# Patient Record
Sex: Female | Born: 1950
Health system: Southern US, Community
[De-identification: ages and names within clinical notes are randomized; demographics above are authoritative.]

## PROBLEM LIST (undated history)

## (undated) DIAGNOSIS — M5030 Other cervical disc degeneration, unspecified cervical region: Secondary | ICD-10-CM

## (undated) DIAGNOSIS — T8859XA Other complications of anesthesia, initial encounter: Secondary | ICD-10-CM

## (undated) DIAGNOSIS — E119 Type 2 diabetes mellitus without complications: Secondary | ICD-10-CM

## (undated) DIAGNOSIS — Z8619 Personal history of other infectious and parasitic diseases: Secondary | ICD-10-CM

## (undated) DIAGNOSIS — Z794 Long term (current) use of insulin: Secondary | ICD-10-CM

## (undated) DIAGNOSIS — G629 Polyneuropathy, unspecified: Secondary | ICD-10-CM

## (undated) DIAGNOSIS — D649 Anemia, unspecified: Secondary | ICD-10-CM

## (undated) DIAGNOSIS — Z9221 Personal history of antineoplastic chemotherapy: Secondary | ICD-10-CM

## (undated) DIAGNOSIS — I5181 Takotsubo syndrome: Secondary | ICD-10-CM

## (undated) DIAGNOSIS — F1193 Opioid use, unspecified with withdrawal: Secondary | ICD-10-CM

## (undated) DIAGNOSIS — I5030 Unspecified diastolic (congestive) heart failure: Secondary | ICD-10-CM

## (undated) DIAGNOSIS — M1990 Unspecified osteoarthritis, unspecified site: Secondary | ICD-10-CM

## (undated) DIAGNOSIS — F32A Depression, unspecified: Secondary | ICD-10-CM

## (undated) DIAGNOSIS — I6789 Other cerebrovascular disease: Secondary | ICD-10-CM

## (undated) DIAGNOSIS — R0601 Orthopnea: Secondary | ICD-10-CM

## (undated) DIAGNOSIS — N189 Chronic kidney disease, unspecified: Secondary | ICD-10-CM

## (undated) DIAGNOSIS — Z923 Personal history of irradiation: Secondary | ICD-10-CM

## (undated) DIAGNOSIS — M545 Low back pain, unspecified: Secondary | ICD-10-CM

## (undated) DIAGNOSIS — I639 Cerebral infarction, unspecified: Secondary | ICD-10-CM

## (undated) DIAGNOSIS — M419 Scoliosis, unspecified: Secondary | ICD-10-CM

## (undated) DIAGNOSIS — K5792 Diverticulitis of intestine, part unspecified, without perforation or abscess without bleeding: Secondary | ICD-10-CM

## (undated) DIAGNOSIS — J449 Chronic obstructive pulmonary disease, unspecified: Secondary | ICD-10-CM

## (undated) DIAGNOSIS — I509 Heart failure, unspecified: Secondary | ICD-10-CM

## (undated) DIAGNOSIS — Z9981 Dependence on supplemental oxygen: Secondary | ICD-10-CM

## (undated) DIAGNOSIS — K219 Gastro-esophageal reflux disease without esophagitis: Secondary | ICD-10-CM

## (undated) DIAGNOSIS — E7800 Pure hypercholesterolemia, unspecified: Secondary | ICD-10-CM

## (undated) DIAGNOSIS — N809 Endometriosis, unspecified: Secondary | ICD-10-CM

## (undated) DIAGNOSIS — G4730 Sleep apnea, unspecified: Secondary | ICD-10-CM

## (undated) DIAGNOSIS — F13239 Sedative, hypnotic or anxiolytic dependence with withdrawal, unspecified: Secondary | ICD-10-CM

## (undated) DIAGNOSIS — J40 Bronchitis, not specified as acute or chronic: Secondary | ICD-10-CM

## (undated) DIAGNOSIS — I10 Essential (primary) hypertension: Secondary | ICD-10-CM

## (undated) DIAGNOSIS — J189 Pneumonia, unspecified organism: Secondary | ICD-10-CM

## (undated) DIAGNOSIS — I4891 Unspecified atrial fibrillation: Secondary | ICD-10-CM

## (undated) DIAGNOSIS — K589 Irritable bowel syndrome without diarrhea: Secondary | ICD-10-CM

## (undated) DIAGNOSIS — J45909 Unspecified asthma, uncomplicated: Secondary | ICD-10-CM

## (undated) DIAGNOSIS — M5382 Other specified dorsopathies, cervical region: Secondary | ICD-10-CM

## (undated) DIAGNOSIS — G894 Chronic pain syndrome: Secondary | ICD-10-CM

## (undated) DIAGNOSIS — F13939 Sedative, hypnotic or anxiolytic use, unspecified with withdrawal, unspecified: Secondary | ICD-10-CM

## (undated) DIAGNOSIS — F419 Anxiety disorder, unspecified: Secondary | ICD-10-CM

## (undated) DIAGNOSIS — R0600 Dyspnea, unspecified: Secondary | ICD-10-CM

## (undated) DIAGNOSIS — C50919 Malignant neoplasm of unspecified site of unspecified female breast: Secondary | ICD-10-CM

## (undated) DIAGNOSIS — Z7901 Long term (current) use of anticoagulants: Secondary | ICD-10-CM

## (undated) DIAGNOSIS — N183 Chronic kidney disease, stage 3 unspecified: Secondary | ICD-10-CM

## (undated) DIAGNOSIS — I499 Cardiac arrhythmia, unspecified: Secondary | ICD-10-CM

## (undated) DIAGNOSIS — I480 Paroxysmal atrial fibrillation: Secondary | ICD-10-CM

## (undated) DIAGNOSIS — I700 Atherosclerosis of aorta: Secondary | ICD-10-CM

## (undated) HISTORY — PX: BACK SURGERY: SHX140

## (undated) HISTORY — DX: Irritable bowel syndrome, unspecified: K58.9

## (undated) HISTORY — PX: COCCYX REMOVAL: SHX600

## (undated) HISTORY — DX: Unspecified atrial fibrillation: I48.91

## (undated) HISTORY — DX: Unspecified osteoarthritis, unspecified site: M19.90

## (undated) HISTORY — DX: Endometriosis, unspecified: N80.9

## (undated) HISTORY — DX: Type 2 diabetes mellitus without complications: E11.9

## (undated) HISTORY — PX: INTRATHECAL PUMP IMPLANT: SHX6809

## (undated) HISTORY — DX: Gastro-esophageal reflux disease without esophagitis: K21.9

## (undated) HISTORY — PX: CARDIAC CATHETERIZATION: SHX172

## (undated) HISTORY — PX: TOTAL HIP ARTHROPLASTY: SHX124

## (undated) HISTORY — PX: ELBOW ARTHROSCOPY WITH TENDON RECONSTRUCTION: SHX5616

## (undated) HISTORY — PX: BREAST SURGERY: SHX581

## (undated) HISTORY — PX: PLANTAR FASCIA RELEASE: SHX2239

## (undated) HISTORY — PX: TRIGGER FINGER RELEASE: SHX641

---
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
## 1985-08-04 HISTORY — PX: ABDOMINAL HYSTERECTOMY: SHX81

## 1996-08-04 DIAGNOSIS — C50919 Malignant neoplasm of unspecified site of unspecified female breast: Secondary | ICD-10-CM

## 1996-08-04 DIAGNOSIS — C50911 Malignant neoplasm of unspecified site of right female breast: Secondary | ICD-10-CM

## 1996-08-04 HISTORY — DX: Malignant neoplasm of unspecified site of unspecified female breast: C50.919

## 1996-08-04 HISTORY — DX: Malignant neoplasm of unspecified site of right female breast: C50.911

## 1997-06-04 HISTORY — PX: MASTECTOMY: SHX3

## 2003-12-09 ENCOUNTER — Other Ambulatory Visit: Payer: Self-pay

## 2004-04-21 ENCOUNTER — Other Ambulatory Visit: Payer: Self-pay

## 2004-05-29 ENCOUNTER — Ambulatory Visit: Payer: Self-pay | Admitting: Physician Assistant

## 2004-06-13 ENCOUNTER — Ambulatory Visit: Payer: Self-pay | Admitting: Pain Medicine

## 2004-07-22 ENCOUNTER — Ambulatory Visit: Payer: Self-pay | Admitting: Pain Medicine

## 2004-07-22 ENCOUNTER — Inpatient Hospital Stay: Payer: Self-pay | Admitting: Pain Medicine

## 2004-08-01 ENCOUNTER — Ambulatory Visit: Payer: Self-pay | Admitting: Physician Assistant

## 2004-08-12 ENCOUNTER — Ambulatory Visit: Payer: Self-pay | Admitting: Pain Medicine

## 2004-09-05 ENCOUNTER — Ambulatory Visit: Payer: Self-pay | Admitting: Internal Medicine

## 2004-09-18 ENCOUNTER — Ambulatory Visit: Payer: Self-pay | Admitting: Pain Medicine

## 2004-09-20 ENCOUNTER — Ambulatory Visit: Payer: Self-pay | Admitting: Physician Assistant

## 2004-09-30 ENCOUNTER — Ambulatory Visit: Payer: Self-pay | Admitting: Pain Medicine

## 2004-10-14 ENCOUNTER — Ambulatory Visit: Payer: Self-pay | Admitting: Pain Medicine

## 2004-10-17 ENCOUNTER — Ambulatory Visit: Payer: Self-pay | Admitting: Physician Assistant

## 2004-10-23 ENCOUNTER — Ambulatory Visit: Payer: Self-pay | Admitting: Internal Medicine

## 2004-10-23 ENCOUNTER — Ambulatory Visit: Payer: Self-pay | Admitting: Pain Medicine

## 2004-11-05 ENCOUNTER — Ambulatory Visit: Payer: Self-pay | Admitting: Physician Assistant

## 2004-12-10 ENCOUNTER — Ambulatory Visit: Payer: Self-pay | Admitting: Physician Assistant

## 2004-12-12 ENCOUNTER — Ambulatory Visit: Payer: Self-pay | Admitting: Pain Medicine

## 2004-12-26 ENCOUNTER — Ambulatory Visit: Payer: Self-pay | Admitting: Physician Assistant

## 2005-01-29 ENCOUNTER — Ambulatory Visit: Payer: Self-pay | Admitting: Physician Assistant

## 2005-02-21 ENCOUNTER — Ambulatory Visit: Payer: Self-pay | Admitting: Physician Assistant

## 2005-03-04 ENCOUNTER — Ambulatory Visit: Payer: Self-pay | Admitting: Internal Medicine

## 2005-03-07 ENCOUNTER — Ambulatory Visit: Payer: Self-pay | Admitting: Physician Assistant

## 2005-04-08 ENCOUNTER — Ambulatory Visit: Payer: Self-pay | Admitting: Physician Assistant

## 2005-04-21 ENCOUNTER — Ambulatory Visit: Payer: Self-pay | Admitting: Pain Medicine

## 2005-05-06 ENCOUNTER — Ambulatory Visit: Payer: Self-pay | Admitting: Physician Assistant

## 2005-05-22 ENCOUNTER — Ambulatory Visit: Payer: Self-pay | Admitting: Physician Assistant

## 2005-07-03 ENCOUNTER — Ambulatory Visit: Payer: Self-pay | Admitting: Physician Assistant

## 2005-07-21 ENCOUNTER — Inpatient Hospital Stay: Payer: Self-pay | Admitting: Internal Medicine

## 2005-07-21 ENCOUNTER — Other Ambulatory Visit: Payer: Self-pay

## 2005-07-21 ENCOUNTER — Emergency Department: Payer: Self-pay | Admitting: Emergency Medicine

## 2005-08-05 ENCOUNTER — Ambulatory Visit: Payer: Self-pay | Admitting: Physician Assistant

## 2005-09-09 ENCOUNTER — Ambulatory Visit: Payer: Self-pay | Admitting: Physician Assistant

## 2005-09-10 ENCOUNTER — Ambulatory Visit: Payer: Self-pay | Admitting: Internal Medicine

## 2005-09-18 ENCOUNTER — Ambulatory Visit: Payer: Self-pay | Admitting: Physician Assistant

## 2005-10-08 ENCOUNTER — Ambulatory Visit: Payer: Self-pay | Admitting: Physician Assistant

## 2005-10-20 ENCOUNTER — Ambulatory Visit: Payer: Self-pay | Admitting: Internal Medicine

## 2005-11-05 ENCOUNTER — Ambulatory Visit: Payer: Self-pay | Admitting: Physician Assistant

## 2005-11-10 ENCOUNTER — Ambulatory Visit: Payer: Self-pay

## 2005-11-14 ENCOUNTER — Ambulatory Visit: Payer: Self-pay | Admitting: Internal Medicine

## 2005-11-26 ENCOUNTER — Ambulatory Visit: Payer: Self-pay | Admitting: Pain Medicine

## 2005-12-03 ENCOUNTER — Ambulatory Visit: Payer: Self-pay | Admitting: Pain Medicine

## 2006-01-14 ENCOUNTER — Ambulatory Visit: Payer: Self-pay | Admitting: Physician Assistant

## 2006-01-19 ENCOUNTER — Ambulatory Visit: Payer: Self-pay | Admitting: Pain Medicine

## 2006-04-20 ENCOUNTER — Ambulatory Visit: Payer: Self-pay | Admitting: Physician Assistant

## 2006-04-24 ENCOUNTER — Ambulatory Visit: Payer: Self-pay | Admitting: Internal Medicine

## 2006-05-04 ENCOUNTER — Ambulatory Visit: Payer: Self-pay | Admitting: Internal Medicine

## 2006-07-22 ENCOUNTER — Ambulatory Visit: Payer: Self-pay | Admitting: Physician Assistant

## 2006-09-03 ENCOUNTER — Ambulatory Visit: Payer: Self-pay | Admitting: Physician Assistant

## 2006-09-07 ENCOUNTER — Ambulatory Visit: Payer: Self-pay | Admitting: Internal Medicine

## 2006-10-26 ENCOUNTER — Ambulatory Visit: Payer: Self-pay | Admitting: Internal Medicine

## 2006-10-26 ENCOUNTER — Ambulatory Visit: Payer: Self-pay | Admitting: Physician Assistant

## 2006-10-27 ENCOUNTER — Ambulatory Visit: Payer: Self-pay | Admitting: Specialist

## 2006-10-27 ENCOUNTER — Other Ambulatory Visit: Payer: Self-pay

## 2006-11-06 ENCOUNTER — Ambulatory Visit: Payer: Self-pay | Admitting: Specialist

## 2007-01-01 ENCOUNTER — Ambulatory Visit: Payer: Self-pay | Admitting: Pain Medicine

## 2007-01-01 ENCOUNTER — Ambulatory Visit: Payer: Self-pay | Admitting: Physician Assistant

## 2007-01-03 ENCOUNTER — Ambulatory Visit: Payer: Self-pay | Admitting: Internal Medicine

## 2007-01-04 ENCOUNTER — Ambulatory Visit: Payer: Self-pay | Admitting: Internal Medicine

## 2007-01-07 ENCOUNTER — Ambulatory Visit: Payer: Self-pay | Admitting: Pain Medicine

## 2007-01-11 ENCOUNTER — Ambulatory Visit: Payer: Self-pay | Admitting: Pain Medicine

## 2007-01-13 ENCOUNTER — Ambulatory Visit: Payer: Self-pay | Admitting: Gastroenterology

## 2007-01-27 ENCOUNTER — Ambulatory Visit: Payer: Self-pay | Admitting: Physician Assistant

## 2007-02-02 ENCOUNTER — Ambulatory Visit: Payer: Self-pay | Admitting: Internal Medicine

## 2007-04-05 ENCOUNTER — Ambulatory Visit: Payer: Self-pay | Admitting: Internal Medicine

## 2007-05-03 ENCOUNTER — Ambulatory Visit: Payer: Self-pay | Admitting: Physician Assistant

## 2007-05-04 ENCOUNTER — Ambulatory Visit: Payer: Self-pay | Admitting: Internal Medicine

## 2007-08-05 ENCOUNTER — Ambulatory Visit: Payer: Self-pay | Admitting: Internal Medicine

## 2007-08-09 ENCOUNTER — Ambulatory Visit: Payer: Self-pay | Admitting: Physician Assistant

## 2007-08-18 ENCOUNTER — Ambulatory Visit: Payer: Self-pay | Admitting: Pain Medicine

## 2007-08-30 ENCOUNTER — Ambulatory Visit: Payer: Self-pay | Admitting: Internal Medicine

## 2007-09-05 ENCOUNTER — Ambulatory Visit: Payer: Self-pay | Admitting: Internal Medicine

## 2007-11-10 ENCOUNTER — Ambulatory Visit: Payer: Self-pay | Admitting: Physician Assistant

## 2007-11-15 ENCOUNTER — Ambulatory Visit: Payer: Self-pay | Admitting: Internal Medicine

## 2008-01-03 ENCOUNTER — Ambulatory Visit: Payer: Self-pay | Admitting: Internal Medicine

## 2008-01-07 ENCOUNTER — Ambulatory Visit: Payer: Self-pay | Admitting: Internal Medicine

## 2008-02-02 ENCOUNTER — Ambulatory Visit: Payer: Self-pay | Admitting: Internal Medicine

## 2008-02-03 ENCOUNTER — Ambulatory Visit: Payer: Self-pay | Admitting: Physician Assistant

## 2008-02-21 ENCOUNTER — Ambulatory Visit: Payer: Self-pay | Admitting: Family Medicine

## 2008-02-22 ENCOUNTER — Ambulatory Visit: Payer: Self-pay | Admitting: Family Medicine

## 2008-03-28 ENCOUNTER — Ambulatory Visit: Payer: Self-pay

## 2008-04-24 ENCOUNTER — Ambulatory Visit: Payer: Self-pay

## 2008-05-08 ENCOUNTER — Ambulatory Visit: Payer: Self-pay | Admitting: Pain Medicine

## 2008-07-04 ENCOUNTER — Ambulatory Visit: Payer: Self-pay | Admitting: Internal Medicine

## 2008-07-11 ENCOUNTER — Ambulatory Visit: Payer: Self-pay | Admitting: Internal Medicine

## 2008-08-04 ENCOUNTER — Ambulatory Visit: Payer: Self-pay | Admitting: Internal Medicine

## 2008-08-04 DIAGNOSIS — I639 Cerebral infarction, unspecified: Secondary | ICD-10-CM

## 2008-08-04 DIAGNOSIS — G459 Transient cerebral ischemic attack, unspecified: Secondary | ICD-10-CM

## 2008-08-04 HISTORY — DX: Cerebral infarction, unspecified: I63.9

## 2008-08-04 HISTORY — DX: Transient cerebral ischemic attack, unspecified: G45.9

## 2008-08-08 ENCOUNTER — Ambulatory Visit: Payer: Self-pay | Admitting: Physician Assistant

## 2008-11-08 ENCOUNTER — Ambulatory Visit: Payer: Self-pay | Admitting: Physician Assistant

## 2008-11-09 ENCOUNTER — Ambulatory Visit: Payer: Self-pay | Admitting: Internal Medicine

## 2009-01-02 ENCOUNTER — Ambulatory Visit: Payer: Self-pay | Admitting: Internal Medicine

## 2009-01-09 ENCOUNTER — Ambulatory Visit: Payer: Self-pay | Admitting: Internal Medicine

## 2009-02-08 ENCOUNTER — Ambulatory Visit: Payer: Self-pay | Admitting: Physician Assistant

## 2009-05-25 ENCOUNTER — Emergency Department: Payer: Self-pay | Admitting: Unknown Physician Specialty

## 2009-05-28 ENCOUNTER — Ambulatory Visit: Payer: Self-pay | Admitting: Pain Medicine

## 2009-05-31 ENCOUNTER — Emergency Department: Payer: Self-pay | Admitting: Emergency Medicine

## 2009-06-18 ENCOUNTER — Ambulatory Visit: Payer: Self-pay | Admitting: Pain Medicine

## 2009-08-04 HISTORY — PX: OTHER SURGICAL HISTORY: SHX169

## 2009-08-21 ENCOUNTER — Ambulatory Visit: Payer: Self-pay | Admitting: Physician Assistant

## 2009-11-12 ENCOUNTER — Ambulatory Visit: Payer: Self-pay | Admitting: Family Medicine

## 2009-11-19 ENCOUNTER — Ambulatory Visit: Payer: Self-pay | Admitting: Pain Medicine

## 2010-03-04 ENCOUNTER — Ambulatory Visit: Payer: Self-pay

## 2010-12-09 ENCOUNTER — Ambulatory Visit: Payer: Self-pay | Admitting: Pain Medicine

## 2010-12-09 ENCOUNTER — Ambulatory Visit: Payer: Self-pay

## 2010-12-16 ENCOUNTER — Ambulatory Visit: Payer: Self-pay

## 2011-07-21 ENCOUNTER — Ambulatory Visit: Payer: Self-pay

## 2011-12-19 ENCOUNTER — Ambulatory Visit: Payer: Self-pay | Admitting: Family Medicine

## 2012-01-29 ENCOUNTER — Ambulatory Visit: Payer: Self-pay | Admitting: Gastroenterology

## 2012-09-30 DIAGNOSIS — G894 Chronic pain syndrome: Secondary | ICD-10-CM | POA: Insufficient documentation

## 2012-09-30 DIAGNOSIS — M545 Low back pain, unspecified: Secondary | ICD-10-CM | POA: Insufficient documentation

## 2012-09-30 DIAGNOSIS — G8929 Other chronic pain: Secondary | ICD-10-CM | POA: Insufficient documentation

## 2012-09-30 DIAGNOSIS — M792 Neuralgia and neuritis, unspecified: Secondary | ICD-10-CM | POA: Insufficient documentation

## 2012-12-16 ENCOUNTER — Ambulatory Visit: Payer: Self-pay | Admitting: Pain Medicine

## 2013-01-05 ENCOUNTER — Ambulatory Visit: Payer: Self-pay

## 2013-07-13 ENCOUNTER — Ambulatory Visit: Payer: Self-pay

## 2013-09-14 ENCOUNTER — Emergency Department: Payer: Self-pay | Admitting: Internal Medicine

## 2013-09-14 LAB — COMPREHENSIVE METABOLIC PANEL
Albumin: 3.2 g/dL — ABNORMAL LOW (ref 3.4–5.0)
Alkaline Phosphatase: 79 U/L
Anion Gap: 7 (ref 7–16)
Bilirubin,Total: 0.5 mg/dL (ref 0.2–1.0)
Chloride: 97 mmol/L — ABNORMAL LOW (ref 98–107)
Co2: 29 mmol/L (ref 21–32)
Creatinine: 0.68 mg/dL (ref 0.60–1.30)
EGFR (African American): >60
EGFR (Non-African Amer.): >60
Osmolality: 270 (ref 275–301)
SGPT (ALT): 17 U/L (ref 12–78)
Total Protein: 8.9 g/dL — ABNORMAL HIGH (ref 6.4–8.2)

## 2013-09-14 LAB — CBC
HCT: 35.5 % (ref 35.0–47.0)
HGB: 11.3 g/dL — ABNORMAL LOW (ref 12.0–16.0)
MCH: 28.3 pg (ref 26.0–34.0)
MCHC: 31.8 g/dL — ABNORMAL LOW (ref 32.0–36.0)
MCV: 89 fL (ref 80–100)
Platelet: 449 10*3/uL — ABNORMAL HIGH (ref 150–440)
RBC: 4 10*6/uL (ref 3.80–5.20)
RDW: 14.7 % — ABNORMAL HIGH (ref 11.5–14.5)
WBC: 25.6 10*3/uL — ABNORMAL HIGH (ref 3.6–11.0)

## 2013-09-14 LAB — COMPREHENSIVE METABOLIC PANEL WITH GFR
BUN: 8 mg/dL (ref 7–18)
Calcium, Total: 9 mg/dL (ref 8.5–10.1)
Glucose: 194 mg/dL — ABNORMAL HIGH (ref 65–99)
Potassium: 3.5 mmol/L (ref 3.5–5.1)
SGOT(AST): 13 U/L — ABNORMAL LOW (ref 15–37)
Sodium: 133 mmol/L — ABNORMAL LOW (ref 136–145)

## 2013-09-14 LAB — TROPONIN I: Troponin-I: <0.02 ng/mL

## 2013-09-14 LAB — PRO B NATRIURETIC PEPTIDE: B-Type Natriuretic Peptide: 1171 pg/mL — ABNORMAL HIGH (ref 0–125)

## 2013-09-19 LAB — CULTURE, BLOOD (SINGLE)

## 2013-09-27 DIAGNOSIS — C541 Malignant neoplasm of endometrium: Secondary | ICD-10-CM | POA: Insufficient documentation

## 2013-09-27 DIAGNOSIS — Z85831 Personal history of malignant neoplasm of soft tissue: Secondary | ICD-10-CM | POA: Insufficient documentation

## 2013-11-17 ENCOUNTER — Ambulatory Visit: Payer: Self-pay | Admitting: Family Medicine

## 2013-12-02 ENCOUNTER — Ambulatory Visit: Payer: Self-pay

## 2014-01-17 ENCOUNTER — Ambulatory Visit: Payer: Self-pay

## 2014-08-09 DIAGNOSIS — J180 Bronchopneumonia, unspecified organism: Secondary | ICD-10-CM | POA: Diagnosis not present

## 2014-08-09 DIAGNOSIS — J449 Chronic obstructive pulmonary disease, unspecified: Secondary | ICD-10-CM | POA: Diagnosis not present

## 2014-09-25 DIAGNOSIS — M545 Low back pain: Secondary | ICD-10-CM | POA: Diagnosis not present

## 2014-09-25 DIAGNOSIS — Z9689 Presence of other specified functional implants: Secondary | ICD-10-CM | POA: Diagnosis not present

## 2014-09-25 DIAGNOSIS — G894 Chronic pain syndrome: Secondary | ICD-10-CM | POA: Diagnosis not present

## 2014-09-28 DIAGNOSIS — J449 Chronic obstructive pulmonary disease, unspecified: Secondary | ICD-10-CM | POA: Diagnosis not present

## 2014-10-04 DIAGNOSIS — J449 Chronic obstructive pulmonary disease, unspecified: Secondary | ICD-10-CM | POA: Diagnosis not present

## 2014-10-23 DIAGNOSIS — G4733 Obstructive sleep apnea (adult) (pediatric): Secondary | ICD-10-CM | POA: Diagnosis not present

## 2014-10-23 DIAGNOSIS — R918 Other nonspecific abnormal finding of lung field: Secondary | ICD-10-CM | POA: Diagnosis not present

## 2014-10-23 DIAGNOSIS — J449 Chronic obstructive pulmonary disease, unspecified: Secondary | ICD-10-CM | POA: Diagnosis not present

## 2014-10-28 DIAGNOSIS — B340 Adenovirus infection, unspecified: Secondary | ICD-10-CM | POA: Diagnosis not present

## 2014-11-04 DIAGNOSIS — J449 Chronic obstructive pulmonary disease, unspecified: Secondary | ICD-10-CM | POA: Diagnosis not present

## 2014-11-14 DIAGNOSIS — N183 Chronic kidney disease, stage 3 (moderate): Secondary | ICD-10-CM | POA: Diagnosis not present

## 2014-11-14 DIAGNOSIS — E1121 Type 2 diabetes mellitus with diabetic nephropathy: Secondary | ICD-10-CM | POA: Diagnosis not present

## 2014-11-14 DIAGNOSIS — J329 Chronic sinusitis, unspecified: Secondary | ICD-10-CM | POA: Diagnosis not present

## 2014-11-14 DIAGNOSIS — I129 Hypertensive chronic kidney disease with stage 1 through stage 4 chronic kidney disease, or unspecified chronic kidney disease: Secondary | ICD-10-CM | POA: Diagnosis not present

## 2014-11-14 DIAGNOSIS — E784 Other hyperlipidemia: Secondary | ICD-10-CM | POA: Diagnosis not present

## 2014-11-14 DIAGNOSIS — J449 Chronic obstructive pulmonary disease, unspecified: Secondary | ICD-10-CM | POA: Diagnosis not present

## 2014-11-16 DIAGNOSIS — N183 Chronic kidney disease, stage 3 unspecified: Secondary | ICD-10-CM

## 2014-11-16 DIAGNOSIS — E1122 Type 2 diabetes mellitus with diabetic chronic kidney disease: Secondary | ICD-10-CM

## 2014-11-16 DIAGNOSIS — I129 Hypertensive chronic kidney disease with stage 1 through stage 4 chronic kidney disease, or unspecified chronic kidney disease: Secondary | ICD-10-CM | POA: Insufficient documentation

## 2014-11-16 DIAGNOSIS — F419 Anxiety disorder, unspecified: Secondary | ICD-10-CM | POA: Insufficient documentation

## 2014-11-16 DIAGNOSIS — E669 Obesity, unspecified: Secondary | ICD-10-CM | POA: Insufficient documentation

## 2014-11-16 DIAGNOSIS — G8929 Other chronic pain: Secondary | ICD-10-CM | POA: Insufficient documentation

## 2014-11-16 DIAGNOSIS — F324 Major depressive disorder, single episode, in partial remission: Secondary | ICD-10-CM | POA: Insufficient documentation

## 2014-11-16 DIAGNOSIS — E785 Hyperlipidemia, unspecified: Secondary | ICD-10-CM

## 2014-11-16 DIAGNOSIS — J309 Allergic rhinitis, unspecified: Secondary | ICD-10-CM | POA: Insufficient documentation

## 2014-11-16 DIAGNOSIS — C50911 Malignant neoplasm of unspecified site of right female breast: Secondary | ICD-10-CM

## 2014-11-16 DIAGNOSIS — Z853 Personal history of malignant neoplasm of breast: Secondary | ICD-10-CM | POA: Insufficient documentation

## 2014-11-16 DIAGNOSIS — E1140 Type 2 diabetes mellitus with diabetic neuropathy, unspecified: Secondary | ICD-10-CM | POA: Insufficient documentation

## 2014-11-16 DIAGNOSIS — K219 Gastro-esophageal reflux disease without esophagitis: Secondary | ICD-10-CM | POA: Insufficient documentation

## 2014-11-16 DIAGNOSIS — J449 Chronic obstructive pulmonary disease, unspecified: Secondary | ICD-10-CM | POA: Insufficient documentation

## 2014-11-16 DIAGNOSIS — G4700 Insomnia, unspecified: Secondary | ICD-10-CM | POA: Insufficient documentation

## 2014-11-16 DIAGNOSIS — E1169 Type 2 diabetes mellitus with other specified complication: Secondary | ICD-10-CM | POA: Insufficient documentation

## 2014-11-16 DIAGNOSIS — E1142 Type 2 diabetes mellitus with diabetic polyneuropathy: Secondary | ICD-10-CM | POA: Insufficient documentation

## 2014-11-16 DIAGNOSIS — F332 Major depressive disorder, recurrent severe without psychotic features: Secondary | ICD-10-CM

## 2014-11-16 DIAGNOSIS — G4730 Sleep apnea, unspecified: Secondary | ICD-10-CM | POA: Insufficient documentation

## 2014-11-16 DIAGNOSIS — M549 Dorsalgia, unspecified: Secondary | ICD-10-CM

## 2014-11-16 DIAGNOSIS — N1831 Chronic kidney disease, stage 3a: Secondary | ICD-10-CM | POA: Insufficient documentation

## 2014-11-17 ENCOUNTER — Other Ambulatory Visit: Payer: Self-pay | Admitting: Unknown Physician Specialty

## 2014-11-17 ENCOUNTER — Other Ambulatory Visit: Payer: Self-pay | Admitting: Specialist

## 2014-11-17 DIAGNOSIS — Z1231 Encounter for screening mammogram for malignant neoplasm of breast: Secondary | ICD-10-CM

## 2014-11-17 DIAGNOSIS — R911 Solitary pulmonary nodule: Secondary | ICD-10-CM

## 2014-11-20 DIAGNOSIS — G894 Chronic pain syndrome: Secondary | ICD-10-CM | POA: Diagnosis not present

## 2014-12-04 DIAGNOSIS — J449 Chronic obstructive pulmonary disease, unspecified: Secondary | ICD-10-CM | POA: Diagnosis not present

## 2014-12-20 ENCOUNTER — Ambulatory Visit
Admission: RE | Admit: 2014-12-20 | Discharge: 2014-12-20 | Disposition: A | Discharge status: Home / Self Care | Payer: Commercial Managed Care - HMO | Source: Ambulatory Visit | Attending: Specialist | Admitting: Specialist

## 2015-01-04 DIAGNOSIS — J449 Chronic obstructive pulmonary disease, unspecified: Secondary | ICD-10-CM | POA: Diagnosis not present

## 2015-01-08 ENCOUNTER — Other Ambulatory Visit: Payer: Self-pay | Admitting: Unknown Physician Specialty

## 2015-01-08 ENCOUNTER — Telehealth: Payer: Self-pay | Admitting: Unknown Physician Specialty

## 2015-01-08 DIAGNOSIS — I10 Essential (primary) hypertension: Secondary | ICD-10-CM

## 2015-01-08 MED ORDER — SITAGLIPTIN PHOSPHATE 100 MG PO TABS: 100.0000 mg | ORAL_TABLET | Freq: Every day | ORAL | Status: DC

## 2015-01-08 MED ORDER — LISINOPRIL 5 MG PO TABS: 5.0000 mg | ORAL_TABLET | Freq: Every day | ORAL | Status: DC

## 2015-01-08 NOTE — Telephone Encounter (Signed)
Pt called to inform that she has changed insurances and now all med refills will have to be sent to [facility]. Pt needs the following meds:  Lisinopril [place]  Thanks.

## 2015-01-17 ENCOUNTER — Ambulatory Visit (INDEPENDENT_AMBULATORY_CARE_PROVIDER_SITE_OTHER): Payer: Commercial Managed Care - HMO

## 2015-01-17 DIAGNOSIS — E538 Deficiency of other specified B group vitamins: Secondary | ICD-10-CM | POA: Diagnosis not present

## 2015-01-17 MED ORDER — CYANOCOBALAMIN 1000 MCG/ML IJ SOLN: 1000.0000 ug | Freq: Once | INTRAMUSCULAR | Status: DC

## 2015-01-17 MED ORDER — CYANOCOBALAMIN 1000 MCG/ML IJ SOLN
1000.0000 ug | Freq: Once | INTRAMUSCULAR | Status: AC
  Administered 2015-01-17: 1000 ug via INTRAMUSCULAR

## 2015-01-22 DIAGNOSIS — G894 Chronic pain syndrome: Secondary | ICD-10-CM | POA: Diagnosis not present

## 2015-01-22 DIAGNOSIS — M791 Myalgia: Secondary | ICD-10-CM | POA: Diagnosis not present

## 2015-01-22 DIAGNOSIS — Z9689 Presence of other specified functional implants: Secondary | ICD-10-CM | POA: Diagnosis not present

## 2015-02-03 DIAGNOSIS — J449 Chronic obstructive pulmonary disease, unspecified: Secondary | ICD-10-CM | POA: Diagnosis not present

## 2015-02-08 ENCOUNTER — Other Ambulatory Visit: Payer: Self-pay | Admitting: Unknown Physician Specialty

## 2015-02-12 ENCOUNTER — Ambulatory Visit
Admission: RE | Admit: 2015-02-12 | Discharge: 2015-02-12 | Disposition: A | Discharge status: Home / Self Care | Payer: Commercial Managed Care - HMO | Source: Ambulatory Visit | Attending: Unknown Physician Specialty | Admitting: Unknown Physician Specialty

## 2015-02-12 ENCOUNTER — Encounter: Payer: Self-pay | Admitting: Family Medicine

## 2015-02-12 DIAGNOSIS — Z1231 Encounter for screening mammogram for malignant neoplasm of breast: Secondary | ICD-10-CM | POA: Diagnosis present

## 2015-02-12 DIAGNOSIS — Z9011 Acquired absence of right breast and nipple: Secondary | ICD-10-CM | POA: Diagnosis not present

## 2015-02-12 HISTORY — DX: Malignant neoplasm of unspecified site of unspecified female breast: C50.919

## 2015-02-16 ENCOUNTER — Ambulatory Visit (INDEPENDENT_AMBULATORY_CARE_PROVIDER_SITE_OTHER): Payer: Commercial Managed Care - HMO

## 2015-02-16 DIAGNOSIS — E538 Deficiency of other specified B group vitamins: Secondary | ICD-10-CM

## 2015-02-16 MED ORDER — CYANOCOBALAMIN 1000 MCG/ML IJ SOLN
1000.0000 ug | Freq: Once | INTRAMUSCULAR | Status: AC
  Administered 2015-02-16: 1000 ug via INTRAMUSCULAR

## 2015-03-06 DIAGNOSIS — J449 Chronic obstructive pulmonary disease, unspecified: Secondary | ICD-10-CM | POA: Diagnosis not present

## 2015-03-12 DIAGNOSIS — H2513 Age-related nuclear cataract, bilateral: Secondary | ICD-10-CM | POA: Diagnosis not present

## 2015-03-12 LAB — HM DIABETES EYE EXAM

## 2015-03-15 ENCOUNTER — Other Ambulatory Visit: Payer: Self-pay | Admitting: Family Medicine

## 2015-03-19 ENCOUNTER — Encounter: Payer: Self-pay | Admitting: Unknown Physician Specialty

## 2015-03-19 ENCOUNTER — Ambulatory Visit (INDEPENDENT_AMBULATORY_CARE_PROVIDER_SITE_OTHER): Payer: Commercial Managed Care - HMO | Admitting: Unknown Physician Specialty

## 2015-03-19 VITALS — BP 128/76 | HR 105 | Temp 97.6°F | Ht 60.3 in | Wt 160.8 lb

## 2015-03-19 DIAGNOSIS — E1122 Type 2 diabetes mellitus with diabetic chronic kidney disease: Secondary | ICD-10-CM | POA: Diagnosis not present

## 2015-03-19 DIAGNOSIS — M549 Dorsalgia, unspecified: Secondary | ICD-10-CM | POA: Diagnosis not present

## 2015-03-19 DIAGNOSIS — I129 Hypertensive chronic kidney disease with stage 1 through stage 4 chronic kidney disease, or unspecified chronic kidney disease: Secondary | ICD-10-CM

## 2015-03-19 DIAGNOSIS — B372 Candidiasis of skin and nail: Secondary | ICD-10-CM | POA: Diagnosis not present

## 2015-03-19 DIAGNOSIS — E538 Deficiency of other specified B group vitamins: Secondary | ICD-10-CM

## 2015-03-19 DIAGNOSIS — F419 Anxiety disorder, unspecified: Secondary | ICD-10-CM

## 2015-03-19 DIAGNOSIS — N189 Chronic kidney disease, unspecified: Secondary | ICD-10-CM | POA: Diagnosis not present

## 2015-03-19 DIAGNOSIS — J449 Chronic obstructive pulmonary disease, unspecified: Secondary | ICD-10-CM | POA: Diagnosis not present

## 2015-03-19 DIAGNOSIS — G8929 Other chronic pain: Secondary | ICD-10-CM

## 2015-03-19 LAB — BAYER DCA HB A1C WAIVED: HB A1C (BAYER DCA - WAIVED): 7.1 % — ABNORMAL HIGH (ref <7.0)

## 2015-03-19 MED ORDER — FLUCONAZOLE 150 MG PO TABS: 150.0000 mg | ORAL_TABLET | Freq: Once | ORAL | Status: DC

## 2015-03-19 MED ORDER — CLONAZEPAM 0.5 MG PO TABS: 0.5000 mg | ORAL_TABLET | Freq: Every day | ORAL | Status: DC

## 2015-03-19 MED ORDER — CYANOCOBALAMIN 1000 MCG/ML IJ SOLN
1000.0000 ug | Freq: Once | INTRAMUSCULAR | Status: AC
  Administered 2015-03-19: 1000 ug via INTRAMUSCULAR

## 2015-03-19 NOTE — Assessment & Plan Note (Signed)
Refill a limited prescription of Clonazepam

## 2015-03-19 NOTE — Assessment & Plan Note (Signed)
B12 shot given.  

## 2015-03-19 NOTE — Progress Notes (Signed)
BP 128/76 mmHg  Pulse 105  Temp(Src) 97.6 F (36.4 C)  Ht 5' 0.3" (1.532 m)  Wt 160 lb 12.8 oz (72.938 kg)  BMI 31.08 kg/m2  SpO2 93%  LMP  (LMP Unknown)   Subjective:    [name], female    DOB: 1951-07-13, 64 y.o.   MRN: [id number]  HPI: Jordan Chan is a 64 y.o. female  Chief Complaint  Patient presents with  . Diabetes  . Hypertension  . Hyperlipidemia    Relevant past medical, surgical, family and social history reviewed and updated as indicated. Interim medical history since our last visit reviewed. Allergies and medications reviewed and updated.  1.  DIABETES Hgb A1C above goal last vist at 7.4.  Returns for a 3 month f/u.     Takes 46 units at night.   Hypoglycemic episodes:  no  H8  Polydipsia/polyuria:  no  H8 Visual disturbance:  no  R2  Paresthesias:  no  R10  Glucose Monitoring:  yes daily  P1 Blod sugars 120-13- to 180 Retinal Examination:  Up to date not up to date.   P1  P1  BP and Cholesterol up to date   Needs referral to [name] following her for severe COPD  Needs referral to [place] who is following her for Morphine pump and chronic back  Review of Systems  Constitutional:       Chronic back is flaring.  Goes to the pain clinic  HENT: Negative.        Corners of mouth are burning.  Diflucan helps.  Wants a refill  Respiratory: Negative.   Cardiovascular: Negative.   Psychiatric/Behavioral:       Wants a refill of Clonazepam.  Uses it 2-3 times/week.      Per HPI unless specifically indicated above     Objective:    BP 128/76 mmHg  Pulse 105  Temp(Src) 97.6 F (36.4 C)  Ht 5' 0.3" (1.532 m)  Wt 160 lb 12.8 oz (72.938 kg)  BMI 31.08 kg/m2  SpO2 93%  LMP  (LMP Unknown)  Wt Readings from Last 3 Encounters:  03/19/15 160 lb 12.8 oz (72.938 kg)  11/16/14 159 lb (72.122 kg)    Physical Exam  Constitutional: She is oriented to person, place, and time. She appears well-developed  and well-nourished. No distress.  HENT:  Head: Normocephalic and atraumatic.  Eyes: Conjunctivae and lids are normal. Right eye exhibits no discharge. Left eye exhibits no discharge. No scleral icterus.  Cardiovascular: Normal rate, regular rhythm and normal heart sounds.   Pulmonary/Chest: Effort normal and breath sounds normal. No respiratory distress.  Abdominal: Normal appearance. There is no splenomegaly or hepatomegaly.  Musculoskeletal: Normal range of motion.  Neurological: She is alert and oriented to person, place, and time.  Skin: Skin is intact. No rash noted. No pallor.  Psychiatric: She has a normal mood and affect. Her behavior is normal. Judgment and thought content normal.     Assessment & Plan:   Problem List Items Addressed This Visit      Unprioritized   Anxiety    Refill a limited prescription of Clonazepam      Chronic back pain   Relevant Orders   Ambulatory referral to Pain Clinic   Diabetes mellitus with chronic kidney disease    Better than previous down to 7.1 from 7.4.  Increase nightime insulin until morning blood sugar is less than 120  Relevant Orders   Bayer DCA Hb A1c Waived   Comprehensive metabolic panel   Benign hypertensive renal disease    Stable.  Check cmp      B12 deficiency - Primary    B12 shot given      Relevant Medications   cyanocobalamin ((VITAMIN B-12)) injection 1,000 mcg (Completed)   Skin yeast infection    Refill Diflucan      Relevant Medications   fluconazole (DIFLUCAN) 150 MG tablet    Other Visit Diagnoses    COPD, severe        Relevant Orders    Ambulatory referral to Pulmonology        Follow up plan: Return in about 3 months (around 06/19/2015).

## 2015-03-19 NOTE — Assessment & Plan Note (Signed)
Refill Diflucan

## 2015-03-19 NOTE — Assessment & Plan Note (Addendum)
Better than previous down to 7.1 from 7.4.  Increase nightime insulin until morning blood sugar is less than 120

## 2015-03-19 NOTE — Assessment & Plan Note (Signed)
Stable. Check cmp.  ?

## 2015-03-20 LAB — COMPREHENSIVE METABOLIC PANEL
ALT: 8 IU/L (ref 0–32)
AST: 15 IU/L (ref 0–40)
Albumin/Globulin Ratio: 1 — ABNORMAL LOW (ref 1.1–2.5)
BUN: 14 mg/dL (ref 8–27)
Calcium: 9.1 mg/dL (ref 8.7–10.3)
Chloride: 99 mmol/L (ref 97–108)
GFR calc non Af Amer: 92 mL/min/{1.73_m2} (ref >59)
Globulin, Total: 3.9 g/dL (ref 1.5–4.5)
Glucose: 129 mg/dL — ABNORMAL HIGH (ref 65–99)
Potassium: 5.1 mmol/L (ref 3.5–5.2)

## 2015-03-20 LAB — COMPREHENSIVE METABOLIC PANEL WITH GFR
Albumin: 4 g/dL (ref 3.6–4.8)
Alkaline Phosphatase: 60 IU/L (ref 39–117)
BUN/Creatinine Ratio: 20 (ref 11–26)
Bilirubin Total: <0.2 mg/dL (ref 0.0–1.2)
CO2: 25 mmol/L (ref 18–29)
Creatinine, Ser: 0.7 mg/dL (ref 0.57–1.00)
GFR calc Af Amer: 106 mL/min/{1.73_m2} (ref >59)
Sodium: 138 mmol/L (ref 134–144)
Total Protein: 7.9 g/dL (ref 6.0–8.5)

## 2015-03-26 ENCOUNTER — Other Ambulatory Visit: Payer: Self-pay | Admitting: Specialist

## 2015-03-26 DIAGNOSIS — R918 Other nonspecific abnormal finding of lung field: Secondary | ICD-10-CM | POA: Diagnosis not present

## 2015-03-26 DIAGNOSIS — R911 Solitary pulmonary nodule: Secondary | ICD-10-CM

## 2015-03-26 DIAGNOSIS — J449 Chronic obstructive pulmonary disease, unspecified: Secondary | ICD-10-CM | POA: Diagnosis not present

## 2015-03-26 DIAGNOSIS — R0602 Shortness of breath: Secondary | ICD-10-CM | POA: Diagnosis not present

## 2015-03-30 ENCOUNTER — Ambulatory Visit
Admission: RE | Admit: 2015-03-30 | Discharge: 2015-03-30 | Disposition: A | Discharge status: Home / Self Care | Payer: Commercial Managed Care - HMO | Source: Ambulatory Visit | Attending: Specialist | Admitting: Specialist

## 2015-03-30 DIAGNOSIS — R911 Solitary pulmonary nodule: Secondary | ICD-10-CM | POA: Diagnosis present

## 2015-03-30 DIAGNOSIS — Z853 Personal history of malignant neoplasm of breast: Secondary | ICD-10-CM | POA: Diagnosis not present

## 2015-03-30 DIAGNOSIS — R918 Other nonspecific abnormal finding of lung field: Secondary | ICD-10-CM | POA: Insufficient documentation

## 2015-04-02 DIAGNOSIS — M791 Myalgia: Secondary | ICD-10-CM | POA: Diagnosis not present

## 2015-04-02 DIAGNOSIS — Z9689 Presence of other specified functional implants: Secondary | ICD-10-CM | POA: Diagnosis not present

## 2015-04-04 ENCOUNTER — Other Ambulatory Visit: Payer: Self-pay | Admitting: Pain Medicine

## 2015-04-04 DIAGNOSIS — Z9689 Presence of other specified functional implants: Secondary | ICD-10-CM

## 2015-04-06 DIAGNOSIS — J449 Chronic obstructive pulmonary disease, unspecified: Secondary | ICD-10-CM | POA: Diagnosis not present

## 2015-04-16 ENCOUNTER — Ambulatory Visit
Admission: RE | Admit: 2015-04-16 | Discharge: 2015-04-16 | Disposition: A | Discharge status: Home / Self Care | Payer: Commercial Managed Care - HMO | Source: Ambulatory Visit | Attending: Pain Medicine | Admitting: Pain Medicine

## 2015-04-16 DIAGNOSIS — Z9689 Presence of other specified functional implants: Secondary | ICD-10-CM

## 2015-05-06 DIAGNOSIS — J449 Chronic obstructive pulmonary disease, unspecified: Secondary | ICD-10-CM | POA: Diagnosis not present

## 2015-05-28 DIAGNOSIS — G8929 Other chronic pain: Secondary | ICD-10-CM | POA: Diagnosis not present

## 2015-05-28 DIAGNOSIS — M545 Low back pain: Secondary | ICD-10-CM | POA: Diagnosis not present

## 2015-06-06 DIAGNOSIS — J449 Chronic obstructive pulmonary disease, unspecified: Secondary | ICD-10-CM | POA: Diagnosis not present

## 2015-06-18 ENCOUNTER — Ambulatory Visit (INDEPENDENT_AMBULATORY_CARE_PROVIDER_SITE_OTHER): Payer: Commercial Managed Care - HMO | Admitting: Unknown Physician Specialty

## 2015-06-18 ENCOUNTER — Encounter: Payer: Self-pay | Admitting: Unknown Physician Specialty

## 2015-06-18 VITALS — BP 132/72 | HR 102 | Temp 98.3°F | Ht 59.0 in | Wt 160.8 lb

## 2015-06-18 DIAGNOSIS — I10 Essential (primary) hypertension: Secondary | ICD-10-CM | POA: Insufficient documentation

## 2015-06-18 DIAGNOSIS — Z23 Encounter for immunization: Secondary | ICD-10-CM | POA: Diagnosis not present

## 2015-06-18 DIAGNOSIS — N183 Chronic kidney disease, stage 3 unspecified: Secondary | ICD-10-CM

## 2015-06-18 DIAGNOSIS — E785 Hyperlipidemia, unspecified: Secondary | ICD-10-CM

## 2015-06-18 DIAGNOSIS — I152 Hypertension secondary to endocrine disorders: Secondary | ICD-10-CM | POA: Insufficient documentation

## 2015-06-18 DIAGNOSIS — N189 Chronic kidney disease, unspecified: Secondary | ICD-10-CM | POA: Diagnosis not present

## 2015-06-18 DIAGNOSIS — I129 Hypertensive chronic kidney disease with stage 1 through stage 4 chronic kidney disease, or unspecified chronic kidney disease: Secondary | ICD-10-CM | POA: Diagnosis not present

## 2015-06-18 DIAGNOSIS — E538 Deficiency of other specified B group vitamins: Secondary | ICD-10-CM

## 2015-06-18 DIAGNOSIS — Z794 Long term (current) use of insulin: Secondary | ICD-10-CM | POA: Diagnosis not present

## 2015-06-18 DIAGNOSIS — E1122 Type 2 diabetes mellitus with diabetic chronic kidney disease: Secondary | ICD-10-CM | POA: Diagnosis not present

## 2015-06-18 DIAGNOSIS — Z0000 Encounter for general adult medical examination without abnormal findings: Secondary | ICD-10-CM

## 2015-06-18 DIAGNOSIS — E1159 Type 2 diabetes mellitus with other circulatory complications: Secondary | ICD-10-CM | POA: Insufficient documentation

## 2015-06-18 LAB — LIPID PANEL PICCOLO, WAIVED
Chol/HDL Ratio Piccolo,Waive: 1.8 mg/dL
Cholesterol Piccolo, Waived: 117 mg/dL (ref <200)
HDL Chol Piccolo, Waived: 64 mg/dL (ref >59)
LDL Chol Calc Piccolo Waived: 38 mg/dL (ref <100)
Triglycerides Piccolo,Waived: 74 mg/dL (ref <150)
VLDL Chol Calc Piccolo,Waive: 15 mg/dL (ref <30)

## 2015-06-18 LAB — MICROALBUMIN, URINE WAIVED
Creatinine, Urine Waived: 50 mg/dL (ref 10–300)
Microalb, Ur Waived: 10 mg/L (ref 0–19)

## 2015-06-18 LAB — BAYER DCA HB A1C WAIVED: HB A1C (BAYER DCA - WAIVED): 7.1 % — ABNORMAL HIGH (ref <7.0)

## 2015-06-18 MED ORDER — CYANOCOBALAMIN 1000 MCG/ML IJ SOLN
1000.0000 ug | Freq: Once | INTRAMUSCULAR | Status: AC
  Administered 2015-06-18: 1000 ug via INTRAMUSCULAR

## 2015-06-18 NOTE — Assessment & Plan Note (Signed)
Hgb A1C is 7.1 and is the same as previous

## 2015-06-18 NOTE — Assessment & Plan Note (Signed)
Stable, continue present medications.   

## 2015-06-18 NOTE — Progress Notes (Signed)
BP 132/72 mmHg  Pulse 102  Temp(Src) 98.3 F (36.8 C)  Ht 4\' 11"  (1.499 m)  Wt 160 lb 12.8 oz (72.938 kg)  BMI 32.46 kg/m2  SpO2 94%  LMP  (LMP Unknown)   Subjective:    [name], female    DOB: Jun 19, 1951, 64 y.o.   MRN: [id number]  HPI: Jordan Chan is a 64 y.o. female  Chief Complaint  Patient presents with  . Diabetes  . Hyperlipidemia  . Hypertension    Relevant past medical, surgical, family and social history reviewed and updated as indicated. Interim medical history since our last visit reviewed. Allergies and medications reviewed and updated.  DIABETES Hgb A1C above goal last vist at 7.1.  Returns for a 3 month f/u.     Takes 44 units at night.   Hypoglycemic episodes:  no  H8  Polydipsia/polyuria:  no  H8 Visual disturbance:  no  R2  Paresthesias:  no  R10  Glucose Monitoring:  yes daily "all pover the place  P1 Blod sugars 120-13- to 180 Retinal Examination:  Up to date not up to date.   P1  P1  Hypertension Using medications without difficulty Average home BPs   No problems or lightheadedness No chest pain with exertion or shortness of breath No Edema   Hyperlipidemia Using medications without problems: No Muscle aches Diet compliance: good Exercise: walks  Depression/anxiety This is exacerbated with current stress of caring for estranged husband.    Depression screen [facility] 2/9 06/18/2015 06/18/2015  Decreased Interest 1 1  Down, Depressed, Hopeless 2 2  PHQ - 2 Score 3 3  Altered sleeping 2 2  Tired, decreased energy 2 2  Change in appetite 0 0  Feeling bad or failure about yourself  0 0  Trouble concentrating 2 2  Moving slowly or fidgety/restless 0 0  Suicidal thoughts 0 0  PHQ-9 Score 9 9    Needs referral to [name] following her for severe COPD  Needs referral to [place] who is following her for Morphine pump and chronic back  Review of Systems  Constitutional:       Chronic  back.  Goes to the pain clinic  HENT: Negative.        Takes Diflucan for problems with burning at the corners of her mouth  Respiratory: Negative.   Cardiovascular: Negative.   Psychiatric/Behavioral:       Wants a refill of Clonazepam.  Uses it 2-3 times/week.      Per HPI unless specifically indicated above     Objective:    BP 132/72 mmHg  Pulse 102  Temp(Src) 98.3 F (36.8 C)  Ht 4\' 11"  (1.499 m)  Wt 160 lb 12.8 oz (72.938 kg)  BMI 32.46 kg/m2  SpO2 94%  LMP  (LMP Unknown)  Wt Readings from Last 3 Encounters:  06/18/15 160 lb 12.8 oz (72.938 kg)  03/19/15 160 lb 12.8 oz (72.938 kg)  11/16/14 159 lb (72.122 kg)    Physical Exam  Constitutional: She is oriented to person, place, and time. She appears well-developed and well-nourished. No distress.  HENT:  Head: Normocephalic and atraumatic.  Eyes: Conjunctivae and lids are normal. Right eye exhibits no discharge. Left eye exhibits no discharge. No scleral icterus.  Cardiovascular: Normal rate, regular rhythm and normal heart sounds.   Pulmonary/Chest: Effort normal and breath sounds normal. No respiratory distress.  Abdominal: Normal appearance. There is no splenomegaly or hepatomegaly.  Musculoskeletal:  Normal range of motion.  Neurological: She is alert and oriented to person, place, and time.  Skin: Skin is intact. No rash noted. No pallor.  Psychiatric: She has a normal mood and affect. Her behavior is normal. Judgment and thought content normal.     Assessment & Plan:   Problem List Items Addressed This Visit      Unprioritized   Diabetes mellitus with chronic kidney disease ([place])    Hgb A1C is 7.1 and is the same as previous      Relevant Orders   Bayer DCA Hb A1c Waived   Microalbumin, Urine Waived   Chronic kidney disease, stage III (moderate)   Relevant Orders   Comprehensive metabolic panel   Microalbumin, Urine Waived   Hyperlipidemia   Relevant Orders   Comprehensive metabolic panel   Lipid  Panel Piccolo, Waived   Benign hypertension with chronic kidney disease    Stable, continue present medications.       Relevant Orders   Comprehensive metabolic panel    Other Visit Diagnoses    Immunization due    -  Primary    Relevant Orders    Flu Vaccine QUAD 36+ mos IM (Completed)    Pneumococcal conjugate vaccine 13-valent IM (Completed)    Vitamin B12 deficiency        Relevant Medications    cyanocobalamin ((VITAMIN B-12)) injection 1,000 mcg (Completed)    Routine general medical examination at a health care facility        Relevant Orders    Hepatitis C antibody    HIV antibody        Follow up plan: Return in about 3 months (around 09/18/2015).

## 2015-06-19 LAB — HIV ANTIBODY (ROUTINE TESTING W REFLEX): HIV Screen 4th Generation wRfx: NONREACTIVE

## 2015-06-19 LAB — HEPATITIS C ANTIBODY: Hep C Virus Ab: <0.1 {s_co_ratio} (ref 0.0–0.9)

## 2015-06-20 LAB — COMPREHENSIVE METABOLIC PANEL
AST: 12 IU/L (ref 0–40)
Albumin: 3.9 g/dL (ref 3.6–4.8)
BUN: 11 mg/dL (ref 8–27)
CO2: 22 mmol/L (ref 18–29)
Calcium: 9 mg/dL (ref 8.7–10.3)
Creatinine, Ser: 0.56 mg/dL — ABNORMAL LOW (ref 0.57–1.00)
GFR calc non Af Amer: 98 mL/min/{1.73_m2} (ref >59)
Globulin, Total: 3.8 g/dL (ref 1.5–4.5)
Glucose: 117 mg/dL — ABNORMAL HIGH (ref 65–99)
Sodium: 137 mmol/L (ref 136–144)

## 2015-06-20 LAB — COMPREHENSIVE METABOLIC PANEL WITH GFR
ALT: 12 IU/L (ref 0–32)
Albumin/Globulin Ratio: 1 — ABNORMAL LOW (ref 1.1–2.5)
Alkaline Phosphatase: 67 IU/L (ref 39–117)
BUN/Creatinine Ratio: 20 (ref 11–26)
Bilirubin Total: <0.2 mg/dL (ref 0.0–1.2)
Chloride: 97 mmol/L (ref 97–106)
GFR calc Af Amer: 113 mL/min/{1.73_m2} (ref >59)
Potassium: 4.8 mmol/L (ref 3.5–5.2)
Total Protein: 7.7 g/dL (ref 6.0–8.5)

## 2015-06-25 ENCOUNTER — Other Ambulatory Visit: Payer: Self-pay | Admitting: Unknown Physician Specialty

## 2015-07-06 DIAGNOSIS — J449 Chronic obstructive pulmonary disease, unspecified: Secondary | ICD-10-CM | POA: Diagnosis not present

## 2015-07-23 DIAGNOSIS — Z9689 Presence of other specified functional implants: Secondary | ICD-10-CM | POA: Diagnosis not present

## 2015-07-23 DIAGNOSIS — M545 Low back pain: Secondary | ICD-10-CM | POA: Diagnosis not present

## 2015-07-26 ENCOUNTER — Encounter: Payer: Self-pay | Admitting: Family Medicine

## 2015-07-26 ENCOUNTER — Ambulatory Visit (INDEPENDENT_AMBULATORY_CARE_PROVIDER_SITE_OTHER): Payer: Commercial Managed Care - HMO | Admitting: Family Medicine

## 2015-07-26 VITALS — BP 134/72 | HR 109 | Temp 97.7°F | Ht 59.0 in | Wt 152.8 lb

## 2015-07-26 DIAGNOSIS — N183 Chronic kidney disease, stage 3 unspecified: Secondary | ICD-10-CM

## 2015-07-26 DIAGNOSIS — J329 Chronic sinusitis, unspecified: Secondary | ICD-10-CM | POA: Insufficient documentation

## 2015-07-26 DIAGNOSIS — Z794 Long term (current) use of insulin: Secondary | ICD-10-CM | POA: Diagnosis not present

## 2015-07-26 DIAGNOSIS — E538 Deficiency of other specified B group vitamins: Secondary | ICD-10-CM | POA: Diagnosis not present

## 2015-07-26 DIAGNOSIS — E1122 Type 2 diabetes mellitus with diabetic chronic kidney disease: Secondary | ICD-10-CM

## 2015-07-26 DIAGNOSIS — J019 Acute sinusitis, unspecified: Secondary | ICD-10-CM | POA: Diagnosis not present

## 2015-07-26 MED ORDER — CYANOCOBALAMIN 1000 MCG/ML IJ SOLN
1000.0000 ug | Freq: Once | INTRAMUSCULAR | Status: AC
  Administered 2015-07-26: 1000 ug via INTRAMUSCULAR

## 2015-07-26 MED ORDER — FLUCONAZOLE 150 MG PO TABS: 150.0000 mg | ORAL_TABLET | Freq: Once | ORAL | Status: DC

## 2015-07-26 MED ORDER — AZITHROMYCIN 250 MG PO TABS: ORAL_TABLET | ORAL | Status: DC

## 2015-07-26 NOTE — Addendum Note (Signed)
Addended byGolden Pop on: 07/26/2015 10:53 AM   Modules accepted: Orders

## 2015-07-26 NOTE — Progress Notes (Signed)
BP 134/72 mmHg  Pulse 109  Temp(Src) 97.7 F (36.5 C)  Ht 4\' 11"  (1.499 m)  Wt 152 lb 12.8 oz (69.31 kg)  BMI 30.85 kg/m2  SpO2 91%  LMP  (LMP Unknown)   Subjective:    [name], female    DOB: [birth year], 64 y.o.   MRN: [id number]  HPI: Jordan Chan is a 64 y.o. female  Chief Complaint  Patient presents with  . Bronchitis    pt states she just feels bad, no energy, and has a horrible cough (productive)  . B12 Injection    pt is requesting b12 injection, please enter order if ok to give   Agent with 2 and half weeks plus of feeling like death with marked sinus congestion and facial pain or pressure terrible cough usual symptoms patient has just about all winter Spine the best in the past to Levaquin  Patient coughing all night Has problems with thrush after taking antibiotics use Diflucan successfully in the past but needs more than 1. Glucose little higher than normal gets worse when she sick of course Relevant past medical, surgical, family and social history reviewed and updated as indicated. Interim medical history since our last visit reviewed. Allergies and medications reviewed and updated.  Review of Systems  Constitutional: Positive for fever, chills, diaphoresis and fatigue.  HENT: Positive for congestion, postnasal drip, rhinorrhea, sinus pressure, sneezing and sore throat.   Respiratory: Positive for cough, choking and shortness of breath.   Cardiovascular: Negative for chest pain, palpitations and leg swelling.  Gastrointestinal: Negative for abdominal pain and abdominal distention.    Per HPI unless specifically indicated above     Objective:    BP 134/72 mmHg  Pulse 109  Temp(Src) 97.7 F (36.5 C)  Ht 4\' 11"  (1.499 m)  Wt 152 lb 12.8 oz (69.31 kg)  BMI 30.85 kg/m2  SpO2 91%  LMP  (LMP Unknown)  Wt Readings from Last 3 Encounters:  07/26/15 152 lb 12.8 oz (69.31 kg)  06/18/15 160 lb 12.8 oz (72.938 kg)  03/19/15 160  lb 12.8 oz (72.938 kg)    Physical Exam  Constitutional: She is oriented to person, place, and time. She appears well-developed and well-nourished. No distress.  HENT:  Head: Normocephalic and atraumatic.  Right Ear: Hearing and external ear normal.  Left Ear: Hearing and external ear normal.  Nose: Nose normal.  Mouth/Throat: Oropharynx is clear and moist.  Eyes: Conjunctivae and lids are normal. Right eye exhibits no discharge. Left eye exhibits no discharge. No scleral icterus.  Cardiovascular: Normal rate, regular rhythm and normal heart sounds.   Pulmonary/Chest: Effort normal. No respiratory distress. She has wheezes.  Rhonchi  Abdominal: Soft. She exhibits no distension.  Musculoskeletal: Normal range of motion.  Neurological: She is alert and oriented to person, place, and time.  Skin: Skin is intact. No rash noted.  Psychiatric: She has a normal mood and affect. Her speech is normal and behavior is normal. Judgment and thought content normal. Cognition and memory are normal.    Results for orders placed or performed in visit on 06/18/15  Comprehensive metabolic panel  Result Value Ref Range   Glucose 117 (H) 65 - 99 mg/dL   BUN 11 8 - 27 mg/dL   Creatinine, Ser 0.56 (L) 0.57 - 1.00 mg/dL   GFR calc non Af Amer 98 >59 mL/min/1.73   GFR calc Af Amer 113 >59 mL/min/1.73   BUN/Creatinine Ratio 20 11 - 26  Sodium 137 136 - 144 mmol/L   Potassium 4.8 3.5 - 5.2 mmol/L   Chloride 97 97 - 106 mmol/L   CO2 22 18 - 29 mmol/L   Calcium 9.0 8.7 - 10.3 mg/dL   Total Protein 7.7 6.0 - 8.5 g/dL   Albumin 3.9 3.6 - 4.8 g/dL   Globulin, Total 3.8 1.5 - 4.5 g/dL   Albumin/Globulin Ratio 1.0 (L) 1.1 - 2.5   Bilirubin Total <0.2 0.0 - 1.2 mg/dL   Alkaline Phosphatase 67 39 - 117 IU/L   AST 12 0 - 40 IU/L   ALT 12 0 - 32 IU/L  Lipid Panel Piccolo, Waived  Result Value Ref Range   Cholesterol Piccolo, Waived 117 <200 mg/dL   HDL Chol Piccolo, Waived 64 >59 mg/dL   Triglycerides  Piccolo,Waived 74 <150 mg/dL   Chol/HDL Ratio Piccolo,Waive 1.8 mg/dL   LDL Chol Calc Piccolo Waived 38 <100 mg/dL   VLDL Chol Calc Piccolo,Waive 15 <30 mg/dL  Bayer DCA Hb A1c Waived  Result Value Ref Range   Bayer DCA Hb A1c Waived 7.1 (H) <7.0 %  Microalbumin, Urine Waived  Result Value Ref Range   Microalb, Ur Waived 10 0 - 19 mg/L   Creatinine, Urine Waived 50 10 - 300 mg/dL   Microalb/Creat Ratio 30-300 (H) <30 mg/g  Hepatitis C antibody  Result Value Ref Range   Hep C Virus Ab <0.1 0.0 - 0.9 s/co ratio  HIV antibody  Result Value Ref Range   HIV Screen 4th Generation wRfx Non Reactive Non Reactive      Assessment & Plan:   Problem List Items Addressed This Visit      Respiratory   Sinusitis - Primary    Discussed care and treatment of sinusitis bronchitis we'll give Z-Pak patient will use Mucinex Mucinex D Tylenol cold and sinus etc.          Follow up plan: No Follow-up on file.

## 2015-07-26 NOTE — Assessment & Plan Note (Signed)
Continue current care with extra care with diet exercise while sick

## 2015-07-26 NOTE — Assessment & Plan Note (Signed)
Discussed care and treatment of sinusitis bronchitis we'll give Z-Pak patient will use Mucinex Mucinex D Tylenol cold and sinus etc.

## 2015-07-30 ENCOUNTER — Other Ambulatory Visit: Payer: Self-pay | Admitting: Unknown Physician Specialty

## 2015-08-06 DIAGNOSIS — J449 Chronic obstructive pulmonary disease, unspecified: Secondary | ICD-10-CM | POA: Diagnosis not present

## 2015-08-29 ENCOUNTER — Other Ambulatory Visit: Payer: Self-pay | Admitting: Unknown Physician Specialty

## 2015-09-04 ENCOUNTER — Other Ambulatory Visit: Payer: Self-pay | Admitting: Unknown Physician Specialty

## 2015-09-04 ENCOUNTER — Ambulatory Visit (INDEPENDENT_AMBULATORY_CARE_PROVIDER_SITE_OTHER): Payer: Commercial Managed Care - HMO

## 2015-09-04 DIAGNOSIS — E538 Deficiency of other specified B group vitamins: Secondary | ICD-10-CM

## 2015-09-04 MED ORDER — CYANOCOBALAMIN 1000 MCG/ML IJ SOLN
1000.0000 ug | INTRAMUSCULAR | Status: DC
  Administered 2015-09-04 – 2016-05-20 (×8): 1000 ug via INTRAMUSCULAR

## 2015-09-06 DIAGNOSIS — J449 Chronic obstructive pulmonary disease, unspecified: Secondary | ICD-10-CM | POA: Diagnosis not present

## 2015-09-07 ENCOUNTER — Telehealth: Payer: Self-pay | Admitting: Unknown Physician Specialty

## 2015-09-07 DIAGNOSIS — J42 Unspecified chronic bronchitis: Secondary | ICD-10-CM

## 2015-09-07 DIAGNOSIS — G8929 Other chronic pain: Secondary | ICD-10-CM

## 2015-09-07 NOTE — Telephone Encounter (Signed)
Called and let patient know that referrals were entered.

## 2015-09-07 NOTE — Telephone Encounter (Signed)
Routing to provider  

## 2015-09-07 NOTE — Telephone Encounter (Signed)
Pt called stated she needs referrals for the following:  [facility]- [facility] Kandace Blitz [facility]  Thanks.

## 2015-09-24 ENCOUNTER — Ambulatory Visit: Payer: Commercial Managed Care - HMO | Admitting: Unknown Physician Specialty

## 2015-09-24 DIAGNOSIS — G894 Chronic pain syndrome: Secondary | ICD-10-CM | POA: Diagnosis not present

## 2015-09-24 DIAGNOSIS — Z9689 Presence of other specified functional implants: Secondary | ICD-10-CM | POA: Diagnosis not present

## 2015-09-28 ENCOUNTER — Other Ambulatory Visit: Payer: Self-pay | Admitting: Family Medicine

## 2015-09-28 NOTE — Telephone Encounter (Signed)
Your patient 

## 2015-10-01 ENCOUNTER — Ambulatory Visit (INDEPENDENT_AMBULATORY_CARE_PROVIDER_SITE_OTHER): Payer: Commercial Managed Care - HMO | Admitting: Unknown Physician Specialty

## 2015-10-01 ENCOUNTER — Encounter: Payer: Self-pay | Admitting: Unknown Physician Specialty

## 2015-10-01 VITALS — BP 124/70 | HR 99 | Temp 98.0°F | Ht <= 58 in | Wt 154.0 lb

## 2015-10-01 DIAGNOSIS — G4734 Idiopathic sleep related nonobstructive alveolar hypoventilation: Secondary | ICD-10-CM | POA: Insufficient documentation

## 2015-10-01 DIAGNOSIS — E538 Deficiency of other specified B group vitamins: Secondary | ICD-10-CM

## 2015-10-01 DIAGNOSIS — N183 Chronic kidney disease, stage 3 unspecified: Secondary | ICD-10-CM

## 2015-10-01 DIAGNOSIS — R0609 Other forms of dyspnea: Secondary | ICD-10-CM | POA: Diagnosis not present

## 2015-10-01 DIAGNOSIS — E785 Hyperlipidemia, unspecified: Secondary | ICD-10-CM | POA: Diagnosis not present

## 2015-10-01 DIAGNOSIS — E0822 Diabetes mellitus due to underlying condition with diabetic chronic kidney disease: Secondary | ICD-10-CM | POA: Diagnosis not present

## 2015-10-01 DIAGNOSIS — I129 Hypertensive chronic kidney disease with stage 1 through stage 4 chronic kidney disease, or unspecified chronic kidney disease: Secondary | ICD-10-CM

## 2015-10-01 DIAGNOSIS — N189 Chronic kidney disease, unspecified: Secondary | ICD-10-CM

## 2015-10-01 DIAGNOSIS — Z794 Long term (current) use of insulin: Secondary | ICD-10-CM

## 2015-10-01 DIAGNOSIS — R918 Other nonspecific abnormal finding of lung field: Secondary | ICD-10-CM | POA: Diagnosis not present

## 2015-10-01 DIAGNOSIS — J449 Chronic obstructive pulmonary disease, unspecified: Secondary | ICD-10-CM | POA: Diagnosis not present

## 2015-10-01 LAB — LIPID PANEL PICCOLO, WAIVED
Chol/HDL Ratio Piccolo,Waive: 1.9 mg/dL
Cholesterol Piccolo, Waived: 132 mg/dL (ref <200)
HDL Chol Piccolo, Waived: 68 mg/dL (ref >59)
LDL Chol Calc Piccolo Waived: 48 mg/dL (ref <100)
Triglycerides Piccolo,Waived: 79 mg/dL (ref <150)
VLDL Chol Calc Piccolo,Waive: 16 mg/dL (ref <30)

## 2015-10-01 LAB — BAYER DCA HB A1C WAIVED: HB A1C (BAYER DCA - WAIVED): 7.4 % — ABNORMAL HIGH (ref <7.0)

## 2015-10-01 MED ORDER — ATORVASTATIN CALCIUM 20 MG PO TABS: 20.0000 mg | ORAL_TABLET | Freq: Every day | ORAL | Status: DC

## 2015-10-01 NOTE — Assessment & Plan Note (Addendum)
ASCVD calculator used and high intensity statin recommended. Start on Atorvastatin 20mg .

## 2015-10-01 NOTE — Assessment & Plan Note (Addendum)
Today's A1c is 7.4. Up from 7.1 at visit 3 months ago. Patient has been able to titrate insulin to achieve goal BS. Continue present medication.

## 2015-10-01 NOTE — Progress Notes (Signed)
BP 124/70 mmHg  Pulse 99  Temp(Src) 98 F (36.7 C)  Ht 4' 9.9" (1.471 m)  Wt 154 lb (69.854 kg)  BMI 32.28 kg/m2  SpO2 92%  LMP  (LMP Unknown)   Subjective:    [name], female    DOB: 11-16-1950, 65 y.o.   MRN: [id number]  HPI: Jordan Chan is a 65 y.o. female  Chief Complaint  Patient presents with  . Diabetes  . Hyperlipidemia  . Hypertension  . B12 Injection    pt states she would like b12 injection today   Diabetes:  Using medications without difficulties. Last A1c 7.1. Takes 35 units insulin at bedtime. Titrates based on sugars. Sometimes will take up to 44 units at bedtime. No hypoglycemic episodes No hyperglycemic episodes Feet problems: denies Blood Sugars averaging: 135-140 Eye exam within last year: yes, 03/12/15  Hypertension:  Using medications without difficulty Average home BPs: doesn't take  Using medication without problems or lightheadedness: only when she stand up or turns around too fast No chest pain with exertion.  No Edema  Elevated Cholesterol: Not currently on medication Diet compliance: home cooked meals, low carbs Exercise: walks 3-4 days a week    Relevant past medical, surgical, family and social history reviewed and updated as indicated. Interim medical history since our last visit reviewed. Allergies and medications reviewed and updated.  Review of Systems  Constitutional: Negative.   HENT: Negative.   Eyes: Negative.   Cardiovascular: Negative.   Gastrointestinal: Negative.   Endocrine: Negative.   Genitourinary: Negative.   Musculoskeletal: Negative.   Skin: Negative.   Allergic/Immunologic: Negative.   Neurological: Negative.   Hematological: Negative.   Psychiatric/Behavioral: Negative.     Per HPI unless specifically indicated above     Objective:    BP 124/70 mmHg  Pulse 99  Temp(Src) 98 F (36.7 C)  Ht 4' 9.9" (1.471 m)  Wt 154 lb (69.854 kg)  BMI 32.28 kg/m2  SpO2 92%  LMP   (LMP Unknown)  Wt Readings from Last 3 Encounters:  10/01/15 154 lb (69.854 kg)  07/26/15 152 lb 12.8 oz (69.31 kg)  06/18/15 160 lb 12.8 oz (72.938 kg)    Physical Exam  Constitutional: She is oriented to person, place, and time. She appears well-developed and well-nourished. No distress.  HENT:  Head: Normocephalic and atraumatic.  Eyes: Conjunctivae and lids are normal. Right eye exhibits no discharge. Left eye exhibits no discharge. No scleral icterus.  Neck: Normal range of motion. Neck supple. No JVD present. Carotid bruit is not present.  Cardiovascular: Normal rate, regular rhythm and normal heart sounds.   Pulmonary/Chest: Effort normal and breath sounds normal.  Abdominal: Normal appearance. There is no splenomegaly or hepatomegaly.  Musculoskeletal: Normal range of motion.  Neurological: She is alert and oriented to person, place, and time.  Skin: Skin is warm, dry and intact. No rash noted. No pallor.  Psychiatric: She has a normal mood and affect. Her behavior is normal. Judgment and thought content normal.    Results for orders placed or performed in visit on 06/18/15  Comprehensive metabolic panel  Result Value Ref Range   Glucose 117 (H) 65 - 99 mg/dL   BUN 11 8 - 27 mg/dL   Creatinine, Ser 0.56 (L) 0.57 - 1.00 mg/dL   GFR calc non Af Amer 98 >59 mL/min/1.73   GFR calc Af Amer 113 >59 mL/min/1.73   BUN/Creatinine Ratio 20 11 - 26   Sodium 137  136 - 144 mmol/L   Potassium 4.8 3.5 - 5.2 mmol/L   Chloride 97 97 - 106 mmol/L   CO2 22 18 - 29 mmol/L   Calcium 9.0 8.7 - 10.3 mg/dL   Total Protein 7.7 6.0 - 8.5 g/dL   Albumin 3.9 3.6 - 4.8 g/dL   Globulin, Total 3.8 1.5 - 4.5 g/dL   Albumin/Globulin Ratio 1.0 (L) 1.1 - 2.5   Bilirubin Total <0.2 0.0 - 1.2 mg/dL   Alkaline Phosphatase 67 39 - 117 IU/L   AST 12 0 - 40 IU/L   ALT 12 0 - 32 IU/L  Lipid Panel Piccolo, Waived  Result Value Ref Range   Cholesterol Piccolo, Waived 117 <200 mg/dL   HDL Chol Piccolo,  Waived 64 >59 mg/dL   Triglycerides Piccolo,Waived 74 <150 mg/dL   Chol/HDL Ratio Piccolo,Waive 1.8 mg/dL   LDL Chol Calc Piccolo Waived 38 <100 mg/dL   VLDL Chol Calc Piccolo,Waive 15 <30 mg/dL  Bayer DCA Hb A1c Waived  Result Value Ref Range   Bayer DCA Hb A1c Waived 7.1 (H) <7.0 %  Microalbumin, Urine Waived  Result Value Ref Range   Microalb, Ur Waived 10 0 - 19 mg/L   Creatinine, Urine Waived 50 10 - 300 mg/dL   Microalb/Creat Ratio 30-300 (H) <30 mg/g  Hepatitis C antibody  Result Value Ref Range   Hep C Virus Ab <0.1 0.0 - 0.9 s/co ratio  HIV antibody  Result Value Ref Range   HIV Screen 4th Generation wRfx Non Reactive Non Reactive      Assessment & Plan:   Problem List Items Addressed This Visit      Unprioritized   Diabetes mellitus with chronic kidney disease ([place]) - Primary    Today's A1c is 7.4. Up from 7.1 at visit 3 months ago. Patient has been able to titrate insulin to achieve goal BS. Continue present medication.       Relevant Medications   atorvastatin (LIPITOR) 20 MG tablet   Other Relevant Orders   Comprehensive metabolic panel   Bayer DCA Hb A1c Waived   Hyperlipidemia    ASCVD calculator used and high intensity statin recommended. Start on Atorvastatin 20mg .       Relevant Medications   atorvastatin (LIPITOR) 20 MG tablet   Other Relevant Orders   Lipid Panel Piccolo, Waived   Benign hypertension with chronic kidney disease    Stable, continue present medications.        Relevant Medications   atorvastatin (LIPITOR) 20 MG tablet   Other Relevant Orders   Comprehensive metabolic panel       Follow up plan: Return in about 3 months (around 12/29/2015).

## 2015-10-01 NOTE — Assessment & Plan Note (Addendum)
Stable, continue present medications.   

## 2015-10-02 LAB — COMPREHENSIVE METABOLIC PANEL
ALT: 9 IU/L (ref 0–32)
AST: 16 IU/L (ref 0–40)
Alkaline Phosphatase: 73 IU/L (ref 39–117)
BUN: 12 mg/dL (ref 8–27)
Bilirubin Total: 0.3 mg/dL (ref 0.0–1.2)
CO2: 24 mmol/L (ref 18–29)
Chloride: 98 mmol/L (ref 96–106)
Creatinine, Ser: 0.68 mg/dL (ref 0.57–1.00)
Glucose: 121 mg/dL — ABNORMAL HIGH (ref 65–99)

## 2015-10-02 LAB — COMPREHENSIVE METABOLIC PANEL WITH GFR
Albumin/Globulin Ratio: 0.9 — ABNORMAL LOW (ref 1.1–2.5)
Albumin: 3.8 g/dL (ref 3.6–4.8)
BUN/Creatinine Ratio: 18 (ref 11–26)
Calcium: 9.2 mg/dL (ref 8.7–10.3)
GFR calc Af Amer: 106 mL/min/{1.73_m2} (ref >59)
GFR calc non Af Amer: 92 mL/min/{1.73_m2} (ref >59)
Globulin, Total: 4.1 g/dL (ref 1.5–4.5)
Potassium: 5.6 mmol/L — ABNORMAL HIGH (ref 3.5–5.2)
Sodium: 135 mmol/L (ref 134–144)
Total Protein: 7.9 g/dL (ref 6.0–8.5)

## 2015-10-04 DIAGNOSIS — J449 Chronic obstructive pulmonary disease, unspecified: Secondary | ICD-10-CM | POA: Diagnosis not present

## 2015-10-09 ENCOUNTER — Other Ambulatory Visit: Payer: Self-pay | Admitting: Unknown Physician Specialty

## 2015-11-04 DIAGNOSIS — J449 Chronic obstructive pulmonary disease, unspecified: Secondary | ICD-10-CM | POA: Diagnosis not present

## 2015-11-12 ENCOUNTER — Ambulatory Visit (INDEPENDENT_AMBULATORY_CARE_PROVIDER_SITE_OTHER): Payer: Commercial Managed Care - HMO

## 2015-11-12 DIAGNOSIS — E538 Deficiency of other specified B group vitamins: Secondary | ICD-10-CM

## 2015-11-12 NOTE — Assessment & Plan Note (Signed)
Injection

## 2015-11-26 DIAGNOSIS — M545 Low back pain: Secondary | ICD-10-CM | POA: Diagnosis not present

## 2015-12-04 ENCOUNTER — Other Ambulatory Visit: Payer: Self-pay | Admitting: Unknown Physician Specialty

## 2015-12-04 DIAGNOSIS — J449 Chronic obstructive pulmonary disease, unspecified: Secondary | ICD-10-CM | POA: Diagnosis not present

## 2016-01-03 ENCOUNTER — Ambulatory Visit (INDEPENDENT_AMBULATORY_CARE_PROVIDER_SITE_OTHER): Payer: Commercial Managed Care - HMO

## 2016-01-03 DIAGNOSIS — E538 Deficiency of other specified B group vitamins: Secondary | ICD-10-CM | POA: Diagnosis not present

## 2016-01-04 DIAGNOSIS — J449 Chronic obstructive pulmonary disease, unspecified: Secondary | ICD-10-CM | POA: Diagnosis not present

## 2016-01-07 ENCOUNTER — Ambulatory Visit: Payer: Commercial Managed Care - HMO | Admitting: Unknown Physician Specialty

## 2016-01-17 ENCOUNTER — Other Ambulatory Visit: Payer: Self-pay | Admitting: Unknown Physician Specialty

## 2016-01-17 DIAGNOSIS — Z1231 Encounter for screening mammogram for malignant neoplasm of breast: Secondary | ICD-10-CM

## 2016-01-21 ENCOUNTER — Encounter: Payer: Self-pay | Admitting: Unknown Physician Specialty

## 2016-01-21 ENCOUNTER — Ambulatory Visit (INDEPENDENT_AMBULATORY_CARE_PROVIDER_SITE_OTHER): Payer: Commercial Managed Care - HMO | Admitting: Unknown Physician Specialty

## 2016-01-21 VITALS — BP 133/78 | HR 89 | Temp 98.0°F | Ht <= 58 in | Wt 151.4 lb

## 2016-01-21 DIAGNOSIS — E785 Hyperlipidemia, unspecified: Secondary | ICD-10-CM

## 2016-01-21 DIAGNOSIS — N189 Chronic kidney disease, unspecified: Secondary | ICD-10-CM | POA: Diagnosis not present

## 2016-01-21 DIAGNOSIS — Z794 Long term (current) use of insulin: Secondary | ICD-10-CM

## 2016-01-21 DIAGNOSIS — E0822 Diabetes mellitus due to underlying condition with diabetic chronic kidney disease: Secondary | ICD-10-CM | POA: Diagnosis not present

## 2016-01-21 DIAGNOSIS — I129 Hypertensive chronic kidney disease with stage 1 through stage 4 chronic kidney disease, or unspecified chronic kidney disease: Secondary | ICD-10-CM

## 2016-01-21 DIAGNOSIS — R29890 Loss of height: Secondary | ICD-10-CM | POA: Insufficient documentation

## 2016-01-21 DIAGNOSIS — N183 Chronic kidney disease, stage 3 (moderate): Secondary | ICD-10-CM

## 2016-01-21 DIAGNOSIS — E1122 Type 2 diabetes mellitus with diabetic chronic kidney disease: Secondary | ICD-10-CM | POA: Diagnosis not present

## 2016-01-21 LAB — MICROALBUMIN, URINE WAIVED
Creatinine, Urine Waived: 50 mg/dL (ref 10–300)
Microalb, Ur Waived: 10 mg/L (ref 0–19)
Microalb/Creat Ratio: <30 mg/g (ref <30)

## 2016-01-21 LAB — BAYER DCA HB A1C WAIVED: HB A1C (BAYER DCA - WAIVED): 7.4 % — ABNORMAL HIGH (ref <7.0)

## 2016-01-21 NOTE — Assessment & Plan Note (Signed)
Schedule bone density.    

## 2016-01-21 NOTE — Assessment & Plan Note (Signed)
Stable, continue present medications.   

## 2016-01-21 NOTE — Assessment & Plan Note (Signed)
Check cholesterol.

## 2016-01-21 NOTE — Progress Notes (Signed)
+++--------------------+++++++++++++++++++++++++++------+  BP 133/78 mmHg  Pulse 89  Temp(Src) 98 F (36.7 C)  Ht 4\' 10"  (1.473 m)  Wt 151 lb 6.4 oz (68.675 kg)  BMI 31.65 kg/m2  SpO2 96%  LMP  (LMP Unknown)   Subjective:    [name], female    DOB: 04-Aug-1951, 65 y.o.   MRN: [id number]  HPI: Jordan Chan is a 65 y.o. female  Chief Complaint  Patient presents with  . Diabetes  . Hyperlipidemia  . Hypertension   Diabetes: Using medications without difficulties Taking 35 units units of Levimir No hypoglycemic episodes No hyperglycemic episodes Feet problems: none Blood Sugars averaging: 120 and above eye exam within last year Last Hgb A1C: 7.4  Hypertension  Using medications without difficulty Average home BPs   Using medication without problems or lightheadedness No chest pain with exertion or shortness of breath No Edema  Elevated Cholesterol Using medications without problems No Muscle aches  Diet:  Exercise:  States she is "shrinking" and lost 3 inches.      Relevant past medical, surgical, family and social history reviewed and updated as indicated. Interim medical history since our last visit reviewed. Allergies and medications reviewed and updated.  Review of Systems  Per HPI unless specifically indicated above     Objective:    BP 133/78 mmHg  Pulse 89  Temp(Src) 98 F (36.7 C)  Ht 4\' 10"  (1.473 m)  Wt 151 lb 6.4 oz (68.675 kg)  BMI 31.65 kg/m2  SpO2 96%  LMP  (LMP Unknown)  Wt Readings from Last 3 Encounters:  01/21/16 151 lb 6.4 oz (68.675 kg)  10/01/15 154 lb (69.854 kg)  07/26/15 152 lb 12.8 oz (69.31 kg)    Physical Exam  Constitutional: She is oriented to person, place, and time. She appears well-developed and well-nourished. No distress.  HENT:  Head: Normocephalic and atraumatic.  Eyes: Conjunctivae and lids are normal. Right eye exhibits no discharge. Left eye exhibits no discharge. No scleral  icterus.  Neck: Normal range of motion. Neck supple. No JVD present. Carotid bruit is not present.  Cardiovascular: Normal rate, regular rhythm and normal heart sounds.   Pulmonary/Chest: Effort normal and breath sounds normal.  Abdominal: Normal appearance. There is no splenomegaly or hepatomegaly.  Musculoskeletal: Normal range of motion.  Neurological: She is alert and oriented to person, place, and time.  Skin: Skin is warm, dry and intact. No rash noted. No pallor.  Psychiatric: She has a normal mood and affect. Her behavior is normal. Judgment and thought content normal.    Results for orders placed or performed in visit on 10/01/15  Comprehensive metabolic panel  Result Value Ref Range   Glucose 121 (H) 65 - 99 mg/dL   BUN 12 8 - 27 mg/dL   Creatinine, Ser 0.68 0.57 - 1.00 mg/dL   GFR calc non Af Amer 92 >59 mL/min/1.73   GFR calc Af Amer 106 >59 mL/min/1.73   BUN/Creatinine Ratio 18 11 - 26   Sodium 135 134 - 144 mmol/L   Potassium 5.6 (H) 3.5 - 5.2 mmol/L   Chloride 98 96 - 106 mmol/L   CO2 24 18 - 29 mmol/L   Calcium 9.2 8.7 - 10.3 mg/dL   Total Protein 7.9 6.0 - 8.5 g/dL   Albumin 3.8 3.6 - 4.8 g/dL   Globulin, Total 4.1 1.5 - 4.5 g/dL   Albumin/Globulin Ratio 0.9 (L) 1.1 - 2.5   Bilirubin Total 0.3 0.0 - 1.2 mg/dL  Alkaline Phosphatase 73 39 - 117 IU/L   AST 16 0 - 40 IU/L   ALT 9 0 - 32 IU/L  Bayer DCA Hb A1c Waived  Result Value Ref Range   Bayer DCA Hb A1c Waived 7.4 (H) <7.0 %  Lipid Panel Piccolo, Waived  Result Value Ref Range   Cholesterol Piccolo, Waived 132 <200 mg/dL   HDL Chol Piccolo, Waived 68 >59 mg/dL   Triglycerides Piccolo,Waived 79 <150 mg/dL   Chol/HDL Ratio Piccolo,Waive 1.9 mg/dL   LDL Chol Calc Piccolo Waived 48 <100 mg/dL   VLDL Chol Calc Piccolo,Waive 16 <30 mg/dL      Assessment & Plan:   Problem List Items Addressed This Visit      Unprioritized   Benign hypertension with chronic kidney disease    Stable, continue present  medications.        Relevant Orders   Lipid Panel w/o Chol/HDL Ratio   Comprehensive metabolic panel   Diabetes mellitus with chronic kidney disease (HCC) - Primary    Hgb A1C is 7.4.  Encouraged to increase insulin.  Instructions given      Relevant Orders   Microalbumin, Urine Waived   Bayer DCA Hb A1c Waived   Comprehensive metabolic panel   Height loss    Schedule bone density      Relevant Orders   DG Bone Density   Hyperlipidemia    Check cholesterol          Follow up plan: Return in about 3 months (around 04/22/2016).

## 2016-01-21 NOTE — Patient Instructions (Signed)
Please do call to schedule your mammogram and bone density; the number to schedule one at either [facility] or [facility] is [phone]  Base your long acting insulin on your fasting (usually in the morning) blood sugar.  Increase long acting (daily insulin) 2 units if fasting blood sugar is greater than 120.  Decrease by 2 units if fasting blood sugar is less than 95.

## 2016-01-21 NOTE — Assessment & Plan Note (Signed)
Hgb A1C is 7.4.  Encouraged to increase insulin.  Instructions given

## 2016-01-22 LAB — COMPREHENSIVE METABOLIC PANEL
ALT: 10 IU/L (ref 0–32)
AST: 12 IU/L (ref 0–40)
Albumin/Globulin Ratio: 1 — ABNORMAL LOW (ref 1.2–2.2)
Albumin: 4 g/dL (ref 3.6–4.8)
Alkaline Phosphatase: 67 IU/L (ref 39–117)
BUN/Creatinine Ratio: 22 (ref 12–28)
BUN: 13 mg/dL (ref 8–27)
Bilirubin Total: 0.2 mg/dL (ref 0.0–1.2)
CO2: 27 mmol/L (ref 18–29)
Calcium: 9.4 mg/dL (ref 8.7–10.3)
Chloride: 96 mmol/L (ref 96–106)
Creatinine, Ser: 0.6 mg/dL (ref 0.57–1.00)
GFR calc Af Amer: 111 mL/min/{1.73_m2} (ref >59)
GFR calc non Af Amer: 96 mL/min/{1.73_m2} (ref >59)
Globulin, Total: 3.9 g/dL (ref 1.5–4.5)
Glucose: 150 mg/dL — ABNORMAL HIGH (ref 65–99)
Potassium: 5.1 mmol/L (ref 3.5–5.2)
Sodium: 137 mmol/L (ref 134–144)
Total Protein: 7.9 g/dL (ref 6.0–8.5)

## 2016-01-22 LAB — LIPID PANEL W/O CHOL/HDL RATIO
Cholesterol, Total: 119 mg/dL (ref 100–199)
HDL: 59 mg/dL (ref >39)
LDL Calculated: 48 mg/dL (ref 0–99)
Triglycerides: 60 mg/dL (ref 0–149)
VLDL Cholesterol Cal: 12 mg/dL (ref 5–40)

## 2016-01-28 DIAGNOSIS — Z79899 Other long term (current) drug therapy: Secondary | ICD-10-CM | POA: Diagnosis not present

## 2016-01-28 DIAGNOSIS — G894 Chronic pain syndrome: Secondary | ICD-10-CM | POA: Diagnosis not present

## 2016-01-28 DIAGNOSIS — Z9689 Presence of other specified functional implants: Secondary | ICD-10-CM | POA: Diagnosis not present

## 2016-02-03 DIAGNOSIS — J449 Chronic obstructive pulmonary disease, unspecified: Secondary | ICD-10-CM | POA: Diagnosis not present

## 2016-02-04 ENCOUNTER — Ambulatory Visit (INDEPENDENT_AMBULATORY_CARE_PROVIDER_SITE_OTHER): Payer: Commercial Managed Care - HMO

## 2016-02-04 DIAGNOSIS — E538 Deficiency of other specified B group vitamins: Secondary | ICD-10-CM | POA: Diagnosis not present

## 2016-02-05 ENCOUNTER — Other Ambulatory Visit: Payer: Self-pay | Admitting: Unknown Physician Specialty

## 2016-02-11 ENCOUNTER — Other Ambulatory Visit: Payer: Self-pay | Admitting: Unknown Physician Specialty

## 2016-02-18 ENCOUNTER — Ambulatory Visit: Payer: Commercial Managed Care - HMO

## 2016-02-18 ENCOUNTER — Other Ambulatory Visit: Payer: Self-pay | Admitting: Unknown Physician Specialty

## 2016-02-25 ENCOUNTER — Encounter: Payer: Self-pay | Admitting: Unknown Physician Specialty

## 2016-02-25 ENCOUNTER — Ambulatory Visit
Admission: RE | Admit: 2016-02-25 | Discharge: 2016-02-25 | Disposition: A | Discharge status: Home / Self Care | Payer: Commercial Managed Care - HMO | Source: Ambulatory Visit | Attending: Unknown Physician Specialty | Admitting: Unknown Physician Specialty

## 2016-02-25 ENCOUNTER — Other Ambulatory Visit: Payer: Self-pay | Admitting: Unknown Physician Specialty

## 2016-02-25 DIAGNOSIS — R29890 Loss of height: Secondary | ICD-10-CM

## 2016-02-25 DIAGNOSIS — J449 Chronic obstructive pulmonary disease, unspecified: Secondary | ICD-10-CM | POA: Diagnosis not present

## 2016-02-25 DIAGNOSIS — Z8262 Family history of osteoporosis: Secondary | ICD-10-CM | POA: Diagnosis not present

## 2016-02-25 DIAGNOSIS — Z13820 Encounter for screening for osteoporosis: Secondary | ICD-10-CM | POA: Diagnosis not present

## 2016-02-25 DIAGNOSIS — Z1231 Encounter for screening mammogram for malignant neoplasm of breast: Secondary | ICD-10-CM

## 2016-02-25 DIAGNOSIS — Z780 Asymptomatic menopausal state: Secondary | ICD-10-CM | POA: Diagnosis not present

## 2016-02-25 DIAGNOSIS — Z803 Family history of malignant neoplasm of breast: Secondary | ICD-10-CM | POA: Insufficient documentation

## 2016-03-03 DIAGNOSIS — G4734 Idiopathic sleep related nonobstructive alveolar hypoventilation: Secondary | ICD-10-CM | POA: Diagnosis not present

## 2016-03-03 DIAGNOSIS — J449 Chronic obstructive pulmonary disease, unspecified: Secondary | ICD-10-CM | POA: Diagnosis not present

## 2016-03-03 DIAGNOSIS — R911 Solitary pulmonary nodule: Secondary | ICD-10-CM | POA: Diagnosis not present

## 2016-03-05 ENCOUNTER — Encounter: Payer: Self-pay | Admitting: Family Medicine

## 2016-03-05 ENCOUNTER — Ambulatory Visit (INDEPENDENT_AMBULATORY_CARE_PROVIDER_SITE_OTHER): Payer: Commercial Managed Care - HMO | Admitting: Family Medicine

## 2016-03-05 VITALS — BP 129/80 | HR 88 | Temp 98.2°F | Wt 151.0 lb

## 2016-03-05 DIAGNOSIS — R399 Unspecified symptoms and signs involving the genitourinary system: Secondary | ICD-10-CM | POA: Diagnosis not present

## 2016-03-05 DIAGNOSIS — E538 Deficiency of other specified B group vitamins: Secondary | ICD-10-CM

## 2016-03-05 DIAGNOSIS — N390 Urinary tract infection, site not specified: Secondary | ICD-10-CM | POA: Diagnosis not present

## 2016-03-05 DIAGNOSIS — J449 Chronic obstructive pulmonary disease, unspecified: Secondary | ICD-10-CM | POA: Diagnosis not present

## 2016-03-05 MED ORDER — SULFAMETHOXAZOLE-TRIMETHOPRIM 800-160 MG PO TABS: 1.0000 | ORAL_TABLET | Freq: Two times a day (BID) | ORAL | 0 refills | Status: DC

## 2016-03-05 NOTE — Progress Notes (Signed)
   BP 129/80   Pulse 88   Temp 98.2 F (36.8 C)   Wt 151 lb (68.5 kg)   LMP  (LMP Unknown)   SpO2 99%   BMI 31.56 kg/m    Subjective:    [name], female    DOB: 08/17/1950, 65 y.o.   MRN: [id number]  HPI: Jordan Chan is a 65 y.o. female  Chief Complaint  Patient presents with  . Urinary Tract Infection    started yesterday, painful urination, fequency, pressure, burning   Patient presents with 1 day history of severe dysuria, frequency, pelvic pressure. States her daughter told her to drink dill pickle juice which helped reduce symptoms somewhat but she has been in severe discomfort. Has not had a UTI in many years, but has had them in the past and this feels consistent. Denies fever, chills, back pain, N/V/D. No abnormal discharge or vaginal symptoms.   Also due for B12 shot and would like to get it while she is here.   Relevant past medical, surgical, family and social history reviewed and updated as indicated. Interim medical history since our last visit reviewed. Allergies and medications reviewed and updated.  Review of Systems  Constitutional: Negative.   Respiratory: Negative.   Gastrointestinal: Negative.   Genitourinary: Positive for dysuria and frequency. Negative for flank pain and hematuria.  Musculoskeletal: Negative.   Neurological: Negative.   Psychiatric/Behavioral: Negative.     Per HPI unless specifically indicated above     Objective:    BP 129/80   Pulse 88   Temp 98.2 F (36.8 C)   Wt 151 lb (68.5 kg)   LMP  (LMP Unknown)   SpO2 99%   BMI 31.56 kg/m   Wt Readings from Last 3 Encounters:  03/05/16 151 lb (68.5 kg)  01/21/16 151 lb 6.4 oz (68.7 kg)  10/01/15 154 lb (69.9 kg)    Physical Exam  Constitutional: She is oriented to person, place, and time. She appears well-developed and well-nourished.  HENT:  Head: Atraumatic.  Eyes: Conjunctivae are normal. No scleral icterus.  Neck: Normal range of motion.  Neck supple.  Cardiovascular: Normal rate and normal heart sounds.   Pulmonary/Chest: Effort normal.  Abdominal: Soft. Bowel sounds are normal. There is tenderness (mild TTP in suprapubic area).  Musculoskeletal: Normal range of motion.  Neurological: She is alert and oriented to person, place, and time.  Skin: Skin is warm and dry.  Psychiatric: She has a normal mood and affect. Her behavior is normal.  Nursing note and vitals reviewed.   Results for orders placed or performed in visit on 02/22/16  HM DIABETES EYE EXAM  Result Value Ref Range   HM Diabetic Eye Exam No Retinopathy No Retinopathy      Assessment & Plan:   Problem List Items Addressed This Visit      Digestive   B12 deficiency    Other Visit Diagnoses    UTI (lower urinary tract infection)    -  Primary   Bactrim sent. Discussed staying well hydrated, can try cranberry juice or capsules for prevention if she wants.    Relevant Medications   sulfamethoxazole-trimethoprim (BACTRIM DS,SEPTRA DS) 800-160 MG tablet   Other Relevant Orders   UA/M w/rflx Culture, Routine (STAT)    B12 given today.    Follow up plan: Return if symptoms worsen or fail to improve.

## 2016-03-05 NOTE — Patient Instructions (Signed)
Follow up as needed

## 2016-03-09 LAB — UA/M W/RFLX CULTURE, ROUTINE
Bilirubin, UA: NEGATIVE
Glucose, UA: NEGATIVE
Ketones, UA: NEGATIVE
Nitrite, UA: NEGATIVE
Specific Gravity, UA: 1.02 (ref 1.005–1.030)
Urobilinogen, Ur: 0.2 mg/dL (ref 0.2–1.0)
pH, UA: 6.5 (ref 5.0–7.5)

## 2016-03-09 LAB — URINE CULTURE, REFLEX

## 2016-03-09 LAB — MICROSCOPIC EXAMINATION

## 2016-03-11 ENCOUNTER — Other Ambulatory Visit: Payer: Self-pay | Admitting: Unknown Physician Specialty

## 2016-03-14 ENCOUNTER — Telehealth: Payer: Self-pay

## 2016-03-14 DIAGNOSIS — G8929 Other chronic pain: Secondary | ICD-10-CM

## 2016-03-14 NOTE — Telephone Encounter (Signed)
Referral generated today. 

## 2016-03-14 NOTE — Telephone Encounter (Signed)
Pt called stated she needs an RX for Lisinopril sent to [facility]. Thanks.

## 2016-03-14 NOTE — Telephone Encounter (Signed)
Rx already sent to pharmacy this morning, patient notified. Routing back to provider about referral.

## 2016-03-14 NOTE — Telephone Encounter (Signed)
Patient called in and stated that her referral for pain management has expired and needs a new one put in.

## 2016-03-31 DIAGNOSIS — G894 Chronic pain syndrome: Secondary | ICD-10-CM | POA: Diagnosis not present

## 2016-03-31 DIAGNOSIS — Z9689 Presence of other specified functional implants: Secondary | ICD-10-CM | POA: Diagnosis not present

## 2016-04-01 DIAGNOSIS — R350 Frequency of micturition: Secondary | ICD-10-CM | POA: Diagnosis not present

## 2016-04-02 ENCOUNTER — Telehealth: Payer: Self-pay | Admitting: Unknown Physician Specialty

## 2016-04-02 ENCOUNTER — Other Ambulatory Visit: Payer: Self-pay | Admitting: Unknown Physician Specialty

## 2016-04-02 DIAGNOSIS — Z139 Encounter for screening, unspecified: Secondary | ICD-10-CM

## 2016-04-02 NOTE — Telephone Encounter (Signed)
Pt needs referral to go to [facility] to see Dr Sheran Spine 05/02/16.

## 2016-04-02 NOTE — Telephone Encounter (Signed)
Routing to provider  

## 2016-04-03 NOTE — Telephone Encounter (Signed)
Referral placed.

## 2016-04-05 DIAGNOSIS — J449 Chronic obstructive pulmonary disease, unspecified: Secondary | ICD-10-CM | POA: Diagnosis not present

## 2016-04-07 ENCOUNTER — Other Ambulatory Visit: Payer: Self-pay | Admitting: Family Medicine

## 2016-04-08 ENCOUNTER — Other Ambulatory Visit: Payer: Self-pay | Admitting: Unknown Physician Specialty

## 2016-04-09 ENCOUNTER — Other Ambulatory Visit: Payer: Self-pay | Admitting: Unknown Physician Specialty

## 2016-04-14 ENCOUNTER — Other Ambulatory Visit: Payer: Self-pay

## 2016-04-14 MED ORDER — SITAGLIPTIN PHOSPHATE 100 MG PO TABS: 100.0000 mg | ORAL_TABLET | Freq: Every day | ORAL | 0 refills | Status: DC

## 2016-04-21 ENCOUNTER — Ambulatory Visit (INDEPENDENT_AMBULATORY_CARE_PROVIDER_SITE_OTHER): Payer: Commercial Managed Care - HMO

## 2016-04-21 DIAGNOSIS — E538 Deficiency of other specified B group vitamins: Secondary | ICD-10-CM | POA: Diagnosis not present

## 2016-04-28 ENCOUNTER — Ambulatory Visit
Admission: RE | Admit: 2016-04-28 | Discharge: 2016-04-28 | Disposition: A | Discharge status: Home / Self Care | Payer: Commercial Managed Care - HMO | Source: Ambulatory Visit | Attending: Unknown Physician Specialty | Admitting: Unknown Physician Specialty

## 2016-04-28 ENCOUNTER — Other Ambulatory Visit: Payer: Self-pay

## 2016-04-28 ENCOUNTER — Ambulatory Visit (INDEPENDENT_AMBULATORY_CARE_PROVIDER_SITE_OTHER): Payer: Commercial Managed Care - HMO | Admitting: Unknown Physician Specialty

## 2016-04-28 ENCOUNTER — Encounter: Payer: Self-pay | Admitting: Unknown Physician Specialty

## 2016-04-28 VITALS — BP 126/75 | HR 97 | Temp 97.6°F | Ht 58.5 in | Wt 151.4 lb

## 2016-04-28 DIAGNOSIS — E0822 Diabetes mellitus due to underlying condition with diabetic chronic kidney disease: Secondary | ICD-10-CM

## 2016-04-28 DIAGNOSIS — M436 Torticollis: Secondary | ICD-10-CM | POA: Insufficient documentation

## 2016-04-28 DIAGNOSIS — N183 Chronic kidney disease, stage 3 (moderate): Secondary | ICD-10-CM | POA: Diagnosis not present

## 2016-04-28 DIAGNOSIS — Z23 Encounter for immunization: Secondary | ICD-10-CM

## 2016-04-28 DIAGNOSIS — Z794 Long term (current) use of insulin: Secondary | ICD-10-CM

## 2016-04-28 DIAGNOSIS — R937 Abnormal findings on diagnostic imaging of other parts of musculoskeletal system: Secondary | ICD-10-CM | POA: Diagnosis not present

## 2016-04-28 DIAGNOSIS — M542 Cervicalgia: Secondary | ICD-10-CM | POA: Diagnosis not present

## 2016-04-28 DIAGNOSIS — I129 Hypertensive chronic kidney disease with stage 1 through stage 4 chronic kidney disease, or unspecified chronic kidney disease: Secondary | ICD-10-CM

## 2016-04-28 DIAGNOSIS — M1288 Other specific arthropathies, not elsewhere classified, other specified site: Secondary | ICD-10-CM | POA: Diagnosis not present

## 2016-04-28 DIAGNOSIS — M419 Scoliosis, unspecified: Secondary | ICD-10-CM | POA: Diagnosis not present

## 2016-04-28 DIAGNOSIS — E785 Hyperlipidemia, unspecified: Secondary | ICD-10-CM

## 2016-04-28 LAB — BAYER DCA HB A1C WAIVED: HB A1C (BAYER DCA - WAIVED): 7.7 % — ABNORMAL HIGH (ref <7.0)

## 2016-04-28 LAB — HEMOGLOBIN A1C: Hemoglobin A1C: 7.7

## 2016-04-28 NOTE — Assessment & Plan Note (Signed)
Hgb A1C is 7.7.  Will increase evening insulin 2 units daily until AM sugars are below 120

## 2016-04-28 NOTE — Progress Notes (Signed)
BP 126/75 (BP Location: Left Arm, Patient Position: Sitting, Cuff Size: Normal)   Pulse 97   Temp 97.6 F (36.4 C)   Ht 4' 10.5" (1.486 m)   Wt 151 lb 6.4 oz (68.7 kg)   LMP  (LMP Unknown)   SpO2 96%   BMI 31.10 kg/m    Subjective:    [name], female    DOB: Nov 26, 1950, 65 y.o.   MRN: [id number]  HPI: Jordan Chan is a 65 y.o. female  Chief Complaint  Patient presents with  . Diabetes    pt states she has eye exam scheduled this week   . Hyperlipidemia  . Hypertension  . Neck Pain    pt states her neck has been hurting her a lot here recently    Diabetes: Taking 44 units of insulin in the PM.   Using medications without difficulties No hypoglycemic episodes No hyperglycemic episodes Feet problems: none Blood Sugars averaging: 115-295 eye exam pending Last Hgb A1C: 7.4  Hypertension  Using medications without difficulty Average home BPs not checking   Using medication without problems or lightheadedness No chest pain with exertion or shortness of breath No Edema  Elevated Cholesterol Using medications without problems No Muscle aches  Diet: No changes Exercise: No changes  Neck pain Pt is complaining of neck pain.  She is unable to straighten neck with pain on the right side.    Relevant past medical, surgical, family and social history reviewed and updated as indicated. Interim medical history since our last visit reviewed. Allergies and medications reviewed and updated.  Review of Systems  Per HPI unless specifically indicated above     Objective:    BP 126/75 (BP Location: Left Arm, Patient Position: Sitting, Cuff Size: Normal)   Pulse 97   Temp 97.6 F (36.4 C)   Ht 4' 10.5" (1.486 m)   Wt 151 lb 6.4 oz (68.7 kg)   LMP  (LMP Unknown)   SpO2 96%   BMI 31.10 kg/m   Wt Readings from Last 3 Encounters:  04/28/16 151 lb 6.4 oz (68.7 kg)  03/05/16 151 lb (68.5 kg)  01/21/16 151 lb 6.4 oz (68.7 kg)    Physical  Exam  Constitutional: She is oriented to person, place, and time. She appears well-developed and well-nourished. No distress.  HENT:  Head: Normocephalic and atraumatic.  Eyes: Conjunctivae and lids are normal. Right eye exhibits no discharge. Left eye exhibits no discharge. No scleral icterus.  Neck: Normal range of motion. Neck supple. No JVD present. Carotid bruit is not present.  Cardiovascular: Normal rate, regular rhythm and normal heart sounds.   Pulmonary/Chest: Effort normal and breath sounds normal.  Abdominal: Normal appearance. There is no splenomegaly or hepatomegaly.  Musculoskeletal: Normal range of motion.  Neurological: She is alert and oriented to person, place, and time.  Skin: Skin is warm, dry and intact. No rash noted. No pallor.  Psychiatric: She has a normal mood and affect. Her behavior is normal. Judgment and thought content normal.      Assessment & Plan:   Problem List Items Addressed This Visit      Unprioritized   Benign hypertensive renal disease   Relevant Orders   Comprehensive metabolic panel   Diabetes mellitus with chronic kidney disease ([place])    Hgb A1C is 7.7.  Will increase evening insulin 2 units daily until AM sugars are below 120      Relevant Orders   [facility]  Hb A1c Waived   Comprehensive metabolic panel   Lipid Panel w/o Chol/HDL Ratio   Hyperlipidemia   Relevant Orders   Lipid Panel w/o Chol/HDL Ratio   Torticollis    Get x-ray.  Will refer to Dr. Wynetta Emery for osteopathic manipulation      Relevant Orders   DG Cervical Spine Complete    Other Visit Diagnoses    Need for influenza vaccination    -  Primary   Relevant Orders   Flu vaccine HIGH DOSE PF (Completed)       Follow up plan: Return in about 3 months (around 07/28/2016) for Plus appt with Dr Wynetta Emery for osteopathic manipulation.

## 2016-04-28 NOTE — Assessment & Plan Note (Signed)
Get x-ray.  Will refer to Dr. Wynetta Emery for osteopathic manipulation

## 2016-04-28 NOTE — Patient Instructions (Addendum)
Influenza (Flu) Vaccine (Inactivated or Recombinant):  1. Why get vaccinated? Influenza ("flu") is a contagious disease that spreads around the [place] every year, usually between October and May. Flu is caused by influenza viruses, and is spread mainly by coughing, sneezing, and close contact. Anyone can get flu. Flu strikes suddenly and can last several days. Symptoms vary by age, but can include:  fever/chills  sore throat  muscle aches  fatigue  cough  headache  runny or stuffy nose Flu can also lead to pneumonia and blood infections, and cause diarrhea and seizures in children. If you have a medical condition, such as heart or lung disease, flu can make it worse. Flu is more dangerous for some people. Infants and young children, people 65 years of age and older, pregnant women, and people with certain health conditions or a weakened immune system are at greatest risk. Each year thousands of people in the [place] die from flu, and many more are hospitalized. Flu vaccine can:  keep you from getting flu,  make flu less severe if you do get it, and  keep you from spreading flu to your family and other people. 2. Inactivated and recombinant flu vaccines A dose of flu vaccine is recommended every flu season. Children 6 months through 8 years of age may need two doses during the same flu season. Everyone else needs only one dose each flu season. Some inactivated flu vaccines contain a very small amount of a mercury-based preservative called thimerosal. Studies have not shown thimerosal in vaccines to be harmful, but flu vaccines that do not contain thimerosal are available. There is no live flu virus in flu shots. They cannot cause the flu. There are many flu viruses, and they are always changing. Each year a new flu vaccine is made to protect against three or four viruses that are likely to cause disease in the upcoming flu season. But even when the vaccine doesn't exactly  match these viruses, it may still provide some protection. Flu vaccine cannot prevent:  flu that is caused by a virus not covered by the vaccine, or  illnesses that look like flu but are not. It takes about 2 weeks for protection to develop after vaccination, and protection lasts through the flu season. 3. Some people should not get this vaccine Tell the person who is giving you the vaccine:  If you have any severe, life-threatening allergies. If you ever had a life-threatening allergic reaction after a dose of flu vaccine, or have a severe allergy to any part of this vaccine, you may be advised not to get vaccinated. Most, but not all, types of flu vaccine contain a small amount of egg protein.  If you ever had Guillain-Barre Syndrome (also called GBS). Some people with a history of GBS should not get this vaccine. This should be discussed with your doctor.  If you are not feeling well. It is usually okay to get flu vaccine when you have a mild illness, but you might be asked to come back when you feel better. 4. Risks of a vaccine reaction With any medicine, including vaccines, there is a chance of reactions. These are usually mild and go away on their own, but serious reactions are also possible. Most people who get a flu shot do not have any problems with it. Minor problems following a flu shot include:  soreness, redness, or swelling where the shot was given  hoarseness  sore, red or itchy eyes  cough    fever  aches  headache  itching  fatigue If these problems occur, they usually begin soon after the shot and last 1 or 2 days. More serious problems following a flu shot can include the following:  There may be a small increased risk of Guillain-Barre Syndrome (GBS) after inactivated flu vaccine. This risk has been estimated at 1 or 2 additional cases per million people vaccinated. This is much lower than the risk of severe complications from flu, which can be prevented by  flu vaccine.  Young children who get the flu shot along with pneumococcal vaccine (PCV13) and/or DTaP vaccine at the same time might be slightly more likely to have a seizure caused by fever. Ask your doctor for more information. Tell your doctor if a child who is getting flu vaccine has ever had a seizure. Problems that could happen after any injected vaccine:  People sometimes faint after a medical procedure, including vaccination. Sitting or lying down for about 15 minutes can help prevent fainting, and injuries caused by a fall. Tell your doctor if you feel dizzy, or have vision changes or ringing in the ears.  Some people get severe pain in the shoulder and have difficulty moving the arm where a shot was given. This happens very rarely.  Any medication can cause a severe allergic reaction. Such reactions from a vaccine are very rare, estimated at about 1 in a million doses, and would happen within a few minutes to a few hours after the vaccination. As with any medicine, there is a very remote chance of a vaccine causing a serious injury or death. The safety of vaccines is always being monitored. For more information, visit: www.cdc.gov/vaccinesafety/ 5. What if there is a serious reaction? What should I look for?  Look for anything that concerns you, such as signs of a severe allergic reaction, very high fever, or unusual behavior. Signs of a severe allergic reaction can include hives, swelling of the face and throat, difficulty breathing, a fast heartbeat, dizziness, and weakness. These would start a few minutes to a few hours after the vaccination. What should I do?  If you think it is a severe allergic reaction or other emergency that can't wait, call 9-1-1 and get the person to the nearest hospital. Otherwise, call your doctor.  Reactions should be reported to the Vaccine Adverse Event Reporting System (VAERS). Your doctor should file this report, or you can do it yourself through the  VAERS web site at www.vaers.hhs.gov, or by calling 1-800-822-7967. VAERS does not give medical advice. 6. The National Vaccine Injury Compensation Program The National Vaccine Injury Compensation Program (VICP) is a federal program that was created to compensate people who may have been injured by certain vaccines. Persons who believe they may have been injured by a vaccine can learn about the program and about filing a claim by calling 1-800-338-2382 or visiting the VICP website at www.hrsa.gov/vaccinecompensation. There is a time limit to file a claim for compensation. 7. How can I learn more?  Ask your healthcare provider. He or she can give you the vaccine package insert or suggest other sources of information.  Call your local or state health department.  Contact the Centers for Disease Control and Prevention (CDC):  Call 1-800-232-4636 (1-800-CDC-INFO) or  Visit CDC's website at www.cdc.gov/flu Vaccine Information Statement Inactivated Influenza Vaccine (03/10/2014)   This information is not intended to replace advice given to you by your health care provider. Make sure you discuss any questions you have with   your health care provider.   ------------------------------------------------------------------------------- Base your long acting insulin on your fasting (usually in the morning) blood sugar.  Increase long acting (daily insulin) 2 units if fasting blood sugar is greater than 120.  Decrease by 2 units if fasting blood sugar is less than 95.

## 2016-04-29 ENCOUNTER — Ambulatory Visit (INDEPENDENT_AMBULATORY_CARE_PROVIDER_SITE_OTHER): Payer: Commercial Managed Care - HMO | Admitting: Family Medicine

## 2016-04-29 ENCOUNTER — Encounter: Payer: Self-pay | Admitting: Family Medicine

## 2016-04-29 DIAGNOSIS — M436 Torticollis: Secondary | ICD-10-CM | POA: Diagnosis not present

## 2016-04-29 LAB — COMPREHENSIVE METABOLIC PANEL WITH GFR
ALT: 9 IU/L (ref 0–32)
Albumin/Globulin Ratio: 1.1 — ABNORMAL LOW (ref 1.2–2.2)
Albumin: 4 g/dL (ref 3.6–4.8)
BUN/Creatinine Ratio: 21 (ref 12–28)
Calcium: 9.6 mg/dL (ref 8.7–10.3)
GFR calc Af Amer: 105 mL/min/{1.73_m2} (ref >59)
GFR calc non Af Amer: 91 mL/min/{1.73_m2} (ref >59)
Globulin, Total: 3.7 g/dL (ref 1.5–4.5)
Total Protein: 7.7 g/dL (ref 6.0–8.5)

## 2016-04-29 LAB — COMPREHENSIVE METABOLIC PANEL
AST: 15 IU/L (ref 0–40)
Alkaline Phosphatase: 68 IU/L (ref 39–117)
BUN: 14 mg/dL (ref 8–27)
Bilirubin Total: 0.3 mg/dL (ref 0.0–1.2)
CO2: 28 mmol/L (ref 18–29)
Chloride: 97 mmol/L (ref 96–106)
Creatinine, Ser: 0.68 mg/dL (ref 0.57–1.00)
Glucose: 136 mg/dL — ABNORMAL HIGH (ref 65–99)
Potassium: 5.6 mmol/L — ABNORMAL HIGH (ref 3.5–5.2)
Sodium: 139 mmol/L (ref 134–144)

## 2016-04-29 LAB — LIPID PANEL W/O CHOL/HDL RATIO
Cholesterol, Total: 113 mg/dL (ref 100–199)
HDL: 55 mg/dL (ref >39)
LDL Calculated: 43 mg/dL (ref 0–99)
Triglycerides: 77 mg/dL (ref 0–149)
VLDL Cholesterol Cal: 15 mg/dL (ref 5–40)

## 2016-04-29 MED ORDER — CYCLOBENZAPRINE HCL 10 MG PO TABS: 10.0000 mg | ORAL_TABLET | Freq: Every day | ORAL | 1 refills | Status: DC

## 2016-04-29 NOTE — Assessment & Plan Note (Signed)
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 Given that her pain is on the R as well as her inability to move to the R, concern that this may be more structural rather than myofascial. Significant spasm today- will start flexeril at night. Discussed warnings on flexeril including the risk of falls. Will start very gentle lateral bending stretches on her neck. Will get her back ASAP for full OMM evaluation. However, very low threshold to send her to orthopedics for structural evaluation.

## 2016-04-29 NOTE — Progress Notes (Signed)
BP 119/72 (BP Location: Left Arm, Patient Position: Sitting, Cuff Size: Normal)   Pulse 99   Temp 97.3 F (36.3 C)   Wt 152 lb (68.9 kg)   LMP  (LMP Unknown)   SpO2 94%   BMI 31.23 kg/m    Subjective:    [name], female    DOB: 12-12-1950, 66 y.o.   MRN: [id number]  HPI: Jordan Chan is a 65 y.o. female  Chief Complaint  Patient presents with  . Neck Pain   NECK PAIN- has not always had the scoliosis in her neck, that started about 6 months ago but never hurt until about 2 months ago Duration: 6 months- started off not being able to move it well, but not painful. Got painful about 2 months ago Status: worse Treatments attempted: rest  Compliant with recommended treatment: yes Relief with NSAIDs?:  No NSAIDs Taken Location:Right Duration:months Severity: 6/10 Quality: aching Frequency: intermittent Radiation: none Aggravating factors: stress Alleviating factors: nothing Weakness:  no Paresthesias / decreased sensation:  no  Fevers:  no  Relevant past medical, surgical, family and social history reviewed and updated as indicated. Interim medical history since our last visit reviewed. Allergies and medications reviewed and updated.  Review of Systems  Constitutional: Negative.   Respiratory: Negative.   Cardiovascular: Negative.   Musculoskeletal: Positive for myalgias, neck pain and neck stiffness. Negative for arthralgias, back pain, gait problem and joint swelling.  Neurological: Negative.   Psychiatric/Behavioral: Negative.     Per HPI unless specifically indicated above     Objective:    BP 119/72 (BP Location: Left Arm, Patient Position: Sitting, Cuff Size: Normal)   Pulse 99   Temp 97.3 F (36.3 C)   Wt 152 lb (68.9 kg)   LMP  (LMP Unknown)   SpO2 94%   BMI 31.23 kg/m   Wt Readings from Last 3 Encounters:  04/29/16 152 lb (68.9 kg)  04/28/16 151 lb 6.4 oz (68.7 kg)  03/05/16 151 lb (68.5 kg)    Physical Exam    Constitutional: She is oriented to person, place, and time. She appears well-developed and well-nourished. No distress.  HENT:  Head: Normocephalic and atraumatic.  Right Ear: Hearing normal.  Left Ear: Hearing normal.  Nose: Nose normal.  Eyes: Conjunctivae and lids are normal. Right eye exhibits no discharge. Left eye exhibits no discharge. No scleral icterus.  Pulmonary/Chest: Effort normal. No respiratory distress.  Neurological: She is alert and oriented to person, place, and time.  Skin: Skin is warm, dry and intact. No rash noted. She is not diaphoretic. No erythema. No pallor.  Psychiatric: She has a normal mood and affect. Her speech is normal and behavior is normal. Judgment and thought content normal. Cognition and memory are normal.  Nursing note and vitals reviewed. Neck Exam:    Tenderness to Palpation: yes- significant spasm to the trap and SCM on the R    Midline cervical spine: no    Paraspinal neck musculature: yes- R    Trapezius: yes- R    Sternocleidomastoid: yes- R     Range of Motion:     Flexion: Decreased    Extension: Decreased    Lateral rotation: Decreased    Lateral bending: Decreased- significantly to the R     Neuro Examination: Upper extremity DTRs normal & symmetric.  Strength and sensation intact.    Special Tests:     Spurling test: negative     Addson's test: negative  Results for orders placed or performed in visit on 04/28/16  Hemoglobin A1c  Result Value Ref Range   Hemoglobin A1C 7.7%       Assessment & Plan:   Problem List Items Addressed This Visit      Musculoskeletal and Integument   Torticollis    Significant spasm on the R, x-ray shows congenital dextro convex scoliosis and severe arthritis. Given that her pain is on the R as well as her inability to move to the R, concern that this may be more structural rather than myofascial. Significant spasm today- will start flexeril at night. Discussed warnings on flexeril including  the risk of falls. Will start very gentle lateral bending stretches on her neck. Will get her back ASAP for full OMM evaluation. However, very low threshold to send her to orthopedics for structural evaluation.        Other Visit Diagnoses   None.      Follow up plan: Return ASAP, for OMM.

## 2016-05-02 DIAGNOSIS — H2513 Age-related nuclear cataract, bilateral: Secondary | ICD-10-CM | POA: Diagnosis not present

## 2016-05-02 LAB — HM DIABETES EYE EXAM

## 2016-05-05 ENCOUNTER — Encounter: Payer: Self-pay | Admitting: Family Medicine

## 2016-05-05 ENCOUNTER — Ambulatory Visit (INDEPENDENT_AMBULATORY_CARE_PROVIDER_SITE_OTHER): Payer: Commercial Managed Care - HMO | Admitting: Family Medicine

## 2016-05-05 VITALS — BP 112/67 | HR 100 | Temp 97.8°F | Wt 153.1 lb

## 2016-05-05 DIAGNOSIS — M436 Torticollis: Secondary | ICD-10-CM | POA: Diagnosis not present

## 2016-05-05 DIAGNOSIS — M9902 Segmental and somatic dysfunction of thoracic region: Secondary | ICD-10-CM

## 2016-05-05 DIAGNOSIS — M9904 Segmental and somatic dysfunction of sacral region: Secondary | ICD-10-CM | POA: Diagnosis not present

## 2016-05-05 DIAGNOSIS — M9908 Segmental and somatic dysfunction of rib cage: Secondary | ICD-10-CM | POA: Diagnosis not present

## 2016-05-05 DIAGNOSIS — M9905 Segmental and somatic dysfunction of pelvic region: Secondary | ICD-10-CM

## 2016-05-05 DIAGNOSIS — M9900 Segmental and somatic dysfunction of head region: Secondary | ICD-10-CM

## 2016-05-05 DIAGNOSIS — M9907 Segmental and somatic dysfunction of upper extremity: Secondary | ICD-10-CM | POA: Diagnosis not present

## 2016-05-05 DIAGNOSIS — J449 Chronic obstructive pulmonary disease, unspecified: Secondary | ICD-10-CM | POA: Diagnosis not present

## 2016-05-05 DIAGNOSIS — M9909 Segmental and somatic dysfunction of abdomen and other regions: Secondary | ICD-10-CM

## 2016-05-05 DIAGNOSIS — M9901 Segmental and somatic dysfunction of cervical region: Secondary | ICD-10-CM | POA: Diagnosis not present

## 2016-05-05 NOTE — Assessment & Plan Note (Signed)
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 Given that her pain is on the R as well as her inability to move to the R, concern that this may be more structural rather than myofascial. Significant spasm today- will start flexeril at night. Discussed warnings on flexeril including the risk of falls. Will start very gentle lateral bending stretches on her neck. She does have some somatic dysfunction today that I think is contributing to her symptoms. I think she would benefit from OMT. Treated today with fair results as below.

## 2016-05-05 NOTE — Progress Notes (Signed)
BP 112/67 (BP Location: Left Arm, Patient Position: Sitting, Cuff Size: Normal)   Pulse 100   Temp 97.8 F (36.6 C)   Wt 153 lb 1.6 oz (69.4 kg)   LMP  (LMP Unknown)   SpO2 95%   BMI 31.45 kg/m    Subjective:    [name], female    DOB: Dec 07, 1950, 65 y.o.   MRN: [id number]  HPI: Jordan Chan is a 65 y.o. female  Chief Complaint  Patient presents with  . OMM   Jordan Chan presents today not feeling significantly better. She notes that she has been doing her stretches and taking her flexeril and she's doing a little better, but not significantly. She notes that her neck feels tight and stuck on the R side. She cannot side bend her head to that direction. She feels like it won't move very well. She notes that it radiates into her shoulder and feels tight there as well. She deals with chronic pain, but states that this hurts a lot. She notes that it is a little better with the medicine. It's worse if she tries to bend her head to the R. She is otherwise doing OK with no other concerns or complaints at this time. She would very much like to try OMT today to see if it helps.   Relevant past medical, surgical, family and social history reviewed and updated as indicated. Interim medical history since our last visit reviewed. Allergies and medications reviewed and updated.  Review of Systems  Constitutional: Negative.   Respiratory: Negative.   Cardiovascular: Negative.   Musculoskeletal: Positive for back pain, myalgias, neck pain and neck stiffness. Negative for arthralgias, gait problem and joint swelling.  Psychiatric/Behavioral: Negative.     Per HPI unless specifically indicated above     Objective:    BP 112/67 (BP Location: Left Arm, Patient Position: Sitting, Cuff Size: Normal)   Pulse 100   Temp 97.8 F (36.6 C)   Wt 153 lb 1.6 oz (69.4 kg)   LMP  (LMP Unknown)   SpO2 95%   BMI 31.45 kg/m   Wt Readings from Last 3 Encounters:  05/05/16 153  lb 1.6 oz (69.4 kg)  04/29/16 152 lb (68.9 kg)  04/28/16 151 lb 6.4 oz (68.7 kg)    Physical Exam  Constitutional: She is oriented to person, place, and time. She appears well-developed and well-nourished. No distress.  HENT:  Head: Normocephalic and atraumatic.  Right Ear: Hearing normal.  Left Ear: Hearing normal.  Nose: Nose normal.  Eyes: Conjunctivae, EOM and lids are normal. Pupils are equal, round, and reactive to light. Right eye exhibits no discharge. Left eye exhibits no discharge. No scleral icterus.  Pulmonary/Chest: Effort normal. No respiratory distress.  Abdominal: Soft. She exhibits no distension and no mass. There is no tenderness. There is no rebound and no guarding.  Neurological: She is alert and oriented to person, place, and time.  Skin: Skin is warm, dry and intact. No rash noted. She is not diaphoretic. No erythema. No pallor.  Psychiatric: She has a normal mood and affect. Her speech is normal and behavior is normal. Judgment and thought content normal. Cognition and memory are normal.  Nursing note and vitals reviewed. Musculoskeletal:  Exam found Decreased ROM, Tissue texture changes, Tenderness to palpation and Asymmetry of patient's  head, neck, thorax, ribs, pelvis, sacrum, upper extremity and abdomen Osteopathic Structural Exam:   Head: OAESSR, hypertonic suboccipital muscles   Neck: SCM hypertonic  bilaterally, Anterior and middle scalene hypertonic on the R, trap spasm on the R, C4ESRR, C5ESRR  Thorax: T1-3SLRR  Ribs: Ribs 1-3 locked up on the R  Pelvis: Anterior L innominate  Sacrum: L on L torsion  Upper Extremity: pec major spasm bilaterally R>L, clavicle severely restricted on the R  Abdomen: diaphragm spasm on the L   Results for orders placed or performed in visit on 04/28/16  Hemoglobin A1c  Result Value Ref Range   Hemoglobin A1C 7.7%       Assessment & Plan:   Problem List Items Addressed This Visit      Musculoskeletal and Integument     Torticollis - Primary    Significant spasm on the R, x-ray shows congenital dextro convex scoliosis and severe arthritis. Given that her pain is on the R as well as her inability to move to the R, concern that this may be more structural rather than myofascial. Significant spasm today- will start flexeril at night. Discussed warnings on flexeril including the risk of falls. Will start very gentle lateral bending stretches on her neck. She does have some somatic dysfunction today that I think is contributing to her symptoms. I think she would benefit from OMT. Treated today with fair results as below.        Other Visit Diagnoses    Cranial somatic dysfunction       Cervical somatic dysfunction       Segmental dysfunction of thoracic region       Somatic dysfunction of rib       Segmental dysfunction of upper extremity       Somatic dysfunction of spine, sacral       Pelvic somatic dysfunction       Segmental dysfunction of abdomen         After verbal consent was obtained, patient was treated today with osteopathic manipulative medicine to the regions of the head, neck, thorax, ribs, pelvis, sacrum, abdomen and upper extremity using the techniques of cranial, myofascial release, counterstrain and soft tissue. Areas of compensation relating to her primary pain source also treated. Patient tolerated the procedure well with fair objective and fair subjective improvement in symptoms.  She left the room in good condition. She was advised to stay well hydrated and that she may have some soreness following the procedure. If not improving or worsening, she will call and come in. Home exercise program of stretches for neck discussed and demonstrated today. Patient will do these stretches BID to before the point of pain, and will return for reevaluation  4-5 days given severity.   Follow up plan: Return Thursday or Friday, for OMM.

## 2016-05-08 ENCOUNTER — Ambulatory Visit: Payer: Commercial Managed Care - HMO | Admitting: Family Medicine

## 2016-05-13 ENCOUNTER — Ambulatory Visit: Payer: Commercial Managed Care - HMO | Admitting: Family Medicine

## 2016-05-16 ENCOUNTER — Ambulatory Visit: Payer: Commercial Managed Care - HMO | Admitting: Family Medicine

## 2016-05-20 ENCOUNTER — Telehealth: Payer: Self-pay | Admitting: Family Medicine

## 2016-05-20 ENCOUNTER — Encounter: Payer: Self-pay | Admitting: Family Medicine

## 2016-05-20 ENCOUNTER — Ambulatory Visit (INDEPENDENT_AMBULATORY_CARE_PROVIDER_SITE_OTHER): Payer: Commercial Managed Care - HMO | Admitting: Family Medicine

## 2016-05-20 VITALS — BP 114/72 | HR 102 | Temp 97.9°F | Wt 152.0 lb

## 2016-05-20 DIAGNOSIS — M436 Torticollis: Secondary | ICD-10-CM | POA: Diagnosis not present

## 2016-05-20 DIAGNOSIS — M9908 Segmental and somatic dysfunction of rib cage: Secondary | ICD-10-CM

## 2016-05-20 DIAGNOSIS — M9904 Segmental and somatic dysfunction of sacral region: Secondary | ICD-10-CM | POA: Diagnosis not present

## 2016-05-20 DIAGNOSIS — M9902 Segmental and somatic dysfunction of thoracic region: Secondary | ICD-10-CM | POA: Diagnosis not present

## 2016-05-20 DIAGNOSIS — M9905 Segmental and somatic dysfunction of pelvic region: Secondary | ICD-10-CM

## 2016-05-20 DIAGNOSIS — M9909 Segmental and somatic dysfunction of abdomen and other regions: Secondary | ICD-10-CM | POA: Diagnosis not present

## 2016-05-20 DIAGNOSIS — M9901 Segmental and somatic dysfunction of cervical region: Secondary | ICD-10-CM | POA: Diagnosis not present

## 2016-05-20 DIAGNOSIS — M419 Scoliosis, unspecified: Secondary | ICD-10-CM | POA: Diagnosis not present

## 2016-05-20 DIAGNOSIS — E538 Deficiency of other specified B group vitamins: Secondary | ICD-10-CM

## 2016-05-20 DIAGNOSIS — M9907 Segmental and somatic dysfunction of upper extremity: Secondary | ICD-10-CM | POA: Diagnosis not present

## 2016-05-20 DIAGNOSIS — M9900 Segmental and somatic dysfunction of head region: Secondary | ICD-10-CM | POA: Diagnosis not present

## 2016-05-20 NOTE — Progress Notes (Signed)
BP 114/72 (BP Location: Left Arm, Patient Position: Sitting, Cuff Size: Normal)   Pulse (!) 102   Temp 97.9 F (36.6 C)   Wt 152 lb (68.9 kg)   LMP  (LMP Unknown)   SpO2 99%   BMI 31.23 kg/m    Subjective:    [name], female    DOB: 10/14/1950, 65 y.o.   MRN: [id number]  HPI: Jordan Chan is a 65 y.o. female  Chief Complaint  Patient presents with  . Neck Pain   States that she felt a bit better after last time, but still in a lot of pain and not feeling well. She notes that she has been doing her stretches faithfully and that her head is a bit more level, but that she is still having trouble moving it to the R at all. She notes that she did well with the flexeril- waking up with less pain. Maybe a little better, but still not feeling like herself. She notes that her pain is in her neck especially on the R in the upper portion of her neck around C4-5. It is tight and aching. She cannot move it at all. Pain will occasionally radiate down her neck into her upper back and into collar bone. It is pretty severe. She notes that medication and OMT and stretching seem to help a little bit. Stress seems to make it worse. She is otherwise feeling well today with no other concerns or complaints at this time.   Relevant past medical, surgical, family and social history reviewed and updated as indicated. Interim medical history since our last visit reviewed. Allergies and medications reviewed and updated.  Review of Systems  Constitutional: Negative.   Respiratory: Negative.   Cardiovascular: Negative.   Musculoskeletal: Positive for arthralgias, myalgias, neck pain and neck stiffness. Negative for back pain, gait problem and joint swelling.  Psychiatric/Behavioral: Negative.     Per HPI unless specifically indicated above     Objective:    BP 114/72 (BP Location: Left Arm, Patient Position: Sitting, Cuff Size: Normal)   Pulse (!) 102   Temp 97.9 F (36.6  C)   Wt 152 lb (68.9 kg)   LMP  (LMP Unknown)   SpO2 99%   BMI 31.23 kg/m   Wt Readings from Last 3 Encounters:  05/20/16 152 lb (68.9 kg)  05/05/16 153 lb 1.6 oz (69.4 kg)  04/29/16 152 lb (68.9 kg)    Physical Exam  Constitutional: She is oriented to person, place, and time. She appears well-developed and well-nourished. No distress.  HENT:  Head: Normocephalic and atraumatic.  Right Ear: Hearing normal.  Left Ear: Hearing normal.  Nose: Nose normal.  Eyes: Conjunctivae and lids are normal. Right eye exhibits no discharge. Left eye exhibits no discharge. No scleral icterus.  Pulmonary/Chest: Effort normal. No respiratory distress.  Abdominal: Soft. She exhibits no distension and no mass. There is no tenderness. There is no rebound and no guarding.  Neurological: She is alert and oriented to person, place, and time.  Skin: Skin is warm, dry and intact. No rash noted. No erythema. No pallor.  Psychiatric: She has a normal mood and affect. Her speech is normal and behavior is normal. Judgment and thought content normal. Cognition and memory are normal.  Nursing note and vitals reviewed. Musculoskeletal:  Exam found Decreased ROM, Tissue texture changes, Tenderness to palpation and Asymmetry of patient's  head, neck, thorax, ribs, pelvis, sacrum, upper extremity and abdomen Osteopathic Structural  Exam:   Head: hypertonic suboccipital muscles, OAESSR, OM suture restricted on the R, temporal internally rotated and anterior on the R  Neck: bilateral SCMs hypertonic, trap spasm on the R, scalenes hypertonic on the R, C3-5ESRL, significant curve to the L with sidebending L  Thorax: T2-4SLRR, trap spasm on the R  Ribs: Ribs 5-9 locked down on the R  Pelvis: Posterior R innominate  Sacrum: L on L sacral torsion  Upper Extremity: Clavicle restricted on the R, pec major spasm on the R  Abdomen: diaphragm spasm on the R   Results for orders placed or performed in visit on 05/06/16  HM  DIABETES EYE EXAM  Result Value Ref Range   HM Diabetic Eye Exam No Retinopathy No Retinopathy      Assessment & Plan:   Problem List Items Addressed This Visit      Musculoskeletal and Integument   Torticollis - Primary    Significant spasm on the R, however significantly improved from last visit with much more pliable muscles. 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 Given that her pain is on the R as well as her inability to move to the R, concern that this may be more structural rather than myofascial. Will refer to orthopedics for the cervical scoliosis. She does have some somatic dysfunction today that I think is contributing to her symptoms. I think she would benefit from OMT. Treated today with fair results as below.       Relevant Orders   Ambulatory referral to Orthopedic Surgery   Cervical scoliosis    Muscle spasm improving, but still significant structural issues. Will refer back to orthopedics, who she has seen before. Referral generated today.      Relevant Orders   Ambulatory referral to Orthopedic Surgery    Other Visit Diagnoses    Cranial somatic dysfunction       Cervical somatic dysfunction       Segmental dysfunction of thoracic region       Somatic dysfunction of rib       Segmental dysfunction of upper extremity       Somatic dysfunction of spine, sacral       Pelvic somatic dysfunction       Segmental dysfunction of abdomen         After verbal consent was obtained, patient was treated today with osteopathic manipulative medicine to the regions of the head, neck, thorax, ribs, pelvis, sacrum, abdomen and upper extremity using the techniques of cranial, FPR, myofascial release, counterstrain and soft tissue. Areas of compensation relating to her primary pain source also treated. Patient tolerated the procedure well with fair objective and fair subjective improvement in symptoms.  She left the room in good condition. She was advised  to stay well hydrated and that she may have some soreness following the procedure. If not improving or worsening, she will call and come in. Home exercise program of stretches for neck discussed and demonstrated today. Patient will do these stretches BID to before the point of pain, and will return for reevaluation  in 1-2 weeks.   Follow up plan: Return in about 1 week (around 05/27/2016) for OMM.

## 2016-05-20 NOTE — Telephone Encounter (Signed)
Patient called to rescheduled her appointment her pain clinic appt got change to the same  Day and she couldn't make it to both. She will be seen on 10/24 for her follow up.

## 2016-05-20 NOTE — Assessment & Plan Note (Signed)
Muscle spasm improving, but still significant structural issues. Will refer back to orthopedics, who she has seen before. Referral generated today.

## 2016-05-20 NOTE — Assessment & Plan Note (Signed)
Significant spasm on the R, however significantly improved from last visit with much more pliable muscles. 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 Given that her pain is on the R as well as her inability to move to the R, concern that this may be more structural rather than myofascial. Will refer to orthopedics for the cervical scoliosis. She does have some somatic dysfunction today that I think is contributing to her symptoms. I think she would benefit from OMT. Treated today with fair results as below.

## 2016-05-27 ENCOUNTER — Ambulatory Visit: Payer: Commercial Managed Care - HMO | Admitting: Family Medicine

## 2016-05-29 DIAGNOSIS — M542 Cervicalgia: Secondary | ICD-10-CM | POA: Diagnosis not present

## 2016-05-30 ENCOUNTER — Ambulatory Visit (INDEPENDENT_AMBULATORY_CARE_PROVIDER_SITE_OTHER): Payer: Commercial Managed Care - HMO | Admitting: Family Medicine

## 2016-05-30 ENCOUNTER — Encounter: Payer: Self-pay | Admitting: Family Medicine

## 2016-05-30 VITALS — BP 133/69 | HR 81 | Temp 97.8°F | Wt 149.3 lb

## 2016-05-30 DIAGNOSIS — M9908 Segmental and somatic dysfunction of rib cage: Secondary | ICD-10-CM

## 2016-05-30 DIAGNOSIS — M9907 Segmental and somatic dysfunction of upper extremity: Secondary | ICD-10-CM | POA: Diagnosis not present

## 2016-05-30 DIAGNOSIS — M436 Torticollis: Secondary | ICD-10-CM | POA: Diagnosis not present

## 2016-05-30 DIAGNOSIS — M419 Scoliosis, unspecified: Secondary | ICD-10-CM | POA: Diagnosis not present

## 2016-05-30 DIAGNOSIS — M9900 Segmental and somatic dysfunction of head region: Secondary | ICD-10-CM

## 2016-05-30 DIAGNOSIS — M9901 Segmental and somatic dysfunction of cervical region: Secondary | ICD-10-CM

## 2016-05-30 DIAGNOSIS — M9909 Segmental and somatic dysfunction of abdomen and other regions: Secondary | ICD-10-CM | POA: Diagnosis not present

## 2016-05-30 NOTE — Progress Notes (Signed)
BP 133/69 (BP Location: Left Arm, Patient Position: Sitting, Cuff Size: Normal)   Pulse 81   Temp 97.8 F (36.6 C)   Wt 149 lb 4.8 oz (67.7 kg)   LMP  (LMP Unknown)   SpO2 98%   BMI 30.67 kg/m    Subjective:    [name], female    DOB: 01/27/1951, 65 y.o.   MRN: [id number]  HPI: Jordan Chan is a 65 y.o. female  Chief Complaint  Patient presents with  . OMM   Sarye presents today for follow up on her neck. She states that she does better after treatment for a couple of days, but then she starts to feel tight again after a day or 2. She has been doing her stretches faithfully. She has been taking her flexeril and it seems to be helping a little bit. She saw the orthopedist yesterday and he is going to get a CT of her neck to look at her foreman and see if she is having nerve root impingement. She notes that she is feeling tired today. Her pain is still pretty severe, but she seeing her chronic pain doctor on Monday. Her pain is aching and tight. She has noticed it radiating into her R collar bone and occasionally into her head. She is worse with stress and laying flat. She is otherwise doing well with no other concerns or complaints at this time.  Relevant past medical, surgical, family and social history reviewed and updated as indicated. Interim medical history since our last visit reviewed. Allergies and medications reviewed and updated.  Review of Systems  Constitutional: Negative.   Respiratory: Negative.   Cardiovascular: Negative.   Musculoskeletal: Positive for arthralgias, back pain, myalgias, neck pain and neck stiffness. Negative for gait problem and joint swelling.  Psychiatric/Behavioral: Negative.     Per HPI unless specifically indicated above     Objective:    BP 133/69 (BP Location: Left Arm, Patient Position: Sitting, Cuff Size: Normal)   Pulse 81   Temp 97.8 F (36.6 C)   Wt 149 lb 4.8 oz (67.7 kg)   LMP  (LMP Unknown)    SpO2 98%   BMI 30.67 kg/m   Wt Readings from Last 3 Encounters:  05/30/16 149 lb 4.8 oz (67.7 kg)  05/20/16 152 lb (68.9 kg)  05/05/16 153 lb 1.6 oz (69.4 kg)    Physical Exam  Constitutional: She is oriented to person, place, and time. She appears well-developed and well-nourished. No distress.  HENT:  Head: Normocephalic and atraumatic.  Right Ear: Hearing normal.  Left Ear: Hearing normal.  Nose: Nose normal.  Eyes: Conjunctivae and lids are normal. Right eye exhibits no discharge. Left eye exhibits no discharge. No scleral icterus.  Pulmonary/Chest: Effort normal. No respiratory distress.  Abdominal: Soft. She exhibits no distension and no mass. There is no tenderness. There is no rebound and no guarding.  Neurological: She is alert and oriented to person, place, and time.  Skin: Skin is warm, dry and intact. No rash noted. She is not diaphoretic. No erythema. No pallor.  Psychiatric: She has a normal mood and affect. Her speech is normal and behavior is normal. Judgment and thought content normal. Cognition and memory are normal.  Nursing note and vitals reviewed. Musculoskeletal:  Exam found Decreased ROM, Tissue texture changes, Tenderness to palpation and Asymmetry of patient's  head, neck, ribs, upper extremity and abdomen Osteopathic Structural Exam:   Head: Hypertonic suboccipital muscles, OAESSL  Neck: C4-5ESRR, SCM hypertonicity bilaterally R>>L, scalenes hypertonic R>L, trap spasm on the R  Ribs: ribs 8-10 locked down on the R  Upper Extremity: clavicle restricted on the R  Abdomen: diaphragm spasm on the R   Results for orders placed or performed in visit on 05/06/16  HM DIABETES EYE EXAM  Result Value Ref Range   HM Diabetic Eye Exam No Retinopathy No Retinopathy      Assessment & Plan:   Problem List Items Addressed This Visit      Musculoskeletal and Integument   Torticollis - Primary    Significant spasm on the R, however significantly improved from last  visit again with much more pliable muscles. 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 Seeing orthopedics for CT scan of her neck. She does have some somatic dysfunction today that I think is contributing to her symptoms. I think she would benefit from OMT. Treated today with fair results as below.       Cervical scoliosis    Following up with orthopedics. To have CT scan of her neck. Await results.        Other Visit Diagnoses    Cranial somatic dysfunction       Cervical somatic dysfunction       Somatic dysfunction of rib       Segmental dysfunction of upper extremity       Segmental dysfunction of abdomen         After verbal consent was obtained, patient was treated today with osteopathic manipulative medicine to the regions of the head, neck, ribs, abdomen and upper extremity using the techniques of myofascial release, counterstrain, muscle energy and soft tissue. Areas of compensation relating to her primary pain source also treated. Patient tolerated the procedure well with good objective and good subjective improvement in symptoms. She left the room in good condition. She was advised to stay well hydrated and that she may have some soreness following the procedure. If not improving or worsening, she will call and come in. She will return for reevaluation   in 2-3 weeks.   Follow up plan: Return in about 2 weeks (around 06/13/2016) for OMM.

## 2016-05-30 NOTE — Assessment & Plan Note (Signed)
Significant spasm on the R, however significantly improved from last visit again with much more pliable muscles. 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 Seeing orthopedics for CT scan of her neck. She does have some somatic dysfunction today that I think is contributing to her symptoms. I think she would benefit from OMT. Treated today with fair results as below.

## 2016-05-30 NOTE — Assessment & Plan Note (Signed)
Following up with orthopedics. To have CT scan of her neck. Await results.

## 2016-06-03 ENCOUNTER — Ambulatory Visit: Payer: Commercial Managed Care - HMO | Admitting: Family Medicine

## 2016-06-03 DIAGNOSIS — G8929 Other chronic pain: Secondary | ICD-10-CM | POA: Diagnosis not present

## 2016-06-03 DIAGNOSIS — M542 Cervicalgia: Secondary | ICD-10-CM | POA: Diagnosis not present

## 2016-06-03 DIAGNOSIS — G894 Chronic pain syndrome: Secondary | ICD-10-CM | POA: Diagnosis not present

## 2016-06-03 DIAGNOSIS — M545 Low back pain: Secondary | ICD-10-CM | POA: Diagnosis not present

## 2016-06-03 DIAGNOSIS — Z79899 Other long term (current) drug therapy: Secondary | ICD-10-CM | POA: Diagnosis not present

## 2016-06-05 DIAGNOSIS — J449 Chronic obstructive pulmonary disease, unspecified: Secondary | ICD-10-CM | POA: Diagnosis not present

## 2016-06-09 ENCOUNTER — Other Ambulatory Visit: Payer: Self-pay | Admitting: Family Medicine

## 2016-06-12 ENCOUNTER — Ambulatory Visit (INDEPENDENT_AMBULATORY_CARE_PROVIDER_SITE_OTHER): Payer: Commercial Managed Care - HMO | Admitting: Family Medicine

## 2016-06-12 ENCOUNTER — Encounter: Payer: Self-pay | Admitting: Family Medicine

## 2016-06-12 VITALS — BP 131/76 | HR 92 | Temp 98.3°F | Wt 150.8 lb

## 2016-06-12 DIAGNOSIS — M9901 Segmental and somatic dysfunction of cervical region: Secondary | ICD-10-CM | POA: Diagnosis not present

## 2016-06-12 DIAGNOSIS — M9900 Segmental and somatic dysfunction of head region: Secondary | ICD-10-CM

## 2016-06-12 DIAGNOSIS — M436 Torticollis: Secondary | ICD-10-CM

## 2016-06-12 DIAGNOSIS — M419 Scoliosis, unspecified: Secondary | ICD-10-CM

## 2016-06-12 NOTE — Progress Notes (Signed)
BP 131/76 (BP Location: Left Arm, Patient Position: Sitting, Cuff Size: Normal)   Pulse 92   Temp 98.3 F (36.8 C)   Wt 150 lb 12.8 oz (68.4 kg)   LMP  (LMP Unknown)   SpO2 99%   BMI 30.98 kg/m    Subjective:    [name], female    DOB: 08/04/1951, 65 y.o.   MRN: [id number]  HPI: Jordan Chan is a 65 y.o. female  Chief Complaint  Patient presents with  . OMM   Jordan Chan is not doing well today. She states that the weather is making her neck hurt more and bend more to the L. She usually just has pain on the R, but she is having a lot on the L right now. Radiates into her head and into her upper chest. Better with heat and OMT. Worse with stress and movement. It's pretty severe today. Tight and spasming. She is otherwise doing OK. She has not heard about her CT yet. Going to call the orthopedist today. Otherwise doing well with no other concerns or complaints at this time.   Relevant past medical, surgical, family and social history reviewed and updated as indicated. Interim medical history since our last visit reviewed. Allergies and medications reviewed and updated.  Review of Systems  Constitutional: Negative.   Respiratory: Negative.   Cardiovascular: Negative.   Musculoskeletal: Positive for myalgias, neck pain and neck stiffness. Negative for arthralgias, back pain, gait problem and joint swelling.  Psychiatric/Behavioral: Negative.     Per HPI unless specifically indicated above     Objective:    BP 131/76 (BP Location: Left Arm, Patient Position: Sitting, Cuff Size: Normal)   Pulse 92   Temp 98.3 F (36.8 C)   Wt 150 lb 12.8 oz (68.4 kg)   LMP  (LMP Unknown)   SpO2 99%   BMI 30.98 kg/m   Wt Readings from Last 3 Encounters:  06/12/16 150 lb 12.8 oz (68.4 kg)  05/30/16 149 lb 4.8 oz (67.7 kg)  05/20/16 152 lb (68.9 kg)    Physical Exam  Constitutional: She is oriented to person, place, and time. She appears well-developed and  well-nourished. No distress.  HENT:  Head: Normocephalic and atraumatic.  Right Ear: Hearing normal.  Left Ear: Hearing normal.  Nose: Nose normal.  Eyes: Conjunctivae and lids are normal. Right eye exhibits no discharge. Left eye exhibits no discharge. No scleral icterus.  Pulmonary/Chest: Effort normal. No respiratory distress.  Musculoskeletal: Normal range of motion.  Neurological: She is alert and oriented to person, place, and time.  Skin: Skin is warm, dry and intact. No rash noted. No erythema. No pallor.  Psychiatric: She has a normal mood and affect. Her speech is normal and behavior is normal. Judgment and thought content normal. Cognition and memory are normal.  Nursing note and vitals reviewed. Musculoskeletal:  Exam found Decreased ROM, Tissue texture changes, Tenderness to palpation and Asymmetry of patient's  head and neck Osteopathic Structural Exam:   Head: Hypertonic suboccipital muscles  Neck: SCM hypertonic bilaterally L>R, scalenes hypertonic on the L, trap spasm on the R, C4-5 ESRL  Results for orders placed or performed in visit on 05/06/16  HM DIABETES EYE EXAM  Result Value Ref Range   HM Diabetic Eye Exam No Retinopathy No Retinopathy      Assessment & Plan:   Problem List Items Addressed This Visit      Musculoskeletal and Integument   Torticollis - Primary  Significant spasm on the R, in exacerbation today because of the weather. 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 Seeing orthopedics for CT scan of her neck. She does have some somatic dysfunction today that I think is contributing to her symptoms. I think she would benefit from OMT. Treated today with fair results as below.       Cervical scoliosis    Following up with orthopedics. To have CT scan of her neck. Await results.        Other Visit Diagnoses    Cranial somatic dysfunction       Cervical somatic dysfunction         After verbal consent was obtained,  patient was treated today with osteopathic manipulative medicine to the regions of the head and neck using the techniques of cranial, myofascial release, counterstrain and soft tissue. Areas of compensation relating to her primary pain source also treated. Patient tolerated the procedure well with good objective and good subjective improvement in symptoms. She left the room in good condition. She was advised to stay well hydrated and that she may have some soreness following the procedure. If not improving or worsening, she will call and come in. She will return for reevaluation   in 2-3 weeks.   Follow up plan: Return in about 2 weeks (around 06/26/2016) for OMM.

## 2016-06-12 NOTE — Assessment & Plan Note (Addendum)
Significant spasm on the R, in exacerbation today because of the weather. 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 Seeing orthopedics for CT scan of her neck. She does have some somatic dysfunction today that I think is contributing to her symptoms. I think she would benefit from OMT. Treated today with fair results as below.

## 2016-06-12 NOTE — Assessment & Plan Note (Signed)
Following up with orthopedics. To have CT scan of her neck. Await results.

## 2016-06-13 ENCOUNTER — Encounter: Payer: Self-pay | Admitting: Unknown Physician Specialty

## 2016-06-16 ENCOUNTER — Other Ambulatory Visit: Payer: Self-pay | Admitting: Family Medicine

## 2016-06-20 ENCOUNTER — Other Ambulatory Visit: Payer: Self-pay | Admitting: Orthopedic Surgery

## 2016-06-20 DIAGNOSIS — M542 Cervicalgia: Secondary | ICD-10-CM

## 2016-06-24 ENCOUNTER — Ambulatory Visit: Payer: Commercial Managed Care - HMO | Admitting: Unknown Physician Specialty

## 2016-06-27 ENCOUNTER — Ambulatory Visit
Admission: RE | Admit: 2016-06-27 | Discharge: 2016-06-27 | Disposition: A | Discharge status: Home / Self Care | Payer: Commercial Managed Care - HMO | Source: Ambulatory Visit | Attending: Orthopedic Surgery | Admitting: Orthopedic Surgery

## 2016-06-27 ENCOUNTER — Ambulatory Visit: Payer: Commercial Managed Care - HMO | Admitting: Family Medicine

## 2016-06-27 DIAGNOSIS — M4802 Spinal stenosis, cervical region: Secondary | ICD-10-CM | POA: Diagnosis not present

## 2016-06-27 DIAGNOSIS — M50322 Other cervical disc degeneration at C5-C6 level: Secondary | ICD-10-CM | POA: Diagnosis not present

## 2016-06-27 DIAGNOSIS — R918 Other nonspecific abnormal finding of lung field: Secondary | ICD-10-CM | POA: Insufficient documentation

## 2016-06-27 DIAGNOSIS — M4182 Other forms of scoliosis, cervical region: Secondary | ICD-10-CM | POA: Insufficient documentation

## 2016-06-27 DIAGNOSIS — M542 Cervicalgia: Secondary | ICD-10-CM

## 2016-06-27 DIAGNOSIS — M1288 Other specific arthropathies, not elsewhere classified, other specified site: Secondary | ICD-10-CM | POA: Diagnosis not present

## 2016-07-05 DIAGNOSIS — J449 Chronic obstructive pulmonary disease, unspecified: Secondary | ICD-10-CM | POA: Diagnosis not present

## 2016-07-14 ENCOUNTER — Ambulatory Visit (INDEPENDENT_AMBULATORY_CARE_PROVIDER_SITE_OTHER): Payer: Commercial Managed Care - HMO | Admitting: Family Medicine

## 2016-07-14 ENCOUNTER — Encounter: Payer: Self-pay | Admitting: Family Medicine

## 2016-07-14 VITALS — BP 143/85 | HR 98 | Wt 151.7 lb

## 2016-07-14 DIAGNOSIS — M9902 Segmental and somatic dysfunction of thoracic region: Secondary | ICD-10-CM

## 2016-07-14 DIAGNOSIS — I129 Hypertensive chronic kidney disease with stage 1 through stage 4 chronic kidney disease, or unspecified chronic kidney disease: Secondary | ICD-10-CM | POA: Diagnosis not present

## 2016-07-14 DIAGNOSIS — E538 Deficiency of other specified B group vitamins: Secondary | ICD-10-CM

## 2016-07-14 DIAGNOSIS — M9907 Segmental and somatic dysfunction of upper extremity: Secondary | ICD-10-CM

## 2016-07-14 DIAGNOSIS — M9901 Segmental and somatic dysfunction of cervical region: Secondary | ICD-10-CM | POA: Diagnosis not present

## 2016-07-14 DIAGNOSIS — M419 Scoliosis, unspecified: Secondary | ICD-10-CM | POA: Diagnosis not present

## 2016-07-14 DIAGNOSIS — M9900 Segmental and somatic dysfunction of head region: Secondary | ICD-10-CM | POA: Diagnosis not present

## 2016-07-14 DIAGNOSIS — M436 Torticollis: Secondary | ICD-10-CM | POA: Diagnosis not present

## 2016-07-14 MED ORDER — CYANOCOBALAMIN 1000 MCG/ML IJ SOLN
1000.0000 ug | Freq: Once | INTRAMUSCULAR | Status: AC
  Administered 2016-07-14: 1000 ug via INTRAMUSCULAR

## 2016-07-14 MED ORDER — LISINOPRIL 5 MG PO TABS: 5.0000 mg | ORAL_TABLET | Freq: Every day | ORAL | 3 refills | Status: DC

## 2016-07-14 NOTE — Assessment & Plan Note (Signed)
Significantly better after steroids from orthopedics.  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 Seeing orthopedics for CT scan of her neck. She does have some somatic dysfunction today that I think is contributing to her symptoms. I think she would benefit from OMT. Treated today with fair results as below. Return PRN.

## 2016-07-14 NOTE — Assessment & Plan Note (Signed)
Never got refill of her lisinopril. Rx sent to mail order and local pharmacy. Slightly elevated today. Follow up with PCP.

## 2016-07-14 NOTE — Assessment & Plan Note (Signed)
Due for B12 injection Given today 

## 2016-07-14 NOTE — Progress Notes (Signed)
BP (!) 143/85 (BP Location: Left Arm, Patient Position: Sitting, Cuff Size: Normal)   Pulse 98   Wt 151 lb 11.2 oz (68.8 kg)   LMP  (LMP Unknown)   SpO2 100%   BMI 31.17 kg/m    Subjective:    [name], female    DOB: [birth year], 65 y.o.   MRN: [id number]  HPI: Jordan Chan is a 65 y.o. female  Chief Complaint  Patient presents with  . OMM  . RX Refill    Lisinopril   Doing so much better! Was given prednisone by the orthopedist and feels almost back to normal. Seeing them again on 12/28. She does note that she continues with some curvature of her neck, but it's less painful than it has been she notes that it is more sore and tight. Better with OMT and prednisone. Worse with stress and certain positions. No radiation. She is otherwise feeling well with no other concerns or complaints at this time  Relevant past medical, surgical, family and social history reviewed and updated as indicated. Interim medical history since our last visit reviewed. Allergies and medications reviewed and updated.  Review of Systems  Constitutional: Negative.   Respiratory: Negative.   Cardiovascular: Negative.   Musculoskeletal: Positive for arthralgias, myalgias, neck pain and neck stiffness. Negative for back pain, gait problem and joint swelling.  Psychiatric/Behavioral: Negative.     Per HPI unless specifically indicated above     Objective:    BP (!) 143/85 (BP Location: Left Arm, Patient Position: Sitting, Cuff Size: Normal)   Pulse 98   Wt 151 lb 11.2 oz (68.8 kg)   LMP  (LMP Unknown)   SpO2 100%   BMI 31.17 kg/m   Wt Readings from Last 3 Encounters:  07/14/16 151 lb 11.2 oz (68.8 kg)  06/12/16 150 lb 12.8 oz (68.4 kg)  05/30/16 149 lb 4.8 oz (67.7 kg)    Physical Exam  Constitutional: She is oriented to person, place, and time. She appears well-developed and well-nourished. No distress.  HENT:  Head: Normocephalic and atraumatic.  Right Ear:  Hearing normal.  Left Ear: Hearing normal.  Nose: Nose normal.  Eyes: Conjunctivae and lids are normal. Right eye exhibits no discharge. Left eye exhibits no discharge. No scleral icterus.  Pulmonary/Chest: Effort normal. No respiratory distress.  Abdominal: Soft. She exhibits no distension and no mass. There is no tenderness. There is no rebound and no guarding.  Musculoskeletal: Normal range of motion.  Neurological: She is alert and oriented to person, place, and time.  Skin: Skin is warm, dry and intact. No rash noted. No erythema. No pallor.  Psychiatric: She has a normal mood and affect. Her speech is normal and behavior is normal. Judgment and thought content normal. Cognition and memory are normal.  Nursing note and vitals reviewed. Musculoskeletal:  Exam found Decreased ROM, Tissue texture changes, Tenderness to palpation and Asymmetry of patient's  head, neck, thorax and upper extremity Osteopathic Structural Exam:   Head: hypertonic suboccipital muscles  Neck: SCM hypertonic on the L, scalenes hypertonic on the L, C4-5SLRR  Thorax: T2-3SLRR  Upper Extremity: Clavicle restricted on the L   Results for orders placed or performed in visit on 05/06/16  HM DIABETES EYE EXAM  Result Value Ref Range   HM Diabetic Eye Exam No Retinopathy No Retinopathy      Assessment & Plan:   Problem List Items Addressed This Visit      Cardiovascular and  Mediastinum   Benign hypertension with chronic kidney disease    Never got refill of her lisinopril. Rx sent to mail order and local pharmacy. Slightly elevated today. Follow up with PCP.       Relevant Medications   lisinopril (PRINIVIL,ZESTRIL) 5 MG tablet     Musculoskeletal and Integument   Torticollis    Significantly better after steroids from orthopedics.  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 Seeing orthopedics for CT scan of her neck. She does have some somatic dysfunction today that I think is  contributing to her symptoms. I think she would benefit from OMT. Treated today with fair results as below. Return PRN.      Cervical scoliosis    Following up with orthopedics. Significantly better following prednisone. Continue to follow with orthopedics.        Other   B12 deficiency - Primary    Due for B12 injection. Given today.      Relevant Medications   cyanocobalamin ((VITAMIN B-12)) injection 1,000 mcg (Completed)    Other Visit Diagnoses    Cranial somatic dysfunction       Cervical somatic dysfunction       Segmental dysfunction of upper extremity       Segmental dysfunction of thoracic region         After verbal consent was obtained, patient was treated today with osteopathic manipulative medicine to the regions of the head, neck, thorax and upper extremity using the techniques of FPR, myofascial release, counterstrain and soft tissue. Areas of compensation relating to her primary pain source also treated. Patient tolerated the procedure well with fair objective and fair subjective improvement in symptoms. She left the room in good condition. She was advised to stay well hydrated and that she may have some soreness following the procedure. She will return for reevaluation  on a PRN basis.   Follow up plan: Return As scheduled with Malachy Mood.

## 2016-07-14 NOTE — Assessment & Plan Note (Signed)
Following up with orthopedics. Significantly better following prednisone. Continue to follow with orthopedics.

## 2016-07-30 ENCOUNTER — Ambulatory Visit: Payer: Commercial Managed Care - HMO | Admitting: Unknown Physician Specialty

## 2016-08-05 ENCOUNTER — Encounter: Payer: Self-pay | Admitting: Unknown Physician Specialty

## 2016-08-05 ENCOUNTER — Ambulatory Visit (INDEPENDENT_AMBULATORY_CARE_PROVIDER_SITE_OTHER): Payer: Commercial Managed Care - HMO | Admitting: Unknown Physician Specialty

## 2016-08-05 VITALS — BP 131/75 | HR 98 | Temp 98.0°F | Wt 150.0 lb

## 2016-08-05 DIAGNOSIS — J449 Chronic obstructive pulmonary disease, unspecified: Secondary | ICD-10-CM | POA: Diagnosis not present

## 2016-08-05 DIAGNOSIS — E0822 Diabetes mellitus due to underlying condition with diabetic chronic kidney disease: Secondary | ICD-10-CM

## 2016-08-05 DIAGNOSIS — I129 Hypertensive chronic kidney disease with stage 1 through stage 4 chronic kidney disease, or unspecified chronic kidney disease: Secondary | ICD-10-CM

## 2016-08-05 DIAGNOSIS — Z794 Long term (current) use of insulin: Secondary | ICD-10-CM | POA: Diagnosis not present

## 2016-08-05 DIAGNOSIS — E782 Mixed hyperlipidemia: Secondary | ICD-10-CM | POA: Diagnosis not present

## 2016-08-05 DIAGNOSIS — N183 Chronic kidney disease, stage 3 unspecified: Secondary | ICD-10-CM

## 2016-08-05 DIAGNOSIS — Z23 Encounter for immunization: Secondary | ICD-10-CM | POA: Diagnosis not present

## 2016-08-05 LAB — BAYER DCA HB A1C WAIVED: HB A1C (BAYER DCA - WAIVED): 7.9 % — ABNORMAL HIGH (ref <7.0)

## 2016-08-05 MED ORDER — GLUCOSE BLOOD VI STRP: 1.0000 | ORAL_STRIP | Freq: Three times a day (TID) | 3 refills | Status: DC

## 2016-08-05 NOTE — Progress Notes (Signed)
BP 131/75 (BP Location: Left Arm, Patient Position: Sitting, Cuff Size: Normal)   Pulse 98   Temp 98 F (36.7 C)   Wt 150 lb (68 kg)   LMP  (LMP Unknown)   SpO2 93%   BMI 30.82 kg/m    Subjective:    [name], female    DOB: June 27, 1951, 66 y.o.   MRN: [id number]  HPI: Jordan Chan is a 66 y.o. female  Chief Complaint  Patient presents with  . Diabetes  . Hyperlipidemia  . Hypertension   Diabetes: Using medications without difficulties.  She took Prednisone for her neck 2-3 weeks ago.  She is taking about 32 units of Levimir.   States she get hypoglycemic with taking much more.   Feet problems: none Blood Sugars averaging: 140 in the AM eye exam within last year Last Hgb A1C: 7.7  Hypertension  Using medications without difficulty   Using medication without problems or lightheadedness No chest pain with exertion or shortness of breath No Edema  Elevated Cholesterol Using medications without problems No Muscle aches  Diet: "pretty good" Exercise: none     Relevant past medical, surgical, family and social history reviewed and updated as indicated. Interim medical history since our last visit reviewed. Allergies and medications reviewed and updated.  Review of Systems  Per HPI unless specifically indicated above     Objective:    BP 131/75 (BP Location: Left Arm, Patient Position: Sitting, Cuff Size: Normal)   Pulse 98   Temp 98 F (36.7 C)   Wt 150 lb (68 kg)   LMP  (LMP Unknown)   SpO2 93%   BMI 30.82 kg/m   Wt Readings from Last 3 Encounters:  08/05/16 150 lb (68 kg)  07/14/16 151 lb 11.2 oz (68.8 kg)  06/12/16 150 lb 12.8 oz (68.4 kg)    Physical Exam  Constitutional: She is oriented to person, place, and time. She appears well-developed and well-nourished. No distress.  HENT:  Head: Normocephalic and atraumatic.  Eyes: Conjunctivae and lids are normal. Right eye exhibits no discharge. Left eye exhibits no  discharge. No scleral icterus.  Neck: Normal range of motion. Neck supple. No JVD present. Carotid bruit is not present.  Cardiovascular: Normal rate, regular rhythm and normal heart sounds.   Pulmonary/Chest: Effort normal and breath sounds normal.  Abdominal: Normal appearance. There is no splenomegaly or hepatomegaly.  Musculoskeletal: Normal range of motion.  Neurological: She is alert and oriented to person, place, and time.  Skin: Skin is warm, dry and intact. No rash noted. No pallor.  Psychiatric: She has a normal mood and affect. Her behavior is normal. Judgment and thought content normal.    Results for orders placed or performed in visit on 05/06/16  HM DIABETES EYE EXAM  Result Value Ref Range   HM Diabetic Eye Exam No Retinopathy No Retinopathy      Assessment & Plan:   Problem List Items Addressed This Visit      Unprioritized   Benign hypertension with chronic kidney disease    Stable, continue present medications.        Diabetes mellitus with chronic kidney disease (HCC)    Hgb A1C is 7.9 which is reasonable considering she was on Prednisone for a few weeks.  Needs strip refills      Relevant Orders   Comprehensive metabolic panel   Bayer DCA Hb A1c Waived   Hyperlipidemia    Stable, continue present medications.  Other Visit Diagnoses    Need for pneumococcal vaccination    -  Primary   Relevant Orders   Pneumococcal polysaccharide vaccine 23-valent greater than or equal to 2yo subcutaneous/IM (Completed)     No changes as she was on Prednisone   Follow up plan: Return in about 3 months (around 11/03/2016).

## 2016-08-05 NOTE — Assessment & Plan Note (Signed)
Hgb A1C is 7.9 which is reasonable considering she was on Prednisone for a few weeks.  Needs strip refills

## 2016-08-05 NOTE — Assessment & Plan Note (Signed)
Stable, continue present medications.   

## 2016-08-05 NOTE — Patient Instructions (Signed)
Pneumococcal Polysaccharide Vaccine: What You Need to Know 1. Why get vaccinated? Vaccination can protect older adults (and some children and younger adults) from pneumococcal disease. Pneumococcal disease is caused by bacteria that can spread from person to person through close contact. It can cause ear infections, and it can also lead to more serious infections of the:  Lungs (pneumonia),  Blood (bacteremia), and  Covering of the brain and spinal cord (meningitis). Meningitis can cause deafness and brain damage, and it can be fatal. Anyone can get pneumococcal disease, but children under 2 years of age, people with certain medical conditions, adults over 65 years of age, and cigarette smokers are at the highest risk. About 18,000 older adults die each year from pneumococcal disease in the [place]. Treatment of pneumococcal infections with penicillin and other drugs used to be more effective. But some strains of the disease have become resistant to these drugs. This makes prevention of the disease, through vaccination, even more important. 2. Pneumococcal polysaccharide vaccine (PPSV23) Pneumococcal polysaccharide vaccine (PPSV23) protects against 23 types of pneumococcal bacteria. It will not prevent all pneumococcal disease. PPSV23 is recommended for:  All adults 65 years of age and older,  Anyone 2 through 67 years of age with certain long-term health problems,  Anyone 2 through 67 years of age with a weakened immune system,  Adults 19 through 67 years of age who smoke cigarettes or have asthma. Most people need only one dose of PPSV. A second dose is recommended for certain high-risk groups. People 65 and older should get a dose even if they have gotten one or more doses of the vaccine before they turned 65. Your healthcare provider can give you more information about these recommendations. Most healthy adults develop protection within 2 to 3 weeks of getting the shot. 3. Some  people should not get this vaccine  Anyone who has had a life-threatening allergic reaction to PPSV should not get another dose.  Anyone who has a severe allergy to any component of PPSV should not receive it. Tell your provider if you have any severe allergies.  Anyone who is moderately or severely ill when the shot is scheduled may be asked to wait until they recover before getting the vaccine. Someone with a mild illness can usually be vaccinated.  Children less than 2 years of age should not receive this vaccine.  There is no evidence that PPSV is harmful to either a pregnant woman or to her fetus. However, as a precaution, women who need the vaccine should be vaccinated before becoming pregnant, if possible. 4. Risks of a vaccine reaction With any medicine, including vaccines, there is a chance of side effects. These are usually mild and go away on their own, but serious reactions are also possible. About half of people who get PPSV have mild side effects, such as redness or pain where the shot is given, which go away within about two days. Less than 1 out of 100 people develop a fever, muscle aches, or more severe local reactions. Problems that could happen after any vaccine:  People sometimes faint after a medical procedure, including vaccination. Sitting or lying down for about 15 minutes can help prevent fainting, and injuries caused by a fall. Tell your doctor if you feel dizzy, or have vision changes or ringing in the ears.  Some people get severe pain in the shoulder and have difficulty moving the arm where a shot was given. This happens very rarely.  Any medication can   cause a severe allergic reaction. Such reactions from a vaccine are very rare, estimated at about 1 in a million doses, and would happen within a few minutes to a few hours after the vaccination. As with any medicine, there is a very remote chance of a vaccine causing a serious injury or death. The safety of  vaccines is always being monitored. For more information, visit: www.cdc.gov/vaccinesafety/ 5. What if there is a serious reaction? What should I look for? Look for anything that concerns you, such as signs of a severe allergic reaction, very high fever, or unusual behavior. Signs of a severe allergic reaction can include hives, swelling of the face and throat, difficulty breathing, a fast heartbeat, dizziness, and weakness. These would usually start a few minutes to a few hours after the vaccination. What should I do? If you think it is a severe allergic reaction or other emergency that can't wait, call 9-1-1 or get to the nearest hospital. Otherwise, call your doctor. Afterward, the reaction should be reported to the Vaccine Adverse Event Reporting System (VAERS). Your doctor might file this report, or you can do it yourself through the VAERS web site at www.vaers.hhs.gov, or by calling 1-800-822-7967. VAERS does not give medical advice. 6. How can I learn more?  Ask your doctor. He or she can give you the vaccine package insert or suggest other sources of information.  Call your local or state health department.  Contact the Centers for Disease Control and Prevention (CDC):  Call 1-800-232-4636 (1-800-CDC-INFO) or  Visit CDC's website at www.cdc.gov/vaccines CDC Pneumococcal Polysaccharide Vaccine VIS (11/25/13) This information is not intended to replace advice given to you by your health care provider. Make sure you discuss any questions you have with your health care provider. Document Released: 05/18/2006 Document Revised: 04/10/2016 Document Reviewed: 04/10/2016 Elsevier Interactive Patient Education  2017 [employer]  

## 2016-08-06 LAB — COMPREHENSIVE METABOLIC PANEL WITH GFR
ALT: 12 IU/L (ref 0–32)
AST: 12 IU/L (ref 0–40)
Alkaline Phosphatase: 60 IU/L (ref 39–117)
BUN: 12 mg/dL (ref 8–27)
Chloride: 101 mmol/L (ref 96–106)
Glucose: 139 mg/dL — ABNORMAL HIGH (ref 65–99)

## 2016-08-06 LAB — COMPREHENSIVE METABOLIC PANEL
Albumin/Globulin Ratio: 1.2 (ref 1.2–2.2)
Albumin: 4 g/dL (ref 3.6–4.8)
BUN/Creatinine Ratio: 19 (ref 12–28)
Bilirubin Total: 0.2 mg/dL (ref 0.0–1.2)
CO2: 26 mmol/L (ref 18–29)
Calcium: 9.2 mg/dL (ref 8.7–10.3)
Creatinine, Ser: 0.62 mg/dL (ref 0.57–1.00)
GFR calc Af Amer: 109 mL/min/{1.73_m2} (ref >59)
GFR calc non Af Amer: 94 mL/min/{1.73_m2} (ref >59)
Globulin, Total: 3.4 g/dL (ref 1.5–4.5)
Potassium: 5.6 mmol/L — ABNORMAL HIGH (ref 3.5–5.2)
Sodium: 141 mmol/L (ref 134–144)
Total Protein: 7.4 g/dL (ref 6.0–8.5)

## 2016-08-11 DIAGNOSIS — M545 Low back pain: Secondary | ICD-10-CM | POA: Diagnosis not present

## 2016-08-11 DIAGNOSIS — Z79899 Other long term (current) drug therapy: Secondary | ICD-10-CM | POA: Diagnosis not present

## 2016-08-11 DIAGNOSIS — G894 Chronic pain syndrome: Secondary | ICD-10-CM | POA: Diagnosis not present

## 2016-08-13 ENCOUNTER — Ambulatory Visit (INDEPENDENT_AMBULATORY_CARE_PROVIDER_SITE_OTHER): Payer: Commercial Managed Care - HMO

## 2016-08-13 DIAGNOSIS — E538 Deficiency of other specified B group vitamins: Secondary | ICD-10-CM

## 2016-08-13 MED ORDER — CYANOCOBALAMIN 1000 MCG/ML IJ SOLN
1000.0000 ug | Freq: Once | INTRAMUSCULAR | Status: AC
  Administered 2016-08-13: 1000 ug via INTRAMUSCULAR

## 2016-08-19 ENCOUNTER — Other Ambulatory Visit: Payer: Self-pay | Admitting: Family Medicine

## 2016-08-19 ENCOUNTER — Other Ambulatory Visit: Payer: Self-pay | Admitting: Unknown Physician Specialty

## 2016-09-01 DIAGNOSIS — J449 Chronic obstructive pulmonary disease, unspecified: Secondary | ICD-10-CM | POA: Diagnosis not present

## 2016-09-01 DIAGNOSIS — R0602 Shortness of breath: Secondary | ICD-10-CM | POA: Diagnosis not present

## 2016-09-05 DIAGNOSIS — J449 Chronic obstructive pulmonary disease, unspecified: Secondary | ICD-10-CM | POA: Diagnosis not present

## 2016-09-11 DIAGNOSIS — M542 Cervicalgia: Secondary | ICD-10-CM | POA: Diagnosis not present

## 2016-09-29 ENCOUNTER — Other Ambulatory Visit: Payer: Self-pay

## 2016-09-29 ENCOUNTER — Ambulatory Visit (INDEPENDENT_AMBULATORY_CARE_PROVIDER_SITE_OTHER): Payer: Medicare HMO

## 2016-09-29 DIAGNOSIS — E538 Deficiency of other specified B group vitamins: Secondary | ICD-10-CM | POA: Diagnosis not present

## 2016-09-29 MED ORDER — CYANOCOBALAMIN 1000 MCG/ML IJ SOLN
1000.0000 ug | Freq: Once | INTRAMUSCULAR | Status: AC
  Administered 2016-09-29: 1000 ug via INTRAMUSCULAR

## 2016-09-29 MED ORDER — DICLOFENAC SODIUM 50 MG PO TBEC: 50.0000 mg | DELAYED_RELEASE_TABLET | Freq: Three times a day (TID) | ORAL | 0 refills | Status: DC

## 2016-10-03 DIAGNOSIS — J449 Chronic obstructive pulmonary disease, unspecified: Secondary | ICD-10-CM | POA: Diagnosis not present

## 2016-10-20 DIAGNOSIS — Z9689 Presence of other specified functional implants: Secondary | ICD-10-CM | POA: Diagnosis not present

## 2016-10-20 DIAGNOSIS — G894 Chronic pain syndrome: Secondary | ICD-10-CM | POA: Diagnosis not present

## 2016-10-22 ENCOUNTER — Other Ambulatory Visit: Payer: Self-pay | Admitting: Family Medicine

## 2016-10-22 NOTE — Telephone Encounter (Signed)
Routing to provider. Appt on 11/03/16.

## 2016-11-03 ENCOUNTER — Ambulatory Visit (INDEPENDENT_AMBULATORY_CARE_PROVIDER_SITE_OTHER): Payer: Medicare HMO | Admitting: Unknown Physician Specialty

## 2016-11-03 ENCOUNTER — Encounter: Payer: Self-pay | Admitting: Unknown Physician Specialty

## 2016-11-03 VITALS — BP 116/73 | HR 99 | Temp 98.3°F | Wt 148.6 lb

## 2016-11-03 DIAGNOSIS — I129 Hypertensive chronic kidney disease with stage 1 through stage 4 chronic kidney disease, or unspecified chronic kidney disease: Secondary | ICD-10-CM

## 2016-11-03 DIAGNOSIS — J449 Chronic obstructive pulmonary disease, unspecified: Secondary | ICD-10-CM | POA: Diagnosis not present

## 2016-11-03 DIAGNOSIS — E785 Hyperlipidemia, unspecified: Secondary | ICD-10-CM | POA: Diagnosis not present

## 2016-11-03 DIAGNOSIS — E0822 Diabetes mellitus due to underlying condition with diabetic chronic kidney disease: Secondary | ICD-10-CM

## 2016-11-03 DIAGNOSIS — N183 Chronic kidney disease, stage 3 (moderate): Secondary | ICD-10-CM

## 2016-11-03 DIAGNOSIS — E538 Deficiency of other specified B group vitamins: Secondary | ICD-10-CM

## 2016-11-03 DIAGNOSIS — E1122 Type 2 diabetes mellitus with diabetic chronic kidney disease: Secondary | ICD-10-CM | POA: Diagnosis not present

## 2016-11-03 DIAGNOSIS — E782 Mixed hyperlipidemia: Secondary | ICD-10-CM

## 2016-11-03 DIAGNOSIS — Z794 Long term (current) use of insulin: Secondary | ICD-10-CM | POA: Diagnosis not present

## 2016-11-03 DIAGNOSIS — D649 Anemia, unspecified: Secondary | ICD-10-CM | POA: Diagnosis not present

## 2016-11-03 LAB — BAYER DCA HB A1C WAIVED: HB A1C (BAYER DCA - WAIVED): 7.5 % — ABNORMAL HIGH (ref <7.0)

## 2016-11-03 MED ORDER — CYANOCOBALAMIN 1000 MCG/ML IJ SOLN
1000.0000 ug | INTRAMUSCULAR | Status: DC
  Administered 2016-11-03 – 2017-04-13 (×5): 1000 ug via INTRAMUSCULAR

## 2016-11-03 NOTE — Assessment & Plan Note (Addendum)
Doing better with present medications.  Check CBC

## 2016-11-03 NOTE — Assessment & Plan Note (Signed)
Stable, continue present medications.   

## 2016-11-03 NOTE — Assessment & Plan Note (Signed)
Hgb A1C is 7.5, down from 7.9.  Will work on exercise and reck in 3 months

## 2016-11-03 NOTE — Progress Notes (Signed)
BP 116/73 (BP Location: Left Arm, Patient Position: Sitting, Cuff Size: Normal)   Pulse 99   Temp 98.3 F (36.8 C)   Wt 148 lb 9.6 oz (67.4 kg)   LMP  (LMP Unknown)   SpO2 91%   BMI 30.53 kg/m    Subjective:    [name], female    DOB: 10-17-1950, 66 y.o.   MRN: [id number]  HPI: Jordan Chan is a 66 y.o. female  Chief Complaint  Patient presents with  . Diabetes  . Hyperlipidemia  . Hypertension   Diabetes: Using medications without difficulties No hypoglycemic episodes No hyperglycemic episodes Feet problems: none Blood Sugars averaging:  120-280 eye exam within last year Last Hgb A1C: 7.9  Hypertension  Using medications without difficulty Average home BPs not checking   Using medication without problems or lightheadedness No chest pain with exertion or shortness of breath No Edema  Elevated Cholesterol Using medications without problems No Muscle aches  Diet/Exercise:Could do better but trouble with exercise due to weather and physical status.    COPD Doing well with present medications.  SOB off and on.  No ER visits  Relevant past medical, surgical, family and social history reviewed and updated as indicated. Interim medical history since our last visit reviewed. Allergies and medications reviewed and updated.  Review of Systems  Per HPI unless specifically indicated above     Objective:    BP 116/73 (BP Location: Left Arm, Patient Position: Sitting, Cuff Size: Normal)   Pulse 99   Temp 98.3 F (36.8 C)   Wt 148 lb 9.6 oz (67.4 kg)   LMP  (LMP Unknown)   SpO2 91%   BMI 30.53 kg/m   Wt Readings from Last 3 Encounters:  11/03/16 148 lb 9.6 oz (67.4 kg)  08/05/16 150 lb (68 kg)  07/14/16 151 lb 11.2 oz (68.8 kg)    Physical Exam  Constitutional: She is oriented to person, place, and time. She appears well-developed and well-nourished. No distress.  HENT:  Head: Normocephalic and atraumatic.  Eyes: Conjunctivae  and lids are normal. Right eye exhibits no discharge. Left eye exhibits no discharge. No scleral icterus.  Neck: Normal range of motion. Neck supple. No JVD present. Carotid bruit is not present.  Cardiovascular: Normal rate, regular rhythm and normal heart sounds.   Pulmonary/Chest: Effort normal and breath sounds normal.  Abdominal: Normal appearance. There is no splenomegaly or hepatomegaly.  Musculoskeletal: Normal range of motion.  Neurological: She is alert and oriented to person, place, and time.  Skin: Skin is warm, dry and intact. No rash noted. No pallor.  Psychiatric: She has a normal mood and affect. Her behavior is normal. Judgment and thought content normal.    Results for orders placed or performed in visit on 08/05/16  Comprehensive metabolic panel  Result Value Ref Range   Glucose 139 (H) 65 - 99 mg/dL   BUN 12 8 - 27 mg/dL   Creatinine, Ser 0.62 0.57 - 1.00 mg/dL   GFR calc non Af Amer 94 >59 mL/min/1.73   GFR calc Af Amer 109 >59 mL/min/1.73   BUN/Creatinine Ratio 19 12 - 28   Sodium 141 134 - 144 mmol/L   Potassium 5.6 (H) 3.5 - 5.2 mmol/L   Chloride 101 96 - 106 mmol/L   CO2 26 18 - 29 mmol/L   Calcium 9.2 8.7 - 10.3 mg/dL   Total Protein 7.4 6.0 - 8.5 g/dL   Albumin 4.0 3.6 -  4.8 g/dL   Globulin, Total 3.4 1.5 - 4.5 g/dL   Albumin/Globulin Ratio 1.2 1.2 - 2.2   Bilirubin Total 0.2 0.0 - 1.2 mg/dL   Alkaline Phosphatase 60 39 - 117 IU/L   AST 12 0 - 40 IU/L   ALT 12 0 - 32 IU/L  Bayer DCA Hb A1c Waived  Result Value Ref Range   Bayer DCA Hb A1c Waived 7.9 (H) <7.0 %     Assessment & Plan:   Problem List Items Addressed This Visit      Unprioritized   B12 deficiency - Primary    Doing better with present medications.  Check CBC      Relevant Medications   cyanocobalamin ((VITAMIN B-12)) injection 1,000 mcg   Other Relevant Orders   CBC with Differential/Platelet   Benign hypertension with chronic kidney disease    Stable, continue present  medications.        Benign hypertensive renal disease   Diabetes mellitus with chronic kidney disease (HCC)    Hgb A1C is 7.5, down from 7.9.  Will work on exercise and reck in 3 months      Relevant Orders   Comprehensive metabolic panel   Bayer DCA Hb A1c Waived   CBC with Differential/Platelet   Hyperlipidemia    Await lipid panel.  Continue present medications.        Relevant Orders   Lipid Panel w/o Chol/HDL Ratio   Moderate COPD (chronic obstructive pulmonary disease) (HCC)    Stable, continue present medications.            Follow up plan: No Follow-up on file.  Recheck 6 months

## 2016-11-03 NOTE — Assessment & Plan Note (Addendum)
Await lipid panel.  Continue present medications.

## 2016-11-04 ENCOUNTER — Other Ambulatory Visit: Payer: Self-pay | Admitting: Unknown Physician Specialty

## 2016-11-04 DIAGNOSIS — D649 Anemia, unspecified: Secondary | ICD-10-CM | POA: Insufficient documentation

## 2016-11-04 LAB — CBC WITH DIFFERENTIAL/PLATELET
Basophils Absolute: 0.1 10*3/uL (ref 0.0–0.2)
Basos: 1 %
EOS (ABSOLUTE): 0.3 10*3/uL (ref 0.0–0.4)
Eos: 3 %
Hematocrit: 32.8 % — ABNORMAL LOW (ref 34.0–46.6)
Hemoglobin: 10.5 g/dL — ABNORMAL LOW (ref 11.1–15.9)
Immature Grans (Abs): 0 10*3/uL (ref 0.0–0.1)
Immature Granulocytes: 0 %
Lymphocytes Absolute: 2.6 10*3/uL (ref 0.7–3.1)
Lymphs: 28 %
MCH: 24.6 pg — ABNORMAL LOW (ref 26.6–33.0)
MCHC: 32 g/dL (ref 31.5–35.7)
MCV: 77 fL — ABNORMAL LOW (ref 79–97)
Monocytes Absolute: 0.9 10*3/uL (ref 0.1–0.9)
Monocytes: 10 %
Neutrophils Absolute: 5.4 10*3/uL (ref 1.4–7.0)
Neutrophils: 58 %
Platelets: 532 10*3/uL — ABNORMAL HIGH (ref 150–379)
RBC: 4.26 x10E6/uL (ref 3.77–5.28)
RDW: 16.1 % — ABNORMAL HIGH (ref 12.3–15.4)
WBC: 9.3 10*3/uL (ref 3.4–10.8)

## 2016-11-04 LAB — COMPREHENSIVE METABOLIC PANEL WITH GFR
ALT: 8 [IU]/L (ref 0–32)
AST: 14 [IU]/L (ref 0–40)
Albumin/Globulin Ratio: 1 — ABNORMAL LOW (ref 1.2–2.2)
Albumin: 4.1 g/dL (ref 3.6–4.8)
Alkaline Phosphatase: 68 [IU]/L (ref 39–117)
BUN/Creatinine Ratio: 19 (ref 12–28)
BUN: 11 mg/dL (ref 8–27)
Bilirubin Total: 0.3 mg/dL (ref 0.0–1.2)
CO2: 27 mmol/L (ref 18–29)
Calcium: 9.6 mg/dL (ref 8.7–10.3)
Chloride: 97 mmol/L (ref 96–106)
Creatinine, Ser: 0.58 mg/dL (ref 0.57–1.00)
GFR calc Af Amer: 111 mL/min/{1.73_m2}
GFR calc non Af Amer: 96 mL/min/{1.73_m2}
Globulin, Total: 4 g/dL (ref 1.5–4.5)
Glucose: 147 mg/dL — ABNORMAL HIGH (ref 65–99)
Potassium: 5.4 mmol/L — ABNORMAL HIGH (ref 3.5–5.2)
Sodium: 139 mmol/L (ref 134–144)
Total Protein: 8.1 g/dL (ref 6.0–8.5)

## 2016-11-04 LAB — LIPID PANEL W/O CHOL/HDL RATIO
Cholesterol, Total: 129 mg/dL (ref 100–199)
HDL: 59 mg/dL (ref >39)
LDL Calculated: 56 mg/dL (ref 0–99)
Triglycerides: 69 mg/dL (ref 0–149)
VLDL Cholesterol Cal: 14 mg/dL (ref 5–40)

## 2016-11-07 ENCOUNTER — Telehealth: Payer: Self-pay | Admitting: Unknown Physician Specialty

## 2016-11-07 DIAGNOSIS — D509 Iron deficiency anemia, unspecified: Secondary | ICD-10-CM

## 2016-11-07 NOTE — Telephone Encounter (Signed)
Discussed anemia.  Iron deficiency noted.  Refer to GI.  Start iron therapy.  Slo-Fe.  Take twice a day

## 2016-11-07 NOTE — Telephone Encounter (Signed)
Appointment scheduled 11/14/16.

## 2016-11-11 LAB — B12 AND FOLATE PANEL
Folate: 2.9 ng/mL — ABNORMAL LOW (ref >3.0)
Vitamin B-12: 385 pg/mL (ref 232–1245)

## 2016-11-11 LAB — IRON AND TIBC
Iron Saturation: 6 % — CL (ref 15–55)
Iron: 28 ug/dL (ref 27–139)
Total Iron Binding Capacity: 437 ug/dL (ref 250–450)
UIBC: 409 ug/dL — ABNORMAL HIGH (ref 118–369)

## 2016-11-11 LAB — SPECIMEN STATUS REPORT

## 2016-11-11 LAB — FERRITIN: Ferritin: 6 ng/mL — ABNORMAL LOW (ref 15–150)

## 2016-11-14 ENCOUNTER — Ambulatory Visit (INDEPENDENT_AMBULATORY_CARE_PROVIDER_SITE_OTHER): Payer: Medicare HMO | Admitting: Unknown Physician Specialty

## 2016-11-14 ENCOUNTER — Encounter: Payer: Self-pay | Admitting: Unknown Physician Specialty

## 2016-11-14 ENCOUNTER — Ambulatory Visit: Payer: Medicare HMO | Admitting: Unknown Physician Specialty

## 2016-11-14 VITALS — BP 135/71 | HR 102 | Temp 98.1°F | Ht 59.6 in | Wt 150.1 lb

## 2016-11-14 DIAGNOSIS — Z7189 Other specified counseling: Secondary | ICD-10-CM | POA: Insufficient documentation

## 2016-11-14 DIAGNOSIS — D509 Iron deficiency anemia, unspecified: Secondary | ICD-10-CM | POA: Diagnosis not present

## 2016-11-14 DIAGNOSIS — Z0000 Encounter for general adult medical examination without abnormal findings: Secondary | ICD-10-CM

## 2016-11-14 LAB — CBC WITH DIFFERENTIAL/PLATELET
Hematocrit: 35 % (ref 34.0–46.6)
Hemoglobin: 10.5 g/dL — ABNORMAL LOW (ref 11.1–15.9)
Lymphocytes Absolute: 3.2 10*3/uL — ABNORMAL HIGH (ref 0.7–3.1)
Lymphs: 33 %
MCH: 24.1 pg — ABNORMAL LOW (ref 26.6–33.0)
MCHC: 30 g/dL — ABNORMAL LOW (ref 31.5–35.7)
MCV: 81 fL (ref 79–97)
MID (Absolute): 1.3 10*3/uL (ref 0.1–1.6)
MID: 13 %
Neutrophils Absolute: 5.2 10*3/uL (ref 1.4–7.0)
Neutrophils: 54 %
Platelets: 395 10*3/uL — ABNORMAL HIGH (ref 150–379)
RBC: 4.35 x10E6/uL (ref 3.77–5.28)
RDW: 17.9 % — ABNORMAL HIGH (ref 12.3–15.4)
WBC: 9.7 10*3/uL (ref 3.4–10.8)

## 2016-11-14 NOTE — Assessment & Plan Note (Addendum)
Iron deficiency.  Will try to get an earlier GI appointment.  H/H today is 10.5/35.  Similar to previous but no worse.  Continue with iron

## 2016-11-14 NOTE — Assessment & Plan Note (Signed)
A voluntary discussion about advance care planning including the explanation and discussion of advance directives was extensively discussed  with the patient.    She has completed forms and she will ask her husband to bring them in to be scanned.  During this discussion, the patient was able to identify a health care proxy as her husband Louie Casa.  Patient was offered a separate [place] visit for further assistance with forms.

## 2016-11-14 NOTE — Progress Notes (Signed)
BP 135/71 (BP Location: Left Arm, Patient Position: Sitting, Cuff Size: Normal)   Pulse (!) 102   Temp 98.1 F (36.7 C)   Ht 4' 11.6" (1.514 m)   Wt 150 lb 1.6 oz (68.1 kg)   LMP  (LMP Unknown)   SpO2 92%   BMI 29.71 kg/m    Subjective:    [name], female    DOB: Mar 14, 1951, 66 y.o.   MRN: [id number]  HPI: Jordan Chan is a 66 y.o. female  Chief Complaint  Patient presents with  . Medicare Wellness   Functional Status Survey: Is the patient deaf or have difficulty hearing?: No Does the patient have difficulty seeing, even when wearing glasses/contacts?: Yes (sometimes) Does the patient have difficulty concentrating, remembering, or making decisions?: Yes Does the patient have difficulty walking or climbing stairs?: No Does the patient have difficulty dressing or bathing?: No Does the patient have difficulty doing errands alone such as visiting a doctor's office or shopping?: No  Depression screen [facility] 2/9 11/14/2016 07/14/2016 06/18/2015 06/18/2015  Decreased Interest 0 1 1 1   Down, Depressed, Hopeless 1 0 2 2  PHQ - 2 Score 1 1 3 3   Altered sleeping 3 2 2 2   Tired, decreased energy 3 2 2 2   Change in appetite 2 0 0 0  Feeling bad or failure about yourself  0 0 0 0  Trouble concentrating 2 1 2 2   Moving slowly or fidgety/restless 0 0 0 0  Suicidal thoughts 0 0 0 0  PHQ-9 Score 11 6 9 9    Fall Risk  11/14/2016 07/14/2016 06/18/2015 06/18/2015  Falls in the past year? No No No No   Mini cog is negative  New since last visit:  Anemia New since last visit.  Unable to see GI until May.  Will try to get another appointment.  Taking 2 iron tablets/day.    Social History   Social History  . Marital status: Legally Separated    Spouse name: N/A  . Number of children: N/A  . Years of education: N/A   Occupational History  . Not on file.   Social History Main Topics  . Smoking status: Former Smoker    Types: Cigarettes    Quit date:  04/30/2004  . Smokeless tobacco: Never Used  . Alcohol use No  . Drug use: No  . Sexual activity: Not Currently    Birth control/ protection: Post-menopausal   Other Topics Concern  . Not on file   Social History Narrative  . No narrative on file   Family History  Problem Relation Age of Onset  . Cancer Mother 48    lung  . Thyroid disease Mother   . Cancer Father 76    Pancreatic  . Hypertension Sister   . Cancer Sister     "cancer on face"  . Hyperlipidemia Sister   . Osteoporosis Maternal Grandmother   . Hyperlipidemia Son   . Seizures Son   . Cancer Paternal Grandmother     colon  . Arthritis Paternal Grandfather    Past Medical History:  Diagnosis Date  . Arthritis   . Breast cancer ([place]) 1998   right breast ca with mastectomy and chemotherapy and radiation  . Cancer ([place]) 1998   masectomy  . Endometriosis   . GERD (gastroesophageal reflux disease)   . IBS (irritable bowel syndrome)   . Stroke [facility]    Past Surgical History:  Procedure Laterality Date  .  ABDOMINAL HYSTERECTOMY  1987  . BACK SURGERY     Tailbone removed following fracture  . ELBOW ARTHROSCOPY WITH TENDON RECONSTRUCTION    . MASTECTOMY  06/1997  . morphine pump  2011  . PLANTAR FASCIA RELEASE      Relevant past medical, surgical, family and social history reviewed and updated as indicated. Interim medical history since our last visit reviewed. Allergies and medications reviewed and updated.  Review of Systems  Per HPI unless specifically indicated above     Objective:    BP 135/71 (BP Location: Left Arm, Patient Position: Sitting, Cuff Size: Normal)   Pulse (!) 102   Temp 98.1 F (36.7 C)   Ht 4' 11.6" (1.514 m)   Wt 150 lb 1.6 oz (68.1 kg)   LMP  (LMP Unknown)   SpO2 92%   BMI 29.71 kg/m   Wt Readings from Last 3 Encounters:  11/14/16 150 lb 1.6 oz (68.1 kg)  11/03/16 148 lb 9.6 oz (67.4 kg)  08/05/16 150 lb (68 kg)    Physical Exam  Constitutional: She is oriented to  person, place, and time. She appears well-developed and well-nourished.  HENT:  Head: Normocephalic and atraumatic.  Eyes: Pupils are equal, round, and reactive to light. Right eye exhibits no discharge. Left eye exhibits no discharge. No scleral icterus.  Neck: Normal range of motion. Neck supple. Carotid bruit is not present. No thyromegaly present.  Cardiovascular: Normal rate, regular rhythm and normal heart sounds.  Exam reveals no gallop and no friction rub.   No murmur heard. Pulmonary/Chest: Effort normal and breath sounds normal. No respiratory distress. She has no wheezes. She has no rales.  Abdominal: Soft. Bowel sounds are normal. There is no tenderness. There is no rebound.  Genitourinary: No breast swelling, tenderness or discharge.  Musculoskeletal: Normal range of motion.  Lymphadenopathy:    She has no cervical adenopathy.  Neurological: She is alert and oriented to person, place, and time.  Skin: Skin is warm, dry and intact. No rash noted.  Psychiatric: She has a normal mood and affect. Her speech is normal and behavior is normal. Judgment and thought content normal. Cognition and memory are normal.    Results for orders placed or performed in visit on 11/03/16  Comprehensive metabolic panel  Result Value Ref Range   Glucose 147 (H) 65 - 99 mg/dL   BUN 11 8 - 27 mg/dL   Creatinine, Ser 0.58 0.57 - 1.00 mg/dL   GFR calc non Af Amer 96 >59 mL/min/1.73   GFR calc Af Amer 111 >59 mL/min/1.73   BUN/Creatinine Ratio 19 12 - 28   Sodium 139 134 - 144 mmol/L   Potassium 5.4 (H) 3.5 - 5.2 mmol/L   Chloride 97 96 - 106 mmol/L   CO2 27 18 - 29 mmol/L   Calcium 9.6 8.7 - 10.3 mg/dL   Total Protein 8.1 6.0 - 8.5 g/dL   Albumin 4.1 3.6 - 4.8 g/dL   Globulin, Total 4.0 1.5 - 4.5 g/dL   Albumin/Globulin Ratio 1.0 (L) 1.2 - 2.2   Bilirubin Total 0.3 0.0 - 1.2 mg/dL   Alkaline Phosphatase 68 39 - 117 IU/L   AST 14 0 - 40 IU/L   ALT 8 0 - 32 IU/L  Bayer DCA Hb A1c Waived    Result Value Ref Range   Bayer DCA Hb A1c Waived 7.5 (H) <7.0 %  Lipid Panel w/o Chol/HDL Ratio  Result Value Ref Range   Cholesterol, Total 129  100 - 199 mg/dL   Triglycerides 69 0 - 149 mg/dL   HDL 59 >39 mg/dL   VLDL Cholesterol Cal 14 5 - 40 mg/dL   LDL Calculated 56 0 - 99 mg/dL  CBC with Differential/Platelet  Result Value Ref Range   WBC 9.3 3.4 - 10.8 x10E3/uL   RBC 4.26 3.77 - 5.28 x10E6/uL   Hemoglobin 10.5 (L) 11.1 - 15.9 g/dL   Hematocrit 32.8 (L) 34.0 - 46.6 %   MCV 77 (L) 79 - 97 fL   MCH 24.6 (L) 26.6 - 33.0 pg   MCHC 32.0 31.5 - 35.7 g/dL   RDW 16.1 (H) 12.3 - 15.4 %   Platelets 532 (H) 150 - 379 x10E3/uL   Neutrophils 58 Not Estab. %   Lymphs 28 Not Estab. %   Monocytes 10 Not Estab. %   Eos 3 Not Estab. %   Basos 1 Not Estab. %   Neutrophils Absolute 5.4 1.4 - 7.0 x10E3/uL   Lymphocytes Absolute 2.6 0.7 - 3.1 x10E3/uL   Monocytes Absolute 0.9 0.1 - 0.9 x10E3/uL   EOS (ABSOLUTE) 0.3 0.0 - 0.4 x10E3/uL   Basophils Absolute 0.1 0.0 - 0.2 x10E3/uL   Immature Granulocytes 0 Not Estab. %   Immature Grans (Abs) 0.0 0.0 - 0.1 x10E3/uL  Iron and TIBC  Result Value Ref Range   Total Iron Binding Capacity 437 250 - 450 ug/dL   UIBC 409 (H) 118 - 369 ug/dL   Iron 28 27 - 139 ug/dL   Iron Saturation 6 (LL) 15 - 55 %  B12 and Folate Panel  Result Value Ref Range   Vitamin B-12 385 232 - 1,245 pg/mL   Folate 2.9 (L) >3.0 ng/mL  Ferritin  Result Value Ref Range   Ferritin 6 (L) 15 - 150 ng/mL  Specimen status report  Result Value Ref Range   specimen status report Comment       Assessment & Plan:   Problem List Items Addressed This Visit      Unprioritized   Advanced care planning/counseling discussion    A voluntary discussion about advance care planning including the explanation and discussion of advance directives was extensively discussed  with the patient.    She has completed forms and she will ask her husband to bring them in to be scanned.  During  this discussion, the patient was able to identify a health care proxy as her husband Louie Casa.  Patient was offered a separate [place] visit for further assistance with forms.         Anemia    Iron deficiency.  Will try to get an earlier GI appointment.  H/H today is 10.5/35.  Similar to previous but no worse.  Continue with iron      Relevant Medications   Ferrous Sulfate (SLOW FE PO)   Other Relevant Orders   CBC With Differential/Platelet    Other Visit Diagnoses    Annual physical exam    -  Primary       Follow up plan: Return in about 4 weeks (around 12/12/2016).

## 2016-12-01 ENCOUNTER — Other Ambulatory Visit: Payer: Self-pay | Admitting: Unknown Physician Specialty

## 2016-12-03 DIAGNOSIS — J449 Chronic obstructive pulmonary disease, unspecified: Secondary | ICD-10-CM | POA: Diagnosis not present

## 2016-12-11 ENCOUNTER — Telehealth: Payer: Self-pay

## 2016-12-11 ENCOUNTER — Encounter (INDEPENDENT_AMBULATORY_CARE_PROVIDER_SITE_OTHER): Payer: Self-pay

## 2016-12-11 ENCOUNTER — Ambulatory Visit (INDEPENDENT_AMBULATORY_CARE_PROVIDER_SITE_OTHER): Payer: Medicare HMO | Admitting: Gastroenterology

## 2016-12-11 ENCOUNTER — Other Ambulatory Visit: Payer: Self-pay

## 2016-12-11 ENCOUNTER — Encounter: Payer: Self-pay | Admitting: Gastroenterology

## 2016-12-11 ENCOUNTER — Other Ambulatory Visit
Admission: RE | Admit: 2016-12-11 | Discharge: 2016-12-11 | Disposition: A | Discharge status: Home / Self Care | Payer: Medicare HMO | Source: Ambulatory Visit | Attending: Gastroenterology | Admitting: Gastroenterology

## 2016-12-11 VITALS — BP 118/73 | HR 111 | Temp 98.1°F | Ht 59.0 in | Wt 151.6 lb

## 2016-12-11 DIAGNOSIS — E538 Deficiency of other specified B group vitamins: Secondary | ICD-10-CM

## 2016-12-11 DIAGNOSIS — D509 Iron deficiency anemia, unspecified: Secondary | ICD-10-CM | POA: Diagnosis not present

## 2016-12-11 LAB — URINALYSIS, ROUTINE W REFLEX MICROSCOPIC
Bacteria, UA: NONE SEEN
Bilirubin Urine: NEGATIVE
Glucose, UA: >=500 mg/dL — AB
Hgb urine dipstick: NEGATIVE
Ketones, ur: NEGATIVE mg/dL
Leukocytes, UA: NEGATIVE
Nitrite: NEGATIVE
Protein, ur: NEGATIVE mg/dL
Specific Gravity, Urine: 1.018 (ref 1.005–1.030)
pH: 6 (ref 5.0–8.0)

## 2016-12-11 MED ORDER — FOLIC ACID 1 MG PO TABS: 1.0000 mg | ORAL_TABLET | Freq: Every day | ORAL | 3 refills | Status: AC

## 2016-12-11 MED ORDER — FERROUS SULFATE 325 (65 FE) MG PO TABS: 325.0000 mg | ORAL_TABLET | Freq: Two times a day (BID) | ORAL | 0 refills | Status: DC

## 2016-12-11 NOTE — Telephone Encounter (Signed)
Gastroenterology Pre-Procedure Review  Request Date: 5/31 Requesting Physician: Dr. Vicente Males  PATIENT REVIEW QUESTIONS: The patient responded to the following health history questions as indicated:    1. Are you having any GI issues? yes (anemia) 2. Do you have a personal history of Polyps? yes (removed) 3. Do you have a family history of Colon Cancer or Polyps? yes (Paternal GM: colon cancer) 4. Diabetes Mellitus? yes (Type II) 5. Joint replacements in the past 12 months?no 6. Major health problems in the past 3 months?no 7. Any artificial heart valves, MVP, or defibrillator?no    MEDICATIONS & ALLERGIES:    Patient reports the following regarding taking any anticoagulation/antiplatelet therapy:   Plavix, Coumadin, Eliquis, Xarelto, Lovenox, Pradaxa, Brilinta, or Effient? no Aspirin? yes (81mg )  Patient confirms/reports the following medications:  Current Outpatient Prescriptions  Medication Sig Dispense Refill  . ACCU-CHEK SOFTCLIX LANCETS lancets CHECK ONCE A DAY 100 each 3  . ADVAIR DISKUS 250-50 MCG/DOSE AEPB INHALE 1 PUFF TWICE DAILY 180 each 3  . albuterol (PROVENTIL HFA;VENTOLIN HFA) 108 (90 BASE) MCG/ACT inhaler Inhale 2 puffs into the lungs every 6 (six) hours as needed for wheezing or shortness of breath.    [name] aspirin EC 81 MG tablet Take 81 mg by mouth daily.    [name] atorvastatin (LIPITOR) 20 MG tablet TAKE 1 TABLET EVERY DAY 90 tablet 1  . cyclobenzaprine (FLEXERIL) 10 MG tablet Take 1 tablet (10 mg total) by mouth at bedtime. 30 tablet 1  . diclofenac (VOLTAREN) 50 MG EC tablet TAKE 1 TABLET THREE TIMES DAILY (SUBSTITUTED FOR VOLTAREN) 270 tablet 0  . Ferrous Sulfate (SLOW FE PO) Take by mouth 2 (two) times daily.    . fluconazole (DIFLUCAN) 150 MG tablet fluconazole 150 mg tablet    . folic acid (FOLVITE) 1 MG tablet Take 1 tablet (1 mg total) by mouth daily. 30 tablet 3  . gabapentin (NEURONTIN) 800 MG tablet TAKE 1 TABLET FOUR TIMES DAILY 360 tablet 0  . glucose blood  (ACCU-CHEK AVIVA PLUS) test strip 1 each by Other route 3 (three) times daily. Use as instructed 100 each 3  . JANUVIA 100 MG tablet TAKE 1 TABLET EVERY DAY 90 tablet 0  . LEVEMIR FLEXTOUCH 100 UNIT/ML Pen INJECT 42 UNITS SUBCUTANEOUSLY AT BEDTIME. (Patient taking differently: INJECT 32 UNITS SUBCUTANEOUSLY AT BEDTIME.) 5 pen 12  . lisinopril (PRINIVIL,ZESTRIL) 5 MG tablet Take 1 tablet (5 mg total) by mouth daily. 90 tablet 3  . metFORMIN (GLUCOPHAGE-XR) 500 MG 24 hr tablet TAKE 2 TABLETS TWICE DAILY 360 tablet 1  . predniSONE (DELTASONE) 5 MG tablet prednisone 5 mg tablet    . tiotropium (SPIRIVA HANDIHALER) 18 MCG inhalation capsule Place 18 mcg into inhaler and inhale daily.    [name] venlafaxine XR (EFFEXOR-XR) 150 MG 24 hr capsule TAKE 1 CAPSULE EVERY DAY 90 capsule 3   Current Facility-Administered Medications  Medication Dose Route Frequency Provider Last Rate Last Dose  . cyanocobalamin ((VITAMIN B-12)) injection 1,000 mcg  1,000 mcg Intramuscular Q30 days Kathrine Haddock, NP   1,000 mcg at 11/03/16 3810    Patient confirms/reports the following allergies:  Allergies  Allergen Reactions  . Other Palpitations    IV steroids  . Pain Patch [Menthol] Anaphylaxis  . Avelox [Moxifloxacin Hcl In Nacl] Other (See Comments)    Upset stomach  . Erythromycin Nausea Only and Other (See Comments)    Can take a Z-Pak just fine  . Fentanyl   . Oxycontin [Oxycodone] Hives  No orders of the defined types were placed in this encounter.   AUTHORIZATION INFORMATION Primary Insurance: 1D#: Group #:  Secondary Insurance: 1D#: Group #:  SCHEDULE INFORMATION: Date: 5/31 Time: Location: [place]

## 2016-12-11 NOTE — Progress Notes (Signed)
Gastroenterology Consultation  Referring Provider:     Kathrine Haddock, NP Primary Care Physician:  Kathrine Haddock, NP Primary Gastroenterologist:  Dr. Jonathon Bellows  Reason for Consultation:     Iron deficiency anemia         HPI:   Jordan Chan is a 66 y.o. y/o female referred for consultation & management  by Dr. Kathrine Haddock, NP.    She has been referred for iron deficiency anemia.   Labs 11/03/2016-Ferritin low at 6, iron % sat was low at 6  , low folate and normal B12. Hb 10.5 and MCV 77  Says this was just found out.  Rectal bleeding: No  Nose bleeds: No  Vaginal bleeding : no  Hematemesis or hemoptysis : no  Blood in urine : no  Apetite good, very rare gas/abdominal pain , after she eats.   Last colonoscopy many years back and was told she had diverticulosis . EGD some years back - she cant recall why - but possible due to abdominal pain when she was hospitalized with diverticulitis.  Her grandmother had colon cancer, she has had some colon polyps in the past .     Past Medical History:  Diagnosis Date  . Arthritis   . Breast cancer ([place]) 1998   right breast ca with mastectomy and chemotherapy and radiation  . Cancer ([place]) 1998   masectomy  . Endometriosis   . GERD (gastroesophageal reflux disease)   . IBS (irritable bowel syndrome)   . Stroke [facility])     Past Surgical History:  Procedure Laterality Date  . ABDOMINAL HYSTERECTOMY  1987  . BACK SURGERY     Tailbone removed following fracture  . ELBOW ARTHROSCOPY WITH TENDON RECONSTRUCTION    . MASTECTOMY  06/1997  . morphine pump  2011  . PLANTAR FASCIA RELEASE      Prior to Admission medications   Medication Sig Start Date End Date Taking? Authorizing Provider  ACCU-CHEK SOFTCLIX LANCETS lancets CHECK ONCE A DAY 04/02/16  Yes Volney American, [place]  ADVAIR DISKUS 250-50 MCG/DOSE AEPB INHALE 1 PUFF TWICE DAILY 02/18/16  Yes Kathrine Haddock, NP  albuterol (PROVENTIL HFA;VENTOLIN HFA) 108 (90 BASE)  MCG/ACT inhaler Inhale 2 puffs into the lungs every 6 (six) hours as needed for wheezing or shortness of breath.   Yes [provider]  aspirin EC 81 MG tablet Take 81 mg by mouth daily.   Yes [provider]  atorvastatin (LIPITOR) 20 MG tablet TAKE 1 TABLET EVERY DAY 08/19/16  Yes Johnson, Megan P, DO  cyclobenzaprine (FLEXERIL) 10 MG tablet Take 1 tablet (10 mg total) by mouth at bedtime. 04/29/16  Yes Johnson, Megan P, DO  diclofenac (VOLTAREN) 50 MG EC tablet TAKE 1 TABLET THREE TIMES DAILY (SUBSTITUTED FOR VOLTAREN) 12/01/16  Yes Kathrine Haddock, NP  Ferrous Sulfate (SLOW FE PO) Take by mouth 2 (two) times daily.   Yes [provider]  fluconazole (DIFLUCAN) 150 MG tablet fluconazole 150 mg tablet   Yes [provider]  folic acid (FOLVITE) 1 MG tablet Take 1 tablet (1 mg total) by mouth daily. 12/11/16 03/13/17 Yes Jonathon Bellows, MD  gabapentin (NEURONTIN) 800 MG tablet TAKE 1 TABLET FOUR TIMES DAILY 04/09/16  Yes Volney American, PA-C  glucose blood (ACCU-CHEK AVIVA PLUS) test strip 1 each by Other route 3 (three) times daily. Use as instructed 08/05/16  Yes Kathrine Haddock, NP  JANUVIA 100 MG tablet TAKE 1 TABLET EVERY DAY 10/22/16  Yes Julian Hy,  Malachy Mood, NP  LEVEMIR FLEXTOUCH 100 UNIT/ML Pen INJECT 42 UNITS SUBCUTANEOUSLY AT BEDTIME. Patient taking differently: INJECT 32 UNITS SUBCUTANEOUSLY AT BEDTIME. 04/08/16  Yes Crissman, Jeannette How, MD  lisinopril (PRINIVIL,ZESTRIL) 5 MG tablet Take 1 tablet (5 mg total) by mouth daily. 07/14/16  Yes Johnson, Megan P, DO  metFORMIN (GLUCOPHAGE-XR) 500 MG 24 hr tablet TAKE 2 TABLETS TWICE DAILY 08/19/16  Yes Kathrine Haddock, NP  predniSONE (DELTASONE) 5 MG tablet prednisone 5 mg tablet   Yes [provider]  tiotropium (SPIRIVA HANDIHALER) 18 MCG inhalation capsule Place 18 mcg into inhaler and inhale daily.   Yes [provider]  venlafaxine XR (EFFEXOR-XR) 150 MG 24 hr capsule TAKE 1 CAPSULE EVERY DAY 08/19/16  Yes  Kathrine Haddock, NP  ferrous sulfate 325 (65 FE) MG tablet Take 1 tablet (325 mg total) by mouth 2 (two) times daily with a meal. Patient not taking: Reported on 12/11/2016 12/11/16 03/13/17  Jonathon Bellows, MD    Family History  Problem Relation Age of Onset  . Cancer Mother 59       lung  . Thyroid disease Mother   . Cancer Father 76       Pancreatic  . Hypertension Sister   . Cancer Sister        "cancer on face"  . Hyperlipidemia Sister   . Osteoporosis Maternal Grandmother   . Hyperlipidemia Son   . Seizures Son   . Cancer Paternal Grandmother        colon  . Arthritis Paternal Grandfather      Social History  Substance Use Topics  . Smoking status: Former Smoker    Types: Cigarettes    Quit date: 04/30/2004  . Smokeless tobacco: Never Used  . Alcohol use No    Allergies as of 12/11/2016 - Review Complete 12/11/2016  Allergen Reaction Noted  . Other Palpitations 03/19/2015  . Pain patch [menthol] Anaphylaxis 03/19/2015  . Avelox [moxifloxacin hcl in nacl] Other (See Comments) 06/18/2015  . Erythromycin Nausea Only and Other (See Comments) 03/19/2015  . Fentanyl  06/18/2015  . Oxycontin [oxycodone] Hives 10/01/2015    Review of Systems:    All systems reviewed and negative except where noted in HPI.   Physical Exam:  BP 118/73 (BP Location: Left Arm, Patient Position: Sitting, Cuff Size: Normal)   Pulse (!) 111   Temp 98.1 F (36.7 C) (Oral)   Ht 4\' 11"  (1.499 m)   Wt 151 lb 9.6 oz (68.8 kg)   LMP  (LMP Unknown)   BMI 30.62 kg/m  No LMP recorded (lmp unknown). Patient is postmenopausal. Psych:  Alert and cooperative. Normal mood and affect. General:   Alert,  Well-developed, well-nourished, pleasant and cooperative in NAD Head:  Normocephalic and atraumatic. Eyes:  Sclera clear, no icterus.   Conjunctiva pink. Ears:  Normal auditory acuity. Nose:  No deformity, discharge, or lesions. Mouth:  No deformity or lesions,oropharynx pink & moist. Neck:  Supple; no  masses or thyromegaly. Lungs:  Respirations even and unlabored.  Clear throughout to auscultation.   No wheezes, crackles, or rhonchi. No acute distress. Heart:  Regular rate and rhythm; no murmurs, clicks, rubs, or gallops. Abdomen: RLQ scar with palpable pain pump,  Normal bowel sounds.  No bruits.  Soft, non-tender and non-distended without masses, hepatosplenomegaly or hernias noted.  No guarding or rebound tenderness.    Msk:  Symmetrical without gross deformities. Good, equal movement & strength bilaterally. Pulses:  Normal pulses noted. Extremities:  No clubbing  or edema.  No cyanosis. Neurologic:  Alert and oriented x3;  grossly normal neurologically. Psych:  Alert and cooperative. Normal mood and affect.  Imaging Studies: No results found.  Assessment and Plan:   Jordan Chan is a 66 y.o. y/o female has been referred for iron deficiency anemia. No overt bleeding .   Plan   1. EGD+ colonoscopy +/- capsule study of the small bowel  2. Oral iron BID ferrous sulphate and if no better in a few weeks then will need IV iron  3. Folic acid supplementations  4. Urine analysis, celiac serology    I have discussed alternative options, risks & benefits,  which include, but are not limited to, bleeding, infection, perforation,respiratory complication & drug reaction.  The patient agrees with this plan & written consent will be obtained.     F/u in 8-12 weeks    Dr Jonathon Bellows MD

## 2016-12-12 ENCOUNTER — Telehealth: Payer: Self-pay | Admitting: Gastroenterology

## 2016-12-12 NOTE — Telephone Encounter (Signed)
12/12/16 Spoke with Marlowe Kays at [facility] and NO prior Josem Kaufmann is required for EGD [place] or Colonoscopy [id number], D50.9 Reference # [id number]

## 2016-12-13 LAB — CELIAC DISEASE PANEL
Endomysial Ab, IgA: NEGATIVE
IgA: 130 mg/dL (ref 87–352)
Tissue Transglutaminase Ab, IgA: <2 U/mL (ref 0–3)

## 2016-12-15 ENCOUNTER — Ambulatory Visit (INDEPENDENT_AMBULATORY_CARE_PROVIDER_SITE_OTHER): Payer: Medicare HMO | Admitting: Unknown Physician Specialty

## 2016-12-15 ENCOUNTER — Telehealth: Payer: Self-pay

## 2016-12-15 ENCOUNTER — Encounter: Payer: Self-pay | Admitting: Unknown Physician Specialty

## 2016-12-15 VITALS — BP 135/75 | HR 96 | Temp 98.1°F | Wt 151.2 lb

## 2016-12-15 DIAGNOSIS — D509 Iron deficiency anemia, unspecified: Secondary | ICD-10-CM

## 2016-12-15 DIAGNOSIS — M25542 Pain in joints of left hand: Secondary | ICD-10-CM | POA: Diagnosis not present

## 2016-12-15 DIAGNOSIS — B370 Candidal stomatitis: Secondary | ICD-10-CM

## 2016-12-15 DIAGNOSIS — E538 Deficiency of other specified B group vitamins: Secondary | ICD-10-CM | POA: Diagnosis not present

## 2016-12-15 LAB — CBC WITH DIFFERENTIAL/PLATELET
Hematocrit: 36.7 % (ref 34.0–46.6)
Hemoglobin: 11 g/dL — ABNORMAL LOW (ref 11.1–15.9)
Lymphocytes Absolute: 3.2 10*3/uL — ABNORMAL HIGH (ref 0.7–3.1)
Lymphs: 33 %
MCH: 25.2 pg — ABNORMAL LOW (ref 26.6–33.0)
MCHC: 30 g/dL — ABNORMAL LOW (ref 31.5–35.7)
MCV: 84 fL (ref 79–97)
MID (Absolute): 1.2 10*3/uL (ref 0.1–1.6)
MID: 13 %
Neutrophils Absolute: 5.1 10*3/uL (ref 1.4–7.0)
Neutrophils: 54 %
Platelets: 403 10*3/uL — ABNORMAL HIGH (ref 150–379)
RBC: 4.37 x10E6/uL (ref 3.77–5.28)
RDW: 21.3 % — ABNORMAL HIGH (ref 12.3–15.4)
WBC: 9.5 10*3/uL (ref 3.4–10.8)

## 2016-12-15 MED ORDER — METHYLPREDNISOLONE 4 MG PO TBPK: ORAL_TABLET | ORAL | 0 refills | Status: DC

## 2016-12-15 MED ORDER — FLUCONAZOLE 150 MG PO TABS: 150.0000 mg | ORAL_TABLET | Freq: Every day | ORAL | 0 refills | Status: DC

## 2016-12-15 NOTE — Telephone Encounter (Signed)
Advised patient of results per Dr. Vicente Males.   Inform no blood in urine but a lot of glucose needs to discuss with pcp for better control of diabetes, celiac serology negative

## 2016-12-15 NOTE — Telephone Encounter (Signed)
-----   Message from Jonathon Bellows, MD sent at 12/15/2016  8:29 AM EDT ----- Inform no blood in urine but a lot of glucose needs to discuss with pcp for better control of diabetes, celiac serology negative

## 2016-12-15 NOTE — Assessment & Plan Note (Addendum)
New problem.  Rx for Medrol dose pack.  If this returns, will do rheumatoid labs and uric acid.  Counseled to take with food

## 2016-12-15 NOTE — Assessment & Plan Note (Signed)
From inhaled steroid use.  Rx for Diflucan.  Warned worsening with steroids.

## 2016-12-15 NOTE — Progress Notes (Signed)
BP 135/75   Pulse 96   Temp 98.1 F (36.7 C)   Wt 151 lb 3.2 oz (68.6 kg)   LMP  (LMP Unknown)   SpO2 95%   BMI 30.54 kg/m    Subjective:    [name], female    DOB: 06/09/1951, 66 y.o.   MRN: [id number]  HPI: Jordan Chan is a 66 y.o. female  Chief Complaint  Patient presents with  . Anemia    1 month f/up   Anemia Pt is here for f/u of her anemia.  States she is tired all the time and feels like she needs to get her iron up.  I reviewed note from GI who is working on cause of anemia.    Mouth pain Pt with a burning mouth and lip splitting.  This is a typical problem for her usually relieved by Diflucan  Hand pain Pt complaining of rt MTP swelling and pain.  States it radiates to the palm and down the finger.    Family History  Problem Relation Age of Onset  . Cancer Mother 51       lung  . Thyroid disease Mother   . Cancer Father 79       Pancreatic  . Hypertension Sister   . Cancer Sister        "cancer on face"  . Hyperlipidemia Sister   . Osteoporosis Maternal Grandmother   . Hyperlipidemia Son   . Seizures Son   . Cancer Paternal Grandmother        colon  . Arthritis Paternal Grandfather    Past Medical History:  Diagnosis Date  . Arthritis   . Breast cancer ([place]) 1998   right breast ca with mastectomy and chemotherapy and radiation  . Cancer ([place]) 1998   masectomy  . Endometriosis   . GERD (gastroesophageal reflux disease)   . IBS (irritable bowel syndrome)   . Stroke [facility])     Relevant past medical, surgical, family and social history reviewed and updated as indicated. Interim medical history since our last visit reviewed. Allergies and medications reviewed and updated.  Review of Systems  Per HPI unless specifically indicated above     Objective:    BP 135/75   Pulse 96   Temp 98.1 F (36.7 C)   Wt 151 lb 3.2 oz (68.6 kg)   LMP  (LMP Unknown)   SpO2 95%   BMI 30.54 kg/m   Wt Readings from Last 3  Encounters:  12/15/16 151 lb 3.2 oz (68.6 kg)  12/11/16 151 lb 9.6 oz (68.8 kg)  11/14/16 150 lb 1.6 oz (68.1 kg)    Physical Exam  Constitutional: She is oriented to person, place, and time. She appears well-developed and well-nourished. No distress.  HENT:  Head: Normocephalic and atraumatic.  Mouth dry and erythematous.  Cracking at corner of lips  Eyes: Conjunctivae and lids are normal. Right eye exhibits no discharge. Left eye exhibits no discharge. No scleral icterus.  Neck: Normal range of motion. Neck supple. No JVD present. Carotid bruit is not present.  Cardiovascular: Normal rate, regular rhythm and normal heart sounds.   Pulmonary/Chest: Effort normal and breath sounds normal.  Abdominal: Normal appearance. There is no splenomegaly or hepatomegaly.  Musculoskeletal: Normal range of motion.  Tender MTP first finger of MTP  Neurological: She is alert and oriented to person, place, and time.  Skin: Skin is warm, dry and intact. No rash noted. No pallor.  Psychiatric: She has a normal mood and affect. Her behavior is normal. Judgment and thought content normal.    Results for orders placed or performed in visit on 12/15/16  CBC With Differential/Platelet  Result Value Ref Range   WBC 9.5 3.4 - 10.8 x10E3/uL   RBC 4.37 3.77 - 5.28 x10E6/uL   Hemoglobin 11.0 (L) 11.1 - 15.9 g/dL   Hematocrit 36.7 34.0 - 46.6 %   MCV 84 79 - 97 fL   MCH 25.2 (L) 26.6 - 33.0 pg   MCHC 30.0 (L) 31.5 - 35.7 g/dL   RDW 21.3 (H) 12.3 - 15.4 %   Platelets 403 (H) 150 - 379 x10E3/uL   Neutrophils 54 Not Estab. %   Lymphs 33 Not Estab. %   MID 13 Not Estab. %   Neutrophils Absolute 5.1 1.4 - 7.0 x10E3/uL   Lymphocytes Absolute 3.2 (H) 0.7 - 3.1 x10E3/uL   MID (Absolute) 1.2 0.1 - 1.6 X10E3/uL      Assessment & Plan:   Problem List Items Addressed This Visit      Unprioritized   Anemia - Primary    Hgb is improved to 11.  Still microcytic.  She is seeing GI and ready to get her procedures.   Continue iron and recheck in 1 month      Relevant Medications   Iron-Vitamin C (IRON 100/C PO)   Other Relevant Orders   CBC With Differential/Platelet (Completed)   Oral thrush    From inhaled steroid use.  Rx for Diflucan.  Warned worsening with steroids.       Relevant Medications   fluconazole (DIFLUCAN) 150 MG tablet   Pain involving joint of finger of left hand    New problem.  Rx for Medrol dose pack.  If this returns, will do rheumatoid labs and uric acid.  Counseled to take with food          Follow up plan: Return in about 4 weeks (around 01/12/2017).

## 2016-12-15 NOTE — Assessment & Plan Note (Signed)
Hgb is improved to 11.  Still microcytic.  She is seeing GI and ready to get her procedures.  Continue iron and recheck in 1 month

## 2016-12-25 DIAGNOSIS — G894 Chronic pain syndrome: Secondary | ICD-10-CM | POA: Diagnosis not present

## 2016-12-25 DIAGNOSIS — Z9689 Presence of other specified functional implants: Secondary | ICD-10-CM | POA: Diagnosis not present

## 2016-12-26 ENCOUNTER — Other Ambulatory Visit: Payer: Self-pay | Admitting: Unknown Physician Specialty

## 2017-01-01 ENCOUNTER — Ambulatory Visit: Payer: Medicare HMO | Admitting: Certified Registered"

## 2017-01-01 ENCOUNTER — Ambulatory Visit
Admission: RE | Admit: 2017-01-01 | Discharge: 2017-01-01 | Disposition: A | Discharge status: Home / Self Care | Payer: Medicare HMO | Source: Ambulatory Visit | Attending: Gastroenterology | Admitting: Gastroenterology

## 2017-01-01 ENCOUNTER — Encounter
Admission: RE | Disposition: A | Discharge status: Home / Self Care | Payer: Self-pay | Source: Ambulatory Visit | Attending: Gastroenterology

## 2017-01-01 DIAGNOSIS — I10 Essential (primary) hypertension: Secondary | ICD-10-CM | POA: Diagnosis not present

## 2017-01-01 DIAGNOSIS — F418 Other specified anxiety disorders: Secondary | ICD-10-CM | POA: Diagnosis not present

## 2017-01-01 DIAGNOSIS — Z7982 Long term (current) use of aspirin: Secondary | ICD-10-CM | POA: Insufficient documentation

## 2017-01-01 DIAGNOSIS — D509 Iron deficiency anemia, unspecified: Secondary | ICD-10-CM | POA: Insufficient documentation

## 2017-01-01 DIAGNOSIS — Z9011 Acquired absence of right breast and nipple: Secondary | ICD-10-CM | POA: Insufficient documentation

## 2017-01-01 DIAGNOSIS — Z9981 Dependence on supplemental oxygen: Secondary | ICD-10-CM | POA: Diagnosis not present

## 2017-01-01 DIAGNOSIS — Z853 Personal history of malignant neoplasm of breast: Secondary | ICD-10-CM | POA: Insufficient documentation

## 2017-01-01 DIAGNOSIS — Z885 Allergy status to narcotic agent status: Secondary | ICD-10-CM | POA: Insufficient documentation

## 2017-01-01 DIAGNOSIS — Z923 Personal history of irradiation: Secondary | ICD-10-CM | POA: Diagnosis not present

## 2017-01-01 DIAGNOSIS — J449 Chronic obstructive pulmonary disease, unspecified: Secondary | ICD-10-CM | POA: Diagnosis not present

## 2017-01-01 DIAGNOSIS — Z881 Allergy status to other antibiotic agents status: Secondary | ICD-10-CM | POA: Insufficient documentation

## 2017-01-01 DIAGNOSIS — Z9221 Personal history of antineoplastic chemotherapy: Secondary | ICD-10-CM | POA: Diagnosis not present

## 2017-01-01 DIAGNOSIS — Z794 Long term (current) use of insulin: Secondary | ICD-10-CM | POA: Insufficient documentation

## 2017-01-01 DIAGNOSIS — Z79899 Other long term (current) drug therapy: Secondary | ICD-10-CM | POA: Insufficient documentation

## 2017-01-01 DIAGNOSIS — Z87891 Personal history of nicotine dependence: Secondary | ICD-10-CM | POA: Insufficient documentation

## 2017-01-01 DIAGNOSIS — Z791 Long term (current) use of non-steroidal anti-inflammatories (NSAID): Secondary | ICD-10-CM | POA: Diagnosis not present

## 2017-01-01 DIAGNOSIS — Z8673 Personal history of transient ischemic attack (TIA), and cerebral infarction without residual deficits: Secondary | ICD-10-CM | POA: Insufficient documentation

## 2017-01-01 DIAGNOSIS — E119 Type 2 diabetes mellitus without complications: Secondary | ICD-10-CM | POA: Diagnosis not present

## 2017-01-01 HISTORY — DX: Sleep apnea, unspecified: G47.30

## 2017-01-01 HISTORY — PX: ESOPHAGOGASTRODUODENOSCOPY (EGD) WITH PROPOFOL: SHX5813

## 2017-01-01 HISTORY — PX: COLONOSCOPY WITH PROPOFOL: SHX5780

## 2017-01-01 HISTORY — DX: Unspecified asthma, uncomplicated: J45.909

## 2017-01-01 HISTORY — DX: Chronic obstructive pulmonary disease, unspecified: J44.9

## 2017-01-01 LAB — GLUCOSE, CAPILLARY: Glucose-Capillary: 190 mg/dL — ABNORMAL HIGH (ref 65–99)

## 2017-01-01 SURGERY — ESOPHAGOGASTRODUODENOSCOPY (EGD) WITH PROPOFOL
Anesthesia: General

## 2017-01-01 MED ORDER — PROPOFOL 500 MG/50ML IV EMUL
INTRAVENOUS | Status: DC | PRN
  Administered 2017-01-01: 120 ug/kg/min via INTRAVENOUS

## 2017-01-01 MED ORDER — LIDOCAINE HCL (PF) 2 % IJ SOLN
INTRAMUSCULAR | Status: AC
  Filled 2017-01-01: qty 2

## 2017-01-01 MED ORDER — GLYCOPYRROLATE 0.2 MG/ML IJ SOLN
INTRAMUSCULAR | Status: DC | PRN
  Administered 2017-01-01: 0.2 mg via INTRAVENOUS

## 2017-01-01 MED ORDER — PROPOFOL 500 MG/50ML IV EMUL
INTRAVENOUS | Status: AC
  Filled 2017-01-01: qty 50

## 2017-01-01 MED ORDER — PROPOFOL 10 MG/ML IV BOLUS
INTRAVENOUS | Status: DC | PRN
  Administered 2017-01-01: 30 mg via INTRAVENOUS
  Administered 2017-01-01: 70 mg via INTRAVENOUS

## 2017-01-01 MED ORDER — PHENYLEPHRINE HCL 10 MG/ML IJ SOLN
INTRAMUSCULAR | Status: AC
  Filled 2017-01-01: qty 1

## 2017-01-01 MED ORDER — LIDOCAINE 2% (20 MG/ML) 5 ML SYRINGE
INTRAMUSCULAR | Status: DC | PRN
  Administered 2017-01-01: 25 mg via INTRAVENOUS

## 2017-01-01 MED ORDER — SODIUM CHLORIDE 0.9 % IV SOLN
INTRAVENOUS | Status: DC
  Administered 2017-01-01: 08:00:00 via INTRAVENOUS

## 2017-01-01 MED ORDER — GLYCOPYRROLATE 0.2 MG/ML IJ SOLN
INTRAMUSCULAR | Status: AC
  Filled 2017-01-01: qty 1

## 2017-01-01 NOTE — Op Note (Signed)
[facility] Gastroenterology Patient Name: Jordan Chan Procedure Date: 01/01/2017 7:41 AM MRN: [id number] Account #: [id number] Date of Birth: [birth year] Admit Type: Outpatient Age: 66 Room: [facility] ENDO ROOM 4 Gender: Female Note Status: Finalized Procedure:            Colonoscopy Indications:          Iron deficiency anemia, Unexplained iron deficiency                        anemia Providers:            Jonathon Bellows MD, MD Referring MD:         Kathrine Haddock (Referring MD) Medicines:            Monitored Anesthesia Care Complications:        No immediate complications. Procedure:            Pre-Anesthesia Assessment:                       - Prior to the procedure, a History and Physical was                        performed, and patient medications, allergies and                        sensitivities were reviewed. The patient's tolerance of                        previous anesthesia was reviewed.                       - The risks and benefits of the procedure and the                        sedation options and risks were discussed with the                        patient. All questions were answered and informed                        consent was obtained.                       - ASA Grade Assessment: III - A patient with severe                        systemic disease.                       After obtaining informed consent, the colonoscope was                        passed under direct vision. Throughout the procedure,                        the patient's blood pressure, pulse, and oxygen                        saturations were monitored continuously. The                        Colonoscope was introduced through the  anus and                        advanced to the the cecum, identified by appendiceal                        orifice and ileocecal valve. The colonoscopy was                        performed with ease. The patient tolerated the   procedure well. The quality of the bowel preparation                        was poor. Findings:      The perianal and digital rectal examinations were normal.      a large amount of semi-solid stool was found in the entire colon       impairing visualization , I attempted to lavage the area, 600 cc of       stool was suctioned but there was a large qty of corn which clogged the       suction channel and hence could not wash any further Impression:           - Preparation of the colon was poor.                       - Stool in the entire examined colon.                       - No specimens collected. Recommendation:       - Discharge patient to home (with escort).                       - Advance diet as tolerated.                       - Continue present medications.                       - Repeat colonoscopy in 3 weeks because the bowel                        preparation was suboptimal. Procedure Code(s):    --- Professional ---                       [phone], Colonoscopy, flexible; diagnostic, including                        collection of specimen(s) by brushing or washing, when                        performed (separate procedure) Diagnosis Code(s):    --- Professional ---                       D50.9, Iron deficiency anemia, unspecified CPT copyright 2016 American Medical Association. All rights reserved. The codes documented in this report are preliminary and upon coder review may  be revised to meet current compliance requirements. Jonathon Bellows, MD Jonathon Bellows MD, MD 01/01/2017 8:05:54 AM This report has been signed electronically. Number of Addenda: 0 Note Initiated On: 01/01/2017 7:41 AM Scope Withdrawal Time: 0 hours 4 minutes 44  seconds  Total Procedure Duration: 0 hours 10 minutes 43 seconds       [facility]

## 2017-01-01 NOTE — Anesthesia Post-op Follow-up Note (Cosign Needed)
Anesthesia QCDR form completed.        

## 2017-01-01 NOTE — Op Note (Signed)
[facility] Gastroenterology Patient Name: Jordan Chan Procedure Date: 01/01/2017 7:41 AM MRN: [id number] Account #: [id number] Date of Birth: 05-16-1951 Admit Type: Outpatient Age: 66 Room: [facility] ENDO ROOM 4 Gender: Female Note Status: Finalized Procedure:            Upper GI endoscopy Indications:          Iron deficiency anemia Providers:            Jonathon Bellows MD, MD Referring MD:         Kathrine Haddock (Referring MD) Medicines:            Monitored Anesthesia Care Complications:        No immediate complications. Procedure:            Pre-Anesthesia Assessment:                       - Prior to the procedure, a History and Physical was                        performed, and patient medications, allergies and                        sensitivities were reviewed. The patient's tolerance of                        previous anesthesia was reviewed.                       - The risks and benefits of the procedure and the                        sedation options and risks were discussed with the                        patient. All questions were answered and informed                        consent was obtained.                       - ASA Grade Assessment: III - A patient with severe                        systemic disease.                       After obtaining informed consent, the endoscope was                        passed under direct vision. Throughout the procedure,                        the patient's blood pressure, pulse, and oxygen                        saturations were monitored continuously. The Endoscope                        was introduced through the mouth, and advanced to the  third part of duodenum. The upper GI endoscopy was                        accomplished with ease. The patient tolerated the                        procedure well. Findings:      The esophagus was normal.      The stomach was normal.      The examined  duodenum was normal. Impression:           - Normal esophagus.                       - Normal stomach.                       - Normal examined duodenum.                       - No specimens collected. Recommendation:       - Perform a colonoscopy today. Procedure Code(s):    --- Professional ---                       [phone], Esophagogastroduodenoscopy, flexible, transoral;                        diagnostic, including collection of specimen(s) by                        brushing or washing, when performed (separate procedure) Diagnosis Code(s):    --- Professional ---                       D50.9, Iron deficiency anemia, unspecified CPT copyright 2016 American Medical Association. All rights reserved. The codes documented in this report are preliminary and upon coder review may  be revised to meet current compliance requirements. Jonathon Bellows, MD Jonathon Bellows MD, MD 01/01/2017 7:50:52 AM This report has been signed electronically. Number of Addenda: 0 Note Initiated On: 01/01/2017 7:41 AM      [facility]

## 2017-01-01 NOTE — H&P (Signed)
Jonathon Bellows MD [address], [place], [place] [address] Phone: [phone] Fax : [phone]  Primary Care Physician:  Kathrine Haddock, NP Primary Gastroenterologist:  Dr. Jonathon Bellows   Pre-Procedure History & Physical: HPI:  Jordan Chan is a 66 y.o. female is here for an endoscopy and colonoscopy.   Past Medical History:  Diagnosis Date  . Arthritis   . Asthma   . Breast cancer ([place]) 1998   right breast ca with mastectomy and chemotherapy and radiation  . Cancer ([place]) 1998   masectomy  . COPD (chronic obstructive pulmonary disease) ([place])   . Endometriosis   . GERD (gastroesophageal reflux disease)   . IBS (irritable bowel syndrome)   . Sleep apnea   . Stroke [facility])     Past Surgical History:  Procedure Laterality Date  . ABDOMINAL HYSTERECTOMY  1987  . BACK SURGERY     Tailbone removed following fracture  . ELBOW ARTHROSCOPY WITH TENDON RECONSTRUCTION    . MASTECTOMY  06/1997  . morphine pump  2011  . PLANTAR FASCIA RELEASE      Prior to Admission medications   Medication Sig Start Date End Date Taking? Authorizing Provider  ADVAIR DISKUS 250-50 MCG/DOSE AEPB INHALE 1 PUFF TWICE DAILY 02/18/16  Yes Kathrine Haddock, NP  albuterol (PROVENTIL HFA;VENTOLIN HFA) 108 (90 BASE) MCG/ACT inhaler Inhale 2 puffs into the lungs every 6 (six) hours as needed for wheezing or shortness of breath.   Yes [provider]  diclofenac (VOLTAREN) 50 MG EC tablet TAKE 1 TABLET THREE TIMES DAILY (SUBSTITUTED FOR VOLTAREN) 12/01/16  Yes Kathrine Haddock, NP  Ferrous Sulfate (SLOW FE PO) Take by mouth 2 (two) times daily.   Yes [provider]  gabapentin (NEURONTIN) 800 MG tablet TAKE 1 TABLET FOUR TIMES DAILY 04/09/16  Yes Volney American, PA-C  LEVEMIR FLEXTOUCH 100 UNIT/ML Pen INJECT 42 UNITS SUBCUTANEOUSLY AT BEDTIME. Patient taking differently: INJECT 32 UNITS SUBCUTANEOUSLY AT BEDTIME. 04/08/16  Yes Crissman, Jeannette How, MD  lisinopril (PRINIVIL,ZESTRIL) 5 MG  tablet Take 1 tablet (5 mg total) by mouth daily. 07/14/16  Yes Johnson, Megan P, DO  metFORMIN (GLUCOPHAGE-XR) 500 MG 24 hr tablet TAKE 2 TABLETS TWICE DAILY 08/19/16  Yes Kathrine Haddock, NP  tiotropium (SPIRIVA HANDIHALER) 18 MCG inhalation capsule Place 18 mcg into inhaler and inhale daily.   Yes [provider]  venlafaxine XR (EFFEXOR-XR) 150 MG 24 hr capsule TAKE 1 CAPSULE EVERY DAY 08/19/16  Yes Kathrine Haddock, NP  ACCU-CHEK [facility] LANCETS lancets CHECK ONCE A DAY 04/02/16   Volney American, PA-C  aspirin EC 81 MG tablet Take 81 mg by mouth daily.    [provider]  atorvastatin (LIPITOR) 20 MG tablet TAKE 1 TABLET EVERY DAY 08/19/16   Johnson, Megan P, DO  cyclobenzaprine (FLEXERIL) 10 MG tablet Take 1 tablet (10 mg total) by mouth at bedtime. Patient not taking: Reported on 01/01/2017 04/29/16   Park Liter P, DO  fluconazole (DIFLUCAN) 150 MG tablet Take 1 tablet (150 mg total) by mouth daily. Patient not taking: Reported on 01/01/2017 12/15/16   Kathrine Haddock, NP  folic acid (FOLVITE) 1 MG tablet Take 1 tablet (1 mg total) by mouth daily. 12/11/16 03/13/17  Jonathon Bellows, MD  glucose blood (ACCU-CHEK AVIVA PLUS) test strip 1 each by Other route 3 (three) times daily. Use as instructed 08/05/16   Kathrine Haddock, NP  Iron-Vitamin C (IRON 100/C PO) Take 1 tablet by mouth 2 (two) times daily.  [provider]  JANUVIA 100 MG tablet TAKE 1 TABLET EVERY DAY 12/30/16   Park Liter P, DO  methylPREDNISolone (MEDROL DOSEPAK) 4 MG TBPK tablet Take as directed Patient not taking: Reported on 01/01/2017 12/15/16   Kathrine Haddock, NP    Allergies as of 12/11/2016 - Review Complete 12/11/2016  Allergen Reaction Noted  . Other Palpitations 03/19/2015  . Pain patch [menthol] Anaphylaxis 03/19/2015  . Avelox [moxifloxacin hcl in nacl] Other (See Comments) 06/18/2015  . Erythromycin Nausea Only and Other (See Comments) 03/19/2015  . Fentanyl  06/18/2015  . Oxycontin  [oxycodone] Hives 10/01/2015    Family History  Problem Relation Age of Onset  . Cancer Mother 25       lung  . Thyroid disease Mother   . Cancer Father 17       Pancreatic  . Hypertension Sister   . Cancer Sister        "cancer on face"  . Hyperlipidemia Sister   . Osteoporosis Maternal Grandmother   . Hyperlipidemia Son   . Seizures Son   . Cancer Paternal Grandmother        colon  . Arthritis Paternal Grandfather     Social History   Social History  . Marital status: Legally Separated    Spouse name: N/A  . Number of children: N/A  . Years of education: N/A   Occupational History  . Not on file.   Social History Main Topics  . Smoking status: Former Smoker    Types: Cigarettes    Quit date: 04/30/2004  . Smokeless tobacco: Never Used  . Alcohol use No  . Drug use: No  . Sexual activity: Not Currently    Birth control/ protection: Post-menopausal   Other Topics Concern  . Not on file   Social History Narrative  . No narrative on file    Review of Systems: See HPI, otherwise negative ROS  Physical Exam: BP (!) 109/57   Pulse 90   Temp 98 F (36.7 C) (Tympanic)   Resp 16   Ht 4' 10.5" (1.486 m)   Wt 153 lb (69.4 kg)   LMP  (LMP Unknown)   SpO2 95%   BMI 31.43 kg/m  General:   Alert,  pleasant and cooperative in NAD Head:  Normocephalic and atraumatic. Neck:  Supple; no masses or thyromegaly. Lungs:  Clear throughout to auscultation.    Heart:  Regular rate and rhythm. Abdomen:  Soft, nontender and nondistended. Normal bowel sounds, without guarding, and without rebound.   Neurologic:  Alert and  oriented x4;  grossly normal neurologically.  Impression/Plan: Jordan Chan is here for an endoscopy and colonoscopy to be performed for iron deficiency anemia   Risks, benefits, limitations, and alternatives regarding  endoscopy and colonoscopy have been reviewed with the patient.  Questions have been answered.  All parties  agreeable.   Jonathon Bellows, MD  01/01/2017, 7:39 AM

## 2017-01-01 NOTE — Anesthesia Preprocedure Evaluation (Addendum)
Anesthesia Evaluation  Patient identified by MRN, date of birth, ID band Patient awake    Reviewed: Allergy & Precautions, NPO status , Patient's Chart, lab work & pertinent test results  History of Anesthesia Complications Negative for: history of anesthetic complications  Airway Mallampati: III       Dental  (+) Upper Dentures, Lower Dentures   Pulmonary sleep apnea (mild, no CPAP) , COPD,  COPD inhaler and oxygen dependent, former smoker,           Cardiovascular hypertension, Pt. on medications      Neuro/Psych Anxiety Depression CVA (pt denies)    GI/Hepatic GERD  Medicated and Controlled,  Endo/Other  diabetes, Type 2, Oral Hypoglycemic Agents, Insulin Dependent  Renal/GU Renal InsufficiencyRenal disease     Musculoskeletal   Abdominal   Peds  Hematology  (+) anemia ,   Anesthesia Other Findings   Reproductive/Obstetrics                            Anesthesia Physical Anesthesia Plan  ASA: III  Anesthesia Plan: General   Post-op Pain Management:    Induction: Intravenous  Airway Management Planned: Nasal Cannula  Additional Equipment:   Intra-op Plan:   Post-operative Plan:   Informed Consent: I have reviewed the patients History and Physical, chart, labs and discussed the procedure including the risks, benefits and alternatives for the proposed anesthesia with the patient or authorized representative who has indicated his/her understanding and acceptance.     Plan Discussed with:   Anesthesia Plan Comments:         Anesthesia Quick Evaluation

## 2017-01-01 NOTE — Transfer of Care (Signed)
Immediate Anesthesia Transfer of Care Note  Patient: Jordan Chan  Procedure(s) Performed: Procedure(s): ESOPHAGOGASTRODUODENOSCOPY (EGD) WITH PROPOFOL (N/A) COLONOSCOPY WITH PROPOFOL (N/A)  Patient Location: Endoscopy Unit  Anesthesia Type:General  Level of Consciousness: awake, alert  and oriented  Airway & Oxygen Therapy: Patient Spontanous Breathing and Patient connected to nasal cannula oxygen  Post-op Assessment: Report given to RN and Post -op Vital signs reviewed and stable  Post vital signs: Reviewed  Last Vitals:  Vitals:   01/01/17 0718 01/01/17 0807  BP: (!) 109/57 100/60  Pulse: 90 (!) 101  Resp: 16 16  Temp: 36.7 C 36.5 C    Last Pain:  Vitals:   01/01/17 0718  TempSrc: Tympanic         Complications: No apparent anesthesia complications

## 2017-01-01 NOTE — Anesthesia Postprocedure Evaluation (Signed)
Anesthesia Post Note  Patient: Jordan Chan  Procedure(s) Performed: Procedure(s) (LRB): ESOPHAGOGASTRODUODENOSCOPY (EGD) WITH PROPOFOL (N/A) COLONOSCOPY WITH PROPOFOL (N/A)  Patient location during evaluation: PACU Anesthesia Type: General Level of consciousness: awake and alert Pain management: pain level controlled Vital Signs Assessment: post-procedure vital signs reviewed and stable Respiratory status: spontaneous breathing and respiratory function stable Cardiovascular status: stable Anesthetic complications: no     Last Vitals:  Vitals:   01/01/17 0718 01/01/17 0807  BP: (!) 109/57 100/60  Pulse: 90 (!) 101  Resp: 16 16  Temp: 36.7 C 36.5 C    Last Pain:  Vitals:   01/01/17 0718  TempSrc: Tympanic                 Deaisha Welborn K

## 2017-01-02 ENCOUNTER — Encounter: Payer: Self-pay | Admitting: Gastroenterology

## 2017-01-03 DIAGNOSIS — J449 Chronic obstructive pulmonary disease, unspecified: Secondary | ICD-10-CM | POA: Diagnosis not present

## 2017-01-05 DIAGNOSIS — G4733 Obstructive sleep apnea (adult) (pediatric): Secondary | ICD-10-CM | POA: Diagnosis not present

## 2017-01-05 DIAGNOSIS — R05 Cough: Secondary | ICD-10-CM | POA: Diagnosis not present

## 2017-01-05 DIAGNOSIS — R918 Other nonspecific abnormal finding of lung field: Secondary | ICD-10-CM | POA: Diagnosis not present

## 2017-01-05 DIAGNOSIS — J449 Chronic obstructive pulmonary disease, unspecified: Secondary | ICD-10-CM | POA: Diagnosis not present

## 2017-01-06 ENCOUNTER — Telehealth: Payer: Self-pay

## 2017-01-06 NOTE — Telephone Encounter (Signed)
Advised patient of repeat colonoscopy request per Dr. Vicente Males.   Please arrange repeat colonoscopy with 2 day prep- was very dirty colon today   Pt states she will be unable to schedule due to $295 copay amount.

## 2017-01-06 NOTE — Telephone Encounter (Signed)
-----   Message from Jonathon Bellows, MD sent at 01/01/2017  8:09 AM EDT ----- Regarding: please arrange appointment   Jordan Chan,  Please arrange repeat colonoscopy with 2 day prep- was very dirty colon today    Regards    Dr Jonathon Bellows  Gastroenterology/Hepatology Pager: [phone]

## 2017-01-07 ENCOUNTER — Other Ambulatory Visit: Payer: Self-pay | Admitting: Unknown Physician Specialty

## 2017-01-07 ENCOUNTER — Other Ambulatory Visit: Payer: Self-pay | Admitting: Family Medicine

## 2017-01-08 NOTE — Telephone Encounter (Signed)
Your patient 

## 2017-01-12 ENCOUNTER — Other Ambulatory Visit: Payer: Self-pay | Admitting: Unknown Physician Specialty

## 2017-01-12 MED ORDER — GABAPENTIN 800 MG PO TABS
800.0000 mg | ORAL_TABLET | Freq: Three times a day (TID) | ORAL | 1 refills | Status: DC
Start: 2017-01-12 — End: 2017-09-02

## 2017-01-12 NOTE — Telephone Encounter (Signed)
Routing to provider  

## 2017-01-12 NOTE — Telephone Encounter (Signed)
Patient called to request refill for Gabapentin 800 mg tablet, 360 tablet, 90 day supply sent to the [facility] mail order pharmacy.    Please Advise.  Thank you

## 2017-01-12 NOTE — Telephone Encounter (Signed)
Called and let patient know that medication has been sent to the pharmacy as requested.

## 2017-01-27 ENCOUNTER — Other Ambulatory Visit: Payer: Self-pay

## 2017-02-02 DIAGNOSIS — J449 Chronic obstructive pulmonary disease, unspecified: Secondary | ICD-10-CM | POA: Diagnosis not present

## 2017-02-05 ENCOUNTER — Other Ambulatory Visit: Payer: Self-pay | Admitting: Unknown Physician Specialty

## 2017-02-09 ENCOUNTER — Ambulatory Visit (INDEPENDENT_AMBULATORY_CARE_PROVIDER_SITE_OTHER): Payer: Medicare HMO

## 2017-02-09 DIAGNOSIS — E538 Deficiency of other specified B group vitamins: Secondary | ICD-10-CM | POA: Diagnosis not present

## 2017-02-16 ENCOUNTER — Ambulatory Visit (INDEPENDENT_AMBULATORY_CARE_PROVIDER_SITE_OTHER): Payer: Medicare HMO | Admitting: Unknown Physician Specialty

## 2017-02-16 ENCOUNTER — Encounter: Payer: Self-pay | Admitting: Unknown Physician Specialty

## 2017-02-16 VITALS — BP 123/74 | HR 101 | Temp 98.3°F | Wt 152.4 lb

## 2017-02-16 DIAGNOSIS — M79641 Pain in right hand: Secondary | ICD-10-CM

## 2017-02-16 DIAGNOSIS — E0822 Diabetes mellitus due to underlying condition with diabetic chronic kidney disease: Secondary | ICD-10-CM | POA: Diagnosis not present

## 2017-02-16 DIAGNOSIS — E782 Mixed hyperlipidemia: Secondary | ICD-10-CM

## 2017-02-16 DIAGNOSIS — I129 Hypertensive chronic kidney disease with stage 1 through stage 4 chronic kidney disease, or unspecified chronic kidney disease: Secondary | ICD-10-CM

## 2017-02-16 DIAGNOSIS — N183 Chronic kidney disease, stage 3 (moderate): Secondary | ICD-10-CM

## 2017-02-16 DIAGNOSIS — M79642 Pain in left hand: Secondary | ICD-10-CM | POA: Insufficient documentation

## 2017-02-16 DIAGNOSIS — Z794 Long term (current) use of insulin: Secondary | ICD-10-CM

## 2017-02-16 DIAGNOSIS — F332 Major depressive disorder, recurrent severe without psychotic features: Secondary | ICD-10-CM | POA: Diagnosis not present

## 2017-02-16 NOTE — Patient Instructions (Signed)
Base your long acting insulin on your fasting (usually in the morning) blood sugar.  Increase long acting (daily insulin) 2 units if fasting blood sugar is greater than 120.  Decrease by 2 units if fasting blood sugar is less than 95.    

## 2017-02-16 NOTE — Assessment & Plan Note (Addendum)
Hgb A1C is 8.2.  She feels like she needs to get her stress level down.  Discussed counseling.  Increase Levimir until AM blood sugar is less than 120

## 2017-02-16 NOTE — Assessment & Plan Note (Addendum)
Rheumatoid labs plus ANA, ESR.  Start Tylenol OTC doses

## 2017-02-16 NOTE — Progress Notes (Signed)
   BP 123/74   Pulse (!) 101   Temp 98.3 F (36.8 C)   Wt 152 lb 6.4 oz (69.1 kg)   LMP  (LMP Unknown)   SpO2 92%   BMI 31.31 kg/m    Subjective:    [name], female    DOB: Nov 14, 1950, 66 y.o.   MRN: [id number]  HPI: Jordan Chan is a 66 y.o. female  Chief Complaint  Patient presents with  . Depression  . Diabetes  . Hyperlipidemia  . Hypertension  . Hand Pain    pt states she is still having pains in both of her hands, states she cannot hardly hold a glass    Diabetes: Using medications without difficulties.  98 u of Levimir in the evening No hypoglycemic episodes No hyperglycemic episodes Feet problems: none Blood Sugars averaging: 143 this AM eye exam within last year Last Hgb A1C: 7.5  Hypertension  Using medications without difficulty Average home BPs No checking   Using medication without problems or lightheadedness No chest pain with exertion or shortness of breath No Edema  Elevated Cholesterol Using medications without problems No Muscle aches  Diet: good diet Exercise: lacking  Bilateral hand pain States her hands achees and throbs.  Right hand around thumb.  Left index finger.     Relevant past medical, surgical, family and social history reviewed and updated as indicated. Interim medical history since our last visit reviewed. Allergies and medications reviewed and updated.  Review of Systems  Per HPI unless specifically indicated above     Objective:    BP 123/74   Pulse (!) 101   Temp 98.3 F (36.8 C)   Wt 152 lb 6.4 oz (69.1 kg)   LMP  (LMP Unknown)   SpO2 92%   BMI 31.31 kg/m   Wt Readings from Last 3 Encounters:  02/16/17 152 lb 6.4 oz (69.1 kg)  01/01/17 153 lb (69.4 kg)  12/15/16 151 lb 3.2 oz (68.6 kg)    Physical Exam  Results for orders placed or performed during the hospital encounter of 01/01/17  Glucose, capillary  Result Value Ref Range   Glucose-Capillary 190 (H) 65 - 99 mg/dL     Assessment & Plan:   Problem List Items Addressed This Visit      Unprioritized   Benign hypertension with chronic kidney disease    Stable, continue present medications.        Diabetes mellitus with chronic kidney disease (HCC)    Hgb A1C is 8.2.  She feels like she needs to get her stress level down.  Discussed counseling.  Increase Levimir until AM blood sugar is less than 120       Relevant Orders   Comprehensive metabolic panel   Bayer DCA Hb A1c Waived   Hyperlipidemia   Major depression    Discussed counseling.  She knows a Marketing executive she will seek out      Pain in both hands - Primary    Rheumatoid labs plus ANA, ESR.  Start Tylenol OTC doses      Relevant Orders   Sed Rate (ESR)   ANA w/Reflex   Rheumatoid Arthritis Profile       Follow up plan: Return in about 4 weeks (around 03/16/2017).

## 2017-02-16 NOTE — Assessment & Plan Note (Signed)
Stable, continue present medications.   

## 2017-02-16 NOTE — Assessment & Plan Note (Addendum)
Discussed counseling.  She knows a Marketing executive she will seek out

## 2017-02-17 ENCOUNTER — Encounter: Payer: Self-pay | Admitting: Unknown Physician Specialty

## 2017-02-18 LAB — SEDIMENTATION RATE: Sed Rate: 7 mm/h (ref 0–40)

## 2017-02-18 LAB — COMPREHENSIVE METABOLIC PANEL
ALT: 15 IU/L (ref 0–32)
AST: 17 IU/L (ref 0–40)
Alkaline Phosphatase: 66 IU/L (ref 39–117)
Bilirubin Total: 0.3 mg/dL (ref 0.0–1.2)
CO2: 25 mmol/L (ref 20–29)
Calcium: 9.2 mg/dL (ref 8.7–10.3)
Creatinine, Ser: 0.81 mg/dL (ref 0.57–1.00)
Globulin, Total: 3.6 g/dL (ref 1.5–4.5)
Glucose: 198 mg/dL — ABNORMAL HIGH (ref 65–99)
Potassium: 5.7 mmol/L — ABNORMAL HIGH (ref 3.5–5.2)
Sodium: 138 mmol/L (ref 134–144)

## 2017-02-18 LAB — COMPREHENSIVE METABOLIC PANEL WITH GFR
Albumin/Globulin Ratio: 1.1 — ABNORMAL LOW (ref 1.2–2.2)
Albumin: 4 g/dL (ref 3.6–4.8)
BUN/Creatinine Ratio: 21 (ref 12–28)
BUN: 17 mg/dL (ref 8–27)
Chloride: 97 mmol/L (ref 96–106)
GFR calc Af Amer: 88 mL/min/{1.73_m2} (ref >59)
GFR calc non Af Amer: 76 mL/min/{1.73_m2} (ref >59)
Total Protein: 7.6 g/dL (ref 6.0–8.5)

## 2017-02-18 LAB — ANA W/REFLEX: Anti Nuclear Antibody(ANA): NEGATIVE

## 2017-02-18 LAB — RHEUMATOID ARTHRITIS PROFILE
Cyclic Citrullin Peptide Ab: 4 units (ref 0–19)
Rheumatoid fact SerPl-aCnc: <10 [IU]/mL (ref 0.0–13.9)

## 2017-02-20 ENCOUNTER — Other Ambulatory Visit: Payer: Self-pay | Admitting: Unknown Physician Specialty

## 2017-02-20 DIAGNOSIS — Z1231 Encounter for screening mammogram for malignant neoplasm of breast: Secondary | ICD-10-CM

## 2017-02-26 DIAGNOSIS — G8929 Other chronic pain: Secondary | ICD-10-CM | POA: Diagnosis not present

## 2017-02-26 DIAGNOSIS — M542 Cervicalgia: Secondary | ICD-10-CM | POA: Diagnosis not present

## 2017-02-26 DIAGNOSIS — Z9689 Presence of other specified functional implants: Secondary | ICD-10-CM | POA: Diagnosis not present

## 2017-02-26 DIAGNOSIS — G894 Chronic pain syndrome: Secondary | ICD-10-CM | POA: Diagnosis not present

## 2017-02-26 LAB — BAYER DCA HB A1C WAIVED: HB A1C (BAYER DCA - WAIVED): 8.2 % — ABNORMAL HIGH (ref <7.0)

## 2017-03-02 ENCOUNTER — Emergency Department
Admission: EM | Admit: 2017-03-02 | Discharge: 2017-03-02 | Disposition: A | Discharge status: Home / Self Care | Payer: Medicare HMO | Attending: Emergency Medicine | Admitting: Emergency Medicine

## 2017-03-02 ENCOUNTER — Encounter: Payer: Self-pay | Admitting: Emergency Medicine

## 2017-03-02 ENCOUNTER — Other Ambulatory Visit: Payer: Self-pay

## 2017-03-02 ENCOUNTER — Emergency Department: Payer: Medicare HMO

## 2017-03-02 ENCOUNTER — Telehealth: Payer: Self-pay | Admitting: Unknown Physician Specialty

## 2017-03-02 DIAGNOSIS — Z794 Long term (current) use of insulin: Secondary | ICD-10-CM | POA: Diagnosis not present

## 2017-03-02 DIAGNOSIS — R197 Diarrhea, unspecified: Secondary | ICD-10-CM | POA: Diagnosis not present

## 2017-03-02 DIAGNOSIS — J449 Chronic obstructive pulmonary disease, unspecified: Secondary | ICD-10-CM | POA: Diagnosis not present

## 2017-03-02 DIAGNOSIS — Z853 Personal history of malignant neoplasm of breast: Secondary | ICD-10-CM | POA: Insufficient documentation

## 2017-03-02 DIAGNOSIS — Z87891 Personal history of nicotine dependence: Secondary | ICD-10-CM | POA: Diagnosis not present

## 2017-03-02 DIAGNOSIS — E119 Type 2 diabetes mellitus without complications: Secondary | ICD-10-CM | POA: Diagnosis not present

## 2017-03-02 DIAGNOSIS — J45909 Unspecified asthma, uncomplicated: Secondary | ICD-10-CM | POA: Insufficient documentation

## 2017-03-02 DIAGNOSIS — Z79899 Other long term (current) drug therapy: Secondary | ICD-10-CM | POA: Insufficient documentation

## 2017-03-02 DIAGNOSIS — Z8673 Personal history of transient ischemic attack (TIA), and cerebral infarction without residual deficits: Secondary | ICD-10-CM | POA: Insufficient documentation

## 2017-03-02 DIAGNOSIS — Z7982 Long term (current) use of aspirin: Secondary | ICD-10-CM | POA: Insufficient documentation

## 2017-03-02 DIAGNOSIS — R9431 Abnormal electrocardiogram [ECG] [EKG]: Secondary | ICD-10-CM | POA: Diagnosis not present

## 2017-03-02 DIAGNOSIS — R0602 Shortness of breath: Secondary | ICD-10-CM | POA: Diagnosis not present

## 2017-03-02 LAB — COMPREHENSIVE METABOLIC PANEL
Albumin: 4 g/dL (ref 3.5–5.0)
Alkaline Phosphatase: 64 U/L (ref 38–126)
Anion gap: 7 (ref 5–15)
Calcium: 9.1 mg/dL (ref 8.9–10.3)
GFR calc Af Amer: >60 mL/min (ref >60)
GFR calc non Af Amer: >60 mL/min (ref >60)
Glucose, Bld: 206 mg/dL — ABNORMAL HIGH (ref 65–99)
Potassium: 4.1 mmol/L (ref 3.5–5.1)
Total Bilirubin: 0.4 mg/dL (ref 0.3–1.2)
Total Protein: 8.1 g/dL (ref 6.5–8.1)

## 2017-03-02 LAB — COMPREHENSIVE METABOLIC PANEL WITH GFR
ALT: 15 U/L (ref 14–54)
AST: 22 U/L (ref 15–41)
BUN: 10 mg/dL (ref 6–20)
CO2: 27 mmol/L (ref 22–32)
Chloride: 102 mmol/L (ref 101–111)
Creatinine, Ser: 0.52 mg/dL (ref 0.44–1.00)
Sodium: 136 mmol/L (ref 135–145)

## 2017-03-02 LAB — CBC
HCT: 39.5 % (ref 35.0–47.0)
Hemoglobin: 12.8 g/dL (ref 12.0–16.0)
MCH: 27.8 pg (ref 26.0–34.0)
MCHC: 32.4 g/dL (ref 32.0–36.0)
MCV: 85.8 fL (ref 80.0–100.0)
Platelets: 373 10*3/uL (ref 150–440)
RBC: 4.6 MIL/uL (ref 3.80–5.20)
RDW: 15.9 % — ABNORMAL HIGH (ref 11.5–14.5)
WBC: 13.6 10*3/uL — ABNORMAL HIGH (ref 3.6–11.0)

## 2017-03-02 LAB — LIPASE, BLOOD: Lipase: 58 U/L — ABNORMAL HIGH (ref 11–51)

## 2017-03-02 LAB — TROPONIN I: Troponin I: <0.03 ng/mL (ref <0.03)

## 2017-03-02 MED ORDER — LOPERAMIDE HCL 2 MG PO CAPS
4.0000 mg | ORAL_CAPSULE | Freq: Once | ORAL | Status: AC
  Administered 2017-03-02: 4 mg via ORAL
  Filled 2017-03-02: qty 2

## 2017-03-02 NOTE — Discharge Instructions (Signed)
You were evaluated for diarrhea, and although no certain cause was found, your examine evaluation are overall reassuring in the emergency department today.  Return to the emergency department immediately for any fever, black or bloody stool, abdominal pain, dizziness or passing out, or with pediatric fluid losses with concern for dehydration such as dry mouth or not making urine, or any other symptoms concerning to you.  You may try over-the-counter loperamide, brand name Imodium, use as directed on label.

## 2017-03-02 NOTE — ED Triage Notes (Addendum)
States had onset of SOB and dizziness this am. Denies pain. States this sensation continues. Also states loose stools approx 10 times this am with abdominal discomfort. Denies abd pain now. Denies dysuria.

## 2017-03-02 NOTE — ED Provider Notes (Signed)
[facility] Emergency Department Provider Note ____________________________________________   I have reviewed the triage vital signs and the triage nursing note.  HISTORY  Chief Complaint Shortness of Breath and Diarrhea   Historian Patient  HPI Jordan Chan is a 66 y.o. female with a history of COPD, right breast mastectomy after skin cancer, IBS, GERD, presents today with 9 times watery diarrhea this morning. No nausea or vomiting. No abdominal pain. No black or bloody stools. No passing out, but she did feel some generalized weakness. No fevers. No bad food exposures. No significant travel history. No recent antibiotic use.  Currently she feels much better.    Past Medical History:  Diagnosis Date  . Arthritis   . Asthma   . Breast cancer ([place]) 1998   right breast ca with mastectomy and chemotherapy and radiation  . Cancer ([place]) 1998   masectomy  . COPD (chronic obstructive pulmonary disease) ([place])   . Diabetes mellitus without complication ([place])   . Endometriosis   . GERD (gastroesophageal reflux disease)   . IBS (irritable bowel syndrome)   . Sleep apnea   . Stroke [facility])     Patient Active Problem List   Diagnosis Date Noted  . Pain in both hands 02/16/2017  . Pain involving joint of finger of left hand 12/15/2016  . Advanced care planning/counseling discussion 11/14/2016  . Anemia 11/04/2016  . Cervical scoliosis 05/20/2016  . Torticollis 04/28/2016  . Height loss 01/21/2016  . Nocturnal oxygen desaturation 10/01/2015  . Benign hypertension with chronic kidney disease 06/18/2015  . B12 deficiency 03/19/2015  . Skin yeast infection 03/19/2015  . Sleep apnea 11/16/2014  . Anxiety 11/16/2014  . Allergic rhinitis 11/16/2014  . Malignant neoplasm of right breast ([place]) 11/16/2014  . GERD (gastroesophageal reflux disease) 11/16/2014  . COPD, moderate ([place]) 11/16/2014  . Obesity 11/16/2014  . Major depression 11/16/2014  . Insomnia  11/16/2014  . Chronic back pain 11/16/2014  . Diabetic neuropathy ([place]) 11/16/2014  . Diabetes mellitus with chronic kidney disease ([place]) 11/16/2014  . Chronic kidney disease, stage III (moderate) 11/16/2014  . Benign hypertensive renal disease 11/16/2014  . Hyperlipidemia 11/16/2014  . 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 Low back pain 09/30/2012  . Neuralgia of chest 09/30/2012  . Pain syndrome, chronic 09/30/2012    Past Surgical History:  Procedure Laterality Date  . ABDOMINAL HYSTERECTOMY  1987  . BACK SURGERY     Tailbone removed following fracture  . COLONOSCOPY WITH PROPOFOL N/A 01/01/2017   Procedure: COLONOSCOPY WITH PROPOFOL;  Surgeon: Jonathon Bellows, MD;  Location[facility] ENDOSCOPY;  Service: Endoscopy;  Laterality: N/A;  . ELBOW ARTHROSCOPY WITH TENDON RECONSTRUCTION    . ESOPHAGOGASTRODUODENOSCOPY (EGD) WITH PROPOFOL N/A 01/01/2017   Procedure: ESOPHAGOGASTRODUODENOSCOPY (EGD) WITH PROPOFOL;  Surgeon: Jonathon Bellows, MD;  Location: [facility] ENDOSCOPY;  Service: Endoscopy;  Laterality: N/A;  . MASTECTOMY  06/1997  . morphine pump  2011  . PLANTAR FASCIA RELEASE      Prior to Admission medications   Medication Sig Start Date End Date Taking? Authorizing Provider  ACCU-CHEK AVIVA PLUS test strip CHECK ONCE A DAY 01/07/17   Volney American, PA-C  ACCU-CHEK [facility] LANCETS lancets CHECK ONCE A DAY 01/07/17   Volney American, [place]  ADVAIR DISKUS 250-50 MCG/DOSE AEPB INHALE 1 PUFF TWICE DAILY 01/07/17   Kathrine Haddock, NP  albuterol (PROVENTIL HFA;VENTOLIN HFA) 108 (90 BASE) MCG/ACT inhaler Inhale 2 puffs into the lungs every 6 (six) hours as needed for  wheezing or shortness of breath.    [provider]  aspirin EC 81 MG tablet Take 81 mg by mouth daily.    [provider]  atorvastatin (LIPITOR) 20 MG tablet TAKE 1 TABLET EVERY DAY 01/09/17   Kathrine Haddock, NP  cyclobenzaprine (FLEXERIL) 10 MG tablet Take 1 tablet (10 mg total) by mouth at  bedtime. Patient not taking: Reported on 01/01/2017 04/29/16   Park Liter P, DO  diclofenac (VOLTAREN) 50 MG EC tablet TAKE 1 TABLET THREE TIMES DAILY (SUBSTITUTED FOR VOLTAREN) 02/06/17   Kathrine Haddock, NP  Ferrous Sulfate (SLOW FE PO) Take by mouth 2 (two) times daily.    [provider]  fluconazole (DIFLUCAN) 150 MG tablet Take 1 tablet (150 mg total) by mouth daily. 12/15/16   Kathrine Haddock, NP  folic acid (FOLVITE) 1 MG tablet Take 1 tablet (1 mg total) by mouth daily. 12/11/16 03/13/17  Jonathon Bellows, MD  gabapentin (NEURONTIN) 800 MG tablet Take 1 tablet (800 mg total) by mouth 3 (three) times daily. 01/12/17   Kathrine Haddock, NP  Iron-Vitamin C (IRON 100/C PO) Take 1 tablet by mouth 2 (two) times daily.    [provider]  JANUVIA 100 MG tablet TAKE 1 TABLET EVERY DAY 12/30/16   Johnson, Megan P, DO  LEVEMIR FLEXTOUCH 100 UNIT/ML Pen INJECT 42 UNITS SUBCUTANEOUSLY AT BEDTIME. Patient taking differently: INJECT 32 UNITS SUBCUTANEOUSLY AT BEDTIME. 04/08/16   Guadalupe Maple, MD  lisinopril (PRINIVIL,ZESTRIL) 5 MG tablet Take 1 tablet (5 mg total) by mouth daily. 07/14/16   Johnson, Megan P, DO  metFORMIN (GLUCOPHAGE-XR) 500 MG 24 hr tablet TAKE 2 TABLETS TWICE DAILY 01/07/17   Kathrine Haddock, NP  tiotropium (SPIRIVA HANDIHALER) 18 MCG inhalation capsule Place 18 mcg into inhaler and inhale daily.    [provider]  venlafaxine XR (EFFEXOR-XR) 150 MG 24 hr capsule TAKE 1 CAPSULE EVERY DAY 08/19/16   Kathrine Haddock, NP    Allergies  Allergen Reactions  . Other Palpitations    IV steroids  . Pain Patch [Menthol] Anaphylaxis  . Avelox [Moxifloxacin Hcl In Nacl] Other (See Comments)    Upset stomach  . Erythromycin Nausea Only and Other (See Comments)    Can take a Z-Pak just fine  . Fentanyl   . Oxycontin [Oxycodone] Hives    Family History  Problem Relation Age of Onset  . Cancer Mother 24       lung  . Thyroid disease Mother   . Cancer Father 73        Pancreatic  . Hypertension Sister   . Cancer Sister        "cancer on face"  . Hyperlipidemia Sister   . Osteoporosis Maternal Grandmother   . Hyperlipidemia Son   . Seizures Son   . Cancer Paternal Grandmother        colon  . Arthritis Paternal Grandfather     Social History Social History  Substance Use Topics  . Smoking status: Former Smoker    Types: Cigarettes    Quit date: 04/30/2004  . Smokeless tobacco: Never Used  . Alcohol use No    Review of Systems  Constitutional: Negative for fever. Eyes: Negative for visual changes. ENT: Negative for sore throat. Cardiovascular: Negative for chest pain. Respiratory: Negative for shortness of breath. Gastrointestinal: Negative for abdominal pain or bloody stool. Genitourinary: Negative for dysuria. Musculoskeletal: Negative for back pain. Skin: Negative for rash. Neurological: Negative for headache.  ____________________________________________   PHYSICAL EXAM:  VITAL SIGNS: ED Triage Vitals  Enc Vitals Group     BP 03/02/17 1332 (!) 142/74     Pulse Rate 03/02/17 1332 (!) 101     Resp 03/02/17 1332 20     Temp 03/02/17 1332 98.6 F (37 C)     Temp Source 03/02/17 1332 Oral     SpO2 03/02/17 1332 97 %     Weight 03/02/17 1333 152 lb (68.9 kg)     Height 03/02/17 1333 4\' 11"  (1.499 m)     Head Circumference --      Peak Flow --      Pain Score 03/02/17 1331 0     Pain Loc --      Pain Edu? --      Excl. in White? --      Constitutional: Alert and oriented. Well appearing and in no distress. HEENT   Head: Normocephalic and atraumatic.      Eyes: Conjunctivae are normal. Pupils equal and round.       Ears:         Nose: No congestion/rhinnorhea.   Mouth/Throat: Mucous membranes are moist.   Neck: No stridor. Cardiovascular/Chest: Normal rate, regular rhythm.  No murmurs, rubs, or gallops. Respiratory: Normal respiratory effort without tachypnea nor retractions. Breath sounds are clear and equal  bilaterally. No wheezes/rales/rhonchi. Gastrointestinal: Soft. No distention, no guarding, no rebound. Nontender.  Implanted morphine pump right abdomen. Genitourinary/rectal:Deferred Musculoskeletal: Nontender with normal range of motion in all extremities. No joint effusions.  No lower extremity tenderness.  No edema. Neurologic:  Normal speech and language. No gross or focal neurologic deficits are appreciated. Skin:  Skin is warm, dry and intact. No rash noted. Psychiatric: Mood and affect are normal. Speech and behavior are normal. Patient exhibits appropriate insight and judgment.   ____________________________________________  LABS (pertinent positives/negatives)  Labs Reviewed  LIPASE, BLOOD - Abnormal; Notable for the following:       Result Value   Lipase 58 (*)    All other components within normal limits  COMPREHENSIVE METABOLIC PANEL - Abnormal; Notable for the following:    Glucose, Bld 206 (*)    All other components within normal limits  CBC - Abnormal; Notable for the following:    WBC 13.6 (*)    RDW 15.9 (*)    All other components within normal limits  TROPONIN I    ____________________________________________    EKG I, Lisa Roca, MD, the attending physician have personally viewed and interpreted all ECGs.  98 bpm. Normal sinus rhythm. Narrow QRS. Normal axis. Normal ST and T-wave ____________________________________________  RADIOLOGY All Xrays were viewed by me. Imaging interpreted by Radiologist.  Chest x-ray two-view:  IMPRESSION: No active cardiopulmonary disease. __________________________________________  PROCEDURES  Procedure(s) performed: None  Critical Care performed: None  ____________________________________________   ED COURSE / ASSESSMENT AND PLAN  Pertinent labs & imaging results that were available during my care of the patient were reviewed by me and considered in my medical decision making (see chart for  details).    Ms. Lias is here for 9 times watery diarrhea this morning. She is feeling much better now on it's not had any additional diarrhea bowel movement here. Overall evaluation is reassuring with no abdominal pain. She is not reporting any bloody stools. She does have a slightly elevated white blood cell count, but nonspecific in the setting of her complaints from earlier today. No clinical dehydration. Patient is able take by mouth. I am going to give  her Imodium here. Unclear whether or not this may be viral versus irritable bowel.  Not suspicious of C. difficile at this point time. I did give her a copy in case she has continued diarrhea bowel movement she could collect a stool sample [place] primary care doctor.      CONSULTATIONS:   None   Patient / Family / Caregiver informed of clinical course, medical decision-making process, and agree with plan.   I discussed return precautions, follow-up instructions, and discharge instructions with patient and/or family.  Discharge Instructions : You were evaluated for diarrhea, and although no certain cause was found, your examine evaluation are overall reassuring in the emergency department today.  Return to the emergency department immediately for any fever, black or bloody stool, abdominal pain, dizziness or passing out, or with pediatric fluid losses with concern for dehydration such as dry mouth or not making urine, or any other symptoms concerning to you.  You may try over-the-counter loperamide, brand name Imodium, use as directed on label.  ___________________________________________   FINAL CLINICAL IMPRESSION(S) / ED DIAGNOSES   Final diagnoses:  Diarrhea, unspecified type              Note: This dictation was prepared with Dragon dictation. Any transcriptional errors that result from this process are unintentional    Lisa Roca, MD 03/02/17 1537

## 2017-03-02 NOTE — ED Notes (Signed)
Pt discharged to home.  Family member driving.  Discharge instructions reviewed.  Verbalized understanding.  No questions or concerns at this time.  Teach back verified.  Pt in NAD.  No items left in ED.   

## 2017-03-02 NOTE — Telephone Encounter (Signed)
Patient called stating she was feeling like she can not breathe and like she is going to pass out, also had 5 episodes of diarrhea within an hour, spoke with Amy who suggested patient be driven to the ER as she may need fluids. Patient will get husband to drive her to the ER

## 2017-03-04 ENCOUNTER — Telehealth: Payer: Self-pay | Admitting: Unknown Physician Specialty

## 2017-03-04 ENCOUNTER — Other Ambulatory Visit: Payer: Self-pay | Admitting: Family Medicine

## 2017-03-04 MED ORDER — ONDANSETRON HCL 4 MG PO TABS: 4.0000 mg | ORAL_TABLET | Freq: Three times a day (TID) | ORAL | 0 refills | Status: DC | PRN

## 2017-03-04 NOTE — Telephone Encounter (Signed)
Called and let the patient know that a medication was sent in for her.

## 2017-03-04 NOTE — Telephone Encounter (Signed)
Pt went to the emergency room Monday and she has stopped having diarrhea but she is still nauseous and she would like to know if she could have something for nausea sent to [facility] [address].

## 2017-03-04 NOTE — Telephone Encounter (Signed)
Routing to provider  

## 2017-03-04 NOTE — Telephone Encounter (Signed)
Done

## 2017-03-05 ENCOUNTER — Ambulatory Visit
Admission: RE | Admit: 2017-03-05 | Discharge: 2017-03-05 | Disposition: A | Discharge status: Home / Self Care | Payer: Medicare HMO | Source: Ambulatory Visit | Attending: Unknown Physician Specialty | Admitting: Unknown Physician Specialty

## 2017-03-05 DIAGNOSIS — Z1231 Encounter for screening mammogram for malignant neoplasm of breast: Secondary | ICD-10-CM | POA: Insufficient documentation

## 2017-03-05 DIAGNOSIS — Z9011 Acquired absence of right breast and nipple: Secondary | ICD-10-CM | POA: Diagnosis not present

## 2017-03-05 DIAGNOSIS — J449 Chronic obstructive pulmonary disease, unspecified: Secondary | ICD-10-CM | POA: Diagnosis not present

## 2017-03-17 ENCOUNTER — Ambulatory Visit (INDEPENDENT_AMBULATORY_CARE_PROVIDER_SITE_OTHER): Payer: Medicare HMO | Admitting: Unknown Physician Specialty

## 2017-03-17 ENCOUNTER — Encounter: Payer: Self-pay | Admitting: Unknown Physician Specialty

## 2017-03-17 DIAGNOSIS — E538 Deficiency of other specified B group vitamins: Secondary | ICD-10-CM | POA: Diagnosis not present

## 2017-03-17 DIAGNOSIS — Z794 Long term (current) use of insulin: Secondary | ICD-10-CM | POA: Diagnosis not present

## 2017-03-17 DIAGNOSIS — F332 Major depressive disorder, recurrent severe without psychotic features: Secondary | ICD-10-CM | POA: Diagnosis not present

## 2017-03-17 DIAGNOSIS — E0822 Diabetes mellitus due to underlying condition with diabetic chronic kidney disease: Secondary | ICD-10-CM

## 2017-03-17 DIAGNOSIS — N183 Chronic kidney disease, stage 3 unspecified: Secondary | ICD-10-CM

## 2017-03-17 DIAGNOSIS — I129 Hypertensive chronic kidney disease with stage 1 through stage 4 chronic kidney disease, or unspecified chronic kidney disease: Secondary | ICD-10-CM | POA: Diagnosis not present

## 2017-03-17 MED ORDER — DULAGLUTIDE 0.75 MG/0.5ML ~~LOC~~ SOAJ: 0.7500 mg | SUBCUTANEOUS | 3 refills | Status: DC

## 2017-03-17 NOTE — Progress Notes (Signed)
BP 125/75   Pulse (!) 103   Temp 98.2 F (36.8 C)   Wt 154 lb 12.8 oz (70.2 kg)   LMP  (LMP Unknown)   SpO2 93%   BMI 31.27 kg/m    Subjective:    [name], female    DOB: [birth year], 66 y.o.   MRN: [id number]  HPI: Jordan Chan is a 66 y.o. female  Chief Complaint  Patient presents with  . Diabetes  . Depression   Diabetes Pt states she is on 38 u of Levemir.  She is getting between 130-140 in the AM.  States sugar spikes when she eats.    Insomnia Pt feels if she could sleep better everything would improve  Depression Is getting the advice of a friend who is a [place] counseling.  States she is getting better by writing things down. She is considering getting official counseling Depression screen [facility] 2/9 03/17/2017 02/16/2017 11/14/2016 07/14/2016 06/18/2015  Decreased Interest 1 1 0 1 1  Down, Depressed, Hopeless 2 1 1  0 2  PHQ - 2 Score 3 2 1 1 3   Altered sleeping 3 3 3 2 2   Tired, decreased energy 3 3 3 2 2   Change in appetite 0 0 2 0 0  Feeling bad or failure about yourself  0 0 0 0 0  Trouble concentrating 1 2 2 1 2   Moving slowly or fidgety/restless 0 0 0 0 0  Suicidal thoughts 0 0 0 0 0  PHQ-9 Score 10 10 11 6 9    Relevant past medical, surgical, family and social history reviewed and updated as indicated. Interim medical history since our last visit reviewed. Allergies and medications reviewed and updated.  Review of Systems  Per HPI unless specifically indicated above     Objective:    BP 125/75   Pulse (!) 103   Temp 98.2 F (36.8 C)   Wt 154 lb 12.8 oz (70.2 kg)   LMP  (LMP Unknown)   SpO2 93%   BMI 31.27 kg/m   Wt Readings from Last 3 Encounters:  03/17/17 154 lb 12.8 oz (70.2 kg)  03/02/17 152 lb (68.9 kg)  02/16/17 152 lb 6.4 oz (69.1 kg)    Physical Exam  Constitutional: She is oriented to person, place, and time. She appears well-developed and well-nourished. No distress.  HENT:  Head: Normocephalic  and atraumatic.  Eyes: Conjunctivae and lids are normal. Right eye exhibits no discharge. Left eye exhibits no discharge. No scleral icterus.  Neck: Normal range of motion. Neck supple. No JVD present. Carotid bruit is not present.  Cardiovascular: Normal rate, regular rhythm and normal heart sounds.   Pulmonary/Chest: Effort normal and breath sounds normal.  Abdominal: Normal appearance. There is no splenomegaly or hepatomegaly.  Musculoskeletal: Normal range of motion.  Neurological: She is alert and oriented to person, place, and time.  Skin: Skin is warm, dry and intact. No rash noted. No pallor.  Psychiatric: She has a normal mood and affect. Her behavior is normal. Judgment and thought content normal.    Results for orders placed or performed during the hospital encounter of 03/02/17  Lipase, blood  Result Value Ref Range   Lipase 58 (H) 11 - 51 U/L  Comprehensive metabolic panel  Result Value Ref Range   Sodium 136 135 - 145 mmol/L   Potassium 4.1 3.5 - 5.1 mmol/L   Chloride 102 101 - 111 mmol/L   CO2 27 22 - 32 mmol/L  Glucose, Bld 206 (H) 65 - 99 mg/dL   BUN 10 6 - 20 mg/dL   Creatinine, Ser 0.52 0.44 - 1.00 mg/dL   Calcium 9.1 8.9 - 10.3 mg/dL   Total Protein 8.1 6.5 - 8.1 g/dL   Albumin 4.0 3.5 - 5.0 g/dL   AST 22 15 - 41 U/L   ALT 15 14 - 54 U/L   Alkaline Phosphatase 64 38 - 126 U/L   Total Bilirubin 0.4 0.3 - 1.2 mg/dL   GFR calc non Af Amer >60 >60 mL/min   GFR calc Af Amer >60 >60 mL/min   Anion gap 7 5 - 15  CBC  Result Value Ref Range   WBC 13.6 (H) 3.6 - 11.0 K/uL   RBC 4.60 3.80 - 5.20 MIL/uL   Hemoglobin 12.8 12.0 - 16.0 g/dL   HCT 39.5 35.0 - 47.0 %   MCV 85.8 80.0 - 100.0 fL   MCH 27.8 26.0 - 34.0 pg   MCHC 32.4 32.0 - 36.0 g/dL   RDW 15.9 (H) 11.5 - 14.5 %   Platelets 373 150 - 440 K/uL  Troponin I  Result Value Ref Range   Troponin I <0.03 <0.03 ng/mL      Assessment & Plan:   Problem List Items Addressed This Visit      Unprioritized     B12 deficiency   Benign hypertensive renal disease    Stable, continue present medications.        Diabetes mellitus with chronic kidney disease ([place])    BS still high, especially with meals.  Start Trulicity .75 mg weekly and recheck next month.  Pt ed on possible need to decrease insulin.        Relevant Medications   Dulaglutide (TRULICITY) 9.38 HW/2.9HB SOPN   Major depression    Taking Effexor XR.  Still considering counseling          Follow up plan: Return in about 4 weeks (around 04/14/2017).

## 2017-03-17 NOTE — Assessment & Plan Note (Signed)
Taking Effexor XR.  Still considering counseling

## 2017-03-17 NOTE — Assessment & Plan Note (Signed)
BS still high, especially with meals.  Start Trulicity .75 mg weekly and recheck next month.  Pt ed on possible need to decrease insulin.

## 2017-03-17 NOTE — Assessment & Plan Note (Signed)
Stable, continue present medications.   

## 2017-03-24 ENCOUNTER — Telehealth: Payer: Self-pay | Admitting: Unknown Physician Specialty

## 2017-03-24 MED ORDER — PROMETHAZINE HCL 25 MG PO TABS: 25.0000 mg | ORAL_TABLET | Freq: Three times a day (TID) | ORAL | 0 refills | Status: DC | PRN

## 2017-03-24 NOTE — Telephone Encounter (Signed)
Pt called and stated that she had used the bathroom 6 times in an hour. She stated that her stool wasn't runny nor was she in pain while having the bowel movement. She has not had anything to eat or drink this morning but she is getting someone to bring her gatorade and 7up in hopes to keep these fluids down.

## 2017-03-24 NOTE — Telephone Encounter (Signed)
Routing to provider for advice.

## 2017-03-24 NOTE — Telephone Encounter (Signed)
Just keep up with fluids.  She can temporarily stop Metformin and see if it helps

## 2017-03-24 NOTE — Telephone Encounter (Signed)
Called and let patient know that the phenergan she requested was sent in for her.

## 2017-03-24 NOTE — Telephone Encounter (Signed)
Called and let patient know what Malachy Mood said. Patient states that she is very nauseated and states that she cannot take the zofran that Malachy Mood gave her before, states it gave her a headache. Patient wants to know if she can have some phenergan sent in instead.

## 2017-04-04 DIAGNOSIS — F1193 Opioid use, unspecified with withdrawal: Secondary | ICD-10-CM | POA: Diagnosis not present

## 2017-04-04 DIAGNOSIS — R110 Nausea: Secondary | ICD-10-CM | POA: Diagnosis not present

## 2017-04-04 DIAGNOSIS — E0590 Thyrotoxicosis, unspecified without thyrotoxic crisis or storm: Secondary | ICD-10-CM | POA: Diagnosis not present

## 2017-04-04 LAB — POCT GLUCOSE (DEVICE FOR HOME USE)

## 2017-04-05 DIAGNOSIS — J449 Chronic obstructive pulmonary disease, unspecified: Secondary | ICD-10-CM | POA: Diagnosis not present

## 2017-04-07 ENCOUNTER — Telehealth: Payer: Self-pay | Admitting: Unknown Physician Specialty

## 2017-04-07 NOTE — Telephone Encounter (Signed)
Pt called and stated that she had another episode over the weekend with the diarrhea and sweats and she went to urgent care. While at urgent care they did blood work and stated that she needed to follow up with [place] about her thyroid. She would like a referral to go to an endocrinologist.

## 2017-04-08 NOTE — Telephone Encounter (Signed)
[facility] returned my call. They are faxing over patient's records from most recent visit with them.

## 2017-04-08 NOTE — Telephone Encounter (Signed)
I need to know the results of the lab test please

## 2017-04-08 NOTE — Telephone Encounter (Signed)
Called patient and she stated that she went to [facility] over the weekend. Called [facility] to get OV note and labs but I had to leave a VM with the medical records department asking for a returned call. Will await a returned call or try to call them again this afternoon.

## 2017-04-08 NOTE — Telephone Encounter (Signed)
Called and spoke with patient. She states that urgent care strongly suggested to her that she see an endocrinologist for her thyroid because of the symptoms she is having. She also states that her blood sugar was 290 that morning when she had not ate anything.

## 2017-04-09 NOTE — Telephone Encounter (Signed)
Have not received records from [facility] so I called them back. They are going to resend records again.

## 2017-04-09 NOTE — Telephone Encounter (Signed)
Records received. Will give to [facility] to review in the morning when she returns to the office.

## 2017-04-10 NOTE — Telephone Encounter (Signed)
Please let pt know her notes showed no sign of thyroid problem.  I had Dr. Wynetta Emery review as well.  I am not sure where the diagnosis of thyroid problem came from.

## 2017-04-10 NOTE — Telephone Encounter (Signed)
Spoke with patient. She stated that diagnosis was made because of all her symptoms. She stated she has researched online and all her symptoms can be contributed to thyroid issues. Pt states that the provider at [facility] told her that they blood work was not necessarily reliable because it was only a basic panel and that a specialist could do a much in depth workup. Please advise.

## 2017-04-10 NOTE — Telephone Encounter (Signed)
Called and let patient know what Malachy Mood said. Patient states that she is willing to come in and talk with Malachy Mood but she wants to know what is wrong with her because she is tired of feeling like she does. She is tired of laying with wet rags on her body she states. Scheduled patient an appointment for Monday 04/13/17. While on the phone, Malachy Mood wanted to speak with the patient so I transferred the call to [place].

## 2017-04-10 NOTE — Telephone Encounter (Signed)
The symptoms she is having can be attributed to a number of things.  I cannot refer with normal thyroid tests.  It might be best if she can come in and we can talk

## 2017-04-10 NOTE — Telephone Encounter (Signed)
Discussed with pt about symptoms.  Will see her Monday AM

## 2017-04-13 ENCOUNTER — Other Ambulatory Visit: Payer: Self-pay | Admitting: Unknown Physician Specialty

## 2017-04-13 ENCOUNTER — Ambulatory Visit (INDEPENDENT_AMBULATORY_CARE_PROVIDER_SITE_OTHER): Payer: Medicare HMO | Admitting: Unknown Physician Specialty

## 2017-04-13 ENCOUNTER — Telehealth: Payer: Self-pay | Admitting: Unknown Physician Specialty

## 2017-04-13 ENCOUNTER — Encounter: Payer: Self-pay | Admitting: Unknown Physician Specialty

## 2017-04-13 VITALS — BP 115/74 | HR 105 | Temp 98.2°F | Wt 150.4 lb

## 2017-04-13 DIAGNOSIS — R6889 Other general symptoms and signs: Secondary | ICD-10-CM

## 2017-04-13 DIAGNOSIS — R197 Diarrhea, unspecified: Secondary | ICD-10-CM | POA: Diagnosis not present

## 2017-04-13 DIAGNOSIS — Z23 Encounter for immunization: Secondary | ICD-10-CM

## 2017-04-13 DIAGNOSIS — R110 Nausea: Secondary | ICD-10-CM | POA: Diagnosis not present

## 2017-04-13 DIAGNOSIS — R000 Tachycardia, unspecified: Secondary | ICD-10-CM | POA: Insufficient documentation

## 2017-04-13 DIAGNOSIS — E538 Deficiency of other specified B group vitamins: Secondary | ICD-10-CM

## 2017-04-13 DIAGNOSIS — R002 Palpitations: Secondary | ICD-10-CM

## 2017-04-13 DIAGNOSIS — R451 Restlessness and agitation: Secondary | ICD-10-CM | POA: Diagnosis not present

## 2017-04-13 MED ORDER — VERAPAMIL HCL ER 120 MG PO TBCR: 120.0000 mg | EXTENDED_RELEASE_TABLET | Freq: Every day | ORAL | 1 refills | Status: DC

## 2017-04-13 NOTE — Patient Instructions (Addendum)

## 2017-04-13 NOTE — Progress Notes (Signed)
BP 115/74   Pulse (!) 105   Temp 98.2 F (36.8 C)   Wt 150 lb 6.4 oz (68.2 kg)   LMP  (LMP Unknown)   SpO2 96%   BMI 30.38 kg/m    Subjective:    [name], female    DOB: 02/14/1951, 66 y.o.   MRN: [id number]  HPI: Jordan Chan is a 66 y.o. female  Chief Complaint  Patient presents with  . Hot Flashes    pt states she has been getting extremly hot to where she has to remove clothes to get cooled down   Pt is here to discuss intermittent bouts of severe nausea and extreme heat in which she wraps herself up in wet towels and sits in front of a fan.  States she gets agitated and can't stay still and is up and down and gets weak.  States this happened 3 days in the last month and accompanied by multiple bouts of diarrhea but no abdominal pain.  She has been to urgent care last week.  Those notes were reviewed and labs, including thyroid were normal.  Blood sugars are typically high during this period of time.  Her last bout was Saturday 1 week ago.  No episodes since.  She still has intermittent periods of intense heat but the feeling of nausea.  These episodes are usually in the AM between 6-9A and wake her up but not always.  States she is really like this for several days after an attack but the intensity lasts for hours.    Relevant past medical, surgical, family and social history reviewed and updated as indicated. Interim medical history since our last visit reviewed. Allergies and medications reviewed and updated.  Review of Systems  Per HPI unless specifically indicated above     Objective:    BP 115/74   Pulse (!) 105   Temp 98.2 F (36.8 C)   Wt 150 lb 6.4 oz (68.2 kg)   LMP  (LMP Unknown)   SpO2 96%   BMI 30.38 kg/m   Wt Readings from Last 3 Encounters:  04/13/17 150 lb 6.4 oz (68.2 kg)  03/17/17 154 lb 12.8 oz (70.2 kg)  03/02/17 152 lb (68.9 kg)    Physical Exam  Constitutional: She is oriented to person, place, and time. She  appears well-developed and well-nourished. No distress.  HENT:  Head: Normocephalic and atraumatic.  Eyes: Conjunctivae and lids are normal. Right eye exhibits no discharge. Left eye exhibits no discharge. No scleral icterus.  Neck: Normal range of motion. Neck supple. No JVD present. Carotid bruit is not present.  Cardiovascular: Normal rate, regular rhythm and normal heart sounds.   Pulmonary/Chest: Effort normal and breath sounds normal.  Abdominal: Normal appearance. There is no splenomegaly or hepatomegaly.  Musculoskeletal: Normal range of motion.  Neurological: She is alert and oriented to person, place, and time.  Skin: Skin is warm, dry and intact. No rash noted. No pallor.  Psychiatric: She has a normal mood and affect. Her behavior is normal. Judgment and thought content normal.    Results for orders placed or performed during the hospital encounter of 03/02/17  Lipase, blood  Result Value Ref Range   Lipase 58 (H) 11 - 51 U/L  Comprehensive metabolic panel  Result Value Ref Range   Sodium 136 135 - 145 mmol/L   Potassium 4.1 3.5 - 5.1 mmol/L   Chloride 102 101 - 111 mmol/L   CO2 27 22 -  32 mmol/L   Glucose, Bld 206 (H) 65 - 99 mg/dL   BUN 10 6 - 20 mg/dL   Creatinine, Ser 0.52 0.44 - 1.00 mg/dL   Calcium 9.1 8.9 - 10.3 mg/dL   Total Protein 8.1 6.5 - 8.1 g/dL   Albumin 4.0 3.5 - 5.0 g/dL   AST 22 15 - 41 U/L   ALT 15 14 - 54 U/L   Alkaline Phosphatase 64 38 - 126 U/L   Total Bilirubin 0.4 0.3 - 1.2 mg/dL   GFR calc non Af Amer >60 >60 mL/min   GFR calc Af Amer >60 >60 mL/min   Anion gap 7 5 - 15  CBC  Result Value Ref Range   WBC 13.6 (H) 3.6 - 11.0 K/uL   RBC 4.60 3.80 - 5.20 MIL/uL   Hemoglobin 12.8 12.0 - 16.0 g/dL   HCT 39.5 35.0 - 47.0 %   MCV 85.8 80.0 - 100.0 fL   MCH 27.8 26.0 - 34.0 pg   MCHC 32.4 32.0 - 36.0 g/dL   RDW 15.9 (H) 11.5 - 14.5 %   Platelets 373 150 - 440 K/uL  Troponin I  Result Value Ref Range   Troponin I <0.03 <0.03 ng/mL        Assessment & Plan:   Problem List Items Addressed This Visit      Unprioritized   Tachycardia   Relevant Orders   Comprehensive metabolic panel    Other Visit Diagnoses    Need for influenza vaccination    -  Primary   Relevant Orders   Flu vaccine HIGH DOSE PF (Completed)   Nausea       Relevant Orders   US Abdomen Complete   Amylase   Lipase   CBC with Differential/Platelet   Heat intolerance       Relevant Orders   Comprehensive metabolic panel   Heart palpitations       Diarrhea, unspecified type       Relevant Orders   US Abdomen Complete   CBC with Differential/Platelet   Agitation          Pt with a constellation of the above symptoms.  All taken together very distressing.  ? Related to panic, gall bladder...  Order abdominal ultrasound.  Start Verapamil.    Follow up plan: Return in about 2 weeks (around 04/27/2017).

## 2017-04-13 NOTE — Telephone Encounter (Signed)
Patient called to request prescription for verapamil be sent to [facility] on [address].   Please Advise.  Thank you

## 2017-04-13 NOTE — Telephone Encounter (Signed)
Cheryl, the verapamil was sent to [facility]. Can we resend to [facility] please?

## 2017-04-13 NOTE — Telephone Encounter (Signed)
Called and let patient know that her prescription was sent to [facility] for her.

## 2017-04-14 LAB — CBC WITH DIFFERENTIAL/PLATELET
Basophils Absolute: 0.1 10*3/uL (ref 0.0–0.2)
Basos: 1 %
EOS (ABSOLUTE): 0.4 10*3/uL (ref 0.0–0.4)
Eos: 3 %
Hematocrit: 38 % (ref 34.0–46.6)
Hemoglobin: 12.3 g/dL (ref 11.1–15.9)
Immature Grans (Abs): 0 10*3/uL (ref 0.0–0.1)
Immature Granulocytes: 0 %
Lymphocytes Absolute: 3.5 10*3/uL — ABNORMAL HIGH (ref 0.7–3.1)
Lymphs: 34 %
MCH: 28.5 pg (ref 26.6–33.0)
MCHC: 32.4 g/dL (ref 31.5–35.7)
MCV: 88 fL (ref 79–97)
Monocytes Absolute: 0.8 10*3/uL (ref 0.1–0.9)
Monocytes: 8 %
Neutrophils Absolute: 5.7 10*3/uL (ref 1.4–7.0)
Neutrophils: 54 %
Platelets: 347 10*3/uL (ref 150–379)
RBC: 4.31 x10E6/uL (ref 3.77–5.28)
RDW: 15.1 % (ref 12.3–15.4)
WBC: 10.5 10*3/uL (ref 3.4–10.8)

## 2017-04-14 LAB — COMPREHENSIVE METABOLIC PANEL WITH GFR
AST: 17 IU/L (ref 0–40)
Albumin: 4.2 g/dL (ref 3.6–4.8)
BUN: 17 mg/dL (ref 8–27)
Creatinine, Ser: 0.81 mg/dL (ref 0.57–1.00)
GFR calc non Af Amer: 75 mL/min/{1.73_m2} (ref >59)
Globulin, Total: 3.3 g/dL (ref 1.5–4.5)
Sodium: 138 mmol/L (ref 134–144)

## 2017-04-14 LAB — COMPREHENSIVE METABOLIC PANEL
ALT: 13 IU/L (ref 0–32)
Albumin/Globulin Ratio: 1.3 (ref 1.2–2.2)
Alkaline Phosphatase: 60 IU/L (ref 39–117)
BUN/Creatinine Ratio: 21 (ref 12–28)
Bilirubin Total: 0.2 mg/dL (ref 0.0–1.2)
CO2: 26 mmol/L (ref 20–29)
Calcium: 9.2 mg/dL (ref 8.7–10.3)
Chloride: 99 mmol/L (ref 96–106)
GFR calc Af Amer: 87 mL/min/{1.73_m2} (ref >59)
Glucose: 153 mg/dL — ABNORMAL HIGH (ref 65–99)
Potassium: 5.3 mmol/L — ABNORMAL HIGH (ref 3.5–5.2)
Total Protein: 7.5 g/dL (ref 6.0–8.5)

## 2017-04-14 LAB — AMYLASE: Amylase: 62 U/L (ref 31–124)

## 2017-04-14 LAB — LIPASE: Lipase: 31 U/L (ref 14–72)

## 2017-04-20 ENCOUNTER — Ambulatory Visit
Admission: RE | Admit: 2017-04-20 | Discharge: 2017-04-20 | Disposition: A | Discharge status: Home / Self Care | Payer: Medicare HMO | Source: Ambulatory Visit | Attending: Unknown Physician Specialty | Admitting: Unknown Physician Specialty

## 2017-04-20 ENCOUNTER — Telehealth: Payer: Self-pay | Admitting: Unknown Physician Specialty

## 2017-04-20 ENCOUNTER — Ambulatory Visit: Payer: Medicare HMO | Admitting: Unknown Physician Specialty

## 2017-04-20 DIAGNOSIS — R110 Nausea: Secondary | ICD-10-CM | POA: Diagnosis present

## 2017-04-20 DIAGNOSIS — R197 Diarrhea, unspecified: Secondary | ICD-10-CM | POA: Insufficient documentation

## 2017-04-20 DIAGNOSIS — N281 Cyst of kidney, acquired: Secondary | ICD-10-CM | POA: Diagnosis not present

## 2017-04-20 DIAGNOSIS — R932 Abnormal findings on diagnostic imaging of liver and biliary tract: Secondary | ICD-10-CM | POA: Insufficient documentation

## 2017-04-20 MED ORDER — CLONAZEPAM 0.5 MG PO TABS
0.5000 mg | ORAL_TABLET | Freq: Two times a day (BID) | ORAL | 0 refills | Status: DC | PRN
Start: 2017-04-20 — End: 2017-05-01

## 2017-04-20 NOTE — Progress Notes (Signed)
Patient notified of results by phone.

## 2017-04-20 NOTE — Telephone Encounter (Signed)
Discussed with pt that [place] was normal.  No further episodes.  She states the most problematic part for her anxiety.  Will defer further evaluation unless it happens again.

## 2017-04-24 ENCOUNTER — Telehealth: Payer: Self-pay | Admitting: Unknown Physician Specialty

## 2017-04-24 DIAGNOSIS — Z462 Encounter for fitting and adjustment of other devices related to nervous system and special senses: Secondary | ICD-10-CM | POA: Diagnosis not present

## 2017-04-24 DIAGNOSIS — G894 Chronic pain syndrome: Secondary | ICD-10-CM | POA: Diagnosis not present

## 2017-04-24 NOTE — Telephone Encounter (Signed)
She will have to get hold of pain management.  I do not have a solution

## 2017-04-24 NOTE — Telephone Encounter (Signed)
Called and left patient a VM asking for her to please return my call.  

## 2017-04-24 NOTE — Telephone Encounter (Signed)
Called and let patient know what Cheryl said.  

## 2017-04-24 NOTE — Telephone Encounter (Signed)
She should continue the Verapamil

## 2017-04-24 NOTE — Telephone Encounter (Signed)
Patient's battery for morphine pump has died. Patient feels the pump is the issue with what has been going on.  Patient does not have her appt for pain clinic set up yet but knows her pump has stopped at 6:35 this morning.   Please Advise.  Thank you

## 2017-04-24 NOTE — Telephone Encounter (Signed)
Called and spoke to patient. She states that she is aware of her appointment with the pain clinic, Dr. Marcha Solders. She states that she got in touch with him and told him that her morphine pump stopped working and he cannot see her until next week. She states that Dr. Marcha Solders gave her some oral morphine to take in the meantime but the pharmacist scared her talking about morphine overdoses. Patient states that she asked Dr. Darral Dash if the morphine pump could be causing her heart to race and she states that Dr. Darral Dash said yes. Patient wants to know if she should stop her verapamil until she sees him next week. She states she is scared that it may slow her heart rate but she is afraid of it beating so fast as well. Patient states that she does not know what to do.

## 2017-04-24 NOTE — Telephone Encounter (Signed)
According to chart review, patient has appointment with pain management 04/30/17. Jordan Chan, is there anything we can do in the meantime for the patient?

## 2017-04-24 NOTE — Telephone Encounter (Signed)
Patient returned call for CMA. Informed patient she will receive a call back.

## 2017-04-25 ENCOUNTER — Inpatient Hospital Stay
Admission: EM | Admit: 2017-04-25 | Discharge: 2017-05-01 | DRG: 896 | Disposition: A | Discharge status: Home / Self Care | Payer: Medicare HMO | Attending: Internal Medicine | Admitting: Internal Medicine

## 2017-04-25 ENCOUNTER — Emergency Department: Payer: Medicare HMO

## 2017-04-25 DIAGNOSIS — I428 Other cardiomyopathies: Secondary | ICD-10-CM | POA: Diagnosis not present

## 2017-04-25 DIAGNOSIS — Z881 Allergy status to other antibiotic agents status: Secondary | ICD-10-CM

## 2017-04-25 DIAGNOSIS — M545 Low back pain: Secondary | ICD-10-CM | POA: Diagnosis present

## 2017-04-25 DIAGNOSIS — I5181 Takotsubo syndrome: Secondary | ICD-10-CM | POA: Diagnosis not present

## 2017-04-25 DIAGNOSIS — Z801 Family history of malignant neoplasm of trachea, bronchus and lung: Secondary | ICD-10-CM | POA: Diagnosis not present

## 2017-04-25 DIAGNOSIS — Z853 Personal history of malignant neoplasm of breast: Secondary | ICD-10-CM

## 2017-04-25 DIAGNOSIS — K219 Gastro-esophageal reflux disease without esophagitis: Secondary | ICD-10-CM | POA: Diagnosis present

## 2017-04-25 DIAGNOSIS — Z885 Allergy status to narcotic agent status: Secondary | ICD-10-CM

## 2017-04-25 DIAGNOSIS — F419 Anxiety disorder, unspecified: Secondary | ICD-10-CM | POA: Diagnosis not present

## 2017-04-25 DIAGNOSIS — Z9011 Acquired absence of right breast and nipple: Secondary | ICD-10-CM | POA: Diagnosis not present

## 2017-04-25 DIAGNOSIS — Z7982 Long term (current) use of aspirin: Secondary | ICD-10-CM

## 2017-04-25 DIAGNOSIS — G9340 Encephalopathy, unspecified: Secondary | ICD-10-CM | POA: Diagnosis not present

## 2017-04-25 DIAGNOSIS — Y828 Other medical devices associated with adverse incidents: Secondary | ICD-10-CM | POA: Diagnosis present

## 2017-04-25 DIAGNOSIS — I214 Non-ST elevation (NSTEMI) myocardial infarction: Secondary | ICD-10-CM

## 2017-04-25 DIAGNOSIS — Z8349 Family history of other endocrine, nutritional and metabolic diseases: Secondary | ICD-10-CM | POA: Diagnosis not present

## 2017-04-25 DIAGNOSIS — I959 Hypotension, unspecified: Secondary | ICD-10-CM | POA: Diagnosis present

## 2017-04-25 DIAGNOSIS — N183 Chronic kidney disease, stage 3 (moderate): Secondary | ICD-10-CM | POA: Diagnosis present

## 2017-04-25 DIAGNOSIS — M1990 Unspecified osteoarthritis, unspecified site: Secondary | ICD-10-CM | POA: Diagnosis present

## 2017-04-25 DIAGNOSIS — I429 Cardiomyopathy, unspecified: Secondary | ICD-10-CM | POA: Diagnosis not present

## 2017-04-25 DIAGNOSIS — R079 Chest pain, unspecified: Secondary | ICD-10-CM | POA: Diagnosis not present

## 2017-04-25 DIAGNOSIS — J449 Chronic obstructive pulmonary disease, unspecified: Secondary | ICD-10-CM | POA: Diagnosis present

## 2017-04-25 DIAGNOSIS — Z923 Personal history of irradiation: Secondary | ICD-10-CM

## 2017-04-25 DIAGNOSIS — F1193 Opioid use, unspecified with withdrawal: Secondary | ICD-10-CM

## 2017-04-25 DIAGNOSIS — Z9221 Personal history of antineoplastic chemotherapy: Secondary | ICD-10-CM

## 2017-04-25 DIAGNOSIS — R748 Abnormal levels of other serum enzymes: Secondary | ICD-10-CM | POA: Diagnosis not present

## 2017-04-25 DIAGNOSIS — F4323 Adjustment disorder with mixed anxiety and depressed mood: Secondary | ICD-10-CM | POA: Diagnosis not present

## 2017-04-25 DIAGNOSIS — Z87891 Personal history of nicotine dependence: Secondary | ICD-10-CM

## 2017-04-25 DIAGNOSIS — I110 Hypertensive heart disease with heart failure: Secondary | ICD-10-CM | POA: Diagnosis not present

## 2017-04-25 DIAGNOSIS — E1122 Type 2 diabetes mellitus with diabetic chronic kidney disease: Secondary | ICD-10-CM | POA: Diagnosis present

## 2017-04-25 DIAGNOSIS — Z90710 Acquired absence of both cervix and uterus: Secondary | ICD-10-CM | POA: Diagnosis not present

## 2017-04-25 DIAGNOSIS — Z8262 Family history of osteoporosis: Secondary | ICD-10-CM | POA: Diagnosis not present

## 2017-04-25 DIAGNOSIS — Z888 Allergy status to other drugs, medicaments and biological substances status: Secondary | ICD-10-CM

## 2017-04-25 DIAGNOSIS — R112 Nausea with vomiting, unspecified: Secondary | ICD-10-CM | POA: Diagnosis not present

## 2017-04-25 DIAGNOSIS — Z7984 Long term (current) use of oral hypoglycemic drugs: Secondary | ICD-10-CM | POA: Diagnosis not present

## 2017-04-25 DIAGNOSIS — R918 Other nonspecific abnormal finding of lung field: Secondary | ICD-10-CM | POA: Diagnosis not present

## 2017-04-25 DIAGNOSIS — G4700 Insomnia, unspecified: Secondary | ICD-10-CM | POA: Diagnosis present

## 2017-04-25 DIAGNOSIS — G8929 Other chronic pain: Secondary | ICD-10-CM | POA: Diagnosis present

## 2017-04-25 DIAGNOSIS — T85615A Breakdown (mechanical) of other nervous system device, implant or graft, initial encounter: Secondary | ICD-10-CM | POA: Diagnosis present

## 2017-04-25 DIAGNOSIS — F1123 Opioid dependence with withdrawal: Principal | ICD-10-CM | POA: Diagnosis present

## 2017-04-25 DIAGNOSIS — Z808 Family history of malignant neoplasm of other organs or systems: Secondary | ICD-10-CM

## 2017-04-25 DIAGNOSIS — R009 Unspecified abnormalities of heart beat: Secondary | ICD-10-CM | POA: Diagnosis not present

## 2017-04-25 DIAGNOSIS — Z8249 Family history of ischemic heart disease and other diseases of the circulatory system: Secondary | ICD-10-CM | POA: Diagnosis not present

## 2017-04-25 DIAGNOSIS — I5021 Acute systolic (congestive) heart failure: Secondary | ICD-10-CM | POA: Diagnosis present

## 2017-04-25 DIAGNOSIS — Z800 Family history of malignant neoplasm of digestive organs: Secondary | ICD-10-CM

## 2017-04-25 DIAGNOSIS — K589 Irritable bowel syndrome without diarrhea: Secondary | ICD-10-CM | POA: Diagnosis present

## 2017-04-25 DIAGNOSIS — R9431 Abnormal electrocardiogram [ECG] [EKG]: Secondary | ICD-10-CM | POA: Diagnosis not present

## 2017-04-25 DIAGNOSIS — G4733 Obstructive sleep apnea (adult) (pediatric): Secondary | ICD-10-CM | POA: Diagnosis present

## 2017-04-25 DIAGNOSIS — G894 Chronic pain syndrome: Secondary | ICD-10-CM | POA: Diagnosis not present

## 2017-04-25 DIAGNOSIS — I252 Old myocardial infarction: Secondary | ICD-10-CM

## 2017-04-25 DIAGNOSIS — R000 Tachycardia, unspecified: Secondary | ICD-10-CM | POA: Diagnosis not present

## 2017-04-25 DIAGNOSIS — M549 Dorsalgia, unspecified: Secondary | ICD-10-CM

## 2017-04-25 DIAGNOSIS — I2510 Atherosclerotic heart disease of native coronary artery without angina pectoris: Secondary | ICD-10-CM | POA: Diagnosis not present

## 2017-04-25 DIAGNOSIS — Z8261 Family history of arthritis: Secondary | ICD-10-CM

## 2017-04-25 HISTORY — DX: Low back pain, unspecified: M54.50

## 2017-04-25 HISTORY — DX: Low back pain: M54.5

## 2017-04-25 LAB — GLUCOSE, CAPILLARY
Glucose-Capillary: 230 mg/dL — ABNORMAL HIGH (ref 65–99)
Glucose-Capillary: 213 mg/dL — ABNORMAL HIGH (ref 65–99)
Glucose-Capillary: 156 mg/dL — ABNORMAL HIGH (ref 65–99)

## 2017-04-25 LAB — CBC WITH DIFFERENTIAL/PLATELET
Basophils Absolute: 0.1 K/uL (ref 0–0.1)
Basophils Relative: 1 %
Eosinophils Absolute: 0 K/uL (ref 0–0.7)
Eosinophils Relative: 0 %
HCT: 43.6 % (ref 35.0–47.0)
Hemoglobin: 14.6 g/dL (ref 12.0–16.0)
Lymphocytes Relative: 11 %
Lymphs Abs: 2.1 K/uL (ref 1.0–3.6)
MCH: 29.3 pg (ref 26.0–34.0)
MCHC: 33.5 g/dL (ref 32.0–36.0)
MCV: 87.6 fL (ref 80.0–100.0)
Monocytes Absolute: 1.1 K/uL — ABNORMAL HIGH (ref 0.2–0.9)
Monocytes Relative: 6 %
Neutro Abs: 15 K/uL — ABNORMAL HIGH (ref 1.4–6.5)
Neutrophils Relative %: 82 %
Platelets: 443 K/uL — ABNORMAL HIGH (ref 150–440)
RBC: 4.97 MIL/uL (ref 3.80–5.20)
RDW: 15.3 % — ABNORMAL HIGH (ref 11.5–14.5)
WBC: 18.3 K/uL — ABNORMAL HIGH (ref 3.6–11.0)

## 2017-04-25 LAB — URINALYSIS, COMPLETE (UACMP) WITH MICROSCOPIC
Bacteria, UA: NONE SEEN
Bilirubin Urine: NEGATIVE
Glucose, UA: 50 mg/dL — AB
Ketones, ur: 20 mg/dL — AB
Leukocytes, UA: NEGATIVE
Nitrite: NEGATIVE
Protein, ur: 30 mg/dL — AB
Specific Gravity, Urine: 1.017 (ref 1.005–1.030)
Squamous Epithelial / HPF: NONE SEEN
pH: 5 (ref 5.0–8.0)

## 2017-04-25 LAB — COMPREHENSIVE METABOLIC PANEL WITH GFR
ALT: 16 U/L (ref 14–54)
AST: 25 U/L (ref 15–41)
Albumin: 4.2 g/dL (ref 3.5–5.0)
Alkaline Phosphatase: 61 U/L (ref 38–126)
Anion gap: 10 (ref 5–15)
BUN: 11 mg/dL (ref 6–20)
CO2: 27 mmol/L (ref 22–32)
Calcium: 9.7 mg/dL (ref 8.9–10.3)
Chloride: 101 mmol/L (ref 101–111)
Creatinine, Ser: 0.63 mg/dL (ref 0.44–1.00)
GFR calc Af Amer: >60 mL/min
GFR calc non Af Amer: >60 mL/min
Glucose, Bld: 210 mg/dL — ABNORMAL HIGH (ref 65–99)
Potassium: 3.8 mmol/L (ref 3.5–5.1)
Sodium: 138 mmol/L (ref 135–145)
Total Bilirubin: 0.6 mg/dL (ref 0.3–1.2)
Total Protein: 8.8 g/dL — ABNORMAL HIGH (ref 6.5–8.1)

## 2017-04-25 LAB — MRSA PCR SCREENING: MRSA by PCR: NEGATIVE

## 2017-04-25 LAB — TROPONIN I: Troponin I: <0.03 ng/mL

## 2017-04-25 MED ORDER — ONDANSETRON HCL 4 MG/2ML IJ SOLN
4.0000 mg | Freq: Four times a day (QID) | INTRAMUSCULAR | Status: DC | PRN
Start: 2017-04-25 — End: 2017-05-01

## 2017-04-25 MED ORDER — ALBUTEROL SULFATE (2.5 MG/3ML) 0.083% IN NEBU
2.5000 mg | INHALATION_SOLUTION | Freq: Four times a day (QID) | RESPIRATORY_TRACT | Status: DC | PRN
  Administered 2017-04-26 – 2017-04-27 (×3): 2.5 mg via RESPIRATORY_TRACT
  Filled 2017-04-25 (×4): qty 3

## 2017-04-25 MED ORDER — TIOTROPIUM BROMIDE MONOHYDRATE 18 MCG IN CAPS
18.0000 ug | ORAL_CAPSULE | Freq: Every day | RESPIRATORY_TRACT | Status: DC
  Administered 2017-04-27 – 2017-05-01 (×5): 18 ug via RESPIRATORY_TRACT
  Filled 2017-04-25 (×2): qty 5

## 2017-04-25 MED ORDER — HALOPERIDOL LACTATE 5 MG/ML IJ SOLN
1.0000 mg | Freq: Four times a day (QID) | INTRAMUSCULAR | Status: DC | PRN
  Administered 2017-04-26 – 2017-04-27 (×3): 1 mg via INTRAVENOUS
  Filled 2017-04-25 (×3): qty 1

## 2017-04-25 MED ORDER — BISACODYL 10 MG RE SUPP
10.0000 mg | Freq: Every day | RECTAL | Status: DC | PRN
Start: 2017-04-25 — End: 2017-05-01

## 2017-04-25 MED ORDER — SODIUM CHLORIDE 0.9 % IV BOLUS (SEPSIS)
1000.0000 mL | Freq: Once | INTRAVENOUS | Status: AC
  Administered 2017-04-25: 1000 mL via INTRAVENOUS

## 2017-04-25 MED ORDER — ONDANSETRON HCL 4 MG/2ML IJ SOLN
4.0000 mg | Freq: Once | INTRAMUSCULAR | Status: AC
  Administered 2017-04-25: 4 mg via INTRAVENOUS

## 2017-04-25 MED ORDER — HALOPERIDOL LACTATE 5 MG/ML IJ SOLN
0.5000 mg | Freq: Once | INTRAMUSCULAR | Status: AC
  Administered 2017-04-25: 0.5 mg via INTRAVENOUS
  Filled 2017-04-25: qty 1

## 2017-04-25 MED ORDER — LORAZEPAM 2 MG/ML IJ SOLN
0.5000 mg | INTRAMUSCULAR | Status: DC | PRN
Start: 2017-04-25 — End: 2017-05-01
  Administered 2017-04-25 – 2017-04-29 (×11): 0.5 mg via INTRAVENOUS
  Filled 2017-04-25 (×11): qty 1

## 2017-04-25 MED ORDER — ONDANSETRON HCL 4 MG/2ML IJ SOLN
INTRAMUSCULAR | Status: AC
  Administered 2017-04-25: 4 mg via INTRAVENOUS
  Filled 2017-04-25: qty 2

## 2017-04-25 MED ORDER — METOCLOPRAMIDE HCL 5 MG/ML IJ SOLN
INTRAMUSCULAR | Status: AC
Start: 2017-04-25 — End: 2017-04-25
  Administered 2017-04-25: 10 mg via INTRAVENOUS
  Filled 2017-04-25: qty 2

## 2017-04-25 MED ORDER — METOCLOPRAMIDE HCL 5 MG/ML IJ SOLN
10.0000 mg | Freq: Once | INTRAMUSCULAR | Status: AC
  Administered 2017-04-25: 10 mg via INTRAVENOUS

## 2017-04-25 MED ORDER — MORPHINE SULFATE (PF) 4 MG/ML IV SOLN
4.0000 mg | Freq: Once | INTRAVENOUS | Status: AC
  Administered 2017-04-25: 4 mg via INTRAVENOUS

## 2017-04-25 MED ORDER — SODIUM CHLORIDE 0.9 % IV SOLN
INTRAVENOUS | Status: DC
  Administered 2017-04-25 – 2017-04-26 (×3): via INTRAVENOUS
  Administered 2017-04-26: 125 mL/h via INTRAVENOUS
  Administered 2017-04-27 (×2): via INTRAVENOUS

## 2017-04-25 MED ORDER — DOCUSATE SODIUM 100 MG PO CAPS
100.0000 mg | ORAL_CAPSULE | Freq: Two times a day (BID) | ORAL | Status: DC
  Administered 2017-04-25 – 2017-04-29 (×7): 100 mg via ORAL
  Filled 2017-04-25 (×7): qty 1

## 2017-04-25 MED ORDER — ONDANSETRON HCL 4 MG/2ML IJ SOLN
4.0000 mg | Freq: Once | INTRAMUSCULAR | Status: AC
  Administered 2017-04-25: 4 mg via INTRAVENOUS
  Filled 2017-04-25: qty 2

## 2017-04-25 MED ORDER — MORPHINE SULFATE (PF) 4 MG/ML IV SOLN
INTRAVENOUS | Status: AC
  Administered 2017-04-25: 4 mg via INTRAVENOUS
  Filled 2017-04-25: qty 1

## 2017-04-25 MED ORDER — HALOPERIDOL LACTATE 5 MG/ML IJ SOLN
0.5000 mg | Freq: Once | INTRAMUSCULAR | Status: AC | PRN
  Administered 2017-04-25: 0.5 mg via INTRAVENOUS

## 2017-04-25 MED ORDER — MORPHINE SULFATE (PF) 4 MG/ML IV SOLN
4.0000 mg | Freq: Once | INTRAVENOUS | Status: AC
  Administered 2017-04-25: 4 mg via INTRAVENOUS
  Filled 2017-04-25: qty 1

## 2017-04-25 MED ORDER — ASPIRIN EC 81 MG PO TBEC
81.0000 mg | DELAYED_RELEASE_TABLET | Freq: Every day | ORAL | Status: DC
  Administered 2017-04-26 – 2017-05-01 (×5): 81 mg via ORAL
  Filled 2017-04-25 (×5): qty 1

## 2017-04-25 MED ORDER — PROMETHAZINE HCL 25 MG/ML IJ SOLN
25.0000 mg | Freq: Once | INTRAMUSCULAR | Status: AC
  Administered 2017-04-25: 25 mg via INTRAVENOUS
  Filled 2017-04-25: qty 1

## 2017-04-25 MED ORDER — ACETAMINOPHEN 325 MG PO TABS
650.0000 mg | ORAL_TABLET | Freq: Four times a day (QID) | ORAL | Status: DC | PRN
  Administered 2017-04-28 (×2): 650 mg via ORAL
  Filled 2017-04-25 (×2): qty 2

## 2017-04-25 MED ORDER — PANTOPRAZOLE SODIUM 40 MG IV SOLR
40.0000 mg | Freq: Two times a day (BID) | INTRAVENOUS | Status: DC
  Administered 2017-04-25 – 2017-04-27 (×4): 40 mg via INTRAVENOUS
  Filled 2017-04-25 (×4): qty 40

## 2017-04-25 MED ORDER — SODIUM CHLORIDE 0.9 % IV BOLUS (SEPSIS)
500.0000 mL | Freq: Once | INTRAVENOUS | Status: AC
  Administered 2017-04-25: 500 mL via INTRAVENOUS

## 2017-04-25 MED ORDER — CLONIDINE HCL 0.1 MG PO TABS
0.2000 mg | ORAL_TABLET | Freq: Three times a day (TID) | ORAL | Status: DC
  Administered 2017-04-27 – 2017-05-01 (×11): 0.2 mg via ORAL
  Filled 2017-04-25 (×14): qty 2

## 2017-04-25 MED ORDER — ONDANSETRON HCL 4 MG PO TABS: 4.0000 mg | ORAL_TABLET | Freq: Four times a day (QID) | ORAL | Status: DC | PRN

## 2017-04-25 MED ORDER — VENLAFAXINE HCL ER 75 MG PO CP24
150.0000 mg | ORAL_CAPSULE | Freq: Every day | ORAL | Status: DC
  Administered 2017-04-26 – 2017-05-01 (×6): 150 mg via ORAL
  Filled 2017-04-25: qty 2
  Filled 2017-04-25 (×2): qty 1
  Filled 2017-04-25 (×5): qty 2
  Filled 2017-04-25 (×2): qty 1

## 2017-04-25 MED ORDER — CLONIDINE HCL 0.1 MG PO TABS
0.1000 mg | ORAL_TABLET | Freq: Once | ORAL | Status: AC
  Administered 2017-04-25: 0.1 mg via ORAL
  Filled 2017-04-25: qty 1

## 2017-04-25 MED ORDER — MOMETASONE FURO-FORMOTEROL FUM 200-5 MCG/ACT IN AERO
2.0000 | INHALATION_SPRAY | Freq: Two times a day (BID) | RESPIRATORY_TRACT | Status: DC
  Administered 2017-04-27 – 2017-05-01 (×9): 2 via RESPIRATORY_TRACT
  Filled 2017-04-25 (×2): qty 8.8

## 2017-04-25 MED ORDER — MORPHINE SULFATE (PF) 4 MG/ML IV SOLN
INTRAVENOUS | Status: AC
  Filled 2017-04-25: qty 1

## 2017-04-25 MED ORDER — INSULIN ASPART 100 UNIT/ML ~~LOC~~ SOLN
0.0000 [IU] | Freq: Three times a day (TID) | SUBCUTANEOUS | Status: DC
  Administered 2017-04-25: 3 [IU] via SUBCUTANEOUS
  Administered 2017-04-26 (×3): 2 [IU] via SUBCUTANEOUS
  Administered 2017-04-27: 5 [IU] via SUBCUTANEOUS
  Administered 2017-04-27: 7 [IU] via SUBCUTANEOUS
  Administered 2017-04-27: 5 [IU] via SUBCUTANEOUS
  Administered 2017-04-28: 1 [IU] via SUBCUTANEOUS
  Administered 2017-04-29: 2 [IU] via SUBCUTANEOUS
  Administered 2017-04-29: 1 [IU] via SUBCUTANEOUS
  Administered 2017-04-29: 2 [IU] via SUBCUTANEOUS
  Administered 2017-04-30: 5 [IU] via SUBCUTANEOUS
  Administered 2017-04-30: 3 [IU] via SUBCUTANEOUS
  Administered 2017-04-30 – 2017-05-01 (×2): 2 [IU] via SUBCUTANEOUS
  Administered 2017-05-01: 5 [IU] via SUBCUTANEOUS
  Filled 2017-04-25 (×16): qty 1

## 2017-04-25 MED ORDER — GABAPENTIN 400 MG PO CAPS
800.0000 mg | ORAL_CAPSULE | Freq: Three times a day (TID) | ORAL | Status: DC
  Administered 2017-04-25 – 2017-05-01 (×16): 800 mg via ORAL
  Filled 2017-04-25 (×16): qty 2

## 2017-04-25 MED ORDER — METOCLOPRAMIDE HCL 5 MG/ML IJ SOLN
5.0000 mg | Freq: Four times a day (QID) | INTRAMUSCULAR | Status: DC
  Administered 2017-04-25 – 2017-05-01 (×22): 5 mg via INTRAVENOUS
  Filled 2017-04-25 (×22): qty 2

## 2017-04-25 MED ORDER — ACETAMINOPHEN 650 MG RE SUPP: 650.0000 mg | Freq: Four times a day (QID) | RECTAL | Status: DC | PRN

## 2017-04-25 MED ORDER — CHLORHEXIDINE GLUCONATE 0.12 % MT SOLN
15.0000 mL | Freq: Two times a day (BID) | OROMUCOSAL | Status: DC
  Administered 2017-04-29 – 2017-05-01 (×4): 15 mL via OROMUCOSAL
  Filled 2017-04-25 (×6): qty 15

## 2017-04-25 MED ORDER — DEXMEDETOMIDINE HCL IN NACL 400 MCG/100ML IV SOLN
0.4000 ug/kg/h | INTRAVENOUS | Status: DC
  Administered 2017-04-25: 0.988 ug/kg/h via INTRAVENOUS
  Administered 2017-04-26: 0.4 ug/kg/h via INTRAVENOUS
  Administered 2017-04-26: 0.1 ug/kg/h via INTRAVENOUS
  Administered 2017-04-26: 0.4 ug/kg/h via INTRAVENOUS
  Administered 2017-04-27: 1.2 ug/kg/h via INTRAVENOUS
  Administered 2017-04-27: 0.6 ug/kg/h via INTRAVENOUS
  Filled 2017-04-25 (×5): qty 100

## 2017-04-25 MED ORDER — MORPHINE SULFATE ER 15 MG PO TBCR
60.0000 mg | EXTENDED_RELEASE_TABLET | Freq: Two times a day (BID) | ORAL | Status: DC
  Administered 2017-04-25 – 2017-04-27 (×5): 60 mg via ORAL
  Filled 2017-04-25 (×5): qty 4

## 2017-04-25 MED ORDER — HEPARIN SODIUM (PORCINE) 5000 UNIT/ML IJ SOLN
5000.0000 [IU] | Freq: Three times a day (TID) | INTRAMUSCULAR | Status: DC
  Administered 2017-04-25 – 2017-04-27 (×6): 5000 [IU] via SUBCUTANEOUS
  Filled 2017-04-25 (×6): qty 1

## 2017-04-25 NOTE — ED Notes (Signed)
Patients home clonidine patch removed

## 2017-04-25 NOTE — Progress Notes (Signed)
Patient has been having episodes of withdrawal. NP on floor to see patient. Restarted Precedex gtt. Ativan was ineffective. Took po medications, complaining of being thirsty and hot. Trying to take off clothes. Son at bedside, difficult to redirect.

## 2017-04-25 NOTE — ED Triage Notes (Signed)
Patient is breast cancer patient with implanted morphine pump that stopped working Friday morning. Pt c/o severe generalized pain and nausea.

## 2017-04-25 NOTE — H&P (Signed)
History and Physical    Jordan Chan DOB: 07-Mar-1951 DOA: 04/25/2017  Referring physician: Dr. Reita Cliche PCP: Kathrine Haddock, NP  Specialists: none  Chief Complaint: diffuse pain with N/V and lethargy  HPI: Jordan Chan is a 67 y.o. female has a past medical history significant for DM, COPD, and chronic back pain with intrathecal pump followed by [facility] now with N/V, confusion, and worsening pain. Her pump stopped working 3-4 days ago. She is scheduled for battery replacement next Wednesday. Now she is confused and lethargic with N/V and severe pain. In the ER, she is tachycardic and hypotensive. She is now admitted  Review of Systems: The patient denies  fever, weight loss,, vision loss, decreased hearing, hoarseness, chest pain, syncope, dyspnea on exertion, peripheral edema, balance deficits, hemoptysis, abdominal pain, melena, hematochezia, severe indigestion/heartburn, hematuria, incontinence, genital sores, muscle weakness, suspicious skin lesions, transient blindness, difficulty walking, depression, unusual weight change, abnormal bleeding, enlarged lymph nodes, angioedema, and breast masses.   Past Medical History:  Diagnosis Date  . Arthritis   . Asthma   . Breast cancer ([place]) 1998   right breast ca with mastectomy and chemotherapy and radiation  . Cancer ([place]) 1998   masectomy  . COPD (chronic obstructive pulmonary disease) ([place])   . Diabetes mellitus without complication ([place])   . Endometriosis   . GERD (gastroesophageal reflux disease)   . IBS (irritable bowel syndrome)   . Sleep apnea    Past Surgical History:  Procedure Laterality Date  . ABDOMINAL HYSTERECTOMY  1987  . BACK SURGERY     Tailbone removed following fracture  . COLONOSCOPY WITH PROPOFOL N/A 01/01/2017   Procedure: COLONOSCOPY WITH PROPOFOL;  Surgeon: Jonathon Bellows, MD;  Location: [facility] ENDOSCOPY;  Service: Endoscopy;  Laterality: N/A;  . ELBOW ARTHROSCOPY WITH TENDON  RECONSTRUCTION    . ESOPHAGOGASTRODUODENOSCOPY (EGD) WITH PROPOFOL N/A 01/01/2017   Procedure: ESOPHAGOGASTRODUODENOSCOPY (EGD) WITH PROPOFOL;  Surgeon: Jonathon Bellows, MD;  Location: [facility] ENDOSCOPY;  Service: Endoscopy;  Laterality: N/A;  . MASTECTOMY  06/1997  . morphine pump  2011  . PLANTAR FASCIA RELEASE     Social History:  reports that she quit smoking about 12 years ago. Her smoking use included Cigarettes. She has never used smokeless tobacco. She reports that she does not drink alcohol or use drugs.  Allergies  Allergen Reactions  . Other Palpitations    IV steroids  . Pain Patch [Menthol] Anaphylaxis  . Avelox [Moxifloxacin Hcl In Nacl] Other (See Comments)    Upset stomach  . Erythromycin Nausea Only and Other (See Comments)    Can take a Z-Pak just fine  . Fentanyl   . Oxycontin [Oxycodone] Hives    Family History  Problem Relation Age of Onset  . Cancer Mother 76       lung  . Thyroid disease Mother   . Cancer Father 27       Pancreatic  . Hypertension Sister   . Cancer Sister        "cancer on face"  . Hyperlipidemia Sister   . Osteoporosis Maternal Grandmother   . Hyperlipidemia Son   . Seizures Son   . Cancer Paternal Grandmother        colon  . Arthritis Paternal Grandfather   . Breast cancer Neg Hx     Prior to Admission medications   Medication Sig Start Date End Date Taking? Authorizing Provider  ADVAIR DISKUS 250-50 MCG/DOSE AEPB INHALE 1 PUFF TWICE DAILY 01/07/17  Yes Kathrine Haddock, NP  albuterol (PROVENTIL HFA;VENTOLIN HFA) 108 (90 BASE) MCG/ACT inhaler Inhale 2 puffs into the lungs every 6 (six) hours as needed for wheezing or shortness of breath.   Yes [provider]  aspirin EC 81 MG tablet Take 81 mg by mouth daily.   Yes [provider]  atorvastatin (LIPITOR) 20 MG tablet TAKE 1 TABLET EVERY DAY 01/09/17  Yes Kathrine Haddock, NP  clonazePAM (KLONOPIN) 0.5 MG tablet Take 1 tablet (0.5 mg total) by mouth 2 (two) times daily as  needed for anxiety. 04/20/17  Yes Kathrine Haddock, NP  cyclobenzaprine (FLEXERIL) 5 MG tablet Take 5 mg by mouth 2 (two) times daily as needed. 02/26/17  Yes [provider]  diclofenac (VOLTAREN) 50 MG EC tablet TAKE 1 TABLET THREE TIMES DAILY (SUBSTITUTED FOR VOLTAREN) 04/14/17  Yes Kathrine Haddock, NP  Dulaglutide (TRULICITY) 6.96 EX/5.2WU SOPN Inject 0.75 mg into the skin once a week. 03/17/17  Yes Kathrine Haddock, NP  gabapentin (NEURONTIN) 800 MG tablet Take 1 tablet (800 mg total) by mouth 3 (three) times daily. 01/12/17  Yes Kathrine Haddock, NP  LEVEMIR FLEXTOUCH 100 UNIT/ML Pen INJECT 42 UNITS SUBCUTANEOUSLY AT BEDTIME. Patient taking differently: INJECT 32 UNITS SUBCUTANEOUSLY AT BEDTIME. 04/08/16  Yes Crissman, Jeannette How, MD  lisinopril (PRINIVIL,ZESTRIL) 5 MG tablet Take 1 tablet (5 mg total) by mouth daily. 07/14/16  Yes Johnson, Megan P, DO  metFORMIN (GLUCOPHAGE-XR) 500 MG 24 hr tablet TAKE 2 TABLETS TWICE DAILY 01/07/17  Yes Kathrine Haddock, NP  promethazine (PHENERGAN) 25 MG tablet Take 1 tablet (25 mg total) by mouth every 8 (eight) hours as needed for nausea or vomiting. 03/24/17  Yes Kathrine Haddock, NP  tiotropium (SPIRIVA HANDIHALER) 18 MCG inhalation capsule Place 18 mcg into inhaler and inhale daily.   Yes [provider]  venlafaxine XR (EFFEXOR-XR) 150 MG 24 hr capsule TAKE 1 CAPSULE EVERY DAY 08/19/16  Yes Kathrine Haddock, NP  verapamil (CALAN-SR) 120 MG CR tablet Take 1 tablet (120 mg total) by mouth at bedtime. 04/13/17  Yes Kathrine Haddock, NP  ACCU-CHEK AVIVA PLUS test strip CHECK ONCE A DAY 01/07/17   Volney American, PA-C  ACCU-CHEK [facility] LANCETS lancets CHECK ONCE A DAY 01/07/17   Volney American, PA-C  fluconazole (DIFLUCAN) 150 MG tablet Take 1 tablet (150 mg total) by mouth daily. Patient not taking: Reported on 04/25/2017 12/15/16   Kathrine Haddock, NP   Physical Exam: Vitals:   04/25/17 1336 04/25/17 1344 04/25/17 1350 04/25/17 1400  BP:      Pulse:  (!) 136 (!) 138 (!) 144 (!) 140  Resp: (!) 23 18 (!) 24 (!) 26  Temp:      TempSrc:      SpO2: 100% 100% 100% 97%  Weight:         General:  [place]/AT, WDWN, in severe distress  Eyes: PERRL, EOMI, no scleral icterus, conjunctiva clear  ENT: moist oropharynx without exudate, TM's benign, dentition fair  Neck: supple, no lymphadenopathy. No bruits or thyromegaly  Cardiovascular: rapid rate with regular rhythm without MRG; 2+ peripheral pulses, no JVD, no peripheral edema  Respiratory: CTA biL, good air movement without wheezing, rhonchi or crackled. Respiratory effortincreased  Abdomen: soft, non tender to palpation, positive bowel sounds, no guarding, no rebound  Skin: no rashes or lesions  Musculoskeletal: normal bulk and tone, no joint swelling  Psychiatric: lethargic and somnolent  Neurologic: CN 2-12 grossly intact, Motor strength 5/5 in all 4 groups with symmetric  DTR's and non-focal sensory exam  Labs on Admission:  Basic Metabolic Panel:  Recent Labs Lab 04/25/17 0820  NA 138  K 3.8  CL 101  CO2 27  GLUCOSE 210*  BUN 11  CREATININE 0.63  CALCIUM 9.7   Liver Function Tests:  Recent Labs Lab 04/25/17 0820  AST 25  ALT 16  ALKPHOS 61  BILITOT 0.6  PROT 8.8*  ALBUMIN 4.2   No results for input(s): LIPASE, AMYLASE in the last 168 hours. No results for input(s): AMMONIA in the last 168 hours. CBC:  Recent Labs Lab 04/25/17 0820  WBC 18.3*  NEUTROABS 15.0*  HGB 14.6  HCT 43.6  MCV 87.6  PLT 443*   Cardiac Enzymes:  Recent Labs Lab 04/25/17 0820  TROPONINI <0.03    BNP (last 3 results) No results for input(s): BNP in the last 8760 hours.  ProBNP (last 3 results) No results for input(s): PROBNP in the last 8760 hours.  CBG:  Recent Labs Lab 04/25/17 0551  GLUCAP 230*    Radiological Exams on Admission: Dg Chest 2 View  Result Date: 04/25/2017 CLINICAL DATA:  History of breast cancer. EXAM: CHEST  2 VIEW COMPARISON:  March 02, 2017 FINDINGS: The heart size and mediastinal contours are within normal limits. Both lungs are clear. The visualized skeletal structures are unremarkable. IMPRESSION: No active cardiopulmonary disease. Electronically Signed   By: Dorise Bullion III M.D   On: 04/25/2017 11:35    EKG: Independently reviewed.  Assessment/Plan Principal Problem:   Opioid withdrawal (HCC) Active Problems:   Chronic back pain   Intractable nausea and vomiting   Acute encephalopathy   Will admit to Stepdown with IV fluids, MS Contin, po Clonidine, and prn Ativan and Haldol. Clear liquid diet. Consult the ICU team and Pain Management. Repeat labs in AM.  Diet: clear liquids Fluids: NS@125  DVT Prophylaxis: SQ Heparin  Code Status: FULL  Family Communication: yes  Disposition Plan: home  Time spent: 50 min

## 2017-04-25 NOTE — Progress Notes (Signed)
ELink notified of hypotension following precedex gtt initiation; we will turn drip off for now.  Other VS are WDL and patient rouses to stimulation.

## 2017-04-25 NOTE — ED Notes (Addendum)
Bumped nasal cannula up to 4L

## 2017-04-25 NOTE — ED Notes (Signed)
Placed pt on 2L nasal cannula because O2 sat were in mid 80 while sleeping.

## 2017-04-25 NOTE — Progress Notes (Signed)
eLink Physician-Brief Progress Note Patient Name: Jordan Chan DOB: 1951-06-20 MRN: [id number]   Date of Service  04/25/2017  HPI/Events of Note  New patient evaluation  eICU Interventions  Nothing further to add     Intervention Category Major Interventions: Other:  Lavance Beazer 04/25/2017, 5:14 PM

## 2017-04-25 NOTE — ED Notes (Signed)
Pt daughter reported that battery on morphine died yesterday. Pt has been screaming in pain all day. Daughter at bedside.

## 2017-04-25 NOTE — Consult Note (Signed)
[facility] Consultation    SYNOPSIS   66 yo female on morphine pump, whose battery on the pump died, she has been experiencing severe opioid withdrawal symptoms.   ASSESSMENT/PLAN   Severe opioid withdrawal.  Severe agitation, delirium.  Essential hypertension, DM, COPD, OSA. Lung nodules.   --Precedex for agitation.  --Anti-HTN as needed.  --Levemir, SSI.  --DVT prophylaxis.   INTAKE / OUTPUT:  Intake/Output Summary (Last 24 hours) at 04/25/17 1657 Last data filed at 04/25/17 1341  Gross per 24 hour  Intake                0 ml  Output              280 ml  Net             -280 ml      MAJOR EVENTS/TEST RESULTS: --Admitted 9/22 with opioid withdrawal.   Best Practices  DVT Prophylaxis: heparin GI Prophylaxis: --  ---------------------------------------  ---------------------------------------   Name: Jordan Chan MRN: [id number] DOB: 1951/05/16    ADMISSION DATE:  04/25/2017 CONSULTATION DATE:  04/25/17   REFERRING MD :  Dr. Doy Hutching for delirium  CHIEF COMPLAINT:  AMS.    HISTORY OF PRESENT ILLNESS:   The patient is a 66 yo female with a history of OSA, COPD, DM, on morphine drip for chronic pain. She follows with [name] outpatient. The patient is currently agitated, therefore all history is obtained from the chart. She had noted that she was having increased agitation and pain. It was found that her intrathecal morphine pump battery was not functioning, she will not be able to have it changed for several days.   PAST MEDICAL HISTORY :  Past Medical History:  Diagnosis Date  . Arthritis   . Asthma   . Breast cancer ([place]) 1998   right breast ca with mastectomy and chemotherapy and radiation  . Cancer ([place]) 1998   masectomy  . COPD (chronic obstructive pulmonary disease) ([place])   . Diabetes mellitus without complication ([place])   . Endometriosis   . GERD (gastroesophageal reflux disease)   . IBS (irritable bowel  syndrome)   . Sleep apnea    Past Surgical History:  Procedure Laterality Date  . ABDOMINAL HYSTERECTOMY  1987  . BACK SURGERY     Tailbone removed following fracture  . COLONOSCOPY WITH PROPOFOL N/A 01/01/2017   Procedure: COLONOSCOPY WITH PROPOFOL;  Surgeon: Jonathon Bellows, MD;  Location: [facility] ENDOSCOPY;  Service: Endoscopy;  Laterality: N/A;  . ELBOW ARTHROSCOPY WITH TENDON RECONSTRUCTION    . ESOPHAGOGASTRODUODENOSCOPY (EGD) WITH PROPOFOL N/A 01/01/2017   Procedure: ESOPHAGOGASTRODUODENOSCOPY (EGD) WITH PROPOFOL;  Surgeon: Jonathon Bellows, MD;  Location: [facility] ENDOSCOPY;  Service: Endoscopy;  Laterality: N/A;  . MASTECTOMY  06/1997  . morphine pump  2011  . PLANTAR FASCIA RELEASE     Prior to Admission medications   Medication Sig Start Date End Date Taking? Authorizing Provider  ADVAIR DISKUS 250-50 MCG/DOSE AEPB INHALE 1 PUFF TWICE DAILY 01/07/17  Yes Kathrine Haddock, NP  albuterol (PROVENTIL HFA;VENTOLIN HFA) 108 (90 BASE) MCG/ACT inhaler Inhale 2 puffs into the lungs every 6 (six) hours as needed for wheezing or shortness of breath.   Yes [provider]  aspirin EC 81 MG tablet Take 81 mg by mouth daily.   Yes [provider]  atorvastatin (LIPITOR) 20 MG tablet TAKE 1 TABLET EVERY DAY 01/09/17  Yes Kathrine Haddock, NP  clonazePAM (KLONOPIN) 0.5 MG tablet Take  1 tablet (0.5 mg total) by mouth 2 (two) times daily as needed for anxiety. 04/20/17  Yes Kathrine Haddock, NP  cyclobenzaprine (FLEXERIL) 5 MG tablet Take 5 mg by mouth 2 (two) times daily as needed. 02/26/17  Yes [provider]  diclofenac (VOLTAREN) 50 MG EC tablet TAKE 1 TABLET THREE TIMES DAILY (SUBSTITUTED FOR VOLTAREN) 04/14/17  Yes Kathrine Haddock, NP  Dulaglutide (TRULICITY) 0.93 OI/7.1IW SOPN Inject 0.75 mg into the skin once a week. 03/17/17  Yes Kathrine Haddock, NP  gabapentin (NEURONTIN) 800 MG tablet Take 1 tablet (800 mg total) by mouth 3 (three) times daily. 01/12/17  Yes Kathrine Haddock, NP  LEVEMIR  FLEXTOUCH 100 UNIT/ML Pen INJECT 42 UNITS SUBCUTANEOUSLY AT BEDTIME. Patient taking differently: INJECT 32 UNITS SUBCUTANEOUSLY AT BEDTIME. 04/08/16  Yes Crissman, Jeannette How, MD  lisinopril (PRINIVIL,ZESTRIL) 5 MG tablet Take 1 tablet (5 mg total) by mouth daily. 07/14/16  Yes Johnson, Megan P, DO  metFORMIN (GLUCOPHAGE-XR) 500 MG 24 hr tablet TAKE 2 TABLETS TWICE DAILY 01/07/17  Yes Kathrine Haddock, NP  promethazine (PHENERGAN) 25 MG tablet Take 1 tablet (25 mg total) by mouth every 8 (eight) hours as needed for nausea or vomiting. 03/24/17  Yes Kathrine Haddock, NP  tiotropium (SPIRIVA HANDIHALER) 18 MCG inhalation capsule Place 18 mcg into inhaler and inhale daily.   Yes [provider]  venlafaxine XR (EFFEXOR-XR) 150 MG 24 hr capsule TAKE 1 CAPSULE EVERY DAY 08/19/16  Yes Kathrine Haddock, NP  verapamil (CALAN-SR) 120 MG CR tablet Take 1 tablet (120 mg total) by mouth at bedtime. 04/13/17  Yes Kathrine Haddock, NP  ACCU-CHEK AVIVA PLUS test strip CHECK ONCE A DAY 01/07/17   Volney American, PA-C  ACCU-CHEK [facility] LANCETS lancets CHECK ONCE A DAY 01/07/17   Volney American, PA-C  fluconazole (DIFLUCAN) 150 MG tablet Take 1 tablet (150 mg total) by mouth daily. Patient not taking: Reported on 04/25/2017 12/15/16   Kathrine Haddock, NP   Allergies  Allergen Reactions  . Other Palpitations    IV steroids  . Pain Patch [Menthol] Anaphylaxis  . Avelox [Moxifloxacin Hcl In Nacl] Other (See Comments)    Upset stomach  . Erythromycin Nausea Only and Other (See Comments)    Can take a Z-Pak just fine  . Fentanyl   . Oxycontin [Oxycodone] Hives    FAMILY HISTORY:  Family History  Problem Relation Age of Onset  . Cancer Mother 80       lung  . Thyroid disease Mother   . Cancer Father 54       Pancreatic  . Hypertension Sister   . Cancer Sister        "cancer on face"  . Hyperlipidemia Sister   . Osteoporosis Maternal Grandmother   . Hyperlipidemia Son   . Seizures Son   . Cancer  Paternal Grandmother        colon  . Arthritis Paternal Grandfather   . Breast cancer Neg Hx    SOCIAL HISTORY:  reports that she quit smoking about 12 years ago. Her smoking use included Cigarettes. She has never used smokeless tobacco. She reports that she does not drink alcohol or use drugs.  REVIEW OF SYSTEMS:   Could not be obtained due to delirium.    VITAL SIGNS: Temp:  [96.4 F (35.8 C)] 96.4 F (35.8 C) (09/22 0610) Pulse Rate:  [29-144] 121 (09/22 1524) Resp:  [18-30] 30 (09/22 1524) BP: (105-158)/(68-110) 129/68 (09/22 1442) SpO2:  [95 %-100 %] 100 % (  09/22 1524) Weight:  [150 lb (68 kg)] 150 lb (68 kg) (09/22 0547) HEMODYNAMICS:   INTAKE / OUTPUT:  Intake/Output Summary (Last 24 hours) at 04/25/17 1657 Last data filed at 04/25/17 1341  Gross per 24 hour  Intake                0 ml  Output              280 ml  Net             -280 ml    Physical Examination:   VS: BP 129/68   Pulse (!) 121   Temp (!) 96.4 F (35.8 C) (Axillary)   Resp (!) 30   Wt 150 lb (68 kg)   LMP  (LMP Unknown)   SpO2 100%   BMI 30.30 kg/m   General Appearance: agitated.  Neuro:without focal findings, altered, agitated.  HEENT: PERRLA, EOM intact, no ptosis, Pulmonary: normal breath sounds., diaphragmatic excursion normal. CardiovascularNormal S1,S2.  No m/r/g.    Abdomen: Benign, Soft, non-tender, No masses, hepatosplenomegaly, No lymphadenopathy Renal:  No costovertebral tenderness  GU:  Not performed at this time. Endoc: No evident thyromegaly, no signs of acromegaly. Skin:   warm, no rashes, no ecchymosis  Extremities: normal, no cyanosis, clubbing, no edema, warm with normal capillary refill.    LABS: Reviewed   LABORATORY PANEL:   CBC  Recent Labs Lab 04/25/17 0820  WBC 18.3*  HGB 14.6  HCT 43.6  PLT 443*    Chemistries   Recent Labs Lab 04/25/17 0820  NA 138  K 3.8  CL 101  CO2 27  GLUCOSE 210*  BUN 11  CREATININE 0.63  CALCIUM 9.7  AST 25    ALT 16  ALKPHOS 61  BILITOT 0.6     Recent Labs Lab 04/25/17 0551  GLUCAP 230*   No results for input(s): PHART, PCO2ART, PO2ART in the last 168 hours.  Recent Labs Lab 04/25/17 0820  AST 25  ALT 16  ALKPHOS 61  BILITOT 0.6  ALBUMIN 4.2    Cardiac Enzymes  Recent Labs Lab 04/25/17 0820  TROPONINI <0.03    RADIOLOGY:  Dg Chest 2 View  Result Date: 04/25/2017 CLINICAL DATA:  History of breast cancer. EXAM: CHEST  2 VIEW COMPARISON:  March 02, 2017 FINDINGS: The heart size and mediastinal contours are within normal limits. Both lungs are clear. The visualized skeletal structures are unremarkable. IMPRESSION: No active cardiopulmonary disease. Electronically Signed   By: Dorise Bullion III M.D   On: 04/25/2017 11:35       --Marda Stalker, MD.  Board Certified in Internal Medicine, Pulmonary Medicine, [place], and Sleep Medicine.  ICU Pager [phone] [facility] Office Number: [phone]  [name] Pesa, M.D.  Merton Border, M.D   04/25/2017, 4:57 PM

## 2017-04-25 NOTE — ED Provider Notes (Addendum)
[facility] Emergency Department Provider Note ____________________________________________   I have reviewed the triage vital signs and the triage nursing note.  HISTORY  Chief Complaint Pain Pump Malfunction (breast cancer patient)   Historian Patient history limited by severe illness Most of history from daughter  HPI Jordan Chan is a 66 y.o. female with history of chronic pain, for which she has a morphine pain pump (followed and placed by [facility], Dr. Kandace Blitz) which started beeping last week and pt was seen at Pain clinic yesterday - Friday - and scheduled for battery replacement on this coming Wednesday.  States she was given a patch and points to her arm which has patch -- unclear what it is.    Shes had n/v all night with hot and cold chills, insomnia and restlessness.  Unable to take any po and is a diabetic.  Daughter feels patients symptoms are same as when she had pain pump morphine withdrawal a few years ago.  They called Pain clinic this AM at 4:30 and were instructed to come to the ER.    Past Medical History:  Diagnosis Date  . Arthritis   . Asthma   . Breast cancer ([place]) 1998   right breast ca with mastectomy and chemotherapy and radiation  . Cancer ([place]) 1998   masectomy  . COPD (chronic obstructive pulmonary disease) ([place])   . Diabetes mellitus without complication ([place])   . Endometriosis   . GERD (gastroesophageal reflux disease)   . IBS (irritable bowel syndrome)   . Sleep apnea     Patient Active Problem List   Diagnosis Date Noted  . Tachycardia 04/13/2017  . Pain in both hands 02/16/2017  . Pain involving joint of finger of left hand 12/15/2016  . Advanced care planning/counseling discussion 11/14/2016  . Anemia 11/04/2016  . Cervical scoliosis 05/20/2016  . Torticollis 04/28/2016  . Height loss 01/21/2016  . Nocturnal oxygen desaturation 10/01/2015  . Benign hypertension with  chronic kidney disease 06/18/2015  . B12 deficiency 03/19/2015  . Skin yeast infection 03/19/2015  . Sleep apnea 11/16/2014  . Anxiety 11/16/2014  . Allergic rhinitis 11/16/2014  . Malignant neoplasm of right breast ([place]) 11/16/2014  . GERD (gastroesophageal reflux disease) 11/16/2014  . COPD, moderate ([place]) 11/16/2014  . Obesity 11/16/2014  . Major depression 11/16/2014  . Insomnia 11/16/2014  . Chronic back pain 11/16/2014  . Diabetic neuropathy ([place]) 11/16/2014  . Diabetes mellitus with chronic kidney disease ([place]) 11/16/2014  . Chronic kidney disease, stage III (moderate) 11/16/2014  . Benign hypertensive renal disease 11/16/2014  . Hyperlipidemia 11/16/2014  . 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 Low back pain 09/30/2012  . Neuralgia of chest 09/30/2012  . Pain syndrome, chronic 09/30/2012    Past Surgical History:  Procedure Laterality Date  . ABDOMINAL HYSTERECTOMY  1987  . BACK SURGERY     Tailbone removed following fracture  . COLONOSCOPY WITH PROPOFOL N/A 01/01/2017   Procedure: COLONOSCOPY WITH PROPOFOL;  Surgeon: Jonathon Bellows, MD;  Location: [facility] ENDOSCOPY;  Service: Endoscopy;  Laterality: N/A;  . ELBOW ARTHROSCOPY WITH TENDON RECONSTRUCTION    . ESOPHAGOGASTRODUODENOSCOPY (EGD) WITH PROPOFOL N/A 01/01/2017   Procedure: ESOPHAGOGASTRODUODENOSCOPY (EGD) WITH PROPOFOL;  Surgeon: Jonathon Bellows, MD;  Location: [facility] ENDOSCOPY;  Service: Endoscopy;  Laterality: N/A;  . MASTECTOMY  06/1997  . morphine pump  2011  . PLANTAR FASCIA RELEASE      Prior to Admission medications   Medication Sig Start Date End Date  Taking? Authorizing Provider  ACCU-CHEK AVIVA PLUS test strip CHECK ONCE A DAY 01/07/17   Volney American, PA-C  ACCU-CHEK [facility] LANCETS lancets CHECK ONCE A DAY 01/07/17   Volney American, [place]  ADVAIR DISKUS 250-50 MCG/DOSE AEPB INHALE 1 PUFF TWICE DAILY 01/07/17   Kathrine Haddock, NP  albuterol (PROVENTIL HFA;VENTOLIN HFA) 108 (90 BASE)  MCG/ACT inhaler Inhale 2 puffs into the lungs every 6 (six) hours as needed for wheezing or shortness of breath.    [provider]  aspirin EC 81 MG tablet Take 81 mg by mouth daily.    [provider]  atorvastatin (LIPITOR) 20 MG tablet TAKE 1 TABLET EVERY DAY 01/09/17   Kathrine Haddock, NP  clonazePAM (KLONOPIN) 0.5 MG tablet Take 1 tablet (0.5 mg total) by mouth 2 (two) times daily as needed for anxiety. 04/20/17   Kathrine Haddock, NP  cyclobenzaprine (FLEXERIL) 5 MG tablet Take 5 mg by mouth 2 (two) times daily as needed. 02/26/17   [provider]  diclofenac (VOLTAREN) 50 MG EC tablet TAKE 1 TABLET THREE TIMES DAILY (SUBSTITUTED FOR VOLTAREN) 04/14/17   Kathrine Haddock, NP  Dulaglutide (TRULICITY) 4.00 QQ/7.6PP SOPN Inject 0.75 mg into the skin once a week. 03/17/17   Kathrine Haddock, NP  Ferrous Sulfate (SLOW FE PO) Take by mouth 2 (two) times daily.    [provider]  fluconazole (DIFLUCAN) 150 MG tablet Take 1 tablet (150 mg total) by mouth daily. 12/15/16   Kathrine Haddock, NP  gabapentin (NEURONTIN) 800 MG tablet Take 1 tablet (800 mg total) by mouth 3 (three) times daily. 01/12/17   Kathrine Haddock, NP  Iron-Vitamin C (IRON 100/C PO) Take 1 tablet by mouth 2 (two) times daily.    [provider]  LEVEMIR FLEXTOUCH 100 UNIT/ML Pen INJECT 42 UNITS SUBCUTANEOUSLY AT BEDTIME. Patient taking differently: INJECT 32 UNITS SUBCUTANEOUSLY AT BEDTIME. 04/08/16   Guadalupe Maple, MD  lisinopril (PRINIVIL,ZESTRIL) 5 MG tablet Take 1 tablet (5 mg total) by mouth daily. 07/14/16   Johnson, Megan P, DO  metFORMIN (GLUCOPHAGE-XR) 500 MG 24 hr tablet TAKE 2 TABLETS TWICE DAILY 01/07/17   Kathrine Haddock, NP  promethazine (PHENERGAN) 25 MG tablet Take 1 tablet (25 mg total) by mouth every 8 (eight) hours as needed for nausea or vomiting. 03/24/17   Kathrine Haddock, NP  tiotropium (SPIRIVA HANDIHALER) 18 MCG inhalation capsule Place 18 mcg into inhaler and inhale daily.     [provider]  venlafaxine XR (EFFEXOR-XR) 150 MG 24 hr capsule TAKE 1 CAPSULE EVERY DAY 08/19/16   Kathrine Haddock, NP  verapamil (CALAN-SR) 120 MG CR tablet Take 1 tablet (120 mg total) by mouth at bedtime. 04/13/17   Kathrine Haddock, NP    Allergies  Allergen Reactions  . Other Palpitations    IV steroids  . Pain Patch [Menthol] Anaphylaxis  . Avelox [Moxifloxacin Hcl In Nacl] Other (See Comments)    Upset stomach  . Erythromycin Nausea Only and Other (See Comments)    Can take a Z-Pak just fine  . Fentanyl   . Oxycontin [Oxycodone] Hives    Family History  Problem Relation Age of Onset  . Cancer Mother 65       lung  . Thyroid disease Mother   . Cancer Father 33       Pancreatic  . Hypertension Sister   . Cancer Sister        "cancer on face"  . Hyperlipidemia Sister   . Osteoporosis Maternal  Grandmother   . Hyperlipidemia Son   . Seizures Son   . Cancer Paternal Grandmother        colon  . Arthritis Paternal Grandfather   . Breast cancer Neg Hx     Social History Social History  Substance Use Topics  . Smoking status: Former Smoker    Types: Cigarettes    Quit date: 04/30/2004  . Smokeless tobacco: Never Used  . Alcohol use No    Review of Systems  Constitutional: Negative for fever, but hot/cold. Eyes: Negative for visual changes. ENT: Negative for sore throat. Cardiovascular: Negative for chest pain. Respiratory: Negative for shortness of breath. Gastrointestinal: Negative for diarrhea. Genitourinary: Negative for dysuria. Musculoskeletal: Negative for back pain. Skin: Negative for rash. Neurological: Negative for headache.  ____________________________________________   PHYSICAL EXAM:  VITAL SIGNS: ED Triage Vitals  Enc Vitals Group     BP 04/25/17 0547 (!) 158/78     Pulse Rate 04/25/17 0547 81     Resp 04/25/17 0610 19     Temp 04/25/17 0610 (!) 96.4 F (35.8 C)     Temp Source 04/25/17 0610 Axillary     SpO2 04/25/17 0547 100  %     Weight 04/25/17 0547 150 lb (68 kg)     Height --      Head Circumference --      Peak Flow --      Pain Score 04/25/17 0546 10     Pain Loc --      Pain Edu? --      Excl. in [place]? --      Constitutional: Alert and looks uncomfortable with dry heaving and dry mouth. [name] HEENT   Head: Normocephalic and atraumatic.      Eyes: Conjunctivae are normal. Pupils equal and round.       Ears:         Nose: No congestion/rhinnorhea.   Mouth/Throat: Mucous membranes are dry.   Neck: No stridor. Cardiovascular/Chest: Normal rate, regular rhythm.  No murmurs, rubs, or gallops. Respiratory: Normal respiratory effort without tachypnea nor retractions. Breath sounds are clear and equal bilaterally. No wheezes/rales/rhonchi. Gastrointestinal: Soft. No distention, no guarding, no rebound. Nontender.    Genitourinary/rectal:Deferred Musculoskeletal: Nontender with normal range of motion in all extremities. No joint effusions.  No lower extremity tenderness.  No edema. Neurologic:  Normal speech and language. No gross or focal neurologic deficits are appreciated. Skin:  Skin is warm, dry and intact. No rash noted. Psychiatric: Mood and affect are normal. Speech and behavior are normal. Patient exhibits appropriate insight and judgment.   ____________________________________________  LABS (pertinent positives/negatives) I, Lisa Roca, MD the attending physician have reviewed the labs noted below.  Labs Reviewed  GLUCOSE, CAPILLARY - Abnormal; Notable for the following:       Result Value   Glucose-Capillary 230 (*)    All other components within normal limits  COMPREHENSIVE METABOLIC PANEL - Abnormal; Notable for the following:    Glucose, Bld 210 (*)    Total Protein 8.8 (*)    All other components within normal limits  CBC WITH DIFFERENTIAL/PLATELET - Abnormal; Notable for the following:    WBC 18.3 (*)    RDW 15.3 (*)    Platelets 443 (*)    Neutro Abs 15.0 (*)     Monocytes Absolute 1.1 (*)    All other components within normal limits  URINALYSIS, COMPLETE (UACMP) WITH MICROSCOPIC - Abnormal; Notable for the following:    Color, Urine YELLOW (*)  APPearance CLEAR (*)    Glucose, UA 50 (*)    Hgb urine dipstick SMALL (*)    Ketones, ur 20 (*)    Protein, ur 30 (*)    All other components within normal limits  CULTURE, BLOOD (ROUTINE X 2)  CULTURE, BLOOD (ROUTINE X 2)  TROPONIN I    ____________________________________________    EKG I, Lisa Roca, MD, the attending physician have personally viewed and interpreted all ECGs.  92 bpm.  Sinus rhythm with sinus arrhythmia.  Narrow qrs.  Nl axis.  Nonspecific ST/T wave. ____________________________________________  RADIOLOGY All Xrays were viewed by me.  Imaging interpreted by Radiologist, and I, Lisa Roca, MD the attending physician have reviewed the radiologist interpretation noted below.  None __________________________________________  PROCEDURES  Procedure(s) performed: None  Critical Care performed: None  ____________________________________________   ED COURSE / ASSESSMENT AND PLAN  Pertinent labs & imaging results that were available during my care of the patient were reviewed by me and considered in my medical decision making (see chart for details).   I think Ms. Matthew's symptoms do seem most consistent with morphine withdrawal given known pump battery failure.  Will check for other causes of hot/cold, n/v including diabetic emergency, infectious emergency, intraabominal emergency and also complications of n/v such as dehydration and electrolyte disturbance.  She required multiple doses of pain and nausea medication for withdrawal symptoms.  No additional evidence for sepsis or infection.  after speaking with pain clinic physician, who was speaking to me through the surgical tech as she was in surgery, did not recommend emergency transfer and admit here if needed.  Plan would be to continue with outpatient procedure on Wednesday.    CONSULTATIONS:   [facility] physician, Dr. Hilbert Odor- recommends not need for emergency transfer - recommends if needs admission, admit here.  Hospitalist for admission.   Patient / Family / Caregiver informed of clinical course, medical decision-making process, and agree with plan.  addended, as I spoke with Dr. Doy Hutching, he requested further discussion with patient's pain physician. I spoke with Dr. Blane Ohara directly who indicated likely needs hospital admission for acute withdrawal n/v but does not need emergency transfer or emergency battery replacement - her physician will replace on Wednesday.  She offered to consult and guide hospitalist for withdrawal medications and is available by phone page the rest of the weekend.  We discussed trying to avoid IV morphine.  Keep on her 60mg  MS Contin BID, continue clonidine titration for symptoms.  Continue 12.5mg  phenergan IV q 3-4 hours.  Utilize small dose haldol (0.5 -1 mg) for withdrawal symptoms.  Hospitalist uncomfortable admitting here with no oncall Pain Management physician.  I will try for transfer to [facility].  Awaiting Hospitalist call back after discussion with [name], call center advocate.  3:35 - I spoke again with Dr. Doy Hutching regarding Dr. Duaine Dredge recommendations and availability this weekend by phone and he is agreeable to go ahead and admit the patient here.   ___________________________________________   FINAL CLINICAL IMPRESSION(S) / ED DIAGNOSES   Final diagnoses:  Narcotic withdrawal ([place])  Intractable vomiting with nausea, unspecified vomiting type              Note: This dictation was prepared with Dragon dictation. Any transcriptional errors that result from this process are unintentional    Lisa Roca, MD 04/25/17 1354    Lisa Roca, MD 04/25/17 1536

## 2017-04-26 ENCOUNTER — Encounter: Payer: Self-pay | Admitting: *Deleted

## 2017-04-26 LAB — COMPREHENSIVE METABOLIC PANEL
ALT: 13 U/L — ABNORMAL LOW (ref 14–54)
BUN: 11 mg/dL (ref 6–20)
Calcium: 8.4 mg/dL — ABNORMAL LOW (ref 8.9–10.3)
Chloride: 107 mmol/L (ref 101–111)
Creatinine, Ser: 0.7 mg/dL (ref 0.44–1.00)
GFR calc Af Amer: >60 mL/min (ref >60)
GFR calc non Af Amer: >60 mL/min (ref >60)
Glucose, Bld: 215 mg/dL — ABNORMAL HIGH (ref 65–99)
Total Bilirubin: 0.6 mg/dL (ref 0.3–1.2)
Total Protein: 7.3 g/dL (ref 6.5–8.1)

## 2017-04-26 LAB — COMPREHENSIVE METABOLIC PANEL WITH GFR
AST: 25 U/L (ref 15–41)
Albumin: 3.3 g/dL — ABNORMAL LOW (ref 3.5–5.0)
Alkaline Phosphatase: 49 U/L (ref 38–126)
Anion gap: 8 (ref 5–15)
CO2: 24 mmol/L (ref 22–32)
Potassium: 3.9 mmol/L (ref 3.5–5.1)
Sodium: 139 mmol/L (ref 135–145)

## 2017-04-26 LAB — CBC
HCT: 39.2 % (ref 35.0–47.0)
Hemoglobin: 13 g/dL (ref 12.0–16.0)
MCH: 29.4 pg (ref 26.0–34.0)
MCHC: 33.3 g/dL (ref 32.0–36.0)
MCV: 88.2 fL (ref 80.0–100.0)
Platelets: 422 10*3/uL (ref 150–440)
RBC: 4.44 MIL/uL (ref 3.80–5.20)
RDW: 15.8 % — ABNORMAL HIGH (ref 11.5–14.5)
WBC: 20.8 10*3/uL — ABNORMAL HIGH (ref 3.6–11.0)

## 2017-04-26 LAB — GLUCOSE, CAPILLARY
Glucose-Capillary: 196 mg/dL — ABNORMAL HIGH (ref 65–99)
Glucose-Capillary: 166 mg/dL — ABNORMAL HIGH (ref 65–99)
Glucose-Capillary: 160 mg/dL — ABNORMAL HIGH (ref 65–99)

## 2017-04-26 MED ORDER — METHYLPREDNISOLONE SODIUM SUCC 125 MG IJ SOLR
60.0000 mg | Freq: Two times a day (BID) | INTRAMUSCULAR | Status: DC
  Administered 2017-04-27: 60 mg via INTRAVENOUS
  Filled 2017-04-26: qty 2

## 2017-04-26 MED ORDER — METHYLPREDNISOLONE SODIUM SUCC 125 MG IJ SOLR
125.0000 mg | Freq: Once | INTRAMUSCULAR | Status: AC
  Administered 2017-04-26: 125 mg via INTRAVENOUS
  Filled 2017-04-26: qty 2

## 2017-04-26 MED ORDER — MORPHINE SULFATE (PF) 4 MG/ML IV SOLN
4.0000 mg | Freq: Once | INTRAVENOUS | Status: AC
  Administered 2017-04-26: 4 mg via INTRAVENOUS
  Filled 2017-04-26: qty 1

## 2017-04-26 NOTE — Progress Notes (Signed)
[facility] NAME: Jordan Chan    MR#:  [id number]  DATE OF BIRTH:  10/08/1950  SUBJECTIVE:  CHIEF COMPLAINT:   Chief Complaint  Patient presents with  . Pain Pump Malfunction    breast cancer patient    on morphine pump, have malfunction of pump for 2-3 days and came with severe withdrawal symptoms. On precedex drip.  REVIEW OF SYSTEMS:  Pt is sedated. ROS  DRUG ALLERGIES:   Allergies  Allergen Reactions  . Other Palpitations    IV steroids  . Pain Patch [Menthol] Anaphylaxis  . Avelox [Moxifloxacin Hcl In Nacl] Other (See Comments)    Upset stomach  . Erythromycin Nausea Only and Other (See Comments)    Can take a Z-Pak just fine  . Fentanyl   . Oxycontin [Oxycodone] Hives    VITALS:  Blood pressure (!) 94/46, pulse 71, temperature 98.4 F (36.9 C), temperature source Axillary, resp. rate (!) 26, height 4\' 10"  (1.473 m), weight 72 kg (158 lb 11.7 oz), SpO2 100 %.  PHYSICAL EXAMINATION:  GENERAL:  66 y.o.-year-old patient lying in the bed with no acute distress.  EYES: Pupils equal, round, reactive to light and accommodation. No scleral icterus. HEENT: Head atraumatic, normocephalic. Oropharynx and nasopharynx clear.  NECK:  Supple, no jugular venous distention. No thyroid enlargement, no tenderness.  LUNGS: Normal breath sounds bilaterally, no wheezing, rales,rhonchi or crepitation. No use of accessory muscles of respiration.  CARDIOVASCULAR: S1, S2 normal. No murmurs, rubs, or gallops.  ABDOMEN: Soft, nontender, nondistended. Bowel sounds present. No organomegaly or mass.  EXTREMITIES: No pedal edema, cyanosis, or clubbing.  NEUROLOGIC: pt is sedated with precedex drip, so not able to check.  PSYCHIATRIC: The patient is sedated.  SKIN: No obvious rash, lesion, or ulcer.   Physical Exam LABORATORY PANEL:   CBC  Recent Labs Lab 04/26/17 0505  WBC 20.8*  HGB 13.0  HCT 39.2  PLT 422    ------------------------------------------------------------------------------------------------------------------  Chemistries   Recent Labs Lab 04/26/17 0505  NA 139  K 3.9  CL 107  CO2 24  GLUCOSE 215*  BUN 11  CREATININE 0.70  CALCIUM 8.4*  AST 25  ALT 13*  ALKPHOS 49  BILITOT 0.6   ------------------------------------------------------------------------------------------------------------------  Cardiac Enzymes  Recent Labs Lab 04/25/17 0820  TROPONINI <0.03   ------------------------------------------------------------------------------------------------------------------  RADIOLOGY:  Dg Chest 2 View  Result Date: 04/25/2017 CLINICAL DATA:  History of breast cancer. EXAM: CHEST  2 VIEW COMPARISON:  March 02, 2017 FINDINGS: The heart size and mediastinal contours are within normal limits. Both lungs are clear. The visualized skeletal structures are unremarkable. IMPRESSION: No active cardiopulmonary disease. Electronically Signed   By: Dorise Bullion III M.D   On: 04/25/2017 11:35    ASSESSMENT AND PLAN:   Principal Problem:   Opioid withdrawal ([place]) Active Problems:   Chronic back pain   Intractable nausea and vomiting   Acute encephalopathy   Opiate withdrawal (HCC)  * severe opioid withdrawal   IV precedex drip.   Manage per intensivist  * opioid pump malfunction   For last 10 years on morphin pump.   Pain management clinic consult.  * COPD    No exacerbation    Cont Inhalers and nebs.  * Hypertension   Hold meds due to hypotension  * DM    As due to sedation, No oral intake.   Keep on ISS, no long acting insuline now.     All the  records are reviewed and case discussed with Care Management/Social Workerr. Management plans discussed with the patient, family and they are in agreement.  CODE STATUS: Full.  TOTAL TIME TAKING CARE OF THIS PATIENT: 35 minutes.     POSSIBLE D/C IN 2-3 DAYS, DEPENDING ON CLINICAL  CONDITION.   Vaughan Basta M.D on 04/26/2017   Between 7am to 6pm - Pager - [phone]  After 6pm go to [email or web address] - password [email or web address] [facility]  Office  [phone]  CC: Primary care physician; Kathrine Haddock, NP  Note: This dictation was prepared with Dragon dictation along with smaller phrase technology. Any transcriptional errors that result from this process are unintentional.

## 2017-04-26 NOTE — Progress Notes (Signed)
Patient was able to sleep most of the night once Precedex back in system. Family remained at bedside.

## 2017-04-26 NOTE — Consult Note (Signed)
[facility] Consultation    SYNOPSIS   66 yo female on morphine pump, whose battery on the pump died, she has been experiencing severe opioid withdrawal symptoms.   ASSESSMENT/PLAN   Severe opioid withdrawal.  Severe agitation, delirium.  Essential hypertension, DM, COPD, OSA. Lung nodules.   --Continue precedex, wean as tolerated.  --Anti-HTN as needed.  --Levemir, SSI.  --DVT prophylaxis.   INTAKE / OUTPUT:  Intake/Output Summary (Last 24 hours) at 04/26/17 1004 Last data filed at 04/26/17 0600  Gross per 24 hour  Intake           2105.8 ml  Output             1230 ml  Net            875.8 ml      MAJOR EVENTS/TEST RESULTS: --Admitted 9/22 with opioid withdrawal.   Best Practices  DVT Prophylaxis: heparin GI Prophylaxis: --  ---------------------------------------  ---------------------------------------   Name: Jordan Chan MRN: [id number] DOB: 05/13/1951    ADMISSION DATE:  04/25/2017  hpi The patient is feeling well, he no new complaints today.  Prior to Admission medications   Medication Sig Start Date End Date Taking? Authorizing Provider  ADVAIR DISKUS 250-50 MCG/DOSE AEPB INHALE 1 PUFF TWICE DAILY 01/07/17  Yes Kathrine Haddock, NP  albuterol (PROVENTIL HFA;VENTOLIN HFA) 108 (90 BASE) MCG/ACT inhaler Inhale 2 puffs into the lungs every 6 (six) hours as needed for wheezing or shortness of breath.   Yes [provider]  aspirin EC 81 MG tablet Take 81 mg by mouth daily.   Yes [provider]  atorvastatin (LIPITOR) 20 MG tablet TAKE 1 TABLET EVERY DAY 01/09/17  Yes Kathrine Haddock, NP  clonazePAM (KLONOPIN) 0.5 MG tablet Take 1 tablet (0.5 mg total) by mouth 2 (two) times daily as needed for anxiety. 04/20/17  Yes Kathrine Haddock, NP  cyclobenzaprine (FLEXERIL) 5 MG tablet Take 5 mg by mouth 2 (two) times daily as needed. 02/26/17  Yes [provider]  diclofenac (VOLTAREN) 50 MG EC tablet TAKE 1 TABLET  THREE TIMES DAILY (SUBSTITUTED FOR VOLTAREN) 04/14/17  Yes Kathrine Haddock, NP  Dulaglutide (TRULICITY) 1.76 HY/0.7PX SOPN Inject 0.75 mg into the skin once a week. 03/17/17  Yes Kathrine Haddock, NP  gabapentin (NEURONTIN) 800 MG tablet Take 1 tablet (800 mg total) by mouth 3 (three) times daily. 01/12/17  Yes Kathrine Haddock, NP  LEVEMIR FLEXTOUCH 100 UNIT/ML Pen INJECT 42 UNITS SUBCUTANEOUSLY AT BEDTIME. Patient taking differently: INJECT 32 UNITS SUBCUTANEOUSLY AT BEDTIME. 04/08/16  Yes Crissman, Jeannette How, MD  lisinopril (PRINIVIL,ZESTRIL) 5 MG tablet Take 1 tablet (5 mg total) by mouth daily. 07/14/16  Yes Johnson, Megan P, DO  metFORMIN (GLUCOPHAGE-XR) 500 MG 24 hr tablet TAKE 2 TABLETS TWICE DAILY 01/07/17  Yes Kathrine Haddock, NP  promethazine (PHENERGAN) 25 MG tablet Take 1 tablet (25 mg total) by mouth every 8 (eight) hours as needed for nausea or vomiting. 03/24/17  Yes Kathrine Haddock, NP  tiotropium (SPIRIVA HANDIHALER) 18 MCG inhalation capsule Place 18 mcg into inhaler and inhale daily.   Yes [provider]  venlafaxine XR (EFFEXOR-XR) 150 MG 24 hr capsule TAKE 1 CAPSULE EVERY DAY 08/19/16  Yes Kathrine Haddock, NP  verapamil (CALAN-SR) 120 MG CR tablet Take 1 tablet (120 mg total) by mouth at bedtime. 04/13/17  Yes Kathrine Haddock, NP  ACCU-CHEK AVIVA PLUS test strip CHECK ONCE A DAY 01/07/17   Volney American, PA-C  ACCU-CHEK  SOFTCLIX LANCETS lancets CHECK ONCE A DAY 01/07/17   Volney American, PA-C  fluconazole (DIFLUCAN) 150 MG tablet Take 1 tablet (150 mg total) by mouth daily. Patient not taking: Reported on 04/25/2017 12/15/16   Kathrine Haddock, NP   Allergies  Allergen Reactions  . Other Palpitations    IV steroids  . Pain Patch [Menthol] Anaphylaxis  . Avelox [Moxifloxacin Hcl In Nacl] Other (See Comments)    Upset stomach  . Erythromycin Nausea Only and Other (See Comments)    Can take a Z-Pak just fine  . Fentanyl   . Oxycontin [Oxycodone] Hives   REVIEW OF SYSTEMS:    Pt denies chest pain, PND, orthopnea. The remainder of the review of systems was reviewed with this patient and was found to be negative.   VITAL SIGNS: Temp:  [97.5 F (36.4 C)-99.5 F (37.5 C)] 99.5 F (37.5 C) (09/23 0400) Pulse Rate:  [32-152] 85 (09/23 0630) Resp:  [16-38] 24 (09/23 0630) BP: (63-133)/(38-105) 81/45 (09/23 0630) SpO2:  [82 %-100 %] 100 % (09/23 0630) FiO2 (%):  [28 %] 28 % (09/22 1616) Weight:  [151 lb 10.8 oz (68.8 kg)-158 lb 11.7 oz (72 kg)] 158 lb 11.7 oz (72 kg) (09/23 0500) HEMODYNAMICS:   INTAKE / OUTPUT:  Intake/Output Summary (Last 24 hours) at 04/26/17 1004 Last data filed at 04/26/17 0600  Gross per 24 hour  Intake           2105.8 ml  Output             1230 ml  Net            875.8 ml    Physical Examination:   VS: BP (!) 81/45   Pulse 85   Temp 99.5 F (37.5 C) (Oral)   Resp (!) 24   Ht 4\' 10"  (1.473 m)   Wt 158 lb 11.7 oz (72 kg)   LMP  (LMP Unknown)   SpO2 100%   BMI 33.17 kg/m   General Appearance: in good spirits.  Neuro:without focal findings, altered, agitated.  HEENT: PERRLA, EOM intact, no ptosis, Pulmonary: normal breath sounds., diaphragmatic excursion normal. CardiovascularNormal S1,S2.  No m/r/g.    Abdomen: Benign, Soft, non-tender, No masses, hepatosplenomegaly, No lymphadenopathy Renal:  No costovertebral tenderness  GU:  Not performed at this time. Endoc: No evident thyromegaly, no signs of acromegaly. Skin:   warm, no rashes, no ecchymosis  Extremities: normal, no cyanosis, clubbing, no edema, warm with normal capillary refill.    LABS: Reviewed   LABORATORY PANEL:   CBC  Recent Labs Lab 04/26/17 0505  WBC 20.8*  HGB 13.0  HCT 39.2  PLT 422    Chemistries   Recent Labs Lab 04/26/17 0505  NA 139  K 3.9  CL 107  CO2 24  GLUCOSE 215*  BUN 11  CREATININE 0.70  CALCIUM 8.4*  AST 25  ALT 13*  ALKPHOS 49  BILITOT 0.6     Recent Labs Lab 04/25/17 0551 04/25/17 1718 04/25/17 2157  04/26/17 0808  GLUCAP 230* 213* 156* 196*   No results for input(s): PHART, PCO2ART, PO2ART in the last 168 hours.  Recent Labs Lab 04/25/17 0820 04/26/17 0505  AST 25 25  ALT 16 13*  ALKPHOS 61 49  BILITOT 0.6 0.6  ALBUMIN 4.2 3.3*    Cardiac Enzymes  Recent Labs Lab 04/25/17 0820  TROPONINI <0.03    RADIOLOGY:  Dg Chest 2 View  Result Date: 04/25/2017 CLINICAL DATA:  History  of breast cancer. EXAM: CHEST  2 VIEW COMPARISON:  March 02, 2017 FINDINGS: The heart size and mediastinal contours are within normal limits. Both lungs are clear. The visualized skeletal structures are unremarkable. IMPRESSION: No active cardiopulmonary disease. Electronically Signed   By: Dorise Bullion III M.D   On: 04/25/2017 11:35       --Marda Stalker, MD.  Board Certified in Internal Medicine, Pulmonary Medicine, [place], and Sleep Medicine.  ICU Pager [phone] [facility] Office Number: [phone]  [name] Pesa, M.D.  Merton Border, M.D   04/26/2017, 10:04 AM

## 2017-04-27 ENCOUNTER — Encounter: Payer: Self-pay | Admitting: Pain Medicine

## 2017-04-27 ENCOUNTER — Inpatient Hospital Stay: Admit: 2017-04-27 | Payer: Medicare HMO

## 2017-04-27 ENCOUNTER — Telehealth: Payer: Self-pay

## 2017-04-27 ENCOUNTER — Encounter: Payer: Self-pay | Admitting: Nurse Practitioner

## 2017-04-27 DIAGNOSIS — G9340 Encephalopathy, unspecified: Secondary | ICD-10-CM

## 2017-04-27 DIAGNOSIS — F1123 Opioid dependence with withdrawal: Secondary | ICD-10-CM

## 2017-04-27 DIAGNOSIS — Z978 Presence of other specified devices: Secondary | ICD-10-CM | POA: Insufficient documentation

## 2017-04-27 DIAGNOSIS — I214 Non-ST elevation (NSTEMI) myocardial infarction: Secondary | ICD-10-CM

## 2017-04-27 DIAGNOSIS — G894 Chronic pain syndrome: Secondary | ICD-10-CM

## 2017-04-27 DIAGNOSIS — Z462 Encounter for fitting and adjustment of other devices related to nervous system and special senses: Secondary | ICD-10-CM | POA: Insufficient documentation

## 2017-04-27 DIAGNOSIS — F1193 Opioid use, unspecified with withdrawal: Secondary | ICD-10-CM

## 2017-04-27 DIAGNOSIS — R112 Nausea with vomiting, unspecified: Secondary | ICD-10-CM

## 2017-04-27 LAB — TROPONIN I
Troponin I: 0.39 ng/mL (ref <0.03)
Troponin I: 0.32 ng/mL (ref <0.03)
Troponin I: 0.3 ng/mL (ref <0.03)

## 2017-04-27 LAB — GLUCOSE, CAPILLARY
Glucose-Capillary: 268 mg/dL — ABNORMAL HIGH (ref 65–99)
Glucose-Capillary: 312 mg/dL — ABNORMAL HIGH (ref 65–99)
Glucose-Capillary: 321 mg/dL — ABNORMAL HIGH (ref 65–99)
Glucose-Capillary: 290 mg/dL — ABNORMAL HIGH (ref 65–99)
Glucose-Capillary: 189 mg/dL — ABNORMAL HIGH (ref 65–99)

## 2017-04-27 LAB — BASIC METABOLIC PANEL WITH GFR
BUN: 13 mg/dL (ref 6–20)
CO2: 21 mmol/L — ABNORMAL LOW (ref 22–32)
Calcium: 7.6 mg/dL — ABNORMAL LOW (ref 8.9–10.3)
Creatinine, Ser: 0.54 mg/dL (ref 0.44–1.00)
GFR calc non Af Amer: >60 mL/min (ref >60)
Glucose, Bld: 269 mg/dL — ABNORMAL HIGH (ref 65–99)

## 2017-04-27 LAB — CBC
HCT: 34 % — ABNORMAL LOW (ref 35.0–47.0)
Hemoglobin: 11.5 g/dL — ABNORMAL LOW (ref 12.0–16.0)
MCH: 30 pg (ref 26.0–34.0)
MCHC: 33.9 g/dL (ref 32.0–36.0)
MCV: 88.6 fL (ref 80.0–100.0)
Platelets: 333 10*3/uL (ref 150–440)
RBC: 3.84 MIL/uL (ref 3.80–5.20)
RDW: 15.8 % — ABNORMAL HIGH (ref 11.5–14.5)
WBC: 15.6 10*3/uL — ABNORMAL HIGH (ref 3.6–11.0)

## 2017-04-27 LAB — BASIC METABOLIC PANEL
Anion gap: 8 (ref 5–15)
Chloride: 111 mmol/L (ref 101–111)
GFR calc Af Amer: >60 mL/min (ref >60)
Potassium: 4 mmol/L (ref 3.5–5.1)
Sodium: 140 mmol/L (ref 135–145)

## 2017-04-27 LAB — HEPARIN LEVEL (UNFRACTIONATED): Heparin Unfractionated: 0.31 IU/mL (ref 0.30–0.70)

## 2017-04-27 MED ORDER — MORPHINE SULFATE 2 MG/ML IV SOLN: INTRAVENOUS | Status: DC

## 2017-04-27 MED ORDER — LORAZEPAM 2 MG/ML IJ SOLN
0.5000 mg | Freq: Once | INTRAMUSCULAR | Status: AC
  Administered 2017-04-27: 0.5 mg via INTRAVENOUS
  Filled 2017-04-27: qty 1

## 2017-04-27 MED ORDER — SODIUM CHLORIDE 0.9 % IV SOLN
INTRAVENOUS | Status: DC
  Administered 2017-04-28: 06:00:00 via INTRAVENOUS

## 2017-04-27 MED ORDER — INSULIN DETEMIR 100 UNIT/ML ~~LOC~~ SOLN
10.0000 [IU] | Freq: Every day | SUBCUTANEOUS | Status: DC
  Administered 2017-04-27 – 2017-05-01 (×4): 10 [IU] via SUBCUTANEOUS
  Filled 2017-04-27 (×6): qty 0.1

## 2017-04-27 MED ORDER — SODIUM CHLORIDE 0.9% FLUSH: 9.0000 mL | INTRAVENOUS | Status: DC | PRN

## 2017-04-27 MED ORDER — HEPARIN (PORCINE) IN NACL 100-0.45 UNIT/ML-% IJ SOLN
750.0000 [IU]/h | INTRAMUSCULAR | Status: DC
  Administered 2017-04-27: 700 [IU]/h via INTRAVENOUS
  Filled 2017-04-27: qty 250

## 2017-04-27 MED ORDER — NITROGLYCERIN 0.4 MG SL SUBL
0.4000 mg | SUBLINGUAL_TABLET | SUBLINGUAL | Status: DC | PRN
  Administered 2017-04-28 (×2): 0.4 mg via SUBLINGUAL
  Filled 2017-04-27: qty 1

## 2017-04-27 MED ORDER — DIPHENHYDRAMINE HCL 12.5 MG/5ML PO ELIX
12.5000 mg | ORAL_SOLUTION | Freq: Four times a day (QID) | ORAL | Status: DC | PRN
  Filled 2017-04-27: qty 5

## 2017-04-27 MED ORDER — DIPHENHYDRAMINE HCL 50 MG/ML IJ SOLN
12.5000 mg | Freq: Four times a day (QID) | INTRAMUSCULAR | Status: DC | PRN
  Administered 2017-04-28: 12.5 mg via INTRAVENOUS

## 2017-04-27 MED ORDER — ATORVASTATIN CALCIUM 20 MG PO TABS
80.0000 mg | ORAL_TABLET | Freq: Every day | ORAL | Status: DC
  Administered 2017-04-27 – 2017-04-29 (×3): 80 mg via ORAL
  Filled 2017-04-27 (×3): qty 4

## 2017-04-27 MED ORDER — SODIUM CHLORIDE 0.9% FLUSH
3.0000 mL | Freq: Two times a day (BID) | INTRAVENOUS | Status: DC
  Administered 2017-04-27: 3 mL via INTRAVENOUS

## 2017-04-27 MED ORDER — MIDAZOLAM HCL 2 MG/2ML IJ SOLN
INTRAMUSCULAR | Status: AC
  Administered 2017-04-27: 2 mg via INTRAVENOUS
  Filled 2017-04-27: qty 2

## 2017-04-27 MED ORDER — SODIUM CHLORIDE 0.9 % IV SOLN: 250.0000 mL | INTRAVENOUS | Status: DC | PRN

## 2017-04-27 MED ORDER — NALOXONE HCL 0.4 MG/ML IJ SOLN: 0.4000 mg | INTRAMUSCULAR | Status: DC | PRN

## 2017-04-27 MED ORDER — HYDROMORPHONE HCL 1 MG/ML IJ SOLN
4.0000 mg | Freq: Once | INTRAMUSCULAR | Status: AC
  Administered 2017-04-27: 4 mg via INTRAVENOUS
  Filled 2017-04-27: qty 4

## 2017-04-27 MED ORDER — ALPRAZOLAM 0.5 MG PO TABS
0.5000 mg | ORAL_TABLET | Freq: Three times a day (TID) | ORAL | Status: DC
  Administered 2017-04-27 – 2017-05-01 (×12): 0.5 mg via ORAL
  Filled 2017-04-27 (×12): qty 1

## 2017-04-27 MED ORDER — SODIUM CHLORIDE 0.9% FLUSH
3.0000 mL | INTRAVENOUS | Status: DC | PRN
  Administered 2017-04-27: 3 mL via INTRAVENOUS
  Filled 2017-04-27: qty 3

## 2017-04-27 MED ORDER — MORPHINE SULFATE (PF) 2 MG/ML IV SOLN
2.0000 mg | INTRAVENOUS | Status: DC | PRN
  Administered 2017-04-27: 2 mg via INTRAVENOUS
  Filled 2017-04-27: qty 1

## 2017-04-27 MED ORDER — PANTOPRAZOLE SODIUM 40 MG PO TBEC
40.0000 mg | DELAYED_RELEASE_TABLET | Freq: Two times a day (BID) | ORAL | Status: DC
  Administered 2017-04-27 – 2017-05-01 (×8): 40 mg via ORAL
  Filled 2017-04-27 (×8): qty 1

## 2017-04-27 MED ORDER — MORPHINE SULFATE 2 MG/ML IV SOLN
INTRAVENOUS | Status: DC
  Administered 2017-04-27: 3 mg via INTRAVENOUS
  Administered 2017-04-28: 2.52 mL via INTRAVENOUS
  Administered 2017-04-28: 04:00:00 via INTRAVENOUS
  Administered 2017-04-28: 3.3 mL via INTRAVENOUS
  Administered 2017-04-28: 10.9 mg via INTRAVENOUS
  Administered 2017-04-28: 16:00:00 via INTRAVENOUS
  Administered 2017-04-29 (×2): 2 mg via INTRAVENOUS
  Administered 2017-04-29: 4.16 mL via INTRAVENOUS
  Filled 2017-04-27 (×3): qty 30

## 2017-04-27 MED ORDER — MIDAZOLAM HCL 2 MG/2ML IJ SOLN
2.0000 mg | Freq: Once | INTRAMUSCULAR | Status: AC
  Administered 2017-04-27: 2 mg via INTRAVENOUS

## 2017-04-27 NOTE — Progress Notes (Signed)
Notified MD Kasa that patient was acting confused and did not feel PCA was appropriate at this time. Will continue to monitor patient.

## 2017-04-27 NOTE — Progress Notes (Signed)
Attempted to call MD Kasa of troponin results, no response or call back.  Called MD Vachhani of troponin results, new orders placed, will continue to monitor patient.

## 2017-04-27 NOTE — Progress Notes (Signed)
ANTICOAGULATION CONSULT NOTE - Initial Consult  Pharmacy Consult for Heparin Indication: ACS/STEMI  Allergies  Allergen Reactions  . Other Palpitations    IV steroids  . Pain Patch [Menthol] Anaphylaxis  . Avelox [Moxifloxacin Hcl In Nacl] Other (See Comments)    Upset stomach  . Erythromycin Nausea Only and Other (See Comments)    Can take a Z-Pak just fine  . Fentanyl   . Oxycontin [Oxycodone] Hives    Patient Measurements: Height: 4\' 10"  (147.3 cm) Weight: 158 lb 11.7 oz (72 kg) IBW/kg (Calculated) : 40.9 Heparin Dosing Weight: 56.4  Vital Signs: Temp: 98 F (36.7 C) (09/24 0720) Temp Source: Axillary (09/24 0720) BP: 105/54 (09/24 1400) Pulse Rate: 55 (09/24 1400)  Labs:  Recent Labs  04/25/17 0820 04/26/17 0505 04/27/17 0405 04/27/17 1341  HGB 14.6 13.0 11.5*  --   HCT 43.6 39.2 34.0*  --   PLT 443* 422 333  --   CREATININE 0.63 0.70 0.54  --   TROPONINI <0.03  --   --  0.39*    Estimated Creatinine Clearance: 57.4 mL/min (by C-G formula based on SCr of 0.54 mg/dL).   Medical History: Past Medical History:  Diagnosis Date  . Arthritis   . Asthma   . Breast cancer ([place]) 1998   right breast ca with mastectomy and chemotherapy and radiation  . Cancer ([place]) 1998   masectomy  . COPD (chronic obstructive pulmonary disease) ([place])   . Diabetes mellitus without complication ([place])   . Endometriosis   . GERD (gastroesophageal reflux disease)   . IBS (irritable bowel syndrome)   . Sleep apnea     Medications:  9/22 Heparin 5000U >> 9/24 Last SubQ Heparin 1439  Assessment: DM is a 27 YOF has a past medical history significant for DM, COPD, and chronic back pain with intrathecal pump followed by [facility] now with N/V, confusion and worsening pain. She was tachycardic and hypotensive in the ER. The nurse noticed a difference in her EKG and a troponin was collected and found to be 0.39. Patient previously getting heparin 5000U SubQ.   Goal  of Therapy:  Heparin level 0.3-0.7 units/ml Monitor platelets by anticoagulation protocol: Yes   Plan:  Start heparin infusion at 700 units/hr  No bolus due to SubQ Heparin administration at 1439 Will obtain Heparin level in 6 hours   Durwin Reges, PharmD Student 04/27/2017,3:05 PM

## 2017-04-27 NOTE — Progress Notes (Signed)
Patient had c/o pain this shift. Uncomfortable. PRN meds given with some relief. Husband in room at bedside. Will monitor.

## 2017-04-27 NOTE — Progress Notes (Signed)
Chaplain was making rounds and visited with pt in [place]. Chaplain provided [employer] of prayer and a pastoral presence. Chaplain is available for followup as needed.     04/27/17 1305  Clinical Encounter Type  Visited With Patient  Visit Type Initial;Spiritual support  Referral From Nurse  Consult/Referral To Chaplain  Spiritual Encounters  Spiritual Needs Prayer

## 2017-04-27 NOTE — Progress Notes (Signed)
Inpatient Diabetes Program Recommendations  AACE/ADA: New Consensus Statement on Inpatient Glycemic Control (2015)  Target Ranges:  Prepandial:   less than 140 mg/dL      Peak postprandial:   less than 180 mg/dL (1-2 hours)      Critically ill patients:  140 - 180 mg/dL   Results for Jordan Chan, Jordan Chan (MRN [id number]) as of 04/27/2017 11:26  Ref. Range 04/26/2017 08:08 04/26/2017 11:42 04/26/2017 17:27  Glucose-Capillary Latest Ref Range: 65 - 99 mg/dL 196 (H) 166 (H) 160 (H)   Results for Jordan Chan, Jordan Chan (MRN [id number]) as of 04/27/2017 11:26  Ref. Range 04/27/2017 07:24  Glucose-Capillary Latest Ref Range: 65 - 99 mg/dL 268 (H)    Admit with: Opioid Withdrawal from Pain Pump Failure  History: DM  Home DM Meds: Levemir 32 units QHS       Metformin 1000 mg BID       Trulicity 7.82 mg Qweek  Current Insulin Orders: Novolog Sensitive Correction Scale/ SSI (0-9 units) TID AC       MD- Note patient received 125 mg Solumedrol X 1 dose last night at 11pm and another 60 mg Soluemdrol this morning at 10am.  No more steroids ordered at present.  Note that fasting glucose elevated this AM likely due to steroids on board.    May consider starting a small portion of patient's home dose of Levemir:  Recommend Levemir 10 units daily to start (1/3 total home dose)     --Will follow patient during hospitalization--  Wyn Quaker RN, MSN, CDE Diabetes Coordinator Inpatient Glycemic Control Team Team Pager: [phone] (8a-5p)

## 2017-04-27 NOTE — Telephone Encounter (Signed)
Addendum to the 04/24/17 telephone message- patient requested to have appointment cancelled for 04/29/17. Appointment cancelled out per patient request.

## 2017-04-27 NOTE — Progress Notes (Signed)
Patient's Name: Jordan Chan  MRN: [id number]  Referring Provider: No ref. provider found  DOB: 05/13/1951  PCP: Jordan Haddock, NP  DOS: 04/27/2017  Note by: Gaspar Cola, MD  Service setting: Hospital Consult  Specialty: Interventional Pain Management  Location: [facility]    Patient type: New Patient   Primary Reason(s) for Visit: Chronic Pain Management in-patient Hospital Consult (Level of risk: moderate) CC: ITP Malfunction  HPI  Jordan Chan is a 66 y.o. year old, female patient, who comes today for an initial evaluation. She has Sleep apnea; Anxiety; Allergic rhinitis; Malignant neoplasm of right breast ([place]); GERD (gastroesophageal reflux disease); COPD, moderate ([place]); Obesity; Major depression; Insomnia; Chronic back pain; Diabetic neuropathy ([place]); Diabetes mellitus with chronic kidney disease ([place]); Chronic kidney disease, stage III (moderate); Benign hypertensive renal disease; Hyperlipidemia; B12 deficiency; Skin yeast infection; Benign hypertension with chronic kidney disease; Height loss; Torticollis; Cervical scoliosis; Anemia; Advanced care planning/counseling discussion; Low back pain; Neuralgia of chest; Nocturnal oxygen desaturation; Pain syndrome, chronic; Sarcoma, 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   The patient was apparently hospitalized in the intensive care unit due to withdrawals from an intrathecal pump. Today we reviewed the information from the pump and as it turns out, the patient had been informed that the pump was not working and she is on the schedule at [facility] to have the pump replaced on Wednesday.   Laboratory Chemistry  Inflammation Markers (CRP: Acute  Phase) (ESR: Chronic Phase) Lab Results  Component Value Date   ESRSEDRATE 7 02/16/2017                 Renal Function Markers Lab Results  Component Value Date   BUN 13 04/27/2017   CREATININE 0.54 04/27/2017   GFRAA >60 04/27/2017   GFRNONAA >60 04/27/2017                 Hepatic Function Markers Lab Results  Component Value Date   AST 25 04/26/2017   ALT 13 (L) 04/26/2017   ALBUMIN 3.3 (L) 04/26/2017   ALKPHOS 49 04/26/2017                 Electrolytes Lab Results  Component Value Date   NA 140 04/27/2017   K 4.0 04/27/2017   CL 111 04/27/2017   CALCIUM 7.6 (L) 04/27/2017                 Neuropathy Markers Lab Results  Component Value Date   VITAMINB12 385 11/03/2016                 Bone Pathology Markers Lab Results  Component Value Date   ALKPHOS 49 04/26/2017   CALCIUM 7.6 (L) 04/27/2017                 Coagulation Parameters Lab Results  Component Value Date   PLT 333 04/27/2017                 Cardiovascular Markers Lab Results  Component Value Date   BNP 1,171 (H) 09/14/2013   HGB 11.5 (L) 04/27/2017   HCT 34.0 (L) 04/27/2017                 Note: Lab results reviewed.  Meds  No current facility-administered medications for this visit.  No current outpatient prescriptions on file.  Facility-Administered Medications Ordered in Other Visits:  .  0.9 %  sodium chloride infusion, , Intravenous, Continuous, Idelle Crouch, MD, Last Rate: 125 mL/hr at 04/27/17 1438 .  acetaminophen (TYLENOL) tablet 650 mg, 650 mg, Oral, Q6H PRN **OR** acetaminophen (TYLENOL) suppository 650 mg, 650 mg, Rectal, Q6H PRN, Idelle Crouch, MD .  albuterol (PROVENTIL) (2.5 MG/3ML) 0.083% nebulizer solution 2.5 mg, 2.5 mg, Inhalation, Q6H PRN, Idelle Crouch, MD, 2.5 mg at 04/27/17 0611 .  ALPRAZolam Duanne Moron) tablet 0.5 mg, 0.5 mg, Oral, TID, Mortimer Fries, Kurian, MD, 0.5 mg at 04/27/17 1244 .  aspirin EC tablet 81 mg, 81 mg, Oral, Daily, Idelle Crouch, MD, 81 mg  at 04/27/17 1000 .  bisacodyl (DULCOLAX) suppository 10 mg, 10 mg, Rectal, Daily PRN, Idelle Crouch, MD .  chlorhexidine (PERIDEX) 0.12 % solution 15 mL, 15 mL, Mouth Rinse, BID, Ramachandran, Pradeep, MD .  cloNIDine (CATAPRES) tablet 0.2 mg, 0.2 mg, Oral, Q8H, Sparks, Leonie Douglas, MD, Stopped at 04/26/17 0700 .  diphenhydrAMINE (BENADRYL) injection 12.5 mg, 12.5 mg, Intravenous, Q6H PRN **OR** diphenhydrAMINE (BENADRYL) 12.5 MG/5ML elixir 12.5 mg, 12.5 mg, Oral, Q6H PRN, Mortimer Fries, Kurian, MD .  docusate sodium (COLACE) capsule 100 mg, 100 mg, Oral, BID, Idelle Crouch, MD, 100 mg at 04/27/17 1000 .  gabapentin (NEURONTIN) capsule 800 mg, 800 mg, Oral, TID, Idelle Crouch, MD, 800 mg at 04/27/17 0951 .  haloperidol lactate (HALDOL) injection 1 mg, 1 mg, Intravenous, Q6H PRN, Idelle Crouch, MD, 1 mg at 04/27/17 0259 .  heparin injection 5,000 Units, 5,000 Units, Subcutaneous, Q8H, Idelle Crouch, MD, 5,000 Units at 04/27/17 1439 .  insulin aspart (novoLOG) injection 0-9 Units, 0-9 Units, Subcutaneous, TID WC, Idelle Crouch, MD, 7 Units at 04/27/17 1244 .  LORazepam (ATIVAN) injection 0.5 mg, 0.5 mg, Intravenous, Q4H PRN, Idelle Crouch, MD, 0.5 mg at 04/27/17 0711 .  metoCLOPramide (REGLAN) injection 5 mg, 5 mg, Intravenous, Q6H, Idelle Crouch, MD, 5 mg at 04/27/17 1245 .  mometasone-formoterol (DULERA) 200-5 MCG/ACT inhaler 2 puff, 2 puff, Inhalation, BID, Idelle Crouch, MD, 2 puff at 04/27/17 0729 .  morphine 2 mg/mL PCA injection, , Intravenous, Q4H, Kasa, Kurian, MD .  naloxone (NARCAN) injection 0.4 mg, 0.4 mg, Intravenous, PRN **AND** sodium chloride flush (NS) 0.9 % injection 9 mL, 9 mL, Intravenous, PRN, Mortimer Fries, Kurian, MD .  ondansetron (ZOFRAN) tablet 4 mg, 4 mg, Oral, Q6H PRN **OR** ondansetron (ZOFRAN) injection 4 mg, 4 mg, Intravenous, Q6H PRN, Idelle Crouch, MD .  pantoprazole (PROTONIX) EC tablet 40 mg, 40 mg, Oral, BID, Vaughan Basta, MD .   tiotropium (SPIRIVA) inhalation capsule 18 mcg, 18 mcg, Inhalation, Daily, Idelle Crouch, MD, 18 mcg at 04/27/17 0729 .  venlafaxine XR (EFFEXOR-XR) 24 hr capsule 150 mg, 150 mg, Oral, Daily, Idelle Crouch, MD, 150 mg at 04/27/17 1638  Constitutional Exam  BMI Assessment: Estimated body mass index is 33.17 kg/m as calculated from the following:   Height as of 04/25/17: _0  (1.473 m).   Weight as of 04/26/17: 158 lb 11.7 oz (72 kg).  Assessment  Primary Diagnosis & Pertinent Problem List: The primary encounter diagnosis was Opiate withdrawal ([place]). Diagnoses of Intractable vomiting with nausea, unspecified vomiting type, Presence of intrathecal pump, End of battery life of intrathecal infusion pump, and Pain syndrome, chronic were also pertinent to this visit.  Status Diagnosis  Active Persistent  1. Opiate withdrawal ([place])   2. Intractable vomiting with nausea, unspecified vomiting type   3. Presence of intrathecal pump   4. End of battery life of intrathecal infusion pump   5. Pain syndrome, chronic   6. Possible acute MI   Plan of Care  Recommendations:  1). Morphine PCA. Basal: 77m/hr. Dose: 2-2.5 mg/dose. Interval: 8-10 minutes. Calculate total amount use in 48 hours. Divide by 2 to get the average 24 hour use of morphine in mg. Multiply this IV dose by 2.5 to get the estimated 24 hour oral dose of morphine. This amount can then by divided by 2 to get the dose of MS Contin to give the patient orally, q12hrs. Do this while the patient is still hospitalized and under observation, in case adjustments are needed. Send patient home on orals, until she can be seen again by Dr. GDarral Dashfor pump replacement. 2). Contact Dr. CKandace Blitzat [phone] for further questions and recommendations on management. 3). Please CC: Dr. CKandace Blitzof [facility] [place] [address] [place] [id number]   NOTE: The last  readout would suggest that the patient was on PF-Morphine 5 mg/mL, at a rate of 2.7 mg/day. This should be an equivalent of approximately 270 mg/day of IV morphine.   Primary Care Physician: WKathrine Haddock NP [facility] Note by: FGaspar Cola MD Date: 04/27/2017; Time: 2:54 PM

## 2017-04-27 NOTE — Consult Note (Signed)
[facility] Consultation   SYNOPSIS   66 yo female on morphine pump, whose battery on the pump died, she has been experiencing severe opioid withdrawal symptoms.   ASSESSMENT/PLAN   Severe opioid withdrawal.  Severe agitation, delirium.  Essential hypertension, DM, COPD, OSA. Lung nodules.   --Continue precedex-will wean off --Anti-HTN as needed.  --Levemir, SSI.  --DVT prophylaxis.  -will start scheduled pain meds-will discuss on rounds with ICU staff   INTAKE / OUTPUT:  Intake/Output Summary (Last 24 hours) at 04/27/17 0811 Last data filed at 04/27/17 0745  Gross per 24 hour  Intake          3667.96 ml  Output             1525 ml  Net          2142.96 ml      MAJOR EVENTS/TEST RESULTS: --Admitted 9/22 with opioid withdrawal.   Best Practices  DVT Prophylaxis: heparin  ---------------------------------------  ---------------------------------------   Name: Jordan Chan MRN: [id number] DOB: 05-09-1951    ADMISSION DATE:  04/25/2017  hpi Sleeping, no increased WOB spoke to husband at bedside Intrathecal pump stopped working last week  ROS limited due to sleepiness  REVIEW OF SYSTEMS:   LIMITED due to lethargy and sleepiness   VITAL SIGNS: Temp:  [98 F (36.7 C)-99.1 F (37.3 C)] 98 F (36.7 C) (09/24 0720) Pulse Rate:  [68-112] 106 (09/24 0720) Resp:  [20-30] 26 (09/24 0720) BP: (74-147)/(42-113) 87/71 (09/24 0720) SpO2:  [83 %-100 %] 100 % (09/24 0720) HEMODYNAMICS:   INTAKE / OUTPUT:  Intake/Output Summary (Last 24 hours) at 04/27/17 0811 Last data filed at 04/27/17 0745  Gross per 24 hour  Intake          3667.96 ml  Output             1525 ml  Net          2142.96 ml    Physical Examination:   VS: BP (!) 87/71 (BP Location: Left Arm)   Pulse (!) 106   Temp 98 F (36.7 C) (Axillary)   Resp (!) 26   Ht 4\' 10"  (1.473 m)   Wt 158 lb 11.7 oz (72 kg)   LMP  (LMP Unknown)   SpO2 100%   BMI 33.17 kg/m    General Appearance:NAD, lethargic Neuro:without focal findings, altered, agitated.  Pulmonary: normal breath sounds., diaphragmatic excursion normal. CardiovascularNormal S1,S2.  No m/r/g.    Abdomen: Benign, Soft, non-tender, No masses, hepatosplenomegaly, No lymphadenopathy Endoc: No evident thyromegaly, no signs of acromegaly. Skin:   warm, no rashes, no ecchymosis  Extremities: normal, no cyanosis, clubbing, no edema, warm with normal capillary refill.    LABS: Reviewed   LABORATORY PANEL:   CBC  Recent Labs Lab 04/27/17 0405  WBC 15.6*  HGB 11.5*  HCT 34.0*  PLT 333    Chemistries   Recent Labs Lab 04/26/17 0505 04/27/17 0405  NA 139 140  K 3.9 4.0  CL 107 111  CO2 24 21*  GLUCOSE 215* 269*  BUN 11 13  CREATININE 0.70 0.54  CALCIUM 8.4* 7.6*  AST 25  --   ALT 13*  --   ALKPHOS 49  --   BILITOT 0.6  --      Recent Labs Lab 04/25/17 1718 04/25/17 2157 04/26/17 0808 04/26/17 1142 04/26/17 1727 04/27/17 0724  GLUCAP 213* 156* 196* 166* 160* 268*   No results for input(s): PHART, PCO2ART, PO2ART  in the last 168 hours.  Recent Labs Lab 04/25/17 0820 04/26/17 0505  AST 25 25  ALT 16 13*  ALKPHOS 61 49  BILITOT 0.6 0.6  ALBUMIN 4.2 3.3*    Cardiac Enzymes  Recent Labs Lab 04/25/17 0820  TROPONINI <0.03    Corrin Parker, M.D.  [facility]  Medical Director [place] Director [facility] Cardio-Pulmonary Department

## 2017-04-27 NOTE — Progress Notes (Signed)
[place] [facility] [place] NAME: Jordan Chan    MR#:  [id number]  DATE OF BIRTH:  May 28, 1951  SUBJECTIVE:  CHIEF COMPLAINT:   Chief Complaint  Patient presents with  . Pain Pump Malfunction    breast cancer patient    on morphine pump, have malfunction of pump for 2-3 days and came with severe withdrawal symptoms. On precedex drip. Tapering off today. She had some anxiety symptoms.   In afternoon also c/o some dyspnea symptoms, not much chest tightness.   EKG shows some new T wave inversions and QT prolongations.  REVIEW OF SYSTEMS:   Review of Systems  Constitutional: Negative for chills, fever and weight loss.  HENT: Negative for congestion, ear discharge, ear pain and hearing loss.   Eyes: Negative for blurred vision, double vision and pain.  Respiratory: Positive for shortness of breath. Negative for cough, sputum production and wheezing.   Cardiovascular: Negative for chest pain and palpitations.  Gastrointestinal: Negative for abdominal pain, constipation, nausea and vomiting.  Genitourinary: Negative for dysuria and frequency.  Musculoskeletal: Positive for back pain and myalgias.  Skin: Negative for itching and rash.  Neurological: Negative for dizziness, tremors, focal weakness and seizures.    DRUG ALLERGIES:   Allergies  Allergen Reactions  . Other Palpitations    IV steroids  . Pain Patch [Menthol] Anaphylaxis  . Avelox [Moxifloxacin Hcl In Nacl] Other (See Comments)    Upset stomach  . Erythromycin Nausea Only and Other (See Comments)    Can take a Z-Pak just fine  . Fentanyl   . Oxycontin [Oxycodone] Hives    VITALS:  Blood pressure (!) 94/53, pulse 85, temperature (!) 97.5 F (36.4 C), temperature source Axillary, resp. rate 18, height 4\' 10"  (1.473 m), weight 72 kg (158 lb 11.7 oz), SpO2 98 %.  PHYSICAL EXAMINATION:  GENERAL:  66 y.o.-year-old patient lying in the bed with no acute distress.  EYES: Pupils  equal, round, reactive to light and accommodation. No scleral icterus. HEENT: Head atraumatic, normocephalic. Oropharynx and nasopharynx clear.  NECK:  Supple, no jugular venous distention. No thyroid enlargement, no tenderness.  LUNGS: Normal breath sounds bilaterally, no wheezing, rales,rhonchi or crepitation. No use of accessory muscles of respiration.  CARDIOVASCULAR: S1, S2 normal. No murmurs, rubs, or gallops.  ABDOMEN: Soft, nontender, nondistended. Bowel sounds present. No organomegaly or mass.  EXTREMITIES: No pedal edema, cyanosis, or clubbing.  NEUROLOGIC: pt is following commands, moves limbs but due to c/o back pain, does not move much.  PSYCHIATRIC: The patient is alert and oriented.  SKIN: No obvious rash, lesion, or ulcer.   Physical Exam LABORATORY PANEL:   CBC  Recent Labs Lab 04/27/17 0405  WBC 15.6*  HGB 11.5*  HCT 34.0*  PLT 333   ------------------------------------------------------------------------------------------------------------------  Chemistries   Recent Labs Lab 04/26/17 0505 04/27/17 0405  NA 139 140  K 3.9 4.0  CL 107 111  CO2 24 21*  GLUCOSE 215* 269*  BUN 11 13  CREATININE 0.70 0.54  CALCIUM 8.4* 7.6*  AST 25  --   ALT 13*  --   ALKPHOS 49  --   BILITOT 0.6  --    ------------------------------------------------------------------------------------------------------------------  Cardiac Enzymes  Recent Labs Lab 04/27/17 1341 04/27/17 1720  TROPONINI 0.39* 0.32*   ------------------------------------------------------------------------------------------------------------------  RADIOLOGY:  No results found.  ASSESSMENT AND PLAN:   Principal Problem:   Opioid withdrawal (HCC) Active Problems:   Chronic back pain   Intractable nausea and  vomiting   Acute encephalopathy   Opiate withdrawal (HCC)  * severe opioid withdrawal   IV precedex drip.   Manage per intensivist   Now tapering off the drip.  * opioid pump  malfunction   For last 10 years on morphin pump.   Pain management clinic consult.   Started on morphine to help.   I also spoke to his pain clinic and left a msg with receptionist and told her to call me back and let me speak to fellow or the doctor. They were working on getting    * Dyspnea, elevated troponin, EKG changes.    NSTEMI    Appreciated urgent help of cardio     Heparin drip, IV, on ASA.  * COPD    No exacerbation    Cont Inhalers and nebs.  * Hypertension   Hold meds due to hypotension  * DM    As due to sedation, No oral intake.   Keep on ISS, no long acting insuline now.     All the records are reviewed and case discussed with Care Management/Social Workerr. Management plans discussed with the patient, family and they are in agreement.  CODE STATUS: Full.  TOTAL TIME TAKING CARE OF THIS PATIENT: 55 critical care minutes.   Spoke to pt's daughter in room, her pain clinic at [facility] and with cardiologist after troponin rise.  POSSIBLE D/C IN 2-3 DAYS, DEPENDING ON CLINICAL CONDITION.   Vaughan Basta M.D on 04/27/2017   Between 7am to 6pm - Pager - [phone]  After 6pm go to [email or web address] - password [email or web address] [facility]  Office  [phone]  CC: Primary care physician; Kathrine Haddock, NP  Note: This dictation was prepared with Dragon dictation along with smaller phrase technology. Any transcriptional errors that result from this process are unintentional.

## 2017-04-27 NOTE — Consult Note (Signed)
Cardiology Consult    Patient ID: [name] MRN: [id number], DOB/AGE: Jan 26, 1951   Admit date: 04/25/2017 Date of Consult: 04/27/2017  Primary Physician: Kathrine Haddock, NP Primary Cardiologist: new - M. Fletcher Anon, MD  Requesting Provider: Doylene Canning, MD  Patient Profile    Jordan Chan is a 66 y.o. female with a history of chronic low back pain, DM x 19 yrs, remote tob abuse, COPD, OSA, and breast cancer, who is being seen today for the evaluation of abnl ECG/NSTEMI at the request of Dr. Anselm Jungling.  Past Medical History   Past Medical History:  Diagnosis Date  . Arthritis   . Asthma   . Breast cancer ([place]) 1998   right breast ca with mastectomy and chemotherapy and radiation  . COPD (chronic obstructive pulmonary disease) ([place])   . Diabetes mellitus without complication ([place])   . Endometriosis   . GERD (gastroesophageal reflux disease)   . IBS (irritable bowel syndrome)   . Low back pain    a. Implanted morphine/bupivicaine/clonidine pump.  . Sleep apnea     Past Surgical History:  Procedure Laterality Date  . ABDOMINAL HYSTERECTOMY  1987  . BACK SURGERY     Tailbone removed following fracture  . COLONOSCOPY WITH PROPOFOL N/A 01/01/2017   Procedure: COLONOSCOPY WITH PROPOFOL;  Surgeon: Jonathon Bellows, MD;  Location: [facility] ENDOSCOPY;  Service: Endoscopy;  Laterality: N/A;  . ELBOW ARTHROSCOPY WITH TENDON RECONSTRUCTION    . ESOPHAGOGASTRODUODENOSCOPY (EGD) WITH PROPOFOL N/A 01/01/2017   Procedure: ESOPHAGOGASTRODUODENOSCOPY (EGD) WITH PROPOFOL;  Surgeon: Jonathon Bellows, MD;  Location: [facility] ENDOSCOPY;  Service: Endoscopy;  Laterality: N/A;  . MASTECTOMY  06/1997  . morphine pump  2011  . PLANTAR FASCIA RELEASE       Allergies  Allergies  Allergen Reactions  . Other Palpitations    IV steroids  . Pain Patch [Menthol] Anaphylaxis  . Avelox [Moxifloxacin Hcl In Nacl] Other (See Comments)    Upset stomach  . Erythromycin Nausea Only and Other (See Comments)   Can take a Z-Pak just fine  . Fentanyl   . Oxycontin [Oxycodone] Hives    History of Present Illness     66 y.o. female with a history of chronic low back pain, DM x 19 yrs, remote tob abuse, COPD, OSA, and breast cancer s/p R mastectomy.  In the setting of profound low back pain, she is followed in pain clinic and is s/p implanted intrathecal pump for ongoing morphine/bupivicaine/clonidine therapy.  Pt says that this is successful in controlling her pain.  She lives locally with her son who is mentally handicapped and thus she is his care provider.  She also helps to take care of her grandchildren.  She says she's fairly active and always on the go.  She has no prior h/o chest pain or DOE, though one of her dtrs says that she has seen her c/o DOE before.  Ms. Scibilia was in her usoh until 3-4 days prior to admission, when she developed worsening worsening low back pain, n, v, and confusion in the setting of her intrathecal pump no longer working.  She is scheduled to have battery replaced in [place] on Wed 9/26.  In the setting of pain and morphine w/d symptoms, she presented to the [facility] ED on 9/22 and was admitted for further eval.  She was hypotensive and tachycardic on arrival.  Trop was nl.  ECG was non-acute.  She was admitted to ICU and initially treated with IVF, MS contin,  PO clonidine and prn ativan/haldol. She was quite agitated and precedex was initiated later in the afternoon on May 10, 2023.  Precedex was continued over the weekend and weaned earlier today.  It was noted by nsg that she had developed deep TWI in lead 2 on tele earlier today.  HRs also dropped into the 50's.  A 12 lead was performed and showed sinus brady, 57, with poor R progression - more pronounced than on 05/10/23 0833 ECG, and with new somewhat diffuse and deep I, II, III, aVL, aVF, V2  V6.  In that setting, troponin was evaluated and found to be elevated @ 0.39.  We were called to eval.  Pt denies chest pain but says that she  has been restless and mildly dyspneic all weekend.  Her shoulders and elbows hurt.  She attributes this to moving around in bed so much and leaning on them frequently.  Both shoulders and elbows are mildly sore to touch.    Inpatient Medications    . ALPRAZolam  0.5 mg Oral TID  . aspirin EC  81 mg Oral Daily  . chlorhexidine  15 mL Mouth Rinse BID  . cloNIDine  0.2 mg Oral Q8H  . docusate sodium  100 mg Oral BID  . gabapentin  800 mg Oral TID  . insulin aspart  0-9 Units Subcutaneous TID WC  . insulin detemir  10 Units Subcutaneous Daily  . metoCLOPramide (REGLAN) injection  5 mg Intravenous Q6H  . mometasone-formoterol  2 puff Inhalation BID  . morphine   Intravenous Q4H  . pantoprazole  40 mg Oral BID  . tiotropium  18 mcg Inhalation Daily  . venlafaxine XR  150 mg Oral Daily    Family History    Family History  Problem Relation Age of Onset  . Cancer Mother 2       lung  . Thyroid disease Mother   . Cancer Father 33       Pancreatic  . Hypertension Sister   . Cancer Sister        "cancer on face"  . Hyperlipidemia Sister   . Osteoporosis Maternal Grandmother   . Hyperlipidemia Son   . Seizures Son   . Cancer Paternal Grandmother        colon  . Arthritis Paternal Grandfather   . Breast cancer Neg Hx     Social History    Social History   Social History  . Marital status: Legally Separated    Spouse name: N/A  . Number of children: N/A  . Years of education: N/A   Occupational History  . Not on file.   Social History Main Topics  . Smoking status: Former Smoker    Types: Cigarettes    Quit date: 04/30/2004  . Smokeless tobacco: Never Used  . Alcohol use No  . Drug use: No  . Sexual activity: Not Currently    Birth control/ protection: Post-menopausal   Other Topics Concern  . Not on file   Social History Narrative  . No narrative on file     Review of Systems    General:  No chills, fever, night sweats or weight changes.  Cardiovascular:   No chest pain, +++ dyspnea, no edema, orthopnea, palpitations, paroxysmal nocturnal dyspnea. Dermatological: No rash, lesions/masses Respiratory: No cough, +++ dyspnea Urologic: No hematuria, dysuria Abdominal:   +++ nausea, vomiting prior to admission, no diarrhea, bright red blood per rectum, melena, or hematemesis Neurologic:  Confused on admission.  No visual changes,  wkns. All other systems reviewed and are otherwise negative except as noted above.  Physical Exam    Blood pressure (!) 105/54, pulse (!) 55, temperature 98 F (36.7 C), temperature source Axillary, resp. rate 19, height 4\' 10"  (1.473 m), weight 158 lb 11.7 oz (72 kg), SpO2 93 %.  General: Pleasant, NAD Psych: Normal affect. Neuro: Alert and oriented X 3. Moves all extremities spontaneously. HEENT: Normal  Neck: Supple without bruits or JVD. Lungs:  Resp regular and unlabored, CTA. Heart: RRR no s3, s4, or murmurs. Abdomen: Soft, non-tender, non-distended, BS + x 4.  Extremities: No clubbing, cyanosis or edema. DP/PT/Radials 2+ and equal bilaterally.  Labs     Recent Labs  04/25/17 0820 04/27/17 1341  TROPONINI <0.03 0.39*   Lab Results  Component Value Date   WBC 15.6 (H) 04/27/2017   HGB 11.5 (L) 04/27/2017   HCT 34.0 (L) 04/27/2017   MCV 88.6 04/27/2017   PLT 333 04/27/2017     Recent Labs Lab 04/26/17 0505 04/27/17 0405  NA 139 140  K 3.9 4.0  CL 107 111  CO2 24 21*  BUN 11 13  CREATININE 0.70 0.54  CALCIUM 8.4* 7.6*  PROT 7.3  --   BILITOT 0.6  --   ALKPHOS 49  --   ALT 13*  --   AST 25  --   GLUCOSE 215* 269*   Lab Results  Component Value Date   CHOL 129 11/03/2016   HDL 59 11/03/2016   LDLCALC 56 11/03/2016   TRIG 69 11/03/2016     Radiology Studies    Dg Chest 2 View  Result Date: 04/25/2017 CLINICAL DATA:  History of breast cancer. EXAM: CHEST  2 VIEW COMPARISON:  March 02, 2017 FINDINGS: The heart size and mediastinal contours are within normal limits. Both lungs are  clear. The visualized skeletal structures are unremarkable. IMPRESSION: No active cardiopulmonary disease. Electronically Signed   By: Dorise Bullion III M.D   On: 04/25/2017 11:35   US Abdomen Complete  Result Date: 04/20/2017 CLINICAL DATA:  Nausea, diarrhea. EXAM: ABDOMEN ULTRASOUND COMPLETE COMPARISON:  CT scan of May 25, 2009. FINDINGS: Gallbladder: No gallstones or wall thickening visualized. No sonographic Murphy sign noted by sonographer. Common bile duct: Diameter: 3 mm which is within normal limits. Liver: No focal lesion identified. Increased echogenicity of hepatic parenchyma is noted. Portal vein is patent on color Doppler imaging with normal direction of blood flow towards the liver. IVC: No abnormality visualized. Pancreas: Visualized portion unremarkable. Spleen: Size and appearance within normal limits. Right Kidney: Length: 11.8 cm. 4.5 cm simple cyst is seen in upper pole. Echogenicity within normal limits. No mass or hydronephrosis visualized. Left Kidney: Length: 10.9 cm. Echogenicity within normal limits. No mass or hydronephrosis visualized. Abdominal aorta: No aneurysm visualized. Other findings: None. IMPRESSION: Increased echogenicity of hepatic parenchyma is noted suggesting fatty infiltration. 4.5 cm right renal cyst. No other abnormality seen in the abdomen. Electronically Signed   By: Marijo Conception, M.D.   On: 04/20/2017 09:16    ECG & Cardiac Imaging    Sinus brady, 57, ant infarct, inf, antlat deep TWI (all leads except aVR and V1)  Assessment & Plan    1.  NSTEMI: Pt presented with worsening back pain, n, v, confusion in the setting of intrathecal pump failure.  She has been restless and mildly dyspneic since admission by her report.  She was noted earlier today to have significant TWI in lead II on tele  and this was followed by a 12 lead that shows somewhat diffuse inf, antlat deep TWI with poor R progression concerning for prox LAD stenosis.  Trop 0.39.She  denies chest pain and is currently hemodynamically stable.  She is euvolemic on exam.  Echo is pending. Review of tele shows that she began to have T wave abnormalities in lead II on tele @ ~ 2300 on 9/22 and this became more pronounced throughout 20-May-2023.  Heparin and ASA ordered.  Will add high potency statin.  I'm not sure that she will tolerate  blocker as bp tends to be soft.  She has also been intermittently bradycardic.  Plan for cath tomorrow morning or sooner if necessary.  The patient understands that risks include but are not limited to stroke (1 in 1000), death (1 in 37), kidney failure [usually temporary] (1 in 500), bleeding (1 in 200), allergic reaction [possibly serious] (1 in 200), and agrees to proceed. I have also spoken to her dtr re: these plans.  2.  Chronic pain: w/ h/o intrathecal pump s/p failure (battery).  She is pending replacement on Wednesday 9/26 in [place].  She and her family have expressed the importance of this.  I did advise that until cardiac issues have been dealt with that surgeon in [facility] would defer procedure.  If she requires stenting, it would appear at this time that the procedure would have to be done on ASA/Brilinta.  Pain mgmt per CCM/Pain mgmt team.  3.  DM II:  Dx 1999. Insulin per IM.  4.  COPD: no active wheezing.  Signed, Murray Hodgkins, NP 04/27/2017, 4:01 PM  For questions or updates, please contact   Please consult [email or web address] for contact info under Cardiology/STEMI.

## 2017-04-27 NOTE — Progress Notes (Signed)
ANTICOAGULATION CONSULT NOTE - Initial Consult  Pharmacy Consult for Heparin Indication: ACS/STEMI  Allergies  Allergen Reactions  . Other Palpitations    IV steroids  . Pain Patch [Menthol] Anaphylaxis  . Avelox [Moxifloxacin Hcl In Nacl] Other (See Comments)    Upset stomach  . Erythromycin Nausea Only and Other (See Comments)    Can take a Z-Pak just fine  . Fentanyl   . Oxycontin [Oxycodone] Hives    Patient Measurements: Height: 4\' 10"  (147.3 cm) Weight: 158 lb 11.7 oz (72 kg) IBW/kg (Calculated) : 40.9 Heparin Dosing Weight: 56.4  Vital Signs: Temp: 97.5 F (36.4 C) (09/24 2000) Temp Source: Axillary (09/24 2000) BP: 108/69 (09/24 2300) Pulse Rate: 85 (09/24 1647)  Labs:  Recent Labs  04/25/17 0820 04/26/17 0505 04/27/17 0405 04/27/17 1341 04/27/17 1720 04/27/17 2204  HGB 14.6 13.0 11.5*  --   --   --   HCT 43.6 39.2 34.0*  --   --   --   PLT 443* 422 333  --   --   --   HEPARINUNFRC  --   --   --   --   --  0.31  CREATININE 0.63 0.70 0.54  --   --   --   TROPONINI <0.03  --   --  0.39* 0.32* 0.30*    Estimated Creatinine Clearance: 57.4 mL/min (by C-G formula based on SCr of 0.54 mg/dL).   Medical History: Past Medical History:  Diagnosis Date  . Arthritis   . Asthma   . Breast cancer ([place]) 1998   right breast ca with mastectomy and chemotherapy and radiation  . COPD (chronic obstructive pulmonary disease) ([place])   . Diabetes mellitus without complication ([place])   . Endometriosis   . GERD (gastroesophageal reflux disease)   . IBS (irritable bowel syndrome)   . Low back pain    a. Implanted morphine/bupivicaine/clonidine pump.  . Sleep apnea     Medications:  9/22 Heparin 5000U >> 9/24 Last SubQ Heparin 1439  Assessment: DM is a 88 YOF has a past medical history significant for DM, COPD, and chronic back pain with intrathecal pump followed by [facility] now with N/V, confusion and worsening pain. She was tachycardic and  hypotensive in the ER. The nurse noticed a difference in her EKG and a troponin was collected and found to be 0.39. Patient previously getting heparin 5000U SubQ.   Goal of Therapy:  Heparin level 0.3-0.7 units/ml Monitor platelets by anticoagulation protocol: Yes   Plan:  Start heparin infusion at 700 units/hr  No bolus due to SubQ Heparin administration at 1439 Will obtain Heparin level in 6 hours   9/24 @ 2200 HL 0.31 therapeutic, but borderline. Will increase rate to 750 unit/hr and will recheck HL w/ am labs. Baseline labs not drawn, getting stat aPTT, INR.  Tobie Lords, PharmD, BCPS Clinical Pharmacist 04/27/2017

## 2017-04-27 NOTE — Progress Notes (Signed)
Attempted to call MD Kasa with results of EKG, no answer and no return of phone call.  Inez Catalina, MD (hospitalist on case) to notify of EKG changes. New orders placed, will continue to monitor patient.

## 2017-04-27 NOTE — Progress Notes (Signed)
Notified MD Kasa that patient was experiencing pain/agitation that was not being controlled with current PRN regimen. See new orders, will continue to monitor patient.

## 2017-04-27 NOTE — Progress Notes (Signed)
EKG ordered placed due to changes seen on bedside monitor.

## 2017-04-28 ENCOUNTER — Encounter
Admission: EM | Disposition: A | Discharge status: Home / Self Care | Payer: Self-pay | Source: Home / Self Care | Attending: Internal Medicine

## 2017-04-28 ENCOUNTER — Encounter: Payer: Self-pay | Admitting: Internal Medicine

## 2017-04-28 ENCOUNTER — Inpatient Hospital Stay (HOSPITAL_COMMUNITY)
Admit: 2017-04-28 | Discharge: 2017-04-28 | Disposition: A | Discharge status: Home / Self Care | Payer: Medicare HMO | Attending: Internal Medicine | Admitting: Internal Medicine

## 2017-04-28 DIAGNOSIS — I2510 Atherosclerotic heart disease of native coronary artery without angina pectoris: Secondary | ICD-10-CM

## 2017-04-28 DIAGNOSIS — I252 Old myocardial infarction: Secondary | ICD-10-CM

## 2017-04-28 DIAGNOSIS — I214 Non-ST elevation (NSTEMI) myocardial infarction: Secondary | ICD-10-CM

## 2017-04-28 HISTORY — PX: LEFT HEART CATH AND CORONARY ANGIOGRAPHY: CATH118249

## 2017-04-28 LAB — TROPONIN I
Troponin I: 0.27 ng/mL (ref <0.03)
Troponin I: 0.25 ng/mL (ref <0.03)
Troponin I: 0.2 ng/mL (ref <0.03)
Troponin I: 0.21 ng/mL (ref <0.03)

## 2017-04-28 LAB — APTT: aPTT: 76 s — ABNORMAL HIGH (ref 24–36)

## 2017-04-28 LAB — HEPARIN LEVEL (UNFRACTIONATED): Heparin Unfractionated: 0.62 IU/mL (ref 0.30–0.70)

## 2017-04-28 LAB — BASIC METABOLIC PANEL WITH GFR
BUN: 15 mg/dL (ref 6–20)
CO2: 25 mmol/L (ref 22–32)
GFR calc non Af Amer: >60 mL/min (ref >60)
Glucose, Bld: 154 mg/dL — ABNORMAL HIGH (ref 65–99)
Potassium: 3.7 mmol/L (ref 3.5–5.1)

## 2017-04-28 LAB — BASIC METABOLIC PANEL
Anion gap: 7 (ref 5–15)
Calcium: 8.1 mg/dL — ABNORMAL LOW (ref 8.9–10.3)
Chloride: 108 mmol/L (ref 101–111)
Creatinine, Ser: 0.58 mg/dL (ref 0.44–1.00)
GFR calc Af Amer: >60 mL/min (ref >60)
Sodium: 140 mmol/L (ref 135–145)

## 2017-04-28 LAB — CBC
HCT: 35.6 % (ref 35.0–47.0)
Hemoglobin: 11.8 g/dL — ABNORMAL LOW (ref 12.0–16.0)
MCH: 29.3 pg (ref 26.0–34.0)
MCHC: 33.1 g/dL (ref 32.0–36.0)
MCV: 88.5 fL (ref 80.0–100.0)
Platelets: 383 10*3/uL (ref 150–440)
RBC: 4.02 MIL/uL (ref 3.80–5.20)
RDW: 16.3 % — ABNORMAL HIGH (ref 11.5–14.5)
WBC: 17.8 10*3/uL — ABNORMAL HIGH (ref 3.6–11.0)

## 2017-04-28 LAB — GLUCOSE, CAPILLARY
Glucose-Capillary: 123 mg/dL — ABNORMAL HIGH (ref 65–99)
Glucose-Capillary: 123 mg/dL — ABNORMAL HIGH (ref 65–99)
Glucose-Capillary: 149 mg/dL — ABNORMAL HIGH (ref 65–99)
Glucose-Capillary: 163 mg/dL — ABNORMAL HIGH (ref 65–99)

## 2017-04-28 LAB — PROTIME-INR
INR: 1.24
INR: 1.2
Prothrombin Time: 15.5 s — ABNORMAL HIGH (ref 11.4–15.2)
Prothrombin Time: 15.1 seconds (ref 11.4–15.2)

## 2017-04-28 LAB — ECHOCARDIOGRAM COMPLETE
Height: 58 in
Weight: 2539.7 oz

## 2017-04-28 LAB — MAGNESIUM: Magnesium: 1.8 mg/dL (ref 1.7–2.4)

## 2017-04-28 SURGERY — LEFT HEART CATH AND CORONARY ANGIOGRAPHY
Anesthesia: Moderate Sedation

## 2017-04-28 MED ORDER — IOPAMIDOL (ISOVUE-300) INJECTION 61%
INTRAVENOUS | Status: DC | PRN
  Administered 2017-04-28: 70 mL via INTRA_ARTERIAL

## 2017-04-28 MED ORDER — HEPARIN SODIUM (PORCINE) 1000 UNIT/ML IJ SOLN
INTRAMUSCULAR | Status: DC | PRN
  Administered 2017-04-28: 3500 [IU] via INTRAVENOUS

## 2017-04-28 MED ORDER — MIDAZOLAM HCL 2 MG/2ML IJ SOLN
INTRAMUSCULAR | Status: DC | PRN
  Administered 2017-04-28: 0.5 mg via INTRAVENOUS

## 2017-04-28 MED ORDER — SODIUM CHLORIDE 0.9 % IV SOLN
250.0000 mL | INTRAVENOUS | Status: DC | PRN
  Administered 2017-04-28: 250 mL via INTRAVENOUS

## 2017-04-28 MED ORDER — METOPROLOL SUCCINATE ER 25 MG PO TB24
12.5000 mg | ORAL_TABLET | Freq: Every day | ORAL | Status: DC
  Administered 2017-04-28 – 2017-05-01 (×4): 12.5 mg via ORAL
  Filled 2017-04-28: qty 1
  Filled 2017-04-28 (×2): qty 0.5
  Filled 2017-04-28: qty 1
  Filled 2017-04-28: qty 0.5

## 2017-04-28 MED ORDER — HEPARIN SODIUM (PORCINE) 1000 UNIT/ML IJ SOLN
INTRAMUSCULAR | Status: AC
  Filled 2017-04-28: qty 1

## 2017-04-28 MED ORDER — FUROSEMIDE 10 MG/ML IJ SOLN
20.0000 mg | Freq: Every day | INTRAMUSCULAR | Status: DC
  Administered 2017-04-28 – 2017-04-30 (×3): 20 mg via INTRAVENOUS
  Filled 2017-04-28: qty 4
  Filled 2017-04-28 (×2): qty 2

## 2017-04-28 MED ORDER — DIPHENHYDRAMINE HCL 50 MG/ML IJ SOLN
INTRAMUSCULAR | Status: AC
  Filled 2017-04-28: qty 1

## 2017-04-28 MED ORDER — SODIUM CHLORIDE 0.9% FLUSH: 3.0000 mL | INTRAVENOUS | Status: DC | PRN

## 2017-04-28 MED ORDER — VERAPAMIL HCL 2.5 MG/ML IV SOLN
INTRAVENOUS | Status: AC
  Filled 2017-04-28: qty 2

## 2017-04-28 MED ORDER — SODIUM CHLORIDE 0.9% FLUSH
3.0000 mL | Freq: Two times a day (BID) | INTRAVENOUS | Status: DC
  Administered 2017-04-28 – 2017-05-01 (×5): 3 mL via INTRAVENOUS

## 2017-04-28 MED ORDER — MIDAZOLAM HCL 2 MG/2ML IJ SOLN
INTRAMUSCULAR | Status: AC
  Filled 2017-04-28: qty 2

## 2017-04-28 MED ORDER — VERAPAMIL HCL 2.5 MG/ML IV SOLN
INTRAVENOUS | Status: DC | PRN
  Administered 2017-04-28: 2.5 mg via INTRA_ARTERIAL

## 2017-04-28 MED ORDER — ENOXAPARIN SODIUM 40 MG/0.4ML ~~LOC~~ SOLN
40.0000 mg | SUBCUTANEOUS | Status: DC
  Administered 2017-04-28 – 2017-04-30 (×3): 40 mg via SUBCUTANEOUS
  Filled 2017-04-28 (×3): qty 0.4

## 2017-04-28 SURGICAL SUPPLY — 8 items
CATH 5F 110X4 TIG (CATHETERS) ×2 IMPLANT
CATH INFINITI 5FR ANG PIGTAIL (CATHETERS) ×2 IMPLANT
DEVICE RAD TR BAND REGULAR (VASCULAR PRODUCTS) ×2 IMPLANT
GLIDESHEATH SLEND SS 6F .021 (SHEATH) ×2 IMPLANT
KIT MANI 3VAL PERCEP (MISCELLANEOUS) ×2 IMPLANT
PACK CARDIAC CATH (CUSTOM PROCEDURE TRAY) ×2 IMPLANT
WIRE HITORQ VERSACORE ST 145CM (WIRE) ×2 IMPLANT
WIRE ROSEN-J .035X260CM (WIRE) ×2 IMPLANT

## 2017-04-28 NOTE — Progress Notes (Addendum)
Returned from special procedures s/p cath. Reported no significant CAD. LVEF 25-30%. Takotsubo cardiomyopathy. Heparin gtt  DC'd in cath lab. Left radial site without hematoma/ bleeding.TR band removal completed in cath lab. Moves fingers and fingers are warm.  Ativan given for anxiety. Daughter at bedside.

## 2017-04-28 NOTE — Progress Notes (Signed)
Progress Note  Patient Name: Jordan Chan Date of Encounter: 04/28/2017  Primary Cardiologist: Thurston Hole  Subjective   Patient complains of continued severe back pain. She denies chest pain but has stable chronic orthopnea. She reports having slept in a recliner for at least the last 8 years. She requests battery exchange for her pain infusion pump as soon as possible.  Inpatient Medications    Scheduled Meds: . [MAR Hold] ALPRAZolam  0.5 mg Oral TID  . [MAR Hold] aspirin EC  81 mg Oral Daily  . [MAR Hold] atorvastatin  80 mg Oral q1800  . [MAR Hold] chlorhexidine  15 mL Mouth Rinse BID  . [MAR Hold] cloNIDine  0.2 mg Oral Q8H  . diphenhydrAMINE      . [MAR Hold] docusate sodium  100 mg Oral BID  . [MAR Hold] gabapentin  800 mg Oral TID  . [MAR Hold] insulin aspart  0-9 Units Subcutaneous TID WC  . [MAR Hold] insulin detemir  10 Units Subcutaneous Daily  . [MAR Hold] metoCLOPramide (REGLAN) injection  5 mg Intravenous Q6H  . [MAR Hold] mometasone-formoterol  2 puff Inhalation BID  . [MAR Hold] morphine   Intravenous Q4H  . [MAR Hold] pantoprazole  40 mg Oral BID  . sodium chloride flush  3 mL Intravenous Q12H  . [MAR Hold] tiotropium  18 mcg Inhalation Daily  . [MAR Hold] venlafaxine XR  150 mg Oral Daily   Continuous Infusions: . sodium chloride 20 mL/hr at 04/27/17 1647  . sodium chloride    . heparin 750 Units/hr (04/28/17 0700)   PRN Meds: sodium chloride, [MAR Hold] acetaminophen **OR** [MAR Hold] acetaminophen, [MAR Hold] albuterol, [MAR Hold] bisacodyl, [MAR Hold] diphenhydrAMINE **OR** [MAR Hold] diphenhydrAMINE, [MAR Hold] LORazepam, [MAR Hold] naloxone **AND** [MAR Hold] sodium chloride flush, [MAR Hold] nitroGLYCERIN, [MAR Hold] ondansetron **OR** [MAR Hold] ondansetron (ZOFRAN) IV, sodium chloride flush   Vital Signs    Vitals:   04/28/17 1200 04/28/17 1215 04/28/17 1230 04/28/17 1245  BP: 126/72 (!) 142/74 (!) 113/53   Pulse: 97 (!) 114 97   Resp:  19 20 20    Temp:      TempSrc:      SpO2: 99% 96% 98% 97%  Weight:      Height:        Intake/Output Summary (Last 24 hours) at 04/28/17 1305 Last data filed at 04/28/17 0930  Gross per 24 hour  Intake           987.46 ml  Output             1490 ml  Net          -502.54 ml   Filed Weights   04/25/17 1700 04/26/17 0500 04/28/17 0948  Weight: 151 lb 10.8 oz (68.8 kg) 158 lb 11.7 oz (72 kg) 158 lb (71.7 kg)    Telemetry    Normal sinus rhythm with PACs - Personally Reviewed  ECG    04/27/17: Normal sinus rhythm with diffuse T-wave inversions and prolonged QT. Poor R-wave progression. - Personally Reviewed  Physical Exam   GEN: Frail woman appearing older than her stated age, writhing in bed. She frequently cries and calls out in pain. Neck: No JVD Cardiac: RRR, no murmurs, rubs, or gallops.  Respiratory: Clear to auscultation bilaterally. GI: Soft, nontender, non-distended  MS: No edema; No deformity. Neuro:  Nonfocal  Psych: Labile affect.  Labs    Chemistry Recent Labs Lab 04/25/17 0820 04/26/17 0505 04/27/17  0405 04/28/17 0439  NA 138 139 140 140  K 3.8 3.9 4.0 3.7  CL 101 107 111 108  CO2 27 24 21* 25  GLUCOSE 210* 215* 269* 154*  BUN 11 11 13 15   CREATININE 0.63 0.70 0.54 0.58  CALCIUM 9.7 8.4* 7.6* 8.1*  PROT 8.8* 7.3  --   --   ALBUMIN 4.2 3.3*  --   --   AST 25 25  --   --   ALT 16 13*  --   --   ALKPHOS 61 49  --   --   BILITOT 0.6 0.6  --   --   GFRNONAA >60 >60 >60 >60  GFRAA >60 >60 >60 >60  ANIONGAP 10 8 8 7      Hematology Recent Labs Lab 04/26/17 0505 04/27/17 0405 04/28/17 0439  WBC 20.8* 15.6* 17.8*  RBC 4.44 3.84 4.02  HGB 13.0 11.5* 11.8*  HCT 39.2 34.0* 35.6  MCV 88.2 88.6 88.5  MCH 29.4 30.0 29.3  MCHC 33.3 33.9 33.1  RDW 15.8* 15.8* 16.3*  PLT 422 333 383    Cardiac Enzymes Recent Labs Lab 04/27/17 1720 04/27/17 2204 04/28/17 0037 04/28/17 0652  TROPONINI 0.32* 0.30* 0.27* 0.25*   No results for input(s):  TROPIPOC in the last 168 hours.   BNPNo results for input(s): BNP, PROBNP in the last 168 hours.   DDimer No results for input(s): DDIMER in the last 168 hours.   Radiology    No results found.  Cardiac Studies   LHC (04/28/17): 1. No angiographically significant coronary artery disease. 2. Severely reduced LV contraction (LVEF 25-30%) with mid and apical akinesis, consistent with Takotsubo (stress-induced) cardiomyopathy. 3. Mildly elevated left ventricular filling pressure.  Echo (04/27/17): Report pending  Patient Profile     66 y.o. female woman of chronic back pain, diabetes mellitus, remote tobacco use, COPD, obstructive sleep apnea, and breast cancer, admitted with profound pain and opioid withdrawal in the setting of failure of her implanted thecal pump. She complained of shortness of breath and was found to have marked EKG changes and mild troponin elevation.  Assessment & Plan    Stress-induced cardiomyopathy Patient's history and symptoms as well as catheterization findings are consistent with a stress-induced cardiomyopathy. LVEDP is mildly elevated, consistent with Ms. Zigmund Daniel' history of orthopnea.  Gentle diuresis; furosemide 20 mg IV daily has been ordered.  Initiate metoprolol succinate 12.5 mg daily, to be carefully uptitrated as heart rate and blood pressure allow.  If blood pressure allows, consider addition of low-dose ACE inhibitor or ARB tomorrow.  Manage pain aggressively, as pain and opioid withdrawal are likely the inciting factors for Ms. Zigmund Daniel' stress-induced cardiomyopathy.  Chronic pain It is likely the driving force behind most of Ms. Zeisler problems, including her cardiomyopathy described above. She was previously scheduled for replacement of her intrathecal pump battery tomorrow in [place]. The setting of acute systolic heart failure with stress-induced cardiomyopathy, we would ideally delay any elective procedure pending optimization  of her volume status and LV function. However, if refractory pain is the driving force behind her cardiomyopathy, I would advocate for expeditious replacement of the intrathecal pump battery. If this could be done without general anesthesia, that would be preferable.    Signed, [name], MD  04/28/2017, 1:05 PM

## 2017-04-28 NOTE — Progress Notes (Signed)
Pt remains in pain when awake. Slept well through most of shift. Around 0600 pt began to have increased shoulder pain and felt like she "couldnt breath, and like someone to was sitting on her chest". O2 sats 98%, RR 22, NAD. NP informed, troponin's and EKG ordered. Will continue to monitor.

## 2017-04-28 NOTE — Progress Notes (Signed)
ANTICOAGULATION CONSULT NOTE - Initial Consult  Pharmacy Consult for Heparin Indication: ACS/STEMI  Allergies  Allergen Reactions  . Other Palpitations    IV steroids  . Pain Patch [Menthol] Anaphylaxis  . Avelox [Moxifloxacin Hcl In Nacl] Other (See Comments)    Upset stomach  . Erythromycin Nausea Only and Other (See Comments)    Can take a Z-Pak just fine  . Fentanyl   . Oxycontin [Oxycodone] Hives    Patient Measurements: Height: 4\' 10"  (147.3 cm) Weight: 158 lb 11.7 oz (72 kg) IBW/kg (Calculated) : 40.9 Heparin Dosing Weight: 56.4  Vital Signs: Temp: 97.5 F (36.4 C) (09/25 0200) Temp Source: Axillary (09/25 0200) BP: 98/49 (09/25 0300)  Labs:  Recent Labs  04/26/17 0505 04/27/17 0405  04/27/17 1720 04/27/17 2204 04/28/17 0037 04/28/17 0439  HGB 13.0 11.5*  --   --   --   --  11.8*  HCT 39.2 34.0*  --   --   --   --  35.6  PLT 422 333  --   --   --   --  383  APTT  --   --   --   --   --  76*  --   LABPROT  --   --   --   --   --  15.5* 15.1  INR  --   --   --   --   --  1.24 1.20  HEPARINUNFRC  --   --   --   --  0.31  --  0.62  CREATININE 0.70 0.54  --   --   --   --  0.58  TROPONINI  --   --   < > 0.32* 0.30* 0.27*  --   < > = values in this interval not displayed.  Estimated Creatinine Clearance: 57.4 mL/min (by C-G formula based on SCr of 0.58 mg/dL).   Medical History: Past Medical History:  Diagnosis Date  . Arthritis   . Asthma   . Breast cancer ([place]) 1998   right breast ca with mastectomy and chemotherapy and radiation  . COPD (chronic obstructive pulmonary disease) ([place])   . Diabetes mellitus without complication ([place])   . Endometriosis   . GERD (gastroesophageal reflux disease)   . IBS (irritable bowel syndrome)   . Low back pain    a. Implanted morphine/bupivicaine/clonidine pump.  . Sleep apnea     Medications:  9/22 Heparin 5000U >> 9/24 Last SubQ Heparin 1439  Assessment: DM is a 56 YOF has a past medical history  significant for DM, COPD, and chronic back pain with intrathecal pump followed by [facility] now with N/V, confusion and worsening pain. She was tachycardic and hypotensive in the ER. The nurse noticed a difference in her EKG and a troponin was collected and found to be 0.39. Patient previously getting heparin 5000U SubQ.   Goal of Therapy:  Heparin level 0.3-0.7 units/ml Monitor platelets by anticoagulation protocol: Yes   Plan:  Start heparin infusion at 700 units/hr  No bolus due to SubQ Heparin administration at 1439 Will obtain Heparin level in 6 hours   9/24 @ 2200 HL 0.31 therapeutic, but borderline. Will increase rate to 750 unit/hr and will recheck HL w/ am labs. Baseline labs not drawn, getting stat aPTT, INR.  9/25 @ 0439 HL 0.62 therapeutic. Will continue current rate and will recheck next HL w/ am labs. APTT elevated from heparin, correlating w/ HL. Will continue dosing off of  HL.  Tobie Lords, PharmD, BCPS Clinical Pharmacist 04/28/2017

## 2017-04-28 NOTE — Progress Notes (Signed)
Transfered to cath lab per bed. Report to [place], [place]

## 2017-04-28 NOTE — H&P (View-Only) (Signed)
Cardiology Consult    [name] MRN: [id number], DOB/AGE: 1950/08/19   Admit date: 04/25/2017 Date of Consult: 04/27/2017  Primary Physician: Kathrine Haddock, NP Primary Cardiologist: new - M. Fletcher Anon, MD  Requesting Provider: Doylene Canning, MD  Patient Profile    Jordan Chan is a 66 y.o. female with a history of chronic low back pain, DM x 19 yrs, remote tob abuse, COPD, OSA, and breast cancer, who is being seen today for the evaluation of abnl ECG/NSTEMI at the request of Dr. Anselm Jungling.  Past Medical History   Past Medical History:  Diagnosis Date  . Arthritis   . Asthma   . Breast cancer ([place]) 1998   right breast ca with mastectomy and chemotherapy and radiation  . COPD (chronic obstructive pulmonary disease) ([place])   . Diabetes mellitus without complication ([place])   . Endometriosis   . GERD (gastroesophageal reflux disease)   . IBS (irritable bowel syndrome)   . Low back pain    a. Implanted morphine/bupivicaine/clonidine pump.  . Sleep apnea     Past Surgical History:  Procedure Laterality Date  . ABDOMINAL HYSTERECTOMY  1987  . BACK SURGERY     Tailbone removed following fracture  . COLONOSCOPY WITH PROPOFOL N/A 01/01/2017   Procedure: COLONOSCOPY WITH PROPOFOL;  Surgeon: Jonathon Bellows, MD;  Location: [facility] ENDOSCOPY;  Service: Endoscopy;  Laterality: N/A;  . ELBOW ARTHROSCOPY WITH TENDON RECONSTRUCTION    . ESOPHAGOGASTRODUODENOSCOPY (EGD) WITH PROPOFOL N/A 01/01/2017   Procedure: ESOPHAGOGASTRODUODENOSCOPY (EGD) WITH PROPOFOL;  Surgeon: Jonathon Bellows, MD;  Location: [facility] ENDOSCOPY;  Service: Endoscopy;  Laterality: N/A;  . MASTECTOMY  06/1997  . morphine pump  2011  . PLANTAR FASCIA RELEASE       Allergies  Allergies  Allergen Reactions  . Other Palpitations    IV steroids  . Pain Patch [Menthol] Anaphylaxis  . Avelox [Moxifloxacin Hcl In Nacl] Other (See Comments)    Upset stomach  . Erythromycin Nausea Only and Other (See Comments)   Can take a Z-Pak just fine  . Fentanyl   . Oxycontin [Oxycodone] Hives    History of Present Illness     66 y.o. female with a history of chronic low back pain, DM x 19 yrs, remote tob abuse, COPD, OSA, and breast cancer s/p R mastectomy.  In the setting of profound low back pain, she is followed in pain clinic and is s/p implanted intrathecal pump for ongoing morphine/bupivicaine/clonidine therapy.  Pt says that this is successful in controlling her pain.  She lives locally with her son who is mentally handicapped and thus she is his care provider.  She also helps to take care of her grandchildren.  She says she's fairly active and always on the go.  She has no prior h/o chest pain or DOE, though one of her dtrs says that she has seen her c/o DOE before.  Jordan Chan was in her usoh until 3-4 days prior to admission, when she developed worsening worsening low back pain, n, v, and confusion in the setting of her intrathecal pump no longer working.  She is scheduled to have battery replaced in [place] on Wed 9/26.  In the setting of pain and morphine w/d symptoms, she presented to the [facility] ED on 9/22 and was admitted for further eval.  She was hypotensive and tachycardic on arrival.  Trop was nl.  ECG was non-acute.  She was admitted to ICU and initially treated with IVF, MS contin,  PO clonidine and prn ativan/haldol. She was quite agitated and precedex was initiated later in the afternoon on 2023-05-22.  Precedex was continued over the weekend and weaned earlier today.  It was noted by nsg that she had developed deep TWI in lead 2 on tele earlier today.  HRs also dropped into the 50's.  A 12 lead was performed and showed sinus brady, 57, with poor R progression - more pronounced than on May 22, 2023 0833 ECG, and with new somewhat diffuse and deep I, II, III, aVL, aVF, V2  V6.  In that setting, troponin was evaluated and found to be elevated @ 0.39.  We were called to eval.  Pt denies chest pain but says that she  has been restless and mildly dyspneic all weekend.  Her shoulders and elbows hurt.  She attributes this to moving around in bed so much and leaning on them frequently.  Both shoulders and elbows are mildly sore to touch.    Inpatient Medications    . ALPRAZolam  0.5 mg Oral TID  . aspirin EC  81 mg Oral Daily  . chlorhexidine  15 mL Mouth Rinse BID  . cloNIDine  0.2 mg Oral Q8H  . docusate sodium  100 mg Oral BID  . gabapentin  800 mg Oral TID  . insulin aspart  0-9 Units Subcutaneous TID WC  . insulin detemir  10 Units Subcutaneous Daily  . metoCLOPramide (REGLAN) injection  5 mg Intravenous Q6H  . mometasone-formoterol  2 puff Inhalation BID  . morphine   Intravenous Q4H  . pantoprazole  40 mg Oral BID  . tiotropium  18 mcg Inhalation Daily  . venlafaxine XR  150 mg Oral Daily    Family History    Family History  Problem Relation Age of Onset  . Cancer Mother 54       lung  . Thyroid disease Mother   . Cancer Father 86       Pancreatic  . Hypertension Sister   . Cancer Sister        "cancer on face"  . Hyperlipidemia Sister   . Osteoporosis Maternal Grandmother   . Hyperlipidemia Son   . Seizures Son   . Cancer Paternal Grandmother        colon  . Arthritis Paternal Grandfather   . Breast cancer Neg Hx     Social History    Social History   Social History  . Marital status: Legally Separated    Spouse name: N/A  . Number of children: N/A  . Years of education: N/A   Occupational History  . Not on file.   Social History Main Topics  . Smoking status: Former Smoker    Types: Cigarettes    Quit date: 04/30/2004  . Smokeless tobacco: Never Used  . Alcohol use No  . Drug use: No  . Sexual activity: Not Currently    Birth control/ protection: Post-menopausal   Other Topics Concern  . Not on file   Social History Narrative  . No narrative on file     Review of Systems    General:  No chills, fever, night sweats or weight changes.  Cardiovascular:   No chest pain, +++ dyspnea, no edema, orthopnea, palpitations, paroxysmal nocturnal dyspnea. Dermatological: No rash, lesions/masses Respiratory: No cough, +++ dyspnea Urologic: No hematuria, dysuria Abdominal:   +++ nausea, vomiting prior to admission, no diarrhea, bright red blood per rectum, melena, or hematemesis Neurologic:  Confused on admission.  No visual changes,  wkns. All other systems reviewed and are otherwise negative except as noted above.  Physical Exam    Blood pressure (!) 105/54, pulse (!) 55, temperature 98 F (36.7 C), temperature source Axillary, resp. rate 19, height 4\' 10"  (1.473 m), weight 158 lb 11.7 oz (72 kg), SpO2 93 %.  General: Pleasant, NAD Psych: Normal affect. Neuro: Alert and oriented X 3. Moves all extremities spontaneously. HEENT: Normal  Neck: Supple without bruits or JVD. Lungs:  Resp regular and unlabored, CTA. Heart: RRR no s3, s4, or murmurs. Abdomen: Soft, non-tender, non-distended, BS + x 4.  Extremities: No clubbing, cyanosis or edema. DP/PT/Radials 2+ and equal bilaterally.  Labs     Recent Labs  04/25/17 0820 04/27/17 1341  TROPONINI <0.03 0.39*   Lab Results  Component Value Date   WBC 15.6 (H) 04/27/2017   HGB 11.5 (L) 04/27/2017   HCT 34.0 (L) 04/27/2017   MCV 88.6 04/27/2017   PLT 333 04/27/2017     Recent Labs Lab 04/26/17 0505 04/27/17 0405  NA 139 140  K 3.9 4.0  CL 107 111  CO2 24 21*  BUN 11 13  CREATININE 0.70 0.54  CALCIUM 8.4* 7.6*  PROT 7.3  --   BILITOT 0.6  --   ALKPHOS 49  --   ALT 13*  --   AST 25  --   GLUCOSE 215* 269*   Lab Results  Component Value Date   CHOL 129 11/03/2016   HDL 59 11/03/2016   LDLCALC 56 11/03/2016   TRIG 69 11/03/2016     Radiology Studies    Dg Chest 2 View  Result Date: 04/25/2017 CLINICAL DATA:  History of breast cancer. EXAM: CHEST  2 VIEW COMPARISON:  March 02, 2017 FINDINGS: The heart size and mediastinal contours are within normal limits. Both lungs are  clear. The visualized skeletal structures are unremarkable. IMPRESSION: No active cardiopulmonary disease. Electronically Signed   By: Dorise Bullion III M.D   On: 04/25/2017 11:35   US Abdomen Complete  Result Date: 04/20/2017 CLINICAL DATA:  Nausea, diarrhea. EXAM: ABDOMEN ULTRASOUND COMPLETE COMPARISON:  CT scan of May 25, 2009. FINDINGS: Gallbladder: No gallstones or wall thickening visualized. No sonographic Murphy sign noted by sonographer. Common bile duct: Diameter: 3 mm which is within normal limits. Liver: No focal lesion identified. Increased echogenicity of hepatic parenchyma is noted. Portal vein is patent on color Doppler imaging with normal direction of blood flow towards the liver. IVC: No abnormality visualized. Pancreas: Visualized portion unremarkable. Spleen: Size and appearance within normal limits. Right Kidney: Length: 11.8 cm. 4.5 cm simple cyst is seen in upper pole. Echogenicity within normal limits. No mass or hydronephrosis visualized. Left Kidney: Length: 10.9 cm. Echogenicity within normal limits. No mass or hydronephrosis visualized. Abdominal aorta: No aneurysm visualized. Other findings: None. IMPRESSION: Increased echogenicity of hepatic parenchyma is noted suggesting fatty infiltration. 4.5 cm right renal cyst. No other abnormality seen in the abdomen. Electronically Signed   By: Marijo Conception, M.D.   On: 04/20/2017 09:16    ECG & Cardiac Imaging    Sinus brady, 57, ant infarct, inf, antlat deep TWI (all leads except aVR and V1)  Assessment & Plan    1.  NSTEMI: Pt presented with worsening back pain, n, v, confusion in the setting of intrathecal pump failure.  She has been restless and mildly dyspneic since admission by her report.  She was noted earlier today to have significant TWI in lead II on tele  and this was followed by a 12 lead that shows somewhat diffuse inf, antlat deep TWI with poor R progression concerning for prox LAD stenosis.  Trop 0.39.She  denies chest pain and is currently hemodynamically stable.  She is euvolemic on exam.  Echo is pending. Review of tele shows that she began to have T wave abnormalities in lead II on tele @ ~ 2300 on 9/22 and this became more pronounced throughout May 09, 2023.  Heparin and ASA ordered.  Will add high potency statin.  I'm not sure that she will tolerate  blocker as bp tends to be soft.  She has also been intermittently bradycardic.  Plan for cath tomorrow morning or sooner if necessary.  The patient understands that risks include but are not limited to stroke (1 in 1000), death (1 in 74), kidney failure [usually temporary] (1 in 500), bleeding (1 in 200), allergic reaction [possibly serious] (1 in 200), and agrees to proceed. I have also spoken to her dtr re: these plans.  2.  Chronic pain: w/ h/o intrathecal pump s/p failure (battery).  She is pending replacement on Wednesday 9/26 in [place].  She and her family have expressed the importance of this.  I did advise that until cardiac issues have been dealt with that surgeon in [facility] would defer procedure.  If she requires stenting, it would appear at this time that the procedure would have to be done on ASA/Brilinta.  Pain mgmt per CCM/Pain mgmt team.  3.  DM II:  Dx 1999. Insulin per IM.  4.  COPD: no active wheezing.  Signed, Murray Hodgkins, NP 04/27/2017, 4:01 PM  For questions or updates, please contact   Please consult [email or web address] for contact info under Cardiology/STEMI.

## 2017-04-28 NOTE — Interval H&P Note (Signed)
History and Physical Interval Note:  04/28/2017 10:27 AM  Jordan Chan  has presented today for cardiac catheterization, with the diagnosis of NSTEMI. The various methods of treatment have been discussed with the patient and family. After consideration of risks, benefits and other options for treatment, the patient has consented to  Procedure(s): LEFT HEART CATH AND CORONARY ANGIOGRAPHY (N/A) as a surgical intervention .  The patient's history has been reviewed, patient examined, no change in status, stable for surgery.  I have reviewed the patient's chart and labs.  Questions were answered to the patient's satisfaction.    Cath Lab Visit (complete for each Cath Lab visit)  Clinical Evaluation Leading to the Procedure:   ACS: Yes.    Non-ACS:  N/A  Marianne Golightly

## 2017-04-28 NOTE — Progress Notes (Signed)
Pt was brushing teeth with daughters assist.  Became anxious. ST 140-160 on monitor.  Saying it "feels like my chest is drawing up".  SL NTG tried. Dr End notified. 12 lead EKG obtained.  Once evaluated by Dr End and Dr Mortimer Fries her scheduled xanax was given.

## 2017-04-28 NOTE — Progress Notes (Signed)
*  PRELIMINARY RESULTS* Echocardiogram 2D Echocardiogram has been performed.  Jordan Chan 04/28/2017, 9:26 AM

## 2017-04-29 ENCOUNTER — Ambulatory Visit: Payer: Medicare HMO | Admitting: Unknown Physician Specialty

## 2017-04-29 DIAGNOSIS — I428 Other cardiomyopathies: Secondary | ICD-10-CM

## 2017-04-29 DIAGNOSIS — M545 Low back pain: Secondary | ICD-10-CM

## 2017-04-29 DIAGNOSIS — G8929 Other chronic pain: Secondary | ICD-10-CM

## 2017-04-29 DIAGNOSIS — R748 Abnormal levels of other serum enzymes: Secondary | ICD-10-CM

## 2017-04-29 LAB — GLUCOSE, CAPILLARY
Glucose-Capillary: 198 mg/dL — ABNORMAL HIGH (ref 65–99)
Glucose-Capillary: 146 mg/dL — ABNORMAL HIGH (ref 65–99)
Glucose-Capillary: 164 mg/dL — ABNORMAL HIGH (ref 65–99)
Glucose-Capillary: 159 mg/dL — ABNORMAL HIGH (ref 65–99)

## 2017-04-29 MED ORDER — MORPHINE SULFATE ER 15 MG PO TBCR
60.0000 mg | EXTENDED_RELEASE_TABLET | Freq: Two times a day (BID) | ORAL | Status: DC
  Administered 2017-04-29 – 2017-04-30 (×2): 60 mg via ORAL
  Filled 2017-04-29 (×2): qty 4

## 2017-04-29 MED ORDER — HYDROMORPHONE HCL 2 MG PO TABS: 1.0000 mg | ORAL_TABLET | ORAL | Status: DC | PRN

## 2017-04-29 MED ORDER — MORPHINE SULFATE 2 MG/ML IV SOLN
INTRAVENOUS | Status: DC
  Administered 2017-04-30 (×2): 2 mg via INTRAVENOUS
  Filled 2017-04-29 (×2): qty 30

## 2017-04-29 MED ORDER — SENNOSIDES-DOCUSATE SODIUM 8.6-50 MG PO TABS
3.0000 | ORAL_TABLET | Freq: Two times a day (BID) | ORAL | Status: DC
  Administered 2017-04-29 – 2017-05-01 (×4): 3 via ORAL
  Filled 2017-04-29 (×4): qty 3

## 2017-04-29 MED ORDER — ENALAPRIL MALEATE 5 MG PO TABS
2.5000 mg | ORAL_TABLET | Freq: Every day | ORAL | Status: DC
  Administered 2017-04-29 – 2017-04-30 (×2): 2.5 mg via ORAL
  Filled 2017-04-29: qty 1
  Filled 2017-04-29: qty 0.5
  Filled 2017-04-29 (×2): qty 1

## 2017-04-29 MED ORDER — POLYETHYLENE GLYCOL 3350 17 G PO PACK
17.0000 g | PACK | Freq: Every day | ORAL | Status: DC
  Administered 2017-04-30: 17 g via ORAL
  Filled 2017-04-29 (×2): qty 1

## 2017-04-29 MED ORDER — MORPHINE SULFATE ER 60 MG PO TBCR: 60.0000 mg | EXTENDED_RELEASE_TABLET | Freq: Three times a day (TID) | ORAL | Status: DC

## 2017-04-29 NOTE — Progress Notes (Signed)
Pt has remained on 2LNC with NDN. Lung sounds clear, diminished in the lower bases. Pt has remained in NSR on cardiac monitor. Pt has remained A & O x 4 with 8-9/10 back pain that is unrelieved on a Morphine PCA pump. Pt has anxiety as well that is relieved with a xanax.

## 2017-04-29 NOTE — Progress Notes (Signed)
[facility] [place] NAME: Jordan Chan    MR#:  [id number]  DATE OF BIRTH:  [birth year]  SUBJECTIVE:  CHIEF COMPLAINT:   Chief Complaint  Patient presents with  . Pain Pump Malfunction    breast cancer patient    on morphine pump, have malfunction of pump for 2-3 days and came with severe withdrawal symptoms. On precedex drip. Tapering off today. She had some anxiety symptoms.   also c/o some dyspnea symptoms, not much chest tightness.   EKG shows some new T wave inversions and QT prolongations.   Started on heparine drip, cath done- shows stress induced cardiomyopathy.   Today sitting in chair, but very anxious and crying on discussion about pain meds.  REVIEW OF SYSTEMS:   Review of Systems  Constitutional: Negative for chills, fever and weight loss.  HENT: Negative for congestion, ear discharge, ear pain and hearing loss.   Eyes: Negative for blurred vision, double vision and pain.  Respiratory: Positive for shortness of breath. Negative for cough, sputum production and wheezing.   Cardiovascular: Negative for chest pain and palpitations.  Gastrointestinal: Negative for abdominal pain, constipation, nausea and vomiting.  Genitourinary: Negative for dysuria and frequency.  Musculoskeletal: Positive for back pain and myalgias.  Skin: Negative for itching and rash.  Neurological: Negative for dizziness, tremors, focal weakness and seizures.    DRUG ALLERGIES:   Allergies  Allergen Reactions  . Other Palpitations    IV steroids  . Pain Patch [Menthol] Anaphylaxis  . Avelox [Moxifloxacin Hcl In Nacl] Other (See Comments)    Upset stomach  . Erythromycin Nausea Only and Other (See Comments)    Can take a Z-Pak just fine  . Fentanyl   . Oxycontin [Oxycodone] Hives    VITALS:  Blood pressure 110/82, pulse 92, temperature 98.6 F (37 C), temperature source Oral, resp. rate 18, height 4\' 10"  (1.473 m), weight 71.7 kg (158  lb), SpO2 99 %.  PHYSICAL EXAMINATION:  GENERAL:  66 y.o.-year-old patient lying in the bed with no acute distress.  EYES: Pupils equal, round, reactive to light and accommodation. No scleral icterus. HEENT: Head atraumatic, normocephalic. Oropharynx and nasopharynx clear.  NECK:  Supple, no jugular venous distention. No thyroid enlargement, no tenderness.  LUNGS: Normal breath sounds bilaterally, no wheezing, rales,rhonchi or crepitation. No use of accessory   muscles of respiration.  CARDIOVASCULAR: S1, S2 normal. No murmurs, rubs, or gallops.  ABDOMEN: Soft, nontender, nondistended. Bowel sounds present. No organomegaly or mass.  EXTREMITIES: No pedal edema, cyanosis, or clubbing.  NEUROLOGIC: pt is alert, agitated and crying. following commands, moves limbs but due to c/o back pain, does not move much. PSYCHIATRIC: The patient is alert and very anxious.  SKIN: No obvious rash, lesion, or ulcer.   Physical Exam LABORATORY PANEL:   CBC  Recent Labs Lab 04/28/17 0439  WBC 17.8*  HGB 11.8*  HCT 35.6  PLT 383   ------------------------------------------------------------------------------------------------------------------  Chemistries   Recent Labs Lab 04/26/17 0505  04/28/17 0439  NA 139  < > 140  K 3.9  < > 3.7  CL 107  < > 108  CO2 24  < > 25  GLUCOSE 215*  < > 154*  BUN 11  < > 15  CREATININE 0.70  < > 0.58  CALCIUM 8.4*  < > 8.1*  MG  --   --  1.8  AST 25  --   --   ALT 13*  --   --  ALKPHOS 49  --   --   BILITOT 0.6  --   --   < > = values in this interval not displayed. ------------------------------------------------------------------------------------------------------------------  Cardiac Enzymes  Recent Labs Lab 04/28/17 1335 04/28/17 1849  TROPONINI 0.20* 0.21*   ------------------------------------------------------------------------------------------------------------------  RADIOLOGY:  No results found.  ASSESSMENT AND PLAN:    Principal Problem:   Opioid withdrawal (HCC) Active Problems:   Chronic back pain   Intractable nausea and vomiting   Acute encephalopathy   Opiate withdrawal (HCC)   Non-ST elevation (NSTEMI) myocardial infarction (HCC)  * severe opioid withdrawal   IV precedex drip.   Manage per intensivist   On Mprphin PCA drip.    Requiring banzos to help anxiety.   As mentioned, I spoke to her pain management physician, he suggest- Morphin 60 mg long acting TID.   ( depending on her use from last 24 hrs) and oral meds for breakthrough pains.    Pt is right now not ready for oral meds, and started crying just listening of this plan of oral meds.  * opioid pump malfunction   For last 10 years on morphin pump.   Pain management clinic consult.   Started on morphine to help.   I also spoke to his pain clinic and left a msg with receptionist and told her to call me back and let me   speak to fellow or the doctor. They were working on getting  Her pump battey replaced.   I did receive on hospital phone in after hours, which I missed. Called today again and provided my cellphone number to call me back to discuss the case.  04/29/17- I was able to talk to Dr. Marcha Solders, plan as mentioned earlier. He is out of town, so can do change her pump next week. We may either send her home or transfer her next week to his care.  * Dyspnea, elevated troponin, EKG changes.   NSTEMI suspected- now ruled out, but found to have stress induced cardiomyopathy on Cath.    Appreciated urgent help of cardio    on ASA.   Low dose betablocker and lasix.    Once blood pressure allows- ACE inhibitors.   * Severe anxiety   Xanax now.    Psych consult to help.  * COPD    No exacerbation    Cont Inhalers and nebs.  * Hypertension   Hold meds due to hypotension  * DM    As due to sedation, No oral intake.   Keep on ISS, no long acting insuline now.    All the records are reviewed and case discussed with Care  Management/Social Workerr. Management plans discussed with the patient, family and they are in agreement.  CODE STATUS: Full.  TOTAL TIME TAKING CARE OF THIS PATIENT: 40 minutes.   Spoke to pt's daughter in room, her pain clinic at [facility] and with cardiologist.  POSSIBLE D/C IN 2-3 DAYS, DEPENDING ON CLINICAL CONDITION.   Vaughan Basta M.D on 04/29/2017   Between 7am to 6pm - Pager - [phone]  After 6pm go to [email or web address] - password [email or web address] [facility]  Office  [phone]  CC: Primary care physician; Kathrine Haddock, NP  Note: This dictation was prepared with Dragon dictation along with smaller phrase technology. Any transcriptional errors that result from this process are unintentional.

## 2017-04-29 NOTE — Progress Notes (Signed)
[place] for constipation management  Indication: chronic opioid use   Allergies  Allergen Reactions  . Other Palpitations    IV steroids  . Pain Patch [Menthol] Anaphylaxis  . Avelox [Moxifloxacin Hcl In Nacl] Other (See Comments)    Upset stomach  . Erythromycin Nausea Only and Other (See Comments)    Can take a Z-Pak just fine  . Fentanyl   . Oxycontin [Oxycodone] Hives    Patient Measurements: Height: 4\' 10"  (147.3 cm) Weight: 158 lb (71.7 kg) IBW/kg (Calculated) : 40.9  Vital Signs: Temp: 98.6 F (37 C) (09/26 1200) Temp Source: Oral (09/26 1200) BP: 117/75 (09/26 1500) Pulse Rate: 92 (09/26 1057) Intake/Output from previous day: 09/25 0701 - 09/26 0700 In: 349 [P.O.:120; I.V.:229] Out: 3615 [Urine:3615] Intake/Output from this shift: No intake/output data recorded.  Labs:  Recent Labs  04/27/17 0405 04/28/17 0037 04/28/17 0439  WBC 15.6*  --  17.8*  HGB 11.5*  --  11.8*  HCT 34.0*  --  35.6  PLT 333  --  383  APTT  --  76*  --   CREATININE 0.54  --  0.58  MG  --   --  1.8   Estimated Creatinine Clearance: 57.3 mL/min (by C-G formula based on SCr of 0.58 mg/dL).    Assessment: Pharmacy consulted for constipation management for 66 yo female on chronic opioids. Patient is currently managed with mix of morphine PCA and oral morphine. Patient initiated on diet 9/26 and had no documented bowel movement.    Plan:  Will start docusate/senna 3 tabs BID. Will start Miralax daily starting 9/27.   Pharmacy will continue to monitor and adjust per consult.   Simpson,Michael L 04/29/2017,9:26 PM

## 2017-04-29 NOTE — Progress Notes (Signed)
Dr. Nichola Sizer note appreciated, will place order for MS Contin 60 mg oral every 12, with PCA on demand morphine at 1.5 mg, maximum 7.5 mg per hour, with no basal infusion rate.  Marda Stalker, MD.   Board Certified in Internal Medicine, Pulmonary Medicine, [place], and Sleep Medicine.  [facility] Office Number: [phone] Pager: [phone]  [name] Pesa, M.D.  Merton Border, M.D

## 2017-04-29 NOTE — Progress Notes (Signed)
Since Monday, I learned, that we are not able to Fix her pain medicine pump here, I had tried to work on her transfer. I received the number for the pain clinic and physician from her daughter. Called and left a message to clinic regarding her. Today on my call back, I was connected to Dr. Marcha Solders. He gave me information about pt, and informed me about his expressed readiness on past Monday to accept the pt at [facility] for the procedure, which I was not aware until today. He is out of town and can not do that until next week now. He suggested me the meantime management with oral, and suggested to give MS contin oral 60 mg TID and oral dilaudid or morphine for breakthrough pain. We can discharge, and they can call her for fixing her pump in next week.  Pt is very anxious and her daughter and husband are upset to wait until next week. I have called Dr. Marcha Solders again, and asked to get any other physician , who can do this procedure.  He have provided me the contact number of the local agent from [employer] for the pump, who might know other physicians , who can help with this. I spoke to Luisa [phone] ( [employer]) and she will speak to other local physicians and let me know.  Time spent in this is 90 min.

## 2017-04-29 NOTE — Progress Notes (Signed)
[facility] [place] NAME: Jordan Chan    MR#:  [id number]  DATE OF BIRTH:  [birth year]  SUBJECTIVE:  CHIEF COMPLAINT:   Chief Complaint  Patient presents with  . Pain Pump Malfunction    breast cancer patient    on morphine pump, have malfunction of pump for 2-3 days and came with severe withdrawal symptoms. On precedex drip. Tapering off today. She had some anxiety symptoms.   also c/o some dyspnea symptoms, not much chest tightness.   EKG shows some new T wave inversions and QT prolongations.   Started on heparine drip, cath done- shows stress induced cardiomyopathy.  REVIEW OF SYSTEMS:   Review of Systems  Constitutional: Negative for chills, fever and weight loss.  HENT: Negative for congestion, ear discharge, ear pain and hearing loss.   Eyes: Negative for blurred vision, double vision and pain.  Respiratory: Positive for shortness of breath. Negative for cough, sputum production and wheezing.   Cardiovascular: Negative for chest pain and palpitations.  Gastrointestinal: Negative for abdominal pain, constipation, nausea and vomiting.  Genitourinary: Negative for dysuria and frequency.  Musculoskeletal: Positive for back pain and myalgias.  Skin: Negative for itching and rash.  Neurological: Negative for dizziness, tremors, focal weakness and seizures.    DRUG ALLERGIES:   Allergies  Allergen Reactions  . Other Palpitations    IV steroids  . Pain Patch [Menthol] Anaphylaxis  . Avelox [Moxifloxacin Hcl In Nacl] Other (See Comments)    Upset stomach  . Erythromycin Nausea Only and Other (See Comments)    Can take a Z-Pak just fine  . Fentanyl   . Oxycontin [Oxycodone] Hives    VITALS:  Blood pressure (!) 114/56, pulse 92, temperature 97.9 F (36.6 C), temperature source Axillary, resp. rate 18, height 4\' 10"  (1.473 m), weight 71.7 kg (158 lb), SpO2 99 %.  PHYSICAL EXAMINATION:  GENERAL:  66 y.o.-year-old patient  lying in the bed with no acute distress.  EYES: Pupils equal, round, reactive to light and accommodation. No scleral icterus. HEENT: Head atraumatic, normocephalic. Oropharynx and nasopharynx clear.  NECK:  Supple, no jugular venous distention. No thyroid enlargement, no tenderness.  LUNGS: Normal breath sounds bilaterally, no wheezing, rales,rhonchi or crepitation. No use of accessory   muscles of respiration.  CARDIOVASCULAR: S1, S2 normal. No murmurs, rubs, or gallops.  ABDOMEN: Soft, nontender, nondistended. Bowel sounds present. No organomegaly or mass.  EXTREMITIES: No pedal edema, cyanosis, or clubbing.  NEUROLOGIC: pt is drowsy but easily arousable and some what agitated when aroused. following commands, moves limbs but due to c/o back pain, does not move much. PSYCHIATRIC: The patient is drowsy, but arousable and some irritated or anxious due to pain .  SKIN: No obvious rash, lesion, or ulcer.   Physical Exam LABORATORY PANEL:   CBC  Recent Labs Lab 04/28/17 0439  WBC 17.8*  HGB 11.8*  HCT 35.6  PLT 383   ------------------------------------------------------------------------------------------------------------------  Chemistries   Recent Labs Lab 04/26/17 0505  04/28/17 0439  NA 139  < > 140  K 3.9  < > 3.7  CL 107  < > 108  CO2 24  < > 25  GLUCOSE 215*  < > 154*  BUN 11  < > 15  CREATININE 0.70  < > 0.58  CALCIUM 8.4*  < > 8.1*  MG  --   --  1.8  AST 25  --   --   ALT 13*  --   --  ALKPHOS 49  --   --   BILITOT 0.6  --   --   < > = values in this interval not displayed. ------------------------------------------------------------------------------------------------------------------  Cardiac Enzymes  Recent Labs Lab 04/28/17 1335 04/28/17 1849  TROPONINI 0.20* 0.21*   ------------------------------------------------------------------------------------------------------------------  RADIOLOGY:  No results found.  ASSESSMENT AND PLAN:    Principal Problem:   Opioid withdrawal (HCC) Active Problems:   Chronic back pain   Intractable nausea and vomiting   Acute encephalopathy   Opiate withdrawal (HCC)   Non-ST elevation (NSTEMI) myocardial infarction (HCC)  * severe opioid withdrawal   IV precedex drip.   Manage per intensivist   On Mprphin PCA drip.    Requiring banzos to help anxiety.  * opioid pump malfunction   For last 10 years on morphin pump.   Pain management clinic consult.   Started on morphine to help.   I also spoke to his pain clinic and left a msg with receptionist and told her to call me back and let me   speak to fellow or the doctor. They were working on getting  Her pump battey replaced.   I did receive on hospital phone in after hours, which I missed. Called today again and provided my cellphone number to call me back to discuss the case.  * Dyspnea, elevated troponin, EKG changes.   NSTEMI suspected- now ruled out, but found to have stress induced cardiomyopathy on Cath.    Appreciated urgent help of cardio    on ASA.   Low dose betablocker and lasix.    Once blood pressure allows- ACE inhibitors.   * COPD    No exacerbation    Cont Inhalers and nebs.  * Hypertension   Hold meds due to hypotension  * DM    As due to sedation, No oral intake.   Keep on ISS, no long acting insuline now.    All the records are reviewed and case discussed with Care Management/Social Workerr. Management plans discussed with the patient, family and they are in agreement.  CODE STATUS: Full.  TOTAL TIME TAKING CARE OF THIS PATIENT: 40 minutes.   Spoke to pt's daughter in room, her pain clinic at [facility] and with cardiologist.  POSSIBLE D/C IN 2-3 DAYS, DEPENDING ON CLINICAL CONDITION.   Vaughan Basta M.D on 04/29/2017   Between 7am to 6pm - Pager - [phone]  After 6pm go to [email or web address] - password [email or web address] [facility]  Office  [phone]  CC: Primary  care physician; Kathrine Haddock, NP  Note: This dictation was prepared with Dragon dictation along with smaller phrase technology. Any transcriptional errors that result from this process are unintentional.

## 2017-04-29 NOTE — Progress Notes (Signed)
Progress Note  Patient Name: Jordan Chan Date of Encounter: 04/29/2017  Primary Cardiologist: Jerilynn Mages. Fletcher Anon, MD   Subjective   Ongoing back pain and general restlessness.  No c/p or dyspnea.    Inpatient Medications    Scheduled Meds: . ALPRAZolam  0.5 mg Oral TID  . aspirin EC  81 mg Oral Daily  . atorvastatin  80 mg Oral q1800  . chlorhexidine  15 mL Mouth Rinse BID  . cloNIDine  0.2 mg Oral Q8H  . docusate sodium  100 mg Oral BID  . enoxaparin (LOVENOX) injection  40 mg Subcutaneous Q24H  . furosemide  20 mg Intravenous Daily  . gabapentin  800 mg Oral TID  . insulin aspart  0-9 Units Subcutaneous TID WC  . insulin detemir  10 Units Subcutaneous Daily  . metoCLOPramide (REGLAN) injection  5 mg Intravenous Q6H  . metoprolol succinate  12.5 mg Oral Daily  . mometasone-formoterol  2 puff Inhalation BID  . morphine  60 mg Oral Q8H  . morphine   Intravenous Q4H  . pantoprazole  40 mg Oral BID  . sodium chloride flush  3 mL Intravenous Q12H  . tiotropium  18 mcg Inhalation Daily  . venlafaxine XR  150 mg Oral Daily   Continuous Infusions: . sodium chloride 20 mL/hr at 04/27/17 1647  . sodium chloride 250 mL (04/28/17 1800)   PRN Meds: sodium chloride, acetaminophen **OR** acetaminophen, albuterol, bisacodyl, diphenhydrAMINE **OR** diphenhydrAMINE, HYDROmorphone, LORazepam, naloxone **AND** sodium chloride flush, ondansetron **OR** ondansetron (ZOFRAN) IV, sodium chloride flush   Vital Signs    Vitals:   04/29/17 0800 04/29/17 0802 04/29/17 0900 04/29/17 1057  BP: 130/75 130/75 104/60 102/82  Pulse:    92  Resp: (!) 23  17   Temp: 98.6 F (37 C)     TempSrc: Oral     SpO2: 100%     Weight:      Height:        Intake/Output Summary (Last 24 hours) at 04/29/17 1103 Last data filed at 04/29/17 1000  Gross per 24 hour  Intake           228.69 ml  Output             3475 ml  Net         -3246.31 ml   Filed Weights   04/25/17 1700 04/26/17 0500 04/28/17  0948  Weight: 151 lb 10.8 oz (68.8 kg) 158 lb 11.7 oz (72 kg) 158 lb (71.7 kg)    Physical Exam   GEN: Well nourished, well developed, in no acute distress.  HEENT: Grossly normal.  Neck: Supple, no JVD, carotid bruits, or masses. Cardiac: RRR, no murmurs, rubs, or gallops. No clubbing, cyanosis, edema.  Radials/DP/PT 2+ and equal bilaterally. R wrist cath site w/o bleeding/bruit/hematoma. Respiratory:  Respirations regular and unlabored, clear to auscultation bilaterally. GI: Soft, nontender, nondistended, BS + x 4. MS: no deformity or atrophy. Skin: warm and dry, no rash. Neuro:  Strength and sensation are intact. Psych: AAOx3.  Normal affect.  Labs    Chemistry  Recent Labs Lab 04/25/17 0820 04/26/17 0505 04/27/17 0405 04/28/17 0439  NA 138 139 140 140  K 3.8 3.9 4.0 3.7  CL 101 107 111 108  CO2 27 24 21* 25  GLUCOSE 210* 215* 269* 154*  BUN 11 11 13 15   CREATININE 0.63 0.70 0.54 0.58  CALCIUM 9.7 8.4* 7.6* 8.1*  PROT 8.8* 7.3  --   --  ALBUMIN 4.2 3.3*  --   --   AST 25 25  --   --   ALT 16 13*  --   --   ALKPHOS 61 49  --   --   BILITOT 0.6 0.6  --   --   GFRNONAA >60 >60 >60 >60  GFRAA >60 >60 >60 >60  ANIONGAP 10 8 8 7      Hematology  Recent Labs Lab 04/26/17 0505 04/27/17 0405 04/28/17 0439  WBC 20.8* 15.6* 17.8*  RBC 4.44 3.84 4.02  HGB 13.0 11.5* 11.8*  HCT 39.2 34.0* 35.6  MCV 88.2 88.6 88.5  MCH 29.4 30.0 29.3  MCHC 33.3 33.9 33.1  RDW 15.8* 15.8* 16.3*  PLT 422 333 383    Cardiac Enzymes  Recent Labs Lab 04/28/17 0037 04/28/17 0652 04/28/17 1335 04/28/17 1849  TROPONINI 0.27* 0.25* 0.20* 0.21*     Radiology    No results found.  Telemetry    RSR - sinus tach - Personally Reviewed   Cardiac Studies   Study Conclusions  - Left ventricle: The cavity size was at the upper limits of   normal. Wall thickness was normal. Systolic function was severely   reduced. The estimated ejection fraction was in the range of 25%    to 30%. There is severe hypokinesis of the mid-apical myocardium.   The study is not technically sufficient to allow evaluation of LV   diastolic function. - Left atrium: The atrium was mildly dilated. - Right ventricle: Poorly visualized. The cavity size was normal. _____________  Conclusions: 1. No angiographically significant coronary artery disease. 2. Severely reduced LV contraction (LVEF 25-30%) with mid and apical akinesis, consistent with Takotsubo (stress-induced) cardiomyopathy. 3. Mildly elevated left ventricular filling pressure.  Recommendations: 1. Medical therapy, including initiation of metoprolol succinate 12.5 mg daily and furosemide 20 mg IV daily. If blood pressure tolerates, consider addition of low-dose ACE inhibitor tomorrow. 2. Continue treatment of pain and opioid withdrawal.  Patient Profile     66 y.o. female wth a h/o chronic lbp, DM, rem tob abuse, COPD, OSA, and breast cancer who presented due to intrathecal pump failure, pain, mso4 w/d, and was found to have an abnl ecg and trop. Cath  nl cors, EF 25-30%  takotsubo CM.  Assessment & Plan    1.  Takotsubo CM/acute systolic CHF: Cath 5/18 revealed nl cors w/ LV dysfxn and apical ballooning consistent w/ takotsubo CM.  Filling pressures mildly elevated and lasix added yesterday.  No c/p or sob. No significant volume excess on exam.  I/O +295.  Will look to transition lasix to PO. Cont toprol xl.  BP too soft currently for ARB/ACEI/ARNI/MRA.  She is on clonidine for pain (was receiving intrathecally) and this is no doubt impacting bp.  Can readdress meds with hope of adding ARB once intrathecal pump gen change completed (was supposed to occur today).  2.  Chronic Pain:  S/p intrathecal pump failure. Currently on mso4 gtt and po clonidine.  Tearful at times.  Was supposed to have pump replaced today.  In setting of above, not likely ideal for her to undergo general anesthesia (she says that that is what is required)  at this point, but it does not appear that she would be able to wait until LV fxn normalized.  That cors are nl reduces risk of periop MI.  Bigger issue will be periop CHF, thus procedure should likely be performed as an inpt.  Signed, Murray Hodgkins, NP  04/29/2017, 11:03 AM    For questions or updates, please contact   Please consult [email or web address] for contact info under Cardiology/STEMI.  Attending Note Patient seen and examined, agree with detailed note above,  Patient presentation and plan discussed on rounds.   Reports having significant stress concerning her pain medication regiment Tearful at times during discussion Other times sitting without any pain, conversant Reports that she is not eating well Would like to walk with a walker Sitting in a recliner  On physical exam lungs are clear, no JVD, heart sounds regular, abdomen soft nontender no significant lower extremity edema  Lab work reviewed showing stable troponin 0.2 Creatinine 0.6, potassium 3.5, hematocrit 35  Cardiac catheterization results reviewed with her showing no coronary disease, severely depressed ejection fraction 25-30%, Stress cardiomyopathy Results discussed with her and family at the bedside  --- Nonischemic cardiomyopathy Low blood pressure limiting advancement of medication She is on metoprolol succinate, Lasix Close monitoring of renal function We'll add low-dose ACE inhibitor, 2.5 mg tonight and in the morning If tolerated could advance to 5 mg on Friday Aldactone at a later date  --- Chronic pain Intrathecal pump failure Medicine service trying to advance her oral regiment On morphine infusion PCA  Greater than 50% was spent in counseling and coordination of care with patient Total encounter time 35 minutes or more   Signed : Esmond Plants  M.D., Ph.D. [facility]

## 2017-04-30 ENCOUNTER — Encounter: Payer: Self-pay | Admitting: Pain Medicine

## 2017-04-30 DIAGNOSIS — F4323 Adjustment disorder with mixed anxiety and depressed mood: Secondary | ICD-10-CM

## 2017-04-30 LAB — CULTURE, BLOOD (ROUTINE X 2)
Culture: NO GROWTH
Culture: NO GROWTH
Special Requests: ADEQUATE
Special Requests: ADEQUATE

## 2017-04-30 LAB — GLUCOSE, CAPILLARY
Glucose-Capillary: 226 mg/dL — ABNORMAL HIGH (ref 65–99)
Glucose-Capillary: 199 mg/dL — ABNORMAL HIGH (ref 65–99)
Glucose-Capillary: 264 mg/dL — ABNORMAL HIGH (ref 65–99)
Glucose-Capillary: 130 mg/dL — ABNORMAL HIGH (ref 65–99)

## 2017-04-30 MED ORDER — FUROSEMIDE 20 MG PO TABS
20.0000 mg | ORAL_TABLET | Freq: Every day | ORAL | Status: DC
  Administered 2017-05-01: 20 mg via ORAL
  Filled 2017-04-30: qty 1

## 2017-04-30 MED ORDER — MORPHINE SULFATE (PF) 2 MG/ML IV SOLN
2.0000 mg | INTRAVENOUS | Status: DC | PRN
  Administered 2017-05-01: 2 mg via INTRAVENOUS
  Filled 2017-04-30: qty 1

## 2017-04-30 MED ORDER — MORPHINE SULFATE ER 15 MG PO TBCR
60.0000 mg | EXTENDED_RELEASE_TABLET | Freq: Three times a day (TID) | ORAL | Status: DC
  Administered 2017-04-30 – 2017-05-01 (×3): 60 mg via ORAL
  Filled 2017-04-30 (×3): qty 4

## 2017-04-30 NOTE — Progress Notes (Signed)
[facility] NAME: Jordan Chan    MR#:  [id number]  DATE OF BIRTH:  [birth year]  SUBJECTIVE:  CHIEF COMPLAINT:   Chief Complaint  Patient presents with  . Pain Pump Malfunction    breast cancer patient    on morphine pump, have malfunction of pump for 2-3 days and came with severe withdrawal symptoms. On precedex drip. Tapering off today. She had some anxiety symptoms.   also c/o some dyspnea symptoms, not much chest tightness.   EKG shows some new T wave inversions and QT prolongations.   Started on heparine drip, cath done- shows stress induced cardiomyopathy.   Stopped PCA drip, and started on Morphine Extended release tabs today. Still anxious.  REVIEW OF SYSTEMS:   Review of Systems  Constitutional: Negative for chills, fever and weight loss.  HENT: Negative for congestion, ear discharge, ear pain and hearing loss.   Eyes: Negative for blurred vision, double vision and pain.  Respiratory: Positive for shortness of breath. Negative for cough, sputum production and wheezing.   Cardiovascular: Negative for chest pain and palpitations.  Gastrointestinal: Negative for abdominal pain, constipation, nausea and vomiting.  Genitourinary: Negative for dysuria and frequency.  Musculoskeletal: Positive for back pain and myalgias.  Skin: Negative for itching and rash.  Neurological: Negative for dizziness, tremors, focal weakness and seizures.    DRUG ALLERGIES:   Allergies  Allergen Reactions  . Other Palpitations    IV steroids  . Pain Patch [Menthol] Anaphylaxis  . Avelox [Moxifloxacin Hcl In Nacl] Other (See Comments)    Upset stomach  . Erythromycin Nausea Only and Other (See Comments)    Can take a Z-Pak just fine  . Fentanyl   . Oxycontin [Oxycodone] Hives    VITALS:  Blood pressure 109/70, pulse 98, temperature 97.8 F (36.6 C), temperature source Oral, resp. rate 20, height 4\' 10"  (1.473 m), weight 68.6 kg  (151 lb 3.8 oz), SpO2 95 %.  PHYSICAL EXAMINATION:  GENERAL:  66 y.o.-year-old patient lying in the bed with no acute distress.  EYES: Pupils equal, round, reactive to light and accommodation. No scleral icterus. HEENT: Head atraumatic, normocephalic. Oropharynx and nasopharynx clear.  NECK:  Supple, no jugular venous distention. No thyroid enlargement, no tenderness.  LUNGS: Normal breath sounds bilaterally, no wheezing, rales,rhonchi or crepitation. No use of accessory   muscles of respiration.  CARDIOVASCULAR: S1, S2 normal. No murmurs, rubs, or gallops.  ABDOMEN: Soft, nontender, nondistended. Bowel sounds present. No organomegaly or mass.  EXTREMITIES: No pedal edema, cyanosis, or clubbing.  NEUROLOGIC: pt is alert, agitated and crying. following commands, moves limbs but due to c/o back pain, does not move much. PSYCHIATRIC: The patient is alert and very anxious.  SKIN: No obvious rash, lesion, or ulcer.   Physical Exam LABORATORY PANEL:   CBC  Recent Labs Lab 04/28/17 0439  WBC 17.8*  HGB 11.8*  HCT 35.6  PLT 383   ------------------------------------------------------------------------------------------------------------------  Chemistries   Recent Labs Lab 04/26/17 0505  04/28/17 0439  NA 139  < > 140  K 3.9  < > 3.7  CL 107  < > 108  CO2 24  < > 25  GLUCOSE 215*  < > 154*  BUN 11  < > 15  CREATININE 0.70  < > 0.58  CALCIUM 8.4*  < > 8.1*  MG  --   --  1.8  AST 25  --   --   ALT 13*  --   --  ALKPHOS 49  --   --   BILITOT 0.6  --   --   < > = values in this interval not displayed. ------------------------------------------------------------------------------------------------------------------  Cardiac Enzymes  Recent Labs Lab 04/28/17 1335 04/28/17 1849  TROPONINI 0.20* 0.21*   ------------------------------------------------------------------------------------------------------------------  RADIOLOGY:  No results found.  ASSESSMENT AND  PLAN:   Principal Problem:   Opioid withdrawal (HCC) Active Problems:   Chronic back pain   Intractable nausea and vomiting   Acute encephalopathy   Opiate withdrawal (HCC)   Non-ST elevation (NSTEMI) myocardial infarction (HCC)  * severe opioid withdrawal   IV precedex drip.   Manage per intensivist   On Mprphin PCA drip.    Requiring banzos to help anxiety.   As mentioned, I spoke to her pain management physician, he suggest- Morphin 60 mg long acting TID.   ( depending on her use from last 24 hrs) and oral meds for breakthrough pains.    Pt is today ready to start oral meds, started on MS contin 60 mg TID.  * opioid pump malfunction   For last 10 years on morphin pump.   Pain management clinic consult.   Started on morphine to help.   I also spoke to his pain clinic and left a msg with receptionist and told her to call me back and let me   speak to fellow or the doctor. They were working on getting  Her pump battey replaced.   I did receive on hospital phone in after hours, which I missed. Called today again and provided my cellphone number to call me back to discuss the case.  04/29/17- I was able to talk to Dr. Marcha Solders, plan as mentioned earlier. He is out of town, so can do change her pump next week. We may either send her home or transfer her next week to his care.  Today on MS CONTIN and stopped morphine IV drip.  * Dyspnea, elevated troponin, EKG changes.   NSTEMI suspected- now ruled out, but found to have stress induced cardiomyopathy on Cath.    Appreciated urgent help of cardio    on ASA.   Low dose betablocker and lasix.    Once blood pressure allows- ACE inhibitors.   * Severe anxiety   Xanax now.    Psych consult to help.  * COPD    No exacerbation    Cont Inhalers and nebs.  * Hypertension   Hold meds due to hypotension  * DM    As due to sedation, No oral intake.   Keep on ISS, no long acting insuline now.    All the records are reviewed and case  discussed with Care Management/Social Workerr. Management plans discussed with the patient, family and they are in agreement.  CODE STATUS: Full.  TOTAL TIME TAKING CARE OF THIS PATIENT: 40 minutes.   Spoke to pt's daughter in room.  POSSIBLE D/C IN 2-3 DAYS, DEPENDING ON CLINICAL CONDITION.   Vaughan Basta M.D on 04/30/2017   Between 7am to 6pm - Pager - [phone]  After 6pm go to [email or web address] - password [email or web address] [facility]  Office  [phone]  CC: Primary care physician; Kathrine Haddock, NP  Note: This dictation was prepared with Dragon dictation along with smaller phrase technology. Any transcriptional errors that result from this process are unintentional.

## 2017-04-30 NOTE — Consult Note (Signed)
[facility] Face-to-Face Psychiatry Consult   Reason for Consult:  Consult for 66 year old woman with a history of chronic pain. Consult for anxiety and sad mood or tearfulness Referring Physician:  Marthann Schiller Patient Identification: Jordan Chan MRN:  [id number] Principal Diagnosis: Adjustment disorder with mixed anxiety and depressed mood Diagnosis:   Patient Active Problem List   Diagnosis Date Noted  . Adjustment disorder with mixed anxiety and depressed mood [F43.23] 04/30/2017  . Non-ST elevation (NSTEMI) myocardial infarction ([place]) [I21.4]   . Presence of intrathecal pump [Z96.89] 04/27/2017  . End of battery life of intrathecal infusion pump [Z46.2] 04/27/2017  . Opioid withdrawal ([place]) [F11.23] 04/25/2017  . Intractable nausea and vomiting [R11.2] 04/25/2017  . Acute encephalopathy [G93.40] 04/25/2017  . Opiate withdrawal ([place]) [F11.23] 04/25/2017  . Tachycardia [R00.0] 04/13/2017  . Pain in both hands [M79.641, M79.642] 02/16/2017  . Pain involving joint of finger of left hand [M25.542] 12/15/2016  . Advanced care planning/counseling discussion [Z71.89] 11/14/2016  . Anemia [D64.9] 11/04/2016  . Cervical scoliosis [M41.9] 05/20/2016  . Torticollis [M43.6] 04/28/2016  . Height loss [R29.890] 01/21/2016  . Nocturnal oxygen desaturation [G47.34] 10/01/2015  . Benign hypertension with chronic kidney disease [I12.9] 06/18/2015  . B12 deficiency [E53.8] 03/19/2015  . Skin yeast infection [B37.2] 03/19/2015  . Sleep apnea [G47.30] 11/16/2014  . Anxiety [F41.9] 11/16/2014  . Allergic rhinitis [J30.9] 11/16/2014  . Malignant neoplasm of right breast ([place]) [C50.911] 11/16/2014  . GERD (gastroesophageal reflux disease) [K21.9] 11/16/2014  . COPD, moderate ([place]) [J44.9] 11/16/2014  . Obesity [E66.9] 11/16/2014  . Major depression [F32.9] 11/16/2014  . Insomnia [G47.00] 11/16/2014  . Chronic back pain [M54.9, G89.29] 11/16/2014  . Diabetic neuropathy ([place]) [E11.40] 11/16/2014  .  Diabetes mellitus with chronic kidney disease ([place]) [E11.22] 11/16/2014  . Chronic kidney disease, stage III (moderate) [N18.3] 11/16/2014  . Benign hypertensive renal disease [I12.9] 11/16/2014  . Hyperlipidemia [E78.5] 11/16/2014  . 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 Low back pain [M54.5] 09/30/2012  . Neuralgia of chest [M79.2] 09/30/2012  . Pain syndrome, chronic [G89.4] 09/30/2012    Total Time spent with patient: 1 hour  Subjective:   Jordan Chan is a 66 y.o. female patient admitted with "I have every reason to be upset".  HPI:  Patient interviewed chart reviewed. 66 year old woman with a history of chronic pain. Consult was ordered apparently because she was tearful and seemed emotionally distressed. Patient reports that last week her morphine pump abruptly stopped working. She tried to get it fixed or replaced but was unable to do so on short notice. This resulted in narcotic withdrawal that did not respond to the prescribed narcotic medication which resulted in hospitalization. Patient admits that she was upset. She says she is still angry about the whole situation but she denies being depressed. Denies sadness. Denies any problems with anxiety or depression before coming into the hospital. No suicidal or homicidal ideation. No psychosis. Patient presents her home life as being fulfilling with no complaints.  Social history: Patient lives with her disabled adult son. She says when her pump is working she is functional at home. Enjoys being around her family and has no complaints.  Medical history: Chronic pain mostly in her neck and back and she is on a implanted morphine pump. History of diabetes COPD asthma past breast cancer  Substance abuse history: Denies use of alcohol or other drugs denies any history of substance abuse problems  Past Psychiatric History: Patient denies any past psychiatric hospitalization. She  has been prescribed Effexor as an  outpatient for depression by primary care doctors but denies any history of suicide attempts or suicidal behavior or psychosis. Never seen a mental health provider.  Risk to Self: Is patient at risk for suicide?: No Risk to Others:   Prior Inpatient Therapy:   Prior Outpatient Therapy:    Past Medical History:  Past Medical History:  Diagnosis Date  . Arthritis   . Asthma   . Breast cancer ([place]) 1998   right breast ca with mastectomy and chemotherapy and radiation  . COPD (chronic obstructive pulmonary disease) ([place])   . Diabetes mellitus without complication ([place])   . Endometriosis   . GERD (gastroesophageal reflux disease)   . IBS (irritable bowel syndrome)   . Low back pain    a. Implanted morphine/bupivicaine/clonidine pump.  . Sleep apnea     Past Surgical History:  Procedure Laterality Date  . ABDOMINAL HYSTERECTOMY  1987  . BACK SURGERY     Tailbone removed following fracture  . COLONOSCOPY WITH PROPOFOL N/A 01/01/2017   Procedure: COLONOSCOPY WITH PROPOFOL;  Surgeon: Jonathon Bellows, MD;  Location: [facility] ENDOSCOPY;  Service: Endoscopy;  Laterality: N/A;  . ELBOW ARTHROSCOPY WITH TENDON RECONSTRUCTION    . ESOPHAGOGASTRODUODENOSCOPY (EGD) WITH PROPOFOL N/A 01/01/2017   Procedure: ESOPHAGOGASTRODUODENOSCOPY (EGD) WITH PROPOFOL;  Surgeon: Jonathon Bellows, MD;  Location[facility] END[facility]COPY;  Service: Endoscopy;  Laterality: N/A;  . LEFT HEART CATH AND CORONARY ANGIOGRAPHY N/A 04/28/2017   Procedure: LEFT HEART CATH AND CORONARY ANGIOGRAPHY;  Surgeon: [name], MD;  Location: [facility] LAB;  Service: Cardiovascular;  Laterality: N/A;  . MASTECTOMY  06/1997  . morphine pump  2011  . PLANTAR FASCIA RELEASE     Family History:  Family History  Problem Relation Age of Onset  . Cancer Mother 50       lung  . Thyroid disease Mother   . Cancer Father 73       Pancreatic  . Hypertension Sister   . Cancer Sister        "cancer on face"  . Hyperlipidemia Sister   .  Osteoporosis Maternal Grandmother   . Hyperlipidemia Son   . Seizures Son   . Cancer Paternal Grandmother        colon  . Arthritis Paternal Grandfather   . Breast cancer Neg Hx    Family Psychiatric  History: Does not know of any Social History:  History  Alcohol Use No     History  Drug Use No    Social History   Social History  . Marital status: Legally Separated    Spouse name: N/A  . Number of children: N/A  . Years of education: N/A   Social History Main Topics  . Smoking status: Former Smoker    Types: Cigarettes    Quit date: 04/30/2004  . Smokeless tobacco: Never Used  . Alcohol use No  . Drug use: No  . Sexual activity: Not Currently    Birth control/ protection: Post-menopausal   Other Topics Concern  . None   Social History Narrative  . None   Additional Social History:    Allergies:   Allergies  Allergen Reactions  . Other Palpitations    IV steroids  . Pain Patch [Menthol] Anaphylaxis  . Avelox [Moxifloxacin Hcl In Nacl] Other (See Comments)    Upset stomach  . Erythromycin Nausea Only and Other (See Comments)    Can take a Z-Pak just fine  . Fentanyl   .  Oxycontin [Oxycodone] Hives    Labs:  Results for orders placed or performed during the hospital encounter of 04/25/17 (from the past 48 hour(s))  Troponin I (q 6hr x 3)     Status: Abnormal   Collection Time: 04/28/17  6:49 PM  Result Value Ref Range   Troponin I 0.21 (HH) <0.03 ng/mL    Comment: CRITICAL VALUE NOTED. VALUE IS CONSISTENT WITH PREVIOUSLY REPORTED/CALLED VALUE.MSS  Glucose, capillary     Status: Abnormal   Collection Time: 04/28/17  9:29 PM  Result Value Ref Range   Glucose-Capillary 163 (H) 65 - 99 mg/dL  Glucose, capillary     Status: Abnormal   Collection Time: 04/29/17  7:25 AM  Result Value Ref Range   Glucose-Capillary 198 (H) 65 - 99 mg/dL  Glucose, capillary     Status: Abnormal   Collection Time: 04/29/17 11:11 AM  Result Value Ref Range    Glucose-Capillary 146 (H) 65 - 99 mg/dL  Glucose, capillary     Status: Abnormal   Collection Time: 04/29/17  4:31 PM  Result Value Ref Range   Glucose-Capillary 164 (H) 65 - 99 mg/dL  Glucose, capillary     Status: Abnormal   Collection Time: 04/29/17  9:31 PM  Result Value Ref Range   Glucose-Capillary 159 (H) 65 - 99 mg/dL  Glucose, capillary     Status: Abnormal   Collection Time: 04/30/17  7:29 AM  Result Value Ref Range   Glucose-Capillary 226 (H) 65 - 99 mg/dL  Glucose, capillary     Status: Abnormal   Collection Time: 04/30/17 11:32 AM  Result Value Ref Range   Glucose-Capillary 199 (H) 65 - 99 mg/dL  Glucose, capillary     Status: Abnormal   Collection Time: 04/30/17  4:45 PM  Result Value Ref Range   Glucose-Capillary 264 (H) 65 - 99 mg/dL   Comment 1 Notify RN     Current Facility-Administered Medications  Medication Dose Route Frequency Provider Last Rate Last Dose  . 0.9 %  sodium chloride infusion   Intravenous Continuous Rogelia Mire, NP 20 mL/hr at 04/30/17 0000    . 0.9 %  sodium chloride infusion  250 mL Intravenous PRN End, Christopher, MD 10 mL/hr at 04/30/17 0000 250 mL at 04/30/17 0000  . acetaminophen (TYLENOL) tablet 650 mg  650 mg Oral Q6H PRN Idelle Crouch, MD   650 mg at 04/28/17 2055   Or  . acetaminophen (TYLENOL) suppository 650 mg  650 mg Rectal Q6H PRN Idelle Crouch, MD      . albuterol (PROVENTIL) (2.5 MG/3ML) 0.083% nebulizer solution 2.5 mg  2.5 mg Inhalation Q6H PRN Idelle Crouch, MD   2.5 mg at 04/27/17 3664  . ALPRAZolam Duanne Moron) tablet 0.5 mg  0.5 mg Oral TID Flora Lipps, MD   0.5 mg at 04/30/17 1711  . aspirin EC tablet 81 mg  81 mg Oral Daily Idelle Crouch, MD   81 mg at 04/30/17 1113  . bisacodyl (DULCOLAX) suppository 10 mg  10 mg Rectal Daily PRN Idelle Crouch, MD      . chlorhexidine (PERIDEX) 0.12 % solution 15 mL  15 mL Mouth Rinse BID Laverle Hobby, MD   15 mL at 04/30/17 1236  . cloNIDine  (CATAPRES) tablet 0.2 mg  0.2 mg Oral Q8H Idelle Crouch, MD   0.2 mg at 04/30/17 1456  . enalapril (VASOTEC) tablet 2.5 mg  2.5 mg Oral Daily Gollan, Kathlene November, MD  2.5 mg at 04/30/17 1233  . enoxaparin (LOVENOX) injection 40 mg  40 mg Subcutaneous Q24H Vaughan Basta, MD   40 mg at 04/30/17 1711  . [START ON 05/01/2017] furosemide (LASIX) tablet 20 mg  20 mg Oral Daily Arida, Muhammad A, MD      . gabapentin (NEURONTIN) capsule 800 mg  800 mg Oral TID Idelle Crouch, MD   800 mg at 04/30/17 1456  . insulin aspart (novoLOG) injection 0-9 Units  0-9 Units Subcutaneous TID WC Idelle Crouch, MD   5 Units at 04/30/17 1722  . insulin detemir (LEVEMIR) injection 10 Units  10 Units Subcutaneous Daily Vaughan Basta, MD   10 Units at 04/30/17 1233  . LORazepam (ATIVAN) injection 0.5 mg  0.5 mg Intravenous Q4H PRN Idelle Crouch, MD   0.5 mg at 04/29/17 0531  . metoCLOPramide (REGLAN) injection 5 mg  5 mg Intravenous Q6H Idelle Crouch, MD   5 mg at 04/30/17 1711  . metoprolol succinate (TOPROL-XL) 24 hr tablet 12.5 mg  12.5 mg Oral Daily End, Christopher, MD   12.5 mg at 04/30/17 1112  . mometasone-formoterol (DULERA) 200-5 MCG/ACT inhaler 2 puff  2 puff Inhalation BID Idelle Crouch, MD   2 puff at 04/30/17 1456  . morphine (MS CONTIN) 12 hr tablet 60 mg  60 mg Oral Q8H Vaughan Basta, MD   60 mg at 04/30/17 1455  . morphine 2 MG/ML injection 2 mg  2 mg Intravenous Q3H PRN Vaughan Basta, MD      . ondansetron (ZOFRAN) tablet 4 mg  4 mg Oral Q6H PRN Idelle Crouch, MD       Or  . ondansetron (ZOFRAN) injection 4 mg  4 mg Intravenous Q6H PRN Idelle Crouch, MD      . pantoprazole (PROTONIX) EC tablet 40 mg  40 mg Oral BID Vaughan Basta, MD   40 mg at 04/30/17 1113  . polyethylene glycol (MIRALAX / GLYCOLAX) packet 17 g  17 g Oral Daily Vaughan Basta, MD   17 g at 04/30/17 1112  . senna-docusate (Senokot-S) tablet 3 tablet  3 tablet  Oral BID Vaughan Basta, MD   3 tablet at 04/30/17 1113  . sodium chloride flush (NS) 0.9 % injection 3 mL  3 mL Intravenous Q12H End, Christopher, MD   3 mL at 04/29/17 2320  . sodium chloride flush (NS) 0.9 % injection 3 mL  3 mL Intravenous PRN End, Harrell Gave, MD      . tiotropium (SPIRIVA) inhalation capsule 18 mcg  18 mcg Inhalation Daily Idelle Crouch, MD   18 mcg at 04/30/17 1233  . venlafaxine XR (EFFEXOR-XR) 24 hr capsule 150 mg  150 mg Oral Daily Idelle Crouch, MD   150 mg at 04/30/17 1113    Musculoskeletal: Strength & Muscle Tone: within normal limits Gait & Station: normal Patient leans: N/A  Psychiatric Specialty Exam: Physical Exam  Nursing note and vitals reviewed. Constitutional: She appears well-developed and well-nourished.  HENT:  Head: Normocephalic and atraumatic.  Eyes: Pupils are equal, round, and reactive to light. Conjunctivae are normal.  Neck: Normal range of motion.  Cardiovascular: Regular rhythm and normal heart sounds.   Respiratory: Effort normal. No respiratory distress.  GI: Soft.  Musculoskeletal: Normal range of motion.  Neurological: She is alert.  Skin: Skin is warm and dry.  Psychiatric: Her speech is normal. Judgment normal. Her affect is labile. She is agitated. She is not aggressive. Thought content is  not paranoid. Cognition and memory are normal. She expresses no homicidal and no suicidal ideation.    Review of Systems  Constitutional: Negative.   HENT: Negative.   Eyes: Negative.   Respiratory: Negative.   Cardiovascular: Negative.   Gastrointestinal: Negative.   Musculoskeletal: Positive for back pain and neck pain.  Skin: Negative.   Neurological: Negative.   Psychiatric/Behavioral: Negative for depression, hallucinations, memory loss, substance abuse and suicidal ideas. The patient is not nervous/anxious and does not have insomnia.     Blood pressure 109/70, pulse 98, temperature 97.8 F (36.6 C), temperature  source Oral, resp. rate 20, height 4\' 10"  (1.473 m), weight 68.6 kg (151 lb 3.8 oz), SpO2 95 %.Body mass index is 31.61 kg/m.  General Appearance: Casual  Eye Contact:  Good  Speech:  Clear and Coherent  Volume:  Increased  Mood:  Irritable  Affect:  Full Range and Patient is quite dramatic. Makes a lot of faces and voices. Clearly a very expressive woman.  Thought Process:  Coherent  Orientation:  Full (Time, Place, and Person)  Thought Content:  Logical  Suicidal Thoughts:  No  Homicidal Thoughts:  No  Memory:  Immediate;   Fair Recent;   Fair Remote;   Fair  Judgement:  Fair  Insight:  Fair  Psychomotor Activity:  Decreased  Concentration:  Concentration: Fair  Recall:  [employer] of Knowledge:  Fair  Language:  Fair  Akathisia:  No  Handed:  Right  AIMS (if indicated):     Assets:  Desire for Improvement Housing Resilience Social Support  ADL's:  Impaired  Cognition:  WNL  Sleep:        Treatment Plan Summary: Plan 66 year old woman who reports that all of her symptoms of anxiety and depression were the result of her pain and frustration. She says she is finally through with most of the withdrawal and her pain is feeling better controlled. Denies a syndrome of depression. Denies ongoing anxiety. No evidence of psychosis or other mental health problems. Patient has been on Effexor chronically no reason to discontinue it. I suspect from my interview that this patient is probably a rather dramatic person all of the time in how she presents herself. She and her relative who were in the room both acted as if all of this were completely normal. I don't think there is any indication for any further psychiatric intervention or treatment.  Disposition: Patient does not meet criteria for psychiatric inpatient admission. Supportive therapy provided about ongoing stressors.  Alethia Berthold, MD 04/30/2017 6:45 PM

## 2017-04-30 NOTE — Progress Notes (Signed)
Progress Note  Patient Name: Jordan Chan Date of Encounter: 04/30/2017  Primary Cardiologist: Jerilynn Mages. Fletcher Anon, MD   Subjective   She reports that her pain is reasonably controlled today and she seems to be calm. No chest pain.  Inpatient Medications    Scheduled Meds: . ALPRAZolam  0.5 mg Oral TID  . aspirin EC  81 mg Oral Daily  . atorvastatin  80 mg Oral q1800  . chlorhexidine  15 mL Mouth Rinse BID  . cloNIDine  0.2 mg Oral Q8H  . enalapril  2.5 mg Oral Daily  . enoxaparin (LOVENOX) injection  40 mg Subcutaneous Q24H  . furosemide  20 mg Intravenous Daily  . gabapentin  800 mg Oral TID  . insulin aspart  0-9 Units Subcutaneous TID WC  . insulin detemir  10 Units Subcutaneous Daily  . metoCLOPramide (REGLAN) injection  5 mg Intravenous Q6H  . metoprolol succinate  12.5 mg Oral Daily  . mometasone-formoterol  2 puff Inhalation BID  . morphine  60 mg Oral Q8H  . pantoprazole  40 mg Oral BID  . polyethylene glycol  17 g Oral Daily  . senna-docusate  3 tablet Oral BID  . sodium chloride flush  3 mL Intravenous Q12H  . tiotropium  18 mcg Inhalation Daily  . venlafaxine XR  150 mg Oral Daily   Continuous Infusions: . sodium chloride 20 mL/hr at 04/30/17 0000  . sodium chloride 250 mL (04/30/17 0000)   PRN Meds: sodium chloride, acetaminophen **OR** acetaminophen, albuterol, bisacodyl, LORazepam, morphine injection, ondansetron **OR** ondansetron (ZOFRAN) IV, sodium chloride flush   Vital Signs    Vitals:   04/30/17 0619 04/30/17 0822 04/30/17 0931 04/30/17 1425  BP: (!) 95/58  (!) 110/43 109/70  Pulse:   90 98  Resp:  16 20   Temp: 97.8 F (36.6 C)  98 F (36.7 C) 97.8 F (36.6 C)  TempSrc: Axillary  Oral Oral  SpO2:  98% 93% 95%  Weight:      Height:        Intake/Output Summary (Last 24 hours) at 04/30/17 1548 Last data filed at 04/30/17 1357  Gross per 24 hour  Intake             1453 ml  Output             2855 ml  Net            -1402 ml   Filed  Weights   04/26/17 0500 04/28/17 0948 04/30/17 0500  Weight: 158 lb 11.7 oz (72 kg) 158 lb (71.7 kg) 151 lb 3.8 oz (68.6 kg)    Physical Exam   GEN: Well nourished, well developed, in no acute distress.  HEENT: Grossly normal.  Neck: Supple, no JVD, carotid bruits, or masses. Cardiac: RRR, no murmurs, rubs, or gallops. No clubbing, cyanosis, edema.  Radials/DP/PT 2+ and equal bilaterally. R wrist cath site w/o bleeding/bruit/hematoma. Respiratory:  Respirations regular and unlabored, clear to auscultation bilaterally. GI: Soft, nontender, nondistended, BS + x 4. MS: no deformity or atrophy. Skin: warm and dry, no rash. Neuro:  Strength and sensation are intact. Psych: AAOx3.  Normal affect.  Labs    Chemistry  Recent Labs Lab 04/25/17 0820 04/26/17 0505 04/27/17 0405 04/28/17 0439  NA 138 139 140 140  K 3.8 3.9 4.0 3.7  CL 101 107 111 108  CO2 27 24 21* 25  GLUCOSE 210* 215* 269* 154*  BUN 11 11 13 15   CREATININE 0.63  0.70 0.54 0.58  CALCIUM 9.7 8.4* 7.6* 8.1*  PROT 8.8* 7.3  --   --   ALBUMIN 4.2 3.3*  --   --   AST 25 25  --   --   ALT 16 13*  --   --   ALKPHOS 61 49  --   --   BILITOT 0.6 0.6  --   --   GFRNONAA >60 >60 >60 >60  GFRAA >60 >60 >60 >60  ANIONGAP 10 8 8 7      Hematology  Recent Labs Lab 04/26/17 0505 04/27/17 0405 04/28/17 0439  WBC 20.8* 15.6* 17.8*  RBC 4.44 3.84 4.02  HGB 13.0 11.5* 11.8*  HCT 39.2 34.0* 35.6  MCV 88.2 88.6 88.5  MCH 29.4 30.0 29.3  MCHC 33.3 33.9 33.1  RDW 15.8* 15.8* 16.3*  PLT 422 333 383    Cardiac Enzymes  Recent Labs Lab 04/28/17 0037 04/28/17 0652 04/28/17 1335 04/28/17 1849  TROPONINI 0.27* 0.25* 0.20* 0.21*     Radiology    No results found.  Telemetry    RSR - sinus tach - Personally Reviewed   Cardiac Studies   Study Conclusions  - Left ventricle: The cavity size was at the upper limits of   normal. Wall thickness was normal. Systolic function was severely   reduced. The  estimated ejection fraction was in the range of 25%   to 30%. There is severe hypokinesis of the mid-apical myocardium.   The study is not technically sufficient to allow evaluation of LV   diastolic function. - Left atrium: The atrium was mildly dilated. - Right ventricle: Poorly visualized. The cavity size was normal. _____________  Conclusions: 1. No angiographically significant coronary artery disease. 2. Severely reduced LV contraction (LVEF 25-30%) with mid and apical akinesis, consistent with Takotsubo (stress-induced) cardiomyopathy. 3. Mildly elevated left ventricular filling pressure.  Recommendations: 1. Medical therapy, including initiation of metoprolol succinate 12.5 mg daily and furosemide 20 mg IV daily. If blood pressure tolerates, consider addition of low-dose ACE inhibitor tomorrow. 2. Continue treatment of pain and opioid withdrawal.  Patient Profile     66 y.o. female wth a h/o chronic lbp, DM, rem tob abuse, COPD, OSA, and breast cancer who presented due to intrathecal pump failure, pain, mso4 w/d, and was found to have an abnl ecg and trop. Cath  nl cors, EF 25-30%  takotsubo CM.  Assessment & Plan    1.  Takotsubo CM/acute systolic CHF: Cath 0/03 revealed nl cors w/ LV dysfxn and apical ballooning consistent w/ takotsubo CM.  Filling pressures mildly elevated and lasix added yesterday.   I'm going to switch to furosemide to 20 mg by mouth daily as she appears to be euvolemic. Blood pressure is low at this time and not able to up titrate metoprolol. This is likely due to the fact that she is on clonidine for withdrawal symptoms. Given that her pain is more reasonably controlled, consider decreasing clonidine and increasing Toprol.  2.  Chronic Pain:   Pain seems to be more controlled with oral morphine.   Signed, Kathlyn Sacramento, MD  04/30/2017, 3:48 PM

## 2017-05-01 ENCOUNTER — Inpatient Hospital Stay: Payer: Medicare HMO

## 2017-05-01 LAB — URINALYSIS, COMPLETE (UACMP) WITH MICROSCOPIC
Bilirubin Urine: NEGATIVE
Glucose, UA: NEGATIVE mg/dL
Hgb urine dipstick: NEGATIVE
Ketones, ur: NEGATIVE mg/dL
Nitrite: POSITIVE — AB
Protein, ur: NEGATIVE mg/dL
Specific Gravity, Urine: 1.008 (ref 1.005–1.030)
pH: 6 (ref 5.0–8.0)

## 2017-05-01 LAB — GLUCOSE, CAPILLARY
Glucose-Capillary: 187 mg/dL — ABNORMAL HIGH (ref 65–99)
Glucose-Capillary: 277 mg/dL — ABNORMAL HIGH (ref 65–99)

## 2017-05-01 MED ORDER — CLONIDINE HCL 0.1 MG PO TABS: 0.1000 mg | ORAL_TABLET | Freq: Three times a day (TID) | ORAL | 0 refills | Status: DC

## 2017-05-01 MED ORDER — ALPRAZOLAM 0.5 MG PO TABS: 0.5000 mg | ORAL_TABLET | Freq: Three times a day (TID) | ORAL | 0 refills | Status: DC

## 2017-05-01 MED ORDER — PANTOPRAZOLE SODIUM 40 MG PO TBEC: 40.0000 mg | DELAYED_RELEASE_TABLET | Freq: Two times a day (BID) | ORAL | 0 refills | Status: DC

## 2017-05-01 MED ORDER — INSULIN DETEMIR 100 UNIT/ML FLEXPEN: 10.0000 [IU] | PEN_INJECTOR | Freq: Every day | SUBCUTANEOUS | 12 refills | Status: DC

## 2017-05-01 MED ORDER — NYSTATIN 100000 UNIT/ML MT SUSP: 5.0000 mL | Freq: Four times a day (QID) | OROMUCOSAL | 0 refills | Status: DC

## 2017-05-01 MED ORDER — METOPROLOL SUCCINATE ER 25 MG PO TB24: 12.5000 mg | ORAL_TABLET | Freq: Every day | ORAL | 0 refills | Status: DC

## 2017-05-01 MED ORDER — ONDANSETRON 4 MG PO TBDP: 4.0000 mg | ORAL_TABLET | Freq: Three times a day (TID) | ORAL | 0 refills | Status: DC | PRN

## 2017-05-01 MED ORDER — CLONIDINE HCL 0.1 MG PO TABS: 0.1000 mg | ORAL_TABLET | Freq: Three times a day (TID) | ORAL | Status: DC

## 2017-05-01 MED ORDER — MORPHINE SULFATE ER 60 MG PO TBCR: 60.0000 mg | EXTENDED_RELEASE_TABLET | Freq: Three times a day (TID) | ORAL | 0 refills | Status: DC

## 2017-05-01 MED ORDER — SENNOSIDES-DOCUSATE SODIUM 8.6-50 MG PO TABS: 1.0000 | ORAL_TABLET | Freq: Two times a day (BID) | ORAL | 0 refills | Status: DC

## 2017-05-01 MED ORDER — FUROSEMIDE 20 MG PO TABS: 20.0000 mg | ORAL_TABLET | Freq: Every day | ORAL | 0 refills | Status: DC

## 2017-05-01 MED ORDER — HYDROCODONE-ACETAMINOPHEN 5-325 MG PO TABS
1.0000 | ORAL_TABLET | Freq: Four times a day (QID) | ORAL | 0 refills | Status: DC | PRN
Start: 2017-05-01 — End: 2017-07-22

## 2017-05-01 NOTE — Care Management Important Message (Signed)
Important Message  Patient Details  Name: Jordan Chan MRN: [id number] Date of Birth: November 03, 1950   Medicare Important Message Given:  Yes    Beverly Sessions, RN 05/01/2017, 11:55 AM

## 2017-05-01 NOTE — Progress Notes (Signed)
Progress Note  Patient Name: Jordan Chan Date of Encounter: 05/01/2017  Primary Cardiologist: Dr. Fletcher Anon  Subjective   She denies any chest pain or palpitations. Breathing at baseline. Reports frequent urination with Lasix.   Inpatient Medications    Scheduled Meds: . ALPRAZolam  0.5 mg Oral TID  . aspirin EC  81 mg Oral Daily  . chlorhexidine  15 mL Mouth Rinse BID  . cloNIDine  0.1 mg Oral Q8H  . enalapril  2.5 mg Oral Daily  . enoxaparin (LOVENOX) injection  40 mg Subcutaneous Q24H  . furosemide  20 mg Oral Daily  . gabapentin  800 mg Oral TID  . insulin aspart  0-9 Units Subcutaneous TID WC  . insulin detemir  10 Units Subcutaneous Daily  . metoCLOPramide (REGLAN) injection  5 mg Intravenous Q6H  . metoprolol succinate  12.5 mg Oral Daily  . mometasone-formoterol  2 puff Inhalation BID  . morphine  60 mg Oral Q8H  . pantoprazole  40 mg Oral BID  . polyethylene glycol  17 g Oral Daily  . senna-docusate  3 tablet Oral BID  . sodium chloride flush  3 mL Intravenous Q12H  . tiotropium  18 mcg Inhalation Daily  . venlafaxine XR  150 mg Oral Daily   Continuous Infusions: . sodium chloride 20 mL/hr at 04/30/17 0000  . sodium chloride 250 mL (04/30/17 0000)   PRN Meds: sodium chloride, acetaminophen **OR** acetaminophen, albuterol, bisacodyl, LORazepam, morphine injection, ondansetron **OR** ondansetron (ZOFRAN) IV, sodium chloride flush   Vital Signs    Vitals:   04/30/17 0931 04/30/17 1425 04/30/17 2119 05/01/17 0527  BP: (!) 110/43 109/70 108/61 (!) 106/56  Pulse: 90 98 87 76  Resp: 20  20 16   Temp: 98 F (36.7 C) 97.8 F (36.6 C) 98.6 F (37 C) 98.4 F (36.9 C)  TempSrc: Oral Oral Oral Oral  SpO2: 93% 95% 96% 91%  Weight:      Height:        Intake/Output Summary (Last 24 hours) at 05/01/17 1205 Last data filed at 05/01/17 1010  Gross per 24 hour  Intake              903 ml  Output             1750 ml  Net             -847 ml   Filed Weights    04/26/17 0500 04/28/17 0948 04/30/17 0500  Weight: 158 lb 11.7 oz (72 kg) 158 lb (71.7 kg) 151 lb 3.8 oz (68.6 kg)    Telemetry    Not connected to telemetry.   ECG    No new tracings.   Physical Exam   General: Well developed, well nourished Caucasian female appearing in no acute distress. Head: Normocephalic, atraumatic.  Neck: Supple without bruits, JVD not elevated. Lungs:  Resp regular and unlabored, CTA without wheezing or rales. Heart: RRR, S1, S2, no S3, S4, or murmur; no rub. Abdomen: Soft, non-tender, non-distended with normoactive bowel sounds. No hepatomegaly. No rebound/guarding. No obvious abdominal masses. Extremities: No clubbing, cyanosis, or lower extremity edema. Distal pedal pulses are 2+ bilaterally. Neuro: Alert and oriented X 3. Moves all extremities spontaneously. Psych: Normal affect.  Labs    Chemistry Recent Labs Lab 04/25/17 0820 04/26/17 0505 04/27/17 0405 04/28/17 0439  NA 138 139 140 140  K 3.8 3.9 4.0 3.7  CL 101 107 111 108  CO2 27 24 21* 25  GLUCOSE  210* 215* 269* 154*  BUN 11 11 13 15   CREATININE 0.63 0.70 0.54 0.58  CALCIUM 9.7 8.4* 7.6* 8.1*  PROT 8.8* 7.3  --   --   ALBUMIN 4.2 3.3*  --   --   AST 25 25  --   --   ALT 16 13*  --   --   ALKPHOS 61 49  --   --   BILITOT 0.6 0.6  --   --   GFRNONAA >60 >60 >60 >60  GFRAA >60 >60 >60 >60  ANIONGAP 10 8 8 7      Hematology Recent Labs Lab 04/26/17 0505 04/27/17 0405 04/28/17 0439  WBC 20.8* 15.6* 17.8*  RBC 4.44 3.84 4.02  HGB 13.0 11.5* 11.8*  HCT 39.2 34.0* 35.6  MCV 88.2 88.6 88.5  MCH 29.4 30.0 29.3  MCHC 33.3 33.9 33.1  RDW 15.8* 15.8* 16.3*  PLT 422 333 383    Cardiac Enzymes Recent Labs Lab 04/28/17 0037 04/28/17 0652 04/28/17 1335 04/28/17 1849  TROPONINI 0.27* 0.25* 0.20* 0.21*   No results for input(s): TROPIPOC in the last 168 hours.   BNPNo results for input(s): BNP, PROBNP in the last 168 hours.   DDimer No results for input(s): DDIMER in  the last 168 hours.   Radiology    No results found.  Cardiac Studies   Echocardiogram: 04/28/2017 Study Conclusions  - Left ventricle: The cavity size was at the upper limits of   normal. Wall thickness was normal. Systolic function was severely   reduced. The estimated ejection fraction was in the range of 25%   to 30%. There is severe hypokinesis of the mid-apical myocardium.   The study is not technically sufficient to allow evaluation of LV   diastolic function. - Left atrium: The atrium was mildly dilated. - Right ventricle: Poorly visualized. The cavity size was normal.   Cardiac Catheterization: 04/28/2017 Conclusions: 1. No angiographically significant coronary artery disease. 2. Severely reduced LV contraction (LVEF 25-30%) with mid and apical akinesis, consistent with Takotsubo (stress-induced) cardiomyopathy. 3. Mildly elevated left ventricular filling pressure.  Recommendations: 1. Medical therapy, including initiation of metoprolol succinate 12.5 mg daily and furosemide 20 mg IV daily. If blood pressure tolerates, consider addition of low-dose ACE inhibitor tomorrow. 2. Continue treatment of pain and opioid withdrawal.   Patient Profile     66 y.o. female w/ PMH of chronic back pain, Type 2 DM, breast cancer, COPD, and GERD who presented to [facility] on 04/25/2017 for severe pain, admitted with opioid withdrawal. Cards consulted for evaluation of NSTEMI.   Assessment & Plan    1. Takotsubo Cardiomyopathy/ Acute Systolic CHF - echo this admission shows a reduced EF of 25-30% with severe hypokinesis of the mid-apical myocardium. Cardiac catheterization was performed on 04/28/2017 and showed normal cors with apical ballooning consistent with takotsubo cardiomyopathy. - Filling pressures were mildly elevated and Lasix was added.Has been switched from IV Lasix to PO Lasix 20mg  daily. - she is currently on Toprol-XL 12.5mg  daily, Enalapril 2.5mg  daily and Clonidine 0.1mg   TID (for pain). Continue to decrease Clonidine and titrate BB therapy as BP allows. BP has been soft at 95/58 - 109/70 within the past 24 hours, therefore would not further titrate BB therapy at this time.  - sodium and fluid restriction reviewed with the patient. Consider repeat echocardiogram in 2-3 months to reassess EF following treatment with medical therapy. If cardiac clearance forms need to be completed prior to her outpatient pump battery replaced,  please contact our office.   2.  Chronic Pain -  S/p intrathecal pump failure. Pain control improved with adjustment of PO medications.   Signed, Erma Heritage , PA-C 12:05 PM 05/01/2017 Pager: [phone]

## 2017-05-01 NOTE — Progress Notes (Signed)
@ [name] to be D/C'd Home per MD order.  Discussed prescriptions and follow up appointments with the patient. Prescriptions given to patient, medication list explained in detail. Pt verbalized understanding.  Allergies as of 05/01/2017      Reactions   Other Palpitations   IV steroids   Pain Patch [menthol] Anaphylaxis   Avelox [moxifloxacin Hcl In Nacl] Other (See Comments)   Upset stomach   Erythromycin Nausea Only, Other (See Comments)   Can take a Z-Pak just fine   Fentanyl    Oxycontin [oxycodone] Hives      Medication List    STOP taking these medications   clonazePAM 0.5 MG tablet Commonly known as:  KLONOPIN   diclofenac 50 MG EC tablet Commonly known as:  VOLTAREN   Dulaglutide 0.75 MG/0.5ML Sopn Commonly known as:  TRULICITY   fluconazole 150 MG tablet Commonly known as:  DIFLUCAN   metFORMIN 500 MG 24 hr tablet Commonly known as:  GLUCOPHAGE-XR   verapamil 120 MG CR tablet Commonly known as:  CALAN-SR     TAKE these medications   ACCU-CHEK AVIVA PLUS test strip Generic drug:  glucose blood CHECK ONCE A DAY   ACCU-CHEK SOFTCLIX LANCETS lancets CHECK ONCE A DAY   ADVAIR DISKUS 250-50 MCG/DOSE Aepb Generic drug:  Fluticasone-Salmeterol INHALE 1 PUFF TWICE DAILY   albuterol 108 (90 Base) MCG/ACT inhaler Commonly known as:  PROVENTIL HFA;VENTOLIN HFA Inhale 2 puffs into the lungs every 6 (six) hours as needed for wheezing or shortness of breath.   ALPRAZolam 0.5 MG tablet Commonly known as:  XANAX Take 1 tablet (0.5 mg total) by mouth 3 (three) times daily.   aspirin EC 81 MG tablet Take 81 mg by mouth daily.   atorvastatin 20 MG tablet Commonly known as:  LIPITOR TAKE 1 TABLET EVERY DAY   cloNIDine 0.1 MG tablet Commonly known as:  CATAPRES Take 1 tablet (0.1 mg total) by mouth every 8 (eight) hours.   cyclobenzaprine 5 MG tablet Commonly known as:  FLEXERIL Take 5 mg by mouth 2 (two) times daily as needed.   furosemide 20 MG  tablet Commonly known as:  LASIX Take 1 tablet (20 mg total) by mouth daily.   gabapentin 800 MG tablet Commonly known as:  NEURONTIN Take 1 tablet (800 mg total) by mouth 3 (three) times daily.   HYDROcodone-acetaminophen 5-325 MG tablet Commonly known as:  NORCO Take 1 tablet by mouth every 6 (six) hours as needed for moderate pain or severe pain (breakthrough pain). Patient confirms, she did not have reactions to hydrocodone in past.   Insulin Detemir 100 UNIT/ML Pen Commonly known as:  LEVEMIR FLEXTOUCH Inject 10 Units into the skin daily at 10 pm. What changed:  See the new instructions.   lisinopril 5 MG tablet Commonly known as:  PRINIVIL,ZESTRIL Take 1 tablet (5 mg total) by mouth daily.   metoprolol succinate 25 MG 24 hr tablet Commonly known as:  TOPROL-XL Take 0.5 tablets (12.5 mg total) by mouth daily.   morphine 60 MG 12 hr tablet Commonly known as:  MS CONTIN Take 1 tablet (60 mg total) by mouth every 8 (eight) hours.   nystatin 100000 UNIT/ML suspension Commonly known as:  MYCOSTATIN Take 5 mLs (500,000 Units total) by mouth 4 (four) times daily.   ondansetron 4 MG disintegrating tablet Commonly known as:  ZOFRAN ODT Take 1 tablet (4 mg total) by mouth every 8 (eight) hours as needed for nausea or vomiting.  pantoprazole 40 MG tablet Commonly known as:  PROTONIX Take 1 tablet (40 mg total) by mouth 2 (two) times daily.   promethazine 25 MG tablet Commonly known as:  PHENERGAN Take 1 tablet (25 mg total) by mouth every 8 (eight) hours as needed for nausea or vomiting.   senna-docusate 8.6-50 MG tablet Commonly known as:  Senokot-S Take 1 tablet by mouth 2 (two) times daily.   SPIRIVA HANDIHALER 18 MCG inhalation capsule Generic drug:  tiotropium Place 18 mcg into inhaler and inhale daily.   venlafaxine XR 150 MG 24 hr capsule Commonly known as:  EFFEXOR-XR TAKE 1 CAPSULE EVERY DAY            Discharge Care Instructions        Start      Ordered   05/02/17 0000  furosemide (LASIX) 20 MG tablet  Daily     05/01/17 1105   05/01/17 0000  ALPRAZolam (XANAX) 0.5 MG tablet  3 times daily     05/01/17 1105   05/01/17 0000  cloNIDine (CATAPRES) 0.1 MG tablet  Every 8 hours     05/01/17 1105   05/01/17 0000  Insulin Detemir (LEVEMIR FLEXTOUCH) 100 UNIT/ML Pen  Daily at 10 pm     05/01/17 1105   05/01/17 0000  metoprolol succinate (TOPROL-XL) 25 MG 24 hr tablet  Daily     05/01/17 1105   05/01/17 0000  morphine (MS CONTIN) 60 MG 12 hr tablet  Every 8 hours     05/01/17 1105   05/01/17 0000  ondansetron (ZOFRAN ODT) 4 MG disintegrating tablet  Every 8 hours PRN     05/01/17 1105   05/01/17 0000  pantoprazole (PROTONIX) 40 MG tablet  2 times daily     05/01/17 1105   05/01/17 0000  HYDROcodone-acetaminophen (NORCO) 5-325 MG tablet  Every 6 hours PRN     05/01/17 1105   05/01/17 0000  senna-docusate (SENOKOT-S) 8.6-50 MG tablet  2 times daily     05/01/17 1105   05/01/17 0000  Increase activity slowly     05/01/17 1105   05/01/17 0000  Diet - low sodium heart healthy     05/01/17 1105   05/01/17 0000  nystatin (MYCOSTATIN) 100000 UNIT/ML suspension  4 times daily     05/01/17 1334      Vitals:   04/30/17 2119 05/01/17 0527  BP: 108/61 (!) 106/56  Pulse: 87 76  Resp: 20 16  Temp: 98.6 F (37 C) 98.4 F (36.9 C)  SpO2: 96% 91%    Skin clean, dry and intact without evidence of skin break down, no evidence of skin tears noted. IV catheter discontinued intact. Site without signs and symptoms of complications. Dressing and pressure applied. Pt denies pain at this time. No complaints noted.  An After Visit Summary was printed and given to the patient. Patient escorted via [place], and D/C home via private auto.  Sharalyn Ink

## 2017-05-01 NOTE — Progress Notes (Signed)
Inpatient Diabetes Program Recommendations  AACE/ADA: New Consensus Statement on Inpatient Glycemic Control (2015)  Target Ranges:  Prepandial:   less than 140 mg/dL      Peak postprandial:   less than 180 mg/dL (1-2 hours)      Critically ill patients:  140 - 180 mg/dL   Results for Jordan Chan, Jordan Chan (MRN [id number]) as of 05/01/2017 10:47  Ref. Range 04/30/2017 07:29 04/30/2017 11:32 04/30/2017 16:45 04/30/2017 21:20  Glucose-Capillary Latest Ref Range: 65 - 99 mg/dL 226 (H) 199 (H) 264 (H) 130 (H)   Results for Jordan Chan, Jordan Chan (MRN [id number]) as of 05/01/2017 10:47  Ref. Range 05/01/2017 07:43  Glucose-Capillary Latest Ref Range: 65 - 99 mg/dL 187 (H)    Admit with: Opioid Withdrawal from Pain Pump Failure  History: DM  Home DM Meds: Levemir 32 units QHS                             Metformin 1000 mg BID                             Trulicity 4.88 mg Qweek  Current Insulin Orders: Novolog Sensitive Correction Scale/ SSI (0-9 units) TID AC      Levemir 10 units daily       MD- Please consider the following in-hospital insulin adjustments:  1. Increase Levemir to 15 units daily (50% total home dose)  2. Start low dose Novolog Meal Coverage: Novolog 3 units TID with meals (hold if pt eats <50% of meal)      --Will follow patient during hospitalization--  Wyn Quaker RN, MSN, CDE Diabetes Coordinator Inpatient Glycemic Control Team Team Pager: [phone] (8a-5p)

## 2017-05-04 NOTE — Discharge Summary (Signed)
[facility] NAME: Jordan Chan    MR#:  [id number]  DATE OF BIRTH:  28-Feb-1951  DATE OF ADMISSION:  04/25/2017 ADMITTING PHYSICIAN: Idelle Crouch, MD  DATE OF DISCHARGE: 05/01/2017  2:24 PM  PRIMARY CARE PHYSICIAN: Kathrine Haddock, NP    ADMISSION DIAGNOSIS:  Narcotic withdrawal ([place]) [F11.23] Intractable vomiting with nausea, unspecified vomiting type [R11.2]  DISCHARGE DIAGNOSIS:  Principal Problem:   Adjustment disorder with mixed anxiety and depressed mood Active Problems:   Chronic back pain   Opioid withdrawal (H[facility])   Intractable nausea and vomiting   Acute encephalopathy   Opiate withdrawal (HCC)   Non-ST elevation (NSTEMI) myocardial infarction [facility])   SECONDARY DIAGNOSIS:   Past Medical History:  Diagnosis Date  . Arthritis   . Asthma   . Breast cancer ([place]) 1998   right breast ca with mastectomy and chemotherapy and radiation  . COPD (chronic obstructive pulmonary disease) ([place])   . Diabetes mellitus without complication ([place])   . Endometriosis   . GERD (gastroesophageal reflux disease)   . IBS (irritable bowel syndrome)   . Low back pain    a. Implanted morphine/bupivicaine/clonidine pump.  . Sleep apnea     HOSPITAL COURSE:   * severe opioid withdrawal   IV precedex drip.   Manage per intensivist   On Mprphin PCA drip.    Requiring banzos to help anxiety.   As mentioned, I spoke to her pain management physician, he suggest- Morphin 60 mg long acting TID.   ( depending on her use from last 24 hrs) and oral meds for breakthrough pains.    switched and stable on oral meds, started on MS contin 60 mg TID.  * opioid pump malfunction   For last 10 years on morphin pump.   Pain management clinic consult.   Started on morphine to help.   I also spoke to his pain clinic and left a msg with receptionist and told her to call me back and let me   speak to fellow or the doctor. They were  working on getting  Her pump battey replaced.   I did receive on hospital phone in after hours, which I missed. Called today again and provided my cellphone number to call me back to discuss the case.  04/29/17- I was able to talk to Dr. Marcha Solders, plan as mentioned earlier. He is out of town, so can do change her pump next week. We may either send her home or transfer her next week to his care.  on MS CONTIN and stopped morphine IV drip.   D/c home, and Her pain clinic will call her next week for appointment.  * Dyspnea, elevated troponin, EKG changes. Stress induced cardiomyopathy   NSTEMI suspected- now ruled out, but found to have stress induced cardiomyopathy on Cath.    Appreciated urgent help of cardio    on ASA.   Low dose betablocker and lasix.    Once blood pressure allows- ACE inhibitors.   * Severe anxiety   Xanax now.    Psych consult to help.  * COPD    No exacerbation    Cont Inhalers and nebs.  * Hypertension   Hold meds due to hypotension  * DM    As due to sedation, No oral intake.   Keep on ISS, no long acting insuline now.  DISCHARGE CONDITIONS:   Stable.  CONSULTS OBTAINED:  Treatment Team:  Wellington Hampshire,  MD Clapacs, Madie Reno, MD  DRUG ALLERGIES:   Allergies  Allergen Reactions  . Other Palpitations    IV steroids  . Pain Patch [Menthol] Anaphylaxis  . Avelox [Moxifloxacin Hcl In Nacl] Other (See Comments)    Upset stomach  . Erythromycin Nausea Only and Other (See Comments)    Can take a Z-Pak just fine  . Fentanyl   . Oxycontin [Oxycodone] Hives    DISCHARGE MEDICATIONS:   Discharge Medication List as of 05/01/2017 11:24 AM    START taking these medications   Details  ALPRAZolam (XANAX) 0.5 MG tablet Take 1 tablet (0.5 mg total) by mouth 3 (three) times daily., Starting Fri 05/01/2017, Print    cloNIDine (CATAPRES) 0.1 MG tablet Take 1 tablet (0.1 mg total) by mouth every 8 (eight) hours., Starting Fri 05/01/2017, Print     furosemide (LASIX) 20 MG tablet Take 1 tablet (20 mg total) by mouth daily., Starting Sat 05/02/2017, Print    HYDROcodone-acetaminophen (NORCO) 5-325 MG tablet Take 1 tablet by mouth every 6 (six) hours as needed for moderate pain or severe pain (breakthrough pain). Patient confirms, she did not have reactions to hydrocodone in past., Starting Fri 05/01/2017, Print    metoprolol succinate (TOPROL-XL) 25 MG 24 hr tablet Take 0.5 tablets (12.5 mg total) by mouth daily., Starting Fri 05/01/2017, Print    morphine (MS CONTIN) 60 MG 12 hr tablet Take 1 tablet (60 mg total) by mouth every 8 (eight) hours., Starting Fri 05/01/2017, Print    ondansetron (ZOFRAN ODT) 4 MG disintegrating tablet Take 1 tablet (4 mg total) by mouth every 8 (eight) hours as needed for nausea or vomiting., Starting Fri 05/01/2017, Print    pantoprazole (PROTONIX) 40 MG tablet Take 1 tablet (40 mg total) by mouth 2 (two) times daily., Starting Fri 05/01/2017, Print    senna-docusate (SENOKOT-S) 8.6-50 MG tablet Take 1 tablet by mouth 2 (two) times daily., Starting Fri 05/01/2017, Print      CONTINUE these medications which have CHANGED   Details  Insulin Detemir (LEVEMIR FLEXTOUCH) 100 UNIT/ML Pen Inject 10 Units into the skin daily at 10 pm., Starting Fri 05/01/2017, Normal      CONTINUE these medications which have NOT CHANGED   Details  ACCU-CHEK AVIVA PLUS test strip CHECK ONCE A DAY, Normal    ACCU-CHEK SOFTCLIX LANCETS lancets CHECK ONCE A DAY, Normal    ADVAIR DISKUS 250-50 MCG/DOSE AEPB INHALE 1 PUFF TWICE DAILY, Normal    albuterol (PROVENTIL HFA;VENTOLIN HFA) 108 (90 BASE) MCG/ACT inhaler Inhale 2 puffs into the lungs every 6 (six) hours as needed for wheezing or shortness of breath., Historical Med    aspirin EC 81 MG tablet Take 81 mg by mouth daily., Historical Med    atorvastatin (LIPITOR) 20 MG tablet TAKE 1 TABLET EVERY DAY, Normal    cyclobenzaprine (FLEXERIL) 5 MG tablet Take 5 mg by mouth 2 (two)  times daily as needed., Starting Thu 02/26/2017, Historical Med    gabapentin (NEURONTIN) 800 MG tablet Take 1 tablet (800 mg total) by mouth 3 (three) times daily., Starting Mon 01/12/2017, Normal    lisinopril (PRINIVIL,ZESTRIL) 5 MG tablet Take 1 tablet (5 mg total) by mouth daily., Starting Mon 07/14/2016, Normal    promethazine (PHENERGAN) 25 MG tablet Take 1 tablet (25 mg total) by mouth every 8 (eight) hours as needed for nausea or vomiting., Starting Tue 03/24/2017, Normal    tiotropium (SPIRIVA HANDIHALER) 18 MCG inhalation capsule Place 18 mcg into inhaler and  inhale daily., Historical Med    venlafaxine XR (EFFEXOR-XR) 150 MG 24 hr capsule TAKE 1 CAPSULE EVERY DAY, Normal      STOP taking these medications     clonazePAM (KLONOPIN) 0.5 MG tablet      diclofenac (VOLTAREN) 50 MG EC tablet      Dulaglutide (TRULICITY) 7.86 VE/7.2CN SOPN      fluconazole (DIFLUCAN) 150 MG tablet      metFORMIN (GLUCOPHAGE-XR) 500 MG 24 hr tablet      verapamil (CALAN-SR) 120 MG CR tablet          DISCHARGE INSTRUCTIONS:    Follow with pain clinic in 3-4 days.  If you experience worsening of your admission symptoms, develop shortness of breath, life threatening emergency, suicidal or homicidal thoughts you must seek medical attention immediately by calling 911 or calling your MD immediately  if symptoms less severe.  You Must read complete instructions/literature along with all the possible adverse reactions/side effects for all the Medicines you take and that have been prescribed to you. Take any new Medicines after you have completely understood and accept all the possible adverse reactions/side effects.   Please note  You were cared for by a hospitalist during your hospital stay. If you have any questions about your discharge medications or the care you received while you were in the hospital after you are discharged, you can call the unit and asked to speak with the hospitalist on  call if the hospitalist that took care of you is not available. Once you are discharged, your primary care physician will handle any further medical issues. Please note that NO REFILLS for any discharge medications will be authorized once you are discharged, as it is imperative that you return to your primary care physician (or establish a relationship with a primary care physician if you do not have one) for your aftercare needs so that they can reassess your need for medications and monitor your lab values.    Today   CHIEF COMPLAINT:   Chief Complaint  Patient presents with  . Pain Pump Malfunction    breast cancer patient    HISTORY OF PRESENT ILLNESS:  Dae Antonucci  is a 66 y.o. female with a known history of DM, COPD, and chronic back pain with intrathecal pump followed by [facility] now with N/V, confusion, and worsening pain. Her pump stopped working 3-4 days ago. She is scheduled for battery replacement next Wednesday. Now she is confused and lethargic with N/V and severe pain. In the ER, she is tachycardic and hypotensive. She is now admitted  VITAL SIGNS:  Blood pressure (!) 106/56, pulse 76, temperature 98.4 F (36.9 C), temperature source Oral, resp. rate 16, height 4\' 10"  (1.473 m), weight 68.6 kg (151 lb 3.8 oz), SpO2 91 %.  I/O:  No intake or output data in the 24 hours ending 05/04/17 0927  PHYSICAL EXAMINATION:   GENERAL:  66 y.o.-year-old patient lying in the bed with no acute distress.  EYES: Pupils equal, round, reactive to light and accommodation. No scleral icterus. HEENT: Head atraumatic, normocephalic. Oropharynx and nasopharynx clear.  NECK:  Supple, no jugular venous distention. No thyroid enlargement, no tenderness.  LUNGS: Normal breath sounds bilaterally, no wheezing, rales,rhonchi or crepitation. No use of accessory   muscles of respiration.  CARDIOVASCULAR: S1, S2 normal. No murmurs, rubs, or gallops.  ABDOMEN: Soft, nontender,  nondistended. Bowel sounds present. No organomegaly or mass.  EXTREMITIES: No pedal edema, cyanosis, or clubbing.  NEUROLOGIC: pt is alert, agitated and crying. following commands, moves limbs but due to c/o back pain, does not move much. PSYCHIATRIC: The patient is alert and very anxious.  SKIN: No obvious rash, lesion, or ulcer.   DATA REVIEW:   CBC  Recent Labs Lab 04/28/17 0439  WBC 17.8*  HGB 11.8*  HCT 35.6  PLT 383    Chemistries   Recent Labs Lab 04/28/17 0439  NA 140  K 3.7  CL 108  CO2 25  GLUCOSE 154*  BUN 15  CREATININE 0.58  CALCIUM 8.1*  MG 1.8    Cardiac Enzymes  Recent Labs Lab 04/28/17 1849  TROPONINI 0.21*    Microbiology Results  Results for orders placed or performed during the hospital encounter of 04/25/17  Culture, blood (routine x 2)     Status: None   Collection Time: 04/25/17 11:54 AM  Result Value Ref Range Status   Specimen Description BLOOD BLOOD RIGHT HAND  Final   Special Requests   Final    BOTTLES DRAWN AEROBIC AND ANAEROBIC Blood Culture adequate volume   Culture NO GROWTH 5 DAYS  Final   Report Status 04/30/2017 FINAL  Final  Culture, blood (routine x 2)     Status: None   Collection Time: 04/25/17 11:54 AM  Result Value Ref Range Status   Specimen Description BLOOD BLOOD RIGHT ARM  Final   Special Requests   Final    BOTTLES DRAWN AEROBIC AND ANAEROBIC Blood Culture adequate volume   Culture NO GROWTH 5 DAYS  Final   Report Status 04/30/2017 FINAL  Final  MRSA PCR Screening     Status: None   Collection Time: 04/25/17  5:22 PM  Result Value Ref Range Status   MRSA by PCR NEGATIVE NEGATIVE Final    Comment:        The GeneXpert MRSA Assay (FDA approved for NASAL specimens only), is one component of a comprehensive MRSA colonization surveillance program. It is not intended to diagnose MRSA infection nor to guide or monitor treatment for MRSA infections.     RADIOLOGY:  No results found.  EKG:   Orders  placed or performed during the hospital encounter of 04/25/17  . EKG 12-Lead  . EKG 12-Lead  . EKG 12-Lead  . EKG 12-Lead  . ED EKG  . ED EKG  . EKG 12-Lead  . EKG 12-Lead  . EKG 12-Lead  . EKG 12-Lead  . EKG 12-Lead  . EKG 12-Lead  . EKG 12-Lead  . EKG 12-Lead  . EKG 12-Lead  . EKG 12-Lead  . EKG      Management plans discussed with the patient, family and they are in agreement.  CODE STATUS:  Code Status History    Date Active Date Inactive Code Status Order ID Comments User Context   04/25/2017  4:15 PM 05/01/2017  5:24 PM Full Code [id number]  Idelle Crouch, MD ED    Advance Directive Documentation     Most Recent Value  Type of Advance Directive  Living will  Pre-existing out of facility DNR order (yellow form or pink MOST form)  -  "MOST" Form in Place?  -      TOTAL TIME TAKING CARE OF THIS PATIENT: 35 minutes.    Vaughan Basta M.D on 05/04/2017 at 9:27 AM  Between 7am to 6pm - Pager - [phone]  After 6pm go to [email or web address] - password [email or web address] [facility]  Office  [phone]  CC: Primary  care physician; Kathrine Haddock, NP   Note: This dictation was prepared with Dragon dictation along with smaller phrase technology. Any transcriptional errors that result from this process are unintentional.

## 2017-05-05 DIAGNOSIS — J449 Chronic obstructive pulmonary disease, unspecified: Secondary | ICD-10-CM | POA: Diagnosis not present

## 2017-05-08 ENCOUNTER — Ambulatory Visit (INDEPENDENT_AMBULATORY_CARE_PROVIDER_SITE_OTHER): Payer: Medicare HMO | Admitting: Unknown Physician Specialty

## 2017-05-08 ENCOUNTER — Encounter: Payer: Self-pay | Admitting: Unknown Physician Specialty

## 2017-05-08 DIAGNOSIS — E1122 Type 2 diabetes mellitus with diabetic chronic kidney disease: Secondary | ICD-10-CM

## 2017-05-08 DIAGNOSIS — N183 Chronic kidney disease, stage 3 (moderate): Secondary | ICD-10-CM

## 2017-05-08 DIAGNOSIS — E0822 Diabetes mellitus due to underlying condition with diabetic chronic kidney disease: Secondary | ICD-10-CM

## 2017-05-08 DIAGNOSIS — Z794 Long term (current) use of insulin: Secondary | ICD-10-CM | POA: Diagnosis not present

## 2017-05-08 DIAGNOSIS — F419 Anxiety disorder, unspecified: Secondary | ICD-10-CM | POA: Diagnosis not present

## 2017-05-08 DIAGNOSIS — I129 Hypertensive chronic kidney disease with stage 1 through stage 4 chronic kidney disease, or unspecified chronic kidney disease: Secondary | ICD-10-CM

## 2017-05-08 DIAGNOSIS — G894 Chronic pain syndrome: Secondary | ICD-10-CM | POA: Diagnosis not present

## 2017-05-08 MED ORDER — METFORMIN HCL 1000 MG PO TABS: 1000.0000 mg | ORAL_TABLET | Freq: Two times a day (BID) | ORAL | 3 refills | Status: DC

## 2017-05-08 MED ORDER — ALPRAZOLAM 0.5 MG PO TABS: 0.5000 mg | ORAL_TABLET | Freq: Three times a day (TID) | ORAL | 0 refills | Status: DC

## 2017-05-08 NOTE — Assessment & Plan Note (Signed)
Continue f/u with the pain clinic and follow through with replacement of inter-thecal pump

## 2017-05-08 NOTE — Progress Notes (Signed)
BP (!) 152/74   Pulse (!) 101   Temp 98 F (36.7 C)   Wt 144 lb 9.6 oz (65.6 kg)   LMP  (LMP Unknown)   SpO2 96%   BMI 30.22 kg/m    Subjective:    [name], female    DOB: January 11, 1951, 66 y.o.   MRN: [id number]  HPI: Jordan Chan is a 66 y.o. female  Chief Complaint  Patient presents with  . Hospitalization Follow-up   Pt presents very tearful due to her back pain.  She presented to the ED 9/22 with opioid withdrawal symptoms due to her inter-thecal morphine pump malfunctioning.  She was discharged 9/28 with instructions to f/u at the pain clinic in 3-4 days.  She has been there.  Her pain clinic is Dr Kandace Blitz of the [facility] in [place].  Plan is to replace the pump and she has an appointment with anesthesia on 10/10.     She was discharged on 60 mg of MS Contin taken every 8 hours.  I reviewed note from Dr. Consuela Mimes who came to see pt. In the hospital and it seems she was taking 270 mg/day of interthecal morphine.  She states her back is hurting badly but not all the time.    In the meantime, all she does is cry all the time.  She would like her xanax refilled which she was given while in the hospital. She feels she needs this until her pump is replaced.     Diabetes Taken off her Trulicity and Metformin while in the hospital.  States sugar was very high such as the 400's.  Back to taking her 36 units of Levimir.  Trulicity makes her nauseated   Relevant past medical, surgical, family and social history reviewed and updated as indicated. Interim medical history since our last visit reviewed. Allergies and medications reviewed and updated.  Review of Systems  Per HPI unless specifically indicated above     Objective:    BP (!) 152/74   Pulse (!) 101   Temp 98 F (36.7 C)   Wt 144 lb 9.6 oz (65.6 kg)   LMP  (LMP Unknown)   SpO2 96%   BMI 30.22 kg/m   Wt Readings from Last 3 Encounters:  05/08/17 144 lb 9.6  oz (65.6 kg)  04/30/17 151 lb 3.8 oz (68.6 kg)  04/13/17 150 lb 6.4 oz (68.2 kg)    Physical Exam  Constitutional: She appears distressed.  Cardiovascular: Normal rate, regular rhythm and normal heart sounds.   Pulmonary/Chest: Effort normal and breath sounds normal.  Psychiatric: Her affect is labile.    Results for orders placed or performed during the hospital encounter of 04/25/17  Culture, blood (routine x 2)  Result Value Ref Range   Specimen Description BLOOD BLOOD RIGHT HAND    Special Requests      BOTTLES DRAWN AEROBIC AND ANAEROBIC Blood Culture adequate volume   Culture NO GROWTH 5 DAYS    Report Status 04/30/2017 FINAL   Culture, blood (routine x 2)  Result Value Ref Range   Specimen Description BLOOD BLOOD RIGHT ARM    Special Requests      BOTTLES DRAWN AEROBIC AND ANAEROBIC Blood Culture adequate volume   Culture NO GROWTH 5 DAYS    Report Status 04/30/2017 FINAL   MRSA PCR Screening  Result Value Ref Range   MRSA by PCR NEGATIVE NEGATIVE  Glucose, capillary  Result Value Ref Range  Glucose-Capillary 230 (H) 65 - 99 mg/dL  Comprehensive metabolic panel  Result Value Ref Range   Sodium 138 135 - 145 mmol/L   Potassium 3.8 3.5 - 5.1 mmol/L   Chloride 101 101 - 111 mmol/L   CO2 27 22 - 32 mmol/L   Glucose, Bld 210 (H) 65 - 99 mg/dL   BUN 11 6 - 20 mg/dL   Creatinine, Ser 0.63 0.44 - 1.00 mg/dL   Calcium 9.7 8.9 - 10.3 mg/dL   Total Protein 8.8 (H) 6.5 - 8.1 g/dL   Albumin 4.2 3.5 - 5.0 g/dL   AST 25 15 - 41 U/L   ALT 16 14 - 54 U/L   Alkaline Phosphatase 61 38 - 126 U/L   Total Bilirubin 0.6 0.3 - 1.2 mg/dL   GFR calc non Af Amer >60 >60 mL/min   GFR calc Af Amer >60 >60 mL/min   Anion gap 10 5 - 15  Troponin I  Result Value Ref Range   Troponin I <0.03 <0.03 ng/mL  CBC with Differential  Result Value Ref Range   WBC 18.3 (H) 3.6 - 11.0 K/uL   RBC 4.97 3.80 - 5.20 MIL/uL   Hemoglobin 14.6 12.0 - 16.0 g/dL   HCT 43.6 35.0 - 47.0 %   MCV 87.6  80.0 - 100.0 fL   MCH 29.3 26.0 - 34.0 pg   MCHC 33.5 32.0 - 36.0 g/dL   RDW 15.3 (H) 11.5 - 14.5 %   Platelets 443 (H) 150 - 440 K/uL   Neutrophils Relative % 82 %   Neutro Abs 15.0 (H) 1.4 - 6.5 K/uL   Lymphocytes Relative 11 %   Lymphs Abs 2.1 1.0 - 3.6 K/uL   Monocytes Relative 6 %   Monocytes Absolute 1.1 (H) 0.2 - 0.9 K/uL   Eosinophils Relative 0 %   Eosinophils Absolute 0.0 0 - 0.7 K/uL   Basophils Relative 1 %   Basophils Absolute 0.1 0 - 0.1 K/uL  Urinalysis, Complete w Microscopic  Result Value Ref Range   Color, Urine YELLOW (A) YELLOW   APPearance CLEAR (A) CLEAR   Specific Gravity, Urine 1.017 1.005 - 1.030   pH 5.0 5.0 - 8.0   Glucose, UA 50 (A) NEGATIVE mg/dL   Hgb urine dipstick SMALL (A) NEGATIVE   Bilirubin Urine NEGATIVE NEGATIVE   Ketones, ur 20 (A) NEGATIVE mg/dL   Protein, ur 30 (A) NEGATIVE mg/dL   Nitrite NEGATIVE NEGATIVE   Leukocytes, UA NEGATIVE NEGATIVE   RBC / HPF 6-30 0 - 5 RBC/hpf   WBC, UA 0-5 0 - 5 WBC/hpf   Bacteria, UA NONE SEEN NONE SEEN   Squamous Epithelial / LPF NONE SEEN NONE SEEN   Mucus PRESENT   Comprehensive metabolic panel  Result Value Ref Range   Sodium 139 135 - 145 mmol/L   Potassium 3.9 3.5 - 5.1 mmol/L   Chloride 107 101 - 111 mmol/L   CO2 24 22 - 32 mmol/L   Glucose, Bld 215 (H) 65 - 99 mg/dL   BUN 11 6 - 20 mg/dL   Creatinine, Ser 0.70 0.44 - 1.00 mg/dL   Calcium 8.4 (L) 8.9 - 10.3 mg/dL   Total Protein 7.3 6.5 - 8.1 g/dL   Albumin 3.3 (L) 3.5 - 5.0 g/dL   AST 25 15 - 41 U/L   ALT 13 (L) 14 - 54 U/L   Alkaline Phosphatase 49 38 - 126 U/L   Total Bilirubin 0.6 0.3 - 1.2 mg/dL  GFR calc non Af Amer >60 >60 mL/min   GFR calc Af Amer >60 >60 mL/min   Anion gap 8 5 - 15  CBC  Result Value Ref Range   WBC 20.8 (H) 3.6 - 11.0 K/uL   RBC 4.44 3.80 - 5.20 MIL/uL   Hemoglobin 13.0 12.0 - 16.0 g/dL   HCT 39.2 35.0 - 47.0 %   MCV 88.2 80.0 - 100.0 fL   MCH 29.4 26.0 - 34.0 pg   MCHC 33.3 32.0 - 36.0 g/dL   RDW  15.8 (H) 11.5 - 14.5 %   Platelets 422 150 - 440 K/uL  Glucose, capillary  Result Value Ref Range   Glucose-Capillary 213 (H) 65 - 99 mg/dL  Glucose, capillary  Result Value Ref Range   Glucose-Capillary 156 (H) 65 - 99 mg/dL   Comment 1 Notify RN    Comment 2 Document in Chart   Glucose, capillary  Result Value Ref Range   Glucose-Capillary 196 (H) 65 - 99 mg/dL   Comment 1 Document in Chart   Glucose, capillary  Result Value Ref Range   Glucose-Capillary 166 (H) 65 - 99 mg/dL  CBC  Result Value Ref Range   WBC 15.6 (H) 3.6 - 11.0 K/uL   RBC 3.84 3.80 - 5.20 MIL/uL   Hemoglobin 11.5 (L) 12.0 - 16.0 g/dL   HCT 34.0 (L) 35.0 - 47.0 %   MCV 88.6 80.0 - 100.0 fL   MCH 30.0 26.0 - 34.0 pg   MCHC 33.9 32.0 - 36.0 g/dL   RDW 15.8 (H) 11.5 - 14.5 %   Platelets 333 150 - 440 K/uL  Basic metabolic panel  Result Value Ref Range   Sodium 140 135 - 145 mmol/L   Potassium 4.0 3.5 - 5.1 mmol/L   Chloride 111 101 - 111 mmol/L   CO2 21 (L) 22 - 32 mmol/L   Glucose, Bld 269 (H) 65 - 99 mg/dL   BUN 13 6 - 20 mg/dL   Creatinine, Ser 0.54 0.44 - 1.00 mg/dL   Calcium 7.6 (L) 8.9 - 10.3 mg/dL   GFR calc non Af Amer >60 >60 mL/min   GFR calc Af Amer >60 >60 mL/min   Anion gap 8 5 - 15  Glucose, capillary  Result Value Ref Range   Glucose-Capillary 160 (H) 65 - 99 mg/dL   Comment 1 Repeat Test   Glucose, capillary  Result Value Ref Range   Glucose-Capillary 268 (H) 65 - 99 mg/dL  Glucose, capillary  Result Value Ref Range   Glucose-Capillary 312 (H) 65 - 99 mg/dL  Glucose, capillary  Result Value Ref Range   Glucose-Capillary 321 (H) 65 - 99 mg/dL  Troponin I  Result Value Ref Range   Troponin I 0.39 (HH) <0.03 ng/mL  Troponin I  Result Value Ref Range   Troponin I 0.32 (HH) <0.03 ng/mL  Troponin I  Result Value Ref Range   Troponin I 0.30 (HH) <0.03 ng/mL  Troponin I  Result Value Ref Range   Troponin I 0.27 (HH) <0.03 ng/mL  Glucose, capillary  Result Value Ref Range    Glucose-Capillary 290 (H) 65 - 99 mg/dL  Heparin level (unfractionated)  Result Value Ref Range   Heparin Unfractionated 0.31 0.30 - 0.70 IU/mL  Basic metabolic panel  Result Value Ref Range   Sodium 140 135 - 145 mmol/L   Potassium 3.7 3.5 - 5.1 mmol/L   Chloride 108 101 - 111 mmol/L   CO2 25 22 -  32 mmol/L   Glucose, Bld 154 (H) 65 - 99 mg/dL   BUN 15 6 - 20 mg/dL   Creatinine, Ser 0.58 0.44 - 1.00 mg/dL   Calcium 8.1 (L) 8.9 - 10.3 mg/dL   GFR calc non Af Amer >60 >60 mL/min   GFR calc Af Amer >60 >60 mL/min   Anion gap 7 5 - 15  CBC  Result Value Ref Range   WBC 17.8 (H) 3.6 - 11.0 K/uL   RBC 4.02 3.80 - 5.20 MIL/uL   Hemoglobin 11.8 (L) 12.0 - 16.0 g/dL   HCT 35.6 35.0 - 47.0 %   MCV 88.5 80.0 - 100.0 fL   MCH 29.3 26.0 - 34.0 pg   MCHC 33.1 32.0 - 36.0 g/dL   RDW 16.3 (H) 11.5 - 14.5 %   Platelets 383 150 - 440 K/uL  Protime-INR  Result Value Ref Range   Prothrombin Time 15.1 11.4 - 15.2 seconds   INR 1.20   Magnesium  Result Value Ref Range   Magnesium 1.8 1.7 - 2.4 mg/dL  Glucose, capillary  Result Value Ref Range   Glucose-Capillary 189 (H) 65 - 99 mg/dL  Heparin level (unfractionated)  Result Value Ref Range   Heparin Unfractionated 0.62 0.30 - 0.70 IU/mL  APTT  Result Value Ref Range   aPTT 76 (H) 24 - 36 seconds  Protime-INR  Result Value Ref Range   Prothrombin Time 15.5 (H) 11.4 - 15.2 seconds   INR 1.24   Troponin I (q 6hr x 3)  Result Value Ref Range   Troponin I 0.25 (HH) <0.03 ng/mL  Troponin I (q 6hr x 3)  Result Value Ref Range   Troponin I 0.20 (HH) <0.03 ng/mL  Troponin I (q 6hr x 3)  Result Value Ref Range   Troponin I 0.21 (HH) <0.03 ng/mL  Glucose, capillary  Result Value Ref Range   Glucose-Capillary 123 (H) 65 - 99 mg/dL  Glucose, capillary  Result Value Ref Range   Glucose-Capillary 123 (H) 65 - 99 mg/dL  Glucose, capillary  Result Value Ref Range   Glucose-Capillary 149 (H) 65 - 99 mg/dL  Glucose, capillary  Result Value  Ref Range   Glucose-Capillary 163 (H) 65 - 99 mg/dL  Glucose, capillary  Result Value Ref Range   Glucose-Capillary 198 (H) 65 - 99 mg/dL  Glucose, capillary  Result Value Ref Range   Glucose-Capillary 146 (H) 65 - 99 mg/dL  Glucose, capillary  Result Value Ref Range   Glucose-Capillary 164 (H) 65 - 99 mg/dL  Glucose, capillary  Result Value Ref Range   Glucose-Capillary 159 (H) 65 - 99 mg/dL  Glucose, capillary  Result Value Ref Range   Glucose-Capillary 226 (H) 65 - 99 mg/dL  Glucose, capillary  Result Value Ref Range   Glucose-Capillary 199 (H) 65 - 99 mg/dL  Urinalysis, Complete w Microscopic  Result Value Ref Range   Color, Urine YELLOW (A) YELLOW   APPearance HAZY (A) CLEAR   Specific Gravity, Urine 1.008 1.005 - 1.030   pH 6.0 5.0 - 8.0   Glucose, UA NEGATIVE NEGATIVE mg/dL   Hgb urine dipstick NEGATIVE NEGATIVE   Bilirubin Urine NEGATIVE NEGATIVE   Ketones, ur NEGATIVE NEGATIVE mg/dL   Protein, ur NEGATIVE NEGATIVE mg/dL   Nitrite POSITIVE (A) NEGATIVE   Leukocytes, UA LARGE (A) NEGATIVE   RBC / HPF 0-5 0 - 5 RBC/hpf   WBC, UA TOO NUMEROUS TO COUNT 0 - 5 WBC/hpf   Bacteria, UA MANY (  A) NONE SEEN   Squamous Epithelial / LPF 0-5 (A) NONE SEEN  Glucose, capillary  Result Value Ref Range   Glucose-Capillary 264 (H) 65 - 99 mg/dL   Comment 1 Notify RN   Glucose, capillary  Result Value Ref Range   Glucose-Capillary 130 (H) 65 - 99 mg/dL   Comment 1 Notify RN   Glucose, capillary  Result Value Ref Range   Glucose-Capillary 187 (H) 65 - 99 mg/dL  Glucose, capillary  Result Value Ref Range   Glucose-Capillary 277 (H) 65 - 99 mg/dL   Comment 1 Notify RN   ECHOCARDIOGRAM COMPLETE  Result Value Ref Range   Weight 2,539.7 oz   Height 58 in   BP 119/70 mmHg      Assessment & Plan:   Problem List Items Addressed This Visit      Unprioritized   Anxiety    Acute due to recent hospitalization events and withdrawal.  Refill Xanax but pt understands this is just  until pump is replaced      Relevant Medications   ALPRAZolam (XANAX) 0.5 MG tablet   Benign hypertension with chronic kidney disease    Elevated today due to acute anxiey      Diabetes mellitus with chronic kidney disease (HCC)    BS high.  Restart Metformin 1000 mg BID.  Hold Trulicity as It makes her nauseated      Relevant Medications   metFORMIN (GLUCOPHAGE) 1000 MG tablet   Pain syndrome, chronic    Continue f/u with the pain clinic and follow through with replacement of inter-thecal pump          Follow up plan: Return in about 4 weeks (around 06/05/2017).

## 2017-05-08 NOTE — Assessment & Plan Note (Signed)
Elevated today due to acute anxiey

## 2017-05-08 NOTE — Assessment & Plan Note (Signed)
Acute due to recent hospitalization events and withdrawal.  Refill Xanax but pt understands this is just until pump is replaced

## 2017-05-08 NOTE — Assessment & Plan Note (Signed)
BS high.  Restart Metformin 1000 mg BID.  Hold Trulicity as It makes her nauseated

## 2017-05-11 DIAGNOSIS — J449 Chronic obstructive pulmonary disease, unspecified: Secondary | ICD-10-CM | POA: Diagnosis not present

## 2017-05-11 DIAGNOSIS — R0609 Other forms of dyspnea: Secondary | ICD-10-CM | POA: Diagnosis not present

## 2017-05-11 DIAGNOSIS — G4734 Idiopathic sleep related nonobstructive alveolar hypoventilation: Secondary | ICD-10-CM | POA: Diagnosis not present

## 2017-05-11 DIAGNOSIS — R918 Other nonspecific abnormal finding of lung field: Secondary | ICD-10-CM | POA: Diagnosis not present

## 2017-05-15 DIAGNOSIS — I5189 Other ill-defined heart diseases: Secondary | ICD-10-CM | POA: Diagnosis not present

## 2017-05-15 DIAGNOSIS — E119 Type 2 diabetes mellitus without complications: Secondary | ICD-10-CM | POA: Diagnosis not present

## 2017-05-15 DIAGNOSIS — R0989 Other specified symptoms and signs involving the circulatory and respiratory systems: Secondary | ICD-10-CM | POA: Diagnosis not present

## 2017-05-15 DIAGNOSIS — I5181 Takotsubo syndrome: Secondary | ICD-10-CM | POA: Diagnosis not present

## 2017-05-15 DIAGNOSIS — Z01810 Encounter for preprocedural cardiovascular examination: Secondary | ICD-10-CM | POA: Diagnosis not present

## 2017-05-15 DIAGNOSIS — F17201 Nicotine dependence, unspecified, in remission: Secondary | ICD-10-CM | POA: Diagnosis not present

## 2017-05-18 ENCOUNTER — Other Ambulatory Visit: Payer: Self-pay | Admitting: Unknown Physician Specialty

## 2017-05-21 ENCOUNTER — Other Ambulatory Visit: Payer: Self-pay | Admitting: Unknown Physician Specialty

## 2017-05-21 NOTE — Telephone Encounter (Signed)
Received a call from patient who is having withdrawals from morphine pump and she is desperately needing the following medications. She was very emotional and stated if she could not get these medications she felt she would "blow her brains out of the back of her head".  This patient is urgent.  Thanks  Xanax clonadine Pantoprazole Furosemide Metoprolol  [facility] [address]

## 2017-05-21 NOTE — Telephone Encounter (Signed)
[place] police dept was contact for a welfare check on patient. Called non emergency line of:    [facility] in [place], [place] Address: [address], [place], [place] [address] Phone: [phone]   Police took all patient information and policeman, Karie Soda stated they would dispatch someone to that location immediately and evaluate patient for mental status. Richie said he would call back and leave information with answering service.

## 2017-05-21 NOTE — Telephone Encounter (Signed)
My recommendation would be to alert the police that she has called and is having suicidal ideation and for them to take her to the ER or at minimum do a welfare check.

## 2017-05-21 NOTE — Telephone Encounter (Signed)
Metformin was refilled on 05/08/17- other Rxs were not written by this office. If determined to be safe- needs follow up regarding her xanax as per records, was supposed to have pump replaced on 05/13/17 and no information available regarding this. Per Cheryl's last note, she was only going to get xanax refilled until she got her pump replaced. Agree with wellfare check.

## 2017-05-22 ENCOUNTER — Ambulatory Visit: Payer: Self-pay | Admitting: Family Medicine

## 2017-05-22 ENCOUNTER — Ambulatory Visit (INDEPENDENT_AMBULATORY_CARE_PROVIDER_SITE_OTHER): Payer: Medicare HMO | Admitting: Family Medicine

## 2017-05-22 ENCOUNTER — Encounter: Payer: Self-pay | Admitting: Family Medicine

## 2017-05-22 VITALS — BP 110/69 | HR 82 | Temp 97.8°F | Wt 143.2 lb

## 2017-05-22 DIAGNOSIS — F419 Anxiety disorder, unspecified: Secondary | ICD-10-CM

## 2017-05-22 DIAGNOSIS — M545 Low back pain: Secondary | ICD-10-CM | POA: Diagnosis not present

## 2017-05-22 DIAGNOSIS — E538 Deficiency of other specified B group vitamins: Secondary | ICD-10-CM

## 2017-05-22 DIAGNOSIS — G8929 Other chronic pain: Secondary | ICD-10-CM | POA: Diagnosis not present

## 2017-05-22 DIAGNOSIS — G894 Chronic pain syndrome: Secondary | ICD-10-CM | POA: Diagnosis not present

## 2017-05-22 MED ORDER — FUROSEMIDE 20 MG PO TABS: 20.0000 mg | ORAL_TABLET | Freq: Every day | ORAL | 3 refills | Status: DC

## 2017-05-22 MED ORDER — CLONIDINE HCL 0.1 MG PO TABS: 0.1000 mg | ORAL_TABLET | Freq: Three times a day (TID) | ORAL | 3 refills | Status: DC

## 2017-05-22 MED ORDER — METOPROLOL SUCCINATE ER 25 MG PO TB24: 12.5000 mg | ORAL_TABLET | Freq: Every day | ORAL | 3 refills | Status: DC

## 2017-05-22 MED ORDER — CLONAZEPAM 0.5 MG PO TABS: 0.5000 mg | ORAL_TABLET | Freq: Two times a day (BID) | ORAL | 0 refills | Status: DC | PRN

## 2017-05-22 MED ORDER — PANTOPRAZOLE SODIUM 40 MG PO TBEC: 40.0000 mg | DELAYED_RELEASE_TABLET | Freq: Two times a day (BID) | ORAL | 2 refills | Status: DC

## 2017-05-22 MED ORDER — CYANOCOBALAMIN 1000 MCG/ML IJ SOLN
1000.0000 ug | Freq: Once | INTRAMUSCULAR | Status: AC
  Administered 2017-05-22: 1000 ug via INTRAMUSCULAR

## 2017-05-22 NOTE — Assessment & Plan Note (Signed)
Follow up with pain management as needed. Continue current regimen.

## 2017-05-22 NOTE — Assessment & Plan Note (Signed)
Following with pain clinic. To have pump replacement on 06/10/17.

## 2017-05-22 NOTE — Telephone Encounter (Signed)
Called and spoke to patient. She stated she needed xanax because she would run out this weekend and she was told it was dangerous to just stop taking medication. Pt stated she was getting her medications together and was on her way down here. Pt was given appointment this afternoon to discuss w/ provider.

## 2017-05-22 NOTE — Progress Notes (Signed)
BP 110/69 (BP Location: Left Arm, Patient Position: Sitting, Cuff Size: Normal)   Pulse 82   Temp 97.8 F (36.6 C)   Wt 143 lb 4 oz (65 kg)   LMP  (LMP Unknown)   SpO2 96%   BMI 29.94 kg/m    Subjective:    [name], female    DOB: [birth year], 66 y.o.   MRN: [id number]  HPI: Jordan Chan is a 66 y.o. female  Chief Complaint  Patient presents with  . Pain  . Medication Refill    Metoprolol,Clonidine, xanax, and hydrocodone    Her pain doctor pushed off her pump replacement because he had to go out of town. She went through withdrawal in the hospital. She notes that she is through the main part of it, but still feeling jittery and very anxious and having trouble thinking and sleeping. Xanax is helping, but she is needing to take it 3x a day every day and she is still feeling anxious and out of control.  She has been seeing her pump doctor in [facility]. She is going to have her pump replaced on November 7th. She notes that her pain doctor told her that she had to see a Sales executive or they couldn't do her surgery. She notes that she had to drive to [facility] 2 days in a row- which made her back hurt even worse. She is very upset. Very anxious. Thinks that the xanax helps, but feels like she's going through this on her own except her family and that her providers are not there for her. No thoughts of hurting herself. Not sure how she'd be getting through this without the xanax. Otherwise stable no other complaints.   Relevant past medical, surgical, family and social history reviewed and updated as indicated. Interim medical history since our last visit reviewed. Allergies and medications reviewed and updated.  Review of Systems  Constitutional: Negative.   Respiratory: Negative.   Cardiovascular: Negative.   Musculoskeletal: Positive for arthralgias, back pain, gait problem, myalgias, neck pain and neck stiffness. Negative for joint swelling.   Psychiatric/Behavioral: Positive for agitation, confusion, decreased concentration, dysphoric mood and sleep disturbance. Negative for behavioral problems, hallucinations, self-injury and suicidal ideas. The patient is nervous/anxious. The patient is not hyperactive.     Per HPI unless specifically indicated above     Objective:    BP 110/69 (BP Location: Left Arm, Patient Position: Sitting, Cuff Size: Normal)   Pulse 82   Temp 97.8 F (36.6 C)   Wt 143 lb 4 oz (65 kg)   LMP  (LMP Unknown)   SpO2 96%   BMI 29.94 kg/m   Wt Readings from Last 3 Encounters:  05/22/17 143 lb 4 oz (65 kg)  05/08/17 144 lb 9.6 oz (65.6 kg)  04/30/17 151 lb 3.8 oz (68.6 kg)    Physical Exam  Constitutional: She is oriented to person, place, and time. She appears well-developed and well-nourished. No distress.  HENT:  Head: Normocephalic and atraumatic.  Right Ear: Hearing normal.  Left Ear: Hearing normal.  Nose: Nose normal.  Eyes: Conjunctivae and lids are normal. Right eye exhibits no discharge. Left eye exhibits no discharge. No scleral icterus.  Cardiovascular: Normal rate, regular rhythm, normal heart sounds and intact distal pulses.  Exam reveals no gallop and no friction rub.   No murmur heard. Pulmonary/Chest: Effort normal and breath sounds normal. No respiratory distress. She has no wheezes. She has no rales. She  exhibits no tenderness.  Musculoskeletal: Normal range of motion.  Neurological: She is alert and oriented to person, place, and time.  Skin: Skin is warm, dry and intact. No rash noted. She is not diaphoretic. No erythema. No pallor.  Psychiatric: Her speech is normal and behavior is normal. Judgment and thought content normal. Her affect is labile. Cognition and memory are normal.  Nursing note and vitals reviewed.   Results for orders placed or performed during the hospital encounter of 04/25/17  Culture, blood (routine x 2)  Result Value Ref Range   Specimen Description  BLOOD BLOOD RIGHT HAND    Special Requests      BOTTLES DRAWN AEROBIC AND ANAEROBIC Blood Culture adequate volume   Culture NO GROWTH 5 DAYS    Report Status 04/30/2017 FINAL   Culture, blood (routine x 2)  Result Value Ref Range   Specimen Description BLOOD BLOOD RIGHT ARM    Special Requests      BOTTLES DRAWN AEROBIC AND ANAEROBIC Blood Culture adequate volume   Culture NO GROWTH 5 DAYS    Report Status 04/30/2017 FINAL   MRSA PCR Screening  Result Value Ref Range   MRSA by PCR NEGATIVE NEGATIVE  Glucose, capillary  Result Value Ref Range   Glucose-Capillary 230 (H) 65 - 99 mg/dL  Comprehensive metabolic panel  Result Value Ref Range   Sodium 138 135 - 145 mmol/L   Potassium 3.8 3.5 - 5.1 mmol/L   Chloride 101 101 - 111 mmol/L   CO2 27 22 - 32 mmol/L   Glucose, Bld 210 (H) 65 - 99 mg/dL   BUN 11 6 - 20 mg/dL   Creatinine, Ser 0.63 0.44 - 1.00 mg/dL   Calcium 9.7 8.9 - 10.3 mg/dL   Total Protein 8.8 (H) 6.5 - 8.1 g/dL   Albumin 4.2 3.5 - 5.0 g/dL   AST 25 15 - 41 U/L   ALT 16 14 - 54 U/L   Alkaline Phosphatase 61 38 - 126 U/L   Total Bilirubin 0.6 0.3 - 1.2 mg/dL   GFR calc non Af Amer >60 >60 mL/min   GFR calc Af Amer >60 >60 mL/min   Anion gap 10 5 - 15  Troponin I  Result Value Ref Range   Troponin I <0.03 <0.03 ng/mL  CBC with Differential  Result Value Ref Range   WBC 18.3 (H) 3.6 - 11.0 K/uL   RBC 4.97 3.80 - 5.20 MIL/uL   Hemoglobin 14.6 12.0 - 16.0 g/dL   HCT 43.6 35.0 - 47.0 %   MCV 87.6 80.0 - 100.0 fL   MCH 29.3 26.0 - 34.0 pg   MCHC 33.5 32.0 - 36.0 g/dL   RDW 15.3 (H) 11.5 - 14.5 %   Platelets 443 (H) 150 - 440 K/uL   Neutrophils Relative % 82 %   Neutro Abs 15.0 (H) 1.4 - 6.5 K/uL   Lymphocytes Relative 11 %   Lymphs Abs 2.1 1.0 - 3.6 K/uL   Monocytes Relative 6 %   Monocytes Absolute 1.1 (H) 0.2 - 0.9 K/uL   Eosinophils Relative 0 %   Eosinophils Absolute 0.0 0 - 0.7 K/uL   Basophils Relative 1 %   Basophils Absolute 0.1 0 - 0.1 K/uL    Urinalysis, Complete w Microscopic  Result Value Ref Range   Color, Urine YELLOW (A) YELLOW   APPearance CLEAR (A) CLEAR   Specific Gravity, Urine 1.017 1.005 - 1.030   pH 5.0 5.0 - 8.0   Glucose,  UA 50 (A) NEGATIVE mg/dL   Hgb urine dipstick SMALL (A) NEGATIVE   Bilirubin Urine NEGATIVE NEGATIVE   Ketones, ur 20 (A) NEGATIVE mg/dL   Protein, ur 30 (A) NEGATIVE mg/dL   Nitrite NEGATIVE NEGATIVE   Leukocytes, UA NEGATIVE NEGATIVE   RBC / HPF 6-30 0 - 5 RBC/hpf   WBC, UA 0-5 0 - 5 WBC/hpf   Bacteria, UA NONE SEEN NONE SEEN   Squamous Epithelial / LPF NONE SEEN NONE SEEN   Mucus PRESENT   Comprehensive metabolic panel  Result Value Ref Range   Sodium 139 135 - 145 mmol/L   Potassium 3.9 3.5 - 5.1 mmol/L   Chloride 107 101 - 111 mmol/L   CO2 24 22 - 32 mmol/L   Glucose, Bld 215 (H) 65 - 99 mg/dL   BUN 11 6 - 20 mg/dL   Creatinine, Ser 0.70 0.44 - 1.00 mg/dL   Calcium 8.4 (L) 8.9 - 10.3 mg/dL   Total Protein 7.3 6.5 - 8.1 g/dL   Albumin 3.3 (L) 3.5 - 5.0 g/dL   AST 25 15 - 41 U/L   ALT 13 (L) 14 - 54 U/L   Alkaline Phosphatase 49 38 - 126 U/L   Total Bilirubin 0.6 0.3 - 1.2 mg/dL   GFR calc non Af Amer >60 >60 mL/min   GFR calc Af Amer >60 >60 mL/min   Anion gap 8 5 - 15  CBC  Result Value Ref Range   WBC 20.8 (H) 3.6 - 11.0 K/uL   RBC 4.44 3.80 - 5.20 MIL/uL   Hemoglobin 13.0 12.0 - 16.0 g/dL   HCT 39.2 35.0 - 47.0 %   MCV 88.2 80.0 - 100.0 fL   MCH 29.4 26.0 - 34.0 pg   MCHC 33.3 32.0 - 36.0 g/dL   RDW 15.8 (H) 11.5 - 14.5 %   Platelets 422 150 - 440 K/uL  Glucose, capillary  Result Value Ref Range   Glucose-Capillary 213 (H) 65 - 99 mg/dL  Glucose, capillary  Result Value Ref Range   Glucose-Capillary 156 (H) 65 - 99 mg/dL   Comment 1 Notify RN    Comment 2 Document in Chart   Glucose, capillary  Result Value Ref Range   Glucose-Capillary 196 (H) 65 - 99 mg/dL   Comment 1 Document in Chart   Glucose, capillary  Result Value Ref Range   Glucose-Capillary  166 (H) 65 - 99 mg/dL  CBC  Result Value Ref Range   WBC 15.6 (H) 3.6 - 11.0 K/uL   RBC 3.84 3.80 - 5.20 MIL/uL   Hemoglobin 11.5 (L) 12.0 - 16.0 g/dL   HCT 34.0 (L) 35.0 - 47.0 %   MCV 88.6 80.0 - 100.0 fL   MCH 30.0 26.0 - 34.0 pg   MCHC 33.9 32.0 - 36.0 g/dL   RDW 15.8 (H) 11.5 - 14.5 %   Platelets 333 150 - 440 K/uL  Basic metabolic panel  Result Value Ref Range   Sodium 140 135 - 145 mmol/L   Potassium 4.0 3.5 - 5.1 mmol/L   Chloride 111 101 - 111 mmol/L   CO2 21 (L) 22 - 32 mmol/L   Glucose, Bld 269 (H) 65 - 99 mg/dL   BUN 13 6 - 20 mg/dL   Creatinine, Ser 0.54 0.44 - 1.00 mg/dL   Calcium 7.6 (L) 8.9 - 10.3 mg/dL   GFR calc non Af Amer >60 >60 mL/min   GFR calc Af Amer >60 >60 mL/min  Anion gap 8 5 - 15  Glucose, capillary  Result Value Ref Range   Glucose-Capillary 160 (H) 65 - 99 mg/dL   Comment 1 Repeat Test   Glucose, capillary  Result Value Ref Range   Glucose-Capillary 268 (H) 65 - 99 mg/dL  Glucose, capillary  Result Value Ref Range   Glucose-Capillary 312 (H) 65 - 99 mg/dL  Glucose, capillary  Result Value Ref Range   Glucose-Capillary 321 (H) 65 - 99 mg/dL  Troponin I  Result Value Ref Range   Troponin I 0.39 (HH) <0.03 ng/mL  Troponin I  Result Value Ref Range   Troponin I 0.32 (HH) <0.03 ng/mL  Troponin I  Result Value Ref Range   Troponin I 0.30 (HH) <0.03 ng/mL  Troponin I  Result Value Ref Range   Troponin I 0.27 (HH) <0.03 ng/mL  Glucose, capillary  Result Value Ref Range   Glucose-Capillary 290 (H) 65 - 99 mg/dL  Heparin level (unfractionated)  Result Value Ref Range   Heparin Unfractionated 0.31 0.30 - 0.70 IU/mL  Basic metabolic panel  Result Value Ref Range   Sodium 140 135 - 145 mmol/L   Potassium 3.7 3.5 - 5.1 mmol/L   Chloride 108 101 - 111 mmol/L   CO2 25 22 - 32 mmol/L   Glucose, Bld 154 (H) 65 - 99 mg/dL   BUN 15 6 - 20 mg/dL   Creatinine, Ser 0.58 0.44 - 1.00 mg/dL   Calcium 8.1 (L) 8.9 - 10.3 mg/dL   GFR calc non Af  Amer >60 >60 mL/min   GFR calc Af Amer >60 >60 mL/min   Anion gap 7 5 - 15  CBC  Result Value Ref Range   WBC 17.8 (H) 3.6 - 11.0 K/uL   RBC 4.02 3.80 - 5.20 MIL/uL   Hemoglobin 11.8 (L) 12.0 - 16.0 g/dL   HCT 35.6 35.0 - 47.0 %   MCV 88.5 80.0 - 100.0 fL   MCH 29.3 26.0 - 34.0 pg   MCHC 33.1 32.0 - 36.0 g/dL   RDW 16.3 (H) 11.5 - 14.5 %   Platelets 383 150 - 440 K/uL  Protime-INR  Result Value Ref Range   Prothrombin Time 15.1 11.4 - 15.2 seconds   INR 1.20   Magnesium  Result Value Ref Range   Magnesium 1.8 1.7 - 2.4 mg/dL  Glucose, capillary  Result Value Ref Range   Glucose-Capillary 189 (H) 65 - 99 mg/dL  Heparin level (unfractionated)  Result Value Ref Range   Heparin Unfractionated 0.62 0.30 - 0.70 IU/mL  APTT  Result Value Ref Range   aPTT 76 (H) 24 - 36 seconds  Protime-INR  Result Value Ref Range   Prothrombin Time 15.5 (H) 11.4 - 15.2 seconds   INR 1.24   Troponin I (q 6hr x 3)  Result Value Ref Range   Troponin I 0.25 (HH) <0.03 ng/mL  Troponin I (q 6hr x 3)  Result Value Ref Range   Troponin I 0.20 (HH) <0.03 ng/mL  Troponin I (q 6hr x 3)  Result Value Ref Range   Troponin I 0.21 (HH) <0.03 ng/mL  Glucose, capillary  Result Value Ref Range   Glucose-Capillary 123 (H) 65 - 99 mg/dL  Glucose, capillary  Result Value Ref Range   Glucose-Capillary 123 (H) 65 - 99 mg/dL  Glucose, capillary  Result Value Ref Range   Glucose-Capillary 149 (H) 65 - 99 mg/dL  Glucose, capillary  Result Value Ref Range   Glucose-Capillary 163 (H)  65 - 99 mg/dL  Glucose, capillary  Result Value Ref Range   Glucose-Capillary 198 (H) 65 - 99 mg/dL  Glucose, capillary  Result Value Ref Range   Glucose-Capillary 146 (H) 65 - 99 mg/dL  Glucose, capillary  Result Value Ref Range   Glucose-Capillary 164 (H) 65 - 99 mg/dL  Glucose, capillary  Result Value Ref Range   Glucose-Capillary 159 (H) 65 - 99 mg/dL  Glucose, capillary  Result Value Ref Range   Glucose-Capillary  226 (H) 65 - 99 mg/dL  Glucose, capillary  Result Value Ref Range   Glucose-Capillary 199 (H) 65 - 99 mg/dL  Urinalysis, Complete w Microscopic  Result Value Ref Range   Color, Urine YELLOW (A) YELLOW   APPearance HAZY (A) CLEAR   Specific Gravity, Urine 1.008 1.005 - 1.030   pH 6.0 5.0 - 8.0   Glucose, UA NEGATIVE NEGATIVE mg/dL   Hgb urine dipstick NEGATIVE NEGATIVE   Bilirubin Urine NEGATIVE NEGATIVE   Ketones, ur NEGATIVE NEGATIVE mg/dL   Protein, ur NEGATIVE NEGATIVE mg/dL   Nitrite POSITIVE (A) NEGATIVE   Leukocytes, UA LARGE (A) NEGATIVE   RBC / HPF 0-5 0 - 5 RBC/hpf   WBC, UA TOO NUMEROUS TO COUNT 0 - 5 WBC/hpf   Bacteria, UA MANY (A) NONE SEEN   Squamous Epithelial / LPF 0-5 (A) NONE SEEN  Glucose, capillary  Result Value Ref Range   Glucose-Capillary 264 (H) 65 - 99 mg/dL   Comment 1 Notify RN   Glucose, capillary  Result Value Ref Range   Glucose-Capillary 130 (H) 65 - 99 mg/dL   Comment 1 Notify RN   Glucose, capillary  Result Value Ref Range   Glucose-Capillary 187 (H) 65 - 99 mg/dL  Glucose, capillary  Result Value Ref Range   Glucose-Capillary 277 (H) 65 - 99 mg/dL   Comment 1 Notify RN   ECHOCARDIOGRAM COMPLETE  Result Value Ref Range   Weight 2,539.7 oz   Height 58 in   BP 119/70 mmHg      Assessment & Plan:   Problem List Items Addressed This Visit      Other   Chronic back pain    Follow up with pain management as needed. Continue current regimen.       B12 deficiency    B12 shot due and given today.      Relevant Medications   cyanocobalamin ((VITAMIN B-12)) injection 1,000 mcg (Completed)   Pain syndrome, chronic    Following with pain clinic. To have pump replacement on 06/10/17.        Other Visit Diagnoses    Acute anxiety    -  Primary   Severe. Not under good control. Will change to Klonopin for more steady state. Recheck weekly until morphine pump replaced.        Follow up plan: Return Tuesday with Regular Provider as  her son is coming in that day.

## 2017-05-22 NOTE — Assessment & Plan Note (Signed)
B12 shot due and given today.

## 2017-05-25 ENCOUNTER — Telehealth: Payer: Self-pay | Admitting: Unknown Physician Specialty

## 2017-05-25 ENCOUNTER — Ambulatory Visit: Payer: Medicare HMO | Admitting: Unknown Physician Specialty

## 2017-05-25 NOTE — Telephone Encounter (Signed)
Copied from [facility] #[id number]. Topic: Quick Communication - See Telephone Encounter >> May 25, 2017  2:16 PM Jordan Chan wrote: CRM for notification. See Telephone encounter for:  05/25/17.  Needing appointment close to handicap son [name] in the morning.  Has an appointment  With Dr. Julian Hy in the morning.  Pt is very concerned regarding morphine. Offered NT/ Pt refused.

## 2017-05-26 ENCOUNTER — Ambulatory Visit: Payer: Medicare HMO | Admitting: Unknown Physician Specialty

## 2017-05-26 NOTE — Telephone Encounter (Signed)
Called and spoke to patient. Appointment has already been rescheduled for 06/09/17. Patient states she will just keep this appointment and will call if she needs to change it.

## 2017-05-27 ENCOUNTER — Other Ambulatory Visit: Payer: Self-pay | Admitting: Unknown Physician Specialty

## 2017-05-27 DIAGNOSIS — Z451 Encounter for adjustment and management of infusion pump: Secondary | ICD-10-CM | POA: Diagnosis not present

## 2017-05-27 DIAGNOSIS — E1140 Type 2 diabetes mellitus with diabetic neuropathy, unspecified: Secondary | ICD-10-CM | POA: Diagnosis not present

## 2017-05-27 DIAGNOSIS — F419 Anxiety disorder, unspecified: Secondary | ICD-10-CM | POA: Diagnosis not present

## 2017-05-27 DIAGNOSIS — G894 Chronic pain syndrome: Secondary | ICD-10-CM | POA: Diagnosis not present

## 2017-05-27 DIAGNOSIS — F329 Major depressive disorder, single episode, unspecified: Secondary | ICD-10-CM | POA: Diagnosis not present

## 2017-05-27 DIAGNOSIS — Z87891 Personal history of nicotine dependence: Secondary | ICD-10-CM | POA: Diagnosis not present

## 2017-05-27 DIAGNOSIS — M545 Low back pain: Secondary | ICD-10-CM | POA: Diagnosis not present

## 2017-05-27 DIAGNOSIS — K219 Gastro-esophageal reflux disease without esophagitis: Secondary | ICD-10-CM | POA: Diagnosis not present

## 2017-05-27 DIAGNOSIS — Z48811 Encounter for surgical aftercare following surgery on the nervous system: Secondary | ICD-10-CM | POA: Diagnosis not present

## 2017-05-27 DIAGNOSIS — E11618 Type 2 diabetes mellitus with other diabetic arthropathy: Secondary | ICD-10-CM | POA: Diagnosis not present

## 2017-05-28 DIAGNOSIS — F329 Major depressive disorder, single episode, unspecified: Secondary | ICD-10-CM | POA: Diagnosis not present

## 2017-05-28 DIAGNOSIS — E1140 Type 2 diabetes mellitus with diabetic neuropathy, unspecified: Secondary | ICD-10-CM | POA: Diagnosis not present

## 2017-05-28 DIAGNOSIS — M545 Low back pain: Secondary | ICD-10-CM | POA: Diagnosis not present

## 2017-05-28 DIAGNOSIS — Z87891 Personal history of nicotine dependence: Secondary | ICD-10-CM | POA: Diagnosis not present

## 2017-05-28 DIAGNOSIS — K219 Gastro-esophageal reflux disease without esophagitis: Secondary | ICD-10-CM | POA: Diagnosis not present

## 2017-05-28 DIAGNOSIS — E11618 Type 2 diabetes mellitus with other diabetic arthropathy: Secondary | ICD-10-CM | POA: Diagnosis not present

## 2017-05-28 DIAGNOSIS — Z451 Encounter for adjustment and management of infusion pump: Secondary | ICD-10-CM | POA: Diagnosis not present

## 2017-05-28 DIAGNOSIS — G894 Chronic pain syndrome: Secondary | ICD-10-CM | POA: Diagnosis not present

## 2017-05-28 DIAGNOSIS — F419 Anxiety disorder, unspecified: Secondary | ICD-10-CM | POA: Diagnosis not present

## 2017-06-01 ENCOUNTER — Telehealth: Payer: Self-pay

## 2017-06-01 ENCOUNTER — Encounter: Payer: Self-pay | Admitting: Family Medicine

## 2017-06-01 ENCOUNTER — Encounter: Payer: Self-pay | Admitting: Emergency Medicine

## 2017-06-01 ENCOUNTER — Ambulatory Visit (INDEPENDENT_AMBULATORY_CARE_PROVIDER_SITE_OTHER): Payer: Medicare HMO | Admitting: Family Medicine

## 2017-06-01 ENCOUNTER — Emergency Department
Admission: EM | Admit: 2017-06-01 | Discharge: 2017-06-01 | Disposition: A | Discharge status: Home / Self Care | Payer: Medicare HMO | Attending: Student in an Organized Health Care Education/Training Program | Admitting: Student in an Organized Health Care Education/Training Program

## 2017-06-01 ENCOUNTER — Emergency Department: Payer: Medicare HMO

## 2017-06-01 VITALS — BP 121/74 | HR 110 | Wt 139.5 lb

## 2017-06-01 DIAGNOSIS — J449 Chronic obstructive pulmonary disease, unspecified: Secondary | ICD-10-CM | POA: Insufficient documentation

## 2017-06-01 DIAGNOSIS — N183 Chronic kidney disease, stage 3 (moderate): Secondary | ICD-10-CM | POA: Insufficient documentation

## 2017-06-01 DIAGNOSIS — E1140 Type 2 diabetes mellitus with diabetic neuropathy, unspecified: Secondary | ICD-10-CM | POA: Insufficient documentation

## 2017-06-01 DIAGNOSIS — Z794 Long term (current) use of insulin: Secondary | ICD-10-CM | POA: Diagnosis not present

## 2017-06-01 DIAGNOSIS — R079 Chest pain, unspecified: Secondary | ICD-10-CM

## 2017-06-01 DIAGNOSIS — R0789 Other chest pain: Secondary | ICD-10-CM | POA: Diagnosis present

## 2017-06-01 DIAGNOSIS — Z79899 Other long term (current) drug therapy: Secondary | ICD-10-CM | POA: Insufficient documentation

## 2017-06-01 DIAGNOSIS — Z7982 Long term (current) use of aspirin: Secondary | ICD-10-CM | POA: Diagnosis not present

## 2017-06-01 DIAGNOSIS — Z87891 Personal history of nicotine dependence: Secondary | ICD-10-CM | POA: Insufficient documentation

## 2017-06-01 DIAGNOSIS — S299XXA Unspecified injury of thorax, initial encounter: Secondary | ICD-10-CM | POA: Diagnosis not present

## 2017-06-01 DIAGNOSIS — I129 Hypertensive chronic kidney disease with stage 1 through stage 4 chronic kidney disease, or unspecified chronic kidney disease: Secondary | ICD-10-CM | POA: Insufficient documentation

## 2017-06-01 DIAGNOSIS — E1122 Type 2 diabetes mellitus with diabetic chronic kidney disease: Secondary | ICD-10-CM | POA: Insufficient documentation

## 2017-06-01 DIAGNOSIS — R072 Precordial pain: Secondary | ICD-10-CM | POA: Diagnosis not present

## 2017-06-01 DIAGNOSIS — J45909 Unspecified asthma, uncomplicated: Secondary | ICD-10-CM | POA: Diagnosis not present

## 2017-06-01 LAB — CBC
HCT: 40.6 % (ref 35.0–47.0)
Hemoglobin: 13.1 g/dL (ref 12.0–16.0)
MCH: 28.9 pg (ref 26.0–34.0)
MCHC: 32.4 g/dL (ref 32.0–36.0)
MCV: 89.3 fL (ref 80.0–100.0)
Platelets: 370 10*3/uL (ref 150–440)
RBC: 4.54 MIL/uL (ref 3.80–5.20)
RDW: 14.5 % (ref 11.5–14.5)
WBC: 12.6 10*3/uL — ABNORMAL HIGH (ref 3.6–11.0)

## 2017-06-01 LAB — BASIC METABOLIC PANEL WITH GFR
BUN: 8 mg/dL (ref 6–20)
Calcium: 9.1 mg/dL (ref 8.9–10.3)
GFR calc non Af Amer: >60 mL/min (ref >60)
Glucose, Bld: 350 mg/dL — ABNORMAL HIGH (ref 65–99)

## 2017-06-01 LAB — BASIC METABOLIC PANEL
Anion gap: 13 (ref 5–15)
CO2: 32 mmol/L (ref 22–32)
Chloride: 90 mmol/L — ABNORMAL LOW (ref 101–111)
Creatinine, Ser: 0.85 mg/dL (ref 0.44–1.00)
GFR calc Af Amer: >60 mL/min (ref >60)
Potassium: 4.2 mmol/L (ref 3.5–5.1)
Sodium: 135 mmol/L (ref 135–145)

## 2017-06-01 LAB — TROPONIN I: Troponin I: <0.03 ng/mL (ref <0.03)

## 2017-06-01 MED ORDER — MORPHINE SULFATE (PF) 4 MG/ML IV SOLN: 4.0000 mg | INTRAVENOUS | Status: DC | PRN

## 2017-06-01 MED ORDER — HYDROXYZINE HCL 25 MG PO TABS
25.0000 mg | ORAL_TABLET | Freq: Once | ORAL | Status: AC
  Administered 2017-06-01: 25 mg via ORAL
  Filled 2017-06-01: qty 1

## 2017-06-01 MED ORDER — CLONAZEPAM 0.5 MG PO TABS: 0.5000 mg | ORAL_TABLET | Freq: Once | ORAL | Status: DC
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
## 2017-06-01 NOTE — Telephone Encounter (Signed)
S/w Tiffany at [facility] who called as pt is in their office with complaints of chest pain. Tiffany states pt describes it as intermittent left chest pain, 7 out of 10 pain last evening. Tiffany reports pt was unable to sleep last night due to pain. Some nausea. They report pt does not have chest pain at this time but has had it today. Denies left arm or jaw pain, vomiting or diaphoresis.  Pt saw cardiologist in [place] on Oct 12 for pre-op clearance. She is not a patient in this office. Advised Tiffany to consider having pt proceed to ED either private vehicle (but she should not drive) or call [phone]. Tiffany verbalized understanding with no further questions at this time.
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
## 2017-06-01 NOTE — Patient Instructions (Addendum)
If the lidocaine patches are too expensive, just get the over the counter patches, they are almost as good and less expensive.   Chest Contusion, Adult A chest contusion is a deep bruise to the chest. Contusions are usually the result of a blunt injury to tissues under the skin. The injury can damage the small blood vessels under the skin, which causes bleeding under the skin. The skin overlying the contusion may turn blue, purple, or yellow. Minor injuries may give you a painless contusion, but more severe contusions may stay painful and swollen for a few weeks. What are the causes? A contusion is usually caused by a hard hit (blow), trauma, or direct force to your chest, such as:  A motor vehicle accident.  Falls.  Bicycle injuries.  Contact sport injuries.  What increases the risk? You may be at a higher risk for a chest contusion if you play a sport in which falls and contact are common, such as football or soccer. What are the signs or symptoms? Symptoms of this condition include:  Chest swelling.  Pain and tenderness of the chest.  Discomfort with certain movements of the upper torso.  Discoloration of the chest. The area may have redness and then turn blue, purple, or yellow.  Discomfort when taking deep breaths.  How is this diagnosed? A chest contusion is diagnosed from a physical exam and your medical history. An X-ray may be needed to determine if there were any associated injuries, such as broken bones (fractures). Sometimes other tests such as CT scans, ultrasounds, or MRIs may be needed if internal injuries are suspected. A test that shows the amount of oxygen in your blood (pulse oximetry) may be done if you have trouble breathing. How is this treated? Often, the best treatment for a chest contusion is resting and applying ice to the injured area. Deep-breathing exercises may be recommended to reduce the risk of pneumonia. Oxygen therapy may be given if you have  trouble breathing or have low oxygen levels. Over-the-counter medicines may also be recommended for pain control. Follow these instructions at home:  If directed, apply ice to the injured area. ? Put ice in a plastic bag. ? Place a towel between your skin and the bag. ? Leave the ice on for 20 minutes, 2-3 times per day.  Take over-the-counter and prescription medicines only as told by your health care provider.  Do any deep-breathing exercises as told by your health care provider, if this applies.  Do not lie down flat on your back. Keep your head and chest raised (elevated) when you are resting or sleeping.  Do not use any products that contain nicotine or tobacco, such as cigarettes and e-cigarettes. If you need help quitting, ask your health care provider.  Do not lift anything that causes you discomfort or pain. Contact a health care provider if:  Your swelling or pain is not relieved with medicines or treatment.  You have increased bruising or swelling.  You have pain that is getting worse.  Your symptoms have not improved after one week. Get help right away if:  You have a sudden, significant increase in pain.  You have difficulty breathing.  You have dizziness, weakness, or fainting.  You have blood in your urine or stool.  You cough up blood or you vomit blood. Summary  A chest contusion is a deep bruise to the chest that is usually caused by a hard hit, trauma, or direct force to your chest.  Treatment for a chest contusion may include resting and applying ice to the injured area.  Contact a health care provider if you have problems breathing or if your pain does not improve with treatment. This information is not intended to replace advice given to you by your health care provider. Make sure you discuss any questions you have with your health care provider. Document Released: 04/15/2001 Document Revised: 04/17/2016 Document Reviewed: 04/17/2016 Elsevier  Interactive Patient Education  [employer].

## 2017-06-01 NOTE — ED Notes (Signed)
Addition to triage note.  Patient has long term morphine pump for chronic back pain.

## 2017-06-02 ENCOUNTER — Other Ambulatory Visit: Payer: Self-pay | Admitting: Unknown Physician Specialty

## 2017-06-05 DIAGNOSIS — J449 Chronic obstructive pulmonary disease, unspecified: Secondary | ICD-10-CM | POA: Diagnosis not present

## 2017-06-09 ENCOUNTER — Ambulatory Visit: Payer: Medicare HMO | Admitting: Unknown Physician Specialty

## 2017-06-09 ENCOUNTER — Encounter: Payer: Self-pay | Admitting: Unknown Physician Specialty

## 2017-06-09 VITALS — BP 106/73 | HR 102 | Temp 97.9°F | Wt 135.2 lb

## 2017-06-09 DIAGNOSIS — E0822 Diabetes mellitus due to underlying condition with diabetic chronic kidney disease: Secondary | ICD-10-CM

## 2017-06-09 DIAGNOSIS — J209 Acute bronchitis, unspecified: Secondary | ICD-10-CM

## 2017-06-09 DIAGNOSIS — J449 Chronic obstructive pulmonary disease, unspecified: Secondary | ICD-10-CM | POA: Diagnosis not present

## 2017-06-09 DIAGNOSIS — N183 Chronic kidney disease, stage 3 (moderate): Secondary | ICD-10-CM | POA: Diagnosis not present

## 2017-06-09 DIAGNOSIS — J42 Unspecified chronic bronchitis: Secondary | ICD-10-CM

## 2017-06-09 DIAGNOSIS — Z794 Long term (current) use of insulin: Secondary | ICD-10-CM

## 2017-06-09 DIAGNOSIS — I129 Hypertensive chronic kidney disease with stage 1 through stage 4 chronic kidney disease, or unspecified chronic kidney disease: Secondary | ICD-10-CM

## 2017-06-09 LAB — BAYER DCA HB A1C WAIVED: HB A1C (BAYER DCA - WAIVED): 9.2 % — ABNORMAL HIGH (ref <7.0)

## 2017-06-09 MED ORDER — DOXYCYCLINE HYCLATE 100 MG PO TABS: 100.0000 mg | ORAL_TABLET | Freq: Two times a day (BID) | ORAL | 0 refills | Status: DC

## 2017-06-09 MED ORDER — METOPROLOL SUCCINATE ER 25 MG PO TB24: 12.5000 mg | ORAL_TABLET | Freq: Every day | ORAL | 3 refills | Status: DC

## 2017-06-09 NOTE — Patient Instructions (Signed)
Base your long acting insulin on your fasting (usually in the morning) blood sugar.  Increase long acting (daily insulin) 2 units if fasting blood sugar is greater than 130.  Decrease by 2 units if fasting blood sugar is less than 95.    

## 2017-06-09 NOTE — Progress Notes (Signed)
BP 106/73 (BP Location: Left Arm, Cuff Size: Normal)   Pulse (!) 102   Temp 97.9 F (36.6 C) (Oral)   Wt 135 lb 3.2 oz (61.3 kg)   LMP  (LMP Unknown)   SpO2 92%   BMI 28.26 kg/m    Subjective:    [name], female    DOB: 01-27-1951, 67 y.o.   MRN: [id number]  HPI: Jordan Chan is a 66 y.o. female  Chief Complaint  Patient presents with  . Anxiety  . Diabetes   Last visit, saw Dr. Wynetta Emery for chest pain.  Went to the ER and MI was ruled out.    Diabetes: Using medications without difficulties.  Taking 40 units of Levimir No hypoglycemic episodes No hyperglycemic episodes Blood Sugars averaging: It has been high  Hypertension  Using medications without difficulty Average home BPs   Using medication without problems or lightheadedness No chest pain with exertion or shortness of breath No Edema  Elevated Cholesterol Using medications without problems No Muscle aches   Cough Having a cough for 4-5 days.  No change.  Taking her inhalers.  Taking Advair and Spireva daily.  Last chest x-ray 10/29 without cardiac enlargment.  No ankle swelling              Relevant past medical, surgical, family and social history reviewed and updated as indicated. Interim medical history since our last visit reviewed. Allergies and medications reviewed and updated.  Review of Systems  Per HPI unless specifically indicated above     Objective:    BP 106/73 (BP Location: Left Arm, Cuff Size: Normal)   Pulse (!) 102   Temp 97.9 F (36.6 C) (Oral)   Wt 135 lb 3.2 oz (61.3 kg)   LMP  (LMP Unknown)   SpO2 92%   BMI 28.26 kg/m   Wt Readings from Last 3 Encounters:  06/09/17 135 lb 3.2 oz (61.3 kg)  06/01/17 139 lb (63 kg)  06/01/17 139 lb 8 oz (63.3 kg)    Physical Exam  Constitutional: She is oriented to person, place, and time. She appears well-developed and well-nourished.  HENT:  Head: Normocephalic and atraumatic.  Eyes: Pupils are equal,  round, and reactive to light. Right eye exhibits no discharge. Left eye exhibits no discharge. No scleral icterus.  Neck: Normal range of motion. Neck supple. Carotid bruit is not present. No thyromegaly present.  Cardiovascular: Normal rate, regular rhythm and normal heart sounds. Exam reveals no gallop and no friction rub.  No murmur heard. Pulmonary/Chest: Effort normal. No respiratory distress. She has no wheezes. She has rales.  Abdominal: Soft. Bowel sounds are normal. There is no tenderness. There is no rebound.  Genitourinary: No breast swelling, tenderness or discharge.  Musculoskeletal: Normal range of motion.  Lymphadenopathy:    She has no cervical adenopathy.  Neurological: She is alert and oriented to person, place, and time.  Skin: Skin is warm, dry and intact. No rash noted.  Psychiatric: She has a normal mood and affect. Her speech is normal and behavior is normal. Judgment and thought content normal. Cognition and memory are normal.    Results for orders placed or performed during the hospital encounter of 67/12/45  Basic metabolic panel  Result Value Ref Range   Sodium 135 135 - 145 mmol/L   Potassium 4.2 3.5 - 5.1 mmol/L   Chloride 90 (L) 101 - 111 mmol/L   CO2 32 22 - 32 mmol/L   Glucose,  Bld 350 (H) 65 - 99 mg/dL   BUN 8 6 - 20 mg/dL   Creatinine, Ser 0.85 0.44 - 1.00 mg/dL   Calcium 9.1 8.9 - 10.3 mg/dL   GFR calc non Af Amer >60 >60 mL/min   GFR calc Af Amer >60 >60 mL/min   Anion gap 13 5 - 15  CBC  Result Value Ref Range   WBC 12.6 (H) 3.6 - 11.0 K/uL   RBC 4.54 3.80 - 5.20 MIL/uL   Hemoglobin 13.1 12.0 - 16.0 g/dL   HCT 40.6 35.0 - 47.0 %   MCV 89.3 80.0 - 100.0 fL   MCH 28.9 26.0 - 34.0 pg   MCHC 32.4 32.0 - 36.0 g/dL   RDW 14.5 11.5 - 14.5 %   Platelets 370 150 - 440 K/uL  Troponin I  Result Value Ref Range   Troponin I <0.03 <0.03 ng/mL      Assessment & Plan:   Problem List Items Addressed This Visit      Unprioritized   Benign  hypertension with chronic kidney disease - Primary   Relevant Medications   metoprolol succinate (TOPROL-XL) 25 MG 24 hr tablet   Other Relevant Orders   Comprehensive metabolic panel   COPD, moderate (HCC)   Diabetes mellitus with chronic kidney disease (HCC)    Hgb A1C is 9.2.  Not always checking blood sugar.  Titrate up insulin 2 units until FBS is less than 130.  Written instructions given.        Relevant Orders   Bayer DCA Hb A1c Waived    Other Visit Diagnoses    Acute exacerbation of chronic bronchitis (HCC)       Will rx Doxycycline and prednisone 40 mg for 5 days.  Use rescue inhaler at least QID       Follow up plan: Return in about 2 weeks (around 06/23/2017).

## 2017-06-09 NOTE — Assessment & Plan Note (Addendum)
Hgb A1C is 9.2.  Not always checking blood sugar.  Titrate up insulin 2 units until FBS is less than 130.  Written instructions given.

## 2017-06-10 LAB — COMPREHENSIVE METABOLIC PANEL WITH GFR
ALT: 9 IU/L (ref 0–32)
AST: 14 IU/L (ref 0–40)
BUN: 8 mg/dL (ref 8–27)
Bilirubin Total: 0.3 mg/dL (ref 0.0–1.2)
Calcium: 9.6 mg/dL (ref 8.7–10.3)
Chloride: 95 mmol/L — ABNORMAL LOW (ref 96–106)
Glucose: 281 mg/dL — ABNORMAL HIGH (ref 65–99)
Total Protein: 7.8 g/dL (ref 6.0–8.5)

## 2017-06-10 LAB — COMPREHENSIVE METABOLIC PANEL
Albumin/Globulin Ratio: 1.1 — ABNORMAL LOW (ref 1.2–2.2)
Albumin: 4.1 g/dL (ref 3.6–4.8)
Alkaline Phosphatase: 85 IU/L (ref 39–117)
BUN/Creatinine Ratio: 10 — ABNORMAL LOW (ref 12–28)
CO2: 27 mmol/L (ref 20–29)
Creatinine, Ser: 0.84 mg/dL (ref 0.57–1.00)
GFR calc Af Amer: 83 mL/min/{1.73_m2} (ref >59)
GFR calc non Af Amer: 72 mL/min/{1.73_m2} (ref >59)
Globulin, Total: 3.7 g/dL (ref 1.5–4.5)
Potassium: 4.3 mmol/L (ref 3.5–5.2)
Sodium: 139 mmol/L (ref 134–144)

## 2017-06-12 ENCOUNTER — Ambulatory Visit: Payer: Self-pay

## 2017-06-12 ENCOUNTER — Other Ambulatory Visit: Payer: Self-pay | Admitting: Unknown Physician Specialty

## 2017-06-12 MED ORDER — AMOXICILLIN-POT CLAVULANATE 875-125 MG PO TABS: 1.0000 | ORAL_TABLET | Freq: Two times a day (BID) | ORAL | 0 refills | Status: DC

## 2017-06-12 NOTE — Progress Notes (Signed)
Patient notified about medication change  

## 2017-06-12 NOTE — Telephone Encounter (Signed)
   Reason for Disposition . MILD-MODERATE diarrhea (e.g., 1-6 times / day more than normal)  Answer Assessment - Initial Assessment Questions 1. DIARRHEA SEVERITY: "How bad is the diarrhea?" "How many extra stools have you had in the past 24 hours than normal?"    - MILD: Few loose or mushy BMs; increase of 1-3 stools over normal daily number of stools; mild increase in ostomy output.   - MODERATE: Increase of 4-6 stools daily over normal; moderate increase in ostomy output.   - SEVERE (or Worst Possible): Increase of 7 or more stools daily over normal; moderate increase in ostomy output; incontinence.     Moderate 2. ONSET: "When did the diarrhea begin?"      When she started Doxycyline 3. BM CONSISTENCY: "How loose or watery is the diarrhea?"      Watery 4. VOMITING: "Are you also vomiting?" If so, ask: "How many times in the past 24 hours?"      Vomited x 1 5. ABDOMINAL PAIN: "Are you having any abdominal pain?" If yes: "What does it feel like?" (e.g., crampy, dull, intermittent, constant)      No 6. ABDOMINAL PAIN SEVERITY: If present, ask: "How bad is the pain?"  (e.g., Scale 1-10; mild, moderate, or severe)    - MILD (1-3): doesn't interfere with normal activities, abdomen soft and not tender to touch     - MODERATE (4-7): interferes with normal activities or awakens from sleep, tender to touch     - SEVERE (8-10): excruciating pain, doubled over, unable to do any normal activities       None 7. ORAL INTAKE: If vomiting, "Have you been able to drink liquids?" "How much fluids have you had in the past 24 hours?"     Drinking without difficulty 8. HYDRATION: "Any signs of dehydration?" (e.g., dry mouth [not just dry lips], too weak to stand, dizziness, new weight loss) "When did you last urinate?"     No signs of dehydration 9. EXPOSURE: "Have you traveled to a foreign country recently?" "Have you been exposed to anyone with diarrhea?" "Could you have eaten any food that was  spoiled?"     No 10. OTHER SYMPTOMS: "Do you have any other symptoms?" (e.g., fever, blood in stool)       No 11. PREGNANCY: "Is there any chance you are pregnant?" "When was your last menstrual period?"        No  Protocols used: DIARRHEA-A-AH

## 2017-06-12 NOTE — Telephone Encounter (Signed)
Routing to provider  

## 2017-06-15 ENCOUNTER — Other Ambulatory Visit: Payer: Self-pay | Admitting: Unknown Physician Specialty

## 2017-06-16 NOTE — Telephone Encounter (Signed)
Medication refill. Thanks. 

## 2017-06-16 NOTE — Telephone Encounter (Signed)
Please advise 

## 2017-06-24 ENCOUNTER — Ambulatory Visit: Payer: Medicare HMO | Admitting: Unknown Physician Specialty

## 2017-06-24 ENCOUNTER — Encounter: Payer: Self-pay | Admitting: Unknown Physician Specialty

## 2017-06-24 ENCOUNTER — Other Ambulatory Visit: Payer: Self-pay | Admitting: Family Medicine

## 2017-06-24 VITALS — BP 102/65 | HR 105 | Temp 97.8°F | Wt 137.4 lb

## 2017-06-24 DIAGNOSIS — E1122 Type 2 diabetes mellitus with diabetic chronic kidney disease: Secondary | ICD-10-CM | POA: Diagnosis not present

## 2017-06-24 DIAGNOSIS — E1165 Type 2 diabetes mellitus with hyperglycemia: Secondary | ICD-10-CM | POA: Diagnosis not present

## 2017-06-24 DIAGNOSIS — IMO0002 Reserved for concepts with insufficient information to code with codable children: Secondary | ICD-10-CM

## 2017-06-24 DIAGNOSIS — F419 Anxiety disorder, unspecified: Secondary | ICD-10-CM

## 2017-06-24 DIAGNOSIS — R630 Anorexia: Secondary | ICD-10-CM

## 2017-06-24 DIAGNOSIS — E1140 Type 2 diabetes mellitus with diabetic neuropathy, unspecified: Secondary | ICD-10-CM | POA: Insufficient documentation

## 2017-06-24 LAB — GLUCOSE HEMOCUE WAIVED: Glu Hemocue Waived: 190 mg/dL — ABNORMAL HIGH (ref 65–99)

## 2017-06-24 NOTE — Assessment & Plan Note (Signed)
This also is gradually improving.

## 2017-06-24 NOTE — Progress Notes (Signed)
BP 102/65   Pulse (!) 105   Temp 97.8 F (36.6 C) (Oral)   Wt 137 lb 6.4 oz (62.3 kg)   LMP  (LMP Unknown)   SpO2 92%   BMI 28.72 kg/m    Subjective:    [name], female    DOB: 1951/01/17, 66 y.o.   MRN: [id number]  HPI: Jordan Chan is a 66 y.o. female  Chief Complaint  Patient presents with  . Bronchitis    2 week f/up  . Diabetes    2 week f/up    Chronic Bronchitis Pt is here for f/u of chronic bronchitis as she had a flare 2 weeks ago and taking Proventil, Spireva, and Advair.  No SOB or cough.  Pulse ox is 92%.  Using O2 at night as could not stand CPAP  Diabetes  Pt is taking 42 units of insulin.  States her BS is typically 160-180 but this is better than the 200's she was getting.  She was instructed to titrate up insulin but concerned BS goes too low if she goes above 42 units.  States the "too low" is around 44 and feels jittery and shaky.  Poor appetite/reflux Primary concern today is she lacks an appetite and when she does eat, she gets nauseated.  States this is ongoing since out of the hospital.  No significant weight loss though weighed in the 150s before she went to the hospital.  Zofran is helping nausea but not much.  Significant belching after eating.   Pantoprazole is a new medication prescribed at another practiceand not helping.    Relevant past medical, surgical, family and social history reviewed and updated as indicated. Interim medical history since our last visit reviewed. Allergies and medications reviewed and updated.  Review of Systems  Constitutional: Positive for appetite change and fatigue. Negative for activity change, fever and unexpected weight change.  HENT: Negative.   Respiratory: Negative.   Cardiovascular: Negative.     Per HPI unless specifically indicated above     Objective:    BP 102/65   Pulse (!) 105   Temp 97.8 F (36.6 C) (Oral)   Wt 137 lb 6.4 oz (62.3 kg)   LMP  (LMP Unknown)   SpO2  92%   BMI 28.72 kg/m   Wt Readings from Last 3 Encounters:  06/24/17 137 lb 6.4 oz (62.3 kg)  06/09/17 135 lb 3.2 oz (61.3 kg)  06/01/17 139 lb (63 kg)    Physical Exam  Constitutional: She is oriented to person, place, and time. She appears well-developed and well-nourished. No distress.  HENT:  Head: Normocephalic and atraumatic.  Eyes: Conjunctivae and lids are normal. Right eye exhibits no discharge. Left eye exhibits no discharge. No scleral icterus.  Neck: Normal range of motion. Neck supple. No JVD present. Carotid bruit is not present.  Cardiovascular: Normal rate, regular rhythm and normal heart sounds.  Pulmonary/Chest: Effort normal and breath sounds normal.  Abdominal: Normal appearance. There is no splenomegaly or hepatomegaly.  Musculoskeletal: Normal range of motion.  Neurological: She is alert and oriented to person, place, and time.  Skin: Skin is warm, dry and intact. No rash noted. No pallor.  Psychiatric: She has a normal mood and affect. Her behavior is normal. Judgment and thought content normal.    Results for orders placed or performed in visit on 06/09/17  Comprehensive metabolic panel  Result Value Ref Range   Glucose 281 (H) 65 - 99 mg/dL  BUN 8 8 - 27 mg/dL   Creatinine, Ser 0.84 0.57 - 1.00 mg/dL   GFR calc non Af Amer 72 >59 mL/min/1.73   GFR calc Af Amer 83 >59 mL/min/1.73   BUN/Creatinine Ratio 10 (L) 12 - 28   Sodium 139 134 - 144 mmol/L   Potassium 4.3 3.5 - 5.2 mmol/L   Chloride 95 (L) 96 - 106 mmol/L   CO2 27 20 - 29 mmol/L   Calcium 9.6 8.7 - 10.3 mg/dL   Total Protein 7.8 6.0 - 8.5 g/dL   Albumin 4.1 3.6 - 4.8 g/dL   Globulin, Total 3.7 1.5 - 4.5 g/dL   Albumin/Globulin Ratio 1.1 (L) 1.2 - 2.2   Bilirubin Total 0.3 0.0 - 1.2 mg/dL   Alkaline Phosphatase 85 39 - 117 IU/L   AST 14 0 - 40 IU/L   ALT 9 0 - 32 IU/L  Bayer DCA Hb A1c Waived  Result Value Ref Range   Bayer DCA Hb A1c Waived 9.2 (H) <7.0 %      Assessment & Plan:    Problem List Items Addressed This Visit      Unprioritized   Anxiety    This also is gradually improving.        Poor appetite    Suspect related to gastroperesis secondary to chronic opiate use and diabetes.  Encouraged small frequent meals, decrease fat, and consider using Reglan.  Consider stopping Metformin      Uncontrolled type 2 diabetes mellitus with chronic kidney disease ([place]) - Primary    BS still high, but glucose this AM was 190 compared to 280 last visit.  Will continue with present doses as gradual improvement.  Also anorexia may contribute to hypoglycemia      Relevant Orders   Glucose Hemocue Waived       Follow up plan: Return in about 4 weeks (around 07/22/2017).

## 2017-06-24 NOTE — Assessment & Plan Note (Signed)
BS still high, but glucose this AM was 190 compared to 280 last visit.  Will continue with present doses as gradual improvement.  Also anorexia may contribute to hypoglycemia

## 2017-06-24 NOTE — Telephone Encounter (Signed)
Your patient 

## 2017-06-24 NOTE — Assessment & Plan Note (Addendum)
Suspect related to gastroperesis secondary to chronic opiate use and diabetes.  Encouraged small frequent meals, decrease fat, and consider using Reglan.  Consider stopping Metformin

## 2017-07-01 ENCOUNTER — Other Ambulatory Visit: Payer: Self-pay | Admitting: Family Medicine

## 2017-07-01 NOTE — Telephone Encounter (Signed)
rx

## 2017-07-05 DIAGNOSIS — J449 Chronic obstructive pulmonary disease, unspecified: Secondary | ICD-10-CM | POA: Diagnosis not present

## 2017-07-20 ENCOUNTER — Other Ambulatory Visit: Payer: Self-pay | Admitting: Family Medicine

## 2017-07-22 ENCOUNTER — Encounter: Payer: Self-pay | Admitting: Unknown Physician Specialty

## 2017-07-22 ENCOUNTER — Ambulatory Visit (INDEPENDENT_AMBULATORY_CARE_PROVIDER_SITE_OTHER): Payer: Medicare HMO | Admitting: Unknown Physician Specialty

## 2017-07-22 VITALS — BP 128/83 | HR 91 | Temp 98.1°F | Wt 139.4 lb

## 2017-07-22 DIAGNOSIS — E538 Deficiency of other specified B group vitamins: Secondary | ICD-10-CM

## 2017-07-22 DIAGNOSIS — R630 Anorexia: Secondary | ICD-10-CM

## 2017-07-22 DIAGNOSIS — E1165 Type 2 diabetes mellitus with hyperglycemia: Secondary | ICD-10-CM | POA: Diagnosis not present

## 2017-07-22 DIAGNOSIS — IMO0002 Reserved for concepts with insufficient information to code with codable children: Secondary | ICD-10-CM

## 2017-07-22 DIAGNOSIS — E1122 Type 2 diabetes mellitus with diabetic chronic kidney disease: Secondary | ICD-10-CM | POA: Diagnosis not present

## 2017-07-22 DIAGNOSIS — F5101 Primary insomnia: Secondary | ICD-10-CM | POA: Diagnosis not present

## 2017-07-22 MED ORDER — CYANOCOBALAMIN 1000 MCG/ML IJ SOLN
1000.0000 ug | Freq: Once | INTRAMUSCULAR | Status: AC
  Administered 2017-07-22: 1000 ug via INTRAMUSCULAR

## 2017-07-22 NOTE — Assessment & Plan Note (Signed)
Seems to be improving.  Recheck in 2 months

## 2017-07-22 NOTE — Progress Notes (Signed)
   BP 128/83   Pulse 91   Temp 98.1 F (36.7 C) (Oral)   Wt 139 lb 6.4 oz (63.2 kg)   LMP  (LMP Unknown)   SpO2 98%   BMI 29.13 kg/m    Subjective:    [name], female    DOB: [birth year], 66 y.o.   MRN: [id number]  HPI: [name] is a 66 y.o. female  Chief Complaint  Patient presents with  . Follow-up    4 week f/up   Anorexia Last visit this was causing a lot of distress.  She is doing better.  This may be due to diabetes improvement. With blood sugar was 120 this AM.    Insomnia This is her biggest problem "tried everything under the sun."  Including Trazadone, Ambien and others.  She is OK managing this on her own  Relevant past medical, surgical, family and social history reviewed and updated as indicated. Interim medical history since our last visit reviewed. Allergies and medications reviewed and updated.  Review of Systems  Per HPI unless specifically indicated above     Objective:    BP 128/83   Pulse 91   Temp 98.1 F (36.7 C) (Oral)   Wt 139 lb 6.4 oz (63.2 kg)   LMP  (LMP Unknown)   SpO2 98%   BMI 29.13 kg/m   Wt Readings from Last 3 Encounters:  07/22/17 139 lb 6.4 oz (63.2 kg)  06/24/17 137 lb 6.4 oz (62.3 kg)  06/09/17 135 lb 3.2 oz (61.3 kg)    Physical Exam  Constitutional: She is oriented to person, place, and time. She appears well-developed and well-nourished. No distress.  HENT:  Head: Normocephalic and atraumatic.  Eyes: Conjunctivae and lids are normal. Right eye exhibits no discharge. Left eye exhibits no discharge. No scleral icterus.  Neck: Normal range of motion. Neck supple. No JVD present. Carotid bruit is not present.  Cardiovascular: Normal rate, regular rhythm and normal heart sounds.  Pulmonary/Chest: Effort normal and breath sounds normal.  Abdominal: Normal appearance. There is no splenomegaly or hepatomegaly.  Musculoskeletal: Normal range of motion.  Neurological: She is alert and oriented  to person, place, and time.  Skin: Skin is warm, dry and intact. No rash noted. No pallor.  Psychiatric: She has a normal mood and affect. Her behavior is normal. Judgment and thought content normal.    Results for orders placed or performed in visit on 06/24/17  Glucose Hemocue Waived  Result Value Ref Range   Glu Hemocue Waived 190 (H) 65 - 99 mg/dL      Assessment & Plan:   Problem List Items Addressed This Visit      Unprioritized   B12 deficiency - Primary   Relevant Medications   cyanocobalamin ((VITAMIN B-12)) injection 1,000 mcg (Completed)   Insomnia    Discussed sleep hygeine.  She watches TV much of the night.  Discussed Belsomra but likely unaffordable and refusing      Uncontrolled type 2 diabetes mellitus with chronic kidney disease ([place])    Seems to be improving.  Recheck in 2 months       Other Visit Diagnoses    Anorexia       This is improved.  No changes necessary       Follow up plan: Return in about 2 months (around 09/22/2017).

## 2017-07-22 NOTE — Assessment & Plan Note (Signed)
Discussed sleep hygeine.  She watches TV much of the night.  Discussed Belsomra but likely unaffordable and refusing

## 2017-07-23 ENCOUNTER — Ambulatory Visit: Payer: Medicare HMO | Admitting: Internal Medicine

## 2017-07-23 ENCOUNTER — Encounter: Payer: Self-pay | Admitting: Internal Medicine

## 2017-07-23 VITALS — BP 108/60 | HR 90 | Ht 58.5 in | Wt 138.5 lb

## 2017-07-23 DIAGNOSIS — I428 Other cardiomyopathies: Secondary | ICD-10-CM | POA: Insufficient documentation

## 2017-07-23 DIAGNOSIS — I5181 Takotsubo syndrome: Secondary | ICD-10-CM | POA: Diagnosis not present

## 2017-07-23 MED ORDER — FUROSEMIDE 20 MG PO TABS: 20.0000 mg | ORAL_TABLET | Freq: Every day | ORAL | 3 refills | Status: DC | PRN

## 2017-07-23 NOTE — Patient Instructions (Addendum)
Medication Instructions:  Your physician has recommended you make the following change in your medication:  1- TAKE Furosemide 1 tablet (20 mg) by mouth daily AS NEEDED for weight gain or swelling.    Labwork: NONE  Testing/Procedures: Your physician has requested that you have an LIMITED echocardiogram. Echocardiography is a painless test that uses sound waves to create images of your heart. It provides your doctor with information about the size and shape of your heart and how well your heart's chambers and valves are working. This procedure takes approximately one hour. There are no restrictions for this procedure.    Follow-Up: Your physician wants you to follow-up in: 6 MONTHS WITH DR END. You will receive a reminder letter in the mail two months in advance. If you don't receive a letter, please call our office to schedule the follow-up appointment.   If you need a refill on your cardiac medications before your next appointment, please call your pharmacy.   Echocardiogram An echocardiogram, or echocardiography, uses sound waves (ultrasound) to produce an image of your heart. The echocardiogram is simple, painless, obtained within a short period of time, and offers valuable information to your health care provider. The images from an echocardiogram can provide information such as:  Evidence of coronary artery disease (CAD).  Heart size.  Heart muscle function.  Heart valve function.  Aneurysm detection.  Evidence of a past heart attack.  Fluid buildup around the heart.  Heart muscle thickening.  Assess heart valve function.  Tell a health care provider about:  Any allergies you have.  All medicines you are taking, including vitamins, herbs, eye drops, creams, and over-the-counter medicines.  Any problems you or family members have had with anesthetic medicines.  Any blood disorders you have.  Any surgeries you have had.  Any medical conditions you  have.  Whether you are pregnant or may be pregnant. What happens before the procedure? No special preparation is needed. Eat and drink normally. What happens during the procedure?  In order to produce an image of your heart, gel will be applied to your chest and a wand-like tool (transducer) will be moved over your chest. The gel will help transmit the sound waves from the transducer. The sound waves will harmlessly bounce off your heart to allow the heart images to be captured in real-time motion. These images will then be recorded.  You may need an IV to receive a medicine that improves the quality of the pictures. What happens after the procedure? You may return to your normal schedule including diet, activities, and medicines, unless your health care provider tells you otherwise. This information is not intended to replace advice given to you by your health care provider. Make sure you discuss any questions you have with your health care provider. Document Released: 07/18/2000 Document Revised: 03/08/2016 Document Reviewed: 03/28/2013 Elsevier Interactive Patient Education  2017 [employer].
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
## 2017-08-05 DIAGNOSIS — J449 Chronic obstructive pulmonary disease, unspecified: Secondary | ICD-10-CM | POA: Diagnosis not present

## 2017-08-13 ENCOUNTER — Ambulatory Visit (INDEPENDENT_AMBULATORY_CARE_PROVIDER_SITE_OTHER): Payer: Medicare HMO

## 2017-08-13 ENCOUNTER — Other Ambulatory Visit: Payer: Self-pay

## 2017-08-13 DIAGNOSIS — I5181 Takotsubo syndrome: Secondary | ICD-10-CM

## 2017-08-18 ENCOUNTER — Other Ambulatory Visit: Payer: Self-pay | Admitting: Unknown Physician Specialty

## 2017-08-19 NOTE — Telephone Encounter (Signed)
Medication refill. Last office visit 07/22/17.Thanks.

## 2017-08-24 DIAGNOSIS — M545 Low back pain: Secondary | ICD-10-CM | POA: Diagnosis not present

## 2017-08-24 DIAGNOSIS — G8929 Other chronic pain: Secondary | ICD-10-CM | POA: Diagnosis not present

## 2017-08-24 DIAGNOSIS — Z9689 Presence of other specified functional implants: Secondary | ICD-10-CM | POA: Diagnosis not present

## 2017-08-24 DIAGNOSIS — G894 Chronic pain syndrome: Secondary | ICD-10-CM | POA: Diagnosis not present

## 2017-09-01 DIAGNOSIS — E119 Type 2 diabetes mellitus without complications: Secondary | ICD-10-CM | POA: Diagnosis not present

## 2017-09-01 LAB — HM DIABETES EYE EXAM

## 2017-09-02 ENCOUNTER — Telehealth: Payer: Self-pay | Admitting: Unknown Physician Specialty

## 2017-09-02 MED ORDER — GABAPENTIN 800 MG PO TABS: 800.0000 mg | ORAL_TABLET | Freq: Three times a day (TID) | ORAL | 1 refills | Status: DC

## 2017-09-02 NOTE — Telephone Encounter (Signed)
Requesting refill of Gabapentin  LOV 07/22/17 with C. Julian Hy  [facility] 01/12/17  #[id number]  1 refill   [facility] - [address], [address] [place] [address] Phone: [phone] Fax: [phone]

## 2017-09-02 NOTE — Telephone Encounter (Signed)
Copied from [place]. Topic: Quick Communication - Rx Refill/Question >> Sep 02, 2017 12:12 PM Cleaster Corin, NT wrote: Medication: gabapentin 800mg    Has the patient contacted their pharmacy? no   (Agent: If no, request that the patient contact the pharmacy for the refill.)   Preferred Pharmacy (with phone number or street name): [facility], [address] [place] [address] Phone: [phone] Fax: [phone]     Agent: Please be advised that RX refills may take up to 3 business days. We ask that you follow-up with your pharmacy.

## 2017-09-03 ENCOUNTER — Other Ambulatory Visit: Payer: Self-pay

## 2017-09-03 ENCOUNTER — Other Ambulatory Visit: Payer: Self-pay | Admitting: Family Medicine

## 2017-09-03 MED ORDER — PANTOPRAZOLE SODIUM 40 MG PO TBEC: 40.0000 mg | DELAYED_RELEASE_TABLET | Freq: Two times a day (BID) | ORAL | 2 refills | Status: DC

## 2017-09-05 DIAGNOSIS — J449 Chronic obstructive pulmonary disease, unspecified: Secondary | ICD-10-CM | POA: Diagnosis not present

## 2017-09-14 DIAGNOSIS — J449 Chronic obstructive pulmonary disease, unspecified: Secondary | ICD-10-CM | POA: Diagnosis not present

## 2017-09-14 DIAGNOSIS — R918 Other nonspecific abnormal finding of lung field: Secondary | ICD-10-CM | POA: Diagnosis not present

## 2017-09-14 DIAGNOSIS — G4734 Idiopathic sleep related nonobstructive alveolar hypoventilation: Secondary | ICD-10-CM | POA: Diagnosis not present

## 2017-09-21 ENCOUNTER — Other Ambulatory Visit: Payer: Self-pay | Admitting: Unknown Physician Specialty

## 2017-09-22 ENCOUNTER — Other Ambulatory Visit: Payer: Self-pay | Admitting: Family Medicine

## 2017-09-22 ENCOUNTER — Ambulatory Visit: Payer: Medicare HMO | Admitting: Unknown Physician Specialty

## 2017-09-24 ENCOUNTER — Encounter: Payer: Self-pay | Admitting: Unknown Physician Specialty

## 2017-09-24 ENCOUNTER — Ambulatory Visit (INDEPENDENT_AMBULATORY_CARE_PROVIDER_SITE_OTHER): Payer: Medicare HMO | Admitting: Unknown Physician Specialty

## 2017-09-24 VITALS — BP 123/76 | HR 91 | Temp 98.4°F | Wt 145.8 lb

## 2017-09-24 DIAGNOSIS — E538 Deficiency of other specified B group vitamins: Secondary | ICD-10-CM

## 2017-09-24 DIAGNOSIS — E782 Mixed hyperlipidemia: Secondary | ICD-10-CM

## 2017-09-24 DIAGNOSIS — F324 Major depressive disorder, single episode, in partial remission: Secondary | ICD-10-CM

## 2017-09-24 DIAGNOSIS — E1165 Type 2 diabetes mellitus with hyperglycemia: Secondary | ICD-10-CM | POA: Diagnosis not present

## 2017-09-24 DIAGNOSIS — E1122 Type 2 diabetes mellitus with diabetic chronic kidney disease: Secondary | ICD-10-CM

## 2017-09-24 DIAGNOSIS — I129 Hypertensive chronic kidney disease with stage 1 through stage 4 chronic kidney disease, or unspecified chronic kidney disease: Secondary | ICD-10-CM

## 2017-09-24 DIAGNOSIS — IMO0002 Reserved for concepts with insufficient information to code with codable children: Secondary | ICD-10-CM

## 2017-09-24 LAB — BAYER DCA HB A1C WAIVED: HB A1C (BAYER DCA - WAIVED): 7.7 % — ABNORMAL HIGH (ref <7.0)

## 2017-09-24 MED ORDER — CYANOCOBALAMIN 1000 MCG/ML IJ SOLN
1000.0000 ug | INTRAMUSCULAR | Status: AC
  Administered 2017-09-24 – 2018-07-15 (×7): 1000 ug via INTRAMUSCULAR

## 2017-09-24 NOTE — Assessment & Plan Note (Signed)
Check lipid panel.  On statin

## 2017-09-24 NOTE — Assessment & Plan Note (Signed)
Hgb A1C is 7.7% OK to titrate up 2 units QHS and monitor for hypoglycemia.  Recheck 3 months

## 2017-09-24 NOTE — Assessment & Plan Note (Signed)
Stable, continue present medications.   

## 2017-09-24 NOTE — Progress Notes (Signed)
BP 123/76   Pulse 91   Temp 98.4 F (36.9 C) (Oral)   Wt 145 lb 12.8 oz (66.1 kg)   LMP  (LMP Unknown)   SpO2 95%   BMI 29.95 kg/m    Subjective:    [name], female    DOB: Oct 04, 1950, 67 y.o.   MRN: [id number]  HPI: Jordan Chan is a 67 y.o. female  Chief Complaint  Patient presents with  . Anxiety  . Diabetes  . Hyperlipidemia  . Hypertension   Diabetes: Using medications without difficulties.  Taking Levimir 42 units/day No hypoglycemic episodes No hyperglycemic episodes Blood Sugars averaging: "better"  138 this AM eye exam within last year Last Hgb A1C: 9.2  Hypertension  Using medications without difficulty Average home BPs: About what they are here   Using medication without problems or lightheadedness No chest pain with exertion or shortness of breath No Edema  Elevated Cholesterol Using medications without problems No Muscle aches  Diet: Exercise: Diet is good.  Basically sedentary due to back pain.  Her morphine was recently increased.    Depression Feels her mood is doing well Depression screen [facility] 2/9 07/22/2017 06/09/2017 03/17/2017 02/16/2017 11/14/2016  Decreased Interest 1 2 1 1  0  Down, Depressed, Hopeless 1 1 2 1 1   PHQ - 2 Score 2 3 3 2 1   Altered sleeping 3 2 3 3 3   Tired, decreased energy 3 2 3 3 3   Change in appetite 0 2 0 0 2  Feeling bad or failure about yourself  0 0 0 0 0  Trouble concentrating 1 1 1 2 2   Moving slowly or fidgety/restless 0 0 0 0 0  Suicidal thoughts 0 0 0 0 0  PHQ-9 Score 9 10 10 10 11       Relevant past medical, surgical, family and social history reviewed and updated as indicated. Interim medical history since our last visit reviewed. Allergies and medications reviewed and updated.  Review of Systems  Respiratory: Negative.   Cardiovascular: Negative.     Per HPI unless specifically indicated above     Objective:    BP 123/76   Pulse 91   Temp 98.4 F (36.9 C) (Oral)    Wt 145 lb 12.8 oz (66.1 kg)   LMP  (LMP Unknown)   SpO2 95%   BMI 29.95 kg/m   Wt Readings from Last 3 Encounters:  09/24/17 145 lb 12.8 oz (66.1 kg)  07/23/17 138 lb 8 oz (62.8 kg)  07/22/17 139 lb 6.4 oz (63.2 kg)    Physical Exam  Constitutional: She is oriented to person, place, and time. She appears well-developed and well-nourished. No distress.  HENT:  Head: Normocephalic and atraumatic.  Eyes: Conjunctivae and lids are normal. Right eye exhibits no discharge. Left eye exhibits no discharge. No scleral icterus.  Neck: Normal range of motion. Neck supple. No JVD present. Carotid bruit is not present.  Cardiovascular: Normal rate, regular rhythm and normal heart sounds.  Pulmonary/Chest: Effort normal and breath sounds normal.  Abdominal: Normal appearance. There is no splenomegaly or hepatomegaly.  Musculoskeletal: Normal range of motion.  Neurological: She is alert and oriented to person, place, and time.  Skin: Skin is warm, dry and intact. No rash noted. No pallor.  Psychiatric: She has a normal mood and affect. Her behavior is normal. Judgment and thought content normal.      Assessment & Plan:   Problem List Items Addressed This Visit  Unprioritized   B12 deficiency - Primary   Relevant Medications   cyanocobalamin ((VITAMIN B-12)) injection 1,000 mcg   Benign hypertension with chronic kidney disease    Stable, continue present medications.        Depression, major, single episode, in partial remission (HCC)    Stable, continue present medications.        Hyperlipidemia    Check lipid panel.  On statin      Relevant Orders   Lipid Panel w/o Chol/HDL Ratio   Uncontrolled type 2 diabetes mellitus with chronic kidney disease (HCC)    Hgb A1C is 7.7% OK to titrate up 2 units QHS and monitor for hypoglycemia.  Recheck 3 months      Relevant Orders   Comprehensive metabolic panel   Bayer DCA Hb A1c Waived       Follow up plan: Return in about 3  months (around 12/22/2017).

## 2017-09-25 ENCOUNTER — Encounter: Payer: Self-pay | Admitting: Unknown Physician Specialty

## 2017-09-25 LAB — LIPID PANEL W/O CHOL/HDL RATIO
Cholesterol, Total: 122 mg/dL (ref 100–199)
HDL: 76 mg/dL (ref >39)
LDL Calculated: 31 mg/dL (ref 0–99)
Triglycerides: 77 mg/dL (ref 0–149)
VLDL Cholesterol Cal: 15 mg/dL (ref 5–40)

## 2017-09-25 LAB — COMPREHENSIVE METABOLIC PANEL
AST: 11 IU/L (ref 0–40)
Albumin/Globulin Ratio: 1.1 — ABNORMAL LOW (ref 1.2–2.2)
Albumin: 3.9 g/dL (ref 3.6–4.8)
Alkaline Phosphatase: 84 IU/L (ref 39–117)
BUN/Creatinine Ratio: 14 (ref 12–28)
Bilirubin Total: 0.2 mg/dL (ref 0.0–1.2)
Chloride: 100 mmol/L (ref 96–106)
Globulin, Total: 3.4 g/dL (ref 1.5–4.5)
Potassium: 4.7 mmol/L (ref 3.5–5.2)
Sodium: 140 mmol/L (ref 134–144)
Total Protein: 7.3 g/dL (ref 6.0–8.5)

## 2017-09-25 LAB — COMPREHENSIVE METABOLIC PANEL WITH GFR
ALT: 10 IU/L (ref 0–32)
BUN: 10 mg/dL (ref 8–27)
CO2: 26 mmol/L (ref 20–29)
Calcium: 9.2 mg/dL (ref 8.7–10.3)
Creatinine, Ser: 0.74 mg/dL (ref 0.57–1.00)
GFR calc Af Amer: 97 mL/min/{1.73_m2} (ref >59)
GFR calc non Af Amer: 84 mL/min/{1.73_m2} (ref >59)
Glucose: 148 mg/dL — ABNORMAL HIGH (ref 65–99)

## 2017-10-02 ENCOUNTER — Other Ambulatory Visit: Payer: Self-pay

## 2017-10-02 MED ORDER — METOPROLOL SUCCINATE ER 25 MG PO TB24: 12.5000 mg | ORAL_TABLET | Freq: Every day | ORAL | 3 refills | Status: DC

## 2017-10-02 MED ORDER — PANTOPRAZOLE SODIUM 40 MG PO TBEC: 40.0000 mg | DELAYED_RELEASE_TABLET | Freq: Two times a day (BID) | ORAL | 2 refills | Status: DC

## 2017-10-02 NOTE — Telephone Encounter (Signed)
Patient last seen 09/24/17 and has f/up scheduled for 12/23/17. Please make sure prescriptions say normal so they will be sent electronically.

## 2017-10-03 DIAGNOSIS — J449 Chronic obstructive pulmonary disease, unspecified: Secondary | ICD-10-CM | POA: Diagnosis not present

## 2017-10-17 ENCOUNTER — Other Ambulatory Visit: Payer: Self-pay | Admitting: Family Medicine

## 2017-10-19 ENCOUNTER — Other Ambulatory Visit: Payer: Self-pay | Admitting: Unknown Physician Specialty

## 2017-11-03 DIAGNOSIS — J449 Chronic obstructive pulmonary disease, unspecified: Secondary | ICD-10-CM | POA: Diagnosis not present

## 2017-11-09 DIAGNOSIS — M542 Cervicalgia: Secondary | ICD-10-CM | POA: Diagnosis not present

## 2017-11-09 DIAGNOSIS — G894 Chronic pain syndrome: Secondary | ICD-10-CM | POA: Diagnosis not present

## 2017-11-09 DIAGNOSIS — M545 Low back pain: Secondary | ICD-10-CM | POA: Diagnosis not present

## 2017-11-09 DIAGNOSIS — Z9689 Presence of other specified functional implants: Secondary | ICD-10-CM | POA: Diagnosis not present

## 2017-11-16 ENCOUNTER — Ambulatory Visit: Payer: Medicare HMO

## 2017-11-16 ENCOUNTER — Other Ambulatory Visit: Payer: Self-pay | Admitting: Family Medicine

## 2017-11-20 ENCOUNTER — Ambulatory Visit (INDEPENDENT_AMBULATORY_CARE_PROVIDER_SITE_OTHER): Payer: Medicare HMO

## 2017-11-20 DIAGNOSIS — E538 Deficiency of other specified B group vitamins: Secondary | ICD-10-CM | POA: Diagnosis not present

## 2017-12-03 DIAGNOSIS — J449 Chronic obstructive pulmonary disease, unspecified: Secondary | ICD-10-CM | POA: Diagnosis not present

## 2017-12-04 ENCOUNTER — Ambulatory Visit (INDEPENDENT_AMBULATORY_CARE_PROVIDER_SITE_OTHER): Payer: Medicare HMO

## 2017-12-04 VITALS — BP 102/60 | HR 79 | Temp 98.5°F | Resp 16 | Ht <= 58 in | Wt 148.8 lb

## 2017-12-04 DIAGNOSIS — Z0000 Encounter for general adult medical examination without abnormal findings: Secondary | ICD-10-CM | POA: Diagnosis not present

## 2017-12-04 NOTE — Progress Notes (Signed)
Subjective:   Jordan Chan is a 67 y.o. female who presents for Medicare Annual (Subsequent) preventive examination.  Review of Systems:   Cardiac Risk Factors include: advanced age (>68men, >81 women);hypertension;dyslipidemia;obesity (BMI >30kg/m2);diabetes mellitus     Objective:     Vitals: BP 102/60 (BP Location: Left Arm, Patient Position: Sitting)   Pulse 79   Temp 98.5 F (36.9 C) (Temporal)   Resp 16   Ht 4\' 10"  (1.473 m)   Wt 148 lb 12.8 oz (67.5 kg)   LMP  (LMP Unknown)   BMI 31.10 kg/m   Body mass index is 31.1 kg/m.  Advanced Directives 12/04/2017 06/01/2017 04/28/2017 04/25/2017 04/25/2017 03/02/2017 01/01/2017  Does Patient Have a Medical Advance Directive? Yes Yes - Yes No;Yes No Yes  Type of Paramedic of [place];Living will - Living will Living will [place];Living will - [place];Living will  Does patient want to make changes to medical advance directive? - - No - Patient declined No - Patient declined - - -  Copy of [place] in Chart? No - copy requested - - - - - No - copy requested  Would patient like information on creating a medical advance directive? - - - - - No - Patient declined -    Tobacco Social History   Tobacco Use  Smoking Status Former Smoker  . Packs/day: 1.00  . Years: 30.00  . Pack years: 30.00  . Types: Cigarettes  . Last attempt to quit: 04/30/2004  . Years since quitting: 13.6  Smokeless Tobacco Never Used     Counseling given: Not Answered   Clinical Intake:  Pre-visit preparation completed: Yes  Pain : 0-10 Pain Score: 6  Pain Type: Chronic pain Pain Location: Back Pain Orientation: Lower Pain Descriptors / Indicators: Aching Pain Onset: More than a month ago Pain Frequency: Constant     Nutritional Status: BMI > 30  Obese Nutritional Risks: None Diabetes: Yes CBG done?: No Did pt. bring in CBG monitor from home?: No  How  often do you need to have someone help you when you read instructions, pamphlets, or other written materials from your doctor or pharmacy?: 1 - Never What is the last grade level you completed in school?: 12th grade  Interpreter Needed?: No  Information entered by :: Tiffany Hill,LPN   Past Medical History:  Diagnosis Date  . Arthritis   . Asthma   . Breast cancer ([place]) 1998   right breast ca with mastectomy and chemotherapy and radiation  . COPD (chronic obstructive pulmonary disease) ([place])   . Diabetes mellitus without complication ([place])   . Endometriosis   . GERD (gastroesophageal reflux disease)   . IBS (irritable bowel syndrome)   . Low back pain    a. Implanted morphine/bupivicaine/clonidine pump.  . Sleep apnea    Past Surgical History:  Procedure Laterality Date  . ABDOMINAL HYSTERECTOMY  1987  . BACK SURGERY     Tailbone removed following fracture  . CARDIAC CATHETERIZATION    . COLONOSCOPY WITH PROPOFOL N/A 01/01/2017   Procedure: COLONOSCOPY WITH PROPOFOL;  Surgeon: Jonathon Bellows, MD;  Location: [facility] ENDOSCOPY;  Service: Endoscopy;  Laterality: N/A;  . ELBOW ARTHROSCOPY WITH TENDON RECONSTRUCTION    . ESOPHAGOGASTRODUODENOSCOPY (EGD) WITH PROPOFOL N/A 01/01/2017   Procedure: ESOPHAGOGASTRODUODENOSCOPY (EGD) WITH PROPOFOL;  Surgeon: Jonathon Bellows, MD;  Location: [facility] ENDOSCOPY;  Service: Endoscopy;  Laterality: N/A;  . LEFT HEART CATH AND CORONARY ANGIOGRAPHY N/A  04/28/2017   Procedure: LEFT HEART CATH AND CORONARY ANGIOGRAPHY;  Surgeon: [name], MD;  Location: [place] CV LAB;  Service: Cardiovascular;  Laterality: N/A;  . MASTECTOMY  06/1997  . morphine pump  2011  . PLANTAR FASCIA RELEASE     Family History  Problem Relation Age of Onset  . Cancer Mother 62       lung  . Thyroid disease Mother   . Cancer Father 65       Pancreatic  . Hypertension Sister   . Cancer Sister        "cancer on face"  . Hyperlipidemia Sister   . Osteoporosis Maternal  Grandmother   . Hyperlipidemia Son   . Seizures Son   . Cancer Paternal Grandmother        colon  . Arthritis Paternal Grandfather   . Breast cancer Neg Hx    Social History   Socioeconomic History  . Marital status: Legally Separated    Spouse name: Not on file  . Number of children: Not on file  . Years of education: Not on file  . Highest education level: Not on file  Occupational History  . Not on file  Social Needs  . Financial resource strain: Not hard at all  . Food insecurity:    Worry: Never true    Inability: Never true  . Transportation needs:    Medical: No    Non-medical: No  Tobacco Use  . Smoking status: Former Smoker    Packs/day: 1.00    Years: 30.00    Pack years: 30.00    Types: Cigarettes    Last attempt to quit: 04/30/2004    Years since quitting: 13.6  . Smokeless tobacco: Never Used  Substance and Sexual Activity  . Alcohol use: No  . Drug use: No  . Sexual activity: Not Currently    Birth control/protection: Post-menopausal  Lifestyle  . Physical activity:    Days per week: 0 days    Minutes per session: 0 min  . Stress: Not at all  Relationships  . Social connections:    Talks on phone: More than three times a week    Gets together: More than three times a week    Attends religious service: More than 4 times per year    Active member of club or organization: No    Attends meetings of clubs or organizations: Never    Relationship status: Separated  Other Topics Concern  . Not on file  Social History Narrative  . Not on file    Outpatient Encounter Medications as of 12/04/2017  Medication Sig  . ACCU-CHEK AVIVA PLUS test strip TEST ONE TIME DAILY  . ACCU-CHEK SOFTCLIX LANCETS lancets CHECK ONCE A DAY  . ADVAIR DISKUS 250-50 MCG/DOSE AEPB INHALE 1 PUFF TWICE DAILY  . albuterol (PROVENTIL HFA;VENTOLIN HFA) 108 (90 BASE) MCG/ACT inhaler Inhale 2 puffs into the lungs every 6 (six) hours as needed for wheezing or shortness of breath.  [name]  aspirin EC 81 MG tablet Take 81 mg by mouth daily.  [name] atorvastatin (LIPITOR) 20 MG tablet TAKE 1 TABLET EVERY DAY  . clonazePAM (KLONOPIN) 0.5 MG tablet Take 1 tablet (0.5 mg total) by mouth 2 (two) times daily as needed for anxiety.  . cyclobenzaprine (FLEXERIL) 5 MG tablet Take 5 mg by mouth 2 (two) times daily as needed.  . diclofenac (VOLTAREN) 50 MG EC tablet TAKE 1 TABLET THREE TIMES DAILY (SUBSTITUTED FOR VOLTAREN)  . gabapentin (  NEURONTIN) 800 MG tablet Take 1 tablet (800 mg total) by mouth 3 (three) times daily.  . Insulin Detemir (LEVEMIR FLEXTOUCH) 100 UNIT/ML Pen Inject 10 Units into the skin daily at 10 pm.  . lisinopril (PRINIVIL,ZESTRIL) 5 MG tablet TAKE 1 TABLET EVERY DAY  . metFORMIN (GLUCOPHAGE) 1000 MG tablet Take 1 tablet (1,000 mg total) by mouth 2 (two) times daily with a meal.  . metoprolol succinate (TOPROL-XL) 25 MG 24 hr tablet Take 0.5 tablets (12.5 mg total) by mouth daily.  . ondansetron (ZOFRAN ODT) 4 MG disintegrating tablet Take 1 tablet (4 mg total) by mouth every 8 (eight) hours as needed for nausea or vomiting.  . pantoprazole (PROTONIX) 40 MG tablet TAKE 1 TABLET BY MOUTH TWICE DAILY  . tiotropium (SPIRIVA HANDIHALER) 18 MCG inhalation capsule Place 18 mcg into inhaler and inhale daily.  [name] venlafaxine XR (EFFEXOR-XR) 150 MG 24 hr capsule TAKE 1 CAPSULE EVERY DAY  . furosemide (LASIX) 20 MG tablet Take 1 tablet (20 mg total) by mouth daily as needed. (Patient not taking: Reported on 12/04/2017)  . [DISCONTINUED] LEVEMIR FLEXTOUCH 100 UNIT/ML Pen INJECT 42 UNITS SUBCUTANEOUSLY AT BEDTIME (Patient not taking: Reported on 12/04/2017)  . [DISCONTINUED] LEVEMIR FLEXTOUCH 100 UNIT/ML Pen INJECT 42 UNITS SUBCUTANEOUSLY AT BEDTIME (Patient not taking: Reported on 12/04/2017)  . [DISCONTINUED] pantoprazole (PROTONIX) 40 MG tablet Take 1 tablet (40 mg total) by mouth 2 (two) times daily. (Patient not taking: Reported on 12/04/2017)   Facility-Administered Encounter Medications as  of 12/04/2017  Medication  . cyanocobalamin ((VITAMIN B-12)) injection 1,000 mcg    Activities of Daily Living In your present state of health, do you have any difficulty performing the following activities: 12/04/2017 04/25/2017  Hearing? N N  Vision? Y N  Comment blurred vision when elevated glucose  -  Difficulty concentrating or making decisions? Y N  Walking or climbing stairs? Y N  Dressing or bathing? N N  Doing errands, shopping? N N  Preparing Food and eating ? N -  Using the Toilet? N -  In the past six months, have you accidently leaked urine? N -  Do you have problems with loss of bowel control? N -  Managing your Medications? N -  Managing your Finances? N -  Housekeeping or managing your Housekeeping? N -  Some recent data might be hidden    Patient Care Team: Kathrine Haddock, NP as PCP - General (Nurse Practitioner) Erby Pian, MD as Referring Physician (Pulmonary Disease) Kandace Blitz, MD as Referring Physician (Pain Medicine)    Assessment:   This is a routine wellness examination for Donnis.  Exercise Activities and Dietary recommendations Current Exercise Habits: The patient does not participate in regular exercise at present, Exercise limited by: orthopedic condition(s)  Goals    . DIET - INCREASE WATER INTAKE     Recommend drinking at least 6-8 glasses of water a day        Fall Risk Fall Risk  12/04/2017 07/22/2017 03/17/2017 02/16/2017 11/14/2016  Falls in the past year? No Yes Yes No No  Number falls in past yr: - - 1 - -  Injury with Fall? - - No - -   Is the patient's home free of loose throw rugs in walkways, pet beds, electrical cords, etc?   yes      Grab bars in the bathroom? yes      Handrails on the stairs?   no stairs       Adequate lighting?  yes  Timed Get Up and Go performed: Completed in 8 seconds with no use of assistive devices, steady gait. No intervention needed at this time.   Depression Screen PHQ 2/9 Scores  12/04/2017 07/22/2017 06/09/2017 03/17/2017  PHQ - 2 Score 1 2 3 3   PHQ- 9 Score 5 9 10 10      Cognitive Function     6CIT Screen 12/04/2017  What Year? 0 points  What month? 0 points  What time? 0 points  Count back from 20 0 points  Months in reverse 0 points  Repeat phrase 0 points  Total Score 0    Immunization History  Administered Date(s) Administered  . Influenza, High Dose Seasonal PF 04/28/2016, 04/13/2017  . Influenza,inj,Quad PF,6+ Mos 06/18/2015  . Influenza-Unspecified 04/04/2014  . Pneumococcal Conjugate-13 06/18/2015  . Pneumococcal Polysaccharide-23 08/06/2005, 08/05/2016  . Td 08/28/2007    Qualifies for Shingles Vaccine? Yes, discussed shingrix vaccine   Screening Tests Health Maintenance  Topic Date Due  . FOOT EXAM  08/05/2017  . TETANUS/TDAP  09/24/2018 (Originally 08/27/2017)  . INFLUENZA VACCINE  03/04/2018  . HEMOGLOBIN A1C  03/24/2018  . OPHTHALMOLOGY EXAM  09/01/2018  . MAMMOGRAM  03/06/2019  . COLONOSCOPY  01/02/2027  . DEXA SCAN  Completed  . Hepatitis C Screening  Completed  . PNA vac Low Risk Adult  Completed    Cancer Screenings: Lung: Low Dose CT Chest recommended if Age 22-80 years, 30 pack-year currently smoking OR have quit w/in 15years. Patient does not qualify. Breast:  Up to date on Mammogram? Yes  03/05/2017 Up to date of Bone Density/Dexa? Yes 02/25/2016 Colorectal: completed 01/01/2017  Additional Screenings:  Hepatitis C Screening: completed 06/18/2015     Plan:  I have personally reviewed and addressed the Medicare Annual Wellness questionnaire and have noted the following in the patient's chart:  A. Medical and social history B. Use of alcohol, tobacco or illicit drugs  C. Current medications and supplements D. Functional ability and status E.  Nutritional status F.  Physical activity G. Advance directives H. List of other physicians I.  Hospitalizations, surgeries, and ER visits in previous 12 months J.   [place] such as hearing and vision if needed, cognitive and depression L. Referrals and appointments   In addition, I have reviewed and discussed with patient certain preventive protocols, quality metrics, and best practice recommendations. A written personalized care plan for preventive services as well as general preventive health recommendations were provided to patient.   Signed,  Tyler Aas, LPN Nurse Health Advisor   Nurse Notes:none

## 2017-12-04 NOTE — Patient Instructions (Addendum)
[name] , Thank you for taking time to come for your Medicare Wellness Visit. I appreciate your ongoing commitment to your health goals. Please review the following plan we discussed and let me know if I can assist you in the future.   Screening recommendations/referrals: Colonoscopy: completed 01/01/2017 Mammogram: completed 03/05/2017 Bone Density: completed 02/25/2016 Recommended yearly ophthalmology/optometry visit for glaucoma screening and checkup Recommended yearly dental visit for hygiene and checkup  Vaccinations: Influenza vaccine: up to date Pneumococcal vaccine: up to date Tdap vaccine: due, check with your insurance company for coverage  Shingles vaccine: eligible, check with your insurance company for coverage     Advanced directives: Please bring a copy of your health care power of attorney and living will to the office at your convenience.  Conditions/risks identified: Recommend drinking at least 6-8 glasses of water a day.   Next appointment: Follow up on 12/23/2017 at 9:30am with Jordan Chan.  Follow up in one year for your annual wellness exam.    Preventive Care 65 Years and Older, Female Preventive care refers to lifestyle choices and visits with your health care provider that can promote health and wellness. What does preventive care include?  A yearly physical exam. This is also called an annual well check.  Dental exams once or twice a year.  Routine eye exams. Ask your health care provider how often you should have your eyes checked.  Personal lifestyle choices, including:  Daily care of your teeth and gums.  Regular physical activity.  Eating a healthy diet.  Avoiding tobacco and drug use.  Limiting alcohol use.  Practicing safe sex.  Taking low-dose aspirin every day.  Taking vitamin and mineral supplements as recommended by your health care provider. What happens during an annual well check? The services and screenings done by your  health care provider during your annual well check will depend on your age, overall health, lifestyle risk factors, and family history of disease. Counseling  Your health care provider may ask you questions about your:  Alcohol use.  Tobacco use.  Drug use.  Emotional well-being.  Home and relationship well-being.  Sexual activity.  Eating habits.  History of falls.  Memory and ability to understand (cognition).  Work and work Statistician.  Reproductive health. Screening  You may have the following tests or measurements:  Height, weight, and BMI.  Blood pressure.  Lipid and cholesterol levels. These may be checked every 5 years, or more frequently if you are over 31 years old.  Skin check.  Lung cancer screening. You may have this screening every year starting at age 70 if you have a 30-pack-year history of smoking and currently smoke or have quit within the past 15 years.  Fecal occult blood test (FOBT) of the stool. You may have this test every year starting at age 71.  Flexible sigmoidoscopy or colonoscopy. You may have a sigmoidoscopy every 5 years or a colonoscopy every 10 years starting at age 26.  Hepatitis C blood test.  Hepatitis B blood test.  Sexually transmitted disease (STD) testing.  Diabetes screening. This is done by checking your blood sugar (glucose) after you have not eaten for a while (fasting). You may have this done every 1-3 years.  Bone density scan. This is done to screen for osteoporosis. You may have this done starting at age 89.  Mammogram. This may be done every 1-2 years. Talk to your health care provider about how often you should have regular mammograms. Talk with your  health care provider about your test results, treatment options, and if necessary, the need for more tests. Vaccines  Your health care provider may recommend certain vaccines, such as:  Influenza vaccine. This is recommended every year.  Tetanus, diphtheria, and  acellular pertussis (Tdap, Td) vaccine. You may need a Td booster every 10 years.  Zoster vaccine. You may need this after age 58.  Pneumococcal 13-valent conjugate (PCV13) vaccine. One dose is recommended after age 53.  Pneumococcal polysaccharide (PPSV23) vaccine. One dose is recommended after age 65. Talk to your health care provider about which screenings and vaccines you need and how often you need them. This information is not intended to replace advice given to you by your health care provider. Make sure you discuss any questions you have with your health care provider. Document Released: 08/17/2015 Document Revised: 04/09/2016 Document Reviewed: 05/22/2015 Elsevier Interactive Patient Education  2017 [place] Prevention in the Home Falls can cause injuries. They can happen to people of all ages. There are many things you can do to make your home safe and to help prevent falls. What can I do on the outside of my home?  Regularly fix the edges of walkways and driveways and fix any cracks.  Remove anything that might make you trip as you walk through a door, such as a raised step or threshold.  Trim any bushes or trees on the path to your home.  Use bright outdoor lighting.  Clear any walking paths of anything that might make someone trip, such as rocks or tools.  Regularly check to see if handrails are loose or broken. Make sure that both sides of any steps have handrails.  Any raised decks and porches should have guardrails on the edges.  Have any leaves, snow, or ice cleared regularly.  Use sand or salt on walking paths during winter.  Clean up any spills in your garage right away. This includes oil or grease spills. What can I do in the bathroom?  Use night lights.  Install grab bars by the toilet and in the tub and shower. Do not use towel bars as grab bars.  Use non-skid mats or decals in the tub or shower.  If you need to sit down in the shower, use  a plastic, non-slip stool.  Keep the floor dry. Clean up any water that spills on the floor as soon as it happens.  Remove soap buildup in the tub or shower regularly.  Attach bath mats securely with double-sided non-slip rug tape.  Do not have throw rugs and other things on the floor that can make you trip. What can I do in the bedroom?  Use night lights.  Make sure that you have a light by your bed that is easy to reach.  Do not use any sheets or blankets that are too big for your bed. They should not hang down onto the floor.  Have a firm chair that has side arms. You can use this for support while you get dressed.  Do not have throw rugs and other things on the floor that can make you trip. What can I do in the kitchen?  Clean up any spills right away.  Avoid walking on wet floors.  Keep items that you use a lot in easy-to-reach places.  If you need to reach something above you, use a strong step stool that has a grab bar.  Keep electrical cords out of the way.  Do not use  floor polish or wax that makes floors slippery. If you must use wax, use non-skid floor wax.  Do not have throw rugs and other things on the floor that can make you trip. What can I do with my stairs?  Do not leave any items on the stairs.  Make sure that there are handrails on both sides of the stairs and use them. Fix handrails that are broken or loose. Make sure that handrails are as long as the stairways.  Check any carpeting to make sure that it is firmly attached to the stairs. Fix any carpet that is loose or worn.  Avoid having throw rugs at the top or bottom of the stairs. If you do have throw rugs, attach them to the floor with carpet tape.  Make sure that you have a light switch at the top of the stairs and the bottom of the stairs. If you do not have them, ask someone to add them for you. What else can I do to help prevent falls?  Wear shoes that:  Do not have high heels.  Have  rubber bottoms.  Are comfortable and fit you well.  Are closed at the toe. Do not wear sandals.  If you use a stepladder:  Make sure that it is fully opened. Do not climb a closed stepladder.  Make sure that both sides of the stepladder are locked into place.  Ask someone to hold it for you, if possible.  Clearly mark and make sure that you can see:  Any grab bars or handrails.  First and last steps.  Where the edge of each step is.  Use tools that help you move around (mobility aids) if they are needed. These include:  Canes.  Walkers.  Scooters.  Crutches.  Turn on the lights when you go into a dark area. Replace any light bulbs as soon as they burn out.  Set up your furniture so you have a clear path. Avoid moving your furniture around.  If any of your floors are uneven, fix them.  If there are any pets around you, be aware of where they are.  Review your medicines with your doctor. Some medicines can make you feel dizzy. This can increase your chance of falling. Ask your doctor what other things that you can do to help prevent falls. This information is not intended to replace advice given to you by your health care provider. Make sure you discuss any questions you have with your health care provider. Document Released: 05/17/2009 Document Revised: 12/27/2015 Document Reviewed: 08/25/2014 Elsevier Interactive Patient Education  2017 [employer].

## 2017-12-08 ENCOUNTER — Other Ambulatory Visit: Payer: Self-pay | Admitting: Unknown Physician Specialty

## 2017-12-10 ENCOUNTER — Encounter: Payer: Self-pay | Admitting: Family Medicine

## 2017-12-10 ENCOUNTER — Ambulatory Visit (INDEPENDENT_AMBULATORY_CARE_PROVIDER_SITE_OTHER): Payer: Medicare HMO | Admitting: Family Medicine

## 2017-12-10 VITALS — BP 108/63 | HR 86 | Temp 97.9°F | Wt 149.4 lb

## 2017-12-10 DIAGNOSIS — N3001 Acute cystitis with hematuria: Secondary | ICD-10-CM | POA: Diagnosis not present

## 2017-12-10 DIAGNOSIS — R399 Unspecified symptoms and signs involving the genitourinary system: Secondary | ICD-10-CM

## 2017-12-10 LAB — MICROSCOPIC EXAMINATION: WBC, UA: >30 /HPF — AB (ref 0–5)

## 2017-12-10 MED ORDER — NITROFURANTOIN MONOHYD MACRO 100 MG PO CAPS: 100.0000 mg | ORAL_CAPSULE | Freq: Two times a day (BID) | ORAL | 0 refills | Status: DC

## 2017-12-10 NOTE — Progress Notes (Signed)
BP 108/63 (BP Location: Left Arm, Patient Position: Sitting, Cuff Size: Normal)   Pulse 86   Temp 97.9 F (36.6 C) (Oral)   Wt 149 lb 6.4 oz (67.8 kg)   LMP  (LMP Unknown)   SpO2 94%   BMI 31.22 kg/m    Subjective:    [name], female    DOB: Jul 22, 1951, 67 y.o.   MRN: [id number]  HPI: Jordan Chan is a 67 y.o. female  Chief Complaint  Patient presents with  . Dysuria    Ongoing for 5 days  . Urinary Urgency   URINARY SYMPTOMS Duration: 5 days Dysuria: burning Urinary frequency: yes Urgency: yes Small volume voids: yes Symptom severity: moderate Urinary incontinence: no Foul odor: no Hematuria: no Abdominal pain: no Back pain: no Suprapubic pain/pressure: yes Flank pain: no Fever:  no Vomiting: no Relief with cranberry juice: no Relief with pyridium: no Status: worse Previous urinary tract infection: yes Recurrent urinary tract infection: no History of sexually transmitted disease: no Vaginal discharge: no Treatments attempted: pyridium, cranberry and increasing fluids    Relevant past medical, surgical, family and social history reviewed and updated as indicated. Interim medical history since our last visit reviewed. Allergies and medications reviewed and updated.  Review of Systems  Constitutional: Negative.   Respiratory: Negative.   Cardiovascular: Negative.   Genitourinary: Positive for decreased urine volume, dysuria, frequency, hematuria and urgency. Negative for difficulty urinating, dyspareunia, enuresis, flank pain, genital sores, menstrual problem, pelvic pain, vaginal bleeding, vaginal discharge and vaginal pain.  Psychiatric/Behavioral: Negative.     Per HPI unless specifically indicated above     Objective:    BP 108/63 (BP Location: Left Arm, Patient Position: Sitting, Cuff Size: Normal)   Pulse 86   Temp 97.9 F (36.6 C) (Oral)   Wt 149 lb 6.4 oz (67.8 kg)   LMP  (LMP Unknown)   SpO2 94%   BMI 31.22  kg/m   Wt Readings from Last 3 Encounters:  12/10/17 149 lb 6.4 oz (67.8 kg)  12/04/17 148 lb 12.8 oz (67.5 kg)  09/24/17 145 lb 12.8 oz (66.1 kg)    Physical Exam  Constitutional: She is oriented to person, place, and time. She appears well-developed and well-nourished. No distress.  HENT:  Head: Normocephalic and atraumatic.  Right Ear: Hearing normal.  Left Ear: Hearing normal.  Nose: Nose normal.  Eyes: Conjunctivae and lids are normal. Right eye exhibits no discharge. Left eye exhibits no discharge. No scleral icterus.  Cardiovascular: Normal rate, regular rhythm, normal heart sounds and intact distal pulses. Exam reveals no gallop and no friction rub.  No murmur heard. Pulmonary/Chest: Effort normal and breath sounds normal. No stridor. No respiratory distress. She has no wheezes. She has no rales. She exhibits no tenderness.  Abdominal: Soft. Bowel sounds are normal. She exhibits no distension and no mass. There is no tenderness. There is no rebound, no guarding and no CVA tenderness. No hernia.  Musculoskeletal: Normal range of motion.  Neurological: She is alert and oriented to person, place, and time.  Skin: Skin is warm, dry and intact. Capillary refill takes less than 2 seconds. No rash noted. She is not diaphoretic. No erythema. No pallor.  Psychiatric: She has a normal mood and affect. Her speech is normal and behavior is normal. Judgment and thought content normal. Cognition and memory are normal.  Nursing note and vitals reviewed.   Results for orders placed or performed in visit on 09/24/17  Comprehensive metabolic panel  Result Value Ref Range   Glucose 148 (H) 65 - 99 mg/dL   BUN 10 8 - 27 mg/dL   Creatinine, Ser 0.74 0.57 - 1.00 mg/dL   GFR calc non Af Amer 84 >59 mL/min/1.73   GFR calc Af Amer 97 >59 mL/min/1.73   BUN/Creatinine Ratio 14 12 - 28   Sodium 140 134 - 144 mmol/L   Potassium 4.7 3.5 - 5.2 mmol/L   Chloride 100 96 - 106 mmol/L   CO2 26 20 - 29  mmol/L   Calcium 9.2 8.7 - 10.3 mg/dL   Total Protein 7.3 6.0 - 8.5 g/dL   Albumin 3.9 3.6 - 4.8 g/dL   Globulin, Total 3.4 1.5 - 4.5 g/dL   Albumin/Globulin Ratio 1.1 (L) 1.2 - 2.2   Bilirubin Total 0.2 0.0 - 1.2 mg/dL   Alkaline Phosphatase 84 39 - 117 IU/L   AST 11 0 - 40 IU/L   ALT 10 0 - 32 IU/L  Bayer DCA Hb A1c Waived  Result Value Ref Range   Bayer DCA Hb A1c Waived 7.7 (H) <7.0 %  Lipid Panel w/o Chol/HDL Ratio  Result Value Ref Range   Cholesterol, Total 122 100 - 199 mg/dL   Triglycerides 77 0 - 149 mg/dL   HDL 76 >39 mg/dL   VLDL Cholesterol Cal 15 5 - 40 mg/dL   LDL Calculated 31 0 - 99 mg/dL      Assessment & Plan:   Problem List Items Addressed This Visit    None    Visit Diagnoses    Acute cystitis with hematuria    -  Primary   Will treat with abx. Call with any concerns. Continue to monitor. Call if not getting better.    UTI symptoms       +UTI   Relevant Orders   UA/M w/rflx Culture, Routine       Follow up plan: Return if symptoms worsen or fail to improve.

## 2017-12-12 LAB — UA/M W/RFLX CULTURE, ROUTINE
Bilirubin, UA: NEGATIVE
Glucose, UA: NEGATIVE
Ketones, UA: NEGATIVE
Nitrite, UA: POSITIVE — AB
Specific Gravity, UA: 1.015 (ref 1.005–1.030)
Urobilinogen, Ur: 0.2 mg/dL (ref 0.2–1.0)
pH, UA: 6.5 (ref 5.0–7.5)

## 2017-12-12 LAB — URINE CULTURE, REFLEX

## 2017-12-22 ENCOUNTER — Other Ambulatory Visit: Payer: Self-pay | Admitting: Unknown Physician Specialty

## 2017-12-23 ENCOUNTER — Ambulatory Visit (INDEPENDENT_AMBULATORY_CARE_PROVIDER_SITE_OTHER): Payer: Medicare HMO | Admitting: Unknown Physician Specialty

## 2017-12-23 ENCOUNTER — Encounter: Payer: Self-pay | Admitting: Unknown Physician Specialty

## 2017-12-23 VITALS — BP 127/81 | HR 84 | Temp 97.8°F | Ht 58.5 in | Wt 150.0 lb

## 2017-12-23 DIAGNOSIS — E1122 Type 2 diabetes mellitus with diabetic chronic kidney disease: Secondary | ICD-10-CM

## 2017-12-23 DIAGNOSIS — E538 Deficiency of other specified B group vitamins: Secondary | ICD-10-CM

## 2017-12-23 DIAGNOSIS — F324 Major depressive disorder, single episode, in partial remission: Secondary | ICD-10-CM | POA: Diagnosis not present

## 2017-12-23 DIAGNOSIS — E1165 Type 2 diabetes mellitus with hyperglycemia: Secondary | ICD-10-CM

## 2017-12-23 DIAGNOSIS — I129 Hypertensive chronic kidney disease with stage 1 through stage 4 chronic kidney disease, or unspecified chronic kidney disease: Secondary | ICD-10-CM

## 2017-12-23 DIAGNOSIS — R300 Dysuria: Secondary | ICD-10-CM

## 2017-12-23 DIAGNOSIS — IMO0002 Reserved for concepts with insufficient information to code with codable children: Secondary | ICD-10-CM

## 2017-12-23 LAB — UA/M W/RFLX CULTURE, ROUTINE
Bilirubin, UA: NEGATIVE
Glucose, UA: NEGATIVE
Ketones, UA: NEGATIVE
Leukocytes, UA: NEGATIVE
Nitrite, UA: NEGATIVE
Protein, UA: NEGATIVE
RBC, UA: NEGATIVE
Specific Gravity, UA: 1.015 (ref 1.005–1.030)
Urobilinogen, Ur: 0.2 mg/dL (ref 0.2–1.0)
pH, UA: 5 (ref 5.0–7.5)

## 2017-12-23 LAB — BAYER DCA HB A1C WAIVED: HB A1C (BAYER DCA - WAIVED): 8.1 % — ABNORMAL HIGH (ref <7.0)

## 2017-12-23 MED ORDER — ATORVASTATIN CALCIUM 20 MG PO TABS: 20.0000 mg | ORAL_TABLET | Freq: Every day | ORAL | 1 refills | Status: DC

## 2017-12-23 MED ORDER — GABAPENTIN 800 MG PO TABS: 800.0000 mg | ORAL_TABLET | Freq: Three times a day (TID) | ORAL | 1 refills | Status: DC

## 2017-12-23 MED ORDER — METOPROLOL SUCCINATE ER 25 MG PO TB24: 12.5000 mg | ORAL_TABLET | Freq: Every day | ORAL | 3 refills | Status: DC

## 2017-12-23 MED ORDER — INSULIN DETEMIR 100 UNIT/ML FLEXPEN
42.0000 [IU] | PEN_INJECTOR | Freq: Every day | SUBCUTANEOUS | 12 refills | Status: DC
Start: 2017-12-23 — End: 2018-10-20

## 2017-12-23 MED ORDER — DICLOFENAC SODIUM 50 MG PO TBEC: 50.0000 mg | DELAYED_RELEASE_TABLET | Freq: Three times a day (TID) | ORAL | 0 refills | Status: DC

## 2017-12-23 NOTE — Assessment & Plan Note (Signed)
Stable, continue present medications.   

## 2017-12-23 NOTE — Progress Notes (Signed)
BP 127/81   Pulse 84   Temp 97.8 F (36.6 C) (Oral)   Ht 4' 10.5" (1.486 m)   Wt 150 lb (68 kg)   LMP  (LMP Unknown)   SpO2 98%   BMI 30.82 kg/m    Subjective:    [name], female    DOB: 01-29-1951, 67 y.o.   MRN: [id number]  HPI: Jordan Chan is a 67 y.o. female  Chief Complaint  Patient presents with  . Depression  . Diabetes  . Hyperlipidemia  . Hypertension  . Urinary Tract Infection    pt states she is having a little bit of burning again   Diabetes: Using medications without difficulties.  Pt taking 42 units of insulin No hypoglycemic episodes No hyperglycemic episodes Feet problems: none Blood Sugars averaging: "some better"  121 this AM eye exam within last year Last Hgb A1C: 7.7  Hypertension  Using medications without difficulty Average home BPs Not checking   Using medication without problems or lightheadedness No chest pain with exertion or shortness of breath No Edema  Elevated Cholesterol Using medications without problems No Muscle aches  Diet: Exercise: Mostly sedentary.  Watches diet  Depression Depression screen [facility] 2/9 12/23/2017 12/04/2017 07/22/2017 06/09/2017 03/17/2017  Decreased Interest 0 0 1 2 1   Down, Depressed, Hopeless 1 1 1 1 2   PHQ - 2 Score 1 1 2 3 3   Altered sleeping 2 2 3 2 3   Tired, decreased energy 2 2 3 2 3   Change in appetite 0 0 0 2 0  Feeling bad or failure about yourself  0 0 0 0 0  Trouble concentrating 1 0 1 1 1   Moving slowly or fidgety/restless 0 0 0 0 0  Suicidal thoughts 0 0 0 0 0  PHQ-9 Score 6 5 9 10 10   Difficult doing work/chores - Not difficult at all - - -  Some recent data might be hidden   Urinary Tract Infection   This is a new (off antibiotics for 1-2 weeks) problem. The current episode started in the past 7 days. The quality of the pain is described as burning. She is not sexually active. Pertinent negatives include no chills, discharge, flank pain, frequency,  hematuria, hesitancy, nausea, possible pregnancy, sweats, urgency or vomiting. She has tried increased fluids for the symptoms. The treatment provided no relief.   Depression screen [facility] 2/9 12/23/2017 12/04/2017 07/22/2017 06/09/2017 03/17/2017  Decreased Interest 0 0 1 2 1   Down, Depressed, Hopeless 1 1 1 1 2   PHQ - 2 Score 1 1 2 3 3   Altered sleeping 2 2 3 2 3   Tired, decreased energy 2 2 3 2 3   Change in appetite 0 0 0 2 0  Feeling bad or failure about yourself  0 0 0 0 0  Trouble concentrating 1 0 1 1 1   Moving slowly or fidgety/restless 0 0 0 0 0  Suicidal thoughts 0 0 0 0 0  PHQ-9 Score 6 5 9 10 10   Difficult doing work/chores - Not difficult at all - - -  Some recent data might be hidden     Relevant past medical, surgical, family and social history reviewed and updated as indicated. Interim medical history since our last visit reviewed. Allergies and medications reviewed and updated.  Review of Systems  Constitutional: Negative for chills.  Gastrointestinal: Negative for nausea and vomiting.  Genitourinary: Negative for flank pain, frequency, hematuria, hesitancy and urgency.    Per  HPI unless specifically indicated above     Objective:    BP 127/81   Pulse 84   Temp 97.8 F (36.6 C) (Oral)   Ht 4' 10.5" (1.486 m)   Wt 150 lb (68 kg)   LMP  (LMP Unknown)   SpO2 98%   BMI 30.82 kg/m   Wt Readings from Last 3 Encounters:  12/23/17 150 lb (68 kg)  12/10/17 149 lb 6.4 oz (67.8 kg)  12/04/17 148 lb 12.8 oz (67.5 kg)    Physical Exam  Constitutional: She is oriented to person, place, and time. She appears well-developed and well-nourished. No distress.  HENT:  Head: Normocephalic and atraumatic.  Eyes: Conjunctivae and lids are normal. Right eye exhibits no discharge. Left eye exhibits no discharge. No scleral icterus.  Neck: Normal range of motion. Neck supple. No JVD present. Carotid bruit is not present.  Cardiovascular: Normal rate, regular rhythm and normal  heart sounds.  Pulmonary/Chest: Effort normal and breath sounds normal.  Abdominal: Normal appearance. There is no splenomegaly or hepatomegaly.  Musculoskeletal: Normal range of motion.  Neurological: She is alert and oriented to person, place, and time.  Skin: Skin is warm, dry and intact. No rash noted. No pallor.  Psychiatric: She has a normal mood and affect. Her behavior is normal. Judgment and thought content normal.    Results for orders placed or performed in visit on 12/10/17  Microscopic Examination  Result Value Ref Range   WBC, UA >30 (A) 0 - 5 /hpf   RBC, UA 3-10 (A) 0 - 2 /hpf   Epithelial Cells (non renal) CANCELED    Renal Epithel, UA 0-10 (A) None seen /hpf   Bacteria, UA Moderate (A) None seen/Few  Urine Culture, Reflex  Result Value Ref Range   Urine Culture, Routine Final report (A)    Organism ID, Bacteria Escherichia coli (A)    Antimicrobial Susceptibility Comment   UA/M w/rflx Culture, Routine  Result Value Ref Range   Specific Gravity, UA 1.015 1.005 - 1.030   pH, UA 6.5 5.0 - 7.5   Color, UA Yellow Yellow   Appearance Ur Turbid (A) Clear   Leukocytes, UA 1+ (A) Negative   Protein, UA 1+ (A) Negative/Trace   Glucose, UA Negative Negative   Ketones, UA Negative Negative   RBC, UA 2+ (A) Negative   Bilirubin, UA Negative Negative   Urobilinogen, Ur 0.2 0.2 - 1.0 mg/dL   Nitrite, UA Positive (A) Negative   Microscopic Examination See below:    Urinalysis Reflex Comment       Assessment & Plan:   Problem List Items Addressed This Visit      Unprioritized   Benign hypertension with chronic kidney disease    Stable, continue present medications.        Relevant Medications   atorvastatin (LIPITOR) 20 MG tablet   metoprolol succinate (TOPROL-XL) 25 MG 24 hr tablet   Depression, major, single episode, in partial remission (HCC)    Stable, continue present medications.        Uncontrolled type 2 diabetes mellitus with chronic kidney disease  (HCC)    Hgb A1C is 8.1% Discussed options.  Unable to increase nightly insulin as ofen BS in the 70's in the AM.  Will decrease Lantus to 30 units and start Ozempic .25mg  weekly.  Recheck in 1 months      Relevant Medications   atorvastatin (LIPITOR) 20 MG tablet   Insulin Detemir (LEVEMIR FLEXTOUCH) 100 UNIT/ML Pen  Other Relevant Orders   Comprehensive metabolic panel   Bayer DCA Hb A1c Waived    Other Visit Diagnoses    Burning with urination    -  Primary   Urine is normal.  Reassurance is given.     Relevant Orders   UA/M w/rflx Culture, Routine       Follow up plan: Return in about 1 month (around 01/20/2018).

## 2017-12-23 NOTE — Assessment & Plan Note (Signed)
Hgb A1C is 8.1% Discussed options.  Unable to increase nightly insulin as ofen BS in the 70's in the AM.  Will decrease Lantus to 30 units and start Ozempic .25mg  weekly.  Recheck in 1 months

## 2017-12-24 LAB — COMPREHENSIVE METABOLIC PANEL
Albumin: 4.2 g/dL (ref 3.6–4.8)
Alkaline Phosphatase: 71 IU/L (ref 39–117)
BUN/Creatinine Ratio: 18 (ref 12–28)
BUN: 13 mg/dL (ref 8–27)
CO2: 27 mmol/L (ref 20–29)
Calcium: 9.7 mg/dL (ref 8.7–10.3)
Creatinine, Ser: 0.73 mg/dL (ref 0.57–1.00)
GFR calc Af Amer: 99 mL/min/{1.73_m2} (ref >59)
GFR calc non Af Amer: 85 mL/min/{1.73_m2} (ref >59)
Globulin, Total: 3.3 g/dL (ref 1.5–4.5)
Glucose: 92 mg/dL (ref 65–99)
Sodium: 139 mmol/L (ref 134–144)
Total Protein: 7.5 g/dL (ref 6.0–8.5)

## 2017-12-24 LAB — COMPREHENSIVE METABOLIC PANEL WITH GFR
ALT: 8 IU/L (ref 0–32)
AST: 13 IU/L (ref 0–40)
Albumin/Globulin Ratio: 1.3 (ref 1.2–2.2)
Bilirubin Total: 0.2 mg/dL (ref 0.0–1.2)
Chloride: 98 mmol/L (ref 96–106)
Potassium: 5.3 mmol/L — ABNORMAL HIGH (ref 3.5–5.2)

## 2018-01-03 DIAGNOSIS — J449 Chronic obstructive pulmonary disease, unspecified: Secondary | ICD-10-CM | POA: Diagnosis not present

## 2018-01-18 DIAGNOSIS — J449 Chronic obstructive pulmonary disease, unspecified: Secondary | ICD-10-CM | POA: Diagnosis not present

## 2018-01-18 DIAGNOSIS — R918 Other nonspecific abnormal finding of lung field: Secondary | ICD-10-CM | POA: Diagnosis not present

## 2018-01-18 DIAGNOSIS — G4734 Idiopathic sleep related nonobstructive alveolar hypoventilation: Secondary | ICD-10-CM | POA: Diagnosis not present

## 2018-01-25 DIAGNOSIS — G8929 Other chronic pain: Secondary | ICD-10-CM | POA: Diagnosis not present

## 2018-01-25 DIAGNOSIS — G894 Chronic pain syndrome: Secondary | ICD-10-CM | POA: Diagnosis not present

## 2018-01-25 DIAGNOSIS — Z9689 Presence of other specified functional implants: Secondary | ICD-10-CM | POA: Diagnosis not present

## 2018-01-25 DIAGNOSIS — M545 Low back pain: Secondary | ICD-10-CM | POA: Diagnosis not present

## 2018-02-02 DIAGNOSIS — J449 Chronic obstructive pulmonary disease, unspecified: Secondary | ICD-10-CM | POA: Diagnosis not present

## 2018-02-03 ENCOUNTER — Other Ambulatory Visit: Payer: Self-pay | Admitting: Unknown Physician Specialty

## 2018-02-03 DIAGNOSIS — Z1231 Encounter for screening mammogram for malignant neoplasm of breast: Secondary | ICD-10-CM

## 2018-02-05 ENCOUNTER — Ambulatory Visit (INDEPENDENT_AMBULATORY_CARE_PROVIDER_SITE_OTHER): Payer: Medicare HMO | Admitting: Unknown Physician Specialty

## 2018-02-05 ENCOUNTER — Other Ambulatory Visit: Payer: Self-pay

## 2018-02-05 ENCOUNTER — Encounter: Payer: Self-pay | Admitting: Unknown Physician Specialty

## 2018-02-05 DIAGNOSIS — E538 Deficiency of other specified B group vitamins: Secondary | ICD-10-CM

## 2018-02-05 DIAGNOSIS — E1122 Type 2 diabetes mellitus with diabetic chronic kidney disease: Secondary | ICD-10-CM

## 2018-02-05 DIAGNOSIS — E1165 Type 2 diabetes mellitus with hyperglycemia: Secondary | ICD-10-CM | POA: Diagnosis not present

## 2018-02-05 DIAGNOSIS — IMO0002 Reserved for concepts with insufficient information to code with codable children: Secondary | ICD-10-CM

## 2018-02-05 MED ORDER — SITAGLIPTIN PHOSPHATE 100 MG PO TABS: 100.0000 mg | ORAL_TABLET | Freq: Every day | ORAL | 0 refills | Status: DC

## 2018-02-05 NOTE — Assessment & Plan Note (Signed)
GLP 1 class not tolerated.  High risk of hypoglycemia with normal BS in the AM.  Will start Januvia 100 mg.  Recheck 2 months

## 2018-02-05 NOTE — Progress Notes (Signed)
BP 134/80   Pulse 96   Temp 98.8 F (37.1 C) (Oral)   Ht 4' 10.5" (1.486 m)   Wt 148 lb 9.6 oz (67.4 kg)   LMP  (LMP Unknown)   SpO2 96%   BMI 30.53 kg/m    Subjective:    [name], female    DOB: 1951-02-26, 67 y.o.   MRN: [id number]  HPI: Jordan Chan is a 67 y.o. female  Chief Complaint  Patient presents with  . Diabetes    pt states she could not take the ozempic, made her very nauseous   Diabetes: Pt states she was extremely nauseated with Ozempic and unable to tolerate.  Took it for 3 weeks.  She did not notice any changes in her blood sugar.  She also tried Trulicity also with lack of tolerability which is not specified.    Depression screen [facility] 2/9 02/05/2018 12/23/2017 12/04/2017 07/22/2017 06/09/2017  Decreased Interest 0 0 0 1 2  Down, Depressed, Hopeless 1 1 1 1 1   PHQ - 2 Score 1 1 1 2 3   Altered sleeping 2 2 2 3 2   Tired, decreased energy 2 2 2 3 2   Change in appetite 0 0 0 0 2  Feeling bad or failure about yourself  0 0 0 0 0  Trouble concentrating 1 1 0 1 1  Moving slowly or fidgety/restless 0 0 0 0 0  Suicidal thoughts 0 0 0 0 0  PHQ-9 Score 6 6 5 9 10   Difficult doing work/chores - - Not difficult at all - -  Some recent data might be hidden    Relevant past medical, surgical, family and social history reviewed and updated as indicated. Interim medical history since our last visit reviewed. Allergies and medications reviewed and updated.  Review of Systems  Per HPI unless specifically indicated above     Objective:    BP 134/80   Pulse 96   Temp 98.8 F (37.1 C) (Oral)   Ht 4' 10.5" (1.486 m)   Wt 148 lb 9.6 oz (67.4 kg)   LMP  (LMP Unknown)   SpO2 96%   BMI 30.53 kg/m   Wt Readings from Last 3 Encounters:  02/05/18 148 lb 9.6 oz (67.4 kg)  12/23/17 150 lb (68 kg)  12/10/17 149 lb 6.4 oz (67.8 kg)    Physical Exam  Constitutional: She is oriented to person, place, and time. She appears well-developed and  well-nourished. No distress.  HENT:  Head: Normocephalic and atraumatic.  Eyes: Conjunctivae and lids are normal. Right eye exhibits no discharge. Left eye exhibits no discharge. No scleral icterus.  Cardiovascular: Normal rate.  Pulmonary/Chest: Effort normal.  Abdominal: Normal appearance. There is no splenomegaly or hepatomegaly.  Musculoskeletal: Normal range of motion.  Neurological: She is alert and oriented to person, place, and time.  Skin: Skin is intact. No rash noted. No pallor.  Psychiatric: She has a normal mood and affect. Her behavior is normal. Judgment and thought content normal.    Results for orders placed or performed in visit on 12/23/17  UA/M w/rflx Culture, Routine  Result Value Ref Range   Specific Gravity, UA 1.015 1.005 - 1.030   pH, UA 5.0 5.0 - 7.5   Color, UA Yellow Yellow   Appearance Ur Clear Clear   Leukocytes, UA Negative Negative   Protein, UA Negative Negative/Trace   Glucose, UA Negative Negative   Ketones, UA Negative Negative   RBC, UA  Negative Negative   Bilirubin, UA Negative Negative   Urobilinogen, Ur 0.2 0.2 - 1.0 mg/dL   Nitrite, UA Negative Negative  Comprehensive metabolic panel  Result Value Ref Range   Glucose 92 65 - 99 mg/dL   BUN 13 8 - 27 mg/dL   Creatinine, Ser 0.73 0.57 - 1.00 mg/dL   GFR calc non Af Amer 85 >59 mL/min/1.73   GFR calc Af Amer 99 >59 mL/min/1.73   BUN/Creatinine Ratio 18 12 - 28   Sodium 139 134 - 144 mmol/L   Potassium 5.3 (H) 3.5 - 5.2 mmol/L   Chloride 98 96 - 106 mmol/L   CO2 27 20 - 29 mmol/L   Calcium 9.7 8.7 - 10.3 mg/dL   Total Protein 7.5 6.0 - 8.5 g/dL   Albumin 4.2 3.6 - 4.8 g/dL   Globulin, Total 3.3 1.5 - 4.5 g/dL   Albumin/Globulin Ratio 1.3 1.2 - 2.2   Bilirubin Total 0.2 0.0 - 1.2 mg/dL   Alkaline Phosphatase 71 39 - 117 IU/L   AST 13 0 - 40 IU/L   ALT 8 0 - 32 IU/L  Bayer DCA Hb A1c Waived  Result Value Ref Range   HB A1C (BAYER DCA - WAIVED) 8.1 (H) <7.0 %      Assessment &  Plan:   Problem List Items Addressed This Visit      Unprioritized   Uncontrolled type 2 diabetes mellitus with chronic kidney disease ([place])    GLP 1 class not tolerated.  High risk of hypoglycemia with normal BS in the AM.  Will start Januvia 100 mg.  Recheck 2 months      Relevant Medications   sitaGLIPtin (JANUVIA) 100 MG tablet       Follow up plan: Return in about 2 months (around 04/08/2018).

## 2018-02-24 ENCOUNTER — Other Ambulatory Visit: Payer: Self-pay | Admitting: Unknown Physician Specialty

## 2018-03-05 DIAGNOSIS — J449 Chronic obstructive pulmonary disease, unspecified: Secondary | ICD-10-CM | POA: Diagnosis not present

## 2018-03-08 ENCOUNTER — Ambulatory Visit
Admission: RE | Admit: 2018-03-08 | Discharge: 2018-03-08 | Disposition: A | Discharge status: Home / Self Care | Payer: Medicare HMO | Source: Ambulatory Visit | Attending: Unknown Physician Specialty | Admitting: Unknown Physician Specialty

## 2018-03-08 DIAGNOSIS — Z1231 Encounter for screening mammogram for malignant neoplasm of breast: Secondary | ICD-10-CM | POA: Diagnosis present

## 2018-03-08 HISTORY — DX: Personal history of antineoplastic chemotherapy: Z92.21

## 2018-03-08 HISTORY — DX: Personal history of irradiation: Z92.3

## 2018-04-05 ENCOUNTER — Other Ambulatory Visit: Payer: Self-pay | Admitting: Unknown Physician Specialty

## 2018-04-05 DIAGNOSIS — J449 Chronic obstructive pulmonary disease, unspecified: Secondary | ICD-10-CM | POA: Diagnosis not present

## 2018-04-06 NOTE — Telephone Encounter (Signed)
Metoprolol succinate 25 mg refill Last Refill:12/23/17 # 30 tabs and 3 refills (pt taking a half tab every day) Last OV: 02/05/18 PCP: former pt of Kathrine Haddock Carles [facility]) Pharmacy: [facility]  Lisinopril 5 mg refill Last Refill:06/24/17 # 90 and 3 refills  NOV  04/14/18  To early to refill prescriptions

## 2018-04-12 DIAGNOSIS — G8929 Other chronic pain: Secondary | ICD-10-CM | POA: Diagnosis not present

## 2018-04-12 DIAGNOSIS — M542 Cervicalgia: Secondary | ICD-10-CM | POA: Diagnosis not present

## 2018-04-12 DIAGNOSIS — M545 Low back pain: Secondary | ICD-10-CM | POA: Diagnosis not present

## 2018-04-12 DIAGNOSIS — G894 Chronic pain syndrome: Secondary | ICD-10-CM | POA: Diagnosis not present

## 2018-04-13 ENCOUNTER — Other Ambulatory Visit: Payer: Self-pay | Admitting: Unknown Physician Specialty

## 2018-04-13 NOTE — Telephone Encounter (Signed)
Noted to return for follow up in 2 months after starting Januvia. Upcoming appointment tomorrow, 04/14/18 with Dr. Wynetta Emery.  Januvia refill Last Refill:02/05/18 # 90/0 refill Last OV: 02/05/18 PCP: [place]: [facility] - [place], [place] [phone] (Phone) [phone] (Fax)

## 2018-04-14 ENCOUNTER — Ambulatory Visit (INDEPENDENT_AMBULATORY_CARE_PROVIDER_SITE_OTHER): Payer: Medicare HMO | Admitting: Family Medicine

## 2018-04-14 ENCOUNTER — Encounter: Payer: Self-pay | Admitting: Family Medicine

## 2018-04-14 VITALS — BP 123/73 | HR 78 | Temp 98.4°F | Wt 151.5 lb

## 2018-04-14 DIAGNOSIS — IMO0002 Reserved for concepts with insufficient information to code with codable children: Secondary | ICD-10-CM

## 2018-04-14 DIAGNOSIS — Z23 Encounter for immunization: Secondary | ICD-10-CM | POA: Diagnosis not present

## 2018-04-14 DIAGNOSIS — E1165 Type 2 diabetes mellitus with hyperglycemia: Secondary | ICD-10-CM | POA: Diagnosis not present

## 2018-04-14 DIAGNOSIS — E1122 Type 2 diabetes mellitus with diabetic chronic kidney disease: Secondary | ICD-10-CM

## 2018-04-14 DIAGNOSIS — E538 Deficiency of other specified B group vitamins: Secondary | ICD-10-CM

## 2018-04-14 LAB — BAYER DCA HB A1C WAIVED: HB A1C (BAYER DCA - WAIVED): 7.4 % — ABNORMAL HIGH (ref <7.0)

## 2018-04-14 MED ORDER — LISINOPRIL 5 MG PO TABS: 5.0000 mg | ORAL_TABLET | Freq: Every day | ORAL | 1 refills | Status: DC

## 2018-04-14 MED ORDER — METOPROLOL SUCCINATE ER 25 MG PO TB24: 12.5000 mg | ORAL_TABLET | Freq: Every day | ORAL | 1 refills | Status: DC

## 2018-04-14 MED ORDER — SITAGLIPTIN PHOSPHATE 100 MG PO TABS: 100.0000 mg | ORAL_TABLET | Freq: Every day | ORAL | 1 refills | Status: DC

## 2018-04-14 NOTE — Progress Notes (Signed)
BP 123/73 (BP Location: Left Arm, Patient Position: Sitting, Cuff Size: Normal)   Pulse 78   Temp 98.4 F (36.9 C)   Wt 151 lb 8 oz (68.7 kg)   LMP  (LMP Unknown)   SpO2 94%   BMI 31.12 kg/m    Subjective:    [name], female    DOB: 02-Jun-1951, 67 y.o.   MRN: [id number]  HPI: ANGELIN CUTRONE is a 67 y.o. female  Chief Complaint  Patient presents with  . Diabetes   DIABETES- doing well with the [place]. Feeling well with it. No concerns.  Hypoglycemic episodes:no Polydipsia/polyuria: no Visual disturbance: no Chest pain: no Paresthesias: no Glucose Monitoring: yes- very labile Taking Insulin?: yes Blood Pressure Monitoring: not checking Retinal Examination: Up to Date Foot Exam: Up to Date Diabetic Education: Completed Pneumovax: Up to Date Influenza: Given today Aspirin: yes  Relevant past medical, surgical, family and social history reviewed and updated as indicated. Interim medical history since our last visit reviewed. Allergies and medications reviewed and updated.  Review of Systems  Constitutional: Negative.   Respiratory: Positive for shortness of breath. Negative for apnea, cough, choking, chest tightness, wheezing and stridor.   Cardiovascular: Negative.   Musculoskeletal: Positive for back pain, myalgias, neck pain and neck stiffness. Negative for arthralgias, gait problem and joint swelling.  Skin: Negative.   Psychiatric/Behavioral: Negative.     Per HPI unless specifically indicated above     Objective:    BP 123/73 (BP Location: Left Arm, Patient Position: Sitting, Cuff Size: Normal)   Pulse 78   Temp 98.4 F (36.9 C)   Wt 151 lb 8 oz (68.7 kg)   LMP  (LMP Unknown)   SpO2 94%   BMI 31.12 kg/m   Wt Readings from Last 3 Encounters:  04/14/18 151 lb 8 oz (68.7 kg)  02/05/18 148 lb 9.6 oz (67.4 kg)  12/23/17 150 lb (68 kg)    Physical Exam  Constitutional: She is oriented to person, place, and time. She appears  well-developed and well-nourished. No distress.  HENT:  Head: Normocephalic and atraumatic.  Right Ear: Hearing normal.  Left Ear: Hearing normal.  Nose: Nose normal.  Eyes: Conjunctivae and lids are normal. Right eye exhibits no discharge. Left eye exhibits no discharge. No scleral icterus.  Cardiovascular: Normal rate, regular rhythm, normal heart sounds and intact distal pulses. Exam reveals no gallop and no friction rub.  No murmur heard. Pulmonary/Chest: Effort normal and breath sounds normal. No stridor. No respiratory distress. She has no wheezes. She has no rales. She exhibits no tenderness.  Musculoskeletal: Normal range of motion.  Neurological: She is alert and oriented to person, place, and time.  Skin: Skin is warm, dry and intact. Capillary refill takes less than 2 seconds. No rash noted. She is not diaphoretic. No erythema. No pallor.  Psychiatric: She has a normal mood and affect. Her speech is normal and behavior is normal. Judgment and thought content normal. Cognition and memory are normal.  Nursing note and vitals reviewed.   Results for orders placed or performed in visit on 12/23/17  UA/M w/rflx Culture, Routine  Result Value Ref Range   Specific Gravity, UA 1.015 1.005 - 1.030   pH, UA 5.0 5.0 - 7.5   Color, UA Yellow Yellow   Appearance Ur Clear Clear   Leukocytes, UA Negative Negative   Protein, UA Negative Negative/Trace   Glucose, UA Negative Negative   Ketones, UA Negative Negative  RBC, UA Negative Negative   Bilirubin, UA Negative Negative   Urobilinogen, Ur 0.2 0.2 - 1.0 mg/dL   Nitrite, UA Negative Negative  Comprehensive metabolic panel  Result Value Ref Range   Glucose 92 65 - 99 mg/dL   BUN 13 8 - 27 mg/dL   Creatinine, Ser 0.73 0.57 - 1.00 mg/dL   GFR calc non Af Amer 85 >59 mL/min/1.73   GFR calc Af Amer 99 >59 mL/min/1.73   BUN/Creatinine Ratio 18 12 - 28   Sodium 139 134 - 144 mmol/L   Potassium 5.3 (H) 3.5 - 5.2 mmol/L   Chloride 98  96 - 106 mmol/L   CO2 27 20 - 29 mmol/L   Calcium 9.7 8.7 - 10.3 mg/dL   Total Protein 7.5 6.0 - 8.5 g/dL   Albumin 4.2 3.6 - 4.8 g/dL   Globulin, Total 3.3 1.5 - 4.5 g/dL   Albumin/Globulin Ratio 1.3 1.2 - 2.2   Bilirubin Total 0.2 0.0 - 1.2 mg/dL   Alkaline Phosphatase 71 39 - 117 IU/L   AST 13 0 - 40 IU/L   ALT 8 0 - 32 IU/L  Bayer DCA Hb A1c Waived  Result Value Ref Range   HB A1C (BAYER DCA - WAIVED) 8.1 (H) <7.0 %      Assessment & Plan:   Problem List Items Addressed This Visit      Endocrine   Uncontrolled type 2 diabetes mellitus with chronic kidney disease ([place]) - Primary    Doing better with A1c of 7.4 down from 8.1. Continue current regimen. Continue to monitor. Call with any concerns.       Relevant Medications   lisinopril (PRINIVIL,ZESTRIL) 5 MG tablet   sitaGLIPtin (JANUVIA) 100 MG tablet   Other Relevant Orders   Basic metabolic panel   Bayer DCA Hb A1c Waived    Other Visit Diagnoses    Immunization due       Flu shot given today.   Relevant Orders   Flu vaccine HIGH DOSE PF (Fluzone High dose) (Completed)       Follow up plan: Return in about 3 months (around 07/14/2018) for with Jolene.

## 2018-04-14 NOTE — Assessment & Plan Note (Signed)
Doing better with A1c of 7.4 down from 8.1. Continue current regimen. Continue to monitor. Call with any concerns.

## 2018-04-15 LAB — BASIC METABOLIC PANEL WITH GFR
CO2: 26 mmol/L (ref 20–29)
Creatinine, Ser: 0.72 mg/dL (ref 0.57–1.00)
Glucose: 184 mg/dL — ABNORMAL HIGH (ref 65–99)
Sodium: 139 mmol/L (ref 134–144)

## 2018-04-15 LAB — BASIC METABOLIC PANEL
BUN/Creatinine Ratio: 15 (ref 12–28)
BUN: 11 mg/dL (ref 8–27)
Calcium: 9.6 mg/dL (ref 8.7–10.3)
Chloride: 98 mmol/L (ref 96–106)
GFR calc Af Amer: 100 mL/min/{1.73_m2} (ref >59)
GFR calc non Af Amer: 86 mL/min/{1.73_m2} (ref >59)
Potassium: 5.3 mmol/L — ABNORMAL HIGH (ref 3.5–5.2)

## 2018-04-19 ENCOUNTER — Telehealth: Payer: Self-pay | Admitting: Family Medicine

## 2018-04-19 DIAGNOSIS — E875 Hyperkalemia: Secondary | ICD-10-CM

## 2018-04-19 NOTE — Telephone Encounter (Signed)
Patient notified, appointment scheduled

## 2018-04-19 NOTE — Telephone Encounter (Signed)
Please let her know that her potassium came back a bit high. I'd like her to avoid bananas and citrus and come back in in 2 weeks for a recheck. Order in.

## 2018-05-03 ENCOUNTER — Other Ambulatory Visit: Payer: Medicare HMO

## 2018-05-03 DIAGNOSIS — E875 Hyperkalemia: Secondary | ICD-10-CM

## 2018-05-04 ENCOUNTER — Encounter: Payer: Self-pay | Admitting: Family Medicine

## 2018-05-04 LAB — BASIC METABOLIC PANEL WITH GFR
BUN/Creatinine Ratio: 14 (ref 12–28)
BUN: 10 mg/dL (ref 8–27)
Creatinine, Ser: 0.74 mg/dL (ref 0.57–1.00)
GFR calc Af Amer: 96 mL/min/{1.73_m2} (ref >59)
Sodium: 141 mmol/L (ref 134–144)

## 2018-05-04 LAB — BASIC METABOLIC PANEL
CO2: 27 mmol/L (ref 20–29)
Calcium: 9.6 mg/dL (ref 8.7–10.3)
Chloride: 99 mmol/L (ref 96–106)
GFR calc non Af Amer: 84 mL/min/{1.73_m2} (ref >59)
Glucose: 190 mg/dL — ABNORMAL HIGH (ref 65–99)
Potassium: 5.1 mmol/L (ref 3.5–5.2)

## 2018-05-05 DIAGNOSIS — J449 Chronic obstructive pulmonary disease, unspecified: Secondary | ICD-10-CM | POA: Diagnosis not present

## 2018-05-13 ENCOUNTER — Other Ambulatory Visit: Payer: Self-pay | Admitting: Unknown Physician Specialty

## 2018-05-13 NOTE — Telephone Encounter (Signed)
Requested Prescriptions  Pending Prescriptions Disp Refills  . atorvastatin (LIPITOR) 20 MG tablet [Pharmacy Med Name: ATORVASTATIN CALCIUM 20 MG Tablet] 90 tablet 1    Sig: TAKE 1 TABLET EVERY DAY     Cardiovascular:  Antilipid - Statins Passed - 05/13/2018  1:59 PM      Passed - Total Cholesterol in normal range and within 360 days    Cholesterol, Total  Date Value Ref Range Status  09/24/2017 122 100 - 199 mg/dL Final   Cholesterol Piccolo, Waived  Date Value Ref Range Status  10/01/2015 132 <200 mg/dL Final    Comment:                            Desirable                <200                         Borderline High      200- 239                         High                     >239          Passed - LDL in normal range and within 360 days    LDL Calculated  Date Value Ref Range Status  09/24/2017 31 0 - 99 mg/dL Final         Passed - HDL in normal range and within 360 days    HDL  Date Value Ref Range Status  09/24/2017 76 >39 mg/dL Final         Passed - Triglycerides in normal range and within 360 days    Triglycerides  Date Value Ref Range Status  09/24/2017 77 0 - 149 mg/dL Final   Triglycerides Piccolo,Waived  Date Value Ref Range Status  10/01/2015 79 <150 mg/dL Final    Comment:                            Normal                   <150                         Borderline High     150 - 199                         High                200 - 499                         Very High                >499          Passed - Patient is not pregnant      Passed - Valid encounter within last 12 months    Recent Outpatient Visits          4 weeks ago Uncontrolled type 2 diabetes mellitus with chronic kidney disease ([place])   [facility] Johnson, Megan P, DO   3 months ago Uncontrolled type 2 diabetes mellitus with chronic kidney disease (  [place])   [facility] Kathrine Haddock, NP   4 months ago Burning with urination   [facility]  Kathrine Haddock, NP   5 months ago Acute cystitis with hematuria   [place], Megan P, DO   7 months ago B12 deficiency   [facility] Kathrine Haddock, NP      Future Appointments            In 2 months [name], [name], NP [facility], [place]   In 7 months  [facility]

## 2018-05-26 ENCOUNTER — Other Ambulatory Visit: Payer: Self-pay | Admitting: Unknown Physician Specialty

## 2018-05-26 NOTE — Telephone Encounter (Signed)
Patient has establish care appointment with J.[name]- 12/12. Refills given until that appointment- she will review Rx and continue them at that time. Requested Prescriptions  Pending Prescriptions Disp Refills  . venlafaxine XR (EFFEXOR-XR) 150 MG 24 hr capsule [Pharmacy Med Name: VENLAFAXINE HCL ER 150 MG Capsule Extended Release 24 Hour] 90 capsule 0    Sig: TAKE 1 CAPSULE EVERY DAY     Psychiatry: Antidepressants - SNRI - desvenlafaxine & venlafaxine Passed - 05/26/2018  2:57 PM      Passed - LDL in normal range and within 360 days    LDL Calculated  Date Value Ref Range Status  09/24/2017 31 0 - 99 mg/dL Final         Passed - Total Cholesterol in normal range and within 360 days    Cholesterol, Total  Date Value Ref Range Status  09/24/2017 122 100 - 199 mg/dL Final   Cholesterol Piccolo, Waived  Date Value Ref Range Status  10/01/2015 132 <200 mg/dL Final    Comment:                            Desirable                <200                         Borderline High      200- 239                         High                     >239          Passed - Triglycerides in normal range and within 360 days    Triglycerides  Date Value Ref Range Status  09/24/2017 77 0 - 149 mg/dL Final   Triglycerides Piccolo,Waived  Date Value Ref Range Status  10/01/2015 79 <150 mg/dL Final    Comment:                            Normal                   <150                         Borderline High     150 - 199                         High                200 - 499                         Very High                >499          Passed - Completed PHQ-2 or PHQ-9 in the last 360 days.      Passed - Last BP in normal range    BP Readings from Last 1 Encounters:  04/14/18 123/73         Passed - Valid encounter within last 6 months    Recent Outpatient Visits          1 month ago Uncontrolled  type 2 diabetes mellitus with chronic kidney disease ([place])   [place],  Megan P, DO   3 months ago Uncontrolled type 2 diabetes mellitus with chronic kidney disease ([place])   [place] Kathrine Haddock, NP   5 months ago Burning with urination   [place], NP   5 months ago Acute cystitis with hematuria   [place], Megan P, DO   8 months ago B12 deficiency   [facility] Kathrine Haddock, NP      Future Appointments            In 1 month [name], [name], NP [facility]   In 6 months  [facility]

## 2018-05-31 DIAGNOSIS — I480 Paroxysmal atrial fibrillation: Secondary | ICD-10-CM | POA: Diagnosis not present

## 2018-05-31 DIAGNOSIS — I4891 Unspecified atrial fibrillation: Secondary | ICD-10-CM | POA: Diagnosis not present
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
## 2018-06-05 DIAGNOSIS — J449 Chronic obstructive pulmonary disease, unspecified: Secondary | ICD-10-CM | POA: Diagnosis not present

## 2018-06-08 ENCOUNTER — Ambulatory Visit (INDEPENDENT_AMBULATORY_CARE_PROVIDER_SITE_OTHER): Payer: Medicare HMO

## 2018-06-08 DIAGNOSIS — E538 Deficiency of other specified B group vitamins: Secondary | ICD-10-CM | POA: Diagnosis not present

## 2018-06-21 DIAGNOSIS — Z5181 Encounter for therapeutic drug level monitoring: Secondary | ICD-10-CM | POA: Diagnosis not present

## 2018-06-21 DIAGNOSIS — G894 Chronic pain syndrome: Secondary | ICD-10-CM | POA: Diagnosis not present

## 2018-06-21 DIAGNOSIS — Z978 Presence of other specified devices: Secondary | ICD-10-CM | POA: Diagnosis not present

## 2018-06-21 DIAGNOSIS — Z79899 Other long term (current) drug therapy: Secondary | ICD-10-CM | POA: Diagnosis not present

## 2018-06-22 DIAGNOSIS — Z9981 Dependence on supplemental oxygen: Secondary | ICD-10-CM | POA: Diagnosis not present

## 2018-06-22 DIAGNOSIS — G4734 Idiopathic sleep related nonobstructive alveolar hypoventilation: Secondary | ICD-10-CM | POA: Diagnosis not present

## 2018-06-22 DIAGNOSIS — J449 Chronic obstructive pulmonary disease, unspecified: Secondary | ICD-10-CM | POA: Diagnosis not present

## 2018-06-22 DIAGNOSIS — R0609 Other forms of dyspnea: Secondary | ICD-10-CM | POA: Diagnosis not present

## 2018-06-23 ENCOUNTER — Other Ambulatory Visit: Payer: Self-pay | Admitting: Unknown Physician Specialty

## 2018-07-05 DIAGNOSIS — J449 Chronic obstructive pulmonary disease, unspecified: Secondary | ICD-10-CM | POA: Diagnosis not present

## 2018-07-15 ENCOUNTER — Ambulatory Visit (INDEPENDENT_AMBULATORY_CARE_PROVIDER_SITE_OTHER): Payer: Medicare HMO | Admitting: Nurse Practitioner

## 2018-07-15 ENCOUNTER — Other Ambulatory Visit: Payer: Self-pay | Admitting: Nurse Practitioner

## 2018-07-15 ENCOUNTER — Encounter: Payer: Self-pay | Admitting: Nurse Practitioner

## 2018-07-15 VITALS — BP 133/73 | HR 89 | Temp 98.2°F | Wt 152.6 lb

## 2018-07-15 DIAGNOSIS — F324 Major depressive disorder, single episode, in partial remission: Secondary | ICD-10-CM

## 2018-07-15 DIAGNOSIS — E1122 Type 2 diabetes mellitus with diabetic chronic kidney disease: Secondary | ICD-10-CM | POA: Diagnosis not present

## 2018-07-15 DIAGNOSIS — E1165 Type 2 diabetes mellitus with hyperglycemia: Secondary | ICD-10-CM | POA: Diagnosis not present

## 2018-07-15 DIAGNOSIS — I129 Hypertensive chronic kidney disease with stage 1 through stage 4 chronic kidney disease, or unspecified chronic kidney disease: Secondary | ICD-10-CM | POA: Diagnosis not present

## 2018-07-15 DIAGNOSIS — E538 Deficiency of other specified B group vitamins: Secondary | ICD-10-CM | POA: Diagnosis not present

## 2018-07-15 DIAGNOSIS — E782 Mixed hyperlipidemia: Secondary | ICD-10-CM

## 2018-07-15 DIAGNOSIS — IMO0002 Reserved for concepts with insufficient information to code with codable children: Secondary | ICD-10-CM

## 2018-07-15 LAB — BAYER DCA HB A1C WAIVED: HB A1C (BAYER DCA - WAIVED): 7.5 % — ABNORMAL HIGH (ref <7.0)

## 2018-07-15 NOTE — Assessment & Plan Note (Signed)
Chronic, stable.  Continue current regimen.  CMP and lipid today.

## 2018-07-15 NOTE — Assessment & Plan Note (Signed)
Chronic, stable. Continue current regimen. 

## 2018-07-15 NOTE — Assessment & Plan Note (Signed)
Chronic, ongoing.  Previous A1C 7.4% and today 7.5%, due to occasional episodes of BS <70 will maintain current regimen to avoid tight control.  Return in 3 months for recheck.

## 2018-07-15 NOTE — Assessment & Plan Note (Signed)
Injection today.  B12 level drawn.  Takes daily Metformin.

## 2018-07-15 NOTE — Patient Instructions (Signed)
Diabetes Mellitus and Nutrition When you have diabetes (diabetes mellitus), it is very important to have healthy eating habits because your blood sugar (glucose) levels are greatly affected by what you eat and drink. Eating healthy foods in the appropriate amounts, at about the same times every day, can help you:  Control your blood glucose.  Lower your risk of heart disease.  Improve your blood pressure.  Reach or maintain a healthy weight.  Every person with diabetes is different, and each person has different needs for a meal plan. Your health care provider may recommend that you work with a diet and nutrition specialist (dietitian) to make a meal plan that is best for you. Your meal plan may vary depending on factors such as:  The calories you need.  The medicines you take.  Your weight.  Your blood glucose, blood pressure, and cholesterol levels.  Your activity level.  Other health conditions you have, such as heart or kidney disease.  How do carbohydrates affect me? Carbohydrates affect your blood glucose level more than any other type of food. Eating carbohydrates naturally increases the amount of glucose in your blood. Carbohydrate counting is a method for keeping track of how many carbohydrates you eat. Counting carbohydrates is important to keep your blood glucose at a healthy level, especially if you use insulin or take certain oral diabetes medicines. It is important to know how many carbohydrates you can safely have in each meal. This is different for every person. Your dietitian can help you calculate how many carbohydrates you should have at each meal and for snack. Foods that contain carbohydrates include:  Bread, cereal, rice, pasta, and crackers.  Potatoes and corn.  Peas, beans, and lentils.  Milk and yogurt.  Fruit and juice.  Desserts, such as cakes, cookies, ice cream, and candy.  How does alcohol affect me? Alcohol can cause a sudden decrease in blood  glucose (hypoglycemia), especially if you use insulin or take certain oral diabetes medicines. Hypoglycemia can be a life-threatening condition. Symptoms of hypoglycemia (sleepiness, dizziness, and confusion) are similar to symptoms of having too much alcohol. If your health care provider says that alcohol is safe for you, follow these guidelines:  Limit alcohol intake to no more than 1 drink per day for nonpregnant women and 2 drinks per day for men. One drink equals 12 oz of beer, 5 oz of wine, or 1 oz of hard liquor.  Do not drink on an empty stomach.  Keep yourself hydrated with water, diet soda, or unsweetened iced tea.  Keep in mind that regular soda, juice, and other mixers may contain a lot of sugar and must be counted as carbohydrates.  What are tips for following this plan? Reading food labels  Start by checking the serving size on the label. The amount of calories, carbohydrates, fats, and other nutrients listed on the label are based on one serving of the food. Many foods contain more than one serving per package.  Check the total grams (g) of carbohydrates in one serving. You can calculate the number of servings of carbohydrates in one serving by dividing the total carbohydrates by 15. For example, if a food has 30 g of total carbohydrates, it would be equal to 2 servings of carbohydrates.  Check the number of grams (g) of saturated and trans fats in one serving. Choose foods that have low or no amount of these fats.  Check the number of milligrams (mg) of sodium in one serving. Most people   should limit total sodium intake to less than 2,300 mg per day.  Always check the nutrition information of foods labeled as "low-fat" or "nonfat". These foods may be higher in added sugar or refined carbohydrates and should be avoided.  Talk to your dietitian to identify your daily goals for nutrients listed on the label. Shopping  Avoid buying canned, premade, or processed foods. These  foods tend to be high in fat, sodium, and added sugar.  Shop around the outside edge of the grocery store. This includes fresh fruits and vegetables, bulk grains, fresh meats, and fresh dairy. Cooking  Use low-heat cooking methods, such as baking, instead of high-heat cooking methods like deep frying.  Cook using healthy oils, such as olive, canola, or sunflower oil.  Avoid cooking with butter, cream, or high-fat meats. Meal planning  Eat meals and snacks regularly, preferably at the same times every day. Avoid going long periods of time without eating.  Eat foods high in fiber, such as fresh fruits, vegetables, beans, and whole grains. Talk to your dietitian about how many servings of carbohydrates you can eat at each meal.  Eat 4-6 ounces of lean protein each day, such as lean meat, chicken, fish, eggs, or tofu. 1 ounce is equal to 1 ounce of meat, chicken, or fish, 1 egg, or 1/4 cup of tofu.  Eat some foods each day that contain healthy fats, such as avocado, nuts, seeds, and fish. Lifestyle   Check your blood glucose regularly.  Exercise at least 30 minutes 5 or more days each week, or as told by your health care provider.  Take medicines as told by your health care provider.  Do not use any products that contain nicotine or tobacco, such as cigarettes and e-cigarettes. If you need help quitting, ask your health care provider.  Work with a counselor or diabetes educator to identify strategies to manage stress and any emotional and social challenges. What are some questions to ask my health care provider?  Do I need to meet with a diabetes educator?  Do I need to meet with a dietitian?  What number can I call if I have questions?  When are the best times to check my blood glucose? Where to find more information:  American Diabetes Association: diabetes.org/food-and-fitness/food  Academy of Nutrition and Dietetics:  www.eatright.org/resources/health/diseases-and-conditions/diabetes  National Institute of Diabetes and Digestive and Kidney Diseases (NIH): www.niddk.nih.gov/health-information/diabetes/overview/diet-eating-physical-activity Summary  A healthy meal plan will help you control your blood glucose and maintain a healthy lifestyle.  Working with a diet and nutrition specialist (dietitian) can help you make a meal plan that is best for you.  Keep in mind that carbohydrates and alcohol have immediate effects on your blood glucose levels. It is important to count carbohydrates and to use alcohol carefully. This information is not intended to replace advice given to you by your health care provider. Make sure you discuss any questions you have with your health care provider. Document Released: 04/17/2005 Document Revised: 08/25/2016 Document Reviewed: 08/25/2016 Elsevier Interactive Patient Education  2018 Elsevier Inc.  

## 2018-07-15 NOTE — Progress Notes (Signed)
BP 133/73   Pulse 89   Temp 98.2 F (36.8 C) (Oral)   Wt 152 lb 9.6 oz (69.2 kg)   LMP  (LMP Unknown)   SpO2 96%   BMI 31.35 kg/m    Subjective:    [name], female    DOB: 02/17/1951, 67 y.o.   MRN: [id number]  HPI: Jordan Chan is a 67 y.o. female presents for 6 month f/u  Chief Complaint  Patient presents with  . Depression  . Diabetes  . Hyperlipidemia  . Hypertension   HYPERTENSION / [place] Satisfied with current treatment? yes Duration of hypertension: chronic BP monitoring frequency: not checking BP medication side effects: no Duration of hyperlipidemia: chronic Cholesterol medication side effects: no Cholesterol supplements: none Medication compliance: excellent compliance Aspirin: yes Recent stressors: no Recurrent headaches: no Visual changes: no Palpitations: no Dyspnea: no Chest pain: no Lower extremity edema: no Dizzy/lightheaded: no   DIABETES Previous A1C in September 7.4%, which was down from prior one of 8.1%. Hypoglycemic episodes: one episode over past few months of BS 63 where she had symptoms, she had not ate since lunch.  Has glucose tablets at home.   Polydipsia/polyuria: no Visual disturbance: no Chest pain: no Paresthesias: no Glucose Monitoring: yes  Accucheck frequency: Daily  Fasting glucose:  Post prandial:  Evening: 80 to 280  Before meals: Taking Insulin?: yes  Long acting insulin: 42 units in evening  Short acting insulin: Blood Pressure Monitoring: not checking Retinal Examination: Up to Date Foot Exam: Up to Date Pneumovax: Up to Date Influenza: Up to Date Aspirin: yes   DEPRESSION Mood status: stable Satisfied with current treatment?: yes Symptom severity: mild  Duration of current treatment : chronic Side effects: no Medication compliance: excellent compliance Psychotherapy/counseling: none Depressed mood: no Anxious mood: no Anhedonia: no Significant weight loss or  gain: no Insomnia: yes hard to fall asleep Fatigue: no Feelings of worthlessness or guilt: no Impaired concentration/indecisiveness: no Suicidal ideations: no Hopelessness: no Crying spells: no Depression screen [facility] 2/9 07/15/2018 02/05/2018 12/23/2017 12/04/2017 07/22/2017  Decreased Interest 0 0 0 0 1  Down, Depressed, Hopeless 0 1 1 1 1   PHQ - 2 Score 0 1 1 1 2   Altered sleeping 3 2 2 2 3   Tired, decreased energy 3 2 2 2 3   Change in appetite 0 0 0 0 0  Feeling bad or failure about yourself  0 0 0 0 0  Trouble concentrating 0 1 1 0 1  Moving slowly or fidgety/restless 0 0 0 0 0  Suicidal thoughts 0 0 0 0 0  PHQ-9 Score 6 6 6 5 9   Difficult doing work/chores Not difficult at all - - Not difficult at all -  Some recent data might be hidden    Relevant past medical, surgical, family and social history reviewed and updated as indicated. Interim medical history since our last visit reviewed. Allergies and medications reviewed and updated.  Review of Systems  Constitutional: Negative for activity change, appetite change, fatigue and fever.  Respiratory: Negative for cough, chest tightness and shortness of breath.   Cardiovascular: Negative for chest pain, palpitations and leg swelling.  Gastrointestinal: Negative for abdominal distention, abdominal pain, constipation, diarrhea, nausea and vomiting.  Endocrine: Negative.   Neurological: Negative for dizziness, syncope, weakness, light-headedness, numbness and headaches.  Psychiatric/Behavioral: Negative.     Per HPI unless specifically indicated above     Objective:    BP 133/73   Pulse 89  Temp 98.2 F (36.8 C) (Oral)   Wt 152 lb 9.6 oz (69.2 kg)   LMP  (LMP Unknown)   SpO2 96%   BMI 31.35 kg/m   Wt Readings from Last 3 Encounters:  07/15/18 152 lb 9.6 oz (69.2 kg)  04/14/18 151 lb 8 oz (68.7 kg)  02/05/18 148 lb 9.6 oz (67.4 kg)    Physical Exam Vitals signs and nursing note reviewed.  Constitutional:       Appearance: She is well-developed.  HENT:     Head: Normocephalic.  Eyes:     General:        Right eye: No discharge.        Left eye: No discharge.     Conjunctiva/sclera: Conjunctivae normal.     Pupils: Pupils are equal, round, and reactive to light.  Neck:     Musculoskeletal: Normal range of motion and neck supple.     Thyroid: No thyromegaly.     Vascular: No carotid bruit or JVD.  Cardiovascular:     Rate and Rhythm: Normal rate and regular rhythm.     Heart sounds: Normal heart sounds.  Pulmonary:     Effort: Pulmonary effort is normal.     Breath sounds: Normal breath sounds.  Abdominal:     General: Bowel sounds are normal.     Palpations: Abdomen is soft.  Lymphadenopathy:     Cervical: No cervical adenopathy.  Skin:    General: Skin is warm and dry.  Neurological:     Mental Status: She is alert and oriented to person, place, and time.  Psychiatric:        Mood and Affect: Mood normal.        Behavior: Behavior normal.        Thought Content: Thought content normal.        Judgment: Judgment normal.     Results for orders placed or performed in visit on 41/32/44  Basic metabolic panel  Result Value Ref Range   Glucose 190 (H) 65 - 99 mg/dL   BUN 10 8 - 27 mg/dL   Creatinine, Ser 0.74 0.57 - 1.00 mg/dL   GFR calc non Af Amer 84 >59 mL/min/1.73   GFR calc Af Amer 96 >59 mL/min/1.73   BUN/Creatinine Ratio 14 12 - 28   Sodium 141 134 - 144 mmol/L   Potassium 5.1 3.5 - 5.2 mmol/L   Chloride 99 96 - 106 mmol/L   CO2 27 20 - 29 mmol/L   Calcium 9.6 8.7 - 10.3 mg/dL      Assessment & Plan:   Problem List Items Addressed This Visit      Cardiovascular and Mediastinum   Benign hypertension with chronic kidney disease    Chronic, stable.  Continue current regimen.      Relevant Orders   Comprehensive metabolic panel   CBC with Differential/Platelet     Endocrine   Uncontrolled type 2 diabetes mellitus with chronic kidney disease ([place]) - Primary     Chronic, ongoing.  Previous A1C 7.4% and today 7.5%, due to occasional episodes of BS <70 will maintain current regimen to avoid tight control.  Return in 3 months for recheck.      Relevant Orders   Bayer DCA Hb A1c Waived     Other   Depression, major, single episode, in partial remission (HCC)    Chronic, stable.  Continue current regimen.      Hyperlipidemia    Chronic, stable.  Continue current  regimen.  CMP and lipid today.      Relevant Orders   Comprehensive metabolic panel   Lipid Panel w/o Chol/HDL Ratio   B12 deficiency    Injection today.  B12 level drawn.  Takes daily Metformin.      Relevant Orders   Vitamin B12       Follow up plan: Return in about 3 months (around 10/14/2018) for T2DM, HTN/HLD.

## 2018-07-16 LAB — CBC WITH DIFFERENTIAL/PLATELET
Basophils Absolute: 0.1 10*3/uL (ref 0.0–0.2)
Basos: 1 %
EOS (ABSOLUTE): 0.3 10*3/uL (ref 0.0–0.4)
Eos: 3 %
Hematocrit: 35.6 % (ref 34.0–46.6)
Hemoglobin: 11.1 g/dL (ref 11.1–15.9)
Immature Grans (Abs): 0 10*3/uL (ref 0.0–0.1)
Immature Granulocytes: 0 %
Lymphocytes Absolute: 2.5 10*3/uL (ref 0.7–3.1)
Lymphs: 24 %
MCH: 25.9 pg — ABNORMAL LOW (ref 26.6–33.0)
MCHC: 31.2 g/dL — ABNORMAL LOW (ref 31.5–35.7)
MCV: 83 fL (ref 79–97)
Monocytes Absolute: 0.9 10*3/uL (ref 0.1–0.9)
Monocytes: 8 %
Neutrophils Absolute: 6.6 10*3/uL (ref 1.4–7.0)
Neutrophils: 64 %
Platelets: 463 10*3/uL — ABNORMAL HIGH (ref 150–450)
RBC: 4.28 x10E6/uL (ref 3.77–5.28)
RDW: 14.9 % (ref 12.3–15.4)
WBC: 10.4 10*3/uL (ref 3.4–10.8)

## 2018-07-16 LAB — COMPREHENSIVE METABOLIC PANEL
ALT: 7 IU/L (ref 0–32)
AST: 14 IU/L (ref 0–40)
Albumin/Globulin Ratio: 1.3 (ref 1.2–2.2)
Albumin: 4.2 g/dL (ref 3.6–4.8)
Alkaline Phosphatase: 62 IU/L (ref 39–117)
BUN/Creatinine Ratio: 14 (ref 12–28)
BUN: 9 mg/dL (ref 8–27)
Bilirubin Total: 0.3 mg/dL (ref 0.0–1.2)
CO2: 27 mmol/L (ref 20–29)
Chloride: 97 mmol/L (ref 96–106)
Creatinine, Ser: 0.64 mg/dL (ref 0.57–1.00)
Globulin, Total: 3.2 g/dL (ref 1.5–4.5)
Glucose: 111 mg/dL — ABNORMAL HIGH (ref 65–99)
Sodium: 142 mmol/L (ref 134–144)
Total Protein: 7.4 g/dL (ref 6.0–8.5)

## 2018-07-16 LAB — COMPREHENSIVE METABOLIC PANEL WITH GFR
Calcium: 9.4 mg/dL (ref 8.7–10.3)
GFR calc Af Amer: 106 mL/min/{1.73_m2} (ref >59)
GFR calc non Af Amer: 92 mL/min/{1.73_m2} (ref >59)
Potassium: 5 mmol/L (ref 3.5–5.2)

## 2018-07-16 LAB — LIPID PANEL W/O CHOL/HDL RATIO
Cholesterol, Total: 132 mg/dL (ref 100–199)
HDL: 72 mg/dL (ref >39)
LDL Calculated: 46 mg/dL (ref 0–99)
Triglycerides: 69 mg/dL (ref 0–149)
VLDL Cholesterol Cal: 14 mg/dL (ref 5–40)

## 2018-07-16 LAB — VITAMIN B12: Vitamin B-12: >2000 pg/mL — ABNORMAL HIGH (ref 232–1245)

## 2018-07-23 ENCOUNTER — Other Ambulatory Visit: Payer: Self-pay | Admitting: Unknown Physician Specialty

## 2018-07-26 ENCOUNTER — Other Ambulatory Visit: Payer: Self-pay | Admitting: Unknown Physician Specialty

## 2018-08-03 ENCOUNTER — Other Ambulatory Visit: Payer: Self-pay | Admitting: Unknown Physician Specialty

## 2018-08-04 DIAGNOSIS — Z9841 Cataract extraction status, right eye: Secondary | ICD-10-CM

## 2018-08-04 HISTORY — DX: Cataract extraction status, left eye: Z98.41

## 2018-08-05 DIAGNOSIS — J449 Chronic obstructive pulmonary disease, unspecified: Secondary | ICD-10-CM | POA: Diagnosis not present

## 2018-08-30 ENCOUNTER — Other Ambulatory Visit: Payer: Self-pay | Admitting: Family Medicine

## 2018-08-30 ENCOUNTER — Other Ambulatory Visit: Payer: Self-pay | Admitting: Nurse Practitioner

## 2018-08-30 NOTE — Telephone Encounter (Signed)
Copied from [place] [id number]. Topic: Quick Communication - Rx Refill/Question >> Aug 30, 2018  1:14 PM Waldemar Dickens, Sade R wrote: Medication: gabapentin (NEURONTIN) 800 MG tablet , ADVAIR DISKUS 250-50 MCG/DOSE AEPB ( pt would like a year supply)  Has the patient contacted their pharmacy? Yes  Preferred Pharmacy (with phone number or street name): [place], [place] [phone] (Phone) [phone] (Fax)    Agent: Please be advised that RX refills may take up to 3 business days. We ask that you follow-up with your pharmacy.

## 2018-08-31 MED ORDER — GABAPENTIN 800 MG PO TABS: 800.0000 mg | ORAL_TABLET | Freq: Three times a day (TID) | ORAL | 3 refills | Status: DC

## 2018-08-31 MED ORDER — FLUTICASONE-SALMETEROL 250-50 MCG/DOSE IN AEPB: 1.0000 | INHALATION_SPRAY | Freq: Two times a day (BID) | RESPIRATORY_TRACT | 3 refills | Status: DC

## 2018-08-31 NOTE — Telephone Encounter (Signed)
Requested Prescriptions  Pending Prescriptions Disp Refills  . gabapentin (NEURONTIN) 800 MG tablet 270 tablet 3    Sig: Take 1 tablet (800 mg total) by mouth 3 (three) times daily.     Neurology: Anticonvulsants - gabapentin Passed - 08/30/2018  1:26 PM      Passed - Valid encounter within last 12 months    Recent Outpatient Visits          1 month ago Uncontrolled type 2 diabetes mellitus with chronic kidney disease ([place])   [place] [name], Jolene T, NP   4 months ago Uncontrolled type 2 diabetes mellitus with chronic kidney disease ([place])   [place], Megan P, DO   6 months ago Uncontrolled type 2 diabetes mellitus with chronic kidney disease ([employer])   [place] Kathrine Haddock, NP   8 months ago Burning with urination   [place], NP   8 months ago Acute cystitis with hematuria   [place], [place], DO      Future Appointments            In 1 month [name], [name], NP [facility]   In 3 months  [facility]         . Fluticasone-Salmeterol (ADVAIR DISKUS) 250-50 MCG/DOSE AEPB 180 each 3    Sig: Inhale 1 puff into the lungs 2 (two) times daily.     Pulmonology:  Combination Products Passed - 08/30/2018  1:26 PM      Passed - Valid encounter within last 12 months    Recent Outpatient Visits          1 month ago Uncontrolled type 2 diabetes mellitus with chronic kidney disease ([place])   [place] [name], Jolene T, NP   4 months ago Uncontrolled type 2 diabetes mellitus with chronic kidney disease ([place])   [place], Megan P, DO   6 months ago Uncontrolled type 2 diabetes mellitus with chronic kidney disease ([place])   [place] Kathrine Haddock, NP   8 months ago Burning with urination   [place], NP   8 months ago Acute cystitis with hematuria   [place], [place], DO      Future Appointments            In 1 month [name], [name], NP [facility]   In 3 months  [facility]

## 2018-08-31 NOTE — Telephone Encounter (Signed)
Requested Prescriptions  Pending Prescriptions Disp Refills  . metoprolol succinate (TOPROL-XL) 25 MG 24 hr tablet [Pharmacy Med Name: METOPROLOL SUCCINATE ER 25 MG Tablet Extended Release 24 Hour] 45 tablet 1    Sig: TAKE 1/2 TABLET EVERY DAY     Cardiovascular:  Beta Blockers Passed - 08/30/2018  5:03 PM      Passed - Last BP in normal range    BP Readings from Last 1 Encounters:  07/15/18 133/73         Passed - Last Heart Rate in normal range    Pulse Readings from Last 1 Encounters:  07/15/18 89         Passed - Valid encounter within last 6 months    Recent Outpatient Visits          1 month ago Uncontrolled type 2 diabetes mellitus with chronic kidney disease ([place])   [place] [name], Jolene T, NP   4 months ago Uncontrolled type 2 diabetes mellitus with chronic kidney disease ([place])   [place], Megan P, DO   6 months ago Uncontrolled type 2 diabetes mellitus with chronic kidney disease ([place])   [place] Kathrine Haddock, NP   8 months ago Burning with urination   [facility] Kathrine Haddock, NP   8 months ago Acute cystitis with hematuria   [place], [place], DO      Future Appointments            In 1 month [name], [name], NP [facility]   In 3 months  [facility]         . JANUVIA 100 MG tablet [Pharmacy Med Name: JANUVIA 100 MG Tablet] 90 tablet 1    Sig: TAKE 1 TABLET EVERY DAY     Endocrinology:  Diabetes - DPP-4 Inhibitors Passed - 08/30/2018  5:03 PM      Passed - HBA1C is between 0 and 7.9 and within 180 days    Hemoglobin A1C  Date Value Ref Range Status  04/28/2016 7.7%  Final   HB A1C (BAYER DCA - WAIVED)  Date Value Ref Range Status  07/15/2018 7.5 (H) <7.0 % Final    Comment:                                          Diabetic Adult            <7.0                                       Healthy Adult        4.3 - 5.7                                                            (DCCT/NGSP) American Diabetes Association's Summary of Glycemic Recommendations for Adults with Diabetes: Hemoglobin A1c <7.0%. More stringent glycemic goals (A1c <6.0%) may further reduce complications at the cost of increased risk of hypoglycemia.          Passed - Cr in normal range and  within 360 days    Creatinine  Date Value Ref Range Status  09/14/2013 0.68 0.60 - 1.30 mg/dL Final   Creatinine, Ser  Date Value Ref Range Status  07/15/2018 0.64 0.57 - 1.00 mg/dL Final         Passed - Valid encounter within last 6 months    Recent Outpatient Visits          1 month ago Uncontrolled type 2 diabetes mellitus with chronic kidney disease ([place])   [place], Jolene T, NP   4 months ago Uncontrolled type 2 diabetes mellitus with chronic kidney disease ([place])   [place], Megan P, DO   6 months ago Uncontrolled type 2 diabetes mellitus with chronic kidney disease ([place])   [place] Kathrine Haddock, NP   8 months ago Burning with urination   [place], NP   8 months ago Acute cystitis with hematuria   [place], [place], DO      Future Appointments            In 1 month [name], [name], NP [facility]   In 3 months  [facility]         . lisinopril (PRINIVIL,ZESTRIL) 5 MG tablet [Pharmacy Med Name: LISINOPRIL 5 MG Tablet] 90 tablet 1    Sig: TAKE 1 TABLET (5 MG TOTAL) BY MOUTH DAILY.     Cardiovascular:  ACE Inhibitors Passed - 08/30/2018  5:03 PM      Passed - Cr in normal range and within 180 days    Creatinine  Date Value Ref Range Status  09/14/2013 0.68 0.60 - 1.30 mg/dL Final   Creatinine, Ser  Date Value Ref Range Status  07/15/2018 0.64 0.57 - 1.00 mg/dL Final         Passed - K in normal range and within 180 days    Potassium  Date Value Ref Range Status   07/15/2018 5.0 3.5 - 5.2 mmol/L Final  09/14/2013 3.5 3.5 - 5.1 mmol/L Final         Passed - Patient is not pregnant      Passed - Last BP in normal range    BP Readings from Last 1 Encounters:  07/15/18 133/73         Passed - Valid encounter within last 6 months    Recent Outpatient Visits          1 month ago Uncontrolled type 2 diabetes mellitus with chronic kidney disease ([place])   [place], Jolene T, NP   4 months ago Uncontrolled type 2 diabetes mellitus with chronic kidney disease ([place])   [place], Megan P, DO   6 months ago Uncontrolled type 2 diabetes mellitus with chronic kidney disease ([place])   [place] Kathrine Haddock, NP   8 months ago Burning with urination   [facility] Kathrine Haddock, NP   8 months ago Acute cystitis with hematuria   [place], [place], DO      Future Appointments            In 1 month [name], [name], NP [facility]   In 3 months  [facility]

## 2018-09-02 DIAGNOSIS — M792 Neuralgia and neuritis, unspecified: Secondary | ICD-10-CM | POA: Diagnosis not present

## 2018-09-02 DIAGNOSIS — G894 Chronic pain syndrome: Secondary | ICD-10-CM | POA: Diagnosis not present

## 2018-09-02 DIAGNOSIS — G893 Neoplasm related pain (acute) (chronic): Secondary | ICD-10-CM | POA: Diagnosis not present

## 2018-09-02 DIAGNOSIS — M5442 Lumbago with sciatica, left side: Secondary | ICD-10-CM | POA: Diagnosis not present

## 2018-09-05 DIAGNOSIS — J449 Chronic obstructive pulmonary disease, unspecified: Secondary | ICD-10-CM | POA: Diagnosis not present

## 2018-10-01 ENCOUNTER — Telehealth: Payer: Self-pay | Admitting: Nurse Practitioner

## 2018-10-01 NOTE — Telephone Encounter (Signed)
Copied from [place] [phone]. Topic: Appointment Scheduling - Scheduling Inquiry for Clinic >> Oct 01, 2018  9:47 AM Ahmed Prima L wrote: Reason for CRM: Patient is calling to see if she can come in and have her B12, she said she is suppose to get them every month. Please Advise. >> Oct 01, 2018  9:50 AM Linard Millers E wrote: Please advise thanks

## 2018-10-01 NOTE — Telephone Encounter (Signed)
Called and spoke to patient. Message relayed. Pt stated she will come to get B12 injection next Monday morning.

## 2018-10-04 ENCOUNTER — Ambulatory Visit (INDEPENDENT_AMBULATORY_CARE_PROVIDER_SITE_OTHER): Payer: Medicare HMO

## 2018-10-04 ENCOUNTER — Other Ambulatory Visit: Payer: Self-pay | Admitting: Nurse Practitioner

## 2018-10-04 DIAGNOSIS — E538 Deficiency of other specified B group vitamins: Secondary | ICD-10-CM | POA: Diagnosis not present

## 2018-10-04 DIAGNOSIS — J449 Chronic obstructive pulmonary disease, unspecified: Secondary | ICD-10-CM | POA: Diagnosis not present

## 2018-10-04 MED ORDER — CYANOCOBALAMIN 1000 MCG/ML IJ SOLN
1000.0000 ug | Freq: Once | INTRAMUSCULAR | Status: AC
  Administered 2018-10-04: 1000 ug via INTRAMUSCULAR

## 2018-10-04 NOTE — Progress Notes (Signed)
B12 injection ordered

## 2018-10-05 NOTE — Telephone Encounter (Signed)
Requested Prescriptions  Pending Prescriptions Disp Refills  . atorvastatin (LIPITOR) 20 MG tablet [Pharmacy Med Name: ATORVASTATIN CALCIUM 20 MG Tablet] 90 tablet 0    Sig: TAKE 1 TABLET EVERY DAY     Cardiovascular:  Antilipid - Statins Passed - 10/04/2018  3:03 PM      Passed - Total Cholesterol in normal range and within 360 days    Cholesterol, Total  Date Value Ref Range Status  07/15/2018 132 100 - 199 mg/dL Final   Cholesterol [place], Waived  Date Value Ref Range Status  10/01/2015 132 <200 mg/dL Final    Comment:                            Desirable                <200                         Borderline High      200- 239                         High                     >239          Passed - LDL in normal range and within 360 days    LDL Calculated  Date Value Ref Range Status  07/15/2018 46 0 - 99 mg/dL Final         Passed - HDL in normal range and within 360 days    HDL  Date Value Ref Range Status  07/15/2018 72 >39 mg/dL Final         Passed - Triglycerides in normal range and within 360 days    Triglycerides  Date Value Ref Range Status  07/15/2018 69 0 - 149 mg/dL Final   Triglycerides Piccolo,Waived  Date Value Ref Range Status  10/01/2015 79 <150 mg/dL Final    Comment:                            Normal                   <150                         Borderline High     150 - 199                         High                200 - 499                         Very High                >499          Passed - Patient is not pregnant      Passed - Valid encounter within last 12 months    Recent Outpatient Visits          2 months ago Uncontrolled type 2 diabetes mellitus with chronic kidney disease ([place])   [place], Jolene T, NP   5 months ago Uncontrolled type 2 diabetes mellitus with chronic kidney disease (  [place])   [place], Humphreys P, DO   8 months ago Uncontrolled type 2 diabetes mellitus with  chronic kidney disease ([place])   [place] Kathrine Haddock, NP   9 months ago Burning with urination   [place], NP   9 months ago Acute cystitis with hematuria   [place], Barb [place], DO      Future Appointments            In 1 week [name], [name], NP [facility]   In 2 months  [facility]

## 2018-10-06 ENCOUNTER — Other Ambulatory Visit: Payer: Self-pay

## 2018-10-06 MED ORDER — DICLOFENAC SODIUM 50 MG PO TBEC: 50.0000 mg | DELAYED_RELEASE_TABLET | Freq: Three times a day (TID) | ORAL | 0 refills | Status: DC

## 2018-10-06 NOTE — Telephone Encounter (Signed)
Patient last seen 07/15/18 and has a f/up 10/14/18.

## 2018-10-13 ENCOUNTER — Encounter: Payer: Self-pay | Admitting: Nurse Practitioner

## 2018-10-14 ENCOUNTER — Other Ambulatory Visit: Payer: Self-pay | Admitting: Nurse Practitioner

## 2018-10-14 ENCOUNTER — Ambulatory Visit (INDEPENDENT_AMBULATORY_CARE_PROVIDER_SITE_OTHER): Payer: Medicare HMO | Admitting: Nurse Practitioner

## 2018-10-14 ENCOUNTER — Other Ambulatory Visit: Payer: Self-pay | Admitting: Unknown Physician Specialty

## 2018-10-14 ENCOUNTER — Encounter: Payer: Self-pay | Admitting: Nurse Practitioner

## 2018-10-14 ENCOUNTER — Other Ambulatory Visit: Payer: Self-pay

## 2018-10-14 VITALS — BP 133/82 | HR 98 | Temp 98.1°F | Wt 153.0 lb

## 2018-10-14 DIAGNOSIS — E1140 Type 2 diabetes mellitus with diabetic neuropathy, unspecified: Secondary | ICD-10-CM | POA: Diagnosis not present

## 2018-10-14 DIAGNOSIS — I10 Essential (primary) hypertension: Secondary | ICD-10-CM

## 2018-10-14 DIAGNOSIS — Z794 Long term (current) use of insulin: Secondary | ICD-10-CM

## 2018-10-14 DIAGNOSIS — E1169 Type 2 diabetes mellitus with other specified complication: Secondary | ICD-10-CM

## 2018-10-14 DIAGNOSIS — E785 Hyperlipidemia, unspecified: Secondary | ICD-10-CM

## 2018-10-14 LAB — BAYER DCA HB A1C WAIVED: HB A1C (BAYER DCA - WAIVED): 8.2 % — ABNORMAL HIGH (ref <7.0)

## 2018-10-14 NOTE — Assessment & Plan Note (Signed)
Chronic, ongoing.  Continue current medication regimen.  Lipid panel next visit. 

## 2018-10-14 NOTE — Patient Instructions (Signed)
Diabetes Mellitus and Nutrition, Adult  When you have diabetes (diabetes mellitus), it is very important to have healthy eating habits because your blood sugar (glucose) levels are greatly affected by what you eat and drink. Eating healthy foods in the appropriate amounts, at about the same times every day, can help you:  · Control your blood glucose.  · Lower your risk of heart disease.  · Improve your blood pressure.  · Reach or maintain a healthy weight.  Every person with diabetes is different, and each person has different needs for a meal plan. Your health care provider may recommend that you work with a diet and nutrition specialist (dietitian) to make a meal plan that is best for you. Your meal plan may vary depending on factors such as:  · The calories you need.  · The medicines you take.  · Your weight.  · Your blood glucose, blood pressure, and cholesterol levels.  · Your activity level.  · Other health conditions you have, such as heart or kidney disease.  How do carbohydrates affect me?  Carbohydrates, also called carbs, affect your blood glucose level more than any other type of food. Eating carbs naturally raises the amount of glucose in your blood. Carb counting is a method for keeping track of how many carbs you eat. Counting carbs is important to keep your blood glucose at a healthy level, especially if you use insulin or take certain oral diabetes medicines.  It is important to know how many carbs you can safely have in each meal. This is different for every person. Your dietitian can help you calculate how many carbs you should have at each meal and for each snack.  Foods that contain carbs include:  · Bread, cereal, rice, pasta, and crackers.  · Potatoes and corn.  · Peas, beans, and lentils.  · Milk and yogurt.  · Fruit and juice.  · Desserts, such as cakes, cookies, ice cream, and candy.  How does alcohol affect me?  Alcohol can cause a sudden decrease in blood glucose (hypoglycemia),  especially if you use insulin or take certain oral diabetes medicines. Hypoglycemia can be a life-threatening condition. Symptoms of hypoglycemia (sleepiness, dizziness, and confusion) are similar to symptoms of having too much alcohol.  If your health care provider says that alcohol is safe for you, follow these guidelines:  · Limit alcohol intake to no more than 1 drink per day for nonpregnant women and 2 drinks per day for men. One drink equals 12 oz of beer, 5 oz of wine, or 1½ oz of hard liquor.  · Do not drink on an empty stomach.  · Keep yourself hydrated with water, diet soda, or unsweetened iced tea.  · Keep in mind that regular soda, juice, and other mixers may contain a lot of sugar and must be counted as carbs.  What are tips for following this plan?    Reading food labels  · Start by checking the serving size on the "Nutrition Facts" label of packaged foods and drinks. The amount of calories, carbs, fats, and other nutrients listed on the label is based on one serving of the item. Many items contain more than one serving per package.  · Check the total grams (g) of carbs in one serving. You can calculate the number of servings of carbs in one serving by dividing the total carbs by 15. For example, if a food has 30 g of total carbs, it would be equal to 2   servings of carbs.  · Check the number of grams (g) of saturated and trans fats in one serving. Choose foods that have low or no amount of these fats.  · Check the number of milligrams (mg) of salt (sodium) in one serving. Most people should limit total sodium intake to less than 2,300 mg per day.  · Always check the nutrition information of foods labeled as "low-fat" or "nonfat". These foods may be higher in added sugar or refined carbs and should be avoided.  · Talk to your dietitian to identify your daily goals for nutrients listed on the label.  Shopping  · Avoid buying canned, premade, or processed foods. These foods tend to be high in fat, sodium,  and added sugar.  · Shop around the outside edge of the grocery store. This includes fresh fruits and vegetables, bulk grains, fresh meats, and fresh dairy.  Cooking  · Use low-heat cooking methods, such as baking, instead of high-heat cooking methods like deep frying.  · Cook using healthy oils, such as olive, canola, or sunflower oil.  · Avoid cooking with butter, cream, or high-fat meats.  Meal planning  · Eat meals and snacks regularly, preferably at the same times every day. Avoid going long periods of time without eating.  · Eat foods high in fiber, such as fresh fruits, vegetables, beans, and whole grains. Talk to your dietitian about how many servings of carbs you can eat at each meal.  · Eat 4-6 ounces (oz) of lean protein each day, such as lean meat, chicken, fish, eggs, or tofu. One oz of lean protein is equal to:  ? 1 oz of meat, chicken, or fish.  ? 1 egg.  ? ¼ cup of tofu.  · Eat some foods each day that contain healthy fats, such as avocado, nuts, seeds, and fish.  Lifestyle  · Check your blood glucose regularly.  · Exercise regularly as told by your health care provider. This may include:  ? 150 minutes of moderate-intensity or vigorous-intensity exercise each week. This could be brisk walking, biking, or water aerobics.  ? Stretching and doing strength exercises, such as yoga or weightlifting, at least 2 times a week.  · Take medicines as told by your health care provider.  · Do not use any products that contain nicotine or tobacco, such as cigarettes and e-cigarettes. If you need help quitting, ask your health care provider.  · Work with a counselor or diabetes educator to identify strategies to manage stress and any emotional and social challenges.  Questions to ask a health care provider  · Do I need to meet with a diabetes educator?  · Do I need to meet with a dietitian?  · What number can I call if I have questions?  · When are the best times to check my blood glucose?  Where to find more  information:  · American Diabetes Association: diabetes.org  · Academy of Nutrition and Dietetics: www.eatright.org  · National Institute of Diabetes and Digestive and Kidney Diseases (NIH): www.niddk.nih.gov  Summary  · A healthy meal plan will help you control your blood glucose and maintain a healthy lifestyle.  · Working with a diet and nutrition specialist (dietitian) can help you make a meal plan that is best for you.  · Keep in mind that carbohydrates (carbs) and alcohol have immediate effects on your blood glucose levels. It is important to count carbs and to use alcohol carefully.  This information is not intended to   replace advice given to you by your health care provider. Make sure you discuss any questions you have with your health care provider.  Document Released: 04/17/2005 Document Revised: 02/18/2017 Document Reviewed: 08/25/2016  Elsevier Interactive Patient Education © 2019 [employer].

## 2018-10-14 NOTE — Assessment & Plan Note (Signed)
Chronic, ongoing.  Continue current medication regimen.  BP below goal today.
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
## 2018-10-14 NOTE — Assessment & Plan Note (Signed)
Chronic, ongoing.  A1C today 8.2%.  Due to patient history of hypoglycemia with insulin changes and one episode <60 in past three months will increase Levemir cautiously to 44 units nightly.  Have discussed with patient and she is aware if increased episodes of BS <70, then return to 42 units.  CCM referral to pharmacist for further guidance.  Consider SGLT and possible reduction of insulin.

## 2018-10-20 ENCOUNTER — Ambulatory Visit (INDEPENDENT_AMBULATORY_CARE_PROVIDER_SITE_OTHER): Payer: Medicare HMO | Admitting: Pharmacist

## 2018-10-20 ENCOUNTER — Other Ambulatory Visit: Payer: Self-pay | Admitting: Nurse Practitioner

## 2018-10-20 ENCOUNTER — Other Ambulatory Visit: Payer: Self-pay

## 2018-10-20 DIAGNOSIS — Z794 Long term (current) use of insulin: Secondary | ICD-10-CM

## 2018-10-20 DIAGNOSIS — E1140 Type 2 diabetes mellitus with diabetic neuropathy, unspecified: Secondary | ICD-10-CM

## 2018-10-20 MED ORDER — INSULIN DEGLUDEC 100 UNIT/ML ~~LOC~~ SOPN: 30.0000 [IU] | PEN_INJECTOR | Freq: Every day | SUBCUTANEOUS | 2 refills | Status: DC

## 2018-10-20 MED ORDER — ACCU-CHEK SOFTCLIX LANCETS MISC: 2 refills | Status: DC

## 2018-10-20 MED ORDER — DULAGLUTIDE 0.75 MG/0.5ML ~~LOC~~ SOAJ: 0.7500 mg | SUBCUTANEOUS | 2 refills | Status: DC

## 2018-10-20 MED ORDER — GLUCOSE BLOOD VI STRP: ORAL_STRIP | 2 refills | Status: DC

## 2018-10-20 NOTE — Chronic Care Management (AMB) (Signed)
Chronic Care Management   Note  10/20/2018 Name: Jordan Chan MRN: [id number] DOB: Jun 01, 1951   Subjective:  Patient is a 68 year old female followed by [name], NP for primary care services, referred to chronic care management for support with diabetes, HTN, HLD, COPD.   Spoke with patient telephonically today.  Jordan Chan was given information about Chronic Care Management services today including:  1. CCM service includes personalized support from designated clinical staff supervised by her physician, including individualized plan of care and coordination with other care providers 2. 24/7 contact phone numbers for assistance for urgent and routine care needs. 3. Service will only be billed when office clinical staff spend 20 minutes or more in a month to coordinate care. 4. Only one practitioner may furnish and bill the service in a calendar month. 5. The patient may stop CCM services at any time (effective at the end of the month) by phone call to the office staff. 6. The patient will be responsible for cost sharing (co-pay) of up to 20% of the service fee (after annual deductible is met).  Patient agreed to services and verbal consent obtained.   Review of patient status, including review of consultants reports, laboratory and other test data, was performed as part of comprehensive evaluation and provision of chronic care management services.   Objective: Lab Results  Component Value Date   CREATININE 0.64 07/15/2018   CREATININE 0.74 05/03/2018   CREATININE 0.72 04/14/2018    Lab Results  Component Value Date   HGBA1C 7.5 (H) 07/15/2018    Lipid Panel     Component Value Date/Time   CHOL 132 07/15/2018 0923   CHOL 132 10/01/2015 1012   TRIG 69 07/15/2018 0923   TRIG 79 10/01/2015 1012   HDL 72 07/15/2018 0923   VLDL 16 10/01/2015 1012   LDLCALC 46 07/15/2018 0923    BP Readings from Last 3 Encounters:  10/14/18 133/82  07/15/18 133/73  04/14/18  123/73    Allergies  Allergen Reactions  . Other Palpitations    IV steroids  . Pain Patch [Menthol] Anaphylaxis  . Avelox [Moxifloxacin Hcl In Nacl] Other (See Comments)    Upset stomach  . Doxycycline Diarrhea  . Erythromycin Nausea Only and Other (See Comments)    Can take a Z-Pak just fine  . Fentanyl   . Oxycontin [Oxycodone] Hives  . Ozempic [Semaglutide] Nausea Only    Medications Reviewed Today    Reviewed by De Hollingshead, [facility] (Pharmacist) on 10/20/18 at 1110  Med List Status: <None>  Medication Order Taking? Sig Documenting Provider Last Dose Status Informant  ACCU-CHEK AVIVA PLUS test strip [id number] Yes TEST ONE TIME DAILY [name] T, NP Taking Active   ACCU-CHEK SOFTCLIX LANCETS lancets [id number] Yes TEST BLOOD SUGAR ONE TIME DAILY [name], Jolene T, NP Taking Active   albuterol (PROVENTIL HFA;VENTOLIN HFA) 108 (90 BASE) MCG/ACT inhaler [id number] No Inhale 2 puffs into the lungs every 6 (six) hours as needed for wheezing or shortness of breath. [provider] Not Taking Active Self  aspirin EC 81 MG tablet [id number] Yes Take 81 mg by mouth daily. [provider] Taking Active Self  atorvastatin (LIPITOR) 20 MG tablet [id number] Yes TAKE 1 TABLET EVERY DAY [name], Jolene T, NP Taking Active   cyclobenzaprine (FLEXERIL) 5 MG tablet [id number] Yes Take 5 mg by mouth 2 (two) times daily as needed. [provider] Taking Active Self  Med Note (TRAVIS, [place]   Wed Oct 20, 2018 11:06 AM) Taking 2-3 times/week for neck/back  diclofenac (VOLTAREN) 50 MG EC tablet [id number] Yes Take 1 tablet (50 mg total) by mouth 3 (three) times daily. [name] T, NP Taking Active   Fluticasone-Salmeterol (ADVAIR DISKUS) 250-50 MCG/DOSE AEPB [id number] Yes Inhale 1 puff into the lungs 2 (two) times daily. [name] T, NP Taking Active   furosemide (LASIX) 20 MG tablet [id number] No Take 1 tablet (20 mg total) by mouth daily as  needed.  Patient not taking:  Reported on 10/20/2018   End, Harrell Gave, MD Not Taking Active   gabapentin (NEURONTIN) 800 MG tablet [id number] Yes Take 1 tablet (800 mg total) by mouth 3 (three) times daily. [name] T, NP Taking Active   Insulin Detemir (LEVEMIR FLEXTOUCH) 100 UNIT/ML Pen [id number] Yes Inject 42 Units into the skin daily at 10 pm. Kathrine Haddock, NP Taking Active            Med Note De Hollingshead   Wed Oct 20, 2018 11:08 AM) 42 units  JANUVIA 100 MG tablet [id number] Yes TAKE 1 TABLET EVERY DAY [name], Jolene T, NP Taking Active   lisinopril (PRINIVIL,ZESTRIL) 5 MG tablet [id number] Yes TAKE 1 TABLET (5 MG TOTAL) BY MOUTH DAILY. [name] T, NP Taking Active   metFORMIN (GLUCOPHAGE) 1000 MG tablet [id number] Yes TAKE 1 TABLET TWICE DAILY WITH MEALS Kathrine Haddock, NP Taking Active   metoprolol succinate (TOPROL-XL) 25 MG 24 hr tablet [id number] Yes TAKE 1/2 TABLET EVERY DAY [name], NP Taking Active         Discontinued 10/20/18 1110 (Completed Course)   pantoprazole (PROTONIX) 40 MG tablet [id number] No TAKE 1 TABLET TWICE DAILY  Patient not taking:  Reported on 10/20/2018   Kathrine Haddock, NP Not Taking Active   tiotropium (SPIRIVA HANDIHALER) 18 MCG inhalation capsule [id number] Yes Place 18 mcg into inhaler and inhale daily. [provider] Taking Active Self  venlafaxine XR (EFFEXOR-XR) 150 MG 24 hr capsule [id number] Yes TAKE 1 CAPSULE EVERY DAY [name], Jolene T, NP Taking Active          Assessment:  Goals Addressed            This Visit's Progress     Patient Stated   . "I want to get my blood sugars under control" (pt-stated)       Current Barriers:  . Lack of optimized diabetes regimen o A1c 8.2% on 10/14/2018, worsened from 7.5% in 07/2019 o Levemir 42 units QHS - reports fasting blood sugars <70 a few mornings o Reports history of Ozempic, significant GI upset; hx of Trulicity, denies GI upset with this medication,  appears she was on 0.75 mg and never on 1.5 mg  . Dietary indiscretions - reports sweet tea from [employer] every day for lunch, along with fried okra and mashed potatoes; skips breakfast, typically has sandwich, salad, or bowl of soup with supper . Lack of knowledge regarding proper treatment of hypoglycemia;  reports treating lows with 1-2 glucose tablets, cutie oranges/tangerines, or chocolate candy;    Pharmacist Clinical Goal(s):  [name] Over the next 30 days, patient will work with PharmD to address needs related to optimizing medication regimen  Interventions: . Comprehensive medication review performed . Discussed pharmacokinetics of Levemir, explained that a true 24 hour basal insulin would likely provide greater blood sugar control with less risk of hypoglycemia. Patient amenable to changing to [place].  . Discussed  mechanism of action, benefits, and possible side effects of GLP1 vs SGLT2 therapy with patient. Patient elects to re-try Trulicity therapy.  Nash Dimmer with PCP [name], NP; discussed following recommendations which she will prescribe  o Start Trulicity 8.18 mg once weekly. Discontinue Januvia d/t duplicative mechanism  o Discontinue Levemir. Start Tresiba 30 units once daily (dose reduction d/t increased potency and concurrent Trulicity therapy) o Patient requested updated glucometer supplies to indicate to check up to TID   Patient Self Care Activities:  . Self administers medications as prescribed  . Patient CAN afford medications - patient has Medicare Extra Help; all generic copays are ~$4 for 90 day supplies and all brand copays are $9 for 90 day supplies . Patient will begin to check SMBG fasting and 2 hour post-prandial  . Patient will work on dietary modifications: o Patient will start getting 1/2 sweet 1/2 unsweet tea, instead of full sweet tea with lunch o Patient will focus on increasing protein intake in meals  Initial goal documentation         Plan - Patient will contact PharmD to let me know when she receives prescriptions from [place], PharmD Clinical Pharmacist [place] [phone]

## 2018-10-20 NOTE — Progress Notes (Signed)
Following changes/medications ordered per CCM pharmacist recommendations and discussion with patient:  - Start Trulicity 5.83 MG weekly and discontinue Januvia - Discontinue Levemir and change to [place] at 30 units daily (dose reduction due to potency and concurrent Trulicity therapy) - Increase in glucometer supplies to check BS TID

## 2018-10-20 NOTE — Patient Instructions (Signed)
Visit Information  Goals Addressed            This Visit's Progress     Patient Stated   . "I want to get my blood sugars under control" (pt-stated)       Current Barriers:  . Lack of optimized diabetes regimen o A1c 8.2% on 10/14/2018, worsened from 7.5% in 07/2019 o Levemir 42 units QHS - reports fasting blood sugars <70 a few mornings o Reports history of Ozempic, significant GI upset; hx of Trulicity, denies GI upset with this medication, appears she was on 0.75 mg and never on 1.5 mg  . Dietary indiscretions - reports sweet tea from [employer] every day for lunch, along with fried okra and mashed potatoes; skips breakfast, typically has sandwich, salad, or bowl of soup with supper . Lack of knowledge regarding proper treatment of hypoglycemia;  reports treating lows with 1-2 glucose tablets, cutie oranges/tangerines, or chocolate candy;    Pharmacist Clinical Goal(s):  [name] Over the next 30 days, patient will work with PharmD to address needs related to optimizing medication regimen  Interventions: . Comprehensive medication review performed . Discussed pharmacokinetics of Levemir, explained that a true 24 hour basal insulin would likely provide greater blood sugar control with less risk of hypoglycemia. Patient amenable to changing to [place].  . Discussed mechanism of action, benefits, and possible side effects of GLP1 vs SGLT2 therapy with patient. Patient elects to re-try Trulicity therapy.  Nash Dimmer with PCP [name], NP; discussed following recommendations which she will prescribe  o Start Trulicity 1.50 mg once weekly. Discontinue Januvia d/t duplicative mechanism  o Discontinue Levemir. Start Tresiba 30 units once daily (dose reduction d/t increased potency and concurrent Trulicity therapy) o Patient requested updated glucometer supplies to indicate to check up to TID   Patient Self Care Activities:  . Self administers medications as prescribed  . Patient CAN  afford medications - patient has Medicare Extra Help; all generic copays are ~$4 for 90 day supplies and all brand copays are $9 for 90 day supplies . Patient will begin to check SMBG fasting and 2 hour post-prandial  . Patient will work on dietary modifications: o Patient will start getting 1/2 sweet 1/2 unsweet tea, instead of full sweet tea with lunch o Patient will focus on increasing protein intake in meals  Initial goal documentation        Ms. Caldeira was given information about Chronic Care Management services today including:  1. CCM service includes personalized support from designated clinical staff supervised by her physician, including individualized plan of care and coordination with other care providers 2. 24/7 contact phone numbers for assistance for urgent and routine care needs. 3. Service will only be billed when office clinical staff spend 20 minutes or more in a month to coordinate care. 4. Only one practitioner may furnish and bill the service in a calendar month. 5. The patient may stop CCM services at any time (effective at the end of the month) by phone call to the office staff. 6. The patient will be responsible for cost sharing (co-pay) of up to 20% of the service fee (after annual deductible is met).  Patient agreed to services and verbal consent obtained.   The patient verbalized understanding of instructions provided today and declined a print copy of patient instruction materials.   Plan: Patient will contact PharmD when she receives medications from [place], PharmD Clinical Pharmacist [place]  [phone]

## 2018-11-04 DIAGNOSIS — J449 Chronic obstructive pulmonary disease, unspecified: Secondary | ICD-10-CM | POA: Diagnosis not present

## 2018-11-10 ENCOUNTER — Other Ambulatory Visit: Payer: Self-pay

## 2018-11-10 ENCOUNTER — Ambulatory Visit: Payer: Medicare HMO | Admitting: Pharmacist

## 2018-11-10 DIAGNOSIS — E1140 Type 2 diabetes mellitus with diabetic neuropathy, unspecified: Secondary | ICD-10-CM

## 2018-11-10 DIAGNOSIS — Z794 Long term (current) use of insulin: Principal | ICD-10-CM

## 2018-11-10 NOTE — Chronic Care Management (AMB) (Signed)
  Chronic Care Management   Follow Up Note   11/10/2018 Name: Jordan Chan MRN: [id number] DOB: 1950/09/23  Referred by: [name], NP Reason for referral : Chronic Care Management (Diabetes)   Jordan Chan is a 68 y.o. year old female who is a primary care patient of [name], [name], NP. The CCM team was consulted for assistance with chronic disease management and care coordination needs.    Contacted patient today to confirm that she had received Trulicity and [place].  Review of patient status, including review of consultants reports, relevant laboratory and other test results, and collaboration with appropriate care team members and the patient's provider was performed as part of comprehensive patient evaluation and provision of chronic care management services.    Goals Addressed            This Visit's Progress     Patient Stated   . "I want to get my blood sugars under control" (pt-stated)       Current Barriers:  . Uncontrolled diabetes- A1c 8.2% (10/15/2018) o Patient started Trulicity therapy yesterday, transitioned from Levemir to [place] this week o Reports wide range of FBG "118-280" per patient memory . Dietary indiscretions - reports that she has cut back on servings of sweet tea, and is now drinking half sweet, half unsweet tea instead of fully sweet  Pharmacist Clinical Goal(s):  [name] Over the next 30 days, patient will work with PharmD to address needs related to optimizing medication regimen  Interventions:  Discussed potential side effects, benefits of Trulicity therapy with patient. Explained that full benefit of medication would likely take a few weeks to become apparent   Congratulated patient on reduction in sugar beverage consumption, encouraged continued reduction of sugar   Patient Self Care Activities:  . Self administers medications as prescribed  . Patient CAN afford medications - patient has Medicare Extra Help; all generic  copays are ~$4 for 90 day supplies and all brand copays are $9 for 90 day supplies . Patient will begin to check SMBG fasting and 2 hour post-prandial  . Patient will work on dietary modifications: o Patient will start getting 1/2 sweet 1/2 unsweet tea, instead of full sweet tea with lunch o Patient will focus on increasing protein intake in meals  Please see past updates related to this goal by clicking on the "Past Updates" button in the selected goal         Plan:  - PharmD will outreach patient in the next 2-3 weeks to discuss blood sugar results  Catie Darnelle Maffucci, PharmD Clinical Pharmacist [place] [phone]

## 2018-11-10 NOTE — Patient Instructions (Signed)
Visit Information  Goals Addressed            This Visit's Progress     Patient Stated   . "I want to get my blood sugars under control" (pt-stated)       Current Barriers:  . Uncontrolled diabetes- A1c 8.2% (10/15/2018) o Patient started Trulicity therapy yesterday, transitioned from Levemir to [place] this week o Reports wide range of FBG "118-280" per patient memory . Dietary indiscretions - reports that she has cut back on servings of sweet tea, and is now drinking half sweet, half unsweet tea instead of fully sweet  Pharmacist Clinical Goal(s):  [name] Over the next 30 days, patient will work with PharmD to address needs related to optimizing medication regimen  Interventions:  Discussed potential side effects, benefits of Trulicity therapy with patient. Explained that full benefit of medication would likely take a few weeks to become apparent   Congratulated patient on reduction in sugar beverage consumption, encouraged continued reduction of sugar   Patient Self Care Activities:  . Self administers medications as prescribed  . Patient CAN afford medications - patient has Medicare Extra Help; all generic copays are ~$4 for 90 day supplies and all brand copays are $9 for 90 day supplies . Patient will begin to check SMBG fasting and 2 hour post-prandial  . Patient will work on dietary modifications: o Patient will start getting 1/2 sweet 1/2 unsweet tea, instead of full sweet tea with lunch o Patient will focus on increasing protein intake in meals  Please see past updates related to this goal by clicking on the "Past Updates" button in the selected goal         The patient verbalized understanding of instructions provided today and declined a print copy of patient instruction materials.   Plan:  - PharmD will outreach patient in the next 2-3 weeks to discuss blood sugar results  Catie Darnelle Maffucci, PharmD Clinical Pharmacist [place] [phone]

## 2018-11-11 DIAGNOSIS — Z978 Presence of other specified devices: Secondary | ICD-10-CM | POA: Diagnosis not present

## 2018-11-11 DIAGNOSIS — G894 Chronic pain syndrome: Secondary | ICD-10-CM | POA: Diagnosis not present

## 2018-11-11 DIAGNOSIS — Z5181 Encounter for therapeutic drug level monitoring: Secondary | ICD-10-CM | POA: Diagnosis not present

## 2018-11-11 DIAGNOSIS — Z79899 Other long term (current) drug therapy: Secondary | ICD-10-CM | POA: Diagnosis not present

## 2018-11-11 DIAGNOSIS — G893 Neoplasm related pain (acute) (chronic): Secondary | ICD-10-CM | POA: Diagnosis not present

## 2018-11-22 DIAGNOSIS — G4734 Idiopathic sleep related nonobstructive alveolar hypoventilation: Secondary | ICD-10-CM | POA: Diagnosis not present

## 2018-11-22 DIAGNOSIS — J449 Chronic obstructive pulmonary disease, unspecified: Secondary | ICD-10-CM | POA: Diagnosis not present

## 2018-11-30 ENCOUNTER — Ambulatory Visit (INDEPENDENT_AMBULATORY_CARE_PROVIDER_SITE_OTHER): Payer: Medicare HMO | Admitting: Pharmacist

## 2018-11-30 ENCOUNTER — Other Ambulatory Visit: Payer: Self-pay

## 2018-11-30 DIAGNOSIS — E1140 Type 2 diabetes mellitus with diabetic neuropathy, unspecified: Secondary | ICD-10-CM | POA: Diagnosis not present

## 2018-11-30 DIAGNOSIS — Z794 Long term (current) use of insulin: Secondary | ICD-10-CM | POA: Diagnosis not present

## 2018-11-30 NOTE — Patient Instructions (Signed)
Visit Information  Goals Addressed            This Visit's Progress     Patient Stated   . "I want to get my blood sugars under control" (pt-stated)       Current Barriers:  . Uncontrolled diabetes- A1c 8.2% (10/15/2018) o Reports FBG 110-140s; 2 hour post prandial or HS readings 150-180 with occasional over 200 . Dietary indiscretions - notes that she has a difficult time sleeping, so she stays up and eats snacks. Reports having nabs, ice cream, but endorses cutting back on portion sizes recently . Endorses decreased appetite; notes nausea starting about halfway through eating meals, though believes it has gotten better over time . Denies hypoglycemia; though does endorse being symptomatic occasionally w/ BG 90-110.  [name] Reports weight being 145-146 lbs at recent appointments with Pain Management and Pulmonary   Pharmacist Clinical Goal(s):  [name] Over the next 30 days, patient will work with PharmD to address needs related to optimizing medication regimen   Interventions:  Discussed mechanism of action of Trulicity on appetite suppression. Encouraged patient to adjust meal size expectations; for example, plan to eat half of her vegetable plate at [employer] and save remainder for leftovers. Encouraged patient to let us know if appetite suppression becomes negative on her quality of life, GLP1 therapy would need to be re-evaluated at that point  Discussed that plans moving forward could be maximization of Trulicity and decreased dose of Tresiba, but would avoid changes at this point as patient is experiencing GI effects with current Trulicity dose  Counseled on normality of symptomatic hypoglycemia with "normal" blood sugars; explained that this should improve as she gets more used to this range of BG  Congratulated on weight loss.   Patient Self Care Activities:  . Self administers medications as prescribed  . Patient CAN afford medications - patient has Medicare Extra Help; all  generic copays are ~$4 for 90 day supplies and all brand copays are $9 for 90 day supplies . Patient will continue to check SMBG fasting and 2 hour post-prandial   Please see past updates related to this goal by clicking on the "Past Updates" button in the selected goal         The patient verbalized understanding of instructions provided today and declined a print copy of patient instruction materials.   Plan:  - PharmD will outreach patient within 3-4 weeks for continued chronic care management support   Catie Darnelle Maffucci, PharmD Clinical Pharmacist [place] [phone]

## 2018-11-30 NOTE — Chronic Care Management (AMB) (Signed)
  Chronic Care Management   Follow Up Note   11/30/2018 Name: Jordan Chan MRN: [id number] DOB: [birth year]  Referred by: [name], NP Reason for referral : Chronic Care Management (Diabetes)   Jordan Chan is a 68 y.o. year old female who is a primary care patient of [name], [name], NP. The CCM team was consulted for assistance with chronic disease management and care coordination needs.    Contacted patient for telephonic review of blood sugars, medication management. She denies any other health concerns currently, besides continued effort on diabetic management.   Review of patient status, including review of consultants reports, relevant laboratory and other test results, and collaboration with appropriate care team members and the patient's provider was performed as part of comprehensive patient evaluation and provision of chronic care management services.    Goals Addressed            This Visit's Progress     Patient Stated   . "I want to get my blood sugars under control" (pt-stated)       Current Barriers:  . Uncontrolled diabetes- A1c 8.2% (10/15/2018) o Reports FBG 110-140s; 2 hour post prandial or HS readings 150-180 with occasional over 200 . Dietary indiscretions - notes that she has a difficult time sleeping, so she stays up and eats snacks. Reports having nabs, ice cream, but endorses cutting back on portion sizes recently . Endorses decreased appetite; notes nausea starting about halfway through eating meals, though believes it has gotten better over time . Denies hypoglycemia; though does endorse being symptomatic occasionally w/ BG 90-110.  [name] Reports weight being 145-146 lbs at recent appointments with Pain Management and Pulmonary   Pharmacist Clinical Goal(s):  [name] Over the next 30 days, patient will work with PharmD to address needs related to optimizing medication regimen   Interventions:  Discussed mechanism of action of Trulicity on  appetite suppression. Encouraged patient to adjust meal size expectations; for example, plan to eat half of her vegetable plate at [employer] and save remainder for leftovers. Encouraged patient to let us know if appetite suppression becomes negative on her quality of life, GLP1 therapy would need to be re-evaluated at that point  Discussed that plans moving forward could be maximization of Trulicity and decreased dose of Tresiba, but would avoid changes at this point as patient is experiencing GI effects with current Trulicity dose  Counseled on normality of symptomatic hypoglycemia with "normal" blood sugars; explained that this should improve as she gets more used to this range of BG  Congratulated on weight loss.   Patient Self Care Activities:  . Self administers medications as prescribed  . Patient CAN afford medications - patient has Medicare Extra Help; all generic copays are ~$4 for 90 day supplies and all brand copays are $9 for 90 day supplies . Patient will continue to check SMBG fasting and 2 hour post-prandial   Please see past updates related to this goal by clicking on the "Past Updates" button in the selected goal          Plan:  - PharmD will outreach patient within 3-4 weeks for continued chronic care management support   Catie Darnelle Maffucci, PharmD Clinical Pharmacist [place] [phone]

## 2018-12-06 ENCOUNTER — Ambulatory Visit (INDEPENDENT_AMBULATORY_CARE_PROVIDER_SITE_OTHER): Payer: Medicare HMO

## 2018-12-06 ENCOUNTER — Other Ambulatory Visit: Payer: Self-pay

## 2018-12-06 VITALS — Ht 58.5 in | Wt 145.0 lb

## 2018-12-06 DIAGNOSIS — Z0000 Encounter for general adult medical examination without abnormal findings: Secondary | ICD-10-CM | POA: Diagnosis not present

## 2018-12-06 NOTE — Patient Instructions (Addendum)
Jordan Chan , Thank you for taking time to come for your Medicare Wellness Visit. I appreciate your ongoing commitment to your health goals. Please review the following plan we discussed and let me know if I can assist you in the future.   Screening recommendations/referrals: Colonoscopy: completed 01/01/2017 Mammogram: completed 03/08/2018 Bone Density: completed 02/15/2016 Recommended yearly ophthalmology/optometry visit for glaucoma screening and checkup Recommended yearly dental visit for hygiene and checkup  Vaccinations: Influenza vaccine: up to date  Pneumococcal vaccine: up to date Tdap vaccine: due, check with your insurance company for coverage  Shingles vaccine: shingrix eligible, check with your insurance company for coverage     Advanced directives: Please bring a copy of your health care power of attorney and living will to the office at your convenience.  Conditions/risks identified: diabetic  Next appointment:  Follow up in one year    Preventive Care 33 Years and Older, Female Preventive care refers to lifestyle choices and visits with your health care provider that can promote health and wellness. What does preventive care include?  A yearly physical exam. This is also called an annual well check.  Dental exams once or twice a year.  Routine eye exams. Ask your health care provider how often you should have your eyes checked.  Personal lifestyle choices, including:  Daily care of your teeth and gums.  Regular physical activity.  Eating a healthy diet.  Avoiding tobacco and drug use.  Limiting alcohol use.  Practicing safe sex.  Taking low-dose aspirin every day.  Taking vitamin and mineral supplements as recommended by your health care provider. What happens during an annual well check? The services and screenings done by your health care provider during your annual well check will depend on your age, overall health, lifestyle risk factors, and family  history of disease. Counseling  Your health care provider may ask you questions about your:  Alcohol use.  Tobacco use.  Drug use.  Emotional well-being.  Home and relationship well-being.  Sexual activity.  Eating habits.  History of falls.  Memory and ability to understand (cognition).  Work and work Statistician.  Reproductive health. Screening  You may have the following tests or measurements:  Height, weight, and BMI.  Blood pressure.  Lipid and cholesterol levels. These may be checked every 5 years, or more frequently if you are over 35 years old.  Skin check.  Lung cancer screening. You may have this screening every year starting at age 2 if you have a 30-pack-year history of smoking and currently smoke or have quit within the past 15 years.  Fecal occult blood test (FOBT) of the stool. You may have this test every year starting at age 49.  Flexible sigmoidoscopy or colonoscopy. You may have a sigmoidoscopy every 5 years or a colonoscopy every 10 years starting at age 27.  Hepatitis C blood test.  Hepatitis B blood test.  Sexually transmitted disease (STD) testing.  Diabetes screening. This is done by checking your blood sugar (glucose) after you have not eaten for a while (fasting). You may have this done every 1-3 years.  Bone density scan. This is done to screen for osteoporosis. You may have this done starting at age 62.  Mammogram. This may be done every 1-2 years. Talk to your health care provider about how often you should have regular mammograms. Talk with your health care provider about your test results, treatment options, and if necessary, the need for more tests. Vaccines  Your health care  provider may recommend certain vaccines, such as:  Influenza vaccine. This is recommended every year.  Tetanus, diphtheria, and acellular pertussis (Tdap, Td) vaccine. You may need a Td booster every 10 years.  Zoster vaccine. You may need this after  age 67.  Pneumococcal 13-valent conjugate (PCV13) vaccine. One dose is recommended after age 27.  Pneumococcal polysaccharide (PPSV23) vaccine. One dose is recommended after age 16. Talk to your health care provider about which screenings and vaccines you need and how often you need them. This information is not intended to replace advice given to you by your health care provider. Make sure you discuss any questions you have with your health care provider. Document Released: 08/17/2015 Document Revised: 04/09/2016 Document Reviewed: 05/22/2015 Elsevier Interactive Patient Education  2017 [place] Prevention in the Home Falls can cause injuries. They can happen to people of all ages. There are many things you can do to make your home safe and to help prevent falls. What can I do on the outside of my home?  Regularly fix the edges of walkways and driveways and fix any cracks.  Remove anything that might make you trip as you walk through a door, such as a raised step or threshold.  Trim any bushes or trees on the path to your home.  Use bright outdoor lighting.  Clear any walking paths of anything that might make someone trip, such as rocks or tools.  Regularly check to see if handrails are loose or broken. Make sure that both sides of any steps have handrails.  Any raised decks and porches should have guardrails on the edges.  Have any leaves, snow, or ice cleared regularly.  Use sand or salt on walking paths during winter.  Clean up any spills in your garage right away. This includes oil or grease spills. What can I do in the bathroom?  Use night lights.  Install grab bars by the toilet and in the tub and shower. Do not use towel bars as grab bars.  Use non-skid mats or decals in the tub or shower.  If you need to sit down in the shower, use a plastic, non-slip stool.  Keep the floor dry. Clean up any water that spills on the floor as soon as it happens.   Remove soap buildup in the tub or shower regularly.  Attach bath mats securely with double-sided non-slip rug tape.  Do not have throw rugs and other things on the floor that can make you trip. What can I do in the bedroom?  Use night lights.  Make sure that you have a light by your bed that is easy to reach.  Do not use any sheets or blankets that are too big for your bed. They should not hang down onto the floor.  Have a firm chair that has side arms. You can use this for support while you get dressed.  Do not have throw rugs and other things on the floor that can make you trip. What can I do in the kitchen?  Clean up any spills right away.  Avoid walking on wet floors.  Keep items that you use a lot in easy-to-reach places.  If you need to reach something above you, use a strong step stool that has a grab bar.  Keep electrical cords out of the way.  Do not use floor polish or wax that makes floors slippery. If you must use wax, use non-skid floor wax.  Do not have throw  rugs and other things on the floor that can make you trip. What can I do with my stairs?  Do not leave any items on the stairs.  Make sure that there are handrails on both sides of the stairs and use them. Fix handrails that are broken or loose. Make sure that handrails are as long as the stairways.  Check any carpeting to make sure that it is firmly attached to the stairs. Fix any carpet that is loose or worn.  Avoid having throw rugs at the top or bottom of the stairs. If you do have throw rugs, attach them to the floor with carpet tape.  Make sure that you have a light switch at the top of the stairs and the bottom of the stairs. If you do not have them, ask someone to add them for you. What else can I do to help prevent falls?  Wear shoes that:  Do not have high heels.  Have rubber bottoms.  Are comfortable and fit you well.  Are closed at the toe. Do not wear sandals.  If you use a  stepladder:  Make sure that it is fully opened. Do not climb a closed stepladder.  Make sure that both sides of the stepladder are locked into place.  Ask someone to hold it for you, if possible.  Clearly mark and make sure that you can see:  Any grab bars or handrails.  First and last steps.  Where the edge of each step is.  Use tools that help you move around (mobility aids) if they are needed. These include:  Canes.  Walkers.  Scooters.  Crutches.  Turn on the lights when you go into a dark area. Replace any light bulbs as soon as they burn out.  Set up your furniture so you have a clear path. Avoid moving your furniture around.  If any of your floors are uneven, fix them.  If there are any pets around you, be aware of where they are.  Review your medicines with your doctor. Some medicines can make you feel dizzy. This can increase your chance of falling. Ask your doctor what other things that you can do to help prevent falls. This information is not intended to replace advice given to you by your health care provider. Make sure you discuss any questions you have with your health care provider. Document Released: 05/17/2009 Document Revised: 12/27/2015 Document Reviewed: 08/25/2014 Elsevier Interactive Patient Education  2017 [employer].

## 2018-12-06 NOTE — Progress Notes (Signed)
 Subjective:   Jordan Chan is a 68 y.o. female who presents for Medicare Annual (Subsequent) preventive examination.   This visit is being conducted via phone call after attempt on video chat due to the COVID-19 pandemic. This patient has given me verbal consent via phone to conduct this visit, patient states they are participating from their home address. Some vital signs may be absent or patient reported.   Patient identification: identified by name, DOB, and current address.     Review of Systems:   Cardiac Risk Factors include: advanced age (>34men, >42 women);dyslipidemia;hypertension;diabetes mellitus     Objective:     Vitals: Ht 4' 10.5" (1.486 m) Comment: patient reported  Wt 145 lb (65.8 kg) Comment: patient reported  LMP  (LMP Unknown)   BMI 29.79 kg/m   Body mass index is 29.79 kg/m.  Advanced Directives 12/04/2017 06/01/2017 04/28/2017 04/25/2017 04/25/2017 03/02/2017 01/01/2017  Does Patient Have a Medical Advance Directive? Yes Yes - Yes No;Yes No Yes  Type of Paramedic of [place];Living will - Living will Living will [place];Living will - [place];Living will  Does patient want to make changes to medical advance directive? - - No - Patient declined No - Patient declined - - -  Copy of [place] in Chart? No - copy requested - - - - - No - copy requested  Would patient like information on creating a medical advance directive? - - - - - No - Patient declined -    Tobacco Social History   Tobacco Use  Smoking Status Former Smoker  . Packs/day: 1.00  . Years: 30.00  . Pack years: 30.00  . Types: Cigarettes  . Last attempt to quit: 04/30/2004  . Years since quitting: 14.6  Smokeless Tobacco Never Used     Counseling given: Not Answered   Clinical Intake:  Pre-visit preparation completed: Yes  Pain : 0-10 Pain Score: 6  Pain Type: Chronic pain Pain Location: Foot  Pain Orientation: Right, Left Pain Descriptors / Indicators: Numbness, Aching Pain Onset: More than a month ago Pain Frequency: Constant Pain Relieving Factors: sitting   Pain Relieving Factors: sitting   Nutritional Status: BMI 25 -29 Overweight Nutritional Risks: None Diabetes: Yes CBG done?: No Did pt. bring in CBG monitor from home?: No  How often do you need to have someone help you when you read instructions, pamphlets, or other written materials from your doctor or pharmacy?: 1 - Never What is the last grade level you completed in school?: 12th grade  Nutrition Risk Assessment:  Has the patient had any N/V/D within the last 2 months?  No  Does the patient have any non-healing wounds?  No  Has the patient had any unintentional weight loss or weight gain?  No   Diabetes:  Is the patient diabetic?  Yes  If diabetic, was a CBG obtained today?  No  Did the patient bring in their glucometer from home?  No  How often do you monitor your CBG's? 1-3 times a day   Financial Strains and Diabetes Management:  Are you having any financial strains with the device, your supplies or your medication? No .  Does the patient want to be seen by Chronic Care Management for management of their diabetes?  No  talking with pharmacist already  Would the patient like to be referred to a Nutritionist or for Diabetic Management?  No   Diabetic Exams:  Diabetic Eye  Exam: Overdue for diabetic eye exam. Pt has been advised about the importance in completing this exam. Patient will call and schedule an appt. At [facility]  Diabetic Foot Exam: Completed 12/23/2017. Pt has been advised about the importance in completing this exam.    Interpreter Needed?: No  Information entered by ::  ,LPN  Past Medical History:  Diagnosis Date  . Arthritis   . Asthma   . Breast cancer ([place]) 1998   right breast ca with mastectomy and chemotherapy and radiation  . COPD (chronic  obstructive pulmonary disease) ([place])   . Diabetes mellitus without complication ([place])   . Endometriosis   . GERD (gastroesophageal reflux disease)   . IBS (irritable bowel syndrome)   . Low back pain    a. Implanted morphine/bupivicaine/clonidine pump.  . Personal history of chemotherapy   . Personal history of radiation therapy   . Sleep apnea    Past Surgical History:  Procedure Laterality Date  . ABDOMINAL HYSTERECTOMY  1987  . BACK SURGERY     Tailbone removed following fracture  . CARDIAC CATHETERIZATION    . COLONOSCOPY WITH PROPOFOL N/A 01/01/2017   Procedure: COLONOSCOPY WITH PROPOFOL;  Surgeon: Jonathon Bellows, MD;  Location[facility] END[facility]COPY;  Service: Endoscopy;  Laterality: N/A;  . ELBOW ARTHROSCOPY WITH TENDON RECONSTRUCTION    . ESOPHAGOGASTRODUODENOSCOPY (EGD) WITH PROPOFOL N/A 01/01/2017   Procedure: ESOPHAGOGASTRODUODENOSCOPY (EGD) WITH PROPOFOL;  Surgeon: Jonathon Bellows, MD;  Location: [facility] ENDOSCOPY;  Service: Endoscopy;  Laterality: N/A;  . LEFT HEART CATH AND CORONARY ANGIOGRAPHY N/A 04/28/2017   Procedure: LEFT HEART CATH AND CORONARY ANGIOGRAPHY;  Surgeon: [name], MD;  Location: [facility] CV LAB;  Service: Cardiovascular;  Laterality: N/A;  . MASTECTOMY  06/1997  . morphine pump  2011  . PLANTAR FASCIA RELEASE     Family History  Problem Relation Age of Onset  . Cancer Mother 84       lung  . Thyroid disease Mother   . Cancer Father 17       Pancreatic  . Hypertension Sister   . Cancer Sister        "cancer on face"  . Hyperlipidemia Sister   . Osteoporosis Maternal Grandmother   . Hyperlipidemia Son   . Seizures Son   . Cancer Paternal Grandmother        colon  . Arthritis Paternal Grandfather   . Breast cancer Neg Hx    Social History   Socioeconomic History  . Marital status: Legally Separated    Spouse name: Not on file  . Number of children: Not on file  . Years of education: Not on file  . Highest education level: High school  graduate  Occupational History  . Occupation: retired  Scientific laboratory technician  . Financial resource strain: Not hard at all  . Food insecurity:    Worry: Never true    Inability: Never true  . Transportation needs:    Medical: No    Non-medical: No  Tobacco Use  . Smoking status: Former Smoker    Packs/day: 1.00    Years: 30.00    Pack years: 30.00    Types: Cigarettes    Last attempt to quit: 04/30/2004    Years since quitting: 14.6  . Smokeless tobacco: Never Used  Substance and Sexual Activity  . Alcohol use: No  . Drug use: No  . Sexual activity: Not Currently    Birth control/protection: Post-menopausal  Lifestyle  . Physical activity:  Days per week: 0 days    Minutes per session: 0 min  . Stress: Not at all  Relationships  . Social connections:    Talks on phone: More than three times a week    Gets together: More than three times a week    Attends religious service: More than 4 times per year    Active member of club or organization: No    Attends meetings of clubs or organizations: Never    Relationship status: Separated  Other Topics Concern  . Not on file  Social History Narrative  . Not on file    Outpatient Encounter Medications as of 12/06/2018  Medication Sig  . Accu-Chek Softclix Lancets lancets TEST BLOOD SUGAR THREE TIMES DAILY  . albuterol (PROVENTIL HFA;VENTOLIN HFA) 108 (90 BASE) MCG/ACT inhaler Inhale 2 puffs into the lungs every 6 (six) hours as needed for wheezing or shortness of breath.  [name] aspirin EC 81 MG tablet Take 81 mg by mouth daily.  [name] atorvastatin (LIPITOR) 20 MG tablet TAKE 1 TABLET EVERY DAY  . cyclobenzaprine (FLEXERIL) 5 MG tablet Take 5 mg by mouth 2 (two) times daily as needed.  . diclofenac (VOLTAREN) 50 MG EC tablet Take 1 tablet (50 mg total) by mouth 3 (three) times daily.  . Dulaglutide (TRULICITY) 8.46 NG/2.9BM SOPN Inject 0.75 mg into the skin once a week.  . Fluticasone-Salmeterol (ADVAIR DISKUS) 250-50 MCG/DOSE AEPB Inhale 1  puff into the lungs 2 (two) times daily.  [name] gabapentin (NEURONTIN) 800 MG tablet Take 1 tablet (800 mg total) by mouth 3 (three) times daily.  [name] glucose blood (ACCU-CHEK AVIVA PLUS) test strip TEST THREE TIMES DAILY  . insulin degludec (TRESIBA FLEXTOUCH) 100 UNIT/ML SOPN FlexTouch Pen Inject 0.3 mLs (30 Units total) into the skin at bedtime.  [name] lisinopril (PRINIVIL,ZESTRIL) 5 MG tablet TAKE 1 TABLET (5 MG TOTAL) BY MOUTH DAILY.  . metFORMIN (GLUCOPHAGE) 1000 MG tablet TAKE 1 TABLET TWICE DAILY WITH MEALS  . metoprolol succinate (TOPROL-XL) 25 MG 24 hr tablet TAKE 1/2 TABLET EVERY DAY  . pantoprazole (PROTONIX) 40 MG tablet TAKE 1 TABLET TWICE DAILY  . tiotropium (SPIRIVA HANDIHALER) 18 MCG inhalation capsule Place 18 mcg into inhaler and inhale daily.  [name] venlafaxine XR (EFFEXOR-XR) 150 MG 24 hr capsule TAKE 1 CAPSULE EVERY DAY  . vitamin B-12 (CYANOCOBALAMIN) 1000 MCG tablet Take 1,000 mcg by mouth daily.  . [DISCONTINUED] furosemide (LASIX) 20 MG tablet Take 1 tablet (20 mg total) by mouth daily as needed. (Patient not taking: Reported on 12/06/2018)   No facility-administered encounter medications on file as of 12/06/2018.     Activities of Daily Living In your present state of health, do you have any difficulty performing the following activities: 12/06/2018  Hearing? N  Vision? Y  Comment blurred vision- will schedule appt with [facility]   Difficulty concentrating or making decisions? Y  Walking or climbing stairs? Y  Comment SOB, leg and back pain   Dressing or bathing? N  Doing errands, shopping? N  Preparing Food and eating ? N  Using the Toilet? N  In the past six months, have you accidently leaked urine? N  Do you have problems with loss of bowel control? N  Managing your Medications? N  Managing your Finances? N  Housekeeping or managing your Housekeeping? N  Some recent data might be hidden    Patient Care Team: [name], NP as PCP - General (Nurse  Practitioner) Erby Pian,  MD as Referring Physician (Pulmonary Disease) Kandace Blitz, MD as Referring Physician (Pain Medicine) De Hollingshead, [facility] as Pharmacist    Assessment:   This is a routine wellness examination for Roya.  Exercise Activities and Dietary recommendations Current Exercise Habits: Home exercise routine, Type of exercise: walking;stretching, Time (Minutes): 10, Frequency (Times/Week): 5, Weekly Exercise (Minutes/Week): 50, Intensity: Mild, Exercise limited by: respiratory conditions(s)  Goals    . "I want to get my blood sugars under control" (pt-stated)     Current Barriers:  . Uncontrolled diabetes- A1c 8.2% (10/15/2018) o Reports FBG 110-140s; 2 hour post prandial or HS readings 150-180 with occasional over 200 . Dietary indiscretions - notes that she has a difficult time sleeping, so she stays up and eats snacks. Reports having nabs, ice cream, but endorses cutting back on portion sizes recently . Endorses decreased appetite; notes nausea starting about halfway through eating meals, though believes it has gotten better over time . Denies hypoglycemia; though does endorse being symptomatic occasionally w/ BG 90-110.  [name] Reports weight being 145-146 lbs at recent appointments with Pain Management and Pulmonary   Pharmacist Clinical Goal(s):  [name] Over the next 30 days, patient will work with PharmD to address needs related to optimizing medication regimen   Interventions:  Discussed mechanism of action of Trulicity on appetite suppression. Encouraged patient to adjust meal size expectations; for example, plan to eat half of her vegetable plate at [employer] and save remainder for leftovers. Encouraged patient to let us know if appetite suppression becomes negative on her quality of life, GLP1 therapy would need to be re-evaluated at that point  Discussed that plans moving forward could be maximization of Trulicity and decreased dose of Tresiba,  but would avoid changes at this point as patient is experiencing GI effects with current Trulicity dose  Counseled on normality of symptomatic hypoglycemia with "normal" blood sugars; explained that this should improve as she gets more used to this range of BG  Congratulated on weight loss.   Patient Self Care Activities:  . Self administers medications as prescribed  . Patient CAN afford medications - patient has Medicare Extra Help; all generic copays are ~$4 for 90 day supplies and all brand copays are $9 for 90 day supplies . Patient will continue to check SMBG fasting and 2 hour post-prandial   Please see past updates related to this goal by clicking on the "Past Updates" button in the selected goal      . DIET - INCREASE WATER INTAKE     Recommend drinking at least 6-8 glasses of water a day        Fall Risk: Fall Risk  12/06/2018 12/04/2017 07/22/2017 03/17/2017 02/16/2017  Falls in the past year? 0 No Yes Yes No  Number falls in past yr: - - - 1 -  Injury with Fall? - - - No -    FALL RISK PREVENTION PERTAINING TO THE HOME:  Any stairs in or around the home? No  If so, are there any without handrails? n/a  Home free of loose throw rugs in walkways, pet beds, electrical cords, etc? Yes  Adequate lighting in your home to reduce risk of falls? Yes   ASSISTIVE DEVICES UTILIZED TO PREVENT FALLS:  Life alert? No  Use of a cane, walker or w/c? No  Grab bars in the bathroom? Yes  Shower chair or bench in shower? No  Elevated toilet seat or a handicapped toilet? No   TIMED UP AND  GO:  Unable to perform    Depression Screen PHQ 2/9 Scores 12/06/2018 07/15/2018 02/05/2018 12/23/2017  PHQ - 2 Score 0 0 1 1  PHQ- 9 Score - 6 6 6      Cognitive Function     6CIT Screen 12/06/2018 12/04/2017  What Year? 0 points 0 points  What month? 0 points 0 points  What time? 0 points 0 points  Count back from 20 0 points 0 points  Months in reverse 0 points 0 points  Repeat phrase 0  points 0 points  Total Score 0 0    Immunization History  Administered Date(s) Administered  . Influenza, High Dose Seasonal PF 04/28/2016, 04/13/2017, 04/14/2018  . Influenza,inj,Quad PF,6+ Mos 06/18/2015  . Influenza-Unspecified 04/04/2014  . Pneumococcal Conjugate-13 06/18/2015  . Pneumococcal Polysaccharide-23 08/06/2005, 08/05/2016  . Td 08/28/2007    Qualifies for Shingles Vaccine? Yes  Zostavax completed n/a. Due for Shingrix. Education has been provided regarding the importance of this vaccine. Pt has been advised to call insurance company to determine out of pocket expense. Advised may also receive vaccine at local pharmacy or Health Dept. Verbalized acceptance and understanding.  Tdap: Although this vaccine is not a covered service during a Wellness Exam. Education has been provided regarding the importance of this vaccine. Advised may receive this vaccine at local pharmacy or Health Dept. Aware to provide a copy of the vaccination record if obtained from local pharmacy or Health Dept. Verbalized acceptance and understanding.  Flu Vaccine: up to date   Pneumococcal Vaccine: up to date .   Screening Tests Health Maintenance  Topic Date Due  . TETANUS/TDAP  08/27/2017  . OPHTHALMOLOGY EXAM  09/01/2018  . FOOT EXAM  12/24/2018  . INFLUENZA VACCINE  03/05/2019  . HEMOGLOBIN A1C  04/16/2019  . MAMMOGRAM  03/08/2020  . COLONOSCOPY  01/02/2027  . DEXA SCAN  Completed  . Hepatitis C Screening  Completed  . PNA vac Low Risk Adult  Completed    Cancer Screenings:  Colorectal Screening: Completed 01/01/2017. Repeat every 10 years  Mammogram: Completed 03/08/2018. Repeat every year  Bone Density: Completed 02/15/2016.  Lung Cancer Screening: (Low Dose CT Chest recommended if Age 71-80 years, 30 pack-year currently smoking OR have quit w/in 15years.) does not qualify.     Additional Screening:  Hepatitis C Screening: does qualify; Completed 06/18/2015  Dental Screening:  Recommended annual dental exams for proper oral hygiene   Community Resource Referral:  CRR required this visit?  No       Plan:  I have personally reviewed and addressed the Medicare Annual Wellness questionnaire and have noted the following in the patient's chart:  A. Medical and social history B. Use of alcohol, tobacco or illicit drugs  C. Current medications and supplements D. Functional ability and status E.  Nutritional status F.  Physical activity G. Advance directives H. List of other physicians I.  Hospitalizations, surgeries, and ER visits in previous 12 months J.  [place] such as hearing and vision if needed, cognitive and depression L. Referrals and appointments   In addition, I have reviewed and discussed with patient certain preventive protocols, quality metrics, and best practice recommendations. A written personalized care plan for preventive services as well as general preventive health recommendations were provided to patient via mail.   Signed,    Bevelyn Ngo, LPN  10/03/9516 Nurse Health Advisor   Nurse Notes:none

## 2018-12-09 ENCOUNTER — Ambulatory Visit: Payer: Medicare HMO

## 2018-12-10 ENCOUNTER — Other Ambulatory Visit: Payer: Self-pay | Admitting: Unknown Physician Specialty

## 2018-12-10 NOTE — Telephone Encounter (Signed)
Requested Prescriptions  Pending Prescriptions Disp Refills  . metFORMIN (GLUCOPHAGE) 1000 MG tablet [Pharmacy Med Name: METFORMIN HYDROCHLORIDE 1000 MG Tablet] 180 tablet 1    Sig: TAKE 1 TABLET TWICE DAILY WITH MEALS     Endocrinology:  Diabetes - Biguanides Failed - 12/10/2018  2:05 PM      Failed - HBA1C is between 0 and 7.9 and within 180 days    Hemoglobin A1C  Date Value Ref Range Status  04/28/2016 7.7%  Final   HB A1C (BAYER DCA - WAIVED)  Date Value Ref Range Status  10/14/2018 8.2 (H) <7.0 % Final    Comment:                                          Diabetic Adult            <7.0                                       Healthy Adult        4.3 - 5.7                                                           (DCCT/NGSP) American Diabetes Association's Summary of Glycemic Recommendations for Adults with Diabetes: Hemoglobin A1c <7.0%. More stringent glycemic goals (A1c <6.0%) may further reduce complications at the cost of increased risk of hypoglycemia.          Passed - Cr in normal range and within 360 days    Creatinine  Date Value Ref Range Status  09/14/2013 0.68 0.60 - 1.30 mg/dL Final   Creatinine, Ser  Date Value Ref Range Status  07/15/2018 0.64 0.57 - 1.00 mg/dL Final         Passed - eGFR in normal range and within 360 days    EGFR (African American)  Date Value Ref Range Status  09/14/2013 >60  Final   GFR calc Af Amer  Date Value Ref Range Status  07/15/2018 106 >59 mL/min/1.73 Final   EGFR (Non-African Amer.)  Date Value Ref Range Status  09/14/2013 >60  Final    Comment:    eGFR values <61m/min/1.73 m2 may be an indication of chronic kidney disease (CKD). Calculated eGFR is useful in patients with stable renal function. The eGFR calculation will not be reliable in acutely ill patients when serum creatinine is changing rapidly. It is not useful in  patients on dialysis. The eGFR calculation may not be applicable to patients at the low and  high extremes of body sizes, pregnant women, and vegetarians.    GFR calc non Af Amer  Date Value Ref Range Status  07/15/2018 92 >59 mL/min/1.73 Final         Passed - Valid encounter within last 6 months    Recent Outpatient Visits          1 month ago Type 2 diabetes mellitus with diabetic neuropathy, with long-term current use of insulin ([place]   [place], [place]   4 months ago Uncontrolled type 2 diabetes mellitus with chronic kidney disease ([place]  [facility] [place], [place] T, NP   8 months ago Uncontrolled type 2 diabetes mellitus with chronic kidney disease ([place])   [place], Megan P, DO   10 months ago Uncontrolled type 2 diabetes mellitus with chronic kidney disease ([place])   [place] Kathrine Haddock, NP   11 months ago Burning with urination   [facility] Kathrine Haddock, NP      Future Appointments            In 1 month [name], [name], NP [employer]

## 2018-12-13 ENCOUNTER — Other Ambulatory Visit: Payer: Self-pay | Admitting: Nurse Practitioner

## 2018-12-13 NOTE — Telephone Encounter (Signed)
Requested Prescriptions  Pending Prescriptions Disp Refills  . venlafaxine XR (EFFEXOR-XR) 150 MG 24 hr capsule [Pharmacy Med Name: VENLAFAXINE HCL ER 150 MG Capsule Extended Release 24 Hour] 90 capsule 0    Sig: TAKE 1 CAPSULE EVERY DAY     Psychiatry: Antidepressants - SNRI - desvenlafaxine & venlafaxine Passed - 12/13/2018  3:22 PM      Passed - LDL in normal range and within 360 days    LDL Calculated  Date Value Ref Range Status  07/15/2018 46 0 - 99 mg/dL Final         Passed - Total Cholesterol in normal range and within 360 days    Cholesterol, Total  Date Value Ref Range Status  07/15/2018 132 100 - 199 mg/dL Final   Cholesterol Piccolo, Waived  Date Value Ref Range Status  10/01/2015 132 <200 mg/dL Final    Comment:                            Desirable                <200                         Borderline High      200- 239                         High                     >239          Passed - Triglycerides in normal range and within 360 days    Triglycerides  Date Value Ref Range Status  07/15/2018 69 0 - 149 mg/dL Final   Triglycerides Piccolo,Waived  Date Value Ref Range Status  10/01/2015 79 <150 mg/dL Final    Comment:                            Normal                   <150                         Borderline High     150 - 199                         High                200 - 499                         Very High                >499          Passed - Completed PHQ-2 or PHQ-9 in the last 360 days.      Passed - Last BP in normal range    BP Readings from Last 1 Encounters:  10/14/18 133/82         Passed - Valid encounter within last 6 months    Recent Outpatient Visits          2 months ago Type 2 diabetes mellitus with diabetic neuropathy, with long-term current use of insulin ([place])   [facility] [place], [name], NP  5 months ago Uncontrolled type 2 diabetes mellitus with chronic kidney disease ([place])   [place]  [place], Jolene T, NP   8 months ago Uncontrolled type 2 diabetes mellitus with chronic kidney disease ([place])   [place], Megan P, DO   10 months ago Uncontrolled type 2 diabetes mellitus with chronic kidney disease ([place])   [place] Kathrine Haddock, NP   11 months ago Burning with urination   [facility] Kathrine Haddock, NP      Future Appointments            In 1 month [name], [name], NP [employer]

## 2018-12-15 ENCOUNTER — Telehealth: Payer: Self-pay | Admitting: *Deleted

## 2018-12-15 NOTE — Telephone Encounter (Signed)
Virtual Visit Pre-Appointment Phone Call  "(Name), I am calling you today to discuss your upcoming appointment. We are currently trying to limit exposure to the virus that causes COVID-19 by seeing patients at home rather than in the office."  1. "What is the BEST phone number to call the day of the visit?" - include this in appointment notes  2. "Do you have or have access to (through a family member/friend) a smartphone with video capability that we can use for your visit?" a. If yes - list this number in appt notes as "cell" (if different from BEST phone #) and list the appointment type as a VIDEO visit in appointment notes  3. Confirm consent - "In the setting of the current Covid19 crisis, you are scheduled for a (phone or video) visit with your provider on (date) at (time).  Just as we do with many in-office visits, in order for you to participate in this visit, we must obtain consent.  If you'd like, I can send this to your mychart (if signed up) or email for you to review.  Otherwise, I can obtain your verbal consent now.  All virtual visits are billed to your insurance company just like a normal visit would be.  By agreeing to a virtual visit, we'd like you to understand that the technology does not allow for your provider to perform an examination, and thus may limit your provider's ability to fully assess your condition. If your provider identifies any concerns that need to be evaluated in person, we will make arrangements to do so.  Finally, though the technology is pretty good, we cannot assure that it will always work on either your or our end, and in the setting of a video visit, we may have to convert it to a phone-only visit.  In either situation, we cannot ensure that we have a secure connection.  Are you willing to proceed?" STAFF: Did the patient verbally acknowledge consent to telehealth visit? Document YES/NO here: yes  4. Advise patient to be prepared - "Two hours prior to  your appointment, go ahead and check your blood pressure, pulse, oxygen saturation, and your weight (if you have the equipment to check those) and write them all down. When your visit starts, your provider will ask you for this information. If you have an Apple Watch or Kardia device, please plan to have heart rate information ready on the day of your appointment. Please have a pen and paper handy nearby the day of the visit as well."  Patient says she has scale and wrist BP cuff. She does not feel the BP cuff is accurate.   5. Give patient instructions for MyChart download to smartphone OR Doximity/Doxy.me as below if video visit (depending on what platform provider is using)  6. Inform patient they will receive a phone call 15 minutes prior to their appointment time (may be from unknown caller ID) so they should be prepared to answer    TELEPHONE CALL NOTE  ANNAKA Chan has been deemed a candidate for a follow-up tele-health visit to limit community exposure during the Covid-19 pandemic. I spoke with the patient via phone to ensure availability of phone/video source, confirm preferred email & phone number, and discuss instructions and expectations.  I reminded Jordan Chan to be prepared with any vital sign and/or heart rhythm information that could potentially be obtained via home monitoring, at the time of her visit. I reminded Jordan Chan  to expect a phone call prior to her visit.  Jordan Jaffe, RN 12/15/2018 4:52 PM  IF USING DOXIMITY or DOXY.ME - The patient will receive a link just prior to their visit by text.     FULL LENGTH CONSENT FOR TELE-HEALTH VISIT   I hereby voluntarily request, consent and authorize [facility] and its employed or contracted physicians, physician assistants, nurse practitioners or other licensed health care professionals (the Practitioner), to provide me with telemedicine health care services (the "Services") as deemed  necessary by the treating Practitioner. I acknowledge and consent to receive the Services by the Practitioner via telemedicine. I understand that the telemedicine visit will involve communicating with the Practitioner through live audiovisual communication technology and the disclosure of certain medical information by electronic transmission. I acknowledge that I have been given the opportunity to request an in-person assessment or other available alternative prior to the telemedicine visit and am voluntarily participating in the telemedicine visit.  I understand that I have the right to withhold or withdraw my consent to the use of telemedicine in the course of my care at any time, without affecting my right to future care or treatment, and that the Practitioner or I may terminate the telemedicine visit at any time. I understand that I have the right to inspect all information obtained and/or recorded in the course of the telemedicine visit and may receive copies of available information for a reasonable fee.  I understand that some of the potential risks of receiving the Services via telemedicine include:  [name] Delay or interruption in medical evaluation due to technological equipment failure or disruption; . Information transmitted may not be sufficient (e.g. poor resolution of images) to allow for appropriate medical decision making by the Practitioner; and/or  . In rare instances, security protocols could fail, causing a breach of personal health information.  Furthermore, I acknowledge that it is my responsibility to provide information about my medical history, conditions and care that is complete and accurate to the best of my ability. I acknowledge that Practitioner's advice, recommendations, and/or decision may be based on factors not within their control, such as incomplete or inaccurate data provided by me or distortions of diagnostic images or specimens that may result from electronic  transmissions. I understand that the practice of medicine is not an exact science and that Practitioner makes no warranties or guarantees regarding treatment outcomes. I acknowledge that I will receive a copy of this consent concurrently upon execution via email to the email address I last provided but may also request a printed copy by calling the office of [facility].    I understand that my insurance will be billed for this visit.   I have read or had this consent read to me. . I understand the contents of this consent, which adequately explains the benefits and risks of the Services being provided via telemedicine.  . I have been provided ample opportunity to ask questions regarding this consent and the Services and have had my questions answered to my satisfaction. . I give my informed consent for the services to be provided through the use of telemedicine in my medical care  By participating in this telemedicine visit I agree to the above.

## 2018-12-20 ENCOUNTER — Other Ambulatory Visit: Payer: Self-pay

## 2018-12-20 ENCOUNTER — Encounter: Payer: Self-pay | Admitting: Internal Medicine

## 2018-12-20 ENCOUNTER — Telehealth (INDEPENDENT_AMBULATORY_CARE_PROVIDER_SITE_OTHER): Payer: Medicare HMO | Admitting: Internal Medicine

## 2018-12-20 VITALS — BP 123/59 | HR 92 | Ht 58.5 in | Wt 145.0 lb

## 2018-12-20 DIAGNOSIS — I5181 Takotsubo syndrome: Secondary | ICD-10-CM | POA: Diagnosis not present

## 2018-12-20 NOTE — Progress Notes (Signed)
Virtual Visit via Video Note   This visit type was conducted due to national recommendations for restrictions regarding the COVID-19 Pandemic (e.g. social distancing) in an effort to limit this patient's exposure and mitigate transmission in our community.  Due to her co-morbid illnesses, this patient is at least at moderate risk for complications without adequate follow up.  This format is felt to be most appropriate for this patient at this time.  All issues noted in this document were discussed and addressed.  A limited physical exam was performed with this format.  Please refer to the patient's chart for her consent to telehealth for [facility].   Date:  12/20/2018   ID:  Jordan Chan, DOB 1951/05/06, MRN [id number]  Patient Location: Home Provider Location: Home  PCP:  [name], NP  Cardiologist:  [name], MD Electrophysiologist:  None   Evaluation Performed:  Follow-Up Visit  Chief Complaint:  Follow-up cardiomyopathy  History of Present Illness:    Jordan Chan is a 68 y.o. female with history of stress-induced cardiomyopathy in 04/2017 in the setting of pain pump malfunction and opioid withdrawal, chronic low back pain, diabetes mellitus, COPD, GERD, sleep apnea, and IBS.  I last saw her in 07/2017, at which time she was doing well following replacement of her pain pump.  Echo in 08/2017 shows low-normal LVEF.  Standing furosemide was discontinued in favor of prn dosing for edema/weight gain.  Today, Jordan Chan reports that she has been doing well other than chronic back pain that is "tolerable" with the assistance of her pain pump.  Chronic exertional dyspnea is unchanged.  She attributes this to COPD.  She denies chest pain, orthopnea, edema, palpitations, and lightheadedness.  Blood sugar has been improved with some adjustments to her diabetes regimen by [name].  The patient does not have symptoms concerning for COVID-19 infection (fever,  chills, cough, or new shortness of breath).    Past Medical History:  Diagnosis Date  . Arthritis   . Asthma   . Breast cancer ([place]) 1998   right breast ca with mastectomy and chemotherapy and radiation  . COPD (chronic obstructive pulmonary disease) ([place])   . Diabetes mellitus without complication ([place])   . Endometriosis   . GERD (gastroesophageal reflux disease)   . IBS (irritable bowel syndrome)   . Low back pain    a. Implanted morphine/bupivicaine/clonidine pump.  . Personal history of chemotherapy   . Personal history of radiation therapy   . Sleep apnea    Past Surgical History:  Procedure Laterality Date  . ABDOMINAL HYSTERECTOMY  1987  . BACK SURGERY     Tailbone removed following fracture  . CARDIAC CATHETERIZATION    . COLONOSCOPY WITH PROPOFOL N/A 01/01/2017   Procedure: COLONOSCOPY WITH PROPOFOL;  Surgeon: Jonathon Bellows, MD;  Location: [facility] ENDOSCOPY;  Service: Endoscopy;  Laterality: N/A;  . ELBOW ARTHROSCOPY WITH TENDON RECONSTRUCTION    . ESOPHAGOGASTRODUODENOSCOPY (EGD) WITH PROPOFOL N/A 01/01/2017   Procedure: ESOPHAGOGASTRODUODENOSCOPY (EGD) WITH PROPOFOL;  Surgeon: Jonathon Bellows, MD;  [facility];  Service: Endoscopy;  Laterality: N/A;  . LEFT HEART CATH AND CORONARY ANGIOGRAPHY N/A 04/28/2017   Procedure: LEFT HEART CATH AND CORONARY ANGIOGRAPHY;  Surgeon: [name], MD;  Location: [facility] LAB;  Service: Cardiovascular;  Laterality: N/A;  . MASTECTOMY  06/1997  . morphine pump  2011  . PLANTAR FASCIA RELEASE       Current Meds  Medication Sig  . Accu-Chek Softclix  Lancets lancets TEST BLOOD SUGAR THREE TIMES DAILY  . albuterol (PROVENTIL HFA;VENTOLIN HFA) 108 (90 BASE) MCG/ACT inhaler Inhale 2 puffs into the lungs every 6 (six) hours as needed for wheezing or shortness of breath.  [name] aspirin EC 81 MG tablet Take 81 mg by mouth daily.  [name] atorvastatin (LIPITOR) 20 MG tablet TAKE 1 TABLET EVERY DAY  . cyclobenzaprine (FLEXERIL) 5 MG  tablet Take 5 mg by mouth 2 (two) times daily as needed.  . diclofenac (VOLTAREN) 50 MG EC tablet Take 1 tablet (50 mg total) by mouth 3 (three) times daily.  . Dulaglutide (TRULICITY) 3.88 EK/8.0KL SOPN Inject 0.75 mg into the skin once a week.  . Fluticasone-Salmeterol (ADVAIR DISKUS) 250-50 MCG/DOSE AEPB Inhale 1 puff into the lungs 2 (two) times daily.  [name] gabapentin (NEURONTIN) 800 MG tablet Take 1 tablet (800 mg total) by mouth 3 (three) times daily.  [name] glucose blood (ACCU-CHEK AVIVA PLUS) test strip TEST THREE TIMES DAILY  . insulin degludec (TRESIBA FLEXTOUCH) 100 UNIT/ML SOPN FlexTouch Pen Inject 0.3 mLs (30 Units total) into the skin at bedtime.  [name] lisinopril (PRINIVIL,ZESTRIL) 5 MG tablet TAKE 1 TABLET (5 MG TOTAL) BY MOUTH DAILY.  . metFORMIN (GLUCOPHAGE) 1000 MG tablet TAKE 1 TABLET TWICE DAILY WITH MEALS  . metoprolol succinate (TOPROL-XL) 25 MG 24 hr tablet TAKE 1/2 TABLET EVERY DAY  . pantoprazole (PROTONIX) 40 MG tablet TAKE 1 TABLET TWICE DAILY  . tiotropium (SPIRIVA HANDIHALER) 18 MCG inhalation capsule Place 18 mcg into inhaler and inhale daily.  [name] venlafaxine XR (EFFEXOR-XR) 150 MG 24 hr capsule TAKE 1 CAPSULE EVERY DAY  . vitamin B-12 (CYANOCOBALAMIN) 1000 MCG tablet Take 1,000 mcg by mouth daily.     Allergies:   Other; Pain patch [menthol]; Avelox [moxifloxacin hcl in nacl]; Doxycycline; Erythromycin; Fentanyl; Oxycontin [oxycodone]; and Ozempic [semaglutide]   Social History   Tobacco Use  . Smoking status: Former Smoker    Packs/day: 1.00    Years: 30.00    Pack years: 30.00    Types: Cigarettes    Last attempt to quit: 04/30/2004    Years since quitting: 14.6  . Smokeless tobacco: Never Used  Substance Use Topics  . Alcohol use: No  . Drug use: No     Family Hx: The patient's family history includes Arthritis in her paternal grandfather; Cancer in her paternal grandmother and sister; Cancer (age of onset: 80) in her father; Cancer (age of onset: 36) in her  mother; Hyperlipidemia in her sister and son; Hypertension in her sister; Osteoporosis in her maternal grandmother; Seizures in her son; Thyroid disease in her mother. There is no history of Breast cancer.  ROS:   Please see the history of present illness.   All other systems reviewed and are negative.   Prior CV studies:   The following studies were reviewed today:  Limited echo (08/13/2017): Normal LV size and wall thickness.  LVEF 50-55% with normal wall motion.  LHC (04/28/2017): No signifiant CAD.  LVEF 25-30% with mid and apical akinesis consistent with stress-induced cardiomyopathy.  Mildly elevated LVEDP.  Echo (04/28/2017): Upper normal LV size with normal wall thickness.  LVEF 25-30% with severe hypokinesis of the mid and apical myocardium.  Mild left atrial enlargement.  Labs/Other Tests and Data Reviewed:    EKG:  No ECG reviewed.  Recent Labs: 07/15/2018: ALT 7; BUN 9; Creatinine, Ser 0.64; Hemoglobin 11.1; Platelets 463; Potassium 5.0; Sodium 142   Recent Lipid Panel Lab Results  Component Value  Date/Time   CHOL 132 07/15/2018 09:23 AM   CHOL 132 10/01/2015 10:12 AM   TRIG 69 07/15/2018 09:23 AM   TRIG 79 10/01/2015 10:12 AM   HDL 72 07/15/2018 09:23 AM   LDLCALC 46 07/15/2018 09:23 AM    Wt Readings from Last 3 Encounters:  12/20/18 145 lb (65.8 kg)  12/06/18 145 lb (65.8 kg)  10/14/18 153 lb (69.4 kg)     Objective:    Vital Signs:  BP (!) 123/59 (BP Location: Left Arm, Patient Position: Sitting, Cuff Size: Normal)   Pulse 92   Ht 4' 10.5" (1.486 m)   Wt 145 lb (65.8 kg)   LMP  (LMP Unknown)   BMI 29.79 kg/m    VITAL SIGNS:  reviewed GEN:  no acute distress  ASSESSMENT & PLAN:    Stress-induced cardiomyopathy: No symptoms to suggest recurrence.  Follow-up echo in 08/2017 showed significant improvement in systolic function (LVEF 08-65%).  Chronic exertional dyspnea is most likely related to underlying lung disease/COPD.  We will continue current doses  metoprolol succinate and lisinopril.  If symptomatic hypotension becomes an issue, one or both of these agents may need to be weaned.  COVID-19 Education: The signs and symptoms of COVID-19 were discussed with the patient and how to seek care for testing (follow up with PCP or arrange E-visit).  The importance of social distancing was discussed today.  Time:   Today, I have spent 10 minutes with the patient with telehealth technology discussing the above problems.    Medication Adjustments/Labs and Tests Ordered: Current medicines are reviewed at length with the patient today.  Concerns regarding medicines are outlined above.   Tests Ordered: None.  Medication Changes: None.  Disposition:  Follow up in 1 year(s)  Signed, [name], MD  12/20/2018 9:38 AM    [facility]

## 2018-12-20 NOTE — Patient Instructions (Signed)
Medication Instructions:  Your physician recommends that you continue on your current medications as directed. Please refer to the Current Medication list given to you today.  If you need a refill on your cardiac medications before your next appointment, please call your pharmacy.   Lab work: none If you have labs (blood work) drawn today and your tests are completely normal, you will receive your results only by: [name] MyChart Message (if you have MyChart) OR . A paper copy in the mail If you have any lab test that is abnormal or we need to change your treatment, we will call you to review the results.  Testing/Procedures: none  Follow-Up: At [facility], you and your health needs are our priority.  As part of our continuing mission to provide you with exceptional heart care, we have created designated Provider Care Teams.  These Care Teams include your primary Cardiologist (physician) and Advanced Practice Providers (APPs -  Physician Assistants and Nurse Practitioners) who all work together to provide you with the care you need, when you need it. You will need a follow up appointment in 12 months.  Please call our office 2 months in advance to schedule this appointment.  You may see DR Harrell Gave END or one of the following Advanced Practice Providers on your designated Care Team:   Murray Hodgkins, NP [name], PA-C . Marrianne Mood, PA-C

## 2018-12-21 ENCOUNTER — Ambulatory Visit (INDEPENDENT_AMBULATORY_CARE_PROVIDER_SITE_OTHER): Payer: Medicare HMO | Admitting: Pharmacist

## 2018-12-21 ENCOUNTER — Other Ambulatory Visit: Payer: Self-pay

## 2018-12-21 DIAGNOSIS — E1140 Type 2 diabetes mellitus with diabetic neuropathy, unspecified: Secondary | ICD-10-CM

## 2018-12-21 DIAGNOSIS — Z794 Long term (current) use of insulin: Secondary | ICD-10-CM

## 2018-12-21 NOTE — Patient Instructions (Signed)
Visit Information  Goals Addressed            This Visit's Progress     Patient Stated   . "I want to get my blood sugars under control" (pt-stated)       Current Barriers:  . Uncontrolled diabetes- A1c 8.2% (10/15/2018)  fasting after dinner midnight    109 157 162   123 229 142   162 235 106   142 157 125   128 168    115 129    125 166    98     100     86    Average  119 177 133   . Dietary indiscretions - notes that she has a difficult time sleeping, so she stays up and eats snacks. Reports having nabs, ice cream, but endorses cutting back on portion sizes recently. She has become aware of how different foods effect her blood sugars and can predict when she will have a reading greater than 200 and has made great strides in changing her diet, including cutting her sweet tea with unsweet.   . States her decreased appetite is a chronic issue but has not worsened; notes nausea with Trulicity has improved and she is pleased with how Trulicity makes her feel.  . Denies hypoglycemia and has no readings less than 70; though does endorse being symptomatic occasionally w/ BG 90-110.   Pharmacist Clinical Goal(s):  [name] Over the next 30 days, patient will work with PharmD to address needs related to optimizing medication regimen   Interventions:  Discussed that plans moving forward could be maximization of Trulicity and decreased dose of Tresiba, but would avoid changes at this point as patient is experiencing GI effects with current Trulicity dose and patient is hesitant to increase at this time  Consider SGLT-2 inhibitor for future post prandial control with the added cardiovascular and renal benefits, as well as reduced insulin burden and reduced risk of hypoglycemia   Counseled on signs/symptoms of hypoglycemia and how to treat episodes. Encouraged patient to report any hypoglycemic events less than 70.   Patient Self Care Activities:  . Self administers medications as prescribed   . Patient CAN afford medications - patient has Medicare Extra Help; all generic copays are ~$4 for 90 day supplies and all brand copays are $9 for 90 day supplies . Patient will continue to check SMBG fasting and 2 hour post-prandial  . Patient will continue to make dietary changes and choose smaller portion sizes of medications that can cause hyperglycemia   Please see past updates related to this goal by clicking on the "Past Updates" button in the selected goal         The patient verbalized understanding of instructions provided today and declined a print copy of patient instruction materials.   Plan:  - PharmD will outreach patient within 4-5 weeks for continued chronic care management support   Catie Darnelle Maffucci, PharmD Clinical Pharmacist [place] [phone]

## 2018-12-21 NOTE — Chronic Care Management (AMB) (Signed)
Chronic Care Management   Follow Up Note   12/21/2018 Name: Jordan Chan MRN: [id number] DOB: [birth year]  Referred by: Jordan Lick, NP Reason for referral : Diabetes   Jordan Chan is a 68 y.o. year old female who is a primary care patient of [name], Jordan Faster, NP. The CCM team was consulted for assistance with chronic disease management and care coordination needs.    Contacted patient for telephonic review of blood sugars, medication management. She denies any other health concerns currently, besides continued effort on diabetic management.   Review of patient status, including review of consultants reports, relevant laboratory and other test results, and collaboration with appropriate care team members and the patient's provider was performed as part of comprehensive patient evaluation and provision of chronic care management services.    Goals Addressed            This Visit's Progress     Patient Stated   . "I want to get my blood sugars under control" (pt-stated)       Current Barriers:  . Uncontrolled diabetes- A1c 8.2% (10/15/2018)  fasting after dinner midnight    109 157 162   123 229 142   162 235 106   142 157 125   128 168    115 129    125 166    98     100     86    Average  119 177 133   . Dietary indiscretions - notes that she has a difficult time sleeping, so she stays up and eats snacks. Reports having nabs, ice cream, but endorses cutting back on portion sizes recently. She has become aware of how different foods effect her blood sugars and can predict when she will have a reading greater than 200 and has made great strides in changing her diet, including cutting her sweet tea with unsweet.   . States her decreased appetite is a chronic issue but has not worsened; notes nausea with Trulicity has improved and she is pleased with how Trulicity makes her feel.  . Denies hypoglycemia and has no readings less than 70; though does endorse  being symptomatic occasionally w/ BG 90-110.   Pharmacist Clinical Goal(s):  [name] Over the next 30 days, patient will work with PharmD to address needs related to optimizing medication regimen   Interventions:  Discussed that plans moving forward could be maximization of Trulicity and decreased dose of Tresiba, but would avoid changes at this point as patient is experiencing GI effects with current Trulicity dose and patient is hesitant to increase at this time  Consider SGLT-2 inhibitor for future post prandial control with the added cardiovascular and renal benefits, as well as reduced insulin burden and reduced risk of hypoglycemia   Counseled on signs/symptoms of hypoglycemia and how to treat episodes. Encouraged patient to report any hypoglycemic events less than 70.   Patient Self Care Activities:  . Self administers medications as prescribed  . Patient CAN afford medications - patient has Medicare Extra Help; all generic copays are ~$4 for 90 day supplies and all brand copays are $9 for 90 day supplies . Patient will continue to check SMBG fasting and 2 hour post-prandial  . Patient will continue to make dietary changes and choose smaller portion sizes of medications that can cause hyperglycemia   Please see past updates related to this goal by clicking on the "Past Updates" button in the selected goal  Plan:  - PharmD will outreach patient within 4-5 weeks for continued chronic care management support   Jordan Chan, PharmD Clinical Pharmacist [place] [phone]

## 2018-12-27 ENCOUNTER — Other Ambulatory Visit: Payer: Self-pay | Admitting: Nurse Practitioner

## 2019-01-06 ENCOUNTER — Other Ambulatory Visit: Payer: Self-pay | Admitting: Family Medicine

## 2019-01-06 NOTE — Telephone Encounter (Signed)
Requested Prescriptions  Pending Prescriptions Disp Refills  . metoprolol succinate (TOPROL-XL) 25 MG 24 hr tablet [Pharmacy Med Name: METOPROLOL SUCCINATE ER 25 MG Tablet Extended Release 24 Hour] 45 tablet 1    Sig: TAKE 1/2 TABLET EVERY DAY     Cardiovascular:  Beta Blockers Passed - 01/06/2019 12:09 AM      Passed - Last BP in normal range    BP Readings from Last 1 Encounters:  12/20/18 (!) 123/59         Passed - Last Heart Rate in normal range    Pulse Readings from Last 1 Encounters:  12/20/18 92         Passed - Valid encounter within last 6 months    Recent Outpatient Visits          2 months ago Type 2 diabetes mellitus with diabetic neuropathy, with long-term current use of insulin ([place])   [place], [place] T, NP   5 months ago Uncontrolled type 2 diabetes mellitus with chronic kidney disease ([place])   [place], Jolene T, NP   8 months ago Uncontrolled type 2 diabetes mellitus with chronic kidney disease ([place])   [place], Megan P, DO   11 months ago Uncontrolled type 2 diabetes mellitus with chronic kidney disease ([place])   [place] Kathrine Haddock, NP   1 year ago Burning with urination   [place] Kathrine Haddock, NP      Future Appointments            In 1 week [name], [name], NP [facility]         . lisinopril (ZESTRIL) 5 MG tablet [Pharmacy Med Name: LISINOPRIL 5 MG Tablet] 90 tablet 1    Sig: TAKE 1 TABLET (5 MG TOTAL) BY MOUTH DAILY.     Cardiovascular:  ACE Inhibitors Passed - 01/06/2019 12:09 AM      Passed - Cr in normal range and within 180 days    Creatinine  Date Value Ref Range Status  09/14/2013 0.68 0.60 - 1.30 mg/dL Final   Creatinine, Ser  Date Value Ref Range Status  07/15/2018 0.64 0.57 - 1.00 mg/dL Final         Passed - K in normal range and within 180 days    Potassium  Date Value Ref Range Status  07/15/2018 5.0  3.5 - 5.2 mmol/L Final  09/14/2013 3.5 3.5 - 5.1 mmol/L Final         Passed - Patient is not pregnant      Passed - Last BP in normal range    BP Readings from Last 1 Encounters:  12/20/18 (!) 123/59         Passed - Valid encounter within last 6 months    Recent Outpatient Visits          2 months ago Type 2 diabetes mellitus with diabetic neuropathy, with long-term current use of insulin ([place])   [place], [place] T, NP   5 months ago Uncontrolled type 2 diabetes mellitus with chronic kidney disease ([place])   [place], Jolene T, NP   8 months ago Uncontrolled type 2 diabetes mellitus with chronic kidney disease ([place])   [place], Megan P, DO   11 months ago Uncontrolled type 2 diabetes mellitus with chronic kidney disease ([place])   [place] Kathrine Haddock, NP   1 year ago Burning  with urination   [facility] Kathrine Haddock, NP      Future Appointments            In 1 week [name], [name], NP [facility]

## 2019-01-07 ENCOUNTER — Other Ambulatory Visit: Payer: Self-pay

## 2019-01-07 MED ORDER — PANTOPRAZOLE SODIUM 40 MG PO TBEC: 40.0000 mg | DELAYED_RELEASE_TABLET | Freq: Two times a day (BID) | ORAL | 2 refills | Status: DC

## 2019-01-07 NOTE — Telephone Encounter (Signed)
Patient last seen 10/14/18 and has f/up 01/17/19.

## 2019-01-17 ENCOUNTER — Other Ambulatory Visit: Payer: Self-pay

## 2019-01-17 ENCOUNTER — Ambulatory Visit (INDEPENDENT_AMBULATORY_CARE_PROVIDER_SITE_OTHER): Payer: Medicare HMO | Admitting: Nurse Practitioner

## 2019-01-17 ENCOUNTER — Encounter: Payer: Self-pay | Admitting: Nurse Practitioner

## 2019-01-17 VITALS — BP 118/60 | HR 83 | Ht 58.5 in | Wt 150.0 lb

## 2019-01-17 DIAGNOSIS — E538 Deficiency of other specified B group vitamins: Secondary | ICD-10-CM | POA: Diagnosis not present

## 2019-01-17 DIAGNOSIS — E785 Hyperlipidemia, unspecified: Secondary | ICD-10-CM

## 2019-01-17 DIAGNOSIS — G894 Chronic pain syndrome: Secondary | ICD-10-CM | POA: Diagnosis not present

## 2019-01-17 DIAGNOSIS — E1140 Type 2 diabetes mellitus with diabetic neuropathy, unspecified: Secondary | ICD-10-CM | POA: Diagnosis not present

## 2019-01-17 DIAGNOSIS — K219 Gastro-esophageal reflux disease without esophagitis: Secondary | ICD-10-CM | POA: Diagnosis not present

## 2019-01-17 DIAGNOSIS — F324 Major depressive disorder, single episode, in partial remission: Secondary | ICD-10-CM

## 2019-01-17 DIAGNOSIS — J449 Chronic obstructive pulmonary disease, unspecified: Secondary | ICD-10-CM | POA: Diagnosis not present

## 2019-01-17 DIAGNOSIS — E1169 Type 2 diabetes mellitus with other specified complication: Secondary | ICD-10-CM

## 2019-01-17 DIAGNOSIS — Z0000 Encounter for general adult medical examination without abnormal findings: Secondary | ICD-10-CM | POA: Diagnosis not present

## 2019-01-17 DIAGNOSIS — Z23 Encounter for immunization: Secondary | ICD-10-CM

## 2019-01-17 DIAGNOSIS — I10 Essential (primary) hypertension: Secondary | ICD-10-CM | POA: Diagnosis not present

## 2019-01-17 DIAGNOSIS — Z794 Long term (current) use of insulin: Secondary | ICD-10-CM

## 2019-01-17 LAB — MICROALBUMIN, URINE WAIVED
Creatinine, Urine Waived: 50 mg/dL (ref 10–300)
Microalb, Ur Waived: 10 mg/L (ref 0–19)
Microalb/Creat Ratio: <30 mg/g

## 2019-01-17 LAB — LIPID PANEL PICCOLO, WAIVED
Chol/HDL Ratio Piccolo,Waive: 1.9 mg/dL
Cholesterol Piccolo, Waived: 118 mg/dL (ref <200)
HDL Chol Piccolo, Waived: 63 mg/dL (ref >59)
LDL Chol Calc Piccolo Waived: 41 mg/dL (ref <100)
Triglycerides Piccolo,Waived: 70 mg/dL (ref <150)
VLDL Chol Calc Piccolo,Waive: 14 mg/dL (ref <30)

## 2019-01-17 LAB — BAYER DCA HB A1C WAIVED: HB A1C (BAYER DCA - WAIVED): 7.2 % — ABNORMAL HIGH (ref <7.0)

## 2019-01-17 MED ORDER — CYANOCOBALAMIN 1000 MCG/ML IJ SOLN
1000.0000 ug | Freq: Once | INTRAMUSCULAR | Status: AC
  Administered 2019-01-17: 1000 ug via INTRAMUSCULAR

## 2019-01-17 MED ORDER — LISINOPRIL 5 MG PO TABS: 5.0000 mg | ORAL_TABLET | Freq: Every day | ORAL | 1 refills | Status: DC

## 2019-01-17 NOTE — Progress Notes (Signed)
BP 118/60    Pulse 83    Ht 4' 10.5" (1.486 m)    Wt 150 lb (68 kg)    LMP  (LMP Unknown)    SpO2 98%    BMI 30.82 kg/m    Subjective:    [name], female    DOB: [birth year], 68 y.o.   MRN: [id number]  HPI: Jordan Chan is a 68 y.o. female presenting on 01/17/2019 for comprehensive medical examination. Current medical complaints include:none  She currently lives with: self Menopausal Symptoms: no   DIABETES Started on Trulicity recently, taking 0.75 MG weekly.  Continues on Metformin 1000 MG BID and Tresiba 30 units at HS.  Working with CCM team and provider.  She reports feeling better with Trulicity.  Recent A1C in March 8.2%. Hypoglycemic episodes:no Polydipsia/polyuria: no Visual disturbance: no Chest pain: no Paresthesias: no Glucose Monitoring: yes  Accucheck frequency: TID  Fasting glucose: average 119 in morning, 86 - 162  Post prandial: average 177, 129 to 235  Evening: 106 to 162  Before meals: Taking Insulin?: yes  Long acting insulin: Tresiba 30 units, sometimes she reports she does not take it (does not take it at least twice week)  Short acting insulin: Blood Pressure Monitoring: rarely Retinal Examination: Up to Date Foot Exam: Up to Date Pneumovax: Up to Date Influenza: Up to Date Aspirin: yes   HYPERTENSION / HYPERLIPIDEMIA Continues on Atorvastatin 20 MG, Lisinopril 5 MG, Metoprolol 12.5 MG daily + ASA. Satisfied with current treatment? yes Duration of hypertension: chronic BP monitoring frequency: rarely BP range: 110-130/70-80 BP medication side effects: no Duration of hyperlipidemia: chronic Cholesterol medication side effects: no Cholesterol supplements: none Medication compliance: good compliance Aspirin: yes Recent stressors: no Recurrent headaches: no Visual changes: no Palpitations: no Dyspnea: no Chest pain: no Lower extremity edema: no Dizzy/lightheaded: no   DEPRESSION Continues on Effexor 150 MG  daily Mood status: controlled Satisfied with current treatment?: no Symptom severity: mild  Duration of current treatment : chronic Side effects: no Medication compliance: good compliance Psychotherapy/counseling: none Depressed mood: no Anxious mood: no Anhedonia: no Significant weight loss or gain: no Insomnia: none Fatigue: no Feelings of worthlessness or guilt: no Impaired concentration/indecisiveness: no Suicidal ideations: no Hopelessness: no Crying spells: no   GERD Continues on Protonix 40 MG BID. GERD control status: stableSatisfied with current treatment? yes Heartburn frequency:  Medication side effects: no  Medication compliance: stable Previous GERD medications: Antacid use frequency:   Duration:  Nature:  Location:  Heartburn duration:  Alleviatiating factors:   Aggravating factors:  Dysphagia: no Odynophagia:  no Hematemesis: no Blood in stool: no EGD: no   COPD Continues on Spiriva 18 MCG daily and Advair 1 puff BID + Albuterol as needed.  Sees pulmonary and reports "everything is stable". Saw [name]. Saw him last April and spirometry done. COPD status: stable Satisfied with current treatment?: yes Oxygen use: no Dyspnea frequency: at baseline, intermittent Cough frequency: intermittent Rescue inhaler frequency:   Limitation of activity: no Productive cough: none Last Spirometry: April 2020 Pneumovax: Up to Date Influenza: Up to Date   CHRONIC PAIN: Goes to chronic pain clinic in [place]. Last seen 11/11/2018.  Has Morphine pump.  Sees them every 2-3 months.  Next visit is in a week.  Reports has ongoing pain, but is at her baseline and not worse.   Depression Screen done today and results listed below:  Depression screen [facility] 2/9 01/17/2019 01/17/2019 12/06/2018 07/15/2018 02/05/2018  Decreased Interest 0 0 0 0 0  Down, Depressed, Hopeless 0 0 0 0 1  PHQ - 2 Score 0 0 0 0 1  Altered sleeping 3 3 - 3 2  Tired, decreased energy 2 2 - 3  2  Change in appetite 0 0 - 0 0  Feeling bad or failure about yourself  0 0 - 0 0  Trouble concentrating 0 0 - 0 1  Moving slowly or fidgety/restless 0 0 - 0 0  Suicidal thoughts 0 0 - 0 0  PHQ-9 Score 5 5 - 6 6  Difficult doing work/chores - Not difficult at all - Not difficult at all -  Some recent data might be hidden   GAD 7 : Generalized Anxiety Score 01/17/2019 01/17/2019  Nervous, Anxious, on Edge 1 1  Control/stop worrying 0 0  Worry too much - different things 1 1  Trouble relaxing 1 1  Restless 0 0  Easily annoyed or irritable 1 1  Afraid - awful might happen 0 0  Total GAD 7 Score 4 4  Anxiety Difficulty Not difficult at all Not difficult at all     The patient does not have a history of falls. I did not complete a risk assessment for falls. A plan of care for falls was not documented.   Past Medical History:  Past Medical History:  Diagnosis Date   Arthritis    Asthma    Breast cancer ([place]) 1998   right breast ca with mastectomy and chemotherapy and radiation   COPD (chronic obstructive pulmonary disease) ([place])    Diabetes mellitus without complication (HCC)    Endometriosis    GERD (gastroesophageal reflux disease)    IBS (irritable bowel syndrome)    Low back pain    a. Implanted morphine/bupivicaine/clonidine pump.   Personal history of chemotherapy    Personal history of radiation therapy    Sleep apnea     Surgical History:  Past Surgical History:  Procedure Laterality Date   ABDOMINAL HYSTERECTOMY  1987   BACK SURGERY     Tailbone removed following fracture   CARDIAC CATHETERIZATION     COLONOSCOPY WITH PROPOFOL N/A 01/01/2017   Procedure: COLONOSCOPY WITH PROPOFOL;  Surgeon: Jonathon Bellows, MD;  Location: [facility] ENDOSCOPY;  Service: Endoscopy;  Laterality: N/A;   ELBOW ARTHROSCOPY WITH TENDON RECONSTRUCTION     ESOPHAGOGASTRODUODENOSCOPY (EGD) WITH PROPOFOL N/A 01/01/2017   Procedure: ESOPHAGOGASTRODUODENOSCOPY (EGD) WITH PROPOFOL;   Surgeon: Jonathon Bellows, MD;  Location[facility] END[facility]COPY;  Service: Endoscopy;  Laterality: N/A;   LEFT HEART CATH AND CORONARY ANGIOGRAPHY N/A 04/28/2017   Procedure: LEFT HEART CATH AND CORONARY ANGIOGRAPHY;  Surgeon: [name], MD;  Location: [facility] CV LAB;  Service: Cardiovascular;  Laterality: N/A;   MASTECTOMY  06/1997   morphine pump  2011   PLANTAR FASCIA RELEASE      Medications:  Current Outpatient Medications on File Prior to Visit  Medication Sig   Accu-Chek Softclix Lancets lancets TEST BLOOD SUGAR THREE TIMES DAILY   albuterol (PROVENTIL HFA;VENTOLIN HFA) 108 (90 BASE) MCG/ACT inhaler Inhale 2 puffs into the lungs every 6 (six) hours as needed for wheezing or shortness of breath.   aspirin EC 81 MG tablet Take 81 mg by mouth daily.   atorvastatin (LIPITOR) 20 MG tablet TAKE 1 TABLET EVERY DAY   cyclobenzaprine (FLEXERIL) 5 MG tablet Take 5 mg by mouth 2 (two) times daily as needed.   diclofenac (VOLTAREN) 50 MG EC tablet TAKE 1 TABLET (  50 MG TOTAL) BY MOUTH 3 (THREE) TIMES DAILY.   Dulaglutide (TRULICITY) 1.93 XT/0.2IO SOPN Inject 0.75 mg into the skin once a week.   Fluticasone-Salmeterol (ADVAIR DISKUS) 250-50 MCG/DOSE AEPB Inhale 1 puff into the lungs 2 (two) times daily.   gabapentin (NEURONTIN) 800 MG tablet Take 1 tablet (800 mg total) by mouth 3 (three) times daily.   glucose blood (ACCU-CHEK AVIVA PLUS) test strip TEST THREE TIMES DAILY   insulin degludec (TRESIBA FLEXTOUCH) 100 UNIT/ML SOPN FlexTouch Pen Inject 0.3 mLs (30 Units total) into the skin at bedtime.   metFORMIN (GLUCOPHAGE) 1000 MG tablet TAKE 1 TABLET TWICE DAILY WITH MEALS   metoprolol succinate (TOPROL-XL) 25 MG 24 hr tablet TAKE 1/2 TABLET EVERY DAY   pantoprazole (PROTONIX) 40 MG tablet Take 1 tablet (40 mg total) by mouth 2 (two) times daily.   tiotropium (SPIRIVA HANDIHALER) 18 MCG inhalation capsule Place 18 mcg into inhaler and inhale daily.   venlafaxine XR  (EFFEXOR-XR) 150 MG 24 hr capsule TAKE 1 CAPSULE EVERY DAY   vitamin B-12 (CYANOCOBALAMIN) 1000 MCG tablet Take 1,000 mcg by mouth daily.   No current facility-administered medications on file prior to visit.     Allergies:  Allergies  Allergen Reactions   Other Palpitations    IV steroids   Pain Patch [Menthol] Anaphylaxis   Avelox [Moxifloxacin Hcl In Nacl] Other (See Comments)    Upset stomach   Doxycycline Diarrhea   Erythromycin Nausea Only and Other (See Comments)    Can take a Z-Pak just fine   Fentanyl    Oxycontin [Oxycodone] Hives   Ozempic [Semaglutide] Nausea Only    Social History:  Social History   Socioeconomic History   Marital status: Legally Separated    Spouse name: Not on file   Number of children: Not on file   Years of education: Not on file   Highest education level: High school graduate  Occupational History   Occupation: retired  Scientist, product/process development strain: Not hard at all   Food insecurity    Worry: Never true    Inability: Never true   Transportation needs    Medical: No    Non-medical: No  Tobacco Use   Smoking status: Former Smoker    Packs/day: 1.00    Years: 30.00    Pack years: 30.00    Types: Cigarettes    Quit date: 04/30/2004    Years since quitting: 14.7   Smokeless tobacco: Never Used  Substance and Sexual Activity   Alcohol use: No   Drug use: No   Sexual activity: Not Currently    Birth control/protection: Post-menopausal  Lifestyle   Physical activity    Days per week: 0 days    Minutes per session: 0 min   Stress: Not at all  Relationships   Social connections    Talks on phone: More than three times a week    Gets together: More than three times a week    Attends religious service: More than 4 times per year    Active member of club or organization: No    Attends meetings of clubs or organizations: Never    Relationship status: Separated   Intimate partner violence     Fear of current or ex partner: No    Emotionally abused: No    Physically abused: No    Forced sexual activity: No  Other Topics Concern   Not on file  Social History Narrative  Not on file   Social History   Tobacco Use  Smoking Status Former Smoker   Packs/day: 1.00   Years: 30.00   Pack years: 30.00   Types: Cigarettes   Quit date: 04/30/2004   Years since quitting: 14.7  Smokeless Tobacco Never Used   Social History   Substance and Sexual Activity  Alcohol Use No    Family History:  Family History  Problem Relation Age of Onset   Cancer Mother 89       lung   Thyroid disease Mother    Cancer Father 44       Pancreatic   Hypertension Sister    Cancer Sister        "cancer on face"   Hyperlipidemia Sister    Osteoporosis Maternal Grandmother    Hyperlipidemia Son    Seizures Son    Cancer Paternal Grandmother        colon   Arthritis Paternal Grandfather    Breast cancer Neg Hx     Past medical history, surgical history, medications, allergies, family history and social history reviewed with patient today and changes made to appropriate areas of the chart.   Review of Systems - negative All other ROS negative except what is listed above and in the HPI.      Objective:    BP 118/60    Pulse 83    Ht 4' 10.5" (1.486 m)    Wt 150 lb (68 kg)    LMP  (LMP Unknown)    SpO2 98%    BMI 30.82 kg/m   Wt Readings from Last 3 Encounters:  01/17/19 150 lb (68 kg)  12/20/18 145 lb (65.8 kg)  12/06/18 145 lb (65.8 kg)    Physical Exam Vitals signs and nursing note reviewed.  Constitutional:      General: She is awake.     Appearance: She is well-developed.  HENT:     Head: Normocephalic.     Right Ear: Hearing, tympanic membrane, ear canal and external ear normal.     Left Ear: Hearing, tympanic membrane, ear canal and external ear normal.     Nose: Nose normal.     Mouth/Throat:     Mouth: Mucous membranes are moist.  Eyes:      General: Lids are normal.        Right eye: No discharge.        Left eye: No discharge.     Extraocular Movements: Extraocular movements intact.     Conjunctiva/sclera: Conjunctivae normal.     Pupils: Pupils are equal, round, and reactive to light.     Visual Fields: Right eye visual fields normal and left eye visual fields normal.  Neck:     Musculoskeletal: Normal range of motion and neck supple.     Thyroid: No thyromegaly.     Vascular: No carotid bruit or JVD.  Cardiovascular:     Rate and Rhythm: Normal rate and regular rhythm.     Heart sounds: Normal heart sounds. No murmur. No gallop.   Pulmonary:     Effort: Pulmonary effort is normal.     Breath sounds: Normal breath sounds.  Chest:     Comments: Deferred per patient request.   Abdominal:     General: Bowel sounds are normal.     Palpations: Abdomen is soft. There is no hepatomegaly or splenomegaly.  Musculoskeletal:     Right lower leg: No edema.     Left lower leg: No  edema.  Lymphadenopathy:     Cervical: No cervical adenopathy.  Skin:    General: Skin is warm and dry.  Neurological:     Mental Status: She is alert and oriented to person, place, and time.  Psychiatric:        Attention and Perception: Attention normal.        Mood and Affect: Mood normal.        Behavior: Behavior normal. Behavior is cooperative.        Thought Content: Thought content normal.        Judgment: Judgment normal.     Results for orders placed or performed in visit on 10/14/18  Bayer DCA Hb A1c Waived  Result Value Ref Range   HB A1C (BAYER DCA - WAIVED) 8.2 (H) <7.0 %      Assessment & Plan:   Problem List Items Addressed This Visit      Cardiovascular and Mediastinum   Essential hypertension    Chronic, stable.  BP remains below goal.  Continue current medication regimen.  CMP and CBC today.      Relevant Medications   lisinopril (ZESTRIL) 5 MG tablet   Other Relevant Orders   CBC with Differential/Platelet       Respiratory   COPD, moderate (HCC)    Chronic, stable.  Continue current medication regimen and collaboration with pulmonary.          Digestive   GERD (gastroesophageal reflux disease)    Chronic, stable.  Continue current medication regimen.  Mag level ordered.      Relevant Orders   Magnesium     Endocrine   Hyperlipidemia associated with type 2 diabetes mellitus (HCC)    Chronic, stable.  LDL 41 and TCHOL 118.  Continue current medication regimen.  CMP today.        Relevant Medications   lisinopril (ZESTRIL) 5 MG tablet   Other Relevant Orders   Lipid Panel [place], Waived   Comprehensive metabolic panel     Nervous and Auditory   Type 2 diabetes mellitus with diabetic neuropathy, unspecified (HCC)    Chronic, ongoing with A1C 7.2% today. Urine ALB 10 and A:C <30. Congratulated patient on reduction, almost at goal <7.  Continue current medication regimen and monitoring BS closely.  Return in 3 months.         Relevant Medications   lisinopril (ZESTRIL) 5 MG tablet   Other Relevant Orders   Bayer DCA Hb A1c Waived   Microalbumin, Urine Waived     Other   Depression, major, single episode, in partial remission (HCC)    Chronic, stable.  Continue current medication regimen and monitor BP.        B12 deficiency    B12 level today.  B12 injection provided.      Relevant Orders   Vitamin B12   CBC with Differential/Platelet   Pain syndrome, chronic    Continue to collaborate with chronic pain clinic in [facility].  Has morphine pump.       Other Visit Diagnoses    Encounter for annual physical exam    -  Primary   Relevant Medications   cyanocobalamin ((VITAMIN B-12)) injection 1,000 mcg (Completed)       Follow up plan: Return in about 3 months (around 04/19/2019) for T2DM, HTN/HLD.   LABORATORY TESTING:  - Pap smear: not applicable  IMMUNIZATIONS:   - Tdap: Tetanus vaccination status reviewed: last tetanus booster within 10 years. -  Influenza:  Up to date - Pneumovax: Up to date - Prevnar: Up to date - HPV: Not applicable - Zostavax vaccine: Up to date  SCREENING: -Mammogram: Up to date  - Colonoscopy: Up to date  - Bone Density: Up to date  -Hearing Test: Not applicable  -Spirometry: Up to date   PATIENT COUNSELING:   Advised to take 1 mg of folate supplement per day if capable of pregnancy.   Sexuality: Discussed sexually transmitted diseases, partner selection, use of condoms, avoidance of unintended pregnancy  and contraceptive alternatives.   Advised to avoid cigarette smoking.  I discussed with the patient that most people either abstain from alcohol or drink within safe limits (<=14/week and <=4 drinks/occasion for males, <=7/weeks and <= 3 drinks/occasion for females) and that the risk for alcohol disorders and other health effects rises proportionally with the number of drinks per week and how often a drinker exceeds daily limits.  Discussed cessation/primary prevention of drug use and availability of treatment for abuse.   Diet: Encouraged to adjust caloric intake to maintain  or achieve ideal body weight, to reduce intake of dietary saturated fat and total fat, to limit sodium intake by avoiding high sodium foods and not adding table salt, and to maintain adequate dietary potassium and calcium preferably from fresh fruits, vegetables, and low-fat dairy products.    stressed the importance of regular exercise  Injury prevention: Discussed safety belts, safety helmets, smoke detector, smoking near bedding or upholstery.   Dental health: Discussed importance of regular tooth brushing, flossing, and dental visits.    NEXT PREVENTATIVE PHYSICAL DUE IN 1 YEAR. Return in about 3 months (around 04/19/2019) for T2DM, HTN/HLD.

## 2019-01-17 NOTE — Assessment & Plan Note (Signed)
Chronic, stable.  Continue current medication regimen.  Mag level ordered.

## 2019-01-17 NOTE — Assessment & Plan Note (Signed)
Chronic, stable.  Continue current medication regimen and monitor BP.

## 2019-01-17 NOTE — Assessment & Plan Note (Signed)
Chronic, stable.  LDL 41 and TCHOL 118.  Continue current medication regimen.  CMP today.

## 2019-01-17 NOTE — Assessment & Plan Note (Signed)
Chronic, stable.  BP remains below goal.  Continue current medication regimen.  CMP and CBC today.

## 2019-01-17 NOTE — Assessment & Plan Note (Signed)
Continue to collaborate with chronic pain clinic in [place].  Has morphine pump.   

## 2019-01-17 NOTE — Assessment & Plan Note (Signed)
Chronic, ongoing with A1C 7.2% today. Urine ALB 10 and A:C <30. Congratulated patient on reduction, almost at goal <7.  Continue current medication regimen and monitoring BS closely.  Return in 3 months.

## 2019-01-17 NOTE — Patient Instructions (Addendum)
Carbohydrate Counting for Diabetes Mellitus, Adult  Carbohydrate counting is a method of keeping track of how many carbohydrates you eat. Eating carbohydrates naturally increases the amount of sugar (glucose) in the blood. Counting how many carbohydrates you eat helps keep your blood glucose within normal limits, which helps you manage your diabetes (diabetes mellitus). It is important to know how many carbohydrates you can safely have in each meal. This is different for every person. A diet and nutrition specialist (registered dietitian) can help you make a meal plan and calculate how many carbohydrates you should have at each meal and snack. Carbohydrates are found in the following foods:  Grains, such as breads and cereals.  Dried beans and soy products.  Starchy vegetables, such as potatoes, peas, and corn.  Fruit and fruit juices.  Milk and yogurt.  Sweets and snack foods, such as cake, cookies, candy, chips, and soft drinks. How do I count carbohydrates? There are two ways to count carbohydrates in food. You can use either of the methods or a combination of both. Reading "Nutrition Facts" on packaged food The "Nutrition Facts" list is included on the labels of almost all packaged foods and beverages in the [place] It includes:  The serving size.  Information about nutrients in each serving, including the grams (g) of carbohydrate per serving. To use the Nutrition Facts":  Decide how many servings you will have.  Multiply the number of servings by the number of carbohydrates per serving.  The resulting number is the total amount of carbohydrates that you will be having. Learning standard serving sizes of other foods When you eat carbohydrate foods that are not packaged or do not include "Nutrition Facts" on the label, you need to measure the servings in order to count the amount of carbohydrates:  Measure the foods that you will eat with a food scale or measuring cup, if  needed.  Decide how many standard-size servings you will eat.  Multiply the number of servings by 15. Most carbohydrate-rich foods have about 15 g of carbohydrates per serving. ? For example, if you eat 8 oz (170 g) of strawberries, you will have eaten 2 servings and 30 g of carbohydrates (2 servings x 15 g = 30 g).  For foods that have more than one food mixed, such as soups and casseroles, you must count the carbohydrates in each food that is included. The following list contains standard serving sizes of common carbohydrate-rich foods. Each of these servings has about 15 g of carbohydrates:   hamburger bun or  English muffin.   oz (15 mL) syrup.   oz (14 g) jelly.  1 slice of bread.  1 six-inch tortilla.  3 oz (85 g) cooked rice or pasta.  4 oz (113 g) cooked dried beans.  4 oz (113 g) starchy vegetable, such as peas, corn, or potatoes.  4 oz (113 g) hot cereal.  4 oz (113 g) mashed potatoes or  of a large baked potato.  4 oz (113 g) canned or frozen fruit.  4 oz (120 mL) fruit juice.  4-6 crackers.  6 chicken nuggets.  6 oz (170 g) unsweetened dry cereal.  6 oz (170 g) plain fat-free yogurt or yogurt sweetened with artificial sweeteners.  8 oz (240 mL) milk.  8 oz (170 g) fresh fruit or one small piece of fruit.  24 oz (680 g) popped popcorn. Example of carbohydrate counting Sample meal  3 oz (85 g) chicken breast.  6 oz (170 g)  brown rice.  4 oz (113 g) corn.  8 oz (240 mL) milk.  8 oz (170 g) strawberries with sugar-free whipped topping. Carbohydrate calculation 1. Identify the foods that contain carbohydrates: ? Rice. ? Corn. ? Milk. ? Strawberries. 2. Calculate how many servings you have of each food: ? 2 servings rice. ? 1 serving corn. ? 1 serving milk. ? 1 serving strawberries. 3. Multiply each number of servings by 15 g: ? 2 servings rice x 15 g = 30 g. ? 1 serving corn x 15 g = 15 g. ? 1 serving milk x 15 g = 15 g. ? 1  serving strawberries x 15 g = 15 g. 4. Add together all of the amounts to find the total grams of carbohydrates eaten: ? 30 g + 15 g + 15 g + 15 g = 75 g of carbohydrates total. Summary  Carbohydrate counting is a method of keeping track of how many carbohydrates you eat.  Eating carbohydrates naturally increases the amount of sugar (glucose) in the blood.  Counting how many carbohydrates you eat helps keep your blood glucose within normal limits, which helps you manage your diabetes.  A diet and nutrition specialist (registered dietitian) can help you make a meal plan and calculate how many carbohydrates you should have at each meal and snack. This information is not intended to replace advice given to you by your health care provider. Make sure you discuss any questions you have with your health care provider. Document Released: 07/21/2005 Document Revised: 01/28/2017 Document Reviewed: 01/02/2016 Elsevier Interactive Patient Education  2019 [place] (Tetanus and Diphtheria): What You Need to Know 1. Why get vaccinated? Tetanus  and diphtheria are very serious diseases. They are rare in the [place] today, but people who do become infected often have severe complications. Td vaccine is used to protect adolescents and adults from both of these diseases. Both tetanus and diphtheria are infections caused by bacteria. Diphtheria spreads from person to person through coughing or sneezing. Tetanus-causing bacteria enter the body through cuts, scratches, or wounds. TETANUS (Lockjaw) causes painful muscle tightening and stiffness, usually all over the body.  It can lead to tightening of muscles in the head and neck so you can't open your mouth, swallow, or sometimes even breathe. Tetanus kills about 1 out of every 10 people who are infected even after receiving the best medical care. DIPHTHERIA can cause a thick coating to form in the back of the throat.  It can lead to  breathing problems, paralysis, heart failure, and death. Before vaccines, as many as 200,000 cases of diphtheria and hundreds of cases of tetanus were reported in the [place] each year. Since vaccination began, reports of cases for both diseases have dropped by about 99%. 2. Td vaccine Td vaccine can protect adolescents and adults from tetanus and diphtheria. Td is usually given as a booster dose every 10 years but it can also be given earlier after a severe and dirty wound or burn. Another vaccine, called Tdap, which protects against pertussis in addition to tetanus and diphtheria, is sometimes recommended instead of Td vaccine. Your doctor or the person giving you the vaccine can give you more information. Td may safely be given at the same time as other vaccines. 3. Some people should not get this vaccine  A person who has ever had a life-threatening allergic reaction after a previous dose of any tetanus or diphtheria containing vaccine, OR has a severe allergy to  any part of this vaccine, should not get Td vaccine. Tell the person giving the vaccine about any severe allergies.  Talk to your doctor if you: ? had severe pain or swelling after any vaccine containing diphtheria or tetanus, ? ever had a condition called Guillain Barr Syndrome (GBS), ? aren't feeling well on the day the shot is scheduled. 4. Risks of a vaccine reaction With any medicine, including vaccines, there is a chance of side effects. These are usually mild and go away on their own. Serious reactions are also possible but are rare. Most people who get Td vaccine do not have any problems with it. Mild Problems following Td vaccine: (Did not interfere with activities)  Pain where the shot was given (about 8 people in 10)  Redness or swelling where the shot was given (about 1 person in 4)  Mild fever (rare)  Headache (about 1 person in 4)  Tiredness (about 1 person in 4) Moderate Problems following Td  vaccine: (Interfered with activities, but did not require medical attention)  Fever over 102F (rare) Severe Problems following Td vaccine: (Unable to perform usual activities; required medical attention)  Swelling, severe pain, bleeding and/or redness in the arm where the shot was given (rare). Problems that could happen after any vaccine:  People sometimes faint after a medical procedure, including vaccination. Sitting or lying down for about 15 minutes can help prevent fainting, and injuries caused by a fall. Tell your doctor if you feel dizzy, or have vision changes or ringing in the ears.  Some people get severe pain in the shoulder and have difficulty moving the arm where a shot was given. This happens very rarely.  Any medication can cause a severe allergic reaction. Such reactions from a vaccine are very rare, estimated at fewer than 1 in a million doses, and would happen within a few minutes to a few hours after the vaccination. As with any medicine, there is a very remote chance of a vaccine causing a serious injury or death. The safety of vaccines is always being monitored. For more information, visit: http://www.aguilar.org/ 5. What if there is a serious reaction? What should I look for?  Look for anything that concerns you, such as signs of a severe allergic reaction, very high fever, or unusual behavior. Signs of a severe allergic reaction can include hives, swelling of the face and throat, difficulty breathing, a fast heartbeat, dizziness, and weakness. These would usually start a few minutes to a few hours after the vaccination. What should I do?  If you think it is a severe allergic reaction or other emergency that can't wait, call 9-1-1 or get the person to the nearest hospital. Otherwise, call your doctor.  Afterward, the reaction should be reported to the Vaccine Adverse Event Reporting System (VAERS). Your doctor might file this report, or you can do it yourself  through the VAERS web site at www.vaers.SamedayNews.es, or by calling 505 025 6852. VAERS does not give medical advice. 6. The National Vaccine Injury Compensation Program The [employer] Vaccine Injury Compensation Program (VICP) is a federal program that was created to compensate people who may have been injured by certain vaccines. Persons who believe they may have been injured by a vaccine can learn about the program and about filing a claim by calling 661 810 0464 or visiting the [place] website at GoldCloset.com.ee. There is a time limit to file a claim for compensation. 7. How can I learn more?  Ask your doctor. He or she can give you  the vaccine package insert or suggest other sources of information.  Call your local or state health department.  Contact the Centers for Disease Control and Prevention (CDC): ? Call (707)050-6431 (1-800-CDC-INFO) ? Visit CDC's website at http://hunter.com/ Vaccine Information Statement Td Vaccine (11/13/15) This information is not intended to replace advice given to you by your health care provider. Make sure you discuss any questions you have with your health care provider. Document Released: 05/18/2006 Document Revised: 03/08/2018 Document Reviewed: 03/08/2018 Elsevier Interactive Patient Education  2019 [place] (Tetanus and Diphtheria): What You Need to Know 1. Why get vaccinated? Tetanus  and diphtheria are very serious diseases. They are rare in the [place] today, but people who do become infected often have severe complications. Td vaccine is used to protect adolescents and adults from both of these diseases. Both tetanus and diphtheria are infections caused by bacteria. Diphtheria spreads from person to person through coughing or sneezing. Tetanus-causing bacteria enter the body through cuts, scratches, or wounds. TETANUS (Lockjaw) causes painful muscle tightening and stiffness, usually all over the body.  It can  lead to tightening of muscles in the head and neck so you can't open your mouth, swallow, or sometimes even breathe. Tetanus kills about 1 out of every 10 people who are infected even after receiving the best medical care. DIPHTHERIA can cause a thick coating to form in the back of the throat.  It can lead to breathing problems, paralysis, heart failure, and death. Before vaccines, as many as 200,000 cases of diphtheria and hundreds of cases of tetanus were reported in the [place] each year. Since vaccination began, reports of cases for both diseases have dropped by about 99%. 2. Td vaccine Td vaccine can protect adolescents and adults from tetanus and diphtheria. Td is usually given as a booster dose every 10 years but it can also be given earlier after a severe and dirty wound or burn. Another vaccine, called Tdap, which protects against pertussis in addition to tetanus and diphtheria, is sometimes recommended instead of Td vaccine. Your doctor or the person giving you the vaccine can give you more information. Td may safely be given at the same time as other vaccines. 3. Some people should not get this vaccine  A person who has ever had a life-threatening allergic reaction after a previous dose of any tetanus or diphtheria containing vaccine, OR has a severe allergy to any part of this vaccine, should not get Td vaccine. Tell the person giving the vaccine about any severe allergies.  Talk to your doctor if you: ? had severe pain or swelling after any vaccine containing diphtheria or tetanus, ? ever had a condition called Guillain Barr Syndrome (GBS), ? aren't feeling well on the day the shot is scheduled. 4. Risks of a vaccine reaction With any medicine, including vaccines, there is a chance of side effects. These are usually mild and go away on their own. Serious reactions are also possible but are rare. Most people who get Td vaccine do not have any problems with it. Mild Problems  following Td vaccine: (Did not interfere with activities)  Pain where the shot was given (about 8 people in 10)  Redness or swelling where the shot was given (about 1 person in 4)  Mild fever (rare)  Headache (about 1 person in 4)  Tiredness (about 1 person in 4) Moderate Problems following Td vaccine: (Interfered with activities, but did not require medical attention)  Fever over 102F (rare)  Severe Problems following Td vaccine: (Unable to perform usual activities; required medical attention)  Swelling, severe pain, bleeding and/or redness in the arm where the shot was given (rare). Problems that could happen after any vaccine:  People sometimes faint after a medical procedure, including vaccination. Sitting or lying down for about 15 minutes can help prevent fainting, and injuries caused by a fall. Tell your doctor if you feel dizzy, or have vision changes or ringing in the ears.  Some people get severe pain in the shoulder and have difficulty moving the arm where a shot was given. This happens very rarely.  Any medication can cause a severe allergic reaction. Such reactions from a vaccine are very rare, estimated at fewer than 1 in a million doses, and would happen within a few minutes to a few hours after the vaccination. As with any medicine, there is a very remote chance of a vaccine causing a serious injury or death. The safety of vaccines is always being monitored. For more information, visit: http://www.aguilar.org/ 5. What if there is a serious reaction? What should I look for?  Look for anything that concerns you, such as signs of a severe allergic reaction, very high fever, or unusual behavior. Signs of a severe allergic reaction can include hives, swelling of the face and throat, difficulty breathing, a fast heartbeat, dizziness, and weakness. These would usually start a few minutes to a few hours after the vaccination. What should I do?  If you think it is a  severe allergic reaction or other emergency that can't wait, call 9-1-1 or get the person to the nearest hospital. Otherwise, call your doctor.  Afterward, the reaction should be reported to the Vaccine Adverse Event Reporting System (VAERS). Your doctor might file this report, or you can do it yourself through the VAERS web site at www.vaers.SamedayNews.es, or by calling 8311015747. VAERS does not give medical advice. 6. The National Vaccine Injury Compensation Program The [employer] Vaccine Injury Compensation Program (VICP) is a federal program that was created to compensate people who may have been injured by certain vaccines. Persons who believe they may have been injured by a vaccine can learn about the program and about filing a claim by calling 206 806 9810 or visiting the [place] website at GoldCloset.com.ee. There is a time limit to file a claim for compensation. 7. How can I learn more?  Ask your doctor. He or she can give you the vaccine package insert or suggest other sources of information.  Call your local or state health department.  Contact the Centers for Disease Control and Prevention (CDC): ? Call 806-541-4575 (1-800-CDC-INFO) ? Visit CDC's website at http://hunter.com/ Vaccine Information Statement Td Vaccine (11/13/15) This information is not intended to replace advice given to you by your health care provider. Make sure you discuss any questions you have with your health care provider. Document Released: 05/18/2006 Document Revised: 03/08/2018 Document Reviewed: 03/08/2018 Elsevier Interactive Patient Education  2019 [employer].

## 2019-01-17 NOTE — Assessment & Plan Note (Signed)
B12 level today.  B12 injection provided.

## 2019-01-17 NOTE — Assessment & Plan Note (Signed)
Chronic, stable.  Continue current medication regimen and collaboration with pulmonary.

## 2019-01-17 NOTE — Assessment & Plan Note (Signed)
Recommend continued focus on health diet choices and regular physical activity (30 minutes 5 days a week). 

## 2019-01-18 LAB — CBC WITH DIFFERENTIAL/PLATELET
Basophils Absolute: 0.1 10*3/uL (ref 0.0–0.2)
Basos: 1 %
EOS (ABSOLUTE): 0.3 10*3/uL (ref 0.0–0.4)
Eos: 3 %
Hematocrit: 33.2 % — ABNORMAL LOW (ref 34.0–46.6)
Hemoglobin: 10.9 g/dL — ABNORMAL LOW (ref 11.1–15.9)
Immature Grans (Abs): 0 10*3/uL (ref 0.0–0.1)
Immature Granulocytes: 0 %
Lymphocytes Absolute: 3.3 10*3/uL — ABNORMAL HIGH (ref 0.7–3.1)
Lymphs: 32 %
MCH: 28.4 pg (ref 26.6–33.0)
MCHC: 32.8 g/dL (ref 31.5–35.7)
MCV: 87 fL (ref 79–97)
Monocytes Absolute: 1 10*3/uL — ABNORMAL HIGH (ref 0.1–0.9)
Monocytes: 9 %
Neutrophils Absolute: 5.5 10*3/uL (ref 1.4–7.0)
Neutrophils: 55 %
Platelets: 431 10*3/uL (ref 150–450)
RBC: 3.84 x10E6/uL (ref 3.77–5.28)
RDW: 15.1 % (ref 11.7–15.4)
WBC: 10.2 10*3/uL (ref 3.4–10.8)

## 2019-01-18 LAB — COMPREHENSIVE METABOLIC PANEL
ALT: 10 IU/L (ref 0–32)
AST: 12 IU/L (ref 0–40)
Albumin/Globulin Ratio: 1.3 (ref 1.2–2.2)
Albumin: 4 g/dL (ref 3.8–4.8)
Alkaline Phosphatase: 64 IU/L (ref 39–117)
BUN/Creatinine Ratio: 13 (ref 12–28)
BUN: 10 mg/dL (ref 8–27)
Bilirubin Total: 0.2 mg/dL (ref 0.0–1.2)
CO2: 27 mmol/L (ref 20–29)
Calcium: 9.3 mg/dL (ref 8.7–10.3)
Chloride: 100 mmol/L (ref 96–106)
Creatinine, Ser: 0.77 mg/dL (ref 0.57–1.00)
GFR calc Af Amer: 92 mL/min/{1.73_m2} (ref >59)
GFR calc non Af Amer: 80 mL/min/{1.73_m2} (ref >59)
Globulin, Total: 3.2 g/dL (ref 1.5–4.5)
Glucose: 93 mg/dL (ref 65–99)
Potassium: 4.8 mmol/L (ref 3.5–5.2)
Sodium: 138 mmol/L (ref 134–144)
Total Protein: 7.2 g/dL (ref 6.0–8.5)

## 2019-01-18 LAB — VITAMIN B12: Vitamin B-12: 524 pg/mL (ref 232–1245)

## 2019-01-25 ENCOUNTER — Ambulatory Visit (INDEPENDENT_AMBULATORY_CARE_PROVIDER_SITE_OTHER): Payer: Medicare HMO | Admitting: Pharmacist

## 2019-01-25 DIAGNOSIS — E1140 Type 2 diabetes mellitus with diabetic neuropathy, unspecified: Secondary | ICD-10-CM

## 2019-01-25 DIAGNOSIS — Z794 Long term (current) use of insulin: Secondary | ICD-10-CM

## 2019-01-25 NOTE — Patient Instructions (Signed)
Visit Information  Goals Addressed            This Visit's Progress     Patient Stated   . "I want to get my blood sugars under control" (pt-stated)       Current Barriers:  . Last A1c improved to 7.2% (01/2019). This is down from 8.2% (10/15/2018). Patient reports the following blood glucose levels:   fasting after dinner midnight    188 147 131   135 178 158   126  148   101  145   79  112   182  142   164  142   107  138   139     165    Average  130 162 139   . States that she has been adherent to Trulicity 7.56 mg once weekly (takes on Wednesdays) and metformin 1000 mg BID. States she is no longer experiencing GI symptoms on Trulicity.  . Reports occasionally skipping Tresiba 30 units dose (about once a week) if her blood glucose levels are below 130 because she worries about dropping too low.  . Patient denies blood glucose levels of <70 in the past month, but states she experiences hypoglycemic symptoms (nausea, nervousness, shakiness, confusion) when her blood glucose levels are below 130. Patient also reports she has passed out in the past when hypoglycemia, but that has not happened since. Patient reports keeping a Cutie orange next to her recliner so she will be able to have something to bring up her blood glucose levels without having to get up in fear of falling over when she is experiencing hypoglycemic symptoms.  . Dietary indiscretions - notes that she has a difficult time sleeping, so she stays up and eats snacks. Reports that she has made several dietary changes in her snacking habits including cutting out ice cream. She states that she will usually eat Nutella on one slice of bread, fruit (Cutie, banana), popsicle.   Pharmacist Clinical Goal(s):  [name] Over the next 30 days, patient will work with PharmD to address needs related to optimizing medication regimen    Interventions:  Recommend increasing Trulicity to 1.5 mg weekly to maximize non-insulin therapies and to  help further decrease insulin therapy in the future. Patient just received supply of 0.75 mg and will be able to use 2 pens until this supply runs out.   Consider decreasing dose of Tresiba to 25 units daily to help avoid hypoglycemic symptoms and to encourage patient to take regularly without skipping doses. Educated patient on the importance of adherence to medication and that she is not covered the day after skipping her dose of [place].   If she does not tolerate increased Trulicity dose, consider SGLT-2 inhibitor for future post prandial control with the added cardiovascular and renal benefits, as well as reduced insulin burden and reduced risk of hypoglycemia   Congratulated patient on improvement in A1c and well as positive changes made to her diet. Encouraged patinet to continue making positive changes in her diet including switching to whole wheat or thin slices of bread.   Patient Self Care Activities:  . Self administers medications as prescribed  . Patient will continue to check SMBG fasting and 2 hour post-prandial  . Patient will continue to make dietary changes and choose smaller portion sizes of medications that can cause hyperglycemia   Please see past updates related to this goal by clicking on the "Past Updates" button in the selected goal  The patient verbalized understanding of instructions provided today and declined a print copy of patient instruction materials.   Plan:  - PharmD will work with primary care provider on the above recommendations - PharmD will outreach patient in 4-5 weeks for continued medication management support   Catie Darnelle Maffucci, PharmD Clinical Pharmacist [place] [phone]

## 2019-01-25 NOTE — Chronic Care Management (AMB) (Signed)
Chronic Care Management   Follow Up Note   01/25/2019 Name: Jordan Chan MRN: [id number] DOB: [birth year]  Referred by: [name], NP Reason for referral : Chronic Care Management (Medication Management)   Jordan Chan is a 68 y.o. year old female who is a primary care patient of [name], [name], NP. The CCM team was consulted for assistance with chronic disease management and care coordination needs.    Contacted patient to follow up on medication management.   Review of patient status, including review of consultants reports, relevant laboratory and other test results, and collaboration with appropriate care team members and the patient's provider was performed as part of comprehensive patient evaluation and provision of chronic care management services.    Goals Addressed            This Visit's Progress     Patient Stated   . "I want to get my blood sugars under control" (pt-stated)       Current Barriers:  . Last A1c improved to 7.2% (01/2019). This is down from 8.2% (10/15/2018). Patient reports the following blood glucose levels:   fasting after dinner midnight    188 147 131   135 178 158   126  148   101  145   79  112   182  142   164  142   107  138   139     165    Average  130 162 139   . States that she has been adherent to Trulicity 4.09 mg once weekly (takes on Wednesdays) and metformin 1000 mg BID. States she is no longer experiencing GI symptoms on Trulicity.  . Reports occasionally skipping Tresiba 30 units dose (about once a week) if her blood glucose levels are below 130 because she worries about dropping too low.  . Patient denies blood glucose levels of <70 in the past month, but states she experiences hypoglycemic symptoms (nausea, nervousness, shakiness, confusion) when her blood glucose levels are below 130. Patient also reports she has passed out in the past when hypoglycemia, but that has not happened since. Patient reports  keeping a Cutie orange next to her recliner so she will be able to have something to bring up her blood glucose levels without having to get up in fear of falling over when she is experiencing hypoglycemic symptoms.  . Dietary indiscretions - notes that she has a difficult time sleeping, so she stays up and eats snacks. Reports that she has made several dietary changes in her snacking habits including cutting out ice cream. She states that she will usually eat Nutella on one slice of bread, fruit (Cutie, banana), popsicle.   Pharmacist Clinical Goal(s):  [name] Over the next 30 days, patient will work with PharmD to address needs related to optimizing medication regimen    Interventions:  Recommend increasing Trulicity to 1.5 mg weekly to maximize non-insulin therapies and to help further decrease insulin therapy in the future. Patient just received supply of 0.75 mg and will be able to use 2 pens until this supply runs out.   Consider decreasing dose of Tresiba to 25 units daily to help avoid hypoglycemic symptoms and to encourage patient to take regularly without skipping doses. Educated patient on the importance of adherence to medication and that she is not covered the day after skipping her dose of [place].   If she does not tolerate increased Trulicity dose, consider SGLT-2 inhibitor for future post prandial  control with the added cardiovascular and renal benefits, as well as reduced insulin burden and reduced risk of hypoglycemia   Congratulated patient on improvement in A1c and well as positive changes made to her diet. Encouraged patinet to continue making positive changes in her diet including switching to whole wheat or thin slices of bread.   Patient Self Care Activities:  . Self administers medications as prescribed  . Patient will continue to check SMBG fasting and 2 hour post-prandial  . Patient will continue to make dietary changes and choose smaller portion sizes of medications that  can cause hyperglycemia   Please see past updates related to this goal by clicking on the "Past Updates" button in the selected goal          Plan:  - PharmD will work with primary care provider on the above recommendations - PharmD will outreach patient in 4-5 weeks for continued medication management support   Catie Darnelle Maffucci, PharmD Clinical Pharmacist [place] [phone]

## 2019-01-26 DIAGNOSIS — M542 Cervicalgia: Secondary | ICD-10-CM | POA: Diagnosis not present

## 2019-01-26 DIAGNOSIS — G894 Chronic pain syndrome: Secondary | ICD-10-CM | POA: Diagnosis not present

## 2019-01-26 DIAGNOSIS — M5442 Lumbago with sciatica, left side: Secondary | ICD-10-CM | POA: Diagnosis not present

## 2019-01-26 DIAGNOSIS — G893 Neoplasm related pain (acute) (chronic): Secondary | ICD-10-CM | POA: Diagnosis not present

## 2019-01-28 ENCOUNTER — Other Ambulatory Visit: Payer: Self-pay | Admitting: Nurse Practitioner

## 2019-01-28 DIAGNOSIS — Z1231 Encounter for screening mammogram for malignant neoplasm of breast: Secondary | ICD-10-CM

## 2019-02-15 DIAGNOSIS — E119 Type 2 diabetes mellitus without complications: Secondary | ICD-10-CM | POA: Diagnosis not present

## 2019-02-15 LAB — HM DIABETES EYE EXAM

## 2019-02-18 ENCOUNTER — Other Ambulatory Visit: Payer: Self-pay | Admitting: Nurse Practitioner

## 2019-02-21 ENCOUNTER — Other Ambulatory Visit: Payer: Self-pay

## 2019-02-21 ENCOUNTER — Ambulatory Visit (INDEPENDENT_AMBULATORY_CARE_PROVIDER_SITE_OTHER): Payer: Medicare HMO

## 2019-02-21 DIAGNOSIS — E538 Deficiency of other specified B group vitamins: Secondary | ICD-10-CM | POA: Diagnosis not present

## 2019-02-21 MED ORDER — CYANOCOBALAMIN 1000 MCG/ML IJ SOLN
1000.0000 ug | INTRAMUSCULAR | Status: AC
  Administered 2019-02-21 – 2019-07-05 (×4): 1000 ug via INTRAMUSCULAR

## 2019-03-02 ENCOUNTER — Telehealth: Payer: Self-pay

## 2019-03-02 ENCOUNTER — Other Ambulatory Visit: Payer: Self-pay | Admitting: Nurse Practitioner

## 2019-03-02 ENCOUNTER — Ambulatory Visit (INDEPENDENT_AMBULATORY_CARE_PROVIDER_SITE_OTHER): Payer: Medicare HMO | Admitting: Pharmacist

## 2019-03-02 DIAGNOSIS — E1140 Type 2 diabetes mellitus with diabetic neuropathy, unspecified: Secondary | ICD-10-CM

## 2019-03-02 DIAGNOSIS — Z794 Long term (current) use of insulin: Secondary | ICD-10-CM | POA: Diagnosis not present

## 2019-03-02 MED ORDER — TRULICITY 1.5 MG/0.5ML ~~LOC~~ SOAJ: 1.5000 mg | SUBCUTANEOUS | 2 refills | Status: DC

## 2019-03-02 NOTE — Chronic Care Management (AMB) (Signed)
Chronic Care Management   Follow Up Note   03/02/2019 Name: Jordan Chan MRN: [id number] DOB: 28-Jul-1951  Referred by: [name], NP Reason for referral : Chronic Care Management (Medication Management)   Jordan Chan is a 68 y.o. year old female who is a primary care patient of [name], [name], NP. The CCM team was consulted for assistance with chronic disease management and care coordination needs.    Contacted patient to follow up on medication management.   Review of patient status, including review of consultants reports, relevant laboratory and other test results, and collaboration with appropriate care team members and the patient's provider was performed as part of comprehensive patient evaluation and provision of chronic care management services.    Goals Addressed            This Visit's Progress     Patient Stated   . "I want to get my blood sugars under control" (pt-stated)       Current Barriers:  . Diabetes: uncontrolled; most recent A1c 7.2%  o Notes that her glucometer needs new batteries, so she could not review readings with me today o Increased Trulicity (using 2 0.27 mg syringes) after our last phone call to see how her sugars responded, and decreased Tresiba from 30 to 25 units daily. Notes improvement in blood sugars with less episodes of symptomatic hypoglycemia. No concerns with recurrent GI upset or a negative impact on appetite o Endorses decline in her memory, sometimes forgets to check sugars, forgets words. She notes that she does word searches; dislikes reading. Plans to discuss with Jolene at upcoming appointment in September.  . Current antihyperglycemic regimen: Trulicity 1.5 mg (2 injection of 0.75 mg), Tresiba 25 units QAM, metformin 1000 mg BID . Reports reduction episodes of hypoglycemic symptoms.  . Current exercise: Has been doing some squats/stretches for exercise, pain limits walking  . Current meal patterns: o  Breakfast: skips o Lunch around 11 am: cracker barrel vegetable platter (fried okra, green beans; 1 cornbread muffin - cut back from 2 muffins); occasionally grilled chicken; has decreased from full sweet tea to almost 1/2 and 1/2 sweet and unsweet tea o Supper: little bit of leftover tea; doesn't eat a lot for supper, will fix a salad, some grape tomatoes, 1/2 of a chicken salad sandwich on plain white bread   . Current blood glucose readings: Mostly 120-140s, no higher than 170; . Cardiovascular risk reduction: o Current hypertensive regimen: lisinopril 5 mg QAM, metoprolol succinate 12.5 mg daily; BP well controlled at office appointments <140/90 o Current hyperlipidemia regimen: atorvastatin 20 mg daily, LDL at goal <70  Pharmacist Clinical Goal(s):  [name] Over the next 90 days, patient with work with PharmD and primary care provider to address optimized diabetes management  Interventions: . Will collaborate with [name] to send a prescription for Trulicity 1.5 mg pens to pharmacy.  . Congratulated patient on continued improvement in blood sugars with reduced frequency of hypoglycemic symptoms. Praised for her focus on reduced carbohydrate portion sizes, switch from sweet to 1/2 sweet 1/2 unsweet tea, and commitment to finding exercises that she can tolerate.   Patient Self Care Activities:  . Patient will check blood glucose BID, document, and provide at future appointments . Patient will report any questions or concerns to provider   Please see past updates related to this goal by clicking on the "Past Updates" button in the selected goal         Plan: -  Will outreach patient in ~4-5 weeks prior to her appointment with primary care provider  Catie Darnelle Maffucci, PharmD Clinical Pharmacist [place] [phone]

## 2019-03-02 NOTE — Patient Instructions (Signed)
Visit Information  Goals Addressed            This Visit's Progress     Patient Stated   . "I want to get my blood sugars under control" (pt-stated)       Current Barriers:  . Diabetes: uncontrolled; most recent A1c 7.2%  o Notes that her glucometer needs new batteries, so she could not review readings with me today o Increased Trulicity (using 2 9.62 mg syringes) after our last phone call to see how her sugars responded, and decreased Tresiba from 30 to 25 units daily. Notes improvement in blood sugars with less episodes of symptomatic hypoglycemia. No concerns with recurrent GI upset or a negative impact on appetite o Endorses decline in her memory, sometimes forgets to check sugars, forgets words. She notes that she does word searches; dislikes reading. Plans to discuss with Jolene at upcoming appointment in September.  . Current antihyperglycemic regimen: Trulicity 1.5 mg (2 injection of 0.75 mg), Tresiba 25 units QAM, metformin 1000 mg BID . Reports reduction episodes of hypoglycemic symptoms.  . Current exercise: Has been doing some squats/stretches for exercise, pain limits walking  . Current meal patterns: o Breakfast: skips o Lunch around 11 am: cracker barrel vegetable platter (fried okra, green beans; 1 cornbread muffin - cut back from 2 muffins); occasionally grilled chicken; has decreased from full sweet tea to almost 1/2 and 1/2 sweet and unsweet tea o Supper: little bit of leftover tea; doesn't eat a lot for supper, will fix a salad, some grape tomatoes, 1/2 of a chicken salad sandwich on plain white bread   . Current blood glucose readings: Mostly 120-140s, no higher than 170; . Cardiovascular risk reduction: o Current hypertensive regimen: lisinopril 5 mg QAM, metoprolol succinate 12.5 mg daily; BP well controlled at office appointments <140/90 o Current hyperlipidemia regimen: atorvastatin 20 mg daily, LDL at goal <70  Pharmacist Clinical Goal(s):  [name] Over the next 90  days, patient with work with PharmD and primary care provider to address optimized diabetes management  Interventions: . Will collaborate with [name] to send a prescription for Trulicity 1.5 mg pens to pharmacy.  . Congratulated patient on continued improvement in blood sugars with reduced frequency of hypoglycemic symptoms. Praised for her focus on reduced carbohydrate portion sizes, switch from sweet to 1/2 sweet 1/2 unsweet tea, and commitment to finding exercises that she can tolerate.   Patient Self Care Activities:  . Patient will check blood glucose BID, document, and provide at future appointments . Patient will report any questions or concerns to provider   Please see past updates related to this goal by clicking on the "Past Updates" button in the selected goal         The patient verbalized understanding of instructions provided today and declined a print copy of patient instruction materials.   Plan: - Will outreach patient in ~4-5 weeks prior to her appointment with primary care provider  Catie Darnelle Maffucci, PharmD Clinical Pharmacist [place] [phone]

## 2019-03-02 NOTE — Progress Notes (Signed)
Refer to recent CCM pharmacist note.

## 2019-03-04 ENCOUNTER — Other Ambulatory Visit: Payer: Self-pay | Admitting: Unknown Physician Specialty

## 2019-03-09 ENCOUNTER — Other Ambulatory Visit: Payer: Self-pay | Admitting: Nurse Practitioner

## 2019-03-09 NOTE — Telephone Encounter (Signed)
Requested Prescriptions  Pending Prescriptions Disp Refills  . ACCU-CHEK AVIVA PLUS test strip [Pharmacy Med Name: ACCU-CHEK AVIVA PLUS   Strip] 300 strip 0    Sig: TEST THREE TIMES DAILY     There is no refill protocol information for this order    Patient scheduled for follow up appointment 04/19/2019.

## 2019-03-10 ENCOUNTER — Other Ambulatory Visit: Payer: Self-pay

## 2019-03-10 ENCOUNTER — Ambulatory Visit
Admission: RE | Admit: 2019-03-10 | Discharge: 2019-03-10 | Disposition: A | Discharge status: Home / Self Care | Payer: Medicare HMO | Source: Ambulatory Visit | Attending: Nurse Practitioner | Admitting: Nurse Practitioner

## 2019-03-10 DIAGNOSIS — Z1231 Encounter for screening mammogram for malignant neoplasm of breast: Secondary | ICD-10-CM

## 2019-03-21 ENCOUNTER — Other Ambulatory Visit: Payer: Self-pay

## 2019-03-21 ENCOUNTER — Ambulatory Visit: Payer: Medicare HMO

## 2019-04-04 ENCOUNTER — Other Ambulatory Visit: Payer: Self-pay | Admitting: Nurse Practitioner

## 2019-04-06 ENCOUNTER — Ambulatory Visit (INDEPENDENT_AMBULATORY_CARE_PROVIDER_SITE_OTHER): Payer: Medicare HMO | Admitting: Pharmacist

## 2019-04-06 DIAGNOSIS — E1169 Type 2 diabetes mellitus with other specified complication: Secondary | ICD-10-CM | POA: Diagnosis not present

## 2019-04-06 DIAGNOSIS — E785 Hyperlipidemia, unspecified: Secondary | ICD-10-CM | POA: Diagnosis not present

## 2019-04-06 DIAGNOSIS — Z794 Long term (current) use of insulin: Secondary | ICD-10-CM | POA: Diagnosis not present

## 2019-04-06 DIAGNOSIS — E1140 Type 2 diabetes mellitus with diabetic neuropathy, unspecified: Secondary | ICD-10-CM

## 2019-04-06 NOTE — Chronic Care Management (AMB) (Signed)
Chronic Care Management   Follow Up Note   04/06/2019 Name: Jordan Chan MRN: [id number] DOB: 01/30/1951  Referred by: [name], NP Reason for referral : Chronic Care Management (Medication Management)   ANALEAH Chan is a 68 y.o. year old female who is a primary care patient of [name], [name], NP. The CCM team was consulted for assistance with chronic disease management and care coordination needs.  Contacted patient for medication management review   Review of patient status, including review of consultants reports, relevant laboratory and other test results, and collaboration with appropriate care team members and the patient's provider was performed as part of comprehensive patient evaluation and provision of chronic care management services.    SDOH (Social Determinants of Health) screening performed today: Financial Strain  Physical Activity. See Care Plan for related entries.   Outpatient Encounter Medications as of 04/06/2019  Medication Sig Note   ACCU-CHEK AVIVA PLUS test strip TEST THREE TIMES DAILY    Accu-Chek Softclix Lancets lancets TEST BLOOD SUGAR ONE TIME DAILY    albuterol (PROVENTIL HFA;VENTOLIN HFA) 108 (90 BASE) MCG/ACT inhaler Inhale 2 puffs into the lungs every 6 (six) hours as needed for wheezing or shortness of breath.    aspirin EC 81 MG tablet Take 81 mg by mouth daily.    atorvastatin (LIPITOR) 20 MG tablet TAKE 1 TABLET EVERY DAY    cyclobenzaprine (FLEXERIL) 5 MG tablet Take 5 mg by mouth 2 (two) times daily as needed. 10/20/2018: Taking 2-3 times/week for neck/back   diclofenac (VOLTAREN) 50 MG EC tablet TAKE 1 TABLET (50 MG TOTAL) BY MOUTH 3 (THREE) TIMES DAILY.    Fluticasone-Salmeterol (ADVAIR DISKUS) 250-50 MCG/DOSE AEPB Inhale 1 puff into the lungs 2 (two) times daily.    gabapentin (NEURONTIN) 800 MG tablet Take 1 tablet (800 mg total) by mouth 3 (three) times daily.    insulin degludec (TRESIBA FLEXTOUCH) 100 UNIT/ML  SOPN FlexTouch Pen Inject 0.3 mLs (30 Units total) into the skin at bedtime. 03/02/2019: 25 units   lisinopril (ZESTRIL) 5 MG tablet Take 1 tablet (5 mg total) by mouth daily.    metFORMIN (GLUCOPHAGE) 1000 MG tablet TAKE 1 TABLET TWICE DAILY WITH MEALS    metoprolol succinate (TOPROL-XL) 25 MG 24 hr tablet TAKE 1/2 TABLET EVERY DAY    tiotropium (SPIRIVA HANDIHALER) 18 MCG inhalation capsule Place 18 mcg into inhaler and inhale daily.    TRULICITY 1.5 0000000 SOPN INJECT 1.5MG  (1 PEN) SUBCUTANEOUSLY EVERY WEEK    venlafaxine XR (EFFEXOR-XR) 150 MG 24 hr capsule TAKE 1 CAPSULE EVERY DAY    pantoprazole (PROTONIX) 40 MG tablet Take 1 tablet (40 mg total) by mouth 2 (two) times daily.    vitamin B-12 (CYANOCOBALAMIN) 1000 MCG tablet Take 1,000 mcg by mouth daily.    Facility-Administered Encounter Medications as of 04/06/2019  Medication   cyanocobalamin ((VITAMIN B-12)) injection 1,000 mcg     Goals Addressed            This Visit's Progress     Patient Stated    "I want to get my blood sugars under control" (pt-stated)       Current Barriers:   Diabetes: uncontrolled; most recent A1c 7.2%  o Cataract surgery upcoming in October o Still concerned about memory, forgetting words. Will follow up with Jolene in September  Current antihyperglycemic regimen: Trulicity 1.5 mg, Tresiba 25 units QAM, metformin 1000 mg BID o Notes she skips [place] if her sugars are <130 when  she is going to bed   Current exercise: Has been doing some squats/stretches for exercise, pain limits walking   Current meal patterns: o Breakfast: skips o Lunch around 11 am: cracker barrel vegetable platter (fried okra, green beans; 1 cornbread muffin - cut back from 2 muffins); occasionally grilled chicken; has decreased from full sweet tea to almost 1/2 and 1/2 sweet and unsweet tea o Supper: little bit of leftover tea; doesn't eat a lot for supper, will fix a salad, some grape tomatoes, 1/2 of a chicken  salad sandwich on plain white bread    Current blood glucose readings: Mostly 98-120s; bedtime readings generally less than 180  Cardiovascular risk reduction: o Current hypertensive regimen: lisinopril 5 mg QAM, metoprolol succinate 12.5 mg daily; BP well controlled at office appointments <140/90 o Current hyperlipidemia regimen: atorvastatin 20 mg daily, LDL at goal <70; confirms that she's taking this medication, and hasn't needed to fill at the pharmacy due to having "a stockpile". Notes she will likely need a refill soon. As of 8/1, patient failing adherence measure.   Pharmacist Clinical Goal(s):   Over the next 90 days, patient with work with PharmD and primary care provider to address optimized diabetes management  Interventions:  Congratulated patient on continued improvement in blood sugars.   Reviewed pharmacokinetics of Tyler Aas and how her sugars will not immediately drop. Instead, recommended taking a lower dose of [place] instead of skipping. She notes she will take 20-24 units moving forward and see how this does. We reviewed goal fasting 90-130 (she reports symptomatic hypoglycemia if sugars <100).   Will collaborate with [name] to send atorvastatin refill to [facility] - lipids checked 3 months ago at LDL at goal <70 given T2DM and ASCVD  Patient Self Care Activities:   Patient will check blood glucose BID, document, and provide at future appointments  Patient will report any questions or concerns to provider   Please see past updates related to this goal by clicking on the "Past Updates" button in the selected goal          Plan:  - Will outreach patient in ~4-5 weeks for continued medication management review. PCP appointment in ~2 weeks  Catie Darnelle Maffucci, PharmD Clinical Pharmacist [place] [phone]

## 2019-04-06 NOTE — Patient Instructions (Signed)
Visit Information  Goals Addressed            This Visit's Progress     Patient Stated   . "I want to get my blood sugars under control" (pt-stated)       Current Barriers:  . Diabetes: uncontrolled; most recent A1c 7.2%  o Cataract surgery upcoming in October o Still concerned about memory, forgetting words. Will follow up with Jolene in September . Current antihyperglycemic regimen: Trulicity 1.5 mg, Tresiba 25 units QAM, metformin 1000 mg BID o Notes she skips [place] if her sugars are <130 when she is going to bed  . Current exercise: Has been doing some squats/stretches for exercise, pain limits walking  . Current meal patterns: o Breakfast: skips o Lunch around 11 am: cracker barrel vegetable platter (fried okra, green beans; 1 cornbread muffin - cut back from 2 muffins); occasionally grilled chicken; has decreased from full sweet tea to almost 1/2 and 1/2 sweet and unsweet tea o Supper: little bit of leftover tea; doesn't eat a lot for supper, will fix a salad, some grape tomatoes, 1/2 of a chicken salad sandwich on plain white bread   . Current blood glucose readings: Mostly 98-120s; bedtime readings generally less than 180 . Cardiovascular risk reduction: o Current hypertensive regimen: lisinopril 5 mg QAM, metoprolol succinate 12.5 mg daily; BP well controlled at office appointments <140/90 o Current hyperlipidemia regimen: atorvastatin 20 mg daily, LDL at goal <70; confirms that she's taking this medication, and hasn't needed to fill at the pharmacy due to having "a stockpile". Notes she will likely need a refill soon. As of 8/1, patient failing adherence measure.   Pharmacist Clinical Goal(s):  [name] Over the next 90 days, patient with work with PharmD and primary care provider to address optimized diabetes management  Interventions: . Congratulated patient on continued improvement in blood sugars.  . Reviewed pharmacokinetics of Tyler Aas and how her sugars will not immediately  drop. Instead, recommended taking a lower dose of [place] instead of skipping. She notes she will take 20-24 units moving forward and see how this does. We reviewed goal fasting 90-130 (she reports symptomatic hypoglycemia if sugars <100).  . Will collaborate with [name] to send atorvastatin refill to [facility] - lipids checked 3 months ago at LDL at goal <70 given T2DM and ASCVD  Patient Self Care Activities:  . Patient will check blood glucose BID, document, and provide at future appointments . Patient will report any questions or concerns to provider   Please see past updates related to this goal by clicking on the "Past Updates" button in the selected goal         The patient verbalized understanding of instructions provided today and declined a print copy of patient instruction materials.   Plan:  - Will outreach patient in ~4-5 weeks for continued medication management review. PCP appointment in ~2 weeks  Catie Darnelle Maffucci, PharmD Clinical Pharmacist [place] [phone]

## 2019-04-07 DIAGNOSIS — M5442 Lumbago with sciatica, left side: Secondary | ICD-10-CM | POA: Diagnosis not present

## 2019-04-07 DIAGNOSIS — M792 Neuralgia and neuritis, unspecified: Secondary | ICD-10-CM | POA: Diagnosis not present

## 2019-04-07 DIAGNOSIS — G893 Neoplasm related pain (acute) (chronic): Secondary | ICD-10-CM | POA: Diagnosis not present

## 2019-04-07 DIAGNOSIS — G894 Chronic pain syndrome: Secondary | ICD-10-CM | POA: Diagnosis not present

## 2019-04-19 ENCOUNTER — Other Ambulatory Visit: Payer: Self-pay

## 2019-04-19 ENCOUNTER — Ambulatory Visit (INDEPENDENT_AMBULATORY_CARE_PROVIDER_SITE_OTHER): Payer: Medicare HMO | Admitting: Nurse Practitioner

## 2019-04-19 ENCOUNTER — Encounter: Payer: Self-pay | Admitting: Nurse Practitioner

## 2019-04-19 VITALS — BP 117/74 | HR 81 | Temp 98.5°F

## 2019-04-19 DIAGNOSIS — E785 Hyperlipidemia, unspecified: Secondary | ICD-10-CM

## 2019-04-19 DIAGNOSIS — G8929 Other chronic pain: Secondary | ICD-10-CM

## 2019-04-19 DIAGNOSIS — I10 Essential (primary) hypertension: Secondary | ICD-10-CM | POA: Diagnosis not present

## 2019-04-19 DIAGNOSIS — Z794 Long term (current) use of insulin: Secondary | ICD-10-CM

## 2019-04-19 DIAGNOSIS — E1169 Type 2 diabetes mellitus with other specified complication: Secondary | ICD-10-CM | POA: Diagnosis not present

## 2019-04-19 DIAGNOSIS — Z23 Encounter for immunization: Secondary | ICD-10-CM

## 2019-04-19 DIAGNOSIS — E538 Deficiency of other specified B group vitamins: Secondary | ICD-10-CM

## 2019-04-19 DIAGNOSIS — M545 Low back pain, unspecified: Secondary | ICD-10-CM

## 2019-04-19 DIAGNOSIS — E1140 Type 2 diabetes mellitus with diabetic neuropathy, unspecified: Secondary | ICD-10-CM | POA: Diagnosis not present

## 2019-04-19 LAB — BAYER DCA HB A1C WAIVED: HB A1C (BAYER DCA - WAIVED): 6.7 % (ref <7.0)

## 2019-04-19 NOTE — Assessment & Plan Note (Addendum)
Continue to collaborate with chronic pain clinic in [facility].  Has morphine pump.  Check Vit D level today.

## 2019-04-19 NOTE — Assessment & Plan Note (Signed)
Chronic, stable with BP below goal.  Continue current medication regimen and adjust as needed.   

## 2019-04-19 NOTE — Progress Notes (Signed)
BP 117/74   Pulse 81   Temp 98.5 F (36.9 C) (Oral)   LMP  (LMP Unknown)   SpO2 96%    Subjective:    [name], female    DOB: Sep 21, 1950, 68 y.o.   MRN: [id number]  HPI: Jordan Chan is a 68 y.o. female  Chief Complaint  Patient presents with  . Diabetes  . Hyperlipidemia  . Hypertension   DIABETES On Trulicity recently, taking 1.5 MG weekly.  Continues on Metformin 1000 MG BID and Tresiba 25 units at HS.  Working with CCM team and provider. Recent A1C in June 7.2%. Hypoglycemic episodes:yes, maybe 3-4 over past few months (she takes glucose tablets) Polydipsia/polyuria: no Visual disturbance: no Chest pain: no Paresthesias: no Glucose Monitoring: yes  Accucheck frequency: BID  Fasting glucose: this morning 90, yesterday morning 124, morning average in 120's  Post prandial:  Evening: yesterday evening 224, on average 100-160  Before meals: Taking Insulin?: yes  Long acting insulin: Tresiba 25 units  Short acting insulin: Blood Pressure Monitoring: just got cuff yesterday Retinal Examination: Up to Date Foot Exam: Up to Date Pneumovax: Up to Date Influenza: Up to Date Aspirin: yes   HYPERTENSION / HYPERLIPIDEMIA Continues on Atorvastatin 20 MG, Lisinopril 5 MG, Metoprolol 12.5 MG daily + ASA. Satisfied with current treatment? yes Duration of hypertension: chronic BP monitoring frequency: just got a cuff yesterday BP range: 126/68 BP medication side effects: no Duration of hyperlipidemia: chronic Cholesterol medication side effects: no Cholesterol supplements: none Medication compliance: good compliance Aspirin: yes Recent stressors: no Recurrent headaches: no Visual changes: no Palpitations: no Dyspnea: no Chest pain: no Lower extremity edema: no Dizzy/lightheaded: no   CHRONIC PAIN: Followed by [facility] in [place], last seen 01/26/2019.  Continues to be followed by clinic nurse for pump check and replacement.  She states her pain is well-controlled with current regimen.  Relevant past medical, surgical, family and social history reviewed and updated as indicated. Interim medical history since our last visit reviewed. Allergies and medications reviewed and updated.  Review of Systems  Constitutional: Negative for activity change, appetite change, diaphoresis, fatigue and fever.  Respiratory: Negative for cough, chest tightness and shortness of breath.   Cardiovascular: Negative for chest pain, palpitations and leg swelling.  Gastrointestinal: Negative for abdominal distention, abdominal pain, constipation, diarrhea, nausea and vomiting.  Endocrine: Negative for cold intolerance, heat intolerance, polydipsia, polyphagia and polyuria.  Neurological: Negative for dizziness, syncope, weakness, light-headedness, numbness and headaches.  Psychiatric/Behavioral: Negative.     Per HPI unless specifically indicated above     Objective:    BP 117/74   Pulse 81   Temp 98.5 F (36.9 C) (Oral)   LMP  (LMP Unknown)   SpO2 96%   Wt Readings from Last 3 Encounters:  01/17/19 150 lb (68 kg)  12/20/18 145 lb (65.8 kg)  12/06/18 145 lb (65.8 kg)    Physical Exam Vitals signs and nursing note reviewed.  Constitutional:      General: She is awake. She is not in acute distress.    Appearance: She is well-developed and overweight. She is not ill-appearing.  HENT:     Head: Normocephalic.     Right Ear: Hearing normal.     Left Ear: Hearing normal.  Eyes:     General: Lids are normal.        Right eye: No discharge.        Left eye: No discharge.  Conjunctiva/sclera: Conjunctivae normal.     Pupils: Pupils are equal, round, and reactive to light.  Neck:     Musculoskeletal: Normal range of motion and neck supple.     Thyroid: No thyromegaly.     Vascular: No carotid bruit.  Cardiovascular:     Rate and Rhythm: Normal rate and regular rhythm.     Heart sounds: Normal heart sounds. No murmur.  No gallop.   Pulmonary:     Effort: Pulmonary effort is normal. No accessory muscle usage or respiratory distress.     Breath sounds: Normal breath sounds.  Abdominal:     General: Bowel sounds are normal.     Palpations: Abdomen is soft.  Musculoskeletal:     Right lower leg: No edema.     Left lower leg: No edema.  Lymphadenopathy:     Cervical: No cervical adenopathy.  Skin:    General: Skin is warm and dry.  Neurological:     Mental Status: She is alert and oriented to person, place, and time.  Psychiatric:        Attention and Perception: Attention normal.        Mood and Affect: Mood normal.        Behavior: Behavior normal. Behavior is cooperative.        Thought Content: Thought content normal.        Judgment: Judgment normal.    Diabetic Foot Exam - Simple   Simple Foot Form Visual Inspection No deformities, no ulcerations, no other skin breakdown bilaterally: Yes Sensation Testing See comments: Yes Pulse Check Posterior Tibialis and Dorsalis pulse intact bilaterally: Yes Comments Left foot sensation 6/10 and right foot 6/10     Results for orders placed or performed in visit on 02/16/19  HM DIABETES EYE EXAM  Result Value Ref Range   HM Diabetic Eye Exam No Retinopathy No Retinopathy      Assessment & Plan:   Problem List Items Addressed This Visit      Cardiovascular and Mediastinum   Essential hypertension    Chronic, stable with BP below goal.  Continue current medication regimen and adjust as needed.        Endocrine   Hyperlipidemia associated with type 2 diabetes mellitus (HCC)    Chronic, stable.  Continue current medication regimen and adjust as needed.  Well-controlled on recent labs.         Nervous and Auditory   Type 2 diabetes mellitus with diabetic neuropathy, unspecified ([place]) - Primary    Chronic, ongoing with A1C 6.7% today. Congratulated patient on reduction, at goal <7.  Continue current medication regimen and monitoring BS  closely.  Have recommend cutting back on Tresiba 3 units every 3 days based on morning BS readings.  Return in 3 months.         Relevant Orders   Bayer DCA Hb A1c Waived     Other   Chronic back pain    Continue to collaborate with chronic pain clinic in [facility].  Has morphine pump.  Check Vit D level today.      Relevant Orders   Vitamin D (25 hydroxy)   Magnesium   B12 deficiency    Check B12 level today and continue injections.      Relevant Orders   Vitamin B12    Other Visit Diagnoses    Need for influenza vaccination       Relevant Orders   Flu Vaccine QUAD High Dose(Fluad) (Completed)  Follow up plan: Return in about 3 months (around 07/19/2019) for T2DM, HTN/HLD, Depression, GERD.

## 2019-04-19 NOTE — Assessment & Plan Note (Signed)
Chronic, ongoing with A1C 6.7% today. Congratulated patient on reduction, at goal <7.  Continue current medication regimen and monitoring BS closely.  Have recommend cutting back on Tresiba 3 units every 3 days based on morning BS readings.  Return in 3 months.

## 2019-04-19 NOTE — Assessment & Plan Note (Signed)
Chronic, stable.  Continue current medication regimen and adjust as needed.  Well-controlled on recent labs.

## 2019-04-19 NOTE — Assessment & Plan Note (Signed)
Check B12 level today and continue injections. 

## 2019-04-19 NOTE — Patient Instructions (Signed)
Influenza (Flu) Vaccine (Inactivated or Recombinant): What You Need to Know 1. Why get vaccinated? Influenza vaccine can prevent influenza (flu). Flu is a contagious disease that spreads around the [place] every year, usually between October and May. Anyone can get the flu, but it is more dangerous for some people. Infants and young children, people 69 years of age and older, pregnant women, and people with certain health conditions or a weakened immune system are at greatest risk of flu complications. Pneumonia, bronchitis, sinus infections and ear infections are examples of flu-related complications. If you have a medical condition, such as heart disease, cancer or diabetes, flu can make it worse. Flu can cause fever and chills, sore throat, muscle aches, fatigue, cough, headache, and runny or stuffy nose. Some people may have vomiting and diarrhea, though this is more common in children than adults. Each year thousands of people in the [place] die from flu, and many more are hospitalized. Flu vaccine prevents millions of illnesses and flu-related visits to the doctor each year. 2. Influenza vaccine CDC recommends everyone 6 months of age and older get vaccinated every flu season. Children 6 months through 8 years of age may need 2 doses during a single flu season. Everyone else needs only 1 dose each flu season. It takes about 2 weeks for protection to develop after vaccination. There are many flu viruses, and they are always changing. Each year a new flu vaccine is made to protect against three or four viruses that are likely to cause disease in the upcoming flu season. Even when the vaccine doesn't exactly match these viruses, it may still provide some protection. Influenza vaccine does not cause flu. Influenza vaccine may be given at the same time as other vaccines. 3. Talk with your health care provider Tell your vaccine provider if the person getting the vaccine:  Has had an  allergic reaction after a previous dose of influenza vaccine, or has any severe, life-threatening allergies.  Has ever had Guillain-Barr Syndrome (also called GBS). In some cases, your health care provider may decide to postpone influenza vaccination to a future visit. People with minor illnesses, such as a cold, may be vaccinated. People who are moderately or severely ill should usually wait until they recover before getting influenza vaccine. Your health care provider can give you more information. 4. Risks of a vaccine reaction  Soreness, redness, and swelling where shot is given, fever, muscle aches, and headache can happen after influenza vaccine.  There may be a very small increased risk of Guillain-Barr Syndrome (GBS) after inactivated influenza vaccine (the flu shot). Young children who get the flu shot along with pneumococcal vaccine (PCV13), and/or DTaP vaccine at the same time might be slightly more likely to have a seizure caused by fever. Tell your health care provider if a child who is getting flu vaccine has ever had a seizure. People sometimes faint after medical procedures, including vaccination. Tell your provider if you feel dizzy or have vision changes or ringing in the ears. As with any medicine, there is a very remote chance of a vaccine causing a severe allergic reaction, other serious injury, or death. 5. What if there is a serious problem? An allergic reaction could occur after the vaccinated person leaves the clinic. If you see signs of a severe allergic reaction (hives, swelling of the face and throat, difficulty breathing, a fast heartbeat, dizziness, or weakness), call 9-1-1 and get the person to the nearest hospital. For other signs that   concern you, call your health care provider. Adverse reactions should be reported to the Vaccine Adverse Event Reporting System (VAERS). Your health care provider will usually file this report, or you can do it yourself. Visit the  VAERS website at www.vaers.hhs.gov or call 1-800-822-7967.VAERS is only for reporting reactions, and VAERS staff do not give medical advice. 6. The National Vaccine Injury Compensation Program The National Vaccine Injury Compensation Program (VICP) is a federal program that was created to compensate people who may have been injured by certain vaccines. Visit the VICP website at www.hrsa.gov/vaccinecompensation or call 1-800-338-2382 to learn about the program and about filing a claim. There is a time limit to file a claim for compensation. 7. How can I learn more?  Ask your healthcare provider.  Call your local or state health department.  Contact the Centers for Disease Control and Prevention (CDC): ? Call 1-800-232-4636 (1-800-CDC-INFO) or ? Visit CDC's www.cdc.gov/flu Vaccine Information Statement (Interim) Inactivated Influenza Vaccine (03/18/2018) This information is not intended to replace advice given to you by your health care provider. Make sure you discuss any questions you have with your health care provider. Document Released: 05/15/2006 Document Revised: 11/09/2018 Document Reviewed: 03/22/2018 Elsevier Patient Education  2020 Elsevier Inc.  

## 2019-04-20 LAB — VITAMIN B12: Vitamin B-12: 494 pg/mL (ref 232–1245)

## 2019-04-20 LAB — MAGNESIUM: Magnesium: 1.6 mg/dL (ref 1.6–2.3)

## 2019-04-20 LAB — VITAMIN D 25 HYDROXY (VIT D DEFICIENCY, FRACTURES): Vit D, 25-Hydroxy: 17.5 ng/mL — ABNORMAL LOW (ref 30.0–100.0)

## 2019-04-25 ENCOUNTER — Ambulatory Visit (INDEPENDENT_AMBULATORY_CARE_PROVIDER_SITE_OTHER): Payer: Medicare HMO

## 2019-04-25 ENCOUNTER — Other Ambulatory Visit: Payer: Self-pay

## 2019-04-25 DIAGNOSIS — E538 Deficiency of other specified B group vitamins: Secondary | ICD-10-CM

## 2019-05-05 DIAGNOSIS — E1159 Type 2 diabetes mellitus with other circulatory complications: Secondary | ICD-10-CM | POA: Diagnosis not present

## 2019-05-05 DIAGNOSIS — H25011 Cortical age-related cataract, right eye: Secondary | ICD-10-CM | POA: Diagnosis not present

## 2019-05-09 ENCOUNTER — Encounter: Payer: Self-pay | Admitting: *Deleted

## 2019-05-11 ENCOUNTER — Telehealth: Payer: Self-pay

## 2019-05-11 ENCOUNTER — Ambulatory Visit: Payer: Self-pay | Admitting: Pharmacist

## 2019-05-11 NOTE — Chronic Care Management (AMB) (Signed)
  Chronic Care Management   Note  05/11/2019 Name: ELICA ZOLL MRN: [id number] DOB: [birth year]  [name] is a 68 y.o. year old female who is a primary care patient of [name], [name], NP. The CCM team was consulted for assistance with chronic disease management and care coordination needs.    Attempted to contact patient to f/u on medication management. Left HIPAA compliant message for her to return my call at her convenience.   Follow up plan: - If I do not hear back, will outreach again in the next 6-8 weeks   Catie Darnelle Maffucci, PharmD Clinical Pharmacist [place] [phone]

## 2019-05-12 ENCOUNTER — Other Ambulatory Visit: Payer: Self-pay | Admitting: Unknown Physician Specialty

## 2019-05-13 DIAGNOSIS — H2512 Age-related nuclear cataract, left eye: Secondary | ICD-10-CM | POA: Diagnosis not present

## 2019-05-16 ENCOUNTER — Other Ambulatory Visit: Payer: Self-pay

## 2019-05-16 ENCOUNTER — Other Ambulatory Visit
Admission: RE | Admit: 2019-05-16 | Discharge: 2019-05-16 | Disposition: A | Discharge status: Home / Self Care | Payer: Medicare HMO | Source: Ambulatory Visit | Attending: Ophthalmology | Admitting: Ophthalmology

## 2019-05-16 DIAGNOSIS — Z20828 Contact with and (suspected) exposure to other viral communicable diseases: Secondary | ICD-10-CM | POA: Diagnosis not present

## 2019-05-16 DIAGNOSIS — Z01812 Encounter for preprocedural laboratory examination: Secondary | ICD-10-CM | POA: Insufficient documentation

## 2019-05-16 LAB — SARS CORONAVIRUS 2 (TAT 6-24 HRS): SARS Coronavirus 2: NEGATIVE

## 2019-05-17 ENCOUNTER — Other Ambulatory Visit: Payer: Self-pay | Admitting: Nurse Practitioner

## 2019-05-17 MED ORDER — TRULICITY 1.5 MG/0.5ML ~~LOC~~ SOAJ: SUBCUTANEOUS | 0 refills | Status: DC

## 2019-05-17 MED ORDER — GABAPENTIN 800 MG PO TABS: 800.0000 mg | ORAL_TABLET | Freq: Three times a day (TID) | ORAL | 3 refills | Status: DC

## 2019-05-17 NOTE — Telephone Encounter (Signed)
Pt request refill   gabapentin (NEURONTIN) [id number] MG tablet  TRULICITY 1.5 0000000 SOPN   90 days on both  [place], [place] [phone] (Phone) [phone] (Fax)

## 2019-05-18 ENCOUNTER — Other Ambulatory Visit: Payer: Self-pay | Admitting: Nurse Practitioner

## 2019-05-18 ENCOUNTER — Telehealth: Payer: Self-pay

## 2019-05-18 MED ORDER — TRULICITY 1.5 MG/0.5ML ~~LOC~~ SOAJ: SUBCUTANEOUS | 3 refills | Status: DC

## 2019-05-18 NOTE — Telephone Encounter (Signed)
Pharmacy sent a fax regarding patient's Trulicity RX. It was written for 3 pens but pharmacy states that it comes in boxes of 4 that cannot be split up. Can we resend RX for 4 pens instead of 3 please?

## 2019-05-18 NOTE — Telephone Encounter (Signed)
Completed.

## 2019-05-19 ENCOUNTER — Ambulatory Visit
Admission: RE | Admit: 2019-05-19 | Discharge: 2019-05-19 | Disposition: A | Discharge status: Home / Self Care | Payer: Medicare HMO | Attending: Ophthalmology | Admitting: Ophthalmology

## 2019-05-19 ENCOUNTER — Ambulatory Visit: Payer: Medicare HMO | Admitting: Anesthesiology

## 2019-05-19 ENCOUNTER — Other Ambulatory Visit: Payer: Self-pay | Admitting: Nurse Practitioner

## 2019-05-19 ENCOUNTER — Encounter: Payer: Self-pay | Admitting: *Deleted

## 2019-05-19 ENCOUNTER — Other Ambulatory Visit: Payer: Self-pay

## 2019-05-19 ENCOUNTER — Encounter
Admission: RE | Disposition: A | Discharge status: Home / Self Care | Payer: Self-pay | Source: Home / Self Care | Attending: Ophthalmology

## 2019-05-19 DIAGNOSIS — Z79899 Other long term (current) drug therapy: Secondary | ICD-10-CM | POA: Insufficient documentation

## 2019-05-19 DIAGNOSIS — H25011 Cortical age-related cataract, right eye: Secondary | ICD-10-CM | POA: Diagnosis not present

## 2019-05-19 DIAGNOSIS — J449 Chronic obstructive pulmonary disease, unspecified: Secondary | ICD-10-CM | POA: Diagnosis not present

## 2019-05-19 DIAGNOSIS — E1136 Type 2 diabetes mellitus with diabetic cataract: Secondary | ICD-10-CM | POA: Insufficient documentation

## 2019-05-19 DIAGNOSIS — K219 Gastro-esophageal reflux disease without esophagitis: Secondary | ICD-10-CM | POA: Insufficient documentation

## 2019-05-19 DIAGNOSIS — H269 Unspecified cataract: Secondary | ICD-10-CM | POA: Diagnosis not present

## 2019-05-19 DIAGNOSIS — I252 Old myocardial infarction: Secondary | ICD-10-CM | POA: Diagnosis not present

## 2019-05-19 DIAGNOSIS — Z7982 Long term (current) use of aspirin: Secondary | ICD-10-CM | POA: Diagnosis not present

## 2019-05-19 DIAGNOSIS — I10 Essential (primary) hypertension: Secondary | ICD-10-CM | POA: Diagnosis not present

## 2019-05-19 DIAGNOSIS — E7800 Pure hypercholesterolemia, unspecified: Secondary | ICD-10-CM | POA: Insufficient documentation

## 2019-05-19 DIAGNOSIS — E119 Type 2 diabetes mellitus without complications: Secondary | ICD-10-CM | POA: Diagnosis not present

## 2019-05-19 DIAGNOSIS — H2511 Age-related nuclear cataract, right eye: Secondary | ICD-10-CM | POA: Diagnosis not present

## 2019-05-19 DIAGNOSIS — G4730 Sleep apnea, unspecified: Secondary | ICD-10-CM | POA: Insufficient documentation

## 2019-05-19 DIAGNOSIS — Z791 Long term (current) use of non-steroidal anti-inflammatories (NSAID): Secondary | ICD-10-CM | POA: Diagnosis not present

## 2019-05-19 DIAGNOSIS — Z7951 Long term (current) use of inhaled steroids: Secondary | ICD-10-CM | POA: Diagnosis not present

## 2019-05-19 DIAGNOSIS — Z8673 Personal history of transient ischemic attack (TIA), and cerebral infarction without residual deficits: Secondary | ICD-10-CM | POA: Diagnosis not present

## 2019-05-19 DIAGNOSIS — Z7984 Long term (current) use of oral hypoglycemic drugs: Secondary | ICD-10-CM | POA: Diagnosis not present

## 2019-05-19 DIAGNOSIS — Z9981 Dependence on supplemental oxygen: Secondary | ICD-10-CM | POA: Diagnosis not present

## 2019-05-19 HISTORY — DX: Diverticulitis of intestine, part unspecified, without perforation or abscess without bleeding: K57.92

## 2019-05-19 HISTORY — DX: Pneumonia, unspecified organism: J18.9

## 2019-05-19 HISTORY — DX: Sedative, hypnotic or anxiolytic use, unspecified with withdrawal, unspecified: F13.939

## 2019-05-19 HISTORY — DX: Essential (primary) hypertension: I10

## 2019-05-19 HISTORY — DX: Pure hypercholesterolemia, unspecified: E78.00

## 2019-05-19 HISTORY — DX: Scoliosis, unspecified: M41.9

## 2019-05-19 HISTORY — DX: Personal history of other infectious and parasitic diseases: Z86.19

## 2019-05-19 HISTORY — DX: Sedative, hypnotic or anxiolytic dependence with withdrawal, unspecified: F13.239

## 2019-05-19 HISTORY — DX: Dyspnea, unspecified: R06.00

## 2019-05-19 HISTORY — PX: CATARACT EXTRACTION W/PHACO: SHX586

## 2019-05-19 HISTORY — DX: Dependence on supplemental oxygen: Z99.81

## 2019-05-19 LAB — GLUCOSE, CAPILLARY
Glucose-Capillary: 107 mg/dL — ABNORMAL HIGH (ref 70–99)
Glucose-Capillary: 88 mg/dL (ref 70–99)

## 2019-05-19 SURGERY — PHACOEMULSIFICATION, CATARACT, WITH IOL INSERTION
Anesthesia: Monitor Anesthesia Care | Laterality: Right

## 2019-05-19 MED ORDER — MIDAZOLAM HCL 2 MG/2ML IJ SOLN
INTRAMUSCULAR | Status: DC | PRN
  Administered 2019-05-19 (×4): 0.5 mg via INTRAVENOUS

## 2019-05-19 MED ORDER — TETRACAINE HCL 0.5 % OP SOLN
1.0000 [drp] | Freq: Once | OPHTHALMIC | Status: AC
  Administered 2019-05-19: 07:00:00 1 [drp] via OPHTHALMIC

## 2019-05-19 MED ORDER — MOXIFLOXACIN HCL 0.5 % OP SOLN
OPHTHALMIC | Status: AC
  Filled 2019-05-19: qty 3

## 2019-05-19 MED ORDER — SODIUM CHLORIDE 0.9 % IV SOLN
INTRAVENOUS | Status: DC
  Administered 2019-05-19 (×2): via INTRAVENOUS

## 2019-05-19 MED ORDER — MOXIFLOXACIN HCL 0.5 % OP SOLN: 1.0000 [drp] | Freq: Once | OPHTHALMIC | Status: DC

## 2019-05-19 MED ORDER — EPINEPHRINE PF 1 MG/ML IJ SOLN
INTRAMUSCULAR | Status: AC
  Filled 2019-05-19: qty 1

## 2019-05-19 MED ORDER — POVIDONE-IODINE 5 % OP SOLN
OPHTHALMIC | Status: AC
  Filled 2019-05-19: qty 30

## 2019-05-19 MED ORDER — MIDAZOLAM HCL 2 MG/2ML IJ SOLN
INTRAMUSCULAR | Status: AC
  Filled 2019-05-19: qty 2

## 2019-05-19 MED ORDER — TRYPAN BLUE 0.06 % OP SOLN
OPHTHALMIC | Status: AC
  Filled 2019-05-19: qty 0.5

## 2019-05-19 MED ORDER — ONDANSETRON HCL 4 MG/2ML IJ SOLN
INTRAMUSCULAR | Status: DC | PRN
  Administered 2019-05-19: 4 mg via INTRAVENOUS

## 2019-05-19 MED ORDER — LIDOCAINE HCL (PF) 4 % IJ SOLN
INTRAOCULAR | Status: DC | PRN
  Administered 2019-05-19: 2 mL via OPHTHALMIC

## 2019-05-19 MED ORDER — POVIDONE-IODINE 5 % OP SOLN
OPHTHALMIC | Status: DC | PRN
  Administered 2019-05-19: 1 via OPHTHALMIC

## 2019-05-19 MED ORDER — FENTANYL CITRATE (PF) 100 MCG/2ML IJ SOLN
INTRAMUSCULAR | Status: AC
  Filled 2019-05-19: qty 2

## 2019-05-19 MED ORDER — NA CHONDROIT SULF-NA HYALURON 40-17 MG/ML IO SOLN
INTRAOCULAR | Status: DC | PRN
  Administered 2019-05-19: 1 mL via INTRAOCULAR

## 2019-05-19 MED ORDER — LIDOCAINE HCL (PF) 4 % IJ SOLN
INTRAMUSCULAR | Status: AC
  Filled 2019-05-19: qty 5

## 2019-05-19 MED ORDER — TRYPAN BLUE 0.06 % OP SOLN
OPHTHALMIC | Status: DC | PRN
  Administered 2019-05-19: 0.5 mL via INTRAOCULAR

## 2019-05-19 MED ORDER — NEOMYCIN-POLYMYXIN-DEXAMETH 3.5-10000-0.1 OP OINT
TOPICAL_OINTMENT | OPHTHALMIC | Status: AC
  Filled 2019-05-19: qty 3.5

## 2019-05-19 MED ORDER — NA HYALUR & NA CHOND-NA HYALUR 0.55-0.5 ML IO KIT
PACK | INTRAOCULAR | Status: DC | PRN
  Administered 2019-05-19: 1 via INTRAOCULAR

## 2019-05-19 MED ORDER — PROPOFOL 10 MG/ML IV BOLUS
INTRAVENOUS | Status: AC
  Filled 2019-05-19: qty 20

## 2019-05-19 MED ORDER — ARMC OPHTHALMIC DILATING DROPS
1.0000 "application " | OPHTHALMIC | Status: AC
  Administered 2019-05-19 (×2): 1 via OPHTHALMIC

## 2019-05-19 MED ORDER — MOXIFLOXACIN HCL 0.5 % OP SOLN
OPHTHALMIC | Status: DC | PRN
  Administered 2019-05-19: 0.2 mL via OPHTHALMIC

## 2019-05-19 MED ORDER — DORZOLAMIDE HCL-TIMOLOL MAL 2-0.5 % OP SOLN
OPHTHALMIC | Status: AC
  Filled 2019-05-19: qty 10

## 2019-05-19 MED ORDER — NA HYALUR & NA CHOND-NA HYALUR 0.55-0.5 ML IO KIT
PACK | INTRAOCULAR | Status: AC
  Filled 2019-05-19: qty 1.05

## 2019-05-19 MED ORDER — EPINEPHRINE PF 1 MG/ML IJ SOLN
INTRAOCULAR | Status: DC | PRN
  Administered 2019-05-19: 200 mL via OPHTHALMIC

## 2019-05-19 MED ORDER — ARMC OPHTHALMIC DILATING DROPS
OPHTHALMIC | Status: AC
  Administered 2019-05-19: 1 via OPHTHALMIC
  Filled 2019-05-19: qty 0.5

## 2019-05-19 MED ORDER — NA CHONDROIT SULF-NA HYALURON 40-17 MG/ML IO SOLN
INTRAOCULAR | Status: AC
  Filled 2019-05-19: qty 1

## 2019-05-19 MED ORDER — TETRACAINE HCL 0.5 % OP SOLN
OPHTHALMIC | Status: AC
  Administered 2019-05-19: 1 [drp] via OPHTHALMIC
  Filled 2019-05-19: qty 4

## 2019-05-19 SURGICAL SUPPLY — 17 items
DISSECTOR HYDRO NUCLEUS 50X22 (MISCELLANEOUS) ×8 IMPLANT
DRSG TEGADERM 2-3/8X2-3/4 SM (GAUZE/BANDAGES/DRESSINGS) ×2 IMPLANT
GLOVE BIOGEL M 6.5 STRL (GLOVE) ×2 IMPLANT
GOWN STRL REUS W/ TWL LRG LVL3 (GOWN DISPOSABLE) ×1 IMPLANT
GOWN STRL REUS W/ TWL XL LVL3 (GOWN DISPOSABLE) ×1 IMPLANT
GOWN STRL REUS W/TWL LRG LVL3 (GOWN DISPOSABLE) ×1
GOWN STRL REUS W/TWL XL LVL3 (GOWN DISPOSABLE) ×1
KNIFE 45D UP 2.3 (MISCELLANEOUS) ×2 IMPLANT
LABEL CATARACT MEDS ST (LABEL) ×2 IMPLANT
LENS IOL TECNIS ITEC 21.0 (Intraocular Lens) ×2 IMPLANT
PACK CATARACT (MISCELLANEOUS) ×2 IMPLANT
PACK CATARACT KING (MISCELLANEOUS) ×2 IMPLANT
PACK EYE AFTER SURG (MISCELLANEOUS) ×2 IMPLANT
SOL BSS BAG (MISCELLANEOUS) ×2
SOLUTION BSS BAG (MISCELLANEOUS) ×1 IMPLANT
WATER STERILE IRR 250ML POUR (IV SOLUTION) ×2 IMPLANT
WIPE NON LINTING 3.25X3.25 (MISCELLANEOUS) ×2 IMPLANT

## 2019-05-19 NOTE — Op Note (Signed)
  PREOPERATIVE DIAGNOSIS:  Nuclear sclerotic cataract of the RIGHT eye.   POSTOPERATIVE DIAGNOSIS:  Nuclear sclerotic cataract of the RIGHT eye.   OPERATIVE PROCEDURE: Cataract surgery OD   SURGEON:  Marchia Meiers, MD.   ANESTHESIA:  Anesthesiologist: Gunnar Fusi, MD CRNA: Kelton Pillar, CRNA  1.      Managed anesthesia care. 2.     0.9ml of Shugarcaine was instilled following the paracentesis   COMPLICATIONS:  None.   TECHNIQUE:   Divide and conquer   DESCRIPTION OF PROCEDURE:  The patient was examined and consented in the preoperative holding area where the aforementioned topical anesthesia was applied to the RIGHT eye and then brought back to the Operating Room where the RIGHT eye was prepped and draped in the usual sterile ophthalmic fashion and a lid speculum was placed. A paracentesis was created with the side port blade, the anterior chamber was washed out with trypan blue to stain the anterior capsule, and the anterior chamber was filled with viscoelastic. A near clear corneal incision was performed with the steel keratome. A continuous curvilinear capsulorrhexis was performed with a cystotome followed by the capsulorrhexis forceps. Hydrodissection and hydrodelineation were carried out with BSS on a blunt cannula. The lens was removed in a divide and conquer  technique and the remaining cortical material was removed with the irrigation-aspiration handpiece. The capsular bag was inflated with viscoelastic and the lens was placed in the capsular bag without complication. The remaining viscoelastic was removed from the eye with the irrigation-aspiration handpiece. The wounds were hydrated. The anterior chamber was flushed and the eye was inflated to physiologic pressure. 0.72ml Vigamox was placed in the anterior chamber. The wounds were found to be water tight. The eye was dressed with Vigamox. The patient was given protective glasses to wear throughout the day and a shield  with which to sleep tonight. The patient was also given drops with which to begin a drop regimen today and will follow-up with me in one day. Implant Name Type Inv. Item Serial No. Manufacturer Lot No. LRB No. Used Action  LENS IOL DIOP 21.0 - [id number] Intraocular Lens LENS IOL DIOP 21.0 [id number]  Right 1 Implanted    Procedure(s) with comments: CATARACT EXTRACTION PHACO AND INTRAOCULAR LENS PLACEMENT ([place]), RIGHT, DIABETIC (Right) - Lot # [id number] H [place]: 00:43.8 CDE: 4.59  Electronically signed: Marchia Meiers 05/19/2019 12:04 PM

## 2019-05-19 NOTE — Anesthesia Post-op Follow-up Note (Signed)
Anesthesia QCDR form completed.        

## 2019-05-19 NOTE — Discharge Instructions (Addendum)
Eye Surgery Discharge Instructions  Expect mild scratchy sensation or mild soreness. DO NOT RUB YOUR EYE!  The day of surgery:  Minimal physical activity, but bed rest is not required  No reading, computer work, or close hand work  No bending, lifting, or straining.  May watch TV  For 24 hours:  No driving, legal decisions, or alcoholic beverages  Safety precautions  Eat anything you prefer: It is better to start with liquids, then soup then solid foods.  Solar shield eyeglasses should be worn for comfort in the sunlight/patch while sleeping  Resume all regular medications including aspirin or Coumadin if these were discontinued prior to surgery. You may shower, bathe, shave, or wash your hair. Tylenol may be taken for mild discomfort. Follow Dr. Sharene Butters eye drop instruction sheet as reviewed.  Call your doctor if you experience significant pain, nausea, or vomiting, fever > 101 or other signs of infection. [phone] or [phone] Specific instructions:  Follow-up Information    Marchia Meiers, MD Follow up.   Specialty: Ophthalmology Why: 05/20/19 @  Contact information: [address] [place] [address] [phone]

## 2019-05-19 NOTE — Anesthesia Postprocedure Evaluation (Signed)
Anesthesia Post Note  Patient: Jordan Chan  Procedure(s) Performed: CATARACT EXTRACTION PHACO AND INTRAOCULAR LENS PLACEMENT ([facility]), RIGHT, DIABETIC (Right )  Patient location during evaluation: PACU Anesthesia Type: MAC Level of consciousness: awake and alert Pain management: pain level controlled Vital Signs Assessment: post-procedure vital signs reviewed and stable Respiratory status: spontaneous breathing, nonlabored ventilation, respiratory function stable and patient connected to nasal cannula oxygen Cardiovascular status: stable and blood pressure returned to baseline Postop Assessment: no apparent nausea or vomiting Anesthetic complications: no     Last Vitals:  Vitals:   05/19/19 0616 05/19/19 0812  BP: (!) 122/55 (!) 124/57  Pulse: 93 86  Resp: 16 18  Temp: 37.1 C (!) 36.1 C  SpO2: 98% 100%    Last Pain:  Vitals:   05/19/19 0812  TempSrc: Temporal  PainSc: 0-No pain                 Abrahan Fulmore Fletcher-Harrison

## 2019-05-19 NOTE — H&P (Signed)
   I have reviewed the patient's H&P and agree with its findings. There have been no interval changes.  Aveyah Greenwood MD Ophthalmology 

## 2019-05-19 NOTE — Anesthesia Preprocedure Evaluation (Signed)
Anesthesia Evaluation  Patient identified by MRN, date of birth, ID band Patient awake    Reviewed: Allergy & Precautions, NPO status , Patient's Chart, lab work & pertinent test results  History of Anesthesia Complications Negative for: history of anesthetic complications  Airway Mallampati: III       Dental  (+) Upper Dentures, Lower Dentures   Pulmonary asthma , sleep apnea (not able to use CPAP) and Oxygen sleep apnea , COPD,  COPD inhaler and oxygen dependent, former smoker,           Cardiovascular hypertension, Pt. on medications + Past MI (with morphine withdrawal on pump failure)  (-) CHF (-) dysrhythmias (-) Valvular Problems/Murmurs     Neuro/Psych neg Seizures Depression TIA (numerous 2 years ago. R facial numbness, R arm, hand weakness, resolved)   GI/Hepatic Neg liver ROS, GERD  Medicated and Controlled,  Endo/Other  diabetes, Type 2, Oral Hypoglycemic Agents  Renal/GU negative Renal ROS     Musculoskeletal   Abdominal   Peds  Hematology   Anesthesia Other Findings   Reproductive/Obstetrics                             Anesthesia Physical Anesthesia Plan  ASA: III  Anesthesia Plan: MAC   Post-op Pain Management:    Induction: Intravenous  PONV Risk Score and Plan: 2  Airway Management Planned: Nasal Cannula  Additional Equipment:   Intra-op Plan:   Post-operative Plan:   Informed Consent: I have reviewed the patients History and Physical, chart, labs and discussed the procedure including the risks, benefits and alternatives for the proposed anesthesia with the patient or authorized representative who has indicated his/her understanding and acceptance.       Plan Discussed with:   Anesthesia Plan Comments:         Anesthesia Quick Evaluation

## 2019-05-19 NOTE — Transfer of Care (Signed)
Immediate Anesthesia Transfer of Care Note  Patient: Jordan Chan  Procedure(s) Performed: CATARACT EXTRACTION PHACO AND INTRAOCULAR LENS PLACEMENT ([facility]), RIGHT, DIABETIC (Right )  Patient Location: PACU  Anesthesia Type:MAC  Level of Consciousness: awake, alert , oriented and patient cooperative  Airway & Oxygen Therapy: Patient Spontanous Breathing  Post-op Assessment: Report given to RN and Post -op Vital signs reviewed and stable  Post vital signs: Reviewed and stable  Last Vitals:  Vitals Value Taken Time  BP    Temp    Pulse    Resp    SpO2      Last Pain:  Vitals:   05/19/19 0616  TempSrc: Oral  PainSc: 6          Complications: No apparent anesthesia complications

## 2019-05-20 ENCOUNTER — Other Ambulatory Visit: Payer: Self-pay | Admitting: Nurse Practitioner

## 2019-05-20 ENCOUNTER — Telehealth: Payer: Self-pay | Admitting: Nurse Practitioner

## 2019-05-20 MED ORDER — TRULICITY 1.5 MG/0.5ML ~~LOC~~ SOAJ: SUBCUTANEOUS | 3 refills | Status: DC

## 2019-05-20 NOTE — Telephone Encounter (Signed)
Patient called in she got a call from [facility] mail order delivery with a few questions about patients current medication Dulaglutide (TRULICITY) 1.5 0000000 SOPN    Please advise  Fax number [phone]

## 2019-05-20 NOTE — Telephone Encounter (Signed)
Spoke with patient already.  Copied from [place] [phone]. Topic: General - Other >> May 20, 2019  4:48 PM Rainey Pines A wrote: Patient stated that she was returning a call for what she thinks is in regards to her trulicity medication from the nurse.

## 2019-05-20 NOTE — Telephone Encounter (Signed)
Please send 12 pens, which will be a 3 month supply to [facility]

## 2019-05-20 NOTE — Telephone Encounter (Addendum)
[name] or [name], did one of you call her?  I did not.  Ignore, she wanted 12 pens sent in, which I have now done.

## 2019-05-24 DIAGNOSIS — R911 Solitary pulmonary nodule: Secondary | ICD-10-CM | POA: Diagnosis not present

## 2019-05-24 DIAGNOSIS — H0011 Chalazion right upper eyelid: Secondary | ICD-10-CM | POA: Diagnosis not present

## 2019-05-24 DIAGNOSIS — J439 Emphysema, unspecified: Secondary | ICD-10-CM | POA: Diagnosis not present

## 2019-05-24 DIAGNOSIS — J449 Chronic obstructive pulmonary disease, unspecified: Secondary | ICD-10-CM | POA: Diagnosis not present

## 2019-05-30 ENCOUNTER — Ambulatory Visit (INDEPENDENT_AMBULATORY_CARE_PROVIDER_SITE_OTHER): Payer: Medicare HMO

## 2019-05-30 ENCOUNTER — Other Ambulatory Visit: Payer: Self-pay

## 2019-05-30 DIAGNOSIS — E538 Deficiency of other specified B group vitamins: Secondary | ICD-10-CM | POA: Diagnosis not present

## 2019-06-01 ENCOUNTER — Ambulatory Visit (INDEPENDENT_AMBULATORY_CARE_PROVIDER_SITE_OTHER): Payer: Medicare HMO | Admitting: Pharmacist

## 2019-06-01 ENCOUNTER — Other Ambulatory Visit: Payer: Self-pay | Admitting: Nurse Practitioner

## 2019-06-01 DIAGNOSIS — E1140 Type 2 diabetes mellitus with diabetic neuropathy, unspecified: Secondary | ICD-10-CM

## 2019-06-01 DIAGNOSIS — E1169 Type 2 diabetes mellitus with other specified complication: Secondary | ICD-10-CM | POA: Diagnosis not present

## 2019-06-01 DIAGNOSIS — E785 Hyperlipidemia, unspecified: Secondary | ICD-10-CM | POA: Diagnosis not present

## 2019-06-01 DIAGNOSIS — Z794 Long term (current) use of insulin: Secondary | ICD-10-CM | POA: Diagnosis not present

## 2019-06-01 MED ORDER — ATORVASTATIN CALCIUM 20 MG PO TABS: 20.0000 mg | ORAL_TABLET | Freq: Every day | ORAL | 3 refills | Status: DC

## 2019-06-01 NOTE — Patient Instructions (Signed)
Visit Information  Goals Addressed            This Visit's Progress     Patient Stated   . "I want to get my blood sugars under control" (pt-stated)       Current Barriers:  . Diabetes: controled, most recent A1c 6.7%  o S/p cataract surgery, notes she had a swollen duct in her eyelid when became swollen and inflammed. Somewhat concerned about upcoming second procedure . Current antihyperglycemic regimen: Trulicity 1.5 mg, Tresiba 24 units QAM, metformin 1000 mg BID . Current exercise: Staying active w/ squats and stretches; lots of time spent with her 4 grandchildren lately, notes that she is feeling better than she has in a while . Current meal patterns: o Continues to avoid sweet tea as much as she can, does not drink any after lunch . Current blood glucose readings: Fasting at goal <130, most post-prandial <180 . Cardiovascular risk reduction: o Current hypertensive regimen: lisinopril 5 mg QAM, metoprolol succinate 12.5 mg daily; BP well controlled at office appointments <140/90 o Current hyperlipidemia regimen: atorvastatin 20 mg daily, LDL at goal <70; last fill date 10/2018 for a 90 day supply. Patient notes that her sister was taken off of atorvastatin, so she has been taking that supply to avoid wasting the medication. Notes she will need a refill soon.   Pharmacist Clinical Goal(s):  [name] Over the next 90 days, patient with work with PharmD and primary care provider to address optimized diabetes management  Interventions: . Congratulated patient on achieving goal A1c. She is extremely grateful for the support she has received . Encouraged continued focus on carbohydrate reduction and remaining active.  . Will collaborate w/ [name] for a refill of atorvastatin. May still be able to have patient avoid failing statin adherence measure if filled soon.  Patient Self Care Activities:  . Patient will check blood glucose BID, document, and provide at future  appointments . Patient will report any questions or concerns to provider   Please see past updates related to this goal by clicking on the "Past Updates" button in the selected goal         The patient verbalized understanding of instructions provided today and declined a print copy of patient instruction materials.   Plan:  - Will meet with patient face to face at follow up appointment with [name], NP in December  Courtney Heys, PharmD Clinical Pharmacist [place] [phone]

## 2019-06-01 NOTE — Chronic Care Management (AMB) (Signed)
Chronic Care Management   Follow Up Note   06/01/2019 Name: Jordan Chan MRN: [id number] DOB: [birth year]  Referred by: [name], NP Reason for referral : Chronic Care Management (Medication Management)   Jordan Chan is a 68 y.o. year old female who is a primary care patient of [name], [name], NP. The CCM team was consulted for assistance with chronic disease management and care coordination needs.    Contacted patient telephonically for medication management follow up.   Review of patient status, including review of consultants reports, relevant laboratory and other test results, and collaboration with appropriate care team members and the patient's provider was performed as part of comprehensive patient evaluation and provision of chronic care management services.    SDOH (Social Determinants of Health) screening performed today: Stress Physical Activity. See Care Plan for related entries.   Advanced Directives Status: N See Care Plan and Vynca application for related entries.  Outpatient Encounter Medications as of 06/01/2019  Medication Sig Note  . Dulaglutide (TRULICITY) 1.5 0000000 SOPN INJECT 1.5MG  (1 PEN) SUBCUTANEOUSLY EVERY WEEK   . insulin degludec (TRESIBA FLEXTOUCH) 100 UNIT/ML SOPN FlexTouch Pen Inject 0.3 mLs (30 Units total) into the skin at bedtime. 03/02/2019: 25 units  . ACCU-CHEK AVIVA PLUS test strip TEST THREE TIMES DAILY   . Accu-Chek Softclix Lancets lancets TEST BLOOD SUGAR ONE TIME DAILY   . albuterol (PROVENTIL HFA;VENTOLIN HFA) 108 (90 BASE) MCG/ACT inhaler Inhale 2 puffs into the lungs every 6 (six) hours as needed for wheezing or shortness of breath.   [name] aspirin EC 81 MG tablet Take 81 mg by mouth daily.   [name] atorvastatin (LIPITOR) 20 MG tablet TAKE 1 TABLET EVERY DAY (Patient not taking: Reported on 06/01/2019)   . cyclobenzaprine (FLEXERIL) 5 MG tablet Take 5 mg by mouth 2 (two) times daily as needed. 10/20/2018: Taking 2-3  times/week for neck/back  . diclofenac (VOLTAREN) 50 MG EC tablet TAKE 1 TABLET (50 MG TOTAL) BY MOUTH 3 (THREE) TIMES DAILY.   [name] Fluticasone-Salmeterol (ADVAIR DISKUS) 250-50 MCG/DOSE AEPB Inhale 1 puff into the lungs 2 (two) times daily.   [name] gabapentin (NEURONTIN) 800 MG tablet Take 1 tablet (800 mg total) by mouth 3 (three) times daily.   [name] lisinopril (ZESTRIL) 5 MG tablet Take 1 tablet (5 mg total) by mouth daily.   . metFORMIN (GLUCOPHAGE) 1000 MG tablet TAKE 1 TABLET TWICE DAILY WITH MEALS   . metoprolol succinate (TOPROL-XL) 25 MG 24 hr tablet TAKE 1/2 TABLET EVERY DAY   . pantoprazole (PROTONIX) 40 MG tablet Take 1 tablet (40 mg total) by mouth 2 (two) times daily.   [name] tiotropium (SPIRIVA HANDIHALER) 18 MCG inhalation capsule Place 18 mcg into inhaler and inhale daily.   [name] venlafaxine XR (EFFEXOR-XR) 150 MG 24 hr capsule TAKE 1 CAPSULE EVERY DAY   . vitamin B-12 (CYANOCOBALAMIN) 1000 MCG tablet Take 1,000 mcg by mouth daily.    Facility-Administered Encounter Medications as of 06/01/2019  Medication  . cyanocobalamin ((VITAMIN B-12)) injection 1,000 mcg     Goals Addressed            This Visit's Progress     Patient Stated   . "I want to get my blood sugars under control" (pt-stated)       Current Barriers:  . Diabetes: controled, most recent A1c 6.7%  o S/p cataract surgery, notes she had a swollen duct in her eyelid when became swollen and inflammed. Somewhat concerned about upcoming second  procedure . Current antihyperglycemic regimen: Trulicity 1.5 mg, Tresiba 24 units QAM, metformin 1000 mg BID . Current exercise: Staying active w/ squats and stretches; lots of time spent with her 4 grandchildren lately, notes that she is feeling better than she has in a while . Current meal patterns: o Continues to avoid sweet tea as much as she can, does not drink any after lunch . Current blood glucose readings: Fasting at goal <130, most post-prandial <180 . Cardiovascular risk  reduction: o Current hypertensive regimen: lisinopril 5 mg QAM, metoprolol succinate 12.5 mg daily; BP well controlled at office appointments <140/90 o Current hyperlipidemia regimen: atorvastatin 20 mg daily, LDL at goal <70; last fill date 10/2018 for a 90 day supply. Patient notes that her sister was taken off of atorvastatin, so she has been taking that supply to avoid wasting the medication. Notes she will need a refill soon.   Pharmacist Clinical Goal(s):  [name] Over the next 90 days, patient with work with PharmD and primary care provider to address optimized diabetes management  Interventions: . Congratulated patient on achieving goal A1c. She is extremely grateful for the support she has received . Encouraged continued focus on carbohydrate reduction and remaining active.  . Will collaborate w/ [name] for a refill of atorvastatin. May still be able to have patient avoid failing statin adherence measure if filled soon.  Patient Self Care Activities:  . Patient will check blood glucose BID, document, and provide at future appointments . Patient will report any questions or concerns to provider   Please see past updates related to this goal by clicking on the "Past Updates" button in the selected goal         Plan:  - Will meet with patient face to face at follow up appointment with [name], NP in December  Courtney Heys, PharmD Clinical Pharmacist [place] [phone]

## 2019-06-02 ENCOUNTER — Other Ambulatory Visit: Payer: Self-pay | Admitting: Nurse Practitioner

## 2019-06-03 DIAGNOSIS — H2512 Age-related nuclear cataract, left eye: Secondary | ICD-10-CM | POA: Diagnosis not present

## 2019-06-07 ENCOUNTER — Ambulatory Visit: Payer: Self-pay | Admitting: *Deleted

## 2019-06-07 DIAGNOSIS — S20219A Contusion of unspecified front wall of thorax, initial encounter: Secondary | ICD-10-CM | POA: Diagnosis not present

## 2019-06-07 NOTE — Telephone Encounter (Signed)
Pt called in c/o severe pain in right side over her ribs after twisting her right ankle this morning and falling into the door knob hitting the rib area.  "It hurts so bad and I can't hardly take a deep breath".   Pt was in tears as I was talking to her.    She did not want to go to the ED.   She asked if she could go to the urgent care.   I let her know that would be fine.   She's going to call her husband to come back home and take her to the urgent care now.     I instructed her to call 911 if the pain became worse or she had difficulty breathing or felt like she may pass out.    I encouraged her to take some Tylenol to help with the pain.    She verbalized understanding and agreement to the care advice.    [facility] is closed today.   No one in the office.    Only virtual visits available.   (Office closed during this election day due to prior marches and protests in [place], [place] for staff safety).      Reason for Disposition . SEVERE chest pain  Answer Assessment - Initial Assessment Questions 1. MECHANISM: "How did the injury happen?"     I twisted my ankle and fell against the door knob on my front door.   Happened about 6:30 AM this morning.     I'm having a hard time getting my breath in.   It's on right side over ribs.   I had a mastectomy on right side in 1998.   I had nervef damage from the surgery. 2. ONSET: "When did the injury happen?" (Minutes or hours ago)     This morning.   3. LOCATION: "Where on the chest is the injury located?"     Right ribs.   Twisted right ankle.    I didn't fall all the way down. 4. APPEARANCE: "What does the injury look like?"     I don't see anything.    5. BLEEDING: "Is there any bleeding now? If so, ask: How long has it been bleeding?"     I don't see anything over my ribs except maybe a little swollen. 6. SEVERITY: "Any difficulty with breathing?"     Yes.   It hurts real bad to take a deep breath 7. SIZE: For cuts, bruises, or  swelling, ask: "How large is it?" (e.g., inches or centimeters)     Just swelling 8. PAIN: "Is there pain?" If so, ask: "How bad is the pain?"   (e.g., Scale 1-10; or mild, moderate, severe)     Severe   I can't straighten up 9. TETANUS: For any breaks in the skin, ask: "When was the last tetanus booster?"     No breaks in skin 10. PREGNANCY: "Is there any chance you are pregnant?" "When was your last menstrual period?"       N/A  Protocols used: CHEST INJURY-A-AH

## 2019-06-08 ENCOUNTER — Encounter: Payer: Self-pay | Admitting: *Deleted

## 2019-06-13 ENCOUNTER — Other Ambulatory Visit: Payer: Self-pay

## 2019-06-13 ENCOUNTER — Other Ambulatory Visit
Admission: RE | Admit: 2019-06-13 | Discharge: 2019-06-13 | Disposition: A | Discharge status: Home / Self Care | Payer: Medicare HMO | Source: Ambulatory Visit | Attending: Ophthalmology | Admitting: Ophthalmology

## 2019-06-13 DIAGNOSIS — Z01812 Encounter for preprocedural laboratory examination: Secondary | ICD-10-CM | POA: Diagnosis present

## 2019-06-13 DIAGNOSIS — Z20828 Contact with and (suspected) exposure to other viral communicable diseases: Secondary | ICD-10-CM | POA: Diagnosis not present

## 2019-06-13 LAB — SARS CORONAVIRUS 2 (TAT 6-24 HRS): SARS Coronavirus 2: NEGATIVE

## 2019-06-16 ENCOUNTER — Ambulatory Visit
Admission: RE | Admit: 2019-06-16 | Discharge: 2019-06-16 | Disposition: A | Discharge status: Home / Self Care | Payer: Medicare HMO | Attending: Ophthalmology | Admitting: Ophthalmology

## 2019-06-16 ENCOUNTER — Encounter: Payer: Self-pay | Admitting: *Deleted

## 2019-06-16 ENCOUNTER — Encounter
Admission: RE | Disposition: A | Discharge status: Home / Self Care | Payer: Self-pay | Source: Home / Self Care | Attending: Ophthalmology

## 2019-06-16 ENCOUNTER — Ambulatory Visit: Payer: Medicare HMO | Admitting: Anesthesiology

## 2019-06-16 DIAGNOSIS — E669 Obesity, unspecified: Secondary | ICD-10-CM | POA: Diagnosis not present

## 2019-06-16 DIAGNOSIS — G8929 Other chronic pain: Secondary | ICD-10-CM | POA: Insufficient documentation

## 2019-06-16 DIAGNOSIS — G4730 Sleep apnea, unspecified: Secondary | ICD-10-CM | POA: Insufficient documentation

## 2019-06-16 DIAGNOSIS — Z79891 Long term (current) use of opiate analgesic: Secondary | ICD-10-CM | POA: Insufficient documentation

## 2019-06-16 DIAGNOSIS — K219 Gastro-esophageal reflux disease without esophagitis: Secondary | ICD-10-CM | POA: Insufficient documentation

## 2019-06-16 DIAGNOSIS — Z9221 Personal history of antineoplastic chemotherapy: Secondary | ICD-10-CM | POA: Insufficient documentation

## 2019-06-16 DIAGNOSIS — Z8673 Personal history of transient ischemic attack (TIA), and cerebral infarction without residual deficits: Secondary | ICD-10-CM | POA: Diagnosis not present

## 2019-06-16 DIAGNOSIS — Z853 Personal history of malignant neoplasm of breast: Secondary | ICD-10-CM | POA: Insufficient documentation

## 2019-06-16 DIAGNOSIS — E1140 Type 2 diabetes mellitus with diabetic neuropathy, unspecified: Secondary | ICD-10-CM | POA: Diagnosis not present

## 2019-06-16 DIAGNOSIS — H2512 Age-related nuclear cataract, left eye: Secondary | ICD-10-CM | POA: Insufficient documentation

## 2019-06-16 DIAGNOSIS — Z923 Personal history of irradiation: Secondary | ICD-10-CM | POA: Insufficient documentation

## 2019-06-16 DIAGNOSIS — Z794 Long term (current) use of insulin: Secondary | ICD-10-CM | POA: Insufficient documentation

## 2019-06-16 DIAGNOSIS — J449 Chronic obstructive pulmonary disease, unspecified: Secondary | ICD-10-CM | POA: Diagnosis not present

## 2019-06-16 DIAGNOSIS — Z9981 Dependence on supplemental oxygen: Secondary | ICD-10-CM | POA: Diagnosis not present

## 2019-06-16 DIAGNOSIS — E1136 Type 2 diabetes mellitus with diabetic cataract: Secondary | ICD-10-CM | POA: Insufficient documentation

## 2019-06-16 DIAGNOSIS — Z6828 Body mass index (BMI) 28.0-28.9, adult: Secondary | ICD-10-CM | POA: Diagnosis not present

## 2019-06-16 DIAGNOSIS — E7800 Pure hypercholesterolemia, unspecified: Secondary | ICD-10-CM | POA: Diagnosis not present

## 2019-06-16 DIAGNOSIS — Z7982 Long term (current) use of aspirin: Secondary | ICD-10-CM | POA: Insufficient documentation

## 2019-06-16 DIAGNOSIS — I252 Old myocardial infarction: Secondary | ICD-10-CM | POA: Diagnosis not present

## 2019-06-16 DIAGNOSIS — G629 Polyneuropathy, unspecified: Secondary | ICD-10-CM | POA: Diagnosis not present

## 2019-06-16 DIAGNOSIS — Z87891 Personal history of nicotine dependence: Secondary | ICD-10-CM | POA: Insufficient documentation

## 2019-06-16 DIAGNOSIS — I10 Essential (primary) hypertension: Secondary | ICD-10-CM | POA: Insufficient documentation

## 2019-06-16 DIAGNOSIS — M549 Dorsalgia, unspecified: Secondary | ICD-10-CM | POA: Diagnosis not present

## 2019-06-16 DIAGNOSIS — E785 Hyperlipidemia, unspecified: Secondary | ICD-10-CM | POA: Diagnosis not present

## 2019-06-16 HISTORY — DX: Polyneuropathy, unspecified: G62.9

## 2019-06-16 HISTORY — DX: Bronchitis, not specified as acute or chronic: J40

## 2019-06-16 HISTORY — PX: CATARACT EXTRACTION W/PHACO: SHX586

## 2019-06-16 HISTORY — DX: Heart failure, unspecified: I50.9

## 2019-06-16 HISTORY — DX: Orthopnea: R06.01

## 2019-06-16 LAB — GLUCOSE, CAPILLARY: Glucose-Capillary: 109 mg/dL — ABNORMAL HIGH (ref 70–99)

## 2019-06-16 SURGERY — PHACOEMULSIFICATION, CATARACT, WITH IOL INSERTION
Anesthesia: Monitor Anesthesia Care | Site: Eye | Laterality: Left

## 2019-06-16 MED ORDER — DEXMEDETOMIDINE HCL 200 MCG/2ML IV SOLN
INTRAVENOUS | Status: DC | PRN
  Administered 2019-06-16 (×2): 4 ug via INTRAVENOUS

## 2019-06-16 MED ORDER — MOXIFLOXACIN HCL 0.5 % OP SOLN
OPHTHALMIC | Status: AC
  Filled 2019-06-16: qty 3

## 2019-06-16 MED ORDER — ARMC OPHTHALMIC DILATING DROPS
1.0000 "application " | OPHTHALMIC | Status: AC
  Administered 2019-06-16 (×3): 1 via OPHTHALMIC

## 2019-06-16 MED ORDER — ONDANSETRON HCL 4 MG/2ML IJ SOLN
INTRAMUSCULAR | Status: DC | PRN
  Administered 2019-06-16: 4 mg via INTRAVENOUS

## 2019-06-16 MED ORDER — MOXIFLOXACIN HCL 0.5 % OP SOLN: 1.0000 [drp] | Freq: Once | OPHTHALMIC | Status: DC

## 2019-06-16 MED ORDER — NA CHONDROIT SULF-NA HYALURON 40-17 MG/ML IO SOLN
INTRAOCULAR | Status: DC | PRN
  Administered 2019-06-16: 1 mL via INTRAOCULAR

## 2019-06-16 MED ORDER — POVIDONE-IODINE 5 % OP SOLN
OPHTHALMIC | Status: DC | PRN
  Administered 2019-06-16: 1 via OPHTHALMIC

## 2019-06-16 MED ORDER — TETRACAINE HCL 0.5 % OP SOLN
1.0000 [drp] | Freq: Two times a day (BID) | OPHTHALMIC | Status: DC
  Administered 2019-06-16: 08:00:00 1 [drp] via OPHTHALMIC

## 2019-06-16 MED ORDER — FENTANYL CITRATE (PF) 100 MCG/2ML IJ SOLN
INTRAMUSCULAR | Status: DC | PRN
  Administered 2019-06-16 (×4): 25 ug via INTRAVENOUS

## 2019-06-16 MED ORDER — LIDOCAINE HCL (PF) 4 % IJ SOLN
INTRAOCULAR | Status: DC | PRN
  Administered 2019-06-16: 2 mL via OPHTHALMIC

## 2019-06-16 MED ORDER — MIDAZOLAM HCL 2 MG/2ML IJ SOLN
INTRAMUSCULAR | Status: AC
  Filled 2019-06-16: qty 2

## 2019-06-16 MED ORDER — EPINEPHRINE PF 1 MG/ML IJ SOLN
INTRAOCULAR | Status: DC | PRN
  Administered 2019-06-16: 200 mL via OPHTHALMIC

## 2019-06-16 MED ORDER — SODIUM CHLORIDE 0.9 % IV SOLN
INTRAVENOUS | Status: DC
  Administered 2019-06-16: 07:00:00 via INTRAVENOUS

## 2019-06-16 MED ORDER — TETRACAINE HCL 0.5 % OP SOLN
OPHTHALMIC | Status: AC
  Administered 2019-06-16: 1 [drp] via OPHTHALMIC
  Filled 2019-06-16: qty 4

## 2019-06-16 MED ORDER — MIDAZOLAM HCL 2 MG/2ML IJ SOLN
INTRAMUSCULAR | Status: DC | PRN
  Administered 2019-06-16 (×3): 0.5 mg via INTRAVENOUS
  Administered 2019-06-16: .5 mg via INTRAVENOUS

## 2019-06-16 MED ORDER — MOXIFLOXACIN HCL 0.5 % OP SOLN
OPHTHALMIC | Status: DC | PRN
  Administered 2019-06-16: 0.2 mL via OPHTHALMIC

## 2019-06-16 MED ORDER — FENTANYL CITRATE (PF) 100 MCG/2ML IJ SOLN
INTRAMUSCULAR | Status: AC
  Filled 2019-06-16: qty 2

## 2019-06-16 MED ORDER — TRYPAN BLUE 0.06 % OP SOLN
OPHTHALMIC | Status: DC | PRN
  Administered 2019-06-16: 0.5 mL via INTRAOCULAR

## 2019-06-16 MED ORDER — NA HYALUR & NA CHOND-NA HYALUR 0.55-0.5 ML IO KIT
PACK | INTRAOCULAR | Status: DC | PRN
  Administered 2019-06-16: 1 via INTRAOCULAR

## 2019-06-16 MED ORDER — ARMC OPHTHALMIC DILATING DROPS
OPHTHALMIC | Status: AC
  Administered 2019-06-16: 1 via OPHTHALMIC
  Filled 2019-06-16: qty 0.5

## 2019-06-16 SURGICAL SUPPLY — 17 items
DISSECTOR HYDRO NUCLEUS 50X22 (MISCELLANEOUS) ×8 IMPLANT
DRSG TEGADERM 2-3/8X2-3/4 SM (GAUZE/BANDAGES/DRESSINGS) ×2 IMPLANT
GLOVE BIOGEL M 6.5 STRL (GLOVE) ×2 IMPLANT
GOWN STRL REUS W/ TWL LRG LVL3 (GOWN DISPOSABLE) ×1 IMPLANT
GOWN STRL REUS W/ TWL XL LVL3 (GOWN DISPOSABLE) ×1 IMPLANT
GOWN STRL REUS W/TWL LRG LVL3 (GOWN DISPOSABLE) ×1
GOWN STRL REUS W/TWL XL LVL3 (GOWN DISPOSABLE) ×1
KNIFE 45D UP 2.3 (MISCELLANEOUS) ×2 IMPLANT
LABEL CATARACT MEDS ST (LABEL) ×2 IMPLANT
LENS IOL TECNIS ITEC 20.5 (Intraocular Lens) ×2 IMPLANT
PACK CATARACT (MISCELLANEOUS) ×2 IMPLANT
PACK CATARACT KING (MISCELLANEOUS) ×2 IMPLANT
PACK EYE AFTER SURG (MISCELLANEOUS) ×2 IMPLANT
SOL BSS BAG (MISCELLANEOUS) ×2
SOLUTION BSS BAG (MISCELLANEOUS) ×1 IMPLANT
WATER STERILE IRR 250ML POUR (IV SOLUTION) ×2 IMPLANT
WIPE NON LINTING 3.25X3.25 (MISCELLANEOUS) ×2 IMPLANT

## 2019-06-16 NOTE — Discharge Instructions (Signed)
Eye Surgery Discharge Instructions    Expect mild scratchy sensation or mild soreness. DO NOT RUB YOUR EYE!  The day of surgery:  Minimal physical activity, but bed rest is not required  No reading, computer work, or close hand work  No bending, lifting, or straining.  May watch TV  For 24 hours:  No driving, legal decisions, or alcoholic beverages  Safety precautions  Eat anything you prefer: It is better to start with liquids, then soup then solid foods.  _____ Eye patch should be worn until postoperative exam tomorrow.  ____ Solar shield eyeglasses should be worn for comfort in the sunlight/patch while sleeping  Resume all regular medications including aspirin or Coumadin if these were discontinued prior to surgery. You may shower, bathe, shave, or wash your hair. Tylenol may be taken for mild discomfort.  Call your doctor if you experience significant pain, nausea, or vomiting, fever > 101 or other signs of infection. [phone] or [phone] Specific instructions:  Follow-up Information    Marchia Meiers, MD Follow up on 06/17/2019.   Specialty: Ophthalmology Why: @ 9:30 am for psot op visit Contact information: [address] [place] [address] [phone]

## 2019-06-16 NOTE — Anesthesia Preprocedure Evaluation (Addendum)
Anesthesia Evaluation  Patient identified by MRN, date of birth, ID band Patient awake    Reviewed: Allergy & Precautions, H&P , NPO status , reviewed documented beta blocker date and time   Airway Mallampati: III  TM Distance: >3 FB Neck ROM: full    Dental  (+) Upper Dentures, Lower Dentures   Pulmonary shortness of breath, asthma , sleep apnea , pneumonia, resolved, COPD, former smoker,    Pulmonary exam normal        Cardiovascular hypertension, + Past MI, +CHF and + Orthopnea  Normal cardiovascular exam     Neuro/Psych PSYCHIATRIC DISORDERS Depression CVA    GI/Hepatic GERD  Medicated and Controlled,  Endo/Other  diabetes  Renal/GU      Musculoskeletal  (+) Arthritis ,   Abdominal   Peds  Hematology  (+) Blood dyscrasia, anemia ,   Anesthesia Other Findings Past Medical History: No date: Arthritis No date: Asthma 1998: Breast cancer ([place])     Comment:  right breast ca with mastectomy and chemotherapy and               radiation No date: Bronchitis No date: CHF (congestive heart failure) (HCC)     Comment:  "with Morphine withdrawal" No date: COPD (chronic obstructive pulmonary disease) (HCC) No date: Diabetes mellitus without complication (HCC) No date: Diverticulitis     Comment:  diverticulosis also No date: Dyspnea No date: Endometriosis No date: GERD (gastroesophageal reflux disease) 2000-2005: History of shingles No date: Hypercholesteremia No date: Hypertension No date: IBS (irritable bowel syndrome) No date: Low back pain     Comment:  a. Implanted morphine/bupivicaine/clonidine pump. No date: Neuropathy No date: Orthopnea No date: Oxygen dependent     Comment:  uses at night No date: Personal history of chemotherapy No date: Personal history of radiation therapy No date: Pneumonia     Comment:  pneumonia 5-6 times, history of bronchitis also No date: Scoliosis No date: Sleep apnea   Comment:  does not use cpap 2010: Stroke [facility])     Comment:  TIA, 10 years ago No date: Withdrawal from sedative drug ([place])     Comment:  withdrawal from morphine when pump batteries died  Past Surgical History: 1987: ABDOMINAL HYSTERECTOMY No date: BACK SURGERY     Comment:  Tailbone removed following fracture No date: BREAST SURGERY; Right     Comment:  mastectomy No date: CARDIAC CATHETERIZATION 05/19/2019: CATARACT EXTRACTION W/PHACO; Right     Comment:  Procedure: CATARACT EXTRACTION PHACO AND INTRAOCULAR               LENS PLACEMENT ([place]), RIGHT, DIABETIC;  Surgeon: Marchia Meiers, MD;  Location: [facility] ORS;  Service: Ophthalmology;               Laterality: Right;  Lot # [id number] H [place]: 00:43.8 CDE:               4.59 No date: COCCYX REMOVAL 01/01/2017: COLONOSCOPY WITH PROPOFOL; N/A     Comment:  Procedure: COLONOSCOPY WITH PROPOFOL;  Surgeon: Jonathon Bellows, MD;  [facility] ENDOSCOPY;  Service:               Endoscopy;  Laterality: N/A; No date: ELBOW ARTHROSCOPY WITH TENDON RECONSTRUCTION 01/01/2017: ESOPHAGOGASTRODUODENOSCOPY (EGD) WITH PROPOFOL; N/A     Comment:  Procedure: ESOPHAGOGASTRODUODENOSCOPY (EGD) WITH  PROPOFOL;  Surgeon: Jonathon Bellows, MD;  Location: [facility]               ENDOSCOPY;  Service: Endoscopy;  Laterality: N/A; No date: INTRATHECAL PUMP IMPLANT 04/28/2017: LEFT HEART CATH AND CORONARY ANGIOGRAPHY; N/A     Comment:  Procedure: LEFT HEART CATH AND CORONARY ANGIOGRAPHY;                Surgeon: [name], MD;  Location: [place]               CV LAB;  Service: Cardiovascular;  Laterality: N/A; 06/1997: MASTECTOMY; Right 2011: morphine pump No date: PLANTAR FASCIA RELEASE No date: TRIGGER FINGER RELEASE  BMI    Body Mass Index: 28.68 kg/m      Reproductive/Obstetrics                            Anesthesia Physical Anesthesia Plan  ASA: III  Anesthesia Plan: MAC   Post-op  Pain Management:    Induction: Intravenous  PONV Risk Score and Plan: Treatment may vary due to age or medical condition and TIVA  Airway Management Planned: Nasal Cannula and Natural Airway  Additional Equipment:   Intra-op Plan:   Post-operative Plan:   Informed Consent: I have reviewed the patients History and Physical, chart, labs and discussed the procedure including the risks, benefits and alternatives for the proposed anesthesia with the patient or authorized representative who has indicated his/her understanding and acceptance.     Dental Advisory Given  Plan Discussed with: CRNA  Anesthesia Plan Comments:         Anesthesia Quick Evaluation

## 2019-06-16 NOTE — Anesthesia Post-op Follow-up Note (Signed)
Anesthesia QCDR form completed.        

## 2019-06-16 NOTE — Transfer of Care (Signed)
Immediate Anesthesia Transfer of Care Note  Patient: Jordan Chan  Procedure(s) Performed: CATARACT EXTRACTION PHACO AND INTRAOCULAR LENS PLACEMENT (IOC) LEFT VISION BLUE DIABETIC (Left Eye)  Patient Location: PACU  Anesthesia Type:MAC  Level of Consciousness: awake, alert  and oriented  Airway & Oxygen Therapy: Patient Spontanous Breathing  Post-op Assessment: Report given to RN and Post -op Vital signs reviewed and stable  Post vital signs: Reviewed and stable  Last Vitals:  Vitals Value Taken Time  BP 116/53 06/16/19 0906  Temp    Pulse    Resp 20 06/16/19 0906  SpO2 98 % 06/16/19 0906    Last Pain:  Vitals:   06/16/19 0639  TempSrc: Tympanic  PainSc: 0-No pain         Complications: No anesthetic compications

## 2019-06-16 NOTE — Op Note (Signed)
  PREOPERATIVE DIAGNOSIS:  Nuclear sclerotic cataract of the LEFT eye.   POSTOPERATIVE DIAGNOSIS:  Nuclear sclerotic cataract of the LEFT eye.   OPERATIVE PROCEDURE: Cataract surgery OS   SURGEON:  Marchia Meiers, MD.   ANESTHESIA:  Anesthesiologist: Alphonsus Sias, MD CRNA: Disser, Dierdre Forth, CRNA; Jonna Clark, CRNA  1.      Managed anesthesia care. 2.     0.82ml of Shugarcaine was instilled following the paracentesis   COMPLICATIONS:  None.   TECHNIQUE:   Divide and conquer   DESCRIPTION OF PROCEDURE:  The patient was examined and consented in the preoperative holding area where the aforementioned topical anesthesia was applied to the LEFT eye and then brought back to the Operating Room where the left eye was prepped and draped in the usual sterile ophthalmic fashion and a lid speculum was placed. A paracentesis was created with the side port blade, the anterior chamber was washed out with trypan blue to stain the anterior capsule, and the anterior chamber was filled with viscoelastic. A near clear corneal incision was performed with the steel keratome. A continuous curvilinear capsulorrhexis was performed with a cystotome followed by the capsulorrhexis forceps. Hydrodissection and hydrodelineation were carried out with BSS on a blunt cannula. The lens was removed in a divide and conquer  technique and the remaining cortical material was removed with the irrigation-aspiration handpiece. The capsular bag was inflated with viscoelastic and the lens was placed in the capsular bag without complication. The remaining viscoelastic was removed from the eye with the irrigation-aspiration handpiece. The wounds were hydrated. The anterior chamber was flushed and the eye was inflated to physiologic pressure. 0.16ml Vigamox was placed in the anterior chamber. The wounds were found to be water tight. The eye was dressed with Vigamox. The patient was given protective glasses to wear throughout the day and  a shield with which to sleep tonight. The patient was also given drops with which to begin a drop regimen today and will follow-up with me in one day. Implant Name Type Inv. Item Serial No. Manufacturer Lot No. LRB No. Used Action  LENS IOL DIOP 20.5 - [id number] Intraocular Lens LENS IOL DIOP 20.5 [id number] AMO  Left 1 Implanted    Procedure(s) with comments: CATARACT EXTRACTION PHACO AND INTRAOCULAR LENS PLACEMENT (IOC) LEFT VISION BLUE DIABETIC (Left) - Lot [id number] H [place]: 00:46.9 CDE: 6.53  Electronically signed: Marchia Meiers 06/16/2019 1:13 PM

## 2019-06-16 NOTE — H&P (Signed)
   I have reviewed the patient's H&P and agree with its findings. There have been no interval changes.  Hudsen Fei MD Ophthalmology 

## 2019-06-17 ENCOUNTER — Encounter: Payer: Self-pay | Admitting: Ophthalmology

## 2019-06-20 DIAGNOSIS — M792 Neuralgia and neuritis, unspecified: Secondary | ICD-10-CM | POA: Diagnosis not present

## 2019-06-20 DIAGNOSIS — G894 Chronic pain syndrome: Secondary | ICD-10-CM | POA: Diagnosis not present

## 2019-06-20 DIAGNOSIS — M5442 Lumbago with sciatica, left side: Secondary | ICD-10-CM | POA: Diagnosis not present

## 2019-06-20 DIAGNOSIS — G893 Neoplasm related pain (acute) (chronic): Secondary | ICD-10-CM | POA: Diagnosis not present

## 2019-06-23 NOTE — Anesthesia Postprocedure Evaluation (Signed)
Anesthesia Post Note  Patient: Jordan Chan  Procedure(s) Performed: CATARACT EXTRACTION PHACO AND INTRAOCULAR LENS PLACEMENT (IOC) LEFT VISION BLUE DIABETIC (Left Eye)  Patient location during evaluation: Phase II Anesthesia Type: MAC Level of consciousness: awake and alert Pain management: pain level controlled Vital Signs Assessment: post-procedure vital signs reviewed and stable Respiratory status: spontaneous breathing, nonlabored ventilation and respiratory function stable Cardiovascular status: blood pressure returned to baseline and stable Postop Assessment: no apparent nausea or vomiting Anesthetic complications: no     Last Vitals:  Vitals:   06/16/19 0907 06/16/19 0921  BP: (!) 116/53 (!) 129/54  Pulse: 76 80  Resp: 16 16  Temp: (!) 36.2 C   SpO2: 98% 100%    Last Pain:  Vitals:   06/17/19 0851  TempSrc:   PainSc: 0-No pain                 Alphonsus Sias

## 2019-07-05 ENCOUNTER — Ambulatory Visit (INDEPENDENT_AMBULATORY_CARE_PROVIDER_SITE_OTHER): Payer: Medicare HMO

## 2019-07-05 ENCOUNTER — Other Ambulatory Visit: Payer: Self-pay

## 2019-07-05 DIAGNOSIS — E538 Deficiency of other specified B group vitamins: Secondary | ICD-10-CM

## 2019-07-17 ENCOUNTER — Encounter: Payer: Self-pay | Admitting: Nurse Practitioner

## 2019-07-17 DIAGNOSIS — E559 Vitamin D deficiency, unspecified: Secondary | ICD-10-CM | POA: Insufficient documentation

## 2019-07-19 ENCOUNTER — Ambulatory Visit (INDEPENDENT_AMBULATORY_CARE_PROVIDER_SITE_OTHER): Payer: Medicare HMO | Admitting: Nurse Practitioner

## 2019-07-19 ENCOUNTER — Encounter: Payer: Self-pay | Admitting: Nurse Practitioner

## 2019-07-19 ENCOUNTER — Other Ambulatory Visit: Payer: Self-pay

## 2019-07-19 ENCOUNTER — Ambulatory Visit (INDEPENDENT_AMBULATORY_CARE_PROVIDER_SITE_OTHER): Payer: Medicare HMO | Admitting: Pharmacist

## 2019-07-19 VITALS — BP 130/61

## 2019-07-19 DIAGNOSIS — F324 Major depressive disorder, single episode, in partial remission: Secondary | ICD-10-CM | POA: Diagnosis not present

## 2019-07-19 DIAGNOSIS — E1159 Type 2 diabetes mellitus with other circulatory complications: Secondary | ICD-10-CM | POA: Diagnosis not present

## 2019-07-19 DIAGNOSIS — Z978 Presence of other specified devices: Secondary | ICD-10-CM | POA: Diagnosis not present

## 2019-07-19 DIAGNOSIS — J449 Chronic obstructive pulmonary disease, unspecified: Secondary | ICD-10-CM

## 2019-07-19 DIAGNOSIS — E559 Vitamin D deficiency, unspecified: Secondary | ICD-10-CM | POA: Diagnosis not present

## 2019-07-19 DIAGNOSIS — I152 Hypertension secondary to endocrine disorders: Secondary | ICD-10-CM

## 2019-07-19 DIAGNOSIS — E1140 Type 2 diabetes mellitus with diabetic neuropathy, unspecified: Secondary | ICD-10-CM

## 2019-07-19 DIAGNOSIS — M545 Low back pain, unspecified: Secondary | ICD-10-CM

## 2019-07-19 DIAGNOSIS — E1169 Type 2 diabetes mellitus with other specified complication: Secondary | ICD-10-CM

## 2019-07-19 DIAGNOSIS — I10 Essential (primary) hypertension: Secondary | ICD-10-CM

## 2019-07-19 DIAGNOSIS — Z794 Long term (current) use of insulin: Secondary | ICD-10-CM | POA: Diagnosis not present

## 2019-07-19 DIAGNOSIS — E785 Hyperlipidemia, unspecified: Secondary | ICD-10-CM

## 2019-07-19 DIAGNOSIS — G8929 Other chronic pain: Secondary | ICD-10-CM

## 2019-07-19 MED ORDER — TRELEGY ELLIPTA 100-62.5-25 MCG/INH IN AEPB: 1.0000 | INHALATION_SPRAY | Freq: Every day | RESPIRATORY_TRACT | 6 refills | Status: DC

## 2019-07-19 MED ORDER — VENLAFAXINE HCL ER 150 MG PO CP24: 150.0000 mg | ORAL_CAPSULE | Freq: Every day | ORAL | 3 refills | Status: DC

## 2019-07-19 NOTE — Assessment & Plan Note (Signed)
Continue to collaborate with pain clinic in [facility].

## 2019-07-19 NOTE — Assessment & Plan Note (Signed)
Chronic, stable.  Denies SI/HI.  Mood well-controlled.  Continue current medication regimen and adjust as needed.  Refills sent.

## 2019-07-19 NOTE — Patient Instructions (Signed)
Visit Information  Goals Addressed            This Visit's Progress     Patient Stated   . "I want to get my blood sugars under control" (pt-stated)       Current Barriers:  . Diabetes: controled, most recent A1c 6.7%  . Current antihyperglycemic regimen: Trulicity 1.5 mg, Tresiba 12-20 units QAM, metformin 1000 mg BID o Notes that she is adjusting her Tyler Aas, sometimes holding her Tyler Aas, if she is having low blood sugar readings.  . Does note that she is having hypoglycemic episodes: 60-70s, both in the morning and later in the day;  . Current meal patterns: o Continues to avoid sweet tea as much as she can, does not drink any after lunch . Current blood glucose readings: Fasting readings all <130, mostly <100. Does have a few readings >200, but unsure what time of the day these were . Cardiovascular risk reduction: o Current hypertensive regimen: lisinopril 5 mg QAM, metoprolol succinate 12.5 mg daily; BP well controlled at office appointments <140/90 o Current hyperlipidemia regimen: atorvastatin 20 mg daily, LDL at goal <70;  . COPD; follows w/ [name] at [facility] pulmonary. Currently managed on Advair 250/50 mcg BID and Spiriva 18 mcg daily. Notes that her breathing is "a little bit worse" lately.   Pharmacist Clinical Goal(s):  [name] Over the next 90 days, patient with work with PharmD and primary care provider to address optimized diabetes management  Interventions: . Comprehensive medication management review performed, medication list updated in electronic medical record . Reviewed patient's history of self-adjusting and occasionally skipping [place]. Will collaborate w/ [name], NP about options moving forward. Consider d/c Tresiba, and increase Trulicity to 3 mg weekly if needed to maintain goal A1c <7% . Reviewed [employer] formulary. Trelegy is covered. Consider discussing this w/ [name] at patient's next appt in March.  Patient Self Care Activities:  . Patient will  check blood glucose BID, document, and provide at future appointments . Patient will report any questions or concerns to provider   Please see past updates related to this goal by clicking on the "Past Updates" button in the selected goal         The patient verbalized understanding of instructions provided today and declined a print copy of patient instruction materials.   Plan: - Will collaborate w/ [name], NP on above recommendations for patient's upcoming PCP appointment - Scheduled f/u with patient in ~6 weeks for continued medication management support (08/30/2019 at 9 am)  Catie Darnelle Maffucci, PharmD, [place] [phone]

## 2019-07-19 NOTE — Assessment & Plan Note (Signed)
Recheck Vitamin D and adjust supplement as needed.

## 2019-07-19 NOTE — Assessment & Plan Note (Signed)
Chronic, ongoing with recent increase in symptoms.  Will discontinue Advair and Spiriva, switch to Trelegy which offers benefit of medication minimization and may decrease current symptoms.  Script sent.  Plan to return in 4 weeks to reassess.  Needs spirometry.

## 2019-07-19 NOTE — Patient Instructions (Signed)
Carbohydrate Counting for Diabetes Mellitus, Adult  Carbohydrate counting is a method of keeping track of how many carbohydrates you eat. Eating carbohydrates naturally increases the amount of sugar (glucose) in the blood. Counting how many carbohydrates you eat helps keep your blood glucose within normal limits, which helps you manage your diabetes (diabetes mellitus). It is important to know how many carbohydrates you can safely have in each meal. This is different for every person. A diet and nutrition specialist (registered dietitian) can help you make a meal plan and calculate how many carbohydrates you should have at each meal and snack. Carbohydrates are found in the following foods:  Grains, such as breads and cereals.  Dried beans and soy products.  Starchy vegetables, such as potatoes, peas, and corn.  Fruit and fruit juices.  Milk and yogurt.  Sweets and snack foods, such as cake, cookies, candy, chips, and soft drinks. How do I count carbohydrates? There are two ways to count carbohydrates in food. You can use either of the methods or a combination of both. Reading "Nutrition Facts" on packaged food The "Nutrition Facts" list is included on the labels of almost all packaged foods and beverages in the [place] It includes:  The serving size.  Information about nutrients in each serving, including the grams (g) of carbohydrate per serving. To use the "Nutrition Facts":  Decide how many servings you will have.  Multiply the number of servings by the number of carbohydrates per serving.  The resulting number is the total amount of carbohydrates that you will be having. Learning standard serving sizes of other foods When you eat carbohydrate foods that are not packaged or do not include "Nutrition Facts" on the label, you need to measure the servings in order to count the amount of carbohydrates:  Measure the foods that you will eat with a food scale or measuring cup, if needed.   Decide how many standard-size servings you will eat.  Multiply the number of servings by 15. Most carbohydrate-rich foods have about 15 g of carbohydrates per serving. ? For example, if you eat 8 oz (170 g) of strawberries, you will have eaten 2 servings and 30 g of carbohydrates (2 servings x 15 g = 30 g).  For foods that have more than one food mixed, such as soups and casseroles, you must count the carbohydrates in each food that is included. The following list contains standard serving sizes of common carbohydrate-rich foods. Each of these servings has about 15 g of carbohydrates:   hamburger bun or  English muffin.   oz (15 mL) syrup.   oz (14 g) jelly.  1 slice of bread.  1 six-inch tortilla.  3 oz (85 g) cooked rice or pasta.  4 oz (113 g) cooked dried beans.  4 oz (113 g) starchy vegetable, such as peas, corn, or potatoes.  4 oz (113 g) hot cereal.  4 oz (113 g) mashed potatoes or  of a large baked potato.  4 oz (113 g) canned or frozen fruit.  4 oz (120 mL) fruit juice.  4-6 crackers.  6 chicken nuggets.  6 oz (170 g) unsweetened dry cereal.  6 oz (170 g) plain fat-free yogurt or yogurt sweetened with artificial sweeteners.  8 oz (240 mL) milk.  8 oz (170 g) fresh fruit or one small piece of fruit.  24 oz (680 g) popped popcorn. Example of carbohydrate counting Sample meal  3 oz (85 g) chicken breast.  6 oz (170 g)   brown rice.  4 oz (113 g) corn.  8 oz (240 mL) milk.  8 oz (170 g) strawberries with sugar-free whipped topping. Carbohydrate calculation 1. Identify the foods that contain carbohydrates: ? Rice. ? Corn. ? Milk. ? Strawberries. 2. Calculate how many servings you have of each food: ? 2 servings rice. ? 1 serving corn. ? 1 serving milk. ? 1 serving strawberries. 3. Multiply each number of servings by 15 g: ? 2 servings rice x 15 g = 30 g. ? 1 serving corn x 15 g = 15 g. ? 1 serving milk x 15 g = 15 g. ? 1 serving  strawberries x 15 g = 15 g. 4. Add together all of the amounts to find the total grams of carbohydrates eaten: ? 30 g + 15 g + 15 g + 15 g = 75 g of carbohydrates total. Summary  Carbohydrate counting is a method of keeping track of how many carbohydrates you eat.  Eating carbohydrates naturally increases the amount of sugar (glucose) in the blood.  Counting how many carbohydrates you eat helps keep your blood glucose within normal limits, which helps you manage your diabetes.  A diet and nutrition specialist (registered dietitian) can help you make a meal plan and calculate how many carbohydrates you should have at each meal and snack. This information is not intended to replace advice given to you by your health care provider. Make sure you discuss any questions you have with your health care provider. Document Released: 07/21/2005 Document Revised: 02/12/2017 Document Reviewed: 01/02/2016 Elsevier Patient Education  2020 Elsevier Inc.  

## 2019-07-19 NOTE — Progress Notes (Signed)
BP 130/61   LMP  (LMP Unknown)    Subjective:    [name], female    DOB: 22-Apr-1951, 68 y.o.   MRN: [id number]  HPI: Jordan Chan is a 68 y.o. female  Chief Complaint  Patient presents with  . Depression  . Diabetes  . Hyperlipidemia  . Hypertension    . This visit was completed via telephone due to the restrictions of the COVID-19 pandemic. All issues as above were discussed and addressed but no physical exam was performed. If it was felt that the patient should be evaluated in the office, they were directed there. The patient verbally consented to this visit. Patient was unable to complete an audio/visual visit due to Lack of equipment. Due to the catastrophic nature of the COVID-19 pandemic, this visit was done through audio contact only. . Location of the patient: home . Location of the provider: work . Those involved with this call:  . Provider: [name], DNP . CMA: Yvonna Alanis, CMA . Front Desk/Registration: Jill Side  . Time spent on call: 15 minutes on the phone discussing health concerns. 10 minutes total spent in review of patient's record and preparation of their chart.  . I verified patient identity using two factors (patient name and date of birth). Patient consents verbally to being seen via telemedicine visit today.    DIABETES On Trulicity, taking 1.5 MG weekly.  Continues on Metformin 1000 MG BID and Tresiba 25 units at HS.  Working with CCM team and provider. Recent A1C in September 6.7%.  Endorses increase in recent low BS readings. Hypoglycemic episodes:yes, maybe 12 over past few months (she takes glucose tablets), at times is not taking [place] (maybe taking 3 times a week) Polydipsia/polyuria: no Visual disturbance: no Chest pain: no Paresthesias: no Glucose Monitoring: yes  Accucheck frequency: BID  Fasting glucose: 100 to 130  Post prandial: after lunch 130-150, occasional is >200  Evening:   Before  meals: Taking Insulin?: yes  Long acting insulin: Tresiba 25 units a few times a week  Short acting insulin: Blood Pressure Monitoring: just got cuff yesterday Retinal Examination: Up to Date Foot Exam: Up to Date Pneumovax: Up to Date Influenza: Up to Date Aspirin: yes   HYPERTENSION / HYPERLIPIDEMIA Continues on Atorvastatin 20 MG, Lisinopril 5 MG, Metoprolol 12.5 MG daily + ASA. Satisfied with current treatment? yes Duration of hypertension: chronic BP monitoring frequency: just got a cuff yesterday BP range:  120-130/60-70 BP medication side effects: no Duration of hyperlipidemia: chronic Cholesterol medication side effects: no Cholesterol supplements: none Medication compliance: good compliance Aspirin: yes Recent stressors: no Recurrent headaches: no Visual changes: no Palpitations: no Dyspnea: no Chest pain: no Lower extremity edema: no Dizzy/lightheaded: no   COPD Currently using Spiriva and Advair, but has been having more SOB lately with wearing masks.  Has increased Albuterol use to 15 times a month.  Has a history of smoking, quit 15 years ago.   COPD status: slightly uncontrolled at this time Satisfied with current treatment?: yes Oxygen use: no Dyspnea frequency: intermittent, especially with having to wear mask Cough frequency: intermittent Rescue inhaler frequency:  15 times this month Limitation of activity: no Productive cough: none Last Spirometry: unknown Pneumovax: Up to Date Influenza: Up to Date  CHRONIC PAIN: Followed by [facility] in [place], last seen 06/20/2019.  Continues to be followed by clinic nurse for pump check and replacement. She states her pain is well-controlled with current regimen.  Vitamin D on low side (17.5) in June, taking supplement.  No recent falls or fractures.  Normal DEXA 2017.  DEPRESSION Continues on Effexor 150 MG daily. Mood status: controlled Satisfied with current treatment?: yes Symptom severity:  mild  Duration of current treatment : chronic Side effects: no Medication compliance: good compliance Psychotherapy/counseling: none Depressed mood: yes Anxious mood: no Anhedonia: no Significant weight loss or gain: no Insomnia: none Fatigue: no Feelings of worthlessness or guilt: no Impaired concentration/indecisiveness: no Suicidal ideations: no Hopelessness: no Crying spells: no Depression screen [facility] 2/9 07/19/2019 01/17/2019 01/17/2019 12/06/2018 07/15/2018  Decreased Interest 0 0 0 0 0  Down, Depressed, Hopeless 0 0 0 0 0  PHQ - 2 Score 0 0 0 0 0  Altered sleeping 3 3 3  - 3  Tired, decreased energy 3 2 2  - 3  Change in appetite 0 0 0 - 0  Feeling bad or failure about yourself  0 0 0 - 0  Trouble concentrating 0 0 0 - 0  Moving slowly or fidgety/restless 0 0 0 - 0  Suicidal thoughts 0 0 0 - 0  PHQ-9 Score 6 5 5  - 6  Difficult doing work/chores Not difficult at all - Not difficult at all - Not difficult at all  Some recent data might be hidden    Relevant past medical, surgical, family and social history reviewed and updated as indicated. Interim medical history since our last visit reviewed. Allergies and medications reviewed and updated.  Review of Systems  Constitutional: Negative for activity change, appetite change, diaphoresis, fatigue and fever.  Respiratory: Negative for cough, chest tightness and shortness of breath.   Cardiovascular: Negative for chest pain, palpitations and leg swelling.  Gastrointestinal: Negative for abdominal distention, abdominal pain, constipation, diarrhea, nausea and vomiting.  Endocrine: Negative for cold intolerance, heat intolerance, polydipsia, polyphagia and polyuria.  Neurological: Negative for dizziness, syncope, weakness, light-headedness, numbness and headaches.  Psychiatric/Behavioral: Negative.     Per HPI unless specifically indicated above     Objective:    BP 130/61   LMP  (LMP Unknown)   Wt Readings from Last 3  Encounters:  06/08/19 142 lb (64.4 kg)  05/19/19 141 lb 1.5 oz (64 kg)  01/17/19 150 lb (68 kg)    Physical Exam  Unable to perform due to telephone visit only  Diabetic Foot Exam - Simple   No data filed      Results for orders placed or performed during the hospital encounter of 06/16/19  Glucose, capillary  Result Value Ref Range   Glucose-Capillary 109 (H) 70 - 99 mg/dL      Assessment & Plan:   Problem List Items Addressed This Visit      Cardiovascular and Mediastinum   Hypertension associated with diabetes ([place])    Chronic, stable with BP below goal.  Continue current medication regimen and adjust as needed.  CMP outpatient.       Relevant Orders   Comprehensive metabolic panel   Bayer DCA Hb A1c Waived     Respiratory   COPD, moderate (HCC)    Chronic, ongoing with recent increase in symptoms.  Will discontinue Advair and Spiriva, switch to Trelegy which offers benefit of medication minimization and may decrease current symptoms.  Script sent.  Plan to return in 4 weeks to reassess.  Needs spirometry.      Relevant Medications   Fluticasone-Umeclidin-Vilant (TRELEGY ELLIPTA) 100-62.5-25 MCG/INH AEPB     Endocrine   Hyperlipidemia associated with type 2  diabetes mellitus (HCC)    Chronic, stable.  Continue current medication regimen and adjust as needed.  Lipid panel and CMP outpatient.      Relevant Orders   Comprehensive metabolic panel   Bayer DCA Hb A1c Waived   78m Lipid Panel   Type 2 diabetes mellitus with diabetic neuropathy, unspecified (HCC) - Primary    Chronic, ongoing with recent  A1C 6.7% and current episodes of BS <70. Will discontinue Tyler Aas and then continue Trulicity at current dose, along with Metformin.  Continue monitoring BS closely.  She is to notify provider if elevations consistently noted.  Plan on follow-up in 4 weeks and if elevations can consider increasing Trulicity.  Praised her for continued success with diabetes.  Obtain A1C  outpatient.      Relevant Orders   Bayer DCA Hb A1c Waived     Other   Presence of intrathecal pump (Chronic)    Continue to collaborate with pain clinic in [facility].      Depression, major, single episode, in partial remission (HCC)    Chronic, stable.  Denies SI/HI.  Mood well-controlled.  Continue current medication regimen and adjust as needed.  Refills sent.        Relevant Medications   venlafaxine XR (EFFEXOR-XR) 150 MG 24 hr capsule   Chronic back pain    Continue to collaborate with chronic pain clinic in [facility].  Has morphine pump.        Relevant Medications   venlafaxine XR (EFFEXOR-XR) 150 MG 24 hr capsule   Vitamin D deficiency    Recheck Vitamin D and adjust supplement as needed.      Relevant Orders   Vit D  25 hydroxy (rtn osteoporosis monitoring)      I discussed the assessment and treatment plan with the patient. The patient was provided an opportunity to ask questions and all were answered. The patient agreed with the plan and demonstrated an understanding of the instructions.   The patient was advised to call back or seek an in-person evaluation if the symptoms worsen or if the condition fails to improve as anticipated.   I provided 15 minutes of time during this encounter.  Follow up plan: Return in about 4 weeks (around 08/16/2019) for 4 weeks T2DM & COPD and then 3 month T2DM, HTN/HLD, COPD.

## 2019-07-19 NOTE — Assessment & Plan Note (Signed)
Chronic, stable.  Continue current medication regimen and adjust as needed.  Lipid panel and CMP outpatient.

## 2019-07-19 NOTE — Assessment & Plan Note (Signed)
Chronic, ongoing with recent  A1C 6.7% and current episodes of BS <70. Will discontinue Tyler Aas and then continue Trulicity at current dose, along with Metformin.  Continue monitoring BS closely.  She is to notify provider if elevations consistently noted.  Plan on follow-up in 4 weeks and if elevations can consider increasing Trulicity.  Praised her for continued success with diabetes.  Obtain A1C outpatient.

## 2019-07-19 NOTE — Chronic Care Management (AMB) (Signed)
Chronic Care Management   Follow Up Note   07/19/2019 Name: Jordan Chan MRN: [id number] DOB: Sep 13, 1950  Referred by: [name], NP Reason for referral : Chronic Care Management (Medication Management)   Jordan Chan is a 68 y.o. year old female who is a primary care patient of [name], [name], NP. The CCM team was consulted for assistance with chronic disease management and care coordination needs.  Contacted patient for medication management review.     Review of patient status, including review of consultants reports, relevant laboratory and other test results, and collaboration with appropriate care team members and the patient's provider was performed as part of comprehensive patient evaluation and provision of chronic care management services.    SDOH (Social Determinants of Health) screening performed today: None. See Care Plan for related entries.   Outpatient Encounter Medications as of 07/19/2019  Medication Sig Note  . ACCU-CHEK AVIVA PLUS test strip TEST THREE TIMES DAILY   . Accu-Chek Softclix Lancets lancets TEST BLOOD SUGAR ONE TIME DAILY   . aspirin EC 81 MG tablet Take 81 mg by mouth daily.   [name] atorvastatin (LIPITOR) 20 MG tablet Take 1 tablet (20 mg total) by mouth daily.   . diclofenac (VOLTAREN) 50 MG EC tablet TAKE 1 TABLET (50 MG TOTAL) BY MOUTH 3 (THREE) TIMES DAILY.   . Dulaglutide (TRULICITY) 1.5 0000000 SOPN INJECT 1.5MG  (1 PEN) SUBCUTANEOUSLY EVERY WEEK   . Fluticasone-Salmeterol (ADVAIR DISKUS) 250-50 MCG/DOSE AEPB Inhale 1 puff into the lungs 2 (two) times daily.   [name] gabapentin (NEURONTIN) 800 MG tablet Take 1 tablet (800 mg total) by mouth 3 (three) times daily.   . insulin degludec (TRESIBA FLEXTOUCH) 100 UNIT/ML SOPN FlexTouch Pen Inject 0.3 mLs (30 Units total) into the skin at bedtime. 07/19/2019: 12-20 units, sometimes skipping doses  . lisinopril (ZESTRIL) 5 MG tablet TAKE 1 TABLET EVERY DAY   . metFORMIN (GLUCOPHAGE) 1000 MG  tablet TAKE 1 TABLET TWICE DAILY WITH MEALS   . metoprolol succinate (TOPROL-XL) 25 MG 24 hr tablet TAKE 1/2 TABLET EVERY DAY   . pantoprazole (PROTONIX) 40 MG tablet Take 1 tablet (40 mg total) by mouth 2 (two) times daily.   [name] tiotropium (SPIRIVA HANDIHALER) 18 MCG inhalation capsule Place 18 mcg into inhaler and inhale daily.   [name] venlafaxine XR (EFFEXOR-XR) 150 MG 24 hr capsule TAKE 1 CAPSULE EVERY DAY   . albuterol (PROVENTIL HFA;VENTOLIN HFA) 108 (90 BASE) MCG/ACT inhaler Inhale 2 puffs into the lungs every 6 (six) hours as needed for wheezing or shortness of breath.   . cyclobenzaprine (FLEXERIL) 5 MG tablet Take 5 mg by mouth 2 (two) times daily as needed.   . [DISCONTINUED] vitamin B-12 (CYANOCOBALAMIN) 1000 MCG tablet Take 1,000 mcg by mouth daily.    Facility-Administered Encounter Medications as of 07/19/2019  Medication  . cyanocobalamin ((VITAMIN B-12)) injection 1,000 mcg     Goals Addressed            This Visit's Progress     Patient Stated   . "I want to get my blood sugars under control" (pt-stated)       Current Barriers:  . Diabetes: controled, most recent A1c 6.7%  . Current antihyperglycemic regimen: Trulicity 1.5 mg, Tresiba 12-20 units QAM, metformin 1000 mg BID o Notes that she is adjusting her Tyler Aas, sometimes holding her Tyler Aas, if she is having low blood sugar readings.  . Does note that she is having hypoglycemic episodes: 60-70s, both in  the morning and later in the day;  . Current meal patterns: o Continues to avoid sweet tea as much as she can, does not drink any after lunch . Current blood glucose readings: Fasting readings all <130, mostly <100. Does have a few readings >200, but unsure what time of the day these were . Cardiovascular risk reduction: o Current hypertensive regimen: lisinopril 5 mg QAM, metoprolol succinate 12.5 mg daily; BP well controlled at office appointments <140/90 o Current hyperlipidemia regimen: atorvastatin 20 mg daily,  LDL at goal <70;  . COPD; follows w/ [name] at [facility] pulmonary. Currently managed on Advair 250/50 mcg BID and Spiriva 18 mcg daily. Notes that her breathing is "a little bit worse" lately.   Pharmacist Clinical Goal(s):  [name] Over the next 90 days, patient with work with PharmD and primary care provider to address optimized diabetes management  Interventions: . Comprehensive medication management review performed, medication list updated in electronic medical record . Reviewed patient's history of self-adjusting and occasionally skipping [place]. Will collaborate w/ [name], NP about options moving forward. Consider d/c Tresiba, and increase Trulicity to 3 mg weekly if needed to maintain goal A1c <7% . Reviewed [employer] formulary. Trelegy is covered. Consider discussing this w/ [name] at patient's next appt in March.  Patient Self Care Activities:  . Patient will check blood glucose BID, document, and provide at future appointments . Patient will report any questions or concerns to provider   Please see past updates related to this goal by clicking on the "Past Updates" button in the selected goal          Plan: - Will collaborate w/ [name], NP on above recommendations for patient's upcoming PCP appointment - Scheduled f/u with patient in ~6 weeks for continued medication management support (08/30/2019 at 9 am)  Catie Darnelle Maffucci, PharmD, [place] [phone]

## 2019-07-19 NOTE — Assessment & Plan Note (Addendum)
Chronic, stable with BP below goal.  Continue current medication regimen and adjust as needed.  CMP outpatient.

## 2019-07-19 NOTE — Assessment & Plan Note (Signed)
Continue to collaborate with chronic pain clinic in [place].  Has morphine pump.   

## 2019-07-26 ENCOUNTER — Other Ambulatory Visit: Payer: Self-pay

## 2019-07-26 ENCOUNTER — Other Ambulatory Visit: Payer: Medicare HMO

## 2019-07-26 ENCOUNTER — Other Ambulatory Visit: Payer: Self-pay | Admitting: Nurse Practitioner

## 2019-07-26 DIAGNOSIS — E1140 Type 2 diabetes mellitus with diabetic neuropathy, unspecified: Secondary | ICD-10-CM

## 2019-07-26 DIAGNOSIS — E1159 Type 2 diabetes mellitus with other circulatory complications: Secondary | ICD-10-CM

## 2019-07-26 DIAGNOSIS — E785 Hyperlipidemia, unspecified: Secondary | ICD-10-CM

## 2019-07-26 DIAGNOSIS — E559 Vitamin D deficiency, unspecified: Secondary | ICD-10-CM | POA: Diagnosis not present

## 2019-07-26 DIAGNOSIS — Z794 Long term (current) use of insulin: Secondary | ICD-10-CM | POA: Diagnosis not present

## 2019-07-26 DIAGNOSIS — I10 Essential (primary) hypertension: Secondary | ICD-10-CM | POA: Diagnosis not present

## 2019-07-26 DIAGNOSIS — E1169 Type 2 diabetes mellitus with other specified complication: Secondary | ICD-10-CM

## 2019-07-26 DIAGNOSIS — I152 Hypertension secondary to endocrine disorders: Secondary | ICD-10-CM

## 2019-07-26 LAB — LIPID PANEL PICCOLO, WAIVED
Chol/HDL Ratio Piccolo,Waive: 1.8 mg/dL
Cholesterol Piccolo, Waived: 127 mg/dL (ref <200)
HDL Chol Piccolo, Waived: 70 mg/dL (ref >59)
LDL Chol Calc Piccolo Waived: 36 mg/dL (ref <100)
Triglycerides Piccolo,Waived: 104 mg/dL (ref <150)
VLDL Chol Calc Piccolo,Waive: 21 mg/dL (ref <30)

## 2019-07-26 LAB — BAYER DCA HB A1C WAIVED: HB A1C (BAYER DCA - WAIVED): 6.6 % (ref <7.0)

## 2019-07-27 LAB — COMPREHENSIVE METABOLIC PANEL
ALT: 9 IU/L (ref 0–32)
AST: 15 IU/L (ref 0–40)
Albumin/Globulin Ratio: 1.2 (ref 1.2–2.2)
Albumin: 3.8 g/dL (ref 3.8–4.8)
Alkaline Phosphatase: 77 IU/L (ref 39–117)
BUN/Creatinine Ratio: 9 — ABNORMAL LOW (ref 12–28)
BUN: 7 mg/dL — ABNORMAL LOW (ref 8–27)
Bilirubin Total: <0.2 mg/dL (ref 0.0–1.2)
CO2: 27 mmol/L (ref 20–29)
Calcium: 9.1 mg/dL (ref 8.7–10.3)
Chloride: 100 mmol/L (ref 96–106)
Creatinine, Ser: 0.82 mg/dL (ref 0.57–1.00)
GFR calc Af Amer: 84 mL/min/{1.73_m2} (ref >59)
GFR calc non Af Amer: 73 mL/min/{1.73_m2} (ref >59)
Globulin, Total: 3.2 g/dL (ref 1.5–4.5)
Glucose: 167 mg/dL — ABNORMAL HIGH (ref 65–99)
Potassium: 5.2 mmol/L (ref 3.5–5.2)
Sodium: 139 mmol/L (ref 134–144)
Total Protein: 7 g/dL (ref 6.0–8.5)

## 2019-07-27 LAB — VITAMIN D 25 HYDROXY (VIT D DEFICIENCY, FRACTURES): Vit D, 25-Hydroxy: 31.7 ng/mL (ref 30.0–100.0)

## 2019-07-27 NOTE — Progress Notes (Signed)
Please let Jordan Chan know her vitamin D level is much improved, continue daily supplement.  Was 17.5 and now 31.7.  Kidney and liver function remain stable. Have a great day!!  I have sent letter out to her home with her son's labs, no concerns there.  Merry Christmas.

## 2019-08-01 ENCOUNTER — Ambulatory Visit (INDEPENDENT_AMBULATORY_CARE_PROVIDER_SITE_OTHER): Payer: Medicare HMO | Admitting: Family Medicine

## 2019-08-01 ENCOUNTER — Other Ambulatory Visit: Payer: Self-pay

## 2019-08-01 ENCOUNTER — Encounter: Payer: Self-pay | Admitting: Family Medicine

## 2019-08-01 ENCOUNTER — Telehealth: Payer: Self-pay

## 2019-08-01 VITALS — BP 151/76 | HR 98 | Temp 98.8°F | Ht 58.5 in | Wt 143.0 lb

## 2019-08-01 DIAGNOSIS — R3911 Hesitancy of micturition: Secondary | ICD-10-CM

## 2019-08-01 DIAGNOSIS — R29898 Other symptoms and signs involving the musculoskeletal system: Secondary | ICD-10-CM

## 2019-08-01 LAB — UA/M W/RFLX CULTURE, ROUTINE
Bilirubin, UA: NEGATIVE
Glucose, UA: NEGATIVE
Ketones, UA: NEGATIVE
Leukocytes,UA: NEGATIVE
Nitrite, UA: NEGATIVE
Protein,UA: NEGATIVE
RBC, UA: NEGATIVE
Specific Gravity, UA: 1.015 (ref 1.005–1.030)
Urobilinogen, Ur: 0.2 mg/dL (ref 0.2–1.0)
pH, UA: 7 (ref 5.0–7.5)

## 2019-08-01 NOTE — Telephone Encounter (Signed)
Copied from [place] [id number]. Topic: General - Other >> Aug 01, 2019  2:59 PM Celene Kras wrote: Reason for CRM: Pt called and is requesting to have her neurosurgery referral sent to Dr. Brigitte Pulse at [place]. Please advise.

## 2019-08-01 NOTE — Telephone Encounter (Signed)
Checked into referral notes, looks like patient has already been in touch with referral coordinators regarding this and the referral has been sent and patient agreeable to plan

## 2019-08-01 NOTE — Telephone Encounter (Signed)
From today with you.

## 2019-08-01 NOTE — Progress Notes (Signed)
BP (!) 151/76   Pulse 98   Temp 98.8 F (37.1 C) (Oral)   Ht 4' 10.5" (1.486 m)   Wt 143 lb (64.9 kg)   LMP  (LMP Unknown)   SpO2 95%   BMI 29.38 kg/m    Subjective:    Patient ID: [name], female    DOB: Sep 19, 1950, 68 y.o.   MRN: [id number]  HPI: EULONDA BRAILSFORD is a 68 y.o. female  Chief Complaint  Patient presents with  . Knee weakness    left knee. since last Thursday   Patient notes on Christmas eve she went to stand up and couldn't get up using her left leg due to weakness in the knee. States this has never happened to her before. Notes every time this happens to her she has trouble urinating and that it will take her 10-20 min to get urinary stream started. Seems when her strength and function returns to leg she is back able to urinate normally. Has been using walker and cane since and for the most part she's able to ambulate fairly normally. Tearful due to significant fear of falling when these episodes hit her. Feeling back to normal today. Denies back or leg pain, numbness, tingling, injury, syncope, CP, SOB.   Relevant past medical, surgical, family and social history reviewed and updated as indicated. Interim medical history since our last visit reviewed. Allergies and medications reviewed and updated.  Review of Systems  Per HPI unless specifically indicated above     Objective:    BP (!) 151/76   Pulse 98   Temp 98.8 F (37.1 C) (Oral)   Ht 4' 10.5" (1.486 m)   Wt 143 lb (64.9 kg)   LMP  (LMP Unknown)   SpO2 95%   BMI 29.38 kg/m   Wt Readings from Last 3 Encounters:  08/01/19 143 lb (64.9 kg)  06/08/19 142 lb (64.4 kg)  05/19/19 141 lb 1.5 oz (64 kg)    Physical Exam Vitals and nursing note reviewed.  Constitutional:      Appearance: Normal appearance. She is not ill-appearing.  HENT:     Head: Atraumatic.  Eyes:     Extraocular Movements: Extraocular movements intact.     Conjunctiva/sclera: Conjunctivae normal.    Cardiovascular:     Rate and Rhythm: Normal rate and regular rhythm.     Pulses: Normal pulses.     Heart sounds: Normal heart sounds.  Pulmonary:     Effort: Pulmonary effort is normal.     Breath sounds: Normal breath sounds.  Abdominal:     General: Bowel sounds are normal. There is no distension.     Palpations: Abdomen is soft.     Tenderness: There is no abdominal tenderness. There is no right CVA tenderness, left CVA tenderness or guarding.  Musculoskeletal:        General: Normal range of motion.     Cervical back: Normal range of motion and neck supple.     Comments: Gait and strength normal, full and equal b/l today  Skin:    General: Skin is warm and dry.  Neurological:     General: No focal deficit present.     Mental Status: She is alert and oriented to person, place, and time.     Sensory: No sensory deficit.     Motor: No weakness.     Gait: Gait normal.  Psychiatric:        Thought Content: Thought content normal.  Judgment: Judgment normal.     Comments: Tearful      Results for orders placed or performed in visit on 08/01/19  UA/M w/rflx Culture, Routine   Specimen: Urine   URINE  Result Value Ref Range   Specific Gravity, UA 1.015 1.005 - 1.030   pH, UA 7.0 5.0 - 7.5   Color, UA Yellow Yellow   Appearance Ur Clear Clear   Leukocytes,UA Negative Negative   Protein,UA Negative Negative/Trace   Glucose, UA Negative Negative   Ketones, UA Negative Negative   RBC, UA Negative Negative   Bilirubin, UA Negative Negative   Urobilinogen, Ur 0.2 0.2 - 1.0 mg/dL   Nitrite, UA Negative Negative      Assessment & Plan:   Problem List Items Addressed This Visit    None    Visit Diagnoses    Weakness of left lower extremity    -  Primary   Suspect radicular issue from low back, urgent referral placed to Neurosurgery. Use walker for careful ambulation in meantime. ER if worsening   Relevant Orders   Ambulatory referral to Neurosurgery   Urinary  hesitancy       Will r/o UTI and await Neurosurgery eval. Pt aware to go to ER if sxs return prior to specialist consultation   Relevant Orders   UA/M w/rflx Culture, Routine (Completed)   Ambulatory referral to Neurosurgery       Follow up plan: Return if symptoms worsen or fail to improve.

## 2019-08-06 ENCOUNTER — Other Ambulatory Visit: Payer: Self-pay | Admitting: Nurse Practitioner

## 2019-08-09 ENCOUNTER — Ambulatory Visit (INDEPENDENT_AMBULATORY_CARE_PROVIDER_SITE_OTHER): Payer: Medicare HMO

## 2019-08-09 ENCOUNTER — Other Ambulatory Visit: Payer: Self-pay

## 2019-08-09 DIAGNOSIS — E538 Deficiency of other specified B group vitamins: Secondary | ICD-10-CM | POA: Diagnosis not present

## 2019-08-09 MED ORDER — CYANOCOBALAMIN 1000 MCG/ML IJ SOLN
1000.0000 ug | Freq: Once | INTRAMUSCULAR | Status: AC
  Administered 2019-08-09: 1000 ug via INTRAMUSCULAR

## 2019-08-16 DIAGNOSIS — R39198 Other difficulties with micturition: Secondary | ICD-10-CM | POA: Diagnosis not present

## 2019-08-16 DIAGNOSIS — R29898 Other symptoms and signs involving the musculoskeletal system: Secondary | ICD-10-CM | POA: Diagnosis not present

## 2019-08-17 ENCOUNTER — Other Ambulatory Visit: Payer: Self-pay | Admitting: Nurse Practitioner

## 2019-08-17 ENCOUNTER — Other Ambulatory Visit: Payer: Self-pay | Admitting: Student

## 2019-08-17 DIAGNOSIS — R29898 Other symptoms and signs involving the musculoskeletal system: Secondary | ICD-10-CM

## 2019-08-17 DIAGNOSIS — R39198 Other difficulties with micturition: Secondary | ICD-10-CM

## 2019-08-19 ENCOUNTER — Ambulatory Visit (INDEPENDENT_AMBULATORY_CARE_PROVIDER_SITE_OTHER): Payer: Medicare HMO | Admitting: Nurse Practitioner

## 2019-08-19 ENCOUNTER — Other Ambulatory Visit: Payer: Self-pay

## 2019-08-19 ENCOUNTER — Encounter: Payer: Self-pay | Admitting: Nurse Practitioner

## 2019-08-19 VITALS — BP 125/71 | HR 93 | Temp 98.5°F

## 2019-08-19 DIAGNOSIS — J449 Chronic obstructive pulmonary disease, unspecified: Secondary | ICD-10-CM | POA: Diagnosis not present

## 2019-08-19 DIAGNOSIS — R29898 Other symptoms and signs involving the musculoskeletal system: Secondary | ICD-10-CM | POA: Diagnosis not present

## 2019-08-19 NOTE — Assessment & Plan Note (Signed)
Chronic, improved.  Continue Trelegy which offers benefit of medication minimization and has improved symptoms overall.  Continue to collaborate with [name] and review notes.  Return in March for 3 months labs and visit.

## 2019-08-19 NOTE — Progress Notes (Signed)
BP 125/71 (BP Location: Left Arm, Patient Position: Sitting, Cuff Size: Normal)   Pulse 93   Temp 98.5 F (36.9 C) (Oral)   LMP  (LMP Unknown)   SpO2 94%    Subjective:    [name], female    DOB: [birth year], 69 y.o.   MRN: [id number]  HPI: Jordan Chan is a 69 y.o. female  Chief Complaint  Patient presents with  . Follow-up    4 wk   LEFT LEG WEAKNESS Seen initially on 08/01/2019 at clinic for complaint of weakness of left lower extremity accompanied by urinary hesitancy (took 35 minutes to urinate and then was only dribbling).  She did visit with neurosurgery initial visit 08/16/2019 and is scheduled for MRI 08/26/2019.  Her initial symptoms started Christmas Eve when he left leg gave way and she had to use walker.    At this time reports her leg is better, but continues to walk with cane for safety until MRI.  Reports she did have episode of mild leg weakness to left leg last night, but this was not as bad as episode on Christmas Eve.  States that she notices to improve this she holds her leg really stiff and out, which decreases weakness.  Since Christmas Eve has had 4-5 "little spells", but nothing as comparable to Christmas Eve episode.    She is followed by pain clinic in [facility], sees them next in two weeks.  Has had Morphine pump since 2005, initially placed for lower back pain.  Last bone density in 2017 was normal. Duration: weeks Involved leg: left Mechanism of injury: unknown Frequency: intermittent Aggravating factors: unknown  Alleviating factors: stretching left leg out stiff Status: better Treatments attempted: none  Relief with NSAIDs?:  No NSAIDs Taken Weakness with weight bearing or walking: yes Sensation of giving way: yes Locking: no Popping: no Bruising: no Swelling: no Redness: no Paresthesias/decreased sensation: no Fevers: no   COPD Was changed to Trelegy two weeks ago vs being on 3 inhalers daily.  She reports  this is working a lot better.   COPD status: stable Satisfied with current treatment?: yes Oxygen use: no Dyspnea frequency: improved with Trelegy Cough frequency: none Rescue inhaler frequency: hardly ever Limitation of activity: no Productive cough: none Last Spirometry: with [name] Pneumovax: Up to Date Influenza: Up to Date  Relevant past medical, surgical, family and social history reviewed and updated as indicated. Interim medical history since our last visit reviewed. Allergies and medications reviewed and updated.  Review of Systems  Constitutional: Negative for activity change, appetite change, diaphoresis, fatigue and fever.  Respiratory: Negative for cough, chest tightness and shortness of breath.   Cardiovascular: Negative for chest pain, palpitations and leg swelling.  Gastrointestinal: Negative.   Endocrine: Negative for cold intolerance, heat intolerance, polydipsia, polyphagia and polyuria.  Neurological: Positive for weakness (left leg, worse at night). Negative for dizziness, syncope, light-headedness, numbness and headaches.  Psychiatric/Behavioral: Negative.     Per HPI unless specifically indicated above     Objective:    BP 125/71 (BP Location: Left Arm, Patient Position: Sitting, Cuff Size: Normal)   Pulse 93   Temp 98.5 F (36.9 C) (Oral)   LMP  (LMP Unknown)   SpO2 94%   Wt Readings from Last 3 Encounters:  08/01/19 143 lb (64.9 kg)  06/08/19 142 lb (64.4 kg)  05/19/19 141 lb 1.5 oz (64 kg)    Physical Exam Vitals and nursing note reviewed.  Constitutional:      General: She is awake. She is not in acute distress.    Appearance: She is well-developed and overweight. She is not ill-appearing.  HENT:     Head: Normocephalic.     Right Ear: Hearing normal.     Left Ear: Hearing normal.  Eyes:     General: Lids are normal.        Right eye: No discharge.        Left eye: No discharge.     Conjunctiva/sclera: Conjunctivae normal.      Pupils: Pupils are equal, round, and reactive to light.  Neck:     Vascular: No carotid bruit.  Cardiovascular:     Rate and Rhythm: Normal rate and regular rhythm.     Heart sounds: Normal heart sounds. No murmur. No gallop.   Pulmonary:     Effort: Pulmonary effort is normal. No accessory muscle usage or respiratory distress.     Breath sounds: Normal breath sounds.  Abdominal:     General: Bowel sounds are normal.     Palpations: Abdomen is soft.  Musculoskeletal:     Cervical back: Normal range of motion and neck supple.     Right lower leg: No edema.     Left lower leg: No edema.     Right foot: Normal.     Left foot: Normal.  Skin:    General: Skin is warm and dry.  Neurological:     Mental Status: She is alert and oriented to person, place, and time.     Cranial Nerves: Cranial nerves are intact.     Sensory: Sensation is intact.     Motor: Motor function is intact. No weakness.     Coordination: Coordination is intact.     Deep Tendon Reflexes:     Reflex Scores:      Brachioradialis reflexes are 2+ on the right side and 2+ on the left side.      Patellar reflexes are 2+ on the right side and 1+ on the left side.    Comments: Walking with cane at this time.  Psychiatric:        Attention and Perception: Attention normal.        Mood and Affect: Mood normal.        Behavior: Behavior normal. Behavior is cooperative.        Thought Content: Thought content normal.        Judgment: Judgment normal.     Results for orders placed or performed in visit on 08/01/19  UA/M w/rflx Culture, Routine   Specimen: Urine   URINE  Result Value Ref Range   Specific Gravity, UA 1.015 1.005 - 1.030   pH, UA 7.0 5.0 - 7.5   Color, UA Yellow Yellow   Appearance Ur Clear Clear   Leukocytes,UA Negative Negative   Protein,UA Negative Negative/Trace   Glucose, UA Negative Negative   Ketones, UA Negative Negative   RBC, UA Negative Negative   Bilirubin, UA Negative Negative    Urobilinogen, Ur 0.2 0.2 - 1.0 mg/dL   Nitrite, UA Negative Negative      Assessment & Plan:   Problem List Items Addressed This Visit      Respiratory   COPD, moderate ([place]) - Primary    Chronic, improved.  Continue Trelegy which offers benefit of medication minimization and has improved symptoms overall.  Continue to collaborate with [name] and review notes.  Return in March for 3 months  labs and visit.        Nervous and Auditory   Left leg weakness    Acute with some improvement at this time.  Continue collaboration with neurosurgery and review recommendations upon MRI results next week.  Recommend patient continue to utilize cane daily and return to office if worsening.  Discussed urgent symptoms she is immediately to report to ER for.          Follow up plan: Return in about 3 months (around 11/17/2019) for T2DM, HTN/HLD, COPD.

## 2019-08-19 NOTE — Assessment & Plan Note (Signed)
Acute with some improvement at this time.  Continue collaboration with neurosurgery and review recommendations upon MRI results next week.  Recommend patient continue to utilize cane daily and return to office if worsening.  Discussed urgent symptoms she is immediately to report to ER for.

## 2019-08-19 NOTE — Patient Instructions (Signed)

## 2019-08-26 ENCOUNTER — Ambulatory Visit
Admission: RE | Admit: 2019-08-26 | Discharge: 2019-08-26 | Disposition: A | Discharge status: Home / Self Care | Payer: Medicare HMO | Source: Ambulatory Visit | Attending: Student | Admitting: Student

## 2019-08-26 ENCOUNTER — Other Ambulatory Visit: Payer: Self-pay

## 2019-08-26 DIAGNOSIS — R29898 Other symptoms and signs involving the musculoskeletal system: Secondary | ICD-10-CM | POA: Diagnosis present

## 2019-08-26 DIAGNOSIS — M545 Low back pain: Secondary | ICD-10-CM | POA: Diagnosis not present

## 2019-08-26 DIAGNOSIS — R39198 Other difficulties with micturition: Secondary | ICD-10-CM | POA: Insufficient documentation

## 2019-08-29 DIAGNOSIS — G893 Neoplasm related pain (acute) (chronic): Secondary | ICD-10-CM | POA: Diagnosis not present

## 2019-08-29 DIAGNOSIS — G894 Chronic pain syndrome: Secondary | ICD-10-CM | POA: Diagnosis not present

## 2019-08-29 DIAGNOSIS — M5442 Lumbago with sciatica, left side: Secondary | ICD-10-CM | POA: Diagnosis not present

## 2019-08-29 DIAGNOSIS — Z978 Presence of other specified devices: Secondary | ICD-10-CM | POA: Diagnosis not present

## 2019-08-30 ENCOUNTER — Ambulatory Visit (INDEPENDENT_AMBULATORY_CARE_PROVIDER_SITE_OTHER): Payer: Medicare HMO | Admitting: Pharmacist

## 2019-08-30 DIAGNOSIS — Z794 Long term (current) use of insulin: Secondary | ICD-10-CM

## 2019-08-30 DIAGNOSIS — E1140 Type 2 diabetes mellitus with diabetic neuropathy, unspecified: Secondary | ICD-10-CM | POA: Diagnosis not present

## 2019-08-30 NOTE — Chronic Care Management (AMB) (Signed)
Chronic Care Management   Follow Up Note   08/30/2019 Name: Jordan Chan MRN: [id number] DOB: March 21, 1951  Referred by: [name], NP Reason for referral : Chronic Care Management (Medication Management)   Jordan Chan is a 69 y.o. year old female who is a primary care patient of [name], [name], NP. The CCM team was consulted for assistance with chronic disease management and care coordination needs.   Contacted patient for medication management review.   Review of patient status, including review of consultants reports, relevant laboratory and other test results, and collaboration with appropriate care team members and the patient's provider was performed as part of comprehensive patient evaluation and provision of chronic care management services.    SDOH (Social Determinants of Health) screening performed today: Stress. See Care Plan for related entries.   Outpatient Encounter Medications as of 08/30/2019  Medication Sig  . Dulaglutide (TRULICITY) 1.5 0000000 SOPN INJECT 1.5MG  (1 PEN) SUBCUTANEOUSLY EVERY WEEK  . Fluticasone-Umeclidin-Vilant (TRELEGY ELLIPTA) 100-62.5-25 MCG/INH AEPB Inhale 1 puff into the lungs daily.  . TRESIBA FLEXTOUCH 100 UNIT/ML SOPN FlexTouch Pen INJECT 30 UNITS INTO THE SKIN AT BEDTIME.  [name] ACCU-CHEK AVIVA PLUS test strip TEST THREE TIMES DAILY  . Accu-Chek Softclix Lancets lancets TEST BLOOD SUGAR ONE TIME DAILY  . albuterol (PROVENTIL HFA;VENTOLIN HFA) 108 (90 BASE) MCG/ACT inhaler Inhale 2 puffs into the lungs every 6 (six) hours as needed for wheezing or shortness of breath.  [name] aspirin EC 81 MG tablet Take 81 mg by mouth daily.  [name] atorvastatin (LIPITOR) 20 MG tablet Take 1 tablet (20 mg total) by mouth daily.  . cyclobenzaprine (FLEXERIL) 5 MG tablet Take 5 mg by mouth 2 (two) times daily as needed.  . diclofenac (VOLTAREN) 50 MG EC tablet TAKE 1 TABLET (50 MG TOTAL) BY MOUTH 3 (THREE) TIMES DAILY.  [name] gabapentin (NEURONTIN) 800 MG  tablet Take 1 tablet (800 mg total) by mouth 3 (three) times daily.  [name] lisinopril (ZESTRIL) 5 MG tablet TAKE 1 TABLET EVERY DAY  . metFORMIN (GLUCOPHAGE) 1000 MG tablet TAKE 1 TABLET TWICE DAILY WITH MEALS  . metoprolol succinate (TOPROL-XL) 25 MG 24 hr tablet TAKE 1/2 TABLET EVERY DAY  . pantoprazole (PROTONIX) 40 MG tablet Take 1 tablet (40 mg total) by mouth 2 (two) times daily.  [name] venlafaxine XR (EFFEXOR-XR) 150 MG 24 hr capsule Take 1 capsule (150 mg total) by mouth daily.   No facility-administered encounter medications on file as of 08/30/2019.     Objective:   Goals Addressed            This Visit's Progress     Patient Stated   . "I want to get my blood sugars under control" (pt-stated)       Current Barriers:  . Diabetes: controled, most recent A1c 6.6%  . Current antihyperglycemic regimen: Trulicity 1.5 mg, Tresiba 12-20 units QAM, metformin 1000 mg BID o Continues to use [place] "sliding scale" PRN;  . Denies low blood sugars, except if she doesn't eat enough in the afternoon and eats supper late. Notes high readings related to food intake that she corrects w/ Tyler Aas, that then sometimes causes lows . Cardiovascular risk reduction: o Current hypertensive regimen: lisinopril 5 mg QAM, metoprolol succinate 12.5 mg daily; BP well controlled at office appointments <140/90 o Current hyperlipidemia regimen: atorvastatin 20 mg daily, LDL at goal <70;  . COPD; follows w/ [name] at [facility] pulmonary. Currently managed on Trelegy  . Chronic pain:  recent episode of her leg "giving out on her" with possible associated urinary symptoms. Has seen neurosurgery, MRI did not show any concerns. Notes that she spoke w/ neurosurgery and that it could have been related to sitting in a certain position in an old recliner. Pain management recommended a knee brace. Plans to get one today   Pharmacist Clinical Goal(s):  [name] Over the next 90 days, patient with work with PharmD and primary care  provider to address optimized diabetes management  Interventions: . Comprehensive medication management review performed, medication list updated in electronic medical record . Reviewed risk of using basal insulin in sliding scale fashion. Recommend using an alternative medication that better treats post prandial elevations in a glucose dependent manner to allow for d/c of Tresiba. Discussed SGLT2 addition. Patient interested. Will discuss w/ Jolene [name] to see if she would like to start medication now w/ follow up with patient in 4 weeks, or defer changes until next appointment in April   Patient Self Care Activities:  . Patient will check blood glucose BID, document, and provide at future appointments . Patient will report any questions or concerns to provider   Please see past updates related to this goal by clicking on the "Past Updates" button in the selected goal          Plan:  - Scheduled f/u call 10/12/19  Catie Darnelle Maffucci, PharmD, [place] [phone]

## 2019-08-30 NOTE — Patient Instructions (Signed)
Visit Information  Goals Addressed            This Visit's Progress     Patient Stated   . "I want to get my blood sugars under control" (pt-stated)       Current Barriers:  . Diabetes: controled, most recent A1c 6.6%  . Current antihyperglycemic regimen: Trulicity 1.5 mg, Tresiba 12-20 units QAM, metformin 1000 mg BID o Continues to use [place] "sliding scale" PRN;  . Denies low blood sugars, except if she doesn't eat enough in the afternoon and eats supper late. Notes high readings related to food intake that she corrects w/ Tyler Aas, that then sometimes causes lows . Cardiovascular risk reduction: o Current hypertensive regimen: lisinopril 5 mg QAM, metoprolol succinate 12.5 mg daily; BP well controlled at office appointments <140/90 o Current hyperlipidemia regimen: atorvastatin 20 mg daily, LDL at goal <70;  . COPD; follows w/ [name] at [facility] pulmonary. Currently managed on Trelegy  . Chronic pain: recent episode of her leg "giving out on her" with possible associated urinary symptoms. Has seen neurosurgery, MRI did not show any concerns. Notes that she spoke w/ neurosurgery and that it could have been related to sitting in a certain position in an old recliner. Pain management recommended a knee brace. Plans to get one today   Pharmacist Clinical Goal(s):  [name] Over the next 90 days, patient with work with PharmD and primary care provider to address optimized diabetes management  Interventions: . Comprehensive medication management review performed, medication list updated in electronic medical record . Reviewed risk of using basal insulin in sliding scale fashion. Recommend using an alternative medication that better treats post prandial elevations in a glucose dependent manner to allow for d/c of Tresiba. Discussed SGLT2 addition. Patient interested. Will discuss w/ Jolene [name] to see if she would like to start medication now w/ follow up with patient in 4 weeks, or defer changes  until next appointment in April   Patient Self Care Activities:  . Patient will check blood glucose BID, document, and provide at future appointments . Patient will report any questions or concerns to provider   Please see past updates related to this goal by clicking on the "Past Updates" button in the selected goal         The patient verbalized understanding of instructions provided today and declined a print copy of patient instruction materials.    Plan:  - Scheduled f/u call 10/12/19  Catie Darnelle Maffucci, PharmD, [place] [phone]

## 2019-08-31 ENCOUNTER — Other Ambulatory Visit: Payer: Self-pay | Admitting: Nurse Practitioner

## 2019-08-31 ENCOUNTER — Ambulatory Visit: Payer: Self-pay | Admitting: Pharmacist

## 2019-08-31 MED ORDER — JARDIANCE 10 MG PO TABS: 10.0000 mg | ORAL_TABLET | Freq: Every day | ORAL | 3 refills | Status: DC

## 2019-08-31 NOTE — Chronic Care Management (AMB) (Signed)
  Chronic Care Management   Note  08/31/2019 Name: Jordan Chan MRN: [id number] DOB: [birth year]  Jordan Chan is a 69 y.o. year old female who is a primary care patient of [name], [name], NP. The CCM team was consulted for assistance with chronic disease management and care coordination needs.   Attempted to call patient to discuss addition of Jardiance. Left HIPAA compliant message for her to return my call at her convenience. If I do not hear back, will attempt another call in 2-3 business days.   Catie Darnelle Maffucci, PharmD, [place] [phone]

## 2019-09-02 ENCOUNTER — Ambulatory Visit: Payer: Self-pay | Admitting: Pharmacist

## 2019-09-02 DIAGNOSIS — Z794 Long term (current) use of insulin: Secondary | ICD-10-CM

## 2019-09-02 DIAGNOSIS — E1140 Type 2 diabetes mellitus with diabetic neuropathy, unspecified: Secondary | ICD-10-CM

## 2019-09-02 NOTE — Patient Instructions (Signed)
Visit Information  Goals Addressed            This Visit's Progress     Patient Stated   . "I want to get my blood sugars under control" (pt-stated)       Current Barriers:  . Diabetes: controled, most recent A1c 6.6%  . Current antihyperglycemic regimen: Trulicity 1.5 mg, Tresiba 12-20 units QAM, metformin 1000 mg BID, Jardiance 10 mg daily  o Continues to use [place] "sliding scale" PRN;  . Cardiovascular risk reduction: o Current hypertensive regimen: lisinopril 5 mg QAM, metoprolol succinate 12.5 mg daily; BP well controlled at office appointments <140/90 o Current hyperlipidemia regimen: atorvastatin 20 mg daily, LDL at goal <70;  . COPD; follows w/ [name] at [facility] pulmonary. Currently managed on Trelegy  . Chronic pain: recent episode of her leg "giving out on her" with possible associated urinary symptoms. Has seen neurosurgery, MRI did not show any concerns. Notes that she spoke w/ neurosurgery and that it could have been related to sitting in a certain position in an old recliner. Pain management recommended a knee brace. Plans to get one today   Pharmacist Clinical Goal(s):  [name] Over the next 90 days, patient with work with PharmD and primary care provider to address optimized diabetes management  Interventions: . Contacted patient for medication counseling on Jardiance. Advised taking in the morning and staying well hydrated. Advised that she may need less or even no [place]. She verbalized understanding  Patient Self Care Activities:  . Patient will check blood glucose BID, document, and provide at future appointments . Patient will report any questions or concerns to provider   Please see past updates related to this goal by clicking on the "Past Updates" button in the selected goal         The patient verbalized understanding of instructions provided today and declined a print copy of patient instruction materials.    Plan:  - Will collaborate w/ admin team to  schedule patient for a diabetes follow up appointment w/ PCP in~4 weeks  Catie Darnelle Maffucci, PharmD, [place] [phone]

## 2019-09-02 NOTE — Chronic Care Management (AMB) (Signed)
Chronic Care Management   Follow Up Note   09/02/2019 Name: Jordan Chan MRN: [id number] DOB: 04-24-1951  Referred by: [name], NP Reason for referral : Chronic Care Management (Medication Management)   Jordan Chan is a 69 y.o. year old female who is a primary care patient of [name], [name], NP. The CCM team was consulted for assistance with chronic disease management and care coordination needs.   Medication management counseling today.   Review of patient status, including review of consultants reports, relevant laboratory and other test results, and collaboration with appropriate care team members and the patient's provider was performed as part of comprehensive patient evaluation and provision of chronic care management services.    SDOH (Social Determinants of Health) screening performed today: None. See Care Plan for related entries.   Outpatient Encounter Medications as of 09/02/2019  Medication Sig  . ACCU-CHEK AVIVA PLUS test strip TEST THREE TIMES DAILY  . Accu-Chek Softclix Lancets lancets TEST BLOOD SUGAR ONE TIME DAILY  . albuterol (PROVENTIL HFA;VENTOLIN HFA) 108 (90 BASE) MCG/ACT inhaler Inhale 2 puffs into the lungs every 6 (six) hours as needed for wheezing or shortness of breath.  [name] aspirin EC 81 MG tablet Take 81 mg by mouth daily.  [name] atorvastatin (LIPITOR) 20 MG tablet Take 1 tablet (20 mg total) by mouth daily.  . cyclobenzaprine (FLEXERIL) 5 MG tablet Take 5 mg by mouth 2 (two) times daily as needed.  . diclofenac (VOLTAREN) 50 MG EC tablet TAKE 1 TABLET (50 MG TOTAL) BY MOUTH 3 (THREE) TIMES DAILY.  . Dulaglutide (TRULICITY) 1.5 0000000 SOPN INJECT 1.5MG  (1 PEN) SUBCUTANEOUSLY EVERY WEEK  . empagliflozin (JARDIANCE) 10 MG TABS tablet Take 10 mg by mouth daily before breakfast.  . Fluticasone-Umeclidin-Vilant (TRELEGY ELLIPTA) 100-62.5-25 MCG/INH AEPB Inhale 1 puff into the lungs daily.  [name] gabapentin (NEURONTIN) 800 MG tablet Take 1 tablet  (800 mg total) by mouth 3 (three) times daily.  [name] lisinopril (ZESTRIL) 5 MG tablet TAKE 1 TABLET EVERY DAY  . metFORMIN (GLUCOPHAGE) 1000 MG tablet TAKE 1 TABLET TWICE DAILY WITH MEALS  . metoprolol succinate (TOPROL-XL) 25 MG 24 hr tablet TAKE 1/2 TABLET EVERY DAY  . pantoprazole (PROTONIX) 40 MG tablet TAKE 1 TABLET TWICE DAILY  . TRESIBA FLEXTOUCH 100 UNIT/ML SOPN FlexTouch Pen INJECT 30 UNITS INTO THE SKIN AT BEDTIME.  [name] venlafaxine XR (EFFEXOR-XR) 150 MG 24 hr capsule Take 1 capsule (150 mg total) by mouth daily.   No facility-administered encounter medications on file as of 09/02/2019.     Objective:   Goals Addressed            This Visit's Progress     Patient Stated   . "I want to get my blood sugars under control" (pt-stated)       Current Barriers:  . Diabetes: controled, most recent A1c 6.6%  . Current antihyperglycemic regimen: Trulicity 1.5 mg, Tresiba 12-20 units QAM, metformin 1000 mg BID, Jardiance 10 mg daily  o Continues to use [place] "sliding scale" PRN;  . Cardiovascular risk reduction: o Current hypertensive regimen: lisinopril 5 mg QAM, metoprolol succinate 12.5 mg daily; BP well controlled at office appointments <140/90 o Current hyperlipidemia regimen: atorvastatin 20 mg daily, LDL at goal <70;  . COPD; follows w/ [name] at [facility] pulmonary. Currently managed on Trelegy  . Chronic pain: recent episode of her leg "giving out on her" with possible associated urinary symptoms. Has seen neurosurgery, MRI did not show any concerns.  Notes that she spoke w/ neurosurgery and that it could have been related to sitting in a certain position in an old recliner. Pain management recommended a knee brace. Plans to get one today   Pharmacist Clinical Goal(s):  [name] Over the next 90 days, patient with work with PharmD and primary care provider to address optimized diabetes management  Interventions: . Contacted patient for medication counseling on Jardiance. Advised taking  in the morning and staying well hydrated. Advised that she may need less or even no [place]. She verbalized understanding  Patient Self Care Activities:  . Patient will check blood glucose BID, document, and provide at future appointments . Patient will report any questions or concerns to provider   Please see past updates related to this goal by clicking on the "Past Updates" button in the selected goal          Plan:  - Will collaborate w/ admin team to schedule patient for a diabetes follow up appointment w/ PCP in~4 weeks  Catie Darnelle Maffucci, PharmD, [place] [phone]

## 2019-09-06 NOTE — Progress Notes (Signed)
Pt was at store and said she would call us back when she could to make this  4 week appointment.

## 2019-09-08 ENCOUNTER — Other Ambulatory Visit: Payer: Self-pay | Admitting: Nurse Practitioner

## 2019-09-08 NOTE — Telephone Encounter (Signed)
Requested medication (s) are due for refill today:   Requested medication (s) are on the active medication list: no  Last refill: ?  Future visit scheduled: yes  Notes to clinic:  no assigned protocol    Requested Prescriptions  Pending Prescriptions Disp Refills   Alcohol Swabs (B-D SINGLE USE SWABS REGULAR) PADS [Pharmacy Med Name: BD SWABS SINGLE USE   Pad] 200 each     Sig: USE TWICE DAILY  WITH  SUGAR  CHECKS      Off-Protocol Failed - 09/08/2019  1:43 PM      Failed - Medication not assigned to a protocol, review manually.      Passed - Valid encounter within last 12 months    Recent Outpatient Visits           2 weeks ago COPD, moderate ([place])   [place], [place] T, NP   1 month ago Weakness of left lower extremity   [facility] [place], [place]   1 month ago Type 2 diabetes mellitus with diabetic neuropathy, with long-term current use of insulin ([place])   [place], [place] T, NP   4 months ago Type 2 diabetes mellitus with diabetic neuropathy, with long-term current use of insulin ([place])   [place] [name], [place] T, NP   7 months ago Encounter for annual physical exam   [place], [name], NP       Future Appointments             In 2 months [name], [name], NP [facility], [facility]

## 2019-09-08 NOTE — Telephone Encounter (Signed)
Routing to provider  

## 2019-09-09 ENCOUNTER — Other Ambulatory Visit: Payer: Self-pay

## 2019-09-09 ENCOUNTER — Ambulatory Visit (INDEPENDENT_AMBULATORY_CARE_PROVIDER_SITE_OTHER): Payer: Medicare HMO

## 2019-09-09 DIAGNOSIS — E538 Deficiency of other specified B group vitamins: Secondary | ICD-10-CM

## 2019-09-09 MED ORDER — CYANOCOBALAMIN 1000 MCG/ML IJ SOLN
1000.0000 ug | Freq: Once | INTRAMUSCULAR | Status: AC
  Administered 2019-09-09: 1000 ug via INTRAMUSCULAR

## 2019-09-14 ENCOUNTER — Ambulatory Visit (INDEPENDENT_AMBULATORY_CARE_PROVIDER_SITE_OTHER): Payer: Medicare HMO | Admitting: Pharmacist

## 2019-09-14 DIAGNOSIS — E1140 Type 2 diabetes mellitus with diabetic neuropathy, unspecified: Secondary | ICD-10-CM | POA: Diagnosis not present

## 2019-09-14 DIAGNOSIS — Z794 Long term (current) use of insulin: Secondary | ICD-10-CM | POA: Diagnosis not present

## 2019-09-14 NOTE — Chronic Care Management (AMB) (Signed)
Chronic Care Management   Follow Up Note   09/14/2019 Name: Jordan Chan MRN: [id number] DOB: Dec 24, 1950  Referred by: [name], NP Reason for referral : Chronic Care Management (Medication Management)   Jordan Chan is a 69 y.o. year old female who is a primary care patient of [name], [name], NP. The CCM team was consulted for assistance with chronic disease management and care coordination needs.   Received call from patient about a medication issue.   Review of patient status, including review of consultants reports, relevant laboratory and other test results, and collaboration with appropriate care team members and the patient's provider was performed as part of comprehensive patient evaluation and provision of chronic care management services.    SDOH (Social Determinants of Health) screening performed today: None. See Care Plan for related entries.   Outpatient Encounter Medications as of 09/14/2019  Medication Sig  . Alcohol Swabs (B-D SINGLE USE SWABS REGULAR) PADS USE TWICE DAILY  WITH  SUGAR  CHECKS  . empagliflozin (JARDIANCE) 10 MG TABS tablet Take 10 mg by mouth daily before breakfast.  . ACCU-CHEK AVIVA PLUS test strip TEST THREE TIMES DAILY  . Accu-Chek Softclix Lancets lancets TEST BLOOD SUGAR ONE TIME DAILY  . albuterol (PROVENTIL HFA;VENTOLIN HFA) 108 (90 BASE) MCG/ACT inhaler Inhale 2 puffs into the lungs every 6 (six) hours as needed for wheezing or shortness of breath.  [name] aspirin EC 81 MG tablet Take 81 mg by mouth daily.  [name] atorvastatin (LIPITOR) 20 MG tablet Take 1 tablet (20 mg total) by mouth daily.  . cyclobenzaprine (FLEXERIL) 5 MG tablet Take 5 mg by mouth 2 (two) times daily as needed.  . diclofenac (VOLTAREN) 50 MG EC tablet TAKE 1 TABLET (50 MG TOTAL) BY MOUTH 3 (THREE) TIMES DAILY.  . Dulaglutide (TRULICITY) 1.5 0000000 SOPN INJECT 1.5MG  (1 PEN) SUBCUTANEOUSLY EVERY WEEK  . Fluticasone-Umeclidin-Vilant (TRELEGY ELLIPTA)  100-62.5-25 MCG/INH AEPB Inhale 1 puff into the lungs daily.  [name] gabapentin (NEURONTIN) 800 MG tablet Take 1 tablet (800 mg total) by mouth 3 (three) times daily.  [name] lisinopril (ZESTRIL) 5 MG tablet TAKE 1 TABLET EVERY DAY  . metFORMIN (GLUCOPHAGE) 1000 MG tablet TAKE 1 TABLET TWICE DAILY WITH MEALS  . metoprolol succinate (TOPROL-XL) 25 MG 24 hr tablet TAKE 1/2 TABLET EVERY DAY  . pantoprazole (PROTONIX) 40 MG tablet TAKE 1 TABLET TWICE DAILY  . TRESIBA FLEXTOUCH 100 UNIT/ML SOPN FlexTouch Pen INJECT 30 UNITS INTO THE SKIN AT BEDTIME.  [name] venlafaxine XR (EFFEXOR-XR) 150 MG 24 hr capsule Take 1 capsule (150 mg total) by mouth daily.   No facility-administered encounter medications on file as of 09/14/2019.     Objective:   Goals Addressed            This Visit's Progress     Patient Stated   . "I want to get my blood sugars under control" (pt-stated)       Current Barriers:  . Diabetes: controled, most recent A1c 6.6%  o Notes that since starting Jardiance on 09/06/19, she has developed symptoms of a yeast infection (itching), and also is feeling weak, lightheaded, and trouble swallowing. Describes not as a dry mouth, but as when she starts eating, food feels like it's just laying on her throat. Wonders about d/c and going back to insulin . Current antihyperglycemic regimen: Trulicity 1.5 mg, metformin 1000 mg BID, Jardiance 10 mg daily  . Cardiovascular risk reduction: o Current hypertensive regimen: lisinopril 5 mg QAM,  metoprolol succinate 12.5 mg daily; BP well controlled at office appointments <140/90 o Current hyperlipidemia regimen: atorvastatin 20 mg daily, LDL at goal <70;  . COPD; follows w/ [name] at [facility] pulmonary. Currently managed on Trelegy  . Chronic pain: follows w/ pain management  Pharmacist Clinical Goal(s):  [name] Over the next 90 days, patient with work with PharmD and primary care provider to address optimized diabetes management  Interventions: . Collaborated w/  Jolene [name]. Recommended hold Jardiance, and restart Tresiba 18 units daily (patient previously using 20-25 units some days). Patient verbalized understanding. She is to contact the office for a sooner appointment with a provider if her symptoms do not resolve within a few days of Jardiance discontinuation.  Patient Self Care Activities:  . Patient will check blood glucose BID, document, and provide at future appointments . Patient will report any questions or concerns to provider   Please see past updates related to this goal by clicking on the "Past Updates" button in the selected goal          Plan:  - Will outreach patient as previously scheduled  Catie Darnelle Maffucci, PharmD, [place] [phone]

## 2019-09-14 NOTE — Patient Instructions (Signed)
Visit Information  Goals Addressed            This Visit's Progress     Patient Stated   . "I want to get my blood sugars under control" (pt-stated)       Current Barriers:  . Diabetes: controled, most recent A1c 6.6%  o Notes that since starting Jardiance on 09/06/19, she has developed symptoms of a yeast infection (itching), and also is feeling weak, lightheaded, and trouble swallowing. Describes not as a dry mouth, but as when she starts eating, food feels like it's just laying on her throat. Wonders about d/c and going back to insulin . Current antihyperglycemic regimen: Trulicity 1.5 mg, metformin 1000 mg BID, Jardiance 10 mg daily  . Cardiovascular risk reduction: o Current hypertensive regimen: lisinopril 5 mg QAM, metoprolol succinate 12.5 mg daily; BP well controlled at office appointments <140/90 o Current hyperlipidemia regimen: atorvastatin 20 mg daily, LDL at goal <70;  . COPD; follows w/ [name] at [facility] pulmonary. Currently managed on Trelegy  . Chronic pain: follows w/ pain management  Pharmacist Clinical Goal(s):  [name] Over the next 90 days, patient with work with PharmD and primary care provider to address optimized diabetes management  Interventions: . Collaborated w/ Jolene [name]. Recommended hold Jardiance, and restart Tresiba 18 units daily (patient previously using 20-25 units some days). Patient verbalized understanding. She is to contact the office for a sooner appointment with a provider if her symptoms do not resolve within a few days of Jardiance discontinuation.  Patient Self Care Activities:  . Patient will check blood glucose BID, document, and provide at future appointments . Patient will report any questions or concerns to provider   Please see past updates related to this goal by clicking on the "Past Updates" button in the selected goal         The patient verbalized understanding of instructions provided today and declined a print copy of patient  instruction materials.   Plan:  - Will outreach patient as previously scheduled  Catie Darnelle Maffucci, PharmD, [place] [phone]

## 2019-09-20 ENCOUNTER — Ambulatory Visit: Payer: Medicare HMO | Admitting: Nurse Practitioner

## 2019-09-23 ENCOUNTER — Other Ambulatory Visit: Payer: Self-pay | Admitting: Nurse Practitioner

## 2019-09-23 NOTE — Telephone Encounter (Signed)
Requested Prescriptions  Pending Prescriptions Disp Refills  . metFORMIN (GLUCOPHAGE) 1000 MG tablet [Pharmacy Med Name: METFORMIN HYDROCHLORIDE 1000 MG Tablet] 180 tablet 1    Sig: TAKE 1 TABLET TWICE DAILY WITH MEALS     Endocrinology:  Diabetes - Biguanides Passed - 09/23/2019  1:55 PM      Passed - Cr in normal range and within 360 days    Creatinine  Date Value Ref Range Status  09/14/2013 0.68 0.60 - 1.30 mg/dL Final   Creatinine, Ser  Date Value Ref Range Status  07/26/2019 0.82 0.57 - 1.00 mg/dL Final         Passed - HBA1C is between 0 and 7.9 and within 180 days    Hemoglobin A1C  Date Value Ref Range Status  04/28/2016 7.7%  Final   HB A1C (BAYER DCA - WAIVED)  Date Value Ref Range Status  07/26/2019 6.6 <7.0 % Final    Comment:                                          Diabetic Adult            <7.0                                       Healthy Adult        4.3 - 5.7                                                           (DCCT/NGSP) American Diabetes Association's Summary of Glycemic Recommendations for Adults with Diabetes: Hemoglobin A1c <7.0%. More stringent glycemic goals (A1c <6.0%) may further reduce complications at the cost of increased risk of hypoglycemia.          Passed - eGFR in normal range and within 360 days    EGFR (African American)  Date Value Ref Range Status  09/14/2013 >60  Final   GFR calc Af Amer  Date Value Ref Range Status  07/26/2019 84 >59 mL/min/1.73 Final   EGFR (Non-African Amer.)  Date Value Ref Range Status  09/14/2013 >60  Final    Comment:    eGFR values <92m/min/1.73 m2 may be an indication of chronic kidney disease (CKD). Calculated eGFR is useful in patients with stable renal function. The eGFR calculation will not be reliable in acutely ill patients when serum creatinine is changing rapidly. It is not useful in  patients on dialysis. The eGFR calculation may not be applicable to patients at the low and high  extremes of body sizes, pregnant women, and vegetarians.    GFR calc non Af Amer  Date Value Ref Range Status  07/26/2019 73 >59 mL/min/1.73 Final         Passed - Valid encounter within last 6 months    Recent Outpatient Visits          1 month ago COPD, moderate [place]   1 month ago Weakness of left lower extremity   [place]  2 months ago Type 2 diabetes  mellitus with diabetic neuropathy, with long-term current use of insulin ([place])   [place], [place] T, NP   5 months ago Type 2 diabetes mellitus with diabetic neuropathy, with long-term current use of insulin ([place])   [place] [name], [place] T, NP   8 months ago Encounter for annual physical exam   [place], [name], NP      Future Appointments            In 1 week [name], [name], NP [facility], [place]   In 1 month [place], [name], NP [facility], [facility]

## 2019-10-03 ENCOUNTER — Encounter: Payer: Self-pay | Admitting: Nurse Practitioner

## 2019-10-03 ENCOUNTER — Other Ambulatory Visit: Payer: Self-pay

## 2019-10-03 ENCOUNTER — Other Ambulatory Visit: Payer: Self-pay | Admitting: Nurse Practitioner

## 2019-10-03 ENCOUNTER — Ambulatory Visit (INDEPENDENT_AMBULATORY_CARE_PROVIDER_SITE_OTHER): Payer: Medicare HMO | Admitting: Nurse Practitioner

## 2019-10-03 ENCOUNTER — Ambulatory Visit: Payer: Medicare HMO | Admitting: Nurse Practitioner

## 2019-10-03 DIAGNOSIS — Z01818 Encounter for other preprocedural examination: Secondary | ICD-10-CM | POA: Diagnosis not present

## 2019-10-03 DIAGNOSIS — Z794 Long term (current) use of insulin: Secondary | ICD-10-CM | POA: Diagnosis not present

## 2019-10-03 DIAGNOSIS — E1140 Type 2 diabetes mellitus with diabetic neuropathy, unspecified: Secondary | ICD-10-CM | POA: Diagnosis not present

## 2019-10-03 NOTE — Assessment & Plan Note (Signed)
Chronic, ongoing with symptoms improving since stopping Jardiance.  Will continue Trulicity, Tresiba, and Metformin at this time.  She is scheduled for diabetes visit at end of March, will obtain A1C at that time.  Her sugars are remaining in goal ranges with changes.  Return as scheduled.

## 2019-10-03 NOTE — Patient Instructions (Signed)
Carbohydrate Counting for Diabetes Mellitus, Adult  Carbohydrate counting is a method of keeping track of how many carbohydrates you eat. Eating carbohydrates naturally increases the amount of sugar (glucose) in the blood. Counting how many carbohydrates you eat helps keep your blood glucose within normal limits, which helps you manage your diabetes (diabetes mellitus). It is important to know how many carbohydrates you can safely have in each meal. This is different for every person. A diet and nutrition specialist (registered dietitian) can help you make a meal plan and calculate how many carbohydrates you should have at each meal and snack. Carbohydrates are found in the following foods:  Grains, such as breads and cereals.  Dried beans and soy products.  Starchy vegetables, such as potatoes, peas, and corn.  Fruit and fruit juices.  Milk and yogurt.  Sweets and snack foods, such as cake, cookies, candy, chips, and soft drinks. How do I count carbohydrates? There are two ways to count carbohydrates in food. You can use either of the methods or a combination of both. Reading "Nutrition Facts" on packaged food The "Nutrition Facts" list is included on the labels of almost all packaged foods and beverages in the [place] It includes:  The serving size.  Information about nutrients in each serving, including the grams (g) of carbohydrate per serving. To use the "Nutrition Facts":  Decide how many servings you will have.  Multiply the number of servings by the number of carbohydrates per serving.  The resulting number is the total amount of carbohydrates that you will be having. Learning standard serving sizes of other foods When you eat carbohydrate foods that are not packaged or do not include "Nutrition Facts" on the label, you need to measure the servings in order to count the amount of carbohydrates:  Measure the foods that you will eat with a food scale or measuring cup, if  needed.  Decide how many standard-size servings you will eat.  Multiply the number of servings by 15. Most carbohydrate-rich foods have about 15 g of carbohydrates per serving. ? For example, if you eat 8 oz (170 g) of strawberries, you will have eaten 2 servings and 30 g of carbohydrates (2 servings x 15 g = 30 g).  For foods that have more than one food mixed, such as soups and casseroles, you must count the carbohydrates in each food that is included. The following list contains standard serving sizes of common carbohydrate-rich foods. Each of these servings has about 15 g of carbohydrates:   hamburger bun or  English muffin.   oz (15 mL) syrup.   oz (14 g) jelly.  1 slice of bread.  1 six-inch tortilla.  3 oz (85 g) cooked rice or pasta.  4 oz (113 g) cooked dried beans.  4 oz (113 g) starchy vegetable, such as peas, corn, or potatoes.  4 oz (113 g) hot cereal.  4 oz (113 g) mashed potatoes or  of a large baked potato.  4 oz (113 g) canned or frozen fruit.  4 oz (120 mL) fruit juice.  4-6 crackers.  6 chicken nuggets.  6 oz (170 g) unsweetened dry cereal.  6 oz (170 g) plain fat-free yogurt or yogurt sweetened with artificial sweeteners.  8 oz (240 mL) milk.  8 oz (170 g) fresh fruit or one small piece of fruit.  24 oz (680 g) popped popcorn. Example of carbohydrate counting Sample meal  3 oz (85 g) chicken breast.  6 oz (170 g)   brown rice.  4 oz (113 g) corn.  8 oz (240 mL) milk.  8 oz (170 g) strawberries with sugar-free whipped topping. Carbohydrate calculation 1. Identify the foods that contain carbohydrates: ? Rice. ? Corn. ? Milk. ? Strawberries. 2. Calculate how many servings you have of each food: ? 2 servings rice. ? 1 serving corn. ? 1 serving milk. ? 1 serving strawberries. 3. Multiply each number of servings by 15 g: ? 2 servings rice x 15 g = 30 g. ? 1 serving corn x 15 g = 15 g. ? 1 serving milk x 15 g = 15 g. ? 1  serving strawberries x 15 g = 15 g. 4. Add together all of the amounts to find the total grams of carbohydrates eaten: ? 30 g + 15 g + 15 g + 15 g = 75 g of carbohydrates total. Summary  Carbohydrate counting is a method of keeping track of how many carbohydrates you eat.  Eating carbohydrates naturally increases the amount of sugar (glucose) in the blood.  Counting how many carbohydrates you eat helps keep your blood glucose within normal limits, which helps you manage your diabetes.  A diet and nutrition specialist (registered dietitian) can help you make a meal plan and calculate how many carbohydrates you should have at each meal and snack. This information is not intended to replace advice given to you by your health care provider. Make sure you discuss any questions you have with your health care provider. Document Revised: 02/12/2017 Document Reviewed: 01/02/2016 Elsevier Patient Education  2020 [employer]  

## 2019-10-03 NOTE — Progress Notes (Signed)
BP 127/81 (BP Location: Left Arm, Patient Position: Sitting, Cuff Size: Small)   Pulse 86   Temp 98.6 F (37 C) (Oral)   LMP  (LMP Unknown)   SpO2 98%    Subjective:    [name], female    DOB: 1951/04/03, 69 y.o.   MRN: [id number]  HPI: TEARSA SOCK is a 69 y.o. female  Chief Complaint  Patient presents with  . Follow-up   DIABETES Had started Jardiance at previous visit (09/06/2019), which led to dry mouth, yeast infection, and feeling weak.  She had wanted to restart her [place].  This was done on 09/14/2019 -- Jardiance discontinued and restarted Tresiba 18 units.  Continues Trulicity 1.5 MG, Metformin 1000 BID.  Symptoms have improved since stopping Jardiance.   Hypoglycemic episodes:no Polydipsia/polyuria: no Visual disturbance: no Chest pain: no Paresthesias: no Glucose Monitoring: yes  Accucheck frequency: Daily  Fasting glucose: 123 this morning -- have been in 120-130 range on average  Post prandial:  Evening:  Before meals: Taking Insulin?: yes  Long acting insulin: Tresiba 18 units  Short acting insulin: Blood Pressure Monitoring: daily Retinal Examination: Up To Date Foot Exam: Up to Date Pneumovax: Up to Date Influenza: Up to Date Aspirin: yes  Relevant past medical, surgical, family and social history reviewed and updated as indicated. Interim medical history since our last visit reviewed. Allergies and medications reviewed and updated.  Review of Systems  Constitutional: Negative for activity change, appetite change, diaphoresis, fatigue and fever.  Respiratory: Negative for cough, chest tightness and shortness of breath.   Cardiovascular: Negative for chest pain, palpitations and leg swelling.  Gastrointestinal: Negative.   Endocrine: Negative for heat intolerance, polydipsia, polyphagia and polyuria.  Neurological: Negative.   Psychiatric/Behavioral: Negative.     Per HPI unless specifically indicated above       Objective:    BP 127/81 (BP Location: Left Arm, Patient Position: Sitting, Cuff Size: Small)   Pulse 86   Temp 98.6 F (37 C) (Oral)   LMP  (LMP Unknown)   SpO2 98%   Wt Readings from Last 3 Encounters:  08/01/19 143 lb (64.9 kg)  06/08/19 142 lb (64.4 kg)  05/19/19 141 lb 1.5 oz (64 kg)    Physical Exam Vitals and nursing note reviewed.  Constitutional:      General: She is awake. She is not in acute distress.    Appearance: She is well-developed and overweight. She is not ill-appearing.  HENT:     Head: Normocephalic.     Right Ear: Hearing normal.     Left Ear: Hearing normal.  Eyes:     General: Lids are normal.        Right eye: No discharge.        Left eye: No discharge.     Conjunctiva/sclera: Conjunctivae normal.     Pupils: Pupils are equal, round, and reactive to light.  Neck:     Thyroid: No thyromegaly.     Vascular: No carotid bruit.  Cardiovascular:     Rate and Rhythm: Normal rate and regular rhythm.     Heart sounds: Normal heart sounds. No murmur. No gallop.   Pulmonary:     Effort: Pulmonary effort is normal. No accessory muscle usage or respiratory distress.     Breath sounds: Normal breath sounds.  Abdominal:     General: Bowel sounds are normal.     Palpations: Abdomen is soft.  Musculoskeletal:     Cervical  back: Normal range of motion and neck supple.     Right lower leg: No edema.     Left lower leg: No edema.  Lymphadenopathy:     Cervical: No cervical adenopathy.  Skin:    General: Skin is warm and dry.  Neurological:     Mental Status: She is alert and oriented to person, place, and time.  Psychiatric:        Attention and Perception: Attention normal.        Mood and Affect: Mood normal.        Speech: Speech normal.        Behavior: Behavior normal. Behavior is cooperative.    Diabetic Foot Exam - Simple   Simple Foot Form Visual Inspection No deformities, no ulcerations, no other skin breakdown bilaterally: Yes Sensation  Testing Intact to touch and monofilament testing bilaterally: Yes Pulse Check Posterior Tibialis and Dorsalis pulse intact bilaterally: Yes Comments    Results for orders placed or performed in visit on 08/01/19  UA/M w/rflx Culture, Routine   Specimen: Urine   URINE  Result Value Ref Range   Specific Gravity, UA 1.015 1.005 - 1.030   pH, UA 7.0 5.0 - 7.5   Color, UA Yellow Yellow   Appearance Ur Clear Clear   Leukocytes,UA Negative Negative   Protein,UA Negative Negative/Trace   Glucose, UA Negative Negative   Ketones, UA Negative Negative   RBC, UA Negative Negative   Bilirubin, UA Negative Negative   Urobilinogen, Ur 0.2 0.2 - 1.0 mg/dL   Nitrite, UA Negative Negative      Assessment & Plan:   Problem List Items Addressed This Visit      Endocrine   Type 2 diabetes mellitus with diabetic neuropathy, unspecified (HCC)    Chronic, ongoing with symptoms improving since stopping Jardiance.  Will continue Trulicity, Tresiba, and Metformin at this time.  She is scheduled for diabetes visit at end of March, will obtain A1C at that time.  Her sugars are remaining in goal ranges with changes.  Return as scheduled.          Follow up plan: Return for as scheduled at end of March.

## 2019-10-03 NOTE — Telephone Encounter (Signed)
Approved per protocol.  

## 2019-10-05 ENCOUNTER — Ambulatory Visit: Payer: Medicare HMO | Admitting: Nurse Practitioner

## 2019-10-05 DIAGNOSIS — J449 Chronic obstructive pulmonary disease, unspecified: Secondary | ICD-10-CM | POA: Diagnosis not present

## 2019-10-05 DIAGNOSIS — R0600 Dyspnea, unspecified: Secondary | ICD-10-CM | POA: Diagnosis not present

## 2019-10-07 ENCOUNTER — Other Ambulatory Visit: Payer: Self-pay | Admitting: Nurse Practitioner

## 2019-10-07 NOTE — Telephone Encounter (Signed)
Requested Prescriptions  Pending Prescriptions Disp Refills  . TRESIBA FLEXTOUCH 100 UNIT/ML FlexTouch Pen [Pharmacy Med Name: TRESIBA FLEXTOUCH 100 UNIT/ML Solution Pen-injector] 30 mL 0    Sig: INJECT 30 UNITS INTO THE SKIN AT BEDTIME.     Endocrinology:  Diabetes - Insulins Passed - 10/07/2019  2:08 PM      Passed - HBA1C is between 0 and 7.9 and within 180 days    Hemoglobin A1C  Date Value Ref Range Status  04/28/2016 7.7%  Final   HB A1C (BAYER DCA - WAIVED)  Date Value Ref Range Status  07/26/2019 6.6 <7.0 % Final    Comment:                                          Diabetic Adult            <7.0                                       Healthy Adult        4.3 - 5.7                                                           (DCCT/NGSP) American Diabetes Association's Summary of Glycemic Recommendations for Adults with Diabetes: Hemoglobin A1c <7.0%. More stringent glycemic goals (A1c <6.0%) may further reduce complications at the cost of increased risk of hypoglycemia.          Passed - Valid encounter within last 6 months    Recent Outpatient Visits          4 days ago Type 2 diabetes mellitus with diabetic neuropathy, with long-term current use of insulin ([place])   [place], [place]oldsby T, NP   1 month ago COPD, moderate ([place])   [place], [place] T, NP   2 months ago Weakness of left lower extremity   [facility], [place]   2 months ago Type 2 diabetes mellitus with diabetic neuropathy, with long-term current use of insulin ([place])   [place], Jolene T, NP   5 months ago Type 2 diabetes mellitus with diabetic neuropathy, with long-term current use of insulin ([place])   [place], [name], NP      Future Appointments            In 1 month [name], [name], NP [employer], [facility]

## 2019-10-11 ENCOUNTER — Ambulatory Visit (INDEPENDENT_AMBULATORY_CARE_PROVIDER_SITE_OTHER): Payer: Medicare HMO

## 2019-10-11 ENCOUNTER — Other Ambulatory Visit: Payer: Self-pay

## 2019-10-11 DIAGNOSIS — E538 Deficiency of other specified B group vitamins: Secondary | ICD-10-CM | POA: Diagnosis not present

## 2019-10-11 MED ORDER — CYANOCOBALAMIN 1000 MCG/ML IJ SOLN
1000.0000 ug | Freq: Once | INTRAMUSCULAR | Status: AC
  Administered 2019-10-11: 1000 ug via INTRAMUSCULAR

## 2019-10-12 ENCOUNTER — Ambulatory Visit (INDEPENDENT_AMBULATORY_CARE_PROVIDER_SITE_OTHER): Payer: Medicare HMO | Admitting: Pharmacist

## 2019-10-12 DIAGNOSIS — M545 Low back pain, unspecified: Secondary | ICD-10-CM

## 2019-10-12 DIAGNOSIS — E559 Vitamin D deficiency, unspecified: Secondary | ICD-10-CM

## 2019-10-12 DIAGNOSIS — Z794 Long term (current) use of insulin: Secondary | ICD-10-CM

## 2019-10-12 DIAGNOSIS — G8929 Other chronic pain: Secondary | ICD-10-CM

## 2019-10-12 DIAGNOSIS — E1140 Type 2 diabetes mellitus with diabetic neuropathy, unspecified: Secondary | ICD-10-CM

## 2019-10-12 NOTE — Patient Instructions (Signed)
Visit Information  Goals Addressed            This Visit's Progress     Patient Stated   . "I want to get my blood sugars under control" (pt-stated)       CARE PLAN ENTRY (see longtitudinal plan of care for additional care plan information)  Current Barriers:  . Diabetes: controlled, most recent A1c 6.6%  . Current antihyperglycemic regimen: Trulicity 1.5 mg, metformin 1000 mg BID, Tresiba 18-20 units daily usually (notes she will use smaller doses, ~12-14, if sugars low going into bedtime) . Current sugar readings:  o Fastings: 100-120s  o 2 hour post prandial: 180s, occasional 200s . Cardiovascular risk reduction: o Current hypertensive regimen: lisinopril 5 mg QAM, metoprolol succinate 12.5 mg daily; BP well controlled at office appointments <140/90 o Current hyperlipidemia regimen: atorvastatin 20 mg daily, LDL at goal <70;  o Current antiplatelet regimen: ASA 81 mg daily . COPD; Trelegy QAM. Little need for albuterol lately. Follows w/ [name] at [facility] pulmonary. . Chronic pain: follows w/ pain management; gabapentin 800 mg TID, diclofenac 50 mg TID, cyclobenzaprine 5 mg BID PRN  . Mood: venlafaxine XR 150 mg daily   Pharmacist Clinical Goal(s):  [name] Over the next 90 days, patient with work with PharmD and primary care provider to address optimized diabetes management  Interventions: . Comprehensive medication review performed, medication list updated in electronic medical record . Reviewed goal A1c, goal fasting glucose and goal 2 hour post prandial glucose. Praised for continued maintenance of goal sugar readings.  . Praised patient for adherence to daily Tresiba (instead of skipping days that her sugars were "normal")  Patient Self Care Activities:  . Patient will check blood glucose BID, document, and provide at future appointments . Patient will report any questions or concerns to provider   Please see past updates related to this goal by clicking on the "Past  Updates" button in the selected goal         Patient verbalizes understanding of instructions provided today.   Plan:  - Scheduled f/u call 12/19/19  Catie Darnelle Maffucci, PharmD, [place] [phone]

## 2019-10-12 NOTE — Chronic Care Management (AMB) (Signed)
Chronic Care Management   Follow Up Note   10/12/2019 Name: Jordan Chan MRN: [id number] DOB: 09-Oct-1950  Referred by: [name], NP Reason for referral : Chronic Care Management (Medication Management)   Jordan Chan is a 69 y.o. year old female who is a primary care patient of [name], [name], NP. The CCM team was consulted for assistance with chronic disease management and care coordination needs.   Contacted patient for medication management review.  Review of patient status, including review of consultants reports, relevant laboratory and other test results, and collaboration with appropriate care team members and the patient's provider was performed as part of comprehensive patient evaluation and provision of chronic care management services.    SDOH (Social Determinants of Health) assessments performed: Yes See Care Plan activities for detailed interventions related to [facility])     Outpatient Encounter Medications as of 10/12/2019  Medication Sig Note  . ACCU-CHEK AVIVA PLUS test strip TEST THREE TIMES DAILY   . Accu-Chek Softclix Lancets lancets TEST BLOOD SUGAR ONE TIME DAILY   . albuterol (PROVENTIL HFA;VENTOLIN HFA) 108 (90 BASE) MCG/ACT inhaler Inhale 2 puffs into the lungs every 6 (six) hours as needed for wheezing or shortness of breath.   [name] aspirin EC 81 MG tablet Take 81 mg by mouth daily.   [name] atorvastatin (LIPITOR) 20 MG tablet Take 1 tablet (20 mg total) by mouth daily.   . cholecalciferol (VITAMIN D3) 25 MCG (1000 UNIT) tablet Take 1,000 Units by mouth daily.   . cyclobenzaprine (FLEXERIL) 5 MG tablet Take 5 mg by mouth 2 (two) times daily as needed.   . diclofenac (VOLTAREN) 50 MG EC tablet TAKE 1 TABLET (50 MG TOTAL) BY MOUTH 3 (THREE) TIMES DAILY.   . Dulaglutide (TRULICITY) 1.5 0000000 SOPN INJECT 1.5MG  (1 PEN) SUBCUTANEOUSLY EVERY WEEK   . Fluticasone-Umeclidin-Vilant (TRELEGY ELLIPTA) 100-62.5-25 MCG/INH AEPB Inhale 1 puff into the lungs  daily.   [name] gabapentin (NEURONTIN) 800 MG tablet Take 1 tablet (800 mg total) by mouth 3 (three) times daily.   [name] lisinopril (ZESTRIL) 5 MG tablet TAKE 1 TABLET EVERY DAY   . metFORMIN (GLUCOPHAGE) 1000 MG tablet TAKE 1 TABLET TWICE DAILY WITH MEALS   . metoprolol succinate (TOPROL-XL) 25 MG 24 hr tablet TAKE 1/2 TABLET EVERY DAY   . pantoprazole (PROTONIX) 40 MG tablet TAKE 1 TABLET TWICE DAILY   . TRESIBA FLEXTOUCH 100 UNIT/ML FlexTouch Pen INJECT 30 UNITS INTO THE SKIN AT BEDTIME. 10/12/2019: 18-20 units QPM  . venlafaxine XR (EFFEXOR-XR) 150 MG 24 hr capsule Take 1 capsule (150 mg total) by mouth daily.   . [DISCONTINUED] Alcohol Swabs (B-D SINGLE USE SWABS REGULAR) PADS USE TWICE DAILY  WITH  SUGAR  CHECKS    No facility-administered encounter medications on file as of 10/12/2019.     Objective:   Goals Addressed            This Visit's Progress     Patient Stated   . "I want to get my blood sugars under control" (pt-stated)       CARE PLAN ENTRY (see longtitudinal plan of care for additional care plan information)  Current Barriers:  . Diabetes: controlled, most recent A1c 6.6%  . Current antihyperglycemic regimen: Trulicity 1.5 mg, metformin 1000 mg BID, Tresiba 18-20 units daily usually (notes she will use smaller doses, ~12-14, if sugars low going into bedtime) . Current sugar readings:  o Fastings: 100-120s  o 2 hour post prandial: 180s, occasional  200s . Cardiovascular risk reduction: o Current hypertensive regimen: lisinopril 5 mg QAM, metoprolol succinate 12.5 mg daily; BP well controlled at office appointments <140/90 o Current hyperlipidemia regimen: atorvastatin 20 mg daily, LDL at goal <70;  o Current antiplatelet regimen: ASA 81 mg daily . COPD; Trelegy QAM. Little need for albuterol lately. Follows w/ [name] pulmonary. . Chronic pain: follows w/ pain management; gabapentin 800 mg TID, diclofenac 50 mg TID, cyclobenzaprine 5 mg BID PRN  . Mood:  venlafaxine XR 150 mg daily   Pharmacist Clinical Goal(s):  [name] Over the next 90 days, patient with work with PharmD and primary care provider to address optimized diabetes management  Interventions: . Comprehensive medication review performed, medication list updated in electronic medical record . Reviewed goal A1c, goal fasting glucose and goal 2 hour post prandial glucose. Praised for continued maintenance of goal sugar readings.  . Praised patient for adherence to daily Tresiba (instead of skipping days that her sugars were "normal")  Patient Self Care Activities:  . Patient will check blood glucose BID, document, and provide at future appointments . Patient will report any questions or concerns to provider   Please see past updates related to this goal by clicking on the "Past Updates" button in the selected goal          Plan:  - Scheduled f/u call 12/19/19  Catie Darnelle Maffucci, PharmD, [place] [phone]

## 2019-10-14 ENCOUNTER — Other Ambulatory Visit: Payer: Self-pay | Admitting: Nurse Practitioner

## 2019-11-07 DIAGNOSIS — Z978 Presence of other specified devices: Secondary | ICD-10-CM | POA: Diagnosis not present

## 2019-11-07 DIAGNOSIS — G893 Neoplasm related pain (acute) (chronic): Secondary | ICD-10-CM | POA: Diagnosis not present

## 2019-11-07 DIAGNOSIS — G894 Chronic pain syndrome: Secondary | ICD-10-CM | POA: Diagnosis not present

## 2019-11-07 DIAGNOSIS — M5442 Lumbago with sciatica, left side: Secondary | ICD-10-CM | POA: Diagnosis not present

## 2019-11-11 ENCOUNTER — Other Ambulatory Visit: Payer: Self-pay

## 2019-11-11 ENCOUNTER — Ambulatory Visit (INDEPENDENT_AMBULATORY_CARE_PROVIDER_SITE_OTHER): Payer: Medicare HMO

## 2019-11-11 DIAGNOSIS — E538 Deficiency of other specified B group vitamins: Secondary | ICD-10-CM

## 2019-11-11 MED ORDER — CYANOCOBALAMIN 1000 MCG/ML IJ SOLN
1000.0000 ug | Freq: Once | INTRAMUSCULAR | Status: AC
  Administered 2019-11-11: 1000 ug via INTRAMUSCULAR

## 2019-11-14 ENCOUNTER — Ambulatory Visit: Payer: Medicare HMO

## 2019-11-15 ENCOUNTER — Other Ambulatory Visit: Payer: Self-pay

## 2019-11-15 ENCOUNTER — Ambulatory Visit (INDEPENDENT_AMBULATORY_CARE_PROVIDER_SITE_OTHER): Payer: Medicare HMO | Admitting: Nurse Practitioner

## 2019-11-15 ENCOUNTER — Encounter: Payer: Self-pay | Admitting: Nurse Practitioner

## 2019-11-15 VITALS — BP 135/74 | HR 86 | Temp 98.2°F | Wt 145.0 lb

## 2019-11-15 DIAGNOSIS — M545 Low back pain, unspecified: Secondary | ICD-10-CM

## 2019-11-15 DIAGNOSIS — E1140 Type 2 diabetes mellitus with diabetic neuropathy, unspecified: Secondary | ICD-10-CM

## 2019-11-15 DIAGNOSIS — Z6830 Body mass index (BMI) 30.0-30.9, adult: Secondary | ICD-10-CM

## 2019-11-15 DIAGNOSIS — Z794 Long term (current) use of insulin: Secondary | ICD-10-CM | POA: Diagnosis not present

## 2019-11-15 DIAGNOSIS — I10 Essential (primary) hypertension: Secondary | ICD-10-CM | POA: Diagnosis not present

## 2019-11-15 DIAGNOSIS — E1159 Type 2 diabetes mellitus with other circulatory complications: Secondary | ICD-10-CM

## 2019-11-15 DIAGNOSIS — E785 Hyperlipidemia, unspecified: Secondary | ICD-10-CM

## 2019-11-15 DIAGNOSIS — G4733 Obstructive sleep apnea (adult) (pediatric): Secondary | ICD-10-CM

## 2019-11-15 DIAGNOSIS — E6609 Other obesity due to excess calories: Secondary | ICD-10-CM | POA: Diagnosis not present

## 2019-11-15 DIAGNOSIS — F324 Major depressive disorder, single episode, in partial remission: Secondary | ICD-10-CM

## 2019-11-15 DIAGNOSIS — G8929 Other chronic pain: Secondary | ICD-10-CM

## 2019-11-15 DIAGNOSIS — I152 Hypertension secondary to endocrine disorders: Secondary | ICD-10-CM

## 2019-11-15 DIAGNOSIS — J449 Chronic obstructive pulmonary disease, unspecified: Secondary | ICD-10-CM | POA: Diagnosis not present

## 2019-11-15 DIAGNOSIS — E538 Deficiency of other specified B group vitamins: Secondary | ICD-10-CM

## 2019-11-15 DIAGNOSIS — E1169 Type 2 diabetes mellitus with other specified complication: Secondary | ICD-10-CM

## 2019-11-15 DIAGNOSIS — E66811 Obesity, class 1: Secondary | ICD-10-CM

## 2019-11-15 DIAGNOSIS — R682 Dry mouth, unspecified: Secondary | ICD-10-CM

## 2019-11-15 LAB — MICROALBUMIN, URINE WAIVED
Creatinine, Urine Waived: 50 mg/dL (ref 10–300)
Microalb, Ur Waived: 10 mg/L (ref 0–19)

## 2019-11-15 LAB — BAYER DCA HB A1C WAIVED: HB A1C (BAYER DCA - WAIVED): 7 % — ABNORMAL HIGH (ref <7.0)

## 2019-11-15 MED ORDER — FLUCONAZOLE 150 MG PO TABS: 150.0000 mg | ORAL_TABLET | Freq: Once | ORAL | 0 refills | Status: AC

## 2019-11-15 NOTE — Assessment & Plan Note (Signed)
Continue to collaborate with chronic pain clinic in [place].  Has morphine pump.   

## 2019-11-15 NOTE — Assessment & Plan Note (Signed)
Check B12 level today and continue injections. 

## 2019-11-15 NOTE — Assessment & Plan Note (Signed)
Chronic, improved.  Continue Trelegy which offers benefit of medication minimization and has improved symptoms overall.  Continue to collaborate with [name] and review notes.

## 2019-11-15 NOTE — Progress Notes (Signed)
BP 135/74   Pulse 86   Temp 98.2 F (36.8 C) (Oral)   Wt 145 lb (65.8 kg)   LMP  (LMP Unknown)   SpO2 91%   BMI 29.79 kg/m    Subjective:    [name], female    DOB: 1950/09/22, 69 y.o.   MRN: [id number]  HPI: Jordan Chan is a 69 y.o. female  Chief Complaint  Patient presents with  . Diabetes  . Hypertension  . Hyperlipidemia  . COPD   DIABETES On Trulicity, taking 1.5 MG weekly.  Continues on Metformin 1000 MG BID and Tresiba 20 units at HS.  Working with CCM team and provider. Recent A1C in December 6.6%.  She also continues on monthly B12 shots for low B12 levels. Continues to have sore mouth after trying Jardiance, would like to try a dose Diflucan to see if helps. Hypoglycemic episodes: none Polydipsia/polyuria: no Visual disturbance: no Chest pain: no Paresthesias: no Glucose Monitoring: yes  Accucheck frequency: BID  Fasting glucose: 100 to 118  Post prandial: after lunch 160 to 260, occasional is >200  Evening:   Before meals: Taking Insulin?: yes  Long acting insulin: Tresiba 20 units daily  Short acting insulin: Blood Pressure Monitoring: just got cuff yesterday Retinal Examination: Up to Date Foot Exam: Up to Date Pneumovax: Up to Date Influenza: Up to Date Aspirin: yes   HYPERTENSION / HYPERLIPIDEMIA Continues on Atorvastatin 20 MG, Lisinopril 5 MG, Metoprolol 12.5 MG daily + ASA. Recent labs with LDL 36 and TCHOL 127. Satisfied with current treatment? yes Duration of hypertension: chronic BP monitoring frequency: a few times a week BP range:  120-130/60-70 BP medication side effects: no Duration of hyperlipidemia: chronic Cholesterol medication side effects: no Cholesterol supplements: none Medication compliance: good compliance Aspirin: yes Recent stressors: no Recurrent headaches: no Visual changes: no Palpitations: no Dyspnea: no Chest pain: no Lower extremity edema: no Dizzy/lightheaded: no    COPD Currently using Trelegy, was changed to this recently.  With this change has had to very seldom have to use Albuterol Has a history of smoking, quit 15 years ago.  Followed by [name] with pulmonary.  She does not use CPAP, has tried and it caused anxiety, uses O2 at home 2.5 L [place]. COPD status: slightly uncontrolled at this time Satisfied with current treatment?: yes Oxygen use: no Dyspnea frequency: none Cough frequency: none Rescue inhaler frequency:  minimal Limitation of activity: no Productive cough: none Last Spirometry: unknown Pneumovax: Up to Date Influenza: Up to Date  CHRONIC PAIN: Followed by [facility] in [place], last seen 11/07/2019.  Continues to be followed by clinic nurse for pump check and replacement. She states her pain is well-controlled with current regimen.  Vitamin D on low side (17.5) in June, taking supplement with recent level improved at 31.7.  No recent falls or fractures.  Normal DEXA 2017.  DEPRESSION Continues on Effexor 150 MG daily. Mood status: controlled Satisfied with current treatment?: yes Symptom severity: mild  Duration of current treatment : chronic Side effects: no Medication compliance: good compliance Psychotherapy/counseling: has gone in the past Depressed mood: yes Anxious mood: no Anhedonia: no Significant weight loss or gain: no Insomnia: none Fatigue: no Feelings of worthlessness or guilt: no Impaired concentration/indecisiveness: no Suicidal ideations: no Hopelessness: no Crying spells: no Depression screen [place] 2/9 11/15/2019 08/01/2019 07/19/2019 01/17/2019 01/17/2019  Decreased Interest 0 0 0 0 0  Down, Depressed, Hopeless 3 1 0 0 0  PHQ - 2 Score 3 1 0 0 0  Altered sleeping 3 3 3 3 3   Tired, decreased energy 2 3 3 2 2   Change in appetite 0 0 0 0 0  Feeling bad or failure about yourself  0 0 0 0 0  Trouble concentrating 1 0 0 0 0  Moving slowly or fidgety/restless 0 0 0 0 0  Suicidal thoughts 0 0 0 0  0  PHQ-9 Score 9 7 6 5 5   Difficult doing work/chores Somewhat difficult - Not difficult at all - Not difficult at all  Some recent data might be hidden    Relevant past medical, surgical, family and social history reviewed and updated as indicated. Interim medical history since our last visit reviewed. Allergies and medications reviewed and updated.  Review of Systems  Constitutional: Negative for activity change, appetite change, diaphoresis, fatigue and fever.  Respiratory: Negative for cough, chest tightness and shortness of breath.   Cardiovascular: Negative for chest pain, palpitations and leg swelling.  Gastrointestinal: Negative.   Endocrine: Negative for heat intolerance, polydipsia, polyphagia and polyuria.  Neurological: Negative.   Psychiatric/Behavioral: Negative.     Per HPI unless specifically indicated above     Objective:    BP 135/74   Pulse 86   Temp 98.2 F (36.8 C) (Oral)   Wt 145 lb (65.8 kg)   LMP  (LMP Unknown)   SpO2 91%   BMI 29.79 kg/m   Wt Readings from Last 3 Encounters:  11/15/19 145 lb (65.8 kg)  08/01/19 143 lb (64.9 kg)  06/08/19 142 lb (64.4 kg)    Physical Exam Vitals and nursing note reviewed.  Constitutional:      General: She is awake. She is not in acute distress.    Appearance: She is well-developed and well-groomed. She is not ill-appearing.  HENT:     Head: Normocephalic.     Right Ear: Hearing normal.     Left Ear: Hearing normal.     Mouth/Throat:     Mouth: Mucous membranes are dry.     Comments: Mild erythema to corners of mouth with skin intact. Eyes:     General: Lids are normal.        Right eye: No discharge.        Left eye: No discharge.     Conjunctiva/sclera: Conjunctivae normal.     Pupils: Pupils are equal, round, and reactive to light.  Neck:     Thyroid: No thyromegaly.     Vascular: No carotid bruit.  Cardiovascular:     Rate and Rhythm: Normal rate and regular rhythm.     Heart sounds: Normal  heart sounds. No murmur. No gallop.   Pulmonary:     Effort: Pulmonary effort is normal. No accessory muscle usage or respiratory distress.     Breath sounds: Normal breath sounds.  Abdominal:     General: Bowel sounds are normal.     Palpations: Abdomen is soft.  Musculoskeletal:     Cervical back: Normal range of motion and neck supple.     Right lower leg: No edema.     Left lower leg: No edema.  Lymphadenopathy:     Cervical: No cervical adenopathy.  Skin:    General: Skin is warm and dry.  Neurological:     Mental Status: She is alert and oriented to person, place, and time.  Psychiatric:        Attention and Perception: Attention normal.  Mood and Affect: Mood normal.        Speech: Speech normal.        Behavior: Behavior normal. Behavior is cooperative.        Thought Content: Thought content normal.     Results for orders placed or performed in visit on 08/01/19  UA/M w/rflx Culture, Routine   Specimen: Urine   URINE  Result Value Ref Range   Specific Gravity, UA 1.015 1.005 - 1.030   pH, UA 7.0 5.0 - 7.5   Color, UA Yellow Yellow   Appearance Ur Clear Clear   Leukocytes,UA Negative Negative   Protein,UA Negative Negative/Trace   Glucose, UA Negative Negative   Ketones, UA Negative Negative   RBC, UA Negative Negative   Bilirubin, UA Negative Negative   Urobilinogen, Ur 0.2 0.2 - 1.0 mg/dL   Nitrite, UA Negative Negative      Assessment & Plan:   Problem List Items Addressed This Visit      Cardiovascular and Mediastinum   Hypertension associated with diabetes ([place])    Chronic, stable with BP below goal on visit today and on home readings.  Recommend she continue to monitor BP at home regularly.  Continue current medication regimen and adjust as needed.  BMP outpatient.       Relevant Orders   Bayer DCA Hb A1c Waived   Basic metabolic panel     Respiratory   Sleep apnea    Poor tolerance of CPAP mask, uses O2 night 2.5 L [place].      COPD,  moderate (HCC)    Chronic, improved.  Continue Trelegy which offers benefit of medication minimization and has improved symptoms overall.  Continue to collaborate with [name] and review notes.          Endocrine   Hyperlipidemia associated with type 2 diabetes mellitus (HCC)    Chronic, stable.  Continue current medication regimen and adjust as needed.  Lipid panel today.      Relevant Orders   Bayer DCA Hb A1c Waived   Lipid Panel w/o Chol/HDL Ratio   Type 2 diabetes mellitus with diabetic neuropathy, unspecified (HCC) - Primary    Chronic, ongoing. A1C today 7%, slightly elevated from previous.  Will continue Trulicity, Tresiba, and Metformin at this time.  Recommend she continue to monitor BS consistently at home and document + focus heavily on diet changes.  Could consider increase in Trulicity next visit if elevation noted.  Return in 3 months.       Relevant Orders   Bayer DCA Hb A1c Waived   Microalbumin, Urine Waived     Other   Obesity    Recommended eating smaller high protein, low fat meals more frequently and exercising 30 mins a day 5 times a week with a goal of 10-15lb weight loss in the next 3 months. Patient voiced their understanding and motivation to adhere to these recommendations.       Depression, major, single episode, in partial remission (HCC)    Chronic, stable.  Denies SI/HI.  Mood well-controlled.  Continue current medication regimen and adjust as needed.  Refills as needed.      Chronic back pain    Continue to collaborate with chronic pain clinic in [facility].  Has morphine pump.        B12 deficiency    Check B12 level today and continue injections.      Relevant Orders   Vitamin B12    Other Visit Diagnoses  Dry mouth       Check B12 level today and will send in dose Diflucan for ongoing discomfort after Jardiance.       Follow up plan: Return in about 3 months (around 02/14/2020) for T2DM, HTN/HLD, COPD, MOOD.

## 2019-11-15 NOTE — Assessment & Plan Note (Signed)
Chronic, stable.  Continue current medication regimen and adjust as needed.  Lipid panel today. 

## 2019-11-15 NOTE — Assessment & Plan Note (Signed)
Chronic, ongoing. A1C today 7%, slightly elevated from previous.  Will continue Trulicity, Tresiba, and Metformin at this time.  Recommend she continue to monitor BS consistently at home and document + focus heavily on diet changes.  Could consider increase in Trulicity next visit if elevation noted.  Return in 3 months.

## 2019-11-15 NOTE — Assessment & Plan Note (Signed)
Chronic, stable with BP below goal on visit today and on home readings.  Recommend she continue to monitor BP at home regularly.  Continue current medication regimen and adjust as needed.  BMP outpatient.

## 2019-11-15 NOTE — Assessment & Plan Note (Signed)
Recommended eating smaller high protein, low fat meals more frequently and exercising 30 mins a day 5 times a week with a goal of 10-15lb weight loss in the next 3 months. Patient voiced their understanding and motivation to adhere to these recommendations.  

## 2019-11-15 NOTE — Patient Instructions (Signed)
Carbohydrate Counting for Diabetes Mellitus, Adult  Carbohydrate counting is a method of keeping track of how many carbohydrates you eat. Eating carbohydrates naturally increases the amount of sugar (glucose) in the blood. Counting how many carbohydrates you eat helps keep your blood glucose within normal limits, which helps you manage your diabetes (diabetes mellitus). It is important to know how many carbohydrates you can safely have in each meal. This is different for every person. A diet and nutrition specialist (registered dietitian) can help you make a meal plan and calculate how many carbohydrates you should have at each meal and snack. Carbohydrates are found in the following foods:  Grains, such as breads and cereals.  Dried beans and soy products.  Starchy vegetables, such as potatoes, peas, and corn.  Fruit and fruit juices.  Milk and yogurt.  Sweets and snack foods, such as cake, cookies, candy, chips, and soft drinks. How do I count carbohydrates? There are two ways to count carbohydrates in food. You can use either of the methods or a combination of both. Reading "Nutrition Facts" on packaged food The "Nutrition Facts" list is included on the labels of almost all packaged foods and beverages in the [place] It includes:  The serving size.  Information about nutrients in each serving, including the grams (g) of carbohydrate per serving. To use the "Nutrition Facts":  Decide how many servings you will have.  Multiply the number of servings by the number of carbohydrates per serving.  The resulting number is the total amount of carbohydrates that you will be having. Learning standard serving sizes of other foods When you eat carbohydrate foods that are not packaged or do not include "Nutrition Facts" on the label, you need to measure the servings in order to count the amount of carbohydrates:  Measure the foods that you will eat with a food scale or measuring cup, if  needed.  Decide how many standard-size servings you will eat.  Multiply the number of servings by 15. Most carbohydrate-rich foods have about 15 g of carbohydrates per serving. ? For example, if you eat 8 oz (170 g) of strawberries, you will have eaten 2 servings and 30 g of carbohydrates (2 servings x 15 g = 30 g).  For foods that have more than one food mixed, such as soups and casseroles, you must count the carbohydrates in each food that is included. The following list contains standard serving sizes of common carbohydrate-rich foods. Each of these servings has about 15 g of carbohydrates:   hamburger bun or  English muffin.   oz (15 mL) syrup.   oz (14 g) jelly.  1 slice of bread.  1 six-inch tortilla.  3 oz (85 g) cooked rice or pasta.  4 oz (113 g) cooked dried beans.  4 oz (113 g) starchy vegetable, such as peas, corn, or potatoes.  4 oz (113 g) hot cereal.  4 oz (113 g) mashed potatoes or  of a large baked potato.  4 oz (113 g) canned or frozen fruit.  4 oz (120 mL) fruit juice.  4-6 crackers.  6 chicken nuggets.  6 oz (170 g) unsweetened dry cereal.  6 oz (170 g) plain fat-free yogurt or yogurt sweetened with artificial sweeteners.  8 oz (240 mL) milk.  8 oz (170 g) fresh fruit or one small piece of fruit.  24 oz (680 g) popped popcorn. Example of carbohydrate counting Sample meal  3 oz (85 g) chicken breast.  6 oz (170 g)   brown rice.  4 oz (113 g) corn.  8 oz (240 mL) milk.  8 oz (170 g) strawberries with sugar-free whipped topping. Carbohydrate calculation 1. Identify the foods that contain carbohydrates: ? Rice. ? Corn. ? Milk. ? Strawberries. 2. Calculate how many servings you have of each food: ? 2 servings rice. ? 1 serving corn. ? 1 serving milk. ? 1 serving strawberries. 3. Multiply each number of servings by 15 g: ? 2 servings rice x 15 g = 30 g. ? 1 serving corn x 15 g = 15 g. ? 1 serving milk x 15 g = 15 g. ? 1  serving strawberries x 15 g = 15 g. 4. Add together all of the amounts to find the total grams of carbohydrates eaten: ? 30 g + 15 g + 15 g + 15 g = 75 g of carbohydrates total. Summary  Carbohydrate counting is a method of keeping track of how many carbohydrates you eat.  Eating carbohydrates naturally increases the amount of sugar (glucose) in the blood.  Counting how many carbohydrates you eat helps keep your blood glucose within normal limits, which helps you manage your diabetes.  A diet and nutrition specialist (registered dietitian) can help you make a meal plan and calculate how many carbohydrates you should have at each meal and snack. This information is not intended to replace advice given to you by your health care provider. Make sure you discuss any questions you have with your health care provider. Document Revised: 02/12/2017 Document Reviewed: 01/02/2016 Elsevier Patient Education  2020 [employer]  

## 2019-11-15 NOTE — Assessment & Plan Note (Signed)
Poor tolerance of CPAP mask, uses O2 night 2.5 L [place].

## 2019-11-15 NOTE — Assessment & Plan Note (Signed)
Chronic, stable.  Denies SI/HI.  Mood well-controlled.  Continue current medication regimen and adjust as needed.  Refills as needed.

## 2019-11-16 LAB — LIPID PANEL W/O CHOL/HDL RATIO
Cholesterol, Total: 128 mg/dL (ref 100–199)
HDL: 69 mg/dL (ref >39)
LDL Chol Calc (NIH): 45 mg/dL (ref 0–99)
Triglycerides: 65 mg/dL (ref 0–149)
VLDL Cholesterol Cal: 14 mg/dL (ref 5–40)

## 2019-11-16 LAB — BASIC METABOLIC PANEL
BUN/Creatinine Ratio: 12 (ref 12–28)
BUN: 9 mg/dL (ref 8–27)
CO2: 26 mmol/L (ref 20–29)
Calcium: 9.3 mg/dL (ref 8.7–10.3)
Chloride: 103 mmol/L (ref 96–106)
Creatinine, Ser: 0.75 mg/dL (ref 0.57–1.00)
GFR calc Af Amer: 94 mL/min/{1.73_m2} (ref >59)
GFR calc non Af Amer: 82 mL/min/{1.73_m2} (ref >59)
Glucose: 106 mg/dL — ABNORMAL HIGH (ref 65–99)
Potassium: 5 mmol/L (ref 3.5–5.2)
Sodium: 142 mmol/L (ref 134–144)

## 2019-11-16 LAB — VITAMIN B12: Vitamin B-12: 988 pg/mL (ref 232–1245)

## 2019-11-16 NOTE — Progress Notes (Signed)
Please let Avalei know she is awesome and I love her!!  Her labs have returned and everything looks great, including cholesterol levels at goal, B12 level good, and kidney function is good.  Have a great day!!

## 2019-11-17 ENCOUNTER — Ambulatory Visit (INDEPENDENT_AMBULATORY_CARE_PROVIDER_SITE_OTHER): Payer: Medicare HMO

## 2019-11-17 VITALS — Ht 58.5 in | Wt 145.0 lb

## 2019-11-17 DIAGNOSIS — Z0000 Encounter for general adult medical examination without abnormal findings: Secondary | ICD-10-CM | POA: Diagnosis not present

## 2019-11-17 NOTE — Progress Notes (Signed)
Subjective:   SMITH ARMS is a 69 y.o. female who presents for Medicare Annual (Subsequent) preventive examination.  This visit is being conducted via phone call  - after an attmept to do on video chat - due to the COVID-19 pandemic. This patient has given me verbal consent via phone to conduct this visit, patient states they are participating from their home address. Some vital signs may be absent or patient reported.   Patient identification: identified by name, DOB, and current address.    Review of Systems:   Cardiac Risk Factors include: advanced age (>20men, >44 women);dyslipidemia;hypertension     Objective:     Vitals: Ht 4' 10.5" (1.486 m)   Wt 145 lb (65.8 kg)   LMP  (LMP Unknown)   BMI 29.79 kg/m   Body mass index is 29.79 kg/m.  Advanced Directives 11/17/2019 12/04/2017 06/01/2017 04/28/2017 04/25/2017 04/25/2017 03/02/2017  Does Patient Have a Medical Advance Directive? Yes Yes Yes - Yes No;Yes No  Type of Advance Directive Living will;Healthcare Power of [place];Living will - Living will Living will [place];Living will -  Does patient want to make changes to medical advance directive? - - - No - Patient declined No - Patient declined - -  Copy of [place] in Chart? No - copy requested No - copy requested - - - - -  Would patient like information on creating a medical advance directive? - - - - - - No - Patient declined    Tobacco Social History   Tobacco Use  Smoking Status Former Smoker  . Packs/day: 1.00  . Years: 30.00  . Pack years: 30.00  . Types: Cigarettes  . Quit date: 04/30/2004  . Years since quitting: 15.5  Smokeless Tobacco Never Used     Counseling given: Not Answered   Clinical Intake:  Pre-visit preparation completed: Yes        Nutritional Status: BMI 25 -29 Overweight Nutritional Risks: None  How often do you need to have someone help you when you read  instructions, pamphlets, or other written materials from your doctor or pharmacy?: 1 - Never  Interpreter Needed?: No  Information entered by :: Asuncion Shibata,LPN  Past Medical History:  Diagnosis Date  . Arthritis   . Asthma   . Breast cancer ([place]) 1998   right breast ca with mastectomy and chemotherapy and radiation  . Bronchitis   . CHF (congestive heart failure) ([place])    "with Morphine withdrawal"  . COPD (chronic obstructive pulmonary disease) ([place])   . Diabetes mellitus without complication ([place])   . Diverticulitis    diverticulosis also  . Dyspnea   . Endometriosis   . GERD (gastroesophageal reflux disease)   . History of shingles 2000-2005  . Hypercholesteremia   . Hypertension   . IBS (irritable bowel syndrome)   . Low back pain    a. Implanted morphine/bupivicaine/clonidine pump.  [name] Neuropathy   . Orthopnea   . Oxygen dependent    uses at night  . Personal history of chemotherapy   . Personal history of radiation therapy   . Pneumonia    pneumonia 5-6 times, history of bronchitis also  . Scoliosis   . Sleep apnea    does not use cpap  . Stroke ([place]) 2010   TIA, 10 years ago  . Withdrawal from sedative drug ([place])    withdrawal from morphine when pump batteries died   Past Surgical History:  Procedure Laterality Date  . ABDOMINAL HYSTERECTOMY  1987  . BACK SURGERY     Tailbone removed following fracture  . BREAST SURGERY Right    mastectomy  . CARDIAC CATHETERIZATION    . CATARACT EXTRACTION W/PHACO Right 05/19/2019   Procedure: CATARACT EXTRACTION PHACO AND INTRAOCULAR LENS PLACEMENT ([place]), RIGHT, DIABETIC;  Surgeon: Marchia Meiers, MD;  Location: [facility] ORS;  Service: Ophthalmology;  Laterality: Right;  Lot # [id number] H [place]: 00:43.8 CDE: 4.59  . CATARACT EXTRACTION W/PHACO Left 06/16/2019   Procedure: CATARACT EXTRACTION PHACO AND INTRAOCULAR LENS PLACEMENT ([place]) LEFT VISION BLUE DIABETIC;  Surgeon: Marchia Meiers, MD;  Location: [facility] ORS;  Service:  Ophthalmology;  Laterality: Left;  Lot [id number] H [place]: 00:46.9 CDE: 6.53  . COCCYX REMOVAL    . COLONOSCOPY WITH PROPOFOL N/A 01/01/2017   Procedure: COLONOSCOPY WITH PROPOFOL;  Surgeon: Jonathon Bellows, MD;  Location: [facility] ENDOSCOPY;  Service: Endoscopy;  Laterality: N/A;  . ELBOW ARTHROSCOPY WITH TENDON RECONSTRUCTION    . ESOPHAGOGASTRODUODENOSCOPY (EGD) WITH PROPOFOL N/A 01/01/2017   Procedure: ESOPHAGOGASTRODUODENOSCOPY (EGD) WITH PROPOFOL;  Surgeon: Jonathon Bellows, MD;  Location[facility] END[facility]COPY;  Service: Endoscopy;  Laterality: N/A;  . INTRATHECAL PUMP IMPLANT    . LEFT HEART CATH AND CORONARY ANGIOGRAPHY N/A 04/28/2017   Procedure: LEFT HEART CATH AND CORONARY ANGIOGRAPHY;  Surgeon: [name], MD;  Location: [place] CV LAB;  Service: Cardiovascular;  Laterality: N/A;  . MASTECTOMY Right 06/1997  . morphine pump  2011  . PLANTAR FASCIA RELEASE    . TRIGGER FINGER RELEASE     Family History  Problem Relation Age of Onset  . Cancer Mother 37       lung  . Thyroid disease Mother   . Cancer Father 13       Pancreatic  . Hypertension Sister   . Cancer Sister        "cancer on face"  . Hyperlipidemia Sister   . Osteoporosis Maternal Grandmother   . Hyperlipidemia Son   . Seizures Son   . Cancer Paternal Grandmother        colon  . Arthritis Paternal Grandfather   . Breast cancer Neg Hx    Social History   Socioeconomic History  . Marital status: Legally Separated    Spouse name: Not on file  . Number of children: Not on file  . Years of education: Not on file  . Highest education level: High school graduate  Occupational History  . Occupation: retired  Tobacco Use  . Smoking status: Former Smoker    Packs/day: 1.00    Years: 30.00    Pack years: 30.00    Types: Cigarettes    Quit date: 04/30/2004    Years since quitting: 15.5  . Smokeless tobacco: Never Used  Substance and Sexual Activity  . Alcohol use: No  . Drug use: No  . Sexual activity: Not Currently      Birth control/protection: Post-menopausal  Other Topics Concern  . Not on file  Social History Narrative  . Not on file   Social Determinants of Health   Financial Resource Strain: Low Risk   . Difficulty of Paying Living Expenses: Not very hard  Food Insecurity:   . Worried About Charity fundraiser in the Last Year:   . Arboriculturist in the Last Year:   Transportation Needs:   . Film/video editor (Medical):   [name] Lack of Transportation (Non-Medical):   Physical Activity:   . Days of Exercise per  Week:   . Minutes of Exercise per Session:   Stress:   . Feeling of Stress :   Social Connections:   . Frequency of Communication with Friends and Family:   . Frequency of Social Gatherings with Friends and Family:   . Attends Religious Services:   . Active Member of Clubs or Organizations:   . Attends Archivist Meetings:   [name] Marital Status:     Outpatient Encounter Medications as of 11/17/2019  Medication Sig  . ACCU-CHEK AVIVA PLUS test strip TEST THREE TIMES DAILY  . Accu-Chek Softclix Lancets lancets TEST BLOOD SUGAR ONE TIME DAILY  . albuterol (PROVENTIL HFA;VENTOLIN HFA) 108 (90 BASE) MCG/ACT inhaler Inhale 2 puffs into the lungs every 6 (six) hours as needed for wheezing or shortness of breath.  [name] aspirin EC 81 MG tablet Take 81 mg by mouth daily.  [name] atorvastatin (LIPITOR) 20 MG tablet Take 1 tablet (20 mg total) by mouth daily.  . cholecalciferol (VITAMIN D3) 25 MCG (1000 UNIT) tablet Take 1,000 Units by mouth daily.  . cyclobenzaprine (FLEXERIL) 5 MG tablet Take 5 mg by mouth 2 (two) times daily as needed.  . diclofenac (VOLTAREN) 50 MG EC tablet TAKE 1 TABLET (50 MG TOTAL) BY MOUTH 3 (THREE) TIMES DAILY.  . Dulaglutide (TRULICITY) 1.5 0000000 SOPN INJECT 1.5MG  (1 PEN) SUBCUTANEOUSLY EVERY WEEK  . Fluticasone-Umeclidin-Vilant (TRELEGY ELLIPTA) 100-62.5-25 MCG/INH AEPB Inhale 1 puff into the lungs daily.  [name] gabapentin (NEURONTIN) 800 MG tablet Take 1  tablet (800 mg total) by mouth 3 (three) times daily.  [name] lisinopril (ZESTRIL) 5 MG tablet TAKE 1 TABLET EVERY DAY  . metFORMIN (GLUCOPHAGE) 1000 MG tablet TAKE 1 TABLET TWICE DAILY WITH MEALS  . metoprolol succinate (TOPROL-XL) 25 MG 24 hr tablet TAKE 1/2 TABLET EVERY DAY  . pantoprazole (PROTONIX) 40 MG tablet TAKE 1 TABLET TWICE DAILY  . TRESIBA FLEXTOUCH 100 UNIT/ML FlexTouch Pen INJECT 30 UNITS INTO THE SKIN AT BEDTIME.  [name] venlafaxine XR (EFFEXOR-XR) 150 MG 24 hr capsule Take 1 capsule (150 mg total) by mouth daily.   No facility-administered encounter medications on file as of 11/17/2019.    Activities of Daily Living In your present state of health, do you have any difficulty performing the following activities: 11/17/2019 01/17/2019  Hearing? N N  Comment no hearing aids -  Vision? N N  Comment reading glasses, [facility] -  Difficulty concentrating or making decisions? N N  Walking or climbing stairs? Y Y  Comment - -  Dressing or bathing? N N  Doing errands, shopping? N N  Preparing Food and eating ? N -  Using the Toilet? N -  In the past six months, have you accidently leaked urine? N -  Do you have problems with loss of bowel control? N -  Managing your Medications? N -  Managing your Finances? N -  Housekeeping or managing your Housekeeping? N -  Some recent data might be hidden    Patient Care Team: [name], NP as PCP - General (Nurse Practitioner) Erby Pian, MD as Referring Physician (Pulmonary Disease) Kandace Blitz, MD as Referring Physician (Pain Medicine) De Hollingshead, [facility] as Pharmacist    Assessment:   This is a routine wellness examination for [place].  Exercise Activities and Dietary recommendations Current Exercise Habits: Home exercise routine, Type of exercise: stretching;strength training/weights, Time (Minutes): 30, Frequency (Times/Week): 4, Weekly Exercise (Minutes/Week): 120, Intensity: Mild, Exercise  limited by: None identified  Goals Addressed   None     Fall Risk: Fall Risk  11/17/2019 08/01/2019 01/17/2019 12/06/2018 12/04/2017  Falls in the past year? 1 0 0 0 No  Number falls in past yr: 0 0 0 - -  Injury with Fall? 0 0 - - -  Follow up - - Falls evaluation completed - -    FALL RISK PREVENTION PERTAINING TO THE HOME:  Any stairs in or around the home? No  If so, are there any without handrails? Yes   Home free of loose throw rugs in walkways, pet beds, electrical cords, etc? Yes  Adequate lighting in your home to reduce risk of falls? Yes   ASSISTIVE DEVICES UTILIZED TO PREVENT FALLS:  Life alert? No  Use of a cane, walker or w/c? No  Grab bars in the bathroom? Yes  Shower chair or bench in shower? No  Elevated toilet seat or a handicapped toilet? No   DME ORDERS:  DME order needed?  No   TIMED UP AND GO:  Unable to perform    Depression Screen PHQ 2/9 Scores 11/15/2019 08/01/2019 07/19/2019 01/17/2019  PHQ - 2 Score 3 1 0 0  PHQ- 9 Score 9 7 6 5      Cognitive Function     6CIT Screen 12/06/2018 12/04/2017  What Year? 0 points 0 points  What month? 0 points 0 points  What time? 0 points 0 points  Count back from 20 0 points 0 points  Months in reverse 0 points 0 points  Repeat phrase 0 points 0 points  Total Score 0 0    Immunization History  Administered Date(s) Administered  . Fluad Quad(high Dose 65+) 04/19/2019  . Influenza, High Dose Seasonal PF 04/28/2016, 04/13/2017, 04/14/2018  . Influenza,inj,Quad PF,6+ Mos 06/18/2015  . Influenza-Unspecified 04/04/2014  . PFIZER SARS-COV-2 Vaccination 10/20/2019, 11/10/2019  . Pneumococcal Conjugate-13 06/18/2015  . Pneumococcal Polysaccharide-23 08/06/2005, 08/05/2016  . Td 08/28/2007, 01/17/2019    Qualifies for Shingles Vaccine? Yes  Zostavax completed n/a. Due for Shingrix. Education has been provided regarding the importance of this vaccine. Pt has been advised to call insurance company to determine  out of pocket expense. Advised may also receive vaccine at local pharmacy or Health Dept. Verbalized acceptance and understanding.  Tdap: up to date   Flu Vaccine: up to date   Pneumococcal Vaccine: up to date   Covid-19 Vaccine: Completed vaccines  Screening Tests Health Maintenance  Topic Date Due  . OPHTHALMOLOGY EXAM  02/15/2020  . INFLUENZA VACCINE  03/04/2020  . HEMOGLOBIN A1C  05/16/2020  . FOOT EXAM  10/02/2020  . MAMMOGRAM  03/09/2021  . COLONOSCOPY  01/02/2027  . TETANUS/TDAP  01/16/2029  . DEXA SCAN  Completed  . Hepatitis C Screening  Completed  . PNA vac Low Risk Adult  Completed    Cancer Screenings:  Colorectal Screening: Completed 12/14/2016. Repeat every 10 years  Mammogram: Completed 03/30/2019. Repeat every year.  Bone Density: Completed 02/25/2016.   Lung Cancer Screening: (Low Dose CT Chest recommended if Age 43-80 years, 30 pack-year currently smoking OR have quit w/in 15years.) does not qualify.     Additional Screening:  Hepatitis C Screening: does qualify; Completed 2016  Vision Screening: Recommended annual ophthalmology exams for early detection of glaucoma and other disorders of the eye. Is the patient up to date with their annual eye exam?  No  Who is the provider or what is the name of the office in which the pt attends annual eye  exams? [facility]   Dental Screening: Recommended annual dental exams for proper oral hygiene  Community Resource Referral:  CRR required this visit?  No       Plan:  I have personally reviewed and addressed the Medicare Annual Wellness questionnaire and have noted the following in the patient's chart:  A. Medical and social history B. Use of alcohol, tobacco or illicit drugs  C. Current medications and supplements D. Functional ability and status E.  Nutritional status F.  Physical activity G. Advance directives H. List of other physicians I.  Hospitalizations, surgeries, and ER visits in  previous 12 months J.  [place] such as hearing and vision if needed, cognitive and depression L. Referrals and appointments   In addition, I have reviewed and discussed with patient certain preventive protocols, quality metrics, and best practice recommendations. A written personalized care plan for preventive services as well as general preventive health recommendations were provided to patient.  Signed,    Bevelyn Ngo, LPN  [phone] Nurse Health Advisor   Nurse Notes: none

## 2019-11-17 NOTE — Patient Instructions (Signed)
Jordan Chan , Thank you for taking time to come for your Medicare Wellness Visit. I appreciate your ongoing commitment to your health goals. Please review the following plan we discussed and let me know if I can assist you in the future.   Screening recommendations/referrals: Colonoscopy: up to date  Mammogram: up to date Bone Density: up to date  Recommended yearly ophthalmology/optometry visit for glaucoma screening and checkup Recommended yearly dental visit for hygiene and checkup  Vaccinations: Influenza vaccine: up to date Pneumococcal vaccine: up to date  Tdap vaccine: up to date  Shingles vaccine: shingrix eligible    Covid-19: completed   Advanced directives: Please bring a copy of your health care power of attorney and living will to the office at your convenience.  Conditions/risks identified: none   Next appointment: Follow up in one year for your annual wellness visit    Preventive Care 65 Years and Older, Female Preventive care refers to lifestyle choices and visits with your health care provider that can promote health and wellness. What does preventive care include?  A yearly physical exam. This is also called an annual well check.  Dental exams once or twice a year.  Routine eye exams. Ask your health care provider how often you should have your eyes checked.  Personal lifestyle choices, including:  Daily care of your teeth and gums.  Regular physical activity.  Eating a healthy diet.  Avoiding tobacco and drug use.  Limiting alcohol use.  Practicing safe sex.  Taking low-dose aspirin every day.  Taking vitamin and mineral supplements as recommended by your health care provider. What happens during an annual well check? The services and screenings done by your health care provider during your annual well check will depend on your age, overall health, lifestyle risk factors, and family history of disease. Counseling  Your health care provider may  ask you questions about your:  Alcohol use.  Tobacco use.  Drug use.  Emotional well-being.  Home and relationship well-being.  Sexual activity.  Eating habits.  History of falls.  Memory and ability to understand (cognition).  Work and work Statistician.  Reproductive health. Screening  You may have the following tests or measurements:  Height, weight, and BMI.  Blood pressure.  Lipid and cholesterol levels. These may be checked every 5 years, or more frequently if you are over 33 years old.  Skin check.  Lung cancer screening. You may have this screening every year starting at age 45 if you have a 30-pack-year history of smoking and currently smoke or have quit within the past 15 years.  Fecal occult blood test (FOBT) of the stool. You may have this test every year starting at age 20.  Flexible sigmoidoscopy or colonoscopy. You may have a sigmoidoscopy every 5 years or a colonoscopy every 10 years starting at age 46.  Hepatitis C blood test.  Hepatitis B blood test.  Sexually transmitted disease (STD) testing.  Diabetes screening. This is done by checking your blood sugar (glucose) after you have not eaten for a while (fasting). You may have this done every 1-3 years.  Bone density scan. This is done to screen for osteoporosis. You may have this done starting at age 53.  Mammogram. This may be done every 1-2 years. Talk to your health care provider about how often you should have regular mammograms. Talk with your health care provider about your test results, treatment options, and if necessary, the need for more tests. Vaccines  Your health  care provider may recommend certain vaccines, such as:  Influenza vaccine. This is recommended every year.  Tetanus, diphtheria, and acellular pertussis (Tdap, Td) vaccine. You may need a Td booster every 10 years.  Zoster vaccine. You may need this after age 83.  Pneumococcal 13-valent conjugate (PCV13) vaccine. One  dose is recommended after age 52.  Pneumococcal polysaccharide (PPSV23) vaccine. One dose is recommended after age 76. Talk to your health care provider about which screenings and vaccines you need and how often you need them. This information is not intended to replace advice given to you by your health care provider. Make sure you discuss any questions you have with your health care provider. Document Released: 08/17/2015 Document Revised: 04/09/2016 Document Reviewed: 05/22/2015 Elsevier Interactive Patient Education  2017 [employer] Prevention in the Home Falls can cause injuries. They can happen to people of all ages. There are many things you can do to make your home safe and to help prevent falls. What can I do on the outside of my home?  Regularly fix the edges of walkways and driveways and fix any cracks.  Remove anything that might make you trip as you walk through a door, such as a raised step or threshold.  Trim any bushes or trees on the path to your home.  Use bright outdoor lighting.  Clear any walking paths of anything that might make someone trip, such as rocks or tools.  Regularly check to see if handrails are loose or broken. Make sure that both sides of any steps have handrails.  Any raised decks and porches should have guardrails on the edges.  Have any leaves, snow, or ice cleared regularly.  Use sand or salt on walking paths during winter.  Clean up any spills in your garage right away. This includes oil or grease spills. What can I do in the bathroom?  Use night lights.  Install grab bars by the toilet and in the tub and shower. Do not use towel bars as grab bars.  Use non-skid mats or decals in the tub or shower.  If you need to sit down in the shower, use a plastic, non-slip stool.  Keep the floor dry. Clean up any water that spills on the floor as soon as it happens.  Remove soap buildup in the tub or shower regularly.  Attach bath  mats securely with double-sided non-slip rug tape.  Do not have throw rugs and other things on the floor that can make you trip. What can I do in the bedroom?  Use night lights.  Make sure that you have a light by your bed that is easy to reach.  Do not use any sheets or blankets that are too big for your bed. They should not hang down onto the floor.  Have a firm chair that has side arms. You can use this for support while you get dressed.  Do not have throw rugs and other things on the floor that can make you trip. What can I do in the kitchen?  Clean up any spills right away.  Avoid walking on wet floors.  Keep items that you use a lot in easy-to-reach places.  If you need to reach something above you, use a strong step stool that has a grab bar.  Keep electrical cords out of the way.  Do not use floor polish or wax that makes floors slippery. If you must use wax, use non-skid floor wax.  Do not have  throw rugs and other things on the floor that can make you trip. What can I do with my stairs?  Do not leave any items on the stairs.  Make sure that there are handrails on both sides of the stairs and use them. Fix handrails that are broken or loose. Make sure that handrails are as long as the stairways.  Check any carpeting to make sure that it is firmly attached to the stairs. Fix any carpet that is loose or worn.  Avoid having throw rugs at the top or bottom of the stairs. If you do have throw rugs, attach them to the floor with carpet tape.  Make sure that you have a light switch at the top of the stairs and the bottom of the stairs. If you do not have them, ask someone to add them for you. What else can I do to help prevent falls?  Wear shoes that:  Do not have high heels.  Have rubber bottoms.  Are comfortable and fit you well.  Are closed at the toe. Do not wear sandals.  If you use a stepladder:  Make sure that it is fully opened. Do not climb a closed  stepladder.  Make sure that both sides of the stepladder are locked into place.  Ask someone to hold it for you, if possible.  Clearly mark and make sure that you can see:  Any grab bars or handrails.  First and last steps.  Where the edge of each step is.  Use tools that help you move around (mobility aids) if they are needed. These include:  Canes.  Walkers.  Scooters.  Crutches.  Turn on the lights when you go into a dark area. Replace any light bulbs as soon as they burn out.  Set up your furniture so you have a clear path. Avoid moving your furniture around.  If any of your floors are uneven, fix them.  If there are any pets around you, be aware of where they are.  Review your medicines with your doctor. Some medicines can make you feel dizzy. This can increase your chance of falling. Ask your doctor what other things that you can do to help prevent falls. This information is not intended to replace advice given to you by your health care provider. Make sure you discuss any questions you have with your health care provider. Document Released: 05/17/2009 Document Revised: 12/27/2015 Document Reviewed: 08/25/2014 Elsevier Interactive Patient Education  2017 [employer].

## 2019-11-23 ENCOUNTER — Other Ambulatory Visit: Payer: Self-pay | Admitting: Nurse Practitioner

## 2019-11-23 NOTE — Telephone Encounter (Signed)
Requested medication (s) are due for refill today:yes  Requested medication (s) are on the active medication list: {yes  Last refill:  07/19/19  Future visit scheduled: yes  Notes to clinic:  Off protocol    Requested Prescriptions  Pending Prescriptions [facility]p [place] 100-62.5-25 MCG/INH AEPB [Pharmacy Med Name: Donnal [facility]bar 100-62.5-25 MCG/INH Aerosol Powder Breath Activated] 180 each     Sig: Inhale 1 puff into the lungs daily.      Off-Protocol Failed - 11/23/2019  4:41 PM      Failed - Medication not assigned to a protocol, review manually.      Passed - Valid encounter within last 12 months    Recent Outpatient Visits           1 week ago Type 2 diabetes mellitus with diabetic neuropathy, with long-term current use of insulin ([place])   [place], [place] T, NP   1 month ago Type 2 diabetes mellitus with diabetic neuropathy, with long-term current use of insulin ([place])   [place], [place] T, NP   3 months ago COPD, moderate ([place])   [place], [place] T, NP   3 months ago Weakness of left lower extremity   [facility] [place]   4 months ago Type 2 diabetes mellitus with diabetic neuropathy, with long-term current use of insulin ([place])   [place], [name], NP       Future Appointments             In 2 months [name], [name], NP [facility]   In 15 months  [facility]

## 2019-11-30 ENCOUNTER — Encounter: Payer: Self-pay | Admitting: Nurse Practitioner

## 2019-11-30 ENCOUNTER — Other Ambulatory Visit: Payer: Self-pay

## 2019-11-30 ENCOUNTER — Ambulatory Visit (INDEPENDENT_AMBULATORY_CARE_PROVIDER_SITE_OTHER): Payer: Medicare HMO | Admitting: Nurse Practitioner

## 2019-11-30 DIAGNOSIS — K130 Diseases of lips: Secondary | ICD-10-CM

## 2019-11-30 MED ORDER — FLUCONAZOLE 150 MG PO TABS: 150.0000 mg | ORAL_TABLET | Freq: Once | ORAL | 0 refills | Status: AC

## 2019-11-30 MED ORDER — CLOTRIMAZOLE 1 % EX CREA: 1.0000 "application " | TOPICAL_CREAM | Freq: Two times a day (BID) | CUTANEOUS | 0 refills | Status: DC

## 2019-11-30 NOTE — Patient Instructions (Signed)
Oral Thrush, Adult  Oral thrush is an infection in your mouth and throat. It causes white patches on your tongue and in your mouth. Follow these instructions at home: Helping with soreness   To lessen your pain: ? Drink cold liquids, like water and iced tea. ? Eat frozen ice pops or frozen juices. ? Eat foods that are easy to swallow, like gelatin and ice cream. ? Drink from a straw if the patches in your mouth are painful. General instructions  Take or use over-the-counter and prescription medicines only as told by your doctor. Medicine for oral thrush may be something to swallow, or it may be something to put on the infected area.  Eat plain yogurt that has live cultures in it. Read the label to make sure.  If you wear dentures: ? Take out your dentures before you go to bed. ? Brush them well. ? Soak them in a denture cleaner.  Rinse your mouth with warm salt-water many times a day. To make the salt-water mixture, completely dissolve 1/2-1 teaspoon of salt in 1 cup of warm water. Contact a doctor if:  Your problems are getting worse.  Your problems do not get better in less than 7 days with treatment.  Your infection is spreading. This may show as white patches on the skin outside of your mouth.  You are nursing your baby and you have redness and pain in the nipples. This information is not intended to replace advice given to you by your health care provider. Make sure you discuss any questions you have with your health care provider. Document Revised: 10/23/2017 Document Reviewed: 04/14/2016 Elsevier Patient Education  2020 [employer] Inc[employer]  

## 2019-11-30 NOTE — Assessment & Plan Note (Signed)
Acute and ongoing, ? fungal aspect due to appearance and this starting with Jardiance.  She also had vaginal yeast with Jardiance, medication was discontinued months back.  At this time will send in script for Clotrimazole cream to place to areas and x one dose of Fluconazole, scripts sent.  Avoid Vaseline for now, to avoid excess moisture to areas.  Return in 2 weeks for follow-up, sooner if worsening.  If no improvement, then will trial steroid cream.

## 2019-11-30 NOTE — Progress Notes (Signed)
BP 128/78 (BP Location: Left Arm)   Pulse 90   Temp 98.1 F (36.7 C) (Oral)   Wt 142 lb 6.4 oz (64.6 kg)   LMP  (LMP Unknown)   SpO2 95%   BMI 29.26 kg/m    Subjective:    [name], female    DOB: 1951-03-30, 69 y.o.   MRN: [id number]  HPI: Jordan Chan is a 69 y.o. female  Chief Complaint  Patient presents with  . Sore    pt states she has had a sore in the corner of her mouth since February, states it is not feeling    SORE MOUTH: Has had issues with sides of mouth cracking and being sore since starting Jardiance in February, this was discontinued.  Has tried Aquaphor and Vaseline. Aquaphor caused areas to burn.  Vaseline helps to some extent, but continues to have ongoing cracking to side of mouth, now has area to inside of bottom lip and outside.  Reports this is burning and painful, 4-5/10.  When she eats or talks a lot this makes area worse.  Takes B12 injections monthly and recent B12 level 988, does have lip cracking with this when levels low, but reports that current issue is different.    Relevant past medical, surgical, family and social history reviewed and updated as indicated. Interim medical history since our last visit reviewed. Allergies and medications reviewed and updated.  Review of Systems  Constitutional: Negative for activity change, appetite change, diaphoresis, fatigue and fever.  HENT: Positive for mouth sores.   Respiratory: Negative for cough, chest tightness and shortness of breath.   Cardiovascular: Negative for chest pain, palpitations and leg swelling.  Psychiatric/Behavioral: Negative.     Per HPI unless specifically indicated above     Objective:    BP 128/78 (BP Location: Left Arm)   Pulse 90   Temp 98.1 F (36.7 C) (Oral)   Wt 142 lb 6.4 oz (64.6 kg)   LMP  (LMP Unknown)   SpO2 95%   BMI 29.26 kg/m   Wt Readings from Last 3 Encounters:  11/30/19 142 lb 6.4 oz (64.6 kg)  11/17/19 145 lb (65.8 kg)    11/15/19 145 lb (65.8 kg)    Physical Exam Vitals and nursing note reviewed.  Constitutional:      General: She is awake. She is not in acute distress.    Appearance: She is well-developed and well-groomed. She is not ill-appearing.  HENT:     Head: Normocephalic.     Right Ear: Hearing normal.     Left Ear: Hearing normal.     Nose: Nose normal.     Mouth/Throat:     Lips: Pink.     Mouth: Mucous membranes are dry. Lacerations present. No oral lesions.     Tongue: No lesions.     Palate: No lesions.     Pharynx: Oropharynx is clear.     Comments: Exterior corners of mouth with xerosis and erythema R>L.  Right side with cracking present and white/yellow discoloration in middle aspect, with erythema around exterior.  X 1 area of cracking to right side of bottom lip.  No oral lesions. Eyes:     General: Lids are normal.        Right eye: No discharge.        Left eye: No discharge.     Conjunctiva/sclera: Conjunctivae normal.     Pupils: Pupils are equal, round, and reactive to light.  Neck:     Thyroid: No thyromegaly.     Vascular: No carotid bruit or JVD.  Cardiovascular:     Rate and Rhythm: Normal rate and regular rhythm.     Heart sounds: Normal heart sounds. No murmur. No gallop.   Pulmonary:     Effort: Pulmonary effort is normal.     Breath sounds: Normal breath sounds.  Abdominal:     General: Bowel sounds are normal.     Palpations: Abdomen is soft. There is no hepatomegaly or splenomegaly.  Musculoskeletal:     Cervical back: Normal range of motion and neck supple.     Right lower leg: No edema.     Left lower leg: No edema.  Lymphadenopathy:     Cervical: No cervical adenopathy.  Skin:    General: Skin is warm and dry.  Neurological:     Mental Status: She is alert and oriented to person, place, and time.  Psychiatric:        Attention and Perception: Attention normal.        Mood and Affect: Mood normal.        Behavior: Behavior normal. Behavior is  cooperative.        Thought Content: Thought content normal.        Judgment: Judgment normal.     Results for orders placed or performed in visit on 11/15/19  Bayer DCA Hb A1c Waived  Result Value Ref Range   HB A1C (BAYER DCA - WAIVED) 7.0 (H) [id number] %  Basic metabolic panel  Result Value Ref Range   Glucose 106 (H) 65 - 99 mg/dL   BUN 9 8 - 27 mg/dL   Creatinine, Ser 0.75 0.57 - 1.00 mg/dL   GFR calc non Af Amer 82 >59 mL/min/1.73   GFR calc Af Amer 94 >59 mL/min/1.73   BUN/Creatinine Ratio 12 12 - 28   Sodium 142 134 - 144 mmol/L   Potassium 5.0 3.5 - 5.2 mmol/L   Chloride 103 96 - 106 mmol/L   CO2 26 20 - 29 mmol/L   Calcium 9.3 8.7 - 10.3 mg/dL  Microalbumin, Urine Waived  Result Value Ref Range   Microalb, Ur Waived 10 0 - 19 mg/L   Creatinine, Urine Waived 50 10 - 300 mg/dL   Microalb/Creat Ratio 30-300 (H) <30 mg/g  Lipid Panel w/o Chol/HDL Ratio  Result Value Ref Range   Cholesterol, Total 128 100 - 199 mg/dL   Triglycerides 65 0 - 149 mg/dL   HDL 69 >39 mg/dL   VLDL Cholesterol Cal 14 5 - 40 mg/dL   LDL Chol Calc (NIH) 45 0 - 99 mg/dL  Vitamin B12  Result Value Ref Range   Vitamin B-12 988 232 - 1,245 pg/mL      Assessment & Plan:   Problem List Items Addressed This Visit      Digestive   Angular cheilitis    Acute and ongoing, ? fungal aspect due to appearance and this starting with Jardiance.  She also had vaginal yeast with Jardiance, medication was discontinued months back.  At this time will send in script for Clotrimazole cream to place to areas and x one dose of Fluconazole, scripts sent.  Avoid Vaseline for now, to avoid excess moisture to areas.  Return in 2 weeks for follow-up, sooner if worsening.  If no improvement, then will trial steroid cream.          Follow up plan: Return in about 2 weeks (around  12/14/2019) for Mouth sores.

## 2019-12-02 ENCOUNTER — Other Ambulatory Visit: Payer: Self-pay | Admitting: Nurse Practitioner

## 2019-12-07 ENCOUNTER — Encounter: Payer: Self-pay | Admitting: Nurse Practitioner

## 2019-12-07 ENCOUNTER — Other Ambulatory Visit: Payer: Self-pay

## 2019-12-07 ENCOUNTER — Ambulatory Visit (INDEPENDENT_AMBULATORY_CARE_PROVIDER_SITE_OTHER): Payer: Medicare HMO | Admitting: Nurse Practitioner

## 2019-12-07 DIAGNOSIS — K130 Diseases of lips: Secondary | ICD-10-CM | POA: Diagnosis not present

## 2019-12-07 MED ORDER — TRIAMCINOLONE ACETONIDE 0.1 % EX OINT: 1.0000 "application " | TOPICAL_OINTMENT | Freq: Two times a day (BID) | CUTANEOUS | 0 refills | Status: DC

## 2019-12-07 MED ORDER — PREDNISONE 20 MG PO TABS: 40.0000 mg | ORAL_TABLET | Freq: Every day | ORAL | 0 refills | Status: DC

## 2019-12-07 NOTE — Assessment & Plan Note (Signed)
Acute and ongoing, started with Jardiance -- poor response to recent treatment.  She also had vaginal yeast with Jardiance, medication was discontinued months back.  At this time will send in script for Triamcinolone ointment to apply to area for 2 weeks and then script for Prednisone for 5 days, is aware this may bump up her blood sugars and to monitor closelt.  Return in 1 week for follow-up, sooner if worsening.  If no improvement, then will refer to dermatology for further recommendations. She is in agreeance with plan of care.

## 2019-12-07 NOTE — Patient Instructions (Signed)

## 2019-12-07 NOTE — Progress Notes (Signed)
BP 115/72   Pulse 91   Temp 98.3 F (36.8 C) (Oral)   Wt 142 lb (64.4 kg)   LMP  (LMP Unknown)   SpO2 91%   BMI 29.17 kg/m    Subjective:    [name], female    DOB: 06-14-1951, 69 y.o.   MRN: [id number]  HPI: Jordan Chan is a 69 y.o. female  Chief Complaint  Patient presents with  . Jaw Pain    lower right jaw, patient states the pain is still there but size has gone down some    SORE MOUTH: Has had issues with sides of mouth cracking and being sore since starting Jardiance in February, this was discontinued.  Has tried Aquaphor and Vaseline. Aquaphor caused areas to burn.  Vaseline helps to some extent, but continues to have ongoing cracking to side of mouth, now has area to inside of bottom lip and outside.  Last visit on 11/30/19 started on Clotrimazole and provided one dose Fluconazole, which has only improved a little.  States she continues to have some pain at area and yesterday her jaw was swollen and sore to right side when she woke-up, but this has now improved today.  Does report jaw continues to be slightly tender to touch to right side.  Reports lip pain is a burning and painful, 4-5/10.  When she eats or talks a lot this makes area worse.  Takes B12 injections monthly and recent B12 level 988, does have lip cracking with this when levels low, but reports that current issue is different.    Relevant past medical, surgical, family and social history reviewed and updated as indicated. Interim medical history since our last visit reviewed. Allergies and medications reviewed and updated.  Review of Systems  Constitutional: Negative for activity change, appetite change, diaphoresis, fatigue and fever.  HENT: Positive for mouth sores.   Respiratory: Negative for cough, chest tightness and shortness of breath.   Cardiovascular: Negative for chest pain, palpitations and leg swelling.  Psychiatric/Behavioral: Negative.     Per HPI unless  specifically indicated above     Objective:    BP 115/72   Pulse 91   Temp 98.3 F (36.8 C) (Oral)   Wt 142 lb (64.4 kg)   LMP  (LMP Unknown)   SpO2 91%   BMI 29.17 kg/m   Wt Readings from Last 3 Encounters:  12/07/19 142 lb (64.4 kg)  11/30/19 142 lb 6.4 oz (64.6 kg)  11/17/19 145 lb (65.8 kg)    Physical Exam Vitals and nursing note reviewed.  Constitutional:      General: She is awake. She is not in acute distress.    Appearance: She is well-developed and well-groomed. She is not ill-appearing.  HENT:     Head: Normocephalic.     Right Ear: Hearing normal.     Left Ear: Hearing normal.     Nose: Nose normal.     Mouth/Throat:     Lips: Pink.     Mouth: Mucous membranes are dry. Lacerations present. No oral lesions.     Tongue: No lesions.     Palate: No lesions.     Pharynx: Oropharynx is clear.     Comments: Exterior corners of mouth with xerosis and erythema R>L.  Right side with cracking present and white/yellow discoloration in middle aspect, with erythema around exterior.  X 1 area of cracking to right side of bottom lip.  No oral lesions.  Mild  tenderness on palpation right jaw with no inflammation noted or swollen lymph nodes.  No oral lesions. Eyes:     General: Lids are normal.        Right eye: No discharge.        Left eye: No discharge.     Conjunctiva/sclera: Conjunctivae normal.     Pupils: Pupils are equal, round, and reactive to light.  Neck:     Thyroid: No thyromegaly.     Vascular: No carotid bruit or JVD.  Cardiovascular:     Rate and Rhythm: Normal rate and regular rhythm.     Heart sounds: Normal heart sounds. No murmur. No gallop.   Pulmonary:     Effort: Pulmonary effort is normal.     Breath sounds: Normal breath sounds.  Abdominal:     General: Bowel sounds are normal.     Palpations: Abdomen is soft. There is no hepatomegaly or splenomegaly.  Musculoskeletal:     Cervical back: Normal range of motion and neck supple.     Right  lower leg: No edema.     Left lower leg: No edema.  Lymphadenopathy:     Cervical: No cervical adenopathy.  Skin:    General: Skin is warm and dry.  Neurological:     Mental Status: She is alert and oriented to person, place, and time.  Psychiatric:        Attention and Perception: Attention normal.        Mood and Affect: Mood normal.        Behavior: Behavior normal. Behavior is cooperative.        Thought Content: Thought content normal.        Judgment: Judgment normal.     Results for orders placed or performed in visit on 11/15/19  Bayer DCA Hb A1c Waived  Result Value Ref Range   HB A1C (BAYER DCA - WAIVED) 7.0 (H) [id number] %  Basic metabolic panel  Result Value Ref Range   Glucose 106 (H) 65 - 99 mg/dL   BUN 9 8 - 27 mg/dL   Creatinine, Ser 0.75 0.57 - 1.00 mg/dL   GFR calc non Af Amer 82 >59 mL/min/1.73   GFR calc Af Amer 94 >59 mL/min/1.73   BUN/Creatinine Ratio 12 12 - 28   Sodium 142 134 - 144 mmol/L   Potassium 5.0 3.5 - 5.2 mmol/L   Chloride 103 96 - 106 mmol/L   CO2 26 20 - 29 mmol/L   Calcium 9.3 8.7 - 10.3 mg/dL  Microalbumin, Urine Waived  Result Value Ref Range   Microalb, Ur Waived 10 0 - 19 mg/L   Creatinine, Urine Waived 50 10 - 300 mg/dL   Microalb/Creat Ratio 30-300 (H) <30 mg/g  Lipid Panel w/o Chol/HDL Ratio  Result Value Ref Range   Cholesterol, Total 128 100 - 199 mg/dL   Triglycerides 65 0 - 149 mg/dL   HDL 69 >39 mg/dL   VLDL Cholesterol Cal 14 5 - 40 mg/dL   LDL Chol Calc (NIH) 45 0 - 99 mg/dL  Vitamin B12  Result Value Ref Range   Vitamin B-12 988 232 - 1,245 pg/mL      Assessment & Plan:   Problem List Items Addressed This Visit      Digestive   Angular cheilitis    Acute and ongoing, started with Jardiance -- poor response to recent treatment.  She also had vaginal yeast with Jardiance, medication was discontinued months back.  At this time  will send in script for Triamcinolone ointment to apply to area for 2 weeks and then script  for Prednisone for 5 days, is aware this may bump up her blood sugars and to monitor closelt.  Return in 1 week for follow-up, sooner if worsening.  If no improvement, then will refer to dermatology for further recommendations. She is in agreeance with plan of care.          Follow up plan: Return in about 1 week (around 12/14/2019) for Mouth pain.

## 2019-12-11 ENCOUNTER — Other Ambulatory Visit: Payer: Self-pay

## 2019-12-11 ENCOUNTER — Encounter: Payer: Self-pay | Admitting: Emergency Medicine

## 2019-12-11 ENCOUNTER — Emergency Department: Payer: Medicare HMO

## 2019-12-11 ENCOUNTER — Inpatient Hospital Stay
Admission: EM | Admit: 2019-12-11 | Discharge: 2019-12-20 | DRG: 871 | Disposition: A | Discharge status: Home Health | Payer: Medicare HMO | Attending: Internal Medicine | Admitting: Internal Medicine

## 2019-12-11 DIAGNOSIS — J441 Chronic obstructive pulmonary disease with (acute) exacerbation: Secondary | ICD-10-CM | POA: Diagnosis not present

## 2019-12-11 DIAGNOSIS — K120 Recurrent oral aphthae: Secondary | ICD-10-CM | POA: Diagnosis present

## 2019-12-11 DIAGNOSIS — B370 Candidal stomatitis: Secondary | ICD-10-CM

## 2019-12-11 DIAGNOSIS — K219 Gastro-esophageal reflux disease without esophagitis: Secondary | ICD-10-CM | POA: Diagnosis present

## 2019-12-11 DIAGNOSIS — Z885 Allergy status to narcotic agent status: Secondary | ICD-10-CM

## 2019-12-11 DIAGNOSIS — Z90710 Acquired absence of both cervix and uterus: Secondary | ICD-10-CM

## 2019-12-11 DIAGNOSIS — Z9981 Dependence on supplemental oxygen: Secondary | ICD-10-CM

## 2019-12-11 DIAGNOSIS — D509 Iron deficiency anemia, unspecified: Secondary | ICD-10-CM | POA: Diagnosis present

## 2019-12-11 DIAGNOSIS — K5900 Constipation, unspecified: Secondary | ICD-10-CM | POA: Diagnosis not present

## 2019-12-11 DIAGNOSIS — E119 Type 2 diabetes mellitus without complications: Secondary | ICD-10-CM | POA: Diagnosis not present

## 2019-12-11 DIAGNOSIS — Z8673 Personal history of transient ischemic attack (TIA), and cerebral infarction without residual deficits: Secondary | ICD-10-CM

## 2019-12-11 DIAGNOSIS — G4700 Insomnia, unspecified: Secondary | ICD-10-CM | POA: Diagnosis present

## 2019-12-11 DIAGNOSIS — I10 Essential (primary) hypertension: Secondary | ICD-10-CM | POA: Diagnosis present

## 2019-12-11 DIAGNOSIS — M545 Low back pain: Secondary | ICD-10-CM | POA: Diagnosis not present

## 2019-12-11 DIAGNOSIS — Z853 Personal history of malignant neoplasm of breast: Secondary | ICD-10-CM | POA: Diagnosis present

## 2019-12-11 DIAGNOSIS — I708 Atherosclerosis of other arteries: Secondary | ICD-10-CM | POA: Diagnosis not present

## 2019-12-11 DIAGNOSIS — R0602 Shortness of breath: Secondary | ICD-10-CM

## 2019-12-11 DIAGNOSIS — Z881 Allergy status to other antibiotic agents status: Secondary | ICD-10-CM

## 2019-12-11 DIAGNOSIS — Z9011 Acquired absence of right breast and nipple: Secondary | ICD-10-CM

## 2019-12-11 DIAGNOSIS — J181 Lobar pneumonia, unspecified organism: Secondary | ICD-10-CM | POA: Diagnosis present

## 2019-12-11 DIAGNOSIS — J449 Chronic obstructive pulmonary disease, unspecified: Secondary | ICD-10-CM | POA: Diagnosis not present

## 2019-12-11 DIAGNOSIS — F419 Anxiety disorder, unspecified: Secondary | ICD-10-CM | POA: Diagnosis present

## 2019-12-11 DIAGNOSIS — K121 Other forms of stomatitis: Secondary | ICD-10-CM | POA: Diagnosis present

## 2019-12-11 DIAGNOSIS — K5903 Drug induced constipation: Secondary | ICD-10-CM | POA: Diagnosis not present

## 2019-12-11 DIAGNOSIS — Z8349 Family history of other endocrine, nutritional and metabolic diseases: Secondary | ICD-10-CM

## 2019-12-11 DIAGNOSIS — J9621 Acute and chronic respiratory failure with hypoxia: Secondary | ICD-10-CM | POA: Diagnosis not present

## 2019-12-11 DIAGNOSIS — E1169 Type 2 diabetes mellitus with other specified complication: Secondary | ICD-10-CM | POA: Diagnosis present

## 2019-12-11 DIAGNOSIS — I959 Hypotension, unspecified: Secondary | ICD-10-CM | POA: Diagnosis not present

## 2019-12-11 DIAGNOSIS — Z79899 Other long term (current) drug therapy: Secondary | ICD-10-CM

## 2019-12-11 DIAGNOSIS — E785 Hyperlipidemia, unspecified: Secondary | ICD-10-CM | POA: Diagnosis present

## 2019-12-11 DIAGNOSIS — E1140 Type 2 diabetes mellitus with diabetic neuropathy, unspecified: Secondary | ICD-10-CM | POA: Diagnosis present

## 2019-12-11 DIAGNOSIS — E7800 Pure hypercholesterolemia, unspecified: Secondary | ICD-10-CM | POA: Diagnosis present

## 2019-12-11 DIAGNOSIS — G894 Chronic pain syndrome: Secondary | ICD-10-CM | POA: Diagnosis present

## 2019-12-11 DIAGNOSIS — E876 Hypokalemia: Secondary | ICD-10-CM | POA: Diagnosis not present

## 2019-12-11 DIAGNOSIS — R0902 Hypoxemia: Secondary | ICD-10-CM | POA: Diagnosis not present

## 2019-12-11 DIAGNOSIS — Z978 Presence of other specified devices: Secondary | ICD-10-CM

## 2019-12-11 DIAGNOSIS — A419 Sepsis, unspecified organism: Principal | ICD-10-CM

## 2019-12-11 DIAGNOSIS — Z923 Personal history of irradiation: Secondary | ICD-10-CM

## 2019-12-11 DIAGNOSIS — R079 Chest pain, unspecified: Secondary | ICD-10-CM | POA: Diagnosis not present

## 2019-12-11 DIAGNOSIS — Z87891 Personal history of nicotine dependence: Secondary | ICD-10-CM

## 2019-12-11 DIAGNOSIS — C50911 Malignant neoplasm of unspecified site of right female breast: Secondary | ICD-10-CM | POA: Diagnosis present

## 2019-12-11 DIAGNOSIS — Z9221 Personal history of antineoplastic chemotherapy: Secondary | ICD-10-CM

## 2019-12-11 DIAGNOSIS — Z809 Family history of malignant neoplasm, unspecified: Secondary | ICD-10-CM

## 2019-12-11 DIAGNOSIS — T402X5A Adverse effect of other opioids, initial encounter: Secondary | ICD-10-CM | POA: Diagnosis present

## 2019-12-11 DIAGNOSIS — Z794 Long term (current) use of insulin: Secondary | ICD-10-CM

## 2019-12-11 DIAGNOSIS — J90 Pleural effusion, not elsewhere classified: Secondary | ICD-10-CM | POA: Diagnosis not present

## 2019-12-11 DIAGNOSIS — H9201 Otalgia, right ear: Secondary | ICD-10-CM | POA: Diagnosis not present

## 2019-12-11 DIAGNOSIS — R6520 Severe sepsis without septic shock: Secondary | ICD-10-CM | POA: Diagnosis present

## 2019-12-11 DIAGNOSIS — Z7982 Long term (current) use of aspirin: Secondary | ICD-10-CM

## 2019-12-11 DIAGNOSIS — I471 Supraventricular tachycardia, unspecified: Secondary | ICD-10-CM

## 2019-12-11 DIAGNOSIS — M5489 Other dorsalgia: Secondary | ICD-10-CM | POA: Diagnosis not present

## 2019-12-11 DIAGNOSIS — R110 Nausea: Secondary | ICD-10-CM | POA: Diagnosis not present

## 2019-12-11 DIAGNOSIS — R52 Pain, unspecified: Secondary | ICD-10-CM | POA: Diagnosis not present

## 2019-12-11 DIAGNOSIS — R000 Tachycardia, unspecified: Secondary | ICD-10-CM | POA: Diagnosis not present

## 2019-12-11 DIAGNOSIS — M549 Dorsalgia, unspecified: Secondary | ICD-10-CM | POA: Diagnosis present

## 2019-12-11 DIAGNOSIS — M546 Pain in thoracic spine: Secondary | ICD-10-CM | POA: Diagnosis not present

## 2019-12-11 DIAGNOSIS — Z83438 Family history of other disorder of lipoprotein metabolism and other lipidemia: Secondary | ICD-10-CM

## 2019-12-11 DIAGNOSIS — J440 Chronic obstructive pulmonary disease with acute lower respiratory infection: Secondary | ICD-10-CM | POA: Diagnosis not present

## 2019-12-11 DIAGNOSIS — R918 Other nonspecific abnormal finding of lung field: Secondary | ICD-10-CM | POA: Diagnosis not present

## 2019-12-11 DIAGNOSIS — Z20822 Contact with and (suspected) exposure to covid-19: Secondary | ICD-10-CM | POA: Diagnosis present

## 2019-12-11 DIAGNOSIS — J9601 Acute respiratory failure with hypoxia: Secondary | ICD-10-CM | POA: Diagnosis not present

## 2019-12-11 DIAGNOSIS — M5127 Other intervertebral disc displacement, lumbosacral region: Secondary | ICD-10-CM | POA: Diagnosis not present

## 2019-12-11 DIAGNOSIS — Z8249 Family history of ischemic heart disease and other diseases of the circulatory system: Secondary | ICD-10-CM

## 2019-12-11 DIAGNOSIS — J189 Pneumonia, unspecified organism: Secondary | ICD-10-CM | POA: Diagnosis present

## 2019-12-11 DIAGNOSIS — G8929 Other chronic pain: Secondary | ICD-10-CM | POA: Diagnosis not present

## 2019-12-11 DIAGNOSIS — Z888 Allergy status to other drugs, medicaments and biological substances status: Secondary | ICD-10-CM

## 2019-12-11 DIAGNOSIS — M47816 Spondylosis without myelopathy or radiculopathy, lumbar region: Secondary | ICD-10-CM | POA: Diagnosis not present

## 2019-12-11 DIAGNOSIS — M436 Torticollis: Secondary | ICD-10-CM | POA: Diagnosis present

## 2019-12-11 LAB — URINALYSIS, ROUTINE W REFLEX MICROSCOPIC
Bacteria, UA: NONE SEEN
Bilirubin Urine: NEGATIVE
Glucose, UA: NEGATIVE mg/dL
Hgb urine dipstick: NEGATIVE
Ketones, ur: NEGATIVE mg/dL
Nitrite: NEGATIVE
Protein, ur: NEGATIVE mg/dL
Specific Gravity, Urine: 1.021 (ref 1.005–1.030)
pH: 5 (ref 5.0–8.0)

## 2019-12-11 LAB — LACTIC ACID, PLASMA
Lactic Acid, Venous: 3.4 mmol/L (ref 0.5–1.9)
Lactic Acid, Venous: 3.2 mmol/L (ref 0.5–1.9)
Lactic Acid, Venous: 4.5 mmol/L (ref 0.5–1.9)

## 2019-12-11 LAB — BASIC METABOLIC PANEL
Anion gap: 14 (ref 5–15)
BUN: 29 mg/dL — ABNORMAL HIGH (ref 8–23)
CO2: 20 mmol/L — ABNORMAL LOW (ref 22–32)
Calcium: 8.3 mg/dL — ABNORMAL LOW (ref 8.9–10.3)
Chloride: 95 mmol/L — ABNORMAL LOW (ref 98–111)
Creatinine, Ser: 1.04 mg/dL — ABNORMAL HIGH (ref 0.44–1.00)
GFR calc Af Amer: >60 mL/min (ref >60)
GFR calc non Af Amer: 55 mL/min — ABNORMAL LOW (ref >60)
Glucose, Bld: 121 mg/dL — ABNORMAL HIGH (ref 70–99)
Potassium: 4.5 mmol/L (ref 3.5–5.1)
Sodium: 129 mmol/L — ABNORMAL LOW (ref 135–145)

## 2019-12-11 LAB — TROPONIN I (HIGH SENSITIVITY)
Troponin I (High Sensitivity): 10 ng/L (ref <18)
Troponin I (High Sensitivity): 17 ng/L (ref <18)

## 2019-12-11 LAB — CBC
HCT: 36.2 % (ref 36.0–46.0)
Hemoglobin: 11.7 g/dL — ABNORMAL LOW (ref 12.0–15.0)
MCH: 26.7 pg (ref 26.0–34.0)
MCHC: 32.3 g/dL (ref 30.0–36.0)
MCV: 82.6 fL (ref 80.0–100.0)
Platelets: 419 10*3/uL — ABNORMAL HIGH (ref 150–400)
RBC: 4.38 MIL/uL (ref 3.87–5.11)
RDW: 16.5 % — ABNORMAL HIGH (ref 11.5–15.5)
WBC: 34.2 10*3/uL — ABNORMAL HIGH (ref 4.0–10.5)
nRBC: 0 % (ref 0.0–0.2)

## 2019-12-11 LAB — RESPIRATORY PANEL BY RT PCR (FLU A&B, COVID)
Influenza A by PCR: NEGATIVE
Influenza B by PCR: NEGATIVE
SARS Coronavirus 2 by RT PCR: NEGATIVE

## 2019-12-11 LAB — PROTIME-INR
INR: 1.2 (ref 0.8–1.2)
Prothrombin Time: 14.8 seconds (ref 11.4–15.2)

## 2019-12-11 LAB — APTT: aPTT: 37 seconds — ABNORMAL HIGH (ref 24–36)

## 2019-12-11 MED ORDER — HYDROMORPHONE HCL 1 MG/ML IJ SOLN
1.0000 mg | Freq: Once | INTRAMUSCULAR | Status: AC
  Administered 2019-12-11: 1 mg via INTRAVENOUS
  Filled 2019-12-11: qty 1

## 2019-12-11 MED ORDER — METRONIDAZOLE IN NACL 5-0.79 MG/ML-% IV SOLN
500.0000 mg | Freq: Once | INTRAVENOUS | Status: AC
  Administered 2019-12-11: 500 mg via INTRAVENOUS
  Filled 2019-12-11: qty 100

## 2019-12-11 MED ORDER — IOHEXOL 350 MG/ML SOLN
100.0000 mL | Freq: Once | INTRAVENOUS | Status: AC | PRN
  Administered 2019-12-11: 100 mL via INTRAVENOUS

## 2019-12-11 MED ORDER — SODIUM CHLORIDE 0.9% FLUSH: 3.0000 mL | Freq: Once | INTRAVENOUS | Status: DC

## 2019-12-11 MED ORDER — VANCOMYCIN HCL IN DEXTROSE 1-5 GM/200ML-% IV SOLN: 1000.0000 mg | Freq: Once | INTRAVENOUS | Status: DC

## 2019-12-11 MED ORDER — SODIUM CHLORIDE 0.9 % IV BOLUS
1000.0000 mL | Freq: Once | INTRAVENOUS | Status: AC
  Administered 2019-12-11: 1000 mL via INTRAVENOUS

## 2019-12-11 MED ORDER — HALOPERIDOL LACTATE 5 MG/ML IJ SOLN: 5.0000 mg | INTRAMUSCULAR | Status: AC

## 2019-12-11 MED ORDER — SODIUM CHLORIDE 0.9 % IV SOLN
2.0000 g | Freq: Two times a day (BID) | INTRAVENOUS | Status: DC
  Administered 2019-12-12: 05:00:00 2 g via INTRAVENOUS
  Filled 2019-12-11: qty 2

## 2019-12-11 MED ORDER — ONDANSETRON HCL 4 MG/2ML IJ SOLN
4.0000 mg | Freq: Once | INTRAMUSCULAR | Status: AC
  Administered 2019-12-11: 4 mg via INTRAVENOUS
  Filled 2019-12-11: qty 2

## 2019-12-11 MED ORDER — VANCOMYCIN HCL 1500 MG/300ML IV SOLN
1500.0000 mg | Freq: Once | INTRAVENOUS | Status: AC
  Administered 2019-12-11: 1500 mg via INTRAVENOUS
  Filled 2019-12-11: qty 300

## 2019-12-11 MED ORDER — SODIUM CHLORIDE 0.9 % IV SOLN: 1.0000 g | Freq: Three times a day (TID) | INTRAVENOUS | Status: DC

## 2019-12-11 MED ORDER — SODIUM CHLORIDE 0.9 % IV SOLN
2.0000 g | Freq: Once | INTRAVENOUS | Status: AC
  Administered 2019-12-11: 2 g via INTRAVENOUS
  Filled 2019-12-11: qty 2

## 2019-12-11 MED ORDER — HALOPERIDOL LACTATE 5 MG/ML IJ SOLN
INTRAMUSCULAR | Status: AC
  Administered 2019-12-11: 5 mg via INTRAVENOUS
  Filled 2019-12-11: qty 1

## 2019-12-11 NOTE — ED Provider Notes (Signed)
[facility] Emergency Department Provider Note  Time seen: 4:04 PM  I have reviewed the triage vital signs and the nursing notes.   HISTORY  Chief Complaint Back Pain  HPI Jordan Chan is a 69 y.o. female with a past medical history of arthritis, asthma, CHF, COPD, diabetes, hypertension, hyperlipidemia, chronic back pain  presents to the emergency department for significant back pain.  According to the patient for the past 3 days she has had significant pain from her mid upper back down through her lower back.  Patient states a history of chronic pain but this is much worse than normal and in different locations than normal.  Patient also states pain in the posterior chest.  Patient states she wears oxygen at night but was satting 72% on room air.  Denies feeling short of breath per patient.  Denies any anterior chest pain.  Denies any significant pleuritic pain.  No leg pain or swelling.  No history of blood clots no anticoagulation.  No fever.  Past Medical History:  Diagnosis Date  . Arthritis   . Asthma   . Breast cancer ([place]) 1998   right breast ca with mastectomy and chemotherapy and radiation  . Bronchitis   . CHF (congestive heart failure) ([place])    "with Morphine withdrawal"  . COPD (chronic obstructive pulmonary disease) ([place])   . Diabetes mellitus without complication ([place])   . Diverticulitis    diverticulosis also  . Dyspnea   . Endometriosis   . GERD (gastroesophageal reflux disease)   . History of shingles 2000-2005  . Hypercholesteremia   . Hypertension   . IBS (irritable bowel syndrome)   . Low back pain    a. Implanted morphine/bupivicaine/clonidine pump.  [name] Neuropathy   . Orthopnea   . Oxygen dependent    uses at night  . Personal history of chemotherapy   . Personal history of radiation therapy   . Pneumonia    pneumonia 5-6 times, history of bronchitis also  . Scoliosis   . Sleep apnea    does not use cpap  . Stroke ([place])  2010   TIA, 10 years ago  . Withdrawal from sedative drug ([place])    withdrawal from morphine when pump batteries died    Patient Active Problem List   Diagnosis Date Noted  . Angular cheilitis 11/30/2019  . Vitamin D deficiency 07/17/2019  . Stress-induced cardiomyopathy 07/23/2017  . Type 2 diabetes mellitus with diabetic neuropathy, unspecified ([place]) 06/24/2017  . Non-ST elevation (NSTEMI) myocardial infarction ([place])   . Presence of intrathecal pump 04/27/2017  . Advanced care planning/counseling discussion 11/14/2016  . Cervical scoliosis 05/20/2016  . Hypertension associated with diabetes ([place]) 06/18/2015  . B12 deficiency 03/19/2015  . Sleep apnea 11/16/2014  . Allergic rhinitis 11/16/2014  . Malignant neoplasm of right breast ([place]) 11/16/2014  . GERD (gastroesophageal reflux disease) 11/16/2014  . COPD, moderate ([place]) 11/16/2014  . Obesity 11/16/2014  . Depression, major, single episode, in partial remission ([place]) 11/16/2014  . Insomnia 11/16/2014  . Chronic back pain 11/16/2014  . Hyperlipidemia associated with type 2 diabetes mellitus ([place]) 11/16/2014  . 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 Chronic pain syndrome 09/30/2012    Past Surgical History:  Procedure Laterality Date  . ABDOMINAL HYSTERECTOMY  1987  . BACK SURGERY     Tailbone removed following fracture  . BREAST SURGERY Right    mastectomy  . CARDIAC CATHETERIZATION    . CATARACT EXTRACTION W/PHACO Right 05/19/2019  Procedure: CATARACT EXTRACTION PHACO AND INTRAOCULAR LENS PLACEMENT ([place]), RIGHT, DIABETIC;  Surgeon: Marchia Meiers, MD;  Location: [facility] ORS;  Service: Ophthalmology;  Laterality: Right;  Lot # [id number] H [place]: 00:43.8 CDE: 4.59  . CATARACT EXTRACTION W/PHACO Left 06/16/2019   Procedure: CATARACT EXTRACTION PHACO AND INTRAOCULAR LENS PLACEMENT ([place]) LEFT VISION BLUE DIABETIC;  Surgeon: Marchia Meiers, MD;  Location: [facility] ORS;  Service: Ophthalmology;  Laterality: Left;  Lot  [id number] H [place]: 00:46.9 CDE: 6.53  . COCCYX REMOVAL    . COLONOSCOPY WITH PROPOFOL N/A 01/01/2017   Procedure: COLONOSCOPY WITH PROPOFOL;  Surgeon: Jonathon Bellows, MD;  Location: [facility] ENDOSCOPY;  Service: Endoscopy;  Laterality: N/A;  . ELBOW ARTHROSCOPY WITH TENDON RECONSTRUCTION    . ESOPHAGOGASTRODUODENOSCOPY (EGD) WITH PROPOFOL N/A 01/01/2017   Procedure: ESOPHAGOGASTRODUODENOSCOPY (EGD) WITH PROPOFOL;  Surgeon: Jonathon Bellows, MD;  Location[facility]OSCOPY;  Service: Endoscopy;  Laterality: N/A;  . INTRATHECAL PUMP IMPLANT    . LEFT HEART CATH AND CORONARY ANGIOGRAPHY N/A 04/28/2017   Procedure: LEFT HEART CATH AND CORONARY ANGIOGRAPHY;  Surgeon: [name], MD;  Location: [place] CV LAB;  Service: Cardiovascular;  Laterality: N/A;  . MASTECTOMY Right 06/1997  . morphine pump  2011  . PLANTAR FASCIA RELEASE    . TRIGGER FINGER RELEASE      Prior to Admission medications   Medication Sig Start Date End Date Taking? Authorizing Provider  ACCU-CHEK AVIVA PLUS test strip TEST THREE TIMES DAILY 10/03/19   [name], Henrine Screws T, NP  Accu-Chek Softclix Lancets lancets TEST BLOOD SUGAR THREE TIMES DAILY 12/02/19   [name], Jolene T, NP  albuterol (PROVENTIL HFA;VENTOLIN HFA) 108 (90 BASE) MCG/ACT inhaler Inhale 2 puffs into the lungs every 6 (six) hours as needed for wheezing or shortness of breath.    [provider]  aspirin EC 81 MG tablet Take 81 mg by mouth daily.    [provider]  atorvastatin (LIPITOR) 20 MG tablet Take 1 tablet (20 mg total) by mouth daily. 06/01/19   [name], Henrine Screws T, NP  cholecalciferol (VITAMIN D3) 25 MCG (1000 UNIT) tablet Take 1,000 Units by mouth daily.    [provider]  clotrimazole (CLOTRIMAZOLE ANTI-FUNGAL) 1 % cream Apply 1 application topically 2 (two) times daily. 11/30/19   [name], Henrine Screws T, NP  cyclobenzaprine (FLEXERIL) 5 MG tablet Take 5 mg by mouth 2 (two) times daily as needed. 02/26/17   [provider]  diclofenac  (VOLTAREN) 50 MG EC tablet TAKE 1 TABLET (50 MG TOTAL) BY MOUTH 3 (THREE) TIMES DAILY. 02/19/19   [name], Jolene T, NP  Dulaglutide (TRULICITY) 1.5 0000000 SOPN INJECT 1.5MG  (1 PEN) SUBCUTANEOUSLY EVERY WEEK 05/20/19   [name], Jolene T, NP  gabapentin (NEURONTIN) 800 MG tablet Take 1 tablet (800 mg total) by mouth 3 (three) times daily. 05/17/19   [name], Henrine Screws T, NP  lisinopril (ZESTRIL) 5 MG tablet TAKE 1 TABLET EVERY DAY 10/14/19   [name], Henrine Screws T, NP  metFORMIN (GLUCOPHAGE) 1000 MG tablet TAKE 1 TABLET TWICE DAILY WITH MEALS 09/23/19   [name], Henrine Screws T, NP  metoprolol succinate (TOPROL-XL) 25 MG 24 hr tablet TAKE 1/2 TABLET EVERY DAY 10/14/19   [name], Jolene T, NP  pantoprazole (PROTONIX) 40 MG tablet TAKE 1 TABLET TWICE DAILY 08/31/19   [name], Jolene T, NP  predniSONE (DELTASONE) 20 MG tablet Take 2 tablets (40 mg total) by mouth daily with breakfast for 5 days. 12/07/19 12/12/19  [name], Henrine Screws T, NP  TRELEGY ELLIPTA 100-62.5-25 MCG/INH AEPB INHALE 1 PUFF INTO THE  LUNGS DAILY. 11/24/19   [name], Henrine Screws T, NP  TRESIBA FLEXTOUCH 100 UNIT/ML FlexTouch Pen INJECT 30 UNITS INTO THE SKIN AT BEDTIME. 10/07/19   [name], Jolene T, NP  triamcinolone ointment (KENALOG) 0.1 % Apply 1 application topically 2 (two) times daily. 12/07/19   [name], Henrine Screws T, NP  venlafaxine XR (EFFEXOR-XR) 150 MG 24 hr capsule Take 1 capsule (150 mg total) by mouth daily. 07/19/19   [name], NP    Allergies  Allergen Reactions  . Other Palpitations    IV steroids  . Pain Patch [Menthol] Anaphylaxis  . Fentanyl   . Avelox [Moxifloxacin Hcl In Nacl] Other (See Comments)    Upset stomach  . Doxycycline Diarrhea  . Erythromycin Nausea Only and Other (See Comments)    Can take a Z-Pak just fine Other reaction(s): Other (See Comments) Can take Z-Pak  Can take a Z-Pak just fine  . Moxifloxacin Hcl Other (See Comments)    Upset stomach Upset stomach  . Oxycontin [Oxycodone] Hives  . Ozempic [Semaglutide]  Nausea Only    Family History  Problem Relation Age of Onset  . Cancer Mother 26       lung  . Thyroid disease Mother   . Cancer Father 67       Pancreatic  . Hypertension Sister   . Cancer Sister        "cancer on face"  . Hyperlipidemia Sister   . Osteoporosis Maternal Grandmother   . Hyperlipidemia Son   . Seizures Son   . Cancer Paternal Grandmother        colon  . Arthritis Paternal Grandfather   . Breast cancer Neg Hx     Social History Social History   Tobacco Use  . Smoking status: Former Smoker    Packs/day: 1.00    Years: 30.00    Pack years: 30.00    Types: Cigarettes    Quit date: 04/30/2004    Years since quitting: 15.6  . Smokeless tobacco: Never Used  Substance Use Topics  . Alcohol use: No  . Drug use: No    Review of Systems Constitutional: Negative for fever. Cardiovascular: Negative for chest pain. Respiratory: Negative for shortness of breath. Gastrointestinal: Negative for abdominal pain.  Positive for nausea vomiting.  1 episode of diarrhea. Genitourinary: Denies urinary symptoms. Musculoskeletal: Severe back pain. Neurological: Negative for headache All other ROS negative  ____________________________________________   PHYSICAL EXAM:  VITAL SIGNS: ED Triage Vitals  Enc Vitals Group     BP 12/11/19 1430 (!) 120/52     Pulse --      Resp 12/11/19 1430 (!) 24     Temp 12/11/19 1410 98.2 F (36.8 C)     Temp Source 12/11/19 1410 Oral     SpO2 --      Weight 12/11/19 1411 142 lb (64.4 kg)     Height 12/11/19 1411 4\' 10"  (1.473 m)     Head Circumference --      Peak Flow --      Pain Score 12/11/19 1411 10     Pain Loc --      Pain Edu? --      Excl. in [place]? --     Constitutional: Alert and oriented.  Mild distress due to pain. Eyes: Normal exam ENT      Head: Normocephalic and atraumatic.      Mouth/Throat: Mucous membranes are moist. Cardiovascular: Normal rate, regular rhythm Respiratory: Normal respiratory effort  without tachypnea nor  retractions. Breath sounds are clear Gastrointestinal: Soft and nontender. No distention.   Musculoskeletal: Nontender with normal range of motion in all extremities. Neurologic:  Normal speech and language. No gross focal neurologic deficits Skin:  Skin is warm, dry and intact.  Psychiatric: Mood and affect are normal.   ____________________________________________    EKG  EKG viewed and interpreted by myself shows a sinus tachycardia 117 bpm with a narrow QRS, normal axis, normal intervals, nonspecific ST changes.  ____________________________________________    RADIOLOGY  X-ray shows patchy opacification of the right lung base.  Possible infiltrate.  ____________________________________________   INITIAL IMPRESSION / ASSESSMENT AND PLAN / ED COURSE  Pertinent labs & imaging results that were available during my care of the patient were reviewed by me and considered in my medical decision making (see chart for details).   Patient presents emergency department for back pain from her mid upper back down to her lower back.  History of chronic back pain but states this is worse and different.  Patient found to be hypoxic 72% on room air.  Denies any known fever.  Patient's labs are resulted showing a significant leukocytosis of 34,000.  Patient is also quite tachycardic meeting sepsis protocols.  We will start the patient on broad-spectrum antibiotics.  Given the patient's back pain radiating all the way down into her lower back we will obtain a CT dissection protocol.  We will start the patient on pain medication, IV fluids, antibiotics.  Plan to admit to the hospital service once her work-up is been completed.  Patient CTA is negative for dissection or PE.  Does show pneumonia.  I have sent a Covid test.  We are covering with broad-spectrum antibiotics.  We will admit to the hospitalist service for sepsis and pneumonia.    Jordan Chan was evaluated in  Emergency Department on 12/11/2019 for the symptoms described in the history of present illness. She was evaluated in the context of the global COVID-19 pandemic, which necessitated consideration that the patient might be at risk for infection with the SARS-CoV-2 virus that causes COVID-19. Institutional protocols and algorithms that pertain to the evaluation of patients at risk for COVID-19 are in a state of rapid change based on information released by regulatory bodies including the CDC and federal and state organizations. These policies and algorithms were followed during the patient's care in the ED.  CRITICAL CARE Performed by: Harvest Dark   Total critical care time: 30 minutes  Critical care time was exclusive of separately billable procedures and treating other patients.  Critical care was necessary to treat or prevent imminent or life-threatening deterioration.  Critical care was time spent personally by me on the following activities: development of treatment plan with patient and/or surrogate as well as nursing, discussions with consultants, evaluation of patient's response to treatment, examination of patient, obtaining history from patient or surrogate, ordering and performing treatments and interventions, ordering and review of laboratory studies, ordering and review of radiographic studies, pulse oximetry and re-evaluation of patient's condition.  ____________________________________________   FINAL CLINICAL IMPRESSION(S) / ED DIAGNOSES  Back pain Sepsis Pneumonia   Harvest Dark, MD 12/11/19 [id number]

## 2019-12-11 NOTE — ED Notes (Signed)
PT taken to CT.

## 2019-12-11 NOTE — Progress Notes (Signed)
Notified bedside nurse of need to administer antibiotics.  

## 2019-12-11 NOTE — H&P (Signed)
History and Physical   Jordan Chan [id number] DOB: 1951/07/11 DOA: 12/11/2019  Referring MD/NP/PA: Dr. Kerman Passey  PCP: [name], NP   Outpatient Specialists: None  Patient coming from: Home  Chief Complaint: Back pain  HPI: Jordan Chan is a 69 y.o. female with medical history significant of Diabetes, osteoarthritis, history of breast cancer with mastectomy and chemotherapy, asthma, COPD, chronic back pain, hyperlipidemia and chronic pain syndrome presented to the ER initially with significant back pain.  She has chronic back pain but this seemed to be out of tune with her typical back pain.  Symptoms have been going on for 3 days.  And this is moving to her back.  Rated as 10 out of 10 at some point.  Mainly in the chest wall bilaterally.  She also noted her oxygen dropping at night.  She was short of breath on arrival and was found to be hypoxic in the ER.  Denied any fever or chills.  Patient was evaluated and by imaging was found to have pneumonia.  It appears her chest pain was pleuritic in nature.  She is being admitted to the hospital with sepsis based on the SIRS and infiltrates.  Patient has suddenly deteriorated in the ER with worsening hypoxia tachypnea and is therefore being admitted to stepdown unit.   .  ED Course: Temperature 100.7, blood pressure 166/67 pulse 139 respirate of 32 oxygen sat 86% on room air.  White count of 34.2 hemoglobin 11.9 platelets 419.  Sodium 129 potassium 4.4 chloride 95 CO2 20 BUN 20 creatinine 1.04 and calcium 8.3.  Lactic acid 4.5 and glucose 121.  Urinalysis essentially negative COVID-19 is negative.  She has moderate leukocytes with WBC 21-50 but no bacteria.  Chest x-ray showed mild patchy of aspiration of the posterior aspect of the right lung base.  CT angiogram of the chest shows no PE but consolidation of the right lower lobe concerning for pneumonia.  Also patchy as per consultation in the superior segment of the left lower  lobe.  Stable right apical scaring.  Patient is therefore being admitted with sepsis due to multilobar pneumonia.  Review of Systems: As per HPI otherwise 10 point review of systems negative.    Past Medical History:  Diagnosis Date  . Arthritis   . Asthma   . Breast cancer ([place]) 1998   right breast ca with mastectomy and chemotherapy and radiation  . Bronchitis   . CHF (congestive heart failure) ([place])    "with Morphine withdrawal"  . COPD (chronic obstructive pulmonary disease) ([place])   . Diabetes mellitus without complication ([place])   . Diverticulitis    diverticulosis also  . Dyspnea   . Endometriosis   . GERD (gastroesophageal reflux disease)   . History of shingles 2000-2005  . Hypercholesteremia   . Hypertension   . IBS (irritable bowel syndrome)   . Low back pain    a. Implanted morphine/bupivicaine/clonidine pump.  [name] Neuropathy   . Orthopnea   . Oxygen dependent    uses at night  . Personal history of chemotherapy   . Personal history of radiation therapy   . Pneumonia    pneumonia 5-6 times, history of bronchitis also  . Scoliosis   . Sleep apnea    does not use cpap  . Stroke ([place]) 2010   TIA, 10 years ago  . Withdrawal from sedative drug ([place])    withdrawal from morphine when pump batteries died    Past Surgical History:  Procedure Laterality Date  . ABDOMINAL HYSTERECTOMY  1987  . BACK SURGERY     Tailbone removed following fracture  . BREAST SURGERY Right    mastectomy  . CARDIAC CATHETERIZATION    . CATARACT EXTRACTION W/PHACO Right 05/19/2019   Procedure: CATARACT EXTRACTION PHACO AND INTRAOCULAR LENS PLACEMENT ([place]), RIGHT, DIABETIC;  Surgeon: Marchia Meiers, MD;  Location: [facility] ORS;  Service: Ophthalmology;  Laterality: Right;  Lot # [id number] H [place]: 00:43.8 CDE: 4.59  . CATARACT EXTRACTION W/PHACO Left 06/16/2019   Procedure: CATARACT EXTRACTION PHACO AND INTRAOCULAR LENS PLACEMENT ([place]) LEFT VISION BLUE DIABETIC;  Surgeon: Marchia Meiers, MD;   Location: [facility] ORS;  Service: Ophthalmology;  Laterality: Left;  Lot [id number] H [place]: 00:46.9 CDE: 6.53  . COCCYX REMOVAL    . COLONOSCOPY WITH PROPOFOL N/A 01/01/2017   Procedure: COLONOSCOPY WITH PROPOFOL;  Surgeon: Jonathon Bellows, MD;  Location[facility] ENDOSCOPY;  Service: Endoscopy;  Laterality: N/A;  . ELBOW ARTHROSCOPY WITH TENDON RECONSTRUCTION    . ESOPHAGOGASTRODUODENOSCOPY (EGD) WITH PROPOFOL N/A 01/01/2017   Procedure: ESOPHAGOGASTRODUODENOSCOPY (EGD) WITH PROPOFOL;  Surgeon: Jonathon Bellows, MD;  Location: [facility] ENDOSCOPY;  Service: Endoscopy;  Laterality: N/A;  . INTRATHECAL PUMP IMPLANT    . LEFT HEART CATH AND CORONARY ANGIOGRAPHY N/A 04/28/2017   Procedure: LEFT HEART CATH AND CORONARY ANGIOGRAPHY;  Surgeon: [name], MD;  Location: [facility] CV LAB;  Service: Cardiovascular;  Laterality: N/A;  . MASTECTOMY Right 06/1997  . morphine pump  2011  . PLANTAR FASCIA RELEASE    . TRIGGER FINGER RELEASE       reports that she quit smoking about 15 years ago. Her smoking use included cigarettes. She has a 30.00 pack-year smoking history. She has never used smokeless tobacco. She reports that she does not drink alcohol or use drugs.  Allergies  Allergen Reactions  . Other Palpitations    IV steroids  . Pain Patch [Menthol] Anaphylaxis  . Fentanyl   . Avelox [Moxifloxacin Hcl In Nacl] Other (See Comments)    Upset stomach  . Doxycycline Diarrhea  . Erythromycin Nausea Only and Other (See Comments)    Can take a Z-Pak just fine Other reaction(s): Other (See Comments) Can take Z-Pak  Can take a Z-Pak just fine  . Moxifloxacin Hcl Other (See Comments)    Upset stomach Upset stomach  . Oxycontin [Oxycodone] Hives  . Ozempic [Semaglutide] Nausea Only    Family History  Problem Relation Age of Onset  . Cancer Mother 46       lung  . Thyroid disease Mother   . Cancer Father 58       Pancreatic  . Hypertension Sister   . Cancer Sister        "cancer on face"  .  Hyperlipidemia Sister   . Osteoporosis Maternal Grandmother   . Hyperlipidemia Son   . Seizures Son   . Cancer Paternal Grandmother        colon  . Arthritis Paternal Grandfather   . Breast cancer Neg Hx      Prior to Admission medications   Medication Sig Start Date End Date Taking? Authorizing Provider  ACCU-CHEK AVIVA PLUS test strip TEST THREE TIMES DAILY 10/03/19   [name], Henrine Screws T, NP  Accu-Chek Softclix Lancets lancets TEST BLOOD SUGAR THREE TIMES DAILY 12/02/19   [name], Jolene T, NP  albuterol (PROVENTIL HFA;VENTOLIN HFA) 108 (90 BASE) MCG/ACT inhaler Inhale 2 puffs into the lungs every 6 (six) hours as needed for wheezing or shortness of breath.  [provider]  aspirin EC 81 MG tablet Take 81 mg by mouth daily.    [provider]  atorvastatin (LIPITOR) 20 MG tablet Take 1 tablet (20 mg total) by mouth daily. 06/01/19   [name], Henrine Screws T, NP  cholecalciferol (VITAMIN D3) 25 MCG (1000 UNIT) tablet Take 1,000 Units by mouth daily.    [provider]  clotrimazole (CLOTRIMAZOLE ANTI-FUNGAL) 1 % cream Apply 1 application topically 2 (two) times daily. 11/30/19   [name], Henrine Screws T, NP  cyclobenzaprine (FLEXERIL) 5 MG tablet Take 5 mg by mouth 2 (two) times daily as needed. 02/26/17   [provider]  diclofenac (VOLTAREN) 50 MG EC tablet TAKE 1 TABLET (50 MG TOTAL) BY MOUTH 3 (THREE) TIMES DAILY. 02/19/19   [name], Jolene T, NP  Dulaglutide (TRULICITY) 1.5 0000000 SOPN INJECT 1.5MG  (1 PEN) SUBCUTANEOUSLY EVERY WEEK 05/20/19   [name], Jolene T, NP  gabapentin (NEURONTIN) 800 MG tablet Take 1 tablet (800 mg total) by mouth 3 (three) times daily. 05/17/19   [name], Henrine Screws T, NP  lisinopril (ZESTRIL) 5 MG tablet TAKE 1 TABLET EVERY DAY 10/14/19   [name], Henrine Screws T, NP  metFORMIN (GLUCOPHAGE) 1000 MG tablet TAKE 1 TABLET TWICE DAILY WITH MEALS 09/23/19   [name], Henrine Screws T, NP  metoprolol succinate (TOPROL-XL) 25 MG 24 hr tablet TAKE 1/2 TABLET EVERY DAY  10/14/19   [name], Jolene T, NP  pantoprazole (PROTONIX) 40 MG tablet TAKE 1 TABLET TWICE DAILY 08/31/19   [name], Jolene T, NP  predniSONE (DELTASONE) 20 MG tablet Take 2 tablets (40 mg total) by mouth daily with breakfast for 5 days. 12/07/19 12/12/19  [name], Jolene T, NP  TRELEGY ELLIPTA 100-62.5-25 MCG/INH AEPB INHALE 1 PUFF INTO THE LUNGS DAILY. 11/24/19   [name], Henrine Screws T, NP  TRESIBA FLEXTOUCH 100 UNIT/ML FlexTouch Pen INJECT 30 UNITS INTO THE SKIN AT BEDTIME. 10/07/19   [name], Jolene T, NP  triamcinolone ointment (KENALOG) 0.1 % Apply 1 application topically 2 (two) times daily. 12/07/19   [name], Henrine Screws T, NP  venlafaxine XR (EFFEXOR-XR) 150 MG 24 hr capsule Take 1 capsule (150 mg total) by mouth daily. 07/19/19   [name] T, NP    Physical Exam: Vitals:   12/11/19 1411 12/11/19 1430 12/11/19 1600 12/11/19 1813  BP:  (!) 120/52 109/60 (!) 143/69  Pulse:    (!) 133  Resp:  (!) 24 (!) 25 (!) 32  Temp:      TempSrc:      SpO2:    95%  Weight: 64.4 kg     Height: 4\' 10"  (1.473 m)         Constitutional: Acutely ill looking moving in bed Vitals:   12/11/19 1411 12/11/19 1430 12/11/19 1600 12/11/19 1813  BP:  (!) 120/52 109/60 (!) 143/69  Pulse:    (!) 133  Resp:  (!) 24 (!) 25 (!) 32  Temp:      TempSrc:      SpO2:    95%  Weight: 64.4 kg     Height: 4\' 10"  (1.473 m)      Eyes: PERRL, lids and conjunctivae normal ENMT: Mucous membranes are dry. Posterior pharynx clear of any exudate or lesions.Normal dentition.  Neck: normal, supple, no masses, no thyromegaly Respiratory: Tachypneic, decreased air entry bilaterally, rhonchi's and some mild wheezings Cardiovascular: Sinus tachycardia, no murmurs / rubs / gallops. No extremity edema. 2+ pedal pulses. No carotid bruits.  Abdomen: no tenderness, no masses palpated. No hepatosplenomegaly. Bowel sounds positive.  Musculoskeletal: no clubbing / cyanosis. No joint deformity upper and lower extremities. Good ROM, no  contractures. Normal muscle tone.  Skin: no rashes, lesions, ulcers. No induration Neurologic: CN 2-12 grossly intact. Sensation intact, DTR normal. Strength 5/5 in all 4.  Psychiatric: Very anxious and in pain    Labs on Admission: I have personally reviewed following labs and imaging studies  CBC: Recent Labs  Lab 12/11/19 1405  WBC 34.2*  HGB 11.7*  HCT 36.2  MCV 82.6  PLT 123XX123*   Basic Metabolic Panel: Recent Labs  Lab 12/11/19 1405  NA 129*  K 4.5  CL 95*  CO2 20*  GLUCOSE 121*  BUN 29*  CREATININE 1.04*  CALCIUM 8.3*   GFR: Estimated Creatinine Clearance: 40.5 mL/min (A) (by C-G formula based on SCr of 1.04 mg/dL (H)). Liver Function Tests: No results for input(s): AST, ALT, ALKPHOS, BILITOT, PROT, ALBUMIN in the last 168 hours. No results for input(s): LIPASE, AMYLASE in the last 168 hours. No results for input(s): AMMONIA in the last 168 hours. Coagulation Profile: Recent Labs  Lab 12/11/19 1405  INR 1.2   Cardiac Enzymes: No results for input(s): CKTOTAL, CKMB, CKMBINDEX, TROPONINI in the last 168 hours. BNP (last 3 results) No results for input(s): PROBNP in the last 8760 hours. HbA1C: No results for input(s): HGBA1C in the last 72 hours. CBG: No results for input(s): GLUCAP in the last 168 hours. Lipid Profile: No results for input(s): CHOL, HDL, LDLCALC, TRIG, CHOLHDL, LDLDIRECT in the last 72 hours. Thyroid Function Tests: No results for input(s): TSH, T4TOTAL, FREET4, T3FREE, THYROIDAB in the last 72 hours. Anemia Panel: No results for input(s): VITAMINB12, FOLATE, FERRITIN, TIBC, IRON, RETICCTPCT in the last 72 hours. Urine analysis:    Component Value Date/Time   COLORURINE YELLOW (A) 05/01/2017 0013   APPEARANCEUR Clear 08/01/2019 1038   LABSPEC 1.008 05/01/2017 0013   PHURINE 6.0 05/01/2017 0013   GLUCOSEU Negative 08/01/2019 1038   HGBUR NEGATIVE 05/01/2017 0013   BILIRUBINUR Negative 08/01/2019 1038   KETONESUR NEGATIVE 05/01/2017  0013   PROTEINUR Negative 08/01/2019 1038   PROTEINUR NEGATIVE 05/01/2017 0013   NITRITE Negative 08/01/2019 1038   NITRITE POSITIVE (A) 05/01/2017 0013   LEUKOCYTESUR Negative 08/01/2019 1038   Sepsis Labs: @LABRCNTIP (procalcitonin:4,lacticidven:4) )No results found for this or any previous visit (from the past 240 hour(s)).   Radiological Exams on Admission: DG Chest 2 View  Result Date: 12/11/2019 CLINICAL DATA:  Back pain. EXAM: CHEST - 2 VIEW COMPARISON:  June 01, 2017 FINDINGS: Mild, chronic appearing increased lung markings are seen. Mild patchy opacification of the posterior aspect of the right lung base is noted. There is mild blunting of the right costophrenic angle. No pneumothorax is identified. The heart size and mediastinal contours are within normal limits. A chronic seventh right rib fracture is noted. IMPRESSION: 1. Mild patchy opacification of the posterior aspect of the right lung base likely consistent with atelectasis and/or infiltrate. Follow-up to resolution is recommended to exclude the presence of an underlying neoplastic process. 2. Mild blunting of the right costophrenic angle consistent with small pleural effusion. Electronically Signed   By: Virgina Norfolk M.D.   On: 12/11/2019 15:55   CT Angio Chest/Abd/Pel for Dissection W and/or Wo Contrast  Result Date: 12/11/2019 CLINICAL DATA:  Chest pain. EXAM: CT ANGIOGRAPHY CHEST, ABDOMEN AND PELVIS TECHNIQUE: Non-contrast CT of the chest was initially obtained. Multidetector CT imaging through the chest, abdomen and pelvis was performed using the standard protocol during bolus  administration of intravenous contrast. Multiplanar reconstructed images and MIPs were obtained and reviewed to evaluate the vascular anatomy. CONTRAST:  184mL OMNIPAQUE IOHEXOL 350 MG/ML SOLN COMPARISON:  June 01, 2017 FINDINGS: CTA CHEST FINDINGS Cardiovascular: Satisfactory opacification of the pulmonary arteries to the segmental level. No  evidence of pulmonary embolism. Normal heart size. No pericardial effusion. Mediastinum/Nodes: No enlarged mediastinal, hilar, or axillary lymph nodes. The trachea, and esophagus demonstrate no significant findings. Stable appearance of the thyroid gland. Lungs/Pleura: Stable right apical pleuroparenchymal scarring with dystrophic calcifications. Confluent consolidation in the right lower lobe. Patchy airspace consolidation versus 1.8 cm pulmonary nodule in the superior segment of the left lower lobe. Stable 3 mm subpleural pulmonary nodule in the posterior aspect of the superior segment of the left lower lobe. Musculoskeletal: No chest wall abnormality. No acute or significant osseous findings. Review of the MIP images confirms the above findings. CTA ABDOMEN AND PELVIS FINDINGS VASCULAR Aorta: Normal caliber aorta without aneurysm, dissection, vasculitis or significant stenosis. Celiac: Patent without evidence of aneurysm, dissection, vasculitis or significant stenosis. SMA: Patent without evidence of aneurysm, dissection, vasculitis or significant stenosis. Renals: Both renal arteries are patent without evidence of aneurysm, dissection, vasculitis, fibromuscular dysplasia or significant stenosis. IMA: Patent without evidence of aneurysm, dissection, vasculitis or significant stenosis. Inflow: Patent without evidence of aneurysm, dissection, vasculitis or significant stenosis. Veins: No obvious venous abnormality within the limitations of this arterial phase study. Review of the MIP images confirms the above findings. NON-VASCULAR Hepatobiliary: No focal liver abnormality is seen. No gallstones, gallbladder wall thickening, or biliary dilatation. Pancreas: Unremarkable. No pancreatic ductal dilatation or surrounding inflammatory changes. Spleen: Normal in size without focal abnormality. Adrenals/Urinary Tract: Normal adrenal glands. Bilateral benign-appearing renal cysts. The largest partially exophytic right  renal cyst measures 3.6 cm. Normal appearance of the urinary bladder. Stomach/Bowel: Stomach is within normal limits. No evidence of bowel wall thickening, distention, or inflammatory changes. Lymphatic: No evidence of lymphadenopathy. Calcific atherosclerotic disease of the aorta and iliac arteries, mild. Reproductive: Status post hysterectomy. No adnexal masses. Other: No abdominal wall hernia or abnormality. No abdominopelvic ascites. Medicine pump in the right anterior abdominal wall. Musculoskeletal: No acute or significant osseous findings. Review of the MIP images confirms the above findings. IMPRESSION: 1. No evidence of pulmonary embolus or acute aortic syndrome. 2. Confluent consolidation in the right lower lobe concerning for pneumonia. 3. Patchy airspace consolidation versus 1.8 cm pulmonary nodule in the superior segment of the left lower lobe. Follow-up after empiric treatment is recommended. 4. Stable right apical pleuroparenchymal scarring with dystrophic calcifications. 5. No evidence of acute abnormalities within the solid abdominal organs. 6. Bilateral benign-appearing renal cysts. 7. Mild calcific atherosclerotic disease of the aorta and iliac arteries. Aortic Atherosclerosis (ICD10-I70.0). Electronically Signed   By: Fidela Salisbury M.D.   On: 12/11/2019 17:43    EKG: Independently reviewed.  Sinus tachycardia without obvious ST changes  Assessment/Plan Principal Problem:   Sepsis (Register) Active Problems:   Malignant neoplasm of right breast (HCC)   GERD (gastroesophageal reflux disease)   COPD, moderate (HCC)   Hyperlipidemia associated with type 2 diabetes mellitus (HCC)   Chronic pain syndrome   Presence of intrathecal pump   Community acquired pneumonia     #1 sepsis secondary to pneumonia: Patient will be admitted to stepdown unit.  Will initiate sepsis protocol with vancomycin cefepime and metronidazole.  Obtain blood and sputum cultures.  Fluid resuscitation and  supportive care.  #2  Pneumonia: Will be treated as community-acquired.  #  3 COPD: Mild exacerbation based on presentation.  Will consider steroids if symptoms do not improve.  #4 chronic pain syndrome: Patient has pain pump in place.  It is implanted.  Continue management.  #5 diabetes: Continue with sliding scale insulin and home regimen  #6 GERD: Continue with PPI  #7 history of breast cancer: Continue per oncology   DVT prophylaxis: Lovenox Code Status: Full code Family Communication: Husband at bedside Disposition Plan: To be determined Consults called: None Admission status: Inpatient to stepdown  Severity of Illness: The appropriate patient status for this patient is INPATIENT. Inpatient status is judged to be reasonable and necessary in order to provide the required intensity of service to ensure the patient's safety. The patient's presenting symptoms, physical exam findings, and initial radiographic and laboratory data in the context of their chronic comorbidities is felt to place them at high risk for further clinical deterioration. Furthermore, it is not anticipated that the patient will be medically stable for discharge from the hospital within 2 midnights of admission. The following factors support the patient status of inpatient.   " The patient's presenting symptoms include upper back pain with shortness of breath. " The worrisome physical exam findings include tachypnea with hypoxemia. " The initial radiographic and laboratory data are worrisome because of evidence of sepsis. " The chronic co-morbidities include chronic pain syndrome.   * I certify that at the point of admission it is my clinical judgment that the patient will require inpatient hospital care spanning beyond 2 midnights from the point of admission due to high intensity of service, high risk for further deterioration and high frequency of surveillance required.Barbette Merino MD [facility] Pager [phone]  If 7PM-7AM, please contact night-coverage [email or web address] Password [email or web address]  12/11/2019, 6:50 PM

## 2019-12-11 NOTE — ED Notes (Signed)
admitting Provider at bedside. 

## 2019-12-11 NOTE — ED Notes (Signed)
This RN heard pt yelling from room. Pt stated she needed to urinate. Placed on bedpan d/t elevated HR. This RN did not want pt getting up. Pt appears anxious, has removed pulse ox. Placed pulse ox back on pt. Pt keeps asking for help. Informed pt that this RN is helping her urinate. Pt able to lift hips and was placed on bedpan. Pt was cleaned with wipes. Urine collected and sent to lab.

## 2019-12-11 NOTE — Progress Notes (Signed)
Pharmacy Antibiotic Note  Jordan Chan is a 69 y.o. female admitted on 12/11/2019 with sepsis.  Pharmacy has been consulted for Cefepime dosing.  Plan: Cefepime 2 gm IV X 1 on 5/09 @ 1700. Cefepime 2 gm IV Q12H to start on 5/10 @ 0500.   Height: 4\' 10"  (147.3 cm) Weight: 64.4 kg (142 lb) IBW/kg (Calculated) : 40.9  Temp (24hrs), Avg:98.2 F (36.8 C), Min:98.2 F (36.8 C), Max:98.2 F (36.8 C)  Recent Labs  Lab 12/11/19 1405  WBC 34.2*  CREATININE 1.04*  LATICACIDVEN 3.4*    Estimated Creatinine Clearance: 40.5 mL/min (A) (by C-G formula based on SCr of 1.04 mg/dL (H)).    Allergies  Allergen Reactions  . Other Palpitations    IV steroids  . Pain Patch [Menthol] Anaphylaxis  . Fentanyl   . Avelox [Moxifloxacin Hcl In Nacl] Other (See Comments)    Upset stomach  . Doxycycline Diarrhea  . Erythromycin Nausea Only and Other (See Comments)    Can take a Z-Pak just fine Other reaction(s): Other (See Comments) Can take Z-Pak  Can take a Z-Pak just fine  . Moxifloxacin Hcl Other (See Comments)    Upset stomach Upset stomach  . Oxycontin [Oxycodone] Hives  . Ozempic [Semaglutide] Nausea Only    Antimicrobials this admission:   >>    >>   Dose adjustments this admission:   Microbiology results:  BCx:   UCx:   Sputum:    MRSA PCR:   Thank you for allowing pharmacy to be a part of this patient's care.  Oran Dillenburg D 12/11/2019 6:58 PM

## 2019-12-11 NOTE — ED Notes (Signed)
Pt does have an implanted morphine pain pump. Pt does wear O2 2L every night at home

## 2019-12-11 NOTE — ED Triage Notes (Signed)
Pt presents via acems with c/o back pain that radiates up to shoulder blades. Symptoms began 3 days ago. Pt chronically has lower back pain and has implanted morphine pump for pain control. Pt states pain does not normally radiate up through shoulder blades like this. Pt also c/o N/V. When ems arrived pt was 79% on room air. Pt has hx of copd but only wears oxygen at night. Pt placed on 3L which brought saturation up to 94%.

## 2019-12-11 NOTE — Progress Notes (Signed)
CODE SEPSIS - PHARMACY COMMUNICATION  **Broad Spectrum Antibiotics should be administered within 1 hour of Sepsis diagnosis**  Time Code Sepsis Called/Page Received: 5/9 @ 1600   Antibiotics Ordered: Vanc, Cefepime   Time of 1st antibiotic administration:  Cefepime 2 gm @ 1657  Additional action taken by pharmacy: none  If necessary, Name of Provider/Nurse Contacted:     Montario Zilka D ,PharmD Clinical Pharmacist  12/11/2019  5:05 PM

## 2019-12-11 NOTE — Progress Notes (Signed)
PHARMACY -  BRIEF ANTIBIOTIC NOTE   Pharmacy has received consult(s) for Cefepime, Vanc from an ED provider.  The patient's profile has been reviewed for ht/wt/allergies/indication/available labs.    One time order(s) placed for Vancomycin 1500 mg IV X 1 and Cefepime 2 gm IV X 1  Further antibiotics/pharmacy consults should be ordered by admitting physician if indicated.                       Thank you, Kamil Hanigan D 12/11/2019  4:08 PM

## 2019-12-11 NOTE — Progress Notes (Signed)
Notified bedside nurse of need to draw repeat lactic acid. 

## 2019-12-11 NOTE — ED Notes (Signed)
Rachael Fee NP coming to assess pt. Status change requires new bed assignment. Pt aware.

## 2019-12-11 NOTE — ED Notes (Signed)
To room 18 for lab draw.  Pt very agitated.  Yelling out and thrashing around in bed needing pain meds.  Was able to obtain labs.  Pt yelling that's shes "freezing" yet kicks off all her blankets.  Pt states she is both "hot and cold".  Pt trying to get out of bed to "move around" and pt was told she cannot get out of bed due to her heart rate.  She stated she needed that bathroom so I assisted her onto a bedpan.  Pt continues to yell out.

## 2019-12-11 NOTE — ED Notes (Signed)
Attempted to draw cultures, unsuccessful. Another Rn attempting at this time

## 2019-12-12 ENCOUNTER — Other Ambulatory Visit: Payer: Self-pay | Admitting: Nurse Practitioner

## 2019-12-12 ENCOUNTER — Encounter: Payer: Self-pay | Admitting: Internal Medicine

## 2019-12-12 DIAGNOSIS — A419 Sepsis, unspecified organism: Secondary | ICD-10-CM

## 2019-12-12 LAB — CBC WITH DIFFERENTIAL/PLATELET
Abs Immature Granulocytes: 2.39 10*3/uL — ABNORMAL HIGH (ref 0.00–0.07)
Basophils Absolute: 0.2 10*3/uL — ABNORMAL HIGH (ref 0.0–0.1)
Basophils Relative: 1 %
Eosinophils Absolute: 0.2 10*3/uL (ref 0.0–0.5)
Eosinophils Relative: 1 %
HCT: 33.5 % — ABNORMAL LOW (ref 36.0–46.0)
Hemoglobin: 10.7 g/dL — ABNORMAL LOW (ref 12.0–15.0)
Immature Granulocytes: 9 %
Lymphocytes Relative: 5 %
Lymphs Abs: 1.4 10*3/uL (ref 0.7–4.0)
MCH: 26.7 pg (ref 26.0–34.0)
MCHC: 31.9 g/dL (ref 30.0–36.0)
MCV: 83.5 fL (ref 80.0–100.0)
Monocytes Absolute: 0.4 10*3/uL (ref 0.1–1.0)
Monocytes Relative: 2 %
Neutro Abs: 22.8 10*3/uL — ABNORMAL HIGH (ref 1.7–7.7)
Neutrophils Relative %: 82 %
Platelets: 459 10*3/uL — ABNORMAL HIGH (ref 150–400)
RBC: 4.01 MIL/uL (ref 3.87–5.11)
RDW: 16.3 % — ABNORMAL HIGH (ref 11.5–15.5)
WBC: 27.3 10*3/uL — ABNORMAL HIGH (ref 4.0–10.5)
nRBC: 0 % (ref 0.0–0.2)

## 2019-12-12 LAB — FIBRIN DERIVATIVES D-DIMER (ARMC ONLY): Fibrin derivatives D-dimer (ARMC): 2868.65 ng{FEU}/mL — ABNORMAL HIGH (ref 0.00–499.00)

## 2019-12-12 LAB — COMPREHENSIVE METABOLIC PANEL
ALT: 11 U/L (ref 0–44)
AST: 17 U/L (ref 15–41)
Albumin: 2.6 g/dL — ABNORMAL LOW (ref 3.5–5.0)
Alkaline Phosphatase: 69 U/L (ref 38–126)
Anion gap: 7 (ref 5–15)
BUN: 19 mg/dL (ref 8–23)
CO2: 24 mmol/L (ref 22–32)
Calcium: 8.1 mg/dL — ABNORMAL LOW (ref 8.9–10.3)
Chloride: 102 mmol/L (ref 98–111)
Creatinine, Ser: 0.62 mg/dL (ref 0.44–1.00)
GFR calc Af Amer: >60 mL/min (ref >60)
GFR calc non Af Amer: >60 mL/min (ref >60)
Glucose, Bld: 175 mg/dL — ABNORMAL HIGH (ref 70–99)
Potassium: 3.3 mmol/L — ABNORMAL LOW (ref 3.5–5.1)
Sodium: 133 mmol/L — ABNORMAL LOW (ref 135–145)
Total Bilirubin: 0.7 mg/dL (ref 0.3–1.2)
Total Protein: 6.8 g/dL (ref 6.5–8.1)

## 2019-12-12 LAB — BRAIN NATRIURETIC PEPTIDE: B Natriuretic Peptide: 546 pg/mL — ABNORMAL HIGH (ref 0.0–100.0)

## 2019-12-12 LAB — GLUCOSE, CAPILLARY
Glucose-Capillary: 148 mg/dL — ABNORMAL HIGH (ref 70–99)
Glucose-Capillary: 149 mg/dL — ABNORMAL HIGH (ref 70–99)
Glucose-Capillary: 151 mg/dL — ABNORMAL HIGH (ref 70–99)
Glucose-Capillary: 239 mg/dL — ABNORMAL HIGH (ref 70–99)

## 2019-12-12 LAB — HEMOGLOBIN A1C
Hgb A1c MFr Bld: 7.1 % — ABNORMAL HIGH (ref 4.8–5.6)
Mean Plasma Glucose: 157.07 mg/dL

## 2019-12-12 LAB — LACTIC ACID, PLASMA: Lactic Acid, Venous: 2.9 mmol/L (ref 0.5–1.9)

## 2019-12-12 LAB — PROCALCITONIN: Procalcitonin: 24.87 ng/mL

## 2019-12-12 LAB — MRSA PCR SCREENING: MRSA by PCR: NEGATIVE

## 2019-12-12 LAB — MAGNESIUM: Magnesium: 1.4 mg/dL — ABNORMAL LOW (ref 1.7–2.4)

## 2019-12-12 LAB — STREP PNEUMONIAE URINARY ANTIGEN: Strep Pneumo Urinary Antigen: NEGATIVE

## 2019-12-12 MED ORDER — INSULIN ASPART 100 UNIT/ML ~~LOC~~ SOLN
0.0000 [IU] | Freq: Three times a day (TID) | SUBCUTANEOUS | Status: DC
  Administered 2019-12-12 (×2): 2 [IU] via SUBCUTANEOUS
  Administered 2019-12-12 – 2019-12-13 (×3): 3 [IU] via SUBCUTANEOUS
  Administered 2019-12-14 (×3): 2 [IU] via SUBCUTANEOUS
  Administered 2019-12-15: 1 [IU] via SUBCUTANEOUS
  Administered 2019-12-16: 5 [IU] via SUBCUTANEOUS
  Administered 2019-12-16: 3 [IU] via SUBCUTANEOUS
  Administered 2019-12-17 (×2): 2 [IU] via SUBCUTANEOUS
  Administered 2019-12-17 – 2019-12-19 (×5): 3 [IU] via SUBCUTANEOUS
  Administered 2019-12-19: 8 [IU] via SUBCUTANEOUS
  Administered 2019-12-20: 2 [IU] via SUBCUTANEOUS
  Filled 2019-12-12 (×20): qty 1

## 2019-12-12 MED ORDER — VANCOMYCIN HCL IN DEXTROSE 1-5 GM/200ML-% IV SOLN
1000.0000 mg | INTRAVENOUS | Status: DC
  Administered 2019-12-12 – 2019-12-13 (×2): 1000 mg via INTRAVENOUS
  Filled 2019-12-12 (×5): qty 200

## 2019-12-12 MED ORDER — POTASSIUM CHLORIDE CRYS ER 20 MEQ PO TBCR
40.0000 meq | EXTENDED_RELEASE_TABLET | Freq: Once | ORAL | Status: AC
  Administered 2019-12-12: 40 meq via ORAL
  Filled 2019-12-12: qty 2

## 2019-12-12 MED ORDER — LORAZEPAM 2 MG/ML IJ SOLN
1.0000 mg | Freq: Four times a day (QID) | INTRAMUSCULAR | Status: DC | PRN
  Filled 2019-12-12: qty 1

## 2019-12-12 MED ORDER — MAGNESIUM SULFATE 2 GM/50ML IV SOLN
2.0000 g | Freq: Once | INTRAVENOUS | Status: AC
  Administered 2019-12-12: 2 g via INTRAVENOUS
  Filled 2019-12-12 (×2): qty 50

## 2019-12-12 MED ORDER — PANTOPRAZOLE SODIUM 40 MG PO TBEC
40.0000 mg | DELAYED_RELEASE_TABLET | Freq: Two times a day (BID) | ORAL | Status: DC
  Administered 2019-12-12 – 2019-12-20 (×17): 40 mg via ORAL
  Filled 2019-12-12 (×17): qty 1

## 2019-12-12 MED ORDER — VITAMIN D 25 MCG (1000 UNIT) PO TABS
1000.0000 [IU] | ORAL_TABLET | Freq: Every day | ORAL | Status: DC
  Administered 2019-12-12 – 2019-12-20 (×9): 1000 [IU] via ORAL
  Filled 2019-12-12 (×9): qty 1

## 2019-12-12 MED ORDER — IPRATROPIUM-ALBUTEROL 0.5-2.5 (3) MG/3ML IN SOLN
3.0000 mL | RESPIRATORY_TRACT | Status: DC
  Administered 2019-12-12 – 2019-12-13 (×5): 3 mL via RESPIRATORY_TRACT
  Filled 2019-12-12 (×5): qty 3

## 2019-12-12 MED ORDER — KETOROLAC TROMETHAMINE 15 MG/ML IJ SOLN
15.0000 mg | Freq: Four times a day (QID) | INTRAMUSCULAR | Status: AC
  Administered 2019-12-12 – 2019-12-14 (×8): 15 mg via INTRAVENOUS
  Filled 2019-12-12 (×9): qty 1

## 2019-12-12 MED ORDER — METOPROLOL SUCCINATE ER 25 MG PO TB24
12.5000 mg | ORAL_TABLET | Freq: Every day | ORAL | Status: DC
  Administered 2019-12-12 – 2019-12-14 (×3): 12.5 mg via ORAL
  Filled 2019-12-12: qty 0.5
  Filled 2019-12-12 (×2): qty 1

## 2019-12-12 MED ORDER — SODIUM CHLORIDE 0.9 % IV SOLN
2.0000 g | Freq: Two times a day (BID) | INTRAVENOUS | Status: AC
  Administered 2019-12-12 – 2019-12-18 (×14): 2 g via INTRAVENOUS
  Filled 2019-12-12 (×15): qty 2

## 2019-12-12 MED ORDER — SODIUM CHLORIDE 0.9 % IV SOLN: 2.0000 g | Freq: Two times a day (BID) | INTRAVENOUS | Status: DC

## 2019-12-12 MED ORDER — HALOPERIDOL LACTATE 5 MG/ML IJ SOLN
5.0000 mg | Freq: Once | INTRAMUSCULAR | Status: AC
  Administered 2019-12-12: 5 mg via INTRAVENOUS
  Filled 2019-12-12: qty 1

## 2019-12-12 MED ORDER — HYDROMORPHONE HCL 1 MG/ML IJ SOLN
1.0000 mg | INTRAMUSCULAR | Status: DC | PRN
  Administered 2019-12-12 – 2019-12-18 (×2): 1 mg via INTRAVENOUS
  Filled 2019-12-12 (×2): qty 1

## 2019-12-12 MED ORDER — KETOROLAC TROMETHAMINE 30 MG/ML IJ SOLN
INTRAMUSCULAR | Status: AC
  Administered 2019-12-12: 15 mg
  Filled 2019-12-12: qty 1

## 2019-12-12 MED ORDER — ATORVASTATIN CALCIUM 20 MG PO TABS
20.0000 mg | ORAL_TABLET | Freq: Every day | ORAL | Status: DC
  Administered 2019-12-12 – 2019-12-19 (×8): 20 mg via ORAL
  Filled 2019-12-12 (×8): qty 1

## 2019-12-12 MED ORDER — SODIUM CHLORIDE 0.9 % IV SOLN: 2.0000 g | INTRAVENOUS | Status: DC

## 2019-12-12 MED ORDER — TRIAMCINOLONE ACETONIDE 0.1 % EX OINT
1.0000 "application " | TOPICAL_OINTMENT | Freq: Two times a day (BID) | CUTANEOUS | Status: DC
  Administered 2019-12-12 – 2019-12-16 (×8): 1 via TOPICAL
  Filled 2019-12-12 (×2): qty 15

## 2019-12-12 MED ORDER — ASPIRIN EC 81 MG PO TBEC
81.0000 mg | DELAYED_RELEASE_TABLET | Freq: Every day | ORAL | Status: DC
  Administered 2019-12-12 – 2019-12-17 (×6): 81 mg via ORAL
  Filled 2019-12-12 (×6): qty 1

## 2019-12-12 MED ORDER — CHLORHEXIDINE GLUCONATE CLOTH 2 % EX PADS
6.0000 | MEDICATED_PAD | Freq: Every day | CUTANEOUS | Status: DC
  Administered 2019-12-12 – 2019-12-13 (×2): 6 via TOPICAL

## 2019-12-12 MED ORDER — HYDROMORPHONE HCL 1 MG/ML IJ SOLN
0.5000 mg | INTRAMUSCULAR | Status: DC | PRN
  Administered 2019-12-12: 0.5 mg via INTRAVENOUS

## 2019-12-12 MED ORDER — INSULIN ASPART 100 UNIT/ML ~~LOC~~ SOLN
0.0000 [IU] | Freq: Every day | SUBCUTANEOUS | Status: DC
  Administered 2019-12-12 – 2019-12-13 (×2): 2 [IU] via SUBCUTANEOUS
  Administered 2019-12-18: 3 [IU] via SUBCUTANEOUS
  Filled 2019-12-12 (×3): qty 1

## 2019-12-12 MED ORDER — LISINOPRIL 10 MG PO TABS
5.0000 mg | ORAL_TABLET | Freq: Every day | ORAL | Status: DC
  Administered 2019-12-13 – 2019-12-18 (×5): 5 mg via ORAL
  Filled 2019-12-12 (×7): qty 1

## 2019-12-12 MED ORDER — LORAZEPAM 1 MG PO TABS
1.0000 mg | ORAL_TABLET | Freq: Four times a day (QID) | ORAL | Status: DC | PRN
  Administered 2019-12-12 – 2019-12-17 (×7): 1 mg via ORAL
  Filled 2019-12-12 (×7): qty 1

## 2019-12-12 MED ORDER — METHOCARBAMOL 500 MG PO TABS
500.0000 mg | ORAL_TABLET | Freq: Three times a day (TID) | ORAL | Status: DC
  Administered 2019-12-12 – 2019-12-20 (×25): 500 mg via ORAL
  Filled 2019-12-12 (×29): qty 1

## 2019-12-12 MED ORDER — PREDNISONE 20 MG PO TABS
40.0000 mg | ORAL_TABLET | Freq: Every day | ORAL | Status: DC
  Administered 2019-12-12 – 2019-12-13 (×2): 40 mg via ORAL
  Filled 2019-12-12 (×2): qty 2

## 2019-12-12 MED ORDER — LORAZEPAM 2 MG/ML IJ SOLN
INTRAMUSCULAR | Status: AC
  Administered 2019-12-12: 1 mg
  Filled 2019-12-12: qty 1

## 2019-12-12 MED ORDER — VENLAFAXINE HCL ER 75 MG PO CP24
150.0000 mg | ORAL_CAPSULE | Freq: Every day | ORAL | Status: DC
  Administered 2019-12-12 – 2019-12-20 (×9): 150 mg via ORAL
  Filled 2019-12-12 (×7): qty 2
  Filled 2019-12-12: qty 1
  Filled 2019-12-12: qty 2

## 2019-12-12 MED ORDER — INSULIN GLARGINE 100 UNIT/ML ~~LOC~~ SOLN
14.0000 [IU] | Freq: Every day | SUBCUTANEOUS | Status: DC
  Administered 2019-12-12 – 2019-12-14 (×3): 14 [IU] via SUBCUTANEOUS
  Filled 2019-12-12 (×6): qty 0.14

## 2019-12-12 MED ORDER — CLOTRIMAZOLE 1 % EX CREA
1.0000 "application " | TOPICAL_CREAM | Freq: Two times a day (BID) | CUTANEOUS | Status: DC
  Administered 2019-12-16 (×2): 1 via TOPICAL
  Filled 2019-12-12 (×2): qty 15

## 2019-12-12 MED ORDER — SODIUM CHLORIDE 0.9 % IV SOLN: INTRAVENOUS | Status: DC

## 2019-12-12 MED ORDER — ENOXAPARIN SODIUM 40 MG/0.4ML ~~LOC~~ SOLN
40.0000 mg | SUBCUTANEOUS | Status: DC
  Administered 2019-12-12 – 2019-12-18 (×7): 40 mg via SUBCUTANEOUS
  Filled 2019-12-12 (×7): qty 0.4

## 2019-12-12 MED ORDER — HYDROMORPHONE HCL 1 MG/ML IJ SOLN
INTRAMUSCULAR | Status: AC
  Filled 2019-12-12: qty 1

## 2019-12-12 NOTE — ED Notes (Signed)
Assigned bed @ 1415, spoke with RN Aldona Bar.

## 2019-12-12 NOTE — ED Notes (Addendum)
Pt given wet washcloth. Pt states she just feels so hot and can't get cooled off.  Temp adjusted in room

## 2019-12-12 NOTE — ED Notes (Signed)
Pt hs morphine pump for her lower back. Pt states it goes 24/7. Pt states this pain she is experiencing is a different kind of pain that is just all over and the pump isn't touching.

## 2019-12-12 NOTE — ED Notes (Signed)
Pt resting comfortably Due to family request this RN will hold checking CBG and given medication until pt wakes.   Pt has not had any sleep.

## 2019-12-12 NOTE — ED Notes (Signed)
Family at bedside. 

## 2019-12-12 NOTE — Progress Notes (Signed)
PROGRESS NOTE    Jordan Chan  [id number] DOB: Dec 21, 1950 DOA: 12/11/2019 PCP: [name], NP   Brief Narrative:   HPI: Jordan Chan is a 69 y.o. female with medical history significant of Diabetes, osteoarthritis, history of breast cancer with mastectomy and chemotherapy, asthma, COPD, chronic back pain, hyperlipidemia and chronic pain syndrome presented to the ER initially with significant back pain.  She has chronic back pain but this seemed to be out of tune with her typical back pain.  Symptoms have been going on for 3 days.  And this is moving to her back.  Rated as 10 out of 10 at some point.  Mainly in the chest wall bilaterally.  She also noted her oxygen dropping at night.  She was short of breath on arrival and was found to be hypoxic in the ER.  Denied any fever or chills.  Patient was evaluated and by imaging was found to have pneumonia.  It appears her chest pain was pleuritic in nature.  She is being admitted to the hospital with sepsis based on the SIRS and infiltrates.  Patient has suddenly deteriorated in the ER with worsening hypoxia tachypnea and is therefore being admitted to stepdown unit.  5/10: Patient seen and examined in emergency department.  Husband at bedside.  Patient endorsing severe acute on chronic back pain not responsive to implantable intrathecal morphine pump.  No fevers noted over interval.  Oxygen saturation improved.  Now saturating in 100% on 3 L.  Weaned off BiPAP.  Still endorsing some sweats and overheating feeling like she cannot hold down.  Patient afebrile.  Vital signs reassuring at this time.  Assessment & Plan:   Principal Problem:   Sepsis ([place]) Active Problems:   Malignant neoplasm of right breast (HCC)   GERD (gastroesophageal reflux disease)   COPD, moderate (HCC)   Hyperlipidemia associated with type 2 diabetes mellitus (HCC)   Chronic pain syndrome   Presence of intrathecal pump   Community acquired pneumonia  Sepsis due to pneumonia ([place])  Community-acquired pneumonia, severe Sepsis secondary to above Acute hypoxic respiratory failure secondary to above Sepsis protocol was initiated in the emergency department with broad-spectrum antibiotics These were discontinued by the admitting provider for reasons that are unclear Procalcitonin not ordered on admission, markedly elevated Fluid resuscitation initiated per sepsis protocol in emergency department Initially required BiPAP therapy however weaned off, now on 3 L Plan: Restart broad-spectrum antibiotics with vancomycin and cefepime Follow blood cultures, no growth to date Follow sputum cultures, no growth to date Intravenous fluids Wean down oxygen as tolerated Vitals per unit protocol  Chronic obstructive pulmonary disease Does not appear acutely exacerbated No indication for steroids at this time Will initiate nebulizer regimen  Acute on chronic pain Chronic pain syndrome Status post implantable intrathecal pain pump Patient follows with outpatient pain management Has had issues with pain control with increasing back pain over the past month Had pump evaluated by pain management doc approximately 1 month ago Per the patient's husband she has had a gradually worsening pain since that time Plan: No obvious meningeal signs, though difficult to exclude CNS infection Patient able to bend her neck without pain No fever noted Broad-spectrum antibiotics for sepsis as above Ideally would like MRI however patient is unable to tolerate at this time We will consider advanced imaging should clinical situation dictate  Insulin-dependent diabetes Lantus 14 units daily Sliding scale coverage Carb modified diet Check A1c  History of breast cancer Status post  right mastectomy Follow-up with oncology outpatient No vital signs or blood draws on the right side  GERD PPI   DVT prophylaxis: Lovenox Code Status: Full code, discussed with  patient and husband Family Communication: Husband at bedside  disposition Plan: Status is: Inpatient  Remains inpatient appropriate because:Inpatient level of care appropriate due to severity of illness   Dispo: The patient is from: Home              Anticipated d/c is to: Home              Anticipated d/c date is: 3 days              Patient currently is not medically stable to d/c.          Consultants:   none  Procedures:   none  Antimicrobials:   Vancomycin (5/8-  )  Cefepime (5/8-  )   Subjective: Patient seen and examined Continues to endorse severe back pain Also endorsing thermal dysregulation Husband at bedside  Objective: Vitals:   12/12/19 1245 12/12/19 1300 12/12/19 1330 12/12/19 1332  BP:  (!) 76/41 (!) 118/57 (!) 118/57  Pulse: 80 79 79 80  Resp:    18  Temp:      TempSrc:      SpO2: 100% 100% 100% 100%  Weight:      Height:        Intake/Output Summary (Last 24 hours) at 12/12/2019 1351 Last data filed at 12/12/2019 1047 Gross per 24 hour  Intake --  Output 2700 ml  Net -2700 ml   Filed Weights   12/11/19 1411  Weight: 64.4 kg    Examination:  General exam: Appears uncomfortable, unable to sit still, appears chronically ill Respiratory system: Scattered crackles bilaterally, equal air entry, normal work of breathing Cardiovascular system:   Tachycardic, regular rhythm, no murmurs Gastrointestinal system: Nondistended/nontender, positive bowel sounds Central nervous system: Alert and oriented. No focal neurological deficits. Extremities: Symmetric 5 x 5 power. Skin: No rashes, lesions or ulcers Psychiatry: Judgement and insight appear normal. Mood & affect appropriate.     Data Reviewed: I have personally reviewed following labs and imaging studies  CBC: Recent Labs  Lab 12/11/19 1405 12/12/19 0738  WBC 34.2* 27.3*  NEUTROABS  --  22.8*  HGB 11.7* 10.7*  HCT 36.2 33.5*  MCV 82.6 83.5  PLT 419* 459*   Basic  Metabolic Panel: Recent Labs  Lab 12/11/19 1405 12/12/19 0738  NA 129* 133*  K 4.5 3.3*  CL 95* 102  CO2 20* 24  GLUCOSE 121* 175*  BUN 29* 19  CREATININE 1.04* 0.62  CALCIUM 8.3* 8.1*  MG  --  1.4*   GFR: Estimated Creatinine Clearance: 52.7 mL/min (by C-G formula based on SCr of 0.62 mg/dL). Liver Function Tests: Recent Labs  Lab 12/12/19 0738  AST 17  ALT 11  ALKPHOS 69  BILITOT 0.7  PROT 6.8  ALBUMIN 2.6*   No results for input(s): LIPASE, AMYLASE in the last 168 hours. No results for input(s): AMMONIA in the last 168 hours. Coagulation Profile: Recent Labs  Lab 12/11/19 1405  INR 1.2   Cardiac Enzymes: No results for input(s): CKTOTAL, CKMB, CKMBINDEX, TROPONINI in the last 168 hours. BNP (last 3 results) No results for input(s): PROBNP in the last 8760 hours. HbA1C: No results for input(s): HGBA1C in the last 72 hours. CBG: Recent Labs  Lab 12/12/19 0905  GLUCAP 148*   Lipid Profile: No results for input(s):  CHOL, HDL, LDLCALC, TRIG, CHOLHDL, LDLDIRECT in the last 72 hours. Thyroid Function Tests: No results for input(s): TSH, T4TOTAL, FREET4, T3FREE, THYROIDAB in the last 72 hours. Anemia Panel: No results for input(s): VITAMINB12, FOLATE, FERRITIN, TIBC, IRON, RETICCTPCT in the last 72 hours. Sepsis Labs: Recent Labs  Lab 12/11/19 1405 12/11/19 1810 12/11/19 2012 12/12/19 0128 12/12/19 0738  PROCALCITON  --   --   --   --  24.87  LATICACIDVEN 3.4* 3.2* 4.5* 2.9*  --     Recent Results (from the past 240 hour(s))  Blood Culture (routine x 2)     Status: None (Preliminary result)   Collection Time: 12/11/19  2:05 PM   Specimen: BLOOD  Result Value Ref Range Status   Specimen Description BLOOD R UPPER ARM  Final   Special Requests   Final    BOTTLES DRAWN AEROBIC AND ANAEROBIC Blood Culture adequate volume   Culture   Final    NO GROWTH < 24 HOURS Performed at [facility], [address], [place], [place] [address]     Report Status PENDING  Incomplete  Blood Culture (routine x 2)     Status: None (Preliminary result)   Collection Time: 12/11/19  2:06 PM   Specimen: BLOOD  Result Value Ref Range Status   Specimen Description BLOOD RIGHT ANTECUBITAL  Final   Special Requests   Final    BOTTLES DRAWN AEROBIC AND ANAEROBIC Blood Culture results may not be optimal due to an inadequate volume of blood received in culture bottles   Culture   Final    NO GROWTH < 24 HOURS Performed at [facility], [address], [place], [place] [address]    Report Status PENDING  Incomplete  Respiratory Panel by RT PCR (Flu A&B, Covid) - Nasopharyngeal Swab     Status: None   Collection Time: 12/11/19  6:10 PM   Specimen: Nasopharyngeal Swab  Result Value Ref Range Status   SARS Coronavirus 2 by RT PCR NEGATIVE NEGATIVE Final    Comment: (NOTE) SARS-CoV-2 target nucleic acids are NOT DETECTED. The SARS-CoV-2 RNA is generally detectable in upper respiratoy specimens during the acute phase of infection. The lowest concentration of SARS-CoV-2 viral copies this assay can detect is 131 copies/mL. A negative result does not preclude SARS-Cov-2 infection and should not be used as the sole basis for treatment or other patient management decisions. A negative result may occur with  improper specimen collection/handling, submission of specimen other than nasopharyngeal swab, presence of viral mutation(s) within the areas targeted by this assay, and inadequate number of viral copies (<131 copies/mL). A negative result must be combined with clinical observations, patient history, and epidemiological information. The expected result is Negative. Fact Sheet for Patients:  PinkCheek.be Fact Sheet for Healthcare Providers:  GravelBags.it This test is not yet ap proved or cleared by the [place] FDA and  has been authorized for detection and/or diagnosis of  SARS-CoV-2 by FDA under an Emergency Use Authorization (EUA). This EUA will remain  in effect (meaning this test can be used) for the duration of the COVID-19 declaration under Section 564(b)(1) of the Act, 21 U.S.C. section 360bbb-3(b)(1), unless the authorization is terminated or revoked sooner.    Influenza A by PCR NEGATIVE NEGATIVE Final   Influenza B by PCR NEGATIVE NEGATIVE Final    Comment: (NOTE) The Xpert Xpress SARS-CoV-2/FLU/RSV assay is intended as an aid in  the diagnosis of influenza from Nasopharyngeal swab specimens and  should  not be used as a sole basis for treatment. Nasal washings and  aspirates are unacceptable for Xpert Xpress SARS-CoV-2/FLU/RSV  testing. Fact Sheet for Patients: PinkCheek.be Fact Sheet for Healthcare Providers: GravelBags.it This test is not yet approved or cleared by the [place] FDA and  has been authorized for detection and/or diagnosis of SARS-CoV-2 by  FDA under an Emergency Use Authorization (EUA). This EUA will remain  in effect (meaning this test can be used) for the duration of the  Covid-19 declaration under Section 564(b)(1) of the Act, 21  U.S.C. section 360bbb-3(b)(1), unless the authorization is  terminated or revoked. Performed at [facility], [address] [place], [place] [address]          Radiology Studies: DG Chest 2 View  Result Date: 12/11/2019 CLINICAL DATA:  Back pain. EXAM: CHEST - 2 VIEW COMPARISON:  June 01, 2017 FINDINGS: Mild, chronic appearing increased lung markings are seen. Mild patchy opacification of the posterior aspect of the right lung base is noted. There is mild blunting of the right costophrenic angle. No pneumothorax is identified. The heart size and mediastinal contours are within normal limits. A chronic seventh right rib fracture is noted. IMPRESSION: 1. Mild patchy opacification of the posterior aspect of the right  lung base likely consistent with atelectasis and/or infiltrate. Follow-up to resolution is recommended to exclude the presence of an underlying neoplastic process. 2. Mild blunting of the right costophrenic angle consistent with small pleural effusion. Electronically Signed   By: Virgina Norfolk M.D.   On: 12/11/2019 15:55   CT Angio Chest/Abd/Pel for Dissection W and/or Wo Contrast  Result Date: 12/11/2019 CLINICAL DATA:  Chest pain. EXAM: CT ANGIOGRAPHY CHEST, ABDOMEN AND PELVIS TECHNIQUE: Non-contrast CT of the chest was initially obtained. Multidetector CT imaging through the chest, abdomen and pelvis was performed using the standard protocol during bolus administration of intravenous contrast. Multiplanar reconstructed images and MIPs were obtained and reviewed to evaluate the vascular anatomy. CONTRAST:  144mL OMNIPAQUE IOHEXOL 350 MG/ML SOLN COMPARISON:  June 01, 2017 FINDINGS: CTA CHEST FINDINGS Cardiovascular: Satisfactory opacification of the pulmonary arteries to the segmental level. No evidence of pulmonary embolism. Normal heart size. No pericardial effusion. Mediastinum/Nodes: No enlarged mediastinal, hilar, or axillary lymph nodes. The trachea, and esophagus demonstrate no significant findings. Stable appearance of the thyroid gland. Lungs/Pleura: Stable right apical pleuroparenchymal scarring with dystrophic calcifications. Confluent consolidation in the right lower lobe. Patchy airspace consolidation versus 1.8 cm pulmonary nodule in the superior segment of the left lower lobe. Stable 3 mm subpleural pulmonary nodule in the posterior aspect of the superior segment of the left lower lobe. Musculoskeletal: No chest wall abnormality. No acute or significant osseous findings. Review of the MIP images confirms the above findings. CTA ABDOMEN AND PELVIS FINDINGS VASCULAR Aorta: Normal caliber aorta without aneurysm, dissection, vasculitis or significant stenosis. Celiac: Patent without evidence of  aneurysm, dissection, vasculitis or significant stenosis. SMA: Patent without evidence of aneurysm, dissection, vasculitis or significant stenosis. Renals: Both renal arteries are patent without evidence of aneurysm, dissection, vasculitis, fibromuscular dysplasia or significant stenosis. IMA: Patent without evidence of aneurysm, dissection, vasculitis or significant stenosis. Inflow: Patent without evidence of aneurysm, dissection, vasculitis or significant stenosis. Veins: No obvious venous abnormality within the limitations of this arterial phase study. Review of the MIP images confirms the above findings. NON-VASCULAR Hepatobiliary: No focal liver abnormality is seen. No gallstones, gallbladder wall thickening, or biliary dilatation. Pancreas: Unremarkable. No pancreatic ductal dilatation or surrounding inflammatory changes.  Spleen: Normal in size without focal abnormality. Adrenals/Urinary Tract: Normal adrenal glands. Bilateral benign-appearing renal cysts. The largest partially exophytic right renal cyst measures 3.6 cm. Normal appearance of the urinary bladder. Stomach/Bowel: Stomach is within normal limits. No evidence of bowel wall thickening, distention, or inflammatory changes. Lymphatic: No evidence of lymphadenopathy. Calcific atherosclerotic disease of the aorta and iliac arteries, mild. Reproductive: Status post hysterectomy. No adnexal masses. Other: No abdominal wall hernia or abnormality. No abdominopelvic ascites. Medicine pump in the right anterior abdominal wall. Musculoskeletal: No acute or significant osseous findings. Review of the MIP images confirms the above findings. IMPRESSION: 1. No evidence of pulmonary embolus or acute aortic syndrome. 2. Confluent consolidation in the right lower lobe concerning for pneumonia. 3. Patchy airspace consolidation versus 1.8 cm pulmonary nodule in the superior segment of the left lower lobe. Follow-up after empiric treatment is recommended. 4. Stable  right apical pleuroparenchymal scarring with dystrophic calcifications. 5. No evidence of acute abnormalities within the solid abdominal organs. 6. Bilateral benign-appearing renal cysts. 7. Mild calcific atherosclerotic disease of the aorta and iliac arteries. Aortic Atherosclerosis (ICD10-I70.0). Electronically Signed   By: Fidela Salisbury M.D.   On: 12/11/2019 17:43        Scheduled Meds:  aspirin EC  81 mg Oral Daily   atorvastatin  20 mg Oral Daily   cholecalciferol  1,000 Units Oral Daily   clotrimazole  1 application Topical BID   enoxaparin (LOVENOX) injection  40 mg Subcutaneous Q24H   insulin aspart  0-15 Units Subcutaneous TID WC   insulin aspart  0-5 Units Subcutaneous QHS   insulin glargine  14 Units Subcutaneous QHS   ipratropium-albuterol  3 mL Nebulization Q4H   ketorolac  15 mg Intravenous Q6H   lisinopril  5 mg Oral Daily   methocarbamol  500 mg Oral TID   metoprolol succinate  12.5 mg Oral Daily   pantoprazole  40 mg Oral BID   potassium chloride  40 mEq Oral Once   predniSONE  40 mg Oral Q breakfast   triamcinolone ointment  1 application Topical BID   venlafaxine XR  150 mg Oral Daily   Continuous Infusions:  sodium chloride 125 mL/hr at 12/12/19 [id number]   ceFEPime (MAXIPIME) IV Stopped (12/12/19 1250)   magnesium sulfate bolus IVPB     vancomycin       LOS: 1 day    Time spent: 35 minutes    Sidney Ace, MD [facility] Pager [phone]  If 7PM-7AM, please contact night-coverage 12/12/2019, 1:51 PM

## 2019-12-12 NOTE — ED Notes (Signed)
Lab at bedside unable to obtain specimens will send another Phlebotomist.

## 2019-12-12 NOTE — ED Notes (Signed)
Pt given breakfast tray

## 2019-12-12 NOTE — ED Notes (Signed)
Messaged ADmit MD concerning pt's low BP

## 2019-12-12 NOTE — ED Notes (Signed)
Pt provided with ice packs. Messaged Admit MD Sreenath and updated on pt's status.

## 2019-12-12 NOTE — Consult Note (Signed)
Pharmacy Antibiotic Note  Jordan Chan is a 69 y.o. female admitted on 12/11/2019 with sepsis.  Pharmacy has been consulted for Cefepime and vancomcyin dosing.  Patient received vancomycin 1500mg  IV x 1 dose 5/9   Plan: Start Vancomycin 1000 mg IV Q 24 hrs. Goal AUC 400-550. Expected AUC: 539 SCr used: 0.8 Css: 13.9  Start Cefepime 2g IV every 12 hours    Height: 4\' 10"  (147.3 cm) Weight: 64.4 kg (142 lb) IBW/kg (Calculated) : 40.9  Temp (24hrs), Avg:98.7 F (37.1 C), Min:97.4 F (36.3 C), Max:100.7 F (38.2 C)  Recent Labs  Lab 12/11/19 1405 12/11/19 1810 12/11/19 2012 12/12/19 0128 12/12/19 0738  WBC 34.2*  --   --   --  27.3*  CREATININE 1.04*  --   --   --  0.62  LATICACIDVEN 3.4* 3.2* 4.5* 2.9*  --     Estimated Creatinine Clearance: 52.7 mL/min (by C-G formula based on SCr of 0.62 mg/dL).    Allergies  Allergen Reactions  . Other Palpitations    IV steroids  . Pain Patch [Menthol] Anaphylaxis  . Fentanyl   . Avelox [Moxifloxacin Hcl In Nacl] Other (See Comments)    Upset stomach  . Doxycycline Diarrhea  . Erythromycin Nausea Only and Other (See Comments)    Can take a Z-Pak just fine Other reaction(s): Other (See Comments) Can take Z-Pak  Can take a Z-Pak just fine  . Moxifloxacin Hcl Other (See Comments)    Upset stomach Upset stomach  . Oxycontin [Oxycodone] Hives  . Ozempic [Semaglutide] Nausea Only    Antimicrobials this admission: 5/9 cefepime >>  5/9 vancomycin  >>  Microbiology results: 5/9  BCx: pending  5/10 MRSA PCR: pending   Thank you for allowing pharmacy to be a part of this patient's care.  Pernell Dupre, PharmD, BCPS Clinical Pharmacist 12/12/2019 11:12 AM

## 2019-12-12 NOTE — ED Notes (Signed)
ADMIT MD at bedside. Pt's O2 adjusted to 3L [place]. O2 at 100%.

## 2019-12-12 NOTE — ED Notes (Signed)
Pt A*OX4. Pt states she feels cold now but no pain like earlier. Socks placed on pt and pt given blanket. Pt resting again.  Rechecked BP-118/57

## 2019-12-12 NOTE — ED Notes (Signed)
Transportation at bedside to transport pt at this time. Family following behind.

## 2019-12-12 NOTE — ED Notes (Signed)
Received messaged back from [place] and he will come and reassess pt. Pt updated.

## 2019-12-12 NOTE — ED Notes (Signed)
Pt given meal tray. Family at bedside. Pt still sleeping. Respirations even and unlabored at this time

## 2019-12-12 NOTE — ED Notes (Signed)
Pt resting comfortably at this time.

## 2019-12-13 ENCOUNTER — Inpatient Hospital Stay: Payer: Medicare HMO

## 2019-12-13 LAB — CBC WITH DIFFERENTIAL/PLATELET
Abs Immature Granulocytes: 0.2 10*3/uL — ABNORMAL HIGH (ref 0.00–0.07)
Basophils Absolute: 0.1 10*3/uL (ref 0.0–0.1)
Basophils Relative: 1 %
Eosinophils Absolute: 0.1 10*3/uL (ref 0.0–0.5)
Eosinophils Relative: 1 %
HCT: 27.7 % — ABNORMAL LOW (ref 36.0–46.0)
Hemoglobin: 9 g/dL — ABNORMAL LOW (ref 12.0–15.0)
Immature Granulocytes: 1 %
Lymphocytes Relative: 6 %
Lymphs Abs: 1.4 10*3/uL (ref 0.7–4.0)
MCH: 26.4 pg (ref 26.0–34.0)
MCHC: 32.5 g/dL (ref 30.0–36.0)
MCV: 81.2 fL (ref 80.0–100.0)
Monocytes Absolute: 0.9 10*3/uL (ref 0.1–1.0)
Monocytes Relative: 4 %
Neutro Abs: 19.8 10*3/uL — ABNORMAL HIGH (ref 1.7–7.7)
Neutrophils Relative %: 87 %
Platelets: 398 10*3/uL (ref 150–400)
RBC: 3.41 MIL/uL — ABNORMAL LOW (ref 3.87–5.11)
RDW: 16.3 % — ABNORMAL HIGH (ref 11.5–15.5)
Smear Review: NORMAL
WBC: 22.5 10*3/uL — ABNORMAL HIGH (ref 4.0–10.5)
nRBC: 0 % (ref 0.0–0.2)

## 2019-12-13 LAB — BASIC METABOLIC PANEL
Anion gap: 6 (ref 5–15)
BUN: 21 mg/dL (ref 8–23)
CO2: 22 mmol/L (ref 22–32)
Calcium: 8.1 mg/dL — ABNORMAL LOW (ref 8.9–10.3)
Chloride: 110 mmol/L (ref 98–111)
Creatinine, Ser: 0.53 mg/dL (ref 0.44–1.00)
GFR calc Af Amer: >60 mL/min (ref >60)
GFR calc non Af Amer: >60 mL/min (ref >60)
Glucose, Bld: 116 mg/dL — ABNORMAL HIGH (ref 70–99)
Potassium: 3.5 mmol/L (ref 3.5–5.1)
Sodium: 138 mmol/L (ref 135–145)

## 2019-12-13 LAB — GLUCOSE, CAPILLARY
Glucose-Capillary: 112 mg/dL — ABNORMAL HIGH (ref 70–99)
Glucose-Capillary: 197 mg/dL — ABNORMAL HIGH (ref 70–99)
Glucose-Capillary: 193 mg/dL — ABNORMAL HIGH (ref 70–99)
Glucose-Capillary: 239 mg/dL — ABNORMAL HIGH (ref 70–99)

## 2019-12-13 LAB — HIV ANTIBODY (ROUTINE TESTING W REFLEX): HIV Screen 4th Generation wRfx: NONREACTIVE

## 2019-12-13 MED ORDER — IPRATROPIUM-ALBUTEROL 0.5-2.5 (3) MG/3ML IN SOLN
3.0000 mL | Freq: Four times a day (QID) | RESPIRATORY_TRACT | Status: DC
  Administered 2019-12-13 (×2): 3 mL via RESPIRATORY_TRACT
  Filled 2019-12-13 (×2): qty 3

## 2019-12-13 MED ORDER — BUTALBITAL-APAP-CAFFEINE 50-325-40 MG PO TABS
1.0000 | ORAL_TABLET | ORAL | Status: DC | PRN
  Administered 2019-12-13 – 2019-12-14 (×2): 1 via ORAL
  Filled 2019-12-13 (×2): qty 1

## 2019-12-13 MED ORDER — GABAPENTIN 300 MG PO CAPS
300.0000 mg | ORAL_CAPSULE | Freq: Three times a day (TID) | ORAL | Status: DC
  Administered 2019-12-13 – 2019-12-20 (×21): 300 mg via ORAL
  Filled 2019-12-13 (×21): qty 1

## 2019-12-13 NOTE — Progress Notes (Signed)
Patient called out to nurse station stating she could not breathe, about the same time CCMD called me stating she was sustaining 120-130's irregular HR. No known history of dysrhythmia. Replaced her nasal canula which she said had fallen out while she was eating. She was very anxious stating she couldn't get comfortable in the bed and she was hot. Denied pain. EKG done per Dr. Priscella Mann and all morning meds and PRN PO ativan given as well.  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 No chest pain. Mostly shortness of breath/tachypnea (93% on chronic 3L) and anxiety, both starting to improve. Gave all morning meds which included PO metoprolol. I will keep an eye on her telemetry. Dr. Priscella Mann aware.

## 2019-12-13 NOTE — Plan of Care (Signed)
There have been no acute events over night. The patient has been alert and oriented x4. Her vitals have been stable. She has been maintaining her oxygenation on 3L via nasal cannula. Appreciate RT assistance with airway clearance. New PIV placed by IV nurse. Continuous IV fluids infusing as ordered. IV antibiotic administered as ordered. Blood glucose monitored. Sliding scale insulin administered per order. Compliant with medication therapies. She received one dose of prn Dilaudid for back/neck pain. Also, received a dose of prn oral Ativan for anxiety. Both medications effective per patient. She remains on telemetry. Foley catheter in place and will be pulled per nursing protocol. Safety precautions in place. Bed alarm activated. Patient free of falls and injuries. Plan of care continued.   Problem: Education: Goal: Knowledge of General Education information will improve Description: Including pain rating scale, medication(s)/side effects and non-pharmacologic comfort measures Outcome: Progressing   Problem: Health Behavior/Discharge Planning: Goal: Ability to manage health-related needs will improve Outcome: Progressing   Problem: Clinical Measurements: Goal: Ability to maintain clinical measurements within normal limits will improve Outcome: Progressing Goal: Will remain free from infection Outcome: Progressing Goal: Diagnostic test results will improve Outcome: Progressing Goal: Respiratory complications will improve Outcome: Progressing Goal: Cardiovascular complication will be avoided Outcome: Progressing   Problem: Activity: Goal: Risk for activity intolerance will decrease Outcome: Progressing   Problem: Nutrition: Goal: Adequate nutrition will be maintained Outcome: Progressing   Problem: Coping: Goal: Level of anxiety will decrease Outcome: Progressing   Problem: Elimination: Goal: Will not experience complications related to bowel motility Outcome: Progressing Goal:  Will not experience complications related to urinary retention Outcome: Progressing   Problem: Pain Managment: Goal: General experience of comfort will improve Outcome: Progressing   Problem: Safety: Goal: Ability to remain free from injury will improve Outcome: Progressing   Problem: Skin Integrity: Goal: Risk for impaired skin integrity will decrease Outcome: Progressing

## 2019-12-13 NOTE — Progress Notes (Signed)
PROGRESS NOTE    Jordan Chan  [id number] DOB: [birth year] DOA: 12/11/2019 PCP: [name], NP   Brief Narrative:   HPI: Jordan Chan is a 69 y.o. female with medical history significant of Diabetes, osteoarthritis, history of breast cancer with mastectomy and chemotherapy, asthma, COPD, chronic back pain, hyperlipidemia and chronic pain syndrome presented to the ER initially with significant back pain.  She has chronic back pain but this seemed to be out of tune with her typical back pain.  Symptoms have been going on for 3 days.  And this is moving to her back.  Rated as 10 out of 10 at some point.  Mainly in the chest wall bilaterally.  She also noted her oxygen dropping at night.  She was short of breath on arrival and was found to be hypoxic in the ER.  Denied any fever or chills.  Patient was evaluated and by imaging was found to have pneumonia.  It appears her chest pain was pleuritic in nature.  She is being admitted to the hospital with sepsis based on the SIRS and infiltrates.  Patient has suddenly deteriorated in the ER with worsening hypoxia tachypnea and is therefore being admitted to stepdown unit.  5/10: Patient seen and examined in emergency department.  Husband at bedside.  Patient endorsing severe acute on chronic back pain not responsive to implantable intrathecal morphine pump.  No fevers noted over interval.  Oxygen saturation improved.  Now saturating in 100% on 3 L.  Weaned off BiPAP.  Still endorsing some sweats and overheating feeling like she cannot hold down.  Patient afebrile.  Vital signs reassuring at this time.  5/11: Patient seen and examined.  Transfer to floor.  Still with some anxiety and intermittent sinus tachycardia however much improved from prior.  Remains on 3 L nasal cannula.  Remains on broad-spectrum antibiotics.  No fevers noted over interval.  Back pain persistent but appears improved.  Assessment & Plan:   Principal Problem:  Sepsis ([place]) Active Problems:   Malignant neoplasm of right breast (HCC)   GERD (gastroesophageal reflux disease)   COPD, moderate (HCC)   Hyperlipidemia associated with type 2 diabetes mellitus (HCC)   Chronic pain syndrome   Presence of intrathecal pump   Community acquired pneumonia   Sepsis due to pneumonia ([place])  Community-acquired pneumonia, severe Sepsis secondary to above Acute hypoxic respiratory failure secondary to above Sepsis protocol was initiated in the emergency department with broad-spectrum antibiotics These were discontinued by the admitting provider for reasons that are unclear Procalcitonin not ordered on admission, markedly elevated Fluid resuscitation initiated per sepsis protocol in emergency department Initially required BiPAP therapy however weaned off, now on 3 L Remains on 3 L overnight Plan: Continue broad-spectrum antibiotics with vancomycin and cefepime Check CT thoracic and lumbar spine with and without contrast to evaluate for source of infection given intrathecal pain pump and fairly recent instrumentation. Follow blood cultures, no growth to date Follow sputum cultures, no growth to date Intravenous fluids Wean down oxygen as tolerated Vitals per unit protocol  Chronic obstructive pulmonary disease Does not appear acutely exacerbated No indication for steroids at this time Will initiate nebulizer regimen  Acute on chronic pain Chronic pain syndrome Status post implantable intrathecal pain pump Patient follows with outpatient pain management Has had issues with pain control with increasing back pain over the past month Had pump evaluated by pain management doc approximately 1 month ago Per the patient's husband she has had a  gradually worsening pain since that time Plan: No obvious meningeal signs, though difficult to exclude CNS infection Patient able to bend her neck without pain No fever noted Broad-spectrum antibiotics for sepsis as  above Ideally would like MRI however patient is unable to tolerate at this time We will order CT thoracic and lumbar spine with and without contrast  Insulin-dependent diabetes Lantus 14 units daily Sliding scale coverage Carb modified diet Check A1c  History of breast cancer Status post right mastectomy Follow-up with oncology outpatient No vital signs or blood draws on the right side  GERD PPI   DVT prophylaxis: Lovenox Code Status: Full code, discussed with patient and husband Family Communication: Husband at bedside  disposition Plan: Status is: Inpatient  Remains inpatient appropriate because:Inpatient level of care appropriate due to severity of illness   Dispo: The patient is from: Home              Anticipated d/c is to: Home              Anticipated d/c date is: 3 days              Patient currently is not medically stable to d/c.   Sepsis appears to be resolving.  Remaining on broad-spectrum IV antibiotics.  Further imaging survey of the thoracic and lumbar spine to evaluate for additional source of infection.   Consultants:   none  Procedures:   none  Antimicrobials:   Vancomycin (5/8-  )  Cefepime (5/8-  )   Subjective: Patient seen and examined Back pain improved No fevers over interval Report subjective improvement   Objective: Vitals:   12/13/19 0448 12/13/19 0448 12/13/19 0744 12/13/19 1150  BP:  (!) 103/59 (!) 112/53 129/66  Pulse:  87 85 98  Resp:  20 17 17   Temp:  (!) 97.4 F (36.3 C) (!) 97.5 F (36.4 C) 97.7 F (36.5 C)  TempSrc:  Oral Oral   SpO2:  99% 99% 94%  Weight: 66 kg     Height:        Intake/Output Summary (Last 24 hours) at 12/13/2019 1340 Last data filed at 12/13/2019 1150 Gross per 24 hour  Intake 2636.97 ml  Output 1750 ml  Net 886.97 ml   Filed Weights   12/11/19 1411 12/12/19 1648 12/13/19 0448  Weight: 64.4 kg 65.5 kg 66 kg    Examination:  General exam: Appears uncomfortable, unable to sit  still, appears chronically ill Respiratory system: Scattered crackles bilaterally, equal air entry, normal work of breathing Cardiovascular system:   Tachycardic, regular rhythm, no murmurs Gastrointestinal system: Nondistended/nontender, positive bowel sounds Central nervous system: Alert and oriented. No focal neurological deficits. Extremities: Symmetric 5 x 5 power. Skin: No rashes, lesions or ulcers Psychiatry: Judgement and insight appear normal. Mood & affect appropriate.     Data Reviewed: I have personally reviewed following labs and imaging studies  CBC: Recent Labs  Lab 12/11/19 1405 12/12/19 0738 12/13/19 0607  WBC 34.2* 27.3* 22.5*  NEUTROABS  --  22.8* 19.8*  HGB 11.7* 10.7* 9.0*  HCT 36.2 33.5* 27.7*  MCV 82.6 83.5 81.2  PLT 419* 459* [id number]   Basic Metabolic Panel: Recent Labs  Lab 12/11/19 1405 12/12/19 0738 12/13/19 0607  NA 129* 133* 138  K 4.5 3.3* 3.5  CL 95* 102 110  CO2 20* 24 22  GLUCOSE 121* 175* 116*  BUN 29* 19 21  CREATININE 1.04* 0.62 0.53  CALCIUM 8.3* 8.1* 8.1*  MG  --  1.4*  --    GFR: Estimated Creatinine Clearance: 53.3 mL/min (by C-G formula based on SCr of 0.53 mg/dL). Liver Function Tests: Recent Labs  Lab 12/12/19 0738  AST 17  ALT 11  ALKPHOS 69  BILITOT 0.7  PROT 6.8  ALBUMIN 2.6*   No results for input(s): LIPASE, AMYLASE in the last 168 hours. No results for input(s): AMMONIA in the last 168 hours. Coagulation Profile: Recent Labs  Lab 12/11/19 1405  INR 1.2   Cardiac Enzymes: No results for input(s): CKTOTAL, CKMB, CKMBINDEX, TROPONINI in the last 168 hours. BNP (last 3 results) No results for input(s): PROBNP in the last 8760 hours. HbA1C: Recent Labs    12/12/19 0738  HGBA1C 7.1*   CBG: Recent Labs  Lab 12/12/19 1425 12/12/19 1640 12/12/19 2039 12/13/19 0743 12/13/19 1149  GLUCAP 149* 151* 239* 112* 197*   Lipid Profile: No results for input(s): CHOL, HDL, LDLCALC, TRIG, CHOLHDL, LDLDIRECT in  the last 72 hours. Thyroid Function Tests: No results for input(s): TSH, T4TOTAL, FREET4, T3FREE, THYROIDAB in the last 72 hours. Anemia Panel: No results for input(s): VITAMINB12, FOLATE, FERRITIN, TIBC, IRON, RETICCTPCT in the last 72 hours. Sepsis Labs: Recent Labs  Lab 12/11/19 1405 12/11/19 1810 12/11/19 2012 12/12/19 0128 12/12/19 0738  PROCALCITON  --   --   --   --  24.87  LATICACIDVEN 3.4* 3.2* 4.5* 2.9*  --     Recent Results (from the past 240 hour(s))  Blood Culture (routine x 2)     Status: None (Preliminary result)   Collection Time: 12/11/19  2:05 PM   Specimen: BLOOD  Result Value Ref Range Status   Specimen Description BLOOD R UPPER ARM  Final   Special Requests   Final    BOTTLES DRAWN AEROBIC AND ANAEROBIC Blood Culture adequate volume   Culture   Final    NO GROWTH 2 DAYS Performed at [facility], [address] [place], [place] [address]    Report Status PENDING  Incomplete  Blood Culture (routine x 2)     Status: None (Preliminary result)   Collection Time: 12/11/19  2:06 PM   Specimen: BLOOD  Result Value Ref Range Status   Specimen Description BLOOD RIGHT ANTECUBITAL  Final   Special Requests   Final    BOTTLES DRAWN AEROBIC AND ANAEROBIC Blood Culture results may not be optimal due to an inadequate volume of blood received in culture bottles   Culture   Final    NO GROWTH 2 DAYS Performed at [facility], [address] [place], [place] [address]    Report Status PENDING  Incomplete  Urine culture     Status: Abnormal (Preliminary result)   Collection Time: 12/11/19  6:10 PM   Specimen: In/Out Cath Urine  Result Value Ref Range Status   Specimen Description   Final    IN/OUT CATH URINE Performed at [facility], [address], [place], [place] [address]    Special Requests   Final    NONE Performed at [facility], [address], [place], [place] [address]    Culture (A)  Final    2,000  COLONIES/mL STAPHYLOCOCCUS HAEMOLYTICUS 2,000 COLONIES/mL ENTEROCOCCUS FAECALIS 10,000 COLONIES/mL GROUP B STREP(S.AGALACTIAE)ISOLATED TESTING AGAINST S. AGALACTIAE NOT ROUTINELY PERFORMED DUE TO PREDICTABILITY OF AMP/PEN/VAN SUSCEPTIBILITY. Performed at [facility], [place] [address], [place], [place] [address]    Report Status PENDING  Incomplete  Respiratory Panel by RT PCR (Flu A&B, Covid) -  Nasopharyngeal Swab     Status: None   Collection Time: 12/11/19  6:10 PM   Specimen: Nasopharyngeal Swab  Result Value Ref Range Status   SARS Coronavirus 2 by RT PCR NEGATIVE NEGATIVE Final    Comment: (NOTE) SARS-CoV-2 target nucleic acids are NOT DETECTED. The SARS-CoV-2 RNA is generally detectable in upper respiratoy specimens during the acute phase of infection. The lowest concentration of SARS-CoV-2 viral copies this assay can detect is 131 copies/mL. A negative result does not preclude SARS-Cov-2 infection and should not be used as the sole basis for treatment or other patient management decisions. A negative result may occur with  improper specimen collection/handling, submission of specimen other than nasopharyngeal swab, presence of viral mutation(s) within the areas targeted by this assay, and inadequate number of viral copies (<131 copies/mL). A negative result must be combined with clinical observations, patient history, and epidemiological information. The expected result is Negative. Fact Sheet for Patients:  PinkCheek.be Fact Sheet for Healthcare Providers:  GravelBags.it This test is not yet ap proved or cleared by the [place] FDA and  has been authorized for detection and/or diagnosis of SARS-CoV-2 by FDA under an Emergency Use Authorization (EUA). This EUA will remain  in effect (meaning this test can be used) for the duration of the COVID-19 declaration under Section 564(b)(1) of the Act, 21  U.S.C. section 360bbb-3(b)(1), unless the authorization is terminated or revoked sooner.    Influenza A by PCR NEGATIVE NEGATIVE Final   Influenza B by PCR NEGATIVE NEGATIVE Final    Comment: (NOTE) The Xpert Xpress SARS-CoV-2/FLU/RSV assay is intended as an aid in  the diagnosis of influenza from Nasopharyngeal swab specimens and  should not be used as a sole basis for treatment. Nasal washings and  aspirates are unacceptable for Xpert Xpress SARS-CoV-2/FLU/RSV  testing. Fact Sheet for Patients: PinkCheek.be Fact Sheet for Healthcare Providers: GravelBags.it This test is not yet approved or cleared by the [place] FDA and  has been authorized for detection and/or diagnosis of SARS-CoV-2 by  FDA under an Emergency Use Authorization (EUA). This EUA will remain  in effect (meaning this test can be used) for the duration of the  Covid-19 declaration under Section 564(b)(1) of the Act, 21  U.S.C. section 360bbb-3(b)(1), unless the authorization is  terminated or revoked. Performed at [facility], [address] [place], [place] [address]   MRSA PCR Screening     Status: None   Collection Time: 12/12/19 11:13 AM   Specimen: Nasopharyngeal  Result Value Ref Range Status   MRSA by PCR NEGATIVE NEGATIVE Final    Comment:        The GeneXpert MRSA Assay (FDA approved for NASAL specimens only), is one component of a comprehensive MRSA colonization surveillance program. It is not intended to diagnose MRSA infection nor to guide or monitor treatment for MRSA infections. Performed at [facility], [address] [place], [place] [address]          Radiology Studies: DG Chest 2 View  Result Date: 12/11/2019 CLINICAL DATA:  Back pain. EXAM: CHEST - 2 VIEW COMPARISON:  June 01, 2017 FINDINGS: Mild, chronic appearing increased lung markings are seen. Mild patchy opacification of the posterior  aspect of the right lung base is noted. There is mild blunting of the right costophrenic angle. No pneumothorax is identified. The heart size and mediastinal contours are within normal limits. A chronic seventh right rib fracture is noted. IMPRESSION: 1. Mild patchy opacification of  the posterior aspect of the right lung base likely consistent with atelectasis and/or infiltrate. Follow-up to resolution is recommended to exclude the presence of an underlying neoplastic process. 2. Mild blunting of the right costophrenic angle consistent with small pleural effusion. Electronically Signed   By: Virgina Norfolk M.D.   On: 12/11/2019 15:55   CT Angio Chest/Abd/Pel for Dissection W and/or Wo Contrast  Result Date: 12/11/2019 CLINICAL DATA:  Chest pain. EXAM: CT ANGIOGRAPHY CHEST, ABDOMEN AND PELVIS TECHNIQUE: Non-contrast CT of the chest was initially obtained. Multidetector CT imaging through the chest, abdomen and pelvis was performed using the standard protocol during bolus administration of intravenous contrast. Multiplanar reconstructed images and MIPs were obtained and reviewed to evaluate the vascular anatomy. CONTRAST:  116mL OMNIPAQUE IOHEXOL 350 MG/ML SOLN COMPARISON:  June 01, 2017 FINDINGS: CTA CHEST FINDINGS Cardiovascular: Satisfactory opacification of the pulmonary arteries to the segmental level. No evidence of pulmonary embolism. Normal heart size. No pericardial effusion. Mediastinum/Nodes: No enlarged mediastinal, hilar, or axillary lymph nodes. The trachea, and esophagus demonstrate no significant findings. Stable appearance of the thyroid gland. Lungs/Pleura: Stable right apical pleuroparenchymal scarring with dystrophic calcifications. Confluent consolidation in the right lower lobe. Patchy airspace consolidation versus 1.8 cm pulmonary nodule in the superior segment of the left lower lobe. Stable 3 mm subpleural pulmonary nodule in the posterior aspect of the superior segment of the left  lower lobe. Musculoskeletal: No chest wall abnormality. No acute or significant osseous findings. Review of the MIP images confirms the above findings. CTA ABDOMEN AND PELVIS FINDINGS VASCULAR Aorta: Normal caliber aorta without aneurysm, dissection, vasculitis or significant stenosis. Celiac: Patent without evidence of aneurysm, dissection, vasculitis or significant stenosis. SMA: Patent without evidence of aneurysm, dissection, vasculitis or significant stenosis. Renals: Both renal arteries are patent without evidence of aneurysm, dissection, vasculitis, fibromuscular dysplasia or significant stenosis. IMA: Patent without evidence of aneurysm, dissection, vasculitis or significant stenosis. Inflow: Patent without evidence of aneurysm, dissection, vasculitis or significant stenosis. Veins: No obvious venous abnormality within the limitations of this arterial phase study. Review of the MIP images confirms the above findings. NON-VASCULAR Hepatobiliary: No focal liver abnormality is seen. No gallstones, gallbladder wall thickening, or biliary dilatation. Pancreas: Unremarkable. No pancreatic ductal dilatation or surrounding inflammatory changes. Spleen: Normal in size without focal abnormality. Adrenals/Urinary Tract: Normal adrenal glands. Bilateral benign-appearing renal cysts. The largest partially exophytic right renal cyst measures 3.6 cm. Normal appearance of the urinary bladder. Stomach/Bowel: Stomach is within normal limits. No evidence of bowel wall thickening, distention, or inflammatory changes. Lymphatic: No evidence of lymphadenopathy. Calcific atherosclerotic disease of the aorta and iliac arteries, mild. Reproductive: Status post hysterectomy. No adnexal masses. Other: No abdominal wall hernia or abnormality. No abdominopelvic ascites. Medicine pump in the right anterior abdominal wall. Musculoskeletal: No acute or significant osseous findings. Review of the MIP images confirms the above findings.  IMPRESSION: 1. No evidence of pulmonary embolus or acute aortic syndrome. 2. Confluent consolidation in the right lower lobe concerning for pneumonia. 3. Patchy airspace consolidation versus 1.8 cm pulmonary nodule in the superior segment of the left lower lobe. Follow-up after empiric treatment is recommended. 4. Stable right apical pleuroparenchymal scarring with dystrophic calcifications. 5. No evidence of acute abnormalities within the solid abdominal organs. 6. Bilateral benign-appearing renal cysts. 7. Mild calcific atherosclerotic disease of the aorta and iliac arteries. Aortic Atherosclerosis (ICD10-I70.0). Electronically Signed   By: Fidela Salisbury M.D.   On: 12/11/2019 17:43        Scheduled Meds: .  aspirin EC  81 mg Oral Daily  . atorvastatin  20 mg Oral Daily  . Chlorhexidine Gluconate Cloth  6 each Topical Daily  . cholecalciferol  1,000 Units Oral Daily  . clotrimazole  1 application Topical BID  . enoxaparin (LOVENOX) injection  40 mg Subcutaneous Q24H  . gabapentin  300 mg Oral TID  . insulin aspart  0-15 Units Subcutaneous TID WC  . insulin aspart  0-5 Units Subcutaneous QHS  . insulin glargine  14 Units Subcutaneous QHS  . ipratropium-albuterol  3 mL Nebulization Q6H  . ketorolac  15 mg Intravenous Q6H  . lisinopril  5 mg Oral Daily  . methocarbamol  500 mg Oral TID  . metoprolol succinate  12.5 mg Oral Daily  . pantoprazole  40 mg Oral BID  . triamcinolone ointment  1 application Topical BID  . venlafaxine XR  150 mg Oral Daily   Continuous Infusions: . sodium chloride 125 mL/hr at 12/13/19 1049  . ceFEPime (MAXIPIME) IV 2 g (12/13/19 0910)  . vancomycin 1,000 mg (12/12/19 1832)     LOS: 2 days    Time spent: 35 minutes    Sidney Ace, MD [facility] Pager [phone]  If 7PM-7AM, please contact night-coverage 12/13/2019, 1:40 PM

## 2019-12-14 ENCOUNTER — Ambulatory Visit: Payer: Medicare HMO | Admitting: Nurse Practitioner

## 2019-12-14 DIAGNOSIS — B370 Candidal stomatitis: Secondary | ICD-10-CM

## 2019-12-14 DIAGNOSIS — Z794 Long term (current) use of insulin: Secondary | ICD-10-CM

## 2019-12-14 DIAGNOSIS — J181 Lobar pneumonia, unspecified organism: Secondary | ICD-10-CM

## 2019-12-14 DIAGNOSIS — J9621 Acute and chronic respiratory failure with hypoxia: Secondary | ICD-10-CM

## 2019-12-14 DIAGNOSIS — G8929 Other chronic pain: Secondary | ICD-10-CM

## 2019-12-14 DIAGNOSIS — M545 Low back pain: Secondary | ICD-10-CM

## 2019-12-14 DIAGNOSIS — E119 Type 2 diabetes mellitus without complications: Secondary | ICD-10-CM

## 2019-12-14 DIAGNOSIS — R6520 Severe sepsis without septic shock: Secondary | ICD-10-CM

## 2019-12-14 DIAGNOSIS — J9601 Acute respiratory failure with hypoxia: Secondary | ICD-10-CM

## 2019-12-14 LAB — URINE CULTURE: Culture: 2000 — AB

## 2019-12-14 LAB — CBC WITH DIFFERENTIAL/PLATELET
Abs Immature Granulocytes: 0.26 10*3/uL — ABNORMAL HIGH (ref 0.00–0.07)
Basophils Absolute: 0.1 10*3/uL (ref 0.0–0.1)
Basophils Relative: 1 %
Eosinophils Absolute: 0 10*3/uL (ref 0.0–0.5)
Eosinophils Relative: 0 %
HCT: 25.7 % — ABNORMAL LOW (ref 36.0–46.0)
Hemoglobin: 8 g/dL — ABNORMAL LOW (ref 12.0–15.0)
Immature Granulocytes: 1 %
Lymphocytes Relative: 6 %
Lymphs Abs: 1.2 10*3/uL (ref 0.7–4.0)
MCH: 26.5 pg (ref 26.0–34.0)
MCHC: 31.1 g/dL (ref 30.0–36.0)
MCV: 85.1 fL (ref 80.0–100.0)
Monocytes Absolute: 1.3 10*3/uL — ABNORMAL HIGH (ref 0.1–1.0)
Monocytes Relative: 7 %
Neutro Abs: 16.2 10*3/uL — ABNORMAL HIGH (ref 1.7–7.7)
Neutrophils Relative %: 85 %
Platelets: 370 10*3/uL (ref 150–400)
RBC: 3.02 MIL/uL — ABNORMAL LOW (ref 3.87–5.11)
RDW: 17 % — ABNORMAL HIGH (ref 11.5–15.5)
Smear Review: NORMAL
WBC: 19.1 10*3/uL — ABNORMAL HIGH (ref 4.0–10.5)
nRBC: 0.1 % (ref 0.0–0.2)

## 2019-12-14 LAB — BASIC METABOLIC PANEL
Anion gap: 4 — ABNORMAL LOW (ref 5–15)
BUN: 15 mg/dL (ref 8–23)
CO2: 21 mmol/L — ABNORMAL LOW (ref 22–32)
Calcium: 8 mg/dL — ABNORMAL LOW (ref 8.9–10.3)
Chloride: 112 mmol/L — ABNORMAL HIGH (ref 98–111)
Creatinine, Ser: 0.6 mg/dL (ref 0.44–1.00)
GFR calc Af Amer: >60 mL/min (ref >60)
GFR calc non Af Amer: >60 mL/min (ref >60)
Glucose, Bld: 165 mg/dL — ABNORMAL HIGH (ref 70–99)
Potassium: 3.8 mmol/L (ref 3.5–5.1)
Sodium: 137 mmol/L (ref 135–145)

## 2019-12-14 LAB — GLUCOSE, CAPILLARY
Glucose-Capillary: 134 mg/dL — ABNORMAL HIGH (ref 70–99)
Glucose-Capillary: 131 mg/dL — ABNORMAL HIGH (ref 70–99)
Glucose-Capillary: 127 mg/dL — ABNORMAL HIGH (ref 70–99)
Glucose-Capillary: 152 mg/dL — ABNORMAL HIGH (ref 70–99)

## 2019-12-14 LAB — MAGNESIUM: Magnesium: 1.7 mg/dL (ref 1.7–2.4)

## 2019-12-14 MED ORDER — IPRATROPIUM-ALBUTEROL 0.5-2.5 (3) MG/3ML IN SOLN: 3.0000 mL | Freq: Four times a day (QID) | RESPIRATORY_TRACT | Status: DC | PRN

## 2019-12-14 MED ORDER — FLUCONAZOLE IN SODIUM CHLORIDE 200-0.9 MG/100ML-% IV SOLN
200.0000 mg | Freq: Once | INTRAVENOUS | Status: AC
  Administered 2019-12-14: 200 mg via INTRAVENOUS
  Filled 2019-12-14: qty 100

## 2019-12-14 MED ORDER — AZITHROMYCIN 250 MG PO TABS
250.0000 mg | ORAL_TABLET | Freq: Every day | ORAL | Status: AC
  Administered 2019-12-15 – 2019-12-18 (×4): 250 mg via ORAL
  Filled 2019-12-14 (×5): qty 1

## 2019-12-14 MED ORDER — IPRATROPIUM-ALBUTEROL 0.5-2.5 (3) MG/3ML IN SOLN
3.0000 mL | Freq: Three times a day (TID) | RESPIRATORY_TRACT | Status: DC
  Administered 2019-12-14 – 2019-12-18 (×13): 3 mL via RESPIRATORY_TRACT
  Filled 2019-12-14 (×13): qty 3

## 2019-12-14 MED ORDER — FLUCONAZOLE 100MG IVPB
100.0000 mg | INTRAVENOUS | Status: DC
  Administered 2019-12-15 – 2019-12-20 (×6): 100 mg via INTRAVENOUS
  Filled 2019-12-14 (×6): qty 50

## 2019-12-14 MED ORDER — POLYETHYLENE GLYCOL 3350 17 G PO PACK
17.0000 g | PACK | Freq: Every day | ORAL | Status: DC
  Administered 2019-12-15 – 2019-12-18 (×4): 17 g via ORAL
  Filled 2019-12-14 (×5): qty 1

## 2019-12-14 MED ORDER — NYSTATIN 100000 UNIT/ML MT SUSP
5.0000 mL | Freq: Four times a day (QID) | OROMUCOSAL | Status: DC
  Administered 2019-12-14 – 2019-12-17 (×13): 500000 [IU] via ORAL
  Filled 2019-12-14 (×13): qty 5

## 2019-12-14 MED ORDER — MAGNESIUM SULFATE 2 GM/50ML IV SOLN
2.0000 g | Freq: Once | INTRAVENOUS | Status: AC
  Administered 2019-12-14: 2 g via INTRAVENOUS
  Filled 2019-12-14: qty 50

## 2019-12-14 MED ORDER — AZITHROMYCIN 250 MG PO TABS
500.0000 mg | ORAL_TABLET | Freq: Every day | ORAL | Status: AC
  Administered 2019-12-14: 500 mg via ORAL
  Filled 2019-12-14: qty 2

## 2019-12-14 NOTE — Progress Notes (Signed)
SATURATION QUALIFICATIONS: (This note is used to comply with regulatory documentation for home oxygen)  Patient Saturations on Room Air at Rest = 92%  Patient Saturations on Room Air while Ambulating = 85%    Please briefly explain why patient needs home oxygen: Pt ambulates from bathroom to the chair. She desaturate at 85% to 88%. She sat in the chair and encouraged to breathe deep, patient's O2 saturation goes up to 94% at [employer].

## 2019-12-14 NOTE — Progress Notes (Signed)
[name], female   DOB: May 11, 1951, 69 y.o.   MRN: [id number] [facility] PROGRESS NOTE  Jordan Chan [id number] DOB: 19-Feb-1951 DOA: 12/11/2019 PCP: [name], NP  HPI/Subjective: Interested in going home as soon as possible.  Patient does have some cough and some shortness of breath.  Her back pain is no different than usual.  She has chronic back pain.  She has had thrush and feels bumps in her mouth and sore throat.  She has had 2 courses of Diflucan already.  Objective: Vitals:   12/14/19 0948 12/14/19 1146  BP: (!) 107/52 124/65  Pulse: (!) 106 98  Resp: 18 19  Temp: 97.8 F (36.6 C) 97.6 F (36.4 C)  SpO2: 92% 95%    Intake/Output Summary (Last 24 hours) at 12/14/2019 1419 Last data filed at 12/13/2019 1518 Gross per 24 hour  Intake 1065.82 ml  Output 250 ml  Net 815.82 ml   Filed Weights   12/11/19 1411 12/12/19 1648 12/13/19 0448  Weight: 64.4 kg 65.5 kg 66 kg    ROS: Review of Systems  Constitutional: Negative for fever.  Eyes: Negative for blurred vision.  Respiratory: Positive for cough and shortness of breath.   Cardiovascular: Negative for chest pain.  Gastrointestinal: Negative for abdominal pain, constipation, diarrhea, nausea and vomiting.  Genitourinary: Negative for dysuria.  Musculoskeletal: Positive for back pain. Negative for joint pain.  Neurological: Negative for dizziness.   Exam: Physical Exam  Constitutional: She is oriented to person, place, and time.  HENT:  Nose: No mucosal edema.  Mouth/Throat: No oropharyngeal exudate or posterior oropharyngeal edema.  Thrush in the mouth bilaterally and on tongue.  Eyes: Conjunctivae and lids are normal.  Neck: Carotid bruit is not present.  Cardiovascular: S1 normal and S2 normal. Exam reveals no gallop.  No murmur heard. Respiratory: No respiratory distress. She has decreased breath sounds in the right lower field and the left lower field. She has no  wheezes. She has no rhonchi. She has no rales.  GI: Soft. Bowel sounds are normal. There is no abdominal tenderness.  Musculoskeletal:     Right ankle: No swelling.     Left ankle: No swelling.  Lymphadenopathy:    She has no cervical adenopathy.  Neurological: She is alert and oriented to person, place, and time. No cranial nerve deficit.  Skin: Skin is warm. No rash noted. Nails show no clubbing.  Psychiatric: She has a normal mood and affect.      Data Reviewed: Basic Metabolic Panel: Recent Labs  Lab 12/11/19 1405 12/12/19 0738 12/13/19 0607 12/14/19 0415  NA 129* 133* 138 137  K 4.5 3.3* 3.5 3.8  CL 95* 102 110 112*  CO2 20* 24 22 21*  GLUCOSE 121* 175* 116* 165*  BUN 29* 19 21 15   CREATININE 1.04* 0.62 0.53 0.60  CALCIUM 8.3* 8.1* 8.1* 8.0*  MG  --  1.4*  --  1.7   Liver Function Tests: Recent Labs  Lab 12/12/19 0738  AST 17  ALT 11  ALKPHOS 69  BILITOT 0.7  PROT 6.8  ALBUMIN 2.6*   CBC: Recent Labs  Lab 12/11/19 1405 12/12/19 0738 12/13/19 0607 12/14/19 0415  WBC 34.2* 27.3* 22.5* 19.1*  NEUTROABS  --  22.8* 19.8* 16.2*  HGB 11.7* 10.7* 9.0* 8.0*  HCT 36.2 33.5* 27.7* 25.7*  MCV 82.6 83.5 81.2 85.1  PLT 419* 459* 398 370   BNP (last 3 results) Recent Labs    12/12/19  [id number]  BNP 546.0*    CBG: Recent Labs  Lab 12/13/19 1149 12/13/19 1649 12/13/19 2113 12/14/19 0750 12/14/19 1146  GLUCAP 197* 193* 239* 134* 131*    Recent Results (from the past 240 hour(s))  Blood Culture (routine x 2)     Status: None (Preliminary result)   Collection Time: 12/11/19  2:05 PM   Specimen: BLOOD  Result Value Ref Range Status   Specimen Description BLOOD R UPPER ARM  Final   Special Requests   Final    BOTTLES DRAWN AEROBIC AND ANAEROBIC Blood Culture adequate volume   Culture   Final    NO GROWTH 3 DAYS Performed at [facility], [address], [place], [place] [address]    Report Status PENDING  Incomplete  Blood Culture (routine x  2)     Status: None (Preliminary result)   Collection Time: 12/11/19  2:06 PM   Specimen: BLOOD  Result Value Ref Range Status   Specimen Description BLOOD RIGHT ANTECUBITAL  Final   Special Requests   Final    BOTTLES DRAWN AEROBIC AND ANAEROBIC Blood Culture results may not be optimal due to an inadequate volume of blood received in culture bottles   Culture   Final    NO GROWTH 3 DAYS Performed at [facility], [address] [place], [place] [address]    Report Status PENDING  Incomplete  Urine culture     Status: Abnormal   Collection Time: 12/11/19  6:10 PM   Specimen: In/Out Cath Urine  Result Value Ref Range Status   Specimen Description   Final    IN/OUT CATH URINE Performed at [facility], [address], [place], [place] [address]    Special Requests   Final    NONE Performed at [facility], [address], [place], [place] [address]    Culture (A)  Final    2,000 COLONIES/mL STAPHYLOCOCCUS HAEMOLYTICUS 2,000 COLONIES/mL ENTEROCOCCUS FAECALIS 10,000 COLONIES/mL GROUP B STREP(S.AGALACTIAE)ISOLATED TESTING AGAINST S. AGALACTIAE NOT ROUTINELY PERFORMED DUE TO PREDICTABILITY OF AMP/PEN/VAN SUSCEPTIBILITY. Performed at [facility], [place] [address], [place], [place] [address]    Report Status 12/14/2019 FINAL  Final   Organism ID, Bacteria STAPHYLOCOCCUS HAEMOLYTICUS (A)  Final   Organism ID, Bacteria ENTEROCOCCUS FAECALIS (A)  Final      Susceptibility   Enterococcus faecalis - MIC*    AMPICILLIN <=2 SENSITIVE Sensitive     NITROFURANTOIN <=16 SENSITIVE Sensitive     VANCOMYCIN 1 SENSITIVE Sensitive     * 2,000 COLONIES/mL ENTEROCOCCUS FAECALIS   Staphylococcus haemolyticus - MIC*    CIPROFLOXACIN <=0.5 SENSITIVE Sensitive     GENTAMICIN <=0.5 SENSITIVE Sensitive     NITROFURANTOIN <=16 SENSITIVE Sensitive     OXACILLIN <=0.25 SENSITIVE Sensitive     TETRACYCLINE <=1 SENSITIVE Sensitive     VANCOMYCIN <=0.5 SENSITIVE Sensitive      TRIMETH/SULFA <=10 SENSITIVE Sensitive     CLINDAMYCIN >=8 RESISTANT Resistant     RIFAMPIN <=0.5 SENSITIVE Sensitive     Inducible Clindamycin NEGATIVE Sensitive     * 2,000 COLONIES/mL STAPHYLOCOCCUS HAEMOLYTICUS  Respiratory Panel by RT PCR (Flu A&B, Covid) - Nasopharyngeal Swab     Status: None   Collection Time: 12/11/19  6:10 PM   Specimen: Nasopharyngeal Swab  Result Value Ref Range Status   SARS Coronavirus 2 by RT PCR NEGATIVE NEGATIVE Final    Comment: (NOTE) SARS-CoV-2 target nucleic acids are NOT DETECTED. The SARS-CoV-2 RNA is  generally detectable in upper respiratoy specimens during the acute phase of infection. The lowest concentration of SARS-CoV-2 viral copies this assay can detect is 131 copies/mL. A negative result does not preclude SARS-Cov-2 infection and should not be used as the sole basis for treatment or other patient management decisions. A negative result may occur with  improper specimen collection/handling, submission of specimen other than nasopharyngeal swab, presence of viral mutation(s) within the areas targeted by this assay, and inadequate number of viral copies (<131 copies/mL). A negative result must be combined with clinical observations, patient history, and epidemiological information. The expected result is Negative. Fact Sheet for Patients:  PinkCheek.be Fact Sheet for Healthcare Providers:  GravelBags.it This test is not yet ap proved or cleared by the [place] FDA and  has been authorized for detection and/or diagnosis of SARS-CoV-2 by FDA under an Emergency Use Authorization (EUA). This EUA will remain  in effect (meaning this test can be used) for the duration of the COVID-19 declaration under Section 564(b)(1) of the Act, 21 U.S.C. section 360bbb-3(b)(1), unless the authorization is terminated or revoked sooner.    Influenza A by PCR NEGATIVE NEGATIVE Final    Influenza B by PCR NEGATIVE NEGATIVE Final    Comment: (NOTE) The Xpert Xpress SARS-CoV-2/FLU/RSV assay is intended as an aid in  the diagnosis of influenza from Nasopharyngeal swab specimens and  should not be used as a sole basis for treatment. Nasal washings and  aspirates are unacceptable for Xpert Xpress SARS-CoV-2/FLU/RSV  testing. Fact Sheet for Patients: PinkCheek.be Fact Sheet for Healthcare Providers: GravelBags.it This test is not yet approved or cleared by the [place] FDA and  has been authorized for detection and/or diagnosis of SARS-CoV-2 by  FDA under an Emergency Use Authorization (EUA). This EUA will remain  in effect (meaning this test can be used) for the duration of the  Covid-19 declaration under Section 564(b)(1) of the Act, 21  U.S.C. section 360bbb-3(b)(1), unless the authorization is  terminated or revoked. Performed at [facility], [address] [place], [place] [address]   MRSA PCR Screening     Status: None   Collection Time: 12/12/19 11:13 AM   Specimen: Nasopharyngeal  Result Value Ref Range Status   MRSA by PCR NEGATIVE NEGATIVE Final    Comment:        The GeneXpert MRSA Assay (FDA approved for NASAL specimens only), is one component of a comprehensive MRSA colonization surveillance program. It is not intended to diagnose MRSA infection nor to guide or monitor treatment for MRSA infections. Performed at [facility], [address] [place], [place] [address]      Studies: CT T-SPINE NO CHARGE  Result Date: 12/13/2019 CLINICAL DATA:  69 year old female with chest and back pain. History of breast cancer. EXAM: CT Thoracic Spine WITH Contrast TECHNIQUE: Multiplanar CT images of the thoracic spine were reconstructed from contemporary CTA of the (12/11/2019). CONTRAST:  No additional COMPARISON:  CTA chest, Abdomen, and Pelvis 12/11/2019. Thoracic spine MRI  12/09/2010. FINDINGS: Limited cervical spine imaging: Cervicothoracic junction alignment is within normal limits. Thoracic spine segmentation: There appear to be hypoplastic ribs at L1, otherwise normal thoracic segmentation. Alignment:  Thoracic kyphosis is within normal limits. Vertebrae: No acute osseous abnormality identified. Paraspinal and other soft tissues: Large area of right lower lobe pulmonary consolidation as reported separately. Additional chest and abdomen findings per CTA. There is a thoracic pain pump or similar related catheter which enters the spinal canal from a right  T12-L1 interlaminar approach and courses cephalad in the right lateral recess terminating at the superior T7 level on sagittal image 15. This was present in 2012. Otherwise thoracic paraspinal soft tissues remain within normal limits. Disc levels: As on the 2012 MRI no age advanced thoracic spine degeneration is identified. Fairly capacious thoracic spinal canal. No CT evidence of thoracic spinal stenosis. IMPRESSION: 1. No acute osseous abnormality or age advanced thoracic spine degeneration identified. 2. Chronic thoracic spine intrathecal catheter, grossly stable since 2012. 3. Large Right Lower Lobe Pneumonia and other chest/abdominal findings as per CTA 12/11/2019. Electronically Signed   By: Genevie Ann M.D.   On: 12/13/2019 14:14   CT L-SPINE NO CHARGE  Result Date: 12/13/2019 CLINICAL DATA:  69 year old female with chest and back pain. History of breast cancer. EXAM: CT LUMBAR SPINE WITH CONTRAST TECHNIQUE: Technique: Multiplanar CT images of the lumbar spine were reconstructed from contemporary CTA of the Abdomen and Pelvis (12/11/2019). CONTRAST:  No additional COMPARISON:  CTA chest, Abdomen, and Pelvis 12/11/2019. Lumbar MRI 08/26/2019. Thoracic spine CT today reported separately. FINDINGS: Segmentation: Judging from the thoracic spine numbering today which also demonstrated hypoplastic ribs at L1 there is a fully  lumbarized S1 level. This numbering system differs from that on the January MRI. Correlation with radiographs is recommended prior to any operative intervention. Alignment: Stable lumbar lordosis. Chronic grade 1 anterolisthesis at L5-S1 and S1-S2. subtle anterolisthesis at L4-L5. Vertebrae: No acute osseous abnormality identified. Intact visible sacrum and SI joints. Paraspinal and other soft tissues: The spinal catheter courses from the right flank into the spinal canal posteriorly on the right at T12-L1 as reported separately today. Abdominal and pelvic viscera reported on the CTA separately. Disc levels: L1-L2: Severe disc space loss with mild circumferential disc osteophyte complex. No stenosis. L2-L3:  Mild circumferential disc bulge. No stenosis. L3-L4:  Negative. L4-L5: Subtle anterolisthesis with mild disc bulging and moderate posterior element hypertrophy. No stenosis. L5-S1: Grade 1 anterolisthesis. Mild disc bulge but up to severe facet hypertrophy and vacuum facet phenomena greater on the left. Tiny left paracentral vacuum phenomena on series 14, image 63 may indicated new small left paracentral disc herniation. This is in proximity to the left lateral recess (left S1 nerve level). But there is no significant spinal stenosis. No significant foraminal stenosis. S1-S2: Fully lumbarized S1 level with grade 1 anterolisthesis. Mild disc bulging and severe facet hypertrophy. But no stenosis. IMPRESSION: 1. Transitional lumbosacral anatomy with a fully lumbarized S1 level. This numbering differs from that on the January lumbar MRI and correlation with radiographs is recommended prior to any operative intervention. 2. No acute osseous abnormality in the lumbar spine. The dominant lumbar degenerative finding is severe facet arthropathy in the setting of grade 1 anterolisthesis from L4-L5 through S1-S2. Possible small new left paracentral disc herniation at L5-S1, query left S1 radiculitis. 3. CTA abdomen and  Pelvis reported separately on 12/11/2019. Electronically Signed   By: Genevie Ann M.D.   On: 12/13/2019 14:21    Scheduled Meds: . aspirin EC  81 mg Oral Daily  . atorvastatin  20 mg Oral Daily  . Chlorhexidine Gluconate Cloth  6 each Topical Daily  . cholecalciferol  1,000 Units Oral Daily  . clotrimazole  1 application Topical BID  . enoxaparin (LOVENOX) injection  40 mg Subcutaneous Q24H  . gabapentin  300 mg Oral TID  . insulin aspart  0-15 Units Subcutaneous TID WC  . insulin aspart  0-5 Units Subcutaneous QHS  . insulin glargine  14 Units Subcutaneous QHS  . ipratropium-albuterol  3 mL Nebulization TID  . lisinopril  5 mg Oral Daily  . methocarbamol  500 mg Oral TID  . metoprolol succinate  12.5 mg Oral Daily  . nystatin  5 mL Oral QID  . pantoprazole  40 mg Oral BID  . triamcinolone ointment  1 application Topical BID  . venlafaxine XR  150 mg Oral Daily   Continuous Infusions: . sodium chloride 125 mL/hr at 12/14/19 1236  . ceFEPime (MAXIPIME) IV 2 g (12/14/19 1051)  . [START ON 12/15/2019] fluconazole (DIFLUCAN) IV    . magnesium sulfate bolus IVPB      Assessment/Plan:  1. Severe sepsis secondary to right lobar pneumonia.  Patient was started on vancomycin and cefepime.  Since MRSA PCR negative I will get rid of vancomycin. 2. Thrush in mouth and on tongue.  Will start IV Diflucan and nystatin swish and swallow.  Of note the patient has had 2 courses of Diflucan previously.  May have to refer to ENT as outpatient. 3. Hypomagnesemia replace magnesium IV 4. Acute hypoxic respiratory failure.  Pulse ox 85% with ambulation today.  Will check pulse ox again tomorrow morning with ambulation.  May end up needing oxygen during the day also.  She wears oxygen normally just at night. 5. COPD.  Since the patient has thrush hold off on any steroids.  Patient currently not wheezing. 6. Chronic back pain low back with intrathecal pump. 7. Type II insulin-dependent diabetes mellitus on  Lantus and sliding scale. 8. History of right-sided breast cancer  Code Status:     Code Status Orders  (From admission, onward)         Start     Ordered   12/12/19 0558  Full code  Continuous     12/12/19 0557        Code Status History    Date Active Date Inactive Code Status Order ID Comments User Context   04/25/2017 1615 05/01/2017 1724 Full Code [id number]  Idelle Crouch, MD ED   Advance Care Planning Activity    Advance Directive Documentation     Most Recent Value  Type of Advance Directive  Healthcare Power of Attorney  Pre-existing out of facility DNR order (yellow form or pink MOST form)  --  "MOST" Form in Place?  --     Family Communication: Spoke with daughter at the bedside Disposition Plan: Status is: Inpatient  Dispo: The patient is from: Home              Anticipated d/c is to: Home              Anticipated d/c date is: 12/15/2019              Patient currently requiring oxygen during the day with ambulation.  Trying to get the patient off oxygen totally prior to disposition.  Antibiotics:  Cefepime  Discontinue vancomycin  Add Zithromax  Time spent: 29 minutes  Ellieana Dolecki [employer]

## 2019-12-14 NOTE — Evaluation (Signed)
Physical Therapy Evaluation Patient Details Name: Jordan Chan MRN: [id number] DOB: 04/15/1951 Today's Date: 12/14/2019   History of Present Illness  Pt is a 69 y.o. female presenting to hospital 5/9 with significant pain from mid upper back down through her lower back.  Pt admitted with sepsis secondary PNA, COPD, and chronic pain syndrome.  PMH includes arthritis, asthma, CHF, COPD, DM, htn, HLD, chronic back pain, R breast CA with mastectomy, O2 at night, IBS, scoliosis, TIA, NSTEMI.  Clinical Impression  Prior to hospital admission, pt was independent with ambulation; used O2 at night; lives with her husband.  Currently pt is CGA with transfers; CGA to min assist ambulating (no AD) 20 feet; and CGA to ambulate 40 feet with RW.  Pt appearing unsteady ambulating without UE support but improved balance noted ambulating using RW.  Increased SOB noted with increased distance ambulating (O2 sats 90% on room air after 1st ambulation trial and 89% on room air after 2nd ambulation trial; O2 sats 95% on room air end of session at rest in recliner).  Pt would benefit from skilled PT to address noted impairments and functional limitations (see below for any additional details).  Upon hospital discharge, pt would benefit from [place].    Follow Up Recommendations Home health PT;Supervision for mobility/OOB    Equipment Recommendations  Rolling walker with 5" wheels    Recommendations for Other Services       Precautions / Restrictions Precautions Precautions: Fall Precaution Comments: Implanted morphine pump Restrictions Weight Bearing Restrictions: No      Mobility  Bed Mobility               General bed mobility comments: Deferred (pt sitting up in recliner beginning and end of session)  Transfers Overall transfer level: Needs assistance Equipment used: None;Rolling walker (2 wheeled) Transfers: Sit to/from Stand Sit to Stand: Min guard         General transfer comment: x2  trials standing from recliner without UE support and x1 trial standing with RW (pt appearing steady with RW compared to no UE support)  Ambulation/Gait Ambulation/Gait assistance: Min assist;Min guard Gait Distance (Feet): (20 feet no UE support; 40 feet with RW) Assistive device: None;Rolling walker (2 wheeled)   Gait velocity: decreased   General Gait Details: pt intermittently unsteady ambulating without UE support requiring CGA to min assist to steady; CGA ambulating with RW  Stairs            Wheelchair Mobility    Modified Rankin (Stroke Patients Only)       Balance Overall balance assessment: Needs assistance Sitting-balance support: No upper extremity supported;Feet supported Sitting balance-Leahy Scale: Normal Sitting balance - Comments: steady sitting reaching outside BOS   Standing balance support: No upper extremity supported Standing balance-Leahy Scale: Good Standing balance comment: steady standing brushing her hair                             Pertinent Vitals/Pain Pain Assessment: Faces Faces Pain Scale: Hurts a little bit Pain Location: chronic back pain Pain Descriptors / Indicators: Discomfort Pain Intervention(s): Limited activity within patient's tolerance;Monitored during session;Repositioned  HR 107-121 bpm during sessions activities.    Home Living Family/patient expects to be discharged to:: Private residence Living Arrangements: Spouse/significant other Available Help at Discharge: Family Type of Home: House Home Access: Stairs to enter Entrance Stairs-Rails: Right;Left;Can reach both Entrance Stairs-Number of Steps: 1 Home Layout: One level Home Equipment:  Cane - single point;Walker - 2 wheels;Bedside commode;Grab bars - tub/shower      Prior Function Level of Independence: Independent               Hand Dominance        Extremity/Trunk Assessment   Upper Extremity Assessment Upper Extremity Assessment:  Generalized weakness    Lower Extremity Assessment Lower Extremity Assessment: Generalized weakness       Communication   Communication: No difficulties  Cognition Arousal/Alertness: Awake/alert Behavior During Therapy: WFL for tasks assessed/performed Overall Cognitive Status: Within Functional Limits for tasks assessed                                        General Comments   Nursing cleared pt for participation in physical therapy.  Pt agreeable to PT session.  Pt's daughter present during session (pt's husband stepped out into hallway).    Exercises  gait training   Assessment/Plan    PT Assessment Patient needs continued PT services  PT Problem List Decreased strength;Decreased activity tolerance;Decreased balance;Decreased mobility;Decreased knowledge of use of DME;Decreased knowledge of precautions;Cardiopulmonary status limiting activity;Pain       PT Treatment Interventions DME instruction;Gait training;Stair training;Functional mobility training;Therapeutic activities;Therapeutic exercise;Balance training;Patient/family education    PT Goals (Current goals can be found in the Care Plan section)  Acute Rehab PT Goals Patient Stated Goal: to improve breathing PT Goal Formulation: With patient Time For Goal Achievement: 12/28/19 Potential to Achieve Goals: Fair    Frequency Min 2X/week   Barriers to discharge        Co-evaluation               AM-PAC PT "6 Clicks" Mobility  Outcome Measure Help needed turning from your back to your side while in a flat bed without using bedrails?: None Help needed moving from lying on your back to sitting on the side of a flat bed without using bedrails?: A Little Help needed moving to and from a bed to a chair (including a wheelchair)?: A Little Help needed standing up from a chair using your arms (e.g., wheelchair or bedside chair)?: A Little Help needed to walk in hospital room?: A Little Help  needed climbing 3-5 steps with a railing? : A Little 6 Click Score: 19    End of Session Equipment Utilized During Treatment: Gait belt Activity Tolerance: Patient limited by fatigue Patient left: in chair;with call bell/phone within reach;with chair alarm set;with family/visitor present;Other (comment)(Respiratory present) Nurse Communication: Mobility status;Precautions;Other (comment)(Pt's O2 sats during session) PT Visit Diagnosis: Other abnormalities of gait and mobility (R26.89);Muscle weakness (generalized) (M62.81);Difficulty in walking, not elsewhere classified (R26.2)    Time: 1410-1444 PT Time Calculation (min) (ACUTE ONLY): 34 min   Charges:   PT Evaluation $PT Eval Low Complexity: 1 Low PT Treatments $Gait Training: 8-22 mins       Leitha Bleak, PT 12/14/19, 4:26 PM

## 2019-12-14 NOTE — Progress Notes (Signed)
Pt went to bathroom with daughter and had SOB after. SPO2 was 89%. 2L Oxygen was started for comfort. Patients heart rate was between 120's to 130's but maybe Anxiety related. Ativan PO was given. HR was monitored and MD was notified. Night RN will be notified too.

## 2019-12-14 NOTE — Care Management Important Message (Signed)
Important Message  Patient Details  Name: REJEANNE MADEY MRN: [id number] Date of Birth: 14-Oct-1950   Medicare Important Message Given:  Yes  Initial Medicare IM given by Patient Access Associate on 12/13/2019 at 10:18am.     Dannette Barbara 12/14/2019, 9:11 AM

## 2019-12-15 DIAGNOSIS — K5900 Constipation, unspecified: Secondary | ICD-10-CM

## 2019-12-15 LAB — GLUCOSE, CAPILLARY
Glucose-Capillary: 56 mg/dL — ABNORMAL LOW (ref 70–99)
Glucose-Capillary: 71 mg/dL (ref 70–99)
Glucose-Capillary: 150 mg/dL — ABNORMAL HIGH (ref 70–99)
Glucose-Capillary: 103 mg/dL — ABNORMAL HIGH (ref 70–99)
Glucose-Capillary: 167 mg/dL — ABNORMAL HIGH (ref 70–99)

## 2019-12-15 LAB — CBC WITH DIFFERENTIAL/PLATELET
Abs Immature Granulocytes: 0.3 10*3/uL — ABNORMAL HIGH (ref 0.00–0.07)
Basophils Absolute: 0.1 10*3/uL (ref 0.0–0.1)
Basophils Relative: 1 %
Eosinophils Absolute: 0.7 10*3/uL — ABNORMAL HIGH (ref 0.0–0.5)
Eosinophils Relative: 4 %
HCT: 30.6 % — ABNORMAL LOW (ref 36.0–46.0)
Hemoglobin: 9.7 g/dL — ABNORMAL LOW (ref 12.0–15.0)
Immature Granulocytes: 2 %
Lymphocytes Relative: 14 %
Lymphs Abs: 2.3 10*3/uL (ref 0.7–4.0)
MCH: 26.1 pg (ref 26.0–34.0)
MCHC: 31.7 g/dL (ref 30.0–36.0)
MCV: 82.3 fL (ref 80.0–100.0)
Monocytes Absolute: 1.9 10*3/uL — ABNORMAL HIGH (ref 0.1–1.0)
Monocytes Relative: 12 %
Neutro Abs: 10.7 10*3/uL — ABNORMAL HIGH (ref 1.7–7.7)
Neutrophils Relative %: 67 %
Platelets: 375 10*3/uL (ref 150–400)
RBC: 3.72 MIL/uL — ABNORMAL LOW (ref 3.87–5.11)
RDW: 17.2 % — ABNORMAL HIGH (ref 11.5–15.5)
WBC: 15.9 10*3/uL — ABNORMAL HIGH (ref 4.0–10.5)
nRBC: 0.1 % (ref 0.0–0.2)

## 2019-12-15 LAB — BASIC METABOLIC PANEL
Anion gap: 7 (ref 5–15)
BUN: 10 mg/dL (ref 8–23)
CO2: 23 mmol/L (ref 22–32)
Calcium: 8.2 mg/dL — ABNORMAL LOW (ref 8.9–10.3)
Chloride: 108 mmol/L (ref 98–111)
Creatinine, Ser: 0.61 mg/dL (ref 0.44–1.00)
GFR calc Af Amer: >60 mL/min (ref >60)
GFR calc non Af Amer: >60 mL/min (ref >60)
Glucose, Bld: 89 mg/dL (ref 70–99)
Potassium: 3.1 mmol/L — ABNORMAL LOW (ref 3.5–5.1)
Sodium: 138 mmol/L (ref 135–145)

## 2019-12-15 MED ORDER — FUROSEMIDE 10 MG/ML IJ SOLN
40.0000 mg | Freq: Once | INTRAMUSCULAR | Status: AC
  Administered 2019-12-15: 40 mg via INTRAVENOUS
  Filled 2019-12-15: qty 4

## 2019-12-15 MED ORDER — POTASSIUM CHLORIDE CRYS ER 20 MEQ PO TBCR
40.0000 meq | EXTENDED_RELEASE_TABLET | Freq: Once | ORAL | Status: AC
  Administered 2019-12-15: 40 meq via ORAL
  Filled 2019-12-15: qty 2

## 2019-12-15 MED ORDER — GLUCERNA PO LIQD
237.0000 mL | Freq: Once | ORAL | Status: AC
  Administered 2019-12-15: 237 mL via ORAL
  Filled 2019-12-15: qty 237

## 2019-12-15 MED ORDER — METOPROLOL SUCCINATE ER 25 MG PO TB24: 25.0000 mg | ORAL_TABLET | Freq: Every day | ORAL | Status: DC

## 2019-12-15 MED ORDER — GLUCERNA PO LIQD
237.0000 mL | Freq: Every day | ORAL | Status: DC
  Administered 2019-12-15 – 2019-12-19 (×5): 237 mL via ORAL
  Filled 2019-12-15: qty 237

## 2019-12-15 MED ORDER — METHYLNALTREXONE BROMIDE 12 MG/0.6ML ~~LOC~~ SOLN
6.0000 mg | Freq: Once | SUBCUTANEOUS | Status: AC
  Administered 2019-12-15: 6 mg via SUBCUTANEOUS
  Filled 2019-12-15: qty 0.6

## 2019-12-15 MED ORDER — METOPROLOL SUCCINATE ER 50 MG PO TB24
50.0000 mg | ORAL_TABLET | Freq: Every day | ORAL | Status: DC
  Administered 2019-12-15 – 2019-12-16 (×2): 50 mg via ORAL
  Filled 2019-12-15 (×3): qty 1

## 2019-12-15 MED ORDER — INSULIN GLARGINE 100 UNIT/ML ~~LOC~~ SOLN
8.0000 [IU] | Freq: Every day | SUBCUTANEOUS | Status: DC
  Administered 2019-12-15 – 2019-12-19 (×5): 8 [IU] via SUBCUTANEOUS
  Filled 2019-12-15 (×6): qty 0.08

## 2019-12-15 NOTE — Progress Notes (Signed)
Inpatient Diabetes Program Recommendations  AACE/ADA: New Consensus Statement on Inpatient Glycemic Control (2015)  Target Ranges:  Prepandial:   less than 140 mg/dL      Peak postprandial:   less than 180 mg/dL (1-2 hours)      Critically ill patients:  140 - 180 mg/dL   Lab Results  Component Value Date   GLUCAP 71 12/15/2019   HGBA1C 7.1 (H) 12/12/2019    Review of Glycemic Control  Diabetes history: DM 2 Outpatient Diabetes medications: Tresiba 30 units, Trulicity 1.5 mg Weekly, metformin 1000 mg bid Current orders for Inpatient glycemic control:  Lantus 8 units qhs Novolog 0-15 units tid + hs  A1c 7.1% on 5/10  Inpatient Diabetes Program Recommendations:    Hypoglycemia 56 this am. Last dose of PO prednisone 5/11. Lantus decreased from 14 units to 8 qhs. Watch trends.  May also consider decreasing Novolog Correction if glucose drops again.  Thanks,  Tama Headings RN, MSN, BC-ADM Inpatient Diabetes Coordinator Team Pager [phone] (8a-5p)

## 2019-12-15 NOTE — TOC Initial Note (Signed)
Transition of Care [facility]) - Initial/Assessment Note    Patient Details  Name: Jordan Chan MRN: [id number] Date of Birth: 29-Sep-1950  Transition of [facility] CM/SW Contact:    Beverly Sessions, RN Phone Number: 12/15/2019, 11:21 AM  Clinical Narrative:                 Patient admitted from home with sepsis Husband at bedside.  Patient states she does not live with her husband.  Lives at home with her son  PCP - [facility] Patient states that at baseline she drives herself, and denies any issues with transportation  [place].  Denies any issues obtaining medications  Patient states that she has a cane, RW, BSC and nocturnal O2 at home.  O2 is through [place].   At this time patient is short of breath during my assessment.  She has been unable to wean from continuous O2  PT has assessed patient and recommendations are for home health PT.  Patient would benefit home health RN as well.  Patient states she does not have a preference of home health agency.  Referral made to Cindie at [facility].    Expected Discharge Plan: [place]     Patient Goals and CMS Choice        Expected Discharge Plan and Services Expected Discharge Plan: [place]   Discharge Planning Services: CM Consult   Living arrangements for the past 2 months: Single Family Home                           HH Arranged: RN, PT [facility] Agency: [place] Date [facility] Agency Contacted: 12/15/19 Time HH Agency Contacted: [phone] Representative spoke with at [place]: Tommi Rumps  Prior Living Arrangements/Services Living arrangements for the past 2 months: [place] Lives with:: Adult Children Patient language and need for interpreter reviewed:: Yes Do you feel safe going back to the place where you live?: Yes      Need for Family Participation in Patient Care: Yes (Comment) Care giver support system in place?: Yes (comment) Current home  services: DME Criminal Activity/Legal Involvement Pertinent to Current Situation/Hospitalization: No - Comment as needed  Activities of Daily Living Home Assistive Devices/Equipment: Dentures (specify type), Oxygen, Eyeglasses ADL Screening (condition at time of admission) Patient's cognitive ability adequate to safely complete daily activities?: Yes Is the patient deaf or have difficulty hearing?: No Does the patient have difficulty seeing, even when wearing glasses/contacts?: No Does the patient have difficulty concentrating, remembering, or making decisions?: No Patient able to express need for assistance with ADLs?: Yes Does the patient have difficulty dressing or bathing?: No Independently performs ADLs?: Yes (appropriate for developmental age) Does the patient have difficulty walking or climbing stairs?: No Weakness of Legs: None Weakness of Arms/Hands: None  Permission Sought/Granted                  Emotional Assessment Appearance:: Appears older than stated age     Orientation: : Oriented to Self, Oriented to Place, Oriented to  Time, Oriented to Situation   Psych Involvement: No (comment)  Admission diagnosis:  Sepsis ([place]) [A41.9] Sepsis due to pneumonia ([place]) [J18.9, A41.9] Community acquired pneumonia, unspecified laterality [J18.9] Sepsis, due to unspecified organism, unspecified whether acute organ dysfunction present [facility]) [A41.9] Patient Active Problem List   Diagnosis Date Noted  . Lobar pneumonia ([place])   .  Hypomagnesemia   . Acute respiratory failure with hypoxia ([place])   . Insulin dependent type 2 diabetes mellitus ([place])   . Severe sepsis ([place]) 12/12/2019  . Sepsis ([place]) 12/11/2019  . Community acquired pneumonia 12/11/2019  . Angular cheilitis 11/30/2019  . Vitamin D deficiency 07/17/2019  . Stress-induced cardiomyopathy 07/23/2017  . Type 2 diabetes mellitus with diabetic neuropathy, unspecified ([place]) 06/24/2017  . Non-ST elevation (NSTEMI)  myocardial infarction ([place])   . Presence of intrathecal pump 04/27/2017  . Thrush 12/15/2016  . Advanced care planning/counseling discussion 11/14/2016  . Cervical scoliosis 05/20/2016  . Hypertension associated with diabetes ([place]) 06/18/2015  . B12 deficiency 03/19/2015  . Sleep apnea 11/16/2014  . Allergic rhinitis 11/16/2014  . Malignant neoplasm of right breast ([place]) 11/16/2014  . GERD (gastroesophageal reflux disease) 11/16/2014  . COPD, moderate ([place]) 11/16/2014  . Obesity 11/16/2014  . Depression, major, single episode, in partial remission ([place]) 11/16/2014  . Insomnia 11/16/2014  . Chronic back pain 11/16/2014  . Hyperlipidemia associated with type 2 diabetes mellitus ([place]) 11/16/2014  . Sarcoma, endometrial stromal ([place]) 09/27/2013  . Chronic pain syndrome 09/30/2012   PCP:  [name], NP Pharmacy:   [place], [place] [address] Phone: [phone] Fax: [phone]  [place], [place] - [place] [address] Phone: [phone] Fax: [phone]     Social Determinants of Health (SDOH) Interventions    Readmission Risk Interventions No flowsheet data found.

## 2019-12-15 NOTE — Progress Notes (Signed)
SATURATION QUALIFICATIONS: (This note is used to comply with regulatory documentation for home oxygen)  Patient Saturations on Room Air at Rest = 88%  Patient Saturations on Room Air while Ambulating = 86%  Patient Saturations on 2 Liters of oxygen while Ambulating = 93%  Please briefly explain why patient needs home oxygen: 

## 2019-12-15 NOTE — Progress Notes (Signed)
Physical Therapy Treatment Patient Details Name: Jordan Chan MRN: [id number] DOB: [birth year] Today's Date: 12/15/2019    History of Present Illness Pt is a 69 y.o. female presenting to hospital 5/9 with significant pain from mid upper back down through her lower back.  Pt admitted with sepsis secondary PNA, COPD, and chronic pain syndrome.  PMH includes arthritis, asthma, CHF, COPD, DM, htn, HLD, chronic back pain, R breast CA with mastectomy, O2 at night, IBS, scoliosis, TIA, NSTEMI.    PT Comments    Pt seen ambulating in hall with RN, PT offered to assist to allow RN to manage equipment, and PT to provide CGA/supervision as needed for patient. Pt had a tendency to ambulate with RW outside BOS and with a very forward flexed position. Pt ambulated ~61ft and extended seated rest break taken due to unclear spO2 readings, equipment changed out. Initially able to sit with feet supported and no back support, but fatigued after several minutes, PT provided trunk support. Ambulated additional 41ft to assess oxygen requirements. No unsteadiness noted, but cueing for upright posture and ambulating with RW inside BOS needed. spO2 83% on room air, >90% on 2L via [place]. Pt up in chair at end of session, all needs in reach.     Follow Up Recommendations  Home health PT;Supervision for mobility/OOB     Equipment Recommendations  Rolling walker with 5" wheels    Recommendations for Other Services       Precautions / Restrictions Precautions Precautions: Fall Precaution Comments: Implanted morphine pump Restrictions Weight Bearing Restrictions: No    Mobility  Bed Mobility               General bed mobility comments: deferred, pt up in hallway with RN ambulating  Transfers Overall transfer level: Needs assistance Equipment used: Rolling walker (2 wheeled) Transfers: Sit to/from Stand Sit to Stand: Supervision         General transfer comment: Pt needed cueing for RW management  and positioning to maximize safety  Ambulation/Gait Ambulation/Gait assistance: Min guard Gait Distance (Feet): 100 Feet Assistive device: Rolling walker (2 wheeled)       General Gait Details: Pt ambulated ~22ft and extended seated rest break taken due to unclear spO2 readings, equipment changed out. Ambulated additional 72ft to assess oxygen requirements. No unsteadiness noted, but cueing for upright posture and ambulating with RW inside BOS needed.   Stairs             Wheelchair Mobility    Modified Rankin (Stroke Patients Only)       Balance Overall balance assessment: Needs assistance Sitting-balance support: Feet supported Sitting balance-Leahy Scale: Good       Standing balance-Leahy Scale: Good                              Cognition Arousal/Alertness: Awake/alert Behavior During Therapy: Anxious                                   General Comments: pt emotional intermittently during session      Exercises      General Comments        Pertinent Vitals/Pain Pain Assessment: No/denies pain    Home Living                      Prior Function  PT Goals (current goals can now be found in the care plan section) Progress towards PT goals: Progressing toward goals    Frequency    Min 2X/week      PT Plan Current plan remains appropriate    Co-evaluation              AM-PAC PT "6 Clicks" Mobility   Outcome Measure  Help needed turning from your back to your side while in a flat bed without using bedrails?: None Help needed moving from lying on your back to sitting on the side of a flat bed without using bedrails?: None Help needed moving to and from a bed to a chair (including a wheelchair)?: None Help needed standing up from a chair using your arms (e.g., wheelchair or bedside chair)?: None Help needed to walk in hospital room?: None Help needed climbing 3-5 steps with a railing? : A  Little 6 Click Score: 23    End of Session Equipment Utilized During Treatment: Gait belt Activity Tolerance: Patient limited by fatigue Patient left: in chair;with call bell/phone within reach;with family/visitor present Nurse Communication: Mobility status;Precautions;Other (comment) PT Visit Diagnosis: Other abnormalities of gait and mobility (R26.89);Muscle weakness (generalized) (M62.81);Difficulty in walking, not elsewhere classified (R26.2)     Time: [id number] PT Time Calculation (min) (ACUTE ONLY): 18 min  Charges:  $Gait Training: 8-22 mins                    Lieutenant Diego PT, DPT 4:20 PM,12/15/19

## 2019-12-15 NOTE — Plan of Care (Signed)
Pt. has moved bowels several hours after relistor was given. Poor appetite. CBG dropped to below 65 before breakfast. Pt continues to c/o tongue sore. MD aware of  complaints.  Continue treatment.

## 2019-12-15 NOTE — Progress Notes (Signed)
[name], female   DOB: 1950/09/26, 69 y.o.   MRN: [id number] [facility] PROGRESS NOTE  LARKEN SMOLEY [id number] DOB: 11-26-1950 DOA: 12/11/2019 PCP: [name], NP  HPI/Subjective: Seen this morning and she did not feel well with regards to her breathing.  Still coughing and short of breath.  I reevaluated her this afternoon and she felt like she is suffocating and she cannot breathe.  Patient always has back pain but no different than previous.  She states that her mouth is still bothersome.  Objective: Vitals:   12/15/19 0724 12/15/19 1138  BP:  (!) 120/54  Pulse: 92 99  Resp: 18 20  Temp:  98.5 F (36.9 C)  SpO2: 97% 95%    Intake/Output Summary (Last 24 hours) at 12/15/2019 1353 Last data filed at 12/15/2019 0900 Gross per 24 hour  Intake 900 ml  Output --  Net 900 ml   Filed Weights   12/11/19 1411 12/12/19 1648 12/13/19 0448  Weight: 64.4 kg 65.5 kg 66 kg    ROS: Review of Systems  Constitutional: Negative for fever.  Eyes: Negative for blurred vision.  Respiratory: Positive for cough and shortness of breath.   Cardiovascular: Negative for chest pain.  Gastrointestinal: Positive for constipation. Negative for abdominal pain, diarrhea, nausea and vomiting.  Genitourinary: Negative for dysuria.  Musculoskeletal: Positive for back pain. Negative for joint pain.  Neurological: Negative for dizziness.   Exam: Physical Exam  Constitutional: She is oriented to person, place, and time.  HENT:  Nose: No mucosal edema.  Mouth/Throat: No oropharyngeal exudate or posterior oropharyngeal edema.  Thrush in the mouth bilaterally and on tongue.  Eyes: Conjunctivae and lids are normal.  Neck: Carotid bruit is not present.  Cardiovascular: S1 normal and S2 normal. Exam reveals no gallop.  No murmur heard. Respiratory: No respiratory distress. She has decreased breath sounds in the right middle field, the right lower field, the left  middle field and the left lower field. She has no wheezes. She has rhonchi in the right lower field and the left lower field. She has no rales.  GI: Soft. Bowel sounds are normal. There is no abdominal tenderness.  Musculoskeletal:     Right ankle: No swelling.     Left ankle: No swelling.  Lymphadenopathy:    She has no cervical adenopathy.  Neurological: She is alert and oriented to person, place, and time. No cranial nerve deficit.  Skin: Skin is warm. No rash noted. Nails show no clubbing.  Psychiatric: She has a normal mood and affect.      Data Reviewed: Basic Metabolic Panel: Recent Labs  Lab 12/11/19 1405 12/12/19 0738 12/13/19 0607 12/14/19 0415 12/15/19 0617  NA 129* 133* 138 137 138  K 4.5 3.3* 3.5 3.8 3.1*  CL 95* 102 110 112* 108  CO2 20* 24 22 21* 23  GLUCOSE 121* 175* 116* 165* 89  BUN 29* 19 21 15 10   CREATININE 1.04* 0.62 0.53 0.60 0.61  CALCIUM 8.3* 8.1* 8.1* 8.0* 8.2*  MG  --  1.4*  --  1.7  --    Liver Function Tests: Recent Labs  Lab 12/12/19 0738  AST 17  ALT 11  ALKPHOS 69  BILITOT 0.7  PROT 6.8  ALBUMIN 2.6*   CBC: Recent Labs  Lab 12/11/19 1405 12/12/19 0738 12/13/19 0607 12/14/19 0415 12/15/19 0617  WBC 34.2* 27.3* 22.5* 19.1* 15.9*  NEUTROABS  --  22.8* 19.8* 16.2* 10.7*  HGB 11.7* 10.7*  9.0* 8.0* 9.7*  HCT 36.2 33.5* 27.7* 25.7* 30.6*  MCV 82.6 83.5 81.2 85.1 82.3  PLT 419* 459* 398 370 375   BNP (last 3 results) Recent Labs    12/12/19 0738  BNP 546.0*    CBG: Recent Labs  Lab 12/14/19 1648 12/14/19 2140 12/15/19 0753 12/15/19 0830 12/15/19 1135  GLUCAP 127* 152* 56* 71 150*    Recent Results (from the past 240 hour(s))  Blood Culture (routine x 2)     Status: None (Preliminary result)   Collection Time: 12/11/19  2:05 PM   Specimen: BLOOD  Result Value Ref Range Status   Specimen Description BLOOD R UPPER ARM  Final   Special Requests   Final    BOTTLES DRAWN AEROBIC AND ANAEROBIC Blood Culture adequate  volume   Culture   Final    NO GROWTH 4 DAYS Performed at [facility], [address], [place], [place] [address]    Report Status PENDING  Incomplete  Blood Culture (routine x 2)     Status: None (Preliminary result)   Collection Time: 12/11/19  2:06 PM   Specimen: BLOOD  Result Value Ref Range Status   Specimen Description BLOOD RIGHT ANTECUBITAL  Final   Special Requests   Final    BOTTLES DRAWN AEROBIC AND ANAEROBIC Blood Culture results may not be optimal due to an inadequate volume of blood received in culture bottles   Culture   Final    NO GROWTH 4 DAYS Performed at [facility], [address] [place], [place] [address]    Report Status PENDING  Incomplete  Urine culture     Status: Abnormal   Collection Time: 12/11/19  6:10 PM   Specimen: In/Out Cath Urine  Result Value Ref Range Status   Specimen Description   Final    IN/OUT CATH URINE Performed at [facility], [address] [place], [place] [address]    Special Requests   Final    NONE Performed at [facility], [address] [place], [place] [address]    Culture (A)  Final    2,000 COLONIES/mL STAPHYLOCOCCUS HAEMOLYTICUS 2,000 COLONIES/mL ENTEROCOCCUS FAECALIS 10,000 COLONIES/mL GROUP B STREP(S.AGALACTIAE)ISOLATED TESTING AGAINST S. AGALACTIAE NOT ROUTINELY PERFORMED DUE TO PREDICTABILITY OF AMP/PEN/VAN SUSCEPTIBILITY. Performed at [facility], [place] [address], [place], [place] [address]    Report Status 12/14/2019 FINAL  Final   Organism ID, Bacteria STAPHYLOCOCCUS HAEMOLYTICUS (A)  Final   Organism ID, Bacteria ENTEROCOCCUS FAECALIS (A)  Final      Susceptibility   Enterococcus faecalis - MIC*    AMPICILLIN <=2 SENSITIVE Sensitive     NITROFURANTOIN <=16 SENSITIVE Sensitive     VANCOMYCIN 1 SENSITIVE Sensitive     * 2,000 COLONIES/mL ENTEROCOCCUS FAECALIS   Staphylococcus haemolyticus - MIC*    CIPROFLOXACIN <=0.5 SENSITIVE Sensitive     GENTAMICIN  <=0.5 SENSITIVE Sensitive     NITROFURANTOIN <=16 SENSITIVE Sensitive     OXACILLIN <=0.25 SENSITIVE Sensitive     TETRACYCLINE <=1 SENSITIVE Sensitive     VANCOMYCIN <=0.5 SENSITIVE Sensitive     TRIMETH/SULFA <=10 SENSITIVE Sensitive     CLINDAMYCIN >=8 RESISTANT Resistant     RIFAMPIN <=0.5 SENSITIVE Sensitive     Inducible Clindamycin NEGATIVE Sensitive     * 2,000 COLONIES/mL STAPHYLOCOCCUS HAEMOLYTICUS  Respiratory Panel by RT PCR (Flu A&B, Covid) - Nasopharyngeal Swab     Status: None   Collection Time: 12/11/19  6:10 PM  Specimen: Nasopharyngeal Swab  Result Value Ref Range Status   SARS Coronavirus 2 by RT PCR NEGATIVE NEGATIVE Final    Comment: (NOTE) SARS-CoV-2 target nucleic acids are NOT DETECTED. The SARS-CoV-2 RNA is generally detectable in upper respiratoy specimens during the acute phase of infection. The lowest concentration of SARS-CoV-2 viral copies this assay can detect is 131 copies/mL. A negative result does not preclude SARS-Cov-2 infection and should not be used as the sole basis for treatment or other patient management decisions. A negative result may occur with  improper specimen collection/handling, submission of specimen other than nasopharyngeal swab, presence of viral mutation(s) within the areas targeted by this assay, and inadequate number of viral copies (<131 copies/mL). A negative result must be combined with clinical observations, patient history, and epidemiological information. The expected result is Negative. Fact Sheet for Patients:  PinkCheek.be Fact Sheet for Healthcare Providers:  GravelBags.it This test is not yet ap proved or cleared by the [place] FDA and  has been authorized for detection and/or diagnosis of SARS-CoV-2 by FDA under an Emergency Use Authorization (EUA). This EUA will remain  in effect (meaning this test can be used) for the duration of the COVID-19  declaration under Section 564(b)(1) of the Act, 21 U.S.C. section 360bbb-3(b)(1), unless the authorization is terminated or revoked sooner.    Influenza A by PCR NEGATIVE NEGATIVE Final   Influenza B by PCR NEGATIVE NEGATIVE Final    Comment: (NOTE) The Xpert Xpress SARS-CoV-2/FLU/RSV assay is intended as an aid in  the diagnosis of influenza from Nasopharyngeal swab specimens and  should not be used as a sole basis for treatment. Nasal washings and  aspirates are unacceptable for Xpert Xpress SARS-CoV-2/FLU/RSV  testing. Fact Sheet for Patients: PinkCheek.be Fact Sheet for Healthcare Providers: GravelBags.it This test is not yet approved or cleared by the [place] FDA and  has been authorized for detection and/or diagnosis of SARS-CoV-2 by  FDA under an Emergency Use Authorization (EUA). This EUA will remain  in effect (meaning this test can be used) for the duration of the  Covid-19 declaration under Section 564(b)(1) of the Act, 21  U.S.C. section 360bbb-3(b)(1), unless the authorization is  terminated or revoked. Performed at [facility], [address] [place], [place] [address]   MRSA PCR Screening     Status: None   Collection Time: 12/12/19 11:13 AM   Specimen: Nasopharyngeal  Result Value Ref Range Status   MRSA by PCR NEGATIVE NEGATIVE Final    Comment:        The GeneXpert MRSA Assay (FDA approved for NASAL specimens only), is one component of a comprehensive MRSA colonization surveillance program. It is not intended to diagnose MRSA infection nor to guide or monitor treatment for MRSA infections. Performed at [facility], [address] [place], [place] [address]       Scheduled Meds: . aspirin EC  81 mg Oral Daily  . atorvastatin  20 mg Oral Daily  . azithromycin  250 mg Oral Daily  . Chlorhexidine Gluconate Cloth  6 each Topical Daily  . cholecalciferol  1,000 Units  Oral Daily  . clotrimazole  1 application Topical BID  . enoxaparin (LOVENOX) injection  40 mg Subcutaneous Q24H  . gabapentin  300 mg Oral TID  . insulin aspart  0-15 Units Subcutaneous TID WC  . insulin aspart  0-5 Units Subcutaneous QHS  . insulin glargine  8 Units Subcutaneous QHS  . ipratropium-albuterol  3 mL Nebulization TID  .  lisinopril  5 mg Oral Daily  . methocarbamol  500 mg Oral TID  . metoprolol succinate  50 mg Oral Daily  . nystatin  5 mL Oral QID  . pantoprazole  40 mg Oral BID  . polyethylene glycol  17 g Oral Daily  . triamcinolone ointment  1 application Topical BID  . venlafaxine XR  150 mg Oral Daily   Continuous Infusions: . ceFEPime (MAXIPIME) IV 2 g (12/15/19 0921)  . fluconazole (DIFLUCAN) IV 100 mg (12/15/19 [id number])    Assessment/Plan:  1. Severe sepsis, present on admission secondary to right lobar pneumonia.  Patient was started on vancomycin and cefepime.  Initially.  Vancomycin discontinued since MRSA PCR is negative.  I added Zithromax yesterday. 2. Acute hypoxic respiratory failure.  Pulse ox dropped down yesterday and again today with ambulation.  Likely will need chronic oxygen at home.  I did give a dose of Lasix this morning. 3. Thrush in mouth and on tongue.  Will start IV Diflucan and nystatin swish and swallow.  Of note the patient has had 2 courses of Diflucan previously.  May have to refer to ENT as outpatient. 4. Hypomagnesemia replaced 5. Hypokalemia replace orally 6. COPD.  Since the patient has thrush hold off on any steroids.  7. Chronic back pain low back with intrathecal pump. 8. Type II insulin-dependent diabetes mellitus on Lantus and sliding scale.  With low sugar this morning will cut back on Lantus dose this evening. 9. History of right-sided breast cancer 10. Tachycardia with ambulation increase Toprol-XL to 50 mg daily 11. Opioid-induced constipation did give a dose of Relistor today.  Will redose if no response.  Code Status:      Code Status Orders  (From admission, onward)         Start     Ordered   12/12/19 0558  Full code  Continuous     12/12/19 0557        Code Status History    Date Active Date Inactive Code Status Order ID Comments User Context   04/25/2017 1615 05/01/2017 1724 Full Code [id number]  Idelle Crouch, MD ED   Advance Care Planning Activity    Advance Directive Documentation     Most Recent Value  Type of Advance Directive  Healthcare Power of Attorney  Pre-existing out of facility DNR order (yellow form or pink MOST form)  --  "MOST" Form in Place?  --     Family Communication: Spoke with daughter at the bedside Disposition Plan: Status is: Inpatient  Dispo: The patient is from: Home              Anticipated d/c is to: Home              Anticipated d/c date is: Potentially 12/16/2019.              Patient currently not feeling well at all.  She feels like she is suffocating.  I did give a dose of IV Lasix this morning.  Upon reevaluation she is still not feeling better this afternoon. Continue to watch overnight and reassess tomorrow for potential disposition  Antibiotics:  Cefepime  po Zithromax  Time spent: 30 minutes  [place]

## 2019-12-16 ENCOUNTER — Inpatient Hospital Stay: Payer: Medicare HMO

## 2019-12-16 DIAGNOSIS — I471 Supraventricular tachycardia, unspecified: Secondary | ICD-10-CM

## 2019-12-16 DIAGNOSIS — K120 Recurrent oral aphthae: Secondary | ICD-10-CM

## 2019-12-16 LAB — CULTURE, BLOOD (ROUTINE X 2)
Culture: NO GROWTH
Culture: NO GROWTH
Special Requests: ADEQUATE

## 2019-12-16 LAB — GLUCOSE, CAPILLARY
Glucose-Capillary: 95 mg/dL (ref 70–99)
Glucose-Capillary: 108 mg/dL — ABNORMAL HIGH (ref 70–99)
Glucose-Capillary: 237 mg/dL — ABNORMAL HIGH (ref 70–99)
Glucose-Capillary: 151 mg/dL — ABNORMAL HIGH (ref 70–99)
Glucose-Capillary: 145 mg/dL — ABNORMAL HIGH (ref 70–99)

## 2019-12-16 LAB — BASIC METABOLIC PANEL
Anion gap: 7 (ref 5–15)
BUN: 11 mg/dL (ref 8–23)
CO2: 29 mmol/L (ref 22–32)
Calcium: 8.1 mg/dL — ABNORMAL LOW (ref 8.9–10.3)
Chloride: 102 mmol/L (ref 98–111)
Creatinine, Ser: 0.52 mg/dL (ref 0.44–1.00)
GFR calc Af Amer: >60 mL/min (ref >60)
GFR calc non Af Amer: >60 mL/min (ref >60)
Glucose, Bld: 113 mg/dL — ABNORMAL HIGH (ref 70–99)
Potassium: 3.1 mmol/L — ABNORMAL LOW (ref 3.5–5.1)
Sodium: 138 mmol/L (ref 135–145)

## 2019-12-16 MED ORDER — POTASSIUM CHLORIDE CRYS ER 20 MEQ PO TBCR
40.0000 meq | EXTENDED_RELEASE_TABLET | Freq: Once | ORAL | Status: AC
  Administered 2019-12-16: 40 meq via ORAL
  Filled 2019-12-16: qty 2

## 2019-12-16 MED ORDER — DILTIAZEM HCL ER COATED BEADS 120 MG PO CP24
120.0000 mg | ORAL_CAPSULE | Freq: Every day | ORAL | Status: DC
  Administered 2019-12-16 – 2019-12-18 (×3): 120 mg via ORAL
  Filled 2019-12-16 (×3): qty 1

## 2019-12-16 MED ORDER — TRAZODONE HCL 50 MG PO TABS
50.0000 mg | ORAL_TABLET | Freq: Every day | ORAL | Status: DC
  Administered 2019-12-16: 50 mg via ORAL
  Filled 2019-12-16: qty 1

## 2019-12-16 MED ORDER — LIDOCAINE VISCOUS HCL 2 % MT SOLN
15.0000 mL | OROMUCOSAL | Status: DC | PRN
  Administered 2019-12-16 – 2019-12-19 (×7): 15 mL via OROMUCOSAL
  Filled 2019-12-16 (×10): qty 15

## 2019-12-16 MED ORDER — FUROSEMIDE 10 MG/ML IJ SOLN
40.0000 mg | Freq: Once | INTRAMUSCULAR | Status: AC
  Administered 2019-12-16: 40 mg via INTRAVENOUS
  Filled 2019-12-16: qty 4

## 2019-12-16 NOTE — Progress Notes (Signed)
[name], female   DOB: January 12, 1951, 69 y.o.   MRN: [id number] [facility] PROGRESS NOTE  Jordan Chan [id number] DOB: 10/28/1950 DOA: 12/11/2019 PCP: [name], NP  HPI/Subjective: Patient still not feeling very well.  Has been sleeping in the chair.  When she walks she gets very fatigued and short of breath and feels like she cannot breathe.  Her tongue is painful and inside of the mouth is painful in the back of the throat is painful.  Objective: Vitals:   12/16/19 1225 12/16/19 1325  BP:    Pulse:    Resp: 20   Temp:    SpO2:  100%    Intake/Output Summary (Last 24 hours) at 12/16/2019 1400 Last data filed at 12/16/2019 1031 Gross per 24 hour  Intake 540.03 ml  Output 800 ml  Net -259.97 ml   Filed Weights   12/11/19 1411 12/12/19 1648 12/13/19 0448  Weight: 64.4 kg 65.5 kg 66 kg    ROS: Review of Systems  Constitutional: Positive for malaise/fatigue. Negative for fever.  Eyes: Negative for blurred vision.  Respiratory: Positive for cough and shortness of breath.   Cardiovascular: Negative for chest pain.  Gastrointestinal: Negative for abdominal pain, constipation, diarrhea, nausea and vomiting.  Genitourinary: Negative for dysuria.  Musculoskeletal: Positive for back pain. Negative for joint pain.  Neurological: Negative for dizziness.   Exam: Physical Exam  Constitutional: She is oriented to person, place, and time.  HENT:  Nose: No mucosal edema.  Mouth/Throat: No oropharyngeal exudate or posterior oropharyngeal edema.  Aphthous ulcer left cheek.  Looks like aphthous ulcers on the tip of the tongue.  Back of the mouth is erythematous and has whitish thrush on it.  Tongue thrush is better than previous.  Eyes: Conjunctivae and lids are normal.  Neck: Carotid bruit is not present.  Cardiovascular: S1 normal and S2 normal. Tachycardia present. Exam reveals no gallop.  No murmur heard. Respiratory: No respiratory distress.  She has decreased breath sounds in the right lower field and the left lower field. She has no wheezes. She has rhonchi in the right lower field and the left lower field. She has no rales.  GI: Soft. Bowel sounds are normal. There is no abdominal tenderness.  Musculoskeletal:     Right ankle: No swelling.     Left ankle: No swelling.  Lymphadenopathy:    She has no cervical adenopathy.  Neurological: She is alert and oriented to person, place, and time. No cranial nerve deficit.  Skin: Skin is warm. No rash noted. Nails show no clubbing.  Psychiatric: She has a normal mood and affect.      Data Reviewed: Basic Metabolic Panel: Recent Labs  Lab 12/12/19 0738 12/13/19 0607 12/14/19 0415 12/15/19 0617 12/16/19 0537  NA 133* 138 137 138 138  K 3.3* 3.5 3.8 3.1* 3.1*  CL 102 110 112* 108 102  CO2 24 22 21* 23 29  GLUCOSE 175* 116* 165* 89 113*  BUN 19 21 15 10 11   CREATININE 0.62 0.53 0.60 0.61 0.52  CALCIUM 8.1* 8.1* 8.0* 8.2* 8.1*  MG 1.4*  --  1.7  --   --    Liver Function Tests: Recent Labs  Lab 12/12/19 0738  AST 17  ALT 11  ALKPHOS 69  BILITOT 0.7  PROT 6.8  ALBUMIN 2.6*   CBC: Recent Labs  Lab 12/11/19 1405 12/12/19 0738 12/13/19 0607 12/14/19 0415 12/15/19 0617  WBC 34.2* 27.3* 22.5* 19.1* 15.9*  NEUTROABS  --  22.8* 19.8* 16.2* 10.7*  HGB 11.7* 10.7* 9.0* 8.0* 9.7*  HCT 36.2 33.5* 27.7* 25.7* 30.6*  MCV 82.6 83.5 81.2 85.1 82.3  PLT 419* 459* 398 370 375   BNP (last 3 results) Recent Labs    12/12/19 0738  BNP 546.0*    CBG: Recent Labs  Lab 12/15/19 1637 12/15/19 2108 12/16/19 0553 12/16/19 0728 12/16/19 1129  GLUCAP 103* 167* 95 108* 237*        Scheduled Meds: . aspirin EC  81 mg Oral Daily  . atorvastatin  20 mg Oral Daily  . azithromycin  250 mg Oral Daily  . cholecalciferol  1,000 Units Oral Daily  . clotrimazole  1 application Topical BID  . diltiazem  120 mg Oral Daily  . enoxaparin (LOVENOX) injection  40 mg  Subcutaneous Q24H  . gabapentin  300 mg Oral TID  . Glucerna  237 mL Oral Daily  . insulin aspart  0-15 Units Subcutaneous TID WC  . insulin aspart  0-5 Units Subcutaneous QHS  . insulin glargine  8 Units Subcutaneous QHS  . ipratropium-albuterol  3 mL Nebulization TID  . lisinopril  5 mg Oral Daily  . methocarbamol  500 mg Oral TID  . metoprolol succinate  50 mg Oral Daily  . nystatin  5 mL Oral QID  . pantoprazole  40 mg Oral BID  . polyethylene glycol  17 g Oral Daily  . traZODone  50 mg Oral QHS  . triamcinolone ointment  1 application Topical BID  . venlafaxine XR  150 mg Oral Daily     Assessment/Plan:  1. Severe sepsis, present on admission secondary to right lobar pneumonia.  Patient on cefepime and Zithromax 2. Tongue and mouth ulcerations looks like aphthous ulcers and thrush.  On nystatin swish and swallow.  Add viscous lidocaine. 3. Acute hypoxic respiratory failure.  Pulse ox dropped down yesterday and again today with ambulation.  Likely will need chronic oxygen at home.  We will give Lasix daily in the morning. 4. Thrush in mouth and on tongue.  Continue IV Diflucan and nystatin swish and swallow.  Of note the patient has had 2 courses of Diflucan previously.  May have to refer to ENT as outpatient. 5. Hypomagnesemia replaced 6. Hypokalemia replace orally 7. COPD.  Since the patient has thrush hold off on any steroids.  8. Chronic back pain low back with intrathecal pump. 9. Type II insulin-dependent diabetes mellitus on Lantus and sliding scale.  With low sugar this morning will cut back on Lantus dose this evening. 10. History of right-sided breast cancer 11. Episode of SVT with tachycardia with ambulation increase Toprol-XL to 50 mg daily.  Add Cardizem CD. 12. Constipation improved with Relistor injection  Code Status:     Code Status Orders  (From admission, onward)         Start     Ordered   12/12/19 0558  Full code  Continuous     12/12/19 0557         Code Status History    Date Active Date Inactive Code Status Order ID Comments User Context   04/25/2017 1615 05/01/2017 1724 Full Code [id number]  Idelle Crouch, MD ED   Advance Care Planning Activity    Advance Directive Documentation     Most Recent Value  Type of Advance Directive  Healthcare Power of Attorney  Pre-existing out of facility DNR order (yellow form or pink MOST form)  -  "  MOST" Form in Place?  -     Family Communication: Spoke with family at the bedside Disposition Plan: Status is: Inpatient  Dispo: The patient is from: Home              Anticipated d/c is to: Home with home health              Anticipated d/c date is: Evaluating every day for potential discharge.              Patient currently not feeling well.  Now with new tongue ulceration, I am starting viscous lidocaine so hopefully she can eat.  Continue to finish up antibiotic course for pneumonia.  Another dose of Lasix this morning.  Patient declined palliative consultation for symptom management at this time.  We will give a trial of trazodone at night to see if I can get her sleeping better.  Episode of SVT today.  Added Cardizem CD to the patient's metoprolol.  Antibiotics:  Cefepime  po Zithromax  Time spent: 27 minutes  [place]

## 2019-12-16 NOTE — Care Management Important Message (Signed)
Important Message  Patient Details  Name: Jordan Chan MRN: [id number] Date of Birth: 25-Jul-1951   Medicare Important Message Given:  Yes     Dannette Barbara 12/16/2019, 1:54 PM

## 2019-12-16 NOTE — Consult Note (Signed)
Pharmacy Antibiotic Note  Jordan Chan is a 69 y.o. female admitted on 12/11/2019 with sepsis/PNA  Pharmacy has been consulted for Cefepime dosing.   Plan: Continue Cefepime 2g IV every 12 hours for Crcl 53 ml/min   Height: 4\' 10"  (147.3 cm) Weight: 66 kg (145 lb 9.6 oz) IBW/kg (Calculated) : 40.9  Temp (24hrs), Avg:98.8 F (37.1 C), Min:98.7 F (37.1 C), Max:98.9 F (37.2 C)  Recent Labs  Lab 12/11/19 1405 12/11/19 1405 12/11/19 1810 12/11/19 2012 12/12/19 0128 12/12/19 0738 12/13/19 0607 12/14/19 0415 12/15/19 0617 12/16/19 0537  WBC 34.2*  --   --   --   --  27.3* 22.5* 19.1* 15.9*  --   CREATININE 1.04*   < >  --   --   --  0.62 0.53 0.60 0.61 0.52  LATICACIDVEN 3.4*  --  3.2* 4.5* 2.9*  --   --   --   --   --    < > = values in this interval not displayed.    Estimated Creatinine Clearance: 53.3 mL/min (by C-G formula based on SCr of 0.52 mg/dL).    Allergies  Allergen Reactions  . Other Palpitations    IV steroids  . Pain Patch [Menthol] Anaphylaxis  . Fentanyl   . Avelox [Moxifloxacin Hcl In Nacl] Other (See Comments)    Upset stomach  . Doxycycline Diarrhea  . Erythromycin Nausea Only and Other (See Comments)    Can take a Z-Pak just fine Other reaction(s): Other (See Comments) Can take Z-Pak  Can take a Z-Pak just fine  . Moxifloxacin Hcl Other (See Comments)    Upset stomach Upset stomach  . Oxycontin [Oxycodone] Hives  . Ozempic [Semaglutide] Nausea Only    Antimicrobials this admission: 5/9 vancomycin>> 5/12 5/9 Flagyl x1 5/9 cefepime >>  5/12 azithromycin>> 5/12 fluconazole>>   Microbiology results: 5/9  BCx: NG x 5d 5/10 MRSA PCR: neg  5/9 Ucx 2,000 COLONIES/mL STAPHYLOCOCCUS HAEMOLYTICUS   2,000 COLONIES/mL ENTEROCOCCUS FAECALIS  10,000 COLONIES/mL GROUP B STREP(S.AGALACTIAE)ISOLATED  Thank you for allowing pharmacy to be a part of this patient's care.  Noralee Space, PharmD, BCPS Clinical Pharmacist 12/16/2019 2:34  PM

## 2019-12-17 LAB — CBC
HCT: 26.9 % — ABNORMAL LOW (ref 36.0–46.0)
Hemoglobin: 8.5 g/dL — ABNORMAL LOW (ref 12.0–15.0)
MCH: 26.2 pg (ref 26.0–34.0)
MCHC: 31.6 g/dL (ref 30.0–36.0)
MCV: 82.8 fL (ref 80.0–100.0)
Platelets: 457 10*3/uL — ABNORMAL HIGH (ref 150–400)
RBC: 3.25 MIL/uL — ABNORMAL LOW (ref 3.87–5.11)
RDW: 16.7 % — ABNORMAL HIGH (ref 11.5–15.5)
WBC: 11.7 10*3/uL — ABNORMAL HIGH (ref 4.0–10.5)
nRBC: 0.2 % (ref 0.0–0.2)

## 2019-12-17 LAB — BASIC METABOLIC PANEL
Anion gap: 8 (ref 5–15)
BUN: 16 mg/dL (ref 8–23)
CO2: 31 mmol/L (ref 22–32)
Calcium: 8.1 mg/dL — ABNORMAL LOW (ref 8.9–10.3)
Chloride: 99 mmol/L (ref 98–111)
Creatinine, Ser: 0.64 mg/dL (ref 0.44–1.00)
GFR calc Af Amer: >60 mL/min (ref >60)
GFR calc non Af Amer: >60 mL/min (ref >60)
Glucose, Bld: 142 mg/dL — ABNORMAL HIGH (ref 70–99)
Potassium: 3.7 mmol/L (ref 3.5–5.1)
Sodium: 138 mmol/L (ref 135–145)

## 2019-12-17 LAB — GLUCOSE, CAPILLARY
Glucose-Capillary: 134 mg/dL — ABNORMAL HIGH (ref 70–99)
Glucose-Capillary: 146 mg/dL — ABNORMAL HIGH (ref 70–99)
Glucose-Capillary: 159 mg/dL — ABNORMAL HIGH (ref 70–99)
Glucose-Capillary: 134 mg/dL — ABNORMAL HIGH (ref 70–99)

## 2019-12-17 LAB — MAGNESIUM: Magnesium: 1.3 mg/dL — ABNORMAL LOW (ref 1.7–2.4)

## 2019-12-17 MED ORDER — ACETAMINOPHEN 325 MG PO TABS
650.0000 mg | ORAL_TABLET | Freq: Four times a day (QID) | ORAL | Status: DC | PRN
  Administered 2019-12-17 (×2): 650 mg via ORAL
  Filled 2019-12-17 (×2): qty 2

## 2019-12-17 MED ORDER — BENZOCAINE 10 % MT GEL
Freq: Four times a day (QID) | OROMUCOSAL | Status: DC | PRN
  Administered 2019-12-17: 1 via OROMUCOSAL
  Filled 2019-12-17: qty 9

## 2019-12-17 MED ORDER — QUETIAPINE FUMARATE 25 MG PO TABS
25.0000 mg | ORAL_TABLET | Freq: Every day | ORAL | Status: DC
  Administered 2019-12-17 – 2019-12-19 (×3): 25 mg via ORAL
  Filled 2019-12-17 (×3): qty 1

## 2019-12-17 MED ORDER — MAGNESIUM SULFATE 4 GM/100ML IV SOLN
4.0000 g | Freq: Once | INTRAVENOUS | Status: AC
  Administered 2019-12-17: 4 g via INTRAVENOUS
  Filled 2019-12-17: qty 100

## 2019-12-17 NOTE — Progress Notes (Signed)
[name], female   DOB: [birth year], 69 y.o.   MRN: [id number] [facility] PROGRESS NOTE  Jordan Chan [id number] DOB: 03/01/1951 DOA: 12/11/2019 PCP: [name], NP  HPI/Subjective: Patient finally got to sleep last night.  She was sleeping this morning the first time I put my head in the room.  The second time I came by I woke her up and was able to talk with her.  She complains of severe tongue pain and unable to eat.  Breathing is still impaired.  Feels weak.  Objective: Vitals:   12/17/19 1356 12/17/19 1429  BP: (!) 92/45   Pulse: 79 83  Resp:  17  Temp:    SpO2: (!) 88% 97%    Intake/Output Summary (Last 24 hours) at 12/17/2019 1550 Last data filed at 12/17/2019 1458 Gross per 24 hour  Intake 451.19 ml  Output 200 ml  Net 251.19 ml   Filed Weights   12/11/19 1411 12/12/19 1648 12/13/19 0448  Weight: 64.4 kg 65.5 kg 66 kg    ROS: Review of Systems  Constitutional: Positive for malaise/fatigue. Negative for fever.  Eyes: Negative for blurred vision.  Respiratory: Positive for cough and shortness of breath.   Cardiovascular: Negative for chest pain.  Gastrointestinal: Negative for abdominal pain, constipation, diarrhea, nausea and vomiting.  Genitourinary: Negative for dysuria.  Musculoskeletal: Positive for back pain. Negative for joint pain.  Neurological: Negative for dizziness.   Exam: Physical Exam  Constitutional: She is oriented to person, place, and time.  HENT:  Nose: No mucosal edema.  Mouth/Throat: No oropharyngeal exudate or posterior oropharyngeal edema.  Aphthous ulcer left cheek.  Looks like aphthous ulcers on the tip of the tongue.  Back of the mouth is erythematous and has whitish thrush on it.  Tongue thrush is better than previous.  Eyes: Conjunctivae and lids are normal.  Neck: Carotid bruit is not present.  Cardiovascular: S1 normal and S2 normal. Tachycardia present. Exam reveals no gallop.  No murmur  heard. Respiratory: No respiratory distress. She has decreased breath sounds in the right lower field and the left lower field. She has no wheezes. She has rhonchi in the right lower field and the left lower field. She has no rales.  GI: Soft. Bowel sounds are normal. There is no abdominal tenderness.  Musculoskeletal:     Right ankle: No swelling.     Left ankle: No swelling.  Lymphadenopathy:    She has no cervical adenopathy.  Neurological: She is alert and oriented to person, place, and time. No cranial nerve deficit.  Skin: Skin is warm. No rash noted. Nails show no clubbing.  Psychiatric: She has a normal mood and affect.      Data Reviewed: Basic Metabolic Panel: Recent Labs  Lab 12/12/19 0738 12/12/19 [id number] 12/13/19 [id number] 12/14/19 0415 12/15/19 0617 12/16/19 0537 12/17/19 0637  NA 133*   < > 138 137 138 138 138  K 3.3*   < > 3.5 3.8 3.1* 3.1* 3.7  CL 102   < > 110 112* 108 102 99  CO2 24   < > 22 21* 23 29 31   GLUCOSE 175*   < > 116* 165* 89 113* 142*  BUN 19   < > 21 15 10 11 16   CREATININE 0.62   < > 0.53 0.60 0.61 0.52 0.64  CALCIUM 8.1*   < > 8.1* 8.0* 8.2* 8.1* 8.1*  MG 1.4*  --   --  1.7  --   --  1.3*   < > = values in this interval not displayed.   Liver Function Tests: Recent Labs  Lab 12/12/19 0738  AST 17  ALT 11  ALKPHOS 69  BILITOT 0.7  PROT 6.8  ALBUMIN 2.6*   CBC: Recent Labs  Lab 12/12/19 0738 12/13/19 0607 12/14/19 0415 12/15/19 0617 12/17/19 0638  WBC 27.3* 22.5* 19.1* 15.9* 11.7*  NEUTROABS 22.8* 19.8* 16.2* 10.7*  --   HGB 10.7* 9.0* 8.0* 9.7* 8.5*  HCT 33.5* 27.7* 25.7* 30.6* 26.9*  MCV 83.5 81.2 85.1 82.3 82.8  PLT 459* 398 370 375 457*   BNP (last 3 results) Recent Labs    12/12/19 0738  BNP 546.0*    CBG: Recent Labs  Lab 12/16/19 1129 12/16/19 1628 12/16/19 2201 12/17/19 0822 12/17/19 1155  GLUCAP 237* 151* 145* 134* 146*        Scheduled Meds: . aspirin EC  81 mg Oral Daily  . atorvastatin  20 mg  Oral Daily  . azithromycin  250 mg Oral Daily  . cholecalciferol  1,000 Units Oral Daily  . clotrimazole  1 application Topical BID  . diltiazem  120 mg Oral Daily  . enoxaparin (LOVENOX) injection  40 mg Subcutaneous Q24H  . gabapentin  300 mg Oral TID  . Glucerna  237 mL Oral Daily  . insulin aspart  0-15 Units Subcutaneous TID WC  . insulin aspart  0-5 Units Subcutaneous QHS  . insulin glargine  8 Units Subcutaneous QHS  . ipratropium-albuterol  3 mL Nebulization TID  . lisinopril  5 mg Oral Daily  . methocarbamol  500 mg Oral TID  . pantoprazole  40 mg Oral BID  . polyethylene glycol  17 g Oral Daily  . QUEtiapine  25 mg Oral QHS  . venlafaxine XR  150 mg Oral Daily     Assessment/Plan:  1. Severe sepsis, present on admission secondary to right lobar pneumonia.  Patient on cefepime and has a couple doses of Zithromax left 2. Tongue and mouth ulcerations looks like aphthous ulcers and thrush.  On nystatin swish and swallow.  Add viscous lidocaine.  I called the pharmacist and added benzocaine gel. 3. Acute hypoxic respiratory failure.  Pulse ox dropped down yesterday and again today with ambulation.  Likely will need chronic oxygen at home 4. Thrush in mouth and on tongue.  Continue IV Diflucan. Of note the patient has had 2 courses of Diflucan previously. May have to refer to ENT as outpatient. 5. Hypomagnesemia.  Replace 4 g IV magnesium today 6. Hypokalemia replace orally 7. COPD.  Since the patient has thrush hold off on any steroids.  8. Chronic back pain low back with intrathecal pump. 9. Type II insulin-dependent diabetes mellitus on Lantus and sliding scale.  With low sugar this morning will cut back on Lantus dose this evening. 10. History of right-sided breast cancer 11. Hypotension secondary to not eating and tachycardia.  Get rid of Toprol-XL and continue Cardizem only. 12. Constipation improved with Relistor injection 13. Anemia.  Check iron studies.  Guaiac stools.   Hold aspirin. 14. Insomnia trial of Seroquel at night  Code Status:     Code Status Orders  (From admission, onward)         Start     Ordered   12/12/19 0558  Full code  Continuous     12/12/19 0557        Code Status History    Date Active Date Inactive Code Status Order ID  Comments User Context   04/25/2017 1615 05/01/2017 1724 Full Code [id number]  Idelle Crouch, MD ED   Advance Care Planning Activity    Advance Directive Documentation     Most Recent Value  Type of Advance Directive  Healthcare Power of Attorney  Pre-existing out of facility DNR order (yellow form or pink MOST form)  --  "MOST" Form in Place?  --     Family Communication: Spoke with family at the bedside Disposition Plan: Status is: Inpatient  Dispo: The patient is from: Home              Anticipated d/c is to: Home with home health              Anticipated d/c date is: Patient not doing well at all.  Have been trying to evaluate on a daily basis on when to go home but not eating at this point.              Patient currently not eating secondary to severe mouth pain and tongue pain which looks like aphthous ulcers and thrush.  Patient on IV Diflucan.  Antibiotics:  Cefepime  po Zithromax  Diflucan IV  Time spent: 27 minutes  [place]

## 2019-12-18 LAB — CBC
HCT: 24.4 % — ABNORMAL LOW (ref 36.0–46.0)
Hemoglobin: 7.6 g/dL — ABNORMAL LOW (ref 12.0–15.0)
MCH: 26.2 pg (ref 26.0–34.0)
MCHC: 31.1 g/dL (ref 30.0–36.0)
MCV: 84.1 fL (ref 80.0–100.0)
Platelets: 438 10*3/uL — ABNORMAL HIGH (ref 150–400)
RBC: 2.9 MIL/uL — ABNORMAL LOW (ref 3.87–5.11)
RDW: 16.6 % — ABNORMAL HIGH (ref 11.5–15.5)
WBC: 9.9 10*3/uL (ref 4.0–10.5)
nRBC: 0 % (ref 0.0–0.2)

## 2019-12-18 LAB — BASIC METABOLIC PANEL
Anion gap: 8 (ref 5–15)
BUN: 13 mg/dL (ref 8–23)
CO2: 31 mmol/L (ref 22–32)
Calcium: 8 mg/dL — ABNORMAL LOW (ref 8.9–10.3)
Chloride: 100 mmol/L (ref 98–111)
Creatinine, Ser: 0.54 mg/dL (ref 0.44–1.00)
GFR calc Af Amer: >60 mL/min (ref >60)
GFR calc non Af Amer: >60 mL/min (ref >60)
Glucose, Bld: 139 mg/dL — ABNORMAL HIGH (ref 70–99)
Potassium: 3.7 mmol/L (ref 3.5–5.1)
Sodium: 139 mmol/L (ref 135–145)

## 2019-12-18 LAB — GLUCOSE, CAPILLARY
Glucose-Capillary: 88 mg/dL (ref 70–99)
Glucose-Capillary: 137 mg/dL — ABNORMAL HIGH (ref 70–99)
Glucose-Capillary: 179 mg/dL — ABNORMAL HIGH (ref 70–99)
Glucose-Capillary: 300 mg/dL — ABNORMAL HIGH (ref 70–99)

## 2019-12-18 LAB — FERRITIN: Ferritin: 73 ng/mL (ref 11–307)

## 2019-12-18 LAB — RETIC PANEL
Immature Retic Fract: 40.7 % — ABNORMAL HIGH (ref 2.3–15.9)
RBC.: 2.91 MIL/uL — ABNORMAL LOW (ref 3.87–5.11)
Retic Count, Absolute: 34 10*3/uL (ref 19.0–186.0)
Retic Ct Pct: 1.2 % (ref 0.4–3.1)
Reticulocyte Hemoglobin: 29.9 pg (ref >27.9)

## 2019-12-18 LAB — OCCULT BLOOD X 1 CARD TO LAB, STOOL: Fecal Occult Bld: NEGATIVE

## 2019-12-18 LAB — LACTATE DEHYDROGENASE: LDH: 122 U/L (ref 98–192)

## 2019-12-18 MED ORDER — SODIUM CHLORIDE 0.9 % IV SOLN
400.0000 mg | Freq: Once | INTRAVENOUS | Status: AC
  Administered 2019-12-18: 400 mg via INTRAVENOUS
  Filled 2019-12-18: qty 20

## 2019-12-18 MED ORDER — PREDNISONE 20 MG PO TABS
40.0000 mg | ORAL_TABLET | Freq: Every day | ORAL | Status: DC
  Administered 2019-12-18 – 2019-12-20 (×3): 40 mg via ORAL
  Filled 2019-12-18 (×3): qty 2

## 2019-12-18 MED ORDER — DILTIAZEM HCL ER COATED BEADS 180 MG PO CP24
180.0000 mg | ORAL_CAPSULE | Freq: Every day | ORAL | Status: DC
  Administered 2019-12-19 – 2019-12-20 (×2): 180 mg via ORAL
  Filled 2019-12-18 (×2): qty 1

## 2019-12-18 MED ORDER — MAGIC MOUTHWASH
15.0000 mL | Freq: Every day | ORAL | Status: DC
  Administered 2019-12-18 – 2019-12-20 (×3): 15 mL via ORAL
  Filled 2019-12-18: qty 15
  Filled 2019-12-18: qty 20
  Filled 2019-12-18: qty 15

## 2019-12-18 MED ORDER — PREDNISONE 20 MG PO TABS: 40.0000 mg | ORAL_TABLET | Freq: Every day | ORAL | Status: DC

## 2019-12-18 NOTE — Progress Notes (Signed)
[name], female   DOB: 1951-04-28, 69 y.o.   MRN: [id number] [facility] PROGRESS NOTE  Jordan Chan [id number] DOB: 13-Aug-1950 DOA: 12/11/2019 PCP: Jordan Lick, NP  HPI/Subjective: Patient still saying that she can eat, she can hardly talk, severe pain in her mouth and tongue.  Complaining of some pain on her right side yesterday.  Some soreness on her buttock.  Objective: Vitals:   12/18/19 0729 12/18/19 1150  BP:  109/71  Pulse: 86 (!) 107  Resp: 14   Temp:  99 F (37.2 C)  SpO2: 96% 96%    Intake/Output Summary (Last 24 hours) at 12/18/2019 1300 Last data filed at 12/18/2019 0529 Gross per 24 hour  Intake 991.19 ml  Output --  Net 991.19 ml   Filed Weights   12/11/19 1411 12/12/19 1648 12/13/19 0448  Weight: 64.4 kg 65.5 kg 66 kg    ROS: Review of Systems  Constitutional: Positive for malaise/fatigue. Negative for fever.  HENT: Positive for sore throat.   Eyes: Negative for blurred vision.  Respiratory: Positive for cough and shortness of breath.   Cardiovascular: Negative for chest pain.  Gastrointestinal: Negative for abdominal pain, constipation, diarrhea, nausea and vomiting.  Genitourinary: Negative for dysuria.  Musculoskeletal: Positive for back pain. Negative for joint pain.  Neurological: Negative for dizziness.   Exam: Physical Exam  Constitutional: She is oriented to person, place, and time.  HENT:  Nose: No mucosal edema.  Mouth/Throat: No oropharyngeal exudate or posterior oropharyngeal edema.  Aphthous ulcer left cheek.  Looks like aphthous ulcers on the tip of the tongue.  Back of the mouth is erythematous and has whitish thrush on it.  Tongue thrush is better than previous.  Eyes: Conjunctivae and lids are normal.  Neck: Carotid bruit is not present.  Cardiovascular: S1 normal and S2 normal. Tachycardia present. Exam reveals no gallop.  No murmur heard. Respiratory: No respiratory distress. She has  decreased breath sounds in the right lower field and the left lower field. She has no wheezes. She has rhonchi in the right lower field and the left lower field. She has no rales.  GI: Soft. Bowel sounds are normal. There is no abdominal tenderness.  Musculoskeletal:     Right ankle: No swelling.     Left ankle: No swelling.  Lymphadenopathy:    She has no cervical adenopathy.  Neurological: She is alert and oriented to person, place, and time. No cranial nerve deficit.  Skin: Skin is warm. No rash noted. Nails show no clubbing.  Psychiatric: She has a normal mood and affect.      Data Reviewed: Basic Metabolic Panel: Recent Labs  Lab 12/12/19 0738 12/13/19 0607 12/14/19 0415 12/15/19 0617 12/16/19 0537 12/17/19 0637 12/18/19 0416  NA 133*   < > 137 138 138 138 139  K 3.3*   < > 3.8 3.1* 3.1* 3.7 3.7  CL 102   < > 112* 108 102 99 100  CO2 24   < > 21* 23 29 31 31   GLUCOSE 175*   < > 165* 89 113* 142* 139*  BUN 19   < > 15 10 11 16 13   CREATININE 0.62   < > 0.60 0.61 0.52 0.64 0.54  CALCIUM 8.1*   < > 8.0* 8.2* 8.1* 8.1* 8.0*  MG 1.4*  --  1.7  --   --  1.3*  --    < > = values in this interval not displayed.  Liver Function Tests: Recent Labs  Lab 12/12/19 0738  AST 17  ALT 11  ALKPHOS 69  BILITOT 0.7  PROT 6.8  ALBUMIN 2.6*   CBC: Recent Labs  Lab 12/12/19 0738 12/12/19 0738 12/13/19 0607 12/14/19 0415 12/15/19 0617 12/17/19 0638 12/18/19 0416  WBC 27.3*   < > 22.5* 19.1* 15.9* 11.7* 9.9  NEUTROABS 22.8*  --  19.8* 16.2* 10.7*  --   --   HGB 10.7*   < > 9.0* 8.0* 9.7* 8.5* 7.6*  HCT 33.5*   < > 27.7* 25.7* 30.6* 26.9* 24.4*  MCV 83.5   < > 81.2 85.1 82.3 82.8 84.1  PLT 459*   < > 398 370 375 457* 438*   < > = values in this interval not displayed.   BNP (last 3 results) Recent Labs    12/12/19 0738  BNP 546.0*    CBG: Recent Labs  Lab 12/17/19 1155 12/17/19 1638 12/17/19 2108 12/18/19 0803 12/18/19 1149  GLUCAP 146* 159* 134* 88 137*         Scheduled Meds: . atorvastatin  20 mg Oral Daily  . cholecalciferol  1,000 Units Oral Daily  . diltiazem  120 mg Oral Daily  . gabapentin  300 mg Oral TID  . Glucerna  237 mL Oral Daily  . insulin aspart  0-15 Units Subcutaneous TID WC  . insulin aspart  0-5 Units Subcutaneous QHS  . insulin glargine  8 Units Subcutaneous QHS  . lisinopril  5 mg Oral Daily  . magic mouthwash  15 mL Oral Daily  . methocarbamol  500 mg Oral TID  . pantoprazole  40 mg Oral BID  . polyethylene glycol  17 g Oral Daily  . predniSONE  40 mg Oral Q breakfast  . QUEtiapine  25 mg Oral QHS  . venlafaxine XR  150 mg Oral Daily     Assessment/Plan:  1. Severe sepsis, present on admission secondary to right lobar pneumonia.  Patient will finish up antibiotics today. 2. Tongue and mouth ulcerations looks like aphthous ulcers and thrush.  On IV Diflucan.  Continue viscous lidocaine and benzocaine gel.  Case discussed with ENT and they recommended starting prednisone and Magic mouthwash also. 3. Acute hypoxic respiratory failure.  Continue oxygen supplementation 4. Hypomagnesemia.  Replace 4 g IV magnesium yesterday. 5. Hypokalemia replaced 6. COPD.  Patient moving better air today. 7. Chronic back pain low back with intrathecal pump. 8. Type II insulin-dependent diabetes mellitus on Lantus and sliding scale.  With adding prednisone sugars will likely be higher. 9. History of right-sided breast cancer 10. Hypotension secondary to not eating and tachycardia.  Increase Cardizem CD 280 mg daily 11. Constipation improved.  Had Relistor injection earlier in the hospitalization.  Had bowel movement this morning 12. Iron deficiency anemia.  With drop in hemoglobin down to 7.6 during the hospital stay.  Holding aspirin.  Patient is guaiac negative.  Will give IV iron.  May end up needing a transfusion if continues to drop down.  LDH normal. 13. Insomnia trial of Seroquel at night  Code Status:     Code  Status Orders  (From admission, onward)         Start     Ordered   12/12/19 0558  Full code  Continuous     12/12/19 0557        Code Status History    Date Active Date Inactive Code Status Order ID Comments User Context   04/25/2017 1615 05/01/2017  1724 Full Code [id number]  Idelle Crouch, MD ED   Advance Care Planning Activity    Advance Directive Documentation     Most Recent Value  Type of Advance Directive  Healthcare Power of Attorney  Pre-existing out of facility DNR order (yellow form or pink MOST form)  --  "MOST" Form in Place?  --     Family Communication: Spoke with family at the bedside yesterday Disposition Plan: Status is: Inpatient  Dispo: The patient is from: Home              Anticipated d/c is to: Home with home health              Anticipated d/c date is: I am hoping on 12/20/2019              Patient currently not eating secondary to severe mouth pain and tongue pain which looks like aphthous ulcers and thrush.  Patient on IV Diflucan.  Started prednisone after discussing case with ENT.  Antibiotics:  Cefepime will complete today  po Zithromax completed  Diflucan IV  Time spent: 28 minutes  [place]

## 2019-12-18 NOTE — Consult Note (Signed)
Jordan Chan, Jordan Chan [id number] Jun 17, 1951 Riley Nearing, MD  Reason for Consult: mouth sores Requesting Physician: Loletha Grayer, MD Consulting Physician: Riley Nearing, MD  HPI: This 69 y.o. year old female was admitted on 12/11/2019 for Sepsis [facility]) [A41.9] Sepsis due to pneumonia ([place]) [J18.9, A41.9] Community acquired pneumonia, unspecified laterality [J18.9] Sepsis, due to unspecified organism, unspecified whether acute organ dysfunction present ([place]) [A41.9]. Patient treated for right lobar pneumonia. Has had sores in throat and mouth for several days, treated for thrush and remains on broad spectrum antibiotics. Also notes right earache  Medications:  Current Facility-Administered Medications  Medication Dose Route Frequency Provider Last Rate Last Admin  . acetaminophen (TYLENOL) tablet 650 mg  650 mg Oral Q6H PRN Loletha Grayer, MD   650 mg at 12/17/19 2232  . atorvastatin (LIPITOR) tablet 20 mg  20 mg Oral Daily Sharion Settler, NP   20 mg at 12/17/19 1727  . benzocaine (ORAJEL) 10 % mucosal gel   Mouth/Throat QID PRN Loletha Grayer, MD   1 application at [id number]  . butalbital-acetaminophen-caffeine (FIORICET) 50-325-40 MG per tablet 1 tablet  1 tablet Oral Q4H PRN Ralene Muskrat B, MD   1 tablet at 12/14/19 0212  . ceFEPIme (MAXIPIME) 2 g in sodium chloride 0.9 % 100 mL IVPB  2 g Intravenous Q12H Hallaji, Sheema M, RPH 200 mL/hr at 12/18/19 0856 2 g at 12/18/19 0856  . cholecalciferol (VITAMIN D3) tablet 1,000 Units  1,000 Units Oral Daily Sharion Settler, NP   1,000 Units at 12/18/19 0850  . diltiazem (CARDIZEM CD) 24 hr capsule 120 mg  120 mg Oral Daily Loletha Grayer, MD   120 mg at 12/18/19 0851  . fluconazole (DIFLUCAN) IVPB 100 mg  100 mg Intravenous Q24H Loletha Grayer, MD 50 mL/hr at 12/18/19 0959 100 mg at 12/18/19 0959  . gabapentin (NEURONTIN) capsule 300 mg  300 mg Oral TID Ralene Muskrat B, MD   300 mg at 12/18/19 0851  . Glucerna liquid 237 mL   237 mL Oral Daily Loletha Grayer, MD   237 mL at 12/18/19 0851  . HYDROmorphone (DILAUDID) injection 1 mg  1 mg Intravenous Q3H PRN Ralene Muskrat B, MD   1 mg at 12/12/19 2039  . insulin aspart (novoLOG) injection 0-15 Units  0-15 Units Subcutaneous TID WC Elwyn Reach, MD   3 Units at 12/17/19 1733  . insulin aspart (novoLOG) injection 0-5 Units  0-5 Units Subcutaneous QHS Elwyn Reach, MD   2 Units at 12/13/19 2234  . insulin glargine (LANTUS) injection 8 Units  8 Units Subcutaneous QHS Loletha Grayer, MD   8 Units at 12/17/19 2158  . ipratropium-albuterol (DUONEB) 0.5-2.5 (3) MG/3ML nebulizer solution 3 mL  3 mL Nebulization Q6H PRN Ouma, Bing Neighbors, NP      . lidocaine (XYLOCAINE) 2 % viscous mouth solution 15 mL  15 mL Mouth/Throat Q3H PRN Loletha Grayer, MD   15 mL at 12/18/19 0855  . lisinopril (ZESTRIL) tablet 5 mg  5 mg Oral Daily Sharion Settler, NP   5 mg at 12/18/19 0850  . LORazepam (ATIVAN) tablet 1 mg  1 mg Oral Q6H PRN Ralene Muskrat B, MD   1 mg at 12/17/19 2158   Or  . LORazepam (ATIVAN) injection 1 mg  1 mg Intramuscular Q6H PRN Ralene Muskrat B, MD      . magic mouthwash  15 mL Oral Daily Wieting, Richard, MD      . methocarbamol (ROBAXIN) tablet 500 mg  500 mg Oral TID Ralene Muskrat B, MD   500 mg at 12/18/19 0850  . pantoprazole (PROTONIX) EC tablet 40 mg  40 mg Oral BID Sharion Settler, NP   40 mg at 12/18/19 0850  . polyethylene glycol (MIRALAX / GLYCOLAX) packet 17 g  17 g Oral Daily Loletha Grayer, MD   17 g at 12/18/19 0851  . predniSONE (DELTASONE) tablet 40 mg  40 mg Oral Q breakfast Wieting, Richard, MD      . QUEtiapine (SEROQUEL) tablet 25 mg  25 mg Oral QHS Loletha Grayer, MD   25 mg at 12/17/19 2158  . venlafaxine XR (EFFEXOR-XR) 24 hr capsule 150 mg  150 mg Oral Daily Sharion Settler, NP   150 mg at 12/18/19 0851  .  Medications Prior to Admission  Medication Sig Dispense Refill  . ACCU-CHEK AVIVA PLUS test strip TEST  THREE TIMES DAILY 300 strip 1  . Accu-Chek Softclix Lancets lancets TEST BLOOD SUGAR THREE TIMES DAILY 300 each 1  . albuterol (PROVENTIL HFA;VENTOLIN HFA) 108 (90 BASE) MCG/ACT inhaler Inhale 2 puffs into the lungs every 6 (six) hours as needed for wheezing or shortness of breath.    [name] aspirin EC 81 MG tablet Take 81 mg by mouth daily.    [name] atorvastatin (LIPITOR) 20 MG tablet Take 1 tablet (20 mg total) by mouth daily. 90 tablet 3  . cholecalciferol (VITAMIN D3) 25 MCG (1000 UNIT) tablet Take 1,000 Units by mouth daily.    . clotrimazole (CLOTRIMAZOLE ANTI-FUNGAL) 1 % cream Apply 1 application topically 2 (two) times daily. 30 g 0  . cyclobenzaprine (FLEXERIL) 5 MG tablet Take 5 mg by mouth 2 (two) times daily as needed for muscle spasms.     . Dulaglutide (TRULICITY) 1.5 0000000 SOPN INJECT 1.5MG  (1 PEN) SUBCUTANEOUSLY EVERY WEEK (Patient taking differently: Inject 1.5 mg into the skin once a week. ) 12 pen 3  . gabapentin (NEURONTIN) 800 MG tablet Take 1 tablet (800 mg total) by mouth 3 (three) times daily. 270 tablet 3  . lisinopril (ZESTRIL) 5 MG tablet TAKE 1 TABLET EVERY DAY (Patient taking differently: Take 5 mg by mouth daily. ) 90 tablet 1  . metFORMIN (GLUCOPHAGE) 1000 MG tablet TAKE 1 TABLET TWICE DAILY WITH MEALS (Patient taking differently: Take 1,000 mg by mouth 2 (two) times daily. ) 180 tablet 1  . metoprolol succinate (TOPROL-XL) 25 MG 24 hr tablet TAKE 1/2 TABLET EVERY DAY (Patient taking differently: Take 12.5 mg by mouth daily. ) 45 tablet 1  . pantoprazole (PROTONIX) 40 MG tablet TAKE 1 TABLET TWICE DAILY (Patient taking differently: Take 40 mg by mouth 2 (two) times daily. ) 180 tablet 2  . [EXPIRED] predniSONE (DELTASONE) 20 MG tablet Take 2 tablets (40 mg total) by mouth daily with breakfast for 5 days. 10 tablet 0  . saccharomyces boulardii (FLORASTOR) 250 MG capsule Take 250 mg by mouth 2 (two) times daily.    . TRELEGY ELLIPTA 100-62.5-25 MCG/INH AEPB INHALE 1 PUFF INTO  THE LUNGS DAILY. 180 each 4  . triamcinolone ointment (KENALOG) 0.1 % Apply 1 application topically 2 (two) times daily. 30 g 0  . venlafaxine XR (EFFEXOR-XR) 150 MG 24 hr capsule Take 1 capsule (150 mg total) by mouth daily. 90 capsule 3  . TRESIBA FLEXTOUCH 100 UNIT/ML FlexTouch Pen INJECT 30 UNITS INTO THE SKIN AT BEDTIME. 30 mL 0    Allergies:  Allergies  Allergen Reactions  . Other Palpitations    IV steroids  .  Pain Patch [Menthol] Anaphylaxis  . Fentanyl   . Avelox [Moxifloxacin Hcl In Nacl] Other (See Comments)    Upset stomach  . Doxycycline Diarrhea  . Erythromycin Nausea Only and Other (See Comments)    Can take a Z-Pak just fine Other reaction(s): Other (See Comments) Can take Z-Pak  Can take a Z-Pak just fine  . Moxifloxacin Hcl Other (See Comments)    Upset stomach Upset stomach  . Oxycontin [Oxycodone] Hives  . Ozempic [Semaglutide] Nausea Only    PMH:  Past Medical History:  Diagnosis Date  . Arthritis   . Asthma   . Breast cancer ([place]) 1998   right breast ca with mastectomy and chemotherapy and radiation  . Bronchitis   . CHF (congestive heart failure) ([place])    "with Morphine withdrawal"  . COPD (chronic obstructive pulmonary disease) ([place])   . Diabetes mellitus without complication ([place])   . Diverticulitis    diverticulosis also  . Dyspnea   . Endometriosis   . GERD (gastroesophageal reflux disease)   . History of shingles 2000-2005  . Hypercholesteremia   . Hypertension   . IBS (irritable bowel syndrome)   . Low back pain    a. Implanted morphine/bupivicaine/clonidine pump.  [name] Neuropathy   . Orthopnea   . Oxygen dependent    uses at night  . Personal history of chemotherapy   . Personal history of radiation therapy   . Pneumonia    pneumonia 5-6 times, history of bronchitis also  . Scoliosis   . Sleep apnea    does not use cpap  . Stroke ([place]) 2010   TIA, 10 years ago  . Withdrawal from sedative drug ([place])    withdrawal from  morphine when pump batteries died    Fam Hx:  Family History  Problem Relation Age of Onset  . Cancer Mother 28       lung  . Thyroid disease Mother   . Cancer Father 59       Pancreatic  . Hypertension Sister   . Cancer Sister        "cancer on face"  . Hyperlipidemia Sister   . Osteoporosis Maternal Grandmother   . Hyperlipidemia Son   . Seizures Son   . Cancer Paternal Grandmother        colon  . Arthritis Paternal Grandfather   . Breast cancer Neg Hx     Soc Hx:  Social History   Socioeconomic History  . Marital status: Legally Separated    Spouse name: Not on file  . Number of children: Not on file  . Years of education: Not on file  . Highest education level: High school graduate  Occupational History  . Occupation: retired  Tobacco Use  . Smoking status: Former Smoker    Packs/day: 1.00    Years: 30.00    Pack years: 30.00    Types: Cigarettes    Quit date: 04/30/2004    Years since quitting: 15.6  . Smokeless tobacco: Never Used  Substance and Sexual Activity  . Alcohol use: No  . Drug use: No  . Sexual activity: Not Currently    Birth control/protection: Post-menopausal  Other Topics Concern  . Not on file  Social History Narrative  . Not on file   Social Determinants of Health   Financial Resource Strain: Low Risk   . Difficulty of Paying Living Expenses: Not very hard  Food Insecurity:   . Worried About Charity fundraiser in the  Last Year:   . [place] in the Last Year:   Transportation Needs:   . Film/video editor (Medical):   [name] Lack of Transportation (Non-Medical):   Physical Activity:   . Days of Exercise per Week:   . Minutes of Exercise per Session:   Stress:   . Feeling of Stress :   Social Connections:   . Frequency of Communication with Friends and Family:   . Frequency of Social Gatherings with Friends and Family:   . Attends Religious Services:   . Active Member of Clubs or Organizations:   . Attends Theatre manager Meetings:   [name] Marital Status:   Intimate Partner Violence:   . Fear of Current or Ex-Partner:   . Emotionally Abused:   [name] Physically Abused:   . Sexually Abused:     PSH:  Past Surgical History:  Procedure Laterality Date  . ABDOMINAL HYSTERECTOMY  1987  . BACK SURGERY     Tailbone removed following fracture  . BREAST SURGERY Right    mastectomy  . CARDIAC CATHETERIZATION    . CATARACT EXTRACTION W/PHACO Right 05/19/2019   Procedure: CATARACT EXTRACTION PHACO AND INTRAOCULAR LENS PLACEMENT ([place]), RIGHT, DIABETIC;  Surgeon: Marchia Meiers, MD;  Location: [facility] ORS;  Service: Ophthalmology;  Laterality: Right;  Lot # [id number] H [place]: 00:43.8 CDE: 4.59  . CATARACT EXTRACTION W/PHACO Left 06/16/2019   Procedure: CATARACT EXTRACTION PHACO AND INTRAOCULAR LENS PLACEMENT ([place]) LEFT VISION BLUE DIABETIC;  Surgeon: Marchia Meiers, MD;  Location: [facility] ORS;  Service: Ophthalmology;  Laterality: Left;  Lot [id number] H [place]: 00:46.9 CDE: 6.53  . COCCYX REMOVAL    . COLONOSCOPY WITH PROPOFOL N/A 01/01/2017   Procedure: COLONOSCOPY WITH PROPOFOL;  Surgeon: Jonathon Bellows, MD;  Location: [facility] ENDOSCOPY;  Service: Endoscopy;  Laterality: N/A;  . ELBOW ARTHROSCOPY WITH TENDON RECONSTRUCTION    . ESOPHAGOGASTRODUODENOSCOPY (EGD) WITH PROPOFOL N/A 01/01/2017   Procedure: ESOPHAGOGASTRODUODENOSCOPY (EGD) WITH PROPOFOL;  Surgeon: Jonathon Bellows, MD;  Location: [facility] ENDOSCOPY;  Service: Endoscopy;  Laterality: N/A;  . INTRATHECAL PUMP IMPLANT    . LEFT HEART CATH AND CORONARY ANGIOGRAPHY N/A 04/28/2017   Procedure: LEFT HEART CATH AND CORONARY ANGIOGRAPHY;  Surgeon: [name], MD;  Location: [place] [facility] CV LAB;  Service: Cardiovascular;  Laterality: N/A;  . MASTECTOMY Right 06/1997  . morphine pump  2011  . PLANTAR FASCIA RELEASE    . TRIGGER FINGER RELEASE    . Procedures since admission: No admission procedures for hospital encounter.  ROS: Review of systems normal other than 12 systems except  per HPI.  PHYSICAL EXAM  Vitals: Blood pressure (!) 110/57, pulse 86, temperature 98.2 F (36.8 C), temperature source Oral, resp. rate 14, height 4\' 10"  (1.473 m), weight 66 kg, SpO2 96 %.. General: Well-developed, Well-nourished in no acute distress Mood: Mood and affect well adjusted, pleasant and cooperative. Orientation: Grossly alert and oriented. Vocal Quality: No hoarseness. Communicates verbally. head and Face: NCAT. No facial asymmetry. No visible skin lesions. No significant facial scars. No tenderness with sinus percussion. Facial strength normal and symmetric. Ears: External ears with normal landmarks, no lesions. External auditory canals free of infection, cerumen impaction or lesions. Tympanic membranes intact with good landmarks and normal mobility on pneumatic otoscopy. No middle ear effusion. Hearing: Speech reception grossly normal. Nose: External nose normal with midline dorsum and no lesions or deformity. Nasal Cavity reveals essentially midline septum with normal inferior turbinates. No significant mucosal congestion or erythema. Nasal secretions are minimal and clear.  No polyps seen on anterior rhinoscopy. Oral Cavity/ Oropharynx: Lips are normal with no lesions. Wears denture. Gingiva healthy with no lesions or gingivitis. Oropharynx has shallow ulceration of the mucosa of the uvula and adjacent soft palate, more on the right. Also shallow ulceration of tip of tongue. Mucosa dry.  Indirect Laryngoscopy/Nasopharyngoscopy: Visualization of the larynx, hypopharynx and nasopharynx is not possible in this setting with routine examination. Neck: Supple and symmetric with no palpable masses, tenderness or crepitance. The trachea is midline. Thyroid gland is soft, nontender and symmetric with no masses or enlargement. Parotid and submandibular glands are soft, nontender and symmetric, without masses. Lymphatic: Cervical lymph nodes are without palpable lymphadenopathy or  tenderness. Respiratory: Normal respiratory effort without labored breathing. Cardiovascular: Carotid pulse shows regular rate and rhythm Neurologic: Cranial Nerves II through XII are grossly intact. Eyes: Gaze and Ocular Motility are grossly normal. PERRLA. No visible nystagmus.  MEDICAL DECISION MAKING: Data Review:  Results for orders placed or performed during the hospital encounter of 12/11/19 (from the past 48 hour(s))  Glucose, capillary     Status: Abnormal   Collection Time: 12/16/19  4:28 PM  Result Value Ref Range   Glucose-Capillary 151 (H) 70 - 99 mg/dL    Comment: Glucose reference range applies only to samples taken after fasting for at least 8 hours.  Glucose, capillary     Status: Abnormal   Collection Time: 12/16/19 10:01 PM  Result Value Ref Range   Glucose-Capillary 145 (H) 70 - 99 mg/dL    Comment: Glucose reference range applies only to samples taken after fasting for at least 8 hours.  Basic metabolic panel     Status: Abnormal   Collection Time: 12/17/19  6:37 AM  Result Value Ref Range   Sodium 138 135 - 145 mmol/L   Potassium 3.7 3.5 - 5.1 mmol/L   Chloride 99 98 - 111 mmol/L   CO2 31 22 - 32 mmol/L   Glucose, Bld 142 (H) 70 - 99 mg/dL    Comment: Glucose reference range applies only to samples taken after fasting for at least 8 hours.   BUN 16 8 - 23 mg/dL   Creatinine, Ser 0.64 0.44 - 1.00 mg/dL   Calcium 8.1 (L) 8.9 - 10.3 mg/dL   GFR calc non Af Amer >60 >60 mL/min   GFR calc Af Amer >60 >60 mL/min   Anion gap 8 5 - 15    Comment: Performed at [facility], [address] [place], [place] [address]  Magnesium     Status: Abnormal   Collection Time: 12/17/19  6:37 AM  Result Value Ref Range   Magnesium 1.3 (L) 1.7 - 2.4 mg/dL    Comment: Performed at [facility], [address] [place],  [address]  CBC     Status: Abnormal   Collection Time: 12/17/19  6:38 AM  Result Value Ref Range   WBC 11.7 (H) 4.0 - 10.5 K/uL    RBC 3.25 (L) 3.87 - 5.11 MIL/uL   Hemoglobin 8.5 (L) 12.0 - 15.0 g/dL   HCT 26.9 (L) 36.0 - 46.0 %   MCV 82.8 80.0 - 100.0 fL   MCH 26.2 26.0 - 34.0 pg   MCHC 31.6 30.0 - 36.0 g/dL   RDW 16.7 (H) 11.5 - 15.5 %   Platelets 457 (H) 150 - 400 K/uL   nRBC 0.2 0.0 - 0.2 %    Comment: Performed at [facility], [place], [place]  [address]  Glucose, capillary     Status: Abnormal   Collection Time: 12/17/19  8:22 AM  Result Value Ref Range   Glucose-Capillary 134 (H) 70 - 99 mg/dL    Comment: Glucose reference range applies only to samples taken after fasting for at least 8 hours.   Comment 1 Notify RN   Glucose, capillary     Status: Abnormal   Collection Time: 12/17/19 11:55 AM  Result Value Ref Range   Glucose-Capillary 146 (H) 70 - 99 mg/dL    Comment: Glucose reference range applies only to samples taken after fasting for at least 8 hours.  Glucose, capillary     Status: Abnormal   Collection Time: 12/17/19  4:38 PM  Result Value Ref Range   Glucose-Capillary 159 (H) 70 - 99 mg/dL    Comment: Glucose reference range applies only to samples taken after fasting for at least 8 hours.   Comment 1 Notify RN   Glucose, capillary     Status: Abnormal   Collection Time: 12/17/19  9:08 PM  Result Value Ref Range   Glucose-Capillary 134 (H) 70 - 99 mg/dL    Comment: Glucose reference range applies only to samples taken after fasting for at least 8 hours.  Ferritin     Status: None   Collection Time: 12/18/19  4:16 AM  Result Value Ref Range   Ferritin 73 11 - 307 ng/mL    Comment: Performed at [facility], [address] [place], [place] [address]  Retic Panel     Status: Abnormal   Collection Time: 12/18/19  4:16 AM  Result Value Ref Range   Retic Ct Pct 1.2 0.4 - 3.1 %   RBC. 2.91 (L) 3.87 - 5.11 MIL/uL   Retic Count, Absolute 34.0 19.0 - 186.0 K/uL   Immature Retic Fract 40.7 (H) 2.3 - 15.9 %   Reticulocyte Hemoglobin 29.9 >27.9 pg    Comment:  Performed at [facility], [address] [place], [place] [address]  Lactate dehydrogenase     Status: None   Collection Time: 12/18/19  4:16 AM  Result Value Ref Range   LDH 122 98 - 192 U/L    Comment: Performed at [facility], [address] [place], [place] [address]  CBC     Status: Abnormal   Collection Time: 12/18/19  4:16 AM  Result Value Ref Range   WBC 9.9 4.0 - 10.5 K/uL   RBC 2.90 (L) 3.87 - 5.11 MIL/uL   Hemoglobin 7.6 (L) 12.0 - 15.0 g/dL   HCT 24.4 (L) 36.0 - 46.0 %   MCV 84.1 80.0 - 100.0 fL   MCH 26.2 26.0 - 34.0 pg   MCHC 31.1 30.0 - 36.0 g/dL   RDW 16.6 (H) 11.5 - 15.5 %   Platelets 438 (H) 150 - 400 K/uL   nRBC 0.0 0.0 - 0.2 %    Comment: Performed at [facility], [address], [place], [place] [id number]  Basic metabolic panel     Status: Abnormal   Collection Time: 12/18/19  4:16 AM  Result Value Ref Range   Sodium 139 135 - 145 mmol/L   Potassium 3.7 3.5 - 5.1 mmol/L   Chloride 100 98 - 111 mmol/L   CO2 31 22 - 32 mmol/L   Glucose, Bld 139 (H) 70 - 99 mg/dL    Comment: Glucose reference range applies only to samples taken after fasting for at least 8 hours.   BUN  13 8 - 23 mg/dL   Creatinine, Ser 0.54 0.44 - 1.00 mg/dL   Calcium 8.0 (L) 8.9 - 10.3 mg/dL   GFR calc non Af Amer >60 >60 mL/min   GFR calc Af Amer >60 >60 mL/min   Anion gap 8 5 - 15    Comment: Performed at [facility], [address] [place], [place] [address]  Glucose, capillary     Status: None   Collection Time: 12/18/19  8:03 AM  Result Value Ref Range   Glucose-Capillary 88 70 - 99 mg/dL    Comment: Glucose reference range applies only to samples taken after fasting for at least 8 hours.  Occult blood card to lab, stool RN will collect     Status: None   Collection Time: 12/18/19 10:02 AM  Result Value Ref Range   Fecal Occult Bld NEGATIVE NEGATIVE    Comment: Performed at [facility], [address], [place], [place]  [address]  . No results found..   ASSESSMENT: Oral ulcers  PLAN: Likely stress aphthous ulcers from deconditioning. Treatment would be supportive with hydration, Duke's Magic Mouthwash, and prednisone if no clinical contraindication for inflammation. Nursing getting culture, but unlikely bacterial component. No definite thrush seen st this point. But can repeat Diflucan if recurs   Riley Nearing, MD 12/18/2019 11:42 AM

## 2019-12-19 LAB — CBC
HCT: 27.5 % — ABNORMAL LOW (ref 36.0–46.0)
Hemoglobin: 8.3 g/dL — ABNORMAL LOW (ref 12.0–15.0)
MCH: 25.9 pg — ABNORMAL LOW (ref 26.0–34.0)
MCHC: 30.2 g/dL (ref 30.0–36.0)
MCV: 85.9 fL (ref 80.0–100.0)
Platelets: 460 10*3/uL — ABNORMAL HIGH (ref 150–400)
RBC: 3.2 MIL/uL — ABNORMAL LOW (ref 3.87–5.11)
RDW: 16.5 % — ABNORMAL HIGH (ref 11.5–15.5)
WBC: 10.5 10*3/uL (ref 4.0–10.5)
nRBC: 0 % (ref 0.0–0.2)

## 2019-12-19 LAB — GLUCOSE, CAPILLARY
Glucose-Capillary: 162 mg/dL — ABNORMAL HIGH (ref 70–99)
Glucose-Capillary: 171 mg/dL — ABNORMAL HIGH (ref 70–99)
Glucose-Capillary: 262 mg/dL — ABNORMAL HIGH (ref 70–99)
Glucose-Capillary: 258 mg/dL — ABNORMAL HIGH (ref 70–99)
Glucose-Capillary: 227 mg/dL — ABNORMAL HIGH (ref 70–99)
Glucose-Capillary: 175 mg/dL — ABNORMAL HIGH (ref 70–99)

## 2019-12-19 LAB — BASIC METABOLIC PANEL
Anion gap: 8 (ref 5–15)
BUN: 10 mg/dL (ref 8–23)
CO2: 32 mmol/L (ref 22–32)
Calcium: 8.3 mg/dL — ABNORMAL LOW (ref 8.9–10.3)
Chloride: 99 mmol/L (ref 98–111)
Creatinine, Ser: 0.64 mg/dL (ref 0.44–1.00)
GFR calc Af Amer: >60 mL/min (ref >60)
GFR calc non Af Amer: >60 mL/min (ref >60)
Glucose, Bld: 146 mg/dL — ABNORMAL HIGH (ref 70–99)
Potassium: 3.9 mmol/L (ref 3.5–5.1)
Sodium: 139 mmol/L (ref 135–145)

## 2019-12-19 LAB — TYPE AND SCREEN
ABO/RH(D): O NEG
Antibody Screen: NEGATIVE

## 2019-12-19 LAB — MAGNESIUM: Magnesium: 2 mg/dL (ref 1.7–2.4)

## 2019-12-19 MED ORDER — CYCLOBENZAPRINE HCL 10 MG PO TABS: 5.0000 mg | ORAL_TABLET | Freq: Three times a day (TID) | ORAL | Status: DC | PRN

## 2019-12-19 MED ORDER — LACTULOSE 10 GM/15ML PO SOLN
30.0000 g | Freq: Two times a day (BID) | ORAL | Status: DC
  Administered 2019-12-19 (×2): 30 g via ORAL
  Filled 2019-12-19 (×3): qty 60

## 2019-12-19 NOTE — Progress Notes (Signed)
SATURATION QUALIFICATIONS: (This note is used to comply with regulatory documentation for home oxygen)  Patient Saturations on Room Air at Rest = 93 %  Patient Saturations on Room Air while Ambulating = 75%  Patient Saturations on 3Liters of oxygen while Ambulating = 92%  Please briefly explain why patient needs home oxygen:

## 2019-12-19 NOTE — Care Management Important Message (Signed)
Important Message  Patient Details  Name: Jordan Chan MRN: [id number] Date of Birth: 1950/10/13   Medicare Important Message Given:  Yes     Dannette Barbara 12/19/2019, 11:27 AM

## 2019-12-19 NOTE — Progress Notes (Signed)
[name], female   DOB: 1950/12/30, 69 y.o.   MRN: [id number] [facility] PROGRESS NOTE  Jordan Chan [id number] DOB: [birth year] DOA: 12/11/2019 PCP: [name], NP  HPI/Subjective: Patient still having a lot of pain in her mouth and tongue and cheek.  A little bit less with the benzocaine oral gel and starting steroids yesterday.  Patient with some neck discomfort.  States she has a history of torticollis.  Objective: Vitals:   12/19/19 0407 12/19/19 1122  BP: (!) 109/59 (!) 141/58  Pulse: 88 97  Resp: 16   Temp: 98.4 F (36.9 C) 98.1 F (36.7 C)  SpO2: 96% 97%    Intake/Output Summary (Last 24 hours) at 12/19/2019 1326 Last data filed at 12/19/2019 0900 Gross per 24 hour  Intake 610 ml  Output 500 ml  Net 110 ml   Filed Weights   12/11/19 1411 12/12/19 1648 12/13/19 0448  Weight: 64.4 kg 65.5 kg 66 kg    ROS: Review of Systems  Constitutional: Positive for malaise/fatigue. Negative for fever.  HENT: Positive for sore throat.   Eyes: Negative for blurred vision.  Respiratory: Positive for shortness of breath. Negative for cough.   Cardiovascular: Negative for chest pain.  Gastrointestinal: Negative for abdominal pain, constipation, diarrhea, nausea and vomiting.  Genitourinary: Negative for dysuria.  Musculoskeletal: Positive for back pain. Negative for joint pain.  Neurological: Negative for dizziness.   Exam: Physical Exam  Constitutional: She is oriented to person, place, and time.  HENT:  Nose: No mucosal edema.  Mouth/Throat: No oropharyngeal exudate or posterior oropharyngeal edema.  Large aphthous ulcer left cheek.  Small 1 on the right cheek.  Aphthous ulcers on the tongue or not white at this point then turned reddish.  Back of the throat less erythema.  Still has some whitish exudate.  Eyes: Conjunctivae and lids are normal.  Neck: Carotid bruit is not present.  Cardiovascular: S1 normal and S2 normal. Tachycardia  present. Exam reveals no gallop.  No murmur heard. Respiratory: No respiratory distress. She has decreased breath sounds in the right lower field and the left lower field. She has no wheezes. She has rhonchi in the right lower field and the left lower field. She has no rales.  GI: Soft. Bowel sounds are normal. There is no abdominal tenderness.  Musculoskeletal:     Right ankle: No swelling.     Left ankle: No swelling.  Lymphadenopathy:    She has no cervical adenopathy.  Neurological: She is alert and oriented to person, place, and time. No cranial nerve deficit.  Skin: Skin is warm. No rash noted. Nails show no clubbing.  Psychiatric: She has a normal mood and affect.      Data Reviewed: Basic Metabolic Panel: Recent Labs  Lab 12/14/19 0415 12/14/19 0415 12/15/19 0617 12/16/19 0537 12/17/19 0637 12/18/19 0416 12/19/19 0441  NA 137   < > 138 138 138 139 139  K 3.8   < > 3.1* 3.1* 3.7 3.7 3.9  CL 112*   < > 108 102 99 100 99  CO2 21*   < > 23 29 31 31  32  GLUCOSE 165*   < > 89 113* 142* 139* 146*  BUN 15   < > 10 11 16 13 10   CREATININE 0.60   < > 0.61 0.52 0.64 0.54 0.64  CALCIUM 8.0*   < > 8.2* 8.1* 8.1* 8.0* 8.3*  MG 1.7  --   --   --  1.3*  --  2.0   < > = values in this interval not displayed.   Liver Function Tests: No results for input(s): AST, ALT, ALKPHOS, BILITOT, PROT, ALBUMIN in the last 168 hours. CBC: Recent Labs  Lab 12/13/19 0607 12/13/19 0607 12/14/19 0415 12/15/19 0617 12/17/19 0638 12/18/19 0416 12/19/19 0441  WBC 22.5*   < > 19.1* 15.9* 11.7* 9.9 10.5  NEUTROABS 19.8*  --  16.2* 10.7*  --   --   --   HGB 9.0*   < > 8.0* 9.7* 8.5* 7.6* 8.3*  HCT 27.7*   < > 25.7* 30.6* 26.9* 24.4* 27.5*  MCV 81.2   < > 85.1 82.3 82.8 84.1 85.9  PLT 398   < > 370 375 457* 438* 460*   < > = values in this interval not displayed.   BNP (last 3 results) Recent Labs    12/12/19 0738  BNP 546.0*    CBG: Recent Labs  Lab 12/18/19 1149 12/18/19 1625  12/18/19 1941 12/19/19 0809 12/19/19 1121  GLUCAP 137* 179* 300* 162* 171*        Scheduled Meds: . atorvastatin  20 mg Oral Daily  . cholecalciferol  1,000 Units Oral Daily  . diltiazem  180 mg Oral Daily  . gabapentin  300 mg Oral TID  . Glucerna  237 mL Oral Daily  . insulin aspart  0-15 Units Subcutaneous TID WC  . insulin aspart  0-5 Units Subcutaneous QHS  . insulin glargine  8 Units Subcutaneous QHS  . lactulose  30 g Oral BID  . magic mouthwash  15 mL Oral Daily  . methocarbamol  500 mg Oral TID  . pantoprazole  40 mg Oral BID  . polyethylene glycol  17 g Oral Daily  . predniSONE  40 mg Oral Q breakfast  . QUEtiapine  25 mg Oral QHS  . venlafaxine XR  150 mg Oral Daily     Assessment/Plan:  1. Tongue and mouth ulcerations looks like aphthous ulcers and thrush.  On IV Diflucan.  Continue viscous lidocaine and benzocaine gel.  Continue prednisone and Magic mouthwash.  If patient is able to eat today hoping to send home tomorrow. 2. Acute hypoxic respiratory failure.  Continue oxygen supplementation.  Qualify for home oxygen today for potential discharge tomorrow. 3. Sepsis with pneumonia.  Finished antibiotics yesterday 4. Hypomagnesemia.  Replaced. 5. Hypokalemia replaced 6. COPD.  Lungs are clear. 7. Chronic back pain low back with intrathecal pump. 8. Type II insulin-dependent diabetes mellitus on Lantus and sliding scale.  With adding prednisone sugars will likely be higher. 9. History of right-sided breast cancer 10. Hypotension secondary to not eating and tachycardia.  Episodes of SVT.  Continue Cardizem CD 180 mg daily 11. Constipation improved.  Had Relistor injection earlier in the hospitalization.  Will give lactulose today. 12. Iron deficiency anemia.  Hemoglobin 8.3.  Gave IV iron yesterday. 13. Insomnia trial of Seroquel at night 14. Neck pain trial of Flexeril.  Code Status:     Code Status Orders  (From admission, onward)         Start      Ordered   12/12/19 0558  Full code  Continuous     12/12/19 0557        Code Status History    Date Active Date Inactive Code Status Order ID Comments User Context   04/25/2017 1615 05/01/2017 1724 Full Code [id number]  Jordan Crouch, MD ED   Advance Care Planning Activity  Advance Directive Documentation     Most Recent Value  Type of Advance Directive  Healthcare Power of Attorney  Pre-existing out of facility DNR order (yellow form or pink MOST form)  --  "MOST" Form in Place?  --     Family Communication: Spoke with daughter on the phone Disposition Plan: Status is: Inpatient  Dispo: The patient is from: Home              Anticipated d/c is to: Home with home health              Anticipated d/c date is: I am hopeful for discharge on 12/20/2019.              Patient currently not eating secondary to severe mouth pain and tongue pain which looks like aphthous ulcers and thrush.  Continue IV Diflucan and oral prednisone.  If the patient is able to eat better today I will discharge tomorrow.  We will get qualifying oxygen saturations for home oxygen.  Antibiotics:  Diflucan IV  Time spent: 27 minutes  [place]

## 2019-12-20 DIAGNOSIS — J9621 Acute and chronic respiratory failure with hypoxia: Secondary | ICD-10-CM

## 2019-12-20 LAB — GLUCOSE, CAPILLARY
Glucose-Capillary: 127 mg/dL — ABNORMAL HIGH (ref 70–99)
Glucose-Capillary: 119 mg/dL — ABNORMAL HIGH (ref 70–99)

## 2019-12-20 LAB — HAPTOGLOBIN: Haptoglobin: 304 mg/dL (ref 37–355)

## 2019-12-20 MED ORDER — BENZOCAINE 10 % MT GEL: Freq: Four times a day (QID) | OROMUCOSAL | 0 refills | Status: DC | PRN

## 2019-12-20 MED ORDER — LACTULOSE 10 GM/15ML PO SOLN: 30.0000 g | Freq: Every day | ORAL | 0 refills | Status: DC | PRN

## 2019-12-20 MED ORDER — LORAZEPAM 0.5 MG PO TABS: 1.0000 mg | ORAL_TABLET | Freq: Three times a day (TID) | ORAL | 0 refills | Status: DC | PRN

## 2019-12-20 MED ORDER — QUETIAPINE FUMARATE 25 MG PO TABS: 25.0000 mg | ORAL_TABLET | Freq: Every evening | ORAL | 0 refills | Status: DC | PRN

## 2019-12-20 MED ORDER — FLUCONAZOLE 100 MG PO TABS
100.0000 mg | ORAL_TABLET | Freq: Every day | ORAL | 0 refills | Status: AC
Start: 2019-12-20 — End: 2020-01-19

## 2019-12-20 MED ORDER — TRESIBA FLEXTOUCH 100 UNIT/ML ~~LOC~~ SOPN: 12.0000 [IU] | PEN_INJECTOR | Freq: Every day | SUBCUTANEOUS | 0 refills | Status: DC

## 2019-12-20 MED ORDER — GLUCERNA PO LIQD: 237.0000 mL | Freq: Every day | ORAL | 0 refills | Status: DC

## 2019-12-20 MED ORDER — DILTIAZEM HCL ER COATED BEADS 180 MG PO CP24: 180.0000 mg | ORAL_CAPSULE | Freq: Every day | ORAL | 0 refills | Status: DC

## 2019-12-20 MED ORDER — PREDNISONE 10 MG PO TABS
ORAL_TABLET | ORAL | 0 refills | Status: DC
Start: 2019-12-20 — End: 2019-12-30

## 2019-12-20 NOTE — Discharge Summary (Signed)
[facility] [place] NAME: Jordan Chan    MR#:  [id number]  DATE OF BIRTH:  1950-11-03  DATE OF ADMISSION:  12/11/2019 ADMITTING PHYSICIAN: Sidney Ace, MD  DATE OF DISCHARGE: 12/20/2019  2:39 PM  PRIMARY CARE PHYSICIAN: [name], NP    ADMISSION DIAGNOSIS:  Sepsis ([place]) [A41.9] Sepsis due to pneumonia ([place]) [J18.9, A41.9] Community acquired pneumonia, unspecified laterality [J18.9] Sepsis, due to unspecified organism, unspecified whether acute organ dysfunction present ([place]) [A41.9]  DISCHARGE DIAGNOSIS:  Principal Problem:   Sepsis ([place]) Active Problems:   Malignant neoplasm of right breast ([place])   GERD (gastroesophageal reflux disease)   COPD, moderate ([place])   Hyperlipidemia associated with type 2 diabetes mellitus (HCC)   Chronic pain syndrome   Thrush   Presence of intrathecal pump   Community acquired pneumonia   Severe sepsis ([place])   Lobar pneumonia ([place])   Hypomagnesemia   Acute on chronic respiratory failure with hypoxia (HCC)   Insulin dependent type 2 diabetes mellitus (HCC)   Constipation   Aphthous ulcer of tongue   SVT (supraventricular tachycardia) ([place])   SECONDARY DIAGNOSIS:   Past Medical History:  Diagnosis Date  . Arthritis   . Asthma   . Breast cancer ([place]) 1998   right breast ca with mastectomy and chemotherapy and radiation  . Bronchitis   . CHF (congestive heart failure) ([place])    "with Morphine withdrawal"  . COPD (chronic obstructive pulmonary disease) ([place])   . Diabetes mellitus without complication ([place])   . Diverticulitis    diverticulosis also  . Dyspnea   . Endometriosis   . GERD (gastroesophageal reflux disease)   . History of shingles 2000-2005  . Hypercholesteremia   . Hypertension   . IBS (irritable bowel syndrome)   . Low back pain    a. Implanted morphine/bupivicaine/clonidine pump.  [name] Neuropathy   . Orthopnea   . Oxygen dependent    uses at night  .  Personal history of chemotherapy   . Personal history of radiation therapy   . Pneumonia    pneumonia 5-6 times, history of bronchitis also  . Scoliosis   . Sleep apnea    does not use cpap  . Stroke ([place]) 2010   TIA, 10 years ago  . Withdrawal from sedative drug ([place])    withdrawal from morphine when pump batteries died    HOSPITAL COURSE:   1.  Clinical sepsis with lobar pneumonia.  Patient finished her entire course of antibiotics here in the hospital. 2.  Acute on now chronic hypoxic respiratory failure.  The patient qualified for home oxygen and was set up for 24/7 oxygen prior to disposition.  Pulse ox dropped into the 80s with ambulation. 3.  Tongue and mouth ulcerations.  Aphthous ulcers and thrush seen.  The patient was given IV Diflucan.  Patient was given viscous lidocaine benzocaine gel Magic mouthwash.  Patient feeling better with regards to her mouth and now able to eat.  I actually got ENT to see the patient and they recommended steroids.  I will give a prednisone taper and Diflucan a few more days after the prednisone finishes.  Benzocaine gel prescribed upon discharge also. 4.  Hypokalemia and hypomagnesemia.  These have both been replaced during the hospital course. 5.  COPD.  Lungs are clear upon discharge.  Now has chronic respiratory failure on oxygen 6.  Chronic back pain with intrathecal pump 7.  Type 2 diabetes mellitus  on Tresiba insulin at home can go back on this.  Sugars will be a little higher while on steroids. 8.  History of right-sided breast cancer 9.  Hypotension secondary to not eating and tachycardia with episodes of SVT.  I changed her Toprol over to Cardizem CD.  Heart rate better once her pain in her mouth has settled down. 10.  Constipation.  I did give a dose of Relistor earlier in the hospitalization which helped her constipation.  We will give lactulose as needed upon discharge. 11.  Iron deficiency anemia.  I did give IV Venofer during the hospital  course.  Hemoglobin 8.3 upon discharge 12.  Insomnia.  Patient finally slept with Seroquel at night.  Will make this as needed at home 13.  Severe anxiety as needed Ativan.  DISCHARGE CONDITIONS:   Fair  CONSULTS OBTAINED:  Treatment Team:  Clyde Canterbury, MD  DRUG ALLERGIES:   Allergies  Allergen Reactions  . Other Palpitations    IV steroids  . Pain Patch [Menthol] Anaphylaxis  . Fentanyl   . Avelox [Moxifloxacin Hcl In Nacl] Other (See Comments)    Upset stomach  . Doxycycline Diarrhea  . Erythromycin Nausea Only and Other (See Comments)    Can take a Z-Pak just fine Other reaction(s): Other (See Comments) Can take Z-Pak  Can take a Z-Pak just fine  . Moxifloxacin Hcl Other (See Comments)    Upset stomach Upset stomach  . Oxycontin [Oxycodone] Hives  . Ozempic [Semaglutide] Nausea Only    DISCHARGE MEDICATIONS:   Allergies as of 12/20/2019      Reactions   Other Palpitations   IV steroids   Pain Patch [menthol] Anaphylaxis   Fentanyl    Avelox [moxifloxacin Hcl In Nacl] Other (See Comments)   Upset stomach   Doxycycline Diarrhea   Erythromycin Nausea Only, Other (See Comments)   Can take a Z-Pak just fine Other reaction(s): Other (See Comments) Can take Z-Pak Can take a Z-Pak just fine   Moxifloxacin Hcl Other (See Comments)   Upset stomach Upset stomach   Oxycontin [oxycodone] Hives   Ozempic [semaglutide] Nausea Only      Medication List    STOP taking these medications   aspirin EC 81 MG tablet   lisinopril 5 MG tablet Commonly known as: ZESTRIL   metoprolol succinate 25 MG 24 hr tablet Commonly known as: TOPROL-XL   Trulicity 1.5 0000000 Sopn Generic drug: Dulaglutide     TAKE these medications   Accu-Chek Aviva Plus test strip Generic drug: glucose blood TEST THREE TIMES DAILY   Accu-Chek Softclix Lancets lancets TEST BLOOD SUGAR THREE TIMES DAILY   albuterol 108 (90 Base) MCG/ACT inhaler Commonly known as: VENTOLIN HFA Inhale  2 puffs into the lungs every 6 (six) hours as needed for wheezing or shortness of breath.   atorvastatin 20 MG tablet Commonly known as: LIPITOR Take 1 tablet (20 mg total) by mouth daily.   benzocaine 10 % mucosal gel Commonly known as: ORAJEL Use as directed in the mouth or throat 4 (four) times daily as needed for mouth pain.   cholecalciferol 25 MCG (1000 UNIT) tablet Commonly known as: VITAMIN D3 Take 1,000 Units by mouth daily.   clotrimazole 1 % cream Commonly known as: Clotrimazole Anti-Fungal Apply 1 application topically 2 (two) times daily.   cyclobenzaprine 5 MG tablet Commonly known as: FLEXERIL Take 5 mg by mouth 2 (two) times daily as needed for muscle spasms.   diltiazem 180 MG 24 hr  capsule Commonly known as: CARDIZEM CD Take 1 capsule (180 mg total) by mouth daily. Start taking on: Dec 21, 2019   fluconazole 100 MG tablet Commonly known as: Diflucan Take 1 tablet (100 mg total) by mouth daily.   gabapentin 800 MG tablet Commonly known as: NEURONTIN Take 1 tablet (800 mg total) by mouth 3 (three) times daily.   Glucerna Liqd Take 237 mLs by mouth daily. Start taking on: Dec 21, 2019   lactulose 10 GM/15ML solution Commonly known as: CHRONULAC Take 45 mLs (30 g total) by mouth daily as needed for mild constipation or severe constipation.   LORazepam 0.5 MG tablet Commonly known as: ATIVAN Take 2 tablets (1 mg total) by mouth every 8 (eight) hours as needed for anxiety.   metFORMIN 1000 MG tablet Commonly known as: GLUCOPHAGE TAKE 1 TABLET TWICE DAILY WITH MEALS What changed: when to take this   pantoprazole 40 MG tablet Commonly known as: PROTONIX TAKE 1 TABLET TWICE DAILY   predniSONE 10 MG tablet Commonly known as: DELTASONE 3 tabs po daily day1,2; 2 tabs po daily day3,4; 1 tab po daily day5,6; 1/2 tab po daily day7,8 What changed:   medication strength  how much to take  how to take this  when to take this  additional  instructions   QUEtiapine 25 MG tablet Commonly known as: SEROQUEL Take 1 tablet (25 mg total) by mouth at bedtime as needed (insomnia).   saccharomyces boulardii 250 MG capsule Commonly known as: FLORASTOR Take 250 mg by mouth 2 (two) times daily.   Trelegy Ellipta 100-62.5-25 MCG/INH Aepb Generic drug: Fluticasone-Umeclidin-Vilant INHALE 1 PUFF INTO THE LUNGS DAILY.   Tyler Aas FlexTouch 100 UNIT/ML FlexTouch Pen Generic drug: insulin degludec Inject 0.12 mLs (12 Units total) into the skin at bedtime. What changed: See the new instructions.   triamcinolone ointment 0.1 % Commonly known as: KENALOG Apply 1 application topically 2 (two) times daily.   venlafaxine XR 150 MG 24 hr capsule Commonly known as: EFFEXOR-XR Take 1 capsule (150 mg total) by mouth daily.            Durable Medical Equipment  (From admission, onward)         Start     Ordered   12/20/19 1012  For home use only DME oxygen  Once    Question Answer Comment  Length of Need Lifetime   Mode or (Route) Nasal cannula   Liters per Minute 2   Frequency Continuous (stationary and portable oxygen unit needed)   Oxygen conserving device Yes   Oxygen delivery system Gas      12/20/19 1011           DISCHARGE INSTRUCTIONS:   Follow-up PMD 5 days  If you experience worsening of your admission symptoms, develop shortness of breath, life threatening emergency, suicidal or homicidal thoughts you must seek medical attention immediately by calling 911 or calling your MD immediately  if symptoms less severe.  You Must read complete instructions/literature along with all the possible adverse reactions/side effects for all the Medicines you take and that have been prescribed to you. Take any new Medicines after you have completely understood and accept all the possible adverse reactions/side effects.   Please note  You were cared for by a hospitalist during your hospital stay. If you have any questions  about your discharge medications or the care you received while you were in the hospital after you are discharged, you can call the unit and asked  to speak with the hospitalist on call if the hospitalist that took care of you is not available. Once you are discharged, your primary care physician will handle any further medical issues. Please note that NO REFILLS for any discharge medications will be authorized once you are discharged, as it is imperative that you return to your primary care physician (or establish a relationship with a primary care physician if you do not have one) for your aftercare needs so that they can reassess your need for medications and monitor your lab values.    Today   CHIEF COMPLAINT:   Chief Complaint  Patient presents with  . Back Pain    HISTORY OF PRESENT ILLNESS:  Jordan Chan  is a 69 y.o. female came in with back pain and found to have pneumonia   VITAL SIGNS:  Blood pressure (!) 141/73, pulse 96, temperature 98.1 F (36.7 C), temperature source Oral, resp. rate 12, height 4\' 10"  (1.473 m), weight 66 kg, SpO2 97 %.  I/O:    Intake/Output Summary (Last 24 hours) at 12/20/2019 1718 Last data filed at 12/20/2019 0900 Gross per 24 hour  Intake 360 ml  Output 600 ml  Net -240 ml    PHYSICAL EXAMINATION:  GENERAL:  69 y.o.-year-old patient lying in the bed with no acute distress.  EYES: Pupils equal, round, reactive to light and accommodation. No scleral icterus. Extraocular muscles intact.  HEENT: Head atraumatic, normocephalic. Oropharynx and nasopharynx clear.  LUNGS: Decreased breath sounds bilaterally, no wheezing, rales,rhonchi or crepitation. No use of accessory muscles of respiration.  CARDIOVASCULAR: S1, S2 normal. No murmurs, rubs, or gallops.  ABDOMEN: Soft, non-tender, non-distended. Bowel sounds present. No organomegaly or mass.  EXTREMITIES: No pedal edema, cyanosis, or clubbing.  NEUROLOGIC: Cranial nerves II through XII are  intact. Muscle strength 5/5 in all extremities. Sensation intact. Gait not checked.  PSYCHIATRIC: The patient is alert and oriented x 3.  SKIN: No obvious rash, lesion, or ulcer.   DATA REVIEW:   CBC Recent Labs  Lab 12/19/19 0441  WBC 10.5  HGB 8.3*  HCT 27.5*  PLT 460*    Chemistries  Recent Labs  Lab 12/19/19 0441  NA 139  K 3.9  CL 99  CO2 32  GLUCOSE 146*  BUN 10  CREATININE 0.64  CALCIUM 8.3*  MG 2.0     Microbiology Results  Results for orders placed or performed during the hospital encounter of 12/11/19  Blood Culture (routine x 2)     Status: None   Collection Time: 12/11/19  2:05 PM   Specimen: BLOOD  Result Value Ref Range Status   Specimen Description BLOOD R UPPER ARM  Final   Special Requests   Final    BOTTLES DRAWN AEROBIC AND ANAEROBIC Blood Culture adequate volume   Culture   Final    NO GROWTH 5 DAYS Performed at [facility], [address] [place], [place] [address]    Report Status 12/16/2019 FINAL  Final  Blood Culture (routine x 2)     Status: None   Collection Time: 12/11/19  2:06 PM   Specimen: BLOOD  Result Value Ref Range Status   Specimen Description BLOOD RIGHT ANTECUBITAL  Final   Special Requests   Final    BOTTLES DRAWN AEROBIC AND ANAEROBIC Blood Culture results may not be optimal due to an inadequate volume of blood received in culture bottles   Culture   Final    NO GROWTH 5 DAYS Performed at  [facility], [address] [place], [place] [address]    Report Status 12/16/2019 FINAL  Final  Urine culture     Status: Abnormal   Collection Time: 12/11/19  6:10 PM   Specimen: In/Out Cath Urine  Result Value Ref Range Status   Specimen Description   Final    IN/OUT CATH URINE Performed at [facility], [address], [place], [place] [address]    Special Requests   Final    NONE Performed at [facility], [address] [place], [place] [address]    Culture (A)  Final     2,000 COLONIES/mL STAPHYLOCOCCUS HAEMOLYTICUS 2,000 COLONIES/mL ENTEROCOCCUS FAECALIS 10,000 COLONIES/mL GROUP B STREP(S.AGALACTIAE)ISOLATED TESTING AGAINST S. AGALACTIAE NOT ROUTINELY PERFORMED DUE TO PREDICTABILITY OF AMP/PEN/VAN SUSCEPTIBILITY. Performed at [facility], [place] [address] [place], [place] [address]    Report Status 12/14/2019 FINAL  Final   Organism ID, Bacteria STAPHYLOCOCCUS HAEMOLYTICUS (A)  Final   Organism ID, Bacteria ENTEROCOCCUS FAECALIS (A)  Final      Susceptibility   Enterococcus faecalis - MIC*    AMPICILLIN <=2 SENSITIVE Sensitive     NITROFURANTOIN <=16 SENSITIVE Sensitive     VANCOMYCIN 1 SENSITIVE Sensitive     * 2,000 COLONIES/mL ENTEROCOCCUS FAECALIS   Staphylococcus haemolyticus - MIC*    CIPROFLOXACIN <=0.5 SENSITIVE Sensitive     GENTAMICIN <=0.5 SENSITIVE Sensitive     NITROFURANTOIN <=16 SENSITIVE Sensitive     OXACILLIN <=0.25 SENSITIVE Sensitive     TETRACYCLINE <=1 SENSITIVE Sensitive     VANCOMYCIN <=0.5 SENSITIVE Sensitive     TRIMETH/SULFA <=10 SENSITIVE Sensitive     CLINDAMYCIN >=8 RESISTANT Resistant     RIFAMPIN <=0.5 SENSITIVE Sensitive     Inducible Clindamycin NEGATIVE Sensitive     * 2,000 COLONIES/mL STAPHYLOCOCCUS HAEMOLYTICUS  Respiratory Panel by RT PCR (Flu A&B, Covid) - Nasopharyngeal Swab     Status: None   Collection Time: 12/11/19  6:10 PM   Specimen: Nasopharyngeal Swab  Result Value Ref Range Status   SARS Coronavirus 2 by RT PCR NEGATIVE NEGATIVE Final    Comment: (NOTE) SARS-CoV-2 target nucleic acids are NOT DETECTED. The SARS-CoV-2 RNA is generally detectable in upper respiratoy specimens during the acute phase of infection. The lowest concentration of SARS-CoV-2 viral copies this assay can detect is 131 copies/mL. A negative result does not preclude SARS-Cov-2 infection and should not be used as the sole basis for treatment or other patient management decisions. A negative result may occur with   improper specimen collection/handling, submission of specimen other than nasopharyngeal swab, presence of viral mutation(s) within the areas targeted by this assay, and inadequate number of viral copies (<131 copies/mL). A negative result must be combined with clinical observations, patient history, and epidemiological information. The expected result is Negative. Fact Sheet for Patients:  PinkCheek.be Fact Sheet for Healthcare Providers:  GravelBags.it This test is not yet ap proved or cleared by the [place] FDA and  has been authorized for detection and/or diagnosis of SARS-CoV-2 by FDA under an Emergency Use Authorization (EUA). This EUA will remain  in effect (meaning this test can be used) for the duration of the COVID-19 declaration under Section 564(b)(1) of the Act, 21 U.S.C. section 360bbb-3(b)(1), unless the authorization is terminated or revoked sooner.    Influenza A by PCR NEGATIVE NEGATIVE Final   Influenza B by PCR NEGATIVE NEGATIVE Final    Comment: (NOTE) The Xpert Xpress SARS-CoV-2/FLU/RSV assay is intended as  an aid in  the diagnosis of influenza from Nasopharyngeal swab specimens and  should not be used as a sole basis for treatment. Nasal washings and  aspirates are unacceptable for Xpert Xpress SARS-CoV-2/FLU/RSV  testing. Fact Sheet for Patients: PinkCheek.be Fact Sheet for Healthcare Providers: GravelBags.it This test is not yet approved or cleared by the [place] FDA and  has been authorized for detection and/or diagnosis of SARS-CoV-2 by  FDA under an Emergency Use Authorization (EUA). This EUA will remain  in effect (meaning this test can be used) for the duration of the  Covid-19 declaration under Section 564(b)(1) of the Act, 21  U.S.C. section 360bbb-3(b)(1), unless the authorization is  terminated or revoked. Performed at  [facility], [address] [place], [place] [address]   MRSA PCR Screening     Status: None   Collection Time: 12/12/19 11:13 AM   Specimen: Nasopharyngeal  Result Value Ref Range Status   MRSA by PCR NEGATIVE NEGATIVE Final    Comment:        The GeneXpert MRSA Assay (FDA approved for NASAL specimens only), is one component of a comprehensive MRSA colonization surveillance program. It is not intended to diagnose MRSA infection nor to guide or monitor treatment for MRSA infections. Performed at [facility], [address] [place], [place] [address]   Aerobic/Anaerobic Culture (surgical/deep wound)     Status: None (Preliminary result)   Collection Time: 12/18/19 11:47 AM   Specimen: Throat  Result Value Ref Range Status   Specimen Description   Final    THROAT Performed at [facility], [address], [place], [place] [address]    Special Requests   Final    Immunocompromised Performed at [facility], [address] [place], [place] [address]    Gram Stain   Final    NO WBC SEEN NO ORGANISMS SEEN Performed at [facility], [place] [address] [place], [place] [address]    Culture   Final    CULTURE REINCUBATED FOR BETTER GROWTH NO ANAEROBES ISOLATED; CULTURE IN PROGRESS FOR 5 DAYS    Report Status PENDING  Incomplete      Management plans discussed with the patient, family (daughter yesterday) and they are in agreement.  CODE STATUS:     Code Status Orders  (From admission, onward)         Start     Ordered   12/12/19 0558  Full code  Continuous     12/12/19 0557        Code Status History    Date Active Date Inactive Code Status Order ID Comments User Context   04/25/2017 1615 05/01/2017 1724 Full Code [id number]  Idelle Crouch, MD ED   Advance Care Planning Activity    Advance Directive Documentation     Most Recent Value  Type of Advance Directive  Healthcare Power of Attorney  Pre-existing out  of facility DNR order (yellow form or pink MOST form)  --  "MOST" Form in Place?  --      TOTAL TIME TAKING CARE OF THIS PATIENT: 35 minutes.    Loletha Grayer M.D on 12/20/2019 at 5:18 PM  Between 7am to 6pm - Pager - [phone]  After 6pm go to [email or web address] - password [email or web address]  [facility]  CC: Primary care physician; [name], NP

## 2019-12-20 NOTE — Progress Notes (Signed)
Discharge order received. Patient mental status is at baseline. Vital signs stable . No signs of acute distress. Discharge instructions given. Patient verbalized understanding.  Pt was discharged with portbale O2. No other issues noted.

## 2019-12-20 NOTE — TOC Transition Note (Signed)
Transition of Care [facility] - CM/SW Discharge Note   Patient Details  Name: Jordan Chan MRN: [id number] Date of Birth: 12/24/1950  [facility] CM/SW Contact:  Beverly Sessions, RN Phone Number: 12/20/2019, 1:36 PM   Clinical Narrative:     Patient to discharge home today Patient qualifies for continuous O2.  Tonya/James at [employer] notified.  Portable O2 to be delivered at bedside prior to discharge.   Patient to discharge with home health services through [facility].  Cory with [facility] notified.   Final next level of care: Home w Home Health Services Barriers to Discharge: No Barriers Identified   Patient Goals and CMS Choice     Choice offered to / list presented to : Patient  Discharge Placement                       Discharge Plan and Services   Discharge Planning Services: CM Consult            DME Arranged: Oxygen   Date DME Agency Contacted: 12/20/19   Representative spoke with at DME Agency: Huey Romans HH Arranged: Disease Management, OT, Nurse's Aide [place] Agency: [place] Date [facility] Agency Contacted: 12/20/19 Time [place]: 1121 Representative spoke with at [place]: cory  Social Determinants of Health ([place]) Interventions     Readmission Risk Interventions No flowsheet data found.

## 2019-12-21 ENCOUNTER — Telehealth: Payer: Self-pay | Admitting: Nurse Practitioner

## 2019-12-21 ENCOUNTER — Telehealth: Payer: Self-pay

## 2019-12-21 ENCOUNTER — Ambulatory Visit (INDEPENDENT_AMBULATORY_CARE_PROVIDER_SITE_OTHER): Payer: Medicare HMO | Admitting: Pharmacist

## 2019-12-21 DIAGNOSIS — J449 Chronic obstructive pulmonary disease, unspecified: Secondary | ICD-10-CM | POA: Diagnosis not present

## 2019-12-21 DIAGNOSIS — E1159 Type 2 diabetes mellitus with other circulatory complications: Secondary | ICD-10-CM | POA: Diagnosis not present

## 2019-12-21 DIAGNOSIS — I10 Essential (primary) hypertension: Secondary | ICD-10-CM | POA: Diagnosis not present

## 2019-12-21 DIAGNOSIS — E1140 Type 2 diabetes mellitus with diabetic neuropathy, unspecified: Secondary | ICD-10-CM | POA: Diagnosis not present

## 2019-12-21 DIAGNOSIS — I152 Hypertension secondary to endocrine disorders: Secondary | ICD-10-CM

## 2019-12-21 DIAGNOSIS — Z794 Long term (current) use of insulin: Secondary | ICD-10-CM

## 2019-12-21 NOTE — Telephone Encounter (Signed)
Transition Care Management Follow-up Telephone Call  Date of discharge and from where: 12/20/2019 [facility]   How have you been since you were released from the hospital? "not good, my mouth is so sore"  Any questions or concerns? Yes, discussed making sure to use the medications they prescribed, also states she is able to eat a little soft foods and drink some fluids. States she did not like the Glucerna but is willing to try ensure or boost  Items Reviewed:  Did the pt receive and understand the discharge instructions provided? Yes   Medications obtained and verified? Yes   Any new allergies since your discharge? No   Dietary orders reviewed? Yes  Do you have support at home? Yes   Functional Questionnaire: (I = Independent and D = Dependent) ADLs:   Bathing/Dressing- i  Meal Prep- i  Eating- i  Maintaining continence- i  Transferring/Ambulation- i  Managing Meds- i  Follow up appointments reviewed:   PCP Hospital f/u appt confirmed? Yes  Scheduled to see Jolene [name],NP  on 12/23/2019.  [facility] f/u appt confirmed? Yes    Are transportation arrangements needed? No   If their condition worsens, is the pt aware to call PCP or go to the Emergency Dept.? yes  Was the patient provided with contact information for the PCP's office or ED? Yes  Was to pt encouraged to call back with questions or concerns? Yes

## 2019-12-21 NOTE — Chronic Care Management (AMB) (Signed)
Chronic Care Management   Follow Up Note   12/21/2019 Name: Jordan Chan MRN: [id number] DOB: 10/26/1950  Referred by: [name], NP Reason for referral : Chronic Care Management (Medication Management)   [name] is a 69 y.o. year old female who is a primary care patient of [name], [name], NP. The CCM team was consulted for assistance with chronic disease management and care coordination needs.    Review of patient status, including review of consultants reports, relevant laboratory and other test results, and collaboration with appropriate care team members and the patient's provider was performed as part of comprehensive patient evaluation and provision of chronic care management services.    SDOH (Social Determinants of Health) assessments performed: Yes See Care Plan activities for detailed interventions related to [facility])     Outpatient Encounter Medications as of 12/21/2019  Medication Sig  . Dulaglutide (TRULICITY) 1.5 0000000 SOPN Inject 1.5 mg into the skin once a week.  . fluconazole (DIFLUCAN) 100 MG tablet Take 1 tablet (100 mg total) by mouth daily.  . predniSONE (DELTASONE) 10 MG tablet 3 tabs po daily day1,2; 2 tabs po daily day3,4; 1 tab po daily day5,6; 1/2 tab po daily day7,8  . ACCU-CHEK AVIVA PLUS test strip TEST THREE TIMES DAILY  . Accu-Chek Softclix Lancets lancets TEST BLOOD SUGAR THREE TIMES DAILY  . albuterol (PROVENTIL HFA;VENTOLIN HFA) 108 (90 BASE) MCG/ACT inhaler Inhale 2 puffs into the lungs every 6 (six) hours as needed for wheezing or shortness of breath.  [name] atorvastatin (LIPITOR) 20 MG tablet Take 1 tablet (20 mg total) by mouth daily.  . benzocaine (ORAJEL) 10 % mucosal gel Use as directed in the mouth or throat 4 (four) times daily as needed for mouth pain.  . cholecalciferol (VITAMIN D3) 25 MCG (1000 UNIT) tablet Take 1,000 Units by mouth daily.  . clotrimazole (CLOTRIMAZOLE ANTI-FUNGAL) 1 % cream Apply 1 application  topically 2 (two) times daily.  . cyclobenzaprine (FLEXERIL) 5 MG tablet Take 5 mg by mouth 2 (two) times daily as needed for muscle spasms.   [name] diltiazem (CARDIZEM CD) 180 MG 24 hr capsule Take 1 capsule (180 mg total) by mouth daily.  [name] gabapentin (NEURONTIN) 800 MG tablet Take 1 tablet (800 mg total) by mouth 3 (three) times daily.  . Glucerna (GLUCERNA) LIQD Take 237 mLs by mouth daily.  . insulin degludec (TRESIBA FLEXTOUCH) 100 UNIT/ML FlexTouch Pen Inject 0.12 mLs (12 Units total) into the skin at bedtime.  [name] lactulose (CHRONULAC) 10 GM/15ML solution Take 45 mLs (30 g total) by mouth daily as needed for mild constipation or severe constipation.  [name] LORazepam (ATIVAN) 0.5 MG tablet Take 2 tablets (1 mg total) by mouth every 8 (eight) hours as needed for anxiety.  . metFORMIN (GLUCOPHAGE) 1000 MG tablet TAKE 1 TABLET TWICE DAILY WITH MEALS (Patient taking differently: Take 1,000 mg by mouth 2 (two) times daily. )  . pantoprazole (PROTONIX) 40 MG tablet TAKE 1 TABLET TWICE DAILY (Patient taking differently: Take 40 mg by mouth 2 (two) times daily. )  . QUEtiapine (SEROQUEL) 25 MG tablet Take 1 tablet (25 mg total) by mouth at bedtime as needed (insomnia).  . saccharomyces boulardii (FLORASTOR) 250 MG capsule Take 250 mg by mouth 2 (two) times daily.  . TRELEGY ELLIPTA 100-62.5-25 MCG/INH AEPB INHALE 1 PUFF INTO THE LUNGS DAILY.  [name] triamcinolone ointment (KENALOG) 0.1 % Apply 1 application topically 2 (two) times daily.  [name] venlafaxine XR (EFFEXOR-XR) 150 MG  24 hr capsule Take 1 capsule (150 mg total) by mouth daily.   No facility-administered encounter medications on file as of 12/21/2019.     Objective:   Goals Addressed            This Visit's Progress     Patient Stated   . "I want to get my blood sugars under control" (pt-stated)       CARE PLAN ENTRY (see longtitudinal plan of care for additional care plan information)  Current Barriers:  . Diabetes: controlled, most recent  A1c 6.6%  o Recent admission to [facility] 5/9-5/18/21 for pneumonia and sepsis. Developed severe mouth sores that are painful, greatly impacting appetite and hydration. Reports that she hasn't picked up her medications from [place] yet because the pharmacy was closed by the time she got there yesterday, but will go by today o Medication changes at discharge: noted to stop Trulicity, though no documented reason why; hold lisinopril d/t hypotension, metoprolol changed to diltiazem d/t SVT. ASA discontinued d/t anemia. Patient notes that she restarted Trulicity last night. o Patient tearful today about her hospital stay. Reports concern about being on oxygen forever, but does report the drop in O2 sats w/ ambulation at the hospital. Tearful about the pain in her mouth.  . Current antihyperglycemic regimen: Trulicity 1.5 mg, metformin 1000 mg BID, Tresiba 18-20 units daily usually (notes she will use smaller doses, ~12-14, if sugars low going into bedtime) . Cardiovascular risk reduction: o Current hypertensive regimen: diltiazem 180 mg daily  o Current hyperlipidemia regimen: atorvastatin 20 mg daily, LDL at goal <70;  o Current antiplatelet regimen: none right now . COPD; Trelegy QAM. Does rinse mouth out after use; Little need for albuterol lately. Follows w/ [name] at [facility] pulmonary. . Chronic pain: follows w/ pain management; gabapentin 800 mg TID, diclofenac 50 mg TID, cyclobenzaprine 5 mg BID PRN  . Mood: venlafaxine XR 150 mg daily   Pharmacist Clinical Goal(s):  [name] Over the next 90 days, patient with work with PharmD and primary care provider to address optimized diabetes management  Interventions: . Comprehensive medication review performed, medication list updated in electronic medical record . Inter-disciplinary care team collaboration (see longitudinal plan of care) . Reviewed medication changes at discharge. I believe appropriate for her to have restarted Trulicity at this time, as patient  anticipates appetite improving as mouth sores resolves.  . Reviewed that prednisone will raise her blood sugars, and maintaining tight glycemic control should help resolution of mouth sores, if fungal. She verbalized understanding . Reviewed change in BP medications. Will re-evaluate w/ PCP at hospital f/u.  [name] Reviewed to continue to hold ASA until instructed otherwise.  Liana Gerold LCSW call. Patient declined at this time, but noted she would let me know if she felt she needed this service in the future.  . Will collaborate w/ nurse health advisor and PCP for [facility] and hospital f/u scheduling.   Patient Self Care Activities:  . Patient will check blood glucose BID, document, and provide at future appointments . Patient will report any questions or concerns to provider   Please see past updates related to this goal by clicking on the "Past Updates" button in the selected goal          Plan:  - Scheduled f/u call in ~ 4 weeks  Catie Darnelle Maffucci, PharmD, [place] [phone]

## 2019-12-21 NOTE — Telephone Encounter (Signed)
Noted  

## 2019-12-21 NOTE — Telephone Encounter (Signed)
Left voicemail to make this hosp f.u apt.

## 2019-12-21 NOTE — Telephone Encounter (Signed)
-----   Message from [name], NP sent at 12/20/2019  6:35 PM EDT ----- Needs hospital follow-up 30 minute visit please

## 2019-12-21 NOTE — Patient Instructions (Signed)
Visit Information  Goals Addressed            This Visit's Progress     Patient Stated   . "I want to get my blood sugars under control" (pt-stated)       CARE PLAN ENTRY (see longtitudinal plan of care for additional care plan information)  Current Barriers:  . Diabetes: controlled, most recent A1c 6.6%  o Recent admission to [facility] 5/9-5/18/21 for pneumonia and sepsis. Developed severe mouth sores that are painful, greatly impacting appetite and hydration. Reports that she hasn't picked up her medications from [place] yet because the pharmacy was closed by the time she got there yesterday, but will go by today o Medication changes at discharge: noted to stop Trulicity, though no documented reason why; hold lisinopril d/t hypotension, metoprolol changed to diltiazem d/t SVT. ASA discontinued d/t anemia. Patient notes that she restarted Trulicity last night. o Patient tearful today about her hospital stay. Reports concern about being on oxygen forever, but does report the drop in O2 sats w/ ambulation at the hospital. Tearful about the pain in her mouth.  . Current antihyperglycemic regimen: Trulicity 1.5 mg, metformin 1000 mg BID, Tresiba 18-20 units daily usually (notes she will use smaller doses, ~12-14, if sugars low going into bedtime) . Cardiovascular risk reduction: o Current hypertensive regimen: diltiazem 180 mg daily  o Current hyperlipidemia regimen: atorvastatin 20 mg daily, LDL at goal <70;  o Current antiplatelet regimen: none right now . COPD; Trelegy QAM. Does rinse mouth out after use; Little need for albuterol lately. Follows w/ [name] at [facility] pulmonary. . Chronic pain: follows w/ pain management; gabapentin 800 mg TID, diclofenac 50 mg TID, cyclobenzaprine 5 mg BID PRN  . Mood: venlafaxine XR 150 mg daily   Pharmacist Clinical Goal(s):  [name] Over the next 90 days, patient with work with PharmD and primary care provider to address optimized diabetes  management  Interventions: . Comprehensive medication review performed, medication list updated in electronic medical record . Inter-disciplinary care team collaboration (see longitudinal plan of care) . Reviewed medication changes at discharge. I believe appropriate for her to have restarted Trulicity at this time, as patient anticipates appetite improving as mouth sores resolves.  . Reviewed that prednisone will raise her blood sugars, and maintaining tight glycemic control should help resolution of mouth sores, if fungal. She verbalized understanding . Reviewed change in BP medications. Will re-evaluate w/ PCP at hospital f/u.  [name] Reviewed to continue to hold ASA until instructed otherwise.  Liana Gerold LCSW call. Patient declined at this time, but noted she would let me know if she felt she needed this service in the future.  . Will collaborate w/ nurse health advisor and PCP for [facility] and hospital f/u scheduling.   Patient Self Care Activities:  . Patient will check blood glucose BID, document, and provide at future appointments . Patient will report any questions or concerns to provider   Please see past updates related to this goal by clicking on the "Past Updates" button in the selected goal         Patient verbalizes understanding of instructions provided today.   Plan:  - Scheduled f/u call in ~ 4 weeks  Catie Darnelle Maffucci, PharmD, [place] [phone]

## 2019-12-22 ENCOUNTER — Inpatient Hospital Stay: Payer: Medicare HMO | Admitting: Nurse Practitioner

## 2019-12-23 ENCOUNTER — Ambulatory Visit (INDEPENDENT_AMBULATORY_CARE_PROVIDER_SITE_OTHER): Payer: Medicare HMO | Admitting: Nurse Practitioner

## 2019-12-23 ENCOUNTER — Other Ambulatory Visit: Payer: Self-pay

## 2019-12-23 ENCOUNTER — Telehealth: Payer: Self-pay | Admitting: Nurse Practitioner

## 2019-12-23 ENCOUNTER — Encounter: Payer: Self-pay | Admitting: Nurse Practitioner

## 2019-12-23 VITALS — BP 134/82 | HR 89 | Temp 98.0°F | Wt 140.0 lb

## 2019-12-23 DIAGNOSIS — F324 Major depressive disorder, single episode, in partial remission: Secondary | ICD-10-CM

## 2019-12-23 DIAGNOSIS — I152 Hypertension secondary to endocrine disorders: Secondary | ICD-10-CM

## 2019-12-23 DIAGNOSIS — E785 Hyperlipidemia, unspecified: Secondary | ICD-10-CM

## 2019-12-23 DIAGNOSIS — E6609 Other obesity due to excess calories: Secondary | ICD-10-CM

## 2019-12-23 DIAGNOSIS — Z6830 Body mass index (BMI) 30.0-30.9, adult: Secondary | ICD-10-CM

## 2019-12-23 DIAGNOSIS — K120 Recurrent oral aphthae: Secondary | ICD-10-CM

## 2019-12-23 DIAGNOSIS — Z794 Long term (current) use of insulin: Secondary | ICD-10-CM

## 2019-12-23 DIAGNOSIS — J9621 Acute and chronic respiratory failure with hypoxia: Secondary | ICD-10-CM

## 2019-12-23 DIAGNOSIS — G8929 Other chronic pain: Secondary | ICD-10-CM

## 2019-12-23 DIAGNOSIS — I10 Essential (primary) hypertension: Secondary | ICD-10-CM

## 2019-12-23 DIAGNOSIS — E1140 Type 2 diabetes mellitus with diabetic neuropathy, unspecified: Secondary | ICD-10-CM

## 2019-12-23 DIAGNOSIS — J189 Pneumonia, unspecified organism: Secondary | ICD-10-CM

## 2019-12-23 DIAGNOSIS — J449 Chronic obstructive pulmonary disease, unspecified: Secondary | ICD-10-CM | POA: Diagnosis not present

## 2019-12-23 DIAGNOSIS — E1159 Type 2 diabetes mellitus with other circulatory complications: Secondary | ICD-10-CM

## 2019-12-23 DIAGNOSIS — G4733 Obstructive sleep apnea (adult) (pediatric): Secondary | ICD-10-CM

## 2019-12-23 DIAGNOSIS — E1169 Type 2 diabetes mellitus with other specified complication: Secondary | ICD-10-CM | POA: Diagnosis not present

## 2019-12-23 DIAGNOSIS — M545 Low back pain, unspecified: Secondary | ICD-10-CM

## 2019-12-23 DIAGNOSIS — E66811 Obesity, class 1: Secondary | ICD-10-CM

## 2019-12-23 LAB — AEROBIC/ANAEROBIC CULTURE W GRAM STAIN (SURGICAL/DEEP WOUND): Gram Stain: NONE SEEN

## 2019-12-23 NOTE — Assessment & Plan Note (Signed)
Recommended eating smaller high protein, low fat meals more frequently and exercising 30 mins a day 5 times a week with a goal of 10-15lb weight loss in the next 3 months. Patient voiced their understanding and motivation to adhere to these recommendations.  

## 2019-12-23 NOTE — Assessment & Plan Note (Signed)
Ongoing, continue current Prednisone and Diflucan as prescribed at discharge from hospital.  If ongoing refer to ENT -- she saw Dr. Richardson Landry in hospital.

## 2019-12-23 NOTE — Assessment & Plan Note (Signed)
Poor tolerance of CPAP mask, uses O2 night 2.5 L [place].

## 2019-12-23 NOTE — Assessment & Plan Note (Signed)
Chronic, ongoing, insulin dependent. A1C 7.1% in May in hospital, now BS trending upwards at home due to Prednisone.  Reassured patient.  Will continue Trulicity, Tresiba, and Metformin at this time.  Recommend she continue to monitor BS consistently at home and document + focus heavily on diet changes.  Could consider increase in Trulicity next visit if elevation noted.  Return in 3 months.

## 2019-12-23 NOTE — Assessment & Plan Note (Signed)
Continue to collaborate with chronic pain clinic in [place].  Has morphine pump.   

## 2019-12-23 NOTE — Patient Instructions (Signed)
Community-Acquired Pneumonia, Adult Pneumonia is an infection of the lungs. It causes swelling in the airways of the lungs. Mucus and fluid may also build up inside the airways. One type of pneumonia can happen while a person is in a hospital. A different type can happen when a person is not in a hospital (community-acquired pneumonia).  What are the causes?  This condition is caused by germs (viruses, bacteria, or fungi). Some types of germs can be passed from one person to another. This can happen when you breathe in droplets from the cough or sneeze of an infected person. What increases the risk? You are more likely to develop this condition if you:  Have a long-term (chronic) disease, such as: ? Chronic obstructive pulmonary disease (COPD). ? Asthma. ? Cystic fibrosis. ? Congestive heart failure. ? Diabetes. ? Kidney disease.  Have HIV.  Have sickle cell disease.  Have had your spleen removed.  Do not take good care of your teeth and mouth (poor dental hygiene).  Have a medical condition that increases the risk of breathing in droplets from your own mouth and nose.  Have a weakened body defense system (immune system).  Are a smoker.  Travel to areas where the germs that cause this illness are common.  Are around certain animals or the places they live. What are the signs or symptoms?  A dry cough.  A wet (productive) cough.  Fever.  Sweating.  Chest pain. This often happens when breathing deeply or coughing.  Fast breathing or trouble breathing.  Shortness of breath.  Shaking chills.  Feeling tired (fatigue).  Muscle aches. How is this treated? Treatment for this condition depends on many things. Most adults can be treated at home. In some cases, treatment must happen in a hospital. Treatment may include:  Medicines given by mouth or through an IV tube.  Being given extra oxygen.  Respiratory therapy. In rare cases, treatment for very bad pneumonia  may include:  Using a machine to help you breathe.  Having a procedure to remove fluid from around your lungs. Follow these instructions at home: Medicines  Take over-the-counter and prescription medicines only as told by your doctor. ? Only take cough medicine if you are losing sleep.  If you were prescribed an antibiotic medicine, take it as told by your doctor. Do not stop taking the antibiotic even if you start to feel better. General instructions   Sleep with your head and neck raised (elevated). You can do this by sleeping in a recliner or by putting a few pillows under your head.  Rest as needed. Get at least 8 hours of sleep each night.  Drink enough water to keep your pee (urine) pale yellow.  Eat a healthy diet that includes plenty of vegetables, fruits, whole grains, low-fat dairy products, and lean protein.  Do not use any products that contain nicotine or tobacco. These include cigarettes, e-cigarettes, and chewing tobacco. If you need help quitting, ask your doctor.  Keep all follow-up visits as told by your doctor. This is important. How is this prevented? A shot (vaccine) can help prevent pneumonia. Shots are often suggested for:  People older than 70 years of age.  People older than 70 years of age who: ? Are having cancer treatment. ? Have long-term (chronic) lung disease. ? Have problems with their body's defense system. You may also prevent pneumonia if you take these actions:  Get the flu (influenza) shot every year.  Go to the dentist as   often as told.  Wash your hands often. If you cannot use soap and water, use hand sanitizer. Contact a doctor if:  You have a fever.  You lose sleep because your cough medicine does not help. Get help right away if:  You are short of breath and it gets worse.  You have more chest pain.  Your sickness gets worse. This is very serious if: ? You are an older adult. ? Your body's defense system is weak.  You  cough up blood. Summary  Pneumonia is an infection of the lungs.  Most adults can be treated at home. Some will need treatment in a hospital.  Drink enough water to keep your pee pale yellow.  Get at least 8 hours of sleep each night. This information is not intended to replace advice given to you by your health care provider. Make sure you discuss any questions you have with your health care provider. Document Revised: 11/10/2018 Document Reviewed: 03/18/2018 [employer] Patient Education  2020 [employer]  

## 2019-12-23 NOTE — Progress Notes (Signed)
BP 134/82   Pulse 89   Temp 98 F (36.7 C) (Oral)   Wt 140 lb (63.5 kg)   LMP  (LMP Unknown)   SpO2 96%   BMI 29.26 kg/m    Subjective:    [name], female    DOB: November 01, 1950, 69 y.o.   MRN: [id number]  HPI: Jordan Chan is a 69 y.o. female  Chief Complaint  Patient presents with  . Hospitalization Follow-up   Transition of Care Hospital Follow up.  Was sent home on O2 due to failing walk test, O2 sats dropped to 60's.  She follows up with [name] next week.  She reports [facility] called about PT this morning and she does not want to do this right now, too overwhelming, does not want to start until she sees [name] next week.  Does not like people coming in her house and telling her what to do.  She reports overall feeling better today, except for fatigue.    HOSPITAL COURSE: 1.  Clinical sepsis with lobar pneumonia.  Patient finished her entire course of antibiotics here in the hospital. 2.  Acute on now chronic hypoxic respiratory failure.  The patient qualified for home oxygen and was set up for 24/7 oxygen prior to disposition.  Pulse ox dropped into the 80s with ambulation. 3.  Tongue and mouth ulcerations.  Aphthous ulcers and thrush seen.  The patient was given IV Diflucan.  Patient was given viscous lidocaine benzocaine gel Magic mouthwash.  Patient feeling better with regards to her mouth and now able to eat.  I actually got ENT to see the patient and they recommended steroids.  I will give a prednisone taper and Diflucan a few more days after the prednisone finishes.  Benzocaine gel prescribed upon discharge also. 4.  Hypokalemia (3.9 on discharge) and hypomagnesemia (2.0 on discharge).  These have both been replaced during the hospital course. 5.  COPD.  Lungs are clear upon discharge.  Now has chronic respiratory failure on oxygen 6.  Chronic back pain with intrathecal pump 7.  Type 2 diabetes mellitus on Tresiba insulin at  home can go back on this.  Sugars will be a little higher while on steroids. 8.  History of right-sided breast cancer 9.  Hypotension secondary to not eating and tachycardia with episodes of SVT.  I changed her Toprol over to Cardizem CD.  Heart rate better once her pain in her mouth has settled down. 10.  Constipation.  I did give a dose of Relistor earlier in the hospitalization which helped her constipation.  We will give lactulose as needed upon discharge. 11.  Iron deficiency anemia.  I did give IV Venofer during the hospital course.  Hemoglobin 8.3 upon discharge 12.  Insomnia.  Patient finally slept with Seroquel at night.  Will make this as needed at home 13.  Severe anxiety as needed Ativan.  Hospital/Facility: [facility] D/C Physician: Dr. Leslye Peer D/C Date: 12/20/19  Records Requested: 12/22/19 Records Received: 12/22/19 Records Reviewed: 12/22/19  Diagnoses on Discharge: Sepsis  Date of interactive Contact within 48 hours of discharge:  Contact was through: phone  Date of 7 day or 14 day face-to-face visit:    within 7 days  Outpatient Encounter Medications as of 12/23/2019  Medication Sig  . ACCU-CHEK AVIVA PLUS test strip TEST THREE TIMES DAILY  . Accu-Chek Softclix Lancets lancets TEST BLOOD SUGAR THREE TIMES DAILY  . albuterol (PROVENTIL HFA;VENTOLIN HFA) 108 (90  BASE) MCG/ACT inhaler Inhale 2 puffs into the lungs every 6 (six) hours as needed for wheezing or shortness of breath.  [name] atorvastatin (LIPITOR) 20 MG tablet Take 1 tablet (20 mg total) by mouth daily.  . benzocaine (ORAJEL) 10 % mucosal gel Use as directed in the mouth or throat 4 (four) times daily as needed for mouth pain.  . cholecalciferol (VITAMIN D3) 25 MCG (1000 UNIT) tablet Take 1,000 Units by mouth daily.  . clotrimazole (CLOTRIMAZOLE ANTI-FUNGAL) 1 % cream Apply 1 application topically 2 (two) times daily.  . cyclobenzaprine (FLEXERIL) 5 MG tablet Take 5 mg by mouth 2 (two) times daily as needed for muscle  spasms.   [name] diltiazem (CARDIZEM CD) 180 MG 24 hr capsule Take 1 capsule (180 mg total) by mouth daily.  . Dulaglutide (TRULICITY) 1.5 0000000 SOPN Inject 1.5 mg into the skin once a week.  . fluconazole (DIFLUCAN) 100 MG tablet Take 1 tablet (100 mg total) by mouth daily.  [name] gabapentin (NEURONTIN) 800 MG tablet Take 1 tablet (800 mg total) by mouth 3 (three) times daily.  . insulin degludec (TRESIBA FLEXTOUCH) 100 UNIT/ML FlexTouch Pen Inject 0.12 mLs (12 Units total) into the skin at bedtime.  [name] lactulose (CHRONULAC) 10 GM/15ML solution Take 45 mLs (30 g total) by mouth daily as needed for mild constipation or severe constipation.  [name] LORazepam (ATIVAN) 0.5 MG tablet Take 2 tablets (1 mg total) by mouth every 8 (eight) hours as needed for anxiety.  . metFORMIN (GLUCOPHAGE) 1000 MG tablet TAKE 1 TABLET TWICE DAILY WITH MEALS (Patient taking differently: Take 1,000 mg by mouth 2 (two) times daily. )  . pantoprazole (PROTONIX) 40 MG tablet TAKE 1 TABLET TWICE DAILY (Patient taking differently: Take 40 mg by mouth 2 (two) times daily. )  . predniSONE (DELTASONE) 10 MG tablet 3 tabs po daily day1,2; 2 tabs po daily day3,4; 1 tab po daily day5,6; 1/2 tab po daily day7,8  . QUEtiapine (SEROQUEL) 25 MG tablet Take 1 tablet (25 mg total) by mouth at bedtime as needed (insomnia).  . saccharomyces boulardii (FLORASTOR) 250 MG capsule Take 250 mg by mouth 2 (two) times daily.  . TRELEGY ELLIPTA 100-62.5-25 MCG/INH AEPB INHALE 1 PUFF INTO THE LUNGS DAILY.  [name] triamcinolone ointment (KENALOG) 0.1 % Apply 1 application topically 2 (two) times daily.  [name] venlafaxine XR (EFFEXOR-XR) 150 MG 24 hr capsule Take 1 capsule (150 mg total) by mouth daily.  . Glucerna (GLUCERNA) LIQD Take 237 mLs by mouth daily. (Patient not taking: Reported on 12/23/2019)   No facility-administered encounter medications on file as of 12/23/2019.    Diagnostic Tests Reviewed/Disposition:   EXAM (12/16/19): PORTABLE CHEST 1  VIEW  COMPARISON:  CT chest and chest x-ray dated Dec 11, 2019.  FINDINGS: The heart size and mediastinal contours are within normal limits. Normal pulmonary vascularity. Continued consolidation of the right lower lobe with layering small right pleural effusion. No pneumothorax. No acute osseous abnormality.  IMPRESSION: 1. Unchanged right lower lobe pneumonia with adjacent small pleural effusion.   Electronically Signed   By: Titus Dubin M.D.   On: 12/16/2019 09:16  Consults: ENT -- Dr. Richardson Landry for mouth sores  Discharge Instructions: Follow-up with PCP and Pulmonary  Disease/illness Education: Provided education today  Home Health/Community Services Discussions/Referrals: Ordered, but she declines at this time  Establishment or re-establishment of referral orders for community resources: none  Discussion with other health care providers: reviewed notes  Assessment and Support of treatment regimen adherence:  reviewed with patient today  Appointments Coordinated with: patient  Education for self-management, independent living, and ADLs: reviewed with patient today  DIABETES On Trulicity, taking 1.5 MG weekly. Continues on Metformin 1000 MG BID and Tresiba 20 units at HS. Working with CCM team and provider. Recent A1C in May 7.1%.  She also continues on monthly B12 shots for low B12 levels. Continues to have sore mouth , which was treated in hospital and she was seen by ENT, was given IV Diflucan in hospital and discharged with Diflucan and Prednisone. Hypoglycemic episodes: none Polydipsia/polyuria: no Visual disturbance: no Chest pain: no Paresthesias: no Glucose Monitoring: yes             Accucheck frequency: BID             Fasting glucose: 88 to 126 -- having some 160-170 since discharge (on Prednisone)             Post prandial: > 200             Evening:              Before meals: Taking Insulin?: yes             Long acting insulin: Tresiba 20  units daily             Short acting insulin: Blood Pressure Monitoring: just got cuff yesterday Retinal Examination: Up to Date Foot Exam: Up to Date Pneumovax: Up to Date Influenza: Up to Date Aspirin: yes   HYPERTENSION / HYPERLIPIDEMIA Continues on Atorvastatin 20 MG, Lisinopril 5 MG, Metoprolol 12.5 MG daily + ASA. Recent labs with LDL 36 and TCHOL 127. Satisfied with current treatment? yes Duration of hypertension: chronic BP monitoring frequency: a few times a week BP range:  120-130/60-70 BP medication side effects: no Duration of hyperlipidemia: chronic Cholesterol medication side effects: no Cholesterol supplements: none Medication compliance: good compliance Aspirin: yes Recent stressors: no Recurrent headaches: no Visual changes: no Palpitations: no Dyspnea: no Chest pain: no Lower extremity edema: no Dizzy/lightheaded: no   COPD Currently using Trelegy, was changed to this recently.  With this change has had to very seldom have to use Albuterol Has a history of smoking, quit 15 years ago.  Followed by [name] with pulmonary, sees him on 12/27/19. She does not use CPAP, has tried and it caused anxiety, uses O2 at home 2.5 L [place].  Recent sepsis with pneumonia in hospital, sent home with O2 due to failing walk test. COPD status: slightly uncontrolled at this time Satisfied with current treatment?: yes Oxygen use: no Dyspnea frequency: none Cough frequency: none Rescue inhaler frequency:  minimal Limitation of activity: no Productive cough: none Last Spirometry: unknown Pneumovax: Up to Date Influenza: Up to Date  CHRONIC PAIN: Followed by [place] in [place], last seen 11/07/2019. Continues to be followed by clinic nurse for pump check and replacement. She states her pain is well-controlled with current regimen.  Vitamin D on low side (17.5) in June, taking supplement with recent level improved at 31.7.  No recent falls or fractures.  Normal DEXA  2017.  DEPRESSION Continues on Effexor 150 MG daily. Mood status: controlled Satisfied with current treatment?: yes Symptom severity: mild  Duration of current treatment : chronic Side effects: no Medication compliance: good compliance Psychotherapy/counseling: has gone in the past Depressed mood: yes Anxious mood: no Anhedonia: no Significant weight loss or gain: no Insomnia: none Fatigue: no Feelings of worthlessness or guilt: no Impaired concentration/indecisiveness:  no Suicidal ideations: no Hopelessness: no Crying spells: no Depression screen [facility] 2/9 12/23/2019 11/15/2019 08/01/2019 07/19/2019 01/17/2019  Decreased Interest 0 0 0 0 0  Down, Depressed, Hopeless 0 3 1 0 0  PHQ - 2 Score 0 3 1 0 0  Altered sleeping 3 3 3 3 3   Tired, decreased energy 3 2 3 3 2   Change in appetite 0 0 0 0 0  Feeling bad or failure about yourself  0 0 0 0 0  Trouble concentrating 3 1 0 0 0  Moving slowly or fidgety/restless 0 0 0 0 0  Suicidal thoughts 0 0 0 0 0  PHQ-9 Score 9 9 7 6 5   Difficult doing work/chores Not difficult at all Somewhat difficult - Not difficult at all -  Some recent data might be hidden    Relevant past medical, surgical, family and social history reviewed and updated as indicated. Interim medical history since our last visit reviewed. Allergies and medications reviewed and updated.  Review of Systems  Constitutional: Negative for activity change, appetite change, diaphoresis, fatigue and fever.  Respiratory: Negative for cough, chest tightness, shortness of breath and wheezing.   Cardiovascular: Negative for chest pain, palpitations and leg swelling.  Gastrointestinal: Negative.   Endocrine: Negative for cold intolerance, heat intolerance, polydipsia, polyphagia and polyuria.  Neurological: Negative.   Psychiatric/Behavioral: Negative.     Per HPI unless specifically indicated above     Objective:    BP 134/82   Pulse 89   Temp 98 F (36.7 C) (Oral)    Wt 140 lb (63.5 kg)   LMP  (LMP Unknown)   SpO2 96%   BMI 29.26 kg/m   Wt Readings from Last 3 Encounters:  12/23/19 140 lb (63.5 kg)  12/13/19 145 lb 9.6 oz (66 kg)  12/07/19 142 lb (64.4 kg)    Physical Exam Vitals and nursing note reviewed.  Constitutional:      General: She is awake. She is not in acute distress.    Appearance: She is well-developed and well-groomed. She is not ill-appearing.  HENT:     Head: Normocephalic.     Right Ear: Hearing normal.     Left Ear: Hearing normal.     Mouth/Throat:     Mouth: Mucous membranes are dry.     Comments: Mild erythema to corners of mouth with skin intact.  Small blisters to anterior portion of tongue and one underneath corner of inside upper lip right side. Eyes:     General: Lids are normal.        Right eye: No discharge.        Left eye: No discharge.     Conjunctiva/sclera: Conjunctivae normal.     Pupils: Pupils are equal, round, and reactive to light.  Neck:     Thyroid: No thyromegaly.     Vascular: No carotid bruit.  Cardiovascular:     Rate and Rhythm: Normal rate and regular rhythm.     Heart sounds: Normal heart sounds. No murmur. No gallop.   Pulmonary:     Effort: Pulmonary effort is normal. No accessory muscle usage or respiratory distress.     Breath sounds: Examination of the right-lower field reveals rhonchi. Rhonchi present.     Comments: Scattered expiratory wheezes, clear with cough, throughout with rhonchi noted RLL.   Abdominal:     General: Bowel sounds are normal.     Palpations: Abdomen is soft.  Musculoskeletal:     Cervical back: Normal  range of motion and neck supple.     Right lower leg: No edema.     Left lower leg: No edema.  Lymphadenopathy:     Cervical: No cervical adenopathy.  Skin:    General: Skin is warm and dry.  Neurological:     Mental Status: She is alert and oriented to person, place, and time.  Psychiatric:        Attention and Perception: Attention normal.        Mood  and Affect: Mood normal.        Speech: Speech normal.        Behavior: Behavior normal. Behavior is cooperative.        Thought Content: Thought content normal.     Results for orders placed or performed during the hospital encounter of 12/11/19  Blood Culture (routine x 2)   Specimen: BLOOD  Result Value Ref Range   Specimen Description BLOOD RIGHT ANTECUBITAL    Special Requests      BOTTLES DRAWN AEROBIC AND ANAEROBIC Blood Culture results may not be optimal due to an inadequate volume of blood received in culture bottles   Culture      NO GROWTH 5 DAYS Performed at [facility], [address], [place], [place] [address]    Report Status 12/16/2019 FINAL   Blood Culture (routine x 2)   Specimen: BLOOD  Result Value Ref Range   Specimen Description BLOOD R UPPER ARM    Special Requests      BOTTLES DRAWN AEROBIC AND ANAEROBIC Blood Culture adequate volume   Culture      NO GROWTH 5 DAYS Performed at [facility], [address], [place], [place] [address]    Report Status 12/16/2019 FINAL   Urine culture   Specimen: In/Out Cath Urine  Result Value Ref Range   Specimen Description      IN/OUT CATH URINE Performed at [facility], [address] [place], [place] [address]    Special Requests      NONE Performed at [facility], [address] [place], [place] [address]    Culture (A)     2,000 COLONIES/mL STAPHYLOCOCCUS HAEMOLYTICUS 2,000 COLONIES/mL ENTEROCOCCUS FAECALIS 10,000 COLONIES/mL GROUP B STREP(S.AGALACTIAE)ISOLATED TESTING AGAINST S. AGALACTIAE NOT ROUTINELY PERFORMED DUE TO PREDICTABILITY OF AMP/PEN/VAN SUSCEPTIBILITY. Performed at [facility], [place] [address] [place], [place] [address]    Report Status 12/14/2019 FINAL    Organism ID, Bacteria STAPHYLOCOCCUS HAEMOLYTICUS (A)    Organism ID, Bacteria ENTEROCOCCUS FAECALIS (A)       Susceptibility   Enterococcus faecalis - MIC*    AMPICILLIN <=2  SENSITIVE Sensitive     NITROFURANTOIN <=16 SENSITIVE Sensitive     VANCOMYCIN 1 SENSITIVE Sensitive     * 2,000 COLONIES/mL ENTEROCOCCUS FAECALIS   Staphylococcus haemolyticus - MIC*    CIPROFLOXACIN <=0.5 SENSITIVE Sensitive     GENTAMICIN <=0.5 SENSITIVE Sensitive     NITROFURANTOIN <=16 SENSITIVE Sensitive     OXACILLIN <=0.25 SENSITIVE Sensitive     TETRACYCLINE <=1 SENSITIVE Sensitive     VANCOMYCIN <=0.5 SENSITIVE Sensitive     TRIMETH/SULFA <=10 SENSITIVE Sensitive     CLINDAMYCIN >=8 RESISTANT Resistant     RIFAMPIN <=0.5 SENSITIVE Sensitive     Inducible Clindamycin NEGATIVE Sensitive     * 2,000 COLONIES/mL STAPHYLOCOCCUS HAEMOLYTICUS  Respiratory Panel by RT PCR (Flu A&B, Covid) - Nasopharyngeal Swab   Specimen: Nasopharyngeal Swab  Result Value Ref Range  SARS Coronavirus 2 by RT PCR NEGATIVE NEGATIVE   Influenza A by PCR NEGATIVE NEGATIVE   Influenza B by PCR NEGATIVE NEGATIVE  MRSA PCR Screening   Specimen: Nasopharyngeal  Result Value Ref Range   MRSA by PCR NEGATIVE NEGATIVE  Aerobic/Anaerobic Culture (surgical/deep wound)   Specimen: Throat  Result Value Ref Range   Specimen Description      THROAT Performed at [facility], [address] [place], [place] [address]    Special Requests      Immunocompromised Performed at [facility], [address] [place], [place] [address]    Gram Stain NO WBC SEEN NO ORGANISMS SEEN     Culture      RARE LACTOBACILLUS SPECIES Standardized susceptibility testing for this organism is not available. NO ANAEROBES ISOLATED Performed at [facility], [place] [address], [place], [place] [address]    Report Status 12/23/2019 FINAL   Basic metabolic panel  Result Value Ref Range   Sodium 129 (L) 135 - 145 mmol/L   Potassium 4.5 3.5 - 5.1 mmol/L   Chloride 95 (L) 98 - 111 mmol/L   CO2 20 (L) 22 - 32 mmol/L   Glucose, Bld 121 (H) 70 - 99 mg/dL   BUN 29 (H) 8 - 23 mg/dL   Creatinine, Ser 1.04  (H) 0.44 - 1.00 mg/dL   Calcium 8.3 (L) 8.9 - 10.3 mg/dL   GFR calc non Af Amer 55 (L) >60 mL/min   GFR calc Af Amer >60 >60 mL/min   Anion gap 14 5 - 15  CBC  Result Value Ref Range   WBC 34.2 (H) 4.0 - 10.5 K/uL   RBC 4.38 3.87 - 5.11 MIL/uL   Hemoglobin 11.7 (L) 12.0 - 15.0 g/dL   HCT 36.2 36.0 - 46.0 %   MCV 82.6 80.0 - 100.0 fL   MCH 26.7 26.0 - 34.0 pg   MCHC 32.3 30.0 - 36.0 g/dL   RDW 16.5 (H) 11.5 - 15.5 %   Platelets 419 (H) 150 - 400 K/uL   nRBC 0.0 0.0 - 0.2 %  Lactic acid, plasma  Result Value Ref Range   Lactic Acid, Venous 3.4 (HH) 0.5 - 1.9 mmol/L  Lactic acid, plasma  Result Value Ref Range   Lactic Acid, Venous 3.2 (HH) 0.5 - 1.9 mmol/L  APTT  Result Value Ref Range   aPTT 37 (H) 24 - 36 seconds  Protime-INR  Result Value Ref Range   Prothrombin Time 14.8 11.4 - 15.2 seconds   INR 1.2 0.8 - 1.2  Urinalysis, Routine w reflex microscopic  Result Value Ref Range   Color, Urine STRAW (A) YELLOW   APPearance CLEAR (A) CLEAR   Specific Gravity, Urine 1.021 1.005 - 1.030   pH 5.0 5.0 - 8.0   Glucose, UA NEGATIVE NEGATIVE mg/dL   Hgb urine dipstick NEGATIVE NEGATIVE   Bilirubin Urine NEGATIVE NEGATIVE   Ketones, ur NEGATIVE NEGATIVE mg/dL   Protein, ur NEGATIVE NEGATIVE mg/dL   Nitrite NEGATIVE NEGATIVE   Leukocytes,Ua MODERATE (A) NEGATIVE   RBC / HPF 0-5 0 - 5 RBC/hpf   WBC, UA 21-50 0 - 5 WBC/hpf   Bacteria, UA NONE SEEN NONE SEEN   Squamous Epithelial / LPF 0-5 0 - 5   Mucus PRESENT   Lactic acid, plasma  Result Value Ref Range   Lactic Acid, Venous 4.5 (HH) 0.5 - 1.9 mmol/L  Lactic acid, plasma  Result Value Ref Range   Lactic  Acid, Venous 2.9 (HH) 0.5 - 1.9 mmol/L  Brain natriuretic peptide  Result Value Ref Range   B Natriuretic Peptide 546.0 (H) 0.0 - 100.0 pg/mL  Magnesium  Result Value Ref Range   Magnesium 1.4 (L) 1.7 - 2.4 mg/dL  Fibrin derivatives D-Dimer ([facility] only)  Result Value Ref Range   Fibrin derivatives D-dimer ([facility])  2,868.65 (H) 0.00 - 499.00 ng/mL (FEU)  Hemoglobin A1c  Result Value Ref Range   Hgb A1c MFr Bld 7.1 (H) 4.8 - 5.6 %   Mean Plasma Glucose 157.07 mg/dL  HIV Antibody (routine testing w rflx)  Result Value Ref Range   HIV Screen 4th Generation wRfx Non Reactive Non Reactive  Strep pneumoniae urinary antigen  Result Value Ref Range   Strep Pneumo Urinary Antigen NEGATIVE NEGATIVE  CBC with Differential/Platelet  Result Value Ref Range   WBC 27.3 (H) 4.0 - 10.5 K/uL   RBC 4.01 3.87 - 5.11 MIL/uL   Hemoglobin 10.7 (L) 12.0 - 15.0 g/dL   HCT 33.5 (L) 36.0 - 46.0 %   MCV 83.5 80.0 - 100.0 fL   MCH 26.7 26.0 - 34.0 pg   MCHC 31.9 30.0 - 36.0 g/dL   RDW 16.3 (H) 11.5 - 15.5 %   Platelets 459 (H) 150 - 400 K/uL   nRBC 0.0 0.0 - 0.2 %   Neutrophils Relative % 82 %   Neutro Abs 22.8 (H) 1.7 - 7.7 K/uL   Lymphocytes Relative 5 %   Lymphs Abs 1.4 0.7 - 4.0 K/uL   Monocytes Relative 2 %   Monocytes Absolute 0.4 0.1 - 1.0 K/uL   Eosinophils Relative 1 %   Eosinophils Absolute 0.2 0.0 - 0.5 K/uL   Basophils Relative 1 %   Basophils Absolute 0.2 (H) 0.0 - 0.1 K/uL   Immature Granulocytes 9 %   Abs Immature Granulocytes 2.39 (H) 0.00 - 0.07 K/uL  Comprehensive metabolic panel  Result Value Ref Range   Sodium 133 (L) 135 - 145 mmol/L   Potassium 3.3 (L) 3.5 - 5.1 mmol/L   Chloride 102 98 - 111 mmol/L   CO2 24 22 - 32 mmol/L   Glucose, Bld 175 (H) 70 - 99 mg/dL   BUN 19 8 - 23 mg/dL   Creatinine, Ser 0.62 0.44 - 1.00 mg/dL   Calcium 8.1 (L) 8.9 - 10.3 mg/dL   Total Protein 6.8 6.5 - 8.1 g/dL   Albumin 2.6 (L) 3.5 - 5.0 g/dL   AST 17 15 - 41 U/L   ALT 11 0 - 44 U/L   Alkaline Phosphatase 69 38 - 126 U/L   Total Bilirubin 0.7 0.3 - 1.2 mg/dL   GFR calc non Af Amer >60 >60 mL/min   GFR calc Af Amer >60 >60 mL/min   Anion gap 7 5 - 15  Procalcitonin - Baseline  Result Value Ref Range   Procalcitonin 24.87 ng/mL  Glucose, capillary  Result Value Ref Range   Glucose-Capillary 148 (H)  70 - 99 mg/dL  Glucose, capillary  Result Value Ref Range   Glucose-Capillary 149 (H) 70 - 99 mg/dL  Glucose, capillary  Result Value Ref Range   Glucose-Capillary 151 (H) 70 - 99 mg/dL  CBC with Differential/Platelet  Result Value Ref Range   WBC 22.5 (H) 4.0 - 10.5 K/uL   RBC 3.41 (L) 3.87 - 5.11 MIL/uL   Hemoglobin 9.0 (L) 12.0 - 15.0 g/dL   HCT 27.7 (L) 36.0 - 46.0 %   MCV 81.2 80.0 -  100.0 fL   MCH 26.4 26.0 - 34.0 pg   MCHC 32.5 30.0 - 36.0 g/dL   RDW 16.3 (H) 11.5 - 15.5 %   Platelets 398 150 - 400 K/uL   nRBC 0.0 0.0 - 0.2 %   Neutrophils Relative % 87 %   Neutro Abs 19.8 (H) 1.7 - 7.7 K/uL   Lymphocytes Relative 6 %   Lymphs Abs 1.4 0.7 - 4.0 K/uL   Monocytes Relative 4 %   Monocytes Absolute 0.9 0.1 - 1.0 K/uL   Eosinophils Relative 1 %   Eosinophils Absolute 0.1 0.0 - 0.5 K/uL   Basophils Relative 1 %   Basophils Absolute 0.1 0.0 - 0.1 K/uL   WBC Morphology MORPHOLOGY UNREMARKABLE    RBC Morphology MORPHOLOGY UNREMARKABLE    Smear Review Normal platelet morphology    Immature Granulocytes 1 %   Abs Immature Granulocytes 0.20 (H) 0.00 - 0.07 K/uL  Basic metabolic panel  Result Value Ref Range   Sodium 138 135 - 145 mmol/L   Potassium 3.5 3.5 - 5.1 mmol/L   Chloride 110 98 - 111 mmol/L   CO2 22 22 - 32 mmol/L   Glucose, Bld 116 (H) 70 - 99 mg/dL   BUN 21 8 - 23 mg/dL   Creatinine, Ser 0.53 0.44 - 1.00 mg/dL   Calcium 8.1 (L) 8.9 - 10.3 mg/dL   GFR calc non Af Amer >60 >60 mL/min   GFR calc Af Amer >60 >60 mL/min   Anion gap 6 5 - 15  Glucose, capillary  Result Value Ref Range   Glucose-Capillary 239 (H) 70 - 99 mg/dL   Comment 1 Notify RN    Comment 2 Document in Chart   Glucose, capillary  Result Value Ref Range   Glucose-Capillary 112 (H) 70 - 99 mg/dL  Glucose, capillary  Result Value Ref Range   Glucose-Capillary 197 (H) 70 - 99 mg/dL  Glucose, capillary  Result Value Ref Range   Glucose-Capillary 193 (H) 70 - 99 mg/dL  CBC with  Differential/Platelet  Result Value Ref Range   WBC 19.1 (H) 4.0 - 10.5 K/uL   RBC 3.02 (L) 3.87 - 5.11 MIL/uL   Hemoglobin 8.0 (L) 12.0 - 15.0 g/dL   HCT 25.7 (L) 36.0 - 46.0 %   MCV 85.1 80.0 - 100.0 fL   MCH 26.5 26.0 - 34.0 pg   MCHC 31.1 30.0 - 36.0 g/dL   RDW 17.0 (H) 11.5 - 15.5 %   Platelets 370 150 - 400 K/uL   nRBC 0.1 0.0 - 0.2 %   Neutrophils Relative % 85 %   Neutro Abs 16.2 (H) 1.7 - 7.7 K/uL   Lymphocytes Relative 6 %   Lymphs Abs 1.2 0.7 - 4.0 K/uL   Monocytes Relative 7 %   Monocytes Absolute 1.3 (H) 0.1 - 1.0 K/uL   Eosinophils Relative 0 %   Eosinophils Absolute 0.0 0.0 - 0.5 K/uL   Basophils Relative 1 %   Basophils Absolute 0.1 0.0 - 0.1 K/uL   WBC Morphology MORPHOLOGY UNREMARKABLE    Smear Review Normal platelet morphology    Immature Granulocytes 1 %   Abs Immature Granulocytes 0.26 (H) 0.00 - 0.07 K/uL   Burr Cells PRESENT   Basic metabolic panel  Result Value Ref Range   Sodium 137 135 - 145 mmol/L   Potassium 3.8 3.5 - 5.1 mmol/L   Chloride 112 (H) 98 - 111 mmol/L   CO2 21 (L) 22 - 32 mmol/L  Glucose, Bld 165 (H) 70 - 99 mg/dL   BUN 15 8 - 23 mg/dL   Creatinine, Ser 0.60 0.44 - 1.00 mg/dL   Calcium 8.0 (L) 8.9 - 10.3 mg/dL   GFR calc non Af Amer >60 >60 mL/min   GFR calc Af Amer >60 >60 mL/min   Anion gap 4 (L) 5 - 15  Magnesium  Result Value Ref Range   Magnesium 1.7 1.7 - 2.4 mg/dL  Glucose, capillary  Result Value Ref Range   Glucose-Capillary 239 (H) 70 - 99 mg/dL  Glucose, capillary  Result Value Ref Range   Glucose-Capillary 134 (H) 70 - 99 mg/dL  Glucose, capillary  Result Value Ref Range   Glucose-Capillary 131 (H) 70 - 99 mg/dL  Glucose, capillary  Result Value Ref Range   Glucose-Capillary 127 (H) 70 - 99 mg/dL  CBC with Differential/Platelet  Result Value Ref Range   WBC 15.9 (H) 4.0 - 10.5 K/uL   RBC 3.72 (L) 3.87 - 5.11 MIL/uL   Hemoglobin 9.7 (L) 12.0 - 15.0 g/dL   HCT 30.6 (L) 36.0 - 46.0 %   MCV 82.3 80.0 - 100.0  fL   MCH 26.1 26.0 - 34.0 pg   MCHC 31.7 30.0 - 36.0 g/dL   RDW 17.2 (H) 11.5 - 15.5 %   Platelets 375 150 - 400 K/uL   nRBC 0.1 0.0 - 0.2 %   Neutrophils Relative % 67 %   Neutro Abs 10.7 (H) 1.7 - 7.7 K/uL   Lymphocytes Relative 14 %   Lymphs Abs 2.3 0.7 - 4.0 K/uL   Monocytes Relative 12 %   Monocytes Absolute 1.9 (H) 0.1 - 1.0 K/uL   Eosinophils Relative 4 %   Eosinophils Absolute 0.7 (H) 0.0 - 0.5 K/uL   Basophils Relative 1 %   Basophils Absolute 0.1 0.0 - 0.1 K/uL   Immature Granulocytes 2 %   Abs Immature Granulocytes 0.30 (H) 0.00 - 0.07 K/uL  Basic metabolic panel  Result Value Ref Range   Sodium 138 135 - 145 mmol/L   Potassium 3.1 (L) 3.5 - 5.1 mmol/L   Chloride 108 98 - 111 mmol/L   CO2 23 22 - 32 mmol/L   Glucose, Bld 89 70 - 99 mg/dL   BUN 10 8 - 23 mg/dL   Creatinine, Ser 0.61 0.44 - 1.00 mg/dL   Calcium 8.2 (L) 8.9 - 10.3 mg/dL   GFR calc non Af Amer >60 >60 mL/min   GFR calc Af Amer >60 >60 mL/min   Anion gap 7 5 - 15  Glucose, capillary  Result Value Ref Range   Glucose-Capillary 152 (H) 70 - 99 mg/dL   Comment 1 Notify RN   Glucose, capillary  Result Value Ref Range   Glucose-Capillary 56 (L) 70 - 99 mg/dL  Glucose, capillary  Result Value Ref Range   Glucose-Capillary 71 70 - 99 mg/dL  Glucose, capillary  Result Value Ref Range   Glucose-Capillary 150 (H) 70 - 99 mg/dL  Glucose, capillary  Result Value Ref Range   Glucose-Capillary 103 (H) 70 - 99 mg/dL  Basic metabolic panel  Result Value Ref Range   Sodium 138 135 - 145 mmol/L   Potassium 3.1 (L) 3.5 - 5.1 mmol/L   Chloride 102 98 - 111 mmol/L   CO2 29 22 - 32 mmol/L   Glucose, Bld 113 (H) 70 - 99 mg/dL   BUN 11 8 - 23 mg/dL   Creatinine, Ser 0.52 0.44 - 1.00 mg/dL  Calcium 8.1 (L) 8.9 - 10.3 mg/dL   GFR calc non Af Amer >60 >60 mL/min   GFR calc Af Amer >60 >60 mL/min   Anion gap 7 5 - 15  Glucose, capillary  Result Value Ref Range   Glucose-Capillary 167 (H) 70 - 99 mg/dL   Glucose, capillary  Result Value Ref Range   Glucose-Capillary 95 70 - 99 mg/dL  Glucose, capillary  Result Value Ref Range   Glucose-Capillary 108 (H) 70 - 99 mg/dL  Glucose, capillary  Result Value Ref Range   Glucose-Capillary 237 (H) 70 - 99 mg/dL   Comment 1 Notify RN   Glucose, capillary  Result Value Ref Range   Glucose-Capillary 151 (H) 70 - 99 mg/dL  Basic metabolic panel  Result Value Ref Range   Sodium 138 135 - 145 mmol/L   Potassium 3.7 3.5 - 5.1 mmol/L   Chloride 99 98 - 111 mmol/L   CO2 31 22 - 32 mmol/L   Glucose, Bld 142 (H) 70 - 99 mg/dL   BUN 16 8 - 23 mg/dL   Creatinine, Ser 0.64 0.44 - 1.00 mg/dL   Calcium 8.1 (L) 8.9 - 10.3 mg/dL   GFR calc non Af Amer >60 >60 mL/min   GFR calc Af Amer >60 >60 mL/min   Anion gap 8 5 - 15  Magnesium  Result Value Ref Range   Magnesium 1.3 (L) 1.7 - 2.4 mg/dL  CBC  Result Value Ref Range   WBC 11.7 (H) 4.0 - 10.5 K/uL   RBC 3.25 (L) 3.87 - 5.11 MIL/uL   Hemoglobin 8.5 (L) 12.0 - 15.0 g/dL   HCT 26.9 (L) 36.0 - 46.0 %   MCV 82.8 80.0 - 100.0 fL   MCH 26.2 26.0 - 34.0 pg   MCHC 31.6 30.0 - 36.0 g/dL   RDW 16.7 (H) 11.5 - 15.5 %   Platelets 457 (H) 150 - 400 K/uL   nRBC 0.2 0.0 - 0.2 %  Glucose, capillary  Result Value Ref Range   Glucose-Capillary 145 (H) 70 - 99 mg/dL  Glucose, capillary  Result Value Ref Range   Glucose-Capillary 134 (H) 70 - 99 mg/dL   Comment 1 Notify RN   Glucose, capillary  Result Value Ref Range   Glucose-Capillary 146 (H) 70 - 99 mg/dL  Occult blood card to lab, stool RN will collect  Result Value Ref Range   Fecal Occult Bld NEGATIVE NEGATIVE  Glucose, capillary  Result Value Ref Range   Glucose-Capillary 159 (H) 70 - 99 mg/dL   Comment 1 Notify RN   Ferritin  Result Value Ref Range   Ferritin 73 11 - 307 ng/mL  Retic Panel  Result Value Ref Range   Retic Ct Pct 1.2 0.4 - 3.1 %   RBC. 2.91 (L) 3.87 - 5.11 MIL/uL   Retic Count, Absolute 34.0 19.0 - 186.0 K/uL   Immature  Retic Fract 40.7 (H) 2.3 - 15.9 %   Reticulocyte Hemoglobin 29.9 >27.9 pg  Lactate dehydrogenase  Result Value Ref Range   LDH 122 98 - 192 U/L  CBC  Result Value Ref Range   WBC 9.9 4.0 - 10.5 K/uL   RBC 2.90 (L) 3.87 - 5.11 MIL/uL   Hemoglobin 7.6 (L) 12.0 - 15.0 g/dL   HCT 24.4 (L) 36.0 - 46.0 %   MCV 84.1 80.0 - 100.0 fL   MCH 26.2 26.0 - 34.0 pg   MCHC 31.1 30.0 - 36.0 g/dL   RDW 16.6 (  H) 11.5 - 15.5 %   Platelets 438 (H) 150 - 400 K/uL   nRBC 0.0 0.0 - 0.2 %  Basic metabolic panel  Result Value Ref Range   Sodium 139 135 - 145 mmol/L   Potassium 3.7 3.5 - 5.1 mmol/L   Chloride 100 98 - 111 mmol/L   CO2 31 22 - 32 mmol/L   Glucose, Bld 139 (H) 70 - 99 mg/dL   BUN 13 8 - 23 mg/dL   Creatinine, Ser 0.54 0.44 - 1.00 mg/dL   Calcium 8.0 (L) 8.9 - 10.3 mg/dL   GFR calc non Af Amer >60 >60 mL/min   GFR calc Af Amer >60 >60 mL/min   Anion gap 8 5 - 15  Glucose, capillary  Result Value Ref Range   Glucose-Capillary 134 (H) 70 - 99 mg/dL  Glucose, capillary  Result Value Ref Range   Glucose-Capillary 88 70 - 99 mg/dL  Glucose, capillary  Result Value Ref Range   Glucose-Capillary 137 (H) 70 - 99 mg/dL  Glucose, capillary  Result Value Ref Range   Glucose-Capillary 179 (H) 70 - 99 mg/dL  CBC  Result Value Ref Range   WBC 10.5 4.0 - 10.5 K/uL   RBC 3.20 (L) 3.87 - 5.11 MIL/uL   Hemoglobin 8.3 (L) 12.0 - 15.0 g/dL   HCT 27.5 (L) 36.0 - 46.0 %   MCV 85.9 80.0 - 100.0 fL   MCH 25.9 (L) 26.0 - 34.0 pg   MCHC 30.2 30.0 - 36.0 g/dL   RDW 16.5 (H) 11.5 - 15.5 %   Platelets 460 (H) 150 - 400 K/uL   nRBC 0.0 0.0 - 0.2 %  Magnesium  Result Value Ref Range   Magnesium 2.0 1.7 - 2.4 mg/dL  Basic metabolic panel  Result Value Ref Range   Sodium 139 135 - 145 mmol/L   Potassium 3.9 3.5 - 5.1 mmol/L   Chloride 99 98 - 111 mmol/L   CO2 32 22 - 32 mmol/L   Glucose, Bld 146 (H) 70 - 99 mg/dL   BUN 10 8 - 23 mg/dL   Creatinine, Ser 0.64 0.44 - 1.00 mg/dL   Calcium 8.3 (L) 8.9  - 10.3 mg/dL   GFR calc non Af Amer >60 >60 mL/min   GFR calc Af Amer >60 >60 mL/min   Anion gap 8 5 - 15  Haptoglobin  Result Value Ref Range   Haptoglobin 304 37 - 355 mg/dL  Glucose, capillary  Result Value Ref Range   Glucose-Capillary 300 (H) 70 - 99 mg/dL  Glucose, capillary  Result Value Ref Range   Glucose-Capillary 162 (H) 70 - 99 mg/dL  Glucose, capillary  Result Value Ref Range   Glucose-Capillary 171 (H) 70 - 99 mg/dL  Glucose, capillary  Result Value Ref Range   Glucose-Capillary 262 (H) 70 - 99 mg/dL  Glucose, capillary  Result Value Ref Range   Glucose-Capillary 258 (H) 70 - 99 mg/dL  Glucose, capillary  Result Value Ref Range   Glucose-Capillary 175 (H) 70 - 99 mg/dL  Glucose, capillary  Result Value Ref Range   Glucose-Capillary 227 (H) 70 - 99 mg/dL  Glucose, capillary  Result Value Ref Range   Glucose-Capillary 127 (H) 70 - 99 mg/dL  Glucose, capillary  Result Value Ref Range   Glucose-Capillary 119 (H) 70 - 99 mg/dL  Type and screen [facility]  Result Value Ref Range   ABO/RH(D) O NEG    Antibody Screen NEG  Sample Expiration      12/22/2019,2359 Performed at [facility], [place], [place] [address]   Troponin I (High Sensitivity)  Result Value Ref Range   Troponin I (High Sensitivity) 10 <18 ng/L  Troponin I (High Sensitivity)  Result Value Ref Range   Troponin I (High Sensitivity) 17 <18 ng/L      Assessment & Plan:   Problem List Items Addressed This Visit      Cardiovascular and Mediastinum   Hypertension associated with diabetes ([place])    Chronic, stable with BP below goal on visit today and on home readings.  Recommend she continue to monitor BP at home regularly.  Continue current medication regimen and adjust as needed.  BMP today.         Respiratory   Sleep apnea    Poor tolerance of CPAP mask, uses O2 night 2.5 L [place].      COPD, moderate (HCC)    Chronic, ongoing.   Continue Trelegy which offers benefit of medication minimization and has improved symptoms overall.  Continue to collaborate with [name] and review notes.        Community acquired pneumonia - Primary    Acute and improving.  Continue current regimen as prescribed at discharge and collaboration with pulmonary -- will plan on repeat CXR at 6 week mark on June 25th.  Recommend frequent deep breathing exercises at home and moving in home.  She refuses PT.  CBC and BMP today.      Relevant Orders   Basic metabolic panel   CBC with Differential/Platelet   Acute on chronic respiratory failure with hypoxia (HCC)    Recent hospitalization for PNA with underlying COPD.  Continue current inhaler regimen and collaboration with [name].  Continue O2 until cleared by pulmonary.  Review notes once available.        Digestive   Aphthous ulcer of tongue    Ongoing, continue current Prednisone and Diflucan as prescribed at discharge from hospital.  If ongoing refer to ENT -- she saw Dr. Richardson Landry in hospital.        Endocrine   Hyperlipidemia associated with type 2 diabetes mellitus ([place])    Chronic, stable.  Continue current medication regimen and adjust as needed.  Lipid panel next visit.      Type 2 diabetes mellitus with diabetic neuropathy, unspecified (HCC)    Chronic, ongoing, insulin dependent. A1C 7.1% in May in hospital, now BS trending upwards at home due to Prednisone.  Reassured patient.  Will continue Trulicity, Tresiba, and Metformin at this time.  Recommend she continue to monitor BS consistently at home and document + focus heavily on diet changes.  Could consider increase in Trulicity next visit if elevation noted.  Return in 3 months.         Other   Obesity    Recommended eating smaller high protein, low fat meals more frequently and exercising 30 mins a day 5 times a week with a goal of 10-15lb weight loss in the next 3 months. Patient voiced their understanding and  motivation to adhere to these recommendations.       Depression, major, single episode, in partial remission (HCC)    Chronic, stable.  Denies SI/HI.  Mood well-controlled.  Continue current medication regimen and adjust as needed.  Refills as needed.      Chronic back pain    Continue to collaborate with chronic pain clinic in [facility].  Has morphine pump.  Follow up plan: Return in about 4 weeks (around 01/20/2020) for Pneumonia.

## 2019-12-23 NOTE — Telephone Encounter (Signed)
Copied from [place] [id number]. Topic: Referral - Status >> Dec 23, 2019  9:27 AM Scherrie Gerlach wrote: Reason for CRM: Amy with [facility] states she reached out to the pt this am to set up eval for home health PT.  Amy  states pt sounded overwhelmed and emotional.  Stating she said she just does not know how much more she can handle.  Pt has appt at 10 am today with Jolene.  Amy advised the pt to talk with Jolene about this, and if Jolene thinks this a priority, Amy can call back and scheduled something for next week.  But she did ask the pt to discuss any concerns with Jolene at her appt today. Cb  [phone]

## 2019-12-23 NOTE — Telephone Encounter (Signed)
Spoke to patient today and she really is not interested in PT and does not want people in her home, although I did recommend it.  She will think about this and contact them after seeing [name] if she decides she needs it.

## 2019-12-23 NOTE — Assessment & Plan Note (Signed)
Chronic, ongoing.  Continue Trelegy which offers benefit of medication minimization and has improved symptoms overall.  Continue to collaborate with Dr. Fleming and review notes.   

## 2019-12-23 NOTE — Assessment & Plan Note (Addendum)
Acute and improving.  Continue current regimen as prescribed at discharge and collaboration with pulmonary -- will plan on repeat CXR at 6 week mark on June 25th.  Recommend frequent deep breathing exercises at home and moving in home.  She refuses PT.  CBC and BMP today.

## 2019-12-23 NOTE — Assessment & Plan Note (Signed)
Chronic, stable with BP below goal on visit today and on home readings.  Recommend she continue to monitor BP at home regularly.  Continue current medication regimen and adjust as needed.  BMP today.

## 2019-12-23 NOTE — Assessment & Plan Note (Signed)
Chronic, stable.  Continue current medication regimen and adjust as needed.  Lipid panel next visit.

## 2019-12-23 NOTE — Assessment & Plan Note (Signed)
Recent hospitalization for PNA with underlying COPD.  Continue current inhaler regimen and collaboration with [name].  Continue O2 until cleared by pulmonary.  Review notes once available.

## 2019-12-23 NOTE — Assessment & Plan Note (Signed)
Chronic, stable.  Denies SI/HI.  Mood well-controlled.  Continue current medication regimen and adjust as needed.  Refills as needed.

## 2019-12-24 LAB — BASIC METABOLIC PANEL
BUN/Creatinine Ratio: 11 — ABNORMAL LOW (ref 12–28)
BUN: 7 mg/dL — ABNORMAL LOW (ref 8–27)
CO2: 30 mmol/L — ABNORMAL HIGH (ref 20–29)
Calcium: 9.1 mg/dL (ref 8.7–10.3)
Chloride: 98 mmol/L (ref 96–106)
Creatinine, Ser: 0.64 mg/dL (ref 0.57–1.00)
GFR calc Af Amer: 105 mL/min/{1.73_m2} (ref >59)
GFR calc non Af Amer: 91 mL/min/{1.73_m2} (ref >59)
Glucose: 56 mg/dL — ABNORMAL LOW (ref 65–99)
Potassium: 4.2 mmol/L (ref 3.5–5.2)
Sodium: 142 mmol/L (ref 134–144)

## 2019-12-24 LAB — CBC WITH DIFFERENTIAL/PLATELET
Basophils Absolute: 0.1 x10E3/uL (ref 0.0–0.2)
Basos: 1 %
EOS (ABSOLUTE): 0.2 x10E3/uL (ref 0.0–0.4)
Eos: 1 %
Hematocrit: 30.3 % — ABNORMAL LOW (ref 34.0–46.6)
Hemoglobin: 9.5 g/dL — ABNORMAL LOW (ref 11.1–15.9)
Immature Grans (Abs): 0.2 x10E3/uL — ABNORMAL HIGH (ref 0.0–0.1)
Immature Granulocytes: 1 %
Lymphocytes Absolute: 3.9 x10E3/uL — ABNORMAL HIGH (ref 0.7–3.1)
Lymphs: 23 %
MCH: 26.9 pg (ref 26.6–33.0)
MCHC: 31.4 g/dL — ABNORMAL LOW (ref 31.5–35.7)
MCV: 86 fL (ref 79–97)
Monocytes Absolute: 1.4 x10E3/uL — ABNORMAL HIGH (ref 0.1–0.9)
Monocytes: 8 %
Neutrophils Absolute: 11.1 x10E3/uL — ABNORMAL HIGH (ref 1.4–7.0)
Neutrophils: 66 %
Platelets: 640 x10E3/uL — ABNORMAL HIGH (ref 150–450)
RBC: 3.53 x10E6/uL — ABNORMAL LOW (ref 3.77–5.28)
RDW: 16.1 % — ABNORMAL HIGH (ref 11.7–15.4)
WBC: 16.9 x10E3/uL — ABNORMAL HIGH (ref 3.4–10.8)

## 2019-12-26 ENCOUNTER — Telehealth: Payer: Self-pay | Admitting: Nurse Practitioner

## 2019-12-26 DIAGNOSIS — J189 Pneumonia, unspecified organism: Secondary | ICD-10-CM

## 2019-12-26 NOTE — Progress Notes (Signed)
Refer to telephone note dated 12/26/2019

## 2019-12-26 NOTE — Telephone Encounter (Signed)
Spoke to patient on phone and reviewed recent lab results.  Kidney function remains stable.  WBC elevated and neutrophils, currently on Prednisone and recovering from PNA.  Also continues to show anemia, although some improvement.  Would like to recheck this later this week, which she agrees with.  Sees [name] tomorrow.  Have recommended to her if any worsening cough, SOB, CP, or back pain to immediately report to ER for evaluation.  She agrees with this plan.

## 2019-12-27 ENCOUNTER — Ambulatory Visit
Admission: RE | Admit: 2019-12-27 | Discharge: 2019-12-27 | Disposition: A | Discharge status: Home / Self Care | Payer: Medicare HMO | Source: Ambulatory Visit | Attending: Specialist | Admitting: Specialist

## 2019-12-27 ENCOUNTER — Other Ambulatory Visit: Payer: Self-pay

## 2019-12-27 ENCOUNTER — Other Ambulatory Visit: Payer: Self-pay | Admitting: Specialist

## 2019-12-27 ENCOUNTER — Other Ambulatory Visit
Admission: RE | Admit: 2019-12-27 | Discharge: 2019-12-27 | Disposition: A | Discharge status: Home / Self Care | Payer: Medicare HMO | Source: Ambulatory Visit | Attending: Specialist | Admitting: Specialist

## 2019-12-27 DIAGNOSIS — I2694 Multiple subsegmental pulmonary emboli without acute cor pulmonale: Secondary | ICD-10-CM | POA: Diagnosis not present

## 2019-12-27 DIAGNOSIS — R079 Chest pain, unspecified: Secondary | ICD-10-CM | POA: Diagnosis present

## 2019-12-27 DIAGNOSIS — R0600 Dyspnea, unspecified: Secondary | ICD-10-CM | POA: Diagnosis not present

## 2019-12-27 DIAGNOSIS — R7989 Other specified abnormal findings of blood chemistry: Secondary | ICD-10-CM | POA: Insufficient documentation

## 2019-12-27 DIAGNOSIS — I2699 Other pulmonary embolism without acute cor pulmonale: Secondary | ICD-10-CM | POA: Diagnosis not present

## 2019-12-27 DIAGNOSIS — R0789 Other chest pain: Secondary | ICD-10-CM | POA: Insufficient documentation

## 2019-12-27 LAB — FIBRIN DERIVATIVES D-DIMER (ARMC ONLY): Fibrin derivatives D-dimer (ARMC): 2104.83 ng/mL (FEU) — ABNORMAL HIGH (ref 0.00–499.00)

## 2019-12-27 MED ORDER — IOHEXOL 350 MG/ML SOLN
75.0000 mL | Freq: Once | INTRAVENOUS | Status: AC | PRN
  Administered 2019-12-27: 75 mL via INTRAVENOUS

## 2019-12-27 NOTE — Telephone Encounter (Signed)
Lvm to make this lab apt.  °

## 2019-12-28 ENCOUNTER — Encounter: Payer: Self-pay | Admitting: Nurse Practitioner

## 2019-12-28 DIAGNOSIS — I2694 Multiple subsegmental pulmonary emboli without acute cor pulmonale: Secondary | ICD-10-CM | POA: Insufficient documentation

## 2019-12-28 DIAGNOSIS — I2699 Other pulmonary embolism without acute cor pulmonale: Secondary | ICD-10-CM | POA: Insufficient documentation

## 2019-12-28 NOTE — Telephone Encounter (Signed)
Pt sated she had blood drawn at her pulmonology and asked if she needed to come in or we could use that information from her pulmonologist.Please advise.

## 2019-12-28 NOTE — Telephone Encounter (Signed)
Spoke to patient on phone, we can hold off on lab visit this week and recheck at next visit.  Reviewed recent note and labs from pulmonary.

## 2019-12-30 ENCOUNTER — Ambulatory Visit (INDEPENDENT_AMBULATORY_CARE_PROVIDER_SITE_OTHER): Payer: Medicare HMO | Admitting: Internal Medicine

## 2019-12-30 ENCOUNTER — Encounter: Payer: Self-pay | Admitting: Internal Medicine

## 2019-12-30 ENCOUNTER — Other Ambulatory Visit: Payer: Self-pay

## 2019-12-30 VITALS — BP 110/62 | HR 108 | Ht 58.5 in | Wt 137.0 lb

## 2019-12-30 DIAGNOSIS — I2699 Other pulmonary embolism without acute cor pulmonale: Secondary | ICD-10-CM | POA: Diagnosis not present

## 2019-12-30 DIAGNOSIS — I5181 Takotsubo syndrome: Secondary | ICD-10-CM
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
## 2019-12-30 NOTE — Patient Instructions (Signed)
Medication Instructions:  - Your physician recommends that you continue on your current medications as directed. Please refer to the Current Medication list given to you today.   *If you need a refill on your cardiac medications before your next appointment, please call your pharmacy*   Lab Work: - none ordered  If you have labs (blood work) drawn today and your tests are completely normal, you will receive your results only by: . MyChart Message (if you have MyChart) OR . A paper copy in the mail If you have any lab test that is abnormal or we need to change your treatment, we will call you to review the results.   Testing/Procedures: - none ordered   Follow-Up: At [facility], you and your health needs are our priority.  As part of our continuing mission to provide you with exceptional heart care, we have created designated Provider Care Teams.  These Care Teams include your primary Cardiologist (physician) and Advanced Practice Providers (APPs -  Physician Assistants and Nurse Practitioners) who all work together to provide you with the care you need, when you need it.  We recommend signing up for the patient portal called "MyChart".  Sign up information is provided on this After Visit Summary.  MyChart is used to connect with patients for Virtual Visits (Telemedicine).  Patients are able to view lab/test results, encounter notes, upcoming appointments, etc.  Non-urgent messages can be sent to your provider as well.   To learn more about what you can do with MyChart, go to [email or web address].com.    Your next appointment:   6 month(s)  The format for your next appointment:   In Person  Provider:    You may see Christopher End, MD or one of the following Advanced Practice Providers on your designated Care Team:    Christopher Berge, NP  Ryan Dunn, PA-C  Jacquelyn Visser, PA-C    Other Instructions n/a  

## 2019-12-31 ENCOUNTER — Encounter: Payer: Self-pay | Admitting: Internal Medicine

## 2020-01-03 DIAGNOSIS — J189 Pneumonia, unspecified organism: Secondary | ICD-10-CM | POA: Diagnosis not present

## 2020-01-03 DIAGNOSIS — Z9981 Dependence on supplemental oxygen: Secondary | ICD-10-CM | POA: Diagnosis not present

## 2020-01-03 DIAGNOSIS — I2699 Other pulmonary embolism without acute cor pulmonale: Secondary | ICD-10-CM | POA: Diagnosis not present

## 2020-01-03 DIAGNOSIS — R0600 Dyspnea, unspecified: Secondary | ICD-10-CM | POA: Diagnosis not present

## 2020-01-04 DIAGNOSIS — J449 Chronic obstructive pulmonary disease, unspecified: Secondary | ICD-10-CM | POA: Diagnosis not present

## 2020-01-04 DIAGNOSIS — J45909 Unspecified asthma, uncomplicated: Secondary | ICD-10-CM | POA: Diagnosis not present

## 2020-01-17 DIAGNOSIS — Z978 Presence of other specified devices: Secondary | ICD-10-CM | POA: Diagnosis not present

## 2020-01-17 DIAGNOSIS — Z5181 Encounter for therapeutic drug level monitoring: Secondary | ICD-10-CM | POA: Diagnosis not present

## 2020-01-17 DIAGNOSIS — M5442 Lumbago with sciatica, left side: Secondary | ICD-10-CM | POA: Diagnosis not present

## 2020-01-17 DIAGNOSIS — G894 Chronic pain syndrome: Secondary | ICD-10-CM | POA: Diagnosis not present

## 2020-01-17 DIAGNOSIS — Z79899 Other long term (current) drug therapy: Secondary | ICD-10-CM | POA: Diagnosis not present

## 2020-01-17 DIAGNOSIS — G893 Neoplasm related pain (acute) (chronic): Secondary | ICD-10-CM | POA: Diagnosis not present

## 2020-01-20 ENCOUNTER — Ambulatory Visit (INDEPENDENT_AMBULATORY_CARE_PROVIDER_SITE_OTHER): Payer: Medicare HMO | Admitting: Nurse Practitioner

## 2020-01-20 ENCOUNTER — Encounter: Payer: Self-pay | Admitting: Nurse Practitioner

## 2020-01-20 ENCOUNTER — Other Ambulatory Visit: Payer: Self-pay

## 2020-01-20 VITALS — BP 119/70 | HR 94 | Temp 98.3°F

## 2020-01-20 DIAGNOSIS — I2699 Other pulmonary embolism without acute cor pulmonale: Secondary | ICD-10-CM | POA: Diagnosis not present

## 2020-01-20 DIAGNOSIS — E538 Deficiency of other specified B group vitamins: Secondary | ICD-10-CM

## 2020-01-20 DIAGNOSIS — J189 Pneumonia, unspecified organism: Secondary | ICD-10-CM | POA: Diagnosis not present

## 2020-01-20 MED ORDER — CYANOCOBALAMIN 1000 MCG/ML IJ SOLN
1000.0000 ug | Freq: Once | INTRAMUSCULAR | Status: AC
  Administered 2020-01-20: 1000 ug via INTRAMUSCULAR

## 2020-01-20 NOTE — Assessment & Plan Note (Signed)
Acute, currently being followed by pulmonary.  Continue Eliquis as prescribed by them, on review to continue for 6 months (November).  Continue continuous O2. 

## 2020-01-20 NOTE — Assessment & Plan Note (Signed)
Acute and slowly improving.  Continue current collaboration with pulmonary -- they are seeing at end of June and repeating CXR.  Recommend frequent deep breathing exercises at home and moving in home.  Continue continuous O2.  She refused PT last visit.  CBC and BMP today.

## 2020-01-20 NOTE — Patient Instructions (Signed)
Community-Acquired Pneumonia, Adult Pneumonia is an infection of the lungs. It causes swelling in the airways of the lungs. Mucus and fluid may also build up inside the airways. One type of pneumonia can happen while a person is in a hospital. A different type can happen when a person is not in a hospital (community-acquired pneumonia).  What are the causes?  This condition is caused by germs (viruses, bacteria, or fungi). Some types of germs can be passed from one person to another. This can happen when you breathe in droplets from the cough or sneeze of an infected person. What increases the risk? You are more likely to develop this condition if you:  Have a long-term (chronic) disease, such as: ? Chronic obstructive pulmonary disease (COPD). ? Asthma. ? Cystic fibrosis. ? Congestive heart failure. ? Diabetes. ? Kidney disease.  Have HIV.  Have sickle cell disease.  Have had your spleen removed.  Do not take good care of your teeth and mouth (poor dental hygiene).  Have a medical condition that increases the risk of breathing in droplets from your own mouth and nose.  Have a weakened body defense system (immune system).  Are a smoker.  Travel to areas where the germs that cause this illness are common.  Are around certain animals or the places they live. What are the signs or symptoms?  A dry cough.  A wet (productive) cough.  Fever.  Sweating.  Chest pain. This often happens when breathing deeply or coughing.  Fast breathing or trouble breathing.  Shortness of breath.  Shaking chills.  Feeling tired (fatigue).  Muscle aches. How is this treated? Treatment for this condition depends on many things. Most adults can be treated at home. In some cases, treatment must happen in a hospital. Treatment may include:  Medicines given by mouth or through an IV tube.  Being given extra oxygen.  Respiratory therapy. In rare cases, treatment for very bad pneumonia  may include:  Using a machine to help you breathe.  Having a procedure to remove fluid from around your lungs. Follow these instructions at home: Medicines  Take over-the-counter and prescription medicines only as told by your doctor. ? Only take cough medicine if you are losing sleep.  If you were prescribed an antibiotic medicine, take it as told by your doctor. Do not stop taking the antibiotic even if you start to feel better. General instructions   Sleep with your head and neck raised (elevated). You can do this by sleeping in a recliner or by putting a few pillows under your head.  Rest as needed. Get at least 8 hours of sleep each night.  Drink enough water to keep your pee (urine) pale yellow.  Eat a healthy diet that includes plenty of vegetables, fruits, whole grains, low-fat dairy products, and lean protein.  Do not use any products that contain nicotine or tobacco. These include cigarettes, e-cigarettes, and chewing tobacco. If you need help quitting, ask your doctor.  Keep all follow-up visits as told by your doctor. This is important. How is this prevented? A shot (vaccine) can help prevent pneumonia. Shots are often suggested for:  People older than 70 years of age.  People older than 70 years of age who: ? Are having cancer treatment. ? Have long-term (chronic) lung disease. ? Have problems with their body's defense system. You may also prevent pneumonia if you take these actions:  Get the flu (influenza) shot every year.  Go to the dentist as   often as told.  Wash your hands often. If you cannot use soap and water, use hand sanitizer. Contact a doctor if:  You have a fever.  You lose sleep because your cough medicine does not help. Get help right away if:  You are short of breath and it gets worse.  You have more chest pain.  Your sickness gets worse. This is very serious if: ? You are an older adult. ? Your body's defense system is weak.  You  cough up blood. Summary  Pneumonia is an infection of the lungs.  Most adults can be treated at home. Some will need treatment in a hospital.  Drink enough water to keep your pee pale yellow.  Get at least 8 hours of sleep each night. This information is not intended to replace advice given to you by your health care provider. Make sure you discuss any questions you have with your health care provider. Document Revised: 11/10/2018 Document Reviewed: 03/18/2018 [employer] Patient Education  2020 [employer]  

## 2020-01-20 NOTE — Progress Notes (Signed)
BP 119/70   Pulse 94   Temp 98.3 F (36.8 C) (Oral)   LMP  (LMP Unknown)   SpO2 98%    Subjective:    [name], female    DOB: [birth year], 69 y.o.   MRN: [id number]  HPI: Jordan Chan is a 69 y.o. female  Chief Complaint  Patient presents with  . Pneumonia    4 week f/up    PNEUMONIA Follow-up today for pneumonia, was admitted to hospital on 12/11/2019 for CAP.  Then seen in office for follow-up on 12/23/19.  She was seen by [name] with pulmonary on follow-up and had a stat CT due to right-sided chest wall pain, was noted to have a small pulmonary emboli in the right upper lobe, right middle lobe, left lower lobe.  Started on Eliquis by pulmonary -- continue for at least 6 months per pulmonary.  She is using supplemental O2 around the clock which is managed by pulmonary.  Last follow-up with pulmonary was on 01/03/20, they plan to repeat CXR in 4 weeks.  Saw Dr. Saunders Revel, cardiology, on 12/30/19, no changes made.  Her EF in 2019 was 50-55%, which was improvement from previous.    Today she reports overall slowly improving and endorses frustration with wearing O2 and being fatigued.  Still endorses low energy.  Continues to have some SOB with activity.  Recurrent headaches: no Visual changes: no Palpitations: no Dyspnea: with activity Chest pain: no Lower extremity edema: occasional on and off Dizzy/lightheaded: no  Relevant past medical, surgical, family and social history reviewed and updated as indicated. Interim medical history since our last visit reviewed. Allergies and medications reviewed and updated.  Review of Systems  Constitutional: Negative for activity change, appetite change, diaphoresis, fatigue and fever.  Respiratory: Positive for shortness of breath (with activity). Negative for cough, chest tightness and wheezing.   Cardiovascular: Positive for leg swelling (occasional on and off). Negative for chest pain and palpitations.    Gastrointestinal: Negative.   Endocrine: Negative for cold intolerance, heat intolerance, polydipsia, polyphagia and polyuria.  Neurological: Negative.   Psychiatric/Behavioral: Negative.     Per HPI unless specifically indicated above     Objective:    BP 119/70   Pulse 94   Temp 98.3 F (36.8 C) (Oral)   LMP  (LMP Unknown)   SpO2 98%   Wt Readings from Last 3 Encounters:  12/30/19 137 lb (62.1 kg)  12/23/19 140 lb (63.5 kg)  12/13/19 145 lb 9.6 oz (66 kg)    Physical Exam Vitals and nursing note reviewed.  Constitutional:      General: She is awake. She is not in acute distress.    Appearance: She is well-developed and well-groomed. She is not ill-appearing.  HENT:     Head: Normocephalic.     Right Ear: Hearing normal.     Left Ear: Hearing normal.  Eyes:     General: Lids are normal.        Right eye: No discharge.        Left eye: No discharge.     Conjunctiva/sclera: Conjunctivae normal.     Pupils: Pupils are equal, round, and reactive to light.  Neck:     Thyroid: No thyromegaly.     Vascular: No carotid bruit.  Cardiovascular:     Rate and Rhythm: Normal rate and regular rhythm.     Heart sounds: Normal heart sounds. No murmur heard.  No gallop.   Pulmonary:  Effort: Pulmonary effort is normal. No accessory muscle usage or respiratory distress.     Breath sounds: Normal breath sounds.     Comments: Much improved breath sounds on exam today, clear throughout. Abdominal:     General: Bowel sounds are normal.     Palpations: Abdomen is soft.  Musculoskeletal:     Cervical back: Normal range of motion and neck supple.     Right lower leg: No edema.     Left lower leg: No edema.  Lymphadenopathy:     Cervical: No cervical adenopathy.  Skin:    General: Skin is warm and dry.  Neurological:     Mental Status: She is alert and oriented to person, place, and time.  Psychiatric:        Attention and Perception: Attention normal.        Mood and  Affect: Mood normal.        Speech: Speech normal.        Behavior: Behavior normal. Behavior is cooperative.        Thought Content: Thought content normal.     Results for orders placed or performed during the hospital encounter of 12/27/19  Fibrin derivatives D-Dimer [facility] only)  Result Value Ref Range   Fibrin derivatives D-dimer ([facility]) 2,104.83 (H) 0.00 - 499.00 ng/mL (FEU)      Assessment & Plan:   Problem List Items Addressed This Visit      Cardiovascular and Mediastinum   Acute pulmonary embolism (HCC) - Primary    Acute, currently being followed by pulmonary.  Continue Eliquis as prescribed by them, on review to continue for 6 months (November).  Continue continuous O2.        Respiratory   Community acquired pneumonia    Acute and slowly improving.  Continue current collaboration with pulmonary -- they are seeing at end of June and repeating CXR.  Recommend frequent deep breathing exercises at home and moving in home.  Continue continuous O2.  She refused PT last visit.  CBC and BMP today.      Relevant Orders   Basic metabolic panel   CBC with Differential/Platelet     Other   B12 deficiency       Follow up plan: Return in about 9 weeks (around 03/23/2020) for T2DM, HTN/HLD, COPD, PNA, OSA, Chronic pain.

## 2020-01-21 LAB — BASIC METABOLIC PANEL
BUN/Creatinine Ratio: 16 (ref 12–28)
BUN: 11 mg/dL (ref 8–27)
CO2: 25 mmol/L (ref 20–29)
Calcium: 9.3 mg/dL (ref 8.7–10.3)
Chloride: 104 mmol/L (ref 96–106)
Creatinine, Ser: 0.69 mg/dL (ref 0.57–1.00)
GFR calc Af Amer: 103 mL/min/{1.73_m2} (ref >59)
GFR calc non Af Amer: 89 mL/min/{1.73_m2} (ref >59)
Glucose: 117 mg/dL — ABNORMAL HIGH (ref 65–99)
Potassium: 5.2 mmol/L (ref 3.5–5.2)
Sodium: 144 mmol/L (ref 134–144)

## 2020-01-21 LAB — CBC WITH DIFFERENTIAL/PLATELET
Basophils Absolute: 0.1 10*3/uL (ref 0.0–0.2)
Basos: 1 %
EOS (ABSOLUTE): 0.4 10*3/uL (ref 0.0–0.4)
Eos: 4 %
Hematocrit: 34.4 % (ref 34.0–46.6)
Hemoglobin: 11.1 g/dL (ref 11.1–15.9)
Immature Grans (Abs): 0 10*3/uL (ref 0.0–0.1)
Immature Granulocytes: 0 %
Lymphocytes Absolute: 3.2 10*3/uL — ABNORMAL HIGH (ref 0.7–3.1)
Lymphs: 35 %
MCH: 28.5 pg (ref 26.6–33.0)
MCHC: 32.3 g/dL (ref 31.5–35.7)
MCV: 88 fL (ref 79–97)
Monocytes Absolute: 0.7 10*3/uL (ref 0.1–0.9)
Monocytes: 8 %
Neutrophils Absolute: 4.7 10*3/uL (ref 1.4–7.0)
Neutrophils: 52 %
Platelets: 393 10*3/uL (ref 150–450)
RBC: 3.89 x10E6/uL (ref 3.77–5.28)
RDW: 16.6 % — ABNORMAL HIGH (ref 11.7–15.4)
WBC: 9.1 10*3/uL (ref 3.4–10.8)

## 2020-01-21 NOTE — Progress Notes (Signed)
Good morning.  Please let Jordan Chan know her labs have returned and CBC is improving, no further elevation in white blood cell count.  Kidney function and electrolytes remain good.  Have a great day!!

## 2020-01-24 ENCOUNTER — Ambulatory Visit (INDEPENDENT_AMBULATORY_CARE_PROVIDER_SITE_OTHER): Payer: Medicare HMO | Admitting: Pharmacist

## 2020-01-24 DIAGNOSIS — E1159 Type 2 diabetes mellitus with other circulatory complications: Secondary | ICD-10-CM

## 2020-01-24 DIAGNOSIS — I10 Essential (primary) hypertension: Secondary | ICD-10-CM | POA: Diagnosis not present

## 2020-01-24 DIAGNOSIS — Z794 Long term (current) use of insulin: Secondary | ICD-10-CM

## 2020-01-24 DIAGNOSIS — I152 Hypertension secondary to endocrine disorders: Secondary | ICD-10-CM

## 2020-01-24 DIAGNOSIS — J449 Chronic obstructive pulmonary disease, unspecified: Secondary | ICD-10-CM | POA: Diagnosis not present

## 2020-01-24 DIAGNOSIS — F324 Major depressive disorder, single episode, in partial remission: Secondary | ICD-10-CM

## 2020-01-24 DIAGNOSIS — E1140 Type 2 diabetes mellitus with diabetic neuropathy, unspecified: Secondary | ICD-10-CM

## 2020-01-24 NOTE — Chronic Care Management (AMB) (Signed)
Chronic Care Management   Follow Up Note   01/24/2020 Name: Jordan Chan MRN: [id number] DOB: 1951/02/15  Referred by: Jordan Lick, NP Reason for referral : Chronic Care Management (Medication Management)   Jordan Chan is a 69 y.o. year old female who is a primary care patient of [name], Jordan Faster, NP. The CCM team was consulted for assistance with chronic disease management and care coordination needs.    Review of patient status, including review of consultants reports, relevant laboratory and other test results, and collaboration with appropriate care team members and the patient's provider was performed as part of comprehensive patient evaluation and provision of chronic care management services.    SDOH (Social Determinants of Health) assessments performed: Yes See Care Plan activities for detailed interventions related to SDOH)  SDOH Interventions     Most Recent Value  SDOH Interventions  Stress Interventions Provide Counseling, Other (Comment)  [LCSW referral]       Outpatient Encounter Medications as of 01/24/2020  Medication Sig  . ACCU-CHEK AVIVA PLUS test strip TEST THREE TIMES DAILY  . Accu-Chek Softclix Lancets lancets TEST BLOOD SUGAR THREE TIMES DAILY  . apixaban (ELIQUIS) 5 MG TABS tablet Take 5 mg by mouth 2 (two) times daily.   [name] atorvastatin (LIPITOR) 20 MG tablet Take 1 tablet (20 mg total) by mouth daily.  . cholecalciferol (VITAMIN D3) 25 MCG (1000 UNIT) tablet Take 1,000 Units by mouth daily.  [name] diltiazem (CARDIZEM CD) 180 MG 24 hr capsule Take 1 capsule (180 mg total) by mouth daily.  . Dulaglutide (TRULICITY) 1.5 OV/5.6EP SOPN Inject 1.5 mg into the skin once a week.  . gabapentin (NEURONTIN) 800 MG tablet Take 1 tablet (800 mg total) by mouth 3 (three) times daily.  . insulin degludec (TRESIBA FLEXTOUCH) 100 UNIT/ML FlexTouch Pen Inject 0.12 mLs (12 Units total) into the skin at bedtime.  . metFORMIN (GLUCOPHAGE) 1000 MG tablet TAKE 1  TABLET TWICE DAILY WITH MEALS  . pantoprazole (PROTONIX) 40 MG tablet TAKE 1 TABLET TWICE DAILY  . QUEtiapine (SEROQUEL) 25 MG tablet Take 1 tablet (25 mg total) by mouth at bedtime as needed (insomnia).  . TRELEGY ELLIPTA 100-62.5-25 MCG/INH AEPB INHALE 1 PUFF INTO THE LUNGS DAILY.  [name] venlafaxine XR (EFFEXOR-XR) 150 MG 24 hr capsule Take 1 capsule (150 mg total) by mouth daily.  [name] albuterol (PROVENTIL HFA;VENTOLIN HFA) 108 (90 BASE) MCG/ACT inhaler Inhale 2 puffs into the lungs every 6 (six) hours as needed for wheezing or shortness of breath. (Patient not taking: Reported on 01/24/2020)  . cyclobenzaprine (FLEXERIL) 5 MG tablet Take 5 mg by mouth 2 (two) times daily as needed for muscle spasms.  (Patient not taking: Reported on 01/24/2020)  . lactulose (CHRONULAC) 10 GM/15ML solution Take 45 mLs (30 g total) by mouth daily as needed for mild constipation or severe constipation. (Patient not taking: Reported on 01/24/2020)  . LORazepam (ATIVAN) 0.5 MG tablet Take 2 tablets (1 mg total) by mouth every 8 (eight) hours as needed for anxiety. (Patient not taking: Reported on 01/24/2020)  . [DISCONTINUED] benzocaine (ORAJEL) 10 % mucosal gel Use as directed in the mouth or throat 4 (four) times daily as needed for mouth pain. (Patient not taking: Reported on 01/20/2020)  . [DISCONTINUED] clotrimazole (CLOTRIMAZOLE ANTI-FUNGAL) 1 % cream Apply 1 application topically 2 (two) times daily. (Patient not taking: Reported on 01/20/2020)  . [DISCONTINUED] Glucerna (GLUCERNA) LIQD Take 237 mLs by mouth daily. (Patient not taking: Reported on 01/20/2020)  . [  DISCONTINUED] saccharomyces boulardii (FLORASTOR) 250 MG capsule Take 250 mg by mouth 2 (two) times daily.  . [DISCONTINUED] triamcinolone ointment (KENALOG) 0.1 % Apply 1 application topically 2 (two) times daily. (Patient not taking: Reported on 01/20/2020)   No facility-administered encounter medications on file as of 01/24/2020.     Objective:   Goals  Addressed              This Visit's Progress     Patient Stated   .  "I want to get my blood sugars under control" (pt-stated)        CARE PLAN ENTRY (see longtitudinal plan of care for additional care plan information)  Current Barriers:  . Diabetes: uncontrolled, most recent A1c 7.1% (during hospitalization) o Patient reports her mood is overall poor, frustrated w/ current level of health, lack of energy and having to wear O2 24 hours. Declines Pt/pulmonary rehab d/t not wanting to be around people  o Concerned about sugar elevation, though reports things are better now that she is off prednisone . Current antihyperglycemic regimen: Trulicity 1.5 mg, metformin 1000 mg BID, Tresiba 12 units daily  . Current glucose readings:  o Reports sugars have improved now that she is off prednisone o Fasting: 135-140s o 2 hour after meals: ~180s, sometimes 200s . Cardiovascular risk reduction: o Current hypertensive regimen: diltiazem 180 mg daily; home BP readings ~120-130s/60-70s (cardiology would like to switch back to BB eventually d/t cardiomyopathy) o Current hyperlipidemia regimen: atorvastatin 20 mg daily, LDL at goal <70;  o Current antiplatelet regimen: none right now d/t Eliquis . COPD; Trelegy QAM, albuterol PRN (has not needed a dose recently); O2 Q24 hours  . Hx PE: Eliquis 5 mg BID per [name]. Will re-evaluate after 6 months tx. Denies any s/sx bleeding/brusing . Chronic pain: follows w/ pain management; gabapentin 800 mg TID, cyclobenzaprine 5 mg BID PRN  . Mood: venlafaxine XR 150 mg QAM, prescribed lorazepam, but has not taken recently; prescribed quetiapine 25 mg daily for sleep, but patient notes that she has had no improvement in sleep with this medication. Reports hx of taking melatonin for 3-4 weeks w/ no benefit. Notes that she only sleeps in ~1 hr chunks at a time, ends up waking up to use the rest room, being in pain, or being disturbed by her cat  Pharmacist  Clinical Goal(s):  [name] Over the next 90 days, patient with work with PharmD and primary care provider to address optimized diabetes management  Interventions: . Comprehensive medication review performed, medication list updated in electronic medical record . Inter-disciplinary care team collaboration (see longitudinal plan of care) . Recommend increasing Tresiba to 14 units daily. Patient verbalizes understanding.  . Provided empathetic listening. Offered LCSW referral for mental health support. Patient agreeable.  . Consider benefit vs risk of continuing quetiapine, if patient not perceiving benefit. Patient is going to re-try melatonin QPM to see if this helps her sleep. Will ask LCSW to address relaxation and sleep hygiene as well.  . Discussed concepts of tx duration of provoked vs unprovoked DVT. Continue to follow pulmonary plan for tx.  Patient Self Care Activities:  . Patient will check blood glucose BID, document, and provide at future appointments . Patient will report any questions or concerns to provider   Please see past updates related to this goal by clicking on the "Past Updates" button in the selected goal          Plan:  - Scheduled f/u call in ~  12 weeks  Catie Darnelle Maffucci, PharmD, [place] [phone]

## 2020-01-24 NOTE — Patient Instructions (Signed)
Visit Information  Goals Addressed              This Visit's Progress     Patient Stated   .  "I want to get my blood sugars under control" (pt-stated)        CARE PLAN ENTRY (see longtitudinal plan of care for additional care plan information)  Current Barriers:  . Diabetes: uncontrolled, most recent A1c 7.1% (during hospitalization) o Patient reports her mood is overall poor, frustrated w/ current level of health, lack of energy and having to wear O2 24 hours. Declines Pt/pulmonary rehab d/t not wanting to be around people  o Concerned about sugar elevation, though reports things are better now that she is off prednisone . Current antihyperglycemic regimen: Trulicity 1.5 mg, metformin 1000 mg BID, Tresiba 12 units daily  . Current glucose readings:  o Reports sugars have improved now that she is off prednisone o Fasting: 135-140s o 2 hour after meals: ~180s, sometimes 200s . Cardiovascular risk reduction: o Current hypertensive regimen: diltiazem 180 mg daily; home BP readings ~120-130s/60-70s (cardiology would like to switch back to BB eventually d/t cardiomyopathy) o Current hyperlipidemia regimen: atorvastatin 20 mg daily, LDL at goal <70;  o Current antiplatelet regimen: none right now d/t Eliquis . COPD; Trelegy QAM, albuterol PRN (has not needed a dose recently); O2 Q24 hours  . Hx PE: Eliquis 5 mg BID per [name]. Will re-evaluate after 6 months tx. Denies any s/sx bleeding/brusing . Chronic pain: follows w/ pain management; gabapentin 800 mg TID, cyclobenzaprine 5 mg BID PRN  . Mood: venlafaxine XR 150 mg QAM, prescribed lorazepam, but has not taken recently; prescribed quetiapine 25 mg daily for sleep, but patient notes that she has had no improvement in sleep with this medication. Reports hx of taking melatonin for 3-4 weeks w/ no benefit. Notes that she only sleeps in ~1 hr chunks at a time, ends up waking up to use the rest room, being in pain, or being disturbed by  her cat  Pharmacist Clinical Goal(s):  [name] Over the next 90 days, patient with work with PharmD and primary care provider to address optimized diabetes management  Interventions: . Comprehensive medication review performed, medication list updated in electronic medical record . Inter-disciplinary care team collaboration (see longitudinal plan of care) . Recommend increasing Tresiba to 14 units daily. Patient verbalizes understanding.  . Provided empathetic listening. Offered LCSW referral for mental health support. Patient agreeable.  . Consider benefit vs risk of continuing quetiapine, if patient not perceiving benefit. Patient is going to re-try melatonin QPM to see if this helps her sleep. Will ask LCSW to address relaxation and sleep hygiene as well.  . Discussed concepts of tx duration of provoked vs unprovoked DVT. Continue to follow pulmonary plan for tx.  Patient Self Care Activities:  . Patient will check blood glucose BID, document, and provide at future appointments . Patient will report any questions or concerns to provider   Please see past updates related to this goal by clicking on the "Past Updates" button in the selected goal         The patient verbalized understanding of instructions provided today and declined a print copy of patient instruction materials.   Plan:  - Scheduled f/u call in ~ 12 weeks  Catie Darnelle Maffucci, PharmD, [place] [phone]

## 2020-01-31 DIAGNOSIS — S2231XA Fracture of one rib, right side, initial encounter for closed fracture: Secondary | ICD-10-CM | POA: Diagnosis not present

## 2020-01-31 DIAGNOSIS — J441 Chronic obstructive pulmonary disease with (acute) exacerbation: Secondary | ICD-10-CM | POA: Diagnosis not present

## 2020-01-31 DIAGNOSIS — Z9981 Dependence on supplemental oxygen: Secondary | ICD-10-CM | POA: Diagnosis not present

## 2020-01-31 DIAGNOSIS — R0602 Shortness of breath: Secondary | ICD-10-CM | POA: Diagnosis not present

## 2020-01-31 DIAGNOSIS — I2699 Other pulmonary embolism without acute cor pulmonale: Secondary | ICD-10-CM | POA: Diagnosis not present

## 2020-01-31 DIAGNOSIS — R0600 Dyspnea, unspecified: Secondary | ICD-10-CM | POA: Diagnosis not present

## 2020-02-03 DIAGNOSIS — J45909 Unspecified asthma, uncomplicated: Secondary | ICD-10-CM | POA: Diagnosis not present

## 2020-02-03 DIAGNOSIS — J449 Chronic obstructive pulmonary disease, unspecified: Secondary | ICD-10-CM | POA: Diagnosis not present

## 2020-02-08 ENCOUNTER — Other Ambulatory Visit: Payer: Self-pay | Admitting: Nurse Practitioner

## 2020-02-10 ENCOUNTER — Other Ambulatory Visit: Payer: Self-pay | Admitting: Nurse Practitioner

## 2020-02-10 ENCOUNTER — Ambulatory Visit: Payer: Self-pay

## 2020-02-10 DIAGNOSIS — Z1231 Encounter for screening mammogram for malignant neoplasm of breast: Secondary | ICD-10-CM

## 2020-02-10 NOTE — Telephone Encounter (Signed)
Pt. Asking when last Vit B 12 injection was - it was 01/20/20. Verbalizes understanding.

## 2020-02-14 ENCOUNTER — Ambulatory Visit: Payer: Medicare HMO | Admitting: Nurse Practitioner

## 2020-02-17 ENCOUNTER — Other Ambulatory Visit: Payer: Self-pay | Admitting: Nurse Practitioner

## 2020-02-20 ENCOUNTER — Other Ambulatory Visit: Payer: Self-pay | Admitting: Nurse Practitioner

## 2020-02-20 ENCOUNTER — Ambulatory Visit (INDEPENDENT_AMBULATORY_CARE_PROVIDER_SITE_OTHER): Payer: Medicare HMO

## 2020-02-20 ENCOUNTER — Other Ambulatory Visit: Payer: Self-pay

## 2020-02-20 DIAGNOSIS — E538 Deficiency of other specified B group vitamins: Secondary | ICD-10-CM | POA: Diagnosis not present

## 2020-02-20 MED ORDER — CYANOCOBALAMIN 1000 MCG/ML IJ SOLN
1000.0000 ug | Freq: Once | INTRAMUSCULAR | Status: AC
  Administered 2020-02-20: 1000 ug via INTRAMUSCULAR

## 2020-02-20 NOTE — Telephone Encounter (Signed)
Requested medication (s) are due for refill today: Yes  Requested medication (s) are on the active medication list: Yes  Last refill:  12/20/19  Future visit scheduled: Yes  Notes to clinic:  Last ordered by Dr. Leslye Peer    Requested Prescriptions  Pending Prescriptions Disp Refills   TRESIBA FLEXTOUCH 100 UNIT/ML FlexTouch Pen [Pharmacy Med Name: TRESIBA FLEXTOUCH 100 UNIT/ML Solution Pen-injector] 30 mL 1    Sig: INJECT 30 UNITS INTO THE SKIN AT BEDTIME.      Endocrinology:  Diabetes - Insulins Passed - 02/20/2020  1:20 PM      Passed - HBA1C is between 0 and 7.9 and within 180 days    Hemoglobin A1C  Date Value Ref Range Status  04/28/2016 7.7%  Final   HB A1C (BAYER DCA - WAIVED)  Date Value Ref Range Status  11/15/2019 7.0 (H) <7.0 % Final    Comment:                                          Diabetic Adult            <7.0                                       Healthy Adult        4.3 - 5.7                                                           (DCCT/NGSP) American Diabetes Association's Summary of Glycemic Recommendations for Adults with Diabetes: Hemoglobin A1c <7.0%. More stringent glycemic goals (A1c <6.0%) may further reduce complications at the cost of increased risk of hypoglycemia.    Hgb A1c MFr Bld  Date Value Ref Range Status  12/12/2019 7.1 (H) 4.8 - 5.6 % Final    Comment:    (NOTE) Pre diabetes:          5.7%-6.4% Diabetes:              >6.4% Glycemic control for   <7.0% adults with diabetes           Passed - Valid encounter within last 6 months    Recent Outpatient Visits           1 month ago Acute pulmonary embolism without acute cor pulmonale, unspecified pulmonary embolism type ([place])   [place], [name], NP   1 month ago Community acquired pneumonia of right lower lobe of lung   [place], [place] T, NP   2 months ago Angular cheilitis   [place], [place] T, NP   2  months ago Angular cheilitis   [place], [place] T, NP   3 months ago Type 2 diabetes mellitus with diabetic neuropathy, with long-term current use of insulin ([place])   [place], [name], NP       Future Appointments             In 1 month [name], [name], NP [employer], [facility]   In 4 months End, Harrell Gave, MD [facility]  [facility]   In 9 months  [employer], [place]

## 2020-02-20 NOTE — Telephone Encounter (Signed)
Routing to provider  

## 2020-02-22 ENCOUNTER — Other Ambulatory Visit: Payer: Self-pay | Admitting: Nurse Practitioner

## 2020-03-02 ENCOUNTER — Telehealth: Payer: Self-pay

## 2020-03-05 DIAGNOSIS — J449 Chronic obstructive pulmonary disease, unspecified: Secondary | ICD-10-CM | POA: Diagnosis not present

## 2020-03-05 DIAGNOSIS — J45909 Unspecified asthma, uncomplicated: Secondary | ICD-10-CM | POA: Diagnosis not present

## 2020-03-12 ENCOUNTER — Ambulatory Visit
Admission: RE | Admit: 2020-03-12 | Discharge: 2020-03-12 | Disposition: A | Discharge status: Home / Self Care | Payer: Medicare HMO | Source: Ambulatory Visit | Attending: Nurse Practitioner | Admitting: Nurse Practitioner

## 2020-03-12 ENCOUNTER — Other Ambulatory Visit: Payer: Self-pay

## 2020-03-12 DIAGNOSIS — Z1231 Encounter for screening mammogram for malignant neoplasm of breast: Secondary | ICD-10-CM | POA: Diagnosis present

## 2020-03-14 DIAGNOSIS — E119 Type 2 diabetes mellitus without complications: Secondary | ICD-10-CM | POA: Diagnosis not present

## 2020-03-14 LAB — HM DIABETES EYE EXAM

## 2020-03-19 ENCOUNTER — Ambulatory Visit (INDEPENDENT_AMBULATORY_CARE_PROVIDER_SITE_OTHER): Payer: Medicare HMO | Admitting: Licensed Clinical Social Worker

## 2020-03-19 DIAGNOSIS — I10 Essential (primary) hypertension: Secondary | ICD-10-CM

## 2020-03-19 DIAGNOSIS — Z794 Long term (current) use of insulin: Secondary | ICD-10-CM

## 2020-03-19 DIAGNOSIS — E1159 Type 2 diabetes mellitus with other circulatory complications: Secondary | ICD-10-CM | POA: Diagnosis not present

## 2020-03-19 DIAGNOSIS — I152 Hypertension secondary to endocrine disorders: Secondary | ICD-10-CM

## 2020-03-19 DIAGNOSIS — E1140 Type 2 diabetes mellitus with diabetic neuropathy, unspecified: Secondary | ICD-10-CM

## 2020-03-19 DIAGNOSIS — F324 Major depressive disorder, single episode, in partial remission: Secondary | ICD-10-CM | POA: Diagnosis not present

## 2020-03-19 DIAGNOSIS — J449 Chronic obstructive pulmonary disease, unspecified: Secondary | ICD-10-CM | POA: Diagnosis not present

## 2020-03-19 NOTE — Chronic Care Management (AMB) (Signed)
Chronic Care Management    Clinical Social Work General Note  03/19/2020 Name: Jordan Chan MRN: [id number] DOB: 04-14-1951  Jordan Chan is a 69 y.o. year old female who is a primary care patient of [name], [name], NP. The CCM was consulted to assist the patient with Mental Health Counseling and Resources.   Jordan Chan was given information about Chronic Care Management services today including:  1. CCM service includes personalized support from designated clinical staff supervised by her physician, including individualized plan of care and coordination with other care providers 2. 24/7 contact phone numbers for assistance for urgent and routine care needs. 3. Service will only be billed when office clinical staff spend 20 minutes or more in a month to coordinate care. 4. Only one practitioner may furnish and bill the service in a calendar month. 5. The patient may stop CCM services at any time (effective at the end of the month) by phone call to the office staff. 6. The patient will be responsible for cost sharing (co-pay) of up to 20% of the service fee (after annual deductible is met).  Patient agreed to services and verbal consent obtained.   Review of patient status, including review of consultants reports, relevant laboratory and other test results, and collaboration with appropriate care team members and the patient's provider was performed as part of comprehensive patient evaluation and provision of chronic care management services.    SDOH (Social Determinants of Health) assessments and interventions performed:  Yes    Outpatient Encounter Medications as of 03/19/2020  Medication Sig  . ACCU-CHEK AVIVA PLUS test strip TEST THREE TIMES DAILY  . Accu-Chek Softclix Lancets lancets TEST BLOOD SUGAR THREE TIMES DAILY  . albuterol (PROVENTIL HFA;VENTOLIN HFA) 108 (90 BASE) MCG/ACT inhaler Inhale 2 puffs into the lungs every 6 (six) hours as needed for wheezing or  shortness of breath. (Patient not taking: Reported on 01/24/2020)  . apixaban (ELIQUIS) 5 MG TABS tablet Take 5 mg by mouth 2 (two) times daily.   [name] atorvastatin (LIPITOR) 20 MG tablet TAKE 1 TABLET EVERY DAY  . cholecalciferol (VITAMIN D3) 25 MCG (1000 UNIT) tablet Take 1,000 Units by mouth daily.  . cyclobenzaprine (FLEXERIL) 5 MG tablet Take 5 mg by mouth 2 (two) times daily as needed for muscle spasms.  (Patient not taking: Reported on 01/24/2020)  . diltiazem (CARDIZEM CD) 180 MG 24 hr capsule Take 1 capsule (180 mg total) by mouth daily.  . Dulaglutide (TRULICITY) 1.5 ZS/8.2LM SOPN Inject 1.5 mg into the skin once a week.  . gabapentin (NEURONTIN) 800 MG tablet Take 1 tablet (800 mg total) by mouth 3 (three) times daily.  [name] lactulose (CHRONULAC) 10 GM/15ML solution Take 45 mLs (30 g total) by mouth daily as needed for mild constipation or severe constipation. (Patient not taking: Reported on 01/24/2020)  . LORazepam (ATIVAN) 0.5 MG tablet Take 2 tablets (1 mg total) by mouth every 8 (eight) hours as needed for anxiety. (Patient not taking: Reported on 01/24/2020)  . metFORMIN (GLUCOPHAGE) 1000 MG tablet TAKE 1 TABLET TWICE DAILY WITH MEALS  . pantoprazole (PROTONIX) 40 MG tablet TAKE 1 TABLET TWICE DAILY  . QUEtiapine (SEROQUEL) 25 MG tablet Take 1 tablet (25 mg total) by mouth at bedtime as needed (insomnia).  . TRELEGY ELLIPTA 100-62.5-25 MCG/INH AEPB INHALE 1 PUFF INTO THE LUNGS DAILY.  [name] TRESIBA FLEXTOUCH 100 UNIT/ML FlexTouch Pen INJECT 30 UNITS INTO THE SKIN AT BEDTIME.  [name] venlafaxine XR (EFFEXOR-XR) 150 MG 24  hr capsule Take 1 capsule (150 mg total) by mouth daily.   No facility-administered encounter medications on file as of 03/19/2020.    Follow Up Plan: SW will follow up with patient by phone over the next 60 days      Jordan Chan, [place], MSW, [email or web address] Phone:  [phone]

## 2020-03-20 ENCOUNTER — Other Ambulatory Visit: Payer: Self-pay | Admitting: Nurse Practitioner

## 2020-03-20 MED ORDER — GABAPENTIN 800 MG PO TABS: 800.0000 mg | ORAL_TABLET | Freq: Three times a day (TID) | ORAL | 0 refills | Status: DC

## 2020-03-20 NOTE — Telephone Encounter (Signed)
Copied from [place] [phone]. Topic: Quick Communication - Rx Refill/Question >> Mar 20, 2020  1:50 PM Leward Quan A wrote: Medication: gabapentin (NEURONTIN) 800 MG tablet  Has the patient contacted their pharmacy? Yes.   (Agent: If no, request that the patient contact the pharmacy for the refill.) (Agent: If yes, when and what did the pharmacy advise?)  Preferred Pharmacy (with phone number or street name): [place], [place]  Phone:  [phone] Fax:  [phone]     Agent: Please be advised that RX refills may take up to 3 business days. We ask that you follow-up with your pharmacy.

## 2020-03-21 ENCOUNTER — Other Ambulatory Visit: Payer: Self-pay | Admitting: Nurse Practitioner

## 2020-03-21 NOTE — Telephone Encounter (Signed)
Approved per protocol. Patient has appointment later this month.

## 2020-03-22 ENCOUNTER — Other Ambulatory Visit: Payer: Self-pay | Admitting: Nurse Practitioner

## 2020-03-23 ENCOUNTER — Ambulatory Visit (INDEPENDENT_AMBULATORY_CARE_PROVIDER_SITE_OTHER): Payer: Medicare HMO

## 2020-03-23 ENCOUNTER — Other Ambulatory Visit: Payer: Self-pay

## 2020-03-23 DIAGNOSIS — E538 Deficiency of other specified B group vitamins: Secondary | ICD-10-CM | POA: Diagnosis not present

## 2020-03-23 MED ORDER — CYANOCOBALAMIN 1000 MCG/ML IJ SOLN
1000.0000 ug | Freq: Once | INTRAMUSCULAR | Status: AC
  Administered 2020-03-23: 1000 ug via INTRAMUSCULAR

## 2020-03-27 DIAGNOSIS — Z978 Presence of other specified devices: Secondary | ICD-10-CM | POA: Diagnosis not present

## 2020-03-27 DIAGNOSIS — G894 Chronic pain syndrome: Secondary | ICD-10-CM | POA: Diagnosis not present

## 2020-03-27 DIAGNOSIS — G893 Neoplasm related pain (acute) (chronic): Secondary | ICD-10-CM | POA: Diagnosis not present

## 2020-03-27 DIAGNOSIS — M5442 Lumbago with sciatica, left side: Secondary | ICD-10-CM | POA: Diagnosis not present

## 2020-03-28 ENCOUNTER — Other Ambulatory Visit: Payer: Self-pay

## 2020-03-28 ENCOUNTER — Encounter: Payer: Self-pay | Admitting: Nurse Practitioner

## 2020-03-28 ENCOUNTER — Ambulatory Visit (INDEPENDENT_AMBULATORY_CARE_PROVIDER_SITE_OTHER): Payer: Medicare HMO | Admitting: Nurse Practitioner

## 2020-03-28 VITALS — BP 123/67 | HR 95 | Temp 99.1°F | Wt 138.4 lb

## 2020-03-28 DIAGNOSIS — Z794 Long term (current) use of insulin: Secondary | ICD-10-CM

## 2020-03-28 DIAGNOSIS — I2699 Other pulmonary embolism without acute cor pulmonale: Secondary | ICD-10-CM

## 2020-03-28 DIAGNOSIS — E538 Deficiency of other specified B group vitamins: Secondary | ICD-10-CM

## 2020-03-28 DIAGNOSIS — E1159 Type 2 diabetes mellitus with other circulatory complications: Secondary | ICD-10-CM

## 2020-03-28 DIAGNOSIS — E1140 Type 2 diabetes mellitus with diabetic neuropathy, unspecified: Secondary | ICD-10-CM

## 2020-03-28 DIAGNOSIS — E785 Hyperlipidemia, unspecified: Secondary | ICD-10-CM | POA: Diagnosis not present

## 2020-03-28 DIAGNOSIS — G894 Chronic pain syndrome: Secondary | ICD-10-CM | POA: Diagnosis not present

## 2020-03-28 DIAGNOSIS — E66811 Obesity, class 1: Secondary | ICD-10-CM

## 2020-03-28 DIAGNOSIS — I10 Essential (primary) hypertension: Secondary | ICD-10-CM

## 2020-03-28 DIAGNOSIS — Z6830 Body mass index (BMI) 30.0-30.9, adult: Secondary | ICD-10-CM

## 2020-03-28 DIAGNOSIS — E1169 Type 2 diabetes mellitus with other specified complication: Secondary | ICD-10-CM | POA: Diagnosis not present

## 2020-03-28 DIAGNOSIS — J449 Chronic obstructive pulmonary disease, unspecified: Secondary | ICD-10-CM

## 2020-03-28 DIAGNOSIS — I152 Hypertension secondary to endocrine disorders: Secondary | ICD-10-CM

## 2020-03-28 DIAGNOSIS — F324 Major depressive disorder, single episode, in partial remission: Secondary | ICD-10-CM | POA: Diagnosis not present

## 2020-03-28 DIAGNOSIS — E6609 Other obesity due to excess calories: Secondary | ICD-10-CM | POA: Diagnosis not present

## 2020-03-28 LAB — BAYER DCA HB A1C WAIVED: HB A1C (BAYER DCA - WAIVED): 7.3 % — ABNORMAL HIGH (ref <7.0)

## 2020-03-28 MED ORDER — TRULICITY 3 MG/0.5ML ~~LOC~~ SOAJ: 3.0000 mg | SUBCUTANEOUS | 4 refills | Status: DC

## 2020-03-28 NOTE — Assessment & Plan Note (Signed)
Chronic, ongoing.  Continue Trelegy which offers benefit of medication minimization and has improved symptoms overall.  Continue to collaborate with [name] and review notes.

## 2020-03-28 NOTE — Assessment & Plan Note (Signed)
Chronic, stable.  Continue current medication regimen and adjust as needed.  Lipid panel today. 

## 2020-03-28 NOTE — Assessment & Plan Note (Signed)
Check B12 level today and continue injections.

## 2020-03-28 NOTE — Assessment & Plan Note (Signed)
Recommended eating smaller high protein, low fat meals more frequently and exercising 30 mins a day 5 times a week with a goal of 10-15lb weight loss in the next 3 months. Patient voiced their understanding and motivation to adhere to these recommendations.  

## 2020-03-28 NOTE — Assessment & Plan Note (Signed)
Continue to collaborate with chronic pain clinic in [place].  Has morphine pump.   

## 2020-03-28 NOTE — Assessment & Plan Note (Signed)
Chronic, stable.  Denies SI/HI.  Mood well-controlled.  Continue current medication regimen and adjust as needed.  Refills as needed.  Consider trial of Duloxetine and transition off Effexor for dual pain and mood benefit.

## 2020-03-28 NOTE — Progress Notes (Signed)
BP 123/67   Pulse 95   Temp 99.1 F (37.3 C) (Oral)   Wt 138 lb 6.4 oz (62.8 kg)   LMP  (LMP Unknown)   SpO2 98%   BMI 28.43 kg/m    Subjective:    [name], female    DOB: [birth year], 69 y.o.   MRN: [id number]  HPI: Jordan Chan is a 69 y.o. female  Chief Complaint  Patient presents with  . Depression  . Diabetes  . Hyperlipidemia  . Hypertension   DIABETES On Trulicity, taking 1.5 MG weekly.  Continues on Metformin 1000 MG BID and Tresiba 20 units at HS.  Working with CCM team and provider. Recent A1C in May 7.1%.  She also continues on monthly B12 shots for low B12 levels.  Hypoglycemic episodes: none Polydipsia/polyuria: no Visual disturbance: no Chest pain: no Paresthesias: no Glucose Monitoring: yes  Accucheck frequency: BID  Fasting glucose: 128 this morning -- on average 135, has occasional 180  Post prandial: after lunch 160 to 260  Evening:   Before meals: Taking Insulin?: yes  Long acting insulin: Tresiba 20 units daily  Short acting insulin: Blood Pressure Monitoring: just got cuff yesterday Retinal Examination: Up to Date Foot Exam: Up to Date Pneumovax: Up to Date Influenza: Up to Date Aspirin: yes   HYPERTENSION / HYPERLIPIDEMIA Continues on Atorvastatin 20 MG, Lisinopril 5 MG, Metoprolol 12.5 MG daily + ASA. Recent labs with LDL 36 and TCHOL 127. Satisfied with current treatment? yes Duration of hypertension: chronic BP monitoring frequency: a few times a week BP range:  120-130/60-70 BP medication side effects: no Duration of hyperlipidemia: chronic Cholesterol medication side effects: no Cholesterol supplements: none Medication compliance: good compliance Aspirin: yes Recent stressors: no Recurrent headaches: no Visual changes: no Palpitations: no Dyspnea: no Chest pain: no Lower extremity edema: no Dizzy/lightheaded: no   COPD Followed by pulmonary for PE and COPD -- last saw 01/31/20.  Currently  using Trelegy.  Has a history of smoking, quit 15 years ago. She does not use CPAP, has tried and it caused anxiety, uses O2 at home 2.5 L [place].  Is to stay on Apixaban for 3 - 6 months == would place 03/28/20 or 06/28/20.  Continues on 2 L O2 continuous since PE and PNA.   COPD status: controlled Satisfied with current treatment?: yes Oxygen use: no Dyspnea frequency: none Cough frequency: none Rescue inhaler frequency:  minimal Limitation of activity: no Productive cough: none Last Spirometry: with pulmonary Pneumovax: Up to Date Influenza: Up to Date  CHRONIC PAIN: Followed by [facility] in [place], last seen 03/28/2020 -- was started on Norco for pain yesterday and continues pump.  Continues to be followed by clinic nurse for pump check and replacement. She states her pain is well-controlled with current regimen.  Vitamin D on low side (17.5) in June, taking supplement with recent level improved at 31.7.  No recent falls or fractures.  Normal DEXA 2017.  DEPRESSION Continues on Effexor 150 MG daily. Mood status: controlled Satisfied with current treatment?: yes Symptom severity: mild  Duration of current treatment : chronic Side effects: no Medication compliance: good compliance Psychotherapy/counseling: has gone in the past Depressed mood: yes Anxious mood: no Anhedonia: no Significant weight loss or gain: no Insomnia: none Fatigue: no Feelings of worthlessness or guilt: no Impaired concentration/indecisiveness: no Suicidal ideations: no Hopelessness: no Crying spells: no Depression screen [facility] 2/9 03/28/2020 12/23/2019 11/15/2019 08/01/2019 07/19/2019  Decreased Interest 0 0  0 0 0  Down, Depressed, Hopeless 0 0 3 1 0  PHQ - 2 Score 0 0 3 1 0  Altered sleeping 3 3 3 3 3   Tired, decreased energy 3 3 2 3 3   Change in appetite 1 0 0 0 0  Feeling bad or failure about yourself  0 0 0 0 0  Trouble concentrating 1 3 1  0 0  Moving slowly or fidgety/restless 0 0 0 0 0   Suicidal thoughts 0 0 0 0 0  PHQ-9 Score 8 9 9 7 6   Difficult doing work/chores Not difficult at all Not difficult at all Somewhat difficult - Not difficult at all  Some recent data might be hidden    Relevant past medical, surgical, family and social history reviewed and updated as indicated. Interim medical history since our last visit reviewed. Allergies and medications reviewed and updated.  Review of Systems  Constitutional: Negative for activity change, appetite change, diaphoresis, fatigue and fever.  Respiratory: Negative for cough, chest tightness and shortness of breath.   Cardiovascular: Negative for chest pain, palpitations and leg swelling.  Gastrointestinal: Negative.   Endocrine: Negative for heat intolerance, polydipsia, polyphagia and polyuria.  Neurological: Negative.   Psychiatric/Behavioral: Negative.     Per HPI unless specifically indicated above     Objective:    BP 123/67   Pulse 95   Temp 99.1 F (37.3 C) (Oral)   Wt 138 lb 6.4 oz (62.8 kg)   LMP  (LMP Unknown)   SpO2 98%   BMI 28.43 kg/m   Wt Readings from Last 3 Encounters:  03/28/20 138 lb 6.4 oz (62.8 kg)  12/30/19 137 lb (62.1 kg)  12/23/19 140 lb (63.5 kg)    Physical Exam Vitals and nursing note reviewed.  Constitutional:      General: She is awake. She is not in acute distress.    Appearance: She is well-developed and well-groomed. She is not ill-appearing.  HENT:     Head: Normocephalic.     Right Ear: Hearing normal.     Left Ear: Hearing normal.  Eyes:     General: Lids are normal.        Right eye: No discharge.        Left eye: No discharge.     Conjunctiva/sclera: Conjunctivae normal.     Pupils: Pupils are equal, round, and reactive to light.  Neck:     Thyroid: No thyromegaly.     Vascular: No carotid bruit.  Cardiovascular:     Rate and Rhythm: Normal rate and regular rhythm.     Heart sounds: Normal heart sounds. No murmur heard.  No gallop.   Pulmonary:      Effort: Pulmonary effort is normal. No accessory muscle usage or respiratory distress.     Breath sounds: Normal breath sounds.     Comments: Wearing O2 by [place] at 2 L.  Talkative without SOB.   Abdominal:     General: Bowel sounds are normal.     Palpations: Abdomen is soft.  Musculoskeletal:     Cervical back: Normal range of motion and neck supple.     Right lower leg: No edema.     Left lower leg: No edema.  Lymphadenopathy:     Cervical: No cervical adenopathy.  Skin:    General: Skin is warm and dry.  Neurological:     Mental Status: She is alert and oriented to person, place, and time.  Psychiatric:  Attention and Perception: Attention normal.        Mood and Affect: Mood normal.        Speech: Speech normal.        Behavior: Behavior normal. Behavior is cooperative.        Thought Content: Thought content normal.     Results for orders placed or performed in visit on 03/16/20  HM DIABETES EYE EXAM  Result Value Ref Range   HM Diabetic Eye Exam No Retinopathy No Retinopathy      Assessment & Plan:   Problem List Items Addressed This Visit      Cardiovascular and Mediastinum   Hypertension associated with diabetes ([place])    Chronic, stable with BP below goal on visit today and on home readings.  Recommend she continue to monitor BP at home regularly.  Focus on DASH diet.  Continue current medication regimen and adjust as needed.  BMP today.  Return in 3 months.       Relevant Medications   Dulaglutide (TRULICITY) 3 LD/3.5TS SOPN   Other Relevant Orders   Basic metabolic panel   Bayer DCA Hb A1c Waived   Acute pulmonary embolism (HCC)    Acute, currently being followed by pulmonary.  Continue Eliquis as prescribed by them, on review to continue for 6 months (November).  Continue continuous O2.        Respiratory   COPD, moderate (HCC)    Chronic, ongoing.  Continue Trelegy which offers benefit of medication minimization and has improved symptoms overall.   Continue to collaborate with [name] and review notes.          Endocrine   Hyperlipidemia associated with type 2 diabetes mellitus (HCC)    Chronic, stable.  Continue current medication regimen and adjust as needed.  Lipid panel today.      Relevant Medications   Dulaglutide (TRULICITY) 3 VX/7.9TJ SOPN   Other Relevant Orders   Bayer DCA Hb A1c Waived   Lipid Panel w/o Chol/HDL Ratio   Type 2 diabetes mellitus with diabetic neuropathy, unspecified (HCC) - Primary    Chronic, ongoing, insulin dependent. A1C 7.3% today.  Reassured patient.  Will continue Trulicity at current dose, increase Tresiba to 3 MG weekly (sent script), and Metformin continue current dose at this time.  Recommend she continue to monitor BS consistently at home and document + focus heavily on diet changes.  Could consider increase in Trulicity next visit if elevation noted, 4.5 MG weekly.  Return in 3 months.       Relevant Medications   Dulaglutide (TRULICITY) 3 QZ/0.0PQ SOPN   Other Relevant Orders   Bayer DCA Hb A1c Waived     Other   Obesity    Recommended eating smaller high protein, low fat meals more frequently and exercising 30 mins a day 5 times a week with a goal of 10-15lb weight loss in the next 3 months. Patient voiced their understanding and motivation to adhere to these recommendations.       Relevant Medications   Dulaglutide (TRULICITY) 3 ZR/0.0TM SOPN   Depression, major, single episode, in partial remission (HCC)    Chronic, stable.  Denies SI/HI.  Mood well-controlled.  Continue current medication regimen and adjust as needed.  Refills as needed.  Consider trial of Duloxetine and transition off Effexor for dual pain and mood benefit.      B12 deficiency    Check B12 level today and continue injections.      Relevant Orders  Vitamin B12   Chronic pain syndrome    Continue to collaborate with chronic pain clinic in [facility].  Has morphine pump.        Relevant  Medications   HYDROcodone-acetaminophen (NORCO/VICODIN) 5-325 MG tablet       Follow up plan: Return in about 3 months (around 06/28/2020) for T2DM, HTN., HLD, PE, Pain, MOOD.

## 2020-03-28 NOTE — Assessment & Plan Note (Signed)
Acute, currently being followed by pulmonary.  Continue Eliquis as prescribed by them, on review to continue for 6 months (November).  Continue continuous O2.

## 2020-03-28 NOTE — Assessment & Plan Note (Signed)
Chronic, ongoing, insulin dependent. A1C 7.3% today.  Reassured patient.  Will continue Trulicity at current dose, increase Tresiba to 3 MG weekly (sent script), and Metformin continue current dose at this time.  Recommend she continue to monitor BS consistently at home and document + focus heavily on diet changes.  Could consider increase in Trulicity next visit if elevation noted, 4.5 MG weekly.  Return in 3 months.

## 2020-03-28 NOTE — Assessment & Plan Note (Signed)
Chronic, stable with BP below goal on visit today and on home readings.  Recommend she continue to monitor BP at home regularly.  Focus on DASH diet.  Continue current medication regimen and adjust as needed.  BMP today.  Return in 3 months.

## 2020-03-28 NOTE — Patient Instructions (Signed)

## 2020-03-29 LAB — BASIC METABOLIC PANEL
BUN/Creatinine Ratio: 15 (ref 12–28)
BUN: 11 mg/dL (ref 8–27)
CO2: 27 mmol/L (ref 20–29)
Calcium: 9.2 mg/dL (ref 8.7–10.3)
Chloride: 99 mmol/L (ref 96–106)
Creatinine, Ser: 0.71 mg/dL (ref 0.57–1.00)
GFR calc Af Amer: 100 mL/min/{1.73_m2} (ref >59)
GFR calc non Af Amer: 87 mL/min/{1.73_m2} (ref >59)
Glucose: 110 mg/dL — ABNORMAL HIGH (ref 65–99)
Potassium: 5 mmol/L (ref 3.5–5.2)
Sodium: 138 mmol/L (ref 134–144)

## 2020-03-29 LAB — VITAMIN B12: Vitamin B-12: 1329 pg/mL — ABNORMAL HIGH (ref 232–1245)

## 2020-03-29 LAB — LIPID PANEL W/O CHOL/HDL RATIO
Cholesterol, Total: 99 mg/dL — ABNORMAL LOW (ref 100–199)
HDL: 55 mg/dL (ref >39)
LDL Chol Calc (NIH): 29 mg/dL (ref 0–99)
Triglycerides: 67 mg/dL (ref 0–149)
VLDL Cholesterol Cal: 15 mg/dL (ref 5–40)

## 2020-03-29 NOTE — Progress Notes (Signed)
Please let Jordan Chan know all of her labs continue to look great, including kidney function, cholesterol, and B12 level!!  Keep up the good work and I love you!! [employer]

## 2020-03-31 ENCOUNTER — Emergency Department
Admission: EM | Admit: 2020-03-31 | Discharge: 2020-03-31 | Disposition: A | Discharge status: Home / Self Care | Payer: Medicare HMO | Attending: Emergency Medicine | Admitting: Emergency Medicine

## 2020-03-31 ENCOUNTER — Emergency Department: Payer: Medicare HMO

## 2020-03-31 ENCOUNTER — Other Ambulatory Visit: Payer: Self-pay

## 2020-03-31 DIAGNOSIS — R110 Nausea: Secondary | ICD-10-CM | POA: Insufficient documentation

## 2020-03-31 DIAGNOSIS — M549 Dorsalgia, unspecified: Secondary | ICD-10-CM | POA: Insufficient documentation

## 2020-03-31 DIAGNOSIS — R61 Generalized hyperhidrosis: Secondary | ICD-10-CM | POA: Diagnosis not present

## 2020-03-31 DIAGNOSIS — I959 Hypotension, unspecified: Secondary | ICD-10-CM | POA: Diagnosis not present

## 2020-03-31 DIAGNOSIS — Z7901 Long term (current) use of anticoagulants: Secondary | ICD-10-CM | POA: Insufficient documentation

## 2020-03-31 DIAGNOSIS — R0902 Hypoxemia: Secondary | ICD-10-CM | POA: Diagnosis not present

## 2020-03-31 DIAGNOSIS — R0602 Shortness of breath: Secondary | ICD-10-CM | POA: Diagnosis present

## 2020-03-31 DIAGNOSIS — J449 Chronic obstructive pulmonary disease, unspecified: Secondary | ICD-10-CM | POA: Diagnosis not present

## 2020-03-31 DIAGNOSIS — Z5321 Procedure and treatment not carried out due to patient leaving prior to being seen by health care provider: Secondary | ICD-10-CM | POA: Insufficient documentation

## 2020-03-31 DIAGNOSIS — E119 Type 2 diabetes mellitus without complications: Secondary | ICD-10-CM | POA: Diagnosis not present

## 2020-03-31 DIAGNOSIS — R05 Cough: Secondary | ICD-10-CM | POA: Diagnosis not present

## 2020-03-31 DIAGNOSIS — R069 Unspecified abnormalities of breathing: Secondary | ICD-10-CM | POA: Diagnosis not present

## 2020-03-31 DIAGNOSIS — G8929 Other chronic pain: Secondary | ICD-10-CM | POA: Insufficient documentation

## 2020-03-31 DIAGNOSIS — J9811 Atelectasis: Secondary | ICD-10-CM | POA: Diagnosis not present

## 2020-03-31 LAB — CBC
HCT: 38.6 % (ref 36.0–46.0)
Hemoglobin: 12.2 g/dL (ref 12.0–15.0)
MCH: 28.4 pg (ref 26.0–34.0)
MCHC: 31.6 g/dL (ref 30.0–36.0)
MCV: 90 fL (ref 80.0–100.0)
Platelets: 355 10*3/uL (ref 150–400)
RBC: 4.29 MIL/uL (ref 3.87–5.11)
RDW: 14.3 % (ref 11.5–15.5)
WBC: 17.6 10*3/uL — ABNORMAL HIGH (ref 4.0–10.5)
nRBC: 0 % (ref 0.0–0.2)

## 2020-03-31 LAB — BASIC METABOLIC PANEL
Anion gap: 7 (ref 5–15)
BUN: 13 mg/dL (ref 8–23)
CO2: 29 mmol/L (ref 22–32)
Calcium: 9.2 mg/dL (ref 8.9–10.3)
Chloride: 100 mmol/L (ref 98–111)
Creatinine, Ser: 0.7 mg/dL (ref 0.44–1.00)
GFR calc Af Amer: >60 mL/min (ref >60)
GFR calc non Af Amer: >60 mL/min (ref >60)
Glucose, Bld: 133 mg/dL — ABNORMAL HIGH (ref 70–99)
Potassium: 4.6 mmol/L (ref 3.5–5.1)
Sodium: 136 mmol/L (ref 135–145)

## 2020-03-31 MED ORDER — ONDANSETRON 4 MG PO TBDP
4.0000 mg | ORAL_TABLET | Freq: Once | ORAL | Status: AC | PRN
  Administered 2020-03-31: 4 mg via ORAL
  Filled 2020-03-31: qty 1

## 2020-03-31 MED ORDER — ACETAMINOPHEN 325 MG PO TABS
650.0000 mg | ORAL_TABLET | Freq: Once | ORAL | Status: AC | PRN
  Administered 2020-03-31: 650 mg via ORAL
  Filled 2020-03-31: qty 2

## 2020-03-31 NOTE — ED Notes (Signed)
Pt informed this RN that she felt like her glucose was dropping. Pt states that she is diabetic and has not ate today. Pt taken to triage room and CBG checked, Pts CBG 100. Pt request OJ to drink. OK per Dr. Quentin Cornwall. Pt vital signs updated and O2 tank switched out for full tank, pt declines warm blanket at this time

## 2020-03-31 NOTE — ED Notes (Signed)
Pt states that she is going to go home and try to get an appointment with the walk in clinic tomorrow.

## 2020-03-31 NOTE — ED Triage Notes (Signed)
Pt arrived [facility] from home with reports of shortness of breath that became worse last night, pt states she hasn't felt good all week, states she has had a cough that is productive at times with thick green sputum. And also c/o nausea with poor PO intake.  Pt states she was dx with PE in [facility] lung in May, currently on Eliquis. Pt wears 2L [place] continuous since dx with blood clots.  Pt has chronic back pain and has implanted morphine pump in place.

## 2020-04-01 LAB — GLUCOSE, CAPILLARY: Glucose-Capillary: 100 mg/dL — ABNORMAL HIGH (ref 70–99)

## 2020-04-02 ENCOUNTER — Ambulatory Visit: Payer: Self-pay | Admitting: *Deleted

## 2020-04-02 ENCOUNTER — Telehealth (INDEPENDENT_AMBULATORY_CARE_PROVIDER_SITE_OTHER): Payer: Medicare HMO | Admitting: Family Medicine

## 2020-04-02 ENCOUNTER — Encounter: Payer: Self-pay | Admitting: Family Medicine

## 2020-04-02 ENCOUNTER — Telehealth: Payer: Self-pay | Admitting: Nurse Practitioner

## 2020-04-02 DIAGNOSIS — Z20822 Contact with and (suspected) exposure to covid-19: Secondary | ICD-10-CM

## 2020-04-02 MED ORDER — BENZONATATE 200 MG PO CAPS: 200.0000 mg | ORAL_CAPSULE | Freq: Two times a day (BID) | ORAL | 0 refills | Status: DC | PRN

## 2020-04-02 MED ORDER — PREDNISONE 10 MG PO TABS
ORAL_TABLET | ORAL | 0 refills | Status: DC
Start: 2020-04-02 — End: 2020-07-04

## 2020-04-02 MED ORDER — PREDNISONE 10 MG PO TABS: ORAL_TABLET | ORAL | 0 refills | Status: DC

## 2020-04-02 NOTE — Telephone Encounter (Signed)
Patient reports she has been sick for several days. Coughing and having trouble eating- not hungry. 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 Patient went to UC and they would not see her due to her breathing issues.Patient was at the ED for over 4 hours- but she left due to the long wait. Patient does not have a meter to check her O2 sat at home- she lost it. Patient has not been checked for COVID. Patient states she will not go back to the hospital. Call to office- virtual visit schedule for this afternoon. Patient advised if she gets worse- call 911.   Reason for Disposition  MILD difficulty breathing (e.g., minimal/no SOB at rest, SOB with walking, pulse <100)  Answer Assessment - Initial Assessment Questions 1. COVID-19 DIAGNOSIS: "Who made your Coronavirus (COVID-19) diagnosis?" "Was it confirmed by a positive lab test?" If not diagnosed by a HCP, ask "Are there lots of cases (community spread) where you live?" (See public health department website, if unsure)     Patient has exposure 2. COVID-19 EXPOSURE: "Was there any known exposure to COVID before the symptoms began?" CDC Definition of close contact: within 6 feet (2 meters) for a total of 15 minutes or more over a 24-hour period.      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 ONSET: "When did the COVID-19 symptoms start?"      1 week 4. WORST SYMPTOM: "What is your worst symptom?" (e.g., cough, fever, shortness of breath, muscle aches)     Feels horrible- cough 5. COUGH: "Do you have a cough?" If Yes, ask: "How bad is the cough?"       Yes- coughing frequently- thick green sputum 6. FEVER: "Do you have a fever?" If Yes, ask: "What is your temperature, how was it measured, and when did it start?"     no 7. RESPIRATORY STATUS: "Describe your breathing?" (e.g., shortness of breath, wheezing, unable to speak)      Using O2 and feels she is  struggling-  a little more to breath.  8. BETTER-SAME-WORSE: "Are you getting better, staying the same or getting worse compared to yesterday?"  If getting worse, ask, "In what way?"     same 9. HIGH RISK DISEASE: "Do you have any chronic medical problems?" (e.g., asthma, heart or lung disease, weak immune system, obesity, etc.)     Yes- COPD 10. PREGNANCY: "Is there any chance you are pregnant?" "When was your last menstrual period?"       n/a 11. OTHER SYMPTOMS: "Do you have any other symptoms?"  (e.g., chills, fatigue, headache, loss of smell or taste, muscle pain, sore throat; new loss of smell or taste especially support the diagnosis of COVID-19)       Headache,fatigue,muscle pain  Protocols used: CORONAVIRUS (COVID-19) DIAGNOSED OR SUSPECTED-A-[facility]

## 2020-04-02 NOTE — Telephone Encounter (Signed)
Noted, scheduled for visit this afternoon.  High risk patient due to recent pneumonia and PE -- O2 dependent at this time.

## 2020-04-02 NOTE — Progress Notes (Signed)
LMP  (LMP Unknown)    Subjective:    [name], female    DOB: 1950/12/09, 69 y.o.   MRN: [id number]  HPI: Jordan Chan is a 69 y.o. female  Chief Complaint  Patient presents with  . Breathing Problem  . Cough    dark yellow thick when productive.   . Nausea  . Emesis  . Headache  . Generalized Body Aches  . Anorexia  . chest heaviness   UPPER RESPIRATORY TRACT INFECTION Duration: 4-5 days Worst symptom: cough, congestion, SOB Fever: no Cough: yes Shortness of breath: yes Wheezing: yes Chest pain: yes Chest tightness: yes Chest congestion: yes Nasal congestion: yes Runny nose: yes Post nasal drip: yes Sneezing: no Sore throat: yes Swollen glands: no Sinus pressure: no Headache: yes Face pain: no Toothache: no Ear pain: no  Ear pressure: no  Eyes red/itching:no Eye drainage/crusting: no  Vomiting: no Rash: no Fatigue: yes Sick contacts: yes- ate at [employer], and went to church Strep contacts: no  Context: worse Recurrent sinusitis: no Relief with OTC cold/cough medications: no  Treatments attempted: none   Relevant past medical, surgical, family and social history reviewed and updated as indicated. Interim medical history since our last visit reviewed. Allergies and medications reviewed and updated.  Review of Systems  Constitutional: Positive for chills, diaphoresis and fatigue. Negative for activity change, appetite change, fever and unexpected weight change.  HENT: Positive for congestion, postnasal drip and rhinorrhea. Negative for dental problem, drooling, ear discharge, ear pain, facial swelling, hearing loss, mouth sores, nosebleeds, sinus pressure, sinus pain, sneezing, sore throat, tinnitus, trouble swallowing and voice change.   Respiratory: Positive for cough, chest tightness, shortness of breath and wheezing. Negative for apnea, choking and stridor.   Cardiovascular: Negative.   Gastrointestinal: Negative.     Musculoskeletal: Positive for arthralgias and myalgias. Negative for back pain, gait problem, joint swelling, neck pain and neck stiffness.  Neurological: Negative.   Psychiatric/Behavioral: Negative.     Per HPI unless specifically indicated above     Objective:    LMP  (LMP Unknown)   Wt Readings from Last 3 Encounters:  03/28/20 138 lb 6.4 oz (62.8 kg)  12/30/19 137 lb (62.1 kg)  12/23/19 140 lb (63.5 kg)    Physical Exam Vitals and nursing note reviewed.  Constitutional:      General: She is not in acute distress.    Appearance: Normal appearance. She is not ill-appearing, toxic-appearing or diaphoretic.  HENT:     Head: Normocephalic and atraumatic.     Right Ear: External ear normal.     Left Ear: External ear normal.     Nose: Nose normal.     Mouth/Throat:     Mouth: Mucous membranes are moist.     Pharynx: Oropharynx is clear.  Eyes:     General: No scleral icterus.       Right eye: No discharge.        Left eye: No discharge.     Conjunctiva/sclera: Conjunctivae normal.     Pupils: Pupils are equal, round, and reactive to light.  Pulmonary:     Effort: Pulmonary effort is normal. No respiratory distress.     Comments: Speaking in full sentences, barking cough, on 2 L oxygen Musculoskeletal:        General: Normal range of motion.     Cervical back: Normal range of motion.  Skin:    Coloration: Skin is not jaundiced or pale.  Findings: No bruising, erythema, lesion or rash.  Neurological:     Mental Status: She is alert and oriented to person, place, and time. Mental status is at baseline.  Psychiatric:        Mood and Affect: Mood normal.        Behavior: Behavior normal.        Thought Content: Thought content normal.        Judgment: Judgment normal.     Results for orders placed or performed in visit on 16/10/96  Basic metabolic panel  Result Value Ref Range   Glucose 110 (H) 65 - 99 mg/dL   BUN 11 8 - 27 mg/dL   Creatinine, Ser 0.71 0.57 -  1.00 mg/dL   GFR calc non Af Amer 87 >59 mL/min/1.73   GFR calc Af Amer 100 >59 mL/min/1.73   BUN/Creatinine Ratio 15 12 - 28   Sodium 138 134 - 144 mmol/L   Potassium 5.0 3.5 - 5.2 mmol/L   Chloride 99 96 - 106 mmol/L   CO2 27 20 - 29 mmol/L   Calcium 9.2 8.7 - 10.3 mg/dL  Bayer DCA Hb A1c Waived  Result Value Ref Range   HB A1C (BAYER DCA - WAIVED) 7.3 (H) <7.0 %  Lipid Panel w/o Chol/HDL Ratio  Result Value Ref Range   Cholesterol, Total 99 (L) 100 - 199 mg/dL   Triglycerides 67 0 - 149 mg/dL   HDL 55 >39 mg/dL   VLDL Cholesterol Cal 15 5 - 40 mg/dL   LDL Chol Calc (NIH) 29 0 - 99 mg/dL  Vitamin B12  Result Value Ref Range   Vitamin B-12 1,329 (H) 232 - 1,245 pg/mL      Assessment & Plan:   Problem List Items Addressed This Visit    None    Visit Diagnoses    Suspected COVID-19 virus infection    -  Primary   Will get her swabbed ASAP. Start prednisone and tessalon. Warning signs to go to ER discussed. Follow up with virtual visit in 1-2 days.    Relevant Orders   Novel Coronavirus, NAA ([facility])       Follow up plan: Return 2-3 days VIRTUAL.   . This visit was completed via MyChart due to the restrictions of the COVID-19 pandemic. All issues as above were discussed and addressed. Physical exam was done as above through visual confirmation on MyChart. If it was felt that the patient should be evaluated in the office, they were directed there. The patient verbally consented to this visit. . Location of the patient: home . Location of the provider: work . Those involved with this call:  . Provider: Park Liter, DO . CMA: Lauretta Grill, RMA . Front Desk/Registration: Don Perking  . Time spent on call: 15 minutes with patient face to face via video conference. More than 50% of this time was spent in counseling and coordination of care. 23 minutes total spent in review of patient's record and preparation of their chart.

## 2020-04-02 NOTE — Telephone Encounter (Signed)
-----   Message from Valerie Roys, [place] sent at 04/02/2020  3:20 PM EDT ----- Wednesday or THursday  with Henrine Screws

## 2020-04-02 NOTE — Telephone Encounter (Signed)
lvm to make this virtual apt.

## 2020-04-04 ENCOUNTER — Other Ambulatory Visit: Payer: Self-pay

## 2020-04-04 ENCOUNTER — Other Ambulatory Visit: Payer: Self-pay | Admitting: Nurse Practitioner

## 2020-04-05 DIAGNOSIS — J45909 Unspecified asthma, uncomplicated: Secondary | ICD-10-CM | POA: Diagnosis not present

## 2020-04-05 DIAGNOSIS — J449 Chronic obstructive pulmonary disease, unspecified: Secondary | ICD-10-CM | POA: Diagnosis not present

## 2020-04-11 DIAGNOSIS — Z9981 Dependence on supplemental oxygen: Secondary | ICD-10-CM | POA: Diagnosis not present

## 2020-04-11 DIAGNOSIS — J9621 Acute and chronic respiratory failure with hypoxia: Secondary | ICD-10-CM | POA: Diagnosis not present

## 2020-04-11 DIAGNOSIS — R0600 Dyspnea, unspecified: Secondary | ICD-10-CM | POA: Diagnosis not present

## 2020-04-11 DIAGNOSIS — J449 Chronic obstructive pulmonary disease, unspecified: Secondary | ICD-10-CM | POA: Diagnosis not present

## 2020-04-11 DIAGNOSIS — R05 Cough: Secondary | ICD-10-CM | POA: Diagnosis not present

## 2020-04-12 ENCOUNTER — Other Ambulatory Visit: Payer: Self-pay | Admitting: Nurse Practitioner

## 2020-04-18 ENCOUNTER — Ambulatory Visit: Payer: Self-pay | Admitting: Pharmacist

## 2020-04-18 DIAGNOSIS — Z794 Long term (current) use of insulin: Secondary | ICD-10-CM

## 2020-04-18 DIAGNOSIS — J449 Chronic obstructive pulmonary disease, unspecified: Secondary | ICD-10-CM

## 2020-04-18 DIAGNOSIS — E1140 Type 2 diabetes mellitus with diabetic neuropathy, unspecified: Secondary | ICD-10-CM

## 2020-04-18 NOTE — Patient Instructions (Addendum)
Visit information:  Jordan Chan,   It was a pleasure speaking with you today. Thank you for letting me be part of your clinical team. Please call me with any questions or concerns.      Goals Addressed              This Visit's Progress   .  "I want to get my blood sugars under control" (pt-stated)        CARE PLAN ENTRY (see longtitudinal plan of care for additional care plan information)  Current Barriers:  . Diabetes: uncontrolled, most recent A1c 7.3% (03/28/20) o Patient reports she is feeling much improved and cough is tolerable. She is frustrated with wearing oxygen 24/7 and looking forward to "getting rid of these blood clots"  Completed course of prednisone. Reports her glucose readings are now back to normal. She is slightly disappointed at her recent A1C reading of 7.3% (up from 7.0% in April) and reports she tries very hard to keep her blood sugar controlled. o Denies any episodes of hypoglycemia and states she becomes symptomatic with weakness, nausea, sweating and dizziness when her BG reaches 105-110. Reports a  history of passing out with a BG level of 50.  [employer] representative called her yesterday stating they will be sending a faxed request for new glucometer and supplies to this office for approval from PCP. Her Acuc[place]k meter and supplies will no longer be available. I checked the chart but do not see documentation that we have received request. . Current antihyperglycemic regimen: Trulicity 3.0 mg, metformin 1000 mg BID, Tresiba 20-25 units daily (adjusts based on BG to avoid hypoglycemia) . Current glucose readings:  o Reports sugars have improved now that she is off prednisone. o Fasting: 120-140s o 2 hour after meals: ~150-180s, sometimes 230-250s . Cardiovascular risk reduction: o Current hypertensive regimen: Metoprolol succinate 12.5mg  daily home BP readings have been "good" she remembers one reading from Sunday 132/68. She checks BP 3-4 times weekly. o Current  hyperlipidemia regimen: atorvastatin 20 mg daily, LDL at goal <70;  o Current antiplatelet regimen: none right now d/t Eliquis . COPD; Trelegy QAM, albuterol PRN (has not needed a dose in 1-2 months); O2  continuous o Patient reports Trelegy as very effective.  She is recovering from recent bronchitis and endorsed baseline lack of energy abd some diarrhea with Augmentin prescribed by Pulmonogy on 04/13/20. She is halfway through her 10 day course.  [name] Hx PE: Eliquis 5 mg BID per Dr. Raul Chan. Will re-evaluate after 6 months tx.(Next appt 07/04/20) Denies any s/sx bleeding/brusing . Chronic pain: follows w/ pain management; gabapentin 800 mg TID, cyclobenzaprine 5 mg BID PRN. Intrathecal morphine pump. . Mood: venlafaxine XR 150 mg QAM, reports feeling well controlled. Became tearful when recounting sitting with her father at the end of his life 24 years ago. She recalls only sleeping about 4 hours during those two weeks and believes this to be the root of her continued insomnia. States she has previously talked with counselors about this experience. She reports she retried melatonin for ~ 2/5 weeks and stopped due to ineffectiveness. She is no longer taking quetiapine as it did not work for her. She reports sleeping 2-3 hours most nights and she considers this adequate for her.   Pharmacist Clinical Goal(s):  [name] Over the next 90 days, patient with work with PharmD and primary care provider to address optimized diabetes management  Interventions: . Comprehensive medication review performed, medication list updated in electronic medical record .  Inter-disciplinary care team collaboration (see longitudinal plan of care) . Provided empathetic listening. Recommended she continue working with LCSW for mental health support including relaxation and sleep hygiene   . Discussed diet and physical activity. Praised patient for cutting back from > 1 gallon to one glass of sweet tea daily. She tells me she has started  adding "a little" unsweetened tea to her daily glass. She eats lunch at [employer] every day which usually consists of veggies and a lean protein.  . Reassured patient. Discussed most recent A1c reflects 3 month period during which she had many stressors.  . Discussed Tyler Aas having greater effect on fasting BG while Trulicity effects both fasting and postprandial. . Encouraged patient to increase fluid intake and consider taking a probiotic or increasing intake of fermented foods including yogurt, sauerkraut or kefir to help with antibiotic associated diarrhea.  Patient Self Care Activities:  . Patient will check blood glucose BID, document, and provide at future appointments . Patient will report any questions or concerns to provider  . Patient will continue monitoring blood pressure 3-4 times weekly, document and provide at future appointments.  Please see past updates related to this goal by clicking on the "Past Updates" button in the selected goal         The patient verbalized understanding of instructions provided today and agreed to receive a mailed copy of patient instruction and/or educational materials.  Telephone follow up appointment with pharmacy team member scheduled for: 07/09/20 at 10:30  Jordan Chan. Kenton Kingfisher PharmD, BCPS Clinical Pharmacist [phone]  Diabetes Mellitus and Sick Day Management Blood sugar (glucose) can be difficult to control when you are sick. Common illnesses that can cause problems for people with diabetes (diabetes mellitus) include colds, fever, flu (influenza), nausea, vomiting, and diarrhea. These illnesses can cause stress and loss of body fluids (dehydration), and those issues can cause blood glucose levels to increase. Because of this, it is very important to take your insulin and diabetes medicines and eat some form of carbohydrate when you are sick. You should make a plan for days when you are sick (sick day plan) as part of your diabetes  management plan. You and your health care provider should make this plan in advance. The following guidelines are intended to help you manage an illness that lasts for about 24 hours or less. Your health care provider may also give you more specific instructions. What do I need to do to manage my blood glucose?   Check your blood glucose every 2-4 hours, or as often as told by your health care provider.  Know your sick day treatment goals. Your target blood glucose levels may be different when you are sick.  If you use insulin, take your usual dose. ? If your blood glucose continues to be too high, you may need to take an additional insulin dose as told by your health care provider.  If you use oral diabetes medicine, you may need to stop taking it if you are not able to eat or drink normally. Ask your health care provider about whether you need to stop taking these medicines while you are sick.  If you use injectable hormone medicines other than insulin to control your diabetes, ask your health care provider about whether you need to stop taking these medicines while you are sick. What else can I do to manage my diabetes when I am sick? Check your ketones  If you have type 1 diabetes, check your urine  ketones every 4 hours.  If you have type 2 diabetes, check your urine ketones as often as told by your health care provider. Drink fluids  Drink enough fluid to keep your urine clear or pale yellow. This is especially important if you have a fever, vomiting, or diarrhea. Those symptoms can lead to dehydration.  Follow any instructions from your health care provider about beverages to avoid. ? Do not drink alcohol, caffeine, or drinks that contain a lot of sugar. Take medicines as directed  Take-over-the-counter and prescription medicines only as told by your health care provider.  Check medicine labels for added sugars. Some medicines may contain sugar or types of sugars that can raise  your blood glucose level. What foods can I eat when I am sick?  You need to eat some form of carbohydrates when you are sick. You should eat 45-50 grams (45-50 g) of carbohydrates every 3-4 hours until you feel better. All of the food choices below contain about 15 g of carbohydrates. Plan ahead and keep some of these foods around so you have them if you get sick.  4-6 oz (120-177 mL) carbonated beverage that contains sugar, such as regular (not diet) soda. You may be able to drink carbonated beverages more easily if you open the beverage and let it sit at room temperature for a few minutes before drinking.   of a twin frozen ice pop.  4 oz (120 g) regular gelatin.  4 oz (120 mL) fruit juice.  4 oz (120 g) ice cream or frozen yogurt.  2 oz (60 g) sherbet.  8 oz (240 mL) clear broth or soup.  4 oz (120 g) regular custard.  4 oz (120 g) regular pudding.  8 oz (240 g) plain yogurt.  1 slice bread or toast.  6 saltine crackers.  5 vanilla wafers. Questions to ask your health care provider Consider asking the following questions so you know what to do on days when you are sick:  Should I adjust my diabetes medicines?  How often do I need to check my blood glucose?  What supplies do I need to manage my diabetes at home when I am sick?  What number can I call if I have questions?  What foods and drinks should I avoid? Contact a health care provider if:  You develop symptoms of diabetic ketoacidosis, such as: ? Fatigue. ? Weight loss. ? Excessive thirst. ? Light-headedness. ? Fruity or sweet-smelling breath. ? Excessive urination. ? Vision changes. ? Confusion or irritability. ? Nausea. ? Vomiting. ? Rapid breathing. ? Pain in the abdomen. ? Feeling flushed.  You are unable to drink fluids without vomiting.  You have any of the following for more than 6 hours: ? Nausea. ? Vomiting. ? Diarrhea.  Your blood glucose is at or above 240 mg/dL (13.3 mmol/L), even  after you take an additional insulin dose.  You have a change in how you think, feel, or act (mental status).  You develop another serious illness.  You have been sick or have had a fever for 2 days or longer and you are not getting better. Get help right away if:  Your blood glucose is lower than 54 mg/dL (3.0 mmol/L).  You have difficulty breathing.  You have moderate or high ketone levels in your urine.  You used emergency glucagon to treat low blood glucose. Summary  Blood sugar (glucose) can be difficult to control when you are sick. Common illnesses that can cause problems for  people with diabetes (diabetes mellitus) include colds, fever, flu (influenza), nausea, vomiting, and diarrhea.  Illnesses can cause stress and loss of body fluids (dehydration), and those issues can cause blood glucose levels to increase.  Make a plan for days when you are sick (sick day plan) as part of your diabetes management plan. You and your health care provider should make this plan in advance.  It is very important to take your insulin and diabetes medicines and to eat some form of carbohydrate when you are sick.  Contact your health care provider if have problems managing your blood glucose levels when you are sick, or if you have been sick or had a fever for 2 days or longer and are not getting better. This information is not intended to replace advice given to you by your health care provider. Make sure you discuss any questions you have with your health care provider. Document Revised: 04/18/2016 Document Reviewed: 04/18/2016 Elsevier Patient Education  [place].

## 2020-04-18 NOTE — Chronic Care Management (AMB) (Signed)
Chronic Care Management   Follow Up Note   04/18/2020 Name: Jordan Chan MRN: [id number] DOB: 05/30/1951  Referred by: [name], NP Reason for referral : Chronic Care Management (follow-up dm, hld)   Jordan Chan is a 69 y.o. year old female who is a primary care patient of [name], [name], NP. The CCM team was consulted for assistance with chronic disease management and care coordination needs.    Review of patient status, including review of consultants reports, relevant laboratory and other test results, and collaboration with appropriate care team members and the patient's provider was performed as part of comprehensive patient evaluation and provision of chronic care management services.    SDOH (Social Determinants of Health) assessments performed: Yes See Care Plan activities for detailed interventions related to SDOH)    The ASCVD Risk score Mikey Bussing DC Jr., et al., 2013) failed to calculate for the following reasons:   The patient has a prior MI or stroke diagnosis   Outpatient Encounter Medications as of 04/18/2020  Medication Sig  . ACCU-CHEK AVIVA PLUS test strip TEST THREE TIMES DAILY  . Accu-Chek Softclix Lancets lancets TEST BLOOD SUGAR THREE TIMES DAILY  . albuterol (PROVENTIL HFA;VENTOLIN HFA) 108 (90 BASE) MCG/ACT inhaler Inhale 2 puffs into the lungs every 6 (six) hours as needed for wheezing or shortness of breath.   . Alcohol Swabs (B-D SINGLE USE SWABS REGULAR) PADS USE TWICE DAILY  WITH  SUGAR  CHECKS  . apixaban (ELIQUIS) 5 MG TABS tablet Take 5 mg by mouth 2 (two) times daily.   [name] atorvastatin (LIPITOR) 20 MG tablet TAKE 1 TABLET EVERY DAY  . cholecalciferol (VITAMIN D3) 25 MCG (1000 UNIT) tablet Take 1,000 Units by mouth daily.  . Cyanocobalamin 1000 MCG/ML KIT Inject 1,000 mcg as directed every 30 (thirty) days.  . cyclobenzaprine (FLEXERIL) 5 MG tablet Take 5 mg by mouth 2 (two) times daily as needed for muscle spasms. Takes very rarely    . diclofenac (VOLTAREN) 50 MG EC tablet TAKE 1 TABLET THREE TIMES DAILY  . Dulaglutide (TRULICITY) 3 YQ/6.5HQ SOPN Inject 0.5 mLs (3 mg total) as directed once a week. (Patient taking differently: Inject 3 mg as directed once a week. Takes on Tuesdays. Currently taking 2 x 1.33m to use up supply.)  . gabapentin (NEURONTIN) 800 MG tablet Take 1 tablet (800 mg total) by mouth 3 (three) times daily.  .Marland Kitchenlactulose (CHRONULAC) 10 GM/15ML solution Take 45 mLs (30 g total) by mouth daily as needed for mild constipation or severe constipation.  .Marland Kitchenlisinopril (ZESTRIL) 5 MG tablet TAKE 1 TABLET EVERY DAY  . metFORMIN (GLUCOPHAGE) 1000 MG tablet TAKE 1 TABLET TWICE DAILY WITH MEALS  . metoprolol succinate (TOPROL-XL) 25 MG 24 hr tablet TAKE 1/2 TABLET EVERY DAY  . PAIN MANAGEMENT INTRATHECAL, IT, PUMP 1 each by Intrathecal route. Intrathecal (IT) medication:  Morphine Patient does not remember current. [facility] [place]  . TRELEGY ELLIPTA 100-62.5-25 MCG/INH AEPB INHALE 1 PUFF INTO THE LUNGS DAILY.  .Marland KitchenTRESIBA FLEXTOUCH 100 UNIT/ML FlexTouch Pen INJECT 30 UNITS INTO THE SKIN AT BEDTIME. (Patient taking differently: 20-25 Units at bedtime. )  . venlafaxine XR (EFFEXOR-XR) 150 MG 24 hr capsule Take 1 capsule (150 mg total) by mouth daily.  . benzonatate (TESSALON) 200 MG capsule Take 1 capsule (200 mg total) by mouth 2 (two) times daily as needed for cough. (Patient not taking: Reported on 04/18/2020)  .  diltiazem (CARDIZEM CD) 180 MG 24 hr capsule Take 1 capsule (180 mg total) by mouth daily. (Patient not taking: Reported on 03/28/2020)  . pantoprazole (PROTONIX) 40 MG tablet TAKE 1 TABLET TWICE DAILY  . predniSONE (DELTASONE) 10 MG tablet 6 tabs today, 5 tabs tomorrow, decrease by 1 every day until gone (Patient not taking: Reported on 04/18/2020)  . QUEtiapine (SEROQUEL) 25 MG tablet Take 1 tablet (25 mg total) by mouth at bedtime as needed (insomnia).  (Patient not taking: Reported on 04/18/2020)   No facility-administered encounter medications on file as of 04/18/2020.     Objective:   Goals Addressed              This Visit's Progress   .  "I want to get my blood sugars under control" (pt-stated)        CARE PLAN ENTRY (see longtitudinal plan of care for additional care plan information)  Current Barriers:  . Diabetes: uncontrolled, most recent A1c 7.3% (03/28/20) o Patient reports she is feeling much improved and cough is tolerable. She is frustrated with wearing oxygen 24/7 and looking forward to "getting rid of these blood clots"  Completed course of prednisone. Reports her glucose readings are now back to normal. She is slightly disappointed at her recent A1C reading of 7.3% (up from 7.0% in April) and reports she tries very hard to keep her blood sugar controlled. o Denies any episodes of hypoglycemia and states she becomes symptomatic with weakness, nausea, sweating and dizziness when her BG reaches 105-110. Reports a  history of passing out with a BG level of 50.  [employer] representative called her yesterday stating they will be sending a faxed request for new glucometer and supplies to this office for approval from PCP. Her [employer] meter and supplies will no longer be available. I checked the chart but do not see documentation that we have received request. . Current antihyperglycemic regimen: Trulicity 3.0 mg, metformin 1000 mg BID, Tresiba 20-25 units daily (adjusts based on BG to avoid hypoglycemia) . Current glucose readings:  o Reports sugars have improved now that she is off prednisone. o Fasting: 120-140s o 2 hour after meals: ~150-180s, sometimes 230-250s . Cardiovascular risk reduction: o Current hypertensive regimen: Metoprolol succinate 12.25m daily home BP readings have been "good" she remembers one reading from Sunday 132/68. She checks BP 3-4 times weekly. o Current hyperlipidemia regimen: atorvastatin 20 mg daily,  LDL at goal <70;  o Current antiplatelet regimen: none right now d/t Eliquis . COPD; Trelegy QAM, albuterol PRN (has not needed a dose in 1-2 months); O2  continuous o Patient reports Trelegy as very effective.  She is recovering from recent bronchitis and endorsed baseline lack of energy abd some diarrhea with Augmentin prescribed by Pulmonogy on 04/13/20. She is halfway through her 10 day course.  .Marland KitchenHx PE: Eliquis 5 mg BID per Dr. FRaul Del Will re-evaluate after 6 months tx.(Next appt 07/04/20) Denies any s/sx bleeding/brusing . Chronic pain: follows w/ pain management; gabapentin 800 mg TID, cyclobenzaprine 5 mg BID PRN. Intrathecal morphine pump. . Mood: venlafaxine XR 150 mg QAM, reports feeling well controlled. Became tearful when recounting sitting with her father at the end of his life 33years ago. She recalls only sleeping about 4 hours during those two weeks and believes this to be the root of her continued insomnia. States she has previously talked with counselors about this experience. She reports she retried melatonin for ~ 2/5 weeks and stopped due to ineffectiveness.  She is no longer taking quetiapine as it did not work for her. She reports sleeping 2-3 hours most nights and she considers this adequate for her.   Pharmacist Clinical Goal(s):  [name] Over the next 90 days, patient with work with PharmD and primary care provider to address optimized diabetes management  Interventions: . Comprehensive medication review performed, medication list updated in electronic medical record . Inter-disciplinary care team collaboration (see longitudinal plan of care) . Provided empathetic listening. Recommended she continue working with LCSW for mental health support including relaxation and sleep hygiene   . Discussed diet and physical activity. Praised patient for cutting back from > 1 gallon to one glass of sweet tea daily. She tells me she has started adding "a little" unsweetened tea to her daily  glass. She eats lunch at [employer] every day which usually consists of veggies and a lean protein.  . Reassured patient. Discussed most recent A1c reflects 3 month period during which she had many stressors.  . Discussed Tyler Aas having greater effect on fasting BG while Trulicity effects both fasting and postprandial. . Encouraged patient to increase fluid intake and consider taking a probiotic or increasing intake of fermented foods including yogurt, sauerkraut or kefir to help with antibiotic associated diarrhea.  Patient Self Care Activities:  . Patient will check blood glucose BID, document, and provide at future appointments . Patient will report any questions or concerns to provider  . Patient will continue monitoring blood pressure 3-4 times weekly, document and provide at future appointments.  Please see past updates related to this goal by clicking on the "Past Updates" button in the selected goal          Plan:   Telephone follow up appointment with the PharmD scheduled for: 3 months. Pharmacy team will reach out in 30 days to follow up on BG.   Junita Push. Kenton Kingfisher PharmD, [facility] [phone]

## 2020-05-05 DIAGNOSIS — J449 Chronic obstructive pulmonary disease, unspecified: Secondary | ICD-10-CM | POA: Diagnosis not present

## 2020-05-05 DIAGNOSIS — J45909 Unspecified asthma, uncomplicated: Secondary | ICD-10-CM | POA: Diagnosis not present

## 2020-05-07 ENCOUNTER — Other Ambulatory Visit: Payer: Self-pay

## 2020-05-07 ENCOUNTER — Ambulatory Visit (INDEPENDENT_AMBULATORY_CARE_PROVIDER_SITE_OTHER): Payer: Medicare HMO | Admitting: Nurse Practitioner

## 2020-05-07 DIAGNOSIS — E538 Deficiency of other specified B group vitamins: Secondary | ICD-10-CM

## 2020-05-07 MED ORDER — CYANOCOBALAMIN 1000 MCG/ML IJ SOLN
1000.0000 ug | Freq: Once | INTRAMUSCULAR | Status: AC
  Administered 2020-05-07: 1000 ug via INTRAMUSCULAR

## 2020-05-16 ENCOUNTER — Telehealth: Payer: Self-pay | Admitting: Licensed Clinical Social Worker

## 2020-05-16 ENCOUNTER — Telehealth: Payer: Self-pay

## 2020-05-16 NOTE — Telephone Encounter (Signed)
Chronic Care Management    Clinical Social Work General Follow Up Note  05/16/2020 Name: Jordan Chan MRN: [id number] DOB: September 25, 1950  Jordan Chan is a 69 y.o. year old female who is a primary care patient of [name], [name], NP. The CCM team was consulted for assistance with Mental Health Counseling and Resources.   Review of patient status, including review of consultants reports, relevant laboratory and other test results, and collaboration with appropriate care team members and the patient's provider was performed as part of comprehensive patient evaluation and provision of chronic care management services.    LCSW completed CCM outreach attempt today but was unable to reach patient successfully. A HIPPA compliant voice message was left encouraging patient to return call once available. LCSW will ask Scheduling Care Guide to reschedule CCM SW appointment with patient as well.  Outpatient Encounter Medications as of 05/16/2020  Medication Sig  . ACCU-CHEK AVIVA PLUS test strip TEST THREE TIMES DAILY  . Accu-Chek Softclix Lancets lancets TEST BLOOD SUGAR THREE TIMES DAILY  . albuterol (PROVENTIL HFA;VENTOLIN HFA) 108 (90 BASE) MCG/ACT inhaler Inhale 2 puffs into the lungs every 6 (six) hours as needed for wheezing or shortness of breath.   . Alcohol Swabs (B-D SINGLE USE SWABS REGULAR) PADS USE TWICE DAILY  WITH  SUGAR  CHECKS  . apixaban (ELIQUIS) 5 MG TABS tablet Take 5 mg by mouth 2 (two) times daily.   [name] atorvastatin (LIPITOR) 20 MG tablet TAKE 1 TABLET EVERY DAY  . benzonatate (TESSALON) 200 MG capsule Take 1 capsule (200 mg total) by mouth 2 (two) times daily as needed for cough. (Patient not taking: Reported on 04/18/2020)  . cholecalciferol (VITAMIN D3) 25 MCG (1000 UNIT) tablet Take 1,000 Units by mouth daily.  . Cyanocobalamin 1000 MCG/ML KIT Inject 1,000 mcg as directed every 30 (thirty) days.  . cyclobenzaprine (FLEXERIL) 5 MG tablet Take 5 mg by mouth 2 (two)  times daily as needed for muscle spasms. Takes very rarely  . diclofenac (VOLTAREN) 50 MG EC tablet TAKE 1 TABLET THREE TIMES DAILY  . diltiazem (CARDIZEM CD) 180 MG 24 hr capsule Take 1 capsule (180 mg total) by mouth daily. (Patient not taking: Reported on 03/28/2020)  . Dulaglutide (TRULICITY) 3 GG/8.3MO SOPN Inject 0.5 mLs (3 mg total) as directed once a week. (Patient taking differently: Inject 3 mg as directed once a week. Takes on Tuesdays. Currently taking 2 x 1.37m to use up supply.)  . gabapentin (NEURONTIN) 800 MG tablet Take 1 tablet (800 mg total) by mouth 3 (three) times daily.  .Marland Kitchenlactulose (CHRONULAC) 10 GM/15ML solution Take 45 mLs (30 g total) by mouth daily as needed for mild constipation or severe constipation.  .Marland Kitchenlisinopril (ZESTRIL) 5 MG tablet TAKE 1 TABLET EVERY DAY  . metFORMIN (GLUCOPHAGE) 1000 MG tablet TAKE 1 TABLET TWICE DAILY WITH MEALS  . metoprolol succinate (TOPROL-XL) 25 MG 24 hr tablet TAKE 1/2 TABLET EVERY DAY  . PAIN MANAGEMENT INTRATHECAL, IT, PUMP 1 each by Intrathecal route. Intrathecal (IT) medication:  Morphine Patient does not remember current. [facility]  . pantoprazole (PROTONIX) 40 MG tablet TAKE 1 TABLET TWICE DAILY  . predniSONE (DELTASONE) 10 MG tablet 6 tabs today, 5 tabs tomorrow, decrease by 1 every day until gone (Patient not taking: Reported on 04/18/2020)  . QUEtiapine (SEROQUEL) 25 MG tablet Take 1 tablet (25 mg total) by mouth at bedtime as needed (insomnia). (Patient not  taking: Reported on 04/18/2020)  . TRELEGY ELLIPTA 100-62.5-25 MCG/INH AEPB INHALE 1 PUFF INTO THE LUNGS DAILY.  [name] TRESIBA FLEXTOUCH 100 UNIT/ML FlexTouch Pen INJECT 30 UNITS INTO THE SKIN AT BEDTIME. (Patient taking differently: 20-25 Units at bedtime. )  . venlafaxine XR (EFFEXOR-XR) 150 MG 24 hr capsule Take 1 capsule (150 mg total) by mouth daily.   No facility-administered encounter medications on file as of  05/16/2020.   Follow Up Plan: [facility] will reach out to patient to reschedule appointment.   Eula Fried, BSW, MSW, [facility] Care Management [email or web address] Phone: [phone]

## 2020-05-17 ENCOUNTER — Telehealth: Payer: Self-pay | Admitting: Pharmacist

## 2020-05-17 NOTE — Chronic Care Management (AMB) (Signed)
Chronic Care Management Pharmacy Assistant   Name: Jordan Chan  MRN: [id number] DOB: 03/22/1951  Reason for Encounter: Disease State  Patient Questions:  1.  Have you seen any other providers since your last visit? No  2.  Any changes in your medicines or health? No   Recent Relevant Labs: Lab Results  Component Value Date/Time   HGBA1C 7.3 (H) 03/28/2020 09:18 AM   HGBA1C 7.1 (H) 12/12/2019 07:38 AM   HGBA1C 7.0 (H) 11/15/2019 09:38 AM   HGBA1C 7.7% 04/28/2016 12:00 AM   MICROALBUR 10 11/15/2019 09:38 AM   MICROALBUR 10 01/17/2019 08:36 AM    Kidney Function Lab Results  Component Value Date/Time   CREATININE 0.70 03/31/2020 11:50 AM   CREATININE 0.71 03/28/2020 09:19 AM   CREATININE 0.68 09/14/2013 07:42 PM   GFRNONAA >60 03/31/2020 11:50 AM   GFRNONAA >60 09/14/2013 07:42 PM   GFRAA >60 03/31/2020 11:50 AM   GFRAA >60 09/14/2013 07:42 PM    . Current antihyperglycemic regimen:  Trulicity 3.0 mg, metformin 1000 mg BID, Tresiba 20-25 units daily (adjusts based on BG to avoid hypoglycemia)  . What recent interventions/DTPs have been made to improve glycemic control:  Per visit with Birdena Crandall, CPP on 04/18/20; Discussed Tyler Aas having greater effect on fasting BG while Trulicity effects both fasting and postprandial. Patient cut back from > 1 gallon to one glass of sweet tea daily. Patient eats lunch at [employer] every day which usually consists of veggies and a lean protein. Encouraged patient to increase fluid intake and consider taking a probiotic or increasing intake of fermented foods including yogurt, sauerkraut or kefir to help with antibiotic associated diarrhea.  . Have there been any recent hospitalizations or ED visits since last visit with CPP? No   . Patient reports hypoglycemic symptoms, including weakness.  . Patient reports hyperglycemic symptoms, including blurry vision, fatigue and polyuria.   . How often are you checking your blood  sugar? 3-4 times daily, in the morning before eating or drinking and at bedtime   . What are your blood sugars ranging?  o Fasting: ranging between 118-135 o Before meals: n/a o After meals: ranging between 180-200 o Bedtime: ranging between 160-220  . During the week, how often does your blood glucose drop below 70? Patient has not experienced BG dropping below 70 since she been on the Trulicity    . Are you checking your feet daily/regularly? Patient does check her daily and on a regular basis.   Adherence Review: Is the patient currently on a STATIN medication? Yes Is the patient currently on ACE/ARB medication? Yes Does the patient have >5 day gap between last estimated fill dates? No     PCP : [name], NP  Allergies:   Allergies  Allergen Reactions  . Other Palpitations    IV steroids  . Pain Patch [Menthol] Anaphylaxis  . Fentanyl   . Avelox [Moxifloxacin Hcl In Nacl] Other (See Comments)    Upset stomach  . Doxycycline Diarrhea  . Erythromycin Nausea Only and Other (See Comments)    Can take a Z-Pak just fine Other reaction(s): Other (See Comments) Can take Z-Pak  Can take a Z-Pak just fine  . Moxifloxacin Hcl Other (See Comments)    Upset stomach Upset stomach  . Oxycontin [Oxycodone] Hives  . Ozempic [Semaglutide] Nausea Only    Medications: Outpatient Encounter Medications as of 05/17/2020  Medication Sig  . ACCU-CHEK AVIVA PLUS test strip TEST THREE  TIMES DAILY  . Accu-Chek Softclix Lancets lancets TEST BLOOD SUGAR THREE TIMES DAILY  . albuterol (PROVENTIL HFA;VENTOLIN HFA) 108 (90 BASE) MCG/ACT inhaler Inhale 2 puffs into the lungs every 6 (six) hours as needed for wheezing or shortness of breath.   . Alcohol Swabs (B-D SINGLE USE SWABS REGULAR) PADS USE TWICE DAILY  WITH  SUGAR  CHECKS  . apixaban (ELIQUIS) 5 MG TABS tablet Take 5 mg by mouth 2 (two) times daily.   [name] atorvastatin (LIPITOR) 20 MG tablet TAKE 1 TABLET EVERY DAY  . benzonatate  (TESSALON) 200 MG capsule Take 1 capsule (200 mg total) by mouth 2 (two) times daily as needed for cough. (Patient not taking: Reported on 04/18/2020)  . cholecalciferol (VITAMIN D3) 25 MCG (1000 UNIT) tablet Take 1,000 Units by mouth daily.  . Cyanocobalamin 1000 MCG/ML KIT Inject 1,000 mcg as directed every 30 (thirty) days.  . cyclobenzaprine (FLEXERIL) 5 MG tablet Take 5 mg by mouth 2 (two) times daily as needed for muscle spasms. Takes very rarely  . diclofenac (VOLTAREN) 50 MG EC tablet TAKE 1 TABLET THREE TIMES DAILY  . diltiazem (CARDIZEM CD) 180 MG 24 hr capsule Take 1 capsule (180 mg total) by mouth daily. (Patient not taking: Reported on 03/28/2020)  . Dulaglutide (TRULICITY) 3 MG/8.6PY SOPN Inject 0.5 mLs (3 mg total) as directed once a week. (Patient taking differently: Inject 3 mg as directed once a week. Takes on Tuesdays. Currently taking 2 x 1.41m to use up supply.)  . gabapentin (NEURONTIN) 800 MG tablet Take 1 tablet (800 mg total) by mouth 3 (three) times daily.  .Marland Kitchenlactulose (CHRONULAC) 10 GM/15ML solution Take 45 mLs (30 g total) by mouth daily as needed for mild constipation or severe constipation.  .Marland Kitchenlisinopril (ZESTRIL) 5 MG tablet TAKE 1 TABLET EVERY DAY  . metFORMIN (GLUCOPHAGE) 1000 MG tablet TAKE 1 TABLET TWICE DAILY WITH MEALS  . metoprolol succinate (TOPROL-XL) 25 MG 24 hr tablet TAKE 1/2 TABLET EVERY DAY  . PAIN MANAGEMENT INTRATHECAL, IT, PUMP 1 each by Intrathecal route. Intrathecal (IT) medication:  Morphine Patient does not remember current. [facility]  . pantoprazole (PROTONIX) 40 MG tablet TAKE 1 TABLET TWICE DAILY  . predniSONE (DELTASONE) 10 MG tablet 6 tabs today, 5 tabs tomorrow, decrease by 1 every day until gone (Patient not taking: Reported on 04/18/2020)  . QUEtiapine (SEROQUEL) 25 MG tablet Take 1 tablet (25 mg total) by mouth at bedtime as needed (insomnia). (Patient not taking: Reported on  04/18/2020)  . TRELEGY ELLIPTA 100-62.5-25 MCG/INH AEPB INHALE 1 PUFF INTO THE LUNGS DAILY.  .Marland KitchenTRESIBA FLEXTOUCH 100 UNIT/ML FlexTouch Pen INJECT 30 UNITS INTO THE SKIN AT BEDTIME. (Patient taking differently: 20-25 Units at bedtime. )  . venlafaxine XR (EFFEXOR-XR) 150 MG 24 hr capsule Take 1 capsule (150 mg total) by mouth daily.   No facility-administered encounter medications on file as of 05/17/2020.    Current Diagnosis: Patient Active Problem List   Diagnosis Date Noted  . Acute pulmonary embolism ([place] 12/28/2019  . Aphthous ulcer of tongue   . SVT (supraventricular tachycardia) ([facility]   . Community acquired pneumonia 12/11/2019  . Vitamin D deficiency 07/17/2019  . Stress-induced cardiomyopathy 07/23/2017  . Type 2 diabetes mellitus with diabetic neuropathy, unspecified ([place] 06/24/2017  . History of non-ST elevation myocardial infarction (NSTEMI)   . Presence of intrathecal pump 04/27/2017  . Advanced care planning/counseling discussion 11/14/2016  . Cervical scoliosis  05/20/2016  . Hypertension associated with diabetes ([place]) 06/18/2015  . B12 deficiency 03/19/2015  . Sleep apnea 11/16/2014  . Allergic rhinitis 11/16/2014  . History of breast cancer 11/16/2014  . GERD (gastroesophageal reflux disease) 11/16/2014  . COPD, moderate ([place]) 11/16/2014  . Obesity 11/16/2014  . Depression, major, single episode, in partial remission ([place]) 11/16/2014  . Insomnia 11/16/2014  . Hyperlipidemia associated with type 2 diabetes mellitus ([place]) 11/16/2014  . History of sarcoma 09/27/2013  . Chronic pain syndrome 09/30/2012    Goals Addressed              This Visit's Progress   .  "I want to get my blood sugars under control" (pt-stated)   On track     [place] (see longtitudinal plan of care for additional care plan information)  Current Barriers:  . Diabetes: uncontrolled, most recent A1c 7.3% (03/28/20) o Patient reports she is feeling much improved and cough is  tolerable. She is frustrated with wearing oxygen 24/7 and looking forward to "getting rid of these blood clots"  Completed course of prednisone. Reports her glucose readings are now back to normal. She is slightly disappointed at her recent A1C reading of 7.3% (up from 7.0% in April) and reports she tries very hard to keep her blood sugar controlled. o Denies any episodes of hypoglycemia and states she becomes symptomatic with weakness, nausea, sweating and dizziness when her BG reaches 105-110. Reports a  history of passing out with a BG level of 50.  [employer] representative called her yesterday stating they will be sending a faxed request for new glucometer and supplies to this office for approval from PCP. Her [place] meter and supplies will no longer be available. I checked the chart but do not see documentation that we have received request. . Current antihyperglycemic regimen: Trulicity 3.0 mg, metformin 1000 mg BID, Tresiba 20-25 units daily (adjusts based on BG to avoid hypoglycemia) . Current glucose readings:  o Reports sugars have improved now that she is off prednisone. o Fasting: 120-140s o 2 hour after meals: ~150-180s, sometimes 230-250s . Cardiovascular risk reduction: o Current hypertensive regimen: Metoprolol succinate 12.35m daily home BP readings have been "good" she remembers one reading from Sunday 132/68. She checks BP 3-4 times weekly. o Current hyperlipidemia regimen: atorvastatin 20 mg daily, LDL at goal <70;  o Current antiplatelet regimen: none right now d/t Eliquis . COPD; Trelegy QAM, albuterol PRN (has not needed a dose in 1-2 months); O2  continuous o Patient reports Trelegy as very effective.  She is recovering from recent bronchitis and endorsed baseline lack of energy abd some diarrhea with Augmentin prescribed by Pulmonogy on 04/13/20. She is halfway through her 10 day course.  .Marland KitchenHx PE: Eliquis 5 mg BID per Dr. FRaul Del Will re-evaluate after 6 months tx.(Next appt  07/04/20) Denies any s/sx bleeding/brusing . Chronic pain: follows w/ pain management; gabapentin 800 mg TID, cyclobenzaprine 5 mg BID PRN. Intrathecal morphine pump. . Mood: venlafaxine XR 150 mg QAM, reports feeling well controlled. Became tearful when recounting sitting with her father at the end of his life 359years ago. She recalls only sleeping about 4 hours during those two weeks and believes this to be the root of her continued insomnia. States she has previously talked with counselors about this experience. She reports she retried melatonin for ~ 2/5 weeks and stopped due to ineffectiveness. She is no longer taking quetiapine as it did not work for her. She reports  sleeping 2-3 hours most nights and she considers this adequate for her.   Pharmacist Clinical Goal(s):  [name] Over the next 90 days, patient with work with PharmD and primary care provider to address optimized diabetes management  Interventions: . Comprehensive medication review performed, medication list updated in electronic medical record . Inter-disciplinary care team collaboration (see longitudinal plan of care) . Provided empathetic listening. Recommended she continue working with LCSW for mental health support including relaxation and sleep hygiene   . Discussed diet and physical activity. Praised patient for cutting back from > 1 gallon to one glass of sweet tea daily. She tells me she has started adding "a little" unsweetened tea to her daily glass. She eats lunch at [employer] every day which usually consists of veggies and a lean protein.  . Reassured patient. Discussed most recent A1c reflects 3 month period during which she had many stressors.  . Discussed Tyler Aas having greater effect on fasting BG while Trulicity effects both fasting and postprandial. . Encouraged patient to increase fluid intake and consider taking a probiotic or increasing intake of fermented foods including yogurt, sauerkraut or kefir to help with  antibiotic associated diarrhea.  Patient Self Care Activities:  . Patient will check blood glucose BID, document, and provide at future appointments . Patient will report any questions or concerns to provider  . Patient will continue monitoring blood pressure 3-4 times weekly, document and provide at future appointments.  Please see past updates related to this goal by clicking on the "Past Updates" button in the selected goal         Follow-Up:  Pharmacist Review-Spoke with patient regarding her BG monitoring; patient states she is checking blood sugar levels 3-4 times weekly, she is drinking half cup of sweet tea with extra ice 2 times a day, she is taking metformin 1063m before she eats dinner, Trulicity and TTyler Aashas helped her tremendously. Patient has no complaints or concerns with her blood sugars at this time. JBirdena Crandall CPP notified.    CRaynelle Highland CVermilionAssistant [phone]

## 2020-06-05 DIAGNOSIS — M545 Low back pain, unspecified: Secondary | ICD-10-CM | POA: Diagnosis not present

## 2020-06-05 DIAGNOSIS — Z978 Presence of other specified devices: Secondary | ICD-10-CM | POA: Diagnosis not present

## 2020-06-05 DIAGNOSIS — J449 Chronic obstructive pulmonary disease, unspecified: Secondary | ICD-10-CM | POA: Diagnosis not present

## 2020-06-05 DIAGNOSIS — G894 Chronic pain syndrome: Secondary | ICD-10-CM | POA: Diagnosis not present

## 2020-06-05 DIAGNOSIS — M542 Cervicalgia: Secondary | ICD-10-CM | POA: Diagnosis not present

## 2020-06-05 DIAGNOSIS — J45909 Unspecified asthma, uncomplicated: Secondary | ICD-10-CM | POA: Diagnosis not present

## 2020-06-07 NOTE — Telephone Encounter (Signed)
Pt has been r/s  

## 2020-06-11 ENCOUNTER — Ambulatory Visit (INDEPENDENT_AMBULATORY_CARE_PROVIDER_SITE_OTHER): Payer: Medicare HMO

## 2020-06-11 ENCOUNTER — Other Ambulatory Visit: Payer: Self-pay

## 2020-06-11 DIAGNOSIS — I959 Hypotension, unspecified: Secondary | ICD-10-CM | POA: Diagnosis not present

## 2020-06-11 DIAGNOSIS — E538 Deficiency of other specified B group vitamins: Secondary | ICD-10-CM | POA: Diagnosis not present

## 2020-06-11 DIAGNOSIS — R0602 Shortness of breath: Secondary | ICD-10-CM | POA: Diagnosis not present

## 2020-06-11 MED ORDER — CYANOCOBALAMIN 1000 MCG/ML IJ SOLN
1000.0000 ug | Freq: Once | INTRAMUSCULAR | Status: AC
  Administered 2020-06-11: 1000 ug via INTRAMUSCULAR

## 2020-06-18 ENCOUNTER — Other Ambulatory Visit: Payer: Self-pay | Admitting: Nurse Practitioner

## 2020-06-18 NOTE — Telephone Encounter (Signed)
Routing to CRP

## 2020-06-19 ENCOUNTER — Other Ambulatory Visit: Payer: Self-pay | Admitting: Nurse Practitioner

## 2020-06-19 MED ORDER — GABAPENTIN 800 MG PO TABS
800.0000 mg | ORAL_TABLET | Freq: Three times a day (TID) | ORAL | 0 refills | Status: DC
Start: 2020-06-19 — End: 2020-10-25

## 2020-06-19 NOTE — Telephone Encounter (Signed)
Medication Refill - Medication: gabapentin (NEURONTIN) 800 MG tablet (Pharmacy called requesting a shortfill be sent to patients local pharmacy until mail order comes in)   Has the patient contacted their pharmacy? yes (Agent: If no, request that the patient contact the pharmacy for the refill.) (Agent: If yes, when and what did the pharmacy advise?)Contact PCP  Preferred Pharmacy (with phone number or street name):  [facility] [place] Phone:  [phone]  Fax:  [phone]       Agent: Please be advised that RX refills may take up to 3 business days. We ask that you follow-up with your pharmacy.

## 2020-06-19 NOTE — Telephone Encounter (Signed)
Requested Prescriptions  Pending Prescriptions Disp Refills  . gabapentin (NEURONTIN) 800 MG tablet [Pharmacy Med Name: GABAPENTIN 800 MG Tablet] 270 tablet 0    Sig: TAKE 1 TABLET THREE TIMES DAILY     Neurology: Anticonvulsants - gabapentin Passed - 06/19/2020 12:40 PM      Passed - Valid encounter within last 12 months    Recent Outpatient Visits          2 months ago Suspected COVID-19 virus infection   [facility] Terry, Megan P, DO   2 months ago Type 2 diabetes mellitus with diabetic neuropathy, with long-term current use of insulin ([place])   [place], Jolene T, NP   5 months ago Acute pulmonary embolism without acute cor pulmonale, unspecified pulmonary embolism type ([place])   [place], Henrine Screws T, NP   5 months ago Community acquired pneumonia of right lower lobe of lung   [facility] [place], [place] T, NP   6 months ago Angular cheilitis   [place], [name], NP      Future Appointments            In 1 week [name], [name], NP [employer], [facility]   In 2 weeks End, Harrell Gave, MD [place]   In 5 months  [employer], [place]

## 2020-06-29 ENCOUNTER — Other Ambulatory Visit: Payer: Self-pay | Admitting: Nurse Practitioner

## 2020-07-01 ENCOUNTER — Encounter: Payer: Self-pay | Admitting: Nurse Practitioner

## 2020-07-01 DIAGNOSIS — I700 Atherosclerosis of aorta: Secondary | ICD-10-CM | POA: Insufficient documentation

## 2020-07-02 ENCOUNTER — Ambulatory Visit: Payer: Medicare HMO | Admitting: Nurse Practitioner

## 2020-07-02 ENCOUNTER — Telehealth: Payer: Self-pay | Admitting: Nurse Practitioner

## 2020-07-02 DIAGNOSIS — C541 Malignant neoplasm of endometrium: Secondary | ICD-10-CM | POA: Diagnosis not present

## 2020-07-02 NOTE — Telephone Encounter (Signed)
Scheduled 12/23 °

## 2020-07-02 NOTE — Telephone Encounter (Signed)
Called to check on patient, as she rarely misses appointments in office.  Missed appointment today.  She reports she was unaware if it was virtual or in office, though virtual.  Discussed with her and will have staff reschedule her for in office and labs.  She stated appreciation for call.

## 2020-07-02 NOTE — Telephone Encounter (Signed)
Noted  

## 2020-07-04 ENCOUNTER — Ambulatory Visit (INDEPENDENT_AMBULATORY_CARE_PROVIDER_SITE_OTHER): Payer: Medicare HMO | Admitting: Internal Medicine

## 2020-07-04 ENCOUNTER — Other Ambulatory Visit: Payer: Self-pay

## 2020-07-04 ENCOUNTER — Other Ambulatory Visit: Payer: Self-pay | Admitting: Nurse Practitioner

## 2020-07-04 ENCOUNTER — Encounter: Payer: Self-pay | Admitting: Internal Medicine

## 2020-07-04 VITALS — BP 128/60 | HR 87 | Ht 58.5 in | Wt 140.0 lb

## 2020-07-04 DIAGNOSIS — R0600 Dyspnea, unspecified: Secondary | ICD-10-CM | POA: Diagnosis not present

## 2020-07-04 DIAGNOSIS — I5181 Takotsubo syndrome: Secondary | ICD-10-CM

## 2020-07-04 DIAGNOSIS — R0602 Shortness of breath: Secondary | ICD-10-CM | POA: Insufficient documentation

## 2020-07-04 DIAGNOSIS — Z86711 Personal history of pulmonary embolism: Secondary | ICD-10-CM | POA: Insufficient documentation

## 2020-07-04 DIAGNOSIS — R0609 Other forms of dyspnea: Secondary | ICD-10-CM

## 2020-07-04 NOTE — Patient Instructions (Addendum)
Medication Instructions:  Your physician recommends that you continue on your current medications as directed. Please refer to the Current Medication list given to you today.  *If you need a refill on your cardiac medications before your next appointment, please call your pharmacy*   Lab Work: none If you have labs (blood work) drawn today and your tests are completely normal, you will receive your results only by: [name] MyChart Message (if you have MyChart) OR . A paper copy in the mail If you have any lab test that is abnormal or we need to change your treatment, we will call you to review the results.   Testing/Procedures: Your physician has requested that you have an echocardiogram. Echocardiography is a painless test that uses sound waves to create images of your heart. It provides your doctor with information about the size and shape of your heart and how well your heart's chambers and valves are working. This procedure takes approximately one hour. There are no restrictions for this procedure. There is a possibility that an IV may need to be started during your test to inject an image enhancing agent. This is done to obtain more optimal pictures of your heart. Therefore we ask that you do at least drink some water prior to coming in to hydrate your veins.     Follow-Up: At [facility], you and your health needs are our priority.  As part of our continuing mission to provide you with exceptional heart care, we have created designated Provider Care Teams.  These Care Teams include your primary Cardiologist (physician) and Advanced Practice Providers (APPs -  Physician Assistants and Nurse Practitioners) who all work together to provide you with the care you need, when you need it.  We recommend signing up for the patient portal called "MyChart".  Sign up information is provided on this After Visit Summary.  MyChart is used to connect with patients for Virtual Visits (Telemedicine).   Patients are able to view lab/test results, encounter notes, upcoming appointments, etc.  Non-urgent messages can be sent to your provider as well.   To learn more about what you can do with MyChart, go to [email or web address].    Your next appointment:   6 month(s)  The format for your next appointment:   In Person  Provider:   You may see DR Harrell Gave END or one of the following Advanced Practice Providers on your designated Care Team:    Murray Hodgkins, NP  [name], PA-C  Marrianne Mood, PA-C  Cadence Bris[place], [place]  Laurann Montana, NP   Echocardiogram An echocardiogram is a procedure that uses painless sound waves (ultrasound) to produce an image of the heart. Images from an echocardiogram can provide important information about:  Signs of coronary artery disease (CAD).  Aneurysm detection. An aneurysm is a weak or damaged part of an artery wall that bulges out from the normal force of blood pumping through the body.  Heart size and shape. Changes in the size or shape of the heart can be associated with certain conditions, including heart failure, aneurysm, and CAD.  Heart muscle function.  Heart valve function.  Signs of a past heart attack.  Fluid buildup around the heart.  Thickening of the heart muscle.  A tumor or infectious growth around the heart valves. Tell a health care provider about:  Any allergies you have.  All medicines you are taking, including vitamins, herbs, eye drops, creams, and over-the-counter medicines.  Any blood disorders you have.  Any  surgeries you have had.  Any medical conditions you have.  Whether you are pregnant or may be pregnant. What are the risks? Generally, this is a safe procedure. However, problems may occur, including:  Allergic reaction to dye (contrast) that may be used during the procedure. What happens before the procedure? No specific preparation is needed. You may eat and drink normally. What  happens during the procedure?   An IV tube may be inserted into one of your veins.  You may receive contrast through this tube. A contrast is an injection that improves the quality of the pictures from your heart.  A gel will be applied to your chest.  A wand-like tool (transducer) will be moved over your chest. The gel will help to transmit the sound waves from the transducer.  The sound waves will harmlessly bounce off of your heart to allow the heart images to be captured in real-time motion. The images will be recorded on a computer. The procedure may vary among health care providers and hospitals. What happens after the procedure?  You may return to your normal, everyday life, including diet, activities, and medicines, unless your health care provider tells you not to do that. Summary  An echocardiogram is a procedure that uses painless sound waves (ultrasound) to produce an image of the heart.  Images from an echocardiogram can provide important information about the size and shape of your heart, heart muscle function, heart valve function, and fluid buildup around your heart.  You do not need to do anything to prepare before this procedure. You may eat and drink normally.  After the echocardiogram is completed, you may return to your normal, everyday life, unless your health care provider tells you not to do that. This information is not intended to replace advice given to you by your health care provider. Make sure you discuss any questions you have with your health care provider. Document Revised: 11/11/2018 Document Reviewed: 08/23/2016 Elsevier Patient Education  [place].

## 2020-07-04 NOTE — Progress Notes (Signed)
Follow-up Outpatient Visit Date: 07/04/2020  Primary Care Provider: [name], NP [place] [address]  Chief Complaint: Shortness of breath  HPI:  Jordan Chan is a 69 y.o. female with history of stress-induced cardiomyopathy in 04/2017 in the setting of pain pump malfunction and opioid withdrawal, recently diagnosed PE following hospitalization for pneumonia, chronic low back pain, diabetes mellitus, COPD,GERD, sleep apnea, and IBS, who presents for follow-up of cardiomyopathy.  I last saw Jordan Chan in May, at which time she was gradually improving from her recent pneumonia and pulmonary embolism.  She continued to complain of right-sided chest pain with deep inspiration as well as exertional dyspnea that was slightly worse than her baseline.  We did not make any medication changes or pursue additional testing at that time.  Jordan Chan reports that she is gradually improving from her pneumonia and subsequent PE this summer.  She remains on supplemental oxygen (2 L via nasal cannula) with shortness of breath with mild activity.  She anticipates completion of a 70-monthcourse of apixaban later this month or next month.  She has mild transient ankle swelling but otherwise no significant edema.  She denies chest pain, palpitations, or lightheadedness.  Jordan Chan a single episode of sudden diaphoresis and nausea while at [employer] a few months ago.  It was preceded by 5 minutes of pain in her neck.  The symptoms resolved on their own and have not recurred.  She is concerned this could reflect a heart attack, though she never had chest pain.  --------------------------------------------------------------------------------------------------  Cardiovascular History & Procedures: Cardiovascular Problems:  Stress-induced cardiomyopathy  Risk Factors:  Diabetes mellitus, prior tobacco use, and age greater than 663 Cath/PCI:  LHC (04/28/17): No  angiographically significant CAD. Severely reduced LVEF (25-30%) with mid and apical akinesis consistent with stress-induced cardiomyopathy. Mildly elevated left ventricular filling pressure.  CV Surgery:  None  EP Procedures and Devices:  None  Non-Invasive Evaluation(s):  Limited TTE (08/13/17): LVEF 50-55%.  TTE (05/15/17,[facility], [place]):Normal LV size and wall thickness. LVEF 45-50% with global hypokinesis. Grade 1 diastolic dysfunction. Normal RV size and function.  TTE (04/28/17): Upper normal LV size with normal wall thickness. LVEF severely reduced at 25-30%. There is severe hypokinesis of the mid and apical myocardium suggestive of stress-induced cardiomyopathy. Mild left atrial enlargement. Poorly visualized RV, grossly normal in size.  Recent CV Pertinent Labs: Lab Results  Component Value Date   CHOL 99 (L) 03/28/2020   CHOL 127 07/26/2019   HDL 55 03/28/2020   LDLCALC 29 03/28/2020   TRIG 67 03/28/2020   TRIG 104 07/26/2019   INR 1.2 12/11/2019   BNP 546.0 (H) 12/12/2019   K 4.6 03/31/2020   K 3.5 09/14/2013   MG 2.0 12/19/2019   BUN 13 03/31/2020   BUN 11 03/28/2020   BUN 8 09/14/2013   CREATININE 0.70 03/31/2020   CREATININE 0.68 09/14/2013    Past medical and surgical history were reviewed and updated in EPIC.  Current Meds  Medication Sig  . ACCU-CHEK AVIVA PLUS test strip TEST THREE TIMES DAILY  . Accu-Chek Softclix Lancets lancets TEST BLOOD SUGAR THREE TIMES DAILY  . albuterol (PROVENTIL HFA;VENTOLIN HFA) 108 (90 BASE) MCG/ACT inhaler Inhale 2 puffs into the lungs every 6 (six) hours as needed for wheezing or shortness of breath.   . Alcohol Swabs (B-D SINGLE USE SWABS REGULAR) PADS USE TWICE DAILY  WITH  SUGAR  CHECKS  . apixaban (ELIQUIS) 5  MG TABS tablet Take 5 mg by mouth 2 (two) times daily.   [name] atorvastatin (LIPITOR) 20 MG tablet TAKE 1 TABLET EVERY DAY  . Blood Glucose Monitoring Suppl (TRUE METRIX METER) w/Device  KIT To check blood sugar 4 times day.  . cholecalciferol (VITAMIN D3) 25 MCG (1000 UNIT) tablet Take 1,000 Units by mouth daily.  . Cyanocobalamin 1000 MCG/ML KIT Inject 1,000 mcg as directed every 30 (thirty) days.  . cyclobenzaprine (FLEXERIL) 5 MG tablet Take 5 mg by mouth 2 (two) times daily as needed for muscle spasms. Takes very rarely  . diclofenac (VOLTAREN) 50 MG EC tablet TAKE 1 TABLET THREE TIMES DAILY  . Dulaglutide (TRULICITY) 3 WI/2.0BT SOPN Inject 0.5 mLs (3 mg total) as directed once a week.  . gabapentin (NEURONTIN) 800 MG tablet Take 1 tablet (800 mg total) by mouth 3 (three) times daily.  [name] lactulose (CHRONULAC) 10 GM/15ML solution Take 45 mLs (30 g total) by mouth daily as needed for mild constipation or severe constipation.  [name] lisinopril (ZESTRIL) 5 MG tablet TAKE 1 TABLET EVERY DAY  . metFORMIN (GLUCOPHAGE) 1000 MG tablet TAKE 1 TABLET TWICE DAILY WITH MEALS  . metoprolol succinate (TOPROL-XL) 25 MG 24 hr tablet TAKE 1/2 TABLET EVERY DAY  . PAIN MANAGEMENT INTRATHECAL, IT, PUMP 1 each by Intrathecal route. Intrathecal (IT) medication:  Morphine Patient does not remember current. Adjusted every 2.5 months at [facility] in [place].  . TRELEGY ELLIPTA 100-62.5-25 MCG/INH AEPB INHALE 1 PUFF INTO THE LUNGS DAILY.  [name] TRESIBA FLEXTOUCH 100 UNIT/ML FlexTouch Pen INJECT 30 UNITS INTO THE SKIN AT BEDTIME.  [name] venlafaxine XR (EFFEXOR-XR) 150 MG 24 hr capsule Take 1 capsule (150 mg total) by mouth daily.    Allergies: Other, Pain patch [menthol], Fentanyl, Avelox [moxifloxacin hcl in nacl], Doxycycline, Erythromycin, Moxifloxacin hcl, Oxycontin [oxycodone], and Ozempic [semaglutide]  Social History   Tobacco Use  . Smoking status: Former Smoker    Packs/day: 1.00    Years: 30.00    Pack years: 30.00    Types: Cigarettes    Quit date: 04/30/2004    Years since quitting: 16.1  . Smokeless tobacco: Never Used  Vaping Use  . Vaping Use: Never used  Substance  Use Topics  . Alcohol use: No  . Drug use: No    Family History  Problem Relation Age of Onset  . Cancer Mother 61       lung  . Thyroid disease Mother   . Cancer Father 7       Pancreatic  . Hypertension Sister   . Cancer Sister        "cancer on face"  . Hyperlipidemia Sister   . Osteoporosis Maternal Grandmother   . Hyperlipidemia Son   . Seizures Son   . Cancer Paternal Grandmother        colon  . Arthritis Paternal Grandfather   . Breast cancer Neg Hx     Review of Systems: A 12-system review of systems was performed and was negative except as noted in the HPI.  --------------------------------------------------------------------------------------------------  Physical Exam: BP 128/60 (BP Location: Left Arm, Patient Position: Sitting, Cuff Size: Normal)   Pulse 87   Ht 4' 10.5" (1.486 m)   Wt 140 lb (63.5 kg)   LMP  (LMP Unknown)   SpO2 99%   BMI 28.76 kg/m   General: NAD.  Seated in a wheelchair. HEENT: No conjunctival pallor or scleral icterus. Facemask in place. Neck: Supple without lymphadenopathy, thyromegaly,  JVD, or HJR. Lungs: Mildly diminished breath sounds throughout without wheezes or crackles. Heart: Regular rate and rhythm without murmurs, rubs, or gallops. Abd: Bowel sounds present. Soft, NT/ND. Ext: No lower extremity edema.  EKG: Normal sinus rhythm without abnormality.  Lab Results  Component Value Date   WBC 17.6 (H) 03/31/2020   HGB 12.2 03/31/2020   HCT 38.6 03/31/2020   MCV 90.0 03/31/2020   PLT 355 03/31/2020    Lab Results  Component Value Date   NA 136 03/31/2020   K 4.6 03/31/2020   CL 100 03/31/2020   CO2 29 03/31/2020   BUN 13 03/31/2020   CREATININE 0.70 03/31/2020   GLUCOSE 133 (H) 03/31/2020   ALT 11 12/12/2019    Lab Results  Component Value Date   CHOL 99 (L) 03/28/2020   HDL 55 03/28/2020   LDLCALC 29 03/28/2020   TRIG 67 03/28/2020     --------------------------------------------------------------------------------------------------  ASSESSMENT AND PLAN: Dyspnea on exertion and history of pulmonary embolism: I suspect that Jordan Chan persistent exertional dyspnea and chronic respiratory failure with hypoxia may be related to her PE and pneumonia earlier this year.  I have recommended repeating an echocardiogram to ensure that she has not developed cardiomyopathy or evidence of pulmonary hypertension/right heart failure.  She should continue her current medications and follow-up with [name] as previously recommended.  Stress-induced cardiomyopathy: Jordan Chan appears euvolemic on exam but has considerable exertional dyspnea.  This is likely multifactorial, as outlined above.  We have agreed to obtain an echo, as outlined above.  Her episode of transient nausea and diaphoresis a few months ago seems unusual for MI, particularly given that she has not had any further symptoms and had a clean catheterization in 2018.  We will defer ischemia work-up at this time.  Follow-up: Return to clinic in 6 months.  [name], MD 07/04/2020 9:42 AM

## 2020-07-05 DIAGNOSIS — J45909 Unspecified asthma, uncomplicated: Secondary | ICD-10-CM | POA: Diagnosis not present

## 2020-07-05 DIAGNOSIS — J449 Chronic obstructive pulmonary disease, unspecified: Secondary | ICD-10-CM | POA: Diagnosis not present

## 2020-07-08 NOTE — Chronic Care Management (AMB) (Deleted)
Chronic Care Management Pharmacy  Name: Jordan Chan  MRN: [id number] DOB: 06-06-1951   Chief Complaint/ HPI  Jordan Chan,  69 y.o. , female presents for her Follow-Up CCM visit with the clinical pharmacist via telephone.  PCP : [name], NP Patient Care Team: [name], NP as PCP - General (Nurse Practitioner) End, Harrell Gave, MD as PCP - Cardiology (Cardiology) Erby Pian, MD as Referring Physician (Pulmonary Disease) Kandace Blitz, MD as Referring Physician (Pain Medicine) Greg Cutter, LCSW as Social Worker (Licensed Clinical Social Worker) Vladimir Faster, [facility] as Pharmacist (Pharmacist)  Patient's chronic conditions include: Hypertension, Hyperlipidemia, Diabetes, GERD, COPD, Depression, Allergic Rhinitis and H/O PE, NSTEMI, breast cancer, vitamin b12 & D deficiencies and chronic pain with intrathecal pain pump. H/O stress induced cardiomyopathy  Office Visits: Scheduled OV 07/26/20  Consult Visit: 06/05/20- Dr. Judi Cong- [facility]- intrathecal pump refill and programming Morphine 73m/mL + Bupivacaine 24mml + Clonidine 10019mml at rates of 2.5023m22my + 10.009mg58m + 50.05mcg14m next refill 08/09/20 07/04/20- Dr. Ends- [facility]- 12 leasd ekg, echo for 07/11/20   Allergies  Allergen Reactions  . Other Palpitations    IV steroids  . Pain Patch [Menthol] Anaphylaxis  . Fentanyl   . Avelox [Moxifloxacin Hcl In Nacl] Other (See Comments)    Upset stomach  . Doxycycline Diarrhea  . Erythromycin Nausea Only and Other (See Comments)    Can take a Z-Pak just fine Other reaction(s): Other (See Comments) Can take Z-Pak  Can take a Z-Pak just fine  . Moxifloxacin Hcl Other (See Comments)    Upset stomach Upset stomach  . Oxycontin [Oxycodone] Hives  . Ozempic [Semaglutide] Nausea Only    Medications: Outpatient Encounter Medications as of 07/09/2020  Medication Sig  . ACCU-CHEK AVIVA PLUS test strip  TEST THREE TIMES DAILY  . Accu-Chek Softclix Lancets lancets TEST BLOOD SUGAR THREE TIMES DAILY  . albuterol (PROVENTIL HFA;VENTOLIN HFA) 108 (90 BASE) MCG/ACT inhaler Inhale 2 puffs into the lungs every 6 (six) hours as needed for wheezing or shortness of breath.   . Alcohol Swabs (B-D SINGLE USE SWABS REGULAR) PADS USE TWICE DAILY  WITH  SUGAR  CHECKS  . apixaban (ELIQUIS) 5 MG TABS tablet Take 5 mg by mouth 2 (two) times daily.   . atorMarland Kitchenastatin (LIPITOR) 20 MG tablet TAKE 1 TABLET EVERY DAY  . Blood Glucose Monitoring Suppl (TRUE METRIX METER) w/Device KIT To check blood sugar 4 times day.  . cholecalciferol (VITAMIN D3) 25 MCG (1000 UNIT) tablet Take 1,000 Units by mouth daily.  . Cyanocobalamin 1000 MCG/ML KIT Inject 1,000 mcg as directed every 30 (thirty) days.  . cyclobenzaprine (FLEXERIL) 5 MG tablet Take 5 mg by mouth 2 (two) times daily as needed for muscle spasms. Takes very rarely  . diclofenac (VOLTAREN) 50 MG EC tablet TAKE 1 TABLET THREE TIMES DAILY  . Dulaglutide (TRULICITY) 3 MG/0.5ZH/2.9JMInject 0.5 mLs (3 mg total) as directed once a week.  . gabapentin (NEURONTIN) 800 MG tablet Take 1 tablet (800 mg total) by mouth 3 (three) times daily.  . lactMarland Kitchenlose (CHRONULAC) 10 GM/15ML solution Take 45 mLs (30 g total) by mouth daily as needed for mild constipation or severe constipation.  . lisiMarland Kitchenopril (ZESTRIL) 5 MG tablet TAKE 1 TABLET EVERY DAY  . metFORMIN (GLUCOPHAGE) 1000 MG tablet TAKE 1 TABLET TWICE DAILY WITH MEALS  . metoprolol succinate (TOPROL-XL) 25 MG 24 hr tablet TAKE 1/2 TABLET EVERY DAY  . PAIN MANAGEMENT  INTRATHECAL, IT, PUMP 1 each by Intrathecal route. Intrathecal (IT) medication:  Morphine Patient does not remember current. Adjusted every 2.5 months at [facility] in [place].  . TRELEGY ELLIPTA 100-62.5-25 MCG/INH AEPB INHALE 1 PUFF INTO THE LUNGS DAILY.  [name] TRESIBA FLEXTOUCH 100 UNIT/ML FlexTouch Pen INJECT 30 UNITS INTO THE SKIN AT BEDTIME.  [name]  venlafaxine XR (EFFEXOR-XR) 150 MG 24 hr capsule Take 1 capsule (150 mg total) by mouth daily.   No facility-administered encounter medications on file as of 07/09/2020.    Wt Readings from Last 3 Encounters:  07/04/20 140 lb (63.5 kg)  03/28/20 138 lb 6.4 oz (62.8 kg)  12/30/19 137 lb (62.1 kg)    Current Diagnosis/Assessment:    Goals Addressed   None     Diabetes   A1c goal <7%  Recent Relevant Labs: Lab Results  Component Value Date/Time   HGBA1C 7.3 (H) 03/28/2020 09:18 AM   HGBA1C 7.1 (H) 12/12/2019 07:38 AM   HGBA1C 7.0 (H) 11/15/2019 09:38 AM   HGBA1C 7.7% 04/28/2016 12:00 AM   MICROALBUR 10 11/15/2019 09:38 AM   MICROALBUR 10 01/17/2019 08:36 AM    Last diabetic Eye exam:  Lab Results  Component Value Date/Time   HMDIABEYEEXA No Retinopathy 03/14/2020 12:00 AM    Last diabetic Foot exam: No results found for: HMDIABFOOTEX   Checking BG: {CHL HP Blood Glucose Monitoring Frequency:[phone]}  Recent FBG Readings: *** Recent pre-meal BG readings: *** Recent 2hr PP BG readings:  *** Recent HS BG readings: ***  Patient has failed these meds in past: *** Patient is currently {CHL Controlled/[phone]} on the following medications: . Trulicity 3.0 mg  . Metformin 1000 mg bid . Tresiba 20-25 units daily  We discussed: diet and exercise extensively and how to recognize and treat signs of hypoglycemia. New Glucometer?  Plan  Continue {CHL HP Upstream Pharmacy [phone]}  Hypertension   BP goal is:  <130/80  Office blood pressures are  BP Readings from Last 3 Encounters:  07/04/20 128/60  03/28/20 123/67  01/20/20 119/70   Patient checks BP at home {CHL HP BP Monitoring [phone]} Patient home BP readings are ranging: ***  Patient has failed these meds in the past: NA Patient is currently {CHL Controlled/[phone]} on the following medications:  . *  We discussed {CHL HP Upstream Pharmacy  discussion:[phone]}  Plan  Continue {CHL HP Upstream Pharmacy TXMIW:[id number]}        Medication Management   Patient's preferred pharmacy is:  [place], [place] [address] Phone: [phone] Fax: [phone]  [place], [place] - [address] [place] [address] Phone: [phone] Fax: [phone]  Uses pill box? {Yes or If no, why not?:20788} Pt endorses ***% compliance  We discussed: {Pharmacy options:24294}  Plan  {US Pharmacy OLMB:86754}    Follow up: *** month phone visit  ***

## 2020-07-09 ENCOUNTER — Telehealth: Payer: Self-pay

## 2020-07-15 ENCOUNTER — Other Ambulatory Visit: Payer: Self-pay | Admitting: Nurse Practitioner

## 2020-07-16 DIAGNOSIS — R0600 Dyspnea, unspecified: Secondary | ICD-10-CM | POA: Diagnosis not present

## 2020-07-16 DIAGNOSIS — Z86711 Personal history of pulmonary embolism: Secondary | ICD-10-CM | POA: Diagnosis not present

## 2020-07-16 DIAGNOSIS — Z7901 Long term (current) use of anticoagulants: Secondary | ICD-10-CM | POA: Diagnosis not present

## 2020-07-16 DIAGNOSIS — R059 Cough, unspecified: Secondary | ICD-10-CM | POA: Diagnosis not present

## 2020-07-16 DIAGNOSIS — J449 Chronic obstructive pulmonary disease, unspecified: Secondary | ICD-10-CM | POA: Diagnosis not present

## 2020-07-20 ENCOUNTER — Encounter: Payer: Self-pay | Admitting: Nurse Practitioner

## 2020-07-20 DIAGNOSIS — E119 Type 2 diabetes mellitus without complications: Secondary | ICD-10-CM | POA: Insufficient documentation

## 2020-07-20 DIAGNOSIS — E1165 Type 2 diabetes mellitus with hyperglycemia: Secondary | ICD-10-CM | POA: Insufficient documentation

## 2020-07-20 DIAGNOSIS — E1140 Type 2 diabetes mellitus with diabetic neuropathy, unspecified: Secondary | ICD-10-CM | POA: Insufficient documentation

## 2020-07-23 ENCOUNTER — Ambulatory Visit: Payer: Medicare HMO | Admitting: Licensed Clinical Social Worker

## 2020-07-23 ENCOUNTER — Telehealth: Payer: Medicare HMO

## 2020-07-23 DIAGNOSIS — J449 Chronic obstructive pulmonary disease, unspecified: Secondary | ICD-10-CM

## 2020-07-23 DIAGNOSIS — E1140 Type 2 diabetes mellitus with diabetic neuropathy, unspecified: Secondary | ICD-10-CM

## 2020-07-23 DIAGNOSIS — F324 Major depressive disorder, single episode, in partial remission: Secondary | ICD-10-CM

## 2020-07-23 NOTE — Chronic Care Management (AMB) (Signed)
Chronic Care Management    Clinical Social Work Follow Up Note  07/23/2020 Name: Jordan Chan MRN: [id number] DOB: 1951/06/12  Jordan Chan is a 69 y.o. year old female who is a primary care patient of [name], [name], NP. The CCM team was consulted for assistance with Mental Health Counseling and Resources.   Review of patient status, including review of consultants reports, other relevant assessments, and collaboration with appropriate care team members and the patient's provider was performed as part of comprehensive patient evaluation and provision of chronic care management services.    SDOH (Social Determinants of Health) assessments performed: Yes    Outpatient Encounter Medications as of 07/23/2020  Medication Sig  . ACCU-CHEK AVIVA PLUS test strip TEST THREE TIMES DAILY  . Accu-Chek Softclix Lancets lancets TEST BLOOD SUGAR THREE TIMES DAILY  . albuterol (PROVENTIL HFA;VENTOLIN HFA) 108 (90 BASE) MCG/ACT inhaler Inhale 2 puffs into the lungs every 6 (six) hours as needed for wheezing or shortness of breath.   . Alcohol Swabs (B-D SINGLE USE SWABS REGULAR) PADS USE TWICE DAILY  WITH  SUGAR  CHECKS  . apixaban (ELIQUIS) 5 MG TABS tablet Take 5 mg by mouth 2 (two) times daily.   [name] atorvastatin (LIPITOR) 20 MG tablet TAKE 1 TABLET EVERY DAY  . Blood Glucose Monitoring Suppl (TRUE METRIX METER) w/Device KIT To check blood sugar 4 times day.  . cholecalciferol (VITAMIN D3) 25 MCG (1000 UNIT) tablet Take 1,000 Units by mouth daily.  . Cyanocobalamin 1000 MCG/ML KIT Inject 1,000 mcg as directed every 30 (thirty) days.  . cyclobenzaprine (FLEXERIL) 5 MG tablet Take 5 mg by mouth 2 (two) times daily as needed for muscle spasms. Takes very rarely  . diclofenac (VOLTAREN) 50 MG EC tablet TAKE 1 TABLET THREE TIMES DAILY  . Dulaglutide (TRULICITY) 3 WN/4.6EV SOPN Inject 0.5 mLs (3 mg total) as directed once a week.  . gabapentin (NEURONTIN) 800 MG tablet Take 1 tablet (800 mg  total) by mouth 3 (three) times daily.  [name] lactulose (CHRONULAC) 10 GM/15ML solution Take 45 mLs (30 g total) by mouth daily as needed for mild constipation or severe constipation.  [name] lisinopril (ZESTRIL) 5 MG tablet TAKE 1 TABLET EVERY DAY  . metFORMIN (GLUCOPHAGE) 1000 MG tablet TAKE 1 TABLET TWICE DAILY WITH MEALS  . metoprolol succinate (TOPROL-XL) 25 MG 24 hr tablet TAKE 1/2 TABLET EVERY DAY  . PAIN MANAGEMENT INTRATHECAL, IT, PUMP 1 each by Intrathecal route. Intrathecal (IT) medication:  Morphine Patient does not remember current. Adjusted every 2.5 months at [facility] in [place].  . TRELEGY ELLIPTA 100-62.5-25 MCG/INH AEPB INHALE 1 PUFF INTO THE LUNGS DAILY.  [name] TRESIBA FLEXTOUCH 100 UNIT/ML FlexTouch Pen INJECT 30 UNITS INTO THE SKIN AT BEDTIME.  [name] venlafaxine XR (EFFEXOR-XR) 150 MG 24 hr capsule Take 1 capsule (150 mg total) by mouth daily.   No facility-administered encounter medications on file as of 07/23/2020.     Goals Addressed            This Visit's Progress   . SW-Track and Manage My Symptoms-Depression       Timeframe:  Long-Range Goal Priority:  Medium Start Date: 07/23/20                         Expected End Date:  10/22/19                    Follow Up Date-  90 days from 07/23/20   - avoid negative self-talk - develop a personal safety plan - develop a plan to deal with triggers like holidays, anniversaries - exercise at least 2 to 3 times per week - have a plan for how to handle bad days - journal feelings and what helps to feel better or worse - spend time or talk with others at least 2 to 3 times per week - spend time or talk with others every day - watch for early signs of feeling worse - write in journal every day    Why is this important?    Keeping track of your progress will help your treatment team find the right mix of medicine and therapy for you.   Write in your journal every day.   Day-to-day changes in depression  symptoms are normal. It may be more helpful to check your progress at the end of each week instead of every day.     Current Barriers:  . Financial constraints  . Limited social support . Level of care concerns . ADL IADL limitations . Social Isolation . Depression  Clinical Social Work Clinical Goal(s):  [name] Over the next 120 days, patient/caregiver will work with SW to address concerns related to lack of support/resource connection. LCSW will assist patient in gaining additional support/resource connection and community resource education in order to maintain health and mental health appropriately  .  Over the next 120 days, patient will demonstrate improved adherence to self care as evidenced by implementing healthy self-care into her daily routine such as: attending all medical appointments, deep breathing exercises, taking time for self and self-reflection, taking medications as prescribed, drinking water and daily exercise to improve mobility.  .  Over the next 120 days, patient will demonstrate improved health management independence as evidenced by implementing healthy self-care skills and positive support/resources into her daily routine to help cope with stressors and improve overall physical and mental health and well-being   Interventions: . Patient interviewed and appropriate assessments performed . Patient has upcoming office visit with PCP related to chronic leg pain. She reports that her left leg is weak and causes pain. She is unable to ambulate well due to this concern.  . Patient feels her depression has improved but her insomnia is still chronic. Patient reports having low motivation and energy.  . Provided patient with information about available socialization opportunities within her nearby area. Patient was educated on [place] and [employer].  [name] LCSW discussed coping skills for depression management. SW used empathetic and active and reflective  listening, validated patient's feelings/concerns, and provided emotional support. LCSW provided self-care education to help manage her multiple health conditions and improve her mood.  . Discussed plans with patient for ongoing care management follow up and provided patient with direct contact information for care management team . Assisted patient/caregiver with obtaining information about health plan benefits . Provided education and assistance to client regarding Advanced Directives. . A voluntary and extensive discussion about advanced care planning including explanation and discussion of advanced was undertaken with the patient and family.  Explanation regarding healthcare proxy and living will was reviewed and packet with forms with explanation of how to fill them out was given.   . Provided education to patient/caregiver about Hospice and/or Palliative Care services . Patient admits ongoing isolation due to the recent increase in COVID cases in . Patient reports that she wants to take all precautions neccessary to prevent having a COVID  infection which has taken a toll on her ability to socialize. LCSW provided education on how to increase her socialization within her environment. [facility] [phone]) contact information provided. This program offers free check in's to elderly residents in [place] that lack a support network.  . Patient reports implementing appropriate self-care habits into her daily routine such as: drinking water, staying active around the house, taking her medications as prescribed, combating negative thoughts or emotions and staying connected with her family and friends. Positive reinforcement provided.   Patient Self Care Activities:  . Attends all scheduled provider appointments . Unable to independently manage healthcare expenses for self and spouse  Please see past updates related to this goal by clicking on the "Past Updates" button in the selected goal        Follow Up Plan: SW will follow up with patient by phone over the next quarter  Eula Fried, BSW, MSW, [email or web address] Phone: [phone]

## 2020-07-25 ENCOUNTER — Ambulatory Visit (INDEPENDENT_AMBULATORY_CARE_PROVIDER_SITE_OTHER): Payer: Medicare HMO

## 2020-07-25 ENCOUNTER — Other Ambulatory Visit: Payer: Self-pay

## 2020-07-25 DIAGNOSIS — Z86711 Personal history of pulmonary embolism: Secondary | ICD-10-CM

## 2020-07-25 DIAGNOSIS — R0609 Other forms of dyspnea: Secondary | ICD-10-CM

## 2020-07-25 DIAGNOSIS — R0600 Dyspnea, unspecified: Secondary | ICD-10-CM

## 2020-07-25 LAB — ECHOCARDIOGRAM COMPLETE
AR max vel: 2.25 cm2
AV Area VTI: 2.13 cm2
AV Area mean vel: 1.96 cm2
AV Mean grad: 4 mmHg
AV Peak grad: 7.7 mmHg
Ao pk vel: 1.39 m/s
Area-P 1/2: 4.41 cm2
Calc EF: 41.4 %
S' Lateral: 4.1 cm
Single Plane A2C EF: 42.4 %
Single Plane A4C EF: 37.3 %

## 2020-07-26 ENCOUNTER — Encounter: Payer: Self-pay | Admitting: Nurse Practitioner

## 2020-07-26 ENCOUNTER — Telehealth: Payer: Self-pay

## 2020-07-26 ENCOUNTER — Ambulatory Visit
Admission: RE | Admit: 2020-07-26 | Discharge: 2020-07-26 | Disposition: A | Discharge status: Home / Self Care | Payer: Medicare HMO | Source: Ambulatory Visit | Attending: Nurse Practitioner | Admitting: Nurse Practitioner

## 2020-07-26 ENCOUNTER — Telehealth: Payer: Self-pay | Admitting: *Deleted

## 2020-07-26 ENCOUNTER — Ambulatory Visit (INDEPENDENT_AMBULATORY_CARE_PROVIDER_SITE_OTHER): Payer: Medicare HMO | Admitting: Nurse Practitioner

## 2020-07-26 VITALS — BP 125/76 | HR 94 | Temp 98.4°F

## 2020-07-26 DIAGNOSIS — F324 Major depressive disorder, single episode, in partial remission: Secondary | ICD-10-CM | POA: Diagnosis not present

## 2020-07-26 DIAGNOSIS — G894 Chronic pain syndrome: Secondary | ICD-10-CM

## 2020-07-26 DIAGNOSIS — G4733 Obstructive sleep apnea (adult) (pediatric): Secondary | ICD-10-CM

## 2020-07-26 DIAGNOSIS — E785 Hyperlipidemia, unspecified: Secondary | ICD-10-CM | POA: Diagnosis not present

## 2020-07-26 DIAGNOSIS — E1169 Type 2 diabetes mellitus with other specified complication: Secondary | ICD-10-CM

## 2020-07-26 DIAGNOSIS — I700 Atherosclerosis of aorta: Secondary | ICD-10-CM

## 2020-07-26 DIAGNOSIS — E6609 Other obesity due to excess calories: Secondary | ICD-10-CM

## 2020-07-26 DIAGNOSIS — E538 Deficiency of other specified B group vitamins: Secondary | ICD-10-CM | POA: Diagnosis not present

## 2020-07-26 DIAGNOSIS — J449 Chronic obstructive pulmonary disease, unspecified: Secondary | ICD-10-CM | POA: Diagnosis not present

## 2020-07-26 DIAGNOSIS — I152 Hypertension secondary to endocrine disorders: Secondary | ICD-10-CM | POA: Diagnosis not present

## 2020-07-26 DIAGNOSIS — Z978 Presence of other specified devices: Secondary | ICD-10-CM

## 2020-07-26 DIAGNOSIS — E1140 Type 2 diabetes mellitus with diabetic neuropathy, unspecified: Secondary | ICD-10-CM

## 2020-07-26 DIAGNOSIS — M25562 Pain in left knee: Secondary | ICD-10-CM | POA: Insufficient documentation

## 2020-07-26 DIAGNOSIS — E1159 Type 2 diabetes mellitus with other circulatory complications: Secondary | ICD-10-CM

## 2020-07-26 DIAGNOSIS — E119 Type 2 diabetes mellitus without complications: Secondary | ICD-10-CM

## 2020-07-26 DIAGNOSIS — Z6830 Body mass index (BMI) 30.0-30.9, adult: Secondary | ICD-10-CM

## 2020-07-26 DIAGNOSIS — R600 Localized edema: Secondary | ICD-10-CM | POA: Diagnosis not present

## 2020-07-26 DIAGNOSIS — E66811 Obesity, class 1: Secondary | ICD-10-CM

## 2020-07-26 DIAGNOSIS — Z23 Encounter for immunization: Secondary | ICD-10-CM | POA: Diagnosis not present

## 2020-07-26 DIAGNOSIS — Z794 Long term (current) use of insulin: Secondary | ICD-10-CM

## 2020-07-26 DIAGNOSIS — Z86711 Personal history of pulmonary embolism: Secondary | ICD-10-CM

## 2020-07-26 LAB — BAYER DCA HB A1C WAIVED: HB A1C (BAYER DCA - WAIVED): 7.1 % — ABNORMAL HIGH (ref <7.0)

## 2020-07-26 MED ORDER — CYANOCOBALAMIN 1000 MCG/ML IJ SOLN
1000.0000 ug | Freq: Once | INTRAMUSCULAR | Status: AC
  Administered 2020-07-26: 1000 ug via INTRAMUSCULAR

## 2020-07-26 MED ORDER — TRUE METRIX BLOOD GLUCOSE TEST VI STRP: ORAL_STRIP | 4 refills | Status: DC

## 2020-07-26 NOTE — Assessment & Plan Note (Signed)
Noted on CT 12/11/19.  Continue daily statin and Eliquis.  Monitor closely for symptoms.

## 2020-07-26 NOTE — Assessment & Plan Note (Signed)
Continue to collaborate with chronic pain clinic in [place].  Has morphine pump.   

## 2020-07-26 NOTE — Assessment & Plan Note (Addendum)
Chronic, stable.  Denies SI/HI.  Mood well-controlled.  Continue current medication regimen and adjust as needed.  Refills as needed.  Consider trial of Duloxetine and transition off Effexor in future for dual pain and mood benefit.   

## 2020-07-26 NOTE — Progress Notes (Signed)
BP 125/76    Pulse 94    Temp 98.4 F (36.9 C)    LMP  (LMP Unknown)    SpO2 96%    Subjective:    [name], female    DOB: Jan 22, 1951, 69 y.o.   MRN: [id number]  HPI: Jordan Chan is a 69 y.o. female  Chief Complaint  Patient presents with   COPD   Diabetes   Hypertension   Hyperlipidemia   Knee Pain   Depression   DIABETES On Trulicity, taking 3 MG weekly.  Continues on Metformin 1000 MG BID and Tresiba 20 units at HS.  Working with CCM team and provider. Recent A1C in Aug 7.3%.  She also continues on monthly B12 shots for low B12 levels.  Hypoglycemic episodes: none Polydipsia/polyuria: no Visual disturbance: no Chest pain: no Paresthesias: no Glucose Monitoring: yes  Accucheck frequency: BID  Fasting glucose: 110 to 130 range  Post prandial: after lunch 150 to 280 -- sometimes has her sweet tea  Evening:   Before meals: Taking Insulin?: yes  Long acting insulin: Tresiba 20 units daily  Short acting insulin: Blood Pressure Monitoring: just got cuff yesterday Retinal Examination: Up to Date Foot Exam: Up to Date Pneumovax: Up to Date Influenza: Up to Date Aspirin: yes   HYPERTENSION / HYPERLIPIDEMIA Continues on Atorvastatin 20 MG, Lisinopril 5 MG, Metoprolol 12.5 MG daily + ASA. Recent labs with LDL 36 and TCHOL 127. Satisfied with current treatment? yes Duration of hypertension: chronic BP monitoring frequency: a few times a week BP range:  120-130/60-70 BP medication side effects: no Duration of hyperlipidemia: chronic Cholesterol medication side effects: no Cholesterol supplements: none Medication compliance: good compliance Aspirin: yes Recent stressors: no Recurrent headaches: no Visual changes: no Palpitations: no Dyspnea: no Chest pain: no Lower extremity edema: no Dizzy/lightheaded: no   COPD Followed by pulmonary for PE and COPD -- last saw 07/16/20.  Currently using Trelegy.  Has a history of smoking,  quit 15 years ago. She does not use CPAP, was taken off O2 during daytime at recent pulmonary visit, but continues to use at night.  Is to stay on Apixaban for 3 - 6 months == would place 03/28/20 or 06/28/20 -- pulmonary plans to leave on it until January. COPD status: controlled Satisfied with current treatment?: yes Oxygen use: no Dyspnea frequency: none Cough frequency: none Rescue inhaler frequency:  minimal Limitation of activity: no Productive cough: none Last Spirometry: with pulmonary Pneumovax: Up to Date Influenza: Up to Date  CHRONIC PAIN WITH NEW ONSET LEFT KNEE PAIN: Followed by [facility] in [place], last seen 06/05/2020 -- continues pump + Gabapentin.  Continues to be followed by clinic nurse for pump check and replacement. She states her pain is well-controlled with current regimen.  Vitamin D on low side (17.5) in June, taking supplement with recent level improved at 31.7.  No recent falls or fractures.  Normal DEXA 2017.  She reports today some leg pain in the back of knee, left side, started one month ago. Duration: acute onset new knee pain, left Involved knee: left Mechanism of injury: unknown Location:posterior Onset: sudden Severity: 10/10 at worst when walking Quality:  dull and aching, throbbing Frequency: intermittent Radiation: no Aggravating factors: walking and movement  Alleviating factors: rest  Status: fluctuating Treatments attempted: rest and knee brace Relief with NSAIDs?:  no Weakness with weight bearing or walking: no Sensation of giving way: no Locking: no Popping: no Bruising: no Swelling:  no Redness: no Paresthesias/decreased sensation: no Fevers: no  DEPRESSION Continues on Effexor 150 MG daily. Mood status: controlled Satisfied with current treatment?: yes Symptom severity: mild  Duration of current treatment : chronic Side effects: no Medication compliance: good compliance Psychotherapy/counseling: has gone in the  past Depressed mood: yes Anxious mood: no Anhedonia: no Significant weight loss or gain: no Insomnia: none Fatigue: no Feelings of worthlessness or guilt: no Impaired concentration/indecisiveness: no Suicidal ideations: no Hopelessness: no Crying spells: no Depression screen [facility] 2/9 07/26/2020 03/28/2020 12/23/2019 11/15/2019 08/01/2019  Decreased Interest 0 0 0 0 0  Down, Depressed, Hopeless 0 0 0 3 1  PHQ - 2 Score 0 0 0 3 1  Altered sleeping 0 3 3 3 3   Tired, decreased energy 0 3 3 2 3   Change in appetite 0 1 0 0 0  Feeling bad or failure about yourself  0 0 0 0 0  Trouble concentrating 0 1 3 1  0  Moving slowly or fidgety/restless 0 0 0 0 0  Suicidal thoughts 0 0 0 0 0  PHQ-9 Score 0 8 9 9 7   Difficult doing work/chores Not difficult at all Not difficult at all Not difficult at all Somewhat difficult -  Some recent data might be hidden    Relevant past medical, surgical, family and social history reviewed and updated as indicated. Interim medical history since our last visit reviewed. Allergies and medications reviewed and updated.  Review of Systems  Constitutional: Negative for activity change, appetite change, diaphoresis, fatigue and fever.  Respiratory: Negative for cough, chest tightness and shortness of breath.   Cardiovascular: Negative for chest pain, palpitations and leg swelling.  Gastrointestinal: Negative.   Endocrine: Negative for heat intolerance, polydipsia, polyphagia and polyuria.  Neurological: Negative.   Psychiatric/Behavioral: Negative.     Per HPI unless specifically indicated above     Objective:    BP 125/76    Pulse 94    Temp 98.4 F (36.9 C)    LMP  (LMP Unknown)    SpO2 96%   Wt Readings from Last 3 Encounters:  07/04/20 140 lb (63.5 kg)  03/28/20 138 lb 6.4 oz (62.8 kg)  12/30/19 137 lb (62.1 kg)    Physical Exam Vitals and nursing note reviewed.  Constitutional:      General: She is awake. She is not in acute distress.     Appearance: She is well-developed and well-groomed. She is not ill-appearing.  HENT:     Head: Normocephalic.     Right Ear: Hearing normal.     Left Ear: Hearing normal.  Eyes:     General: Lids are normal.        Right eye: No discharge.        Left eye: No discharge.     Conjunctiva/sclera: Conjunctivae normal.     Pupils: Pupils are equal, round, and reactive to light.  Neck:     Thyroid: No thyromegaly.     Vascular: No carotid bruit.  Cardiovascular:     Rate and Rhythm: Normal rate and regular rhythm.     Heart sounds: Normal heart sounds. No murmur heard. No gallop.   Pulmonary:     Effort: Pulmonary effort is normal. No accessory muscle usage or respiratory distress.     Breath sounds: Normal breath sounds.     Comments:   Abdominal:     General: Bowel sounds are normal.     Palpations: Abdomen is soft.  Musculoskeletal:  Cervical back: Normal range of motion and neck supple.     Right knee: Normal.     Left knee: No swelling, erythema, ecchymosis, bony tenderness or crepitus. Normal range of motion. Tenderness (posterior knee) present. Normal pulse.     Right lower leg: No edema.     Left lower leg: No edema.     Comments: No edema, erythema, or warmth to left lower extremity.  Normal pulses.  Negative Homans.  Small cyst-like area noted to posterior left knee, tender to touch.  Lymphadenopathy:     Cervical: No cervical adenopathy.  Skin:    General: Skin is warm and dry.  Neurological:     Mental Status: She is alert and oriented to person, place, and time.  Psychiatric:        Attention and Perception: Attention normal.        Mood and Affect: Mood normal.        Speech: Speech normal.        Behavior: Behavior normal. Behavior is cooperative.        Thought Content: Thought content normal.     Results for orders placed or performed in visit on 07/26/20  Bayer DCA Hb A1c Waived  Result Value Ref Range   HB A1C (BAYER DCA - WAIVED) 7.1 (H) <7.0 %       Assessment & Plan:   Problem List Items Addressed This Visit      Cardiovascular and Mediastinum   Hypertension associated with diabetes ([place])    Chronic, stable with BP below goal on visit today and on home readings.  Recommend she continue to monitor BP at home regularly.  Focus on DASH diet.  Continue current medication regimen and adjust as needed.  BMP and TSH today.  Return in 3 months.       Relevant Orders   Basic metabolic panel   Bayer DCA Hb A1c Waived (Completed)   CBC with Differential/Platelet   TSH   Aortic atherosclerosis ([place])    Noted on CT 12/11/19.  Continue daily statin and Eliquis.  Monitor closely for symptoms.        Respiratory   Sleep apnea    Poor tolerance of CPAP mask, uses O2 night 2.5 L [place].      COPD, moderate (HCC)    Chronic, ongoing.  Continue Trelegy which offers benefit of medication minimization and has improved symptoms overall.  Continue to collaborate with [name] and review notes.  No current daytime O2.        Endocrine   Hyperlipidemia associated with type 2 diabetes mellitus (HCC)    Chronic, stable.  Continue current medication regimen and adjust as needed.  Lipid panel today.      Relevant Orders   Bayer DCA Hb A1c Waived (Completed)   Lipid Panel w/o Chol/HDL Ratio   Type 2 diabetes, controlled, with neuropathy (HCC) - Primary    Chronic, ongoing, insulin dependent. A1C 7.1% today, downward trend.  Reassured patient.  Will continue Trulicity 3 MG weekly + Metformin and Tyler Aas continue current doses at this time.  Recommend she continue to monitor BS consistently at home and document + focus heavily on diet changes.  Could consider increase in Trulicity next visit if elevation noted, 4.5 MG weekly.  Continue Gabapentin for neuropathy pain and B12 injections.  Return in 3 months.       Relevant Orders   Basic metabolic panel   Bayer DCA Hb A1c Waived (Completed)   Insulin  dependent type 2 diabetes mellitus (HCC)     Chronic, ongoing, insulin dependent. A1C 7.1% today, downward trend.  Reassured patient.  Will continue Trulicity at current dose, 3 MG weekly, and Metformin and Tyler Aas continue current dose at this time.  Recommend she continue to monitor BS consistently at home and document + focus heavily on diet changes.  Could consider increase in Trulicity next visit if elevation noted, 4.5 MG weekly.  Return in 3 months.         Other   Presence of intrathecal pump (Chronic)    Continue to collaborate with pain clinic in [facility] who monitors this.      Obesity    Recommended eating smaller high protein, low fat meals more frequently and exercising 30 mins a day 5 times a week with a goal of 10-15lb weight loss in the next 3 months. Patient voiced their understanding and motivation to adhere to these recommendations.       Depression, major, single episode, in partial remission (HCC)    Chronic, stable.  Denies SI/HI.  Mood well-controlled.  Continue current medication regimen and adjust as needed.  Refills as needed.  Consider trial of Duloxetine and transition off Effexor in future for dual pain and mood benefit.      B12 deficiency    Check B12 level next visit and continue injections.      Chronic pain syndrome    Continue to collaborate with chronic pain clinic in [facility].  Has morphine pump.        History of pulmonary embolus (PE)    Acute, currently being followed by pulmonary.  Continue Eliquis as prescribed by them, on review to continue for 6 months (January has visit and this may be discontinued).  Continue collaboration with pulmonary.      Acute pain of left knee    Suspect Baker's cyst, however due to recent history will obtain ultrasound to ensure no DVT.  Is on Eliquis for PE.  Referral to ortho placed for further evaluation and recommendations.  Recommend she take Tylenol as needed, apply heat/ice, and wear knee support.      Relevant Orders   US Venous Img  Lower Unilateral Left (DVT)   Ambulatory referral to Orthopedics    Other Visit Diagnoses    Immunization due       Flu vaccine today   Relevant Orders   Flu Vaccine QUAD High Dose(Fluad) (Completed)       Follow up plan: Return in about 3 months (around 10/24/2020) for T2Dm, HTN/HLD, COPD, PAIN, DEPRESSION.

## 2020-07-26 NOTE — Assessment & Plan Note (Signed)
Poor tolerance of CPAP mask, uses O2 night 2.5 L [place].

## 2020-07-26 NOTE — Assessment & Plan Note (Addendum)
Acute, currently being followed by pulmonary.  Continue Eliquis as prescribed by them, on review to continue for 6 months (January has visit and this may be discontinued).  Continue collaboration with pulmonary.

## 2020-07-26 NOTE — Assessment & Plan Note (Signed)
Suspect Baker's cyst, however due to recent history will obtain ultrasound to ensure no DVT.  Is on Eliquis for PE.  Referral to ortho placed for further evaluation and recommendations.  Recommend she take Tylenol as needed, apply heat/ice, and wear knee support.

## 2020-07-26 NOTE — Telephone Encounter (Signed)
Kim with [facility] called Ultrasound results on pt. - negative for DVT. Jolene [name] notified per phone.

## 2020-07-26 NOTE — Assessment & Plan Note (Signed)
Chronic, ongoing, insulin dependent. A1C 7.1% today, downward trend.  Reassured patient.  Will continue Trulicity at current dose, 3 MG weekly, and Metformin and Tyler Aas continue current dose at this time.  Recommend she continue to monitor BS consistently at home and document + focus heavily on diet changes.  Could consider increase in Trulicity next visit if elevation noted, 4.5 MG weekly.  Return in 3 months.

## 2020-07-26 NOTE — Telephone Encounter (Signed)
-----   Message from [name], MD sent at 07/25/2020  7:19 PM EST ----- Please let Ms. Kellenberger know that her echocardiogram shows mild reduction in her heart's pumping function.  I recommend that she continue her current medications and follow-up at her earliest convenience to reassess her symptoms and discuss need for additional medication changes to help strengthen her heart.

## 2020-07-26 NOTE — Assessment & Plan Note (Signed)
Chronic, ongoing, insulin dependent. A1C 7.1% today, downward trend.  Reassured patient.  Will continue Trulicity 3 MG weekly + Metformin and Tyler Aas continue current doses at this time.  Recommend she continue to monitor BS consistently at home and document + focus heavily on diet changes.  Could consider increase in Trulicity next visit if elevation noted, 4.5 MG weekly.  Continue Gabapentin for neuropathy pain and B12 injections.  Return in 3 months.

## 2020-07-26 NOTE — Telephone Encounter (Signed)
No answer. Left message to call back.

## 2020-07-26 NOTE — Assessment & Plan Note (Signed)
Chronic, ongoing.  Continue Trelegy which offers benefit of medication minimization and has improved symptoms overall.  Continue to collaborate with Dr. Fleming and review notes.  No current daytime O2. 

## 2020-07-26 NOTE — Assessment & Plan Note (Signed)
Chronic, stable with BP below goal on visit today and on home readings.  Recommend she continue to monitor BP at home regularly.  Focus on DASH diet.  Continue current medication regimen and adjust as needed.  BMP and TSH today.  Return in 3 months.

## 2020-07-26 NOTE — Assessment & Plan Note (Signed)
Chronic, stable.  Continue current medication regimen and adjust as needed.  Lipid panel today. 

## 2020-07-26 NOTE — Assessment & Plan Note (Signed)
Continue to collaborate with pain clinic in [place] who monitors this. 

## 2020-07-26 NOTE — Assessment & Plan Note (Signed)
Recommended eating smaller high protein, low fat meals more frequently and exercising 30 mins a day 5 times a week with a goal of 10-15lb weight loss in the next 3 months. Patient voiced their understanding and motivation to adhere to these recommendations.  

## 2020-07-26 NOTE — Patient Instructions (Signed)
Baker Cyst ° °A Baker cyst, also called a popliteal cyst, is a growth that forms at the back of the knee. The cyst forms when the fluid-filled sac (bursa) that cushions the knee joint becomes enlarged. °What are the causes? °In most cases, a Baker cyst results from another knee problem that causes swelling inside the knee. This makes the fluid inside the knee joint (synovial fluid) flow into the bursa behind the knee, causing the bursa to enlarge. °What increases the risk? °You may be more likely to develop a Baker cyst if you already have a knee problem, such as: °· A tear in cartilage that cushions the knee joint (meniscal tear). °· A tear in the tissues that connect the bones of the knee joint (ligament tear). °· Knee swelling from osteoarthritis, rheumatoid arthritis, or gout. °What are the signs or symptoms? °The main symptom of this condition is a lump behind the knee. This may be the only symptom of the condition. The lump may be painful, especially when the knee is straightened. If the lump is painful, the pain may come and go. The knee may also be stiff. °Symptoms may quickly get more severe if the cyst breaks open (ruptures). If the cyst ruptures, you may feel the following in your knee and calf: °· Sudden or worsening pain. °· Swelling. °· Bruising. °· Redness in the calf. °A Baker cyst does not always cause symptoms. °How is this diagnosed? °This condition may be diagnosed based on your symptoms and medical history. Your health care provider will also do a physical exam. This may include: °· Feeling the cyst to check whether it is tender. °· Checking your knee for signs of another knee condition that causes swelling. °You may have imaging tests, such as: °· X-rays. °· MRI. °· Ultrasound. °How is this treated? °A Baker cyst that is not painful may go away without treatment. If the cyst gets large or painful, it will likely get better if the underlying knee problem is treated. °If needed, treatment for a  Baker cyst may include: °· Resting. °· Keeping weight off of the knee. This means not leaning on the knee to support your body weight. °· Taking NSAIDs, such as ibuprofen, to reduce pain and swelling. °· Having a procedure to drain the fluid from the cyst with a needle (aspiration). You may also get an injection of a medicine that reduces swelling (steroid). °· Having surgery. This may be needed if other treatments do not work. This usually involves correcting knee damage and removing the cyst. °Follow these instructions at home: ° °Activity °· Rest as told by your health care provider. °· Avoid activities that make pain or swelling worse. °· Return to your normal activities as told by your health care provider. Ask your health care provider what activities are safe for you. °· Do not use the injured limb to support your body weight until your health care provider says that you can. Use crutches as told by your health care provider. °General instructions °· Take over-the-counter and prescription medicines only as told by your health care provider. °· Keep all follow-up visits as told by your health care provider. This is important. °Contact a health care provider if: °· You have knee pain, stiffness, or swelling that does not get better. °Get help right away if: °· You have sudden or worsening pain and swelling in your calf area. °Summary °· A Baker cyst, also called a popliteal cyst, is a growth that forms at the   back of the knee. °· In most cases, a Baker cyst results from another knee problem that causes swelling inside the knee. °· A Baker cyst that is not painful may go away without treatment. °· If needed, treatment for a Baker cyst may include resting, keeping weight off of the knee, medicines, or draining fluid from the cyst. °· Surgery may be needed if other treatments are not effective. °This information is not intended to replace advice given to you by your health care provider. Make sure you discuss any  questions you have with your health care provider. °Document Revised: 12/03/2018 Document Reviewed: 12/03/2018 °Elsevier Patient Education © 2020 [employer] ° ° ° °

## 2020-07-26 NOTE — Progress Notes (Signed)
Spoke to patient on telephone, she is aware that imaging was negative for DVT and she reports no Baker's cyst noted.  Recommend continue at home treatment and we will get her into ortho for further assessment.

## 2020-07-26 NOTE — Assessment & Plan Note (Signed)
Check B12 level next visit and continue injections.

## 2020-07-27 LAB — CBC WITH DIFFERENTIAL/PLATELET
Basophils Absolute: 0.1 10*3/uL (ref 0.0–0.2)
Basos: 1 %
EOS (ABSOLUTE): 0.5 10*3/uL — ABNORMAL HIGH (ref 0.0–0.4)
Eos: 4 %
Hematocrit: 35.7 % (ref 34.0–46.6)
Hemoglobin: 11.6 g/dL (ref 11.1–15.9)
Immature Grans (Abs): 0 10*3/uL (ref 0.0–0.1)
Immature Granulocytes: 0 %
Lymphocytes Absolute: 3.2 10*3/uL — ABNORMAL HIGH (ref 0.7–3.1)
Lymphs: 26 %
MCH: 29.5 pg (ref 26.6–33.0)
MCHC: 32.5 g/dL (ref 31.5–35.7)
MCV: 91 fL (ref 79–97)
Monocytes Absolute: 1 10*3/uL — ABNORMAL HIGH (ref 0.1–0.9)
Monocytes: 8 %
Neutrophils Absolute: 7.5 10*3/uL — ABNORMAL HIGH (ref 1.4–7.0)
Neutrophils: 61 %
Platelets: 433 10*3/uL (ref 150–450)
RBC: 3.93 x10E6/uL (ref 3.77–5.28)
RDW: 12.4 % (ref 11.7–15.4)
WBC: 12.3 10*3/uL — ABNORMAL HIGH (ref 3.4–10.8)

## 2020-07-27 LAB — BASIC METABOLIC PANEL
BUN/Creatinine Ratio: 17 (ref 12–28)
BUN: 13 mg/dL (ref 8–27)
CO2: 22 mmol/L (ref 20–29)
Calcium: 9.8 mg/dL (ref 8.7–10.3)
Chloride: 99 mmol/L (ref 96–106)
Creatinine, Ser: 0.76 mg/dL (ref 0.57–1.00)
GFR calc Af Amer: 92 mL/min/{1.73_m2} (ref >59)
GFR calc non Af Amer: 80 mL/min/{1.73_m2} (ref >59)
Glucose: 153 mg/dL — ABNORMAL HIGH (ref 65–99)
Potassium: 4.9 mmol/L (ref 3.5–5.2)
Sodium: 138 mmol/L (ref 134–144)

## 2020-07-27 LAB — LIPID PANEL W/O CHOL/HDL RATIO
Cholesterol, Total: 154 mg/dL (ref 100–199)
HDL: 73 mg/dL (ref >39)
LDL Chol Calc (NIH): 63 mg/dL (ref 0–99)
Triglycerides: 100 mg/dL (ref 0–149)
VLDL Cholesterol Cal: 18 mg/dL (ref 5–40)

## 2020-07-27 LAB — TSH: TSH: 3.1 u[IU]/mL (ref 0.450–4.500)

## 2020-07-28 ENCOUNTER — Other Ambulatory Visit: Payer: Self-pay | Admitting: Nurse Practitioner

## 2020-07-28 DIAGNOSIS — D72820 Lymphocytosis (symptomatic): Secondary | ICD-10-CM

## 2020-07-28 NOTE — Progress Notes (Signed)
Good morning, please let Jordan Chan know her labs have returned.  Kidney function remains stable.  CBC continues to show mild elevation in white blood cell count, but this is trending down from last check.  I would like to recheck this in 4 weeks via an outpatient lab visit to ensure this continues to trend downward.  Please schedule a lab visit.  Have a wonderful day!! Keep being awesome!!  Thank you for allowing me to participate in your care. Kindest regards, Duwayne Matters

## 2020-07-30 NOTE — Telephone Encounter (Signed)
Patient calling and would like to go over results.

## 2020-07-30 NOTE — Telephone Encounter (Signed)
Called pt back regarding her missed call for her echo results, End, MD had a chance to review results and advised "echocardiogram shows mild reduction in her heart's pumping function.  I recommend that she continue her current medications and follow-up at her earliest convenience to reassess her symptoms and discuss need for additional medication changes to help strengthen her heart."  Pt verbalized understanding and will stick with her current mediations, has a f/u visit 01/02/2021, but will call to make a sooner appt for reassessment of her symptoms if they are getting worse and to discuss medication changes if needed. Otherwise all questions or concerns were address and no additional concerns at this time. Agreeable to plan, will call back for anything further.

## 2020-08-05 DIAGNOSIS — J45909 Unspecified asthma, uncomplicated: Secondary | ICD-10-CM | POA: Diagnosis not present

## 2020-08-05 DIAGNOSIS — J449 Chronic obstructive pulmonary disease, unspecified: Secondary | ICD-10-CM | POA: Diagnosis not present

## 2020-08-06 DIAGNOSIS — M1712 Unilateral primary osteoarthritis, left knee: Secondary | ICD-10-CM | POA: Diagnosis not present

## 2020-08-09 DIAGNOSIS — G893 Neoplasm related pain (acute) (chronic): Secondary | ICD-10-CM | POA: Diagnosis not present

## 2020-08-09 DIAGNOSIS — M792 Neuralgia and neuritis, unspecified: Secondary | ICD-10-CM | POA: Diagnosis not present

## 2020-08-09 DIAGNOSIS — Z5181 Encounter for therapeutic drug level monitoring: Secondary | ICD-10-CM | POA: Diagnosis not present

## 2020-08-09 DIAGNOSIS — Z978 Presence of other specified devices: Secondary | ICD-10-CM | POA: Diagnosis not present

## 2020-08-09 DIAGNOSIS — M47816 Spondylosis without myelopathy or radiculopathy, lumbar region: Secondary | ICD-10-CM | POA: Diagnosis not present

## 2020-08-09 DIAGNOSIS — Z79899 Other long term (current) drug therapy: Secondary | ICD-10-CM | POA: Diagnosis not present

## 2020-08-09 DIAGNOSIS — M542 Cervicalgia: Secondary | ICD-10-CM | POA: Diagnosis not present

## 2020-08-09 DIAGNOSIS — M5442 Lumbago with sciatica, left side: Secondary | ICD-10-CM | POA: Diagnosis not present

## 2020-08-09 DIAGNOSIS — G8929 Other chronic pain: Secondary | ICD-10-CM | POA: Diagnosis not present

## 2020-08-09 DIAGNOSIS — G894 Chronic pain syndrome: Secondary | ICD-10-CM | POA: Diagnosis not present

## 2020-08-15 ENCOUNTER — Other Ambulatory Visit: Payer: Self-pay | Admitting: Nurse Practitioner

## 2020-08-15 DIAGNOSIS — S8392XA Sprain of unspecified site of left knee, initial encounter: Secondary | ICD-10-CM | POA: Diagnosis not present

## 2020-08-17 ENCOUNTER — Other Ambulatory Visit: Payer: Self-pay | Admitting: Nurse Practitioner

## 2020-08-21 ENCOUNTER — Other Ambulatory Visit: Payer: Self-pay | Admitting: Nurse Practitioner

## 2020-08-22 NOTE — Telephone Encounter (Signed)
Requested medication (s) are due for refill today:   Not sure  Requested medication (s) are on the active medication list:   Yes  Future visit scheduled:   Yes   Last ordered: 06/18/2020 1 kit with no refills ordered.   True Metrix blood glucosemeter with device Kit.    Didn't know if this needed to be requested again.    Thanks   Requested Prescriptions  Pending Prescriptions Disp Refills   Blood Glucose Monitoring Suppl (TRUE METRIX METER) w/Device KIT [Pharmacy Med Name: TRUE METRIX BLOOD GLUCOSEMETER w/Device  Kit] 1 kit 0    Sig: USE AS DIRECTED      Endocrinology: Diabetes - Testing Supplies Passed - 08/21/2020  6:09 PM      Passed - Valid encounter within last 12 months    Recent Outpatient Visits           3 weeks ago Type 2 diabetes, controlled, with neuropathy ([place])   [place], Whitlock T, NP   4 months ago Suspected COVID-19 virus infection   [employer], Megan P, DO   4 months ago Type 2 diabetes mellitus with diabetic neuropathy, with long-term current use of insulin ([place])   [place], Jolene T, NP   7 months ago Acute pulmonary embolism without acute cor pulmonale, unspecified pulmonary embolism type ([place])   [place], Henrine Screws T, NP   8 months ago Community acquired pneumonia of right lower lobe of lung   [place], [name], NP       Future Appointments             In 2 months [name], [name], NP [employer]   In 3 months  [employer], [place]   In 4 months End, Harrell Gave, MD [place], [place]

## 2020-08-27 DIAGNOSIS — S8392XD Sprain of unspecified site of left knee, subsequent encounter: Secondary | ICD-10-CM | POA: Diagnosis not present

## 2020-08-28 ENCOUNTER — Other Ambulatory Visit: Payer: Self-pay

## 2020-08-28 ENCOUNTER — Other Ambulatory Visit: Payer: Medicare HMO

## 2020-08-28 DIAGNOSIS — D72820 Lymphocytosis (symptomatic): Secondary | ICD-10-CM | POA: Diagnosis not present

## 2020-08-29 DIAGNOSIS — J449 Chronic obstructive pulmonary disease, unspecified: Secondary | ICD-10-CM | POA: Diagnosis not present

## 2020-08-29 LAB — CBC WITH DIFFERENTIAL/PLATELET
Basophils Absolute: 0.1 10*3/uL (ref 0.0–0.2)
Basos: 1 %
EOS (ABSOLUTE): 0.7 10*3/uL — ABNORMAL HIGH (ref 0.0–0.4)
Eos: 7 %
Hematocrit: 35.8 % (ref 34.0–46.6)
Hemoglobin: 11.3 g/dL (ref 11.1–15.9)
Immature Grans (Abs): 0 10*3/uL (ref 0.0–0.1)
Immature Granulocytes: 0 %
Lymphocytes Absolute: 3.5 10*3/uL — ABNORMAL HIGH (ref 0.7–3.1)
Lymphs: 33 %
MCH: 28 pg (ref 26.6–33.0)
MCHC: 31.6 g/dL (ref 31.5–35.7)
MCV: 89 fL (ref 79–97)
Monocytes Absolute: 1 10*3/uL — ABNORMAL HIGH (ref 0.1–0.9)
Monocytes: 10 %
Neutrophils Absolute: 5.3 10*3/uL (ref 1.4–7.0)
Neutrophils: 49 %
Platelets: 453 10*3/uL — ABNORMAL HIGH (ref 150–450)
RBC: 4.03 x10E6/uL (ref 3.77–5.28)
RDW: 12.1 % (ref 11.7–15.4)
WBC: 10.8 10*3/uL (ref 3.4–10.8)

## 2020-08-29 NOTE — Progress Notes (Signed)
Good morning, please let Jordan Chan know her labs have returned.  CBC shows improved white blood cell count, this has trended down to normal range.  Platelets are mildly elevated again, very mild, we will continue to monitor this.  Overall much better labs.  If any questions let me know. Keep being amazing!!  Thank you for allowing me to participate in your care. Kindest regards, Jamal Haskin

## 2020-09-03 ENCOUNTER — Other Ambulatory Visit: Payer: Self-pay | Admitting: Nurse Practitioner

## 2020-09-05 DIAGNOSIS — J449 Chronic obstructive pulmonary disease, unspecified: Secondary | ICD-10-CM | POA: Diagnosis not present

## 2020-09-05 DIAGNOSIS — J45909 Unspecified asthma, uncomplicated: Secondary | ICD-10-CM | POA: Diagnosis not present

## 2020-09-11 ENCOUNTER — Ambulatory Visit (INDEPENDENT_AMBULATORY_CARE_PROVIDER_SITE_OTHER): Payer: Medicare HMO

## 2020-09-11 ENCOUNTER — Other Ambulatory Visit: Payer: Self-pay

## 2020-09-11 DIAGNOSIS — E538 Deficiency of other specified B group vitamins: Secondary | ICD-10-CM

## 2020-09-11 MED ORDER — CYANOCOBALAMIN 1000 MCG/ML IJ SOLN
1000.0000 ug | Freq: Once | INTRAMUSCULAR | Status: AC
  Administered 2020-09-11: 1000 ug via INTRAMUSCULAR

## 2020-09-21 ENCOUNTER — Encounter: Payer: Self-pay | Admitting: Nurse Practitioner

## 2020-09-27 ENCOUNTER — Other Ambulatory Visit: Payer: Self-pay | Admitting: Nurse Practitioner

## 2020-10-01 ENCOUNTER — Ambulatory Visit (INDEPENDENT_AMBULATORY_CARE_PROVIDER_SITE_OTHER): Payer: Medicare HMO | Admitting: Licensed Clinical Social Worker

## 2020-10-01 DIAGNOSIS — I152 Hypertension secondary to endocrine disorders: Secondary | ICD-10-CM

## 2020-10-01 DIAGNOSIS — F324 Major depressive disorder, single episode, in partial remission: Secondary | ICD-10-CM

## 2020-10-01 DIAGNOSIS — J449 Chronic obstructive pulmonary disease, unspecified: Secondary | ICD-10-CM | POA: Diagnosis not present

## 2020-10-01 DIAGNOSIS — E1159 Type 2 diabetes mellitus with other circulatory complications: Secondary | ICD-10-CM

## 2020-10-01 DIAGNOSIS — E1140 Type 2 diabetes mellitus with diabetic neuropathy, unspecified: Secondary | ICD-10-CM | POA: Diagnosis not present

## 2020-10-01 NOTE — Chronic Care Management (AMB) (Signed)
°Chronic Care Management  ° ° Clinical Social Work Note ° °10/01/2020 °Name: Jordan Chan MRN: [id number] DOB: 12/23/1950 ° °[name] is a 70 y.o. year old female who is a primary care patient of [name], Jolene T, NP. The CCM team was consulted to assist the patient with chronic disease management and/or care coordination needs related to: Mental Health Counseling and Resources.  ° °Engaged with patient by telephone for follow up visit in response to provider referral for social work chronic care management and care coordination services.  ° °Consent to Services:  °The patient was given the following information about Chronic Care Management services today, agreed to services, and gave verbal consent: 1. CCM service includes personalized support from designated clinical staff supervised by the primary care provider, including individualized plan of care and coordination with other care providers 2. 24/7 contact phone numbers for assistance for urgent and routine care needs. 3. Service will only be billed when office clinical staff spend 20 minutes or more in a month to coordinate care. 4. Only one practitioner may furnish and bill the service in a calendar month. 5.The patient may stop CCM services at any time (effective at the end of the month) by phone call to the office staff. 6. The patient will be responsible for cost sharing (co-pay) of up to 20% of the service fee (after annual deductible is met). Patient agreed to services and consent obtained. ° °Patient agreed to services and consent obtained.  ° °Assessment: Review of patient past medical history, allergies, medications, and health status, including review of relevant consultants reports was performed today as part of a comprehensive evaluation and provision of chronic care management and care coordination services.    ° °SDOH (Social Determinants of Health) assessments and interventions performed:   ° °Advanced Directives Status: See Care  Plan for related entries. ° °CCM Care Plan ° °Allergies  °Allergen Reactions  °• Other Palpitations  °  IV steroids  °• Pain Patch [Menthol] Anaphylaxis  °• Fentanyl   °• Avelox [Moxifloxacin Hcl In Nacl] Other (See Comments)  °  Upset stomach  °• Doxycycline Diarrhea  °• Erythromycin Nausea Only and Other (See Comments)  °  Can take a Z-Pak just fine °Other reaction(s): Other (See Comments) °Can take Z-Pak ° °Can take a Z-Pak just fine  °• Moxifloxacin Hcl Other (See Comments)  °  Upset stomach °Upset stomach  °• Oxycontin [Oxycodone] Hives  °• Ozempic [Semaglutide] Nausea Only  ° ° °Outpatient Encounter Medications as of 10/01/2020  °Medication Sig  °• Accu-Chek Softclix Lancets lancets TEST BLOOD SUGAR THREE TIMES DAILY  °• albuterol (PROVENTIL HFA;VENTOLIN HFA) 108 (90 BASE) MCG/ACT inhaler Inhale 2 puffs into the lungs every 6 (six) hours as needed for wheezing or shortness of breath.   °• Alcohol Swabs (B-D SINGLE USE SWABS REGULAR) PADS USE TWICE DAILY  WITH  SUGAR  CHECKS  °• apixaban (ELIQUIS) 5 MG TABS tablet Take 5 mg by mouth 2 (two) times daily.   °• atorvastatin (LIPITOR) 20 MG tablet TAKE 1 TABLET EVERY DAY  °• Blood Glucose Monitoring Suppl (TRUE METRIX METER) w/Device KIT To check blood sugar 4 times a day  °• cholecalciferol (VITAMIN D3) 25 MCG (1000 UNIT) tablet Take 1,000 Units by mouth daily.  °• Cyanocobalamin 1000 MCG/ML KIT Inject 1,000 mcg as directed every 30 (thirty) days.  °• cyclobenzaprine (FLEXERIL) 5 MG tablet Take 5 mg by mouth 2 (two) times daily as needed for muscle spasms. Takes very   rarely  °• diclofenac (VOLTAREN) 50 MG EC tablet TAKE 1 TABLET THREE TIMES DAILY  °• Dulaglutide (TRULICITY) 3 MG/0.5ML SOPN Inject 0.5 mLs (3 mg total) as directed once a week.  °• gabapentin (NEURONTIN) 800 MG tablet Take 1 tablet (800 mg total) by mouth 3 (three) times daily.  °• glucose blood (TRUE METRIX BLOOD GLUCOSE TEST) test strip To check blood sugar 3 times a day.  °• lactulose (CHRONULAC)  10 GM/15ML solution Take 45 mLs (30 g total) by mouth daily as needed for mild constipation or severe constipation.  °• lisinopril (ZESTRIL) 5 MG tablet TAKE 1 TABLET EVERY DAY  °• metFORMIN (GLUCOPHAGE) 1000 MG tablet TAKE 1 TABLET TWICE DAILY WITH MEALS  °• metoprolol succinate (TOPROL-XL) 25 MG 24 hr tablet TAKE 1/2 TABLET EVERY DAY  °• PAIN MANAGEMENT INTRATHECAL, IT, PUMP 1 each by Intrathecal route. Intrathecal (IT) medication:  Morphine °Patient does not remember current. Adjusted every 2.5 months at [facility] in [place].  °• TRELEGY ELLIPTA 100-62.5-25 MCG/INH AEPB INHALE 1 PUFF INTO THE LUNGS DAILY.  °• TRESIBA FLEXTOUCH 100 UNIT/ML FlexTouch Pen INJECT 30 UNITS INTO THE SKIN AT BEDTIME.  °• venlafaxine XR (EFFEXOR-XR) 150 MG 24 hr capsule Take 1 capsule (150 mg total) by mouth daily.  ° °No facility-administered encounter medications on file as of 10/01/2020.  ° ° °Patient Active Problem List  ° Diagnosis Date Noted  °• Acute pain of left knee 07/26/2020  °• Type 2 diabetes, controlled, with neuropathy (HCC) 07/20/2020  °• Insulin dependent type 2 diabetes mellitus (HCC) 07/20/2020  °• DOE (dyspnea on exertion) 07/04/2020  °• History of pulmonary embolus (PE) 07/04/2020  °• Aortic atherosclerosis (HCC) 07/01/2020  °• SVT (supraventricular tachycardia) (HCC)   °• Vitamin D deficiency 07/17/2019  °• Stress-induced cardiomyopathy 07/23/2017  °• History of non-ST elevation myocardial infarction (NSTEMI)   °• Presence of intrathecal pump 04/27/2017  °• Advanced care planning/counseling discussion 11/14/2016  °• Cervical scoliosis 05/20/2016  °• Hypertension associated with diabetes (HCC) 06/18/2015  °• B12 deficiency 03/19/2015  °• Sleep apnea 11/16/2014  °• Allergic rhinitis 11/16/2014  °• History of breast cancer 11/16/2014  °• GERD (gastroesophageal reflux disease) 11/16/2014  °• COPD, moderate (HCC) 11/16/2014  °• Obesity 11/16/2014  °• Depression, major, single episode, in partial  remission (HCC) 11/16/2014  °• Insomnia 11/16/2014  °• Hyperlipidemia associated with type 2 diabetes mellitus (HCC) 11/16/2014  °• History of sarcoma 09/27/2013  °• Chronic pain syndrome 09/30/2012  ° ° °Conditions to be addressed/monitored: Anxiety and Depression; Mental Health Concerns  ° °Care Plan : General Social Work (Adult)  °Updates made by ,  L, LCSW since 10/01/2020 12:00 AM  °  °Problem: Coping Skills (General Plan of Care)   °  °Long-Range Goal: Coping Skills Enhanced   °Start Date: 10/01/2020  °Priority: Medium  °Note:   °Timeframe:  Long-Range Goal °Priority:  Medium °Start Date: 10/01/20                        °Expected End Date:  12/29/20                   ° °Follow Up Date- 11/22/20 °  °- avoid negative self-talk °- develop a personal safety plan °- develop a plan to deal with triggers like holidays, anniversaries °- exercise at least 2 to 3 times per week °- have a plan for how to handle bad days °- journal feelings and what helps to   to feel better or worse - spend time or talk with others at least 2 to 3 times per week - spend time or talk with others every day - watch for early signs of feeling worse - write in journal every day    Why is this important?    Keeping track of your progress will help your treatment team find the right mix of medicine and therapy for you.   Write in your journal every day.   Day-to-day changes in depression symptoms are normal. It may be more helpful to check your progress at the end of each week instead of every day.     Current Barriers:   Financial constraints   Limited social support  Level of care concerns  ADL IADL limitations  Social Isolation  Depression  Clinical Social Work Clinical Goal(s):   Over the next 120 days, patient/caregiver will work with SW to address concerns related to lack of support/resource connection. LCSW will assist patient in gaining additional support/resource connection and community resource  education in order to maintain health and mental health appropriately    Over the next 120 days, patient will demonstrate improved adherence to self care as evidenced by implementing healthy self-care into her daily routine such as: attending all medical appointments, deep breathing exercises, taking time for self and self-reflection, taking medications as prescribed, drinking water and daily exercise to improve mobility.    Over the next 120 days, patient will demonstrate improved health management independence as evidenced by implementing healthy self-care skills and positive support/resources into her daily routine to help cope with stressors and improve overall physical and mental health and well-being   Interventions:  Patient interviewed and appropriate assessments performed  Patient reports being very stable and declines any urgent concerns or needs at this time.   Patient reports that her left leg is still weak and causes pain. She struggles with ambulating due to this concern but is still managing to go out often and socialize. Patient reports that she feels as if she is getting better everyday. Her support system includes: several friends, 1 sister, 4 grandchildren, 1 son, 1 daughter and 1 son in [employer]. Patient reports that she goes out to eat with her son and a close friend for dinner every other night. Patient reports that she enjoys time alone and is currently on her way to [employer] to eat lunch. Patient provides stable transportation for herself. She reports that she goes out to eat several times per week.   Patient feels her depression and insomnia have both improved. Patient reports having low motivation and energy but still manages to get up and out of the house daily.   Provided patient with information about available socialization opportunities within her nearby area. Patient was educated on [place] and [employer].   LCSW discussed coping skills for  depression management. SW used empathetic and active and reflective listening, validated patient's feelings/concerns, and provided emotional support. LCSW provided self-care education to help manage her multiple health conditions and improve her mood.   Discussed plans with patient for ongoing care management follow up and provided patient with direct contact information for care management team  Assisted patient/caregiver with obtaining information about health plan benefits  Provided education and assistance to client regarding Advanced Directives.  A voluntary and extensive discussion about advanced care planning including explanation and discussion of advanced was undertaken with the patient and family.  Explanation regarding healthcare proxy and living will was  reviewed and packet with forms with explanation of how to fill them out was given.    Provided education to patient/caregiver about Hospice and/or [place] [phone]) contact information provided to patient again today during 10/01/20. This program offers free check in's to elderly residents in [place] that lack a support network.   Patient reports implementing appropriate self-care habits into her daily routine such as: drinking water, staying active around the house, taking her medications as prescribed, combating negative thoughts or emotions and staying connected with her family and friends. Positive reinforcement provided.   Patient Self Care Activities:   Attends all scheduled provider appointments  Unable to independently manage healthcare expenses for self and spouse  Please see past updates related to this goal by clicking on the "Past Updates" button in the selected goal    Task: Support Psychosocial Response to Risk or Actual Health Condition   Note:   Care Management Activities:    - active listening utilized - counseling provided - current coping strategies identified - decision-making  supported - healthy lifestyle promoted - journaling promoted - meditative movement therapy encouraged - mindfulness encouraged - participation in counseling encouraged - problem-solving facilitated - relaxation techniques promoted - self-reflection promoted - spiritual activities promoted - verbalization of feelings encouraged    Notes:       Follow Up Plan: SW will follow up with patient by phone over the next quarter      Eula Fried, BSW, MSW, [email or web address] Phone: [phone]

## 2020-10-01 NOTE — Patient Instructions (Signed)
Licensed Clinical Social Worker Visit Information  Goals we discussed today:  Goals Addressed            This Visit's Progress   . SW-Track and Manage My Symptoms-Depression       Timeframe:  Long-Range Goal Priority:  Medium Start Date: 10/01/20                        Expected End Date:  12/29/20                    Follow Up Date- 11/22/20   - avoid negative self-talk - develop a personal safety plan - develop a plan to deal with triggers like holidays, anniversaries - exercise at least 2 to 3 times per week - have a plan for how to handle bad days - journal feelings and what helps to feel better or worse - spend time or talk with others at least 2 to 3 times per week - spend time or talk with others every day - watch for early signs of feeling worse - write in journal every day    Why is this important?    Keeping track of your progress will help your treatment team find the right mix of medicine and therapy for you.   Write in your journal every day.   Day-to-day changes in depression symptoms are normal. It may be more helpful to check your progress at the end of each week instead of every day.     Current Barriers:  . Financial constraints  . Limited social support . Level of care concerns . ADL IADL limitations . Social Isolation . Depression  Clinical Social Work Clinical Goal(s):  [name] Over the next 120 days, patient/caregiver will work with SW to address concerns related to lack of support/resource connection. LCSW will assist patient in gaining additional support/resource connection and community resource education in order to maintain health and mental health appropriately  .  Over the next 120 days, patient will demonstrate improved adherence to self care as evidenced by implementing healthy self-care into her daily routine such as: attending all medical appointments, deep breathing exercises, taking time for self and self-reflection, taking medications as  prescribed, drinking water and daily exercise to improve mobility.  .  Over the next 120 days, patient will demonstrate improved health management independence as evidenced by implementing healthy self-care skills and positive support/resources into her daily routine to help cope with stressors and improve overall physical and mental health and well-being   Interventions: . Patient interviewed and appropriate assessments performed . Patient reports being very stable and declines any urgent concerns or needs at this time.  . Patient reports that her left leg is still weak and causes pain. She struggles with ambulating due to this concern but is still managing to go out often and socialize. Patient reports that she feels as if she is getting better everyday. Her support system includes: several friends, 1 sister, 4 grandchildren, 1 son, 1 daughter and 1 son in [employer]. Patient reports that she goes out to eat with her son and a close friend for dinner every other night. Patient reports that she enjoys time alone and is currently on her way to [employer] to eat lunch. Patient provides stable transportation for herself. She reports that she goes out to eat several times per week.  . Patient feels her depression and insomnia have both improved. Patient reports having low motivation and energy  but still manages to get up and out of the house daily.  . Provided patient with information about available socialization opportunities within her nearby area. Patient was educated on [place] and [employer].  [name] LCSW discussed coping skills for depression management. SW used empathetic and active and reflective listening, validated patient's feelings/concerns, and provided emotional support. LCSW provided self-care education to help manage her multiple health conditions and improve her mood.  . Discussed plans with patient for ongoing care management follow up and provided patient with direct  contact information for care management team . Assisted patient/caregiver with obtaining information about health plan benefits . Provided education and assistance to client regarding Advanced Directives. . A voluntary and extensive discussion about advanced care planning including explanation and discussion of advanced was undertaken with the patient and family.  Explanation regarding healthcare proxy and living will was reviewed and packet with forms with explanation of how to fill them out was given.   . Provided education to patient/caregiver about Hospice and/or Palliative Care services . [facility] [phone]) contact information provided to patient again today during 10/01/20. This program offers free check in's to elderly residents in [place] that lack a support network.  . Patient reports implementing appropriate self-care habits into her daily routine such as: drinking water, staying active around the house, taking her medications as prescribed, combating negative thoughts or emotions and staying connected with her family and friends. Positive reinforcement provided.   Patient Self Care Activities:  . Attends all scheduled provider appointments . Unable to independently manage healthcare expenses for self and spouse  Please see past updates related to this goal by clicking on the "Past Updates" button in the selected goal

## 2020-10-02 DIAGNOSIS — N390 Urinary tract infection, site not specified: Secondary | ICD-10-CM | POA: Diagnosis not present

## 2020-10-03 DIAGNOSIS — J449 Chronic obstructive pulmonary disease, unspecified: Secondary | ICD-10-CM | POA: Diagnosis not present

## 2020-10-03 DIAGNOSIS — J45909 Unspecified asthma, uncomplicated: Secondary | ICD-10-CM | POA: Diagnosis not present

## 2020-10-05 ENCOUNTER — Ambulatory Visit (INDEPENDENT_AMBULATORY_CARE_PROVIDER_SITE_OTHER): Payer: Medicare HMO

## 2020-10-05 ENCOUNTER — Other Ambulatory Visit: Payer: Self-pay

## 2020-10-05 DIAGNOSIS — E538 Deficiency of other specified B group vitamins: Secondary | ICD-10-CM

## 2020-10-05 MED ORDER — CYANOCOBALAMIN 1000 MCG/ML IJ SOLN
1000.0000 ug | Freq: Once | INTRAMUSCULAR | Status: AC
  Administered 2020-10-05: 1000 ug via INTRAMUSCULAR

## 2020-10-08 DIAGNOSIS — Z9981 Dependence on supplemental oxygen: Secondary | ICD-10-CM | POA: Diagnosis not present

## 2020-10-08 DIAGNOSIS — J441 Chronic obstructive pulmonary disease with (acute) exacerbation: Secondary | ICD-10-CM | POA: Diagnosis not present

## 2020-10-08 DIAGNOSIS — R0600 Dyspnea, unspecified: Secondary | ICD-10-CM | POA: Diagnosis not present

## 2020-10-15 DIAGNOSIS — S8392XD Sprain of unspecified site of left knee, subsequent encounter: Secondary | ICD-10-CM | POA: Diagnosis not present

## 2020-10-18 DIAGNOSIS — G893 Neoplasm related pain (acute) (chronic): Secondary | ICD-10-CM | POA: Diagnosis not present

## 2020-10-18 DIAGNOSIS — G894 Chronic pain syndrome: Secondary | ICD-10-CM | POA: Diagnosis not present

## 2020-10-18 DIAGNOSIS — M5442 Lumbago with sciatica, left side: Secondary | ICD-10-CM | POA: Diagnosis not present

## 2020-10-18 DIAGNOSIS — G8929 Other chronic pain: Secondary | ICD-10-CM | POA: Diagnosis not present

## 2020-10-18 DIAGNOSIS — M792 Neuralgia and neuritis, unspecified: Secondary | ICD-10-CM | POA: Diagnosis not present

## 2020-10-22 ENCOUNTER — Telehealth: Payer: Self-pay

## 2020-10-22 MED ORDER — VENLAFAXINE HCL ER 150 MG PO CP24: 150.0000 mg | ORAL_CAPSULE | Freq: Every day | ORAL | 4 refills | Status: DC

## 2020-10-22 NOTE — Telephone Encounter (Signed)
Routing to provider  

## 2020-10-22 NOTE — Telephone Encounter (Signed)
Sent this refill in.

## 2020-10-22 NOTE — Telephone Encounter (Signed)
Copied from [place] [phone]. Topic: General - Other >> Oct 22, 2020  2:24 PM Erick Blinks wrote: Reason for CRM: Pt called to report that [facility] is faxing over a form regarding one of her medications

## 2020-10-22 NOTE — Telephone Encounter (Signed)
Pt states she is needing her venlafaxine XR (EFFEXOR-XR) 150 MG 24 hr capsule Re filled Pt states she was taking her sister prescription, because her sister was taking the same thing, and it was making her sister sick.  She she gave it to the pt.  So pt did not have to order this medication for a long time.  But now she is out and needing re filled from [facility]  90 day supply

## 2020-10-25 ENCOUNTER — Encounter: Payer: Self-pay | Admitting: Nurse Practitioner

## 2020-10-25 ENCOUNTER — Ambulatory Visit (INDEPENDENT_AMBULATORY_CARE_PROVIDER_SITE_OTHER): Payer: Medicare HMO | Admitting: Nurse Practitioner

## 2020-10-25 ENCOUNTER — Other Ambulatory Visit: Payer: Self-pay

## 2020-10-25 VITALS — BP 111/71 | HR 98 | Temp 98.5°F | Wt 156.2 lb

## 2020-10-25 DIAGNOSIS — Z794 Long term (current) use of insulin: Secondary | ICD-10-CM | POA: Diagnosis not present

## 2020-10-25 DIAGNOSIS — E119 Type 2 diabetes mellitus without complications: Secondary | ICD-10-CM

## 2020-10-25 DIAGNOSIS — I471 Supraventricular tachycardia, unspecified: Secondary | ICD-10-CM

## 2020-10-25 DIAGNOSIS — J449 Chronic obstructive pulmonary disease, unspecified: Secondary | ICD-10-CM | POA: Diagnosis not present

## 2020-10-25 DIAGNOSIS — E785 Hyperlipidemia, unspecified: Secondary | ICD-10-CM

## 2020-10-25 DIAGNOSIS — I152 Hypertension secondary to endocrine disorders: Secondary | ICD-10-CM

## 2020-10-25 DIAGNOSIS — E1140 Type 2 diabetes mellitus with diabetic neuropathy, unspecified: Secondary | ICD-10-CM

## 2020-10-25 DIAGNOSIS — E6609 Other obesity due to excess calories: Secondary | ICD-10-CM

## 2020-10-25 DIAGNOSIS — Z978 Presence of other specified devices: Secondary | ICD-10-CM

## 2020-10-25 DIAGNOSIS — F324 Major depressive disorder, single episode, in partial remission: Secondary | ICD-10-CM | POA: Diagnosis not present

## 2020-10-25 DIAGNOSIS — E538 Deficiency of other specified B group vitamins: Secondary | ICD-10-CM | POA: Diagnosis not present

## 2020-10-25 DIAGNOSIS — E1159 Type 2 diabetes mellitus with other circulatory complications: Secondary | ICD-10-CM | POA: Diagnosis not present

## 2020-10-25 DIAGNOSIS — E559 Vitamin D deficiency, unspecified: Secondary | ICD-10-CM | POA: Diagnosis not present

## 2020-10-25 DIAGNOSIS — E1169 Type 2 diabetes mellitus with other specified complication: Secondary | ICD-10-CM

## 2020-10-25 DIAGNOSIS — Z6832 Body mass index (BMI) 32.0-32.9, adult: Secondary | ICD-10-CM

## 2020-10-25 DIAGNOSIS — E66811 Obesity, class 1: Secondary | ICD-10-CM

## 2020-10-25 DIAGNOSIS — I700 Atherosclerosis of aorta: Secondary | ICD-10-CM

## 2020-10-25 DIAGNOSIS — G894 Chronic pain syndrome: Secondary | ICD-10-CM

## 2020-10-25 LAB — MICROALBUMIN, URINE WAIVED
Creatinine, Urine Waived: 50 mg/dL (ref 10–300)
Microalb, Ur Waived: 10 mg/L (ref 0–19)
Microalb/Creat Ratio: <30 mg/g (ref <30)

## 2020-10-25 LAB — BAYER DCA HB A1C WAIVED: HB A1C (BAYER DCA - WAIVED): 8.7 % — ABNORMAL HIGH (ref <7.0)

## 2020-10-25 MED ORDER — TRULICITY 4.5 MG/0.5ML ~~LOC~~ SOAJ: 4.5000 mg | SUBCUTANEOUS | 4 refills | Status: DC

## 2020-10-25 MED ORDER — ATORVASTATIN CALCIUM 20 MG PO TABS: 20.0000 mg | ORAL_TABLET | Freq: Every day | ORAL | 4 refills | Status: DC

## 2020-10-25 MED ORDER — METOPROLOL SUCCINATE ER 25 MG PO TB24: 12.5000 mg | ORAL_TABLET | Freq: Every day | ORAL | 4 refills | Status: DC

## 2020-10-25 MED ORDER — LISINOPRIL 5 MG PO TABS: 5.0000 mg | ORAL_TABLET | Freq: Every day | ORAL | 4 refills | Status: DC

## 2020-10-25 MED ORDER — GABAPENTIN 800 MG PO TABS: 800.0000 mg | ORAL_TABLET | Freq: Three times a day (TID) | ORAL | 4 refills | Status: DC

## 2020-10-25 MED ORDER — METFORMIN HCL 1000 MG PO TABS: 1000.0000 mg | ORAL_TABLET | Freq: Two times a day (BID) | ORAL | 4 refills | Status: DC

## 2020-10-25 NOTE — Assessment & Plan Note (Signed)
Continue current medications and collaboration with cardiology.

## 2020-10-25 NOTE — Assessment & Plan Note (Signed)
Chronic, ongoing.  Continue Trelegy which offers benefit of medication minimization and has improved symptoms overall.  Continue to collaborate with [name] and review notes.  No current daytime O2.

## 2020-10-25 NOTE — Assessment & Plan Note (Signed)
Chronic, stable.  Continue current medication regimen and adjust as needed.  Lipid panel today. 

## 2020-10-25 NOTE — Assessment & Plan Note (Signed)
Continue to collaborate with pain clinic in [place] who monitors this. 

## 2020-10-25 NOTE — Assessment & Plan Note (Signed)
Chronic, stable with BP below goal on visit today and on home readings.  Recommend she continue to monitor BP at home regularly.  Focus on DASH diet.  Continue current medication regimen and adjust as needed, refills sent.  CMP today.  Return in 3 months.

## 2020-10-25 NOTE — Assessment & Plan Note (Signed)
Recheck Vitamin D today and adjust supplement as needed.

## 2020-10-25 NOTE — Assessment & Plan Note (Signed)
Recommended eating smaller high protein, low fat meals more frequently and exercising 30 mins a day 5 times a week with a goal of 10-15lb weight loss in the next 3 months. Patient voiced their understanding and motivation to adhere to these recommendations.  

## 2020-10-25 NOTE — Assessment & Plan Note (Signed)
Chronic, ongoing, insulin dependent. A1C 8.7% today, upward trend due to recent Prednisone.  Reassured patient.  Will increase Trulicity dose, 4.5 MG weekly, and continue Metformin and Tresiba at current doses at this time.  Recommend she continue to monitor BS consistently at home and document + focus heavily on diet changes.  She is aware to notify provider if fasting BS >130 consistently, as may need to adjust insulin further.  Could consider SGLT2 in future.  Return in 3 months.

## 2020-10-25 NOTE — Patient Instructions (Signed)
Diabetes Mellitus and Nutrition, Adult When you have diabetes, or diabetes mellitus, it is very important to have healthy eating habits because your blood sugar (glucose) levels are greatly affected by what you eat and drink. Eating healthy foods in the right amounts, at about the same times every day, can help you:  Control your blood glucose.  Lower your risk of heart disease.  Improve your blood pressure.  Reach or maintain a healthy weight. What can affect my meal plan? Every person with diabetes is different, and each person has different needs for a meal plan. Your health care provider may recommend that you work with a dietitian to make a meal plan that is best for you. Your meal plan may vary depending on factors such as:  The calories you need.  The medicines you take.  Your weight.  Your blood glucose, blood pressure, and cholesterol levels.  Your activity level.  Other health conditions you have, such as heart or kidney disease. How do carbohydrates affect me? Carbohydrates, also called carbs, affect your blood glucose level more than any other type of food. Eating carbs naturally raises the amount of glucose in your blood. Carb counting is a method for keeping track of how many carbs you eat. Counting carbs is important to keep your blood glucose at a healthy level, especially if you use insulin or take certain oral diabetes medicines. It is important to know how many carbs you can safely have in each meal. This is different for every person. Your dietitian can help you calculate how many carbs you should have at each meal and for each snack. How does alcohol affect me? Alcohol can cause a sudden decrease in blood glucose (hypoglycemia), especially if you use insulin or take certain oral diabetes medicines. Hypoglycemia can be a life-threatening condition. Symptoms of hypoglycemia, such as sleepiness, dizziness, and confusion, are similar to symptoms of having too much  alcohol.  Do not drink alcohol if: ? Your health care provider tells you not to drink. ? You are pregnant, may be pregnant, or are planning to become pregnant.  If you drink alcohol: ? Do not drink on an empty stomach. ? Limit how much you use to:  0-1 drink a day for women.  0-2 drinks a day for men. ? Be aware of how much alcohol is in your drink. In the [place] one drink equals one 12 oz bottle of beer (355 mL), one 5 oz glass of wine (148 mL), or one 1 oz glass of hard liquor (44 mL). ? Keep yourself hydrated with water, diet soda, or unsweetened iced tea.  Keep in mind that regular soda, juice, and other mixers may contain a lot of sugar and must be counted as carbs. What are tips for following this plan? Reading food labels  Start by checking the serving size on the "Nutrition Facts" label of packaged foods and drinks. The amount of calories, carbs, fats, and other nutrients listed on the label is based on one serving of the item. Many items contain more than one serving per package.  Check the total grams (g) of carbs in one serving. You can calculate the number of servings of carbs in one serving by dividing the total carbs by 15. For example, if a food has 30 g of total carbs per serving, it would be equal to 2 servings of carbs.  Check the number of grams (g) of saturated fats and trans fats in one serving. Choose foods that have   a low amount or none of these fats.  Check the number of milligrams (mg) of salt (sodium) in one serving. Most people should limit total sodium intake to less than 2,300 mg per day.  Always check the nutrition information of foods labeled as "low-fat" or "nonfat." These foods may be higher in added sugar or refined carbs and should be avoided.  Talk to your dietitian to identify your daily goals for nutrients listed on the label. Shopping  Avoid buying canned, pre-made, or processed foods. These foods tend to be high in fat, sodium, and added  sugar.  Shop around the outside edge of the grocery store. This is where you will most often find fresh fruits and vegetables, bulk grains, fresh meats, and fresh dairy. Cooking  Use low-heat cooking methods, such as baking, instead of high-heat cooking methods like deep frying.  Cook using healthy oils, such as olive, canola, or sunflower oil.  Avoid cooking with butter, cream, or high-fat meats. Meal planning  Eat meals and snacks regularly, preferably at the same times every day. Avoid going long periods of time without eating.  Eat foods that are high in fiber, such as fresh fruits, vegetables, beans, and whole grains. Talk with your dietitian about how many servings of carbs you can eat at each meal.  Eat 4-6 oz (112-168 g) of lean protein each day, such as lean meat, chicken, fish, eggs, or tofu. One ounce (oz) of lean protein is equal to: ? 1 oz (28 g) of meat, chicken, or fish. ? 1 egg. ?  cup (62 g) of tofu.  Eat some foods each day that contain healthy fats, such as avocado, nuts, seeds, and fish.   What foods should I eat? Fruits Berries. Apples. Oranges. Peaches. Apricots. Plums. Grapes. Mango. Papaya. Pomegranate. Kiwi. Cherries. Vegetables Lettuce. Spinach. Leafy greens, including kale, chard, collard greens, and mustard greens. Beets. Cauliflower. Cabbage. Broccoli. Carrots. Green beans. Tomatoes. Peppers. Onions. Cucumbers. Brussels sprouts. Grains Whole grains, such as whole-wheat or whole-grain bread, crackers, tortillas, cereal, and pasta. Unsweetened oatmeal. Quinoa. Brown or wild rice. Meats and other proteins Seafood. Poultry without skin. Lean cuts of poultry and beef. Tofu. Nuts. Seeds. Dairy Low-fat or fat-free dairy products such as milk, yogurt, and cheese. The items listed above may not be a complete list of foods and beverages you can eat. Contact a dietitian for more information. What foods should I avoid? Fruits Fruits canned with  syrup. Vegetables Canned vegetables. Frozen vegetables with butter or cream sauce. Grains Refined white flour and flour products such as bread, pasta, snack foods, and cereals. Avoid all processed foods. Meats and other proteins Fatty cuts of meat. Poultry with skin. Breaded or fried meats. Processed meat. Avoid saturated fats. Dairy Full-fat yogurt, cheese, or milk. Beverages Sweetened drinks, such as soda or iced tea. The items listed above may not be a complete list of foods and beverages you should avoid. Contact a dietitian for more information. Questions to ask a health care provider  Do I need to meet with a diabetes educator?  Do I need to meet with a dietitian?  What number can I call if I have questions?  When are the best times to check my blood glucose? Where to find more information:  American Diabetes Association: diabetes.org  Academy of Nutrition and Dietetics: www.eatright.org  National Institute of Diabetes and Digestive and Kidney Diseases: www.niddk.nih.gov  Association of Diabetes Care and Education Specialists: www.diabeteseducator.org Summary  It is important to have healthy eating   habits because your blood sugar (glucose) levels are greatly affected by what you eat and drink.  A healthy meal plan will help you control your blood glucose and maintain a healthy lifestyle.  Your health care provider may recommend that you work with a dietitian to make a meal plan that is best for you.  Keep in mind that carbohydrates (carbs) and alcohol have immediate effects on your blood glucose levels. It is important to count carbs and to use alcohol carefully. This information is not intended to replace advice given to you by your health care provider. Make sure you discuss any questions you have with your health care provider. Document Revised: 06/28/2019 Document Reviewed: 06/28/2019 Elsevier Patient Education  2021 Elsevier Inc.  

## 2020-10-25 NOTE — Assessment & Plan Note (Signed)
Chronic, ongoing, insulin dependent. A1C 8.7% today, upward trend due to recent Prednisone.  Reassured patient.  Refills on Gabapentin sent.  Urine ALB obtained today.  Will increase Trulicity dose, 4.5 MG weekly, and continue Metformin and Tresiba at current doses at this time.  Recommend she continue to monitor BS consistently at home and document + focus heavily on diet changes.  She is aware to notify provider if fasting BS >130 consistently, as may need to adjust insulin further.  Could consider SGLT2 in future.  Return in 3 months.

## 2020-10-25 NOTE — Assessment & Plan Note (Signed)
Chronic, stable.  Denies SI/HI.  Mood well-controlled.  Continue current medication regimen and adjust as needed.  Refills as needed.  Consider trial of Duloxetine and transition off Effexor in future for dual pain and mood benefit.   

## 2020-10-25 NOTE — Assessment & Plan Note (Signed)
Check B12 level today and continue injections monthly.

## 2020-10-25 NOTE — Assessment & Plan Note (Signed)
Continue to collaborate with chronic pain clinic in [place].  Has morphine pump.   

## 2020-10-25 NOTE — Assessment & Plan Note (Addendum)
Noted on CT 12/11/19.  Continue daily statin.  Monitor closely for symptoms.

## 2020-10-25 NOTE — Progress Notes (Signed)
BP 111/71   Pulse 98   Temp 98.5 F (36.9 C) (Oral)   Wt 156 lb 3.2 oz (70.9 kg)   LMP  (LMP Unknown)   SpO2 97%   BMI 32.09 kg/m    Subjective:    [name], female    DOB: [birth year], 70 y.o.   MRN: [id number]  HPI: Jordan Chan is a 70 y.o. female  Chief Complaint  Patient presents with  . Diabetes  . Depression  . Hyperlipidemia  . Hypertension   DIABETES On Trulicity, taking 3 MG weekly.  Continues on Metformin 1000 MG BID and Tresiba 20 units at HS.  Working with CCM team and provider. Recent A1C in Aug 7.1%.  She also continues on monthly B12 shots for low B12 levels.  Recently was on Prednisone which pushed sugars up -- about 2 weeks ago took this. Hypoglycemic episodes: none Polydipsia/polyuria: no Visual disturbance: no Chest pain: no Paresthesias: no Glucose Monitoring: yes  Accucheck frequency: BID  Fasting glucose: 110 to 130 range -- had some 180 with Prednisone  Post prandial: after lunch 150 to 280 -- had some 380 with Prednisone  Evening:   Before meals: Taking Insulin?: yes  Long acting insulin: Tresiba 20 - 40 (with Prednisone on board took more) units daily  Short acting insulin: Blood Pressure Monitoring: just got cuff yesterday Retinal Examination: Up to Date Foot Exam: Up to Date Pneumovax: Up to Date Influenza: Up to Date Aspirin: yes   HYPERTENSION / HYPERLIPIDEMIA Continues on Atorvastatin 20 MG, Lisinopril 5 MG, Metoprolol 12.5 MG daily + ASA. Recent labs with LDL 63 and TCHOL 154. Satisfied with current treatment? yes Duration of hypertension: chronic BP monitoring frequency: a few times a week BP range:  110-130/60-70 BP medication side effects: no Duration of hyperlipidemia: chronic Cholesterol medication side effects: no Cholesterol supplements: none Medication compliance: good compliance Aspirin: yes Recent stressors: no Recurrent headaches: no Visual changes: no Palpitations: no Dyspnea:  no Chest pain: no Lower extremity edema: no Dizzy/lightheaded: no   COPD Followed by pulmonary for PE and COPD -- last saw 10/08/20.  Currently using Trelegy.  Has a history of smoking, quit 15 years ago. She does not use CPAP, was taken off O2 during daytime at recent pulmonary visit, but continues to use at night.  Is to stay on Apixaban for 3 - 6 months == would place 03/28/20 or 06/28/20 -- pulmonary plans to leave on it until January.  Platelet recently mild elevation 453. COPD status: controlled Satisfied with current treatment?: yes Oxygen use: no Dyspnea frequency: none Cough frequency: none Rescue inhaler frequency:  minimal Limitation of activity: no Productive cough: none Last Spirometry: with pulmonary Pneumovax: Up to Date Influenza: Up to Date  CHRONIC PAIN: Followed by [facility] in [place], last seen 10/18/20 -- continues pump + Gabapentin.  Continues to be followed by clinic nurse for pump check and replacement. She states her pain is well-controlled with current regimen.  Vitamin D on low side (17.5) in June 2020, taking supplement with recent level improved at 31.7.  No recent falls or fractures.  Normal DEXA 2017.  DEPRESSION Continues on Effexor 150 MG daily. Mood status: controlled Satisfied with current treatment?: yes Symptom severity: mild  Duration of current treatment : chronic Side effects: no Medication compliance: good compliance Psychotherapy/counseling: has gone in the past Depressed mood: yes Anxious mood: no Anhedonia: no Significant weight loss or gain: no Insomnia: none Fatigue: no Feelings of  worthlessness or guilt: no Impaired concentration/indecisiveness: no Suicidal ideations: no Hopelessness: no Crying spells: no Depression screen [facility] 2/9 10/25/2020 07/26/2020 03/28/2020 12/23/2019 11/15/2019  Decreased Interest 0 0 0 0 0  Down, Depressed, Hopeless 0 0 0 0 3  PHQ - 2 Score 0 0 0 0 3  Altered sleeping 3 0 3 3 3   Tired, decreased  energy 3 0 3 3 2   Change in appetite 0 0 1 0 0  Feeling bad or failure about yourself  0 0 0 0 0  Trouble concentrating 0 0 1 3 1   Moving slowly or fidgety/restless 0 0 0 0 0  Suicidal thoughts 0 0 0 0 0  PHQ-9 Score 6 0 8 9 9   Difficult doing work/chores Not difficult at all Not difficult at all Not difficult at all Not difficult at all Somewhat difficult  Some recent data might be hidden    Relevant past medical, surgical, family and social history reviewed and updated as indicated. Interim medical history since our last visit reviewed. Allergies and medications reviewed and updated.  Review of Systems  Constitutional: Negative for activity change, appetite change, diaphoresis, fatigue and fever.  Respiratory: Negative for cough, chest tightness and shortness of breath.   Cardiovascular: Negative for chest pain, palpitations and leg swelling.  Gastrointestinal: Negative.   Endocrine: Negative for heat intolerance, polydipsia, polyphagia and polyuria.  Neurological: Negative.   Psychiatric/Behavioral: Negative.     Per HPI unless specifically indicated above     Objective:    BP 111/71   Pulse 98   Temp 98.5 F (36.9 C) (Oral)   Wt 156 lb 3.2 oz (70.9 kg)   LMP  (LMP Unknown)   SpO2 97%   BMI 32.09 kg/m   Wt Readings from Last 3 Encounters:  10/25/20 156 lb 3.2 oz (70.9 kg)  07/04/20 140 lb (63.5 kg)  03/28/20 138 lb 6.4 oz (62.8 kg)    Physical Exam Vitals and nursing note reviewed.  Constitutional:      General: She is awake. She is not in acute distress.    Appearance: She is well-developed and well-groomed. She is not ill-appearing.  HENT:     Head: Normocephalic.     Right Ear: Hearing normal.     Left Ear: Hearing normal.  Eyes:     General: Lids are normal.        Right eye: No discharge.        Left eye: No discharge.     Conjunctiva/sclera: Conjunctivae normal.     Pupils: Pupils are equal, round, and reactive to light.  Neck:     Thyroid: No  thyromegaly.     Vascular: No carotid bruit.  Cardiovascular:     Rate and Rhythm: Normal rate and regular rhythm.     Heart sounds: Normal heart sounds. No murmur heard. No gallop.   Pulmonary:     Effort: Pulmonary effort is normal. No accessory muscle usage or respiratory distress.     Breath sounds: Normal breath sounds.     Comments:   Abdominal:     General: Bowel sounds are normal.     Palpations: Abdomen is soft.  Musculoskeletal:     Cervical back: Normal range of motion and neck supple.     Right lower leg: No edema.     Left lower leg: No edema.  Lymphadenopathy:     Cervical: No cervical adenopathy.  Skin:    General: Skin is warm and dry.  Neurological:  Mental Status: She is alert and oriented to person, place, and time.  Psychiatric:        Attention and Perception: Attention normal.        Mood and Affect: Mood normal.        Speech: Speech normal.        Behavior: Behavior normal. Behavior is cooperative.        Thought Content: Thought content normal.    Diabetic Foot Exam - Simple   Simple Foot Form Visual Inspection No deformities, no ulcerations, no other skin breakdown bilaterally: Yes Sensation Testing Intact to touch and monofilament testing bilaterally: Yes Pulse Check Posterior Tibialis and Dorsalis pulse intact bilaterally: Yes Comments    Results for orders placed or performed in visit on 08/28/20  CBC with Differential/Platelet  Result Value Ref Range   WBC 10.8 3.4 - 10.8 x10E3/uL   RBC 4.03 3.77 - 5.28 x10E6/uL   Hemoglobin 11.3 11.1 - 15.9 g/dL   Hematocrit 35.8 34.0 - 46.6 %   MCV 89 79 - 97 fL   MCH 28.0 26.6 - 33.0 pg   MCHC 31.6 31.5 - 35.7 g/dL   RDW 12.1 11.7 - 15.4 %   Platelets 453 (H) 150 - 450 x10E3/uL   Neutrophils 49 Not Estab. %   Lymphs 33 Not Estab. %   Monocytes 10 Not Estab. %   Eos 7 Not Estab. %   Basos 1 Not Estab. %   Neutrophils Absolute 5.3 1.4 - 7.0 x10E3/uL   Lymphocytes Absolute 3.5 (H) 0.7 - 3.1  x10E3/uL   Monocytes Absolute 1.0 (H) 0.1 - 0.9 x10E3/uL   EOS (ABSOLUTE) 0.7 (H) 0.0 - 0.4 x10E3/uL   Basophils Absolute 0.1 0.0 - 0.2 x10E3/uL   Immature Granulocytes 0 Not Estab. %   Immature Grans (Abs) 0.0 0.0 - 0.1 x10E3/uL      Assessment & Plan:   Problem List Items Addressed This Visit      Cardiovascular and Mediastinum   Hypertension associated with diabetes ([place])    Chronic, stable with BP below goal on visit today and on home readings.  Recommend she continue to monitor BP at home regularly.  Focus on DASH diet.  Continue current medication regimen and adjust as needed, refills sent.  CMP today.  Return in 3 months.       Relevant Medications   metoprolol succinate (TOPROL-XL) 25 MG 24 hr tablet   metFORMIN (GLUCOPHAGE) 1000 MG tablet   lisinopril (ZESTRIL) 5 MG tablet   atorvastatin (LIPITOR) 20 MG tablet   Dulaglutide (TRULICITY) 4.5 BH/4.1PF SOPN   Other Relevant Orders   Bayer DCA Hb A1c Waived   Microalbumin, Urine Waived   Comprehensive metabolic panel   SVT (supraventricular tachycardia) (HCC)    Continue current medications and collaboration with cardiology.      Relevant Medications   metoprolol succinate (TOPROL-XL) 25 MG 24 hr tablet   lisinopril (ZESTRIL) 5 MG tablet   atorvastatin (LIPITOR) 20 MG tablet   Aortic atherosclerosis ([place])    Noted on CT 12/11/19.  Continue daily statin.  Monitor closely for symptoms.      Relevant Medications   metoprolol succinate (TOPROL-XL) 25 MG 24 hr tablet   lisinopril (ZESTRIL) 5 MG tablet   atorvastatin (LIPITOR) 20 MG tablet     Respiratory   COPD, moderate (HCC)    Chronic, ongoing.  Continue Trelegy which offers benefit of medication minimization and has improved symptoms overall.  Continue to collaborate with [name]  and review notes.  No current daytime O2.      Relevant Orders   CBC with Differential/Platelet     Endocrine   Hyperlipidemia associated with type 2 diabetes mellitus (HCC)     Chronic, stable.  Continue current medication regimen and adjust as needed.  Lipid panel today.      Relevant Medications   metFORMIN (GLUCOPHAGE) 1000 MG tablet   lisinopril (ZESTRIL) 5 MG tablet   atorvastatin (LIPITOR) 20 MG tablet   Dulaglutide (TRULICITY) 4.5 FG/1.8EX SOPN   Other Relevant Orders   Bayer DCA Hb A1c Waived   Lipid Panel w/o Chol/HDL Ratio   Type 2 diabetes, controlled, with neuropathy (HCC)    Chronic, ongoing, insulin dependent. A1C 8.7% today, upward trend due to recent Prednisone.  Reassured patient.  Refills on Gabapentin sent.  Urine ALB obtained today.  Will increase Trulicity dose, 4.5 MG weekly, and continue Metformin and Tresiba at current doses at this time.  Recommend she continue to monitor BS consistently at home and document + focus heavily on diet changes.  She is aware to notify provider if fasting BS >130 consistently, as may need to adjust insulin further.  Could consider SGLT2 in future.  Return in 3 months.      Relevant Medications   metFORMIN (GLUCOPHAGE) 1000 MG tablet   lisinopril (ZESTRIL) 5 MG tablet   atorvastatin (LIPITOR) 20 MG tablet   Dulaglutide (TRULICITY) 4.5 HB/7.1IR SOPN   Insulin dependent type 2 diabetes mellitus (HCC) - Primary    Chronic, ongoing, insulin dependent. A1C 8.7% today, upward trend due to recent Prednisone.  Reassured patient.  Will increase Trulicity dose, 4.5 MG weekly, and continue Metformin and Tresiba at current doses at this time.  Recommend she continue to monitor BS consistently at home and document + focus heavily on diet changes.  She is aware to notify provider if fasting BS >130 consistently, as may need to adjust insulin further.  Could consider SGLT2 in future.  Return in 3 months.       Relevant Medications   metFORMIN (GLUCOPHAGE) 1000 MG tablet   lisinopril (ZESTRIL) 5 MG tablet   atorvastatin (LIPITOR) 20 MG tablet   Dulaglutide (TRULICITY) 4.5 CV/8.9FY SOPN   Other Relevant Orders   Bayer DCA  Hb A1c Waived   Comprehensive metabolic panel     Other   Presence of intrathecal pump (Chronic)    Continue to collaborate with pain clinic in [facility] who monitors this.      Obesity    Recommended eating smaller high protein, low fat meals more frequently and exercising 30 mins a day 5 times a week with a goal of 10-15lb weight loss in the next 3 months. Patient voiced their understanding and motivation to adhere to these recommendations.       Relevant Medications   metFORMIN (GLUCOPHAGE) 1000 MG tablet   Dulaglutide (TRULICITY) 4.5 BO/1.7PZ SOPN   Depression, major, single episode, in partial remission (HCC)    Chronic, stable.  Denies SI/HI.  Mood well-controlled.  Continue current medication regimen and adjust as needed.  Refills as needed.  Consider trial of Duloxetine and transition off Effexor in future for dual pain and mood benefit.        B12 deficiency    Check B12 level today and continue injections monthly.      Relevant Orders   Vitamin B12   Chronic pain syndrome    Continue to collaborate with [facility] clinic [facility]  Has morphine pump.        Relevant Medications   gabapentin (NEURONTIN) 800 MG tablet   Vitamin D deficiency    Recheck Vitamin D today and adjust supplement as needed.      Relevant Orders   VITAMIN D 25 Hydroxy (Vit-D Deficiency, Fractures)       Follow up plan: Return in about 3 months (around 01/25/2021) for T2DM, HTN/HLD, MOOD, PAIN, GERD, COPD.

## 2020-10-26 ENCOUNTER — Other Ambulatory Visit: Payer: Self-pay | Admitting: Nurse Practitioner

## 2020-10-26 DIAGNOSIS — D72820 Lymphocytosis (symptomatic): Secondary | ICD-10-CM

## 2020-10-26 LAB — CBC WITH DIFFERENTIAL/PLATELET
Basophils Absolute: 0.1 10*3/uL (ref 0.0–0.2)
Basos: 1 %
EOS (ABSOLUTE): 0.9 10*3/uL — ABNORMAL HIGH (ref 0.0–0.4)
Eos: 7 %
Hematocrit: 36.7 % (ref 34.0–46.6)
Hemoglobin: 11.8 g/dL (ref 11.1–15.9)
Immature Grans (Abs): 0.1 10*3/uL (ref 0.0–0.1)
Immature Granulocytes: 1 %
Lymphocytes Absolute: 3.7 10*3/uL — ABNORMAL HIGH (ref 0.7–3.1)
Lymphs: 28 %
MCH: 29 pg (ref 26.6–33.0)
MCHC: 32.2 g/dL (ref 31.5–35.7)
MCV: 90 fL (ref 79–97)
Monocytes Absolute: 1 10*3/uL — ABNORMAL HIGH (ref 0.1–0.9)
Monocytes: 8 %
Neutrophils Absolute: 7.5 10*3/uL — ABNORMAL HIGH (ref 1.4–7.0)
Neutrophils: 55 %
Platelets: 455 10*3/uL — ABNORMAL HIGH (ref 150–450)
RBC: 4.07 x10E6/uL (ref 3.77–5.28)
RDW: 12.9 % (ref 11.7–15.4)
WBC: 13.2 10*3/uL — ABNORMAL HIGH (ref 3.4–10.8)

## 2020-10-26 LAB — COMPREHENSIVE METABOLIC PANEL
ALT: 16 IU/L (ref 0–32)
AST: 17 IU/L (ref 0–40)
Albumin/Globulin Ratio: 1.3 (ref 1.2–2.2)
Albumin: 4.3 g/dL (ref 3.8–4.8)
Alkaline Phosphatase: 109 IU/L (ref 44–121)
BUN/Creatinine Ratio: 14 (ref 12–28)
BUN: 11 mg/dL (ref 8–27)
Bilirubin Total: 0.2 mg/dL (ref 0.0–1.2)
CO2: 26 mmol/L (ref 20–29)
Calcium: 9.7 mg/dL (ref 8.7–10.3)
Chloride: 96 mmol/L (ref 96–106)
Creatinine, Ser: 0.78 mg/dL (ref 0.57–1.00)
Globulin, Total: 3.2 g/dL (ref 1.5–4.5)
Glucose: 161 mg/dL — ABNORMAL HIGH (ref 65–99)
Potassium: 5.2 mmol/L (ref 3.5–5.2)
Sodium: 137 mmol/L (ref 134–144)
Total Protein: 7.5 g/dL (ref 6.0–8.5)
eGFR: 82 mL/min/{1.73_m2} (ref >59)

## 2020-10-26 LAB — VITAMIN D 25 HYDROXY (VIT D DEFICIENCY, FRACTURES): Vit D, 25-Hydroxy: 33.4 ng/mL (ref 30.0–100.0)

## 2020-10-26 LAB — LIPID PANEL W/O CHOL/HDL RATIO
Cholesterol, Total: 151 mg/dL (ref 100–199)
HDL: 73 mg/dL (ref >39)
LDL Chol Calc (NIH): 59 mg/dL (ref 0–99)
Triglycerides: 104 mg/dL (ref 0–149)
VLDL Cholesterol Cal: 19 mg/dL (ref 5–40)

## 2020-10-26 LAB — VITAMIN B12: Vitamin B-12: 602 pg/mL (ref 232–1245)

## 2020-10-26 NOTE — Progress Notes (Signed)
Please let Kiyonna know her labs have returned.  Overall everything is remaining stable with exception of elevation again being seen in white blood cell count and neutrophils + ongoing mild elevation in platelets.  The elevation in white blood cell count and neutrophils could be related to recent Prednisone use.  I would like to recheck this on outpatient labs in 4 weeks please.  I recommend she schedule lab visit only to recheck.  Thank you.   Keep being amazing!!  Thank you for allowing me to participate in your care. Kindest regards, Kamden Stanislaw

## 2020-10-30 ENCOUNTER — Other Ambulatory Visit: Payer: Self-pay | Admitting: Nurse Practitioner

## 2020-10-30 NOTE — Telephone Encounter (Signed)
Requested medications are due for refill today.  unknown  Requested medications are on the active medications list.  yes  Last refill. 08/22/2020  Future visit scheduled.   Yes  Notes to clinic. Rx to be refilled is for a new kit, not test strips. Unsure if this is correct.  Please advise

## 2020-11-03 DIAGNOSIS — J449 Chronic obstructive pulmonary disease, unspecified: Secondary | ICD-10-CM | POA: Diagnosis not present

## 2020-11-03 DIAGNOSIS — J45909 Unspecified asthma, uncomplicated: Secondary | ICD-10-CM | POA: Diagnosis not present

## 2020-11-06 ENCOUNTER — Other Ambulatory Visit: Payer: Self-pay

## 2020-11-06 ENCOUNTER — Ambulatory Visit (INDEPENDENT_AMBULATORY_CARE_PROVIDER_SITE_OTHER): Payer: Medicare HMO

## 2020-11-06 DIAGNOSIS — E538 Deficiency of other specified B group vitamins: Secondary | ICD-10-CM

## 2020-11-06 MED ORDER — CYANOCOBALAMIN 1000 MCG/ML IJ SOLN
1000.0000 ug | Freq: Once | INTRAMUSCULAR | Status: AC
  Administered 2020-11-06: 1000 ug via INTRAMUSCULAR

## 2020-11-07 DIAGNOSIS — J439 Emphysema, unspecified: Secondary | ICD-10-CM | POA: Diagnosis not present

## 2020-11-07 DIAGNOSIS — R0602 Shortness of breath: Secondary | ICD-10-CM | POA: Diagnosis not present

## 2020-11-07 DIAGNOSIS — R0789 Other chest pain: Secondary | ICD-10-CM | POA: Diagnosis not present

## 2020-11-07 DIAGNOSIS — Z9981 Dependence on supplemental oxygen: Secondary | ICD-10-CM | POA: Diagnosis not present

## 2020-11-08 ENCOUNTER — Telehealth: Payer: Self-pay

## 2020-11-08 NOTE — Progress Notes (Signed)
Chronic Care Management Pharmacy Assistant   Name: Jordan Chan  MRN: [id number] DOB: May 27, 1951  Reason for Encounter: Disease State- Diabetes Mellitus Disease State Call  Recent office visits:  10/25/20- Jordan Guarneri, NP- Insulin Dependent Type 2 DM, f/u 3 months  07/26/20-Jordan Ned Card, NP- General visit/ f/u 3 months   Recent consult visits:  10/18/20- Jordan Chan ( Pain Medicine)- Chronic Pain Syndrome  10/08/20- Jordan Chan (Pulmonology)- COPD, started prednisone 74m x7 days, f/u 1 month  08/29/20- Jordan Chan(Pulmonology)-Follow up visit, stopped Eliquis  08/09/20- Jordan Chan( Pain Medicine)- Chronic Pain Syndrome  07/16/20- Jordan Chan(Pulmonology)- Cough 07/04/20- CNelva Bush MD (Cardio)- Dyspnea on exertion  06/05/20- Jordan Chan(Pain Medicine)-Chronic pain syndrome, Presence of intrathecal pump   Chan visits:  None in previous 6 months  Medications: Outpatient Encounter Medications as of 11/08/2020  Medication Sig  . Blood Glucose Monitoring Suppl (TRUE METRIX METER) w/Device KIT Use to check blood sugar 4 times a day  . Accu-Chek Softclix Lancets lancets TEST BLOOD SUGAR THREE TIMES DAILY  . albuterol (PROVENTIL HFA;VENTOLIN HFA) 108 (90 BASE) MCG/ACT inhaler Inhale 2 puffs into the lungs every 6 (six) hours as needed for wheezing or shortness of breath.   . Alcohol Swabs (B-D SINGLE USE SWABS REGULAR) PADS USE TWICE DAILY  WITH  SUGAR  CHECKS  . atorvastatin (LIPITOR) 20 MG tablet Take 1 tablet (20 mg total) by mouth daily.  . cholecalciferol (VITAMIN D3) 25 MCG (1000 UNIT) tablet Take 1,000 Units by mouth daily.  . Cyanocobalamin 1000 MCG/ML KIT Inject 1,000 mcg as directed every 30 (thirty) days.  . cyclobenzaprine (FLEXERIL) 5 MG tablet Take 5 mg by mouth 2 (two) times daily as needed for muscle spasms. Takes very rarely  . diclofenac (VOLTAREN) 50 MG EC tablet TAKE 1 TABLET THREE TIMES DAILY  . Dulaglutide  (TRULICITY) 4.5 MXB/1.4NWSOPN Inject 4.5 mg as directed once a week.  . gabapentin (NEURONTIN) 800 MG tablet Take 1 tablet (800 mg total) by mouth 3 (three) times daily.  .Marland Kitchenglucose blood (TRUE METRIX BLOOD GLUCOSE TEST) test strip To check blood sugar 3 times a day.  . lactulose (CHRONULAC) 10 GM/15ML solution Take 45 mLs (30 g total) by mouth daily as needed for mild constipation or severe constipation.  .Marland Kitchenlisinopril (ZESTRIL) 5 MG tablet Take 1 tablet (5 mg total) by mouth daily.  . metFORMIN (GLUCOPHAGE) 1000 MG tablet Take 1 tablet (1,000 mg total) by mouth 2 (two) times daily with a meal.  . metoprolol succinate (TOPROL-XL) 25 MG 24 hr tablet Take 0.5 tablets (12.5 mg total) by mouth daily.  .Marland KitchenPAIN MANAGEMENT INTRATHECAL, IT, PUMP 1 each by Intrathecal route. Intrathecal (IT) medication:  Morphine Patient does not remember current. Adjusted every 2.5 months at [facility]  . TRELEGY ELLIPTA 100-62.5-25 MCG/INH AEPB INHALE 1 PUFF INTO THE LUNGS DAILY.  .Marland KitchenTRESIBA FLEXTOUCH 100 UNIT/ML FlexTouch Pen INJECT 30 UNITS INTO THE SKIN AT BEDTIME.  .Marland Kitchenvenlafaxine XR (EFFEXOR-XR) 150 MG 24 hr capsule Take 1 capsule (150 mg total) by mouth daily.   No facility-administered encounter medications on file as of 11/08/2020.   Recent Relevant Labs: Lab Results  Component Value Date/Time   HGBA1C 8.7 (H) 10/25/2020 08:43 AM   HGBA1C 7.1 (H) 07/26/2020 09:00 AM   HGBA1C 7.7% 04/28/2016 12:00 AM   MICROALBUR 10 10/25/2020 08:43 AM   MICROALBUR 10 11/15/2019 09:38 AM    Kidney Function Lab Results  Component Value Date/Time   CREATININE 0.78 10/25/2020 08:45 AM   CREATININE 0.76 07/26/2020 09:02 AM   CREATININE 0.68 09/14/2013 07:42 PM   GFRNONAA 80 07/26/2020 09:02 AM   GFRNONAA >60 09/14/2013 07:42 PM   GFRAA 92 07/26/2020 09:02 AM   GFRAA >60 09/14/2013 07:42 PM    Current antihyperglycemic regimen:  Trulicity 6.2IR/4.8NI- Inject 4.11m into skin weekly  Metformin  1000 mg- one tab twice daily Tresiba 20-25 units daily (adjusts based on BG to avoid hypoglycemia)  What recent interventions/DTPs have been made to improve glycemic control:  None Noted   Have there been any recent hospitalizations or ED visits since last visit with CPP? No   Patient denies hypoglycemic symptoms, including Pale, Sweaty, Shaky, Hungry, Nervous/irritable and Vision changes   Patient reports hyperglycemic symptoms, including fatigue and weakness   How often are you checking your blood sugar? once daily   What are your blood sugars ranging?  o Fasting: 115-140 o Before meals: n/a o After meals: 180-26 o Bedtime:n/a  During the week, how often does your blood glucose drop below 70? Chan  Are you checking your feet daily/regularly?  Patient stated she regularly checks her feet and they look Jordan  Adherence Review: Is the patient currently on a STATIN medication? Yes  Atorvastatin 243m Mail Delivery Is the patient currently on ACE/ARB medication? Yes   Does the patient have >5 day gap between last estimated fill dates? No Metformin 10009m90DS last filled 09/28/20 Dulaglutide 4.5mg98m5ml- 84DS last filled 09/25/20  Patient stated that she had a visit with Pulmonology yesterday 04/06 for difficulty breathing. Patient stated she was prescribed prednisone for seven days and montelukast 10mg31mtab at bedtime. She was instructed to follow up with her Cardiologist if sxs did not improve in 24-48hrs. Patient also noted that Prednisone causes her sugar values to elevate. While talking with me she took her bs and noted it to be 436 after taking prednisone 9:30am.    Star Rating Drugs: Atorvastatin 20mg-20ml Delivery  Dulaglutide 4.5mg/0.6m- 84DS last filled 09/25/20 Lisinopril 5mg- 9075mlast filled 09/08/20 Metformin 1000mg- 9054mast filled 09/28/20  Jordan Chan

## 2020-11-12 ENCOUNTER — Other Ambulatory Visit: Payer: Self-pay | Admitting: Nurse Practitioner

## 2020-11-12 NOTE — Telephone Encounter (Signed)
Requested Prescriptions  Pending Prescriptions Disp Refills  . diclofenac (VOLTAREN) 50 MG EC tablet [Pharmacy Med Name: DICLOFENAC SODIUM DR 50 MG Tablet Delayed Release] 270 tablet 0    Sig: TAKE 1 TABLET THREE TIMES DAILY     Analgesics:  NSAIDS Passed - 11/12/2020  5:17 PM      Passed - Cr in normal range and within 360 days    Creatinine  Date Value Ref Range Status  09/14/2013 0.68 0.60 - 1.30 mg/dL Final   Creatinine, Ser  Date Value Ref Range Status  10/25/2020 0.78 0.57 - 1.00 mg/dL Final         Passed - HGB in normal range and within 360 days    Hemoglobin  Date Value Ref Range Status  10/25/2020 11.8 11.1 - 15.9 g/dL Final         Passed - Patient is not pregnant      Passed - Valid encounter within last 12 months    Recent Outpatient Visits          2 weeks ago Insulin dependent type 2 diabetes mellitus ([place])   [place], Jolene T, NP   3 months ago Type 2 diabetes, controlled, with neuropathy ([place])   [place], Arcadia T, NP   7 months ago Suspected COVID-19 virus infection   [employer], Megan P, DO   7 months ago Type 2 diabetes mellitus with diabetic neuropathy, with long-term current use of insulin ([place])   [place], Jolene T, NP   9 months ago Acute pulmonary embolism without acute cor pulmonale, unspecified pulmonary embolism type ([place])   [place], [name], NP      Future Appointments            In 3 days Walker, Martie Lee, NP [place], [place]   In 1 week  [employer], [place]   In 1 month End, Harrell Gave, MD [employer], [place]   In 2 months [place], [name], NP [employer], [facility]

## 2020-11-13 ENCOUNTER — Telehealth: Payer: Self-pay | Admitting: Pharmacist

## 2020-11-13 NOTE — Telephone Encounter (Signed)
I spoke with [name] regarding her elevated BG with prednisone taper. She is currently on day 7 of 8 and reports FBG 183, post-prandial  420 and 300 last HS. She remains on Tresiba 30 units qpm in addition to metformin 1683 mg bid and Trulicity 4.5 mg qweek. She denies any missed doses. She has follow-up with cardiology on Thursday due to continued SOB. Advised her to continue checking BG QID (before meals and at bedtime) advised her to call PharmD or PCP if FBG remains > 130  consistently 4-5 days after completion of prednisone.

## 2020-11-15 ENCOUNTER — Encounter: Payer: Self-pay | Admitting: Family

## 2020-11-15 ENCOUNTER — Other Ambulatory Visit: Payer: Self-pay

## 2020-11-15 ENCOUNTER — Ambulatory Visit (INDEPENDENT_AMBULATORY_CARE_PROVIDER_SITE_OTHER): Payer: Medicare HMO | Admitting: Family

## 2020-11-15 VITALS — BP 118/72 | HR 105 | Ht 58.5 in | Wt 157.0 lb

## 2020-11-15 DIAGNOSIS — J42 Unspecified chronic bronchitis: Secondary | ICD-10-CM

## 2020-11-15 DIAGNOSIS — E782 Mixed hyperlipidemia: Secondary | ICD-10-CM

## 2020-11-15 DIAGNOSIS — I10 Essential (primary) hypertension: Secondary | ICD-10-CM

## 2020-11-15 DIAGNOSIS — I5042 Chronic combined systolic (congestive) and diastolic (congestive) heart failure: Secondary | ICD-10-CM | POA: Diagnosis not present

## 2020-11-15 DIAGNOSIS — R9431 Abnormal electrocardiogram [ECG] [EKG]: Secondary | ICD-10-CM | POA: Diagnosis not present

## 2020-11-15 DIAGNOSIS — Z86711 Personal history of pulmonary embolism: Secondary | ICD-10-CM

## 2020-11-15 DIAGNOSIS — E119 Type 2 diabetes mellitus without complications: Secondary | ICD-10-CM | POA: Diagnosis not present

## 2020-11-15 DIAGNOSIS — J449 Chronic obstructive pulmonary disease, unspecified: Secondary | ICD-10-CM | POA: Diagnosis not present

## 2020-11-15 MED ORDER — LISINOPRIL 2.5 MG PO TABS: 2.5000 mg | ORAL_TABLET | Freq: Every day | ORAL | 1 refills | Status: DC

## 2020-11-15 MED ORDER — FUROSEMIDE 20 MG PO TABS: 20.0000 mg | ORAL_TABLET | Freq: Every day | ORAL | 5 refills | Status: DC

## 2020-11-15 NOTE — Progress Notes (Signed)
Office Visit    Patient Name: ANALEI Chan Date of Encounter: 11/15/2020  PCP:  [name], NP   [place]  Cardiologist:  [name], MD  Advanced Practice Provider:  No care team member to display Electrophysiologist:  None   Chief Complaint    Jordan Chan is a 70 y.o. female with a hx of stress-induced cardiomyopathy 04/2017 in the setting of pain pump malfunction and opioid withdrawal, PE following hospitalization for pneumonia, heart failure with reduced ejection fraction, chronic low back pain, diabetes mellitus, COPD, GERD, sleep apnea, IBS presents today for shortness of breath  Past Medical History    Past Medical History:  Diagnosis Date  . Arthritis   . Asthma   . Breast cancer (Jordan Chan) 1998   right breast ca with mastectomy and chemotherapy and radiation  . Bronchitis   . CHF (congestive heart failure) (Jordan Chan)    "with Morphine withdrawal"  . COPD (chronic obstructive pulmonary disease) ([place])   . Diabetes mellitus without complication ([place])   . Diverticulitis    diverticulosis also  . Dyspnea   . Endometriosis   . GERD (gastroesophageal reflux disease)   . History of shingles 2000-2005  . Hypercholesteremia   . Hypertension   . IBS (irritable bowel syndrome)   . Low back pain    a. Implanted morphine/bupivicaine/clonidine pump.  [name] Neuropathy   . Orthopnea   . Oxygen dependent    uses at night  . Personal history of chemotherapy   . Personal history of radiation therapy   . Pneumonia    pneumonia 5-6 times, history of bronchitis also  . Scoliosis   . Sleep apnea    does not use cpap  . Stroke (Jordan Chan) 2010   TIA, 10 years ago  . Withdrawal from sedative drug ([place])    withdrawal from morphine when pump batteries died   Past Surgical History:  Procedure Laterality Date  . ABDOMINAL HYSTERECTOMY  1987  . BACK SURGERY     Tailbone removed following fracture  . BREAST SURGERY Right    mastectomy  .  CARDIAC CATHETERIZATION    . CATARACT EXTRACTION W/PHACO Right 05/19/2019   Procedure: CATARACT EXTRACTION PHACO AND INTRAOCULAR LENS PLACEMENT ([place]), RIGHT, DIABETIC;  Surgeon: Marchia Meiers, MD;  Location: [facility] ORS;  Service: Ophthalmology;  Laterality: Right;  Lot # [id number] H [place]: 00:43.8 CDE: 4.59  . CATARACT EXTRACTION W/PHACO Left 06/16/2019   Procedure: CATARACT EXTRACTION PHACO AND INTRAOCULAR LENS PLACEMENT ([place]) LEFT VISION BLUE DIABETIC;  Surgeon: Marchia Meiers, MD;  Location: [facility] ORS;  Service: Ophthalmology;  Laterality: Left;  Lot #[id number] H [place]: 00:46.9 CDE: 6.53  . COCCYX REMOVAL    . COLONOSCOPY WITH PROPOFOL N/A 01/01/2017   Procedure: COLONOSCOPY WITH PROPOFOL;  Surgeon: Jonathon Bellows, MD;  Location: [facility] END[facility]COPY;  Service: Endoscopy;  Laterality: N/A;  . ELBOW ARTHROSCOPY WITH TENDON RECONSTRUCTION    . ESOPHAGOGASTRODUODENOSCOPY (EGD) WITH PROPOFOL N/A 01/01/2017   Procedure: ESOPHAGOGASTRODUODENOSCOPY (EGD) WITH PROPOFOL;  Surgeon: Jonathon Bellows, MD;  Location: [facility] ENDOSCOPY;  Service: Endoscopy;  Laterality: N/A;  . INTRATHECAL PUMP IMPLANT    . LEFT HEART CATH AND CORONARY ANGIOGRAPHY N/A 04/28/2017   Procedure: LEFT HEART CATH AND CORONARY ANGIOGRAPHY;  Surgeon: [name], MD;  Location: [facility] CV LAB;  Service: Cardiovascular;  Laterality: N/A;  . MASTECTOMY Right 06/1997  . morphine pump  2011  . PLANTAR FASCIA RELEASE    . TRIGGER FINGER RELEASE  Allergies  Allergies  Allergen Reactions  . Other Palpitations    IV steroids  . Pain Patch [Menthol] Anaphylaxis  . Fentanyl   . Avelox [Moxifloxacin Hcl In Nacl] Other (See Comments)    Upset stomach  . Doxycycline Diarrhea  . Erythromycin Nausea Only and Other (See Comments)    Can take a Z-Pak just fine Other reaction(s): Other (See Comments) Can take Z-Pak  Can take a Z-Pak just fine  . Moxifloxacin Hcl Other (See Comments)    Upset stomach Upset stomach  . Oxycontin [Oxycodone] Hives   . Ozempic [Semaglutide] Nausea Only    History of Present Illness    Jordan Chan is a 70 y.o. female with a hx of  stress-induced cardiomyopathy 04/2017 in the setting of pain pump malfunction and opioid withdrawal, PE following hospitalization for pneumonia, heart failure with reduced ejection fraction, chronic low back pain, diabetes mellitus, COPD, GERD, sleep apnea, IBS  last seen 07/04/2020 by Dr. Saunders Revel.  She was admitted May 2021 with pneumonia and subsequent pulmonary embolism treated with Eliquis.  At last clinic visit 07/04/2020 she noted persistent exertional dyspnea chronic respiratory failure with hypoxia since her hospitalization in May for PE and pneumonia.  She was recommended for echocardiogram.  Echo 07/25/2020 LVEF 45-60%, LV global hypokinesis, grade 2 diastolic dysfunction, RV normal size and function, tricuspid aortic valve with no stenosis, right atrial pressure 3 mmHg.  She was seen by [name] of pulmonology on 11/07/2020 and started on prednisone taper as well as Singulair.  She presents today for follow up. Notes no change since Prednisone taper. Notes shortness of breath at rest and with exertion over the last 2 month. She has a hospital bed and has sat up to sleep for many years so longstanding orthopnea. Denies PND. She additionally endorses fatigue. Reports no chest pain but notes it feels as if someone has "tightened by bra up 2 or 3 notches" as she feels tight when she gets short of breath. Endorses palpitations and sensation of heart beating forcefully. Endorses some lightheadedness associated with balance issues and some near syncope but no syncope. Her primary care has asked her to check her BP at home and she has a cuff to do so. Notes pedal edema and sensation of abdominal bloating and swelling.   Tells me she mostly eats at [employer]. Does not add table salt to her food. She does not drink much, she drinks sweet tea with meals but she does not drink  water as she does not like it. Exercise is limited by dyspnea.    EKGs/Labs/Other Studies Reviewed:   The following studies were reviewed today:  Echo 07/25/20 1. Left ventricular ejection fraction, by estimation, is 45 to 50%. The  left ventricle has mildly decreased function. The left ventricle  demonstrates global hypokinesis. Left ventricular diastolic parameters are  consistent with Grade II diastolic  dysfunction (pseudonormalization).   2. Right ventricular systolic function is normal. The right ventricular  size is normal.   3. The mitral valve is normal in structure. No evidence of mitral valve  regurgitation.   4. The aortic valve is tricuspid. Aortic valve regurgitation is not  visualized.   5. The inferior vena cava is normal in size with greater than 50%  respiratory variability, suggesting right atrial pressure of 3 mmHg.    EKG:  EKG is  ordered today.  The ekg ordered today demonstrates ST 105 bpm with minimal voltager criteria for LVH.  Also noted T  wave abnormalities in anterolateral leads with reviewed by Dr. Saunders Revel stating likely due to volume overload and approaching LBBB criteria.  Recent Labs: 12/12/2019: B Natriuretic Peptide 546.0 12/19/2019: Magnesium 2.0 07/26/2020: TSH 3.100 10/25/2020: ALT 16; BUN 11; Creatinine, Ser 0.78; Hemoglobin 11.8; Platelets 455; Potassium 5.2; Sodium 137  Recent Lipid Panel    Component Value Date/Time   CHOL 151 10/25/2020 0845   CHOL 127 07/26/2019 0817   TRIG 104 10/25/2020 0845   TRIG 104 07/26/2019 0817   HDL 73 10/25/2020 0845   VLDL 21 07/26/2019 0817   LDLCALC 59 10/25/2020 0845     Home Medications   Current Meds  Medication Sig  . Accu-Chek Softclix Lancets lancets TEST BLOOD SUGAR THREE TIMES DAILY  . albuterol (PROVENTIL HFA;VENTOLIN HFA) 108 (90 BASE) MCG/ACT inhaler Inhale 2 puffs into the lungs every 6 (six) hours as needed for wheezing or shortness of breath.   . Alcohol Swabs (B-D SINGLE USE SWABS  REGULAR) PADS USE TWICE DAILY  WITH  SUGAR  CHECKS  . atorvastatin (LIPITOR) 20 MG tablet Take 1 tablet (20 mg total) by mouth daily.  . Blood Glucose Monitoring Suppl (TRUE METRIX METER) w/Device KIT Use to check blood sugar 4 times a day  . cholecalciferol (VITAMIN D3) 25 MCG (1000 UNIT) tablet Take 1,000 Units by mouth daily.  . Cyanocobalamin 1000 MCG/ML KIT Inject 1,000 mcg as directed every 30 (thirty) days.  . cyclobenzaprine (FLEXERIL) 5 MG tablet Take 5 mg by mouth 2 (two) times daily as needed for muscle spasms. Takes very rarely  . diclofenac (VOLTAREN) 50 MG EC tablet TAKE 1 TABLET THREE TIMES DAILY  . Dulaglutide (TRULICITY) 4.5 RC/1.6LA SOPN Inject 4.5 mg as directed once a week.  . gabapentin (NEURONTIN) 800 MG tablet Take 1 tablet (800 mg total) by mouth 3 (three) times daily.  [name] glucose blood (TRUE METRIX BLOOD GLUCOSE TEST) test strip To check blood sugar 3 times a day.  . lactulose (CHRONULAC) 10 GM/15ML solution Take 45 mLs (30 g total) by mouth daily as needed for mild constipation or severe constipation.  [name] lisinopril (ZESTRIL) 5 MG tablet Take 1 tablet (5 mg total) by mouth daily.  . metFORMIN (GLUCOPHAGE) 1000 MG tablet Take 1 tablet (1,000 mg total) by mouth 2 (two) times daily with a meal.  . metoprolol succinate (TOPROL-XL) 25 MG 24 hr tablet Take 0.5 tablets (12.5 mg total) by mouth daily.  . montelukast (SINGULAIR) 10 MG tablet Take 10 mg by mouth at bedtime.  [name] PAIN MANAGEMENT INTRATHECAL, IT, PUMP 1 each by Intrathecal route. Intrathecal (IT) medication:  Morphine Patient does not remember current. Adjusted every 2.5 months at [facility] in [place].  . TRELEGY ELLIPTA 100-62.5-25 MCG/INH AEPB INHALE 1 PUFF INTO THE LUNGS DAILY.  [name] TRESIBA FLEXTOUCH 100 UNIT/ML FlexTouch Pen INJECT 30 UNITS INTO THE SKIN AT BEDTIME.  [name] venlafaxine XR (EFFEXOR-XR) 150 MG 24 hr capsule Take 1 capsule (150 mg total) by mouth daily.     Review of Systems  \All  other systems reviewed and are otherwise negative except as noted above.  Physical Exam    VS:  BP 118/72 (BP Location: Left Arm, Patient Position: Sitting, Cuff Size: Normal)   Pulse (!) 105   Ht 4' 10.5" (1.486 m)   Wt 157 lb (71.2 kg)   LMP  (LMP Unknown)   SpO2 94%   BMI 32.25 kg/m  , BMI Body mass index is 32.25 kg/m.  Wt Readings from  Last 3 Encounters:  11/15/20 157 lb (71.2 kg)  10/25/20 156 lb 3.2 oz (70.9 kg)  07/04/20 140 lb (63.5 kg)    GEN: Well nourished, overweigh,  well developed, in no acute distress. HEENT: normal. Neck: Supple, no JVD, carotid bruits, or masses. Cardiac: RRR, no murmurs, rubs, or gallops. No clubbing, cyanosis, edema.  Radials/PT 2+ and equal bilaterally.  Respiratory:  Respirations regular and unlabored, clear to auscultation bilaterally. GI: Soft, nontender, nondistended. MS: No deformity or atrophy. Skin: Warm and dry, no rash. Neuro:  Strength and sensation are intact. Psych: Normal affect.  Assessment & Plan    1. HFrEF /stress-induced cardiomyopathy /dyspnea on exertion - Echo 07/25/20 LVEF 45-50%, gr2DD, RV normal size and function, RA pressure 3 mmHg. Previous stress induced cardiomyopathy 2018 with normalization of LVEF and cath at that time with no CAD. 2 month history of worsening dyspnea. Weight up 17 pounds 4.5 months. Start Lasix 69m daily. No potassium supplement as most recent K 10/25/20 of 5.2. Reduce Lisinopril to 2.5569mdaily to prevent hypotension. Continue Toprol 12.69m22maily. Future considerations include MRA/SGLT2i. BMP in 2 weeks. Heart failure education provided.  Pending response to diuretic therapy may need to consider R/LHC.  2. History of PE - In setting of pneumonia. Has completed 6 month course of Eliquis.   3. Abnormal EKG - EKG today ST 105 bpm with minimal voltage criteria for LVH and T wave abnormalities in the anterolateral leads.  Reviewed with Dr. EndSaunders Revelo clarified suggestive of volume overload and  approaching left bundle branch block.  Will need repeat EKG at follow-up and consideration of right/left heart cath if clinically appropriate.  She reports no chest pain, pressure, tightness in the majority of her symptoms are very consistent with heart failure.  4. COPD - Likely contributory to dyspnea. Continue to follow with pulmonology.  5. HTN - BP low normal today and reports some episodes of dizziness concerning for orthostasis. As we are adding Lasix, reduce Lisinopril to 2.69mg58mily.   6. HLD - Continue Atorvastatin 20mg22mly.  7. DM2 - Continue to follow with PCP.  Disposition: Follow up in 3 week(s) with Dr. End oSaunders RevelPP with EKG at that time.  Signed, CaitlLoel Dubonnet4/14/2022, 10:15 AM [facility]

## 2020-11-15 NOTE — Patient Instructions (Signed)
Medication Instructions:  Your physician has recommended you make the following change in your medication:  START Furosemide (Lasix) 20mg  daily  CHANGE Lisinopril to 2.5mg  (half tablet) daily  *If you need a refill on your cardiac medications before your next appointment, please call your pharmacy*   Lab Work: Your physician recommends that you return for lab work in: 2 weeks for BMP. We will have this collected with your lab work with primary care.   If you have labs (blood work) drawn today and your tests are completely normal, you will receive your results only by: [name] MyChart Message (if you have MyChart) OR . A paper copy in the mail If you have any lab test that is abnormal or we need to change your treatment, we will call you to review the results.  Testing/Procedures: Your EKG today showed sinus tachycardia which is a mildly elevated but regular heart rate.   Your echocardiogram in December showed your heart pumping function was mildly reduced and your heart was moderately stiff. This can lead to extra fluid which the new medication will help with.    Follow-Up: At [facility], you and your health needs are our priority.  As part of our continuing mission to provide you with exceptional heart care, we have created designated Provider Care Teams.  These Care Teams include your primary Cardiologist (physician) and Advanced Practice Providers (APPs -  Physician Assistants and Nurse Practitioners) who all work together to provide you with the care you need, when you need it.  We recommend signing up for the patient portal called "MyChart".  Sign up information is provided on this After Visit Summary.  MyChart is used to connect with patients for Virtual Visits (Telemedicine).  Patients are able to view lab/test results, encounter notes, upcoming appointments, etc.  Non-urgent messages can be sent to your provider as well.   To learn more about what you can do with MyChart, go to  [email or web address].    Your next appointment:   3 week(s)  The format for your next appointment:   In Person  Provider:   You may see [name], MD or one of the following Advanced Practice Providers on your designated Care Team:    Murray Hodgkins, NP  [name], PA-C  Marrianne Mood, PA-C  Cadence Kathlen Mody, [place]  Laurann Montana, NP  Other Instructions  Heart Healthy Diet Recommendations: A low-salt diet is recommended. Meats should be grilled, baked, or boiled. Avoid fried foods. Focus on lean protein sources like fish or chicken with vegetables and fruits. The American Heart Association is a Microbiologist!  American Heart Association Diet and Lifeystyle Recommendations   Exercise recommendations: The American Heart Association recommends 150 minutes of moderate intensity exercise weekly. Try 30 minutes of moderate intensity exercise 4-5 times per week. This could include walking, jogging, or swimming.   Heart Failure, Diagnosis  Heart failure means that your heart is not able to pump blood in the right way. This makes it hard for your body to work well. Heart failure is usually a long-term (chronic) condition. You must take good care of yourself and follow your treatment plan from your doctor. What are the causes?  High blood pressure.  Buildup of cholesterol and fat in the arteries.  Heart attack. This injures the heart muscle.  Heart valves that do not open and close properly.  Damage of the heart muscle. This is also called cardiomyopathy.  Infection of the heart muscle. This is also called myocarditis.  Lung disease. What increases the risk?  Getting older. The risk of heart failure goes up as a person ages.  Being overweight.  Being female.  Use tobacco or nicotine products.  Abusing alcohol or drugs.  Having taken medicines that can damage the heart.  Having any of these conditions: ? Diabetes. ? Abnormal heart rhythms. ? Thyroid  problems. ? Low blood counts (anemia).  Having a family history of heart failure. What are the signs or symptoms?  Shortness of breath.  Coughing.  Swelling of the feet, ankles, legs, or belly.  Losing or gaining weight for no reason.  Trouble breathing.  Waking from sleep because of the need to sit up and get more air.  Fast heartbeat.  Being very tired.  Feeling dizzy, or feeling like you may pass out (faint).  Having no desire to eat.  Feeling like you may vomit (nauseous).  Peeing (urinating) more at night.  Feeling confused. How is this treated? This condition may be treated with:  Medicines. These can be given to treat blood pressure and to make the heart muscles stronger.  Changes in your daily life. These may include: ? Eating a healthy diet. ? Staying at a healthy body weight. ? Quitting tobacco, alcohol, and drug use. ? Doing exercises. ? Participating in a cardiac rehabilitation program. This program helps you improve your health through exercise, education, and counseling.  Surgery. Surgery can be done to open blocked valves, or to put devices in the heart, such as pacemakers.  A donor heart (heart transplant). You will receive a healthy heart from a donor. Follow these instructions at home:  Treat other conditions as told by your doctor. These may include high blood pressure, diabetes, thyroid disease, or abnormal heart rhythms.  Learn as much as you can about heart failure.  Get support as you need it.  Keep all follow-up visits. Summary  Heart failure means that your heart is not able to pump blood in the right way.  This condition is often caused by high blood pressure, heart attack, or damage of the heart muscle.  Symptoms of this condition include shortness of breath and swelling of the feet, ankles, legs, or belly. You may also feel very tired or feel like you may vomit.  You may be treated with medicines, surgery, or changes in your  daily life.  Treat other health conditions as told by your doctor. This information is not intended to replace advice given to you by your health care provider. Make sure you discuss any questions you have with your health care provider. Document Revised: 02/11/2020 Document Reviewed: 02/11/2020 Elsevier Patient Education  [place].

## 2020-11-20 ENCOUNTER — Telehealth: Payer: Self-pay

## 2020-11-20 NOTE — Telephone Encounter (Signed)
Patient was notified of Dr.Jolene's recommendations and states she would give our office a call back if she noticed any concerning reading.

## 2020-11-20 NOTE — Telephone Encounter (Signed)
Thank you, alert her I do not think Lasix would do this.  Has she had any recent steroid treatment, this would cause elevations, including any steroid injections.  Any increased stressors?  I would recommend increasing her Tyler Aas to 40 units and monitor, if they creep up to 300 or 400 range let me know right away.  Would like to see in office next week to assess if they remain in 200 range consistently.  Thanks.

## 2020-11-20 NOTE — Telephone Encounter (Signed)
Copied from [place] [id number]. Topic: General - Inquiry >> Nov 20, 2020  2:45 PM Greggory Keen D wrote: Reason for CRM: Pt called saying her blood sugar has been being around 200.  She thinks it is because her cardiologist put her on [place]

## 2020-11-21 ENCOUNTER — Ambulatory Visit: Payer: Medicare HMO

## 2020-11-21 DIAGNOSIS — J9621 Acute and chronic respiratory failure with hypoxia: Secondary | ICD-10-CM | POA: Diagnosis not present

## 2020-11-21 DIAGNOSIS — J449 Chronic obstructive pulmonary disease, unspecified: Secondary | ICD-10-CM | POA: Diagnosis not present

## 2020-11-21 DIAGNOSIS — R0600 Dyspnea, unspecified: Secondary | ICD-10-CM | POA: Diagnosis not present

## 2020-11-22 ENCOUNTER — Telehealth: Payer: Self-pay

## 2020-11-23 ENCOUNTER — Ambulatory Visit: Payer: Medicare HMO

## 2020-11-29 ENCOUNTER — Other Ambulatory Visit: Payer: Medicare HMO

## 2020-11-29 ENCOUNTER — Other Ambulatory Visit: Payer: Self-pay

## 2020-11-29 DIAGNOSIS — I152 Hypertension secondary to endocrine disorders: Secondary | ICD-10-CM

## 2020-11-29 DIAGNOSIS — E1159 Type 2 diabetes mellitus with other circulatory complications: Secondary | ICD-10-CM | POA: Diagnosis not present

## 2020-11-29 DIAGNOSIS — D72820 Lymphocytosis (symptomatic): Secondary | ICD-10-CM | POA: Diagnosis not present

## 2020-11-30 ENCOUNTER — Other Ambulatory Visit: Payer: Self-pay | Admitting: Nurse Practitioner

## 2020-11-30 DIAGNOSIS — R7989 Other specified abnormal findings of blood chemistry: Secondary | ICD-10-CM

## 2020-11-30 DIAGNOSIS — D72820 Lymphocytosis (symptomatic): Secondary | ICD-10-CM

## 2020-11-30 LAB — COMPREHENSIVE METABOLIC PANEL
ALT: 10 IU/L (ref 0–32)
AST: 16 IU/L (ref 0–40)
Albumin/Globulin Ratio: 1.2 (ref 1.2–2.2)
Albumin: 4.4 g/dL (ref 3.8–4.8)
Alkaline Phosphatase: 89 IU/L (ref 44–121)
BUN/Creatinine Ratio: 13 (ref 12–28)
BUN: 11 mg/dL (ref 8–27)
Bilirubin Total: <0.2 mg/dL (ref 0.0–1.2)
CO2: 25 mmol/L (ref 20–29)
Calcium: 9.6 mg/dL (ref 8.7–10.3)
Chloride: 92 mmol/L — ABNORMAL LOW (ref 96–106)
Creatinine, Ser: 0.82 mg/dL (ref 0.57–1.00)
Globulin, Total: 3.7 g/dL (ref 1.5–4.5)
Glucose: 149 mg/dL — ABNORMAL HIGH (ref 65–99)
Potassium: 5.2 mmol/L (ref 3.5–5.2)
Sodium: 138 mmol/L (ref 134–144)
Total Protein: 8.1 g/dL (ref 6.0–8.5)
eGFR: 77 mL/min/{1.73_m2} (ref >59)

## 2020-11-30 LAB — CBC WITH DIFFERENTIAL/PLATELET
Basophils Absolute: 0.1 10*3/uL (ref 0.0–0.2)
Basos: 1 %
EOS (ABSOLUTE): 0.6 10*3/uL — ABNORMAL HIGH (ref 0.0–0.4)
Eos: 6 %
Hematocrit: 37.2 % (ref 34.0–46.6)
Hemoglobin: 11.9 g/dL (ref 11.1–15.9)
Immature Grans (Abs): 0.1 10*3/uL (ref 0.0–0.1)
Immature Granulocytes: 1 %
Lymphocytes Absolute: 3.2 10*3/uL — ABNORMAL HIGH (ref 0.7–3.1)
Lymphs: 29 %
MCH: 28.1 pg (ref 26.6–33.0)
MCHC: 32 g/dL (ref 31.5–35.7)
MCV: 88 fL (ref 79–97)
Monocytes Absolute: 1 10*3/uL — ABNORMAL HIGH (ref 0.1–0.9)
Monocytes: 9 %
Neutrophils Absolute: 6.3 10*3/uL (ref 1.4–7.0)
Neutrophils: 54 %
Platelets: 497 10*3/uL — ABNORMAL HIGH (ref 150–450)
RBC: 4.24 x10E6/uL (ref 3.77–5.28)
RDW: 13.2 % (ref 11.7–15.4)
WBC: 11.3 10*3/uL — ABNORMAL HIGH (ref 3.4–10.8)

## 2020-11-30 NOTE — Progress Notes (Signed)
Good morning, please let Jerrie know her labs have returned.  Kidney, liver, and electrolytes remain stable.  Your white blood cell count remains slightly elevated, as are some of the white cell types.  Your platelets are also slightly elevated, trending up from previous.  Have you taken any Prednisone recently?  Any aspirin at home?  I would like to recheck these again in 4 weeks to see if platelets continue to be elevated or trend up + check on white blood cell count.  If these remain elevated I will want to get you in with hematology for further assessment.  Any questions?  Please schedule 4 weeks lab visit.  Thank you. Keep being awesome!!  Thank you for allowing me to participate in your care. Kindest regards, Ryot Burrous

## 2020-12-02 ENCOUNTER — Other Ambulatory Visit: Payer: Self-pay | Admitting: Nurse Practitioner

## 2020-12-02 NOTE — Telephone Encounter (Signed)
Requested medication (s) are due for refill today: yes  Requested medication (s) are on the active medication list: yes  Last refill:  11/24/19  Future visit scheduled: yes  Notes to clinic:  med not assigned to a protocol    Requested Prescriptions  Pending Prescriptions Disp [place] 100-62.5-25 MCG/INH AEPB [Pharmacy Med Name: TRELEGY ELLIPTA 100-62.5-25 MCG/INH Aerosol Powder Breath Activated] 180 each 4    Sig: INHALE 1 PUFF INTO THE LUNGS DAILY.      Off-Protocol Failed - 12/02/2020  5:37 AM      Failed - Medication not assigned to a protocol, review manually.      Passed - Valid encounter within last 12 months    Recent Outpatient Visits           1 month ago Insulin dependent type 2 diabetes mellitus ([place])   [place], Jolene T, NP   4 months ago Type 2 diabetes, controlled, with neuropathy ([place])   [place], [place] T, NP   8 months ago Suspected COVID-19 virus infection   [employer], Megan P, DO   8 months ago Type 2 diabetes mellitus with diabetic neuropathy, with long-term current use of insulin ([place])   [place], Jolene T, NP   10 months ago Acute pulmonary embolism without acute cor pulmonale, unspecified pulmonary embolism type ([place])   [place], [name], NP       Future Appointments             In 5 days End, Harrell Gave, MD [place], [facility]   In 1 week  [employer], [place]   In 1 month End, Harrell Gave, MD [place], [place]   In 1 month [place], [name], NP [employer], [facility]

## 2020-12-03 DIAGNOSIS — J449 Chronic obstructive pulmonary disease, unspecified: Secondary | ICD-10-CM | POA: Diagnosis not present

## 2020-12-03 DIAGNOSIS — J45909 Unspecified asthma, uncomplicated: Secondary | ICD-10-CM | POA: Diagnosis not present

## 2020-12-03 NOTE — Telephone Encounter (Signed)
Scheduled 6/24

## 2020-12-04 ENCOUNTER — Other Ambulatory Visit: Payer: Self-pay

## 2020-12-04 ENCOUNTER — Ambulatory Visit (INDEPENDENT_AMBULATORY_CARE_PROVIDER_SITE_OTHER): Payer: Medicare HMO

## 2020-12-04 DIAGNOSIS — E538 Deficiency of other specified B group vitamins: Secondary | ICD-10-CM | POA: Diagnosis not present

## 2020-12-04 MED ORDER — CYANOCOBALAMIN 1000 MCG/ML IJ SOLN
1000.0000 ug | Freq: Once | INTRAMUSCULAR | Status: AC
  Administered 2020-12-04: 1000 ug via INTRAMUSCULAR

## 2020-12-07 ENCOUNTER — Telehealth: Payer: Self-pay | Admitting: Internal Medicine

## 2020-12-07 ENCOUNTER — Encounter: Payer: Self-pay | Admitting: Internal Medicine

## 2020-12-07 ENCOUNTER — Other Ambulatory Visit: Payer: Self-pay

## 2020-12-07 ENCOUNTER — Emergency Department: Payer: Medicare HMO

## 2020-12-07 ENCOUNTER — Other Ambulatory Visit
Admission: RE | Admit: 2020-12-07 | Discharge: 2020-12-07 | Disposition: A | Discharge status: Home / Self Care | Payer: Medicare HMO | Source: Home / Self Care | Attending: Internal Medicine | Admitting: Internal Medicine

## 2020-12-07 ENCOUNTER — Telehealth: Payer: Self-pay | Admitting: Pharmacist

## 2020-12-07 ENCOUNTER — Ambulatory Visit (INDEPENDENT_AMBULATORY_CARE_PROVIDER_SITE_OTHER): Payer: Medicare HMO | Admitting: Internal Medicine

## 2020-12-07 ENCOUNTER — Telehealth: Payer: Self-pay

## 2020-12-07 ENCOUNTER — Emergency Department
Admission: EM | Admit: 2020-12-07 | Discharge: 2020-12-07 | Disposition: A | Discharge status: Home / Self Care | Payer: Medicare HMO | Attending: Emergency Medicine | Admitting: Emergency Medicine

## 2020-12-07 VITALS — BP 120/70 | HR 102 | Ht 58.5 in | Wt 158.0 lb

## 2020-12-07 DIAGNOSIS — Z87891 Personal history of nicotine dependence: Secondary | ICD-10-CM | POA: Insufficient documentation

## 2020-12-07 DIAGNOSIS — R5383 Other fatigue: Secondary | ICD-10-CM

## 2020-12-07 DIAGNOSIS — E1159 Type 2 diabetes mellitus with other circulatory complications: Secondary | ICD-10-CM | POA: Insufficient documentation

## 2020-12-07 DIAGNOSIS — Z7984 Long term (current) use of oral hypoglycemic drugs: Secondary | ICD-10-CM | POA: Diagnosis not present

## 2020-12-07 DIAGNOSIS — Z794 Long term (current) use of insulin: Secondary | ICD-10-CM | POA: Diagnosis not present

## 2020-12-07 DIAGNOSIS — I152 Hypertension secondary to endocrine disorders: Secondary | ICD-10-CM | POA: Insufficient documentation

## 2020-12-07 DIAGNOSIS — I110 Hypertensive heart disease with heart failure: Secondary | ICD-10-CM | POA: Diagnosis not present

## 2020-12-07 DIAGNOSIS — R000 Tachycardia, unspecified: Secondary | ICD-10-CM | POA: Insufficient documentation

## 2020-12-07 DIAGNOSIS — I509 Heart failure, unspecified: Secondary | ICD-10-CM | POA: Diagnosis not present

## 2020-12-07 DIAGNOSIS — R0602 Shortness of breath: Secondary | ICD-10-CM | POA: Diagnosis present

## 2020-12-07 DIAGNOSIS — Z79899 Other long term (current) drug therapy: Secondary | ICD-10-CM | POA: Diagnosis not present

## 2020-12-07 DIAGNOSIS — J449 Chronic obstructive pulmonary disease, unspecified: Secondary | ICD-10-CM | POA: Insufficient documentation

## 2020-12-07 DIAGNOSIS — J45909 Unspecified asthma, uncomplicated: Secondary | ICD-10-CM | POA: Diagnosis not present

## 2020-12-07 DIAGNOSIS — Z853 Personal history of malignant neoplasm of breast: Secondary | ICD-10-CM | POA: Insufficient documentation

## 2020-12-07 DIAGNOSIS — I5023 Acute on chronic systolic (congestive) heart failure: Secondary | ICD-10-CM

## 2020-12-07 DIAGNOSIS — E1140 Type 2 diabetes mellitus with diabetic neuropathy, unspecified: Secondary | ICD-10-CM | POA: Diagnosis not present

## 2020-12-07 LAB — BASIC METABOLIC PANEL
Anion gap: 10 (ref 5–15)
BUN: 12 mg/dL (ref 8–23)
CO2: 30 mmol/L (ref 22–32)
Calcium: 9.2 mg/dL (ref 8.9–10.3)
Chloride: 94 mmol/L — ABNORMAL LOW (ref 98–111)
Creatinine, Ser: 0.89 mg/dL (ref 0.44–1.00)
GFR, Estimated: >60 mL/min (ref >60)
Glucose, Bld: 155 mg/dL — ABNORMAL HIGH (ref 70–99)
Potassium: 4.6 mmol/L (ref 3.5–5.1)
Sodium: 134 mmol/L — ABNORMAL LOW (ref 135–145)

## 2020-12-07 LAB — TROPONIN I (HIGH SENSITIVITY): Troponin I (High Sensitivity): 5 ng/L (ref <18)

## 2020-12-07 LAB — CBC WITH DIFFERENTIAL/PLATELET
Abs Immature Granulocytes: 0.05 10*3/uL (ref 0.00–0.07)
Basophils Absolute: 0.1 10*3/uL (ref 0.0–0.1)
Basophils Relative: 1 %
Eosinophils Absolute: 0.6 10*3/uL — ABNORMAL HIGH (ref 0.0–0.5)
Eosinophils Relative: 4 %
HCT: 36.3 % (ref 36.0–46.0)
Hemoglobin: 11.3 g/dL — ABNORMAL LOW (ref 12.0–15.0)
Immature Granulocytes: 0 %
Lymphocytes Relative: 21 %
Lymphs Abs: 3 10*3/uL (ref 0.7–4.0)
MCH: 27.7 pg (ref 26.0–34.0)
MCHC: 31.1 g/dL (ref 30.0–36.0)
MCV: 89 fL (ref 80.0–100.0)
Monocytes Absolute: 1.1 10*3/uL — ABNORMAL HIGH (ref 0.1–1.0)
Monocytes Relative: 8 %
Neutro Abs: 9.4 10*3/uL — ABNORMAL HIGH (ref 1.7–7.7)
Neutrophils Relative %: 66 %
Platelets: 437 10*3/uL — ABNORMAL HIGH (ref 150–400)
RBC: 4.08 MIL/uL (ref 3.87–5.11)
RDW: 13.7 % (ref 11.5–15.5)
WBC: 14.3 10*3/uL — ABNORMAL HIGH (ref 4.0–10.5)
nRBC: 0 % (ref 0.0–0.2)

## 2020-12-07 LAB — APTT: aPTT: 31 seconds (ref 24–36)

## 2020-12-07 LAB — PROTIME-INR
INR: 1 (ref 0.8–1.2)
Prothrombin Time: 12.9 seconds (ref 11.4–15.2)

## 2020-12-07 LAB — BRAIN NATRIURETIC PEPTIDE: B Natriuretic Peptide: 12.6 pg/mL (ref 0.0–100.0)

## 2020-12-07 LAB — D-DIMER, QUANTITATIVE: D-Dimer, Quant: 1.69 ug/mL-FEU — ABNORMAL HIGH (ref 0.00–0.50)

## 2020-12-07 MED ORDER — IOHEXOL 350 MG/ML SOLN
75.0000 mL | Freq: Once | INTRAVENOUS | Status: AC | PRN
  Administered 2020-12-07: 75 mL via INTRAVENOUS

## 2020-12-07 MED ORDER — FUROSEMIDE 40 MG PO TABS
40.0000 mg | ORAL_TABLET | Freq: Every day | ORAL | 0 refills | Status: DC
Start: 2020-12-07 — End: 2021-02-08

## 2020-12-07 NOTE — ED Provider Notes (Addendum)
[facility] Emergency Department Provider Note   ____________________________________________    I have reviewed the triage vital signs and the nursing notes.   HISTORY  Chief Complaint Shortness of Breath     HPI Jordan Chan is a 70 y.o. female who presents with complaints of shortness of breath.  Patient was sent over from cardiologist who obtained labs this morning and evaluating patient's complaint of 2 months of shortness of breath.  Patient has a history of PEs in the past was taken off of blood thinner after 6 months of treatment.  Has felt short of breath for about 1 to 2 months.  D-dimer was elevated at outpatient cardiology office today.  Sent to the emergency department for evaluation  Past Medical History:  Diagnosis Date  . Arthritis   . Asthma   . Breast cancer ([place]) 1998   right breast ca with mastectomy and chemotherapy and radiation  . Bronchitis   . CHF (congestive heart failure) ([place])    "with Morphine withdrawal"  . COPD (chronic obstructive pulmonary disease) ([place])   . Diabetes mellitus without complication ([place])   . Diverticulitis    diverticulosis also  . Dyspnea   . Endometriosis   . GERD (gastroesophageal reflux disease)   . History of shingles 2000-2005  . Hypercholesteremia   . Hypertension   . IBS (irritable bowel syndrome)   . Low back pain    a. Implanted morphine/bupivicaine/clonidine pump.  [name] Neuropathy   . Orthopnea   . Oxygen dependent    uses at night  . Personal history of chemotherapy   . Personal history of radiation therapy   . Pneumonia    pneumonia 5-6 times, history of bronchitis also  . Scoliosis   . Sleep apnea    does not use cpap  . Stroke ([place]) 2010   TIA, 10 years ago  . Withdrawal from sedative drug ([place])    withdrawal from morphine when pump batteries died    Patient Active Problem List   Diagnosis Date Noted  . Type 2 diabetes, controlled, with neuropathy ([place])  07/20/2020  . Insulin dependent type 2 diabetes mellitus ([place]) 07/20/2020  . SOB (shortness of breath) 07/04/2020  . History of pulmonary embolus (PE) 07/04/2020  . Aortic atherosclerosis ([place]) 07/01/2020  . SVT (supraventricular tachycardia) ([place])   . Vitamin D deficiency 07/17/2019  . Stress-induced cardiomyopathy 07/23/2017  . History of non-ST elevation myocardial infarction (NSTEMI)   . Presence of intrathecal pump 04/27/2017  . Advanced care planning/counseling discussion 11/14/2016  . Cervical scoliosis 05/20/2016  . Hypertension associated with diabetes ([place]) 06/18/2015  . B12 deficiency 03/19/2015  . Sleep apnea 11/16/2014  . Allergic rhinitis 11/16/2014  . History of breast cancer 11/16/2014  . GERD (gastroesophageal reflux disease) 11/16/2014  . COPD, moderate ([place]) 11/16/2014  . Obesity 11/16/2014  . Depression, major, single episode, in partial remission ([place]) 11/16/2014  . Insomnia 11/16/2014  . Hyperlipidemia associated with type 2 diabetes mellitus ([place]) 11/16/2014  . History of sarcoma 09/27/2013  . Chronic pain syndrome 09/30/2012    Past Surgical History:  Procedure Laterality Date  . ABDOMINAL HYSTERECTOMY  1987  . BACK SURGERY     Tailbone removed following fracture  . BREAST SURGERY Right    mastectomy  . CARDIAC CATHETERIZATION    . CATARACT EXTRACTION W/PHACO Right 05/19/2019   Procedure: CATARACT EXTRACTION PHACO AND INTRAOCULAR LENS PLACEMENT ([place]), RIGHT, DIABETIC;  Surgeon: Marchia Meiers, MD;  Location: [facility] ORS;  Service: Ophthalmology;  Laterality: Right;  Lot # [id number] H [place]: 00:43.8 CDE: 4.59  . CATARACT EXTRACTION W/PHACO Left 06/16/2019   Procedure: CATARACT EXTRACTION PHACO AND INTRAOCULAR LENS PLACEMENT ([place]) LEFT VISION BLUE DIABETIC;  Surgeon: Marchia Meiers, MD;  Location: [facility] ORS;  Service: Ophthalmology;  Laterality: Left;  Lot #[id number] H [place]: 00:46.9 CDE: 6.53  . COCCYX REMOVAL    . COLONOSCOPY WITH PROPOFOL N/A 01/01/2017   Procedure:  COLONOSCOPY WITH PROPOFOL;  Surgeon: Jonathon Bellows, MD;  Location: [facility] ENDOSCOPY;  Service: Endoscopy;  Laterality: N/A;  . ELBOW ARTHROSCOPY WITH TENDON RECONSTRUCTION    . ESOPHAGOGASTRODUODENOSCOPY (EGD) WITH PROPOFOL N/A 01/01/2017   Procedure: ESOPHAGOGASTRODUODENOSCOPY (EGD) WITH PROPOFOL;  Surgeon: Jonathon Bellows, MD;  Location: [facility] ENDOSCOPY;  Service: Endoscopy;  Laterality: N/A;  . INTRATHECAL PUMP IMPLANT    . LEFT HEART CATH AND CORONARY ANGIOGRAPHY N/A 04/28/2017   Procedure: LEFT HEART CATH AND CORONARY ANGIOGRAPHY;  Surgeon: [name], MD;  Location: [facility] CV LAB;  Service: Cardiovascular;  Laterality: N/A;  . MASTECTOMY Right 06/1997  . morphine pump  2011  . PLANTAR FASCIA RELEASE    . TRIGGER FINGER RELEASE      Prior to Admission medications   Medication Sig Start Date End Date Taking? Authorizing Provider  atorvastatin (LIPITOR) 20 MG tablet Take 1 tablet (20 mg total) by mouth daily. 10/25/20  Yes [name], Jolene T, NP  cholecalciferol (VITAMIN D3) 25 MCG (1000 UNIT) tablet Take 1,000 Units by mouth daily.   Yes [provider]  diclofenac (VOLTAREN) 50 MG EC tablet TAKE 1 TABLET THREE TIMES DAILY 11/12/20  Yes [name], Jolene T, NP  furosemide (LASIX) 40 MG tablet Take 1 tablet (40 mg total) by mouth daily. 12/07/20 06/05/21 Yes End, Harrell Gave, MD  gabapentin (NEURONTIN) 800 MG tablet Take 1 tablet (800 mg total) by mouth 3 (three) times daily. 10/25/20  Yes [name], Jolene T, NP  lisinopril (ZESTRIL) 2.5 MG tablet Take 1 tablet (2.5 mg total) by mouth daily. 11/15/20 05/14/21 Yes Loel Dubonnet, NP  lisinopril (ZESTRIL) 5 MG tablet Take 5 mg by mouth daily. 11/15/20  Yes [provider]  metFORMIN (GLUCOPHAGE) 1000 MG tablet Take 1 tablet (1,000 mg total) by mouth 2 (two) times daily with a meal. 10/25/20  Yes [name], Jolene T, NP  metoprolol succinate (TOPROL-XL) 25 MG 24 hr tablet Take 0.5 tablets (12.5 mg total) by mouth daily. 10/25/20  Yes  [name], Jolene T, NP  TRELEGY ELLIPTA 100-62.5-25 MCG/INH AEPB INHALE 1 PUFF INTO THE LUNGS DAILY. 12/03/20  Yes [name], Jolene T, NP  TRESIBA FLEXTOUCH 100 UNIT/ML FlexTouch Pen INJECT 30 UNITS INTO THE SKIN AT BEDTIME. Patient taking differently: Inject 40 Units into the skin at bedtime. 02/20/20  Yes [name], Jolene T, NP  venlafaxine XR (EFFEXOR-XR) 150 MG 24 hr capsule Take 1 capsule (150 mg total) by mouth daily. 10/22/20  Yes [name], Jolene T, NP  Accu-Chek Softclix Lancets lancets TEST BLOOD SUGAR THREE TIMES DAILY 08/15/20   [name], Henrine Screws T, NP  albuterol (PROVENTIL HFA;VENTOLIN HFA) 108 (90 BASE) MCG/ACT inhaler Inhale 2 puffs into the lungs every 6 (six) hours as needed for wheezing or shortness of breath.     [provider]  Alcohol Swabs (B-D SINGLE USE SWABS REGULAR) PADS USE TWICE DAILY  WITH  SUGAR  CHECKS 04/04/20   [name], Jolene T, NP  Blood Glucose Monitoring Suppl (TRUE METRIX METER) w/Device KIT Use to check blood sugar 4 times a day 10/31/20   [name]  T, NP  Cyanocobalamin 1000 MCG/ML KIT Inject 1,000 mcg as directed every 30 (thirty) days.    [provider]  cyclobenzaprine (FLEXERIL) 5 MG tablet Take 5 mg by mouth 2 (two) times daily as needed for muscle spasms. Takes very rarely    [provider]  Dulaglutide (TRULICITY) 4.5 MW/1.0UV SOPN Inject 4.5 mg as directed once a week. 10/25/20   [name], Jolene T, NP  glucose blood (TRUE METRIX BLOOD GLUCOSE TEST) test strip To check blood sugar 3 times a day. 07/26/20   [name], Henrine Screws T, NP  lactulose (CHRONULAC) 10 GM/15ML solution Take 45 mLs (30 g total) by mouth daily as needed for mild constipation or severe constipation. Patient not taking: No sig reported 12/20/19   Loletha Grayer, MD  montelukast (SINGULAIR) 10 MG tablet Take 10 mg by mouth at bedtime. Patient not taking: No sig reported    [provider]  PAIN MANAGEMENT INTRATHECAL, IT, PUMP 1 each by Intrathecal route.  Intrathecal (IT) medication:  Morphine Patient does not remember current. Adjusted every 2.5 months at [facility] in [place].    [provider]     Allergies Other, Pain patch [menthol], Fentanyl, Avelox [moxifloxacin hcl in nacl], Doxycycline, Erythromycin, Moxifloxacin hcl, Oxycontin [oxycodone], and Ozempic [semaglutide]  Family History  Problem Relation Age of Onset  . Cancer Mother 36       lung  . Thyroid disease Mother   . Cancer Father 2       Pancreatic  . Hypertension Sister   . Cancer Sister        "cancer on face"  . Hyperlipidemia Sister   . Osteoporosis Maternal Grandmother   . Hyperlipidemia Son   . Seizures Son   . Cancer Paternal Grandmother        colon  . Arthritis Paternal Grandfather   . Breast cancer Neg Hx     Social History Social History   Tobacco Use  . Smoking status: Former Smoker    Packs/day: 1.00    Years: 30.00    Pack years: 30.00    Types: Cigarettes    Quit date: 04/30/2004    Years since quitting: 16.6  . Smokeless tobacco: Never Used  Vaping Use  . Vaping Use: Never used  Substance Use Topics  . Alcohol use: No  . Drug use: No    Review of Systems  Constitutional: No fever/chills Eyes: No visual changes.  ENT: No sore throat. Cardiovascular: Denies chest pain. Respiratory: As above Gastrointestinal: No abdominal pain. .   Genitourinary: Negative for dysuria. Musculoskeletal: Negative for back pain. Skin: Negative for rash. Neurological: Negative for headaches   ____________________________________________   PHYSICAL EXAM:  VITAL SIGNS: ED Triage Vitals  Enc Vitals Group     BP 12/07/20 1215 127/69     Pulse Rate 12/07/20 1217 (!) 114     Resp 12/07/20 1215 (!) 24     Temp 12/07/20 1217 99.5 F (37.5 C)     Temp Source 12/07/20 1217 Oral     SpO2 12/07/20 1215 95 %     Weight 12/07/20 1216 70.8 kg (156 lb)     Height 12/07/20 1216 1.473 m (4' 10")     Head Circumference --       Peak Flow --      Pain Score 12/07/20 1215 0     Pain Loc --      Pain Edu? --      Excl. in [place]? --  Constitutional: Alert and oriented. No acute distress. Pleasant and interactive  Nose: No congestion/rhinnorhea. Mouth/Throat: Mucous membranes are moist.   Neck:  Painless ROM Cardiovascular: Tachycardia, regular rhythm. Grossly normal heart sounds.  Good peripheral circulation. Respiratory: Mildly increased respiratory effort with tachypnea no retractions. Lungs CTAB. Gastrointestinal: Soft and nontender. No distention.  No CVA tenderness.  Musculoskeletal: No lower extremity tenderness nor edema.  Warm and well perfused Neurologic:  Normal speech and language. No gross focal neurologic deficits are appreciated.  Skin:  Skin is warm, dry and intact. No rash noted. Psychiatric: Mood and affect are normal. Speech and behavior are normal.  ____________________________________________   LABS (all labs ordered are listed, but only abnormal results are displayed)  Labs Reviewed  APTT  PROTIME-INR  TROPONIN I (HIGH SENSITIVITY)   ____________________________________________  EKG  ED ECG REPORT I, Lavonia Drafts, the attending physician, personally viewed and interpreted this ECG.  Date: 12/07/2020  Rhythm: Sinus tach QRS Axis: normal Intervals: Abnormal ST/T Wave abnormalities: normal Narrative Interpretation: no evidence of acute ischemia  ____________________________________________  RADIOLOGY  CT angiography unfortunately IV contrast extravasation occurred during CT.  Extravasation order set initiated.  Patient is at high risk for PE, separate IV placed for study ____________________________________________   PROCEDURES  Procedure(s) performed: No  Procedures   Critical Care performed: No ____________________________________________   INITIAL IMPRESSION / ASSESSMENT AND PLAN / ED COURSE  Pertinent labs & imaging results that were available during  my care of the patient were reviewed by me and considered in my medical decision making (see chart for details).  Patient presents with tachycardia, shortness of breath with a history of PE.  She is not anticoagulated.  She had an elevated D-dimer as an outpatient today.  High risk for PE, will send for CTA  Please see note above, patient did have extravasation, given high risk for PE will need to try for CTA second time  CT angiography negative for PE.  Patient relieved to hear this, will have her follow-up with Dr. Saunders Revel for further evaluation of chronic shortness of breath    ____________________________________________   FINAL CLINICAL IMPRESSION(S) / ED DIAGNOSES  Final diagnoses:  SOB (shortness of breath)        Note:  This document was prepared using Dragon voice recognition software and may include unintentional dictation errors.   Lavonia Drafts, MD 12/07/20 1415    Lavonia Drafts, MD 12/07/20 1530

## 2020-12-07 NOTE — Telephone Encounter (Signed)
Patient scheduled for follow-up with me today. She was seen at cardiology and advised to go to ED.  Will outreach patient next week and left message on mobile indicating this.

## 2020-12-07 NOTE — Telephone Encounter (Signed)
Thank you Dr. Saunders Revel.  I have been tracking her platelets and WBC, she was to return for labs so I am glad you got those labs today.  Definitely would benefit further work-up and possible hematology inpatient too due to these ongoing elevations.  I appreciate you.

## 2020-12-07 NOTE — Progress Notes (Signed)
Interventional Radiology Progress Note  70 yo female in the ED, undergoing eval and with order for CTA of chest.   Left AC IV infiltration, with estimate of ~40cc contrast.  Patient complains of pain.  Motor sensory intact on left, non-dominant hand.  Strong radial pulse. Hand warm, strength intact.  Firm swelling at the [facility] IV site.  No bulla or blister formation.  Small ecchymosis from prior IV.   Discussed IV infiltration, and the need to monitor for any new symptoms such as cold hand, weakness, skin breakdown or blister formation.  Any new symptoms would necessitate care in the ED.  Patient understands.  Conservative care with elevation, warm/cold compress, external compression.   Signed,  Dulcy Fanny. Earleen Newport, DO

## 2020-12-07 NOTE — Telephone Encounter (Signed)
Labs obtained after this AM's visit were reviewed and are notable for elevated d-dimer as well as persistent leukocytosis.  Notably, BNP was normal, suggesting that her symptoms are not driven primarily by heart failure.  I remain concerned about recurrent PE.  I spoke with Jordan Chan and advised her to go to the ED as soon as possible for further workup, including CTA chest to rule out PE.  I have informed the [facility] ED triage nurse about the patient's history and my recommendations.  [name], MD [facility]

## 2020-12-07 NOTE — ED Notes (Signed)
Patient transported to CT 

## 2020-12-07 NOTE — Patient Instructions (Signed)
Medication Instructions:  Your physician has recommended you make the following change in your medication:   INCREASE Lasix to 40 mg daily    *If you need a refill on your cardiac medications before your next appointment, please call your pharmacy*   Lab Work: STAT D-Dimer, Cbc, Bmp, Bnp to be drawn today at the [facility]. Please stop at the registration desk to check in.  If you have labs (blood work) drawn today and your tests are completely normal, you will receive your results only by: [name] MyChart Message (if you have MyChart) OR . A paper copy in the mail If you have any lab test that is abnormal or we need to change your treatment, we will call you to review the results.   Testing/Procedures: Your physician has requested that you have an echocardiogram. Echocardiography is a painless test that uses sound waves to create images of your heart. It provides your doctor with information about the size and shape of your heart and how well your heart's chambers and valves are working. This procedure takes approximately one hour. There are no restrictions for this procedure.     Follow-Up: At [facility], you and your health needs are our priority.  As part of our continuing mission to provide you with exceptional heart care, we have created designated Provider Care Teams.  These Care Teams include your primary Cardiologist (physician) and Advanced Practice Providers (APPs -  Physician Assistants and Nurse Practitioners) who all work together to provide you with the care you need, when you need it.  We recommend signing up for the patient portal called "MyChart".  Sign up information is provided on this After Visit Summary.  MyChart is used to connect with patients for Virtual Visits (Telemedicine).  Patients are able to view lab/test results, encounter notes, upcoming appointments, etc.  Non-urgent messages can be sent to your provider as well.   To learn more about what you can do  with MyChart, go to [email or web address].    Your next appointment:   4 week(s)  The format for your next appointment:   In Person  Provider:   You may see [name], MD or one of the following Advanced Practice Providers on your designated Care Team:    Murray Hodgkins, NP  [name], PA-C  Marrianne Mood, PA-C  Cadence Lowell, [place]  Laurann Montana, NP    Other Instructions N/A

## 2020-12-07 NOTE — ED Triage Notes (Signed)
Pt to ED for shob x 2 months. Sent from cardio for elevated d dimer. Hx CHF Speaking in complete sentences Wears o2 at night  Hx PE

## 2020-12-07 NOTE — Progress Notes (Signed)
Follow-up Outpatient Visit Date: 12/07/2020  Primary Care Provider: [name], NP [place] [address]  Chief Complaint: Not feeling well  HPI:  Ms. Klebba is a 70 y.o. female with history of stress-induced cardiomyopathy in 04/2017 in the setting of pain pump malfunction and opioid withdrawal, PEfollowing hospitalization for pneumonia (summer 2021),chronic low back pain, diabetes mellitus, COPD,GERD, sleep apnea, and IBS, who presents for follow-up of cardiomyopathy and shortness of breath.  She was last seen in our office in mid April, at which time she reported continued shortness of breath that did not improve with prednisone taper and addition of Singulair by her pulmonologist.  She endorsed chest tightness associated with her shortness of breath as well as palpitations.  She was noted have put on 17 pounds in 4.5 months with concern for fluid retention.  She was started on furosemide 20 mg daily.  Lisinopril was decreased to 2.5 mg daily to minimize risk of hypotension.  Preceding TTE in 07/2020 showed LVEF of 45-50% with global hypokinesis and grade 2 diastolic dysfunction.  Today, Ms. Villacres reports that she has not been feeling well since January.  She feels tired and short of breath all the time.  Her weight is also up about 20 pounds since last fall.  She has not noticed much improvement with addition of furosemide 20 mg daily.  She denies chest pain.  She has occasional edema in her feet as well as palpitations as if her heart is beating harder and faster than normal.  She denies lightheadedness/syncope.  She is getting depressed because of her symptoms.  Ms. Hutmacher was previously on apixaban for her PE diagnosed last summer, though treatment was stopped by [name] a couple of months ago after having completed at least 6 months of  anticoagulation.  --------------------------------------------------------------------------------------------------  Cardiovascular History & Procedures: Cardiovascular Problems:  Stress-induced cardiomyopathy  Pulmonary embolism  Risk Factors:  Diabetes mellitus, prior tobacco use, and age greater than 71  Cath/PCI:  LHC (04/28/17): No angiographically significant CAD. Severely reduced LVEF (25-30%) with mid and apical akinesis consistent with stress-induced cardiomyopathy. Mildly elevated left ventricular filling pressure.  CV Surgery:  None  EP Procedures and Devices:  None  Non-Invasive Evaluation(s):  Limited TTE (08/13/17): LVEF 50-55%.  TTE (05/15/17,[facility], [place]):Normal LV size and wall thickness. LVEF 45-50% with global hypokinesis. Grade 1 diastolic dysfunction. Normal RV size and function.  TTE (04/28/17): Upper normal LV size with normal wall thickness. LVEF severely reduced at 25-30%. There is severe hypokinesis of the mid and apical myocardium suggestive of stress-induced cardiomyopathy. Mild left atrial enlargement. Poorly visualized RV, grossly normal in size.   Recent CV Pertinent Labs: Lab Results  Component Value Date   CHOL 151 10/25/2020   CHOL 127 07/26/2019   HDL 73 10/25/2020   LDLCALC 59 10/25/2020   TRIG 104 10/25/2020   TRIG 104 07/26/2019   INR 1.2 12/11/2019   BNP 546.0 (H) 12/12/2019   K 5.2 11/29/2020   K 3.5 09/14/2013   MG 2.0 12/19/2019   BUN 11 11/29/2020   BUN 8 09/14/2013   CREATININE 0.82 11/29/2020   CREATININE 0.68 09/14/2013    Past medical and surgical history were reviewed and updated in EPIC.  Current Meds  Medication Sig  . Accu-Chek Softclix Lancets lancets TEST BLOOD SUGAR THREE TIMES DAILY  . albuterol (PROVENTIL HFA;VENTOLIN HFA) 108 (90 BASE) MCG/ACT inhaler Inhale 2 puffs into the lungs every 6 (six) hours as needed for wheezing  or shortness of breath.   . Alcohol Swabs (B-D  SINGLE USE SWABS REGULAR) PADS USE TWICE DAILY  WITH  SUGAR  CHECKS  . atorvastatin (LIPITOR) 20 MG tablet Take 1 tablet (20 mg total) by mouth daily.  . Blood Glucose Monitoring Suppl (TRUE METRIX METER) w/Device KIT Use to check blood sugar 4 times a day  . cholecalciferol (VITAMIN D3) 25 MCG (1000 UNIT) tablet Take 1,000 Units by mouth daily.  . Cyanocobalamin 1000 MCG/ML KIT Inject 1,000 mcg as directed every 30 (thirty) days.  . cyclobenzaprine (FLEXERIL) 5 MG tablet Take 5 mg by mouth 2 (two) times daily as needed for muscle spasms. Takes very rarely  . diclofenac (VOLTAREN) 50 MG EC tablet TAKE 1 TABLET THREE TIMES DAILY  . Dulaglutide (TRULICITY) 4.5 QX/4.5WT SOPN Inject 4.5 mg as directed once a week.  . furosemide (LASIX) 20 MG tablet Take 1 tablet (20 mg total) by mouth daily.  [name] gabapentin (NEURONTIN) 800 MG tablet Take 1 tablet (800 mg total) by mouth 3 (three) times daily.  [name] glucose blood (TRUE METRIX BLOOD GLUCOSE TEST) test strip To check blood sugar 3 times a day.  . lactulose (CHRONULAC) 10 GM/15ML solution Take 45 mLs (30 g total) by mouth daily as needed for mild constipation or severe constipation.  [name] lisinopril (ZESTRIL) 2.5 MG tablet Take 1 tablet (2.5 mg total) by mouth daily.  . metFORMIN (GLUCOPHAGE) 1000 MG tablet Take 1 tablet (1,000 mg total) by mouth 2 (two) times daily with a meal.  . metoprolol succinate (TOPROL-XL) 25 MG 24 hr tablet Take 0.5 tablets (12.5 mg total) by mouth daily.  . montelukast (SINGULAIR) 10 MG tablet Take 10 mg by mouth at bedtime.  [name] PAIN MANAGEMENT INTRATHECAL, IT, PUMP 1 each by Intrathecal route. Intrathecal (IT) medication:  Morphine Patient does not remember current. Adjusted every 2.5 months at [facility] in [place].  . TRELEGY ELLIPTA 100-62.5-25 MCG/INH AEPB INHALE 1 PUFF INTO THE LUNGS DAILY.  [name] TRESIBA FLEXTOUCH 100 UNIT/ML FlexTouch Pen INJECT 30 UNITS INTO THE SKIN AT BEDTIME.  [name] venlafaxine XR (EFFEXOR-XR)  150 MG 24 hr capsule Take 1 capsule (150 mg total) by mouth daily.    Allergies: Other, Pain patch [menthol], Fentanyl, Avelox [moxifloxacin hcl in nacl], Doxycycline, Erythromycin, Moxifloxacin hcl, Oxycontin [oxycodone], and Ozempic [semaglutide]  Social History   Tobacco Use  . Smoking status: Former Smoker    Packs/day: 1.00    Years: 30.00    Pack years: 30.00    Types: Cigarettes    Quit date: 04/30/2004    Years since quitting: 16.6  . Smokeless tobacco: Never Used  Vaping Use  . Vaping Use: Never used  Substance Use Topics  . Alcohol use: No  . Drug use: No    Family History  Problem Relation Age of Onset  . Cancer Mother 84       lung  . Thyroid disease Mother   . Cancer Father 26       Pancreatic  . Hypertension Sister   . Cancer Sister        "cancer on face"  . Hyperlipidemia Sister   . Osteoporosis Maternal Grandmother   . Hyperlipidemia Son   . Seizures Son   . Cancer Paternal Grandmother        colon  . Arthritis Paternal Grandfather   . Breast cancer Neg Hx     Review of Systems: Patient notes persistent leukocytosis with plans for repeat CBC later this  month and referral to hematology if still elevated.  Otherwise, a 12-system review of systems was performed and was negative except as noted in the HPI.  --------------------------------------------------------------------------------------------------  Physical Exam: BP 120/70 (BP Location: Left Arm, Patient Position: Sitting, Cuff Size: Normal)   Pulse (!) 102   Ht 4' 10.5" (1.486 m)   Wt 158 lb (71.7 kg)   LMP  (LMP Unknown)   SpO2 95%   BMI 32.46 kg/m   General:  Tearful woman, seated in a wheelchair. Neck: JVP ~10 cm with positive HJR. Lungs: Mildly diminished breath sounds throughout without wheezes or crackles. Heart: Tachycardic but regular without murmurs, rubs, or gallops. Abdomen: Soft, nontender, nondistended. Extremities: Trace pretibial edema.  EKG:  Sinus tachycardia with  left axis deviation and incomplete LBBB.  Compared with prior tracing from 11/15/2020, left axis deviation is new.  Otherwise, there has been no significant change.  Lab Results  Component Value Date   WBC 11.3 (H) 11/29/2020   HGB 11.9 11/29/2020   HCT 37.2 11/29/2020   MCV 88 11/29/2020   PLT 497 (H) 11/29/2020    Lab Results  Component Value Date   NA 138 11/29/2020   K 5.2 11/29/2020   CL 92 (L) 11/29/2020   CO2 25 11/29/2020   BUN 11 11/29/2020   CREATININE 0.82 11/29/2020   GLUCOSE 149 (H) 11/29/2020   ALT 10 11/29/2020    Lab Results  Component Value Date   CHOL 151 10/25/2020   HDL 73 10/25/2020   LDLCALC 59 10/25/2020   TRIG 104 10/25/2020    --------------------------------------------------------------------------------------------------  ASSESSMENT AND PLAN: Shortness of breath, fatigue, and acute on chronic HRrEF: Ms. Cheek has not felt well for several months with significant fatigue and shortness of breath.  This has not improved significant despite a course of corticosteroids and addition of low-dose furosemide.  She has only minimal edema on exam today but significant JVD.  She is also tachycardic today with relatively stable EKG from last month but noticeably different compared with 07/2020.  I am concern about recurrent PE and/or right heart failure underlying her symptoms.  We discussed referring her for urgent CTA, but given that she has had a lot of radiation and contrast exposure, we have agreed to get a d-dimer instead.  If this is elevated, she will need to be referred for urgent CTA and/or proceed to the ED for further evaluation.  I will also check a CBC and BMP today.  We will increase furosemide to 40 mg daily and repeat an echo.  My suspicion for significant ischemic heart disease is low, given lack of chest pain and clean catheterization in 2018.  However, if symptoms persist without obvious explanation, we will need to consider R/LHC to reassess  her coronary arteries and also assess her filling and pulmonary artery pressures, as she is at risk for pulmonary hypertension in the setting of prior PE.  Hypertension associated with type 2 diabetes mellitus: BP normal today.  Continue current medications.  Follow-up: Return to clinic in 1 month.  [name], MD 12/07/2020 9:22 AM

## 2020-12-07 NOTE — ED Notes (Signed)
Pt's IV infiltrated at CT during contrast injection. Warm compress applied to left arm.

## 2020-12-11 ENCOUNTER — Other Ambulatory Visit: Payer: Self-pay

## 2020-12-11 ENCOUNTER — Ambulatory Visit (INDEPENDENT_AMBULATORY_CARE_PROVIDER_SITE_OTHER): Payer: Medicare HMO

## 2020-12-11 DIAGNOSIS — I5023 Acute on chronic systolic (congestive) heart failure: Secondary | ICD-10-CM | POA: Diagnosis not present

## 2020-12-11 DIAGNOSIS — R0602 Shortness of breath: Secondary | ICD-10-CM | POA: Diagnosis not present

## 2020-12-11 LAB — ECHOCARDIOGRAM COMPLETE
AR max vel: 1.89 cm2
AV Area VTI: 1.82 cm2
AV Area mean vel: 1.76 cm2
AV Mean grad: 4 mmHg
AV Peak grad: 7.1 mmHg
Ao pk vel: 1.33 m/s
Area-P 1/2: 4.26 cm2
Calc EF: 54.7 %
MV VTI: 2.19 cm2
S' Lateral: 3.6 cm
Single Plane A2C EF: 46.9 %
Single Plane A4C EF: 61.8 %

## 2020-12-12 ENCOUNTER — Other Ambulatory Visit: Payer: Medicare HMO

## 2020-12-12 ENCOUNTER — Telehealth: Payer: Self-pay | Admitting: Pharmacist

## 2020-12-12 ENCOUNTER — Telehealth: Payer: Self-pay

## 2020-12-12 DIAGNOSIS — D72820 Lymphocytosis (symptomatic): Secondary | ICD-10-CM | POA: Diagnosis not present

## 2020-12-12 DIAGNOSIS — R7989 Other specified abnormal findings of blood chemistry: Secondary | ICD-10-CM

## 2020-12-12 NOTE — Progress Notes (Incomplete)
Chronic Care Management Pharmacy Note  12/07/2020 Name:  Jordan Chan MRN:  [id number] DOB:  07-03-1951  Subjective: Jordan Chan is an 70 y.o. year old female who is a primary patient of [name], [name], NP.  The CCM team was consulted for assistance with disease management and care coordination needs.    Engaged with patient by telephone for follow up visit in response to provider referral for pharmacy case management and/or care coordination services.   Consent to Services:  The patient was given information about Chronic Care Management services, agreed to services, and gave verbal consent prior to initiation of services.  Please see initial visit note for detailed documentation.   Patient Care Team: [name], NP as PCP - General (Nurse Practitioner) End, Harrell Gave, MD as PCP - Cardiology (Cardiology) Erby Pian, MD as Referring Physician (Pulmonary Disease) Kandace Blitz, MD as Referring Physician (Pain Medicine) Greg Cutter, LCSW as Social Worker (Licensed Clinical Social Worker) Vladimir Faster, [facility] as Pharmacist (Pharmacist)  Recent office visits: 12/04/20-B12 injection 3.24.22- [name] (PCP)-blood work, increase Trulicity to 9.9IP  Recent consult visits: 12/11/20- [facility] (cards)- ECHO LVEF 45% 5/6/220 End (cards)- blood worrk , ECG= new left axis deviation, D-diner ordered, concern for PE rt heart failure, will need CTA if positive, increase lasix to 40 mg qd D-dimer 1.69 referred to ED for CTA chest 11/21/20 [facility] (pulmonology)-continue trelegy, albuterol, singulair 11/15/20 Gilford Rile, NP (cards)-ekg, furosemide 20 mg , lisinopril 2.24m  10/18/20 [facility] (Pain medicine) intrathecal pump refill and reprogramming, morphine 2.50275mday, bupiv 10.09 mg/day, clonidine 10.05 mcg/day (no changes)  Hospital visits: 12/07/20- ED visit - CTA chest r/o PE negative  Objective:  Lab Results  Component Value Date   CREATININE 0.82  11/29/2020   BUN 11 11/29/2020   GFRNONAA 80 07/26/2020   GFRAA 92 07/26/2020   NA 138 11/29/2020   K 5.2 11/29/2020   CALCIUM 9.6 11/29/2020   CO2 25 11/29/2020   GLUCOSE 149 (H) 11/29/2020    Lab Results  Component Value Date/Time   HGBA1C 8.7 (H) 10/25/2020 08:43 AM   HGBA1C 7.1 (H) 07/26/2020 09:00 AM   HGBA1C 7.7% 04/28/2016 12:00 AM   MICROALBUR 10 10/25/2020 08:43 AM   MICROALBUR 10 11/15/2019 09:38 AM    Last diabetic Eye exam:  Lab Results  Component Value Date/Time   HMDIABEYEEXA No Retinopathy 03/14/2020 12:00 AM    Last diabetic Foot exam: No results found for: HMDIABFOOTEX   Lab Results  Component Value Date   CHOL 151 10/25/2020   HDL 73 10/25/2020   LDLCALC 59 10/25/2020   TRIG 104 10/25/2020    Hepatic Function Latest Ref Rng & Units 11/29/2020 10/25/2020 12/12/2019  Total Protein 6.0 - 8.5 g/dL 8.1 7.5 6.8  Albumin 3.8 - 4.8 g/dL 4.4 4.3 2.6(L)  AST 0 - 40 IU/L 16 17 17   ALT 0 - 32 IU/L 10 16 11   Alk Phosphatase 44 - 121 IU/L 89 109 69  Total Bilirubin 0.0 - 1.2 mg/dL <0.2 0.2 0.7    Lab Results  Component Value Date/Time   TSH 3.100 07/26/2020 09:02 AM    CBC Latest Ref Rng & Units 12/07/2020 11/29/2020 10/25/2020  WBC 4.0 - 10.5 K/uL 14.3(H) 11.3(H) 13.2(H)  Hemoglobin 12.0 - 15.0 g/dL 11.3(L) 11.9 11.8  Hematocrit 36.0 - 46.0 % 36.3 37.2 36.7  Platelets 150 - 400 K/uL 437(H) 497(H) 455(H)    Lab Results  Component Value Date/Time   VD25OH 33.4 10/25/2020 08:45  AM   VD25OH 31.7 07/26/2019 08:54 AM    Clinical ASCVD: Yes  The 10-year ASCVD risk score Mikey Bussing DC Jr., et al., 2013) is: 18%   Values used to calculate the score:     Age: 52 years     Sex: Female     Is Non-Hispanic African American: No     Diabetic: Yes     Tobacco smoker: No     Systolic Blood Pressure: 366 mmHg     Is BP treated: Yes     HDL Cholesterol: 73 mg/dL     Total Cholesterol: 151 mg/dL    Depression screen [facility] 2/9 10/25/2020 07/26/2020 03/28/2020  Decreased  Interest 0 0 0  Down, Depressed, Hopeless 0 0 0  PHQ - 2 Score 0 0 0  Altered sleeping 3 0 3  Tired, decreased energy 3 0 3  Change in appetite 0 0 1  Feeling bad or failure about yourself  0 0 0  Trouble concentrating 0 0 1  Moving slowly or fidgety/restless 0 0 0  Suicidal thoughts 0 0 0  PHQ-9 Score 6 0 8  Difficult doing work/chores Not difficult at all Not difficult at all Not difficult at all  Some recent data might be hidden       Social History   Tobacco Use  Smoking Status Former Smoker  . Packs/day: 1.00  . Years: 30.00  . Pack years: 30.00  . Types: Cigarettes  . Quit date: 04/30/2004  . Years since quitting: 16.6  Smokeless Tobacco Never Used   BP Readings from Last 3 Encounters:  12/07/20 120/70  11/15/20 118/72  10/25/20 111/71   Pulse Readings from Last 3 Encounters:  12/07/20 (!) 102  11/15/20 (!) 105  10/25/20 98   Wt Readings from Last 3 Encounters:  12/07/20 158 lb (71.7 kg)  11/15/20 157 lb (71.2 kg)  10/25/20 156 lb 3.2 oz (70.9 kg)   BMI Readings from Last 3 Encounters:  12/07/20 32.46 kg/m  11/15/20 32.25 kg/m  10/25/20 32.09 kg/m    Assessment/Interventions: Review of patient past medical history, allergies, medications, health status, including review of consultants reports, laboratory and other test data, was performed as part of comprehensive evaluation and provision of chronic care management services.   SDOH:  (Social Determinants of Health) assessments and interventions performed: No  SDOH Screenings   Alcohol Screen: Not on file  Depression (PHQ2-9): Medium Risk  . PHQ-2 Score: 6  Financial Resource Strain: Not on file  Food Insecurity: Not on file  Housing: Not on file  Physical Activity: Not on file  Social Connections: Not on file  Stress: Stress Concern Present  . Feeling of Stress : Rather much  Tobacco Use: Medium Risk  . Smoking Tobacco Use: Former Smoker  . Smokeless Tobacco Use: Never Used  Transportation  Needs: Not on file     Immunization History  Administered Date(s) Administered  . Fluad Quad(high Dose 65+) 04/19/2019, 07/26/2020  . Influenza, High Dose Seasonal PF 04/28/2016, 04/13/2017, 04/14/2018  . Influenza,inj,Quad PF,6+ Mos 06/18/2015  . Influenza-Unspecified 04/04/2014  . PFIZER(Purple Top)SARS-COV-2 Vaccination 10/20/2019, 11/10/2019, 05/31/2020  . Pneumococcal Conjugate-13 06/18/2015  . Pneumococcal Polysaccharide-23 08/06/2005, 08/05/2016  . Td 08/28/2007, 01/17/2019    Conditions to be addressed/monitored:  Hypertension, Hyperlipidemia, Diabetes, Heart Failure, GERD, COPD, Depression and Chronic pain   There are no care plans that you recently modified to display for this patient.    Medication Assistance: None required.  Patient affirms current coverage meets  needs.  Patient's preferred pharmacy is:  [place], [place] [address] Phone: [phone] Fax: [phone]  [place], [place] - [address] [place] [address] Phone: [phone] Fax: [phone]  Uses pill box? {Yes or If no, why not?:20788} Pt endorses ***% compliance  We discussed: {Pharmacy options:24294} Patient decided to: {US Pharmacy Plan:23885}  Care Plan and Follow Up Patient Decision:  {FOLLOWUP:24991}  Plan: {CM FOLLOW UP UXYB:33832}  ***  Current Barriers:  . {pharmacybarriers:24917}  Pharmacist Clinical Goal(s):  [name] Patient will {PHARMACYGOALCHOICES:24921} through collaboration with PharmD and provider.   Interventions: . 1:1 collaboration with [name], NP regarding development and update of comprehensive plan of care as evidenced by provider attestation and co-signature . Inter-disciplinary care team collaboration (see longitudinal plan of care) . Comprehensive medication review performed; medication list updated in electronic medical  record  Hypertension (BP goal {CHL HP UPSTREAM Pharmacist BP ranges:[phone]}) -{US controlled/uncontrolled:25276} -Current treatment: . *** -Medications previously tried: ***  -Current home readings: *** -Current dietary habits: *** -Current exercise habits: *** -{ACTIONS;DENIES/REPORTS:21021675::"Denies"} hypotensive/hypertensive symptoms -Educated on {CCM BP Counseling:25124} -Counseled to monitor BP at home ***, document, and provide log at future appointments -{CCMPHARMDINTERVENTION:25122}  Diabetes (A1c goal {A1c goals:23924}) -{US controlled/uncontrolled:25276} -Current medications: . *** -Medications previously tried: ***  -Current home glucose readings . fasting glucose: *** . post prandial glucose: *** -{ACTIONS;DENIES/REPORTS:21021675::"Denies"} hypoglycemic/hyperglycemic symptoms -Current meal patterns:  . breakfast: ***  . lunch: ***  . dinner: *** . snacks: *** . drinks: *** -Current exercise: *** -Educated on {CCM DM COUNSELING:25123} -Counseled to check feet daily and get yearly eye exams -{CCMPHARMDINTERVENTION:25122}  Heart Failure (Goal: manage symptoms and prevent exacerbations) -{US controlled/uncontrolled:25276} -Last ejection fraction: 40-45% (Date: 12/21) -HF type: Diastolic -NYHA Class: II (slight limitation of activity) -AHA HF Stage: C (Heart disease and symptoms present) -Current treatment: . *** -Medications previously tried: ***  -Current home BP/HR readings: *** -Current dietary habits: *** -Current exercise habits: *** -Educated on {CCM HF Counseling:25125} -{CCMPHARMDINTERVENTION:25122}  COPD (Goal: control symptoms and prevent exacerbations) -{US controlled/uncontrolled:25276} -Current treatment  . *** -Medications previously tried: ***  -Gold Grade: {CHL HP Upstream Pharm COPD Gold NVBTY:[phone]} -Current COPD Classification:  {CHL HP Upstream Pharm COPD Classification:[phone]} -MMRC/CAT score: *** -Pulmonary  function testing: *** -Exacerbations requiring treatment in last 6 months: *** -Patient {Actions; denies-reports:120008} consistent use of maintenance inhaler -Frequency of rescue inhaler use: *** -Counseled on {CCMINHALERCOUNSELING:25121} -{CCMPHARMDINTERVENTION:25122}  Depression/Anxiety (Goal: ***) -{US controlled/uncontrolled:25276} -Current treatment: . *** -Medications previously tried/failed: *** -PHQ9: *** -GAD7: *** -Connected with *** for mental health support -Educated on {CCM mental health counseling:25127} -{CCMPHARMDINTERVENTION:25122}   Patient Goals/Self-Care Activities . Patient will:  - {pharmacypatientgoals:24919}  Follow Up Plan: {CM FOLLOW UP FSFS:23953}

## 2020-12-12 NOTE — Telephone Encounter (Signed)
  Chronic Care Management   Note  12/12/2020 Name: LOVELL ROE MRN: [id number] DOB: Nov 27, 1950  I spoke briefly with Mrs. Zigmund Daniel as she was walking into [place] and had forgotten our appointment. She states she is not doing well since her diagnosis of heart failure. I encouraged her to call me directly at her earliest convenience. Will outreach in 7-14 days if I haven't heard from her.   Junita Push. Kenton Kingfisher PharmD, [facility] [phone]

## 2020-12-13 ENCOUNTER — Other Ambulatory Visit: Payer: Self-pay | Admitting: Nurse Practitioner

## 2020-12-13 DIAGNOSIS — R7989 Other specified abnormal findings of blood chemistry: Secondary | ICD-10-CM

## 2020-12-13 DIAGNOSIS — D72821 Monocytosis (symptomatic): Secondary | ICD-10-CM

## 2020-12-13 LAB — CBC WITH DIFFERENTIAL/PLATELET
Basophils Absolute: 0.1 10*3/uL (ref 0.0–0.2)
Basos: 1 %
EOS (ABSOLUTE): 0.5 10*3/uL — ABNORMAL HIGH (ref 0.0–0.4)
Eos: 5 %
Hematocrit: 36.2 % (ref 34.0–46.6)
Hemoglobin: 11.5 g/dL (ref 11.1–15.9)
Immature Grans (Abs): 0.1 10*3/uL (ref 0.0–0.1)
Immature Granulocytes: 1 %
Lymphocytes Absolute: 3 10*3/uL (ref 0.7–3.1)
Lymphs: 27 %
MCH: 28.3 pg (ref 26.6–33.0)
MCHC: 31.8 g/dL (ref 31.5–35.7)
MCV: 89 fL (ref 79–97)
Monocytes Absolute: 1 10*3/uL — ABNORMAL HIGH (ref 0.1–0.9)
Monocytes: 9 %
Neutrophils Absolute: 6.4 10*3/uL (ref 1.4–7.0)
Neutrophils: 57 %
Platelets: 456 10*3/uL — ABNORMAL HIGH (ref 150–450)
RBC: 4.06 x10E6/uL (ref 3.77–5.28)
RDW: 13.2 % (ref 11.7–15.4)
WBC: 11.1 10*3/uL — ABNORMAL HIGH (ref 3.4–10.8)

## 2020-12-13 LAB — ANA W/REFLEX IF POSITIVE: Anti Nuclear Antibody (ANA): NEGATIVE

## 2020-12-13 LAB — C-REACTIVE PROTEIN: CRP: 1 mg/L (ref 0–10)

## 2020-12-13 LAB — SEDIMENTATION RATE: Sed Rate: 11 mm/hr (ref 0–40)

## 2020-12-13 NOTE — Progress Notes (Signed)
Good afternoon, please let Kinzee know her labs have returned.  Inflammatory labs are normal and ANA is negative, great news!!  CBC -- noting continued mild elevation in white blood cell count and platelets -- I do feel a visit with hematology would be beneficial since this has been ongoing since you have been sick. Any recent steroid use?  This could also push white blood cell up. I will place this referral and she should hear from them over the week.  Any questions? Keep being awesome!!  Thank you for allowing me to participate in your care.  I appreciate you. Kindest regards, Leonora Gores

## 2020-12-14 ENCOUNTER — Ambulatory Visit (INDEPENDENT_AMBULATORY_CARE_PROVIDER_SITE_OTHER): Payer: Medicare HMO

## 2020-12-14 VITALS — Ht 58.5 in | Wt 142.0 lb

## 2020-12-14 DIAGNOSIS — Z0000 Encounter for general adult medical examination without abnormal findings: Secondary | ICD-10-CM | POA: Diagnosis not present

## 2020-12-14 NOTE — Patient Instructions (Signed)
Jordan Chan , Thank you for taking time to come for your Medicare Wellness Visit. I appreciate your ongoing commitment to your health goals. Please review the following plan we discussed and let me know if I can assist you in the future.   Screening recommendations/referrals: Colonoscopy: completed 01/01/2017 Mammogram: completed 03/12/2020 Bone Density: completed 02/25/2016 Recommended yearly ophthalmology/optometry visit for glaucoma screening and checkup Recommended yearly dental visit for hygiene and checkup  Vaccinations: Influenza vaccine: completed 07/26/2020, due 03/04/2021 Pneumococcal vaccine: completed 08/05/2016 Tdap vaccine: completed 01/17/2019 Shingles vaccine: discussed   Covid-19: 05/31/2020, 11/10/2019, 10/20/2019  Advanced directives: Please bring a copy of your POA (Power of Attorney) and/or Living Will to your next appointment.   Conditions/risks identified: none  Next appointment: Follow up in one year for your annual wellness visit    Preventive Care 65 Years and Older, Female Preventive care refers to lifestyle choices and visits with your health care provider that can promote health and wellness. What does preventive care include?  A yearly physical exam. This is also called an annual well check.  Dental exams once or twice a year.  Routine eye exams. Ask your health care provider how often you should have your eyes checked.  Personal lifestyle choices, including:  Daily care of your teeth and gums.  Regular physical activity.  Eating a healthy diet.  Avoiding tobacco and drug use.  Limiting alcohol use.  Practicing safe sex.  Taking low-dose aspirin every day.  Taking vitamin and mineral supplements as recommended by your health care provider. What happens during an annual well check? The services and screenings done by your health care provider during your annual well check will depend on your age, overall health, lifestyle risk factors, and family  history of disease. Counseling  Your health care provider may ask you questions about your:  Alcohol use.  Tobacco use.  Drug use.  Emotional well-being.  Home and relationship well-being.  Sexual activity.  Eating habits.  History of falls.  Memory and ability to understand (cognition).  Work and work Statistician.  Reproductive health. Screening  You may have the following tests or measurements:  Height, weight, and BMI.  Blood pressure.  Lipid and cholesterol levels. These may be checked every 5 years, or more frequently if you are over 56 years old.  Skin check.  Lung cancer screening. You may have this screening every year starting at age 57 if you have a 30-pack-year history of smoking and currently smoke or have quit within the past 15 years.  Fecal occult blood test (FOBT) of the stool. You may have this test every year starting at age 22.  Flexible sigmoidoscopy or colonoscopy. You may have a sigmoidoscopy every 5 years or a colonoscopy every 10 years starting at age 72.  Hepatitis C blood test.  Hepatitis B blood test.  Sexually transmitted disease (STD) testing.  Diabetes screening. This is done by checking your blood sugar (glucose) after you have not eaten for a while (fasting). You may have this done every 1-3 years.  Bone density scan. This is done to screen for osteoporosis. You may have this done starting at age 25.  Mammogram. This may be done every 1-2 years. Talk to your health care provider about how often you should have regular mammograms. Talk with your health care provider about your test results, treatment options, and if necessary, the need for more tests. Vaccines  Your health care provider may recommend certain vaccines, such as:  Influenza vaccine. This  is recommended every year.  Tetanus, diphtheria, and acellular pertussis (Tdap, Td) vaccine. You may need a Td booster every 10 years.  Zoster vaccine. You may need this after  age 93.  Pneumococcal 13-valent conjugate (PCV13) vaccine. One dose is recommended after age 79.  Pneumococcal polysaccharide (PPSV23) vaccine. One dose is recommended after age 57. Talk to your health care provider about which screenings and vaccines you need and how often you need them. This information is not intended to replace advice given to you by your health care provider. Make sure you discuss any questions you have with your health care provider. Document Released: 08/17/2015 Document Revised: 04/09/2016 Document Reviewed: 05/22/2015 Elsevier Interactive Patient Education  2017 [employer] Prevention in the Home Falls can cause injuries. They can happen to people of all ages. There are many things you can do to make your home safe and to help prevent falls. What can I do on the outside of my home?  Regularly fix the edges of walkways and driveways and fix any cracks.  Remove anything that might make you trip as you walk through a door, such as a raised step or threshold.  Trim any bushes or trees on the path to your home.  Use bright outdoor lighting.  Clear any walking paths of anything that might make someone trip, such as rocks or tools.  Regularly check to see if handrails are loose or broken. Make sure that both sides of any steps have handrails.  Any raised decks and porches should have guardrails on the edges.  Have any leaves, snow, or ice cleared regularly.  Use sand or salt on walking paths during winter.  Clean up any spills in your garage right away. This includes oil or grease spills. What can I do in the bathroom?  Use night lights.  Install grab bars by the toilet and in the tub and shower. Do not use towel bars as grab bars.  Use non-skid mats or decals in the tub or shower.  If you need to sit down in the shower, use a plastic, non-slip stool.  Keep the floor dry. Clean up any water that spills on the floor as soon as it  happens.  Remove soap buildup in the tub or shower regularly.  Attach bath mats securely with double-sided non-slip rug tape.  Do not have throw rugs and other things on the floor that can make you trip. What can I do in the bedroom?  Use night lights.  Make sure that you have a light by your bed that is easy to reach.  Do not use any sheets or blankets that are too big for your bed. They should not hang down onto the floor.  Have a firm chair that has side arms. You can use this for support while you get dressed.  Do not have throw rugs and other things on the floor that can make you trip. What can I do in the kitchen?  Clean up any spills right away.  Avoid walking on wet floors.  Keep items that you use a lot in easy-to-reach places.  If you need to reach something above you, use a Chan step stool that has a grab bar.  Keep electrical cords out of the way.  Do not use floor polish or wax that makes floors slippery. If you must use wax, use non-skid floor wax.  Do not have throw rugs and other things on the floor that can make you  trip. What can I do with my stairs?  Do not leave any items on the stairs.  Make sure that there are handrails on both sides of the stairs and use them. Fix handrails that are broken or loose. Make sure that handrails are as long as the stairways.  Check any carpeting to make sure that it is firmly attached to the stairs. Fix any carpet that is loose or worn.  Avoid having throw rugs at the top or bottom of the stairs. If you do have throw rugs, attach them to the floor with carpet tape.  Make sure that you have a light switch at the top of the stairs and the bottom of the stairs. If you do not have them, ask someone to add them for you. What else can I do to help prevent falls?  Wear shoes that:  Do not have high heels.  Have rubber bottoms.  Are comfortable and fit you well.  Are closed at the toe. Do not wear sandals.  If you  use a stepladder:  Make sure that it is fully opened. Do not climb a closed stepladder.  Make sure that both sides of the stepladder are locked into place.  Ask someone to hold it for you, if possible.  Clearly mark and make sure that you can see:  Any grab bars or handrails.  First and last steps.  Where the edge of each step is.  Use tools that help you move around (mobility aids) if they are needed. These include:  Canes.  Walkers.  Scooters.  Crutches.  Turn on the lights when you go into a dark area. Replace any light bulbs as soon as they burn out.  Set up your furniture so you have a clear path. Avoid moving your furniture around.  If any of your floors are uneven, fix them.  If there are any pets around you, be aware of where they are.  Review your medicines with your doctor. Some medicines can make you feel dizzy. This can increase your chance of falling. Ask your doctor what other things that you can do to help prevent falls. This information is not intended to replace advice given to you by your health care provider. Make sure you discuss any questions you have with your health care provider. Document Released: 05/17/2009 Document Revised: 12/27/2015 Document Reviewed: 08/25/2014 Elsevier Interactive Patient Education  2017 [employer].

## 2020-12-14 NOTE — Progress Notes (Signed)
I connected with Jordan Chan today by telephone and verified that I am speaking with the correct person using two identifiers. Location patient: home Location provider: work Persons participating in the virtual visit: Jordan Chan, Glenna Durand LPN.   I discussed the limitations, risks, security and privacy concerns of performing an evaluation and management service by telephone and the availability of in person appointments. I also discussed with the patient that there may be a patient responsible charge related to this service. The patient expressed understanding and verbally consented to this telephonic visit.    Interactive audio and video telecommunications were attempted between this provider and patient, however failed, due to patient having technical difficulties OR patient did not have access to video capability.  We continued and completed visit with audio only.     Vital signs may be patient reported or missing.  Subjective:   Jordan Chan is a 70 y.o. female who presents for Medicare Annual (Subsequent) preventive examination.  Review of Systems     Cardiac Risk Factors include: advanced age (>81mn, >>68women);diabetes mellitus;dyslipidemia;hypertension;sedentary lifestyle     Objective:    Today's Vitals   12/14/20 0914 12/14/20 0915  Weight: 142 lb (64.4 kg)   Height: 4' 10.5" (1.486 m)   PainSc:  6    Body mass index is 29.17 kg/m.  Advanced Directives 12/14/2020 12/07/2020 03/31/2020 12/12/2019 12/11/2019 11/17/2019 12/04/2017  Does Patient Have a Medical Advance Directive? Yes No Yes Yes No Yes Yes  Type of [place] - [place] Healthcare Power of Attorney - Living will;Healthcare Power of [place]  Does patient want to make changes to medical advance directive? - - Yes (ED - Information included in AVS) No - Patient declined - - -   Copy of HPaguatein Chart? No - copy requested - No - copy requested - - No - copy requested No - copy requested  Would patient like information on creating a medical advance directive? - - No - Patient declined No - Patient declined No - Patient declined - -    Current Medications (verified) Outpatient Encounter Medications as of 12/14/2020  Medication Sig  . Accu-Chek Softclix Lancets lancets TEST BLOOD SUGAR THREE TIMES DAILY  . albuterol (PROVENTIL HFA;VENTOLIN HFA) 108 (90 BASE) MCG/ACT inhaler Inhale 2 puffs into the lungs every 6 (six) hours as needed for wheezing or shortness of breath.   . Alcohol Swabs (B-D SINGLE USE SWABS REGULAR) PADS USE TWICE DAILY  WITH  SUGAR  CHECKS  . atorvastatin (LIPITOR) 20 MG tablet Take 1 tablet (20 mg total) by mouth daily.  . Blood Glucose Monitoring Suppl (TRUE METRIX METER) w/Device KIT Use to check blood sugar 4 times a day  . cholecalciferol (VITAMIN D3) 25 MCG (1000 UNIT) tablet Take 1,000 Units by mouth daily.  . Cyanocobalamin 1000 MCG/ML KIT Inject 1,000 mcg as directed every 30 (thirty) days.  . cyclobenzaprine (FLEXERIL) 5 MG tablet Take 5 mg by mouth 2 (two) times daily as needed for muscle spasms. Takes very rarely  . diclofenac (VOLTAREN) 50 MG EC tablet TAKE 1 TABLET THREE TIMES DAILY  . Dulaglutide (TRULICITY) 4.5 MCX/4.4YJSOPN Inject 4.5 mg as directed once a week.  . furosemide (LASIX) 40 MG tablet Take 1 tablet (40 mg total) by mouth daily.  .Marland Kitchengabapentin (NEURONTIN) 800 MG tablet Take 1 tablet (800 mg total) by mouth 3 (three) times daily.  .[name]  glucose blood (TRUE METRIX BLOOD GLUCOSE TEST) test strip To check blood sugar 3 times a day.  . lisinopril (ZESTRIL) 2.5 MG tablet Take 1 tablet (2.5 mg total) by mouth daily.  . metFORMIN (GLUCOPHAGE) 1000 MG tablet Take 1 tablet (1,000 mg total) by mouth 2 (two) times daily with a meal.  . metoprolol succinate (TOPROL-XL) 25 MG 24 hr tablet Take 0.5 tablets (12.5 mg total)  by mouth daily.  . montelukast (SINGULAIR) 10 MG tablet Take 10 mg by mouth at bedtime.  [name] PAIN MANAGEMENT INTRATHECAL, IT, PUMP 1 each by Intrathecal Chan. Intrathecal (IT) medication:  Morphine Patient does not remember current. Adjusted every 2.5 months at [facility] in [place].  . TRELEGY ELLIPTA 100-62.5-25 MCG/INH AEPB INHALE 1 PUFF INTO THE LUNGS DAILY.  [name] TRESIBA FLEXTOUCH 100 UNIT/ML FlexTouch Pen INJECT 30 UNITS INTO THE SKIN AT BEDTIME. (Patient taking differently: Inject 40 Units into the skin at bedtime.)  . venlafaxine XR (EFFEXOR-XR) 150 MG 24 hr capsule Take 1 capsule (150 mg total) by mouth daily.  [name] lactulose (CHRONULAC) 10 GM/15ML solution Take 45 mLs (30 g total) by mouth daily as needed for mild constipation or severe constipation. (Patient not taking: No sig reported)  . lisinopril (ZESTRIL) 5 MG tablet Take 5 mg by mouth daily. (Patient not taking: Reported on 12/14/2020)   No facility-administered encounter medications on file as of 12/14/2020.    Allergies (verified) Other, Pain patch [menthol], Fentanyl, Avelox [moxifloxacin hcl in nacl], Doxycycline, Erythromycin, Moxifloxacin hcl, Oxycontin [oxycodone], and Ozempic [semaglutide]   History: Past Medical History:  Diagnosis Date  . Arthritis   . Asthma   . Breast cancer ([place]) 1998   right breast ca with mastectomy and chemotherapy and radiation  . Bronchitis   . CHF (congestive heart failure) ([place])    "with Morphine withdrawal"  . COPD (chronic obstructive pulmonary disease) ([place])   . Diabetes mellitus without complication ([place])   . Diverticulitis    diverticulosis also  . Dyspnea   . Endometriosis   . GERD (gastroesophageal reflux disease)   . History of shingles 2000-2005  . Hypercholesteremia   . Hypertension   . IBS (irritable bowel syndrome)   . Low back pain    a. Implanted morphine/bupivicaine/clonidine pump.  [name] Neuropathy   . Orthopnea   . Oxygen dependent    uses at night   . Personal history of chemotherapy   . Personal history of radiation therapy   . Pneumonia    pneumonia 5-6 times, history of bronchitis also  . Scoliosis   . Sleep apnea    does not use cpap  . Stroke ([place]) 2010   TIA, 10 years ago  . Withdrawal from sedative drug ([place])    withdrawal from morphine when pump batteries died   Past Surgical History:  Procedure Laterality Date  . ABDOMINAL HYSTERECTOMY  1987  . BACK SURGERY     Tailbone removed following fracture  . BREAST SURGERY Right    mastectomy  . CARDIAC CATHETERIZATION    . CATARACT EXTRACTION W/PHACO Right 05/19/2019   Procedure: CATARACT EXTRACTION PHACO AND INTRAOCULAR LENS PLACEMENT ([place]), RIGHT, DIABETIC;  Surgeon: Marchia Meiers, MD;  Location: [facility] ORS;  Service: Ophthalmology;  Laterality: Right;  Lot # [id number] H [place]: 00:43.8 CDE: 4.59  . CATARACT EXTRACTION W/PHACO Left 06/16/2019   Procedure: CATARACT EXTRACTION PHACO AND INTRAOCULAR LENS PLACEMENT ([place]) LEFT VISION BLUE DIABETIC;  Surgeon: Marchia Meiers, MD;  Location: [facility] ORS;  Service: Ophthalmology;  Laterality: Left;  Lot #[id number] [place]: 00:46.9 CDE: 6.53  . COCCYX REMOVAL    . COLONOSCOPY WITH PROPOFOL N/A 01/01/2017   Procedure: COLONOSCOPY WITH PROPOFOL;  Surgeon: Jonathon Bellows, MD;  Location: [facility] ENDOSCOPY;  Service: Endoscopy;  Laterality: N/A;  . ELBOW ARTHROSCOPY WITH TENDON RECONSTRUCTION    . ESOPHAGOGASTRODUODENOSCOPY (EGD) WITH PROPOFOL N/A 01/01/2017   Procedure: ESOPHAGOGASTRODUODENOSCOPY (EGD) WITH PROPOFOL;  Surgeon: Jonathon Bellows, MD;  Location: [facility] ENDOSCOPY;  Service: Endoscopy;  Laterality: N/A;  . INTRATHECAL PUMP IMPLANT    . LEFT HEART CATH AND CORONARY ANGIOGRAPHY N/A 04/28/2017   Procedure: LEFT HEART CATH AND CORONARY ANGIOGRAPHY;  Surgeon: [name], MD;  Location: [facility] CV LAB;  Service: Cardiovascular;  Laterality: N/A;  . MASTECTOMY Right 06/1997  . morphine pump  2011  . PLANTAR FASCIA RELEASE    . TRIGGER FINGER  RELEASE     Family History  Problem Relation Age of Onset  . Cancer Mother 71       lung  . Thyroid disease Mother   . Cancer Father 73       Pancreatic  . Hypertension Sister   . Cancer Sister        "cancer on face"  . Hyperlipidemia Sister   . Osteoporosis Maternal Grandmother   . Hyperlipidemia Son   . Seizures Son   . Cancer Paternal Grandmother        colon  . Arthritis Paternal Grandfather   . Breast cancer Neg Hx    Social History   Socioeconomic History  . Marital status: Unknown    Spouse name: Not on file  . Number of children: Not on file  . Years of education: Not on file  . Highest education level: High school graduate  Occupational History  . Occupation: retired  Tobacco Use  . Smoking status: Former Smoker    Packs/day: 1.00    Years: 30.00    Pack years: 30.00    Types: Cigarettes    Quit date: 04/30/2004    Years since quitting: 16.6  . Smokeless tobacco: Never Used  Vaping Use  . Vaping Use: Never used  Substance and Sexual Activity  . Alcohol use: No  . Drug use: No  . Sexual activity: Not Currently    Birth control/protection: Post-menopausal  Other Topics Concern  . Not on file  Social History Narrative   ** Merged History Encounter **       Social Determinants of Health   Financial Resource Strain: Low Risk   . Difficulty of Paying Living Expenses: Not hard at all  Food Insecurity: No Food Insecurity  . Worried About Charity fundraiser in the Last Year: Never true  . Ran Out of Food in the Last Year: Never true  Transportation Needs: No Transportation Needs  . Lack of Transportation (Medical): No  . Lack of Transportation (Non-Medical): No  Physical Activity: Inactive  . Days of Exercise per Week: 0 days  . Minutes of Exercise per Session: 0 min  Stress: No Stress Concern Present  . Feeling of Stress : Not at all  Social Connections: Not on file    Tobacco Counseling Counseling given: Not Answered   Clinical  Intake:  Pre-visit preparation completed: Yes  Pain : 0-10 Pain Score: 6  Pain Type: Chronic pain Pain Location: Generalized Pain Descriptors / Indicators: Aching,Throbbing Pain Onset: More than a month ago Pain Frequency: Constant     Nutritional Status: BMI 25 -29 Overweight Nutritional Risks: Nausea/  vomitting/ diarrhea (nausea for past 3 months) Diabetes: Yes  How often do you need to have someone help you when you read instructions, pamphlets, or other written materials from your doctor or pharmacy?: 1 - Never What is the last grade level you completed in school?: 12th grade  Diabetic? Yes Nutrition Risk Assessment:  Has the patient had any N/V/D within the last 2 months?  Yes  Does the patient have any non-healing wounds?  Yes  Has the patient had any unintentional weight loss or weight gain?  No   Diabetes:  Is the patient diabetic?  Yes  If diabetic, was a CBG obtained today?  No  Did the patient bring in their glucometer from home?  No  How often do you monitor your CBG's? 3-4 daily.   Financial Strains and Diabetes Management:  Are you having any financial strains with the device, your supplies or your medication? No .  Does the patient want to be seen by Chronic Care Management for management of their diabetes?  No  Would the patient like to be referred to a Nutritionist or for Diabetic Management?  No   Diabetic Exams:  Diabetic Eye Exam: Completed 03/14/2020 Diabetic Foot Exam: Completed 10/25/2020   Interpreter Needed?: No  Information entered by :: NAllen LPN   Activities of Daily Living In your present state of health, do you have any difficulty performing the following activities: 12/14/2020  Hearing? N  Vision? N  Difficulty concentrating or making decisions? N  Walking or climbing stairs? Y  Comment SOB  Dressing or bathing? N  Doing errands, shopping? N  Preparing Food and eating ? N  Using the Toilet? N  In the past six months, have you  accidently leaked urine? N  Do you have problems with loss of bowel control? N  Managing your Medications? N  Managing your Finances? N  Housekeeping or managing your Housekeeping? N  Some recent data might be hidden    Patient Care Team: [name], NP as PCP - General (Nurse Practitioner) End, Harrell Gave, MD as PCP - Cardiology (Cardiology) Erby Pian, MD as Referring Physician (Pulmonary Disease) Kandace Blitz, MD as Referring Physician (Pain Medicine) Greg Cutter, LCSW as Social Worker (Licensed Clinical Social Worker) Vladimir Faster, [facility] as Pharmacist (Pharmacist)  Indicate any recent Medical Services you may have received from other than [facility] providers in the past year (date may be approximate).     Assessment:   This is a routine wellness examination for Jordan Chan.  Hearing/Vision screen  Hearing Screening   125Hz  250Hz  500Hz  1000Hz  2000Hz  3000Hz  4000Hz  6000Hz  8000Hz   Right ear:           Left ear:           Vision Screening Comments: Regular eye exams, [facility]  Dietary issues and exercise activities discussed: Current Exercise Habits: The patient does not participate in regular exercise at present  Goals Addressed            This Visit's Progress   . Patient Stated       12/14/2020, no goals      Depression Screen PHQ 2/9 Scores 12/14/2020 10/25/2020 07/26/2020 03/28/2020 12/23/2019 11/15/2019 08/01/2019  PHQ - 2 Score 2 0 0 0 0 3 1  PHQ- 9 Score 11 6 0 8 9 9 7     Fall Risk Fall Risk  12/14/2020 11/17/2019 08/01/2019 01/17/2019 12/06/2018  Falls in the past year? 0 1 0 0 0  Number falls in past yr: - 0 0 0 -  Injury with Fall? - 0 0 - -  Risk for fall due to : Medication side effect - - - -  Follow up Falls evaluation completed;Education provided;Falls prevention discussed - - Falls evaluation completed -    FALL RISK PREVENTION PERTAINING TO THE HOME:  Any stairs in or around the home? No  If so, are there any without  handrails? n/a Home free of loose throw rugs in walkways, pet beds, electrical cords, etc? Yes  Adequate lighting in your home to reduce risk of falls? Yes   ASSISTIVE DEVICES UTILIZED TO PREVENT FALLS:  Life alert? No  Use of a cane, walker or w/c? No  Grab bars in the bathroom? No  Shower chair or bench in shower? No  Elevated toilet seat or a handicapped toilet? No   TIMED UP AND GO:  Was the test performed? No .   Cognitive Function:     6CIT Screen 12/14/2020 12/06/2018 12/04/2017  What Year? 0 points 0 points 0 points  What month? 0 points 0 points 0 points  What time? 0 points 0 points 0 points  Count back from 20 0 points 0 points 0 points  Months in reverse 0 points 0 points 0 points  Repeat phrase 0 points 0 points 0 points  Total Score 0 0 0    Immunizations Immunization History  Administered Date(s) Administered  . Fluad Quad(high Dose 65+) 04/19/2019, 07/26/2020  . Influenza, High Dose Seasonal PF 04/28/2016, 04/13/2017, 04/14/2018  . Influenza,inj,Quad PF,6+ Mos 06/18/2015  . Influenza-Unspecified 04/04/2014  . PFIZER(Purple Top)SARS-COV-2 Vaccination 10/20/2019, 11/10/2019, 05/31/2020  . Pneumococcal Conjugate-13 06/18/2015  . Pneumococcal Polysaccharide-23 08/06/2005, 08/05/2016  . Td 08/28/2007, 01/17/2019    TDAP status: Up to date  Flu Vaccine status: Up to date  Pneumococcal vaccine status: Up to date  Covid-19 vaccine status: Completed vaccines  Qualifies for Shingles Vaccine? Yes   Zostavax completed No   Shingrix Completed?: No.    Education has been provided regarding the importance of this vaccine. Patient has been advised to call insurance company to determine out of pocket expense if they have not yet received this vaccine. Advised may also receive vaccine at local pharmacy or Health Dept. Verbalized acceptance and understanding.  Screening Tests Health Maintenance  Topic Date Due  . INFLUENZA VACCINE  03/04/2021  . OPHTHALMOLOGY EXAM   03/14/2021  . HEMOGLOBIN A1C  04/27/2021  . FOOT EXAM  10/25/2021  . MAMMOGRAM  03/12/2022  . COLONOSCOPY (Pts 45-81yr Insurance coverage will need to be confirmed)  01/02/2027  . TETANUS/TDAP  01/16/2029  . DEXA SCAN  Completed  . COVID-19 Vaccine  Completed  . Hepatitis C Screening  Completed  . PNA vac Low Risk Adult  Completed  . HPV VACCINES  Aged Out    Health Maintenance  There are no preventive care reminders to display for this patient.  Colorectal cancer screening: Type of screening: Colonoscopy. Completed 01/01/2017. Repeat every 10 years  Mammogram status: Completed 03/12/2020. Repeat every year  Bone Density status: Completed 02/25/2016.   Lung Cancer Screening: (Low Dose CT Chest recommended if Age 10538-80years, 30 pack-year currently smoking OR have quit w/in 15years.) does not qualify.   Lung Cancer Screening Referral: no  Additional Screening:  Hepatitis C Screening: does qualify; Completed 06/18/2015  Vision Screening: Recommended annual ophthalmology exams for early detection of glaucoma and other disorders of the eye. Is the patient up to date with  their annual eye exam?  Yes  Who is the provider or what is the name of the office in which the patient attends annual eye exams? [facility] If pt is not established with a provider, would they like to be referred to a provider to establish care? No .   Dental Screening: Recommended annual dental exams for proper oral hygiene  Community Resource Referral / Chronic Care Management: CRR required this visit?  No   CCM required this visit?  No      Plan:     I have personally reviewed and noted the following in the patient's chart:   . Medical and social history . Use of alcohol, tobacco or illicit drugs  . Current medications and supplements including opioid prescriptions.  . Functional ability and status . Nutritional status . Physical activity . Advanced directives . List of other  physicians . Hospitalizations, surgeries, and ER visits in previous 12 months . Vitals . Screenings to include cognitive, depression, and falls . Referrals and appointments  In addition, I have reviewed and discussed with patient certain preventive protocols, quality metrics, and best practice recommendations. A written personalized care plan for preventive services as well as general preventive health recommendations were provided to patient.     Kellie Simmering, LPN   09/12/9066   Nurse Notes:

## 2020-12-21 ENCOUNTER — Ambulatory Visit: Payer: Self-pay | Admitting: Pharmacist

## 2020-12-21 ENCOUNTER — Inpatient Hospital Stay: Payer: Medicare HMO

## 2020-12-21 ENCOUNTER — Inpatient Hospital Stay: Payer: Medicare HMO | Attending: Oncology | Admitting: Oncology

## 2020-12-21 ENCOUNTER — Other Ambulatory Visit: Payer: Self-pay

## 2020-12-21 VITALS — BP 116/61 | HR 104 | Temp 98.6°F | Wt 159.1 lb

## 2020-12-21 DIAGNOSIS — I700 Atherosclerosis of aorta: Secondary | ICD-10-CM | POA: Diagnosis not present

## 2020-12-21 DIAGNOSIS — Z820 Family history of epilepsy and other diseases of the nervous system: Secondary | ICD-10-CM | POA: Diagnosis not present

## 2020-12-21 DIAGNOSIS — I509 Heart failure, unspecified: Secondary | ICD-10-CM | POA: Diagnosis not present

## 2020-12-21 DIAGNOSIS — Z8261 Family history of arthritis: Secondary | ICD-10-CM | POA: Insufficient documentation

## 2020-12-21 DIAGNOSIS — E538 Deficiency of other specified B group vitamins: Secondary | ICD-10-CM | POA: Diagnosis not present

## 2020-12-21 DIAGNOSIS — Z8349 Family history of other endocrine, nutritional and metabolic diseases: Secondary | ICD-10-CM | POA: Diagnosis not present

## 2020-12-21 DIAGNOSIS — I10 Essential (primary) hypertension: Secondary | ICD-10-CM | POA: Diagnosis not present

## 2020-12-21 DIAGNOSIS — Z9221 Personal history of antineoplastic chemotherapy: Secondary | ICD-10-CM | POA: Diagnosis not present

## 2020-12-21 DIAGNOSIS — K219 Gastro-esophageal reflux disease without esophagitis: Secondary | ICD-10-CM | POA: Insufficient documentation

## 2020-12-21 DIAGNOSIS — R5382 Chronic fatigue, unspecified: Secondary | ICD-10-CM | POA: Diagnosis not present

## 2020-12-21 DIAGNOSIS — Z800 Family history of malignant neoplasm of digestive organs: Secondary | ICD-10-CM | POA: Diagnosis not present

## 2020-12-21 DIAGNOSIS — D649 Anemia, unspecified: Secondary | ICD-10-CM | POA: Diagnosis not present

## 2020-12-21 DIAGNOSIS — D75839 Thrombocytosis, unspecified: Secondary | ICD-10-CM | POA: Insufficient documentation

## 2020-12-21 DIAGNOSIS — Z923 Personal history of irradiation: Secondary | ICD-10-CM | POA: Diagnosis not present

## 2020-12-21 DIAGNOSIS — D72821 Monocytosis (symptomatic): Secondary | ICD-10-CM | POA: Diagnosis present

## 2020-12-21 DIAGNOSIS — Z87891 Personal history of nicotine dependence: Secondary | ICD-10-CM | POA: Insufficient documentation

## 2020-12-21 DIAGNOSIS — J449 Chronic obstructive pulmonary disease, unspecified: Secondary | ICD-10-CM | POA: Diagnosis not present

## 2020-12-21 DIAGNOSIS — Z79899 Other long term (current) drug therapy: Secondary | ICD-10-CM | POA: Insufficient documentation

## 2020-12-21 DIAGNOSIS — E119 Type 2 diabetes mellitus without complications: Secondary | ICD-10-CM | POA: Diagnosis not present

## 2020-12-21 DIAGNOSIS — Z8249 Family history of ischemic heart disease and other diseases of the circulatory system: Secondary | ICD-10-CM | POA: Insufficient documentation

## 2020-12-21 DIAGNOSIS — Z808 Family history of malignant neoplasm of other organs or systems: Secondary | ICD-10-CM | POA: Diagnosis not present

## 2020-12-21 DIAGNOSIS — Z8269 Family history of other diseases of the musculoskeletal system and connective tissue: Secondary | ICD-10-CM | POA: Insufficient documentation

## 2020-12-21 DIAGNOSIS — Z801 Family history of malignant neoplasm of trachea, bronchus and lung: Secondary | ICD-10-CM | POA: Diagnosis not present

## 2020-12-21 DIAGNOSIS — Z86711 Personal history of pulmonary embolism: Secondary | ICD-10-CM | POA: Diagnosis not present

## 2020-12-21 DIAGNOSIS — R0602 Shortness of breath: Secondary | ICD-10-CM | POA: Insufficient documentation

## 2020-12-21 LAB — IRON AND TIBC
Iron: 40 ug/dL (ref 28–170)
Saturation Ratios: 8 % — ABNORMAL LOW (ref 10.4–31.8)
TIBC: 475 ug/dL — ABNORMAL HIGH (ref 250–450)
UIBC: 435 ug/dL

## 2020-12-21 LAB — CBC WITH DIFFERENTIAL/PLATELET
Abs Immature Granulocytes: 0.06 10*3/uL (ref 0.00–0.07)
Basophils Absolute: 0.1 10*3/uL (ref 0.0–0.1)
Basophils Relative: 1 %
Eosinophils Absolute: 0.5 10*3/uL (ref 0.0–0.5)
Eosinophils Relative: 4 %
HCT: 37.2 % (ref 36.0–46.0)
Hemoglobin: 11.7 g/dL — ABNORMAL LOW (ref 12.0–15.0)
Immature Granulocytes: 1 %
Lymphocytes Relative: 25 %
Lymphs Abs: 3.2 10*3/uL (ref 0.7–4.0)
MCH: 28 pg (ref 26.0–34.0)
MCHC: 31.5 g/dL (ref 30.0–36.0)
MCV: 89 fL (ref 80.0–100.0)
Monocytes Absolute: 1.1 10*3/uL — ABNORMAL HIGH (ref 0.1–1.0)
Monocytes Relative: 8 %
Neutro Abs: 8 10*3/uL — ABNORMAL HIGH (ref 1.7–7.7)
Neutrophils Relative %: 61 %
Platelets: 446 10*3/uL — ABNORMAL HIGH (ref 150–400)
RBC: 4.18 MIL/uL (ref 3.87–5.11)
RDW: 14.1 % (ref 11.5–15.5)
WBC: 13 10*3/uL — ABNORMAL HIGH (ref 4.0–10.5)
nRBC: 0 % (ref 0.0–0.2)

## 2020-12-21 LAB — RETICULOCYTES
Immature Retic Fract: 14.1 % (ref 2.3–15.9)
RBC.: 4.19 MIL/uL (ref 3.87–5.11)
Retic Count, Absolute: 62 10*3/uL (ref 19.0–186.0)
Retic Ct Pct: 1.5 % (ref 0.4–3.1)

## 2020-12-21 LAB — PATHOLOGIST SMEAR REVIEW

## 2020-12-21 LAB — FOLATE: Folate: 5.1 ng/mL — ABNORMAL LOW (ref >5.9)

## 2020-12-21 LAB — FERRITIN: Ferritin: 5 ng/mL — ABNORMAL LOW (ref 11–307)

## 2020-12-21 NOTE — Progress Notes (Signed)
Hematology/Oncology Consult note [facility] [phone] [phone]  Patient Care Team: [name], NP as PCP - General (Nurse Practitioner) End, Harrell Gave, MD as PCP - Cardiology (Cardiology) Erby Pian, MD as Referring Physician (Pulmonary Disease) Kandace Blitz, MD as Referring Physician (Pain Medicine) Greg Cutter, LCSW as Social Worker (Licensed Clinical Social Worker) Vladimir Faster, [facility] as Pharmacist (Pharmacist)   Name of the patient: Jordan Chan  [id number]  [birth year]    Reason for referral-monocytosis/thrombocytosis   Referring physician-Jolene Deri Fuelling, NP  Date of visit: 12/21/20   History of presenting illness-patient is a 70 year old female with a past medical history significant for COPD GERD insulin-dependent diabetes history of breast cancer in the remote past who has been referred to [place] for leukocytosis and thrombocytosis.Looking back at her CBC her white cell count fluctuates between 10-16 over the last 7 years.  More recently since the last 3 months her white count has been between 11-13.  There are times when her white count has gone up to 34 and patient thinks that she may have been on steroids.  Differential has mainly showed neutrophilia but also with some monocytosis and mild eosinophilia.  Patient also noted to have a platelet count mainly in the high 400s at least since 2015 with no clear rising trend.  Hemoglobin more recently has been close to 11  Patient reports chronic fatigue as well as exertional shortness of breath secondary to her heart failure.  She has a pain pump for her back pain  ECOG PS- 1  Pain scale- 4   Review of systems- Review of Systems  Constitutional: Positive for malaise/fatigue. Negative for chills, fever and weight loss.  HENT: Negative for congestion, ear discharge and nosebleeds.   Eyes: Negative for blurred vision.  Respiratory: Positive for shortness  of breath. Negative for cough, hemoptysis, sputum production and wheezing.   Cardiovascular: Negative for chest pain, palpitations, orthopnea and claudication.  Gastrointestinal: Negative for abdominal pain, blood in stool, constipation, diarrhea, heartburn, melena, nausea and vomiting.  Genitourinary: Negative for dysuria, flank pain, frequency, hematuria and urgency.  Musculoskeletal: Negative for back pain, joint pain and myalgias.  Skin: Negative for rash.  Neurological: Negative for dizziness, tingling, focal weakness, seizures, weakness and headaches.  Endo/Heme/Allergies: Does not bruise/bleed easily.  Psychiatric/Behavioral: Negative for depression and suicidal ideas. The patient does not have insomnia.     Allergies  Allergen Reactions  . Other Palpitations    IV steroids  . Pain Patch [Menthol] Anaphylaxis  . Fentanyl   . Avelox [Moxifloxacin Hcl In Nacl] Other (See Comments)    Upset stomach  . Doxycycline Diarrhea  . Erythromycin Nausea Only and Other (See Comments)    Can take a Z-Pak just fine Other reaction(s): Other (See Comments) Can take Z-Pak  Can take a Z-Pak just fine  . Moxifloxacin Hcl Other (See Comments)    Upset stomach Upset stomach  . Oxycontin [Oxycodone] Hives  . Ozempic [Semaglutide] Nausea Only    Patient Active Problem List   Diagnosis Date Noted  . Type 2 diabetes, controlled, with neuropathy ([place]) 07/20/2020  . Insulin dependent type 2 diabetes mellitus ([place]) 07/20/2020  . SOB (shortness of breath) 07/04/2020  . History of pulmonary embolus (PE) 07/04/2020  . Aortic atherosclerosis ([place]) 07/01/2020  . SVT (supraventricular tachycardia) ([place])   . Vitamin D deficiency 07/17/2019  . Stress-induced cardiomyopathy 07/23/2017  . History of non-ST elevation myocardial infarction (NSTEMI)   . Presence of intrathecal pump  04/27/2017  . Advanced care planning/counseling discussion 11/14/2016  . Cervical scoliosis 05/20/2016  . Hypertension  associated with diabetes ([place]) 06/18/2015  . B12 deficiency 03/19/2015  . Sleep apnea 11/16/2014  . Allergic rhinitis 11/16/2014  . History of breast cancer 11/16/2014  . GERD (gastroesophageal reflux disease) 11/16/2014  . COPD, moderate ([place]) 11/16/2014  . Obesity 11/16/2014  . Depression, major, single episode, in partial remission ([place]) 11/16/2014  . Insomnia 11/16/2014  . Hyperlipidemia associated with type 2 diabetes mellitus ([place]) 11/16/2014  . History of sarcoma 09/27/2013  . Chronic pain syndrome 09/30/2012     Past Medical History:  Diagnosis Date  . Arthritis   . Asthma   . Breast cancer ([place]) 1998   right breast ca with mastectomy and chemotherapy and radiation  . Bronchitis   . CHF (congestive heart failure) ([place])    "with Morphine withdrawal"  . COPD (chronic obstructive pulmonary disease) ([place])   . Diabetes mellitus without complication ([place])   . Diverticulitis    diverticulosis also  . Dyspnea   . Endometriosis   . GERD (gastroesophageal reflux disease)   . History of shingles 2000-2005  . Hypercholesteremia   . Hypertension   . IBS (irritable bowel syndrome)   . Low back pain    a. Implanted morphine/bupivicaine/clonidine pump.  [name] Neuropathy   . Orthopnea   . Oxygen dependent    uses at night  . Personal history of chemotherapy   . Personal history of radiation therapy   . Pneumonia    pneumonia 5-6 times, history of bronchitis also  . Scoliosis   . Sleep apnea    does not use cpap  . Stroke ([place]) 2010   TIA, 10 years ago  . Withdrawal from sedative drug ([place])    withdrawal from morphine when pump batteries died     Past Surgical History:  Procedure Laterality Date  . ABDOMINAL HYSTERECTOMY  1987  . BACK SURGERY     Tailbone removed following fracture  . BREAST SURGERY Right    mastectomy  . CARDIAC CATHETERIZATION    . CATARACT EXTRACTION W/PHACO Right 05/19/2019   Procedure: CATARACT EXTRACTION PHACO AND INTRAOCULAR LENS PLACEMENT  ([place]), RIGHT, DIABETIC;  Surgeon: Marchia Meiers, MD;  Location: [facility] ORS;  Service: Ophthalmology;  Laterality: Right;  Lot # [id number] H [place]: 00:43.8 CDE: 4.59  . CATARACT EXTRACTION W/PHACO Left 06/16/2019   Procedure: CATARACT EXTRACTION PHACO AND INTRAOCULAR LENS PLACEMENT ([place]) LEFT VISION BLUE DIABETIC;  Surgeon: Marchia Meiers, MD;  Location: [facility] ORS;  Service: Ophthalmology;  Laterality: Left;  Lot #[id number] H [place]: 00:46.9 CDE: 6.53  . COCCYX REMOVAL    . COLONOSCOPY WITH PROPOFOL N/A 01/01/2017   Procedure: COLONOSCOPY WITH PROPOFOL;  Surgeon: Jonathon Bellows, MD;  Location: [facility] ENDOSCOPY;  Service: Endoscopy;  Laterality: N/A;  . ELBOW ARTHROSCOPY WITH TENDON RECONSTRUCTION    . ESOPHAGOGASTRODUODENOSCOPY (EGD) WITH PROPOFOL N/A 01/01/2017   Procedure: ESOPHAGOGASTRODUODENOSCOPY (EGD) WITH PROPOFOL;  Surgeon: Jonathon Bellows, MD;  Location: [facility] ENDOSCOPY;  Service: Endoscopy;  Laterality: N/A;  . INTRATHECAL PUMP IMPLANT    . LEFT HEART CATH AND CORONARY ANGIOGRAPHY N/A 04/28/2017   Procedure: LEFT HEART CATH AND CORONARY ANGIOGRAPHY;  Surgeon: [name], MD;  Location: [facility] LAB;  Service: Cardiovascular;  Laterality: N/A;  . MASTECTOMY Right 06/1997  . morphine pump  2011  . PLANTAR FASCIA RELEASE    . TRIGGER FINGER RELEASE      Social History   Socioeconomic History  . Marital status: Unknown  Spouse name: Not on file  . Number of children: Not on file  . Years of education: Not on file  . Highest education level: High school graduate  Occupational History  . Occupation: retired  Tobacco Use  . Smoking status: Former Smoker    Packs/day: 1.00    Years: 30.00    Pack years: 30.00    Types: Cigarettes    Quit date: 04/30/2004    Years since quitting: 16.6  . Smokeless tobacco: Never Used  Vaping Use  . Vaping Use: Never used  Substance and Sexual Activity  . Alcohol use: No  . Drug use: No  . Sexual activity: Not Currently    Birth control/protection:  Post-menopausal  Other Topics Concern  . Not on file  Social History Narrative   ** Merged History Encounter **       Social Determinants of Health   Financial Resource Strain: Low Risk   . Difficulty of Paying Living Expenses: Not hard at all  Food Insecurity: No Food Insecurity  . Worried About Charity fundraiser in the Last Year: Never true  . Ran Out of Food in the Last Year: Never true  Transportation Needs: No Transportation Needs  . Lack of Transportation (Medical): No  . Lack of Transportation (Non-Medical): No  Physical Activity: Inactive  . Days of Exercise per Week: 0 days  . Minutes of Exercise per Session: 0 min  Stress: No Stress Concern Present  . Feeling of Stress : Not at all  Social Connections: Not on file  Intimate Partner Violence: Not on file     Family History  Problem Relation Age of Onset  . Cancer Mother 67       lung  . Thyroid disease Mother   . Cancer Father 87       Pancreatic  . Hypertension Sister   . Cancer Sister        "cancer on face"  . Hyperlipidemia Sister   . Osteoporosis Maternal Grandmother   . Hyperlipidemia Son   . Seizures Son   . Cancer Paternal Grandmother        colon  . Arthritis Paternal Grandfather   . Breast cancer Neg Hx      Current Outpatient Medications:  .  Accu-Chek Softclix Lancets lancets, TEST BLOOD SUGAR THREE TIMES DAILY, Disp: 300 each, Rfl: 1 .  albuterol (PROVENTIL HFA;VENTOLIN HFA) 108 (90 BASE) MCG/ACT inhaler, Inhale 2 puffs into the lungs every 6 (six) hours as needed for wheezing or shortness of breath. , Disp: , Rfl:  .  Alcohol Swabs (B-D SINGLE USE SWABS REGULAR) PADS, USE TWICE DAILY  WITH  SUGAR  CHECKS, Disp: 200 each, Rfl: 2 .  atorvastatin (LIPITOR) 20 MG tablet, Take 1 tablet (20 mg total) by mouth daily., Disp: 90 tablet, Rfl: 4 .  Blood Glucose Monitoring Suppl (TRUE METRIX METER) w/Device KIT, Use to check blood sugar 4 times a day, Disp: 1 kit, Rfl: 4 .  cholecalciferol (VITAMIN  D3) 25 MCG (1000 UNIT) tablet, Take 1,000 Units by mouth daily., Disp: , Rfl:  .  Cyanocobalamin 1000 MCG/ML KIT, Inject 1,000 mcg as directed every 30 (thirty) days., Disp: , Rfl:  .  cyclobenzaprine (FLEXERIL) 5 MG tablet, Take 5 mg by mouth 2 (two) times daily as needed for muscle spasms. Takes very rarely, Disp: , Rfl:  .  diclofenac (VOLTAREN) 50 MG EC tablet, TAKE 1 TABLET THREE TIMES DAILY, Disp: 270 tablet, Rfl: 0 .  Dulaglutide (  TRULICITY) 4.5 BJ/4.7WG SOPN, Inject 4.5 mg as directed once a week., Disp: 6 mL, Rfl: 4 .  furosemide (LASIX) 40 MG tablet, Take 1 tablet (40 mg total) by mouth daily., Disp: 90 tablet, Rfl: 0 .  gabapentin (NEURONTIN) 800 MG tablet, Take 1 tablet (800 mg total) by mouth 3 (three) times daily., Disp: 270 tablet, Rfl: 4 .  glucose blood (TRUE METRIX BLOOD GLUCOSE TEST) test strip, To check blood sugar 3 times a day., Disp: 300 each, Rfl: 4 .  lisinopril (ZESTRIL) 2.5 MG tablet, Take 1 tablet (2.5 mg total) by mouth daily., Disp: 90 tablet, Rfl: 1 .  metFORMIN (GLUCOPHAGE) 1000 MG tablet, Take 1 tablet (1,000 mg total) by mouth 2 (two) times daily with a meal., Disp: 180 tablet, Rfl: 4 .  metoprolol succinate (TOPROL-XL) 25 MG 24 hr tablet, Take 0.5 tablets (12.5 mg total) by mouth daily., Disp: 45 tablet, Rfl: 4 .  montelukast (SINGULAIR) 10 MG tablet, Take 10 mg by mouth at bedtime., Disp: , Rfl:  .  PAIN MANAGEMENT INTRATHECAL, IT, PUMP, 1 each by Intrathecal route. Intrathecal (IT) medication:  Morphine Patient does not remember current. Adjusted every 2.5 months at [facility] in [place]., Disp: , Rfl:  .  TRELEGY ELLIPTA 100-62.5-25 MCG/INH AEPB, INHALE 1 PUFF INTO THE LUNGS DAILY., Disp: 180 each, Rfl: 4 .  TRESIBA FLEXTOUCH 100 UNIT/ML FlexTouch Pen, INJECT 30 UNITS INTO THE SKIN AT BEDTIME. (Patient taking differently: Inject 40 Units into the skin at bedtime.), Disp: 30 mL, Rfl: 4 .  venlafaxine XR (EFFEXOR-XR) 150 MG 24 hr capsule, Take 1  capsule (150 mg total) by mouth daily., Disp: 90 capsule, Rfl: 4 .  lactulose (CHRONULAC) 10 GM/15ML solution, Take 45 mLs (30 g total) by mouth daily as needed for mild constipation or severe constipation. (Patient not taking: No sig reported), Disp: 1892 mL, Rfl: 0 .  lisinopril (ZESTRIL) 5 MG tablet, Take 5 mg by mouth daily. (Patient not taking: No sig reported), Disp: , Rfl:    Physical exam:  Vitals:   12/21/20 1116  BP: 116/61  Pulse: (!) 104  Temp: 98.6 F (37 C)  TempSrc: Tympanic  SpO2: 95%  Weight: 159 lb 1.6 oz (72.2 kg)   Physical Exam Constitutional:      General: She is not in acute distress. Cardiovascular:     Rate and Rhythm: Normal rate and regular rhythm.     Heart sounds: Normal heart sounds.  Pulmonary:     Effort: Pulmonary effort is normal.     Breath sounds: Normal breath sounds.  Abdominal:     General: Bowel sounds are normal.     Palpations: Abdomen is soft.  Lymphadenopathy:     Comments: No palpable cervical, supraclavicular, axillary or inguinal adenopathy   Skin:    General: Skin is warm and dry.  Neurological:     Mental Status: She is alert and oriented to person, place, and time.        CMP Latest Ref Rng & Units 12/07/2020  Glucose 70 - 99 mg/dL 155(H)  BUN 8 - 23 mg/dL 12  Creatinine 0.44 - 1.00 mg/dL 0.89  Sodium 135 - 145 mmol/L 134(L)  Potassium 3.5 - 5.1 mmol/L 4.6  Chloride 98 - 111 mmol/L 94(L)  CO2 22 - 32 mmol/L 30  Calcium 8.9 - 10.3 mg/dL 9.2  Total Protein 6.0 - 8.5 g/dL -  Total Bilirubin 0.0 - 1.2 mg/dL -  Alkaline Phos 44 - 121 IU/L -  AST 0 - 40 IU/L -  ALT 0 - 32 IU/L -   CBC Latest Ref Rng & Units 12/21/2020  WBC 4.0 - 10.5 K/uL 13.0(H)  Hemoglobin 12.0 - 15.0 g/dL 11.7(L)  Hematocrit 36.0 - 46.0 % 37.2  Platelets 150 - 400 K/uL 446(H)    No images are attached to the encounter.  CT Angio Chest PE W and/or Wo Contrast  Result Date: 12/07/2020 CLINICAL DATA:  Shortness of breath EXAM: CT ANGIOGRAPHY  CHEST WITH CONTRAST TECHNIQUE: Multidetector CT imaging of the chest was performed using the standard protocol during bolus administration of intravenous contrast. Multiplanar CT image reconstructions and MIPs were obtained to evaluate the vascular anatomy. CONTRAST:  5m OMNIPAQUE IOHEXOL 350 MG/ML SOLN COMPARISON:  12/27/2019 FINDINGS: Cardiovascular: Thoracic aorta and its branches demonstrate atherosclerotic calcification without aneurysmal dilatation or dissection. No cardiac enlargement is noted. No pericardial effusion is seen. Pulmonary artery shows a normal branching pattern. No intraluminal filling defect to suggest pulmonary embolism is noted. No significant coronary calcifications are noted. Mediastinum/Nodes: Thoracic inlet is within normal limits. No sizable hilar or mediastinal adenopathy is noted. The esophagus as visualized is within normal limits. Lungs/Pleura: Lungs are well aerated bilaterally. No focal infiltrate or sizable effusion is seen. A bandlike area of scarring is again noted in the left apex with some associated calcifications stable from the prior exam. Scattered small less than 5 mm nodules are noted particularly in the right upper lobe. No sizable effusion or focal infiltrate is seen. Upper Abdomen: Visualized upper abdomen demonstrates bilateral renal cystic change stable from the previous exam. Musculoskeletal: Degenerative changes of the thoracic spine are noted. No acute rib abnormality is seen. Intrathecal pump catheter is noted in the midthoracic spine. Changes of prior right mastectomy are noted. Review of the MIP images confirms the above findings. IMPRESSION: No evidence of pulmonary emboli. Scattered less than 5 mm nodules particularly within the right upper lobe which are stable in appearance from the prior exam as well as multiple previous exams dating back to 2018 consistent with benign etiology. No further follow-up is recommended. No acute abnormality noted. Aortic  Atherosclerosis (ICD10-I70.0). Electronically Signed   By: MInez CatalinaM.D.   On: 12/07/2020 15:19   ECHOCARDIOGRAM COMPLETE  Result Date: 12/11/2020    ECHOCARDIOGRAM REPORT   Patient Name:   Jordan Chan Date of Exam: 12/11/2020 Medical Rec #:  [id number]         Height:       58.0 in Accession #:    [id number]        Weight:       156.0 lb Date of Birth:  8May 02, 1952         BSA:          1.639 m Patient Age:    89years           BP:           120/70 mmHg Patient Gender: F                  HR:           86 bpm. Exam Location:  [facility] Procedure: 2D Echo, Cardiac Doppler and Color Doppler Indications:    R06.02 SOB  History:        Patient has prior history of Echocardiogram examinations, most                 recent 07/25/2020. Cardiomyopathy, Previous Myocardial  Infarction and CAD, COPD, Signs/Symptoms:Shortness of Breath;                 Risk Factors:Hypertension, Diabetes, Sleep Apnea, Dyslipidemia                 and Former Smoker. Patient has had SOB with leg edema for 3                 months. She indicates she feels weak. She denies chest pain.  Sonographer:    Salvadore Dom RVT, RDCS (AE), RDMS Referring Phys: 34 CHRISTOPHER END  Sonographer Comments: Suboptimal subcostal window, suboptimal parasternal window and patient is morbidly obese. Image acquisition challenging due to patient body habitus. Global longitudinal strain was attempted. IMPRESSIONS  1. Left ventricular ejection fraction, by estimation, is 45%. The left ventricle has mildly decreased function. The left ventricle demonstrates global hypokinesis. Left ventricular diastolic parameters are consistent with Grade II diastolic dysfunction (pseudonormalization).  2. Right ventricular systolic function is normal. The right ventricular size is normal.  3. The mitral valve is normal in structure. No evidence of mitral valve regurgitation.  4. The aortic valve is tricuspid. Aortic valve regurgitation is not  visualized. Comparison(s): EF 45-50%. FINDINGS  Left Ventricle: Left ventricular ejection fraction, by estimation, is 45%. The left ventricle has mildly decreased function. The left ventricle demonstrates global hypokinesis. 3D left ventricular ejection fraction analysis performed but not reported based on interpreter judgement due to suboptimal quality. The left ventricular internal cavity size was normal in size. There is no left ventricular hypertrophy. Left ventricular diastolic parameters are consistent with Grade II diastolic dysfunction (pseudonormalization). Right Ventricle: The right ventricular size is normal. No increase in right ventricular wall thickness. Right ventricular systolic function is normal. Left Atrium: Left atrial size was normal in size. Right Atrium: Right atrial size was normal in size. Pericardium: There is no evidence of pericardial effusion. Mitral Valve: The mitral valve is normal in structure. No evidence of mitral valve regurgitation. MV peak gradient, 6.2 mmHg. The mean mitral valve gradient is 3.0 mmHg. Tricuspid Valve: The tricuspid valve is normal in structure. Tricuspid valve regurgitation is not demonstrated. Aortic Valve: The aortic valve is tricuspid. Aortic valve regurgitation is not visualized. Aortic valve mean gradient measures 4.0 mmHg. Aortic valve peak gradient measures 7.1 mmHg. Aortic valve area, by VTI measures 1.82 cm. Pulmonic Valve: The pulmonic valve was not well visualized. Pulmonic valve regurgitation is not visualized. Aorta: The aortic root is normal in size and structure. Venous: The inferior vena cava was not well visualized. IAS/Shunts: No atrial level shunt detected by color flow Doppler.  LEFT VENTRICLE PLAX 2D LVIDd:         5.20 cm     Diastology LVIDs:         3.60 cm     LV e' medial:    5.85 cm/s LV PW:         1.00 cm     LV E/e' medial:  18.5 LV IVS:        0.60 cm     LV e' lateral:   8.85 cm/s LVOT diam:     1.85 cm     LV E/e' lateral: 12.2  LV SV:         47 LV SV Index:   28 LVOT Area:     2.69 cm  3D Volume EF: LV Volumes (MOD)           3D EF:        52 % LV vol d, MOD A2C: 46.7 ml LV EDV:       145 ml LV vol d, MOD A4C: 45.6 ml LV ESV:       70 ml LV vol s, MOD A2C: 24.8 ml LV SV:        75 ml LV vol s, MOD A4C: 17.4 ml LV SV MOD A2C:     21.9 ml LV SV MOD A4C:     45.6 ml LV SV MOD BP:      25.4 ml RIGHT VENTRICLE RV S prime:     11.00 cm/s TAPSE (M-mode): 2.1 cm LEFT ATRIUM             Index       RIGHT ATRIUM           Index LA diam:        3.30 cm 2.01 cm/m  RA Area:     13.00 cm LA Vol (A2C):   55.0 ml 33.57 ml/m RA Volume:   32.30 ml  19.71 ml/m LA Vol (A4C):   42.2 ml 25.75 ml/m LA Biplane Vol: 50.6 ml 30.88 ml/m  AORTIC VALVE                   PULMONIC VALVE AV Area (Vmax):    1.89 cm    PV Vmax:       0.63 m/s AV Area (Vmean):   1.76 cm    PV Peak grad:  1.6 mmHg AV Area (VTI):     1.82 cm AV Vmax:           133.00 cm/s AV Vmean:          89.300 cm/s AV VTI:            0.255 m AV Peak Grad:      7.1 mmHg AV Mean Grad:      4.0 mmHg LVOT Vmax:         93.30 cm/s LVOT Vmean:        58.400 cm/s LVOT VTI:          0.173 m LVOT/AV VTI ratio: 0.68  AORTA Ao Root diam: 3.10 cm Ao Asc diam:  2.80 cm Ao Arch diam: 2.5 cm MITRAL VALVE MV Area (PHT): 4.26 cm     SHUNTS MV Area VTI:   2.19 cm     Systemic VTI:  0.17 m MV Peak grad:  6.2 mmHg     Systemic Diam: 1.85 cm MV Mean grad:  3.0 mmHg MV Vmax:       1.24 m/s MV Vmean:      81.5 cm/s MV Decel Time: 178 msec MV E velocity: 108.00 cm/s MV A velocity: 126.00 cm/s MV E/A ratio:  0.86 Kate Sable MD Electronically signed by Kate Sable MD Signature Date/Time: 12/11/2020/4:22:25 PM    Final     Assessment and plan- Patient is a 70 y.o. female referred for monocytosis and thrombocytosis  Patient has had some waxing and waning lymphocytosis over the last 7 years as well as thrombocytosis with no clear rising trend.  Differential in her leukocytosis  has been mostly monocytosis which has been mild along with some eosinophilia and occasional neutrophilia.  Thrombocytosis can be secondary to a primary myeloproliferative disorder versus secondary to iron deficiency.  She does have a mild anemia with  a hemoglobin of 11.  I will check ferritin and iron studies Folate myeloma panel today.  Recent B12 and TSH were normal.  With regards to her leukocytosis I will obtain a flow cytometry, pathology smear review, BCR ABL FISH testing as well as JAK2 mutation  I will see her back in 3 to 4 weeks to discuss the results of blood work and further management   Thank you for this kind referral and the opportunity to participate in the care of this patient   Visit Diagnosis 1. Thrombocytosis   2. Monocytosis   3. Normocytic anemia     Dr. Randa Evens, MD, MPH [facility] [phone] 12/21/2020 2:18 PM

## 2020-12-22 LAB — HAPTOGLOBIN: Haptoglobin: 237 mg/dL (ref 37–355)

## 2020-12-25 LAB — BCR-ABL1 FISH
Cells Analyzed: 200
Cells Counted: 200

## 2020-12-25 LAB — MULTIPLE MYELOMA PANEL, SERUM
Albumin SerPl Elph-Mcnc: 3.6 g/dL (ref 2.9–4.4)
Albumin/Glob SerPl: 1 (ref 0.7–1.7)
Alpha 1: 0.2 g/dL (ref 0.0–0.4)
Alpha2 Glob SerPl Elph-Mcnc: 0.9 g/dL (ref 0.4–1.0)
B-Globulin SerPl Elph-Mcnc: 1.1 g/dL (ref 0.7–1.3)
Gamma Glob SerPl Elph-Mcnc: 1.6 g/dL (ref 0.4–1.8)
Globulin, Total: 3.9 g/dL (ref 2.2–3.9)
IgA: 191 mg/dL (ref 87–352)
IgG (Immunoglobin G), Serum: 1548 mg/dL (ref 586–1602)
IgM (Immunoglobulin M), Srm: 147 mg/dL (ref 26–217)
M Protein SerPl Elph-Mcnc: 0.8 g/dL — ABNORMAL HIGH
Total Protein ELP: 7.5 g/dL (ref 6.0–8.5)

## 2020-12-25 LAB — COMP PANEL: LEUKEMIA/LYMPHOMA

## 2020-12-25 NOTE — Progress Notes (Signed)
I called pt and got voicemail and left message that pt labs showed a low folate level and would like to start folic acid daily, also iron stores are low and she suggests oral iron pills over the counter verses the iron infusions . If pt would like to think about this and call back to let us know what she wants to do . Direct number [phone]

## 2020-12-26 ENCOUNTER — Other Ambulatory Visit: Payer: Self-pay

## 2020-12-26 MED ORDER — FOLIC ACID 1 MG PO TABS: 1.0000 mg | ORAL_TABLET | Freq: Every day | ORAL | 1 refills | Status: DC

## 2020-12-27 DIAGNOSIS — M792 Neuralgia and neuritis, unspecified: Secondary | ICD-10-CM | POA: Diagnosis not present

## 2020-12-27 DIAGNOSIS — G8929 Other chronic pain: Secondary | ICD-10-CM | POA: Diagnosis not present

## 2020-12-27 DIAGNOSIS — G894 Chronic pain syndrome: Secondary | ICD-10-CM | POA: Diagnosis not present

## 2020-12-27 DIAGNOSIS — M5442 Lumbago with sciatica, left side: Secondary | ICD-10-CM | POA: Diagnosis not present

## 2020-12-27 DIAGNOSIS — G893 Neoplasm related pain (acute) (chronic): Secondary | ICD-10-CM | POA: Diagnosis not present

## 2020-12-28 ENCOUNTER — Inpatient Hospital Stay: Payer: Medicare HMO

## 2020-12-28 ENCOUNTER — Ambulatory Visit (INDEPENDENT_AMBULATORY_CARE_PROVIDER_SITE_OTHER): Payer: Medicare HMO | Admitting: Licensed Clinical Social Worker

## 2020-12-28 ENCOUNTER — Other Ambulatory Visit: Payer: Self-pay | Admitting: Oncology

## 2020-12-28 VITALS — BP 106/61 | HR 98 | Temp 96.7°F | Resp 16

## 2020-12-28 DIAGNOSIS — J449 Chronic obstructive pulmonary disease, unspecified: Secondary | ICD-10-CM | POA: Diagnosis not present

## 2020-12-28 DIAGNOSIS — D508 Other iron deficiency anemias: Secondary | ICD-10-CM

## 2020-12-28 DIAGNOSIS — E1140 Type 2 diabetes mellitus with diabetic neuropathy, unspecified: Secondary | ICD-10-CM | POA: Diagnosis not present

## 2020-12-28 DIAGNOSIS — D72821 Monocytosis (symptomatic): Secondary | ICD-10-CM | POA: Diagnosis not present

## 2020-12-28 DIAGNOSIS — F324 Major depressive disorder, single episode, in partial remission: Secondary | ICD-10-CM | POA: Diagnosis not present

## 2020-12-28 DIAGNOSIS — E119 Type 2 diabetes mellitus without complications: Secondary | ICD-10-CM | POA: Diagnosis not present

## 2020-12-28 DIAGNOSIS — K219 Gastro-esophageal reflux disease without esophagitis: Secondary | ICD-10-CM | POA: Diagnosis not present

## 2020-12-28 DIAGNOSIS — D509 Iron deficiency anemia, unspecified: Secondary | ICD-10-CM | POA: Insufficient documentation

## 2020-12-28 DIAGNOSIS — R0602 Shortness of breath: Secondary | ICD-10-CM | POA: Diagnosis not present

## 2020-12-28 DIAGNOSIS — R5382 Chronic fatigue, unspecified: Secondary | ICD-10-CM | POA: Diagnosis not present

## 2020-12-28 DIAGNOSIS — E538 Deficiency of other specified B group vitamins: Secondary | ICD-10-CM | POA: Diagnosis not present

## 2020-12-28 DIAGNOSIS — D649 Anemia, unspecified: Secondary | ICD-10-CM | POA: Diagnosis not present

## 2020-12-28 DIAGNOSIS — D75839 Thrombocytosis, unspecified: Secondary | ICD-10-CM | POA: Diagnosis not present

## 2020-12-28 MED ORDER — IRON SUCROSE 20 MG/ML IV SOLN
200.0000 mg | Freq: Once | INTRAVENOUS | Status: AC
Start: 2020-12-28 — End: 2020-12-28
  Administered 2020-12-28: 200 mg via INTRAVENOUS
  Filled 2020-12-28: qty 10

## 2020-12-28 MED ORDER — SODIUM CHLORIDE 0.9 % IV SOLN
Freq: Once | INTRAVENOUS | Status: AC
  Filled 2020-12-28: qty 250

## 2020-12-28 MED ORDER — SODIUM CHLORIDE 0.9 % IV SOLN: 200.0000 mg | INTRAVENOUS | Status: DC

## 2020-12-28 NOTE — Patient Instructions (Signed)
Visit Information  Goals Addressed            This Visit's Progress   . SW-Track and Manage My Symptoms-Depression   On track    Timeframe:  Long-Range Goal Priority:  Medium Start Date: 10/01/20                        Expected End Date:  03/02/21                    Follow Up Date- 03/01/21   Patient Self Care Activities:  . Attend all scheduled provider appointments . Utilize strategies discussed to assist in the management of symptoms . Contact office with any questions or concerns       Patient verbalizes understanding of instructions provided today.  Telephone follow up appointment with care management team member scheduled for:03/01/21  Christa See, MSW, [email or web address] Phone [phone] 3:07 PM

## 2020-12-28 NOTE — Patient Instructions (Addendum)
[facility]  Discharge Instructions: Thank you for choosing [facility] to provide your oncology and hematology care.  If you have a lab appointment with the [facility], please go directly to the [facility] and check in at the registration area.  Wear comfortable clothing and clothing appropriate for easy access to any Portacath or PICC line.   We strive to give you quality time with your provider. You may need to reschedule your appointment if you arrive late (15 or more minutes).  Arriving late affects you and other patients whose appointments are after yours.  Also, if you miss three or more appointments without notifying the office, you may be dismissed from the clinic at the provider's discretion.      For prescription refill requests, have your pharmacy contact our office and allow 72 hours for refills to be completed.    Today you received Venofer    To help prevent nausea and vomiting after your treatment, we encourage you to take your nausea medication as directed.  BELOW ARE SYMPTOMS THAT SHOULD BE REPORTED IMMEDIATELY: . *FEVER GREATER THAN 100.4 F (38 C) OR HIGHER . *CHILLS OR SWEATING . *NAUSEA AND VOMITING THAT IS NOT CONTROLLED WITH YOUR NAUSEA MEDICATION . *UNUSUAL SHORTNESS OF BREATH . *UNUSUAL BRUISING OR BLEEDING . *URINARY PROBLEMS (pain or burning when urinating, or frequent urination) . *BOWEL PROBLEMS (unusual diarrhea, constipation, pain near the anus) . TENDERNESS IN MOUTH AND THROAT WITH OR WITHOUT PRESENCE OF ULCERS (sore throat, sores in mouth, or a toothache) . UNUSUAL RASH, SWELLING OR PAIN  . UNUSUAL VAGINAL DISCHARGE OR ITCHING   Items with * indicate a potential emergency and should be followed up as soon as possible or go to the Emergency Department if any problems should occur.  Please show the CHEMOTHERAPY ALERT CARD or IMMUNOTHERAPY ALERT CARD at check-in to the Emergency Department and triage  nurse.  Should you have questions after your visit or need to cancel or reschedule your appointment, please contact [facility]  [phone] and follow the prompts.  Office hours are 8:00 a.m. to 4:30 p.m. Monday - Friday. Please note that voicemails left after 4:00 p.m. may not be returned until the following business day.  We are closed weekends and major holidays. You have access to a nurse at all times for urgent questions. Please call the main number to the clinic [phone] and follow the prompts.  For any non-urgent questions, you may also contact your provider using MyChart. We now offer e-Visits for anyone 56 and older to request care online for non-urgent symptoms. For details visit [email or web address].   Also download the MyChart app! Go to the app store, search "MyChart", open the app, select [facility], and log in with your MyChart username and password.  Due to Covid, a mask is required upon entering the hospital/clinic. If you do not have a mask, one will be given to you upon arrival. For doctor visits, patients may have 1 support person aged 43 or older with them. For treatment visits, patients cannot have anyone with them due to current Covid guidelines and our immunocompromised population.   Iron Sucrose injection What is this medicine? IRON SUCROSE (AHY ern SOO krohs) is an iron complex. Iron is used to make healthy red blood cells, which carry oxygen and nutrients throughout the body. This medicine is used to treat iron deficiency anemia in people with chronic kidney disease. This medicine may be  used for other purposes; ask your health care provider or pharmacist if you have questions. COMMON BRAND NAME(S): Venofer What should I tell my health care provider before I take this medicine? They need to know if you have any of these conditions:  anemia not caused by low iron levels  heart disease  high levels of iron in the  blood  kidney disease  liver disease  an unusual or allergic reaction to iron, other medicines, foods, dyes, or preservatives  pregnant or trying to get pregnant  breast-feeding How should I use this medicine? This medicine is for infusion into a vein. It is given by a health care professional in a hospital or clinic setting. Talk to your pediatrician regarding the use of this medicine in children. While this drug may be prescribed for children as young as 2 years for selected conditions, precautions do apply. Overdosage: If you think you have taken too much of this medicine contact a poison control center or emergency room at once. NOTE: This medicine is only for you. Do not share this medicine with others. What if I miss a dose? It is important not to miss your dose. Call your doctor or health care professional if you are unable to keep an appointment. What may interact with this medicine? Do not take this medicine with any of the following medications:  deferoxamine  dimercaprol  other iron products This medicine may also interact with the following medications:  chloramphenicol  deferasirox This list may not describe all possible interactions. Give your health care provider a list of all the medicines, herbs, non-prescription drugs, or dietary supplements you use. Also tell them if you smoke, drink alcohol, or use illegal drugs. Some items may interact with your medicine. What should I watch for while using this medicine? Visit your doctor or healthcare professional regularly. Tell your doctor or healthcare professional if your symptoms do not start to get better or if they get worse. You may need blood work done while you are taking this medicine. You may need to follow a special diet. Talk to your doctor. Foods that contain iron include: whole grains/cereals, dried fruits, beans, or peas, leafy green vegetables, and organ meats (liver, kidney). What side effects may I notice  from receiving this medicine? Side effects that you should report to your doctor or health care professional as soon as possible:  allergic reactions like skin rash, itching or hives, swelling of the face, lips, or tongue  breathing problems  changes in blood pressure  cough  fast, irregular heartbeat  feeling faint or lightheaded, falls  fever or chills  flushing, sweating, or hot feelings  joint or muscle aches/pains  seizures  swelling of the ankles or feet  unusually weak or tired Side effects that usually do not require medical attention (report to your doctor or health care professional if they continue or are bothersome):  diarrhea  feeling achy  headache  irritation at site where injected  nausea, vomiting  stomach upset  tiredness This list may not describe all possible side effects. Call your doctor for medical advice about side effects. You may report side effects to FDA at 1-800-FDA-1088. Where should I keep my medicine? This drug is given in a hospital or clinic and will not be stored at home. NOTE: This sheet is a summary. It may not cover all possible information. If you have questions about this medicine, talk to your doctor, pharmacist, or health care provider.  2021 Elsevier/Gold Standard (2011-05-01  17:14:35)  

## 2020-12-28 NOTE — Chronic Care Management (AMB) (Signed)
Chronic Care Management    Clinical Social Work Note  12/28/2020 Name: Jordan Chan MRN: [id number] DOB: 1950/11/09  Jordan Chan is a 70 y.o. year old female who is a primary care patient of [name], [name], NP. The CCM team was consulted to assist the patient with chronic disease management and/or care coordination needs related to: Mental Health Counseling and Resources.   Engaged with patient by telephone for follow up visit in response to provider referral for social work chronic care management and care coordination services.   Consent to Services:  The patient was given information about Chronic Care Management services, agreed to services, and gave verbal consent prior to initiation of services.  Please see initial visit note for detailed documentation.   Patient agreed to services and consent obtained.   Assessment: Patient is engaged in conversation, continues to maintain positive progress with care plan goals. Patient reports feelings of anxiety with upcoming infusion treatments. Strategies to assist in management of symptoms were identified. See Care Plan below for interventions and patient self-care actives. Recent life changes [name]: Chronic health conditions Recommendation: Patient may benefit from, and is in agreement to continue utilizing healthy coping skills to manage symptoms.  Follow up Plan: Patient would like continued follow-up.  CCM LCSW will follow up with patient on 03/01/21. Patient will call office if needed prior to next encounter.     SDOH (Social Determinants of Health) assessments and interventions performed:    Advanced Directives Status: Not addressed in this encounter.  CCM Care Plan  Allergies  Allergen Reactions  . Other Palpitations    IV steroids  . Pain Patch [Menthol] Anaphylaxis  . Fentanyl   . Avelox [Moxifloxacin Hcl In Nacl] Other (See Comments)    Upset stomach  . Doxycycline Diarrhea  . Erythromycin Nausea Only and  Other (See Comments)    Can take a Z-Pak just fine Other reaction(s): Other (See Comments) Can take Z-Pak  Can take a Z-Pak just fine  . Moxifloxacin Hcl Other (See Comments)    Upset stomach Upset stomach  . Oxycontin [Oxycodone] Hives  . Ozempic [Semaglutide] Nausea Only    Outpatient Encounter Medications as of 12/28/2020  Medication Sig  . Accu-Chek Softclix Lancets lancets TEST BLOOD SUGAR THREE TIMES DAILY  . albuterol (PROVENTIL HFA;VENTOLIN HFA) 108 (90 BASE) MCG/ACT inhaler Inhale 2 puffs into the lungs every 6 (six) hours as needed for wheezing or shortness of breath.   . Alcohol Swabs (B-D SINGLE USE SWABS REGULAR) PADS USE TWICE DAILY  WITH  SUGAR  CHECKS  . atorvastatin (LIPITOR) 20 MG tablet Take 1 tablet (20 mg total) by mouth daily.  . Blood Glucose Monitoring Suppl (TRUE METRIX METER) w/Device KIT Use to check blood sugar 4 times a day  . cholecalciferol (VITAMIN D3) 25 MCG (1000 UNIT) tablet Take 1,000 Units by mouth daily.  . Cyanocobalamin 1000 MCG/ML KIT Inject 1,000 mcg as directed every 30 (thirty) days.  . cyclobenzaprine (FLEXERIL) 5 MG tablet Take 5 mg by mouth 2 (two) times daily as needed for muscle spasms. Takes very rarely  . diclofenac (VOLTAREN) 50 MG EC tablet TAKE 1 TABLET THREE TIMES DAILY  . Dulaglutide (TRULICITY) 4.5 VO/5.3GU SOPN Inject 4.5 mg as directed once a week.  . folic acid (FOLVITE) 1 MG tablet Take 1 tablet (1 mg total) by mouth daily.  . furosemide (LASIX) 40 MG tablet Take 1 tablet (40 mg total) by mouth daily.  [name] gabapentin (NEURONTIN) 800 MG tablet  Take 1 tablet (800 mg total) by mouth 3 (three) times daily.  [name] glucose blood (TRUE METRIX BLOOD GLUCOSE TEST) test strip To check blood sugar 3 times a day.  . lactulose (CHRONULAC) 10 GM/15ML solution Take 45 mLs (30 g total) by mouth daily as needed for mild constipation or severe constipation. (Patient not taking: No sig reported)  . lisinopril (ZESTRIL) 2.5 MG tablet Take 1 tablet (2.5  mg total) by mouth daily.  [name] lisinopril (ZESTRIL) 5 MG tablet Take 5 mg by mouth daily. (Patient not taking: No sig reported)  . metFORMIN (GLUCOPHAGE) 1000 MG tablet Take 1 tablet (1,000 mg total) by mouth 2 (two) times daily with a meal.  . metoprolol succinate (TOPROL-XL) 25 MG 24 hr tablet Take 0.5 tablets (12.5 mg total) by mouth daily.  . montelukast (SINGULAIR) 10 MG tablet Take 10 mg by mouth at bedtime.  [name] PAIN MANAGEMENT INTRATHECAL, IT, PUMP 1 each by Intrathecal route. Intrathecal (IT) medication:  Morphine Patient does not remember current. Adjusted every 2.5 months at [facility] in [place].  . TRELEGY ELLIPTA 100-62.5-25 MCG/INH AEPB INHALE 1 PUFF INTO THE LUNGS DAILY.  [name] TRESIBA FLEXTOUCH 100 UNIT/ML FlexTouch Pen INJECT 30 UNITS INTO THE SKIN AT BEDTIME. (Patient taking differently: Inject 40 Units into the skin at bedtime.)  . venlafaxine XR (EFFEXOR-XR) 150 MG 24 hr capsule Take 1 capsule (150 mg total) by mouth daily.   Facility-Administered Encounter Medications as of 12/28/2020  Medication  . [COMPLETED] 0.9 %  sodium chloride infusion  . iron sucrose (VENOFER) injection 200 mg  . [DISCONTINUED] iron sucrose (VENOFER) 200 mg in sodium chloride 0.9 % 100 mL IVPB    Patient Active Problem List   Diagnosis Date Noted  . Iron deficiency anemia 12/28/2020  . Type 2 diabetes, controlled, with neuropathy ([facility]) 07/20/2020  . Insulin dependent type 2 diabetes mellitus ([facility]) 07/20/2020  . SOB (shortness of breath) 07/04/2020  . History of pulmonary embolus (PE) 07/04/2020  . Aortic atherosclerosis ([place]) 07/01/2020  . SVT (supraventricular tachycardia) ([place])   . Vitamin D deficiency 07/17/2019  . Stress-induced cardiomyopathy 07/23/2017  . History of non-ST elevation myocardial infarction (NSTEMI)   . Presence of intrathecal pump 04/27/2017  . Advanced care planning/counseling discussion 11/14/2016  . Cervical scoliosis 05/20/2016  . Hypertension  associated with diabetes ([place]) 06/18/2015  . B12 deficiency 03/19/2015  . Sleep apnea 11/16/2014  . Allergic rhinitis 11/16/2014  . History of breast cancer 11/16/2014  . GERD (gastroesophageal reflux disease) 11/16/2014  . COPD, moderate ([place]) 11/16/2014  . Obesity 11/16/2014  . Depression, major, single episode, in partial remission ([place]) 11/16/2014  . Insomnia 11/16/2014  . Hyperlipidemia associated with type 2 diabetes mellitus ([place]) 11/16/2014  . History of sarcoma 09/27/2013  . Chronic pain syndrome 09/30/2012    Conditions to be addressed/monitored: Anxiety and Depression; Mental Health Concerns   Care Plan : General Social Work (Adult)  Updates made by Rebekah Chesterfield, LCSW since 12/28/2020 12:00 AM    Problem: Coping Skills (General Plan of Care)     Long-Range Goal: Coping Skills Enhanced   Start Date: 10/01/2020  This Visit's Progress: On track  Priority: Medium  Note:   Timeframe:  Long-Range Goal Priority:  Medium Start Date: 10/01/20                        Expected End Date:  7/308/22  Follow Up Date- 03/01/21   -Current Barriers:  . Financial constraints  . Limited social support . Level of care concerns . ADL IADL limitations . Social Isolation . Depression  Clinical Social Work Clinical Goal(s):  [name] Over the next 120 days, patient/caregiver will work with SW to address concerns related to lack of support/resource connection. LCSW will assist patient in gaining additional support/resource connection and community resource education in order to maintain health and mental health appropriately  .  Over the next 120 days, patient will demonstrate improved adherence to self care as evidenced by implementing healthy self-care into her daily routine such as: attending all medical appointments, deep breathing exercises, taking time for self and self-reflection, taking medications as prescribed, drinking water and daily exercise to improve mobility.   .  Over the next 120 days, patient will demonstrate improved health management independence as evidenced by implementing healthy self-care skills and positive support/resources into her daily routine to help cope with stressors and improve overall physical and mental health and well-being   Interventions: . Patient interviewed and appropriate assessments performed . Patient reports feeling overwhelmed with chronic health conditions, stating "I have so much going on" Patient was recently diagnosed with CHF. Recently patient's Hematologist found patient's folic acid and iron to be low resulting in her having scheduled iron infusions starting today . Patient reports feelings of anxiety regarding scheduled infusions due to having small veins that have a hx of exploding during injections. CCM LCSW discussed strategies to assist in grounding and management of anxiety and/or depression . Patient agreed to playing solitaire on phone to assist with anxiety management . Patient has a strong support system . CCM LCSW reviewed upcoming appointments . Patient reports being very stable and declines any urgent concerns or needs at this time.  . Discussed plans with patient for ongoing care management follow up and provided patient with direct contact information for care management team  Patient Self Care Activities:  . Attend all scheduled provider appointments . Utilize strategies discussed to assist in the management of symptoms . Contact office with any questions or concerns      Christa See, MSW, [email or web address] Phone [phone] 3:05 PM

## 2020-12-28 NOTE — Progress Notes (Signed)
Patient tolerated first Venofer infusion well today, no concerns voiced. Patient monitored post transfusion 30 min, discharged. Stable.

## 2021-01-01 ENCOUNTER — Inpatient Hospital Stay: Payer: Medicare HMO

## 2021-01-01 ENCOUNTER — Other Ambulatory Visit: Payer: Self-pay

## 2021-01-01 VITALS — BP 105/68 | HR 95 | Temp 97.5°F | Resp 18

## 2021-01-01 DIAGNOSIS — R5382 Chronic fatigue, unspecified: Secondary | ICD-10-CM | POA: Diagnosis not present

## 2021-01-01 DIAGNOSIS — J449 Chronic obstructive pulmonary disease, unspecified: Secondary | ICD-10-CM | POA: Diagnosis not present

## 2021-01-01 DIAGNOSIS — E119 Type 2 diabetes mellitus without complications: Secondary | ICD-10-CM | POA: Diagnosis not present

## 2021-01-01 DIAGNOSIS — D508 Other iron deficiency anemias: Secondary | ICD-10-CM

## 2021-01-01 DIAGNOSIS — D75839 Thrombocytosis, unspecified: Secondary | ICD-10-CM | POA: Diagnosis not present

## 2021-01-01 DIAGNOSIS — R0602 Shortness of breath: Secondary | ICD-10-CM | POA: Diagnosis not present

## 2021-01-01 DIAGNOSIS — D72821 Monocytosis (symptomatic): Secondary | ICD-10-CM | POA: Diagnosis not present

## 2021-01-01 DIAGNOSIS — K219 Gastro-esophageal reflux disease without esophagitis: Secondary | ICD-10-CM | POA: Diagnosis not present

## 2021-01-01 DIAGNOSIS — E538 Deficiency of other specified B group vitamins: Secondary | ICD-10-CM | POA: Diagnosis not present

## 2021-01-01 DIAGNOSIS — D649 Anemia, unspecified: Secondary | ICD-10-CM | POA: Diagnosis not present

## 2021-01-01 LAB — JAK2 GENOTYPR

## 2021-01-01 MED ORDER — SODIUM CHLORIDE 0.9 % IV SOLN
Freq: Once | INTRAVENOUS | Status: AC
Start: 2021-01-01 — End: 2021-01-01
  Filled 2021-01-01: qty 250

## 2021-01-01 MED ORDER — SODIUM CHLORIDE 0.9 % IV SOLN: 200.0000 mg | INTRAVENOUS | Status: DC

## 2021-01-01 MED ORDER — IRON SUCROSE 20 MG/ML IV SOLN
200.0000 mg | Freq: Once | INTRAVENOUS | Status: AC
Start: 2021-01-01 — End: 2021-01-01
  Administered 2021-01-01: 200 mg via INTRAVENOUS
  Filled 2021-01-01: qty 10

## 2021-01-01 NOTE — Patient Instructions (Signed)
[facility]    Discharge Instructions:  Thank you for choosing [facility] to provide your oncology and hematology care.  If you have a lab appointment with the Cancer Center, please go directly to the Cancer Center and check in at the registration area.  We strive to give you quality time with your provider. You may need to reschedule your appointment if you arrive late (15 or more minutes).  Arriving late affects you and other patients whose appointments are after yours.  Also, if you miss three or more appointments without notifying the office, you may be dismissed from the clinic at the provider's discretion.      For prescription refill requests, have your pharmacy contact our office and allow 72 hours for refills to be completed.    Today you received the following treatment: Venofer.  BELOW ARE SYMPTOMS THAT SHOULD BE REPORTED IMMEDIATELY: . *FEVER GREATER THAN 100.4 F (38 C) OR HIGHER . *CHILLS OR SWEATING . *NAUSEA AND VOMITING THAT IS NOT CONTROLLED WITH YOUR NAUSEA MEDICATION . *UNUSUAL SHORTNESS OF BREATH . *UNUSUAL BRUISING OR BLEEDING . *URINARY PROBLEMS (pain or burning when urinating, or frequent urination) . *BOWEL PROBLEMS (unusual diarrhea, constipation, pain near the anus) . TENDERNESS IN MOUTH AND THROAT WITH OR WITHOUT PRESENCE OF ULCERS (sore throat, sores in mouth, or a toothache) . UNUSUAL RASH, SWELLING OR PAIN  . UNUSUAL VAGINAL DISCHARGE OR ITCHING   Items with * indicate a potential emergency and should be followed up as soon as possible or go to the Emergency Department if any problems should occur.  Should you have questions after your visit or need to cancel or reschedule your appointment, please contact [facility]  [phone] and follow the prompts.  Office hours are 8:00 a.m. to 4:30 p.m. Monday - Friday. Please note that voicemails left after 4:00 p.m. may not be  returned until the following business day.  We are closed weekends and major holidays. You have access to a nurse at all times for urgent questions. Please call the main number to the clinic [phone] and follow the prompts.  For any non-urgent questions, you may also contact your provider using MyChart. We now offer e-Visits for anyone 18 and older to request care online for non-urgent symptoms. For details visit [email or web address].   Also download the MyChart app! Go to the app store, search "MyChart", open the app, select [facility], and log in with your MyChart username and password.  Due to Covid, a mask is required upon entering the hospital/clinic. If you do not have a mask, one will be given to you upon arrival. For doctor visits, patients may have 1 support person aged 18 or older with them. For treatment visits, patients cannot have anyone with them due to current Covid guidelines and our immunocompromised population.   Iron Sucrose injection  What is this medicine? IRON SUCROSE (AHY ern SOO krohs) is an iron complex. Iron is used to make healthy red blood cells, which carry oxygen and nutrients throughout the body. This medicine is used to treat iron deficiency anemia in people with chronic kidney disease. This medicine may be used for other purposes; ask your health care provider or pharmacist if you have questions. COMMON BRAND NAME(S): Venofer What should I tell my health care provider before I take this medicine? They need to know if you have any of these conditions:  anemia not caused by low iron levels    heart disease  high levels of iron in the blood  kidney disease  liver disease  an unusual or allergic reaction to iron, other medicines, foods, dyes, or preservatives  pregnant or trying to get pregnant  breast-feeding How should I use this medicine? This medicine is for infusion into a vein. It is given by a health care professional in a hospital or clinic  setting. Talk to your pediatrician regarding the use of this medicine in children. While this drug may be prescribed for children as young as 2 years for selected conditions, precautions do apply. Overdosage: If you think you have taken too much of this medicine contact a poison control center or emergency room at once. NOTE: This medicine is only for you. Do not share this medicine with others. What if I miss a dose? It is important not to miss your dose. Call your doctor or health care professional if you are unable to keep an appointment. What may interact with this medicine? Do not take this medicine with any of the following medications:  deferoxamine  dimercaprol  other iron products This medicine may also interact with the following medications:  chloramphenicol  deferasirox This list may not describe all possible interactions. Give your health care provider a list of all the medicines, herbs, non-prescription drugs, or dietary supplements you use. Also tell them if you smoke, drink alcohol, or use illegal drugs. Some items may interact with your medicine. What should I watch for while using this medicine? Visit your doctor or healthcare professional regularly. Tell your doctor or healthcare professional if your symptoms do not start to get better or if they get worse. You may need blood work done while you are taking this medicine. You may need to follow a special diet. Talk to your doctor. Foods that contain iron include: whole grains/cereals, dried fruits, beans, or peas, leafy green vegetables, and organ meats (liver, kidney). What side effects may I notice from receiving this medicine? Side effects that you should report to your doctor or health care professional as soon as possible:  allergic reactions like skin rash, itching or hives, swelling of the face, lips, or tongue  breathing problems  changes in blood pressure  cough  fast, irregular heartbeat  feeling faint  or lightheaded, falls  fever or chills  flushing, sweating, or hot feelings  joint or muscle aches/pains  seizures  swelling of the ankles or feet  unusually weak or tired Side effects that usually do not require medical attention (report to your doctor or health care professional if they continue or are bothersome):  diarrhea  feeling achy  headache  irritation at site where injected  nausea, vomiting  stomach upset  tiredness This list may not describe all possible side effects. Call your doctor for medical advice about side effects. You may report side effects to FDA at 1-800-FDA-1088. Where should I keep my medicine? This drug is given in a hospital or clinic and will not be stored at home. NOTE: This sheet is a summary. It may not cover all possible information. If you have questions about this medicine, talk to your doctor, pharmacist, or health care provider.  2021 Elsevier/Gold Standard (2011-05-01 17:14:35)  

## 2021-01-02 ENCOUNTER — Encounter: Payer: Self-pay | Admitting: Internal Medicine

## 2021-01-02 ENCOUNTER — Other Ambulatory Visit: Payer: Self-pay

## 2021-01-02 ENCOUNTER — Other Ambulatory Visit: Payer: Self-pay | Admitting: *Deleted

## 2021-01-02 ENCOUNTER — Ambulatory Visit (INDEPENDENT_AMBULATORY_CARE_PROVIDER_SITE_OTHER): Payer: Medicare HMO | Admitting: Internal Medicine

## 2021-01-02 VITALS — BP 120/64 | HR 91 | Ht 58.5 in | Wt 159.0 lb

## 2021-01-02 DIAGNOSIS — I5022 Chronic systolic (congestive) heart failure: Secondary | ICD-10-CM

## 2021-01-02 MED ORDER — METOPROLOL SUCCINATE ER 25 MG PO TB24: 25.0000 mg | ORAL_TABLET | Freq: Every day | ORAL | 0 refills | Status: DC

## 2021-01-02 NOTE — Progress Notes (Signed)
Follow-up Outpatient Visit Date: 01/02/2021  Primary Care Provider: [name], NP [place] [address]  Chief Complaint: Shortness of breath and fatigue  HPI:  Ms. Crossin is a 70 y.o. female with history of stress-induced cardiomyopathy in 04/2017 in the setting of pain pump malfunction and opioid withdrawal, PEfollowing hospitalization for pneumonia (summer 2021),chronic low back pain, diabetes mellitus, COPD,GERD, sleep apnea, and IBS, who presents for follow-up of shortness of breath and cardiomyopathy.  I last saw her on 12/07/2020 at which time she reported feeling short of breath and tired "all the time" since January.  She was tachycardic, raising concern for PE.  D-dimer was elevated, prompting referral to the emergency department where CTA of the chest was negative for PE.  Subsequent echocardiogram showed LVEF of 45% with grade 2 diastolic dysfunction.  RV size and function were normal without significant valvular abnormality.  Echo was similar to prior study in 07/2019.  Today, Ms. Valentine feels about the same or may be slightly better compared with her prior visit.  She was recently diagnosed with anemia and has been started on folate acid supplementation as well as iron infusions.  She continues to have shortness of breath with mild activity and even when talking for extended periods at rest.  She reports chronic cough.  She has not had any chest pain or palpitations.  Leg edema has been minimal.  At times, she feels off balance as if she is walking sideways.  She has not fallen or passed out.  She notes that her blood pressure was recently low, prompting dose reduction of lisinopril from 5 mg to 2.5 mg daily.  --------------------------------------------------------------------------------------------------  Cardiovascular History & Procedures: Cardiovascular Problems:  Stress-induced cardiomyopathy  Pulmonary embolism  Risk Factors:  Diabetes mellitus,  prior tobacco use, and age greater than 15  Cath/PCI:  LHC (04/28/17): No angiographically significant CAD. Severely reduced LVEF (25-30%) with mid and apical akinesis consistent with stress-induced cardiomyopathy. Mildly elevated left ventricular filling pressure.  CV Surgery:  None  EP Procedures and Devices:  None  Non-Invasive Evaluation(s):  TTE (12/11/2020): Normal LV size and wall thickness.  LVEF 45% with global hypokinesis.  Grade 2 diastolic dysfunction.  Normal RV size and function.  Normal biatrial size.  No pericardial effusion.  No significant valvular abnormalities.  Left lower extremity venous duplex (07/26/2020): No evidence of DVT in the left lower extremity.  TTE (07/25/2020): Normal LV size and wall thickness.  LVEF 45-50% with global hypokinesis and grade 2 diastolic dysfunction.  Normal RV size and function.  Normal biatrial size.  No pericardial effusion.  No significant valvular abnormality.  Normal CVP.  Limited TTE (08/13/17): LVEF 50-55%.  TTE (05/15/17,[facility], [place]):Normal LV size and wall thickness. LVEF 45-50% with global hypokinesis. Grade 1 diastolic dysfunction. Normal RV size and function.  TTE (04/28/17): Upper normal LV size with normal wall thickness. LVEF severely reduced at 25-30%. There is severe hypokinesis of the mid and apical myocardium suggestive of stress-induced cardiomyopathy. Mild left atrial enlargement. Poorly visualized RV, grossly normal in size.   Recent CV Pertinent Labs: Lab Results  Component Value Date   CHOL 151 10/25/2020   CHOL 127 07/26/2019   HDL 73 10/25/2020   LDLCALC 59 10/25/2020   TRIG 104 10/25/2020   TRIG 104 07/26/2019   INR 1.0 12/07/2020   BNP 12.6 12/07/2020   K 4.6 12/07/2020   K 3.5 09/14/2013   MG 2.0 12/19/2019   BUN 12 12/07/2020  BUN 11 11/29/2020   BUN 8 09/14/2013   CREATININE 0.89 12/07/2020   CREATININE 0.68 09/14/2013    Past medical and surgical history  were reviewed and updated in EPIC.  Current Meds  Medication Sig  . Accu-Chek Softclix Lancets lancets TEST BLOOD SUGAR THREE TIMES DAILY  . albuterol (PROVENTIL HFA;VENTOLIN HFA) 108 (90 BASE) MCG/ACT inhaler Inhale 2 puffs into the lungs every 6 (six) hours as needed for wheezing or shortness of breath.   . Alcohol Swabs (B-D SINGLE USE SWABS REGULAR) PADS USE TWICE DAILY  WITH  SUGAR  CHECKS  . Blood Glucose Monitoring Suppl (TRUE METRIX METER) w/Device KIT Use to check blood sugar 4 times a day  . cholecalciferol (VITAMIN D3) 25 MCG (1000 UNIT) tablet Take 1,000 Units by mouth daily.  . Cyanocobalamin 1000 MCG/ML KIT Inject 1,000 mcg as directed every 30 (thirty) days.  . cyclobenzaprine (FLEXERIL) 5 MG tablet Take 5 mg by mouth 2 (two) times daily as needed for muscle spasms. Takes very rarely  . diclofenac (VOLTAREN) 50 MG EC tablet TAKE 1 TABLET THREE TIMES DAILY  . Dulaglutide (TRULICITY) 4.5 VP/7.1GG SOPN Inject 4.5 mg as directed once a week.  . folic acid (FOLVITE) 1 MG tablet Take 1 tablet (1 mg total) by mouth daily.  . furosemide (LASIX) 40 MG tablet Take 1 tablet (40 mg total) by mouth daily.  [name] gabapentin (NEURONTIN) 800 MG tablet Take 1 tablet (800 mg total) by mouth 3 (three) times daily.  [name] glucose blood (TRUE METRIX BLOOD GLUCOSE TEST) test strip To check blood sugar 3 times a day.  . insulin degludec (TRESIBA FLEXTOUCH) 100 UNIT/ML FlexTouch Pen Inject 40 Units into the skin daily.  [name] lactulose (CHRONULAC) 10 GM/15ML solution Take 45 mLs (30 g total) by mouth daily as needed for mild constipation or severe constipation.  [name] lisinopril (ZESTRIL) 2.5 MG tablet Take 1 tablet (2.5 mg total) by mouth daily.  . metFORMIN (GLUCOPHAGE) 1000 MG tablet Take 1 tablet (1,000 mg total) by mouth 2 (two) times daily with a meal.  . montelukast (SINGULAIR) 10 MG tablet Take 10 mg by mouth at bedtime.  [name] PAIN MANAGEMENT INTRATHECAL, IT, PUMP 1 each by Intrathecal route. Intrathecal (IT)  medication:  Morphine Patient does not remember current. Adjusted every 2.5 months at [facility] in [place].  . TRELEGY ELLIPTA 100-62.5-25 MCG/INH AEPB INHALE 1 PUFF INTO THE LUNGS DAILY.  [name] venlafaxine XR (EFFEXOR-XR) 150 MG 24 hr capsule Take 1 capsule (150 mg total) by mouth daily.  . [DISCONTINUED] atorvastatin (LIPITOR) 20 MG tablet Take 1 tablet (20 mg total) by mouth daily.  . [DISCONTINUED] metoprolol succinate (TOPROL-XL) 25 MG 24 hr tablet Take 0.5 tablets (12.5 mg total) by mouth daily.    Allergies: Other, Pain patch [menthol], Fentanyl, Avelox [moxifloxacin hcl in nacl], Doxycycline, Erythromycin, Moxifloxacin hcl, Oxycontin [oxycodone], and Ozempic [semaglutide]  Social History   Tobacco Use  . Smoking status: Former Smoker    Packs/day: 1.00    Years: 30.00    Pack years: 30.00    Types: Cigarettes    Quit date: 04/30/2004    Years since quitting: 16.6  . Smokeless tobacco: Never Used  Vaping Use  . Vaping Use: Never used  Substance Use Topics  . Alcohol use: No  . Drug use: No    Family History  Problem Relation Age of Onset  . Cancer Mother 46       lung  . Thyroid disease Mother   .  Cancer Father 62       Pancreatic  . Hypertension Sister   . Cancer Sister        "cancer on face"  . Hyperlipidemia Sister   . Osteoporosis Maternal Grandmother   . Hyperlipidemia Son   . Seizures Son   . Cancer Paternal Grandmother        colon  . Arthritis Paternal Grandfather   . Breast cancer Neg Hx     Review of Systems: A 12-system review of systems was performed and was negative except as noted in the HPI.  --------------------------------------------------------------------------------------------------  Physical Exam: BP 120/64 (BP Location: Left Arm, Patient Position: Sitting, Cuff Size: Normal)   Pulse 91   Ht 4' 10.5" (1.486 m)   Wt 159 lb (72.1 kg)   LMP  (LMP Unknown)   SpO2 95%   BMI 32.67 kg/m   General:  NAD. Neck: No  JVD or HJR. Lungs: Clear to auscultation bilaterally without wheezes or crackles. Heart: Regular rate and rhythm without murmurs, rubs, or gallops. Abdomen: Soft, nontender, nondistended. Extremities: No lower extremity edema.  EKG: Normal sinus rhythm with low voltage and poor R wave progression.  Incomplete LBBB is no longer present.  Left axis deviation has also resolved.  Lab Results  Component Value Date   WBC 13.0 (H) 12/21/2020   HGB 11.7 (L) 12/21/2020   HCT 37.2 12/21/2020   MCV 89.0 12/21/2020   PLT 446 (H) 12/21/2020    Lab Results  Component Value Date   NA 134 (L) 12/07/2020   K 4.6 12/07/2020   CL 94 (L) 12/07/2020   CO2 30 12/07/2020   BUN 12 12/07/2020   CREATININE 0.89 12/07/2020   GLUCOSE 155 (H) 12/07/2020   ALT 10 11/29/2020    Lab Results  Component Value Date   CHOL 151 10/25/2020   HDL 73 10/25/2020   LDLCALC 59 10/25/2020   TRIG 104 10/25/2020    --------------------------------------------------------------------------------------------------  ASSESSMENT AND PLAN: Chronic HFrEF: Overall, Ms. Foland feels about the same as at our last visit.  Recent echo showed mildly reduced LVEF (45%) that is unchanged since 07/2020.  I am not entirely convinced that this alone explains her fatigue and shortness of breath.  She appears euvolemic on examination.  We will continue furosemide 40 mg daily and increase metoprolol succinate to 25 mg daily.  Given recent soft blood pressure leading to de-escalation of lisinopril, I will defer changing this medicine today.  I have a low suspicion of ischemic heart disease is driving her symptoms given catheterization without CAD in 04/2017.  However, if symptoms continue and no alternative explanation is uncovered, repeat ischemia evaluation will need to be considered.  Ms. Burkholder should continue management of her anemia and ongoing work-up of leukocytosis through Dr. Janese Banks.  Follow-up: Return to clinic in 6  weeks.  [name], MD 01/02/2021 1:07 PM

## 2021-01-02 NOTE — Patient Instructions (Signed)
Medication Instructions:  Your physician has recommended you make the following change in your medication:   INCREASE Metoprolol Succinate to 25mg  - Take one tablet DAILY  HOLD Atorvastatin until your next follow up appointment   *If you need a refill on your cardiac medications before your next appointment, please call your pharmacy*   Lab Work:  None ordered  Testing/Procedures:  None ordered  Follow-Up: At [facility], you and your health needs are our priority.  As part of our continuing mission to provide you with exceptional heart care, we have created designated Provider Care Teams.  These Care Teams include your primary Cardiologist (physician) and Advanced Practice Providers (APPs -  Physician Assistants and Nurse Practitioners) who all work together to provide you with the care you need, when you need it.  We recommend signing up for the patient portal called "MyChart".  Sign up information is provided on this After Visit Summary.  MyChart is used to connect with patients for Virtual Visits (Telemedicine).  Patients are able to view lab/test results, encounter notes, upcoming appointments, etc.  Non-urgent messages can be sent to your provider as well.   To learn more about what you can do with MyChart, go to [email or web address].    Your next appointment:   6 week(s)  The format for your next appointment:   In Person  Provider:   You may see [name], MD or one of the following Advanced Practice Providers on your designated Care Team:    Murray Hodgkins, NP  [name], PA-C  Marrianne Mood, PA-C  Cadence [place], [place]  Laurann Montana, NP

## 2021-01-03 ENCOUNTER — Inpatient Hospital Stay: Payer: Medicare HMO | Attending: Oncology

## 2021-01-03 VITALS — BP 106/70 | HR 88

## 2021-01-03 DIAGNOSIS — I509 Heart failure, unspecified: Secondary | ICD-10-CM | POA: Insufficient documentation

## 2021-01-03 DIAGNOSIS — J449 Chronic obstructive pulmonary disease, unspecified: Secondary | ICD-10-CM | POA: Diagnosis not present

## 2021-01-03 DIAGNOSIS — K589 Irritable bowel syndrome without diarrhea: Secondary | ICD-10-CM | POA: Insufficient documentation

## 2021-01-03 DIAGNOSIS — G4730 Sleep apnea, unspecified: Secondary | ICD-10-CM | POA: Insufficient documentation

## 2021-01-03 DIAGNOSIS — E119 Type 2 diabetes mellitus without complications: Secondary | ICD-10-CM | POA: Diagnosis not present

## 2021-01-03 DIAGNOSIS — J45909 Unspecified asthma, uncomplicated: Secondary | ICD-10-CM | POA: Diagnosis not present

## 2021-01-03 DIAGNOSIS — I110 Hypertensive heart disease with heart failure: Secondary | ICD-10-CM | POA: Insufficient documentation

## 2021-01-03 DIAGNOSIS — E7800 Pure hypercholesterolemia, unspecified: Secondary | ICD-10-CM | POA: Diagnosis not present

## 2021-01-03 DIAGNOSIS — Z79899 Other long term (current) drug therapy: Secondary | ICD-10-CM | POA: Diagnosis not present

## 2021-01-03 DIAGNOSIS — Z794 Long term (current) use of insulin: Secondary | ICD-10-CM | POA: Insufficient documentation

## 2021-01-03 DIAGNOSIS — Z8673 Personal history of transient ischemic attack (TIA), and cerebral infarction without residual deficits: Secondary | ICD-10-CM | POA: Diagnosis not present

## 2021-01-03 DIAGNOSIS — D75839 Thrombocytosis, unspecified: Secondary | ICD-10-CM | POA: Insufficient documentation

## 2021-01-03 DIAGNOSIS — D72821 Monocytosis (symptomatic): Secondary | ICD-10-CM | POA: Diagnosis present

## 2021-01-03 DIAGNOSIS — D509 Iron deficiency anemia, unspecified: Secondary | ICD-10-CM | POA: Diagnosis present

## 2021-01-03 DIAGNOSIS — D508 Other iron deficiency anemias: Secondary | ICD-10-CM

## 2021-01-03 DIAGNOSIS — Z9981 Dependence on supplemental oxygen: Secondary | ICD-10-CM | POA: Insufficient documentation

## 2021-01-03 MED ORDER — SODIUM CHLORIDE 0.9 % IV SOLN
Freq: Once | INTRAVENOUS | Status: AC
  Filled 2021-01-03: qty 250

## 2021-01-03 MED ORDER — SODIUM CHLORIDE 0.9 % IV SOLN: 200.0000 mg | INTRAVENOUS | Status: DC

## 2021-01-03 MED ORDER — IRON SUCROSE 20 MG/ML IV SOLN
200.0000 mg | Freq: Once | INTRAVENOUS | Status: AC
  Administered 2021-01-03: 200 mg via INTRAVENOUS
  Filled 2021-01-03: qty 10

## 2021-01-03 NOTE — Patient Instructions (Signed)
[facility] [place] [facility]  Discharge Instructions: Thank you for choosing [facility] to provide your oncology and hematology care.  If you have a lab appointment with the Cancer Center, please go directly to the Cancer Center and check in at the registration area.  Wear comfortable clothing and clothing appropriate for easy access to any Portacath or PICC line.   We strive to give you quality time with your provider. You may need to reschedule your appointment if you arrive late (15 or more minutes).  Arriving late affects you and other patients whose appointments are after yours.  Also, if you miss three or more appointments without notifying the office, you may be dismissed from the clinic at the provider's discretion.      For prescription refill requests, have your pharmacy contact our office and allow 72 hours for refills to be completed.    Today you received the following : Venofer   To help prevent nausea and vomiting after your treatment, we encourage you to take your nausea medication as directed.  BELOW ARE SYMPTOMS THAT SHOULD BE REPORTED IMMEDIATELY: . *FEVER GREATER THAN 100.4 F (38 C) OR HIGHER . *CHILLS OR SWEATING . *NAUSEA AND VOMITING THAT IS NOT CONTROLLED WITH YOUR NAUSEA MEDICATION . *UNUSUAL SHORTNESS OF BREATH . *UNUSUAL BRUISING OR BLEEDING . *URINARY PROBLEMS (pain or burning when urinating, or frequent urination) . *BOWEL PROBLEMS (unusual diarrhea, constipation, pain near the anus) . TENDERNESS IN MOUTH AND THROAT WITH OR WITHOUT PRESENCE OF ULCERS (sore throat, sores in mouth, or a toothache) . UNUSUAL RASH, SWELLING OR PAIN  . UNUSUAL VAGINAL DISCHARGE OR ITCHING   Items with * indicate a potential emergency and should be followed up as soon as possible or go to the Emergency Department if any problems should occur.  Please show the CHEMOTHERAPY ALERT CARD or IMMUNOTHERAPY ALERT CARD at check-in to the Emergency  Department and triage nurse.  Should you have questions after your visit or need to cancel or reschedule your appointment, please contact [facility] [place] [facility]  [phone] and follow the prompts.  Office hours are 8:00 a.m. to 4:30 p.m. Monday - Friday. Please note that voicemails left after 4:00 p.m. may not be returned until the following business day.  We are closed weekends and major holidays. You have access to a nurse at all times for urgent questions. Please call the main number to the clinic [phone] and follow the prompts.  For any non-urgent questions, you may also contact your provider using MyChart. We now offer e-Visits for anyone 18 and older to request care online for non-urgent symptoms. For details visit [email or web address].   Also download the MyChart app! Go to the app store, search "MyChart", open the app, select [facility], and log in with your MyChart username and password.  Due to Covid, a mask is required upon entering the hospital/clinic. If you do not have a mask, one will be given to you upon arrival. For doctor visits, patients may have 1 support person aged 18 or older with them. For treatment visits, patients cannot have anyone with them due to current Covid guidelines and our immunocompromised population.  

## 2021-01-07 ENCOUNTER — Inpatient Hospital Stay: Payer: Medicare HMO

## 2021-01-07 VITALS — BP 114/60 | HR 90 | Temp 97.0°F | Resp 18

## 2021-01-07 DIAGNOSIS — D72821 Monocytosis (symptomatic): Secondary | ICD-10-CM | POA: Diagnosis not present

## 2021-01-07 DIAGNOSIS — J449 Chronic obstructive pulmonary disease, unspecified: Secondary | ICD-10-CM | POA: Diagnosis not present

## 2021-01-07 DIAGNOSIS — I110 Hypertensive heart disease with heart failure: Secondary | ICD-10-CM | POA: Diagnosis not present

## 2021-01-07 DIAGNOSIS — D509 Iron deficiency anemia, unspecified: Secondary | ICD-10-CM | POA: Diagnosis not present

## 2021-01-07 DIAGNOSIS — D508 Other iron deficiency anemias: Secondary | ICD-10-CM

## 2021-01-07 DIAGNOSIS — E7800 Pure hypercholesterolemia, unspecified: Secondary | ICD-10-CM | POA: Diagnosis not present

## 2021-01-07 DIAGNOSIS — I509 Heart failure, unspecified: Secondary | ICD-10-CM | POA: Diagnosis not present

## 2021-01-07 DIAGNOSIS — G4730 Sleep apnea, unspecified: Secondary | ICD-10-CM | POA: Diagnosis not present

## 2021-01-07 DIAGNOSIS — E119 Type 2 diabetes mellitus without complications: Secondary | ICD-10-CM | POA: Diagnosis not present

## 2021-01-07 DIAGNOSIS — D75839 Thrombocytosis, unspecified: Secondary | ICD-10-CM | POA: Diagnosis not present

## 2021-01-07 MED ORDER — IRON SUCROSE 20 MG/ML IV SOLN
200.0000 mg | Freq: Once | INTRAVENOUS | Status: AC
  Administered 2021-01-07: 200 mg via INTRAVENOUS
  Filled 2021-01-07: qty 10

## 2021-01-07 MED ORDER — SODIUM CHLORIDE 0.9 % IV SOLN: 200.0000 mg | INTRAVENOUS | Status: DC

## 2021-01-07 MED ORDER — SODIUM CHLORIDE 0.9 % IV SOLN
Freq: Once | INTRAVENOUS | Status: AC
Start: 2021-01-07 — End: 2021-01-07
  Filled 2021-01-07: qty 250

## 2021-01-07 NOTE — Patient Instructions (Signed)

## 2021-01-09 ENCOUNTER — Inpatient Hospital Stay: Payer: Medicare HMO

## 2021-01-09 ENCOUNTER — Other Ambulatory Visit: Payer: Self-pay

## 2021-01-09 VITALS — BP 111/42 | HR 89 | Temp 97.4°F | Resp 16

## 2021-01-09 DIAGNOSIS — E119 Type 2 diabetes mellitus without complications: Secondary | ICD-10-CM | POA: Diagnosis not present

## 2021-01-09 DIAGNOSIS — D72821 Monocytosis (symptomatic): Secondary | ICD-10-CM | POA: Diagnosis not present

## 2021-01-09 DIAGNOSIS — E7800 Pure hypercholesterolemia, unspecified: Secondary | ICD-10-CM | POA: Diagnosis not present

## 2021-01-09 DIAGNOSIS — D509 Iron deficiency anemia, unspecified: Secondary | ICD-10-CM | POA: Diagnosis not present

## 2021-01-09 DIAGNOSIS — J449 Chronic obstructive pulmonary disease, unspecified: Secondary | ICD-10-CM | POA: Diagnosis not present

## 2021-01-09 DIAGNOSIS — D75839 Thrombocytosis, unspecified: Secondary | ICD-10-CM | POA: Diagnosis not present

## 2021-01-09 DIAGNOSIS — G4730 Sleep apnea, unspecified: Secondary | ICD-10-CM | POA: Diagnosis not present

## 2021-01-09 DIAGNOSIS — D508 Other iron deficiency anemias: Secondary | ICD-10-CM

## 2021-01-09 DIAGNOSIS — I110 Hypertensive heart disease with heart failure: Secondary | ICD-10-CM | POA: Diagnosis not present

## 2021-01-09 DIAGNOSIS — I509 Heart failure, unspecified: Secondary | ICD-10-CM | POA: Diagnosis not present

## 2021-01-09 MED ORDER — SODIUM CHLORIDE 0.9 % IV SOLN: 200.0000 mg | INTRAVENOUS | Status: DC

## 2021-01-09 MED ORDER — SODIUM CHLORIDE 0.9 % IV SOLN
Freq: Once | INTRAVENOUS | Status: AC
  Filled 2021-01-09: qty 250

## 2021-01-09 MED ORDER — IRON SUCROSE 20 MG/ML IV SOLN
200.0000 mg | Freq: Once | INTRAVENOUS | Status: AC
Start: 2021-01-09 — End: 2021-01-09
  Administered 2021-01-09: 200 mg via INTRAVENOUS
  Filled 2021-01-09: qty 10

## 2021-01-11 ENCOUNTER — Emergency Department: Payer: Medicare HMO

## 2021-01-11 ENCOUNTER — Emergency Department
Admission: EM | Admit: 2021-01-11 | Discharge: 2021-01-11 | Disposition: A | Discharge status: Home / Self Care | Payer: Medicare HMO | Attending: Emergency Medicine | Admitting: Emergency Medicine

## 2021-01-11 ENCOUNTER — Other Ambulatory Visit: Payer: Self-pay

## 2021-01-11 ENCOUNTER — Encounter: Payer: Self-pay | Admitting: Emergency Medicine

## 2021-01-11 DIAGNOSIS — Z87891 Personal history of nicotine dependence: Secondary | ICD-10-CM | POA: Insufficient documentation

## 2021-01-11 DIAGNOSIS — R0602 Shortness of breath: Secondary | ICD-10-CM | POA: Insufficient documentation

## 2021-01-11 DIAGNOSIS — R109 Unspecified abdominal pain: Secondary | ICD-10-CM | POA: Insufficient documentation

## 2021-01-11 DIAGNOSIS — K219 Gastro-esophageal reflux disease without esophagitis: Secondary | ICD-10-CM | POA: Insufficient documentation

## 2021-01-11 DIAGNOSIS — I509 Heart failure, unspecified: Secondary | ICD-10-CM | POA: Insufficient documentation

## 2021-01-11 DIAGNOSIS — R110 Nausea: Secondary | ICD-10-CM | POA: Diagnosis not present

## 2021-01-11 DIAGNOSIS — R197 Diarrhea, unspecified: Secondary | ICD-10-CM | POA: Diagnosis present

## 2021-01-11 DIAGNOSIS — I10 Essential (primary) hypertension: Secondary | ICD-10-CM | POA: Diagnosis not present

## 2021-01-11 DIAGNOSIS — D171 Benign lipomatous neoplasm of skin and subcutaneous tissue of trunk: Secondary | ICD-10-CM | POA: Diagnosis not present

## 2021-01-11 DIAGNOSIS — K5790 Diverticulosis of intestine, part unspecified, without perforation or abscess without bleeding: Secondary | ICD-10-CM | POA: Diagnosis not present

## 2021-01-11 DIAGNOSIS — J449 Chronic obstructive pulmonary disease, unspecified: Secondary | ICD-10-CM | POA: Insufficient documentation

## 2021-01-11 DIAGNOSIS — M47816 Spondylosis without myelopathy or radiculopathy, lumbar region: Secondary | ICD-10-CM | POA: Diagnosis not present

## 2021-01-11 DIAGNOSIS — Z853 Personal history of malignant neoplasm of breast: Secondary | ICD-10-CM | POA: Insufficient documentation

## 2021-01-11 DIAGNOSIS — Z794 Long term (current) use of insulin: Secondary | ICD-10-CM | POA: Diagnosis not present

## 2021-01-11 DIAGNOSIS — E119 Type 2 diabetes mellitus without complications: Secondary | ICD-10-CM | POA: Diagnosis not present

## 2021-01-11 DIAGNOSIS — Z7984 Long term (current) use of oral hypoglycemic drugs: Secondary | ICD-10-CM | POA: Insufficient documentation

## 2021-01-11 DIAGNOSIS — I110 Hypertensive heart disease with heart failure: Secondary | ICD-10-CM | POA: Diagnosis not present

## 2021-01-11 DIAGNOSIS — J45909 Unspecified asthma, uncomplicated: Secondary | ICD-10-CM | POA: Diagnosis not present

## 2021-01-11 DIAGNOSIS — N281 Cyst of kidney, acquired: Secondary | ICD-10-CM | POA: Diagnosis not present

## 2021-01-11 LAB — CBC
HCT: 38.1 % (ref 36.0–46.0)
Hemoglobin: 12 g/dL (ref 12.0–15.0)
MCH: 28.3 pg (ref 26.0–34.0)
MCHC: 31.5 g/dL (ref 30.0–36.0)
MCV: 89.9 fL (ref 80.0–100.0)
Platelets: 454 10*3/uL — ABNORMAL HIGH (ref 150–400)
RBC: 4.24 MIL/uL (ref 3.87–5.11)
RDW: 16.2 % — ABNORMAL HIGH (ref 11.5–15.5)
WBC: 17.3 10*3/uL — ABNORMAL HIGH (ref 4.0–10.5)
nRBC: 0 % (ref 0.0–0.2)

## 2021-01-11 LAB — COMPREHENSIVE METABOLIC PANEL
ALT: 13 U/L (ref 0–44)
AST: 22 U/L (ref 15–41)
Albumin: 3.8 g/dL (ref 3.5–5.0)
Alkaline Phosphatase: 63 U/L (ref 38–126)
Anion gap: 10 (ref 5–15)
BUN: 10 mg/dL (ref 8–23)
CO2: 27 mmol/L (ref 22–32)
Calcium: 9.7 mg/dL (ref 8.9–10.3)
Chloride: 98 mmol/L (ref 98–111)
Creatinine, Ser: 0.65 mg/dL (ref 0.44–1.00)
GFR, Estimated: >60 mL/min (ref >60)
Glucose, Bld: 247 mg/dL — ABNORMAL HIGH (ref 70–99)
Potassium: 4.2 mmol/L (ref 3.5–5.1)
Sodium: 135 mmol/L (ref 135–145)
Total Bilirubin: 0.5 mg/dL (ref 0.3–1.2)
Total Protein: 8.5 g/dL — ABNORMAL HIGH (ref 6.5–8.1)

## 2021-01-11 LAB — URINALYSIS, COMPLETE (UACMP) WITH MICROSCOPIC
Bacteria, UA: NONE SEEN
Bilirubin Urine: NEGATIVE
Glucose, UA: NEGATIVE mg/dL
Hgb urine dipstick: NEGATIVE
Ketones, ur: NEGATIVE mg/dL
Leukocytes,Ua: NEGATIVE
Nitrite: NEGATIVE
Protein, ur: NEGATIVE mg/dL
Specific Gravity, Urine: 1.017 (ref 1.005–1.030)
pH: 9 — ABNORMAL HIGH (ref 5.0–8.0)

## 2021-01-11 LAB — TROPONIN I (HIGH SENSITIVITY): Troponin I (High Sensitivity): 6 ng/L (ref <18)

## 2021-01-11 LAB — LIPASE, BLOOD: Lipase: 33 U/L (ref 11–51)

## 2021-01-11 NOTE — ED Notes (Signed)
Lab called and informed that the initial troponin has not resulted.  Lab verified that it has not been run off the initial blood work, and that it will be started currently.

## 2021-01-11 NOTE — ED Notes (Signed)
See triage note  Presents with family  States she developed some abd discomfort with diarrhea since yesterday  No fever   States sx's started after having infusion of iron  States she feeling better at present

## 2021-01-11 NOTE — ED Triage Notes (Signed)
Pt comes into the ED via POV c/o SHOB, diarrhea, and over all fatigue.  Pt states that she has CHF and cOPD but her [facility] has increased.  Pt received a iron infusion yesterday and then last night she started having diarrhea.  Pt denies any black in her stool anymore than normal. Pt currently has even and unlabored respirations.  Pt denies any abdominal pain, but states she has had some swelling.

## 2021-01-11 NOTE — ED Provider Notes (Signed)
[facility] Emergency Department Provider Note  Time seen: 5:56 PM  I have reviewed the triage vital signs and the nursing notes.   HISTORY  Chief Complaint Diarrhea and Shortness of Breath   HPI Jordan Chan is a 70 y.o. female with a past medical history of asthma, CHF, COPD, diabetes, gastric reflux, hypertension, hyperlipidemia, prior CVA, presents to the emergency department for diarrhea and shortness of breath.  According to the patient since very early this morning she awoke with nausea and diarrhea.  Patient states approximately 10 episodes of diarrhea this morning however the last of which occurred at 1 PM.  States she was very nauseated this morning and could not eat or drink due to nausea but never vomited.  Patient states chronic shortness of breath that felt her shortness of breath was somewhat worse this morning, states is now back to her normal/baseline level.  Patient does wear oxygen at night.  Patient denies any abdominal pain at any point.  Denies any discomfort currently.  No fever.   Past Medical History:  Diagnosis Date   Arthritis    Asthma    Breast cancer ([place]) 1998   right breast ca with mastectomy and chemotherapy and radiation   Bronchitis    CHF (congestive heart failure) ([place])    "with Morphine withdrawal"   COPD (chronic obstructive pulmonary disease) ([place])    Diabetes mellitus without complication ([place])    Diverticulitis    diverticulosis also   Dyspnea    Endometriosis    GERD (gastroesophageal reflux disease)    History of shingles 2000-2005   Hypercholesteremia    Hypertension    IBS (irritable bowel syndrome)    Low back pain    a. Implanted morphine/bupivicaine/clonidine pump.   Neuropathy    Orthopnea    Oxygen dependent    uses at night   Personal history of chemotherapy    Personal history of radiation therapy    Pneumonia    pneumonia 5-6 times, history of bronchitis also   Scoliosis    Sleep apnea     does not use cpap   Stroke ([place]) 2010   TIA, 10 years ago   Withdrawal from sedative drug ([place])    withdrawal from morphine when pump batteries died    Patient Active Problem List   Diagnosis Date Noted   Iron deficiency anemia 12/28/2020   Type 2 diabetes, controlled, with neuropathy ([place]) 07/20/2020   Insulin dependent type 2 diabetes mellitus ([place]) 07/20/2020   SOB (shortness of breath) 07/04/2020   History of pulmonary embolus (PE) 07/04/2020   Aortic atherosclerosis ([place]) 07/01/2020   SVT (supraventricular tachycardia) ([place])    Vitamin D deficiency 07/17/2019   Stress-induced cardiomyopathy 07/23/2017   History of non-ST elevation myocardial infarction (NSTEMI)    Presence of intrathecal pump 04/27/2017   Advanced care planning/counseling discussion 11/14/2016   Cervical scoliosis 05/20/2016   Hypertension associated with diabetes ([place]) 06/18/2015   B12 deficiency 03/19/2015   Sleep apnea 11/16/2014   Allergic rhinitis 11/16/2014   History of breast cancer 11/16/2014   GERD (gastroesophageal reflux disease) 11/16/2014   COPD, moderate ([place]) 11/16/2014   Obesity 11/16/2014   Depression, major, single episode, in partial remission ([place]) 11/16/2014   Insomnia 11/16/2014   Hyperlipidemia associated with type 2 diabetes mellitus ([place]) 11/16/2014   History of sarcoma 09/27/2013   Chronic pain syndrome 09/30/2012    Past Surgical History:  Procedure Laterality Date   [place]  BACK SURGERY     Tailbone removed following fracture   BREAST SURGERY Right    mastectomy   CARDIAC CATHETERIZATION     CATARACT EXTRACTION W/PHACO Right 05/19/2019   Procedure: CATARACT EXTRACTION PHACO AND INTRAOCULAR LENS PLACEMENT ([place]), RIGHT, DIABETIC;  Surgeon: Marchia Meiers, MD;  Location: [facility] ORS;  Service: Ophthalmology;  Laterality: Right;  Lot # [id number] H [place]: 00:43.8 CDE: 4.59   CATARACT EXTRACTION W/PHACO Left 06/16/2019   Procedure: CATARACT EXTRACTION PHACO  AND INTRAOCULAR LENS PLACEMENT ([place]) LEFT VISION BLUE DIABETIC;  Surgeon: Marchia Meiers, MD;  Location: [facility] ORS;  Service: Ophthalmology;  Laterality: Left;  Lot #[id number] H [place]: 00:46.9 CDE: 6.53   COCCYX REMOVAL     COLONOSCOPY WITH PROPOFOL N/A 01/01/2017   Procedure: COLONOSCOPY WITH PROPOFOL;  Surgeon: Jonathon Bellows, MD;  Location: [facility] ENDOSCOPY;  Service: Endoscopy;  Laterality: N/A;   ELBOW ARTHROSCOPY WITH TENDON RECONSTRUCTION     ESOPHAGOGASTRODUODENOSCOPY (EGD) WITH PROPOFOL N/A 01/01/2017   Procedure: ESOPHAGOGASTRODUODENOSCOPY (EGD) WITH PROPOFOL;  Surgeon: Jonathon Bellows, MD;  Location: [facility] ENDOSCOPY;  Service: Endoscopy;  Laterality: N/A;   INTRATHECAL PUMP IMPLANT     LEFT HEART CATH AND CORONARY ANGIOGRAPHY N/A 04/28/2017   Procedure: LEFT HEART CATH AND CORONARY ANGIOGRAPHY;  Surgeon: [name], MD;  Location: [facility];  Service: Cardiovascular;  Laterality: N/A;   MASTECTOMY Right 06/1997   morphine pump  2011   PLANTAR FASCIA RELEASE     TRIGGER FINGER RELEASE      Prior to Admission medications   Medication Sig Start Date End Date Taking? Authorizing Provider  Accu-Chek Softclix Lancets lancets TEST BLOOD SUGAR THREE TIMES DAILY 08/15/20   [name], Henrine Screws T, NP  albuterol (PROVENTIL HFA;VENTOLIN HFA) 108 (90 BASE) MCG/ACT inhaler Inhale 2 puffs into the lungs every 6 (six) hours as needed for wheezing or shortness of breath.     [provider]  Alcohol Swabs (B-D SINGLE USE SWABS REGULAR) PADS USE TWICE DAILY  WITH  SUGAR  CHECKS 04/04/20   [name], Jolene T, NP  Blood Glucose Monitoring Suppl (TRUE METRIX METER) w/Device KIT Use to check blood sugar 4 times a day 10/31/20   [name], Henrine Screws T, NP  cholecalciferol (VITAMIN D3) 25 MCG (1000 UNIT) tablet Take 1,000 Units by mouth daily.    [provider]  Cyanocobalamin 1000 MCG/ML KIT Inject 1,000 mcg as directed every 30 (thirty) days.    [provider]  cyclobenzaprine (FLEXERIL) 5  MG tablet Take 5 mg by mouth 2 (two) times daily as needed for muscle spasms. Takes very rarely    [provider]  diclofenac (VOLTAREN) 50 MG EC tablet TAKE 1 TABLET THREE TIMES DAILY 11/12/20   [name], Jolene T, NP  Dulaglutide (TRULICITY) 4.5 OM/7.6HM SOPN Inject 4.5 mg as directed once a week. 10/25/20   [name], Henrine Screws T, NP  folic acid (FOLVITE) 1 MG tablet Take 1 tablet (1 mg total) by mouth daily. 12/26/20   Sindy Guadeloupe, MD  furosemide (LASIX) 40 MG tablet Take 1 tablet (40 mg total) by mouth daily. 12/07/20 06/05/21  End, Harrell Gave, MD  gabapentin (NEURONTIN) 800 MG tablet Take 1 tablet (800 mg total) by mouth 3 (three) times daily. 10/25/20   [name], Jolene T, NP  glucose blood (TRUE METRIX BLOOD GLUCOSE TEST) test strip To check blood sugar 3 times a day. 07/26/20   [name], Henrine Screws T, NP  insulin degludec (TRESIBA FLEXTOUCH) 100 UNIT/ML FlexTouch Pen Inject 40 Units into the skin daily.  [provider]  lactulose (CHRONULAC) 10 GM/15ML solution Take 45 mLs (30 g total) by mouth daily as needed for mild constipation or severe constipation. 12/20/19   Loletha Grayer, MD  lisinopril (ZESTRIL) 2.5 MG tablet Take 1 tablet (2.5 mg total) by mouth daily. 11/15/20 05/14/21  Loel Dubonnet, NP  metFORMIN (GLUCOPHAGE) 1000 MG tablet Take 1 tablet (1,000 mg total) by mouth 2 (two) times daily with a meal. 10/25/20   [name], Jolene T, NP  metoprolol succinate (TOPROL-XL) 25 MG 24 hr tablet Take 1 tablet (25 mg total) by mouth daily. 01/02/21 04/02/21  End, Harrell Gave, MD  montelukast (SINGULAIR) 10 MG tablet Take 10 mg by mouth at bedtime.    [provider]  PAIN MANAGEMENT INTRATHECAL, IT, PUMP 1 each by Intrathecal route. Intrathecal (IT) medication:  Morphine Patient does not remember current. Adjusted every 2.5 months at [facility] in [place].    [provider]  TRELEGY ELLIPTA 100-62.5-25 MCG/INH AEPB INHALE 1 PUFF INTO THE LUNGS  DAILY. 12/03/20   [name], Henrine Screws T, NP  venlafaxine XR (EFFEXOR-XR) 150 MG 24 hr capsule Take 1 capsule (150 mg total) by mouth daily. 10/22/20   [name] T, NP    Allergies  Allergen Reactions   Other Palpitations    IV steroids   Pain Patch [Menthol] Anaphylaxis   Fentanyl    Avelox [Moxifloxacin Hcl In Nacl] Other (See Comments)    Upset stomach   Doxycycline Diarrhea   Erythromycin Nausea Only and Other (See Comments)    Can take a Z-Pak just fine Other reaction(s): Other (See Comments) Can take Z-Pak  Can take a Z-Pak just fine   Moxifloxacin Hcl Other (See Comments)    Upset stomach Upset stomach   Oxycontin [Oxycodone] Hives   Ozempic [Semaglutide] Nausea Only    Family History  Problem Relation Age of Onset   Cancer Mother 63       lung   Thyroid disease Mother    Cancer Father 25       Pancreatic   Hypertension Sister    Cancer Sister        "cancer on face"   Hyperlipidemia Sister    Osteoporosis Maternal Grandmother    Hyperlipidemia Son    Seizures Son    Cancer Paternal Grandmother        colon   Arthritis Paternal Grandfather    Breast cancer Neg Hx     Social History Social History   Tobacco Use   Smoking status: Former    Packs/day: 1.00    Years: 30.00    Pack years: 30.00    Types: Cigarettes    Quit date: 04/30/2004    Years since quitting: 16.7   Smokeless tobacco: Never  Vaping Use   Vaping Use: Never used  Substance Use Topics   Alcohol use: No   Drug use: No    Review of Systems Constitutional: Negative for fever. Cardiovascular: Negative for chest pain. Respiratory: Negative for shortness of breath. Gastrointestinal: Negative for abdominal pain.  Positive for nausea and diarrhea. Musculoskeletal: Negative for musculoskeletal complaints Neurological: Negative for headache All other ROS negative  ____________________________________________   PHYSICAL EXAM:  VITAL SIGNS: ED Triage Vitals [01/11/21 1418]  Enc  Vitals Group     BP 140/68     Pulse Rate (!) 107     Resp 20     Temp 98.2 F (36.8 C)     Temp Source Oral  SpO2 97 %     Weight 158 lb 11.7 oz (72 kg)     Height 4' 10.5" (1.486 m)     Head Circumference      Peak Flow      Pain Score 6     Pain Loc      Pain Edu?      Excl. in [place]?    Constitutional: Alert and oriented. Well appearing and in no distress. Eyes: Normal exam ENT      Head: Normocephalic and atraumatic.      Mouth/Throat: Mucous membranes are moist. Cardiovascular: Normal rate, regular rhythm.  Respiratory: Normal respiratory effort without tachypnea nor retractions. Breath sounds are clear  Gastrointestinal: Slight left lower quadrant tenderness to palpation.  No rebound guarding or distention. Musculoskeletal: Nontender with normal range of motion in all extremities.  Neurologic:  Normal speech and language. No gross focal neurologic deficits  Skin:  Skin is warm, dry and intact.  Psychiatric: Mood and affect are normal.   ____________________________________________    EKG  EKG viewed and interpreted by myself shows a normal sinus rhythm at 99 bpm with a narrow QRS, normal axis, normal intervals, nonspecific ST changes without ST elevation.  ____________________________________________    RADIOLOGY  Chest x-ray shows chronic changes but no acute findings. CT negative for acute abnormality.  ____________________________________________   INITIAL IMPRESSION / ASSESSMENT AND PLAN / ED COURSE  Pertinent labs & imaging results that were available during my care of the patient were reviewed by me and considered in my medical decision making (see chart for details).   Patient presents to the emergency department for nausea and multiple episodes of diarrhea this morning.  Patient states her last episode of diarrhea was around 1 PM.  States she is feeling better.  Patient has a reassuring physical exam, very slight left lower quadrant tenderness.  With  the diarrhea we will obtain a CT scan as a precaution rule out diverticulitis or colitis.  Patient does have moderate leukocytosis of 17,000 however comparing to historical values it is minimally elevated compared to her baseline.   CT scan is negative for acute abnormality.  We will discharge patient home with nausea medication be used if needed.  Patient states she is feeling better.  Patient to follow-up with her PCP.  Discussed return precautions.  Jordan Chan was evaluated in Emergency Department on 01/11/2021 for the symptoms described in the history of present illness. She was evaluated in the context of the global COVID-19 pandemic, which necessitated consideration that the patient might be at risk for infection with the SARS-CoV-2 virus that causes COVID-19. Institutional protocols and algorithms that pertain to the evaluation of patients at risk for COVID-19 are in a state of rapid change based on information released by regulatory bodies including the CDC and federal and state organizations. These policies and algorithms were followed during the patient's care in the ED.  ____________________________________________   FINAL CLINICAL IMPRESSION(S) / ED DIAGNOSES  Diarrhea Nausea   Harvest Dark, MD 01/11/21 8938

## 2021-01-11 NOTE — Discharge Instructions (Addendum)
Please use your nausea medication as needed, as prescribed.  Please adhere to a bland diet for the next 24 hours.  Please drink plenty of fluids.  Return to the emergency department for any abdominal pain, fever, or any other symptom personally concerning to yourself.  Otherwise please follow-up with your doctor in 2 days for recheck/reevaluation.

## 2021-01-16 ENCOUNTER — Other Ambulatory Visit: Payer: Self-pay | Admitting: Nurse Practitioner

## 2021-01-16 NOTE — Telephone Encounter (Signed)
Requested Prescriptions  Pending Prescriptions Disp Refills  . Alcohol Swabs (B-D SINGLE USE SWABS REGULAR) PADS [Pharmacy Med Name: BD SWABS SINGLE USE   Pad] 200 each 2    Sig: USE TWICE DAILY  WITH  SUGAR  CHECKS     Off-Protocol Failed - 01/16/2021  7:43 PM      Failed - Medication not assigned to a protocol, review manually.      Passed - Valid encounter within last 12 months    Recent Outpatient Visits          2 months ago Insulin dependent type 2 diabetes mellitus ([place])   [place] Ganister, Jolene T, NP   5 months ago Type 2 diabetes, controlled, with neuropathy ([place])   [place], Chaumont T, NP   9 months ago Suspected COVID-19 virus infection   [employer], Megan P, DO   9 months ago Type 2 diabetes mellitus with diabetic neuropathy, with long-term current use of insulin ([place])   [place], Jolene T, NP   12 months ago Acute pulmonary embolism without acute cor pulmonale, unspecified pulmonary embolism type ([place])   [place], Henrine Screws T, NP      Future Appointments            In 1 week [name], NP [facility]   In 4 weeks Dunn, Ryan M, PA-C [facility], [facility]   In 11 months  [facility]           . diclofenac (VOLTAREN) 50 MG EC tablet [Pharmacy Med Name: DICLOFENAC SODIUM DR 50 MG Tablet Delayed Release] 270 tablet 0    Sig: TAKE 1 TABLET THREE TIMES DAILY     Analgesics:  NSAIDS Passed - 01/16/2021  7:43 PM      Passed - Cr in normal range and within 360 days    Creatinine  Date Value Ref Range Status  09/14/2013 0.68 0.60 - 1.30 mg/dL Final   Creatinine, Ser  Date Value Ref Range Status  01/11/2021 0.65 0.44 - 1.00 mg/dL Final         Passed - HGB in normal range and within 360 days    Hemoglobin  Date Value Ref Range Status  01/11/2021 12.0 12.0 - 15.0 g/dL Final  12/12/2020 11.5 11.1 -  15.9 g/dL Final         Passed - Patient is not pregnant      Passed - Valid encounter within last 12 months    Recent Outpatient Visits          2 months ago Insulin dependent type 2 diabetes mellitus ([place])   [place], Jolene T, NP   5 months ago Type 2 diabetes, controlled, with neuropathy ([place])   [place], Jolene T, NP   9 months ago Suspected COVID-19 virus infection   [employer], Megan P, DO   9 months ago Type 2 diabetes mellitus with diabetic neuropathy, with long-term current use of insulin ([place])   [place], Jolene T, NP   12 months ago Acute pulmonary embolism without acute cor pulmonale, unspecified pulmonary embolism type ([place])   [place], [name], NP      Future Appointments            In 1 week [name], [name], NP [facility]   In 4 weeks [employer],  Areta Haber, PA-C [place], [facility]   In 11 months  [facility]

## 2021-01-17 ENCOUNTER — Telehealth: Payer: Self-pay | Admitting: Pharmacist

## 2021-01-17 NOTE — Chronic Care Management (AMB) (Signed)
Chronic Care Management Pharmacy Assistant   Name: Jordan Chan  MRN: [id number] DOB: 1951-01-23   Reason for Encounter: Disease State Diabetes Mellitus    Recent office visits:  None noted  Recent consult visits:  01/02/21-Christopher End, MD (Cardiology) Follow up. Increase metoprolol succinate to 25 mg daily and HOLD Atorvastatin until next follow up appointment. Follow up in 6 weeks. 12/21/20-Archana Rao, MD (Oncology) Initial visit. Labs ordered. Follow up in 3-4 weeks. Hospital visits:  Medication Reconciliation was completed by comparing discharge summary, patient's EMR and Pharmacy list, and upon discussion with patient.  Admitted to the hospital on 01/11/21 due to Diarrhea. Discharge date was 01/11/21. Discharged from [place]?Medications Started at [facility] Discharge:?? -started None noted  Medication Changes at Hospital Discharge: -Changed None noted  Medications Discontinued at Hospital Discharge: -Stopped None noted  Medications that remain the same after Hospital Discharge:??  -All other medications will remain the same.     Medication Reconciliation was completed by comparing discharge summary, patient's EMR and Pharmacy list, and upon discussion with patient.  Admitted to the hospital on 12/07/20 due to Shortness of breath. Discharge date was 12/04/20. Discharged from [place]?Medications Started at [facility] Discharge:?? -started None noted  Medication Changes at Hospital Discharge: -Changed None noted  Medications Discontinued at Hospital Discharge: -Stopped None noted  Medications that remain the same after Hospital Discharge:??  -All other medications will remain the same.    Medications: Outpatient Encounter Medications as of 01/17/2021  Medication Sig   Accu-Chek Softclix Lancets lancets TEST BLOOD SUGAR THREE TIMES DAILY   albuterol (PROVENTIL HFA;VENTOLIN HFA) 108 (90 BASE) MCG/ACT  inhaler Inhale 2 puffs into the lungs every 6 (six) hours as needed for wheezing or shortness of breath.    Alcohol Swabs (B-D SINGLE USE SWABS REGULAR) PADS USE TWICE DAILY  WITH  SUGAR  CHECKS   Blood Glucose Monitoring Suppl (TRUE METRIX METER) w/Device KIT Use to check blood sugar 4 times a day   cholecalciferol (VITAMIN D3) 25 MCG (1000 UNIT) tablet Take 1,000 Units by mouth daily.   Cyanocobalamin 1000 MCG/ML KIT Inject 1,000 mcg as directed every 30 (thirty) days.   cyclobenzaprine (FLEXERIL) 5 MG tablet Take 5 mg by mouth 2 (two) times daily as needed for muscle spasms. Takes very rarely   diclofenac (VOLTAREN) 50 MG EC tablet TAKE 1 TABLET THREE TIMES DAILY   Dulaglutide (TRULICITY) 4.5 OH/6.0VP SOPN Inject 4.5 mg as directed once a week.   folic acid (FOLVITE) 1 MG tablet Take 1 tablet (1 mg total) by mouth daily.   furosemide (LASIX) 40 MG tablet Take 1 tablet (40 mg total) by mouth daily.   gabapentin (NEURONTIN) 800 MG tablet Take 1 tablet (800 mg total) by mouth 3 (three) times daily.   glucose blood (TRUE METRIX BLOOD GLUCOSE TEST) test strip To check blood sugar 3 times a day.   insulin degludec (TRESIBA FLEXTOUCH) 100 UNIT/ML FlexTouch Pen Inject 40 Units into the skin daily.   lactulose (CHRONULAC) 10 GM/15ML solution Take 45 mLs (30 g total) by mouth daily as needed for mild constipation or severe constipation.   lisinopril (ZESTRIL) 2.5 MG tablet Take 1 tablet (2.5 mg total) by mouth daily.   metFORMIN (GLUCOPHAGE) 1000 MG tablet Take 1 tablet (1,000 mg total) by mouth 2 (two) times daily with a meal.   metoprolol succinate (TOPROL-XL) 25 MG 24 hr tablet Take 1 tablet (25 mg total)  by mouth daily.   montelukast (SINGULAIR) 10 MG tablet Take 10 mg by mouth at bedtime.   PAIN MANAGEMENT INTRATHECAL, IT, PUMP 1 each by Intrathecal route. Intrathecal (IT) medication:  Morphine Patient does not remember current. Adjusted every 2.5 months at [facility] in [place].    TRELEGY ELLIPTA 100-62.5-25 MCG/INH AEPB INHALE 1 PUFF INTO THE LUNGS DAILY.   venlafaxine XR (EFFEXOR-XR) 150 MG 24 hr capsule Take 1 capsule (150 mg total) by mouth daily.   No facility-administered encounter medications on file as of 01/17/2021.   Recent Relevant Labs: Lab Results  Component Value Date/Time   HGBA1C 8.7 (H) 10/25/2020 08:43 AM   HGBA1C 7.1 (H) 07/26/2020 09:00 AM   HGBA1C 7.7% 04/28/2016 12:00 AM   MICROALBUR 10 10/25/2020 08:43 AM   MICROALBUR 10 11/15/2019 09:38 AM    Kidney Function Lab Results  Component Value Date/Time   CREATININE 0.65 01/11/2021 02:29 PM   CREATININE 0.89 12/07/2020 10:36 AM   CREATININE 0.68 09/14/2013 07:42 PM   GFRNONAA >60 01/11/2021 02:29 PM   GFRNONAA >60 09/14/2013 07:42 PM   GFRAA 92 07/26/2020 09:02 AM   GFRAA >60 09/14/2013 07:42 PM    Current antihyperglycemic regimen:  Tyler Aas flextouch 100 unit inject 40 units into the skin daily Trulicity 8.0KL/4.9ZP inject 4.5 mg once a week Metformin 1000 mg take 1 tab twice daily with meal   Adherence Review: Is the patient currently on a STATIN medication? No Is the patient currently on ACE/ARB medication? Yes Does the patient have >5 day gap between last estimated fill dates? No  Unable to reach patient for her Disease state call for her Diabetes Mellitus. I have left voicemail's for the patient to return phone call.  Star Rating Drugs: Tresiba 100 unit Last filled:11/23/20 90 DS Lisinopril 2.23m Last fHXTAVW:97/94/8090 DS Trulicity 4.5 mg Last fXKPVVZ:48/27/0784 DS Metformin 1000 mg Last filled:12/05/20 90 DS

## 2021-01-18 ENCOUNTER — Encounter: Payer: Self-pay | Admitting: Nurse Practitioner

## 2021-01-18 DIAGNOSIS — I5022 Chronic systolic (congestive) heart failure: Secondary | ICD-10-CM | POA: Insufficient documentation

## 2021-01-21 DIAGNOSIS — J449 Chronic obstructive pulmonary disease, unspecified: Secondary | ICD-10-CM | POA: Diagnosis not present

## 2021-01-21 DIAGNOSIS — R0600 Dyspnea, unspecified: Secondary | ICD-10-CM | POA: Diagnosis not present

## 2021-01-22 ENCOUNTER — Inpatient Hospital Stay (HOSPITAL_BASED_OUTPATIENT_CLINIC_OR_DEPARTMENT_OTHER): Payer: Medicare HMO | Admitting: Oncology

## 2021-01-22 ENCOUNTER — Encounter: Payer: Self-pay | Admitting: Oncology

## 2021-01-22 VITALS — BP 109/66 | HR 83 | Temp 98.4°F | Resp 16 | Ht <= 58 in | Wt 159.6 lb

## 2021-01-22 DIAGNOSIS — D72821 Monocytosis (symptomatic): Secondary | ICD-10-CM | POA: Diagnosis not present

## 2021-01-22 DIAGNOSIS — D75839 Thrombocytosis, unspecified: Secondary | ICD-10-CM

## 2021-01-22 DIAGNOSIS — J449 Chronic obstructive pulmonary disease, unspecified: Secondary | ICD-10-CM | POA: Diagnosis not present

## 2021-01-22 DIAGNOSIS — D509 Iron deficiency anemia, unspecified: Secondary | ICD-10-CM | POA: Diagnosis not present

## 2021-01-22 DIAGNOSIS — E119 Type 2 diabetes mellitus without complications: Secondary | ICD-10-CM | POA: Diagnosis not present

## 2021-01-22 DIAGNOSIS — I110 Hypertensive heart disease with heart failure: Secondary | ICD-10-CM | POA: Diagnosis not present

## 2021-01-22 DIAGNOSIS — E7800 Pure hypercholesterolemia, unspecified: Secondary | ICD-10-CM | POA: Diagnosis not present

## 2021-01-22 DIAGNOSIS — G4730 Sleep apnea, unspecified: Secondary | ICD-10-CM | POA: Diagnosis not present

## 2021-01-22 DIAGNOSIS — D729 Disorder of white blood cells, unspecified: Secondary | ICD-10-CM

## 2021-01-22 DIAGNOSIS — I509 Heart failure, unspecified: Secondary | ICD-10-CM | POA: Diagnosis not present

## 2021-01-22 DIAGNOSIS — D72828 Other elevated white blood cell count: Secondary | ICD-10-CM

## 2021-01-22 NOTE — Progress Notes (Signed)
Hematology/Oncology Consult note [facility]  [phone] [phone]  Patient Care Team: [name], NP as PCP - General (Nurse Practitioner) End, Harrell Gave, MD as PCP - Cardiology (Cardiology) Erby Pian, MD as Referring Physician (Pulmonary Disease) Kandace Blitz, MD as Referring Physician (Pain Medicine) Vladimir Faster, [facility] as Pharmacist (Pharmacist) Rebekah Chesterfield, LCSW as Social Worker (Licensed Clinical Social Worker)   Name of the patient: Jordan Chan  [id number]  Mar 07, 1951   Date of visit: 01/22/21  Diagnosis-leukocytosis mainly mild monocytosis and thrombocytosis of unclear etiology  Chief complaint/ Reason for visit-routine follow-up of leukocytosis/thrombocytosis  Heme/Onc history: patient is a 70 year old female with a past medical history significant for COPD GERD insulin-dependent diabetes history of breast cancer in the remote past who has been referred to [place] for leukocytosis and thrombocytosis.Looking back at her CBC her white cell count fluctuates between 10-16 over the last 7 years.  More recently since the last 3 months her white count has been between 11-13.  There are times when her white count has gone up to 34 and patient thinks that she may have been on steroids.  Differential has mainly showed neutrophilia but also with some monocytosis and mild eosinophilia.  Patient also noted to have a platelet count mainly in the high 400s at least since 2015 with no clear rising trend.  Hemoglobin more recently has been close to 11  Results of blood work from 12/21/2020 were as follows: CBC showed white cell count of 13, H&H of 11.7/37.2 with an MCV of 89 and platelet count of 446.  Iron studies did show evidence of iron deficiency with elevated TIBC of 475, low iron saturation of 8%And low ferritin of 5 patient received 5 doses of Venofer  BCR ABL FISH testing, JAK2 mutation analysis, flow cytometry  wasUnremarkable.  Interval history-patient has some baseline fatigue And exertional shortness of breath.  She also follows up with cardiology.  ECOG PS- 1 Pain scale- 0  Review of systems- Review of Systems  Constitutional:  Positive for malaise/fatigue. Negative for chills, fever and weight loss.  HENT:  Negative for congestion, ear discharge and nosebleeds.   Eyes:  Negative for blurred vision.  Respiratory:  Positive for shortness of breath. Negative for cough, hemoptysis, sputum production and wheezing.   Cardiovascular:  Negative for chest pain, palpitations, orthopnea and claudication.  Gastrointestinal:  Negative for abdominal pain, blood in stool, constipation, diarrhea, heartburn, melena, nausea and vomiting.  Genitourinary:  Negative for dysuria, flank pain, frequency, hematuria and urgency.  Musculoskeletal:  Negative for back pain, joint pain and myalgias.  Skin:  Negative for rash.  Neurological:  Negative for dizziness, tingling, focal weakness, seizures, weakness and headaches.  Endo/Heme/Allergies:  Does not bruise/bleed easily.  Psychiatric/Behavioral:  Negative for depression and suicidal ideas. The patient does not have insomnia.      Allergies  Allergen Reactions   Other Palpitations    IV steroids   Pain Patch [Menthol] Anaphylaxis   Fentanyl    Avelox [Moxifloxacin Hcl In Nacl] Other (See Comments)    Upset stomach   Doxycycline Diarrhea   Erythromycin Nausea Only and Other (See Comments)    Can take a Z-Pak just fine Other reaction(s): Other (See Comments) Can take Z-Pak  Can take a Z-Pak just fine   Moxifloxacin Hcl Other (See Comments)    Upset stomach Upset stomach   Oxycontin [Oxycodone] Hives   Ozempic [Semaglutide] Nausea Only     Past  Medical History:  Diagnosis Date   Arthritis    Asthma    Breast cancer ([place]) 1998   right breast ca with mastectomy and chemotherapy and radiation   Bronchitis    CHF (congestive heart failure) ([place])     "with Morphine withdrawal"   COPD (chronic obstructive pulmonary disease) ([place])    Diabetes mellitus without complication ([place])    Diverticulitis    diverticulosis also   Dyspnea    Endometriosis    GERD (gastroesophageal reflux disease)    History of shingles 2000-2005   Hypercholesteremia    Hypertension    IBS (irritable bowel syndrome)    Low back pain    a. Implanted morphine/bupivicaine/clonidine pump.   Neuropathy    Orthopnea    Oxygen dependent    uses at night   Personal history of chemotherapy    Personal history of radiation therapy    Pneumonia    pneumonia 5-6 times, history of bronchitis also   Scoliosis    Sleep apnea    does not use cpap   Stroke ([place]) 2010   TIA, 10 years ago   Withdrawal from sedative drug ([place])    withdrawal from morphine when pump batteries died     Past Surgical History:  Procedure Laterality Date   ABDOMINAL HYSTERECTOMY  1987   BACK SURGERY     Tailbone removed following fracture   BREAST SURGERY Right    mastectomy   CARDIAC CATHETERIZATION     CATARACT EXTRACTION W/PHACO Right 05/19/2019   Procedure: CATARACT EXTRACTION PHACO AND INTRAOCULAR LENS PLACEMENT ([place]), RIGHT, DIABETIC;  Surgeon: Marchia Meiers, MD;  Location: [facility] ORS;  Service: Ophthalmology;  Laterality: Right;  Lot # [id number] H [place]: 00:43.8 CDE: 4.59   CATARACT EXTRACTION W/PHACO Left 06/16/2019   Procedure: CATARACT EXTRACTION PHACO AND INTRAOCULAR LENS PLACEMENT ([place]) LEFT VISION BLUE DIABETIC;  Surgeon: Marchia Meiers, MD;  Location: [facility] ORS;  Service: Ophthalmology;  Laterality: Left;  Lot #[id number] H [place]: 00:46.9 CDE: 6.53   COCCYX REMOVAL     COLONOSCOPY WITH PROPOFOL N/A 01/01/2017   Procedure: COLONOSCOPY WITH PROPOFOL;  Surgeon: Jonathon Bellows, MD;  Location: [facility] ENDOSCOPY;  Service: Endoscopy;  Laterality: N/A;   ELBOW ARTHROSCOPY WITH TENDON RECONSTRUCTION     ESOPHAGOGASTRODUODENOSCOPY (EGD) WITH PROPOFOL N/A 01/01/2017   Procedure:  ESOPHAGOGASTRODUODENOSCOPY (EGD) WITH PROPOFOL;  Surgeon: Jonathon Bellows, MD;  Location: [facility] ENDOSCOPY;  Service: Endoscopy;  Laterality: N/A;   INTRATHECAL PUMP IMPLANT     LEFT HEART CATH AND CORONARY ANGIOGRAPHY N/A 04/28/2017   Procedure: LEFT HEART CATH AND CORONARY ANGIOGRAPHY;  Surgeon: [name], MD;  Location: [facility];  Service: Cardiovascular;  Laterality: N/A;   MASTECTOMY Right 06/1997   morphine pump  2011   PLANTAR FASCIA RELEASE     TRIGGER FINGER RELEASE      Social History   Socioeconomic History   Marital status: Unknown    Spouse name: Not on file   Number of children: Not on file   Years of education: Not on file   Highest education level: High school graduate  Occupational History   Occupation: retired  Tobacco Use   Smoking status: Former    Packs/day: 1.00    Years: 30.00    Pack years: 30.00    Types: Cigarettes    Quit date: 04/30/2004    Years since quitting: 16.7   Smokeless tobacco: Never  Vaping Use   Vaping Use: Never used  Substance and Sexual Activity   Alcohol  use: No   Drug use: No   Sexual activity: Not Currently    Birth control/protection: Post-menopausal  Other Topics Concern   Not on file  Social History Narrative   ** Merged History Encounter **       Social Determinants of Health   Financial Resource Strain: Low Risk    Difficulty of Paying Living Expenses: Not hard at all  Food Insecurity: No Food Insecurity   Worried About Charity fundraiser in the Last Year: Never true   [place] in the Last Year: Never true  Transportation Needs: No Transportation Needs   Lack of Transportation (Medical): No   Lack of Transportation (Non-Medical): No  Physical Activity: Inactive   Days of Exercise per Week: 0 days   Minutes of Exercise per Session: 0 min  Stress: No Stress Concern Present   Feeling of Stress : Not at all  Social Connections: Not on file  Intimate Partner Violence: Not on file    Family  History  Problem Relation Age of Onset   Cancer Mother 69       lung   Thyroid disease Mother    Cancer Father 57       Pancreatic   Hypertension Sister    Cancer Sister        "cancer on face"   Hyperlipidemia Sister    Osteoporosis Maternal Grandmother    Hyperlipidemia Son    Seizures Son    Cancer Paternal Grandmother        colon   Arthritis Paternal Grandfather    Breast cancer Neg Hx      Current Outpatient Medications:    Accu-Chek Softclix Lancets lancets, TEST BLOOD SUGAR THREE TIMES DAILY, Disp: 300 each, Rfl: 1   albuterol (PROVENTIL HFA;VENTOLIN HFA) 108 (90 BASE) MCG/ACT inhaler, Inhale 2 puffs into the lungs every 6 (six) hours as needed for wheezing or shortness of breath. , Disp: , Rfl:    Alcohol Swabs (B-D SINGLE USE SWABS REGULAR) PADS, USE TWICE DAILY  WITH  SUGAR  CHECKS, Disp: 200 each, Rfl: 2   Blood Glucose Monitoring Suppl (TRUE METRIX METER) w/Device KIT, Use to check blood sugar 4 times a day, Disp: 1 kit, Rfl: 4   cholecalciferol (VITAMIN D3) 25 MCG (1000 UNIT) tablet, Take 1,000 Units by mouth daily., Disp: , Rfl:    Cyanocobalamin 1000 MCG/ML KIT, Inject 1,000 mcg as directed every 30 (thirty) days., Disp: , Rfl:    cyclobenzaprine (FLEXERIL) 5 MG tablet, Take 5 mg by mouth 2 (two) times daily as needed for muscle spasms. Takes very rarely, Disp: , Rfl:    diclofenac (VOLTAREN) 50 MG EC tablet, TAKE 1 TABLET THREE TIMES DAILY, Disp: 270 tablet, Rfl: 0   Dulaglutide (TRULICITY) 4.5 ZO/1.0RU SOPN, Inject 4.5 mg as directed once a week., Disp: 6 mL, Rfl: 4   folic acid (FOLVITE) 1 MG tablet, Take 1 tablet (1 mg total) by mouth daily., Disp: 30 tablet, Rfl: 1   furosemide (LASIX) 40 MG tablet, Take 1 tablet (40 mg total) by mouth daily., Disp: 90 tablet, Rfl: 0   gabapentin (NEURONTIN) 800 MG tablet, Take 1 tablet (800 mg total) by mouth 3 (three) times daily., Disp: 270 tablet, Rfl: 4   glucose blood (TRUE METRIX BLOOD GLUCOSE TEST) test strip, To check  blood sugar 3 times a day., Disp: 300 each, Rfl: 4   insulin degludec (TRESIBA FLEXTOUCH) 100 UNIT/ML FlexTouch Pen, Inject 40 Units into the skin  daily., Disp: , Rfl:    lactulose (CHRONULAC) 10 GM/15ML solution, Take 45 mLs (30 g total) by mouth daily as needed for mild constipation or severe constipation., Disp: 1892 mL, Rfl: 0   lisinopril (ZESTRIL) 2.5 MG tablet, Take 1 tablet (2.5 mg total) by mouth daily., Disp: 90 tablet, Rfl: 1   metFORMIN (GLUCOPHAGE) 1000 MG tablet, Take 1 tablet (1,000 mg total) by mouth 2 (two) times daily with a meal., Disp: 180 tablet, Rfl: 4   metoprolol succinate (TOPROL-XL) 25 MG 24 hr tablet, Take 1 tablet (25 mg total) by mouth daily., Disp: 90 tablet, Rfl: 0   montelukast (SINGULAIR) 10 MG tablet, Take 10 mg by mouth at bedtime., Disp: , Rfl:    PAIN MANAGEMENT INTRATHECAL, IT, PUMP, 1 each by Intrathecal route. Intrathecal (IT) medication:  Morphine Patient does not remember current. Adjusted every 2.5 months at [facility] in [place]., Disp: , Rfl:    TRELEGY ELLIPTA 100-62.5-25 MCG/INH AEPB, INHALE 1 PUFF INTO THE LUNGS DAILY., Disp: 180 each, Rfl: 4   venlafaxine XR (EFFEXOR-XR) 150 MG 24 hr capsule, Take 1 capsule (150 mg total) by mouth daily., Disp: 90 capsule, Rfl: 4  Physical exam:  Vitals:   01/22/21 1130  BP: 109/66  Pulse: 83  Resp: 16  Temp: 98.4 F (36.9 C)  TempSrc: Tympanic  Weight: 159 lb 9.6 oz (72.4 kg)  Height: 4' 10" (1.473 m)   Physical Exam Constitutional:      General: She is not in acute distress. Cardiovascular:     Rate and Rhythm: Normal rate and regular rhythm.     Heart sounds: Normal heart sounds.  Pulmonary:     Effort: Pulmonary effort is normal.     Breath sounds: Normal breath sounds.  Abdominal:     General: Bowel sounds are normal.     Palpations: Abdomen is soft.  Musculoskeletal:     Cervical back: Normal range of motion.     Right lower leg: No edema.     Left lower leg: No edema.   Skin:    General: Skin is warm and dry.  Neurological:     Mental Status: She is alert and oriented to person, place, and time.     CMP Latest Ref Rng & Units 01/11/2021  Glucose 70 - 99 mg/dL 247(H)  BUN 8 - 23 mg/dL 10  Creatinine 0.44 - 1.00 mg/dL 0.65  Sodium 135 - 145 mmol/L 135  Potassium 3.5 - 5.1 mmol/L 4.2  Chloride 98 - 111 mmol/L 98  CO2 22 - 32 mmol/L 27  Calcium 8.9 - 10.3 mg/dL 9.7  Total Protein 6.5 - 8.1 g/dL 8.5(H)  Total Bilirubin 0.3 - 1.2 mg/dL 0.5  Alkaline Phos 38 - 126 U/L 63  AST 15 - 41 U/L 22  ALT 0 - 44 U/L 13   CBC Latest Ref Rng & Units 01/11/2021  WBC 4.0 - 10.5 K/uL 17.3(H)  Hemoglobin 12.0 - 15.0 g/dL 12.0  Hematocrit 36.0 - 46.0 % 38.1  Platelets 150 - 400 K/uL 454(H)    No images are attached to the encounter.  CT ABDOMEN PELVIS WO CONTRAST  Result Date: 01/11/2021 CLINICAL DATA:  Abdominal pain and discomfort EXAM: CT ABDOMEN AND PELVIS WITHOUT CONTRAST TECHNIQUE: Multidetector CT imaging of the abdomen and pelvis was performed following the standard protocol without IV contrast. COMPARISON:  12/11/2019 FINDINGS: Lower chest: No acute abnormality. Changes of right mastectomy are again noted and stable. Hepatobiliary: No focal liver abnormality  is seen. No gallstones, gallbladder wall thickening, or biliary dilatation. Pancreas: Unremarkable. No pancreatic ductal dilatation or surrounding inflammatory changes. Spleen: Normal in size without focal abnormality. Adrenals/Urinary Tract: Adrenal glands are within normal limits. Kidneys are well visualized bilaterally. No renal calculi or obstructive changes are noted. Multiple renal cysts are seen stable in appearance from the prior exam. Ureters are within normal limits. Bladder is partially distended. Stomach/Bowel: Scattered diverticular change of the colon is noted. No obstructive or inflammatory changes of the colon are seen. The appendix is within normal limits. No obstructive or inflammatory changes  of the small bowel or stomach are noted. Vascular/Lymphatic: Aortic atherosclerosis. No enlarged abdominal or pelvic lymph nodes. Reproductive: Status post hysterectomy. No adnexal masses. Other: No abdominal wall hernia or abnormality. No abdominopelvic ascites. Musculoskeletal: Degenerative changes of lumbar spine are noted. A intrathecal pain pump is noted. Small muscular lipoma is noted along the anterior abdominal wall on the left. This is stable in appearance. IMPRESSION: Diverticulosis without diverticulitis. No acute abnormality noted. The overall appearance is similar to that seen on prior exam. Electronically Signed   By: Inez Catalina M.D.   On: 01/11/2021 18:27   DG Chest 2 View  Result Date: 01/11/2021 CLINICAL DATA:  Shortness of breath. EXAM: CHEST - 2 VIEW COMPARISON:  CT chest 12/07/2020 and chest x-ray 12/11/2019 FINDINGS: The cardiac silhouette, mediastinal and hilar contours are within normal limits. Chronic bronchitic type interstitial lung changes and vague nodularity. No acute superimposed infiltrates, pulmonary edema or pleural effusions. The bony thorax is intact. IMPRESSION: Chronic lung changes but no acute pulmonary findings. Electronically Signed   By: Marijo Sanes M.D.   On: 01/11/2021 15:19     Assessment and plan- Patient is a 70 y.o. female referred for leukocytosis and thrombocytosis.  Etiology currently unclear  Leukocytosis: Differential has mainly shown Intermittent monocytosis as well as neutropenia which waxes and wanes since 2018 JAK2 mutation was negative flow cytometry and BCR ABL FISH testing was negative.  Given that there is no clear increasing trend I will hold off on a bone marrow biopsy at this time.  We will check CAL R and MPL mutation with the next set of blood work.  Thrombocytosis: Stable in the high 400s without a clear rising trend.  She did have evidence of iron deficiency and we did offer her 5 doses of Venofer which improved the anemia but  thrombocytosis persists.  Labs in 3 in 6 months and I will see her back in 6 months   Visit Diagnosis 1. Thrombocytosis   2. Monocytosis      Dr. Randa Evens, MD, MPH [facility] [phone] 01/22/2021 3:15 PM

## 2021-01-22 NOTE — Progress Notes (Signed)
Pt has chronic pain and has pain pump. Her pain is 6 today with back and neck pain. She is here to go over results of labs for her monocytosis and thrombocytosis.

## 2021-01-25 ENCOUNTER — Encounter: Payer: Self-pay | Admitting: Nurse Practitioner

## 2021-01-25 ENCOUNTER — Other Ambulatory Visit: Payer: Self-pay

## 2021-01-25 ENCOUNTER — Ambulatory Visit (INDEPENDENT_AMBULATORY_CARE_PROVIDER_SITE_OTHER): Payer: Medicare HMO | Admitting: Nurse Practitioner

## 2021-01-25 VITALS — BP 113/71 | HR 80 | Temp 98.7°F | Wt 160.4 lb

## 2021-01-25 DIAGNOSIS — I700 Atherosclerosis of aorta: Secondary | ICD-10-CM

## 2021-01-25 DIAGNOSIS — F324 Major depressive disorder, single episode, in partial remission: Secondary | ICD-10-CM

## 2021-01-25 DIAGNOSIS — I471 Supraventricular tachycardia, unspecified: Secondary | ICD-10-CM

## 2021-01-25 DIAGNOSIS — E1140 Type 2 diabetes mellitus with diabetic neuropathy, unspecified: Secondary | ICD-10-CM | POA: Diagnosis not present

## 2021-01-25 DIAGNOSIS — E1169 Type 2 diabetes mellitus with other specified complication: Secondary | ICD-10-CM | POA: Diagnosis not present

## 2021-01-25 DIAGNOSIS — E66811 Obesity, class 1: Secondary | ICD-10-CM

## 2021-01-25 DIAGNOSIS — E538 Deficiency of other specified B group vitamins: Secondary | ICD-10-CM | POA: Diagnosis not present

## 2021-01-25 DIAGNOSIS — J449 Chronic obstructive pulmonary disease, unspecified: Secondary | ICD-10-CM

## 2021-01-25 DIAGNOSIS — E1159 Type 2 diabetes mellitus with other circulatory complications: Secondary | ICD-10-CM | POA: Diagnosis not present

## 2021-01-25 DIAGNOSIS — E6609 Other obesity due to excess calories: Secondary | ICD-10-CM

## 2021-01-25 DIAGNOSIS — E785 Hyperlipidemia, unspecified: Secondary | ICD-10-CM

## 2021-01-25 DIAGNOSIS — I5022 Chronic systolic (congestive) heart failure: Secondary | ICD-10-CM | POA: Diagnosis not present

## 2021-01-25 DIAGNOSIS — D508 Other iron deficiency anemias: Secondary | ICD-10-CM

## 2021-01-25 DIAGNOSIS — G894 Chronic pain syndrome: Secondary | ICD-10-CM

## 2021-01-25 DIAGNOSIS — E559 Vitamin D deficiency, unspecified: Secondary | ICD-10-CM

## 2021-01-25 DIAGNOSIS — Z794 Long term (current) use of insulin: Secondary | ICD-10-CM

## 2021-01-25 DIAGNOSIS — Z978 Presence of other specified devices: Secondary | ICD-10-CM

## 2021-01-25 DIAGNOSIS — Z6833 Body mass index (BMI) 33.0-33.9, adult: Secondary | ICD-10-CM

## 2021-01-25 DIAGNOSIS — I152 Hypertension secondary to endocrine disorders: Secondary | ICD-10-CM

## 2021-01-25 DIAGNOSIS — E119 Type 2 diabetes mellitus without complications: Secondary | ICD-10-CM

## 2021-01-25 DIAGNOSIS — G4733 Obstructive sleep apnea (adult) (pediatric): Secondary | ICD-10-CM

## 2021-01-25 DIAGNOSIS — Z86711 Personal history of pulmonary embolism: Secondary | ICD-10-CM

## 2021-01-25 LAB — BAYER DCA HB A1C WAIVED: HB A1C (BAYER DCA - WAIVED): 7.7 % — ABNORMAL HIGH (ref <7.0)

## 2021-01-25 MED ORDER — CYANOCOBALAMIN 1000 MCG/ML IJ SOLN
1000.0000 ug | Freq: Once | INTRAMUSCULAR | Status: AC
  Administered 2021-01-25: 1000 ug via INTRAMUSCULAR

## 2021-01-25 NOTE — Progress Notes (Signed)
BP 113/71   Pulse 80   Temp 98.7 F (37.1 C) (Oral)   Wt 160 lb 6.4 oz (72.8 kg)   LMP  (LMP Unknown)   SpO2 96%   BMI 33.52 kg/m    Subjective:    [name], female    DOB: 1951/07/21, 70 y.o.   MRN: [id number]  HPI: Jordan Chan is a 69 y.o. female  Chief Complaint  Patient presents with   Diabetes   Hyperlipidemia   Hypertension   Pain   COPD   Mood   DIABETES Continues on Metformin 0347 MG BID, Trulicity 4.5 MG weekly, and Tresiba 40 units at HS.  Working with CCM team and provider. Recent A1C in March 8.7%.  She has tried [place] in past and this made her sick as a dog.  She also continues on monthly B12 shots for low B12 levels.  Followed by hematology and last 01/22/21 and received 5 iron infusions -- continues on folic acid.  Her WBC remains stable and if ever becomes elevated and continue to elevate then they will do bone marrow biopsy. Hypoglycemic episodes: none Polydipsia/polyuria: no Visual disturbance: no Chest pain: no Paresthesias: no Glucose Monitoring: yes  Accucheck frequency: BID  Fasting glucose: 130 - 160  Post prandial: after lunch 200 range -- eats pineapple and sweet tea  Evening:   Before meals: Taking Insulin?: yes  Long acting insulin: Tresiba 40 units  Short acting insulin: Blood Pressure Monitoring: just got cuff yesterday Retinal Examination: Up to Date Foot Exam: Up to Date Pneumovax: Up to Date Influenza: Up to Date Aspirin: yes   HYPERTENSION / HYPERLIPIDEMIA/CHF Continues on Atorvastatin 20 MG, Lisinopril 5 MG, Metoprolol 25MG daily + ASA. Recent labs with LDL 59 and TCHOL 151 in March 2022.  Most recent EF 12/11/20 showed EF 45% with mildly decreased LV function.  Last saw cardiology on 01/02/21 -- increased Metoprolol to 25 MG daily. Satisfied with current treatment? yes Duration of hypertension: chronic BP monitoring frequency: a few times a week BP range:  110-130/60-70 BP medication side  effects: no Duration of hyperlipidemia: chronic Cholesterol medication side effects: no Cholesterol supplements: none Medication compliance: good compliance Aspirin: yes Recent stressors: no Recurrent headaches: no Visual changes: no Palpitations: no Dyspnea: baseline, no worsening Chest pain: no Lower extremity edema: no Dizzy/lightheaded: no   COPD Followed by pulmonary for PE and COPD -- last saw 01/21/21.  Currently using Trelegy.  Has a history of smoking, quit 15 years ago. She does not use CPAP, was taken off O2 during daytime at recent pulmonary visit, but continues to use at night.  Is tcurrently off Eliquis, stopped in November 2021.   COPD status: controlled Satisfied with current treatment?: yes Oxygen use: no Dyspnea frequency: none Cough frequency: none Rescue inhaler frequency:  minimal Limitation of activity: no Productive cough: none Last Spirometry: with pulmonary Pneumovax: Up to Date Influenza: Up to Date  CHRONIC PAIN: Followed by [facility] in [place], last seen 12/27/20 -- continues pump + Gabapentin.  Continues to be followed by clinic nurse for pump check and replacement. She states her pain is well-controlled with current regimen.  Vitamin D on low side in past, taking supplement with recent level improved at 33.4.  No recent falls or fractures.  Normal DEXA 2017.  DEPRESSION Continues on Effexor 150 MG daily. Mood status: controlled Satisfied with current treatment?: yes Symptom severity: mild  Duration of current treatment : chronic Side effects: no  Medication compliance: good compliance Psychotherapy/counseling: has gone in the past Depressed mood: yes Anxious mood: no Anhedonia: no Significant weight loss or gain: no Insomnia: none Fatigue: no Feelings of worthlessness or guilt: no Impaired concentration/indecisiveness: no Suicidal ideations: no Hopelessness: no Crying spells: no Depression screen [facility] 2/9 12/14/2020 10/25/2020  07/26/2020 03/28/2020 12/23/2019  Decreased Interest 0 0 0 0 0  Down, Depressed, Hopeless 2 0 0 0 0  PHQ - 2 Score 2 0 0 0 0  Altered sleeping 3 3 0 3 3  Tired, decreased energy 3 3 0 3 3  Change in appetite 3 0 0 1 0  Feeling bad or failure about yourself  0 0 0 0 0  Trouble concentrating 0 0 0 1 3  Moving slowly or fidgety/restless 0 0 0 0 0  Suicidal thoughts 0 0 0 0 0  PHQ-9 Score 11 6 0 8 9  Difficult doing work/chores Somewhat difficult Not difficult at all Not difficult at all Not difficult at all Not difficult at all  Some recent data might be hidden    Relevant past medical, surgical, family and social history reviewed and updated as indicated. Interim medical history since our last visit reviewed. Allergies and medications reviewed and updated.  Review of Systems  Constitutional:  Negative for activity change, appetite change, diaphoresis, fatigue and fever.  Respiratory:  Negative for cough, chest tightness and shortness of breath.   Cardiovascular:  Negative for chest pain, palpitations and leg swelling.  Gastrointestinal: Negative.   Endocrine: Negative for heat intolerance, polydipsia, polyphagia and polyuria.  Neurological: Negative.   Psychiatric/Behavioral: Negative.     Per HPI unless specifically indicated above     Objective:    BP 113/71   Pulse 80   Temp 98.7 F (37.1 C) (Oral)   Wt 160 lb 6.4 oz (72.8 kg)   LMP  (LMP Unknown)   SpO2 96%   BMI 33.52 kg/m   Wt Readings from Last 3 Encounters:  01/25/21 160 lb 6.4 oz (72.8 kg)  01/22/21 159 lb 9.6 oz (72.4 kg)  01/11/21 158 lb 11.7 oz (72 kg)    Physical Exam Vitals and nursing note reviewed.  Constitutional:      General: She is awake. She is not in acute distress.    Appearance: She is well-developed and well-groomed. She is not ill-appearing.  HENT:     Head: Normocephalic.     Right Ear: Hearing normal.     Left Ear: Hearing normal.  Eyes:     General: Lids are normal.        Right eye:  No discharge.        Left eye: No discharge.     Conjunctiva/sclera: Conjunctivae normal.     Pupils: Pupils are equal, round, and reactive to light.  Neck:     Thyroid: No thyromegaly.     Vascular: No carotid bruit.  Cardiovascular:     Rate and Rhythm: Normal rate and regular rhythm.     Heart sounds: Normal heart sounds. No murmur heard.   No gallop.  Pulmonary:     Effort: Pulmonary effort is normal. No accessory muscle usage or respiratory distress.     Breath sounds: Normal breath sounds.     Comments:   Abdominal:     General: Bowel sounds are normal.     Palpations: Abdomen is soft.  Musculoskeletal:     Cervical back: Normal range of motion and neck supple.     Right  lower leg: No edema.     Left lower leg: No edema.  Lymphadenopathy:     Cervical: No cervical adenopathy.  Skin:    General: Skin is warm and dry.  Neurological:     Mental Status: She is alert and oriented to person, place, and time.  Psychiatric:        Attention and Perception: Attention normal.        Mood and Affect: Mood normal.        Speech: Speech normal.        Behavior: Behavior normal. Behavior is cooperative.        Thought Content: Thought content normal.    Results for orders placed or performed in visit on 01/25/21  Bayer DCA Hb A1c Waived  Result Value Ref Range   HB A1C (BAYER DCA - WAIVED) 7.7 (H) <7.0 %      Assessment & Plan:   Problem List Items Addressed This Visit       Cardiovascular and Mediastinum   Hypertension associated with diabetes ([place])    Chronic, stable with BP below goal on visit today and on home readings.  Recommend she continue to monitor BP at home regularly.  Focus on DASH diet.  Continue current medication regimen and adjust as needed, refills sent as needed.  CMP next visit with TSH.  Return in 3 months.        Relevant Orders   Bayer DCA Hb A1c Waived (Completed)   SVT (supraventricular tachycardia) (HCC)    Continue current medications and  collaboration with cardiology.  Recent note reviewed.       Aortic atherosclerosis ([place])    Noted on CT 12/11/19.  Continue daily statin.  Monitor closely for symptoms.       Chronic HFrEF (heart failure with reduced ejection fraction) (HCC)    Chronic, ongoing with recent EF 45%, followed by cardiology.  Euvolemic on exam today.  Continue current medication regimen and collaboration with cardiology.  Recommend: - Reminded to call for an overnight weight gain of >2 pounds or a weekly weight gain of >5 pounds - not adding salt to food and read food labels. Reviewed the importance of keeping daily sodium intake to <2058m daily.         Respiratory   Sleep apnea    Poor tolerance of CPAP mask, uses O2 night 2.5 L [place].       COPD, moderate (HCC)    Chronic, ongoing.  Continue Trelegy which offers benefit of medication minimization and has improved symptoms overall.  Continue to collaborate with Dr. FRaul Deland reviewed notes.  No current daytime O2.         Endocrine   Hyperlipidemia associated with type 2 diabetes mellitus (HCC)    Chronic, stable.  Continue current medication regimen and adjust as needed.  Lipid panel up to date -- LDL at goal.       Relevant Orders   Bayer DCA Hb A1c Waived (Completed)   Type 2 diabetes, controlled, with neuropathy ([place]    Chronic, ongoing, insulin dependent. Refer to insulin dependent diabetes plan.  Continue Gabapentin.       Relevant Orders   Bayer DCA Hb A1c Waived (Completed)   Insulin dependent type 2 diabetes mellitus (HCC)    Chronic, ongoing, insulin dependent. A1C 7.7% today, downward trend at this time.  Reassured patient.  Will continueTrulicity dose, 4.5 MG weekly, and continue Metformin and Tresiba at current doses at this time  as is trending down.  Recommend she continue to monitor BS consistently at home and document + focus heavily on diet changes.  She is aware to notify provider if fasting BS >130 consistently,  as may need to adjust insulin further.  Could consider SGLT2 in future, but in past poorly tolerated Jardiance -- discussed with her that and SGLT2 would benefit HF.  Return in 3 months.          Other   Presence of intrathecal pump (Chronic)    Continue to collaborate with pain clinic in [facility] who monitors this.       Obesity    BMI 33.52.  Recommended eating smaller high protein, low fat meals more frequently and exercising 30 mins a day 5 times a week with a goal of 10-15lb weight loss in the next 3 months. Patient voiced their understanding and motivation to adhere to these recommendations.        Depression, major, single episode, in partial remission (HCC)    Chronic, stable.  Denies SI/HI.  Mood well-controlled.  Continue current medication regimen and adjust as needed.  Refills as needed.  Consider trial of Duloxetine and transition off Effexor in future for dual pain and mood benefit.         B12 deficiency    Check B12 level next visit and continue injections monthly.       Chronic pain syndrome    Continue to collaborate with chronic pain clinic in [facility].  Has morphine pump.         Vitamin D deficiency    Recheck Vitamin D next visit and adjust supplement as needed.       History of pulmonary embolus (PE)    Acute, currently being followed by pulmonary.  Eliquis has been discontinued at this time.  Continue collaboration with pulmonary.       Iron deficiency anemia    Ongoing and stable since infusions.  Will continue to collaborate with hematology, recent notes and labs reviewed.         Follow up plan: Return in about 3 months (around 04/27/2021) for T2DM, HTN/HLD, MOOD, PAIN, COPD, HF.

## 2021-01-25 NOTE — Assessment & Plan Note (Addendum)
Continue current medications and collaboration with cardiology.  Recent note reviewed.

## 2021-01-25 NOTE — Assessment & Plan Note (Signed)
Chronic, ongoing.  Continue Trelegy which offers benefit of medication minimization and has improved symptoms overall.  Continue to collaborate with [name] and reviewed notes.  No current daytime O2.

## 2021-01-25 NOTE — Assessment & Plan Note (Addendum)
Chronic, stable with BP below goal on visit today and on home readings.  Recommend she continue to monitor BP at home regularly.  Focus on DASH diet.  Continue current medication regimen and adjust as needed, refills sent as needed.  CMP next visit with TSH.  Return in 3 months.

## 2021-01-25 NOTE — Assessment & Plan Note (Signed)
Continue to collaborate with pain clinic in [place] who monitors this. 

## 2021-01-25 NOTE — Assessment & Plan Note (Signed)
BMI 33.52.  Recommended eating smaller high protein, low fat meals more frequently and exercising 30 mins a day 5 times a week with a goal of 10-15lb weight loss in the next 3 months. Patient voiced their understanding and motivation to adhere to these recommendations.

## 2021-01-25 NOTE — Assessment & Plan Note (Signed)
Recheck Vitamin D next visit and adjust supplement as needed.

## 2021-01-25 NOTE — Assessment & Plan Note (Signed)
Chronic, stable.  Denies SI/HI.  Mood well-controlled.  Continue current medication regimen and adjust as needed.  Refills as needed.  Consider trial of Duloxetine and transition off Effexor in future for dual pain and mood benefit.   

## 2021-01-25 NOTE — Assessment & Plan Note (Signed)
Chronic, stable.  Continue current medication regimen and adjust as needed.  Lipid panel up to date -- LDL at goal.

## 2021-01-25 NOTE — Assessment & Plan Note (Signed)
Chronic, ongoing, insulin dependent. A1C 7.7% today, downward trend at this time.  Reassured patient.  Will continueTrulicity dose, 4.5 MG weekly, and continue Metformin and Tresiba at current doses at this time as is trending down.  Recommend she continue to monitor BS consistently at home and document + focus heavily on diet changes.  She is aware to notify provider if fasting BS >130 consistently, as may need to adjust insulin further.  Could consider SGLT2 in future, but in past poorly tolerated Jardiance -- discussed with her that and SGLT2 would benefit HF.  Return in 3 months.

## 2021-01-25 NOTE — Patient Instructions (Signed)
Heart Failure Action Plan A heart failure action plan helps you understand what to do when you have symptoms of heart failure. Your action plan is a color-coded plan that lists the symptoms to watch for and indicates what actions to take. If you have symptoms in the red zone, you need medical care right away. If you have symptoms in the yellow zone, you are having problems. If you have symptoms in the green zone, you are doing well. Follow the plan that was created by you and your health care provider. Reviewyour plan each time you visit your health care provider. Red zone These signs and symptoms mean you should get medical help right away: You have trouble breathing when resting. You have a dry cough that is getting worse. You have swelling or pain in your legs or abdomen that is getting worse. You suddenly gain more than 2-3 lb (0.9-1.4 kg) in 24 hours, or more than 5 lb (2.3 kg) in a week. This amount may be more or less depending on your condition. You have trouble staying awake or you feel confused. You have chest pain. You do not have an appetite. You pass out. You have worsening sadness or depression. If you have any of these symptoms, call your local emergency services (911 in the [place] right away. Do not drive yourself to the hospital. Yellow zone These signs and symptoms mean your condition may be getting worse and you should make some changes: You have trouble breathing when you are active, or you need to sleep with your head raised on extra pillows to help you breathe. You have swelling in your legs or abdomen. You gain 2-3 lb (0.9-1.4 kg) in 24 hours, or 5 lb (2.3 kg) in a week. This amount may be more or less depending on your condition. You get tired easily. You have trouble sleeping. You have a dry cough. If you have any of these symptoms: Contact your health care provider within the next day. Your health care provider may adjust your medicines. Green zone These signs  mean you are doing well and can continue what you are doing: You do not have shortness of breath. You have very little swelling or no new swelling. Your weight is stable (no gain or loss). You have a normal activity level. You do not have chest pain or any other new symptoms. Follow these instructions at home: Take over-the-counter and prescription medicines only as told by your health care provider. Weigh yourself daily. Your target weight is __________ lb (__________ kg). Call your health care provider if you gain more than __________ lb (__________ kg) in 24 hours, or more than __________ lb (__________ kg) in a week. Health care provider name: _____________________________________________________ Health care provider phone number: _____________________________________________________ Eat a heart-healthy diet. Work with a diet and nutrition specialist (dietitian) to create an eating plan that is best for you. Keep all follow-up visits. This is important. Where to find more information American Heart Association: www.heart.org Summary A heart failure action plan helps you understand what to do when you have symptoms of heart failure. Follow the action plan that was created by you and your health care provider. Get help right away if you have any symptoms in the red zone. This information is not intended to replace advice given to you by your health care provider. Make sure you discuss any questions you have with your healthcare provider. Document Revised: 03/05/2020 Document Reviewed: 03/05/2020 Elsevier Patient Education  2022 [employer].

## 2021-01-25 NOTE — Assessment & Plan Note (Signed)
Acute, currently being followed by pulmonary.  Eliquis has been discontinued at this time.  Continue collaboration with pulmonary.

## 2021-01-25 NOTE — Assessment & Plan Note (Signed)
Chronic, ongoing, insulin dependent. Refer to insulin dependent diabetes plan.  Continue Gabapentin.

## 2021-01-25 NOTE — Assessment & Plan Note (Signed)
Check B12 level next visit and continue injections monthly.

## 2021-01-25 NOTE — Assessment & Plan Note (Signed)
Ongoing and stable since infusions.  Will continue to collaborate with hematology, recent notes and labs reviewed.

## 2021-01-25 NOTE — Assessment & Plan Note (Signed)
Noted on CT 12/11/19.  Continue daily statin.  Monitor closely for symptoms.

## 2021-01-25 NOTE — Assessment & Plan Note (Signed)
Chronic, ongoing with recent EF 45%, followed by cardiology.  Euvolemic on exam today.  Continue current medication regimen and collaboration with cardiology.  Recommend: - Reminded to call for an overnight weight gain of >2 pounds or a weekly weight gain of >5 pounds - not adding salt to food and read food labels. Reviewed the importance of keeping daily sodium intake to 2000mg  daily.

## 2021-01-25 NOTE — Assessment & Plan Note (Signed)
Poor tolerance of CPAP mask, uses O2 night 2.5 L [place].

## 2021-01-25 NOTE — Assessment & Plan Note (Signed)
Continue to collaborate with chronic pain clinic in [place].  Has morphine pump.   

## 2021-01-27 DIAGNOSIS — R404 Transient alteration of awareness: Secondary | ICD-10-CM | POA: Diagnosis not present

## 2021-01-27 DIAGNOSIS — R069 Unspecified abnormalities of breathing: Secondary | ICD-10-CM | POA: Diagnosis not present

## 2021-01-27 DIAGNOSIS — R319 Hematuria, unspecified: Secondary | ICD-10-CM | POA: Diagnosis not present

## 2021-01-30 ENCOUNTER — Other Ambulatory Visit: Payer: Self-pay | Admitting: Nurse Practitioner

## 2021-01-30 NOTE — Telephone Encounter (Signed)
Future visit in 2 months  

## 2021-02-02 DIAGNOSIS — J45909 Unspecified asthma, uncomplicated: Secondary | ICD-10-CM | POA: Diagnosis not present

## 2021-02-02 DIAGNOSIS — J449 Chronic obstructive pulmonary disease, unspecified: Secondary | ICD-10-CM | POA: Diagnosis not present

## 2021-02-08 ENCOUNTER — Other Ambulatory Visit: Payer: Self-pay | Admitting: Oncology

## 2021-02-08 ENCOUNTER — Other Ambulatory Visit: Payer: Self-pay | Admitting: Internal Medicine

## 2021-02-09 NOTE — Progress Notes (Deleted)
Cardiology Office Note    Date:  02/09/2021   ID:  Jordan Chan, DOB September 01, 1950, MRN [id number]  PCP:  [name], NP  Cardiologist:  [name], MD  Electrophysiologist:  None   Chief Complaint: Follow-up  History of Present Illness:   Jordan Chan is a 70 y.o. female with history of normal coronary arteries by LHC in 04/2017, stress-induced cardiomyopathy in 04/2017 in the setting of pain pump malfunction and opioid withdrawal, aortic atherosclerosis, PE following hospitalization for pneumonia in the summer 2021 treated with Eliquis, COPD on nocturnal oxygen, IDDM2, breast cancer, HTN, HLD, sleep apnea, IBS, anemia, chronic leukocytosis, thrombocytosis, chronic low back pain with intrathecal pump, obesity, depression, and GERD who presents for follow-up of her cardiomyopathy.  Echo in 04/2017 demonstrated an EF of 25 to 30%, severe hypokinesis of the mid apical myocardium, and a mildly dilated left atrium.  Subsequent LHC in 04/2017 showed no evidence of angiographically significant CAD with a severely reduced LV contraction with an LVEF of 25 to 30% with mid and apical akinesis consistent with Takotsubo cardiomyopathy.  Following medical management, repeat echo in 08/2017 showed an improved LV systolic function with an EF of 50 to 55% and normal wall motion.  She underwent repeat echo in 07/2020, for shortness of breath, which showed an EF of 45 to 50%, global hypokinesis, grade 2 diastolic dysfunction, normal RV systolic function and ventricular cavity size, no significant valvular abnormalities, and an estimated right atrial pressure of 3 mmHg.  Despite treatment with steroids and addition of Singulair by her pulmonologist, her dyspnea has persisted.  At her visit in 12/2020 she indicated she had not been feeling well since 08/2020 and reported constant shortness of breath.  Her weight was up approximately 20 pounds since the preceding fall.  She did not notice much improvement  following the addition of furosemide.  She noted occasional edema in her feet as well as intermittent palpitations.  Labs obtained at that time showed an elevated D-dimer.  In the setting she was seen in the ED with subsequent CTA chest showing no evidence of PE.  Subsequent echo on 12/11/2020 showed an EF of 45%, global hypokinesis, grade 2 diastolic dysfunction, normal RV systolic function and ventricular cavity size, and no significant valvular abnormalities.  This echo was similar to prior study in 07/2020.  She was last seen in the office on 01/02/2021 and continued to feel the same, possibly slightly better than compared with her prior visit.  She indicated she had recently been diagnosed with anemia and had been started on folic acid as well as iron infusions.  She did continue to have shortness of breath with mild activity, and even when talking for extended time frames.  She reported a chronic cough.  No chest pain or palpitations.  Her lower extremity edema had been minimal.  At times, she reported feeling off balance.  She noted her BP had recently been low, which prompted a reduction of her lisinopril.  She was euvolemic on exam.  Her cardiomyopathy was not felt to be the only etiology driving her fatigue and shortness of breath.  Toprol-XL was titrated to 25 mg.  There was low suspicion for ischemic heart disease driving her symptoms given LHC in 04/2017 showing no evidence of CAD.  She was seen in the ED on 01/11/2021 with nausea, diarrhea, and chronic shortness of breath.  High-sensitivity troponin normal.  She has subsequently followed up with pulmonology and reported she was feeling "  great."  ***   Labs independently reviewed: 01/2021 - A1c 7.7, Hgb 12.0, PLT 454, potassium 4.2, BUN 10, serum creatinine 0.65, albumin 3.8, AST/ALT normal 10/2020 - TC 154, TG 104, HDL 73, LDL 59 07/2020 -TSH normal  Past Medical History:  Diagnosis Date   Arthritis    Asthma    Breast cancer ([place]) 1998    right breast ca with mastectomy and chemotherapy and radiation   Bronchitis    CHF (congestive heart failure) ([place])    "with Morphine withdrawal"   COPD (chronic obstructive pulmonary disease) ([place])    Diabetes mellitus without complication ([place])    Diverticulitis    diverticulosis also   Dyspnea    Endometriosis    GERD (gastroesophageal reflux disease)    History of shingles 2000-2005   Hypercholesteremia    Hypertension    IBS (irritable bowel syndrome)    Low back pain    a. Implanted morphine/bupivicaine/clonidine pump.   Neuropathy    Orthopnea    Oxygen dependent    uses at night   Personal history of chemotherapy    Personal history of radiation therapy    Pneumonia    pneumonia 5-6 times, history of bronchitis also   Scoliosis    Sleep apnea    does not use cpap   Stroke ([place]) 2010   TIA, 10 years ago   Withdrawal from sedative drug ([place])    withdrawal from morphine when pump batteries died    Past Surgical History:  Procedure Laterality Date   ABDOMINAL HYSTERECTOMY  1987   BACK SURGERY     Tailbone removed following fracture   BREAST SURGERY Right    mastectomy   CARDIAC CATHETERIZATION     CATARACT EXTRACTION W/PHACO Right 05/19/2019   Procedure: CATARACT EXTRACTION PHACO AND INTRAOCULAR LENS PLACEMENT ([place]), RIGHT, DIABETIC;  Surgeon: Marchia Meiers, MD;  Location: [facility] ORS;  Service: Ophthalmology;  Laterality: Right;  Lot # [id number] H [place]: 00:43.8 CDE: 4.59   CATARACT EXTRACTION W/PHACO Left 06/16/2019   Procedure: CATARACT EXTRACTION PHACO AND INTRAOCULAR LENS PLACEMENT ([place]) LEFT VISION BLUE DIABETIC;  Surgeon: Marchia Meiers, MD;  Location: [facility] ORS;  Service: Ophthalmology;  Laterality: Left;  Lot #[id number] H [place]: 00:46.9 CDE: 6.53   COCCYX REMOVAL     COLONOSCOPY WITH PROPOFOL N/A 01/01/2017   Procedure: COLONOSCOPY WITH PROPOFOL;  Surgeon: Jonathon Bellows, MD;  Location[facility] ENDOSCOPY;  Service: Endoscopy;  Laterality: N/A;   ELBOW ARTHROSCOPY WITH TENDON  RECONSTRUCTION     ESOPHAGOGASTRODUODENOSCOPY (EGD) WITH PROPOFOL N/A 01/01/2017   Procedure: ESOPHAGOGASTRODUODENOSCOPY (EGD) WITH PROPOFOL;  Surgeon: Jonathon Bellows, MD;  Location[facility] ENDOSCOPY;  Service: Endoscopy;  Laterality: N/A;   INTRATHECAL PUMP IMPLANT     LEFT HEART CATH AND CORONARY ANGIOGRAPHY N/A 04/28/2017   Procedure: LEFT HEART CATH AND CORONARY ANGIOGRAPHY;  Surgeon: [name], MD;  Location: [facility] CV LAB;  Service: Cardiovascular;  Laterality: N/A;   MASTECTOMY Right 06/1997   morphine pump  2011   PLANTAR FASCIA RELEASE     TRIGGER FINGER RELEASE      Current Medications: No outpatient medications have been marked as taking for the 02/14/21 encounter (Appointment) with Rise Mu, PA-C.    Allergies:   Other, Pain patch [menthol], Fentanyl, Avelox [moxifloxacin hcl in nacl], Doxycycline, Erythromycin, Moxifloxacin hcl, Oxycontin [oxycodone], and Ozempic [semaglutide]   Social History   Socioeconomic History   Marital status: Unknown    Spouse name: Not on file   Number of children: Not on  file   Years of education: Not on file   Highest education level: High school graduate  Occupational History   Occupation: retired  Tobacco Use   Smoking status: Former    Packs/day: 1.00    Years: 30.00    Pack years: 30.00    Types: Cigarettes    Quit date: 04/30/2004    Years since quitting: 16.7   Smokeless tobacco: Never  Vaping Use   Vaping Use: Never used  Substance and Sexual Activity   Alcohol use: No   Drug use: No   Sexual activity: Not Currently    Birth control/protection: Post-menopausal  Other Topics Concern   Not on file  Social History Narrative   ** Merged History Encounter **       Social Determinants of Health   Financial Resource Strain: Low Risk    Difficulty of Paying Living Expenses: Not hard at all  Food Insecurity: No Food Insecurity   Worried About Charity fundraiser in the Last Year: Never true   [place] in  the Last Year: Never true  Transportation Needs: No Transportation Needs   Lack of Transportation (Medical): No   Lack of Transportation (Non-Medical): No  Physical Activity: Inactive   Days of Exercise per Week: 0 days   Minutes of Exercise per Session: 0 min  Stress: No Stress Concern Present   Feeling of Stress : Not at all  Social Connections: Not on file     Family History:  The patient's family history includes Arthritis in her paternal grandfather; Cancer in her paternal grandmother and sister; Cancer (age of onset: 21) in her father; Cancer (age of onset: 14) in her mother; Hyperlipidemia in her sister and son; Hypertension in her sister; Osteoporosis in her maternal grandmother; Seizures in her son; Thyroid disease in her mother. There is no history of Breast cancer.  ROS:   ROS   EKGs/Labs/Other Studies Reviewed:    Studies reviewed were summarized above. The additional studies were reviewed today:  2D echo 12/2020: 1. Left ventricular ejection fraction, by estimation, is 45%. The left  ventricle has mildly decreased function. The left ventricle demonstrates  global hypokinesis. Left ventricular diastolic parameters are consistent  with Grade II diastolic dysfunction  (pseudonormalization).   2. Right ventricular systolic function is normal. The right ventricular  size is normal.   3. The mitral valve is normal in structure. No evidence of mitral valve  regurgitation.   4. The aortic valve is tricuspid. Aortic valve regurgitation is not  visualized.   Comparison(s): EF 45-50%. __________  2D echo 07/2020: 1. Left ventricular ejection fraction, by estimation, is 45 to 50%. The  left ventricle has mildly decreased function. The left ventricle  demonstrates global hypokinesis. Left ventricular diastolic parameters are  consistent with Grade II diastolic  dysfunction (pseudonormalization).   2. Right ventricular systolic function is normal. The right ventricular   size is normal.   3. The mitral valve is normal in structure. No evidence of mitral valve  regurgitation.   4. The aortic valve is tricuspid. Aortic valve regurgitation is not  visualized.   5. The inferior vena cava is normal in size with greater than 50%  respiratory variability, suggesting right atrial pressure of 3 mmHg. __________  Limited echo 08/2017: - Procedure narrative: LIMITED TTE to assess LVF.  - Left ventricle: The cavity size was normal. Wall thickness was    normal. Systolic function was normal. The estimated ejection  fraction was in the range of 50% to 55%. Wall motion was normal;    there were no regional wall motion abnormalities. The study is    not technically sufficient to allow evaluation of LV diastolic    function.   Impressions:   - Significant improvement in EF since last echo. __________  [facility] 04/2017: Conclusions: No angiographically significant coronary artery disease. Severely reduced LV contraction (LVEF 25-30%) with mid and apical akinesis, consistent with Takotsubo (stress-induced) cardiomyopathy. Mildly elevated left ventricular filling pressure.   Recommendations: Medical therapy, including initiation of metoprolol succinate 12.5 mg daily and furosemide 20 mg IV daily. If blood pressure tolerates, consider addition of low-dose ACE inhibitor tomorrow. Continue treatment of pain and opioid withdrawal. __________  2D echo 04/2017: - Left ventricle: The cavity size was at the upper limits of    normal. Wall thickness was normal. Systolic function was severely    reduced. The estimated ejection fraction was in the range of 25%    to 30%. There is severe hypokinesis of the mid-apical myocardium.    The study is not technically sufficient to allow evaluation of LV    diastolic function.  - Left atrium: The atrium was mildly dilated.  - Right ventricle: Poorly visualized. The cavity size was normal.   EKG:  EKG is ordered today.  The EKG  ordered today demonstrates ***  Recent Labs: 07/26/2020: TSH 3.100 12/07/2020: B Natriuretic Peptide 12.6 01/11/2021: ALT 13; BUN 10; Creatinine, Ser 0.65; Hemoglobin 12.0; Platelets 454; Potassium 4.2; Sodium 135  Recent Lipid Panel    Component Value Date/Time   CHOL 151 10/25/2020 0845   CHOL 127 07/26/2019 0817   TRIG 104 10/25/2020 0845   TRIG 104 07/26/2019 0817   HDL 73 10/25/2020 0845   VLDL 21 07/26/2019 0817   LDLCALC 59 10/25/2020 0845    PHYSICAL EXAM:    VS:  LMP  (LMP Unknown)   BMI: There is no height or weight on file to calculate BMI.  Physical Exam  Wt Readings from Last 3 Encounters:  01/25/21 160 lb 6.4 oz (72.8 kg)  01/22/21 159 lb 9.6 oz (72.4 kg)  01/11/21 158 lb 11.7 oz (72 kg)     ASSESSMENT & PLAN:   HFrEF secondary to NICM:  Aortic atherosclerosis/HLD: LDL 59 in 10/2020.   HTN: BP ***  Chronic dyspnea and fatigue:  History of PE: Occurred in the setting of hospitalization for pneumonia.  Status post treatment with Eliquis.  Recent chest CTA in 12/2020 showed no evidence of PE.  COPD on nocturnal oxygen.  Follow-up with pulmonology as directed.  Anemia/leukocytosis/thrombocytosis: Followed by hematology.  Obesity:  Disposition: F/u with Dr. Saunders Revel or an APP in ***.   Medication Adjustments/Labs and Tests Ordered: Current medicines are reviewed at length with the patient today.  Concerns regarding medicines are outlined above. Medication changes, Labs and Tests ordered today are summarized above and listed in the Patient Instructions accessible in Encounters.   Signed, [name], PA-C 02/09/2021 1:06 PM     [address], [place] [address] [phone]

## 2021-02-11 DIAGNOSIS — M1712 Unilateral primary osteoarthritis, left knee: Secondary | ICD-10-CM | POA: Diagnosis not present

## 2021-02-14 ENCOUNTER — Ambulatory Visit: Payer: Medicare HMO | Admitting: Physician Assistant

## 2021-03-01 ENCOUNTER — Encounter: Payer: Self-pay | Admitting: Nurse Practitioner

## 2021-03-01 ENCOUNTER — Ambulatory Visit (INDEPENDENT_AMBULATORY_CARE_PROVIDER_SITE_OTHER): Payer: Medicare HMO | Admitting: Licensed Clinical Social Worker

## 2021-03-01 DIAGNOSIS — F324 Major depressive disorder, single episode, in partial remission: Secondary | ICD-10-CM | POA: Diagnosis not present

## 2021-03-01 DIAGNOSIS — E1159 Type 2 diabetes mellitus with other circulatory complications: Secondary | ICD-10-CM

## 2021-03-01 DIAGNOSIS — J449 Chronic obstructive pulmonary disease, unspecified: Secondary | ICD-10-CM

## 2021-03-01 DIAGNOSIS — I152 Hypertension secondary to endocrine disorders: Secondary | ICD-10-CM

## 2021-03-01 DIAGNOSIS — G894 Chronic pain syndrome: Secondary | ICD-10-CM

## 2021-03-01 NOTE — Chronic Care Management (AMB) (Signed)
Chronic Care Management    Clinical Social Work Note  03/01/2021 Name: Jordan Chan MRN: [id number] DOB: [birth year]  Jordan Chan is a 70 y.o. year old female who is a primary care patient of Jordan Chan, Jordan Faster, NP. The CCM team was consulted to assist the patient with chronic disease management and/or care coordination needs related to: Mental Health Counseling and Resources.   Engaged with patient by telephone for follow up visit in response to provider referral for social work chronic care management and care coordination services.   Consent to Services:  The patient was given information about Chronic Care Management services, agreed to services, and gave verbal consent prior to initiation of services.  Please see initial visit note for detailed documentation.   Patient agreed to services and consent obtained.   Assessment: Patient is engaged in conversation, continues to maintain positive progress with care plan goals. Patient is doing well managing symptoms and complying with med management. Symptoms of anxiety regarding infusions have decreased. See Care Plan below for interventions and patient self-care actives.  Recommendation: Patient may benefit from, and is in agreement to work with LCSW to address care coordination needs and will continue to work with the clinical team to address health care and disease management related needs.   Follow up Plan: Patient would like continued follow-up from CCM LCSW .  Follow up scheduled in 05/02/21. Patient will call office if needed prior to next encounter.    SDOH (Social Determinants of Health) assessments and interventions performed:  SDOH Interventions    Flowsheet Row Most Recent Value  SDOH Interventions   Food Insecurity Interventions Intervention Not Indicated  Housing Interventions Intervention Not Indicated  Transportation Interventions Intervention Not Indicated        Advanced Directives Status: Not addressed in  this encounter.  CCM Care Plan  Allergies  Allergen Reactions   Other Palpitations    IV steroids   Pain Patch [Menthol] Anaphylaxis   Fentanyl    Avelox [Moxifloxacin Hcl In Nacl] Other (See Comments)    Upset stomach   Doxycycline Diarrhea   Erythromycin Nausea Only and Other (See Comments)    Can take a Z-Pak just fine Other reaction(s): Other (See Comments) Can take Z-Pak  Can take a Z-Pak just fine   Moxifloxacin Hcl Other (See Comments)    Upset stomach Upset stomach   Oxycontin [Oxycodone] Hives   Ozempic [Semaglutide] Nausea Only    Outpatient Encounter Medications as of 03/01/2021  Medication Sig   Accu-Chek Softclix Lancets lancets TEST BLOOD SUGAR THREE TIMES DAILY   albuterol (PROVENTIL HFA;VENTOLIN HFA) 108 (90 BASE) MCG/ACT inhaler Inhale 2 puffs into the lungs every 6 (six) hours as needed for wheezing or shortness of breath.    Alcohol Swabs (B-D SINGLE USE SWABS REGULAR) PADS USE TWICE DAILY  WITH  SUGAR  CHECKS   Blood Glucose Monitoring Suppl (TRUE METRIX METER) w/Device KIT Use to check blood sugar 4 times a day   cholecalciferol (VITAMIN D3) 25 MCG (1000 UNIT) tablet Take 1,000 Units by mouth daily.   Cyanocobalamin 1000 MCG/ML KIT Inject 1,000 mcg as directed every 30 (thirty) days.   cyclobenzaprine (FLEXERIL) 5 MG tablet Take 5 mg by mouth 2 (two) times daily as needed for muscle spasms. Takes very rarely   diclofenac (VOLTAREN) 50 MG EC tablet TAKE 1 TABLET THREE TIMES DAILY   Dulaglutide (TRULICITY) 4.5 JG/8.1LX SOPN Inject 4.5 mg as directed once a week.   folic acid (FOLVITE) 1  MG tablet TAKE 1 TABLET EVERY DAY   furosemide (LASIX) 40 MG tablet TAKE 1 TABLET (40 MG TOTAL) BY MOUTH DAILY.   gabapentin (NEURONTIN) 800 MG tablet Take 1 tablet (800 mg total) by mouth 3 (three) times daily.   glucose blood (TRUE METRIX BLOOD GLUCOSE TEST) test strip To check blood sugar 3 times a day.   insulin degludec (TRESIBA FLEXTOUCH) 100 UNIT/ML FlexTouch Pen  Inject 40 Units into the skin daily.   lactulose (CHRONULAC) 10 GM/15ML solution Take 45 mLs (30 g total) by mouth daily as needed for mild constipation or severe constipation.   lisinopril (ZESTRIL) 2.5 MG tablet Take 1 tablet (2.5 mg total) by mouth daily.   metFORMIN (GLUCOPHAGE) 1000 MG tablet Take 1 tablet (1,000 mg total) by mouth 2 (two) times daily with a meal.   metoprolol succinate (TOPROL-XL) 25 MG 24 hr tablet Take 1 tablet (25 mg total) by mouth daily.   montelukast (SINGULAIR) 10 MG tablet Take 10 mg by mouth at bedtime.   PAIN MANAGEMENT INTRATHECAL, IT, PUMP 1 each by Intrathecal route. Intrathecal (IT) medication:  Morphine Patient does not remember current. Adjusted every 2.5 months at [facility] in [place].   TRELEGY ELLIPTA 100-62.5-25 MCG/INH AEPB INHALE 1 PUFF INTO THE LUNGS DAILY.   venlafaxine XR (EFFEXOR-XR) 150 MG 24 hr capsule Take 1 capsule (150 mg total) by mouth daily.   No facility-administered encounter medications on file as of 03/01/2021.    Patient Active Problem List   Diagnosis Date Noted   Chronic HFrEF (heart failure with reduced ejection fraction) ([place]) 01/18/2021   Iron deficiency anemia 12/28/2020   Type 2 diabetes, controlled, with neuropathy ([place]) 07/20/2020   Insulin dependent type 2 diabetes mellitus ([place]) 07/20/2020   SOB (shortness of breath) 07/04/2020   History of pulmonary embolus (PE) 07/04/2020   Aortic atherosclerosis ([place]) 07/01/2020   SVT (supraventricular tachycardia) ([place])    Vitamin D deficiency 07/17/2019   History of non-ST elevation myocardial infarction (NSTEMI)    Presence of intrathecal pump 04/27/2017   Advanced care planning/counseling discussion 11/14/2016   Cervical scoliosis 05/20/2016   Hypertension associated with diabetes ([place]) 06/18/2015   B12 deficiency 03/19/2015   Sleep apnea 11/16/2014   Allergic rhinitis 11/16/2014   History of breast cancer 11/16/2014   GERD (gastroesophageal reflux  disease) 11/16/2014   COPD, moderate ([place]) 11/16/2014   Obesity 11/16/2014   Depression, major, single episode, in partial remission ([place]) 11/16/2014   Insomnia 11/16/2014   Hyperlipidemia associated with type 2 diabetes mellitus ([place]) 11/16/2014   History of sarcoma 09/27/2013   Chronic pain syndrome 09/30/2012    Conditions to be addressed/monitored: Depression; Mental Health Concerns   Care Plan : General Social Work (Adult)  Updates made by Rebekah Chesterfield, LCSW since 03/01/2021 12:00 AM     Problem: Coping Skills (General Plan of Care)      Long-Range Goal: Coping Skills Enhanced   Start Date: 10/01/2020  This Visit's Progress: On track  Recent Progress: On track  Priority: Medium  Note:   Timeframe:  Long-Range Goal Priority:  Medium Start Date: 10/01/20                        Expected End Date:  05/03/21                    Follow Up Date- 05/02/21   -Current Barriers:  Financial constraints  Limited social support Level of  care concerns ADL IADL limitations Social Isolation Depression  Clinical Social Work Clinical Goal(s):  Over the next 120 days, patient/caregiver will work with SW to address concerns related to lack of support/resource connection. LCSW will assist patient in gaining additional support/resource connection and community resource education in order to maintain health and mental health appropriately   Over the next 120 days, patient will demonstrate improved adherence to self care as evidenced by implementing healthy self-care into her daily routine such as: attending all medical appointments, deep breathing exercises, taking time for self and self-reflection, taking medications as prescribed, drinking water and daily exercise to improve mobility.   Over the next 120 days, patient will demonstrate improved health management independence as evidenced by implementing healthy self-care skills and positive support/resources into her daily routine to help  cope with stressors and improve overall physical and mental health and well-being   Interventions: Patient interviewed and appropriate assessments performed Patient reports symptoms of depression and anxiety are well managed Patient endorses decrease in anxiety symptoms regarding infusions. She has completed multiple infusion treatments Patient shared that she ensures to obtain rest when she is experiencing pain/discomfort from neck and back. Patient also utilizes morphine pump, when needed Patient continues to receive strong support from spouse CCM LCSW reviewed upcoming appointments Assessment for social determinants of health completed Patient reports feeling overwhelmed with chronic health conditions, stating "I have so much going on" Patient was recently diagnosed with CHF. Recently patient's Hematologist found patient's folic acid and iron to be low resulting in her having scheduled iron infusions starting today Patient reports feelings of anxiety regarding scheduled infusions due to having small veins that have a hx of exploding during injections. CCM LCSW discussed strategies to assist in grounding and management of anxiety and/or depression Patient agreed to playing solitaire on phone to assist with anxiety management Patient has a strong support system Patient reports being very stable and declines any urgent concerns or needs at this time.  Discussed plans with patient for ongoing care management follow up and provided patient with direct contact information for care management team Patient Self Care Activities:  Attend all scheduled provider appointments Utilize strategies discussed to assist in the management of symptoms Contact office with any questions or concerns      Christa See, MSW, [email or web address] Phone [phone] 3:20 PM

## 2021-03-01 NOTE — Patient Instructions (Signed)
Visit Information   Goals Addressed             This Visit's Progress    SW-Track and Manage My Symptoms-Depression   On track    Timeframe:  Long-Range Goal Priority:  Medium Start Date: 10/01/20                        Expected End Date:  05/03/21                    Follow Up Date- 05/02/21   Patient Self Care Activities:  Attend all scheduled provider appointments Utilize strategies discussed to assist in the management of symptoms Contact office with any questions or concerns        Patient verbalizes understanding of instructions provided today.   Telephone follow up appointment with care management team member scheduled for:05/02/21  Christa See, MSW, [email or web address] Phone [phone] 3:23 PM

## 2021-03-05 ENCOUNTER — Other Ambulatory Visit: Payer: Self-pay | Admitting: Nurse Practitioner

## 2021-03-05 DIAGNOSIS — J45909 Unspecified asthma, uncomplicated: Secondary | ICD-10-CM | POA: Diagnosis not present

## 2021-03-05 DIAGNOSIS — Z1231 Encounter for screening mammogram for malignant neoplasm of breast: Secondary | ICD-10-CM

## 2021-03-05 DIAGNOSIS — J449 Chronic obstructive pulmonary disease, unspecified: Secondary | ICD-10-CM | POA: Diagnosis not present

## 2021-03-06 DIAGNOSIS — G894 Chronic pain syndrome: Secondary | ICD-10-CM | POA: Diagnosis not present

## 2021-03-06 DIAGNOSIS — G8929 Other chronic pain: Secondary | ICD-10-CM | POA: Diagnosis not present

## 2021-03-06 DIAGNOSIS — Z978 Presence of other specified devices: Secondary | ICD-10-CM | POA: Diagnosis not present

## 2021-03-06 DIAGNOSIS — G893 Neoplasm related pain (acute) (chronic): Secondary | ICD-10-CM | POA: Diagnosis not present

## 2021-03-06 DIAGNOSIS — M792 Neuralgia and neuritis, unspecified: Secondary | ICD-10-CM | POA: Diagnosis not present

## 2021-03-06 DIAGNOSIS — M5442 Lumbago with sciatica, left side: Secondary | ICD-10-CM | POA: Diagnosis not present

## 2021-03-13 ENCOUNTER — Ambulatory Visit
Admission: RE | Admit: 2021-03-13 | Discharge: 2021-03-13 | Disposition: A | Discharge status: Home / Self Care | Payer: Medicare HMO | Source: Ambulatory Visit | Attending: Nurse Practitioner | Admitting: Nurse Practitioner

## 2021-03-13 ENCOUNTER — Other Ambulatory Visit: Payer: Self-pay

## 2021-03-13 DIAGNOSIS — Z1231 Encounter for screening mammogram for malignant neoplasm of breast: Secondary | ICD-10-CM | POA: Diagnosis present

## 2021-03-15 DIAGNOSIS — E119 Type 2 diabetes mellitus without complications: Secondary | ICD-10-CM | POA: Diagnosis not present

## 2021-03-15 LAB — HM DIABETES EYE EXAM

## 2021-03-22 DIAGNOSIS — J449 Chronic obstructive pulmonary disease, unspecified: Secondary | ICD-10-CM | POA: Diagnosis not present

## 2021-03-27 NOTE — Progress Notes (Signed)
Cardiology Office Note    Date:  03/29/2021   ID:  Jordan, Chan 21-Jun-1951, MRN [id number]  PCP:  Jordan Skiff, NP  Cardiologist:  Jordan Kendall, MD  Electrophysiologist:  None   Chief Complaint: Follow up  History of Present Illness:   Jordan Chan is a 70 y.o. female with history of stress-induced cardiomyopathy in 04/2017 in the setting of pain pump malfunction and opioid withdrawal, PE following hospitalization for PNA in the summer of 2021 treated with Eliquis, anemia/leukocytosis/thrombocytosis followed by hematology, chronic low back pain, DM, COPD, sleep apnea intolerant to CPAP on nocturnal oxygen, IBS, and GERD who presents for follow up of her cardiomyopathy.   Echo in 04/2017 showed an EF of 25-30%, severe hypokinesis of the mid and apical myocardium suggestive of stress-induced cardiomyopathy, mild left atrial enlargement, and a poorly visualized RV that was grossly normal in size.  LHC at that time showed no angiographically significant CAD with a severely reduced LVEF estimated at 25 to 30% with mid and apical akinesis consistent with stress-induced cardiomyopathy.  Repeat echo in 05/2017, through [facility], showed an improving LV systolic function with an EF of 45 to 50%, global hypokinesis, normal LV size and wall thickness, grade 1 diastolic dysfunction, and normal RV systolic function and ventricular cavity size.  Limited echo in 08/2017 showed a low normal LV systolic function with an EF of 50 to 55%.  Echo in 07/2020 showed an EF of 45 to 50%, global hypokinesis, grade 2 diastolic dysfunction, normal RV systolic function and ventricular cavity size, no pericardial effusion, no significant valvular abnormalities, and a normal CVP.    She was seen in 12/2020 and reported feeling short of breath and fatigue "all of the time since January."  D-dimer was elevated with subsequent CTA chest in the ED being negative for PE.  Echo from 12/2020, performed in the  context of feeling short of breath and fatigued "all of the time since January."  Showed an EF of 45% with global hypokinesis, grade 2 diastolic dysfunction, normal RV systolic function and ventricular cavity size, no pericardial effusion, and no significant valvular abnormalities.  She was last seen in the office in 01/2021 and continued to feel about the same, possibly slightly better when compared to her visit 1 month prior.  She had recently been diagnosed with anemia and was started on folate as well as iron infusions.  She continued to report shortness of breath with mild activity and with talking for extended time frames.  She had a chronic cough.  She had no chest pain or palpitations.  She noted minimal lower extremity edema.  At times, she reported feeling off balance as if she was "walking sideways."  It was noted her lisinopril had been decreased to 2.5 mg in the setting of low blood pressure.  Her cardiomyopathy was felt to not completely explain her symptoms of fatigue and shortness of breath.  She appeared euvolemic.  There was low suspicion for ischemic heart disease driving her symptoms given LHC as outlined above.  She was seen in the [facility] ED on 01/11/2021 with reports of diarrhea and ongoing shortness of breath.  High-sensitivity troponin was negative.  EKG showed sinus rhythm without acute ST-T changes.  CT of the abdomen/pelvis showed diverticulosis without diverticulitis.  She comes in doing well from a cardiac perspective.  Her main complaint at this time is continued fatigue, though she attributes this to sleeping poorly.  She is intolerant to her  CPAP though does wear nocturnal oxygen.  BP also runs on the softer side at times with readings in the low 100s to 1 teens with a current BP in the office of 100/60, not yet having taken her lisinopril, Lasix, or metoprolol.  Her weight remains stable.  No lower extremity swelling.  She has longstanding orthopnea, sleeping in a recliner  secondary to nerve pain stemming from her prior mastectomy in the late 1990s.  Her chronic dyspnea is stable.  No angina, palpitations, dizziness, presyncope, syncope, lower extremity swelling, or abdominal distention.  Overall, she feels like she is doing "good."   Labs independently reviewed: 01/2021 - A1c 7.7, Hgb 12.0, PLT 454, potassium 4.2, BUN 10, serum creatinine 0.65, albumin 3.8, AST/ALT normal 10/2020 - TC 151, TG 104, HDL 73, LDL 59 07/2020 - TSH normal  Past Medical History:  Diagnosis Date   Arthritis    Asthma    Breast cancer ([place]) 1998   right breast ca with mastectomy and chemotherapy and radiation   Bronchitis    CHF (congestive heart failure) ([place])    "with Morphine withdrawal"   COPD (chronic obstructive pulmonary disease) ([place])    Diabetes mellitus without complication ([place])    Diverticulitis    diverticulosis also   Dyspnea    Endometriosis    GERD (gastroesophageal reflux disease)    History of shingles 2000-2005   Hypercholesteremia    Hypertension    IBS (irritable bowel syndrome)    Low back pain    a. Implanted morphine/bupivicaine/clonidine pump.   Neuropathy    Orthopnea    Oxygen dependent    uses at night   Personal history of chemotherapy    Personal history of radiation therapy    Pneumonia    pneumonia 5-6 times, history of bronchitis also   Scoliosis    Sleep apnea    does not use cpap   Stroke ([place]) 2010   TIA, 10 years ago   Withdrawal from sedative drug ([place])    withdrawal from morphine when pump batteries died    Past Surgical History:  Procedure Laterality Date   ABDOMINAL HYSTERECTOMY  1987   BACK SURGERY     Tailbone removed following fracture   BREAST SURGERY Right    mastectomy   CARDIAC CATHETERIZATION     CATARACT EXTRACTION W/PHACO Right 05/19/2019   Procedure: CATARACT EXTRACTION PHACO AND INTRAOCULAR LENS PLACEMENT ([place]), RIGHT, DIABETIC;  Surgeon: Marchia Meiers, MD;  Location: [facility] ORS;  Service: Ophthalmology;   Laterality: Right;  Lot # [id number] H [place]: 00:43.8 CDE: 4.59   CATARACT EXTRACTION W/PHACO Left 06/16/2019   Procedure: CATARACT EXTRACTION PHACO AND INTRAOCULAR LENS PLACEMENT ([place]) LEFT VISION BLUE DIABETIC;  Surgeon: Marchia Meiers, MD;  Location: [facility] ORS;  Service: Ophthalmology;  Laterality: Left;  Lot #[id number] H [place]: 00:46.9 CDE: 6.53   COCCYX REMOVAL     COLONOSCOPY WITH PROPOFOL N/A 01/01/2017   Procedure: COLONOSCOPY WITH PROPOFOL;  Surgeon: Jonathon Bellows, MD;  Location: [facility] ENDOSCOPY;  Service: Endoscopy;  Laterality: N/A;   ELBOW ARTHROSCOPY WITH TENDON RECONSTRUCTION     ESOPHAGOGASTRODUODENOSCOPY (EGD) WITH PROPOFOL N/A 01/01/2017   Procedure: ESOPHAGOGASTRODUODENOSCOPY (EGD) WITH PROPOFOL;  Surgeon: Jonathon Bellows, MD;  Location: [facility] ENDOSCOPY;  Service: Endoscopy;  Laterality: N/A;   INTRATHECAL PUMP IMPLANT     LEFT HEART CATH AND CORONARY ANGIOGRAPHY N/A 04/28/2017   Procedure: LEFT HEART CATH AND CORONARY ANGIOGRAPHY;  Surgeon: [name], MD;  Location: [facility] CV LAB;  Service: Cardiovascular;  Laterality: N/A;  MASTECTOMY Right 06/1997   morphine pump  2011   PLANTAR FASCIA RELEASE     TRIGGER FINGER RELEASE      Current Medications: Current Meds  Medication Sig   Accu-Chek Softclix Lancets lancets TEST BLOOD SUGAR THREE TIMES DAILY   albuterol (PROVENTIL HFA;VENTOLIN HFA) 108 (90 BASE) MCG/ACT inhaler Inhale 2 puffs into the lungs every 6 (six) hours as needed for wheezing or shortness of breath.    Alcohol Swabs (B-D SINGLE USE SWABS REGULAR) PADS USE TWICE DAILY  WITH  SUGAR  CHECKS   Blood Glucose Monitoring Suppl (TRUE METRIX METER) w/Device KIT Use to check blood sugar 4 times a day   cholecalciferol (VITAMIN D3) 25 MCG (1000 UNIT) tablet Take 1,000 Units by mouth daily.   Cyanocobalamin 1000 MCG/ML KIT Inject 1,000 mcg as directed every 30 (thirty) days.   cyclobenzaprine (FLEXERIL) 5 MG tablet Take 5 mg by mouth 2 (two) times daily as needed for muscle  spasms. Takes very rarely   diclofenac (VOLTAREN) 50 MG EC tablet TAKE 1 TABLET THREE TIMES DAILY   Dulaglutide (TRULICITY) 4.5 KW/4.0XB SOPN Inject 4.5 mg as directed once a week.   folic acid (FOLVITE) 1 MG tablet TAKE 1 TABLET EVERY DAY   furosemide (LASIX) 40 MG tablet TAKE 1 TABLET (40 MG TOTAL) BY MOUTH DAILY.   gabapentin (NEURONTIN) 800 MG tablet Take 1 tablet (800 mg total) by mouth 3 (three) times daily.   glucose blood (TRUE METRIX BLOOD GLUCOSE TEST) test strip To check blood sugar 3 times a day.   insulin degludec (TRESIBA FLEXTOUCH) 100 UNIT/ML FlexTouch Pen Inject 40 Units into the skin daily.   lactulose (CHRONULAC) 10 GM/15ML solution Take 45 mLs (30 g total) by mouth daily as needed for mild constipation or severe constipation.   lisinopril (ZESTRIL) 2.5 MG tablet Take 1 tablet (2.5 mg total) by mouth daily.   metFORMIN (GLUCOPHAGE) 1000 MG tablet Take 1 tablet (1,000 mg total) by mouth 2 (two) times daily with a meal.   montelukast (SINGULAIR) 10 MG tablet Take 10 mg by mouth at bedtime.   PAIN MANAGEMENT INTRATHECAL, IT, PUMP 1 each by Intrathecal route. Intrathecal (IT) medication:  Morphine Patient does not remember current. Adjusted every 2.5 months at [facility] in [place].   TRELEGY ELLIPTA 100-62.5-25 MCG/INH AEPB INHALE 1 PUFF INTO THE LUNGS DAILY.   venlafaxine XR (EFFEXOR-XR) 150 MG 24 hr capsule Take 1 capsule (150 mg total) by mouth daily.   [DISCONTINUED] metoprolol succinate (TOPROL-XL) 25 MG 24 hr tablet Take 1 tablet (25 mg total) by mouth daily.    Allergies:   Other, Pain patch [menthol], Fentanyl, Avelox [moxifloxacin hcl in nacl], Doxycycline, Erythromycin, Moxifloxacin hcl, Oxycontin [oxycodone], and Ozempic [semaglutide]   Social History   Socioeconomic History   Marital status: Unknown    Spouse name: Not on file   Number of children: Not on file   Years of education: Not on file   Highest education level: High school graduate   Occupational History   Occupation: retired  Tobacco Use   Smoking status: Former    Packs/day: 1.00    Years: 30.00    Pack years: 30.00    Types: Cigarettes    Quit date: 04/30/2004    Years since quitting: 16.9   Smokeless tobacco: Never  Vaping Use   Vaping Use: Never used  Substance and Sexual Activity   Alcohol use: No   Drug use: No   Sexual activity: Not Currently  Birth control/protection: Post-menopausal  Other Topics Concern   Not on file  Social History Narrative   ** Merged History Encounter **       Social Determinants of Health   Financial Resource Strain: Low Risk    Difficulty of Paying Living Expenses: Not hard at all  Food Insecurity: No Food Insecurity   Worried About Charity fundraiser in the Last Year: Never true   Arboriculturist in the Last Year: Never true  Transportation Needs: No Transportation Needs   Lack of Transportation (Medical): No   Lack of Transportation (Non-Medical): No  Physical Activity: Inactive   Days of Exercise per Week: 0 days   Minutes of Exercise per Session: 0 min  Stress: No Stress Concern Present   Feeling of Stress : Not at all  Social Connections: Not on file     Family History:  The patient's family history includes Arthritis in her paternal grandfather; Cancer in her paternal grandmother and sister; Cancer (age of onset: 22) in her father; Cancer (age of onset: 35) in her mother; Hyperlipidemia in her sister and son; Hypertension in her sister; Osteoporosis in her maternal grandmother; Seizures in her son; Thyroid disease in her mother. There is no history of Breast cancer.  ROS:   Review of Systems  Constitutional:  Positive for malaise/fatigue. Negative for chills, diaphoresis, fever and weight loss.  HENT:  Negative for congestion.   Eyes:  Negative for discharge and redness.  Respiratory:  Positive for shortness of breath. Negative for cough, sputum production and wheezing.   Cardiovascular:  Negative for  chest pain, palpitations, orthopnea, claudication, leg swelling and PND.  Gastrointestinal:  Negative for abdominal pain, heartburn, nausea and vomiting.  Musculoskeletal:  Negative for falls and myalgias.  Skin:  Negative for rash.  Neurological:  Negative for dizziness, tingling, tremors, sensory change, speech change, focal weakness, loss of consciousness and weakness.  Endo/Heme/Allergies:  Does not bruise/bleed easily.  Psychiatric/Behavioral:  Negative for substance abuse. The patient is not nervous/anxious.   All other systems reviewed and are negative.   EKGs/Labs/Other Studies Reviewed:    Studies reviewed were summarized above. The additional studies were reviewed today:  2D echo 12/11/2020: 1. Left ventricular ejection fraction, by estimation, is 45%. The left  ventricle has mildly decreased function. The left ventricle demonstrates  global hypokinesis. Left ventricular diastolic parameters are consistent  with Grade II diastolic dysfunction  (pseudonormalization).   2. Right ventricular systolic function is normal. The right ventricular  size is normal.   3. The mitral valve is normal in structure. No evidence of mitral valve  regurgitation.   4. The aortic valve is tricuspid. Aortic valve regurgitation is not  visualized.   Comparison(s): EF 45-50%.  __________  2D echo 07/25/2020:  1. Left ventricular ejection fraction, by estimation, is 45 to 50%. The  left ventricle has mildly decreased function. The left ventricle  demonstrates global hypokinesis. Left ventricular diastolic parameters are  consistent with Grade II diastolic  dysfunction (pseudonormalization).   2. Right ventricular systolic function is normal. The right ventricular  size is normal.   3. The mitral valve is normal in structure. No evidence of mitral valve  regurgitation.   4. The aortic valve is tricuspid. Aortic valve regurgitation is not  visualized.   5. The inferior vena cava is normal in  size with greater than 50%  respiratory variability, suggesting right atrial pressure of 3 mmHg. __________  Limited echo 08/2017: - Procedure narrative:  LIMITED TTE to assess LVF.  - Left ventricle: The cavity size was normal. Wall thickness was    normal. Systolic function was normal. The estimated ejection    fraction was in the range of 50% to 55%. Wall motion was normal;    there were no regional wall motion abnormalities. The study is    not technically sufficient to allow evaluation of LV diastolic    function.   Impressions:   - Significant improvement in EF since last echo.  __________  2D echo 05/15/2017 ([facility]): EF 45 to 50%, mild diffuse hypokinesis, grade 1 diastolic dysfunction, normal RV systolic function __________  LHC 04/28/2017: Conclusions: No angiographically significant coronary artery disease. Severely reduced LV contraction (LVEF 25-30%) with mid and apical akinesis, consistent with Takotsubo (stress-induced) cardiomyopathy. Mildly elevated left ventricular filling pressure.   Recommendations: Medical therapy, including initiation of metoprolol succinate 12.5 mg daily and furosemide 20 mg IV daily. If blood pressure tolerates, consider addition of low-dose ACE inhibitor tomorrow. Continue treatment of pain and opioid withdrawal. __________  2D echo 04/28/2017: - Left ventricle: The cavity size was at the upper limits of    normal. Wall thickness was normal. Systolic function was severely    reduced. The estimated ejection fraction was in the range of 25%    to 30%. There is severe hypokinesis of the mid-apical myocardium.    The study is not technically sufficient to allow evaluation of LV    diastolic function.  - Left atrium: The atrium was mildly dilated.  - Right ventricle: Poorly visualized. The cavity size was normal.    EKG:  EKG is not ordered today.    Recent Labs: 07/26/2020: TSH 3.100 12/07/2020: B Natriuretic Peptide 12.6 01/11/2021:  ALT 13; BUN 10; Creatinine, Ser 0.65; Hemoglobin 12.0; Platelets 454; Potassium 4.2; Sodium 135  Recent Lipid Panel    Component Value Date/Time   CHOL 151 10/25/2020 0845   CHOL 127 07/26/2019 0817   TRIG 104 10/25/2020 0845   TRIG 104 07/26/2019 0817   HDL 73 10/25/2020 0845   VLDL 21 07/26/2019 0817   LDLCALC 59 10/25/2020 0845    PHYSICAL EXAM:    VS:  BP 100/60 (BP Location: Right Arm, Patient Position: Sitting, Cuff Size: Normal)   Pulse 90   Ht $R'4\' 10"'aE$  (1.473 m)   Wt 157 lb (71.2 kg)   LMP  (LMP Unknown)   SpO2 92%   BMI 32.81 kg/m   BMI: Body mass index is 32.81 kg/m.  Physical Exam Vitals reviewed.  Constitutional:      Appearance: She is well-developed.  HENT:     Head: Normocephalic and atraumatic.  Eyes:     General:        Right eye: No discharge.        Left eye: No discharge.  Neck:     Vascular: No JVD.  Cardiovascular:     Rate and Rhythm: Normal rate and regular rhythm.     Pulses:          Dorsalis pedis pulses are 2+ on the right side and 2+ on the left side.       Posterior tibial pulses are 2+ on the right side and 2+ on the left side.     Heart sounds: Normal heart sounds, S1 normal and S2 normal. Heart sounds not distant. No midsystolic click and no opening snap. No murmur heard.   No friction rub.  Pulmonary:     Effort: Pulmonary effort is  normal. No respiratory distress.     Breath sounds: Normal breath sounds. No decreased breath sounds, wheezing or rales.  Chest:     Chest wall: No tenderness.  Abdominal:     General: There is no distension.     Palpations: Abdomen is soft.     Tenderness: There is no abdominal tenderness.  Musculoskeletal:     Cervical back: Normal range of motion.  Skin:    General: Skin is warm and dry.     Nails: There is no clubbing.  Neurological:     Mental Status: She is alert and oriented to person, place, and time.  Psychiatric:        Speech: Speech normal.        Behavior: Behavior normal.         Thought Content: Thought content normal.        Judgment: Judgment normal.    Wt Readings from Last 3 Encounters:  03/29/21 157 lb (71.2 kg)  01/25/21 160 lb 6.4 oz (72.8 kg)  01/22/21 159 lb 9.6 oz (72.4 kg)     ASSESSMENT & PLAN:   HFrEF secondary to stress-induced cardiomyopathy: She appears euvolemic and well compensated.  It does not appear her mild cardiomyopathy alone explains her fatigue and chronic dyspnea.  I do feel like her fatigue is multifactorial including poor sleep hygiene with intolerance to CPAP, obesity, underlying pulmonary disease, and physical deconditioning.  Given his stable symptoms, further ischemic evaluation is deferred at this time.  Her BP is on the soft side today, therefore we will decrease Toprol-XL to 12.5 mg daily.  She will otherwise continue lisinopril 2.5 mg and furosemide 40 mg.  Should symptoms worsen ischemic evaluation would need to be reconsidered.  Anemia/leukocytosis/thrombocytosis: Followed by hematology.  COPD: Followed by pulmonology.  History of PE: Status post treatment with Eliquis.  No longer on therapy.  Obesity with sleep apnea: Intolerant to CPAP.  On nocturnal oxygen.  Likely contributing to her overall presentation of fatigue.  Follow-up with pulmonology as directed.  Fatigue: This is her largest complaint at this time, and is likely multifactorial as outlined above.  Decrease metoprolol.  Disposition: F/u with Dr. Saunders Revel or an APP in 3 months.   Medication Adjustments/Labs and Tests Ordered: Current medicines are reviewed at length with the patient today.  Concerns regarding medicines are outlined above. Medication changes, Labs and Tests ordered today are summarized above and listed in the Patient Instructions accessible in Encounters.   Signed, [name], PA-C 03/29/2021 1:04 PM     [address], [place] [address] [phone]

## 2021-03-29 ENCOUNTER — Ambulatory Visit (INDEPENDENT_AMBULATORY_CARE_PROVIDER_SITE_OTHER): Payer: Medicare HMO | Admitting: Physician Assistant

## 2021-03-29 ENCOUNTER — Other Ambulatory Visit: Payer: Self-pay

## 2021-03-29 ENCOUNTER — Encounter: Payer: Self-pay | Admitting: Physician Assistant

## 2021-03-29 VITALS — BP 100/60 | HR 90 | Ht <= 58 in | Wt 157.0 lb

## 2021-03-29 DIAGNOSIS — E66811 Obesity, class 1: Secondary | ICD-10-CM

## 2021-03-29 DIAGNOSIS — G4730 Sleep apnea, unspecified: Secondary | ICD-10-CM | POA: Diagnosis not present

## 2021-03-29 DIAGNOSIS — Z6832 Body mass index (BMI) 32.0-32.9, adult: Secondary | ICD-10-CM | POA: Diagnosis not present

## 2021-03-29 DIAGNOSIS — R5383 Other fatigue: Secondary | ICD-10-CM | POA: Diagnosis not present

## 2021-03-29 DIAGNOSIS — D649 Anemia, unspecified: Secondary | ICD-10-CM

## 2021-03-29 DIAGNOSIS — J42 Unspecified chronic bronchitis: Secondary | ICD-10-CM

## 2021-03-29 DIAGNOSIS — Z86711 Personal history of pulmonary embolism: Secondary | ICD-10-CM

## 2021-03-29 DIAGNOSIS — I5181 Takotsubo syndrome: Secondary | ICD-10-CM

## 2021-03-29 DIAGNOSIS — E669 Obesity, unspecified: Secondary | ICD-10-CM | POA: Diagnosis not present

## 2021-03-29 DIAGNOSIS — I5022 Chronic systolic (congestive) heart failure: Secondary | ICD-10-CM

## 2021-03-29 MED ORDER — METOPROLOL SUCCINATE ER 25 MG PO TB24: 12.5000 mg | ORAL_TABLET | Freq: Every day | ORAL | 0 refills | Status: DC

## 2021-03-29 NOTE — Patient Instructions (Signed)
Medication Instructions:  Your physician has recommended you make the following change in your medication:   REDUCE Metoprolol to 12.5 mg (1/2 tablet) daily.   *If you need a refill on your cardiac medications before your next appointment, please call your pharmacy*   Lab Work: None ordered If you have labs (blood work) drawn today and your tests are completely normal, you will receive your results only by: [place] (if you have MyChart) OR A paper copy in the mail If you have any lab test that is abnormal or we need to change your treatment, we will call you to review the results.   Testing/Procedures: None ordered   Follow-Up: At [facility], you and your health needs are our priority.  As part of our continuing mission to provide you with exceptional heart care, we have created designated Provider Care Teams.  These Care Teams include your primary Cardiologist (physician) and Advanced Practice Providers (APPs -  Physician Assistants and Nurse Practitioners) who all work together to provide you with the care you need, when you need it.  We recommend signing up for the patient portal called "MyChart".  Sign up information is provided on this After Visit Summary.  MyChart is used to connect with patients for Virtual Visits (Telemedicine).  Patients are able to view lab/test results, encounter notes, upcoming appointments, etc.  Non-urgent messages can be sent to your provider as well.   To learn more about what you can do with MyChart, go to [email or web address].    Your next appointment:   3 month(s)  The format for your next appointment:   In Person  Provider:   You may see [name], MD or one of the following Advanced Practice Providers on your designated Care Team:   Murray Hodgkins, NP [name], PA-C Marrianne Mood, PA-C Cadence Kathlen Mody, [place]   Other Instructions N/A

## 2021-04-01 DIAGNOSIS — R300 Dysuria: Secondary | ICD-10-CM | POA: Diagnosis not present

## 2021-04-01 DIAGNOSIS — N3000 Acute cystitis without hematuria: Secondary | ICD-10-CM | POA: Diagnosis not present

## 2021-04-04 DEATH — deceased

## 2021-04-05 DIAGNOSIS — J45909 Unspecified asthma, uncomplicated: Secondary | ICD-10-CM | POA: Diagnosis not present

## 2021-04-05 DIAGNOSIS — J449 Chronic obstructive pulmonary disease, unspecified: Secondary | ICD-10-CM | POA: Diagnosis not present

## 2021-04-10 ENCOUNTER — Encounter (INDEPENDENT_AMBULATORY_CARE_PROVIDER_SITE_OTHER): Payer: Self-pay

## 2021-04-10 ENCOUNTER — Ambulatory Visit (INDEPENDENT_AMBULATORY_CARE_PROVIDER_SITE_OTHER): Payer: Medicare HMO

## 2021-04-10 ENCOUNTER — Other Ambulatory Visit: Payer: Self-pay

## 2021-04-10 DIAGNOSIS — E538 Deficiency of other specified B group vitamins: Secondary | ICD-10-CM

## 2021-04-10 MED ORDER — CYANOCOBALAMIN 1000 MCG/ML IJ SOLN
1000.0000 ug | Freq: Once | INTRAMUSCULAR | Status: AC
  Administered 2021-04-10: 1000 ug via INTRAMUSCULAR

## 2021-04-24 ENCOUNTER — Other Ambulatory Visit: Payer: Self-pay

## 2021-04-24 ENCOUNTER — Inpatient Hospital Stay: Payer: Medicare HMO | Attending: Oncology

## 2021-04-24 DIAGNOSIS — D72821 Monocytosis (symptomatic): Secondary | ICD-10-CM | POA: Diagnosis not present

## 2021-04-24 DIAGNOSIS — D75839 Thrombocytosis, unspecified: Secondary | ICD-10-CM | POA: Insufficient documentation

## 2021-04-24 LAB — CBC WITH DIFFERENTIAL/PLATELET
Abs Immature Granulocytes: 0.04 K/uL (ref 0.00–0.07)
Basophils Absolute: 0.1 K/uL (ref 0.0–0.1)
Basophils Relative: 1 %
Eosinophils Absolute: 0.4 K/uL (ref 0.0–0.5)
Eosinophils Relative: 4 %
HCT: 40.1 % (ref 36.0–46.0)
Hemoglobin: 12.8 g/dL (ref 12.0–15.0)
Immature Granulocytes: 0 %
Lymphocytes Relative: 24 %
Lymphs Abs: 2.4 K/uL (ref 0.7–4.0)
MCH: 31.2 pg (ref 26.0–34.0)
MCHC: 31.9 g/dL (ref 30.0–36.0)
MCV: 97.8 fL (ref 80.0–100.0)
Monocytes Absolute: 0.7 K/uL (ref 0.1–1.0)
Monocytes Relative: 7 %
Neutro Abs: 6.4 K/uL (ref 1.7–7.7)
Neutrophils Relative %: 64 %
Platelets: 386 K/uL (ref 150–400)
RBC: 4.1 MIL/uL (ref 3.87–5.11)
RDW: 13.5 % (ref 11.5–15.5)
WBC: 10.1 K/uL (ref 4.0–10.5)
nRBC: 0 % (ref 0.0–0.2)

## 2021-04-26 ENCOUNTER — Ambulatory Visit: Payer: Medicare HMO | Admitting: Nurse Practitioner

## 2021-04-29 ENCOUNTER — Other Ambulatory Visit: Payer: Self-pay

## 2021-04-29 ENCOUNTER — Ambulatory Visit (INDEPENDENT_AMBULATORY_CARE_PROVIDER_SITE_OTHER): Payer: Medicare HMO | Admitting: Nurse Practitioner

## 2021-04-29 ENCOUNTER — Telehealth: Payer: Self-pay

## 2021-04-29 ENCOUNTER — Encounter: Payer: Self-pay | Admitting: Nurse Practitioner

## 2021-04-29 VITALS — BP 99/64 | HR 93 | Temp 98.4°F | Wt 156.6 lb

## 2021-04-29 DIAGNOSIS — E1169 Type 2 diabetes mellitus with other specified complication: Secondary | ICD-10-CM | POA: Diagnosis not present

## 2021-04-29 DIAGNOSIS — G4733 Obstructive sleep apnea (adult) (pediatric): Secondary | ICD-10-CM

## 2021-04-29 DIAGNOSIS — E119 Type 2 diabetes mellitus without complications: Secondary | ICD-10-CM | POA: Diagnosis not present

## 2021-04-29 DIAGNOSIS — E559 Vitamin D deficiency, unspecified: Secondary | ICD-10-CM

## 2021-04-29 DIAGNOSIS — Z86711 Personal history of pulmonary embolism: Secondary | ICD-10-CM

## 2021-04-29 DIAGNOSIS — E785 Hyperlipidemia, unspecified: Secondary | ICD-10-CM | POA: Diagnosis not present

## 2021-04-29 DIAGNOSIS — I5022 Chronic systolic (congestive) heart failure: Secondary | ICD-10-CM | POA: Diagnosis not present

## 2021-04-29 DIAGNOSIS — Z978 Presence of other specified devices: Secondary | ICD-10-CM | POA: Diagnosis not present

## 2021-04-29 DIAGNOSIS — Z23 Encounter for immunization: Secondary | ICD-10-CM | POA: Diagnosis not present

## 2021-04-29 DIAGNOSIS — J449 Chronic obstructive pulmonary disease, unspecified: Secondary | ICD-10-CM

## 2021-04-29 DIAGNOSIS — E1159 Type 2 diabetes mellitus with other circulatory complications: Secondary | ICD-10-CM

## 2021-04-29 DIAGNOSIS — G894 Chronic pain syndrome: Secondary | ICD-10-CM

## 2021-04-29 DIAGNOSIS — E6609 Other obesity due to excess calories: Secondary | ICD-10-CM

## 2021-04-29 DIAGNOSIS — Z794 Long term (current) use of insulin: Secondary | ICD-10-CM

## 2021-04-29 DIAGNOSIS — E538 Deficiency of other specified B group vitamins: Secondary | ICD-10-CM | POA: Diagnosis not present

## 2021-04-29 DIAGNOSIS — I152 Hypertension secondary to endocrine disorders: Secondary | ICD-10-CM | POA: Diagnosis not present

## 2021-04-29 DIAGNOSIS — E66811 Obesity, class 1: Secondary | ICD-10-CM

## 2021-04-29 DIAGNOSIS — F324 Major depressive disorder, single episode, in partial remission: Secondary | ICD-10-CM

## 2021-04-29 DIAGNOSIS — Z6832 Body mass index (BMI) 32.0-32.9, adult: Secondary | ICD-10-CM

## 2021-04-29 DIAGNOSIS — E1140 Type 2 diabetes mellitus with diabetic neuropathy, unspecified: Secondary | ICD-10-CM

## 2021-04-29 DIAGNOSIS — E875 Hyperkalemia: Secondary | ICD-10-CM

## 2021-04-29 LAB — CALRETICULIN (CALR) MUTATION ANALYSIS

## 2021-04-29 LAB — BAYER DCA HB A1C WAIVED: HB A1C (BAYER DCA - WAIVED): 8.8 % — ABNORMAL HIGH (ref 4.8–5.6)

## 2021-04-29 MED ORDER — ATORVASTATIN CALCIUM 20 MG PO TABS: 20.0000 mg | ORAL_TABLET | Freq: Every day | ORAL | 4 refills | Status: DC

## 2021-04-29 MED ORDER — SHINGRIX 50 MCG/0.5ML IM SUSR: 0.5000 mL | Freq: Once | INTRAMUSCULAR | 0 refills | Status: AC

## 2021-04-29 NOTE — Assessment & Plan Note (Signed)
Chronic, ongoing.  Continue Trelegy which offers benefit of medication minimization and has benefited symptoms.  Continue to collaborate with [name], recent notes reviewed.  No current daytime O2, at night only.

## 2021-04-29 NOTE — Assessment & Plan Note (Signed)
Continue to collaborate with chronic pain clinic in [place].  Has morphine pump.   

## 2021-04-29 NOTE — Assessment & Plan Note (Signed)
Poor tolerance of CPAP mask, uses O2 night 2.5 L [place].

## 2021-04-29 NOTE — Assessment & Plan Note (Signed)
Chronic, stable.  Continue current medication regimen and adjust as needed.  Lipid panel today, refills sent in.

## 2021-04-29 NOTE — Patient Instructions (Signed)
Diabetes Mellitus and Nutrition, Adult When you have diabetes, or diabetes mellitus, it is very important to have healthy eating habits because your blood sugar (glucose) levels are greatly affected by what you eat and drink. Eating healthy foods in the right amounts, at about the same times every day, can help you:  Control your blood glucose.  Lower your risk of heart disease.  Improve your blood pressure.  Reach or maintain a healthy weight. What can affect my meal plan? Every person with diabetes is different, and each person has different needs for a meal plan. Your health care provider may recommend that you work with a dietitian to make a meal plan that is best for you. Your meal plan may vary depending on factors such as:  The calories you need.  The medicines you take.  Your weight.  Your blood glucose, blood pressure, and cholesterol levels.  Your activity level.  Other health conditions you have, such as heart or kidney disease. How do carbohydrates affect me? Carbohydrates, also called carbs, affect your blood glucose level more than any other type of food. Eating carbs naturally raises the amount of glucose in your blood. Carb counting is a method for keeping track of how many carbs you eat. Counting carbs is important to keep your blood glucose at a healthy level, especially if you use insulin or take certain oral diabetes medicines. It is important to know how many carbs you can safely have in each meal. This is different for every person. Your dietitian can help you calculate how many carbs you should have at each meal and for each snack. How does alcohol affect me? Alcohol can cause a sudden decrease in blood glucose (hypoglycemia), especially if you use insulin or take certain oral diabetes medicines. Hypoglycemia can be a life-threatening condition. Symptoms of hypoglycemia, such as sleepiness, dizziness, and confusion, are similar to symptoms of having too much  alcohol.  Do not drink alcohol if: ? Your health care provider tells you not to drink. ? You are pregnant, may be pregnant, or are planning to become pregnant.  If you drink alcohol: ? Do not drink on an empty stomach. ? Limit how much you use to:  0-1 drink a day for women.  0-2 drinks a day for men. ? Be aware of how much alcohol is in your drink. In the [place] one drink equals one 12 oz bottle of beer (355 mL), one 5 oz glass of wine (148 mL), or one 1 oz glass of hard liquor (44 mL). ? Keep yourself hydrated with water, diet soda, or unsweetened iced tea.  Keep in mind that regular soda, juice, and other mixers may contain a lot of sugar and must be counted as carbs. What are tips for following this plan? Reading food labels  Start by checking the serving size on the "Nutrition Facts" label of packaged foods and drinks. The amount of calories, carbs, fats, and other nutrients listed on the label is based on one serving of the item. Many items contain more than one serving per package.  Check the total grams (g) of carbs in one serving. You can calculate the number of servings of carbs in one serving by dividing the total carbs by 15. For example, if a food has 30 g of total carbs per serving, it would be equal to 2 servings of carbs.  Check the number of grams (g) of saturated fats and trans fats in one serving. Choose foods that have   a low amount or none of these fats.  Check the number of milligrams (mg) of salt (sodium) in one serving. Most people should limit total sodium intake to less than 2,300 mg per day.  Always check the nutrition information of foods labeled as "low-fat" or "nonfat." These foods may be higher in added sugar or refined carbs and should be avoided.  Talk to your dietitian to identify your daily goals for nutrients listed on the label. Shopping  Avoid buying canned, pre-made, or processed foods. These foods tend to be high in fat, sodium, and added  sugar.  Shop around the outside edge of the grocery store. This is where you will most often find fresh fruits and vegetables, bulk grains, fresh meats, and fresh dairy. Cooking  Use low-heat cooking methods, such as baking, instead of high-heat cooking methods like deep frying.  Cook using healthy oils, such as olive, canola, or sunflower oil.  Avoid cooking with butter, cream, or high-fat meats. Meal planning  Eat meals and snacks regularly, preferably at the same times every day. Avoid going long periods of time without eating.  Eat foods that are high in fiber, such as fresh fruits, vegetables, beans, and whole grains. Talk with your dietitian about how many servings of carbs you can eat at each meal.  Eat 4-6 oz (112-168 g) of lean protein each day, such as lean meat, chicken, fish, eggs, or tofu. One ounce (oz) of lean protein is equal to: ? 1 oz (28 g) of meat, chicken, or fish. ? 1 egg. ?  cup (62 g) of tofu.  Eat some foods each day that contain healthy fats, such as avocado, nuts, seeds, and fish.   What foods should I eat? Fruits Berries. Apples. Oranges. Peaches. Apricots. Plums. Grapes. Mango. Papaya. Pomegranate. Kiwi. Cherries. Vegetables Lettuce. Spinach. Leafy greens, including kale, chard, collard greens, and mustard greens. Beets. Cauliflower. Cabbage. Broccoli. Carrots. Green beans. Tomatoes. Peppers. Onions. Cucumbers. Brussels sprouts. Grains Whole grains, such as whole-wheat or whole-grain bread, crackers, tortillas, cereal, and pasta. Unsweetened oatmeal. Quinoa. Brown or wild rice. Meats and other proteins Seafood. Poultry without skin. Lean cuts of poultry and beef. Tofu. Nuts. Seeds. Dairy Low-fat or fat-free dairy products such as milk, yogurt, and cheese. The items listed above may not be a complete list of foods and beverages you can eat. Contact a dietitian for more information. What foods should I avoid? Fruits Fruits canned with  syrup. Vegetables Canned vegetables. Frozen vegetables with butter or cream sauce. Grains Refined white flour and flour products such as bread, pasta, snack foods, and cereals. Avoid all processed foods. Meats and other proteins Fatty cuts of meat. Poultry with skin. Breaded or fried meats. Processed meat. Avoid saturated fats. Dairy Full-fat yogurt, cheese, or milk. Beverages Sweetened drinks, such as soda or iced tea. The items listed above may not be a complete list of foods and beverages you should avoid. Contact a dietitian for more information. Questions to ask a health care provider  Do I need to meet with a diabetes educator?  Do I need to meet with a dietitian?  What number can I call if I have questions?  When are the best times to check my blood glucose? Where to find more information:  American Diabetes Association: diabetes.org  Academy of Nutrition and Dietetics: www.eatright.org  National Institute of Diabetes and Digestive and Kidney Diseases: www.niddk.nih.gov  Association of Diabetes Care and Education Specialists: www.diabeteseducator.org Summary  It is important to have healthy eating   habits because your blood sugar (glucose) levels are greatly affected by what you eat and drink.  A healthy meal plan will help you control your blood glucose and maintain a healthy lifestyle.  Your health care provider may recommend that you work with a dietitian to make a meal plan that is best for you.  Keep in mind that carbohydrates (carbs) and alcohol have immediate effects on your blood glucose levels. It is important to count carbs and to use alcohol carefully. This information is not intended to replace advice given to you by your health care provider. Make sure you discuss any questions you have with your health care provider. Document Revised: 06/28/2019 Document Reviewed: 06/28/2019 Elsevier Patient Education  2021 Elsevier Inc.  

## 2021-04-29 NOTE — Assessment & Plan Note (Signed)
Chronic, ongoing with recent EF 45%, followed by cardiology.  Euvolemic on exam today.  Continue current medication regimen and collaboration with cardiology.  Recommend: - Reminded to call for an overnight weight gain of >2 pounds or a weekly weight gain of >5 pounds - not adding salt to food and read food labels. Reviewed the importance of keeping daily sodium intake to 2000mg  daily.

## 2021-04-29 NOTE — Assessment & Plan Note (Signed)
Chronic, stable.  Denies SI/HI.  Mood well-controlled.  Continue current medication regimen and adjust as needed.  Refills as needed.  Consider trial of Duloxetine and transition off Effexor in future for dual pain and mood benefit.   

## 2021-04-29 NOTE — Assessment & Plan Note (Addendum)
BMI 32.73 -- has lost 4 pounds since June.  Recommended eating smaller high protein, low fat meals more frequently and exercising 30 mins a day 5 times a week with a goal of 10-15lb weight loss in the next 3 months. Patient voiced their understanding and motivation to adhere to these recommendations.

## 2021-04-29 NOTE — Progress Notes (Signed)
BP 99/64   Pulse 93   Temp 98.4 F (36.9 C) (Oral)   Wt 156 lb 9.6 oz (71 kg)   LMP  (LMP Unknown)   SpO2 95%   BMI 32.73 kg/m    Subjective:    [name], female    DOB: 05-May-1951, 70 y.o.   MRN: [id number]  HPI: Jordan Chan is a 70 y.o. female  Chief Complaint  Patient presents with   Diabetes    Patient states she is doing well. Patient denies having any concern at today's visit.    Hyperlipidemia   Hypertension   Pain   COPD   Congestive Heart Failure   DIABETES Continues on Metformin 8101 MG BID, Trulicity 4.5 MG weekly, and Tresiba 40 units at HS.  Working with CCM team and provider. Recent A1C in June 7.7%.  She has tried [place] in past and this made her sick as a dog.  Continues on monthly B12 shots for low B12 levels.  Followed by hematology and last 01/22/21 and received 5 iron infusions total -- continues on folic acid.  Her WBC remains stable and if ever becomes elevated and continues to elevate then they will do bone marrow biopsy. Hypoglycemic episodes: none Polydipsia/polyuria: no Visual disturbance: no Chest pain: no Paresthesias: no Glucose Monitoring: yes  Accucheck frequency: BID  Fasting glucose: 110-150  Post prandial: 180 to 200 -- she will not give up her sweet tea  Evening:   Before meals: Taking Insulin?: yes  Long acting insulin: Tresiba 40 units  Short acting insulin: Blood Pressure Monitoring: occasional Retinal Examination: Up to Date Foot Exam: Up to Date Pneumovax: Up to Date Influenza: Up to Date Aspirin: yes   HYPERTENSION / HYPERLIPIDEMIA/CHF Continues on Atorvastatin 20 MG, Lisinopril 2.5 MG, Metoprolol 12.5 MG daily, Lasix 40 MG daily, + ASA.   Most recent EF 12/11/20 showed EF 45% with mildly decreased LV function.  Last saw cardiology on 03/29/21 -- Metoprolol was reduced at the time. Satisfied with current treatment? yes Duration of hypertension: chronic BP monitoring frequency: a few times  a week BP range:  on average 120-130/60 BP medication side effects: no Duration of hyperlipidemia: chronic Cholesterol medication side effects: no Cholesterol supplements: none Medication compliance: good compliance Aspirin: yes Recent stressors: no Recurrent headaches: no Visual changes: no Palpitations: no Dyspnea: baseline, no worsening Chest pain: no Lower extremity edema: no Dizzy/lightheaded: no   COPD Followed by pulmonary for PE and COPD -- last saw 03/22/21.  Currently using Trelegy and Singulair.    Has a history of smoking, quit 15 years ago. She does not use CPAP, but continues to use O2 at night.  Currently off Eliquis, stopped in November 2021.   COPD status: controlled Satisfied with current treatment?: yes Oxygen use: no Dyspnea frequency: none Cough frequency: none Rescue inhaler frequency:  minimal Limitation of activity: no Productive cough: none Last Spirometry: with pulmonary Pneumovax: Up to Date Influenza: Up to Date  CHRONIC PAIN: Followed by Alicia in [place], last seen 03/06/21 -- continues pump + Gabapentin.  Followed by clinic nurse for pump check and replacement. Reports pain is well-controlled with current regimen.    Vitamin D on low side in past, taking supplement with recent level improved at 33.4.  No recent falls or fractures.  Normal DEXA 2017.  DEPRESSION Continues on Effexor 150 MG daily. Mood status: controlled Satisfied with current treatment?: yes Symptom severity: mild  Duration of current treatment :  chronic Side effects: no Medication compliance: good compliance Psychotherapy/counseling: has gone in the past Depressed mood: yes Anxious mood: no Anhedonia: no Significant weight loss or gain: no Insomnia: none Fatigue: no Feelings of worthlessness or guilt: no Impaired concentration/indecisiveness: no Suicidal ideations: no Hopelessness: no Crying spells: no Depression screen [facility] 2/9 04/29/2021 12/14/2020  10/25/2020 07/26/2020 03/28/2020  Decreased Interest 0 0 0 0 0  Down, Depressed, Hopeless 1 2 0 0 0  PHQ - 2 Score 1 2 0 0 0  Altered sleeping 3 3 3  0 3  Tired, decreased energy 2 3 3  0 3  Change in appetite 2 3 0 0 1  Feeling bad or failure about yourself  0 0 0 0 0  Trouble concentrating 0 0 0 0 1  Moving slowly or fidgety/restless 0 0 0 0 0  Suicidal thoughts 0 0 0 0 0  PHQ-9 Score 8 11 6  0 8  Difficult doing work/chores Somewhat difficult Somewhat difficult Not difficult at all Not difficult at all Not difficult at all  Some recent data might be hidden   GAD 7 : Generalized Anxiety Score 04/29/2021 01/17/2019 01/17/2019  Nervous, Anxious, on Edge 1 1 1   Control/stop worrying 1 0 0  Worry too much - different things 1 1 1   Trouble relaxing 1 1 1   Restless 0 0 0  Easily annoyed or irritable 0 1 1  Afraid - awful might happen 0 0 0  Total GAD 7 Score 4 4 4   Anxiety Difficulty Not difficult at all Not difficult at all Not difficult at all      Relevant past medical, surgical, family and social history reviewed and updated as indicated. Interim medical history since our last visit reviewed. Allergies and medications reviewed and updated.  Review of Systems  Constitutional:  Negative for activity change, appetite change, diaphoresis, fatigue and fever.  Respiratory:  Negative for cough, chest tightness and shortness of breath.   Cardiovascular:  Negative for chest pain, palpitations and leg swelling.  Gastrointestinal: Negative.   Endocrine: Negative for heat intolerance, polydipsia, polyphagia and polyuria.  Neurological: Negative.   Psychiatric/Behavioral: Negative.     Per HPI unless specifically indicated above     Objective:    BP 99/64   Pulse 93   Temp 98.4 F (36.9 C) (Oral)   Wt 156 lb 9.6 oz (71 kg)   LMP  (LMP Unknown)   SpO2 95%   BMI 32.73 kg/m   Wt Readings from Last 3 Encounters:  04/29/21 156 lb 9.6 oz (71 kg)  03/29/21 157 lb (71.2 kg)  01/25/21 160  lb 6.4 oz (72.8 kg)    Physical Exam Vitals and nursing note reviewed.  Constitutional:      General: She is awake. She is not in acute distress.    Appearance: She is well-developed and well-groomed. She is not ill-appearing.  HENT:     Head: Normocephalic.     Right Ear: Hearing normal.     Left Ear: Hearing normal.  Eyes:     General: Lids are normal.        Right eye: No discharge.        Left eye: No discharge.     Conjunctiva/sclera: Conjunctivae normal.     Pupils: Pupils are equal, round, and reactive to light.  Neck:     Thyroid: No thyromegaly.     Vascular: No carotid bruit.  Cardiovascular:     Rate and Rhythm: Normal rate and regular rhythm.  Heart sounds: Normal heart sounds. No murmur heard.   No gallop.  Pulmonary:     Effort: Pulmonary effort is normal. No accessory muscle usage or respiratory distress.     Breath sounds: Normal breath sounds.     Comments:   Abdominal:     General: Bowel sounds are normal.     Palpations: Abdomen is soft.  Musculoskeletal:     Cervical back: Normal range of motion and neck supple.     Right lower leg: No edema.     Left lower leg: No edema.  Lymphadenopathy:     Cervical: No cervical adenopathy.  Skin:    General: Skin is warm and dry.  Neurological:     Mental Status: She is alert and oriented to person, place, and time.  Psychiatric:        Attention and Perception: Attention normal.        Mood and Affect: Mood normal.        Speech: Speech normal.        Behavior: Behavior normal. Behavior is cooperative.        Thought Content: Thought content normal.    Results for orders placed or performed in visit on 04/24/21  CBC with Differential  Result Value Ref Range   WBC 10.1 4.0 - 10.5 K/uL   RBC 4.10 3.87 - 5.11 MIL/uL   Hemoglobin 12.8 12.0 - 15.0 g/dL   HCT 40.1 36.0 - 46.0 %   MCV 97.8 80.0 - 100.0 fL   MCH 31.2 26.0 - 34.0 pg   MCHC 31.9 30.0 - 36.0 g/dL   RDW 13.5 11.5 - 15.5 %   Platelets 386 150  - 400 K/uL   nRBC 0.0 0.0 - 0.2 %   Neutrophils Relative % 64 %   Neutro Abs 6.4 1.7 - 7.7 K/uL   Lymphocytes Relative 24 %   Lymphs Abs 2.4 0.7 - 4.0 K/uL   Monocytes Relative 7 %   Monocytes Absolute 0.7 0.1 - 1.0 K/uL   Eosinophils Relative 4 %   Eosinophils Absolute 0.4 0.0 - 0.5 K/uL   Basophils Relative 1 %   Basophils Absolute 0.1 0.0 - 0.1 K/uL   Immature Granulocytes 0 %   Abs Immature Granulocytes 0.04 0.00 - 0.07 K/uL      Assessment & Plan:   Problem List Items Addressed This Visit       Cardiovascular and Mediastinum   Hypertension associated with diabetes ([place])    Chronic, stable with BP slightly on lower side today, but home readings at goal.  Recommend she continue to monitor BP at home regularly.  Focus on DASH diet.  Continue current medication regimen and adjust as needed, refills sent as needed.  CMP and TSH today.  Return in 3 months.       Relevant Medications   atorvastatin (LIPITOR) 20 MG tablet   Other Relevant Orders   Bayer DCA Hb A1c Waived   Comprehensive metabolic panel   TSH   Chronic HFrEF (heart failure with reduced ejection fraction) (HCC)    Chronic, ongoing with recent EF 45%, followed by cardiology.  Euvolemic on exam today.  Continue current medication regimen and collaboration with cardiology.  Recommend: - Reminded to call for an overnight weight gain of >2 pounds or a weekly weight gain of >5 pounds - not adding salt to food and read food labels. Reviewed the importance of keeping daily sodium intake to <2064m daily.      Relevant  Medications   atorvastatin (LIPITOR) 20 MG tablet     Respiratory   Sleep apnea    Poor tolerance of CPAP mask, uses O2 night 2.5 L [place].      COPD, moderate (HCC)    Chronic, ongoing.  Continue Trelegy which offers benefit of medication minimization and has benefited symptoms.  Continue to collaborate with [name], recent notes reviewed.  No current daytime O2, at night only.        Endocrine    Hyperlipidemia associated with type 2 diabetes mellitus (HCC)    Chronic, stable.  Continue current medication regimen and adjust as needed.  Lipid panel today, refills sent in.      Relevant Medications   atorvastatin (LIPITOR) 20 MG tablet   Other Relevant Orders   Bayer DCA Hb A1c Waived   Lipid Panel w/o Chol/HDL Ratio   Type 2 diabetes, controlled, with neuropathy (HCC) - Primary    Chronic, ongoing, insulin dependent. Refer to insulin dependent diabetes plan.  Continue Gabapentin.      Relevant Medications   atorvastatin (LIPITOR) 20 MG tablet   Other Relevant Orders   Bayer DCA Hb A1c Waived   Insulin dependent type 2 diabetes mellitus (HCC)    Chronic, ongoing, insulin dependent. A1C 8.8% today, upward trend at this time.  Will continueTrulicity dose, 4.5 MG weekly, and continue Metformin at max dosing.  Increase Tresiba to 50 units and may need to add on meal insulin next visit.  Recommend she continue to monitor BS consistently at home and document + focus heavily on diet changes.  She is aware to notify provider if fasting BS >130 consistently, as may need to adjust insulin further.  In past poorly tolerated Jardiance -- discussed with her that and SGLT2 would benefit HF -- however concern for side effects.  Return in 3 months.       Relevant Medications   atorvastatin (LIPITOR) 20 MG tablet     Other   Presence of intrathecal pump (Chronic)    Continue to collaborate with pain clinic in [facility] who monitors this.      Obesity    BMI 32.73 -- has lost 4 pounds since June.  Recommended eating smaller high protein, low fat meals more frequently and exercising 30 mins a day 5 times a week with a goal of 10-15lb weight loss in the next 3 months. Patient voiced their understanding and motivation to adhere to these recommendations.       Depression, major, single episode, in partial remission (HCC)    Chronic, stable.  Denies SI/HI.  Mood well-controlled.  Continue  current medication regimen and adjust as needed.  Refills as needed.  Consider trial of Duloxetine and transition off Effexor in future for dual pain and mood benefit.        B12 deficiency    Chronic, stable.  Check B12 today and continue injections monthly.      Relevant Orders   Vitamin B12   Chronic pain syndrome    Continue to collaborate with chronic pain clinic in [facility].  Has morphine pump.        Vitamin D deficiency    Chronic, stable.  Recheck Vitamin D today and adjust supplement as needed.      Relevant Orders   VITAMIN D 25 Hydroxy (Vit-D Deficiency, Fractures)   History of pulmonary embolus (PE)    Acute, currently being followed by pulmonary.  Eliquis has been discontinued at this time.  Continue  collaboration with pulmonary.      Other Visit Diagnoses     Flu vaccine need       Flu vaccine today   Relevant Orders   Flu Vaccine QUAD High Dose(Fluad) (Completed)   Need for shingles vaccine       Shingrix vaccines ordered   Relevant Medications   Zoster Vaccine Adjuvanted [facility]) injection   Zoster Vaccine Adjuvanted [facility]) injection (Start on 07/25/2021)        Follow up plan: Return in about 3 months (around 07/29/2021) for T2DM, HTN/HLD, CHRONIC PAIN, CHF, COPD.

## 2021-04-29 NOTE — Assessment & Plan Note (Signed)
Chronic, stable.  Check B12 today and continue injections monthly.

## 2021-04-29 NOTE — Assessment & Plan Note (Signed)
Chronic, ongoing, insulin dependent. A1C 8.8% today, upward trend at this time.  Will continueTrulicity dose, 4.5 MG weekly, and continue Metformin at max dosing.  Increase Tresiba to 50 units and may need to add on meal insulin next visit.  Recommend she continue to monitor BS consistently at home and document + focus heavily on diet changes.  She is aware to notify provider if fasting BS >130 consistently, as may need to adjust insulin further.  In past poorly tolerated Jardiance -- discussed with her that and SGLT2 would benefit HF -- however concern for side effects.  Return in 3 months.

## 2021-04-29 NOTE — Assessment & Plan Note (Signed)
Continue to collaborate with pain clinic in [place] who monitors this. 

## 2021-04-29 NOTE — Chronic Care Management (AMB) (Signed)
Chronic Care Management Pharmacy Assistant   Name: Jordan Chan  MRN: [id number] DOB: March 02, 1951  Jordan Chan is an 70 y.o. year old female who presents for his follow-up CCM visit with the clinical pharmacist.  Reason for Encounter: Disease State Diabetes   Recent office visits:  04/29/21 Jordan Guarneri NP PCP- pt was seen for Type 2 DM. Labs were ordered and pt was started on Atorvastatin  Calcium 20 mg Daily and received Zoster Vaccine 50 mcg intramuscular. Follow up in 3 months. Increased insulin to 40 units weekly  01/25/21 Jordan Guarneri NP- pt was seen for Type 2 DM. Labs for A1c were ordered and pt was given Cyanocobalamin 1000 mcg during this visit. Follow up in 3 months.   Recent consult visits:  03/29/21 Jordan Faith PA-C Cardiology- pt was seen for Chronic HFrEF. No labs were ordered and pt Metoprolol Succinate was reduced to 12.5 mg. Pt was advised to follow up with Dr. Saunders Chan in 3 months.  03/22/21 Jordan Huh MD Pulmonology- pt was seen for Moderate COPD. No labs, referrals, or follow up noted.   Hospital visits:  None in previous 6 months  Medications: Outpatient Encounter Medications as of 04/29/2021  Medication Sig   Accu-Chek Softclix Lancets lancets TEST BLOOD SUGAR THREE TIMES DAILY   albuterol (PROVENTIL HFA;VENTOLIN HFA) 108 (90 BASE) MCG/ACT inhaler Inhale 2 puffs into the lungs every 6 (six) hours as needed for wheezing or shortness of breath.    Alcohol Swabs (B-D SINGLE USE SWABS REGULAR) PADS USE TWICE DAILY  WITH  SUGAR  CHECKS   atorvastatin (LIPITOR) 20 MG tablet Take 1 tablet (20 mg total) by mouth daily.   Blood Glucose Monitoring Suppl (TRUE METRIX METER) w/Device KIT Use to check blood sugar 4 times a day   cholecalciferol (VITAMIN D3) 25 MCG (1000 UNIT) tablet Take 1,000 Units by mouth daily.   Cyanocobalamin 1000 MCG/ML KIT Inject 1,000 mcg as directed every 30 (thirty) days.   cyclobenzaprine (FLEXERIL) 5 MG tablet Take 5 mg by mouth 2  (two) times daily as needed for muscle spasms. Takes very rarely   diclofenac (VOLTAREN) 50 MG EC tablet TAKE 1 TABLET THREE TIMES DAILY   Dulaglutide (TRULICITY) 4.5 ST/4.1DQ SOPN Inject 4.5 mg as directed once a week.   folic acid (FOLVITE) 1 MG tablet TAKE 1 TABLET EVERY DAY   furosemide (LASIX) 40 MG tablet TAKE 1 TABLET (40 MG TOTAL) BY MOUTH DAILY.   gabapentin (NEURONTIN) 800 MG tablet Take 1 tablet (800 mg total) by mouth 3 (three) times daily.   glucose blood (TRUE METRIX BLOOD GLUCOSE TEST) test strip To check blood sugar 3 times a day.   insulin degludec (TRESIBA FLEXTOUCH) 100 UNIT/ML FlexTouch Pen Inject 50 Units into the skin daily.   lactulose (CHRONULAC) 10 GM/15ML solution Take 45 mLs (30 g total) by mouth daily as needed for mild constipation or severe constipation.   lisinopril (ZESTRIL) 2.5 MG tablet Take 1 tablet (2.5 mg total) by mouth daily.   metFORMIN (GLUCOPHAGE) 1000 MG tablet Take 1 tablet (1,000 mg total) by mouth 2 (two) times daily with a meal.   metoprolol succinate (TOPROL-XL) 25 MG 24 hr tablet Take 0.5 tablets (12.5 mg total) by mouth daily.   montelukast (SINGULAIR) 10 MG tablet Take 10 mg by mouth at bedtime.   PAIN MANAGEMENT INTRATHECAL, IT, PUMP 1 each by Intrathecal route. Intrathecal (IT) medication:  Morphine Patient does not remember current. Adjusted every 2.5 months at [facility] in [place].   TRELEGY ELLIPTA 100-62.5-25 MCG/INH AEPB INHALE 1 PUFF INTO THE LUNGS DAILY.   venlafaxine XR (EFFEXOR-XR) 150 MG 24 hr capsule Take 1 capsule (150 mg total) by mouth daily.   Zoster Vaccine Adjuvanted [facility]) injection Inject 0.5 mLs into the muscle once for 1 dose. Dose # 1   [START ON 07/25/2021] Zoster Vaccine Adjuvanted [facility]) injection Inject 0.5 mLs into the muscle once for 1 dose. Dose # 2   No facility-administered encounter medications on file as of 04/29/2021.    Recent Relevant Labs: Lab Results  Component Value  Date/Time   HGBA1C 7.7 (H) 01/25/2021 08:57 AM   HGBA1C 8.7 (H) 10/25/2020 08:43 AM   HGBA1C 7.7% 04/28/2016 12:00 AM   MICROALBUR 10 10/25/2020 08:43 AM   MICROALBUR 10 11/15/2019 09:38 AM    Kidney Function Lab Results  Component Value Date/Time   CREATININE 0.65 01/11/2021 02:29 PM   CREATININE 0.89 12/07/2020 10:36 AM   CREATININE 0.68 09/14/2013 07:42 PM   GFRNONAA >60 01/11/2021 02:29 PM   GFRNONAA >60 09/14/2013 07:42 PM   GFRAA 92 07/26/2020 09:02 AM   GFRAA >60 09/14/2013 07:42 PM    Current antihyperglycemic regimen:  Jordan Chan flextouch 100 unit inject 40 units into the skin daily Trulicity 8.1SY/5.4CY inject 4.5 mg once a week Metformin 1000 mg take 1 tab twice daily with meal  What recent interventions/DTPs have been made to improve glycemic control:  None  Have there been any recent hospitalizations or ED visits since last visit with CPP? No   Patient reports hypoglycemic symptoms, including None   Patient reports hyperglycemic symptoms, including none  How often are you checking your blood sugar? once daily typically after she eats.  What are your blood sugars ranging?  Fasting:  Before meals:  After meals: They vary between 220-280 depending on what she Bedtime:   During the week, how often does your blood glucose drop below 70?  Pt states it's rare that her levels drop below 100  Are you checking your feet daily/regularly?   Pt states she checks her feet daily and PCP checked them today and everything is fine.  Adherence Review: Is the patient currently on a STATIN medication? Yes Is the patient currently on ACE/ARB medication? Yes Does the patient have >5 day gap between last estimated fill dates? No    Star Rating Drugs: atorvastatin (LIPITOR) 20 MG tablet last filled 01/24/21 90 DS lisinopril (ZESTRIL) 5 MG tablet last filled 01/29/21 90 DS  [place] Clinical Pharmacist Assistant [phone]

## 2021-04-29 NOTE — Assessment & Plan Note (Signed)
Chronic, stable with BP slightly on lower side today, but home readings at goal.  Recommend she continue to monitor BP at home regularly.  Focus on DASH diet.  Continue current medication regimen and adjust as needed, refills sent as needed.  CMP and TSH today.  Return in 3 months.

## 2021-04-29 NOTE — Assessment & Plan Note (Signed)
Acute, currently being followed by pulmonary.  Eliquis has been discontinued at this time.  Continue collaboration with pulmonary.

## 2021-04-29 NOTE — Assessment & Plan Note (Addendum)
Chronic, stable.  Recheck Vitamin D today and adjust supplement as needed. 

## 2021-04-29 NOTE — Assessment & Plan Note (Signed)
Chronic, ongoing, insulin dependent. Refer to insulin dependent diabetes plan.  Continue Gabapentin.

## 2021-04-30 ENCOUNTER — Telehealth: Payer: Self-pay

## 2021-04-30 LAB — COMPREHENSIVE METABOLIC PANEL
ALT: 11 IU/L (ref 0–32)
AST: 14 IU/L (ref 0–40)
Albumin/Globulin Ratio: 1.4 (ref 1.2–2.2)
Albumin: 4.2 g/dL (ref 3.7–4.7)
Alkaline Phosphatase: 70 IU/L (ref 44–121)
BUN/Creatinine Ratio: 18 (ref 12–28)
BUN: 16 mg/dL (ref 8–27)
Bilirubin Total: 0.2 mg/dL (ref 0.0–1.2)
CO2: 28 mmol/L (ref 20–29)
Calcium: 9.5 mg/dL (ref 8.7–10.3)
Chloride: 95 mmol/L — ABNORMAL LOW (ref 96–106)
Creatinine, Ser: 0.9 mg/dL (ref 0.57–1.00)
Globulin, Total: 2.9 g/dL (ref 1.5–4.5)
Glucose: 236 mg/dL — ABNORMAL HIGH (ref 70–99)
Potassium: 5.6 mmol/L — ABNORMAL HIGH (ref 3.5–5.2)
Sodium: 135 mmol/L (ref 134–144)
Total Protein: 7.1 g/dL (ref 6.0–8.5)
eGFR: 68 mL/min/{1.73_m2} (ref >59)

## 2021-04-30 LAB — VITAMIN D 25 HYDROXY (VIT D DEFICIENCY, FRACTURES): Vit D, 25-Hydroxy: 41.8 ng/mL (ref 30.0–100.0)

## 2021-04-30 LAB — LIPID PANEL W/O CHOL/HDL RATIO
Cholesterol, Total: 141 mg/dL (ref 100–199)
HDL: 50 mg/dL (ref >39)
LDL Chol Calc (NIH): 65 mg/dL (ref 0–99)
Triglycerides: 151 mg/dL — ABNORMAL HIGH (ref 0–149)
VLDL Cholesterol Cal: 26 mg/dL (ref 5–40)

## 2021-04-30 LAB — TSH: TSH: 2.59 u[IU]/mL (ref 0.450–4.500)

## 2021-04-30 LAB — VITAMIN B12: Vitamin B-12: 508 pg/mL (ref 232–1245)

## 2021-04-30 NOTE — Telephone Encounter (Signed)
-----   Message from [name], NP sent at 04/30/2021  8:51 AM EDT ----- Good morning crew, please let Shylo know her labs have returned: - Kidney function and liver function remain stable - Potassium on labs is a little elevated this check, I would like her to stop taking Lisinopril at this time as this can increase potassium levels and work on lowering potassium in diet by decreasing foods like bananas, potatoes, mangoes, broccoli.  I would like you to schedule lab visit only to recheck this in one week please. - Remainder of labs, including cholesterol, Vitamin D, thyroid, and Vitamin B12 are normal.  Any questions? Keep being amazing!!  Thank you for allowing me to participate in your care.  I appreciate you. Kindest regards, Jolene

## 2021-04-30 NOTE — Progress Notes (Signed)
Good morning crew, please let [name] know her labs have returned: - Kidney function and liver function remain stable - Potassium on labs is a little elevated this check, I would like her to stop taking Lisinopril at this time as this can increase potassium levels and work on lowering potassium in diet by decreasing foods like bananas, potatoes, mangoes, broccoli.  I would like you to schedule lab visit only to recheck this in one week please. - Remainder of labs, including cholesterol, Vitamin D, thyroid, and Vitamin B12 are normal.  Any questions? Keep being amazing!!  Thank you for allowing me to participate in your care.  I appreciate you. Kindest regards, Kyro Joswick

## 2021-04-30 NOTE — Telephone Encounter (Signed)
Informed patient of results and recommendations.  Follow up lab scheduled.

## 2021-04-30 NOTE — Addendum Note (Signed)
Addended by: [name] T on: 04/30/2021 08:52 AM   Modules accepted: Orders

## 2021-05-01 LAB — MPL MUTATION ANALYSIS

## 2021-05-02 ENCOUNTER — Ambulatory Visit (INDEPENDENT_AMBULATORY_CARE_PROVIDER_SITE_OTHER): Payer: Medicare HMO | Admitting: Licensed Clinical Social Worker

## 2021-05-02 DIAGNOSIS — J449 Chronic obstructive pulmonary disease, unspecified: Secondary | ICD-10-CM

## 2021-05-02 DIAGNOSIS — F324 Major depressive disorder, single episode, in partial remission: Secondary | ICD-10-CM

## 2021-05-02 DIAGNOSIS — I152 Hypertension secondary to endocrine disorders: Secondary | ICD-10-CM

## 2021-05-02 DIAGNOSIS — E1159 Type 2 diabetes mellitus with other circulatory complications: Secondary | ICD-10-CM

## 2021-05-03 DIAGNOSIS — E1159 Type 2 diabetes mellitus with other circulatory complications: Secondary | ICD-10-CM | POA: Diagnosis not present

## 2021-05-03 DIAGNOSIS — F324 Major depressive disorder, single episode, in partial remission: Secondary | ICD-10-CM

## 2021-05-03 DIAGNOSIS — J449 Chronic obstructive pulmonary disease, unspecified: Secondary | ICD-10-CM | POA: Diagnosis not present

## 2021-05-03 DIAGNOSIS — I152 Hypertension secondary to endocrine disorders: Secondary | ICD-10-CM

## 2021-05-05 DIAGNOSIS — J45909 Unspecified asthma, uncomplicated: Secondary | ICD-10-CM | POA: Diagnosis not present

## 2021-05-05 DIAGNOSIS — J449 Chronic obstructive pulmonary disease, unspecified: Secondary | ICD-10-CM | POA: Diagnosis not present

## 2021-05-06 NOTE — Patient Instructions (Signed)
Visit Information   Goals Addressed             This Visit's Progress    SW-Track and Manage My Symptoms-Depression   On track    Timeframe:  Long-Range Goal Priority:  Medium Start Date: 10/01/20                        Expected End Date:  09/03/21                    Follow Up Date- 08/15/21   Patient Self Care Activities:  Attend all scheduled provider appointments Utilize strategies discussed to assist in the management of symptoms Contact office with any questions or concerns        Patient verbalizes understanding of instructions provided today.   Telephone follow up appointment with care management team member scheduled for:08/15/21  Christa See, MSW, [email or web address] Phone [phone] 8:55 AM

## 2021-05-06 NOTE — Chronic Care Management (AMB) (Signed)
Chronic Care Management    Clinical Social Work Note  05/06/2021 Name: Jordan Chan MRN: [id number] DOB: 05/22/1951  Jordan Chan is a 70 y.o. year old female who is a primary care patient of [name], [name], NP. The CCM team was consulted to assist the patient with chronic disease management and/or care coordination needs related to: Mental Health Counseling and Resources.   Engaged with patient by telephone for follow up visit in response to provider referral for social work chronic care management and care coordination services.   Consent to Services:  The patient was given information about Chronic Care Management services, agreed to services, and gave verbal consent prior to initiation of services.  Please see initial visit note for detailed documentation.   Patient agreed to services and consent obtained.   Consent to Services:  The patient was given information about Care Management services, agreed to services, and gave verbal consent prior to initiation of services.  Please see initial visit note for detailed documentation.   Patient agreed to services today and consent obtained.   Assessment: Engaged with patient by phone in response to provider referral for social work care coordination services: [place] and Resources.    Patient continues to maintain positive progress with care plan goals. Symptoms of depression have decreased, as evidenced by recent phq9 scores from recent PCP visit. Strategies regarding self-care and stress management discussed. See Care Plan below for interventions and patient self-care activities.  Recent life changes or stressors: Management of health conditions  Recommendation: Patient may benefit from, and is in agreement work with LCSW to address care coordination needs and will continue to work with the clinical team to address health care and disease management related needs.   Follow up Plan: Patient would like  continued follow-up from CCM LCSW .  per patient's request will follow up in 08/15/21.  Will call office if needed prior to next encounter.  SDOH (Social Determinants of Health) assessments and interventions performed:  NA  Advanced Directives Status: Not addressed in this encounter.  CCM Care Plan  Allergies  Allergen Reactions   Other Palpitations    IV steroids   Pain Patch [Menthol] Anaphylaxis   Fentanyl    Avelox [Moxifloxacin Hcl In Nacl] Other (See Comments)    Upset stomach   Doxycycline Diarrhea   Erythromycin Nausea Only and Other (See Comments)    Can take a Z-Pak just fine Other reaction(s): Other (See Comments) Can take Z-Pak  Can take a Z-Pak just fine   Moxifloxacin Hcl Other (See Comments)    Upset stomach Upset stomach   Oxycontin [Oxycodone] Hives   Ozempic [Semaglutide] Nausea Only    Outpatient Encounter Medications as of 05/02/2021  Medication Sig   Accu-Chek Softclix Lancets lancets TEST BLOOD SUGAR THREE TIMES DAILY   albuterol (PROVENTIL HFA;VENTOLIN HFA) 108 (90 BASE) MCG/ACT inhaler Inhale 2 puffs into the lungs every 6 (six) hours as needed for wheezing or shortness of breath.    Alcohol Swabs (B-D SINGLE USE SWABS REGULAR) PADS USE TWICE DAILY  WITH  SUGAR  CHECKS   atorvastatin (LIPITOR) 20 MG tablet Take 1 tablet (20 mg total) by mouth daily.   Blood Glucose Monitoring Suppl (TRUE METRIX METER) w/Device KIT Use to check blood sugar 4 times a day   cholecalciferol (VITAMIN D3) 25 MCG (1000 UNIT) tablet Take 1,000 Units by mouth daily.   Cyanocobalamin 1000 MCG/ML KIT Inject 1,000 mcg as directed every 30 (thirty) days.  cyclobenzaprine (FLEXERIL) 5 MG tablet Take 5 mg by mouth 2 (two) times daily as needed for muscle spasms. Takes very rarely   diclofenac (VOLTAREN) 50 MG EC tablet TAKE 1 TABLET THREE TIMES DAILY   Dulaglutide (TRULICITY) 4.5 SW/9.6PR SOPN Inject 4.5 mg as directed once a week.   folic acid (FOLVITE) 1 MG tablet TAKE 1 TABLET  EVERY DAY   furosemide (LASIX) 40 MG tablet TAKE 1 TABLET (40 MG TOTAL) BY MOUTH DAILY.   gabapentin (NEURONTIN) 800 MG tablet Take 1 tablet (800 mg total) by mouth 3 (three) times daily.   glucose blood (TRUE METRIX BLOOD GLUCOSE TEST) test strip To check blood sugar 3 times a day.   insulin degludec (TRESIBA FLEXTOUCH) 100 UNIT/ML FlexTouch Pen Inject 50 Units into the skin daily.   lactulose (CHRONULAC) 10 GM/15ML solution Take 45 mLs (30 g total) by mouth daily as needed for mild constipation or severe constipation.   metFORMIN (GLUCOPHAGE) 1000 MG tablet Take 1 tablet (1,000 mg total) by mouth 2 (two) times daily with a meal.   metoprolol succinate (TOPROL-XL) 25 MG 24 hr tablet Take 0.5 tablets (12.5 mg total) by mouth daily.   montelukast (SINGULAIR) 10 MG tablet Take 10 mg by mouth at bedtime.   PAIN MANAGEMENT INTRATHECAL, IT, PUMP 1 each by Intrathecal route. Intrathecal (IT) medication:  Morphine Patient does not remember current. Adjusted every 2.5 months at [facility] in [place].   TRELEGY ELLIPTA 100-62.5-25 MCG/INH AEPB INHALE 1 PUFF INTO THE LUNGS DAILY.   venlafaxine XR (EFFEXOR-XR) 150 MG 24 hr capsule Take 1 capsule (150 mg total) by mouth daily.   [START ON 07/25/2021] Zoster Vaccine Adjuvanted [facility]) injection Inject 0.5 mLs into the muscle once for 1 dose. Dose # 2   No facility-administered encounter medications on file as of 05/02/2021.    Patient Active Problem List   Diagnosis Date Noted   Chronic HFrEF (heart failure with reduced ejection fraction) ([place]) 01/18/2021   Iron deficiency anemia 12/28/2020   Type 2 diabetes, controlled, with neuropathy ([place]) 07/20/2020   Insulin dependent type 2 diabetes mellitus ([place]) 07/20/2020   SOB (shortness of breath) 07/04/2020   History of pulmonary embolus (PE) 07/04/2020   Aortic atherosclerosis ([place]) 07/01/2020   SVT (supraventricular tachycardia) ([place])    Vitamin D deficiency 07/17/2019   History of  non-ST elevation myocardial infarction (NSTEMI)    Presence of intrathecal pump 04/27/2017   Advanced care planning/counseling discussion 11/14/2016   Cervical scoliosis 05/20/2016   Hypertension associated with diabetes ([place]) 06/18/2015   B12 deficiency 03/19/2015   Sleep apnea 11/16/2014   Allergic rhinitis 11/16/2014   History of breast cancer 11/16/2014   GERD (gastroesophageal reflux disease) 11/16/2014   COPD, moderate ([place]) 11/16/2014   Obesity 11/16/2014   Depression, major, single episode, in partial remission ([place]) 11/16/2014   Insomnia 11/16/2014   Hyperlipidemia associated with type 2 diabetes mellitus ([place]) 11/16/2014   History of sarcoma 09/27/2013   Chronic pain syndrome 09/30/2012    Conditions to be addressed/monitored: Depression and Chronic Pain; Mental Health Concerns   Care Plan : General Social Work (Adult)  Updates made by Rebekah Chesterfield, LCSW since 05/06/2021 12:00 AM     Problem: Coping Skills (General Plan of Care)      Long-Range Goal: Coping Skills Enhanced   Start Date: 10/01/2020  This Visit's Progress: On track  Recent Progress: On track  Priority: Medium  Note:   Timeframe:  Long-Range Goal Priority:  Medium  Start Date: 10/01/20                        Expected End Date:  09/03/21                    Follow Up Date- 08/15/21   -Current Barriers:  Financial constraints  Limited social support Level of care concerns ADL IADL limitations Social Isolation Depression Clinical Social Work Clinical Goal(s):  Over the next 120 days, patient/caregiver will work with SW to address concerns related to lack of support/resource connection. LCSW will assist patient in gaining additional support/resource connection and community resource education in order to maintain health and mental health appropriately   Over the next 120 days, patient will demonstrate improved adherence to self care as evidenced by implementing healthy self-care into her daily  routine such as: attending all medical appointments, deep breathing exercises, taking time for self and self-reflection, taking medications as prescribed, drinking water and daily exercise to improve mobility.   Over the next 120 days, patient will demonstrate improved health management independence as evidenced by implementing healthy self-care skills and positive support/resources into her daily routine to help cope with stressors and improve overall physical and mental health and well-being  Interventions: Patient interviewed and appropriate assessments performed Patient reports depression and anxiety symptoms are managed well. Per chart review, patient's recent phq9 decreased from 11 to 8. Patient continues to participate in med management through PCP Patient shared that she ensures to obtain rest when she is experiencing pain/discomfort from neck and back. Patient also utilizes morphine pump, when needed 09/29: Patient was successful in identifying stressors. Strategies to assist with stress management and prioritizing self-care discussed. Patient practices gratitude thinking Patient continues to receive strong support from spouse CCM LCSW reviewed upcoming appointments Assessment for social determinants of health completed Patient reports feeling overwhelmed with chronic health conditions, stating "I have so much going on" Patient was recently diagnosed with CHF. Recently patient's Hematologist found patient's folic acid and iron to be low resulting in her having scheduled iron infusions starting today Patient reports feelings of anxiety regarding scheduled infusions due to having small veins that have a hx of exploding during injections. CCM LCSW discussed strategies to assist in grounding and management of anxiety and/or depression Patient agreed to playing solitaire on phone to assist with anxiety management Patient has a strong support system Patient reports being very stable and declines any  urgent concerns or needs at this time.  Discussed plans with patient for ongoing care management follow up and provided patient with direct contact information for care management team Patient Self Care Activities:  Attend all scheduled provider appointments Utilize strategies discussed to assist in the management of symptoms Contact office with any questions or concerns      Christa See, MSW, [email or web address] Phone [phone] 8:52 AM

## 2021-05-07 ENCOUNTER — Other Ambulatory Visit: Payer: Medicare HMO

## 2021-05-07 ENCOUNTER — Other Ambulatory Visit: Payer: Self-pay

## 2021-05-07 DIAGNOSIS — E875 Hyperkalemia: Secondary | ICD-10-CM

## 2021-05-08 LAB — POTASSIUM: Potassium: 4.8 mmol/L (ref 3.5–5.2)

## 2021-05-08 NOTE — Progress Notes (Signed)
Good morning please let Jordan Chan know labs have returned and potassium is improved with her off Lisinopril, continue of this at this time and we will recheck next visit and consider restart at very low dose for kidney protection.  Have a great day!! Keep being amazing!!  Thank you for allowing me to participate in your care.  I appreciate you. Kindest regards, Aarini Slee

## 2021-05-10 ENCOUNTER — Ambulatory Visit (INDEPENDENT_AMBULATORY_CARE_PROVIDER_SITE_OTHER): Payer: Medicare HMO

## 2021-05-10 ENCOUNTER — Telehealth: Payer: Self-pay

## 2021-05-10 ENCOUNTER — Other Ambulatory Visit: Payer: Self-pay

## 2021-05-10 DIAGNOSIS — E538 Deficiency of other specified B group vitamins: Secondary | ICD-10-CM

## 2021-05-10 MED ORDER — CYANOCOBALAMIN 1000 MCG/ML IJ SOLN
1000.0000 ug | Freq: Once | INTRAMUSCULAR | Status: AC
Start: 2021-05-10 — End: 2021-05-10
  Administered 2021-05-10: 1000 ug via INTRAMUSCULAR

## 2021-05-10 NOTE — Progress Notes (Signed)
B12 injection given today in right deltoid.  Patient tolerated well.

## 2021-05-10 NOTE — Chronic Care Management (AMB) (Signed)
Chronic Care Management Pharmacy Assistant   Name: MARIS ABASCAL  MRN: [id number] DOB: November 11, 1950  Jordan Chan is an 70 y.o. year old female who presents for her follow-up CCM visit with the clinical pharmacist.  Reason for Encounter: Disease State   Recent office visits:  05/10/21 Elizabeth Palau CMA- pt seen for B12 Deficiency. Pt received Cyanocobalamin 1000 MG in office. No follow up noted.  Recent consult visits:  None noted  Hospital visits:  None in previous 6 months  Medications: Outpatient Encounter Medications as of 05/10/2021  Medication Sig   Accu-Chek Softclix Lancets lancets TEST BLOOD SUGAR THREE TIMES DAILY   albuterol (PROVENTIL HFA;VENTOLIN HFA) 108 (90 BASE) MCG/ACT inhaler Inhale 2 puffs into the lungs every 6 (six) hours as needed for wheezing or shortness of breath.    Alcohol Swabs (B-D SINGLE USE SWABS REGULAR) PADS USE TWICE DAILY  WITH  SUGAR  CHECKS   atorvastatin (LIPITOR) 20 MG tablet Take 1 tablet (20 mg total) by mouth daily.   Blood Glucose Monitoring Suppl (TRUE METRIX METER) w/Device KIT Use to check blood sugar 4 times a day   cholecalciferol (VITAMIN D3) 25 MCG (1000 UNIT) tablet Take 1,000 Units by mouth daily.   Cyanocobalamin 1000 MCG/ML KIT Inject 1,000 mcg as directed every 30 (thirty) days.   cyclobenzaprine (FLEXERIL) 5 MG tablet Take 5 mg by mouth 2 (two) times daily as needed for muscle spasms. Takes very rarely   diclofenac (VOLTAREN) 50 MG EC tablet TAKE 1 TABLET THREE TIMES DAILY   Dulaglutide (TRULICITY) 4.5 GQ/9.1QX SOPN Inject 4.5 mg as directed once a week.   folic acid (FOLVITE) 1 MG tablet TAKE 1 TABLET EVERY DAY   furosemide (LASIX) 40 MG tablet TAKE 1 TABLET (40 MG TOTAL) BY MOUTH DAILY.   gabapentin (NEURONTIN) 800 MG tablet Take 1 tablet (800 mg total) by mouth 3 (three) times daily.   glucose blood (TRUE METRIX BLOOD GLUCOSE TEST) test strip To check blood sugar 3 times a day.   insulin degludec (TRESIBA  FLEXTOUCH) 100 UNIT/ML FlexTouch Pen Inject 50 Units into the skin daily.   lactulose (CHRONULAC) 10 GM/15ML solution Take 45 mLs (30 g total) by mouth daily as needed for mild constipation or severe constipation.   metFORMIN (GLUCOPHAGE) 1000 MG tablet Take 1 tablet (1,000 mg total) by mouth 2 (two) times daily with a meal.   metoprolol succinate (TOPROL-XL) 25 MG 24 hr tablet Take 0.5 tablets (12.5 mg total) by mouth daily.   montelukast (SINGULAIR) 10 MG tablet Take 10 mg by mouth at bedtime.   PAIN MANAGEMENT INTRATHECAL, IT, PUMP 1 each by Intrathecal route. Intrathecal (IT) medication:  Morphine Patient does not remember current. Adjusted every 2.5 months at [facility] in [place].   TRELEGY ELLIPTA 100-62.5-25 MCG/INH AEPB INHALE 1 PUFF INTO THE LUNGS DAILY.   venlafaxine XR (EFFEXOR-XR) 150 MG 24 hr capsule Take 1 capsule (150 mg total) by mouth daily.   [START ON 07/25/2021] Zoster Vaccine Adjuvanted [facility]) injection Inject 0.5 mLs into the muscle once for 1 dose. Dose # 2   No facility-administered encounter medications on file as of 05/10/2021.    Recent Relevant Labs: Lab Results  Component Value Date/Time   HGBA1C 8.8 (H) 04/29/2021 09:42 AM   HGBA1C 7.7 (H) 01/25/2021 08:57 AM   HGBA1C 7.7% 04/28/2016 12:00 AM   MICROALBUR 10 10/25/2020 08:43 AM   MICROALBUR 10 11/15/2019 09:38 AM    Kidney Function Lab Results  Component Value Date/Time   CREATININE 0.90 04/29/2021 09:44 AM   CREATININE 0.65 01/11/2021 02:29 PM   CREATININE 0.68 09/14/2013 07:42 PM   GFRNONAA >60 01/11/2021 02:29 PM   GFRNONAA >60 09/14/2013 07:42 PM   GFRAA 92 07/26/2020 09:02 AM   GFRAA >60 09/14/2013 07:42 PM    Current antihyperglycemic regimen:  Metformin Trulicity Tyler Aas  What recent interventions/DTPs have been made to improve glycemic control:  None  Have there been any recent hospitalizations or ED visits since last visit with CPP? No   Patient denies  hypoglycemic symptoms, including None  Patient reports hyperglycemic symptoms, including fatigue  How often are you checking your blood sugar? twice daily and in the morning before eating or drinking  What are your blood sugars ranging?  Fasting:  Before meals: 120-150 After meals: 230-280 Bedtime:  During the week, how often does your blood glucose drop below 70? Never  Are you checking your feet daily/regularly?   Adherence Review: Is the patient currently on a STATIN medication? Yes Is the patient currently on ACE/ARB medication? No Does the patient have >5 day gap between last estimated fill dates? No    Star Rating Drugs:  atorvastatin (LIPITOR) 20 MG tablet last fill 04/29/21 90 DS  [place] Clinical Pharmacist Assistant [phone]

## 2021-05-11 ENCOUNTER — Other Ambulatory Visit: Payer: Self-pay | Admitting: Nurse Practitioner

## 2021-05-13 ENCOUNTER — Other Ambulatory Visit: Payer: Self-pay | Admitting: Physician Assistant

## 2021-05-14 ENCOUNTER — Other Ambulatory Visit: Payer: Self-pay | Admitting: Nurse Practitioner

## 2021-05-14 MED ORDER — DICLOFENAC SODIUM 50 MG PO TBEC: 50.0000 mg | DELAYED_RELEASE_TABLET | Freq: Three times a day (TID) | ORAL | 0 refills | Status: DC

## 2021-05-14 MED ORDER — TRUE METRIX METER W/DEVICE KIT: PACK | 4 refills | Status: AC

## 2021-05-15 DIAGNOSIS — G8929 Other chronic pain: Secondary | ICD-10-CM | POA: Diagnosis not present

## 2021-05-15 DIAGNOSIS — M5442 Lumbago with sciatica, left side: Secondary | ICD-10-CM | POA: Diagnosis not present

## 2021-05-15 DIAGNOSIS — G894 Chronic pain syndrome: Secondary | ICD-10-CM | POA: Diagnosis not present

## 2021-05-15 DIAGNOSIS — G893 Neoplasm related pain (acute) (chronic): Secondary | ICD-10-CM | POA: Diagnosis not present

## 2021-05-15 DIAGNOSIS — M542 Cervicalgia: Secondary | ICD-10-CM | POA: Diagnosis not present

## 2021-05-16 ENCOUNTER — Other Ambulatory Visit: Payer: Self-pay | Admitting: Nurse Practitioner

## 2021-05-17 NOTE — Telephone Encounter (Signed)
Requested medications are due for refill today NO  Requested medications are on the active medication list Dose inconsistent with current med list  Last refill 06/17/21  Last visit 04/29/21  Future visit scheduled 07/31/21  Notes to clinic Historical Provider  Requested Prescriptions  Pending Prescriptions Disp Refills   TRESIBA FLEXTOUCH 100 UNIT/ML FlexTouch Pen [Pharmacy Med Name: TRESIBA FLEXTOUCH 100 UNIT/ML Solution Pen-injector] 30 mL     Sig: INJECT 30 UNITS INTO THE SKIN AT BEDTIME     Endocrinology:  Diabetes - Insulins Failed - 05/16/2021 11:44 AM      Failed - HBA1C is between 0 and 7.9 and within 180 days    Hemoglobin A1C  Date Value Ref Range Status  04/28/2016 7.7%  Final   HB A1C (BAYER DCA - WAIVED)  Date Value Ref Range Status  04/29/2021 8.8 (H) 4.8 - 5.6 % Final    Comment:             Prediabetes: 5.7 - 6.4          Diabetes: >6.4          Glycemic control for adults with diabetes: <7.0               **Please note reference interval change**           Passed - Valid encounter within last 6 months    Recent Outpatient Visits           2 weeks ago Type 2 diabetes, controlled, with neuropathy ([place])   [place], Jolene T, NP   3 months ago Type 2 diabetes, controlled, with neuropathy ([place])   [place], Jolene T, NP   6 months ago Insulin dependent type 2 diabetes mellitus ([place])   [place], Jolene T, NP   9 months ago Type 2 diabetes, controlled, with neuropathy ([place])   [place], Deerfield T, NP   1 year ago Suspected COVID-19 virus infection   [place], Dale, DO       Future Appointments             In 1 month [place], Areta Haber, PA-C [place]   In 2 months [place], [name], NP [employer]   In 7 months  [employer]

## 2021-05-29 ENCOUNTER — Telehealth: Payer: Medicare HMO

## 2021-06-03 ENCOUNTER — Telehealth: Payer: Self-pay

## 2021-06-03 ENCOUNTER — Telehealth: Payer: Medicare HMO

## 2021-06-03 NOTE — Telephone Encounter (Signed)
  Chronic Care Management  Outreach Note   Name: Jordan Chan MRN: [id number] DOB: 28-Nov-1950  Referred by: [name], NP Reason for referral: Telephone Appointment with Clinical Pharmacist, Madelin Rear.   An unsuccessful telephone outreach was attempted today. The patient was referred to the pharmacist for assistance with care management and care coordination.   Telephone appointment with clinical pharmacist today (06/03/2021) at 1pm. If patient immediately returns call, transfer to [id number]  Madelin Rear, PharmD Clinical Pharmacist  [place] [phone]

## 2021-06-05 DIAGNOSIS — J45909 Unspecified asthma, uncomplicated: Secondary | ICD-10-CM | POA: Diagnosis not present

## 2021-06-05 DIAGNOSIS — J449 Chronic obstructive pulmonary disease, unspecified: Secondary | ICD-10-CM | POA: Diagnosis not present

## 2021-06-06 NOTE — Telephone Encounter (Signed)
Hey Edison Nasuti  Looks like a f/u was scheduled by Richmond Campbell on 05/10/2021 for 07/15/2021, does this 06/03/2021 f/u need to be rescheduled?  Thank you  Noreene Larsson, [place], [place], [place] [address] Direct Dial: [phone] [name]

## 2021-06-17 ENCOUNTER — Other Ambulatory Visit: Payer: Self-pay

## 2021-06-17 ENCOUNTER — Ambulatory Visit (INDEPENDENT_AMBULATORY_CARE_PROVIDER_SITE_OTHER): Payer: Medicare HMO

## 2021-06-17 DIAGNOSIS — Z23 Encounter for immunization: Secondary | ICD-10-CM | POA: Diagnosis not present

## 2021-06-17 DIAGNOSIS — E538 Deficiency of other specified B group vitamins: Secondary | ICD-10-CM

## 2021-06-17 MED ORDER — CYANOCOBALAMIN 1000 MCG/ML IJ SOLN
1000.0000 ug | Freq: Once | INTRAMUSCULAR | Status: AC
  Administered 2021-06-17: 1000 ug via INTRAMUSCULAR

## 2021-06-26 ENCOUNTER — Encounter: Payer: Self-pay | Admitting: Nurse Practitioner

## 2021-06-26 ENCOUNTER — Ambulatory Visit (INDEPENDENT_AMBULATORY_CARE_PROVIDER_SITE_OTHER): Payer: Medicare HMO | Admitting: Nurse Practitioner

## 2021-06-26 ENCOUNTER — Other Ambulatory Visit: Payer: Self-pay

## 2021-06-26 VITALS — BP 113/70 | HR 86 | Temp 97.4°F | Wt 156.0 lb

## 2021-06-26 DIAGNOSIS — R8281 Pyuria: Secondary | ICD-10-CM | POA: Diagnosis not present

## 2021-06-26 DIAGNOSIS — R399 Unspecified symptoms and signs involving the genitourinary system: Secondary | ICD-10-CM | POA: Diagnosis not present

## 2021-06-26 LAB — MICROSCOPIC EXAMINATION

## 2021-06-26 LAB — URINALYSIS, ROUTINE W REFLEX MICROSCOPIC: Specific Gravity, UA: 1.02 (ref 1.005–1.030)

## 2021-06-26 LAB — WET PREP FOR TRICH, YEAST, CLUE
Clue Cell Exam: NEGATIVE
Trichomonas Exam: NEGATIVE
Yeast Exam: NEGATIVE

## 2021-06-26 MED ORDER — AMOXICILLIN-POT CLAVULANATE 875-125 MG PO TABS: 1.0000 | ORAL_TABLET | Freq: Two times a day (BID) | ORAL | 0 refills | Status: AC

## 2021-06-26 NOTE — Assessment & Plan Note (Signed)
Acute with UA showing 6-10 WBC and bacteria, discoloration due to Azo.  Will send for culture.  Start Augmentin, script sent.  If culture returns noting need for alternate abx will alert patient.  Recommend increased fluid intake at home and return to office for any ongoing or worsening.

## 2021-06-26 NOTE — Patient Instructions (Signed)

## 2021-06-26 NOTE — Progress Notes (Signed)
BP 113/70   Pulse 86   Temp (!) 97.4 F (36.3 C) (Oral)   Wt 156 lb (70.8 kg)   LMP  (LMP Unknown)   SpO2 97%   BMI 32.60 kg/m    Subjective:    [name], female    DOB: [birth year], 70 y.o.   MRN: [id number]  HPI: Jordan Chan is a 70 y.o. female  Chief Complaint  Patient presents with   Urinary Tract Infection    Patient states she started having symptoms on Sunday or Monday of burning with urination. Patient states she went to [facility] and states she tried AZO to help and she states it helped a little bit, but now she has irritation. Patient states she woke up this morning with the urge to use the bathroom and she said she barely made it to the bathroom.    URINARY SYMPTOMS Started with symptoms on Monday -- took Azo but not helping.  Last UTI 2-3 months ago, no treatment -- used Azo.  She does endorse constipation at baseline, no bowel regimen. Dysuria: burning Urinary frequency: yes Urgency: yes Small volume voids: yes Symptom severity: yes Urinary incontinence: yes Foul odor: no Hematuria: no Abdominal pain: no Back pain: chronic issue Suprapubic pain/pressure: yes Flank pain: no Fever:  no Vomiting: no Relief with cranberry juice: no Relief with pyridium: no Status: stable Previous urinary tract infection: yes Recurrent urinary tract infection: no Sexual activity: No sexually active History of sexually transmitted disease: no Treatments attempted: cranberry and increasing fluids    Relevant past medical, surgical, family and social history reviewed and updated as indicated. Interim medical history since our last visit reviewed. Allergies and medications reviewed and updated.  Review of Systems  Constitutional:  Negative for activity change, appetite change, diaphoresis, fatigue and fever.  Respiratory:  Negative for cough, chest tightness and shortness of breath.   Cardiovascular:  Negative for chest pain, palpitations and leg  swelling.  Gastrointestinal:  Negative for abdominal distention, abdominal pain, constipation, diarrhea, nausea and vomiting.  Genitourinary:  Positive for decreased urine volume, dysuria, frequency and urgency. Negative for hematuria and vaginal discharge.  Neurological: Negative.   Psychiatric/Behavioral: Negative.     Per HPI unless specifically indicated above     Objective:    BP 113/70   Pulse 86   Temp (!) 97.4 F (36.3 C) (Oral)   Wt 156 lb (70.8 kg)   LMP  (LMP Unknown)   SpO2 97%   BMI 32.60 kg/m   Wt Readings from Last 3 Encounters:  06/26/21 156 lb (70.8 kg)  04/29/21 156 lb 9.6 oz (71 kg)  03/29/21 157 lb (71.2 kg)    Physical Exam Vitals and nursing note reviewed.  Constitutional:      General: She is awake. She is not in acute distress.    Appearance: She is well-developed and well-groomed. She is not ill-appearing.  HENT:     Head: Normocephalic.     Right Ear: Hearing normal.     Left Ear: Hearing normal.  Eyes:     General: Lids are normal.        Right eye: No discharge.        Left eye: No discharge.     Conjunctiva/sclera: Conjunctivae normal.     Pupils: Pupils are equal, round, and reactive to light.  Neck:     Thyroid: No thyromegaly.     Vascular: No carotid bruit.  Cardiovascular:     Rate  and Rhythm: Normal rate and regular rhythm.     Heart sounds: Normal heart sounds. No murmur heard.   No gallop.  Pulmonary:     Effort: Pulmonary effort is normal. No accessory muscle usage or respiratory distress.     Breath sounds: Normal breath sounds.     Comments:   Abdominal:     General: Bowel sounds are normal.     Palpations: Abdomen is soft.  Musculoskeletal:     Cervical back: Normal range of motion and neck supple.     Right lower leg: No edema.     Left lower leg: No edema.  Lymphadenopathy:     Cervical: No cervical adenopathy.  Skin:    General: Skin is warm and dry.  Neurological:     Mental Status: She is alert and oriented  to person, place, and time.  Psychiatric:        Attention and Perception: Attention normal.        Mood and Affect: Mood normal.        Speech: Speech normal.        Behavior: Behavior normal. Behavior is cooperative.        Thought Content: Thought content normal.    Results for orders placed or performed in visit on 05/07/21  Potassium  Result Value Ref Range   Potassium 4.8 3.5 - 5.2 mmol/L      Assessment & Plan:   Problem List Items Addressed This Visit       Other   Urinary symptom or sign - Primary    Acute with UA showing 6-10 WBC and bacteria, discoloration due to Azo.  Will send for culture.  Start Augmentin, script sent.  If culture returns noting need for alternate abx will alert patient.  Recommend increased fluid intake at home and return to office for any ongoing or worsening.      Relevant Orders   Urinalysis, Routine w reflex microscopic   WET PREP FOR TRICH, YEAST, CLUE   Other Visit Diagnoses     Pyuria       Urine for culture.   Relevant Orders   Urine Culture        Follow up plan: Return if symptoms worsen or fail to improve.

## 2021-06-27 NOTE — Progress Notes (Signed)
Cardiology Office Note    Date:  07/03/2021   ID:  Karrie, Fluellen 01-12-1951, MRN [id number]  PCP:  [name], NP  Cardiologist:  [name], MD  Electrophysiologist:  None   Chief Complaint: Follow-up  History of Present Illness:   Jordan Chan is a 70 y.o. female with history of stress-induced cardiomyopathy in 04/2017 in the setting of pain pump malfunction and opioid withdrawal, PE following hospitalization for PNA in the summer of 2021 treated with Eliquis, anemia/leukocytosis/thrombocytosis followed by hematology, chronic low back pain, DM, COPD, sleep apnea intolerant to CPAP on nocturnal oxygen, IBS, and GERD who presents for follow up of her cardiomyopathy.    Echo in 04/2017 showed an EF of 25-30%, severe hypokinesis of the mid and apical myocardium suggestive of stress-induced cardiomyopathy, mild left atrial enlargement, and a poorly visualized RV that was grossly normal in size.  LHC at that time showed no angiographically significant CAD with a severely reduced LVEF estimated at 25 to 30% with mid and apical akinesis consistent with stress-induced cardiomyopathy.  Repeat echo in 05/2017, through [facility], showed an improving LV systolic function with an EF of 45 to 50%, global hypokinesis, normal LV size and wall thickness, grade 1 diastolic dysfunction, and normal RV systolic function and ventricular cavity size.  Limited echo in 08/2017 showed a low normal LV systolic function with an EF of 50 to 55%.  Echo in 07/2020 showed an EF of 45 to 50%, global hypokinesis, grade 2 diastolic dysfunction, normal RV systolic function and ventricular cavity size, no pericardial effusion, no significant valvular abnormalities, and a normal CVP.     She was seen in 12/2020 and reported feeling short of breath and fatigue "all of the time since January."  D-dimer was elevated with subsequent CTA chest in the ED being negative for PE.  Echo from 12/2020, performed in  the context of feeling short of breath and fatigued showed an EF of 45% with global hypokinesis, grade 2 diastolic dysfunction, normal RV systolic function and ventricular cavity size, no pericardial effusion, and no significant valvular abnormalities.   She was seen in the office in 01/2021 and continued to feel about the same, possibly slightly better when compared to her visit 1 month prior.  She had been diagnosed with anemia and was started on folate as well as iron infusions.  She continued to report shortness of breath with mild activity and with talking for extended time frames.  She had a chronic cough.  She had no chest pain or palpitations.  She noted minimal lower extremity edema.  At times, she reported feeling off balance as if she was "walking sideways."  It was noted her lisinopril had been decreased to 2.5 mg in the setting of low blood pressure.  Her cardiomyopathy was felt to not completely explain her symptoms of fatigue and shortness of breath.  She appeared euvolemic.  There was low suspicion for ischemic heart disease driving her symptoms given LHC as outlined above.   She was seen in the [facility] ED on 01/11/2021 with reports of diarrhea and ongoing shortness of breath.  High-sensitivity troponin was negative.  EKG showed sinus rhythm without acute ST-T changes.  CT of the abdomen/pelvis showed diverticulosis without diverticulitis.  She was last seen in the office in 03/2021 and was doing well from a cardiac perspective.  Her main complaint at that time was continued fatigue, which she attributed to sleeping poorly, with known sleep apnea and  intolerance to CPAP.  With noted soft BP, Toprol-XL was decreased to 12.5 mg daily.  She comes in doing well from a cardiac perspective.  She does continue to note a stable longstanding fatigue, which she attributes to sleeping only approximately a couple hours per night with noted intolerance to CPAP.  She also notes chronic stable dyspnea.  No  angina, palpitations, dizziness, presyncope, or syncope.  Lisinopril was stopped in 04/2021 with noted hyperkalemia on labs obtained at that time, with no mention of hemolysis.  Recheck potassium, off lisinopril, was improved.  Her weight is down 12 pounds today when compared to her last clinic visit.  She continues to sleep in a recliner, secondary to nerve pain stemming from her prior mastectomy in the late 1990s.  No lower extremity swelling or abdominal distention.  She feels like she is doing reasonably well and stable.   Labs independently reviewed: 05/2021 - potassium 4.8 04/2021 - TSH normal, TC 141, TG 151, HDL 50, LDL 65, BUN 16, serum creatinine 0.9, albumin 4.2, AST/ALT normal, A1c 8.8  Past Medical History:  Diagnosis Date   Arthritis    Asthma    Breast cancer ([place]) 1998   right breast ca with mastectomy and chemotherapy and radiation   Bronchitis    CHF (congestive heart failure) ([place])    "with Morphine withdrawal"   COPD (chronic obstructive pulmonary disease) ([place])    Diabetes mellitus without complication ([place])    Diverticulitis    diverticulosis also   Dyspnea    Endometriosis    GERD (gastroesophageal reflux disease)    History of shingles 2000-2005   Hypercholesteremia    Hypertension    IBS (irritable bowel syndrome)    Low back pain    a. Implanted morphine/bupivicaine/clonidine pump.   Neuropathy    Orthopnea    Oxygen dependent    uses at night   Personal history of chemotherapy    Personal history of radiation therapy    Pneumonia    pneumonia 5-6 times, history of bronchitis also   Scoliosis    Sleep apnea    does not use cpap   Stroke ([place]) 2010   TIA, 10 years ago   Withdrawal from sedative drug ([place])    withdrawal from morphine when pump batteries died    Past Surgical History:  Procedure Laterality Date   ABDOMINAL HYSTERECTOMY  1987   BACK SURGERY     Tailbone removed following fracture   BREAST SURGERY Right    mastectomy   CARDIAC  CATHETERIZATION     CATARACT EXTRACTION W/PHACO Right 05/19/2019   Procedure: CATARACT EXTRACTION PHACO AND INTRAOCULAR LENS PLACEMENT ([place]), RIGHT, DIABETIC;  Surgeon: Marchia Meiers, MD;  Location: [facility] ORS;  Service: Ophthalmology;  Laterality: Right;  Lot # [id number] H [place]: 00:43.8 CDE: 4.59   CATARACT EXTRACTION W/PHACO Left 06/16/2019   Procedure: CATARACT EXTRACTION PHACO AND INTRAOCULAR LENS PLACEMENT ([place]) LEFT VISION BLUE DIABETIC;  Surgeon: Marchia Meiers, MD;  Location: [facility] ORS;  Service: Ophthalmology;  Laterality: Left;  Lot #[id number] H [place]: 00:46.9 CDE: 6.53   COCCYX REMOVAL     COLONOSCOPY WITH PROPOFOL N/A 01/01/2017   Procedure: COLONOSCOPY WITH PROPOFOL;  Surgeon: Jonathon Bellows, MD;  Location: [facility] ENDOSCOPY;  Service: Endoscopy;  Laterality: N/A;   ELBOW ARTHROSCOPY WITH TENDON RECONSTRUCTION     ESOPHAGOGASTRODUODENOSCOPY (EGD) WITH PROPOFOL N/A 01/01/2017   Procedure: ESOPHAGOGASTRODUODENOSCOPY (EGD) WITH PROPOFOL;  Surgeon: Jonathon Bellows, MD;  Location[facility]COPY;  Service: Endoscopy;  Laterality: N/A;   INTRATHECAL  PUMP IMPLANT     LEFT HEART CATH AND CORONARY ANGIOGRAPHY N/A 04/28/2017   Procedure: LEFT HEART CATH AND CORONARY ANGIOGRAPHY;  Surgeon: [name], MD;  Location: [facility] CV LAB;  Service: Cardiovascular;  Laterality: N/A;   MASTECTOMY Right 06/1997   morphine pump  2011   PLANTAR FASCIA RELEASE     TRIGGER FINGER RELEASE      Current Medications: Current Meds  Medication Sig   ACCU-CHEK AVIVA PLUS test strip TEST THREE TIMES DAILY   Accu-Chek Softclix Lancets lancets TEST BLOOD SUGAR THREE TIMES DAILY   albuterol (PROVENTIL HFA;VENTOLIN HFA) 108 (90 BASE) MCG/ACT inhaler Inhale 2 puffs into the lungs every 6 (six) hours as needed for wheezing or shortness of breath.    Alcohol Swabs (B-D SINGLE USE SWABS REGULAR) PADS USE TWICE DAILY  WITH  SUGAR  CHECKS   atorvastatin (LIPITOR) 20 MG tablet Take 1 tablet (20 mg total) by mouth daily.   Blood  Glucose Monitoring Suppl (TRUE METRIX METER) w/Device KIT Use to check blood sugar 4 times a day   cholecalciferol (VITAMIN D3) 25 MCG (1000 UNIT) tablet Take 1,000 Units by mouth daily.   Cyanocobalamin 1000 MCG/ML KIT Inject 1,000 mcg as directed every 30 (thirty) days.   cyclobenzaprine (FLEXERIL) 5 MG tablet Take 5 mg by mouth 2 (two) times daily as needed for muscle spasms. Takes very rarely   diclofenac (VOLTAREN) 50 MG EC tablet Take 1 tablet (50 mg total) by mouth 3 (three) times daily.   Dulaglutide (TRULICITY) 4.5 TD/4.2AJ SOPN Inject 4.5 mg as directed once a week.   folic acid (FOLVITE) 1 MG tablet TAKE 1 TABLET EVERY DAY   furosemide (LASIX) 40 MG tablet TAKE 1 TABLET (40 MG TOTAL) BY MOUTH DAILY.   gabapentin (NEURONTIN) 800 MG tablet Take 1 tablet (800 mg total) by mouth 3 (three) times daily.   lactulose (CHRONULAC) 10 GM/15ML solution Take 45 mLs (30 g total) by mouth daily as needed for mild constipation or severe constipation.   lisinopril (ZESTRIL) 2.5 MG tablet Take 1 tablet (2.5 mg total) by mouth at bedtime.   metFORMIN (GLUCOPHAGE) 1000 MG tablet Take 1 tablet (1,000 mg total) by mouth 2 (two) times daily with a meal.   metoprolol succinate (TOPROL-XL) 25 MG 24 hr tablet TAKE 1/2 TABLET EVERY DAY   montelukast (SINGULAIR) 10 MG tablet Take 10 mg by mouth at bedtime.   PAIN MANAGEMENT INTRATHECAL, IT, PUMP 1 each by Intrathecal route. Intrathecal (IT) medication:  Morphine Patient does not remember current. Adjusted every 2.5 months at [facility] in [place].   TRELEGY ELLIPTA 100-62.5-25 MCG/INH AEPB INHALE 1 PUFF INTO THE LUNGS DAILY.   TRESIBA FLEXTOUCH 100 UNIT/ML FlexTouch Pen INJECT 30 UNITS INTO THE SKIN AT BEDTIME   venlafaxine XR (EFFEXOR-XR) 150 MG 24 hr capsule Take 1 capsule (150 mg total) by mouth daily.   [START ON 07/25/2021] Zoster Vaccine Adjuvanted [facility]) injection Inject 0.5 mLs into the muscle once for 1 dose. Dose # 2     Allergies:   Other, Pain patch [menthol], Fentanyl, Avelox [moxifloxacin hcl in nacl], Doxycycline, Erythromycin, Moxifloxacin hcl, Oxycontin [oxycodone], and Ozempic [semaglutide]   Social History   Socioeconomic History   Marital status: Unknown    Spouse name: Not on file   Number of children: Not on file   Years of education: Not on file   Highest education level: High school graduate  Occupational History   Occupation: retired  Tobacco Use  Smoking status: Former    Packs/day: 1.00    Years: 30.00    Pack years: 30.00    Types: Cigarettes    Quit date: 04/30/2004    Years since quitting: 17.1   Smokeless tobacco: Never  Vaping Use   Vaping Use: Never used  Substance and Sexual Activity   Alcohol use: No   Drug use: No   Sexual activity: Not Currently    Birth control/protection: Post-menopausal  Other Topics Concern   Not on file  Social History Narrative   ** Merged History Encounter **       Social Determinants of Health   Financial Resource Strain: Low Risk    Difficulty of Paying Living Expenses: Not hard at all  Food Insecurity: No Food Insecurity   Worried About Charity fundraiser in the Last Year: Never true   Ran Out of Food in the Last Year: Never true  Transportation Needs: No Transportation Needs   Lack of Transportation (Medical): No   Lack of Transportation (Non-Medical): No  Physical Activity: Inactive   Days of Exercise per Week: 0 days   Minutes of Exercise per Session: 0 min  Stress: No Stress Concern Present   Feeling of Stress : Not at all  Social Connections: Not on file     Family History:  The patient's family history includes Arthritis in her paternal grandfather; Cancer in her paternal grandmother and sister; Cancer (age of onset: 56) in her father; Cancer (age of onset: 80) in her mother; Hyperlipidemia in her sister and son; Hypertension in her sister; Osteoporosis in her maternal grandmother; Seizures in her son; Thyroid  disease in her mother. There is no history of Breast cancer.  ROS:   Review of Systems  Constitutional:  Positive for malaise/fatigue. Negative for chills, diaphoresis, fever and weight loss.  HENT:  Negative for congestion.   Eyes:  Negative for discharge and redness.  Respiratory:  Positive for shortness of breath. Negative for cough, sputum production and wheezing.   Cardiovascular:  Negative for chest pain, palpitations, orthopnea, claudication, leg swelling and PND.  Gastrointestinal:  Negative for abdominal pain, heartburn, nausea and vomiting.  Musculoskeletal:  Negative for falls and myalgias.  Skin:  Negative for rash.  Neurological:  Negative for dizziness, tingling, tremors, sensory change, speech change, focal weakness, loss of consciousness and weakness.  Endo/Heme/Allergies:  Does not bruise/bleed easily.  Psychiatric/Behavioral:  Negative for substance abuse. The patient has insomnia. The patient is not nervous/anxious.   All other systems reviewed and are negative.   EKGs/Labs/Other Studies Reviewed:    Studies reviewed were summarized above. The additional studies were reviewed today:  2D echo 12/11/2020: 1. Left ventricular ejection fraction, by estimation, is 45%. The left  ventricle has mildly decreased function. The left ventricle demonstrates  global hypokinesis. Left ventricular diastolic parameters are consistent  with Grade II diastolic dysfunction  (pseudonormalization).   2. Right ventricular systolic function is normal. The right ventricular  size is normal.   3. The mitral valve is normal in structure. No evidence of mitral valve  regurgitation.   4. The aortic valve is tricuspid. Aortic valve regurgitation is not  visualized.   Comparison(s): EF 45-50%. __________  2D echo 07/25/2020: 1. Left ventricular ejection fraction, by estimation, is 45 to 50%. The  left ventricle has mildly decreased function. The left ventricle  demonstrates global  hypokinesis. Left ventricular diastolic parameters are  consistent with Grade II diastolic  dysfunction (pseudonormalization).   2. Right  ventricular systolic function is normal. The right ventricular  size is normal.   3. The mitral valve is normal in structure. No evidence of mitral valve  regurgitation.   4. The aortic valve is tricuspid. Aortic valve regurgitation is not  visualized.   5. The inferior vena cava is normal in size with greater than 50%  respiratory variability, suggesting right atrial pressure of 3 mmHg. ___________  Limited echo 08/13/2017: - Procedure narrative: LIMITED TTE to assess LVF.  - Left ventricle: The cavity size was normal. Wall thickness was    normal. Systolic function was normal. The estimated ejection    fraction was in the range of 50% to 55%. Wall motion was normal;    there were no regional wall motion abnormalities. The study is    not technically sufficient to allow evaluation of LV diastolic    function.   Impressions:   - Significant improvement in EF since last echo. __________  [facility] 04/28/2017: Conclusions: No angiographically significant coronary artery disease. Severely reduced LV contraction (LVEF 25-30%) with mid and apical akinesis, consistent with Takotsubo (stress-induced) cardiomyopathy. Mildly elevated left ventricular filling pressure.   Recommendations: Medical therapy, including initiation of metoprolol succinate 12.5 mg daily and furosemide 20 mg IV daily. If blood pressure tolerates, consider addition of low-dose ACE inhibitor tomorrow. Continue treatment of pain and opioid withdrawal. __________  2D echo 04/28/2017: - Left ventricle: The cavity size was at the upper limits of    normal. Wall thickness was normal. Systolic function was severely    reduced. The estimated ejection fraction was in the range of 25%    to 30%. There is severe hypokinesis of the mid-apical myocardium.    The study is not technically  sufficient to allow evaluation of LV    diastolic function.  - Left atrium: The atrium was mildly dilated.  - Right ventricle: Poorly visualized. The cavity size was normal.   EKG:  EKG is ordered today.  The EKG ordered today demonstrates NSR, 94 bpm, incomplete LBBB, consistent with tracing dated 12/2020  Recent Labs: 12/07/2020: B Natriuretic Peptide 12.6 04/24/2021: Hemoglobin 12.8; Platelets 386 04/29/2021: ALT 11; BUN 16; Creatinine, Ser 0.90; Sodium 135; TSH 2.590 05/07/2021: Potassium 4.8  Recent Lipid Panel    Component Value Date/Time   CHOL 141 04/29/2021 0944   CHOL 127 07/26/2019 0817   TRIG 151 (H) 04/29/2021 0944   TRIG 104 07/26/2019 0817   HDL 50 04/29/2021 0944   VLDL 21 07/26/2019 0817   LDLCALC 65 04/29/2021 0944    PHYSICAL EXAM:    VS:  BP 120/70 (BP Location: Left Arm, Patient Position: Sitting, Cuff Size: Normal)   Pulse 94   Ht 4' 10.5" (1.486 m)   Wt 145 lb (65.8 kg)   LMP  (LMP Unknown)   SpO2 95%   BMI 29.79 kg/m   BMI: Body mass index is 29.79 kg/m.  Physical Exam Vitals reviewed.  Constitutional:      Appearance: She is well-developed.  HENT:     Head: Normocephalic and atraumatic.  Eyes:     General:        Right eye: No discharge.        Left eye: No discharge.  Neck:     Vascular: No JVD.  Cardiovascular:     Rate and Rhythm: Normal rate and regular rhythm.     Pulses:          Posterior tibial pulses are 2+ on the right side and  2+ on the left side.     Heart sounds: Normal heart sounds, S1 normal and S2 normal. Heart sounds not distant. No midsystolic click and no opening snap. No murmur heard.   No friction rub.  Pulmonary:     Effort: Pulmonary effort is normal. No respiratory distress.     Breath sounds: Normal breath sounds. No decreased breath sounds, wheezing or rales.  Chest:     Chest wall: No tenderness.  Abdominal:     General: There is no distension.     Palpations: Abdomen is soft.     Tenderness: There is no  abdominal tenderness.  Musculoskeletal:     Cervical back: Normal range of motion.     Right lower leg: No edema.     Left lower leg: No edema.  Skin:    General: Skin is warm and dry.     Nails: There is no clubbing.  Neurological:     Mental Status: She is alert and oriented to person, place, and time.  Psychiatric:        Speech: Speech normal.        Behavior: Behavior normal.        Thought Content: Thought content normal.        Judgment: Judgment normal.    Wt Readings from Last 3 Encounters:  07/03/21 145 lb (65.8 kg)  06/26/21 156 lb (70.8 kg)  04/29/21 156 lb 9.6 oz (71 kg)     ASSESSMENT & PLAN:   HFrEF secondary to stress-induced cardiomyopathy: She appears euvolemic and well compensated.  I do suspect her symptoms of fatigue cannot be directly attributed to her cardiomyopathy alone.  Fatigue is likely multifactorial including poor sleep hygiene with insomnia and intolerance to CPAP, obesity, underlying pulmonary disease, and physical deconditioning.  No symptoms concerning for angina at this time.  Given stable symptoms, ischemic testing continues to be deferred.  Should symptoms worsen this can be revisited at that time.  She remains on a low-dose Toprol-XL 12.5 mg and furosemide 40 mg daily with recent stable renal function and potassium.  She remains off lisinopril given recent hyperkalemia noted while on ACE inhibitor.  She reports prior intolerance to SGLT2i, with noted severe nausea and vomiting.  In this setting, we will defer addition of SGLT2i at this time.  Given recent hyperkalemia, defer rechallenge of ACE inhibitor, and addition of MRA and Entresto.  CHF education.  Anemia: Followed by hematology.  COPD: No acute exacerbation.  Followed by pulmonology.  History of PE: Status posttreatment with Eliquis.  No longer on [place].  Obesity with sleep apnea: Continued weight loss is encouraged.  Intolerant to CPAP.  On nocturnal oxygen.  Likely contributing to her  overall presentation of fatigue.  Follow-up with pulmonology as directed.   Disposition: F/u with Dr. Saunders Revel or an APP in 3 months.   Medication Adjustments/Labs and Tests Ordered: Current medicines are reviewed at length with the patient today.  Concerns regarding medicines are outlined above. Medication changes, Labs and Tests ordered today are summarized above and listed in the Patient Instructions accessible in Encounters.   Signed, [name], PA-C 07/03/2021 9:57 AM     [place] [address] [place] [address] [place], [place] [address] [phone]

## 2021-06-30 LAB — URINE CULTURE

## 2021-06-30 NOTE — Progress Notes (Signed)
Please let Jordan Chan know her urine returned showing no growth or infection, if she has any antibiotic left she can stop taking this and ensure drinking plenty of water at home.

## 2021-07-03 ENCOUNTER — Encounter: Payer: Self-pay | Admitting: Physician Assistant

## 2021-07-03 ENCOUNTER — Ambulatory Visit (INDEPENDENT_AMBULATORY_CARE_PROVIDER_SITE_OTHER): Payer: Medicare HMO | Admitting: Physician Assistant

## 2021-07-03 ENCOUNTER — Other Ambulatory Visit: Payer: Self-pay

## 2021-07-03 ENCOUNTER — Telehealth: Payer: Self-pay

## 2021-07-03 VITALS — BP 120/70 | HR 94 | Ht 58.5 in | Wt 145.0 lb

## 2021-07-03 DIAGNOSIS — G4730 Sleep apnea, unspecified: Secondary | ICD-10-CM

## 2021-07-03 DIAGNOSIS — E669 Obesity, unspecified: Secondary | ICD-10-CM

## 2021-07-03 DIAGNOSIS — I5181 Takotsubo syndrome: Secondary | ICD-10-CM

## 2021-07-03 DIAGNOSIS — E66811 Obesity, class 1: Secondary | ICD-10-CM

## 2021-07-03 DIAGNOSIS — R5383 Other fatigue: Secondary | ICD-10-CM

## 2021-07-03 DIAGNOSIS — I152 Hypertension secondary to endocrine disorders: Secondary | ICD-10-CM | POA: Diagnosis not present

## 2021-07-03 DIAGNOSIS — J42 Unspecified chronic bronchitis: Secondary | ICD-10-CM

## 2021-07-03 DIAGNOSIS — E1159 Type 2 diabetes mellitus with other circulatory complications: Secondary | ICD-10-CM | POA: Diagnosis not present

## 2021-07-03 DIAGNOSIS — Z6832 Body mass index (BMI) 32.0-32.9, adult: Secondary | ICD-10-CM

## 2021-07-03 DIAGNOSIS — D649 Anemia, unspecified: Secondary | ICD-10-CM | POA: Diagnosis not present

## 2021-07-03 DIAGNOSIS — I5022 Chronic systolic (congestive) heart failure: Secondary | ICD-10-CM | POA: Diagnosis not present

## 2021-07-03 DIAGNOSIS — Z86711 Personal history of pulmonary embolism: Secondary | ICD-10-CM

## 2021-07-03 MED ORDER — LISINOPRIL 2.5 MG PO TABS: 2.5000 mg | ORAL_TABLET | Freq: Every evening | ORAL | 3 refills | Status: DC

## 2021-07-03 NOTE — Patient Instructions (Signed)
Medication Instructions:  RESTART lisinopril 2.5 mg by mouth nightly.  *If you need a refill on your cardiac medications before your next appointment, please call your pharmacy*   Lab Work: None ordered  If you have labs (blood work) drawn today and your tests are completely normal, you will receive your results only by: [place] (if you have MyChart) OR A paper copy in the mail If you have any lab test that is abnormal or we need to change your treatment, we will call you to review the results.   Testing/Procedures: None ordered   Follow-Up: At [facility], you and your health needs are our priority.  As part of our continuing mission to provide you with exceptional heart care, we have created designated Provider Care Teams.  These Care Teams include your primary Cardiologist (physician) and Advanced Practice Providers (APPs -  Physician Assistants and Nurse Practitioners) who all work together to provide you with the care you need, when you need it.  We recommend signing up for the patient portal called "MyChart".  Sign up information is provided on this After Visit Summary.  MyChart is used to connect with patients for Virtual Visits (Telemedicine).  Patients are able to view lab/test results, encounter notes, upcoming appointments, etc.  Non-urgent messages can be sent to your provider as well.   To learn more about what you can do with MyChart, go to [email or web address].    Your next appointment:   3 month(s)  The format for your next appointment:   In Person  Provider:   You may see [name], MD or one of the following Advanced Practice Providers on your designated Care Team:   Murray Hodgkins, NP [name], PA-C Cadence Kathlen Mody, [place]

## 2021-07-03 NOTE — Telephone Encounter (Signed)
Patient seen today by [name], PA today. Patient was instructed to resume her Lisinopril 2.5 mg daily at bedtime.  Per Thurmond Butts after further review the patient should not resume Lisinopril at this time. Called the patient and made her aware. Patient verbalized understanding and voiced appreciation for the call.

## 2021-07-05 DIAGNOSIS — J45909 Unspecified asthma, uncomplicated: Secondary | ICD-10-CM | POA: Diagnosis not present

## 2021-07-05 DIAGNOSIS — J449 Chronic obstructive pulmonary disease, unspecified: Secondary | ICD-10-CM | POA: Diagnosis not present

## 2021-07-10 DIAGNOSIS — J449 Chronic obstructive pulmonary disease, unspecified: Secondary | ICD-10-CM | POA: Diagnosis not present

## 2021-07-15 ENCOUNTER — Other Ambulatory Visit: Payer: Self-pay | Admitting: Internal Medicine

## 2021-07-15 ENCOUNTER — Telehealth: Payer: Medicare HMO

## 2021-07-17 DIAGNOSIS — M542 Cervicalgia: Secondary | ICD-10-CM | POA: Diagnosis not present

## 2021-07-17 DIAGNOSIS — Z978 Presence of other specified devices: Secondary | ICD-10-CM | POA: Diagnosis not present

## 2021-07-17 DIAGNOSIS — M792 Neuralgia and neuritis, unspecified: Secondary | ICD-10-CM | POA: Diagnosis not present

## 2021-07-17 DIAGNOSIS — G893 Neoplasm related pain (acute) (chronic): Secondary | ICD-10-CM | POA: Diagnosis not present

## 2021-07-17 DIAGNOSIS — M5442 Lumbago with sciatica, left side: Secondary | ICD-10-CM | POA: Diagnosis not present

## 2021-07-18 ENCOUNTER — Encounter: Payer: Self-pay | Admitting: Emergency Medicine

## 2021-07-18 ENCOUNTER — Emergency Department
Admission: EM | Admit: 2021-07-18 | Discharge: 2021-07-18 | Disposition: A | Discharge status: Home / Self Care | Payer: Medicare HMO | Attending: Emergency Medicine | Admitting: Emergency Medicine

## 2021-07-18 ENCOUNTER — Emergency Department: Payer: Medicare HMO

## 2021-07-18 DIAGNOSIS — Z85831 Personal history of malignant neoplasm of soft tissue: Secondary | ICD-10-CM | POA: Diagnosis not present

## 2021-07-18 DIAGNOSIS — Z853 Personal history of malignant neoplasm of breast: Secondary | ICD-10-CM | POA: Insufficient documentation

## 2021-07-18 DIAGNOSIS — E1169 Type 2 diabetes mellitus with other specified complication: Secondary | ICD-10-CM | POA: Insufficient documentation

## 2021-07-18 DIAGNOSIS — J45909 Unspecified asthma, uncomplicated: Secondary | ICD-10-CM | POA: Diagnosis not present

## 2021-07-18 DIAGNOSIS — J449 Chronic obstructive pulmonary disease, unspecified: Secondary | ICD-10-CM | POA: Diagnosis not present

## 2021-07-18 DIAGNOSIS — Z7984 Long term (current) use of oral hypoglycemic drugs: Secondary | ICD-10-CM | POA: Diagnosis not present

## 2021-07-18 DIAGNOSIS — E1140 Type 2 diabetes mellitus with diabetic neuropathy, unspecified: Secondary | ICD-10-CM | POA: Insufficient documentation

## 2021-07-18 DIAGNOSIS — J101 Influenza due to other identified influenza virus with other respiratory manifestations: Secondary | ICD-10-CM | POA: Insufficient documentation

## 2021-07-18 DIAGNOSIS — Z20822 Contact with and (suspected) exposure to covid-19: Secondary | ICD-10-CM | POA: Diagnosis not present

## 2021-07-18 DIAGNOSIS — Z79899 Other long term (current) drug therapy: Secondary | ICD-10-CM | POA: Diagnosis not present

## 2021-07-18 DIAGNOSIS — E785 Hyperlipidemia, unspecified: Secondary | ICD-10-CM | POA: Insufficient documentation

## 2021-07-18 DIAGNOSIS — I509 Heart failure, unspecified: Secondary | ICD-10-CM | POA: Diagnosis not present

## 2021-07-18 DIAGNOSIS — Z87891 Personal history of nicotine dependence: Secondary | ICD-10-CM | POA: Diagnosis not present

## 2021-07-18 DIAGNOSIS — R911 Solitary pulmonary nodule: Secondary | ICD-10-CM | POA: Diagnosis not present

## 2021-07-18 DIAGNOSIS — I110 Hypertensive heart disease with heart failure: Secondary | ICD-10-CM | POA: Insufficient documentation

## 2021-07-18 DIAGNOSIS — R059 Cough, unspecified: Secondary | ICD-10-CM | POA: Diagnosis present

## 2021-07-18 LAB — RESP PANEL BY RT-PCR (FLU A&B, COVID) ARPGX2
Influenza A by PCR: POSITIVE — AB
Influenza B by PCR: NEGATIVE
SARS Coronavirus 2 by RT PCR: NEGATIVE

## 2021-07-18 MED ORDER — BENZONATATE 100 MG PO CAPS
100.0000 mg | ORAL_CAPSULE | Freq: Two times a day (BID) | ORAL | 0 refills | Status: AC | PRN
Start: 2021-07-18 — End: 2021-07-23

## 2021-07-18 MED ORDER — BENZONATATE 100 MG PO CAPS
100.0000 mg | ORAL_CAPSULE | Freq: Once | ORAL | Status: AC
  Administered 2021-07-18: 100 mg via ORAL
  Filled 2021-07-18: qty 1

## 2021-07-18 NOTE — ED Triage Notes (Signed)
Pt in with cough that began a few days ago, also having congestion. Reports diarrhea that began today, no abdominal pain or fevers. States she is coughing up green mucous

## 2021-07-18 NOTE — ED Provider Notes (Signed)
[facility] Emergency Department Provider Note  ____________________________________________   Event Date/Time   First MD Initiated Contact with Patient 07/18/21 2030     (approximate)  I have reviewed the triage vital signs and the nursing notes.   HISTORY  Chief Complaint Cough and Diarrhea   HPI Jordan Chan is a 70 y.o. female with history of stress-induced cardiomyopathy in 04/2017 in the setting of pain pump malfunction and opioid withdrawal, PE following hospitalization for PNA in the summer of 2021 treated with Eliquis, anemia/leukocytosis/thrombocytosis followed by hematology, chronic low back pain, DM, COPD, sleep apnea intolerant to CPAP on nocturnal oxygen, IBS, and GERD who presents for assessment of a couple days of productive cough with green mucus as well as congestion and some diarrhea that started today.  No fevers, shortness of breath, chest pain, back pain, headache, earache, sore throat, abdominal pain, urinary symptoms or blood in stool.  Patient does states she has had a very poor appetite today.  No rashes injuries or falls or any other acute concerns at this time.         Past Medical History:  Diagnosis Date   Arthritis    Asthma    Breast cancer ([place]) 1998   right breast ca with mastectomy and chemotherapy and radiation   Bronchitis    CHF (congestive heart failure) ([place])    "with Morphine withdrawal"   COPD (chronic obstructive pulmonary disease) ([place])    Diabetes mellitus without complication ([place])    Diverticulitis    diverticulosis also   Dyspnea    Endometriosis    GERD (gastroesophageal reflux disease)    History of shingles 2000-2005   Hypercholesteremia    Hypertension    IBS (irritable bowel syndrome)    Low back pain    a. Implanted morphine/bupivicaine/clonidine pump.   Neuropathy    Orthopnea    Oxygen dependent    uses at night   Personal history of chemotherapy    Personal history of radiation  therapy    Pneumonia    pneumonia 5-6 times, history of bronchitis also   Scoliosis    Sleep apnea    does not use cpap   Stroke ([place]) 2010   TIA, 10 years ago   Withdrawal from sedative drug ([place])    withdrawal from morphine when pump batteries died    Patient Active Problem List   Diagnosis Date Noted   Urinary symptom or sign 06/26/2021   Chronic HFrEF (heart failure with reduced ejection fraction) ([place]) 01/18/2021   Iron deficiency anemia 12/28/2020   Type 2 diabetes, controlled, with neuropathy ([place]) 07/20/2020   Insulin dependent type 2 diabetes mellitus ([place]) 07/20/2020   SOB (shortness of breath) 07/04/2020   History of pulmonary embolus (PE) 07/04/2020   Aortic atherosclerosis ([place]) 07/01/2020   SVT (supraventricular tachycardia) ([place])    Vitamin D deficiency 07/17/2019   History of non-ST elevation myocardial infarction (NSTEMI)    Presence of intrathecal pump 04/27/2017   Advanced care planning/counseling discussion 11/14/2016   Cervical scoliosis 05/20/2016   Hypertension associated with diabetes ([place]) 06/18/2015   B12 deficiency 03/19/2015   Sleep apnea 11/16/2014   Allergic rhinitis 11/16/2014   History of breast cancer 11/16/2014   GERD (gastroesophageal reflux disease) 11/16/2014   COPD, moderate ([place]) 11/16/2014   Obesity 11/16/2014   Depression, major, single episode, in partial remission ([place]) 11/16/2014   Insomnia 11/16/2014   Hyperlipidemia associated with type 2 diabetes mellitus ([place]) 11/16/2014   History  of sarcoma 09/27/2013   Chronic pain syndrome 09/30/2012    Past Surgical History:  Procedure Laterality Date   ABDOMINAL HYSTERECTOMY  1987   BACK SURGERY     Tailbone removed following fracture   BREAST SURGERY Right    mastectomy   CARDIAC CATHETERIZATION     CATARACT EXTRACTION W/PHACO Right 05/19/2019   Procedure: CATARACT EXTRACTION PHACO AND INTRAOCULAR LENS PLACEMENT ([place]), RIGHT, DIABETIC;  Surgeon: Marchia Meiers, MD;  Location:  [facility] ORS;  Service: Ophthalmology;  Laterality: Right;  Lot # [id number] H [place]: 00:43.8 CDE: 4.59   CATARACT EXTRACTION W/PHACO Left 06/16/2019   Procedure: CATARACT EXTRACTION PHACO AND INTRAOCULAR LENS PLACEMENT ([place]) LEFT VISION BLUE DIABETIC;  Surgeon: Marchia Meiers, MD;  Location: [facility] ORS;  Service: Ophthalmology;  Laterality: Left;  Lot #[id number] H [place]: 00:46.9 CDE: 6.53   COCCYX REMOVAL     COLONOSCOPY WITH PROPOFOL N/A 01/01/2017   Procedure: COLONOSCOPY WITH PROPOFOL;  Surgeon: Jonathon Bellows, MD;  Location: [facility] ENDOSCOPY;  Service: Endoscopy;  Laterality: N/A;   ELBOW ARTHROSCOPY WITH TENDON RECONSTRUCTION     ESOPHAGOGASTRODUODENOSCOPY (EGD) WITH PROPOFOL N/A 01/01/2017   Procedure: ESOPHAGOGASTRODUODENOSCOPY (EGD) WITH PROPOFOL;  Surgeon: Jonathon Bellows, MD;  Location: [facility] ENDOSCOPY;  Service: Endoscopy;  Laterality: N/A;   INTRATHECAL PUMP IMPLANT     LEFT HEART CATH AND CORONARY ANGIOGRAPHY N/A 04/28/2017   Procedure: LEFT HEART CATH AND CORONARY ANGIOGRAPHY;  Surgeon: [name], MD;  Location: [facility];  Service: Cardiovascular;  Laterality: N/A;   MASTECTOMY Right 06/1997   morphine pump  2011   PLANTAR FASCIA RELEASE     TRIGGER FINGER RELEASE      Prior to Admission medications   Medication Sig Start Date End Date Taking? Authorizing Provider  ACCU-CHEK AVIVA PLUS test strip TEST THREE TIMES DAILY 05/11/21   [name], Henrine Screws T, NP  Accu-Chek Softclix Lancets lancets TEST BLOOD SUGAR THREE TIMES DAILY 01/30/21   [name], Henrine Screws T, NP  albuterol (PROVENTIL HFA;VENTOLIN HFA) 108 (90 BASE) MCG/ACT inhaler Inhale 2 puffs into the lungs every 6 (six) hours as needed for wheezing or shortness of breath.     [provider]  Alcohol Swabs (B-D SINGLE USE SWABS REGULAR) PADS USE TWICE DAILY  WITH  SUGAR  CHECKS 01/16/21   [name], Jolene T, NP  atorvastatin (LIPITOR) 20 MG tablet Take 1 tablet (20 mg total) by mouth daily. 04/29/21   [name], Henrine Screws T, NP  Blood Glucose  Monitoring Suppl (TRUE METRIX METER) w/Device KIT Use to check blood sugar 4 times a day 05/14/21   [name], Henrine Screws T, NP  cholecalciferol (VITAMIN D3) 25 MCG (1000 UNIT) tablet Take 1,000 Units by mouth daily.    [provider]  Cyanocobalamin 1000 MCG/ML KIT Inject 1,000 mcg as directed every 30 (thirty) days.    [provider]  cyclobenzaprine (FLEXERIL) 5 MG tablet Take 5 mg by mouth 2 (two) times daily as needed for muscle spasms. Takes very rarely    [provider]  diclofenac (VOLTAREN) 50 MG EC tablet Take 1 tablet (50 mg total) by mouth 3 (three) times daily. 05/14/21   [name], Henrine Screws T, NP  Dulaglutide (TRULICITY) 4.5 HY/8.6VH SOPN Inject 4.5 mg as directed once a week. 10/25/20   [name] T, NP  folic acid (FOLVITE) 1 MG tablet TAKE 1 TABLET EVERY DAY 02/08/21   Sindy Guadel[place], MD  furosemide (LASIX) 40 MG tablet TAKE 1 TABLET EVERY DAY 07/15/21   End, Harrell Gave, MD  gabapentin (NEURONTIN) 800  MG tablet Take 1 tablet (800 mg total) by mouth 3 (three) times daily. 10/25/20   [name], Henrine Screws T, NP  lactulose (CHRONULAC) 10 GM/15ML solution Take 45 mLs (30 g total) by mouth daily as needed for mild constipation or severe constipation. 12/20/19   Loletha Grayer, MD  metFORMIN (GLUCOPHAGE) 1000 MG tablet Take 1 tablet (1,000 mg total) by mouth 2 (two) times daily with a meal. 10/25/20   [name], Henrine Screws T, NP  metoprolol succinate (TOPROL-XL) 25 MG 24 hr tablet TAKE 1/2 TABLET EVERY DAY 05/13/21   Dunn, Areta Haber, PA-C  montelukast (SINGULAIR) 10 MG tablet Take 10 mg by mouth at bedtime.    [provider]  PAIN MANAGEMENT INTRATHECAL, IT, PUMP 1 each by Intrathecal route. Intrathecal (IT) medication:  Morphine Patient does not remember current. Adjusted every 2.5 months at [facility] in [place].    [provider]  TRELEGY ELLIPTA 100-62.5-25 MCG/INH AEPB INHALE 1 PUFF INTO THE LUNGS DAILY. 12/03/20   [name], Jolene T, NP   TRESIBA FLEXTOUCH 100 UNIT/ML FlexTouch Pen INJECT 30 UNITS INTO THE SKIN AT BEDTIME 05/17/21   [name], Jolene T, NP  venlafaxine XR (EFFEXOR-XR) 150 MG 24 hr capsule Take 1 capsule (150 mg total) by mouth daily. 10/22/20   [name] T, NP  Zoster Vaccine Adjuvant[facility]) injection Inject 0.5 mLs into the muscle once for 1 dose. Dose # 2 07/25/21 07/25/21  [name] T, NP    Allergies Other, Pain patch [menthol], Fentanyl, Avelox [moxifloxacin hcl in nacl], Doxycycline, Erythromycin, Moxifloxacin hcl, Oxycontin [oxycodone], and Ozempic [semaglutide]  Family History  Problem Relation Age of Onset   Cancer Mother 19       lung   Thyroid disease Mother    Cancer Father 57       Pancreatic   Hypertension Sister    Cancer Sister        "cancer on face"   Hyperlipidemia Sister    Osteoporosis Maternal Grandmother    Hyperlipidemia Son    Seizures Son    Cancer Paternal Grandmother        colon   Arthritis Paternal Grandfather    Breast cancer Neg Hx     Social History Social History   Tobacco Use   Smoking status: Former    Packs/day: 1.00    Years: 30.00    Pack years: 30.00    Types: Cigarettes    Quit date: 04/30/2004    Years since quitting: 17.2   Smokeless tobacco: Never  Vaping Use   Vaping Use: Never used  Substance Use Topics   Alcohol use: No   Drug use: No    Review of Systems  Review of Systems  Constitutional:  Negative for chills and fever.  HENT:  Negative for sore throat.   Eyes:  Negative for pain.  Respiratory:  Positive for cough. Negative for stridor.   Cardiovascular:  Negative for chest pain.  Gastrointestinal:  Positive for diarrhea. Negative for vomiting.  Skin:  Negative for rash.  Neurological:  Negative for seizures, loss of consciousness and headaches.  Psychiatric/Behavioral:  Negative for suicidal ideas.   All other systems reviewed and are negative.    ____________________________________________   PHYSICAL  EXAM:  VITAL SIGNS: ED Triage Vitals  Enc Vitals Group     BP 07/18/21 2026 132/72     Pulse Rate 07/18/21 2026 93     Resp 07/18/21 2026 20     Temp 07/18/21 2026 98 F (  36.7 C)     Temp Source 07/18/21 2026 Oral     SpO2 07/18/21 2026 97 %     Weight 07/18/21 2028 156 lb (70.8 kg)     Height --      Head Circumference --      Peak Flow --      Pain Score --      Pain Loc --      Pain Edu? --      Excl. in [place]? --    Vitals:   07/18/21 2026  BP: 132/72  Pulse: 93  Resp: 20  Temp: 98 F (36.7 C)  SpO2: 97%   Physical Exam Vitals and nursing note reviewed.  Constitutional:      General: She is not in acute distress.    Appearance: She is well-developed.  HENT:     Head: Normocephalic and atraumatic.     Right Ear: External ear normal.     Left Ear: External ear normal.     Nose: Nose normal.  Eyes:     Conjunctiva/sclera: Conjunctivae normal.  Cardiovascular:     Rate and Rhythm: Normal rate and regular rhythm.     Heart sounds: No murmur heard. Pulmonary:     Effort: Pulmonary effort is normal. No respiratory distress.     Breath sounds: Examination of the left-middle field reveals rhonchi. Examination of the left-lower field reveals rhonchi. Rhonchi present.  Abdominal:     Palpations: Abdomen is soft.     Tenderness: There is no abdominal tenderness.  Musculoskeletal:        General: No swelling.     Cervical back: Neck supple.  Skin:    General: Skin is warm and dry.     Capillary Refill: Capillary refill takes less than 2 seconds.  Neurological:     Mental Status: She is alert.  Psychiatric:        Mood and Affect: Mood normal.     ____________________________________________   LABS (all labs ordered are listed, but only abnormal results are displayed)  Labs Reviewed  RESP PANEL BY RT-PCR (FLU A&B, COVID) ARPGX2 - Abnormal; Notable for the following components:      Result Value   Influenza A by PCR POSITIVE (*)    All other components within  normal limits   ____________________________________________  EKG  ____________________________________________  RADIOLOGY  ED MD interpretation: Chest x-ray without focal consolidation, effusion, edema, pneumothorax or other clear acute process.  Some stable right upper lobe nodules but no other acute process.  Official radiology report(s): DG Chest 2 View  Result Date: 07/18/2021 CLINICAL DATA:  Cough and congestion. EXAM: CHEST - 2 VIEW COMPARISON:  PA Lat 01/11/2021. FINDINGS: The cardiac size is normal. There are calcifications of the transverse aorta. The lungs show mild chronic changes. There is asymmetric right apical pleuroparenchymal scarring and calcified granulomas and small scattered subcentimeter nodules unchanged in gross appearance in the right upper lobe. No mass or infiltrate is seen. There is no pleural effusion. Osteopenia and thoracic spondylosis. IMPRESSION: No active cardiopulmonary disease. Grossly stable scattered subcentimeter right upper lobe nodules. Chronic change. Electronically Signed   By: Telford Nab M.D.   On: 07/18/2021 20:46    ____________________________________________   PROCEDURES  Procedure(s) performed (including Critical Care):  Procedures   ____________________________________________   INITIAL IMPRESSION / ASSESSMENT AND PLAN / ED COURSE      Patient presents with above-stated history exam for assessment of productive cough for the last 2 or 3  days associate with some diarrhea today.  No associated fevers, chest pain, shortness of breath abdominal pain, back pain, urinary symptoms nausea or vomiting.  On arrival patient is afebrile hemodynamically stable.  No fevers measured at home.  No significant wheezing on exam for suggest a COPD exacerbation although some rhonchi in the left and concern for bacterial pneumonia versus about bronchitis.  It seems patient also has developed a component of enteritis.  Given absence of fever or any  tenderness of her abdomen above very low suspicion at this time for appendicitis, diverticulitis, pancreatitis, cystitis, kidney stone or other emergent intra-abdominal process.  Given absence of any chest pain or shortness of breath or hypoxia.  Low suspicion for PE.  She is denying any chest pain or shortness of breath to suggest ACS or myocarditis event at this time.  She otherwise appears fairly well and does not appear septic or meningitic.  Chest x-ray without evidence of pneumonia or other acute process.  Influenza A PCR is positive.  I think this is the etiology of patient's symptoms.  Given stable vitals with otherwise reassuring exam I think she is stable for discharge with outpatient follow-up.  Discharged in stable condition.  Strict return precautions advised and discussed.         ____________________________________________   FINAL CLINICAL IMPRESSION(S) / ED DIAGNOSES  Final diagnoses:  Influenza A    Medications  benzonatate (TESSALON) capsule 100 mg (100 mg Oral Given 07/18/21 2106)     ED Discharge Orders     None        Note:  This document was prepared using Dragon voice recognition software and may include unintentional dictation errors.    Lucrezia Starch, MD 07/18/21 2133

## 2021-07-19 ENCOUNTER — Other Ambulatory Visit: Payer: Self-pay | Admitting: Nurse Practitioner

## 2021-07-19 NOTE — Telephone Encounter (Signed)
Pt called saying she needs a refill on the Trulicity.  She said Her mail order pharmacy said it was on back order and she would like to have it sent to the [facility] on [address].  CB#  [phone]

## 2021-07-19 NOTE — Telephone Encounter (Signed)
See note- needs Rx sent to local pharmacy

## 2021-07-20 MED ORDER — TRULICITY 4.5 MG/0.5ML ~~LOC~~ SOAJ: 4.5000 mg | SUBCUTANEOUS | 2 refills | Status: DC

## 2021-07-20 NOTE — Telephone Encounter (Signed)
Requested Prescriptions  Pending Prescriptions Disp Refills   Dulaglutide (TRULICITY) 4.5 JO/8.7OM SOPN 6 mL 2    Sig: Inject 4.5 mg as directed once a week.     Endocrinology:  Diabetes - GLP-1 Receptor Agonists Failed - 07/19/2021  3:16 PM      Failed - HBA1C is between 0 and 7.9 and within 180 days    Hemoglobin A1C  Date Value Ref Range Status  04/28/2016 7.7%  Final   HB A1C (BAYER DCA - WAIVED)  Date Value Ref Range Status  04/29/2021 8.8 (H) 4.8 - 5.6 % Final    Comment:             Prediabetes: 5.7 - 6.4          Diabetes: >6.4          Glycemic control for adults with diabetes: <7.0               **Please note reference interval change**          Passed - Valid encounter within last 6 months    Recent Outpatient Visits          3 weeks ago Urinary symptom or sign   [facility] [place], Jolene T, NP   2 months ago Type 2 diabetes, controlled, with neuropathy ([place])   [place], Jolene T, NP   5 months ago Type 2 diabetes, controlled, with neuropathy ([place])   [place], Jolene T, NP   8 months ago Insulin dependent type 2 diabetes mellitus ([place])   [place], Jolene T, NP   11 months ago Type 2 diabetes, controlled, with neuropathy ([place])   [place], [name], NP      Future Appointments            In 1 week [name], [name], NP [employer]   In 2 months End, Harrell Gave, [place]   In 4 months  [employer] [facility]

## 2021-07-22 ENCOUNTER — Inpatient Hospital Stay: Payer: Medicare HMO

## 2021-07-22 ENCOUNTER — Telehealth: Payer: Self-pay | Admitting: Nurse Practitioner

## 2021-07-22 ENCOUNTER — Inpatient Hospital Stay: Payer: Medicare HMO | Admitting: Nurse Practitioner

## 2021-07-22 NOTE — Telephone Encounter (Signed)
Pt called to reschedule her appt for today. She has the flu. Call back at [phone]

## 2021-07-22 NOTE — Telephone Encounter (Signed)
FYI didn't know when you wanted her to come in

## 2021-07-23 ENCOUNTER — Other Ambulatory Visit: Payer: Medicare HMO

## 2021-07-23 ENCOUNTER — Ambulatory Visit: Payer: Medicare HMO | Admitting: Nurse Practitioner

## 2021-07-31 ENCOUNTER — Ambulatory Visit
Admission: RE | Admit: 2021-07-31 | Discharge: 2021-07-31 | Disposition: A | Discharge status: Home / Self Care | Payer: Medicare HMO | Source: Ambulatory Visit | Attending: Nurse Practitioner | Admitting: Nurse Practitioner

## 2021-07-31 ENCOUNTER — Ambulatory Visit (INDEPENDENT_AMBULATORY_CARE_PROVIDER_SITE_OTHER): Payer: Medicare HMO | Admitting: Nurse Practitioner

## 2021-07-31 ENCOUNTER — Other Ambulatory Visit: Payer: Self-pay

## 2021-07-31 ENCOUNTER — Encounter: Payer: Self-pay | Admitting: Nurse Practitioner

## 2021-07-31 ENCOUNTER — Ambulatory Visit
Admission: RE | Admit: 2021-07-31 | Discharge: 2021-07-31 | Disposition: A | Discharge status: Home / Self Care | Payer: Medicare HMO | Attending: Nurse Practitioner | Admitting: Nurse Practitioner

## 2021-07-31 VITALS — BP 108/62 | HR 89 | Temp 98.4°F | Ht 58.5 in | Wt 152.2 lb

## 2021-07-31 DIAGNOSIS — J189 Pneumonia, unspecified organism: Secondary | ICD-10-CM | POA: Insufficient documentation

## 2021-07-31 DIAGNOSIS — Z6831 Body mass index (BMI) 31.0-31.9, adult: Secondary | ICD-10-CM

## 2021-07-31 DIAGNOSIS — E1169 Type 2 diabetes mellitus with other specified complication: Secondary | ICD-10-CM | POA: Diagnosis not present

## 2021-07-31 DIAGNOSIS — E785 Hyperlipidemia, unspecified: Secondary | ICD-10-CM | POA: Diagnosis not present

## 2021-07-31 DIAGNOSIS — J449 Chronic obstructive pulmonary disease, unspecified: Secondary | ICD-10-CM

## 2021-07-31 DIAGNOSIS — G4733 Obstructive sleep apnea (adult) (pediatric): Secondary | ICD-10-CM

## 2021-07-31 DIAGNOSIS — E1159 Type 2 diabetes mellitus with other circulatory complications: Secondary | ICD-10-CM | POA: Diagnosis not present

## 2021-07-31 DIAGNOSIS — E119 Type 2 diabetes mellitus without complications: Secondary | ICD-10-CM

## 2021-07-31 DIAGNOSIS — I700 Atherosclerosis of aorta: Secondary | ICD-10-CM | POA: Diagnosis not present

## 2021-07-31 DIAGNOSIS — F324 Major depressive disorder, single episode, in partial remission: Secondary | ICD-10-CM

## 2021-07-31 DIAGNOSIS — G894 Chronic pain syndrome: Secondary | ICD-10-CM

## 2021-07-31 DIAGNOSIS — I5022 Chronic systolic (congestive) heart failure: Secondary | ICD-10-CM

## 2021-07-31 DIAGNOSIS — R059 Cough, unspecified: Secondary | ICD-10-CM | POA: Diagnosis not present

## 2021-07-31 DIAGNOSIS — Z794 Long term (current) use of insulin: Secondary | ICD-10-CM

## 2021-07-31 DIAGNOSIS — E1140 Type 2 diabetes mellitus with diabetic neuropathy, unspecified: Secondary | ICD-10-CM | POA: Diagnosis not present

## 2021-07-31 DIAGNOSIS — E66811 Obesity, class 1: Secondary | ICD-10-CM

## 2021-07-31 DIAGNOSIS — D508 Other iron deficiency anemias: Secondary | ICD-10-CM

## 2021-07-31 DIAGNOSIS — I471 Supraventricular tachycardia, unspecified: Secondary | ICD-10-CM

## 2021-07-31 DIAGNOSIS — I152 Hypertension secondary to endocrine disorders: Secondary | ICD-10-CM

## 2021-07-31 DIAGNOSIS — Z978 Presence of other specified devices: Secondary | ICD-10-CM

## 2021-07-31 DIAGNOSIS — R61 Generalized hyperhidrosis: Secondary | ICD-10-CM

## 2021-07-31 DIAGNOSIS — J1000 Influenza due to other identified influenza virus with unspecified type of pneumonia: Secondary | ICD-10-CM

## 2021-07-31 DIAGNOSIS — E6609 Other obesity due to excess calories: Secondary | ICD-10-CM

## 2021-07-31 LAB — BAYER DCA HB A1C WAIVED: HB A1C (BAYER DCA - WAIVED): 8 % — ABNORMAL HIGH (ref 4.8–5.6)

## 2021-07-31 MED ORDER — AMOXICILLIN-POT CLAVULANATE 875-125 MG PO TABS: 1.0000 | ORAL_TABLET | Freq: Two times a day (BID) | ORAL | 0 refills | Status: AC

## 2021-07-31 MED ORDER — PREDNISONE 20 MG PO TABS: 40.0000 mg | ORAL_TABLET | Freq: Every day | ORAL | 0 refills | Status: AC

## 2021-07-31 MED ORDER — AZITHROMYCIN 250 MG PO TABS: ORAL_TABLET | ORAL | 0 refills | Status: AC

## 2021-07-31 NOTE — Assessment & Plan Note (Signed)
Ongoing and stable since infusions.  Will continue to collaborate with hematology, recent notes and labs reviewed.  Will check CBC today due to report sweating intermittent.

## 2021-07-31 NOTE — Assessment & Plan Note (Addendum)
Recent Flu 07/18/21 with ongoing cough.  Obtained imaging and noted to have a LLL PNA present on review images and report.  At this time discussed with patient, will start Augmentin and Zpack + Prednisone for cough and inflammation.  She is higher risk, aware that if worsening to immediately go to ER, but goal is to treat outpatient, which is acceptable at this time based on CURB-65 = 1.  Return in 2 weeks, sooner if worsening.

## 2021-07-31 NOTE — Assessment & Plan Note (Signed)
BMI 31.27.  Recommended eating smaller high protein, low fat meals more frequently and exercising 30 mins a day 5 times a week with a goal of 10-15lb weight loss in the next 3 months. Patient voiced their understanding and motivation to adhere to these recommendations.  

## 2021-07-31 NOTE — Assessment & Plan Note (Signed)
Continue to collaborate with chronic pain clinic in [place].  Has morphine pump.   

## 2021-07-31 NOTE — Assessment & Plan Note (Signed)
Chronic, ongoing, insulin dependent. A1C 8.0% today, downward trend at this time.  Will continueTrulicity dose, 4.5 MG weekly, and continue Metformin at max dosing.  Increase Tresiba to 60 units and may need to add on meal insulin next visit.  Recommend she continue to monitor BS consistently at home and document + focus heavily on diet changes.  She is aware to notify provider if fasting BS >130 consistently, as may need to adjust insulin further.  In past poorly tolerated Jardiance -- discussed with her that and SGLT2 would benefit HF -- however concern for side effects.  Would benefit a [employer], may need to work on coverage for this. Return in 3 months.

## 2021-07-31 NOTE — Assessment & Plan Note (Signed)
Chronic, stable.  Denies SI/HI.  Mood well-controlled.  Continue current medication regimen and adjust as needed.  Refills as needed.  Consider trial of Duloxetine and transition off Effexor in future for dual pain and mood benefit.   

## 2021-07-31 NOTE — Assessment & Plan Note (Signed)
Chronic, ongoing, insulin dependent. Refer to insulin dependent diabetes plan.  Continue Gabapentin.

## 2021-07-31 NOTE — Progress Notes (Signed)
BP 108/62 (BP Location: Left Arm, Patient Position: Sitting, Cuff Size: Normal)    Pulse 89    Temp 98.4 F (36.9 C) (Oral)    Ht 4' 10.5" (1.486 m)    Wt 152 lb 3.2 oz (69 kg)    LMP  (LMP Unknown)    SpO2 94%    BMI 31.27 kg/m    Subjective:    [name], female    DOB: 1951-07-05, 70 y.o.   MRN: [id number]  HPI: Jordan Chan is a 70 y.o. female  Chief Complaint  Patient presents with   Diabetes   Hyperlipidemia   Hypertension   Pain   Congestive Heart Failure   COPD   Hot Flashes    Patient states she would like to discuss with provider of why she is having what seems to be like "hot flashes" and she states she will be drenched in sweats. Patient states it happens off and on, but it will start from her waist up and then her bottom half will be cool.    DIABETES Continues on Metformin 0768 MG BID, Trulicity 4.5 MG weekly, and Tresiba 50 units at HS.  Working with CCM team and provider. Last A1c was 8.8% in September after illness and steroid use -- we increased Tresiba to 50 units.  She has tried [place] in past and this made her sick.  Recently had the flu and elevated sugar levels with this.  Continues on monthly B12 shots for low levels.  Followed by hematology and last seen 01/22/21 and received 5 iron infusions total -- continues on folic acid.  Her WBC remains stable and if ever becomes elevated and continues to elevate then they will do bone marrow biopsy.  She is to return to see them in 6 months - returns 08/11/20.  Reports she has been having some random sweating, like hot flashes, for 6 months -- will happen intermittently, even while sitting -- gets soaking wet from waist to back of neck.  Can last 10-30 minutes.  No other symptoms present with this.  No low sugars with these episodes. Hypoglycemic episodes: none Polydipsia/polyuria: no Visual disturbance: no Chest pain: no Paresthesias: no Glucose Monitoring: yes  Accucheck frequency:  BID  Fasting glucose: 110 to 150  Post prandial: 180 to 200, sometimes a little more -- she will not give up her sweet tea  Evening:   Before meals: Taking Insulin?: yes  Long acting insulin: Tresiba 50 units  Short acting insulin: Blood Pressure Monitoring: occasional Retinal Examination: Up to Date Foot Exam: Up to Date Pneumovax: Up to Date Influenza: Up to Date Aspirin: yes   HYPERTENSION / HYPERLIPIDEMIA/CHF Continues on Atorvastatin 20 MG, Metoprolol 12.5 MG daily, Lasix 40 MG daily, + ASA.   Most recent EF 12/11/20 showed EF 45% with mildly decreased LV function.  Last saw cardiology on 07/03/21-- remains off Lisinopril due to hyperkalemia. Satisfied with current treatment? yes Duration of hypertension: chronic BP monitoring frequency: a few times a week BP range:  average 120-130/60 BP medication side effects: no Duration of hyperlipidemia: chronic Cholesterol medication side effects: no Cholesterol supplements: none Medication compliance: good compliance Aspirin: yes Recent stressors: no Recurrent headaches: no Visual changes: no Palpitations: no Dyspnea: baseline, no worsening Chest pain: no Lower extremity edema: no Dizzy/lightheaded: no   COPD Followed by pulmonary for COPD -- last saw 12/722.  Currently using Trelegy and Singulair.  Recently had the flu and continues to cough.  Has  a history of smoking, quit 15 years ago. She does not use CPAP, but continues to use O2 at night.  Currently off Eliquis, stopped in November 2021 -- history of PE.   COPD status: controlled Satisfied with current treatment?: yes Oxygen use: no Dyspnea frequency: none Cough frequency: none Rescue inhaler frequency:  minimal Limitation of activity: no Productive cough: none Last Spirometry: with pulmonary Pneumovax: Up to Date Influenza: Up to Date  CHRONIC PAIN: Followed by [facility] in [place], last seen 07/17/21 -- continues pump + Gabapentin.  Followed by  clinic nurse for pump check and replacement. Reports pain is well-controlled with current regimen -- at baseline for her.    Vitamin D on low side in past, taking supplement with recent levels improved.  No recent falls or fractures.  Normal DEXA 2017.  DEPRESSION Continues on Effexor 150 MG daily. Mood status: controlled Satisfied with current treatment?: yes Symptom severity: mild  Duration of current treatment : chronic Side effects: no Medication compliance: good compliance Psychotherapy/counseling: has gone in the past Depressed mood: yes Anxious mood: no Anhedonia: no Significant weight loss or gain: no Insomnia: none Fatigue: no Feelings of worthlessness or guilt: no Impaired concentration/indecisiveness: no Suicidal ideations: no Hopelessness: no Crying spells: no Depression screen [facility] 2/9 07/31/2021 04/29/2021 12/14/2020 10/25/2020 07/26/2020  Decreased Interest 0 0 0 0 0  Down, Depressed, Hopeless 1 1 2  0 0  PHQ - 2 Score 1 1 2  0 0  Altered sleeping 3 3 3 3  0  Tired, decreased energy 3 2 3 3  0  Change in appetite 2 2 3  0 0  Feeling bad or failure about yourself  0 0 0 0 0  Trouble concentrating 0 0 0 0 0  Moving slowly or fidgety/restless 0 0 0 0 0  Suicidal thoughts 0 0 0 0 0  PHQ-9 Score 9 8 11 6  0  Difficult doing work/chores - Somewhat difficult Somewhat difficult Not difficult at all Not difficult at all  Some recent data might be hidden   GAD 7 : Generalized Anxiety Score 07/31/2021 04/29/2021 01/17/2019 01/17/2019  Nervous, Anxious, on Edge 1 1 1 1   Control/stop worrying 0 1 0 0  Worry too much - different things 1 1 1 1   Trouble relaxing 0 1 1 1   Restless 0 0 0 0  Easily annoyed or irritable 0 0 1 1  Afraid - awful might happen 0 0 0 0  Total GAD 7 Score 2 4 4 4   Anxiety Difficulty - Not difficult at all Not difficult at all Not difficult at all     Relevant past medical, surgical, family and social history reviewed and updated as indicated. Interim  medical history since our last visit reviewed. Allergies and medications reviewed and updated.  Review of Systems  Constitutional:  Negative for activity change, appetite change, diaphoresis, fatigue and fever.  Respiratory:  Negative for cough, chest tightness and shortness of breath.   Cardiovascular:  Negative for chest pain, palpitations and leg swelling.  Gastrointestinal: Negative.   Endocrine: Negative for heat intolerance, polydipsia, polyphagia and polyuria.  Neurological: Negative.   Psychiatric/Behavioral: Negative.     Per HPI unless specifically indicated above     Objective:    BP 108/62 (BP Location: Left Arm, Patient Position: Sitting, Cuff Size: Normal)    Pulse 89    Temp 98.4 F (36.9 C) (Oral)    Ht 4' 10.5" (1.486 m)    Wt 152 lb 3.2 oz (69  kg)    LMP  (LMP Unknown)    SpO2 94%    BMI 31.27 kg/m   Wt Readings from Last 3 Encounters:  07/31/21 152 lb 3.2 oz (69 kg)  07/18/21 156 lb (70.8 kg)  07/03/21 145 lb (65.8 kg)    Physical Exam Vitals and nursing note reviewed.  Constitutional:      General: She is awake. She is not in acute distress.    Appearance: She is well-developed and well-groomed. She is not ill-appearing.  HENT:     Head: Normocephalic.     Right Ear: Hearing normal.     Left Ear: Hearing normal.  Eyes:     General: Lids are normal.        Right eye: No discharge.        Left eye: No discharge.     Conjunctiva/sclera: Conjunctivae normal.     Pupils: Pupils are equal, round, and reactive to light.  Neck:     Thyroid: No thyromegaly.     Vascular: No carotid bruit.  Cardiovascular:     Rate and Rhythm: Normal rate and regular rhythm.     Heart sounds: Normal heart sounds. No murmur heard.   No gallop.  Pulmonary:     Effort: Pulmonary effort is normal. No accessory muscle usage or respiratory distress.     Breath sounds: Examination of the left-lower field reveals rhonchi. Wheezing and rhonchi present.     Comments:  Scattered  wheezes throughout with rhonchi to LLL. Abdominal:     General: Bowel sounds are normal.     Palpations: Abdomen is soft.  Musculoskeletal:     Cervical back: Normal range of motion and neck supple.     Right lower leg: No edema.     Left lower leg: No edema.  Lymphadenopathy:     Cervical: No cervical adenopathy.  Skin:    General: Skin is warm and dry.  Neurological:     Mental Status: She is alert and oriented to person, place, and time.  Psychiatric:        Attention and Perception: Attention normal.        Mood and Affect: Mood normal.        Speech: Speech normal.        Behavior: Behavior normal. Behavior is cooperative.        Thought Content: Thought content normal.    Results for orders placed or performed during the hospital encounter of 07/18/21  Resp Panel by RT-PCR (Flu A&B, Covid) Nasopharyngeal Swab   Specimen: Nasopharyngeal Swab; Nasopharyngeal(NP) swabs in vial transport medium  Result Value Ref Range   SARS Coronavirus 2 by RT PCR NEGATIVE NEGATIVE   Influenza A by PCR POSITIVE (A) NEGATIVE   Influenza B by PCR NEGATIVE NEGATIVE      Assessment & Plan:   Problem List Items Addressed This Visit       Cardiovascular and Mediastinum   Aortic atherosclerosis ([place])    Noted on CT 12/11/19.  Continue daily statin.  Monitor closely for symptoms.      Chronic HFrEF (heart failure with reduced ejection fraction) (HCC)    Chronic, ongoing with recent EF 45%, followed by cardiology.  Euvolemic on exam today.  Continue current medication regimen and collaboration with cardiology.  Recommend: - Reminded to call for an overnight weight gain of >2 pounds or a weekly weight gain of >5 pounds - not adding salt to food and read food labels. Reviewed the importance of  keeping daily sodium intake to <2064m daily - Avoid Ibuprofen products      Relevant Orders   Basic metabolic panel   Hypertension associated with diabetes ([place]    Chronic, stable with BP slightly on  lower side today, but home readings at goal.  Recommend she continue to monitor BP at home regularly.  Focus on DASH diet.  Continue current medication regimen and adjust as needed, refills sent as needed.  BMP today.  Return in 3 months.       Relevant Orders   Bayer DCA Hb A1c Waived   Basic metabolic panel   SVT (supraventricular tachycardia) (HCC)    Continue current medications and collaboration with cardiology.  Recent note reviewed.        Respiratory   COPD, moderate (HCC)    Chronic, ongoing.  Continue Trelegy which offers benefit of medication minimization and has benefited symptoms.  Continue to collaborate with Dr. FRaul Del recent notes reviewed.  No current daytime O2, at night only.      Relevant Medications   predniSONE (DELTASONE) 20 MG tablet   azithromycin (ZITHROMAX) 250 MG tablet   Other Relevant Orders   CBC with Differential/Platelet   DG Chest 2 View (Completed)   Pneumonia    Recent Flu 07/18/21 with ongoing cough.  Obtained imaging and noted to have a LLL PNA present on review images and report.  At this time discussed with patient, will start Augmentin and Zpack + Prednisone for cough and inflammation.  She is higher risk, aware that if worsening to immediately go to ER, but goal is to treat outpatient, which is acceptable at this time based on CURB-65 = 1.  Return in 2 weeks, sooner if worsening.      Relevant Medications   amoxicillin-clavulanate (AUGMENTIN) 875-125 MG tablet   azithromycin (ZITHROMAX) 250 MG tablet   Sleep apnea    Poor tolerance of CPAP mask, uses O2 night 2.5 L [place].        Endocrine   Hyperlipidemia associated with type 2 diabetes mellitus (HCC)    Chronic, stable.  Continue current medication regimen and adjust as needed.  Lipid panel up to date, refills sent in.      Relevant Orders   Bayer DCA Hb A1c Waived   Insulin dependent type 2 diabetes mellitus (HCC)    Chronic, ongoing, insulin dependent. A1C 8.0% today, downward  trend at this time.  Will continueTrulicity dose, 4.5 MG weekly, and continue Metformin at max dosing.  Increase Tresiba to 60 units and may need to add on meal insulin next visit.  Recommend she continue to monitor BS consistently at home and document + focus heavily on diet changes.  She is aware to notify provider if fasting BS >130 consistently, as may need to adjust insulin further.  In past poorly tolerated Jardiance -- discussed with her that and SGLT2 would benefit HF -- however concern for side effects.  Would benefit a [employer] may need to work on coverage for this. Return in 3 months.       Relevant Orders   Bayer DCA Hb A1c Waived   Type 2 diabetes, controlled, with neuropathy ([facility] [facility]    Chronic, ongoing, insulin dependent. Refer to insulin dependent diabetes plan.  Continue Gabapentin.      Relevant Orders   Bayer DCA Hb A1c Waived     Other   Presence of intrathecal pump (Chronic)    Continue [place] this.      Chronic pain syndrome    Continue to collaborate with chronic pain clinic in [facility].  Has morphine pump.        Relevant Medications   predniSONE (DELTASONE) 20 MG tablet   Depression, major, single episode, in partial remission (HCC)    Chronic, stable.  Denies SI/HI.  Mood well-controlled.  Continue current medication regimen and adjust as needed.  Refills as needed.  Consider trial of Duloxetine and transition off Effexor in future for dual pain and mood benefit.        Iron deficiency anemia    Ongoing and stable since infusions.  Will continue to collaborate with hematology, recent notes and labs reviewed.  Will check CBC today due to report sweating intermittent.      Night sweats    Check CBC today, is followed by hematology for iron deficiency anemia and history of elevation in WBC.  No other red flag symptoms or B symptoms present.  She will notify hematology as upcoming visit  too.      Obesity    BMI 31.27.  Recommended eating smaller high protein, low fat meals more frequently and exercising 30 mins a day 5 times a week with a goal of 10-15lb weight loss in the next 3 months. Patient voiced their understanding and motivation to adhere to these recommendations.         Follow up plan: Return in about 2 weeks (around 08/14/2021) for Flu and ongoing cough -- lung check.

## 2021-07-31 NOTE — Assessment & Plan Note (Signed)
Chronic, ongoing.  Continue Trelegy which offers benefit of medication minimization and has benefited symptoms.  Continue to collaborate with [name], recent notes reviewed.  No current daytime O2, at night only.

## 2021-07-31 NOTE — Assessment & Plan Note (Signed)
Continue to collaborate with pain clinic in [place] who monitors this. 

## 2021-07-31 NOTE — Patient Instructions (Signed)

## 2021-07-31 NOTE — Assessment & Plan Note (Signed)
Chronic, stable.  Continue current medication regimen and adjust as needed.  Lipid panel up to date, refills sent in.

## 2021-07-31 NOTE — Assessment & Plan Note (Signed)
Poor tolerance of CPAP mask, uses O2 night 2.5 L [place].

## 2021-07-31 NOTE — Assessment & Plan Note (Signed)
Chronic, stable with BP slightly on lower side today, but home readings at goal.  Recommend she continue to monitor BP at home regularly.  Focus on DASH diet.  Continue current medication regimen and adjust as needed, refills sent as needed.  BMP today.  Return in 3 months.

## 2021-07-31 NOTE — Assessment & Plan Note (Signed)
Check CBC today, is followed by hematology for iron deficiency anemia and history of elevation in WBC.  No other red flag symptoms or B symptoms present.  She will notify hematology as upcoming visit too.

## 2021-07-31 NOTE — Assessment & Plan Note (Signed)
Noted on CT 12/11/19.  Continue daily statin.  Monitor closely for symptoms.

## 2021-07-31 NOTE — Assessment & Plan Note (Signed)
Continue current medications and collaboration with cardiology.  Recent note reviewed.

## 2021-07-31 NOTE — Assessment & Plan Note (Signed)
Chronic, ongoing with recent EF 45%, followed by cardiology.  Euvolemic on exam today.  Continue current medication regimen and collaboration with cardiology.  Recommend: - Reminded to call for an overnight weight gain of >2 pounds or a weekly weight gain of >5 pounds - not adding salt to food and read food labels. Reviewed the importance of keeping daily sodium intake to 2000mg  daily - Avoid Ibuprofen products

## 2021-07-31 NOTE — Progress Notes (Signed)
Patient notified via telephone. Refer to 07/31/21 office visit for plan.

## 2021-08-01 LAB — CBC WITH DIFFERENTIAL/PLATELET
Basophils Absolute: 0.1 10*3/uL (ref 0.0–0.2)
Basos: 1 %
EOS (ABSOLUTE): 0.4 10*3/uL (ref 0.0–0.4)
Eos: 3 %
Hematocrit: 38 % (ref 34.0–46.6)
Hemoglobin: 12.5 g/dL (ref 11.1–15.9)
Immature Grans (Abs): 0.1 10*3/uL (ref 0.0–0.1)
Immature Granulocytes: 1 %
Lymphocytes Absolute: 3 10*3/uL (ref 0.7–3.1)
Lymphs: 22 %
MCH: 30.9 pg (ref 26.6–33.0)
MCHC: 32.9 g/dL (ref 31.5–35.7)
MCV: 94 fL (ref 79–97)
Monocytes Absolute: 0.9 10*3/uL (ref 0.1–0.9)
Monocytes: 7 %
Neutrophils Absolute: 9.2 10*3/uL — ABNORMAL HIGH (ref 1.4–7.0)
Neutrophils: 66 %
Platelets: 421 10*3/uL (ref 150–450)
RBC: 4.04 x10E6/uL (ref 3.77–5.28)
RDW: 12.5 % (ref 11.7–15.4)
WBC: 13.8 10*3/uL — ABNORMAL HIGH (ref 3.4–10.8)

## 2021-08-01 LAB — BASIC METABOLIC PANEL
BUN/Creatinine Ratio: 19 (ref 12–28)
BUN: 18 mg/dL (ref 8–27)
CO2: 30 mmol/L — ABNORMAL HIGH (ref 20–29)
Calcium: 9.1 mg/dL (ref 8.7–10.3)
Chloride: 94 mmol/L — ABNORMAL LOW (ref 96–106)
Creatinine, Ser: 0.97 mg/dL (ref 0.57–1.00)
Glucose: 121 mg/dL — ABNORMAL HIGH (ref 70–99)
Potassium: 5.2 mmol/L (ref 3.5–5.2)
Sodium: 141 mmol/L (ref 134–144)
eGFR: 62 mL/min/{1.73_m2} (ref >59)

## 2021-08-01 NOTE — Progress Notes (Signed)
Good morning, please let Jordan Chan know her labs have returned.  Kidney function and liver function remain stable.  CBC shows elevation in white blood cell and neutrophils again, which could be related to pneumonia as well at this time.  I recommend we recheck once improved, although I know hematology will recheck as well -- ensure you mention the sweats to them too.  Any questions? Keep being amazing!!  Thank you for allowing me to participate in your care.  I appreciate you. Kindest regards, Chizara Mena

## 2021-08-05 DIAGNOSIS — J45909 Unspecified asthma, uncomplicated: Secondary | ICD-10-CM | POA: Diagnosis not present

## 2021-08-05 DIAGNOSIS — J449 Chronic obstructive pulmonary disease, unspecified: Secondary | ICD-10-CM | POA: Diagnosis not present

## 2021-08-07 ENCOUNTER — Other Ambulatory Visit: Payer: Self-pay | Admitting: Nurse Practitioner

## 2021-08-08 NOTE — Telephone Encounter (Signed)
Refilled 05/11/2021 #300 with 3 refills. Pt should have enough to last until 04/2022. Requested Prescriptions  Pending Prescriptions Disp Refills   TRUE METRIX BLOOD GLUCOSE TEST test strip [Pharmacy Med Name: TRUE METRIX SELF MONITORING BLOOD GLUCOSE STRIPS   Strip] 300 strip 3    Sig: TEST BLOOD SUGAR THREE TIMES DAILY     Endocrinology: Diabetes - Testing Supplies Passed - 08/07/2021 10:37 AM      Passed - Valid encounter within last 12 months    Recent Outpatient Visits          1 week ago Type 2 diabetes, controlled, with neuropathy ([place])   [place], [place] T, NP   1 month ago Urinary symptom or sign   [employer], [place] T, NP   3 months ago Type 2 diabetes, controlled, with neuropathy ([place])   Snyder, Jolene T, NP   6 months ago Type 2 diabetes, controlled, with neuropathy ([place])   [place], Jolene T, NP   9 months ago Insulin dependent type 2 diabetes mellitus ([place])   Ramos, [name], NP      Future Appointments            In 6 days [name], [name], NP [employer], [place]   In 2 months End, Harrell Gave, MD [place], [place]   In 4 months  [employer] [facility], [place]

## 2021-08-13 ENCOUNTER — Encounter: Payer: Self-pay | Admitting: Nurse Practitioner

## 2021-08-13 ENCOUNTER — Inpatient Hospital Stay (HOSPITAL_BASED_OUTPATIENT_CLINIC_OR_DEPARTMENT_OTHER): Payer: Medicare HMO | Admitting: Nurse Practitioner

## 2021-08-13 ENCOUNTER — Other Ambulatory Visit: Payer: Self-pay

## 2021-08-13 ENCOUNTER — Inpatient Hospital Stay: Payer: Medicare HMO | Attending: Nurse Practitioner

## 2021-08-13 VITALS — BP 114/55 | HR 83 | Temp 97.7°F | Resp 20 | Wt 153.9 lb

## 2021-08-13 DIAGNOSIS — E1140 Type 2 diabetes mellitus with diabetic neuropathy, unspecified: Secondary | ICD-10-CM | POA: Diagnosis not present

## 2021-08-13 DIAGNOSIS — Z801 Family history of malignant neoplasm of trachea, bronchus and lung: Secondary | ICD-10-CM | POA: Insufficient documentation

## 2021-08-13 DIAGNOSIS — J449 Chronic obstructive pulmonary disease, unspecified: Secondary | ICD-10-CM | POA: Insufficient documentation

## 2021-08-13 DIAGNOSIS — Z808 Family history of malignant neoplasm of other organs or systems: Secondary | ICD-10-CM | POA: Diagnosis not present

## 2021-08-13 DIAGNOSIS — K219 Gastro-esophageal reflux disease without esophagitis: Secondary | ICD-10-CM | POA: Insufficient documentation

## 2021-08-13 DIAGNOSIS — Z800 Family history of malignant neoplasm of digestive organs: Secondary | ICD-10-CM | POA: Diagnosis not present

## 2021-08-13 DIAGNOSIS — R232 Flushing: Secondary | ICD-10-CM | POA: Insufficient documentation

## 2021-08-13 DIAGNOSIS — D75839 Thrombocytosis, unspecified: Secondary | ICD-10-CM | POA: Insufficient documentation

## 2021-08-13 DIAGNOSIS — Z87891 Personal history of nicotine dependence: Secondary | ICD-10-CM | POA: Insufficient documentation

## 2021-08-13 DIAGNOSIS — D72829 Elevated white blood cell count, unspecified: Secondary | ICD-10-CM | POA: Insufficient documentation

## 2021-08-13 DIAGNOSIS — D72821 Monocytosis (symptomatic): Secondary | ICD-10-CM

## 2021-08-13 DIAGNOSIS — Z90710 Acquired absence of both cervix and uterus: Secondary | ICD-10-CM | POA: Insufficient documentation

## 2021-08-13 DIAGNOSIS — I110 Hypertensive heart disease with heart failure: Secondary | ICD-10-CM | POA: Diagnosis not present

## 2021-08-13 DIAGNOSIS — I509 Heart failure, unspecified: Secondary | ICD-10-CM | POA: Insufficient documentation

## 2021-08-13 LAB — CBC WITH DIFFERENTIAL/PLATELET
Abs Immature Granulocytes: 0.04 10*3/uL (ref 0.00–0.07)
Basophils Absolute: 0.1 10*3/uL (ref 0.0–0.1)
Basophils Relative: 1 %
Eosinophils Absolute: 0.4 10*3/uL (ref 0.0–0.5)
Eosinophils Relative: 5 %
HCT: 38.5 % (ref 36.0–46.0)
Hemoglobin: 12.4 g/dL (ref 12.0–15.0)
Immature Granulocytes: 0 %
Lymphocytes Relative: 31 %
Lymphs Abs: 3 10*3/uL (ref 0.7–4.0)
MCH: 31.2 pg (ref 26.0–34.0)
MCHC: 32.2 g/dL (ref 30.0–36.0)
MCV: 96.7 fL (ref 80.0–100.0)
Monocytes Absolute: 0.9 10*3/uL (ref 0.1–1.0)
Monocytes Relative: 9 %
Neutro Abs: 5.2 10*3/uL (ref 1.7–7.7)
Neutrophils Relative %: 54 %
Platelets: 387 10*3/uL (ref 150–400)
RBC: 3.98 MIL/uL (ref 3.87–5.11)
RDW: 13 % (ref 11.5–15.5)
WBC: 9.7 10*3/uL (ref 4.0–10.5)
nRBC: 0 % (ref 0.0–0.2)

## 2021-08-13 NOTE — Progress Notes (Signed)
Patient here for follow up she reports that she has been experiencing a new onset of hot flashes almost daily. She has also been recently treated for pneumonia.

## 2021-08-13 NOTE — Progress Notes (Signed)
Hematology/Oncology Consult Note [facility]  [phone] [phone]  Patient Care Team: [name], NP as PCP - General (Nurse Practitioner) End, Harrell Gave, MD as PCP - Cardiology (Cardiology) Erby Pian, MD as Referring Physician (Pulmonary Disease) Kandace Blitz, MD as Referring Physician (Pain Medicine) Vladimir Faster, [facility] (Inactive) as Pharmacist (Pharmacist) Rebekah Chesterfield, LCSW as Social Worker (Licensed Clinical Social Worker)   Name of the patient: Jordan Chan  [id number]  [birth year]   Date of visit: 08/13/21  Diagnosis-leukocytosis mainly mild monocytosis and thrombocytosis of unclear etiology  Chief complaint/ Reason for visit-routine follow-up of leukocytosis/thrombocytosis  Heme/Onc history: patient is a 71 year old female with a past medical history significant for COPD GERD insulin-dependent diabetes history of breast cancer in the remote past who has been referred to [place] for leukocytosis and thrombocytosis.Looking back at her CBC her white cell count fluctuates between 10-16 over the last 7 years.  More recently since the last 3 months her white count has been between 11-13.  There are times when her white count has gone up to 34 and patient thinks that she may have been on steroids.  Differential has mainly showed neutrophilia but also with some monocytosis and mild eosinophilia.  Patient also noted to have a platelet count mainly in the high 400s at least since 2015 with no clear rising trend.  Hemoglobin more recently has been close to 11  Results of blood work from 12/21/2020 were as follows: CBC showed white cell count of 13, H&H of 11.7/37.2 with an MCV of 89 and platelet count of 446.  Iron studies did show evidence of iron deficiency with elevated TIBC of 475, low iron saturation of 8%And low ferritin of 5 patient received 5 doses of Venofer  BCR ABL FISH testing, JAK2 mutation analysis, flow  cytometry wasUnremarkable.  Interval history-patient is 71 year old female who returns to clinic from a repeat laboratory evaluation and follow-up for leukocytosis and thrombocytosis.  She complains of hot flashes which are drenching and intermittent.  Do not appear to be worsening in frequency.  No unintentional weight loss, pain, changes in bowel movements.  No other specific complaints today.  ECOG PS- 1 Pain scale- 0  Review of systems- Review of Systems  Constitutional:  Negative for chills, fever, malaise/fatigue and weight loss.       Hot flashes  HENT:  Negative for hearing loss, nosebleeds, sore throat and tinnitus.   Eyes:  Negative for blurred vision and double vision.  Respiratory:  Negative for cough, hemoptysis, shortness of breath and wheezing.   Cardiovascular:  Negative for chest pain, palpitations and leg swelling.  Gastrointestinal:  Negative for abdominal pain, blood in stool, constipation, diarrhea, melena, nausea and vomiting.  Genitourinary:  Negative for dysuria and urgency.  Musculoskeletal:  Negative for back pain, falls, joint pain and myalgias.  Skin:  Negative for itching and rash.  Neurological:  Negative for dizziness, tingling, sensory change, loss of consciousness, weakness and headaches.  Endo/Heme/Allergies:  Negative for environmental allergies. Does not bruise/bleed easily.  Psychiatric/Behavioral:  Negative for depression. The patient is not nervous/anxious and does not have insomnia.      Allergies  Allergen Reactions   Other Palpitations    IV steroids   Pain Patch [Menthol] Anaphylaxis   Fentanyl    Avelox [Moxifloxacin Hcl In Nacl] Other (See Comments)    Upset stomach   Doxycycline Diarrhea   Erythromycin Nausea Only and Other (See Comments)    Can take  a Z-Pak just fine Other reaction(s): Other (See Comments) Can take Z-Pak  Can take a Z-Pak just fine   Moxifloxacin Hcl Other (See Comments)    Upset stomach Upset stomach   Oxycontin  [Oxycodone] Hives   Ozempic [Semaglutide] Nausea Only     Past Medical History:  Diagnosis Date   Arthritis    Asthma    Breast cancer ([place]) 1998   right breast ca with mastectomy and chemotherapy and radiation   Bronchitis    CHF (congestive heart failure) ([place])    "with Morphine withdrawal"   COPD (chronic obstructive pulmonary disease) ([place])    Diabetes mellitus without complication ([place])    Diverticulitis    diverticulosis also   Dyspnea    Endometriosis    GERD (gastroesophageal reflux disease)    History of shingles 2000-2005   Hypercholesteremia    Hypertension    IBS (irritable bowel syndrome)    Low back pain    a. Implanted morphine/bupivicaine/clonidine pump.   Neuropathy    Orthopnea    Oxygen dependent    uses at night   Personal history of chemotherapy    Personal history of radiation therapy    Pneumonia    pneumonia 5-6 times, history of bronchitis also   Scoliosis    Sleep apnea    does not use cpap   Stroke ([place]) 2010   TIA, 10 years ago   Withdrawal from sedative drug ([place])    withdrawal from morphine when pump batteries died     Past Surgical History:  Procedure Laterality Date   ABDOMINAL HYSTERECTOMY  1987   BACK SURGERY     Tailbone removed following fracture   BREAST SURGERY Right    mastectomy   CARDIAC CATHETERIZATION     CATARACT EXTRACTION W/PHACO Right 05/19/2019   Procedure: CATARACT EXTRACTION PHACO AND INTRAOCULAR LENS PLACEMENT ([place]), RIGHT, DIABETIC;  Surgeon: Marchia Meiers, MD;  Location: [facility] ORS;  Service: Ophthalmology;  Laterality: Right;  Lot # [id number] H [place]: 00:43.8 CDE: 4.59   CATARACT EXTRACTION W/PHACO Left 06/16/2019   Procedure: CATARACT EXTRACTION PHACO AND INTRAOCULAR LENS PLACEMENT ([place]) LEFT VISION BLUE DIABETIC;  Surgeon: Marchia Meiers, MD;  Location: [facility] ORS;  Service: Ophthalmology;  Laterality: Left;  Lot #[id number] H [place]: 00:46.9 CDE: 6.53   COCCYX REMOVAL     COLONOSCOPY WITH PROPOFOL N/A 01/01/2017    Procedure: COLONOSCOPY WITH PROPOFOL;  Surgeon: Jonathon Bellows, MD;  Location: [facility] ENDOSCOPY;  Service: Endoscopy;  Laterality: N/A;   ELBOW ARTHROSCOPY WITH TENDON RECONSTRUCTION     ESOPHAGOGASTRODUODENOSCOPY (EGD) WITH PROPOFOL N/A 01/01/2017   Procedure: ESOPHAGOGASTRODUODENOSCOPY (EGD) WITH PROPOFOL;  Surgeon: Jonathon Bellows, MD;  Location: [facility] ENDOSCOPY;  Service: Endoscopy;  Laterality: N/A;   INTRATHECAL PUMP IMPLANT     LEFT HEART CATH AND CORONARY ANGIOGRAPHY N/A 04/28/2017   Procedure: LEFT HEART CATH AND CORONARY ANGIOGRAPHY;  Surgeon: [name], MD;  Location: [facility] LAB;  Service: Cardiovascular;  Laterality: N/A;   MASTECTOMY Right 06/1997   morphine pump  2011   PLANTAR FASCIA RELEASE     TRIGGER FINGER RELEASE      Social History   Socioeconomic History   Marital status: Unknown    Spouse name: Not on file   Number of children: Not on file   Years of education: Not on file   Highest education level: High school graduate  Occupational History   Occupation: retired  Tobacco Use   Smoking status: Former    Packs/day: 1.00  Years: 30.00    Pack years: 30.00    Types: Cigarettes    Quit date: 04/30/2004    Years since quitting: 17.2   Smokeless tobacco: Never  Vaping Use   Vaping Use: Never used  Substance and Sexual Activity   Alcohol use: No   Drug use: No   Sexual activity: Not Currently    Birth control/protection: Post-menopausal  Other Topics Concern   Not on file  Social History Narrative   ** Merged History Encounter **       Social Determinants of Health   Financial Resource Strain: Low Risk    Difficulty of Paying Living Expenses: Not hard at all  Food Insecurity: No Food Insecurity   Worried About Charity fundraiser in the Last Year: Never true   Ran Out of Food in the Last Year: Never true  Transportation Needs: No Transportation Needs   Lack of Transportation (Medical): No   Lack of Transportation (Non-Medical): No  Physical  Activity: Inactive   Days of Exercise per Week: 0 days   Minutes of Exercise per Session: 0 min  Stress: No Stress Concern Present   Feeling of Stress : Not at all  Social Connections: Not on file  Intimate Partner Violence: Not on file    Family History  Problem Relation Age of Onset   Cancer Mother 19       lung   Thyroid disease Mother    Cancer Father 29       Pancreatic   Hypertension Sister    Cancer Sister        "cancer on face"   Hyperlipidemia Sister    Osteoporosis Maternal Grandmother    Hyperlipidemia Son    Seizures Son    Cancer Paternal Grandmother        colon   Arthritis Paternal Grandfather    Breast cancer Neg Hx      Current Outpatient Medications:    ACCU-CHEK AVIVA PLUS test strip, TEST THREE TIMES DAILY, Disp: 300 strip, Rfl: 3   Accu-Chek Softclix Lancets lancets, TEST BLOOD SUGAR THREE TIMES DAILY, Disp: 300 each, Rfl: 1   albuterol (PROVENTIL HFA;VENTOLIN HFA) 108 (90 BASE) MCG/ACT inhaler, Inhale 2 puffs into the lungs every 6 (six) hours as needed for wheezing or shortness of breath. , Disp: , Rfl:    Alcohol Swabs (B-D SINGLE USE SWABS REGULAR) PADS, USE TWICE DAILY  WITH  SUGAR  CHECKS, Disp: 200 each, Rfl: 2   atorvastatin (LIPITOR) 20 MG tablet, Take 1 tablet (20 mg total) by mouth daily., Disp: 90 tablet, Rfl: 4   Blood Glucose Monitoring Suppl (TRUE METRIX METER) w/Device KIT, Use to check blood sugar 4 times a day, Disp: 1 kit, Rfl: 4   cholecalciferol (VITAMIN D3) 25 MCG (1000 UNIT) tablet, Take 1,000 Units by mouth daily., Disp: , Rfl:    Cyanocobalamin 1000 MCG/ML KIT, Inject 1,000 mcg as directed every 30 (thirty) days., Disp: , Rfl:    cyclobenzaprine (FLEXERIL) 5 MG tablet, Take 5 mg by mouth 2 (two) times daily as needed for muscle spasms. Takes very rarely, Disp: , Rfl:    diclofenac (VOLTAREN) 50 MG EC tablet, Take 1 tablet (50 mg total) by mouth 3 (three) times daily., Disp: 270 tablet, Rfl: 0   Dulaglutide (TRULICITY) 4.5  GH/8.2XH SOPN, Inject 4.5 mg as directed once a week., Disp: 6 mL, Rfl: 2   folic acid (FOLVITE) 1 MG tablet, TAKE 1 TABLET EVERY DAY, Disp: 60 tablet,  Rfl: 3   furosemide (LASIX) 40 MG tablet, TAKE 1 TABLET EVERY DAY, Disp: 90 tablet, Rfl: 0   gabapentin (NEURONTIN) 800 MG tablet, Take 1 tablet (800 mg total) by mouth 3 (three) times daily., Disp: 270 tablet, Rfl: 4   lactulose (CHRONULAC) 10 GM/15ML solution, Take 45 mLs (30 g total) by mouth daily as needed for mild constipation or severe constipation., Disp: 1892 mL, Rfl: 0   metFORMIN (GLUCOPHAGE) 1000 MG tablet, Take 1 tablet (1,000 mg total) by mouth 2 (two) times daily with a meal., Disp: 180 tablet, Rfl: 4   metoprolol succinate (TOPROL-XL) 25 MG 24 hr tablet, TAKE 1/2 TABLET EVERY DAY, Disp: 45 tablet, Rfl: 0   montelukast (SINGULAIR) 10 MG tablet, Take 10 mg by mouth at bedtime., Disp: , Rfl:    PAIN MANAGEMENT INTRATHECAL, IT, PUMP, 1 each by Intrathecal route. Intrathecal (IT) medication:  Morphine Patient does not remember current. Adjusted every 2.5 months at [facility] in [place]., Disp: , Rfl:    TRELEGY ELLIPTA 100-62.5-25 MCG/INH AEPB, INHALE 1 PUFF INTO THE LUNGS DAILY., Disp: 180 each, Rfl: 4   TRESIBA FLEXTOUCH 100 UNIT/ML FlexTouch Pen, INJECT 30 UNITS INTO THE SKIN AT BEDTIME, Disp: 30 mL, Rfl: 3   venlafaxine XR (EFFEXOR-XR) 150 MG 24 hr capsule, Take 1 capsule (150 mg total) by mouth daily., Disp: 90 capsule, Rfl: 4  Physical exam:  Vitals:   08/13/21 0934  BP: (!) 114/55  Pulse: 83  Resp: 20  Temp: 97.7 F (36.5 C)  TempSrc: Tympanic  Weight: 153 lb 14.4 oz (69.8 kg)   Physical Exam Constitutional:      General: She is not in acute distress.    Appearance: She is not ill-appearing.  Cardiovascular:     Rate and Rhythm: Normal rate and regular rhythm.  Pulmonary:     Effort: Pulmonary effort is normal. No respiratory distress.  Skin:    General: Skin is warm and dry.  Neurological:      Mental Status: She is alert and oriented to person, place, and time.  Psychiatric:        Mood and Affect: Mood normal.        Behavior: Behavior normal.     CMP Latest Ref Rng & Units 07/31/2021  Glucose 70 - 99 mg/dL 121(H)  BUN 8 - 27 mg/dL 18  Creatinine 0.57 - 1.00 mg/dL 0.97  Sodium 134 - 144 mmol/L 141  Potassium 3.5 - 5.2 mmol/L 5.2  Chloride 96 - 106 mmol/L 94(L)  CO2 20 - 29 mmol/L 30(H)  Calcium 8.7 - 10.3 mg/dL 9.1  Total Protein 6.0 - 8.5 g/dL -  Total Bilirubin 0.0 - 1.2 mg/dL -  Alkaline Phos 44 - 121 IU/L -  AST 0 - 40 IU/L -  ALT 0 - 32 IU/L -   CBC Latest Ref Rng & Units 08/13/2021  WBC 4.0 - 10.5 K/uL 9.7  Hemoglobin 12.0 - 15.0 g/dL 12.4  Hematocrit 36.0 - 46.0 % 38.5  Platelets 150 - 400 K/uL 387   No images are attached to the encounter.  DG Chest 2 View  Result Date: 07/31/2021 CLINICAL DATA:  71 year old female with a history of cough EXAM: CHEST - 2 VIEW COMPARISON:  03/31/2020, 07/18/2021 FINDINGS: Cardiomediastinal silhouette unchanged in size and contour. Right rotation somewhat limits the exam though there is increased reticulonodular opacities at the left lung base compared to remote plain film. No pneumothorax or pleural effusion. No interlobular septal thickening. Degenerative  changes of the spine with no acute displaced fracture. IMPRESSION: Suspected mixed interstitial and airspace disease at the left lung base most suggestive of pneumonia given the history. Followup PA and lateral chest X-ray is recommended in 3-4 weeks following therapy to assure resolution. Electronically Signed   By: Corrie Mckusick D.O.   On: 07/31/2021 10:23   DG Chest 2 View  Result Date: 07/18/2021 CLINICAL DATA:  Cough and congestion. EXAM: CHEST - 2 VIEW COMPARISON:  PA Lat 01/11/2021. FINDINGS: The cardiac size is normal. There are calcifications of the transverse aorta. The lungs show mild chronic changes. There is asymmetric right apical pleuroparenchymal scarring and  calcified granulomas and small scattered subcentimeter nodules unchanged in gross appearance in the right upper lobe. No mass or infiltrate is seen. There is no pleural effusion. Osteopenia and thoracic spondylosis. IMPRESSION: No active cardiopulmonary disease. Grossly stable scattered subcentimeter right upper lobe nodules. Chronic change. Electronically Signed   By: Telford Nab M.D.   On: 07/18/2021 20:46     Assessment and plan- Patient is a 71 y.o. female referred for leukocytosis and thrombocytosis.    Leukocytosis- etiology unclear. Differential has shown intermittent monocytosis as well as neutropenia which waxes and wanes since 2018. JAK2, flow cytometry, BCR ABL FISH, CALR, MPL were all negative. Today, counts have normalized. Will hold off on bone marrow at this time. Monitor. Thrombocytosis- etiology unclear. Counts have been stable in the 400s without clear rising trend. Previously she did have some iron deficiency and received 5 doses of venofer which resolved her anemia but thrombocytosis persisted. Monitor.  Hot flashes- new over past couple of months. Unclear etiology. Monitor.  Rtc in 6 months for labs (cbc) & follow up with Dr. Janese Banks   Visit Diagnosis 1. Leukocytosis, unspecified type   2. Thrombocytosis   3. Hot flashes    Beckey Rutter, DNP, AGNP-C [place] at [facility] [phone] (clinic) 08/13/2021  CC: [name], NP

## 2021-08-14 ENCOUNTER — Other Ambulatory Visit: Payer: Self-pay

## 2021-08-14 ENCOUNTER — Encounter: Payer: Self-pay | Admitting: Nurse Practitioner

## 2021-08-14 ENCOUNTER — Ambulatory Visit (INDEPENDENT_AMBULATORY_CARE_PROVIDER_SITE_OTHER): Payer: Medicare HMO | Admitting: Nurse Practitioner

## 2021-08-14 VITALS — BP 98/62 | HR 86 | Temp 98.2°F | Ht 58.5 in | Wt 155.2 lb

## 2021-08-14 DIAGNOSIS — J449 Chronic obstructive pulmonary disease, unspecified: Secondary | ICD-10-CM | POA: Diagnosis not present

## 2021-08-14 DIAGNOSIS — E1140 Type 2 diabetes mellitus with diabetic neuropathy, unspecified: Secondary | ICD-10-CM | POA: Diagnosis not present

## 2021-08-14 DIAGNOSIS — J1000 Influenza due to other identified influenza virus with unspecified type of pneumonia: Secondary | ICD-10-CM | POA: Diagnosis not present

## 2021-08-14 DIAGNOSIS — G894 Chronic pain syndrome: Secondary | ICD-10-CM

## 2021-08-14 MED ORDER — ROPINIROLE HCL 0.25 MG PO TABS: 0.2500 mg | ORAL_TABLET | Freq: Every day | ORAL | 4 refills | Status: DC

## 2021-08-14 NOTE — Patient Instructions (Addendum)
NEXT Wednesday GO AND OBTAIN REPEAT CHEST X-RAY -- [place].  Community-Acquired Pneumonia, Adult Pneumonia is an infection of the lungs. It causes irritation and swelling in the airways of the lungs. Mucus and fluid may also build up inside the airways. This may cause coughing and trouble breathing. One type of pneumonia can happen while you are in a hospital. A different type can happen when you are not in a hospital (community-acquired pneumonia). What are the causes? This condition is caused by germs (viruses, bacteria, or fungi). Some types of germs can spread from person to person. Pneumonia is not thought to spread from person to person. What increases the risk? You are more likely to develop this condition if: You have a long-term (chronic) disease, such as: Disease of the lungs. This may be chronic obstructive pulmonary disease (COPD) or asthma. Heart failure. Cystic fibrosis. Diabetes. Kidney disease. Sickle cell disease. HIV. You have other health problems, such as: Your body's defense system (immune system) is weak. A condition that may cause you to breathe in fluids from your mouth and nose. You had your spleen taken out. You do not take good care of your teeth and mouth (poor dental hygiene). You use or have used tobacco products. You travel where the germs that cause this illness are common. You are near certain animals or the places they live. You are older than 72 years of age. What are the signs or symptoms? Symptoms of this condition include: A cough. A fever. Sweating or chills. Chest pain, often when you breathe deeply or cough. Breathing problems, such as: Fast breathing. Trouble breathing. Shortness of breath. Feeling tired (fatigued). Muscle aches. How is this treated? Treatment for this condition depends on many things, such as: The cause of your illness. Your medicines. Your other health problems. Most adults can be treated at home.  Sometimes, treatment must happen in a hospital. Treatment may include medicines to kill germs. Medicines may depend on which germ caused your illness. Very bad pneumonia is rare. If you get it, you may: Have a machine to help you breathe. Have fluid taken away from around your lungs. Follow these instructions at home: Medicines Take over-the-counter and prescription medicines only as told by your doctor. Take cough medicine only if you are losing sleep. Cough medicine can keep your body from taking mucus away from your lungs. If you were prescribed an antibiotic medicine, take it as told by your doctor. Do not stop taking the antibiotic even if you start to feel better. Lifestyle   Do not drink alcohol. Do not use any products that contain nicotine or tobacco, such as cigarettes, e-cigarettes, and chewing tobacco. If you need help quitting, ask your doctor. Eat a healthy diet. This includes a lot of vegetables, fruits, whole grains, low-fat dairy products, and low-fat (lean) protein. General instructions  Rest a lot. Sleep for at least 8 hours each night. Sleep with your head and neck raised. Put a few pillows under your head or sleep in a reclining chair. Return to your normal activities as told by your doctor. Ask your doctor what activities are safe for you. Drink enough fluid to keep your pee (urine) pale yellow. If your throat is sore, rinse your mouth often with salt water. To make salt water, dissolve -1 tsp (3-6 g) of salt in 1 cup (237 mL) of warm water. Keep all follow-up visits as told by your doctor. This is important. How is this prevented? You can lower your risk  of pneumonia by: Getting the pneumonia shot (vaccine). These shots have different types and schedules. Ask your doctor what works best for you. Think about getting this shot if: You are older than 72 years of age. You are 35-35 years of age and: You are being treated for cancer. You have long-term lung  disease. You have other problems that affect your body's defense system. Ask your doctor if you have one of these. Getting your flu shot every year. Ask your doctor which type of shot is best for you. Going to the dentist as often as told. Washing your hands often with soap and water for at least 20 seconds. If you cannot use soap and water, use hand sanitizer. Contact a doctor if: You have a fever. You lose sleep because your cough medicine does not help. Get help right away if: You are short of breath and this gets worse. You have more chest pain. Your sickness gets worse. This is very serious if: You are an older adult. Your body's defense system is weak. You cough up blood. These symptoms may be an emergency. Do not wait to see if the symptoms will go away. Get medical help right away. Call your local emergency services (911 in the [place] Do not drive yourself to the hospital. Summary Pneumonia is an infection of the lungs. Community-acquired pneumonia affects people who have not been in the hospital. Certain germs can cause this infection. This condition may be treated with medicines that kill germs. For very bad pneumonia, you may need a hospital stay and treatment to help with breathing. This information is not intended to replace advice given to you by your health care provider. Make sure you discuss any questions you have with your health care provider. Document Revised: 05/03/2019 Document Reviewed: 05/03/2019 Elsevier Patient Education  [place].

## 2021-08-14 NOTE — Assessment & Plan Note (Addendum)
Chronic, ongoing, insulin dependent. Refer to insulin dependent diabetes plan.  Continue Gabapentin, however have discussed with patient due to her current CrCl (44.8) recommended dosing for Gabapentin is 900 MG total daily, she refuses to reduce this due to her chronic pain issues.  At this time will trial a dose of Requip 0.25 MG QHS for RLS-like symptoms + recommend she take Magnesium 400 MG at night == levels may be lower with her Lasix use.  Return in 4 weeks.

## 2021-08-14 NOTE — Progress Notes (Signed)
BP 98/62    Pulse 86    Temp 98.2 F (36.8 C) (Oral)    Ht 4' 10.5" (1.486 m)    Wt 155 lb 3.2 oz (70.4 kg)    LMP  (LMP Unknown)    SpO2 96%    BMI 31.88 kg/m    Subjective:    [name], female    DOB: 09/07/1950, 71 y.o.   MRN: [id number]  HPI: Jordan Chan is a 71 y.o. female  Chief Complaint  Patient presents with   Pneumonia    Patient states she is here for follow up Pneumonia. Patient states she states still feels rough and weak. Patient states other than that, she does not feel too bad.    Leg Pain    Patient states both of her legs are causing pain. Patient states they feel as if her legs have been jammed up and sometimes they feel numbness. Patient states she first noticed it about a week ago. Patient denies trying anything to help the pain and discomfort. Patient states the pain has been steady for the past week.    COPD Recent noted on imaging on 07/31/21 -- was treated with two abx and has completed courses.  Overall feeling better, with exception of some ongoing fatigue.  Pneumonia present recently after flu.    Followed by pulmonary for COPD -- last saw 12/722.  Currently using Trelegy and Singulair.    Has a history of smoking, quit 15 years ago. She does not use CPAP, but continues to use O2 at night.  Currently off Eliquis, stopped in November 2021 -- history of PE.   COPD status: controlled Satisfied with current treatment?: yes Oxygen use: no Dyspnea frequency: at baseline, no worsening Cough frequency: improved Rescue inhaler frequency:  minimal Limitation of activity: no Productive cough: none Last Spirometry: with pulmonary Pneumovax: Up to Date Influenza: Up to Date   CHRONIC PAIN: Followed by [facility] in [place], last seen 07/17/21 -- continues pump + Gabapentin.  Followed by clinic nurse for pump check and replacement. Reports pain is well-controlled with current regimen -- at baseline for her, however is having  worsening leg pain & numbness over past week (varies day to day) -- reports they are very restless and move a lot.  Reports at one time she took four 800 MG Gabapentin a day. For last week notices it most at night.    Estimated Creatinine Clearance: 44.8 mL/min (by C-G formula based on SCr of 0.97 mg/dL).  Relevant past medical, surgical, family and social history reviewed and updated as indicated. Interim medical history since our last visit reviewed. Allergies and medications reviewed and updated.  Review of Systems  Constitutional:  Negative for activity change, appetite change, diaphoresis, fatigue and fever.  Respiratory:  Positive for shortness of breath (at baseline). Negative for cough (improved), chest tightness and wheezing.   Cardiovascular:  Negative for chest pain, palpitations and leg swelling.  Gastrointestinal: Negative.   Endocrine: Negative for heat intolerance, polydipsia, polyphagia and polyuria.  Musculoskeletal:  Positive for arthralgias.  Neurological: Negative.   Psychiatric/Behavioral: Negative.     Per HPI unless specifically indicated above     Objective:    BP 98/62    Pulse 86    Temp 98.2 F (36.8 C) (Oral)    Ht 4' 10.5" (1.486 m)    Wt 155 lb 3.2 oz (70.4 kg)    LMP  (LMP Unknown)    SpO2  96%    BMI 31.88 kg/m   Wt Readings from Last 3 Encounters:  08/14/21 155 lb 3.2 oz (70.4 kg)  08/13/21 153 lb 14.4 oz (69.8 kg)  07/31/21 152 lb 3.2 oz (69 kg)    Physical Exam Vitals and nursing note reviewed.  Constitutional:      General: She is awake. She is not in acute distress.    Appearance: She is well-developed and well-groomed. She is not ill-appearing.  HENT:     Head: Normocephalic.     Right Ear: Hearing normal.     Left Ear: Hearing normal.  Eyes:     General: Lids are normal.        Right eye: No discharge.        Left eye: No discharge.     Conjunctiva/sclera: Conjunctivae normal.     Pupils: Pupils are equal, round, and reactive to light.   Neck:     Thyroid: No thyromegaly.     Vascular: No carotid bruit.  Cardiovascular:     Rate and Rhythm: Normal rate and regular rhythm.     Heart sounds: Normal heart sounds. No murmur heard.   No gallop.  Pulmonary:     Effort: Pulmonary effort is normal. No accessory muscle usage or respiratory distress.     Breath sounds: Normal breath sounds. No wheezing or rhonchi.     Comments: Overall lung sounds improved on exam today. Abdominal:     General: Bowel sounds are normal.     Palpations: Abdomen is soft.  Musculoskeletal:     Cervical back: Normal range of motion and neck supple.     Right lower leg: No edema.     Left lower leg: No edema.  Lymphadenopathy:     Cervical: No cervical adenopathy.  Skin:    General: Skin is warm and dry.  Neurological:     Mental Status: She is alert and oriented to person, place, and time.  Psychiatric:        Attention and Perception: Attention normal.        Mood and Affect: Mood normal.        Speech: Speech normal.        Behavior: Behavior normal. Behavior is cooperative.        Thought Content: Thought content normal.    Results for orders placed or performed in visit on 08/13/21  CBC with Differential  Result Value Ref Range   WBC 9.7 4.0 - 10.5 K/uL   RBC 3.98 3.87 - 5.11 MIL/uL   Hemoglobin 12.4 12.0 - 15.0 g/dL   HCT 38.5 36.0 - 46.0 %   MCV 96.7 80.0 - 100.0 fL   MCH 31.2 26.0 - 34.0 pg   MCHC 32.2 30.0 - 36.0 g/dL   RDW 13.0 11.5 - 15.5 %   Platelets 387 150 - 400 K/uL   nRBC 0.0 0.0 - 0.2 %   Neutrophils Relative % 54 %   Neutro Abs 5.2 1.7 - 7.7 K/uL   Lymphocytes Relative 31 %   Lymphs Abs 3.0 0.7 - 4.0 K/uL   Monocytes Relative 9 %   Monocytes Absolute 0.9 0.1 - 1.0 K/uL   Eosinophils Relative 5 %   Eosinophils Absolute 0.4 0.0 - 0.5 K/uL   Basophils Relative 1 %   Basophils Absolute 0.1 0.0 - 0.1 K/uL   Immature Granulocytes 0 %   Abs Immature Granulocytes 0.04 0.00 - 0.07 K/uL      Assessment & Plan:  Problem List Items Addressed This Visit       Respiratory   COPD, moderate ([place]) - Primary    Chronic, ongoing.  Continue Trelegy which offers benefit of medication minimization and has benefited symptoms.  Continue to collaborate with [name], recent notes reviewed.  No current daytime O2, at night only.      Pneumonia    Noted on recent imaging post flu -- at this time overall improvement in lung sounds and symptoms -- will plan on repeat imaging next Wednesday to ensure resolution presenting.  Continue to monitor, has completed abx treatment.      Relevant Orders   DG Chest 2 View     Endocrine   Type 2 diabetes, controlled, with neuropathy (HCC)    Chronic, ongoing, insulin dependent. Refer to insulin dependent diabetes plan.  Continue Gabapentin, however have discussed with patient due to her current CrCl (44.8) recommended dosing for Gabapentin is 900 MG total daily, she refuses to reduce this due to her chronic pain issues.  At this time will trial a dose of Requip 0.25 MG QHS for RLS-like symptoms + recommend she take Magnesium 400 MG at night == levels may be lower with her Lasix use.  Return in 4 weeks.        Other   Chronic pain syndrome    Continue to collaborate with chronic pain clinic in [facility].  Has morphine pump.          Follow up plan: Return in about 4 weeks (around 09/11/2021) for RLS.

## 2021-08-14 NOTE — Assessment & Plan Note (Signed)
Chronic, ongoing.  Continue Trelegy which offers benefit of medication minimization and has benefited symptoms.  Continue to collaborate with [name], recent notes reviewed.  No current daytime O2, at night only.

## 2021-08-14 NOTE — Assessment & Plan Note (Signed)
Continue to collaborate with chronic pain clinic in [place].  Has morphine pump.   

## 2021-08-14 NOTE — Assessment & Plan Note (Signed)
Noted on recent imaging post flu -- at this time overall improvement in lung sounds and symptoms -- will plan on repeat imaging next Wednesday to ensure resolution presenting.  Continue to monitor, has completed abx treatment.

## 2021-08-15 ENCOUNTER — Ambulatory Visit (INDEPENDENT_AMBULATORY_CARE_PROVIDER_SITE_OTHER): Payer: Medicare HMO | Admitting: Licensed Clinical Social Worker

## 2021-08-15 DIAGNOSIS — J449 Chronic obstructive pulmonary disease, unspecified: Secondary | ICD-10-CM

## 2021-08-15 DIAGNOSIS — F324 Major depressive disorder, single episode, in partial remission: Secondary | ICD-10-CM

## 2021-08-15 DIAGNOSIS — G894 Chronic pain syndrome: Secondary | ICD-10-CM

## 2021-08-15 DIAGNOSIS — I152 Hypertension secondary to endocrine disorders: Secondary | ICD-10-CM

## 2021-08-15 DIAGNOSIS — E1159 Type 2 diabetes mellitus with other circulatory complications: Secondary | ICD-10-CM

## 2021-08-15 DIAGNOSIS — F5101 Primary insomnia: Secondary | ICD-10-CM

## 2021-08-16 ENCOUNTER — Ambulatory Visit (INDEPENDENT_AMBULATORY_CARE_PROVIDER_SITE_OTHER): Payer: Medicare HMO

## 2021-08-16 ENCOUNTER — Other Ambulatory Visit: Payer: Self-pay

## 2021-08-16 DIAGNOSIS — E538 Deficiency of other specified B group vitamins: Secondary | ICD-10-CM | POA: Diagnosis not present

## 2021-08-16 MED ORDER — CYANOCOBALAMIN 1000 MCG/ML IJ SOLN
1000.0000 ug | Freq: Once | INTRAMUSCULAR | Status: AC
  Administered 2021-08-16: 1000 ug via INTRAMUSCULAR

## 2021-08-19 NOTE — Chronic Care Management (AMB) (Signed)
° °   Clinical Social Work  Care Management   Phone Outreach    08/19/2021 Name: [name] MRN: [id number] DOB: Jan 06, 1951  [name] is a 71 y.o. year old female who is a primary care patient of [name], [name], NP .   Reason for referral: Mental Health Counseling and Resources.    F/U phone call today to assess needs, progress and barriers with care plan goals.   Patient unable to keep phone appointment today and requested to reschedule.  Plan:Appointment was rescheduled with CCM LCSW   Review of patient status, including review of consultants reports, relevant laboratory and other test results, and collaboration with appropriate care team members and the patient's provider was performed as part of comprehensive patient evaluation and provision of care management services.    Christa See, MSW, [email or web address] Phone [phone] 7:39 AM

## 2021-08-21 ENCOUNTER — Ambulatory Visit
Admission: RE | Admit: 2021-08-21 | Discharge: 2021-08-21 | Disposition: A | Discharge status: Home / Self Care | Payer: Medicare HMO | Source: Ambulatory Visit | Attending: Nurse Practitioner | Admitting: Nurse Practitioner

## 2021-08-21 ENCOUNTER — Other Ambulatory Visit: Payer: Self-pay

## 2021-08-21 ENCOUNTER — Ambulatory Visit
Admission: RE | Admit: 2021-08-21 | Discharge: 2021-08-21 | Disposition: A | Discharge status: Home / Self Care | Payer: Medicare HMO | Attending: Nurse Practitioner | Admitting: Nurse Practitioner

## 2021-08-21 DIAGNOSIS — J1000 Influenza due to other identified influenza virus with unspecified type of pneumonia: Secondary | ICD-10-CM | POA: Insufficient documentation

## 2021-08-21 DIAGNOSIS — J189 Pneumonia, unspecified organism: Secondary | ICD-10-CM | POA: Diagnosis not present

## 2021-08-21 DIAGNOSIS — R0600 Dyspnea, unspecified: Secondary | ICD-10-CM | POA: Diagnosis not present

## 2021-08-21 DIAGNOSIS — S2231XA Fracture of one rib, right side, initial encounter for closed fracture: Secondary | ICD-10-CM | POA: Diagnosis not present

## 2021-08-21 NOTE — Progress Notes (Signed)
Good morning, please let Jordan Chan know I have good news.  Her pneumonia has all cleared up on recent imaging -- on review of image and report.  Wonderful news!!  Have a great day!! Keep being amazing!!  Thank you for allowing me to participate in your care.  I appreciate you. Kindest regards, Dotti Busey

## 2021-08-28 ENCOUNTER — Telehealth: Payer: Medicare HMO

## 2021-08-29 ENCOUNTER — Telehealth: Payer: Self-pay | Admitting: Licensed Clinical Social Worker

## 2021-08-29 NOTE — Telephone Encounter (Signed)
° °   Clinical Social Work  Care Management   Phone Outreach    08/29/2021 Name: COLETTA LOCKNER MRN: [id number] DOB: 01-Feb-1951  NUR RABOLD is a 71 y.o. year old female who is a primary care patient of [name], [name], NP .   Reason for referral: Mental Health Counseling and Resources.    F/U phone call today to assess needs, progress and barriers with care plan goals.   Telephone outreach was unsuccessful. A HIPPA compliant phone message was left for the patient providing contact information and requesting a return call.   Plan:CCM LCSW will wait for return call. If no return call is received, Will route chart to Care Guide to see if patient would like to reschedule phone appointment   Review of patient status, including review of consultants reports, relevant laboratory and other test results, and collaboration with appropriate care team members and the patient's provider was performed as part of comprehensive patient evaluation and provision of care management services.    Christa See, MSW, [email or web address] Phone [phone] 4:55 PM

## 2021-08-30 ENCOUNTER — Other Ambulatory Visit: Payer: Self-pay | Admitting: Nurse Practitioner

## 2021-08-30 NOTE — Telephone Encounter (Signed)
Requested Prescriptions  Pending Prescriptions Disp Refills   diclofenac (VOLTAREN) 50 MG EC tablet [Pharmacy Med Name: DICLOFENAC SODIUM DR 50 MG Tablet Delayed Release] 270 tablet 0    Sig: TAKE 1 TABLET THREE TIMES DAILY     Analgesics:  NSAIDS Passed - 08/30/2021  6:14 PM      Passed - Cr in normal range and within 360 days    Creatinine  Date Value Ref Range Status  09/14/2013 0.68 0.60 - 1.30 mg/dL Final   Creatinine, Ser  Date Value Ref Range Status  07/31/2021 0.97 0.57 - 1.00 mg/dL Final         Passed - HGB in normal range and within 360 days    Hemoglobin  Date Value Ref Range Status  08/13/2021 12.4 12.0 - 15.0 g/dL Final  07/31/2021 12.5 11.1 - 15.9 g/dL Final         Passed - Patient is not pregnant      Passed - Valid encounter within last 12 months    Recent Outpatient Visits          2 weeks ago COPD, moderate ([place])   [place], Everson T, NP   1 month ago Type 2 diabetes, controlled, with neuropathy ([place])   [place], Ojai T, NP   2 months ago Urinary symptom or sign   [place], [place] T, NP   4 months ago Type 2 diabetes, controlled, with neuropathy ([place])   [place], Jolene T, NP   7 months ago Type 2 diabetes, controlled, with neuropathy ([place])   [place], [name], NP      Future Appointments            In 2 weeks [name], [name], NP [employer], [place]   In 1 month End, Harrell Gave, MD [place], [place]   In 3 months  [employer], [place]

## 2021-09-02 ENCOUNTER — Telehealth: Payer: Medicare HMO

## 2021-09-02 ENCOUNTER — Telehealth: Payer: Self-pay

## 2021-09-02 NOTE — Progress Notes (Deleted)
Chronic Care Management Pharmacy Note  09/02/2021 Name:  Jordan Chan MRN:  [id number] DOB:  17-Mar-1951  Summary: ***  Subjective: Jordan Chan is an 71 y.o. year old female who is a primary patient of [name], [name], NP.  The CCM team was consulted for assistance with disease management and care coordination needs.    Engaged with patient by telephone for follow up visit in response to provider referral for pharmacy case management and/or care coordination services.   Consent to Services:  The patient was given information about Chronic Care Management services, agreed to services, and gave verbal consent prior to initiation of services.  Please see initial visit note for detailed documentation.   Patient Care Team: [name], NP as PCP - General (Nurse Practitioner) End, Harrell Gave, MD as PCP - Cardiology (Cardiology) Erby Pian, MD as Referring Physician (Pulmonary Disease) Kandace Blitz, MD as Referring Physician (Pain Medicine) Rebekah [place], LCSW as Social Worker (Licensed Clinical Social Worker) Madelin Rear, [facility] as Pharmacist (Pharmacist)  Hospital visits: None in previous 6 months  Objective:  Lab Results  Component Value Date   CREATININE 0.97 07/31/2021   CREATININE 0.90 04/29/2021   CREATININE 0.65 01/11/2021    Lab Results  Component Value Date   HGBA1C 8.0 (H) 07/31/2021   HGBA1C 8.8 (H) 04/29/2021   HGBA1C 7.7 (H) 01/25/2021   Last diabetic Eye exam:  Lab Results  Component Value Date/Time   HMDIABEYEEXA No Retinopathy 03/16/2021 12:00 AM    Last diabetic Foot exam: No results found for: HMDIABFOOTEX      Component Value Date/Time   CHOL 141 04/29/2021 0944   CHOL 151 10/25/2020 0845   CHOL 154 07/26/2020 0902   CHOL 127 07/26/2019 0817   CHOL 118 01/17/2019 0836   CHOL 132 10/01/2015 1012   TRIG 151 (H) 04/29/2021 0944   TRIG 104 10/25/2020 0845   TRIG 100 07/26/2020 0902   TRIG 104 07/26/2019 0817    TRIG 70 01/17/2019 0836   TRIG 79 10/01/2015 1012   HDL 50 04/29/2021 0944   HDL 73 10/25/2020 0845   HDL 73 07/26/2020 0902   VLDL 21 07/26/2019 0817   LDLCALC 65 04/29/2021 0944   LDLCALC 59 10/25/2020 0845   LDLCALC 63 07/26/2020 0902    Hepatic Function Latest Ref Rng & Units 04/29/2021 01/11/2021 11/29/2020  Total Protein 6.0 - 8.5 g/dL 7.1 8.5(H) 8.1  Albumin 3.7 - 4.7 g/dL 4.2 3.8 4.4  AST 0 - 40 IU/L 14 22 16   ALT 0 - 32 IU/L 11 13 10   Alk Phosphatase 44 - 121 IU/L 70 63 89  Total Bilirubin 0.0 - 1.2 mg/dL 0.2 0.5 <0.2    Lab Results  Component Value Date/Time   TSH 2.590 04/29/2021 09:44 AM   TSH 3.100 07/26/2020 09:02 AM    CBC Latest Ref Rng & Units 08/13/2021 07/31/2021 04/24/2021  WBC 4.0 - 10.5 K/uL 9.7 13.8(H) 10.1  Hemoglobin 12.0 - 15.0 g/dL 12.4 12.5 12.8  Hematocrit 36.0 - 46.0 % 38.5 38.0 40.1  Platelets 150 - 400 K/uL 387 421 386    Lab Results  Component Value Date/Time   VD25OH 41.8 04/29/2021 09:44 AM   VD25OH 33.4 10/25/2020 08:45 AM    Clinical ASCVD:  The 10-year ASCVD risk score (Arnett DK, et al., 2019) is: 14.6%   Values used to calculate the score:     Age: 37 years     Sex: Female  Is Non-Hispanic African American: No     Diabetic: Yes     Tobacco smoker: No     Systolic Blood Pressure: 98 mmHg     Is BP treated: Yes     HDL Cholesterol: 50 mg/dL     Total Cholesterol: 141 mg/dL   Social History   Tobacco Use  Smoking Status Former   Packs/day: 1.00   Years: 30.00   Pack years: 30.00   Types: Cigarettes   Quit date: 04/30/2004   Years since quitting: 17.3  Smokeless Tobacco Never   BP Readings from Last 3 Encounters:  08/14/21 98/62  08/13/21 (!) 114/55  07/31/21 108/62   Pulse Readings from Last 3 Encounters:  08/14/21 86  08/13/21 83  07/31/21 89   Wt Readings from Last 3 Encounters:  08/14/21 155 lb 3.2 oz (70.4 kg)  08/13/21 153 lb 14.4 oz (69.8 kg)  07/31/21 152 lb 3.2 oz (69 kg)   BMI Readings from  Last 3 Encounters:  08/14/21 31.88 kg/m  08/13/21 31.62 kg/m  07/31/21 31.27 kg/m    Assessment: Review of patient past medical history, allergies, medications, health status, including review of consultants reports, laboratory and other test data, was performed as part of comprehensive evaluation and provision of chronic care management services.   SDOH:  (Social Determinants of Health) assessments and interventions performed: Yes***   CCM Care Plan  Allergies  Allergen Reactions   Other Palpitations    IV steroids   Pain Patch [Menthol] Anaphylaxis   Fentanyl    Avelox [Moxifloxacin Hcl In Nacl] Other (See Comments)    Upset stomach   Doxycycline Diarrhea   Erythromycin Nausea Only and Other (See Comments)    Can take a Z-Pak just fine Other reaction(s): Other (See Comments) Can take Z-Pak  Can take a Z-Pak just fine   Moxifloxacin Hcl Other (See Comments)    Upset stomach Upset stomach   Oxycontin [Oxycodone] Hives   Ozempic [Semaglutide] Nausea Only    Medications Reviewed Today     Reviewed by [name], NP (Nurse Practitioner) on 08/14/21 at 1146  Med List Status: <None>   Medication Order Taking? Sig Documenting Provider Last Dose Status Informant  ACCU-CHEK AVIVA PLUS test strip [id number] Yes TEST THREE TIMES DAILY [name] T, NP Taking Active   Accu-Chek Softclix Lancets lancets [id number] Yes TEST BLOOD SUGAR THREE TIMES DAILY [name], Jolene T, NP Taking Active   albuterol (PROVENTIL HFA;VENTOLIN HFA) 108 (90 BASE) MCG/ACT inhaler [id number] Yes Inhale 2 puffs into the lungs every 6 (six) hours as needed for wheezing or shortness of breath.  [provider] Taking Active Self  Alcohol Swabs (B-D SINGLE USE SWABS REGULAR) PADS [id number] Yes USE TWICE DAILY  WITH  SUGAR  CHECKS [name], Jolene T, NP Taking Active   atorvastatin (LIPITOR) 20 MG tablet [id number] Yes Take 1 tablet (20 mg total) by mouth daily. [name] T, NP Taking  Active   Blood Glucose Monitoring Suppl (TRUE METRIX METER) w/Device KIT [id number] Yes Use to check blood sugar 4 times a day [name], Jolene T, NP Taking Active   cholecalciferol (VITAMIN D3) 25 MCG (1000 UNIT) tablet [id number] Yes Take 1,000 Units by mouth daily. [provider] Taking Active Self  Cyanocobalamin 1000 MCG/ML KIT [id number] Yes Inject 1,000 mcg as directed every 30 (thirty) days. [provider] Taking Active Self  cyclobenzaprine (FLEXERIL) 5 MG tablet [id number] Yes Take 5 mg by mouth 2 (two) times daily as needed for  muscle spasms. Takes very rarely [provider] Taking Active Self           Med Note ([facility] Jul 19, 2019  9:07 AM)    diclofenac (VOLTAREN) 50 MG EC tablet [id number] Yes Take 1 tablet (50 mg total) by mouth 3 (three) times daily. [name] T, NP Taking Active   Dulaglutide (TRULICITY) 4.5 HE/1.7EY SOPN [id number] Yes Inject 4.5 mg as directed once a week. [name] T, NP Taking Active   folic acid (FOLVITE) 1 MG tablet [id number] Yes TAKE 1 TABLET EVERY DAY Sindy Guadeloupe, MD Taking Active   furosemide (LASIX) 40 MG tablet [id number] Yes TAKE 1 TABLET EVERY DAY End, Christopher, MD Taking Active   gabapentin (NEURONTIN) 800 MG tablet [id number] Yes Take 1 tablet (800 mg total) by mouth 3 (three) times daily. [name] T, NP Taking Active Self  lactulose (CHRONULAC) 10 GM/15ML solution [id number] Yes Take 45 mLs (30 g total) by mouth daily as needed for mild constipation or severe constipation. Loletha Grayer, MD Taking Active Self  metFORMIN (GLUCOPHAGE) 1000 MG tablet [id number] Yes Take 1 tablet (1,000 mg total) by mouth 2 (two) times daily with a meal. [name], Jolene T, NP Taking Active Self  metoprolol succinate (TOPROL-XL) 25 MG 24 hr tablet [id number] Yes TAKE 1/2 TABLET EVERY DAY Dunn, Ryan M, PA-C Taking Active   montelukast (SINGULAIR) 10 MG tablet [id number] Yes Take 10 mg by mouth at bedtime.  [provider] Taking Active Self  PAIN MANAGEMENT INTRATHECAL, IT, PUMP [id number] Yes 1 each by Intrathecal route. Intrathecal (IT) medication:  Morphine Patient does not remember current. Adjusted every 2.5 months at [facility] in [place]. [provider] Taking Active Self  rOPINIRole (REQUIP) 0.25 MG tablet [id number] Yes Take 1 tablet (0.25 mg total) by mouth at bedtime. [name] T, NP  Active   TRELEGY ELLIPTA 100-62.5-25 MCG/INH AEPB [id number] Yes INHALE 1 PUFF INTO THE LUNGS DAILY. [name] T, NP Taking Active Self  TRESIBA FLEXTOUCH 100 UNIT/ML FlexTouch Pen [id number] Yes INJECT 30 UNITS INTO THE SKIN AT BEDTIME [name], Jolene T, NP Taking Active   venlafaxine XR (EFFEXOR-XR) 150 MG 24 hr capsule [id number] Yes Take 1 capsule (150 mg total) by mouth daily. [name], NP Taking Active Self            Patient Active Problem List   Diagnosis Date Noted   Night sweats 07/31/2021   Pneumonia 07/31/2021   Chronic HFrEF (heart failure with reduced ejection fraction) ([place]) 01/18/2021   Iron deficiency anemia 12/28/2020   Type 2 diabetes, controlled, with neuropathy ([place]) 07/20/2020   Insulin dependent type 2 diabetes mellitus ([place]) 07/20/2020   History of pulmonary embolus (PE) 07/04/2020   Aortic atherosclerosis ([place]) 07/01/2020   SVT (supraventricular tachycardia) ([place])    Vitamin D deficiency 07/17/2019   History of non-ST elevation myocardial infarction (NSTEMI)    Presence of intrathecal pump 04/27/2017   Advanced care planning/counseling discussion 11/14/2016   Cervical scoliosis 05/20/2016   Hypertension associated with diabetes ([place]) 06/18/2015   B12 deficiency 03/19/2015   Sleep apnea 11/16/2014   Allergic rhinitis 11/16/2014   History of breast cancer 11/16/2014   GERD (gastroesophageal reflux disease) 11/16/2014   COPD, moderate ([place]) 11/16/2014   Obesity 11/16/2014   Depression, major, single episode, in  partial remission ([place]) 11/16/2014   Insomnia 11/16/2014   Hyperlipidemia associated with type 2 diabetes mellitus ([place]) 11/16/2014   History of  sarcoma 09/27/2013   Chronic pain syndrome 09/30/2012    Immunization History  Administered Date(s) Administered   Fluad Quad(high Dose 65+) 04/19/2019, 07/26/2020, 04/29/2021   Influenza, High Dose Seasonal PF 04/28/2016, 04/13/2017, 04/14/2018   Influenza,inj,Quad PF,6+ Mos 06/18/2015   Influenza-Unspecified 04/04/2014   PFIZER(Purple Top)SARS-COV-2 Vaccination 10/20/2019, 11/10/2019, 05/31/2020   Pneumococcal Conjugate-13 06/18/2015   Pneumococcal Polysaccharide-23 08/06/2005, 08/05/2016   Td 08/28/2007, 01/17/2019   Zoster Recombinat (Shingrix) 04/29/2021, 07/23/2021    Conditions to be addressed/monitored: HFrEF HTN DM HLD SVT HLD PE Hx   There are no care plans that you recently modified to display for this patient.  Current Barriers:  {pharmacybarriers:24917}  Pharmacist Clinical Goal(s):  Patient will {PHARMACYGOALCHOICES:24921} through collaboration with PharmD and provider.   Interventions: 1:1 collaboration with [name], NP regarding development and update of comprehensive plan of care as evidenced by provider attestation and co-signature Inter-disciplinary care team collaboration (see longitudinal plan of care) Comprehensive medication review performed; medication list updated in electronic medical record  {CCM PHARMD DISEASE STATES:25130}  Patient Goals/Self-Care Activities Patient will:  - {pharmacypatientgoals:24919}  Medication Assistance: {MEDASSISTANCEINFO:25044}  Patient's preferred pharmacy is:  [facility] [place], [place] [address] Phone: [phone] Fax: [phone]  [place], [place] - [address] [place] [address] Phone: [phone] Fax: [phone]   Pt endorses  ***% compliance  Follow Up:  Patient agrees to Care Plan and Follow-up. Plan: [facility] ***. Pharmacist ***  Future Appointments  Date Time Provider [place]  09/02/2021  9:00 AM CFP CCM PHARMACY [facility]  09/17/2021  8:40 AM [name] T, NP [facility]  10/09/2021  9:20 AM End, Harrell Gave, MD [facility]  12/16/2021  9:00 AM CFP NURSE HEALTH ADVISOR [facility]  02/11/2022 10:00 AM [facility] None  02/11/2022 10:30 AM Sindy Guadeloupe, MD [facility] None    {jpsigs:26361}

## 2021-09-02 NOTE — Telephone Encounter (Signed)
°  Care Management   Follow Up Note   09/02/2021 Name: Jordan Chan MRN: [id number] DOB: 11/24/1950   Referred by: [name], NP Reason for referral : Chronic Care Management   An unsuccessful telephone outreach was attempted today. The patient was referred to the case management team for assistance with care management and care coordination.   Follow Up Plan:  Patient provided with call back number. Care guide notified of RS.   Madelin Rear, PharmD, [place] Clinical Pharmacist  [facility]  [phone]

## 2021-09-03 DIAGNOSIS — F324 Major depressive disorder, single episode, in partial remission: Secondary | ICD-10-CM

## 2021-09-03 DIAGNOSIS — E1159 Type 2 diabetes mellitus with other circulatory complications: Secondary | ICD-10-CM

## 2021-09-03 DIAGNOSIS — J449 Chronic obstructive pulmonary disease, unspecified: Secondary | ICD-10-CM

## 2021-09-03 DIAGNOSIS — I152 Hypertension secondary to endocrine disorders: Secondary | ICD-10-CM

## 2021-09-05 ENCOUNTER — Emergency Department: Payer: Medicare HMO

## 2021-09-05 ENCOUNTER — Encounter: Payer: Self-pay | Admitting: Emergency Medicine

## 2021-09-05 ENCOUNTER — Other Ambulatory Visit: Payer: Self-pay

## 2021-09-05 ENCOUNTER — Inpatient Hospital Stay
Admission: EM | Admit: 2021-09-05 | Discharge: 2021-09-08 | DRG: 871 | Disposition: A | Discharge status: Home / Self Care | Payer: Medicare HMO | Attending: Obstetrics and Gynecology | Admitting: Obstetrics and Gynecology

## 2021-09-05 ENCOUNTER — Inpatient Hospital Stay: Payer: Medicare HMO

## 2021-09-05 ENCOUNTER — Ambulatory Visit: Payer: Self-pay | Admitting: *Deleted

## 2021-09-05 DIAGNOSIS — Z9010 Acquired absence of unspecified breast and nipple: Secondary | ICD-10-CM

## 2021-09-05 DIAGNOSIS — E871 Hypo-osmolality and hyponatremia: Secondary | ICD-10-CM | POA: Diagnosis present

## 2021-09-05 DIAGNOSIS — Z885 Allergy status to narcotic agent status: Secondary | ICD-10-CM

## 2021-09-05 DIAGNOSIS — Z923 Personal history of irradiation: Secondary | ICD-10-CM

## 2021-09-05 DIAGNOSIS — A419 Sepsis, unspecified organism: Principal | ICD-10-CM | POA: Diagnosis present

## 2021-09-05 DIAGNOSIS — E1140 Type 2 diabetes mellitus with diabetic neuropathy, unspecified: Secondary | ICD-10-CM | POA: Diagnosis present

## 2021-09-05 DIAGNOSIS — Z20822 Contact with and (suspected) exposure to covid-19: Secondary | ICD-10-CM | POA: Diagnosis not present

## 2021-09-05 DIAGNOSIS — E876 Hypokalemia: Secondary | ICD-10-CM | POA: Diagnosis present

## 2021-09-05 DIAGNOSIS — Z79899 Other long term (current) drug therapy: Secondary | ICD-10-CM

## 2021-09-05 DIAGNOSIS — K219 Gastro-esophageal reflux disease without esophagitis: Secondary | ICD-10-CM | POA: Diagnosis present

## 2021-09-05 DIAGNOSIS — E7800 Pure hypercholesterolemia, unspecified: Secondary | ICD-10-CM | POA: Diagnosis present

## 2021-09-05 DIAGNOSIS — J129 Viral pneumonia, unspecified: Secondary | ICD-10-CM | POA: Diagnosis present

## 2021-09-05 DIAGNOSIS — N1831 Chronic kidney disease, stage 3a: Secondary | ICD-10-CM | POA: Diagnosis present

## 2021-09-05 DIAGNOSIS — J9621 Acute and chronic respiratory failure with hypoxia: Secondary | ICD-10-CM | POA: Diagnosis present

## 2021-09-05 DIAGNOSIS — J189 Pneumonia, unspecified organism: Secondary | ICD-10-CM

## 2021-09-05 DIAGNOSIS — E119 Type 2 diabetes mellitus without complications: Secondary | ICD-10-CM

## 2021-09-05 DIAGNOSIS — J440 Chronic obstructive pulmonary disease with acute lower respiratory infection: Secondary | ICD-10-CM | POA: Diagnosis present

## 2021-09-05 DIAGNOSIS — Z79890 Hormone replacement therapy (postmenopausal): Secondary | ICD-10-CM

## 2021-09-05 DIAGNOSIS — R069 Unspecified abnormalities of breathing: Secondary | ICD-10-CM | POA: Diagnosis not present

## 2021-09-05 DIAGNOSIS — R059 Cough, unspecified: Secondary | ICD-10-CM | POA: Diagnosis not present

## 2021-09-05 DIAGNOSIS — E1122 Type 2 diabetes mellitus with diabetic chronic kidney disease: Secondary | ICD-10-CM | POA: Diagnosis not present

## 2021-09-05 DIAGNOSIS — G4730 Sleep apnea, unspecified: Secondary | ICD-10-CM | POA: Diagnosis present

## 2021-09-05 DIAGNOSIS — Z853 Personal history of malignant neoplasm of breast: Secondary | ICD-10-CM

## 2021-09-05 DIAGNOSIS — Z794 Long term (current) use of insulin: Secondary | ICD-10-CM

## 2021-09-05 DIAGNOSIS — N183 Chronic kidney disease, stage 3 unspecified: Secondary | ICD-10-CM | POA: Diagnosis not present

## 2021-09-05 DIAGNOSIS — E872 Acidosis, unspecified: Secondary | ICD-10-CM | POA: Diagnosis not present

## 2021-09-05 DIAGNOSIS — Z7985 Long-term (current) use of injectable non-insulin antidiabetic drugs: Secondary | ICD-10-CM

## 2021-09-05 DIAGNOSIS — I130 Hypertensive heart and chronic kidney disease with heart failure and stage 1 through stage 4 chronic kidney disease, or unspecified chronic kidney disease: Secondary | ICD-10-CM | POA: Diagnosis not present

## 2021-09-05 DIAGNOSIS — R0602 Shortness of breath: Secondary | ICD-10-CM | POA: Diagnosis not present

## 2021-09-05 DIAGNOSIS — R000 Tachycardia, unspecified: Secondary | ICD-10-CM | POA: Diagnosis not present

## 2021-09-05 DIAGNOSIS — B9781 Human metapneumovirus as the cause of diseases classified elsewhere: Secondary | ICD-10-CM | POA: Diagnosis present

## 2021-09-05 DIAGNOSIS — I5022 Chronic systolic (congestive) heart failure: Secondary | ICD-10-CM | POA: Diagnosis present

## 2021-09-05 DIAGNOSIS — G8929 Other chronic pain: Secondary | ICD-10-CM | POA: Diagnosis present

## 2021-09-05 DIAGNOSIS — Z9981 Dependence on supplemental oxygen: Secondary | ICD-10-CM | POA: Diagnosis not present

## 2021-09-05 DIAGNOSIS — J441 Chronic obstructive pulmonary disease with (acute) exacerbation: Secondary | ICD-10-CM | POA: Diagnosis not present

## 2021-09-05 DIAGNOSIS — Z9689 Presence of other specified functional implants: Secondary | ICD-10-CM | POA: Diagnosis present

## 2021-09-05 DIAGNOSIS — R739 Hyperglycemia, unspecified: Secondary | ICD-10-CM | POA: Diagnosis not present

## 2021-09-05 DIAGNOSIS — F329 Major depressive disorder, single episode, unspecified: Secondary | ICD-10-CM | POA: Diagnosis present

## 2021-09-05 DIAGNOSIS — R0902 Hypoxemia: Secondary | ICD-10-CM | POA: Diagnosis not present

## 2021-09-05 DIAGNOSIS — Z90710 Acquired absence of both cervix and uterus: Secondary | ICD-10-CM

## 2021-09-05 DIAGNOSIS — Z888 Allergy status to other drugs, medicaments and biological substances status: Secondary | ICD-10-CM

## 2021-09-05 DIAGNOSIS — Z87891 Personal history of nicotine dependence: Secondary | ICD-10-CM

## 2021-09-05 DIAGNOSIS — J449 Chronic obstructive pulmonary disease, unspecified: Secondary | ICD-10-CM | POA: Diagnosis not present

## 2021-09-05 DIAGNOSIS — Z9221 Personal history of antineoplastic chemotherapy: Secondary | ICD-10-CM

## 2021-09-05 DIAGNOSIS — I700 Atherosclerosis of aorta: Secondary | ICD-10-CM | POA: Diagnosis not present

## 2021-09-05 DIAGNOSIS — M1990 Unspecified osteoarthritis, unspecified site: Secondary | ICD-10-CM | POA: Diagnosis present

## 2021-09-05 DIAGNOSIS — Z7951 Long term (current) use of inhaled steroids: Secondary | ICD-10-CM

## 2021-09-05 DIAGNOSIS — Z8673 Personal history of transient ischemic attack (TIA), and cerebral infarction without residual deficits: Secondary | ICD-10-CM

## 2021-09-05 DIAGNOSIS — J45909 Unspecified asthma, uncomplicated: Secondary | ICD-10-CM | POA: Diagnosis not present

## 2021-09-05 DIAGNOSIS — Z881 Allergy status to other antibiotic agents status: Secondary | ICD-10-CM

## 2021-09-05 LAB — COMPREHENSIVE METABOLIC PANEL
ALT: 12 U/L (ref 0–44)
AST: 19 U/L (ref 15–41)
Albumin: 3.7 g/dL (ref 3.5–5.0)
Alkaline Phosphatase: 58 U/L (ref 38–126)
Anion gap: 12 (ref 5–15)
BUN: 14 mg/dL (ref 8–23)
CO2: 29 mmol/L (ref 22–32)
Calcium: 8.5 mg/dL — ABNORMAL LOW (ref 8.9–10.3)
Chloride: 91 mmol/L — ABNORMAL LOW (ref 98–111)
Creatinine, Ser: 0.89 mg/dL (ref 0.44–1.00)
GFR, Estimated: >60 mL/min (ref >60)
Glucose, Bld: 287 mg/dL — ABNORMAL HIGH (ref 70–99)
Potassium: 3.2 mmol/L — ABNORMAL LOW (ref 3.5–5.1)
Sodium: 132 mmol/L — ABNORMAL LOW (ref 135–145)
Total Bilirubin: 0.5 mg/dL (ref 0.3–1.2)
Total Protein: 8.2 g/dL — ABNORMAL HIGH (ref 6.5–8.1)

## 2021-09-05 LAB — CBC WITH DIFFERENTIAL/PLATELET
Abs Immature Granulocytes: 0.11 10*3/uL — ABNORMAL HIGH (ref 0.00–0.07)
Basophils Absolute: 0.1 10*3/uL (ref 0.0–0.1)
Basophils Relative: 0 %
Eosinophils Absolute: 0.2 10*3/uL (ref 0.0–0.5)
Eosinophils Relative: 1 %
HCT: 39.1 % (ref 36.0–46.0)
Hemoglobin: 12.6 g/dL (ref 12.0–15.0)
Immature Granulocytes: 1 %
Lymphocytes Relative: 20 %
Lymphs Abs: 4.2 10*3/uL — ABNORMAL HIGH (ref 0.7–4.0)
MCH: 30.7 pg (ref 26.0–34.0)
MCHC: 32.2 g/dL (ref 30.0–36.0)
MCV: 95.4 fL (ref 80.0–100.0)
Monocytes Absolute: 1.3 10*3/uL — ABNORMAL HIGH (ref 0.1–1.0)
Monocytes Relative: 6 %
Neutro Abs: 15.4 10*3/uL — ABNORMAL HIGH (ref 1.7–7.7)
Neutrophils Relative %: 72 %
Platelets: 428 10*3/uL — ABNORMAL HIGH (ref 150–400)
RBC: 4.1 MIL/uL (ref 3.87–5.11)
RDW: 13.6 % (ref 11.5–15.5)
WBC: 21.3 10*3/uL — ABNORMAL HIGH (ref 4.0–10.5)
nRBC: 0 % (ref 0.0–0.2)

## 2021-09-05 LAB — RESP PANEL BY RT-PCR (FLU A&B, COVID) ARPGX2
Influenza A by PCR: NEGATIVE
Influenza B by PCR: NEGATIVE
SARS Coronavirus 2 by RT PCR: NEGATIVE

## 2021-09-05 LAB — LACTIC ACID, PLASMA
Lactic Acid, Venous: 2.2 mmol/L (ref 0.5–1.9)
Lactic Acid, Venous: 1.2 mmol/L (ref 0.5–1.9)

## 2021-09-05 LAB — HEMOGLOBIN A1C
Hgb A1c MFr Bld: 8 % — ABNORMAL HIGH (ref 4.8–5.6)
Mean Plasma Glucose: 183 mg/dL

## 2021-09-05 LAB — GLUCOSE, CAPILLARY
Glucose-Capillary: 332 mg/dL — ABNORMAL HIGH (ref 70–99)
Glucose-Capillary: 268 mg/dL — ABNORMAL HIGH (ref 70–99)

## 2021-09-05 LAB — TROPONIN I (HIGH SENSITIVITY)
Troponin I (High Sensitivity): 7 ng/L (ref <18)
Troponin I (High Sensitivity): 9 ng/L (ref <18)

## 2021-09-05 LAB — LIPASE, BLOOD: Lipase: 32 U/L (ref 11–51)

## 2021-09-05 LAB — BRAIN NATRIURETIC PEPTIDE: B Natriuretic Peptide: 29.7 pg/mL (ref 0.0–100.0)

## 2021-09-05 MED ORDER — SODIUM CHLORIDE 0.9 % IV SOLN: 500.0000 mg | INTRAVENOUS | Status: DC

## 2021-09-05 MED ORDER — UMECLIDINIUM BROMIDE 62.5 MCG/ACT IN AEPB
1.0000 | INHALATION_SPRAY | Freq: Every day | RESPIRATORY_TRACT | Status: DC
  Filled 2021-09-05: qty 7

## 2021-09-05 MED ORDER — FUROSEMIDE 40 MG PO TABS
40.0000 mg | ORAL_TABLET | Freq: Every day | ORAL | Status: DC
Start: 2021-09-05 — End: 2021-09-08
  Administered 2021-09-05 – 2021-09-08 (×4): 40 mg via ORAL
  Filled 2021-09-05 (×4): qty 1

## 2021-09-05 MED ORDER — ROPINIROLE HCL 0.25 MG PO TABS
0.2500 mg | ORAL_TABLET | Freq: Every day | ORAL | Status: DC
  Administered 2021-09-05 – 2021-09-07 (×2): 0.25 mg via ORAL
  Filled 2021-09-05 (×4): qty 1

## 2021-09-05 MED ORDER — FLUTICASONE FUROATE-VILANTEROL 100-25 MCG/ACT IN AEPB
1.0000 | INHALATION_SPRAY | Freq: Every day | RESPIRATORY_TRACT | Status: DC
  Administered 2021-09-06 – 2021-09-08 (×3): 1 via RESPIRATORY_TRACT
  Filled 2021-09-05: qty 28

## 2021-09-05 MED ORDER — CYCLOBENZAPRINE HCL 10 MG PO TABS
5.0000 mg | ORAL_TABLET | Freq: Two times a day (BID) | ORAL | Status: DC | PRN
  Administered 2021-09-06 – 2021-09-07 (×2): 5 mg via ORAL
  Filled 2021-09-05 (×2): qty 1

## 2021-09-05 MED ORDER — LACTATED RINGERS IV SOLN: INTRAVENOUS | Status: DC

## 2021-09-05 MED ORDER — ENOXAPARIN SODIUM 40 MG/0.4ML IJ SOSY
40.0000 mg | PREFILLED_SYRINGE | INTRAMUSCULAR | Status: DC
  Administered 2021-09-05 – 2021-09-07 (×3): 40 mg via SUBCUTANEOUS
  Filled 2021-09-05 (×3): qty 0.4

## 2021-09-05 MED ORDER — ATORVASTATIN CALCIUM 20 MG PO TABS
20.0000 mg | ORAL_TABLET | Freq: Every day | ORAL | Status: DC
  Administered 2021-09-05 – 2021-09-08 (×4): 20 mg via ORAL
  Filled 2021-09-05 (×4): qty 1

## 2021-09-05 MED ORDER — POTASSIUM CHLORIDE CRYS ER 20 MEQ PO TBCR
40.0000 meq | EXTENDED_RELEASE_TABLET | Freq: Once | ORAL | Status: AC
  Administered 2021-09-05: 40 meq via ORAL
  Filled 2021-09-05: qty 2

## 2021-09-05 MED ORDER — SODIUM CHLORIDE 0.9 % IV SOLN
500.0000 mg | INTRAVENOUS | Status: DC
  Administered 2021-09-06: 500 mg via INTRAVENOUS
  Filled 2021-09-05: qty 5
  Filled 2021-09-05: qty 500

## 2021-09-05 MED ORDER — VITAMIN D 25 MCG (1000 UNIT) PO TABS
1000.0000 [IU] | ORAL_TABLET | Freq: Every day | ORAL | Status: DC
  Administered 2021-09-05 – 2021-09-08 (×4): 1000 [IU] via ORAL
  Filled 2021-09-05 (×4): qty 1

## 2021-09-05 MED ORDER — VENLAFAXINE HCL ER 75 MG PO CP24
150.0000 mg | ORAL_CAPSULE | Freq: Every day | ORAL | Status: DC
  Administered 2021-09-05 – 2021-09-08 (×4): 150 mg via ORAL
  Filled 2021-09-05: qty 1
  Filled 2021-09-05 (×4): qty 2

## 2021-09-05 MED ORDER — ACETAMINOPHEN 650 MG RE SUPP: 650.0000 mg | Freq: Four times a day (QID) | RECTAL | Status: DC | PRN

## 2021-09-05 MED ORDER — FOLIC ACID 1 MG PO TABS
1.0000 mg | ORAL_TABLET | Freq: Every day | ORAL | Status: DC
  Administered 2021-09-05 – 2021-09-08 (×4): 1 mg via ORAL
  Filled 2021-09-05 (×4): qty 1

## 2021-09-05 MED ORDER — SODIUM CHLORIDE 0.9 % IV BOLUS
1000.0000 mL | Freq: Once | INTRAVENOUS | Status: AC
  Administered 2021-09-05: 1000 mL via INTRAVENOUS

## 2021-09-05 MED ORDER — DICLOFENAC SODIUM 25 MG PO TBEC
50.0000 mg | DELAYED_RELEASE_TABLET | Freq: Three times a day (TID) | ORAL | Status: DC
  Administered 2021-09-05 – 2021-09-08 (×9): 50 mg via ORAL
  Filled 2021-09-05 (×14): qty 2

## 2021-09-05 MED ORDER — SODIUM CHLORIDE 0.9 % IV SOLN
2.0000 g | INTRAVENOUS | Status: DC
  Administered 2021-09-06: 2 g via INTRAVENOUS
  Filled 2021-09-05: qty 20
  Filled 2021-09-05: qty 2

## 2021-09-05 MED ORDER — METOPROLOL SUCCINATE ER 25 MG PO TB24
12.5000 mg | ORAL_TABLET | Freq: Every day | ORAL | Status: DC
  Administered 2021-09-05 – 2021-09-08 (×4): 12.5 mg via ORAL
  Filled 2021-09-05: qty 1
  Filled 2021-09-05: qty 0.5
  Filled 2021-09-05 (×3): qty 1

## 2021-09-05 MED ORDER — VANCOMYCIN HCL 1500 MG/300ML IV SOLN
1500.0000 mg | Freq: Once | INTRAVENOUS | Status: AC
  Administered 2021-09-05: 1500 mg via INTRAVENOUS
  Filled 2021-09-05: qty 300

## 2021-09-05 MED ORDER — ACETAMINOPHEN 325 MG PO TABS
650.0000 mg | ORAL_TABLET | Freq: Four times a day (QID) | ORAL | Status: DC | PRN
  Administered 2021-09-06 – 2021-09-07 (×2): 650 mg via ORAL
  Filled 2021-09-05 (×2): qty 2

## 2021-09-05 MED ORDER — MONTELUKAST SODIUM 10 MG PO TABS
10.0000 mg | ORAL_TABLET | Freq: Every day | ORAL | Status: DC
  Administered 2021-09-05 – 2021-09-07 (×3): 10 mg via ORAL
  Filled 2021-09-05 (×3): qty 1

## 2021-09-05 MED ORDER — INSULIN ASPART 100 UNIT/ML IJ SOLN
0.0000 [IU] | Freq: Three times a day (TID) | INTRAMUSCULAR | Status: DC
  Administered 2021-09-05: 11 [IU] via SUBCUTANEOUS
  Administered 2021-09-06: 2 [IU] via SUBCUTANEOUS
  Administered 2021-09-06: 5 [IU] via SUBCUTANEOUS
  Administered 2021-09-06: 15 [IU] via SUBCUTANEOUS
  Filled 2021-09-05 (×4): qty 1

## 2021-09-05 MED ORDER — LEVALBUTEROL HCL 0.63 MG/3ML IN NEBU
0.6300 mg | INHALATION_SOLUTION | Freq: Once | RESPIRATORY_TRACT | Status: AC
  Administered 2021-09-05: 0.63 mg via RESPIRATORY_TRACT
  Filled 2021-09-05: qty 3

## 2021-09-05 MED ORDER — GUAIFENESIN ER 600 MG PO TB12
1200.0000 mg | ORAL_TABLET | Freq: Two times a day (BID) | ORAL | Status: DC
  Administered 2021-09-05 – 2021-09-08 (×7): 1200 mg via ORAL
  Filled 2021-09-05 (×7): qty 2

## 2021-09-05 MED ORDER — GABAPENTIN 400 MG PO CAPS
800.0000 mg | ORAL_CAPSULE | Freq: Three times a day (TID) | ORAL | Status: DC
  Administered 2021-09-05 – 2021-09-08 (×9): 800 mg via ORAL
  Filled 2021-09-05 (×7): qty 8
  Filled 2021-09-05: qty 2
  Filled 2021-09-05: qty 8
  Filled 2021-09-05: qty 2

## 2021-09-05 MED ORDER — PREDNISONE 20 MG PO TABS: 40.0000 mg | ORAL_TABLET | Freq: Once | ORAL | Status: DC

## 2021-09-05 MED ORDER — VANCOMYCIN HCL IN DEXTROSE 1-5 GM/200ML-% IV SOLN: 1000.0000 mg | Freq: Once | INTRAVENOUS | Status: DC

## 2021-09-05 MED ORDER — ONDANSETRON HCL 4 MG PO TABS: 4.0000 mg | ORAL_TABLET | Freq: Four times a day (QID) | ORAL | Status: DC | PRN

## 2021-09-05 MED ORDER — ONDANSETRON HCL 4 MG/2ML IJ SOLN
4.0000 mg | Freq: Four times a day (QID) | INTRAMUSCULAR | Status: DC | PRN
Start: 2021-09-05 — End: 2021-09-08

## 2021-09-05 MED ORDER — SODIUM CHLORIDE 0.9 % IV SOLN: 2.0000 g | INTRAVENOUS | Status: DC

## 2021-09-05 MED ORDER — LACTATED RINGERS IV SOLN: INTRAVENOUS | Status: AC

## 2021-09-05 MED ORDER — SODIUM CHLORIDE 0.9 % IV SOLN
2.0000 g | Freq: Once | INTRAVENOUS | Status: AC
  Administered 2021-09-05: 2 g via INTRAVENOUS
  Filled 2021-09-05: qty 2

## 2021-09-05 NOTE — ED Notes (Signed)
2nd blood culture attempted without success, lab called to collect second blood culture.

## 2021-09-05 NOTE — Progress Notes (Signed)
CODE SEPSIS - PHARMACY COMMUNICATION  **Broad Spectrum Antibiotics should be administered within 1 hour of Sepsis diagnosis**  Time Code Sepsis Called/Page Received: 1252  Antibiotics Ordered: vancomycin and cefepime  Time of 1st antibiotic administration: Balta ,PharmD Clinical Pharmacist  09/05/2021  12:54 PM

## 2021-09-05 NOTE — Sepsis Progress Note (Signed)
eLink monitoring code sepsis.  

## 2021-09-05 NOTE — Sepsis Progress Note (Addendum)
Notified bedside nurse of need to draw repeat lactic acid.  RN already aware and will do as soon as possible. Per RN, had PIV blow, and still waiting on fluid bolus to finish.

## 2021-09-05 NOTE — ED Provider Notes (Signed)
[facility] Provider Note    Event Date/Time   First MD Initiated Contact with Patient 09/05/21 1059     (approximate)   History   Chief Complaint Shortness of Breath and Cough   HPI Jordan Chan is a 71 y.o. female, history of COPD, CHF, GERD, diabetes, hypertension, presents to the emergency department for evaluation of respiratory distress.  Patient states that she has been experiencing flulike symptoms for the past 3 days.  Endorses cough, congestion, and shortness of breath.  She states that this morning she has had a couple episodes of vomiting as well and feels particularly ill.  Denies sick exposures.  She called EMS.  Denies fever/chills, chest pain, abdominal pain, back pain, urinary symptoms, headache, or dizziness.  Per EMS, her oxygen saturation was in the 70s.  Placed her on oxygen, gave 2 DuoNeb's, and 125mg   of Solu-Medrol.   Per external records review, patient last had echocardiogram on 12/11/2020.  Ejection fraction 45 to 50%.  History Limitations: No limitations      Physical Exam  Triage Vital Signs: ED Triage Vitals  Enc Vitals Group     BP      Pulse      Resp      Temp      Temp src      SpO2      Weight      Height      Head Circumference      Peak Flow      Pain Score      Pain Loc      Pain Edu?      Excl. in [place]?     Most recent vital signs: Vitals:   09/05/21 1315 09/05/21 1359  BP: (!) 103/50 (!) 107/59  Pulse: (!) 119 (!) 118  Resp: (!) 23 17  Temp:  99.2 F (37.3 C)  SpO2: 100% 100%    General: Awake, uncomfortable CV: Good peripheral perfusion.  Resp: Labored breathing, patient is able to speak in full sentences.  Auscultation reveals wheezing and rhonchi bilaterally. Abd: Soft, non-tender. No distention.  Neuro: At baseline. No gross neurological deficits. Other: No chest wall tenderness.   Physical Exam    ED Results / Procedures / Treatments  Labs (all labs ordered are listed, but  only abnormal results are displayed) Labs Reviewed  COMPREHENSIVE METABOLIC PANEL - Abnormal; Notable for the following components:      Result Value   Sodium 132 (*)    Potassium 3.2 (*)    Chloride 91 (*)    Glucose, Bld 287 (*)    Calcium 8.5 (*)    Total Protein 8.2 (*)    All other components within normal limits  CBC WITH DIFFERENTIAL/PLATELET - Abnormal; Notable for the following components:   WBC 21.3 (*)    Platelets 428 (*)    Neutro Abs 15.4 (*)    Lymphs Abs 4.2 (*)    Monocytes Absolute 1.3 (*)    Abs Immature Granulocytes 0.11 (*)    All other components within normal limits  LACTIC ACID, PLASMA - Abnormal; Notable for the following components:   Lactic Acid, Venous 2.2 (*)    All other components within normal limits  RESP PANEL BY RT-PCR (FLU A&B, COVID) [id number]  CULTURE, BLOOD (ROUTINE X 2)  CULTURE, BLOOD (ROUTINE X 2)  LIPASE, BLOOD  BRAIN NATRIURETIC PEPTIDE  LACTIC ACID, PLASMA  HEMOGLOBIN A1C  TROPONIN I (HIGH SENSITIVITY)  TROPONIN I (  HIGH SENSITIVITY)     EKG Sinus tachycardia, rate of 132, narrow complex, 1 mm ST segment elevations in V1 through V4, consistent with previous EKG on 07/03/2021,    RADIOLOGY  ED Provider Interpretation: I personally reviewed and interpreted this image.  Possible infiltrate in the left lower lung field.  CT CHEST WO CONTRAST  Result Date: 09/05/2021 CLINICAL DATA:  Shortness of breath, cough. EXAM: CT CHEST WITHOUT CONTRAST TECHNIQUE: Multidetector CT imaging of the chest was performed following the standard protocol without IV contrast. RADIATION DOSE REDUCTION: This exam was performed according to the departmental dose-optimization program which includes automated exposure control, adjustment of the mA and/or kV according to patient size and/or use of iterative reconstruction technique. COMPARISON:  Dec 07, 2020. FINDINGS: Cardiovascular: Atherosclerosis of thoracic aorta is noted without aneurysm formation. Normal  cardiac size. No pericardial effusion. Mediastinum/Nodes: No enlarged mediastinal or axillary lymph nodes. Thyroid gland, trachea, and esophagus demonstrate no significant findings. Lungs/Pleura: No pneumothorax or pleural effusion is noted. Stable right apical scarring is noted. Interval development of patchy airspace opacities in the right lower and middle lobes concerning for multifocal pneumonia. Upper Abdomen: No acute abnormality. Musculoskeletal: No chest wall mass or suspicious bone lesions identified. IMPRESSION: Multiple patchy airspace opacities are noted in the right lower and middle lobes concerning for multifocal pneumonia. Aortic Atherosclerosis (ICD10-I70.0). Electronically Signed   By: Marijo Conception M.D.   On: 09/05/2021 13:51   DG Chest Portable 1 View  Result Date: 09/05/2021 CLINICAL DATA:  Cough, shortness of breath EXAM: PORTABLE CHEST 1 VIEW COMPARISON:  Previous studies including the examination of 08/21/2021 FINDINGS: Cardiac size is within normal limits. There are no signs of alveolar pulmonary edema or definite focal pulmonary consolidation. There is asymmetric increased density in the left lower lung fields which may be related to right mastectomy. Possibility of early infiltrate in the left lower lung fields is not excluded. There is no pleural effusion or pneumothorax. IMPRESSION: Asymmetric haziness in the left lower lung fields may be related to right mastectomy. Possibility of early infiltrate in the left lower lung fields is not excluded. If clinically warranted, short-term follow-up PA and lateral views of chest may be considered. Electronically Signed   By: Elmer Picker M.D.   On: 09/05/2021 12:12    PROCEDURES:  Critical Care performed: None.  Procedures    MEDICATIONS ORDERED IN ED: Medications  lactated ringers infusion ( Intravenous New Bag/Given 09/05/21 1321)  vancomycin (VANCOREADY) IVPB 1500 mg/300 mL (1,500 mg Intravenous New Bag/Given 09/05/21 1425)   diclofenac (VOLTAREN) EC tablet 50 mg (has no administration in time range)  atorvastatin (LIPITOR) tablet 20 mg (has no administration in time range)  furosemide (LASIX) tablet 40 mg (has no administration in time range)  metoprolol succinate (TOPROL-XL) 24 hr tablet 12.5 mg (has no administration in time range)  venlafaxine XR (EFFEXOR-XR) 24 hr capsule 150 mg (has no administration in time range)  folic acid (FOLVITE) tablet 1 mg (has no administration in time range)  cyclobenzaprine (FLEXERIL) tablet 5 mg (has no administration in time range)  gabapentin (NEURONTIN) capsule 800 mg (has no administration in time range)  rOPINIRole (REQUIP) tablet 0.25 mg (has no administration in time range)  cholecalciferol (VITAMIN D3) tablet 1,000 Units (has no administration in time range)  montelukast (SINGULAIR) tablet 10 mg (has no administration in time range)  fluticasone furoate-vilanterol (BREO ELLIPTA) 100-25 MCG/ACT 1 puff (has no administration in time range)    And  umeclidinium bromide (INCRUSE  ELLIPTA) 62.5 MCG/ACT 1 puff (has no administration in time range)  enoxaparin (LOVENOX) injection 40 mg (has no administration in time range)  acetaminophen (TYLENOL) tablet 650 mg (has no administration in time range)    Or  acetaminophen (TYLENOL) suppository 650 mg (has no administration in time range)  ondansetron (ZOFRAN) tablet 4 mg (has no administration in time range)    Or  ondansetron (ZOFRAN) injection 4 mg (has no administration in time range)  azithromycin (ZITHROMAX) 500 mg in sodium chloride 0.9 % 250 mL IVPB (has no administration in time range)  cefTRIAXone (ROCEPHIN) 2 g in sodium chloride 0.9 % 100 mL IVPB (has no administration in time range)  insulin aspart (novoLOG) injection 0-15 Units (has no administration in time range)  guaiFENesin (MUCINEX) 12 hr tablet 1,200 mg (has no administration in time range)  levalbuterol (XOPENEX) nebulizer solution 0.63 mg (0.63 mg  Nebulization Given 09/05/21 1213)  potassium chloride SA (KLOR-CON M) CR tablet 40 mEq (40 mEq Oral Given 09/05/21 1231)  sodium chloride 0.9 % bolus 1,000 mL (0 mLs Intravenous Stopped 09/05/21 1513)  ceFEPIme (MAXIPIME) 2 g in sodium chloride 0.9 % 100 mL IVPB (0 g Intravenous Stopped 09/05/21 1358)     IMPRESSION / MDM / ASSESSMENT AND PLAN / ED COURSE  I reviewed the triage vital signs and the nursing notes.                              Jordan Chan is a 71 y.o. female, history of COPD, CHF, GERD, diabetes, hypertension, presents to the emergency department for evaluation of respiratory distress.  Patient states that she has been experiencing flulike symptoms for the past 3 days.  Endorses cough, congestion, and shortness of breath.   Differential diagnosis includes, but is not limited to, COPD exacerbation, heart failure exacerbation, influenza/COVID-19, ACS, bronchitis.  ED Course Patient appears stable, though has notable wheezing on exam.  Will initiate levalbuterol.  CBC shows leukocytosis 21.3 with elevated neutrophils at 15.4.  CMP notable for hyponatremia 132, now corrected sodium is 135 given hyperglycemia at 287.  Mild hypocalcemia at 8.5.  Hypokalemia at 3.2.  Will provide supplementation.  Lipase unremarkable at 32.  BNP unremarkable at 29.7.  Chest x-ray shows possible early left lower lobe infiltrate.  Respiratory panel negative for COVID-19 or influenza.  Pending blood cultures, lactic acid.  Assessment/Plan Given the patient's tachycardia, leukocytosis, and infection source in the left lower lobe, we will go ahead and call code sepsis. Strong suspicion for pneumonia.  Given the patient was recently treated with antibiotics for pneumonia last month, will treat as hospital-acquired pneumonia.  Initiating IV cefepime and vancomycin.  Spoke with hospitalist and agreed to admission.       FINAL CLINICAL IMPRESSION(S) / ED DIAGNOSES   Final diagnoses:  None     Rx  / DC Orders   ED Discharge Orders     None        Note:  This document was prepared using Dragon voice recognition software and may include unintentional dictation errors.   Teodoro Spray, [place] 09/05/21 1616    Delman Kitten, MD 09/09/21 1630

## 2021-09-05 NOTE — ED Provider Notes (Signed)
Medical screening examination/treatment/procedure(s) were conducted as a shared visit with non-physician practitioner(s) and myself.  I personally evaluated the patient during the encounter.    Delman Kitten, MD 09/05/21 1134

## 2021-09-05 NOTE — Telephone Encounter (Signed)
°  Chief Complaint: coughing a lot, return of pneumonia Symptoms: very productive frequent cough, especially at night, short of breath Frequency: constant for last 3 days Pertinent Negatives: Patient denies wheezing Disposition: [] ED /[x] Urgent Care (no appt availability in office) / [] Appointment(In office/virtual)/ []  [facility]/ [] Home Care/ [] Refused Recommended Disposition /[] [place] [facility]/ []  Follow-up with PCP Additional Notes: She at first refused ED and urgent care.    I called into the office first but there were no appts with any of the providers.   She really wanted to see [name], NP again.   I let her know after talking with Kristin Bruins that there were no appts and she needed to at least go to the urgent care since she is refusing the ED.   She kept asking for Jolene to call over and let her go get a chest x ray rather than go to the urgent care because she feels so bad.   I called back into the office and spoke with Iris.   This is Jolene's short day so pt really needed to be seen not just an x ray done.   I was finally able to get pt to agree to go to the urgent care by [employer] [facility] because she did not have to register on line to be seen like at the urgent care closest to her.   She's going to drive herself because she doesn't have anyone to drive her.   Her husband is out of town.   I let her know there was the option of calling 911 but she is refusing the ED.

## 2021-09-05 NOTE — Telephone Encounter (Signed)
Noted  

## 2021-09-05 NOTE — ED Triage Notes (Signed)
Pt to ED via [facility] from home for cough and shortness of breath. Pt states that her symptoms started 3-4 days ago but got worse last night. Pt states that this morning she had 3 episodes of diarrhea and 2 episodes of vomiting this morning. Pt states that she has a hard time catching her breath when she starts coughing. Pt was given Solumedrol 125 mg in route as well as 2 duoneb treatments. Pt having some difficulty speaking in complete sentences. Pt states that she was treated for pneumonia in December.

## 2021-09-05 NOTE — Telephone Encounter (Signed)
Reason for Disposition  Patient sounds very sick or weak to the triager  Answer Assessment - Initial Assessment Questions 1. RESPIRATORY STATUS: "Describe your breathing?" (e.g., wheezing, shortness of breath, unable to speak, severe coughing)      Pt having difficulty breathing.    I had pneumonia Dec. 28.    Saw Jolene.   I'm coughing so much now especially at night.   It makes me want to vomit.    I having breathing difficulty.    I think the pneumonia is back.  Pulse ox 97% after I take off the artificial nail.   Uses O2 at night.   Does not have it on for this pulse ox reading. 2. ONSET: "When did this breathing problem begin?"      3rd day this is going on.     3. PATTERN "Does the difficult breathing come and go, or has it been constant since it started?"      Constant for the last 3 days 4. SEVERITY: "How bad is your breathing?" (e.g., mild, moderate, severe)    - MILD: No SOB at rest, mild SOB with walking, speaks normally in sentences, can lie down, no retractions, pulse < 100.    - MODERATE: SOB at rest, SOB with minimal exertion and prefers to sit, cannot lie down flat, speaks in phrases, mild retractions, audible wheezing, pulse 100-120.    - SEVERE: Very SOB at rest, speaks in single words, struggling to breathe, sitting hunched forward, retractions, pulse > 120      Severe 5. RECURRENT SYMPTOM: "Have you had difficulty breathing before?" If Yes, ask: "When was the last time?" and "What happened that time?"      Yes 6. CARDIAC HISTORY: "Do you have any history of heart disease?" (e.g., heart attack, angina, bypass surgery, angioplasty)      N/A 7. LUNG HISTORY: "Do you have any history of lung disease?"  (e.g., pulmonary embolus, asthma, emphysema)     Chronic bronchitis and history of pneumonia many times 8. CAUSE: "What do you think is causing the breathing problem?"      Pneumonia is back 9. OTHER SYMPTOMS: "Do you have any other symptoms? (e.g., dizziness, runny nose,  cough, chest pain, fever)     Coughing up a lot of mucus 10. O2 SATURATION MONITOR:  "Do you use an oxygen saturation monitor (pulse oximeter) at home?" If Yes, "What is your reading (oxygen level) today?" "What is your usual oxygen saturation reading?" (e.g., 95%)       97% now on room air     11. PREGNANCY: "Is there any chance you are pregnant?" "When was your last menstrual period?"       N/A 12. TRAVEL: "Have you traveled out of the country in the last month?" (e.g., travel history, exposures)       N/A  Protocols used: Breathing Difficulty-A-AH

## 2021-09-05 NOTE — ED Notes (Signed)
Lab in room.

## 2021-09-05 NOTE — ED Notes (Signed)
2nd lactic not collected yet as IVF bolus not complete at this time.

## 2021-09-05 NOTE — Progress Notes (Signed)
PHARMACY -  BRIEF ANTIBIOTIC NOTE   Pharmacy has received consult(s) for vancomycin and cefepime from an ED provider.  The patient's profile has been reviewed for ht/wt/allergies/indication/available labs.    One time order(s) placed for: Cefepime 2 g Vancomycin 1500 mg   Further antibiotics/pharmacy consults should be ordered by admitting physician if indicated.                       Thank you, Forde Dandy Tamryn Popko 09/05/2021  12:56 PM

## 2021-09-05 NOTE — H&P (Addendum)
History and Physical    Patient: Jordan Chan QPY:[id number] DOB: 20-Apr-1951 DOA: 09/05/2021 DOS: the patient was seen and examined on 09/05/2021 PCP: [name], NP  Patient coming from: Home  Chief Complaint:  Chief Complaint  Patient presents with   Shortness of Breath   Cough    HPI: Jordan Chan is a 71 y.o. female with medical history significant for diabetes mellitus, history of breast cancer status post mastectomy and chemotherapy, history of COPD with chronic respiratory failure on 2 L of oxygen at night, chronic back pain, dyslipidemia who presents to the ER via EMS for evaluation of a 3-day history of a cough productive of yellowish green phlegm associated with worsening shortness of breath from her baseline.  She has not had a fever and denies having any chills. She also has had nausea and emesis as well as diarrhea. EMS administered Solu-Medrol 125 mg IV as well as 2 DuoNeb treatments prior to her arrival to the ER. Patient states that she was treated for pneumonia about a month ago. She denies having any chest pain, no headache, no blurred vision, no dizziness, no palpitations, no diaphoresis, no urinary symptoms, no focal deficits.   Review of Systems: As mentioned in the history of present illness. All other systems reviewed and are negative. Past Medical History:  Diagnosis Date   Arthritis    Asthma    Breast cancer ([place]) 1998   right breast ca with mastectomy and chemotherapy and radiation   Bronchitis    CHF (congestive heart failure) ([place])    "with Morphine withdrawal"   COPD (chronic obstructive pulmonary disease) ([place])    Diabetes mellitus without complication ([place])    Diverticulitis    diverticulosis also   Dyspnea    Endometriosis    GERD (gastroesophageal reflux disease)    History of shingles 2000-2005   Hypercholesteremia    Hypertension    IBS (irritable bowel syndrome)    Low back pain    a. Implanted  morphine/bupivicaine/clonidine pump.   Neuropathy    Orthopnea    Oxygen dependent    uses at night   Personal history of chemotherapy    Personal history of radiation therapy    Pneumonia    pneumonia 5-6 times, history of bronchitis also   Scoliosis    Sleep apnea    does not use cpap   Stroke ([place]) 2010   TIA, 10 years ago   Withdrawal from sedative drug ([place])    withdrawal from morphine when pump batteries died   Past Surgical History:  Procedure Laterality Date   ABDOMINAL HYSTERECTOMY  1987   BACK SURGERY     Tailbone removed following fracture   BREAST SURGERY Right    mastectomy   CARDIAC CATHETERIZATION     CATARACT EXTRACTION W/PHACO Right 05/19/2019   Procedure: CATARACT EXTRACTION PHACO AND INTRAOCULAR LENS PLACEMENT ([place]), RIGHT, DIABETIC;  Surgeon: Marchia Meiers, MD;  Location: [facility] ORS;  Service: Ophthalmology;  Laterality: Right;  Lot # [id number] H [place]: 00:43.8 CDE: 4.59   CATARACT EXTRACTION W/PHACO Left 06/16/2019   Procedure: CATARACT EXTRACTION PHACO AND INTRAOCULAR LENS PLACEMENT ([place]) LEFT VISION BLUE DIABETIC;  Surgeon: Marchia Meiers, MD;  Location: [facility] ORS;  Service: Ophthalmology;  Laterality: Left;  Lot #[id number] H [place]: 00:46.9 CDE: 6.53   COCCYX REMOVAL     COLONOSCOPY WITH PROPOFOL N/A 01/01/2017   Procedure: COLONOSCOPY WITH PROPOFOL;  Surgeon: Jonathon Bellows, MD;  Location: [facility] ENDOSCOPY;  Service: Endoscopy;  Laterality: N/A;  ELBOW ARTHROSCOPY WITH TENDON RECONSTRUCTION     ESOPHAGOGASTRODUODENOSCOPY (EGD) WITH PROPOFOL N/A 01/01/2017   Procedure: ESOPHAGOGASTRODUODENOSCOPY (EGD) WITH PROPOFOL;  Surgeon: Jonathon Bellows, MD;  Location: [facility] ENDOSCOPY;  Service: Endoscopy;  Laterality: N/A;   INTRATHECAL PUMP IMPLANT     LEFT HEART CATH AND CORONARY ANGIOGRAPHY N/A 04/28/2017   Procedure: LEFT HEART CATH AND CORONARY ANGIOGRAPHY;  Surgeon: [name], MD;  Location: [place] CV LAB;  Service: Cardiovascular;  Laterality: N/A;   MASTECTOMY Right  06/1997   morphine pump  2011   PLANTAR FASCIA RELEASE     TRIGGER FINGER RELEASE     Social History:  reports that she quit smoking about 17 years ago. Her smoking use included cigarettes. She has a 30.00 pack-year smoking history. She has never used smokeless tobacco. She reports that she does not drink alcohol and does not use drugs.  Allergies  Allergen Reactions   Other Palpitations    IV steroids   Pain Patch [Menthol] Anaphylaxis   Fentanyl    Avelox [Moxifloxacin Hcl In Nacl] Other (See Comments)    Upset stomach   Doxycycline Diarrhea   Erythromycin Nausea Only and Other (See Comments)    Can take a Z-Pak just fine Other reaction(s): Other (See Comments) Can take Z-Pak  Can take a Z-Pak just fine   Moxifloxacin Hcl Other (See Comments)    Upset stomach Upset stomach   Oxycontin [Oxycodone] Hives   Ozempic [Semaglutide] Nausea Only    Family History  Problem Relation Age of Onset   Cancer Mother 21       lung   Thyroid disease Mother    Cancer Father 53       Pancreatic   Hypertension Sister    Cancer Sister        "cancer on face"   Hyperlipidemia Sister    Osteoporosis Maternal Grandmother    Hyperlipidemia Son    Seizures Son    Cancer Paternal Grandmother        colon   Arthritis Paternal Grandfather    Breast cancer Neg Hx     Prior to Admission medications   Medication Sig Start Date End Date Taking? Authorizing Provider  ACCU-CHEK AVIVA PLUS test strip TEST THREE TIMES DAILY 05/11/21   [name], Henrine Screws T, NP  Accu-Chek Softclix Lancets lancets TEST BLOOD SUGAR THREE TIMES DAILY 01/30/21   [name], Henrine Screws T, NP  albuterol (PROVENTIL HFA;VENTOLIN HFA) 108 (90 BASE) MCG/ACT inhaler Inhale 2 puffs into the lungs every 6 (six) hours as needed for wheezing or shortness of breath.     [provider]  Alcohol Swabs (B-D SINGLE USE SWABS REGULAR) PADS USE TWICE DAILY  WITH  SUGAR  CHECKS 01/16/21   [name], Jolene T, NP  atorvastatin (LIPITOR) 20 MG  tablet Take 1 tablet (20 mg total) by mouth daily. 04/29/21   [name], Henrine Screws T, NP  Blood Glucose Monitoring Suppl (TRUE METRIX METER) w/Device KIT Use to check blood sugar 4 times a day 05/14/21   [name], Henrine Screws T, NP  cholecalciferol (VITAMIN D3) 25 MCG (1000 UNIT) tablet Take 1,000 Units by mouth daily.    [provider]  Cyanocobalamin 1000 MCG/ML KIT Inject 1,000 mcg as directed every 30 (thirty) days.    [provider]  cyclobenzaprine (FLEXERIL) 5 MG tablet Take 5 mg by mouth 2 (two) times daily as needed for muscle spasms. Takes very rarely    [provider]  diclofenac (VOLTAREN) 50 MG EC tablet TAKE 1  TABLET THREE TIMES DAILY 08/30/21   [name], Henrine Screws T, NP  Dulaglutide (TRULICITY) 4.5 RS/8.5IO SOPN Inject 4.5 mg as directed once a week. 07/20/21   [name] T, NP  folic acid (FOLVITE) 1 MG tablet TAKE 1 TABLET EVERY DAY 02/08/21   Sindy Guadeloupe, MD  furosemide (LASIX) 40 MG tablet TAKE 1 TABLET EVERY DAY 07/15/21   End, Harrell Gave, MD  gabapentin (NEURONTIN) 800 MG tablet Take 1 tablet (800 mg total) by mouth 3 (three) times daily. 10/25/20   [name], Henrine Screws T, NP  lactulose (CHRONULAC) 10 GM/15ML solution Take 45 mLs (30 g total) by mouth daily as needed for mild constipation or severe constipation. 12/20/19   Loletha Grayer, MD  metFORMIN (GLUCOPHAGE) 1000 MG tablet Take 1 tablet (1,000 mg total) by mouth 2 (two) times daily with a meal. 10/25/20   [name], Henrine Screws T, NP  metoprolol succinate (TOPROL-XL) 25 MG 24 hr tablet TAKE 1/2 TABLET EVERY DAY 05/13/21   Dunn, Areta Haber, PA-C  montelukast (SINGULAIR) 10 MG tablet Take 10 mg by mouth at bedtime.    [provider]  PAIN MANAGEMENT INTRATHECAL, IT, PUMP 1 each by Intrathecal route. Intrathecal (IT) medication:  Morphine Patient does not remember current. Adjusted every 2.5 months at [facility] in [place].    [provider]  rOPINIRole (REQUIP) 0.25 MG tablet Take 1  tablet (0.25 mg total) by mouth at bedtime. 08/14/21   [name], Jolene T, NP  TRELEGY ELLIPTA 100-62.5-25 MCG/INH AEPB INHALE 1 PUFF INTO THE LUNGS DAILY. 12/03/20   [name], Jolene T, NP  TRESIBA FLEXTOUCH 100 UNIT/ML FlexTouch Pen INJECT 30 UNITS INTO THE SKIN AT BEDTIME 05/17/21   [name], Jolene T, NP  venlafaxine XR (EFFEXOR-XR) 150 MG 24 hr capsule Take 1 capsule (150 mg total) by mouth daily. 10/22/20   [name], NP    Physical Exam: Vitals:   09/05/21 1130 09/05/21 1145 09/05/21 1215 09/05/21 1315  BP: (!) 118/52 (!) 103/57  (!) 103/50  Pulse: (!) 150 (!) 130 (!) 126 (!) 119  Resp: (!) 22 (!) 21 17 (!) 23  Temp:      TempSrc:      SpO2: 98% 100% 99% 100%  Weight:      Height:       General: Awake, uncomfortable.  Able to speak in full sentences CV: Good peripheral perfusion.  Resp: Tachypneic.scattered expiratory wheezes and rhonchi bilaterally  Abd: Soft, non-tender. No distention.  Neuro: At baseline. No gross neurological deficits. Other: No chest wall tenderness.   Data Reviewed: Labs reviewed and patient has marked leukocytosis with a left shift, elevated lactic acid level at 2.2 as well as hypokalemia at 3.2 Respiratory viral panel is negative Chest x-ray reviewed by me shows asymmetric haziness in the left lower lung field Chest CT reviewed and shows multiple patchy airspace opacities in the right lower and middle lobes concerning for multifocal pneumonia Awaiting results of repeat lactic acid Results are pending, will review when available.  Assessment and Plan:  Principal Problem:   Sepsis due to pneumonia [facility]) Active Problems:   Chronic kidney disease, stage III (moderate) (HCC)   Insulin dependent type 2 diabetes mellitus (HCC)   Chronic HFrEF (heart failure with reduced ejection fraction) (HCC)   COPD with acute exacerbation (HCC)   Sepsis due to pneumonia (POA) As evidenced by tachycardia, tachypnea, mild leukocytosis, lactic acidosis and  imaging showing multifocal pneumonia. Continue IV fluid resuscitation Place patient empirically on IV Rocephin and  Zithromax Trend lactic acid levels Follow-up results of blood cultures Speech therapy consult for swallow function evaluation.    COPD exacerbation Continue scheduled and as needed bronchodilator therapy Continue inhaled steroids Supportive care with antitussive Continue oxygen supplementation to maintain pulse oximetry greater than 92%.   Diabetes mellitus with stage III chronic kidney disease Maintain consistent carbohydrate diet Glycemic control with sliding scale insulin    Depression Continue venlafaxine   Chronic systolic heart failure Stable and not acutely exacerbated Last known LVEF from 05/22 is 45% Continue furosemide and metoprolol      Advance Care Planning:   Code Status: Prior Full code  Consults: None  Family Communication: Greater than 50% of time was spent discussing patient's condition and plan of care with her and her daughter at the bedside.  All questions and concerns have been addressed.  They verbalized understanding and agreed with the plan.  Severity of Illness: The appropriate patient status for this patient is INPATIENT. Inpatient status is judged to be reasonable and necessary in order to provide the required intensity of service to ensure the patient's safety. The patient's presenting symptoms, physical exam findings, and initial radiographic and laboratory data in the context of their chronic comorbidities is felt to place them at high risk for further clinical deterioration. Furthermore, it is not anticipated that the patient will be medically stable for discharge from the hospital within 2 midnights of admission.   * I certify that at the point of admission it is my clinical judgment that the patient will require inpatient hospital care spanning beyond 2 midnights from the point of admission due to high intensity of service,  high risk for further deterioration and high frequency of surveillance required.*  Author: Collier Bullock, MD 09/05/2021 1:54 PM  For on call review [email or web address].

## 2021-09-05 NOTE — ED Provider Notes (Signed)
ED Sepsis - Repeat Assessment   Last Vitals:    Blood pressure (!) 103/50, pulse (!) 119, temperature 99.3 F (37.4 C), temperature source Oral, resp. rate (!) 23, height 4\' 10"  (1.473 m), weight 68.5 kg, SpO2 100 %.  Heart:      Regular rate and rhythm  Lungs:     Diminished right base some crackles in the right base.  Mild expiratory wheezing throughout  Capillary Refill:   Normal   Delman Kitten, MD 09/05/21 1353

## 2021-09-05 NOTE — Telephone Encounter (Signed)
Left message with patient to follow up to see if patient went to the Urgent Care per Jolene. Advised patient to get our office a call back.

## 2021-09-05 NOTE — ED Notes (Signed)
EDP aware of VS 

## 2021-09-06 LAB — CBC
HCT: 33.7 % — ABNORMAL LOW (ref 36.0–46.0)
Hemoglobin: 10.7 g/dL — ABNORMAL LOW (ref 12.0–15.0)
MCH: 30.4 pg (ref 26.0–34.0)
MCHC: 31.8 g/dL (ref 30.0–36.0)
MCV: 95.7 fL (ref 80.0–100.0)
Platelets: 375 10*3/uL (ref 150–400)
RBC: 3.52 MIL/uL — ABNORMAL LOW (ref 3.87–5.11)
RDW: 13.9 % (ref 11.5–15.5)
WBC: 19.3 10*3/uL — ABNORMAL HIGH (ref 4.0–10.5)
nRBC: 0 % (ref 0.0–0.2)

## 2021-09-06 LAB — PROCALCITONIN: Procalcitonin: 0.2 ng/mL

## 2021-09-06 LAB — RESPIRATORY PANEL BY PCR

## 2021-09-06 LAB — BASIC METABOLIC PANEL
Anion gap: 9 (ref 5–15)
BUN: 17 mg/dL (ref 8–23)
CO2: 27 mmol/L (ref 22–32)
Calcium: 8.2 mg/dL — ABNORMAL LOW (ref 8.9–10.3)
Chloride: 100 mmol/L (ref 98–111)
Creatinine, Ser: 0.8 mg/dL (ref 0.44–1.00)
GFR, Estimated: >60 mL/min (ref >60)
Glucose, Bld: 198 mg/dL — ABNORMAL HIGH (ref 70–99)
Potassium: 4.2 mmol/L (ref 3.5–5.1)
Sodium: 136 mmol/L (ref 135–145)

## 2021-09-06 LAB — PROTIME-INR
INR: 1.2 (ref 0.8–1.2)
Prothrombin Time: 15.4 seconds — ABNORMAL HIGH (ref 11.4–15.2)

## 2021-09-06 LAB — GLUCOSE, CAPILLARY
Glucose-Capillary: 240 mg/dL — ABNORMAL HIGH (ref 70–99)
Glucose-Capillary: 138 mg/dL — ABNORMAL HIGH (ref 70–99)
Glucose-Capillary: 364 mg/dL — ABNORMAL HIGH (ref 70–99)
Glucose-Capillary: 325 mg/dL — ABNORMAL HIGH (ref 70–99)

## 2021-09-06 LAB — CORTISOL-AM, BLOOD: Cortisol - AM: 1.4 ug/dL — ABNORMAL LOW (ref 6.7–22.6)

## 2021-09-06 MED ORDER — LEVOFLOXACIN 500 MG PO TABS: 750.0000 mg | ORAL_TABLET | Freq: Every day | ORAL | Status: DC

## 2021-09-06 MED ORDER — IPRATROPIUM-ALBUTEROL 0.5-2.5 (3) MG/3ML IN SOLN
3.0000 mL | Freq: Four times a day (QID) | RESPIRATORY_TRACT | Status: DC
  Administered 2021-09-06 – 2021-09-08 (×8): 3 mL via RESPIRATORY_TRACT
  Filled 2021-09-06 (×9): qty 3

## 2021-09-06 MED ORDER — PREDNISONE 20 MG PO TABS
40.0000 mg | ORAL_TABLET | Freq: Every day | ORAL | Status: DC
  Administered 2021-09-06 – 2021-09-08 (×3): 40 mg via ORAL
  Filled 2021-09-06 (×3): qty 2

## 2021-09-06 MED ORDER — ALBUTEROL SULFATE (2.5 MG/3ML) 0.083% IN NEBU: 2.5000 mg | INHALATION_SOLUTION | RESPIRATORY_TRACT | Status: DC | PRN

## 2021-09-06 MED ORDER — INSULIN GLARGINE-YFGN 100 UNIT/ML ~~LOC~~ SOLN
25.0000 [IU] | Freq: Every day | SUBCUTANEOUS | Status: DC
  Administered 2021-09-06 – 2021-09-07 (×2): 25 [IU] via SUBCUTANEOUS
  Filled 2021-09-06 (×4): qty 0.25

## 2021-09-06 MED ORDER — INSULIN GLARGINE-YFGN 100 UNIT/ML ~~LOC~~ SOLN
5.0000 [IU] | Freq: Every day | SUBCUTANEOUS | Status: DC
  Filled 2021-09-06: qty 0.05

## 2021-09-06 MED ORDER — INSULIN ASPART 100 UNIT/ML IJ SOLN
0.0000 [IU] | Freq: Three times a day (TID) | INTRAMUSCULAR | Status: DC
  Administered 2021-09-06 – 2021-09-07 (×3): 11 [IU] via SUBCUTANEOUS
  Administered 2021-09-07: 3 [IU] via SUBCUTANEOUS
  Administered 2021-09-07: 5 [IU] via SUBCUTANEOUS
  Administered 2021-09-08: 3 [IU] via SUBCUTANEOUS
  Filled 2021-09-06 (×6): qty 1

## 2021-09-06 MED ORDER — LEVOFLOXACIN 750 MG PO TABS
750.0000 mg | ORAL_TABLET | Freq: Every day | ORAL | Status: DC
  Administered 2021-09-06 – 2021-09-07 (×2): 750 mg via ORAL
  Filled 2021-09-06 (×2): qty 1

## 2021-09-06 NOTE — Telephone Encounter (Signed)
Left message asking pt to call back to see if she wanted an earlier appt than 2/14 to follow up with [facility]

## 2021-09-06 NOTE — Evaluation (Signed)
Clinical/Bedside Swallow Evaluation Patient Details  Name: Jordan Chan MRN: [id number] Date of Birth: 11/25/1950  Today's Date: 09/06/2021 Time: SLP Start Time (ACUTE ONLY): 0910 SLP Stop Time (ACUTE ONLY): 0109 SLP Time Calculation (min) (ACUTE ONLY): 15 min  Past Medical History:  Past Medical History:  Diagnosis Date   Arthritis    Asthma    Breast cancer ([place]) 1998   right breast ca with mastectomy and chemotherapy and radiation   Bronchitis    CHF (congestive heart failure) ([place])    "with Morphine withdrawal"   COPD (chronic obstructive pulmonary disease) ([place])    Diabetes mellitus without complication ([place])    Diverticulitis    diverticulosis also   Dyspnea    Endometriosis    GERD (gastroesophageal reflux disease)    History of shingles 2000-2005   Hypercholesteremia    Hypertension    IBS (irritable bowel syndrome)    Low back pain    a. Implanted morphine/bupivicaine/clonidine pump.   Neuropathy    Orthopnea    Oxygen dependent    uses at night   Personal history of chemotherapy    Personal history of radiation therapy    Pneumonia    pneumonia 5-6 times, history of bronchitis also   Scoliosis    Sleep apnea    does not use cpap   Stroke ([place]) 2010   TIA, 10 years ago   Withdrawal from sedative drug ([place])    withdrawal from morphine when pump batteries died   Past Surgical History:  Past Surgical History:  Procedure Laterality Date   ABDOMINAL HYSTERECTOMY  1987   BACK SURGERY     Tailbone removed following fracture   BREAST SURGERY Right    mastectomy   CARDIAC CATHETERIZATION     CATARACT EXTRACTION W/PHACO Right 05/19/2019   Procedure: CATARACT EXTRACTION PHACO AND INTRAOCULAR LENS PLACEMENT ([place]), RIGHT, DIABETIC;  Surgeon: Marchia Meiers, MD;  Location: [facility] ORS;  Service: Ophthalmology;  Laterality: Right;  Lot # [id number] H [place]: 00:43.8 CDE: 4.59   CATARACT EXTRACTION W/PHACO Left 06/16/2019   Procedure: CATARACT EXTRACTION PHACO AND  INTRAOCULAR LENS PLACEMENT ([place]) LEFT VISION BLUE DIABETIC;  Surgeon: Marchia Meiers, MD;  Location: [facility] ORS;  Service: Ophthalmology;  Laterality: Left;  Lot #[id number] H [place]: 00:46.9 CDE: 6.53   COCCYX REMOVAL     COLONOSCOPY WITH PROPOFOL N/A 01/01/2017   Procedure: COLONOSCOPY WITH PROPOFOL;  Surgeon: Jonathon Bellows, MD;  [facility] ENDOSCOPY;  Service: Endoscopy;  Laterality: N/A;   ELBOW ARTHROSCOPY WITH TENDON RECONSTRUCTION     ESOPHAGOGASTRODUODENOSCOPY (EGD) WITH PROPOFOL N/A 01/01/2017   Procedure: ESOPHAGOGASTRODUODENOSCOPY (EGD) WITH PROPOFOL;  Surgeon: Jonathon Bellows, MD;  Location: [facility] ENDOSCOPY;  Service: Endoscopy;  Laterality: N/A;   INTRATHECAL PUMP IMPLANT     LEFT HEART CATH AND CORONARY ANGIOGRAPHY N/A 04/28/2017   Procedure: LEFT HEART CATH AND CORONARY ANGIOGRAPHY;  Surgeon: [name], MD;  Location: [place] CV LAB;  Service: Cardiovascular;  Laterality: N/A;   MASTECTOMY Right 06/1997   morphine pump  2011   PLANTAR FASCIA RELEASE     TRIGGER FINGER RELEASE     HPI:  27 YOF p/w SOB/cough and d/v x2 prior to arrival. PMH of COPD, DM, BrCA s/p chemo, CHF, GERD, HTN, O2 dependent at night, and recurrent history of pna (most recently 1 month ago primarily in the left lobe)   Clinical Indicators Chest imaging: CT Multiple patchy airspace opacities are noted in the right lower and middle lobes concerning for multifocal pneumonia. WBC: 19.3  Temperature: 98.7 Respiratory Status: Room air Dysphagia History: none   Assessment / Plan / Recommendation  Clinical Impression  Patient presents with functional oropharyngeal swallow as demonstrated by no overt s/s aspiration across multiple thin liquids, puree and solid trials. x2 dry cough between trials though c/w baseline cough, low suspicion for relation to intake given delayed timing and dry nature without persistence given continued trials. Patient demonstrated functional oral phase with adequate containment, timely  preparation and complete oral clearance. Mastication WFL despite absent lower dentition. Cranial nerve exam unremarkable with exception of hoarse vocal quality though iso persistent cough and history of smoking. Patient denied difficulty or changes to swallowing in addition to demonstration without s/s.  RN in room also denied coughing or difficulty when taking meds or eating/drinking when she was in room. At baseline patient is mobile and independent, lives with husband.  No deficits or overt s/s demonstrated to warrant diet modification or further instrumental work up at this time. Given history of recurrent pna recommend continued staff monitor of tolerance, please reconsult if concerns arise or status changes.   SLP educated patient and family re: elevated risk of aspiration/development of dysphagia iso COPD therefore edu re: s/s aspiration/dysphagia and SLP services available in house if s/s occur as well as SLP services as outpatient with PCP referral if s/s or c/f dysphagia develops.  SLP Visit Diagnosis: Dysphagia, oropharyngeal phase (R13.12)    Aspiration Risk  Mild aspiration risk    Diet Recommendation    Regular solids (cognitively able to self-select preferred dental soft solids) Thin Liquids  Medication Administration: Whole meds with liquid     Recommendations for follow up therapy are one component of a multi-disciplinary discharge planning process, led by the attending physician.  Recommendations may be updated based on patient status, additional functional criteria and insurance authorization.  Follow up Recommendations  (do not anticipate SLP follow up s/p discharge)      Assistance Recommended at Discharge None  Functional Status Assessment Patient has not had a recent decline in their functional status   Prognosis Prognosis for Safe Diet Advancement: Good      Swallow Study   General HPI: 56 YOF p/w SOB/cough and d/v x2 prior to arrival. PMH of COPD, DM, BrCA s/p  chemo, CHF, GERD, HTN, O2 dependent at night, and recurrent history of pna (most recently 1 month ago primarily in the left lobe) Type of Study: Bedside Swallow Evaluation Previous Swallow Assessment: None Diet Prior to this Study: Regular Temperature Spikes Noted: No Respiratory Status: Room air History of Recent Intubation: No Behavior/Cognition: Alert;Cooperative;Pleasant mood Oral Cavity Assessment: Within Functional Limits Oral Care Completed by SLP: Recent completion by staff Oral Cavity - Dentition: Dentures, top Vision: Functional for self-feeding Self-Feeding Abilities: Able to feed self Patient Positioning: Upright in bed Baseline Vocal Quality: Hoarse (smoking history) Volitional Cough: Strong Volitional Swallow: Able to elicit    Oral/Motor/Sensory Function Overall Oral Motor/Sensory Function: Within functional limits   Thin Liquid Thin Liquid: Within functional limits Presentation: Straw Other Comments: 4oz single and sequential    Nectar Thick Nectar Thick Liquid: Not tested   Honey Thick Honey Thick Liquid: Not tested   Puree Puree: Within functional limits Presentation: Self Fed;Spoon Other Comments: 2oz   Solid     Solid: Within functional limits Presentation: Self Fed Other Comments: x4 cracker bites     Ethelene Hal, M.A. CCC-SLP   [place] L Dejanique Ruehl 09/06/2021,9:51 AM

## 2021-09-06 NOTE — TOC CM/SW Note (Signed)
°  Transition of Care [facility]) Screening Note   Patient Details  Name: PRESLEIGH FELDSTEIN Date of Birth: June 18, 1951   Transition of Care [facility]) CM/SW Contact:    Candie Chroman, LCSW Phone Number: 09/06/2021, 3:10 PM    Transition of Care Department [facility]) has reviewed patient and no TOC needs have been identified at this time. We will continue to monitor patient advancement through interdisciplinary progression rounds. If new patient transition needs arise, please place a TOC consult.

## 2021-09-06 NOTE — Progress Notes (Addendum)
PROGRESS NOTE    Jordan Chan  HQP:[id number] DOB: July 30, 1951 DOA: 09/05/2021 PCP: [name], NP  Outpatient Specialists: cardiology, pain medicine, oncology    Brief Narrative:  From admission h and p Jordan Chan is a 71 y.o. female with medical history significant for diabetes mellitus, history of breast cancer status post mastectomy and chemotherapy, history of COPD with chronic respiratory failure on 2 L of oxygen at night, chronic back pain, dyslipidemia who presents to the ER via EMS for evaluation of a 3-day history of a cough productive of yellowish green phlegm associated with worsening shortness of breath from her baseline.  She has not had a fever and denies having any chills. She also has had nausea and emesis as well as diarrhea. EMS administered Solu-Medrol 125 mg IV as well as 2 DuoNeb treatments prior to her arrival to the ER. Patient states that she was treated for pneumonia about a month ago. She denies having any chest pain, no headache, no blurred vision, no dizziness, no palpitations, no diaphoresis, no urinary symptoms, no focal deficits.     Assessment & Plan:   Principal Problem:   Sepsis due to pneumonia [facility]) Active Problems:   Chronic kidney disease, stage III (moderate) (HCC)   Insulin dependent type 2 diabetes mellitus (HCC)   Chronic HFrEF (heart failure with reduced ejection fraction) ([place])   COPD with acute exacerbation ([place])  # COPD with acute exacerbation # Community acquired pneumonia Treated for CAP in December w/ augmentin and azithromycin, was flu positive then. Reports she improved but never fully recovered, now several days of worsening productive cough and shortness of breath - levofloxacin, prednisone - f/u urine antigens, blood culture, sputum culture - duoneb q6, albuterol q2 prn - home inhalers  # Acute hypoxic respiratory failure On 2 L [place] at night at home. Here o2 upper 80s on room air, resolved w/ 2 L. O2 wnl  this morning off o2 - monitor   # T2DM Here glucose mildly eleavted - SSI - semglee 25  # MDD - home venlafaxine  # HFrEF Hx stress-induced cardiomyopathy. Most recent EF 45% - cont home lasix, metop  # Chronic pain Has intrathecal pump - home gabapentin, diclofenac     DVT prophylaxis: lovenox Code Status: full Family Communication: none @ bedside  Level of care: Progressive Status is: Inpatient Remains inpatient appropriate because: severity of illness            Consultants:  none  Procedures: none  Antimicrobials:  Cefepime/azith>levo    Subjective: This morning feels slightly better. Still w/ cough and sob. No chest pain. Tolerating diet.   Objective: Vitals:   09/05/21 2040 09/06/21 0028 09/06/21 0531 09/06/21 0908  BP: 103/60 119/63 114/71 (!) 123/59  Pulse: 90 81 85 85  Resp: 17 18 20 14   Temp: 98.1 F (36.7 C) 97.9 F (36.6 C) 97.7 F (36.5 C) 98.7 F (37.1 C)  TempSrc:   Oral   SpO2: 96% 100% 100% 98%  Weight:   67.5 kg   Height:        Intake/Output Summary (Last 24 hours) at 09/06/2021 0916 Last data filed at 09/06/2021 0152 Gross per 24 hour  Intake 746.76 ml  Output --  Net 746.76 ml   Filed Weights   09/05/21 1112 09/06/21 0531  Weight: 68.5 kg 67.5 kg    Examination:  General exam: Appears calm and comfortable  Respiratory system: rhonchi throughout, faint exp wheeze Cardiovascular system: S1 & S2  heard, RRR. No JVD, murmurs, rubs, gallops or clicks. No pedal edema. Gastrointestinal system: Abdomen is nondistended, soft and nontender. No organomegaly or masses felt. Normal bowel sounds heard. Central nervous system: Alert and oriented. No focal neurological deficits. Extremities: Symmetric 5 x 5 power. Skin: No rashes, lesions or ulcers Psychiatry: Judgement and insight appear normal. Mood & affect appropriate.     Data Reviewed: I have personally reviewed following labs and imaging studies  CBC: Recent Labs   Lab 09/05/21 1114 09/06/21 0638  WBC 21.3* 19.3*  NEUTROABS 15.4*  --   HGB 12.6 10.7*  HCT 39.1 33.7*  MCV 95.4 95.7  PLT 428* 637   Basic Metabolic Panel: Recent Labs  Lab 09/05/21 1114 09/06/21 0638  NA 132* 136  K 3.2* 4.2  CL 91* 100  CO2 29 27  GLUCOSE 287* 198*  BUN 14 17  CREATININE 0.89 0.80  CALCIUM 8.5* 8.2*   GFR: Estimated Creatinine Clearance: 52.4 mL/min (by C-G formula based on SCr of 0.8 mg/dL). Liver Function Tests: Recent Labs  Lab 09/05/21 1114  AST 19  ALT 12  ALKPHOS 58  BILITOT 0.5  PROT 8.2*  ALBUMIN 3.7   Recent Labs  Lab 09/05/21 1114  LIPASE 32   No results for input(s): AMMONIA in the last 168 hours. Coagulation Profile: Recent Labs  Lab 09/06/21 0638  INR 1.2   Cardiac Enzymes: No results for input(s): CKTOTAL, CKMB, CKMBINDEX, TROPONINI in the last 168 hours. BNP (last 3 results) No results for input(s): PROBNP in the last 8760 hours. HbA1C: Recent Labs    09/05/21 1455  HGBA1C 8.0*   CBG: Recent Labs  Lab 09/05/21 1630 09/05/21 2135 09/06/21 0906  GLUCAP 332* 268* 240*   Lipid Profile: No results for input(s): CHOL, HDL, LDLCALC, TRIG, CHOLHDL, LDLDIRECT in the last 72 hours. Thyroid Function Tests: No results for input(s): TSH, T4TOTAL, FREET4, T3FREE, THYROIDAB in the last 72 hours. Anemia Panel: No results for input(s): VITAMINB12, FOLATE, FERRITIN, TIBC, IRON, RETICCTPCT in the last 72 hours. Urine analysis:    Component Value Date/Time   COLORURINE YELLOW (A) 01/11/2021 1429   APPEARANCEUR Cloudy (A) 06/26/2021 1540   LABSPEC 1.017 01/11/2021 1429   PHURINE 9.0 (H) 01/11/2021 1429   GLUCOSEU CANCELED 06/26/2021 1540   HGBUR NEGATIVE 01/11/2021 1429   BILIRUBINUR NEGATIVE 01/11/2021 1429   BILIRUBINUR Negative 08/01/2019 1038   KETONESUR NEGATIVE 01/11/2021 1429   PROTEINUR CANCELED 06/26/2021 1540   PROTEINUR NEGATIVE 01/11/2021 1429   NITRITE NEGATIVE 01/11/2021 1429   LEUKOCYTESUR NEGATIVE  01/11/2021 1429   Sepsis Labs: @LABRCNTIP (procalcitonin:4,lacticidven:4)  ) Recent Results (from the past 240 hour(s))  Resp Panel by RT-PCR (Flu A&B, Covid) Nasopharyngeal Swab     Status: None   Collection Time: 09/05/21 11:14 AM   Specimen: Nasopharyngeal Swab; Nasopharyngeal(NP) swabs in vial transport medium  Result Value Ref Range Status   SARS Coronavirus 2 by RT PCR NEGATIVE NEGATIVE Final    Comment: (NOTE) SARS-CoV-2 target nucleic acids are NOT DETECTED.  The SARS-CoV-2 RNA is generally detectable in upper respiratory specimens during the acute phase of infection. The lowest concentration of SARS-CoV-2 viral copies this assay can detect is 138 copies/mL. A negative result does not preclude SARS-Cov-2 infection and should not be used as the sole basis for treatment or other patient management decisions. A negative result may occur with  improper specimen collection/handling, submission of specimen other than nasopharyngeal swab, presence of viral mutation(s) within the areas targeted by this assay, and  inadequate number of viral copies(<138 copies/mL). A negative result must be combined with clinical observations, patient history, and epidemiological information. The expected result is Negative.  Fact Sheet for Patients:  EntrepreneurPulse.com.au  Fact Sheet for Healthcare Providers:  IncredibleEmployment.be  This test is no t yet approved or cleared by the [place] FDA and  has been authorized for detection and/or diagnosis of SARS-CoV-2 by FDA under an Emergency Use Authorization (EUA). This EUA will remain  in effect (meaning this test can be used) for the duration of the COVID-19 declaration under Section 564(b)(1) of the Act, 21 U.S.C.section 360bbb-3(b)(1), unless the authorization is terminated  or revoked sooner.       Influenza A by PCR NEGATIVE NEGATIVE Final   Influenza B by PCR NEGATIVE NEGATIVE Final     Comment: (NOTE) The Xpert Xpress SARS-CoV-2/FLU/RSV plus assay is intended as an aid in the diagnosis of influenza from Nasopharyngeal swab specimens and should not be used as a sole basis for treatment. Nasal washings and aspirates are unacceptable for Xpert Xpress SARS-CoV-2/FLU/RSV testing.  Fact Sheet for Patients: EntrepreneurPulse.com.au  Fact Sheet for Healthcare Providers: IncredibleEmployment.be  This test is not yet approved or cleared by the [place] FDA and has been authorized for detection and/or diagnosis of SARS-CoV-2 by FDA under an Emergency Use Authorization (EUA). This EUA will remain in effect (meaning this test can be used) for the duration of the COVID-19 declaration under Section 564(b)(1) of the Act, 21 U.S.C. section 360bbb-3(b)(1), unless the authorization is terminated or revoked.  Performed at [facility], [address] [place], [place] [address]   Culture, blood (Routine X 2) w Reflex to ID Panel     Status: None (Preliminary result)   Collection Time: 09/05/21 11:41 AM   Specimen: BLOOD  Result Value Ref Range Status   Specimen Description BLOOD BLOOD LEFT HAND  Final   Special Requests   Final    BOTTLES DRAWN AEROBIC AND ANAEROBIC Blood Culture adequate volume   Culture   Final    NO GROWTH < 24 HOURS Performed at [facility], [address] [place], [place] [address]    Report Status PENDING  Incomplete  Culture, blood (Routine X 2) w Reflex to ID Panel     Status: None (Preliminary result)   Collection Time: 09/05/21 11:49 AM   Specimen: BLOOD  Result Value Ref Range Status   Specimen Description BLOOD RIGHT [facility]  Final   Special Requests   Final    BOTTLES DRAWN AEROBIC AND ANAEROBIC Blood Culture adequate volume   Culture   Final    NO GROWTH < 24 HOURS Performed at [facility], [address], [place], [place] [address]    Report Status PENDING  Incomplete          Radiology Studies: CT CHEST WO CONTRAST  Result Date: 09/05/2021 CLINICAL DATA:  Shortness of breath, cough. EXAM: CT CHEST WITHOUT CONTRAST TECHNIQUE: Multidetector CT imaging of the chest was performed following the standard protocol without IV contrast. RADIATION DOSE REDUCTION: This exam was performed according to the departmental dose-optimization program which includes automated exposure control, adjustment of the mA and/or kV according to patient size and/or use of iterative reconstruction technique. COMPARISON:  Dec 07, 2020. FINDINGS: Cardiovascular: Atherosclerosis of thoracic aorta is noted without aneurysm formation. Normal cardiac size. No pericardial effusion. Mediastinum/Nodes: No enlarged mediastinal or axillary lymph nodes. Thyroid gland, trachea, and esophagus demonstrate no significant findings. Lungs/Pleura: No pneumothorax or pleural effusion  is noted. Stable right apical scarring is noted. Interval development of patchy airspace opacities in the right lower and middle lobes concerning for multifocal pneumonia. Upper Abdomen: No acute abnormality. Musculoskeletal: No chest wall mass or suspicious bone lesions identified. IMPRESSION: Multiple patchy airspace opacities are noted in the right lower and middle lobes concerning for multifocal pneumonia. Aortic Atherosclerosis (ICD10-I70.0). Electronically Signed   By: Marijo Conception M.D.   On: 09/05/2021 13:51   DG Chest Portable 1 View  Result Date: 09/05/2021 CLINICAL DATA:  Cough, shortness of breath EXAM: PORTABLE CHEST 1 VIEW COMPARISON:  Previous studies including the examination of 08/21/2021 FINDINGS: Cardiac size is within normal limits. There are no signs of alveolar pulmonary edema or definite focal pulmonary consolidation. There is asymmetric increased density in the left lower lung fields which may be related to right mastectomy. Possibility of early infiltrate in the left lower lung fields is not excluded. There is no  pleural effusion or pneumothorax. IMPRESSION: Asymmetric haziness in the left lower lung fields may be related to right mastectomy. Possibility of early infiltrate in the left lower lung fields is not excluded. If clinically warranted, short-term follow-up PA and lateral views of chest may be considered. Electronically Signed   By: Elmer Picker M.D.   On: 09/05/2021 12:12        Scheduled Meds:  atorvastatin  20 mg Oral Daily   cholecalciferol  1,000 Units Oral Daily   diclofenac  50 mg Oral TID   enoxaparin (LOVENOX) injection  40 mg Subcutaneous Q24H   fluticasone furoate-vilanterol  1 puff Inhalation Daily   And   umeclidinium bromide  1 puff Inhalation Daily   folic acid  1 mg Oral Daily   furosemide  40 mg Oral Daily   gabapentin  800 mg Oral TID   guaiFENesin  1,200 mg Oral BID   insulin aspart  0-15 Units Subcutaneous TID WC   metoprolol succinate  12.5 mg Oral Daily   montelukast  10 mg Oral QHS   predniSONE  40 mg Oral Q breakfast   rOPINIRole  0.25 mg Oral QHS   venlafaxine XR  150 mg Oral Daily   Continuous Infusions:   LOS: 1 day    Time spent: 40 min    Desma Maxim, MD [facility]   If 7PM-7AM, please contact night-coverage [email or web address] Password [facility] 09/06/2021, 9:16 AM

## 2021-09-06 NOTE — Progress Notes (Signed)
Inpatient Diabetes Program Recommendations  AACE/ADA: New Consensus Statement on Inpatient Glycemic Control (2015)  Target Ranges:  Prepandial:   less than 140 mg/dL      Peak postprandial:   less than 180 mg/dL (1-2 hours)      Critically ill patients:  140 - 180 mg/dL   Lab Results  Component Value Date   GLUCAP 240 (H) 09/06/2021   HGBA1C 8.0 (H) 09/05/2021    Review of Glycemic Control  Latest Reference Range & Units 09/05/21 16:30 09/05/21 21:35 09/06/21 09:06  Glucose-Capillary 70 - 99 mg/dL 332 (H) Novolog 11 units 268 (H) 240 (H)  (H): Data is abnormally high  Diabetes history: DM2 Outpatient Diabetes medications: Tresiba 50 units qd, Trulicity 4.5 q week, Metformin 1 gm bid Current orders for Inpatient glycemic control: Novolog correction 0-15 units tid  Inpatient Diabetes Program Recommendations:   -Add Semglee 25 units (50% home basal) -Novolog correction 0-5 units hs Secure chat sent to Dr. Si Raider.  Thank you, Nani Gasser. Morelia Cassells, RN, MSN, CDE  Diabetes Coordinator Inpatient Glycemic Control Team Team Pager [phone] (8am-5pm) 09/06/2021 9:22 AM

## 2021-09-07 LAB — BASIC METABOLIC PANEL
Anion gap: 7 (ref 5–15)
BUN: 14 mg/dL (ref 8–23)
CO2: 32 mmol/L (ref 22–32)
Calcium: 8.9 mg/dL (ref 8.9–10.3)
Chloride: 99 mmol/L (ref 98–111)
Creatinine, Ser: 0.72 mg/dL (ref 0.44–1.00)
GFR, Estimated: >60 mL/min (ref >60)
Glucose, Bld: 151 mg/dL — ABNORMAL HIGH (ref 70–99)
Potassium: 4.5 mmol/L (ref 3.5–5.1)
Sodium: 138 mmol/L (ref 135–145)

## 2021-09-07 LAB — STREP PNEUMONIAE URINARY ANTIGEN: Strep Pneumo Urinary Antigen: NEGATIVE

## 2021-09-07 LAB — GLUCOSE, CAPILLARY
Glucose-Capillary: 164 mg/dL — ABNORMAL HIGH (ref 70–99)
Glucose-Capillary: 223 mg/dL — ABNORMAL HIGH (ref 70–99)
Glucose-Capillary: 340 mg/dL — ABNORMAL HIGH (ref 70–99)
Glucose-Capillary: 307 mg/dL — ABNORMAL HIGH (ref 70–99)

## 2021-09-07 MED ORDER — DEXTROMETHORPHAN POLISTIREX ER 30 MG/5ML PO SUER
60.0000 mg | Freq: Two times a day (BID) | ORAL | Status: DC
  Administered 2021-09-07 – 2021-09-08 (×3): 60 mg via ORAL
  Filled 2021-09-07 (×4): qty 10

## 2021-09-07 NOTE — Progress Notes (Signed)
PROGRESS NOTE    Jordan Chan  ERD:[id number] DOB: [birth year] DOA: 09/05/2021 PCP: [name], NP  Outpatient Specialists: cardiology, pain medicine, oncology    Brief Narrative:  From admission h and p Jordan Chan is a 71 y.o. female with medical history significant for diabetes mellitus, history of breast cancer status post mastectomy and chemotherapy, history of COPD with chronic respiratory failure on 2 L of oxygen at night, chronic back pain, dyslipidemia who presents to the ER via EMS for evaluation of a 3-day history of a cough productive of yellowish green phlegm associated with worsening shortness of breath from her baseline.  She has not had a fever and denies having any chills. She also has had nausea and emesis as well as diarrhea. EMS administered Solu-Medrol 125 mg IV as well as 2 DuoNeb treatments prior to her arrival to the ER. Patient states that she was treated for pneumonia about a month ago. She denies having any chest pain, no headache, no blurred vision, no dizziness, no palpitations, no diaphoresis, no urinary symptoms, no focal deficits.     Assessment & Plan:   Principal Problem:   Sepsis due to pneumonia [facility]) Active Problems:   Chronic kidney disease, stage III (moderate) (HCC)   Insulin dependent type 2 diabetes mellitus (HCC)   Chronic HFrEF (heart failure with reduced ejection fraction) ([place])   COPD with acute exacerbation ([place])  # COPD with acute exacerbation # Community acquired pneumonia, viral Treated for CAP in December w/ augmentin and azithromycin, was flu positive then. Reports she improved but never fully recovered, now several days of worsening productive cough and shortness of breath. Respiratory panel positive for metapneumovirus - levofloxacin (continuing given treating copd exacerbation), prednisone. Abx started 2/2 - f/u urine antigens, blood culture, sputum culture - duoneb q6, albuterol q2 prn - home inhalers - add  dextromethorphan for cough - PT consult  # Acute hypoxic respiratory failure On 2 L [place] at night at home. Here o2 upper 80s on room air, resolved w/ 2 L. O2 wnl this morning off o2 but patient has requested o2 for comfort - monitor off O2, institute only if indicated  # T2DM Here glucose mild elevation - SSI - semglee 25  # MDD - home venlafaxine  # HFrEF Hx stress-induced cardiomyopathy. Most recent EF 45% - cont home lasix, metop  # Chronic pain Has intrathecal pump - home gabapentin, diclofenac     DVT prophylaxis: lovenox Code Status: full Family Communication: none @ bedside  Level of care: Med-Surg Status is: Inpatient Remains inpatient appropriate because: severity of illness     Consultants:  none  Procedures: none  Antimicrobials:  Cefepime/azith>levo    Subjective: Lots of coughing overnight, doesn't feel improved  Objective: Vitals:   09/06/21 2012 09/07/21 0153 09/07/21 0419 09/07/21 0810  BP:   119/69 126/63  Pulse: 77 72 82 86  Resp: 20 20 16 19   Temp:   97.9 F (36.6 C) 97.7 F (36.5 C)  TempSrc:   Oral Oral  SpO2: 99% 97% 100% 100%  Weight:      Height:        Intake/Output Summary (Last 24 hours) at 09/07/2021 1044 Last data filed at 09/07/2021 0513 Gross per 24 hour  Intake 2686.17 ml  Output 0 ml  Net 2686.17 ml   Filed Weights   09/05/21 1112 09/06/21 0531  Weight: 68.5 kg 67.5 kg    Examination:  General exam: anxious, coughing Respiratory system: rhonchi throughout,  faint exp wheeze Cardiovascular system: S1 & S2 heard, RRR. No JVD, murmurs, rubs, gallops or clicks. No pedal edema. Gastrointestinal system: Abdomen is nondistended, soft and nontender. No organomegaly or masses felt. Normal bowel sounds heard. Central nervous system: Alert and oriented. No focal neurological deficits. Extremities: Symmetric 5 x 5 power. Skin: No rashes, lesions or ulcers Psychiatry: Judgement and insight appear normal. Mood & affect  appropriate.     Data Reviewed: I have personally reviewed following labs and imaging studies  CBC: Recent Labs  Lab 09/05/21 1114 09/06/21 0638  WBC 21.3* 19.3*  NEUTROABS 15.4*  --   HGB 12.6 10.7*  HCT 39.1 33.7*  MCV 95.4 95.7  PLT 428* 283   Basic Metabolic Panel: Recent Labs  Lab 09/05/21 1114 09/06/21 0638 09/07/21 0554  NA 132* 136 138  K 3.2* 4.2 4.5  CL 91* 100 99  CO2 29 27 32  GLUCOSE 287* 198* 151*  BUN 14 17 14   CREATININE 0.89 0.80 0.72  CALCIUM 8.5* 8.2* 8.9   GFR: Estimated Creatinine Clearance: 52.4 mL/min (by C-G formula based on SCr of 0.72 mg/dL). Liver Function Tests: Recent Labs  Lab 09/05/21 1114  AST 19  ALT 12  ALKPHOS 58  BILITOT 0.5  PROT 8.2*  ALBUMIN 3.7   Recent Labs  Lab 09/05/21 1114  LIPASE 32   No results for input(s): AMMONIA in the last 168 hours. Coagulation Profile: Recent Labs  Lab 09/06/21 0638  INR 1.2   Cardiac Enzymes: No results for input(s): CKTOTAL, CKMB, CKMBINDEX, TROPONINI in the last 168 hours. BNP (last 3 results) No results for input(s): PROBNP in the last 8760 hours. HbA1C: Recent Labs    09/05/21 1455  HGBA1C 8.0*   CBG: Recent Labs  Lab 09/06/21 0906 09/06/21 1113 09/06/21 1628 09/06/21 2121 09/07/21 0812  GLUCAP 240* 138* 364* 325* 164*   Lipid Profile: No results for input(s): CHOL, HDL, LDLCALC, TRIG, CHOLHDL, LDLDIRECT in the last 72 hours. Thyroid Function Tests: No results for input(s): TSH, T4TOTAL, FREET4, T3FREE, THYROIDAB in the last 72 hours. Anemia Panel: No results for input(s): VITAMINB12, FOLATE, FERRITIN, TIBC, IRON, RETICCTPCT in the last 72 hours. Urine analysis:    Component Value Date/Time   COLORURINE YELLOW (A) 01/11/2021 1429   APPEARANCEUR Cloudy (A) 06/26/2021 1540   LABSPEC 1.017 01/11/2021 1429   PHURINE 9.0 (H) 01/11/2021 1429   GLUCOSEU CANCELED 06/26/2021 1540   HGBUR NEGATIVE 01/11/2021 1429   BILIRUBINUR NEGATIVE 01/11/2021 1429    BILIRUBINUR Negative 08/01/2019 1038   [place] 01/11/2021 1429   PROTEINUR CANCELED 06/26/2021 1540   PROTEINUR NEGATIVE 01/11/2021 1429   NITRITE NEGATIVE 01/11/2021 1429   LEUKOCYTESUR NEGATIVE 01/11/2021 1429   Sepsis Labs: @LABRCNTIP (procalcitonin:4,lacticidven:4)  ) Recent Results (from the past 240 hour(s))  Resp Panel by RT-PCR (Flu A&B, Covid) Nasopharyngeal Swab     Status: None   Collection Time: 09/05/21 11:14 AM   Specimen: Nasopharyngeal Swab; Nasopharyngeal(NP) swabs in vial transport medium  Result Value Ref Range Status   SARS Coronavirus 2 by RT PCR NEGATIVE NEGATIVE Final    Comment: (NOTE) SARS-CoV-2 target nucleic acids are NOT DETECTED.  The SARS-CoV-2 RNA is generally detectable in upper respiratory specimens during the acute phase of infection. The lowest concentration of SARS-CoV-2 viral copies this assay can detect is 138 copies/mL. A negative result does not preclude SARS-Cov-2 infection and should not be used as the sole basis for treatment or other patient management decisions. A negative result may occur  with  improper specimen collection/handling, submission of specimen other than nasopharyngeal swab, presence of viral mutation(s) within the areas targeted by this assay, and inadequate number of viral copies(<138 copies/mL). A negative result must be combined with clinical observations, patient history, and epidemiological information. The expected result is Negative.  Fact Sheet for Patients:  EntrepreneurPulse.com.au  Fact Sheet for Healthcare Providers:  IncredibleEmployment.be  This test is no t yet approved or cleared by the [place] FDA and  has been authorized for detection and/or diagnosis of SARS-CoV-2 by FDA under an Emergency Use Authorization (EUA). This EUA will remain  in effect (meaning this test can be used) for the duration of the COVID-19 declaration under Section 564(b)(1) of  the Act, 21 U.S.C.section 360bbb-3(b)(1), unless the authorization is terminated  or revoked sooner.       Influenza A by PCR NEGATIVE NEGATIVE Final   Influenza B by PCR NEGATIVE NEGATIVE Final    Comment: (NOTE) The Xpert Xpress SARS-CoV-2/FLU/RSV plus assay is intended as an aid in the diagnosis of influenza from Nasopharyngeal swab specimens and should not be used as a sole basis for treatment. Nasal washings and aspirates are unacceptable for Xpert Xpress SARS-CoV-2/FLU/RSV testing.  Fact Sheet for Patients: EntrepreneurPulse.com.au  Fact Sheet for Healthcare Providers: IncredibleEmployment.be  This test is not yet approved or cleared by the [place] FDA and has been authorized for detection and/or diagnosis of SARS-CoV-2 by FDA under an Emergency Use Authorization (EUA). This EUA will remain in effect (meaning this test can be used) for the duration of the COVID-19 declaration under Section 564(b)(1) of the Act, 21 U.S.C. section 360bbb-3(b)(1), unless the authorization is terminated or revoked.  Performed at [facility], [address] [place], [place] [address]   Culture, blood (Routine X 2) w Reflex to ID Panel     Status: None (Preliminary result)   Collection Time: 09/05/21 11:41 AM   Specimen: BLOOD  Result Value Ref Range Status   Specimen Description BLOOD BLOOD LEFT HAND  Final   Special Requests   Final    BOTTLES DRAWN AEROBIC AND ANAEROBIC Blood Culture adequate volume   Culture   Final    NO GROWTH 2 DAYS Performed at [facility], [address] [place], [place] [address]    Report Status PENDING  Incomplete  Culture, blood (Routine X 2) w Reflex to ID Panel     Status: None (Preliminary result)   Collection Time: 09/05/21 11:49 AM   Specimen: BLOOD  Result Value Ref Range Status   Specimen Description BLOOD RIGHT [facility]  Final   Special Requests   Final    BOTTLES DRAWN AEROBIC AND  ANAEROBIC Blood Culture adequate volume   Culture   Final    NO GROWTH 2 DAYS Performed at [facility], [address] [place], [place] [address]    Report Status PENDING  Incomplete  Respiratory (~20 pathogens) panel by PCR     Status: Abnormal   Collection Time: 09/06/21  9:35 AM   Specimen: Nasopharyngeal Swab; Respiratory  Result Value Ref Range Status   Adenovirus NOT DETECTED NOT DETECTED Final   Coronavirus 229E NOT DETECTED NOT DETECTED Final    Comment: (NOTE) The Coronavirus on the Respiratory Panel, DOES NOT test for the novel  Coronavirus (2019 nCoV)    Coronavirus HKU1 NOT DETECTED NOT DETECTED Final   Coronavirus NL63 NOT DETECTED NOT DETECTED Final   Coronavirus OC43 NOT DETECTED NOT DETECTED Final   Metapneumovirus DETECTED (  A) NOT DETECTED Final   Rhinovirus / Enterovirus NOT DETECTED NOT DETECTED Final   Influenza A NOT DETECTED NOT DETECTED Final   Influenza B NOT DETECTED NOT DETECTED Final   Parainfluenza Virus 1 NOT DETECTED NOT DETECTED Final   Parainfluenza Virus 2 NOT DETECTED NOT DETECTED Final   Parainfluenza Virus 3 NOT DETECTED NOT DETECTED Final   Parainfluenza Virus 4 NOT DETECTED NOT DETECTED Final   Respiratory Syncytial Virus NOT DETECTED NOT DETECTED Final   Bordetella pertussis NOT DETECTED NOT DETECTED Final   Bordetella Parapertussis NOT DETECTED NOT DETECTED Final   Chlamydophila pneumoniae NOT DETECTED NOT DETECTED Final   Mycoplasma pneumoniae NOT DETECTED NOT DETECTED Final    Comment: Performed at [facility], [place] [address], [place], [place] [address]         Radiology Studies: CT CHEST WO CONTRAST  Result Date: 09/05/2021 CLINICAL DATA:  Shortness of breath, cough. EXAM: CT CHEST WITHOUT CONTRAST TECHNIQUE: Multidetector CT imaging of the chest was performed following the standard protocol without IV contrast. RADIATION DOSE REDUCTION: This exam was performed according to the departmental dose-optimization  program which includes automated exposure control, adjustment of the mA and/or kV according to patient size and/or use of iterative reconstruction technique. COMPARISON:  Dec 07, 2020. FINDINGS: Cardiovascular: Atherosclerosis of thoracic aorta is noted without aneurysm formation. Normal cardiac size. No pericardial effusion. Mediastinum/Nodes: No enlarged mediastinal or axillary lymph nodes. Thyroid gland, trachea, and esophagus demonstrate no significant findings. Lungs/Pleura: No pneumothorax or pleural effusion is noted. Stable right apical scarring is noted. Interval development of patchy airspace opacities in the right lower and middle lobes concerning for multifocal pneumonia. Upper Abdomen: No acute abnormality. Musculoskeletal: No chest wall mass or suspicious bone lesions identified. IMPRESSION: Multiple patchy airspace opacities are noted in the right lower and middle lobes concerning for multifocal pneumonia. Aortic Atherosclerosis (ICD10-I70.0). Electronically Signed   By: Marijo Conception M.D.   On: 09/05/2021 13:51   DG Chest Portable 1 View  Result Date: 09/05/2021 CLINICAL DATA:  Cough, shortness of breath EXAM: PORTABLE CHEST 1 VIEW COMPARISON:  Previous studies including the examination of 08/21/2021 FINDINGS: Cardiac size is within normal limits. There are no signs of alveolar pulmonary edema or definite focal pulmonary consolidation. There is asymmetric increased density in the left lower lung fields which may be related to right mastectomy. Possibility of early infiltrate in the left lower lung fields is not excluded. There is no pleural effusion or pneumothorax. IMPRESSION: Asymmetric haziness in the left lower lung fields may be related to right mastectomy. Possibility of early infiltrate in the left lower lung fields is not excluded. If clinically warranted, short-term follow-up PA and lateral views of chest may be considered. Electronically Signed   By: Elmer Picker M.D.   On:  09/05/2021 12:12        Scheduled Meds:  atorvastatin  20 mg Oral Daily   cholecalciferol  1,000 Units Oral Daily   diclofenac  50 mg Oral TID   enoxaparin (LOVENOX) injection  40 mg Subcutaneous Q24H   fluticasone furoate-vilanterol  1 puff Inhalation Daily   folic acid  1 mg Oral Daily   furosemide  40 mg Oral Daily   gabapentin  800 mg Oral TID   guaiFENesin  1,200 mg Oral BID   insulin aspart  0-15 Units Subcutaneous TID AC & HS   insulin glargine-yfgn  25 Units Subcutaneous Daily   ipratropium-albuterol  3 mL Nebulization Q6H   levofloxacin  750 mg Oral QHS   metoprolol succinate  12.5 mg Oral Daily   montelukast  10 mg Oral QHS   predniSONE  40 mg Oral Q breakfast   rOPINIRole  0.25 mg Oral QHS   venlafaxine XR  150 mg Oral Daily   Continuous Infusions:   LOS: 2 days    Time spent: 30 min    Desma Maxim, MD [facility]   If 7PM-7AM, please contact night-coverage [email or web address] Password [email or web address] 09/07/2021, 10:44 AM

## 2021-09-08 LAB — CBC
HCT: 35.2 % — ABNORMAL LOW (ref 36.0–46.0)
Hemoglobin: 11.1 g/dL — ABNORMAL LOW (ref 12.0–15.0)
MCH: 30.2 pg (ref 26.0–34.0)
MCHC: 31.5 g/dL (ref 30.0–36.0)
MCV: 95.7 fL (ref 80.0–100.0)
Platelets: 370 10*3/uL (ref 150–400)
RBC: 3.68 MIL/uL — ABNORMAL LOW (ref 3.87–5.11)
RDW: 13.5 % (ref 11.5–15.5)
WBC: 15.5 10*3/uL — ABNORMAL HIGH (ref 4.0–10.5)
nRBC: 0 % (ref 0.0–0.2)

## 2021-09-08 LAB — BASIC METABOLIC PANEL
Anion gap: 13 (ref 5–15)
BUN: 16 mg/dL (ref 8–23)
CO2: 28 mmol/L (ref 22–32)
Calcium: 9 mg/dL (ref 8.9–10.3)
Chloride: 94 mmol/L — ABNORMAL LOW (ref 98–111)
Creatinine, Ser: 0.92 mg/dL (ref 0.44–1.00)
GFR, Estimated: >60 mL/min (ref >60)
Glucose, Bld: 237 mg/dL — ABNORMAL HIGH (ref 70–99)
Potassium: 3.7 mmol/L (ref 3.5–5.1)
Sodium: 135 mmol/L (ref 135–145)

## 2021-09-08 LAB — GLUCOSE, CAPILLARY: Glucose-Capillary: 182 mg/dL — ABNORMAL HIGH (ref 70–99)

## 2021-09-08 MED ORDER — PREDNISONE 20 MG PO TABS: 40.0000 mg | ORAL_TABLET | Freq: Every day | ORAL | 0 refills | Status: DC

## 2021-09-08 MED ORDER — LEVOFLOXACIN 750 MG PO TABS: 750.0000 mg | ORAL_TABLET | ORAL | Status: DC

## 2021-09-08 MED ORDER — FLUTICASONE PROPIONATE 50 MCG/ACT NA SUSP
1.0000 | Freq: Every day | NASAL | Status: DC
  Administered 2021-09-08 (×2): 1 via NASAL
  Filled 2021-09-08: qty 16

## 2021-09-08 MED ORDER — LEVOFLOXACIN 750 MG PO TABS: 750.0000 mg | ORAL_TABLET | Freq: Once | ORAL | 0 refills | Status: AC

## 2021-09-08 MED ORDER — SALINE SPRAY 0.65 % NA SOLN
1.0000 | NASAL | Status: DC | PRN
  Filled 2021-09-08: qty 44

## 2021-09-08 NOTE — Progress Notes (Signed)
Pt has a history of right mastectomy due to breast cancer. At this time, patient has a 20g IV in her right extremity. When this nurse attempts to relocate her IV to her left arm, the patient refuses and she states that her oncologist states that "it is ok for IV use and only to not do BP in that arm due to the longevity of the surgery no complications should arise with IV insertion". IV remains in her right arm with blood return noted.

## 2021-09-08 NOTE — Discharge Summary (Signed)
Jordan Chan TMH:[id number] DOB: 1950-09-08 DOA: 09/05/2021  PCP: [name], NP  Admit date: 09/05/2021 Discharge date: 09/08/2021  Time spent: 34 minutes  Recommendations for Outpatient Follow-up:  Pcp and/or pulmonology f/u     Discharge Diagnoses:  Principal Problem:   Sepsis due to pneumonia [facility]) Active Problems:   Chronic kidney disease, stage III (moderate) (HCC)   Insulin dependent type 2 diabetes mellitus ([place])   Chronic HFrEF (heart failure with reduced ejection fraction) ([place])   COPD with acute exacerbation ([place])   Discharge Condition: improved  Diet recommendation: carb modified  Filed Weights   09/05/21 1112 09/06/21 0531  Weight: 68.5 kg 67.5 kg    History of present illness:  Jordan Chan is a 71 y.o. female with medical history significant for diabetes mellitus, history of breast cancer status post mastectomy and chemotherapy, history of COPD with chronic respiratory failure on 2 L of oxygen at night, chronic back pain, dyslipidemia who presents to the ER via EMS for evaluation of a 3-day history of a cough productive of yellowish green phlegm associated with worsening shortness of breath from her baseline.  She has not had a fever and denies having any chills. She also has had nausea and emesis as well as diarrhea. EMS administered Solu-Medrol 125 mg IV as well as 2 DuoNeb treatments prior to her arrival to the ER. Patient states that she was treated for pneumonia about a month ago. She denies having any chest pain, no headache, no blurred vision, no dizziness, no palpitations, no diaphoresis, no urinary symptoms, no focal deficits.  Hospital Course:  # COPD with acute exacerbation # Community acquired pneumonia, viral Treated for CAP in December w/ augmentin and azithromycin, was flu positive then. Reports she improved but never fully recovered, now several days of worsening productive cough and shortness of breath. Respiratory panel positive  for metapneumovirus. Symptomatically much improved w/ steroids and abx. - finish course levofloxacin and steroids - resume home inhalers - albuterol scheduled and prn until symptoms resolve   # Acute hypoxic respiratory failure On 2 L [place] at night at home. Here o2 upper 80s on room air, treated w/ [place] O2, resolved   # T2DM Here glucose mild elevation with steroids    # HFrEF Hx stress-induced cardiomyopathy. Most recent EF 45%. Here compensated. Home meds continued  Procedures: none   Consultations: none  Discharge Exam: Vitals:   09/08/21 0320 09/08/21 0834  BP: 109/68 124/63  Pulse: 89 92  Resp: 16 18  Temp: 97.6 F (36.4 C) 98.4 F (36.9 C)  SpO2: 99% 98%    General exam: anxious, coughing Respiratory system: rhonchi throughout, faint exp wheeze Cardiovascular system: S1 & S2 heard, RRR. No JVD, murmurs, rubs, gallops or clicks. No pedal edema. Gastrointestinal system: Abdomen is nondistended, soft and nontender. No organomegaly or masses felt. Normal bowel sounds heard. Central nervous system: Alert and oriented. No focal neurological deficits. Extremities: Symmetric 5 x 5 power. Skin: No rashes, lesions or ulcers Psychiatry: Judgement and insight appear normal. Mood & affect appropriate.   Discharge Instructions   Discharge Instructions     Diet Carb Modified   Complete by: As directed    Increase activity slowly   Complete by: As directed       Allergies as of 09/08/2021       Reactions   Other Palpitations   IV steroids   Pain Patch [menthol] Anaphylaxis   Avelox [moxifloxacin Hcl In Nacl] Other (See Comments)  Upset stomach   Doxycycline Diarrhea   Erythromycin Nausea Only, Other (See Comments)   Can take a Z-Pak just fine Other reaction(s): Other (See Comments) Can take Z-Pak Can take a Z-Pak just fine   Fentanyl Nausea Only, Rash   Moxifloxacin Hcl Other (See Comments)   Upset stomach Upset stomach   Oxycontin [oxycodone] Hives   Ozempic  [semaglutide] Nausea Only        Medication List     TAKE these medications    Accu-Chek Aviva Plus test strip Generic drug: glucose blood TEST THREE TIMES DAILY   Accu-Chek Softclix Lancets lancets TEST BLOOD SUGAR THREE TIMES DAILY   albuterol 108 (90 Base) MCG/ACT inhaler Commonly known as: VENTOLIN HFA Inhale 2 puffs into the lungs every 6 (six) hours as needed for wheezing or shortness of breath.   atorvastatin 20 MG tablet Commonly known as: LIPITOR Take 1 tablet (20 mg total) by mouth daily.   B-D SINGLE USE SWABS REGULAR Pads USE TWICE DAILY  WITH  SUGAR  CHECKS   cholecalciferol 25 MCG (1000 UNIT) tablet Commonly known as: VITAMIN D3 Take 1,000 Units by mouth daily.   Cyanocobalamin 1000 MCG/ML Kit Inject 1,000 mcg as directed every 30 (thirty) days.   cyclobenzaprine 5 MG tablet Commonly known as: FLEXERIL Take 5 mg by mouth 2 (two) times daily as needed for muscle spasms. Takes very rarely   diclofenac 50 MG EC tablet Commonly known as: VOLTAREN TAKE 1 TABLET THREE TIMES DAILY   folic acid 1 MG tablet Commonly known as: FOLVITE TAKE 1 TABLET EVERY DAY   furosemide 40 MG tablet Commonly known as: LASIX TAKE 1 TABLET EVERY DAY   gabapentin 800 MG tablet Commonly known as: NEURONTIN Take 1 tablet (800 mg total) by mouth 3 (three) times daily.   lactulose 10 GM/15ML solution Commonly known as: CHRONULAC Take 45 mLs (30 g total) by mouth daily as needed for mild constipation or severe constipation.   levofloxacin 750 MG tablet Commonly known as: LEVAQUIN Take 1 tablet (750 mg total) by mouth once for 1 dose. Start taking on: September 09, 2021   metFORMIN 1000 MG tablet Commonly known as: GLUCOPHAGE Take 1 tablet (1,000 mg total) by mouth 2 (two) times daily with a meal.   metoprolol succinate 25 MG 24 hr tablet Commonly known as: TOPROL-XL TAKE 1/2 TABLET EVERY DAY   montelukast 10 MG tablet Commonly known as: SINGULAIR Take 10 mg by  mouth at bedtime.   PAIN MANAGEMENT INTRATHECAL (IT) PUMP 1 each by Intrathecal route. Intrathecal (IT) medication:  Morphine Patient does not remember current. Adjusted every 2.5 months at [facility] in [place].   predniSONE 20 MG tablet Commonly known as: DELTASONE Take 2 tablets (40 mg total) by mouth daily with breakfast. Start taking on: September 09, 2021   rOPINIRole 0.25 MG tablet Commonly known as: Requip Take 1 tablet (0.25 mg total) by mouth at bedtime.   Trelegy Ellipta 100-62.5-25 MCG/ACT Aepb Generic drug: Fluticasone-Umeclidin-Vilant INHALE 1 PUFF INTO THE LUNGS DAILY.   Tyler Aas FlexTouch 100 UNIT/ML FlexTouch Pen Generic drug: insulin degludec INJECT 30 UNITS INTO THE SKIN AT BEDTIME What changed: See the new instructions.   True Metrix Meter w/Device Kit Use to check blood sugar 4 times a day   Trulicity 4.5 TY/6.0AY Sopn Generic drug: Dulaglutide Inject 4.5 mg as directed once a week.   venlafaxine XR 150 MG 24 hr capsule Commonly known as: EFFEXOR-XR Take 1 capsule (150 mg total)  by mouth daily.       Allergies  Allergen Reactions   Other Palpitations    IV steroids   Pain Patch [Menthol] Anaphylaxis   Avelox [Moxifloxacin Hcl In Nacl] Other (See Comments)    Upset stomach   Doxycycline Diarrhea   Erythromycin Nausea Only and Other (See Comments)    Can take a Z-Pak just fine Other reaction(s): Other (See Comments) Can take Z-Pak  Can take a Z-Pak just fine   Fentanyl Nausea Only and Rash   Moxifloxacin Hcl Other (See Comments)    Upset stomach Upset stomach   Oxycontin [Oxycodone] Hives   Ozempic [Semaglutide] Nausea Only    Follow-up Information     [name], NP Follow up.   Specialty: Nurse Practitioner Contact information: [place] [address] [phone]         Erby Pian, MD Follow up.   Specialty: Specialist Contact information: [place]  [address] [phone]                  The results of significant diagnostics from this hospitalization (including imaging, microbiology, ancillary and laboratory) are listed below for reference.    Significant Diagnostic Studies: DG Chest 2 View  Result Date: 08/21/2021 CLINICAL DATA:  Recent pneumonia, difficulty breathing. EXAM: CHEST - 2 VIEW COMPARISON:  07/31/2021 and CT chest 12/07/2020. FINDINGS: Trachea is midline. Heart size normal. No airspace consolidation or pleural fluid. Old right rib fractures. IMPRESSION: No acute findings. Electronically Signed   By: Lorin Picket M.D.   On: 08/21/2021 14:50   CT CHEST WO CONTRAST  Result Date: 09/05/2021 CLINICAL DATA:  Shortness of breath, cough. EXAM: CT CHEST WITHOUT CONTRAST TECHNIQUE: Multidetector CT imaging of the chest was performed following the standard protocol without IV contrast. RADIATION DOSE REDUCTION: This exam was performed according to the departmental dose-optimization program which includes automated exposure control, adjustment of the mA and/or kV according to patient size and/or use of iterative reconstruction technique. COMPARISON:  Dec 07, 2020. FINDINGS: Cardiovascular: Atherosclerosis of thoracic aorta is noted without aneurysm formation. Normal cardiac size. No pericardial effusion. Mediastinum/Nodes: No enlarged mediastinal or axillary lymph nodes. Thyroid gland, trachea, and esophagus demonstrate no significant findings. Lungs/Pleura: No pneumothorax or pleural effusion is noted. Stable right apical scarring is noted. Interval development of patchy airspace opacities in the right lower and middle lobes concerning for multifocal pneumonia. Upper Abdomen: No acute abnormality. Musculoskeletal: No chest wall mass or suspicious bone lesions identified. IMPRESSION: Multiple patchy airspace opacities are noted in the right lower and middle lobes concerning for multifocal pneumonia. Aortic Atherosclerosis (ICD10-I70.0).  Electronically Signed   By: Marijo Conception M.D.   On: 09/05/2021 13:51   DG Chest Portable 1 View  Result Date: 09/05/2021 CLINICAL DATA:  Cough, shortness of breath EXAM: PORTABLE CHEST 1 VIEW COMPARISON:  Previous studies including the examination of 08/21/2021 FINDINGS: Cardiac size is within normal limits. There are no signs of alveolar pulmonary edema or definite focal pulmonary consolidation. There is asymmetric increased density in the left lower lung fields which may be related to right mastectomy. Possibility of early infiltrate in the left lower lung fields is not excluded. There is no pleural effusion or pneumothorax. IMPRESSION: Asymmetric haziness in the left lower lung fields may be related to right mastectomy. Possibility of early infiltrate in the left lower lung fields is not excluded. If clinically warranted, short-term follow-up PA and lateral views of chest may be  considered. Electronically Signed   By: Elmer Picker M.D.   On: 09/05/2021 12:12    Microbiology: Recent Results (from the past 240 hour(s))  Resp Panel by RT-PCR (Flu A&B, Covid) Nasopharyngeal Swab     Status: None   Collection Time: 09/05/21 11:14 AM   Specimen: Nasopharyngeal Swab; Nasopharyngeal(NP) swabs in vial transport medium  Result Value Ref Range Status   SARS Coronavirus 2 by RT PCR NEGATIVE NEGATIVE Final    Comment: (NOTE) SARS-CoV-2 target nucleic acids are NOT DETECTED.  The SARS-CoV-2 RNA is generally detectable in upper respiratory specimens during the acute phase of infection. The lowest concentration of SARS-CoV-2 viral copies this assay can detect is 138 copies/mL. A negative result does not preclude SARS-Cov-2 infection and should not be used as the sole basis for treatment or other patient management decisions. A negative result may occur with  improper specimen collection/handling, submission of specimen other than nasopharyngeal swab, presence of viral mutation(s) within the areas  targeted by this assay, and inadequate number of viral copies(<138 copies/mL). A negative result must be combined with clinical observations, patient history, and epidemiological information. The expected result is Negative.  Fact Sheet for Patients:  EntrepreneurPulse.com.au  Fact Sheet for Healthcare Providers:  IncredibleEmployment.be  This test is no t yet approved or cleared by the [place] FDA and  has been authorized for detection and/or diagnosis of SARS-CoV-2 by FDA under an Emergency Use Authorization (EUA). This EUA will remain  in effect (meaning this test can be used) for the duration of the COVID-19 declaration under Section 564(b)(1) of the Act, 21 U.S.C.section 360bbb-3(b)(1), unless the authorization is terminated  or revoked sooner.       Influenza A by PCR NEGATIVE NEGATIVE Final   Influenza B by PCR NEGATIVE NEGATIVE Final    Comment: (NOTE) The Xpert Xpress SARS-CoV-2/FLU/RSV plus assay is intended as an aid in the diagnosis of influenza from Nasopharyngeal swab specimens and should not be used as a sole basis for treatment. Nasal washings and aspirates are unacceptable for Xpert Xpress SARS-CoV-2/FLU/RSV testing.  Fact Sheet for Patients: EntrepreneurPulse.com.au  Fact Sheet for Healthcare Providers: IncredibleEmployment.be  This test is not yet approved or cleared by the [place] FDA and has been authorized for detection and/or diagnosis of SARS-CoV-2 by FDA under an Emergency Use Authorization (EUA). This EUA will remain in effect (meaning this test can be used) for the duration of the COVID-19 declaration under Section 564(b)(1) of the Act, 21 U.S.C. section 360bbb-3(b)(1), unless the authorization is terminated or revoked.  Performed at [facility], [address] [place], [place] [address]   Culture, blood (Routine X 2) w Reflex to ID Panel     Status:  None (Preliminary result)   Collection Time: 09/05/21 11:41 AM   Specimen: BLOOD  Result Value Ref Range Status   Specimen Description BLOOD BLOOD LEFT HAND  Final   Special Requests   Final    BOTTLES DRAWN AEROBIC AND ANAEROBIC Blood Culture adequate volume   Culture   Final    NO GROWTH 3 DAYS Performed at [facility], [address] [place], [place] [address]    Report Status PENDING  Incomplete  Culture, blood (Routine X 2) w Reflex to ID Panel     Status: None (Preliminary result)   Collection Time: 09/05/21 11:49 AM   Specimen: BLOOD  Result Value Ref Range Status   Specimen Description BLOOD RIGHT [facility]  Final   Special Requests  Final    BOTTLES DRAWN AEROBIC AND ANAEROBIC Blood Culture adequate volume   Culture   Final    NO GROWTH 3 DAYS Performed at [facility], [address] [place], [place] [address]    Report Status PENDING  Incomplete  Respiratory (~20 pathogens) panel by PCR     Status: Abnormal   Collection Time: 09/06/21  9:35 AM   Specimen: Nasopharyngeal Swab; Respiratory  Result Value Ref Range Status   Adenovirus NOT DETECTED NOT DETECTED Final   Coronavirus 229E NOT DETECTED NOT DETECTED Final    Comment: (NOTE) The Coronavirus on the Respiratory Panel, DOES NOT test for the novel  Coronavirus (2019 nCoV)    Coronavirus HKU1 NOT DETECTED NOT DETECTED Final   Coronavirus NL63 NOT DETECTED NOT DETECTED Final   Coronavirus OC43 NOT DETECTED NOT DETECTED Final   Metapneumovirus DETECTED (A) NOT DETECTED Final   Rhinovirus / Enterovirus NOT DETECTED NOT DETECTED Final   Influenza A NOT DETECTED NOT DETECTED Final   Influenza B NOT DETECTED NOT DETECTED Final   Parainfluenza Virus 1 NOT DETECTED NOT DETECTED Final   Parainfluenza Virus 2 NOT DETECTED NOT DETECTED Final   Parainfluenza Virus 3 NOT DETECTED NOT DETECTED Final   Parainfluenza Virus 4 NOT DETECTED NOT DETECTED Final   Respiratory Syncytial Virus NOT DETECTED NOT  DETECTED Final   Bordetella pertussis NOT DETECTED NOT DETECTED Final   Bordetella Parapertussis NOT DETECTED NOT DETECTED Final   Chlamydophila pneumoniae NOT DETECTED NOT DETECTED Final   Mycoplasma pneumoniae NOT DETECTED NOT DETECTED Final    Comment: Performed at [facility], [address] [place], [place] [address]     Labs: Basic Metabolic Panel: Recent Labs  Lab 09/05/21 1114 09/06/21 0638 09/07/21 0554 09/08/21 0427  NA 132* 136 138 135  K 3.2* 4.2 4.5 3.7  CL 91* 100 99 94*  CO2 29 27 32 28  GLUCOSE 287* 198* 151* 237*  BUN 14 17 14 16   CREATININE 0.89 0.80 0.72 0.92  CALCIUM 8.5* 8.2* 8.9 9.0   Liver Function Tests: Recent Labs  Lab 09/05/21 1114  AST 19  ALT 12  ALKPHOS 58  BILITOT 0.5  PROT 8.2*  ALBUMIN 3.7   Recent Labs  Lab 09/05/21 1114  LIPASE 32   No results for input(s): AMMONIA in the last 168 hours. CBC: Recent Labs  Lab 09/05/21 1114 09/06/21 0638 09/08/21 0427  WBC 21.3* 19.3* 15.5*  NEUTROABS 15.4*  --   --   HGB 12.6 10.7* 11.1*  HCT 39.1 33.7* 35.2*  MCV 95.4 95.7 95.7  PLT 428* 375 370   Cardiac Enzymes: No results for input(s): CKTOTAL, CKMB, CKMBINDEX, TROPONINI in the last 168 hours. BNP: BNP (last 3 results) Recent Labs    12/07/20 1036 09/05/21 1114  BNP 12.6 29.7    ProBNP (last 3 results) No results for input(s): PROBNP in the last 8760 hours.  CBG: Recent Labs  Lab 09/07/21 0812 09/07/21 1129 09/07/21 1628 09/07/21 2113 09/08/21 0748  GLUCAP 164* 223* 340* 307* 182*       Signed:  Desma Maxim MD.  [facility] 09/08/2021, 9:47 AM

## 2021-09-08 NOTE — Progress Notes (Signed)
PHARMACY NOTE:  ANTIMICROBIAL RENAL DOSAGE ADJUSTMENT  Current antimicrobial regimen includes a mismatch between antimicrobial dosage and estimated renal function.  As per policy approved by the Pharmacy & Therapeutics and Medical Executive Committees, the antimicrobial dosage will be adjusted accordingly.  Current antimicrobial dosage:   Levaquin 750 mg po Q24h  Indication: AECOPD, recent PNA  Renal Function:  Estimated Creatinine Clearance: 45.6 mL/min (by C-G formula based on SCr of 0.92 mg/dL).     Antimicrobial dosage has been changed to:   Levaquin 750 mg po q48h x 1 more dose on 09/09/21 for Crcl < 50 ml/min  Additional comments:   Thank you for allowing pharmacy to be a part of this patient's care.  Nathaniel Wakeley A, [facility] 09/08/2021 9:36 AM

## 2021-09-08 NOTE — Plan of Care (Signed)
IV removed, discharge instructions reviewed, VSS and patient discharged to home with husband

## 2021-09-08 NOTE — Evaluation (Signed)
Physical Therapy Evaluation Patient Details Name: Jordan Chan MRN: [id number] DOB: 23-Jul-1951 Today's Date: 09/08/2021  History of Present Illness  Pt is a 71 y.o. female with medical history significant for diabetes mellitus, history of breast cancer status post mastectomy and chemotherapy, history of COPD with chronic respiratory failure on 2 L of oxygen at night, chronic back pain, dyslipidemia who presents to the ER via EMS for evaluation of a 3-day history of a cough productive of yellowish green phlegm associated with worsening shortness of breath. MD assessment includes: Sepsis due to pneumonia, COPD exacerbation, and acute hypoxic resp fail.   Clinical Impression  Pt was pleasant and motivated to participate during the session and put forth good effort throughout. Pt ind/Mod ind with all functional tasks with SpO2 and HR both WNL on room air.  Pt able to easily amb 150' and ascend/descend stairs with good control and stability throughout including during dynamic gait assessment.  Pt reports no PT needs at this time.  Will complete PT orders at this time but will reassess pt pending a change in status upon receipt of new PT orders.         Recommendations for follow up therapy are one component of a multi-disciplinary discharge planning process, led by the attending physician.  Recommendations may be updated based on patient status, additional functional criteria and insurance authorization.  Follow Up Recommendations No PT follow up    Assistance Recommended at Discharge    Patient can return home with the following  Other (comment) (None)    Equipment Recommendations None recommended by PT  Recommendations for Other Services       Functional Status Assessment Patient has not had a recent decline in their functional status     Precautions / Restrictions Precautions Precautions: None Restrictions Weight Bearing Restrictions: No      Mobility  Bed Mobility Overal  bed mobility: Independent                  Transfers Overall transfer level: Independent Equipment used: None               General transfer comment: Good eccentric and concentric control and stability    Ambulation/Gait   Gait Distance (Feet): 150 Feet Assistive device: None Gait Pattern/deviations: WFL(Within Functional Limits)       General Gait Details: Good stability including during dynamic gait with head turns, start/stops, and rapid 180 deg turns  Stairs Stairs: Yes Stairs assistance: Modified independent (Device/Increase time) Stair Management: One rail Left, Forwards, Alternating pattern Number of Stairs: 4 General stair comments: Very good eccentric and concentric control and stability  Wheelchair Mobility    Modified Rankin (Stroke Patients Only)       Balance Overall balance assessment: Independent                                           Pertinent Vitals/Pain Pain Assessment Pain Assessment: 0-10 Pain Score: 5  Pain Location: chronic back pain Pain Descriptors / Indicators: Sore Pain Intervention(s): Monitored during session, Repositioned    Home Living Family/patient expects to be discharged to:: Private residence Living Arrangements: Children Available Help at Discharge: Family;Available 24 hours/day Type of Home: House Home Access: Stairs to enter Entrance Stairs-Rails: Right;Left;Can reach both Entrance Stairs-Number of Steps: 2   Home Layout: One level Home Equipment: Conservation officer, nature (2 wheels);Cane - single point;BSC/3in1  Prior Function Prior Level of Function : Independent/Modified Independent             Mobility Comments: Ind amb community distances without an AD, no fall history ADLs Comments: Ind with ADLs     Hand Dominance        Extremity/Trunk Assessment   Upper Extremity Assessment Upper Extremity Assessment: Overall WFL for tasks assessed    Lower Extremity  Assessment Lower Extremity Assessment: Overall WFL for tasks assessed       Communication   Communication: No difficulties  Cognition Arousal/Alertness: Awake/alert Behavior During Therapy: WFL for tasks assessed/performed Overall Cognitive Status: Within Functional Limits for tasks assessed                                          General Comments General comments (skin integrity, edema, etc.): BLE SLS > 8 sec    Exercises     Assessment/Plan    PT Assessment Patient does not need any further PT services  PT Problem List         PT Treatment Interventions      PT Goals (Current goals can be found in the Care Plan section)  Acute Rehab PT Goals PT Goal Formulation: All assessment and education complete, DC therapy    Frequency       Co-evaluation               AM-PAC PT "6 Clicks" Mobility  Outcome Measure Help needed turning from your back to your side while in a flat bed without using bedrails?: None Help needed moving from lying on your back to sitting on the side of a flat bed without using bedrails?: None Help needed moving to and from a bed to a chair (including a wheelchair)?: None Help needed standing up from a chair using your arms (e.g., wheelchair or bedside chair)?: None Help needed to walk in hospital room?: None Help needed climbing 3-5 steps with a railing? : None 6 Click Score: 24    End of Session Equipment Utilized During Treatment: Gait belt Activity Tolerance: Patient tolerated treatment well Patient left: in chair;with call bell/phone within reach Nurse Communication: Mobility status PT Visit Diagnosis: Muscle weakness (generalized) (M62.81)    Time: 8453-6468 PT Time Calculation (min) (ACUTE ONLY): 14 min   Charges:   PT Evaluation $PT Eval Low Complexity: 1 Low         D. Royetta Asal PT, DPT 09/08/21, 9:47 AM

## 2021-09-08 NOTE — TOC Transition Note (Signed)
Transition of Care [facility]) - CM/SW Discharge Note   Patient Details  Name: Jordan Chan MRN: [id number] Date of Birth: 03/11/1951  Transition of Care [facility] CM/SW Contact:  Izola Price, RN Phone Number: 09/08/2021, 10:37 AM   Clinical Narrative: 2/5: Patient being discharged today. No TOC needs identified/ordered. Home/self care. Simmie Davies RN CM       Final next level of care: Home/Self Care Barriers to Discharge: Barriers Resolved   Patient Goals and CMS Choice        Discharge Placement                       Discharge Plan and Services                DME Arranged: N/A DME Agency: NA       HH Arranged: NA HH Agency: NA        Social Determinants of Health (SDOH) Interventions     Readmission Risk Interventions No flowsheet data found.

## 2021-09-09 LAB — LEGIONELLA PNEUMOPHILA SEROGP 1 UR AG: L. pneumophila Serogp 1 Ur Ag: NEGATIVE

## 2021-09-10 ENCOUNTER — Telehealth: Payer: Self-pay | Admitting: *Deleted

## 2021-09-10 ENCOUNTER — Ambulatory Visit: Payer: Self-pay | Admitting: *Deleted

## 2021-09-10 NOTE — Telephone Encounter (Signed)
Pt really wanted to see you but I explained to her that she needed a 40 minute appt. Pt became upset and started to cry but I was able to get her to accept the appt I found tomorrow with Dr. Wynetta Emery.

## 2021-09-10 NOTE — Telephone Encounter (Signed)
Transition Care Management Follow-up Telephone Call Date of discharge and from where: [facility] How have you been since you were released from the hospital? Feeling nausea, is drinking but unable to hold down a lot of food.   Any questions or concerns? Yes Patient was complaining of neck pain which is a chronic issue.     Items Reviewed: Did the pt receive and understand the discharge instructions provided? Yes  Medications obtained and verified? Yes  Other? No  Any new allergies since your discharge? No  Dietary orders reviewed? Yes Do you have support at home? Yes   Home Care and Equipment/Supplies: Were home health services ordered? not applicable If so, what is the name of the agency?   Has the agency set up a time to come to the patient's home? not applicable Were any new equipment or medical supplies ordered?  No What is the name of the medical supply agency?  Were you able to get the supplies/equipment? not applicable Do you have any questions related to the use of the equipment or supplies? No  Functional Questionnaire: (I = Independent and D = Dependent) ADLs: I   Bathing/Dressing- I  Meal Prep- I  Eating- I  Maintaining continence- I  Transferring/Ambulation- I  Managing Meds- I  Follow up appointments reviewed:  PCP Hospital f/u appt confirmed? No  patient was advised to call office she has an appointment scheduled 09-17-2021 for a follow up for RLS  not a hospital follow up.  Stated she was just going to come in with her son who is scheduled on Thursday was advised she would need an appointment and not be able to be seen with him.  She voiced understanding. [facility] f/u appt confirmed? No   Are transportation arrangements needed? No  If their condition worsens, is the pt aware to call PCP or go to the Emergency Dept.? No Was the patient provided with contact information for the PCP's office or ED? Yes Was to pt encouraged to call  back with questions or concerns? Yes

## 2021-09-10 NOTE — Telephone Encounter (Signed)
°  Chief Complaint: Needs hospital f/u appt but she is vomiting and having nausea since being discharged on Sunday from the hospital Symptoms: vomiting every time she eats and having nausea Frequency: Since Sunday after being discharged from the hospital Pertinent Negatives: Patient denies diarrhea Disposition: [] ED /[] Urgent Care (no appt availability in office) / [x] Appointment(In office/virtual)/ []  [facility]/ [] Home Care/ [] Refused Recommended Disposition /[] [facility]/ []  Follow-up with PCP Additional Notes: I called into the office and spoke with Ubaldo Glassing because the next hosp f/u appt wasn't until the end of Feb.    They are going to check with [name], NP about getting her in sooner and give the pt a call back.   Pt was agreeable to this plan.

## 2021-09-10 NOTE — Telephone Encounter (Signed)
Reason for Disposition  [1] MILD or MODERATE vomiting AND [2] present > 48 hours (2 days) (Exception: mild vomiting with associated diarrhea)  Answer Assessment - Initial Assessment Questions 1. VOMITING SEVERITY: "How many times have you vomited in the past 24 hours?"     - MILD:  1 - 2 times/day    - MODERATE: 3 - 5 times/day, decreased oral intake without significant weight loss or symptoms of dehydration    - SEVERE: 6 or more times/day, vomits everything or nearly everything, with significant weight loss, symptoms of dehydration      I just got out of the hospital Sunday.    Every time I try to eat I get nauseated.   I just vomited up my lunch.  I've only kept 2 bites of a BBQ sandwich down since discharge.   I don't understand why I can't eat. 2. ONSET: "When did the vomiting begin?"      Since being discharged from hospital.     My neck is hurting so bad.  It was hurting before I went to the hospital and it's getting worse.    3. FLUIDS: "What fluids or food have you vomited up today?" "Have you been able to keep any fluids down?"     I'm keeping fluids down but can't eat. 4. ABDOMINAL PAIN: "Are your having any abdominal pain?" If yes : "How bad is it and what does it feel like?" (e.g., crampy, dull, intermittent, constant)      *No Answer* 5. DIARRHEA: "Is there any diarrhea?" If Yes, ask: "How many times today?"      No 6. CONTACTS: "Is there anyone else in the family with the same symptoms?"      *No Answer* 7. CAUSE: "What do you think is causing your vomiting?"     *No Answer* 8. HYDRATION STATUS: "Any signs of dehydration?" (e.g., dry mouth [not only dry lips], too weak to stand) "When did you last urinate?"     *No Answer* 9. OTHER SYMPTOMS: "Do you have any other symptoms?" (e.g., fever, headache, vertigo, vomiting blood or coffee grounds, recent head injury)     *No Answer* 10. PREGNANCY: "Is there any chance you are pregnant?" "When was your last menstrual period?"        *No Answer*  Protocols used: Vomiting-A-AH

## 2021-09-10 NOTE — Telephone Encounter (Signed)
Pt was in the hospital and she was released Sunday and is still having issues. [facility] nurse wants to schedule HFU but there are no 40 minute slots available until 2/27. Nurse is sending over triage and will route. Please advise.

## 2021-09-11 ENCOUNTER — Emergency Department: Payer: Medicare HMO

## 2021-09-11 ENCOUNTER — Inpatient Hospital Stay
Admission: EM | Admit: 2021-09-11 | Discharge: 2021-09-14 | DRG: 871 | Disposition: A | Discharge status: Home / Self Care | Payer: Medicare HMO | Attending: Internal Medicine | Admitting: Internal Medicine

## 2021-09-11 ENCOUNTER — Inpatient Hospital Stay (HOSPITAL_COMMUNITY)
Admit: 2021-09-11 | Discharge: 2021-09-11 | Disposition: A | Discharge status: Home / Self Care | Payer: Medicare HMO | Attending: Pulmonary Disease | Admitting: Pulmonary Disease

## 2021-09-11 ENCOUNTER — Inpatient Hospital Stay: Payer: Medicare HMO | Admitting: Family Medicine

## 2021-09-11 ENCOUNTER — Ambulatory Visit: Payer: Self-pay | Admitting: *Deleted

## 2021-09-11 DIAGNOSIS — I130 Hypertensive heart and chronic kidney disease with heart failure and stage 1 through stage 4 chronic kidney disease, or unspecified chronic kidney disease: Secondary | ICD-10-CM | POA: Diagnosis present

## 2021-09-11 DIAGNOSIS — K219 Gastro-esophageal reflux disease without esophagitis: Secondary | ICD-10-CM | POA: Diagnosis present

## 2021-09-11 DIAGNOSIS — R0600 Dyspnea, unspecified: Secondary | ICD-10-CM | POA: Diagnosis not present

## 2021-09-11 DIAGNOSIS — R110 Nausea: Secondary | ICD-10-CM | POA: Diagnosis not present

## 2021-09-11 DIAGNOSIS — Z9221 Personal history of antineoplastic chemotherapy: Secondary | ICD-10-CM

## 2021-09-11 DIAGNOSIS — Z7985 Long-term (current) use of injectable non-insulin antidiabetic drugs: Secondary | ICD-10-CM | POA: Diagnosis not present

## 2021-09-11 DIAGNOSIS — E1122 Type 2 diabetes mellitus with diabetic chronic kidney disease: Secondary | ICD-10-CM | POA: Diagnosis present

## 2021-09-11 DIAGNOSIS — R6521 Severe sepsis with septic shock: Secondary | ICD-10-CM | POA: Diagnosis not present

## 2021-09-11 DIAGNOSIS — E861 Hypovolemia: Secondary | ICD-10-CM | POA: Diagnosis present

## 2021-09-11 DIAGNOSIS — N183 Chronic kidney disease, stage 3 unspecified: Secondary | ICD-10-CM | POA: Diagnosis present

## 2021-09-11 DIAGNOSIS — I5022 Chronic systolic (congestive) heart failure: Secondary | ICD-10-CM | POA: Diagnosis not present

## 2021-09-11 DIAGNOSIS — E876 Hypokalemia: Secondary | ICD-10-CM | POA: Diagnosis present

## 2021-09-11 DIAGNOSIS — I4891 Unspecified atrial fibrillation: Secondary | ICD-10-CM

## 2021-09-11 DIAGNOSIS — Z7951 Long term (current) use of inhaled steroids: Secondary | ICD-10-CM

## 2021-09-11 DIAGNOSIS — R6520 Severe sepsis without septic shock: Secondary | ICD-10-CM | POA: Diagnosis present

## 2021-09-11 DIAGNOSIS — Z853 Personal history of malignant neoplasm of breast: Secondary | ICD-10-CM | POA: Diagnosis not present

## 2021-09-11 DIAGNOSIS — Z7984 Long term (current) use of oral hypoglycemic drugs: Secondary | ICD-10-CM | POA: Diagnosis not present

## 2021-09-11 DIAGNOSIS — Z79899 Other long term (current) drug therapy: Secondary | ICD-10-CM

## 2021-09-11 DIAGNOSIS — E1140 Type 2 diabetes mellitus with diabetic neuropathy, unspecified: Secondary | ICD-10-CM | POA: Diagnosis present

## 2021-09-11 DIAGNOSIS — Z20822 Contact with and (suspected) exposure to covid-19: Secondary | ICD-10-CM | POA: Diagnosis present

## 2021-09-11 DIAGNOSIS — I480 Paroxysmal atrial fibrillation: Secondary | ICD-10-CM | POA: Diagnosis not present

## 2021-09-11 DIAGNOSIS — J440 Chronic obstructive pulmonary disease with acute lower respiratory infection: Secondary | ICD-10-CM | POA: Diagnosis present

## 2021-09-11 DIAGNOSIS — J441 Chronic obstructive pulmonary disease with (acute) exacerbation: Secondary | ICD-10-CM | POA: Diagnosis not present

## 2021-09-11 DIAGNOSIS — R000 Tachycardia, unspecified: Secondary | ICD-10-CM | POA: Diagnosis not present

## 2021-09-11 DIAGNOSIS — I213 ST elevation (STEMI) myocardial infarction of unspecified site: Secondary | ICD-10-CM | POA: Diagnosis not present

## 2021-09-11 DIAGNOSIS — J9621 Acute and chronic respiratory failure with hypoxia: Secondary | ICD-10-CM | POA: Diagnosis present

## 2021-09-11 DIAGNOSIS — J189 Pneumonia, unspecified organism: Secondary | ICD-10-CM | POA: Diagnosis present

## 2021-09-11 DIAGNOSIS — N281 Cyst of kidney, acquired: Secondary | ICD-10-CM | POA: Diagnosis not present

## 2021-09-11 DIAGNOSIS — A419 Sepsis, unspecified organism: Principal | ICD-10-CM | POA: Diagnosis present

## 2021-09-11 DIAGNOSIS — I428 Other cardiomyopathies: Secondary | ICD-10-CM | POA: Diagnosis present

## 2021-09-11 DIAGNOSIS — E7800 Pure hypercholesterolemia, unspecified: Secondary | ICD-10-CM | POA: Diagnosis present

## 2021-09-11 DIAGNOSIS — Z86711 Personal history of pulmonary embolism: Secondary | ICD-10-CM

## 2021-09-11 DIAGNOSIS — R112 Nausea with vomiting, unspecified: Secondary | ICD-10-CM | POA: Diagnosis not present

## 2021-09-11 DIAGNOSIS — R1310 Dysphagia, unspecified: Secondary | ICD-10-CM | POA: Diagnosis not present

## 2021-09-11 DIAGNOSIS — R0602 Shortness of breath: Secondary | ICD-10-CM | POA: Diagnosis present

## 2021-09-11 LAB — RESPIRATORY PANEL BY PCR

## 2021-09-11 LAB — URINALYSIS, COMPLETE (UACMP) WITH MICROSCOPIC
Bacteria, UA: NONE SEEN
Bilirubin Urine: NEGATIVE
Glucose, UA: NEGATIVE mg/dL
Hgb urine dipstick: NEGATIVE
Ketones, ur: NEGATIVE mg/dL
Leukocytes,Ua: NEGATIVE
Nitrite: NEGATIVE
Protein, ur: NEGATIVE mg/dL
Specific Gravity, Urine: 1.032 — ABNORMAL HIGH (ref 1.005–1.030)
pH: 5 (ref 5.0–8.0)

## 2021-09-11 LAB — CBC
HCT: 40.5 % (ref 36.0–46.0)
Hemoglobin: 12.7 g/dL (ref 12.0–15.0)
MCH: 29.5 pg (ref 26.0–34.0)
MCHC: 31.4 g/dL (ref 30.0–36.0)
MCV: 94.2 fL (ref 80.0–100.0)
Platelets: 481 10*3/uL — ABNORMAL HIGH (ref 150–400)
RBC: 4.3 MIL/uL (ref 3.87–5.11)
RDW: 13.9 % (ref 11.5–15.5)
WBC: 16.4 10*3/uL — ABNORMAL HIGH (ref 4.0–10.5)
nRBC: 0 % (ref 0.0–0.2)

## 2021-09-11 LAB — COMPREHENSIVE METABOLIC PANEL
ALT: 16 U/L (ref 0–44)
AST: 19 U/L (ref 15–41)
Albumin: 3.2 g/dL — ABNORMAL LOW (ref 3.5–5.0)
Alkaline Phosphatase: 48 U/L (ref 38–126)
Anion gap: 9 (ref 5–15)
BUN: 21 mg/dL (ref 8–23)
CO2: 29 mmol/L (ref 22–32)
Calcium: 8.3 mg/dL — ABNORMAL LOW (ref 8.9–10.3)
Chloride: 98 mmol/L (ref 98–111)
Creatinine, Ser: 0.91 mg/dL (ref 0.44–1.00)
GFR, Estimated: >60 mL/min (ref >60)
Glucose, Bld: 122 mg/dL — ABNORMAL HIGH (ref 70–99)
Potassium: 3.4 mmol/L — ABNORMAL LOW (ref 3.5–5.1)
Sodium: 136 mmol/L (ref 135–145)
Total Bilirubin: 0.6 mg/dL (ref 0.3–1.2)
Total Protein: 7 g/dL (ref 6.5–8.1)

## 2021-09-11 LAB — PROTIME-INR
INR: 1 (ref 0.8–1.2)
Prothrombin Time: 13.2 seconds (ref 11.4–15.2)

## 2021-09-11 LAB — TROPONIN I (HIGH SENSITIVITY)
Troponin I (High Sensitivity): 16 ng/L (ref <18)
Troponin I (High Sensitivity): 16 ng/L (ref <18)

## 2021-09-11 LAB — ECHOCARDIOGRAM COMPLETE: S' Lateral: 3.58 cm

## 2021-09-11 LAB — PROCALCITONIN: Procalcitonin: <0.1 ng/mL

## 2021-09-11 LAB — STREP PNEUMONIAE URINARY ANTIGEN: Strep Pneumo Urinary Antigen: NEGATIVE

## 2021-09-11 LAB — CBG MONITORING, ED: Glucose-Capillary: 237 mg/dL — ABNORMAL HIGH (ref 70–99)

## 2021-09-11 LAB — GLUCOSE, CAPILLARY: Glucose-Capillary: 245 mg/dL — ABNORMAL HIGH (ref 70–99)

## 2021-09-11 LAB — LACTIC ACID, PLASMA: Lactic Acid, Venous: 1.2 mmol/L (ref 0.5–1.9)

## 2021-09-11 LAB — RESP PANEL BY RT-PCR (FLU A&B, COVID) ARPGX2
Influenza A by PCR: NEGATIVE
Influenza B by PCR: NEGATIVE
SARS Coronavirus 2 by RT PCR: NEGATIVE

## 2021-09-11 LAB — BRAIN NATRIURETIC PEPTIDE: B Natriuretic Peptide: 494.2 pg/mL — ABNORMAL HIGH (ref 0.0–100.0)

## 2021-09-11 LAB — MRSA NEXT GEN BY PCR, NASAL: MRSA by PCR Next Gen: NOT DETECTED

## 2021-09-11 MED ORDER — POTASSIUM CHLORIDE CRYS ER 20 MEQ PO TBCR
40.0000 meq | EXTENDED_RELEASE_TABLET | Freq: Once | ORAL | Status: AC
  Administered 2021-09-11: 40 meq via ORAL
  Filled 2021-09-11: qty 2

## 2021-09-11 MED ORDER — INSULIN ASPART 100 UNIT/ML IJ SOLN
0.0000 [IU] | Freq: Every day | INTRAMUSCULAR | Status: DC
  Administered 2021-09-11 – 2021-09-12 (×2): 2 [IU] via SUBCUTANEOUS
  Administered 2021-09-13: 3 [IU] via SUBCUTANEOUS
  Filled 2021-09-11 (×3): qty 1

## 2021-09-11 MED ORDER — VANCOMYCIN HCL 1500 MG/300ML IV SOLN
1500.0000 mg | Freq: Once | INTRAVENOUS | Status: AC
  Administered 2021-09-11: 1500 mg via INTRAVENOUS
  Filled 2021-09-11: qty 300

## 2021-09-11 MED ORDER — IPRATROPIUM-ALBUTEROL 0.5-2.5 (3) MG/3ML IN SOLN
3.0000 mL | Freq: Once | RESPIRATORY_TRACT | Status: AC
  Administered 2021-09-11: 3 mL via RESPIRATORY_TRACT
  Filled 2021-09-11: qty 3

## 2021-09-11 MED ORDER — METOPROLOL SUCCINATE ER 25 MG PO TB24
12.5000 mg | ORAL_TABLET | Freq: Every day | ORAL | Status: DC
  Administered 2021-09-11 – 2021-09-14 (×4): 12.5 mg via ORAL
  Filled 2021-09-11 (×2): qty 1
  Filled 2021-09-11 (×2): qty 0.5

## 2021-09-11 MED ORDER — LEVALBUTEROL HCL 1.25 MG/0.5ML IN NEBU
1.2500 mg | INHALATION_SOLUTION | Freq: Four times a day (QID) | RESPIRATORY_TRACT | Status: DC
  Administered 2021-09-11 – 2021-09-14 (×9): 1.25 mg via RESPIRATORY_TRACT
  Filled 2021-09-11 (×12): qty 0.5

## 2021-09-11 MED ORDER — POLYETHYLENE GLYCOL 3350 17 G PO PACK: 17.0000 g | PACK | Freq: Every day | ORAL | Status: DC | PRN

## 2021-09-11 MED ORDER — DILTIAZEM HCL-DEXTROSE 125-5 MG/125ML-% IV SOLN (PREMIX)
5.0000 mg/h | INTRAVENOUS | Status: DC
  Administered 2021-09-11: 5 mg/h via INTRAVENOUS
  Filled 2021-09-11: qty 125

## 2021-09-11 MED ORDER — GABAPENTIN 400 MG PO CAPS
800.0000 mg | ORAL_CAPSULE | Freq: Three times a day (TID) | ORAL | Status: DC
  Administered 2021-09-11 – 2021-09-14 (×9): 800 mg via ORAL
  Filled 2021-09-11 (×11): qty 2

## 2021-09-11 MED ORDER — AMIODARONE LOAD VIA INFUSION
150.0000 mg | Freq: Once | INTRAVENOUS | Status: AC
  Administered 2021-09-11: 150 mg via INTRAVENOUS
  Filled 2021-09-11: qty 83.34

## 2021-09-11 MED ORDER — SODIUM CHLORIDE 0.9 % IV BOLUS
1000.0000 mL | Freq: Once | INTRAVENOUS | Status: AC
Start: 2021-09-11 — End: 2021-09-11
  Administered 2021-09-11: 1000 mL via INTRAVENOUS

## 2021-09-11 MED ORDER — SODIUM CHLORIDE 0.9 % IV SOLN: INTRAVENOUS | Status: DC

## 2021-09-11 MED ORDER — SODIUM CHLORIDE 0.9 % IV SOLN
2.0000 g | Freq: Two times a day (BID) | INTRAVENOUS | Status: AC
  Administered 2021-09-11 – 2021-09-12 (×3): 2 g via INTRAVENOUS
  Filled 2021-09-11 (×4): qty 2

## 2021-09-11 MED ORDER — HEPARIN (PORCINE) 25000 UT/250ML-% IV SOLN
850.0000 [IU]/h | INTRAVENOUS | Status: DC
  Administered 2021-09-11: 850 [IU]/h via INTRAVENOUS
  Filled 2021-09-11: qty 250

## 2021-09-11 MED ORDER — INSULIN ASPART 100 UNIT/ML IJ SOLN
0.0000 [IU] | Freq: Three times a day (TID) | INTRAMUSCULAR | Status: DC
  Administered 2021-09-11: 5 [IU] via SUBCUTANEOUS
  Administered 2021-09-12: 8 [IU] via SUBCUTANEOUS
  Administered 2021-09-12 (×2): 5 [IU] via SUBCUTANEOUS
  Administered 2021-09-13: 2 [IU] via SUBCUTANEOUS
  Administered 2021-09-13 (×2): 11 [IU] via SUBCUTANEOUS
  Administered 2021-09-14: 3 [IU] via SUBCUTANEOUS
  Filled 2021-09-11 (×8): qty 1

## 2021-09-11 MED ORDER — HEPARIN BOLUS VIA INFUSION
3600.0000 [IU] | Freq: Once | INTRAVENOUS | Status: AC
  Administered 2021-09-11: 3600 [IU] via INTRAVENOUS
  Filled 2021-09-11: qty 3600

## 2021-09-11 MED ORDER — DOCUSATE SODIUM 100 MG PO CAPS: 100.0000 mg | ORAL_CAPSULE | Freq: Two times a day (BID) | ORAL | Status: DC | PRN

## 2021-09-11 MED ORDER — MONTELUKAST SODIUM 10 MG PO TABS
10.0000 mg | ORAL_TABLET | Freq: Every day | ORAL | Status: DC
  Administered 2021-09-11 – 2021-09-13 (×3): 10 mg via ORAL
  Filled 2021-09-11 (×3): qty 1

## 2021-09-11 MED ORDER — SODIUM CHLORIDE 0.9 % IV BOLUS
1000.0000 mL | Freq: Once | INTRAVENOUS | Status: AC
  Administered 2021-09-11: 1000 mL via INTRAVENOUS

## 2021-09-11 MED ORDER — ACETAMINOPHEN 325 MG PO TABS
650.0000 mg | ORAL_TABLET | Freq: Four times a day (QID) | ORAL | Status: DC | PRN
  Administered 2021-09-11 – 2021-09-14 (×3): 650 mg via ORAL
  Filled 2021-09-11 (×2): qty 2

## 2021-09-11 MED ORDER — BUDESONIDE 0.25 MG/2ML IN SUSP
0.2500 mg | Freq: Two times a day (BID) | RESPIRATORY_TRACT | Status: DC
  Administered 2021-09-12 – 2021-09-14 (×5): 0.25 mg via RESPIRATORY_TRACT
  Filled 2021-09-11 (×5): qty 2

## 2021-09-11 MED ORDER — CHLORHEXIDINE GLUCONATE CLOTH 2 % EX PADS
6.0000 | MEDICATED_PAD | Freq: Every day | CUTANEOUS | Status: DC
  Administered 2021-09-11 – 2021-09-12 (×2): 6 via TOPICAL
  Filled 2021-09-11: qty 6

## 2021-09-11 MED ORDER — SODIUM CHLORIDE 0.9 % IV SOLN
500.0000 mg | INTRAVENOUS | Status: DC
  Filled 2021-09-11: qty 5

## 2021-09-11 MED ORDER — AMIODARONE HCL IN DEXTROSE 360-4.14 MG/200ML-% IV SOLN
30.0000 mg/h | INTRAVENOUS | Status: DC
  Administered 2021-09-11 (×2): 30 mg/h via INTRAVENOUS
  Filled 2021-09-11: qty 200

## 2021-09-11 MED ORDER — ENOXAPARIN SODIUM 40 MG/0.4ML IJ SOSY: 40.0000 mg | PREFILLED_SYRINGE | INTRAMUSCULAR | Status: DC

## 2021-09-11 MED ORDER — ONDANSETRON HCL 4 MG/2ML IJ SOLN
INTRAMUSCULAR | Status: AC
  Administered 2021-09-11: 4 mg
  Filled 2021-09-11: qty 2

## 2021-09-11 MED ORDER — IOHEXOL 350 MG/ML SOLN
75.0000 mL | Freq: Once | INTRAVENOUS | Status: AC | PRN
  Administered 2021-09-11: 75 mL via INTRAVENOUS

## 2021-09-11 MED ORDER — DIGOXIN 0.25 MG/ML IJ SOLN
0.2500 mg | Freq: Once | INTRAMUSCULAR | Status: AC
  Administered 2021-09-11: 0.25 mg via INTRAVENOUS
  Filled 2021-09-11 (×2): qty 2

## 2021-09-11 MED ORDER — SODIUM CHLORIDE 0.9 % IV SOLN: 250.0000 mL | INTRAVENOUS | Status: DC

## 2021-09-11 MED ORDER — NOREPINEPHRINE 4 MG/250ML-% IV SOLN
2.0000 ug/min | INTRAVENOUS | Status: DC
  Administered 2021-09-11: 2 ug/min via INTRAVENOUS
  Filled 2021-09-11: qty 250

## 2021-09-11 MED ORDER — SODIUM CHLORIDE 0.9 % IV SOLN
500.0000 mg | Freq: Once | INTRAVENOUS | Status: AC
  Administered 2021-09-11: 500 mg via INTRAVENOUS
  Filled 2021-09-11: qty 5

## 2021-09-11 MED ORDER — VANCOMYCIN HCL IN DEXTROSE 1-5 GM/200ML-% IV SOLN: 1000.0000 mg | Freq: Once | INTRAVENOUS | Status: DC

## 2021-09-11 MED ORDER — METOPROLOL TARTRATE 5 MG/5ML IV SOLN
INTRAVENOUS | Status: AC
  Filled 2021-09-11: qty 5

## 2021-09-11 MED ORDER — METHYLPREDNISOLONE SODIUM SUCC 40 MG IJ SOLR
40.0000 mg | Freq: Every day | INTRAMUSCULAR | Status: DC
  Administered 2021-09-11 – 2021-09-13 (×3): 40 mg via INTRAVENOUS
  Filled 2021-09-11 (×3): qty 1

## 2021-09-11 MED ORDER — METOPROLOL TARTRATE 5 MG/5ML IV SOLN
5.0000 mg | Freq: Once | INTRAVENOUS | Status: AC
  Administered 2021-09-11: 5 mg via INTRAVENOUS

## 2021-09-11 MED ORDER — VENLAFAXINE HCL ER 75 MG PO CP24
150.0000 mg | ORAL_CAPSULE | Freq: Every day | ORAL | Status: DC
  Administered 2021-09-11 – 2021-09-14 (×4): 150 mg via ORAL
  Filled 2021-09-11 (×3): qty 2
  Filled 2021-09-11: qty 1

## 2021-09-11 MED ORDER — DILTIAZEM LOAD VIA INFUSION
5.0000 mg | Freq: Once | INTRAVENOUS | Status: AC
  Administered 2021-09-11: 5 mg via INTRAVENOUS
  Filled 2021-09-11: qty 5

## 2021-09-11 MED ORDER — DIGOXIN 0.25 MG/ML IJ SOLN
0.5000 mg | Freq: Once | INTRAMUSCULAR | Status: AC
  Administered 2021-09-11: 0.5 mg via INTRAVENOUS
  Filled 2021-09-11: qty 2

## 2021-09-11 MED ORDER — PERFLUTREN LIPID MICROSPHERE
1.0000 mL | INTRAVENOUS | Status: AC | PRN
  Administered 2021-09-11: 2 mL via INTRAVENOUS
  Filled 2021-09-11: qty 10

## 2021-09-11 MED ORDER — AMIODARONE HCL IN DEXTROSE 360-4.14 MG/200ML-% IV SOLN
60.0000 mg/h | INTRAVENOUS | Status: AC
  Administered 2021-09-11 (×2): 60 mg/h via INTRAVENOUS
  Filled 2021-09-11 (×2): qty 200

## 2021-09-11 MED ORDER — ROPINIROLE HCL 0.25 MG PO TABS
0.2500 mg | ORAL_TABLET | Freq: Every day | ORAL | Status: DC
  Administered 2021-09-11 – 2021-09-13 (×3): 0.25 mg via ORAL
  Filled 2021-09-11 (×6): qty 1

## 2021-09-11 MED ORDER — LACTATED RINGERS IV SOLN: INTRAVENOUS | Status: DC

## 2021-09-11 MED ORDER — SODIUM CHLORIDE 0.9 % IV SOLN
2.0000 g | Freq: Once | INTRAVENOUS | Status: AC
  Administered 2021-09-11: 2 g via INTRAVENOUS
  Filled 2021-09-11: qty 2

## 2021-09-11 NOTE — ED Notes (Signed)
Pt's BP continues to be low and HR continues to be high. MD notified.

## 2021-09-11 NOTE — Consult Note (Addendum)
Cardiology Consultation:   [name] MRN: [id number]; DOB: [birth year]  Admit date: 09/11/2021 Date of Consult: 09/11/2021  PCP:  [name], NP   [facility] Providers Cardiologist:  [name], MD        Patient Profile:   Jordan Chan is a 71 y.o. female with a hx of HFrEF, COPD, hypertension who is being seen 09/11/2021 for the evaluation of atrial fibrillation with RVR at the request of Dr. Lanney Gins.  History of Present Illness:   Jordan Chan is a 71 year old female with history of hypertension, hyperlipidemia, HFrEF EF 45%, COPD who presents with worsening shortness of breath, nausea, vomiting and cough.  Patient recently admitted with pneumonia, discharged about 3 days ago.  She states not feeling well since discharge, with persistent fatigue, poor oral intake, shortness of breath.  Symptoms progress over the last 2 to 3 days causing her to return to the emergency room today.  She notes occasional "vibrations" in her heart.  States being compliant with all her medications.  In the ED upon admission, EKG showed atrial fibrillation, nonspecific IVCD, heart rate 155.  Chest CT showed multifocal pneumonia.  Patient started on IV Cardizem but blood pressure reduced Cardizem was stopped.  Amiodarone drip was started with improvement in heart rates currently 130s.  Troponin was normal at 1.2, 1.2.   Past Medical History:  Diagnosis Date   Arthritis    Asthma    Breast cancer ([place]) 1998   right breast ca with mastectomy and chemotherapy and radiation   Bronchitis    CHF (congestive heart failure) ([place])    "with Morphine withdrawal"   COPD (chronic obstructive pulmonary disease) ([place])    Diabetes mellitus without complication ([place])    Diverticulitis    diverticulosis also   Dyspnea    Endometriosis    GERD (gastroesophageal reflux disease)    History of shingles 2000-2005   Hypercholesteremia    Hypertension    IBS (irritable bowel  syndrome)    Low back pain    a. Implanted morphine/bupivicaine/clonidine pump.   Neuropathy    Orthopnea    Oxygen dependent    uses at night   Personal history of chemotherapy    Personal history of radiation therapy    Pneumonia    pneumonia 5-6 times, history of bronchitis also   Scoliosis    Sleep apnea    does not use cpap   Stroke ([place]) 2010   TIA, 10 years ago   Withdrawal from sedative drug (Jordan Chan)    withdrawal from morphine when pump batteries died    Past Surgical History:  Procedure Laterality Date   ABDOMINAL HYSTERECTOMY  1987   BACK SURGERY     Tailbone removed following fracture   BREAST SURGERY Right    mastectomy   CARDIAC CATHETERIZATION     CATARACT EXTRACTION W/PHACO Right 05/19/2019   Procedure: CATARACT EXTRACTION PHACO AND INTRAOCULAR LENS PLACEMENT ([place]), RIGHT, DIABETIC;  Surgeon: Marchia Meiers, MD;  Location: [facility] ORS;  Service: Ophthalmology;  Laterality: Right;  Lot # [id number] H [place]: 00:43.8 CDE: 4.59   CATARACT EXTRACTION W/PHACO Left 06/16/2019   Procedure: CATARACT EXTRACTION PHACO AND INTRAOCULAR LENS PLACEMENT ([place]) LEFT VISION BLUE DIABETIC;  Surgeon: Marchia Meiers, MD;  Location: [facility] ORS;  Service: Ophthalmology;  Laterality: Left;  Lot #[id number] H [place]: 00:46.9 CDE: 6.53   COCCYX REMOVAL     COLONOSCOPY WITH PROPOFOL N/A 01/01/2017   Procedure: COLONOSCOPY WITH PROPOFOL;  Surgeon: Jonathon Bellows, MD;  Location: [facility] ENDOSCOPY;  Service: Endoscopy;  Laterality: N/A;   ELBOW ARTHROSCOPY WITH TENDON RECONSTRUCTION     ESOPHAGOGASTRODUODENOSCOPY (EGD) WITH PROPOFOL N/A 01/01/2017   Procedure: ESOPHAGOGASTRODUODENOSCOPY (EGD) WITH PROPOFOL;  Surgeon: Jonathon Bellows, MD;  Location: [facility] ENDOSCOPY;  Service: Endoscopy;  Laterality: N/A;   INTRATHECAL PUMP IMPLANT     LEFT HEART CATH AND CORONARY ANGIOGRAPHY N/A 04/28/2017   Procedure: LEFT HEART CATH AND CORONARY ANGIOGRAPHY;  Surgeon: [name], MD;  Location: [facility];  Service:  Cardiovascular;  Laterality: N/A;   MASTECTOMY Right 06/1997   morphine pump  2011   PLANTAR FASCIA RELEASE     TRIGGER FINGER RELEASE       Home Medications:  Prior to Admission medications   Medication Sig Start Date End Date Taking? Authorizing Provider  atorvastatin (LIPITOR) 20 MG tablet Take 1 tablet (20 mg total) by mouth daily. 04/29/21  Yes [name], Jolene T, NP  cholecalciferol (VITAMIN D3) 25 MCG (1000 UNIT) tablet Take 1,000 Units by mouth daily.   Yes [provider]  Cyanocobalamin 1000 MCG/ML KIT Inject 1,000 mcg as directed every 30 (thirty) days.   Yes [provider]  cyclobenzaprine (FLEXERIL) 5 MG tablet Take 5 mg by mouth 2 (two) times daily as needed for muscle spasms. Takes very rarely   Yes [provider]  diclofenac (VOLTAREN) 50 MG EC tablet TAKE 1 TABLET THREE TIMES DAILY 08/30/21  Yes [name], Jolene T, NP  Dulaglutide (TRULICITY) 4.5 TM/1.9QQ SOPN Inject 4.5 mg as directed once a week. Patient taking differently: Inject 4.5 mg as directed once a week. Tuesday 07/20/21  Yes [name], Henrine Screws T, NP  folic acid (FOLVITE) 1 MG tablet TAKE 1 TABLET EVERY DAY 02/08/21  Yes Sindy Guadeloupe, MD  furosemide (LASIX) 40 MG tablet TAKE 1 TABLET EVERY DAY 07/15/21  Yes End, Harrell Gave, MD  gabapentin (NEURONTIN) 800 MG tablet Take 1 tablet (800 mg total) by mouth 3 (three) times daily. 10/25/20  Yes [name], Henrine Screws T, NP  metFORMIN (GLUCOPHAGE) 1000 MG tablet Take 1 tablet (1,000 mg total) by mouth 2 (two) times daily with a meal. 10/25/20  Yes [name], Jolene T, NP  metoprolol succinate (TOPROL-XL) 25 MG 24 hr tablet TAKE 1/2 TABLET EVERY DAY Patient taking differently: Take 12.5 mg by mouth daily. 05/13/21  Yes Dunn, Ryan M, PA-C  montelukast (SINGULAIR) 10 MG tablet Take 10 mg by mouth at bedtime.   Yes [provider]  predniSONE (DELTASONE) 20 MG tablet Take 2 tablets (40 mg total) by mouth daily with breakfast. 09/09/21  Yes Wouk, Ailene Rud,  MD  rOPINIRole (REQUIP) 0.25 MG tablet Take 1 tablet (0.25 mg total) by mouth at bedtime. 08/14/21  Yes [name], Jolene T, NP  TRELEGY ELLIPTA 100-62.5-25 MCG/INH AEPB INHALE 1 PUFF INTO THE LUNGS DAILY. 12/03/20  Yes [name], Jolene T, NP  TRESIBA FLEXTOUCH 100 UNIT/ML FlexTouch Pen INJECT 30 UNITS INTO THE SKIN AT BEDTIME Patient taking differently: Inject 50 Units into the skin at bedtime. 05/17/21  Yes [name], Jolene T, NP  venlafaxine XR (EFFEXOR-XR) 150 MG 24 hr capsule Take 1 capsule (150 mg total) by mouth daily. 10/22/20  Yes [name], Jolene T, NP  ACCU-CHEK AVIVA PLUS test strip TEST THREE TIMES DAILY 05/11/21   [name], Henrine Screws T, NP  Accu-Chek Softclix Lancets lancets TEST BLOOD SUGAR THREE TIMES DAILY 01/30/21   [name], Henrine Screws T, NP  albuterol (PROVENTIL HFA;VENTOLIN HFA) 108 (90 BASE) MCG/ACT inhaler Inhale 2 puffs into the lungs every  6 (six) hours as needed for wheezing or shortness of breath.     [provider]  Alcohol Swabs (B-D SINGLE USE SWABS REGULAR) PADS USE TWICE DAILY  WITH  SUGAR  CHECKS 01/16/21   [name], Jolene T, NP  Blood Glucose Monitoring Suppl (TRUE METRIX METER) w/Device KIT Use to check blood sugar 4 times a day 05/14/21   [name], Henrine Screws T, NP  lactulose (CHRONULAC) 10 GM/15ML solution Take 45 mLs (30 g total) by mouth daily as needed for mild constipation or severe constipation. 12/20/19   Loletha Grayer, MD  PAIN MANAGEMENT INTRATHECAL, IT, PUMP 1 each by Intrathecal route. Intrathecal (IT) medication:  Morphine Patient does not remember current. Adjusted every 2.5 months at [facility] in [place].    [provider]    Inpatient Medications: Scheduled Meds:  budesonide (PULMICORT) nebulizer solution  0.25 mg Nebulization BID   digoxin  0.25 mg Intravenous Once   gabapentin  800 mg Oral TID   insulin aspart  0-15 Units Subcutaneous TID WC   insulin aspart  0-5 Units Subcutaneous QHS   levalbuterol  1.25 mg Nebulization  Q6H   methylPREDNISolone (SOLU-MEDROL) injection  40 mg Intravenous Daily   metoprolol succinate  12.5 mg Oral Daily   montelukast  10 mg Oral QHS   rOPINIRole  0.25 mg Oral QHS   venlafaxine XR  150 mg Oral Daily   Continuous Infusions:  sodium chloride     amiodarone 60 mg/hr (09/11/21 1720)   Followed by   amiodarone     [START ON 09/12/2021] azithromycin     ceFEPime (MAXIPIME) IV     heparin 850 Units/hr (09/11/21 1738)   norepinephrine (LEVOPHED) Adult infusion 2 mcg/min (09/11/21 1702)   PRN Meds: docusate sodium, polyethylene glycol  Allergies:    Allergies  Allergen Reactions   Other Palpitations    IV steroids   Pain Patch [Menthol] Anaphylaxis   Avelox [Moxifloxacin Hcl In Nacl] Other (See Comments)    Upset stomach   Doxycycline Diarrhea   Erythromycin Nausea Only and Other (See Comments)    Can take a Z-Pak just fine Other reaction(s): Other (See Comments) Can take Z-Pak  Can take a Z-Pak just fine   Fentanyl Nausea Only and Rash   Moxifloxacin Hcl Other (See Comments)    Upset stomach Upset stomach   Oxycontin [Oxycodone] Hives   Ozempic [Semaglutide] Nausea Only    Social History:   Social History   Socioeconomic History   Marital status: Unknown    Spouse name: Not on file   Number of children: Not on file   Years of education: Not on file   Highest education level: High school graduate  Occupational History   Occupation: retired  Tobacco Use   Smoking status: Former    Packs/day: 1.00    Years: 30.00    Pack years: 30.00    Types: Cigarettes    Quit date: 04/30/2004    Years since quitting: 17.3   Smokeless tobacco: Never  Vaping Use   Vaping Use: Never used  Substance and Sexual Activity   Alcohol use: No   Drug use: No   Sexual activity: Not Currently    Birth control/protection: Post-menopausal  Other Topics Concern   Not on file  Social History Narrative   ** Merged History Encounter **       Social Determinants of Health    Financial Resource Strain: Low Risk    Difficulty of Paying Living Expenses:  Not hard at all  Food Insecurity: No Food Insecurity   Worried About Charity fundraiser in the Last Year: Never true   Ran Out of Food in the Last Year: Never true  Transportation Needs: No Transportation Needs   Lack of Transportation (Medical): No   Lack of Transportation (Non-Medical): No  Physical Activity: Inactive   Days of Exercise per Week: 0 days   Minutes of Exercise per Session: 0 min  Stress: No Stress Concern Present   Feeling of Stress : Not at all  Social Connections: Not on file  Intimate Partner Violence: Not on file    Family History:    Family History  Problem Relation Age of Onset   Cancer Mother 16       lung   Thyroid disease Mother    Cancer Father 1       Pancreatic   Hypertension Sister    Cancer Sister        "cancer on face"   Hyperlipidemia Sister    Osteoporosis Maternal Grandmother    Hyperlipidemia Son    Seizures Son    Cancer Paternal Grandmother        colon   Arthritis Paternal Grandfather    Breast cancer Neg Hx      ROS:  Please see the history of present illness.   All other ROS reviewed and negative.     Physical Exam/Data:   Vitals:   09/11/21 1615 09/11/21 1715 09/11/21 1745 09/11/21 1800  BP: (!) 85/69 99/71 108/82 98/72  Pulse: (!) 134 (!) 130 (!) 132 (!) 133  Resp: 20 18 (!) 22 16  Temp:      TempSrc:      SpO2: 99% 100% 100% 99%    Intake/Output Summary (Last 24 hours) at 09/11/2021 1845 Last data filed at 09/11/2021 1255 Gross per 24 hour  Intake 1100 ml  Output --  Net 1100 ml   Last 3 Weights 09/06/2021 09/05/2021 08/14/2021  Weight (lbs) 148 lb 14.4 oz 151 lb 155 lb 3.2 oz  Weight (kg) 67.541 kg 68.493 kg 70.398 kg     There is no height or weight on file to calculate BMI.  General: Feels tired, mild respiratory distress HEENT: normal Neck: no JVD Vascular: No carotid bruits; Distal pulses 2+ bilaterally Cardiac: Tachycardic,  irregular Lungs: Expiratory wheezing, rhonchorous breath sounds Abd: soft, nontender, no hepatomegaly  Ext: no edema Musculoskeletal:  No deformities, BUE and BLE strength normal and equal Skin: warm and dry  Neuro:  CNs 2-12 intact, no focal abnormalities noted Psych:  Normal affect   EKG:  The EKG was personally reviewed and demonstrates: Atrial fibrillation, heart rate 155 Telemetry:  Telemetry was personally reviewed and demonstrates: Atrial fibrillation, heart rate 133  Relevant CV Studies:  TTE 09/11/2021 1. Left ventricular ejection fraction, by estimation, is 25 to 30%. The  left ventricle has severely decreased function. The left ventricle  demonstrates global hypokinesis. Left ventricular diastolic parameters are  consistent with Grade II diastolic  dysfunction (pseudonormalization).   2. Right ventricular systolic function is moderately reduced. The right  ventricular size is not well visualized.   3. The mitral valve is normal in structure. No evidence of mitral valve  regurgitation.   4. The aortic valve is grossly normal. Aortic valve regurgitation is not  visualized.   5. The inferior vena cava is normal in size with <50% respiratory  variability, suggesting right atrial pressure of 8 mmHg.    TTE  07/2020 1. Left ventricular ejection fraction, by estimation, is 45 to 50%. The  left ventricle has mildly decreased function. The left ventricle  demonstrates global hypokinesis. Left ventricular diastolic parameters are  consistent with Grade II diastolic  dysfunction (pseudonormalization).   2. Right ventricular systolic function is normal. The right ventricular  size is normal.   3. The mitral valve is normal in structure. No evidence of mitral valve  regurgitation.   4. The aortic valve is tricuspid. Aortic valve regurgitation is not  visualized.   5. The inferior vena cava is normal in size with greater than 50%  respiratory variability, suggesting right atrial  pressure of 3 mmHg.   Laboratory Data:  High Sensitivity Troponin:   Recent Labs  Lab 09/05/21 1114 09/05/21 1321 09/11/21 0933 09/11/21 1135  TROPONINIHS _0 Chemistry Recent Labs  Lab 09/07/21 0554 09/08/21 0427 09/11/21 0933  NA 138 135 136  K 4.5 3.7 3.4*  CL 99 94* 98  CO2 32 28 29  GLUCOSE 151* 237* 122*  BUN _1 CREATININE 0.72 0.92 0.91  CALCIUM 8.9 9.0 8.3*  GFRNONAA >60 >60 >60  ANIONGAP _2 Recent Labs  Lab 09/05/21 1114 09/11/21 0933  PROT 8.2* 7.0  ALBUMIN 3.7 3.2*  AST 19 19  ALT 12 16  ALKPHOS 58 48  BILITOT 0.5 0.6   Lipids No results for input(s): CHOL, TRIG, HDL, LABVLDL, LDLCALC, CHOLHDL in the last 168 hours.  Hematology Recent Labs  Lab 09/06/21 3254 09/08/21 0427 09/11/21 0933  WBC 19.3* 15.5* 16.4*  RBC 3.52* 3.68* 4.30  HGB 10.7* 11.1* 12.7  HCT 33.7* 35.2* 40.5  MCV 95.7 95.7 94.2  MCH 30.4 30.2 29.5  MCHC 31.8 31.5 31.4  RDW 13.9 13.5 13.9  PLT 375 370 481*   Thyroid No results for input(s): TSH, FREET4 in the last 168 hours.  BNP Recent Labs  Lab 09/05/21 1114 09/11/21 0933  BNP 29.7 494.2*    DDimer No results for input(s): DDIMER in the last 168 hours.   Radiology/Studies:  CT Angio Chest PE W and/or Wo Contrast  Result Date: 09/11/2021 CLINICAL DATA:  Pulmonary embolism (PE) suspected, high prob EXAM: CT ANGIOGRAPHY CHEST WITH CONTRAST TECHNIQUE: Multidetector CT imaging of the chest was performed using the standard protocol during bolus administration of intravenous contrast. Multiplanar CT image reconstructions and MIPs were obtained to evaluate the vascular anatomy. RADIATION DOSE REDUCTION: This exam was performed according to the departmental dose-optimization program which includes automated exposure control, adjustment of the mA and/or kV according to patient size and/or use of iterative reconstruction technique. CONTRAST:  69m OMNIPAQUE IOHEXOL 350 MG/ML SOLN COMPARISON:  CT 09/05/2021  FINDINGS: Cardiovascular: Normal cardiac size.No pericardial disease.Normal size main and branch pulmonary arteries.No evidence of pulmonary embolism. Mild atherosclerosis of the aortic arch and descending aorta. Mediastinum/Nodes: No lymphadenopathy.Atrophic left thyroid.Esophagus is unremarkable.The trachea is unremarkable. Lungs/Pleura: There are patchy airspace opacities in the right upper, middle, and lower lobes which have improved since recent CT on 09/05/2021. Unchanged benign 4 mm solid nodule in the lingula (series 6, image 57) and 3 mm nodule in the superior segment of the left lower lobe (series 6, image 41), stable since October 2018. No other suspicious nodules. No pleural effusion. No pneumothorax. Unchanged partially calcified pleural chromic wall thickening in the right apex. Upper Abdomen: No acute abnormality. There are bilateral renal cysts, density measuring as simple cysts. Musculoskeletal: There is  a spinal cord stimulator with lead terminating at the level of T7. No acute osseous abnormality. No suspicious lytic or blastic lesions. Multilevel degenerative changes of the spine.No suspicious lytic or blastic lesions. Prior right mastectomy. Review of the MIP images confirms the above findings. IMPRESSION: Multifocal pneumonia, with slight improvement in the patchy bilateral airspace opacities since recent CT on 09/05/2021. Recommend follow-up chest CT in 3 months after treatment to ensure resolution. No evidence of pulmonary embolism. Aortic Atherosclerosis (ICD10-I70.0). Electronically Signed   By: Maurine Simmering M.D.   On: 09/11/2021 11:26   DG Chest Portable 1 View  Result Date: 09/11/2021 CLINICAL DATA:  Shortness of breath.  No chest pain. EXAM: PORTABLE CHEST 1 VIEW COMPARISON:  09/05/2021 FINDINGS: Bilateral interstitial thickening concerning for pneumonia including atypical viral pneumonia. No focal consolidation. No pleural effusion or pneumothorax. Heart and mediastinal contours are  unremarkable. No acute osseous abnormality. IMPRESSION: 1. Bilateral interstitial thickening concerning for pneumonia including atypical viral pneumonia. Electronically Signed   By: Kathreen Devoid M.D.   On: 09/11/2021 10:01     Assessment and Plan:   New onset atrial fibrillation with RVR -BP low with systolics in the 99B. -Likely driven by underlying multifocal pneumonia -Continue amiodarone drip, heparin drip -Avoid Cardizem due to low blood pressures and LV dysfunction. -Creatinine normal -Dig load patient with IV dig 0.5 mg x 1, 0.25 mg x1 after 4 hours. -Check dig levels in the a.m.  2.  Cardiomyopathy, EF 25% Appears euvolemic. -Etiology includes tachycardia mediated, history of stress-induced cardiomyopathy -Left heart cath 2018 with no evidence for CAD. -GDMT for CHF when BP permits  3.  Multifocal pneumonia, sepsis, hypotension -Pressors as per ICU team -Respiratory support, IV antibiotics as per ICU team   Total encounter time more than 110 minutes  Greater than 50% was spent in counseling and coordination of care with the patient   Signed, Kate Sable, MD  09/11/2021 6:45 PM

## 2021-09-11 NOTE — ED Notes (Signed)
Report received from Jessica, RN.

## 2021-09-11 NOTE — Telephone Encounter (Signed)
Reason for Disposition  Sounds like a life-threatening emergency to the triager  Answer Assessment - Initial Assessment Questions 1. SYMPTOMS: "What symptoms are you concerned about?"     I've been throwing up all night.   I can't keep anything.   My glucose is 95.  I can't eat.  It's dropping my glucose fast because I can't eat.    I'm shaking and sweaty, nervous.  If I eat I'm going to vomit.   Nothing is staying down.   What should I do?  I instructed her to call 911 since she is symptomatic and her glucose is dropping and unable to get anything in her to elevate her glucose.   Pt was agreeable and is going to call 911. 2. ONSET:  "When did the symptoms start?"     This morning now. 3. BLOOD GLUCOSE: "What is your blood glucose level?"      95 now.   It has dropped over the last 15 minutes a lot.   I'm shaking, nervous, sweating real bad. 4. USUAL RANGE: "What is your blood glucose level usually?" (e.g., usual fasting morning value, usual evening value)     *No Answer* 5. TYPE 1 or 2:  "Do you know what type of diabetes you have?"  (e.g., Type 1, Type 2, Gestational; doesn't know)      *No Answer* 6. INSULIN: "Do you take insulin?" "What type of insulin(s) do you use? What is the mode of delivery? (syringe, pen; injection or pump) "When did you last give yourself an insulin dose?" (i.e., time or hours/minutes ago) "How much did you give?" (i.e., how many units)     *No Answer* 7. DIABETES PILLS: "Do you take any pills for your diabetes?"     *No Answer* 8. OTHER SYMPTOMS: "Do you have any symptoms?" (e.g., fever, frequent urination, difficulty breathing, vomiting)     *No Answer* 9. LOW BLOOD GLUCOSE TREATMENT: "What have you done so far to treat the low blood glucose level?"     Nothing because nothing is staying down.   I can't eat or drink without vomiting.   I've vomited all night 10. FOOD: "When did you last eat or drink?"       *No Answer* 11. ALONE: "Are you alone right now or is  someone with you?"        *No Answer* 12. PREGNANCY: "Is there any chance you are pregnant?" "When was your last menstrual period?"       *No Answer*  Protocols used: Diabetes - Low Blood Sugar-A-AH

## 2021-09-11 NOTE — ED Provider Notes (Signed)
[facility] Provider Note    Event Date/Time   First MD Initiated Contact with Patient 09/11/21 [phone]     (approximate)   History   Code STEMI   HPI  Jordan Chan is a 71 y.o. female presents ER for fatigue shortness of breath some discomfort with deep inspiration after recent hospitalization for pneumonia also has a history of congestive heart failure.  States that she really started feeling bad over the past 2 to 3 days.  She states that she does have a history of PE DVT was on Eliquis for 6 months but not currently on that.  Has not missed any medication doses over the past few days.  Her last dose of antibiotics was on Sunday.     Physical Exam   Triage Vital Signs: ED Triage Vitals  Enc Vitals Group     BP      Pulse      Resp      Temp      Temp src      SpO2      Weight      Height      Head Circumference      Peak Flow      Pain Score      Pain Loc      Pain Edu?      Excl. in [place]?     Most recent vital signs: Vitals:   09/11/21 1247 09/11/21 1251  BP: (!) 89/62 (!) 87/53  Pulse: 97 (!) 145  Resp: 17 14  Temp:    SpO2: 100% 97%     Constitutional: Alert  Eyes: Conjunctivae are normal.  Head: Atraumatic. Nose: No congestion/rhinnorhea. Mouth/Throat: Mucous membranes are moist.   Neck: Painless ROM.  Cardiovascular:   tachycardic, well perfused Respiratory: Normal respiratory effort.  Scattered expiratory wheeze  Gastrointestinal: Soft and nontender.  Musculoskeletal:  no deformity Neurologic:  MAE spontaneously. No gross focal neurologic deficits are appreciated.  Skin:  Skin is warm, dry and intact. No rash noted. Psychiatric: Mood and affect are normal. Speech and behavior are normal.    ED Results / Procedures / Treatments   Labs (all labs ordered are listed, but only abnormal results are displayed) Labs Reviewed  CBC - Abnormal; Notable for the following components:      Result Value   WBC 16.4 (*)     Platelets 481 (*)    All other components within normal limits  COMPREHENSIVE METABOLIC PANEL - Abnormal; Notable for the following components:   Potassium 3.4 (*)    Glucose, Bld 122 (*)    Calcium 8.3 (*)    Albumin 3.2 (*)    All other components within normal limits  BRAIN NATRIURETIC PEPTIDE - Abnormal; Notable for the following components:   B Natriuretic Peptide 494.2 (*)    All other components within normal limits  CULTURE, BLOOD (ROUTINE X 2)  CULTURE, BLOOD (ROUTINE X 2)  MRSA NEXT GEN BY PCR, NASAL  RESP PANEL BY RT-PCR (FLU A&B, COVID) ARPGX2  PROTIME-INR  LACTIC ACID, PLASMA  TROPONIN I (HIGH SENSITIVITY)  TROPONIN I (HIGH SENSITIVITY)     EKG  ED ECG REPORT I, Merlyn Lot, the attending physician, personally viewed and interpreted this ECG.   Date: 09/11/2021  EKG Time: 9:33  Rate: 155  Rhythm: afib w rvr vs aflutter  Axis: normal  Intervals: normal  ST&T Change: nonspecific st abn, no stemi    RADIOLOGY Please see ED  Course for my review and interpretation.  I personally reviewed all radiographic images ordered to evaluate for the above acute complaints and reviewed radiology reports and findings.  These findings were personally discussed with the patient.  Please see medical record for radiology report.    PROCEDURES:  Critical Care performed: Yes, see critical care procedure note(s)  .Critical Care Performed by: Merlyn Lot, MD Authorized by: Merlyn Lot, MD   Critical care provider statement:    Critical care time (minutes):  40   Critical care was necessary to treat or prevent imminent or life-threatening deterioration of the following conditions:  Circulatory failure   Critical care was time spent personally by me on the following activities:  Ordering and performing treatments and interventions, ordering and review of laboratory studies, ordering and review of radiographic studies, pulse oximetry, re-evaluation of patient's  condition, review of old charts, obtaining history from patient or surrogate, examination of patient, evaluation of patient's response to treatment, discussions with primary provider, discussions with consultants and development of treatment plan with patient or surrogate   MEDICATIONS ORDERED IN ED: Medications  diltiazem (CARDIZEM) 1 mg/mL load via infusion 5 mg (5 mg Intravenous Bolus from Bag 09/11/21 1145)    And  diltiazem (CARDIZEM) 125 mg in dextrose 5% 125 mL (1 mg/mL) infusion (10 mg/hr Intravenous Rate/Dose Change 09/11/21 1216)  lactated ringers infusion (has no administration in time range)  azithromycin (ZITHROMAX) 500 mg in sodium chloride 0.9 % 250 mL IVPB (has no administration in time range)  vancomycin (VANCOREADY) IVPB 1500 mg/300 mL (has no administration in time range)  metoprolol succinate (TOPROL-XL) 24 hr tablet 12.5 mg (has no administration in time range)  sodium chloride 0.9 % bolus 1,000 mL (has no administration in time range)  potassium chloride SA (KLOR-CON M) CR tablet 40 mEq (has no administration in time range)  ipratropium-albuterol (DUONEB) 0.5-2.5 (3) MG/3ML nebulizer solution 3 mL (3 mLs Nebulization Given 09/11/21 1030)  sodium chloride 0.9 % bolus 1,000 mL (0 mLs Intravenous Stopped 09/11/21 1137)  iohexol (OMNIPAQUE) 350 MG/ML injection 75 mL (75 mLs Intravenous Contrast Given 09/11/21 1104)  ondansetron (ZOFRAN) 4 MG/2ML injection (4 mg  Given 09/11/21 1150)  ceFEPIme (MAXIPIME) 2 g in sodium chloride 0.9 % 100 mL IVPB (0 g Intravenous Stopped 09/11/21 1255)  metoprolol tartrate (LOPRESSOR) injection 5 mg (5 mg Intravenous Given 09/11/21 1223)     IMPRESSION / MDM / [place] / ED COURSE  I reviewed the triage vital signs and the nursing notes.                              Differential diagnosis includes, but is not limited to, Asthma, copd, CHF, pna, ptx, malignancy, Pe, anemia   Patient presenting with symptoms as described above.  She is  tachycardic soft blood pressure but mentating well protecting her airway appears well perfused.  The patient will be placed on continuous pulse oximetry and telemetry for monitoring.  Laboratory evaluation will be sent to evaluate for the above complaints.      Clinical Course as of 09/11/21 1329  Wed Sep 11, 2021  0928 Dr. Corky Sox of cardiology at bedside.  Discussed in consultation.  After reviewing EKG does not seem consistent with STEMI.  She is just here for pneumonia and COPD exacerbation her primary complaints are nausea vomiting and shortness of breath.  She is protecting her airway. [PR]  1000 Chest x-ray my review  does not show any evidence of pneumothorax. [PR]  1025 BNP is mildly elevated patient's blood pressure did drop however she is not currently hypoxic will order CTA given history of PE. [PR]  1108 The imaging by my review does not show any evidence of PE no dissection. [PR]  72 CT does show multifocal pneumonia.  Will give antibiotics after cultures given her leukocytosis.  She is on 2 L currently.  Does appear to be in A-fib with RVR placed her on a Cardizem infusion. [PR]  1221 Patient persistently tachycardic.  Lactate normal.  Will trial dose of Lopressor as she is on metoprolol at home. [PR]  1329 Patient's blood pressure soft but she is mentating well.  We will switch to amiodarone and discontinue Cardizem.  I have consulted with hospitalist who has evaluated patient and given her low blood pressure is requesting ICU admission and consultation.  Will consult ICU. [PR]    Clinical Course User Index [PR] Merlyn Lot, MD     FINAL CLINICAL IMPRESSION(S) / ED DIAGNOSES   Final diagnoses:  Atrial fibrillation with RVR ([place])  Dyspnea, unspecified type     Rx / DC Orders   ED Discharge Orders     None        Note:  This document was prepared using Dragon voice recognition software and may include unintentional dictation errors.    Merlyn Lot,  MD 09/11/21 1308

## 2021-09-11 NOTE — Consult Note (Signed)
PHARMACY -  BRIEF ANTIBIOTIC NOTE   Pharmacy has received consult(s) for cefepime and vancomycin from an ED provider.  The patient's profile has been reviewed for ht/wt/allergies/indication/available labs.    One time order(s) placed for cefepime 2 g and vancomycin 1500 mg IV   Further antibiotics/pharmacy consults should be ordered by admitting physician if indicated.                       Thank you, Darnelle Bos, PharmD 09/11/2021  12:21 PM

## 2021-09-11 NOTE — ED Notes (Signed)
Cbg 237

## 2021-09-11 NOTE — Telephone Encounter (Signed)
°  Chief Complaint: Glucose is 95, vomiting everything she tries to eat and drink Symptoms: shaking, sweating, nervous, talking fast, asking what should she do.   Has an appt at 11:00 this morning. Frequency: Now Pertinent Negatives: Patient denies Being able to keep anything food or drink wise down  Disposition: [x] ED /[] Urgent Care (no appt availability in office) / [] Appointment(In office/virtual)/ []  [facility]/ [] Home Care/ [] Refused Recommended Disposition /[] [facility]/ []  Follow-up with PCP Additional Notes: I instructed pt to call 911 which she was agreeable to doing.   I cancelled her 11:00 appt for this morning.

## 2021-09-11 NOTE — Progress Notes (Signed)
[place] for heparin Indication: atrial fibrillation  Allergies  Allergen Reactions   Other Palpitations    IV steroids   Pain Patch [Menthol] Anaphylaxis   Avelox [Moxifloxacin Hcl In Nacl] Other (See Comments)    Upset stomach   Doxycycline Diarrhea   Erythromycin Nausea Only and Other (See Comments)    Can take a Z-Pak just fine Other reaction(s): Other (See Comments) Can take Z-Pak  Can take a Z-Pak just fine   Fentanyl Nausea Only and Rash   Moxifloxacin Hcl Other (See Comments)    Upset stomach Upset stomach   Oxycontin [Oxycodone] Hives   Ozempic [Semaglutide] Nausea Only    Patient Measurements:   Heparin Dosing Weight: 59.6 kg  Vital Signs: Temp: 98.7 F (37.1 C) (02/08 0947) Temp Source: Oral (02/08 0947) BP: 85/69 (02/08 1615) Pulse Rate: 134 (02/08 1615)  Labs: Recent Labs    09/11/21 0933 09/11/21 1135  HGB 12.7  --   HCT 40.5  --   PLT 481*  --   LABPROT 13.2  --   INR 1.0  --   CREATININE 0.91  --   TROPONINIHS 16 16    Estimated Creatinine Clearance: 46.1 mL/min (by C-G formula based on SCr of 0.91 mg/dL).   Medical History: Past Medical History:  Diagnosis Date   Arthritis    Asthma    Breast cancer ([place]) 1998   right breast ca with mastectomy and chemotherapy and radiation   Bronchitis    CHF (congestive heart failure) ([place])    "with Morphine withdrawal"   COPD (chronic obstructive pulmonary disease) ([place])    Diabetes mellitus without complication ([place])    Diverticulitis    diverticulosis also   Dyspnea    Endometriosis    GERD (gastroesophageal reflux disease)    History of shingles 2000-2005   Hypercholesteremia    Hypertension    IBS (irritable bowel syndrome)    Low back pain    a. Implanted morphine/bupivicaine/clonidine pump.   Neuropathy    Orthopnea    Oxygen dependent    uses at night   Personal history of chemotherapy    Personal history of radiation therapy     Pneumonia    pneumonia 5-6 times, history of bronchitis also   Scoliosis    Sleep apnea    does not use cpap   Stroke ([place]) 2010   TIA, 10 years ago   Withdrawal from sedative drug ([place])    withdrawal from morphine when pump batteries died      Assessment: 71 year old female with respiratory failure s/t pneumonia and COPD exacerbation. Patient with atrial fibrillation requiring amiodarone infusion. No history of anticoagulation PTA. Pharmacy consulted for heparin management.  Goal of Therapy:  Heparin level 0.3-0.7 units/ml Monitor platelets by anticoagulation protocol: Yes   Plan:  --Heparin 3600 unit bolus --Heparin infusion at 850 units/hr --Check HL 2/9 at 0000 --CBC daily while on heparin infusion  Tawnya Crook, PharmD, BCPS Clinical Pharmacist 09/11/2021 5:23 PM

## 2021-09-11 NOTE — Progress Notes (Signed)
°  Chaplain On-Call responded to Code STEMI notification at 0916 hours.  Patient was alert and interacting with the medical team; not available for Chaplain visit.  ED Staff will contact Chaplain when patient is available, and/or family arrives.  Code STEMI was cancelled at 0933 hours.  Chaplain Remo Lipps., [facility]

## 2021-09-11 NOTE — H&P (Signed)
NAME:  Jordan Chan, MRN:  [id number], DOB:  Jun 02, 1951, LOS: 0 ADMISSION DATE:  09/11/2021, CONSULTATION DATE:  09/11/2021 REFERRING MD:  Dr. Quentin Cornwall, CHIEF COMPLAINT:  Shortness of Breath, generalized malaise/fatigue, nausea/vomiting  Brief Pt Description / Synopsis:  71 year old female admitted with severe sepsis and Acute on Chronic Hypoxic Respiratory Failure in the setting of multifocal pneumonia & AECOPD, along with atrial fibrillation with RVR requiring amiodarone infusion.  Cardiology consulted.  History of Present Illness:  Jordan Chan is a 71 year old female with a past medical history significant for COPD requiring 2 L supplemental O2 at nighttime, HFrEF (last EF 45%) hypertension, hyperlipidemia, VTE, diabetes mellitus, breast cancer who presented to [facility] ED on 09/11/2021 due to complaints of progressive shortness of breath and generalized malaise/fatigue, pleuritic chest pain, and nausea/vomiting.  Of note she was recently hospitalized from 09/05/2021 through 09/08/2021 for treatment of sepsis  and Acute on Chronic Hypoxic Respiratory Failure due to community-acquired pneumonia & Metapneumovirus, along with COPD exacerbation.  She completed a course of levofloxacin and steroids.  She also reports she has been compliant with all of her home medications including the Lasix.  She reports that she has continued to feel lousy since discharge from the hospital.  Over the past 2 to 3 days she has developed progressive shortness of breath, generalized malaise/fatigue, and nausea/vomiting.  She denied chest pain, sputum production, fever, chills, abdominal pain, hematemesis or melena, dysuria, edema, or any known sick contacts.  ED Course:  Initial vital signs: Temperature 98.7 F orally, respiratory rate 18, pulse 154, blood pressure 92/66, SPO2 91% Significant Labs: Potassium 3.4, glucose 122, BUN 21, creatinine 0.91, albumin 3.2, BNP 494, high-sensitivity troponin 16, lactic acid 1.2,  procalcitonin less than 0.1, WBC 16.4, platelets 481 COVID-19 and influenza PCR negative Urinalysis negative for UTI EKG: A. Fib w/ RVR, normal axis and intervals, nonspecific ST abnormalities, no STEMI Imaging: CTA Chest>> Multifocal pneumonia, with slight improvement in the patchy bilateral airspace opacities since recent CT on 09/05/2021. Recommend follow-up chest CT in 3 months after treatment to ensure resolution. No evidence of pulmonary embolism. Aortic Atherosclerosis Medication administered: IV azithromycin, cefepime, vancomycin.  2 L normal saline boluses, Cardizem 5 mg bolus and Cardizem infusion  She met sepsis criteria therefore she received broad-spectrum antibiotics, 2 L of normal saline boluses, and septic work-up.  EKG was concerning for atrial fibrillation with RVR but she was given 5 mg of IV Cardizem along with Cardizem infusion.  Blood pressure was soft, therefore she was given amiodarone bolus and transition to amiodarone infusion.  PCCM was asked to admit the patient.   Pertinent  Medical History  COPD on 2 L supplemental O2 at bedtime HFrEF (EF 45%) Hypertension Hyperlipidemia VTE (completed Eliquis) Diabetes mellitus Breast cancer status postmastectomy and chemotherapy Back pain Diverticulitis GERD  Micro Data:  09/11/2021: SARS-CoV-2 and influenza PCR>> negative 09/11/2021: Respiratory viral panel>> 09/11/2021: Blood cultures x2>> 09/11/2021: Strep pneumo urinary antigen>> 09/11/2021: Legionella urinary antigen>> 09/11/2021: Sputum>>   Antimicrobials:  Vancomycin x1 dose 2/8 Azithromycin 2/8>> Cefepime 2/8>>  Significant Hospital Events: Including procedures, antibiotic start and stop dates in addition to other pertinent events   2/8: Presented to ED.  Soft blood pressures.  PCCM consulted for admission.  Cardiology consulted for A-fib with RVR requiring amiodarone drip  Interim History / Subjective:  -Patient presents to ED as she has felt lousy since  discharge on 2/5 -Reports progressive fatigue, shortness of breath, malaise with nausea/vomiting -Developed atrial fibrillation with RVR in  the ED, initially given Cardizem which dropped blood pressure, switch to amiodarone drip -PCCM contacted for admission due to soft blood pressure -Cardiology consulted  Objective   Blood pressure 91/67, pulse (!) 137, temperature 98.7 F (37.1 C), temperature source Oral, resp. rate (!) 23, SpO2 99 %.        Intake/Output Summary (Last 24 hours) at 09/11/2021 1517 Last data filed at 09/11/2021 1255 Gross per 24 hour  Intake 1100 ml  Output --  Net 1100 ml   There were no vitals filed for this visit.  Examination: General: Acute on chronically ill-appearing female, laying in bed, on 2 L nasal cannula, no acute distress HENT: Atraumatic, normocephalic, neck supple, no JVD Lungs: Coarse breath sounds bilaterally with mild expiratory wheezing, even, nonlabored Cardiovascular: Tachycardic, irregular regular rhythm, no murmurs, rubs, gallop Abdomen: Soft, nontender, nondistended, no guarding rebound tenderness, bowel sounds positive x4 Extremities: Normal bulk and tone, no deformities, no edema Neuro: Awake, alert and oriented x4, follows commands, no focal deficits, speech clear, pupils PERRLA GU: External female catheter in place  Resolved Hospital Problem list     Assessment & Plan:   Multifactorial shock: Septic +/- cardiogenic +/- hypovolemic (Hx N/V) Atrial fibrillation with RVR PMHx: HFrEF, Hypertension, DVT  -Continuous cardiac monitoring -Maintain MAP >65 -Cautious IV fluids (received 2L NS bolus in ED which fulfills 30 cc/kg resuscitation requirements) -Vasopressors as needed to maintain MAP goal -Trend lactic acid until normalized (1.2) -HS Troponin negative x2 ( 16 ~ 16) -BNP 494 on admission -Echocardiogram pending -Continue Amiodarone drip -CHA2DS2-VASc 2 score is 7 (9.6% risk of stroke annually), HAS-BLED Score 2 (1.88%  bleeding rate /yr) ~ will place on Heparin gtt for anticoagulation for A-fib. -Cardiology consulted, appreciate input  Severe Sepsis in setting of Multifocal Pneumonia PMHx: Recent hospitalization from 2/5 - 2/5 for CAP & Metapneumovirus viral pneumonia -Monitor fever curve -Trend WBC's & Procalcitonin -Follow cultures as above -Continue empiric Azithromycin & Cefepime pending cultures & sensitivities  Acute on Chronic Hypoxic Respiratory Failure in the setting of Multifocal Pneumonia & AECOPD PMHx: COPD requiring 2L supplemental O2 as night -Supplemental O2 as needed to maintain O2 sats 88 to 92% -Follow intermittent chest x-ray and ABG as needed -CTA chest 2/8: Negative for PE, concerning for multifocal pneumonia -Antibiotics as above -Bronchodilators and Pulmicort nebs -IV steroids (Solu-Medrol 40 mg daily) -Ensure pulmonary hygiene  Mild Hypokalemia PMHx: CKD III -Monitor I&O's / urinary output -Follow BMP -Ensure adequate renal perfusion -Avoid nephrotoxic agents as able -Replace electrolytes as indicated -Pharmacy following for assistance with electrolyte replacement  Diabetes mellitus -CBG's ac & hs; Target range of 140 to 180 -SSI -Follow ICU Hypo/Hyperglycemia protocol -Hold home metformin      Best Practice (right click and "Reselect all SmartList Selections" daily)   Diet/type: Regular consistency (see orders) DVT prophylaxis: Heparin gtt (A. Fib) GI prophylaxis: N/A Lines: N/A Foley:  N/A Code Status:  full code Last date of multidisciplinary goals of care discussion [N/A]  Updated pt's spouse and daughter at bedside 09/11/21.  All questions answered to their satisfaction.  Labs   CBC: Recent Labs  Lab 09/05/21 1114 09/06/21 0638 09/08/21 0427 09/11/21 0933  WBC 21.3* 19.3* 15.5* 16.4*  NEUTROABS 15.4*  --   --   --   HGB 12.6 10.7* 11.1* 12.7  HCT 39.1 33.7* 35.2* 40.5  MCV 95.4 95.7 95.7 94.2  PLT 428* 375 370 481*    Basic Metabolic  Panel: Recent Labs  Lab 09/05/21 1114 09/06/21  9242 09/07/21 0554 09/08/21 0427 09/11/21 0933  NA 132* 136 138 135 136  K 3.2* 4.2 4.5 3.7 3.4*  CL 91* 100 99 94* 98  CO2 29 27 32 28 29  GLUCOSE 287* 198* 151* 237* 122*  BUN _0 CREATININE 0.89 0.80 0.72 0.92 0.91  CALCIUM 8.5* 8.2* 8.9 9.0 8.3*   GFR: Estimated Creatinine Clearance: 46.1 mL/min (by C-G formula based on SCr of 0.91 mg/dL). Recent Labs  Lab 09/05/21 1114 09/05/21 1149 09/05/21 1455 09/06/21 0638 09/08/21 0427 09/11/21 0933 09/11/21 1130  PROCALCITON  --   --   --  0.20  --   --   --   WBC 21.3*  --   --  19.3* 15.5* 16.4*  --   LATICACIDVEN  --  2.2* 1.2  --   --   --  1.2    Liver Function Tests: Recent Labs  Lab 09/05/21 1114 09/11/21 0933  AST 19 19  ALT 12 16  ALKPHOS 58 48  BILITOT 0.5 0.6  PROT 8.2* 7.0  ALBUMIN 3.7 3.2*   Recent Labs  Lab 09/05/21 1114  LIPASE 32   No results for input(s): AMMONIA in the last 168 hours.  ABG No results found for: PHART, PCO2ART, PO2ART, HCO3, TCO2, ACIDBASEDEF, O2SAT   Coagulation Profile: Recent Labs  Lab 09/06/21 0638 09/11/21 0933  INR 1.2 1.0    Cardiac Enzymes: No results for input(s): CKTOTAL, CKMB, CKMBINDEX, TROPONINI in the last 168 hours.  HbA1C: Hemoglobin A1C  Date/Time Value Ref Range Status  04/28/2016 12:00 AM 7.7%  Final   HB A1C (BAYER DCA - WAIVED)  Date/Time Value Ref Range Status  07/31/2021 08:57 AM 8.0 (H) 4.8 - 5.6 % Final    Comment:             Prediabetes: 5.7 - 6.4          Diabetes: >6.4          Glycemic control for adults with diabetes: <7.0   04/29/2021 09:42 AM 8.8 (H) 4.8 - 5.6 % Final    Comment:             Prediabetes: 5.7 - 6.4          Diabetes: >6.4          Glycemic control for adults with diabetes: <7.0               **Please note reference interval change**    Hgb A1c MFr Bld  Date/Time Value Ref Range Status  09/05/2021 02:55 PM 8.0 (H) 4.8 - 5.6 % Final    Comment:     (NOTE)         Prediabetes: 5.7 - 6.4         Diabetes: >6.4         Glycemic control for adults with diabetes: <7.0     CBG: Recent Labs  Lab 09/07/21 0812 09/07/21 1129 09/07/21 1628 09/07/21 2113 09/08/21 0748  GLUCAP 164* 223* 340* 307* 182*    Review of Systems:   Positives in BOLD: Gen: Denies fever, chills, weight change, fatigue, night sweats HEENT: Denies blurred vision, double vision, hearing loss, tinnitus, sinus congestion, rhinorrhea, sore throat, neck stiffness, dysphagia PULM: Denies shortness of breath, cough, sputum production, hemoptysis, wheezing CV: Denies chest pain, edema, orthopnea, paroxysmal nocturnal dyspnea, palpitations GI: Denies abdominal pain, nausea, vomiting, diarrhea, hematochezia, melena, constipation, change in bowel habits GU: Denies dysuria, hematuria, polyuria, oliguria, urethral  discharge Endocrine: Denies hot or cold intolerance, polyuria, polyphagia or appetite change Derm: Denies rash, dry skin, scaling or peeling skin change Heme: Denies easy bruising, bleeding, bleeding gums Neuro: Denies headache, numbness, weakness, slurred speech, loss of memory or consciousness   Past Medical History:  She,  has a past medical history of Arthritis, Asthma, Breast cancer ([place]) (1998), Bronchitis, CHF (congestive heart failure) ([place]), COPD (chronic obstructive pulmonary disease) ([place]), Diabetes mellitus without complication ([place]), Diverticulitis, Dyspnea, Endometriosis, GERD (gastroesophageal reflux disease), History of shingles (2000-2005), Hypercholesteremia, Hypertension, IBS (irritable bowel syndrome), Low back pain, Neuropathy, Orthopnea, Oxygen dependent, Personal history of chemotherapy, Personal history of radiation therapy, Pneumonia, Scoliosis, Sleep apnea, Stroke ([place]) (2010), and Withdrawal from sedative drug ([place]).   Surgical History:   Past Surgical History:  Procedure Laterality Date   ABDOMINAL HYSTERECTOMY  1987   BACK SURGERY      Tailbone removed following fracture   BREAST SURGERY Right    mastectomy   CARDIAC CATHETERIZATION     CATARACT EXTRACTION W/PHACO Right 05/19/2019   Procedure: CATARACT EXTRACTION PHACO AND INTRAOCULAR LENS PLACEMENT ([place]), RIGHT, DIABETIC;  Surgeon: Marchia Meiers, MD;  Location: [facility] ORS;  Service: Ophthalmology;  Laterality: Right;  Lot # [id number] H [place]: 00:43.8 CDE: 4.59   CATARACT EXTRACTION W/PHACO Left 06/16/2019   Procedure: CATARACT EXTRACTION PHACO AND INTRAOCULAR LENS PLACEMENT ([place]) LEFT VISION BLUE DIABETIC;  Surgeon: Marchia Meiers, MD;  Location: [facility] ORS;  Service: Ophthalmology;  Laterality: Left;  Lot #[id number] H [place]: 00:46.9 CDE: 6.53   COCCYX REMOVAL     COLONOSCOPY WITH PROPOFOL N/A 01/01/2017   Procedure: COLONOSCOPY WITH PROPOFOL;  Surgeon: Jonathon Bellows, MD;  Location: [facility] ENDOSCOPY;  Service: Endoscopy;  Laterality: N/A;   ELBOW ARTHROSCOPY WITH TENDON RECONSTRUCTION     ESOPHAGOGASTRODUODENOSCOPY (EGD) WITH PROPOFOL N/A 01/01/2017   Procedure: ESOPHAGOGASTRODUODENOSCOPY (EGD) WITH PROPOFOL;  Surgeon: Jonathon Bellows, MD;  Location: [facility] ENDOSCOPY;  Service: Endoscopy;  Laterality: N/A;   INTRATHECAL PUMP IMPLANT     LEFT HEART CATH AND CORONARY ANGIOGRAPHY N/A 04/28/2017   Procedure: LEFT HEART CATH AND CORONARY ANGIOGRAPHY;  Surgeon: [name], MD;  Location: [place] CV LAB;  Service: Cardiovascular;  Laterality: N/A;   MASTECTOMY Right 06/1997   morphine pump  2011   PLANTAR FASCIA RELEASE     TRIGGER FINGER RELEASE       Social History:   reports that she quit smoking about 17 years ago. Her smoking use included cigarettes. She has a 30.00 pack-year smoking history. She has never used smokeless tobacco. She reports that she does not drink alcohol and does not use drugs.   Family History:  Her family history includes Arthritis in her paternal grandfather; Cancer in her paternal grandmother and sister; Cancer (age of onset: 64) in her father; Cancer (age  of onset: 50) in her mother; Hyperlipidemia in her sister and son; Hypertension in her sister; Osteoporosis in her maternal grandmother; Seizures in her son; Thyroid disease in her mother. There is no history of Breast cancer.   Allergies Allergies  Allergen Reactions   Other Palpitations    IV steroids   Pain Patch [Menthol] Anaphylaxis   Avelox [Moxifloxacin Hcl In Nacl] Other (See Comments)    Upset stomach   Doxycycline Diarrhea   Erythromycin Nausea Only and Other (See Comments)    Can take a Z-Pak just fine Other reaction(s): Other (See Comments) Can take Z-Pak  Can take a Z-Pak just fine   Fentanyl Nausea Only and Rash   Moxifloxacin  Hcl Other (See Comments)    Upset stomach Upset stomach   Oxycontin [Oxycodone] Hives   Ozempic [Semaglutide] Nausea Only     Home Medications  Prior to Admission medications   Medication Sig Start Date End Date Taking? Authorizing Provider  atorvastatin (LIPITOR) 20 MG tablet Take 1 tablet (20 mg total) by mouth daily. 04/29/21  Yes [name], Jolene T, NP  cholecalciferol (VITAMIN D3) 25 MCG (1000 UNIT) tablet Take 1,000 Units by mouth daily.   Yes [provider]  Cyanocobalamin 1000 MCG/ML KIT Inject 1,000 mcg as directed every 30 (thirty) days.   Yes [provider]  cyclobenzaprine (FLEXERIL) 5 MG tablet Take 5 mg by mouth 2 (two) times daily as needed for muscle spasms. Takes very rarely   Yes [provider]  diclofenac (VOLTAREN) 50 MG EC tablet TAKE 1 TABLET THREE TIMES DAILY 08/30/21  Yes [name], Jolene T, NP  Dulaglutide (TRULICITY) 4.5 MB/8.6LJ SOPN Inject 4.5 mg as directed once a week. Patient taking differently: Inject 4.5 mg as directed once a week. Tuesday 07/20/21  Yes [name], Henrine Screws T, NP  folic acid (FOLVITE) 1 MG tablet TAKE 1 TABLET EVERY DAY 02/08/21  Yes Sindy Guadeloupe, MD  furosemide (LASIX) 40 MG tablet TAKE 1 TABLET EVERY DAY 07/15/21  Yes End, Harrell Gave, MD  gabapentin (NEURONTIN) 800 MG  tablet Take 1 tablet (800 mg total) by mouth 3 (three) times daily. 10/25/20  Yes [name], Henrine Screws T, NP  metFORMIN (GLUCOPHAGE) 1000 MG tablet Take 1 tablet (1,000 mg total) by mouth 2 (two) times daily with a meal. 10/25/20  Yes [name], Jolene T, NP  metoprolol succinate (TOPROL-XL) 25 MG 24 hr tablet TAKE 1/2 TABLET EVERY DAY Patient taking differently: Take 12.5 mg by mouth daily. 05/13/21  Yes Dunn, Ryan M, PA-C  montelukast (SINGULAIR) 10 MG tablet Take 10 mg by mouth at bedtime.   Yes [provider]  predniSONE (DELTASONE) 20 MG tablet Take 2 tablets (40 mg total) by mouth daily with breakfast. 09/09/21  Yes Wouk, Ailene Rud, MD  rOPINIRole (REQUIP) 0.25 MG tablet Take 1 tablet (0.25 mg total) by mouth at bedtime. 08/14/21  Yes [name], Jolene T, NP  TRELEGY ELLIPTA 100-62.5-25 MCG/INH AEPB INHALE 1 PUFF INTO THE LUNGS DAILY. 12/03/20  Yes [name], Jolene T, NP  TRESIBA FLEXTOUCH 100 UNIT/ML FlexTouch Pen INJECT 30 UNITS INTO THE SKIN AT BEDTIME Patient taking differently: Inject 50 Units into the skin at bedtime. 05/17/21  Yes [name], Jolene T, NP  venlafaxine XR (EFFEXOR-XR) 150 MG 24 hr capsule Take 1 capsule (150 mg total) by mouth daily. 10/22/20  Yes [name], Jolene T, NP  ACCU-CHEK AVIVA PLUS test strip TEST THREE TIMES DAILY 05/11/21   [name], Henrine Screws T, NP  Accu-Chek Softclix Lancets lancets TEST BLOOD SUGAR THREE TIMES DAILY 01/30/21   [name], Henrine Screws T, NP  albuterol (PROVENTIL HFA;VENTOLIN HFA) 108 (90 BASE) MCG/ACT inhaler Inhale 2 puffs into the lungs every 6 (six) hours as needed for wheezing or shortness of breath.     [provider]  Alcohol Swabs (B-D SINGLE USE SWABS REGULAR) PADS USE TWICE DAILY  WITH  SUGAR  CHECKS 01/16/21   [name], Jolene T, NP  Blood Glucose Monitoring Suppl (TRUE METRIX METER) w/Device KIT Use to check blood sugar 4 times a day 05/14/21   [name], Henrine Screws T, NP  lactulose (CHRONULAC) 10 GM/15ML solution Take 45 mLs (30 g total) by mouth  daily as needed for mild constipation or severe constipation. 12/20/19  Loletha Grayer, MD  PAIN MANAGEMENT INTRATHECAL, IT, PUMP 1 each by Intrathecal route. Intrathecal (IT) medication:  Morphine Patient does not remember current. Adjusted every 2.5 months at [facility] in [place].    [provider]     Critical care time: 55 minutes     Darel Hong, AGACNP-BC [facility] epic messenger for cross cover needs If after hours, please call E-link

## 2021-09-11 NOTE — Significant Event (Signed)
Cardiology  STEMI evaluation  I was called to evaluate [name] regarding concern for possible STEMI.  She has a history of stress-induced cardiomyopathy with an EF of 45% most recently, chronic interventricular conduction delay (similar to left bundle branch block), COPD who presents with nausea and weakness, as well as shortness of breath with exertion since her discharge 2 days previously.  At the time of my evaluation she denies any chest pain, and overall just feels weak.  Her EKG shows an atrial tachy arrhythmia, possibly atrial flutter at a rate of 155.  Her QRS is a left bundle Lloyd in appearance with no obvious acute ST elevation.  Overall fairly similar to her previous EKG from 2/23.  I believe she requires further work-up, and will defer emergent heart catheterization at this time.  Gust with the emergency department provider who is in agreement with this plan.  Andrez Grime, MD

## 2021-09-11 NOTE — Progress Notes (Addendum)
I was called for admission by the ED physician.  I reviewed the chart, I was informed that patient had presented with shortness of breath.  She was recently discharged just 3 days ago and was treated for bacterial and viral pneumonia.  She was once again diagnosed with bilateral pneumonia.  She was started on antibiotics.  She was also in atrial fibrillation with RVR and was started on Cardizem drip.  She was also presumed to have ST elevation MI and code STEMI was called and she was evaluated by cardiology and cardiology did not think this was "a STEMI and they decided not to pursue angiography at this point in time.  I went to see the patient, husband and daughter at the bedside, on my evaluation, patient was slightly lethargic but oriented.  She was able to provide the history however patient's blood pressure was significantly low with a MAP of 68.  She has history of systolic heart failure.  The daughter was questioning me why patient is on diltiazem drip as she is not supposed to be on this medication due to her systolic heart failure and this is also dropping her blood pressure.  Due to borderline low blood pressure, patient remains at high risk of requiring vasopressors.  I recommended ED physician to transition her to amiodarone drip for now and consult with ICU doctor as I do believe that patient would be better served in ICU setting at this point in time as she may need vasopressor support at any time.  I spent 20 minutes for this consultation.  Addendum: On my examination, patient had rhonchi bilaterally on the lung exam, she was in atrial fibrillation with RVR with rates around 147 and blood pressure was low at 88/73.  Abdomen soft and nontender.  No peripheral edema.

## 2021-09-11 NOTE — ED Triage Notes (Signed)
Pt comes ems from home as a code stemi. Denies CP. Has some nausea, SOB since last night. Has COPD so some of the SOB is chronic. Also weak. Hx of afib. Aox4.

## 2021-09-11 NOTE — Consult Note (Signed)
Pharmacy Antibiotic Note  Jordan Chan is a 71 y.o. female admitted on 09/11/2021 with  weakness / shortness of breath in setting of recent hospitalization for pneumonia (bacterial and viral) and was positive for metapneumovirus. Patient did receive antibiotic therapy primarily with levofloxacin. CTA interpretation of improvement in airspace disease. Patient also found to be in Afib with RVR.  Pharmacy has been consulted for cefepime dosing. Patient is also ordered azithromycin.   Plan:  Cefepime 2 g IV q12h  Temp (24hrs), Avg:98.7 F (37.1 C), Min:98.7 F (37.1 C), Max:98.7 F (37.1 C)  Recent Labs  Lab 09/05/21 1114 09/05/21 1149 09/05/21 1455 09/06/21 6579 09/07/21 0554 09/08/21 0427 09/11/21 0933 09/11/21 1130  WBC 21.3*  --   --  19.3*  --  15.5* 16.4*  --   CREATININE 0.89  --   --  0.80 0.72 0.92 0.91  --   LATICACIDVEN  --  2.2* 1.2  --   --   --   --  1.2    Estimated Creatinine Clearance: 46.1 mL/min (by C-G formula based on SCr of 0.91 mg/dL).    Allergies  Allergen Reactions   Other Palpitations    IV steroids   Pain Patch [Menthol] Anaphylaxis   Avelox [Moxifloxacin Hcl In Nacl] Other (See Comments)    Upset stomach   Doxycycline Diarrhea   Erythromycin Nausea Only and Other (See Comments)    Can take a Z-Pak just fine Other reaction(s): Other (See Comments) Can take Z-Pak  Can take a Z-Pak just fine   Fentanyl Nausea Only and Rash   Moxifloxacin Hcl Other (See Comments)    Upset stomach Upset stomach   Oxycontin [Oxycodone] Hives   Ozempic [Semaglutide] Nausea Only    Antimicrobials this admission: Azithromycin 2/8 >>  Cefepime 2/8 >>   Dose adjustments this admission: N/A  Microbiology results: 2/8 BCx: pending 2/8 Expanded respiratory panel: pending 2/8 Sputum: pending  2/8 MRSA PCR: pending  Thank you for allowing pharmacy to be a part of this patients care.  [name] 09/11/2021 2:36 PM

## 2021-09-11 NOTE — Progress Notes (Signed)
An USGPIV (ultrasound guided PIV) has been placed for short-term vasopressor infusion. A correctly placed ivWatch must be used when administering Vasopressors. Should this treatment be needed beyond 72 hours, central line access should be obtained.  It will be the responsibility of the bedside nurse to follow best practice to prevent extravasations.   ?

## 2021-09-11 NOTE — ED Notes (Signed)
Pt states she feels a little better with her BP being up. Denies any needs at this time. Dinner tray was delivered.

## 2021-09-11 NOTE — Progress Notes (Signed)
eLink Physician-Brief Progress Note Patient Name: Jordan Chan DOB: 09-Sep-1950 MRN: [id number]   Date of Service  09/11/2021  HPI/Events of Note  Patient with a history of COPD and heart failure with reduced ejection fraction, recently hospitalized with pneumonia, who is hospitalized today with pneumonia, sepsis, and atrial fibrillation with RVR.  eICU Interventions  New Patient Evaluation.        Kerry Kass Shyler Hamill 09/11/2021, 9:31 PM

## 2021-09-12 ENCOUNTER — Other Ambulatory Visit: Payer: Self-pay

## 2021-09-12 ENCOUNTER — Encounter: Payer: Self-pay | Admitting: Pulmonary Disease

## 2021-09-12 DIAGNOSIS — I5022 Chronic systolic (congestive) heart failure: Secondary | ICD-10-CM

## 2021-09-12 DIAGNOSIS — I480 Paroxysmal atrial fibrillation: Secondary | ICD-10-CM

## 2021-09-12 DIAGNOSIS — R0600 Dyspnea, unspecified: Secondary | ICD-10-CM

## 2021-09-12 LAB — DIGOXIN LEVEL: Digoxin Level: 2.4 ng/mL — ABNORMAL HIGH (ref 0.8–2.0)

## 2021-09-12 LAB — BASIC METABOLIC PANEL
Anion gap: 7 (ref 5–15)
BUN: 16 mg/dL (ref 8–23)
CO2: 27 mmol/L (ref 22–32)
Calcium: 7.6 mg/dL — ABNORMAL LOW (ref 8.9–10.3)
Chloride: 101 mmol/L (ref 98–111)
Creatinine, Ser: 0.69 mg/dL (ref 0.44–1.00)
GFR, Estimated: >60 mL/min (ref >60)
Glucose, Bld: 238 mg/dL — ABNORMAL HIGH (ref 70–99)
Potassium: 4.3 mmol/L (ref 3.5–5.1)
Sodium: 135 mmol/L (ref 135–145)

## 2021-09-12 LAB — CBC
HCT: 34.5 % — ABNORMAL LOW (ref 36.0–46.0)
Hemoglobin: 10.8 g/dL — ABNORMAL LOW (ref 12.0–15.0)
MCH: 29.7 pg (ref 26.0–34.0)
MCHC: 31.3 g/dL (ref 30.0–36.0)
MCV: 94.8 fL (ref 80.0–100.0)
Platelets: 390 10*3/uL (ref 150–400)
RBC: 3.64 MIL/uL — ABNORMAL LOW (ref 3.87–5.11)
RDW: 14 % (ref 11.5–15.5)
WBC: 13.8 10*3/uL — ABNORMAL HIGH (ref 4.0–10.5)
nRBC: 0 % (ref 0.0–0.2)

## 2021-09-12 LAB — HEPARIN LEVEL (UNFRACTIONATED)
Heparin Unfractionated: 0.29 IU/mL — ABNORMAL LOW (ref 0.30–0.70)
Heparin Unfractionated: 0.29 IU/mL — ABNORMAL LOW (ref 0.30–0.70)
Heparin Unfractionated: 0.38 IU/mL (ref 0.30–0.70)

## 2021-09-12 LAB — LEGIONELLA PNEUMOPHILA SEROGP 1 UR AG: L. pneumophila Serogp 1 Ur Ag: NEGATIVE

## 2021-09-12 LAB — PHOSPHORUS: Phosphorus: 2.6 mg/dL (ref 2.5–4.6)

## 2021-09-12 LAB — THYROID PANEL WITH TSH
Free Thyroxine Index: 2.9 (ref 1.2–4.9)
T3 Uptake Ratio: 35 % (ref 24–39)
T4, Total: 8.4 ug/dL (ref 4.5–12.0)
TSH: 4.89 u[IU]/mL — ABNORMAL HIGH (ref 0.450–4.500)

## 2021-09-12 LAB — MAGNESIUM: Magnesium: 1.4 mg/dL — ABNORMAL LOW (ref 1.7–2.4)

## 2021-09-12 LAB — GLUCOSE, CAPILLARY
Glucose-Capillary: 212 mg/dL — ABNORMAL HIGH (ref 70–99)
Glucose-Capillary: 224 mg/dL — ABNORMAL HIGH (ref 70–99)
Glucose-Capillary: 265 mg/dL — ABNORMAL HIGH (ref 70–99)
Glucose-Capillary: 250 mg/dL — ABNORMAL HIGH (ref 70–99)

## 2021-09-12 LAB — PROCALCITONIN: Procalcitonin: <0.1 ng/mL

## 2021-09-12 MED ORDER — DEXTROMETHORPHAN POLISTIREX ER 30 MG/5ML PO SUER
15.0000 mg | Freq: Two times a day (BID) | ORAL | Status: DC
  Administered 2021-09-12 – 2021-09-14 (×5): 15 mg via ORAL
  Filled 2021-09-12: qty 5
  Filled 2021-09-12 (×5): qty 2.5

## 2021-09-12 MED ORDER — CEFUROXIME AXETIL 500 MG PO TABS
500.0000 mg | ORAL_TABLET | Freq: Two times a day (BID) | ORAL | Status: DC
  Administered 2021-09-13 – 2021-09-14 (×3): 500 mg via ORAL
  Filled 2021-09-12 (×4): qty 1

## 2021-09-12 MED ORDER — HEPARIN BOLUS VIA INFUSION
800.0000 [IU] | Freq: Once | INTRAVENOUS | Status: AC
  Administered 2021-09-12: 800 [IU] via INTRAVENOUS
  Filled 2021-09-12: qty 800

## 2021-09-12 MED ORDER — INSULIN GLARGINE-YFGN 100 UNIT/ML ~~LOC~~ SOLN
15.0000 [IU] | Freq: Every day | SUBCUTANEOUS | Status: DC
  Administered 2021-09-12 – 2021-09-14 (×3): 15 [IU] via SUBCUTANEOUS
  Filled 2021-09-12 (×4): qty 0.15

## 2021-09-12 MED ORDER — AMIODARONE HCL 200 MG PO TABS
400.0000 mg | ORAL_TABLET | Freq: Two times a day (BID) | ORAL | Status: DC
  Administered 2021-09-12 – 2021-09-14 (×5): 400 mg via ORAL
  Filled 2021-09-12 (×5): qty 2

## 2021-09-12 MED ORDER — AZITHROMYCIN 250 MG PO TABS
500.0000 mg | ORAL_TABLET | Freq: Every day | ORAL | Status: AC
  Administered 2021-09-12 – 2021-09-13 (×2): 500 mg via ORAL
  Filled 2021-09-12: qty 1
  Filled 2021-09-12: qty 2

## 2021-09-12 MED ORDER — MAGNESIUM SULFATE 2 GM/50ML IV SOLN
2.0000 g | Freq: Once | INTRAVENOUS | Status: AC
  Administered 2021-09-12: 2 g via INTRAVENOUS
  Filled 2021-09-12: qty 50

## 2021-09-12 MED ORDER — HEPARIN (PORCINE) 25000 UT/250ML-% IV SOLN
1050.0000 [IU]/h | INTRAVENOUS | Status: DC
  Administered 2021-09-12: 950 [IU]/h via INTRAVENOUS
  Filled 2021-09-12: qty 250

## 2021-09-12 NOTE — Progress Notes (Signed)
NAME:  Jordan Chan, MRN:  [id number], DOB:  1951-03-13, LOS: 1 ADMISSION DATE:  09/11/2021, CONSULTATION DATE:  09/11/2021 REFERRING MD:  Dr. Quentin Cornwall, CHIEF COMPLAINT:  Shortness of Breath, generalized malaise/fatigue, nausea/vomiting  Brief Pt Description / Synopsis:  71 year old female admitted with severe sepsis and Acute on Chronic Hypoxic Respiratory Failure in the setting of multifocal pneumonia & AECOPD, along with atrial fibrillation with RVR requiring amiodarone infusion.  Cardiology consulted.  History of Present Illness:  Jordan Chan is a 71 year old female with a past medical history significant for COPD requiring 2 L supplemental O2 at nighttime, HFrEF (last EF 45%) hypertension, hyperlipidemia, VTE, diabetes mellitus, breast cancer who presented to [facility] ED on 09/11/2021 due to complaints of progressive shortness of breath and generalized malaise/fatigue, pleuritic chest pain, and nausea/vomiting.  Of note she was recently hospitalized from 09/05/2021 through 09/08/2021 for treatment of sepsis  and Acute on Chronic Hypoxic Respiratory Failure due to community-acquired pneumonia & Metapneumovirus, along with COPD exacerbation.  She completed a course of levofloxacin and steroids.  She also reports she has been compliant with all of her home medications including the Lasix.  She reports that she has continued to feel lousy since discharge from the hospital.  Over the past 2 to 3 days she has developed progressive shortness of breath, generalized malaise/fatigue, and nausea/vomiting.  She denied chest pain, sputum production, fever, chills, abdominal pain, hematemesis or melena, dysuria, edema, or any known sick contacts.  09/12/21- patient is more stable today will sign off and transfer to [facility] for treatment of Aecopd with Atrial fibrillation.     Pertinent  Medical History  COPD on 2 L supplemental O2 at bedtime HFrEF (EF 45%) Hypertension Hyperlipidemia VTE (completed  Eliquis) Diabetes mellitus Breast cancer status postmastectomy and chemotherapy Back pain Diverticulitis GERD  Micro Data:  09/11/2021: SARS-CoV-2 and influenza PCR>> negative 09/11/2021: Respiratory viral panel>> 09/11/2021: Blood cultures x2>> 09/11/2021: Strep pneumo urinary antigen>> 09/11/2021: Legionella urinary antigen>> 09/11/2021: Sputum>>   Antimicrobials:  Vancomycin x1 dose 2/8 Azithromycin 2/8>> Cefepime 2/8>>  Significant Hospital Events: Including procedures, antibiotic start and stop dates in addition to other pertinent events   2/8: Presented to ED.  Soft blood pressures.  PCCM consulted for admission.  Cardiology consulted for A-fib with RVR requiring amiodarone drip  Interim History / Subjective:  -Patient presents to ED as she has felt lousy since discharge on 2/5 -Reports progressive fatigue, shortness of breath, malaise with nausea/vomiting -Developed atrial fibrillation with RVR in the ED, initially given Cardizem which dropped blood pressure, switch to amiodarone drip -PCCM contacted for admission due to soft blood pressure -Cardiology consulted  Objective   Blood pressure 105/66, pulse 76, temperature 98 F (36.7 C), temperature source Oral, resp. rate 19, height 4' 10"  (1.473 m), weight 70.5 kg, SpO2 99 %.        Intake/Output Summary (Last 24 hours) at 09/12/2021 0929 Last data filed at 09/12/2021 0847 Gross per 24 hour  Intake 1756.84 ml  Output 950 ml  Net 806.84 ml    Filed Weights   09/11/21 2040 09/12/21 0500  Weight: 69.7 kg 70.5 kg    Examination: General: Acute on chronically ill-appearing female, laying in bed, on 2 L nasal cannula, no acute distress HENT: Atraumatic, normocephalic, neck supple, no JVD Lungs: Coarse breath sounds bilaterally with mild expiratory wheezing, even, nonlabored Cardiovascular: Tachycardic, irregular regular rhythm, no murmurs, rubs, gallop Abdomen: Soft, nontender, nondistended, no guarding rebound tenderness,  bowel sounds positive x4 Extremities: Normal bulk  and tone, no deformities, no edema Neuro: Awake, alert and oriented x4, follows commands, no focal deficits, speech clear, pupils PERRLA GU: External female catheter in place  Resolved Hospital Problem list     Assessment & Plan:   Multifactorial shock: Septic +/- cardiogenic +/- hypovolemic (Hx N/V) Atrial fibrillation with RVR PMHx: HFrEF, Hypertension, DVT  -Continuous cardiac monitoring -Maintain MAP >65 -Cautious IV fluids (received 2L NS bolus in ED which fulfills 30 cc/kg resuscitation requirements) -Vasopressors as needed to maintain MAP goal -Trend lactic acid until normalized (1.2) -HS Troponin negative x2 ( 16 ~ 16) -BNP 494 on admission -Echocardiogram pending -Continue Amiodarone drip -CHA2DS2-VASc 2 score is 7 (9.6% risk of stroke annually), HAS-BLED Score 2 (1.88% bleeding rate /yr) ~ will place on Heparin gtt for anticoagulation for A-fib. -Cardiology consulted, appreciate input  Severe Sepsis in setting of Multifocal Pneumonia PMHx: Recent hospitalization from 2/5 - 2/5 for CAP & Metapneumovirus viral pneumonia -Monitor fever curve -Trend WBC's & Procalcitonin -Follow cultures as above -Continue empiric Azithromycin & Cefepime pending cultures & sensitivities  Acute on Chronic Hypoxic Respiratory Failure in the setting of Multifocal Pneumonia & AECOPD PMHx: COPD requiring 2L supplemental O2 as night -Supplemental O2 as needed to maintain O2 sats 88 to 92% -Follow intermittent chest x-ray and ABG as needed -CTA chest 2/8: Negative for PE, concerning for multifocal pneumonia -Antibiotics as above -Bronchodilators and Pulmicort nebs -IV steroids (Solu-Medrol 40 mg daily) -Ensure pulmonary hygiene  Mild Hypokalemia PMHx: CKD III -Monitor I&O's / urinary output -Follow BMP -Ensure adequate renal perfusion -Avoid nephrotoxic agents as able -Replace electrolytes as indicated -Pharmacy following for assistance  with electrolyte replacement  Diabetes mellitus -CBG's ac & hs; Target range of 140 to 180 -SSI -Follow ICU Hypo/Hyperglycemia protocol -Hold home metformin      Best Practice (right click and "Reselect all SmartList Selections" daily)   Diet/type: Regular consistency (see orders) DVT prophylaxis: Heparin gtt (A. Fib) GI prophylaxis: N/A Lines: N/A Foley:  N/A Code Status:  full code Last date of multidisciplinary goals of care discussion [N/A]  Updated pt's spouse and daughter at bedside 09/11/21.  All questions answered to their satisfaction.  Labs   CBC: Recent Labs  Lab 09/05/21 1114 09/06/21 0638 09/08/21 0427 09/11/21 0933 09/12/21 0430  WBC 21.3* 19.3* 15.5* 16.4* 13.8*  NEUTROABS 15.4*  --   --   --   --   HGB 12.6 10.7* 11.1* 12.7 10.8*  HCT 39.1 33.7* 35.2* 40.5 34.5*  MCV 95.4 95.7 95.7 94.2 94.8  PLT 428* 375 370 481* 390     Basic Metabolic Panel: Recent Labs  Lab 09/06/21 0638 09/07/21 0554 09/08/21 0427 09/11/21 0933 09/12/21 0430  NA 136 138 135 136 135  K 4.2 4.5 3.7 3.4* 4.3  CL 100 99 94* 98 101  CO2 27 32 28 29 27   GLUCOSE 198* 151* 237* 122* 238*  BUN 17 14 16 21 16   CREATININE 0.80 0.72 0.92 0.91 0.69  CALCIUM 8.2* 8.9 9.0 8.3* 7.6*    GFR: Estimated Creatinine Clearance: 53.7 mL/min (by C-G formula based on SCr of 0.69 mg/dL). Recent Labs  Lab 09/05/21 1149 09/05/21 1455 09/06/21 0638 09/08/21 0427 09/11/21 0933 09/11/21 1130 09/11/21 1135 09/12/21 0430  PROCALCITON  --   --  0.20  --   --   --  <0.10 <0.10  WBC  --   --  19.3* 15.5* 16.4*  --   --  13.8*  LATICACIDVEN 2.2* 1.2  --   --   --  1.2  --   --      Liver Function Tests: Recent Labs  Lab 09/05/21 1114 09/11/21 0933  AST 19 19  ALT 12 16  ALKPHOS 58 48  BILITOT 0.5 0.6  PROT 8.2* 7.0  ALBUMIN 3.7 3.2*    Recent Labs  Lab 09/05/21 1114  LIPASE 32    No results for input(s): AMMONIA in the last 168 hours.  ABG No results found for: PHART,  PCO2ART, PO2ART, HCO3, TCO2, ACIDBASEDEF, O2SAT   Coagulation Profile: Recent Labs  Lab 09/06/21 0638 09/11/21 0933  INR 1.2 1.0     Cardiac Enzymes: No results for input(s): CKTOTAL, CKMB, CKMBINDEX, TROPONINI in the last 168 hours.  HbA1C: Hemoglobin A1C  Date/Time Value Ref Range Status  04/28/2016 12:00 AM 7.7%  Final   HB A1C (BAYER DCA - WAIVED)  Date/Time Value Ref Range Status  07/31/2021 08:57 AM 8.0 (H) 4.8 - 5.6 % Final    Comment:             Prediabetes: 5.7 - 6.4          Diabetes: >6.4          Glycemic control for adults with diabetes: <7.0   04/29/2021 09:42 AM 8.8 (H) 4.8 - 5.6 % Final    Comment:             Prediabetes: 5.7 - 6.4          Diabetes: >6.4          Glycemic control for adults with diabetes: <7.0               **Please note reference interval change**    Hgb A1c MFr Bld  Date/Time Value Ref Range Status  09/05/2021 02:55 PM 8.0 (H) 4.8 - 5.6 % Final    Comment:    (NOTE)         Prediabetes: 5.7 - 6.4         Diabetes: >6.4         Glycemic control for adults with diabetes: <7.0     CBG: Recent Labs  Lab 09/07/21 2113 09/08/21 0748 09/11/21 1744 09/11/21 2046 09/12/21 0716  GLUCAP 307* 182* 237* 245* 212*     Review of Systems:   Positives in BOLD: Gen: Denies fever, chills, weight change, fatigue, night sweats HEENT: Denies blurred vision, double vision, hearing loss, tinnitus, sinus congestion, rhinorrhea, sore throat, neck stiffness, dysphagia PULM: Denies shortness of breath, cough, sputum production, hemoptysis, wheezing CV: Denies chest pain, edema, orthopnea, paroxysmal nocturnal dyspnea, palpitations GI: Denies abdominal pain, nausea, vomiting, diarrhea, hematochezia, melena, constipation, change in bowel habits GU: Denies dysuria, hematuria, polyuria, oliguria, urethral discharge Endocrine: Denies hot or cold intolerance, polyuria, polyphagia or appetite change Derm: Denies rash, dry skin, scaling or peeling  skin change Heme: Denies easy bruising, bleeding, bleeding gums Neuro: Denies headache, numbness, weakness, slurred speech, loss of memory or consciousness   Past Medical History:  She,  has a past medical history of Arthritis, Asthma, Breast cancer ([place]) (1998), Bronchitis, CHF (congestive heart failure) ([place]), COPD (chronic obstructive pulmonary disease) ([place]), Diabetes mellitus without complication ([place]), Diverticulitis, Dyspnea, Endometriosis, GERD (gastroesophageal reflux disease), History of shingles (2000-2005), Hypercholesteremia, Hypertension, IBS (irritable bowel syndrome), Low back pain, Neuropathy, Orthopnea, Oxygen dependent, Personal history of chemotherapy, Personal history of radiation therapy, Pneumonia, Scoliosis, Sleep apnea, Stroke ([place]) (2010), and Withdrawal from sedative drug ([place]).   Surgical History:   Past Surgical History:  Procedure Laterality Date  ABDOMINAL HYSTERECTOMY  1987   BACK SURGERY     Tailbone removed following fracture   BREAST SURGERY Right    mastectomy   CARDIAC CATHETERIZATION     CATARACT EXTRACTION W/PHACO Right 05/19/2019   Procedure: CATARACT EXTRACTION PHACO AND INTRAOCULAR LENS PLACEMENT ([place]), RIGHT, DIABETIC;  Surgeon: Marchia Meiers, MD;  Location: [facility] ORS;  Service: Ophthalmology;  Laterality: Right;  Lot # [id number] H [place]: 00:43.8 CDE: 4.59   CATARACT EXTRACTION W/PHACO Left 06/16/2019   Procedure: CATARACT EXTRACTION PHACO AND INTRAOCULAR LENS PLACEMENT ([place]) LEFT VISION BLUE DIABETIC;  Surgeon: Marchia Meiers, MD;  Location: [facility] ORS;  Service: Ophthalmology;  Laterality: Left;  Lot #[id number] H [place]: 00:46.9 CDE: 6.53   COCCYX REMOVAL     COLONOSCOPY WITH PROPOFOL N/A 01/01/2017   Procedure: COLONOSCOPY WITH PROPOFOL;  Surgeon: Jonathon Bellows, MD;  Location: [facility] ENDOSCOPY;  Service: Endoscopy;  Laterality: N/A;   ELBOW ARTHROSCOPY WITH TENDON RECONSTRUCTION     ESOPHAGOGASTRODUODENOSCOPY (EGD) WITH PROPOFOL N/A 01/01/2017   Procedure:  ESOPHAGOGASTRODUODENOSCOPY (EGD) WITH PROPOFOL;  Surgeon: Jonathon Bellows, MD;  Location: [facility] ENDOSCOPY;  Service: Endoscopy;  Laterality: N/A;   INTRATHECAL PUMP IMPLANT     LEFT HEART CATH AND CORONARY ANGIOGRAPHY N/A 04/28/2017   Procedure: LEFT HEART CATH AND CORONARY ANGIOGRAPHY;  Surgeon: [name], MD;  Location: [facility] LAB;  Service: Cardiovascular;  Laterality: N/A;   MASTECTOMY Right 06/1997   morphine pump  2011   PLANTAR FASCIA RELEASE     TRIGGER FINGER RELEASE       Social History:   reports that she quit smoking about 17 years ago. Her smoking use included cigarettes. She has a 30.00 pack-year smoking history. She has never used smokeless tobacco. She reports that she does not drink alcohol and does not use drugs.   Family History:  Her family history includes Arthritis in her paternal grandfather; Cancer in her paternal grandmother and sister; Cancer (age of onset: 88) in her father; Cancer (age of onset: 16) in her mother; Hyperlipidemia in her sister and son; Hypertension in her sister; Osteoporosis in her maternal grandmother; Seizures in her son; Thyroid disease in her mother. There is no history of Breast cancer.   Allergies Allergies  Allergen Reactions   Other Palpitations    IV steroids   Pain Patch [Menthol] Anaphylaxis   Avelox [Moxifloxacin Hcl In Nacl] Other (See Comments)    Upset stomach   Doxycycline Diarrhea   Erythromycin Nausea Only and Other (See Comments)    Can take a Z-Pak just fine Other reaction(s): Other (See Comments) Can take Z-Pak  Can take a Z-Pak just fine   Fentanyl Nausea Only and Rash   Moxifloxacin Hcl Other (See Comments)    Upset stomach Upset stomach   Oxycontin [Oxycodone] Hives   Ozempic [Semaglutide] Nausea Only     Home Medications  Prior to Admission medications   Medication Sig Start Date End Date Taking? Authorizing Provider  atorvastatin (LIPITOR) 20 MG tablet Take 1 tablet (20 mg total) by mouth  daily. 04/29/21  Yes [name], Jolene T, NP  cholecalciferol (VITAMIN D3) 25 MCG (1000 UNIT) tablet Take 1,000 Units by mouth daily.   Yes [provider]  Cyanocobalamin 1000 MCG/ML KIT Inject 1,000 mcg as directed every 30 (thirty) days.   Yes [provider]  cyclobenzaprine (FLEXERIL) 5 MG tablet Take 5 mg by mouth 2 (two) times daily as needed for muscle spasms. Takes very rarely   Yes [provider]  diclofenac (VOLTAREN)  50 MG EC tablet TAKE 1 TABLET THREE TIMES DAILY 08/30/21  Yes [name], Jolene T, NP  Dulaglutide (TRULICITY) 4.5 XN/1.7GY SOPN Inject 4.5 mg as directed once a week. Patient taking differently: Inject 4.5 mg as directed once a week. Tuesday 07/20/21  Yes [name], Henrine Screws T, NP  folic acid (FOLVITE) 1 MG tablet TAKE 1 TABLET EVERY DAY 02/08/21  Yes Sindy Guadeloupe, MD  furosemide (LASIX) 40 MG tablet TAKE 1 TABLET EVERY DAY 07/15/21  Yes End, Harrell Gave, MD  gabapentin (NEURONTIN) 800 MG tablet Take 1 tablet (800 mg total) by mouth 3 (three) times daily. 10/25/20  Yes [name], Henrine Screws T, NP  metFORMIN (GLUCOPHAGE) 1000 MG tablet Take 1 tablet (1,000 mg total) by mouth 2 (two) times daily with a meal. 10/25/20  Yes [name], Jolene T, NP  metoprolol succinate (TOPROL-XL) 25 MG 24 hr tablet TAKE 1/2 TABLET EVERY DAY Patient taking differently: Take 12.5 mg by mouth daily. 05/13/21  Yes Dunn, Ryan M, PA-C  montelukast (SINGULAIR) 10 MG tablet Take 10 mg by mouth at bedtime.   Yes [provider]  predniSONE (DELTASONE) 20 MG tablet Take 2 tablets (40 mg total) by mouth daily with breakfast. 09/09/21  Yes Wouk, Ailene Rud, MD  rOPINIRole (REQUIP) 0.25 MG tablet Take 1 tablet (0.25 mg total) by mouth at bedtime. 08/14/21  Yes [name], Jolene T, NP  TRELEGY ELLIPTA 100-62.5-25 MCG/INH AEPB INHALE 1 PUFF INTO THE LUNGS DAILY. 12/03/20  Yes [name], Jolene T, NP  TRESIBA FLEXTOUCH 100 UNIT/ML FlexTouch Pen INJECT 30 UNITS INTO THE SKIN AT BEDTIME Patient  taking differently: Inject 50 Units into the skin at bedtime. 05/17/21  Yes [name], Jolene T, NP  venlafaxine XR (EFFEXOR-XR) 150 MG 24 hr capsule Take 1 capsule (150 mg total) by mouth daily. 10/22/20  Yes [name], Jolene T, NP  ACCU-CHEK AVIVA PLUS test strip TEST THREE TIMES DAILY 05/11/21   [name], Henrine Screws T, NP  Accu-Chek Softclix Lancets lancets TEST BLOOD SUGAR THREE TIMES DAILY 01/30/21   [name], Henrine Screws T, NP  albuterol (PROVENTIL HFA;VENTOLIN HFA) 108 (90 BASE) MCG/ACT inhaler Inhale 2 puffs into the lungs every 6 (six) hours as needed for wheezing or shortness of breath.     [provider]  Alcohol Swabs (B-D SINGLE USE SWABS REGULAR) PADS USE TWICE DAILY  WITH  SUGAR  CHECKS 01/16/21   [name], Jolene T, NP  Blood Glucose Monitoring Suppl (TRUE METRIX METER) w/Device KIT Use to check blood sugar 4 times a day 05/14/21   [name], Henrine Screws T, NP  lactulose (CHRONULAC) 10 GM/15ML solution Take 45 mLs (30 g total) by mouth daily as needed for mild constipation or severe constipation. 12/20/19   Loletha Grayer, MD  PAIN MANAGEMENT INTRATHECAL, IT, PUMP 1 each by Intrathecal route. Intrathecal (IT) medication:  Morphine Patient does not remember current. Adjusted every 2.5 months at [facility] in [place].    [provider]    Critical care provider statement:   Total critical care time: 33 minutes   Performed by: Lanney Gins MD   Critical care time was exclusive of separately billable procedures and treating other patients.   Critical care was necessary to treat or prevent imminent or life-threatening deterioration.   Critical care was time spent personally by me on the following activities: development of treatment plan with patient and/or surrogate as well as nursing, discussions with consultants, evaluation of patient's response to treatment, examination of patient, obtaining history from patient or surrogate, ordering and performing treatments  and  interventions, ordering and review of laboratory studies, ordering and review of radiographic studies, pulse oximetry and re-evaluation of patient's condition.    Ottie Glazier, M.D.  Pulmonary & Critical Care Medicine

## 2021-09-12 NOTE — Progress Notes (Addendum)
Inpatient Diabetes Program Recommendations  AACE/ADA: New Consensus Statement on Inpatient Glycemic Control (2015)  Target Ranges:  Prepandial:   less than 140 mg/dL      Peak postprandial:   less than 180 mg/dL (1-2 hours)      Critically ill patients:  140 - 180 mg/dL    Latest Reference Range & Units 09/11/21 17:44 09/11/21 20:46 09/12/21 07:16  Glucose-Capillary 70 - 99 mg/dL 237 (H)  5 units Novolog  245 (H)  2 units Novolog  212 (H)  5 units Novolog   (H): Data is abnormally high    Admit with: Severe sepsis and Acute on Chronic Hypoxic Respiratory Failure in the setting of multifocal pneumonia & AECOPD, along with atrial fibrillation with RVR   History: DM  Home DM Meds: Trulicity 4.5 mg Qweek        Metformin 1000 mg BID        Tresiba 50 units QHS  Current Orders: Novolog Moderate Correction Scale/ SSI (0-15 units) TID AC + HS     MD- Note patient getting Solumedrol 40 mg daily.  Takes large dose of Basal insulin (Tresiba 50 units QHS) at home.  Please consider starting Semglee 15 units QHS (1/3 total home dose to start)     --Will follow patient during hospitalization--  Wyn Quaker RN, MSN, CDE Diabetes Coordinator Inpatient Glycemic Control Team Team Pager: [phone] (8a-5p)

## 2021-09-12 NOTE — TOC Initial Note (Signed)
Transition of Care [facility]) - Initial/Assessment Note    Patient Details  Name: Jordan Chan MRN: [id number] Date of Birth: 11-27-1950  Transition [facility]) CM/SW Contact:    Shelbie Hutching, RN Phone Number: 09/12/2021, 11:01 AM  Clinical Narrative:                 Patient admitted to the hospital with pneumonia.  Patient is from home where her son lives with her.  Patient is married but separated, husband is at the bedside, he lives in another house not with patient but he is still very supportive of the patient and will be helping her out and will transport her home at discharge. Patient is independent and drives.  She is current with PCP, pulmonologist and cardiologist.    No TOC needs identified at this time.   Expected Discharge Plan: Home/Self Care Barriers to Discharge: Continued Medical Work up   Patient Goals and CMS Choice Patient states their goals for this hospitalization and ongoing recovery are:: to get back home and feeling better      Expected Discharge Plan and Services Expected Discharge Plan: Home/Self Care   Discharge Planning Services: CM Consult   Living arrangements for the past 2 months: Single Family Home                 DME Arranged: N/A DME Agency: NA       HH Arranged: NA HH Agency: NA        Prior Living Arrangements/Services Living arrangements for the past 2 months: Single Family Home Lives with:: Adult Children Patient language and need for interpreter reviewed:: Yes Do you feel safe going back to the place where you live?: Yes      Need for Family Participation in Patient Care: Yes (Comment) (pneumonia) Care giver support system in place?: Yes (comment) (husband and son)   Criminal Activity/Legal Involvement Pertinent to Current Situation/Hospitalization: No - Comment as needed  Activities of Daily Living Home Assistive Devices/Equipment: Oxygen ADL Screening (condition at time of admission) Patient's cognitive ability  adequate to safely complete daily activities?: Yes Is the patient deaf or have difficulty hearing?: No Does the patient have difficulty seeing, even when wearing glasses/contacts?: No Does the patient have difficulty concentrating, remembering, or making decisions?: No Patient able to express need for assistance with ADLs?: Yes Does the patient have difficulty dressing or bathing?: No Independently performs ADLs?: Yes (appropriate for developmental age) Does the patient have difficulty walking or climbing stairs?: No Weakness of Legs: Both Weakness of Arms/Hands: None  Permission Sought/Granted Permission sought to share information with : Case Manager, Family Supports Permission granted to share information with : Yes, Verbal Permission Granted  Share Information with NAME: Kambry Takacs     Permission granted to share info w Relationship: spouse  Permission granted to share info w Contact Information: [phone]  Emotional Assessment Appearance:: Appears stated age Attitude/Demeanor/Rapport: Engaged Affect (typically observed): Accepting Orientation: : Oriented to Self, Oriented to Place, Oriented to  Time, Oriented to Situation Alcohol / Substance Use: Not Applicable Psych Involvement: No (comment)  Admission diagnosis:  Atrial fibrillation with RVR ([place]) [I48.91] Sepsis ([place]) [A41.9] Multifocal pneumonia [J18.9] Dyspnea, unspecified type [R06.00] Patient Active Problem List   Diagnosis Date Noted   Sepsis ([place]) 09/11/2021   Paroxysmal atrial fibrillation with RVR ([place]) 09/11/2021   Multifocal pneumonia 09/11/2021   Sepsis due to pneumonia ([place]) 09/05/2021   COPD with acute exacerbation ([place]) 09/05/2021   Night sweats 07/31/2021  HCAP (healthcare-associated pneumonia) 07/31/2021   Chronic HFrEF (heart failure with reduced ejection fraction) ([place]) 01/18/2021   Iron deficiency anemia 12/28/2020   Type 2 diabetes, controlled, with neuropathy ([place]) 07/20/2020   Insulin  dependent type 2 diabetes mellitus ([place]) 07/20/2020   History of pulmonary embolus (PE) 07/04/2020   Aortic atherosclerosis ([place]) 07/01/2020   SVT (supraventricular tachycardia) ([place])    Vitamin D deficiency 07/17/2019   History of non-ST elevation myocardial infarction (NSTEMI)    Presence of intrathecal pump 04/27/2017   Advanced care planning/counseling discussion 11/14/2016   Cervical scoliosis 05/20/2016   Hypertension associated with diabetes ([place]) 06/18/2015   B12 deficiency 03/19/2015   Sleep apnea 11/16/2014   Allergic rhinitis 11/16/2014   History of breast cancer 11/16/2014   GERD (gastroesophageal reflux disease) 11/16/2014   COPD, moderate ([place]) 11/16/2014   Obesity 11/16/2014   Depression, major, single episode, in partial remission ([place]) 11/16/2014   Insomnia 11/16/2014   Chronic kidney disease, stage III (moderate) ([place]) 11/16/2014   Hyperlipidemia associated with type 2 diabetes mellitus ([place]) 11/16/2014   History of sarcoma 09/27/2013   Chronic pain syndrome 09/30/2012   PCP:  [name], NP Pharmacy:   [facility] [address], [place] [address] Phone: [phone] Fax: [phone]     Social Determinants of Health (SDOH) Interventions    Readmission Risk Interventions No flowsheet data found.

## 2021-09-12 NOTE — Consult Note (Signed)
PHARMACY CONSULT NOTE  Pharmacy Consult for Electrolyte Monitoring and Replacement   Recent Labs: Potassium (mmol/L)  Date Value  09/12/2021 4.3  09/14/2013 3.5   Magnesium (mg/dL)  Date Value  12/19/2019 2.0   Calcium (mg/dL)  Date Value  09/12/2021 7.6 (L)   Calcium, Total (mg/dL)  Date Value  09/14/2013 9.0   Albumin (g/dL)  Date Value  09/11/2021 3.2 (L)  04/29/2021 4.2  09/14/2013 3.2 (L)   Sodium (mmol/L)  Date Value  09/12/2021 135  07/31/2021 141  09/14/2013 133 (L)   Assessment: Patient is a 71 y/o F with medical history including HFrEF, HTN, SVT, history of PE, T2DM, GERD, COPD, depression, CKD who is admitted with new-onset Afib with RVR in the setting of multifocal pneumonia. Pharmacy consulted to assist with electrolyte monitoring and replacement as indicated.   Goal of Therapy:  K 4 Mg 2 All other electrolytes within normal limits  Plan:  --Mg 1.4, IV magnesium sulfate 2 g x 1 --Follow-up electrolytes with AM labs tomorrow  Jordan Chan 09/12/2021 8:54 AM

## 2021-09-12 NOTE — Progress Notes (Signed)
Progress Note  Patient Name: Jordan Chan Date of Encounter: 09/12/2021  Primary Cardiologist: End  Subjective   Converted to sinus rhythm at 20:45 last evening, following IV digoxin loading with a digoxin level of 2.4 today. She has maintained sinus rhythm since. Brief episode of chest pressure this morning consistent with prior episodes. Currently, chest pain free. Levophed stopped.   Inpatient Medications    Scheduled Meds:  azithromycin  500 mg Oral Daily   budesonide (PULMICORT) nebulizer solution  0.25 mg Nebulization BID   Chlorhexidine Gluconate Cloth  6 each Topical Daily   gabapentin  800 mg Oral TID   insulin aspart  0-15 Units Subcutaneous TID WC   insulin aspart  0-5 Units Subcutaneous QHS   levalbuterol  1.25 mg Nebulization Q6H   methylPREDNISolone (SOLU-MEDROL) injection  40 mg Intravenous Daily   metoprolol succinate  12.5 mg Oral Daily   montelukast  10 mg Oral QHS   rOPINIRole  0.25 mg Oral QHS   venlafaxine XR  150 mg Oral Daily   Continuous Infusions:  sodium chloride     amiodarone 30 mg/hr (09/12/21 0847)   ceFEPime (MAXIPIME) IV 2 g (09/12/21 0944)   heparin 950 Units/hr (09/12/21 0044)   PRN Meds: acetaminophen, docusate sodium, polyethylene glycol   Vital Signs    Vitals:   09/12/21 0630 09/12/21 0700 09/12/21 0800 09/12/21 0951  BP: 115/85 (!) 123/103 105/66 116/64  Pulse: 75 78 76 85  Resp: 18 17 19    Temp:  98 F (36.7 C)    TempSrc:  Oral    SpO2: 100% 100% 99%   Weight:      Height:        Intake/Output Summary (Last 24 hours) at 09/12/2021 1002 Last data filed at 09/12/2021 0847 Gross per 24 hour  Intake 1756.84 ml  Output 950 ml  Net 806.84 ml   Filed Weights   09/11/21 2040 09/12/21 0500  Weight: 69.7 kg 70.5 kg    Telemetry    Converted from Afib to sinus rhythm at 20:45 on 2/8, maintaining sinus rhythm - Personally Reviewed  ECG    No new tracings - Personally Reviewed  Physical Exam   GEN: No acute  distress.   Neck: No JVD. Cardiac: RRR, no murmurs, rubs, or gallops.  Respiratory: Coarse breath sounds bilaterally.  GI: Soft, nontender, non-distended.   MS: No edema; No deformity. Neuro:  Alert and oriented x 3; Nonfocal.  Psych: Normal affect.  Labs    Chemistry Recent Labs  Lab 09/05/21 1114 09/06/21 1884 09/08/21 0427 09/11/21 0933 09/12/21 0430  NA 132*   < > 135 136 135  K 3.2*   < > 3.7 3.4* 4.3  CL 91*   < > 94* 98 101  CO2 29   < > 28 29 27   GLUCOSE 287*   < > 237* 122* 238*  BUN 14   < > 16 21 16   CREATININE 0.89   < > 0.92 0.91 0.69  CALCIUM 8.5*   < > 9.0 8.3* 7.6*  PROT 8.2*  --   --  7.0  --   ALBUMIN 3.7  --   --  3.2*  --   AST 19  --   --  19  --   ALT 12  --   --  16  --   ALKPHOS 58  --   --  48  --   BILITOT 0.5  --   --  0.6  --   GFRNONAA >60   < > >60 >60 >60  ANIONGAP 12   < > 13 9 7    < > = values in this interval not displayed.     Hematology Recent Labs  Lab 09/08/21 0427 09/11/21 0933 09/12/21 0430  WBC 15.5* 16.4* 13.8*  RBC 3.68* 4.30 3.64*  HGB 11.1* 12.7 10.8*  HCT 35.2* 40.5 34.5*  MCV 95.7 94.2 94.8  MCH 30.2 29.5 29.7  MCHC 31.5 31.4 31.3  RDW 13.5 13.9 14.0  PLT 370 481* 390    Cardiac EnzymesNo results for input(s): TROPONINI in the last 168 hours. No results for input(s): TROPIPOC in the last 168 hours.   BNP Recent Labs  Lab 09/05/21 1114 09/11/21 0933  BNP 29.7 494.2*     DDimer No results for input(s): DDIMER in the last 168 hours.   Radiology    CT Angio Chest PE W and/or Wo Contrast  Result Date: 09/11/2021 IMPRESSION: Multifocal pneumonia, with slight improvement in the patchy bilateral airspace opacities since recent CT on 09/05/2021. Recommend follow-up chest CT in 3 months after treatment to ensure resolution. No evidence of pulmonary embolism. Aortic Atherosclerosis (ICD10-I70.0). Electronically Signed   By: Maurine Simmering M.D.   On: 09/11/2021 11:26   DG Chest Portable 1 View  Result Date:  09/11/2021 IMPRESSION: 1. Bilateral interstitial thickening concerning for pneumonia including atypical viral pneumonia. Electronically Signed   By: Kathreen Devoid M.D.   On: 09/11/2021 10:01   Cardiac Studies   2D echo 09/11/2021: 1. Left ventricular ejection fraction, by estimation, is 25 to 30%. The  left ventricle has severely decreased function. The left ventricle  demonstrates global hypokinesis. Left ventricular diastolic parameters are  consistent with Grade II diastolic  dysfunction (pseudonormalization).   2. Right ventricular systolic function is moderately reduced. The right  ventricular size is not well visualized.   3. The mitral valve is normal in structure. No evidence of mitral valve  regurgitation.   4. The aortic valve is grossly normal. Aortic valve regurgitation is not  visualized.   5. The inferior vena cava is normal in size with <50% respiratory  variability, suggesting right atrial pressure of 8 mmHg. __________  2D echo 12/11/2020: 1. Left ventricular ejection fraction, by estimation, is 45%. The left  ventricle has mildly decreased function. The left ventricle demonstrates  global hypokinesis. Left ventricular diastolic parameters are consistent  with Grade II diastolic dysfunction  (pseudonormalization).   2. Right ventricular systolic function is normal. The right ventricular  size is normal.   3. The mitral valve is normal in structure. No evidence of mitral valve  regurgitation.   4. The aortic valve is tricuspid. Aortic valve regurgitation is not  visualized.   Comparison(s): EF 45-50%. __________  LHC 04/28/2017: Conclusions: No angiographically significant coronary artery disease. Severely reduced LV contraction (LVEF 25-30%) with mid and apical akinesis, consistent with Takotsubo (stress-induced) cardiomyopathy. Mildly elevated left ventricular filling pressure.   Recommendations: Medical therapy, including initiation of metoprolol succinate 12.5  mg daily and furosemide 20 mg IV daily. If blood pressure tolerates, consider addition of low-dose ACE inhibitor tomorrow. Continue treatment of pain and opioid withdrawal.  Patient Profile     71 y.o. female with history of stress-induced cardiomyopathy in 04/2017 in the setting of pain pump malfunction and opioid withdrawal, PE following hospitalization for PNA in the summer of 2021 treated with Eliquis, anemia/leukocytosis/thrombocytosis followed by hematology, chronic low back pain, DM, COPD, sleep apnea intolerant  to CPAP on nocturnal oxygen, IBS, and GERD who was admitted with acute on chronic hypoxic respiratory failure and septic shock requiring vasopressor support with Levophed in the setting of PNA and acute copd exacerbation who we are seeing for Afib with RVR.  Assessment & Plan    1. Afib with RVR: -New onset -Likely in the setting of her acute illness -Converted to sinus rhythm at 20:45 on 2/8, and has maintained sinus since, following IV digoxin loading with a digoxin level of 2.4 today, stable -Defer Digibind at this time -Avoid further digoxin at this time -Trend digoxin level  -Continue IV amiodarone for now with likely transition to oral amiodarone tomorrow -Continue Toprol XL -Heparin gtt for now -CHADS2VASc at least 5 (CHF, age x 1, DM, vascular disease, sex category) -At discharge, plan for DOAC -TSH pending  2. HFrEF with history of stress-induced cardiomyopathy: -She appears largely euvolemic -Toprol XL -Relative hypotension precludes escalation of GDMT at this time, escalate as able -Previously noted to have hyperkalemia on ACEi leading this medication to be stopped -Consider tachy-mediated or stress-induced cardiomyopathy   -LHC in 2018 was without evidence of obstructive CAD -Will need outpatient ischemic evaluation   3. Acute on chronic hypoxic respiratory failure with septic shock secondary to PNA: -Improving -No longer on Levophed -Per CCM  4.  Hypomagnesemia: -Being repleted IV  5. Hypokalemia: -Repleted        For questions or updates, please contact [place] Please consult [email or web address] for contact info under Cardiology/STEMI.    Signed, [name], PA-C [place] Pager: [phone] 09/12/2021, 10:02 AM

## 2021-09-12 NOTE — Progress Notes (Signed)
[facility] for heparin Indication: atrial fibrillation  Allergies  Allergen Reactions   Other Palpitations    IV steroids   Pain Patch [Menthol] Anaphylaxis   Avelox [Moxifloxacin Hcl In Nacl] Other (See Comments)    Upset stomach   Doxycycline Diarrhea   Erythromycin Nausea Only and Other (See Comments)    Can take a Z-Pak just fine Other reaction(s): Other (See Comments) Can take Z-Pak  Can take a Z-Pak just fine   Fentanyl Nausea Only and Rash   Moxifloxacin Hcl Other (See Comments)    Upset stomach Upset stomach   Oxycontin [Oxycodone] Hives   Ozempic [Semaglutide] Nausea Only    Patient Measurements: Height: 4\' 10"  (147.3 cm) Weight: 69.7 kg (153 lb 10.6 oz) IBW/kg (Calculated) : 40.9 Heparin Dosing Weight: 59.6 kg  Vital Signs: Temp: 98.1 F (36.7 C) (02/08 2040) Temp Source: Oral (02/08 2040) BP: 104/50 (02/09 0000) Pulse Rate: 75 (02/09 0000)  Labs: Recent Labs    09/11/21 0933 09/11/21 1135 09/11/21 2350  HGB 12.7  --   --   HCT 40.5  --   --   PLT 481*  --   --   LABPROT 13.2  --   --   INR 1.0  --   --   HEPARINUNFRC  --   --  0.29*  CREATININE 0.91  --   --   TROPONINIHS 16 16  --      Estimated Creatinine Clearance: 46.9 mL/min (by C-G formula based on SCr of 0.91 mg/dL).   Medical History: Past Medical History:  Diagnosis Date   Arthritis    Asthma    Breast cancer ([place]) 1998   right breast ca with mastectomy and chemotherapy and radiation   Bronchitis    CHF (congestive heart failure) ([place])    "with Morphine withdrawal"   COPD (chronic obstructive pulmonary disease) ([place])    Diabetes mellitus without complication ([place])    Diverticulitis    diverticulosis also   Dyspnea    Endometriosis    GERD (gastroesophageal reflux disease)    History of shingles 2000-2005   Hypercholesteremia    Hypertension    IBS (irritable bowel syndrome)    Low back pain    a. Implanted  morphine/bupivicaine/clonidine pump.   Neuropathy    Orthopnea    Oxygen dependent    uses at night   Personal history of chemotherapy    Personal history of radiation therapy    Pneumonia    pneumonia 5-6 times, history of bronchitis also   Scoliosis    Sleep apnea    does not use cpap   Stroke ([place]) 2010   TIA, 10 years ago   Withdrawal from sedative drug ([place])    withdrawal from morphine when pump batteries died      Assessment: 71 year old female with respiratory failure s/t pneumonia and COPD exacerbation. Patient with atrial fibrillation requiring amiodarone infusion. No history of anticoagulation PTA. Pharmacy consulted for heparin management.  Goal of Therapy:  Heparin level 0.3-0.7 units/ml Monitor platelets by anticoagulation protocol: Yes  02/08 2350 HL 0.29, slightly therapeutic   Plan:  --Increase heparin infusion to 950 units/hr --Recheck HL in 8 hrs after rate change --CBC daily while on heparin infusion  Renda Rolls, PharmD, [facility] 09/12/2021 12:25 AM

## 2021-09-12 NOTE — Progress Notes (Signed)
ANTICOAGULATION CONSULT NOTE  Pharmacy Consult for IV Heparin Indication: atrial fibrillation  Patient Measurements: Height: 4\' 10"  (147.3 cm) Weight: 70.5 kg (155 lb 6.8 oz) IBW/kg (Calculated) : 40.9 Heparin Dosing Weight: 59.6 kg  Labs: Recent Labs    09/11/21 0933 09/11/21 1135 09/11/21 2350 09/12/21 0430 09/12/21 0906  HGB 12.7  --   --  10.8*  --   HCT 40.5  --   --  34.5*  --   PLT 481*  --   --  390  --   LABPROT 13.2  --   --   --   --   INR 1.0  --   --   --   --   HEPARINUNFRC  --   --  0.29*  --  0.38  CREATININE 0.91  --   --  0.69  --   TROPONINIHS 16 16  --   --   --      Estimated Creatinine Clearance: 53.7 mL/min (by C-G formula based on SCr of 0.69 mg/dL).   Medical History: Past Medical History:  Diagnosis Date   Arthritis    Asthma    Breast cancer ([place]) 1998   right breast ca with mastectomy and chemotherapy and radiation   Bronchitis    CHF (congestive heart failure) ([place])    "with Morphine withdrawal"   COPD (chronic obstructive pulmonary disease) ([place])    Diabetes mellitus without complication ([place])    Diverticulitis    diverticulosis also   Dyspnea    Endometriosis    GERD (gastroesophageal reflux disease)    History of shingles 2000-2005   Hypercholesteremia    Hypertension    IBS (irritable bowel syndrome)    Low back pain    a. Implanted morphine/bupivicaine/clonidine pump.   Neuropathy    Orthopnea    Oxygen dependent    uses at night   Personal history of chemotherapy    Personal history of radiation therapy    Pneumonia    pneumonia 5-6 times, history of bronchitis also   Scoliosis    Sleep apnea    does not use cpap   Stroke ([place]) 2010   TIA, 10 years ago   Withdrawal from sedative drug ([place])    withdrawal from morphine when pump batteries died   Assessment: 71 year old female with respiratory failure s/t pneumonia and COPD exacerbation. Patient with new onset atrial fibrillation requiring amiodarone infusion. No  history of anticoagulation PTA. Pharmacy consulted for heparin management.  Goal of Therapy:  Heparin level 0.3-0.7 units/ml Monitor platelets by anticoagulation protocol: Yes   Plan:  --Heparin level is therapeutic x 1 --Maintain heparin infusion at 950 units/hr --Recheck confirmatory HL in 8 hrs --CBC daily while on heparin infusion --Follow-up transition to oral anticoagulation as appropriate  Jordan Chan  09/12/2021 1:19 PM

## 2021-09-12 NOTE — Progress Notes (Signed)
[facility] [place] NAME: Jordan Chan    MR#:  [id number]  DATE OF BIRTH:  1950-11-19  SUBJECTIVE:   patient came in with significant shortness of breath and generalized fatigue. She was just recently discharged on 5 February after being treated for sepsis secondary to multifocal pneumonia and viral pneumonia.  [place] [facility] CM. Patient feeling much better. Continues to have some dry cough. Daughter at bedside. Currently in sinus rhythm heart rate 8282. On amiodarone and heparin drip. Worked with physical therapy. Did eat some breakfast this morning.  Sats 90--92% on room air   VITALS:  Blood pressure (!) 119/50, pulse 87, temperature 98 F (36.7 C), temperature source Oral, resp. rate (!) 21, height 4\' 10"  (1.473 m), weight 70.5 kg, SpO2 98 %.  PHYSICAL EXAMINATION:   GENERAL:  71 y.o.-year-old patient lying in the bed with no acute distress. Chronically ill deconditioned LUNGS: coarse breath sounds bilaterally, no wheezing, rales, rhonchi.  CARDIOVASCULAR: S1, S2 normal. No murmurs, rubs, or gallops.  ABDOMEN: Soft, nontender, nondistended. Bowel sounds present.  EXTREMITIES: No  edema b/l.    NEUROLOGIC: nonfocal PSYCHIATRIC:  patient is alert and awake SKIN: No obvious rash, lesion, or ulcer.   LABORATORY PANEL:  CBC Recent Labs  Lab 09/12/21 0430  WBC 13.8*  HGB 10.8*  HCT 34.5*  PLT 390    Chemistries  Recent Labs  Lab 09/11/21 0933 09/12/21 0430 09/12/21 0906  NA 136 135  --   K 3.4* 4.3  --   CL 98 101  --   CO2 29 27  --   GLUCOSE 122* 238*  --   BUN 21 16  --   CREATININE 0.91 0.69  --   CALCIUM 8.3* 7.6*  --   MG  --   --  1.4*  AST 19  --   --   ALT 16  --   --   ALKPHOS 48  --   --   BILITOT 0.6  --   --    Cardiac Enzymes No results for input(s): TROPONINI in the last 168 hours. RADIOLOGY:  CT Angio Chest PE W and/or Wo Contrast  Result Date: 09/11/2021 CLINICAL DATA:  Pulmonary  embolism (PE) suspected, high prob EXAM: CT ANGIOGRAPHY CHEST WITH CONTRAST TECHNIQUE: Multidetector CT imaging of the chest was performed using the standard protocol during bolus administration of intravenous contrast. Multiplanar CT image reconstructions and MIPs were obtained to evaluate the vascular anatomy. RADIATION DOSE REDUCTION: This exam was performed according to the departmental dose-optimization program which includes automated exposure control, adjustment of the mA and/or kV according to patient size and/or use of iterative reconstruction technique. CONTRAST:  52mL OMNIPAQUE IOHEXOL 350 MG/ML SOLN COMPARISON:  CT 09/05/2021 FINDINGS: Cardiovascular: Normal cardiac size.No pericardial disease.Normal size main and branch pulmonary arteries.No evidence of pulmonary embolism. Mild atherosclerosis of the aortic arch and descending aorta. Mediastinum/Nodes: No lymphadenopathy.Atrophic left thyroid.Esophagus is unremarkable.The trachea is unremarkable. Lungs/Pleura: There are patchy airspace opacities in the right upper, middle, and lower lobes which have improved since recent CT on 09/05/2021. Unchanged benign 4 mm solid nodule in the lingula (series 6, image 57) and 3 mm nodule in the superior segment of the left lower lobe (series 6, image 41), stable since October 2018. No other suspicious nodules. No pleural effusion. No pneumothorax. Unchanged partially calcified pleural chromic wall thickening in the right apex. Upper Abdomen: No acute abnormality. There are bilateral renal cysts, density measuring  as simple cysts. Musculoskeletal: There is a spinal cord stimulator with lead terminating at the level of T7. No acute osseous abnormality. No suspicious lytic or blastic lesions. Multilevel degenerative changes of the spine.No suspicious lytic or blastic lesions. Prior right mastectomy. Review of the MIP images confirms the above findings. IMPRESSION: Multifocal pneumonia, with slight improvement in the  patchy bilateral airspace opacities since recent CT on 09/05/2021. Recommend follow-up chest CT in 3 months after treatment to ensure resolution. No evidence of pulmonary embolism. Aortic Atherosclerosis (ICD10-I70.0). Electronically Signed   By: Maurine Simmering M.D.   On: 09/11/2021 11:26   DG Chest Portable 1 View  Result Date: 09/11/2021 CLINICAL DATA:  Shortness of breath.  No chest pain. EXAM: PORTABLE CHEST 1 VIEW COMPARISON:  09/05/2021 FINDINGS: Bilateral interstitial thickening concerning for pneumonia including atypical viral pneumonia. No focal consolidation. No pleural effusion or pneumothorax. Heart and mediastinal contours are unremarkable. No acute osseous abnormality. IMPRESSION: 1. Bilateral interstitial thickening concerning for pneumonia including atypical viral pneumonia. Electronically Signed   By: Kathreen Devoid M.D.   On: 09/11/2021 10:01   ECHOCARDIOGRAM COMPLETE  Result Date: 09/11/2021    ECHOCARDIOGRAM REPORT   Patient Name:   Jordan Chan Date of Exam: 09/11/2021 Medical Rec #:  [id number]          Height:       58.0 in Accession #:    [id number]         Weight:       148.9 lb Date of Birth:  [birth year]          BSA:          1.606 m Patient Age:    71 years           BP:           98/72 mmHg Patient Gender: F                  HR:           131 bpm. Exam Location:  [facility] Procedure: 2D Echo, Cardiac Doppler, Color Doppler and Intracardiac            Opacification Agent Indications:     I48.91 Atrial Fibrillation  History:         Patient has prior history of Echocardiogram examinations, most                  recent 12/11/2020. CHF, Stroke and COPD,                  Signs/Symptoms:Pneumonia. and Dyspnea; Risk                  Factors:Hypertension, Diabetes, Dyslipidemia and Sleep Apnea.  Sonographer:     Cresenciano Lick RDCS Referring Phys:  [id number] Bradly Bienenstock Diagnosing Phys: Kate Sable MD IMPRESSIONS  1. Left ventricular ejection fraction, by estimation, is 25 to 30%.  The left ventricle has severely decreased function. The left ventricle demonstrates global hypokinesis. Left ventricular diastolic parameters are consistent with Grade II diastolic dysfunction (pseudonormalization).  2. Right ventricular systolic function is moderately reduced. The right ventricular size is not well visualized.  3. The mitral valve is normal in structure. No evidence of mitral valve regurgitation.  4. The aortic valve is grossly normal. Aortic valve regurgitation is not visualized.  5. The inferior vena cava is normal in size with <50% respiratory variability, suggesting right atrial pressure of 8 mmHg. FINDINGS  Left Ventricle: Left ventricular  ejection fraction, by estimation, is 25 to 30%. The left ventricle has severely decreased function. The left ventricle demonstrates global hypokinesis. Definity contrast agent was given IV to delineate the left ventricular endocardial borders. The left ventricular internal cavity size was normal in size. There is no left ventricular hypertrophy. Left ventricular diastolic parameters are consistent with Grade II diastolic dysfunction (pseudonormalization). Right Ventricle: The right ventricular size is not well visualized. No increase in right ventricular wall thickness. Right ventricular systolic function is moderately reduced. Left Atrium: Left atrial size was normal in size. Right Atrium: Right atrial size was not well visualized. Pericardium: There is no evidence of pericardial effusion. Mitral Valve: The mitral valve is normal in structure. No evidence of mitral valve regurgitation. Tricuspid Valve: The tricuspid valve is not well visualized. Tricuspid valve regurgitation is not demonstrated. Aortic Valve: The aortic valve is grossly normal. Aortic valve regurgitation is not visualized. Pulmonic Valve: The pulmonic valve was not well visualized. Pulmonic valve regurgitation is not visualized. Aorta: The aortic root is normal in size and structure.  Venous: The inferior vena cava is normal in size with less than 50% respiratory variability, suggesting right atrial pressure of 8 mmHg. IAS/Shunts: No atrial level shunt detected by color flow Doppler.  LEFT VENTRICLE PLAX 2D LVIDd:         4.95 cm   Diastology LVIDs:         3.58 cm   LV e' medial:    6.58 cm/s LV PW:         0.88 cm   LV E/e' medial:  18.6 LV IVS:        0.67 cm   LV e' lateral:   8.16 cm/s LVOT diam:     1.90 cm   LV E/e' lateral: 15.0 LV SV:         29 LV SV Index:   18 LVOT Area:     2.84 cm  RIGHT VENTRICLE             IVC RV Basal diam:  2.80 cm     IVC diam: 2.04 cm RV S prime:     10.30 cm/s TAPSE (M-mode): 1.0 cm LEFT ATRIUM             Index        RIGHT ATRIUM           Index LA diam:        3.90 cm 2.43 cm/m   RA Area:     12.30 cm LA Vol (A2C):   34.6 ml 21.54 ml/m  RA Volume:   30.40 ml  18.92 ml/m LA Vol (A4C):   41.1 ml 25.58 ml/m LA Biplane Vol: 38.1 ml 23.72 ml/m  AORTIC VALVE LVOT Vmax:   62.90 cm/s LVOT Vmean:  49.100 cm/s LVOT VTI:    0.101 m  AORTA Ao Root diam: 3.00 cm MV E velocity: 122.33 cm/s                             SHUNTS                             Systemic VTI:  0.10 m                             Systemic Diam: 1.90 cm Kate Sable MD Electronically signed  by Kate Sable MD Signature Date/Time: 09/11/2021/7:30:13 PM    Final     Assessment and Plan  Canesha Tesfaye is a 71 year old female with a past medical history significant for COPD requiring 2 L supplemental O2 at nighttime, HFrEF (last EF 45%) hypertension, hyperlipidemia, VTE, diabetes mellitus, breast cancer who presented to [facility] ED on 09/11/2021 due to complaints of progressive shortness of breath and generalized malaise/fatigue, pleuritic chest pain, and nausea/vomiting  acute on chronic hypoxic respiratory failure secondary to COPD exacerbation residual multifocal pneumonia recent meta-pneumo virus pneumonia -- patient currently on room air. Sats 9294%. -- Switch to oral  antibiotic -- Pro calcitonin times two negative -- no fever -- patient is dry cough -- white count 13.8 -- respiratory viral panel this admission negative -- blood culture negative -- CT chest no PE. Residual multifocal pneumonia -- sepsis resolved -- PRN bronchodilators  a fib with RVR -- patient currently in sinus rhythm -- followed by [facility] cardiology. Discussed with Dr. Rockey Situ patient was on IV amiodarone drip will switch to 400 mg BID for now. -- Continue IV heparin drip-- defer chronic anticoagulation to cardiology  Chronic congestive heart failure systolic with stress induced cardiomyopathy -- patient euvolemic -- appreciate cardiology input. She will need outpatient ischemic evaluation  electrolyte abnormality -- replete with pharmacy assistance  generalized weakness and deconditioning -- PT OT to see patient  TOC for discharge planning  Procedures: Family communication : daughter at bedside Consults : [facility] cardiology CODE STATUS: full DVT Prophylaxis : IV heparin drip Level of care: Telemetry Medical Status is: Inpatient Remains inpatient appropriate because: a fib with RVR, respiratory failure.  Transfer out of ICU          TOTAL TIME TAKING CARE OF THIS PATIENT: 35 minutes.  >50% time spent on counselling and coordination of care  Note: This dictation was prepared with Dragon dictation along with smaller phrase technology. Any transcriptional errors that result from this process are unintentional.  Fritzi Mandes M.D    [facility]   CC: Primary care physician; [name], NP

## 2021-09-12 NOTE — Progress Notes (Signed)
PHARMACIST - PHYSICIAN COMMUNICATION  CONCERNING: Antibiotic IV to Oral Route Change Policy  RECOMMENDATION: This patient is receiving azithromycin by the intravenous route.  Based on criteria approved by the Pharmacy and Therapeutics Committee, the antibiotic(s) is/are being converted to the equivalent oral dose form(s).   DESCRIPTION: These criteria include: Patient being treated for a respiratory tract infection, urinary tract infection, cellulitis or clostridium difficile associated diarrhea if on metronidazole The patient is not neutropenic and does not exhibit a GI malabsorption state The patient is eating (either orally or via tube) and/or has been taking other orally administered medications for a least 24 hours The patient is improving clinically and has a Tmax < 100.5  If you have questions about this conversion, please contact the [place]  09/12/21

## 2021-09-12 NOTE — Progress Notes (Signed)
ANTICOAGULATION CONSULT NOTE  Pharmacy Consult for IV Heparin Indication: atrial fibrillation  Patient Measurements: Height: 4\' 10"  (147.3 cm) Weight: 70.5 kg (155 lb 6.8 oz) IBW/kg (Calculated) : 40.9 Heparin Dosing Weight: 59.6 kg  Labs: Recent Labs    09/11/21 0933 09/11/21 1135 09/11/21 2350 09/12/21 0430 09/12/21 0906 09/12/21 1659  HGB 12.7  --   --  10.8*  --   --   HCT 40.5  --   --  34.5*  --   --   PLT 481*  --   --  390  --   --   LABPROT 13.2  --   --   --   --   --   INR 1.0  --   --   --   --   --   HEPARINUNFRC  --   --  0.29*  --  0.38 0.29*  CREATININE 0.91  --   --  0.69  --   --   TROPONINIHS 16 16  --   --   --   --      Estimated Creatinine Clearance: 53.7 mL/min (by C-G formula based on SCr of 0.69 mg/dL).   Medical History: Past Medical History:  Diagnosis Date   Arthritis    Asthma    Breast cancer ([place]) 1998   right breast ca with mastectomy and chemotherapy and radiation   Bronchitis    CHF (congestive heart failure) ([place])    "with Morphine withdrawal"   COPD (chronic obstructive pulmonary disease) ([place])    Diabetes mellitus without complication ([place])    Diverticulitis    diverticulosis also   Dyspnea    Endometriosis    GERD (gastroesophageal reflux disease)    History of shingles 2000-2005   Hypercholesteremia    Hypertension    IBS (irritable bowel syndrome)    Low back pain    a. Implanted morphine/bupivicaine/clonidine pump.   Neuropathy    Orthopnea    Oxygen dependent    uses at night   Personal history of chemotherapy    Personal history of radiation therapy    Pneumonia    pneumonia 5-6 times, history of bronchitis also   Scoliosis    Sleep apnea    does not use cpap   Stroke ([place]) 2010   TIA, 10 years ago   Withdrawal from sedative drug ([place])    withdrawal from morphine when pump batteries died   Assessment: 71 year old female with respiratory failure s/t pneumonia and COPD exacerbation. Patient with new  onset atrial fibrillation requiring amiodarone infusion. No history of anticoagulation PTA. Pharmacy consulted for heparin management.  Goal of Therapy:  Heparin level 0.3-0.7 units/ml Monitor platelets by anticoagulation protocol: Yes   Plan:  --Heparin level subtherapeutic --Heparin 800 unit bolus --Increase heparin infusion to 1050 units/hr --Recheck HL 2/10 at 0300 --CBC daily while on heparin infusion --Follow-up transition to oral anticoagulation as appropriate  Tawnya Crook, PharmD, BCPS Clinical Pharmacist 09/12/2021 5:50 PM

## 2021-09-12 NOTE — Plan of Care (Signed)

## 2021-09-13 LAB — CBC
HCT: 33.4 % — ABNORMAL LOW (ref 36.0–46.0)
Hemoglobin: 10.7 g/dL — ABNORMAL LOW (ref 12.0–15.0)
MCH: 30.4 pg (ref 26.0–34.0)
MCHC: 32 g/dL (ref 30.0–36.0)
MCV: 94.9 fL (ref 80.0–100.0)
Platelets: 396 10*3/uL (ref 150–400)
RBC: 3.52 MIL/uL — ABNORMAL LOW (ref 3.87–5.11)
RDW: 13.9 % (ref 11.5–15.5)
WBC: 14.1 10*3/uL — ABNORMAL HIGH (ref 4.0–10.5)
nRBC: 0 % (ref 0.0–0.2)

## 2021-09-13 LAB — BASIC METABOLIC PANEL
Anion gap: 6 (ref 5–15)
BUN: 14 mg/dL (ref 8–23)
CO2: 28 mmol/L (ref 22–32)
Calcium: 8.6 mg/dL — ABNORMAL LOW (ref 8.9–10.3)
Chloride: 101 mmol/L (ref 98–111)
Creatinine, Ser: 0.85 mg/dL (ref 0.44–1.00)
GFR, Estimated: >60 mL/min (ref >60)
Glucose, Bld: 232 mg/dL — ABNORMAL HIGH (ref 70–99)
Potassium: 4 mmol/L (ref 3.5–5.1)
Sodium: 135 mmol/L (ref 135–145)

## 2021-09-13 LAB — GLUCOSE, CAPILLARY
Glucose-Capillary: 170 mg/dL — ABNORMAL HIGH (ref 70–99)
Glucose-Capillary: 370 mg/dL — ABNORMAL HIGH (ref 70–99)
Glucose-Capillary: 368 mg/dL — ABNORMAL HIGH (ref 70–99)
Glucose-Capillary: 267 mg/dL — ABNORMAL HIGH (ref 70–99)

## 2021-09-13 LAB — PROCALCITONIN: Procalcitonin: <0.1 ng/mL

## 2021-09-13 LAB — HEPARIN LEVEL (UNFRACTIONATED)
Heparin Unfractionated: 0.46 IU/mL (ref 0.30–0.70)
Heparin Unfractionated: 0.33 IU/mL (ref 0.30–0.70)

## 2021-09-13 LAB — DIGOXIN LEVEL: Digoxin Level: 0.6 ng/mL — ABNORMAL LOW (ref 0.8–2.0)

## 2021-09-13 LAB — MAGNESIUM: Magnesium: 1.9 mg/dL (ref 1.7–2.4)

## 2021-09-13 MED ORDER — MAGNESIUM SULFATE 2 GM/50ML IV SOLN
2.0000 g | Freq: Once | INTRAVENOUS | Status: AC
  Administered 2021-09-13: 2 g via INTRAVENOUS
  Filled 2021-09-13: qty 50

## 2021-09-13 MED ORDER — PREDNISONE 20 MG PO TABS
40.0000 mg | ORAL_TABLET | Freq: Every day | ORAL | Status: DC
  Administered 2021-09-14: 40 mg via ORAL
  Filled 2021-09-13: qty 2

## 2021-09-13 MED ORDER — PANTOPRAZOLE SODIUM 40 MG PO TBEC
40.0000 mg | DELAYED_RELEASE_TABLET | Freq: Every day | ORAL | Status: DC
  Administered 2021-09-13 – 2021-09-14 (×2): 40 mg via ORAL
  Filled 2021-09-13 (×2): qty 1

## 2021-09-13 MED ORDER — APIXABAN 5 MG PO TABS
5.0000 mg | ORAL_TABLET | Freq: Two times a day (BID) | ORAL | Status: DC
  Administered 2021-09-13 – 2021-09-14 (×3): 5 mg via ORAL
  Filled 2021-09-13 (×3): qty 1

## 2021-09-13 NOTE — Consult Note (Signed)
PHARMACY CONSULT NOTE  Pharmacy Consult for Electrolyte Monitoring and Replacement   Recent Labs: Potassium (mmol/L)  Date Value  09/13/2021 4.0  09/14/2013 3.5   Magnesium (mg/dL)  Date Value  09/13/2021 1.9   Calcium (mg/dL)  Date Value  09/13/2021 8.6 (L)   Calcium, Total (mg/dL)  Date Value  09/14/2013 9.0   Albumin (g/dL)  Date Value  09/11/2021 3.2 (L)  04/29/2021 4.2  09/14/2013 3.2 (L)   Phosphorus (mg/dL)  Date Value  09/12/2021 2.6   Sodium (mmol/L)  Date Value  09/13/2021 135  07/31/2021 141  09/14/2013 133 (L)   Assessment: Patient is a 71 y/o F with medical history including HFrEF, HTN, SVT, history of PE, T2DM, GERD, COPD, depression, CKD who is admitted with new-onset Afib with RVR in the setting of multifocal pneumonia. Pharmacy consulted to assist with electrolyte monitoring and replacement as indicated.   Goal of Therapy:  K 4 Mg 2 All other electrolytes within normal limits  Plan:  Will give Mg 2g IV x 1. Follow-up electrolytes with AM labs tomorrow  Oswald Hillock, PharmD 09/13/2021 8:51 AM

## 2021-09-13 NOTE — Progress Notes (Signed)
ANTICOAGULATION CONSULT NOTE  Pharmacy Consult for IV Heparin Indication: atrial fibrillation  Patient Measurements: Height: 4\' 10"  (147.3 cm) Weight: 70.5 kg (155 lb 6.8 oz) IBW/kg (Calculated) : 40.9 Heparin Dosing Weight: 59.6 kg  Labs: Recent Labs    09/11/21 0933 09/11/21 1135 09/11/21 2350 09/12/21 0430 09/12/21 0906 09/12/21 1659 09/13/21 0259  HGB 12.7  --   --  10.8*  --   --  10.7*  HCT 40.5  --   --  34.5*  --   --  33.4*  PLT 481*  --   --  390  --   --  396  LABPROT 13.2  --   --   --   --   --   --   INR 1.0  --   --   --   --   --   --   HEPARINUNFRC  --   --    < >  --  0.38 0.29* 0.46  CREATININE 0.91  --   --  0.69  --   --  0.85  TROPONINIHS 16 16  --   --   --   --   --    < > = values in this interval not displayed.     Estimated Creatinine Clearance: 50.5 mL/min (by C-G formula based on SCr of 0.85 mg/dL).   Medical History: Past Medical History:  Diagnosis Date   Arthritis    Asthma    Breast cancer ([place]) 1998   right breast ca with mastectomy and chemotherapy and radiation   Bronchitis    CHF (congestive heart failure) ([place])    "with Morphine withdrawal"   COPD (chronic obstructive pulmonary disease) ([place])    Diabetes mellitus without complication ([place])    Diverticulitis    diverticulosis also   Dyspnea    Endometriosis    GERD (gastroesophageal reflux disease)    History of shingles 2000-2005   Hypercholesteremia    Hypertension    IBS (irritable bowel syndrome)    Low back pain    a. Implanted morphine/bupivicaine/clonidine pump.   Neuropathy    Orthopnea    Oxygen dependent    uses at night   Personal history of chemotherapy    Personal history of radiation therapy    Pneumonia    pneumonia 5-6 times, history of bronchitis also   Scoliosis    Sleep apnea    does not use cpap   Stroke ([place]) 2010   TIA, 10 years ago   Withdrawal from sedative drug ([place])    withdrawal from morphine when pump batteries died    Assessment: 71 year old female with respiratory failure s/t pneumonia and COPD exacerbation. Patient with new onset atrial fibrillation requiring amiodarone infusion. No history of anticoagulation PTA. Pharmacy consulted for heparin management.  Goal of Therapy:  Heparin level 0.3-0.7 units/ml Monitor platelets by anticoagulation protocol: Yes   Plan:  --Heparin level therapeutic x 1 --Continue heparin infusion at 1050 units/hr --Recheck HL in 8 hrs to confirm --CBC daily while on heparin infusion --Follow-up transition to oral anticoagulation as appropriate  Renda Rolls, PharmD, [facility] 09/13/2021 4:25 AM

## 2021-09-13 NOTE — Progress Notes (Signed)
Inpatient Diabetes Program Recommendations  AACE/ADA: New Consensus Statement on Inpatient Glycemic Control   Target Ranges:  Prepandial:   less than 140 mg/dL      Peak postprandial:   less than 180 mg/dL (1-2 hours)      Critically ill patients:  140 - 180 mg/dL    Latest Reference Range & Units 09/12/21 07:16 09/12/21 11:53 09/12/21 16:23 09/12/21 21:53 09/13/21 07:43  Glucose-Capillary 70 - 99 mg/dL 212 (H) 224 (H) 265 (H) 250 (H) 170 (H)  (H): Data is abnormally highReview of Glycemic Control  Diabetes history: DM2 Outpatient Diabetes medications: Trulicity 4.5 mg Qweek, Metformin 1000 mg BID, Tresiba 50 units QHS Current orders for Inpatient glycemic control: Semglee 15 units QD, Novolog 0-15 units TID with meals, Novolog 0-5 units QHS; Solumedrol 40 mg daily  Inpatient Diabetes Program Recommendations:    Insulin: If steroids are continued, please consider ordering Novolog 4 units TID with meals for meal coverage if patient eats at least 50% of meals.  Thanks, Barnie Alderman, RN, MSN, CDE Diabetes Coordinator Inpatient Diabetes Program [phone] (Team Pager from 8am to 5pm)

## 2021-09-13 NOTE — Progress Notes (Addendum)
Mobility Specialist - Progress Note   Pre-mobility: 99 HR, BP, 96% SpO2 During mobility: 98 HR, BP,  87%  SpO2 Post-mobility: 98 HR, BP, 94% SPO2   Pt supine upon arrival using RA. Pt sat EOB with supervision. Pt ambulated 160 ft with supervision with 4 breaks d/t SOB. O2 appeared to desat to 87%, unable to determine accuracy d/t faux nails. Pt reports LE weakness with activity, but motivated to finish lapping RN station. Upon return to bed, pt voiced back pain extending to left rib area---per pt not new and d/t persistent cough. Pt left supine with needs in reach.  Kathee Delton Mobility Specialist 09/13/21, 4:12 PM

## 2021-09-13 NOTE — Discharge Instructions (Signed)

## 2021-09-13 NOTE — Progress Notes (Signed)
Progress Note  Patient Name: Jordan Chan Date of Encounter: 09/13/2021  Primary Cardiologist: End  Subjective   Transferred out of the ICU yesterday. Maintaining sinus rhythm, now on oral amiodarone. No chest pain. Continues to feel weak with ambulation.    Inpatient Medications    Scheduled Meds:  amiodarone  400 mg Oral BID   budesonide (PULMICORT) nebulizer solution  0.25 mg Nebulization BID   cefUROXime  500 mg Oral BID WC   Chlorhexidine Gluconate Cloth  6 each Topical Daily   dextromethorphan  15 mg Oral BID   gabapentin  800 mg Oral TID   insulin aspart  0-15 Units Subcutaneous TID WC   insulin aspart  0-5 Units Subcutaneous QHS   insulin glargine-yfgn  15 Units Subcutaneous Daily   levalbuterol  1.25 mg Nebulization Q6H   metoprolol succinate  12.5 mg Oral Daily   montelukast  10 mg Oral QHS   [START ON 09/14/2021] predniSONE  40 mg Oral Q breakfast   rOPINIRole  0.25 mg Oral QHS   venlafaxine XR  150 mg Oral Daily   Continuous Infusions:  sodium chloride     heparin 1,050 Units/hr (09/12/21 1924)   magnesium sulfate bolus IVPB 2 g (09/13/21 1056)   PRN Meds: acetaminophen, docusate sodium, polyethylene glycol   Vital Signs    Vitals:   09/13/21 0034 09/13/21 0421 09/13/21 0742 09/13/21 0818  BP: 125/60 113/63 121/60   Pulse: 85 74 80   Resp: 20 15 17    Temp: 98.3 F (36.8 C) 98 F (36.7 C) 98 F (36.7 C)   TempSrc: Oral     SpO2: 100% 99% 97% 97%  Weight:      Height:        Intake/Output Summary (Last 24 hours) at 09/13/2021 1104 Last data filed at 09/12/2021 1842 Gross per 24 hour  Intake 821.61 ml  Output --  Net 821.61 ml    Filed Weights   09/11/21 2040 09/12/21 0500  Weight: 69.7 kg 70.5 kg    Telemetry    SR - Personally Reviewed  ECG    No new tracings - Personally Reviewed  Physical Exam   GEN: No acute distress.   Neck: No JVD. Cardiac: RRR, no murmurs, rubs, or gallops.  Respiratory: Coarse breath sounds  bilaterally.  GI: Soft, nontender, non-distended.   MS: No edema; No deformity. Neuro:  Alert and oriented x 3; Nonfocal.  Psych: Normal affect.  Labs    Chemistry Recent Labs  Lab 09/11/21 0933 09/12/21 0430 09/13/21 0259  NA 136 135 135  K 3.4* 4.3 4.0  CL 98 101 101  CO2 29 27 28   GLUCOSE 122* 238* 232*  BUN 21 16 14   CREATININE 0.91 0.69 0.85  CALCIUM 8.3* 7.6* 8.6*  PROT 7.0  --   --   ALBUMIN 3.2*  --   --   AST 19  --   --   ALT 16  --   --   ALKPHOS 48  --   --   BILITOT 0.6  --   --   GFRNONAA >60 >60 >60  ANIONGAP 9 7 6       Hematology Recent Labs  Lab 09/11/21 0933 09/12/21 0430 09/13/21 0259  WBC 16.4* 13.8* 14.1*  RBC 4.30 3.64* 3.52*  HGB 12.7 10.8* 10.7*  HCT 40.5 34.5* 33.4*  MCV 94.2 94.8 94.9  MCH 29.5 29.7 30.4  MCHC 31.4 31.3 32.0  RDW 13.9 14.0 13.9  PLT  481* 390 396     Cardiac EnzymesNo results for input(s): TROPONINI in the last 168 hours. No results for input(s): TROPIPOC in the last 168 hours.   BNP Recent Labs  Lab 09/11/21 0933  BNP 494.2*      DDimer No results for input(s): DDIMER in the last 168 hours.   Radiology    CT Angio Chest PE W and/or Wo Contrast  Result Date: 09/11/2021 IMPRESSION: Multifocal pneumonia, with slight improvement in the patchy bilateral airspace opacities since recent CT on 09/05/2021. Recommend follow-up chest CT in 3 months after treatment to ensure resolution. No evidence of pulmonary embolism. Aortic Atherosclerosis (ICD10-I70.0). Electronically Signed   By: Maurine Simmering M.D.   On: 09/11/2021 11:26   DG Chest Portable 1 View  Result Date: 09/11/2021 IMPRESSION: 1. Bilateral interstitial thickening concerning for pneumonia including atypical viral pneumonia. Electronically Signed   By: Kathreen Devoid M.D.   On: 09/11/2021 10:01   Cardiac Studies   2D echo 09/11/2021: 1. Left ventricular ejection fraction, by estimation, is 25 to 30%. The  left ventricle has severely decreased function. The  left ventricle  demonstrates global hypokinesis. Left ventricular diastolic parameters are  consistent with Grade II diastolic  dysfunction (pseudonormalization).   2. Right ventricular systolic function is moderately reduced. The right  ventricular size is not well visualized.   3. The mitral valve is normal in structure. No evidence of mitral valve  regurgitation.   4. The aortic valve is grossly normal. Aortic valve regurgitation is not  visualized.   5. The inferior vena cava is normal in size with <50% respiratory  variability, suggesting right atrial pressure of 8 mmHg. __________  2D echo 12/11/2020: 1. Left ventricular ejection fraction, by estimation, is 45%. The left  ventricle has mildly decreased function. The left ventricle demonstrates  global hypokinesis. Left ventricular diastolic parameters are consistent  with Grade II diastolic dysfunction  (pseudonormalization).   2. Right ventricular systolic function is normal. The right ventricular  size is normal.   3. The mitral valve is normal in structure. No evidence of mitral valve  regurgitation.   4. The aortic valve is tricuspid. Aortic valve regurgitation is not  visualized.   Comparison(s): EF 45-50%. __________  LHC 04/28/2017: Conclusions: No angiographically significant coronary artery disease. Severely reduced LV contraction (LVEF 25-30%) with mid and apical akinesis, consistent with Takotsubo (stress-induced) cardiomyopathy. Mildly elevated left ventricular filling pressure.   Recommendations: Medical therapy, including initiation of metoprolol succinate 12.5 mg daily and furosemide 20 mg IV daily. If blood pressure tolerates, consider addition of low-dose ACE inhibitor tomorrow. Continue treatment of pain and opioid withdrawal.  Patient Profile     72 y.o. female with history of stress-induced cardiomyopathy in 04/2017 in the setting of pain pump malfunction and opioid withdrawal, PE following  hospitalization for PNA in the summer of 2021 treated with Eliquis, anemia/leukocytosis/thrombocytosis followed by hematology, chronic low back pain, DM, COPD, sleep apnea intolerant to CPAP on nocturnal oxygen, IBS, and GERD who was admitted with acute on chronic hypoxic respiratory failure and septic shock requiring vasopressor support with Levophed in the setting of PNA and acute copd exacerbation who we are seeing for Afib with RVR.  Assessment & Plan    1. Afib with RVR: -New onset -Likely in the setting of her acute illness -Converted to sinus rhythm at 20:45 on 2/8, and has maintained sinus since, following IV digoxin loading with a digoxin level of 2.4 on 2/8, improved to 0.6 today  off digoxin  -Defer Digibind at this time -Avoid further digoxin at this time -Now on oral amiodarone 400 mg bid for 1 week, followed by 200 mg bid for 1 week, followed by 200 mg daily thereafter to complete a 10 gram load -Continue Toprol XL, BP has precluded escalation  -Stop heparin gtt -Start Eliquis 5 mg bid -CHADS2VASc at least 5 (CHF, age x 1, DM, vascular disease, sex category) -TSH mildly elevated with normal total T4 and free thyroxine   2. HFrEF with history of stress-induced cardiomyopathy: -She appears largely euvolemic -Toprol XL -Relative hypotension precludes escalation of GDMT at this time, escalate as able -Previously noted to have hyperkalemia on ACEi leading this medication to be stopped -Consider tachy-mediated or stress-induced cardiomyopathy   -LHC in 2018 was without evidence of obstructive CAD -Repeat limited echo once her illness is improved  -Will need outpatient ischemic evaluation   3. Acute on chronic hypoxic respiratory failure with septic shock secondary to PNA: -Improving -No longer on Levophed -Per CCM  4. Hypomagnesemia: -Repleted  5. Hypokalemia: -Repleted      For questions or updates, please contact [place] Please consult [email or web address] for contact  info under Cardiology/STEMI.    Signed, [name], PA-C [place] Pager: [phone] 09/13/2021, 11:04 AM

## 2021-09-13 NOTE — Plan of Care (Signed)

## 2021-09-13 NOTE — Care Management Important Message (Signed)
Important Message  Patient Details  Name: Jordan Chan MRN: [id number] Date of Birth: 1951-03-09   Medicare Important Message Given:  N/A - LOS <3 / Initial given by admissions     Dannette Barbara 09/13/2021, 9:21 AM

## 2021-09-13 NOTE — Progress Notes (Signed)
[facility] NAME: Jordan Chan    MR#:  [id number]  DATE OF BIRTH:  [birth year]  SUBJECTIVE:   patient came in with significant shortness of breath and generalized fatigue. She was just recently discharged on 5 February after being treated for sepsis secondary to multifocal pneumonia and viral pneumonia.  Sats 90--92% on room air.. Patient's ex-husband in the room. She is been complaining of shortness of breath although ambulating well to the bathroom without dropping sats. Has some dry cough.   VITALS:  Blood pressure 106/60, pulse 87, temperature 98.4 F (36.9 C), temperature source Oral, resp. rate 19, height 4\' 10"  (1.473 m), weight 70.5 kg, SpO2 100 %.  PHYSICAL EXAMINATION:   GENERAL:  71 y.o.-year-old patient lying in the bed with no acute distress. Chronically ill deconditioned LUNGS: coarse breath sounds bilaterally, no wheezing, rales, rhonchi.  CARDIOVASCULAR: S1, S2 normal. No murmurs, rubs, or gallops.  ABDOMEN: Soft, nontender, nondistended. Bowel sounds present.  EXTREMITIES: No  edema b/l.    NEUROLOGIC: nonfocal PSYCHIATRIC:  patient is alert and awake SKIN: No obvious rash, lesion, or ulcer.   LABORATORY PANEL:  CBC Recent Labs  Lab 09/13/21 0259  WBC 14.1*  HGB 10.7*  HCT 33.4*  PLT 396     Chemistries  Recent Labs  Lab 09/11/21 0933 09/12/21 0430 09/13/21 0259  NA 136   < > 135  K 3.4*   < > 4.0  CL 98   < > 101  CO2 29   < > 28  GLUCOSE 122*   < > 232*  BUN 21   < > 14  CREATININE 0.91   < > 0.85  CALCIUM 8.3*   < > 8.6*  MG  --    < > 1.9  AST 19  --   --   ALT 16  --   --   ALKPHOS 48  --   --   BILITOT 0.6  --   --    < > = values in this interval not displayed.    Cardiac Enzymes No results for input(s): TROPONINI in the last 168 hours. RADIOLOGY:  ECHOCARDIOGRAM COMPLETE  Result Date: 09/11/2021    ECHOCARDIOGRAM REPORT   Patient Name:   Jordan Chan Jordan Chan Date of Exam:  09/11/2021 Medical Rec #:  [id number]          Height:       58.0 in Accession #:    [id number]         Weight:       148.9 lb Date of Birth:  06-12-1951          BSA:          1.606 m Patient Age:    42 years           BP:           98/72 mmHg Patient Gender: F                  HR:           131 bpm. Exam Location:  [facility] Procedure: 2D Echo, Cardiac Doppler, Color Doppler and Intracardiac            Opacification Agent Indications:     I48.91 Atrial Fibrillation  History:         Patient has prior history of Echocardiogram examinations, most  recent 12/11/2020. CHF, Stroke and COPD,                  Signs/Symptoms:Pneumonia. and Dyspnea; Risk                  Factors:Hypertension, Diabetes, Dyslipidemia and Sleep Apnea.  Sonographer:     Cresenciano Lick RDCS Referring Phys:  [id number] Bradly Bienenstock Diagnosing Phys: Kate Sable MD IMPRESSIONS  1. Left ventricular ejection fraction, by estimation, is 25 to 30%. The left ventricle has severely decreased function. The left ventricle demonstrates global hypokinesis. Left ventricular diastolic parameters are consistent with Grade II diastolic dysfunction (pseudonormalization).  2. Right ventricular systolic function is moderately reduced. The right ventricular size is not well visualized.  3. The mitral valve is normal in structure. No evidence of mitral valve regurgitation.  4. The aortic valve is grossly normal. Aortic valve regurgitation is not visualized.  5. The inferior vena cava is normal in size with <50% respiratory variability, suggesting right atrial pressure of 8 mmHg. FINDINGS  Left Ventricle: Left ventricular ejection fraction, by estimation, is 25 to 30%. The left ventricle has severely decreased function. The left ventricle demonstrates global hypokinesis. Definity contrast agent was given IV to delineate the left ventricular endocardial borders. The left ventricular internal cavity size was normal in size. There is no left  ventricular hypertrophy. Left ventricular diastolic parameters are consistent with Grade II diastolic dysfunction (pseudonormalization). Right Ventricle: The right ventricular size is not well visualized. No increase in right ventricular wall thickness. Right ventricular systolic function is moderately reduced. Left Atrium: Left atrial size was normal in size. Right Atrium: Right atrial size was not well visualized. Pericardium: There is no evidence of pericardial effusion. Mitral Valve: The mitral valve is normal in structure. No evidence of mitral valve regurgitation. Tricuspid Valve: The tricuspid valve is not well visualized. Tricuspid valve regurgitation is not demonstrated. Aortic Valve: The aortic valve is grossly normal. Aortic valve regurgitation is not visualized. Pulmonic Valve: The pulmonic valve was not well visualized. Pulmonic valve regurgitation is not visualized. Aorta: The aortic root is normal in size and structure. Venous: The inferior vena cava is normal in size with less than 50% respiratory variability, suggesting right atrial pressure of 8 mmHg. IAS/Shunts: No atrial level shunt detected by color flow Doppler.  LEFT VENTRICLE PLAX 2D LVIDd:         4.95 cm   Diastology LVIDs:         3.58 cm   LV e' medial:    6.58 cm/s LV PW:         0.88 cm   LV E/e' medial:  18.6 LV IVS:        0.67 cm   LV e' lateral:   8.16 cm/s LVOT diam:     1.90 cm   LV E/e' lateral: 15.0 LV SV:         29 LV SV Index:   18 LVOT Area:     2.84 cm  RIGHT VENTRICLE             IVC RV Basal diam:  2.80 cm     IVC diam: 2.04 cm RV S prime:     10.30 cm/s TAPSE (M-mode): 1.0 cm LEFT ATRIUM             Index        RIGHT ATRIUM           Index LA diam:  3.90 cm 2.43 cm/m   RA Area:     12.30 cm LA Vol (A2C):   34.6 ml 21.54 ml/m  RA Volume:   30.40 ml  18.92 ml/m LA Vol (A4C):   41.1 ml 25.58 ml/m LA Biplane Vol: 38.1 ml 23.72 ml/m  AORTIC VALVE LVOT Vmax:   62.90 cm/s LVOT Vmean:  49.100 cm/s LVOT VTI:     0.101 m  AORTA Ao Root diam: 3.00 cm MV E velocity: 122.33 cm/s                             SHUNTS                             Systemic VTI:  0.10 m                             Systemic Diam: 1.90 cm Kate Sable MD Electronically signed by Kate Sable MD Signature Date/Time: 09/11/2021/7:30:13 PM    Final     Assessment and Plan  Jordan Chan is a 71 year old female with a past medical history significant for COPD requiring 2 L supplemental O2 at nighttime, HFrEF (last EF 45%) hypertension, hyperlipidemia, VTE, diabetes mellitus, breast cancer who presented to [facility] ED on 09/11/2021 due to complaints of progressive shortness of breath and generalized malaise/fatigue, pleuritic chest pain, and nausea/vomiting  acute on chronic hypoxic respiratory failure secondary to COPD exacerbation residual multifocal pneumonia recent meta-pneumo virus infection -- patient currently on room air. Sats 92--94%. -- Switch to oral antibiotic -- Pro calcitonin times two negative -- no fever -- patient is dry cough -- white count 13.8 -- respiratory viral panel this admission negative -- blood culture negative -- CT chest no PE. Residual multifocal pneumonia -- sepsis resolved -- PRN bronchodilators  a fib with RVR -- patient currently in sinus rhythm -- followed by [facility] cardiology. Discussed with Dr. Rockey Situ patient was on IV amiodarone drip will switch to 400 mg BID for now. -- Continue IV heparin drip-- defer chronic anticoagulation to cardiology --2/10-- change to PO amiodarone per cardiology-Now on oral amiodarone 400 mg bid for 1 week, followed by 200 mg bid for 1 week, followed by 200 mg daily thereafter to complete a 10 gram load -- started on PO eliquis five minute milligram BId -- DC heparin drip  Chronic congestive heart failure systolic with stress induced cardiomyopathy -- patient euvolemic -- appreciate cardiology input. She will need outpatient ischemic evaluation  electrolyte  abnormality -- replete with pharmacy assistance  generalized weakness and deconditioning -- PT OT to see patient  dysphagia -- seen by speech therapy. Dietary recommendations made. Patient has history of esophageal dilatation many years ago. Patient recommended to follow-up with PCP for referral to G.I. as outpatient.  TOC for discharge planning-- home health  Procedures: Family communication : ex-husband at bedside Consults : [facility] cardiology CODE STATUS: full DVT Prophylaxis : eliquis Level of care: Telemetry Medical Status is: Inpatient Remains inpatient appropriate because: a fib with RVR, respiratory failure.  Anticipate discharge 1 to 2 days         TOTAL TIME TAKING CARE OF THIS PATIENT: 35 minutes.  >50% time spent on counselling and coordination of care  Note: This dictation was prepared with Dragon dictation along with smaller phrase technology. Any transcriptional errors that result from this process  are unintentional.  Fritzi Mandes M.D    [facility]   CC: Primary care physician; [name], NP

## 2021-09-13 NOTE — Evaluation (Addendum)
Clinical/Bedside Swallow Evaluation Patient Details  Name: Jordan Chan MRN: [id number] Date of Birth: 03/29/1951  Today's Date: 09/13/2021 Time: SLP Start Time (ACUTE ONLY): 81 SLP Stop Time (ACUTE ONLY): 1400 SLP Time Calculation (min) (ACUTE ONLY): 60 min  Past Medical History:  Past Medical History:  Diagnosis Date   Arthritis    Asthma    Breast cancer ([place]) 1998   right breast ca with mastectomy and chemotherapy and radiation   Bronchitis    CHF (congestive heart failure) ([place])    "with Morphine withdrawal"   COPD (chronic obstructive pulmonary disease) ([place])    Diabetes mellitus without complication ([place])    Diverticulitis    diverticulosis also   Dyspnea    Endometriosis    GERD (gastroesophageal reflux disease)    History of shingles 2000-2005   Hypercholesteremia    Hypertension    IBS (irritable bowel syndrome)    Low back pain    a. Implanted morphine/bupivicaine/clonidine pump.   Neuropathy    Orthopnea    Oxygen dependent    uses at night   Personal history of chemotherapy    Personal history of radiation therapy    Pneumonia    pneumonia 5-6 times, history of bronchitis also   Scoliosis    Sleep apnea    does not use cpap   Stroke ([place]) 2010   TIA, 10 years ago   Withdrawal from sedative drug ([place])    withdrawal from morphine when pump batteries died   Past Surgical History:  Past Surgical History:  Procedure Laterality Date   ABDOMINAL HYSTERECTOMY  1987   BACK SURGERY     Tailbone removed following fracture   BREAST SURGERY Right    mastectomy   CARDIAC CATHETERIZATION     CATARACT EXTRACTION W/PHACO Right 05/19/2019   Procedure: CATARACT EXTRACTION PHACO AND INTRAOCULAR LENS PLACEMENT ([place]), RIGHT, DIABETIC;  Surgeon: Marchia Meiers, MD;  Location: [facility] ORS;  Service: Ophthalmology;  Laterality: Right;  Lot # [id number] H [place]: 00:43.8 CDE: 4.59   CATARACT EXTRACTION W/PHACO Left 06/16/2019   Procedure: CATARACT EXTRACTION PHACO AND  INTRAOCULAR LENS PLACEMENT ([place]) LEFT VISION BLUE DIABETIC;  Surgeon: Marchia Meiers, MD;  Location: [facility] ORS;  Service: Ophthalmology;  Laterality: Left;  Lot #[id number] H [place]: 00:46.9 CDE: 6.53   COCCYX REMOVAL     COLONOSCOPY WITH PROPOFOL N/A 01/01/2017   Procedure: COLONOSCOPY WITH PROPOFOL;  Surgeon: Jonathon Bellows, MD;  Location: [facility] ENDOSCOPY;  Service: Endoscopy;  Laterality: N/A;   ELBOW ARTHROSCOPY WITH TENDON RECONSTRUCTION     ESOPHAGOGASTRODUODENOSCOPY (EGD) WITH PROPOFOL N/A 01/01/2017   Procedure: ESOPHAGOGASTRODUODENOSCOPY (EGD) WITH PROPOFOL;  Surgeon: Jonathon Bellows, MD;  Location: [facility] ENDOSCOPY;  Service: Endoscopy;  Laterality: N/A;   INTRATHECAL PUMP IMPLANT     LEFT HEART CATH AND CORONARY ANGIOGRAPHY N/A 04/28/2017   Procedure: LEFT HEART CATH AND CORONARY ANGIOGRAPHY;  Surgeon: [name], MD;  Location: [place] CV LAB;  Service: Cardiovascular;  Laterality: N/A;   MASTECTOMY Right 06/1997   morphine pump  2011   PLANTAR FASCIA RELEASE     TRIGGER FINGER RELEASE     HPI:  20 YOF p/w SOB/cough and d/v x2 prior to arrival. PMH of COPD, DM, BrCA s/p chemo, CHF, GERD, HTN, O2 dependent at night, and recurrent history of pna (most recently 1 month ago primarily in the left lobe).  She c/o progressive shortness of breath and generalized malaise/fatigue, pleuritic chest pain, and nausea/vomiting.  CT of Chest: Multifocal pneumonia, with slight improvement in the  patchy  bilateral airspace opacities since recent CT on 09/05/2021.   OF NOTE: Pt reported h/o Esophageal Stricture and Dilation several years ago.  She endorsed her complaints were similar to the "feelings" she had then w/ Esophageal Dysmotility dx'd.    Assessment / Plan / Recommendation  Clinical Impression  Pt appears to present w/ adequate oropharyngeal phase swallow function (as noted at previous BSE last admit) w/ No oropharyngeal phase dysphagia noted, No neuromuscular deficits noted. Pt consumed po trials w/  No overt, clinical s/s of aspiration during po trials. Pt appears at reduced risk for aspiration from an oropharyngeal phase standpoint following general aspiration precautions.  HOWEVER, pt demonstrates, and endorses, s/s and c/o Esophageal phase Dysmotility w/ Regurgitation. She has a h/o Esophageal Stricture which required Dilation several years ago, per pt. Pt has a baseline of GERD/REFLUX. ANY Esophageal phase Dysmotility or Regurgitation of Reflux material can increase risk for aspiration of the Reflux material during Retrograde flow thus impact Voicing and Pulmonary status, including pneumonia. Pt described ongoing issues w/ solids "laying in my chest" pointing to sternal notch area and below then described the discomfort moves superiorly pointing to her throat. She feels this impacts her ability to take full meals; only eating small amounts and soups. She denied any deficits or difficulty swallowing thin liquids or purees.   Pt sat upright in bed and fed self trials of thin liquids, then tsps of puree and bolus of soft solid w/ no immediate, overt clinical s/s of aspiration noted; clear vocal quality b/t trials, no cough, no decline in pulmonary status, no multiple swallows noted post initial pharyngeal swallow. Oral phase appeared [facility] for bolus management and timely A-P transfer/clearing of material. OM exam was [facility] for oral clearing; lingual/labial movements. No unilateral weakness. Speech clear; min gravely vocal quality.  Of Note, pt demonstrated fairly quick s/s of Esophageal irritation -- felt a globus feeling post swallowing soft solid bolus(meat/bread) pointing to sternal notch area. She alternated w/ sip of liquid and gave Time for it to clear.   Recommend continue a more Mech Soft consistency diet w/ moistened, small-cut foods; thin liquids. LESS meats and breads in diet; general aspiration precautions. Rest Breaks during meals/oral intake to allow for Esophageal clearing. REFLUX precautions  strongly recommended to lessen chance for regurgitation. Discussed foods/consistencies; f/u w/ Dietician for nutritional support and supplement drinks; aspiration and REFLUX precautions; pills in puree if needed.  Strongly recommend pt f/u w/ GI when able to(post acute illness) for further assessment and management of ESOPHAGEAL DYSMOTILITY and GERD/Reflux; tx as indicated. SLP Visit Diagnosis: Dysphagia, unspecified (R13.10) (Esophageal phase Dysmotility identified)    Aspiration Risk   (reduced from an oropharyngeal phase standpoint)    Diet Recommendation   Mech Soft consistency diet w/ moistened, small-cut foods; thin liquids. LESS meats and breads in diet; general aspiration precautions. Rest Breaks during meals/oral intake to allow for Esophageal clearing. REFLUX precautions strongly recommended to lessen chance for regurgitation. F/u w/ Dietician for support.  Medication Administration: Whole meds with liquid (as tolerates vs whole in a puree)    Other  Recommendations Recommended Consults: Consider GI evaluation;Consider esophageal assessment (potential Esophageal Dysmotility; r/o Dilation need) Oral Care Recommendations: Oral care BID;Oral care before and after PO;Patient independent with oral care (Denture care) Other Recommendations:  (n/a)    Recommendations for follow up therapy are one component of a multi-disciplinary discharge planning process, led by the attending physician.  Recommendations may be updated based on patient status, additional functional  criteria and insurance authorization.  Follow up Recommendations No SLP follow up      Assistance Recommended at Discharge  (f/u w/ GI when able)  Functional Status Assessment Patient has had a recent decline in their functional status and/or demonstrates limited ability to make significant improvements in function in a reasonable and predictable amount of time (until f/u w/ GI when able)  Frequency and Duration  (n/a)    (n/a)       Prognosis Prognosis for Safe Diet Advancement: Good Barriers to Reach Goals: Severity of deficits;Time post onset Barriers/Prognosis Comment: potential Esophageal phase Dysmotility      Swallow Study   General Date of Onset: 09/11/21 HPI: 66 YOF p/w SOB/cough and d/v x2 prior to arrival. PMH of COPD, DM, BrCA s/p chemo, CHF, GERD, HTN, O2 dependent at night, and recurrent history of pna (most recently 1 month ago primarily in the left lobe).  She c/o progressive shortness of breath and generalized malaise/fatigue, pleuritic chest pain, and nausea/vomiting.  CT of Chest: Multifocal pneumonia, with slight improvement in the patchy  bilateral airspace opacities since recent CT on 09/05/2021.   OF NOTE: Pt reported h/o Esophageal Stricture and Dilation several years ago.  She endorsed her complaints were similar to the "feelings" she had then w/ Esophageal Dysmotility dx'd. Type of Study: Bedside Swallow Evaluation Previous Swallow Assessment: last admit - 09/06/2021 Diet Prior to this Study: Regular;Thin liquids Temperature Spikes Noted: No (wbc 14.1) Respiratory Status: Nasal cannula (2L) History of Recent Intubation: No Behavior/Cognition: Alert;Cooperative;Pleasant mood;Distractible;Requires cueing Oral Cavity Assessment: Within Functional Limits Oral Care Completed by SLP: Recent completion by staff Oral Cavity - Dentition: Dentures, top Vision: Functional for self-feeding Self-Feeding Abilities: Able to feed self;Needs set up Patient Positioning: Upright in bed Baseline Vocal Quality:  (min gravely but adequate; smoking history) Volitional Cough: Strong Volitional Swallow: Able to elicit    Oral/Motor/Sensory Function Overall Oral Motor/Sensory Function: Within functional limits   Ice Chips Ice chips: Not tested   Thin Liquid Thin Liquid: Within functional limits Presentation: Self Fed;Straw (10 trials) Other Comments: including multiple sips    Nectar Thick Nectar  Thick Liquid: Not tested   Honey Thick Honey Thick Liquid: Not tested   Puree Puree: Within functional limits Presentation: Self Fed;Spoon (4 oz)   Solid     Solid: Impaired Presentation: Self Fed (1 bolus) Oral Phase Impairments:  (none) Pharyngeal Phase Impairments:  (none) Other Comments: c/o "it laying right here" pointing to her sternal notch area and below.        Orinda Kenner, MS, CCC-SLP Speech Language Pathologist Rehab Services; [place] [phone] (ascom) Pericles Carmicheal 09/13/2021,5:00 PM

## 2021-09-14 LAB — BASIC METABOLIC PANEL
Anion gap: 6 (ref 5–15)
BUN: 10 mg/dL (ref 8–23)
CO2: 31 mmol/L (ref 22–32)
Calcium: 9.3 mg/dL (ref 8.9–10.3)
Chloride: 100 mmol/L (ref 98–111)
Creatinine, Ser: 0.78 mg/dL (ref 0.44–1.00)
GFR, Estimated: >60 mL/min (ref >60)
Glucose, Bld: 203 mg/dL — ABNORMAL HIGH (ref 70–99)
Potassium: 4.4 mmol/L (ref 3.5–5.1)
Sodium: 137 mmol/L (ref 135–145)

## 2021-09-14 LAB — MAGNESIUM: Magnesium: 2.1 mg/dL (ref 1.7–2.4)

## 2021-09-14 LAB — GLUCOSE, CAPILLARY: Glucose-Capillary: 175 mg/dL — ABNORMAL HIGH (ref 70–99)

## 2021-09-14 MED ORDER — CEFUROXIME AXETIL 500 MG PO TABS: 500.0000 mg | ORAL_TABLET | Freq: Two times a day (BID) | ORAL | 0 refills | Status: AC

## 2021-09-14 MED ORDER — METFORMIN HCL 500 MG PO TABS: 1000.0000 mg | ORAL_TABLET | Freq: Two times a day (BID) | ORAL | Status: DC

## 2021-09-14 MED ORDER — APIXABAN 5 MG PO TABS: 5.0000 mg | ORAL_TABLET | Freq: Two times a day (BID) | ORAL | 2 refills | Status: DC

## 2021-09-14 MED ORDER — INSULIN GLARGINE-YFGN 100 UNIT/ML ~~LOC~~ SOLN: 20.0000 [IU] | Freq: Every day | SUBCUTANEOUS | Status: DC

## 2021-09-14 MED ORDER — AMIODARONE HCL 200 MG PO TABS: 400.0000 mg | ORAL_TABLET | Freq: Two times a day (BID) | ORAL | 2 refills | Status: DC

## 2021-09-14 MED ORDER — PANTOPRAZOLE SODIUM 40 MG PO TBEC: 40.0000 mg | DELAYED_RELEASE_TABLET | Freq: Every day | ORAL | 0 refills | Status: DC

## 2021-09-14 MED ORDER — DEXTROMETHORPHAN POLISTIREX ER 30 MG/5ML PO SUER: 15.0000 mg | Freq: Two times a day (BID) | ORAL | 0 refills | Status: DC

## 2021-09-14 NOTE — Consult Note (Signed)
PHARMACY CONSULT NOTE  Pharmacy Consult for Electrolyte Monitoring and Replacement   Recent Labs: Potassium (mmol/L)  Date Value  09/14/2021 4.4  09/14/2013 3.5   Magnesium (mg/dL)  Date Value  09/14/2021 2.1   Calcium (mg/dL)  Date Value  09/14/2021 9.3   Calcium, Total (mg/dL)  Date Value  09/14/2013 9.0   Albumin (g/dL)  Date Value  09/11/2021 3.2 (L)  04/29/2021 4.2  09/14/2013 3.2 (L)   Phosphorus (mg/dL)  Date Value  09/12/2021 2.6   Sodium (mmol/L)  Date Value  09/14/2021 137  07/31/2021 141  09/14/2013 133 (L)   Assessment: Patient is a 71 y/o F with medical history including HFrEF, HTN, SVT, history of PE, T2DM, GERD, COPD, depression, CKD who is admitted with new-onset Afib with RVR in the setting of multifocal pneumonia. Pharmacy consulted to assist with electrolyte monitoring and replacement as indicated.   Goal of Therapy:  K 4 Mg 2 All other electrolytes within normal limits  Plan:  No further supplementation needed Follow-up electrolytes with AM labs tomorrow  Pearla Dubonnet, PharmD 09/14/2021 10:00 AM

## 2021-09-14 NOTE — Discharge Summary (Signed)
Physician Discharge Summary   Patient: Jordan Chan MRN: [id number] DOB: Jan 17, 1951  Admit date:     09/11/2021  Discharge date: 09/14/21  Discharge Physician: Fritzi Mandes   PCP: [name], NP   Recommendations at discharge:   patient to use her inhalers and nebulizer as before follow-up [facility] cardiology on the appointment that will be called to you. Follow-up with your primary care physician in 1 to 2 weeks patient will need PCP referral for G.I. evaluation for dysphagia/rule out esophageal dismotility.  Discharge Diagnoses: Acute on chronic hypoxic respiratory failure secondary to COPD exacerbation improved Residual multifocal pneumonia A fib with RVR-- you   Hospital Course:  Jordan Chan is a 71 year old female with a past medical history significant for COPD requiring 2 L supplemental O2 at nighttime, HFrEF (last EF 45%) hypertension, hyperlipidemia, VTE, diabetes mellitus, breast cancer who presented to [facility] ED on 09/11/2021 due to complaints of progressive shortness of breath and generalized malaise/fatigue, pleuritic chest pain, and nausea/vomiting   acute on chronic hypoxic respiratory failure secondary to COPD exacerbation--hypoxia improved residual multifocal pneumonia recent meta-pneumo virus infection -- patient currently on room air. Sats 92--94%. -- Switch to oral antibiotic -- Pro calcitonin times two negative -- no fever -- patient is dry cough -- white count 13.8 -- respiratory viral panel this admission negative -- blood culture negative -- CT chest no PE. Residual multifocal pneumonia -- sepsis resolved -- PRN bronchodilators --sats >92% ON RA   a fib with RVR -- patient currently in sinus rhythm -- followed by [facility] cardiology. Discussed with Dr. Rockey Situ patient was on IV amiodarone drip will switch to 400 mg BID for now. -- Continue IV heparin drip-- defer chronic anticoagulation to cardiology --2/10-- change to PO amiodarone per  cardiology-Now on oral amiodarone 400 mg bid for 1 week, followed by 200 mg bid for 1 week, followed by 200 mg daily thereafter to complete a 10 gram load -- started on PO eliquis 5 mg bid -- DC heparin drip   Chronic congestive heart failure systolic with stress induced cardiomyopathy -- patient euvolemic -- appreciate cardiology input. She will need outpatient ischemic evaluation   generalized weakness and deconditioning -- PT OT to see patient--pt is ambulating by herself. She does not want PT   dysphagia -- seen by speech therapy. Dietary recommendations made. Patient has history of esophageal dilatation many years ago. Patient recommended to follow-up with PCP for referral to G.I. as outpatient.   Overall hemodynamically stable. Will discharge patient to home. Discharge instructions given. Patient in agreement.    Family communication : none today Consults : [facility] cardiology CODE STATUS: full DVT Prophylaxis : eliquis Level of care: Telemetry Medical Status is: Inpatient             Disposition: Home Diet recommendation:  Discharge Diet Orders (From admission, onward)     Start     Ordered   09/14/21 0000  Diet - low sodium heart healthy        09/14/21 1113           Cardiac and Carb modified diet  DISCHARGE MEDICATION: Allergies as of 09/14/2021       Reactions   Other Palpitations   IV steroids   Pain Patch [menthol] Anaphylaxis   Avelox [moxifloxacin Hcl In Nacl] Other (See Comments)   Upset stomach   Doxycycline Diarrhea   Erythromycin Nausea Only, Other (See Comments)   Can take a Z-Pak just fine Other reaction(s):  Other (See Comments) Can take Z-Pak Can take a Z-Pak just fine   Fentanyl Nausea Only, Rash   Moxifloxacin Hcl Other (See Comments)   Upset stomach Upset stomach   Oxycontin [oxycodone] Hives   Ozempic [semaglutide] Nausea Only        Medication List     STOP taking these medications    predniSONE 20 MG tablet Commonly  known as: DELTASONE       TAKE these medications    Accu-Chek Aviva Plus test strip Generic drug: glucose blood TEST THREE TIMES DAILY   Accu-Chek Softclix Lancets lancets TEST BLOOD SUGAR THREE TIMES DAILY   albuterol 108 (90 Base) MCG/ACT inhaler Commonly known as: VENTOLIN HFA Inhale 2 puffs into the lungs every 6 (six) hours as needed for wheezing or shortness of breath.   amiodarone 200 MG tablet Commonly known as: PACERONE Take 2 tablets (400 mg total) by mouth 2 (two) times daily. Then from 09/19/21 take 200 mg twice a day and  From 09/26/21 take 200 mg daily   apixaban 5 MG Tabs tablet Commonly known as: ELIQUIS Take 1 tablet (5 mg total) by mouth 2 (two) times daily.   atorvastatin 20 MG tablet Commonly known as: LIPITOR Take 1 tablet (20 mg total) by mouth daily.   B-D SINGLE USE SWABS REGULAR Pads USE TWICE DAILY  WITH  SUGAR  CHECKS   cefUROXime 500 MG tablet Commonly known as: CEFTIN Take 1 tablet (500 mg total) by mouth 2 (two) times daily with a meal for 4 days.   cholecalciferol 25 MCG (1000 UNIT) tablet Commonly known as: VITAMIN D3 Take 1,000 Units by mouth daily.   Cyanocobalamin 1000 MCG/ML Kit Inject 1,000 mcg as directed every 30 (thirty) days.   cyclobenzaprine 5 MG tablet Commonly known as: FLEXERIL Take 5 mg by mouth 2 (two) times daily as needed for muscle spasms. Takes very rarely   dextromethorphan 30 MG/5ML liquid Commonly known as: DELSYM Take 2.5 mLs (15 mg total) by mouth 2 (two) times daily.   diclofenac 50 MG EC tablet Commonly known as: VOLTAREN TAKE 1 TABLET THREE TIMES DAILY   folic acid 1 MG tablet Commonly known as: FOLVITE TAKE 1 TABLET EVERY DAY   furosemide 40 MG tablet Commonly known as: LASIX TAKE 1 TABLET EVERY DAY   gabapentin 800 MG tablet Commonly known as: NEURONTIN Take 1 tablet (800 mg total) by mouth 3 (three) times daily.   lactulose 10 GM/15ML solution Commonly known as: CHRONULAC Take 45 mLs  (30 g total) by mouth daily as needed for mild constipation or severe constipation.   metFORMIN 1000 MG tablet Commonly known as: GLUCOPHAGE Take 1 tablet (1,000 mg total) by mouth 2 (two) times daily with a meal.   metoprolol succinate 25 MG 24 hr tablet Commonly known as: TOPROL-XL TAKE 1/2 TABLET EVERY DAY   montelukast 10 MG tablet Commonly known as: SINGULAIR Take 10 mg by mouth at bedtime.   PAIN MANAGEMENT INTRATHECAL (IT) PUMP 1 each by Intrathecal route. Intrathecal (IT) medication:  Morphine Patient does not remember current. Adjusted every 2.5 months at [facility] in [place].   pantoprazole 40 MG tablet Commonly known as: PROTONIX Take 1 tablet (40 mg total) by mouth daily. Start taking on: September 15, 2021   rOPINIRole 0.25 MG tablet Commonly known as: Requip Take 1 tablet (0.25 mg total) by mouth at bedtime.   Trelegy Ellipta 100-62.5-25 MCG/ACT Aepb Generic drug: Fluticasone-Umeclidin-Vilant INHALE 1 PUFF INTO THE LUNGS  DAILY.   Tyler Aas FlexTouch 100 UNIT/ML FlexTouch Pen Generic drug: insulin degludec INJECT 30 UNITS INTO THE SKIN AT BEDTIME What changed: See the new instructions.   True Metrix Meter w/Device Kit Use to check blood sugar 4 times a day   Trulicity 4.5 EX/9.3ZJ Sopn Generic drug: Dulaglutide Inject 4.5 mg as directed once a week. What changed: additional instructions   venlafaxine XR 150 MG 24 hr capsule Commonly known as: EFFEXOR-XR Take 1 capsule (150 mg total) by mouth daily.        Follow-up Information     [name], NP. Schedule an appointment as soon as possible for a visit in 1 week(s).   Specialty: Nurse Practitioner Why: hosp f/u Contact information: [place] [address] [phone]         [name], MD. Schedule an appointment as soon as possible for a visit in 1 week(s).   Specialty: Cardiology Why: afib f/u Contact information: [address]  [place] [address] [phone]                 Discharge Exam: Danley Danker Weights   09/11/21 2040 09/12/21 0500 09/14/21 0500  Weight: 69.7 kg 70.5 kg 69.1 kg     Condition at discharge: stable  The results of significant diagnostics from this hospitalization (including imaging, microbiology, ancillary and laboratory) are listed below for reference.   Imaging Studies: DG Chest 2 View  Result Date: 08/21/2021 CLINICAL DATA:  Recent pneumonia, difficulty breathing. EXAM: CHEST - 2 VIEW COMPARISON:  07/31/2021 and CT chest 12/07/2020. FINDINGS: Trachea is midline. Heart size normal. No airspace consolidation or pleural fluid. Old right rib fractures. IMPRESSION: No acute findings. Electronically Signed   By: Lorin Picket M.D.   On: 08/21/2021 14:50   CT CHEST WO CONTRAST  Result Date: 09/05/2021 CLINICAL DATA:  Shortness of breath, cough. EXAM: CT CHEST WITHOUT CONTRAST TECHNIQUE: Multidetector CT imaging of the chest was performed following the standard protocol without IV contrast. RADIATION DOSE REDUCTION: This exam was performed according to the departmental dose-optimization program which includes automated exposure control, adjustment of the mA and/or kV according to patient size and/or use of iterative reconstruction technique. COMPARISON:  Dec 07, 2020. FINDINGS: Cardiovascular: Atherosclerosis of thoracic aorta is noted without aneurysm formation. Normal cardiac size. No pericardial effusion. Mediastinum/Nodes: No enlarged mediastinal or axillary lymph nodes. Thyroid gland, trachea, and esophagus demonstrate no significant findings. Lungs/Pleura: No pneumothorax or pleural effusion is noted. Stable right apical scarring is noted. Interval development of patchy airspace opacities in the right lower and middle lobes concerning for multifocal pneumonia. Upper Abdomen: No acute abnormality. Musculoskeletal: No chest wall mass or suspicious bone lesions identified. IMPRESSION:  Multiple patchy airspace opacities are noted in the right lower and middle lobes concerning for multifocal pneumonia. Aortic Atherosclerosis (ICD10-I70.0). Electronically Signed   By: Marijo Conception M.D.   On: 09/05/2021 13:51   CT Angio Chest PE W and/or Wo Contrast  Result Date: 09/11/2021 CLINICAL DATA:  Pulmonary embolism (PE) suspected, high prob EXAM: CT ANGIOGRAPHY CHEST WITH CONTRAST TECHNIQUE: Multidetector CT imaging of the chest was performed using the standard protocol during bolus administration of intravenous contrast. Multiplanar CT image reconstructions and MIPs were obtained to evaluate the vascular anatomy. RADIATION DOSE REDUCTION: This exam was performed according to the departmental dose-optimization program which includes automated exposure control, adjustment of the mA and/or kV according to patient size and/or use of iterative reconstruction technique. CONTRAST:  90m  OMNIPAQUE IOHEXOL 350 MG/ML SOLN COMPARISON:  CT 09/05/2021 FINDINGS: Cardiovascular: Normal cardiac size.No pericardial disease.Normal size main and branch pulmonary arteries.No evidence of pulmonary embolism. Mild atherosclerosis of the aortic arch and descending aorta. Mediastinum/Nodes: No lymphadenopathy.Atrophic left thyroid.Esophagus is unremarkable.The trachea is unremarkable. Lungs/Pleura: There are patchy airspace opacities in the right upper, middle, and lower lobes which have improved since recent CT on 09/05/2021. Unchanged benign 4 mm solid nodule in the lingula (series 6, image 57) and 3 mm nodule in the superior segment of the left lower lobe (series 6, image 41), stable since October 2018. No other suspicious nodules. No pleural effusion. No pneumothorax. Unchanged partially calcified pleural chromic wall thickening in the right apex. Upper Abdomen: No acute abnormality. There are bilateral renal cysts, density measuring as simple cysts. Musculoskeletal: There is a spinal cord stimulator with lead  terminating at the level of T7. No acute osseous abnormality. No suspicious lytic or blastic lesions. Multilevel degenerative changes of the spine.No suspicious lytic or blastic lesions. Prior right mastectomy. Review of the MIP images confirms the above findings. IMPRESSION: Multifocal pneumonia, with slight improvement in the patchy bilateral airspace opacities since recent CT on 09/05/2021. Recommend follow-up chest CT in 3 months after treatment to ensure resolution. No evidence of pulmonary embolism. Aortic Atherosclerosis (ICD10-I70.0). Electronically Signed   By: Maurine Simmering M.D.   On: 09/11/2021 11:26   DG Chest Portable 1 View  Result Date: 09/11/2021 CLINICAL DATA:  Shortness of breath.  No chest pain. EXAM: PORTABLE CHEST 1 VIEW COMPARISON:  09/05/2021 FINDINGS: Bilateral interstitial thickening concerning for pneumonia including atypical viral pneumonia. No focal consolidation. No pleural effusion or pneumothorax. Heart and mediastinal contours are unremarkable. No acute osseous abnormality. IMPRESSION: 1. Bilateral interstitial thickening concerning for pneumonia including atypical viral pneumonia. Electronically Signed   By: Kathreen Devoid M.D.   On: 09/11/2021 10:01   DG Chest Portable 1 View  Result Date: 09/05/2021 CLINICAL DATA:  Cough, shortness of breath EXAM: PORTABLE CHEST 1 VIEW COMPARISON:  Previous studies including the examination of 08/21/2021 FINDINGS: Cardiac size is within normal limits. There are no signs of alveolar pulmonary edema or definite focal pulmonary consolidation. There is asymmetric increased density in the left lower lung fields which may be related to right mastectomy. Possibility of early infiltrate in the left lower lung fields is not excluded. There is no pleural effusion or pneumothorax. IMPRESSION: Asymmetric haziness in the left lower lung fields may be related to right mastectomy. Possibility of early infiltrate in the left lower lung fields is not excluded. If  clinically warranted, short-term follow-up PA and lateral views of chest may be considered. Electronically Signed   By: Elmer Picker M.D.   On: 09/05/2021 12:12   ECHOCARDIOGRAM COMPLETE  Result Date: 09/11/2021    ECHOCARDIOGRAM REPORT   Patient Name:   CALLEY DRENNING Duford Date of Exam: 09/11/2021 Medical Rec #:  [id number]          Height:       58.0 in Accession #:    [id number]         Weight:       148.9 lb Date of Birth:  01-Jul-1951          BSA:          1.606 m Patient Age:    49 years           BP:           98/72 mmHg Patient Gender: F  HR:           131 bpm. Exam Location:  [facility] Procedure: 2D Echo, Cardiac Doppler, Color Doppler and Intracardiac            Opacification Agent Indications:     I48.91 Atrial Fibrillation  History:         Patient has prior history of Echocardiogram examinations, most                  recent 12/11/2020. CHF, Stroke and COPD,                  Signs/Symptoms:Pneumonia. and Dyspnea; Risk                  Factors:Hypertension, Diabetes, Dyslipidemia and Sleep Apnea.  Sonographer:     Cresenciano Lick RDCS Referring Phys:  [id number] Bradly Bienenstock Diagnosing Phys: Kate Sable MD IMPRESSIONS  1. Left ventricular ejection fraction, by estimation, is 25 to 30%. The left ventricle has severely decreased function. The left ventricle demonstrates global hypokinesis. Left ventricular diastolic parameters are consistent with Grade II diastolic dysfunction (pseudonormalization).  2. Right ventricular systolic function is moderately reduced. The right ventricular size is not well visualized.  3. The mitral valve is normal in structure. No evidence of mitral valve regurgitation.  4. The aortic valve is grossly normal. Aortic valve regurgitation is not visualized.  5. The inferior vena cava is normal in size with <50% respiratory variability, suggesting right atrial pressure of 8 mmHg. FINDINGS  Left Ventricle: Left ventricular ejection fraction, by  estimation, is 25 to 30%. The left ventricle has severely decreased function. The left ventricle demonstrates global hypokinesis. Definity contrast agent was given IV to delineate the left ventricular endocardial borders. The left ventricular internal cavity size was normal in size. There is no left ventricular hypertrophy. Left ventricular diastolic parameters are consistent with Grade II diastolic dysfunction (pseudonormalization). Right Ventricle: The right ventricular size is not well visualized. No increase in right ventricular wall thickness. Right ventricular systolic function is moderately reduced. Left Atrium: Left atrial size was normal in size. Right Atrium: Right atrial size was not well visualized. Pericardium: There is no evidence of pericardial effusion. Mitral Valve: The mitral valve is normal in structure. No evidence of mitral valve regurgitation. Tricuspid Valve: The tricuspid valve is not well visualized. Tricuspid valve regurgitation is not demonstrated. Aortic Valve: The aortic valve is grossly normal. Aortic valve regurgitation is not visualized. Pulmonic Valve: The pulmonic valve was not well visualized. Pulmonic valve regurgitation is not visualized. Aorta: The aortic root is normal in size and structure. Venous: The inferior vena cava is normal in size with less than 50% respiratory variability, suggesting right atrial pressure of 8 mmHg. IAS/Shunts: No atrial level shunt detected by color flow Doppler.  LEFT VENTRICLE PLAX 2D LVIDd:         4.95 cm   Diastology LVIDs:         3.58 cm   LV e' medial:    6.58 cm/s LV PW:         0.88 cm   LV E/e' medial:  18.6 LV IVS:        0.67 cm   LV e' lateral:   8.16 cm/s LVOT diam:     1.90 cm   LV E/e' lateral: 15.0 LV SV:         29 LV SV Index:   18 LVOT Area:     2.84 cm  RIGHT VENTRICLE  IVC RV Basal diam:  2.80 cm     IVC diam: 2.04 cm RV S prime:     10.30 cm/s TAPSE (M-mode): 1.0 cm LEFT ATRIUM             Index        RIGHT ATRIUM            Index LA diam:        3.90 cm 2.43 cm/m   RA Area:     12.30 cm LA Vol (A2C):   34.6 ml 21.54 ml/m  RA Volume:   30.40 ml  18.92 ml/m LA Vol (A4C):   41.1 ml 25.58 ml/m LA Biplane Vol: 38.1 ml 23.72 ml/m  AORTIC VALVE LVOT Vmax:   62.90 cm/s LVOT Vmean:  49.100 cm/s LVOT VTI:    0.101 m  AORTA Ao Root diam: 3.00 cm MV E velocity: 122.33 cm/s                             SHUNTS                             Systemic VTI:  0.10 m                             Systemic Diam: 1.90 cm Kate Sable MD Electronically signed by Kate Sable MD Signature Date/Time: 09/11/2021/7:30:13 PM    Final     Microbiology: Results for orders placed or performed during the hospital encounter of 09/11/21  Blood culture (routine x 2)     Status: None (Preliminary result)   Collection Time: 09/11/21 12:06 PM   Specimen: BLOOD  Result Value Ref Range Status   Specimen Description BLOOD LEFT WRIST  Final   Special Requests   Final    BOTTLES DRAWN AEROBIC AND ANAEROBIC Blood Culture results may not be optimal due to an inadequate volume of blood received in culture bottles   Culture   Final    NO GROWTH 3 DAYS Performed at [facility], [address] [place], [place] [address]    Report Status PENDING  Incomplete  Blood culture (routine x 2)     Status: None (Preliminary result)   Collection Time: 09/11/21 12:06 PM   Specimen: BLOOD  Result Value Ref Range Status   Specimen Description BLOOD RIGHT ANTECUBITAL  Final   Special Requests   Final    BOTTLES DRAWN AEROBIC AND ANAEROBIC Blood Culture adequate volume   Culture   Final    NO GROWTH 3 DAYS Performed at [facility], [address] [place],  [address]    Report Status PENDING  Incomplete  Resp Panel by RT-PCR (Flu A&B, Covid) Nasopharyngeal Swab     Status: None   Collection Time: 09/11/21  1:30 PM   Specimen: Nasopharyngeal Swab; Nasopharyngeal(NP) swabs in vial transport medium  Result Value Ref Range  Status   SARS Coronavirus 2 by RT PCR NEGATIVE NEGATIVE Final    Comment: (NOTE) SARS-CoV-2 target nucleic acids are NOT DETECTED.  The SARS-CoV-2 RNA is generally detectable in upper respiratory specimens during the acute phase of infection. The lowest concentration of SARS-CoV-2 viral copies this assay can detect is 138 copies/mL. A negative result does not preclude SARS-Cov-2 infection and should not be used as the sole basis for treatment or other patient management  decisions. A negative result may occur with  improper specimen collection/handling, submission of specimen other than nasopharyngeal swab, presence of viral mutation(s) within the areas targeted by this assay, and inadequate number of viral copies(<138 copies/mL). A negative result must be combined with clinical observations, patient history, and epidemiological information. The expected result is Negative.  Fact Sheet for Patients:  EntrepreneurPulse.com.au  Fact Sheet for Healthcare Providers:  IncredibleEmployment.be  This test is no t yet approved or cleared by the [place] FDA and  has been authorized for detection and/or diagnosis of SARS-CoV-2 by FDA under an Emergency Use Authorization (EUA). This EUA will remain  in effect (meaning this test can be used) for the duration of the COVID-19 declaration under Section 564(b)(1) of the Act, 21 U.S.C.section 360bbb-3(b)(1), unless the authorization is terminated  or revoked sooner.       Influenza A by PCR NEGATIVE NEGATIVE Final   Influenza B by PCR NEGATIVE NEGATIVE Final    Comment: (NOTE) The Xpert Xpress SARS-CoV-2/FLU/RSV plus assay is intended as an aid in the diagnosis of influenza from Nasopharyngeal swab specimens and should not be used as a sole basis for treatment. Nasal washings and aspirates are unacceptable for Xpert Xpress SARS-CoV-2/FLU/RSV testing.  Fact Sheet for  Patients: EntrepreneurPulse.com.au  Fact Sheet for Healthcare Providers: IncredibleEmployment.be  This test is not yet approved or cleared by the [place] FDA and has been authorized for detection and/or diagnosis of SARS-CoV-2 by FDA under an Emergency Use Authorization (EUA). This EUA will remain in effect (meaning this test can be used) for the duration of the COVID-19 declaration under Section 564(b)(1) of the Act, 21 U.S.C. section 360bbb-3(b)(1), unless the authorization is terminated or revoked.  Performed at [facility], [place], [place] [address]   Respiratory (~20 pathogens) panel by PCR     Status: None   Collection Time: 09/11/21  1:30 PM   Specimen: Nasopharyngeal Swab; Respiratory  Result Value Ref Range Status   Adenovirus NOT DETECTED NOT DETECTED Final   Coronavirus 229E NOT DETECTED NOT DETECTED Final    Comment: (NOTE) The Coronavirus on the Respiratory Panel, DOES NOT test for the novel  Coronavirus (2019 nCoV)    Coronavirus HKU1 NOT DETECTED NOT DETECTED Final   Coronavirus NL63 NOT DETECTED NOT DETECTED Final   Coronavirus OC43 NOT DETECTED NOT DETECTED Final   Metapneumovirus NOT DETECTED NOT DETECTED Final   Rhinovirus / Enterovirus NOT DETECTED NOT DETECTED Final   Influenza A NOT DETECTED NOT DETECTED Final   Influenza B NOT DETECTED NOT DETECTED Final   Parainfluenza Virus 1 NOT DETECTED NOT DETECTED Final   Parainfluenza Virus 2 NOT DETECTED NOT DETECTED Final   Parainfluenza Virus 3 NOT DETECTED NOT DETECTED Final   Parainfluenza Virus 4 NOT DETECTED NOT DETECTED Final   Respiratory Syncytial Virus NOT DETECTED NOT DETECTED Final   Bordetella pertussis NOT DETECTED NOT DETECTED Final   Bordetella Parapertussis NOT DETECTED NOT DETECTED Final   Chlamydophila pneumoniae NOT DETECTED NOT DETECTED Final   Mycoplasma pneumoniae NOT DETECTED NOT DETECTED Final    Comment: Performed at  [facility], [place]. [address], [place], [place] [address]  MRSA Next Gen by PCR, Nasal     Status: None   Collection Time: 09/11/21  8:48 PM   Specimen: Nasal Mucosa; Nasal Swab  Result Value Ref Range Status   MRSA by PCR Next Gen NOT DETECTED NOT DETECTED Final    Comment: (NOTE) The GeneXpert MRSA  Assay (FDA approved for NASAL specimens only), is one component of a comprehensive MRSA colonization surveillance program. It is not intended to diagnose MRSA infection nor to guide or monitor treatment for MRSA infections. Test performance is not FDA approved in patients less than 42 years old. Performed at [facility], [address] [place], [place] [address]     Labs: CBC: Recent Labs  Lab 09/08/21 [phone] 09/11/21 0933 09/12/21 0430 09/13/21 0259  WBC 15.5* 16.4* 13.8* 14.1*  HGB 11.1* 12.7 10.8* 10.7*  HCT 35.2* 40.5 34.5* 33.4*  MCV 95.7 94.2 94.8 94.9  PLT 370 481* 390 770   Basic Metabolic Panel: Recent Labs  Lab 09/08/21 0427 09/11/21 0933 09/12/21 0430 09/12/21 0906 09/13/21 0259 09/14/21 0445  NA 135 136 135  --  135 137  K 3.7 3.4* 4.3  --  4.0 4.4  CL 94* 98 101  --  101 100  CO2 28 29 27   --  28 31  GLUCOSE 237* 122* 238*  --  232* 203*  BUN 16 21 16   --  14 10  CREATININE 0.92 0.91 0.69  --  0.85 0.78  CALCIUM 9.0 8.3* 7.6*  --  8.6* 9.3  MG  --   --   --  1.4* 1.9 2.1  PHOS  --   --   --  2.6  --   --    Liver Function Tests: Recent Labs  Lab 09/11/21 0933  AST 19  ALT 16  ALKPHOS 48  BILITOT 0.6  PROT 7.0  ALBUMIN 3.2*   CBG: Recent Labs  Lab 09/13/21 0743 09/13/21 1231 09/13/21 1614 09/13/21 2012 09/14/21 0812  GLUCAP 170* 370* 368* 267* 175*    Discharge time spent: greater than 30 minutes.  Signed: Fritzi Mandes, MD [facility] 09/14/2021

## 2021-09-14 NOTE — Progress Notes (Signed)
Progress Note  Patient Name: Jordan Chan Date of Encounter: 09/14/2021  Primary Cardiologist: End  Subjective   No chest pain or dyspnea. Did not sleep well. Has a dry cough.    Inpatient Medications    Scheduled Meds:  amiodarone  400 mg Oral BID   apixaban  5 mg Oral BID   budesonide (PULMICORT) nebulizer solution  0.25 mg Nebulization BID   cefUROXime  500 mg Oral BID WC   dextromethorphan  15 mg Oral BID   gabapentin  800 mg Oral TID   insulin aspart  0-15 Units Subcutaneous TID WC   insulin aspart  0-5 Units Subcutaneous QHS   [START ON 09/15/2021] insulin glargine-yfgn  20 Units Subcutaneous Daily   levalbuterol  1.25 mg Nebulization Q6H   metoprolol succinate  12.5 mg Oral Daily   montelukast  10 mg Oral QHS   pantoprazole  40 mg Oral Daily   predniSONE  40 mg Oral Q breakfast   rOPINIRole  0.25 mg Oral QHS   venlafaxine XR  150 mg Oral Daily   Continuous Infusions:  sodium chloride     PRN Meds: acetaminophen, docusate sodium, polyethylene glycol   Vital Signs    Vitals:   09/13/21 2336 09/14/21 0419 09/14/21 0500 09/14/21 0812  BP: 120/66 (!) 125/59  (!) 116/55  Pulse: 81 81  76  Resp: 18 16  18   Temp: 98 F (36.7 C) 97.8 F (36.6 C)  98.1 F (36.7 C)  TempSrc:  Oral    SpO2: 100% 96%  98%  Weight:   69.1 kg   Height:        Intake/Output Summary (Last 24 hours) at 09/14/2021 0936 Last data filed at 09/14/2021 3903 Gross per 24 hour  Intake 2840 ml  Output --  Net 2840 ml    Filed Weights   09/11/21 2040 09/12/21 0500 09/14/21 0500  Weight: 69.7 kg 70.5 kg 69.1 kg    Telemetry    SR - Personally Reviewed  ECG    No new tracings - Personally Reviewed  Physical Exam   GEN: No acute distress.   Neck: No JVD. Cardiac: RRR, no murmurs, rubs, or gallops.  Respiratory: Coarse breath sounds bilaterally.  GI: Soft, nontender, non-distended.   MS: No edema; No deformity. Neuro:  Alert and oriented x 3; Nonfocal.  Psych:  Normal affect.  Labs    Chemistry Recent Labs  Lab 09/11/21 0933 09/12/21 0430 09/13/21 0259  NA 136 135 135  K 3.4* 4.3 4.0  CL 98 101 101  CO2 29 27 28   GLUCOSE 122* 238* 232*  BUN 21 16 14   CREATININE 0.91 0.69 0.85  CALCIUM 8.3* 7.6* 8.6*  PROT 7.0  --   --   ALBUMIN 3.2*  --   --   AST 19  --   --   ALT 16  --   --   ALKPHOS 48  --   --   BILITOT 0.6  --   --   GFRNONAA >60 >60 >60  ANIONGAP 9 7 6       Hematology Recent Labs  Lab 09/11/21 0933 09/12/21 0430 09/13/21 0259  WBC 16.4* 13.8* 14.1*  RBC 4.30 3.64* 3.52*  HGB 12.7 10.8* 10.7*  HCT 40.5 34.5* 33.4*  MCV 94.2 94.8 94.9  MCH 29.5 29.7 30.4  MCHC 31.4 31.3 32.0  RDW 13.9 14.0 13.9  PLT 481* 390 396     Cardiac EnzymesNo results for input(s): TROPONINI  in the last 168 hours. No results for input(s): TROPIPOC in the last 168 hours.   BNP Recent Labs  Lab 09/11/21 0933  BNP 494.2*      DDimer No results for input(s): DDIMER in the last 168 hours.   Radiology    CT Angio Chest PE W and/or Wo Contrast  Result Date: 09/11/2021 IMPRESSION: Multifocal pneumonia, with slight improvement in the patchy bilateral airspace opacities since recent CT on 09/05/2021. Recommend follow-up chest CT in 3 months after treatment to ensure resolution. No evidence of pulmonary embolism. Aortic Atherosclerosis (ICD10-I70.0). Electronically Signed   By: Maurine Simmering M.D.   On: 09/11/2021 11:26   DG Chest Portable 1 View  Result Date: 09/11/2021 IMPRESSION: 1. Bilateral interstitial thickening concerning for pneumonia including atypical viral pneumonia. Electronically Signed   By: Kathreen Devoid M.D.   On: 09/11/2021 10:01   Cardiac Studies   2D echo 09/11/2021: 1. Left ventricular ejection fraction, by estimation, is 25 to 30%. The  left ventricle has severely decreased function. The left ventricle  demonstrates global hypokinesis. Left ventricular diastolic parameters are  consistent with Grade II diastolic   dysfunction (pseudonormalization).   2. Right ventricular systolic function is moderately reduced. The right  ventricular size is not well visualized.   3. The mitral valve is normal in structure. No evidence of mitral valve  regurgitation.   4. The aortic valve is grossly normal. Aortic valve regurgitation is not  visualized.   5. The inferior vena cava is normal in size with <50% respiratory  variability, suggesting right atrial pressure of 8 mmHg. __________  2D echo 12/11/2020: 1. Left ventricular ejection fraction, by estimation, is 45%. The left  ventricle has mildly decreased function. The left ventricle demonstrates  global hypokinesis. Left ventricular diastolic parameters are consistent  with Grade II diastolic dysfunction  (pseudonormalization).   2. Right ventricular systolic function is normal. The right ventricular  size is normal.   3. The mitral valve is normal in structure. No evidence of mitral valve  regurgitation.   4. The aortic valve is tricuspid. Aortic valve regurgitation is not  visualized.   Comparison(s): EF 45-50%. __________  LHC 04/28/2017: Conclusions: No angiographically significant coronary artery disease. Severely reduced LV contraction (LVEF 25-30%) with mid and apical akinesis, consistent with Takotsubo (stress-induced) cardiomyopathy. Mildly elevated left ventricular filling pressure.   Recommendations: Medical therapy, including initiation of metoprolol succinate 12.5 mg daily and furosemide 20 mg IV daily. If blood pressure tolerates, consider addition of low-dose ACE inhibitor tomorrow. Continue treatment of pain and opioid withdrawal.  Patient Profile     71 y.o. female with history of stress-induced cardiomyopathy in 04/2017 in the setting of pain pump malfunction and opioid withdrawal, PE following hospitalization for PNA in the summer of 2021 treated with Eliquis, anemia/leukocytosis/thrombocytosis followed by hematology, chronic low  back pain, DM, COPD, sleep apnea intolerant to CPAP on nocturnal oxygen, IBS, and GERD who was admitted with acute on chronic hypoxic respiratory failure and septic shock requiring vasopressor support with Levophed in the setting of PNA and acute copd exacerbation who we are seeing for Afib with RVR.  Assessment & Plan    1. Afib with RVR: -New onset -Likely in the setting of her acute illness -Converted to sinus rhythm at 20:45 on 2/8, and has maintained sinus since, following IV digoxin loading with a digoxin level of 2.4 on 2/8, improved to 0.6 on 2/10 off digoxin, she did not need Digibind -Avoid further digoxin at this  time -Now on oral amiodarone 400 mg bid for 1 week (2/16), followed by 200 mg bid for 1 week (2/23), followed by 200 mg daily thereafter to complete a 10 gram load -Continue Toprol XL, BP has precluded escalation  -Continue Eliquis 5 mg bid -CHADS2VASc at least 5 (CHF, age x 1, DM, vascular disease, sex category) -TSH mildly elevated with normal total T4 and free thyroxine   2. HFrEF with history of stress-induced cardiomyopathy: -She appears largely euvolemic -Toprol XL -Relative hypotension has precluded escalation of GDMT at this time, escalate as able -Previously noted to have hyperkalemia on ACEi leading this medication to be stopped, in this setting would defer ACEi/ARB/ARNI/MRA -Consider tachy-mediated or stress-induced cardiomyopathy   -LHC in 2018 was without evidence of obstructive CAD -Repeat limited echo as an outpatient, once her illness is improved  -Will need outpatient ischemic evaluation   3. Acute on chronic hypoxic respiratory failure with septic shock secondary to PNA: -Improving -No longer on Levophed -Per CCM  4. Hypomagnesemia: -Repleted  5. Hypokalemia: -Repleted    She is stable for discharge from a cardiac perspective on current medications once she has been staffed by MD. I will arrange follow up in our office.      For questions  or updates, please contact [place] Please consult [email or web address] for contact info under Cardiology/STEMI.    Signed, [name], PA-C [place] Pager: [phone] 09/14/2021, 9:36 AM

## 2021-09-15 NOTE — Patient Instructions (Incomplete)
Community-Acquired Pneumonia, Adult °Pneumonia is an infection of the lungs. It causes irritation and swelling in the airways of the lungs. Mucus and fluid may also build up inside the airways. This may cause coughing and trouble breathing. °One type of pneumonia can happen while you are in a hospital. A different type can happen when you are not in a hospital (community-acquired pneumonia). °What are the causes? °This condition is caused by germs (viruses, bacteria, or fungi). Some types of germs can spread from person to person. Pneumonia is not thought to spread from person to person. °What increases the risk? °You are more likely to develop this condition if: °You have a long-term (chronic) disease, such as: °Disease of the lungs. This may be chronic obstructive pulmonary disease (COPD) or asthma. °Heart failure. °Cystic fibrosis. °Diabetes. °Kidney disease. °Sickle cell disease. °HIV. °You have other health problems, such as: °Your body's defense system (immune system) is weak. °A condition that may cause you to breathe in fluids from your mouth and nose. °You had your spleen taken out. °You do not take good care of your teeth and mouth (poor dental hygiene). °You use or have used tobacco products. °You travel where the germs that cause this illness are common. °You are near certain animals or the places they live. °You are older than 72 years of age. °What are the signs or symptoms? °Symptoms of this condition include: °A cough. °A fever. °Sweating or chills. °Chest pain, often when you breathe deeply or cough. °Breathing problems, such as: °Fast breathing. °Trouble breathing. °Shortness of breath. °Feeling tired (fatigued). °Muscle aches. °How is this treated? °Treatment for this condition depends on many things, such as: °The cause of your illness. °Your medicines. °Your other health problems. °Most adults can be treated at home. Sometimes, treatment must happen in a hospital. °Treatment may include  medicines to kill germs. °Medicines may depend on which germ caused your illness. °Very bad pneumonia is rare. If you get it, you may: °Have a machine to help you breathe. °Have fluid taken away from around your lungs. °Follow these instructions at home: °Medicines °Take over-the-counter and prescription medicines only as told by your doctor. °Take cough medicine only if you are losing sleep. Cough medicine can keep your body from taking mucus away from your lungs. °If you were prescribed an antibiotic medicine, take it as told by your doctor. Do not stop taking the antibiotic even if you start to feel better. °Lifestyle °  °Do not drink alcohol. °Do not use any products that contain nicotine or tobacco, such as cigarettes, e-cigarettes, and chewing tobacco. If you need help quitting, ask your doctor. °Eat a healthy diet. This includes a lot of vegetables, fruits, whole grains, low-fat dairy products, and low-fat (lean) protein. °General instructions ° °Rest a lot. Sleep for at least 8 hours each night. °Sleep with your head and neck raised. Put a few pillows under your head or sleep in a reclining chair. °Return to your normal activities as told by your doctor. Ask your doctor what activities are safe for you. °Drink enough fluid to keep your pee (urine) pale yellow. °If your throat is sore, rinse your mouth often with salt water. To make salt water, dissolve ½-1 tsp (3-6 g) of salt in 1 cup (237 mL) of warm water. °Keep all follow-up visits as told by your doctor. This is important. °How is this prevented? °You can lower your risk of pneumonia by: °Getting the pneumonia shot (vaccine). These shots have different   types and schedules. Ask your doctor what works best for you. Think about getting this shot if: °You are older than 72 years of age. °You are 19-65 years of age and: °You are being treated for cancer. °You have long-term lung disease. °You have other problems that affect your body's defense system. Ask  your doctor if you have one of these. °Getting your flu shot every year. Ask your doctor which type of shot is best for you. °Going to the dentist as often as told. °Washing your hands often with soap and water for at least 20 seconds. If you cannot use soap and water, use hand sanitizer. °Contact a doctor if: °You have a fever. °You lose sleep because your cough medicine does not help. °Get help right away if: °You are short of breath and this gets worse. °You have more chest pain. °Your sickness gets worse. This is very serious if: °You are an older adult. °Your body's defense system is weak. °You cough up blood. °These symptoms may be an emergency. Do not wait to see if the symptoms will go away. Get medical help right away. Call your local emergency services (911 in the [place] Do not drive yourself to the hospital. °Summary °Pneumonia is an infection of the lungs. °Community-acquired pneumonia affects people who have not been in the hospital. Certain germs can cause this infection. °This condition may be treated with medicines that kill germs. °For very bad pneumonia, you may need a hospital stay and treatment to help with breathing. °This information is not intended to replace advice given to you by your health care provider. Make sure you discuss any questions you have with your health care provider. °Document Revised: 05/03/2019 Document Reviewed: 05/03/2019 °[employer] Patient Education © 2022 [employer] ° °

## 2021-09-16 ENCOUNTER — Ambulatory Visit (INDEPENDENT_AMBULATORY_CARE_PROVIDER_SITE_OTHER): Payer: Medicare HMO | Admitting: Physician Assistant

## 2021-09-16 ENCOUNTER — Other Ambulatory Visit: Payer: Self-pay

## 2021-09-16 ENCOUNTER — Telehealth: Payer: Self-pay | Admitting: *Deleted

## 2021-09-16 VITALS — BP 120/80 | HR 87 | Ht 58.5 in | Wt 151.0 lb

## 2021-09-16 DIAGNOSIS — I5181 Takotsubo syndrome: Secondary | ICD-10-CM | POA: Diagnosis not present

## 2021-09-16 DIAGNOSIS — I428 Other cardiomyopathies: Secondary | ICD-10-CM

## 2021-09-16 DIAGNOSIS — I480 Paroxysmal atrial fibrillation: Secondary | ICD-10-CM

## 2021-09-16 DIAGNOSIS — J9621 Acute and chronic respiratory failure with hypoxia: Secondary | ICD-10-CM | POA: Diagnosis not present

## 2021-09-16 DIAGNOSIS — I5022 Chronic systolic (congestive) heart failure: Secondary | ICD-10-CM

## 2021-09-16 DIAGNOSIS — R7989 Other specified abnormal findings of blood chemistry: Secondary | ICD-10-CM | POA: Diagnosis not present

## 2021-09-16 LAB — CULTURE, BLOOD (ROUTINE X 2)
Culture: NO GROWTH
Culture: NO GROWTH
Culture: NO GROWTH
Culture: NO GROWTH
Special Requests: ADEQUATE
Special Requests: ADEQUATE
Special Requests: ADEQUATE

## 2021-09-16 MED ORDER — LOSARTAN POTASSIUM 25 MG PO TABS
12.5000 mg | ORAL_TABLET | Freq: Every day | ORAL | 3 refills | Status: DC
Start: 2021-09-16 — End: 2022-06-24

## 2021-09-16 NOTE — Progress Notes (Signed)
Cardiology Office Note    Date:  09/16/2021   ID:  [name], DOB 01-29-1951, MRN [id number]  PCP:  [name], NP  Cardiologist:  [name], MD  Electrophysiologist:  None   Chief Complaint: Hospital follow up  History of Present Illness:   Jordan Chan is a 71 y.o. female with history of nonobstructive CAD by [place] in 2018, stress-induced cardiomyopathy in 04/2017 in the setting of pain pump malfunction and opioid withdrawal, PE following hospitalization for PNA in the summer of 2021 treated with Eliquis, anemia/leukocytosis/thrombocytosis followed by hematology, chronic low back pain, DM, COPD, sleep apnea intolerant to CPAP on nocturnal oxygen, IBS, influenza A in 07/2021, and GERD who presents for hospital follow-up as outlined below.    Echo in 04/2017 showed an EF of 25-30%, severe hypokinesis of the mid and apical myocardium suggestive of stress-induced cardiomyopathy, mild left atrial enlargement, and a poorly visualized RV that was grossly normal in size.  LHC at that time showed no angiographically significant CAD with a severely reduced LVEF estimated at 25 to 30% with mid and apical akinesis consistent with stress-induced cardiomyopathy.  Repeat echo in 05/2017, through [facility], showed an improving LV systolic function with an EF of 45 to 50%, global hypokinesis, normal LV size and wall thickness, grade 1 diastolic dysfunction, and normal RV systolic function and ventricular cavity size.  Limited echo in 08/2017 showed a low normal LV systolic function with an EF of 50 to 55%.  Echo in 07/2020 showed an EF of 45 to 50%, global hypokinesis, grade 2 diastolic dysfunction, normal RV systolic function and ventricular cavity size, no pericardial effusion, no significant valvular abnormalities, and a normal CVP.     She was seen in 12/2020 and reported feeling short of breath and fatigue "all of the time since January."  D-dimer was elevated with subsequent CTA  chest in the ED being negative for PE.  Echo from 12/2020, performed in the context of feeling short of breath and fatigued showed an EF of 45% with global hypokinesis, grade 2 diastolic dysfunction, normal RV systolic function and ventricular cavity size, no pericardial effusion, and no significant valvular abnormalities.   She was seen in the office in 01/2021 and continued to feel about the same, possibly slightly better when compared to her visit 1 month prior.  She had been diagnosed with anemia and was started on folate as well as iron infusions.  She continued to report shortness of breath with mild activity and with talking for extended time frames.  She had a chronic cough.  She had no chest pain or palpitations.  She noted minimal lower extremity edema.  At times, she reported feeling off balance as if she was "walking sideways."  It was noted her lisinopril had been decreased to 2.5 mg in the setting of low blood pressure.  Her cardiomyopathy was felt to not completely explain her symptoms of fatigue and shortness of breath.  She appeared euvolemic.  There was low suspicion for ischemic heart disease driving her symptoms given LHC as outlined above.   She was seen in the [facility] ED on 01/11/2021 with reports of diarrhea and ongoing shortness of breath.  High-sensitivity troponin was negative.  EKG showed sinus rhythm without acute ST-T changes.  CT of the abdomen/pelvis showed diverticulosis without diverticulitis.   She was last seen in the office in 06/2021 noting stable longstanding fatigue, which she attributed to only sleeping a couple hours per night with  noted intolerance to CPAP.  Her dyspnea was stable.  Her weight was down 12 pounds when compared to her prior clinic visit.  She was seen in the ED in 07/2021 and diagnosed with influenza A.  She was admitted to the hospital on 2/2 through 2/5 with COPD exacerbation and pneumonia.  She tested positive for metapneumovirus.  She was treated with  inhalers, antibiotics, and steroids.  She was readmitted to [facility] from 2/8 through 2/11 with acute on chronic hypoxic respiratory failure and septic shock requiring vasopressor support with Levophed in the setting of PNA and acute COPD exacerbation, HFrEF, and new onset Afib with RVR.  CTA chest showed no evidence of PE with multifocal pneumonia noted.  High-sensitivity troponin normal x4 during the admission.  BNP 494.  Echo demonstrated an EF of 25 to 30%, global hypokinesis, grade 2 diastolic dysfunction, moderately reduced RV systolic function, no significant valvular abnormalities, and an estimated right atrial pressure of 8 mmHg.  With regards to her A-fib, she was placed on an amiodarone infusion and loaded with digoxin.  She subsequently converted to sinus rhythm without significant posttermination pause.  She was noted to have an elevated digoxin level at 2.4, though did not require Digibind and subsequently improved with holding of medication.  Relative hypotension, in the context of her septic shock, precluded escalation of GDMT.  She was discharged on 2/11.  She contacted our office this morning requesting urgent appointment, to ensure she was maintaining sinus rhythm.  She comes in doing well from a cardiac perspective.  No symptoms of angina or decompensation.  No palpitations, dizziness, presyncope, or syncope.  She does continue to note some generalized fatigue, though feels like she is improving following her hospital discharge.  She also continues to note a nonproductive cough, though feels like this is improving as well.  No lower extremity swelling, abdominal distention, orthopnea.  She is watching her salt and fluid intake.  She has not yet started amiodarone since leaving the hospital.  Otherwise, she is tolerating apixaban, metoprolol, and furosemide.     Labs independently reviewed: 09/2021 - potassium 4.4, BUN 10, serum creatinine 0.78, magnesium 2.1, Hgb 10.7, PLT 396, TSH 4.  8 9, T4  normal, albumin 3.2, AST/ALT normal, A1c 8.0 04/2021 - TC 141, TG 151, HDL 50, LDL 65  Past Medical History:  Diagnosis Date   Arthritis    Asthma    Breast cancer ([place]) 1998   right breast ca with mastectomy and chemotherapy and radiation   Bronchitis    CHF (congestive heart failure) ([place])    "with Morphine withdrawal"   COPD (chronic obstructive pulmonary disease) ([place])    Diabetes mellitus without complication ([place])    Diverticulitis    diverticulosis also   Dyspnea    Endometriosis    GERD (gastroesophageal reflux disease)    History of shingles 2000-2005   Hypercholesteremia    Hypertension    IBS (irritable bowel syndrome)    Low back pain    a. Implanted morphine/bupivicaine/clonidine pump.   Neuropathy    Orthopnea    Oxygen dependent    uses at night   Personal history of chemotherapy    Personal history of radiation therapy    Pneumonia    pneumonia 5-6 times, history of bronchitis also   Scoliosis    Sleep apnea    does not use cpap   Stroke ([place]) 2010   TIA, 10 years ago   Withdrawal from sedative drug ([place])  withdrawal from morphine when pump batteries died    Past Surgical History:  Procedure Laterality Date   ABDOMINAL HYSTERECTOMY  1987   BACK SURGERY     Tailbone removed following fracture   BREAST SURGERY Right    mastectomy   CARDIAC CATHETERIZATION     CATARACT EXTRACTION W/PHACO Right 05/19/2019   Procedure: CATARACT EXTRACTION PHACO AND INTRAOCULAR LENS PLACEMENT ([place]), RIGHT, DIABETIC;  Surgeon: Marchia Meiers, MD;  Location: [facility] ORS;  Service: Ophthalmology;  Laterality: Right;  Lot # [id number] H [place]: 00:43.8 CDE: 4.59   CATARACT EXTRACTION W/PHACO Left 06/16/2019   Procedure: CATARACT EXTRACTION PHACO AND INTRAOCULAR LENS PLACEMENT ([place]) LEFT VISION BLUE DIABETIC;  Surgeon: Marchia Meiers, MD;  Location: [facility] ORS;  Service: Ophthalmology;  Laterality: Left;  Lot #[id number] H [place]: 00:46.9 CDE: 6.53   COCCYX REMOVAL     COLONOSCOPY WITH  PROPOFOL N/A 01/01/2017   Procedure: COLONOSCOPY WITH PROPOFOL;  Surgeon: Jonathon Bellows, MD;  Location: [facility] ENDOSCOPY;  Service: Endoscopy;  Laterality: N/A;   ELBOW ARTHROSCOPY WITH TENDON RECONSTRUCTION     ESOPHAGOGASTRODUODENOSCOPY (EGD) WITH PROPOFOL N/A 01/01/2017   Procedure: ESOPHAGOGASTRODUODENOSCOPY (EGD) WITH PROPOFOL;  Surgeon: Jonathon Bellows, MD;  Location: [facility] ENDOSCOPY;  Service: Endoscopy;  Laterality: N/A;   INTRATHECAL PUMP IMPLANT     LEFT HEART CATH AND CORONARY ANGIOGRAPHY N/A 04/28/2017   Procedure: LEFT HEART CATH AND CORONARY ANGIOGRAPHY;  Surgeon: [name], MD;  Location: [facility] CV LAB;  Service: Cardiovascular;  Laterality: N/A;   MASTECTOMY Right 06/1997   morphine pump  2011   PLANTAR FASCIA RELEASE     TRIGGER FINGER RELEASE      Current Medications: Current Meds  Medication Sig   ACCU-CHEK AVIVA PLUS test strip TEST THREE TIMES DAILY   Accu-Chek Softclix Lancets lancets TEST BLOOD SUGAR THREE TIMES DAILY   albuterol (PROVENTIL HFA;VENTOLIN HFA) 108 (90 BASE) MCG/ACT inhaler Inhale 2 puffs into the lungs every 6 (six) hours as needed for wheezing or shortness of breath.    Alcohol Swabs (B-D SINGLE USE SWABS REGULAR) PADS USE TWICE DAILY  WITH  SUGAR  CHECKS   apixaban (ELIQUIS) 5 MG TABS tablet Take 1 tablet (5 mg total) by mouth 2 (two) times daily.   atorvastatin (LIPITOR) 20 MG tablet Take 1 tablet (20 mg total) by mouth daily.   Blood Glucose Monitoring Suppl (TRUE METRIX METER) w/Device KIT Use to check blood sugar 4 times a day   cefUROXime (CEFTIN) 500 MG tablet Take 1 tablet (500 mg total) by mouth 2 (two) times daily with a meal for 4 days.   cholecalciferol (VITAMIN D3) 25 MCG (1000 UNIT) tablet Take 1,000 Units by mouth daily.   Cyanocobalamin 1000 MCG/ML KIT Inject 1,000 mcg as directed every 30 (thirty) days.   cyclobenzaprine (FLEXERIL) 5 MG tablet Take 5 mg by mouth 2 (two) times daily as needed for muscle spasms. Takes very rarely    dextromethorphan (DELSYM) 30 MG/5ML liquid Take 2.5 mLs (15 mg total) by mouth 2 (two) times daily.   dextromethorphan (DELSYM) 30 MG/5ML liquid Take 2.5 mLs by mouth 2 (two) times daily as needed for cough.   diclofenac (VOLTAREN) 50 MG EC tablet TAKE 1 TABLET THREE TIMES DAILY   Dulaglutide (TRULICITY) 4.5 HE/5.2DP SOPN Inject 4.5 mg as directed once a week.   folic acid (FOLVITE) 1 MG tablet TAKE 1 TABLET EVERY DAY   furosemide (LASIX) 40 MG tablet TAKE 1 TABLET EVERY DAY   gabapentin (NEURONTIN) 800 MG tablet Take 1  tablet (800 mg total) by mouth 3 (three) times daily.   insulin degludec (TRESIBA FLEXTOUCH) 100 UNIT/ML FlexTouch Pen Inject 50 Units into the skin at bedtime.   lactulose (CHRONULAC) 10 GM/15ML solution Take 45 mLs (30 g total) by mouth daily as needed for mild constipation or severe constipation.   losartan (COZAAR) 25 MG tablet Take 0.5 tablets (12.5 mg total) by mouth daily.   metFORMIN (GLUCOPHAGE) 1000 MG tablet Take 1 tablet (1,000 mg total) by mouth 2 (two) times daily with a meal.   metoprolol succinate (TOPROL-XL) 25 MG 24 hr tablet TAKE 1/2 TABLET EVERY DAY   montelukast (SINGULAIR) 10 MG tablet Take 10 mg by mouth at bedtime.   PAIN MANAGEMENT INTRATHECAL, IT, PUMP 1 each by Intrathecal route. Intrathecal (IT) medication:  Morphine Patient does not remember current. Adjusted every 2.5 months at [facility] in [place].   pantoprazole (PROTONIX) 40 MG tablet Take 1 tablet (40 mg total) by mouth daily.   rOPINIRole (REQUIP) 0.25 MG tablet Take 1 tablet (0.25 mg total) by mouth at bedtime.   TRELEGY ELLIPTA 100-62.5-25 MCG/INH AEPB INHALE 1 PUFF INTO THE LUNGS DAILY.   venlafaxine XR (EFFEXOR-XR) 150 MG 24 hr capsule Take 1 capsule (150 mg total) by mouth daily.    Allergies:   Other, Pain patch [menthol], Avelox [moxifloxacin hcl in nacl], Doxycycline, Erythromycin, Fentanyl, Moxifloxacin hcl, Oxycontin [oxycodone], and Ozempic [semaglutide]    Social History   Socioeconomic History   Marital status: Unknown    Spouse name: Not on file   Number of children: Not on file   Years of education: Not on file   Highest education level: High school graduate  Occupational History   Occupation: retired  Tobacco Use   Smoking status: Former    Packs/day: 1.00    Years: 30.00    Pack years: 30.00    Types: Cigarettes    Quit date: 04/30/2004    Years since quitting: 17.3   Smokeless tobacco: Never  Vaping Use   Vaping Use: Never used  Substance and Sexual Activity   Alcohol use: No   Drug use: No   Sexual activity: Not Currently    Birth control/protection: Post-menopausal  Other Topics Concern   Not on file  Social History Narrative   ** Merged History Encounter **       Social Determinants of Health   Financial Resource Strain: Low Risk    Difficulty of Paying Living Expenses: Not hard at all  Food Insecurity: No Food Insecurity   Worried About Charity fundraiser in the Last Year: Never true   [place] in the Last Year: Never true  Transportation Needs: No Transportation Needs   Lack of Transportation (Medical): No   Lack of Transportation (Non-Medical): No  Physical Activity: Inactive   Days of Exercise per Week: 0 days   Minutes of Exercise per Session: 0 min  Stress: No Stress Concern Present   Feeling of Stress : Not at all  Social Connections: Not on file     Family History:  The patient's family history includes Arthritis in her paternal grandfather; Cancer in her paternal grandmother and sister; Cancer (age of onset: 55) in her father; Cancer (age of onset: 67) in her mother; Hyperlipidemia in her sister and son; Hypertension in her sister; Osteoporosis in her maternal grandmother; Seizures in her son; Thyroid disease in her mother. There is no history of Breast cancer.  ROS:   Review of  Systems  Constitutional:  Positive for malaise/fatigue. Negative for chills, diaphoresis, fever and weight  loss.  HENT:  Negative for congestion.   Eyes:  Negative for discharge and redness.  Respiratory:  Positive for cough and shortness of breath. Negative for sputum production and wheezing.   Cardiovascular:  Negative for chest pain, palpitations, orthopnea, claudication, leg swelling and PND.  Gastrointestinal:  Negative for abdominal pain, blood in stool, heartburn, melena, nausea and vomiting.  Musculoskeletal:  Negative for falls and myalgias.  Skin:  Negative for rash.  Neurological:  Positive for weakness. Negative for dizziness, tingling, tremors, sensory change, speech change, focal weakness and loss of consciousness.  Endo/Heme/Allergies:  Does not bruise/bleed easily.  Psychiatric/Behavioral:  Negative for substance abuse. The patient is not nervous/anxious.     EKGs/Labs/Other Studies Reviewed:    Studies reviewed were summarized above. The additional studies were reviewed today:  2D echo 09/11/2021: 1. Left ventricular ejection fraction, by estimation, is 25 to 30%. The  left ventricle has severely decreased function. The left ventricle  demonstrates global hypokinesis. Left ventricular diastolic parameters are  consistent with Grade II diastolic  dysfunction (pseudonormalization).   2. Right ventricular systolic function is moderately reduced. The right  ventricular size is not well visualized.   3. The mitral valve is normal in structure. No evidence of mitral valve  regurgitation.   4. The aortic valve is grossly normal. Aortic valve regurgitation is not  visualized.   5. The inferior vena cava is normal in size with <50% respiratory  variability, suggesting right atrial pressure of 8 mmHg. __________   2D echo 12/11/2020: 1. Left ventricular ejection fraction, by estimation, is 45%. The left  ventricle has mildly decreased function. The left ventricle demonstrates  global hypokinesis. Left ventricular diastolic parameters are consistent  with Grade II diastolic  dysfunction  (pseudonormalization).   2. Right ventricular systolic function is normal. The right ventricular  size is normal.   3. The mitral valve is normal in structure. No evidence of mitral valve  regurgitation.   4. The aortic valve is tricuspid. Aortic valve regurgitation is not  visualized.   Comparison(s): EF 45-50%. __________   LHC 04/28/2017: Conclusions: No angiographically significant coronary artery disease. Severely reduced LV contraction (LVEF 25-30%) with mid and apical akinesis, consistent with Takotsubo (stress-induced) cardiomyopathy. Mildly elevated left ventricular filling pressure.   Recommendations: Medical therapy, including initiation of metoprolol succinate 12.5 mg daily and furosemide 20 mg IV daily. If blood pressure tolerates, consider addition of low-dose ACE inhibitor tomorrow. Continue treatment of pain and opioid withdrawal.   EKG:  EKG is ordered today.  The EKG ordered today demonstrates NSR, 87 bpm, possible prior anterior infarct, poor R wave progression along the precordial leads, no acute ST-T change  Recent Labs: 09/11/2021: ALT 16; B Natriuretic Peptide 494.2; TSH 4.890 09/13/2021: Hemoglobin 10.7; Platelets 396 09/14/2021: BUN 10; Creatinine, Ser 0.78; Magnesium 2.1; Potassium 4.4; Sodium 137  Recent Lipid Panel    Component Value Date/Time   CHOL 141 04/29/2021 0944   CHOL 127 07/26/2019 0817   TRIG 151 (H) 04/29/2021 0944   TRIG 104 07/26/2019 0817   HDL 50 04/29/2021 0944   VLDL 21 07/26/2019 0817   LDLCALC 65 04/29/2021 0944    PHYSICAL EXAM:    VS:  BP 120/80 (BP Location: Left Arm, Patient Position: Sitting, Cuff Size: Normal)    Pulse 87    Ht 4' 10.5" (1.486 m)    Wt 151 lb (68.5 kg)  LMP  (LMP Unknown)    SpO2 95%    BMI 31.02 kg/m   BMI: Body mass index is 31.02 kg/m.  Physical Exam Constitutional:      Appearance: She is well-developed.  HENT:     Head: Normocephalic and atraumatic.  Eyes:     General:         Right eye: No discharge.        Left eye: No discharge.  Neck:     Vascular: No JVD.  Cardiovascular:     Rate and Rhythm: Normal rate and regular rhythm.     Pulses:          Dorsalis pedis pulses are 2+ on the right side and 2+ on the left side.       Posterior tibial pulses are 2+ on the right side and 2+ on the left side.     Heart sounds: Normal heart sounds, S1 normal and S2 normal. Heart sounds not distant. No midsystolic click and no opening snap. No murmur heard.   No friction rub.  Pulmonary:     Effort: Pulmonary effort is normal. No respiratory distress.     Breath sounds: Wheezing present. No decreased breath sounds or rales.     Comments: Coarse breath sounds bilaterally with faint expiratory wheezing. Chest:     Chest wall: No tenderness.  Abdominal:     General: There is no distension.     Palpations: Abdomen is soft.     Tenderness: There is no abdominal tenderness.  Musculoskeletal:     Cervical back: Normal range of motion.     Right lower leg: No edema.     Left lower leg: No edema.  Skin:    General: Skin is warm and dry.     Nails: There is no clubbing.  Neurological:     Mental Status: She is alert and oriented to person, place, and time.  Psychiatric:        Speech: Speech normal.        Behavior: Behavior normal.        Thought Content: Thought content normal.        Judgment: Judgment normal.    Wt Readings from Last 3 Encounters:  09/16/21 151 lb (68.5 kg)  09/14/21 152 lb 6.4 oz (69.1 kg)  09/06/21 148 lb 14.4 oz (67.5 kg)     ASSESSMENT & PLAN:   Recently diagnosed A-fib: Maintaining sinus rhythm.  Continue Toprol XL 12.5 mg daily along with amiodarone 400 mg twice daily until 2/16 at which time she will decrease to 200 mg twice daily until 2/23 at which time she will decrease to 200 mg daily thereafter.  CHA2DS2-VASc at least 5 (CHF, age x1, DM, vascular disease, sex category).  Continue apixaban 5 mg twice daily.  No symptoms concerning for  bleeding.  Recent labs stable.  Hopefully, we will be able to discontinue amiodarone in several months time, if there has been no recurrence of A-fib.  HFrEF secondary to presumed stress-induced versus tachy-mediated cardiomyopathy: She appears euvolemic and well compensated.  LHC in 2018 was without evidence of CAD.  During her admission, relative hypotension, in the context of septic shock, precluded escalation of GDMT.  Blood pressure is improved today.  Add losartan 12.5 mg daily with a follow-up BMP in 1 week.  She will otherwise continue Toprol-XL 12.5 mg and furosemide 40 mg.  Continue to escalate GDMT with possible transition of ARB to ARNI along with addition of MRA  and SGLT2i in follow-up as vital signs and labs allow.  Look to repeat a limited echo in several months time, following optimization of GDMT, to reevaluate LV systolic function.  If her cardiomyopathy persists at that time, would recommend coronary CTA.  Labs are deferred today given these were stable upon discharge less than 48 hours ago.  Acute on chronic hypoxic respiratory failure with recent septic shock secondary to pneumonia and COPD exacerbation: Improving.  Follow-up with PCP.  Abnormal TSH: Felt to be in the setting of her acute illness.  Normal free T4.  Follow-up with PCP.  Amiodarone should be able to be continued in this setting.    Disposition: F/u with Dr. Saunders Revel or an APP in 1 month.   Medication Adjustments/Labs and Tests Ordered: Current medicines are reviewed at length with the patient today.  Concerns regarding medicines are outlined above. Medication changes, Labs and Tests ordered today are summarized above and listed in the Patient Instructions accessible in Encounters.   Signed, [name], PA-C 09/16/2021 12:15 PM     [place] [place] [address] [place], [place] [address] [phone]

## 2021-09-16 NOTE — Telephone Encounter (Signed)
Transition Care Management Unsuccessful Follow-up Telephone Call  Date of discharge and from where:  [facility]  Attempts:  1st Attempt  Reason for unsuccessful TCM follow-up call:  Left voice message     Patient is scheduled for a hospital follow up 09-17-2021 @ 8:20

## 2021-09-16 NOTE — Patient Instructions (Signed)
Medication Instructions:  Your physician has recommended you make the following change in your medication:   START Losartan 12.5 mg once daily   *If you need a refill on your cardiac medications before your next appointment, please call your pharmacy*   Lab Work: BMET in one week  If you have labs (blood work) drawn today and your tests are completely normal, you will receive your results only by: [place] (if you have MyChart) OR A paper copy in the mail If you have any lab test that is abnormal or we need to change your treatment, we will call you to review the results.   Testing/Procedures: None    Follow-Up: At [facility], you and your health needs are our priority.  As part of our continuing mission to provide you with exceptional heart care, we have created designated Provider Care Teams.  These Care Teams include your primary Cardiologist (physician) and Advanced Practice Providers (APPs -  Physician Assistants and Nurse Practitioners) who all work together to provide you with the care you need, when you need it.  We recommend signing up for the patient portal called "MyChart".  Sign up information is provided on this After Visit Summary.  MyChart is used to connect with patients for Virtual Visits (Telemedicine).  Patients are able to view lab/test results, encounter notes, upcoming appointments, etc.  Non-urgent messages can be sent to your provider as well.   To learn more about what you can do with MyChart, go to [email or web address].    Your next appointment:   1 month(s)  The format for your next appointment:   In Person  Provider:   [name], MD or [name], PA-C

## 2021-09-17 ENCOUNTER — Ambulatory Visit: Payer: Medicare HMO | Admitting: Nurse Practitioner

## 2021-09-17 ENCOUNTER — Encounter: Payer: Self-pay | Admitting: Nurse Practitioner

## 2021-09-17 ENCOUNTER — Ambulatory Visit (INDEPENDENT_AMBULATORY_CARE_PROVIDER_SITE_OTHER): Payer: Medicare HMO | Admitting: Nurse Practitioner

## 2021-09-17 VITALS — BP 113/68 | HR 76 | Temp 98.1°F | Ht 58.5 in | Wt 148.6 lb

## 2021-09-17 DIAGNOSIS — I700 Atherosclerosis of aorta: Secondary | ICD-10-CM | POA: Diagnosis not present

## 2021-09-17 DIAGNOSIS — I5022 Chronic systolic (congestive) heart failure: Secondary | ICD-10-CM

## 2021-09-17 DIAGNOSIS — F324 Major depressive disorder, single episode, in partial remission: Secondary | ICD-10-CM | POA: Diagnosis not present

## 2021-09-17 DIAGNOSIS — I4891 Unspecified atrial fibrillation: Secondary | ICD-10-CM | POA: Diagnosis not present

## 2021-09-17 DIAGNOSIS — N1831 Chronic kidney disease, stage 3a: Secondary | ICD-10-CM

## 2021-09-17 DIAGNOSIS — E119 Type 2 diabetes mellitus without complications: Secondary | ICD-10-CM

## 2021-09-17 DIAGNOSIS — J449 Chronic obstructive pulmonary disease, unspecified: Secondary | ICD-10-CM

## 2021-09-17 DIAGNOSIS — J189 Pneumonia, unspecified organism: Secondary | ICD-10-CM

## 2021-09-17 DIAGNOSIS — E66811 Obesity, class 1: Secondary | ICD-10-CM

## 2021-09-17 DIAGNOSIS — A419 Sepsis, unspecified organism: Secondary | ICD-10-CM

## 2021-09-17 DIAGNOSIS — E6609 Other obesity due to excess calories: Secondary | ICD-10-CM

## 2021-09-17 DIAGNOSIS — E1159 Type 2 diabetes mellitus with other circulatory complications: Secondary | ICD-10-CM

## 2021-09-17 DIAGNOSIS — E538 Deficiency of other specified B group vitamins: Secondary | ICD-10-CM | POA: Diagnosis not present

## 2021-09-17 DIAGNOSIS — R7989 Other specified abnormal findings of blood chemistry: Secondary | ICD-10-CM

## 2021-09-17 DIAGNOSIS — I152 Hypertension secondary to endocrine disorders: Secondary | ICD-10-CM

## 2021-09-17 DIAGNOSIS — Z6830 Body mass index (BMI) 30.0-30.9, adult: Secondary | ICD-10-CM

## 2021-09-17 DIAGNOSIS — R5383 Other fatigue: Secondary | ICD-10-CM | POA: Diagnosis not present

## 2021-09-17 MED ORDER — CYANOCOBALAMIN 1000 MCG/ML IJ SOLN
1000.0000 ug | Freq: Once | INTRAMUSCULAR | Status: AC
  Administered 2021-09-17: 1000 ug via INTRAMUSCULAR

## 2021-09-17 MED ORDER — ONDANSETRON HCL 4 MG PO TABS: 4.0000 mg | ORAL_TABLET | Freq: Three times a day (TID) | ORAL | 4 refills | Status: DC | PRN

## 2021-09-17 NOTE — Assessment & Plan Note (Signed)
Chronic, ongoing with exacerbation due to health issues.  Denies SI/HI.  Mood well-controlled at this time.  Continue current medication regimen and adjust as needed.  Refills as needed.  Consider trial of Duloxetine and transition off Effexor in future for dual pain and mood benefit.

## 2021-09-17 NOTE — Assessment & Plan Note (Signed)
New diagnosis on 09/11/21 with underlying PNA.  At this time continue current medication regimen as ordered by cardiology and collaboration with cardiology.  Good rate control today.

## 2021-09-17 NOTE — Assessment & Plan Note (Signed)
Refer to pneumonia with sepsis plan of care.

## 2021-09-17 NOTE — Assessment & Plan Note (Addendum)
Chronic, ongoing with recent EF 25-30%, followed by cardiology.  Euvolemic on exam today.  Continue current medication regimen and collaboration with cardiology.  Recommend: - Reminded to call for an overnight weight gain of >2 pounds or a weekly weight gain of >5 pounds - not adding salt to food and read food labels. Reviewed the importance of keeping daily sodium intake to 2000mg  daily - Avoid Ibuprofen products

## 2021-09-17 NOTE — Assessment & Plan Note (Signed)
Chronic, stable.  Check B12 today and continue injections monthly.

## 2021-09-17 NOTE — Assessment & Plan Note (Signed)
BMI 30.53.  Recommended eating smaller high protein, low fat meals more frequently and exercising 30 mins a day 5 times a week with a goal of 10-15lb weight loss in the next 3 months. Patient voiced their understanding and motivation to adhere to these recommendations.

## 2021-09-17 NOTE — Assessment & Plan Note (Signed)
Chronic, ongoing.  Continue Trelegy which offers benefit of medication minimization and has benefited symptoms.  Continue to collaborate with [name], recent notes reviewed.  No current daytime O2, at night only.  Recommend she follow-up with pulmonary ASAP.

## 2021-09-17 NOTE — Assessment & Plan Note (Addendum)
Acute and improving at this time, patient reports improving cough.  Continue current treatment regimen at home.  She declines PT.  Recommend she use incentive spirometer every 4 hours at home.  Return in one week.  Referral to GI due to concern for aspiration.  Labs today.

## 2021-09-17 NOTE — Assessment & Plan Note (Signed)
Chronic, stable with BP at goal today.  Recommend she continue to monitor BP at home regularly.  Focus on DASH diet.  Continue current medication regimen and adjust as needed, refills sent as needed.  CMP, CBC today.  Return in 3 months.

## 2021-09-17 NOTE — Assessment & Plan Note (Signed)
Noted on CT 12/11/19.  Continue daily statin.  Monitor closely for symptoms.

## 2021-09-17 NOTE — Patient Instructions (Signed)
Community-Acquired Pneumonia, Adult °Pneumonia is an infection of the lungs. It causes irritation and swelling in the airways of the lungs. Mucus and fluid may also build up inside the airways. This may cause coughing and trouble breathing. °One type of pneumonia can happen while you are in a hospital. A different type can happen when you are not in a hospital (community-acquired pneumonia). °What are the causes? °This condition is caused by germs (viruses, bacteria, or fungi). Some types of germs can spread from person to person. Pneumonia is not thought to spread from person to person. °What increases the risk? °You are more likely to develop this condition if: °You have a long-term (chronic) disease, such as: °Disease of the lungs. This may be chronic obstructive pulmonary disease (COPD) or asthma. °Heart failure. °Cystic fibrosis. °Diabetes. °Kidney disease. °Sickle cell disease. °HIV. °You have other health problems, such as: °Your body's defense system (immune system) is weak. °A condition that may cause you to breathe in fluids from your mouth and nose. °You had your spleen taken out. °You do not take good care of your teeth and mouth (poor dental hygiene). °You use or have used tobacco products. °You travel where the germs that cause this illness are common. °You are near certain animals or the places they live. °You are older than 72 years of age. °What are the signs or symptoms? °Symptoms of this condition include: °A cough. °A fever. °Sweating or chills. °Chest pain, often when you breathe deeply or cough. °Breathing problems, such as: °Fast breathing. °Trouble breathing. °Shortness of breath. °Feeling tired (fatigued). °Muscle aches. °How is this treated? °Treatment for this condition depends on many things, such as: °The cause of your illness. °Your medicines. °Your other health problems. °Most adults can be treated at home. Sometimes, treatment must happen in a hospital. °Treatment may include  medicines to kill germs. °Medicines may depend on which germ caused your illness. °Very bad pneumonia is rare. If you get it, you may: °Have a machine to help you breathe. °Have fluid taken away from around your lungs. °Follow these instructions at home: °Medicines °Take over-the-counter and prescription medicines only as told by your doctor. °Take cough medicine only if you are losing sleep. Cough medicine can keep your body from taking mucus away from your lungs. °If you were prescribed an antibiotic medicine, take it as told by your doctor. Do not stop taking the antibiotic even if you start to feel better. °Lifestyle °  °Do not drink alcohol. °Do not use any products that contain nicotine or tobacco, such as cigarettes, e-cigarettes, and chewing tobacco. If you need help quitting, ask your doctor. °Eat a healthy diet. This includes a lot of vegetables, fruits, whole grains, low-fat dairy products, and low-fat (lean) protein. °General instructions ° °Rest a lot. Sleep for at least 8 hours each night. °Sleep with your head and neck raised. Put a few pillows under your head or sleep in a reclining chair. °Return to your normal activities as told by your doctor. Ask your doctor what activities are safe for you. °Drink enough fluid to keep your pee (urine) pale yellow. °If your throat is sore, rinse your mouth often with salt water. To make salt water, dissolve ½-1 tsp (3-6 g) of salt in 1 cup (237 mL) of warm water. °Keep all follow-up visits as told by your doctor. This is important. °How is this prevented? °You can lower your risk of pneumonia by: °Getting the pneumonia shot (vaccine). These shots have different   types and schedules. Ask your doctor what works best for you. Think about getting this shot if: °You are older than 72 years of age. °You are 19-65 years of age and: °You are being treated for cancer. °You have long-term lung disease. °You have other problems that affect your body's defense system. Ask  your doctor if you have one of these. °Getting your flu shot every year. Ask your doctor which type of shot is best for you. °Going to the dentist as often as told. °Washing your hands often with soap and water for at least 20 seconds. If you cannot use soap and water, use hand sanitizer. °Contact a doctor if: °You have a fever. °You lose sleep because your cough medicine does not help. °Get help right away if: °You are short of breath and this gets worse. °You have more chest pain. °Your sickness gets worse. This is very serious if: °You are an older adult. °Your body's defense system is weak. °You cough up blood. °These symptoms may be an emergency. Do not wait to see if the symptoms will go away. Get medical help right away. Call your local emergency services (911 in the [place] Do not drive yourself to the hospital. °Summary °Pneumonia is an infection of the lungs. °Community-acquired pneumonia affects people who have not been in the hospital. Certain germs can cause this infection. °This condition may be treated with medicines that kill germs. °For very bad pneumonia, you may need a hospital stay and treatment to help with breathing. °This information is not intended to replace advice given to you by your health care provider. Make sure you discuss any questions you have with your health care provider. °Document Revised: 05/03/2019 Document Reviewed: 05/03/2019 °[employer] Patient Education © 2022 [employer] ° °

## 2021-09-17 NOTE — Progress Notes (Signed)
BP 113/68    Pulse 76    Temp 98.1 F (36.7 C) (Oral)    Ht 4' 10.5" (1.486 m)    Wt 148 lb 9.6 oz (67.4 kg)    LMP  (LMP Unknown)    SpO2 96%    BMI 30.53 kg/m    Subjective:    [name], female    DOB: 03/22/1951, 71 y.o.   MRN: [id number]  HPI: Jordan Chan is a 71 y.o. female  Chief Complaint  Patient presents with   Pneumonia    Patient states she does not feel good at all. Patient states the first time she went to the hospital she had pneumonia in one lung and then the second time she went she had pneumonia in the other lung and was told she has A-fib. Patient states she cannot stand to eat anything as she is always nauseated. Patient states she just does not feel like herself.    Transition of Care Hospital Follow up.  Recent admission to hospital due to new onset of atrial fibrillation secondary to pneumonia.  Had recent treatment for pneumonia in January 2023, which was resolved on imaging 08/21/21.  Then admitted for COPD exacerbation 09/05/21 and discharged on 09/08/21, with subsequent readmission on 09/11/21.    She reports improvement in cough at this time and no longer feels like something heavy sitting on chest -- non productive cough.  Denies any palpitations or CP.  Echo in hospital noted EF 25-30%, global hypokinesis, grade 2 diastolic dysfunction, moderately reduced RV systolic function, no significant valvular abnormalities, and an estimated right atrial pressure of 8 mmHg.  Denies edema to extremities, abdominal distension.  At home she sleeps sitting up in recliner, can not breath laying down at baseline -- ongoing for many years.  Is closely watching fluid and salt intake at home.  Currently taking Eliquis, Metoprolol, Amiodarone.  Saw cardiology yesterday for follow-up and started on Losartan 12.5 MG.  There is discussion of started SGLT2, but in past she has had yeast infections with these.    Hospital has recommended a referral to GI, years ago  patient had to have esophagus stretched and currently is having issues with swallowing solid foods which is worsening -- ongoing for 2 months.  No coughing with eating present.  No hemoptysis.     "Recommendations at discharge:    patient to use her inhalers and nebulizer as before follow-up [facility] cardiology on the appointment that will be called to you. Follow-up with your primary care physician in 1 to 2 weeks patient will need PCP referral for G.I. evaluation for dysphagia/rule out esophageal dismotility.   Discharge Diagnoses: Acute on chronic hypoxic respiratory failure secondary to COPD exacerbation improved Residual multifocal pneumonia A fib with RVR-- you     Hospital Course:   Jordan Chan is a 71 year old female with a past medical history significant for COPD requiring 2 L supplemental O2 at nighttime, HFrEF (last EF 45%) hypertension, hyperlipidemia, VTE, diabetes mellitus, breast cancer who presented to [facility] ED on 09/11/2021 due to complaints of progressive shortness of breath and generalized malaise/fatigue, pleuritic chest pain, and nausea/vomiting   acute on chronic hypoxic respiratory failure secondary to COPD exacerbation--hypoxia improved residual multifocal pneumonia recent meta-pneumo virus infection -- patient currently on room air. Sats 92--94%. -- Switch to oral antibiotic -- Pro calcitonin times two negative -- no fever -- patient is dry cough -- white count 13.8 -- respiratory viral panel this  admission negative -- blood culture negative -- CT chest no PE. Residual multifocal pneumonia -- sepsis resolved -- PRN bronchodilators --sats >92% ON RA   a fib with RVR -- patient currently in sinus rhythm -- followed by [facility] cardiology. Discussed with Dr. Rockey Situ patient was on IV amiodarone drip will switch to 400 mg BID for now. -- Continue IV heparin drip-- defer chronic anticoagulation to cardiology --2/10-- change to PO amiodarone per cardiology-Now on  oral amiodarone 400 mg bid for 1 week, followed by 200 mg bid for 1 week, followed by 200 mg daily thereafter to complete a 10 gram load -- started on PO eliquis 5 mg bid -- DC heparin drip   Chronic congestive heart failure systolic with stress induced cardiomyopathy -- patient euvolemic -- appreciate cardiology input. She will need outpatient ischemic evaluation   generalized weakness and deconditioning -- PT OT to see patient--pt is ambulating by herself. She does not want PT   dysphagia -- seen by speech therapy. Dietary recommendations made. Patient has history of esophageal dilatation many years ago. Patient recommended to follow-up with PCP for referral to G.I. as outpatient.   Overall hemodynamically stable. Will discharge patient to home. Discharge instructions given. Patient in agreement."  Hospital/Facility: [facility] D/C Physician: Dr. Posey Pronto D/C Date: 09/14/21  Records Requested: 09/17/21 Records Received: 09/17/21 Records Reviewed: 09/17/21  Diagnoses on Discharge:  Acute on chronic hypoxic respiratory failure secondary to COPD exacerbation improved Residual multifocal pneumonia A fib with RVR-- you  Date of interactive Contact within 48 hours of discharge:  Contact was through: phone  Date of 7 day or 14 day face-to-face visit:    within 7 days  Outpatient Encounter Medications as of 09/17/2021  Medication Sig   ACCU-CHEK AVIVA PLUS test strip TEST THREE TIMES DAILY   Accu-Chek Softclix Lancets lancets TEST BLOOD SUGAR THREE TIMES DAILY   albuterol (PROVENTIL HFA;VENTOLIN HFA) 108 (90 BASE) MCG/ACT inhaler Inhale 2 puffs into the lungs every 6 (six) hours as needed for wheezing or shortness of breath.    Alcohol Swabs (B-D SINGLE USE SWABS REGULAR) PADS USE TWICE DAILY  WITH  SUGAR  CHECKS   amiodarone (PACERONE) 200 MG tablet Take 2 tablets (400 mg total) by mouth 2 (two) times daily. Then from 09/19/21 take 200 mg twice a day and  From 09/26/21 take 200 mg daily    apixaban (ELIQUIS) 5 MG TABS tablet Take 1 tablet (5 mg total) by mouth 2 (two) times daily.   atorvastatin (LIPITOR) 20 MG tablet Take 1 tablet (20 mg total) by mouth daily.   Blood Glucose Monitoring Suppl (TRUE METRIX METER) w/Device KIT Use to check blood sugar 4 times a day   cefUROXime (CEFTIN) 500 MG tablet Take 1 tablet (500 mg total) by mouth 2 (two) times daily with a meal for 4 days.   cholecalciferol (VITAMIN D3) 25 MCG (1000 UNIT) tablet Take 1,000 Units by mouth daily.   Cyanocobalamin 1000 MCG/ML KIT Inject 1,000 mcg as directed every 30 (thirty) days.   cyclobenzaprine (FLEXERIL) 5 MG tablet Take 5 mg by mouth 2 (two) times daily as needed for muscle spasms. Takes very rarely   dextromethorphan (DELSYM) 30 MG/5ML liquid Take 2.5 mLs (15 mg total) by mouth 2 (two) times daily.   dextromethorphan (DELSYM) 30 MG/5ML liquid Take 2.5 mLs by mouth 2 (two) times daily as needed for cough.   diclofenac (VOLTAREN) 50 MG EC tablet TAKE 1 TABLET THREE TIMES DAILY   Dulaglutide (TRULICITY) 4.5  MG/0.5ML SOPN Inject 4.5 mg as directed once a week.   folic acid (FOLVITE) 1 MG tablet TAKE 1 TABLET EVERY DAY   furosemide (LASIX) 40 MG tablet TAKE 1 TABLET EVERY DAY   gabapentin (NEURONTIN) 800 MG tablet Take 1 tablet (800 mg total) by mouth 3 (three) times daily.   insulin degludec (TRESIBA FLEXTOUCH) 100 UNIT/ML FlexTouch Pen Inject 50 Units into the skin at bedtime.   lactulose (CHRONULAC) 10 GM/15ML solution Take 45 mLs (30 g total) by mouth daily as needed for mild constipation or severe constipation.   losartan (COZAAR) 25 MG tablet Take 0.5 tablets (12.5 mg total) by mouth daily.   metFORMIN (GLUCOPHAGE) 1000 MG tablet Take 1 tablet (1,000 mg total) by mouth 2 (two) times daily with a meal.   metoprolol succinate (TOPROL-XL) 25 MG 24 hr tablet TAKE 1/2 TABLET EVERY DAY   montelukast (SINGULAIR) 10 MG tablet Take 10 mg by mouth at bedtime.   ondansetron (ZOFRAN) 4 MG tablet Take 1 tablet (4  mg total) by mouth every 8 (eight) hours as needed for nausea or vomiting.   PAIN MANAGEMENT INTRATHECAL, IT, PUMP 1 each by Intrathecal route. Intrathecal (IT) medication:  Morphine Patient does not remember current. Adjusted every 2.5 months at [facility] in [place].   pantoprazole (PROTONIX) 40 MG tablet Take 1 tablet (40 mg total) by mouth daily.   rOPINIRole (REQUIP) 0.25 MG tablet Take 1 tablet (0.25 mg total) by mouth at bedtime.   TRELEGY ELLIPTA 100-62.5-25 MCG/INH AEPB INHALE 1 PUFF INTO THE LUNGS DAILY.   venlafaxine XR (EFFEXOR-XR) 150 MG 24 hr capsule Take 1 capsule (150 mg total) by mouth daily.   [EXPIRED] cyanocobalamin ((VITAMIN B-12)) injection 1,000 mcg    No facility-administered encounter medications on file as of 09/17/2021.    Diagnostic Tests Reviewed/Disposition: Reviewed recent labs on chart  Consults: [facility] cardiology  Discharge Instructions: Follow-up with cardiology and PCP  Disease/illness Education: Discussed with patient at length  Home Health/Community Services Discussions/Referrals: None at this time  Establishment or re-establishment of referral orders for community resources: None  Discussion with other health care providers: Reviewed recent notes  Assessment and Support of treatment regimen adherence:  Appointments Coordinated with:   Education for self-management, independent living, and ADLs:    COPD Followed by pulmonary for COPD -- last saw 12/722.  Currently using Trelegy and Singulair.   Currently having nausea post hospitalization, was started on Amiodarone.     Has a history of smoking, quit 15 years ago. She does not use CPAP, but continues to use O2 at night.  Currently off Eliquis, stopped in November 2021 -- history of PE.   COPD status: controlled Satisfied with current treatment?: yes Oxygen use: no Dyspnea frequency: at baseline, no worsening Cough frequency: improved Rescue inhaler frequency:   minimal Limitation of activity: no Productive cough: none Last Spirometry: with pulmonary Pneumovax: Up to Date Influenza: Up to Date  Depression screen [facility] 2/9 09/17/2021 08/14/2021 07/31/2021 04/29/2021 12/14/2020  Decreased Interest 3 0 0 0 0  Down, Depressed, Hopeless 2 2 1 1 2   PHQ - 2 Score 5 2 1 1 2   Altered sleeping 3 3 3 3 3   Tired, decreased energy 3 2 3 2 3   Change in appetite 3 2 2 2 3   Feeling bad or failure about yourself  0 0 0 0 0  Trouble concentrating 2 0 0 0 0  Moving slowly or fidgety/restless 0 0 0 0 0  Suicidal thoughts 0 0 0 0 0  PHQ-9 Score 16 9 9 8 11   Difficult doing work/chores Somewhat difficult - - Somewhat difficult Somewhat difficult  Some recent data might be hidden    Relevant past medical, surgical, family and social history reviewed and updated as indicated. Interim medical history since our last visit reviewed. Allergies and medications reviewed and updated.  Review of Systems  Constitutional:  Negative for activity change, appetite change, diaphoresis, fatigue and fever.  Respiratory:  Positive for cough (improving) and shortness of breath (at baseline). Negative for chest tightness and wheezing.   Cardiovascular:  Negative for chest pain, palpitations and leg swelling.  Gastrointestinal: Negative.   Endocrine: Negative for heat intolerance, polydipsia, polyphagia and polyuria.  Neurological: Negative.   Psychiatric/Behavioral: Negative.  Negative for decreased concentration, self-injury, sleep disturbance and suicidal ideas. The patient is not nervous/anxious.    Per HPI unless specifically indicated above     Objective:    BP 113/68    Pulse 76    Temp 98.1 F (36.7 C) (Oral)    Ht 4' 10.5" (1.486 m)    Wt 148 lb 9.6 oz (67.4 kg)    LMP  (LMP Unknown)    SpO2 96%    BMI 30.53 kg/m   Wt Readings from Last 3 Encounters:  09/17/21 148 lb 9.6 oz (67.4 kg)  09/16/21 151 lb (68.5 kg)  09/14/21 152 lb 6.4 oz (69.1 kg)    Physical Exam Vitals  and nursing note reviewed.  Constitutional:      General: She is awake. She is not in acute distress.    Appearance: She is well-developed and well-groomed. She is not ill-appearing.  HENT:     Head: Normocephalic.     Right Ear: Hearing normal.     Left Ear: Hearing normal.  Eyes:     General: Lids are normal.        Right eye: No discharge.        Left eye: No discharge.     Conjunctiva/sclera: Conjunctivae normal.     Pupils: Pupils are equal, round, and reactive to light.  Neck:     Thyroid: No thyromegaly.     Vascular: No carotid bruit.  Cardiovascular:     Rate and Rhythm: Normal rate and regular rhythm.     Heart sounds: Normal heart sounds. No murmur heard.   No gallop.  Pulmonary:     Effort: Pulmonary effort is normal. No accessory muscle usage or respiratory distress.     Breath sounds: Wheezing present. No rhonchi.     Comments: Intermittent expiratory wheezes noted throughout, no rhonchi. Abdominal:     General: Bowel sounds are normal.     Palpations: Abdomen is soft.  Musculoskeletal:     Cervical back: Normal range of motion and neck supple.     Right lower leg: No edema.     Left lower leg: No edema.  Lymphadenopathy:     Cervical: No cervical adenopathy.  Skin:    General: Skin is warm and dry.  Neurological:     Mental Status: She is alert and oriented to person, place, and time.  Psychiatric:        Attention and Perception: Attention normal.        Mood and Affect: Affect is tearful.        Speech: Speech normal.        Behavior: Behavior normal. Behavior is cooperative.        Thought  Content: Thought content normal.    Results for orders placed or performed during the hospital encounter of 09/11/21  Blood culture (routine x 2)   Specimen: BLOOD  Result Value Ref Range   Specimen Description BLOOD LEFT WRIST    Special Requests      BOTTLES DRAWN AEROBIC AND ANAEROBIC Blood Culture results may not be optimal due to an inadequate volume of  blood received in culture bottles   Culture      NO GROWTH 5 DAYS Performed at [facility], [address] [place], [place] [address]    Report Status 09/16/2021 FINAL   Blood culture (routine x 2)   Specimen: BLOOD  Result Value Ref Range   Specimen Description BLOOD RIGHT ANTECUBITAL    Special Requests      BOTTLES DRAWN AEROBIC AND ANAEROBIC Blood Culture adequate volume   Culture      NO GROWTH 5 DAYS Performed at [facility], [address] [place], [place] [address]    Report Status 09/16/2021 FINAL   Resp Panel by RT-PCR (Flu A&B, Covid) Nasopharyngeal Swab   Specimen: Nasopharyngeal Swab; Nasopharyngeal(NP) swabs in vial transport medium  Result Value Ref Range   SARS Coronavirus 2 by RT PCR NEGATIVE NEGATIVE   Influenza A by PCR NEGATIVE NEGATIVE   Influenza B by PCR NEGATIVE NEGATIVE  Respiratory (~20 pathogens) panel by PCR   Specimen: Nasopharyngeal Swab; Respiratory  Result Value Ref Range   Adenovirus NOT DETECTED NOT DETECTED   Coronavirus 229E NOT DETECTED NOT DETECTED   Coronavirus HKU1 NOT DETECTED NOT DETECTED   Coronavirus NL63 NOT DETECTED NOT DETECTED   Coronavirus OC43 NOT DETECTED NOT DETECTED   Metapneumovirus NOT DETECTED NOT DETECTED   Rhinovirus / Enterovirus NOT DETECTED NOT DETECTED   Influenza A NOT DETECTED NOT DETECTED   Influenza B NOT DETECTED NOT DETECTED   Parainfluenza Virus 1 NOT DETECTED NOT DETECTED   Parainfluenza Virus 2 NOT DETECTED NOT DETECTED   Parainfluenza Virus 3 NOT DETECTED NOT DETECTED   Parainfluenza Virus 4 NOT DETECTED NOT DETECTED   Respiratory Syncytial Virus NOT DETECTED NOT DETECTED   Bordetella pertussis NOT DETECTED NOT DETECTED   Bordetella Parapertussis NOT DETECTED NOT DETECTED   Chlamydophila pneumoniae NOT DETECTED NOT DETECTED   Mycoplasma pneumoniae NOT DETECTED NOT DETECTED  MRSA Next Gen by PCR, Nasal   Specimen: Nasal Mucosa; Nasal Swab  Result Value Ref Range   MRSA by PCR  Next Gen NOT DETECTED NOT DETECTED  CBC  Result Value Ref Range   WBC 16.4 (H) 4.0 - 10.5 K/uL   RBC 4.30 3.87 - 5.11 MIL/uL   Hemoglobin 12.7 12.0 - 15.0 g/dL   HCT 40.5 36.0 - 46.0 %   MCV 94.2 80.0 - 100.0 fL   MCH 29.5 26.0 - 34.0 pg   MCHC 31.4 30.0 - 36.0 g/dL   RDW 13.9 11.5 - 15.5 %   Platelets 481 (H) 150 - 400 K/uL   nRBC 0.0 0.0 - 0.2 %  Comprehensive metabolic panel  Result Value Ref Range   Sodium 136 135 - 145 mmol/L   Potassium 3.4 (L) 3.5 - 5.1 mmol/L   Chloride 98 98 - 111 mmol/L   CO2 29 22 - 32 mmol/L   Glucose, Bld 122 (H) 70 - 99 mg/dL   BUN 21 8 - 23 mg/dL   Creatinine, Ser 0.91 0.44 - 1.00 mg/dL   Calcium 8.3 (L) 8.9 - 10.3 mg/dL   Total Protein  7.0 6.5 - 8.1 g/dL   Albumin 3.2 (L) 3.5 - 5.0 g/dL   AST 19 15 - 41 U/L   ALT 16 0 - 44 U/L   Alkaline Phosphatase 48 38 - 126 U/L   Total Bilirubin 0.6 0.3 - 1.2 mg/dL   GFR, Estimated >60 >60 mL/min   Anion gap 9 5 - 15  Protime-INR  Result Value Ref Range   Prothrombin Time 13.2 11.4 - 15.2 seconds   INR 1.0 0.8 - 1.2  Brain natriuretic peptide  Result Value Ref Range   B Natriuretic Peptide 494.2 (H) 0.0 - 100.0 pg/mL  Lactic acid, plasma  Result Value Ref Range   Lactic Acid, Venous 1.2 0.5 - 1.9 mmol/L  Urinalysis, Complete w Microscopic Urine, Clean Catch  Result Value Ref Range   Color, Urine STRAW (A) YELLOW   APPearance CLEAR (A) CLEAR   Specific Gravity, Urine 1.032 (H) 1.005 - 1.030   pH 5.0 5.0 - 8.0   Glucose, UA NEGATIVE NEGATIVE mg/dL   Hgb urine dipstick NEGATIVE NEGATIVE   Bilirubin Urine NEGATIVE NEGATIVE   Ketones, ur NEGATIVE NEGATIVE mg/dL   Protein, ur NEGATIVE NEGATIVE mg/dL   Nitrite NEGATIVE NEGATIVE   Leukocytes,Ua NEGATIVE NEGATIVE   RBC / HPF 0-5 0 - 5 RBC/hpf   WBC, UA 0-5 0 - 5 WBC/hpf   Bacteria, UA NONE SEEN NONE SEEN   Squamous Epithelial / LPF 0-5 0 - 5   Mucus PRESENT   Strep pneumoniae urinary antigen  Result Value Ref Range   Strep Pneumo Urinary Antigen  NEGATIVE NEGATIVE  Legionella Pneumophila Serogp 1 Ur Ag  Result Value Ref Range   L. pneumophila Serogp 1 Ur Ag Negative Negative   Source of Sample URINE, RANDOM   Procalcitonin - Baseline  Result Value Ref Range   Procalcitonin <0.10 ng/mL  Thyroid Panel With TSH  Result Value Ref Range   TSH 4.890 (H) 0.450 - 4.500 uIU/mL   T4, Total 8.4 4.5 - 12.0 ug/dL   T3 Uptake Ratio 35 24 - 39 %   Free Thyroxine Index 2.9 1.2 - 4.9  Procalcitonin  Result Value Ref Range   Procalcitonin <0.10 ng/mL  CBC  Result Value Ref Range   WBC 13.8 (H) 4.0 - 10.5 K/uL   RBC 3.64 (L) 3.87 - 5.11 MIL/uL   Hemoglobin 10.8 (L) 12.0 - 15.0 g/dL   HCT 34.5 (L) 36.0 - 46.0 %   MCV 94.8 80.0 - 100.0 fL   MCH 29.7 26.0 - 34.0 pg   MCHC 31.3 30.0 - 36.0 g/dL   RDW 14.0 11.5 - 15.5 %   Platelets 390 150 - 400 K/uL   nRBC 0.0 0.0 - 0.2 %  Basic metabolic panel  Result Value Ref Range   Sodium 135 135 - 145 mmol/L   Potassium 4.3 3.5 - 5.1 mmol/L   Chloride 101 98 - 111 mmol/L   CO2 27 22 - 32 mmol/L   Glucose, Bld 238 (H) 70 - 99 mg/dL   BUN 16 8 - 23 mg/dL   Creatinine, Ser 0.69 0.44 - 1.00 mg/dL   Calcium 7.6 (L) 8.9 - 10.3 mg/dL   GFR, Estimated >60 >60 mL/min   Anion gap 7 5 - 15  Heparin level (unfractionated)  Result Value Ref Range   Heparin Unfractionated 0.29 (L) 0.30 - 0.70 IU/mL  Digoxin level  Result Value Ref Range   Digoxin Level 2.4 (H) 0.8 - 2.0 ng/mL  Glucose, capillary  Result Value Ref Range   Glucose-Capillary 245 (H) 70 - 99 mg/dL   Comment 1 Notify RN    Comment 2 Document in Chart   Heparin level (unfractionated)  Result Value Ref Range   Heparin Unfractionated 0.38 0.30 - 0.70 IU/mL  Glucose, capillary  Result Value Ref Range   Glucose-Capillary 212 (H) 70 - 99 mg/dL  Magnesium  Result Value Ref Range   Magnesium 1.4 (L) 1.7 - 2.4 mg/dL  Phosphorus  Result Value Ref Range   Phosphorus 2.6 2.5 - 4.6 mg/dL  Glucose, capillary  Result Value Ref Range    Glucose-Capillary 224 (H) 70 - 99 mg/dL  Heparin level (unfractionated)  Result Value Ref Range   Heparin Unfractionated 0.29 (L) 0.30 - 0.70 IU/mL  Glucose, capillary  Result Value Ref Range   Glucose-Capillary 265 (H) 70 - 99 mg/dL  Procalcitonin  Result Value Ref Range   Procalcitonin <0.10 ng/mL  CBC  Result Value Ref Range   WBC 14.1 (H) 4.0 - 10.5 K/uL   RBC 3.52 (L) 3.87 - 5.11 MIL/uL   Hemoglobin 10.7 (L) 12.0 - 15.0 g/dL   HCT 33.4 (L) 36.0 - 46.0 %   MCV 94.9 80.0 - 100.0 fL   MCH 30.4 26.0 - 34.0 pg   MCHC 32.0 30.0 - 36.0 g/dL   RDW 13.9 11.5 - 15.5 %   Platelets 396 150 - 400 K/uL   nRBC 0.0 0.0 - 0.2 %  Basic metabolic panel  Result Value Ref Range   Sodium 135 135 - 145 mmol/L   Potassium 4.0 3.5 - 5.1 mmol/L   Chloride 101 98 - 111 mmol/L   CO2 28 22 - 32 mmol/L   Glucose, Bld 232 (H) 70 - 99 mg/dL   BUN 14 8 - 23 mg/dL   Creatinine, Ser 0.85 0.44 - 1.00 mg/dL   Calcium 8.6 (L) 8.9 - 10.3 mg/dL   GFR, Estimated >60 >60 mL/min   Anion gap 6 5 - 15  Digoxin level  Result Value Ref Range   Digoxin Level 0.6 (L) 0.8 - 2.0 ng/mL  Magnesium  Result Value Ref Range   Magnesium 1.9 1.7 - 2.4 mg/dL  Heparin level (unfractionated)  Result Value Ref Range   Heparin Unfractionated 0.46 0.30 - 0.70 IU/mL  Glucose, capillary  Result Value Ref Range   Glucose-Capillary 250 (H) 70 - 99 mg/dL  Heparin level (unfractionated)  Result Value Ref Range   Heparin Unfractionated 0.33 0.30 - 0.70 IU/mL  Glucose, capillary  Result Value Ref Range   Glucose-Capillary 170 (H) 70 - 99 mg/dL  Glucose, capillary  Result Value Ref Range   Glucose-Capillary 370 (H) 70 - 99 mg/dL   Comment 1 Notify RN   Glucose, capillary  Result Value Ref Range   Glucose-Capillary 368 (H) 70 - 99 mg/dL  Magnesium  Result Value Ref Range   Magnesium 2.1 1.7 - 2.4 mg/dL  Glucose, capillary  Result Value Ref Range   Glucose-Capillary 267 (H) 70 - 99 mg/dL  Glucose, capillary  Result  Value Ref Range   Glucose-Capillary 175 (H) 70 - 99 mg/dL  Basic metabolic panel  Result Value Ref Range   Sodium 137 135 - 145 mmol/L   Potassium 4.4 3.5 - 5.1 mmol/L   Chloride 100 98 - 111 mmol/L   CO2 31 22 - 32 mmol/L   Glucose, Bld 203 (H) 70 - 99 mg/dL   BUN 10 8 - 23 mg/dL   Creatinine, Ser  0.78 0.44 - 1.00 mg/dL   Calcium 9.3 8.9 - 10.3 mg/dL   GFR, Estimated >60 >60 mL/min   Anion gap 6 5 - 15  CBG monitoring, ED  Result Value Ref Range   Glucose-Capillary 237 (H) 70 - 99 mg/dL  ECHOCARDIOGRAM COMPLETE  Result Value Ref Range   BP 99/71 mmHg   S' Lateral 3.58 cm  Troponin I (High Sensitivity)  Result Value Ref Range   Troponin I (High Sensitivity) 16 <18 ng/L  Troponin I (High Sensitivity)  Result Value Ref Range   Troponin I (High Sensitivity) 16 <18 ng/L      Assessment & Plan:   Problem List Items Addressed This Visit       Cardiovascular and Mediastinum   Aortic atherosclerosis ([place])    Noted on CT 12/11/19.  Continue daily statin.  Monitor closely for symptoms.      Atrial fibrillation with RVR ([place])    New diagnosis on 09/11/21 with underlying PNA.  At this time continue current medication regimen as ordered by cardiology and collaboration with cardiology.  Good rate control today.        Chronic HFrEF (heart failure with reduced ejection fraction) (HCC)    Chronic, ongoing with recent EF 25-30%, followed by cardiology.  Euvolemic on exam today.  Continue current medication regimen and collaboration with cardiology.  Recommend: - Reminded to call for an overnight weight gain of >2 pounds or a weekly weight gain of >5 pounds - not adding salt to food and read food labels. Reviewed the importance of keeping daily sodium intake to <2046m daily - Avoid Ibuprofen products      Hypertension associated with diabetes (HCC)    Chronic, stable with BP at goal today.  Recommend she continue to monitor BP at home regularly.  Focus on DASH diet.  Continue current  medication regimen and adjust as needed, refills sent as needed.  CMP, CBC today.  Return in 3 months.         Respiratory   COPD, moderate (HCC)    Chronic, ongoing.  Continue Trelegy which offers benefit of medication minimization and has benefited symptoms.  Continue to collaborate with Dr. FRaul Del recent notes reviewed.  No current daytime O2, at night only.  Recommend she follow-up with pulmonary ASAP.      HCAP (healthcare-associated pneumonia)    Refer to pneumonia with sepsis plan of care.      Relevant Orders   Ambulatory referral to Gastroenterology   Comprehensive metabolic panel   TSH   CBC with Differential/Platelet   T4, free   Magnesium   Sepsis due to pneumonia ([place] - Primary    Acute and improving at this time, patient reports improving cough.  Continue current treatment regimen at home.  She declines PT.  Recommend she use incentive spirometer every 4 hours at home.  Return in one week.  Referral to GI due to concern for aspiration.  Labs today.      Relevant Orders   Ambulatory referral to Gastroenterology     Genitourinary   Chronic kidney disease, stage III (moderate) ([place]    Noted on hospital labs, recheck CMP today.        Other   B12 deficiency    Chronic, stable.  Check B12 today and continue injections monthly.      Depression, major, single episode, in partial remission (HCC)    Chronic, ongoing with exacerbation due to health issues.  Denies SI/HI.  Mood well-controlled at  this time.  Continue current medication regimen and adjust as needed.  Refills as needed.  Consider trial of Duloxetine and transition off Effexor in future for dual pain and mood benefit.        Obesity    BMI 30.53.  Recommended eating smaller high protein, low fat meals more frequently and exercising 30 mins a day 5 times a week with a goal of 10-15lb weight loss in the next 3 months. Patient voiced their understanding and motivation to adhere to these  recommendations.       Other Visit Diagnoses     Elevated TSH       Noted recent labs, recheck today.        Follow up plan: Return in about 1 week (around 09/24/2021) for Pneumonia and A-fib.

## 2021-09-17 NOTE — Assessment & Plan Note (Signed)
Noted on hospital labs, recheck CMP today.

## 2021-09-18 LAB — CBC WITH DIFFERENTIAL/PLATELET
Basophils Absolute: 0.2 10*3/uL (ref 0.0–0.2)
Basos: 1 %
EOS (ABSOLUTE): 0.5 10*3/uL — ABNORMAL HIGH (ref 0.0–0.4)
Eos: 3 %
Hematocrit: 41.7 % (ref 34.0–46.6)
Hemoglobin: 13.5 g/dL (ref 11.1–15.9)
Immature Grans (Abs): 0.2 10*3/uL — ABNORMAL HIGH (ref 0.0–0.1)
Immature Granulocytes: 1 %
Lymphocytes Absolute: 3.3 10*3/uL — ABNORMAL HIGH (ref 0.7–3.1)
Lymphs: 21 %
MCH: 30 pg (ref 26.6–33.0)
MCHC: 32.4 g/dL (ref 31.5–35.7)
MCV: 93 fL (ref 79–97)
Monocytes Absolute: 1.1 10*3/uL — ABNORMAL HIGH (ref 0.1–0.9)
Monocytes: 7 %
Neutrophils Absolute: 10.4 10*3/uL — ABNORMAL HIGH (ref 1.4–7.0)
Neutrophils: 67 %
Platelets: 427 10*3/uL (ref 150–450)
RBC: 4.5 x10E6/uL (ref 3.77–5.28)
RDW: 13.5 % (ref 11.7–15.4)
WBC: 15.6 10*3/uL — ABNORMAL HIGH (ref 3.4–10.8)

## 2021-09-18 LAB — COMPREHENSIVE METABOLIC PANEL
ALT: 21 IU/L (ref 0–32)
AST: 14 IU/L (ref 0–40)
Albumin/Globulin Ratio: 1.3 (ref 1.2–2.2)
Albumin: 4.4 g/dL (ref 3.7–4.7)
Alkaline Phosphatase: 68 IU/L (ref 44–121)
BUN/Creatinine Ratio: 11 — ABNORMAL LOW (ref 12–28)
BUN: 10 mg/dL (ref 8–27)
Bilirubin Total: 0.5 mg/dL (ref 0.0–1.2)
CO2: 30 mmol/L — ABNORMAL HIGH (ref 20–29)
Calcium: 10 mg/dL (ref 8.7–10.3)
Chloride: 93 mmol/L — ABNORMAL LOW (ref 96–106)
Creatinine, Ser: 0.91 mg/dL (ref 0.57–1.00)
Globulin, Total: 3.4 g/dL (ref 1.5–4.5)
Glucose: 139 mg/dL — ABNORMAL HIGH (ref 70–99)
Potassium: 4.3 mmol/L (ref 3.5–5.2)
Sodium: 138 mmol/L (ref 134–144)
Total Protein: 7.8 g/dL (ref 6.0–8.5)
eGFR: 67 mL/min/{1.73_m2} (ref >59)

## 2021-09-18 LAB — MAGNESIUM: Magnesium: 1.7 mg/dL (ref 1.6–2.3)

## 2021-09-18 LAB — TSH: TSH: 4.03 u[IU]/mL (ref 0.450–4.500)

## 2021-09-18 LAB — T4, FREE: Free T4: 2.07 ng/dL — ABNORMAL HIGH (ref 0.82–1.77)

## 2021-09-18 NOTE — Progress Notes (Signed)
Good morning crew, please let Jordan Chan know her labs have returned: - Kidney function, creatinine and eGFR, remains normal, as is liver function, AST and ALT.   - White blood cell count is still elevated, however this can be related to recent infection and any steroids given.  We will recheck at visit next week. - Thyroid labs show normal TSH and mild elevation in Free T4 == we will continue to monitor this closely with your new medications. - Magnesium level normal.   Keep being inspiring!!  Love you.  Thank you for allowing me to participate in your care.  I appreciate you. Kindest regards, Lynsie Mcwatters

## 2021-09-22 NOTE — Patient Instructions (Signed)
Atrial Fibrillation Atrial fibrillation is a type of heartbeat that is irregular or fast. If you have this condition, your heart beats without any order. This makes it hard for your heart to pump blood in a normal way. Atrial fibrillation may come and go, or it may become a long-lasting problem. If this condition is not treated, it can put you at higher risk for stroke, heart failure, and other heart problems. What are the causes? This condition may be caused by diseases that damage the heart. They include: High blood pressure. Heart failure. Heart valve disease. Heart surgery. Other causes include: Diabetes. Thyroid disease. Being overweight. Kidney disease. Sometimes the cause is not known. What increases the risk? You are more likely to develop this condition if: You are older. You smoke. You exercise often and very hard. You have a family history of this condition. You are a man. You use drugs. You drink a lot of alcohol. You have lung conditions, such as emphysema, pneumonia, or COPD. You have sleep apnea. What are the signs or symptoms? Common symptoms of this condition include: A feeling that your heart is beating very fast. Chest pain or discomfort. Feeling short of breath. Suddenly feeling light-headed or weak. Getting tired easily during activity. Fainting. Sweating. In some cases, there are no symptoms. How is this treated? Treatment for this condition depends on underlying conditions and how you feel when you have atrial fibrillation. They include: Medicines to: Prevent blood clots. Treat heart rate or heart rhythm problems. Using devices, such as a pacemaker, to correct heart rhythm problems. Doing surgery to remove the part of the heart that sends bad signals. Closing an area where clots can form in the heart (left atrial appendage). In some cases, your doctor will treat other underlying conditions. Follow these instructions at home: Medicines Take  over-the-counter and prescription medicines only as told by your doctor. Do not take any new medicines without first talking to your doctor. If you are taking blood thinners: Talk with your doctor before you take any medicines that have aspirin or NSAIDs, such as ibuprofen, in them. Take your medicine exactly as told by your doctor. Take it at the same time each day. Avoid activities that could hurt or bruise you. Follow instructions about how to prevent falls. Wear a bracelet that says you are taking blood thinners. Or, carry a card that lists what medicines you take. Lifestyle   Do not use any products that have nicotine or tobacco in them. These include cigarettes, e-cigarettes, and chewing tobacco. If you need help quitting, ask your doctor. Eat heart-healthy foods. Talk with your doctor about the right eating plan for you. Exercise regularly as told by your doctor. Do not drink alcohol. Lose weight if you are overweight. Do not use drugs, including cannabis. General instructions If you have a condition that causes breathing to stop for a short period of time (apnea), treat it as told by your doctor. Keep a healthy weight. Do not use diet pills unless your doctor says they are safe for you. Diet pills may make heart problems worse. Keep all follow-up visits as told by your doctor. This is important. Contact a doctor if: You notice a change in the speed, rhythm, or strength of your heartbeat. You are taking a blood-thinning medicine and you get more bruising. You get tired more easily when you move or exercise. You have a sudden change in weight. Get help right away if:  You have pain in your chest or  your belly (abdomen). You have trouble breathing. You have side effects of blood thinners, such as blood in your vomit, poop (stool), or pee (urine), or bleeding that cannot stop. You have any signs of a stroke. "BE FAST" is an easy way to remember the main warning signs: B - Balance.  Signs are dizziness, sudden trouble walking, or loss of balance. E - Eyes. Signs are trouble seeing or a change in how you see. F - Face. Signs are sudden weakness or loss of feeling in the face, or the face or eyelid drooping on one side. A - Arms. Signs are weakness or loss of feeling in an arm. This happens suddenly and usually on one side of the body. S - Speech. Signs are sudden trouble speaking, slurred speech, or trouble understanding what people say. T - Time. Time to call emergency services. Write down what time symptoms started. You have other signs of a stroke, such as: A sudden, very bad headache with no known cause. Feeling like you may vomit (nausea). Vomiting. A seizure. These symptoms may be an emergency. Do not wait to see if the symptoms will go away. Get medical help right away. Call your local emergency services (911 in the [place] Do not drive yourself to the hospital. Summary Atrial fibrillation is a type of heartbeat that is irregular or fast. You are at higher risk of this condition if you smoke, are older, have diabetes, or are overweight. Follow your doctor's instructions about medicines, diet, exercise, and follow-up visits. Get help right away if you have signs or symptoms of a stroke. Get help right away if you cannot catch your breath, or you have chest pain or discomfort. This information is not intended to replace advice given to you by your health care provider. Make sure you discuss any questions you have with your health care provider. Document Revised: 01/12/2019 Document Reviewed: 01/12/2019 Elsevier Patient Education  [place].

## 2021-09-23 ENCOUNTER — Encounter: Payer: Self-pay | Admitting: Gastroenterology

## 2021-09-23 ENCOUNTER — Other Ambulatory Visit (INDEPENDENT_AMBULATORY_CARE_PROVIDER_SITE_OTHER): Payer: Medicare HMO

## 2021-09-23 ENCOUNTER — Other Ambulatory Visit: Payer: Self-pay

## 2021-09-23 ENCOUNTER — Ambulatory Visit (INDEPENDENT_AMBULATORY_CARE_PROVIDER_SITE_OTHER): Payer: Medicare HMO | Admitting: Gastroenterology

## 2021-09-23 ENCOUNTER — Telehealth: Payer: Self-pay

## 2021-09-23 VITALS — BP 124/66 | HR 82 | Temp 98.0°F | Ht 58.5 in | Wt 148.4 lb

## 2021-09-23 DIAGNOSIS — I428 Other cardiomyopathies: Secondary | ICD-10-CM

## 2021-09-23 DIAGNOSIS — R1310 Dysphagia, unspecified: Secondary | ICD-10-CM

## 2021-09-23 DIAGNOSIS — I5181 Takotsubo syndrome: Secondary | ICD-10-CM

## 2021-09-23 DIAGNOSIS — J9621 Acute and chronic respiratory failure with hypoxia: Secondary | ICD-10-CM

## 2021-09-23 DIAGNOSIS — Z1211 Encounter for screening for malignant neoplasm of colon: Secondary | ICD-10-CM

## 2021-09-23 DIAGNOSIS — I480 Paroxysmal atrial fibrillation: Secondary | ICD-10-CM | POA: Diagnosis not present

## 2021-09-23 DIAGNOSIS — I5022 Chronic systolic (congestive) heart failure: Secondary | ICD-10-CM | POA: Diagnosis not present

## 2021-09-23 MED ORDER — CLENPIQ 10-3.5-12 MG-GM -GM/160ML PO SOLN: 320.0000 mL | Freq: Once | ORAL | 0 refills | Status: AC

## 2021-09-23 NOTE — Telephone Encounter (Signed)
° °  [facility] Pre-operative Risk Assessment    Request for surgical clearance:  What type of surgery is being performed? Colonoscopy and EGD     When is this surgery scheduled? 10/28/21   Are there any medications that need to be held prior to surgery and how long?Eliquis    Practice name and name of physician performing surgery? Rohini Vanga   What is your office phone and fax number? [phone]  [phone]  Anesthesia type (None, local, MAC, general) ? General    Jordan Chan 09/23/2021, 1:44 PM  _________________________________________________________________   (provider comments below)

## 2021-09-23 NOTE — Progress Notes (Signed)
Cephas Darby, MD [address]  [place]  [place], [place] [address]  Main: [phone]  Fax: [phone]    Gastroenterology Consultation  Referring Provider:     [name], NP Primary Care Physician:  [name], NP Primary Gastroenterologist:  Dr. Cephas Darby Reason for Consultation:     Dysphagia        HPI:   Jordan Chan is a 71 y.o. female referred by Dr. [name], NP  for consultation & management of dysphagia.  Patient reports that for last 2 to 3 months, she has been experiencing difficulty with solids only.  She denies any difficulty swallowing to liquids and soft food.  She denies any weight loss.  She was recently discharged from [facility] on 09/14/2021 after being treated for COPD exacerbation.  She also had new onset of A-fib with RVR, started on Eliquis.  Currently rate controlled.  NSAIDs: None  Antiplts/Anticoagulants/Anti thrombotics: Eliquis for history of A-fib  GI Procedures:  Upper endoscopy and colonoscopy 01/01/2017 for history of iron deficiency anemia  - Normal esophagus. - Normal stomach. - Normal examined duodenum. - No specimens collected.  - Preparation of the colon was poor. - Stool in the entire examined colon. - No specimens collected.  Past Medical History:  Diagnosis Date   Arthritis    Asthma    Atrial fibrillation ([place])    Breast cancer ([place]) 1998   right breast ca with mastectomy and chemotherapy and radiation   Bronchitis    CHF (congestive heart failure) ([place])    "with Morphine withdrawal"   COPD (chronic obstructive pulmonary disease) ([place])    Diabetes mellitus without complication ([place])    Diverticulitis    diverticulosis also   Dyspnea    Endometriosis    GERD (gastroesophageal reflux disease)    History of shingles 2000-2005   Hypercholesteremia    Hypertension    IBS (irritable bowel syndrome)    Low back pain    a. Implanted morphine/bupivicaine/clonidine pump.   Neuropathy     Orthopnea    Oxygen dependent    uses at night   Personal history of chemotherapy    Personal history of radiation therapy    Pneumonia    pneumonia 5-6 times, history of bronchitis also   Scoliosis    Sleep apnea    does not use cpap   Stroke ([place]) 2010   TIA, 10 years ago   Withdrawal from sedative drug ([place])    withdrawal from morphine when pump batteries died    Past Surgical History:  Procedure Laterality Date   ABDOMINAL HYSTERECTOMY  1987   BACK SURGERY     Tailbone removed following fracture   BREAST SURGERY Right    mastectomy   CARDIAC CATHETERIZATION     CATARACT EXTRACTION W/PHACO Right 05/19/2019   Procedure: CATARACT EXTRACTION PHACO AND INTRAOCULAR LENS PLACEMENT ([place]), RIGHT, DIABETIC;  Surgeon: Marchia Meiers, MD;  Location: [facility] ORS;  Service: Ophthalmology;  Laterality: Right;  Lot # [id number] [place]: 00:43.8 CDE: 4.59   CATARACT EXTRACTION W/PHACO Left 06/16/2019   Procedure: CATARACT EXTRACTION PHACO AND INTRAOCULAR LENS PLACEMENT ([place]) LEFT VISION BLUE DIABETIC;  Surgeon: Marchia Meiers, MD;  Location: [facility] ORS;  Service: Ophthalmology;  Laterality: Left;  Lot #[id number] [place]: 00:46.9 CDE: 6.53   COCCYX REMOVAL     COLONOSCOPY WITH PROPOFOL N/A 01/01/2017   Procedure: COLONOSCOPY WITH PROPOFOL;  Surgeon: Jonathon Bellows, MD;  Location: [facility] ENDOSCOPY;  Service: Endoscopy;  Laterality: N/A;   ELBOW ARTHROSCOPY WITH TENDON RECONSTRUCTION     ESOPHAGOGASTRODUODENOSCOPY (EGD) WITH PROPOFOL N/A 01/01/2017   Procedure: ESOPHAGOGASTRODUODENOSCOPY (EGD) WITH PROPOFOL;  Surgeon: Jonathon Bellows, MD;  Location: [facility] ENDOSCOPY;  Service: Endoscopy;  Laterality: N/A;   INTRATHECAL PUMP IMPLANT     LEFT HEART CATH AND CORONARY ANGIOGRAPHY N/A 04/28/2017   Procedure: LEFT HEART CATH AND CORONARY ANGIOGRAPHY;  Surgeon: [name], MD;  Location: [facility] CV LAB;  Service: Cardiovascular;  Laterality: N/A;   MASTECTOMY Right 06/1997   morphine pump  2011   PLANTAR FASCIA  RELEASE     TRIGGER FINGER RELEASE      Current Outpatient Medications:    ACCU-CHEK AVIVA PLUS test strip, TEST THREE TIMES DAILY, Disp: 300 strip, Rfl: 3   Accu-Chek Softclix Lancets lancets, TEST BLOOD SUGAR THREE TIMES DAILY, Disp: 300 each, Rfl: 1   albuterol (PROVENTIL HFA;VENTOLIN HFA) 108 (90 BASE) MCG/ACT inhaler, Inhale 2 puffs into the lungs every 6 (six) hours as needed for wheezing or shortness of breath. , Disp: , Rfl:    Alcohol Swabs (B-D SINGLE USE SWABS REGULAR) PADS, USE TWICE DAILY  WITH  SUGAR  CHECKS, Disp: 200 each, Rfl: 2   amiodarone (PACERONE) 200 MG tablet, Take 2 tablets (400 mg total) by mouth 2 (two) times daily. Then from 09/19/21 take 200 mg twice a day and  From 09/26/21 take 200 mg daily, Disp: 60 tablet, Rfl: 2   apixaban (ELIQUIS) 5 MG TABS tablet, Take 1 tablet (5 mg total) by mouth 2 (two) times daily., Disp: 60 tablet, Rfl: 2   atorvastatin (LIPITOR) 20 MG tablet, Take 1 tablet (20 mg total) by mouth daily., Disp: 90 tablet, Rfl: 4   Blood Glucose Monitoring Suppl (TRUE METRIX METER) w/Device KIT, Use to check blood sugar 4 times a day, Disp: 1 kit, Rfl: 4   cholecalciferol (VITAMIN D3) 25 MCG (1000 UNIT) tablet, Take 1,000 Units by mouth daily., Disp: , Rfl:    Cyanocobalamin 1000 MCG/ML KIT, Inject 1,000 mcg as directed every 30 (thirty) days., Disp: , Rfl:    cyclobenzaprine (FLEXERIL) 5 MG tablet, Take 5 mg by mouth 2 (two) times daily as needed for muscle spasms. Takes very rarely, Disp: , Rfl:    dextromethorphan (DELSYM) 30 MG/5ML liquid, Take 2.5 mLs (15 mg total) by mouth 2 (two) times daily., Disp: 89 mL, Rfl: 0   dextromethorphan (DELSYM) 30 MG/5ML liquid, Take 2.5 mLs by mouth 2 (two) times daily as needed for cough., Disp: , Rfl:    diclofenac (VOLTAREN) 50 MG EC tablet, TAKE 1 TABLET THREE TIMES DAILY, Disp: 270 tablet, Rfl: 0   Dulaglutide (TRULICITY) 4.5 WE/9.9BZ SOPN, Inject 4.5 mg as directed once a week., Disp: 6 mL, Rfl: 2   folic acid  (FOLVITE) 1 MG tablet, TAKE 1 TABLET EVERY DAY, Disp: 60 tablet, Rfl: 3   furosemide (LASIX) 40 MG tablet, TAKE 1 TABLET EVERY DAY, Disp: 90 tablet, Rfl: 0   gabapentin (NEURONTIN) 800 MG tablet, Take 1 tablet (800 mg total) by mouth 3 (three) times daily., Disp: 270 tablet, Rfl: 4   insulin degludec (TRESIBA FLEXTOUCH) 100 UNIT/ML FlexTouch Pen, Inject 50 Units into the skin at bedtime., Disp: , Rfl:    lactulose (CHRONULAC) 10 GM/15ML solution, Take 45 mLs (30 g total) by mouth daily as needed for mild constipation or severe constipation., Disp: 1892 mL, Rfl: 0   losartan (COZAAR) 25 MG tablet, Take 0.5 tablets (12.5 mg total) by mouth  daily., Disp: 45 tablet, Rfl: 3   metFORMIN (GLUCOPHAGE) 1000 MG tablet, Take 1 tablet (1,000 mg total) by mouth 2 (two) times daily with a meal., Disp: 180 tablet, Rfl: 4   metoprolol succinate (TOPROL-XL) 25 MG 24 hr tablet, TAKE 1/2 TABLET EVERY DAY, Disp: 45 tablet, Rfl: 0   montelukast (SINGULAIR) 10 MG tablet, Take 10 mg by mouth at bedtime., Disp: , Rfl:    ondansetron (ZOFRAN) 4 MG tablet, Take 1 tablet (4 mg total) by mouth every 8 (eight) hours as needed for nausea or vomiting., Disp: 40 tablet, Rfl: 4   PAIN MANAGEMENT INTRATHECAL, IT, PUMP, 1 each by Intrathecal route. Intrathecal (IT) medication:  Morphine Patient does not remember current. Adjusted every 2.5 months at [facility] in [place]., Disp: , Rfl:    pantoprazole (PROTONIX) 40 MG tablet, Take 1 tablet (40 mg total) by mouth daily., Disp: 30 tablet, Rfl: 0   TRELEGY ELLIPTA 100-62.5-25 MCG/INH AEPB, INHALE 1 PUFF INTO THE LUNGS DAILY., Disp: 180 each, Rfl: 4   venlafaxine XR (EFFEXOR-XR) 150 MG 24 hr capsule, Take 1 capsule (150 mg total) by mouth daily., Disp: 90 capsule, Rfl: 4  Family History  Problem Relation Age of Onset   Cancer Mother 57       lung   Thyroid disease Mother    Cancer Father 66       Pancreatic   Hypertension Sister    Cancer Sister        "cancer  on face"   Hyperlipidemia Sister    Osteoporosis Maternal Grandmother    Hyperlipidemia Son    Seizures Son    Cancer Paternal Grandmother        colon   Arthritis Paternal Grandfather    Breast cancer Neg Hx      Social History   Tobacco Use   Smoking status: Former    Packs/day: 1.00    Years: 30.00    Pack years: 30.00    Types: Cigarettes    Quit date: 04/30/2004    Years since quitting: 17.4   Smokeless tobacco: Never  Vaping Use   Vaping Use: Never used  Substance Use Topics   Alcohol use: No   Drug use: No    Allergies as of 09/23/2021 - Review Complete 09/23/2021  Allergen Reaction Noted   Other Palpitations 03/19/2015   Pain patch [menthol] Anaphylaxis 03/19/2015   Avelox [moxifloxacin hcl in nacl] Other (See Comments) 06/18/2015   Doxycycline Diarrhea 06/12/2017   Erythromycin Nausea Only and Other (See Comments) 01/13/2014   Fentanyl Nausea Only and Rash 06/18/2015   Moxifloxacin hcl Other (See Comments) 06/18/2015   Oxycontin [oxycodone] Hives 10/01/2015   Ozempic [semaglutide] Nausea Only 02/05/2018    Review of Systems:    All systems reviewed and negative except where noted in HPI.   Physical Exam:  BP 124/66 (BP Location: Left Arm, Patient Position: Sitting, Cuff Size: Normal)    Pulse 82    Temp 98 F (36.7 C) (Oral)    Ht 4' 10.5" (1.486 m)    Wt 148 lb 6 oz (67.3 kg)    LMP  (LMP Unknown)    BMI 30.48 kg/m  No LMP recorded (lmp unknown). Patient has had a hysterectomy.  General:   Alert,  Well-developed, well-nourished, pleasant and cooperative in NAD Head:  Normocephalic and atraumatic. Eyes:  Sclera clear, no icterus.   Conjunctiva pink. Ears:  Normal auditory acuity. Nose:  No deformity, discharge, or lesions. Mouth:  No deformity or lesions,oropharynx pink & moist. Neck:  Supple; no masses or thyromegaly. Lungs:  Respirations even and unlabored.  Clear throughout to auscultation.   No wheezes, crackles, or rhonchi. No acute  distress. Heart:  Regular rate and rhythm; no murmurs, clicks, rubs, or gallops. Abdomen:  Normal bowel sounds. Soft, non-tender and non-distended without masses, hepatosplenomegaly or hernias noted.  No guarding or rebound tenderness.   Rectal: Not performed Msk:  Symmetrical without gross deformities. Good, equal movement & strength bilaterally. Pulses:  Normal pulses noted. Extremities:  No clubbing or edema.  No cyanosis. Neurologic:  Alert and oriented x3;  grossly normal neurologically. Skin:  Intact without significant lesions or rashes. No jaundice. Lymph Nodes:  No significant cervical adenopathy. Psych:  Alert and cooperative. Normal mood and affect.  Imaging Studies: Reviewed Diverticulosis without diverticulitis  Assessment and Plan:   Jordan Chan is a 71 y.o. female with history of COPD, not on home oxygen, metabolic syndrome, A-fib new onset in 09/2021 started on Eliquis, rate controlled is seen in consultation for 2 to 3 months history of dysphagia to solids only  Solid food dysphagia Eliquis needs to be uninterrupted at least for 4 weeks per cardiology recommendations Proceed with EGD in late March or early April 2023 Highly recommended patient to avoid hard solid foods Continue Protonix 40 mg once a day  Colon cancer screening Recommend colonoscopy with 2-day prep  I have discussed alternative options, risks & benefits,  which include, but are not limited to, bleeding, infection, perforation,respiratory complication & drug reaction.  The patient agrees with this plan & written consent will be obtained.     Follow up based on the above work-up   Cephas Darby, MD

## 2021-09-24 ENCOUNTER — Telehealth: Payer: Self-pay | Admitting: *Deleted

## 2021-09-24 LAB — BASIC METABOLIC PANEL
BUN/Creatinine Ratio: 14 (ref 12–28)
BUN: 15 mg/dL (ref 8–27)
CO2: 29 mmol/L (ref 20–29)
Calcium: 9.1 mg/dL (ref 8.7–10.3)
Chloride: 94 mmol/L — ABNORMAL LOW (ref 96–106)
Creatinine, Ser: 1.05 mg/dL — ABNORMAL HIGH (ref 0.57–1.00)
Glucose: 106 mg/dL — ABNORMAL HIGH (ref 70–99)
Potassium: 4.8 mmol/L (ref 3.5–5.2)
Sodium: 139 mmol/L (ref 134–144)
eGFR: 57 mL/min/{1.73_m2} — ABNORMAL LOW (ref >59)

## 2021-09-24 NOTE — Telephone Encounter (Signed)
Patient calling to discuss recent testing results  ° °Please call  ° °

## 2021-09-24 NOTE — Telephone Encounter (Signed)
Left voicemail message to call back for review of results.  

## 2021-09-24 NOTE — Telephone Encounter (Addendum)
° °  Patient Name: Jordan Chan  DOB: [birth year] MRN: [id number]  Primary Cardiologist: [name], MD  Chart reviewed as part of pre-operative protocol coverage.   The patient already has an upcoming appointment scheduled 10/24/21 with [name] PA-C for ongoing cardiac management at which time this clearance and anticoagulation recommendation can be addressed in case there are any new issues addressed that would impact pre-operative recommendations. (Procedure date of 10/28/21 falls after appointment.)  I added preop FYI to appointment notes so that provider is aware to address at time of OV. Per office protocol, the provider seeing this patient should forward their finalized clearance decision to requesting party below. Will route as FYI to requesting provider. Will remove from preop box as separate preop APP input not necessary at this time.  Charlie Pitter, PA-C 09/24/2021, 11:10 AM

## 2021-09-24 NOTE — Telephone Encounter (Signed)
Patient with diagnosis of afib on Eliquis for anticoagulation.    Procedure:  Colonoscopy and EGD          Date of procedure: 10/28/21  Of  note patient has a hx of PE following hospitalization for PNA. Her PMH mentions TIA in 2010   CHA2DS2-VASc Score = 8   This indicates a 10.8% annual risk of stroke. The patient's score is based upon: CHF History: 1 HTN History: 1 Diabetes History: 1 Stroke History: 2 Vascular Disease History: 1 Age Score: 1 Gender Score: 1      CrCl 42 ml/min  Per office protocol, patient can hold Eliquis for 1 day prior to procedure.

## 2021-09-24 NOTE — Telephone Encounter (Signed)
Reviewed results and recommendations with patient. She verbalized understanding with no further questions at this time.  ?

## 2021-09-24 NOTE — Telephone Encounter (Signed)
-----   Message from Rise Mu, PA-C sent at 09/24/2021 12:45 PM EST ----- Renal function is largely stable on losartan.  Potassium normal.  Please have her slightly increase her water intake.  Follow-up as planned next month.

## 2021-09-25 DIAGNOSIS — M5442 Lumbago with sciatica, left side: Secondary | ICD-10-CM | POA: Diagnosis not present

## 2021-09-25 DIAGNOSIS — Z978 Presence of other specified devices: Secondary | ICD-10-CM | POA: Diagnosis not present

## 2021-09-25 DIAGNOSIS — M792 Neuralgia and neuritis, unspecified: Secondary | ICD-10-CM | POA: Diagnosis not present

## 2021-09-25 DIAGNOSIS — M542 Cervicalgia: Secondary | ICD-10-CM | POA: Diagnosis not present

## 2021-09-25 NOTE — Telephone Encounter (Signed)
Informed patient that they wanted her to stop the Eliquis 1 day prior to procedure. She verbalized understanding

## 2021-09-26 ENCOUNTER — Other Ambulatory Visit: Payer: Self-pay

## 2021-09-26 ENCOUNTER — Encounter: Payer: Self-pay | Admitting: Nurse Practitioner

## 2021-09-26 ENCOUNTER — Ambulatory Visit (INDEPENDENT_AMBULATORY_CARE_PROVIDER_SITE_OTHER): Payer: Medicare HMO | Admitting: Nurse Practitioner

## 2021-09-26 ENCOUNTER — Telehealth: Payer: Self-pay | Admitting: Internal Medicine

## 2021-09-26 ENCOUNTER — Ambulatory Visit
Admission: RE | Admit: 2021-09-26 | Discharge: 2021-09-26 | Disposition: A | Discharge status: Home / Self Care | Payer: Medicare HMO | Source: Ambulatory Visit | Attending: Nurse Practitioner | Admitting: Nurse Practitioner

## 2021-09-26 ENCOUNTER — Ambulatory Visit
Admission: RE | Admit: 2021-09-26 | Discharge: 2021-09-26 | Disposition: A | Discharge status: Home / Self Care | Payer: Medicare HMO | Attending: Nurse Practitioner | Admitting: Nurse Practitioner

## 2021-09-26 VITALS — BP 95/59 | HR 79 | Temp 98.0°F | Ht 58.5 in | Wt 148.0 lb

## 2021-09-26 DIAGNOSIS — J189 Pneumonia, unspecified organism: Secondary | ICD-10-CM

## 2021-09-26 DIAGNOSIS — J449 Chronic obstructive pulmonary disease, unspecified: Secondary | ICD-10-CM | POA: Diagnosis not present

## 2021-09-26 DIAGNOSIS — A419 Sepsis, unspecified organism: Secondary | ICD-10-CM | POA: Diagnosis present

## 2021-09-26 DIAGNOSIS — I4891 Unspecified atrial fibrillation: Secondary | ICD-10-CM

## 2021-09-26 MED ORDER — LIDOCAINE VISCOUS HCL 2 % MT SOLN: 15.0000 mL | OROMUCOSAL | 0 refills | Status: DC | PRN

## 2021-09-26 NOTE — Assessment & Plan Note (Signed)
Chronic, ongoing.  Continue Trelegy which offers benefit of medication minimization and has benefited symptoms.  Continue to collaborate with [name], recent notes reviewed.  No current daytime O2, at night only.  Recommend she follow-up with pulmonary ASAP.

## 2021-09-26 NOTE — Telephone Encounter (Signed)
Pt c/o medication issue:  1. Name of Medication: Amiodarone 200 mg  2. How are you currently taking this medication (dosage and times per day)? 2 tablets twice a day  3. Are you having a reaction (difficulty breathing--STAT)? Feeling sick, no appetit, feeling nervous  4. What is your medication issue? Ulcers in mouth

## 2021-09-26 NOTE — Telephone Encounter (Signed)
Attempted to call pt to discuss further. No answer. Unable to leave voicemail.  Will try to reach pt at a later time.   Will forward to Dr. Saunders Revel in the meantime with message below for further recc.

## 2021-09-26 NOTE — Progress Notes (Signed)
BP (!) 95/59 (BP Location: Left Arm, Cuff Size: Normal)    Pulse 79    Temp 98 F (36.7 C) (Oral)    Ht 4' 10.5" (1.486 m)    Wt 148 lb (67.1 kg)    LMP  (LMP Unknown)    SpO2 96%    BMI 30.41 kg/m    Subjective:    [name], female    DOB: Apr 12, 1951, 71 y.o.   MRN: [id number]  HPI: Jordan Chan is a 71 y.o. female  Chief Complaint  Patient presents with   Pneumonia   Atrial Fibrillation   Oral Pain    Patient states she noticed her mouth is raw for the past couple of days and has gotten worse yesterday.    ATRIAL FIBRILLATION & PNEUMONIA Recent visit with GI on 09/23/21 -- plan is to perform EGD and colonoscopy at end of March or early April.  Recent visit with cardiology on 09/16/21 -- she is on Amiodarone and reports this is causing side effects, very fatigued, anxious, jittery, and mouth sore.  She has noticed these since starting Amiodarone. Atrial fibrillation status: stable Satisfied with current treatment: yes  Medication side effects:  no Medication compliance: good compliance Etiology of atrial fibrillation:  Palpitations:   occasional, but improved Chest pain:  no Dyspnea on exertion:  occasional  per baseline Orthopnea:  no Syncope:  no Edema:  no Ventricular rate control:  Amiodarone Anti-coagulation: long acting   Relevant past medical, surgical, family and social history reviewed and updated as indicated. Interim medical history since our last visit reviewed. Allergies and medications reviewed and updated.  Review of Systems  Constitutional:  Negative for activity change, appetite change, diaphoresis, fatigue and fever.  Respiratory:  Positive for cough (improving) and shortness of breath (at baseline). Negative for chest tightness and wheezing.   Cardiovascular:  Negative for chest pain, palpitations and leg swelling.  Gastrointestinal: Negative.   Endocrine: Negative for heat intolerance, polydipsia, polyphagia and polyuria.   Neurological: Negative.   Psychiatric/Behavioral: Negative.  Negative for decreased concentration, self-injury, sleep disturbance and suicidal ideas. The patient is not nervous/anxious.    Per HPI unless specifically indicated above     Objective:    BP (!) 95/59 (BP Location: Left Arm, Cuff Size: Normal)    Pulse 79    Temp 98 F (36.7 C) (Oral)    Ht 4' 10.5" (1.486 m)    Wt 148 lb (67.1 kg)    LMP  (LMP Unknown)    SpO2 96%    BMI 30.41 kg/m   Wt Readings from Last 3 Encounters:  09/26/21 148 lb (67.1 kg)  09/23/21 148 lb 6 oz (67.3 kg)  09/17/21 148 lb 9.6 oz (67.4 kg)    Physical Exam Vitals and nursing note reviewed.  Constitutional:      General: She is awake. She is not in acute distress.    Appearance: She is well-developed and well-groomed. She is not ill-appearing.  HENT:     Head: Normocephalic.     Right Ear: Hearing normal.     Left Ear: Hearing normal.     Mouth/Throat:     Lips: Pink.     Mouth: Mucous membranes are moist. Oral lesions (ulcers inside of anterior mouth) present.  Eyes:     General: Lids are normal.        Right eye: No discharge.        Left eye: No discharge.  Conjunctiva/sclera: Conjunctivae normal.     Pupils: Pupils are equal, round, and reactive to light.  Neck:     Thyroid: No thyromegaly.     Vascular: No carotid bruit.  Cardiovascular:     Rate and Rhythm: Normal rate and regular rhythm.     Heart sounds: Normal heart sounds. No murmur heard.   No gallop.  Pulmonary:     Effort: Pulmonary effort is normal. No accessory muscle usage or respiratory distress.     Breath sounds: No wheezing or rhonchi.     Comments: Clear throughout on exam today, improved from previous. Abdominal:     General: Bowel sounds are normal.     Palpations: Abdomen is soft.  Musculoskeletal:     Cervical back: Normal range of motion and neck supple.     Right lower leg: No edema.     Left lower leg: No edema.  Lymphadenopathy:     Cervical: No  cervical adenopathy.  Skin:    General: Skin is warm and dry.  Neurological:     Mental Status: She is alert and oriented to person, place, and time.  Psychiatric:        Attention and Perception: Attention normal.        Mood and Affect: Affect is tearful.        Speech: Speech normal.        Behavior: Behavior normal. Behavior is cooperative.        Thought Content: Thought content normal.    Results for orders placed or performed in visit on 17/61/60  Basic metabolic panel  Result Value Ref Range   Glucose 106 (H) 70 - 99 mg/dL   BUN 15 8 - 27 mg/dL   Creatinine, Ser 1.05 (H) 0.57 - 1.00 mg/dL   eGFR 57 (L) >59 mL/min/1.73   BUN/Creatinine Ratio 14 12 - 28   Sodium 139 134 - 144 mmol/L   Potassium 4.8 3.5 - 5.2 mmol/L   Chloride 94 (L) 96 - 106 mmol/L   CO2 29 20 - 29 mmol/L   Calcium 9.1 8.7 - 10.3 mg/dL      Assessment & Plan:   Problem List Items Addressed This Visit       Cardiovascular and Mediastinum   Atrial fibrillation with RVR ([place])    New diagnosis on 09/11/21 with underlying PNA.  At this time continue current medication regimen as ordered by cardiology and collaboration with cardiology.  Good rate control today.  Have reached out to Dr. Saunders Revel via secure chat and alerted him to current side effects with Amiodarone, patient to reach out to their office today.      Relevant Orders   T4, free   TSH   CBC with Differential/Platelet   Basic metabolic panel     Respiratory   COPD, moderate (HCC)    Chronic, ongoing.  Continue Trelegy which offers benefit of medication minimization and has benefited symptoms.  Continue to collaborate with [name], recent notes reviewed.  No current daytime O2, at night only.  Recommend she follow-up with pulmonary ASAP.      Sepsis due to pneumonia [facility]) - Primary    Acute and improving at this time, patient reports improving cough.  Continue current treatment regimen at home.  She declines PT.  Recommend she continue to use  incentive spirometer every 4 hours at home.  Return in 2 weeks.  Lab recheck today as CBC continues to show elevation.      Relevant  Orders   CBC with Differential/Platelet   Basic metabolic panel   DG Chest 2 View     Follow up plan: Return in about 2 weeks (around 10/10/2021) for A-FIB, PNA.

## 2021-09-26 NOTE — Assessment & Plan Note (Signed)
Acute and improving at this time, patient reports improving cough.  Continue current treatment regimen at home.  She declines PT.  Recommend she continue to use incentive spirometer every 4 hours at home.  Return in 2 weeks.  Lab recheck today as CBC continues to show elevation.

## 2021-09-26 NOTE — Assessment & Plan Note (Signed)
New diagnosis on 09/11/21 with underlying PNA.  At this time continue current medication regimen as ordered by cardiology and collaboration with cardiology.  Good rate control today.  Have reached out to Dr. Saunders Revel via secure chat and alerted him to current side effects with Amiodarone, patient to reach out to their office today.

## 2021-09-27 LAB — TSH: TSH: 3.74 u[IU]/mL (ref 0.450–4.500)

## 2021-09-27 LAB — BASIC METABOLIC PANEL
BUN/Creatinine Ratio: 14 (ref 12–28)
BUN: 18 mg/dL (ref 8–27)
CO2: 26 mmol/L (ref 20–29)
Calcium: 8.7 mg/dL (ref 8.7–10.3)
Chloride: 93 mmol/L — ABNORMAL LOW (ref 96–106)
Creatinine, Ser: 1.3 mg/dL — ABNORMAL HIGH (ref 0.57–1.00)
Glucose: 124 mg/dL — ABNORMAL HIGH (ref 70–99)
Potassium: 4 mmol/L (ref 3.5–5.2)
Sodium: 140 mmol/L (ref 134–144)
eGFR: 44 mL/min/{1.73_m2} — ABNORMAL LOW (ref >59)

## 2021-09-27 LAB — CBC WITH DIFFERENTIAL/PLATELET
Basophils Absolute: 0.1 10*3/uL (ref 0.0–0.2)
Basos: 1 %
EOS (ABSOLUTE): 0.3 10*3/uL (ref 0.0–0.4)
Eos: 2 %
Hematocrit: 36.9 % (ref 34.0–46.6)
Hemoglobin: 11.9 g/dL (ref 11.1–15.9)
Immature Grans (Abs): 0 10*3/uL (ref 0.0–0.1)
Immature Granulocytes: 0 %
Lymphocytes Absolute: 2.6 10*3/uL (ref 0.7–3.1)
Lymphs: 21 %
MCH: 30.2 pg (ref 26.6–33.0)
MCHC: 32.2 g/dL (ref 31.5–35.7)
MCV: 94 fL (ref 79–97)
Monocytes Absolute: 0.8 10*3/uL (ref 0.1–0.9)
Monocytes: 7 %
Neutrophils Absolute: 8.6 10*3/uL — ABNORMAL HIGH (ref 1.4–7.0)
Neutrophils: 69 %
Platelets: 316 10*3/uL (ref 150–450)
RBC: 3.94 x10E6/uL (ref 3.77–5.28)
RDW: 13.8 % (ref 11.7–15.4)
WBC: 12.5 10*3/uL — ABNORMAL HIGH (ref 3.4–10.8)

## 2021-09-27 LAB — T4, FREE: Free T4: 1.95 ng/dL — ABNORMAL HIGH (ref 0.82–1.77)

## 2021-09-27 NOTE — Telephone Encounter (Signed)
Please have Jordan Chan stop taking amiodarone and refer her to EP for further management.  [name], MD [facility]

## 2021-09-27 NOTE — Telephone Encounter (Signed)
Called and spoke with the pt. Pt was seen by [name], PA-C 09/16/21. Pt had not started Amiodarone at that time. Pt confirms she began Amiodarone loading dose the day after appointment on 09/17/21. Pt began taking maintenance dose Amiodarone 200 mg daily yesterday 09/26/21.   Pt confirms that she is taking with food. Pt takes daily dose at lunch time.   Pt states that ulcers in mouth began 3 days ago.  Sounds like ulcers are related to dry mouth that began recently.  Pt is not sure if dry mouth is related to Amiodarone or Losartan that pt also recently started.  Ulcers are not bleeding. States they are on cheek and tip of tongue is very red and sore.  Xylocaine viscous was given by PCP for ulcers and pt has also started Biotene for dry mouth.   Pt reports that fatigue, and appetite have improved "a tiny bit". Pt currently out to eat at time of call.  Pt reports nervousness as significant side effect, states "I can hardly talk I feel so nervous after taking Amiodarone."  Pt also reports that she does want to continue taking Amiodarone 200 mg daily.  States now that she is on maintenance dose that she would like to keep taking if symptoms continue to improve.  Per pt "I do feel I can continue on daily dose."  Advised pt to continue to monitor symptoms and let us know if worsen.  Will forward to [facility] to make aware and for any further recc.  Pt has no further concerns at this time.

## 2021-09-27 NOTE — Progress Notes (Signed)
Good morning crew, please let Christene know her labs have returned.  Her white blood cell count is still elevated, but trending down to normal range, which is great news.  Kidney function shows a little more decline this check, please ensure you are getting good fluid intake at home and add a little lemon to this.  We will recheck next visit.  Thyroid labs are showing trend down in Free T4 and normal TSH, we will continue to monitor this closely with Amiodarone on board.  For now continue all current medications.  Any questions?  Big hugs sent. Keep being amazing!!  Thank you for allowing me to participate in your care.  I appreciate you. Kindest regards, Dayzee Trower

## 2021-09-27 NOTE — Telephone Encounter (Signed)
Called and spoke to pt. Notified of Dr. Darnelle Bos recc below.   Pt voiced understanding.   Pt will stop amiodarone.   EP referral placed.   Pt aware our office will contact her to schedule appointment.   Advised pt to let us know of any further symptoms.

## 2021-09-29 NOTE — Progress Notes (Signed)
Good morning, please let Jordan Chan know I reviewed her imaging report + reviewed her past imaging and current imaging for comparison = overall pneumonia appears to be improving from previous imaging which is good news.  At this time continue to ensure plenty of rest and hydration + ensure to schedule a follow-up with pulmonary provider.  Any questions for me?

## 2021-09-30 ENCOUNTER — Other Ambulatory Visit: Payer: Self-pay | Admitting: Nurse Practitioner

## 2021-10-01 NOTE — Telephone Encounter (Signed)
Consult placed with Dr. Quentin Ore 11/06/21.

## 2021-10-01 NOTE — Telephone Encounter (Signed)
Requested Prescriptions  Pending Prescriptions Disp Refills   Accu-Chek Softclix Lancets lancets [Pharmacy Med Name: ACCU-CHEK SOFTCLIX LANCETS] 300 each 1    Sig: TEST BLOOD SUGAR THREE TIMES DAILY     Endocrinology: Diabetes - Testing Supplies Passed - 09/30/2021  6:39 AM      Passed - Valid encounter within last 12 months    Recent Outpatient Visits          5 days ago Sepsis due to pneumonia [facility])   [place], Jolene T, NP   2 weeks ago Sepsis due to pneumonia ([place])   [place], Jolene T, NP   1 month ago COPD, moderate ([place])   [place], [place] T, NP   2 months ago Type 2 diabetes, controlled, with neuropathy ([place])   [place], [place] T, NP   3 months ago Urinary symptom or sign   [place], [name], NP      Future Appointments            In 3 weeks [name], [name], NP [employer], [place]   In 3 weeks Dunn, Ryan M, PA-C [place]   In 2 months  [employer], [facility]

## 2021-10-03 DIAGNOSIS — J45909 Unspecified asthma, uncomplicated: Secondary | ICD-10-CM | POA: Diagnosis not present

## 2021-10-03 DIAGNOSIS — J449 Chronic obstructive pulmonary disease, unspecified: Secondary | ICD-10-CM | POA: Diagnosis not present

## 2021-10-09 ENCOUNTER — Ambulatory Visit: Payer: Medicare HMO | Admitting: Internal Medicine

## 2021-10-14 ENCOUNTER — Other Ambulatory Visit: Payer: Self-pay | Admitting: Oncology

## 2021-10-15 ENCOUNTER — Ambulatory Visit: Payer: Medicare HMO | Admitting: Nurse Practitioner

## 2021-10-19 NOTE — Patient Instructions (Signed)
Community-Acquired Pneumonia, Adult °Pneumonia is an infection of the lungs. It causes irritation and swelling in the airways of the lungs. Mucus and fluid may also build up inside the airways. This may cause coughing and trouble breathing. °One type of pneumonia can happen while you are in a hospital. A different type can happen when you are not in a hospital (community-acquired pneumonia). °What are the causes? °This condition is caused by germs (viruses, bacteria, or fungi). Some types of germs can spread from person to person. Pneumonia is not thought to spread from person to person. °What increases the risk? °You are more likely to develop this condition if: °You have a long-term (chronic) disease, such as: °Disease of the lungs. This may be chronic obstructive pulmonary disease (COPD) or asthma. °Heart failure. °Cystic fibrosis. °Diabetes. °Kidney disease. °Sickle cell disease. °HIV. °You have other health problems, such as: °Your body's defense system (immune system) is weak. °A condition that may cause you to breathe in fluids from your mouth and nose. °You had your spleen taken out. °You do not take good care of your teeth and mouth (poor dental hygiene). °You use or have used tobacco products. °You travel where the germs that cause this illness are common. °You are near certain animals or the places they live. °You are older than 72 years of age. °What are the signs or symptoms? °Symptoms of this condition include: °A cough. °A fever. °Sweating or chills. °Chest pain, often when you breathe deeply or cough. °Breathing problems, such as: °Fast breathing. °Trouble breathing. °Shortness of breath. °Feeling tired (fatigued). °Muscle aches. °How is this treated? °Treatment for this condition depends on many things, such as: °The cause of your illness. °Your medicines. °Your other health problems. °Most adults can be treated at home. Sometimes, treatment must happen in a hospital. °Treatment may include  medicines to kill germs. °Medicines may depend on which germ caused your illness. °Very bad pneumonia is rare. If you get it, you may: °Have a machine to help you breathe. °Have fluid taken away from around your lungs. °Follow these instructions at home: °Medicines °Take over-the-counter and prescription medicines only as told by your doctor. °Take cough medicine only if you are losing sleep. Cough medicine can keep your body from taking mucus away from your lungs. °If you were prescribed an antibiotic medicine, take it as told by your doctor. Do not stop taking the antibiotic even if you start to feel better. °Lifestyle °  °Do not drink alcohol. °Do not use any products that contain nicotine or tobacco, such as cigarettes, e-cigarettes, and chewing tobacco. If you need help quitting, ask your doctor. °Eat a healthy diet. This includes a lot of vegetables, fruits, whole grains, low-fat dairy products, and low-fat (lean) protein. °General instructions ° °Rest a lot. Sleep for at least 8 hours each night. °Sleep with your head and neck raised. Put a few pillows under your head or sleep in a reclining chair. °Return to your normal activities as told by your doctor. Ask your doctor what activities are safe for you. °Drink enough fluid to keep your pee (urine) pale yellow. °If your throat is sore, rinse your mouth often with salt water. To make salt water, dissolve ½-1 tsp (3-6 g) of salt in 1 cup (237 mL) of warm water. °Keep all follow-up visits as told by your doctor. This is important. °How is this prevented? °You can lower your risk of pneumonia by: °Getting the pneumonia shot (vaccine). These shots have different   types and schedules. Ask your doctor what works best for you. Think about getting this shot if: °You are older than 72 years of age. °You are 19-65 years of age and: °You are being treated for cancer. °You have long-term lung disease. °You have other problems that affect your body's defense system. Ask  your doctor if you have one of these. °Getting your flu shot every year. Ask your doctor which type of shot is best for you. °Going to the dentist as often as told. °Washing your hands often with soap and water for at least 20 seconds. If you cannot use soap and water, use hand sanitizer. °Contact a doctor if: °You have a fever. °You lose sleep because your cough medicine does not help. °Get help right away if: °You are short of breath and this gets worse. °You have more chest pain. °Your sickness gets worse. This is very serious if: °You are an older adult. °Your body's defense system is weak. °You cough up blood. °These symptoms may be an emergency. Do not wait to see if the symptoms will go away. Get medical help right away. Call your local emergency services (911 in the [place] Do not drive yourself to the hospital. °Summary °Pneumonia is an infection of the lungs. °Community-acquired pneumonia affects people who have not been in the hospital. Certain germs can cause this infection. °This condition may be treated with medicines that kill germs. °For very bad pneumonia, you may need a hospital stay and treatment to help with breathing. °This information is not intended to replace advice given to you by your health care provider. Make sure you discuss any questions you have with your health care provider. °Document Revised: 05/03/2019 Document Reviewed: 05/03/2019 °[employer] Patient Education © 2022 [employer] ° °

## 2021-10-21 NOTE — Progress Notes (Signed)
 Cardiology Office Note    Date:  10/24/2021   ID:  Jordan, Chan January 27, 1951, MRN [id number]  PCP:  [name], NP  Cardiologist:  Lonni Hanson, MD  Electrophysiologist:  None   Chief Complaint: Preoperative cardiac risk stratification  History of Present Illness:   Jordan Chan is a 71 y.o. female with history of nonobstructive CAD by LHC in 2018, stress-induced cardiomyopathy in 04/2017 in the setting of pain pump malfunction and opioid withdrawal, PE following hospitalization for PNA in the summer of 2021 treated with Eliquis , anemia/leukocytosis/thrombocytosis followed by hematology, chronic low back pain, DM, COPD, sleep apnea intolerant to CPAP on nocturnal oxygen , IBS, influenza A in 07/2021, and GERD who presents for cardiac risk stratification.     Echo in 04/2017 showed an EF of 25-30%, severe hypokinesis of the mid and apical myocardium suggestive of stress-induced cardiomyopathy, mild left atrial enlargement, and a poorly visualized RV that was grossly normal in size.  LHC at that time showed no angiographically significant CAD with a severely reduced LVEF estimated at 25 to 30% with mid and apical akinesis consistent with stress-induced cardiomyopathy.  Repeat echo in 05/2017, through [facility], showed an improving LV systolic function with an EF of 45 to 50%, global hypokinesis, normal LV size and wall thickness, grade 1 diastolic dysfunction, and normal RV systolic function and ventricular cavity size.  Limited echo in 08/2017 showed a low normal LV systolic function with an EF of 50 to 55%.  Echo in 07/2020 showed an EF of 45 to 50%, global hypokinesis, grade 2 diastolic dysfunction, normal RV systolic function and ventricular cavity size, no pericardial effusion, no significant valvular abnormalities, and a normal CVP.     She was seen in 12/2020 and reported feeling short of breath and fatigue all of the time since January.  D-dimer was elevated with  subsequent CTA chest in the ED being negative for PE.  Echo from 12/2020, performed in the context of feeling short of breath and fatigued showed an EF of 45% with global hypokinesis, grade 2 diastolic dysfunction, normal RV systolic function and ventricular cavity size, no pericardial effusion, and no significant valvular abnormalities.   She was seen in the office in 01/2021 and continued to feel about the same, possibly slightly better when compared to her visit 1 month prior.  She had been diagnosed with anemia and was started on folate as well as iron  infusions.  She continued to report shortness of breath with mild activity and with talking for extended time frames.  She had a chronic cough.  She had no chest pain or palpitations.  She noted minimal lower extremity edema.  At times, she reported feeling off balance as if she was walking sideways.  It was noted her lisinopril  had been decreased to 2.5 mg in the setting of low blood pressure.  Her cardiomyopathy was felt to not completely explain her symptoms of fatigue and shortness of breath.  She appeared euvolemic.  There was low suspicion for ischemic heart disease driving her symptoms given LHC as outlined above.   She was seen in the [facility] ED on 01/11/2021 with reports of diarrhea and ongoing shortness of breath.  High-sensitivity troponin was negative.  EKG showed sinus rhythm without acute ST-T changes.  CT of the abdomen/pelvis showed diverticulosis without diverticulitis.   She was seen in the office in 06/2021 noting stable longstanding fatigue, which she attributed to only sleeping a couple hours per night with noted  intolerance to CPAP.  Her dyspnea was stable.  Her weight was down 12 pounds when compared to her prior clinic visit.   She was seen in the ED in 07/2021 and diagnosed with influenza A.   She was admitted to the hospital in 09/2021 with a COPD exacerbation and pneumonia.  She tested positive for metapneumovirus.  She was treated  with inhalers, antibiotics, and steroids.   She was readmitted to [facility] for a 2nd time in 09/2021, with acute on chronic hypoxic respiratory failure and septic shock requiring vasopressor support with Levophed  in the setting of PNA and acute COPD exacerbation, HFrEF, and new onset Afib with RVR.  CTA chest showed no evidence of PE with multifocal pneumonia noted.  High-sensitivity troponin normal x4 during the admission.  BNP 494.  Echo demonstrated an EF of 25 to 30%, global hypokinesis, grade 2 diastolic dysfunction, moderately reduced RV systolic function, no significant valvular abnormalities, and an estimated right atrial pressure of 8 mmHg.  With regards to her A-fib, she was placed on an amiodarone  infusion and loaded with digoxin .  She subsequently converted to sinus rhythm without significant posttermination pause.  She was noted to have an elevated digoxin  level at 2.4, though did not require Digibind.  Relative hypotension, in the context of her septic shock, precluded escalation of GDMT.   She was seen in hospital follow on 09/16/2021 and was doing well from a cardiac perspective, without symptoms of angina or decompensation.  She had not yet started amiodarone .  She was tolerating OAC.  She was started on losartan .    She comes in doing well from a cardiac perspective.  She is without symptoms of angina or decompensation.  No lower extremity swelling, abdominal distention, or orthopnea.  No falls, hematochezia, melena, hemoptysis, or hematemesis.  She is tolerating apixaban  without issues.  Since discontinuing amiodarone  that she has noted resolution of oral ulcers.  She feels like her strength is returning following her hospital admission.  Her appetite has not picked back up to prior baseline.  She remains active.  She is scheduled for an EGD for dysphagia and a screening colonoscopy on 10/28/2021.     Labs independently reviewed: 09/2021 - BUN 18, serum creatinine 1.3, potassium 4.0, Hgb 11.9,  PLT 316, TSH normal, free T4 1.95, magnesium  1.7, albumin 4.4, AST/ALT normal 04/2021 - TC 141, TG 151, HDL 50, LDL 65  Past Medical History:  Diagnosis Date   Arthritis    Asthma    Atrial fibrillation (HCC)    Breast cancer (HCC) 1998   right breast ca with mastectomy and chemotherapy and radiation   Bronchitis    CHF (congestive heart failure) (HCC)    with Morphine  withdrawal   COPD (chronic obstructive pulmonary disease) (HCC)    Diabetes mellitus without complication (HCC)    Diverticulitis    diverticulosis also   Dyspnea    Endometriosis    GERD (gastroesophageal reflux disease)    History of shingles 2000-2005   Hypercholesteremia    Hypertension    IBS (irritable bowel syndrome)    Low back pain    a. Implanted morphine /bupivicaine/clonidine  pump.   Neuropathy    Orthopnea    Oxygen  dependent    uses at night   Personal history of chemotherapy    Personal history of radiation therapy    Pneumonia    pneumonia 5-6 times, history of bronchitis also   Scoliosis    Sleep apnea    does not use cpap  Stroke (HCC) 2010   TIA, 10 years ago   Withdrawal from sedative drug (HCC)    withdrawal from morphine  when pump batteries died    Past Surgical History:  Procedure Laterality Date   ABDOMINAL HYSTERECTOMY  1987   BACK SURGERY     Tailbone removed following fracture   BREAST SURGERY Right    mastectomy   CARDIAC CATHETERIZATION     CATARACT EXTRACTION W/PHACO Right 05/19/2019   Procedure: CATARACT EXTRACTION PHACO AND INTRAOCULAR LENS PLACEMENT (IOC), RIGHT, DIABETIC;  Surgeon: Ferol Rogue, MD;  Location: [facility] ORS;  Service: Ophthalmology;  Laterality: Right;  Lot # [id number] H US : 00:43.8 CDE: 4.59   CATARACT EXTRACTION W/PHACO Left 06/16/2019   Procedure: CATARACT EXTRACTION PHACO AND INTRAOCULAR LENS PLACEMENT (IOC) LEFT VISION BLUE DIABETIC;  Surgeon: Ferol Rogue, MD;  Location: [facility] ORS;  Service: Ophthalmology;  Laterality: Left;  Lot #[id number] H US :  00:46.9 CDE: 6.53   COCCYX REMOVAL     COLONOSCOPY WITH PROPOFOL  N/A 01/01/2017   Procedure: COLONOSCOPY WITH PROPOFOL ;  Surgeon: Therisa Bi, MD;  Location: [facility] ENDOSCOPY;  Service: Endoscopy;  Laterality: N/A;   ELBOW ARTHROSCOPY WITH TENDON RECONSTRUCTION     ESOPHAGOGASTRODUODENOSCOPY (EGD) WITH PROPOFOL  N/A 01/01/2017   Procedure: ESOPHAGOGASTRODUODENOSCOPY (EGD) WITH PROPOFOL ;  Surgeon: Therisa Bi, MD;  Location: [facility] ENDOSCOPY;  Service: Endoscopy;  Laterality: N/A;   INTRATHECAL PUMP IMPLANT     LEFT HEART CATH AND CORONARY ANGIOGRAPHY N/A 04/28/2017   Procedure: LEFT HEART CATH AND CORONARY ANGIOGRAPHY;  Surgeon: Mady Bruckner, MD;  Location: [facility] INVASIVE CV LAB;  Service: Cardiovascular;  Laterality: N/A;   MASTECTOMY Right 06/1997   morphine  pump  2011   PLANTAR FASCIA RELEASE     TRIGGER FINGER RELEASE      Current Medications: Current Meds  Medication Sig   ACCU-CHEK AVIVA PLUS test strip TEST THREE TIMES DAILY   Accu-Chek Softclix Lancets lancets TEST BLOOD SUGAR THREE TIMES DAILY   albuterol  (PROVENTIL  HFA;VENTOLIN  HFA) 108 (90 BASE) MCG/ACT inhaler Inhale 2 puffs into the lungs every 6 (six) hours as needed for wheezing or shortness of breath.    Alcohol  Swabs (B-D SINGLE USE SWABS REGULAR) PADS USE TWICE DAILY  WITH  SUGAR  CHECKS   apixaban  (ELIQUIS ) 5 MG TABS tablet Take 1 tablet (5 mg total) by mouth 2 (two) times daily.   atorvastatin  (LIPITOR) 20 MG tablet Take 1 tablet (20 mg total) by mouth daily.   Blood Glucose Monitoring Suppl (TRUE METRIX METER) w/Device KIT Use to check blood sugar 4 times a day   cholecalciferol  (VITAMIN D3) 25 MCG (1000 UNIT) tablet Take 1,000 Units by mouth daily.   Cyanocobalamin  1000 MCG/ML KIT Inject 1,000 mcg as directed every 30 (thirty) days.   cyclobenzaprine  (FLEXERIL ) 5 MG tablet Take 5 mg by mouth 2 (two) times daily as needed for muscle spasms. Takes very rarely   diclofenac  (VOLTAREN ) 50 MG EC tablet TAKE 1 TABLET THREE  TIMES DAILY   Dulaglutide  (TRULICITY ) 4.5 MG/0.5ML SOPN Inject 4.5 mg as directed once a week.   folic acid  (FOLVITE ) 1 MG tablet TAKE 1 TABLET EVERY DAY   furosemide  (LASIX ) 40 MG tablet TAKE 1 TABLET EVERY DAY   gabapentin  (NEURONTIN ) 800 MG tablet Take 1 tablet (800 mg total) by mouth 3 (three) times daily.   insulin  degludec (TRESIBA  FLEXTOUCH) 100 UNIT/ML FlexTouch Pen Inject 50 Units into the skin at bedtime.   losartan  (COZAAR ) 25 MG tablet Take 0.5 tablets (12.5 mg total) by mouth daily.  metFORMIN  (GLUCOPHAGE ) 1000 MG tablet Take 1 tablet (1,000 mg total) by mouth 2 (two) times daily with a meal.   metoprolol  succinate (TOPROL -XL) 25 MG 24 hr tablet TAKE 1/2 TABLET EVERY DAY   montelukast  (SINGULAIR ) 10 MG tablet Take 10 mg by mouth at bedtime.   ondansetron  (ZOFRAN ) 4 MG tablet Take 1 tablet (4 mg total) by mouth every 8 (eight) hours as needed for nausea or vomiting.   PAIN MANAGEMENT INTRATHECAL, IT, PUMP 1 each by Intrathecal route. Intrathecal (IT) medication:  Morphine  Patient does not remember current. Adjusted every 2.5 months at [facility] in [place].   pantoprazole  (PROTONIX ) 40 MG tablet Take 1 tablet (40 mg total) by mouth daily.   TRELEGY ELLIPTA  100-62.5-25 MCG/INH AEPB INHALE 1 PUFF INTO THE LUNGS DAILY.   venlafaxine  XR (EFFEXOR -XR) 150 MG 24 hr capsule Take 1 capsule (150 mg total) by mouth daily.    Allergies:   Other, Pain patch [menthol], Avelox  [moxifloxacin  hcl in nacl], Doxycycline , Erythromycin , Fentanyl , Moxifloxacin  hcl, Oxycontin [oxycodone], and Ozempic [semaglutide]   Social History   Socioeconomic History   Marital status: Unknown    Spouse name: Not on file   Number of children: Not on file   Years of education: Not on file   Highest education level: High school graduate  Occupational History   Occupation: retired  Tobacco Use   Smoking status: Former    Packs/day: 1.00    Years: 30.00    Pack years: 30.00    Types:  Cigarettes    Quit date: 04/30/2004    Years since quitting: 17.4   Smokeless tobacco: Never  Vaping Use   Vaping Use: Never used  Substance and Sexual Activity   Alcohol  use: No   Drug use: No   Sexual activity: Not Currently    Birth control/protection: Post-menopausal  Other Topics Concern   Not on file  Social History Narrative   ** Merged History Encounter **       Social Determinants of Health   Financial Resource Strain: Low Risk    Difficulty of Paying Living Expenses: Not hard at all  Food Insecurity: No Food Insecurity   Worried About Programme researcher, broadcasting/film/video in the Last Year: Never true   Ran Out of Food in the Last Year: Never true  Transportation Needs: No Transportation Needs   Lack of Transportation (Medical): No   Lack of Transportation (Non-Medical): No  Physical Activity: Inactive   Days of Exercise per Week: 0 days   Minutes of Exercise per Session: 0 min  Stress: No Stress Concern Present   Feeling of Stress : Not at all  Social Connections: Not on file     Family History:  The patient's family history includes Arthritis in her paternal grandfather; Cancer in her paternal grandmother and sister; Cancer (age of onset: 64) in her father; Cancer (age of onset: 15) in her mother; Hyperlipidemia in her sister and son; Hypertension in her sister; Osteoporosis in her maternal grandmother; Seizures in her son; Thyroid  disease in her mother. There is no history of Breast cancer.  ROS:   12-point review of system is negative as otherwise noted in the HPI.   EKGs/Labs/Other Studies Reviewed:    Studies reviewed were summarized above. The additional studies were reviewed today:  2D echo 09/11/2021: 1. Left ventricular ejection fraction, by estimation, is 25 to 30%. The  left ventricle has severely decreased function. The left ventricle  demonstrates global hypokinesis. Left ventricular diastolic  parameters are  consistent with Grade II diastolic  dysfunction  (pseudonormalization).   2. Right ventricular systolic function is moderately reduced. The right  ventricular size is not well visualized.   3. The mitral valve is normal in structure. No evidence of mitral valve  regurgitation.   4. The aortic valve is grossly normal. Aortic valve regurgitation is not  visualized.   5. The inferior vena cava is normal in size with <50% respiratory  variability, suggesting right atrial pressure of 8 mmHg. __________   2D echo 12/11/2020: 1. Left ventricular ejection fraction, by estimation, is 45%. The left  ventricle has mildly decreased function. The left ventricle demonstrates  global hypokinesis. Left ventricular diastolic parameters are consistent  with Grade II diastolic dysfunction  (pseudonormalization).   2. Right ventricular systolic function is normal. The right ventricular  size is normal.   3. The mitral valve is normal in structure. No evidence of mitral valve  regurgitation.   4. The aortic valve is tricuspid. Aortic valve regurgitation is not  visualized.   Comparison(s): EF 45-50%. __________   LHC 04/28/2017: Conclusions: No angiographically significant coronary artery disease. Severely reduced LV contraction (LVEF 25-30%) with mid and apical akinesis, consistent with Takotsubo (stress-induced) cardiomyopathy. Mildly elevated left ventricular filling pressure.   Recommendations: Medical therapy, including initiation of metoprolol  succinate 12.5 mg daily and furosemide  20 mg IV daily. If blood pressure tolerates, consider addition of low-dose ACE inhibitor tomorrow. Continue treatment of pain and opioid withdrawal.   EKG:  EKG is ordered today.  The EKG ordered today demonstrates NSR, 80 bpm, no acute ST-T changes  Recent Labs: 09/11/2021: B Natriuretic Peptide 494.2 09/17/2021: ALT 21; Magnesium  1.7 09/26/2021: TSH 3.740 10/23/2021: BUN 18; Creatinine, Ser 1.08; Hemoglobin 12.6; Platelets 420; Potassium 4.7; Sodium 140  Recent  Lipid Panel    Component Value Date/Time   CHOL 141 04/29/2021 0944   CHOL 127 07/26/2019 0817   TRIG 151 (H) 04/29/2021 0944   TRIG 104 07/26/2019 0817   HDL 50 04/29/2021 0944   VLDL 21 07/26/2019 0817   LDLCALC 65 04/29/2021 0944    PHYSICAL EXAM:    VS:  BP 110/68 (BP Location: Left Arm, Patient Position: Sitting, Cuff Size: Normal)   Pulse 80   Ht 4' 10.5 (1.486 m)   Wt 146 lb (66.2 kg)   LMP  (LMP Unknown)   SpO2 98%   BMI 29.99 kg/m   BMI: Body mass index is 29.99 kg/m.  Physical Exam Vitals reviewed.  Constitutional:      Appearance: She is well-developed.  HENT:     Head: Normocephalic and atraumatic.  Eyes:     General:        Right eye: No discharge.        Left eye: No discharge.  Neck:     Vascular: No JVD.  Cardiovascular:     Rate and Rhythm: Normal rate and regular rhythm.     Pulses:          Posterior tibial pulses are 2+ on the right side and 2+ on the left side.     Heart sounds: Normal heart sounds, S1 normal and S2 normal. Heart sounds not distant. No midsystolic click and no opening snap. No murmur heard.   No friction rub.  Pulmonary:     Effort: Pulmonary effort is normal. No respiratory distress.     Breath sounds: Normal breath sounds. No decreased breath sounds, wheezing or rales.  Chest:  Chest wall: No tenderness.  Abdominal:     General: There is no distension.     Palpations: Abdomen is soft.     Tenderness: There is no abdominal tenderness.  Musculoskeletal:     Cervical back: Normal range of motion.     Right lower leg: No edema.     Left lower leg: No edema.  Skin:    General: Skin is warm and dry.     Nails: There is no clubbing.  Neurological:     Mental Status: She is alert and oriented to person, place, and time.  Psychiatric:        Speech: Speech normal.        Behavior: Behavior normal.        Thought Content: Thought content normal.        Judgment: Judgment normal.    Wt Readings from Last 3 Encounters:   10/24/21 146 lb (66.2 kg)  10/23/21 145 lb 12.8 oz (66.1 kg)  09/26/21 148 lb (67.1 kg)     ASSESSMENT & PLAN:   Preoperative cardiac risk stratification: She is currently scheduled for an EGD secondary to dysphagia as well as a screening colonoscopy on 10/28/2021.  After discussion with the patient's primary cardiologist, we recommend postponing these 2 endoscopic procedures until we have further evaluated the patient's cardiomyopathy.  We will be obtaining a limited echo, if this demonstrates normalization of her LV systolic function, she may proceed with noncardiac procedure at an overall moderate risk.  However, if her cardiomyopathy persists, we will need to pursue ischemic evaluation prior to proceeding with noncardiac procedure.  At baseline, she is able to achieve greater than 4 METs without cardiac limitation.  Persistent A-fib: Maintaining sinus rhythm.  Continue Toprol -XL.  No longer on amiodarone  secondary to oral ulcers which have resolved following discontinuation of medication.  Given a CHA2DS2-VASc of 5 she remains on apixaban  without symptoms concerning for bleeding.  Primary cardiologist has referred the patient to EP for ongoing management of her A-fib.  HFrEF secondary to presumed stress-induced versus tachycardia mediated cardiomyopathy: She appears euvolemic and well compensated.  Her recurrent cardiomyopathy has been presumed to be stress-induced versus tachycardia mediated in etiology.  LHC in 2018 was without evidence of CAD.  Relative hypotension has precluded escalation of GDMT.  She remains on Toprol -XL, losartan , and furosemide .  Update limited echo to evaluate for improvement in LV systolic function on maximally tolerated GDMT.  If her cardiomyopathy persists at that time, would recommend further ischemic evaluation.  Recent labs stable.    Disposition: F/u with Dr. Mady or an APP in 6 months.   Medication Adjustments/Labs and Tests Ordered: Current medicines are  reviewed at length with the patient today.  Concerns regarding medicines are outlined above. Medication changes, Labs and Tests ordered today are summarized above and listed in the Patient Instructions accessible in Encounters.   Signed, Bernardino Bring, PA-C 10/24/2021 10:16 AM     [facility] [place] [address] [place], [place] [address] [phone]

## 2021-10-23 ENCOUNTER — Ambulatory Visit (INDEPENDENT_AMBULATORY_CARE_PROVIDER_SITE_OTHER): Payer: Medicare HMO | Admitting: Nurse Practitioner

## 2021-10-23 ENCOUNTER — Telehealth: Payer: Self-pay

## 2021-10-23 ENCOUNTER — Other Ambulatory Visit: Payer: Self-pay

## 2021-10-23 ENCOUNTER — Encounter: Payer: Self-pay | Admitting: Nurse Practitioner

## 2021-10-23 ENCOUNTER — Ambulatory Visit: Payer: Medicare HMO | Admitting: Nurse Practitioner

## 2021-10-23 VITALS — BP 93/58 | HR 77 | Temp 98.6°F | Ht 58.5 in | Wt 145.8 lb

## 2021-10-23 DIAGNOSIS — N1831 Chronic kidney disease, stage 3a: Secondary | ICD-10-CM

## 2021-10-23 DIAGNOSIS — E538 Deficiency of other specified B group vitamins: Secondary | ICD-10-CM | POA: Diagnosis not present

## 2021-10-23 DIAGNOSIS — A419 Sepsis, unspecified organism: Secondary | ICD-10-CM

## 2021-10-23 DIAGNOSIS — J189 Pneumonia, unspecified organism: Secondary | ICD-10-CM

## 2021-10-23 DIAGNOSIS — I4891 Unspecified atrial fibrillation: Secondary | ICD-10-CM

## 2021-10-23 MED ORDER — CYANOCOBALAMIN 1000 MCG/ML IJ SOLN
1000.0000 ug | Freq: Once | INTRAMUSCULAR | Status: AC
  Administered 2021-10-23: 1000 ug via INTRAMUSCULAR

## 2021-10-23 NOTE — Assessment & Plan Note (Signed)
Diagnosed on 09/11/21 with underlying PNA.  At this time continue current medication regimen as ordered by cardiology and collaboration with cardiology.  Good rate control today.  Has follow-up tomorrow with cardiology. ?

## 2021-10-23 NOTE — Addendum Note (Signed)
Addended by: Irena Reichmann on: 10/23/2021 11:19 AM ? ? Modules accepted: Orders ? ?

## 2021-10-23 NOTE — Progress Notes (Signed)
? ?BP (!) 93/58   Pulse 77   Temp 98.6 ?F (37 ?C) (Oral)   Ht 4' 10.5" (1.486 m)   Wt 145 lb 12.8 oz (66.1 kg)   LMP  (LMP Unknown)   SpO2 93%   BMI 29.95 kg/m?   ? ?Subjective:  ? ? [name], female    DOB: [birth year], 71 y.o.   MRN: [id number] ? ?HPI: ?Jordan Chan is a 71 y.o. female ? ?Chief Complaint  ?Patient presents with  ? Atrial Fibrillation  ? Pneumonia  ? ?ATRIAL FIBRILLATION & PNEUMONIA ?At this time feels better then she has in a long time -- some fatigue decreased appetite remains, but overall feels better.  Recent visit with GI on 09/23/21 -- plan is to perform EGD and colonoscopy on 10/28/21.  She does endorse food feels like it is lying right in her throat when she eats.   ?  ?Recent visit with cardiology on 09/16/21 -- she was on Amiodarone and this caused multiple side effects and was discontinued.  She continues on Metoprolol and Eliquis.   ?Atrial fibrillation status: stable ?Satisfied with current treatment: yes  ?Medication side effects:  no ?Medication compliance: good compliance ?Etiology of atrial fibrillation: unknown ?Palpitations: none ?Chest pain: none ?Dyspnea on exertion: occasional per baseline -- followed by pulmonary ?Orthopnea:  no ?Syncope:  no ?Edema:  no ?Ventricular rate control: Metoprolol -- did not tolerate Amiodarone ?Anti-coagulation: long acting  ? ?Relevant past medical, surgical, family and social history reviewed and updated as indicated. Interim medical history since our last visit reviewed. ?Allergies and medications reviewed and updated. ? ?Review of Systems  ?Constitutional:  Negative for activity change, appetite change, diaphoresis, fatigue and fever.  ?Respiratory:  Positive for shortness of breath (at baseline). Negative for cough, chest tightness and wheezing.   ?Cardiovascular:  Negative for chest pain, palpitations and leg swelling.  ?Gastrointestinal: Negative.   ?Endocrine: Negative for heat intolerance, polydipsia,  polyphagia and polyuria.  ?Neurological: Negative.   ?Psychiatric/Behavioral: Negative.  Negative for decreased concentration, self-injury, sleep disturbance and suicidal ideas. The patient is not nervous/anxious.   ? ?Per HPI unless specifically indicated above ? ?   ?Objective:  ?  ?BP (!) 93/58   Pulse 77   Temp 98.6 ?F (37 ?C) (Oral)   Ht 4' 10.5" (1.486 m)   Wt 145 lb 12.8 oz (66.1 kg)   LMP  (LMP Unknown)   SpO2 93%   BMI 29.95 kg/m?   ?Wt Readings from Last 3 Encounters:  ?10/23/21 145 lb 12.8 oz (66.1 kg)  ?09/26/21 148 lb (67.1 kg)  ?09/23/21 148 lb 6 oz (67.3 kg)  ?  ?Physical Exam ?Vitals and nursing note reviewed.  ?Constitutional:   ?   General: She is awake. She is not in acute distress. ?   Appearance: She is well-developed and well-groomed. She is not ill-appearing.  ?HENT:  ?   Head: Normocephalic.  ?   Right Ear: Hearing normal.  ?   Left Ear: Hearing normal.  ?   Mouth/Throat:  ?   Lips: Pink.  ?   Mouth: Mucous membranes are moist.  ?Eyes:  ?   General: Lids are normal.     ?   Right eye: No discharge.     ?   Left eye: No discharge.  ?   Conjunctiva/sclera: Conjunctivae normal.  ?   Pupils: Pupils are equal, round, and reactive to light.  ?Neck:  ?   Thyroid: No  thyromegaly.  ?   Vascular: No carotid bruit.  ?Cardiovascular:  ?   Rate and Rhythm: Normal rate and regular rhythm.  ?   Heart sounds: Normal heart sounds. No murmur heard. ?  No gallop.  ?Pulmonary:  ?   Effort: Pulmonary effort is normal. No accessory muscle usage or respiratory distress.  ?   Breath sounds: No wheezing or rhonchi.  ?   Comments: Clear throughout on exam today, improved from previous. ?Abdominal:  ?   General: Bowel sounds are normal.  ?   Palpations: Abdomen is soft.  ?Musculoskeletal:  ?   Cervical back: Normal range of motion and neck supple.  ?   Right lower leg: No edema.  ?   Left lower leg: No edema.  ?Lymphadenopathy:  ?   Cervical: No cervical adenopathy.  ?Skin: ?   General: Skin is warm and dry.   ?Neurological:  ?   Mental Status: She is alert and oriented to person, place, and time.  ?Psychiatric:     ?   Attention and Perception: Attention normal.     ?   Mood and Affect: Mood normal.     ?   Speech: Speech normal.     ?   Behavior: Behavior normal. Behavior is cooperative.     ?   Thought Content: Thought content normal.  ? ? ?Results for orders placed or performed in visit on 09/26/21  ?T4, free  ?Result Value Ref Range  ? Free T4 1.95 (H) 0.82 - 1.77 ng/dL  ?TSH  ?Result Value Ref Range  ? TSH 3.740 0.450 - 4.500 uIU/mL  ?CBC with Differential/Platelet  ?Result Value Ref Range  ? WBC 12.5 (H) 3.4 - 10.8 x10E3/uL  ? RBC 3.94 3.77 - 5.28 x10E6/uL  ? Hemoglobin 11.9 11.1 - 15.9 g/dL  ? Hematocrit 36.9 34.0 - 46.6 %  ? MCV 94 79 - 97 fL  ? MCH 30.2 26.6 - 33.0 pg  ? MCHC 32.2 31.5 - 35.7 g/dL  ? RDW 13.8 11.7 - 15.4 %  ? Platelets 316 150 - 450 x10E3/uL  ? Neutrophils 69 Not Estab. %  ? Lymphs 21 Not Estab. %  ? Monocytes 7 Not Estab. %  ? Eos 2 Not Estab. %  ? Basos 1 Not Estab. %  ? Neutrophils Absolute 8.6 (H) 1.4 - 7.0 x10E3/uL  ? Lymphocytes Absolute 2.6 0.7 - 3.1 x10E3/uL  ? Monocytes Absolute 0.8 0.1 - 0.9 x10E3/uL  ? EOS (ABSOLUTE) 0.3 0.0 - 0.4 x10E3/uL  ? Basophils Absolute 0.1 0.0 - 0.2 x10E3/uL  ? Immature Granulocytes 0 Not Estab. %  ? Immature Grans (Abs) 0.0 0.0 - 0.1 x10E3/uL  ?Basic metabolic panel  ?Result Value Ref Range  ? Glucose 124 (H) 70 - 99 mg/dL  ? BUN 18 8 - 27 mg/dL  ? Creatinine, Ser 1.30 (H) 0.57 - 1.00 mg/dL  ? eGFR 44 (L) >59 mL/min/1.73  ? BUN/Creatinine Ratio 14 12 - 28  ? Sodium 140 134 - 144 mmol/L  ? Potassium 4.0 3.5 - 5.2 mmol/L  ? Chloride 93 (L) 96 - 106 mmol/L  ? CO2 26 20 - 29 mmol/L  ? Calcium 8.7 8.7 - 10.3 mg/dL  ? ?   ?Assessment & Plan:  ? ?Problem List Items Addressed This Visit   ? ?  ? Cardiovascular and Mediastinum  ? Atrial fibrillation with RVR ([place]) - Primary  ?  Diagnosed on 09/11/21 with underlying PNA.  At this time continue current  medication  regimen as ordered by cardiology and collaboration with cardiology.  Good rate control today.  Has follow-up tomorrow with cardiology. ?  ?  ?  ? Respiratory  ? Sepsis due to pneumonia [facility])  ?  Acute and improved at this time, patient reports feeling much better.  Continue current treatment regimen at home.  Recent imaging showed improved PNA.  Lab recheck today as CBC continues to show elevation + check BMP. ?  ?  ? Relevant Orders  ? Basic metabolic panel  ? CBC with Differential/Platelet  ?  ? Genitourinary  ? Chronic kidney disease, stage III (moderate) (HCC)  ?  Noted on hospital labs, recheck CMP today. ?  ?  ? Relevant Orders  ? Basic metabolic panel  ?  ? ?Follow up plan: ?Return in about 7 weeks (around 12/11/2021) for T2DM, HTN/HLD, COPD, MOOD, ANEMIA. ? ? ? ? ? ?

## 2021-10-23 NOTE — Chronic Care Management (AMB) (Signed)
? ? ?Chronic Care Management ?Pharmacy Assistant  ? ?Name: Jordan Chan  MRN: [id number] DOB: May 18, 1951 ? ?Reason for Encounter: Disease State General ? ? ?Recent office visits:  ?10/23/21-[name], NP (PCP) Seen for atrial fibrillation and pneumonia. Labs ordered. Follow up in 7 weeks. ?09/26/21-[name], NP (PCP) Seen for oral pain, pneumonia and atrial fibrillation. Labs ordered. Follow up in 2 weeks. ?09/17/21-[name], NP (PCP) Seen for pneumonia. Labs ordered. Ambulatory referral to Gastroenterology. Follow up in 1 week. ?08/14/21-[name], NP (PCP) Seen for pneumonia and leg pain. Chest xray ordered. Follow up in 4 weeks. ?07/31/21-[name], NP (PCP) Seen for general follow up visit. Labs ordered. Chest xray completed. Start on amoxicillin-clavulanate (AUGMENTIN) 875-125 MG tablet and azithromycin (ZITHROMAX) 250 MG tablet. Follow up in 2 weeks. ?06/26/21-[name], NP (PCP) Seen for urinary tract infection. Start on AUGMENTIN) 875-125 MG tablet. ?04/29/21-Jolene  T. Ned Card, NP (PCP) General follow up visit. Labs ordered. Follow up in 3 months. ? ?Recent consult visits:  ?09/23/21-[name], MD (Gastroenterology) Initial visit. Return if symptoms worsen or fail to improve. ?09/16/21-[name] (Cardiology) Seen for hospital follow up visit. START Losartan 12.5 mg once daily. Follow up in 1 month. ?08/13/21-[name], NP (Oncology) Seen for leukocytosis. Follow up in 6 months. ?07/17/21-[name] (Pain medicine) Notes not available. ?07/10/21-[name], MD (Pulmonology) Follow up visit. Follow up in 4 months. ?07/03/21-[name], PA-C (Cardiology) Follow up visit. RESTART lisinopril 2.5 mg by mouth nightly. Follow up in 3 months. ?05/15/21-[name], MD  (Pain medicine) Seen for back pain, neck pain and leg pain. ? ?Hospital visits:  ?Medication Reconciliation was completed by comparing  discharge summary, patient?s EMR and Pharmacy list, and upon discussion with patient. ? ?Admitted to the hospital on 09/11/21 due to shortness of breath. Discharge date was 09/14/21. Discharged from [facility].   ? ?New?Medications Started at [facility] Discharge:?? ?-started None noted ? ?Medication Changes at Hospital Discharge: ?-Changed None noted ? ?Medications Discontinued at Hospital Discharge: ?-Stopped None noted ? ?Medications that remain the same after Hospital Discharge:??  ?-All other medications will remain the same.   ? ? ?Medication Reconciliation was completed by comparing discharge summary, patient?s EMR and Pharmacy list, and upon discussion with patient. ? ?Admitted to the hospital on 09/05/21 due to shortness of breath. Discharge date was 09/08/21 . Discharged from [facility].   ? ?New?Medications Started at [facility] Discharge:?? ?-started none noted ? ?Medication Changes at Hospital Discharge: ?-Changed none noted ? ?Medications Discontinued at Hospital Discharge: ?-Stopped none noted ? ?Medications that remain the same after Hospital Discharge:??  ?-All other medications will remain the same.   ? ? ?Medication Reconciliation was completed by comparing discharge summary, patient?s EMR and Pharmacy list, and upon discussion with patient. ? ?Admitted to the hospital on 07/18/21- due tocough and diarrhea. Discharge date was 07/18/21 . Discharged from [facility].   ? ?New?Medications Started at [facility] Discharge:?? ?-started Benzonatate 100 mg capsule ? ?Medication Changes at Hospital Discharge: ?-Changed none noted ? ?Medications Discontinued at Hospital Discharge: ?-Stopped none noted ? ?Medications that remain the same after Hospital Discharge:??  ?-All other medications will remain the same.   ? ?Medications: ?Outpatient Encounter Medications as of 10/23/2021  ?Medication Sig  ? ACCU-CHEK AVIVA PLUS test strip TEST THREE TIMES DAILY  ?  Accu-Chek Softclix Lancets lancets TEST BLOOD SUGAR THREE TIMES DAILY  ? albuterol (PROVENTIL HFA;VENTOLIN  HFA) 108 (90 BASE) MCG/ACT inhaler Inhale 2 puffs into the lungs every 6 (six) hours as needed for wheezing or shortness of breath.   ? Alcohol Swabs (B-D SINGLE USE SWABS REGULAR) PADS USE TWICE DAILY  WITH  SUGAR  CHECKS  ? apixaban (ELIQUIS) 5 MG TABS tablet Take 1 tablet (5 mg total) by mouth 2 (two) times daily.  ? atorvastatin (LIPITOR) 20 MG tablet Take 1 tablet (20 mg total) by mouth daily.  ? Blood Glucose Monitoring Suppl (TRUE METRIX METER) w/Device KIT Use to check blood sugar 4 times a day  ? cholecalciferol (VITAMIN D3) 25 MCG (1000 UNIT) tablet Take 1,000 Units by mouth daily.  ? Cyanocobalamin 1000 MCG/ML KIT Inject 1,000 mcg as directed every 30 (thirty) days.  ? cyclobenzaprine (FLEXERIL) 5 MG tablet Take 5 mg by mouth 2 (two) times daily as needed for muscle spasms. Takes very rarely  ? diclofenac (VOLTAREN) 50 MG EC tablet TAKE 1 TABLET THREE TIMES DAILY  ? Dulaglutide (TRULICITY) 4.5 JJ/8.8CZ SOPN Inject 4.5 mg as directed once a week.  ? folic acid (FOLVITE) 1 MG tablet TAKE 1 TABLET EVERY DAY  ? furosemide (LASIX) 40 MG tablet TAKE 1 TABLET EVERY DAY  ? gabapentin (NEURONTIN) 800 MG tablet Take 1 tablet (800 mg total) by mouth 3 (three) times daily.  ? insulin degludec (TRESIBA FLEXTOUCH) 100 UNIT/ML FlexTouch Pen Inject 50 Units into the skin at bedtime.  ? losartan (COZAAR) 25 MG tablet Take 0.5 tablets (12.5 mg total) by mouth daily.  ? metFORMIN (GLUCOPHAGE) 1000 MG tablet Take 1 tablet (1,000 mg total) by mouth 2 (two) times daily with a meal.  ? metoprolol succinate (TOPROL-XL) 25 MG 24 hr tablet TAKE 1/2 TABLET EVERY DAY  ? montelukast (SINGULAIR) 10 MG tablet Take 10 mg by mouth at bedtime.  ? ondansetron (ZOFRAN) 4 MG tablet Take 1 tablet (4 mg total) by mouth every 8 (eight) hours as needed for nausea or vomiting.  ? PAIN MANAGEMENT INTRATHECAL, IT, PUMP 1 each by Intrathecal  route. Intrathecal (IT) medication:  Morphine ?Patient does not remember current. Adjusted every 2.5 months at [facility] in [place].  ? pantoprazole (PROTONIX) 40 MG tablet Take 1 tablet (40 mg total) by mouth daily.  ? TRELEGY ELLIPTA 100-62.5-25 MCG/INH AEPB INHALE 1 PUFF INTO THE LUNGS DAILY.  ? venlafaxine XR (EFFEXOR-XR) 150 MG 24 hr capsule Take 1 capsule (150 mg total) by mouth daily.  ? ?No facility-administered encounter medications on file as of 10/23/2021.  ? ?I have attempted to complete assessment call. I have called 3x and left 3 voicemail's for patient to return phone call when available.  ? ? ?Care Gaps: ?None noted ? ?Star Rating Drugs: ?Losartan 25 mg Last filled:09/16/21 90 DS ?Trulicity 4.5 mg Last YSAYTK:16/01/09 84 DS ?Atorvastatin 20 mg Last filled:09/02/20 90 DS ?Metformin 1000 mg Last filled:09/22/21 90 DS ? ?Corrie Mckusick, RMA ?Health Concierge ? ?

## 2021-10-23 NOTE — Assessment & Plan Note (Signed)
Noted on hospital labs, recheck CMP today. ?

## 2021-10-23 NOTE — Assessment & Plan Note (Signed)
Acute and improved at this time, patient reports feeling much better.  Continue current treatment regimen at home.  Recent imaging showed improved PNA.  Lab recheck today as CBC continues to show elevation + check BMP. ?

## 2021-10-24 ENCOUNTER — Ambulatory Visit (INDEPENDENT_AMBULATORY_CARE_PROVIDER_SITE_OTHER): Payer: Medicare HMO | Admitting: Physician Assistant

## 2021-10-24 ENCOUNTER — Encounter: Payer: Self-pay | Admitting: Physician Assistant

## 2021-10-24 VITALS — BP 110/68 | HR 80 | Ht 58.5 in | Wt 146.0 lb

## 2021-10-24 DIAGNOSIS — I428 Other cardiomyopathies: Secondary | ICD-10-CM

## 2021-10-24 DIAGNOSIS — Z01810 Encounter for preprocedural cardiovascular examination: Secondary | ICD-10-CM | POA: Diagnosis not present

## 2021-10-24 DIAGNOSIS — I5022 Chronic systolic (congestive) heart failure: Secondary | ICD-10-CM | POA: Diagnosis not present

## 2021-10-24 DIAGNOSIS — I4819 Other persistent atrial fibrillation: Secondary | ICD-10-CM

## 2021-10-24 LAB — BASIC METABOLIC PANEL
BUN/Creatinine Ratio: 17 (ref 12–28)
BUN: 18 mg/dL (ref 8–27)
CO2: 25 mmol/L (ref 20–29)
Calcium: 9.1 mg/dL (ref 8.7–10.3)
Chloride: 97 mmol/L (ref 96–106)
Creatinine, Ser: 1.08 mg/dL — ABNORMAL HIGH (ref 0.57–1.00)
Glucose: 130 mg/dL — ABNORMAL HIGH (ref 70–99)
Potassium: 4.7 mmol/L (ref 3.5–5.2)
Sodium: 140 mmol/L (ref 134–144)
eGFR: 55 mL/min/{1.73_m2} — ABNORMAL LOW (ref >59)

## 2021-10-24 LAB — CBC WITH DIFFERENTIAL/PLATELET
Basophils Absolute: 0.1 10*3/uL (ref 0.0–0.2)
Basos: 1 %
EOS (ABSOLUTE): 0.3 10*3/uL (ref 0.0–0.4)
Eos: 3 %
Hematocrit: 37.5 % (ref 34.0–46.6)
Hemoglobin: 12.6 g/dL (ref 11.1–15.9)
Immature Grans (Abs): 0 10*3/uL (ref 0.0–0.1)
Immature Granulocytes: 0 %
Lymphocytes Absolute: 3.2 10*3/uL — ABNORMAL HIGH (ref 0.7–3.1)
Lymphs: 33 %
MCH: 31 pg (ref 26.6–33.0)
MCHC: 33.6 g/dL (ref 31.5–35.7)
MCV: 92 fL (ref 79–97)
Monocytes Absolute: 0.7 10*3/uL (ref 0.1–0.9)
Monocytes: 7 %
Neutrophils Absolute: 5.2 10*3/uL (ref 1.4–7.0)
Neutrophils: 56 %
Platelets: 420 10*3/uL (ref 150–450)
RBC: 4.06 x10E6/uL (ref 3.77–5.28)
RDW: 14.2 % (ref 11.7–15.4)
WBC: 9.5 10*3/uL (ref 3.4–10.8)

## 2021-10-24 NOTE — Progress Notes (Signed)
Good morning crew, please let Jordan Chan know her labs have returned and CBC now shows normal white blood cell count.  Much better!!  Kidney function still shows some mild kidney disease which we will continue to monitor closely.  Overall much better labs.  Any questions? ?Keep being stellar!!  Thank you for allowing me to participate in your care.  I appreciate you. ?Kindest regards, ?Stephanie Littman ?

## 2021-10-24 NOTE — Patient Instructions (Signed)
Call your GI provider at [phone] to cancel your EGD & Colonoscopy.  ? ?Medication Instructions:  ?No changes at this time. ? ?*If you need a refill on your cardiac medications before your next appointment, please call your pharmacy* ? ? ?Lab Work: ?None ? ?If you have labs (blood work) drawn today and your tests are completely normal, you will receive your results only by: ?MyChart Message (if you have MyChart) OR ?A paper copy in the mail ?If you have any lab test that is abnormal or we need to change your treatment, we will call you to review the results. ? ? ?Testing/Procedures: ?Your physician has requested that you have a limited echocardiogram. Echocardiography is a painless test that uses sound waves to create images of your heart. It provides your doctor with information about the size and shape of your heart and how well your heart?s chambers and valves are working. This procedure takes approximately one hour. There are no restrictions for this procedure. ? ? ? ?Follow-Up: ?At [facility], you and your health needs are our priority.  As part of our continuing mission to provide you with exceptional heart care, we have created designated Provider Care Teams.  These Care Teams include your primary Cardiologist (physician) and Advanced Practice Providers (APPs -  Physician Assistants and Nurse Practitioners) who all work together to provide you with the care you need, when you need it. ? ?We recommend signing up for the patient portal called "MyChart".  Sign up information is provided on this After Visit Summary.  MyChart is used to connect with patients for Virtual Visits (Telemedicine).  Patients are able to view lab/test results, encounter notes, upcoming appointments, etc.  Non-urgent messages can be sent to your provider as well.   ?To learn more about what you can do with MyChart, go to [email or web address].   ? ?Your next appointment:   ?6 month(s) ? ?The format for your next appointment:    ?In Person ? ?Provider:   ?[name], MD or [name], PA-C ? ?

## 2021-10-25 ENCOUNTER — Telehealth: Payer: Self-pay | Admitting: Gastroenterology

## 2021-10-25 NOTE — Telephone Encounter (Signed)
Called and cancel procedure with Trish with ENDO  ?

## 2021-10-25 NOTE — Telephone Encounter (Signed)
PT left message to cancel colonoscpy because her cardiologist advised her to put it off until he does further evaluation. ?

## 2021-10-28 ENCOUNTER — Ambulatory Visit: Admission: RE | Admit: 2021-10-28 | Payer: Medicare HMO | Source: Home / Self Care | Admitting: Gastroenterology

## 2021-10-28 ENCOUNTER — Encounter: Admission: RE | Payer: Self-pay | Source: Home / Self Care

## 2021-10-28 SURGERY — COLONOSCOPY WITH PROPOFOL
Anesthesia: General

## 2021-10-31 ENCOUNTER — Ambulatory Visit (INDEPENDENT_AMBULATORY_CARE_PROVIDER_SITE_OTHER): Payer: Medicare HMO

## 2021-10-31 DIAGNOSIS — I428 Other cardiomyopathies: Secondary | ICD-10-CM

## 2021-10-31 LAB — ECHOCARDIOGRAM LIMITED
Area-P 1/2: 3.08 cm2
Calc EF: 38.9 %
S' Lateral: 4.7 cm
Single Plane A2C EF: 38.4 %
Single Plane A4C EF: 37.3 %

## 2021-10-31 MED ORDER — PERFLUTREN LIPID MICROSPHERE
1.0000 mL | INTRAVENOUS | Status: AC | PRN
  Administered 2021-10-31: 2 mL via INTRAVENOUS

## 2021-11-01 ENCOUNTER — Telehealth: Payer: Self-pay | Admitting: *Deleted

## 2021-11-01 DIAGNOSIS — I428 Other cardiomyopathies: Secondary | ICD-10-CM

## 2021-11-01 NOTE — Telephone Encounter (Signed)
Left voicemail message to call back for review of results.  

## 2021-11-01 NOTE — Telephone Encounter (Signed)
-----   Message from Rise Mu, PA-C sent at 11/01/2021  7:13 AM EDT ----- ?Echo showed slightly reduced pump function at 40 to 45%, normal wall motion, slightly stiffened heart, no significant valvular abnormalities, and normal pressure within the upper right-hand side of the heart.  When compared to echo performed last month, pump function has improved from 25 to 30%.  Relative hypotension precludes further escalation of medical therapy.  With persistent cardiomyopathy noted, please schedule patient for Lexiscan MPI.  Follow-up thereafter. ?

## 2021-11-03 DIAGNOSIS — J45909 Unspecified asthma, uncomplicated: Secondary | ICD-10-CM | POA: Diagnosis not present

## 2021-11-03 DIAGNOSIS — J449 Chronic obstructive pulmonary disease, unspecified: Secondary | ICD-10-CM | POA: Diagnosis not present

## 2021-11-06 ENCOUNTER — Ambulatory Visit (INDEPENDENT_AMBULATORY_CARE_PROVIDER_SITE_OTHER): Payer: Medicare HMO | Admitting: Cardiology

## 2021-11-06 ENCOUNTER — Encounter: Payer: Self-pay | Admitting: Cardiology

## 2021-11-06 VITALS — BP 108/60 | HR 77 | Ht 58.5 in | Wt 143.0 lb

## 2021-11-06 DIAGNOSIS — I480 Paroxysmal atrial fibrillation: Secondary | ICD-10-CM

## 2021-11-06 DIAGNOSIS — Z86711 Personal history of pulmonary embolism: Secondary | ICD-10-CM | POA: Diagnosis not present

## 2021-11-06 DIAGNOSIS — I5022 Chronic systolic (congestive) heart failure: Secondary | ICD-10-CM | POA: Diagnosis not present

## 2021-11-06 DIAGNOSIS — J449 Chronic obstructive pulmonary disease, unspecified: Secondary | ICD-10-CM | POA: Diagnosis not present

## 2021-11-06 NOTE — Patient Instructions (Signed)
Medications: ?Your physician recommends that you continue on your current medications as directed. Please refer to the Current Medication list given to you today. ?*If you need a refill on your cardiac medications before your next appointment, please call your pharmacy* ? ?Lab Work: ?None. ?If you have labs (blood work) drawn today and your tests are completely normal, you will receive your results only by: ?MyChart Message (if you have MyChart) OR ?A paper copy in the mail ?If you have any lab test that is abnormal or we need to change your treatment, we will call you to review the results. ? ?Testing/Procedures: ?None. ? ?Follow-Up: ?At [facility], you and your health needs are our priority.  As part of our continuing mission to provide you with exceptional heart care, we have created designated Provider Care Teams.  These Care Teams include your primary Cardiologist (physician) and Advanced Practice Providers (APPs -  Physician Assistants and Nurse Practitioners) who all work together to provide you with the care you need, when you need it. ? ?Your physician wants you to follow-up in: As needed with Lars Mage  ? ?We recommend signing up for the patient portal called "MyChart".  Sign up information is provided on this After Visit Summary.  MyChart is used to connect with patients for Virtual Visits (Telemedicine).  Patients are able to view lab/test results, encounter notes, upcoming appointments, etc.  Non-urgent messages can be sent to your provider as well.   ?To learn more about what you can do with MyChart, go to [email or web address].   ? ?Any Other Special Instructions Will Be Listed Below (If Applicable). ? ?

## 2021-11-06 NOTE — Progress Notes (Signed)
 Electrophysiology Office Note:    Date:  11/06/2021   ID:  Jordan Chan, DOB 18-Nov-1950, MRN [id number]  PCP:  Jordan Melanie DASEN, NP  [facility] Cardiologist:  Chan Hanson, MD  [facility] Electrophysiologist:  OLE Chan HOLTS, MD   Referring MD: Jordan Lonni, MD   Chief Complaint: Atrial fibrillation  History of Present Illness:    Jordan Chan is a 71 y.o. female who presents for an evaluation of atrial fibrillation at the request of Dr. Hanson. Their medical history includes stress-induced cardiomyopathy in 2018, PE in 2021, anemia/leukocytosis/thrombocytosis followed by hematology, chronic low back pain, diabetes, COPD, sleep apnea intolerant to CPAP, IBS, GERD.  The patient was last seen by Jordan Chan on October 24, 2021.  She was admitted to [facility] in February 2023 with acute on chronic hypoxic respiratory failure and septic shock.  She required vasopressors.  She also had new onset atrial fibrillation with rapid ventricular rates.  Her EF was 25 to 30% during the hospitalization.  She was started on amiodarone  drip.  This converted her to normal rhythm.  She is on Eliquis  without bleeding issues.  She is doing much better since leaving the hospital.  Her lung infection has cleared up.  She is taking Eliquis  without any bleeding issues.  She is not felt any palpitations.  She is considering getting an Apple Watch to monitor her heart rhythms.    Past Medical History:  Diagnosis Date   Arthritis    Asthma    Atrial fibrillation (HCC)    Breast cancer (HCC) 1998   right breast ca with mastectomy and chemotherapy and radiation   Bronchitis    CHF (congestive heart failure) (HCC)    with Morphine  withdrawal   COPD (chronic obstructive pulmonary disease) (HCC)    Diabetes mellitus without complication (HCC)    Diverticulitis    diverticulosis also   Dyspnea    Endometriosis    GERD (gastroesophageal reflux disease)    History of shingles 2000-2005    Hypercholesteremia    Hypertension    IBS (irritable bowel syndrome)    Low back pain    a. Implanted morphine /bupivicaine/clonidine  pump.   Neuropathy    Orthopnea    Oxygen  dependent    uses at night   Personal history of chemotherapy    Personal history of radiation therapy    Pneumonia    pneumonia 5-6 times, history of bronchitis also   Scoliosis    Sleep apnea    does not use cpap   Stroke (HCC) 2010   TIA, 10 years ago   Withdrawal from sedative drug (HCC)    withdrawal from morphine  when pump batteries died    Past Surgical History:  Procedure Laterality Date   ABDOMINAL HYSTERECTOMY  1987   BACK SURGERY     Tailbone removed following fracture   BREAST SURGERY Right    mastectomy   CARDIAC CATHETERIZATION     CATARACT EXTRACTION W/PHACO Right 05/19/2019   Procedure: CATARACT EXTRACTION PHACO AND INTRAOCULAR LENS PLACEMENT (IOC), RIGHT, DIABETIC;  Surgeon: Jordan Rogue, MD;  Location: [facility] ORS;  Service: Ophthalmology;  Laterality: Right;  Lot # [id number] H US : 00:43.8 CDE: 4.59   CATARACT EXTRACTION W/PHACO Left 06/16/2019   Procedure: CATARACT EXTRACTION PHACO AND INTRAOCULAR LENS PLACEMENT (IOC) LEFT VISION BLUE DIABETIC;  Surgeon: Jordan Rogue, MD;  Location: [facility] ORS;  Service: Ophthalmology;  Laterality: Left;  Lot #[id number] H US : 00:46.9 CDE: 6.53   COCCYX REMOVAL     COLONOSCOPY  WITH PROPOFOL  N/A 01/01/2017   Procedure: COLONOSCOPY WITH PROPOFOL ;  Surgeon: Jordan Bi, MD;  Location[facility] END[facility]COPY;  Service: Endoscopy;  Laterality: N/A;   ELBOW ARTHROSCOPY WITH TENDON RECONSTRUCTION     ESOPHAGOGASTRODUODENOSCOPY (EGD) WITH PROPOFOL  N/A 01/01/2017   Procedure: ESOPHAGOGASTRODUODENOSCOPY (EGD) WITH PROPOFOL ;  Surgeon: Jordan Bi, MD;  Location: [facility] END[facility]COPY;  Service: Endoscopy;  Laterality: N/A;   INTRATHECAL PUMP IMPLANT     LEFT HEART CATH AND CORONARY ANGIOGRAPHY N/A 04/28/2017   Procedure: LEFT HEART CATH AND CORONARY ANGIOGRAPHY;  Surgeon: Jordan Bruckner, MD;  Location: [facility] INVASIVE CV LAB;  Service: Cardiovascular;  Laterality: N/A;   MASTECTOMY Right 06/1997   morphine  pump  2011   PLANTAR FASCIA RELEASE     TRIGGER FINGER RELEASE      Current Medications: Current Meds  Medication Sig   ACCU-CHEK AVIVA PLUS test strip TEST THREE TIMES DAILY   Accu-Chek Softclix Lancets lancets TEST BLOOD SUGAR THREE TIMES DAILY   albuterol  (PROVENTIL  HFA;VENTOLIN  HFA) 108 (90 BASE) MCG/ACT inhaler Inhale 2 puffs into the lungs every 6 (six) hours as needed for wheezing or shortness of breath.    Alcohol  Swabs (B-D SINGLE USE SWABS REGULAR) PADS USE TWICE DAILY  WITH  SUGAR  CHECKS   apixaban  (ELIQUIS ) 5 MG TABS tablet Take 1 tablet (5 mg total) by mouth 2 (two) times daily.   atorvastatin  (LIPITOR) 20 MG tablet Take 1 tablet (20 mg total) by mouth daily.   Blood Glucose Monitoring Suppl (TRUE METRIX METER) w/Device KIT Use to check blood sugar 4 times a day   cholecalciferol  (VITAMIN D3) 25 MCG (1000 UNIT) tablet Take 1,000 Units by mouth daily.   Cyanocobalamin  1000 MCG/ML KIT Inject 1,000 mcg as directed every 30 (thirty) days.   cyclobenzaprine  (FLEXERIL ) 5 MG tablet Take 5 mg by mouth 2 (two) times daily as needed for muscle spasms. Takes very rarely   diclofenac  (VOLTAREN ) 50 MG EC tablet TAKE 1 TABLET THREE TIMES DAILY   Dulaglutide  (TRULICITY ) 4.5 MG/0.5ML SOPN Inject 4.5 mg as directed once a week.   folic acid  (FOLVITE ) 1 MG tablet TAKE 1 TABLET EVERY DAY   furosemide  (LASIX ) 40 MG tablet TAKE 1 TABLET EVERY DAY   gabapentin  (NEURONTIN ) 800 MG tablet Take 1 tablet (800 mg total) by mouth 3 (three) times daily.   insulin  degludec (TRESIBA  FLEXTOUCH) 100 UNIT/ML FlexTouch Pen Inject 50 Units into the skin at bedtime.   losartan  (COZAAR ) 25 MG tablet Take 0.5 tablets (12.5 mg total) by mouth daily.   metFORMIN  (GLUCOPHAGE ) 1000 MG tablet Take 1 tablet (1,000 mg total) by mouth 2 (two) times daily with a meal.   metoprolol  succinate  (TOPROL -XL) 25 MG 24 hr tablet TAKE 1/2 TABLET EVERY DAY   montelukast  (SINGULAIR ) 10 MG tablet Take 10 mg by mouth at bedtime.   ondansetron  (ZOFRAN ) 4 MG tablet Take 1 tablet (4 mg total) by mouth every 8 (eight) hours as needed for nausea or vomiting.   PAIN MANAGEMENT INTRATHECAL, IT, PUMP 1 each by Intrathecal route. Intrathecal (IT) medication:  Morphine  Patient does not remember current. Adjusted every 2.5 months at [facility] in [place].   pantoprazole  (PROTONIX ) 40 MG tablet Take 1 tablet (40 mg total) by mouth daily.   TRELEGY ELLIPTA  100-62.5-25 MCG/INH AEPB INHALE 1 PUFF INTO THE LUNGS DAILY.   venlafaxine  XR (EFFEXOR -XR) 150 MG 24 hr capsule Take 1 capsule (150 mg total) by mouth daily.     Allergies:   Other,  Pain patch [menthol], Avelox  [moxifloxacin  hcl in nacl], Doxycycline , Erythromycin , Fentanyl , Moxifloxacin  hcl, Oxycontin [oxycodone], and Ozempic [semaglutide]   Social History   Socioeconomic History   Marital status: Unknown    Spouse name: Not on file   Number of children: Not on file   Years of education: Not on file   Highest education level: High school graduate  Occupational History   Occupation: retired  Tobacco Use   Smoking status: Former    Packs/day: 1.00    Years: 30.00    Pack years: 30.00    Types: Cigarettes    Quit date: 04/30/2004    Years since quitting: 17.5   Smokeless tobacco: Never  Vaping Use   Vaping Use: Never used  Substance and Sexual Activity   Alcohol  use: No   Drug use: No   Sexual activity: Not Currently    Birth control/protection: Post-menopausal  Other Topics Concern   Not on file  Social History Narrative   ** Merged History Encounter **       Social Determinants of Health   Financial Resource Strain: Low Risk    Difficulty of Paying Living Expenses: Not hard at all  Food Insecurity: No Food Insecurity   Worried About Programme researcher, broadcasting/film/video in the Last Year: Never true   Ran Out of Food in the  Last Year: Never true  Transportation Needs: No Transportation Needs   Lack of Transportation (Medical): No   Lack of Transportation (Non-Medical): No  Physical Activity: Inactive   Days of Exercise per Week: 0 days   Minutes of Exercise per Session: 0 min  Stress: No Stress Concern Present   Feeling of Stress : Not at all  Social Connections: Not on file     Family History: The patient's family history includes Arthritis in her paternal grandfather; Cancer in her paternal grandmother and sister; Cancer (age of onset: 69) in her father; Cancer (age of onset: 63) in her mother; Hyperlipidemia in her sister and son; Hypertension in her sister; Osteoporosis in her maternal grandmother; Seizures in her son; Thyroid  disease in her mother. There is no history of Breast cancer.  ROS:   Please see the history of present illness.    All other systems reviewed and are negative.  EKGs/Labs/Other Studies Reviewed:    The following studies were reviewed today:  October 31, 2021 echo Left ventricular function mildly decreased, 40 to 45% Right ventricular function normal No MR Sinus rhythm.  Intervals are normal including a QTc of 448 ms.  EKG:  The ekg ordered today demonstrates sinus rhythm.   Recent Labs: 09/11/2021: B Natriuretic Peptide 494.2 09/17/2021: ALT 21; Magnesium  1.7 09/26/2021: TSH 3.740 10/23/2021: BUN 18; Creatinine, Ser 1.08; Hemoglobin 12.6; Platelets 420; Potassium 4.7; Sodium 140  Recent Lipid Panel    Component Value Date/Time   CHOL 141 04/29/2021 0944   CHOL 127 07/26/2019 0817   TRIG 151 (H) 04/29/2021 0944   TRIG 104 07/26/2019 0817   HDL 50 04/29/2021 0944   VLDL 21 07/26/2019 0817   LDLCALC 65 04/29/2021 0944    Physical Exam:    VS:  BP 108/60 (BP Location: Left Arm, Patient Position: Sitting, Cuff Size: Normal)   Pulse 77   Ht 4' 10.5 (1.486 m)   Wt 143 lb (64.9 kg)   LMP  (LMP Unknown)   SpO2 94%   BMI 29.38 kg/m     Wt Readings from Last 3  Encounters:  11/06/21 143 lb (64.9 kg)  10/24/21 146 lb (66.2 kg)  10/23/21 145 lb 12.8 oz (66.1 kg)     GEN:  Well nourished, well developed in no acute distress.  Elderly HEENT: Normal NECK: No JVD; No carotid bruits LYMPHATICS: No lymphadenopathy CARDIAC: RRR, no murmurs, rubs, gallops RESPIRATORY:  Clear to auscultation without rales, wheezing or rhonchi  ABDOMEN: Soft, non-tender, non-distended MUSCULOSKELETAL:  No edema; No deformity  SKIN: Warm and dry NEUROLOGIC:  Alert and oriented x 3 PSYCHIATRIC:  Normal affect       ASSESSMENT:    1. PAF (paroxysmal atrial fibrillation) (HCC)   2. Chronic HFrEF (heart failure with reduced ejection fraction) (HCC)   3. History of pulmonary embolus (PE)    PLAN:    In order of problems listed above:   #Paroxysmal atrial fibrillation In the setting of severe sepsis.  This was her first diagnosis of atrial fibrillation.  Rhythm control will be indicated for her given her chronic systolic heart failure.  She is taking Eliquis  for stroke prophylaxis.  Now that she is in normal rhythm, I would continue to monitor the heart rhythm.  She is planning on getting an Apple Watch which I think would be a great idea to monitor her rhythms.  If she has recurrence of her arrhythmia, I would plan on starting Tikosyn to help maintain normal rhythm.  I did discuss Tikosyn loading during today's clinic appointment and she understands this would require an inpatient hospitalization to monitor for off target effects of the drug.    #Chronic systolic heart failure NYHA class II today.  Warm and dry on exam.  Continue current medical therapy including metoprolol , losartan , Lasix .  #History of PE Continue Eliquis    Follow-up with EP on an as-needed basis.  Follow-up with primary cardiologist, Dr. Mady, in 6 to 12 months.  Medication Adjustments/Labs and Tests Ordered: Current medicines are reviewed at length with the patient today.  Concerns regarding  medicines are outlined above.  No orders of the defined types were placed in this encounter.  No orders of the defined types were placed in this encounter.    Signed, Ole Chan. Cindie, MD, [facility], [facility] 11/06/2021 11:12 AM    Electrophysiology [facility]

## 2021-11-08 NOTE — Telephone Encounter (Signed)
Reviewed results and recommendations with patient. She was agreeable with plan. Reviewed all instructions for Lexiscan and requested she please call our office on Monday to assist with getting this test scheduled. She verbalized understanding of our conversation with no further questions at this time.  ? ? ?[place] ? ?Your caregiver has ordered a Stress Test with nuclear imaging. The purpose of this test is to evaluate the blood supply to your heart muscle. This procedure is referred to as a "Non-Invasive Stress Test." This is because other than having an IV started in your vein, nothing is inserted or "invades" your body. Cardiac stress tests are done to find areas of poor blood flow to the heart by determining the extent of coronary artery disease (CAD). Some patients exercise on a treadmill, which naturally increases the blood flow to your heart, while others who are  unable to walk on a treadmill due to physical limitations have a pharmacologic/chemical stress agent called Lexiscan . This medicine will mimic walking on a treadmill by temporarily increasing your coronary blood flow.  ? ?Please note: these test may take anywhere between 2-4 hours to complete ? ?PLEASE REPORT TO [facility]ICAL [facility] ENTRANCE  ?THE VOLUNTEERS AT THE FIRST DESK WILL DIRECT YOU WHERE TO GO ? ?Date of Procedure:______________________ ? ?Arrival Time for Procedure:_____________________________ ? ?Instructions regarding medication:  ? ?__XX__ : Hold metformin the night before and morning of your procedure ? ?__XX_:  Hold Furosemide the morning of your procedure.  ? ?__XX__:  ONLY take 25 units of your insulin the night before.  ? ?PLEASE NOTIFY THE OFFICE AT LEAST 28 HOURS IN ADVANCE IF YOU ARE UNABLE TO KEEP YOUR APPOINTMENT.  [phone] ?AND  ?PLEASE NOTIFY NUCLEAR MEDICINE AT [facility] AT LEAST 50 HOURS IN ADVANCE IF YOU ARE UNABLE TO KEEP YOUR APPOINTMENT. [phone] ? ?How to prepare for your Myoview test: ? ?Do not eat or drink  after midnight ?No caffeine for 24 hours prior to test ?No smoking 24 hours prior to test. ?Your medication may be taken with water.  If your doctor stopped a medication because of this test, do not take that medication. ?Ladies, please do not wear dresses.  Skirts or pants are appropriate. Please wear a short sleeve shirt. ?No perfume, cologne or lotion. ?Wear comfortable walking shoes. No heels! ? ? ?

## 2021-11-08 NOTE — Telephone Encounter (Signed)
Left voicemail message to call back for review of results.  

## 2021-11-11 ENCOUNTER — Telehealth: Payer: Self-pay

## 2021-11-11 ENCOUNTER — Telehealth: Payer: Medicare HMO

## 2021-11-11 NOTE — Addendum Note (Signed)
Addended by: Valora Corporal on: 11/11/2021 09:51 AM ? ? Modules accepted: Orders ? ?

## 2021-11-11 NOTE — Telephone Encounter (Signed)
Scheduled

## 2021-11-11 NOTE — Telephone Encounter (Signed)
?  Care Management  ? ?Follow Up Note ? ? ?11/11/2021 ?Name: Jordan Chan MRN: [id number] DOB: 01-Jun-1951 ? ? ?Referred by: [name], NP ?Reason for referral : No chief complaint on file. ? ? ?An unsuccessful telephone outreach was attempted today. The patient was referred to the case management team for assistance with care management and care coordination.  ? ?Follow Up Plan:  Will notify Care guide and attempt to RS, phone dropped during our conversation - multiple attempts, then sent to VM. Unclear if patient was just hanging up on me intentionally. Please attempt to RS or disenroll from CCM services if that is what the patient wishes.  ? ?Madelin Rear, PharmD, BCGP ?Clinical Pharmacist  ?[facility]  ?[phone] ?  ?

## 2021-11-14 ENCOUNTER — Encounter
Admission: RE | Admit: 2021-11-14 | Discharge: 2021-11-14 | Disposition: A | Discharge status: Home / Self Care | Payer: Medicare HMO | Source: Ambulatory Visit | Attending: Nurse Practitioner | Admitting: Nurse Practitioner

## 2021-11-14 ENCOUNTER — Other Ambulatory Visit: Payer: Self-pay | Admitting: Nurse Practitioner

## 2021-11-14 DIAGNOSIS — I428 Other cardiomyopathies: Secondary | ICD-10-CM | POA: Insufficient documentation

## 2021-11-14 LAB — NM MYOCAR MULTI W/SPECT W/WALL MOTION / EF
Estimated workload: 1
Exercise duration (min): 0 min
Exercise duration (sec): 0 s
LV dias vol: 83 mL (ref 46–106)
LV sys vol: 39 mL
MPHR: 149 {beats}/min
Nuc Stress EF: 53 %
Peak HR: 115 {beats}/min
Percent HR: 77 %
Rest HR: 81 {beats}/min
Rest Nuclear Isotope Dose: 10.9 mCi
SDS: 0
SRS: 14
SSS: 12
ST Depression (mm): 0 mm
Stress Nuclear Isotope Dose: 30.6 mCi
TID: 0.85

## 2021-11-14 MED ORDER — TECHNETIUM TC 99M TETROFOSMIN IV KIT
30.0000 | PACK | Freq: Once | INTRAVENOUS | Status: AC | PRN
  Administered 2021-11-14: 30.6 via INTRAVENOUS

## 2021-11-14 MED ORDER — TECHNETIUM TC 99M TETROFOSMIN IV KIT
10.0000 | PACK | Freq: Once | INTRAVENOUS | Status: AC | PRN
  Administered 2021-11-14: 10.86 via INTRAVENOUS

## 2021-11-14 MED ORDER — REGADENOSON 0.4 MG/5ML IV SOLN
0.4000 mg | Freq: Once | INTRAVENOUS | Status: AC
  Administered 2021-11-14: 0.4 mg via INTRAVENOUS
  Filled 2021-11-14: qty 5

## 2021-11-14 NOTE — Telephone Encounter (Signed)
Requested medications are due for refill today.  yes ? ?Requested medications are on the active medications list.  yes ? ?Last refill. 01/16/2021 #200 2 refills ? ?Future visit scheduled.   yes ? ?Notes to clinic.  Refill not delegated. ? ? ? ?Requested Prescriptions  ?Pending Prescriptions Disp Refills  ? Alcohol Swabs (DROPSAFE ALCOHOL PREP) 70 % PADS [Pharmacy Med Name: DROPSAFE ALCOHOL PREP PADS 70 % Pad] 200 each 2  ?  Sig: USE TWICE DAILY  WITH  SUGAR  CHECKS  ?  ? Off-Protocol Failed - 11/14/2021  3:22 AM  ?  ?  Failed - Medication not assigned to a protocol, review manually.  ?  ?  Passed - Valid encounter within last 12 months  ?  Recent Outpatient Visits   ? ?      ? 3 weeks ago Atrial fibrillation with RVR ([place])  ? [place], [place] T, NP  ? 1 month ago Sepsis due to pneumonia [facility])  ? [place]by, [place]il[place] T, NP  ? 1 month ago Sepsis due to pneumonia [facility])  ? Ismay, [place] T, NP  ? 3 months ago COPD, moderate ([place])  ? [place], [place] T, NP  ? 3 months ago Type 2 diabetes, controlled, with neuropathy ([place])  ? [facility] [place], Henrine Screws T, NP  ? ?  ?  ?Future Appointments   ? ?        ? In 3 weeks [name], [name], NP [employer], [facility]  ? In 1 month  [place], [facility]  ? In 5 months End, Harrell Gave, MD [facility], [facility]  ? ?  ? ?  ?  ?  ?Refused Prescriptions Disp Refills  ? atorvastatin (LIPITOR) 20 MG tablet [Pharmacy Med Name: ATORVASTATIN CALCIUM 20 MG Tablet] 90 tablet 4  ?  Sig: TAKE 1 TABLET EVERY DAY  ?  ? Cardiovascular:  Antilipid - Statins Failed - 11/14/2021  3:22 AM  ?  ?  Failed - Lipid Panel in normal range within the last 12 months  ?  Cholesterol, Total  ?Date Value Ref Range Status  ?04/29/2021 141 100 - 199 mg/dL Final  ? ?Cholesterol Piccolo, Marie  ?Date Value Ref Range Status  ?07/26/2019 127 <200 mg/dL Final  ?  Comment:  ?                           Desirable                <200 ?                        Borderline High      200- 239 ?                        High                     >239 ?  ? ?LDL Chol Calc (NIH)  ?Date Value Ref Range Status  ?04/29/2021 65 0 - 99 mg/dL Final  ? ?HDL  ?Date Value Ref Range Status  ?04/29/2021 50 >39 mg/dL Final  ? ?Triglycerides  ?Date Value Ref Range Status  ?04/29/2021 151 (H) 0 - 149 mg/dL Final  ? ?Triglycerides Piccolo,Waived  ?Date Value Ref Range Status  ?07/26/2019 104 <150 mg/dL Final  ?  Comment:  ?                          Normal                   <150 ?                        Borderline High     150 - 199 ?                        High                200 - 499 ?                        Very High                >499 ?  ? ?  ?  ?  Passed - Patient is not pregnant  ?  ?  Passed - Valid encounter within last 12 months  ?  Recent Outpatient Visits   ? ?      ? 3 weeks ago Atrial fibrillation with RVR ([place])  ? [place], [place] T, NP  ? 1 month ago Sepsis due to pneumonia [facility])  ? [place], [place] T, NP  ? 1 month ago Sepsis due to pneumonia [facility])  ? [place], [place] T, NP  ? 3 months ago COPD, moderate ([place])  ? Rowan, [place] T, NP  ? 3 months ago Type 2 diabetes, controlled, with neuropathy ([place])  ? [facility] [place], Henrine Screws T, NP  ? ?  ?  ?Future Appointments   ? ?        ? In 3 weeks [name], [name], NP [employer], [facility]  ? In 1 month  [place], [facility]  ? In 5 months End, Harrell Gave, MD [facility], [facility]  ? ?  ? ?  ?  ?  ?  ?

## 2021-11-14 NOTE — Telephone Encounter (Signed)
Refilled 04/29/2021 #90 4 refills. ?Requested Prescriptions  ?Pending Prescriptions Disp Refills  ?? atorvastatin (LIPITOR) 20 MG tablet [Pharmacy Med Name: ATORVASTATIN CALCIUM 20 MG Tablet] 90 tablet 4  ?  Sig: TAKE 1 TABLET EVERY DAY  ?  ? Cardiovascular:  Antilipid - Statins Failed - 11/14/2021  3:22 AM  ?  ?  Failed - Lipid Panel in normal range within the last 12 months  ?  Cholesterol, Total  ?Date Value Ref Range Status  ?04/29/2021 141 100 - 199 mg/dL Final  ? ?Cholesterol Piccolo, [place]  ?Date Value Ref Range Status  ?07/26/2019 127 <200 mg/dL Final  ?  Comment:  ?                          Desirable                <200 ?                        Borderline High      200- 239 ?                        High                     >239 ?  ? ?LDL Chol Calc (NIH)  ?Date Value Ref Range Status  ?04/29/2021 65 0 - 99 mg/dL Final  ? ?HDL  ?Date Value Ref Range Status  ?04/29/2021 50 >39 mg/dL Final  ? ?Triglycerides  ?Date Value Ref Range Status  ?04/29/2021 151 (H) 0 - 149 mg/dL Final  ? ?Triglycerides Piccolo,Waived  ?Date Value Ref Range Status  ?07/26/2019 104 <150 mg/dL Final  ?  Comment:  ?                          Normal                   <150 ?                        Borderline High     150 - 199 ?                        High                200 - 499 ?                        Very High                >499 ?  ? ?  ?  ?  Passed - Patient is not pregnant  ?  ?  Passed - Valid encounter within last 12 months  ?  Recent Outpatient Visits   ?      ? 3 weeks ago Atrial fibrillation with RVR ([place])  ? [place], [place] T, NP  ? 1 month ago Sepsis due to pneumonia [facility])  ? [place], [place] T, NP  ? 1 month ago Sepsis due to pneumonia [facility])  ? [place], [place] T, NP  ? 3 months ago COPD, moderate ([place])  ? Carmen, [place] T, NP  ? 3 months ago Type 2 diabetes, controlled, with neuropathy ([place])  ?  [facility] [place],  Henrine Screws T, NP  ?  ?  ?Future Appointments   ?        ? In 3 weeks [name], [name], NP [employer], [facility]  ? In 1 month  [place], [facility]  ? In 5 months End, Harrell Gave, MD [facility], [facility]  ?  ? ?  ?  ?  ?? Alcohol Swabs (DROPSAFE ALCOHOL PREP) 70 % PADS [Pharmacy Med Name: DROPSAFE ALCOHOL PREP PADS 70 % Pad] 200 each 2  ?  Sig: USE TWICE DAILY  WITH  SUGAR  CHECKS  ?  ? Off-Protocol Failed - 11/14/2021  3:22 AM  ?  ?  Failed - Medication not assigned to a protocol, review manually.  ?  ?  Passed - Valid encounter within last 12 months  ?  Recent Outpatient Visits   ?      ? 3 weeks ago Atrial fibrillation with RVR ([place])  ? [place], [place] T, NP  ? 1 month ago Sepsis due to pneumonia [facility])  ? [place], [place] T, NP  ? 1 month ago Sepsis due to pneumonia [facility])  ? [place], [place] T, NP  ? 3 months ago COPD, moderate ([place])  ? [place], [place] T, NP  ? 3 months ago Type 2 diabetes, controlled, with neuropathy ([place])  ? [facility] [place], Henrine Screws T, NP  ?  ?  ?Future Appointments   ?        ? In 3 weeks [name], [name], NP [employer], [facility]  ? In 1 month  [place], [facility]  ? In 5 months End, Harrell Gave, MD [facility], [facility]  ?  ? ?  ?  ?  ? ?

## 2021-11-18 ENCOUNTER — Other Ambulatory Visit: Payer: Self-pay

## 2021-11-18 DIAGNOSIS — R1310 Dysphagia, unspecified: Secondary | ICD-10-CM

## 2021-11-18 DIAGNOSIS — Z1211 Encounter for screening for malignant neoplasm of colon: Secondary | ICD-10-CM

## 2021-11-18 MED ORDER — CLENPIQ 10-3.5-12 MG-GM -GM/160ML PO SOLN: 320.0000 mL | Freq: Once | ORAL | 0 refills | Status: AC

## 2021-11-18 NOTE — Progress Notes (Unsigned)
Patient states she will do colonoscopy and EGD on 12/11/21 with Dr. Marius Ditch. Went over instructions and mailed them. She said they wanted her to stop the Eliquis 2 days before procedure. I will also get clearance also. Sent prep to the pharmacy  ?

## 2021-12-02 ENCOUNTER — Telehealth: Payer: Self-pay

## 2021-12-02 NOTE — Chronic Care Management (AMB) (Signed)
?  Care Management  ? ?Note ? ?12/02/2021 ?Name: [name] MRN: [id number] DOB: August 09, 1950 ? ?[name] is a 71 y.o. year old female who is a primary care patient of [name], NP and is actively engaged with the care management team. I reached out to [name] by phone today to assist with re-scheduling a follow up visit with the Pharmacist ? ?Follow up plan: ?Patient declines further follow up and engagement by the Pharmacist.  Appropriate care team members and provider have been notified via electronic communication.  ? ?Noreene Larsson, RMA ?Care Guide, Embedded Care Coordination ?[place]  [facility]  ?[place], [place] [address] ?Direct Dial: [phone] ?[email or web address] ?[email or web address]: [email or web address]  ? ?

## 2021-12-03 DIAGNOSIS — J449 Chronic obstructive pulmonary disease, unspecified: Secondary | ICD-10-CM | POA: Diagnosis not present

## 2021-12-03 DIAGNOSIS — J45909 Unspecified asthma, uncomplicated: Secondary | ICD-10-CM | POA: Diagnosis not present

## 2021-12-04 ENCOUNTER — Other Ambulatory Visit: Payer: Self-pay | Admitting: Nurse Practitioner

## 2021-12-04 DIAGNOSIS — G893 Neoplasm related pain (acute) (chronic): Secondary | ICD-10-CM | POA: Diagnosis not present

## 2021-12-04 DIAGNOSIS — M542 Cervicalgia: Secondary | ICD-10-CM | POA: Diagnosis not present

## 2021-12-04 DIAGNOSIS — Z978 Presence of other specified devices: Secondary | ICD-10-CM | POA: Diagnosis not present

## 2021-12-04 DIAGNOSIS — M5442 Lumbago with sciatica, left side: Secondary | ICD-10-CM | POA: Diagnosis not present

## 2021-12-04 NOTE — Telephone Encounter (Signed)
Requested medication (s) are due for refill today: expired medications ? ?Requested medication (s) are on the active medication list: yes ? ?Last refill:  metformin- 10/25/20 #180 4 refills , trelegy ellipta- 12/03/20 #180 4 refills ? ?Future visit scheduled: yes in 1 week ? ?Notes to clinic:  expired medications do you want to renew Rxs? ? ? ?  ?Requested Prescriptions  ?Pending Prescriptions Disp Refills  ? metFORMIN (GLUCOPHAGE) 1000 MG tablet [Pharmacy Med Name: METFORMIN HYDROCHLORIDE 1000 MG Tablet] 180 tablet 4  ?  Sig: TAKE 1 TABLET TWICE DAILY WITH MEALS  ?  ? Endocrinology:  Diabetes - Biguanides Failed - 12/04/2021  2:41 AM  ?  ?  Failed - Cr in normal range and within 360 days  ?  Creatinine  ?Date Value Ref Range Status  ?09/14/2013 0.68 0.60 - 1.30 mg/dL Final  ? ?Creatinine, Ser  ?Date Value Ref Range Status  ?10/23/2021 1.08 (H) 0.57 - 1.00 mg/dL Final  ?  ?  ?  ?  Failed - HBA1C is between 0 and 7.9 and within 180 days  ?  Hemoglobin A1C  ?Date Value Ref Range Status  ?04/28/2016 7.7%  Final  ? ?HB A1C (BAYER DCA - WAIVED)  ?Date Value Ref Range Status  ?07/31/2021 8.0 (H) 4.8 - 5.6 % Final  ?  Comment:  ?           Prediabetes: 5.7 - 6.4 ?         Diabetes: >6.4 ?         Glycemic control for adults with diabetes: <7.0 ?  ? ?Hgb A1c MFr Bld  ?Date Value Ref Range Status  ?09/05/2021 8.0 (H) 4.8 - 5.6 % Final  ?  Comment:  ?  (NOTE) ?        Prediabetes: 5.7 - 6.4 ?        Diabetes: >6.4 ?        Glycemic control for adults with diabetes: <7.0 ?  ?  ?  ?  ?  Failed - eGFR in normal range and within 360 days  ?  EGFR (African American)  ?Date Value Ref Range Status  ?09/14/2013 >60  Final  ? ?GFR calc Af Wyvonnia Lora  ?Date Value Ref Range Status  ?07/26/2020 92 >59 mL/min/1.73 Final  ?  Comment:  ?  **In accordance with recommendations from the NKF-ASN Task force,** ?  [facility] is in the process of updating its eGFR calculation to the ?  2021 CKD-EPI creatinine equation that estimates kidney function ?  without a  race variable. ?  ? ?EGFR (Non-African Amer.)  ?Date Value Ref Range Status  ?09/14/2013 >60  Final  ?  Comment:  ?  eGFR values <94m/min/1.73 m2 may be an indication of chronic ?kidney disease (CKD). ?Calculated eGFR is useful in patients with stable renal function. ?The eGFR calculation will not be reliable in acutely ill patients ?when serum creatinine is changing rapidly. It is not useful in  ?patients on dialysis. The eGFR calculation may not be applicable ?to patients at the low and high extremes of body sizes, pregnant ?women, and vegetarians. ?  ? ?GFR, Estimated  ?Date Value Ref Range Status  ?09/14/2021 >60 >60 mL/min Final  ?  Comment:  ?  (NOTE) ?Calculated using the CKD-EPI Creatinine Equation (2021) ?  ? ?eGFR  ?Date Value Ref Range Status  ?10/23/2021 55 (L) >59 mL/min/1.73 Final  ?  ?  ?  ?  Passed - B12 Level in normal  range and within 720 days  ?  Vitamin B-12  ?Date Value Ref Range Status  ?04/29/2021 508 232 - 1,245 pg/mL Final  ?  ?  ?  ?  Passed - Valid encounter within last 6 months  ?  Recent Outpatient Visits   ? ?      ? 1 month ago Atrial fibrillation with RVR ([place])  ? [place], [place] T, NP  ? 2 months ago Sepsis due to pneumonia [facility])  ? [place], Marydel T, NP  ? 2 months ago Sepsis due to pneumonia [facility])  ? [place], [place] T, NP  ? 3 months ago COPD, moderate ([place])  ? Clarinda, [place]wy[place] T, NP  ? 4 months ago Type 2 diabetes, controlled, with neuropathy ([place])  ? [facility] [place], Henrine Screws T, NP  ? ?  ?  ?Future Appointments   ? ?        ? In 1 week [name], NP [facility], [facility]  ? In 1 week  [facility], [facility]  ? In 4 months End, Harrell Gave, MD [facility], [facility]  ? ?  ? ? ?  ?  ?  Passed - CBC within normal limits and completed in the last 12 months  ?  WBC  ?Date Value Ref Range Status  ?10/23/2021 9.5 3.4 - 10.8 x10E3/uL  Final  ?09/13/2021 14.1 (H) 4.0 - 10.5 K/uL Final  ? ?RBC  ?Date Value Ref Range Status  ?10/23/2021 4.06 3.77 - 5.28 x10E6/uL Final  ?09/13/2021 3.52 (L) 3.87 - 5.11 MIL/uL Final  ? ?Hemoglobin  ?Date Value Ref Range Status  ?10/23/2021 12.6 11.1 - 15.9 g/dL Final  ? ?Hematocrit  ?Date Value Ref Range Status  ?10/23/2021 37.5 34.0 - 46.6 % Final  ? ?MCHC  ?Date Value Ref Range Status  ?10/23/2021 33.6 31.5 - 35.7 g/dL Final  ?09/13/2021 32.0 30.0 - 36.0 g/dL Final  ? ?MCH  ?Date Value Ref Range Status  ?10/23/2021 31.0 26.6 - 33.0 pg Final  ?09/13/2021 30.4 26.0 - 34.0 pg Final  ? ?MCV  ?Date Value Ref Range Status  ?10/23/2021 92 79 - 97 fL Final  ?09/14/2013 89 80 - 100 fL Final  ? ?No results found for: PLTCOUNTKUC, LABPLAT, [place] ?RDW  ?Date Value Ref Range Status  ?10/23/2021 14.2 11.7 - 15.4 % Final  ?09/14/2013 14.7 (H) 11.5 - 14.5 % Final  ? ?  ?  ?  ? TRELEGY ELLIPTA 100-62.5-25 MCG/ACT AEPB [Pharmacy Med Name: Donnal Debar 100-62.5-25 MCG/ACT Aerosol Powder Breath Activated] 180 each 4  ?  Sig: INHALE 1 PUFF INTO THE LUNGS DAILY  ?  ? There is no refill protocol information for this order  ?  ? ?

## 2021-12-08 NOTE — Patient Instructions (Signed)

## 2021-12-10 ENCOUNTER — Encounter: Payer: Self-pay | Admitting: Gastroenterology

## 2021-12-11 ENCOUNTER — Ambulatory Visit: Payer: Medicare HMO | Admitting: Certified Registered"

## 2021-12-11 ENCOUNTER — Other Ambulatory Visit: Payer: Self-pay | Admitting: Nurse Practitioner

## 2021-12-11 ENCOUNTER — Ambulatory Visit
Admission: RE | Admit: 2021-12-11 | Discharge: 2021-12-11 | Disposition: A | Discharge status: Home / Self Care | Payer: Medicare HMO | Attending: Gastroenterology | Admitting: Gastroenterology

## 2021-12-11 ENCOUNTER — Ambulatory Visit: Payer: Medicare HMO | Admitting: Nurse Practitioner

## 2021-12-11 ENCOUNTER — Encounter
Admission: RE | Disposition: A | Discharge status: Home / Self Care | Payer: Self-pay | Source: Home / Self Care | Attending: Gastroenterology

## 2021-12-11 DIAGNOSIS — K3189 Other diseases of stomach and duodenum: Secondary | ICD-10-CM | POA: Insufficient documentation

## 2021-12-11 DIAGNOSIS — K589 Irritable bowel syndrome without diarrhea: Secondary | ICD-10-CM | POA: Insufficient documentation

## 2021-12-11 DIAGNOSIS — Z853 Personal history of malignant neoplasm of breast: Secondary | ICD-10-CM | POA: Diagnosis not present

## 2021-12-11 DIAGNOSIS — I252 Old myocardial infarction: Secondary | ICD-10-CM | POA: Diagnosis not present

## 2021-12-11 DIAGNOSIS — F32A Depression, unspecified: Secondary | ICD-10-CM | POA: Diagnosis not present

## 2021-12-11 DIAGNOSIS — G4730 Sleep apnea, unspecified: Secondary | ICD-10-CM | POA: Insufficient documentation

## 2021-12-11 DIAGNOSIS — M1990 Unspecified osteoarthritis, unspecified site: Secondary | ICD-10-CM | POA: Diagnosis not present

## 2021-12-11 DIAGNOSIS — I4891 Unspecified atrial fibrillation: Secondary | ICD-10-CM | POA: Diagnosis not present

## 2021-12-11 DIAGNOSIS — J449 Chronic obstructive pulmonary disease, unspecified: Secondary | ICD-10-CM | POA: Diagnosis not present

## 2021-12-11 DIAGNOSIS — R1314 Dysphagia, pharyngoesophageal phase: Secondary | ICD-10-CM | POA: Diagnosis present

## 2021-12-11 DIAGNOSIS — Z9981 Dependence on supplemental oxygen: Secondary | ICD-10-CM | POA: Insufficient documentation

## 2021-12-11 DIAGNOSIS — I509 Heart failure, unspecified: Secondary | ICD-10-CM | POA: Insufficient documentation

## 2021-12-11 DIAGNOSIS — E1140 Type 2 diabetes mellitus with diabetic neuropathy, unspecified: Secondary | ICD-10-CM | POA: Insufficient documentation

## 2021-12-11 DIAGNOSIS — R1310 Dysphagia, unspecified: Secondary | ICD-10-CM

## 2021-12-11 DIAGNOSIS — K319 Disease of stomach and duodenum, unspecified: Secondary | ICD-10-CM | POA: Diagnosis not present

## 2021-12-11 DIAGNOSIS — I110 Hypertensive heart disease with heart failure: Secondary | ICD-10-CM | POA: Insufficient documentation

## 2021-12-11 DIAGNOSIS — Z87891 Personal history of nicotine dependence: Secondary | ICD-10-CM | POA: Diagnosis not present

## 2021-12-11 DIAGNOSIS — Z1211 Encounter for screening for malignant neoplasm of colon: Secondary | ICD-10-CM | POA: Insufficient documentation

## 2021-12-11 DIAGNOSIS — K219 Gastro-esophageal reflux disease without esophagitis: Secondary | ICD-10-CM | POA: Diagnosis not present

## 2021-12-11 HISTORY — PX: ESOPHAGOGASTRODUODENOSCOPY (EGD) WITH PROPOFOL: SHX5813

## 2021-12-11 HISTORY — PX: COLONOSCOPY WITH PROPOFOL: SHX5780

## 2021-12-11 LAB — GLUCOSE, CAPILLARY: Glucose-Capillary: 173 mg/dL — ABNORMAL HIGH (ref 70–99)

## 2021-12-11 SURGERY — COLONOSCOPY WITH PROPOFOL
Anesthesia: General

## 2021-12-11 MED ORDER — TRULICITY 4.5 MG/0.5ML ~~LOC~~ SOAJ: 4.5000 mg | SUBCUTANEOUS | 2 refills | Status: DC

## 2021-12-11 MED ORDER — PROPOFOL 500 MG/50ML IV EMUL
INTRAVENOUS | Status: AC
  Filled 2021-12-11: qty 100

## 2021-12-11 MED ORDER — SODIUM CHLORIDE 0.9 % IV SOLN
INTRAVENOUS | Status: DC
  Administered 2021-12-11: 20 mL/h via INTRAVENOUS

## 2021-12-11 MED ORDER — PROPOFOL 10 MG/ML IV BOLUS
INTRAVENOUS | Status: DC | PRN
  Administered 2021-12-11: 70 mg via INTRAVENOUS

## 2021-12-11 MED ORDER — LIDOCAINE HCL (CARDIAC) PF 100 MG/5ML IV SOSY
PREFILLED_SYRINGE | INTRAVENOUS | Status: DC | PRN
Start: 2021-12-11 — End: 2021-12-11
  Administered 2021-12-11: 50 mg via INTRAVENOUS

## 2021-12-11 MED ORDER — PROPOFOL 500 MG/50ML IV EMUL
INTRAVENOUS | Status: DC | PRN
  Administered 2021-12-11: 130 ug/kg/min via INTRAVENOUS

## 2021-12-11 NOTE — Op Note (Signed)
[facility] ?Gastroenterology ?Patient Name: Jordan Chan ?Procedure Date: 12/11/2021 7:52 AM ?MRN: [id number] ?Account #: [id number] ?Date of Birth: 1951/03/30 ?Admit Type: Outpatient ?Age: 71 ?Room: [facility] ENDO ROOM 4 ?Gender: Female ?Note Status: Finalized ?Instrument Name: Upper-Endoscope [id number] ?Procedure:             Upper GI endoscopy ?Indications:           Esophageal dysphagia ?Providers:             Lin Landsman MD, MD ?Medicines:             General Anesthesia ?Complications:         No immediate complications. Estimated blood loss: None. ?Procedure:             Pre-Anesthesia Assessment: ?                       - Prior to the procedure, a History and Physical was  ?                       performed, and patient medications and allergies were  ?                       reviewed. The patient is competent. The risks and  ?                       benefits of the procedure and the sedation options and  ?                       risks were discussed with the patient. All questions  ?                       were answered and informed consent was obtained.  ?                       Patient identification and proposed procedure were  ?                       verified by the physician, the nurse, the  ?                       anesthesiologist, the anesthetist and the technician  ?                       in the pre-procedure area in the procedure room in the  ?                       endoscopy suite. Mental Status Examination: alert and  ?                       oriented. Airway Examination: normal oropharyngeal  ?                       airway and neck mobility. Respiratory Examination:  ?                       clear to auscultation. CV Examination: normal.  ?                       Prophylactic Antibiotics: The patient  does not require  ?                       prophylactic antibiotics. Prior Anticoagulants: The  ?                       patient has taken Eliquis (apixaban), last dose was 3  ?                        days prior to procedure. ASA Grade Assessment: III - A  ?                       patient with severe systemic disease. After reviewing  ?                       the risks and benefits, the patient was deemed in  ?                       satisfactory condition to undergo the procedure. The  ?                       anesthesia plan was to use general anesthesia.  ?                       Immediately prior to administration of medications,  ?                       the patient was re-assessed for adequacy to receive  ?                       sedatives. The heart rate, respiratory rate, oxygen  ?                       saturations, blood pressure, adequacy of pulmonary  ?                       ventilation, and response to care were monitored  ?                       throughout the procedure. The physical status of the  ?                       patient was re-assessed after the procedure. ?                       After obtaining informed consent, the endoscope was  ?                       passed under direct vision. Throughout the procedure,  ?                       the patient's blood pressure, pulse, and oxygen  ?                       saturations were monitored continuously. The Endoscope  ?                       was introduced through the mouth, and advanced to the  ?  second part of duodenum. The upper GI endoscopy was  ?                       accomplished without difficulty. The patient tolerated  ?                       the procedure well. ?Findings: ?     The duodenal bulb and second portion of the duodenum were normal. ?     Striped mildly erythematous mucosa without bleeding was found in the  ?     gastric antrum. Biopsies were taken with a cold forceps for Helicobacter  ?     pylori testing. ?     The gastric body and incisura were normal. Biopsies were taken with a  ?     cold forceps for Helicobacter pylori testing. ?     The cardia and gastric fundus were normal on retroflexion. ?      Esophagogastric landmarks were identified: the gastroesophageal junction  ?     was found at 35 cm from the incisors. ?     The gastroesophageal junction and examined esophagus were normal.  ?     Biopsies were taken with a cold forceps for histology. ?Impression:            - Normal duodenal bulb and second portion of the  ?                       duodenum. ?                       - Erythematous mucosa in the antrum. Biopsied. ?                       - Normal gastric body and incisura. Biopsied. ?                       - Esophagogastric landmarks identified. ?                       - Normal gastroesophageal junction and esophagus.  ?                       Biopsied. ?Recommendation:        - Await pathology results. ?                       - Proceed with colonoscopy as scheduled ?                       See colonoscopy report ?Procedure Code(s):     --- Professional --- ?                       [phone], Esophagogastroduodenoscopy, flexible,  ?                       transoral; with biopsy, single or multiple ?Diagnosis Code(s):     --- Professional --- ?                       K31.89, Other diseases of stomach and duodenum ?  R13.14, Dysphagia, pharyngoesophageal phase ?CPT copyright 2019 American Medical Association. All rights reserved. ?The codes documented in this report are preliminary and upon coder review may  ?be revised to meet current compliance requirements. ?Dr. Ulyess Mort ?Lin Landsman MD, MD ?12/11/2021 8:08:43 AM ?This report has been signed electronically. ?Number of Addenda: 0 ?Note Initiated On: 12/11/2021 7:52 AM ?Estimated Blood Loss:  Estimated blood loss: none. ?     [facility] ?

## 2021-12-11 NOTE — Telephone Encounter (Signed)
Requested Prescriptions  ?Pending Prescriptions Disp Refills  ?? Dulaglutide (TRULICITY) 4.5 GP/4.9IY SOPN 6 mL 2  ?  Sig: Inject 4.5 mg as directed once a week.  ?  ? Endocrinology:  Diabetes - GLP-1 Receptor Agonists Failed - 12/11/2021  1:57 PM  ?  ?  Failed - HBA1C is between 0 and 7.9 and within 180 days  ?  Hemoglobin A1C  ?Date Value Ref Range Status  ?04/28/2016 7.7%  Final  ? ?HB A1C (BAYER DCA - WAIVED)  ?Date Value Ref Range Status  ?07/31/2021 8.0 (H) 4.8 - 5.6 % Final  ?  Comment:  ?           Prediabetes: 5.7 - 6.4 ?         Diabetes: >6.4 ?         Glycemic control for adults with diabetes: <7.0 ?  ? ?Hgb A1c MFr Bld  ?Date Value Ref Range Status  ?09/05/2021 8.0 (H) 4.8 - 5.6 % Final  ?  Comment:  ?  (NOTE) ?        Prediabetes: 5.7 - 6.4 ?        Diabetes: >6.4 ?        Glycemic control for adults with diabetes: <7.0 ?  ?   ?  ?  Passed - Valid encounter within last 6 months  ?  Recent Outpatient Visits   ?      ? 1 month ago Atrial fibrillation with RVR ([place])  ? [place], [place] T, NP  ? 2 months ago Sepsis due to pneumonia [facility])  ? [place], Cashion T, NP  ? 2 months ago Sepsis due to pneumonia [facility])  ? [place], Louann T, NP  ? 3 months ago COPD, moderate ([place])  ? [place], [place] T, NP  ? 4 months ago Type 2 diabetes, controlled, with neuropathy ([place])  ? [facility] [place], Henrine Screws T, NP  ?  ?  ?Future Appointments   ?        ? Tomorrow [name], NP [facility], [facility]  ? In 5 days  [facility], [facility]  ? In 4 months End, Harrell Gave, MD [facility], [facility]  ?  ? ?  ?  ?  ? ?

## 2021-12-11 NOTE — Anesthesia Preprocedure Evaluation (Signed)
Anesthesia Evaluation  ?Patient identified by MRN, date of birth, ID band ?Patient awake ? ? ? ?Reviewed: ?Allergy & Precautions, H&P , NPO status , reviewed documented beta blocker date and time  ? ?Airway ?Mallampati: III ? ?TM Distance: <3 FB ?Neck ROM: full ? ? ? Dental ? ?(+) Upper Dentures, Lower Dentures ?  ?Pulmonary ?shortness of breath, asthma , sleep apnea and Oxygen sleep apnea , pneumonia, resolved, COPD,  oxygen dependent, former smoker,  ?2L/min nightly ?  ? ?+ decreased breath sounds ? ? ? ? ? Cardiovascular ?Exercise Tolerance: Good ?hypertension, + Past MI, +CHF and + Orthopnea  ?+ dysrhythmias Atrial Fibrillation  ?Rhythm:Regular Rate:Normal ? ?1. Left ventricular ejection fraction, by estimation, is 40 to 45%. The  ?left ventricle has mildly decreased function. The left ventricle has no  ?regional wall motion abnormalities. Left ventricular diastolic parameters  ?are consistent with Grade I  ?diastolic dysfunction (impaired relaxation).  ??2. Right ventricular systolic function is normal. The right ventricular  ?size is normal. Tricuspid regurgitation signal is inadequate for assessing  ?PA pressure.  ??3. The mitral valve is normal in structure. No evidence of mitral valve  ?regurgitation. No evidence of mitral stenosis.  ??4. The aortic valve is normal in structure. Aortic valve regurgitation is  ?not visualized. No aortic stenosis is present.  ??5. The inferior vena cava is normal in size with greater than 50%  ?respiratory variability, suggesting right atrial pressure of 3 mmHg.  ?  ?Neuro/Psych ?PSYCHIATRIC DISORDERS Depression CVA, No Residual Symptoms   ? GI/Hepatic ?GERD  Medicated and Controlled,  ?Endo/Other  ?diabetes ? Renal/GU ?CRFRenal disease  ? ?  ?Musculoskeletal ? ?(+) Arthritis ,  ? Abdominal ?  ?Peds ? Hematology ? ?(+) Blood dyscrasia, anemia ,   ?Anesthesia Other Findings ?Past Medical History: ?No date: Arthritis ?No date: Asthma ?1998:  Breast cancer ([place]) ?    Comment:  right breast ca with mastectomy and chemotherapy and  ?             radiation ?No date: Bronchitis ?No date: CHF (congestive heart failure) ([place]) ?    Comment:  "with Morphine withdrawal" ?No date: COPD (chronic obstructive pulmonary disease) ([place]) ?No date: Diabetes mellitus without complication ([place]) ?No date: Diverticulitis ?    Comment:  diverticulosis also ?No date: Dyspnea ?No date: Endometriosis ?No date: GERD (gastroesophageal reflux disease) ?2000-2005: History of shingles ?No date: Hypercholesteremia ?No date: Hypertension ?No date: IBS (irritable bowel syndrome) ?No date: Low back pain ?    Comment:  a. Implanted morphine/bupivicaine/clonidine pump. ?No date: Neuropathy ?No date: Orthopnea ?No date: Oxygen dependent ?    Comment:  uses at night ?No date: Personal history of chemotherapy ?No date: Personal history of radiation therapy ?No date: Pneumonia ?    Comment:  pneumonia 5-6 times, history of bronchitis also ?No date: Scoliosis ?No date: Sleep apnea ?    Comment:  does not use cpap ?2010: Stroke [facility]) ?    Comment:  TIA, 10 years ago ?No date: Withdrawal from sedative drug ([place]) ?    Comment:  withdrawal from morphine when pump batteries died ? ?Past Surgical History: ?1987: ABDOMINAL HYSTERECTOMY ?No date: BACK SURGERY ?    Comment:  Tailbone removed following fracture ?No date: BREAST SURGERY; Right ?    Comment:  mastectomy ?No date: CARDIAC CATHETERIZATION ?05/19/2019: CATARACT EXTRACTION W/PHACO; Right ?    Comment:  Procedure: CATARACT EXTRACTION PHACO AND INTRAOCULAR  ?  LENS PLACEMENT (IOC), RIGHT, DIABETIC;  Surgeon: Neville Route,  ?             [name], MD;  Location: [facility] ORS;  Service: Ophthalmology;  ?             Laterality: Right;  Lot # [id number] ?[place]: 00:43.8 ?CDE:  ?             4.59 ?No date: COCCYX REMOVAL ?01/01/2017: COLONOSCOPY WITH PROPOFOL; N/A ?    Comment:  Procedure: COLONOSCOPY WITH PROPOFOL;  Surgeon: Vicente Males,  ?             Bailey Mech, MD;   Location: [place];  Service:  ?             Endoscopy;  Laterality: N/A; ?No date: ELBOW ARTHROSCOPY WITH TENDON RECONSTRUCTION ?01/01/2017: ESOPHAGOGASTRODUODENOSCOPY (EGD) WITH PROPOFOL; N/A ?    Comment:  Procedure: ESOPHAGOGASTRODUODENOSCOPY (EGD) WITH  ?             PROPOFOL;  Surgeon: Jonathon Bellows, MD;  Location: [facility]  ?             ENDOSCOPY;  Service: Endoscopy;  Laterality: N/A; ?No date: INTRATHECAL PUMP IMPLANT ?04/28/2017: LEFT HEART CATH AND CORONARY ANGIOGRAPHY; N/A ?    Comment:  Procedure: LEFT HEART CATH AND CORONARY ANGIOGRAPHY;   ?             Surgeon: [name], MD;  Location: [place]  ?             CV LAB;  Service: Cardiovascular;  Laterality: N/A; ?06/1997: MASTECTOMY; Right ?2011: morphine pump ?No date: PLANTAR FASCIA RELEASE ?No date: TRIGGER FINGER RELEASE ? ?BMI   ? Body Mass Index: 28.68 kg/m?  ?  ? ? Reproductive/Obstetrics ? ?  ? ? ? ? ? ? ? ? ? ? ? ? ? ?  ?  ? ? ? ? ? ? ? ? ?Anesthesia Physical ? ?Anesthesia Plan ? ?ASA: III ? ?Anesthesia Plan: General  ? ?Post-op Pain Management: Minimal or no pain anticipated  ? ?Induction: Intravenous ? ?PONV Risk Score and Plan: 3 and Treatment may vary due to age or medical condition and TIVA ? ?Airway Management Planned: Nasal Cannula and Natural Airway ? ?Additional Equipment: None ? ?Intra-op Plan:  ? ?Post-operative Plan:  ? ?Informed Consent: I have reviewed the patients History and Physical, chart, labs and discussed the procedure including the risks, benefits and alternatives for the proposed anesthesia with the patient or authorized representative who has indicated his/her understanding and acceptance.  ? ? ? ?Dental Advisory Given ? ?Plan Discussed with: CRNA ? ?Anesthesia Plan Comments: (Discussed risks of anesthesia with patient, including possibility of difficulty with spontaneous ventilation under anesthesia necessitating airway intervention, PONV, and rare risks such as cardiac or respiratory or neurological events,  and allergic reactions. Discussed the role of CRNA in patient's perioperative care. Patient understands.)  ? ? ? ? ? ? ?Anesthesia Quick Evaluation ? ?

## 2021-12-11 NOTE — Transfer of Care (Signed)
Immediate Anesthesia Transfer of Care Note ? ?Patient: PATCHES MCDONNELL ? ?Procedure(s) Performed: COLONOSCOPY WITH PROPOFOL ?ESOPHAGOGASTRODUODENOSCOPY (EGD) WITH PROPOFOL ? ?Patient Location: PACU and Endoscopy Unit ? ?Anesthesia Type:General ? ?Level of Consciousness: drowsy ? ?Airway & Oxygen Therapy: Patient Spontanous Breathing and Patient connected to nasal cannula oxygen ? ?Post-op Assessment: Report given to RN ? ?Post vital signs: stable ? ?Last Vitals:  ?Vitals Value Taken Time  ?BP 90/42 12/11/21 0821  ?Temp    ?Pulse 71 12/11/21 0822  ?Resp 14 12/11/21 0822  ?SpO2 100 % 12/11/21 0822  ?Vitals shown include unvalidated device data. ? ?Last Pain:  ?Vitals:  ? 12/11/21 0715  ?TempSrc: Temporal  ?PainSc: 5   ?   ? ?  ? ?Complications: No notable events documented. ?

## 2021-12-11 NOTE — Op Note (Signed)
[facility] ?Gastroenterology ?Patient Name: Jordan Chan ?Procedure Date: 12/11/2021 7:51 AM ?MRN: [id number] ?Account #: [id number] ?Date of Birth: Aug 23, 1950 ?Admit Type: Outpatient ?Age: 71 ?Room: [facility] ENDO ROOM 4 ?Gender: Female ?Note Status: Finalized ?Instrument Name: Peds Colonoscope [id number] ?Procedure:             Colonoscopy ?Indications:           Screening for colorectal malignant neoplasm, Last  ?                       colonoscopy: May 2018 ?Providers:             Lin Landsman MD, MD ?Medicines:             General Anesthesia ?Complications:         No immediate complications. Estimated blood loss: None. ?Procedure:             Pre-Anesthesia Assessment: ?                       - Prior to the procedure, a History and Physical was  ?                       performed, and patient medications and allergies were  ?                       reviewed. The patient is competent. The risks and  ?                       benefits of the procedure and the sedation options and  ?                       risks were discussed with the patient. All questions  ?                       were answered and informed consent was obtained.  ?                       Patient identification and proposed procedure were  ?                       verified by the physician, the nurse, the  ?                       anesthesiologist, the anesthetist and the technician  ?                       in the pre-procedure area in the procedure room in the  ?                       endoscopy suite. Mental Status Examination: alert and  ?                       oriented. Airway Examination: normal oropharyngeal  ?                       airway and neck mobility. Respiratory Examination:  ?                       clear to auscultation. CV  Examination: normal.  ?                       Prophylactic Antibiotics: The patient does not require  ?                       prophylactic antibiotics. Prior Anticoagulants: The  ?                        patient has taken Eliquis (apixaban), last dose was 3  ?                       days prior to procedure. ASA Grade Assessment: III - A  ?                       patient with severe systemic disease. After reviewing  ?                       the risks and benefits, the patient was deemed in  ?                       satisfactory condition to undergo the procedure. The  ?                       anesthesia plan was to use general anesthesia.  ?                       Immediately prior to administration of medications,  ?                       the patient was re-assessed for adequacy to receive  ?                       sedatives. The heart rate, respiratory rate, oxygen  ?                       saturations, blood pressure, adequacy of pulmonary  ?                       ventilation, and response to care were monitored  ?                       throughout the procedure. The physical status of the  ?                       patient was re-assessed after the procedure. ?                       After obtaining informed consent, the colonoscope was  ?                       passed under direct vision. Throughout the procedure,  ?                       the patient's blood pressure, pulse, and oxygen  ?                       saturations were monitored continuously. The  ?  Colonoscope was introduced through the anus and  ?                       advanced to the the cecum, identified by appendiceal  ?                       orifice and ileocecal valve. The colonoscopy was  ?                       performed without difficulty. The patient tolerated  ?                       the procedure well. The quality of the bowel  ?                       preparation was poor. ?Findings: ?     The perianal and digital rectal examinations were normal. Pertinent  ?     negatives include normal sphincter tone and no palpable rectal lesions. ?     Copious quantities of semi-liquid semi-solid stool was found in the  ?     entire colon,  precluding visualization. ?     The retroflexed view of the distal rectum and anal verge was normal and  ?     showed no anal or rectal abnormalities. ?Impression:            - Preparation of the colon was poor. ?                       - Stool in the entire examined colon. ?                       - The distal rectum and anal verge are normal on  ?                       retroflexion view. ?                       - No specimens collected. ?Recommendation:        - Discharge patient to home (with escort). ?                       - Resume previous diet today. ?                       - Resume Eliquis (apixaban) today at prior dose. Refer  ?                       to managing physician for further adjustment of  ?                       therapy. ?                       - Repeat colonoscopy in 6 months with 2 day prep  ?                       because the bowel preparation was suboptimal. ?Procedure Code(s):     --- Professional --- ?  R3736, Colorectal cancer screening; colonoscopy on  ?                       individual not meeting criteria for high risk ?Diagnosis Code(s):     --- Professional --- ?                       Z12.11, Encounter for screening for malignant neoplasm  ?                       of colon ?CPT copyright 2019 American Medical Association. All rights reserved. ?The codes documented in this report are preliminary and upon coder review may  ?be revised to meet current compliance requirements. ?Dr. Ulyess Mort ?Maddisen Vought Raeanne Gathers MD, MD ?12/11/2021 8:20:27 AM ?This report has been signed electronically. ?Number of Addenda: 0 ?Note Initiated On: 12/11/2021 7:51 AM ?Scope Withdrawal Time: 0 hours 3 minutes 9 seconds  ?Total Procedure Duration: 0 hours 7 minutes 15 seconds  ?Estimated Blood Loss:  Estimated blood loss: none. ?     [facility] ?

## 2021-12-11 NOTE — H&P (Signed)
 Corinn JONELLE Brooklyn, MD [address]  [address]  [place], [place] [address]  Main: [phone]  Fax: [phone] Pager: [phone]  Primary Care Physician:  [name], NP Primary Gastroenterologist:  Dr. Corinn JONELLE Brooklyn  Pre-Procedure History & Physical: HPI:  Jordan Chan is a 71 y.o. female is here for an endoscopy and colonoscopy.   Past Medical History:  Diagnosis Date   Arthritis    Asthma    Atrial fibrillation (HCC)    Breast cancer (HCC) 1998   right breast ca with mastectomy and chemotherapy and radiation   Bronchitis    CHF (congestive heart failure) (HCC)    with Morphine  withdrawal   COPD (chronic obstructive pulmonary disease) (HCC)    Diabetes mellitus without complication (HCC)    Diverticulitis    diverticulosis also   Dyspnea    Endometriosis    GERD (gastroesophageal reflux disease)    History of shingles 2000-2005   Hypercholesteremia    Hypertension    IBS (irritable bowel syndrome)    Low back pain    a. Implanted morphine /bupivicaine/clonidine  pump.   Neuropathy    Orthopnea    Oxygen  dependent    uses at night   Personal history of chemotherapy    Personal history of radiation therapy    Pneumonia    pneumonia 5-6 times, history of bronchitis also   Scoliosis    Sleep apnea    does not use cpap   Stroke (HCC) 2010   TIA, 10 years ago   Withdrawal from sedative drug (HCC)    withdrawal from morphine  when pump batteries died    Past Surgical History:  Procedure Laterality Date   ABDOMINAL HYSTERECTOMY  1987   BACK SURGERY     Tailbone removed following fracture   BREAST SURGERY Right    mastectomy   CARDIAC CATHETERIZATION     CATARACT EXTRACTION W/PHACO Right 05/19/2019   Procedure: CATARACT EXTRACTION PHACO AND INTRAOCULAR LENS PLACEMENT (IOC), RIGHT, DIABETIC;  Surgeon: Ferol Rogue, MD;  Location: [facility] ORS;  Service: Ophthalmology;  Laterality: Right;  Lot # [id number] H US : 00:43.8 CDE: 4.59   CATARACT  EXTRACTION W/PHACO Left 06/16/2019   Procedure: CATARACT EXTRACTION PHACO AND INTRAOCULAR LENS PLACEMENT (IOC) LEFT VISION BLUE DIABETIC;  Surgeon: Ferol Rogue, MD;  Location: [facility] ORS;  Service: Ophthalmology;  Laterality: Left;  Lot #[id number] H US : 00:46.9 CDE: 6.53   COCCYX REMOVAL     COLONOSCOPY WITH PROPOFOL  N/A 01/01/2017   Procedure: COLONOSCOPY WITH PROPOFOL ;  Surgeon: Therisa Bi, MD;  Location: [facility] ENDOSCOPY;  Service: Endoscopy;  Laterality: N/A;   ELBOW ARTHROSCOPY WITH TENDON RECONSTRUCTION     ESOPHAGOGASTRODUODENOSCOPY (EGD) WITH PROPOFOL  N/A 01/01/2017   Procedure: ESOPHAGOGASTRODUODENOSCOPY (EGD) WITH PROPOFOL ;  Surgeon: Therisa Bi, MD;  Location: [facility] ENDOSCOPY;  Service: Endoscopy;  Laterality: N/A;   INTRATHECAL PUMP IMPLANT     LEFT HEART CATH AND CORONARY ANGIOGRAPHY N/A 04/28/2017   Procedure: LEFT HEART CATH AND CORONARY ANGIOGRAPHY;  Surgeon: Mady Bruckner, MD;  Location: [facility] INVASIVE CV LAB;  Service: Cardiovascular;  Laterality: N/A;   MASTECTOMY Right 06/1997   morphine  pump  2011   PLANTAR FASCIA RELEASE     TRIGGER FINGER RELEASE      Prior to Admission medications   Medication Sig Start Date End Date Taking? Authorizing Provider  ACCU-CHEK AVIVA PLUS test strip TEST THREE TIMES DAILY 05/11/21  Yes [name], Jolene T, NP  Accu-Chek Softclix Lancets lancets TEST BLOOD SUGAR THREE TIMES DAILY 10/01/21  Yes  [name], Jolene T, NP  albuterol  (PROVENTIL  HFA;VENTOLIN  HFA) 108 (90 BASE) MCG/ACT inhaler Inhale 2 puffs into the lungs every 6 (six) hours as needed for wheezing or shortness of breath.    Yes [provider]  Alcohol  Swabs (DROPSAFE ALCOHOL  PREP) 70 % PADS USE TWICE DAILY  WITH  SUGAR  CHECKS 11/14/21  Yes [name], Jolene T, NP  apixaban  (ELIQUIS ) 5 MG TABS tablet Take 1 tablet (5 mg total) by mouth 2 (two) times daily. 09/14/21  Yes Patel, Sona, MD  atorvastatin  (LIPITOR) 20 MG tablet Take 1 tablet (20 mg total) by mouth daily. 04/29/21  Yes  [name], Jolene T, NP  Blood Glucose Monitoring Suppl (TRUE METRIX METER) w/Device KIT Use to check blood sugar 4 times a day 05/14/21  Yes [name], Jolene T, NP  cholecalciferol  (VITAMIN D3) 25 MCG (1000 UNIT) tablet Take 1,000 Units by mouth daily.   Yes [provider]  Cyanocobalamin  1000 MCG/ML KIT Inject 1,000 mcg as directed every 30 (thirty) days.   Yes [provider]  cyclobenzaprine  (FLEXERIL ) 5 MG tablet Take 5 mg by mouth 2 (two) times daily as needed for muscle spasms. Takes very rarely   Yes [provider]  diclofenac  (VOLTAREN ) 50 MG EC tablet TAKE 1 TABLET THREE TIMES DAILY 08/30/21  Yes [name], Jolene T, NP  Dulaglutide  (TRULICITY ) 4.5 MG/0.5ML SOPN Inject 4.5 mg as directed once a week. 07/20/21  Yes [name], Jolene T, NP  folic acid  (FOLVITE ) 1 MG tablet TAKE 1 TABLET EVERY DAY 10/14/21  Yes Allen, Lauren G, NP  furosemide  (LASIX ) 40 MG tablet TAKE 1 TABLET EVERY DAY 07/15/21  Yes End, Lonni, MD  gabapentin  (NEURONTIN ) 800 MG tablet Take 1 tablet (800 mg total) by mouth 3 (three) times daily. 10/25/20  Yes [name], Jolene T, NP  insulin  degludec (TRESIBA  FLEXTOUCH) 100 UNIT/ML FlexTouch Pen Inject 50 Units into the skin at bedtime.   Yes [provider]  losartan  (COZAAR ) 25 MG tablet Take 0.5 tablets (12.5 mg total) by mouth daily. 09/16/21 12/15/21 Yes Dunn, Bernardino HERO, PA-C  metFORMIN  (GLUCOPHAGE ) 1000 MG tablet TAKE 1 TABLET TWICE DAILY WITH MEALS 12/04/21  Yes [name], Jolene T, NP  metoprolol  succinate (TOPROL -XL) 25 MG 24 hr tablet TAKE 1/2 TABLET EVERY DAY 05/13/21  Yes Dunn, Bernardino HERO, PA-C  montelukast  (SINGULAIR ) 10 MG tablet Take 10 mg by mouth at bedtime.   Yes [provider]  ondansetron  (ZOFRAN ) 4 MG tablet Take 1 tablet (4 mg total) by mouth every 8 (eight) hours as needed for nausea or vomiting. 09/17/21  Yes [name], Jolene T, NP  PAIN MANAGEMENT INTRATHECAL, IT, PUMP 1 each by Intrathecal route. Intrathecal (IT) medication:   Morphine  Patient does not remember current. Adjusted every 2.5 months at [facility] in [place].   Yes [provider]  pantoprazole  (PROTONIX ) 40 MG tablet Take 1 tablet (40 mg total) by mouth daily. 09/15/21  Yes Patel, Sona, MD  TRELEGY ELLIPTA  100-62.5-25 MCG/ACT AEPB INHALE 1 PUFF INTO THE LUNGS DAILY 12/04/21  Yes [name], Jolene T, NP  venlafaxine  XR (EFFEXOR -XR) 150 MG 24 hr capsule Take 1 capsule (150 mg total) by mouth daily. 10/22/20  Yes [name], Jolene T, NP    Allergies as of 11/18/2021 - Review Complete 11/06/2021  Allergen Reaction Noted   Other Palpitations 03/19/2015   Pain patch [menthol] Anaphylaxis 03/19/2015   Avelox  [moxifloxacin  hcl in nacl] Other (See Comments) 06/18/2015   Doxycycline  Diarrhea 06/12/2017   Erythromycin  Nausea Only and  Other (See Comments) 01/13/2014   Fentanyl  Nausea Only and Rash 06/18/2015   Moxifloxacin  hcl Other (See Comments) 06/18/2015   Oxycontin [oxycodone] Hives 10/01/2015   Ozempic [semaglutide] Nausea Only 02/05/2018    Family History  Problem Relation Age of Onset   Cancer Mother 21       lung   Thyroid  disease Mother    Cancer Father 23       Pancreatic   Hypertension Sister    Cancer Sister        cancer on face   Hyperlipidemia Sister    Osteoporosis Maternal Grandmother    Hyperlipidemia Son    Seizures Son    Cancer Paternal Grandmother        colon   Arthritis Paternal Grandfather    Breast cancer Neg Hx     Social History   Socioeconomic History   Marital status: Unknown    Spouse name: Not on file   Number of children: Not on file   Years of education: Not on file   Highest education level: High school graduate  Occupational History   Occupation: retired  Tobacco Use   Smoking status: Former    Packs/day: 1.00    Years: 30.00    Pack years: 30.00    Types: Cigarettes    Quit date: 04/30/2004    Years since quitting: 17.6   Smokeless tobacco: Never  Vaping Use   Vaping  Use: Never used  Substance and Sexual Activity   Alcohol  use: No   Drug use: No   Sexual activity: Not Currently    Birth control/protection: Post-menopausal  Other Topics Concern   Not on file  Social History Narrative   ** Merged History Encounter **       Social Determinants of Health   Financial Resource Strain: Low Risk    Difficulty of Paying Living Expenses: Not hard at all  Food Insecurity: No Food Insecurity   Worried About Programme researcher, broadcasting/film/video in the Last Year: Never true   Ran Out of Food in the Last Year: Never true  Transportation Needs: No Transportation Needs   Lack of Transportation (Medical): No   Lack of Transportation (Non-Medical): No  Physical Activity: Inactive   Days of Exercise per Week: 0 days   Minutes of Exercise per Session: 0 min  Stress: No Stress Concern Present   Feeling of Stress : Not at all  Social Connections: Not on file  Intimate Partner Violence: Not on file    Review of Systems: See HPI, otherwise negative ROS  Physical Exam: BP 127/64   Pulse 78   Temp (!) 96.1 F (35.6 C) (Temporal)   Resp 20   Ht 4' 10.5 (1.486 m)   Wt 67.1 kg   LMP  (LMP Unknown)   SpO2 96%   BMI 30.41 kg/m  General:   Alert,  pleasant and cooperative in NAD Head:  Normocephalic and atraumatic. Neck:  Supple; no masses or thyromegaly. Lungs:  Clear throughout to auscultation.    Heart:  Regular rate and rhythm. Abdomen:  Soft, nontender and nondistended. Normal bowel sounds, without guarding, and without rebound.   Neurologic:  Alert and  oriented x4;  grossly normal neurologically.  Impression/Plan: Jordan Chan is here for an endoscopy and colonoscopy to be performed for solid food dysphagia, colon cancer screening  Risks, benefits, limitations, and alternatives regarding  endoscopy and colonoscopy have been reviewed with the patient.  Questions have been answered.  All parties  agreeable.   Corinn Brooklyn, MD  12/11/2021, 7:29 AM

## 2021-12-11 NOTE — Anesthesia Postprocedure Evaluation (Signed)
Anesthesia Post Note ? ?Patient: Jordan Chan ? ?Procedure(s) Performed: COLONOSCOPY WITH PROPOFOL ?ESOPHAGOGASTRODUODENOSCOPY (EGD) WITH PROPOFOL ? ?Patient location during evaluation: Endoscopy ?Anesthesia Type: General ?Level of consciousness: awake and alert ?Pain management: pain level controlled ?Vital Signs Assessment: post-procedure vital signs reviewed and stable ?Respiratory status: spontaneous breathing, nonlabored ventilation, respiratory function stable and patient connected to nasal cannula oxygen ?Cardiovascular status: blood pressure returned to baseline and stable ?Postop Assessment: no apparent nausea or vomiting ?Anesthetic complications: no ? ?No notable events documented. ? ? ?Last Vitals:  ?Vitals:  ? 12/11/21 0715 12/11/21 0820  ?BP: 127/64 (!) 90/42  ?Pulse: 78 72  ?Resp: 20 16  ?Temp: (!) 35.6 ?C (!) 36.3 ?C  ?SpO2: 96% 100%  ?  ?Last Pain:  ?Vitals:  ? 12/11/21 0820  ?TempSrc: Temporal  ?PainSc: 0-No pain  ? ? ?  ?  ?  ?  ?  ?  ? ?Arita Miss ? ? ? ? ?

## 2021-12-11 NOTE — Telephone Encounter (Signed)
Medication Refill - Medication: Dulaglutide (TRULICITY) 4.5 RA/7.4AD SOPN  ? ?Has the patient contacted their pharmacy? Yes.   ? ?(Agent: If yes, when and what did the pharmacy advise?) new Rx  ? ?Preferred Pharmacy (with phone number or street name):  ?[facility] [place] Phone:  [phone]  ?Fax:  [phone]  ?  ? ?Has the patient been seen for an appointment in the last year OR does the patient have an upcoming appointment? Yes.   ? ?Agent: Please be advised that RX refills may take up to 3 business days. We ask that you follow-up with your pharmacy. ? ?

## 2021-12-12 ENCOUNTER — Ambulatory Visit (INDEPENDENT_AMBULATORY_CARE_PROVIDER_SITE_OTHER): Payer: Medicare HMO | Admitting: Nurse Practitioner

## 2021-12-12 ENCOUNTER — Encounter: Payer: Self-pay | Admitting: Nurse Practitioner

## 2021-12-12 VITALS — BP 101/61 | HR 81 | Temp 98.1°F | Ht 58.5 in | Wt 147.8 lb

## 2021-12-12 DIAGNOSIS — I4891 Unspecified atrial fibrillation: Secondary | ICD-10-CM

## 2021-12-12 DIAGNOSIS — Z6830 Body mass index (BMI) 30.0-30.9, adult: Secondary | ICD-10-CM

## 2021-12-12 DIAGNOSIS — E119 Type 2 diabetes mellitus without complications: Secondary | ICD-10-CM | POA: Diagnosis not present

## 2021-12-12 DIAGNOSIS — I5022 Chronic systolic (congestive) heart failure: Secondary | ICD-10-CM | POA: Diagnosis not present

## 2021-12-12 DIAGNOSIS — E785 Hyperlipidemia, unspecified: Secondary | ICD-10-CM

## 2021-12-12 DIAGNOSIS — E6609 Other obesity due to excess calories: Secondary | ICD-10-CM

## 2021-12-12 DIAGNOSIS — E538 Deficiency of other specified B group vitamins: Secondary | ICD-10-CM | POA: Diagnosis not present

## 2021-12-12 DIAGNOSIS — J449 Chronic obstructive pulmonary disease, unspecified: Secondary | ICD-10-CM | POA: Diagnosis not present

## 2021-12-12 DIAGNOSIS — I152 Hypertension secondary to endocrine disorders: Secondary | ICD-10-CM | POA: Diagnosis not present

## 2021-12-12 DIAGNOSIS — Z978 Presence of other specified devices: Secondary | ICD-10-CM

## 2021-12-12 DIAGNOSIS — E1159 Type 2 diabetes mellitus with other circulatory complications: Secondary | ICD-10-CM

## 2021-12-12 DIAGNOSIS — F324 Major depressive disorder, single episode, in partial remission: Secondary | ICD-10-CM | POA: Diagnosis not present

## 2021-12-12 DIAGNOSIS — K219 Gastro-esophageal reflux disease without esophagitis: Secondary | ICD-10-CM

## 2021-12-12 DIAGNOSIS — G4733 Obstructive sleep apnea (adult) (pediatric): Secondary | ICD-10-CM

## 2021-12-12 DIAGNOSIS — Z794 Long term (current) use of insulin: Secondary | ICD-10-CM | POA: Diagnosis not present

## 2021-12-12 DIAGNOSIS — I700 Atherosclerosis of aorta: Secondary | ICD-10-CM

## 2021-12-12 DIAGNOSIS — E66811 Obesity, class 1: Secondary | ICD-10-CM

## 2021-12-12 DIAGNOSIS — E1140 Type 2 diabetes mellitus with diabetic neuropathy, unspecified: Secondary | ICD-10-CM | POA: Diagnosis not present

## 2021-12-12 DIAGNOSIS — E1169 Type 2 diabetes mellitus with other specified complication: Secondary | ICD-10-CM | POA: Diagnosis not present

## 2021-12-12 DIAGNOSIS — R7989 Other specified abnormal findings of blood chemistry: Secondary | ICD-10-CM

## 2021-12-12 DIAGNOSIS — G894 Chronic pain syndrome: Secondary | ICD-10-CM

## 2021-12-12 DIAGNOSIS — N1831 Chronic kidney disease, stage 3a: Secondary | ICD-10-CM

## 2021-12-12 DIAGNOSIS — D508 Other iron deficiency anemias: Secondary | ICD-10-CM

## 2021-12-12 LAB — MICROALBUMIN, URINE WAIVED
Creatinine, Urine Waived: 200 mg/dL (ref 10–300)
Microalb, Ur Waived: 80 mg/L — ABNORMAL HIGH (ref 0–19)

## 2021-12-12 LAB — SURGICAL PATHOLOGY

## 2021-12-12 LAB — BAYER DCA HB A1C WAIVED: HB A1C (BAYER DCA - WAIVED): 7.6 % — ABNORMAL HIGH (ref 4.8–5.6)

## 2021-12-12 MED ORDER — TIRZEPATIDE 2.5 MG/0.5ML ~~LOC~~ SOAJ: SUBCUTANEOUS | 0 refills | Status: DC

## 2021-12-12 MED ORDER — FUROSEMIDE 40 MG PO TABS: 40.0000 mg | ORAL_TABLET | Freq: Every day | ORAL | 4 refills | Status: DC

## 2021-12-12 MED ORDER — PANTOPRAZOLE SODIUM 40 MG PO TBEC: 40.0000 mg | DELAYED_RELEASE_TABLET | Freq: Every day | ORAL | 4 refills | Status: DC

## 2021-12-12 MED ORDER — VENLAFAXINE HCL ER 150 MG PO CP24: 150.0000 mg | ORAL_CAPSULE | Freq: Every day | ORAL | 4 refills | Status: DC

## 2021-12-12 MED ORDER — GABAPENTIN 800 MG PO TABS: 800.0000 mg | ORAL_TABLET | Freq: Three times a day (TID) | ORAL | 4 refills | Status: DC

## 2021-12-12 MED ORDER — CYANOCOBALAMIN 1000 MCG/ML IJ SOLN
1000.0000 ug | Freq: Once | INTRAMUSCULAR | Status: AC
  Administered 2021-12-12: 1000 ug via INTRAMUSCULAR

## 2021-12-12 MED ORDER — METOPROLOL SUCCINATE ER 25 MG PO TB24: 12.5000 mg | ORAL_TABLET | Freq: Every day | ORAL | 4 refills | Status: DC

## 2021-12-12 MED ORDER — TIRZEPATIDE 5 MG/0.5ML ~~LOC~~ SOAJ: 5.0000 mg | SUBCUTANEOUS | 0 refills | Status: DC

## 2021-12-12 NOTE — Assessment & Plan Note (Signed)
Poor tolerance of CPAP mask, uses O2 night 2.5 L [place]. ?

## 2021-12-12 NOTE — Assessment & Plan Note (Signed)
Ongoing and stable.  Continue Losartan at low dose for heart and kidney protection.  Check CMP and urine ALB today.  Will refer to nephrology if any worsening in future. 

## 2021-12-12 NOTE — Assessment & Plan Note (Signed)
Chronic, stable.  Continue current medication regimen and adjust as needed.  Lipid panel today. 

## 2021-12-12 NOTE — Assessment & Plan Note (Signed)
Chronic, ongoing, insulin dependent. A1c 7.6% today, downward trend at this time from 8%.  Foot exam performed today.  Urine ALB ordered. ?- Will continue Metformin at max dosing and Tresiba to 60 units.  Will stop Trulicity and start Mounjaro, educated her on this, as it may offer her more benefit to A1c control and weight.  Script sent for 2.5 MG weekly x 4 weeks and then increase to 5 MG weekly.  We may reduce insulin dependent on sugars with this change. ?- Recommend she continue to monitor BS consistently at home and document + focus heavily on diet changes.  She is aware to notify provider if fasting BS >130 consistently or <70, as may need to adjust insulin further.   ?- In past poorly tolerated Jardiance -- discussed with her that and how SGLT2 would benefit HF -- however concern for side effects.   ?- Would benefit a [employer], may need to work on coverage for this.  ?- Return in 3 months. ? ?

## 2021-12-12 NOTE — Assessment & Plan Note (Signed)
BMI 30.36.  Recommended eating smaller high protein, low fat meals more frequently and exercising 30 mins a day 5 times a week with a goal of 10-15lb weight loss in the next 3 months. Patient voiced their understanding and motivation to adhere to these recommendations. ? ?

## 2021-12-12 NOTE — Assessment & Plan Note (Signed)
Ongoing and stable since infusions.  Will continue to collaborate with hematology as needed, recent notes and labs reviewed.  Will check CBC every 6 months, next 04/25/22. ?

## 2021-12-12 NOTE — Assessment & Plan Note (Signed)
Chronic, stable.  Denies SI/HI.  Mood well-controlled.  Continue current medication regimen and adjust as needed.  Refills as needed.  Consider trial of Duloxetine and transition off Effexor in future for dual pain and mood benefit.   

## 2021-12-12 NOTE — Assessment & Plan Note (Signed)
Chronic, ongoing.  Continue Trelegy which offers benefit of medication minimization and has benefited symptoms.  Continue to collaborate with [name], recent notes reviewed.  No current daytime O2, at night only.   ?

## 2021-12-12 NOTE — Assessment & Plan Note (Signed)
Chronic, ongoing, insulin dependent. A1c 7.6% today, downward trend at this time from 8%.  Foot exam performed today.  Urine ALB ordered. ?- Will continue Metformin at max dosing and Tresiba to 60 units.  Will stop Trulicity and start Mounjaro, educated her on this, as it may offer her more benefit to A1c control and weight.  Script sent for 2.5 MG weekly x 4 weeks and then increase to 5 MG weekly.  We may reduce insulin dependent on sugars with this change. ?- Continue Gabapentin for neuropathy, refills sent. ?- Recommend she continue to monitor BS consistently at home and document + focus heavily on diet changes.  She is aware to notify provider if fasting BS >130 consistently or <70, as may need to adjust insulin further.   ?- In past poorly tolerated Jardiance -- discussed with her that and how SGLT2 would benefit HF -- however concern for side effects.   ?- Would benefit a [employer], may need to work on coverage for this.  ?- Return in 3 months. ? ?

## 2021-12-12 NOTE — Assessment & Plan Note (Signed)
Chronic, stable with BP at goal today.  Recommend she continue to monitor BP at home regularly.  Focus on DASH diet.  Continue current medication regimen and adjust as needed, refills sent as needed.  CMP today.  Return in 3 months. ? ?

## 2021-12-12 NOTE — Assessment & Plan Note (Signed)
Noted on CT 12/11/19.  Continue daily statin.  Monitor closely for symptoms. ?

## 2021-12-12 NOTE — Assessment & Plan Note (Signed)
Continue to collaborate with chronic pain clinic in [place].  Has morphine pump.   

## 2021-12-12 NOTE — Assessment & Plan Note (Signed)
Chronic, ongoing.  Recent testing by GI performed.  Needs repeat colonoscopy in 6 months as was not clear.  Continue Protonix daily and adjust as needed.  Plan on Mag level annually. ?

## 2021-12-12 NOTE — Assessment & Plan Note (Signed)
Chronic, stable.  B12 level up to date, continue injections monthly. ?

## 2021-12-12 NOTE — Assessment & Plan Note (Signed)
Continue to collaborate with pain clinic in [place] who monitors this. 

## 2021-12-12 NOTE — Progress Notes (Signed)
 BP 101/61   Pulse 81   Temp 98.1 F (36.7 C) (Oral)   Ht 4' 10.5 (1.486 m)   Wt 147 lb 12.8 oz (67 kg)   LMP  (LMP Unknown)   SpO2 95%   BMI 30.36 kg/m    Subjective:    [name], female    DOB: 1951/06/23, 71 y.o.   MRN: [id number]  HPI: Jordan Chan is a 71 y.o. female  Chief Complaint  Patient presents with   Diabetes   Hyperlipidemia   Hypertension   COPD   Anemia   DIABETES Continues on Metformin  1000 MG BID, Trulicity  4.5 MG weekly, and Tresiba  50 units at HS.  Works with [employer] team and provider. Last A1c was 8% in February.  She has tried Jardiance  in past and this made her sick.    Continues on monthly B12 shots for low levels.  Followed by hematology in past and last saw 01/22/21, received 5 iron  infusions total -- continues on folic acid .  WBC remains stable and if ever becomes elevated and continues to elevate then they will do bone marrow biopsy. Had colonoscopy yesterday, but was not prepped enough + did endoscopy with 2 biopsies.   Hypoglycemic episodes: none Polydipsia/polyuria: no Visual disturbance: no Chest pain: no Paresthesias: no Glucose Monitoring: yes  Accucheck frequency: BID  Fasting glucose: 110 to 150 -- 140 something this morning  Post prandial: 180 to 200  Evening: 168 last night  Before meals: Taking Insulin ?: yes  Long acting insulin : Tresiba  50 units  Short acting insulin : Blood Pressure Monitoring: occasional Retinal Examination: Up to Date Foot Exam: Up to Date Pneumovax: Up to Date Influenza: Up to Date Aspirin : yes   HYPERTENSION / HYPERLIPIDEMIA/CHF Continues on Atorvastatin  20 MG, Metoprolol  12.5 MG daily, Lasix  40 MG daily, Losartan  25 MG daily, + ASA.  Currently on Eliquis .  Most recent EF 10/21/21 showed EF 40-45% with mildly decreased LV function.  Last saw cardiology on 11/06/21. Satisfied with current treatment? yes Duration of hypertension: chronic BP monitoring frequency: a few times a  week BP range:  average 110-120/60 BP medication side effects: no Duration of hyperlipidemia: chronic Cholesterol medication side effects: no Cholesterol supplements: none Medication compliance: good compliance Aspirin : yes Recent stressors: no Recurrent headaches: no Visual changes: no Palpitations: no Dyspnea: baseline, no worsening Chest pain: no Lower extremity edema: no Dizzy/lightheaded: no   ATRIAL FIBRILLATION Atrial fibrillation status: stable Satisfied with current treatment: yes  Medication side effects:  no Medication compliance: good compliance Etiology of atrial fibrillation: unknown Palpitations:  no Chest pain:  no Dyspnea on exertion:  no Orthopnea:  no Syncope:  no Edema:  no Ventricular rate control: B-blocker Anti-coagulation: long acting   COPD Followed by pulmonary for COPD -- last saw 11/06/21.  Currently using Trelegy, Albuterol , and Singulair .  Had pneumonia 09/11/21, which on repeat imaging on 09/26/21 showing clearance.  Has a history of smoking, quit 15 years ago. She does not use CPAP, but continues to use O2 at night.   COPD status: controlled Satisfied with current treatment?: yes Oxygen  use: no Dyspnea frequency: none Cough frequency: none Rescue inhaler frequency: very seldom Limitation of activity: no Productive cough: none Last Spirometry: with pulmonary Pneumovax: Up to Date Influenza: Up to Date  GERD Continues on Protonix . GERD control status: stable Satisfied with current treatment? yes Heartburn frequency: none Medication side effects: no  Medication compliance: fluctuating Previous GERD medications: Protonix  Antacid use frequency:  none Dysphagia:  ongoing issues followed by GI Odynophagia:  no Hematemesis: no Blood in stool: no EGD: yes   CHRONIC PAIN: Followed by [facility] in [place], last seen 12/04/21 -- continues pump + Gabapentin .  Followed by clinic nurse for pump check and replacement. Reports pain is  well-controlled with current regimen -- at baseline for her.    Vitamin D  on low side in past, taking supplement with recent levels stable.  No recent falls or fractures.  Normal DEXA 2017.  DEPRESSION Continues on Effexor  150 MG daily. Mood status: controlled Satisfied with current treatment?: yes Symptom severity: mild  Duration of current treatment : chronic Side effects: no Medication compliance: good compliance Psychotherapy/counseling: has gone in the past Depressed mood: yes Anxious mood: no Anhedonia: no Significant weight loss or gain: no Insomnia: none Fatigue: no Feelings of worthlessness or guilt: no Impaired concentration/indecisiveness: no Suicidal ideations: no Hopelessness: no Crying spells: no    12/12/2021   10:23 AM 10/23/2021   10:49 AM 09/26/2021    9:56 AM 09/17/2021    9:00 AM 08/14/2021   11:08 AM  Depression screen PHQ 2/9  Decreased Interest 0 0 2 3 0  Down, Depressed, Hopeless 1 1 2 2 2   PHQ - 2 Score 1 1 4 5 2   Altered sleeping 3 3 3 3 3   Tired, decreased energy 3 3 3 3 2   Change in appetite 2 2 3 3 2   Feeling bad or failure about yourself  0 0 0 0 0  Trouble concentrating 0 0 1 2 0  Moving slowly or fidgety/restless 0 0 0 0 0  Suicidal thoughts 0 0 0 0 0  PHQ-9 Score 9 9 14 16 9   Difficult doing work/chores    Somewhat difficult       12/12/2021   10:23 AM 10/23/2021   10:49 AM 09/26/2021    9:56 AM 09/17/2021    9:01 AM  GAD 7 : Generalized Anxiety Score  Nervous, Anxious, on Edge 1 1 2 2   Control/stop worrying 1 0 2 2  Worry too much - different things 1 0 2 2  Trouble relaxing 1 2 2 2   Restless 0 0 0 1  Easily annoyed or irritable 0 0 1 0  Afraid - awful might happen 0 0 1 0  Total GAD 7 Score 4 3 10 9   Anxiety Difficulty Not difficult at all Not difficult at all Somewhat difficult Somewhat difficult     Relevant past medical, surgical, family and social history reviewed and updated as indicated. Interim medical history since our  last visit reviewed. Allergies and medications reviewed and updated.  Review of Systems  Constitutional:  Negative for activity change, appetite change, diaphoresis, fatigue and fever.  Respiratory:  Negative for cough, chest tightness and shortness of breath.   Cardiovascular:  Negative for chest pain, palpitations and leg swelling.  Gastrointestinal: Negative.   Endocrine: Negative for heat intolerance, polydipsia, polyphagia and polyuria.  Neurological: Negative.   Psychiatric/Behavioral: Negative.     Per HPI unless specifically indicated above     Objective:    BP 101/61   Pulse 81   Temp 98.1 F (36.7 C) (Oral)   Ht 4' 10.5 (1.486 m)   Wt 147 lb 12.8 oz (67 kg)   LMP  (LMP Unknown)   SpO2 95%   BMI 30.36 kg/m   Wt Readings from Last 3 Encounters:  12/12/21 147 lb 12.8 oz (67 kg)  12/11/21 148 lb (67.1 kg)  11/06/21 143 lb (64.9 kg)    Physical Exam Vitals and nursing note reviewed.  Constitutional:      General: She is awake. She is not in acute distress.    Appearance: She is well-developed and well-groomed. She is not ill-appearing.  HENT:     Head: Normocephalic.     Right Ear: Hearing normal.     Left Ear: Hearing normal.  Eyes:     General: Lids are normal.        Right eye: No discharge.        Left eye: No discharge.     Conjunctiva/sclera: Conjunctivae normal.     Pupils: Pupils are equal, round, and reactive to light.  Neck:     Thyroid : No thyromegaly.     Vascular: No carotid bruit.  Cardiovascular:     Rate and Rhythm: Normal rate and regular rhythm.     Heart sounds: Normal heart sounds. No murmur heard.   No gallop.  Pulmonary:     Effort: Pulmonary effort is normal. No accessory muscle usage or respiratory distress.     Breath sounds: Normal breath sounds. No wheezing or rhonchi.  Abdominal:     General: Bowel sounds are normal.     Palpations: Abdomen is soft.  Musculoskeletal:     Cervical back: Normal range of motion and neck  supple.     Right lower leg: No edema.     Left lower leg: No edema.  Lymphadenopathy:     Cervical: No cervical adenopathy.  Skin:    General: Skin is warm and dry.  Neurological:     Mental Status: She is alert and oriented to person, place, and time.  Psychiatric:        Attention and Perception: Attention normal.        Mood and Affect: Mood normal.        Speech: Speech normal.        Behavior: Behavior normal. Behavior is cooperative.        Thought Content: Thought content normal.   Diabetic Foot Exam - Simple   Simple Foot Form Visual Inspection No deformities, no ulcerations, no other skin breakdown bilaterally: Yes Sensation Testing Intact to touch and monofilament testing bilaterally: Yes Pulse Check Posterior Tibialis and Dorsalis pulse intact bilaterally: Yes Comments     Results for orders placed or performed in visit on 12/12/21  Bayer DCA Hb A1c Waived  Result Value Ref Range   HB A1C (BAYER DCA - WAIVED) 7.6 (H) 4.8 - 5.6 %  Microalbumin, Urine Waived  Result Value Ref Range   Microalb, Ur Waived 80 (H) 0 - 19 mg/L   Creatinine, Urine Waived 200 10 - 300 mg/dL   Microalb/Creat Ratio 30-300 (H) <30 mg/g      Assessment & Plan:   Problem List Items Addressed This Visit       Cardiovascular and Mediastinum   Aortic atherosclerosis (HCC)    Noted on CT 12/11/19.  Continue daily statin.  Monitor closely for symptoms.       Relevant Medications   furosemide  (LASIX ) 40 MG tablet   metoprolol  succinate (TOPROL -XL) 25 MG 24 hr tablet   Other Relevant Orders   Comprehensive metabolic panel   Lipid Panel w/o Chol/HDL Ratio   Atrial fibrillation with RVR (HCC)    Diagnosed on 09/11/21 with underlying PNA.  At this time continue current medication regimen as ordered by cardiology and collaboration with cardiology.  Good rate control today.  Relevant Medications   furosemide  (LASIX ) 40 MG tablet   metoprolol  succinate (TOPROL -XL) 25 MG 24 hr tablet    Other Relevant Orders   Comprehensive metabolic panel   Lipid Panel w/o Chol/HDL Ratio   Chronic HFrEF (heart failure with reduced ejection fraction) (HCC)    Chronic, ongoing with recent EF 40-45% (10/21/21), followed by cardiology.  Euvolemic on exam today.  Continue current medication regimen and collaboration with cardiology.  Recommend: - Reminded to call for an overnight weight gain of >2 pounds or a weekly weight gain of >5 pounds - not adding salt to food and read food labels. Reviewed the importance of keeping daily sodium intake to 2000mg  daily - Avoid Ibuprofen products      Relevant Medications   furosemide  (LASIX ) 40 MG tablet   metoprolol  succinate (TOPROL -XL) 25 MG 24 hr tablet   Other Relevant Orders   Comprehensive metabolic panel   Lipid Panel w/o Chol/HDL Ratio   Hypertension associated with diabetes (HCC)    Chronic, stable with BP at goal today.  Recommend she continue to monitor BP at home regularly.  Focus on DASH diet.  Continue current medication regimen and adjust as needed, refills sent as needed.  CMP today.  Return in 3 months.        Relevant Medications   tirzepatide  (MOUNJARO ) 2.5 MG/0.5ML Pen   tirzepatide  (MOUNJARO ) 5 MG/0.5ML Pen (Start on 01/11/2022)   furosemide  (LASIX ) 40 MG tablet   metoprolol  succinate (TOPROL -XL) 25 MG 24 hr tablet   Other Relevant Orders   Bayer DCA Hb A1c Waived (Completed)   Microalbumin, Urine Waived (Completed)   Comprehensive metabolic panel     Respiratory   COPD, moderate (HCC)    Chronic, ongoing.  Continue Trelegy which offers benefit of medication minimization and has benefited symptoms.  Continue to collaborate with Dr. Theotis, recent notes reviewed.  No current daytime O2, at night only.         Sleep apnea    Poor tolerance of CPAP mask, uses O2 night 2.5 L [place].         Digestive   GERD (gastroesophageal reflux disease)    Chronic, ongoing.  Recent testing by GI performed.  Needs repeat colonoscopy  in 6 months as was not clear.  Continue Protonix  daily and adjust as needed.  Plan on Mag level annually.       Relevant Medications   pantoprazole  (PROTONIX ) 40 MG tablet     Endocrine   Hyperlipidemia associated with type 2 diabetes mellitus (HCC)    Chronic, stable.  Continue current medication regimen and adjust as needed.  Lipid panel today.       Relevant Medications   tirzepatide  (MOUNJARO ) 2.5 MG/0.5ML Pen   tirzepatide  (MOUNJARO ) 5 MG/0.5ML Pen (Start on 01/11/2022)   furosemide  (LASIX ) 40 MG tablet   metoprolol  succinate (TOPROL -XL) 25 MG 24 hr tablet   Other Relevant Orders   Bayer DCA Hb A1c Waived (Completed)   Comprehensive metabolic panel   Lipid Panel w/o Chol/HDL Ratio   Insulin  dependent type 2 diabetes mellitus (HCC) - Primary    Chronic, ongoing, insulin  dependent. A1c 7.6% today, downward trend at this time from 8%.  Foot exam performed today.  Urine ALB ordered. - Will continue Metformin  at max dosing and Tresiba  to 60 units.  Will stop Trulicity  and start Mounjaro , educated her on this, as it may offer her more benefit to A1c control and weight.  Script sent for 2.5 MG weekly x  4 weeks and then increase to 5 MG weekly.  We may reduce insulin  dependent on sugars with this change. - Recommend she continue to monitor BS consistently at home and document + focus heavily on diet changes.  She is aware to notify provider if fasting BS >130 consistently or <70, as may need to adjust insulin  further.   - In past poorly tolerated Jardiance  -- discussed with her that and how SGLT2 would benefit HF -- however concern for side effects.   - Would benefit a [employer], may need to work on coverage for this.  - Return in 3 months.        Relevant Medications   tirzepatide  (MOUNJARO ) 2.5 MG/0.5ML Pen   tirzepatide  (MOUNJARO ) 5 MG/0.5ML Pen (Start on 01/11/2022)   Other Relevant Orders   Bayer DCA Hb A1c Waived (Completed)   Microalbumin, Urine Waived (Completed)    Comprehensive metabolic panel   Type 2 diabetes, controlled, with neuropathy (HCC)    Chronic, ongoing, insulin  dependent. A1c 7.6% today, downward trend at this time from 8%.  Foot exam performed today.  Urine ALB ordered. - Will continue Metformin  at max dosing and Tresiba  to 60 units.  Will stop Trulicity  and start Mounjaro , educated her on this, as it may offer her more benefit to A1c control and weight.  Script sent for 2.5 MG weekly x 4 weeks and then increase to 5 MG weekly.  We may reduce insulin  dependent on sugars with this change. - Continue Gabapentin  for neuropathy, refills sent. - Recommend she continue to monitor BS consistently at home and document + focus heavily on diet changes.  She is aware to notify provider if fasting BS >130 consistently or <70, as may need to adjust insulin  further.   - In past poorly tolerated Jardiance  -- discussed with her that and how SGLT2 would benefit HF -- however concern for side effects.   - Would benefit a [employer], may need to work on coverage for this.  - Return in 3 months.        Relevant Medications   tirzepatide  (MOUNJARO ) 2.5 MG/0.5ML Pen   tirzepatide  (MOUNJARO ) 5 MG/0.5ML Pen (Start on 01/11/2022)   Other Relevant Orders   Bayer DCA Hb A1c Waived (Completed)   Microalbumin, Urine Waived (Completed)   Comprehensive metabolic panel     Genitourinary   Chronic kidney disease, stage III (moderate) (HCC)    Ongoing and stable.  Continue Losartan  at low dose for heart and kidney protection.  Check CMP and urine ALB today.  Will refer to nephrology if any worsening in future.         Other   Presence of intrathecal pump (Chronic)    Continue to collaborate with pain clinic in [facility] who monitors this.       B12 deficiency    Chronic, stable.  B12 level up to date, continue injections monthly.       Chronic pain syndrome    Continue to collaborate with chronic pain clinic in [facility].  Has morphine  pump.          Relevant Medications   gabapentin  (NEURONTIN ) 800 MG tablet   venlafaxine  XR (EFFEXOR -XR) 150 MG 24 hr capsule   Depression, major, single episode, in partial remission (HCC)    Chronic, stable.  Denies SI/HI.  Mood well-controlled.  Continue current medication regimen and adjust as needed.  Refills as needed.  Consider trial of Duloxetine and transition off Effexor  in future for dual  pain and mood benefit.         Relevant Medications   venlafaxine  XR (EFFEXOR -XR) 150 MG 24 hr capsule   Iron  deficiency anemia    Ongoing and stable since infusions.  Will continue to collaborate with hematology as needed, recent notes and labs reviewed.  Will check CBC every 6 months, next 04/25/22.       Obesity    BMI 30.36.  Recommended eating smaller high protein, low fat meals more frequently and exercising 30 mins a day 5 times a week with a goal of 10-15lb weight loss in the next 3 months. Patient voiced their understanding and motivation to adhere to these recommendations.        Relevant Medications   tirzepatide  (MOUNJARO ) 2.5 MG/0.5ML Pen   tirzepatide  (MOUNJARO ) 5 MG/0.5ML Pen (Start on 01/11/2022)   Other Visit Diagnoses     Elevated TSH       Noted on past labs, check today with Free T4.   Relevant Orders   TSH   T4, free        Follow up plan: Return in about 5 weeks (around 01/16/2022) for Diabetes with Mounjaro .

## 2021-12-12 NOTE — Assessment & Plan Note (Signed)
Chronic, ongoing with recent EF 40-45% (10/21/21), followed by cardiology.  Euvolemic on exam today.  Continue current medication regimen and collaboration with cardiology.  Recommend: ?- Reminded to call for an overnight weight gain of >2 pounds or a weekly weight gain of >5 pounds ?- not adding salt to food and read food labels. Reviewed the importance of keeping daily sodium intake to '2000mg'$  daily ?- Avoid Ibuprofen products ?

## 2021-12-12 NOTE — Assessment & Plan Note (Signed)
Diagnosed on 09/11/21 with underlying PNA.  At this time continue current medication regimen as ordered by cardiology and collaboration with cardiology.  Good rate control today.   ?

## 2021-12-13 ENCOUNTER — Encounter: Payer: Self-pay | Admitting: Gastroenterology

## 2021-12-13 LAB — COMPREHENSIVE METABOLIC PANEL
ALT: 11 IU/L (ref 0–32)
AST: 15 IU/L (ref 0–40)
Albumin/Globulin Ratio: 1.4 (ref 1.2–2.2)
Albumin: 4.2 g/dL (ref 3.7–4.7)
Alkaline Phosphatase: 75 IU/L (ref 44–121)
BUN/Creatinine Ratio: 15 (ref 12–28)
BUN: 16 mg/dL (ref 8–27)
Bilirubin Total: <0.2 mg/dL (ref 0.0–1.2)
CO2: 28 mmol/L (ref 20–29)
Calcium: 9.6 mg/dL (ref 8.7–10.3)
Chloride: 95 mmol/L — ABNORMAL LOW (ref 96–106)
Creatinine, Ser: 1.05 mg/dL — ABNORMAL HIGH (ref 0.57–1.00)
Globulin, Total: 3.1 g/dL (ref 1.5–4.5)
Glucose: 176 mg/dL — ABNORMAL HIGH (ref 70–99)
Potassium: 4.2 mmol/L (ref 3.5–5.2)
Sodium: 139 mmol/L (ref 134–144)
Total Protein: 7.3 g/dL (ref 6.0–8.5)
eGFR: 57 mL/min/{1.73_m2} — ABNORMAL LOW (ref >59)

## 2021-12-13 LAB — LIPID PANEL W/O CHOL/HDL RATIO
Cholesterol, Total: 133 mg/dL (ref 100–199)
HDL: 65 mg/dL (ref >39)
LDL Chol Calc (NIH): 46 mg/dL (ref 0–99)
Triglycerides: 129 mg/dL (ref 0–149)
VLDL Cholesterol Cal: 22 mg/dL (ref 5–40)

## 2021-12-13 LAB — T4, FREE: Free T4: 1.71 ng/dL (ref 0.82–1.77)

## 2021-12-13 LAB — TSH: TSH: 2.8 u[IU]/mL (ref 0.450–4.500)

## 2021-12-13 NOTE — Progress Notes (Signed)
Good afternoon crew, please let Jordan Chan know her labs have returned and overall show stable kidney disease stage 3a with no decline.  We will continue to monitor.  Thyroid levels are normal and cholesterol levels are at goal.  Doristine Devoid job!!!  Let me know if any issues getting Mounjaro.  Have a great weekend.  Love you!! ?Keep being amazing!!  Thank you for allowing me to participate in your care.  I appreciate you. ?Kindest regards, ?Brittyn Salaz ?

## 2021-12-16 ENCOUNTER — Telehealth: Payer: Self-pay | Admitting: *Deleted

## 2021-12-16 ENCOUNTER — Telehealth: Payer: Self-pay

## 2021-12-16 ENCOUNTER — Ambulatory Visit (INDEPENDENT_AMBULATORY_CARE_PROVIDER_SITE_OTHER): Payer: Medicare HMO | Admitting: *Deleted

## 2021-12-16 DIAGNOSIS — Z0000 Encounter for general adult medical examination without abnormal findings: Secondary | ICD-10-CM

## 2021-12-16 NOTE — Telephone Encounter (Signed)
Called and left a message for call back  

## 2021-12-16 NOTE — Progress Notes (Signed)
 Subjective:   Jordan Chan is a 71 y.o. female who presents for Medicare Annual (Subsequent) preventive examination.  I connected with  [name] on 12/16/21 by a telephone enabled telemedicine application and verified that I am speaking with the correct person using two identifiers.   I discussed the limitations of evaluation and management by telemedicine. The patient expressed understanding and agreed to proceed.  Patient location: home  Provider location: Tele-Health-home    Review of Systems     Cardiac Risk Factors include: advanced age (>36men, >51 women);diabetes mellitus;hypertension     Objective:    Today's Vitals   12/16/21 0902  PainSc: 6    There is no height or weight on file to calculate BMI.     12/16/2021    9:04 AM 12/11/2021    7:13 AM 09/12/2021    7:00 AM 09/05/2021    4:30 PM 09/05/2021   11:15 AM 08/13/2021    9:32 AM 01/22/2021   11:23 AM  Advanced Directives  Does Patient Have a Medical Advance Directive? Yes Yes Yes Yes Yes Yes Yes  Type of Geologist, engineering will Healthcare Power of [employer] Power of [employer] Power of [place];Living will Healthcare Power of [place];Living will Healthcare Power of [place];Living will  Does patient want to make changes to medical advance directive?   No - Patient declined No - Patient declined  No - Patient declined No - Patient declined  Copy of Healthcare Power of Attorney in Chart? No - copy requested  No - copy requested   No - copy requested No - copy requested  Would patient like information on creating a medical advance directive?      No - Patient declined No - Patient declined    Current Medications (verified) Outpatient Encounter Medications as of 12/16/2021  Medication Sig   ACCU-CHEK AVIVA PLUS test strip TEST THREE TIMES DAILY   Accu-Chek Softclix Lancets lancets TEST BLOOD SUGAR THREE TIMES DAILY   albuterol  (PROVENTIL   HFA;VENTOLIN  HFA) 108 (90 BASE) MCG/ACT inhaler Inhale 2 puffs into the lungs every 6 (six) hours as needed for wheezing or shortness of breath.    Alcohol  Swabs (DROPSAFE ALCOHOL  PREP) 70 % PADS USE TWICE DAILY  WITH  SUGAR  CHECKS   apixaban  (ELIQUIS ) 5 MG TABS tablet Take 1 tablet (5 mg total) by mouth 2 (two) times daily.   atorvastatin  (LIPITOR) 20 MG tablet Take 1 tablet (20 mg total) by mouth daily.   Blood Glucose Monitoring Suppl (TRUE METRIX METER) w/Device KIT Use to check blood sugar 4 times a day   cholecalciferol  (VITAMIN D3) 25 MCG (1000 UNIT) tablet Take 1,000 Units by mouth daily.   Cyanocobalamin  1000 MCG/ML KIT Inject 1,000 mcg as directed every 30 (thirty) days.   cyclobenzaprine  (FLEXERIL ) 5 MG tablet Take 5 mg by mouth 2 (two) times daily as needed for muscle spasms. Takes very rarely   diclofenac  (VOLTAREN ) 50 MG EC tablet TAKE 1 TABLET THREE TIMES DAILY   folic acid  (FOLVITE ) 1 MG tablet TAKE 1 TABLET EVERY DAY   furosemide  (LASIX ) 40 MG tablet Take 1 tablet (40 mg total) by mouth daily.   gabapentin  (NEURONTIN ) 800 MG tablet Take 1 tablet (800 mg total) by mouth 3 (three) times daily.   insulin  degludec (TRESIBA  FLEXTOUCH) 100 UNIT/ML FlexTouch Pen Inject 50 Units into the skin at bedtime.   losartan  (COZAAR ) 25 MG tablet Take 0.5 tablets (12.5 mg total) by mouth  daily.   metFORMIN  (GLUCOPHAGE ) 1000 MG tablet TAKE 1 TABLET TWICE DAILY WITH MEALS   metoprolol  succinate (TOPROL -XL) 25 MG 24 hr tablet Take 0.5 tablets (12.5 mg total) by mouth daily.   montelukast  (SINGULAIR ) 10 MG tablet Take 10 mg by mouth at bedtime.   ondansetron  (ZOFRAN ) 4 MG tablet Take 1 tablet (4 mg total) by mouth every 8 (eight) hours as needed for nausea or vomiting.   PAIN MANAGEMENT INTRATHECAL, IT, PUMP 1 each by Intrathecal route. Intrathecal (IT) medication:  Morphine  Patient does not remember current. Adjusted every 2.5 months at [facility] in [place].   pantoprazole   (PROTONIX ) 40 MG tablet Take 1 tablet (40 mg total) by mouth daily.   tirzepatide  (MOUNJARO ) 2.5 MG/0.5ML Pen Start with 2.5 MG once weekly for 4 weeks.   [START ON 01/11/2022] tirzepatide  (MOUNJARO ) 5 MG/0.5ML Pen Inject 5 mg into the skin once a week.   TRELEGY ELLIPTA  100-62.5-25 MCG/ACT AEPB INHALE 1 PUFF INTO THE LUNGS DAILY   venlafaxine  XR (EFFEXOR -XR) 150 MG 24 hr capsule Take 1 capsule (150 mg total) by mouth daily.   No facility-administered encounter medications on file as of 12/16/2021.    Allergies (verified) Other, Pain patch [menthol], Avelox  [moxifloxacin  hcl in nacl], Doxycycline , Erythromycin , Fentanyl , Moxifloxacin  hcl, Oxycontin [oxycodone], and Ozempic [semaglutide]   History: Past Medical History:  Diagnosis Date   Arthritis    Asthma    Atrial fibrillation (HCC)    Breast cancer (HCC) 1998   right breast ca with mastectomy and chemotherapy and radiation   Bronchitis    CHF (congestive heart failure) (HCC)    with Morphine  withdrawal   COPD (chronic obstructive pulmonary disease) (HCC)    Diabetes mellitus without complication (HCC)    Diverticulitis    diverticulosis also   Dyspnea    Endometriosis    GERD (gastroesophageal reflux disease)    History of shingles 2000-2005   Hypercholesteremia    Hypertension    IBS (irritable bowel syndrome)    Low back pain    a. Implanted morphine /bupivicaine/clonidine  pump.   Neuropathy    Orthopnea    Oxygen  dependent    uses at night   Personal history of chemotherapy    Personal history of radiation therapy    Pneumonia    pneumonia 5-6 times, history of bronchitis also   Scoliosis    Sleep apnea    does not use cpap   Stroke (HCC) 2010   TIA, 10 years ago   Withdrawal from sedative drug (HCC)    withdrawal from morphine  when pump batteries died   Past Surgical History:  Procedure Laterality Date   ABDOMINAL HYSTERECTOMY  1987   BACK SURGERY     Tailbone removed following fracture   BREAST SURGERY  Right    mastectomy   CARDIAC CATHETERIZATION     CATARACT EXTRACTION W/PHACO Right 05/19/2019   Procedure: CATARACT EXTRACTION PHACO AND INTRAOCULAR LENS PLACEMENT (IOC), RIGHT, DIABETIC;  Surgeon: Ferol Rogue, MD;  Location: [facility] ORS;  Service: Ophthalmology;  Laterality: Right;  Lot # [id number] H US : 00:43.8 CDE: 4.59   CATARACT EXTRACTION W/PHACO Left 06/16/2019   Procedure: CATARACT EXTRACTION PHACO AND INTRAOCULAR LENS PLACEMENT (IOC) LEFT VISION BLUE DIABETIC;  Surgeon: Ferol Rogue, MD;  Location: [facility] ORS;  Service: Ophthalmology;  Laterality: Left;  Lot #[id number] H US : 00:46.9 CDE: 6.53   COCCYX REMOVAL     COLONOSCOPY WITH PROPOFOL  N/A 01/01/2017   Procedure: COLONOSCOPY WITH PROPOFOL ;  Surgeon: Therisa Bi, MD;  Location: [facility] ENDOSCOPY;  Service: Endoscopy;  Laterality: N/A;   COLONOSCOPY WITH PROPOFOL  N/A 12/11/2021   Procedure: COLONOSCOPY WITH PROPOFOL ;  Surgeon: Unk Corinn Skiff, MD;  Location: [facility] ENDOSCOPY;  Service: Gastroenterology;  Laterality: N/A;   ELBOW ARTHROSCOPY WITH TENDON RECONSTRUCTION     ESOPHAGOGASTRODUODENOSCOPY (EGD) WITH PROPOFOL  N/A 01/01/2017   Procedure: ESOPHAGOGASTRODUODENOSCOPY (EGD) WITH PROPOFOL ;  Surgeon: Therisa Bi, MD;  Location: [facility] ENDOSCOPY;  Service: Endoscopy;  Laterality: N/A;   ESOPHAGOGASTRODUODENOSCOPY (EGD) WITH PROPOFOL  N/A 12/11/2021   Procedure: ESOPHAGOGASTRODUODENOSCOPY (EGD) WITH PROPOFOL ;  Surgeon: Unk Corinn Skiff, MD;  Location: [facility] ENDOSCOPY;  Service: Gastroenterology;  Laterality: N/A;   INTRATHECAL PUMP IMPLANT     LEFT HEART CATH AND CORONARY ANGIOGRAPHY N/A 04/28/2017   Procedure: LEFT HEART CATH AND CORONARY ANGIOGRAPHY;  Surgeon: Mady Bruckner, MD;  Location: [facility] INVASIVE CV LAB;  Service: Cardiovascular;  Laterality: N/A;   MASTECTOMY Right 06/1997   morphine  pump  2011   PLANTAR FASCIA RELEASE     TRIGGER FINGER RELEASE     Family History  Problem Relation Age of Onset   Cancer Mother 24       lung    Thyroid  disease Mother    Cancer Father 98       Pancreatic   Hypertension Sister    Cancer Sister        cancer on face   Hyperlipidemia Sister    Osteoporosis Maternal Grandmother    Hyperlipidemia Son    Seizures Son    Cancer Paternal Grandmother        colon   Arthritis Paternal Grandfather    Breast cancer Neg Hx    Social History   Socioeconomic History   Marital status: Unknown    Spouse name: Not on file   Number of children: Not on file   Years of education: Not on file   Highest education level: High school graduate  Occupational History   Occupation: retired  Tobacco Use   Smoking status: Former    Packs/day: 1.00    Years: 30.00    Pack years: 30.00    Types: Cigarettes    Quit date: 04/30/2004    Years since quitting: 17.6   Smokeless tobacco: Never  Vaping Use   Vaping Use: Never used  Substance and Sexual Activity   Alcohol  use: No   Drug use: No   Sexual activity: Not Currently    Birth control/protection: Post-menopausal  Other Topics Concern   Not on file  Social History Narrative   ** Merged History Encounter **       Social Determinants of Health   Financial Resource Strain: Low Risk    Difficulty of Paying Living Expenses: Not hard at all  Food Insecurity: No Food Insecurity   Worried About Programme researcher, broadcasting/film/video in the Last Year: Never true   Ran Out of Food in the Last Year: Never true  Transportation Needs: No Transportation Needs   Lack of Transportation (Medical): No   Lack of Transportation (Non-Medical): No  Physical Activity: Insufficiently Active   Days of Exercise per Week: 3 days   Minutes of Exercise per Session: 30 min  Stress: No Stress Concern Present   Feeling of Stress : Not at all  Social Connections: Moderately Isolated   Frequency of Communication with Friends and Family: More than three times a week   Frequency of Social Gatherings with Friends and Family: More than three times a week   Attends Religious  Services: More  than 4 times per year   Active Member of Clubs or Organizations: No   Attends Banker Meetings: Never   Marital Status: Separated    Tobacco Counseling Counseling given: Not Answered   Clinical Intake:  Pre-visit preparation completed: Yes  Pain : 0-10 Pain Score: 6  Pain Type: Chronic pain Pain Location: Back Pain Descriptors / Indicators: Constant, Burning, Aching, Shooting Pain Onset: More than a month ago Pain Frequency: Constant Pain Relieving Factors: morphine  pump implant  Pain Relieving Factors: morphine  pump implant  Nutritional Risks: None Diabetes: Yes CBG done?: No Did pt. bring in CBG monitor from home?: No  How often do you need to have someone help you when you read instructions, pamphlets, or other written materials from your doctor or pharmacy?: 1 - Never  Diabetic?   Yes  Nutrition Risk Assessment:  Has the patient had any N/V/D within the last 2 months?  No  Does the patient have any non-healing wounds?  No  Has the patient had any unintentional weight loss or weight gain?  No   Diabetes:  Is the patient diabetic?  Yes  If diabetic, was a CBG obtained today?  No  Did the patient bring in their glucometer from home?  No  How often do you monitor your CBG's? 3 times a day.   Financial Strains and Diabetes Management:  Are you having any financial strains with the device, your supplies or your medication? No .  Does the patient want to be seen by Chronic Care Management for management of their diabetes?  No  Would the patient like to be referred to a Nutritionist or for Diabetic Management?  No   Diabetic Exams:  Diabetic Eye Exam: Completed  Pt has been advised about the importance in completing this exam.   Diabetic Foot Exam: . Pt has been advised about the importance in completing this exam.  Interpreter Needed?: No  Information entered by :: Mliss Graff LPN   Activities of Daily Living    12/16/2021     9:11 AM 09/12/2021    7:00 AM  In your present state of health, do you have any difficulty performing the following activities:  Hearing? 0 0  Vision? 0 0  Difficulty concentrating or making decisions? 0 0  Walking or climbing stairs? 0 0  Dressing or bathing? 0 0  Doing errands, shopping? 0 0  Preparing Food and eating ? N   Using the Toilet? N   In the past six months, have you accidently leaked urine? N   Do you have problems with loss of bowel control? N   Managing your Medications? N   Managing your Finances? N   Housekeeping or managing your Housekeeping? N     Patient Care Team: [name], Jolene T, NP as PCP - General (Nurse Practitioner) End, Lonni, MD as PCP - Cardiology (Cardiology) Cindie Ole DASEN, MD as PCP - Electrophysiology (Cardiology) Theotis Lavelle BRAVO, MD as Referring Physician (Pulmonary Disease) Nancey Lonni, MD as Referring Physician (Pain Medicine) Ezzard Rolin BIRCH, LCSW as Social Worker (Licensed Clinical Social Worker)  Indicate any recent Medical Services you may have received from other than [facility] providers in the past year (date may be approximate).     Assessment:   This is a routine wellness examination for [place].  Hearing/Vision screen Hearing Screening - Comments:: No trouble hearing Vision Screening - Comments:: Up to date [facility]  Dietary issues and exercise activities discussed: Current Exercise Habits: Home exercise  routine, Type of exercise: stretching (floor pedal), Time (Minutes): 35, Frequency (Times/Week): 3, Weekly Exercise (Minutes/Week): 105, Intensity: Mild, Exercise limited by: orthopedic condition(s)   Goals Addressed             This Visit's Progress    Patient Stated       Would like to spread the word more about the lord       Depression Screen    12/16/2021    9:08 AM 12/12/2021   10:23 AM 10/23/2021   10:49 AM 09/26/2021    9:56 AM 09/17/2021    9:00 AM 08/14/2021   11:08 AM 07/31/2021     8:45 AM  PHQ 2/9 Scores  PHQ - 2 Score 0 1 1 4 5 2 1   PHQ- 9 Score 8 9 9 14 16 9 9     Fall Risk    12/16/2021    9:39 AM 12/12/2021   10:23 AM 10/23/2021   10:48 AM 08/14/2021   11:07 AM 12/14/2020    9:23 AM  Fall Risk   Falls in the past year? 0 0 0 0 0  Number falls in past yr: 0 0 0 0   Injury with Fall? 0 0 0 0   Risk for fall due to :  No Fall Risks No Fall Risks No Fall Risks Medication side effect  Follow up Falls evaluation completed;Education provided;Falls prevention discussed Falls evaluation completed Falls evaluation completed Falls evaluation completed Falls evaluation completed;Education provided;Falls prevention discussed    FALL RISK PREVENTION PERTAINING TO THE HOME:  Any stairs in or around the home? No  If so, are there any without handrails? No  Home free of loose throw rugs in walkways, pet beds, electrical cords, etc? Yes  Adequate lighting in your home to reduce risk of falls? Yes   ASSISTIVE DEVICES UTILIZED TO PREVENT FALLS:  Life alert? No  Use of a cane, walker or w/c? No  Grab bars in the bathroom? Yes  Shower chair or bench in shower? No  Elevated toilet seat or a handicapped toilet? No   TIMED UP AND GO:  Was the test performed? No .    Cognitive Function:        12/16/2021    9:05 AM 12/14/2020    9:26 AM 12/06/2018    9:39 AM 12/04/2017    8:20 AM  6CIT Screen  What Year? 0 points 0 points 0 points 0 points  What month? 0 points 0 points 0 points 0 points  What time? 0 points 0 points 0 points 0 points  Count back from 20 0 points 0 points 0 points 0 points  Months in reverse 0 points 0 points 0 points 0 points  Repeat phrase 0 points 0 points 0 points 0 points  Total Score 0 points 0 points 0 points 0 points    Immunizations Immunization History  Administered Date(s) Administered   Fluad Quad(high Dose 65+) 04/19/2019, 07/26/2020, 04/29/2021   Influenza, High Dose Seasonal PF 04/28/2016, 04/13/2017, 04/14/2018    Influenza,inj,Quad PF,6+ Mos 06/18/2015   Influenza-Unspecified 04/04/2014   PFIZER(Purple Top)SARS-COV-2 Vaccination 10/20/2019, 11/10/2019, 05/31/2020   Pneumococcal Conjugate-13 06/18/2015   Pneumococcal Polysaccharide-23 08/06/2005, 08/05/2016   Td 08/28/2007, 01/17/2019   Zoster Recombinat (Shingrix ) 04/29/2021, 07/23/2021    TDAP status: Up to date  Flu Vaccine status: Up to date  Pneumococcal vaccine status: Up to date  Covid-19 vaccine status: Information provided on how to obtain vaccines.   Qualifies for Shingles Vaccine? Yes  Zostavax completed No   Shingrix  Completed?: Yes  Screening Tests Health Maintenance  Topic Date Due   COVID-19 Vaccine (4 - Booster for Pfizer series) 12/28/2021 (Originally 07/26/2020)   INFLUENZA VACCINE  03/04/2022   OPHTHALMOLOGY EXAM  03/15/2022   HEMOGLOBIN A1C  06/14/2022   FOOT EXAM  12/13/2022   URINE MICROALBUMIN  12/13/2022   MAMMOGRAM  03/14/2023   TETANUS/TDAP  01/16/2029   COLONOSCOPY (Pts 45-66yrs Insurance coverage will need to be confirmed)  12/12/2031   Pneumonia Vaccine 26+ Years old  Completed   DEXA SCAN  Completed   Hepatitis C Screening  Completed   Zoster Vaccines- Shingrix   Completed   HPV VACCINES  Aged Out    Health Maintenance  There are no preventive care reminders to display for this patient.  Colorectal cancer screening: Type of screening: Colonoscopy. Completed 2022. Repeat every 1 years  Mammogram status: Completed  . Repeat every year  Bone Density completed 2017  Lung Cancer Screening: (Low Dose CT Chest recommended if Age 26-80 years, 30 pack-year currently smoking OR have quit w/in 15years.) does not qualify.   Lung Cancer Screening Referral:  Additional Screening:  Hepatitis C Screening: does not qualify; Completed 2016  Vision Screening: Recommended annual ophthalmology exams for early detection of glaucoma and other disorders of the eye. Is the patient up to date with their annual eye  exam?  Yes  Who is the provider or what is the name of the office in which the patient attends annual eye exams? [facility] If pt is not established with a provider, would they like to be referred to a provider to establish care? No .   Dental Screening: Recommended annual dental exams for proper oral hygiene  Community Resource Referral / Chronic Care Management: CRR required this visit?  No   CCM required this visit?  No      Plan:     I have personally reviewed and noted the following in the patient's chart:   Medical and social history Use of alcohol , tobacco or illicit drugs  Current medications and supplements including opioid prescriptions.  Functional ability and status Nutritional status Physical activity Advanced directives List of other physicians Hospitalizations, surgeries, and ER visits in previous 12 months Vitals Screenings to include cognitive, depression, and falls Referrals and appointments  In addition, I have reviewed and discussed with patient certain preventive protocols, quality metrics, and best practice recommendations. A written personalized care plan for preventive services as well as general preventive health recommendations were provided to patient.     Mliss Graff, LPN   [id number]   Nurse Notes:

## 2021-12-16 NOTE — Telephone Encounter (Signed)
Before agent could transfer pt to me the line either disconnected or she hung up.    Left voicemail to call back to [facility].   ?

## 2021-12-16 NOTE — Patient Instructions (Signed)
 Jordan Chan , Thank you for taking time to come for your Medicare Wellness Visit. I appreciate your ongoing commitment to your health goals. Please review the following plan we discussed and let me know if I can assist you in the future.   Screening recommendations/referrals: Colonoscopy: Education provided Mammogram: up to date Bone Density: up to date Recommended yearly ophthalmology/optometry visit for glaucoma screening and checkup Recommended yearly dental visit for hygiene and checkup  Vaccinations: Influenza vaccine:up to date Pneumococcal vaccine: up to date Tdap vaccine: up to date Shingles vaccine: up to date    Advanced directives: copy requested  Conditions/risks identified:   Next appointment: 01-22-2022 @ 11:00  Jordan Chan   Preventive Care 72 Years and Older, Female Preventive care refers to lifestyle choices and visits with your health care provider that can promote health and wellness. What does preventive care include? A yearly physical exam. This is also called an annual well check. Dental exams once or twice a year. Routine eye exams. Ask your health care provider how often you should have your eyes checked. Personal lifestyle choices, including: Daily care of your teeth and gums. Regular physical activity. Eating a healthy diet. Avoiding tobacco and drug use. Limiting alcohol  use. Practicing safe sex. Taking low-dose aspirin  every day. Taking vitamin and mineral supplements as recommended by your health care provider. What happens during an annual well check? The services and screenings done by your health care provider during your annual well check will depend on your age, overall health, lifestyle risk factors, and family history of disease. Counseling  Your health care provider may ask you questions about your: Alcohol  use. Tobacco use. Drug use. Emotional well-being. Home and relationship well-being. Sexual activity. Eating habits. History of  falls. Memory and ability to understand (cognition). Work and work Astronomer. Reproductive health. Screening  You may have the following tests or measurements: Height, weight, and BMI. Blood pressure. Lipid and cholesterol levels. These may be checked every 5 years, or more frequently if you are over 72 years old. Skin check. Lung cancer screening. You may have this screening every year starting at age 72 if you have a 30-pack-year history of smoking and currently smoke or have quit within the past 15 years. Fecal occult blood test (FOBT) of the stool. You may have this test every year starting at age 72. Flexible sigmoidoscopy or colonoscopy. You may have a sigmoidoscopy every 5 years or a colonoscopy every 10 years starting at age 72. Hepatitis C blood test. Hepatitis B blood test. Sexually transmitted disease (STD) testing. Diabetes screening. This is done by checking your blood sugar (glucose) after you have not eaten for a while (fasting). You may have this done every 1-3 years. Bone density scan. This is done to screen for osteoporosis. You may have this done starting at age 72. Mammogram. This may be done every 1-2 years. Talk to your health care provider about how often you should have regular mammograms. Talk with your health care provider about your test results, treatment options, and if necessary, the need for more tests. Vaccines  Your health care provider may recommend certain vaccines, such as: Influenza vaccine. This is recommended every year. Tetanus, diphtheria, and acellular pertussis (Tdap, Td) vaccine. You may need a Td booster every 10 years. Zoster vaccine. You may need this after age 72. Pneumococcal 13-valent conjugate (PCV13) vaccine. One dose is recommended after age 72. Pneumococcal polysaccharide (PPSV23) vaccine. One dose is recommended after age 72. Talk to your health care provider  about which screenings and vaccines you need and how often you need  them. This information is not intended to replace advice given to you by your health care provider. Make sure you discuss any questions you have with your health care provider. Document Released: 08/17/2015 Document Revised: 04/09/2016 Document Reviewed: 05/22/2015 Elsevier Interactive Patient Education  2017 [employer].  Fall Prevention in the Home Falls can cause injuries. They can happen to people of all ages. There are many things you can do to make your home safe and to help prevent falls. What can I do on the outside of my home? Regularly fix the edges of walkways and driveways and fix any cracks. Remove anything that might make you trip as you walk through a door, such as a raised step or threshold. Trim any bushes or trees on the path to your home. Use bright outdoor lighting. Clear any walking paths of anything that might make someone trip, such as rocks or tools. Regularly check to see if handrails are loose or broken. Make sure that both sides of any steps have handrails. Any raised decks and porches should have guardrails on the edges. Have any leaves, snow, or ice cleared regularly. Use sand or salt on walking paths during winter. Clean up any spills in your garage right away. This includes oil or grease spills. What can I do in the bathroom? Use night lights. Install grab bars by the toilet and in the tub and shower. Do not use towel bars as grab bars. Use non-skid mats or decals in the tub or shower. If you need to sit down in the shower, use a plastic, non-slip stool. Keep the floor dry. Clean up any water  that spills on the floor as soon as it happens. Remove soap buildup in the tub or shower regularly. Attach bath mats securely with double-sided non-slip rug tape. Do not have throw rugs and other things on the floor that can make you trip. What can I do in the bedroom? Use night lights. Make sure that you have a light by your bed that is easy to reach. Do not use  any sheets or blankets that are too big for your bed. They should not hang down onto the floor. Have a firm chair that has side arms. You can use this for support while you get dressed. Do not have throw rugs and other things on the floor that can make you trip. What can I do in the kitchen? Clean up any spills right away. Avoid walking on wet floors. Keep items that you use a lot in easy-to-reach places. If you need to reach something above you, use a strong step stool that has a grab bar. Keep electrical cords out of the way. Do not use floor polish or wax that makes floors slippery. If you must use wax, use non-skid floor wax. Do not have throw rugs and other things on the floor that can make you trip. What can I do with my stairs? Do not leave any items on the stairs. Make sure that there are handrails on both sides of the stairs and use them. Fix handrails that are broken or loose. Make sure that handrails are as long as the stairways. Check any carpeting to make sure that it is firmly attached to the stairs. Fix any carpet that is loose or worn. Avoid having throw rugs at the top or bottom of the stairs. If you do have throw rugs, attach them to the floor  with carpet tape. Make sure that you have a light switch at the top of the stairs and the bottom of the stairs. If you do not have them, ask someone to add them for you. What else can I do to help prevent falls? Wear shoes that: Do not have high heels. Have rubber bottoms. Are comfortable and fit you well. Are closed at the toe. Do not wear sandals. If you use a stepladder: Make sure that it is fully opened. Do not climb a closed stepladder. Make sure that both sides of the stepladder are locked into place. Ask someone to hold it for you, if possible. Clearly mark and make sure that you can see: Any grab bars or handrails. First and last steps. Where the edge of each step is. Use tools that help you move around (mobility aids)  if they are needed. These include: Canes. Walkers. Scooters. Crutches. Turn on the lights when you go into a dark area. Replace any light bulbs as soon as they burn out. Set up your furniture so you have a clear path. Avoid moving your furniture around. If any of your floors are uneven, fix them. If there are any pets around you, be aware of where they are. Review your medicines with your doctor. Some medicines can make you feel dizzy. This can increase your chance of falling. Ask your doctor what other things that you can do to help prevent falls. This information is not intended to replace advice given to you by your health care provider. Make sure you discuss any questions you have with your health care provider. Document Released: 05/17/2009 Document Revised: 12/27/2015 Document Reviewed: 08/25/2014 Elsevier Interactive Patient Education  2017 [employer].

## 2021-12-16 NOTE — Progress Notes (Signed)
Before agent could get pt transferred to me the line disconnected or she hung up.   Left a voicemail to call back to [facility]. ?

## 2021-12-16 NOTE — Telephone Encounter (Signed)
-----   Message from Lin Landsman, MD sent at 12/15/2021 11:45 PM EDT ----- ?Please inform patient that the pathology results from upper endoscopy came back normal.  She should continue taking Protonix 40 mg daily for 3 months.  If she is still having difficulty swallowing, I recommend repeat upper endoscopy with stretching of the esophagus ? ? ?Rohini Vanga ?

## 2021-12-17 NOTE — Telephone Encounter (Signed)
Patient verbalized understanding of results  

## 2021-12-26 ENCOUNTER — Other Ambulatory Visit: Payer: Self-pay | Admitting: Nurse Practitioner

## 2021-12-26 ENCOUNTER — Other Ambulatory Visit: Payer: Self-pay | Admitting: Internal Medicine

## 2021-12-26 ENCOUNTER — Other Ambulatory Visit: Payer: Self-pay | Admitting: Physician Assistant

## 2021-12-30 NOTE — Telephone Encounter (Signed)
Rx filled 12/12/21 #90 4RF- sent to local pharmacy. Request for RF through mail order- remainder of RF sent to mail order as requested. Requested Prescriptions  Pending Prescriptions Disp Refills  . venlafaxine XR (EFFEXOR-XR) 150 MG 24 hr capsule [Pharmacy Med Name: VENLAFAXINE HCL ER 150 MG Capsule Extended Release 24 Hour] 90 capsule 3    Sig: TAKE 1 CAPSULE EVERY DAY     Psychiatry: Antidepressants - SNRI - desvenlafaxine & venlafaxine Failed - 12/26/2021 11:08 AM      Failed - Cr in normal range and within 360 days    Creatinine  Date Value Ref Range Status  09/14/2013 0.68 0.60 - 1.30 mg/dL Final   Creatinine, Ser  Date Value Ref Range Status  12/12/2021 1.05 (H) 0.57 - 1.00 mg/dL Final         Failed - Lipid Panel in normal range within the last 12 months    Cholesterol, Total  Date Value Ref Range Status  12/12/2021 133 100 - 199 mg/dL Final   Cholesterol Piccolo, Waived  Date Value Ref Range Status  07/26/2019 127 <200 mg/dL Final    Comment:                            Desirable                <200                         Borderline High      200- 239                         High                     >239    LDL Chol Calc (NIH)  Date Value Ref Range Status  12/12/2021 46 0 - 99 mg/dL Final   HDL  Date Value Ref Range Status  12/12/2021 65 >39 mg/dL Final   Triglycerides  Date Value Ref Range Status  12/12/2021 129 0 - 149 mg/dL Final   Triglycerides Piccolo,Waived  Date Value Ref Range Status  07/26/2019 104 <150 mg/dL Final    Comment:                            Normal                   <150                         Borderline High     150 - 199                         High                200 - 499                         Very High                >499          Passed - Completed PHQ-2 or PHQ-9 in the last 360 days      Passed - Last BP in normal range    BP Readings from Last 1 Encounters:  12/12/21 101/61  Passed - Valid encounter within  last 6 months    Recent Outpatient Visits          2 weeks ago Insulin dependent type 2 diabetes mellitus ([place])   [place], Jolene T, NP   2 months ago Atrial fibrillation with RVR ([place])   [place], [place] T, NP   3 months ago Sepsis due to pneumonia [facility])   [place], Jolene T, NP   3 months ago Sepsis due to pneumonia ([place])   [place], Jolene T, NP   4 months ago COPD, moderate ([place])   [place], [name], NP      Future Appointments            In 3 weeks [name], [name], NP [employer], [facility]   In 4 months End, Harrell Gave, MD [facility], [facility]

## 2022-01-03 DIAGNOSIS — J45909 Unspecified asthma, uncomplicated: Secondary | ICD-10-CM | POA: Diagnosis not present

## 2022-01-03 DIAGNOSIS — J449 Chronic obstructive pulmonary disease, unspecified: Secondary | ICD-10-CM | POA: Diagnosis not present

## 2022-01-07 ENCOUNTER — Telehealth: Payer: Self-pay

## 2022-01-07 NOTE — Chronic Care Management (AMB) (Signed)
    Chronic Care Management Pharmacy Assistant   Name: LEYLA SOLIZ  MRN: [id number] DOB: [birth year]  Reason for Encounter: Disease State-General    Medications: Outpatient Encounter Medications as of 01/07/2022  Medication Sig   ACCU-CHEK AVIVA PLUS test strip TEST THREE TIMES DAILY   Accu-Chek Softclix Lancets lancets TEST BLOOD SUGAR THREE TIMES DAILY   albuterol (PROVENTIL HFA;VENTOLIN HFA) 108 (90 BASE) MCG/ACT inhaler Inhale 2 puffs into the lungs every 6 (six) hours as needed for wheezing or shortness of breath.    Alcohol Swabs (DROPSAFE ALCOHOL PREP) 70 % PADS USE TWICE DAILY  WITH  SUGAR  CHECKS   apixaban (ELIQUIS) 5 MG TABS tablet Take 1 tablet (5 mg total) by mouth 2 (two) times daily.   atorvastatin (LIPITOR) 20 MG tablet Take 1 tablet (20 mg total) by mouth daily.   Blood Glucose Monitoring Suppl (TRUE METRIX METER) w/Device KIT Use to check blood sugar 4 times a day   cholecalciferol (VITAMIN D3) 25 MCG (1000 UNIT) tablet Take 1,000 Units by mouth daily.   Cyanocobalamin 1000 MCG/ML KIT Inject 1,000 mcg as directed every 30 (thirty) days.   cyclobenzaprine (FLEXERIL) 5 MG tablet Take 5 mg by mouth 2 (two) times daily as needed for muscle spasms. Takes very rarely   diclofenac (VOLTAREN) 50 MG EC tablet TAKE 1 TABLET THREE TIMES DAILY   folic acid (FOLVITE) 1 MG tablet TAKE 1 TABLET EVERY DAY   furosemide (LASIX) 40 MG tablet TAKE 1 TABLET EVERY DAY   gabapentin (NEURONTIN) 800 MG tablet Take 1 tablet (800 mg total) by mouth 3 (three) times daily.   insulin degludec (TRESIBA FLEXTOUCH) 100 UNIT/ML FlexTouch Pen Inject 50 Units into the skin at bedtime.   losartan (COZAAR) 25 MG tablet Take 0.5 tablets (12.5 mg total) by mouth daily.   metFORMIN (GLUCOPHAGE) 1000 MG tablet TAKE 1 TABLET TWICE DAILY WITH MEALS   metoprolol succinate (TOPROL-XL) 25 MG 24 hr tablet TAKE 1/2 TABLET EVERY DAY   montelukast (SINGULAIR) 10 MG tablet Take 10 mg by mouth at bedtime.    ondansetron (ZOFRAN) 4 MG tablet Take 1 tablet (4 mg total) by mouth every 8 (eight) hours as needed for nausea or vomiting.   PAIN MANAGEMENT INTRATHECAL, IT, PUMP 1 each by Intrathecal route. Intrathecal (IT) medication:  Morphine Patient does not remember current. Adjusted every 2.5 months at [facility] in [place].   pantoprazole (PROTONIX) 40 MG tablet Take 1 tablet (40 mg total) by mouth daily.   tirzepatide (MOUNJARO) 2.5 MG/0.5ML Pen Start with 2.5 MG once weekly for 4 weeks.   [START ON 01/11/2022] tirzepatide (MOUNJARO) 5 MG/0.5ML Pen Inject 5 mg into the skin once a week.   TRELEGY ELLIPTA 100-62.5-25 MCG/ACT AEPB INHALE 1 PUFF INTO THE LUNGS DAILY   venlafaxine XR (EFFEXOR-XR) 150 MG 24 hr capsule TAKE 1 CAPSULE EVERY DAY   No facility-administered encounter medications on file as of 01/07/2022.   Note: During chart review for this patient a note from 12/02/21 stated that patient declined further f/u and engagement from Pharmacist. Patient un-enrolled from CCM services.  [place] Pharmacist Assistant [phone]

## 2022-01-18 NOTE — Assessment & Plan Note (Signed)
Jardiance caused illness in past.

## 2022-01-18 NOTE — Patient Instructions (Signed)

## 2022-01-22 ENCOUNTER — Ambulatory Visit (INDEPENDENT_AMBULATORY_CARE_PROVIDER_SITE_OTHER): Payer: Medicare HMO | Admitting: Nurse Practitioner

## 2022-01-22 ENCOUNTER — Encounter: Payer: Self-pay | Admitting: Nurse Practitioner

## 2022-01-22 VITALS — BP 101/64 | HR 80 | Temp 97.9°F | Ht 58.5 in | Wt 151.6 lb

## 2022-01-22 DIAGNOSIS — F324 Major depressive disorder, single episode, in partial remission: Secondary | ICD-10-CM

## 2022-01-22 DIAGNOSIS — Z794 Long term (current) use of insulin: Secondary | ICD-10-CM | POA: Diagnosis not present

## 2022-01-22 DIAGNOSIS — E119 Type 2 diabetes mellitus without complications: Secondary | ICD-10-CM

## 2022-01-22 DIAGNOSIS — E1140 Type 2 diabetes mellitus with diabetic neuropathy, unspecified: Secondary | ICD-10-CM

## 2022-01-22 MED ORDER — TIRZEPATIDE 7.5 MG/0.5ML ~~LOC~~ SOAJ: 7.5000 mg | SUBCUTANEOUS | 1 refills | Status: DC

## 2022-01-22 NOTE — Assessment & Plan Note (Signed)
Chronic, ongoing, insulin dependent. A1c 7.6% today, downward trend at this time from 8%.  Foot exam performed today.  Urine ALB  80 on labs May 2023, continue ARB. - Will continue Metformin at max dosing and Tresiba to 60 units.  Continue Mounjaro, educated her on this, as it may offer her more benefit to A1c control and weight.  Will send 7.5 MG weekly dosing as next step up in 3 weeks when has completed 5 MG, she is aware of this.  We may reduce insulin dependent on sugars with this change. - Recommend she continue to monitor BS consistently at home and document + focus heavily on diet changes.  She is aware to notify provider if fasting BS >130 consistently or <70, as may need to adjust insulin further.   - In past poorly tolerated Jardiance -- discussed with her that and how SGLT2 would benefit HF -- however concern for side effects.   - Would benefit a [employer], may need to work on coverage for this.  - Return in 2 months.

## 2022-01-22 NOTE — Assessment & Plan Note (Signed)
Chronic, exacerbated due to stressors.  Denies SI/HI.  Mood well-controlled at baseline.  Continue current medication regimen and adjust as needed.  Refills as needed.  Consider trial of Duloxetine and transition off Effexor in future for dual pain and mood benefit.

## 2022-01-22 NOTE — Progress Notes (Signed)
BP 101/64   Pulse 80   Temp 97.9 F (36.6 C) (Oral)   Ht 4' 10.5" (1.486 m)   Wt 151 lb 9.6 oz (68.8 kg)   LMP  (LMP Unknown)   SpO2 92%   BMI 31.15 kg/m    Subjective:    [name], female    DOB: [birth year], 71 y.o.   MRN: [id number]  HPI: Jordan Chan is a 71 y.o. female  Chief Complaint  Patient presents with   Diabetes    Patient is here to follow up on Diabetes since starting the [facility] prescription. Patient says the medication is doing well and states her levels have been running up and she knows it is because of her nerves.   Fatigue   DIABETES Last A1c was 7.6% May, we stopped Trulicity and started [facility] -- she started 5 MG dosing 2 weeks ago.  Continues on Metformin 1000 MG BID and Tresiba 50 units at HS.  Works with [employer] team and provider. She has tried [place] in past and this made her sick.   Hypoglycemic episodes: none Polydipsia/polyuria: no Visual disturbance: no Chest pain: no Paresthesias: no Glucose Monitoring: yes  Accucheck frequency: BID  Fasting glucose: this morning 260, has been running high due to stressors  Post prandial: 180 to 200  Evening: 200 range  Before meals: Taking Insulin?: yes  Long acting insulin: Tresiba 50 units  Short acting insulin: Blood Pressure Monitoring: occasional Retinal Examination: Up to Date Foot Exam: Up to Date Pneumovax: Up to Date Influenza: Up to Date Aspirin: yes   DEPRESSION Continues on Effexor 150 MG daily.  She is currently under stress, as the man who owns her house is selling it.  Her brother-in-law is going to buy her a house, is a Engineer, technical sales.  Stressors are leading to increased fatigue. Mood status: controlled Satisfied with current treatment?: yes Symptom severity: mild  Duration of current treatment : chronic Side effects: no Medication compliance: good compliance Psychotherapy/counseling: has gone in the past Depressed mood: yes Anxious mood:  yes Anhedonia: no Significant weight loss or gain: no Insomnia: no Fatigue: yes with stressors Feelings of worthlessness or guilt: no Impaired concentration/indecisiveness: no Suicidal ideations: no Hopelessness: no Crying spells: yes    01/22/2022   11:00 AM 12/16/2021    9:08 AM 12/12/2021   10:23 AM 10/23/2021   10:49 AM 09/26/2021    9:56 AM  Depression screen PHQ 2/9  Decreased Interest 1 0 0 0 2  Down, Depressed, Hopeless 2 0 _0 PHQ - 2 Score 3 0 _1 Altered sleeping _2 Tired, decreased energy _3 Change in appetite _4 Feeling bad or failure about yourself  0 0 0 0 0  Trouble concentrating 0 0 0 0 1  Moving slowly or fidgety/restless 0 0 0 0 0  Suicidal thoughts 0 0 0 0 0  PHQ-9 Score _5 Difficult doing work/chores Not difficult at all          01/22/2022   11:00 AM 12/12/2021   10:23 AM 10/23/2021   10:49 AM 09/26/2021    9:56 AM  GAD 7 : Generalized Anxiety Score  Nervous, Anxious, on Edge _6 Control/stop worrying 2 1 0 2  Worry too much - different things 2 1 0 2  Trouble relaxing 2 1  2 2  Restless 1 0 0 0  Easily annoyed or irritable 1 0 0 1  Afraid - awful might happen 0 0 0 1  Total GAD 7 Score _0 Anxiety Difficulty Somewhat difficult Not difficult at all Not difficult at all Somewhat difficult     Relevant past medical, surgical, family and social history reviewed and updated as indicated. Interim medical history since our last visit reviewed. Allergies and medications reviewed and updated.  Review of Systems  Constitutional:  Negative for activity change, appetite change, diaphoresis, fatigue and fever.  Respiratory:  Negative for cough, chest tightness and shortness of breath.   Cardiovascular:  Negative for chest pain, palpitations and leg swelling.  Gastrointestinal: Negative.   Endocrine: Negative for heat intolerance, polydipsia, polyphagia and polyuria.  Neurological: Negative.    Psychiatric/Behavioral: Negative.      Per HPI unless specifically indicated above     Objective:    BP 101/64   Pulse 80   Temp 97.9 F (36.6 C) (Oral)   Ht 4' 10.5" (1.486 m)   Wt 151 lb 9.6 oz (68.8 kg)   LMP  (LMP Unknown)   SpO2 92%   BMI 31.15 kg/m   Wt Readings from Last 3 Encounters:  01/22/22 151 lb 9.6 oz (68.8 kg)  12/12/21 147 lb 12.8 oz (67 kg)  12/11/21 148 lb (67.1 kg)    Physical Exam Vitals and nursing note reviewed.  Constitutional:      General: She is awake. She is not in acute distress.    Appearance: She is well-developed and well-groomed. She is not ill-appearing.  HENT:     Head: Normocephalic.     Right Ear: Hearing normal.     Left Ear: Hearing normal.  Eyes:     General: Lids are normal.        Right eye: No discharge.        Left eye: No discharge.     Conjunctiva/sclera: Conjunctivae normal.     Pupils: Pupils are equal, round, and reactive to light.  Neck:     Thyroid: No thyromegaly.     Vascular: No carotid bruit.  Cardiovascular:     Rate and Rhythm: Normal rate and regular rhythm.     Heart sounds: Normal heart sounds. No murmur heard.    No gallop.  Pulmonary:     Effort: Pulmonary effort is normal. No accessory muscle usage or respiratory distress.     Breath sounds: Normal breath sounds. No wheezing or rhonchi.  Abdominal:     General: Bowel sounds are normal.     Palpations: Abdomen is soft.  Musculoskeletal:     Cervical back: Normal range of motion and neck supple.     Right lower leg: No edema.     Left lower leg: No edema.  Lymphadenopathy:     Cervical: No cervical adenopathy.  Skin:    General: Skin is warm and dry.  Neurological:     Mental Status: She is alert and oriented to person, place, and time.  Psychiatric:        Attention and Perception: Attention normal.        Mood and Affect: Mood normal.        Speech: Speech normal.        Behavior: Behavior normal. Behavior is cooperative.        Thought  Content: Thought content normal.    Results for orders placed or performed in visit on 12/12/21  Bayer DCA Hb A1c Waived  Result Value Ref Range   HB A1C (BAYER DCA - WAIVED) 7.6 (H) 4.8 - 5.6 %  Microalbumin, Urine Waived  Result Value Ref Range   Microalb, Ur Waived 80 (H) 0 - 19 mg/L   Creatinine, Urine Waived 200 10 - 300 mg/dL   Microalb/Creat Ratio 30-300 (H) <30 mg/g  Comprehensive metabolic panel  Result Value Ref Range   Glucose 176 (H) 70 - 99 mg/dL   BUN 16 8 - 27 mg/dL   Creatinine, Ser 1.05 (H) 0.57 - 1.00 mg/dL   eGFR 57 (L) >59 mL/min/1.73   BUN/Creatinine Ratio 15 12 - 28   Sodium 139 134 - 144 mmol/L   Potassium 4.2 3.5 - 5.2 mmol/L   Chloride 95 (L) 96 - 106 mmol/L   CO2 28 20 - 29 mmol/L   Calcium 9.6 8.7 - 10.3 mg/dL   Total Protein 7.3 6.0 - 8.5 g/dL   Albumin 4.2 3.7 - 4.7 g/dL   Globulin, Total 3.1 1.5 - 4.5 g/dL   Albumin/Globulin Ratio 1.4 1.2 - 2.2   Bilirubin Total <0.2 0.0 - 1.2 mg/dL   Alkaline Phosphatase 75 44 - 121 IU/L   AST 15 0 - 40 IU/L   ALT 11 0 - 32 IU/L  TSH  Result Value Ref Range   TSH 2.800 0.450 - 4.500 uIU/mL  T4, free  Result Value Ref Range   Free T4 1.71 0.82 - 1.77 ng/dL  Lipid Panel w/o Chol/HDL Ratio  Result Value Ref Range   Cholesterol, Total 133 100 - 199 mg/dL   Triglycerides 129 0 - 149 mg/dL   HDL 65 >39 mg/dL   VLDL Cholesterol Cal 22 5 - 40 mg/dL   LDL Chol Calc (NIH) 46 0 - 99 mg/dL      Assessment & Plan:   Problem List Items Addressed This Visit       Endocrine   Insulin dependent type 2 diabetes mellitus (HCC) - Primary    Chronic, ongoing, insulin dependent. A1c 7.6% today, downward trend at this time from 8%.  Foot exam performed today.  Urine ALB  80 on labs May 2023, continue ARB. - Will continue Metformin at max dosing and Tresiba to 60 units.  Continue Mounjaro, educated her on this, as it may offer her more benefit to A1c control and weight.  Will send 7.5 MG weekly dosing as next step up in 3  weeks when has completed 5 MG, she is aware of this.  We may reduce insulin dependent on sugars with this change. - Recommend she continue to monitor BS consistently at home and document + focus heavily on diet changes.  She is aware to notify provider if fasting BS >130 consistently or <70, as may need to adjust insulin further.   - In past poorly tolerated Jardiance -- discussed with her that and how SGLT2 would benefit HF -- however concern for side effects.   - Would benefit a [employer], may need to work on coverage for this.  - Return in 2 months.       Relevant Medications   tirzepatide [facility]) 7.5 MG/0.5ML Pen (Start on 01/29/2022)     Other   Depression, major, single episode, in partial remission (HCC)    Chronic, exacerbated due to stressors.  Denies SI/HI.  Mood well-controlled at baseline.  Continue current medication regimen and adjust as needed.  Refills as needed.  Consider trial of Duloxetine and transition off Effexor  in future for dual pain and mood benefit.          Follow up plan: Return in about 2 months (around 03/24/2022) for T2DM, HTN/HLD, MOOD, COPD, CHRONIC PAIN.

## 2022-02-01 ENCOUNTER — Other Ambulatory Visit: Payer: Self-pay | Admitting: Nurse Practitioner

## 2022-02-02 DIAGNOSIS — J449 Chronic obstructive pulmonary disease, unspecified: Secondary | ICD-10-CM | POA: Diagnosis not present

## 2022-02-02 DIAGNOSIS — J45909 Unspecified asthma, uncomplicated: Secondary | ICD-10-CM | POA: Diagnosis not present

## 2022-02-03 NOTE — Telephone Encounter (Signed)
Requested Prescriptions  Pending Prescriptions Disp Refills  . diclofenac (VOLTAREN) 50 MG EC tablet [Pharmacy Med Name: DICLOFENAC SODIUM DR 50 MG Tablet Delayed Release] 270 tablet 0    Sig: TAKE 1 TABLET THREE TIMES DAILY     Analgesics:  NSAIDS Failed - 02/01/2022 10:34 AM      Failed - Manual Review: Labs are only required if the patient has taken medication for more than 8 weeks.      Failed - Cr in normal range and within 360 days    Creatinine  Date Value Ref Range Status  09/14/2013 0.68 0.60 - 1.30 mg/dL Final   Creatinine, Ser  Date Value Ref Range Status  12/12/2021 1.05 (H) 0.57 - 1.00 mg/dL Final         Passed - HGB in normal range and within 360 days    Hemoglobin  Date Value Ref Range Status  10/23/2021 12.6 11.1 - 15.9 g/dL Final         Passed - PLT in normal range and within 360 days    Platelets  Date Value Ref Range Status  10/23/2021 420 150 - 450 x10E3/uL Final         Passed - HCT in normal range and within 360 days    Hematocrit  Date Value Ref Range Status  10/23/2021 37.5 34.0 - 46.6 % Final         Passed - eGFR is 30 or above and within 360 days    EGFR (African American)  Date Value Ref Range Status  09/14/2013 >60  Final   GFR calc Af Amer  Date Value Ref Range Status  07/26/2020 92 >59 mL/min/1.73 Final    Comment:    **In accordance with recommendations from the NKF-ASN Task force,**   [facility] is in the process of updating its eGFR calculation to the   2021 CKD-EPI creatinine equation that estimates kidney function   without a race variable.    EGFR (Non-African Amer.)  Date Value Ref Range Status  09/14/2013 >60  Final    Comment:    eGFR values <3m/min/1.73 m2 may be an indication of chronic kidney disease (CKD). Calculated eGFR is useful in patients with stable renal function. The eGFR calculation will not be reliable in acutely ill patients when serum creatinine is changing rapidly. It is not useful in  patients on  dialysis. The eGFR calculation may not be applicable to patients at the low and high extremes of body sizes, pregnant women, and vegetarians.    GFR, Estimated  Date Value Ref Range Status  09/14/2021 >60 >60 mL/min Final    Comment:    (NOTE) Calculated using the CKD-EPI Creatinine Equation (2021)    eGFR  Date Value Ref Range Status  12/12/2021 57 (L) >59 mL/min/1.73 Final         Passed - Patient is not pregnant      Passed - Valid encounter within last 12 months    Recent Outpatient Visits          1 week ago Insulin dependent type 2 diabetes mellitus ([place]   [place], Jolene T, NP   1 month ago Insulin dependent type 2 diabetes mellitus ([place]   [place], [place]   3 months ago Atrial fibrillation with RVR ([place]   [place] T, [place]   4 months ago Sepsis due to pneumonia ([facility]   [place] J[place] Faster NP  4 months ago Sepsis due to pneumonia [facility])   [place], Jolene T, NP      Future Appointments            In 1 month [name], [name], NP [employer], [facility]   In 2 months End, Harrell Gave, MD [facility], [place]

## 2022-02-05 ENCOUNTER — Other Ambulatory Visit: Payer: Self-pay | Admitting: Nurse Practitioner

## 2022-02-05 NOTE — Telephone Encounter (Signed)
Rx refused by office 01/22/22- change in dosing Requested Prescriptions  Pending Prescriptions Disp Refills  . MOUNJARO 5 MG/0.5ML Pen [Pharmacy Med Name: Mounjaro 5 MG/0.5ML Subcutaneous Solution Pen-injector] 4 mL 0    Sig: INJECT '5MG'$  INTO THE SKIN ONCE A WEEK     Off-Protocol Failed - 02/05/2022  9:12 AM      Failed - Medication not assigned to a protocol, review manually.      Passed - Valid encounter within last 12 months    Recent Outpatient Visits          2 weeks ago Insulin dependent type 2 diabetes mellitus ([place])   [facility] [place], Jolene T, NP   1 month ago Insulin dependent type 2 diabetes mellitus ([place])   [place] [name], Hardin T, NP   3 months ago Atrial fibrillation with RVR ([place])   [place], Jolene T, NP   4 months ago Sepsis due to pneumonia ([place])   [place], Jolene T, NP   4 months ago Sepsis due to pneumonia [facility])   [place], [name], NP      Future Appointments            In 1 month [name], [name], NP [employer], [facility]   In 2 months End, Harrell Gave, MD [facility], [place]

## 2022-02-10 ENCOUNTER — Other Ambulatory Visit: Payer: Self-pay

## 2022-02-10 DIAGNOSIS — D75839 Thrombocytosis, unspecified: Secondary | ICD-10-CM

## 2022-02-11 ENCOUNTER — Inpatient Hospital Stay: Payer: Medicare HMO | Attending: Oncology

## 2022-02-11 ENCOUNTER — Encounter: Payer: Self-pay | Admitting: Oncology

## 2022-02-11 ENCOUNTER — Inpatient Hospital Stay (HOSPITAL_BASED_OUTPATIENT_CLINIC_OR_DEPARTMENT_OTHER): Payer: Medicare HMO | Admitting: Oncology

## 2022-02-11 VITALS — BP 99/51 | HR 84 | Resp 16 | Wt 147.5 lb

## 2022-02-11 DIAGNOSIS — D75839 Thrombocytosis, unspecified: Secondary | ICD-10-CM

## 2022-02-11 DIAGNOSIS — J449 Chronic obstructive pulmonary disease, unspecified: Secondary | ICD-10-CM | POA: Diagnosis not present

## 2022-02-11 DIAGNOSIS — Z801 Family history of malignant neoplasm of trachea, bronchus and lung: Secondary | ICD-10-CM | POA: Diagnosis not present

## 2022-02-11 DIAGNOSIS — Z90710 Acquired absence of both cervix and uterus: Secondary | ICD-10-CM | POA: Insufficient documentation

## 2022-02-11 DIAGNOSIS — I110 Hypertensive heart disease with heart failure: Secondary | ICD-10-CM | POA: Insufficient documentation

## 2022-02-11 DIAGNOSIS — D72829 Elevated white blood cell count, unspecified: Secondary | ICD-10-CM | POA: Diagnosis present

## 2022-02-11 DIAGNOSIS — K219 Gastro-esophageal reflux disease without esophagitis: Secondary | ICD-10-CM | POA: Insufficient documentation

## 2022-02-11 DIAGNOSIS — E1140 Type 2 diabetes mellitus with diabetic neuropathy, unspecified: Secondary | ICD-10-CM | POA: Insufficient documentation

## 2022-02-11 DIAGNOSIS — I509 Heart failure, unspecified: Secondary | ICD-10-CM | POA: Insufficient documentation

## 2022-02-11 DIAGNOSIS — Z800 Family history of malignant neoplasm of digestive organs: Secondary | ICD-10-CM | POA: Insufficient documentation

## 2022-02-11 DIAGNOSIS — Z87891 Personal history of nicotine dependence: Secondary | ICD-10-CM | POA: Insufficient documentation

## 2022-02-11 DIAGNOSIS — Z853 Personal history of malignant neoplasm of breast: Secondary | ICD-10-CM | POA: Insufficient documentation

## 2022-02-11 LAB — CBC WITH DIFFERENTIAL/PLATELET
Abs Immature Granulocytes: 0.03 10*3/uL (ref 0.00–0.07)
Basophils Absolute: 0.1 10*3/uL (ref 0.0–0.1)
Basophils Relative: 1 %
Eosinophils Absolute: 0.4 10*3/uL (ref 0.0–0.5)
Eosinophils Relative: 4 %
HCT: 37.1 % (ref 36.0–46.0)
Hemoglobin: 12.1 g/dL (ref 12.0–15.0)
Immature Granulocytes: 0 %
Lymphocytes Relative: 30 %
Lymphs Abs: 3 10*3/uL (ref 0.7–4.0)
MCH: 31.6 pg (ref 26.0–34.0)
MCHC: 32.6 g/dL (ref 30.0–36.0)
MCV: 96.9 fL (ref 80.0–100.0)
Monocytes Absolute: 0.8 10*3/uL (ref 0.1–1.0)
Monocytes Relative: 8 %
Neutro Abs: 5.9 10*3/uL (ref 1.7–7.7)
Neutrophils Relative %: 57 %
Platelets: 341 10*3/uL (ref 150–400)
RBC: 3.83 MIL/uL — ABNORMAL LOW (ref 3.87–5.11)
RDW: 13 % (ref 11.5–15.5)
WBC: 10.3 10*3/uL (ref 4.0–10.5)
nRBC: 0 % (ref 0.0–0.2)

## 2022-02-11 LAB — COMPREHENSIVE METABOLIC PANEL
ALT: 17 U/L (ref 0–44)
AST: 20 U/L (ref 15–41)
Albumin: 3.9 g/dL (ref 3.5–5.0)
Alkaline Phosphatase: 63 U/L (ref 38–126)
Anion gap: 7 (ref 5–15)
BUN: 24 mg/dL — ABNORMAL HIGH (ref 8–23)
CO2: 28 mmol/L (ref 22–32)
Calcium: 8.9 mg/dL (ref 8.9–10.3)
Chloride: 100 mmol/L (ref 98–111)
Creatinine, Ser: 1.19 mg/dL — ABNORMAL HIGH (ref 0.44–1.00)
GFR, Estimated: 49 mL/min — ABNORMAL LOW (ref >60)
Glucose, Bld: 169 mg/dL — ABNORMAL HIGH (ref 70–99)
Potassium: 4.8 mmol/L (ref 3.5–5.1)
Sodium: 135 mmol/L (ref 135–145)
Total Bilirubin: 0.5 mg/dL (ref 0.3–1.2)
Total Protein: 7.9 g/dL (ref 6.5–8.1)

## 2022-02-11 NOTE — Progress Notes (Signed)
Hematology/Oncology Consult note [facility]  Telephone:([phone] [phone]  Patient Care Team: [name], NP as PCP - General (Nurse Practitioner) End, Harrell Gave, MD as PCP - Cardiology (Cardiology) Vickie Epley, MD as PCP - Electrophysiology (Cardiology) Erby Pian, MD as Referring Physician (Pulmonary Disease) Kandace Blitz, MD as Referring Physician (Pain Medicine) Rebekah Chesterfield, LCSW as Social Worker (Licensed Clinical Social Worker) Sindy Guadeloupe, MD as Consulting Physician (Oncology)   Name of the patient: Jordan Chan  [id number]  [birth year]   Date of visit: 02/11/22  Diagnosis-leukocytosis/neutrophilia likely reactive  Chief complaint/ Reason for visit-routine follow-up of leukocytosis  Heme/Onc history:  patient is a 71 year old female with a past medical history significant for COPD GERD insulin-dependent diabetes history of breast cancer in the remote past who has been referred to [place] for leukocytosis and thrombocytosis.Looking back at her CBC her white cell count fluctuates between 10-16 over the last 7 years.  More recently since the last 3 months her white count has been between 11-13.  There are times when her white count has gone up to 34 and patient thinks that she may have been on steroids.  Differential has mainly showed neutrophilia but also with some monocytosis and mild eosinophilia.  Patient also noted to have a platelet count mainly in the high 400s at least since 2015 with no clear rising trend.  Hemoglobin more recently has been close to 11   Results of blood work from 12/21/2020 were as follows: CBC showed white cell count of 13, H&H of 11.7/37.2 with an MCV of 89 and platelet count of 446.  Iron studies did show evidence of iron deficiency with elevated TIBC of 475, low iron saturation of 8%And low ferritin of 5 patient received 5 doses of Venofer   BCR ABL FISH testing, JAK2 mutation  analysis, flow cytometry wasUnremarkable.  Interval history-patient is doing well for her age.  Denies any specific complaints at this time.  Appetite and weight have remained stable  ECOG PS- 1 Pain scale- 0  Review of systems- Review of Systems  Constitutional:  Negative for chills, fever, malaise/fatigue and weight loss.  HENT:  Negative for congestion, ear discharge and nosebleeds.   Eyes:  Negative for blurred vision.  Respiratory:  Negative for cough, hemoptysis, sputum production, shortness of breath and wheezing.   Cardiovascular:  Negative for chest pain, palpitations, orthopnea and claudication.  Gastrointestinal:  Negative for abdominal pain, blood in stool, constipation, diarrhea, heartburn, melena, nausea and vomiting.  Genitourinary:  Negative for dysuria, flank pain, frequency, hematuria and urgency.  Musculoskeletal:  Negative for back pain, joint pain and myalgias.  Skin:  Negative for rash.  Neurological:  Negative for dizziness, tingling, focal weakness, seizures, weakness and headaches.  Endo/Heme/Allergies:  Does not bruise/bleed easily.  Psychiatric/Behavioral:  Negative for depression and suicidal ideas. The patient does not have insomnia.       Allergies  Allergen Reactions   Other Palpitations    IV steroids   Pain Patch [Menthol] Anaphylaxis   Avelox [Moxifloxacin Hcl In Nacl] Other (See Comments)    Upset stomach   Doxycycline Diarrhea   Erythromycin Nausea Only and Other (See Comments)    Can take a Z-Pak just fine Other reaction(s): Other (See Comments) Can take Z-Pak  Can take a Z-Pak just fine   Fentanyl Nausea Only and Rash   Moxifloxacin Hcl Other (See Comments)    Upset stomach Upset stomach   Oxycontin [Oxycodone]  Hives   Ozempic [Semaglutide] Nausea Only     Past Medical History:  Diagnosis Date   Arthritis    Asthma    Atrial fibrillation ([place])    Breast cancer ([place]) 1998   right breast ca with mastectomy and chemotherapy and  radiation   Bronchitis    CHF (congestive heart failure) ([place])    "with Morphine withdrawal"   COPD (chronic obstructive pulmonary disease) ([place])    Diabetes mellitus without complication ([place])    Diverticulitis    diverticulosis also   Dyspnea    Endometriosis    GERD (gastroesophageal reflux disease)    History of shingles 2000-2005   Hypercholesteremia    Hypertension    IBS (irritable bowel syndrome)    Low back pain    a. Implanted morphine/bupivicaine/clonidine pump.   Neuropathy    Orthopnea    Oxygen dependent    uses at night   Personal history of chemotherapy    Personal history of radiation therapy    Pneumonia    pneumonia 5-6 times, history of bronchitis also   Scoliosis    Sleep apnea    does not use cpap   Stroke ([place]) 2010   TIA, 10 years ago   Withdrawal from sedative drug ([place])    withdrawal from morphine when pump batteries died     Past Surgical History:  Procedure Laterality Date   ABDOMINAL HYSTERECTOMY  1987   BACK SURGERY     Tailbone removed following fracture   BREAST SURGERY Right    mastectomy   CARDIAC CATHETERIZATION     CATARACT EXTRACTION W/PHACO Right 05/19/2019   Procedure: CATARACT EXTRACTION PHACO AND INTRAOCULAR LENS PLACEMENT ([place]), RIGHT, DIABETIC;  Surgeon: Marchia Meiers, MD;  Location: [facility] ORS;  Service: Ophthalmology;  Laterality: Right;  Lot # [id number] H [place]: 00:43.8 CDE: 4.59   CATARACT EXTRACTION W/PHACO Left 06/16/2019   Procedure: CATARACT EXTRACTION PHACO AND INTRAOCULAR LENS PLACEMENT ([place]) LEFT VISION BLUE DIABETIC;  Surgeon: Marchia Meiers, MD;  Location: [facility] ORS;  Service: Ophthalmology;  Laterality: Left;  Lot #[id number] H [place]: 00:46.9 CDE: 6.53   COCCYX REMOVAL     COLONOSCOPY WITH PROPOFOL N/A 01/01/2017   Procedure: COLONOSCOPY WITH PROPOFOL;  Surgeon: Jonathon Bellows, MD;  Location: [facility] ENDOSCOPY;  Service: Endoscopy;  Laterality: N/A;   COLONOSCOPY WITH PROPOFOL N/A 12/11/2021   Procedure: COLONOSCOPY WITH PROPOFOL;   Surgeon: Lin Landsman, MD;  Location: [facility] ENDOSCOPY;  Service: Gastroenterology;  Laterality: N/A;   ELBOW ARTHROSCOPY WITH TENDON RECONSTRUCTION     ESOPHAGOGASTRODUODENOSCOPY (EGD) WITH PROPOFOL N/A 01/01/2017   Procedure: ESOPHAGOGASTRODUODENOSCOPY (EGD) WITH PROPOFOL;  Surgeon: Jonathon Bellows, MD;  Location: [facility] ENDOSCOPY;  Service: Endoscopy;  Laterality: N/A;   ESOPHAGOGASTRODUODENOSCOPY (EGD) WITH PROPOFOL N/A 12/11/2021   Procedure: ESOPHAGOGASTRODUODENOSCOPY (EGD) WITH PROPOFOL;  Surgeon: Lin Landsman, MD;  Location: [facility]COPY;  Service: Gastroenterology;  Laterality: N/A;   INTRATHECAL PUMP IMPLANT     LEFT HEART CATH AND CORONARY ANGIOGRAPHY N/A 04/28/2017   Procedure: LEFT HEART CATH AND CORONARY ANGIOGRAPHY;  Surgeon: [name], MD;  Location: [facility] LAB;  Service: Cardiovascular;  Laterality: N/A;   MASTECTOMY Right 06/1997   morphine pump  2011   PLANTAR FASCIA RELEASE     TRIGGER FINGER RELEASE      Social History   Socioeconomic History   Marital status: Unknown    Spouse name: Not on file   Number of children: Not on file   Years of education: Not on file  Highest education level: High school graduate  Occupational History   Occupation: retired  Tobacco Use   Smoking status: Former    Packs/day: 1.00    Years: 30.00    Total pack years: 30.00    Types: Cigarettes    Quit date: 04/30/2004    Years since quitting: 17.7   Smokeless tobacco: Never  Vaping Use   Vaping Use: Never used  Substance and Sexual Activity   Alcohol use: No   Drug use: No   Sexual activity: Not Currently    Birth control/protection: Post-menopausal  Other Topics Concern   Not on file  Social History Narrative   ** Merged History Encounter **       Social Determinants of Health   Financial Resource Strain: Low Risk  (12/16/2021)   Overall Financial Resource Strain (CARDIA)    Difficulty of Paying Living Expenses: Not hard at all  Food Insecurity:  No Food Insecurity (12/16/2021)   Hunger Vital Sign    Worried About Running Out of Food in the Last Year: Never true    Ran Out of Food in the Last Year: Never true  Transportation Needs: No Transportation Needs (12/16/2021)   PRAPARE - Hydrologist (Medical): No    Lack of Transportation (Non-Medical): No  Physical Activity: Insufficiently Active (12/16/2021)   Exercise Vital Sign    Days of Exercise per Week: 3 days    Minutes of Exercise per Session: 30 min  Stress: No Stress Concern Present (12/16/2021)   [place]    Feeling of Stress : Not at all  Social Connections: Moderately Isolated (12/16/2021)   Social Connection and Isolation Panel [NHANES]    Frequency of Communication with Friends and Family: More than three times a week    Frequency of Social Gatherings with Friends and Family: More than three times a week    Attends Religious Services: More than 4 times per year    Active Member of [employer] or Organizations: No    Attends Archivist Meetings: Never    Marital Status: Separated  Intimate Partner Violence: Not At Risk (12/16/2021)   Humiliation, Afraid, Rape, and Kick questionnaire    Fear of Current or Ex-Partner: No    Emotionally Abused: No    Physically Abused: No    Sexually Abused: No    Family History  Problem Relation Age of Onset   Cancer Mother 72       lung   Thyroid disease Mother    Cancer Father 51       Pancreatic   Hypertension Sister    Cancer Sister        "cancer on face"   Hyperlipidemia Sister    Osteoporosis Maternal Grandmother    Hyperlipidemia Son    Seizures Son    Cancer Paternal Grandmother        colon   Arthritis Paternal Grandfather    Breast cancer Neg Hx      Current Outpatient Medications:    albuterol (PROVENTIL HFA;VENTOLIN HFA) 108 (90 BASE) MCG/ACT inhaler, Inhale 2 puffs into the lungs every 6 (six) hours as needed  for wheezing or shortness of breath. , Disp: , Rfl:    apixaban (ELIQUIS) 5 MG TABS tablet, Take 1 tablet (5 mg total) by mouth 2 (two) times daily., Disp: 60 tablet, Rfl: 2   atorvastatin (LIPITOR) 20 MG tablet, Take 1 tablet (20 mg total) by mouth  daily., Disp: 90 tablet, Rfl: 4   Blood Glucose Monitoring Suppl (TRUE METRIX METER) w/Device KIT, Use to check blood sugar 4 times a day, Disp: 1 kit, Rfl: 4   cholecalciferol (VITAMIN D3) 25 MCG (1000 UNIT) tablet, Take 1,000 Units by mouth daily., Disp: , Rfl:    Cyanocobalamin 1000 MCG/ML KIT, Inject 1,000 mcg as directed every 30 (thirty) days., Disp: , Rfl:    diclofenac (VOLTAREN) 50 MG EC tablet, TAKE 1 TABLET THREE TIMES DAILY, Disp: 270 tablet, Rfl: 0   folic acid (FOLVITE) 1 MG tablet, TAKE 1 TABLET EVERY DAY, Disp: 90 tablet, Rfl: 0   furosemide (LASIX) 40 MG tablet, TAKE 1 TABLET EVERY DAY, Disp: 90 tablet, Rfl: 0   gabapentin (NEURONTIN) 800 MG tablet, Take 1 tablet (800 mg total) by mouth 3 (three) times daily., Disp: 270 tablet, Rfl: 4   insulin degludec (TRESIBA FLEXTOUCH) 100 UNIT/ML FlexTouch Pen, Inject 50 Units into the skin at bedtime., Disp: , Rfl:    metFORMIN (GLUCOPHAGE) 1000 MG tablet, TAKE 1 TABLET TWICE DAILY WITH MEALS, Disp: 180 tablet, Rfl: 4   metoprolol succinate (TOPROL-XL) 25 MG 24 hr tablet, TAKE 1/2 TABLET EVERY DAY, Disp: 45 tablet, Rfl: 0   montelukast (SINGULAIR) 10 MG tablet, Take 10 mg by mouth at bedtime., Disp: , Rfl:    PAIN MANAGEMENT INTRATHECAL, IT, PUMP, 1 each by Intrathecal route. Intrathecal (IT) medication:  Morphine Patient does not remember current. Adjusted every 2.5 months at [facility] in [place]., Disp: , Rfl:    pantoprazole (PROTONIX) 40 MG tablet, Take 1 tablet (40 mg total) by mouth daily., Disp: 90 tablet, Rfl: 4   tirzepatide (MOUNJARO) 7.5 MG/0.5ML Pen, Inject 7.5 mg into the skin once a week., Disp: 6 mL, Rfl: 1   TRELEGY ELLIPTA 100-62.5-25 MCG/ACT AEPB, INHALE 1  PUFF INTO THE LUNGS DAILY, Disp: 180 each, Rfl: 4   venlafaxine XR (EFFEXOR-XR) 150 MG 24 hr capsule, TAKE 1 CAPSULE EVERY DAY, Disp: 90 capsule, Rfl: 3   ACCU-CHEK AVIVA PLUS test strip, TEST THREE TIMES DAILY, Disp: 300 strip, Rfl: 3   Accu-Chek Softclix Lancets lancets, TEST BLOOD SUGAR THREE TIMES DAILY, Disp: 300 each, Rfl: 1   Alcohol Swabs (DROPSAFE ALCOHOL PREP) 70 % PADS, USE TWICE DAILY  WITH  SUGAR  CHECKS, Disp: 200 each, Rfl: 2   cyclobenzaprine (FLEXERIL) 5 MG tablet, Take 5 mg by mouth 2 (two) times daily as needed for muscle spasms. Takes very rarely (Patient not taking: Reported on 02/11/2022), Disp: , Rfl:    losartan (COZAAR) 25 MG tablet, Take 0.5 tablets (12.5 mg total) by mouth daily., Disp: 45 tablet, Rfl: 3   ondansetron (ZOFRAN) 4 MG tablet, Take 1 tablet (4 mg total) by mouth every 8 (eight) hours as needed for nausea or vomiting. (Patient not taking: Reported on 02/11/2022), Disp: 40 tablet, Rfl: 4  Physical exam:  Vitals:   02/11/22 1000  BP: (!) 99/51  Pulse: 84  Resp: 16  SpO2: 95%  Weight: 147 lb 8 oz (66.9 kg)   Physical Exam Constitutional:      General: She is not in acute distress. Cardiovascular:     Rate and Rhythm: Normal rate and regular rhythm.     Heart sounds: Normal heart sounds.  Pulmonary:     Effort: Pulmonary effort is normal.     Breath sounds: Normal breath sounds.  Abdominal:     General: Bowel sounds are normal.     Palpations: Abdomen  is soft.  Skin:    General: Skin is warm and dry.  Neurological:     Mental Status: She is alert and oriented to person, place, and time.         Latest Ref Rng & Units 02/11/2022    9:20 AM  CMP  Glucose 70 - 99 mg/dL 169   BUN 8 - 23 mg/dL 24   Creatinine 0.44 - 1.00 mg/dL 1.19   Sodium 135 - 145 mmol/L 135   Potassium 3.5 - 5.1 mmol/L 4.8   Chloride 98 - 111 mmol/L 100   CO2 22 - 32 mmol/L 28   Calcium 8.9 - 10.3 mg/dL 8.9   Total Protein 6.5 - 8.1 g/dL 7.9   Total Bilirubin 0.3 - 1.2  mg/dL 0.5   Alkaline Phos 38 - 126 U/L 63   AST 15 - 41 U/L 20   ALT 0 - 44 U/L 17       Latest Ref Rng & Units 02/11/2022    9:20 AM  CBC  WBC 4.0 - 10.5 K/uL 10.3   Hemoglobin 12.0 - 15.0 g/dL 12.1   Hematocrit 36.0 - 46.0 % 37.1   Platelets 150 - 400 K/uL 341      Assessment and plan- Patient is a 71 y.o. female referred for leukocytosis likely reactive  Patient has had mild leukocytosis with mainly neutrophilia and lymphocytosis over the last 1 year.  Presently her white cell count is normal at 10.3 with a normal differential.  Hemoglobin and platelet count normal.  Work-up for myeloproliferative disorder in the past was negative.  Given the stability of counts I will repeat her CBC with differential in 6 months in 1 year and see her back in 1 year   Visit Diagnosis 1. Thrombocytosis   2. Leukocytosis, unspecified type      Dr. Randa Evens, MD, MPH [facility] [phone] 02/11/2022 3:58 PM

## 2022-02-12 DIAGNOSIS — Z5181 Encounter for therapeutic drug level monitoring: Secondary | ICD-10-CM | POA: Diagnosis not present

## 2022-02-12 DIAGNOSIS — Z978 Presence of other specified devices: Secondary | ICD-10-CM | POA: Diagnosis not present

## 2022-02-12 DIAGNOSIS — Z79899 Other long term (current) drug therapy: Secondary | ICD-10-CM | POA: Diagnosis not present

## 2022-02-12 DIAGNOSIS — M47816 Spondylosis without myelopathy or radiculopathy, lumbar region: Secondary | ICD-10-CM | POA: Diagnosis not present

## 2022-02-12 DIAGNOSIS — M542 Cervicalgia: Secondary | ICD-10-CM | POA: Diagnosis not present

## 2022-02-12 DIAGNOSIS — M792 Neuralgia and neuritis, unspecified: Secondary | ICD-10-CM | POA: Diagnosis not present

## 2022-02-12 DIAGNOSIS — G893 Neoplasm related pain (acute) (chronic): Secondary | ICD-10-CM | POA: Diagnosis not present

## 2022-02-12 DIAGNOSIS — M5442 Lumbago with sciatica, left side: Secondary | ICD-10-CM | POA: Diagnosis not present

## 2022-03-03 ENCOUNTER — Ambulatory Visit: Payer: Self-pay | Admitting: *Deleted

## 2022-03-03 NOTE — Telephone Encounter (Signed)
  Chief Complaint: requesting to change mounjaro back to trulicity due to side effects  Symptoms: nausea after eating , no appetite. Blood sugar this am 0800 142 after eating lunch today 296. Denies sx of hyperglycemia  Frequency: na Pertinent Negatives: Patient denies na Disposition: '[]'$ ED /'[]'$ Urgent Care (no appt availability in office) / '[]'$ Appointment(In office/virtual)/ '[]'$  [facility]/ '[]'$ Home Care/ '[]'$ Refused Recommended Disposition /'[]'$ [facility]/ '[x]'$  Follow-up with PCP Additional Notes:   Please advise if patient can be started back on trulicity.      Reason for Disposition  Caller wants to use a complementary or alternative medicine  Answer Assessment - Initial Assessment Questions 1. NAME of MEDICINE: "What medicine(s) are you calling about?"     mounjaro 2. QUESTION: "What is your question?" (e.g., double dose of medicine, side effect)     Can patient switch back to trulicity due to side effects of no appetite, nausea after eating .  3. PRESCRIBER: "Who prescribed the medicine?" Reason: if prescribed by specialist, call should be referred to that group.     PCP 4. SYMPTOMS: "Do you have any symptoms?" If Yes, ask: "What symptoms are you having?"  "How bad are the symptoms (e.g., mild, moderate, severe)     Yes nausea after eating , no appetite 5. PREGNANCY:  "Is there any chance that you are pregnant?" "When was your last menstrual period?"     na  Protocols used: Medication Question Call-A-AH

## 2022-03-03 NOTE — Telephone Encounter (Signed)
Summary: medication reaction   Pt has no appetite when taking tirzepatide (MOUNJARO) 7.5 MG/0.5ML Pen / pt also experiences nausea and stomach pain / she would like to switch back to Trulicity / please advise     Called pt to discuss sx. LM on VM to call back to [phone]

## 2022-03-03 NOTE — Telephone Encounter (Signed)
Summary: medication reaction   Pt has no appetite when taking tirzepatide (MOUNJARO) 7.5 MG/0.5ML Pen / pt also experiences nausea and stomach pain / she would like to switch back to Trulicity / please advise       Called patient to review sx of medication. No answer, LVMTCB [phone].

## 2022-03-04 MED ORDER — TRULICITY 4.5 MG/0.5ML ~~LOC~~ SOAJ: 4.5000 mg | SUBCUTANEOUS | 4 refills | Status: DC

## 2022-03-04 NOTE — Telephone Encounter (Signed)
Patient was notified and verbalized understanding and has no further questions. Patient says she wanted to thank [facility] for sending her prescription.

## 2022-03-10 DIAGNOSIS — R0609 Other forms of dyspnea: Secondary | ICD-10-CM | POA: Diagnosis not present

## 2022-03-10 DIAGNOSIS — J449 Chronic obstructive pulmonary disease, unspecified: Secondary | ICD-10-CM | POA: Diagnosis not present

## 2022-03-10 DIAGNOSIS — I480 Paroxysmal atrial fibrillation: Secondary | ICD-10-CM | POA: Diagnosis not present

## 2022-03-11 ENCOUNTER — Other Ambulatory Visit: Payer: Self-pay | Admitting: Nurse Practitioner

## 2022-03-11 DIAGNOSIS — Z1231 Encounter for screening mammogram for malignant neoplasm of breast: Secondary | ICD-10-CM

## 2022-03-14 ENCOUNTER — Ambulatory Visit (INDEPENDENT_AMBULATORY_CARE_PROVIDER_SITE_OTHER): Payer: Medicare HMO | Admitting: Nurse Practitioner

## 2022-03-14 ENCOUNTER — Encounter: Payer: Self-pay | Admitting: Nurse Practitioner

## 2022-03-14 VITALS — BP 100/62 | HR 81 | Temp 97.7°F | Ht 58.5 in | Wt 145.2 lb

## 2022-03-14 DIAGNOSIS — R110 Nausea: Secondary | ICD-10-CM | POA: Diagnosis not present

## 2022-03-14 MED ORDER — ONDANSETRON HCL 4 MG PO TABS
4.0000 mg | ORAL_TABLET | Freq: Three times a day (TID) | ORAL | 0 refills | Status: DC | PRN
Start: 2022-03-14 — End: 2023-03-14

## 2022-03-14 NOTE — Progress Notes (Signed)
BP 100/62   Pulse 81   Temp 97.7 F (36.5 C) (Oral)   Ht 4' 10.5" (1.486 m)   Wt 145 lb 3.2 oz (65.9 kg)   LMP  (LMP Unknown)   SpO2 93%   BMI 29.83 kg/m    Subjective:    [name], female    DOB: 1951-05-24, 71 y.o.   MRN: [id number]  HPI: Jordan Chan is a 71 y.o. female  Chief Complaint  Patient presents with   Nausea    Patient is here for Nausea. Patient says it can be anything that she is eating and she will become nauseated and says sometimes she eats her stomach will just hurt and hurt. Patient denies having any vomiting. Patient says she first noticed it about 4 weeks now.    NAUSEA Started 4-5 weeks ago after starting Mounjaro.  We have discontinued this 03/03/22 and placed her back on Trulicity which she tolerated.  Is having pain in abdomen after eating, this is aggravating. Has been feeling bloated after eating.  No well water at home.  Has not been ill recently. Duration:weeks Onset: sudden Severity: 3/10 Quality: dull and aching Location:  diffuse  Episode duration: 4-5 weeks Radiation: no Frequency: intermittent Alleviating factors: nothing Aggravating factors: worse after eating Status: fluctuating Treatments attempted: none Fever: no Nausea: yes Vomiting: yes Weight loss:  with Mounjaro Decreased appetite: yes Diarrhea: no Constipation: yes -- has BM 2-3 times a week Blood in stool: no Heartburn: no Jaundice: no Rash: no Dysuria/urinary frequency: no Hematuria: no History of sexually transmitted disease: no Recurrent NSAID use: no   Relevant past medical, surgical, family and social history reviewed and updated as indicated. Interim medical history since our last visit reviewed. Allergies and medications reviewed and updated.  Review of Systems  Constitutional:  Negative for activity change, appetite change, diaphoresis, fatigue and fever.  Respiratory:  Negative for cough, chest tightness and shortness of  breath.   Cardiovascular:  Negative for chest pain, palpitations and leg swelling.  Gastrointestinal:  Positive for abdominal pain, constipation, nausea and vomiting. Negative for abdominal distention, blood in stool, diarrhea and rectal pain.  Neurological: Negative.   Psychiatric/Behavioral: Negative.      Per HPI unless specifically indicated above     Objective:    BP 100/62   Pulse 81   Temp 97.7 F (36.5 C) (Oral)   Ht 4' 10.5" (1.486 m)   Wt 145 lb 3.2 oz (65.9 kg)   LMP  (LMP Unknown)   SpO2 93%   BMI 29.83 kg/m   Wt Readings from Last 3 Encounters:  03/14/22 145 lb 3.2 oz (65.9 kg)  02/11/22 147 lb 8 oz (66.9 kg)  01/22/22 151 lb 9.6 oz (68.8 kg)    Physical Exam Vitals and nursing note reviewed.  Constitutional:      General: She is awake. She is not in acute distress.    Appearance: She is well-developed and well-groomed. She is not ill-appearing or toxic-appearing.  HENT:     Head: Normocephalic.     Right Ear: Hearing normal.     Left Ear: Hearing normal.  Eyes:     General: Lids are normal.        Right eye: No discharge.        Left eye: No discharge.     Conjunctiva/sclera: Conjunctivae normal.     Pupils: Pupils are equal, round, and reactive to light.  Neck:     Thyroid:  No thyromegaly.     Vascular: No carotid bruit.  Cardiovascular:     Rate and Rhythm: Normal rate and regular rhythm.     Heart sounds: Normal heart sounds. No murmur heard.    No gallop.  Pulmonary:     Effort: Pulmonary effort is normal. No accessory muscle usage or respiratory distress.     Breath sounds: Normal breath sounds.  Abdominal:     General: Bowel sounds are normal. There is no distension.     Palpations: Abdomen is soft. There is no hepatomegaly.     Tenderness: There is no abdominal tenderness. There is no right CVA tenderness, left CVA tenderness, guarding or rebound. Negative signs include Murphy's sign.     Hernia: No hernia is present.     Comments: Pain  pump present to right quadrant.  Musculoskeletal:     Cervical back: Normal range of motion and neck supple.     Right lower leg: No edema.     Left lower leg: No edema.  Skin:    General: Skin is warm and dry.  Neurological:     Mental Status: She is alert and oriented to person, place, and time.     Deep Tendon Reflexes: Reflexes are normal and symmetric.     Reflex Scores:      Brachioradialis reflexes are 2+ on the right side and 2+ on the left side.      Patellar reflexes are 2+ on the right side and 2+ on the left side. Psychiatric:        Attention and Perception: Attention normal.        Mood and Affect: Mood normal.        Speech: Speech normal.        Behavior: Behavior normal. Behavior is cooperative.        Thought Content: Thought content normal.     Results for orders placed or performed in visit on 02/11/22  Comprehensive metabolic panel  Result Value Ref Range   Sodium 135 135 - 145 mmol/L   Potassium 4.8 3.5 - 5.1 mmol/L   Chloride 100 98 - 111 mmol/L   CO2 28 22 - 32 mmol/L   Glucose, Bld 169 (H) 70 - 99 mg/dL   BUN 24 (H) 8 - 23 mg/dL   Creatinine, Ser 1.19 (H) 0.44 - 1.00 mg/dL   Calcium 8.9 8.9 - 10.3 mg/dL   Total Protein 7.9 6.5 - 8.1 g/dL   Albumin 3.9 3.5 - 5.0 g/dL   AST 20 15 - 41 U/L   ALT 17 0 - 44 U/L   Alkaline Phosphatase 63 38 - 126 U/L   Total Bilirubin 0.5 0.3 - 1.2 mg/dL   GFR, Estimated 49 (L) >60 mL/min   Anion gap 7 5 - 15  CBC with Differential/Platelet  Result Value Ref Range   WBC 10.3 4.0 - 10.5 K/uL   RBC 3.83 (L) 3.87 - 5.11 MIL/uL   Hemoglobin 12.1 12.0 - 15.0 g/dL   HCT 37.1 36.0 - 46.0 %   MCV 96.9 80.0 - 100.0 fL   MCH 31.6 26.0 - 34.0 pg   MCHC 32.6 30.0 - 36.0 g/dL   RDW 13.0 11.5 - 15.5 %   Platelets 341 150 - 400 K/uL   nRBC 0.0 0.0 - 0.2 %   Neutrophils Relative % 57 %   Neutro Abs 5.9 1.7 - 7.7 K/uL   Lymphocytes Relative 30 %   Lymphs Abs 3.0 0.7 - 4.0  K/uL   Monocytes Relative 8 %   Monocytes Absolute  0.8 0.1 - 1.0 K/uL   Eosinophils Relative 4 %   Eosinophils Absolute 0.4 0.0 - 0.5 K/uL   Basophils Relative 1 %   Basophils Absolute 0.1 0.0 - 0.1 K/uL   Immature Granulocytes 0 %   Abs Immature Granulocytes 0.03 0.00 - 0.07 K/uL      Assessment & Plan:   Problem List Items Addressed This Visit       Other   Nausea - Primary    Started 4-5 weeks ago with Mounjaro initiation, we have placed her back on Trulicity which she tolerated in past, but still having nausea.  Check Lipase and Amylase today + CMP, CBC, TSH, GGT, and H. Pylori testing.  Sent in Zofran for nausea.  May need to take her off Trulicity or reduce if ongoing -- will review labs and determine next steps.  No red flags today.  Recommend she start Metamucil gummies 2 a day for constipation.  Return on 03/24/22.      Relevant Orders   H. pylori antigen, stool   CBC with Differential/Platelet   Comprehensive metabolic panel   TSH   Lipase   Amylase   Gamma GT     Follow up plan: Return for as scheduled 03/24/22.

## 2022-03-14 NOTE — Assessment & Plan Note (Signed)
Started 4-5 weeks ago with Mounjaro initiation, we have placed her back on Trulicity which she tolerated in past, but still having nausea.  Check Lipase and Amylase today + CMP, CBC, TSH, GGT, and H. Pylori testing.  Sent in Zofran for nausea.  May need to take her off Trulicity or reduce if ongoing -- will review labs and determine next steps.  No red flags today.  Recommend she start Metamucil gummies 2 a day for constipation.  Return on 03/24/22.

## 2022-03-14 NOTE — Patient Instructions (Addendum)
Start Metamucil gummies twice a day.  Vomiting, Adult Vomiting is when stomach contents forcefully come out of the mouth. Many people notice nausea before vomiting. Vomiting can make you feel weak and cause you to become dehydrated. Dehydration can make you feel tired and thirsty, cause you to have a dry mouth, and decrease how often you urinate. Older adults and people who have other diseases or a weak body defense system (immune system) are at higher risk for dehydration. It is important to treat vomiting as told by your health care provider. Follow these instructions at home:  Watch your symptoms for any changes. Tell your health care provider about them. Eating and drinking     Follow these recommendations as told by your health care provider: Take an oral rehydration solution (ORS). This is a drink that is sold at pharmacies and retail stores. Eat bland, easy-to-digest foods in small amounts as you are able. These foods include bananas, applesauce, rice, lean meats, toast, and crackers. Drink clear fluids slowly and in small amounts as you are able. Clear fluids include water, ice chips, low-calorie sports drinks, and fruit juice that has water added (diluted fruit juice). Avoid drinking fluids that contain a lot of sugar or caffeine, such as energy drinks, sports drinks, and soda. Avoid alcohol. Avoid spicy or fatty foods.  General instructions Wash your hands often using soap and water for at least 20 seconds. If soap and water are not available, use hand sanitizer. Make sure that everyone in your household washes their hands frequently. Take over-the-counter and prescription medicines only as told by your health care provider. Rest at home while you recover. Watch your condition for any changes. Keep all follow-up visits. This is important. Contact a health care provider if: Your vomiting gets worse. You have new symptoms. You have a fever. You cannot drink fluids without  vomiting. You feel light-headed or dizzy. You have a headache. You have muscle cramps. You have a rash. You have pain while urinating. Get help right away if: You have pain in your chest, neck, arm, or jaw. Your heart is beating very quickly. You have trouble breathing or you are breathing very quickly. You feel extremely weak or you faint. Your skin feels cold and clammy. You feel confused. You have persistent vomiting. You have vomit that is bright red or looks like black coffee grounds. You have stools (feces) that are bloody or black, or stools that look like tar. You have a severe headache, a stiff neck, or both. You have severe pain, cramping, or bloating in your abdomen. You have signs of dehydration, such as: Dark urine, very little urine, or no urine. Cracked lips. Dry mouth. Sunken eyes. Sleepiness. Weakness. These symptoms may be an emergency. Get help right away. Call 911. Do not wait to see if the symptoms will go away. Do not drive yourself to the hospital. Summary Vomiting is when stomach contents forcefully come out of the mouth. Vomiting can cause you to become dehydrated. It is important to treat vomiting as told by your health care provider. Follow your health care provider's instructions about eating and drinking. Wash your hands often using soap and water for at least 20 seconds. If soap and water are not available, use hand sanitizer. Watch your condition for any changes and for signs of dehydration. Keep all follow-up visits. This is important. This information is not intended to replace advice given to you by your health care provider. Make sure you discuss any questions  you have with your health care provider. Document Revised: 01/25/2021 Document Reviewed: 01/25/2021 Elsevier Patient Education  [place].

## 2022-03-15 ENCOUNTER — Telehealth: Payer: Self-pay | Admitting: Nurse Practitioner

## 2022-03-15 DIAGNOSIS — N1832 Chronic kidney disease, stage 3b: Secondary | ICD-10-CM

## 2022-03-15 DIAGNOSIS — E875 Hyperkalemia: Secondary | ICD-10-CM

## 2022-03-15 LAB — CBC WITH DIFFERENTIAL/PLATELET
Basophils Absolute: 0.1 10*3/uL (ref 0.0–0.2)
Basos: 1 %
EOS (ABSOLUTE): 0.5 10*3/uL — ABNORMAL HIGH (ref 0.0–0.4)
Eos: 5 %
Hematocrit: 36 % (ref 34.0–46.6)
Hemoglobin: 11.7 g/dL (ref 11.1–15.9)
Immature Grans (Abs): 0 10*3/uL (ref 0.0–0.1)
Immature Granulocytes: 0 %
Lymphocytes Absolute: 3 10*3/uL (ref 0.7–3.1)
Lymphs: 29 %
MCH: 31 pg (ref 26.6–33.0)
MCHC: 32.5 g/dL (ref 31.5–35.7)
MCV: 96 fL (ref 79–97)
Monocytes Absolute: 0.8 10*3/uL (ref 0.1–0.9)
Monocytes: 8 %
Neutrophils Absolute: 6 10*3/uL (ref 1.4–7.0)
Neutrophils: 57 %
Platelets: 418 10*3/uL (ref 150–450)
RBC: 3.77 x10E6/uL (ref 3.77–5.28)
RDW: 12.4 % (ref 11.7–15.4)
WBC: 10.4 10*3/uL (ref 3.4–10.8)

## 2022-03-15 LAB — COMPREHENSIVE METABOLIC PANEL
ALT: 12 IU/L (ref 0–32)
AST: 15 IU/L (ref 0–40)
Albumin/Globulin Ratio: 1.3 (ref 1.2–2.2)
Albumin: 4.1 g/dL (ref 3.8–4.8)
Alkaline Phosphatase: 69 IU/L (ref 44–121)
BUN/Creatinine Ratio: 18 (ref 12–28)
BUN: 27 mg/dL (ref 8–27)
Bilirubin Total: 0.2 mg/dL (ref 0.0–1.2)
CO2: 27 mmol/L (ref 20–29)
Calcium: 9.5 mg/dL (ref 8.7–10.3)
Chloride: 96 mmol/L (ref 96–106)
Creatinine, Ser: 1.54 mg/dL — ABNORMAL HIGH (ref 0.57–1.00)
Globulin, Total: 3.1 g/dL (ref 1.5–4.5)
Glucose: 120 mg/dL — ABNORMAL HIGH (ref 70–99)
Potassium: 6.5 mmol/L — ABNORMAL HIGH (ref 3.5–5.2)
Sodium: 139 mmol/L (ref 134–144)
Total Protein: 7.2 g/dL (ref 6.0–8.5)
eGFR: 36 mL/min/{1.73_m2} — ABNORMAL LOW (ref >59)

## 2022-03-15 LAB — TSH: TSH: 4.33 u[IU]/mL (ref 0.450–4.500)

## 2022-03-15 LAB — AMYLASE: Amylase: 78 U/L (ref 31–110)

## 2022-03-15 LAB — LIPASE: Lipase: 65 U/L (ref 14–85)

## 2022-03-15 LAB — GAMMA GT: GGT: 12 IU/L (ref 0–60)

## 2022-03-15 NOTE — Telephone Encounter (Signed)
Spoke to patient on telephone and reviewed labs: - Potassium labs show elevation at 6.5.  Recommend she hold her Losartan over the weekend and take an extra Lasix daily (taking 40 MG daily, take 60 MG over weekend) + decrease foods high in potassium. - Kidney function declined from previous check, recommend she increase water intake over the weekend. - Remainder of labs stable, including liver and pancreas function.  If worsening symptoms over weekend recommend she head to ER setting immediately. - Plan to recheck labs on Monday, will have staff call to schedule. She reported understanding of plan and stated appreciation for call.

## 2022-03-15 NOTE — Progress Notes (Signed)
Review telephone note dated 03/15/22.

## 2022-03-17 ENCOUNTER — Other Ambulatory Visit: Payer: Medicare HMO

## 2022-03-17 DIAGNOSIS — E875 Hyperkalemia: Secondary | ICD-10-CM | POA: Diagnosis not present

## 2022-03-17 DIAGNOSIS — N1832 Chronic kidney disease, stage 3b: Secondary | ICD-10-CM | POA: Diagnosis not present

## 2022-03-17 DIAGNOSIS — R110 Nausea: Secondary | ICD-10-CM | POA: Diagnosis not present

## 2022-03-17 NOTE — Telephone Encounter (Signed)
Pt scheduled  

## 2022-03-18 LAB — BASIC METABOLIC PANEL
BUN/Creatinine Ratio: 17 (ref 12–28)
BUN: 24 mg/dL (ref 8–27)
CO2: 26 mmol/L (ref 20–29)
Calcium: 9.5 mg/dL (ref 8.7–10.3)
Chloride: 99 mmol/L (ref 96–106)
Creatinine, Ser: 1.45 mg/dL — ABNORMAL HIGH (ref 0.57–1.00)
Glucose: 104 mg/dL — ABNORMAL HIGH (ref 70–99)
Potassium: 5.3 mmol/L — ABNORMAL HIGH (ref 3.5–5.2)
Sodium: 140 mmol/L (ref 134–144)
eGFR: 39 mL/min/{1.73_m2} — ABNORMAL LOW (ref >59)

## 2022-03-18 NOTE — Progress Notes (Signed)
Good morning, please let Jordan Chan know her labs have returned.  Kidney function continues to show some low level then baseline, but slight improvement.  Potassium level remains a little elevated, but trending down towards normal from 6.5 to now 5.3.  I want her to continue taking the extra 20 MG of Lasix daily as she did over weekend -- for the next week.  We will recheck on the 21st at visit.  Also ensure good hydration with water at home and decrease potassium rich foods like dried fruit, bananas, mangoes, orange juice.  Any questions? Keep being amazing!!  Thank you for allowing me to participate in your care.  I appreciate you. Kindest regards, Karl Knarr

## 2022-03-19 LAB — H. PYLORI ANTIGEN, STOOL: H pylori Ag, Stl: NEGATIVE

## 2022-03-19 NOTE — Progress Notes (Signed)
Please let Infant know her stool sample returned negative!!  [employer].  No bacteria.  Keep on with current plan of care and will see her on Monday.  Love you!!

## 2022-03-21 DIAGNOSIS — H43813 Vitreous degeneration, bilateral: Secondary | ICD-10-CM | POA: Diagnosis not present

## 2022-03-21 DIAGNOSIS — M3501 Sicca syndrome with keratoconjunctivitis: Secondary | ICD-10-CM | POA: Diagnosis not present

## 2022-03-21 DIAGNOSIS — E119 Type 2 diabetes mellitus without complications: Secondary | ICD-10-CM | POA: Diagnosis not present

## 2022-03-21 LAB — HM DIABETES EYE EXAM

## 2022-03-22 NOTE — Patient Instructions (Signed)

## 2022-03-24 ENCOUNTER — Ambulatory Visit (INDEPENDENT_AMBULATORY_CARE_PROVIDER_SITE_OTHER): Payer: Medicare HMO | Admitting: Nurse Practitioner

## 2022-03-24 ENCOUNTER — Encounter: Payer: Self-pay | Admitting: Nurse Practitioner

## 2022-03-24 VITALS — BP 95/59 | HR 83 | Temp 98.3°F | Ht 58.5 in | Wt 145.0 lb

## 2022-03-24 DIAGNOSIS — R110 Nausea: Secondary | ICD-10-CM

## 2022-03-24 DIAGNOSIS — E538 Deficiency of other specified B group vitamins: Secondary | ICD-10-CM | POA: Diagnosis not present

## 2022-03-24 DIAGNOSIS — K219 Gastro-esophageal reflux disease without esophagitis: Secondary | ICD-10-CM

## 2022-03-24 DIAGNOSIS — Z6830 Body mass index (BMI) 30.0-30.9, adult: Secondary | ICD-10-CM

## 2022-03-24 DIAGNOSIS — I700 Atherosclerosis of aorta: Secondary | ICD-10-CM | POA: Diagnosis not present

## 2022-03-24 DIAGNOSIS — E785 Hyperlipidemia, unspecified: Secondary | ICD-10-CM | POA: Diagnosis not present

## 2022-03-24 DIAGNOSIS — N1831 Chronic kidney disease, stage 3a: Secondary | ICD-10-CM

## 2022-03-24 DIAGNOSIS — E1140 Type 2 diabetes mellitus with diabetic neuropathy, unspecified: Secondary | ICD-10-CM

## 2022-03-24 DIAGNOSIS — E119 Type 2 diabetes mellitus without complications: Secondary | ICD-10-CM

## 2022-03-24 DIAGNOSIS — I4891 Unspecified atrial fibrillation: Secondary | ICD-10-CM

## 2022-03-24 DIAGNOSIS — I152 Hypertension secondary to endocrine disorders: Secondary | ICD-10-CM | POA: Diagnosis not present

## 2022-03-24 DIAGNOSIS — J449 Chronic obstructive pulmonary disease, unspecified: Secondary | ICD-10-CM | POA: Diagnosis not present

## 2022-03-24 DIAGNOSIS — E6609 Other obesity due to excess calories: Secondary | ICD-10-CM

## 2022-03-24 DIAGNOSIS — Z978 Presence of other specified devices: Secondary | ICD-10-CM

## 2022-03-24 DIAGNOSIS — G894 Chronic pain syndrome: Secondary | ICD-10-CM

## 2022-03-24 DIAGNOSIS — E66811 Obesity, class 1: Secondary | ICD-10-CM

## 2022-03-24 DIAGNOSIS — G4733 Obstructive sleep apnea (adult) (pediatric): Secondary | ICD-10-CM

## 2022-03-24 DIAGNOSIS — E559 Vitamin D deficiency, unspecified: Secondary | ICD-10-CM

## 2022-03-24 DIAGNOSIS — Z794 Long term (current) use of insulin: Secondary | ICD-10-CM

## 2022-03-24 DIAGNOSIS — I5022 Chronic systolic (congestive) heart failure: Secondary | ICD-10-CM

## 2022-03-24 DIAGNOSIS — F324 Major depressive disorder, single episode, in partial remission: Secondary | ICD-10-CM | POA: Diagnosis not present

## 2022-03-24 DIAGNOSIS — E1159 Type 2 diabetes mellitus with other circulatory complications: Secondary | ICD-10-CM | POA: Diagnosis not present

## 2022-03-24 DIAGNOSIS — E1169 Type 2 diabetes mellitus with other specified complication: Secondary | ICD-10-CM | POA: Diagnosis not present

## 2022-03-24 LAB — BAYER DCA HB A1C WAIVED: HB A1C (BAYER DCA - WAIVED): 7.6 % — ABNORMAL HIGH (ref 4.8–5.6)

## 2022-03-24 MED ORDER — CYANOCOBALAMIN 1000 MCG/ML IJ SOLN
1000.0000 ug | Freq: Once | INTRAMUSCULAR | Status: AC
  Administered 2022-03-24: 1000 ug via INTRAMUSCULAR

## 2022-03-24 NOTE — Assessment & Plan Note (Signed)
Chronic, stable with BP well below goal (on lower side, monitor closely).  Recommend she continue to monitor BP at home regularly.  Focus on DASH diet.  Continue current medication regimen and adjust as needed, refills sent as needed.  CMP today.  Return in 3 months.

## 2022-03-24 NOTE — Assessment & Plan Note (Signed)
Chronic, stable.  Recheck Vitamin D today and adjust supplement as needed.

## 2022-03-24 NOTE — Assessment & Plan Note (Signed)
Ongoing and stable.  Continue Losartan at low dose for heart and kidney protection.  Check CMP today.  Will refer to nephrology if any worsening in future. 

## 2022-03-24 NOTE — Progress Notes (Signed)
BP (!) 95/59   Pulse 83   Temp 98.3 F (36.8 C) (Oral)   Wt 145 lb (65.8 kg)   LMP  (LMP Unknown)   SpO2 99%   BMI 29.79 kg/m    Subjective:    [name], female    DOB: October 17, 1950, 71 y.o.   MRN: [id number]  HPI: Jordan Chan is a 71 y.o. female  Chief Complaint  Patient presents with   Diabetes    Patient recent Diabetic Eye Exam was requested at today's visit.    Hyperlipidemia   Hypertension   COPD   Pain   Nausea    Patient says her nausea has gotten better and almost completely gone.    DIABETES Last A1c was 7.6% in May, tried [facility] which caused ADR. Continues on Metformin 9794 MG BID, Trulicity 4.5 MG weekly, and Tresiba 50 units at HS. Jardiance made her sick in past and Mounjaro gave her severe diarrhea and abdominal pain.  Works with [employer] team and provider.   Continues on monthly B12 shots for low levels.  Followed by hematology in past, last saw 02/11/22, received 5 iron infusions total -- continues on folic acid.  WBC remains stable, if ever becomes elevated (>20) and continues to elevate then they will do bone marrow biopsy.  Hypoglycemic episodes: none Polydipsia/polyuria: no Visual disturbance: no Chest pain: no Paresthesias: no Glucose Monitoring: yes  Accucheck frequency: BID  Fasting glucose: 135 to 155  Post prandial: 180 to 200  Evening:   Before meals: Taking Insulin?: yes  Long acting insulin: Tresiba 50 units  Short acting insulin: Blood Pressure Monitoring: occasional Retinal Examination: Up to Date -- [facility] Exam: Up to Date Pneumovax: Up to Date Influenza: Up to Date Aspirin: yes   HYPERTENSION / HYPERLIPIDEMIA/CHF Continues on Atorvastatin 20 MG, Metoprolol 12.5 MG daily, Lasix 40 MG daily, Losartan 25 MG daily, + ASA.  Currently on Eliquis. Last saw cardiology on 11/06/21.  Most recent EF 10/21/21 showed EF 40-45% with mildly decreased LV function.  Satisfied with current treatment?  yes Duration of hypertension: chronic BP monitoring frequency: a few times a week BP range:  100-110/60-70 BP medication side effects: no Duration of hyperlipidemia: chronic Cholesterol medication side effects: no Cholesterol supplements: none Medication compliance: good compliance Aspirin: yes Recent stressors: no Recurrent headaches: no Visual changes: no Palpitations: no Dyspnea: no Chest pain: no Lower extremity edema: no Dizzy/lightheaded: no   ATRIAL FIBRILLATION Continues on Eliquis and Metoprolol. Atrial fibrillation status: stable Satisfied with current treatment: yes  Medication side effects:  no Medication compliance: good compliance Etiology of atrial fibrillation: unknown Palpitations:  no Chest pain:  no Dyspnea on exertion:  no Orthopnea:  no Syncope:  no Edema:  no Ventricular rate control: B-blocker Anti-coagulation: long acting   CHRONIC KIDNEY DISEASE Recent labs stable, although K+ was elevated and we increased Lasix a week to help lower levels -- went from 6.5 to 5.3. CKD status: stable Medications renally dose: yes Previous renal evaluation: no Pneumovax:  Up to Date Influenza Vaccine:  Up to Date   COPD Followed by pulmonary for COPD -- last saw 03/10/22.  Currently using Trelegy, Albuterol, and Singulair.  Last pneumonia was 09/11/21.  Has a history of smoking, quit 15 years ago. She does not use CPAP, but continues to use O2 at night 2 L == sleeps in recliner at baseline, has done this since 1998. COPD status: controlled Satisfied with current treatment?: yes  Oxygen use: no Dyspnea frequency: none Cough frequency: none Rescue inhaler frequency: very seldom, hardly ever Limitation of activity: no Productive cough: none Last Spirometry: with pulmonary Pneumovax: Up to Date Influenza: Up to Date  NAUSEA Recently seen for this -- which started after she too Mounjaro, we have now switched back to Trulicity which she is tolerating.  Continues  on Protonix.  She reports feeling better now with no further nausea. Status: improved Satisfied with current treatment? yes Medication side effects: no  Medication compliance: fluctuating Antacid use frequency:  none Dysphagia: no Odynophagia:  no Hematemesis: no Blood in stool: no EGD: yes   CHRONIC PAIN: Followed by [facility] in [place], last seen 02/12/22 -- continues pain pump + Gabapentin.  Followed by clinic nurse for pump check and replacement. Reports pain is well-controlled with current regimen -- at baseline for her.    Vitamin D on low side past labs, taking supplement with recent levels stable.  No recent falls or fractures.  Normal DEXA 2017.  DEPRESSION Continues on Effexor 150 MG daily.  She is currently living with her daughter who owns 66 cats -- patient has one cat of her own. Mood status: stable Satisfied with current treatment?: yes Symptom severity: mild  Duration of current treatment : chronic Side effects: no Medication compliance: good compliance Psychotherapy/counseling: has gone in the past Depressed mood: yes Anxious mood: no Anhedonia: no Significant weight loss or gain: no Insomnia: none Fatigue: no Feelings of worthlessness or guilt: no Impaired concentration/indecisiveness: no Suicidal ideations: no Hopelessness: no Crying spells: no    03/24/2022    9:28 AM 03/14/2022    9:26 AM 01/22/2022   11:00 AM 12/16/2021    9:08 AM 12/12/2021   10:23 AM  Depression screen PHQ 2/9  Decreased Interest 0 0 1 0 0  Down, Depressed, Hopeless _0 0 1  PHQ - 2 Score _1 0 1  Altered sleeping _2 Tired, decreased energy _3 Change in appetite _4 Feeling bad or failure about yourself  0 0 0 0 0  Trouble concentrating 0 0 0 0 0  Moving slowly or fidgety/restless 0 0 0 0 0  Suicidal thoughts 0 0 0 0 0  PHQ-9 Score _5 Difficult doing work/chores Not difficult at all Somewhat difficult Not difficult at all         03/24/2022    9:28 AM 03/14/2022    9:26 AM 01/22/2022   11:00 AM 12/12/2021   10:23 AM  GAD 7 : Generalized Anxiety Score  Nervous, Anxious, on Edge _6 Control/stop worrying 0 _7 Worry too much - different things 0 _8 Trouble relaxing 0 _9 Restless 0 0 1 0  Easily annoyed or irritable 0 0 1 0  Afraid - awful might happen 0 0 0 0  Total GAD 7 Score _10 Anxiety Difficulty Not difficult at all Not difficult at all Somewhat difficult Not difficult at all     Relevant past medical, surgical, family and social history reviewed and updated as indicated. Interim medical history since our last visit reviewed. Allergies and medications reviewed and updated.  Review of Systems  Constitutional:  Negative for activity change, appetite change, diaphoresis, fatigue and fever.  Respiratory:  Negative for cough, chest tightness and shortness of  breath.   Cardiovascular:  Negative for chest pain, palpitations and leg swelling.  Gastrointestinal: Negative.   Endocrine: Negative for heat intolerance, polydipsia, polyphagia and polyuria.  Neurological: Negative.   Psychiatric/Behavioral: Negative.      Per HPI unless specifically indicated above     Objective:    BP (!) 95/59   Pulse 83   Temp 98.3 F (36.8 C) (Oral)   Wt 145 lb (65.8 kg)   LMP  (LMP Unknown)   SpO2 99%   BMI 29.79 kg/m   Wt Readings from Last 3 Encounters:  03/24/22 145 lb (65.8 kg)  03/14/22 145 lb 3.2 oz (65.9 kg)  02/11/22 147 lb 8 oz (66.9 kg)    Physical Exam Vitals and nursing note reviewed.  Constitutional:      General: She is awake. She is not in acute distress.    Appearance: She is well-developed and well-groomed. She is not ill-appearing.  HENT:     Head: Normocephalic.     Right Ear: Hearing normal.     Left Ear: Hearing normal.  Eyes:     General: Lids are normal.        Right eye: No discharge.        Left eye: No discharge.     Conjunctiva/sclera: Conjunctivae  normal.     Pupils: Pupils are equal, round, and reactive to light.  Neck:     Thyroid: No thyromegaly.     Vascular: No carotid bruit.  Cardiovascular:     Rate and Rhythm: Normal rate and regular rhythm.     Heart sounds: Normal heart sounds. No murmur heard.    No gallop.  Pulmonary:     Effort: Pulmonary effort is normal. No accessory muscle usage or respiratory distress.     Breath sounds: Normal breath sounds. No wheezing or rhonchi.  Abdominal:     General: Bowel sounds are normal.     Palpations: Abdomen is soft.  Musculoskeletal:     Cervical back: Normal range of motion and neck supple.     Right lower leg: No edema.     Left lower leg: No edema.  Lymphadenopathy:     Cervical: No cervical adenopathy.  Skin:    General: Skin is warm and dry.  Neurological:     Mental Status: She is alert and oriented to person, place, and time.  Psychiatric:        Attention and Perception: Attention normal.        Mood and Affect: Mood normal.        Speech: Speech normal.        Behavior: Behavior normal. Behavior is cooperative.        Thought Content: Thought content normal.    Results for orders placed or performed in visit on 86/76/72  Basic metabolic panel  Result Value Ref Range   Glucose 104 (H) 70 - 99 mg/dL   BUN 24 8 - 27 mg/dL   Creatinine, Ser 1.45 (H) 0.57 - 1.00 mg/dL   eGFR 39 (L) >59 mL/min/1.73   BUN/Creatinine Ratio 17 12 - 28   Sodium 140 134 - 144 mmol/L   Potassium 5.3 (H) 3.5 - 5.2 mmol/L   Chloride 99 96 - 106 mmol/L   CO2 26 20 - 29 mmol/L   Calcium 9.5 8.7 - 10.3 mg/dL      Assessment & Plan:   Problem List Items Addressed This Visit       Cardiovascular and Mediastinum  Atrial fibrillation with RVR ([place])    Diagnosed on 09/11/21 with underlying PNA.  At this time continue current medication regimen as ordered by cardiology and collaboration with cardiology. Rate control present.      Relevant Orders   Lipid Panel w/o Chol/HDL Ratio    Comprehensive metabolic panel   Chronic HFrEF (heart failure with reduced ejection fraction) (HCC)    Chronic, ongoing with recent EF 40-45% (10/21/21), followed by cardiology.  Euvolemic today.  Continue current medication regimen and collaboration with cardiology.  Recommend: - Reminded to call for an overnight weight gain of >2 pounds or a weekly weight gain of >5 pounds - not adding salt to food and read food labels. Reviewed the importance of keeping daily sodium intake to <2059m daily - Avoid Ibuprofen products      Relevant Orders   Lipid Panel w/o Chol/HDL Ratio   Comprehensive metabolic panel   Hypertension associated with diabetes (HCC)    Chronic, stable with BP well below goal (on lower side, monitor closely).  Recommend she continue to monitor BP at home regularly.  Focus on DASH diet.  Continue current medication regimen and adjust as needed, refills sent as needed.  CMP today.  Return in 3 months.       Relevant Orders   Bayer DCA Hb A1c Waived   Comprehensive metabolic panel     Respiratory   COPD, moderate (HCC)    Chronic, stable with minimal Albuterol use.  Continue Trelegy which offers benefit of medication minimization and has benefited symptoms.  Continue to collaborate with Dr. FRaul Del recent notes reviewed.  No current daytime O2, at night only.        Sleep apnea    Poor tolerance of CPAP mask, uses O2 night 2 L [place].        Digestive   GERD (gastroesophageal reflux disease)    Chronic, ongoing.  Needs repeat colonoscopy in 6 months as was not clear.  Continue Protonix daily and adjust as needed.  Plan on Mag level today.      Relevant Orders   Magnesium     Endocrine   Hyperlipidemia associated with type 2 diabetes mellitus (HCC)    Chronic, stable.  Continue current medication regimen and adjust as needed.  Lipid panel today.      Relevant Orders   Bayer DCA Hb A1c Waived   Lipid Panel w/o Chol/HDL Ratio   Comprehensive metabolic panel    Insulin dependent type 2 diabetes mellitus (HCC) - Primary    Chronic, ongoing, insulin dependent. A1c 7.6% today, similar to previous.  - Will continue Metformin at max dosing, Trulicity 4.5 MG weekly, and Tresiba 50 units.  Will refer to CCM team pharmacist for further recommendations - Recommend she continue to monitor BS consistently at home and document + focus heavily on diet changes.  She is aware to notify provider if fasting BS >130 consistently or <70, as may need to adjust insulin further.   - In past poorly tolerated Jardiance and Mounjaro -- discussed with her that and how SGLT2 would benefit HF -- however concern for side effects.   - Would benefit a [employer] may need to work on coverage for this.  - Return in 3 months.       Relevant Orders   Bayer DCA Hb A1c Waived   Comprehensive metabolic panel   Type 2 diabetes, controlled, with neuropathy (HCC)    Chronic, ongoing, insulin dependent. A1c 7.6% today, similar to previous. -  Continue Gabapentin for neuropathy, refills sent. - Refer to insulin dependent plan of care for further.        Relevant Orders   Bayer DCA Hb A1c Waived   Comprehensive metabolic panel     Genitourinary   Chronic kidney disease, stage III (moderate) (HCC)    Ongoing and stable.  Continue Losartan at low dose for heart and kidney protection.  Check CMP today.  Will refer to nephrology if any worsening in future.      Relevant Orders   Comprehensive metabolic panel     Other   Presence of intrathecal pump (Chronic)    Continue to collaborate with pain clinic in [facility] who monitors this.      B12 deficiency    Chronic, stable.  B12 level obtained today, continue injections monthly.      Relevant Orders   Vitamin B12   Chronic pain syndrome    Continue to collaborate with chronic pain clinic in [facility].  Has morphine pump.        Depression, major, single episode, in partial remission (HCC)    Chronic, stable.   Denies SI/HI.  Mood well-controlled.  Continue current medication regimen and adjust as needed.  Refills as needed.  Consider trial of Duloxetine and transition off Effexor in future for dual pain and mood benefit.        Nausea    Improved at this time, suspect related to [facility] which she poorly tolerated.      Obesity    BMI 29.79.  Recommended eating smaller high protein, low fat meals more frequently and exercising 30 mins a day 5 times a week with a goal of 10-15lb weight loss in the next 3 months. Patient voiced their understanding and motivation to adhere to these recommendations.       Vitamin D deficiency    Chronic, stable.  Recheck Vitamin D today and adjust supplement as needed.      Relevant Orders   VITAMIN D 25 Hydroxy (Vit-D Deficiency, Fractures)     Follow up plan: Return in about 3 months (around 06/24/2022) for T2DM, HTN/HLD, CKD, MOOD, HF.

## 2022-03-24 NOTE — Assessment & Plan Note (Addendum)
Poor tolerance of CPAP mask, uses O2 night 2 L [place].

## 2022-03-24 NOTE — Assessment & Plan Note (Signed)
Chronic, stable with minimal Albuterol use.  Continue Trelegy which offers benefit of medication minimization and has benefited symptoms.  Continue to collaborate with [name], recent notes reviewed.  No current daytime O2, at night only.

## 2022-03-24 NOTE — Assessment & Plan Note (Signed)
Chronic, ongoing.  Needs repeat colonoscopy in 6 months as was not clear.  Continue Protonix daily and adjust as needed.  Plan on Mag level today.

## 2022-03-24 NOTE — Assessment & Plan Note (Signed)
Chronic, stable.  Continue current medication regimen and adjust as needed.  Lipid panel today. 

## 2022-03-24 NOTE — Assessment & Plan Note (Signed)
Chronic, stable.  Denies SI/HI.  Mood well-controlled.  Continue current medication regimen and adjust as needed.  Refills as needed.  Consider trial of Duloxetine and transition off Effexor in future for dual pain and mood benefit.

## 2022-03-24 NOTE — Assessment & Plan Note (Signed)
Continue to collaborate with chronic pain clinic in [facility].  Has morphine pump.

## 2022-03-24 NOTE — Assessment & Plan Note (Signed)
Chronic, ongoing with recent EF 40-45% (10/21/21), followed by cardiology.  Euvolemic today.  Continue current medication regimen and collaboration with cardiology.  Recommend: - Reminded to call for an overnight weight gain of >2 pounds or a weekly weight gain of >5 pounds - not adding salt to food and read food labels. Reviewed the importance of keeping daily sodium intake to '2000mg'$  daily - Avoid Ibuprofen products

## 2022-03-24 NOTE — Assessment & Plan Note (Signed)
Chronic, ongoing, insulin dependent. A1c 7.6% today, similar to previous. - Continue Gabapentin for neuropathy, refills sent. - Refer to insulin dependent plan of care for further.   

## 2022-03-24 NOTE — Assessment & Plan Note (Addendum)
Diagnosed on 09/11/21 with underlying PNA.  At this time continue current medication regimen as ordered by cardiology and collaboration with cardiology. Rate control present.

## 2022-03-24 NOTE — Assessment & Plan Note (Signed)
Continue to collaborate with pain clinic in [place] who monitors this. 

## 2022-03-24 NOTE — Assessment & Plan Note (Signed)
Chronic, ongoing, insulin dependent. A1c 7.6% today, similar to previous.  - Will continue Metformin at max dosing, Trulicity 4.5 MG weekly, and Tresiba 50 units.  Will refer to CCM team pharmacist for further recommendations - Recommend she continue to monitor BS consistently at home and document + focus heavily on diet changes.  She is aware to notify provider if fasting BS >130 consistently or <70, as may need to adjust insulin further.   - In past poorly tolerated Jardiance and Mounjaro -- discussed with her that and how SGLT2 would benefit HF -- however concern for side effects.   - Would benefit a [employer], may need to work on coverage for this.  - Return in 3 months.

## 2022-03-24 NOTE — Addendum Note (Signed)
Addended by: Irena Reichmann on: 03/24/2022 10:06 AM   Modules accepted: Orders

## 2022-03-24 NOTE — Assessment & Plan Note (Signed)
BMI 29.79.  Recommended eating smaller high protein, low fat meals more frequently and exercising 30 mins a day 5 times a week with a goal of 10-15lb weight loss in the next 3 months. Patient voiced their understanding and motivation to adhere to these recommendations.

## 2022-03-24 NOTE — Assessment & Plan Note (Signed)
Improved at this time, suspect related to [facility] which she poorly tolerated.

## 2022-03-24 NOTE — Assessment & Plan Note (Signed)
Chronic, stable.  B12 level obtained today, continue injections monthly. 

## 2022-03-25 LAB — LIPID PANEL W/O CHOL/HDL RATIO
Cholesterol, Total: 160 mg/dL (ref 100–199)
HDL: 53 mg/dL (ref >39)
LDL Chol Calc (NIH): 85 mg/dL (ref 0–99)
Triglycerides: 127 mg/dL (ref 0–149)
VLDL Cholesterol Cal: 22 mg/dL (ref 5–40)

## 2022-03-25 LAB — MAGNESIUM: Magnesium: 1.5 mg/dL — ABNORMAL LOW (ref 1.6–2.3)

## 2022-03-25 LAB — COMPREHENSIVE METABOLIC PANEL
ALT: 10 IU/L (ref 0–32)
AST: 15 IU/L (ref 0–40)
Albumin/Globulin Ratio: 1.5 (ref 1.2–2.2)
Albumin: 3.8 g/dL (ref 3.8–4.8)
Alkaline Phosphatase: 61 IU/L (ref 44–121)
BUN/Creatinine Ratio: 14 (ref 12–28)
BUN: 19 mg/dL (ref 8–27)
Bilirubin Total: 0.2 mg/dL (ref 0.0–1.2)
CO2: 28 mmol/L (ref 20–29)
Calcium: 9.2 mg/dL (ref 8.7–10.3)
Chloride: 99 mmol/L (ref 96–106)
Creatinine, Ser: 1.32 mg/dL — ABNORMAL HIGH (ref 0.57–1.00)
Globulin, Total: 2.5 g/dL (ref 1.5–4.5)
Glucose: 131 mg/dL — ABNORMAL HIGH (ref 70–99)
Potassium: 4.7 mmol/L (ref 3.5–5.2)
Sodium: 142 mmol/L (ref 134–144)
Total Protein: 6.3 g/dL (ref 6.0–8.5)
eGFR: 43 mL/min/{1.73_m2} — ABNORMAL LOW (ref >59)

## 2022-03-25 LAB — VITAMIN B12: Vitamin B-12: 394 pg/mL (ref 232–1245)

## 2022-03-25 LAB — VITAMIN D 25 HYDROXY (VIT D DEFICIENCY, FRACTURES): Vit D, 25-Hydroxy: 48.2 ng/mL (ref 30.0–100.0)

## 2022-03-25 NOTE — Progress Notes (Signed)
Good afternoon, please let Jordan Chan know her labs have returned: - Kidney function continues to show baseline kidney disease, a little better this check, we will continue to monitor closely.  Liver function is normal.  Potassium has now returned to normal, return to previous dose of Lasix, no extra doses.   - Magnesium is a little low, this can be related to daily Protonix use.  I recommend starting magnesium supplement -- 400 MG every night to help with this and if possible try to reduce Protonix to every other day dosing. - Remainder of labs are stable.  Any questions? Keep being awesome!!  Thank you for allowing me to participate in your care.  I appreciate you. Kindest regards, Jacquese Hackman

## 2022-03-28 ENCOUNTER — Telehealth: Payer: Self-pay

## 2022-03-28 NOTE — Chronic Care Management (AMB) (Signed)
  Chronic Care Management   Note  03/28/2022 Name: Jordan Chan MRN: [id number] DOB: Jul 19, 1951  Jordan Chan is a 71 y.o. year old female who is a primary care patient of [name], [name], NP. I reached out to Jordan Chan by phone today in response to a referral sent by [name]'s PCP.  [name] was given information about Chronic Care Management services today including:  CCM service includes personalized support from designated clinical staff supervised by her physician, including individualized plan of care and coordination with other care providers 24/7 contact phone numbers for assistance for urgent and routine care needs. Service will only be billed when office clinical staff spend 20 minutes or more in a month to coordinate care. Only one practitioner may furnish and bill the service in a calendar month. The patient may stop CCM services at any time (effective at the end of the month) by phone call to the office staff. The patient is responsible for co-pay (up to 20% after annual deductible is met) if co-pay is required by the individual health plan.   Patient agreed to services and verbal consent obtained.   Follow up plan: Telephone appointment with care management team member scheduled for:06/16/2022  Noreene Larsson, [place], [place] [address] Direct Dial: [phone] [email or web address]

## 2022-04-04 ENCOUNTER — Ambulatory Visit
Admission: RE | Admit: 2022-04-04 | Discharge: 2022-04-04 | Disposition: A | Discharge status: Home / Self Care | Payer: Medicare HMO | Source: Ambulatory Visit | Attending: Nurse Practitioner | Admitting: Nurse Practitioner

## 2022-04-04 ENCOUNTER — Other Ambulatory Visit: Payer: Self-pay | Admitting: Nurse Practitioner

## 2022-04-04 DIAGNOSIS — Z1231 Encounter for screening mammogram for malignant neoplasm of breast: Secondary | ICD-10-CM

## 2022-04-23 DIAGNOSIS — M47816 Spondylosis without myelopathy or radiculopathy, lumbar region: Secondary | ICD-10-CM | POA: Diagnosis not present

## 2022-04-23 DIAGNOSIS — M5442 Lumbago with sciatica, left side: Secondary | ICD-10-CM | POA: Diagnosis not present

## 2022-04-23 DIAGNOSIS — M542 Cervicalgia: Secondary | ICD-10-CM | POA: Diagnosis not present

## 2022-05-01 ENCOUNTER — Ambulatory Visit: Payer: Medicare HMO | Attending: Internal Medicine | Admitting: Internal Medicine

## 2022-05-01 ENCOUNTER — Encounter: Payer: Self-pay | Admitting: Internal Medicine

## 2022-05-01 VITALS — BP 114/84 | HR 92 | Ht 58.5 in | Wt 146.0 lb

## 2022-05-01 DIAGNOSIS — I4819 Other persistent atrial fibrillation: Secondary | ICD-10-CM | POA: Diagnosis not present

## 2022-05-01 DIAGNOSIS — E1169 Type 2 diabetes mellitus with other specified complication: Secondary | ICD-10-CM

## 2022-05-01 DIAGNOSIS — I5181 Takotsubo syndrome: Secondary | ICD-10-CM

## 2022-05-01 DIAGNOSIS — I4891 Unspecified atrial fibrillation: Secondary | ICD-10-CM

## 2022-05-01 DIAGNOSIS — I5020 Unspecified systolic (congestive) heart failure: Secondary | ICD-10-CM | POA: Diagnosis not present

## 2022-05-01 DIAGNOSIS — I10 Essential (primary) hypertension: Secondary | ICD-10-CM

## 2022-05-01 DIAGNOSIS — E785 Hyperlipidemia, unspecified: Secondary | ICD-10-CM | POA: Diagnosis not present

## 2022-05-01 NOTE — Progress Notes (Signed)
Follow-up Outpatient Visit Date: 05/01/2022  Primary Care Provider: [name], NP [place] [address]  Chief Complaint: Follow-up atrial fibrillation and cardiomyopathy  HPI:  Jordan Chan is a 71 y.o. female with history of paroxysmal atrial fibrillation, stress-induced cardiomyopathy in 04/2017 in the setting of pain pump malfunction and opioid withdrawal, PE following hospitalization for pneumonia (summer 2021), chronic low back pain, diabetes mellitus, COPD, GERD, sleep apnea, and IBS, who presents for follow-up of cardiomyopathy and paroxysmal atrial fibrillation.  She was hospitalized in 09/2021 with acute on chronic hypoxic respiratory failure and septic shock.  She was also noted to have atrial fibrillation with rapid ventricular response and severely reduced LVEF.  She converted to sinus rhythm with IV amiodarone and was discharged on apixaban.  She saw Dr. Quentin Ore in follow-up in April.  He recommended anticoagulation with outstanding antiarrhythmic therapy.  Jordan Chan was planning to monitor her heart rate with an Apple Watch, though she never proceeded with this.  Today, Jordan Chan reports that she has been feeling well from a heart standpoint without chest pain, shortness of breath, palpitations, lightheadedness, and edema.  Home HR's have been normal for her with her BP cuff.  She has been under quite a bit of stress, as her 70 year-old grandson suffered multiple strokes due to a carotid artery dissection that occurred while playing football.  --------------------------------------------------------------------------------------------------  Past Medical History:  Diagnosis Date   Arthritis    Asthma    Atrial fibrillation ([place])    Breast cancer ([place]) 1998   right breast ca with mastectomy and chemotherapy and radiation   Bronchitis    CHF (congestive heart failure) ([place])    "with Morphine withdrawal"   COPD (chronic obstructive pulmonary disease)  ([place])    Diabetes mellitus without complication ([place])    Diverticulitis    diverticulosis also   Dyspnea    Endometriosis    GERD (gastroesophageal reflux disease)    History of shingles 2000-2005   Hypercholesteremia    Hypertension    IBS (irritable bowel syndrome)    Low back pain    a. Implanted morphine/bupivicaine/clonidine pump.   Neuropathy    Orthopnea    Oxygen dependent    uses at night   Personal history of chemotherapy    Personal history of radiation therapy    Pneumonia    pneumonia 5-6 times, history of bronchitis also   Scoliosis    Sleep apnea    does not use cpap   Stroke ([place]) 2010   TIA, 10 years ago   Withdrawal from sedative drug ([place])    withdrawal from morphine when pump batteries died   Past Surgical History:  Procedure Laterality Date   ABDOMINAL HYSTERECTOMY  1987   BACK SURGERY     Tailbone removed following fracture   BREAST SURGERY Right    mastectomy   CARDIAC CATHETERIZATION     CATARACT EXTRACTION W/PHACO Right 05/19/2019   Procedure: CATARACT EXTRACTION PHACO AND INTRAOCULAR LENS PLACEMENT ([place]), RIGHT, DIABETIC;  Surgeon: Marchia Meiers, MD;  Location: [facility] ORS;  Service: Ophthalmology;  Laterality: Right;  Lot # [id number] H [place]: 00:43.8 CDE: 4.59   CATARACT EXTRACTION W/PHACO Left 06/16/2019   Procedure: CATARACT EXTRACTION PHACO AND INTRAOCULAR LENS PLACEMENT ([place]) LEFT VISION BLUE DIABETIC;  Surgeon: Marchia Meiers, MD;  Location: [facility] ORS;  Service: Ophthalmology;  Laterality: Left;  Lot #[id number] H [place]: 00:46.9 CDE: 6.53   COCCYX REMOVAL     COLONOSCOPY WITH PROPOFOL N/A 01/01/2017  Procedure: COLONOSCOPY WITH PROPOFOL;  Surgeon: Jonathon Bellows, MD;  Location: [facility] ENDOSCOPY;  Service: Endoscopy;  Laterality: N/A;   COLONOSCOPY WITH PROPOFOL N/A 12/11/2021   Procedure: COLONOSCOPY WITH PROPOFOL;  Surgeon: Lin Landsman, MD;  Location: [facility] ENDOSCOPY;  Service: Gastroenterology;  Laterality: N/A;   ELBOW ARTHROSCOPY WITH TENDON  RECONSTRUCTION     ESOPHAGOGASTRODUODENOSCOPY (EGD) WITH PROPOFOL N/A 01/01/2017   Procedure: ESOPHAGOGASTRODUODENOSCOPY (EGD) WITH PROPOFOL;  Surgeon: Jonathon Bellows, MD;  Location: [facility] ENDOSCOPY;  Service: Endoscopy;  Laterality: N/A;   ESOPHAGOGASTRODUODENOSCOPY (EGD) WITH PROPOFOL N/A 12/11/2021   Procedure: ESOPHAGOGASTRODUODENOSCOPY (EGD) WITH PROPOFOL;  Surgeon: Lin Landsman, MD;  Location: [facility] ENDOSCOPY;  Service: Gastroenterology;  Laterality: N/A;   INTRATHECAL PUMP IMPLANT     LEFT HEART CATH AND CORONARY ANGIOGRAPHY N/A 04/28/2017   Procedure: LEFT HEART CATH AND CORONARY ANGIOGRAPHY;  Surgeon: [name], MD;  Location: [place] CV LAB;  Service: Cardiovascular;  Laterality: N/A;   MASTECTOMY Right 06/1997   morphine pump  2011   PLANTAR FASCIA RELEASE     TRIGGER FINGER RELEASE      Current Meds  Medication Sig   ACCU-CHEK AVIVA PLUS test strip TEST THREE TIMES DAILY   Accu-Chek Softclix Lancets lancets TEST BLOOD SUGAR THREE TIMES DAILY   albuterol (PROVENTIL HFA;VENTOLIN HFA) 108 (90 BASE) MCG/ACT inhaler Inhale 2 puffs into the lungs every 6 (six) hours as needed for wheezing or shortness of breath.    Alcohol Swabs (DROPSAFE ALCOHOL PREP) 70 % PADS USE TWICE DAILY  WITH  SUGAR  CHECKS   apixaban (ELIQUIS) 5 MG TABS tablet Take 1 tablet (5 mg total) by mouth 2 (two) times daily.   atorvastatin (LIPITOR) 20 MG tablet Take 1 tablet (20 mg total) by mouth daily.   Blood Glucose Monitoring Suppl (TRUE METRIX METER) w/Device KIT Use to check blood sugar 4 times a day   cholecalciferol (VITAMIN D3) 25 MCG (1000 UNIT) tablet Take 1,000 Units by mouth daily.   Cyanocobalamin 1000 MCG/ML KIT Inject 1,000 mcg as directed every 30 (thirty) days.   cyclobenzaprine (FLEXERIL) 5 MG tablet Take 5 mg by mouth 2 (two) times daily as needed for muscle spasms. Takes very rarely   diclofenac (VOLTAREN) 50 MG EC tablet TAKE 1 TABLET THREE TIMES DAILY   Dulaglutide (TRULICITY) 4.5  GN/5.6OZ SOPN Inject 4.5 mg as directed once a week.   folic acid (FOLVITE) 1 MG tablet TAKE 1 TABLET EVERY DAY   furosemide (LASIX) 40 MG tablet TAKE 1 TABLET EVERY DAY   gabapentin (NEURONTIN) 800 MG tablet Take 1 tablet (800 mg total) by mouth 3 (three) times daily.   insulin degludec (TRESIBA FLEXTOUCH) 100 UNIT/ML FlexTouch Pen Inject 50 Units into the skin at bedtime.   metFORMIN (GLUCOPHAGE) 1000 MG tablet TAKE 1 TABLET TWICE DAILY WITH MEALS   metoprolol succinate (TOPROL-XL) 25 MG 24 hr tablet TAKE 1/2 TABLET EVERY DAY   montelukast (SINGULAIR) 10 MG tablet Take 10 mg by mouth at bedtime.   ondansetron (ZOFRAN) 4 MG tablet Take 1 tablet (4 mg total) by mouth every 8 (eight) hours as needed for nausea or vomiting.   PAIN MANAGEMENT INTRATHECAL, IT, PUMP 1 each by Intrathecal route. Intrathecal (IT) medication:  Morphine Patient does not remember current. Adjusted every 2.5 months at [facility] in [place].   pantoprazole (PROTONIX) 40 MG tablet Take 1 tablet (40 mg total) by mouth daily.   TRELEGY ELLIPTA 100-62.5-25 MCG/ACT AEPB INHALE 1 PUFF INTO THE LUNGS  DAILY   venlafaxine XR (EFFEXOR-XR) 150 MG 24 hr capsule TAKE 1 CAPSULE EVERY DAY    Allergies: Other, Pain patch [menthol], Avelox [moxifloxacin hcl in nacl], Doxycycline, Erythromycin, Fentanyl, Moxifloxacin hcl, Oxycontin [oxycodone], and Ozempic [semaglutide]  Social History   Tobacco Use   Smoking status: Former    Packs/day: 1.00    Years: 30.00    Total pack years: 30.00    Types: Cigarettes    Quit date: 04/30/2004    Years since quitting: 18.0   Smokeless tobacco: Never  Vaping Use   Vaping Use: Never used  Substance Use Topics   Alcohol use: No   Drug use: No    Family History  Problem Relation Age of Onset   Cancer Mother 29       lung   Thyroid disease Mother    Cancer Father 88       Pancreatic   Hypertension Sister    Cancer Sister        "cancer on face"   Hyperlipidemia  Sister    Osteoporosis Maternal Grandmother    Cancer Paternal Grandmother        colon   Arthritis Paternal Grandfather    Hyperlipidemia Son    Seizures Son    Breast cancer Neg Hx     Review of Systems: A 12-system review of systems was performed and was negative except as noted in the HPI.  --------------------------------------------------------------------------------------------------  Physical Exam: BP 114/84 (BP Location: Left Arm, Patient Position: Sitting, Cuff Size: Normal)   Pulse 92   Ht 4' 10.5" (1.486 m)   Wt 146 lb (66.2 kg)   LMP  (LMP Unknown)   SpO2 96%   BMI 29.99 kg/m   General:  NAD. Neck: No JVD or HJR. Lungs: Clear to auscultation bilaterally without wheezes or crackles. Heart: Regular rate and rhythm without murmurs, rubs, or gallops. Abdomen: Soft, nontender, nondistended. Extremities: No lower extremity edema.  EKG:  Normal sinus rhythm with PAC's and nonspecific T wave changes.  Compared with prior tracing from 11/06/2021, PAC's and nonspecific T wave changes are now present.  Criteria for anterior infarct are no longer present.  Lab Results  Component Value Date   WBC 10.4 03/14/2022   HGB 11.7 03/14/2022   HCT 36.0 03/14/2022   MCV 96 03/14/2022   PLT 418 03/14/2022    Lab Results  Component Value Date   NA 142 03/24/2022   K 4.7 03/24/2022   CL 99 03/24/2022   CO2 28 03/24/2022   BUN 19 03/24/2022   CREATININE 1.32 (H) 03/24/2022   GLUCOSE 131 (H) 03/24/2022   ALT 10 03/24/2022    Lab Results  Component Value Date   CHOL 160 03/24/2022   HDL 53 03/24/2022   LDLCALC 85 03/24/2022   TRIG 127 03/24/2022    --------------------------------------------------------------------------------------------------  ASSESSMENT AND PLAN: HFrEF with recovered ejection fraction and stress-induced cardiomyopathy: No signs or symptoms of recurrent heart failure.  LVEF 40-45% by echo in 10/2021 but improved to 67% by MPI in 11/2021.  We will  continue current doses of metoprolol succinate and losartan.  Persistent atrial fibrillation: EKG today shows sinus rhythm with PAC's.  Ms. Ohanian was not able to procure an Apple Watch, as she had discussed with Dr. Quentin Ore at their last visit.  However, given normal HR readings by BP cuff at home, we will defer additional monitoring today.  We will continue current dose of metoprolol and long-term anticoagulation with apixaban.  Hypertension: BP normal  today.  No medication changes at this time.  Hyperlipidemia associated with type 2 diabetes mellitus: Lipids reasonable on last check in 03/2022.  Continue atorvastatin 20 mg daily.  Ongoing management of DM per [name].  Follow-up: Return to clinic in 6 months.  [name], MD 05/01/2022 9:47 AM

## 2022-05-01 NOTE — Patient Instructions (Signed)
Medication Instructions:   Your physician recommends that you continue on your current medications as directed. Please refer to the Current Medication list given to you today.  *If you need a refill on your cardiac medications before your next appointment, please call your pharmacy*   Lab Work:  None Ordered  If you have labs (blood work) drawn today and your tests are completely normal, you will receive your results only by: [place] (if you have MyChart) OR A paper copy in the mail If you have any lab test that is abnormal or we need to change your treatment, we will call you to review the results.   Testing/Procedures:  None Ordered   Follow-Up: At [facility], you and your health needs are our priority.  As part of our continuing mission to provide you with exceptional heart care, we have created designated Provider Care Teams.  These Care Teams include your primary Cardiologist (physician) and Advanced Practice Providers (APPs -  Physician Assistants and Nurse Practitioners) who all work together to provide you with the care you need, when you need it.  We recommend signing up for the patient portal called "MyChart".  Sign up information is provided on this After Visit Summary.  MyChart is used to connect with patients for Virtual Visits (Telemedicine).  Patients are able to view lab/test results, encounter notes, upcoming appointments, etc.  Non-urgent messages can be sent to your provider as well.   To learn more about what you can do with MyChart, go to [email or web address].    Your next appointment:   6 month(s)  The format for your next appointment:   In Person  Provider:   You may see [name], MD or one of the following Advanced Practice Providers on your designated Care Team:   Murray Hodgkins, NP [name], PA-C Cadence Kathlen Mody, PA-C Gerrie Nordmann, NP      Important Information About Sugar

## 2022-05-02 ENCOUNTER — Encounter: Payer: Self-pay | Admitting: Internal Medicine

## 2022-05-02 DIAGNOSIS — I5020 Unspecified systolic (congestive) heart failure: Secondary | ICD-10-CM | POA: Insufficient documentation

## 2022-05-07 ENCOUNTER — Emergency Department: Payer: Medicare HMO

## 2022-05-07 ENCOUNTER — Emergency Department
Admission: EM | Admit: 2022-05-07 | Discharge: 2022-05-07 | Disposition: A | Discharge status: Home / Self Care | Payer: Medicare HMO | Attending: Emergency Medicine | Admitting: Emergency Medicine

## 2022-05-07 ENCOUNTER — Other Ambulatory Visit: Payer: Self-pay

## 2022-05-07 DIAGNOSIS — T671XXA Heat syncope, initial encounter: Secondary | ICD-10-CM | POA: Diagnosis not present

## 2022-05-07 DIAGNOSIS — R55 Syncope and collapse: Secondary | ICD-10-CM | POA: Insufficient documentation

## 2022-05-07 DIAGNOSIS — Y92410 Unspecified street and highway as the place of occurrence of the external cause: Secondary | ICD-10-CM | POA: Insufficient documentation

## 2022-05-07 DIAGNOSIS — R000 Tachycardia, unspecified: Secondary | ICD-10-CM | POA: Diagnosis not present

## 2022-05-07 DIAGNOSIS — R739 Hyperglycemia, unspecified: Secondary | ICD-10-CM | POA: Diagnosis not present

## 2022-05-07 LAB — CBC
HCT: 36 % (ref 36.0–46.0)
Hemoglobin: 11.3 g/dL — ABNORMAL LOW (ref 12.0–15.0)
MCH: 30.3 pg (ref 26.0–34.0)
MCHC: 31.4 g/dL (ref 30.0–36.0)
MCV: 96.5 fL (ref 80.0–100.0)
Platelets: 349 10*3/uL (ref 150–400)
RBC: 3.73 MIL/uL — ABNORMAL LOW (ref 3.87–5.11)
RDW: 12.9 % (ref 11.5–15.5)
WBC: 9.1 10*3/uL (ref 4.0–10.5)
nRBC: 0 % (ref 0.0–0.2)

## 2022-05-07 LAB — BASIC METABOLIC PANEL
Anion gap: 6 (ref 5–15)
BUN: 22 mg/dL (ref 8–23)
CO2: 29 mmol/L (ref 22–32)
Calcium: 8.7 mg/dL — ABNORMAL LOW (ref 8.9–10.3)
Chloride: 100 mmol/L (ref 98–111)
Creatinine, Ser: 1.35 mg/dL — ABNORMAL HIGH (ref 0.44–1.00)
GFR, Estimated: 42 mL/min — ABNORMAL LOW (ref >60)
Glucose, Bld: 237 mg/dL — ABNORMAL HIGH (ref 70–99)
Potassium: 3.7 mmol/L (ref 3.5–5.1)
Sodium: 135 mmol/L (ref 135–145)

## 2022-05-07 LAB — TROPONIN I (HIGH SENSITIVITY): Troponin I (High Sensitivity): 5 ng/L (ref <18)
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
## 2022-05-08 ENCOUNTER — Telehealth: Payer: Self-pay | Admitting: *Deleted

## 2022-05-08 ENCOUNTER — Other Ambulatory Visit: Payer: Self-pay | Admitting: Nurse Practitioner

## 2022-05-08 NOTE — Telephone Encounter (Signed)
Requested Prescriptions  Pending Prescriptions Disp Refills  . Accu-Chek Softclix Lancets lancets [Pharmacy Med Name: ACCU-CHEK SOFTCLIX LANCETS] 300 each 0    Sig: TEST BLOOD SUGAR THREE TIMES DAILY     Endocrinology: Diabetes - Testing Supplies Passed - 05/08/2022  2:56 AM      Passed - Valid encounter within last 12 months    Recent Outpatient Visits          1 month ago Insulin dependent type 2 diabetes mellitus ([place])   [place] [name], Henrine Screws T, NP   1 month ago Nausea   [place], Chamberino T, NP   3 months ago Insulin dependent type 2 diabetes mellitus ([place])   [place] [name], Jolene T, NP   4 months ago Insulin dependent type 2 diabetes mellitus ([place])   [place] [name], Jolene T, NP   6 months ago Atrial fibrillation with RVR ([place])   [place], [name], NP      Future Appointments            In 1 month [name], [name], NP [employer]   In 5 months [place], Harrell Gave, MD [place]. [place]

## 2022-05-08 NOTE — Telephone Encounter (Signed)
Transition Care Management Follow-up Telephone Call Date of discharge and from where: [facility] 05-07-2022   How have you been since you were released from the hospital?   Feeling good sore Any questions or concerns? No  Items Reviewed: Did the pt receive and understand the discharge instructions provided? Yes  Medications obtained and verified? No  Other? No  Any new allergies since your discharge? No  Dietary orders reviewed? No Do you have support at home? Yes   Home Care and Equipment/Supplies: Were home health services ordered?  If so, what is the name of the agency?   Has the agency set up a time to come to the patient's home?  Were any new equipment or medical supplies ordered?   What is the name of the medical supply agency?  Were you able to get the supplies/equipment?  Do you have any questions related to the use of the equipment or supplies?   Functional Questionnaire: (I = Independent and D = Dependent) ADLs: I  Bathing/Dressing- I  Meal Prep- I  Eating- I  Maintaining continence- I  Transferring/Ambulation- I  Managing Meds- I  Follow up appointments reviewed:  PCP Hospital f/u appt confirmed? Yes  Scheduled to see [facility] 05-20-2022 [facility] f/u appt confirmed? No  S. Are transportation arrangements needed? No  If their condition worsens, is the pt aware to call PCP or go to the Emergency Dept.? Yes Was the patient provided with contact information for the PCP's office or ED? Yes Was to pt encouraged to call back with questions or concerns? Yes
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
## 2022-05-20 ENCOUNTER — Telehealth: Payer: Self-pay

## 2022-05-20 ENCOUNTER — Encounter: Payer: Self-pay | Admitting: Nurse Practitioner

## 2022-05-20 ENCOUNTER — Ambulatory Visit (INDEPENDENT_AMBULATORY_CARE_PROVIDER_SITE_OTHER): Payer: Medicare HMO | Admitting: Nurse Practitioner

## 2022-05-20 VITALS — BP 131/69 | HR 82 | Temp 97.5°F | Ht 58.5 in | Wt 145.2 lb

## 2022-05-20 DIAGNOSIS — Z23 Encounter for immunization: Secondary | ICD-10-CM | POA: Diagnosis not present

## 2022-05-20 DIAGNOSIS — Z59819 Housing instability, housed unspecified: Secondary | ICD-10-CM | POA: Diagnosis not present

## 2022-05-20 DIAGNOSIS — F324 Major depressive disorder, single episode, in partial remission: Secondary | ICD-10-CM

## 2022-05-20 DIAGNOSIS — D649 Anemia, unspecified: Secondary | ICD-10-CM | POA: Diagnosis not present

## 2022-05-20 DIAGNOSIS — D508 Other iron deficiency anemias: Secondary | ICD-10-CM

## 2022-05-20 DIAGNOSIS — H00015 Hordeolum externum left lower eyelid: Secondary | ICD-10-CM | POA: Diagnosis not present

## 2022-05-20 MED ORDER — ERYTHROMYCIN 5 MG/GM OP OINT: 1.0000 | TOPICAL_OINTMENT | Freq: Every day | OPHTHALMIC | 0 refills | Status: AC

## 2022-05-20 MED ORDER — BUSPIRONE HCL 5 MG PO TABS: 5.0000 mg | ORAL_TABLET | Freq: Two times a day (BID) | ORAL | 12 refills | Status: DC
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
## 2022-05-20 NOTE — Assessment & Plan Note (Signed)
Chronic, exacerbated.  Denies SI/HI.  Mood well-controlled.  Continue current medication regimen and adjust as needed -- will add on Buspar 5 MG BID, may increase to 10 MG BID -- educated patient on this.  Referral to SDOH to assist with housing needs.  Refills as needed.

## 2022-05-20 NOTE — Telephone Encounter (Signed)
   Telephone encounter was:  Unsuccessful.  05/20/2022 Name: Jordan Chan MRN: [id number] DOB: 1951-07-14  Unsuccessful outbound call made today to assist with:   housing  Outreach Attempt:  1st Attempt  A HIPAA compliant voice message was left requesting a return call.  Instructed patient to call back    [place], [facility]  [phone] [address], [place], [place] [address] Phone: [phone] Email: [email or web address]

## 2022-05-20 NOTE — Assessment & Plan Note (Signed)
Acute for 3 days.  Sent in Erythromycin and recommend warm compresses TID.  Overall vision intact.  Return as scheduled in November.
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
## 2022-05-21 ENCOUNTER — Telehealth: Payer: Self-pay

## 2022-05-21 LAB — CBC WITH DIFFERENTIAL/PLATELET
Basophils Absolute: 0.1 10*3/uL (ref 0.0–0.2)
Basos: 1 %
EOS (ABSOLUTE): 0.4 10*3/uL (ref 0.0–0.4)
Eos: 4 %
Hematocrit: 37.4 % (ref 34.0–46.6)
Hemoglobin: 12.2 g/dL (ref 11.1–15.9)
Immature Grans (Abs): 0 10*3/uL (ref 0.0–0.1)
Immature Granulocytes: 0 %
Lymphocytes Absolute: 2.7 10*3/uL (ref 0.7–3.1)
Lymphs: 29 %
MCH: 31.4 pg (ref 26.6–33.0)
MCHC: 32.6 g/dL (ref 31.5–35.7)
MCV: 96 fL (ref 79–97)
Monocytes Absolute: 0.7 10*3/uL (ref 0.1–0.9)
Monocytes: 7 %
Neutrophils Absolute: 5.6 10*3/uL (ref 1.4–7.0)
Neutrophils: 59 %
Platelets: 424 10*3/uL (ref 150–450)
RBC: 3.88 x10E6/uL (ref 3.77–5.28)
RDW: 12.3 % (ref 11.7–15.4)
WBC: 9.5 10*3/uL (ref 3.4–10.8)

## 2022-05-21 LAB — BASIC METABOLIC PANEL
BUN/Creatinine Ratio: 13 (ref 12–28)
BUN: 13 mg/dL (ref 8–27)
CO2: 26 mmol/L (ref 20–29)
Calcium: 10.1 mg/dL (ref 8.7–10.3)
Chloride: 94 mmol/L — ABNORMAL LOW (ref 96–106)
Creatinine, Ser: 1.01 mg/dL — ABNORMAL HIGH (ref 0.57–1.00)
Glucose: 251 mg/dL — ABNORMAL HIGH (ref 70–99)
Potassium: 4.8 mmol/L (ref 3.5–5.2)
Sodium: 135 mmol/L (ref 134–144)
eGFR: 59 mL/min/{1.73_m2} — ABNORMAL LOW (ref >59)

## 2022-05-21 LAB — FERRITIN: Ferritin: 47 ng/mL (ref 15–150)

## 2022-05-21 LAB — IRON AND TIBC
Iron Saturation: 17 % (ref 15–55)
Iron: 56 ug/dL (ref 27–139)
Total Iron Binding Capacity: 339 ug/dL (ref 250–450)
UIBC: 283 ug/dL (ref 118–369)

## 2022-05-21 NOTE — Progress Notes (Signed)
Good morning, please let Monik know labs have returned.  Anemia seen on ER labs has improved and iron level normal.  Kidney function shows some mild improvement this check, although ongoing chronic kidney disease present.  Ensure plenty of water intake at home.  Glucose was elevated at 251, continue to monitor sugars closely and we may adjust regimen in November.  Any questions?  Also the population health people have been trying to call her about housing concerns -- please call Levada Dy at [phone]. Keep being awesome!!  Thank you for allowing me to participate in your care.  I appreciate you. Kindest regards, Jennifermarie Franzen

## 2022-05-21 NOTE — Telephone Encounter (Signed)
   Telephone encounter was:  Unsuccessful.  05/21/2022 Name: Jordan Chan MRN: [id number] DOB: 16-Dec-1950  Unsuccessful outbound call made today to assist with:  Food Insecurity  Outreach Attempt:  2nd Attempt  A HIPAA compliant voice message was left requesting a return call.  Instructed patient to call back    [place], [place] [facility]  [phone] [address], [place], [place] [address] Phone: [phone] Email: [email or web address]

## 2022-05-23 ENCOUNTER — Telehealth: Payer: Self-pay

## 2022-05-23 NOTE — Telephone Encounter (Signed)
   Telephone encounter was:  Successful.  05/23/2022 Name: Jordan Chan MRN: [id number] DOB: 04-14-1951  Jordan Chan is a 71 y.o. year old female who is a primary care patient of [name], [name], NP . The community resource team was consulted for assistance with  housing  Care guide performed the following interventions: Patient provided with information about care guide support team and interviewed to confirm resource needs.Patient stated she was in need of income based housing to be mailed to her  Follow Up Plan:  No further follow up planned at this time. The patient has been provided with needed resources.   [place], [facility]  [phone] [address], [place], [place] [address] Phone: [phone] Email: [email or web address]

## 2022-06-16 ENCOUNTER — Ambulatory Visit (INDEPENDENT_AMBULATORY_CARE_PROVIDER_SITE_OTHER): Payer: Medicare HMO

## 2022-06-16 DIAGNOSIS — E119 Type 2 diabetes mellitus without complications: Secondary | ICD-10-CM

## 2022-06-16 DIAGNOSIS — I152 Hypertension secondary to endocrine disorders: Secondary | ICD-10-CM

## 2022-06-16 DIAGNOSIS — E1169 Type 2 diabetes mellitus with other specified complication: Secondary | ICD-10-CM
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
## 2022-06-22 NOTE — Patient Instructions (Signed)
Diabetes Mellitus Basics  Diabetes mellitus, or diabetes, is a long-term (chronic) disease. It occurs when the body does not properly use sugar (glucose) that is released from food after you eat. Diabetes mellitus may be caused by one or both of these problems: Your pancreas does not make enough of a hormone called insulin. Your body does not react in a normal way to the insulin that it makes. Insulin lets glucose enter cells in your body. This gives you energy. If you have diabetes, glucose cannot get into cells. This causes high blood glucose (hyperglycemia). How to treat and manage diabetes You may need to take insulin or other diabetes medicines daily to keep your glucose in balance. If you are prescribed insulin, you will learn how to give yourself insulin by injection. You may need to adjust the amount of insulin you take based on the foods that you eat. You will need to check your blood glucose levels using a glucose monitor as told by your health care provider. The readings can help determine if you have low or high blood glucose. Generally, you should have these blood glucose levels: Before meals (preprandial): 80-130 mg/dL (4.4-7.2 mmol/L). After meals (postprandial): below 180 mg/dL (10 mmol/L). Hemoglobin A1c (HbA1c) level: less than 7%. Your health care provider will set treatment goals for you. Keep all follow-up visits. This is important. Follow these instructions at home: Diabetes medicines Take your diabetes medicines every day as told by your health care provider. List your diabetes medicines here: Name of medicine: ______________________________ Amount (dose): _______________ Time (a.m./p.m.): _______________ Notes: ___________________________________ Name of medicine: ______________________________ Amount (dose): _______________ Time (a.m./p.m.): _______________ Notes: ___________________________________ Name of medicine: ______________________________ Amount (dose):  _______________ Time (a.m./p.m.): _______________ Notes: ___________________________________ Insulin If you use insulin, list the types of insulin you use here: Insulin type: ______________________________ Amount (dose): _______________ Time (a.m./p.m.): _______________Notes: ___________________________________ Insulin type: ______________________________ Amount (dose): _______________ Time (a.m./p.m.): _______________ Notes: ___________________________________ Insulin type: ______________________________ Amount (dose): _______________ Time (a.m./p.m.): _______________ Notes: ___________________________________ Insulin type: ______________________________ Amount (dose): _______________ Time (a.m./p.m.): _______________ Notes: ___________________________________ Insulin type: ______________________________ Amount (dose): _______________ Time (a.m./p.m.): _______________ Notes: ___________________________________ Managing blood glucose  Check your blood glucose levels using a glucose monitor as told by your health care provider. Write down the times that you check your glucose levels here: Time: _______________ Notes: ___________________________________ Time: _______________ Notes: ___________________________________ Time: _______________ Notes: ___________________________________ Time: _______________ Notes: ___________________________________ Time: _______________ Notes: ___________________________________ Time: _______________ Notes: ___________________________________  Low blood glucose Low blood glucose (hypoglycemia) is when glucose is at or below 70 mg/dL (3.9 mmol/L). Symptoms may include: Feeling: Hungry. Sweaty and clammy. Irritable or easily upset. Dizzy. Sleepy. Having: A fast heartbeat. A headache. A change in your vision. Numbness around the mouth, lips, or tongue. Having trouble with: Moving (coordination). Sleeping. Treating low blood glucose To treat low blood  glucose, eat or drink something containing sugar right away. If you can think clearly and swallow safely, follow the 15:15 rule: Take 15 grams of a fast-acting carb (carbohydrate), as told by your health care provider. Some fast-acting carbs are: Glucose tablets: take 3-4 tablets. Hard candy: eat 3-5 pieces. Fruit juice: drink 4 oz (120 mL). Regular (not diet) soda: drink 4-6 oz (120-180 mL). Honey or sugar: eat 1 Tbsp (15 mL). Check your blood glucose levels 15 minutes after you take the carb. If your glucose is still at or below 70 mg/dL (3.9 mmol/L), take 15 grams of a carb again. If your glucose does not go above 70 mg/dL (3.9 mmol/L) after   3 tries, get help right away. After your glucose goes back to normal, eat a meal or a snack within 1 hour. Treating very low blood glucose If your glucose is at or below 54 mg/dL (3 mmol/L), you have very low blood glucose (severe hypoglycemia). This is an emergency. Do not wait to see if the symptoms will go away. Get medical help right away. Call your local emergency services (911 in the [place] Do not drive yourself to the hospital. Questions to ask your health care provider Should I talk with a diabetes educator? What equipment will I need to care for myself at home? What diabetes medicines do I need? When should I take them? How often do I need to check my blood glucose levels? What number can I call if I have questions? When is my follow-up visit? Where can I find a support group for people with diabetes? Where to find more information American Diabetes Association: www.diabetes.org Association of Diabetes Care and Education Specialists: www.diabeteseducator.org Contact a health care provider if: Your blood glucose is at or above 240 mg/dL (13.3 mmol/L) for 2 days in a row. You have been sick or have had a fever for 2 days or more, and you are not getting better. You have any of these problems for more than 6 hours: You cannot eat or  drink. You feel nauseous. You vomit. You have diarrhea. Get help right away if: Your blood glucose is lower than 54 mg/dL (3 mmol/L). You get confused. You have trouble thinking clearly. You have trouble breathing. These symptoms may represent a serious problem that is an emergency. Do not wait to see if the symptoms will go away. Get medical help right away. Call your local emergency services (911 in the [place] Do not drive yourself to the hospital. Summary Diabetes mellitus is a chronic disease that occurs when the body does not properly use sugar (glucose) that is released from food after you eat. Take insulin and diabetes medicines as told. Check your blood glucose every day, as often as told. Keep all follow-up visits. This is important. This information is not intended to replace advice given to you by your health care provider. Make sure you discuss any questions you have with your health care provider. Document Revised: 11/22/2019 Document Reviewed: 11/22/2019 Elsevier Patient Education  2023 Elsevier Inc.  

## 2022-06-23 ENCOUNTER — Telehealth: Payer: Self-pay

## 2022-06-23 NOTE — Progress Notes (Signed)
Chronic Care Management Pharmacy Assistant   Name: Jordan Chan  MRN: [id number] DOB: 08/09/1950  Jordan Chan is an 71 y.o. year old female who presents for his initial CCM visit with the clinical pharmacist.  Reason for Encounter: Initial Visit with CPP    Recent office visits:  05/20/22 Jordan Guarneri T, NP (Depression ) Orders placed: Labs; Medication changes: Buspirone 5 mg, erythromycin 5 mg/gm  03/24/22 Jordan Guarneri T, NP (Diabetes) Orders placed: Labs; Medication changes: none  03/14/22 Jordan Guarneri T, NP (Nausea) Orders placed: Labs; Medication changes: none  01/22/22 Jordan Guarneri T, NP (Nausea) Orders placed: none; Medication changes: Tirzepatide 7.5 mg  Recent consult visits:  05/01/22 End, Harrell Gave, MD-Cardiology (Follow up) Orders placed: EKG; Medication changes: none  03/10/22 Jordan Pian, MD-Pulmonary (COPD) [facility]  02/11/22 Jordan Gu[place], MD-Oncology (Thrombocytosis) No orders places or medication changes  Hospital visits:  Medication Reconciliation was completed by comparing discharge summary, patient's EMR and Pharmacy list, and upon discussion with patient.  Admitted to the hospital on 05/07/22 due to loss of consciousness. Discharge date was 05/07/22. Discharged from [facility] ED.   Medications that remain the same after Hospital Discharge:??  -All other medications will remain the same.    Medications: Outpatient Encounter Medications as of 06/23/2022  Medication Sig   ACCU-CHEK AVIVA PLUS test strip TEST THREE TIMES DAILY   Accu-Chek Softclix Lancets lancets TEST BLOOD SUGAR THREE TIMES DAILY   albuterol (PROVENTIL HFA;VENTOLIN HFA) 108 (90 BASE) MCG/ACT inhaler Inhale 2 puffs into the lungs every 6 (six) hours as needed for wheezing or shortness of breath.    Alcohol Swabs (DROPSAFE ALCOHOL PREP) 70 % PADS USE TWICE DAILY  WITH  SUGAR  CHECKS   apixaban (ELIQUIS) 5 MG TABS  tablet Take 1 tablet (5 mg total) by mouth 2 (two) times daily.   atorvastatin (LIPITOR) 20 MG tablet Take 1 tablet (20 mg total) by mouth daily.   Blood Glucose Monitoring Suppl (TRUE METRIX METER) w/Device KIT Use to check blood sugar 4 times a day   busPIRone (BUSPAR) 5 MG tablet Take 1 tablet (5 mg total) by mouth 2 (two) times daily.   cholecalciferol (VITAMIN D3) 25 MCG (1000 UNIT) tablet Take 1,000 Units by mouth daily.   Cyanocobalamin 1000 MCG/ML KIT Inject 1,000 mcg as directed every 30 (thirty) days.   cyclobenzaprine (FLEXERIL) 5 MG tablet Take 5 mg by mouth 2 (two) times daily as needed for muscle spasms. Takes very rarely   diclofenac (VOLTAREN) 50 MG EC tablet TAKE 1 TABLET THREE TIMES DAILY   Dulaglutide (TRULICITY) 4.5 PP/2.9JJ SOPN Inject 4.5 mg as directed once a week.   folic acid (FOLVITE) 1 MG tablet TAKE 1 TABLET EVERY DAY   furosemide (LASIX) 40 MG tablet TAKE 1 TABLET EVERY DAY   gabapentin (NEURONTIN) 800 MG tablet Take 1 tablet (800 mg total) by mouth 3 (three) times daily.   insulin degludec (TRESIBA FLEXTOUCH) 100 UNIT/ML FlexTouch Pen Inject 50 Units into the skin at bedtime.   losartan (COZAAR) 25 MG tablet Take 0.5 tablets (12.5 mg total) by mouth daily.   metFORMIN (GLUCOPHAGE) 1000 MG tablet TAKE 1 TABLET TWICE DAILY WITH MEALS   metoprolol succinate (TOPROL-XL) 25 MG 24 hr tablet TAKE 1/2 TABLET EVERY DAY   montelukast (SINGULAIR) 10 MG tablet Take 10 mg by mouth at bedtime.   ondansetron (ZOFRAN) 4 MG tablet Take 1 tablet (4 mg total) by mouth every  8 (eight) hours as needed for nausea or vomiting.   PAIN MANAGEMENT INTRATHECAL, IT, PUMP 1 each by Intrathecal route. Intrathecal (IT) medication:  Morphine Patient does not remember current. Adjusted every 2.5 months at [facility] in [place].   pantoprazole (PROTONIX) 40 MG tablet Take 1 tablet (40 mg total) by mouth daily.   TRELEGY ELLIPTA 100-62.5-25 MCG/ACT AEPB INHALE 1 PUFF INTO THE  LUNGS DAILY   venlafaxine XR (EFFEXOR-XR) 150 MG 24 hr capsule TAKE 1 CAPSULE EVERY DAY   No facility-administered encounter medications on file as of 06/23/2022.   Medication List ACCU-CHEK AVIVA PLUS test strip-last fill 05/11/21 Accu-Chek Softclix Lancets lancets-last fill 05/08/22 albuterol (PROVENTIL HFA;VENTOLIN HFA) 108 (90 BASE) MCG/ACT inhaler-last fill 05/22/22 75 ds  Alcohol Swabs (DROPSAFE ALCOHOL PREP) 70 % PADS-last fill 06/23/22 apixaban (ELIQUIS) 5 MG TABS tablet-last fill 05/27/22 30 ds atorvastatin (LIPITOR) 20 MG tablet-last fill 09/02/21 90 ds Blood Glucose Monitoring Suppl (TRUE METRIX METER) w/Device KIT-last fill 05/15/21 busPIRone (BUSPAR) 5 MG tablet-last fill 06/15/22 30 ds cholecalciferol (VITAMIN D3) 25 MCG (1000 UNIT) tablet-NA Cyanocobalamin 1000 MCG/ML KIT-NA cyclobenzaprine (FLEXERIL) 5 MG tablet-last fill 03/18/21 30 ds diclofenac (VOLTAREN) 50 MG EC tablet-last fill 02/03/22 90 ds Dulaglutide (TRULICITY) 4.5 IL/5.7VJ SOPN-last fill 28/20/60 84 ds folic acid (FOLVITE) 1 MG tablet-last fill 10/14/21  furosemide (LASIX) 40 MG tablet-last fill 02/27/22 90 ds gabapentin (NEURONTIN) 800 MG tablet-last fill 06/08/22 90 ds insulin degludec (TRESIBA FLEXTOUCH) 100 UNIT/ML FlexTouch Pen-last fill 07/30/21 losartan (COZAAR) 25 MG tablet (Expired) metFORMIN (GLUCOPHAGE) 1000 MG tablet-last fill 04/30/22 90 ds metoprolol succinate (TOPROL-XL) 25 MG 24 hr tablet-last fill 02/27/22 90 ds montelukast (SINGULAIR) 10 MG tablet-last fill 02/06/21 ondansetron (ZOFRAN) 4 MG tablet-last fill 03/14/22 15 ds PAIN MANAGEMENT INTRATHECAL, IT, PUMP-NA pantoprazole (PROTONIX) 40 MG tablet-last fill 06/08/22 90 ds TRELEGY ELLIPTA 100-62.5-25 MCG/ACT AEPB-last fill 12/04/21 venlafaxine XR (EFFEXOR-XR) 150 MG 24 hr capsule-last fill 05/13/22 90 ds  Care Gaps: Colonoscopy-12/11/21 Diabetic Foot Exam-12/12/21 Mammogram-04/04/22 Ophthalmology-03/21/22 Dexa Scan - 02/25/16 Annual Well Visit -  12/16/21 Micro albumin-12/12/21 Hemoglobin A1c- 03/24/22 (7.6), 12/12/21 (7.6)  Star Rating Drugs: Atorvastatin 20 mg-last fill 09/02/21 90 ds, 06/21/22 90 ds Losartan 25 mg-last fill 11/23/21 90 ds, 09/16/21 90 ds  [place] [phone]

## 2022-06-24 ENCOUNTER — Encounter: Payer: Self-pay | Admitting: Nurse Practitioner

## 2022-06-24 ENCOUNTER — Ambulatory Visit (INDEPENDENT_AMBULATORY_CARE_PROVIDER_SITE_OTHER): Payer: Medicare HMO | Admitting: Nurse Practitioner

## 2022-06-24 VITALS — BP 126/75 | HR 82 | Temp 98.2°F | Ht 58.5 in | Wt 149.5 lb

## 2022-06-24 DIAGNOSIS — E66811 Obesity, class 1: Secondary | ICD-10-CM

## 2022-06-24 DIAGNOSIS — K219 Gastro-esophageal reflux disease without esophagitis: Secondary | ICD-10-CM

## 2022-06-24 DIAGNOSIS — Z794 Long term (current) use of insulin: Secondary | ICD-10-CM | POA: Diagnosis not present

## 2022-06-24 DIAGNOSIS — Z978 Presence of other specified devices: Secondary | ICD-10-CM

## 2022-06-24 DIAGNOSIS — N1831 Chronic kidney disease, stage 3a: Secondary | ICD-10-CM | POA: Diagnosis not present

## 2022-06-24 DIAGNOSIS — E538 Deficiency of other specified B group vitamins: Secondary | ICD-10-CM

## 2022-06-24 DIAGNOSIS — E1140 Type 2 diabetes mellitus with diabetic neuropathy, unspecified: Secondary | ICD-10-CM

## 2022-06-24 DIAGNOSIS — G894 Chronic pain syndrome: Secondary | ICD-10-CM

## 2022-06-24 DIAGNOSIS — E119 Type 2 diabetes mellitus without complications: Secondary | ICD-10-CM

## 2022-06-24 DIAGNOSIS — Z0289 Encounter for other administrative examinations: Secondary | ICD-10-CM

## 2022-06-24 DIAGNOSIS — J449 Chronic obstructive pulmonary disease, unspecified: Secondary | ICD-10-CM

## 2022-06-24 DIAGNOSIS — I4891 Unspecified atrial fibrillation: Secondary | ICD-10-CM | POA: Diagnosis not present

## 2022-06-24 DIAGNOSIS — E1169 Type 2 diabetes mellitus with other specified complication: Secondary | ICD-10-CM

## 2022-06-24 DIAGNOSIS — I700 Atherosclerosis of aorta: Secondary | ICD-10-CM

## 2022-06-24 DIAGNOSIS — F324 Major depressive disorder, single episode, in partial remission: Secondary | ICD-10-CM | POA: Diagnosis not present

## 2022-06-24 DIAGNOSIS — I152 Hypertension secondary to endocrine disorders: Secondary | ICD-10-CM | POA: Diagnosis not present

## 2022-06-24 DIAGNOSIS — E785 Hyperlipidemia, unspecified: Secondary | ICD-10-CM | POA: Diagnosis not present

## 2022-06-24 DIAGNOSIS — G4733 Obstructive sleep apnea (adult) (pediatric): Secondary | ICD-10-CM

## 2022-06-24 DIAGNOSIS — E1159 Type 2 diabetes mellitus with other circulatory complications: Secondary | ICD-10-CM | POA: Diagnosis not present

## 2022-06-24 DIAGNOSIS — I5022 Chronic systolic (congestive) heart failure: Secondary | ICD-10-CM

## 2022-06-24 DIAGNOSIS — E6609 Other obesity due to excess calories: Secondary | ICD-10-CM

## 2022-06-24 DIAGNOSIS — Z86711 Personal history of pulmonary embolism: Secondary | ICD-10-CM

## 2022-06-24 DIAGNOSIS — Z6830 Body mass index (BMI) 30.0-30.9, adult: Secondary | ICD-10-CM

## 2022-06-24 LAB — BAYER DCA HB A1C WAIVED: HB A1C (BAYER DCA - WAIVED): 7.6 % — ABNORMAL HIGH (ref 4.8–5.6)

## 2022-06-24 MED ORDER — VENLAFAXINE HCL ER 150 MG PO CP24: 150.0000 mg | ORAL_CAPSULE | Freq: Every day | ORAL | 4 refills | Status: DC

## 2022-06-24 MED ORDER — CYANOCOBALAMIN 1000 MCG/ML IJ SOLN
1000.0000 ug | Freq: Once | INTRAMUSCULAR | Status: AC
  Administered 2022-06-24: 1000 ug via INTRAMUSCULAR

## 2022-06-24 MED ORDER — METOPROLOL SUCCINATE ER 25 MG PO TB24: 12.5000 mg | ORAL_TABLET | Freq: Every day | ORAL | 4 refills | Status: DC

## 2022-06-24 MED ORDER — LOSARTAN POTASSIUM 25 MG PO TABS: 12.5000 mg | ORAL_TABLET | Freq: Every day | ORAL | 4 refills | Status: DC

## 2022-06-24 MED ORDER — MONTELUKAST SODIUM 10 MG PO TABS: 10.0000 mg | ORAL_TABLET | Freq: Every day | ORAL | 4 refills | Status: DC

## 2022-06-24 MED ORDER — FUROSEMIDE 40 MG PO TABS: 40.0000 mg | ORAL_TABLET | Freq: Every day | ORAL | 4 refills | Status: DC

## 2022-06-24 MED ORDER — APIXABAN 5 MG PO TABS: 5.0000 mg | ORAL_TABLET | Freq: Two times a day (BID) | ORAL | 4 refills | Status: DC

## 2022-06-24 NOTE — Assessment & Plan Note (Signed)
BMI 30.71.  Recommended eating smaller high protein, low fat meals more frequently and exercising 30 mins a day 5 times a week with a goal of 10-15lb weight loss in the next 3 months. Patient voiced their understanding and motivation to adhere to these recommendations.

## 2022-06-24 NOTE — Assessment & Plan Note (Signed)
Chronic, stable.  B12 level obtained today, continue injections monthly. 
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
## 2022-06-24 NOTE — Assessment & Plan Note (Signed)
Chronic.  Continue to collaborate with chronic pain clinic in [place].  Has morphine pump.   

## 2022-06-24 NOTE — Assessment & Plan Note (Signed)
Chronic, ongoing, insulin dependent. A1c remains 7.6% today, similar to previous.  - Will continue Metformin at max dosing, Trulicity 4.5 MG weekly, and recommend increase Tresiba to 53 units, increase a further 3 units in 3 days if fasting sugar not less then 130 consistently.  Reviewed CCM team pharmacist note for further recommendations - Recommend she continue to monitor BS consistently at home and document + focus heavily on diet changes.  She is aware to notify provider if fasting BS >130 consistently or <70, as may need to adjust insulin further.   - In past poorly tolerated Jardiance and Mounjaro -- discussed with her that and how SGLT2 would benefit HF -- however concern for side effects.   - Would benefit a [employer], may need to work on coverage for this.  - Return in 3 months.

## 2022-06-24 NOTE — Assessment & Plan Note (Signed)
Chronic, ongoing.  Denies SI/HI.  Mood well-controlled.  Continue current medication regimen and adjust as needed.  Referral to SDOH placed last visit to assist with housing needs.  Refills as needed.

## 2022-06-24 NOTE — Assessment & Plan Note (Signed)
Chronic, ongoing. Continue Protonix daily and adjust as needed.  Plan on Mag level today.  Risks of PPI use were discussed with patient including bone loss, C. Diff diarrhea, pneumonia, infections, CKD, electrolyte abnormalities.  Verbalizes understanding and chooses to continue the medication.  

## 2022-06-24 NOTE — Assessment & Plan Note (Signed)
Chronic, stable.  Diagnosed on 09/11/21 with underlying PNA.  At this time continue current medication regimen as ordered by cardiology and collaboration with cardiology. Rate control present. 

## 2022-06-24 NOTE — Assessment & Plan Note (Signed)
Chronic, stable.  BP well below goal for her age.  Recommend she continue to monitor BP at home regularly.  Focus on DASH diet.  Continue current medication regimen and adjust as needed, refills sent as needed.  CMP today.  Return in 3 months.

## 2022-06-24 NOTE — Assessment & Plan Note (Signed)
Stable.  Currently being followed by pulmonary, continue this collaboration.

## 2022-06-24 NOTE — Assessment & Plan Note (Signed)
Ongoing.  Noted on CT 12/11/19.  Continue daily statin.  Monitor closely for symptoms. 

## 2022-06-24 NOTE — Assessment & Plan Note (Signed)
Chronic, stable.  Continue current medication regimen and adjust as needed.  Lipid panel today. 

## 2022-06-24 NOTE — Assessment & Plan Note (Signed)
Chronic, stable. Minimal use of Albuterol. Continue Trelegy which offers benefit of medication minimization and has benefited symptoms.  Continue to collaborate with Dr. Fleming, recent notes reviewed.  No current daytime O2, at night only.   

## 2022-06-24 NOTE — Assessment & Plan Note (Signed)
Continue to collaborate with pain clinic in [place] who monitors this. 

## 2022-06-24 NOTE — Assessment & Plan Note (Signed)
Chronic, ongoing, insulin dependent. A1c 7.6% today, similar to previous. - Continue Gabapentin for neuropathy, refills sent. - Refer to insulin dependent plan of care for further.

## 2022-06-24 NOTE — Assessment & Plan Note (Signed)
Chronic.  Poor tolerance of CPAP mask, uses O2 night 2 L [place]. 

## 2022-06-24 NOTE — Assessment & Plan Note (Signed)
Ongoing and stable.  Continue Losartan at low dose for heart and kidney protection.  Check CMP today.  Will refer to nephrology if any worsening in future.

## 2022-06-24 NOTE — Assessment & Plan Note (Signed)
Chronic, ongoing with most recent EF 40-45% (10/21/21), followed by cardiology.  Euvolemic today.  Continue current medication regimen and collaboration with cardiology.  Recommend: - Reminded to call for an overnight weight gain of >2 pounds or a weekly weight gain of >5 pounds - not adding salt to food and read food labels. Reviewed the importance of keeping daily sodium intake to <2000mg daily - Avoid Ibuprofen products 

## 2022-06-25 ENCOUNTER — Other Ambulatory Visit: Payer: Self-pay | Admitting: Nurse Practitioner

## 2022-06-25 LAB — COMPREHENSIVE METABOLIC PANEL
ALT: 7 IU/L (ref 0–32)
AST: 10 IU/L (ref 0–40)
Albumin/Globulin Ratio: 1.4 (ref 1.2–2.2)
Albumin: 4.1 g/dL (ref 3.8–4.8)
Alkaline Phosphatase: 73 IU/L (ref 44–121)
BUN/Creatinine Ratio: 15 (ref 12–28)
BUN: 15 mg/dL (ref 8–27)
Bilirubin Total: <0.2 mg/dL (ref 0.0–1.2)
CO2: 28 mmol/L (ref 20–29)
Calcium: 9.9 mg/dL (ref 8.7–10.3)
Chloride: 99 mmol/L (ref 96–106)
Creatinine, Ser: 0.99 mg/dL (ref 0.57–1.00)
Globulin, Total: 3 g/dL (ref 1.5–4.5)
Glucose: 153 mg/dL — ABNORMAL HIGH (ref 70–99)
Potassium: 5.2 mmol/L (ref 3.5–5.2)
Sodium: 138 mmol/L (ref 134–144)
Total Protein: 7.1 g/dL (ref 6.0–8.5)
eGFR: 61 mL/min/{1.73_m2} (ref >59)

## 2022-06-25 LAB — LIPID PANEL W/O CHOL/HDL RATIO
Cholesterol, Total: 167 mg/dL (ref 100–199)
HDL: 59 mg/dL (ref >39)
LDL Chol Calc (NIH): 85 mg/dL (ref 0–99)
Triglycerides: 129 mg/dL (ref 0–149)
VLDL Cholesterol Cal: 23 mg/dL (ref 5–40)

## 2022-06-25 LAB — MAGNESIUM: Magnesium: 1.7 mg/dL (ref 1.6–2.3)

## 2022-06-25 LAB — VITAMIN B12: Vitamin B-12: 327 pg/mL (ref 232–1245)

## 2022-06-25 MED ORDER — ATORVASTATIN CALCIUM 40 MG PO TABS: 40.0000 mg | ORAL_TABLET | Freq: Every day | ORAL | 3 refills | Status: DC

## 2022-06-25 NOTE — Progress Notes (Signed)
Good afternoon, please let Chlora know her labs have returned and overall they are stable. Kidney function normal this check and liver function.  Cholesterol levels still a little above goal for stroke and heart attack prevention -- I have sent in increase in Atorvastatin to 40 MG, stop the 20 MG pills (or complete them out by taking 2 a day and then start 40 MG pills). Any questions? Keep being amazing!!  Thank you for allowing me to participate in your care.  I appreciate you. Kindest regards, Lyrick Worland

## 2022-07-02 DIAGNOSIS — Z79899 Other long term (current) drug therapy: Secondary | ICD-10-CM | POA: Diagnosis not present

## 2022-07-02 DIAGNOSIS — M5442 Lumbago with sciatica, left side: Secondary | ICD-10-CM | POA: Diagnosis not present

## 2022-07-02 DIAGNOSIS — G893 Neoplasm related pain (acute) (chronic): Secondary | ICD-10-CM | POA: Diagnosis not present

## 2022-07-02 DIAGNOSIS — M542 Cervicalgia: Secondary | ICD-10-CM | POA: Diagnosis not present

## 2022-07-02 DIAGNOSIS — Z5181 Encounter for therapeutic drug level monitoring: Secondary | ICD-10-CM | POA: Diagnosis not present

## 2022-07-02 DIAGNOSIS — Z978 Presence of other specified devices: Secondary | ICD-10-CM | POA: Diagnosis not present

## 2022-07-03 DIAGNOSIS — E1159 Type 2 diabetes mellitus with other circulatory complications: Secondary | ICD-10-CM

## 2022-07-03 DIAGNOSIS — I152 Hypertension secondary to endocrine disorders: Secondary | ICD-10-CM

## 2022-07-03 DIAGNOSIS — Z794 Long term (current) use of insulin: Secondary | ICD-10-CM

## 2022-07-03 DIAGNOSIS — E1169 Type 2 diabetes mellitus with other specified complication: Secondary | ICD-10-CM

## 2022-07-03 DIAGNOSIS — E119 Type 2 diabetes mellitus without complications: Secondary | ICD-10-CM

## 2022-07-03 DIAGNOSIS — E785 Hyperlipidemia, unspecified: Secondary | ICD-10-CM

## 2022-07-13 ENCOUNTER — Inpatient Hospital Stay
Admission: EM | Admit: 2022-07-13 | Discharge: 2022-07-18 | DRG: 190 | Disposition: A | Discharge status: Home / Self Care | Payer: Medicare HMO | Attending: Internal Medicine | Admitting: Internal Medicine

## 2022-07-13 ENCOUNTER — Emergency Department: Payer: Medicare HMO

## 2022-07-13 ENCOUNTER — Other Ambulatory Visit: Payer: Self-pay

## 2022-07-13 DIAGNOSIS — Z7985 Long-term (current) use of injectable non-insulin antidiabetic drugs: Secondary | ICD-10-CM

## 2022-07-13 DIAGNOSIS — Z881 Allergy status to other antibiotic agents status: Secondary | ICD-10-CM | POA: Diagnosis not present

## 2022-07-13 DIAGNOSIS — J121 Respiratory syncytial virus pneumonia: Secondary | ICD-10-CM | POA: Diagnosis not present

## 2022-07-13 DIAGNOSIS — Z794 Long term (current) use of insulin: Secondary | ICD-10-CM

## 2022-07-13 DIAGNOSIS — I5022 Chronic systolic (congestive) heart failure: Secondary | ICD-10-CM | POA: Diagnosis present

## 2022-07-13 DIAGNOSIS — J9601 Acute respiratory failure with hypoxia: Secondary | ICD-10-CM | POA: Diagnosis not present

## 2022-07-13 DIAGNOSIS — R0902 Hypoxemia: Secondary | ICD-10-CM | POA: Diagnosis not present

## 2022-07-13 DIAGNOSIS — J441 Chronic obstructive pulmonary disease with (acute) exacerbation: Secondary | ICD-10-CM | POA: Diagnosis present

## 2022-07-13 DIAGNOSIS — E7800 Pure hypercholesterolemia, unspecified: Secondary | ICD-10-CM | POA: Diagnosis present

## 2022-07-13 DIAGNOSIS — F419 Anxiety disorder, unspecified: Secondary | ICD-10-CM | POA: Diagnosis present

## 2022-07-13 DIAGNOSIS — Z853 Personal history of malignant neoplasm of breast: Secondary | ICD-10-CM

## 2022-07-13 DIAGNOSIS — Z8673 Personal history of transient ischemic attack (TIA), and cerebral infarction without residual deficits: Secondary | ICD-10-CM

## 2022-07-13 DIAGNOSIS — I5042 Chronic combined systolic (congestive) and diastolic (congestive) heart failure: Secondary | ICD-10-CM | POA: Diagnosis present

## 2022-07-13 DIAGNOSIS — I130 Hypertensive heart and chronic kidney disease with heart failure and stage 1 through stage 4 chronic kidney disease, or unspecified chronic kidney disease: Secondary | ICD-10-CM | POA: Diagnosis present

## 2022-07-13 DIAGNOSIS — Z9011 Acquired absence of right breast and nipple: Secondary | ICD-10-CM

## 2022-07-13 DIAGNOSIS — I10 Essential (primary) hypertension: Secondary | ICD-10-CM | POA: Diagnosis present

## 2022-07-13 DIAGNOSIS — Z8262 Family history of osteoporosis: Secondary | ICD-10-CM

## 2022-07-13 DIAGNOSIS — Z79899 Other long term (current) drug therapy: Secondary | ICD-10-CM | POA: Diagnosis not present

## 2022-07-13 DIAGNOSIS — E1122 Type 2 diabetes mellitus with diabetic chronic kidney disease: Secondary | ICD-10-CM | POA: Diagnosis present

## 2022-07-13 DIAGNOSIS — Z8249 Family history of ischemic heart disease and other diseases of the circulatory system: Secondary | ICD-10-CM

## 2022-07-13 DIAGNOSIS — E1159 Type 2 diabetes mellitus with other circulatory complications: Secondary | ICD-10-CM | POA: Diagnosis present

## 2022-07-13 DIAGNOSIS — I482 Chronic atrial fibrillation, unspecified: Secondary | ICD-10-CM | POA: Diagnosis present

## 2022-07-13 DIAGNOSIS — E1165 Type 2 diabetes mellitus with hyperglycemia: Secondary | ICD-10-CM | POA: Diagnosis present

## 2022-07-13 DIAGNOSIS — B974 Respiratory syncytial virus as the cause of diseases classified elsewhere: Secondary | ICD-10-CM | POA: Diagnosis present

## 2022-07-13 DIAGNOSIS — Z923 Personal history of irradiation: Secondary | ICD-10-CM

## 2022-07-13 DIAGNOSIS — N183 Chronic kidney disease, stage 3 unspecified: Secondary | ICD-10-CM | POA: Diagnosis present

## 2022-07-13 DIAGNOSIS — Z7951 Long term (current) use of inhaled steroids: Secondary | ICD-10-CM

## 2022-07-13 DIAGNOSIS — Z1152 Encounter for screening for COVID-19: Secondary | ICD-10-CM | POA: Diagnosis not present

## 2022-07-13 DIAGNOSIS — Z885 Allergy status to narcotic agent status: Secondary | ICD-10-CM

## 2022-07-13 DIAGNOSIS — Z8349 Family history of other endocrine, nutritional and metabolic diseases: Secondary | ICD-10-CM

## 2022-07-13 DIAGNOSIS — J189 Pneumonia, unspecified organism: Secondary | ICD-10-CM | POA: Diagnosis not present

## 2022-07-13 DIAGNOSIS — I152 Hypertension secondary to endocrine disorders: Secondary | ICD-10-CM | POA: Diagnosis present

## 2022-07-13 DIAGNOSIS — Z9981 Dependence on supplemental oxygen: Secondary | ICD-10-CM

## 2022-07-13 DIAGNOSIS — R0602 Shortness of breath: Secondary | ICD-10-CM | POA: Diagnosis not present

## 2022-07-13 DIAGNOSIS — Z7901 Long term (current) use of anticoagulants: Secondary | ICD-10-CM

## 2022-07-13 DIAGNOSIS — Z888 Allergy status to other drugs, medicaments and biological substances status: Secondary | ICD-10-CM

## 2022-07-13 DIAGNOSIS — B338 Other specified viral diseases: Principal | ICD-10-CM

## 2022-07-13 DIAGNOSIS — J9621 Acute and chronic respiratory failure with hypoxia: Secondary | ICD-10-CM | POA: Diagnosis present

## 2022-07-13 DIAGNOSIS — F32A Depression, unspecified: Secondary | ICD-10-CM | POA: Diagnosis present

## 2022-07-13 DIAGNOSIS — K219 Gastro-esophageal reflux disease without esophagitis: Secondary | ICD-10-CM | POA: Diagnosis present

## 2022-07-13 DIAGNOSIS — G894 Chronic pain syndrome: Secondary | ICD-10-CM | POA: Diagnosis present

## 2022-07-13 DIAGNOSIS — Z9221 Personal history of antineoplastic chemotherapy: Secondary | ICD-10-CM

## 2022-07-13 DIAGNOSIS — Z83438 Family history of other disorder of lipoprotein metabolism and other lipidemia: Secondary | ICD-10-CM

## 2022-07-13 DIAGNOSIS — E119 Type 2 diabetes mellitus without complications: Secondary | ICD-10-CM

## 2022-07-13 DIAGNOSIS — I480 Paroxysmal atrial fibrillation: Secondary | ICD-10-CM

## 2022-07-13 DIAGNOSIS — E860 Dehydration: Secondary | ICD-10-CM | POA: Diagnosis present

## 2022-07-13 DIAGNOSIS — Z87891 Personal history of nicotine dependence: Secondary | ICD-10-CM | POA: Diagnosis not present

## 2022-07-13 DIAGNOSIS — Z7984 Long term (current) use of oral hypoglycemic drugs: Secondary | ICD-10-CM

## 2022-07-13 DIAGNOSIS — N1831 Chronic kidney disease, stage 3a: Secondary | ICD-10-CM | POA: Diagnosis present

## 2022-07-13 DIAGNOSIS — Z809 Family history of malignant neoplasm, unspecified: Secondary | ICD-10-CM

## 2022-07-13 LAB — BASIC METABOLIC PANEL
Anion gap: 8 (ref 5–15)
BUN: 13 mg/dL (ref 8–23)
CO2: 27 mmol/L (ref 22–32)
Calcium: 9.1 mg/dL (ref 8.9–10.3)
Chloride: 100 mmol/L (ref 98–111)
Creatinine, Ser: 1.12 mg/dL — ABNORMAL HIGH (ref 0.44–1.00)
GFR, Estimated: 52 mL/min — ABNORMAL LOW (ref >60)
Glucose, Bld: 204 mg/dL — ABNORMAL HIGH (ref 70–99)
Potassium: 4.4 mmol/L (ref 3.5–5.1)
Sodium: 135 mmol/L (ref 135–145)

## 2022-07-13 LAB — CBC
HCT: 38.9 % (ref 36.0–46.0)
Hemoglobin: 12.3 g/dL (ref 12.0–15.0)
MCH: 30.5 pg (ref 26.0–34.0)
MCHC: 31.6 g/dL (ref 30.0–36.0)
MCV: 96.5 fL (ref 80.0–100.0)
Platelets: 384 10*3/uL (ref 150–400)
RBC: 4.03 MIL/uL (ref 3.87–5.11)
RDW: 13.2 % (ref 11.5–15.5)
WBC: 11.3 10*3/uL — ABNORMAL HIGH (ref 4.0–10.5)
nRBC: 0 % (ref 0.0–0.2)

## 2022-07-13 LAB — RESP PANEL BY RT-PCR (RSV, FLU A&B, COVID)  RVPGX2
Influenza A by PCR: NEGATIVE
Influenza B by PCR: NEGATIVE
Resp Syncytial Virus by PCR: POSITIVE — AB
SARS Coronavirus 2 by RT PCR: NEGATIVE

## 2022-07-13 LAB — TROPONIN I (HIGH SENSITIVITY)
Troponin I (High Sensitivity): 5 ng/L (ref <18)
Troponin I (High Sensitivity): 5 ng/L (ref <18)

## 2022-07-13 LAB — PROCALCITONIN: Procalcitonin: <0.1 ng/mL

## 2022-07-13 LAB — CBG MONITORING, ED
Glucose-Capillary: 379 mg/dL — ABNORMAL HIGH (ref 70–99)
Glucose-Capillary: 316 mg/dL — ABNORMAL HIGH (ref 70–99)

## 2022-07-13 MED ORDER — ATORVASTATIN CALCIUM 20 MG PO TABS
40.0000 mg | ORAL_TABLET | Freq: Every day | ORAL | Status: DC
  Administered 2022-07-13 – 2022-07-18 (×6): 40 mg via ORAL
  Filled 2022-07-13 (×6): qty 2

## 2022-07-13 MED ORDER — IPRATROPIUM-ALBUTEROL 0.5-2.5 (3) MG/3ML IN SOLN
3.0000 mL | Freq: Once | RESPIRATORY_TRACT | Status: AC
  Administered 2022-07-13: 3 mL via RESPIRATORY_TRACT
  Filled 2022-07-13: qty 3

## 2022-07-13 MED ORDER — ALBUTEROL SULFATE (2.5 MG/3ML) 0.083% IN NEBU
3.0000 mL | INHALATION_SOLUTION | RESPIRATORY_TRACT | Status: DC | PRN
  Administered 2022-07-13: 3 mL via RESPIRATORY_TRACT
  Filled 2022-07-13: qty 3

## 2022-07-13 MED ORDER — LOSARTAN POTASSIUM 25 MG PO TABS
12.5000 mg | ORAL_TABLET | Freq: Every day | ORAL | Status: DC
  Administered 2022-07-16 – 2022-07-18 (×3): 12.5 mg via ORAL
  Filled 2022-07-13 (×5): qty 1

## 2022-07-13 MED ORDER — ONDANSETRON HCL 4 MG PO TABS: 4.0000 mg | ORAL_TABLET | Freq: Three times a day (TID) | ORAL | Status: DC | PRN

## 2022-07-13 MED ORDER — PREDNISONE 20 MG PO TABS
40.0000 mg | ORAL_TABLET | Freq: Every day | ORAL | Status: AC
  Administered 2022-07-14 – 2022-07-17 (×4): 40 mg via ORAL
  Filled 2022-07-13 (×4): qty 2

## 2022-07-13 MED ORDER — INSULIN GLARGINE-YFGN 100 UNIT/ML ~~LOC~~ SOLN
20.0000 [IU] | Freq: Every day | SUBCUTANEOUS | Status: DC
  Administered 2022-07-14: 20 [IU] via SUBCUTANEOUS
  Filled 2022-07-13 (×2): qty 0.2

## 2022-07-13 MED ORDER — MONTELUKAST SODIUM 10 MG PO TABS
10.0000 mg | ORAL_TABLET | Freq: Every day | ORAL | Status: DC
  Administered 2022-07-13 – 2022-07-17 (×5): 10 mg via ORAL
  Filled 2022-07-13 (×5): qty 1

## 2022-07-13 MED ORDER — GABAPENTIN 300 MG PO CAPS
800.0000 mg | ORAL_CAPSULE | Freq: Three times a day (TID) | ORAL | Status: DC
  Filled 2022-07-13 (×2): qty 2

## 2022-07-13 MED ORDER — PREDNISONE 20 MG PO TABS
20.0000 mg | ORAL_TABLET | Freq: Once | ORAL | Status: AC
  Administered 2022-07-13: 20 mg via ORAL
  Filled 2022-07-13: qty 1

## 2022-07-13 MED ORDER — FLUTICASONE FUROATE-VILANTEROL 100-25 MCG/ACT IN AEPB
1.0000 | INHALATION_SPRAY | Freq: Every day | RESPIRATORY_TRACT | Status: DC
  Administered 2022-07-14 – 2022-07-18 (×5): 1 via RESPIRATORY_TRACT
  Filled 2022-07-13 (×2): qty 28

## 2022-07-13 MED ORDER — APIXABAN 5 MG PO TABS
5.0000 mg | ORAL_TABLET | Freq: Two times a day (BID) | ORAL | Status: DC
  Administered 2022-07-13 – 2022-07-18 (×10): 5 mg via ORAL
  Filled 2022-07-13 (×10): qty 1

## 2022-07-13 MED ORDER — FOLIC ACID 1 MG PO TABS
1.0000 mg | ORAL_TABLET | Freq: Every day | ORAL | Status: DC
  Administered 2022-07-13 – 2022-07-18 (×6): 1 mg via ORAL
  Filled 2022-07-13 (×6): qty 1

## 2022-07-13 MED ORDER — UMECLIDINIUM BROMIDE 62.5 MCG/ACT IN AEPB
1.0000 | INHALATION_SPRAY | Freq: Every day | RESPIRATORY_TRACT | Status: DC
  Administered 2022-07-14 – 2022-07-18 (×5): 1 via RESPIRATORY_TRACT
  Filled 2022-07-13 (×2): qty 7

## 2022-07-13 MED ORDER — SODIUM CHLORIDE 0.9 % IV SOLN
1.0000 g | Freq: Once | INTRAVENOUS | Status: AC
  Administered 2022-07-13: 1 g via INTRAVENOUS
  Filled 2022-07-13: qty 10

## 2022-07-13 MED ORDER — VENLAFAXINE HCL ER 150 MG PO CP24
150.0000 mg | ORAL_CAPSULE | Freq: Every day | ORAL | Status: DC
  Administered 2022-07-13 – 2022-07-18 (×6): 150 mg via ORAL
  Filled 2022-07-13 (×6): qty 1

## 2022-07-13 MED ORDER — PANTOPRAZOLE SODIUM 40 MG PO TBEC
40.0000 mg | DELAYED_RELEASE_TABLET | Freq: Every day | ORAL | Status: DC
  Administered 2022-07-13 – 2022-07-18 (×6): 40 mg via ORAL
  Filled 2022-07-13 (×6): qty 1

## 2022-07-13 MED ORDER — IPRATROPIUM-ALBUTEROL 0.5-2.5 (3) MG/3ML IN SOLN
3.0000 mL | Freq: Four times a day (QID) | RESPIRATORY_TRACT | Status: DC
  Administered 2022-07-13 (×2): 3 mL via RESPIRATORY_TRACT
  Filled 2022-07-13 (×3): qty 3

## 2022-07-13 MED ORDER — INSULIN ASPART 100 UNIT/ML IJ SOLN
0.0000 [IU] | Freq: Three times a day (TID) | INTRAMUSCULAR | Status: DC
  Administered 2022-07-13: 20 [IU] via SUBCUTANEOUS
  Administered 2022-07-14: 3 [IU] via SUBCUTANEOUS
  Administered 2022-07-14: 4 [IU] via SUBCUTANEOUS
  Administered 2022-07-14 – 2022-07-15 (×2): 7 [IU] via SUBCUTANEOUS
  Administered 2022-07-15: 4 [IU] via SUBCUTANEOUS
  Administered 2022-07-15: 11 [IU] via SUBCUTANEOUS
  Administered 2022-07-16: 7 [IU] via SUBCUTANEOUS
  Administered 2022-07-16: 3 [IU] via SUBCUTANEOUS
  Administered 2022-07-16: 20 [IU] via SUBCUTANEOUS
  Administered 2022-07-17: 11 [IU] via SUBCUTANEOUS
  Administered 2022-07-17: 20 [IU] via SUBCUTANEOUS
  Administered 2022-07-17 – 2022-07-18 (×2): 4 [IU] via SUBCUTANEOUS
  Administered 2022-07-18: 11 [IU] via SUBCUTANEOUS
  Filled 2022-07-13 (×15): qty 1

## 2022-07-13 MED ORDER — FUROSEMIDE 40 MG PO TABS
40.0000 mg | ORAL_TABLET | Freq: Every day | ORAL | Status: DC
  Administered 2022-07-13 – 2022-07-14 (×2): 40 mg via ORAL
  Filled 2022-07-13 (×3): qty 1

## 2022-07-13 MED ORDER — AZITHROMYCIN 500 MG PO TABS
500.0000 mg | ORAL_TABLET | Freq: Once | ORAL | Status: AC
  Administered 2022-07-13: 500 mg via ORAL
  Filled 2022-07-13: qty 1

## 2022-07-13 MED ORDER — CYCLOBENZAPRINE HCL 10 MG PO TABS: 5.0000 mg | ORAL_TABLET | Freq: Two times a day (BID) | ORAL | Status: DC | PRN

## 2022-07-13 MED ORDER — METOPROLOL SUCCINATE ER 25 MG PO TB24
12.5000 mg | ORAL_TABLET | Freq: Every day | ORAL | Status: DC
  Administered 2022-07-13 – 2022-07-18 (×4): 12.5 mg via ORAL
  Filled 2022-07-13 (×2): qty 1
  Filled 2022-07-13: qty 0.5
  Filled 2022-07-13 (×3): qty 1

## 2022-07-13 MED ORDER — BUSPIRONE HCL 10 MG PO TABS
5.0000 mg | ORAL_TABLET | Freq: Two times a day (BID) | ORAL | Status: DC
  Administered 2022-07-13 – 2022-07-18 (×10): 5 mg via ORAL
  Filled 2022-07-13 (×10): qty 1

## 2022-07-13 NOTE — H&P (Signed)
History and Physical    Patient: Jordan Chan ZJI:[id number] DOB: Sep 06, 1950 DOA: 07/13/2022 DOS: the patient was seen and examined on 07/13/2022 PCP: [name], NP  Patient coming from: Home  Chief Complaint:  Chief Complaint  Patient presents with   Shortness of Breath   Nasal Congestion   HPI: Jordan Chan is a 71 y.o. female with medical history significant of COPD on 2 L supplemental oxygen at nighttime only, atrial fibrillation on AC, chronic HFrEF with most recent EF of 40-45% (10/2021), hypertension, type 2 diabetes on insulin, hyperlipidemia, GERD, sleep apnea who presents to the ED with complaints of shortness of breath.  Jordan Chan endorses 4-5 day history of gradually worsening SOB. She states she becomes quite dyspneic after 2-3 steps only. She endorses cough and sputum production but believes that may be close to baseline. She denies any known fevers but endorses feeling diaphoretic with chills and nausea. She denies any vomiting, abdominal pain, diarrhea, chest pain, palpitations, lower extremity edema, orthopnea.   ED course: On arrival to the ED, patient was normotensive at 137/50, with heart rate of 67.  She was saturating at 92% on room air.  After 3 DuoNeb treatments, patient desaturated to 87% and required 2 L nasal cannula to improve.  Initial workup remarkable for chest x-ray with no acute abnormalities, however RSV positive.  Troponin negative.  CBC with elevated WBC at 11.3. BMP with no acute electrolyte or renal dysfunction.  TRH contacted for admission for COPD exacerbation in the setting of RSV.  Review of Systems: As mentioned in the history of present illness. All other systems reviewed and are negative.  Past Surgical History:  Procedure Laterality Date   ABDOMINAL HYSTERECTOMY  1987   BACK SURGERY     Tailbone removed following fracture   BREAST SURGERY Right    mastectomy   CARDIAC CATHETERIZATION     CATARACT EXTRACTION W/PHACO  Right 05/19/2019   Procedure: CATARACT EXTRACTION PHACO AND INTRAOCULAR LENS PLACEMENT ([place]), RIGHT, DIABETIC;  Surgeon: Marchia Meiers, MD;  Location: [facility] ORS;  Service: Ophthalmology;  Laterality: Right;  Lot # [id number] H [place]: 00:43.8 CDE: 4.59   CATARACT EXTRACTION W/PHACO Left 06/16/2019   Procedure: CATARACT EXTRACTION PHACO AND INTRAOCULAR LENS PLACEMENT ([place]) LEFT VISION BLUE DIABETIC;  Surgeon: Marchia Meiers, MD;  Location: [facility] ORS;  Service: Ophthalmology;  Laterality: Left;  Lot #[id number] H [place]: 00:46.9 CDE: 6.53   COCCYX REMOVAL     COLONOSCOPY WITH PROPOFOL N/A 01/01/2017   Procedure: COLONOSCOPY WITH PROPOFOL;  Surgeon: Jonathon Bellows, MD;  Location: [facility] ENDOSCOPY;  Service: Endoscopy;  Laterality: N/A;   COLONOSCOPY WITH PROPOFOL N/A 12/11/2021   Procedure: COLONOSCOPY WITH PROPOFOL;  Surgeon: Lin Landsman, MD;  Location: [facility] ENDOSCOPY;  Service: Gastroenterology;  Laterality: N/A;   ELBOW ARTHROSCOPY WITH TENDON RECONSTRUCTION     ESOPHAGOGASTRODUODENOSCOPY (EGD) WITH PROPOFOL N/A 01/01/2017   Procedure: ESOPHAGOGASTRODUODENOSCOPY (EGD) WITH PROPOFOL;  Surgeon: Jonathon Bellows, MD;  Location: [facility] ENDOSCOPY;  Service: Endoscopy;  Laterality: N/A;   ESOPHAGOGASTRODUODENOSCOPY (EGD) WITH PROPOFOL N/A 12/11/2021   Procedure: ESOPHAGOGASTRODUODENOSCOPY (EGD) WITH PROPOFOL;  Surgeon: Lin Landsman, MD;  Location: [facility] ENDOSCOPY;  Service: Gastroenterology;  Laterality: N/A;   INTRATHECAL PUMP IMPLANT     LEFT HEART CATH AND CORONARY ANGIOGRAPHY N/A 04/28/2017   Procedure: LEFT HEART CATH AND CORONARY ANGIOGRAPHY;  Surgeon: [name], MD;  Location: [place] CV LAB;  Service: Cardiovascular;  Laterality: N/A;   MASTECTOMY Right 06/1997   morphine pump  2011  PLANTAR FASCIA RELEASE     TRIGGER FINGER RELEASE     Social History:  reports that she quit smoking about 18 years ago. Her smoking use included cigarettes. She has a 30.00 pack-year smoking history. She has never  used smokeless tobacco. She reports that she does not drink alcohol and does not use drugs.  Allergies  Allergen Reactions   Other Palpitations    IV steroids   Pain Patch [Menthol] Anaphylaxis   Avelox [Moxifloxacin Hcl In Nacl] Other (See Comments)    Upset stomach   Doxycycline Diarrhea   Erythromycin Nausea Only and Other (See Comments)    Can take a Z-Pak just fine Other reaction(s): Other (See Comments) Can take Z-Pak  Can take a Z-Pak just fine   Fentanyl Nausea Only and Rash   Moxifloxacin Hcl Other (See Comments)    Upset stomach Upset stomach   Oxycontin [Oxycodone] Hives   Ozempic [Semaglutide] Nausea Only    Family History  Problem Relation Age of Onset   Cancer Mother 59       lung   Thyroid disease Mother    Cancer Father 37       Pancreatic   Hypertension Sister    Cancer Sister        "cancer on face"   Hyperlipidemia Sister    Osteoporosis Maternal Grandmother    Cancer Paternal Grandmother        colon   Arthritis Paternal Grandfather    Hyperlipidemia Son    Seizures Son    Breast cancer Neg Hx     Prior to Admission medications   Medication Sig Start Date End Date Taking? Authorizing Provider  ACCU-CHEK AVIVA PLUS test strip TEST THREE TIMES DAILY 05/11/21   [name], Henrine Screws T, NP  Accu-Chek Softclix Lancets lancets TEST BLOOD SUGAR THREE TIMES DAILY 05/08/22   [name], Jolene T, NP  albuterol (PROVENTIL HFA;VENTOLIN HFA) 108 (90 BASE) MCG/ACT inhaler Inhale 2 puffs into the lungs every 6 (six) hours as needed for wheezing or shortness of breath.     [provider]  Alcohol Swabs (DROPSAFE ALCOHOL PREP) 70 % PADS USE TWICE DAILY  WITH  SUGAR  CHECKS 11/14/21   [name], Jolene T, NP  apixaban (ELIQUIS) 5 MG TABS tablet Take 1 tablet (5 mg total) by mouth 2 (two) times daily. 06/24/22   [name], Henrine Screws T, NP  atorvastatin (LIPITOR) 40 MG tablet Take 1 tablet (40 mg total) by mouth daily. 06/25/22   [name], Henrine Screws T, NP  Blood Glucose  Monitoring Suppl (TRUE METRIX METER) w/Device KIT Use to check blood sugar 4 times a day 05/14/21   [name], Henrine Screws T, NP  busPIRone (BUSPAR) 5 MG tablet Take 1 tablet (5 mg total) by mouth 2 (two) times daily. 05/20/22   [name], Henrine Screws T, NP  cholecalciferol (VITAMIN D3) 25 MCG (1000 UNIT) tablet Take 1,000 Units by mouth daily.    [provider]  Cyanocobalamin 1000 MCG/ML KIT Inject 1,000 mcg as directed every 30 (thirty) days.    [provider]  cyclobenzaprine (FLEXERIL) 5 MG tablet Take 5 mg by mouth 2 (two) times daily as needed for muscle spasms. Takes very rarely    [provider]  diclofenac (VOLTAREN) 50 MG EC tablet TAKE 1 TABLET THREE TIMES DAILY 02/03/22   [name], Jolene T, NP  Dulaglutide (TRULICITY) 4.5 NF/6.2ZH SOPN Inject 4.5 mg as directed once a week. 03/04/22   [name], Henrine Screws T, NP  folic acid (FOLVITE) 1 MG tablet  TAKE 1 TABLET EVERY DAY 10/14/21   Verlon Au, NP  furosemide (LASIX) 40 MG tablet Take 1 tablet (40 mg total) by mouth daily. 06/24/22   [name], Henrine Screws T, NP  gabapentin (NEURONTIN) 800 MG tablet Take 1 tablet (800 mg total) by mouth 3 (three) times daily. 12/12/21   [name], Henrine Screws T, NP  insulin degludec (TRESIBA FLEXTOUCH) 100 UNIT/ML FlexTouch Pen Inject 50 Units into the skin at bedtime.    [provider]  losartan (COZAAR) 25 MG tablet Take 0.5 tablets (12.5 mg total) by mouth daily. 06/24/22   [name], Henrine Screws T, NP  metFORMIN (GLUCOPHAGE) 1000 MG tablet TAKE 1 TABLET TWICE DAILY WITH MEALS 12/04/21   [name], Henrine Screws T, NP  metoprolol succinate (TOPROL-XL) 25 MG 24 hr tablet Take 0.5 tablets (12.5 mg total) by mouth daily. 06/24/22   [name], Henrine Screws T, NP  montelukast (SINGULAIR) 10 MG tablet Take 1 tablet (10 mg total) by mouth at bedtime. 06/24/22   [name], Henrine Screws T, NP  ondansetron (ZOFRAN) 4 MG tablet Take 1 tablet (4 mg total) by mouth every 8 (eight) hours as needed for nausea or vomiting. 03/14/22   [name], Henrine Screws  T, NP  PAIN MANAGEMENT INTRATHECAL, IT, PUMP 1 each by Intrathecal route. Intrathecal (IT) medication:  Morphine Patient does not remember current. Adjusted every 2.5 months at [facility] in [place].    [provider]  pantoprazole (PROTONIX) 40 MG tablet Take 1 tablet (40 mg total) by mouth daily. 12/12/21   [name], Jolene T, NP  TRELEGY ELLIPTA 100-62.5-25 MCG/ACT AEPB INHALE 1 PUFF INTO THE LUNGS DAILY 12/04/21   [name], Henrine Screws T, NP  venlafaxine XR (EFFEXOR-XR) 150 MG 24 hr capsule Take 1 capsule (150 mg total) by mouth daily. 06/24/22   [name], NP    Physical Exam: Vitals:   07/13/22 1245 07/13/22 1300 07/13/22 1318 07/13/22 1330  BP:  (!) 112/51    Pulse: (!) 108 (!) 109 (!) 117 (!) 115  Resp: _0 Temp:      TempSrc:      SpO2: 100% 100% (!) 87% 97%  Weight:      Height:       Physical Exam Vitals and nursing note reviewed.  Constitutional:      General: She is not in acute distress.    Appearance: She is normal weight. She is not toxic-appearing.  HENT:     Head: Normocephalic and atraumatic.     Mouth/Throat:     Mouth: Mucous membranes are moist.     Pharynx: Oropharynx is clear.  Eyes:     Extraocular Movements: Extraocular movements intact.     Pupils: Pupils are equal, round, and reactive to light.  Neck:     Vascular: No JVD.  Cardiovascular:     Rate and Rhythm: Regular rhythm. Tachycardia present.     Heart sounds: No murmur heard. Pulmonary:     Effort: Pulmonary effort is normal. Tachypnea present. No respiratory distress.     Breath sounds: Wheezing (Diffuse, intermittent expiratory wheezing) and rhonchi (Diffuse bilateral, predominantly lower lung fields) present. No decreased breath sounds or rales.  Abdominal:     Palpations: Abdomen is soft.     Tenderness: There is no abdominal tenderness. There is no guarding.  Musculoskeletal:     Cervical back: Neck supple.     Right lower leg: No tenderness. No  edema.     Left lower leg: No tenderness. No edema.  Skin:    General: Skin is warm and dry.  Neurological:     General: No focal deficit present.     Mental Status: She is alert and oriented to person, place, and time.  Psychiatric:        Mood and Affect: Mood normal.        Behavior: Behavior normal.    Data Reviewed: CBC with WBC elevation at 11.3, hemoglobin 12.3, and platelets of 384. BMP with glucose of 204, creatinine 1.60mand GFR 52. No electrolyte abnormalities. Troponin negative. Covid-19 PCR and influenza PCR negative. RSV PCR positive.   EKG personally reviewed.  Sinus tachycardia with singular PVC noted.  Rate of 108.  DG Chest 2 View  Result Date: 07/13/2022 CLINICAL DATA:  Shortness of breath. EXAM: CHEST - 2 VIEW COMPARISON:  05/07/2022 FINDINGS: The lungs are clear without focal pneumonia, edema, pneumothorax or pleural effusion. Interstitial markings are diffusely coarsened with chronic features. Calcified granuloma right apex is stable. The cardiopericardial silhouette is within normal limits for size. Bones are diffusely demineralized. IMPRESSION: Chronic interstitial coarsening without acute cardiopulmonary findings. Electronically Signed   By: EMisty StanleyM.D.   On: 07/13/2022 10:00    Results are pending, will review when available.  Assessment and Plan: COPD with acute exacerbation ([facility] Patient presenting with several day history of increased shortness of breath with wheezing on examination in the setting of RSV.   - Continue supplemental oxygen to maintain oxygen saturation above 88% - Wean as tolerated - Prednisone 40 mg daily - DuoNebs every 6 hours - Continue home bronchodilators - Pro-calcitonin pending. Hold further antibiotics unless elevated given known viral etiology.   Paroxysmal atrial fibrillation ([facility] Patient has a history of paroxysmal atrial fibrillation on Eliquis.  EKG obtained today with sinus rhythm with tachycardia.  Likely due to  multiple treatments with albuterol and missed Metoprolol dose.   Insulin dependent type 2 diabetes mellitus (HCC) A1c 7.9% approximately 2 weeks ago.   - Hold home medications  - SSI, resistant - Semglee 20 units QHS  Chronic kidney disease, stage III (moderate) ([place] Renal function at baseline  - Monitor renal function while admitted  Hypertension associated with type 2 diabetes mellitus ([place] Blood pressure well controlled at this time.   - Continue home medications   Chronic HFrEF (heart failure with reduced ejection fraction) (HCC) Euvolemic.   - Continue home medications.   Advance Care Planning:   Code Status: Full Code   Consults: None  Family Communication: Patients son updated at bedside.   Severity of Illness: The appropriate patient status for this patient is OBSERVATION. Observation status is judged to be reasonable and necessary in order to provide the required intensity of service to ensure the patient's safety. The patient's presenting symptoms, physical exam findings, and initial radiographic and laboratory data in the context of their medical condition is felt to place them at decreased risk for further clinical deterioration. Furthermore, it is anticipated that the patient will be medically stable for discharge from the hospital within 2 midnights of admission.   Author: IJose Persia MD 07/13/2022 3:03 PM  For on call review [email or web address]

## 2022-07-13 NOTE — ED Notes (Signed)
Pt placed on 2L [place] for oxygen saturation of 85% while sitting in bed. MD notified.

## 2022-07-13 NOTE — Assessment & Plan Note (Signed)
Renal function at baseline  - Monitor renal function while admitted

## 2022-07-13 NOTE — ED Notes (Signed)
Blue top tube sent with other bloodwork.

## 2022-07-13 NOTE — Assessment & Plan Note (Addendum)
Patient has a history of paroxysmal atrial fibrillation on Eliquis.  EKG obtained today with sinus rhythm with tachycardia.  Likely due to multiple treatments with albuterol and missed Metoprolol dose.

## 2022-07-13 NOTE — Assessment & Plan Note (Signed)
Blood pressure well controlled at this time.   - Continue home medications

## 2022-07-13 NOTE — Assessment & Plan Note (Signed)
Euvolemic.   - Continue home medications.

## 2022-07-13 NOTE — Assessment & Plan Note (Addendum)
A1c 7.9% approximately 2 weeks ago.   - Hold home medications  - SSI, resistant - Semglee 20 units QHS

## 2022-07-13 NOTE — Assessment & Plan Note (Addendum)
Patient presenting with several day history of increased shortness of breath with wheezing on examination in the setting of RSV.   - Continue supplemental oxygen to maintain oxygen saturation above 88% - Wean as tolerated - Prednisone 40 mg daily - DuoNebs every 6 hours - Continue home bronchodilators - Pro-calcitonin pending. Hold further antibiotics unless elevated given known viral etiology.

## 2022-07-13 NOTE — ED Notes (Signed)
Report given to A. Calle RN

## 2022-07-13 NOTE — ED Triage Notes (Signed)
Pt to ED for SOB since 4-5 days. Hx COPD. Denies cigarette smoking. States feels worse at night. Has been coughing yellow-green sputum. States has had pneumonia several times. SPO2 is 92% on room air. Appears SOB with 4-5 word sentences. Denies chest pain. Does not appear pale or diaphoretic. R arm is restricted.

## 2022-07-13 NOTE — ED Provider Notes (Signed)
[facility] Provider Note    Event Date/Time   First MD Initiated Contact with Patient 07/13/22 1053     (approximate)   History   Shortness of Breath and Nasal Congestion   HPI  Jordan Chan is a 71 y.o. female who reports shortness of breath and coughing up green and yellow phlegm.  This has been going on for 4 to 5 days.  She says it feels like she is trying to get catching pneumonia.  She has had it several times.  She is not running a fever.  Patient has COPD and asthma and uses oxygen at night but not during the day.   Past medical history: Endometriosis [N80.9]    GERD (gastroesophageal reflux disease) [K21.9]    IBS (irritable bowel syndrome) [K58.9]    Arthritis [M19.90]    COPD (chronic obstructive pulmonary disease) ([place]) [J44.9]    Asthma [J45.909]    Diabetes mellitus without complication ([place]) [G95.6]    Low back pain [M54.50]  a. Implanted morphine/bupivicaine/clonidine pump.  Personal history of chemotherapy [Z92.21]    Personal history of radiation therapy [Z92.3]    Pneumonia [J18.9]  pneumonia 5-6 times, history of bronchitis also  Dyspnea [R06.00]    Sleep apnea [G47.30]  does not use cpap  Oxygen dependent [Z99.81]  uses at night  Hypertension [I10]    Stroke [facility]) [I63.9] 2010 TIA, 10 years ago  Breast cancer [facility]) [C50.919] 1998 right breast ca with mastectomy and chemotherapy and radiation  History of shingles [Z86.19] 2000-2005   Diverticulitis [K57.92]  diverticulosis also  Withdrawal from sedative drug ([place]) [F13.939]  withdrawal from morphine when pump batteries died  Scoliosis [M41.9]    Hypercholesteremia [E78.00]    Bronchitis [J40]    CHF (congestive heart failure) ([place]) [I50.9]  "with Morphine withdrawal"  Neuropathy [G62.9]    Orthopnea [R06.01]    Atrial fibrillation ([place]) [I48.91]      Physical Exam   Triage Vital Signs: ED Triage Vitals  Enc Vitals Group     BP 07/13/22 0942 (!) 137/56     Pulse  Rate 07/13/22 0942 67     Resp 07/13/22 0942 20     Temp 07/13/22 0942 97.7 F (36.5 C)     Temp Source 07/13/22 0942 Oral     SpO2 07/13/22 0938 92 %     Weight 07/13/22 0938 149 lb 14.6 oz (68 kg)     Height 07/13/22 0938 '4\' 10"'$  (1.473 m)     Head Circumference --      Peak Flow --      Pain Score 07/13/22 0938 0     Pain Loc --      Pain Edu? --      Excl. in [place]? --     Most recent vital signs: Vitals:   07/13/22 1300 07/13/22 1318  BP: (!) 112/51   Pulse: (!) 109 (!) 117  Resp: 10 19  Temp:    SpO2: 100% (!) 87%    General: Awake, somewhat short of breath CV:  Good peripheral perfusion.  Heart regular rate and rhythm no audible murmurs Resp:  Somewhat increased effort scattered wheezes and crackles Abd:  No distention.  Soft and nontender    ED Results / Procedures / Treatments   Labs (all labs ordered are listed, but only abnormal results are displayed) Labs Reviewed  RESP PANEL BY RT-PCR (RSV, FLU A&B, COVID)  [id number] - Abnormal; Notable for the following components:  Result Value   Resp Syncytial Virus by PCR POSITIVE (*)    All other components within normal limits  BASIC METABOLIC PANEL - Abnormal; Notable for the following components:   Glucose, Bld 204 (*)    Creatinine, Ser 1.12 (*)    GFR, Estimated 52 (*)    All other components within normal limits  CBC - Abnormal; Notable for the following components:   WBC 11.3 (*)    All other components within normal limits  TROPONIN I (HIGH SENSITIVITY)  TROPONIN I (HIGH SENSITIVITY)     EKG  EKG read interpreted by me shows sinus tachycardia rate of 108 left axis no acute ST-T changes   RADIOLOGY Chest x-ray read by radiology reviewed and interpreted by me does not show any acute findings.  Radiology feels there chronic changes present.   PROCEDURES:  Critical Care performed:   Procedures   MEDICATIONS ORDERED IN ED: Medications  albuterol (PROVENTIL) (2.5 MG/3ML) 0.083% nebulizer  solution 3 mL (3 mLs Inhalation Given 07/13/22 1257)  ipratropium-albuterol (DUONEB) 0.5-2.5 (3) MG/3ML nebulizer solution 3 mL (3 mLs Nebulization Given 07/13/22 1104)  predniSONE (DELTASONE) tablet 20 mg (20 mg Oral Given 07/13/22 1104)  azithromycin (ZITHROMAX) tablet 500 mg (500 mg Oral Given 07/13/22 1104)  ipratropium-albuterol (DUONEB) 0.5-2.5 (3) MG/3ML nebulizer solution 3 mL (3 mLs Nebulization Given 07/13/22 1151)     IMPRESSION / MDM / ASSESSMENT AND PLAN / ED COURSE  I reviewed the triage vital signs and the nursing notes. After third DuoNeb patient becomes hypoxic with sats in the 85 range.  She is positive for RSV as well. Differential diagnosis includes, but is not limited to, pneumonia or COPD and RSV.  Patient's presentation is most consistent with acute presentation with potential threat to life or bodily function.  The patient is on the cardiac monitor to evaluate for evidence of arrhythmia and/or significant heart rate changes.  None have been seen      FINAL CLINICAL IMPRESSION(S) / ED DIAGNOSES   Final diagnoses:  RSV (respiratory syncytial virus infection)  COPD exacerbation ([place])  Hypoxia     Rx / DC Orders   ED Discharge Orders     None        Note:  This document was prepared using Dragon voice recognition software and may include unintentional dictation errors.   Nena Polio, MD 07/13/22 1336

## 2022-07-14 DIAGNOSIS — E7800 Pure hypercholesterolemia, unspecified: Secondary | ICD-10-CM | POA: Diagnosis present

## 2022-07-14 DIAGNOSIS — Z885 Allergy status to narcotic agent status: Secondary | ICD-10-CM | POA: Diagnosis not present

## 2022-07-14 DIAGNOSIS — Z9981 Dependence on supplemental oxygen: Secondary | ICD-10-CM | POA: Diagnosis not present

## 2022-07-14 DIAGNOSIS — Z881 Allergy status to other antibiotic agents status: Secondary | ICD-10-CM | POA: Diagnosis not present

## 2022-07-14 DIAGNOSIS — F419 Anxiety disorder, unspecified: Secondary | ICD-10-CM | POA: Diagnosis present

## 2022-07-14 DIAGNOSIS — J121 Respiratory syncytial virus pneumonia: Secondary | ICD-10-CM | POA: Diagnosis not present

## 2022-07-14 DIAGNOSIS — Z1152 Encounter for screening for COVID-19: Secondary | ICD-10-CM | POA: Diagnosis not present

## 2022-07-14 DIAGNOSIS — E1122 Type 2 diabetes mellitus with diabetic chronic kidney disease: Secondary | ICD-10-CM | POA: Diagnosis present

## 2022-07-14 DIAGNOSIS — N183 Chronic kidney disease, stage 3 unspecified: Secondary | ICD-10-CM | POA: Diagnosis present

## 2022-07-14 DIAGNOSIS — B974 Respiratory syncytial virus as the cause of diseases classified elsewhere: Secondary | ICD-10-CM | POA: Diagnosis present

## 2022-07-14 DIAGNOSIS — B338 Other specified viral diseases: Secondary | ICD-10-CM | POA: Diagnosis present

## 2022-07-14 DIAGNOSIS — J9601 Acute respiratory failure with hypoxia: Secondary | ICD-10-CM | POA: Diagnosis not present

## 2022-07-14 DIAGNOSIS — J9621 Acute and chronic respiratory failure with hypoxia: Secondary | ICD-10-CM | POA: Diagnosis present

## 2022-07-14 DIAGNOSIS — Z888 Allergy status to other drugs, medicaments and biological substances status: Secondary | ICD-10-CM | POA: Diagnosis not present

## 2022-07-14 DIAGNOSIS — E860 Dehydration: Secondary | ICD-10-CM | POA: Diagnosis present

## 2022-07-14 DIAGNOSIS — G894 Chronic pain syndrome: Secondary | ICD-10-CM | POA: Diagnosis present

## 2022-07-14 DIAGNOSIS — Z794 Long term (current) use of insulin: Secondary | ICD-10-CM | POA: Diagnosis not present

## 2022-07-14 DIAGNOSIS — Z7901 Long term (current) use of anticoagulants: Secondary | ICD-10-CM | POA: Diagnosis not present

## 2022-07-14 DIAGNOSIS — I130 Hypertensive heart and chronic kidney disease with heart failure and stage 1 through stage 4 chronic kidney disease, or unspecified chronic kidney disease: Secondary | ICD-10-CM | POA: Diagnosis present

## 2022-07-14 DIAGNOSIS — I5042 Chronic combined systolic (congestive) and diastolic (congestive) heart failure: Secondary | ICD-10-CM | POA: Diagnosis present

## 2022-07-14 DIAGNOSIS — Z79899 Other long term (current) drug therapy: Secondary | ICD-10-CM | POA: Diagnosis not present

## 2022-07-14 DIAGNOSIS — Z87891 Personal history of nicotine dependence: Secondary | ICD-10-CM | POA: Diagnosis not present

## 2022-07-14 DIAGNOSIS — J441 Chronic obstructive pulmonary disease with (acute) exacerbation: Secondary | ICD-10-CM | POA: Diagnosis present

## 2022-07-14 DIAGNOSIS — E1165 Type 2 diabetes mellitus with hyperglycemia: Secondary | ICD-10-CM | POA: Diagnosis present

## 2022-07-14 DIAGNOSIS — F32A Depression, unspecified: Secondary | ICD-10-CM | POA: Diagnosis present

## 2022-07-14 DIAGNOSIS — I480 Paroxysmal atrial fibrillation: Secondary | ICD-10-CM | POA: Diagnosis present

## 2022-07-14 DIAGNOSIS — I482 Chronic atrial fibrillation, unspecified: Secondary | ICD-10-CM | POA: Diagnosis present

## 2022-07-14 LAB — BASIC METABOLIC PANEL
Anion gap: 8 (ref 5–15)
BUN: 14 mg/dL (ref 8–23)
CO2: 29 mmol/L (ref 22–32)
Calcium: 8.5 mg/dL — ABNORMAL LOW (ref 8.9–10.3)
Chloride: 99 mmol/L (ref 98–111)
Creatinine, Ser: 1.02 mg/dL — ABNORMAL HIGH (ref 0.44–1.00)
GFR, Estimated: 58 mL/min — ABNORMAL LOW (ref >60)
Glucose, Bld: 202 mg/dL — ABNORMAL HIGH (ref 70–99)
Potassium: 3.9 mmol/L (ref 3.5–5.1)
Sodium: 136 mmol/L (ref 135–145)

## 2022-07-14 LAB — CBC WITH DIFFERENTIAL/PLATELET
Abs Immature Granulocytes: 0.06 10*3/uL (ref 0.00–0.07)
Basophils Absolute: 0.1 10*3/uL (ref 0.0–0.1)
Basophils Relative: 1 %
Eosinophils Absolute: 0.1 10*3/uL (ref 0.0–0.5)
Eosinophils Relative: 1 %
HCT: 33.4 % — ABNORMAL LOW (ref 36.0–46.0)
Hemoglobin: 10.7 g/dL — ABNORMAL LOW (ref 12.0–15.0)
Immature Granulocytes: 1 %
Lymphocytes Relative: 18 %
Lymphs Abs: 1.9 10*3/uL (ref 0.7–4.0)
MCH: 30.1 pg (ref 26.0–34.0)
MCHC: 32 g/dL (ref 30.0–36.0)
MCV: 93.8 fL (ref 80.0–100.0)
Monocytes Absolute: 1.1 10*3/uL — ABNORMAL HIGH (ref 0.1–1.0)
Monocytes Relative: 10 %
Neutro Abs: 7.6 10*3/uL (ref 1.7–7.7)
Neutrophils Relative %: 69 %
Platelets: 373 10*3/uL (ref 150–400)
RBC: 3.56 MIL/uL — ABNORMAL LOW (ref 3.87–5.11)
RDW: 13.3 % (ref 11.5–15.5)
WBC: 10.9 10*3/uL — ABNORMAL HIGH (ref 4.0–10.5)
nRBC: 0 % (ref 0.0–0.2)

## 2022-07-14 LAB — HIV ANTIBODY (ROUTINE TESTING W REFLEX): HIV Screen 4th Generation wRfx: NONREACTIVE

## 2022-07-14 LAB — GLUCOSE, CAPILLARY
Glucose-Capillary: 149 mg/dL — ABNORMAL HIGH (ref 70–99)
Glucose-Capillary: 199 mg/dL — ABNORMAL HIGH (ref 70–99)
Glucose-Capillary: 224 mg/dL — ABNORMAL HIGH (ref 70–99)

## 2022-07-14 MED ORDER — GABAPENTIN 400 MG PO CAPS
800.0000 mg | ORAL_CAPSULE | Freq: Three times a day (TID) | ORAL | Status: DC
  Administered 2022-07-14 – 2022-07-18 (×14): 800 mg via ORAL
  Filled 2022-07-14 (×14): qty 2

## 2022-07-14 MED ORDER — AMOXICILLIN-POT CLAVULANATE 500-125 MG PO TABS
1.0000 | ORAL_TABLET | Freq: Three times a day (TID) | ORAL | Status: DC
  Administered 2022-07-14 (×2): 1 via ORAL
  Filled 2022-07-14 (×3): qty 1

## 2022-07-14 MED ORDER — IPRATROPIUM-ALBUTEROL 20-100 MCG/ACT IN AERS
1.0000 | INHALATION_SPRAY | Freq: Four times a day (QID) | RESPIRATORY_TRACT | Status: AC
  Administered 2022-07-14 – 2022-07-18 (×15): 1 via RESPIRATORY_TRACT
  Filled 2022-07-14: qty 4

## 2022-07-14 MED ORDER — INSULIN GLARGINE-YFGN 100 UNIT/ML ~~LOC~~ SOLN
23.0000 [IU] | Freq: Every day | SUBCUTANEOUS | Status: DC
  Administered 2022-07-14 – 2022-07-17 (×4): 23 [IU] via SUBCUTANEOUS
  Filled 2022-07-14 (×5): qty 0.23

## 2022-07-14 NOTE — TOC Progression Note (Signed)
Transition of Care [facility]) - Progression Note    Patient Details  Name: Jordan Chan MRN: [id number] Date of Birth: Dec 24, 1950  Transition of Care [facility]) CM/SW [place], RN Phone Number: 07/14/2022, 2:53 PM  Clinical Narrative:    Spoke with the patient, She stated that she has had a notice to move out She has been living with her daughter and uses oxygen at night and gets oxygen thru [employer] and that they are switching but does not know who its thru now but doe snhave oxygen at home She is sleeping in a recliner Her son has a disability and has been staying with a friend temporarily,  I placed low income housing information on her discharge paper work to print at [place] She stated that she does not have any other needs   Expected Discharge Plan: [place] Barriers to Discharge: Continued Medical Work up  Expected Discharge Plan and Services Expected Discharge Plan: [place]   Discharge Planning Services: CM Consult   Living arrangements for the past 2 months: Single Family Home                                       Social Determinants of Health (SDOH) Interventions Housing Interventions: Inpatient TOC  Readmission Risk Interventions     No data to display

## 2022-07-14 NOTE — Progress Notes (Addendum)
PROGRESS NOTE    Jordan Chan  MWU:[id number]  DOB: July 05, 1951  DOA: 07/13/2022 PCP: [name], NP Outpatient Specialists:   Hospital course:  71 year old female with oxygen dependent COPD, atrial fibrillation, HFrEF EF 40 to 45%, DM 2 and HTN was admitted yesterday with COPD exacerbation with RSV, infection.   Subjective:  Patient states she is still quite short of breath.  Notes she finds it hard to speak and breathe at the same time.  Does not feel that she is getting too tired to breathe however wishes that she would be getting better faster.  Patient declines to have inhaled bronchodilators via nebulizer machine.  Patient is concerned about her heart rate.  Discussed the need for inhaled bronchodilators to help with her shortness of breath and work of breathing.  Patient agrees to use MDI and nebulizers.   Objective: Vitals:   07/13/22 2036 07/13/22 2038 07/13/22 2315 07/14/22 0831  BP: (!) 118/52 (!) 118/52 99/60 103/70  Pulse: (!) 104 (!) 110 100 87  Resp: '19 20 20 18  '$ Temp: 98.4 F (36.9 C)  98.4 F (36.9 C) 98.4 F (36.9 C)  TempSrc: Oral  Oral Oral  SpO2: 95% 97% 97% 95%  Weight:      Height:        Intake/Output Summary (Last 24 hours) at 07/14/2022 1401 Last data filed at 07/14/2022 0800 Gross per 24 hour  Intake 360 ml  Output 0 ml  Net 360 ml   Filed Weights   07/13/22 0938  Weight: 68 kg     Exam:  General: Kyphotic female sitting up in bed with tachypnea, mild to moderately, patient able to speak in short sentences before having to take a breath. Eyes: sclera anicteric, conjuctiva mild injection bilaterally CVS: S1-S2, regular  Respiratory:  decreased air entry bilaterally with rare faint high-pitched wheezes in both lung fields. GI: NABS, soft, NT  LE: Warm and well-perfused Neuro: A/O x 3,  grossly nonfocal.  Psych: patient is logical and coherent, judgement and insight appear normal, mood and affect appropriate to  situation.  Data Reviewed:  Basic Metabolic Panel: Recent Labs  Lab 07/13/22 0945 07/14/22 0645  NA 135 136  K 4.4 3.9  CL 100 99  CO2 27 29  GLUCOSE 204* 202*  BUN 13 14  CREATININE 1.12* 1.02*  CALCIUM 9.1 8.5*    CBC: Recent Labs  Lab 07/13/22 0945 07/14/22 0645  WBC 11.3* 10.9*  NEUTROABS  --  7.6  HGB 12.3 10.7*  HCT 38.9 33.4*  MCV 96.5 93.8  PLT 384 373     Scheduled Meds:  apixaban  5 mg Oral BID   atorvastatin  40 mg Oral Daily   busPIRone  5 mg Oral BID   fluticasone furoate-vilanterol  1 puff Inhalation Daily   And   umeclidinium bromide  1 puff Inhalation Daily   folic acid  1 mg Oral Daily   furosemide  40 mg Oral Daily   gabapentin  800 mg Oral TID   insulin aspart  0-20 Units Subcutaneous TID WC   insulin glargine-yfgn  20 Units Subcutaneous QHS   losartan  12.5 mg Oral Daily   metoprolol succinate  12.5 mg Oral Daily   montelukast  10 mg Oral QHS   pantoprazole  40 mg Oral Daily   predniSONE  40 mg Oral Q breakfast   venlafaxine XR  150 mg Oral Daily   Continuous Infusions:   Assessment & Plan:  Acute on chronic hypoxic respiratory failure complicated by RSV infection Patient states she is minimally improved since yesterday however patient continues to decline inhaled bronchodilators via nebulizer.  She does agree to use MDI which will be delivered to her at bedside, discussed with RN.  Patient is aware that inhaled bronchodilators are essential to her recovery. Continue prednisone 40 mg daily Encourage MDI use every 6 hours Procalcitonin is negative however patient meets gold criteria send will start patient on Augmentin, patient has intolerance to azithromycin for moxifloxacin. Continue montelukast   PAF Patient continues on XL 12.5 daily Eliquis for stroke prevention  HFrEF Did not hear rales on lung exam, no pedal edema However patient has very tenuous and limited respiratory reserve, would want to avoid any exacerbation or  have probability present she appears to be euvolemic. Continue Lasix 40 mg daily  DM 2 Blood sugars improved since yesterday although not quite optimal Increase glargine to 23 units, up from 20 units Continue SSI  HTN Blood pressure is normal Patient needs Toprol XL for PAF rate control She is on low-dose losartan, this can be discontinued if BP falls  Anxiety and depression Continue buspirone and venlafaxine   DVT prophylaxis: Eliquis Code Status: Full Family Communication: none today Disposition Plan:   Patient is from:  home  Anticipated Discharge Location:  home  Barriers to Discharge:  ongoing respiratory compromise   Studies: DG Chest 2 View  Result Date: 07/13/2022 CLINICAL DATA:  Shortness of breath. EXAM: CHEST - 2 VIEW COMPARISON:  05/07/2022 FINDINGS: The lungs are clear without focal pneumonia, edema, pneumothorax or pleural effusion. Interstitial markings are diffusely coarsened with chronic features. Calcified granuloma right apex is stable. The cardiopericardial silhouette is within normal limits for size. Bones are diffusely demineralized. IMPRESSION: Chronic interstitial coarsening without acute cardiopulmonary findings. Electronically Signed   By: Misty Stanley M.D.   On: 07/13/2022 10:00    Active Problems:   COPD with acute exacerbation (HCC)   Paroxysmal atrial fibrillation (HCC)   Insulin dependent type 2 diabetes mellitus ([place])   Chronic kidney disease, stage III (moderate) ([place])   Hypertension associated with type 2 diabetes mellitus ([place])   Chronic HFrEF (heart failure with reduced ejection fraction) ([place])     Shay Jhaveri Derek Jack, [facility]  If 7PM-7AM, please contact night-coverage [email or web address]   LOS: 0 days

## 2022-07-14 NOTE — Discharge Instructions (Signed)
Rent/Utility/Housing  Agency Name: [facility] Agency Address: [address] [place], [place] [address] Phone: [phone] Email: [email or web address] Website: [email or web address] Service(s) Offered: Housing services, self-sufficiency, congregate meal  program, weatherization program, Administrator, sports program, emergency food assistance,  housing counseling, home ownership program, wheels -towork program.  Agency Name: [facility] Address: [id number], [place], [place] [address] Phone: [phone] (8a-[phone] (8p- 10p) Email: [email or web address] Website: [email or web address] Service(s) Offered: A program for homeless and/or needy men that includes one-on-one counseling, life skills training and job rehabilitation.  Agency Name: [facility] [place] Address: [address] [place], [place] [address] Phone: [phone] Website: [email or web address] Service(s) Offered: Assistance to needy in emergency with utility bills, heating  fuel, and prescriptions. Shelter for homeless 7pm-7am. November 27, 2016 15  Agency Name: [facility] [place] [facility]Developmentally Disabled) Address: [address], [place], [place] [address] Phone: [phone] Contact Person: Susanne Greenhouse Email: [email or web address] Website: [email or web address] Service(s) Offered: Helps individuals with developmental disabilities move  from housing that is more restrictive to homes where they  can achieve greater independence and have more  opportunities.  Agency Name: [employer] Address: [address], [place], [place] [address] Phone: [phone] Email: [email or web address] Website: www.[email or web address].org Service(s) Offered: Provides affordable housing for low-income families,  elderly, and disabled individuals. Offer a wide range of  programs and services, from financial planning  to afterschool and summer programs.  Agency Name: [facility] Address: [address] [place], [place] [address] Phone: [phone] Service(s) Offered: Child support services; child welfare services; food stamps;  Medicaid; work first family assistance; and aid with fuel,  rent, food and medicine.  Agency Name: [facility] [place]. Address: [facility] [address], [place], [place]  [address] Phone: [phone] Website: www.familyabuseservices.org Service(s) Offered: 24 hour Crisis Line: [phone]; 24 hour Emergency Shelter;  Transitional Housing; Support Groups; Lexicographer;  [employer]; Hispanic Outreach: 6788701182;  [place]: [phone]. November 27, 2016 16  Agency Name: [facility] Address: [address] [place], [place] [address] Phone: [phone] Service(s) Offered: CAP Services; Home and [employer]; Individual  or Group Supports; Respite Care Non-Institutional Nursing;  Residential Supports; Respite Care and [place]; Transportation; Family and Friends Night;  Recreational Activities; Three Nutritious Meals/Snacks;  Consultation with Registered Dietician; Twenty-four hour  Registered Nurse Access; Daily and [employer]; [place]; [place] for the [employer] (During Summer Months) Bingo Night (Every  Wednesday Night); Special Populations Dance Night  (Every Tuesday Night); Professional Hair Care Services.  Agency Name: [facility] Address: [address], [place], [place] [address] Phone: [phone] Contact Person: Flonnie Hailstone Website: [email or web address].Pharmacist, hospital) Offered: Residential treatment facility for women; food and  clothing, educational & employment development and  transportation to work; Psychologist, counselling of financial skills;  parenting and family  reunification; emotional and spiritual  support; transitional housing for program graduates.  Agency Name: [employer] Address: [address], [place], [place] [address] Phone: [phone] Email: [email or web address] Website: [email or web address] Service(s) Offered: Public housing units for elderly, disabled, and low income  people; housing choice vouchers for income eligible  applicants; shelter plus care vouchers; and [employer] program. November 27, 2016 17  Agency Name: [facility] Address: [place] [address] [place], [place] [address] Phone: [phone] Email: [email or web address] Website: www.habitatalamance.org Service(s) Offered: Build houses for families in need of decent housing. Each  adult in the family must invest 200 hours of labor on  someone else's house, work with volunteers to build their  own house, attend classes on budgeting, home maintenance, yard care, and attend homeowner association  meetings.  Agency Name: [name] Address: [place] [address] [place], [place] [address] Phone: [phone] Website: www.rsli.org Service(s) Offered: Intermediate care facilities for mentally retarded,  Supervised Living in group homes for adults with  developmental disabilities, Supervised Living for people  who have dual diagnoses (MRMI), Independent Living,  Supported Living, respite and a variety of CAP services,  pre-vocational services, day supports, and [employer].  Agency Name: [place] Phone: [phone] Website: www.NCForeclosurePrevention.gov Service(s) Offered: Zero-interest, deferred loans to homeowners struggling to  pay their mortgage. Call for more information

## 2022-07-15 ENCOUNTER — Inpatient Hospital Stay: Payer: Medicare HMO

## 2022-07-15 DIAGNOSIS — J9601 Acute respiratory failure with hypoxia: Secondary | ICD-10-CM | POA: Diagnosis not present

## 2022-07-15 LAB — GLUCOSE, CAPILLARY
Glucose-Capillary: 225 mg/dL — ABNORMAL HIGH (ref 70–99)
Glucose-Capillary: 169 mg/dL — ABNORMAL HIGH (ref 70–99)
Glucose-Capillary: 203 mg/dL — ABNORMAL HIGH (ref 70–99)
Glucose-Capillary: 295 mg/dL — ABNORMAL HIGH (ref 70–99)
Glucose-Capillary: 228 mg/dL — ABNORMAL HIGH (ref 70–99)

## 2022-07-15 LAB — CBC
HCT: 35.2 % — ABNORMAL LOW (ref 36.0–46.0)
Hemoglobin: 11.3 g/dL — ABNORMAL LOW (ref 12.0–15.0)
MCH: 30.5 pg (ref 26.0–34.0)
MCHC: 32.1 g/dL (ref 30.0–36.0)
MCV: 95.1 fL (ref 80.0–100.0)
Platelets: 384 10*3/uL (ref 150–400)
RBC: 3.7 MIL/uL — ABNORMAL LOW (ref 3.87–5.11)
RDW: 13.1 % (ref 11.5–15.5)
WBC: 13.8 10*3/uL — ABNORMAL HIGH (ref 4.0–10.5)
nRBC: 0 % (ref 0.0–0.2)

## 2022-07-15 MED ORDER — SODIUM CHLORIDE 0.9 % IV SOLN
2.0000 g | INTRAVENOUS | Status: AC
  Administered 2022-07-15 – 2022-07-17 (×3): 2 g via INTRAVENOUS
  Filled 2022-07-15 (×3): qty 2

## 2022-07-15 MED ORDER — POTASSIUM CHLORIDE CRYS ER 20 MEQ PO TBCR
20.0000 meq | EXTENDED_RELEASE_TABLET | Freq: Once | ORAL | Status: AC
  Administered 2022-07-15: 20 meq via ORAL
  Filled 2022-07-15: qty 1

## 2022-07-15 MED ORDER — GUAIFENESIN ER 600 MG PO TB12
1200.0000 mg | ORAL_TABLET | Freq: Two times a day (BID) | ORAL | Status: DC
  Administered 2022-07-15 – 2022-07-18 (×7): 1200 mg via ORAL
  Filled 2022-07-15 (×7): qty 2

## 2022-07-15 MED ORDER — FUROSEMIDE 10 MG/ML IJ SOLN
40.0000 mg | Freq: Every day | INTRAMUSCULAR | Status: DC
  Administered 2022-07-15 – 2022-07-17 (×3): 40 mg via INTRAVENOUS
  Filled 2022-07-15 (×3): qty 4

## 2022-07-15 NOTE — Plan of Care (Signed)
  Problem: Skin Integrity: Goal: Risk for impaired skin integrity will decrease Outcome: Progressing   Problem: Education: Goal: Knowledge of General Education information will improve Description: Including pain rating scale, medication(s)/side effects and non-pharmacologic comfort measures Outcome: Progressing   Problem: Activity: Goal: Risk for activity intolerance will decrease Outcome: Progressing   Problem: Nutrition: Goal: Adequate nutrition will be maintained Outcome: Progressing   Problem: Safety: Goal: Ability to remain free from injury will improve Outcome: Progressing

## 2022-07-15 NOTE — Progress Notes (Signed)
PROGRESS NOTE    Jordan Chan  PNT:[id number] DOB: 09/26/1950 DOA: 07/13/2022 PCP: [name], NP   Brief Narrative: 71 year old oxygen dependent COPD, A fib, HFrEF 40-45 %, DM type 2, HTN admitted with COPD exacerbation in the setting of RSV infection.    Assessment & Plan:   Principal Problem:   Acute respiratory failure with hypoxia (HCC) Active Problems:   COPD with acute exacerbation (HCC)   Paroxysmal atrial fibrillation (HCC)   Insulin dependent type 2 diabetes mellitus (HCC)   Chronic kidney disease, stage III (moderate) ([place])   Hypertension associated with type 2 diabetes mellitus (HCC)   Chronic HFrEF (heart failure with reduced ejection fraction) (HCC)  1-Acute on Chronic Hypoxic Failure, complicated by RSV:  -Continue to complain of shortness of breath, cough, inability to cough secretions. -She has been now using MDI. -She reports upset stomach, change oral antibiotics to IV ceftriaxone -Will change oral Lasix to IV, she has crackers worse on the right side -Continue with prednisone. -Supportive care for RSV -Flutter valve and start guaifenesin -Repeated chest x-ray 12/12 negative.  2-PAF: Continue with Eliquis and metoprolol  HFrEF  Change oral Lasix to IV, he has bilateral crackers Continue with Cozaar  Diabetes type 2: Continue with an sliding scale insulin   Hypertension: Continue with metoprolol and Cozaar  Anxiety and depression: Continue with BuSpar, gabapentin, Effexor      Estimated body mass index is 31.33 kg/m as calculated from the following:   Height as of this encounter: '4\' 10"'$  (1.473 m).   Weight as of this encounter: 68 kg.   DVT prophylaxis: Eliquis Code Status: Full code Family Communication: Care discussed with patient Disposition Plan:  Status is: Inpatient Remains inpatient appropriate because: Management of acute hypoxic respiratory failure, RSV    Consultants:  None  Procedures:   None  Antimicrobials:  IV ceftriaxone.   Subjective: She is alert, she is not feeling well yet, continue to complaining of cough, SOB, inability to cough phlegm.   Objective: Vitals:   07/13/22 2038 07/13/22 2315 07/14/22 0831 07/15/22 0046  BP: (!) 118/52 99/60 103/70 125/72  Pulse: (!) 110 100 87 80  Resp: '20 20 18 20  '$ Temp:  98.4 F (36.9 C) 98.4 F (36.9 C) (!) 97.5 F (36.4 C)  TempSrc:  Oral Oral   SpO2: 97% 97% 95% 99%  Weight:      Height:        Intake/Output Summary (Last 24 hours) at 07/15/2022 0739 Last data filed at 07/15/2022 0615 Gross per 24 hour  Intake 240 ml  Output 400 ml  Net -160 ml   Filed Weights   07/13/22 0938  Weight: 68 kg    Examination:  General exam: Appears calm and comfortable  Respiratory system: Respiratory effort normal. Bilateral crackles, ronchus.  Cardiovascular system: S1 & S2 heard, RRR. No JVD, murmurs, rubs, gallops or clicks. No pedal edema. Gastrointestinal system: Abdomen is nondistended, soft and nontender. No organomegaly or masses felt. Normal bowel sounds heard. Central nervous system: Alert and oriented. No focal neurological deficits. Extremities: Symmetric 5 x 5 power.    Data Reviewed: I have personally reviewed following labs and imaging studies  CBC: Recent Labs  Lab 07/13/22 0945 07/14/22 0645 07/15/22 0241  WBC 11.3* 10.9* 13.8*  NEUTROABS  --  7.6  --   HGB 12.3 10.7* 11.3*  HCT 38.9 33.4* 35.2*  MCV 96.5 93.8 95.1  PLT 384 373 086   Basic Metabolic Panel:  Recent Labs  Lab 07/13/22 0945 07/14/22 0645  NA 135 136  K 4.4 3.9  CL 100 99  CO2 27 29  GLUCOSE 204* 202*  BUN 13 14  CREATININE 1.12* 1.02*  CALCIUM 9.1 8.5*   GFR: Estimated Creatinine Clearance: 40.7 mL/min (A) (by C-G formula based on SCr of 1.02 mg/dL (H)). Liver Function Tests: No results for input(s): "AST", "ALT", "ALKPHOS", "BILITOT", "PROT", "ALBUMIN" in the last 168 hours. No results for input(s): "LIPASE",  "AMYLASE" in the last 168 hours. No results for input(s): "AMMONIA" in the last 168 hours. Coagulation Profile: No results for input(s): "INR", "PROTIME" in the last 168 hours. Cardiac Enzymes: No results for input(s): "CKTOTAL", "CKMB", "CKMBINDEX", "TROPONINI" in the last 168 hours. BNP (last 3 results) No results for input(s): "PROBNP" in the last 8760 hours. HbA1C: No results for input(s): "HGBA1C" in the last 72 hours. CBG: Recent Labs  Lab 07/13/22 1733 07/13/22 2026 07/14/22 0803 07/14/22 1144 07/14/22 1629  GLUCAP 379* 316* 149* 199* 224*   Lipid Profile: No results for input(s): "CHOL", "HDL", "LDLCALC", "TRIG", "CHOLHDL", "LDLDIRECT" in the last 72 hours. Thyroid Function Tests: No results for input(s): "TSH", "T4TOTAL", "FREET4", "T3FREE", "THYROIDAB" in the last 72 hours. Anemia Panel: No results for input(s): "VITAMINB12", "FOLATE", "FERRITIN", "TIBC", "IRON", "RETICCTPCT" in the last 72 hours. Sepsis Labs: Recent Labs  Lab 07/13/22 1355  PROCALCITON <0.10    Recent Results (from the past 240 hour(s))  Resp panel by RT-PCR (RSV, Flu A&B, Covid) Anterior Nasal Swab     Status: Abnormal   Collection Time: 07/13/22 11:00 AM   Specimen: Anterior Nasal Swab  Result Value Ref Range Status   SARS Coronavirus 2 by RT PCR NEGATIVE NEGATIVE Final    Comment: (NOTE) SARS-CoV-2 target nucleic acids are NOT DETECTED.  The SARS-CoV-2 RNA is generally detectable in upper respiratory specimens during the acute phase of infection. The lowest concentration of SARS-CoV-2 viral copies this assay can detect is 138 copies/mL. A negative result does not preclude SARS-Cov-2 infection and should not be used as the sole basis for treatment or other patient management decisions. A negative result may occur with  improper specimen collection/handling, submission of specimen other than nasopharyngeal swab, presence of viral mutation(s) within the areas targeted by this assay, and  inadequate number of viral copies(<138 copies/mL). A negative result must be combined with clinical observations, patient history, and epidemiological information. The expected result is Negative.  Fact Sheet for Patients:  EntrepreneurPulse.com.au  Fact Sheet for Healthcare Providers:  IncredibleEmployment.be  This test is no t yet approved or cleared by the [place] FDA and  has been authorized for detection and/or diagnosis of SARS-CoV-2 by FDA under an Emergency Use Authorization (EUA). This EUA will remain  in effect (meaning this test can be used) for the duration of the COVID-19 declaration under Section 564(b)(1) of the Act, 21 U.S.C.section 360bbb-3(b)(1), unless the authorization is terminated  or revoked sooner.       Influenza A by PCR NEGATIVE NEGATIVE Final   Influenza B by PCR NEGATIVE NEGATIVE Final    Comment: (NOTE) The Xpert Xpress SARS-CoV-2/FLU/RSV plus assay is intended as an aid in the diagnosis of influenza from Nasopharyngeal swab specimens and should not be used as a sole basis for treatment. Nasal washings and aspirates are unacceptable for Xpert Xpress SARS-CoV-2/FLU/RSV testing.  Fact Sheet for Patients: EntrepreneurPulse.com.au  Fact Sheet for Healthcare Providers: IncredibleEmployment.be  This test is not yet approved or cleared by the [place]  FDA and has been authorized for detection and/or diagnosis of SARS-CoV-2 by FDA under an Emergency Use Authorization (EUA). This EUA will remain in effect (meaning this test can be used) for the duration of the COVID-19 declaration under Section 564(b)(1) of the Act, 21 U.S.C. section 360bbb-3(b)(1), unless the authorization is terminated or revoked.     Resp Syncytial Virus by PCR POSITIVE (A) NEGATIVE Final    Comment: (NOTE) Fact Sheet for Patients: EntrepreneurPulse.com.au  Fact Sheet for  Healthcare Providers: IncredibleEmployment.be  This test is not yet approved or cleared by the [place] FDA and has been authorized for detection and/or diagnosis of SARS-CoV-2 by FDA under an Emergency Use Authorization (EUA). This EUA will remain in effect (meaning this test can be used) for the duration of the COVID-19 declaration under Section 564(b)(1) of the Act, 21 U.S.C. section 360bbb-3(b)(1), unless the authorization is terminated or revoked.  Performed at [facility], [address] [place], [place] [address]          Radiology Studies: DG Chest 2 View  Result Date: 07/13/2022 CLINICAL DATA:  Shortness of breath. EXAM: CHEST - 2 VIEW COMPARISON:  05/07/2022 FINDINGS: The lungs are clear without focal pneumonia, edema, pneumothorax or pleural effusion. Interstitial markings are diffusely coarsened with chronic features. Calcified granuloma right apex is stable. The cardiopericardial silhouette is within normal limits for size. Bones are diffusely demineralized. IMPRESSION: Chronic interstitial coarsening without acute cardiopulmonary findings. Electronically Signed   By: Misty Stanley M.D.   On: 07/13/2022 10:00        Scheduled Meds:  amoxicillin-clavulanate  1 tablet Oral TID   apixaban  5 mg Oral BID   atorvastatin  40 mg Oral Daily   busPIRone  5 mg Oral BID   fluticasone furoate-vilanterol  1 puff Inhalation Daily   And   umeclidinium bromide  1 puff Inhalation Daily   folic acid  1 mg Oral Daily   furosemide  40 mg Oral Daily   gabapentin  800 mg Oral TID   insulin aspart  0-20 Units Subcutaneous TID WC   insulin glargine-yfgn  23 Units Subcutaneous QHS   Ipratropium-Albuterol  1 puff Inhalation Q6H   losartan  12.5 mg Oral Daily   metoprolol succinate  12.5 mg Oral Daily   montelukast  10 mg Oral QHS   pantoprazole  40 mg Oral Daily   predniSONE  40 mg Oral Q breakfast   venlafaxine XR  150 mg Oral Daily    Continuous Infusions:   LOS: 1 day    Time spent: 35 minutes.     Elmarie Shiley, MD [facility]   If 7PM-7AM, please contact night-coverage [email or web address]  07/15/2022, 7:39 AM

## 2022-07-15 NOTE — Evaluation (Signed)
Physical Therapy Evaluation Patient Details Chan: Jordan Chan MRN: [id number] DOB: 21-Feb-1951 Today's Date: 07/15/2022  History of Present Illness  Pt admitted for ARF with hypoxia in addition to RSV. History includes COPD, Afib, DM, and HTN. Pt currently wears O2 nocturally.  Clinical Impression  Pt is a pleasant 71 year old female who was admitted for ARF with hypoxia and RSV. Pt demonstrates all bed mobility/transfers/ambulation at baseline level. Reports she doesn't feel that she has acute needs at this time. Pt does not require any further PT needs at this time. Pt will be dc in house and does not require follow up. RN aware. Will dc current orders. SaO2 on room air at rest = 96% SaO2 on room air while ambulating = 90% SaO2 on n/a liters of O2 while ambulating = n/a%       Recommendations for follow up therapy are one component of a multi-disciplinary discharge planning process, led by the attending physician.  Recommendations may be updated based on patient status, additional functional criteria and insurance authorization.  Follow Up Recommendations No PT follow up      Assistance Recommended at Discharge PRN  Patient can return home with the following  Assistance with cooking/housework    Equipment Recommendations None recommended by PT  Recommendations for Other Services       Functional Status Assessment Patient has not had a recent decline in their functional status     Precautions / Restrictions Precautions Precautions: None Restrictions Weight Bearing Restrictions: No      Mobility  Bed Mobility Overal bed mobility: Independent                  Transfers Overall transfer level: Independent                      Ambulation/Gait Ambulation/Gait assistance: Independent Gait Distance (Feet): 50 Feet Assistive device: None Gait Pattern/deviations: WFL(Within Functional Limits)       General Gait Details: ambulated performed in  room secondary to isolation. Pt limits distance secondary to SOB. No issues with balance.  Stairs            Wheelchair Mobility    Modified Rankin (Stroke Patients Only)       Balance Overall balance assessment: Independent                                           Pertinent Vitals/Pain Pain Assessment Pain Assessment: No/denies pain    Home Living Family/patient expects to be discharged to:: Private residence Living Arrangements: Children (daughter and her family) Available Help at Discharge: Family;Available PRN/intermittently Type of Home: House             Additional Comments: pt recently evicted from current home and is staying with daughter in recliner. Looking for more permanent living situation    Prior Function Prior Level of Function : Independent/Modified Independent             Mobility Comments: indep without device. No falls ADLs Comments: Ind with ADLs     Hand Dominance        Extremity/Trunk Assessment   Upper Extremity Assessment Upper Extremity Assessment: Overall WFL for tasks assessed    Lower Extremity Assessment Lower Extremity Assessment: Overall WFL for tasks assessed       Communication   Communication: No difficulties  Cognition Arousal/Alertness: Awake/alert  Behavior During Therapy: WFL for tasks assessed/performed Overall Cognitive Status: Within Functional Limits for tasks assessed                                          General Comments      Exercises Other Exercises Other Exercises: ambulated around room on RA with no AD and independence. Safe technique   Assessment/Plan    PT Assessment Patient does not need any further PT services  PT Problem List         PT Treatment Interventions      PT Goals (Current goals can be found in the Care Plan section)  Acute Rehab PT Goals Patient Stated Goal: to go home PT Goal Formulation: All assessment and education  complete, DC therapy Time For Goal Achievement: 07/15/22 Potential to Achieve Goals: Good    Frequency       Co-evaluation               AM-PAC PT "6 Clicks" Mobility  Outcome Measure Help needed turning from your back to your side while in a flat bed without using bedrails?: None Help needed moving from lying on your back to sitting on the side of a flat bed without using bedrails?: None Help needed moving to and from a bed to a chair (including a wheelchair)?: None Help needed standing up from a chair using your arms (e.g., wheelchair or bedside chair)?: None Help needed to walk in hospital room?: None Help needed climbing 3-5 steps with a railing? : A Little 6 Click Score: 23    End of Session Equipment Utilized During Treatment: Oxygen Activity Tolerance: Patient tolerated treatment well Patient left: in bed Nurse Communication: Mobility status PT Visit Diagnosis: Difficulty in walking, not elsewhere classified (R26.2)    Time: 7591-6384 PT Time Calculation (min) (ACUTE ONLY): 13 min   Charges:   PT Evaluation $PT Eval Low Complexity: 1 Low          Greggory Stallion, PT, DPT, GCS [phone]   Jordan Chan 07/15/2022, 2:10 PM

## 2022-07-16 DIAGNOSIS — J441 Chronic obstructive pulmonary disease with (acute) exacerbation: Secondary | ICD-10-CM | POA: Diagnosis not present

## 2022-07-16 DIAGNOSIS — J121 Respiratory syncytial virus pneumonia: Secondary | ICD-10-CM | POA: Diagnosis not present

## 2022-07-16 LAB — BASIC METABOLIC PANEL
Anion gap: 10 (ref 5–15)
BUN: 28 mg/dL — ABNORMAL HIGH (ref 8–23)
CO2: 28 mmol/L (ref 22–32)
Calcium: 8.7 mg/dL — ABNORMAL LOW (ref 8.9–10.3)
Chloride: 96 mmol/L — ABNORMAL LOW (ref 98–111)
Creatinine, Ser: 1.29 mg/dL — ABNORMAL HIGH (ref 0.44–1.00)
GFR, Estimated: 44 mL/min — ABNORMAL LOW (ref >60)
Glucose, Bld: 252 mg/dL — ABNORMAL HIGH (ref 70–99)
Potassium: 3.8 mmol/L (ref 3.5–5.1)
Sodium: 134 mmol/L — ABNORMAL LOW (ref 135–145)

## 2022-07-16 LAB — CBC
HCT: 37.9 % (ref 36.0–46.0)
Hemoglobin: 12.1 g/dL (ref 12.0–15.0)
MCH: 30.2 pg (ref 26.0–34.0)
MCHC: 31.9 g/dL (ref 30.0–36.0)
MCV: 94.5 fL (ref 80.0–100.0)
Platelets: 457 10*3/uL — ABNORMAL HIGH (ref 150–400)
RBC: 4.01 MIL/uL (ref 3.87–5.11)
RDW: 12.9 % (ref 11.5–15.5)
WBC: 15.4 10*3/uL — ABNORMAL HIGH (ref 4.0–10.5)
nRBC: 0 % (ref 0.0–0.2)

## 2022-07-16 LAB — GLUCOSE, CAPILLARY
Glucose-Capillary: 241 mg/dL — ABNORMAL HIGH (ref 70–99)
Glucose-Capillary: 129 mg/dL — ABNORMAL HIGH (ref 70–99)
Glucose-Capillary: 367 mg/dL — ABNORMAL HIGH (ref 70–99)
Glucose-Capillary: 400 mg/dL — ABNORMAL HIGH (ref 70–99)
Glucose-Capillary: 229 mg/dL — ABNORMAL HIGH (ref 70–99)

## 2022-07-16 MED ORDER — HYDROCOD POLI-CHLORPHE POLI ER 10-8 MG/5ML PO SUER
5.0000 mL | Freq: Two times a day (BID) | ORAL | Status: DC | PRN
  Administered 2022-07-16 – 2022-07-17 (×2): 5 mL via ORAL
  Filled 2022-07-16 (×2): qty 5

## 2022-07-16 NOTE — Plan of Care (Signed)

## 2022-07-16 NOTE — Progress Notes (Addendum)
PROGRESS NOTE    BHAVIKA SCHNIDER  OZD:[id number]  DOB: February 17, 1951  DOA: 07/13/2022 PCP: [name], NP Outpatient Specialists:   Hospital course:  71 year old female with oxygen dependent COPD, atrial fibrillation, HFrEF EF 40 to 45%, DM 2 and HTN was admitted yesterday with COPD exacerbation with RSV, infection.   Subjective:  Patient states that she is doing much better than she had been doing the past couple of days.  Admits she still quite short of breath however she can see improvement.  States she still very weak.  Patient tells me she is very distressed because she had to move out of her home of 12 years because her landlord sold the house that she was living in.  She is now separated from her son who has cognitive impairment who she has taken care of all of his life.  She is very distressed that she is having a hard time finding a new place to live because it is dependent on having Internet skills which she does not have.  Patient is presently living with her daughter and 4 grandchildren, daughter works 2 jobs and is unable to help her with her search for accommodation   Objective: Vitals:   07/15/22 0826 07/15/22 1627 07/15/22 2340 07/16/22 0759  BP: 105/61 119/70 129/66 125/71  Pulse: 75 93 94 87  Resp: '16 16 17 20  '$ Temp: 97.6 F (36.4 C) (!) 97.5 F (36.4 C) 98.4 F (36.9 C) 97.7 F (36.5 C)  TempSrc:      SpO2: 99% 99% 98% 95%  Weight:      Height:        Intake/Output Summary (Last 24 hours) at 07/16/2022 1652 Last data filed at 07/16/2022 1547 Gross per 24 hour  Intake --  Output 1000 ml  Net -1000 ml    Filed Weights   07/13/22 0938  Weight: 68 kg     Exam:  General: Kyphotic female sitting up in bed unlabored tachypnea, able to speak in full sentences before she has to take a breath. Eyes: sclera anicteric, conjuctiva mild injection bilaterally CVS: S1-S2, regular  Respiratory: Coarse breath sounds in both lung fields without  areas of focal consolidation. GI: NABS, soft, NT  LE: Warm and well-perfused Neuro: A/O x 3,  grossly nonfocal.  Psych: patient is logical and coherent, judgement and insight appear normal, mood and affect appropriate to situation.  Data Reviewed:  Basic Metabolic Panel: Recent Labs  Lab 07/13/22 0945 07/14/22 0645 07/16/22 0243  NA 135 136 134*  K 4.4 3.9 3.8  CL 100 99 96*  CO2 '27 29 28  '$ GLUCOSE 204* 202* 252*  BUN 13 14 28*  CREATININE 1.12* 1.02* 1.29*  CALCIUM 9.1 8.5* 8.7*     CBC: Recent Labs  Lab 07/13/22 0945 07/14/22 0645 07/15/22 0241 07/16/22 0243  WBC 11.3* 10.9* 13.8* 15.4*  NEUTROABS  --  7.6  --   --   HGB 12.3 10.7* 11.3* 12.1  HCT 38.9 33.4* 35.2* 37.9  MCV 96.5 93.8 95.1 94.5  PLT 384 373 384 457*      Scheduled Meds:  apixaban  5 mg Oral BID   atorvastatin  40 mg Oral Daily   busPIRone  5 mg Oral BID   fluticasone furoate-vilanterol  1 puff Inhalation Daily   And   umeclidinium bromide  1 puff Inhalation Daily   folic acid  1 mg Oral Daily   furosemide  40 mg Intravenous Daily   gabapentin  800 mg Oral TID   guaiFENesin  1,200 mg Oral BID   insulin aspart  0-20 Units Subcutaneous TID WC   insulin glargine-yfgn  23 Units Subcutaneous QHS   Ipratropium-Albuterol  1 puff Inhalation Q6H   losartan  12.5 mg Oral Daily   metoprolol succinate  12.5 mg Oral Daily   montelukast  10 mg Oral QHS   pantoprazole  40 mg Oral Daily   predniSONE  40 mg Oral Q breakfast   venlafaxine XR  150 mg Oral Daily   Continuous Infusions:  cefTRIAXone (ROCEPHIN)  IV 2 g (07/16/22 1258)     Assessment & Plan:   Acute on chronic hypoxic respiratory failure complicated by RSV infection Patient states she is much improved since yesterday, now able to speak in full sentences. Continue prednisone 40 mg daily Encourage MDI use every 6 hours Procalcitonin is negative but started on Augmentin for gold criteria however she had GI distress so this was changed  to IV ceftriaxone.  Patient has intolerance to azithromycin for moxifloxacin. Continue montelukast  HFrEF Patient was noted to have bilateral crackles yesterday, p.o. Lasix changed to IV yesterday Today patient does have coarse breath sounds however I do not hear any rales per se Wheezes have also resolved Follow lung exam closely  Anxiety and depression Significant losses/adverse events in the past 6 months Patient with significant stressors with recent loss of home and separation from her son with intellectual disabilities whom she has cared for all his life.  Patient needs help finding new accommodation as she is presently living with her daughter and 4 grandchildren in a 3 bedroom home, she is living in the living room. I have reached out to [facility] [phone] and to [facility] [phone] and left her name and number hopefully they can call her with a volunteer who might be able to assist her. Continue buspirone and venlafaxine  PAF Patient continues on Toprol XL 12.5 daily Eliquis for stroke prevention  DM 2 Blood sugars still elevated on increased glargine to 23 units, up from 20 units However patient did have blood sugar of 129 earlier, will not make any further changes to insulin regimen Continue SSI  HTN Blood pressure is normal on Toprol-XL and losartan   DVT prophylaxis: Eliquis Code Status: Full Family Communication: none today Disposition Plan:   Patient is from:  home  Anticipated Discharge Location:  home  Barriers to Discharge:  ongoing respiratory compromise   Studies: DG Chest Port 1 View  Result Date: 07/15/2022 CLINICAL DATA:  Pneumonia. EXAM: PORTABLE CHEST 1 VIEW COMPARISON:  July 13, 2022. FINDINGS: The heart size and mediastinal contours are within normal limits. No acute pulmonary abnormality is noted. The visualized skeletal structures are unremarkable. IMPRESSION: No active disease. Electronically Signed    By: Marijo Conception M.D.   On: 07/15/2022 11:35    Principal Problem:   Acute respiratory failure with hypoxia (HCC) Active Problems:   COPD with acute exacerbation (HCC)   Paroxysmal atrial fibrillation (HCC)   Insulin dependent type 2 diabetes mellitus ([place])   Chronic kidney disease, stage III (moderate) ([place])   Hypertension associated with type 2 diabetes mellitus ([place])   Chronic HFrEF (heart failure with reduced ejection fraction) ([place])     Shawntell Dixson Tublu Osiah Haring, [facility]  If 7PM-7AM, please contact night-coverage [email or web address]   LOS: 2 days

## 2022-07-16 NOTE — Inpatient Diabetes Management (Signed)
Inpatient Diabetes Program Recommendations  AACE/ADA: New Consensus Statement on Inpatient Glycemic Control (2015)  Target Ranges:  Prepandial:   less than 140 mg/dL      Peak postprandial:   less than 180 mg/dL (1-2 hours)      Critically ill patients:  140 - 180 mg/dL   Lab Results  Component Value Date   GLUCAP 241 (H) 07/16/2022   HGBA1C 7.6 (H) 06/24/2022    Review of Glycemic Control  Latest Reference Range & Units 07/15/22 08:27 07/15/22 11:51 07/15/22 16:31 07/15/22 21:22 07/16/22 07:54  Glucose-Capillary 70 - 99 mg/dL 169 (H) 203 (H) 295 (H) 228 (H) 241 (H)  (H): Data is abnormally high  Diabetes history: DM Outpatient Diabetes medications:  Tresiba 50 units QHS Metformin 1 gm BID Trulicity 4.5 mg weekly Current orders for Inpatient glycemic control:  Semglee 23 units QHS Novolog 0-20 units TID Prednisone 40 mg QAM  Inpatient Diabetes Program Recommendations:    Please consider:  Novolog 2 units TID with meals if consumes at least 50% Semglee 25 units QHS  Will continue to follow while inpatient.  Thank you, Reche Dixon, MSN, [place] Diabetes Coordinator Inpatient Diabetes Program [phone] (team pager from 8a-5p)

## 2022-07-17 LAB — GLUCOSE, CAPILLARY
Glucose-Capillary: 155 mg/dL — ABNORMAL HIGH (ref 70–99)
Glucose-Capillary: 255 mg/dL — ABNORMAL HIGH (ref 70–99)
Glucose-Capillary: 255 mg/dL — ABNORMAL HIGH (ref 70–99)
Glucose-Capillary: 372 mg/dL — ABNORMAL HIGH (ref 70–99)
Glucose-Capillary: 289 mg/dL — ABNORMAL HIGH (ref 70–99)

## 2022-07-17 MED ORDER — PREDNISONE 20 MG PO TABS
40.0000 mg | ORAL_TABLET | Freq: Every day | ORAL | Status: DC
  Administered 2022-07-18: 40 mg via ORAL
  Filled 2022-07-17: qty 2

## 2022-07-17 MED ORDER — INSULIN ASPART 100 UNIT/ML IJ SOLN
2.0000 [IU] | Freq: Three times a day (TID) | INTRAMUSCULAR | Status: DC
  Administered 2022-07-17 – 2022-07-18 (×3): 2 [IU] via SUBCUTANEOUS
  Filled 2022-07-17 (×3): qty 1

## 2022-07-17 NOTE — Progress Notes (Signed)
PROGRESS NOTE    PRAKRITI CARIGNAN  POE:[id number] DOB: 12-Nov-1950 DOA: 07/13/2022 PCP: [name], NP    Brief Narrative:  71 year old with history of COPD, on nocturnal oxygen due to intolerance to CPAP or BiPAP, chronic atrial fibrillation, heart failure with reduced ejection fraction, type 2 diabetes on insulin and hypertension admitted with shortness of breath and wheezing and found to have COPD exacerbation with RSV infection.  She has persistent symptoms and also has underlying anxiety.   Assessment & Plan:   COPD with acute exacerbation: Patient with advanced COPD and chronic hypoxemia. Continues to need hospital care because of severity of symptoms. On oral steroids, will extend prednisone therapy.  Continue with inhalational steroids, scheduled and as needed bronchodilators, deep breathing exercises, incentive spirometry, chest physiotherapy.  antibiotics due to severity of symptoms.  Rocephin day 5/5. Supplemental oxygen to keep saturations more than 92%. Mobilize in the hallway with physical therapy.  Chronic diastolic heart failure: On IV diuretics.  Clinically dehydrated.  Diuretic holiday, resume tomorrow if stable.  Paroxysmal atrial fibrillation: Currently rate controlled on Toprol-XL.  Therapeutic on Eliquis.  Type 2 diabetes, uncontrolled with hyperglycemia: Worsened with a steroid use.  Keep on long-acting insulin.  Adding prandial insulin today.  Continue sliding scale insulin.  Essential hypertension: Blood pressure stable on Toprol-XL and losartan.  Anxiety/stressors: Patient is on BuSpar, venlafaxine that is continued.  DVT prophylaxis:  apixaban (ELIQUIS) tablet 5 mg   Code Status: Full code Family Communication: None at the bedside Disposition Plan: Status is: Inpatient Remains inpatient appropriate because: Persistent wheezing needing monitoring, treatment in the hospital     Consultants:  None  Procedures:  None  Antimicrobials:   Rocephin day 5   Subjective: Patient seen in the morning rounds.  She does have some cough and intermittent episodes of wheezing.  Looks anxious.  Her symptoms probably aggravated with thought of going home.  Objective: Vitals:   07/16/22 1709 07/17/22 0100 07/17/22 0133 07/17/22 1100  BP: 119/74 (!) 101/54 109/60 114/67  Pulse: 95 82 84 93  Resp: '18 18 18   '$ Temp: 97.7 F (36.5 C) 98.2 F (36.8 C) 98.1 F (36.7 C)   TempSrc:      SpO2: 98% (!) 89% 98%   Weight:      Height:        Intake/Output Summary (Last 24 hours) at 07/17/2022 1322 Last data filed at 07/16/2022 1547 Gross per 24 hour  Intake --  Output 1000 ml  Net -1000 ml   Filed Weights   07/13/22 0938  Weight: 68 kg    Examination:  General exam: Appears fairly comfortable however occasionally with nagging cough. Respiratory system: Bilateral good air entry.  Respiratory effort normal.  On 2 L oxygen.  Scattered expiratory wheezes and upper airway sounds. Cardiovascular system: S1 & S2 heard, RRR. No JVD, murmurs, rubs, gallops or clicks.  Gastrointestinal system: Abdomen is nondistended, soft and nontender. No organomegaly or masses felt. Normal bowel sounds heard. Central nervous system: Alert and oriented. No focal neurological deficits.  Anxious. Extremities: Symmetric 5 x 5 power.    Data Reviewed: I have personally reviewed following labs and imaging studies  CBC: Recent Labs  Lab 07/13/22 0945 07/14/22 0645 07/15/22 0241 07/16/22 0243  WBC 11.3* 10.9* 13.8* 15.4*  NEUTROABS  --  7.6  --   --   HGB 12.3 10.7* 11.3* 12.1  HCT 38.9 33.4* 35.2* 37.9  MCV 96.5 93.8 95.1 94.5  PLT 384 373 384  314*   Basic Metabolic Panel: Recent Labs  Lab 07/13/22 0945 07/14/22 0645 07/16/22 0243  NA 135 136 134*  K 4.4 3.9 3.8  CL 100 99 96*  CO2 '27 29 28  '$ GLUCOSE 204* 202* 252*  BUN 13 14 28*  CREATININE 1.12* 1.02* 1.29*  CALCIUM 9.1 8.5* 8.7*   GFR: Estimated Creatinine Clearance: 32.2  mL/min (A) (by C-G formula based on SCr of 1.29 mg/dL (H)). Liver Function Tests: No results for input(s): "AST", "ALT", "ALKPHOS", "BILITOT", "PROT", "ALBUMIN" in the last 168 hours. No results for input(s): "LIPASE", "AMYLASE" in the last 168 hours. No results for input(s): "AMMONIA" in the last 168 hours. Coagulation Profile: No results for input(s): "INR", "PROTIME" in the last 168 hours. Cardiac Enzymes: No results for input(s): "CKTOTAL", "CKMB", "CKMBINDEX", "TROPONINI" in the last 168 hours. BNP (last 3 results) No results for input(s): "PROBNP" in the last 8760 hours. HbA1C: No results for input(s): "HGBA1C" in the last 72 hours. CBG: Recent Labs  Lab 07/16/22 1705 07/16/22 1759 07/16/22 2136 07/17/22 0746 07/17/22 1205  GLUCAP 367* 400* 229* 155* 255*  255*   Lipid Profile: No results for input(s): "CHOL", "HDL", "LDLCALC", "TRIG", "CHOLHDL", "LDLDIRECT" in the last 72 hours. Thyroid Function Tests: No results for input(s): "TSH", "T4TOTAL", "FREET4", "T3FREE", "THYROIDAB" in the last 72 hours. Anemia Panel: No results for input(s): "VITAMINB12", "FOLATE", "FERRITIN", "TIBC", "IRON", "RETICCTPCT" in the last 72 hours. Sepsis Labs: Recent Labs  Lab 07/13/22 1355  PROCALCITON <0.10    Recent Results (from the past 240 hour(s))  Resp panel by RT-PCR (RSV, Flu A&B, Covid) Anterior Nasal Swab     Status: Abnormal   Collection Time: 07/13/22 11:00 AM   Specimen: Anterior Nasal Swab  Result Value Ref Range Status   SARS Coronavirus 2 by RT PCR NEGATIVE NEGATIVE Final    Comment: (NOTE) SARS-CoV-2 target nucleic acids are NOT DETECTED.  The SARS-CoV-2 RNA is generally detectable in upper respiratory specimens during the acute phase of infection. The lowest concentration of SARS-CoV-2 viral copies this assay can detect is 138 copies/mL. A negative result does not preclude SARS-Cov-2 infection and should not be used as the sole basis for treatment or other patient  management decisions. A negative result may occur with  improper specimen collection/handling, submission of specimen other than nasopharyngeal swab, presence of viral mutation(s) within the areas targeted by this assay, and inadequate number of viral copies(<138 copies/mL). A negative result must be combined with clinical observations, patient history, and epidemiological information. The expected result is Negative.  Fact Sheet for Patients:  EntrepreneurPulse.com.au  Fact Sheet for Healthcare Providers:  IncredibleEmployment.be  This test is no t yet approved or cleared by the [place] FDA and  has been authorized for detection and/or diagnosis of SARS-CoV-2 by FDA under an Emergency Use Authorization (EUA). This EUA will remain  in effect (meaning this test can be used) for the duration of the COVID-19 declaration under Section 564(b)(1) of the Act, 21 U.S.C.section 360bbb-3(b)(1), unless the authorization is terminated  or revoked sooner.       Influenza A by PCR NEGATIVE NEGATIVE Final   Influenza B by PCR NEGATIVE NEGATIVE Final    Comment: (NOTE) The Xpert Xpress SARS-CoV-2/FLU/RSV plus assay is intended as an aid in the diagnosis of influenza from Nasopharyngeal swab specimens and should not be used as a sole basis for treatment. Nasal washings and aspirates are unacceptable for Xpert Xpress SARS-CoV-2/FLU/RSV testing.  Fact Sheet for Patients: EntrepreneurPulse.com.au  Fact  Sheet for Healthcare Providers: IncredibleEmployment.be  This test is not yet approved or cleared by the [place] and has been authorized for detection and/or diagnosis of SARS-CoV-2 by FDA under an Emergency Use Authorization (EUA). This EUA will remain in effect (meaning this test can be used) for the duration of the COVID-19 declaration under Section 564(b)(1) of the Act, 21 U.S.C. section 360bbb-3(b)(1),  unless the authorization is terminated or revoked.     Resp Syncytial Virus by PCR POSITIVE (A) NEGATIVE Final    Comment: (NOTE) Fact Sheet for Patients: EntrepreneurPulse.com.au  Fact Sheet for Healthcare Providers: IncredibleEmployment.be  This test is not yet approved or cleared by the [place] FDA and has been authorized for detection and/or diagnosis of SARS-CoV-2 by FDA under an Emergency Use Authorization (EUA). This EUA will remain in effect (meaning this test can be used) for the duration of the COVID-19 declaration under Section 564(b)(1) of the Act, 21 U.S.C. section 360bbb-3(b)(1), unless the authorization is terminated or revoked.  Performed at [facility], [address], [place],  [address]          Radiology Studies: No results found.      Scheduled Meds:  apixaban  5 mg Oral BID   atorvastatin  40 mg Oral Daily   busPIRone  5 mg Oral BID   fluticasone furoate-vilanterol  1 puff Inhalation Daily   And   umeclidinium bromide  1 puff Inhalation Daily   folic acid  1 mg Oral Daily   gabapentin  800 mg Oral TID   guaiFENesin  1,200 mg Oral BID   insulin aspart  0-20 Units Subcutaneous TID WC   insulin aspart  2 Units Subcutaneous TID WC   insulin glargine-yfgn  23 Units Subcutaneous QHS   Ipratropium-Albuterol  1 puff Inhalation Q6H   losartan  12.5 mg Oral Daily   metoprolol succinate  12.5 mg Oral Daily   montelukast  10 mg Oral QHS   pantoprazole  40 mg Oral Daily   [START ON 07/18/2022] predniSONE  40 mg Oral Q breakfast   venlafaxine XR  150 mg Oral Daily   Continuous Infusions:     LOS: 3 days    Time spent: 35 minutes    Barb Merino, MD [facility] Pager [phone]

## 2022-07-17 NOTE — Plan of Care (Signed)
  Problem: Activity: Goal: Risk for activity intolerance will decrease Outcome: Progressing   Problem: Pain Managment: Goal: General experience of comfort will improve Outcome: Progressing   Problem: Safety: Goal: Ability to remain free from injury will improve Outcome: Progressing   

## 2022-07-17 NOTE — Inpatient Diabetes Management (Signed)
Inpatient Diabetes Program Recommendations  AACE/ADA: New Consensus Statement on Inpatient Glycemic Control (2015)  Target Ranges:  Prepandial:   less than 140 mg/dL      Peak postprandial:   less than 180 mg/dL (1-2 hours)      Critically ill patients:  140 - 180 mg/dL   Lab Results  Component Value Date   GLUCAP 155 (H) 07/17/2022   HGBA1C 7.6 (H) 06/24/2022    Review of Glycemic Control  Latest Reference Range & Units 07/16/22 07:54 07/16/22 11:54 07/16/22 17:05 07/16/22 17:59 07/16/22 21:36 07/17/22 07:46  Glucose-Capillary 70 - 99 mg/dL 241 (H) 129 (H) 367 (H) 400 (H) 229 (H) 155 (H)  (H): Data is abnormally high  Diabetes history: DM Outpatient Diabetes medications:  Tresiba 50 units QHS Metformin 1 gm BID Trulicity 4.5 mg weekly Current orders for Inpatient glycemic control:  Semglee 23 units QHS Novolog 0-20 units TID Prednisone 40 mg QAM   Inpatient Diabetes Program Recommendations:     Please consider:   Novolog 2 units TID with meals if consumes at least 50%   Will continue to follow while inpatient.   Thank you, Reche Dixon, MSN, [place] Diabetes Coordinator Inpatient Diabetes Program [phone] (team pager from 8a-5p)

## 2022-07-18 DIAGNOSIS — J9601 Acute respiratory failure with hypoxia: Secondary | ICD-10-CM | POA: Diagnosis not present

## 2022-07-18 LAB — GLUCOSE, CAPILLARY
Glucose-Capillary: 192 mg/dL — ABNORMAL HIGH (ref 70–99)
Glucose-Capillary: 252 mg/dL — ABNORMAL HIGH (ref 70–99)

## 2022-07-18 MED ORDER — GUAIFENESIN ER 600 MG PO TB12: 1200.0000 mg | ORAL_TABLET | Freq: Two times a day (BID) | ORAL | 0 refills | Status: AC

## 2022-07-18 MED ORDER — ALBUTEROL SULFATE (2.5 MG/3ML) 0.083% IN NEBU: 2.5000 mg | INHALATION_SOLUTION | RESPIRATORY_TRACT | 2 refills | Status: DC | PRN

## 2022-07-18 MED ORDER — HYDROCOD POLI-CHLORPHE POLI ER 10-8 MG/5ML PO SUER: 5.0000 mL | Freq: Two times a day (BID) | ORAL | 0 refills | Status: DC | PRN

## 2022-07-18 MED ORDER — PREDNISONE 10 MG PO TABS: ORAL_TABLET | ORAL | 0 refills | Status: DC

## 2022-07-18 NOTE — Plan of Care (Signed)

## 2022-07-18 NOTE — Discharge Summary (Signed)
Physician Discharge Summary  Jordan Chan ZOX:[id number] DOB: Jul 14, 1951 DOA: 07/13/2022  PCP: [name], NP  Admit date: 07/13/2022 Discharge date: 07/18/2022  Admitted From: Home Disposition: Home  Recommendations for Outpatient Follow-up:  Follow up with PCP in 1-2 weeks Schedule follow-up with pulmonary.  Home Health: N/A Equipment/Devices: Nebulizer machine  Discharge Condition: Fair CODE STATUS: Full code Diet recommendation: Low-salt diet  Discharge summary: 71 year old with history of COPD on nocturnal oxygen due to intolerance to CPAP or BiPAP, chronic atrial fibrillation, heart failure with reduced ejection fraction, type 2 diabetes on insulin and hypertension admitted with shortness of breath and wheezing and found to have COPD exacerbation with RSV infection.  She has persistent symptoms and also has underlying anxiety.  Recently with a lot of stressors in life.  She is also on chronic pain management with intrathecal pump.  Patient remained in the hospital due to ongoing wheezing and cough.  Clinically improved now.  COPD with acute exacerbation due to RSV: Patient with advanced COPD and chronic hypoxemia. On oral steroids, will extend prednisone therapy for additional 6 days of tapering. Clinically improving. Patient is on trelegy Ellipta at home.  She is also on Singulair.  Patient is on albuterol inhaler, will also prescribe albuterol nebulizer.  She will continue to do chest physiotherapy and exercise at home.  She will use oxygen for comfort and to keep saturation more than 90%.  Patient was treated with antibiotics, she completed 5 days of Rocephin therapy.  Mobilize in the hallway without distress.  Patient does have a pulmonologist as outpatient.  She will schedule follow-up. Patient was additionally prescribed a short course of codeine-containing cough syrup.   Chronic diastolic heart failure: Euvolemic.  Resume home dose of Lasix.   Paroxysmal  atrial fibrillation: Currently rate controlled on Toprol-XL.  Therapeutic on Eliquis.   Type 2 diabetes, uncontrolled with hyperglycemia: Worsened with steroid use.  Quickly taper off steroids.  She will resume her home dose of insulin, Trulicity and metformin.   Essential hypertension: Blood pressure stable on Toprol-XL and losartan.   Anxiety/stressors/chronic pain syndrome: Patient is on BuSpar, venlafaxine that is continued.  She has a morphine pump that she will continue.  Patient suffers from significant anxiety related to her housing condition.  She is currently living with her daughter.  She was provided different resources for housing options and she will work on it with the help of her daughter.  She is medically stable but underlying diagnosis and stressor she is high risk of readmission.    Discharge Diagnoses:  Principal Problem:   Acute respiratory failure with hypoxia (HCC) Active Problems:   COPD with acute exacerbation (HCC)   Paroxysmal atrial fibrillation (HCC)   Insulin dependent type 2 diabetes mellitus (HCC)   Chronic kidney disease, stage III (moderate) (HCC)   Hypertension associated with type 2 diabetes mellitus (HCC)   Chronic HFrEF (heart failure with reduced ejection fraction) [facility])    Discharge Instructions  Discharge Instructions     Diet - low sodium heart healthy   Complete by: As directed    For home use only DME Nebulizer machine   Complete by: As directed    Patient needs a nebulizer to treat with the following condition: COPD with acute exacerbation ([place])   Length of Need: Lifetime   Increase activity slowly   Complete by: As directed       Allergies as of 07/18/2022       Reactions   Other Palpitations  IV steroids   Pain Patch [menthol] Anaphylaxis   Avelox [moxifloxacin Hcl In Nacl] Other (See Comments)   Upset stomach   Doxycycline Diarrhea   Erythromycin Nausea Only, Other (See Comments)   Can take a Z-Pak just fine Other  reaction(s): Other (See Comments) Can take Z-Pak Can take a Z-Pak just fine   Fentanyl Nausea Only, Rash   Moxifloxacin Hcl Other (See Comments)   Upset stomach Upset stomach   Oxycontin [oxycodone] Hives   Ozempic [semaglutide] Nausea Only        Medication List     TAKE these medications    Accu-Chek Aviva Plus test strip Generic drug: glucose blood TEST THREE TIMES DAILY   Accu-Chek Softclix Lancets lancets TEST BLOOD SUGAR THREE TIMES DAILY   albuterol 108 (90 Base) MCG/ACT inhaler Commonly known as: VENTOLIN HFA Inhale 2 puffs into the lungs every 6 (six) hours as needed for wheezing or shortness of breath. What changed: Another medication with the same name was added. Make sure you understand how and when to take each.   albuterol (2.5 MG/3ML) 0.083% nebulizer solution Commonly known as: PROVENTIL Take 3 mLs (2.5 mg total) by nebulization every 4 (four) hours as needed for wheezing or shortness of breath. What changed: You were already taking a medication with the same name, and this prescription was added. Make sure you understand how and when to take each.   apixaban 5 MG Tabs tablet Commonly known as: ELIQUIS Take 1 tablet (5 mg total) by mouth 2 (two) times daily.   atorvastatin 40 MG tablet Commonly known as: LIPITOR Take 1 tablet (40 mg total) by mouth daily.   busPIRone 5 MG tablet Commonly known as: BUSPAR Take 1 tablet (5 mg total) by mouth 2 (two) times daily.   chlorpheniramine-HYDROcodone 10-8 MG/5ML Commonly known as: TUSSIONEX Take 5 mLs by mouth every 12 (twelve) hours as needed for cough.   cholecalciferol 25 MCG (1000 UNIT) tablet Commonly known as: VITAMIN D3 Take 1,000 Units by mouth daily.   Cyanocobalamin 1000 MCG/ML Kit Inject 1,000 mcg as directed every 30 (thirty) days.   cyclobenzaprine 5 MG tablet Commonly known as: FLEXERIL Take 5 mg by mouth 2 (two) times daily as needed for muscle spasms. Takes very rarely   diclofenac  50 MG EC tablet Commonly known as: VOLTAREN TAKE 1 TABLET THREE TIMES DAILY   DropSafe Alcohol Prep 70 % Pads USE TWICE DAILY  WITH  SUGAR  CHECKS   folic acid 1 MG tablet Commonly known as: FOLVITE TAKE 1 TABLET EVERY DAY   furosemide 40 MG tablet Commonly known as: LASIX Take 1 tablet (40 mg total) by mouth daily.   gabapentin 800 MG tablet Commonly known as: NEURONTIN Take 1 tablet (800 mg total) by mouth 3 (three) times daily.   guaiFENesin 600 MG 12 hr tablet Commonly known as: MUCINEX Take 2 tablets (1,200 mg total) by mouth 2 (two) times daily for 14 days.   losartan 25 MG tablet Commonly known as: COZAAR Take 0.5 tablets (12.5 mg total) by mouth daily.   metFORMIN 1000 MG tablet Commonly known as: GLUCOPHAGE TAKE 1 TABLET TWICE DAILY WITH MEALS   metoprolol succinate 25 MG 24 hr tablet Commonly known as: TOPROL-XL Take 0.5 tablets (12.5 mg total) by mouth daily.   montelukast 10 MG tablet Commonly known as: SINGULAIR Take 1 tablet (10 mg total) by mouth at bedtime.   ondansetron 4 MG tablet Commonly known as: ZOFRAN Take 1 tablet (4 mg  total) by mouth every 8 (eight) hours as needed for nausea or vomiting.   PAIN MANAGEMENT INTRATHECAL (IT) PUMP 1 each by Intrathecal route. Intrathecal (IT) medication:  Morphine Patient does not remember current. Adjusted every 2.5 months at [facility] in [place].   pantoprazole 40 MG tablet Commonly known as: PROTONIX Take 1 tablet (40 mg total) by mouth daily.   predniSONE 10 MG tablet Commonly known as: DELTASONE 3 tabs daily for 2 days 2 tabs daily for 2 days 1 tab daily for 2 days   Trelegy Ellipta 100-62.5-25 MCG/ACT Aepb Generic drug: Fluticasone-Umeclidin-Vilant INHALE 1 PUFF INTO THE LUNGS DAILY   Tresiba FlexTouch 100 UNIT/ML FlexTouch Pen Generic drug: insulin degludec Inject 50 Units into the skin at bedtime.   True Metrix Meter w/Device Kit Use to check blood sugar 4 times a  day   Trulicity 4.5 GY/6.9SW Sopn Generic drug: Dulaglutide Inject 4.5 mg as directed once a week.   venlafaxine XR 150 MG 24 hr capsule Commonly known as: EFFEXOR-XR Take 1 capsule (150 mg total) by mouth daily.               Durable Medical Equipment  (From admission, onward)           Start     Ordered   07/18/22 0000  For home use only DME Nebulizer machine       Question Answer Comment  Patient needs a nebulizer to treat with the following condition COPD with acute exacerbation ([place])   Length of Need Lifetime      07/18/22 1027            Allergies  Allergen Reactions   Other Palpitations    IV steroids   Pain Patch [Menthol] Anaphylaxis   Avelox [Moxifloxacin Hcl In Nacl] Other (See Comments)    Upset stomach   Doxycycline Diarrhea   Erythromycin Nausea Only and Other (See Comments)    Can take a Z-Pak just fine Other reaction(s): Other (See Comments) Can take Z-Pak  Can take a Z-Pak just fine   Fentanyl Nausea Only and Rash   Moxifloxacin Hcl Other (See Comments)    Upset stomach Upset stomach   Oxycontin [Oxycodone] Hives   Ozempic [Semaglutide] Nausea Only    Consultations: None   Procedures/Studies: DG Chest Port 1 View  Result Date: 07/15/2022 CLINICAL DATA:  Pneumonia. EXAM: PORTABLE CHEST 1 VIEW COMPARISON:  July 13, 2022. FINDINGS: The heart size and mediastinal contours are within normal limits. No acute pulmonary abnormality is noted. The visualized skeletal structures are unremarkable. IMPRESSION: No active disease. Electronically Signed   By: Marijo Conception M.D.   On: 07/15/2022 11:35   DG Chest 2 View  Result Date: 07/13/2022 CLINICAL DATA:  Shortness of breath. EXAM: CHEST - 2 VIEW COMPARISON:  05/07/2022 FINDINGS: The lungs are clear without focal pneumonia, edema, pneumothorax or pleural effusion. Interstitial markings are diffusely coarsened with chronic features. Calcified granuloma right apex is stable. The  cardiopericardial silhouette is within normal limits for size. Bones are diffusely demineralized. IMPRESSION: Chronic interstitial coarsening without acute cardiopulmonary findings. Electronically Signed   By: Misty Stanley M.D.   On: 07/13/2022 10:00   (Echo, Carotid, EGD, Colonoscopy, ERCP)    Subjective: Patient was seen and examined.  No overnight events.  Tearful due to her situation otherwise denies any shortness of breath.  She does occasionally gets nagging cough.  Afebrile.  She was able to manage on 2 L oxygen.  She  was able to even go to the bathroom without oxygen.   Discharge Exam: Vitals:   07/18/22 0006 07/18/22 0824  BP: (!) 141/70 121/63  Pulse: 98 84  Resp: 18 17  Temp: 97.7 F (36.5 C) 97.7 F (36.5 C)  SpO2: 98% 98%   Vitals:   07/17/22 1100 07/17/22 1626 07/18/22 0006 07/18/22 0824  BP: 114/67 127/65 (!) 141/70 121/63  Pulse: 93 95 98 84  Resp:  _0 Temp:  97.7 F (36.5 C) 97.7 F (36.5 C) 97.7 F (36.5 C)  TempSrc:      SpO2:  97% 98% 98%  Weight:      Height:        General: Pt is alert, awake, not in acute distress Anxious and tearful due to her circumstances.  Denies any hallucinations or delusions.  Denies any suicidal or homicidal ideations. Cardiovascular: RRR, S1/S2 +, no rubs, no gallops Respiratory: CTA bilaterally, no wheezing, no rhonchi, mostly bronchial breath sounds.  Not in any distress.  Not using any accessory muscles.  On 2 L oxygen and saturating 98%. Abdominal: Soft, NT, ND, bowel sounds + Extremities: no edema, no cyanosis    The results of significant diagnostics from this hospitalization (including imaging, microbiology, ancillary and laboratory) are listed below for reference.     Microbiology: Recent Results (from the past 240 hour(s))  Resp panel by RT-PCR (RSV, Flu A&B, Covid) Anterior Nasal Swab     Status: Abnormal   Collection Time: 07/13/22 11:00 AM   Specimen: Anterior Nasal Swab  Result Value Ref Range  Status   SARS Coronavirus 2 by RT PCR NEGATIVE NEGATIVE Final    Comment: (NOTE) SARS-CoV-2 target nucleic acids are NOT DETECTED.  The SARS-CoV-2 RNA is generally detectable in upper respiratory specimens during the acute phase of infection. The lowest concentration of SARS-CoV-2 viral copies this assay can detect is 138 copies/mL. A negative result does not preclude SARS-Cov-2 infection and should not be used as the sole basis for treatment or other patient management decisions. A negative result may occur with  improper specimen collection/handling, submission of specimen other than nasopharyngeal swab, presence of viral mutation(s) within the areas targeted by this assay, and inadequate number of viral copies(<138 copies/mL). A negative result must be combined with clinical observations, patient history, and epidemiological information. The expected result is Negative.  Fact Sheet for Patients:  EntrepreneurPulse.com.au  Fact Sheet for Healthcare Providers:  IncredibleEmployment.be  This test is no t yet approved or cleared by the [place] FDA and  has been authorized for detection and/or diagnosis of SARS-CoV-2 by FDA under an Emergency Use Authorization (EUA). This EUA will remain  in effect (meaning this test can be used) for the duration of the COVID-19 declaration under Section 564(b)(1) of the Act, 21 U.S.C.section 360bbb-3(b)(1), unless the authorization is terminated  or revoked sooner.       Influenza A by PCR NEGATIVE NEGATIVE Final   Influenza B by PCR NEGATIVE NEGATIVE Final    Comment: (NOTE) The Xpert Xpress SARS-CoV-2/FLU/RSV plus assay is intended as an aid in the diagnosis of influenza from Nasopharyngeal swab specimens and should not be used as a sole basis for treatment. Nasal washings and aspirates are unacceptable for Xpert Xpress SARS-CoV-2/FLU/RSV testing.  Fact Sheet for  Patients: EntrepreneurPulse.com.au  Fact Sheet for Healthcare Providers: IncredibleEmployment.be  This test is not yet approved or cleared by the [place] FDA and has been authorized for detection and/or diagnosis of SARS-CoV-2 by  FDA under an Emergency Use Authorization (EUA). This EUA will remain in effect (meaning this test can be used) for the duration of the COVID-19 declaration under Section 564(b)(1) of the Act, 21 U.S.C. section 360bbb-3(b)(1), unless the authorization is terminated or revoked.     Resp Syncytial Virus by PCR POSITIVE (A) NEGATIVE Final    Comment: (NOTE) Fact Sheet for Patients: EntrepreneurPulse.com.au  Fact Sheet for Healthcare Providers: IncredibleEmployment.be  This test is not yet approved or cleared by the [place] FDA and has been authorized for detection and/or diagnosis of SARS-CoV-2 by FDA under an Emergency Use Authorization (EUA). This EUA will remain in effect (meaning this test can be used) for the duration of the COVID-19 declaration under Section 564(b)(1) of the Act, 21 U.S.C. section 360bbb-3(b)(1), unless the authorization is terminated or revoked.  Performed at [facility], [address] [place], [place] [address]      Labs: BNP (last 3 results) Recent Labs    09/05/21 1114 09/11/21 0933  BNP 29.7 563.1*   Basic Metabolic Panel: Recent Labs  Lab 07/13/22 0945 07/14/22 0645 07/16/22 0243  NA 135 136 134*  K 4.4 3.9 3.8  CL 100 99 96*  CO2 _0 GLUCOSE 204* 202* 252*  BUN 13 14 28*  CREATININE 1.12* 1.02* 1.29*  CALCIUM 9.1 8.5* 8.7*   Liver Function Tests: No results for input(s): "AST", "ALT", "ALKPHOS", "BILITOT", "PROT", "ALBUMIN" in the last 168 hours. No results for input(s): "LIPASE", "AMYLASE" in the last 168 hours. No results for input(s): "AMMONIA" in the last 168 hours. CBC: Recent Labs  Lab  07/13/22 0945 07/14/22 0645 07/15/22 0241 07/16/22 0243  WBC 11.3* 10.9* 13.8* 15.4*  NEUTROABS  --  7.6  --   --   HGB 12.3 10.7* 11.3* 12.1  HCT 38.9 33.4* 35.2* 37.9  MCV 96.5 93.8 95.1 94.5  PLT 384 373 384 457*   Cardiac Enzymes: No results for input(s): "CKTOTAL", "CKMB", "CKMBINDEX", "TROPONINI" in the last 168 hours. BNP: Invalid input(s): "POCBNP" CBG: Recent Labs  Lab 07/17/22 0746 07/17/22 1205 07/17/22 1624 07/17/22 2110 07/18/22 0828  GLUCAP 155* 255*  255* 372* 289* 192*   D-Dimer No results for input(s): "DDIMER" in the last 72 hours. Hgb A1c No results for input(s): "HGBA1C" in the last 72 hours. Lipid Profile No results for input(s): "CHOL", "HDL", "LDLCALC", "TRIG", "CHOLHDL", "LDLDIRECT" in the last 72 hours. Thyroid function studies No results for input(s): "TSH", "T4TOTAL", "T3FREE", "THYROIDAB" in the last 72 hours.  Invalid input(s): "FREET3" Anemia work up No results for input(s): "VITAMINB12", "FOLATE", "FERRITIN", "TIBC", "IRON", "RETICCTPCT" in the last 72 hours. Urinalysis    Component Value Date/Time   COLORURINE STRAW (A) 09/11/2021 1415   APPEARANCEUR CLEAR (A) 09/11/2021 1415   APPEARANCEUR Cloudy (A) 06/26/2021 1540   LABSPEC 1.032 (H) 09/11/2021 1415   PHURINE 5.0 09/11/2021 1415   GLUCOSEU NEGATIVE 09/11/2021 1415   HGBUR NEGATIVE 09/11/2021 1415   BILIRUBINUR NEGATIVE 09/11/2021 1415   BILIRUBINUR Negative 08/01/2019 1038   KETONESUR NEGATIVE 09/11/2021 1415   PROTEINUR NEGATIVE 09/11/2021 1415   NITRITE NEGATIVE 09/11/2021 1415   LEUKOCYTESUR NEGATIVE 09/11/2021 1415   Sepsis Labs Recent Labs  Lab 07/13/22 0945 07/14/22 0645 07/15/22 0241 07/16/22 0243  WBC 11.3* 10.9* 13.8* 15.4*   Microbiology Recent Results (from the past 240 hour(s))  Resp panel by RT-PCR (RSV, Flu A&B, Covid) Anterior Nasal Swab     Status: Abnormal   Collection Time: 07/13/22 11:00 AM  Specimen: Anterior Nasal Swab  Result Value Ref  Range Status   SARS Coronavirus 2 by RT PCR NEGATIVE NEGATIVE Final    Comment: (NOTE) SARS-CoV-2 target nucleic acids are NOT DETECTED.  The SARS-CoV-2 RNA is generally detectable in upper respiratory specimens during the acute phase of infection. The lowest concentration of SARS-CoV-2 viral copies this assay can detect is 138 copies/mL. A negative result does not preclude SARS-Cov-2 infection and should not be used as the sole basis for treatment or other patient management decisions. A negative result may occur with  improper specimen collection/handling, submission of specimen other than nasopharyngeal swab, presence of viral mutation(s) within the areas targeted by this assay, and inadequate number of viral copies(<138 copies/mL). A negative result must be combined with clinical observations, patient history, and epidemiological information. The expected result is Negative.  Fact Sheet for Patients:  EntrepreneurPulse.com.au  Fact Sheet for Healthcare Providers:  IncredibleEmployment.be  This test is no t yet approved or cleared by the [place] FDA and  has been authorized for detection and/or diagnosis of SARS-CoV-2 by FDA under an Emergency Use Authorization (EUA). This EUA will remain  in effect (meaning this test can be used) for the duration of the COVID-19 declaration under Section 564(b)(1) of the Act, 21 U.S.C.section 360bbb-3(b)(1), unless the authorization is terminated  or revoked sooner.       Influenza A by PCR NEGATIVE NEGATIVE Final   Influenza B by PCR NEGATIVE NEGATIVE Final    Comment: (NOTE) The Xpert Xpress SARS-CoV-2/FLU/RSV plus assay is intended as an aid in the diagnosis of influenza from Nasopharyngeal swab specimens and should not be used as a sole basis for treatment. Nasal washings and aspirates are unacceptable for Xpert Xpress SARS-CoV-2/FLU/RSV testing.  Fact Sheet for  Patients: EntrepreneurPulse.com.au  Fact Sheet for Healthcare Providers: IncredibleEmployment.be  This test is not yet approved or cleared by the [place] FDA and has been authorized for detection and/or diagnosis of SARS-CoV-2 by FDA under an Emergency Use Authorization (EUA). This EUA will remain in effect (meaning this test can be used) for the duration of the COVID-19 declaration under Section 564(b)(1) of the Act, 21 U.S.C. section 360bbb-3(b)(1), unless the authorization is terminated or revoked.     Resp Syncytial Virus by PCR POSITIVE (A) NEGATIVE Final    Comment: (NOTE) Fact Sheet for Patients: EntrepreneurPulse.com.au  Fact Sheet for Healthcare Providers: IncredibleEmployment.be  This test is not yet approved or cleared by the [place] FDA and has been authorized for detection and/or diagnosis of SARS-CoV-2 by FDA under an Emergency Use Authorization (EUA). This EUA will remain in effect (meaning this test can be used) for the duration of the COVID-19 declaration under Section 564(b)(1) of the Act, 21 U.S.C. section 360bbb-3(b)(1), unless the authorization is terminated or revoked.  Performed at [facility], [address] [place], [place] [address]      Time coordinating discharge: 35 minutes  SIGNED:   Barb Merino, MD  [facility] 07/18/2022, 10:27 AM

## 2022-07-18 NOTE — Care Management Important Message (Signed)
Important Message  Patient Details  Name: Jordan Chan MRN: [id number] Date of Birth: [birth year]   Medicare Important Message Given:  Other (see comment)  Attempted to reach patient via room phone [phone]) to review Medicare IM due to isolation status.  Unable to reach upon attempt.   Dannette Barbara 07/18/2022, 2:52 PM

## 2022-07-21 ENCOUNTER — Telehealth: Payer: Self-pay | Admitting: *Deleted

## 2022-07-21 ENCOUNTER — Telehealth: Payer: Self-pay | Admitting: Nurse Practitioner

## 2022-07-21 ENCOUNTER — Encounter: Payer: Self-pay | Admitting: *Deleted

## 2022-07-21 ENCOUNTER — Other Ambulatory Visit: Payer: Self-pay | Admitting: Nurse Practitioner

## 2022-07-21 DIAGNOSIS — J441 Chronic obstructive pulmonary disease with (acute) exacerbation: Secondary | ICD-10-CM

## 2022-07-21 MED ORDER — EASY AIR COMPRESSOR NEBULIZER MISC: 0 refills | Status: AC

## 2022-07-21 NOTE — Telephone Encounter (Signed)
Pt called to report that someone she sent to retrieve this for her was told that they do not have this nebulizer in stock. Pt is frustrated and upset.

## 2022-07-21 NOTE — Addendum Note (Signed)
Addended by: [name] T on: 07/21/2022 04:51 PM   Modules accepted: Orders

## 2022-07-21 NOTE — Telephone Encounter (Signed)
Pt was sent home from the hospital with albuterol (PROVENTIL) (2.5 MG/3ML) 0.083% nebulizer solution / but pt does not have a Nebulizer machine and is in need of one asap / please advise

## 2022-07-21 NOTE — Patient Outreach (Signed)
  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 "I just don't feel good yet.  I am weak and trying to make sure I eat.  I did get all of my medicine and I am taking it all.  I need to get the nebulizer, thank you for letting me know that Jolene has sent in the prescription for that.  I am going to see her on Thursday and my husband will be taking me.  I hope I start feeling better soon." Any questions or concerns? Yes -- did not receive nebulizer as ordered at time of hospital discharge: confirmed through review of EHR that PCP was made aware of this need earlier today and has sent prescription for nebulizer to [facility]-- updated patient accordingly   Items Reviewed: Did the pt receive and understand the discharge instructions provided? Yes  Medications obtained and verified? Yes  Other? No  Any new allergies since your discharge? No  Dietary orders reviewed? Yes Do you have support at home? Yes  currently residing with her daughter; reports family assisting with all care needs as indicated/ needed  Home Care and Equipment/Supplies: Were home health services ordered? no If so, what is the name of the agency? N/A  Has the agency set up a time to come to the patient's home? not applicable Were any new equipment or medical supplies ordered?  Yes: nebulizer What is the name of the medical supply agency? "I have no idea" Were you able to get the supplies/equipment? No-- as above, confirmed the nebulizer prescription was sent in by PCP earlier today Do you have any questions related to the use of the equipment or supplies? No  Functional Questionnaire: (I = Independent and D = Dependent) ADLs: I  family assisting with all care needs as indicated  Bathing/Dressing- I  Meal Prep- I   family assisting with all care needs as indicated  Eating- I  Maintaining continence- I  Transferring/Ambulation- I  Managing Meds- I  Follow up appointments reviewed:  PCP Hospital f/u appt confirmed? Yes  Scheduled to see PCP on Thursday 07/24/22 @ 10:00 am [facility] f/u appt confirmed? No  Scheduled to see - on - @ - Are transportation arrangements needed? No  If their condition worsens, is the pt aware to call PCP or go to the Emergency Dept.? Yes Was the patient provided with contact information for the PCP's office or ED? No- patient declined; reports already has contact information for all care providers Was to pt encouraged to call back with questions or concerns? Yes  SDOH assessments and interventions completed:   Yes SDOH Interventions Today    Flowsheet Row Most Recent Value  SDOH Interventions   Food Insecurity Interventions Intervention Not Indicated  Transportation Interventions Intervention Not Indicated  [husband provides transportation]      Care Coordination Interventions:  Reviewed patient need of nebulizer and confirmed that PCP has addressed earlier today; provided education around process to take should she have ongoing questions/ concerns around DME    Encounter Outcome:  Pt. Visit Completed    Oneta Rack, RN, BSN, CCRN Alumnus RN CM Care Coordination/ Transition of [place] Management [phone]: direct office

## 2022-07-22 NOTE — Telephone Encounter (Signed)
Patient notified

## 2022-07-22 NOTE — Telephone Encounter (Signed)
Paperwork faxed  to [facility] in [place]

## 2022-07-24 ENCOUNTER — Ambulatory Visit (INDEPENDENT_AMBULATORY_CARE_PROVIDER_SITE_OTHER): Payer: Medicare HMO | Admitting: Physician Assistant

## 2022-07-24 ENCOUNTER — Encounter: Payer: Self-pay | Admitting: Physician Assistant

## 2022-07-24 VITALS — BP 90/55 | HR 94 | Temp 98.2°F | Ht <= 58 in | Wt 145.0 lb

## 2022-07-24 DIAGNOSIS — J441 Chronic obstructive pulmonary disease with (acute) exacerbation: Secondary | ICD-10-CM

## 2022-07-24 DIAGNOSIS — B338 Other specified viral diseases: Secondary | ICD-10-CM

## 2022-07-24 DIAGNOSIS — E538 Deficiency of other specified B group vitamins: Secondary | ICD-10-CM | POA: Diagnosis not present

## 2022-07-24 DIAGNOSIS — Z09 Encounter for follow-up examination after completed treatment for conditions other than malignant neoplasm: Secondary | ICD-10-CM

## 2022-07-24 MED ORDER — CYANOCOBALAMIN 1000 MCG/ML IJ SOLN
1000.0000 ug | Freq: Once | INTRAMUSCULAR | Status: AC
  Administered 2022-07-24: 1000 ug via INTRAMUSCULAR

## 2022-07-24 NOTE — Assessment & Plan Note (Signed)
Chronic with acute exacerbation secondary to recent RSV infection  She was hospitalized 07/13/22-07/18/22  She reports improvement in breathing and coughing but is still feeling tired and experiences SOB with exertion She is using her inhalers as directed and has picked up her nebulizer to begin using soon. PE was reassuring today - reviewed dc recommendations, she will schedule apt with Pulmonology in coming weeks for follow up and further evaluation

## 2022-07-24 NOTE — Patient Instructions (Signed)
Please schedule a follow up with your Pulmonology provider

## 2022-07-24 NOTE — Progress Notes (Signed)
Established Patient Office Visit  Name: Jordan Chan   MRN: [id number]    DOB: 06-Jan-1951   Date:07/24/2022  Today's Provider: Talitha Givens, MHS, PA-C Introduced myself to the patient as a PA-C and provided education on APPs in clinical practice.         Subjective  Chief Complaint  Chief Complaint  Patient presents with   RSV    HPI    Reports she still does not feel well but is feeling better than she was prior to hospitalization Reports SOB with walking short distances She is still using her inhalers as directed and has finished her Prednisone taper Reports cough has improved and is not as productive as it was previously   She is using her rescue inhaler very seldom- states most days she is not even needing She has picked up her Nebulizer but has not used it yet  She is using her O2 at night and does not report changes to her sleeping habits   Transition of [facility] Follow up.   Hospital/Facility: [facility]  D/C Physician:  Barb Merino, MD  D/C Date: 07/18/22  Records Requested:  Records Received: DC summary, ED summary, labs, imaging results  Records Reviewed: Hospital Dc summary, ED summary   Diagnoses on Discharge: Acute respiratory failure with hypoxia COPD with acute exacerbation Paroxysmal A. Fib Insulin dependent type 2 diabetes mellitus (HCC)   Chronic kidney disease, stage III (moderate) (HCC)   Hypertension associated with type 2 diabetes mellitus ([place])   Chronic HFrEF (heart failure with reduced ejection fraction) ([place])  Date of interactive Contact within 48 hours of discharge: 07/21/22 Contact was through: phone  Date of 7 day or 14 day face-to-face visit:    within 14 days  Outpatient Encounter Medications as of 07/24/2022  Medication Sig   ACCU-CHEK AVIVA PLUS test strip TEST THREE TIMES DAILY   Accu-Chek Softclix Lancets lancets TEST BLOOD SUGAR THREE TIMES DAILY   albuterol (PROVENTIL HFA;VENTOLIN HFA) 108 (90 BASE)  MCG/ACT inhaler Inhale 2 puffs into the lungs every 6 (six) hours as needed for wheezing or shortness of breath.    albuterol (PROVENTIL) (2.5 MG/3ML) 0.083% nebulizer solution Take 3 mLs (2.5 mg total) by nebulization every 4 (four) hours as needed for wheezing or shortness of breath.   Alcohol Swabs (DROPSAFE ALCOHOL PREP) 70 % PADS USE TWICE DAILY  WITH  SUGAR  CHECKS   apixaban (ELIQUIS) 5 MG TABS tablet Take 1 tablet (5 mg total) by mouth 2 (two) times daily.   atorvastatin (LIPITOR) 40 MG tablet Take 1 tablet (40 mg total) by mouth daily.   Blood Glucose Monitoring Suppl (TRUE METRIX METER) w/Device KIT Use to check blood sugar 4 times a day   busPIRone (BUSPAR) 5 MG tablet Take 1 tablet (5 mg total) by mouth 2 (two) times daily.   chlorpheniramine-HYDROcodone (TUSSIONEX) 10-8 MG/5ML Take 5 mLs by mouth every 12 (twelve) hours as needed for cough.   cholecalciferol (VITAMIN D3) 25 MCG (1000 UNIT) tablet Take 1,000 Units by mouth daily.   Cyanocobalamin 1000 MCG/ML KIT Inject 1,000 mcg as directed every 30 (thirty) days.   cyclobenzaprine (FLEXERIL) 5 MG tablet Take 5 mg by mouth 2 (two) times daily as needed for muscle spasms. Takes very rarely   diclofenac (VOLTAREN) 50 MG EC tablet TAKE 1 TABLET THREE TIMES DAILY   Dulaglutide (TRULICITY) 4.5 OI/7.1IW SOPN Inject 4.5 mg as directed once a week.   folic  acid (FOLVITE) 1 MG tablet TAKE 1 TABLET EVERY DAY   furosemide (LASIX) 40 MG tablet Take 1 tablet (40 mg total) by mouth daily.   gabapentin (NEURONTIN) 800 MG tablet Take 1 tablet (800 mg total) by mouth 3 (three) times daily.   guaiFENesin (MUCINEX) 600 MG 12 hr tablet Take 2 tablets (1,200 mg total) by mouth 2 (two) times daily for 14 days.   insulin degludec (TRESIBA FLEXTOUCH) 100 UNIT/ML FlexTouch Pen Inject 50 Units into the skin at bedtime.   losartan (COZAAR) 25 MG tablet Take 0.5 tablets (12.5 mg total) by mouth daily.   metFORMIN (GLUCOPHAGE) 1000 MG tablet TAKE 1 TABLET TWICE  DAILY WITH MEALS   metoprolol succinate (TOPROL-XL) 25 MG 24 hr tablet Take 0.5 tablets (12.5 mg total) by mouth daily.   montelukast (SINGULAIR) 10 MG tablet Take 1 tablet (10 mg total) by mouth at bedtime.   Nebulizers (EASY AIR COMPRESSOR NEBULIZER) MISC Use to inhaler nebulizer treatments at needed per instructions on nebulizer prescription   ondansetron (ZOFRAN) 4 MG tablet Take 1 tablet (4 mg total) by mouth every 8 (eight) hours as needed for nausea or vomiting.   PAIN MANAGEMENT INTRATHECAL, IT, PUMP 1 each by Intrathecal route. Intrathecal (IT) medication:  Morphine Patient does not remember current. Adjusted every 2.5 months at [facility] in [place].   pantoprazole (PROTONIX) 40 MG tablet Take 1 tablet (40 mg total) by mouth daily.   TRELEGY ELLIPTA 100-62.5-25 MCG/ACT AEPB INHALE 1 PUFF INTO THE LUNGS DAILY   venlafaxine XR (EFFEXOR-XR) 150 MG 24 hr capsule Take 1 capsule (150 mg total) by mouth daily.   predniSONE (DELTASONE) 10 MG tablet 3 tabs daily for 2 days 2 tabs daily for 2 days 1 tab daily for 2 days (Patient not taking: Reported on 07/24/2022)   [EXPIRED] cyanocobalamin (VITAMIN B12) injection 1,000 mcg    No facility-administered encounter medications on file as of 07/24/2022.    Diagnostic Tests Reviewed/Disposition:  CMP, CBC, viral testing, CXRs   Consults: None   Discharge Instructions: Reviewed nebulizer orders, diet and activity restriction recommendations   Disease/illness Education: patient was discharged with order for Nebulizer - PCP sent in script on 07/21/22 to facilitate breathing   Home Health/Community Services Discussions/Referrals: Pulmonology follow up recommended post -dc   Establishment or re-establishment of referral orders for community resources: none   Discussion with other health care providers: none   Assessment and Support of treatment regimen adherence: Patient has picked up nebulizer and medications   Appointments  Coordinated with: Pulmonology   Education for self-management, independent living, and ADLs: Reviewed use of nebulizer and albuterol rescue inhaler Reviewed gradual return to activity and overall breathing ability        Patient Active Problem List   Diagnosis Date Noted   Acute respiratory failure with hypoxia ([place]) 07/14/2022   COPD with acute exacerbation ([place]) 07/13/2022   Paroxysmal atrial fibrillation ([place]) 07/13/2022   Atrial fibrillation with RVR ([place]) 09/11/2021   Chronic HFrEF (heart failure with reduced ejection fraction) ([place]) 01/18/2021   Iron deficiency anemia 12/28/2020   Type 2 diabetes, controlled, with neuropathy ([place]) 07/20/2020   Insulin dependent type 2 diabetes mellitus ([place]) 07/20/2020   History of pulmonary embolus (PE) 07/04/2020   Aortic atherosclerosis ([place]) 07/01/2020   Vitamin D deficiency 07/17/2019   Stress-induced cardiomyopathy 07/23/2017   History of non-ST elevation myocardial infarction (NSTEMI)    Presence of intrathecal pump 04/27/2017   Advanced care planning/counseling discussion 11/14/2016  Cervical scoliosis 05/20/2016   Hypertension associated with type 2 diabetes mellitus ([place]) 06/18/2015   B12 deficiency 03/19/2015   Sleep apnea 11/16/2014   Allergic rhinitis 11/16/2014   History of breast cancer 11/16/2014   GERD (gastroesophageal reflux disease) 11/16/2014   COPD, moderate ([place]) 11/16/2014   Obesity 11/16/2014   Depression, major, single episode, in partial remission ([place]) 11/16/2014   Chronic kidney disease, stage III (moderate) ([place]) 11/16/2014   Hyperlipidemia associated with type 2 diabetes mellitus ([place]) 11/16/2014   History of sarcoma 09/27/2013   Chronic pain syndrome 09/30/2012    Past Surgical History:  Procedure Laterality Date   ABDOMINAL HYSTERECTOMY  1987   BACK SURGERY     Tailbone removed following fracture   BREAST SURGERY Right    mastectomy   CARDIAC CATHETERIZATION     CATARACT EXTRACTION W/PHACO  Right 05/19/2019   Procedure: CATARACT EXTRACTION PHACO AND INTRAOCULAR LENS PLACEMENT ([place]), RIGHT, DIABETIC;  Surgeon: Marchia Meiers, MD;  Location: [facility] ORS;  Service: Ophthalmology;  Laterality: Right;  Lot # [id number] H [place]: 00:43.8 CDE: 4.59   CATARACT EXTRACTION W/PHACO Left 06/16/2019   Procedure: CATARACT EXTRACTION PHACO AND INTRAOCULAR LENS PLACEMENT ([place]) LEFT VISION BLUE DIABETIC;  Surgeon: Marchia Meiers, MD;  Location: [facility] ORS;  Service: Ophthalmology;  Laterality: Left;  Lot #[id number] H [place]: 00:46.9 CDE: 6.53   COCCYX REMOVAL     COLONOSCOPY WITH PROPOFOL N/A 01/01/2017   Procedure: COLONOSCOPY WITH PROPOFOL;  Surgeon: Jonathon Bellows, MD;  Location[facility];  Service: Endoscopy;  Laterality: N/A;   COLONOSCOPY WITH PROPOFOL N/A 12/11/2021   Procedure: COLONOSCOPY WITH PROPOFOL;  Surgeon: Lin Landsman, MD;  Location: [facility] ENDOSCOPY;  Service: Gastroenterology;  Laterality: N/A;   ELBOW ARTHROSCOPY WITH TENDON RECONSTRUCTION     ESOPHAGOGASTRODUODENOSCOPY (EGD) WITH PROPOFOL N/A 01/01/2017   Procedure: ESOPHAGOGASTRODUODENOSCOPY (EGD) WITH PROPOFOL;  Surgeon: Jonathon Bellows, MD;  Location: [facility] ENDOSCOPY;  Service: Endoscopy;  Laterality: N/A;   ESOPHAGOGASTRODUODENOSCOPY (EGD) WITH PROPOFOL N/A 12/11/2021   Procedure: ESOPHAGOGASTRODUODENOSCOPY (EGD) WITH PROPOFOL;  Surgeon: Lin Landsman, MD;  Location: [facility] ENDOSCOPY;  Service: Gastroenterology;  Laterality: N/A;   INTRATHECAL PUMP IMPLANT     LEFT HEART CATH AND CORONARY ANGIOGRAPHY N/A 04/28/2017   Procedure: LEFT HEART CATH AND CORONARY ANGIOGRAPHY;  Surgeon: [name], MD;  Location: [facility];  Service: Cardiovascular;  Laterality: N/A;   MASTECTOMY Right 06/1997   morphine pump  2011   PLANTAR FASCIA RELEASE     TRIGGER FINGER RELEASE      Family History  Problem Relation Age of Onset   Cancer Mother 65       lung   Thyroid disease Mother    Cancer Father 6       Pancreatic   Hypertension  Sister    Cancer Sister        "cancer on face"   Hyperlipidemia Sister    Osteoporosis Maternal Grandmother    Cancer Paternal Grandmother        colon   Arthritis Paternal Grandfather    Hyperlipidemia Son    Seizures Son    Breast cancer Neg Hx     Social History   Tobacco Use   Smoking status: Former    Packs/day: 1.00    Years: 30.00    Total pack years: 30.00    Types: Cigarettes    Quit date: 04/30/2004    Years since quitting: 18.2   Smokeless tobacco: Never  Substance Use Topics   Alcohol use: No  Current Outpatient Medications:    ACCU-CHEK AVIVA PLUS test strip, TEST THREE TIMES DAILY, Disp: 300 strip, Rfl: 3   Accu-Chek Softclix Lancets lancets, TEST BLOOD SUGAR THREE TIMES DAILY, Disp: 300 each, Rfl: 0   albuterol (PROVENTIL HFA;VENTOLIN HFA) 108 (90 BASE) MCG/ACT inhaler, Inhale 2 puffs into the lungs every 6 (six) hours as needed for wheezing or shortness of breath. , Disp: , Rfl:    albuterol (PROVENTIL) (2.5 MG/3ML) 0.083% nebulizer solution, Take 3 mLs (2.5 mg total) by nebulization every 4 (four) hours as needed for wheezing or shortness of breath., Disp: 75 mL, Rfl: 2   Alcohol Swabs (DROPSAFE ALCOHOL PREP) 70 % PADS, USE TWICE DAILY  WITH  SUGAR  CHECKS, Disp: 200 each, Rfl: 2   apixaban (ELIQUIS) 5 MG TABS tablet, Take 1 tablet (5 mg total) by mouth 2 (two) times daily., Disp: 180 tablet, Rfl: 4   atorvastatin (LIPITOR) 40 MG tablet, Take 1 tablet (40 mg total) by mouth daily., Disp: 90 tablet, Rfl: 3   Blood Glucose Monitoring Suppl (TRUE METRIX METER) w/Device KIT, Use to check blood sugar 4 times a day, Disp: 1 kit, Rfl: 4   busPIRone (BUSPAR) 5 MG tablet, Take 1 tablet (5 mg total) by mouth 2 (two) times daily., Disp: 60 tablet, Rfl: 12   chlorpheniramine-HYDROcodone (TUSSIONEX) 10-8 MG/5ML, Take 5 mLs by mouth every 12 (twelve) hours as needed for cough., Disp: 115 mL, Rfl: 0   cholecalciferol (VITAMIN D3) 25 MCG (1000 UNIT) tablet, Take 1,000  Units by mouth daily., Disp: , Rfl:    Cyanocobalamin 1000 MCG/ML KIT, Inject 1,000 mcg as directed every 30 (thirty) days., Disp: , Rfl:    cyclobenzaprine (FLEXERIL) 5 MG tablet, Take 5 mg by mouth 2 (two) times daily as needed for muscle spasms. Takes very rarely, Disp: , Rfl:    diclofenac (VOLTAREN) 50 MG EC tablet, TAKE 1 TABLET THREE TIMES DAILY, Disp: 270 tablet, Rfl: 0   Dulaglutide (TRULICITY) 4.5 ZO/1.0RU SOPN, Inject 4.5 mg as directed once a week., Disp: 6 mL, Rfl: 4   folic acid (FOLVITE) 1 MG tablet, TAKE 1 TABLET EVERY DAY, Disp: 90 tablet, Rfl: 0   furosemide (LASIX) 40 MG tablet, Take 1 tablet (40 mg total) by mouth daily., Disp: 90 tablet, Rfl: 4   gabapentin (NEURONTIN) 800 MG tablet, Take 1 tablet (800 mg total) by mouth 3 (three) times daily., Disp: 270 tablet, Rfl: 4   guaiFENesin (MUCINEX) 600 MG 12 hr tablet, Take 2 tablets (1,200 mg total) by mouth 2 (two) times daily for 14 days., Disp: 56 tablet, Rfl: 0   insulin degludec (TRESIBA FLEXTOUCH) 100 UNIT/ML FlexTouch Pen, Inject 50 Units into the skin at bedtime., Disp: , Rfl:    losartan (COZAAR) 25 MG tablet, Take 0.5 tablets (12.5 mg total) by mouth daily., Disp: 45 tablet, Rfl: 4   metFORMIN (GLUCOPHAGE) 1000 MG tablet, TAKE 1 TABLET TWICE DAILY WITH MEALS, Disp: 180 tablet, Rfl: 4   metoprolol succinate (TOPROL-XL) 25 MG 24 hr tablet, Take 0.5 tablets (12.5 mg total) by mouth daily., Disp: 45 tablet, Rfl: 4   montelukast (SINGULAIR) 10 MG tablet, Take 1 tablet (10 mg total) by mouth at bedtime., Disp: 90 tablet, Rfl: 4   Nebulizers (EASY AIR COMPRESSOR NEBULIZER) MISC, Use to inhaler nebulizer treatments at needed per instructions on nebulizer prescription, Disp: 1 each, Rfl: 0   ondansetron (ZOFRAN) 4 MG tablet, Take 1 tablet (4 mg total) by mouth every 8 (eight) hours as  needed for nausea or vomiting., Disp: 45 tablet, Rfl: 0   PAIN MANAGEMENT INTRATHECAL, IT, PUMP, 1 each by Intrathecal route. Intrathecal (IT)  medication:  Morphine Patient does not remember current. Adjusted every 2.5 months at [facility] in [place]., Disp: , Rfl:    pantoprazole (PROTONIX) 40 MG tablet, Take 1 tablet (40 mg total) by mouth daily., Disp: 90 tablet, Rfl: 4   TRELEGY ELLIPTA 100-62.5-25 MCG/ACT AEPB, INHALE 1 PUFF INTO THE LUNGS DAILY, Disp: 180 each, Rfl: 4   venlafaxine XR (EFFEXOR-XR) 150 MG 24 hr capsule, Take 1 capsule (150 mg total) by mouth daily., Disp: 90 capsule, Rfl: 4   predniSONE (DELTASONE) 10 MG tablet, 3 tabs daily for 2 days 2 tabs daily for 2 days 1 tab daily for 2 days (Patient not taking: Reported on 07/24/2022), Disp: 12 tablet, Rfl: 0  Allergies  Allergen Reactions   Other Palpitations    IV steroids   Pain Patch [Menthol] Anaphylaxis   Avelox [Moxifloxacin Hcl In Nacl] Other (See Comments)    Upset stomach   Doxycycline Diarrhea   Erythromycin Nausea Only and Other (See Comments)    Can take a Z-Pak just fine Other reaction(s): Other (See Comments) Can take Z-Pak  Can take a Z-Pak just fine   Fentanyl Nausea Only and Rash   Moxifloxacin Hcl Other (See Comments)    Upset stomach Upset stomach   Oxycontin [Oxycodone] Hives   Ozempic [Semaglutide] Nausea Only    I personally reviewed active problem list, medication list, allergies, notes from last encounter, lab results, imaging with the patient/caregiver today.   Review of Systems  Constitutional:  Positive for malaise/fatigue.  Respiratory:  Positive for cough, shortness of breath and wheezing. Negative for sputum production.       Objective  Vitals:   07/24/22 1020  BP: (!) 90/55  Pulse: 94  Temp: 98.2 F (36.8 C)  TempSrc: Oral  SpO2: 94%  Weight: 145 lb (65.8 kg)  Height: _0  (1.473 m)    Body mass index is 30.31 kg/m.  Physical Exam Vitals reviewed.  Constitutional:      General: She is awake.     Appearance: Normal appearance. She is well-developed and well-groomed.  HENT:      Head: Normocephalic and atraumatic.  Eyes:     General: Lids are normal. Gaze aligned appropriately.     Extraocular Movements: Extraocular movements intact.     Conjunctiva/sclera: Conjunctivae normal.     Pupils: Pupils are equal, round, and reactive to light.  Cardiovascular:     Rate and Rhythm: Normal rate and regular rhythm.     Pulses: Normal pulses.          Radial pulses are 2+ on the right side and 2+ on the left side.     Heart sounds: Normal heart sounds.  Pulmonary:     Effort: Pulmonary effort is normal.     Breath sounds: Decreased air movement present. No decreased breath sounds, wheezing, rhonchi or rales.  Musculoskeletal:     Cervical back: Normal range of motion.     Right lower leg: No edema.     Left lower leg: No edema.  Neurological:     General: No focal deficit present.     Mental Status: She is alert and oriented to person, place, and time.     GCS: GCS eye subscore is 4. GCS verbal subscore is 5. GCS motor subscore is 6.  Psychiatric:  Attention and Perception: Attention and perception normal.        Mood and Affect: Mood and affect normal.        Speech: Speech normal.        Behavior: Behavior normal. Behavior is cooperative.      Recent Results (from the past 2160 hour(s))  Basic metabolic panel     Status: Abnormal   Collection Time: 05/07/22  2:00 PM  Result Value Ref Range   Sodium 135 135 - 145 mmol/L   Potassium 3.7 3.5 - 5.1 mmol/L   Chloride 100 98 - 111 mmol/L   CO2 29 22 - 32 mmol/L   Glucose, Bld 237 (H) 70 - 99 mg/dL    Comment: Glucose reference range applies only to samples taken after fasting for at least 8 hours.   BUN 22 8 - 23 mg/dL   Creatinine, Ser 1.35 (H) 0.44 - 1.00 mg/dL   Calcium 8.7 (L) 8.9 - 10.3 mg/dL   GFR, Estimated 42 (L) >60 mL/min    Comment: (NOTE) Calculated using the CKD-EPI Creatinine Equation (2021)    Anion gap 6 5 - 15    Comment: Performed at [facility], [address]  [place], [place] [address]  CBC     Status: Abnormal   Collection Time: 05/07/22  2:00 PM  Result Value Ref Range   WBC 9.1 4.0 - 10.5 K/uL   RBC 3.73 (L) 3.87 - 5.11 MIL/uL   Hemoglobin 11.3 (L) 12.0 - 15.0 g/dL   HCT 36.0 36.0 - 46.0 %   MCV 96.5 80.0 - 100.0 fL   MCH 30.3 26.0 - 34.0 pg   MCHC 31.4 30.0 - 36.0 g/dL   RDW 12.9 11.5 - 15.5 %   Platelets 349 150 - 400 K/uL   nRBC 0.0 0.0 - 0.2 %    Comment: Performed at [facility], [place], [place] [address]  Troponin I (High Sensitivity)     Status: None   Collection Time: 05/07/22  2:00 PM  Result Value Ref Range   Troponin I (High Sensitivity) 5 <18 ng/L    Comment: (NOTE) Elevated high sensitivity troponin I (hsTnI) values and significant  changes across serial measurements may suggest ACS but many other  chronic and acute conditions are known to elevate hsTnI results.  Refer to the "Links" section for chest pain algorithms and additional  guidance. Performed at [facility], [address] [place], [place] [address]   Basic metabolic panel     Status: Abnormal   Collection Time: 05/20/22 11:26 AM  Result Value Ref Range   Glucose 251 (H) 70 - 99 mg/dL   BUN 13 8 - 27 mg/dL   Creatinine, Ser 1.01 (H) 0.57 - 1.00 mg/dL   eGFR 59 (L) >59 mL/min/1.73   BUN/Creatinine Ratio 13 12 - 28   Sodium 135 134 - 144 mmol/L   Potassium 4.8 3.5 - 5.2 mmol/L   Chloride 94 (L) 96 - 106 mmol/L   CO2 26 20 - 29 mmol/L   Calcium 10.1 8.7 - 10.3 mg/dL  CBC with Differential/Platelet     Status: None   Collection Time: 05/20/22 11:26 AM  Result Value Ref Range   WBC 9.5 3.4 - 10.8 x10E3/uL   RBC 3.88 3.77 - 5.28 x10E6/uL   Hemoglobin 12.2 11.1 - 15.9 g/dL   Hematocrit 37.4 34.0 - 46.6 %   MCV 96 79 - 97 fL   MCH 31.4 26.6 -  33.0 pg   MCHC 32.6 31.5 - 35.7 g/dL   RDW 12.3 11.7 - 15.4 %   Platelets 424 150 - 450 x10E3/uL   Neutrophils 59 Not Estab. %   Lymphs 29 Not Estab. %   Monocytes 7 Not Estab.  %   Eos 4 Not Estab. %   Basos 1 Not Estab. %   Neutrophils Absolute 5.6 1.4 - 7.0 x10E3/uL   Lymphocytes Absolute 2.7 0.7 - 3.1 x10E3/uL   Monocytes Absolute 0.7 0.1 - 0.9 x10E3/uL   EOS (ABSOLUTE) 0.4 0.0 - 0.4 x10E3/uL   Basophils Absolute 0.1 0.0 - 0.2 x10E3/uL   Immature Granulocytes 0 Not Estab. %   Immature Grans (Abs) 0.0 0.0 - 0.1 x10E3/uL  Iron Binding Cap (TIBC)([facility])     Status: None   Collection Time: 05/20/22 11:26 AM  Result Value Ref Range   Total Iron Binding Capacity 339 250 - 450 ug/dL   UIBC 283 118 - 369 ug/dL   Iron 56 27 - 139 ug/dL   Iron Saturation 17 15 - 55 %  Ferritin     Status: None   Collection Time: 05/20/22 11:26 AM  Result Value Ref Range   Ferritin 47 15 - 150 ng/mL  Bayer DCA Hb A1c Waived     Status: Abnormal   Collection Time: 06/24/22  8:18 AM  Result Value Ref Range   HB A1C (BAYER DCA - WAIVED) 7.6 (H) 4.8 - 5.6 %    Comment:          Prediabetes: 5.7 - 6.4          Diabetes: >6.4          Glycemic control for adults with diabetes: <7.0   Comprehensive metabolic panel     Status: Abnormal   Collection Time: 06/24/22  8:21 AM  Result Value Ref Range   Glucose 153 (H) 70 - 99 mg/dL   BUN 15 8 - 27 mg/dL   Creatinine, Ser 0.99 0.57 - 1.00 mg/dL   eGFR 61 >59 mL/min/1.73   BUN/Creatinine Ratio 15 12 - 28   Sodium 138 134 - 144 mmol/L   Potassium 5.2 3.5 - 5.2 mmol/L   Chloride 99 96 - 106 mmol/L   CO2 28 20 - 29 mmol/L   Calcium 9.9 8.7 - 10.3 mg/dL   Total Protein 7.1 6.0 - 8.5 g/dL   Albumin 4.1 3.8 - 4.8 g/dL   Globulin, Total 3.0 1.5 - 4.5 g/dL   Albumin/Globulin Ratio 1.4 1.2 - 2.2   Bilirubin Total <0.2 0.0 - 1.2 mg/dL   Alkaline Phosphatase 73 44 - 121 IU/L   AST 10 0 - 40 IU/L   ALT 7 0 - 32 IU/L  Lipid Panel w/o Chol/HDL Ratio     Status: None   Collection Time: 06/24/22  8:21 AM  Result Value Ref Range   Cholesterol, Total 167 100 - 199 mg/dL   Triglycerides 129 0 - 149 mg/dL   HDL 59 >39 mg/dL   VLDL  Cholesterol Cal 23 5 - 40 mg/dL   LDL Chol Calc (NIH) 85 0 - 99 mg/dL  Vitamin B12     Status: None   Collection Time: 06/24/22  8:21 AM  Result Value Ref Range   Vitamin B-12 327 232 - 1,245 pg/mL  Magnesium     Status: None   Collection Time: 06/24/22  8:21 AM  Result Value Ref Range   Magnesium 1.7 1.6 - 2.3 mg/dL  Basic metabolic  panel     Status: Abnormal   Collection Time: 07/13/22  9:45 AM  Result Value Ref Range   Sodium 135 135 - 145 mmol/L   Potassium 4.4 3.5 - 5.1 mmol/L   Chloride 100 98 - 111 mmol/L   CO2 27 22 - 32 mmol/L   Glucose, Bld 204 (H) 70 - 99 mg/dL    Comment: Glucose reference range applies only to samples taken after fasting for at least 8 hours.   BUN 13 8 - 23 mg/dL   Creatinine, Ser 1.12 (H) 0.44 - 1.00 mg/dL   Calcium 9.1 8.9 - 10.3 mg/dL   GFR, Estimated 52 (L) >60 mL/min    Comment: (NOTE) Calculated using the CKD-EPI Creatinine Equation (2021)    Anion gap 8 5 - 15    Comment: Performed at [facility], [address] [place], [place] [address]  CBC     Status: Abnormal   Collection Time: 07/13/22  9:45 AM  Result Value Ref Range   WBC 11.3 (H) 4.0 - 10.5 K/uL   RBC 4.03 3.87 - 5.11 MIL/uL   Hemoglobin 12.3 12.0 - 15.0 g/dL   HCT 38.9 36.0 - 46.0 %   MCV 96.5 80.0 - 100.0 fL   MCH 30.5 26.0 - 34.0 pg   MCHC 31.6 30.0 - 36.0 g/dL   RDW 13.2 11.5 - 15.5 %   Platelets 384 150 - 400 K/uL   nRBC 0.0 0.0 - 0.2 %    Comment: Performed at [facility], [place], [place] [address]  Troponin I (High Sensitivity)     Status: None   Collection Time: 07/13/22  9:45 AM  Result Value Ref Range   Troponin I (High Sensitivity) 5 <18 ng/L    Comment: (NOTE) Elevated high sensitivity troponin I (hsTnI) values and significant  changes across serial measurements may suggest ACS but many other  chronic and acute conditions are known to elevate hsTnI results.  Refer to the "Links" section for chest pain algorithms and  additional  guidance. Performed at [facility], [address] [place], [place] [address]   Resp panel by RT-PCR (RSV, Flu A&B, Covid) Anterior Nasal Swab     Status: Abnormal   Collection Time: 07/13/22 11:00 AM   Specimen: Anterior Nasal Swab  Result Value Ref Range   SARS Coronavirus 2 by RT PCR NEGATIVE NEGATIVE    Comment: (NOTE) SARS-CoV-2 target nucleic acids are NOT DETECTED.  The SARS-CoV-2 RNA is generally detectable in upper respiratory specimens during the acute phase of infection. The lowest concentration of SARS-CoV-2 viral copies this assay can detect is 138 copies/mL. A negative result does not preclude SARS-Cov-2 infection and should not be used as the sole basis for treatment or other patient management decisions. A negative result may occur with  improper specimen collection/handling, submission of specimen other than nasopharyngeal swab, presence of viral mutation(s) within the areas targeted by this assay, and inadequate number of viral copies(<138 copies/mL). A negative result must be combined with clinical observations, patient history, and epidemiological information. The expected result is Negative.  Fact Sheet for Patients:  EntrepreneurPulse.com.au  Fact Sheet for Healthcare Providers:  IncredibleEmployment.be  This test is no t yet approved or cleared by the [place] FDA and  has been authorized for detection and/or diagnosis of SARS-CoV-2 by FDA under an Emergency Use Authorization (EUA). This EUA will remain  in effect (meaning this test can be used) for the duration of  the COVID-19 declaration under Section 564(b)(1) of the Act, 21 U.S.C.section 360bbb-3(b)(1), unless the authorization is terminated  or revoked sooner.       Influenza A by PCR NEGATIVE NEGATIVE   Influenza B by PCR NEGATIVE NEGATIVE    Comment: (NOTE) The Xpert Xpress SARS-CoV-2/FLU/RSV plus assay is intended as an aid in  the diagnosis of influenza from Nasopharyngeal swab specimens and should not be used as a sole basis for treatment. Nasal washings and aspirates are unacceptable for Xpert Xpress SARS-CoV-2/FLU/RSV testing.  Fact Sheet for Patients: EntrepreneurPulse.com.au  Fact Sheet for Healthcare Providers: IncredibleEmployment.be  This test is not yet approved or cleared by the [place] FDA and has been authorized for detection and/or diagnosis of SARS-CoV-2 by FDA under an Emergency Use Authorization (EUA). This EUA will remain in effect (meaning this test can be used) for the duration of the COVID-19 declaration under Section 564(b)(1) of the Act, 21 U.S.C. section 360bbb-3(b)(1), unless the authorization is terminated or revoked.     Resp Syncytial Virus by PCR POSITIVE (A) NEGATIVE    Comment: (NOTE) Fact Sheet for Patients: EntrepreneurPulse.com.au  Fact Sheet for Healthcare Providers: IncredibleEmployment.be  This test is not yet approved or cleared by the [place] FDA and has been authorized for detection and/or diagnosis of SARS-CoV-2 by FDA under an Emergency Use Authorization (EUA). This EUA will remain in effect (meaning this test can be used) for the duration of the COVID-19 declaration under Section 564(b)(1) of the Act, 21 U.S.C. section 360bbb-3(b)(1), unless the authorization is terminated or revoked.  Performed at [facility], [address] [place], [place] [address]   Procalcitonin - Baseline     Status: None   Collection Time: 07/13/22  1:55 PM  Result Value Ref Range   Procalcitonin <0.10 ng/mL    Comment:        Interpretation: PCT (Procalcitonin) <= 0.5 ng/mL: Systemic infection (sepsis) is not likely. Local bacterial infection is possible. (NOTE)       Sepsis PCT Algorithm           Lower Respiratory Tract                                      Infection PCT  Algorithm    ----------------------------     ----------------------------         PCT < 0.25 ng/mL                PCT < 0.10 ng/mL          Strongly encourage             Strongly discourage   discontinuation of antibiotics    initiation of antibiotics    ----------------------------     -----------------------------       PCT 0.25 - 0.50 ng/mL            PCT 0.10 - 0.25 ng/mL               OR       >80% decrease in PCT            Discourage initiation of                                            antibiotics  Encourage discontinuation           of antibiotics    ----------------------------     -----------------------------         PCT >= 0.50 ng/mL              PCT 0.26 - 0.50 ng/mL               AND        <80% decrease in PCT             Encourage initiation of                                             antibiotics       Encourage continuation           of antibiotics    ----------------------------     -----------------------------        PCT >= 0.50 ng/mL                  PCT > 0.50 ng/mL               AND         increase in PCT                  Strongly encourage                                      initiation of antibiotics    Strongly encourage escalation           of antibiotics                                     -----------------------------                                           PCT <= 0.25 ng/mL                                                 OR                                        > 80% decrease in PCT                                      Discontinue / Do not initiate                                             antibiotics  Performed at [facility], [address] [place], [place] [address]   Troponin I (High Sensitivity)     Status: None   Collection Time: 07/13/22  2:24 PM  Result Value Ref Range   Troponin I (High Sensitivity) 5 <18 ng/L    Comment: (NOTE) Elevated high sensitivity troponin I (hsTnI) values and significant  changes  across serial measurements may suggest ACS but many other  chronic and acute conditions are known to elevate hsTnI results.  Refer to the "Links" section for chest pain algorithms and additional  guidance. Performed at [facility], [address] [place], [place] [address]   CBG monitoring, ED     Status: Abnormal   Collection Time: 07/13/22  5:33 PM  Result Value Ref Range   Glucose-Capillary 379 (H) 70 - 99 mg/dL    Comment: Glucose reference range applies only to samples taken after fasting for at least 8 hours.  CBG monitoring, ED     Status: Abnormal   Collection Time: 07/13/22  8:26 PM  Result Value Ref Range   Glucose-Capillary 316 (H) 70 - 99 mg/dL    Comment: Glucose reference range applies only to samples taken after fasting for at least 8 hours.  HIV Antibody (routine testing w rflx)     Status: None   Collection Time: 07/14/22  6:45 AM  Result Value Ref Range   HIV Screen 4th Generation wRfx Non Reactive Non Reactive    Comment: Performed at [facility], [address] [place], [place] [address]  CBC with Differential/Platelet     Status: Abnormal   Collection Time: 07/14/22  6:45 AM  Result Value Ref Range   WBC 10.9 (H) 4.0 - 10.5 K/uL   RBC 3.56 (L) 3.87 - 5.11 MIL/uL   Hemoglobin 10.7 (L) 12.0 - 15.0 g/dL   HCT 33.4 (L) 36.0 - 46.0 %   MCV 93.8 80.0 - 100.0 fL   MCH 30.1 26.0 - 34.0 pg   MCHC 32.0 30.0 - 36.0 g/dL   RDW 13.3 11.5 - 15.5 %   Platelets 373 150 - 400 K/uL   nRBC 0.0 0.0 - 0.2 %   Neutrophils Relative % 69 %   Neutro Abs 7.6 1.7 - 7.7 K/uL   Lymphocytes Relative 18 %   Lymphs Abs 1.9 0.7 - 4.0 K/uL   Monocytes Relative 10 %   Monocytes Absolute 1.1 (H) 0.1 - 1.0 K/uL   Eosinophils Relative 1 %   Eosinophils Absolute 0.1 0.0 - 0.5 K/uL   Basophils Relative 1 %   Basophils Absolute 0.1 0.0 - 0.1 K/uL   Immature Granulocytes 1 %   Abs Immature Granulocytes 0.06 0.00 - 0.07 K/uL    Comment: Performed at [facility],  [address], [place], [place] [address]  Basic metabolic panel     Status: Abnormal   Collection Time: 07/14/22  6:45 AM  Result Value Ref Range   Sodium 136 135 - 145 mmol/L   Potassium 3.9 3.5 - 5.1 mmol/L   Chloride 99 98 - 111 mmol/L   CO2 29 22 - 32 mmol/L   Glucose, Bld 202 (H) 70 - 99 mg/dL    Comment: Glucose reference range applies only to samples taken after fasting for at least 8 hours.   BUN 14 8 - 23 mg/dL   Creatinine, Ser 1.02 (H) 0.44 - 1.00 mg/dL   Calcium 8.5 (L) 8.9 - 10.3 mg/dL   GFR, Estimated 58 (L) >60 mL/min    Comment: (NOTE) Calculated using the CKD-EPI Creatinine Equation (2021)    Anion gap 8 5 - 15    Comment: Performed at [facility], [address] [place], [place] [address]  Glucose, capillary     Status: Abnormal   Collection Time: 07/14/22  8:03 AM  Result Value Ref Range   Glucose-Capillary 149 (H) 70 - 99 mg/dL    Comment: Glucose reference range applies only to samples taken after fasting for at least 8 hours.  Glucose, capillary     Status: Abnormal   Collection Time: 07/14/22 11:44 AM  Result Value Ref Range   Glucose-Capillary 199 (H) 70 - 99 mg/dL    Comment: Glucose reference range applies only to samples taken after fasting for at least 8 hours.  Glucose, capillary     Status: Abnormal   Collection Time: 07/14/22  4:29 PM  Result Value Ref Range   Glucose-Capillary 224 (H) 70 - 99 mg/dL    Comment: Glucose reference range applies only to samples taken after fasting for at least 8 hours.  Glucose, capillary     Status: Abnormal   Collection Time: 07/14/22  9:39 PM  Result Value Ref Range   Glucose-Capillary 225 (H) 70 - 99 mg/dL    Comment: Glucose reference range applies only to samples taken after fasting for at least 8 hours.   Comment 1 Notify RN   CBC     Status: Abnormal   Collection Time: 07/15/22  2:41 AM  Result Value Ref Range   WBC 13.8 (H) 4.0 - 10.5 K/uL   RBC 3.70 (L) 3.87 - 5.11 MIL/uL   Hemoglobin  11.3 (L) 12.0 - 15.0 g/dL   HCT 35.2 (L) 36.0 - 46.0 %   MCV 95.1 80.0 - 100.0 fL   MCH 30.5 26.0 - 34.0 pg   MCHC 32.1 30.0 - 36.0 g/dL   RDW 13.1 11.5 - 15.5 %   Platelets 384 150 - 400 K/uL   nRBC 0.0 0.0 - 0.2 %    Comment: Performed at [facility], [address] [place], [place] [address]  Glucose, capillary     Status: Abnormal   Collection Time: 07/15/22  8:27 AM  Result Value Ref Range   Glucose-Capillary 169 (H) 70 - 99 mg/dL    Comment: Glucose reference range applies only to samples taken after fasting for at least 8 hours.  Glucose, capillary     Status: Abnormal   Collection Time: 07/15/22 11:51 AM  Result Value Ref Range   Glucose-Capillary 203 (H) 70 - 99 mg/dL    Comment: Glucose reference range applies only to samples taken after fasting for at least 8 hours.  Glucose, capillary     Status: Abnormal   Collection Time: 07/15/22  4:31 PM  Result Value Ref Range   Glucose-Capillary 295 (H) 70 - 99 mg/dL    Comment: Glucose reference range applies only to samples taken after fasting for at least 8 hours.  Glucose, capillary     Status: Abnormal   Collection Time: 07/15/22  9:22 PM  Result Value Ref Range   Glucose-Capillary 228 (H) 70 - 99 mg/dL    Comment: Glucose reference range applies only to samples taken after fasting for at least 8 hours.   Comment 1 Notify RN   Basic metabolic panel     Status: Abnormal   Collection Time: 07/16/22  2:43 AM  Result Value Ref Range   Sodium 134 (L) 135 - 145 mmol/L   Potassium 3.8 3.5 - 5.1 mmol/L   Chloride 96 (L) 98 - 111 mmol/L   CO2 28 22 - 32 mmol/L   Glucose,  Bld 252 (H) 70 - 99 mg/dL    Comment: Glucose reference range applies only to samples taken after fasting for at least 8 hours.   BUN 28 (H) 8 - 23 mg/dL   Creatinine, Ser 1.29 (H) 0.44 - 1.00 mg/dL   Calcium 8.7 (L) 8.9 - 10.3 mg/dL   GFR, Estimated 44 (L) >60 mL/min    Comment: (NOTE) Calculated using the CKD-EPI Creatinine Equation (2021)     Anion gap 10 5 - 15    Comment: Performed at [facility], [address] [place], [place] [address]  CBC     Status: Abnormal   Collection Time: 07/16/22  2:43 AM  Result Value Ref Range   WBC 15.4 (H) 4.0 - 10.5 K/uL   RBC 4.01 3.87 - 5.11 MIL/uL   Hemoglobin 12.1 12.0 - 15.0 g/dL   HCT 37.9 36.0 - 46.0 %   MCV 94.5 80.0 - 100.0 fL   MCH 30.2 26.0 - 34.0 pg   MCHC 31.9 30.0 - 36.0 g/dL   RDW 12.9 11.5 - 15.5 %   Platelets 457 (H) 150 - 400 K/uL   nRBC 0.0 0.0 - 0.2 %    Comment: Performed at [facility], [address] [place], [place] [address]  Glucose, capillary     Status: Abnormal   Collection Time: 07/16/22  7:54 AM  Result Value Ref Range   Glucose-Capillary 241 (H) 70 - 99 mg/dL    Comment: Glucose reference range applies only to samples taken after fasting for at least 8 hours.  Glucose, capillary     Status: Abnormal   Collection Time: 07/16/22 11:54 AM  Result Value Ref Range   Glucose-Capillary 129 (H) 70 - 99 mg/dL    Comment: Glucose reference range applies only to samples taken after fasting for at least 8 hours.  Glucose, capillary     Status: Abnormal   Collection Time: 07/16/22  5:05 PM  Result Value Ref Range   Glucose-Capillary 367 (H) 70 - 99 mg/dL    Comment: Glucose reference range applies only to samples taken after fasting for at least 8 hours.  Glucose, capillary     Status: Abnormal   Collection Time: 07/16/22  5:59 PM  Result Value Ref Range   Glucose-Capillary 400 (H) 70 - 99 mg/dL    Comment: Glucose reference range applies only to samples taken after fasting for at least 8 hours.  Glucose, capillary     Status: Abnormal   Collection Time: 07/16/22  9:36 PM  Result Value Ref Range   Glucose-Capillary 229 (H) 70 - 99 mg/dL    Comment: Glucose reference range applies only to samples taken after fasting for at least 8 hours.  Glucose, capillary     Status: Abnormal   Collection Time: 07/17/22  7:46 AM  Result Value Ref  Range   Glucose-Capillary 155 (H) 70 - 99 mg/dL    Comment: Glucose reference range applies only to samples taken after fasting for at least 8 hours.  Glucose, capillary     Status: Abnormal   Collection Time: 07/17/22 12:05 PM  Result Value Ref Range   Glucose-Capillary 255 (H) 70 - 99 mg/dL    Comment: Glucose reference range applies only to samples taken after fasting for at least 8 hours.  Glucose, capillary     Status: Abnormal   Collection Time: 07/17/22 12:05 PM  Result Value Ref Range   Glucose-Capillary 255 (H) 70 - 99 mg/dL  Comment: Glucose reference range applies only to samples taken after fasting for at least 8 hours.  Glucose, capillary     Status: Abnormal   Collection Time: 07/17/22  4:24 PM  Result Value Ref Range   Glucose-Capillary 372 (H) 70 - 99 mg/dL    Comment: Glucose reference range applies only to samples taken after fasting for at least 8 hours.  Glucose, capillary     Status: Abnormal   Collection Time: 07/17/22  9:10 PM  Result Value Ref Range   Glucose-Capillary 289 (H) 70 - 99 mg/dL    Comment: Glucose reference range applies only to samples taken after fasting for at least 8 hours.  Glucose, capillary     Status: Abnormal   Collection Time: 07/18/22  8:28 AM  Result Value Ref Range   Glucose-Capillary 192 (H) 70 - 99 mg/dL    Comment: Glucose reference range applies only to samples taken after fasting for at least 8 hours.  Glucose, capillary     Status: Abnormal   Collection Time: 07/18/22 11:24 AM  Result Value Ref Range   Glucose-Capillary 252 (H) 70 - 99 mg/dL    Comment: Glucose reference range applies only to samples taken after fasting for at least 8 hours.     PHQ2/9:    07/24/2022   10:24 AM 06/24/2022    8:21 AM 05/20/2022   10:40 AM 03/24/2022    9:28 AM 03/14/2022    9:26 AM  Depression screen PHQ 2/9  Decreased Interest 2 0 1 0 0  Down, Depressed, Hopeless _0 PHQ - 2 Score _1 Altered sleeping _2 Tired, decreased energy _3 Change in appetite _4 Feeling bad or failure about yourself  0 0 0 0 0  Trouble concentrating _5 0 0  Moving slowly or fidgety/restless 0 0 0 0 0  Suicidal thoughts 0 0 0 0 0  PHQ-9 Score _6 Difficult doing work/chores Somewhat difficult Not difficult at all Not difficult at all Not difficult at all Somewhat difficult      Fall Risk:    06/24/2022    8:21 AM 05/20/2022   10:40 AM 03/24/2022    9:25 AM 03/14/2022    9:26 AM 01/22/2022   10:59 AM  Fall Risk   Falls in the past year? 0 0 0 0 0  Number falls in past yr: 0 0 0 0 0  Injury with Fall? 0 0 0 0 0  Risk for fall due to : History of fall(s) No Fall Risks  No Fall Risks No Fall Risks  Follow up Falls evaluation completed Falls evaluation completed  Falls evaluation completed Falls evaluation completed      Functional Status Survey:      Assessment & Plan  Problem List Items Addressed This Visit       Respiratory   COPD with acute exacerbation ([place]) - Primary    Chronic with acute exacerbation secondary to recent RSV infection  She was hospitalized 07/13/22-07/18/22  She reports improvement in breathing and coughing but is still feeling tired and experiences SOB with exertion She is using her inhalers as directed and has picked up her nebulizer to begin using soon. PE was reassuring today - reviewed dc recommendations, she will schedule apt with Pulmonology in coming weeks for follow up and further evaluation  Other   B12 deficiency    B12 shot administered today per patient request       Other Visit Diagnoses     RSV (respiratory syncytial virus infection)       Hospital discharge follow-up            No follow-ups on file.   I, Arlean Thies E Amit Leece, PA-C, have reviewed all documentation for this visit. The documentation on 07/24/22 for the exam, diagnosis, procedures, and orders are all accurate and complete.   Talitha Givens, MHS,  PA-C [facility]

## 2022-07-24 NOTE — Assessment & Plan Note (Signed)
B12 shot administered today per patient request

## 2022-08-07 ENCOUNTER — Encounter: Payer: Self-pay | Admitting: Oncology

## 2022-08-13 ENCOUNTER — Other Ambulatory Visit: Payer: Self-pay | Admitting: *Deleted

## 2022-08-13 ENCOUNTER — Telehealth: Payer: Self-pay

## 2022-08-13 DIAGNOSIS — D729 Disorder of white blood cells, unspecified: Secondary | ICD-10-CM

## 2022-08-13 DIAGNOSIS — D72828 Other elevated white blood cell count: Secondary | ICD-10-CM

## 2022-08-13 DIAGNOSIS — D75839 Thrombocytosis, unspecified: Secondary | ICD-10-CM

## 2022-08-13 NOTE — Telephone Encounter (Signed)
PA started for Trulicity 4.'5mg'$ /0.40m through [employer]. Awaiting on determination

## 2022-08-14 ENCOUNTER — Inpatient Hospital Stay: Payer: Medicare Other | Attending: Oncology

## 2022-08-14 ENCOUNTER — Telehealth: Payer: Self-pay

## 2022-08-14 DIAGNOSIS — D72829 Elevated white blood cell count, unspecified: Secondary | ICD-10-CM | POA: Diagnosis present

## 2022-08-14 DIAGNOSIS — D729 Disorder of white blood cells, unspecified: Secondary | ICD-10-CM

## 2022-08-14 DIAGNOSIS — D75839 Thrombocytosis, unspecified: Secondary | ICD-10-CM

## 2022-08-14 DIAGNOSIS — D72828 Other elevated white blood cell count: Secondary | ICD-10-CM

## 2022-08-14 LAB — CBC WITH DIFFERENTIAL/PLATELET
Abs Immature Granulocytes: 0.03 10*3/uL (ref 0.00–0.07)
Basophils Absolute: 0.1 10*3/uL (ref 0.0–0.1)
Basophils Relative: 1 %
Eosinophils Absolute: 0.5 10*3/uL (ref 0.0–0.5)
Eosinophils Relative: 6 %
HCT: 34.4 % — ABNORMAL LOW (ref 36.0–46.0)
Hemoglobin: 11.4 g/dL — ABNORMAL LOW (ref 12.0–15.0)
Immature Granulocytes: 0 %
Lymphocytes Relative: 29 %
Lymphs Abs: 2.6 10*3/uL (ref 0.7–4.0)
MCH: 30.9 pg (ref 26.0–34.0)
MCHC: 33.1 g/dL (ref 30.0–36.0)
MCV: 93.2 fL (ref 80.0–100.0)
Monocytes Absolute: 0.7 10*3/uL (ref 0.1–1.0)
Monocytes Relative: 8 %
Neutro Abs: 5.2 10*3/uL (ref 1.7–7.7)
Neutrophils Relative %: 56 %
Platelets: 369 10*3/uL (ref 150–400)
RBC: 3.69 MIL/uL — ABNORMAL LOW (ref 3.87–5.11)
RDW: 13.1 % (ref 11.5–15.5)
WBC: 9.2 10*3/uL (ref 4.0–10.5)
nRBC: 0 % (ref 0.0–0.2)

## 2022-08-14 NOTE — Telephone Encounter (Signed)
PA for Trulicity 6.8/9.3WM has been approved

## 2022-09-11 DIAGNOSIS — M792 Neuralgia and neuritis, unspecified: Secondary | ICD-10-CM | POA: Diagnosis not present

## 2022-09-11 DIAGNOSIS — G893 Neoplasm related pain (acute) (chronic): Secondary | ICD-10-CM | POA: Diagnosis not present

## 2022-09-11 DIAGNOSIS — M542 Cervicalgia: Secondary | ICD-10-CM | POA: Diagnosis not present

## 2022-09-11 DIAGNOSIS — G894 Chronic pain syndrome: Secondary | ICD-10-CM | POA: Diagnosis not present

## 2022-09-11 DIAGNOSIS — M5442 Lumbago with sciatica, left side: Secondary | ICD-10-CM | POA: Diagnosis not present

## 2022-09-15 DIAGNOSIS — H26492 Other secondary cataract, left eye: Secondary | ICD-10-CM | POA: Diagnosis not present

## 2022-09-21 NOTE — Patient Instructions (Incomplete)
IMAGING LOCATION: [address] [place], [place] [address] [phone]   Diabetes Mellitus Basics  Diabetes mellitus, or diabetes, is a long-term (chronic) disease. It occurs when the body does not properly use sugar (glucose) that is released from food after you eat. Diabetes mellitus may be caused by one or both of these problems: Your pancreas does not make enough of a hormone called insulin. Your body does not react in a normal way to the insulin that it makes. Insulin lets glucose enter cells in your body. This gives you energy. If you have diabetes, glucose cannot get into cells. This causes high blood glucose (hyperglycemia). How to treat and manage diabetes You may need to take insulin or other diabetes medicines daily to keep your glucose in balance. If you are prescribed insulin, you will learn how to give yourself insulin by injection. You may need to adjust the amount of insulin you take based on the foods that you eat. You will need to check your blood glucose levels using a glucose monitor as told by your health care provider. The readings can help determine if you have low or high blood glucose. Generally, you should have these blood glucose levels: Before meals (preprandial): 80-130 mg/dL (4.4-7.2 mmol/L). After meals (postprandial): below 180 mg/dL (10 mmol/L). Hemoglobin A1c (HbA1c) level: less than 7%. Your health care provider will set treatment goals for you. Keep all follow-up visits. This is important. Follow these instructions at home: Diabetes medicines Take your diabetes medicines every day as told by your health care provider. List your diabetes medicines here: Name of medicine: ______________________________ Amount (dose): _______________ Time (a.m./p.m.): _______________ Notes: ___________________________________ Name of medicine: ______________________________ Amount (dose): _______________ Time (a.m./p.m.): _______________ Notes:  ___________________________________ Name of medicine: ______________________________ Amount (dose): _______________ Time (a.m./p.m.): _______________ Notes: ___________________________________ Insulin If you use insulin, list the types of insulin you use here: Insulin type: ______________________________ Amount (dose): _______________ Time (a.m./p.m.): _______________Notes: ___________________________________ Insulin type: ______________________________ Amount (dose): _______________ Time (a.m./p.m.): _______________ Notes: ___________________________________ Insulin type: ______________________________ Amount (dose): _______________ Time (a.m./p.m.): _______________ Notes: ___________________________________ Insulin type: ______________________________ Amount (dose): _______________ Time (a.m./p.m.): _______________ Notes: ___________________________________ Insulin type: ______________________________ Amount (dose): _______________ Time (a.m./p.m.): _______________ Notes: ___________________________________ Managing blood glucose  Check your blood glucose levels using a glucose monitor as told by your health care provider. Write down the times that you check your glucose levels here: Time: _______________ Notes: ___________________________________ Time: _______________ Notes: ___________________________________ Time: _______________ Notes: ___________________________________ Time: _______________ Notes: ___________________________________ Time: _______________ Notes: ___________________________________ Time: _______________ Notes: ___________________________________  Low blood glucose Low blood glucose (hypoglycemia) is when glucose is at or below 70 mg/dL (3.9 mmol/L). Symptoms may include: Feeling: Hungry. Sweaty and clammy. Irritable or easily upset. Dizzy. Sleepy. Having: A fast heartbeat. A headache. A change in your vision. Numbness around the mouth, lips, or  tongue. Having trouble with: Moving (coordination). Sleeping. Treating low blood glucose To treat low blood glucose, eat or drink something containing sugar right away. If you can think clearly and swallow safely, follow the 15:15 rule: Take 15 grams of a fast-acting carb (carbohydrate), as told by your health care provider. Some fast-acting carbs are: Glucose tablets: take 3-4 tablets. Hard candy: eat 3-5 pieces. Fruit juice: drink 4 oz (120 mL). Regular (not diet) soda: drink 4-6 oz (120-180 mL). Honey or sugar: eat 1 Tbsp (15 mL). Check your blood glucose levels 15 minutes after you take the carb. If your glucose is still at or below 70 mg/dL (3.9 mmol/L), take 15 grams of a  carb again. If your glucose does not go above 70 mg/dL (3.9 mmol/L) after 3 tries, get help right away. After your glucose goes back to normal, eat a meal or a snack within 1 hour. Treating very low blood glucose If your glucose is at or below 54 mg/dL (3 mmol/L), you have very low blood glucose (severe hypoglycemia). This is an emergency. Do not wait to see if the symptoms will go away. Get medical help right away. Call your local emergency services (911 in the [place] Do not drive yourself to the hospital. Questions to ask your health care provider Should I talk with a diabetes educator? What equipment will I need to care for myself at home? What diabetes medicines do I need? When should I take them? How often do I need to check my blood glucose levels? What number can I call if I have questions? When is my follow-up visit? Where can I find a support group for people with diabetes? Where to find more information American Diabetes Association: www.diabetes.org Association of Diabetes Care and Education Specialists: www.diabeteseducator.org Contact a health care provider if: Your blood glucose is at or above 240 mg/dL (13.3 mmol/L) for 2 days in a row. You have been sick or have had a fever for 2 days or more,  and you are not getting better. You have any of these problems for more than 6 hours: You cannot eat or drink. You feel nauseous. You vomit. You have diarrhea. Get help right away if: Your blood glucose is lower than 54 mg/dL (3 mmol/L). You get confused. You have trouble thinking clearly. You have trouble breathing. These symptoms may represent a serious problem that is an emergency. Do not wait to see if the symptoms will go away. Get medical help right away. Call your local emergency services (911 in the [place] Do not drive yourself to the hospital. Summary Diabetes mellitus is a chronic disease that occurs when the body does not properly use sugar (glucose) that is released from food after you eat. Take insulin and diabetes medicines as told. Check your blood glucose every day, as often as told. Keep all follow-up visits. This is important. This information is not intended to replace advice given to you by your health care provider. Make sure you discuss any questions you have with your health care provider. Document Revised: 11/22/2019 Document Reviewed: 11/22/2019 Elsevier Patient Education  [place].

## 2022-09-24 ENCOUNTER — Ambulatory Visit
Admission: RE | Admit: 2022-09-24 | Discharge: 2022-09-24 | Disposition: A | Discharge status: Home / Self Care | Payer: Medicare HMO | Source: Ambulatory Visit | Attending: Nurse Practitioner | Admitting: Nurse Practitioner

## 2022-09-24 ENCOUNTER — Ambulatory Visit (INDEPENDENT_AMBULATORY_CARE_PROVIDER_SITE_OTHER): Payer: Medicare HMO | Admitting: Nurse Practitioner

## 2022-09-24 ENCOUNTER — Encounter: Payer: Self-pay | Admitting: Nurse Practitioner

## 2022-09-24 ENCOUNTER — Encounter: Payer: Self-pay | Admitting: Oncology

## 2022-09-24 ENCOUNTER — Ambulatory Visit
Admission: RE | Admit: 2022-09-24 | Discharge: 2022-09-24 | Disposition: A | Discharge status: Home / Self Care | Payer: Medicare HMO | Attending: Nurse Practitioner | Admitting: Nurse Practitioner

## 2022-09-24 VITALS — BP 130/74 | HR 80 | Temp 98.1°F | Ht 58.5 in | Wt 146.9 lb

## 2022-09-24 DIAGNOSIS — M25552 Pain in left hip: Secondary | ICD-10-CM | POA: Insufficient documentation

## 2022-09-24 DIAGNOSIS — E1140 Type 2 diabetes mellitus with diabetic neuropathy, unspecified: Secondary | ICD-10-CM | POA: Diagnosis not present

## 2022-09-24 DIAGNOSIS — F324 Major depressive disorder, single episode, in partial remission: Secondary | ICD-10-CM

## 2022-09-24 DIAGNOSIS — E1159 Type 2 diabetes mellitus with other circulatory complications: Secondary | ICD-10-CM | POA: Diagnosis not present

## 2022-09-24 DIAGNOSIS — K219 Gastro-esophageal reflux disease without esophagitis: Secondary | ICD-10-CM

## 2022-09-24 DIAGNOSIS — I700 Atherosclerosis of aorta: Secondary | ICD-10-CM

## 2022-09-24 DIAGNOSIS — N1831 Chronic kidney disease, stage 3a: Secondary | ICD-10-CM | POA: Diagnosis not present

## 2022-09-24 DIAGNOSIS — E538 Deficiency of other specified B group vitamins: Secondary | ICD-10-CM

## 2022-09-24 DIAGNOSIS — E1169 Type 2 diabetes mellitus with other specified complication: Secondary | ICD-10-CM | POA: Diagnosis not present

## 2022-09-24 DIAGNOSIS — I152 Hypertension secondary to endocrine disorders: Secondary | ICD-10-CM | POA: Diagnosis not present

## 2022-09-24 DIAGNOSIS — E66811 Obesity, class 1: Secondary | ICD-10-CM

## 2022-09-24 DIAGNOSIS — G4733 Obstructive sleep apnea (adult) (pediatric): Secondary | ICD-10-CM

## 2022-09-24 DIAGNOSIS — I5022 Chronic systolic (congestive) heart failure: Secondary | ICD-10-CM | POA: Diagnosis not present

## 2022-09-24 DIAGNOSIS — E785 Hyperlipidemia, unspecified: Secondary | ICD-10-CM

## 2022-09-24 DIAGNOSIS — E119 Type 2 diabetes mellitus without complications: Secondary | ICD-10-CM

## 2022-09-24 DIAGNOSIS — Z794 Long term (current) use of insulin: Secondary | ICD-10-CM

## 2022-09-24 DIAGNOSIS — I5181 Takotsubo syndrome: Secondary | ICD-10-CM

## 2022-09-24 DIAGNOSIS — Z6830 Body mass index (BMI) 30.0-30.9, adult: Secondary | ICD-10-CM

## 2022-09-24 DIAGNOSIS — E559 Vitamin D deficiency, unspecified: Secondary | ICD-10-CM

## 2022-09-24 DIAGNOSIS — R102 Pelvic and perineal pain: Secondary | ICD-10-CM | POA: Diagnosis not present

## 2022-09-24 DIAGNOSIS — I4891 Unspecified atrial fibrillation: Secondary | ICD-10-CM

## 2022-09-24 DIAGNOSIS — E6609 Other obesity due to excess calories: Secondary | ICD-10-CM

## 2022-09-24 DIAGNOSIS — J449 Chronic obstructive pulmonary disease, unspecified: Secondary | ICD-10-CM

## 2022-09-24 DIAGNOSIS — Z978 Presence of other specified devices: Secondary | ICD-10-CM

## 2022-09-24 DIAGNOSIS — G894 Chronic pain syndrome: Secondary | ICD-10-CM

## 2022-09-24 MED ORDER — CYANOCOBALAMIN 1000 MCG/ML IJ SOLN
1000.0000 ug | INTRAMUSCULAR | Status: DC
  Administered 2022-09-24 – 2022-11-03 (×2): 1000 ug via INTRAMUSCULAR

## 2022-09-24 MED ORDER — TRELEGY ELLIPTA 100-62.5-25 MCG/ACT IN AEPB: 1.0000 | INHALATION_SPRAY | Freq: Every day | RESPIRATORY_TRACT | 4 refills | Status: DC

## 2022-09-24 NOTE — Assessment & Plan Note (Signed)
Chronic, stable.  BP at goal on office check today.  Recommend she continue to monitor BP at home regularly.  Focus on DASH diet.  Continue current medication regimen and adjust as needed, refills sent as needed.  LABS: CBC, TSH, CMP, urine ALB today.  Return in 3 months.

## 2022-09-24 NOTE — Assessment & Plan Note (Signed)
Chronic, ongoing.  Denies SI/HI.  Mood well-controlled.  Continue current medication regimen and adjust as needed.

## 2022-09-24 NOTE — Assessment & Plan Note (Signed)
Chronic.  Poor tolerance of CPAP mask, uses O2 night 2 L [place].

## 2022-09-24 NOTE — Assessment & Plan Note (Signed)
BMI 30.18.  Recommended eating smaller high protein, low fat meals more frequently and exercising 30 mins a day 5 times a week with a goal of 10-15lb weight loss in the next 3 months. Patient voiced their understanding and motivation to adhere to these recommendations.

## 2022-09-24 NOTE — Assessment & Plan Note (Signed)
Chronic, ongoing. Continue Protonix daily and adjust as needed.  Plan on Mag level today.  Risks of PPI use were discussed with patient including bone loss, C. Diff diarrhea, pneumonia, infections, CKD, electrolyte abnormalities.  Verbalizes understanding and chooses to continue the medication.

## 2022-09-24 NOTE — Assessment & Plan Note (Signed)
Chronic, ongoing with most recent EF 40-45% (10/21/21), followed by cardiology.  Euvolemic today.  Continue current medication regimen and collaboration with cardiology.  Recommend: - Reminded to call for an overnight weight gain of >2 pounds or a weekly weight gain of >5 pounds - not adding salt to food and read food labels. Reviewed the importance of keeping daily sodium intake to <2044m daily - Avoid Ibuprofen products

## 2022-09-24 NOTE — Assessment & Plan Note (Signed)
Chronic, stable. Minimal use of Albuterol. Continue Trelegy which offers benefit of medication minimization and has benefited symptoms.  Continue to collaborate with [name], recent notes reviewed.  No current daytime O2, at night only.

## 2022-09-24 NOTE — Assessment & Plan Note (Signed)
Chronic, stable.  Recheck Vitamin D next visit and adjust supplement as needed.

## 2022-09-24 NOTE — Assessment & Plan Note (Signed)
Chronic, ongoing, insulin dependent. A1c 7.6% November, recheck today with urine ALB.  - Will continue Metformin at max dosing, Trulicity 4.5 MG weekly, and Tresiba 50 units, increase 3 units in 3 days if fasting sugar not less then 130 consistently recommended.  Reviewed CCM team pharmacist note for further recommendations.  May need meal time insulin if ongoing elevations. - Recommend she continue to monitor BS consistently at home and document + focus heavily on diet changes.  She is aware to notify provider if fasting BS >130 consistently or <70, as may need to adjust insulin further.   - In past poorly tolerated Jardiance and Mounjaro -- discussed with her that and how SGLT2 would benefit HF -- however concern for side effects.   - Would benefit a [employer], may need to work on coverage for this.  - Return in 3 months.

## 2022-09-24 NOTE — Assessment & Plan Note (Signed)
Chronic, stable.  Continue current medication regimen and adjust as needed.  Lipid panel today.

## 2022-09-24 NOTE — Assessment & Plan Note (Signed)
Ongoing and stable.  Continue Losartan at low dose for heart and kidney protection.  Check CMP and urine ALB today.  Will refer to nephrology if any worsening in future.

## 2022-09-24 NOTE — Assessment & Plan Note (Signed)
Refer to insulin dependent diabetes plan of care -- continue collaboration with pain management.

## 2022-09-24 NOTE — Progress Notes (Signed)
BP 130/74   Pulse 80   Temp 98.1 F (36.7 C) (Oral)   Ht 4' 10.5" (1.486 m)   Wt 146 lb 14.4 oz (66.6 kg)   LMP  (LMP Unknown)   SpO2 98%   BMI 30.18 kg/m    Subjective:    [name], female    DOB: February 13, 1951, 72 y.o.   MRN: [id number]  HPI: Jordan Chan is a 72 y.o. female  Chief Complaint  Patient presents with   COPD   Diabetes   RSV    Follow up from last visit    DIABETES Last A1c was 7.6% in November.  Continues on Metformin 123XX123 MG BID, Trulicity 4.5 MG weekly, and Tresiba 50 units at HS. Jardiance made her sick in past, and Mounjaro gave her severe diarrhea and abdominal pain.  Works with [employer] team and provider.   Continues on monthly B12 injections monthly for low levels.  Followed with hematology in past, last visit 02/11/22, received 5 iron infusions total -- continues on folic acid.  WBC remains stable, if becomes elevated (>20) and continues to elevate they will perform bone marrow biopsy.  Hypoglycemic episodes: none Polydipsia/polyuria: no Visual disturbance: no Chest pain: no Paresthesias: no Glucose Monitoring: yes  Accucheck frequency: BID  Fasting glucose: 120 to 180  Post prandial: 220 to 290 -- depends on what she eats  Evening:   Before meals: Taking Insulin?: yes  Long acting insulin: Tresiba 50 units  Short acting insulin: Blood Pressure Monitoring: occasional Retinal Examination: Up to Date -- [place] Exam: Up to Date Pneumovax: Up to Date Influenza: Up to Date Aspirin: yes   HYPERTENSION / HYPERLIPIDEMIA/CHF Had last visit with cardiology on 05/01/22. Continues on Atorvastatin 20 MG, Metoprolol 12.5 MG daily, Lasix 40 MG daily, Losartan 12.5 MG daily, + ASA.  Currently on Eliquis.   Echo 10/21/21 = EF 10/21/21 showed EF 40-45% with mildly decreased LV function.  Satisfied with current treatment? yes Duration of hypertension: chronic BP monitoring frequency: not currently BP range:  BP  medication side effects: no Duration of hyperlipidemia: chronic Cholesterol medication side effects: no Cholesterol supplements: none Medication compliance: good compliance Aspirin: yes Recent stressors: no Recurrent headaches: no Visual changes: no Palpitations: no Dyspnea: baseline, no worsening Chest pain: no Lower extremity edema: no Dizzy/lightheaded: no   ATRIAL FIBRILLATION Continues on Eliquis and Metoprolol. Atrial fibrillation status: stable Satisfied with current treatment: yes  Medication side effects:  no Medication compliance: good compliance Etiology of atrial fibrillation: unknown Palpitations:  no Chest pain:  no Dyspnea on exertion:  baseline, no worsening Orthopnea:  no Syncope:  no Edema:  no Ventricular rate control: B-blocker Anti-coagulation: long acting   CHRONIC KIDNEY DISEASE CKD status: stable Medications renally dose: yes Previous renal evaluation: no Pneumovax:  Up to Date Influenza Vaccine:  Up to Date   GERD Taking Protonix daily. History of low magnesium level at 1.5, but recent stable. GERD control status: stable Satisfied with current treatment? yes Heartburn frequency: no Medication side effects: no  Medication compliance: stable  Dysphagia: no Odynophagia:  no Hematemesis: no Blood in stool: no EGD: yes   COPD Visits with pulmonary ([name]) for COPD -- last saw 03/10/22.  Currently using Trelegy, Albuterol, and Singulair.  Had RSV on 07/13/22 -- she reports slowly recovering.  Has a history of smoking, quit 15 years ago. She does not use CPAP, but continues to use O2 at night 2 L ==  sleeps in recliner at baseline, has since 1998. COPD status: controlled Satisfied with current treatment?: yes Oxygen use: no Dyspnea frequency: baseline, no worsening Cough frequency: none Rescue inhaler frequency: very seldom Limitation of activity: no Productive cough: none Last Spirometry: with pulmonary Pneumovax: Up to  Date Influenza: Up to Date  CHRONIC PAIN: Followed by [facility] in [place], last seen 04/23/22 -- continues pain pump + Gabapentin.  Followed by clinic nurse for pump check and replacement. Reports pain is well-controlled with current regimen -- at baseline for her.  Vitamin D on low side past labs, taking supplement with recent levels stable.  No recent falls or fractures.  Normal DEXA 2017.  She currently has a pain to left pelvic area that radiates down into her left thigh at times.  She was recently moving from her home to a new home, lots of moving items.  Pain is intermittent, if she moves or walks it gets worse.  At worst pain is 7-8/10, her current pain pump is not helping this.  Has tried Tylenol, heat, ice.  Nothing is helping the pain.  No history of hip replacement.  DEPRESSION Continues on Effexor 150 MG daily + Buspar 5 MG BID.  Has home to live in now[place].  Mood improving. Mood status: stable Satisfied with current treatment?: yes Symptom severity: mild  Duration of current treatment : chronic Side effects: no Medication compliance: good compliance Psychotherapy/counseling: has gone in the past Depressed mood: yes Anxious mood: once in awhile Anhedonia: no Significant weight loss or gain: no Insomnia: none Fatigue: no Feelings of worthlessness or guilt: no Impaired concentration/indecisiveness: no Suicidal ideations: no Hopelessness: no Crying spells: occasional    09/24/2022    8:45 AM 07/24/2022   10:24 AM 06/24/2022    8:21 AM 05/20/2022   10:40 AM 03/24/2022    9:28 AM  Depression screen PHQ 2/9  Decreased Interest 0 2 0 1 0  Down, Depressed, Hopeless 1 2 3 3 2  $ PHQ - 2 Score 1 4 3 4 2  $ Altered sleeping 2 3 2 3 2  $ Tired, decreased energy 3 2 3 2 3  $ Change in appetite 1 2 1 1 1  $ Feeling bad or failure about yourself  0 0 0 0 0  Trouble concentrating 0 1 1 1 $ 0  Moving slowly or fidgety/restless 0 0 0 0 0  Suicidal thoughts 0  0 0 0 0  PHQ-9 Score 7 12 10 11 8  $ Difficult doing work/chores Not difficult at all Somewhat difficult Not difficult at all Not difficult at all Not difficult at all      09/24/2022    8:45 AM 07/24/2022   10:24 AM 06/24/2022    8:22 AM 05/20/2022   10:40 AM  GAD 7 : Generalized Anxiety Score  Nervous, Anxious, on Edge 1 2 3 3  $ Control/stop worrying 0 1 3 3  $ Worry too much - different things 0 1 2 3  $ Trouble relaxing 1 2 2 2  $ Restless 0 0 1 0  Easily annoyed or irritable 0 1 1 1  $ Afraid - awful might happen 0 0 0 0  Total GAD 7 Score 2 7 12 12  $ Anxiety Difficulty  Not difficult at all Not difficult at all Not difficult at all     Relevant past medical, surgical, family and social history reviewed and updated as indicated. Interim medical history since our last visit reviewed. Allergies and medications reviewed and  updated.  Review of Systems  Constitutional:  Negative for activity change, appetite change, diaphoresis, fatigue and fever.  Respiratory:  Negative for cough, chest tightness and shortness of breath.   Cardiovascular:  Negative for chest pain, palpitations and leg swelling.  Gastrointestinal: Negative.   Endocrine: Negative for heat intolerance, polydipsia, polyphagia and polyuria.  Musculoskeletal:  Positive for arthralgias.  Neurological: Negative.   Psychiatric/Behavioral: Negative.     Per HPI unless specifically indicated above     Objective:    BP 130/74   Pulse 80   Temp 98.1 F (36.7 C) (Oral)   Ht 4' 10.5" (1.486 m)   Wt 146 lb 14.4 oz (66.6 kg)   LMP  (LMP Unknown)   SpO2 98%   BMI 30.18 kg/m   Wt Readings from Last 3 Encounters:  09/24/22 146 lb 14.4 oz (66.6 kg)  07/24/22 145 lb (65.8 kg)  07/13/22 149 lb 14.6 oz (68 kg)    Physical Exam Vitals and nursing note reviewed.  Constitutional:      General: She is awake. She is not in acute distress.    Appearance: She is well-developed and well-groomed. She is not ill-appearing.  HENT:      Head: Normocephalic.     Right Ear: Hearing normal.     Left Ear: Hearing normal.  Eyes:     General: Lids are normal.        Right eye: No discharge.        Left eye: No discharge.     Conjunctiva/sclera: Conjunctivae normal.     Pupils: Pupils are equal, round, and reactive to light.  Neck:     Thyroid: No thyromegaly.     Vascular: No carotid bruit.  Cardiovascular:     Rate and Rhythm: Normal rate and regular rhythm.     Heart sounds: Normal heart sounds. No murmur heard.    No gallop.  Pulmonary:     Effort: Pulmonary effort is normal. No accessory muscle usage or respiratory distress.     Breath sounds: Normal breath sounds. No wheezing or rhonchi.  Abdominal:     General: Bowel sounds are normal.     Palpations: Abdomen is soft.  Musculoskeletal:     Cervical back: Normal range of motion and neck supple.     Right hip: Normal.     Left hip: Tenderness and crepitus present. No bony tenderness. Decreased range of motion. Decreased strength.     Right lower leg: No edema.     Left lower leg: No edema.  Lymphadenopathy:     Cervical: No cervical adenopathy.  Skin:    General: Skin is warm and dry.  Neurological:     Mental Status: She is alert and oriented to person, place, and time.  Psychiatric:        Attention and Perception: Attention normal.        Mood and Affect: Mood normal.        Speech: Speech normal.        Behavior: Behavior normal. Behavior is cooperative.        Thought Content: Thought content normal.    Results for orders placed or performed in visit on 08/14/22  CBC with Differential  Result Value Ref Range   WBC 9.2 4.0 - 10.5 K/uL   RBC 3.69 (L) 3.87 - 5.11 MIL/uL   Hemoglobin 11.4 (L) 12.0 - 15.0 g/dL   HCT 34.4 (L) 36.0 - 46.0 %   MCV 93.2 80.0 - 100.0  fL   MCH 30.9 26.0 - 34.0 pg   MCHC 33.1 30.0 - 36.0 g/dL   RDW 13.1 11.5 - 15.5 %   Platelets 369 150 - 400 K/uL   nRBC 0.0 0.0 - 0.2 %   Neutrophils Relative % 56 %   Neutro Abs 5.2  1.7 - 7.7 K/uL   Lymphocytes Relative 29 %   Lymphs Abs 2.6 0.7 - 4.0 K/uL   Monocytes Relative 8 %   Monocytes Absolute 0.7 0.1 - 1.0 K/uL   Eosinophils Relative 6 %   Eosinophils Absolute 0.5 0.0 - 0.5 K/uL   Basophils Relative 1 %   Basophils Absolute 0.1 0.0 - 0.1 K/uL   Immature Granulocytes 0 %   Abs Immature Granulocytes 0.03 0.00 - 0.07 K/uL      Assessment & Plan:   Problem List Items Addressed This Visit       Cardiovascular and Mediastinum   Aortic atherosclerosis ([place])    Ongoing.  Noted on CT 12/11/19.  Continue daily statin.  Monitor closely for symptoms.      Relevant Orders   Comprehensive metabolic panel   Lipid Panel w/o Chol/HDL Ratio   Atrial fibrillation with RVR (HCC)    Chronic, stable.  Diagnosed on 09/11/21 with underlying PNA.  At this time continue current medication regimen as ordered by cardiology and collaboration with cardiology. Rate control present.      Relevant Orders   Comprehensive metabolic panel   Chronic HFrEF (heart failure with reduced ejection fraction) (HCC)    Chronic, ongoing with most recent EF 40-45% (10/21/21), followed by cardiology.  Euvolemic today.  Continue current medication regimen and collaboration with cardiology.  Recommend: - Reminded to call for an overnight weight gain of >2 pounds or a weekly weight gain of >5 pounds - not adding salt to food and read food labels. Reviewed the importance of keeping daily sodium intake to <2063m daily - Avoid Ibuprofen products      Relevant Orders   Comprehensive metabolic panel   Magnesium   Hypertension associated with type 2 diabetes mellitus (HCC)    Chronic, stable.  BP at goal on office check today.  Recommend she continue to monitor BP at home regularly.  Focus on DASH diet.  Continue current medication regimen and adjust as needed, refills sent as needed.  LABS: CBC, TSH, CMP, urine ALB today.  Return in 3 months.       Relevant Orders   TSH   CBC with  Differential/Platelet   Stress-induced cardiomyopathy     Respiratory   COPD, moderate (HCC)    Chronic, stable. Minimal use of Albuterol. Continue Trelegy which offers benefit of medication minimization and has benefited symptoms.  Continue to collaborate with Dr. FRaul Del recent notes reviewed.  No current daytime O2, at night only.        Relevant Medications   Fluticasone-Umeclidin-Vilant (TRELEGY ELLIPTA) 100-62.5-25 MCG/ACT AEPB   Other Relevant Orders   CBC with Differential/Platelet   Sleep apnea    Chronic.  Poor tolerance of CPAP mask, uses O2 night 2 L [place].        Digestive   GERD (gastroesophageal reflux disease)    Chronic, ongoing. Continue Protonix daily and adjust as needed.  Plan on Mag level today.  Risks of PPI use were discussed with patient including bone loss, C. Diff diarrhea, pneumonia, infections, CKD, electrolyte abnormalities.  Verbalizes understanding and chooses to continue the medication.  Endocrine   Hyperlipidemia associated with type 2 diabetes mellitus (HCC)    Chronic, stable.  Continue current medication regimen and adjust as needed.  Lipid panel today.      Relevant Orders   Comprehensive metabolic panel   Lipid Panel w/o Chol/HDL Ratio   Insulin dependent type 2 diabetes mellitus (HCC) - Primary    Chronic, ongoing, insulin dependent. A1c 7.6% November, recheck today with urine ALB.  - Will continue Metformin at max dosing, Trulicity 4.5 MG weekly, and Tresiba 50 units, increase 3 units in 3 days if fasting sugar not less then 130 consistently recommended.  Reviewed CCM team pharmacist note for further recommendations.  May need meal time insulin if ongoing elevations. - Recommend she continue to monitor BS consistently at home and document + focus heavily on diet changes.  She is aware to notify provider if fasting BS >130 consistently or <70, as may need to adjust insulin further.   - In past poorly tolerated Jardiance and Mounjaro --  discussed with her that and how SGLT2 would benefit HF -- however concern for side effects.   - Would benefit a [employer], may need to work on coverage for this.  - Return in 3 months.       Relevant Orders   HgB A1c   Urine Microalbumin w/creat. ratio   Type 2 diabetes, controlled, with neuropathy ([place])    Refer to insulin dependent diabetes plan of care -- continue collaboration with pain management.        Genitourinary   Chronic kidney disease, stage III (moderate) (HCC)    Ongoing and stable.  Continue Losartan at low dose for heart and kidney protection.  Check CMP and urine ALB today.  Will refer to nephrology if any worsening in future.        Other   Presence of intrathecal pump (Chronic)    Continue to collaborate with pain clinic in [facility] who monitors this.      B12 deficiency    Chronic, stable.  B12 level obtained today, continue injections monthly.      Relevant Medications   cyanocobalamin (VITAMIN B12) injection 1,000 mcg   Other Relevant Orders   Vitamin B12   CBC with Differential/Platelet   Chronic pain syndrome    Chronic.  Continue to collaborate with chronic pain clinic in [facility].  Has morphine pump.        Depression, major, single episode, in partial remission (HCC)    Chronic, ongoing.  Denies SI/HI.  Mood well-controlled.  Continue current medication regimen and adjust as needed.        Obesity    BMI 30.18.  Recommended eating smaller high protein, low fat meals more frequently and exercising 30 mins a day 5 times a week with a goal of 10-15lb weight loss in the next 3 months. Patient voiced their understanding and motivation to adhere to these recommendations.       Pelvic joint pain, left    Ongoing for 3 months, suspect some OA present.  Obtain imaging and may need referral to ortho or PT.  Discussed with patient.  Continue simple treatment at home.      Relevant Orders   DG HIP UNILAT W OR W/O PELVIS 2-3 VIEWS  LEFT   Vitamin D deficiency    Chronic, stable.  Recheck Vitamin D next visit and adjust supplement as needed.        Follow up plan: Return in about 3 months (  around 12/23/2022) for T2DM, HTN/HLD, MOOD, COPD, CHRONIC PAIN, HF, CKD.

## 2022-09-24 NOTE — Assessment & Plan Note (Signed)
Continue to collaborate with pain clinic in [facility] who monitors this.

## 2022-09-24 NOTE — Assessment & Plan Note (Signed)
Ongoing for 3 months, suspect some OA present.  Obtain imaging and may need referral to ortho or PT.  Discussed with patient.  Continue simple treatment at home.

## 2022-09-24 NOTE — Assessment & Plan Note (Signed)
Ongoing.  Noted on CT 12/11/19.  Continue daily statin.  Monitor closely for symptoms.

## 2022-09-24 NOTE — Assessment & Plan Note (Signed)
Chronic, stable.  B12 level obtained today, continue injections monthly.

## 2022-09-24 NOTE — Assessment & Plan Note (Signed)
Chronic, stable.  Diagnosed on 09/11/21 with underlying PNA.  At this time continue current medication regimen as ordered by cardiology and collaboration with cardiology. Rate control present.

## 2022-09-24 NOTE — Assessment & Plan Note (Signed)
Chronic.  Continue to collaborate with chronic pain clinic in [facility].  Has morphine pump.

## 2022-09-25 LAB — VITAMIN B12: Vitamin B-12: >2000 pg/mL — ABNORMAL HIGH (ref 232–1245)

## 2022-09-25 LAB — CBC WITH DIFFERENTIAL/PLATELET
Basophils Absolute: 0.1 10*3/uL (ref 0.0–0.2)
Basos: 1 %
EOS (ABSOLUTE): 0.3 10*3/uL (ref 0.0–0.4)
Eos: 4 %
Hematocrit: 36.6 % (ref 34.0–46.6)
Hemoglobin: 11.9 g/dL (ref 11.1–15.9)
Immature Grans (Abs): 0 10*3/uL (ref 0.0–0.1)
Immature Granulocytes: 0 %
Lymphocytes Absolute: 2.5 10*3/uL (ref 0.7–3.1)
Lymphs: 27 %
MCH: 30.4 pg (ref 26.6–33.0)
MCHC: 32.5 g/dL (ref 31.5–35.7)
MCV: 94 fL (ref 79–97)
Monocytes Absolute: 0.7 10*3/uL (ref 0.1–0.9)
Monocytes: 8 %
Neutrophils Absolute: 5.8 10*3/uL (ref 1.4–7.0)
Neutrophils: 60 %
Platelets: 405 10*3/uL (ref 150–450)
RBC: 3.91 x10E6/uL (ref 3.77–5.28)
RDW: 12.6 % (ref 11.7–15.4)
WBC: 9.4 10*3/uL (ref 3.4–10.8)

## 2022-09-25 LAB — COMPREHENSIVE METABOLIC PANEL
ALT: 11 IU/L (ref 0–32)
AST: 15 IU/L (ref 0–40)
Albumin/Globulin Ratio: 1.4 (ref 1.2–2.2)
Albumin: 4.2 g/dL (ref 3.8–4.8)
Alkaline Phosphatase: 89 IU/L (ref 44–121)
BUN/Creatinine Ratio: 12 (ref 12–28)
BUN: 14 mg/dL (ref 8–27)
Bilirubin Total: <0.2 mg/dL (ref 0.0–1.2)
CO2: 27 mmol/L (ref 20–29)
Calcium: 9.9 mg/dL (ref 8.7–10.3)
Chloride: 96 mmol/L (ref 96–106)
Creatinine, Ser: 1.17 mg/dL — ABNORMAL HIGH (ref 0.57–1.00)
Globulin, Total: 3.1 g/dL (ref 1.5–4.5)
Glucose: 163 mg/dL — ABNORMAL HIGH (ref 70–99)
Potassium: 4.8 mmol/L (ref 3.5–5.2)
Sodium: 138 mmol/L (ref 134–144)
Total Protein: 7.3 g/dL (ref 6.0–8.5)
eGFR: 50 mL/min/{1.73_m2} — ABNORMAL LOW (ref >59)

## 2022-09-25 LAB — LIPID PANEL W/O CHOL/HDL RATIO
Cholesterol, Total: 137 mg/dL (ref 100–199)
HDL: 63 mg/dL (ref >39)
LDL Chol Calc (NIH): 56 mg/dL (ref 0–99)
Triglycerides: 100 mg/dL (ref 0–149)
VLDL Cholesterol Cal: 18 mg/dL (ref 5–40)

## 2022-09-25 LAB — HEMOGLOBIN A1C
Est. average glucose Bld gHb Est-mCnc: 197 mg/dL
Hgb A1c MFr Bld: 8.5 % — ABNORMAL HIGH (ref 4.8–5.6)

## 2022-09-25 LAB — TSH: TSH: 3.85 u[IU]/mL (ref 0.450–4.500)

## 2022-09-25 LAB — MAGNESIUM: Magnesium: 1.9 mg/dL (ref 1.6–2.3)

## 2022-09-25 NOTE — Progress Notes (Signed)
Good morning, please call Dulcie and let her know labs have returned: - A1c trended up to 8.5% from 7.6% -- please increase insulin to 55 units at night and if ongoing elevations next visit we may have to consider adding on meal time insulin.  Focus heavily on diet changes at home.   - Kidneys continue to show some mild chronic kidney disease stage 3a, we will continue to monitor this closely, getting diabetes under control can help.  Liver function normal. - Remainder of your labs are stable.  Please ensure you get your hip imaging.  Any questions? Keep being amazing!!  Thank you for allowing me to participate in your care.  I appreciate you. Kindest regards, Shine Mikes

## 2022-09-26 ENCOUNTER — Other Ambulatory Visit: Payer: Self-pay

## 2022-09-26 LAB — MICROALBUMIN / CREATININE URINE RATIO
Creatinine, Urine: 83.1 mg/dL
Microalb/Creat Ratio: 4 mg/g creat (ref 0–29)
Microalbumin, Urine: 3.4 ug/mL

## 2022-09-26 MED ORDER — TRESIBA FLEXTOUCH 100 UNIT/ML ~~LOC~~ SOPN: 55.0000 [IU] | PEN_INJECTOR | Freq: Every day | SUBCUTANEOUS | 4 refills | Status: DC

## 2022-09-26 NOTE — Progress Notes (Signed)
Good morning, please let Jordan Chan know her imaging has returned.  There is significant degenerative changes to the left hip, I suspect this is causing your severe pain. I recommend we get you into ortho to discuss options for this and treatment.  Are you okay with this?  If so I will place referral to ortho, do you have a preference on where you would like to go? Keep being awesome!!  Thank you for allowing me to participate in your care.  I appreciate you. Kindest regards, Eren Puebla

## 2022-09-26 NOTE — Addendum Note (Signed)
Addended by: [name] on: 09/26/2022 03:17 PM   Modules accepted: Orders

## 2022-09-29 ENCOUNTER — Telehealth: Payer: Self-pay

## 2022-09-29 NOTE — Telephone Encounter (Signed)
Patient was called and made aware copy is at front desk for pick up

## 2022-09-29 NOTE — Telephone Encounter (Signed)
Copied from [place] [phone]. Topic: General - Inquiry >> Sep 29, 2022  8:50 AM Marcellus Scott wrote: Reason for CRM: Pt stated [facility] is requesting a copy of the recent x-ray. She stated that if she had another one done this soon, insurance would not cover it.  She stated that her appointment with [facility] is tomorrow morning.    Please advise.

## 2022-09-29 NOTE — Telephone Encounter (Signed)
Copies are at the front desk ready for pick up

## 2022-09-30 DIAGNOSIS — M1612 Unilateral primary osteoarthritis, left hip: Secondary | ICD-10-CM | POA: Diagnosis not present

## 2022-10-03 ENCOUNTER — Other Ambulatory Visit: Payer: Self-pay | Admitting: Nurse Practitioner

## 2022-10-03 NOTE — Telephone Encounter (Signed)
Requested Prescriptions  Pending Prescriptions Disp Refills   Accu-Chek Softclix Lancets lancets [Pharmacy Med Name: ACCU-CHEK SOFTCLIX LANCETS] 300 each 3    Sig: TEST BLOOD SUGAR THREE TIMES DAILY     Endocrinology: Diabetes - Testing Supplies Passed - 10/03/2022  2:19 AM      Passed - Valid encounter within last 12 months    Recent Outpatient Visits           1 week ago Insulin dependent type 2 diabetes mellitus ([place])   [place], [place] T, NP   2 months ago COPD with acute exacerbation ([place])   [place], Erin E, PA-C   3 months ago Insulin dependent type 2 diabetes mellitus ([place])   [place], Burns T, NP   4 months ago Depression, major, single episode, in partial remission ([place])   [place], Estelle T, NP   6 months ago Insulin dependent type 2 diabetes mellitus ([place])   [place], [name], NP       Future Appointments             In 2 weeks [place], Harrell Gave, MD [place] at [place]   In 2 months [name], [name], NP [place], [place]

## 2022-10-06 ENCOUNTER — Telehealth: Payer: Self-pay | Admitting: Nurse Practitioner

## 2022-10-06 NOTE — Telephone Encounter (Unsigned)
Copied from [place] [id number]. Topic: General - Other >> Oct 06, 2022  2:49 PM [place] B wrote: Reason for CRM: Patient called in states she is having hsp surgery, but Ortho wants to inject her and she was told this can spike her sugar  level up she wants to know if she can get fast acting insulin. She is having this next Wednesday.

## 2022-10-07 NOTE — Telephone Encounter (Signed)
Patient made aware of Provider's recommendations and verbalized understanding.

## 2022-10-09 DIAGNOSIS — J449 Chronic obstructive pulmonary disease, unspecified: Secondary | ICD-10-CM | POA: Diagnosis not present

## 2022-10-09 DIAGNOSIS — Z01818 Encounter for other preprocedural examination: Secondary | ICD-10-CM | POA: Diagnosis not present

## 2022-10-09 DIAGNOSIS — G4734 Idiopathic sleep related nonobstructive alveolar hypoventilation: Secondary | ICD-10-CM | POA: Diagnosis not present

## 2022-10-15 DIAGNOSIS — M1612 Unilateral primary osteoarthritis, left hip: Secondary | ICD-10-CM | POA: Diagnosis not present

## 2022-10-22 ENCOUNTER — Encounter: Payer: Self-pay | Admitting: Internal Medicine

## 2022-10-22 ENCOUNTER — Ambulatory Visit: Payer: Medicare HMO | Attending: Internal Medicine

## 2022-10-22 ENCOUNTER — Ambulatory Visit: Payer: Medicare HMO | Attending: Internal Medicine | Admitting: Internal Medicine

## 2022-10-22 VITALS — BP 106/70 | HR 80 | Ht 58.5 in | Wt 150.0 lb

## 2022-10-22 DIAGNOSIS — Z01810 Encounter for preprocedural cardiovascular examination: Secondary | ICD-10-CM | POA: Diagnosis not present

## 2022-10-22 DIAGNOSIS — E1169 Type 2 diabetes mellitus with other specified complication: Secondary | ICD-10-CM | POA: Diagnosis not present

## 2022-10-22 DIAGNOSIS — I10 Essential (primary) hypertension: Secondary | ICD-10-CM

## 2022-10-22 DIAGNOSIS — I5020 Unspecified systolic (congestive) heart failure: Secondary | ICD-10-CM

## 2022-10-22 DIAGNOSIS — I4819 Other persistent atrial fibrillation: Secondary | ICD-10-CM | POA: Diagnosis not present

## 2022-10-22 DIAGNOSIS — I428 Other cardiomyopathies: Secondary | ICD-10-CM

## 2022-10-22 DIAGNOSIS — E785 Hyperlipidemia, unspecified: Secondary | ICD-10-CM | POA: Diagnosis not present

## 2022-10-22 LAB — ECHOCARDIOGRAM COMPLETE
AR max vel: 1.35 cm2
AV Area VTI: 1.38 cm2
AV Area mean vel: 1.18 cm2
AV Mean grad: 5 mmHg
AV Peak grad: 8.8 mmHg
Ao pk vel: 1.48 m/s
Area-P 1/2: 7.61 cm2
Height: 58.5 in
S' Lateral: 3.4 cm
Weight: 2400 oz

## 2022-10-22 MED ORDER — FUROSEMIDE 40 MG PO TABS: 20.0000 mg | ORAL_TABLET | Freq: Every day | ORAL | 3 refills | Status: DC

## 2022-10-22 NOTE — Patient Instructions (Signed)
Medication Instructions:  Your physician recommends the following medication changes.  DECREASE: Lasix to 20 mg by mouth daily  *If you need a refill on your cardiac medications before your next appointment, please call your pharmacy*   Lab Work: None ordered today   Testing/Procedures: Your physician has requested that you have an echocardiogram. Echocardiography is a painless test that uses sound waves to create images of your heart. It provides your doctor with information about the size and shape of your heart and how well your heart's chambers and valves are working.   You may receive an ultrasound enhancing agent through an IV if needed to better visualize your heart during the echo. This procedure takes approximately one hour.  There are no restrictions for this procedure.  This will take place at [facility] ([place] [id number], [place]    Follow-Up: At [facility], you and your health needs are our priority.  As part of our continuing mission to provide you with exceptional heart care, we have created designated Provider Care Teams.  These Care Teams include your primary Cardiologist (physician) and Advanced Practice Providers (APPs -  Physician Assistants and Nurse Practitioners) who all work together to provide you with the care you need, when you need it.  We recommend signing up for the patient portal called "MyChart".  Sign up information is provided on this After Visit Summary.  MyChart is used to connect with patients for Virtual Visits (Telemedicine).  Patients are able to view lab/test results, encounter notes, upcoming appointments, etc.  Non-urgent messages can be sent to your provider as well.   To learn more about what you can do with MyChart, go to [email or web address].    Your next appointment:   6 month(s)  Provider:   You may see [name], MD or one of the following Advanced Practice Providers on your designated  Care Team:   Murray Hodgkins, NP [name], PA-C Cadence Kathlen Mody, PA-C Gerrie Nordmann, NP

## 2022-10-22 NOTE — Progress Notes (Signed)
Follow-up Outpatient Visit Date: 10/22/2022  Primary Care Provider: [name], NP [place] [address]  Chief Complaint: Follow-up cardiomyopathy and atrial fibrillation  HPI:  Ms. Abdella is a 72 y.o. female with history of paroxysmal atrial fibrillation, stress-induced cardiomyopathy in 04/2017 in the setting of pain pump malfunction and opioid withdrawal, PE following hospitalization for pneumonia (summer 2021), chronic low back pain, diabetes mellitus, COPD, GERD, sleep apnea, and IBS, who presents for follow-up of atrial fibrillation and stress-induced cardiomyopathy.  I last saw her in 04/2022, at which time she was feeling well from a heart standpoint.  We did not make any medication changes or pursue additional testing.  She was hospitalized in 07/2022 with RSV and COPD exacerbation.  Today, Ms. Stull reports that she has been doing well from a heart standpoint.  She has chronic exertional dyspnea that she attributes to her COPD.  She denies chest pain, palpitations, lightheadedness, and edema.  Her main complaint is of pain in the left hip.  She was told at one point that she may need a hip replacement.  She received an injection into the hip last week and noted relief of her pain for only 4 to 5 days.  She is concerned that her diagnosis of CHF could prevent her from receiving a hip replacement.  Chronic back pain is well-controlled with pain pump.  Home blood pressures are typically in the 120s over 50s.  --------------------------------------------------------------------------------------------------  Past Medical History:  Diagnosis Date   Arthritis    Asthma    Atrial fibrillation ([place])    Breast cancer ([place]) 1998   right breast ca with mastectomy and chemotherapy and radiation   Bronchitis    CHF (congestive heart failure) ([place])    "with Morphine withdrawal"   COPD (chronic obstructive pulmonary disease) ([place])    Diabetes mellitus without  complication ([place])    Diverticulitis    diverticulosis also   Dyspnea    Endometriosis    GERD (gastroesophageal reflux disease)    History of shingles 2000-2005   Hypercholesteremia    Hypertension    IBS (irritable bowel syndrome)    Low back pain    a. Implanted morphine/bupivicaine/clonidine pump.   Neuropathy    Orthopnea    Oxygen dependent    uses at night   Personal history of chemotherapy    Personal history of radiation therapy    Pneumonia    pneumonia 5-6 times, history of bronchitis also   Scoliosis    Sleep apnea    does not use cpap   Stroke ([place]) 2010   TIA, 10 years ago   Withdrawal from sedative drug ([place])    withdrawal from morphine when pump batteries died   Past Surgical History:  Procedure Laterality Date   ABDOMINAL HYSTERECTOMY  1987   BACK SURGERY     Tailbone removed following fracture   BREAST SURGERY Right    mastectomy   CARDIAC CATHETERIZATION     CATARACT EXTRACTION W/PHACO Right 05/19/2019   Procedure: CATARACT EXTRACTION PHACO AND INTRAOCULAR LENS PLACEMENT ([place]), RIGHT, DIABETIC;  Surgeon: Marchia Meiers, MD;  Location: [facility] ORS;  Service: Ophthalmology;  Laterality: Right;  Lot # [id number] H [place]: 00:43.8 CDE: 4.59   CATARACT EXTRACTION W/PHACO Left 06/16/2019   Procedure: CATARACT EXTRACTION PHACO AND INTRAOCULAR LENS PLACEMENT ([place]) LEFT VISION BLUE DIABETIC;  Surgeon: Marchia Meiers, MD;  Location: [facility] ORS;  Service: Ophthalmology;  Laterality: Left;  Lot [id number] H [place]: 00:46.9 CDE: 6.53  COCCYX REMOVAL     COLONOSCOPY WITH PROPOFOL N/A 01/01/2017   Procedure: COLONOSCOPY WITH PROPOFOL;  Surgeon: Jonathon Bellows, MD;  Location: [facility] ENDOSCOPY;  Service: Endoscopy;  Laterality: N/A;   COLONOSCOPY WITH PROPOFOL N/A 12/11/2021   Procedure: COLONOSCOPY WITH PROPOFOL;  Surgeon: Lin Landsman, MD;  Location: [facility] ENDOSCOPY;  Service: Gastroenterology;  Laterality: N/A;   ELBOW ARTHROSCOPY WITH TENDON RECONSTRUCTION      ESOPHAGOGASTRODUODENOSCOPY (EGD) WITH PROPOFOL N/A 01/01/2017   Procedure: ESOPHAGOGASTRODUODENOSCOPY (EGD) WITH PROPOFOL;  Surgeon: Jonathon Bellows, MD;  Location: [facility] ENDOSCOPY;  Service: Endoscopy;  Laterality: N/A;   ESOPHAGOGASTRODUODENOSCOPY (EGD) WITH PROPOFOL N/A 12/11/2021   Procedure: ESOPHAGOGASTRODUODENOSCOPY (EGD) WITH PROPOFOL;  Surgeon: Lin Landsman, MD;  Location: [facility] ENDOSCOPY;  Service: Gastroenterology;  Laterality: N/A;   INTRATHECAL PUMP IMPLANT     LEFT HEART CATH AND CORONARY ANGIOGRAPHY N/A 04/28/2017   Procedure: LEFT HEART CATH AND CORONARY ANGIOGRAPHY;  Surgeon: [name], MD;  Location: [facility];  Service: Cardiovascular;  Laterality: N/A;   MASTECTOMY Right 06/1997   morphine pump  2011   PLANTAR FASCIA RELEASE     TRIGGER FINGER RELEASE      Current Meds  Medication Sig   ACCU-CHEK AVIVA PLUS test strip TEST THREE TIMES DAILY   Accu-Chek Softclix Lancets lancets TEST BLOOD SUGAR THREE TIMES DAILY   albuterol (PROVENTIL HFA;VENTOLIN HFA) 108 (90 BASE) MCG/ACT inhaler Inhale 2 puffs into the lungs every 6 (six) hours as needed for wheezing or shortness of breath.    albuterol (PROVENTIL) (2.5 MG/3ML) 0.083% nebulizer solution Take 3 mLs (2.5 mg total) by nebulization every 4 (four) hours as needed for wheezing or shortness of breath.   Alcohol Swabs (DROPSAFE ALCOHOL PREP) 70 % PADS USE TWICE DAILY  WITH  SUGAR  CHECKS   apixaban (ELIQUIS) 5 MG TABS tablet Take 1 tablet (5 mg total) by mouth 2 (two) times daily.   atorvastatin (LIPITOR) 40 MG tablet Take 1 tablet (40 mg total) by mouth daily.   Blood Glucose Monitoring Suppl (TRUE METRIX METER) w/Device KIT Use to check blood sugar 4 times a day   busPIRone (BUSPAR) 5 MG tablet Take 1 tablet (5 mg total) by mouth 2 (two) times daily.   cholecalciferol (VITAMIN D3) 25 MCG (1000 UNIT) tablet Take 1,000 Units by mouth daily.   Cyanocobalamin 1000 MCG/ML KIT Inject 1,000 mcg as directed every 30  (thirty) days.   cyclobenzaprine (FLEXERIL) 5 MG tablet Take 5 mg by mouth 2 (two) times daily as needed for muscle spasms. Takes very rarely   diclofenac (VOLTAREN) 50 MG EC tablet TAKE 1 TABLET THREE TIMES DAILY   Dulaglutide (TRULICITY) 4.5 0000000 SOPN Inject 4.5 mg as directed once a week.   Fluticasone-Umeclidin-Vilant (TRELEGY ELLIPTA) 100-62.5-25 MCG/ACT AEPB Inhale 1 puff into the lungs daily.   folic acid (FOLVITE) 1 MG tablet TAKE 1 TABLET EVERY DAY   furosemide (LASIX) 40 MG tablet Take 1 tablet (40 mg total) by mouth daily.   gabapentin (NEURONTIN) 800 MG tablet Take 1 tablet (800 mg total) by mouth 3 (three) times daily.   insulin degludec (TRESIBA FLEXTOUCH) 100 UNIT/ML FlexTouch Pen Inject 55 Units into the skin at bedtime.   losartan (COZAAR) 25 MG tablet Take 0.5 tablets (12.5 mg total) by mouth daily.   metFORMIN (GLUCOPHAGE) 1000 MG tablet TAKE 1 TABLET TWICE DAILY WITH MEALS   metoprolol succinate (TOPROL-XL) 25 MG 24 hr tablet Take 0.5 tablets (12.5 mg total) by mouth daily.  montelukast (SINGULAIR) 10 MG tablet Take 1 tablet (10 mg total) by mouth at bedtime.   Nebulizers (EASY AIR COMPRESSOR NEBULIZER) MISC Use to inhaler nebulizer treatments at needed per instructions on nebulizer prescription   ondansetron (ZOFRAN) 4 MG tablet Take 1 tablet (4 mg total) by mouth every 8 (eight) hours as needed for nausea or vomiting.   PAIN MANAGEMENT INTRATHECAL, IT, PUMP 1 each by Intrathecal route. Intrathecal (IT) medication:  Morphine Patient does not remember current. Adjusted every 2.5 months at [facility] in [place].   pantoprazole (PROTONIX) 40 MG tablet Take 1 tablet (40 mg total) by mouth daily.   venlafaxine XR (EFFEXOR-XR) 150 MG 24 hr capsule Take 1 capsule (150 mg total) by mouth daily.   Current Facility-Administered Medications for the 10/22/22 encounter (Office Visit) with Percilla Tweten, Harrell Gave, MD  Medication   cyanocobalamin (VITAMIN B12) injection  1,000 mcg    Allergies: Other, Pain patch [menthol], Avelox [moxifloxacin hcl in nacl], Doxycycline, Erythromycin, Fentanyl, Moxifloxacin hcl, Oxycontin [oxycodone], and Ozempic [semaglutide]  Social History   Tobacco Use   Smoking status: Former    Packs/day: 1.00    Years: 30.00    Additional pack years: 0.00    Total pack years: 30.00    Types: Cigarettes    Quit date: 04/30/2004    Years since quitting: 18.4   Smokeless tobacco: Never  Vaping Use   Vaping Use: Never used  Substance Use Topics   Alcohol use: No   Drug use: No    Family History  Problem Relation Age of Onset   Cancer Mother 22       lung   Thyroid disease Mother    Cancer Father 41       Pancreatic   Hypertension Sister    Cancer Sister        "cancer on face"   Hyperlipidemia Sister    Osteoporosis Maternal Grandmother    Cancer Paternal Grandmother        colon   Arthritis Paternal Grandfather    Hyperlipidemia Son    Seizures Son    Breast cancer Neg Hx     Review of Systems: A 12-system review of systems was performed and was negative except as noted in the HPI.  --------------------------------------------------------------------------------------------------  Physical Exam: BP 106/70 (BP Location: Left Arm, Patient Position: Sitting, Cuff Size: Normal)   Pulse 80   Ht 4' 10.5" (1.486 m)   Wt 150 lb (68 kg)   LMP  (LMP Unknown)   SpO2 96%   BMI 30.82 kg/m   General:  NAD. Neck: No JVD or HJR. Lungs: Clear to auscultation bilaterally without wheezes or crackles. Heart: Regular rate and rhythm without murmurs, rubs, or gallops. Abdomen: Soft, nontender, nondistended. Extremities: No lower extremity edema.  EKG: Normal sinus rhythm with PACs and possible septal infarct versus lead placement.  Compared to prior tracing from 07/13/2022, heart rate has decreased.  Lab Results  Component Value Date   WBC 9.4 09/24/2022   HGB 11.9 09/24/2022   HCT 36.6 09/24/2022   MCV 94  09/24/2022   PLT 405 09/24/2022    Lab Results  Component Value Date   NA 138 09/24/2022   K 4.8 09/24/2022   CL 96 09/24/2022   CO2 27 09/24/2022   BUN 14 09/24/2022   CREATININE 1.17 (H) 09/24/2022   GLUCOSE 163 (H) 09/24/2022   ALT 11 09/24/2022    Lab Results  Component Value Date   CHOL 137 09/24/2022  HDL 63 09/24/2022   LDLCALC 56 09/24/2022   TRIG 100 09/24/2022    --------------------------------------------------------------------------------------------------  ASSESSMENT AND PLAN: Chronic HFrEF due to nonischemic cardiomyopathy: Ms. Sirak appears euvolemic with stable NYHA class II symptoms.  I suspect that her dyspnea is largely driven by her underlying lung disease.  Last echo a year ago showed LVEF of 40-45%.  However, subsequent myocardial perfusion stress test only 2 weeks later showed normal LVEF.  Given her chronic shortness of breath and potential need for hip surgery in the near future, we have agreed to repeat an echocardiogram to see if her LVEF has normalized.  We will continue current regimen of metoprolol succinate 12.5 mg daily and losartan 12.5 mg daily, as her borderline low blood pressure precludes escalation.  Given that she has not been retaining any fluid, I have asked Ms. Hoon to decrease furosemide to 20 mg daily in order to help prevent intravascular volume depletion and further blood pressure drops.  Persistent atrial fibrillation: Ms. Dueitt is in sinus rhythm again today and has not had any palpitations.  We will plan to continue low-dose metoprolol as well as long-term anticoagulation with apixaban.  Hypertension: Blood pressure low normal today.  Continue current doses of metoprolol succinate and losartan.  Hyperlipidemia associated with type 2 diabetes mellitus: Lipids well-controlled on last check a month ago.  Continue current dose of atorvastatin and ongoing management of diabetes per [name].  Preoperative  cardiovascular risk assessment: Ms. Theard does not have any unstable cardiac symptoms.  Myocardial perfusion stress test less than a year ago did not show any evidence of ischemia or scar.  As outlined above, we will repeat an echo to reassess her LVEF.  Even if her LVEF remains mildly reduced, it would be reasonable for Ms. Bergeron to proceed with elective orthopedic procedures, which are low risk from a cardiovascular standpoint, without further intervention.  Interruption of anticoagulation should be minimized in the perioperative period given her history of persistent atrial fibrillation and PE.  Follow-up: Return to clinic in 6 months.  [name], MD 10/22/2022 9:12 AM

## 2022-10-27 DIAGNOSIS — Z0189 Encounter for other specified special examinations: Secondary | ICD-10-CM | POA: Diagnosis not present

## 2022-10-27 DIAGNOSIS — M1612 Unilateral primary osteoarthritis, left hip: Secondary | ICD-10-CM | POA: Diagnosis not present

## 2022-10-28 ENCOUNTER — Telehealth: Payer: Self-pay | Admitting: Internal Medicine

## 2022-10-28 ENCOUNTER — Telehealth: Payer: Self-pay

## 2022-10-28 NOTE — Telephone Encounter (Signed)
   Pre-operative Risk Assessment    Patient Name: Jordan Chan  DOB: Oct 05, 1950 MRN: [id number]      Request for Surgical Clearance    Procedure:  Left THR  Date of Surgery:  Clearance 01/15/23                                 Surgeon:  Dr. Mike Gip Group or Practice Name:  [facility]  Phone number:  [phone] Fax number:  [phone]   Type of Clearance Requested:   - Medical Stop Anticoagulant/Antiplatelet prior to surgery   Type of Anesthesia:  Not Indicated   Additional requests/questions:    Signed, Maxwell Caul   10/28/2022, 4:02 PM

## 2022-10-28 NOTE — Telephone Encounter (Signed)
Patient will need to have an appt for Surgical Clearance. Can you please schedule, Thank you

## 2022-10-28 NOTE — Telephone Encounter (Signed)
Pt already has an appointment on 12/23/2022, she will not be having surgery until sometime in June.  I told her that we will just keep that appointment and if provider wants to see her again then we will get that scheduled at that appointment.

## 2022-10-28 NOTE — Telephone Encounter (Signed)
She has been cleared for surgery in note by Dr. Saunders Revel on 10/22/22. Awaiting guidance from Pharm D.

## 2022-10-28 NOTE — Telephone Encounter (Signed)
TY

## 2022-10-30 ENCOUNTER — Telehealth: Payer: Self-pay | Admitting: Internal Medicine

## 2022-10-30 NOTE — Telephone Encounter (Signed)
   Pre-operative Risk Assessment    Patient Name: Jordan Chan  DOB: 28-Nov-1950 MRN: [id number]     Request for Surgical Clearance    Procedure:   LEFT THA ANTERIOR HIP  Date of Surgery:  Clearance 01/15/23                                Surgeon:  DR Kurtis Bushman Surgeon's Group or Practice Name:  [facility] Phone number:  [phone] Fax number:  [phone]  Type of Clearance Requested:   - Medical    Type of Anesthesia:  Local    Additional requests/questions:    Signed, Eli Phillips   10/30/2022, 11:21 AM

## 2022-10-30 NOTE — Telephone Encounter (Signed)
It is reasonable to hold apixaban for 3 days prior to hip replacement.  I think her preop risk for cardioembolic events is low, as her last documented episode of atrial fibrillation occurred in the setting of sepsis in 09/2021.  She was maintaining sinus rhythm at our last visit earlier this month.  I would advocate for restarting apixaban or other appropriate anticoagulation for prevention of VTE in the setting of orthopedic surgery as soon as it is felt safe to do so by Dr. Harlow Mares, as Jordan Chan is at high risk for DVT/PE with her history of PE while hospitalized for pneumonia.  [name], MD [facility]

## 2022-10-30 NOTE — Telephone Encounter (Signed)
Patient with diagnosis of A Fib+ PE on Eliquis for anticoagulation.    Procedure:   Left THR   Date of procedure: 01/15/23   CHA2DS2-VASc Score = 6  This indicates a 9.7% annual risk of stroke. The patient's score is based upon: CHF History: 1 HTN History: 1 Diabetes History: 1 Stroke History: 0 Vascular Disease History: 1 Age Score: 1 Gender Score: 1    CrCl 47 mL/min Platelet count 405K  Typically recommend holding Eliquis for 3 days for hip replacement procedures. However, due to history of A Fib and PE, will route to Dr End for input.   **This guidance is not considered finalized until pre-operative APP has relayed final recommendations.**

## 2022-10-30 NOTE — Telephone Encounter (Addendum)
   Patient Name: Jordan Chan  DOB: 06-23-1951 MRN: [id number]  Primary Cardiologist: [name], MD  Chart reviewed as part of pre-operative protocol coverage.Pt was cleared for procedure at recent office visit with Dr. Saunders Revel on 10/22/2022.  I will forward this information as well as the note from most recent OV to the requesting party via [employer] fax function and remove from pre-op pool.  Please call with questions.  Lenna Sciara, NP 10/30/2022, 11:38 AM

## 2022-10-30 NOTE — Telephone Encounter (Signed)
   Patient Name: Jordan Chan  DOB: 29-Jul-1951 MRN: [id number]  Primary Cardiologist: [name], MD  Clinical pharmacists and Dr. Saunders Revel have reviewed the patient's past medical history, labs, and current medications as part of preoperative protocol coverage. The following recommendations have been made:  Patient with diagnosis of A Fib+ PE on Eliquis for anticoagulation.     Procedure:   Left THR   Date of procedure: 01/15/23     CHA2DS2-VASc Score = 6  This indicates a 9.7% annual risk of stroke. The patient's score is based upon: CHF History: 1 HTN History: 1 Diabetes History: 1 Stroke History: 0 Vascular Disease History: 1 Age Score: 1 Gender Score: 1     CrCl 47 mL/min Platelet count 405K    Per Dr. Saunders Revel, "It is reasonable to hold apixaban for 3 days prior to hip replacement.  I think her preop risk for cardioembolic events is low, as her last documented episode of atrial fibrillation occurred in the setting of sepsis in 09/2021.  She was maintaining sinus rhythm at our last visit earlier this month.  I would advocate for restarting apixaban or other appropriate anticoagulation for prevention of VTE in the setting of orthopedic surgery as soon as it is felt safe to do so by Dr. Harlow Mares, as Jordan Chan is at high risk for DVT/PE with her history of PE while hospitalized for pneumonia.   [name], MD [facility]"   I will route this recommendation to the requesting party via Epic fax function and remove from pre-op pool.  Please call with questions.  Lenna Sciara, NP 10/30/2022, 10:53 AM

## 2022-11-03 ENCOUNTER — Ambulatory Visit: Payer: Medicare HMO

## 2022-11-03 ENCOUNTER — Ambulatory Visit (INDEPENDENT_AMBULATORY_CARE_PROVIDER_SITE_OTHER): Payer: Medicare HMO

## 2022-11-03 DIAGNOSIS — E538 Deficiency of other specified B group vitamins: Secondary | ICD-10-CM

## 2022-11-19 DIAGNOSIS — M47816 Spondylosis without myelopathy or radiculopathy, lumbar region: Secondary | ICD-10-CM | POA: Diagnosis not present

## 2022-11-19 DIAGNOSIS — G894 Chronic pain syndrome: Secondary | ICD-10-CM | POA: Diagnosis not present

## 2022-11-19 DIAGNOSIS — G893 Neoplasm related pain (acute) (chronic): Secondary | ICD-10-CM | POA: Diagnosis not present

## 2022-11-19 DIAGNOSIS — M5442 Lumbago with sciatica, left side: Secondary | ICD-10-CM | POA: Diagnosis not present

## 2022-11-19 DIAGNOSIS — M1612 Unilateral primary osteoarthritis, left hip: Secondary | ICD-10-CM | POA: Diagnosis not present

## 2022-11-24 ENCOUNTER — Ambulatory Visit: Payer: Self-pay | Admitting: *Deleted

## 2022-11-24 ENCOUNTER — Inpatient Hospital Stay
Admission: EM | Admit: 2022-11-24 | Discharge: 2022-12-01 | DRG: 871 | Disposition: A | Discharge status: Home / Self Care | Payer: Medicare HMO | Attending: Internal Medicine | Admitting: Internal Medicine

## 2022-11-24 ENCOUNTER — Other Ambulatory Visit: Payer: Self-pay

## 2022-11-24 ENCOUNTER — Emergency Department: Payer: Medicare HMO

## 2022-11-24 DIAGNOSIS — Z923 Personal history of irradiation: Secondary | ICD-10-CM

## 2022-11-24 DIAGNOSIS — Z7951 Long term (current) use of inhaled steroids: Secondary | ICD-10-CM

## 2022-11-24 DIAGNOSIS — E7800 Pure hypercholesterolemia, unspecified: Secondary | ICD-10-CM | POA: Diagnosis present

## 2022-11-24 DIAGNOSIS — E1165 Type 2 diabetes mellitus with hyperglycemia: Secondary | ICD-10-CM | POA: Diagnosis present

## 2022-11-24 DIAGNOSIS — Z87891 Personal history of nicotine dependence: Secondary | ICD-10-CM

## 2022-11-24 DIAGNOSIS — I482 Chronic atrial fibrillation, unspecified: Secondary | ICD-10-CM | POA: Diagnosis present

## 2022-11-24 DIAGNOSIS — N1831 Chronic kidney disease, stage 3a: Secondary | ICD-10-CM | POA: Diagnosis present

## 2022-11-24 DIAGNOSIS — R0902 Hypoxemia: Secondary | ICD-10-CM | POA: Insufficient documentation

## 2022-11-24 DIAGNOSIS — J9601 Acute respiratory failure with hypoxia: Secondary | ICD-10-CM | POA: Diagnosis present

## 2022-11-24 DIAGNOSIS — R070 Pain in throat: Secondary | ICD-10-CM | POA: Diagnosis not present

## 2022-11-24 DIAGNOSIS — J441 Chronic obstructive pulmonary disease with (acute) exacerbation: Secondary | ICD-10-CM | POA: Diagnosis not present

## 2022-11-24 DIAGNOSIS — Z9981 Dependence on supplemental oxygen: Secondary | ICD-10-CM | POA: Diagnosis not present

## 2022-11-24 DIAGNOSIS — Z794 Long term (current) use of insulin: Secondary | ICD-10-CM | POA: Diagnosis not present

## 2022-11-24 DIAGNOSIS — I4891 Unspecified atrial fibrillation: Secondary | ICD-10-CM | POA: Insufficient documentation

## 2022-11-24 DIAGNOSIS — Z9842 Cataract extraction status, left eye: Secondary | ICD-10-CM

## 2022-11-24 DIAGNOSIS — G894 Chronic pain syndrome: Secondary | ICD-10-CM | POA: Diagnosis present

## 2022-11-24 DIAGNOSIS — Z8619 Personal history of other infectious and parasitic diseases: Secondary | ICD-10-CM

## 2022-11-24 DIAGNOSIS — A419 Sepsis, unspecified organism: Principal | ICD-10-CM | POA: Diagnosis present

## 2022-11-24 DIAGNOSIS — E871 Hypo-osmolality and hyponatremia: Secondary | ICD-10-CM | POA: Diagnosis present

## 2022-11-24 DIAGNOSIS — Z888 Allergy status to other drugs, medicaments and biological substances status: Secondary | ICD-10-CM

## 2022-11-24 DIAGNOSIS — Z853 Personal history of malignant neoplasm of breast: Secondary | ICD-10-CM

## 2022-11-24 DIAGNOSIS — Z20822 Contact with and (suspected) exposure to covid-19: Secondary | ICD-10-CM | POA: Diagnosis not present

## 2022-11-24 DIAGNOSIS — F419 Anxiety disorder, unspecified: Secondary | ICD-10-CM | POA: Diagnosis present

## 2022-11-24 DIAGNOSIS — G4733 Obstructive sleep apnea (adult) (pediatric): Secondary | ICD-10-CM | POA: Diagnosis present

## 2022-11-24 DIAGNOSIS — E872 Acidosis, unspecified: Secondary | ICD-10-CM | POA: Diagnosis not present

## 2022-11-24 DIAGNOSIS — K219 Gastro-esophageal reflux disease without esophagitis: Secondary | ICD-10-CM | POA: Diagnosis present

## 2022-11-24 DIAGNOSIS — Z90710 Acquired absence of both cervix and uterus: Secondary | ICD-10-CM

## 2022-11-24 DIAGNOSIS — Z9011 Acquired absence of right breast and nipple: Secondary | ICD-10-CM

## 2022-11-24 DIAGNOSIS — R000 Tachycardia, unspecified: Secondary | ICD-10-CM | POA: Diagnosis not present

## 2022-11-24 DIAGNOSIS — E1142 Type 2 diabetes mellitus with diabetic polyneuropathy: Secondary | ICD-10-CM | POA: Diagnosis present

## 2022-11-24 DIAGNOSIS — E669 Obesity, unspecified: Secondary | ICD-10-CM | POA: Diagnosis present

## 2022-11-24 DIAGNOSIS — K529 Noninfective gastroenteritis and colitis, unspecified: Secondary | ICD-10-CM | POA: Diagnosis present

## 2022-11-24 DIAGNOSIS — J440 Chronic obstructive pulmonary disease with acute lower respiratory infection: Secondary | ICD-10-CM | POA: Diagnosis present

## 2022-11-24 DIAGNOSIS — J189 Pneumonia, unspecified organism: Secondary | ICD-10-CM | POA: Diagnosis present

## 2022-11-24 DIAGNOSIS — J181 Lobar pneumonia, unspecified organism: Secondary | ICD-10-CM | POA: Diagnosis not present

## 2022-11-24 DIAGNOSIS — I130 Hypertensive heart and chronic kidney disease with heart failure and stage 1 through stage 4 chronic kidney disease, or unspecified chronic kidney disease: Secondary | ICD-10-CM | POA: Diagnosis present

## 2022-11-24 DIAGNOSIS — E1122 Type 2 diabetes mellitus with diabetic chronic kidney disease: Secondary | ICD-10-CM | POA: Diagnosis present

## 2022-11-24 DIAGNOSIS — Z8673 Personal history of transient ischemic attack (TIA), and cerebral infarction without residual deficits: Secondary | ICD-10-CM

## 2022-11-24 DIAGNOSIS — M419 Scoliosis, unspecified: Secondary | ICD-10-CM | POA: Diagnosis present

## 2022-11-24 DIAGNOSIS — R112 Nausea with vomiting, unspecified: Secondary | ICD-10-CM | POA: Diagnosis not present

## 2022-11-24 DIAGNOSIS — Z8249 Family history of ischemic heart disease and other diseases of the circulatory system: Secondary | ICD-10-CM

## 2022-11-24 DIAGNOSIS — R6520 Severe sepsis without septic shock: Secondary | ICD-10-CM | POA: Diagnosis present

## 2022-11-24 DIAGNOSIS — Z7901 Long term (current) use of anticoagulants: Secondary | ICD-10-CM

## 2022-11-24 DIAGNOSIS — J690 Pneumonitis due to inhalation of food and vomit: Secondary | ICD-10-CM | POA: Diagnosis present

## 2022-11-24 DIAGNOSIS — E876 Hypokalemia: Secondary | ICD-10-CM | POA: Diagnosis present

## 2022-11-24 DIAGNOSIS — Z1152 Encounter for screening for COVID-19: Secondary | ICD-10-CM

## 2022-11-24 DIAGNOSIS — R059 Cough, unspecified: Secondary | ICD-10-CM | POA: Diagnosis not present

## 2022-11-24 DIAGNOSIS — Z881 Allergy status to other antibiotic agents status: Secondary | ICD-10-CM

## 2022-11-24 DIAGNOSIS — Z885 Allergy status to narcotic agent status: Secondary | ICD-10-CM

## 2022-11-24 DIAGNOSIS — Z79899 Other long term (current) drug therapy: Secondary | ICD-10-CM

## 2022-11-24 DIAGNOSIS — M545 Low back pain, unspecified: Secondary | ICD-10-CM | POA: Diagnosis present

## 2022-11-24 DIAGNOSIS — R0602 Shortness of breath: Secondary | ICD-10-CM | POA: Diagnosis present

## 2022-11-24 DIAGNOSIS — Z9221 Personal history of antineoplastic chemotherapy: Secondary | ICD-10-CM

## 2022-11-24 DIAGNOSIS — Z9841 Cataract extraction status, right eye: Secondary | ICD-10-CM

## 2022-11-24 DIAGNOSIS — I5032 Chronic diastolic (congestive) heart failure: Secondary | ICD-10-CM | POA: Diagnosis present

## 2022-11-24 DIAGNOSIS — Z7984 Long term (current) use of oral hypoglycemic drugs: Secondary | ICD-10-CM

## 2022-11-24 DIAGNOSIS — Z961 Presence of intraocular lens: Secondary | ICD-10-CM | POA: Diagnosis present

## 2022-11-24 DIAGNOSIS — Z6831 Body mass index (BMI) 31.0-31.9, adult: Secondary | ICD-10-CM

## 2022-11-24 DIAGNOSIS — Z83438 Family history of other disorder of lipoprotein metabolism and other lipidemia: Secondary | ICD-10-CM

## 2022-11-24 DIAGNOSIS — J069 Acute upper respiratory infection, unspecified: Secondary | ICD-10-CM | POA: Diagnosis not present

## 2022-11-24 DIAGNOSIS — M1990 Unspecified osteoarthritis, unspecified site: Secondary | ICD-10-CM | POA: Diagnosis present

## 2022-11-24 LAB — BASIC METABOLIC PANEL
Anion gap: 12 (ref 5–15)
BUN: 21 mg/dL (ref 8–23)
CO2: 25 mmol/L (ref 22–32)
Calcium: 9 mg/dL (ref 8.9–10.3)
Chloride: 96 mmol/L — ABNORMAL LOW (ref 98–111)
Creatinine, Ser: 1.19 mg/dL — ABNORMAL HIGH (ref 0.44–1.00)
GFR, Estimated: 49 mL/min — ABNORMAL LOW (ref >60)
Glucose, Bld: 213 mg/dL — ABNORMAL HIGH (ref 70–99)
Potassium: 4.3 mmol/L (ref 3.5–5.1)
Sodium: 133 mmol/L — ABNORMAL LOW (ref 135–145)

## 2022-11-24 LAB — CBC
HCT: 37.9 % (ref 36.0–46.0)
Hemoglobin: 12.2 g/dL (ref 12.0–15.0)
MCH: 30 pg (ref 26.0–34.0)
MCHC: 32.2 g/dL (ref 30.0–36.0)
MCV: 93.3 fL (ref 80.0–100.0)
Platelets: 341 10*3/uL (ref 150–400)
RBC: 4.06 MIL/uL (ref 3.87–5.11)
RDW: 13.4 % (ref 11.5–15.5)
WBC: 19.4 10*3/uL — ABNORMAL HIGH (ref 4.0–10.5)
nRBC: 0 % (ref 0.0–0.2)

## 2022-11-24 LAB — SARS CORONAVIRUS 2 BY RT PCR: SARS Coronavirus 2 by RT PCR: NEGATIVE

## 2022-11-24 LAB — PROTIME-INR
INR: 1.3 — ABNORMAL HIGH (ref 0.8–1.2)
Prothrombin Time: 15.9 seconds — ABNORMAL HIGH (ref 11.4–15.2)

## 2022-11-24 LAB — TROPONIN I (HIGH SENSITIVITY)
Troponin I (High Sensitivity): 5 ng/L (ref <18)
Troponin I (High Sensitivity): 7 ng/L (ref <18)

## 2022-11-24 LAB — LIPASE, BLOOD: Lipase: 29 U/L (ref 11–51)

## 2022-11-24 LAB — LACTIC ACID, PLASMA: Lactic Acid, Venous: 2.3 mmol/L (ref 0.5–1.9)

## 2022-11-24 MED ORDER — ACETAMINOPHEN 325 MG PO TABS
650.0000 mg | ORAL_TABLET | Freq: Once | ORAL | Status: AC
  Administered 2022-11-24: 650 mg via ORAL
  Filled 2022-11-24: qty 2

## 2022-11-24 MED ORDER — SODIUM CHLORIDE 0.9 % IV SOLN
500.0000 mg | INTRAVENOUS | Status: DC
  Administered 2022-11-24: 500 mg via INTRAVENOUS
  Filled 2022-11-24: qty 5

## 2022-11-24 MED ORDER — ALBUTEROL SULFATE HFA 108 (90 BASE) MCG/ACT IN AERS
2.0000 | INHALATION_SPRAY | RESPIRATORY_TRACT | Status: DC | PRN
  Administered 2022-11-25 – 2022-12-01 (×3): 2 via RESPIRATORY_TRACT
  Filled 2022-11-24: qty 6.7

## 2022-11-24 MED ORDER — LACTATED RINGERS IV BOLUS
500.0000 mL | Freq: Once | INTRAVENOUS | Status: AC
  Administered 2022-11-24: 500 mL via INTRAVENOUS

## 2022-11-24 MED ORDER — LACTATED RINGERS IV SOLN: INTRAVENOUS | Status: DC

## 2022-11-24 MED ORDER — SODIUM CHLORIDE 0.9 % IV SOLN
2.0000 g | INTRAVENOUS | Status: DC
  Administered 2022-11-24: 2 g via INTRAVENOUS
  Filled 2022-11-24: qty 20

## 2022-11-24 MED ORDER — ONDANSETRON HCL 4 MG/2ML IJ SOLN
4.0000 mg | Freq: Once | INTRAMUSCULAR | Status: AC | PRN
  Administered 2022-11-24: 4 mg via INTRAVENOUS
  Filled 2022-11-24: qty 2

## 2022-11-24 NOTE — Sepsis Progress Note (Signed)
Elink monitoring for the code sepsis protocol.  

## 2022-11-24 NOTE — Telephone Encounter (Signed)
Called patient to discuss her message.  No answer. HIPAA compliant vm left requesting return call.  Ok for nurse triage to give results if patient calls back.

## 2022-11-24 NOTE — ED Provider Notes (Addendum)
[facility] Provider Note    Event Date/Time   First MD Initiated Contact with Patient 11/24/22 2244     (approximate)   History   Shortness of Breath and Vomiting   HPI  Jordan Chan is a 72 y.o. female   past medical history of atrial fibrillation, congestive heart failure, COPD diabetes  For 2 days has had a cough congestion.  Reports mild shortness of breath.  No chest pain.  No abdominal pain.  Not been able to eat much for food, when she does eat she feels nauseated and has had some vomiting.  Currently not having any abdominal pain or ongoing nausea but reports if she eats something and [place] her stomach.  Adductive cough for 2 days.   No recent hospitalizations.  No leg swelling   Physical Exam   Triage Vital Signs: ED Triage Vitals  Enc Vitals Group     BP 11/24/22 2116 (!) 122/41     Pulse Rate 11/24/22 2116 (!) 104     Resp 11/24/22 2116 20     Temp 11/24/22 2116 99.4 F (37.4 C)     Temp Source 11/24/22 2116 Oral     SpO2 11/24/22 2116 94 %     Weight 11/24/22 2119 152 lb (68.9 kg)     Height 11/24/22 2119 4' 10.5" (1.486 m)     Head Circumference --      Peak Flow --      Pain Score 11/24/22 2115 4     Pain Loc --      Pain Edu? --      Excl. in GC? --     Most recent vital signs: Vitals:   11/24/22 2241 11/24/22 2300  BP:  (!) 113/91  Pulse:  (!) 104  Resp:  20  Temp:    SpO2: 97%      General: Awake, no distress.  Appears mildly ill.  Currently on 2 L oxygen normal saturation frequent congested cough. CV:  Good peripheral perfusion.  Mild tachycardia, no murmurs Resp:  Normal effort.  Notable Rales in the left lower lobe only.  Remainder of lungs and other lung fields bilaterally are clear.  No wheezing.  No accessory muscle use. Abd:  No distention.  Soft nontender nondistended throughout Other:  No lower extremity edema venous cords or congestion   ED Results / Procedures / Treatments   Labs (all  labs ordered are listed, but only abnormal results are displayed) Labs Reviewed  CBC - Abnormal; Notable for the following components:      Result Value   WBC 19.4 (*)    All other components within normal limits  PROTIME-INR - Abnormal; Notable for the following components:   Prothrombin Time 15.9 (*)    INR 1.3 (*)    All other components within normal limits  SARS CORONAVIRUS 2 BY RT PCR  RESP PANEL BY RT-PCR (RSV, FLU A&B, COVID)  RVPGX2  CULTURE, BLOOD (ROUTINE X 2)  CULTURE, BLOOD (ROUTINE X 2)  URINALYSIS, ROUTINE W REFLEX MICROSCOPIC  BASIC METABOLIC PANEL  LIPASE, BLOOD  LACTIC ACID, PLASMA  LACTIC ACID, PLASMA  URINALYSIS, COMPLETE (UACMP) WITH MICROSCOPIC  CBG MONITORING, ED  TROPONIN I (HIGH SENSITIVITY)  TROPONIN I (HIGH SENSITIVITY)     EKG  Interpreted as left bundle branch block, sinus tachycardia.   RADIOLOGY  Chest x-ray interpreted by me as left lower lung infiltrative disease   PROCEDURES:  Critical Care performed: No  Procedures  MEDICATIONS ORDERED IN ED: Medications  albuterol (VENTOLIN HFA) 108 (90 Base) MCG/ACT inhaler 2 puff (has no administration in time range)  lactated ringers infusion (has no administration in time range)  cefTRIAXone (ROCEPHIN) 2 g in sodium chloride 0.9 % 100 mL IVPB (2 g Intravenous New Bag/Given 11/24/22 2309)  azithromycin (ZITHROMAX) 500 mg in sodium chloride 0.9 % 250 mL IVPB (500 mg Intravenous New Bag/Given 11/24/22 2309)  ondansetron (ZOFRAN) injection 4 mg (4 mg Intravenous Given 11/24/22 2138)  acetaminophen (TYLENOL) tablet 650 mg (650 mg Oral Given 11/24/22 2308)  lactated ringers bolus 500 mL (500 mLs Intravenous New Bag/Given 11/24/22 2325)     IMPRESSION / MDM / ASSESSMENT AND PLAN / ED COURSE  I reviewed the triage vital signs and the nursing notes.                              Differential diagnosis includes, but is not limited to, infection, probable pneumonia with associated productive cough,  low-grade fever, leukocytosis, mild oxygen requirement.  No obvious wheezing or evidence of acute COPD exacerbation.  No chest pain.  No cardiac symptomatology.  No pleuritic pain.  No clinical exam findings that would be suggestive of acute thromboembolic disease.  In the setting of cough productive cough fevers fatigue nausea and occasional vomiting clinical exam seems to be most suggestive community-acquired pneumonia.  No recent hospitalizations.  Does meet sepsis criteria.  Code sepsis initiated.  Pending metabolic panel and lactic acid, Dr. Renaldo Reel will follow-up on this.  I did discuss the case with our hospitalist who is accepting of plan for admission (Dr. Melburn Popper)  Patient and daughter both understand agreeable plan for admission  Patient's presentation is most consistent with acute complicated illness / injury requiring diagnostic workup.   The patient is on the cardiac monitor to evaluate for evidence of arrhythmia and/or significant heart rate changes.  Metabolic panel and lactic acid pending.  Dr. Modesto Charon to follow-up on (along with Dr Allena Katz)     FINAL CLINICAL IMPRESSION(S) / ED DIAGNOSES   Final diagnoses:  Community acquired pneumonia of left lower lobe of lung  Sepsis, due to unspecified organism, unspecified whether acute organ dysfunction present     Rx / DC Orders   ED Discharge Orders     None        Note:  This document was prepared using Dragon voice recognition software and may include unintentional dictation errors.   Sharyn Creamer, MD 11/24/22 [place]    Sharyn Creamer, MD 11/24/22 [phone]

## 2022-11-24 NOTE — ED Triage Notes (Signed)
Pt seen at urgent care for vomiting and SOB. Pt was told she probably has pneumonia and was told to go to [facility]" but they were closed. Pt reports cough starting Saturday, and then worsening vomiting/SOB yesterday, weakness/fatigue. Denies fevers. Reports chest feels like it's burning.

## 2022-11-24 NOTE — Telephone Encounter (Signed)
Reason for Disposition . [1] MODERATE weakness (i.e., interferes with work, school, normal activities) AND [2] cause unknown  (Exceptions: Weakness from acute minor illness or poor fluid intake; weakness is chronic and not worse.)  Answer Assessment - Initial Assessment Questions 1. DESCRIPTION: "Describe how you are feeling."     Weakness - can't eat - nauseous - vomited several times last night 2. SEVERITY: "How bad is it?"  "Can you stand and walk?"   - MILD (0-3): Feels weak or tired, but does not interfere with work, school or normal activities.   - MODERATE (4-7): Able to stand and walk; weakness interferes with work, school, or normal activities.   - SEVERE (8-10): Unable to stand or walk; unable to do usual activities.     Severe 3. ONSET: "When did these symptoms begin?" (e.g., hours, days, weeks, months)     Saturday 4. CAUSE: "What do you think is causing the weakness or fatigue?" (e.g., not drinking enough fluids, medical problem, trouble sleeping)     COVID? Flu 5. NEW MEDICINES:  "Have you started on any new medicines recently?" (e.g., opioid pain medicines, benzodiazepines, muscle relaxants, antidepressants, antihistamines, neuroleptics, beta blockers)     no 6. OTHER SYMPTOMS: "Do you have any other symptoms?" (e.g., chest pain, fever, cough, SOB, vomiting, diarrhea, bleeding, other areas of pain)     Vomiting nausea, weakness cough  Protocols used: Weakness (Generalized) and Fatigue-A-AH

## 2022-11-24 NOTE — Telephone Encounter (Signed)
Called [phone] 3rd attempt to review sx of flu sx sore throat vomiting. No answer, recording call cannot be completed as dialed. Call again. Please advise regarding sx.      Reason for Disposition  Third attempt to contact caller AND no contact made. Phone number verified.  Answer Assessment - Initial Assessment Questions N/A No contact made with patient after 3 tries on #[phone]  Protocols used: No Contact or Duplicate Contact Call-A-AH

## 2022-11-24 NOTE — Telephone Encounter (Signed)
  Chief Complaint: Weakness, cough, vomiting bile Symptoms: above Frequency: Saturday night  Pertinent Negatives: Patient denies Fever Disposition: ED /[x] Urgent Care (no appt availability in office) / Appointment(In office/virtual)/  [facility]/ Home Care/ Refused Recommended Disposition /[] [facility]/  Follow-up with PCP Additional Notes: PT states she started to feel poorly on Saturday. On Sunday she was worse. Pt states she vomited bile last night. She has a wet cough, she is nauseous and weak.  PT will go to UC for care.

## 2022-11-24 NOTE — ED Notes (Signed)
Hospitalist in with pt   pt alert  family at bedside.

## 2022-11-24 NOTE — Telephone Encounter (Signed)
Summary: Vomiting   Flu symptoms, sore throat, vomiting. [phone]        Called patient [phone] to review sx flu sore throat and vomiting. No answer, recording call can not by completed as dialed. Consult direction call again. Unable to leave message

## 2022-11-25 DIAGNOSIS — G894 Chronic pain syndrome: Secondary | ICD-10-CM | POA: Diagnosis present

## 2022-11-25 DIAGNOSIS — I4891 Unspecified atrial fibrillation: Secondary | ICD-10-CM | POA: Diagnosis not present

## 2022-11-25 DIAGNOSIS — Z794 Long term (current) use of insulin: Secondary | ICD-10-CM | POA: Diagnosis not present

## 2022-11-25 DIAGNOSIS — K529 Noninfective gastroenteritis and colitis, unspecified: Secondary | ICD-10-CM | POA: Diagnosis not present

## 2022-11-25 DIAGNOSIS — J189 Pneumonia, unspecified organism: Secondary | ICD-10-CM | POA: Diagnosis present

## 2022-11-25 DIAGNOSIS — R6520 Severe sepsis without septic shock: Secondary | ICD-10-CM | POA: Diagnosis present

## 2022-11-25 DIAGNOSIS — J441 Chronic obstructive pulmonary disease with (acute) exacerbation: Secondary | ICD-10-CM | POA: Diagnosis present

## 2022-11-25 DIAGNOSIS — J440 Chronic obstructive pulmonary disease with acute lower respiratory infection: Secondary | ICD-10-CM | POA: Diagnosis present

## 2022-11-25 DIAGNOSIS — E872 Acidosis, unspecified: Secondary | ICD-10-CM | POA: Diagnosis present

## 2022-11-25 DIAGNOSIS — I482 Chronic atrial fibrillation, unspecified: Secondary | ICD-10-CM

## 2022-11-25 DIAGNOSIS — E7800 Pure hypercholesterolemia, unspecified: Secondary | ICD-10-CM | POA: Diagnosis present

## 2022-11-25 DIAGNOSIS — E1165 Type 2 diabetes mellitus with hyperglycemia: Secondary | ICD-10-CM | POA: Diagnosis present

## 2022-11-25 DIAGNOSIS — A419 Sepsis, unspecified organism: Secondary | ICD-10-CM | POA: Diagnosis present

## 2022-11-25 DIAGNOSIS — J181 Lobar pneumonia, unspecified organism: Secondary | ICD-10-CM | POA: Diagnosis not present

## 2022-11-25 DIAGNOSIS — E871 Hypo-osmolality and hyponatremia: Secondary | ICD-10-CM

## 2022-11-25 DIAGNOSIS — E1142 Type 2 diabetes mellitus with diabetic polyneuropathy: Secondary | ICD-10-CM | POA: Diagnosis present

## 2022-11-25 DIAGNOSIS — J9601 Acute respiratory failure with hypoxia: Secondary | ICD-10-CM

## 2022-11-25 DIAGNOSIS — E1122 Type 2 diabetes mellitus with diabetic chronic kidney disease: Secondary | ICD-10-CM | POA: Diagnosis present

## 2022-11-25 DIAGNOSIS — J690 Pneumonitis due to inhalation of food and vomit: Secondary | ICD-10-CM | POA: Diagnosis present

## 2022-11-25 DIAGNOSIS — Z1152 Encounter for screening for COVID-19: Secondary | ICD-10-CM | POA: Diagnosis not present

## 2022-11-25 DIAGNOSIS — N1831 Chronic kidney disease, stage 3a: Secondary | ICD-10-CM | POA: Diagnosis present

## 2022-11-25 DIAGNOSIS — I130 Hypertensive heart and chronic kidney disease with heart failure and stage 1 through stage 4 chronic kidney disease, or unspecified chronic kidney disease: Secondary | ICD-10-CM | POA: Diagnosis present

## 2022-11-25 DIAGNOSIS — E876 Hypokalemia: Secondary | ICD-10-CM | POA: Diagnosis present

## 2022-11-25 DIAGNOSIS — Z9981 Dependence on supplemental oxygen: Secondary | ICD-10-CM | POA: Diagnosis not present

## 2022-11-25 DIAGNOSIS — R0602 Shortness of breath: Secondary | ICD-10-CM | POA: Diagnosis present

## 2022-11-25 DIAGNOSIS — I5032 Chronic diastolic (congestive) heart failure: Secondary | ICD-10-CM | POA: Diagnosis present

## 2022-11-25 DIAGNOSIS — F419 Anxiety disorder, unspecified: Secondary | ICD-10-CM | POA: Diagnosis present

## 2022-11-25 DIAGNOSIS — E669 Obesity, unspecified: Secondary | ICD-10-CM | POA: Diagnosis present

## 2022-11-25 LAB — GLUCOSE, CAPILLARY
Glucose-Capillary: 166 mg/dL — ABNORMAL HIGH (ref 70–99)
Glucose-Capillary: 194 mg/dL — ABNORMAL HIGH (ref 70–99)
Glucose-Capillary: 141 mg/dL — ABNORMAL HIGH (ref 70–99)
Glucose-Capillary: 160 mg/dL — ABNORMAL HIGH (ref 70–99)

## 2022-11-25 LAB — URINALYSIS, COMPLETE (UACMP) WITH MICROSCOPIC
Bilirubin Urine: NEGATIVE
Glucose, UA: NEGATIVE mg/dL
Hgb urine dipstick: NEGATIVE
Ketones, ur: 5 mg/dL — AB
Nitrite: NEGATIVE
Protein, ur: 30 mg/dL — AB
Specific Gravity, Urine: 1.021 (ref 1.005–1.030)
pH: 6 (ref 5.0–8.0)

## 2022-11-25 LAB — RESP PANEL BY RT-PCR (RSV, FLU A&B, COVID)  RVPGX2
Influenza A by PCR: NEGATIVE
Influenza B by PCR: NEGATIVE
Resp Syncytial Virus by PCR: NEGATIVE
SARS Coronavirus 2 by RT PCR: NEGATIVE

## 2022-11-25 LAB — CBG MONITORING, ED: Glucose-Capillary: 187 mg/dL — ABNORMAL HIGH (ref 70–99)

## 2022-11-25 LAB — PROCALCITONIN: Procalcitonin: 1.65 ng/mL

## 2022-11-25 LAB — LACTIC ACID, PLASMA: Lactic Acid, Venous: 2 mmol/L (ref 0.5–1.9)

## 2022-11-25 MED ORDER — APIXABAN 5 MG PO TABS
5.0000 mg | ORAL_TABLET | Freq: Two times a day (BID) | ORAL | Status: DC
  Administered 2022-11-25 – 2022-12-01 (×14): 5 mg via ORAL
  Filled 2022-11-25 (×14): qty 1

## 2022-11-25 MED ORDER — METOPROLOL TARTRATE 25 MG PO TABS
25.0000 mg | ORAL_TABLET | Freq: Two times a day (BID) | ORAL | Status: DC
  Administered 2022-11-25 – 2022-11-28 (×7): 25 mg via ORAL
  Filled 2022-11-25 (×9): qty 1

## 2022-11-25 MED ORDER — INSULIN GLARGINE-YFGN 100 UNIT/ML ~~LOC~~ SOLN
55.0000 [IU] | Freq: Every day | SUBCUTANEOUS | Status: DC
  Administered 2022-11-25 – 2022-11-27 (×4): 55 [IU] via SUBCUTANEOUS
  Filled 2022-11-25 (×5): qty 0.55

## 2022-11-25 MED ORDER — SODIUM CHLORIDE 0.9 % IV BOLUS
250.0000 mL | Freq: Once | INTRAVENOUS | Status: AC
  Administered 2022-11-25: 250 mL via INTRAVENOUS

## 2022-11-25 MED ORDER — INSULIN ASPART 100 UNIT/ML IJ SOLN
0.0000 [IU] | Freq: Three times a day (TID) | INTRAMUSCULAR | Status: DC
  Administered 2022-11-25: 2 [IU] via SUBCUTANEOUS
  Administered 2022-11-25 (×2): 3 [IU] via SUBCUTANEOUS
  Administered 2022-11-26: 8 [IU] via SUBCUTANEOUS
  Administered 2022-11-26: 2 [IU] via SUBCUTANEOUS
  Administered 2022-11-27: 5 [IU] via SUBCUTANEOUS
  Administered 2022-11-27: 3 [IU] via SUBCUTANEOUS
  Administered 2022-11-27 – 2022-11-28 (×2): 5 [IU] via SUBCUTANEOUS
  Administered 2022-11-28 – 2022-11-29 (×2): 3 [IU] via SUBCUTANEOUS
  Administered 2022-11-29: 11 [IU] via SUBCUTANEOUS
  Administered 2022-11-29: 2 [IU] via SUBCUTANEOUS
  Administered 2022-11-30: 15 [IU] via SUBCUTANEOUS
  Administered 2022-11-30: 3 [IU] via SUBCUTANEOUS
  Administered 2022-11-30: 5 [IU] via SUBCUTANEOUS
  Administered 2022-12-01 (×2): 11 [IU] via SUBCUTANEOUS
  Filled 2022-11-25 (×19): qty 1

## 2022-11-25 MED ORDER — SODIUM CHLORIDE 0.9 % IV SOLN
3.0000 g | Freq: Three times a day (TID) | INTRAVENOUS | Status: DC
  Administered 2022-11-25 – 2022-11-26 (×2): 3 g via INTRAVENOUS
  Filled 2022-11-25 (×3): qty 8

## 2022-11-25 MED ORDER — INSULIN ASPART 100 UNIT/ML IJ SOLN
0.0000 [IU] | Freq: Every day | INTRAMUSCULAR | Status: DC
  Administered 2022-11-26: 2 [IU] via SUBCUTANEOUS
  Administered 2022-11-27 – 2022-11-28 (×2): 3 [IU] via SUBCUTANEOUS
  Administered 2022-11-30: 5 [IU] via SUBCUTANEOUS
  Filled 2022-11-25 (×5): qty 1

## 2022-11-25 MED ORDER — ATORVASTATIN CALCIUM 20 MG PO TABS
40.0000 mg | ORAL_TABLET | Freq: Every day | ORAL | Status: DC
  Administered 2022-11-25 – 2022-12-01 (×7): 40 mg via ORAL
  Filled 2022-11-25 (×7): qty 2

## 2022-11-25 MED ORDER — PAIN MANAGEMENT IT PUMP REFILL
1.0000 | INTRATHECAL | Status: DC
  Filled 2022-11-25: qty 1

## 2022-11-25 MED ORDER — CLONAZEPAM 0.125 MG PO TBDP
0.1250 mg | ORAL_TABLET | Freq: Once | ORAL | Status: AC
  Administered 2022-11-25: 0.125 mg via ORAL
  Filled 2022-11-25: qty 1

## 2022-11-25 MED ORDER — LEVALBUTEROL HCL 0.63 MG/3ML IN NEBU: 0.6300 mg | INHALATION_SOLUTION | Freq: Four times a day (QID) | RESPIRATORY_TRACT | Status: DC | PRN

## 2022-11-25 MED ORDER — GABAPENTIN 400 MG PO CAPS
800.0000 mg | ORAL_CAPSULE | Freq: Three times a day (TID) | ORAL | Status: DC
  Administered 2022-11-25 – 2022-12-01 (×19): 800 mg via ORAL
  Filled 2022-11-25 (×19): qty 2

## 2022-11-25 MED ORDER — BUSPIRONE HCL 10 MG PO TABS
5.0000 mg | ORAL_TABLET | Freq: Two times a day (BID) | ORAL | Status: DC
  Administered 2022-11-25 – 2022-12-01 (×14): 5 mg via ORAL
  Filled 2022-11-25 (×14): qty 1

## 2022-11-25 MED ORDER — VITAMIN D 25 MCG (1000 UNIT) PO TABS
1000.0000 [IU] | ORAL_TABLET | Freq: Every day | ORAL | Status: DC
  Administered 2022-11-25 – 2022-12-01 (×7): 1000 [IU] via ORAL
  Filled 2022-11-25 (×7): qty 1

## 2022-11-25 MED ORDER — MONTELUKAST SODIUM 10 MG PO TABS
10.0000 mg | ORAL_TABLET | Freq: Every day | ORAL | Status: DC
  Administered 2022-11-25 – 2022-11-30 (×7): 10 mg via ORAL
  Filled 2022-11-25 (×7): qty 1

## 2022-11-25 MED ORDER — LORAZEPAM 0.5 MG PO TABS
0.5000 mg | ORAL_TABLET | ORAL | Status: DC | PRN
  Administered 2022-11-26: 0.5 mg via ORAL
  Filled 2022-11-25: qty 1

## 2022-11-25 MED ORDER — FUROSEMIDE 20 MG PO TABS
20.0000 mg | ORAL_TABLET | Freq: Every day | ORAL | Status: DC
  Administered 2022-11-25 – 2022-12-01 (×7): 20 mg via ORAL
  Filled 2022-11-25 (×7): qty 1

## 2022-11-25 MED ORDER — VENLAFAXINE HCL ER 150 MG PO CP24
150.0000 mg | ORAL_CAPSULE | Freq: Every day | ORAL | Status: DC
  Administered 2022-11-25 – 2022-12-01 (×7): 150 mg via ORAL
  Filled 2022-11-25 (×7): qty 1

## 2022-11-25 MED ORDER — LOSARTAN POTASSIUM 25 MG PO TABS
12.5000 mg | ORAL_TABLET | Freq: Every day | ORAL | Status: DC
  Administered 2022-11-25 – 2022-12-01 (×7): 12.5 mg via ORAL
  Filled 2022-11-25 (×7): qty 1

## 2022-11-25 MED ORDER — FLUTICASONE FUROATE-VILANTEROL 100-25 MCG/ACT IN AEPB
1.0000 | INHALATION_SPRAY | Freq: Every day | RESPIRATORY_TRACT | Status: DC
  Administered 2022-11-25 – 2022-11-26 (×2): 1 via RESPIRATORY_TRACT
  Filled 2022-11-25: qty 28

## 2022-11-25 MED ORDER — HYDROCODONE-ACETAMINOPHEN 5-325 MG PO TABS
1.0000 | ORAL_TABLET | Freq: Three times a day (TID) | ORAL | Status: DC | PRN
  Administered 2022-11-25 – 2022-11-28 (×6): 1 via ORAL
  Filled 2022-11-25 (×6): qty 1

## 2022-11-25 MED ORDER — UMECLIDINIUM BROMIDE 62.5 MCG/ACT IN AEPB
1.0000 | INHALATION_SPRAY | Freq: Every day | RESPIRATORY_TRACT | Status: DC
  Administered 2022-11-25 – 2022-11-26 (×2): 1 via RESPIRATORY_TRACT
  Filled 2022-11-25: qty 7

## 2022-11-25 MED ORDER — CYCLOBENZAPRINE HCL 10 MG PO TABS
5.0000 mg | ORAL_TABLET | Freq: Two times a day (BID) | ORAL | Status: DC | PRN
  Administered 2022-11-25 – 2022-11-26 (×2): 5 mg via ORAL
  Filled 2022-11-25 (×2): qty 1

## 2022-11-25 NOTE — ED Notes (Signed)
Report received, this RN now assuming care.  

## 2022-11-25 NOTE — Progress Notes (Signed)
Progress Note   Patient: Jordan Chan:[id number] DOB: 30-Mar-1951 DOA: 11/24/2022     0 DOS: the patient was seen and examined on 11/25/2022   Brief hospital course: Jordan Chan is a 72 y.o. female with medical history significant of COPD on night oxygen, chronic atrial fibrillation, chronic diastolic congestive heart failure, type 2 diabetes, gastroesophageal reflux disease, essential hypertension, dyslipidemia, chronic pain with a pain pump, obstructive sleep apnea, history of stroke, who present to the hospital with worsening shortness of breath, he had significant respiratory distress and hypoxemia, was placed on 5 L oxygen.  Chest x-ray showed left lower lobe pneumonia.  Patient also had a significant vomiting for the last few days, pneumonia probably due to aspiration.  She met sepsis criteria at admission with significant tachycardia, leukocytosis, mild elevation of lactic acid level. Antibiotic changed to Unasyn for aspiration pneumonia after admission.   Principal Problem:   Pneumonia Active Problems:   CKD stage 3a, GFR 45-59 ml/min   Sepsis   Uncontrolled type 2 diabetes mellitus with hyperglycemia, without long-term current use of insulin   Atrial fibrillation with RVR   Acute hypoxemic respiratory failure   Hypoxia   Gastroenteritis   Community acquired pneumonia of left lower lobe of lung   Lactic acid acidosis   CAP (community acquired pneumonia)   Chronic atrial fibrillation with RVR   Hyponatremia   Assessment and Plan:  Left lower lobe aspiration pneumonia. Sepsis secondary to pneumonia. Acute hypoxemic respiratory failure secondary to aspiration pneumonia and  COPD exacerbation. Patient met sepsis criteria with family mission, this is a secondary to pneumonia. Patient had some vomiting 2 days ago, she also seem to have some upper airway secretion she could not clear.  Most likely aspiration pneumonia.  Antibiotic changed to Unasyn at this time.  Also  check a procalcitonin level to guide treatment; if this should not be elevated significantly if due to aspiration pneumonia. Obtain speech therapy evaluation. Patient also has significant aggressive distress, with some wheezing.  Continue bronchodilators.  I did not start the steroids due to significant hyperglycemia.  Chronic atrial fibrillation with RVR. Chronic diastolic congestive heart failure. Patient does not appear to be in volume overload.  Her heart rate is elevated, start a beta-blocker.  Continue to follow.  Patient is also taking Eliquis.  Chronic kidney disease stage IIIa Hyponatremia. Stable, continue to follow.  Uncontrolled type 2 diabetes with hyperglycemia on long-term insulin use. Restart home dose insulin glargine and sliding scale insulin.  Chronic pain syndrome. Debility. Anxiety. Patient had outpatient pain pump for last 20 years, last change of medication was 5 years ago. She also has difficulty with walking at home, will obtain PT/OT. She also has significant anxiety, making her short of breath worse.  Started low-dose benzodiazepine.  Obesity. BMI 31.23, diet exercise advised.      Subjective:  Patient still has signal short of breath with exertion, on 5 L oxygen. Cough, not able to cough up.  She appeared to have some upper airway secretion and she could not get rid of.  Physical Exam: Vitals:   11/25/22 0500 11/25/22 0538 11/25/22 0654 11/25/22 0731  BP:  (!) 124/105 129/70 (!) 126/91  Pulse:  (!) 131 (!) 115 (!) 116  Resp:   20 20  Temp:  98.2 F (36.8 C) 98.5 F (36.9 C) 98.2 F (36.8 C)  TempSrc:      SpO2: 98% 92% 99% 93%  Weight:      Height:  General exam: Appears calm and comfortable  Respiratory system: Rhonchi in the bases. Respiratory effort normal. Cardiovascular system: Irregular and tachycardic. No JVD, murmurs, rubs, gallops or clicks. No pedal edema. Gastrointestinal system: Abdomen is nondistended, soft and  nontender. No organomegaly or masses felt. Normal bowel sounds heard. Central nervous system: Alert and oriented. No focal neurological deficits. Extremities: Symmetric 5 x 5 power. Skin: No rashes, lesions or ulcers Psychiatry: Judgement and insight appear normal. Mood & affect appropriate.    Data Reviewed:  Reviewed the chest x-ray results, lab results.  Family Communication: Daughter updated at bedside.  Disposition: Status is: Inpatient Remains inpatient appropriate because: Severity disease, IV treatment.     Time spent: 50 minutes  Author: Marrion Coy, MD 11/25/2022 11:01 AM  For on call review [email or web address].

## 2022-11-25 NOTE — Progress Notes (Signed)
CODE SEPSIS - PHARMACY COMMUNICATION  **Broad Spectrum Antibiotics should be administered within 1 hour of Sepsis diagnosis**  Time Code Sepsis Called/Page Received: 4/22 @ 2252   Antibiotics Ordered: Azithromycin ,  Ceftriaxone   Time of 1st antibiotic administration: Azithromycin 500 mg IV X 1 on 4/22 @ 2309   Additional action taken by pharmacy:   If necessary, Name of Provider/Nurse Contacted:     Ilhan Debenedetto D ,PharmD Clinical Pharmacist  11/25/2022  12:12 AM

## 2022-11-25 NOTE — ED Notes (Signed)
Fsbs 187

## 2022-11-25 NOTE — H&P (Signed)
History and Physical    Patient: Jordan Chan ZOX:[id number] DOB: 11-Jun-1951 DOA: 11/24/2022 DOS: the patient was seen and examined on 11/25/2022 PCP: Marjie Skiff, NP  Patient coming from: Home  Chief Complaint:  Chief Complaint  Patient presents with   Shortness of Breath   Vomiting   HPI: Jordan Chan is a 72 y.o. female with medical history significant of COPD, however patient does not use supplementary oxygen at home.  Patient reports being in her usual state of health till 4 days ago, 3 days ago patient reports having a new onset of cough which was mostly dry associated with some increased sensation of shortness of breath.  Patient typically does have some sensation of shortness of breath while doing activities of daily living at home or walking on level ground, however this sensation of shortness of breath increased yes 3 days ago.  There was no chest pain palpitation presyncope leg swelling cramping or fever.  For the last 2 days patient also reports having about 6 episodes of vomiting without any blood associated with similar number of bowel movements.  Patient does not report having loose bowel movements but rather formed multiple bowel movements.  There is no abdominal pain.  Patient also reports being unable to tolerate p.o. diet given her numerous episodes of vomiting which would be triggered by attempting to eat.   Has developed marked frustration and fatigue over the last 48 hours and comes to the ER because of this.  ER course is notable for the patient receiving IV fluids inhaled bronchodilator therapy as well as IV antibiotics.  Patient is feeling much better after she was given supplementary oxygen, her breathing is much better.  However she still feels marked fatigue.  Patient has chronic low back pain for which she has a morphine infusion pump.  Patient reports no unusual pattern of pain Review of Systems: As mentioned in the history of present illness. All  other systems reviewed and are negative. Past Medical History:  Diagnosis Date   Arthritis    Asthma    Atrial fibrillation    Breast cancer 1998   right breast ca with mastectomy and chemotherapy and radiation   Bronchitis    CHF (congestive heart failure)    "with Morphine withdrawal"   COPD (chronic obstructive pulmonary disease)    Diabetes mellitus without complication    Diverticulitis    diverticulosis also   Dyspnea    Endometriosis    GERD (gastroesophageal reflux disease)    History of shingles 2000-2005   Hypercholesteremia    Hypertension    IBS (irritable bowel syndrome)    Low back pain    a. Implanted morphine/bupivicaine/clonidine pump.   Neuropathy    Orthopnea    Oxygen dependent    uses at night   Personal history of chemotherapy    Personal history of radiation therapy    Pneumonia    pneumonia 5-6 times, history of bronchitis also   Scoliosis    Sleep apnea    does not use cpap   Stroke 2010   TIA, 10 years ago   Withdrawal from sedative drug    withdrawal from morphine when pump batteries died   Past Surgical History:  Procedure Laterality Date   ABDOMINAL HYSTERECTOMY  1987   BACK SURGERY     Tailbone removed following fracture   BREAST SURGERY Right    mastectomy   CARDIAC CATHETERIZATION     CATARACT EXTRACTION W/PHACO Right 05/19/2019  Procedure: CATARACT EXTRACTION PHACO AND INTRAOCULAR LENS PLACEMENT (IOC), RIGHT, DIABETIC;  Surgeon: Elliot Cousin, MD;  Location: [facility] ORS;  Service: Ophthalmology;  Laterality: Right;  Lot # [id number] H [place]: 00:43.8 CDE: 4.59   CATARACT EXTRACTION W/PHACO Left 06/16/2019   Procedure: CATARACT EXTRACTION PHACO AND INTRAOCULAR LENS PLACEMENT (IOC) LEFT VISION BLUE DIABETIC;  Surgeon: Elliot Cousin, MD;  Location: [facility] ORS;  Service: Ophthalmology;  Laterality: Left;  Lot #[id number] H [place]: 00:46.9 CDE: 6.53   COCCYX REMOVAL     COLONOSCOPY WITH PROPOFOL N/A 01/01/2017   Procedure: COLONOSCOPY WITH PROPOFOL;   Surgeon: Wyline Mood, MD;  Location: [facility] ENDOSCOPY;  Service: Endoscopy;  Laterality: N/A;   COLONOSCOPY WITH PROPOFOL N/A 12/11/2021   Procedure: COLONOSCOPY WITH PROPOFOL;  Surgeon: Toney Reil, MD;  Location: [facility] ENDOSCOPY;  Service: Gastroenterology;  Laterality: N/A;   ELBOW ARTHROSCOPY WITH TENDON RECONSTRUCTION     ESOPHAGOGASTRODUODENOSCOPY (EGD) WITH PROPOFOL N/A 01/01/2017   Procedure: ESOPHAGOGASTRODUODENOSCOPY (EGD) WITH PROPOFOL;  Surgeon: Wyline Mood, MD;  Location: [facility] ENDOSCOPY;  Service: Endoscopy;  Laterality: N/A;   ESOPHAGOGASTRODUODENOSCOPY (EGD) WITH PROPOFOL N/A 12/11/2021   Procedure: ESOPHAGOGASTRODUODENOSCOPY (EGD) WITH PROPOFOL;  Surgeon: Toney Reil, MD;  Location: [facility] ENDOSCOPY;  Service: Gastroenterology;  Laterality: N/A;   INTRATHECAL PUMP IMPLANT     LEFT HEART CATH AND CORONARY ANGIOGRAPHY N/A 04/28/2017   Procedure: LEFT HEART CATH AND CORONARY ANGIOGRAPHY;  Surgeon: Yvonne Kendall, MD;  Location: [facility] INVASIVE CV LAB;  Service: Cardiovascular;  Laterality: N/A;   MASTECTOMY Right 06/1997   morphine pump  2011   PLANTAR FASCIA RELEASE     TRIGGER FINGER RELEASE     Social History:  reports that she quit smoking about 18 years ago. Her smoking use included cigarettes. She has a 30.00 pack-year smoking history. She has never used smokeless tobacco. She reports that she does not drink alcohol and does not use drugs.  Allergies  Allergen Reactions   Other Palpitations    IV steroids   Pain Patch [Menthol] Anaphylaxis   Avelox [Moxifloxacin Hcl In Nacl] Other (See Comments)    Upset stomach   Doxycycline Diarrhea   Erythromycin Nausea Only and Other (See Comments)    Can take a Z-Pak just fine Other reaction(s): Other (See Comments) Can take Z-Pak  Can take a Z-Pak just fine   Fentanyl Nausea Only and Rash   Moxifloxacin Hcl Other (See Comments)    Upset stomach Upset stomach   Oxycontin [Oxycodone] Hives   Ozempic [Semaglutide]  Nausea Only    Family History  Problem Relation Age of Onset   Cancer Mother 57       lung   Thyroid disease Mother    Cancer Father 9       Pancreatic   Hypertension Sister    Cancer Sister        "cancer on face"   Hyperlipidemia Sister    Osteoporosis Maternal Grandmother    Cancer Paternal Grandmother        colon   Arthritis Paternal Grandfather    Hyperlipidemia Son    Seizures Son    Breast cancer Neg Hx     Prior to Admission medications   Medication Sig Start Date End Date Taking? Authorizing Provider  ACCU-CHEK AVIVA PLUS test strip TEST THREE TIMES DAILY 05/11/21  Yes [name], Jolene T, NP  Accu-Chek Softclix Lancets lancets TEST BLOOD SUGAR THREE TIMES DAILY 10/03/22  Yes [name], Jolene T, NP  albuterol (PROVENTIL HFA;VENTOLIN HFA) 108 (90 BASE) MCG/ACT inhaler  Inhale 2 puffs into the lungs every 6 (six) hours as needed for wheezing or shortness of breath.    Yes [provider]  albuterol (PROVENTIL) (2.5 MG/3ML) 0.083% nebulizer solution Take 3 mLs (2.5 mg total) by nebulization every 4 (four) hours as needed for wheezing or shortness of breath. 07/18/22 07/18/23 Yes Dorcas Carrow, MD  Alcohol Swabs (DROPSAFE ALCOHOL PREP) 70 % PADS USE TWICE DAILY  WITH  SUGAR  CHECKS 11/14/21  Yes [name], Jolene T, NP  apixaban (ELIQUIS) 5 MG TABS tablet Take 1 tablet (5 mg total) by mouth 2 (two) times daily. 06/24/22  Yes [name], Jolene T, NP  atorvastatin (LIPITOR) 40 MG tablet Take 1 tablet (40 mg total) by mouth daily. 06/25/22  Yes [name], Jolene T, NP  Blood Glucose Monitoring Suppl (TRUE METRIX METER) w/Device KIT Use to check blood sugar 4 times a day 05/14/21  Yes [name], Jolene T, NP  busPIRone (BUSPAR) 5 MG tablet Take 1 tablet (5 mg total) by mouth 2 (two) times daily. 05/20/22  Yes [name], Jolene T, NP  cholecalciferol (VITAMIN D3) 25 MCG (1000 UNIT) tablet Take 1,000 Units by mouth daily.   Yes [provider]  Cyanocobalamin 1000 MCG/ML KIT  Inject 1,000 mcg as directed every 30 (thirty) days.   Yes [provider]  cyclobenzaprine (FLEXERIL) 5 MG tablet Take 5 mg by mouth 2 (two) times daily as needed for muscle spasms. Takes very rarely   Yes [provider]  doxycycline (VIBRAMYCIN) 100 MG capsule Take 100 mg by mouth 2 (two) times daily. 11/24/22  Yes [provider]  Dulaglutide (TRULICITY) 4.5 MG/0.5ML SOPN Inject 4.5 mg as directed once a week. 03/04/22  Yes [name], Jolene T, NP  Fluticasone-Umeclidin-Vilant (TRELEGY ELLIPTA) 100-62.5-25 MCG/ACT AEPB Inhale 1 puff into the lungs daily. 09/24/22  Yes [name], Corrie Dandy T, NP  folic acid (FOLVITE) 1 MG tablet TAKE 1 TABLET EVERY DAY 10/14/21  Yes Alinda Dooms, NP  furosemide (LASIX) 40 MG tablet Take 0.5 tablets (20 mg total) by mouth daily. 10/22/22  Yes End, Cristal Deer, MD  gabapentin (NEURONTIN) 800 MG tablet Take 1 tablet (800 mg total) by mouth 3 (three) times daily. 12/12/21  Yes [name], Jolene T, NP  HYDROcodone-acetaminophen (NORCO/VICODIN) 5-325 MG tablet Take 1 tablet by mouth every 8 (eight) hours as needed. 11/19/22 12/04/22 Yes [provider]  insulin degludec (TRESIBA FLEXTOUCH) 100 UNIT/ML FlexTouch Pen Inject 55 Units into the skin at bedtime. 09/26/22  Yes [name], Jolene T, NP  losartan (COZAAR) 25 MG tablet Take 0.5 tablets (12.5 mg total) by mouth daily. 06/24/22  Yes [name], Jolene T, NP  metFORMIN (GLUCOPHAGE) 1000 MG tablet TAKE 1 TABLET TWICE DAILY WITH MEALS 12/04/21  Yes [name], Jolene T, NP  metoprolol succinate (TOPROL-XL) 25 MG 24 hr tablet Take 0.5 tablets (12.5 mg total) by mouth daily. 06/24/22  Yes [name], Jolene T, NP  montelukast (SINGULAIR) 10 MG tablet Take 1 tablet (10 mg total) by mouth at bedtime. 06/24/22  Yes [name], Corrie Dandy T, NP  Nebulizers (EASY AIR COMPRESSOR NEBULIZER) MISC Use to inhaler nebulizer treatments at needed per instructions on nebulizer prescription 07/21/22  Yes [name], Jolene T, NP   ondansetron (ZOFRAN) 4 MG tablet Take 1 tablet (4 mg total) by mouth every 8 (eight) hours as needed for nausea or vomiting. 03/14/22  Yes [name], Jolene T, NP  PAIN MANAGEMENT INTRATHECAL, IT, PUMP 1 each by Intrathecal route. Intrathecal (IT) medication:  Morphine Patient does not remember current. Adjusted every  2.5 months at [facility] in [place].   Yes [provider]  pantoprazole (PROTONIX) 40 MG tablet Take 1 tablet (40 mg total) by mouth daily. 12/12/21  Yes [name], Jolene T, NP  venlafaxine XR (EFFEXOR-XR) 150 MG 24 hr capsule Take 1 capsule (150 mg total) by mouth daily. 06/24/22  Yes Aura Dials T, NP  diclofenac (VOLTAREN) 50 MG EC tablet TAKE 1 TABLET THREE TIMES DAILY Patient not taking: Reported on 11/24/2022 02/03/22   Marjie Skiff, NP    Physical Exam: Vitals:   11/24/22 2119 11/24/22 2238 11/24/22 2241 11/24/22 2300  BP:    (!) 113/91  Pulse:    (!) 104  Resp:    20  Temp:      TempSrc:      SpO2: 98% (!) 87% 97%   Weight: 68.9 kg     Height: 4' 10.5" (1.486 m)      General: Patient appears fatigued, however in no immediate distress. Respiratory exam bilateral air entry is vesicular no expiratory wheezes or crackles are heard Cardiovascular exam S1-S2 normal irregular Abdomen soft nontender patient has a subcutaneous infusion pump in the right lower abdomen Extremities warm without edema. Neurologically patient is alert awake giving coherent account of last few days.  Daughter at the bedside Data Reviewed:  Labs on Admission:  Results for orders placed or performed during the hospital encounter of 11/24/22 (from the past 24 hour(s))  CBC     Status: Abnormal   Collection Time: 11/24/22  9:33 PM  Result Value Ref Range   WBC 19.4 (H) 4.0 - 10.5 K/uL   RBC 4.06 3.87 - 5.11 MIL/uL   Hemoglobin 12.2 12.0 - 15.0 g/dL   HCT 29.5 62.1 - 30.8 %   MCV 93.3 80.0 - 100.0 fL   MCH 30.0 26.0 - 34.0 pg   MCHC 32.2 30.0 - 36.0 g/dL    RDW 65.7 84.6 - 96.2 %   Platelets 341 150 - 400 K/uL   nRBC 0.0 0.0 - 0.2 %  Troponin I (High Sensitivity)     Status: None   Collection Time: 11/24/22  9:33 PM  Result Value Ref Range   Troponin I (High Sensitivity) 5 <18 ng/L  Protime-INR (order if Patient is taking Coumadin / Warfarin)     Status: Abnormal   Collection Time: 11/24/22  9:33 PM  Result Value Ref Range   Prothrombin Time 15.9 (H) 11.4 - 15.2 seconds   INR 1.3 (H) 0.8 - 1.2  SARS Coronavirus 2 by RT PCR (hospital order, performed in [facility] hospital lab) *cepheid single result test* Anterior Nasal Swab     Status: None   Collection Time: 11/24/22  9:35 PM   Specimen: Anterior Nasal Swab  Result Value Ref Range   SARS Coronavirus 2 by RT PCR NEGATIVE NEGATIVE  Basic metabolic panel     Status: Abnormal   Collection Time: 11/24/22 11:01 PM  Result Value Ref Range   Sodium 133 (L) 135 - 145 mmol/L   Potassium 4.3 3.5 - 5.1 mmol/L   Chloride 96 (L) 98 - 111 mmol/L   CO2 25 22 - 32 mmol/L   Glucose, Bld 213 (H) 70 - 99 mg/dL   BUN 21 8 - 23 mg/dL   Creatinine, Ser 9.52 (H) 0.44 - 1.00 mg/dL   Calcium 9.0 8.9 - 84.1 mg/dL   GFR, Estimated 49 (L) >60 mL/min   Anion gap 12 5 - 15  Lipase, blood  Status: None   Collection Time: 11/24/22 11:01 PM  Result Value Ref Range   Lipase 29 11 - 51 U/L  Lactic acid, plasma     Status: Abnormal   Collection Time: 11/24/22 11:01 PM  Result Value Ref Range   Lactic Acid, Venous 2.3 (HH) 0.5 - 1.9 mmol/L  Troponin I (High Sensitivity)     Status: None   Collection Time: 11/24/22 11:01 PM  Result Value Ref Range   Troponin I (High Sensitivity) 7 <18 ng/L       Radiological Exams on Admission:  DG Chest 2 View  Result Date: 11/24/2022 CLINICAL DATA:  Shortness of breath EXAM: CHEST - 2 VIEW COMPARISON:  07/15/2022 FINDINGS: Heart and mediastinal contours are within normal limits. Aortic atherosclerosis. Airspace disease in the left lower lobe compatible with  pneumonia. Right lung clear. No effusions. No acute bony abnormality. IMPRESSION: Left lower lobe airspace opacity concerning for pneumonia. Electronically Signed   By: Charlett Nose M.D.   On: 11/24/2022 22:06    EKG: Independently reviewed.  Sinus tachycardia   Assessment and Plan: * Pneumonia With at least some new hypoxia given that the patient does use nighttime supplementary oxygen but not during daytime.  S/p ceftriaxone and azithromycin in the ER associated with marked leukocytosis and sepsis.  Mild lactic acidosis in the context of metformin use at home.  At this time patient has received half a liter of saline bolus associated with running IV fluids.  I will favor stopping iv fludis in this lady once lactic acid normalises. and continue with ceftriaxone and azithromycin and monitor her hypoxia.  As well as WBC trend  Gastroenteritis Check stool for C. difficile if loose watery bowel movements otherwise antibiotics as above  Hypoxia Continue with oxygen as needed due to above as well as underlying diagnosis of COPD  Atrial fibrillation with RVR Patient is already rate controlled.  Continue with apixaban as at home      Advance Care Planning:   Code Status: Prior full code  Consults: None  Family Communication: Daughter at the bedside during this encounter all questions answered  Severity of Illness: The appropriate patient status for this patient is INPATIENT. Inpatient status is judged to be reasonable and necessary in order to provide the required intensity of service to ensure the patient's safety. The patient's presenting symptoms, physical exam findings, and initial radiographic and laboratory data in the context of their chronic comorbidities is felt to place them at high risk for further clinical deterioration. Furthermore, it is not anticipated that the patient will be medically stable for discharge from the hospital within 2 midnights of admission.   * I certify that at  the point of admission it is my clinical judgment that the patient will require inpatient hospital care spanning beyond 2 midnights from the point of admission due to high intensity of service, high risk for further deterioration and high frequency of surveillance required.*  Author: Nolberto Hanlon, MD 11/25/2022 12:04 AM  For on call review [email or web address].

## 2022-11-25 NOTE — Progress Notes (Signed)
PT arrived to unit c/o SOB. O2 sats reading 85 on 3L [place]. Pt pursed lip breathing in tripod position. HR elevated in the 130s.  PT moved up to 5L [place] with little improved. Placed on NRB.Respiratory therapist called to bedside to see patient. RT placed on HFNC.  Provider notified of VS. EKG ordered. Attempted to obtain using two machines and unable to get a clear reading due to patient movement. Attempted for and hour.PT continues to c/o SOB despite improvement of o2 sats. Provider notified of such.

## 2022-11-25 NOTE — ED Notes (Signed)
Pt sleeping, easily aroused, iv fluids infusing.  Meds given.

## 2022-11-25 NOTE — Evaluation (Signed)
Clinical/Bedside Swallow Evaluation Patient Details  Name: Jordan Chan MRN: [id number] Date of Birth: 02-Nov-1950  Today's Date: 11/25/2022 Time: SLP Start Time (ACUTE ONLY): 1140 SLP Stop Time (ACUTE ONLY): 1235 SLP Time Calculation (min) (ACUTE ONLY): 55 min  Past Medical History:  Past Medical History:  Diagnosis Date   Arthritis    Asthma    Atrial fibrillation    Breast cancer 1998   right breast ca with mastectomy and chemotherapy and radiation   Bronchitis    CHF (congestive heart failure)    "with Morphine withdrawal"   COPD (chronic obstructive pulmonary disease)    Diabetes mellitus without complication    Diverticulitis    diverticulosis also   Dyspnea    Endometriosis    GERD (gastroesophageal reflux disease)    History of shingles 2000-2005   Hypercholesteremia    Hypertension    IBS (irritable bowel syndrome)    Low back pain    a. Implanted morphine/bupivicaine/clonidine pump.   Neuropathy    Orthopnea    Oxygen dependent    uses at night   Personal history of chemotherapy    Personal history of radiation therapy    Pneumonia    pneumonia 5-6 times, history of bronchitis also   Scoliosis    Sleep apnea    does not use cpap   Stroke 2010   TIA, 10 years ago   Withdrawal from sedative drug    withdrawal from morphine when pump batteries died   Past Surgical History:  Past Surgical History:  Procedure Laterality Date   ABDOMINAL HYSTERECTOMY  1987   BACK SURGERY     Tailbone removed following fracture   BREAST SURGERY Right    mastectomy   CARDIAC CATHETERIZATION     CATARACT EXTRACTION W/PHACO Right 05/19/2019   Procedure: CATARACT EXTRACTION PHACO AND INTRAOCULAR LENS PLACEMENT (IOC), RIGHT, DIABETIC;  Surgeon: Elliot Cousin, MD;  Location: [facility] ORS;  Service: Ophthalmology;  Laterality: Right;  Lot # [id number] H [place]: 00:43.8 CDE: 4.59   CATARACT EXTRACTION W/PHACO Left 06/16/2019   Procedure: CATARACT EXTRACTION PHACO AND INTRAOCULAR LENS  PLACEMENT (IOC) LEFT VISION BLUE DIABETIC;  Surgeon: Elliot Cousin, MD;  Location: [facility] ORS;  Service: Ophthalmology;  Laterality: Left;  Lot #[id number] H [place]: 00:46.9 CDE: 6.53   COCCYX REMOVAL     COLONOSCOPY WITH PROPOFOL N/A 01/01/2017   Procedure: COLONOSCOPY WITH PROPOFOL;  Surgeon: Wyline Mood, MD;  Location: [facility] ENDOSCOPY;  Service: Endoscopy;  Laterality: N/A;   COLONOSCOPY WITH PROPOFOL N/A 12/11/2021   Procedure: COLONOSCOPY WITH PROPOFOL;  Surgeon: Toney Reil, MD;  Location: [facility] ENDOSCOPY;  Service: Gastroenterology;  Laterality: N/A;   ELBOW ARTHROSCOPY WITH TENDON RECONSTRUCTION     ESOPHAGOGASTRODUODENOSCOPY (EGD) WITH PROPOFOL N/A 01/01/2017   Procedure: ESOPHAGOGASTRODUODENOSCOPY (EGD) WITH PROPOFOL;  Surgeon: Wyline Mood, MD;  Location: [facility] ENDOSCOPY;  Service: Endoscopy;  Laterality: N/A;   ESOPHAGOGASTRODUODENOSCOPY (EGD) WITH PROPOFOL N/A 12/11/2021   Procedure: ESOPHAGOGASTRODUODENOSCOPY (EGD) WITH PROPOFOL;  Surgeon: Toney Reil, MD;  Location: [facility] ENDOSCOPY;  Service: Gastroenterology;  Laterality: N/A;   INTRATHECAL PUMP IMPLANT     LEFT HEART CATH AND CORONARY ANGIOGRAPHY N/A 04/28/2017   Procedure: LEFT HEART CATH AND CORONARY ANGIOGRAPHY;  Surgeon: Yvonne Kendall, MD;  Location: [facility] INVASIVE CV LAB;  Service: Cardiovascular;  Laterality: N/A;   MASTECTOMY Right 06/1997   morphine pump  2011   PLANTAR FASCIA RELEASE     TRIGGER FINGER RELEASE     HPI:  Pt is a 72 y.o. female  with medical history significant of COPD on night oxygen, scoliosis, obstructive sleep apnea - does not use cpap, dyspnea, chronic atrial fibrillation, chronic diastolic congestive heart failure, type 2 diabetes, GERD/REFLUX, essential hypertension, dyslipidemia, chronic pain with a pain pump, history of stroke, who present to the hospital with worsening shortness of breath, she had significant respiratory distress and hypoxemia, was placed on 5 L oxygen. Patient also had a  significant Vomiting for the last few days, pneumonia probably due to aspiration of vomit.  Chest x-ray showed left lower lobe pneumonia. Previous CXR in 07/2022: No active disease.   She met sepsis criteria at admission with significant tachycardia, leukocytosis, mild elevation of lactic acid level.  Pt stated she has declined Pulmonary status at baseline requiring her to sleep in a recliner at home "since 1998" for better breathing.    Assessment / Plan / Recommendation  Clinical Impression   Pt seen for BSE today. Pt awake, verbal and followed all instructions. A/O x4. Family present in room. Noted ease of WOB w/ any exertion in bed, including w/ increased talking. Pulmonary decline and Dry Cough at Baseline.  Pt had previous BSEs in 09/2021 - mech soft diet, thins rec'd then. At previous BSE, it was noted that pt exhibited a Dry Cough during oral intake of evaluation at Baseline [phone]) -- "low suspicion for relation to intake given delayed timing and dry nature without persistence given continued trials. Patient demonstrated functional oropharyngeal phase swallowing". On Alpha O2 support 4L; afebrile.    Patient appears to present w/ functional oropharyngeal swallow as demonstrated by no immediate, overt clinical s/s aspiration across multiple thin liquids, puree and solid trials today. Pt appears to present w/ adequate oropharyngeal phase swallow w/ No oropharyngeal phase dysphagia noted, No neuromuscular deficits noted. Pt appears at reduced risk for aspiration when following general aspiration precautions and following strategies for conservation of energy.   However, pt does have challenging factors that could impact oropharyngeal swallowing to include Baseline Pulmonary decline w/ ease of WOB w/ exertion including increased talking, deconditioned status/weakness, and acute illness including recent Vomiting w/ concern for aspiration, pneumonia resulting from it. These factors can increase risk for  dysphagia, aspiration as well as decreased oral intake overall.   During po trials, pt consumed all consistencies w/ no immediate, overt coughing, decline in vocal quality, or change in respiratory presentation during/post trials. O2 sats remained in mid90s post. A dry cough occurred between trials inconsistently; consistent w/ her Baseline cough. There was low suspicion for relation to the oral intake given delayed timing and dry nature and without persistence given continued trials. Patient demonstrated functional oral phase with adequate containment and management, timely preparation and A-P transfer, and complete oral clearance. Mastication WFL despite absent lower dentition.  Patient denied difficulty or changes to swallowing in addition to demonstration without overt, immediate s/s of swallowing difficulties. Family in room denied any difficulty swallowing stating pt seemed at her Baseline; pt agreed. NSG has not reported any difficulty swallowing Pills nor at meals.  OM Exam appeared [facility] w/ no unilateral weakness noted. Speech Clear. Pt fed self w/ setup support.   Recommend continue a Regular consistency diet(ordered by MD) w/ well-Cut meats, moistened foods for conservation of energy; Thin liquids -- carefully monitor any straw use and use Cup for best control when drinking if needed. Recommend general aspiration precautions, Rest Breaks during meals for conservation of energy in setting of Baseline Pulmonary decline/COPD. Pills WHOLE in Puree for safer, easier swallowing --  if needed. Pt agreed.   This SLP educated patient and family re: elevated risk of aspiration/development of dysphagia in setting of COPD therefore aspiration precautions are recommended. Education given on Pills in Puree; food consistencies and easy to eat options; single sips slowly w/ rest breaks during meals to calm breathing to pt and Dtr/family present. Pt and Family agreed. NSG updated, agreed. MD updated. Recommend  Dietician f/u for support. Recommend monitoring status post d/c home a/ PCP if any concern for dysphagia develops. Pt agreed. SLP Visit Diagnosis: Dysphagia, unspecified (R13.10)    Aspiration Risk  Mild aspiration risk;Risk for inadequate nutrition/hydration (reduced when following general aspiration precautions) in setting of Baseline Pulmonary decline.   Diet Recommendation   continue a Regular consistency diet(ordered by MD) w/ well-Cut meats, moistened foods for conservation of energy; Thin liquids -- carefully monitor any straw use and use Cup for best control when drinking if needed. Recommend general aspiration precautions, Rest Breaks during meals for conservation of energy in setting of Baseline Pulmonary decline/COPD.   Medication Administration: Whole meds with liquid (pt prefers -- but educated on Whole in Puree if needed)    Other  Recommendations Recommended Consults:  (dietician f/u) Oral Care Recommendations: Oral care BID;Oral care before and after PO;Patient independent with oral care (setup)    Recommendations for follow up therapy are one component of a multi-disciplinary discharge planning process, led by the attending physician.  Recommendations may be updated based on patient status, additional functional criteria and insurance authorization.  Follow up Recommendations Follow physician's recommendations for discharge plan and follow up therapies      Assistance Recommended at Discharge  Intermittent; setup support  Functional Status Assessment Patient has not had a recent decline in their functional status (per pt, family)  Frequency and Duration min 1 x/week  1 week       Prognosis Prognosis for improved oropharyngeal function: Good Barriers to Reach Goals: Time post onset;Severity of deficits Barriers/Prognosis Comment: baseline Pulmonary decline; GERD      Swallow Study   General Date of Onset: 11/24/22 HPI: Pt is a 72 y.o. female with medical history  significant of COPD on night oxygen, chronic atrial fibrillation, chronic diastolic congestive heart failure, type 2 diabetes, GERD/REFLUX, essential hypertension, dyslipidemia, chronic pain with a pain pump, obstructive sleep apnea, history of stroke, who present to the hospital with worsening shortness of breath, she had significant respiratory distress and hypoxemia, was placed on 5 L oxygen. Patient also had a significant Vomiting for the last few days, pneumonia probably due to aspiration of vomit.  Chest x-ray showed left lower lobe pneumonia. Previous CXR in 07/2022: No active disease.   She met sepsis criteria at admission with significant tachycardia, leukocytosis, mild elevation of lactic acid level.  Pt stated she has declined Pulmonary status at baseline requiring her to sleep in a recliner at home "since 1998" for better breathing. Type of Study: Bedside Swallow Evaluation Previous Swallow Assessment: 09/2021 - mech soft diet, thins then. At previous BSE, it was noted that pt had a Dry Cough during oral intake of evaluation at Baseline then(2023). Diet Prior to this Study: Regular;Thin liquids (Level 0) Temperature Spikes Noted: No (wbc 19.4) Respiratory Status: Nasal cannula (4L) History of Recent Intubation: No Behavior/Cognition: Alert;Cooperative;Pleasant mood;Distractible;Requires cueing Oral Cavity Assessment: Within Functional Limits Oral Care Completed by SLP: Recent completion by staff Oral Cavity - Dentition: Dentures, top (No bottom Dentition) Vision: Functional for self-feeding Self-Feeding Abilities: Able to feed self;Needs set up Patient  Positioning: Upright in bed (supported w/ Pillows lower behind back) Baseline Vocal Quality: Normal Volitional Cough: Strong Volitional Swallow: Able to elicit    Oral/Motor/Sensory Function Overall Oral Motor/Sensory Function: Within functional limits   Ice Chips Ice chips: Not tested   Thin Liquid Thin Liquid: Within functional  limits Presentation: Straw;Self Fed (12+ trials) Other Comments: pt's dry cough was intermittent (~3-4) b/t trials -- pt verbalized the cough NOT d/t drinking liquids    Nectar Thick Nectar Thick Liquid: Not tested   Honey Thick Honey Thick Liquid: Not tested   Puree Puree: Within functional limits Presentation: Self Fed;Spoon (5 trials) Other Comments: Dry cough x1-2 b/t   Solid     Solid: Within functional limits Presentation: Self Fed (6+ trials) Other Comments: Dry cough x1-2 b/t       Jerilynn Som, MS, CCC-SLP Speech Language Pathologist Rehab Services; [facility] [phone] (ascom) Drago Hammonds 11/25/2022,2:30 PM

## 2022-11-25 NOTE — ED Notes (Addendum)
Pt ambulated to the toilet with assistance. Pt was dyspneic with the exertion, pt's oxygen increased to 3L [employer]. Pulse ox WNL

## 2022-11-25 NOTE — Hospital Course (Addendum)
 Jordan Chan is a 72 y.o. female with medical history significant of COPD on night oxygen , chronic atrial fibrillation, chronic diastolic congestive heart failure, type 2 diabetes, gastroesophageal reflux disease, essential hypertension, dyslipidemia, chronic pain with a pain pump, obstructive sleep apnea, history of stroke, who present to the hospital with worsening shortness of breath, he had significant respiratory distress and hypoxemia, was placed on 5 L oxygen .  Chest x-ray showed left lower lobe pneumonia.  Patient also had a significant vomiting for the last few days, pneumonia probably due to aspiration.  She met sepsis criteria at admission with significant tachycardia, leukocytosis, mild elevation of lactic acid level. Antibiotic changed to Unasyn  for aspiration pneumonia after admission.  4/24.  With bronchospasm and wheezing started Solu-Medrol .  Switch her inhalers over to nebulizers.  Able to switch from high flow nasal cannula 4 L over the regular nasal cannula 3 L. 4/25.  Continue Solu-Medrol  with bronchospasm. 4/26.  Still with bronchospasm.  Continue Solu-Medrol . 4/27.  Patient finally starting to feel better.  Still has wheeze more on the right side.  Desaturated with coming off oxygen  with ambulation. 4/28.  Patient not feeling as well today.  Dropped oxygen  saturations with ambulation down to 83% at its lowest. 4/28.  Patient able to hold her saturations in the 90s with ambulation.  Patient feeling well enough to go home.

## 2022-11-25 NOTE — TOC Progression Note (Signed)
Transition of Care [facility]) - Progression Note    Patient Details  Name: Jordan Chan MRN: [id number] Date of Birth: 1950-09-15  Transition of Care [facility]) CM/SW Contact  Marlowe Sax, RN Phone Number: 11/25/2022, 3:13 PM  Clinical Narrative:    Spoke with the patient's daughter patricia, She stated that the patient had DME at home and does not need additional, she would like to get [facility] PT and OT, they have no preference of agencies I  sent the referral to [place] at [facility] Awaiting a response to see if they can accept  Expected Discharge Plan: Home w Home Health Services Barriers to Discharge: Continued Medical Work up  Expected Discharge Plan and Services   Discharge Planning Services: CM Consult   Living arrangements for the past 2 months: Mobile Home                 DME Arranged: N/A         HH Arranged: PT, OT HH Agency: [facility] Date [facility] Agency Contacted: 11/25/22 Time HH Agency Contacted: 1513 Representative spoke with at [facility] Agency: [place]   Social Determinants of Health (SDOH) Interventions SDOH Screenings   Food Insecurity: No Food Insecurity (07/21/2022)  Housing: Medium Risk (07/14/2022)  Transportation Needs: No Transportation Needs (07/21/2022)  Utilities: Not At Risk (07/15/2022)  Alcohol Screen: Low Risk  (12/16/2021)  Depression (PHQ2-9): Medium Risk (09/24/2022)  Financial Resource Strain: Low Risk  (06/16/2022)  Recent Concern: Financial Resource Strain - Medium Risk (05/23/2022)  Physical Activity: Insufficiently Active (12/16/2021)  Social Connections: Moderately Isolated (12/16/2021)  Stress: No Stress Concern Present (12/16/2021)  Tobacco Use: Medium Risk (11/24/2022)    Readmission Risk Interventions     No data to display

## 2022-11-25 NOTE — Evaluation (Signed)
Physical Therapy Evaluation Patient Details Name: Jordan Chan MRN: [id number] DOB: 16-May-1951 Today's Date: 11/25/2022  History of Present Illness  Pt is a 72 y.o. female with medical history significant of COPD on night oxygen, chronic atrial fibrillation, chronic diastolic congestive heart failure, type 2 diabetes, gastroesophageal reflux disease, essential hypertension, dyslipidemia, chronic back pain with a pain pump, obstructive sleep apnea, history of stroke, who present to the hospital with worsening shortness of breath. MD assessment includes: left lower lobe aspiration pneumonia, sepsis secondary to pneumonia, and acute hypoxemic respiratory failure secondary to aspiration pneumonia and COPD exacerbation.   Clinical Impression  Pt was pleasant and motivated to participate during the session and put forth good effort throughout. Pt found on 4LO2/min with poor pleth but SpO2 reading consistently in the mid to upper 90s during the session.  Pt required no physical assistance during the session and was generally steady with transfers and gait with no overt LOB.  Pt was able to take several short walks with a RW with min verbal cues for general sequencing for safety.  Pt reported no adverse symptoms during the session other than her chronic back.  Pt will benefit from continued PT services upon discharge to safely address deficits listed in patient problem list for decreased caregiver assistance and eventual return to PLOF.          Recommendations for follow up therapy are one component of a multi-disciplinary discharge planning process, led by the attending physician.  Recommendations may be updated based on patient status, additional functional criteria and insurance authorization.  Follow Up Recommendations       Assistance Recommended at Discharge Intermittent Supervision/Assistance  Patient can return home with the following  A little help with walking and/or transfers;A little  help with bathing/dressing/bathroom;Assist for transportation;Help with stairs or ramp for entrance    Equipment Recommendations None recommended by PT  Recommendations for Other Services       Functional Status Assessment Patient has had a recent decline in their functional status and demonstrates the ability to make significant improvements in function in a reasonable and predictable amount of time.     Precautions / Restrictions Precautions Precautions: Fall Restrictions Weight Bearing Restrictions: No      Mobility  Bed Mobility Overal bed mobility: Modified Independent             General bed mobility comments: Min extra time and effort only    Transfers Overall transfer level: Needs assistance Equipment used: Rolling walker (2 wheels) Transfers: Sit to/from Stand Sit to Stand: Supervision           General transfer comment: Min verbal cues for hand placement with fair to good control and stability    Ambulation/Gait Ambulation/Gait assistance: Supervision Gait Distance (Feet): 15 Feet x 2, 10 feet x 1 Assistive device: Rolling walker (2 wheels) Gait Pattern/deviations: Step-through pattern, Decreased step length - right, Decreased step length - left, Trunk flexed Gait velocity: decreased     General Gait Details: Slow cadence but steady without LOB  Stairs            Wheelchair Mobility    Modified Rankin (Stroke Patients Only)       Balance Overall balance assessment: Needs assistance Sitting-balance support: Feet supported Sitting balance-Leahy Scale: Good     Standing balance support: During functional activity, Bilateral upper extremity supported Standing balance-Leahy Scale: Good  Pertinent Vitals/Pain Pain Assessment Pain Assessment: 0-10 Pain Score: 5  Pain Location: back pain, chronic Pain Descriptors / Indicators: Sore Pain Intervention(s): Repositioned, Monitored during session,  Other (comment) (Pt with a pain pump for chronic back pain)    Home Living Family/patient expects to be discharged to:: Private residence Living Arrangements: Children;Other (Comment) (Son) Available Help at Discharge: Family;Available 24 hours/day Type of Home: Mobile home Home Access: Stairs to enter Entrance Stairs-Rails: Right Entrance Stairs-Number of Steps: 5   Home Layout: One level Home Equipment: BSC/3in1;Rolling Walker (2 wheels);Cane - single point Additional Comments: Pt has access to a new BSC if needed    Prior Function Prior Level of Function : Independent/Modified Independent;Driving             Mobility Comments: Mod Ind amb with a SPC for the last two months secondary to L hip pain for which she is scheduled for a THA this June, was ambulating without an AD prior to that, no fall history, community ambulator ADLs Comments: Ind with ADLs     Hand Dominance   Dominant Hand: Right    Extremity/Trunk Assessment   Upper Extremity Assessment Upper Extremity Assessment: Generalized weakness    Lower Extremity Assessment Lower Extremity Assessment: Generalized weakness       Communication   Communication: No difficulties  Cognition Arousal/Alertness: Awake/alert Behavior During Therapy: WFL for tasks assessed/performed Overall Cognitive Status: Within Functional Limits for tasks assessed                                          General Comments      Exercises Other Exercises Other Exercises: HEP education for BLE APs, QS, and GS x 10 each every 1-2 hours daily   Assessment/Plan    PT Assessment Patient needs continued PT services  PT Problem List Decreased strength;Decreased activity tolerance;Decreased balance;Decreased mobility       PT Treatment Interventions DME instruction;Gait training;Stair training;Functional mobility training;Therapeutic activities;Therapeutic exercise;Balance training;Patient/family education    PT  Goals (Current goals can be found in the Care Plan section)  Acute Rehab PT Goals Patient Stated Goal: To be able to walk without a SPC PT Goal Formulation: With patient Time For Goal Achievement: 12/08/22 Potential to Achieve Goals: Good    Frequency Min 3X/week     Co-evaluation               AM-PAC PT "6 Clicks" Mobility  Outcome Measure Help needed turning from your back to your side while in a flat bed without using bedrails?: A Little Help needed moving from lying on your back to sitting on the side of a flat bed without using bedrails?: A Little Help needed moving to and from a bed to a chair (including a wheelchair)?: A Little Help needed standing up from a chair using your arms (e.g., wheelchair or bedside chair)?: A Little Help needed to walk in hospital room?: A Little Help needed climbing 3-5 steps with a railing? : A Little 6 Click Score: 18    End of Session Equipment Utilized During Treatment: Gait belt;Oxygen Activity Tolerance: Patient tolerated treatment well Patient left: in chair;with call bell/phone within reach;with chair alarm set;with family/visitor present Nurse Communication: Mobility status PT Visit Diagnosis: Difficulty in walking, not elsewhere classified (R26.2);Muscle weakness (generalized) (M62.81)    Time: 8119-1478 PT Time Calculation (min) (ACUTE ONLY): 29 min   Charges:  PT Evaluation $PT Eval Moderate Complexity: 1 Mod PT Treatments $Gait Training: 8-22 mins      D. Elly Modena PT, DPT 11/25/22, 3:55 PM

## 2022-11-25 NOTE — Assessment & Plan Note (Deleted)
Continue with oxygen as needed due to above as well as underlying diagnosis of COPD

## 2022-11-25 NOTE — Progress Notes (Signed)
Called for patient is distress with low sats. Pt was on NRB but wanting to pull it off saying "she didn't like the mask on her face". Placed patient on 10L HFNC with sats of 100%. Was able to wean patient back down to 4LPM with sats of 96%. Patient has coarse breath sounds with a HR of 138 and getting short of breath when coughing. Patient has a productive cough. No nebulizer indicated at this time.

## 2022-11-25 NOTE — Assessment & Plan Note (Addendum)
Patient is already rate controlled on metoprolol.  Continue with apixaban as at home

## 2022-11-25 NOTE — ED Notes (Signed)
Inpatient RN assigned to room, waiting for handoff to be completed.

## 2022-11-25 NOTE — Assessment & Plan Note (Signed)
Had diarrhea prior to coming into the hospital.

## 2022-11-25 NOTE — Evaluation (Signed)
Occupational Therapy Evaluation Patient Details Name: Jordan Chan MRN: [id number] DOB: April 19, 1951 Today's Date: 11/25/2022   History of Present Illness 72 y.o. female with medical history significant of COPD on night oxygen, chronic atrial fibrillation, chronic diastolic congestive heart failure, type 2 diabetes, gastroesophageal reflux disease, essential hypertension, dyslipidemia, chronic pain with a pain pump, obstructive sleep apnea, history of stroke, who present to the hospital with worsening shortness of breath, he had significant respiratory distress and hypoxemia, was placed on 5 L oxygen.  Chest x-ray showed left lower lobe pneumonia.  Patient also had a significant vomiting for the last few days, pneumonia probably due to aspiration.  She met sepsis criteria at admission with significant tachycardia, leukocytosis, mild elevation of lactic acid level.   Clinical Impression   Patient presenting with decreased Ind in self care,balance, functional mobility/transfers, endurance, and safety awareness. Patient reports living at home with son and being mod I with use of SPC for mobility and ADLs. Pt does endorse use of 2Ls at night at baseline.   Patient currently functioning at supervision overall in room for short distance mobility and standing at sink for grooming tasks. Pt reports son in available to assist as needed at discharge. Purewick removed and discussed with RN plan for pt to ambulate to bathroom with staff or transfer to [facility] in preparation to return home.  Patient will benefit from acute OT to increase overall independence in the areas of ADLs, functional mobility, and safety awareness in order to safely discharge.     Recommendations for follow up therapy are one component of a multi-disciplinary discharge planning process, led by the attending physician.  Recommendations may be updated based on patient status, additional functional criteria and insurance authorization.    Assistance Recommended at Discharge Intermittent Supervision/Assistance  Patient can return home with the following A little help with walking and/or transfers;A little help with bathing/dressing/bathroom;Assistance with cooking/housework;Assist for transportation;Help with stairs or ramp for entrance    Functional Status Assessment  Patient has had a recent decline in their functional status and demonstrates the ability to make significant improvements in function in a reasonable and predictable amount of time.  Equipment Recommendations  None recommended by OT       Precautions / Restrictions Precautions Precautions: Fall      Mobility Bed Mobility Overal bed mobility: Needs Assistance Bed Mobility: Supine to Sit, Sit to Supine     Supine to sit: Supervision Sit to supine: Supervision   General bed mobility comments: no physical assistance provided but cued for technique    Transfers Overall transfer level: Needs assistance Equipment used: None Transfers: Sit to/from Stand Sit to Stand: Supervision                  Balance Overall balance assessment: Needs assistance Sitting-balance support: Feet supported Sitting balance-Leahy Scale: Good     Standing balance support: During functional activity Standing balance-Leahy Scale: Fair                             ADL either performed or assessed with clinical judgement   ADL Overall ADL's : Needs assistance/impaired     Grooming: Wash/dry hands;Oral care;Standing;Supervision/safety               Lower Body Dressing: Sitting/lateral leans;Supervision/safety Lower Body Dressing Details (indicate cue type and reason): Pt donned B socks without assistance Toilet Transfer: Supervision/safety Toilet Transfer Details (indicate cue type and reason): simulated  Vision Patient Visual Report: No change from baseline              Pertinent Vitals/Pain Pain  Assessment Pain Assessment: No/denies pain     Hand Dominance Right   Extremity/Trunk Assessment Upper Extremity Assessment Upper Extremity Assessment: Generalized weakness   Lower Extremity Assessment Lower Extremity Assessment: Generalized weakness       Communication Communication Communication: No difficulties   Cognition Arousal/Alertness: Awake/alert Behavior During Therapy: WFL for tasks assessed/performed Overall Cognitive Status: Within Functional Limits for tasks assessed                                                  Home Living Family/patient expects to be discharged to:: Private residence Living Arrangements: Children (son) Available Help at Discharge: Family;Available 24 hours/day Type of Home: Mobile home Home Access: Stairs to enter Entrance Stairs-Number of Steps: 5 Entrance Stairs-Rails: Right;Left Home Layout: One level     Bathroom Shower/Tub: Chief Strategy Officer: Handicapped height     Home Equipment: Teacher, English as a foreign language (2 wheels);Cane - single point          Prior Functioning/Environment Prior Level of Function : Independent/Modified Independent;Driving               ADLs Comments: Pt reports ambulation with SPC for mobility and Ind in ADLs with shared IADL responsibilities with son.        OT Problem List: Decreased activity tolerance;Decreased safety awareness;Impaired balance (sitting and/or standing);Decreased knowledge of use of DME or AE;Cardiopulmonary status limiting activity      OT Treatment/Interventions: Self-care/ADL training;Therapeutic exercise;Therapeutic activities;DME and/or AE instruction;Patient/family education;Balance training;Energy conservation    OT Goals(Current goals can be found in the care plan section) Acute Rehab OT Goals Patient Stated Goal: to go home OT Goal Formulation: With patient Time For Goal Achievement: 12/09/22 Potential to Achieve Goals:  Fair ADL Goals Pt Will Perform Grooming: with modified independence;standing Pt Will Perform Lower Body Dressing: with modified independence;sit to/from stand Pt Will Transfer to Toilet: with modified independence;ambulating Pt Will Perform Toileting - Clothing Manipulation and hygiene: with modified independence;sit to/from stand  OT Frequency: Min 2X/week       AM-PAC OT "6 Clicks" Daily Activity     Outcome Measure Help from another person eating meals?: None Help from another person taking care of personal grooming?: None Help from another person toileting, which includes using toliet, bedpan, or urinal?: A Little Help from another person bathing (including washing, rinsing, drying)?: A Little Help from another person to put on and taking off regular upper body clothing?: None Help from another person to put on and taking off regular lower body clothing?: A Little 6 Click Score: 21   End of Session Equipment Utilized During Treatment: Oxygen Nurse Communication: Mobility status  Activity Tolerance: Patient tolerated treatment well Patient left: with call bell/phone within reach;with bed alarm set;with family/visitor present  OT Visit Diagnosis: Unsteadiness on feet (R26.81);Muscle weakness (generalized) (M62.81)                Time: 0981-1914 OT Time Calculation (min): 14 min Charges:  OT General Charges $OT Visit: 1 Visit OT Evaluation $OT Eval Moderate Complexity: 1 Mod OT Treatments $Self Care/Home Management : 8-22 mins Jackquline Denmark, MS, OTR/L , CBIS ascom [phone]  11/25/22, 2:36 PM

## 2022-11-25 NOTE — Assessment & Plan Note (Deleted)
With at least some new hypoxia given that the patient does use nighttime supplementary oxygen but not during daytime.  S/p ceftriaxone and azithromycin in the ER associated with marked leukocytosis and sepsis.  Mild lactic acidosis in the context of metformin use at home.  At this time patient has received half a liter of saline bolus associated with running IV fluids.  I will favor stopping iv fludis in this lady once lactic acid normalises. and continue with ceftriaxone and azithromycin and monitor her hypoxia.  As well as WBC trend

## 2022-11-26 DIAGNOSIS — I4891 Unspecified atrial fibrillation: Secondary | ICD-10-CM | POA: Diagnosis not present

## 2022-11-26 DIAGNOSIS — E872 Acidosis, unspecified: Secondary | ICD-10-CM

## 2022-11-26 DIAGNOSIS — J181 Lobar pneumonia, unspecified organism: Secondary | ICD-10-CM | POA: Diagnosis not present

## 2022-11-26 DIAGNOSIS — E876 Hypokalemia: Secondary | ICD-10-CM

## 2022-11-26 DIAGNOSIS — A419 Sepsis, unspecified organism: Secondary | ICD-10-CM | POA: Diagnosis not present

## 2022-11-26 DIAGNOSIS — K529 Noninfective gastroenteritis and colitis, unspecified: Secondary | ICD-10-CM

## 2022-11-26 DIAGNOSIS — E669 Obesity, unspecified: Secondary | ICD-10-CM

## 2022-11-26 DIAGNOSIS — J441 Chronic obstructive pulmonary disease with (acute) exacerbation: Secondary | ICD-10-CM

## 2022-11-26 DIAGNOSIS — E1165 Type 2 diabetes mellitus with hyperglycemia: Secondary | ICD-10-CM

## 2022-11-26 DIAGNOSIS — R6520 Severe sepsis without septic shock: Secondary | ICD-10-CM

## 2022-11-26 DIAGNOSIS — N1831 Chronic kidney disease, stage 3a: Secondary | ICD-10-CM

## 2022-11-26 DIAGNOSIS — J9601 Acute respiratory failure with hypoxia: Secondary | ICD-10-CM | POA: Diagnosis not present

## 2022-11-26 LAB — CBC
HCT: 34.2 % — ABNORMAL LOW (ref 36.0–46.0)
Hemoglobin: 10.8 g/dL — ABNORMAL LOW (ref 12.0–15.0)
MCH: 30.5 pg (ref 26.0–34.0)
MCHC: 31.6 g/dL (ref 30.0–36.0)
MCV: 96.6 fL (ref 80.0–100.0)
Platelets: 287 10*3/uL (ref 150–400)
RBC: 3.54 MIL/uL — ABNORMAL LOW (ref 3.87–5.11)
RDW: 13.6 % (ref 11.5–15.5)
WBC: 12.2 10*3/uL — ABNORMAL HIGH (ref 4.0–10.5)
nRBC: 0 % (ref 0.0–0.2)

## 2022-11-26 LAB — MAGNESIUM: Magnesium: 1.7 mg/dL (ref 1.7–2.4)

## 2022-11-26 LAB — BASIC METABOLIC PANEL
Anion gap: 7 (ref 5–15)
BUN: 18 mg/dL (ref 8–23)
CO2: 28 mmol/L (ref 22–32)
Calcium: 8.5 mg/dL — ABNORMAL LOW (ref 8.9–10.3)
Chloride: 100 mmol/L (ref 98–111)
Creatinine, Ser: 1.05 mg/dL — ABNORMAL HIGH (ref 0.44–1.00)
GFR, Estimated: 56 mL/min — ABNORMAL LOW (ref >60)
Glucose, Bld: 99 mg/dL (ref 70–99)
Potassium: 3.4 mmol/L — ABNORMAL LOW (ref 3.5–5.1)
Sodium: 135 mmol/L (ref 135–145)

## 2022-11-26 LAB — GLUCOSE, CAPILLARY
Glucose-Capillary: 55 mg/dL — ABNORMAL LOW (ref 70–99)
Glucose-Capillary: 177 mg/dL — ABNORMAL HIGH (ref 70–99)
Glucose-Capillary: 148 mg/dL — ABNORMAL HIGH (ref 70–99)
Glucose-Capillary: 274 mg/dL — ABNORMAL HIGH (ref 70–99)
Glucose-Capillary: 240 mg/dL — ABNORMAL HIGH (ref 70–99)

## 2022-11-26 MED ORDER — SODIUM CHLORIDE 0.9 % IV SOLN
3.0000 g | Freq: Four times a day (QID) | INTRAVENOUS | Status: AC
  Administered 2022-11-26 – 2022-11-30 (×18): 3 g via INTRAVENOUS
  Filled 2022-11-26 (×2): qty 8
  Filled 2022-11-26: qty 3
  Filled 2022-11-26: qty 8
  Filled 2022-11-26: qty 3
  Filled 2022-11-26 (×5): qty 8
  Filled 2022-11-26 (×2): qty 3
  Filled 2022-11-26: qty 8
  Filled 2022-11-26 (×5): qty 3
  Filled 2022-11-26: qty 8

## 2022-11-26 MED ORDER — POTASSIUM CHLORIDE CRYS ER 20 MEQ PO TBCR
20.0000 meq | EXTENDED_RELEASE_TABLET | Freq: Once | ORAL | Status: AC
  Administered 2022-11-26: 20 meq via ORAL
  Filled 2022-11-26: qty 1

## 2022-11-26 MED ORDER — LEVALBUTEROL HCL 0.63 MG/3ML IN NEBU
0.6300 mg | INHALATION_SOLUTION | Freq: Three times a day (TID) | RESPIRATORY_TRACT | Status: DC
  Administered 2022-11-26: 0.63 mg via RESPIRATORY_TRACT
  Filled 2022-11-26 (×2): qty 3

## 2022-11-26 MED ORDER — ARFORMOTEROL TARTRATE 15 MCG/2ML IN NEBU
15.0000 ug | INHALATION_SOLUTION | Freq: Two times a day (BID) | RESPIRATORY_TRACT | Status: DC
  Administered 2022-11-26 – 2022-12-01 (×10): 15 ug via RESPIRATORY_TRACT
  Filled 2022-11-26 (×12): qty 2

## 2022-11-26 MED ORDER — MAGNESIUM OXIDE -MG SUPPLEMENT 400 (240 MG) MG PO TABS
200.0000 mg | ORAL_TABLET | Freq: Every day | ORAL | Status: DC
  Administered 2022-11-26 – 2022-12-01 (×6): 200 mg via ORAL
  Filled 2022-11-26 (×6): qty 1

## 2022-11-26 MED ORDER — BUDESONIDE 0.25 MG/2ML IN SUSP
0.2500 mg | Freq: Two times a day (BID) | RESPIRATORY_TRACT | Status: DC
  Administered 2022-11-26 – 2022-12-01 (×11): 0.25 mg via RESPIRATORY_TRACT
  Filled 2022-11-26 (×11): qty 2

## 2022-11-26 MED ORDER — GUAIFENESIN 100 MG/5ML PO LIQD: 5.0000 mL | ORAL | Status: DC | PRN

## 2022-11-26 MED ORDER — BENZONATATE 100 MG PO CAPS
100.0000 mg | ORAL_CAPSULE | Freq: Three times a day (TID) | ORAL | Status: DC
  Administered 2022-11-26 – 2022-12-01 (×16): 100 mg via ORAL
  Filled 2022-11-26 (×16): qty 1

## 2022-11-26 MED ORDER — LEVALBUTEROL HCL 0.63 MG/3ML IN NEBU
0.6300 mg | INHALATION_SOLUTION | Freq: Two times a day (BID) | RESPIRATORY_TRACT | Status: DC
  Administered 2022-11-26 – 2022-12-01 (×10): 0.63 mg via RESPIRATORY_TRACT
  Filled 2022-11-26 (×11): qty 3

## 2022-11-26 MED ORDER — METHYLPREDNISOLONE SODIUM SUCC 40 MG IJ SOLR
40.0000 mg | Freq: Every day | INTRAMUSCULAR | Status: DC
  Administered 2022-11-26 – 2022-12-01 (×6): 40 mg via INTRAVENOUS
  Filled 2022-11-26 (×6): qty 1

## 2022-11-26 NOTE — Assessment & Plan Note (Addendum)
 Hemoglobin A1c of 8.5.  Patient had a low sugar the other day and her insulin  was decreased at night.  Advise she must eat an evening snack.  Can go back on her usual dose of Trulicity  at home.  Advise she must eat an evening snack.

## 2022-11-26 NOTE — Progress Notes (Signed)
PHARMACY NOTE:  ANTIMICROBIAL RENAL DOSAGE ADJUSTMENT  Current antimicrobial regimen includes a mismatch between antimicrobial dosage and estimated renal function.  As per policy approved by the Pharmacy & Therapeutics and Medical Executive Committees, the antimicrobial dosage will be adjusted accordingly.  Current antimicrobial dosage:  Unasyn 3 grams IV every 8 hours  Indication: Aspiration pneumonia  Renal Function:  Estimated Creatinine Clearance: 40.4 mL/min (A) (by C-G formula based on SCr of 1.05 mg/dL (H)).      On intermittent HD, scheduled:      On CRRT    Antimicrobial dosage has been changed to:  Unasyn 3 grams IV every 6 hours  Additional comments:   Thank you for allowing pharmacy to be a part of this patient's care.  Barrie Folk, [facility] 11/26/2022 10:30 AM

## 2022-11-26 NOTE — Progress Notes (Signed)
Progress Note   Patient: Jordan Chan NFA:[id number] DOB: 12-24-1950 DOA: 11/24/2022     1 DOS: the patient was seen and examined on 11/26/2022   Brief hospital course: Jordan Chan is a 72 y.o. female with medical history significant of COPD on night oxygen, chronic atrial fibrillation, chronic diastolic congestive heart failure, type 2 diabetes, gastroesophageal reflux disease, essential hypertension, dyslipidemia, chronic pain with a pain pump, obstructive sleep apnea, history of stroke, who present to the hospital with worsening shortness of breath, he had significant respiratory distress and hypoxemia, was placed on 5 L oxygen.  Chest x-ray showed left lower lobe pneumonia.  Patient also had a significant vomiting for the last few days, pneumonia probably due to aspiration.  She met sepsis criteria at admission with significant tachycardia, leukocytosis, mild elevation of lactic acid level. Antibiotic changed to Unasyn for aspiration pneumonia after admission.  4/24.  With bronchospasm and wheezing started Solu-Medrol.  Switch her inhalers over to nebulizers.  Able to switch from high flow nasal cannula 4 L over the regular nasal cannula 3 L.  Assessment and Plan: * Acute hypoxemic respiratory failure Patient on high flow nasal cannula 4 L this morning.  Able to switch over to regular nasal cannula 3 L.  Patient only wears oxygen at night.  Patient did have a pulse ox of 87% on room air on 4/22.  Lobar pneumonia Left lobar pneumonia.  Patient on Unasyn.  Sepsis Severe sepsis present on admission, leukocytosis, acute respiratory failure, tachycardia and lactic acidosis.  Continue IV antibiotics.  COPD with acute exacerbation With bronchospasm today, and Solu-Medrol 40 mg IV daily and switch inhalers over to nebulizer treatment standing dose.  Hypokalemia Replace oral potassium and oral magnesium.  Atrial fibrillation with RVR Patient is already rate controlled.  Continue  with apixaban as at home  Uncontrolled type 2 diabetes mellitus with hyperglycemia, without long-term current use of insulin Hemoglobin A1c of 8.5.  With adding steroids sugars will likely be higher.  CKD stage 3a, GFR 45-59 ml/min Creatinine 1.05 with a GFR of 56  Hyponatremia Resolved  Gastroenteritis Had diarrhea prior to coming into the hospital.  Obesity (BMI 30-39.9) BMI 31.23        Subjective: Patient not feeling good today.  More cough and shortness of breath.  Was on high flow nasal cannula 4 L this morning and switched over to 3 L regular nasal cannula.  Physical Exam: Vitals:   11/25/22 1710 11/25/22 2258 11/25/22 2357 11/26/22 0751  BP: (!) 98/50 (!) 106/57 (!) 103/51 113/64  Pulse: 84 87 79 84  Resp: Temp: 98.4 F (36.9 C)  98 F (36.7 C) 98.1 F (36.7 C)  TempSrc: Oral     SpO2: 99%  98% 95%  Weight:      Height:       Physical Exam HENT:     Head: Normocephalic.     Mouth/Throat:     Pharynx: No oropharyngeal exudate.  Eyes:     General: Lids are normal.     Conjunctiva/sclera: Conjunctivae normal.  Cardiovascular:     Rate and Rhythm: Normal rate and regular rhythm.     Heart sounds: Normal heart sounds, S1 normal and S2 normal.  Pulmonary:     Breath sounds: Examination of the right-upper field reveals wheezing. Examination of the left-upper field reveals wheezing. Examination of the right-middle field reveals decreased breath sounds and wheezing. Examination of the left-middle field reveals decreased breath sounds  and wheezing. Examination of the right-lower field reveals decreased breath sounds and rhonchi. Examination of the left-lower field reveals decreased breath sounds and rhonchi. Decreased breath sounds, wheezing and rhonchi present.  Abdominal:     Palpations: Abdomen is soft.     Tenderness: There is no abdominal tenderness.  Musculoskeletal:     Right lower leg: No swelling.     Left lower leg: No swelling.  Skin:     General: Skin is warm.     Findings: No rash.  Neurological:     Mental Status: She is alert and oriented to person, place, and time.     Data Reviewed: Potassium 3.4, creatinine 1.05, magnesium 1.7, hemoglobin 10.8, white blood cell count 12.2, platelet count 287  Family Communication: Spoke with husband at the bedside  Disposition: Status is: Inpatient Remains inpatient appropriate because: Switched from high flow nasal cannula to regular nasal cannula today.  Does not wear oxygen at home.  Starting Solu-Medrol today secondary to bronchospasm and wheeze.  Planned Discharge Destination: Home with Home Health    Time spent: 28 minutes  Author: Jerritt Cardoza Alford Highland4/24/2024 12:48 PM  For on call review [email or web address].

## 2022-11-26 NOTE — Assessment & Plan Note (Addendum)
Left lobar pneumonia.  Patient on Unasyn.  Continue Zithromax.

## 2022-11-26 NOTE — Progress Notes (Addendum)
Physical Therapy Treatment Patient Details Name: Jordan Chan MRN: [id number] DOB: 19-Nov-1950 Today's Date: 11/26/2022   History of Present Illness Pt is a 72 y.o. female with medical history significant of COPD on night oxygen, chronic atrial fibrillation, chronic diastolic congestive heart failure, type 2 diabetes, gastroesophageal reflux disease, essential hypertension, dyslipidemia, chronic back pain with a pain pump, obstructive sleep apnea, history of stroke, who present to the hospital with worsening shortness of breath. MD assessment includes: left lower lobe aspiration pneumonia, sepsis secondary to pneumonia, and acute hypoxemic respiratory failure secondary to aspiration pneumonia and COPD exacerbation.    PT Comments    Pt was long sitting in bed upon arrival. She is A and O x 4 and agreeable to session. On 3 L o2 throughout. Author questions mobile/portable pulse oximeter reading during session. With dynamap (accurate pleth) pt's sao2 > 90%. She ambulated the loop around RN station without difficulty or LOB. Slight SOB but overall no symptoms of lightheadedness or dizziness throughout. Once returned to room, discussed importance of stretching strengthening for torticollis treatment. She was issued green theraband at conclusion of session for horizontal shoulder abduction. Overall pt is eager to return home at DC. Acute PT will continue to follow per current POC progressing as able.    Recommendations for follow up therapy are one component of a multi-disciplinary discharge planning process, led by the attending physician.  Recommendations may be updated based on patient status, additional functional criteria and insurance authorization.     Assistance Recommended at Discharge Intermittent Supervision/Assistance  Patient can return home with the following A little help with walking and/or transfers;A little help with bathing/dressing/bathroom;Assist for transportation;Help with  stairs or ramp for entrance   Equipment Recommendations  None recommended by PT       Precautions / Restrictions Precautions Precautions: Fall Restrictions Weight Bearing Restrictions: No     Mobility  Bed Mobility Overal bed mobility: Modified Independent Bed Mobility: Supine to Sit, Sit to Supine Supine to sit: Supervision Sit to supine: Supervision     Transfers Overall transfer level: Needs assistance Equipment used: Straight cane Transfers: Sit to/from Stand Sit to Stand: Modified independent (Device/Increase time)     Ambulation/Gait Ambulation/Gait assistance: Supervision Gait Distance (Feet): 200 Feet Assistive device: Straight cane Gait Pattern/deviations: Step-through pattern, Decreased step length - right, Decreased step length - left, Trunk flexed Gait velocity: decreased     General Gait Details: Pt demonstrated safe ability to ambulate 200 ft with 3L o2 donned. poor pleth reading throughout ambulation in the hallway. With dynamap reading with good pleth, sao2 > 90%. no symptoms of dizziness or lightheadedness throughout session    Balance Overall balance assessment: Needs assistance Sitting-balance support: Feet supported Sitting balance-Leahy Scale: Good     Standing balance support: During functional activity, Bilateral upper extremity supported Standing balance-Leahy Scale: Good       Cognition Arousal/Alertness: Awake/alert Behavior During Therapy: WFL for tasks assessed/performed Overall Cognitive Status: Within Functional Limits for tasks assessed      General Comments: Pt is A and O x 4               Pertinent Vitals/Pain Pain Assessment Pain Assessment: 0-10 Pain Score: 3  Pain Location: back pain/ neck pain Pain Descriptors / Indicators: Sore Pain Intervention(s): Limited activity within patient's tolerance, Monitored during session, Premedicated before session, Repositioned     PT Goals (current goals can now be found in  the care plan section) Acute Rehab PT Goals Patient Stated  Goal: go home tomorrow Progress towards PT goals: Progressing toward goals    Frequency    Min 3X/week      PT Plan Current plan remains appropriate       AM-PAC PT "6 Clicks" Mobility   Outcome Measure  Help needed turning from your back to your side while in a flat bed without using bedrails?: A Little Help needed moving from lying on your back to sitting on the side of a flat bed without using bedrails?: A Little Help needed moving to and from a bed to a chair (including a wheelchair)?: A Little Help needed standing up from a chair using your arms (e.g., wheelchair or bedside chair)?: A Little Help needed to walk in hospital room?: A Little Help needed climbing 3-5 steps with a railing? : A Little 6 Click Score: 18    End of Session Equipment Utilized During Treatment: Oxygen Activity Tolerance: Patient tolerated treatment well Patient left: in bed;with call bell/phone within reach;with bed alarm set Nurse Communication: Mobility status PT Visit Diagnosis: Difficulty in walking, not elsewhere classified (R26.2);Muscle weakness (generalized) (M62.81)     Time: 9604-5409 PT Time Calculation (min) (ACUTE ONLY): 26 min  Charges:  $Gait Training: 8-22 mins $Therapeutic Activity: 8-22 mins                    Jetta Lout PTA 11/26/22, 3:36 PM

## 2022-11-26 NOTE — Progress Notes (Signed)
PT Cancellation Note  Patient Details Name: Jordan DEMCHAKN: [id number] DOB: May 05, 1951   Cancelled Treatment:     PT attempt. Pt's current blood sugar 55. Will wait to see pt after breakfast and continue to follow per current POC. Pt does endorse getting up to BR this morning but was very SOB and took several minutes to control shortness of breath after exertion. Pt will continue to progress pt to PLOF while maximizing activity tolerance as able.     Rushie Chestnut 11/26/2022, 7:59 AM

## 2022-11-26 NOTE — Assessment & Plan Note (Addendum)
Continue Solu-Medrol 40 mg IV daily and nebulizer treatments.  Incentive spirometer.  Switch over to a prednisone taper upon going home since she was slow to improve during the hospital course.

## 2022-11-26 NOTE — Assessment & Plan Note (Addendum)
Creatinine down to 0.89 with a GFR of 60

## 2022-11-26 NOTE — Assessment & Plan Note (Signed)
BMI 31.23

## 2022-11-26 NOTE — Assessment & Plan Note (Addendum)
Replaced. °

## 2022-11-26 NOTE — Assessment & Plan Note (Deleted)
Severe sepsis present on admission, leukocytosis, acute respiratory failure, tachycardia and lactic acidosis.  Continue IV Unasyn and add oral Zithromax.

## 2022-11-26 NOTE — Progress Notes (Signed)
OT Cancellation Note  Patient Details Name: Jordan Chan MRN: [id number] DOB: 1951/07/28   Cancelled Treatment:    Reason Eval/Treat Not Completed: Fatigue/lethargy limiting ability to participate. Pt stating she was just up with PT not long ago, and also has had nursing in the room getting new IV. States she is fatigued. Able to explain energy conservation strategies she uses at home. Very pleasant and appreciative of OT attempted visit. Agreeable to OT coming again tomorrow.   Alvester Morin 11/26/2022, 2:07 PM

## 2022-11-26 NOTE — Assessment & Plan Note (Signed)
Resolved

## 2022-11-26 NOTE — Progress Notes (Signed)
Speech Language Pathology Treatment: Dysphagia  Patient Details Name: Jordan Chan MRN: [id number] DOB: [birth year] Today's Date: 11/26/2022 Time: 1210-1250 SLP Time Calculation (min) (ACUTE ONLY): 40 min  Assessment / Plan / Recommendation Clinical Impression  Pt seen for ongoing assessment of swallowing; toleration of diet today. Pt sitting in bed as she finished her lunch meal(>75% eaten). She was verbal, A/O x4. Family present in room. Noted increased Talking and Dry Cough at Baseline.  OF NOTE pt has a previous BSEs in 2023 - mech soft diet, thins rec'd then. At previous BSE, it was noted that pt exhibited a Dry Cough during oral intake of evaluation at Baseline then -- "low suspicion for relation to intake given delayed timing and dry nature without persistence given continued trials. Patient demonstrated functional oropharyngeal phase swallowing". On [place] O2 support 3L trending down; afebrile. O2 at home at night.     Patient continue to present w/ functional oropharyngeal swallow as demonstrated by no immediate, overt clinical s/s aspiration across multiple trials of thin liquids and solid trials today. Pt appears to present w/ adequate oropharyngeal phase swallow w/ No oropharyngeal phase dysphagia noted, No neuromuscular deficits noted. Pt appears at reduced risk for aspiration when following general aspiration precautions and following strategies for conservation of energy.    However, pt does have challenging factors that could impact oropharyngeal swallowing to include Baseline Pulmonary decline w/ ease of WOB w/ exertion including increased talking/moving about in bed, deconditioned status/weakness, and acute illness including recent Vomiting w/ concern for aspiration, pneumonia resulting from it. These factors can increase risk for dysphagia, aspiration as well as decreased oral intake overall.    During po trials, pt consumed foods and liquids from meal w/ no immediate, overt  coughing, decline in vocal quality, or change in respiratory presentation during/post trials. A dry cough occurred between trials of thin liquids x1 but not immediate to the swallow and did not increase as pt continued oral intake. There is low suspicion for relation to the oral intake given delayed timing and dry nature and without persistence given continued trials. Patient demonstrated functional oral phase with adequate containment and management, timely preparation and A-P transfer, and complete oral clearance.   Patient denied difficulty or changes to swallowing; Family present in room denied pt having any difficulty swallowing during meals. NSG reported no difficulty this morning w/ breakfast meal nor when swallowing Pills.     Recommend continue a fairly Regular consistency diet(as ordered by MD) w/ well-Cut meats, moistened foods for conservation of energy; Thin liquids -- carefully monitor any straw use and use Cup for best control when drinking if needed. Recommend general aspiration precautions, Rest Breaks during meals for conservation of energy in setting of Baseline Pulmonary decline/COPD. Pills WHOLE in Puree for safer, easier swallowing -- as needed. Pt/NSG agreed.    This SLP educated patient and family re: elevated risk of aspiration/development of dysphagia in setting of COPD therefore aspiration precautions are recommended. Education given on Pills in Puree; food consistencies and easy to eat options; LESS TALKING DURING MEALS/INTAKE; single sips slowly w/ rest breaks during meals to calm breathing to pt and Family present. Pt and Family agreed. NSG updated, agreed. MD updated. Recommend Dietician f/u for support. Recommend monitoring status post d/c home w/ PCP if any concern for dysphagia develops. No further skilled ST services indicated. MD to reconsult if any changes during this admit. Pt agreed. MD updated.     HPI HPI: Pt is a 72 y.o.  female with medical history significant of  COPD on night oxygen, chronic atrial fibrillation, chronic diastolic congestive heart failure, type 2 diabetes, GERD/REFLUX, essential hypertension, dyslipidemia, chronic pain with a pain pump, obstructive sleep apnea, history of stroke, who present to the hospital with worsening shortness of breath, she had significant respiratory distress and hypoxemia, was placed on 5 L oxygen. Patient also had a significant Vomiting for the last few days, pneumonia probably due to aspiration of vomit.  Chest x-ray showed left lower lobe pneumonia. Previous CXR in 07/2022: No active disease.   She met sepsis criteria at admission with significant tachycardia, leukocytosis, mild elevation of lactic acid level.  Pt stated she has declined Pulmonary status at baseline requiring her to sleep in a recliner at home "since 1998" for better breathing.      SLP Plan  All goals met      Recommendations for follow up therapy are one component of a multi-disciplinary discharge planning process, led by the attending physician.  Recommendations may be updated based on patient status, additional functional criteria and insurance authorization.    Recommendations  Diet recommendations: Dysphagia 3 (mechanical soft);Regular;Thin liquid (cut, moisteneed foods) Liquids provided via: Cup;Straw (monitor for single sips slowly) Medication Administration: Whole meds with puree (IF needed for ease/safety of swallowing) Supervision: Patient able to self feed (setup) Compensations: Minimize environmental distractions;Slow rate;Small sips/bites;Lingual sweep for clearance of pocketing;Follow solids with liquid (REST BREAKS and LESS TALKING DURING MEALS) Postural Changes and/or Swallow Maneuvers: Out of bed for meals;Seated upright 90 degrees;Upright 30-60 min after meal (reflux precautions)                 (Dietician f/u) Oral care BID;Oral care before and after PO;Staff/trained caregiver to provide oral care (denture care;  setup)   Set up Supervision/Assistance Dysphagia, unspecified (R13.10)     All goals met       Jerilynn Som, MS, CCC-SLP Speech Language Pathologist Rehab Services; [facility] [phone] (ascom) Jordan Chan  11/26/2022, 4:18 PM

## 2022-11-26 NOTE — Assessment & Plan Note (Addendum)
 Patient on high flow nasal cannula 4 L on 4/24.  Patient did have a pulse ox of 87% on room air on 4/22.  Patient desaturated down to 83% on 4/28.  Today patient able to hold her saturations with ambulating.  Patient feeling well enough to go home.  Patient does wear chronic oxygen  at night only.  Able to come off oxygen  during the day.

## 2022-11-27 DIAGNOSIS — E871 Hypo-osmolality and hyponatremia: Secondary | ICD-10-CM

## 2022-11-27 DIAGNOSIS — A419 Sepsis, unspecified organism: Secondary | ICD-10-CM | POA: Diagnosis not present

## 2022-11-27 DIAGNOSIS — J181 Lobar pneumonia, unspecified organism: Secondary | ICD-10-CM | POA: Diagnosis not present

## 2022-11-27 DIAGNOSIS — J9601 Acute respiratory failure with hypoxia: Secondary | ICD-10-CM | POA: Diagnosis not present

## 2022-11-27 DIAGNOSIS — J441 Chronic obstructive pulmonary disease with (acute) exacerbation: Secondary | ICD-10-CM | POA: Diagnosis not present

## 2022-11-27 LAB — GLUCOSE, CAPILLARY
Glucose-Capillary: 191 mg/dL — ABNORMAL HIGH (ref 70–99)
Glucose-Capillary: 208 mg/dL — ABNORMAL HIGH (ref 70–99)
Glucose-Capillary: 215 mg/dL — ABNORMAL HIGH (ref 70–99)
Glucose-Capillary: 254 mg/dL — ABNORMAL HIGH (ref 70–99)

## 2022-11-27 NOTE — Progress Notes (Signed)
OT Cancellation Note  Patient Details Name: Jordan Chan MRN: [id number] DOB: [birth year]   Cancelled Treatment:    Reason Eval/Treat Not Completed: Fatigue/lethargy limiting ability to participate;Other (comment) (Pt request OT sign off).  Pt pleasant and stating she feels fatigued from just working with PT.  Pt states she has family to assist with IADLs. We discussed ADLs and she feels confident in ADLs, but needs a BSC since her bathroom is at the opposite end of the house from the bedroom where she will be staying. OT to communicate with TOC. OT discussed shower chair for energy conservation; a family member in the room says that they have on from someone else that pt can use. Pt voices no other needs or concerns. Denies need for HHOT or HHPT. OT communication with PT and TOC. Pt declines further OT in acute care; OT will sign off.   Alvester Morin 11/27/2022, 3:17 PM

## 2022-11-27 NOTE — Progress Notes (Signed)
Physical Therapy Treatment Patient Details Name: Jordan Chan MRN: [id number] DOB: 10/28/1950 Today's Date: 11/27/2022   History of Present Illness Pt is a 72 y.o. female with medical history significant of COPD on night oxygen, chronic atrial fibrillation, chronic diastolic congestive heart failure, type 2 diabetes, gastroesophageal reflux disease, essential hypertension, dyslipidemia, chronic back pain with a pain pump, obstructive sleep apnea, history of stroke, who present to the hospital with worsening shortness of breath. MD assessment includes: left lower lobe aspiration pneumonia, sepsis secondary to pneumonia, and acute hypoxemic respiratory failure secondary to aspiration pneumonia and COPD exacerbation.    PT Comments    Patient received in bed, multiple family members in room. Patient is agreeable to PT session. She is mod I with bed mobility and transfers. Ambulated 200 feet with RW and supervision. No lob, no difficulty noted. Patient will need stair practice prior to discharge. She will continue to benefit from skilled PT to improve safety and functional independence.       Recommendations for follow up therapy are one component of a multi-disciplinary discharge planning process, led by the attending physician.  Recommendations may be updated based on patient status, additional functional criteria and insurance authorization.  Follow Up Recommendations       Assistance Recommended at Discharge PRN  Patient can return home with the following Help with stairs or ramp for entrance   Equipment Recommendations  None recommended by PT    Recommendations for Other Services       Precautions / Restrictions Precautions Precaution Comments: mod fall Restrictions Weight Bearing Restrictions: No     Mobility  Bed Mobility Overal bed mobility: Modified Independent Bed Mobility: Supine to Sit, Sit to Supine     Supine to sit: Modified independent (Device/Increase  time) Sit to supine: Modified independent (Device/Increase time)        Transfers Overall transfer level: Independent Equipment used: None Transfers: Sit to/from Stand Sit to Stand: Independent                Ambulation/Gait Ambulation/Gait assistance: Supervision Gait Distance (Feet): 200 Feet Assistive device: Rolling walker (2 wheels) Gait Pattern/deviations: Step-through pattern Gait velocity: WNL     General Gait Details: safe ambulation with RW. No significant sob.   Stairs             Wheelchair Mobility    Modified Rankin (Stroke Patients Only)       Balance Overall balance assessment: Modified Independent Sitting-balance support: Feet supported Sitting balance-Leahy Scale: Normal     Standing balance support: Bilateral upper extremity supported, During functional activity, Reliant on assistive device for balance Standing balance-Leahy Scale: Good Standing balance comment: able to ambulate short distances without AD, or with SPC.                            Cognition Arousal/Alertness: Awake/alert Behavior During Therapy: WFL for tasks assessed/performed Overall Cognitive Status: Within Functional Limits for tasks assessed                                          Exercises      General Comments        Pertinent Vitals/Pain Pain Assessment Pain Assessment: No/denies pain    Home Living  Prior Function            PT Goals (current goals can now be found in the care plan section) Acute Rehab PT Goals Patient Stated Goal: to return home PT Goal Formulation: With patient Time For Goal Achievement: 12/08/22 Potential to Achieve Goals: Good Progress towards PT goals: Progressing toward goals    Frequency    Min 2X/week      PT Plan Discharge plan needs to be updated;Frequency needs to be updated    Co-evaluation              AM-PAC PT "6 Clicks"  Mobility   Outcome Measure  Help needed turning from your back to your side while in a flat bed without using bedrails?: None Help needed moving from lying on your back to sitting on the side of a flat bed without using bedrails?: None Help needed moving to and from a bed to a chair (including a wheelchair)?: None Help needed standing up from a chair using your arms (e.g., wheelchair or bedside chair)?: None Help needed to walk in hospital room?: None Help needed climbing 3-5 steps with a railing? : A Little 6 Click Score: 23    End of Session Equipment Utilized During Treatment: Oxygen Activity Tolerance: Patient tolerated treatment well Patient left: in bed;with call bell/phone within reach;with family/visitor present Nurse Communication: Mobility status PT Visit Diagnosis: Difficulty in walking, not elsewhere classified (R26.2)     Time: 1430-1440 PT Time Calculation (min) (ACUTE ONLY): 10 min  Charges:  $Gait Training: 8-22 mins                     Jerod Mcquain, PT, GCS 11/27/22,2:51 PM

## 2022-11-27 NOTE — TOC Progression Note (Signed)
Transition of Care [facility]) - Progression Note    Patient Details  Name: Jordan Chan MRN: [id number] Date of Birth: October 16, 1950  [facility]) CM/SW Contact  Marlowe Sax, RN Phone Number: 11/27/2022, 3:47 PM  Clinical Narrative:     3 in 1 to be delivered to the bedside by [employer]  Expected Discharge Plan: Home w Home Health Services Barriers to Discharge: Continued Medical Work up  Expected Discharge Plan and Services   Discharge Planning Services: CM Consult   Living arrangements for the past 2 months: Mobile Home                 DME Arranged: N/A         HH Arranged: PT, OT HH Agency: [facility] Date [facility] Agency Contacted: 11/25/22 Time HH Agency Contacted: 1513 Representative spoke with at [facility] Agency: [place]   Social Determinants of Health (SDOH) Interventions SDOH Screenings   Food Insecurity: No Food Insecurity (11/26/2022)  Housing: Low Risk  (11/26/2022)  Transportation Needs: No Transportation Needs (11/26/2022)  Utilities: Not At Risk (11/26/2022)  Alcohol Screen: Low Risk  (12/16/2021)  Depression (PHQ2-9): Medium Risk (09/24/2022)  Financial Resource Strain: Low Risk  (06/16/2022)  Recent Concern: Financial Resource Strain - Medium Risk (05/23/2022)  Physical Activity: Insufficiently Active (12/16/2021)  Social Connections: Moderately Isolated (12/16/2021)  Stress: No Stress Concern Present (12/16/2021)  Tobacco Use: Medium Risk (11/24/2022)    Readmission Risk Interventions     No data to display

## 2022-11-27 NOTE — Progress Notes (Addendum)
Patient is not able to walk the distance required to go the bathroom, or he/she is unable to safely negotiate stairs required to access the bathroom.  A 3in1 BSC will alleviate this problem  

## 2022-11-27 NOTE — Plan of Care (Signed)

## 2022-11-27 NOTE — Progress Notes (Signed)
Progress Note   Patient: Jordan Chan WJX:[id number] DOB: May 09, 1951 DOA: 11/24/2022     2 DOS: the patient was seen and examined on 11/27/2022   Brief hospital course: Jordan Chan is a 72 y.o. female with medical history significant of COPD on night oxygen, chronic atrial fibrillation, chronic diastolic congestive heart failure, type 2 diabetes, gastroesophageal reflux disease, essential hypertension, dyslipidemia, chronic pain with a pain pump, obstructive sleep apnea, history of stroke, who present to the hospital with worsening shortness of breath, he had significant respiratory distress and hypoxemia, was placed on 5 L oxygen.  Chest x-ray showed left lower lobe pneumonia.  Patient also had a significant vomiting for the last few days, pneumonia probably due to aspiration.  She met sepsis criteria at admission with significant tachycardia, leukocytosis, mild elevation of lactic acid level. Antibiotic changed to Unasyn for aspiration pneumonia after admission.  4/24.  With bronchospasm and wheezing started Solu-Medrol.  Switch her inhalers over to nebulizers.  Able to switch from high flow nasal cannula 4 L over the regular nasal cannula 3 L. 4/25.  Continue Solu-Medrol with bronchospasm.  Assessment and Plan: * Acute hypoxemic respiratory failure Patient on high flow nasal cannula 4 L yesterday morning.  Able to switch over to regular nasal cannula 3 L.  Patient only wears oxygen at night.  Patient did have a pulse ox of 87% on room air on 4/22.  Try to taper off oxygen.  Lobar pneumonia Left lobar pneumonia.  Patient on Unasyn.  Sepsis Severe sepsis present on admission, leukocytosis, acute respiratory failure, tachycardia and lactic acidosis.  Continue IV antibiotics.  COPD with acute exacerbation With bronchospasm today, continue Solu-Medrol 40 mg IV daily and nebulizer treatment standing dose.  Hypokalemia Replace oral potassium and oral magnesium.  Atrial  fibrillation with RVR Patient is already rate controlled.  Continue with apixaban as at home  Uncontrolled type 2 diabetes mellitus with hyperglycemia, without long-term current use of insulin Hemoglobin A1c of 8.5.  With adding steroids sugars will likely be higher.  Patient on Semglee insulin 55 units at bedtime plus sliding scale insulin.  CKD stage 3a, GFR 45-59 ml/min Creatinine 1.05 with a GFR of 56  Hyponatremia Resolved  Gastroenteritis Had diarrhea prior to coming into the hospital.  Obesity (BMI 30-39.9) BMI 31.23        Subjective: Patient still with shortness of breath and cough.  Was on 3 L of oxygen this morning.  She does not wear oxygen during the day.  Admitted with acute respiratory failure and pneumonia.  Physical Exam: Vitals:   11/26/22 2047 11/26/22 2244 11/27/22 0732 11/27/22 0747  BP:  (!) 112/55  (!) 119/54  Pulse:  84  78  Resp:  20  16  Temp:  97.6 F (36.4 C)  98.2 F (36.8 C)  TempSrc:      SpO2: 97% 99% 96% 97%  Weight:      Height:       Physical Exam HENT:     Head: Normocephalic.     Mouth/Throat:     Pharynx: No oropharyngeal exudate.  Eyes:     General: Lids are normal.     Conjunctiva/sclera: Conjunctivae normal.  Cardiovascular:     Rate and Rhythm: Normal rate and regular rhythm.     Heart sounds: Normal heart sounds, S1 normal and S2 normal.  Pulmonary:     Breath sounds: Examination of the right-middle field reveals decreased breath sounds and wheezing. Examination of the left-middle  field reveals decreased breath sounds and wheezing. Examination of the right-lower field reveals decreased breath sounds and rhonchi. Examination of the left-lower field reveals decreased breath sounds and rhonchi. Decreased breath sounds, wheezing and rhonchi present.  Abdominal:     Palpations: Abdomen is soft.     Tenderness: There is no abdominal tenderness.  Musculoskeletal:     Right lower leg: No swelling.     Left lower leg: No  swelling.  Skin:    General: Skin is warm.     Findings: No rash.  Neurological:     Mental Status: She is alert and oriented to person, place, and time.     Data Reviewed: Glucose 208  Family Communication: Updated husband yesterday  Disposition: Status is: Inpatient Remains inpatient appropriate because: Still with bronchospasm.  Continue Solu-Medrol IV and nebulizer treatments.  Still trying to get off oxygen prior to disposition.  Planned Discharge Destination: Home with Home Health    Time spent: 28 minutes  Author: Alford Highland, MD 11/27/2022 2:33 PM  For on call review [email or web address].

## 2022-11-27 NOTE — TOC Progression Note (Signed)
Transition of Care [facility]) - Progression Note    Patient Details  Name: Jordan Chan MRN: [id number] Date of Birth: 01/05/1951  Transition of [facility]) CM/SW Contact  Marlowe Sax, RN Phone Number: 11/27/2022, 2:41 PM  Clinical Narrative:    TOC continues to follow the patient for needs, [facility] accepted the patient for [facility]   Expected Discharge Plan: Home w Home Health Services Barriers to Discharge: Continued Medical Work up  Expected Discharge Plan and Services   Discharge Planning Services: CM Consult   Living arrangements for the past 2 months: Mobile Home                 DME Arranged: N/A         HH Arranged: PT, OT HH Agency: [facility] Date HH Agency Contacted: 11/25/22 Time HH Agency Contacted: 1513 Representative spoke with at [facility] Agency: [place]   Social Determinants of Health (SDOH) Interventions SDOH Screenings   Food Insecurity: No Food Insecurity (11/26/2022)  Housing: Low Risk  (11/26/2022)  Transportation Needs: No Transportation Needs (11/26/2022)  Utilities: Not At Risk (11/26/2022)  Alcohol Screen: Low Risk  (12/16/2021)  Depression (PHQ2-9): Medium Risk (09/24/2022)  Financial Resource Strain: Low Risk  (06/16/2022)  Recent Concern: Financial Resource Strain - Medium Risk (05/23/2022)  Physical Activity: Insufficiently Active (12/16/2021)  Social Connections: Moderately Isolated (12/16/2021)  Stress: No Stress Concern Present (12/16/2021)  Tobacco Use: Medium Risk (11/24/2022)    Readmission Risk Interventions     No data to display

## 2022-11-28 DIAGNOSIS — J181 Lobar pneumonia, unspecified organism: Secondary | ICD-10-CM | POA: Diagnosis not present

## 2022-11-28 DIAGNOSIS — J9601 Acute respiratory failure with hypoxia: Secondary | ICD-10-CM | POA: Diagnosis not present

## 2022-11-28 DIAGNOSIS — J441 Chronic obstructive pulmonary disease with (acute) exacerbation: Secondary | ICD-10-CM | POA: Diagnosis not present

## 2022-11-28 DIAGNOSIS — E876 Hypokalemia: Secondary | ICD-10-CM

## 2022-11-28 DIAGNOSIS — I4891 Unspecified atrial fibrillation: Secondary | ICD-10-CM | POA: Diagnosis not present

## 2022-11-28 LAB — CBC
HCT: 34.2 % — ABNORMAL LOW (ref 36.0–46.0)
Hemoglobin: 10.8 g/dL — ABNORMAL LOW (ref 12.0–15.0)
MCH: 30.3 pg (ref 26.0–34.0)
MCHC: 31.6 g/dL (ref 30.0–36.0)
MCV: 96.1 fL (ref 80.0–100.0)
Platelets: 393 10*3/uL (ref 150–400)
RBC: 3.56 MIL/uL — ABNORMAL LOW (ref 3.87–5.11)
RDW: 13.2 % (ref 11.5–15.5)
WBC: 11.6 10*3/uL — ABNORMAL HIGH (ref 4.0–10.5)
nRBC: 0 % (ref 0.0–0.2)

## 2022-11-28 LAB — BASIC METABOLIC PANEL
Anion gap: 8 (ref 5–15)
BUN: 21 mg/dL (ref 8–23)
CO2: 32 mmol/L (ref 22–32)
Calcium: 8.9 mg/dL (ref 8.9–10.3)
Chloride: 97 mmol/L — ABNORMAL LOW (ref 98–111)
Creatinine, Ser: 0.89 mg/dL (ref 0.44–1.00)
GFR, Estimated: >60 mL/min (ref >60)
Glucose, Bld: 97 mg/dL (ref 70–99)
Potassium: 4 mmol/L (ref 3.5–5.1)
Sodium: 137 mmol/L (ref 135–145)

## 2022-11-28 LAB — GLUCOSE, CAPILLARY
Glucose-Capillary: 59 mg/dL — ABNORMAL LOW (ref 70–99)
Glucose-Capillary: 64 mg/dL — ABNORMAL LOW (ref 70–99)
Glucose-Capillary: 115 mg/dL — ABNORMAL HIGH (ref 70–99)
Glucose-Capillary: 210 mg/dL — ABNORMAL HIGH (ref 70–99)
Glucose-Capillary: 190 mg/dL — ABNORMAL HIGH (ref 70–99)
Glucose-Capillary: 298 mg/dL — ABNORMAL HIGH (ref 70–99)

## 2022-11-28 MED ORDER — INSULIN GLARGINE-YFGN 100 UNIT/ML ~~LOC~~ SOLN
45.0000 [IU] | Freq: Every day | SUBCUTANEOUS | Status: DC
  Administered 2022-11-28 – 2022-11-29 (×2): 45 [IU] via SUBCUTANEOUS
  Filled 2022-11-28 (×3): qty 0.45

## 2022-11-28 MED ORDER — NYSTATIN 100000 UNIT/ML MT SUSP
5.0000 mL | Freq: Four times a day (QID) | OROMUCOSAL | Status: DC
  Administered 2022-11-28 – 2022-12-01 (×13): 500000 [IU] via ORAL
  Filled 2022-11-28 (×15): qty 5

## 2022-11-28 MED ORDER — AZITHROMYCIN 250 MG PO TABS
250.0000 mg | ORAL_TABLET | Freq: Every day | ORAL | Status: DC
  Administered 2022-11-29 – 2022-12-01 (×3): 250 mg via ORAL
  Filled 2022-11-28 (×3): qty 1

## 2022-11-28 MED ORDER — AZITHROMYCIN 500 MG PO TABS
500.0000 mg | ORAL_TABLET | Freq: Every day | ORAL | Status: AC
  Administered 2022-11-28: 500 mg via ORAL
  Filled 2022-11-28: qty 1

## 2022-11-28 NOTE — TOC Progression Note (Signed)
Transition of Care [facility]) - Progression Note    Patient Details  Name: Jordan Chan MRN: [id number] Date of Birth: 1951/03/30  Transition of Care [facility]) CM/SW Contact  Marlowe Sax, RN Phone Number: 11/28/2022, 3:16 PM  Clinical Narrative:    TOC continues to follow, the patient my need Home oxygen for day time, she already has it at night   Expected Discharge Plan: Home/Self Care Barriers to Discharge: Continued Medical Work up  Expected Discharge Plan and Services   Discharge Planning Services: CM Consult   Living arrangements for the past 2 months: Mobile Home                 DME Arranged: N/A         HH Arranged: PT, OT HH Agency: [facility] Date [facility] Agency Contacted: 11/25/22 Time HH Agency Contacted: 1513 Representative spoke with at [facility] Agency: [place]   Social Determinants of Health (SDOH) Interventions SDOH Screenings   Food Insecurity: No Food Insecurity (11/26/2022)  Housing: Low Risk  (11/26/2022)  Transportation Needs: No Transportation Needs (11/26/2022)  Utilities: Not At Risk (11/26/2022)  Alcohol Screen: Low Risk  (12/16/2021)  Depression (PHQ2-9): Medium Risk (09/24/2022)  Financial Resource Strain: Low Risk  (06/16/2022)  Recent Concern: Financial Resource Strain - Medium Risk (05/23/2022)  Physical Activity: Insufficiently Active (12/16/2021)  Social Connections: Moderately Isolated (12/16/2021)  Stress: No Stress Concern Present (12/16/2021)  Tobacco Use: Medium Risk (11/24/2022)    Readmission Risk Interventions     No data to display

## 2022-11-28 NOTE — Inpatient Diabetes Management (Signed)
Inpatient Diabetes Program Recommendations  AACE/ADA: New Consensus Statement on Inpatient Glycemic Control (2015)  Target Ranges:  Prepandial:   less than 140 mg/dL      Peak postprandial:   less than 180 mg/dL (1-2 hours)      Critically ill patients:  140 - 180 mg/dL   Lab Results  Component Value Date   GLUCAP 115 (H) 11/28/2022   HGBA1C 8.5 (H) 09/24/2022    Review of Glycemic Control  Latest Reference Range & Units 11/27/22 07:48 11/27/22 11:51 11/27/22 16:37 11/27/22 21:11 11/28/22 08:06 11/28/22 08:22  Glucose-Capillary 70 - 99 mg/dL 161 (H) 096 (H) 045 (H) 254 (H) 64 (L) 59 (L)  (H): Data is abnormally high (L): Data is abnormally low  Diabetes history: DM2 Outpatient Diabetes medications: Tresiba 55 units QHS, Metformin 1000 mg BID Current orders for Inpatient glycemic control: Semglee 55 units QHS, Novolog 0-15 units TID and 0-5 units QHS, Solumedrol 40 mg QD  Inpatient Diabetes Program Recommendations:    Please consider:  Semglee 45 units QHS Novolog 2 units TID with meals  Will continue to follow while inpatient.  Thank you, Dulce Sellar, MSN, CDCES Diabetes Coordinator Inpatient Diabetes Program [phone] (team pager from 8a-5p)

## 2022-11-28 NOTE — Care Management Important Message (Signed)
Important Message  Patient Details  Name: Jordan Chan MRN: [id number] Date of Birth: 07-31-1951   Medicare Important Message Given:  Yes     Olegario Messier A Ernest Orr 11/28/2022, 1:51 PM

## 2022-11-28 NOTE — Progress Notes (Signed)
Progress Note   Patient: Jordan Chan ZOX:[id number] DOB: February 02, 1951 DOA: 11/24/2022     3 DOS: the patient was seen and examined on 11/28/2022   Brief hospital course: AI SONNENFELD is a 72 y.o. female with medical history significant of COPD on night oxygen, chronic atrial fibrillation, chronic diastolic congestive heart failure, type 2 diabetes, gastroesophageal reflux disease, essential hypertension, dyslipidemia, chronic pain with a pain pump, obstructive sleep apnea, history of stroke, who present to the hospital with worsening shortness of breath, he had significant respiratory distress and hypoxemia, was placed on 5 L oxygen.  Chest x-ray showed left lower lobe pneumonia.  Patient also had a significant vomiting for the last few days, pneumonia probably due to aspiration.  She met sepsis criteria at admission with significant tachycardia, leukocytosis, mild elevation of lactic acid level. Antibiotic changed to Unasyn for aspiration pneumonia after admission.  4/24.  With bronchospasm and wheezing started Solu-Medrol.  Switch her inhalers over to nebulizers.  Able to switch from high flow nasal cannula 4 L over the regular nasal cannula 3 L. 4/25.  Continue Solu-Medrol with bronchospasm. 4/26.  Still with bronchospasm.  Continue Solu-Medrol.  Assessment and Plan: * Acute hypoxemic respiratory failure (HCC) Patient on high flow nasal cannula 4 L on 4/24.  Now on regular nasal cannula 3 L.  Patient only wears oxygen at night.  Patient did have a pulse ox of 87% on room air on 4/22.  Try to taper off oxygen.  Lobar pneumonia (HCC) Left lobar pneumonia.  Patient on Unasyn.  And Zithromax since patient not improving.  Sepsis (HCC) Severe sepsis present on admission, leukocytosis, acute respiratory failure, tachycardia and lactic acidosis.  Continue IV Unasyn and add oral Zithromax.  COPD with acute exacerbation (HCC) With bronchospasm today, continue Solu-Medrol 40 mg IV daily and  nebulizer treatments.  Incentive spirometer.  Hypokalemia Replaced  Atrial fibrillation with RVR (HCC) Patient is already rate controlled.  Continue with apixaban as at home  Uncontrolled type 2 diabetes mellitus with hyperglycemia, without long-term current use of insulin (HCC) Hemoglobin A1c of 8.5.  Patient had a low sugar this morning.  Decrease Semglee insulin to 45 units at night.  Advise she must eat an evening snack.  CKD stage 3a, GFR 45-59 ml/min (HCC) Creatinine down to 0.89 with a GFR of 60  Hyponatremia Resolved  Gastroenteritis Had diarrhea prior to coming into the hospital.  Obesity (BMI 30-39.9) BMI 31.23        Subjective: Patient not feeling well.  Had a low sugar this morning.  Did not sleep very well last night.  Still having shortness of breath and cough.  Admitted with pneumonia and acute respiratory failure.  Physical Exam: Vitals:   11/27/22 2308 11/28/22 0340 11/28/22 0744 11/28/22 0803  BP: (!) 116/51 132/70  133/67  Pulse: 75 72  73  Resp: 18 18  18   Temp: 98.3 F (36.8 C) 98.2 F (36.8 C)  97.6 F (36.4 C)  TempSrc:  Oral    SpO2: 98% 100% 100% 96%  Weight:      Height:       Physical Exam HENT:     Head: Normocephalic.     Mouth/Throat:     Pharynx: No oropharyngeal exudate.  Eyes:     General: Lids are normal.     Conjunctiva/sclera: Conjunctivae normal.  Cardiovascular:     Rate and Rhythm: Normal rate and regular rhythm.     Heart sounds: Normal heart sounds, S1  normal and S2 normal.  Pulmonary:     Breath sounds: Examination of the right-middle field reveals decreased breath sounds and wheezing. Examination of the left-middle field reveals decreased breath sounds and wheezing. Examination of the right-lower field reveals decreased breath sounds and rhonchi. Examination of the left-lower field reveals decreased breath sounds and rhonchi. Decreased breath sounds, wheezing and rhonchi present.  Abdominal:     Palpations: Abdomen  is soft.     Tenderness: There is no abdominal tenderness.  Musculoskeletal:     Right lower leg: No swelling.     Left lower leg: No swelling.  Skin:    General: Skin is warm.     Findings: No rash.  Neurological:     Mental Status: She is alert and oriented to person, place, and time.     Data Reviewed: Creatinine 0.89, white blood cell count 11.6, hemoglobin 10.8  Family Communication: Left message for husband  Disposition: Status is: Inpatient Remains inpatient appropriate because: Still with bronchospasm and unable to get off oxygen as of yet.  Continue IV Solu-Medrol.  Planned Discharge Destination: Home    Time spent: 28 minutes  Author: Alford Highland, MD 11/28/2022 3:02 PM  For on call review [email or web address].

## 2022-11-28 NOTE — Progress Notes (Signed)
Physical Therapy Treatment Patient Details Name: Jordan Chan MRN: [id number] DOB: [birth year] Today's Date: 11/28/2022   History of Present Illness Pt is a 72 y.o. female with medical history significant of COPD on night oxygen, chronic atrial fibrillation, chronic diastolic congestive heart failure, type 2 diabetes, gastroesophageal reflux disease, essential hypertension, dyslipidemia, chronic back pain with a pain pump, obstructive sleep apnea, history of stroke, who present to the hospital with worsening shortness of breath. MD assessment includes: left lower lobe aspiration pneumonia, sepsis secondary to pneumonia, and acute hypoxemic respiratory failure secondary to aspiration pneumonia and COPD exacerbation.    PT Comments    Pt was pleasant and motivated to participate during the session and put forth good effort throughout. Pt mod Ind/Ind with all functional tasks including amb with and without an AD and ascending/descending stairs with one rail to simulate home environment.  Pt's SpO2 remained >/= 97% on 3L during the session with HR WNL.  Pt reported no adverse symptoms during the session and stated no desire for further PT services while in acute care or at discharge. Will complete PT orders at this time but will reassess pt pending a change in status upon receipt of new PT orders.     Recommendations for follow up therapy are one component of a multi-disciplinary discharge planning process, led by the attending physician.  Recommendations may be updated based on patient status, additional functional criteria and insurance authorization.  Follow Up Recommendations       Assistance Recommended at Discharge PRN  Patient can return home with the following Assist for transportation   Equipment Recommendations  None recommended by PT    Recommendations for Other Services       Precautions / Restrictions Precautions Precautions: Fall Restrictions Weight Bearing  Restrictions: No     Mobility  Bed Mobility Overal bed mobility: Independent                  Transfers Overall transfer level: Independent                 General transfer comment: Good eccentric and concentric control and stability with transfers to/from various height surfaces    Ambulation/Gait Ambulation/Gait assistance: Modified independent (Device/Increase time) Gait Distance (Feet): 200 Feet x 1 with RW, 30 Feet x 1 without an AD Assistive device: Rolling walker (2 wheels), None Gait Pattern/deviations: Step-through pattern, Decreased step length - right, Decreased step length - left Gait velocity: WFL     General Gait Details: Steady amb without LOB both with a RW and without an AD   Stairs Stairs: Yes Stairs assistance: Modified independent (Device/Increase time) Stair Management: One rail Right, Alternating pattern, Forwards Number of Stairs: 4 General stair comments: Pt able to ascend and descend 4 steps with one rail with good eccentric and concentric control and stability   Wheelchair Mobility    Modified Rankin (Stroke Patients Only)       Balance Overall balance assessment: No apparent balance deficits (not formally assessed) Sitting-balance support: Feet supported Sitting balance-Leahy Scale: Normal     Standing balance support: No upper extremity supported Standing balance-Leahy Scale: Good                              Cognition Arousal/Alertness: Awake/alert Behavior During Therapy: WFL for tasks assessed/performed Overall Cognitive Status: Within Functional Limits for tasks assessed  Exercises      General Comments        Pertinent Vitals/Pain Pain Assessment Pain Assessment: No/denies pain    Home Living                          Prior Function            PT Goals (current goals can now be found in the care plan section) Progress  towards PT goals: Goals met/education completed, patient discharged from PT    Frequency    Min 2X/week      PT Plan      Co-evaluation              AM-PAC PT "6 Clicks" Mobility   Outcome Measure  Help needed turning from your back to your side while in a flat bed without using bedrails?: None Help needed moving from lying on your back to sitting on the side of a flat bed without using bedrails?: None Help needed moving to and from a bed to a chair (including a wheelchair)?: None Help needed standing up from a chair using your arms (e.g., wheelchair or bedside chair)?: None Help needed to walk in hospital room?: None Help needed climbing 3-5 steps with a railing? : None 6 Click Score: 24    End of Session Equipment Utilized During Treatment: Oxygen;Gait belt Activity Tolerance: Patient tolerated treatment well Patient left: in chair;with family/visitor present;with call bell/phone within reach Nurse Communication: Mobility status PT Visit Diagnosis: Difficulty in walking, not elsewhere classified (R26.2)     Time: 9147-8295 PT Time Calculation (min) (ACUTE ONLY): 17 min  Charges:  $Gait Training: 8-22 mins                     D. Scott [name] PT, DPT 11/28/22, 12:01 PM

## 2022-11-28 NOTE — Plan of Care (Signed)

## 2022-11-28 NOTE — Consult Note (Signed)
   [facility] Inpatient Consult   11/28/2022  Jordan Chan 04/17/1951 [id number]     Location: [facility] RN Hospital Liaison screened remotely [facility]).   [facility] Customer service manager [facility]) Accountable Care Organization [ACO] Patient: Chemical engineer)    Primary Care Provider:  Marjie Skiff, NP [facility]   Patient screened for  readmission hospitalization with noted medium risk score for unplanned readmission risk with 2 IP in 6 months. [facility]/Population Health RN liaison will assess for potential [facility]) Care Management service needs for post hospital transition for care coordination. [facility] liaison spoke with pt and introduced [facility] services and offered a post hospital follow up call for [facility] RN care coordinator (pt very receptive).  Plan. HIPAA verified and [facility]/Population Health RN Liaison will continue to follow ongoing disposition in assessing for post hospital community care coordination/management needs.  Referral request for community care coordination: pending disposition    [facility] Care Management/Population Health does not replace or interfere with any arrangements made by the Inpatient Transition of Care team.   For questions contact:    Elliot Cousin, RN, BSN [facility] Liaison Gate   [facility]  Population Health Office Hours MTWF 8:00 am to 6 pm off on Thursday [phone] mobile [phone] [Office toll free line][facility] Office Hours are M-F 8:30 - 5 pm 24 hour nurse advise line [phone] Conceirge  [email or web address]

## 2022-11-29 DIAGNOSIS — J181 Lobar pneumonia, unspecified organism: Secondary | ICD-10-CM | POA: Diagnosis not present

## 2022-11-29 DIAGNOSIS — J9601 Acute respiratory failure with hypoxia: Secondary | ICD-10-CM | POA: Diagnosis not present

## 2022-11-29 DIAGNOSIS — J441 Chronic obstructive pulmonary disease with (acute) exacerbation: Secondary | ICD-10-CM | POA: Diagnosis not present

## 2022-11-29 DIAGNOSIS — A419 Sepsis, unspecified organism: Secondary | ICD-10-CM | POA: Diagnosis not present

## 2022-11-29 LAB — CULTURE, BLOOD (ROUTINE X 2)
Culture: NO GROWTH
Culture: NO GROWTH

## 2022-11-29 LAB — GLUCOSE, CAPILLARY
Glucose-Capillary: 127 mg/dL — ABNORMAL HIGH (ref 70–99)
Glucose-Capillary: 190 mg/dL — ABNORMAL HIGH (ref 70–99)
Glucose-Capillary: 326 mg/dL — ABNORMAL HIGH (ref 70–99)
Glucose-Capillary: 193 mg/dL — ABNORMAL HIGH (ref 70–99)

## 2022-11-29 MED ORDER — METOPROLOL TARTRATE 25 MG PO TABS
12.5000 mg | ORAL_TABLET | Freq: Two times a day (BID) | ORAL | Status: DC
  Administered 2022-11-29 – 2022-12-01 (×5): 12.5 mg via ORAL
  Filled 2022-11-29 (×4): qty 1

## 2022-11-29 NOTE — Assessment & Plan Note (Signed)
Present on admission with leukocytosis tachycardia and pneumonia, lactic acidosis and acute hypoxic respiratory failure.

## 2022-11-29 NOTE — Plan of Care (Signed)

## 2022-11-29 NOTE — Progress Notes (Signed)
Progress Note   Patient: Jordan Chan [id number]:[id number] DOB: 1950-11-12 DOA: 11/24/2022     4 DOS: the patient was seen and examined on 11/29/2022   Brief hospital course: Jordan Chan is a 72 y.o. female with medical history significant of COPD on night oxygen, chronic atrial fibrillation, chronic diastolic congestive heart failure, type 2 diabetes, gastroesophageal reflux disease, essential hypertension, dyslipidemia, chronic pain with a pain pump, obstructive sleep apnea, history of stroke, who present to the hospital with worsening shortness of breath, he had significant respiratory distress and hypoxemia, was placed on 5 L oxygen.  Chest x-ray showed left lower lobe pneumonia.  Patient also had a significant vomiting for the last few days, pneumonia probably due to aspiration.  She met sepsis criteria at admission with significant tachycardia, leukocytosis, mild elevation of lactic acid level. Antibiotic changed to Unasyn for aspiration pneumonia after admission.  4/24.  With bronchospasm and wheezing started Solu-Medrol.  Switch her inhalers over to nebulizers.  Able to switch from high flow nasal cannula 4 L over the regular nasal cannula 3 L. 4/25.  Continue Solu-Medrol with bronchospasm. 4/26.  Still with bronchospasm.  Continue Solu-Medrol. 4/27.  Patient finally starting to feel better.  Still has wheeze more on the right side.  Desaturated with coming off oxygen with ambulation.  Assessment and Plan: * Acute hypoxemic respiratory failure (HCC) Patient on high flow nasal cannula 4 L on 4/24.  Patient did have a pulse ox of 87% on room air on 4/22.  Today patient desaturated down to 86 with ambulation.  Lobar pneumonia (HCC) Left lobar pneumonia.  Patient on Unasyn.  Continue Zithromax.   Severe sepsis (HCC) Present on admission with leukocytosis tachycardia and pneumonia, lactic acidosis and acute hypoxic respiratory failure.  COPD with acute exacerbation (HCC) With  bronchospasm today, continue Solu-Medrol 40 mg IV daily and nebulizer treatments.  Incentive spirometer.  Hypokalemia Replaced  Atrial fibrillation with RVR (HCC) Patient is already rate controlled on metoprolol.  Continue with apixaban as at home  Uncontrolled type 2 diabetes mellitus with hyperglycemia, without long-term current use of insulin (HCC) Hemoglobin A1c of 8.5.  Patient had a low sugar yesterday morning.  Decreased Semglee insulin to 45 units at night.  Advise she must eat an evening snack.  CKD stage 3a, GFR 45-59 ml/min (HCC) Creatinine down to 0.89 with a GFR of 60  Hyponatremia Resolved  Gastroenteritis Had diarrhea prior to coming into the hospital.  Obesity (BMI 30-39.9) BMI 31.23        Subjective: Patient with ambulating today pulse ox ranging between 86 and 91.  Patient feeling better today than previously.  Still with some cough and wheeze.  Admitted with COPD exacerbation and pneumonia.  Physical Exam: Vitals:   11/28/22 1927 11/29/22 0030 11/29/22 0736 11/29/22 0755  BP:  122/69  (!) 112/50  Pulse:  81  72  Resp:  18  18  Temp:  98.3 F (36.8 C)  97.6 F (36.4 C)  TempSrc:      SpO2: 97% 98% 98% 98%  Weight:      Height:       Physical Exam HENT:     Head: Normocephalic.     Mouth/Throat:     Pharynx: No oropharyngeal exudate.  Eyes:     General: Lids are normal.     Conjunctiva/sclera: Conjunctivae normal.  Cardiovascular:     Rate and Rhythm: Normal rate and regular rhythm.     Heart sounds: Normal heart sounds,  S1 normal and S2 normal.  Pulmonary:     Breath sounds: Examination of the right-middle field reveals wheezing. Examination of the right-lower field reveals decreased breath sounds and wheezing. Examination of the left-lower field reveals decreased breath sounds and wheezing. Decreased breath sounds and wheezing present. No rhonchi.  Abdominal:     Palpations: Abdomen is soft.     Tenderness: There is no abdominal  tenderness.  Musculoskeletal:     Right lower leg: No swelling.     Left lower leg: No swelling.  Skin:    General: Skin is warm.     Findings: No rash.  Neurological:     Mental Status: She is alert and oriented to person, place, and time.     Data Reviewed: Creatinine 0.8, Hemoglobin 10.8, white blood cell count 11.6  Disposition: Status is: Inpatient Remains inpatient appropriate because: Patient does not wear oxygen during the day and hypoxic today with walking around and still has wheeze on the right side  Planned Discharge Destination: Home    Time spent: 28 minutes  Author: Alford Highland, MD 11/29/2022 2:14 PM  For on call review [email or web address].

## 2022-11-30 DIAGNOSIS — J181 Lobar pneumonia, unspecified organism: Secondary | ICD-10-CM | POA: Diagnosis not present

## 2022-11-30 DIAGNOSIS — A419 Sepsis, unspecified organism: Secondary | ICD-10-CM | POA: Diagnosis not present

## 2022-11-30 DIAGNOSIS — J441 Chronic obstructive pulmonary disease with (acute) exacerbation: Secondary | ICD-10-CM | POA: Diagnosis not present

## 2022-11-30 DIAGNOSIS — J9601 Acute respiratory failure with hypoxia: Secondary | ICD-10-CM | POA: Diagnosis not present

## 2022-11-30 LAB — GLUCOSE, CAPILLARY
Glucose-Capillary: 158 mg/dL — ABNORMAL HIGH (ref 70–99)
Glucose-Capillary: 201 mg/dL — ABNORMAL HIGH (ref 70–99)
Glucose-Capillary: 417 mg/dL — ABNORMAL HIGH (ref 70–99)
Glucose-Capillary: 360 mg/dL — ABNORMAL HIGH (ref 70–99)

## 2022-11-30 MED ORDER — HYDROCOD POLI-CHLORPHE POLI ER 10-8 MG/5ML PO SUER: 5.0000 mL | Freq: Two times a day (BID) | ORAL | Status: DC | PRN

## 2022-11-30 MED ORDER — REVEFENACIN 175 MCG/3ML IN SOLN
175.0000 ug | Freq: Every day | RESPIRATORY_TRACT | Status: DC
  Filled 2022-11-30: qty 3

## 2022-11-30 MED ORDER — INSULIN GLARGINE-YFGN 100 UNIT/ML ~~LOC~~ SOLN
52.0000 [IU] | Freq: Every day | SUBCUTANEOUS | Status: DC
  Administered 2022-11-30: 52 [IU] via SUBCUTANEOUS
  Filled 2022-11-30 (×2): qty 0.52

## 2022-11-30 MED ORDER — HYDROCOD POLI-CHLORPHE POLI ER 10-8 MG/5ML PO SUER
5.0000 mL | Freq: Once | ORAL | Status: AC
  Administered 2022-11-30: 5 mL via ORAL
  Filled 2022-11-30: qty 5

## 2022-11-30 MED ORDER — INSULIN ASPART 100 UNIT/ML IJ SOLN
5.0000 [IU] | Freq: Once | INTRAMUSCULAR | Status: AC
  Administered 2022-11-30: 5 [IU] via SUBCUTANEOUS

## 2022-11-30 NOTE — Progress Notes (Signed)
Progress Note   Patient: Jordan Chan [id number] DOB: 1951/07/11 DOA: 11/24/2022     5 DOS: the patient was seen and examined on 11/30/2022   Brief hospital course: Jordan Chan is a 72 y.o. female with medical history significant of COPD on night oxygen, chronic atrial fibrillation, chronic diastolic congestive heart failure, type 2 diabetes, gastroesophageal reflux disease, essential hypertension, dyslipidemia, chronic pain with a pain pump, obstructive sleep apnea, history of stroke, who present to the hospital with worsening shortness of breath, he had significant respiratory distress and hypoxemia, was placed on 5 L oxygen.  Chest x-ray showed left lower lobe pneumonia.  Patient also had a significant vomiting for the last few days, pneumonia probably due to aspiration.  She met sepsis criteria at admission with significant tachycardia, leukocytosis, mild elevation of lactic acid level. Antibiotic changed to Unasyn for aspiration pneumonia after admission.  4/24.  With bronchospasm and wheezing started Solu-Medrol.  Switch her inhalers over to nebulizers.  Able to switch from high flow nasal cannula 4 L over the regular nasal cannula 3 L. 4/25.  Continue Solu-Medrol with bronchospasm. 4/26.  Still with bronchospasm.  Continue Solu-Medrol. 4/27.  Patient finally starting to feel better.  Still has wheeze more on the right side.  Desaturated with coming off oxygen with ambulation. 4/28.  Patient not feeling as well today.  Dropped oxygen saturations with ambulation down to 83% at its lowest.  Assessment and Plan: * Acute hypoxemic respiratory failure (HCC) Patient on high flow nasal cannula 4 L on 4/24.  Patient did have a pulse ox of 87% on room air on 4/22.  Today patient desaturated down to 83 with ambulation.  May end up needing 24/7 oxygen.  Lobar pneumonia (HCC) Left lobar pneumonia.  Completed Unasyn.  Continue Zithromax.   Severe sepsis (HCC) Present on admission with  leukocytosis tachycardia and pneumonia, lactic acidosis and acute hypoxic respiratory failure.  COPD with acute exacerbation (HCC) Continue Solu-Medrol 40 mg IV daily and nebulizer treatments.  Incentive spirometer.  Hypokalemia Replaced  Atrial fibrillation with RVR (HCC) Patient is already rate controlled on metoprolol.  Continue with apixaban as at home  Uncontrolled type 2 diabetes mellitus with hyperglycemia, without long-term current use of insulin (HCC) Hemoglobin A1c of 8.5.  Patient had a low sugar the other day.  Decreased Semglee insulin to 45 units at night.  Advise she must eat an evening snack.  CKD stage 3a, GFR 45-59 ml/min (HCC) Creatinine down to 0.89 with a GFR of 60  Hyponatremia Resolved  Gastroenteritis Had diarrhea prior to coming into the hospital.  Obesity (BMI 30-39.9) BMI 31.23        Subjective: Patient not feeling as good today.  Coughing a lot.  Did not sleep very well last night.  Admitted with COPD exacerbation pneumonia  Physical Exam: Vitals:   11/29/22 2020 11/29/22 2321 11/30/22 0803 11/30/22 1012  BP:  (!) 112/59  (!) 111/51  Pulse:  85  91  Resp:  18  16  Temp:  98.2 F (36.8 C)  97.6 F (36.4 C)  TempSrc:      SpO2: 95% 97% 95% 99%  Weight:      Height:       Physical Exam HENT:     Head: Normocephalic.     Mouth/Throat:     Pharynx: No oropharyngeal exudate.  Eyes:     General: Lids are normal.     Conjunctiva/sclera: Conjunctivae normal.  Cardiovascular:  Rate and Rhythm: Normal rate and regular rhythm.     Heart sounds: Normal heart sounds, S1 normal and S2 normal.  Pulmonary:     Breath sounds: Examination of the right-lower field reveals decreased breath sounds and wheezing. Examination of the left-lower field reveals decreased breath sounds. Decreased breath sounds and wheezing present. No rhonchi.  Abdominal:     Palpations: Abdomen is soft.     Tenderness: There is no abdominal tenderness.   Musculoskeletal:     Right lower leg: No swelling.     Left lower leg: No swelling.  Skin:    General: Skin is warm.     Findings: No rash.  Neurological:     Mental Status: She is alert and oriented to person, place, and time.     Data Reviewed: Last 4 sugars 326, 193, 158 and 201  Family Communication: Spoke with daughter at the bedside  Disposition: Status is: Inpatient Remains inpatient appropriate because: Dropped oxygen saturations with walking on room air again today  Planned Discharge Destination: Home    Time spent: 28 minutes  Author: Alford Highland, MD 11/30/2022 2:50 PM  For on call review [email or web address].

## 2022-11-30 NOTE — Plan of Care (Signed)

## 2022-12-01 ENCOUNTER — Other Ambulatory Visit: Payer: Self-pay | Admitting: *Deleted

## 2022-12-01 DIAGNOSIS — J9601 Acute respiratory failure with hypoxia: Secondary | ICD-10-CM | POA: Diagnosis not present

## 2022-12-01 DIAGNOSIS — J441 Chronic obstructive pulmonary disease with (acute) exacerbation: Secondary | ICD-10-CM | POA: Diagnosis not present

## 2022-12-01 DIAGNOSIS — A419 Sepsis, unspecified organism: Secondary | ICD-10-CM | POA: Diagnosis not present

## 2022-12-01 DIAGNOSIS — J189 Pneumonia, unspecified organism: Secondary | ICD-10-CM

## 2022-12-01 DIAGNOSIS — J181 Lobar pneumonia, unspecified organism: Secondary | ICD-10-CM | POA: Diagnosis not present

## 2022-12-01 LAB — CBC
HCT: 37.5 % (ref 36.0–46.0)
Hemoglobin: 11.8 g/dL — ABNORMAL LOW (ref 12.0–15.0)
MCH: 29.9 pg (ref 26.0–34.0)
MCHC: 31.5 g/dL (ref 30.0–36.0)
MCV: 94.9 fL (ref 80.0–100.0)
Platelets: 533 10*3/uL — ABNORMAL HIGH (ref 150–400)
RBC: 3.95 MIL/uL (ref 3.87–5.11)
RDW: 13.2 % (ref 11.5–15.5)
WBC: 15.2 10*3/uL — ABNORMAL HIGH (ref 4.0–10.5)
nRBC: 0 % (ref 0.0–0.2)

## 2022-12-01 LAB — BASIC METABOLIC PANEL
Anion gap: 8 (ref 5–15)
BUN: 21 mg/dL (ref 8–23)
CO2: 35 mmol/L — ABNORMAL HIGH (ref 22–32)
Calcium: 9.4 mg/dL (ref 8.9–10.3)
Chloride: 96 mmol/L — ABNORMAL LOW (ref 98–111)
Creatinine, Ser: 1.05 mg/dL — ABNORMAL HIGH (ref 0.44–1.00)
GFR, Estimated: 56 mL/min — ABNORMAL LOW (ref >60)
Glucose, Bld: 166 mg/dL — ABNORMAL HIGH (ref 70–99)
Potassium: 4 mmol/L (ref 3.5–5.1)
Sodium: 139 mmol/L (ref 135–145)

## 2022-12-01 LAB — GLUCOSE, CAPILLARY
Glucose-Capillary: 323 mg/dL — ABNORMAL HIGH (ref 70–99)
Glucose-Capillary: 350 mg/dL — ABNORMAL HIGH (ref 70–99)

## 2022-12-01 MED ORDER — PREDNISONE 10 MG PO TABS: ORAL_TABLET | ORAL | 0 refills | Status: DC

## 2022-12-01 MED ORDER — AZITHROMYCIN 250 MG PO TABS: ORAL_TABLET | ORAL | 0 refills | Status: DC

## 2022-12-01 MED ORDER — HYDROCOD POLI-CHLORPHE POLI ER 10-8 MG/5ML PO SUER: 5.0000 mL | Freq: Two times a day (BID) | ORAL | 0 refills | Status: DC | PRN

## 2022-12-01 NOTE — Care Management Important Message (Signed)
Important Message  Patient Details  Name: Jordan Chan MRN: [id number] Date of Birth: Apr 06, 1951   Medicare Important Message Given:  Yes     Johnell Comings 12/01/2022, 10:56 AM

## 2022-12-01 NOTE — Inpatient Diabetes Management (Signed)
Inpatient Diabetes Program Recommendations  AACE/ADA: New Consensus Statement on Inpatient Glycemic Control (2015)  Target Ranges:  Prepandial:   less than 140 mg/dL      Peak postprandial:   less than 180 mg/dL (1-2 hours)      Critically ill patients:  140 - 180 mg/dL   Lab Results  Component Value Date   GLUCAP 323 (H) 12/01/2022   HGBA1C 8.5 (H) 09/24/2022    Latest Reference Range & Units 11/30/22 08:01 11/30/22 11:44 11/30/22 17:20 11/30/22 21:51 12/01/22 09:49  Glucose-Capillary 70 - 99 mg/dL 914 (H) Novolog 3 units 201 (H) Novolog 5 units 417 (H) Novolog 20 units 360 (H) Novolog 5 units 323 (H) Novolog 11 units  (H): Data is abnormally high  Diabetes history: DM2 Outpatient Diabetes medications: Tresiba 55 units QHS, Metformin 1000 mg BID Current orders for Inpatient glycemic control: Semglee 52 units QHS, Novolog 0-15 units TID and 0-5 units QHS, Solumedrol 40 mg QD  Inpatient Diabetes Program Recommendations:   Patient has received Novolog 33 units correction over the past 24 hrs. Please consider: -Add carb mod to diet order if appropriate -Add Novolog 4 units tid meal coverage if eating 50% or > meals  Thank you, Darel Hong E. Fariha Goto, RN, MSN, CDE  Diabetes Coordinator Inpatient Glycemic Control Team Team Pager [phone] (8am-5pm) 12/01/2022 10:32 AM

## 2022-12-01 NOTE — Plan of Care (Signed)

## 2022-12-01 NOTE — Progress Notes (Signed)
Oxygen walk test was completed at 11am. Patient sitting up in bed 02 on room air 96 Patient O2 on ambulation was 96 -100. Patient walked one complete lap around nursing and tolerated the walk well.

## 2022-12-01 NOTE — Discharge Summary (Signed)
Physician Discharge Summary   Patient: Jordan Chan MRN: [id number] DOB: 03/20/1951  Admit date:     11/24/2022  Discharge date: 12/01/22  Discharge Physician: Alford Highland   PCP: Marjie Skiff, NP   Recommendations at discharge:   Follow-up PCP 5 days  Discharge Diagnoses: Principal Problem:   Acute hypoxemic respiratory failure (HCC) Active Problems:   Lobar pneumonia (HCC)   Severe sepsis (HCC)   COPD with acute exacerbation (HCC)   Atrial fibrillation with RVR (HCC)   Hypokalemia   Uncontrolled type 2 diabetes mellitus with hyperglycemia, without long-term current use of insulin (HCC)   CKD stage 3a, GFR 45-59 ml/min (HCC)   Hyponatremia   Obesity (BMI 30-39.9)   Gastroenteritis   Community acquired pneumonia of left lower lobe of lung   Lactic acid acidosis   CAP (community acquired pneumonia)   Chronic atrial fibrillation with RVR [facility])    Hospital Course: Jordan Chan is a 72 y.o. female with medical history significant of COPD on night oxygen, chronic atrial fibrillation, chronic diastolic congestive heart failure, type 2 diabetes, gastroesophageal reflux disease, essential hypertension, dyslipidemia, chronic pain with a pain pump, obstructive sleep apnea, history of stroke, who present to the hospital with worsening shortness of breath, he had significant respiratory distress and hypoxemia, was placed on 5 L oxygen.  Chest x-ray showed left lower lobe pneumonia.  Patient also had a significant vomiting for the last few days, pneumonia probably due to aspiration.  She met sepsis criteria at admission with significant tachycardia, leukocytosis, mild elevation of lactic acid level. Antibiotic changed to Unasyn for aspiration pneumonia after admission.  4/24.  With bronchospasm and wheezing started Solu-Medrol.  Switch her inhalers over to nebulizers.  Able to switch from high flow nasal cannula 4 L over the regular nasal cannula 3 L. 4/25.  Continue  Solu-Medrol with bronchospasm. 4/26.  Still with bronchospasm.  Continue Solu-Medrol. 4/27.  Patient finally starting to feel better.  Still has wheeze more on the right side.  Desaturated with coming off oxygen with ambulation. 4/28.  Patient not feeling as well today.  Dropped oxygen saturations with ambulation down to 83% at its lowest. 4/28.  Patient able to hold her saturations in the 90s with ambulation.  Patient feeling well enough to go home.  Assessment and Plan: * Acute hypoxemic respiratory failure (HCC) Patient on high flow nasal cannula 4 L on 4/24.  Patient did have a pulse ox of 87% on room air on 4/22.  Patient desaturated down to 83% on 4/28.  Today patient able to hold her saturations with ambulating.  Patient feeling well enough to go home.  Patient does wear chronic oxygen at night only.  Able to come off oxygen during the day.  Lobar pneumonia (HCC) Left lobar pneumonia.  Completed Unasyn.  Continue Zithromax for 2 more doses upon going home.  Severe sepsis (HCC) Present on admission with leukocytosis tachycardia and pneumonia, lactic acidosis and acute hypoxic respiratory failure.  COPD with acute exacerbation (HCC) Continue Solu-Medrol 40 mg IV daily and nebulizer treatments.  Incentive spirometer.  Switch over to a prednisone taper upon going home since she was slow to improve during the hospital course.  Hypokalemia Replaced  Atrial fibrillation with RVR (HCC) Paroxysmal in nature.  Patient is already rate controlled on metoprolol.  Continue with apixaban as at home  Uncontrolled type 2 diabetes mellitus with hyperglycemia, without long-term current use of insulin (HCC) Hemoglobin A1c of 8.5.  Patient had a  low sugar the other day and her insulin was decreased at night.  Advise she must eat an evening snack.  Can go back on her usual dose of Trulicity at home.  Advise she must eat an evening snack.  CKD stage 3a, GFR 45-59 ml/min (HCC) Creatinine 1.05 with GFR  56  Hyponatremia Resolved  Gastroenteritis Had diarrhea prior to coming into the hospital.  Obesity (BMI 30-39.9) BMI 31.23         Consultants: None Procedures performed: None Disposition: Home.  Patient declined home health Diet recommendation:  Cardiac and Carb modified diet DISCHARGE MEDICATION: Allergies as of 12/01/2022       Reactions   Other Palpitations   IV steroids   Pain Patch [menthol] Anaphylaxis   Avelox [moxifloxacin Hcl In Nacl] Other (See Comments)   Upset stomach   Doxycycline Diarrhea   Erythromycin Nausea Only, Other (See Comments)   Can take a Z-Pak just fine Other reaction(s): Other (See Comments) Can take Z-Pak Can take a Z-Pak just fine   Fentanyl Nausea Only, Rash   Moxifloxacin Hcl Other (See Comments)   Upset stomach Upset stomach   Oxycontin [oxycodone] Hives   Ozempic [semaglutide] Nausea Only        Medication List     STOP taking these medications    diclofenac 50 MG EC tablet Commonly known as: VOLTAREN   doxycycline 100 MG capsule Commonly known as: VIBRAMYCIN       TAKE these medications    Accu-Chek Aviva Plus test strip Generic drug: glucose blood TEST THREE TIMES DAILY   Accu-Chek Softclix Lancets lancets TEST BLOOD SUGAR THREE TIMES DAILY   albuterol 108 (90 Base) MCG/ACT inhaler Commonly known as: VENTOLIN HFA Inhale 2 puffs into the lungs every 6 (six) hours as needed for wheezing or shortness of breath.   albuterol (2.5 MG/3ML) 0.083% nebulizer solution Commonly known as: PROVENTIL Take 3 mLs (2.5 mg total) by nebulization every 4 (four) hours as needed for wheezing or shortness of breath.   apixaban 5 MG Tabs tablet Commonly known as: ELIQUIS Take 1 tablet (5 mg total) by mouth 2 (two) times daily.   atorvastatin 40 MG tablet Commonly known as: LIPITOR Take 1 tablet (40 mg total) by mouth daily.   azithromycin 250 MG tablet Commonly known as: ZITHROMAX One tab po daily for two  doses Start taking on: December 02, 2022   busPIRone 5 MG tablet Commonly known as: BUSPAR Take 1 tablet (5 mg total) by mouth 2 (two) times daily.   chlorpheniramine-HYDROcodone 10-8 MG/5ML Commonly known as: TUSSIONEX Take 5 mLs by mouth every 12 (twelve) hours as needed for cough.   cholecalciferol 25 MCG (1000 UNIT) tablet Commonly known as: VITAMIN D3 Take 1,000 Units by mouth daily.   Cyanocobalamin 1000 MCG/ML Kit Inject 1,000 mcg as directed every 30 (thirty) days.   cyclobenzaprine 5 MG tablet Commonly known as: FLEXERIL Take 5 mg by mouth 2 (two) times daily as needed for muscle spasms. Takes very rarely   DropSafe Alcohol Prep 70 % Pads USE TWICE DAILY  WITH  SUGAR  Engineer, production Use to inhaler nebulizer treatments at needed per instructions on nebulizer prescription   folic acid 1 MG tablet Commonly known as: FOLVITE TAKE 1 TABLET EVERY DAY   furosemide 40 MG tablet Commonly known as: LASIX Take 0.5 tablets (20 mg total) by mouth daily.   gabapentin 800 MG tablet Commonly known as: NEURONTIN Take 1 tablet (  800 mg total) by mouth 3 (three) times daily.   HYDROcodone-acetaminophen 5-325 MG tablet Commonly known as: NORCO/VICODIN Take 1 tablet by mouth every 8 (eight) hours as needed.   losartan 25 MG tablet Commonly known as: COZAAR Take 0.5 tablets (12.5 mg total) by mouth daily.   metFORMIN 1000 MG tablet Commonly known as: GLUCOPHAGE TAKE 1 TABLET TWICE DAILY WITH MEALS   metoprolol succinate 25 MG 24 hr tablet Commonly known as: TOPROL-XL Take 0.5 tablets (12.5 mg total) by mouth daily.   montelukast 10 MG tablet Commonly known as: SINGULAIR Take 1 tablet (10 mg total) by mouth at bedtime.   ondansetron 4 MG tablet Commonly known as: ZOFRAN Take 1 tablet (4 mg total) by mouth every 8 (eight) hours as needed for nausea or vomiting.   PAIN MANAGEMENT INTRATHECAL (IT) PUMP 1 each by Intrathecal route. Intrathecal  (IT) medication:  Morphine Patient does not remember current. Adjusted every 2.5 months at [facility] in [place].   pantoprazole 40 MG tablet Commonly known as: PROTONIX Take 1 tablet (40 mg total) by mouth daily.   predniSONE 10 MG tablet Commonly known as: DELTASONE 4 tabs po day1; 3 tabs po day2,3; 2 tabs po day4,5; 1 tab po day6,7; 1/2 tab po day8,9   Trelegy Ellipta 100-62.5-25 MCG/ACT Aepb Generic drug: Fluticasone-Umeclidin-Vilant Inhale 1 puff into the lungs daily.   Evaristo Bury FlexTouch 100 UNIT/ML FlexTouch Pen Generic drug: insulin degludec Inject 55 Units into the skin at bedtime.   True Metrix Meter w/Device Kit Use to check blood sugar 4 times a day   Trulicity 4.5 MG/0.5ML Sopn Generic drug: Dulaglutide Inject 4.5 mg as directed once a week.   venlafaxine XR 150 MG 24 hr capsule Commonly known as: EFFEXOR-XR Take 1 capsule (150 mg total) by mouth daily.               Durable Medical Equipment  (From admission, onward)           Start     Ordered   11/27/22 1545  For home use only DME Bedside commode  Once       Question:  Patient needs a bedside commode to treat with the following condition  Answer:  Impaired mobility   11/27/22 1545            Follow-up Information     [name], Jolene T, NP Follow up in 5 day(s).   Specialty: Nurse Practitioner Contact information: [address] [place] [address] [phone]                Discharge Exam: Ceasar Mons Weights   11/24/22 2119  Weight: 68.9 kg   Physical Exam HENT:     Head: Normocephalic.     Mouth/Throat:     Pharynx: No oropharyngeal exudate.  Eyes:     General: Lids are normal.     Conjunctiva/sclera: Conjunctivae normal.  Cardiovascular:     Rate and Rhythm: Normal rate and regular rhythm.     Heart sounds: Normal heart sounds, S1 normal and S2 normal.  Pulmonary:     Breath sounds: Examination of the right-lower field reveals decreased breath  sounds and wheezing. Examination of the left-lower field reveals decreased breath sounds. Decreased breath sounds and wheezing present. No rhonchi.  Abdominal:     Palpations: Abdomen is soft.     Tenderness: There is no abdominal tenderness.  Musculoskeletal:     Right lower leg: No swelling.     Left  lower leg: No swelling.  Skin:    General: Skin is warm.     Findings: No rash.  Neurological:     Mental Status: She is alert and oriented to person, place, and time.      Condition at discharge: stable  The results of significant diagnostics from this hospitalization (including imaging, microbiology, ancillary and laboratory) are listed below for reference.   Imaging Studies: DG Chest 2 View  Result Date: 11/24/2022 CLINICAL DATA:  Shortness of breath EXAM: CHEST - 2 VIEW COMPARISON:  07/15/2022 FINDINGS: Heart and mediastinal contours are within normal limits. Aortic atherosclerosis. Airspace disease in the left lower lobe compatible with pneumonia. Right lung clear. No effusions. No acute bony abnormality. IMPRESSION: Left lower lobe airspace opacity concerning for pneumonia. Electronically Signed   By: Charlett Nose M.D.   On: 11/24/2022 22:06    Microbiology: Results for orders placed or performed during the hospital encounter of 11/24/22  SARS Coronavirus 2 by RT PCR (hospital order, performed in [facility] hospital lab) *cepheid single result test* Anterior Nasal Swab     Status: None   Collection Time: 11/24/22  9:35 PM   Specimen: Anterior Nasal Swab  Result Value Ref Range Status   SARS Coronavirus 2 by RT PCR NEGATIVE NEGATIVE Final    Comment: (NOTE) SARS-CoV-2 target nucleic acids are NOT DETECTED.  The SARS-CoV-2 RNA is generally detectable in upper and lower respiratory specimens during the acute phase of infection. The lowest concentration of SARS-CoV-2 viral copies this assay can detect is 250 copies / mL. A negative result does not preclude SARS-CoV-2  infection and should not be used as the sole basis for treatment or other patient management decisions.  A negative result may occur with improper specimen collection / handling, submission of specimen other than nasopharyngeal swab, presence of viral mutation(s) within the areas targeted by this assay, and inadequate number of viral copies (<250 copies / mL). A negative result must be combined with clinical observations, patient history, and epidemiological information.  Fact Sheet for Patients:   RoadLapTop.co.za  Fact Sheet for Healthcare Providers: http://kim-miller.com/  This test is not yet approved or  cleared by the [place] FDA and has been authorized for detection and/or diagnosis of SARS-CoV-2 by FDA under an Emergency Use Authorization (EUA).  This EUA will remain in effect (meaning this test can be used) for the duration of the COVID-19 declaration under Section 564(b)(1) of the Act, 21 U.S.C. section 360bbb-3(b)(1), unless the authorization is terminated or revoked sooner.  Performed at [facility], [address] [place], [place] [address]   Resp panel by RT-PCR (RSV, Flu A&B, Covid) Anterior Nasal Swab     Status: None   Collection Time: 11/24/22  9:35 PM   Specimen: Anterior Nasal Swab  Result Value Ref Range Status   SARS Coronavirus 2 by RT PCR NEGATIVE NEGATIVE Final    Comment: (NOTE) SARS-CoV-2 target nucleic acids are NOT DETECTED.  The SARS-CoV-2 RNA is generally detectable in upper respiratory specimens during the acute phase of infection. The lowest concentration of SARS-CoV-2 viral copies this assay can detect is 138 copies/mL. A negative result does not preclude SARS-Cov-2 infection and should not be used as the sole basis for treatment or other patient management decisions. A negative result may occur with  improper specimen collection/handling, submission of specimen other than  nasopharyngeal swab, presence of viral mutation(s) within the areas targeted by this assay, and inadequate number of viral copies(<138 copies/mL). A  negative result must be combined with clinical observations, patient history, and epidemiological information. The expected result is Negative.  Fact Sheet for Patients:  BloggerCourse.com  Fact Sheet for Healthcare Providers:  SeriousBroker.it  This test is no t yet approved or cleared by the [place] FDA and  has been authorized for detection and/or diagnosis of SARS-CoV-2 by FDA under an Emergency Use Authorization (EUA). This EUA will remain  in effect (meaning this test can be used) for the duration of the COVID-19 declaration under Section 564(b)(1) of the Act, 21 U.S.C.section 360bbb-3(b)(1), unless the authorization is terminated  or revoked sooner.       Influenza A by PCR NEGATIVE NEGATIVE Final   Influenza B by PCR NEGATIVE NEGATIVE Final    Comment: (NOTE) The Xpert Xpress SARS-CoV-2/FLU/RSV plus assay is intended as an aid in the diagnosis of influenza from Nasopharyngeal swab specimens and should not be used as a sole basis for treatment. Nasal washings and aspirates are unacceptable for Xpert Xpress SARS-CoV-2/FLU/RSV testing.  Fact Sheet for Patients: BloggerCourse.com  Fact Sheet for Healthcare Providers: SeriousBroker.it  This test is not yet approved or cleared by the [place] FDA and has been authorized for detection and/or diagnosis of SARS-CoV-2 by FDA under an Emergency Use Authorization (EUA). This EUA will remain in effect (meaning this test can be used) for the duration of the COVID-19 declaration under Section 564(b)(1) of the Act, 21 U.S.C. section 360bbb-3(b)(1), unless the authorization is terminated or revoked.     Resp Syncytial Virus by PCR NEGATIVE NEGATIVE Final    Comment:  (NOTE) Fact Sheet for Patients: BloggerCourse.com  Fact Sheet for Healthcare Providers: SeriousBroker.it  This test is not yet approved or cleared by the [place] FDA and has been authorized for detection and/or diagnosis of SARS-CoV-2 by FDA under an Emergency Use Authorization (EUA). This EUA will remain in effect (meaning this test can be used) for the duration of the COVID-19 declaration under Section 564(b)(1) of the Act, 21 U.S.C. section 360bbb-3(b)(1), unless the authorization is terminated or revoked.  Performed at [facility], [address] [place], [place] [address]   Blood Culture (routine x 2)     Status: None   Collection Time: 11/24/22 11:01 PM   Specimen: BLOOD  Result Value Ref Range Status   Specimen Description BLOOD  RIGHT [facility]  Final   Special Requests   Final    BOTTLES DRAWN AEROBIC AND ANAEROBIC Blood Culture results may not be optimal due to an inadequate volume of blood received in culture bottles   Culture   Final    NO GROWTH 5 DAYS Performed at [facility], [address] [place], [place] [address]    Report Status 11/29/2022 FINAL  Final  Blood Culture (routine x 2)     Status: None   Collection Time: 11/24/22 11:01 PM   Specimen: BLOOD  Result Value Ref Range Status   Specimen Description BLOOD  RIGHT FOREARM  Final   Special Requests   Final    BOTTLES DRAWN AEROBIC AND ANAEROBIC Blood Culture results may not be optimal due to an inadequate volume of blood received in culture bottles   Culture   Final    NO GROWTH 5 DAYS Performed at [facility], [address], [place], [place] [address]    Report Status 11/29/2022 FINAL  Final    Labs: CBC: Recent Labs  Lab 11/24/22 2133 11/26/22 0450 11/28/22 0529 12/01/22 0626  WBC 19.4* 12.2*  11.6* 15.2*  HGB 12.2 10.8* 10.8* 11.8*  HCT 37.9 34.2* 34.2* 37.5  MCV 93.3 96.6 96.1 94.9  PLT 341 287 393 533*    Basic Metabolic Panel: Recent Labs  Lab 11/24/22 2301 11/26/22 0450 11/28/22 0529 12/01/22 0626  NA 133* 135 137 139  K 4.3 3.4* 4.0 4.0  CL 96* 100 97* 96*  CO2 25 28 32 35*  GLUCOSE 213* 99 97 166*  BUN 21 18 21 21   CREATININE 1.19* 1.05* 0.89 1.05*  CALCIUM 9.0 8.5* 8.9 9.4  MG  --  1.7  --   --    Liver Function Tests: No results for input(s): "AST", "ALT", "ALKPHOS", "BILITOT", "PROT", "ALBUMIN" in the last 168 hours. CBG: Recent Labs  Lab 11/30/22 1144 11/30/22 1720 11/30/22 2151 12/01/22 0949 12/01/22 1236  GLUCAP 201* 417* 360* 323* 350*    Discharge time spent: greater than 30 minutes.  Signed: Alford Highland, MD [facility] 12/01/2022

## 2022-12-01 NOTE — Plan of Care (Signed)
Problem: Education: Goal: Ability to describe self-care measures that may prevent or decrease complications (Diabetes Survival Skills Education) will improve 12/01/2022 1419 by Emilio Aspen, RN Outcome: Adequate for Discharge 12/01/2022 0848 by Emilio Aspen, RN Outcome: Progressing Goal: Individualized Educational Video(s) 12/01/2022 1419 by Emilio Aspen, RN Outcome: Adequate for Discharge 12/01/2022 0848 by Emilio Aspen, RN Outcome: Progressing   Problem: Coping: Goal: Ability to adjust to condition or change in health will improve 12/01/2022 1419 by Emilio Aspen, RN Outcome: Adequate for Discharge 12/01/2022 0848 by Emilio Aspen, RN Outcome: Progressing   Problem: Fluid Volume: Goal: Ability to maintain a balanced intake and output will improve 12/01/2022 1419 by Emilio Aspen, RN Outcome: Adequate for Discharge 12/01/2022 0848 by Emilio Aspen, RN Outcome: Progressing   Problem: Health Behavior/Discharge Planning: Goal: Ability to identify and utilize available resources and services will improve 12/01/2022 1419 by Emilio Aspen, RN Outcome: Adequate for Discharge 12/01/2022 0848 by Emilio Aspen, RN Outcome: Progressing Goal: Ability to manage health-related needs will improve 12/01/2022 1419 by Emilio Aspen, RN Outcome: Adequate for Discharge 12/01/2022 0848 by Emilio Aspen, RN Outcome: Progressing   Problem: Metabolic: Goal: Ability to maintain appropriate glucose levels will improve 12/01/2022 1419 by Emilio Aspen, RN Outcome: Adequate for Discharge 12/01/2022 0848 by Emilio Aspen, RN Outcome: Progressing   Problem: Nutritional: Goal: Maintenance of adequate nutrition will improve 12/01/2022 1419 by Emilio Aspen, RN Outcome: Adequate for Discharge 12/01/2022 0848 by Emilio Aspen, RN Outcome: Progressing Goal: Progress toward achieving an optimal weight will improve 12/01/2022 1419 by Emilio Aspen, RN Outcome:  Adequate for Discharge 12/01/2022 0848 by Emilio Aspen, RN Outcome: Progressing   Problem: Skin Integrity: Goal: Risk for impaired skin integrity will decrease 12/01/2022 1419 by Emilio Aspen, RN Outcome: Adequate for Discharge 12/01/2022 0848 by Emilio Aspen, RN Outcome: Progressing   Problem: Tissue Perfusion: Goal: Adequacy of tissue perfusion will improve 12/01/2022 1419 by Emilio Aspen, RN Outcome: Adequate for Discharge 12/01/2022 0848 by Emilio Aspen, RN Outcome: Progressing   Problem: Education: Goal: Knowledge of General Education information will improve Description: Including pain rating scale, medication(s)/side effects and non-pharmacologic comfort measures 12/01/2022 1419 by Emilio Aspen, RN Outcome: Adequate for Discharge 12/01/2022 0848 by Emilio Aspen, RN Outcome: Progressing   Problem: Health Behavior/Discharge Planning: Goal: Ability to manage health-related needs will improve 12/01/2022 1419 by Emilio Aspen, RN Outcome: Adequate for Discharge 12/01/2022 0848 by Emilio Aspen, RN Outcome: Progressing   Problem: Clinical Measurements: Goal: Ability to maintain clinical measurements within normal limits will improve 12/01/2022 1419 by Emilio Aspen, RN Outcome: Adequate for Discharge 12/01/2022 0848 by Emilio Aspen, RN Outcome: Progressing Goal: Will remain free from infection 12/01/2022 1419 by Emilio Aspen, RN Outcome: Adequate for Discharge 12/01/2022 0848 by Emilio Aspen, RN Outcome: Progressing Goal: Diagnostic test results will improve 12/01/2022 1419 by Emilio Aspen, RN Outcome: Adequate for Discharge 12/01/2022 0848 by Emilio Aspen, RN Outcome: Progressing Goal: Respiratory complications will improve 12/01/2022 1419 by Emilio Aspen, RN Outcome: Adequate for Discharge 12/01/2022 0848 by Emilio Aspen, RN Outcome: Progressing Goal: Cardiovascular complication will be avoided 12/01/2022 1419 by  Emilio Aspen, RN Outcome: Adequate for Discharge 12/01/2022 0848 by Emilio Aspen, RN Outcome: Progressing   Problem: Activity: Goal: Risk for activity intolerance will decrease 12/01/2022 1419 by Emilio Aspen, RN Outcome: Adequate for Discharge  12/01/2022 0848 by Emilio Aspen, RN Outcome: Progressing   Problem: Nutrition: Goal: Adequate nutrition will be maintained 12/01/2022 1419 by Emilio Aspen, RN Outcome: Adequate for Discharge 12/01/2022 0848 by Emilio Aspen, RN Outcome: Progressing   Problem: Coping: Goal: Level of anxiety will decrease 12/01/2022 1419 by Emilio Aspen, RN Outcome: Adequate for Discharge 12/01/2022 0848 by Emilio Aspen, RN Outcome: Progressing   Problem: Elimination: Goal: Will not experience complications related to bowel motility 12/01/2022 1419 by Emilio Aspen, RN Outcome: Adequate for Discharge 12/01/2022 0848 by Emilio Aspen, RN Outcome: Progressing Goal: Will not experience complications related to urinary retention 12/01/2022 1419 by Emilio Aspen, RN Outcome: Adequate for Discharge 12/01/2022 0848 by Emilio Aspen, RN Outcome: Progressing   Problem: Pain Managment: Goal: General experience of comfort will improve 12/01/2022 1419 by Emilio Aspen, RN Outcome: Adequate for Discharge 12/01/2022 0848 by Emilio Aspen, RN Outcome: Progressing   Problem: Safety: Goal: Ability to remain free from injury will improve 12/01/2022 1419 by Emilio Aspen, RN Outcome: Adequate for Discharge 12/01/2022 0848 by Emilio Aspen, RN Outcome: Progressing   Problem: Skin Integrity: Goal: Risk for impaired skin integrity will decrease 12/01/2022 1419 by Emilio Aspen, RN Outcome: Adequate for Discharge 12/01/2022 0848 by Emilio Aspen, RN Outcome: Progressing

## 2022-12-02 ENCOUNTER — Telehealth: Payer: Self-pay | Admitting: *Deleted

## 2022-12-02 ENCOUNTER — Telehealth: Payer: Self-pay | Admitting: Nurse Practitioner

## 2022-12-02 ENCOUNTER — Ambulatory Visit: Payer: Self-pay

## 2022-12-02 NOTE — Chronic Care Management (AMB) (Signed)
   12/02/2022  Jordan Chan Oct 10, 1950 [id number]   Reason for Encounter: Patient is not currently enrolled in the CCM program. CCM status changed to previously enrolled  Alto Denver RN, MSN, CCM RN Care Manager  Chronic Care Management Direct Number: [phone]

## 2022-12-02 NOTE — Transitions of Care (Post Inpatient/ED Visit) (Signed)
   12/02/2022  Name: Jordan Chan MRN: [id number] DOB: [birth year]  Today's TOC FU Call Status: Today's TOC FU Call Status:: Successful TOC FU Call Competed TOC FU Call Complete Date: 12/02/22  Transition Care Management Follow-up Telephone Call Date of Discharge: 12/01/22 Discharge Facility: [facility]) Type of Discharge: Inpatient Admission Primary Inpatient Discharge Diagnosis:: Acute respiratory failure How have you been since you were released from the hospital?: Better Any questions or concerns?: No  Items Reviewed: Medications obtained and verified?: Yes (Medications Reviewed) Any new allergies since your discharge?: No Do you have support at home?: Yes People in Home: spouse Name of Support/Comfort Primary Source: [facility] and Equipment/Supplies: Were Home Health Services Ordered?: No Any new equipment or medical supplies ordered?: No  Functional Questionnaire: Do you need assistance with bathing/showering or dressing?: No Do you need assistance with meal preparation?: Yes Do you need assistance with eating?: No Do you have difficulty maintaining continence: No Do you need assistance with getting out of bed/getting out of a chair/moving?: No  Follow up appointments reviewed: PCP Follow-up appointment confirmed?: Yes Date of PCP follow-up appointment?: 12/11/22 Follow-up Provider: Aura Dials 11:20 Specialist [facility] Follow-up appointment confirmed?: NA Do you need transportation to your follow-up appointment?: No Do you understand care options if your condition(s) worsen?: Yes-patient verbalized understanding  SDOH Interventions Today    Flowsheet Row Most Recent Value  SDOH Interventions   Food Insecurity Interventions Intervention Not Indicated  Housing Interventions Intervention Not Indicated  Transportation Interventions Intervention Not Indicated      Interventions Today    Flowsheet Row Most Recent Value   General Interventions   General Interventions Discussed/Reviewed General Interventions Discussed, General Interventions Reviewed, Doctor Visits  Doctor Visits Discussed/Reviewed Doctor Visits Discussed, Doctor Visits Reviewed      [facility] Interventions Today    Flowsheet Row Most Recent Value  TOC Interventions   TOC Interventions Discussed/Reviewed TOC Interventions Discussed, TOC Interventions Reviewed, Arranged PCP follow up within 7 days/Care Guide scheduled      Patient declined Care Coordination services   Gean Maidens BSN RN [facility] [phone]

## 2022-12-02 NOTE — Telephone Encounter (Signed)
Contacted Jordan Chan to schedule their annual wellness visit. Appointment made for 12/23/2022.  Jordan Chan; Care Guide Ambulatory Clinical Support [place] [facility] Direct Dial: [phone]

## 2022-12-03 ENCOUNTER — Ambulatory Visit (INDEPENDENT_AMBULATORY_CARE_PROVIDER_SITE_OTHER): Payer: Medicare HMO

## 2022-12-03 DIAGNOSIS — E538 Deficiency of other specified B group vitamins: Secondary | ICD-10-CM

## 2022-12-03 MED ORDER — CYANOCOBALAMIN 1000 MCG/ML IJ SOLN
1000.0000 ug | Freq: Once | INTRAMUSCULAR | Status: AC
Start: 2022-12-03 — End: 2022-12-03
  Administered 2022-12-03: 1000 ug via INTRAMUSCULAR

## 2022-12-05 ENCOUNTER — Telehealth: Payer: Self-pay | Admitting: *Deleted

## 2022-12-05 NOTE — Progress Notes (Unsigned)
  Care Coordination  Outreach Note  12/05/2022 Name: Jordan Chan MRN: [id number] DOB: July 03, 1951   Care Coordination Outreach Attempts: An unsuccessful telephone outreach was attempted today to offer the patient information about available care coordination services.  Follow Up Plan:  Additional outreach attempts will be made to offer the patient care coordination information and services.   Encounter Outcome:  No Answer  Burman Nieves, CCMA Care Coordination Care Guide Direct Dial: [phone]

## 2022-12-07 ENCOUNTER — Other Ambulatory Visit: Payer: Self-pay | Admitting: Nurse Practitioner

## 2022-12-07 NOTE — Patient Instructions (Signed)

## 2022-12-08 NOTE — Progress Notes (Signed)
  Care Coordination   Note   12/08/2022 Name: STEPHANIE SOTELLO MRN: [id number] DOB: [birth year]  [name] is a 72 y.o. year old female who sees Aura Dials T, NP for primary care. I reached out to [name] by phone today to offer care coordination services.  Ms. Damschroder was given information about Care Coordination services today including:   The Care Coordination services include support from the care team which includes your Nurse Coordinator, Clinical Social Worker, or Pharmacist.  The Care Coordination team is here to help remove barriers to the health concerns and goals most important to you. Care Coordination services are voluntary, and the patient may decline or stop services at any time by request to their care team member.   Care Coordination Consent Status: Patient agreed to services and verbal consent obtained.   Follow up plan:  Telephone appointment with care coordination team member scheduled for:  12/09/2022  Encounter Outcome:  Pt. Scheduled from referral   Burman Nieves, [facility] Care Coordination Care Guide Direct Dial: [phone]

## 2022-12-08 NOTE — Telephone Encounter (Signed)
Unable to refill per protocol, Rx request is too soon. Last refill 12/04/21 for 90 and 4 refills.  Requested Prescriptions  Pending Prescriptions Disp Refills   metFORMIN (GLUCOPHAGE) 1000 MG tablet [Pharmacy Med Name: METFORMIN HYDROCHLORIDE 1000 MG Tablet] 180 tablet 3    Sig: TAKE 1 TABLET TWICE DAILY WITH MEALS     Endocrinology:  Diabetes - Biguanides Failed - 12/07/2022  2:52 AM      Failed - Cr in normal range and within 360 days    Creatinine  Date Value Ref Range Status  09/14/2013 0.68 0.60 - 1.30 mg/dL Final   Creatinine, Ser  Date Value Ref Range Status  12/01/2022 1.05 (H) 0.44 - 1.00 mg/dL Final         Failed - HBA1C is between 0 and 7.9 and within 180 days    Hemoglobin A1C  Date Value Ref Range Status  04/28/2016 7.7%  Final   HB A1C (BAYER DCA - WAIVED)  Date Value Ref Range Status  06/24/2022 7.6 (H) 4.8 - 5.6 % Final    Comment:             Prediabetes: 5.7 - 6.4          Diabetes: >6.4          Glycemic control for adults with diabetes: <7.0    Hgb A1c MFr Bld  Date Value Ref Range Status  09/24/2022 8.5 (H) 4.8 - 5.6 % Final    Comment:             Prediabetes: 5.7 - 6.4          Diabetes: >6.4          Glycemic control for adults with diabetes: <7.0          Failed - eGFR in normal range and within 360 days    EGFR (African American)  Date Value Ref Range Status  09/14/2013 >60  Final   GFR calc Af Amer  Date Value Ref Range Status  07/26/2020 92 >59 mL/min/1.73 Final    Comment:    **In accordance with recommendations from the NKF-ASN Task force,**   [facility] is in the process of updating its eGFR calculation to the   2021 CKD-EPI creatinine equation that estimates kidney function   without a race variable.    EGFR (Non-African Amer.)  Date Value Ref Range Status  09/14/2013 >60  Final    Comment:    eGFR values <54mL/min/1.73 m2 may be an indication of chronic kidney disease (CKD). Calculated eGFR is useful in patients with stable  renal function. The eGFR calculation will not be reliable in acutely ill patients when serum creatinine is changing rapidly. It is not useful in  patients on dialysis. The eGFR calculation may not be applicable to patients at the low and high extremes of body sizes, pregnant women, and vegetarians.    GFR, Estimated  Date Value Ref Range Status  12/01/2022 56 (L) >60 mL/min Final    Comment:    (NOTE) Calculated using the CKD-EPI Creatinine Equation (2021)    eGFR  Date Value Ref Range Status  09/24/2022 50 (L) >59 mL/min/1.73 Final         Failed - B12 Level in normal range and within 720 days    Vitamin B-12  Date Value Ref Range Status  09/24/2022 >2000 (H) 232 - 1245 pg/mL Final         Passed - Valid encounter within last 6 months  Recent Outpatient Visits           2 months ago Insulin dependent type 2 diabetes mellitus (HCC)   [place] [facility] Lind, [place] T, NP   4 months ago COPD with acute exacerbation (HCC)   [place] [facility] Mecum, Erin E, PA-C   5 months ago Insulin dependent type 2 diabetes mellitus (HCC)   [place] [facility] Timblin, [place] T, NP   6 months ago Depression, major, single episode, in partial remission (HCC)   [place] [facility] [place], [place] T, NP   8 months ago Insulin dependent type 2 diabetes mellitus (HCC)   [place] [facility] [place], Dorie Rank, NP       Future Appointments             In 3 days Marjie Skiff, NP [place] [facility]   In 2 weeks [place], Dorie Rank, NP [place] [facility]   In 4 months End, Cristal Deer, MD  [facility] - CBC within normal limits and completed in the last 12 months    WBC  Date Value Ref Range Status  12/01/2022 15.2 (H) 4.0 - 10.5 K/uL Final   RBC  Date Value Ref Range Status  12/01/2022 3.95 3.87 - 5.11  MIL/uL Final   Hemoglobin  Date Value Ref Range Status  12/01/2022 11.8 (L) 12.0 - 15.0 g/dL Final  16/05/9603 54.0 11.1 - 15.9 g/dL Final   HCT  Date Value Ref Range Status  12/01/2022 37.5 36.0 - 46.0 % Final   Hematocrit  Date Value Ref Range Status  09/24/2022 36.6 34.0 - 46.6 % Final   MCHC  Date Value Ref Range Status  12/01/2022 31.5 30.0 - 36.0 g/dL Final   [facility]  Date Value Ref Range Status  12/01/2022 29.9 26.0 - 34.0 pg Final   MCV  Date Value Ref Range Status  12/01/2022 94.9 80.0 - 100.0 fL Final  09/24/2022 94 79 - 97 fL Final  09/14/2013 89 80 - 100 fL Final   No results found for: "PLTCOUNTKUC", "LABPLAT", "POCPLA" RDW  Date Value Ref Range Status  12/01/2022 13.2 11.5 - 15.5 % Final  09/24/2022 12.6 11.7 - 15.4 % Final  09/14/2013 14.7 (H) 11.5 - 14.5 % Final

## 2022-12-09 ENCOUNTER — Ambulatory Visit: Payer: Self-pay | Admitting: *Deleted

## 2022-12-09 ENCOUNTER — Encounter: Payer: Self-pay | Admitting: *Deleted

## 2022-12-09 NOTE — Patient Outreach (Signed)
Care Coordination   Initial Visit Note   12/09/2022 Name: Jordan Chan MRN: [id number] DOB: 1951-07-15  Jordan Chan is a 72 y.o. year old female who sees Aura Dials T, NP for primary care. I spoke with  Jordan Chan by phone today.  What matters to the patients health and wellness today?  Recover from pneumonia, recently hospitalized.      Goals Addressed             This Visit's Progress    Recover from Pneumonia and manage COPD       Care Coordination Interventions: Provided patient with basic written and verbal COPD education on self care/management/and exacerbation prevention Advised patient to track and manage COPD triggers Provided written and verbal instructions on pursed lip breathing and utilized returned demonstration as teach back Provided instruction about proper use of medications used for management of COPD including inhalers Advised patient to self assesses COPD action plan zone and make appointment with provider if in the yellow zone for 48 hours without improvement Screening for signs and symptoms of depression related to chronic disease state  Assessed social determinant of health barriers         SDOH assessments and interventions completed:  Yes  SDOH Interventions    Flowsheet Row Telephone from 12/02/2022 in [facility] Care Coordination ED to Hosp-Admission (Discharged) from 11/24/2022 in [facility] (1[facility]) Telephone from 07/21/2022 in [facility] Care Coordination ED to Hosp-Admission (Discharged) from 07/13/2022 in [facility] ([facility]) Chronic Care Management from 06/16/2022 in [facility] Office Visit from 05/20/2022 in [facility]  SDOH Interventions        Food Insecurity Interventions Intervention Not Indicated Intervention Not Indicated Intervention Not Indicated -- -- --   Housing Interventions Intervention Not Indicated Intervention Not Indicated -- Inpatient TOC -- --  Transportation Interventions Intervention Not Indicated Intervention Not Indicated Intervention Not Indicated  [husband provides transportation] -- Intervention Not Indicated --  Utilities Interventions -- Intervention Not Indicated -- -- -- --  Depression Interventions/Treatment  -- -- -- -- -- Currently on Treatment  Financial Strain Interventions -- -- -- -- Intervention Not Indicated --          Care Coordination Interventions:  Yes, provided   Interventions Today    Flowsheet Row Most Recent Value  Chronic Disease   Chronic disease during today's visit Chronic Obstructive Pulmonary Disease (COPD)  General Interventions   General Interventions Discussed/Reviewed General Interventions Reviewed, Doctor Visits, Communication with, Durable Medical Equipment (DME), Labs  Labs Hgb A1c every 3 months  [A1C currently 8.5]  Doctor Visits Discussed/Reviewed Doctor Visits Reviewed, PCP  [facility] visit on 5/9 and Pulmonary on 7/9]  Durable Medical Equipment (DME) Oxygen  [has home O2 for night time supplied by [employer]]  PCP/Specialist Visits Compliance with follow-up visit  Communication with PCP/Specialists  [Call placed to ortho office as patient is to have hip replacement on 6/6 to discuss if she is still candidate with now finishing prednisone.  Office will call patient directly]  Education Interventions   Education Provided Provided Education  Provided Verbal Education On Nutrition, Blood Sugar Monitoring, Medication  [Was taking prednisone for PNE, last dose 5/6, blood sugars range 160s, was up to 300 when taking prednisone]        Follow up plan: Follow up call scheduled for 5/24    Encounter Outcome:  Pt. Visit Completed  Valente Devinn, RN, MSN, [place] [facility] Care Management Coordinator [phone]

## 2022-12-11 ENCOUNTER — Ambulatory Visit (INDEPENDENT_AMBULATORY_CARE_PROVIDER_SITE_OTHER): Payer: Medicare HMO | Admitting: Nurse Practitioner

## 2022-12-11 ENCOUNTER — Encounter: Payer: Self-pay | Admitting: Nurse Practitioner

## 2022-12-11 VITALS — BP 130/74 | HR 98 | Temp 97.9°F | Ht 58.5 in | Wt 145.5 lb

## 2022-12-11 DIAGNOSIS — J9601 Acute respiratory failure with hypoxia: Secondary | ICD-10-CM | POA: Diagnosis not present

## 2022-12-11 DIAGNOSIS — E876 Hypokalemia: Secondary | ICD-10-CM

## 2022-12-11 DIAGNOSIS — G4733 Obstructive sleep apnea (adult) (pediatric): Secondary | ICD-10-CM

## 2022-12-11 DIAGNOSIS — E871 Hypo-osmolality and hyponatremia: Secondary | ICD-10-CM

## 2022-12-11 DIAGNOSIS — E1159 Type 2 diabetes mellitus with other circulatory complications: Secondary | ICD-10-CM

## 2022-12-11 DIAGNOSIS — I482 Chronic atrial fibrillation, unspecified: Secondary | ICD-10-CM | POA: Diagnosis not present

## 2022-12-11 DIAGNOSIS — Z794 Long term (current) use of insulin: Secondary | ICD-10-CM | POA: Diagnosis not present

## 2022-12-11 DIAGNOSIS — F324 Major depressive disorder, single episode, in partial remission: Secondary | ICD-10-CM

## 2022-12-11 DIAGNOSIS — J189 Pneumonia, unspecified organism: Secondary | ICD-10-CM

## 2022-12-11 DIAGNOSIS — E1169 Type 2 diabetes mellitus with other specified complication: Secondary | ICD-10-CM

## 2022-12-11 DIAGNOSIS — I152 Hypertension secondary to endocrine disorders: Secondary | ICD-10-CM | POA: Diagnosis not present

## 2022-12-11 DIAGNOSIS — A419 Sepsis, unspecified organism: Secondary | ICD-10-CM

## 2022-12-11 DIAGNOSIS — I428 Other cardiomyopathies: Secondary | ICD-10-CM

## 2022-12-11 DIAGNOSIS — G894 Chronic pain syndrome: Secondary | ICD-10-CM

## 2022-12-11 DIAGNOSIS — R6520 Severe sepsis without septic shock: Secondary | ICD-10-CM

## 2022-12-11 DIAGNOSIS — I5022 Chronic systolic (congestive) heart failure: Secondary | ICD-10-CM | POA: Diagnosis not present

## 2022-12-11 DIAGNOSIS — E119 Type 2 diabetes mellitus without complications: Secondary | ICD-10-CM

## 2022-12-11 DIAGNOSIS — N1831 Chronic kidney disease, stage 3a: Secondary | ICD-10-CM

## 2022-12-11 DIAGNOSIS — I700 Atherosclerosis of aorta: Secondary | ICD-10-CM

## 2022-12-11 DIAGNOSIS — E785 Hyperlipidemia, unspecified: Secondary | ICD-10-CM | POA: Diagnosis not present

## 2022-12-11 DIAGNOSIS — J449 Chronic obstructive pulmonary disease, unspecified: Secondary | ICD-10-CM

## 2022-12-11 DIAGNOSIS — Z853 Personal history of malignant neoplasm of breast: Secondary | ICD-10-CM

## 2022-12-11 LAB — BAYER DCA HB A1C WAIVED: HB A1C (BAYER DCA - WAIVED): 9.2 % — ABNORMAL HIGH (ref 4.8–5.6)

## 2022-12-11 MED ORDER — FREESTYLE LIBRE 2 READER DEVI: 5 refills | Status: AC

## 2022-12-11 MED ORDER — FREESTYLE LIBRE 2 SENSOR MISC: 5 refills | Status: DC

## 2022-12-11 MED ORDER — INSULIN LISPRO (1 UNIT DIAL) 100 UNIT/ML (KWIKPEN): 5.0000 [IU] | PEN_INJECTOR | Freq: Three times a day (TID) | SUBCUTANEOUS | 3 refills | Status: DC

## 2022-12-11 NOTE — Assessment & Plan Note (Signed)
Chronic.  Poor tolerance of CPAP mask, uses O2 night 2 L [place]. 

## 2022-12-11 NOTE — Assessment & Plan Note (Signed)
Chronic, stable.  Continue current medication regimen and adjust as needed.  Lipid panel today. 

## 2022-12-11 NOTE — Progress Notes (Signed)
BP 130/74   Pulse 98   Temp 97.9 F (36.6 C) (Oral)   Ht 4' 10.5" (1.486 m)   Wt 145 lb 8 oz (66 kg)   LMP  (LMP Unknown)   SpO2 98%   BMI 29.89 kg/m    Subjective:    Patient ID: [name], female    DOB: [birth year], 72 y.o.   MRN: [id number]  HPI: TABOR RODINE is a 72 y.o. female  Chief Complaint  Patient presents with   Hospitalization Follow-up    Was d/c'd on 12/01/22,   Transition of Care Hospital Follow up: Was admitted to [facility] on 11/24/22 and discharged 12/01/22 due to sepsis and left lower lobe pneumonia and respiratory failure.  Was treated with abx therapy and steroids.  Finished her steroid taper at home.  She is concerned this will affect her ability to get hip replacement (left) -- [facility], if steroids elevated sugars.    She will need a hospital bed if surgery, as currently sleeps in recliner and per instructions she will need to sleep in bed after surgery.  She is frustrated at this time as ortho is not returning calls in regard to surgery and she needs update -- Dr. Odis Luster is to perform surgery.  Has had clearance from pulmonary and cardiology.    "Hospital Course: RECIA KARLOVICH is a 72 y.o. female with medical history significant of COPD on night oxygen, chronic atrial fibrillation, chronic diastolic congestive heart failure, type 2 diabetes, gastroesophageal reflux disease, essential hypertension, dyslipidemia, chronic pain with a pain pump, obstructive sleep apnea, history of stroke, who present to the hospital with worsening shortness of breath, he had significant respiratory distress and hypoxemia, was placed on 5 L oxygen.  Chest x-ray showed left lower lobe pneumonia.  Patient also had a significant vomiting for the last few days, pneumonia probably due to aspiration.  She met sepsis criteria at admission with significant tachycardia, leukocytosis, mild elevation of lactic acid level. Antibiotic changed to Unasyn for aspiration pneumonia  after admission.   4/24.  With bronchospasm and wheezing started Solu-Medrol.  Switch her inhalers over to nebulizers.  Able to switch from high flow nasal cannula 4 L over the regular nasal cannula 3 L. 4/25.  Continue Solu-Medrol with bronchospasm. 4/26.  Still with bronchospasm.  Continue Solu-Medrol. 4/27.  Patient finally starting to feel better.  Still has wheeze more on the right side.  Desaturated with coming off oxygen with ambulation. 4/28.  Patient not feeling as well today.  Dropped oxygen saturations with ambulation down to 83% at its lowest. 4/28.  Patient able to hold her saturations in the 90s with ambulation.  Patient feeling well enough to go home.   Assessment and Plan: * Acute hypoxemic respiratory failure (HCC) Patient on high flow nasal cannula 4 L on 4/24.  Patient did have a pulse ox of 87% on room air on 4/22.  Patient desaturated down to 83% on 4/28.  Today patient able to hold her saturations with ambulating.  Patient feeling well enough to go home.  Patient does wear chronic oxygen at night only.  Able to come off oxygen during the day.   Lobar pneumonia (HCC) Left lobar pneumonia.  Completed Unasyn.  Continue Zithromax for 2 more doses upon going home.   Severe sepsis (HCC) Present on admission with leukocytosis tachycardia and pneumonia, lactic acidosis and acute hypoxic respiratory failure.   COPD with acute exacerbation (HCC) Continue Solu-Medrol 40 mg IV daily  and nebulizer treatments.  Incentive spirometer.  Switch over to a prednisone taper upon going home since she was slow to improve during the hospital course.   Hypokalemia Replaced   Atrial fibrillation with RVR (HCC) Paroxysmal in nature.  Patient is already rate controlled on metoprolol.  Continue with apixaban as at home   Uncontrolled type 2 diabetes mellitus with hyperglycemia, without long-term current use of insulin (HCC) Hemoglobin A1c of 8.5.  Patient had a low sugar the other day and her  insulin was decreased at night.  Advise she must eat an evening snack.  Can go back on her usual dose of Trulicity at home.  Advise she must eat an evening snack.   CKD stage 3a, GFR 45-59 ml/min (HCC) Creatinine 1.05 with GFR 56   Hyponatremia Resolved  Gastroenteritis Had diarrhea prior to coming into the hospital.   Obesity (BMI 30-39.9) BMI 31.23"  Hospital/Facility: [facility] D/C Physician: Dr. Renae Gloss D/C Date: 12/01/22  Records Requested: 12/11/22 Records Received: 12/11/22 Records Reviewed: 12/11/22  Diagnoses on Discharge: Acute hypoxemic respiratory failure (HCC)   Date of interactive Contact within 48 hours of discharge:  Contact was through: phone  Date of 7 day or 14 day face-to-face visit:    within 14 days  Outpatient Encounter Medications as of 12/11/2022  Medication Sig   ACCU-CHEK AVIVA PLUS test strip TEST THREE TIMES DAILY   Accu-Chek Softclix Lancets lancets TEST BLOOD SUGAR THREE TIMES DAILY   albuterol (PROVENTIL HFA;VENTOLIN HFA) 108 (90 BASE) MCG/ACT inhaler Inhale 2 puffs into the lungs every 6 (six) hours as needed for wheezing or shortness of breath.    albuterol (PROVENTIL) (2.5 MG/3ML) 0.083% nebulizer solution Take 3 mLs (2.5 mg total) by nebulization every 4 (four) hours as needed for wheezing or shortness of breath.   Alcohol Swabs (DROPSAFE ALCOHOL PREP) 70 % PADS USE TWICE DAILY  WITH  SUGAR  CHECKS   apixaban (ELIQUIS) 5 MG TABS tablet Take 1 tablet (5 mg total) by mouth 2 (two) times daily.   atorvastatin (LIPITOR) 40 MG tablet Take 1 tablet (40 mg total) by mouth daily.   azithromycin (ZITHROMAX) 250 MG tablet One tab po daily for two doses   Blood Glucose Monitoring Suppl (TRUE METRIX METER) w/Device KIT Use to check blood sugar 4 times a day   busPIRone (BUSPAR) 5 MG tablet Take 1 tablet (5 mg total) by mouth 2 (two) times daily.   chlorpheniramine-HYDROcodone (TUSSIONEX) 10-8 MG/5ML Take 5 mLs by mouth every 12 (twelve) hours as needed for cough.    cholecalciferol (VITAMIN D3) 25 MCG (1000 UNIT) tablet Take 1,000 Units by mouth daily.   Continuous Glucose Receiver (FREESTYLE LIBRE 2 READER) DEVI To check blood sugars 3-4 times daily due to insulin use. DX E11.40   Continuous Glucose Sensor (FREESTYLE LIBRE 2 SENSOR) MISC To check blood sugars 3-4 times daily due to insulin use. DX E11.40   Cyanocobalamin 1000 MCG/ML KIT Inject 1,000 mcg as directed every 30 (thirty) days.   cyclobenzaprine (FLEXERIL) 5 MG tablet Take 5 mg by mouth 2 (two) times daily as needed for muscle spasms. Takes very rarely   Dulaglutide (TRULICITY) 4.5 MG/0.5ML SOPN Inject 4.5 mg as directed once a week.   Fluticasone-Umeclidin-Vilant (TRELEGY ELLIPTA) 100-62.5-25 MCG/ACT AEPB Inhale 1 puff into the lungs daily.   folic acid (FOLVITE) 1 MG tablet TAKE 1 TABLET EVERY DAY   furosemide (LASIX) 40 MG tablet Take 0.5 tablets (20 mg total) by mouth daily.   gabapentin (NEURONTIN)  800 MG tablet Take 1 tablet (800 mg total) by mouth 3 (three) times daily.   insulin degludec (TRESIBA FLEXTOUCH) 100 UNIT/ML FlexTouch Pen Inject 55 Units into the skin at bedtime.   insulin lispro (HUMALOG KWIKPEN) 100 UNIT/ML KwikPen Inject 5 Units into the skin 3 (three) times daily before meals. Do not inject insulin if you are not going to eat meal.  Only take before a meal.   losartan (COZAAR) 25 MG tablet Take 0.5 tablets (12.5 mg total) by mouth daily.   metFORMIN (GLUCOPHAGE) 1000 MG tablet TAKE 1 TABLET TWICE DAILY WITH MEALS   metoprolol succinate (TOPROL-XL) 25 MG 24 hr tablet Take 0.5 tablets (12.5 mg total) by mouth daily.   montelukast (SINGULAIR) 10 MG tablet Take 1 tablet (10 mg total) by mouth at bedtime.   Nebulizers (EASY AIR COMPRESSOR NEBULIZER) MISC Use to inhaler nebulizer treatments at needed per instructions on nebulizer prescription   ondansetron (ZOFRAN) 4 MG tablet Take 1 tablet (4 mg total) by mouth every 8 (eight) hours as needed for nausea or vomiting.   PAIN  MANAGEMENT INTRATHECAL, IT, PUMP 1 each by Intrathecal route. Intrathecal (IT) medication:  Morphine Patient does not remember current. Adjusted every 2.5 months at [facility] in [place].   pantoprazole (PROTONIX) 40 MG tablet Take 1 tablet (40 mg total) by mouth daily.   venlafaxine XR (EFFEXOR-XR) 150 MG 24 hr capsule Take 1 capsule (150 mg total) by mouth daily.   [DISCONTINUED] predniSONE (DELTASONE) 10 MG tablet 4 tabs po day1; 3 tabs po day2,3; 2 tabs po day4,5; 1 tab po day6,7; 1/2 tab po day8,9   No facility-administered encounter medications on file as of 12/11/2022.    Diagnostic Tests Reviewed/Disposition:  Recent labs and imaging reviewed in chart    Latest Ref Rng & Units 12/01/2022    6:26 AM 11/28/2022    5:29 AM 11/26/2022    4:50 AM  CBC  WBC 4.0 - 10.5 K/uL 15.2  11.6  12.2   Hemoglobin 12.0 - 15.0 g/dL 16.1  09.6  04.5   Hematocrit 36.0 - 46.0 % 37.5  34.2  34.2   Platelets 150 - 400 K/uL 533  393  287        Latest Ref Rng & Units 12/01/2022    6:26 AM 11/28/2022    5:29 AM 11/26/2022    4:50 AM  CMP  Glucose 70 - 99 mg/dL 409  97  99   BUN 8 - 23 mg/dL 21  21  18    Creatinine 0.44 - 1.00 mg/dL 8.11  9.14  7.82   Sodium 135 - 145 mmol/L 139  137  135   Potassium 3.5 - 5.1 mmol/L 4.0  4.0  3.4   Chloride 98 - 111 mmol/L 96  97  100   CO2 22 - 32 mmol/L 35  32  28   Calcium 8.9 - 10.3 mg/dL 9.4  8.9  8.5     Consults: Pulmonary  Discharge Instructions: Follow-up with PCP in 5 days  Disease/illness Education: Reviewed with patient at bedside today  Home Health/Community Services Discussions/Referrals: none  Establishment or re-establishment of referral orders for community resources: none  Discussion with other health care providers: Reviewed notes in chart  Assessment and Support of treatment regimen adherence: Reviewed with patient at bedside today  Appointments Coordinated with: Reviewed with patient at bedside today  Education for  self-management, independent living, and ADLs:  Reviewed with patient at bedside today  DIABETES Last A1c was 8.5% in February 2024. Continues on Metformin 1000 MG BID, Trulicity 4.5 MG weekly, Tresiba 55 units at HS. Jardiance made her sick in past and Mounjaro gave her severe diarrhea and abdominal pain.  Works with [employer] team and provider.    Continues on monthly B12 injections monthly for low levels.  Followed hematology in past, last visit 02/11/22, received 5 iron infusions total -- continues on folic acid.  WBC remains stable.  If becomes elevated (>20) and continues to elevate they will perform bone marrow biopsy.  Hypoglycemic episodes: none Polydipsia/polyuria: no Visual disturbance: no Chest pain: no Paresthesias: no Glucose Monitoring: yes             Accucheck frequency: BID             Fasting glucose: 170 to 200             Post prandial:              Evening: upper 200 to 300             Before meals: Taking Insulin?: yes             Long acting insulin: Tresiba 55 units             Short acting insulin: Blood Pressure Monitoring: occasional Retinal Examination: Up to Date -- [facility] Foot Exam: Up to Date Pneumovax: Up to Date Influenza: Up to Date Aspirin: yes    HYPERTENSION / HYPERLIPIDEMIA/CHF Had last visit with cardiology on 10/22/22. Continues on Atorvastatin 20 MG, Metoprolol 12.5 MG daily, Lasix 40 MG daily, Losartan 12.5 MG daily, + ASA.  Currently on Eliquis.    Echo 10/22/22 some improvement with EF 45-50%.  Satisfied with current treatment? yes Duration of hypertension: chronic BP monitoring frequency: not currently BP range:  BP medication side effects: no Duration of hyperlipidemia: chronic Cholesterol medication side effects: no Cholesterol supplements: none Medication compliance: good compliance Aspirin: yes Recent stressors: no Recurrent headaches: no Visual changes: no Palpitations: no Dyspnea: baseline, no worsening Chest  pain:no Lower extremity edema: no Dizzy/lightheaded: no    ATRIAL FIBRILLATION Continues on Eliquis and Metoprolol. Atrial fibrillation status: stable Satisfied with current treatment: yes  Medication side effects:  no Medication compliance: good compliance Etiology of atrial fibrillation: unknown Palpitations:  no Chest pain:  no Dyspnea on exertion:  baseline, no worsening Orthopnea:  no Syncope:  no Edema:  no Ventricular rate control: B-blocker Anti-coagulation: long acting    CHRONIC KIDNEY DISEASE CKD status: stable Medications renally dose: yes Previous renal evaluation: no Pneumovax:  Up to Date Influenza Vaccine:  Up to Date    COPD Follows with pulmonary (Dr. Meredeth Ide) for COPD -- last saw 10/09/22.  Currently using Trelegy, Albuterol, and Singulair.     Has a history of smoking, quit 15 years ago. She does not use CPAP, but continues to use O2 at night 2 L == sleeps in recliner at baseline, has since 1998. COPD status: controlled Satisfied with current treatment?: yes Oxygen use: no Dyspnea frequency: baseline, no worsening Cough frequency: none Rescue inhaler frequency: very seldom Limitation of activity: no Productive cough: none Last Spirometry: with pulmonary Pneumovax: Up to Date Influenza: Up to Date   CHRONIC PAIN: Followed by [facility] in [place], last seen 09/11/22 -- continues pain pump + Gabapentin.  Followed by clinic nurse for pump check and replacement. Reports pain is well-controlled with current regimen -- at baseline for her.  Vitamin D on low side past labs, taking supplement with recent levels stable.  No recent falls or fractures.  Normal DEXA 2017.  Relevant past medical, surgical, family and social history reviewed and updated as indicated. Interim medical history since our last visit reviewed. Allergies and medications reviewed and updated.  Review of Systems  Constitutional:  Negative for activity change, appetite change,  diaphoresis, fatigue and fever.  Respiratory:  Positive for shortness of breath. Negative for cough, chest tightness and wheezing.   Cardiovascular:  Negative for chest pain, palpitations and leg swelling.  Gastrointestinal: Negative.   Endocrine: Negative for heat intolerance, polydipsia, polyphagia and polyuria.  Neurological: Negative.   Psychiatric/Behavioral: Negative.      Per HPI unless specifically indicated above     Objective:    BP 130/74   Pulse 98   Temp 97.9 F (36.6 C) (Oral)   Ht 4' 10.5" (1.486 m)   Wt 145 lb 8 oz (66 kg)   LMP  (LMP Unknown)   SpO2 98%   BMI 29.89 kg/m   Wt Readings from Last 3 Encounters:  12/11/22 145 lb 8 oz (66 kg)  11/24/22 152 lb (68.9 kg)  10/22/22 150 lb (68 kg)    Physical Exam Vitals and nursing note reviewed.  Constitutional:      General: She is awake. She is not in acute distress.    Appearance: She is well-developed and well-groomed. She is not ill-appearing.  HENT:     Head: Normocephalic.     Right Ear: Hearing normal.     Left Ear: Hearing normal.  Eyes:     General: Lids are normal.        Right eye: No discharge.        Left eye: No discharge.     Conjunctiva/sclera: Conjunctivae normal.     Pupils: Pupils are equal, round, and reactive to light.  Neck:     Thyroid: No thyromegaly.     Vascular: No carotid bruit.  Cardiovascular:     Rate and Rhythm: Normal rate and regular rhythm.     Heart sounds: Normal heart sounds. No murmur heard.    No gallop.  Pulmonary:     Effort: Pulmonary effort is normal. No accessory muscle usage or respiratory distress.     Breath sounds: Normal breath sounds. No decreased breath sounds, wheezing or rhonchi.  Abdominal:     General: Bowel sounds are normal.     Palpations: Abdomen is soft.  Musculoskeletal:     Cervical back: Normal range of motion and neck supple.     Right lower leg: No edema.     Left lower leg: No edema.  Lymphadenopathy:     Cervical: No cervical  adenopathy.  Skin:    General: Skin is warm and dry.  Neurological:     Mental Status: She is alert and oriented to person, place, and time.  Psychiatric:        Attention and Perception: Attention normal.        Mood and Affect: Mood normal.        Speech: Speech normal.        Behavior: Behavior normal. Behavior is cooperative.        Thought Content: Thought content normal.    Results for orders placed or performed in visit on 12/11/22  Bayer DCA Hb A1c Waived  Result Value Ref Range   HB A1C (BAYER DCA - WAIVED) 9.2 (H) 4.8 - 5.6 %  Assessment & Plan:   Problem List Items Addressed This Visit       Cardiovascular and Mediastinum   Chronic atrial fibrillation with RVR (HCC)    Chronic, ongoing.  Continue current medication regimen and collaboration with cardiology.  Recent notes reviewed.      Relevant Orders   Comprehensive metabolic panel   Lipid Panel w/o Chol/HDL Ratio   Chronic HFrEF (heart failure with reduced ejection fraction) (HCC)    Chronic, ongoing with most recent EF 45-50% (10/22/22), followed by cardiology.  Euvolemic today.  Continue current medication regimen and collaboration with cardiology.  Recommend: - Reminded to call for an overnight weight gain of >2 pounds or a weekly weight gain of >5 pounds - not adding salt to food and read food labels. Reviewed the importance of keeping daily sodium intake to 2000mg  daily - Avoid Ibuprofen products      Relevant Orders   Comprehensive metabolic panel   Lipid Panel w/o Chol/HDL Ratio   Hypertension associated with diabetes (HCC)    Chronic, stable.  BP at goal on office check today.  Recommend she continue to monitor BP at home regularly.  Focus on DASH diet.  Continue current medication regimen and adjust as needed, refills sent as needed.  LABS: CBC, CMP.  Return in 3 months.       Relevant Medications   insulin lispro (HUMALOG KWIKPEN) 100 UNIT/ML KwikPen   Other Relevant Orders   Bayer DCA Hb  A1c Waived (Completed)   Comprehensive metabolic panel     Respiratory   Acute hypoxemic respiratory failure (HCC)    Acute and improving.  Continue collaboration with pulmonary and recheck labs today.      Relevant Orders   CBC with Differential/Platelet   Community acquired pneumonia of left lower lobe of lung    Acute and improved at this time.  Has completed all treatment.  Will plan on repeat CXR at next visit in 4 weeks.        Relevant Orders   CBC with Differential/Platelet   COPD, moderate (HCC)    Chronic, stable. Minimal use of Albuterol. Continue Trelegy which offers benefit of medication minimization and has benefited symptoms.  Continue to collaborate with Dr. Meredeth Ide, recent notes reviewed.  No current daytime O2, at night only.        Relevant Orders   CBC with Differential/Platelet   Comprehensive metabolic panel   Sleep apnea    Chronic.  Poor tolerance of CPAP mask, uses O2 night 2 L [place].        Endocrine   Hyperlipidemia associated with type 2 diabetes mellitus (HCC)    Chronic, stable.  Continue current medication regimen and adjust as needed.  Lipid panel today.      Relevant Medications   insulin lispro (HUMALOG KWIKPEN) 100 UNIT/ML KwikPen   Other Relevant Orders   Bayer DCA Hb A1c Waived (Completed)   Comprehensive metabolic panel   Lipid Panel w/o Chol/HDL Ratio   Insulin dependent type 2 diabetes mellitus (HCC)    Chronic, ongoing, insulin dependent. A1c 9.2% today, trend up after recent steroids.  Discussed at length with her today.  Will alert ortho to result.  - Will continue Metformin at max dosing, Trulicity 4.5 MG weekly, and Tresiba 55 units, increase 3 units in 3 days if fasting sugar not less then 130 consistently recommended.  Start meal time insulin, starting at 5 units prior to meals and will increase as needed.  Educated her on  this.  Reviewed CCM team pharmacist note for further recommendations.   - Work on Environmental manager for her  since will be injecting insulin 4 times a day and risk for hypoglycemia.  Provided DEXCOM sample in office today and attempted to set up, but unsure if will work with her current phone.  She will take home and work on this with her son-in-law. - Recommend she continue to monitor BS consistently at home and document + focus heavily on diet changes.  She is aware to notify provider if fasting BS >130 consistently or <70, as may need to adjust insulin further.   - In past poorly tolerated Jardiance and Mounjaro -- discussed with her that and how SGLT2 would benefit HF -- however concern for side effects.   - Return in 4 weeks.       Relevant Medications   insulin lispro (HUMALOG KWIKPEN) 100 UNIT/ML KwikPen   Other Relevant Orders   Bayer DCA Hb A1c Waived (Completed)     Genitourinary   CKD stage 3a, GFR 45-59 ml/min (HCC)    Ongoing and stable.  Continue Losartan at low dose for heart and kidney protection.  Check CMP today.  Will refer to nephrology if any worsening in future.      Relevant Orders   CBC with Differential/Platelet     Other   Chronic pain syndrome    Chronic.  Continue to collaborate with chronic pain clinic in [facility].  Has morphine pump.        Hypokalemia    Recheck today and provide supplement as needed.      Relevant Orders   Comprehensive metabolic panel   Hyponatremia    Recheck today and focus on diet at this time.      Relevant Orders   Comprehensive metabolic panel   Severe sepsis (HCC) - Primary    Acute and improved at this time.  Recheck CBC and CMP today.      Relevant Orders   CBC with Differential/Platelet     Follow up plan: Return in about 4 weeks (around 01/08/2023) for T2DM -- started Humalog before meals.

## 2022-12-11 NOTE — Assessment & Plan Note (Signed)
Acute and improved at this time.  Recheck CBC and CMP today.

## 2022-12-11 NOTE — Assessment & Plan Note (Signed)
Acute and improved at this time.  Has completed all treatment.  Will plan on repeat CXR at next visit in 4 weeks.

## 2022-12-11 NOTE — Assessment & Plan Note (Signed)
Chronic.  Continue to collaborate with chronic pain clinic in [place].  Has morphine pump.   

## 2022-12-11 NOTE — Assessment & Plan Note (Signed)
Chronic, stable.  BP at goal on office check today.  Recommend she continue to monitor BP at home regularly.  Focus on DASH diet.  Continue current medication regimen and adjust as needed, refills sent as needed.  LABS: CBC, CMP.  Return in 3 months.

## 2022-12-11 NOTE — Assessment & Plan Note (Signed)
Acute and improving.  Continue collaboration with pulmonary and recheck labs today.

## 2022-12-11 NOTE — Assessment & Plan Note (Signed)
Chronic, stable. Minimal use of Albuterol. Continue Trelegy which offers benefit of medication minimization and has benefited symptoms.  Continue to collaborate with Dr. Fleming, recent notes reviewed.  No current daytime O2, at night only.   

## 2022-12-11 NOTE — Assessment & Plan Note (Signed)
Chronic, ongoing, insulin dependent. A1c 9.2% today, trend up after recent steroids.  Discussed at length with her today.  Will alert ortho to result.  - Will continue Metformin at max dosing, Trulicity 4.5 MG weekly, and Tresiba 55 units, increase 3 units in 3 days if fasting sugar not less then 130 consistently recommended.  Start meal time insulin, starting at 5 units prior to meals and will increase as needed.  Educated her on this.  Reviewed CCM team pharmacist note for further recommendations.   - Work on Environmental manager for her since will be injecting insulin 4 times a day and risk for hypoglycemia.  Provided DEXCOM sample in office today and attempted to set up, but unsure if will work with her current phone.  She will take home and work on this with her son-in-law. - Recommend she continue to monitor BS consistently at home and document + focus heavily on diet changes.  She is aware to notify provider if fasting BS >130 consistently or <70, as may need to adjust insulin further.   - In past poorly tolerated Jardiance and Mounjaro -- discussed with her that and how SGLT2 would benefit HF -- however concern for side effects.   - Return in 4 weeks.

## 2022-12-11 NOTE — Assessment & Plan Note (Signed)
Chronic, ongoing.  Continue current medication regimen and collaboration with cardiology.  Recent notes reviewed.

## 2022-12-11 NOTE — Assessment & Plan Note (Signed)
Ongoing and stable.  Continue Losartan at low dose for heart and kidney protection.  Check CMP today.  Will refer to nephrology if any worsening in future. 

## 2022-12-11 NOTE — Assessment & Plan Note (Addendum)
Chronic, ongoing with most recent EF 45-50% (10/22/22), followed by cardiology.  Euvolemic today.  Continue current medication regimen and collaboration with cardiology.  Recommend: - Reminded to call for an overnight weight gain of >2 pounds or a weekly weight gain of >5 pounds - not adding salt to food and read food labels. Reviewed the importance of keeping daily sodium intake to 2000mg  daily - Avoid Ibuprofen products

## 2022-12-11 NOTE — Assessment & Plan Note (Signed)
Recheck today and provide supplement as needed.

## 2022-12-11 NOTE — Assessment & Plan Note (Signed)
Recheck today and focus on diet at this time.

## 2022-12-12 LAB — COMPREHENSIVE METABOLIC PANEL
ALT: 18 IU/L (ref 0–32)
AST: 14 IU/L (ref 0–40)
Albumin/Globulin Ratio: 1.3 (ref 1.2–2.2)
Albumin: 4.3 g/dL (ref 3.8–4.8)
Alkaline Phosphatase: 80 IU/L (ref 44–121)
BUN/Creatinine Ratio: 12 (ref 12–28)
BUN: 14 mg/dL (ref 8–27)
Bilirubin Total: 0.3 mg/dL (ref 0.0–1.2)
CO2: 22 mmol/L (ref 20–29)
Calcium: 9.9 mg/dL (ref 8.7–10.3)
Chloride: 94 mmol/L — ABNORMAL LOW (ref 96–106)
Creatinine, Ser: 1.14 mg/dL — ABNORMAL HIGH (ref 0.57–1.00)
Globulin, Total: 3.3 g/dL (ref 1.5–4.5)
Glucose: 260 mg/dL — ABNORMAL HIGH (ref 70–99)
Potassium: 4.7 mmol/L (ref 3.5–5.2)
Sodium: 137 mmol/L (ref 134–144)
Total Protein: 7.6 g/dL (ref 6.0–8.5)
eGFR: 51 mL/min/{1.73_m2} — ABNORMAL LOW (ref >59)

## 2022-12-12 LAB — LIPID PANEL W/O CHOL/HDL RATIO
Cholesterol, Total: 169 mg/dL (ref 100–199)
HDL: 68 mg/dL (ref >39)
LDL Chol Calc (NIH): 75 mg/dL (ref 0–99)
Triglycerides: 151 mg/dL — ABNORMAL HIGH (ref 0–149)
VLDL Cholesterol Cal: 26 mg/dL (ref 5–40)

## 2022-12-12 LAB — CBC WITH DIFFERENTIAL/PLATELET
Basophils Absolute: 0.1 10*3/uL (ref 0.0–0.2)
Basos: 1 %
EOS (ABSOLUTE): 0.3 10*3/uL (ref 0.0–0.4)
Eos: 2 %
Hematocrit: 40.1 % (ref 34.0–46.6)
Hemoglobin: 12.9 g/dL (ref 11.1–15.9)
Immature Grans (Abs): 0.1 10*3/uL (ref 0.0–0.1)
Immature Granulocytes: 1 %
Lymphocytes Absolute: 3 10*3/uL (ref 0.7–3.1)
Lymphs: 26 %
MCH: 30.2 pg (ref 26.6–33.0)
MCHC: 32.2 g/dL (ref 31.5–35.7)
MCV: 94 fL (ref 79–97)
Monocytes Absolute: 0.9 10*3/uL (ref 0.1–0.9)
Monocytes: 8 %
Neutrophils Absolute: 7.1 10*3/uL — ABNORMAL HIGH (ref 1.4–7.0)
Neutrophils: 62 %
Platelets: 483 10*3/uL — ABNORMAL HIGH (ref 150–450)
RBC: 4.27 x10E6/uL (ref 3.77–5.28)
RDW: 12.7 % (ref 11.7–15.4)
WBC: 11.3 10*3/uL — ABNORMAL HIGH (ref 3.4–10.8)

## 2022-12-12 NOTE — Progress Notes (Signed)
Good morning, please let Jordan Chan know her labs have returned: - CBC shows ongoing mild elevation in white blood cell count and neutrophils + mild elevation in platelets -- however these are trending down.  Good news!!  Appear to be improving. - Kidney function showing some ongoing mild stage 3a kidney disease which we will monitor closely.   - Cholesterol labs stable.  How is meal insulin going?  Were you able to get Dexcom running?  I sent in Freestyle order and we will work on PA for this if needed as I feel that will be better monitor for you.  Any questions? Keep being amazing!!  Thank you for allowing me to participate in your care.  I appreciate you. Kindest regards, Rudie Sermons

## 2022-12-15 NOTE — Progress Notes (Signed)
Spoke with patient today and she stated that her Freestyle is at the pharmacy waiting on her to pick it up

## 2022-12-20 NOTE — Patient Instructions (Signed)

## 2022-12-23 ENCOUNTER — Encounter: Payer: Self-pay | Admitting: Nurse Practitioner

## 2022-12-23 ENCOUNTER — Ambulatory Visit (INDEPENDENT_AMBULATORY_CARE_PROVIDER_SITE_OTHER): Payer: Medicare HMO | Admitting: Nurse Practitioner

## 2022-12-23 ENCOUNTER — Ambulatory Visit (INDEPENDENT_AMBULATORY_CARE_PROVIDER_SITE_OTHER): Payer: Medicare HMO

## 2022-12-23 ENCOUNTER — Telehealth: Payer: Self-pay | Admitting: Nurse Practitioner

## 2022-12-23 VITALS — BP 99/62 | HR 85 | Temp 98.1°F | Ht 58.5 in | Wt 149.6 lb

## 2022-12-23 VITALS — BP 99/62 | Ht 58.5 in | Wt 149.0 lb

## 2022-12-23 DIAGNOSIS — F324 Major depressive disorder, single episode, in partial remission: Secondary | ICD-10-CM

## 2022-12-23 DIAGNOSIS — Z794 Long term (current) use of insulin: Secondary | ICD-10-CM

## 2022-12-23 DIAGNOSIS — Z0000 Encounter for general adult medical examination without abnormal findings: Secondary | ICD-10-CM

## 2022-12-23 DIAGNOSIS — E119 Type 2 diabetes mellitus without complications: Secondary | ICD-10-CM | POA: Diagnosis not present

## 2022-12-23 DIAGNOSIS — Z780 Asymptomatic menopausal state: Secondary | ICD-10-CM

## 2022-12-23 DIAGNOSIS — J189 Pneumonia, unspecified organism: Secondary | ICD-10-CM

## 2022-12-23 NOTE — Progress Notes (Signed)
Subjective:   Jordan Chan is a 72 y.o. female who presents for Medicare Annual (Subsequent) preventive examination.  Review of Systems     Cardiac Risk Factors include: advanced age (>18men, >29 women);diabetes mellitus;hypertension     Objective:    Today's Vitals   12/23/22 0950  PainSc: 7    There is no height or weight on file to calculate BMI.     12/23/2022    9:55 AM 11/24/2022    9:21 PM 07/13/2022   11:15 PM 07/13/2022    9:41 AM 05/07/2022    2:02 PM 02/11/2022    9:51 AM 12/16/2021    9:04 AM  Advanced Directives  Does Patient Have a Medical Advance Directive? No Yes Yes Yes No Yes Yes  Type of Special educational needs teacher of [place];Living will Healthcare Power of [employer] of [place];Living will  Healthcare Power of [employer] Power of Attorney  Does patient want to make changes to medical advance directive?  No - Patient declined No - Patient declined      Copy of Healthcare Power of Attorney in Chart?  Yes - validated most recent copy scanned in chart (See row information) No - copy requested    No - copy requested  Would patient like information on creating a medical advance directive? No - Patient declined    No - Patient declined No - Patient declined     Current Medications (verified) Outpatient Encounter Medications as of 12/23/2022  Medication Sig   ACCU-CHEK AVIVA PLUS test strip TEST THREE TIMES DAILY   Accu-Chek Softclix Lancets lancets TEST BLOOD SUGAR THREE TIMES DAILY   albuterol (PROVENTIL HFA;VENTOLIN HFA) 108 (90 BASE) MCG/ACT inhaler Inhale 2 puffs into the lungs every 6 (six) hours as needed for wheezing or shortness of breath.    albuterol (PROVENTIL) (2.5 MG/3ML) 0.083% nebulizer solution Take 3 mLs (2.5 mg total) by nebulization every 4 (four) hours as needed for wheezing or shortness of breath.   Alcohol Swabs (DROPSAFE ALCOHOL PREP) 70 % PADS USE TWICE DAILY  WITH  SUGAR  CHECKS   apixaban (ELIQUIS) 5  MG TABS tablet Take 1 tablet (5 mg total) by mouth 2 (two) times daily.   atorvastatin (LIPITOR) 40 MG tablet Take 1 tablet (40 mg total) by mouth daily.   Blood Glucose Monitoring Suppl (TRUE METRIX METER) w/Device KIT Use to check blood sugar 4 times a day   busPIRone (BUSPAR) 5 MG tablet Take 1 tablet (5 mg total) by mouth 2 (two) times daily.   cholecalciferol (VITAMIN D3) 25 MCG (1000 UNIT) tablet Take 1,000 Units by mouth daily.   Continuous Glucose Receiver (FREESTYLE LIBRE 2 READER) DEVI To check blood sugars 3-4 times daily due to insulin use. DX E11.40   Continuous Glucose Sensor (FREESTYLE LIBRE 2 SENSOR) MISC To check blood sugars 3-4 times daily due to insulin use. DX E11.40   Cyanocobalamin 1000 MCG/ML KIT Inject 1,000 mcg as directed every 30 (thirty) days.   cyclobenzaprine (FLEXERIL) 5 MG tablet Take 5 mg by mouth 2 (two) times daily as needed for muscle spasms. Takes very rarely   Dulaglutide (TRULICITY) 4.5 MG/0.5ML SOPN Inject 4.5 mg as directed once a week.   Fluticasone-Umeclidin-Vilant (TRELEGY ELLIPTA) 100-62.5-25 MCG/ACT AEPB Inhale 1 puff into the lungs daily.   folic acid (FOLVITE) 1 MG tablet TAKE 1 TABLET EVERY DAY   furosemide (LASIX) 40 MG tablet Take 0.5 tablets (20 mg total) by mouth daily.   gabapentin (  NEURONTIN) 800 MG tablet Take 1 tablet (800 mg total) by mouth 3 (three) times daily.   insulin degludec (TRESIBA FLEXTOUCH) 100 UNIT/ML FlexTouch Pen Inject 55 Units into the skin at bedtime.   insulin lispro (HUMALOG KWIKPEN) 100 UNIT/ML KwikPen Inject 5 Units into the skin 3 (three) times daily before meals. Do not inject insulin if you are not going to eat meal.  Only take before a meal.   losartan (COZAAR) 25 MG tablet Take 0.5 tablets (12.5 mg total) by mouth daily.   metFORMIN (GLUCOPHAGE) 1000 MG tablet TAKE 1 TABLET TWICE DAILY WITH MEALS   metoprolol succinate (TOPROL-XL) 25 MG 24 hr tablet Take 0.5 tablets (12.5 mg total) by mouth daily.   montelukast  (SINGULAIR) 10 MG tablet Take 1 tablet (10 mg total) by mouth at bedtime.   Nebulizers (EASY AIR COMPRESSOR NEBULIZER) MISC Use to inhaler nebulizer treatments at needed per instructions on nebulizer prescription   ondansetron (ZOFRAN) 4 MG tablet Take 1 tablet (4 mg total) by mouth every 8 (eight) hours as needed for nausea or vomiting.   PAIN MANAGEMENT INTRATHECAL, IT, PUMP 1 each by Intrathecal route. Intrathecal (IT) medication:  Morphine Patient does not remember current. Adjusted every 2.5 months at [facility] in [place].   pantoprazole (PROTONIX) 40 MG tablet Take 1 tablet (40 mg total) by mouth daily.   venlafaxine XR (EFFEXOR-XR) 150 MG 24 hr capsule Take 1 capsule (150 mg total) by mouth daily.   No facility-administered encounter medications on file as of 12/23/2022.    Allergies (verified) Other, Pain patch [menthol], Avelox [moxifloxacin hcl in nacl], Doxycycline, Erythromycin, Fentanyl, Moxifloxacin hcl, Oxycontin [oxycodone], and Ozempic [semaglutide]   History: Past Medical History:  Diagnosis Date   Arthritis    Asthma    Atrial fibrillation (HCC)    Breast cancer (HCC) 1998   right breast ca with mastectomy and chemotherapy and radiation   Bronchitis    CHF (congestive heart failure) (HCC)    "with Morphine withdrawal"   COPD (chronic obstructive pulmonary disease) (HCC)    Diabetes mellitus without complication (HCC)    Diverticulitis    diverticulosis also   Dyspnea    Endometriosis    GERD (gastroesophageal reflux disease)    History of shingles 2000-2005   Hypercholesteremia    Hypertension    IBS (irritable bowel syndrome)    Low back pain    a. Implanted morphine/bupivicaine/clonidine pump.   Neuropathy    Orthopnea    Oxygen dependent    uses at night   Personal history of chemotherapy    Personal history of radiation therapy    Pneumonia    pneumonia 5-6 times, history of bronchitis also   Scoliosis    Sleep apnea    does  not use cpap   Stroke (HCC) 2010   TIA, 10 years ago   Withdrawal from sedative drug (HCC)    withdrawal from morphine when pump batteries died   Past Surgical History:  Procedure Laterality Date   ABDOMINAL HYSTERECTOMY  1987   BACK SURGERY     Tailbone removed following fracture   BREAST SURGERY Right    mastectomy   CARDIAC CATHETERIZATION     CATARACT EXTRACTION W/PHACO Right 05/19/2019   Procedure: CATARACT EXTRACTION PHACO AND INTRAOCULAR LENS PLACEMENT (IOC), RIGHT, DIABETIC;  Surgeon: Elliot Cousin, MD;  Location: [facility] ORS;  Service: Ophthalmology;  Laterality: Right;  Lot # [id number] H [place]: 00:43.8 CDE: 4.59   CATARACT EXTRACTION W/PHACO Left 06/16/2019  Procedure: CATARACT EXTRACTION PHACO AND INTRAOCULAR LENS PLACEMENT (IOC) LEFT VISION BLUE DIABETIC;  Surgeon: Elliot Cousin, MD;  Location: [facility] ORS;  Service: Ophthalmology;  Laterality: Left;  Lot #[id number] H [place]: 00:46.9 CDE: 6.53   COCCYX REMOVAL     COLONOSCOPY WITH PROPOFOL N/A 01/01/2017   Procedure: COLONOSCOPY WITH PROPOFOL;  Surgeon: Wyline Mood, MD;  Location: [facility] ENDOSCOPY;  Service: Endoscopy;  Laterality: N/A;   COLONOSCOPY WITH PROPOFOL N/A 12/11/2021   Procedure: COLONOSCOPY WITH PROPOFOL;  Surgeon: Toney Reil, MD;  Location: [facility] ENDOSCOPY;  Service: Gastroenterology;  Laterality: N/A;   ELBOW ARTHROSCOPY WITH TENDON RECONSTRUCTION     ESOPHAGOGASTRODUODENOSCOPY (EGD) WITH PROPOFOL N/A 01/01/2017   Procedure: ESOPHAGOGASTRODUODENOSCOPY (EGD) WITH PROPOFOL;  Surgeon: Wyline Mood, MD;  Location[facility] ENDOSCOPY;  Service: Endoscopy;  Laterality: N/A;   ESOPHAGOGASTRODUODENOSCOPY (EGD) WITH PROPOFOL N/A 12/11/2021   Procedure: ESOPHAGOGASTRODUODENOSCOPY (EGD) WITH PROPOFOL;  Surgeon: Toney Reil, MD;  Location: [facility] ENDOSCOPY;  Service: Gastroenterology;  Laterality: N/A;   INTRATHECAL PUMP IMPLANT     LEFT HEART CATH AND CORONARY ANGIOGRAPHY N/A 04/28/2017   Procedure: LEFT HEART CATH AND CORONARY  ANGIOGRAPHY;  Surgeon: Yvonne Kendall, MD;  Location: [facility] INVASIVE CV LAB;  Service: Cardiovascular;  Laterality: N/A;   MASTECTOMY Right 06/1997   morphine pump  2011   PLANTAR FASCIA RELEASE     TRIGGER FINGER RELEASE     Family History  Problem Relation Age of Onset   Cancer Mother 75       lung   Thyroid disease Mother    Cancer Father 69       Pancreatic   Hypertension Sister    Cancer Sister        "cancer on face"   Hyperlipidemia Sister    Osteoporosis Maternal Grandmother    Cancer Paternal Grandmother        colon   Arthritis Paternal Grandfather    Hyperlipidemia Son    Seizures Son    Breast cancer Neg Hx    Social History   Socioeconomic History   Marital status: Unknown    Spouse name: Not on file   Number of children: Not on file   Years of education: Not on file   Highest education level: High school graduate  Occupational History   Occupation: retired  Tobacco Use   Smoking status: Former    Packs/day: 1.00    Years: 30.00    Additional pack years: 0.00    Total pack years: 30.00    Types: Cigarettes    Quit date: 04/30/2004    Years since quitting: 18.6   Smokeless tobacco: Never  Vaping Use   Vaping Use: Never used  Substance and Sexual Activity   Alcohol use: No   Drug use: No   Sexual activity: Not Currently    Birth control/protection: Post-menopausal  Other Topics Concern   Not on file  Social History Narrative   ** Merged History Encounter **       Social Determinants of Health   Financial Resource Strain: Low Risk  (12/23/2022)   Overall Financial Resource Strain (CARDIA)    Difficulty of Paying Living Expenses: Not hard at all  Food Insecurity: No Food Insecurity (12/23/2022)   Hunger Vital Sign    Worried About Running Out of Food in the Last Year: Never true    Ran Out of Food in the Last Year: Never true  Transportation Needs: No Transportation Needs (12/23/2022)   PRAPARE - Transportation    Lack  of Transportation  (Medical): No    Lack of Transportation (Non-Medical): No  Physical Activity: Inactive (12/23/2022)   Exercise Vital Sign    Days of Exercise per Week: 0 days    Minutes of Exercise per Session: 0 min  Stress: No Stress Concern Present (12/23/2022)   [employer] of Occupational Health - Occupational Stress Questionnaire    Feeling of Stress : Only a little  Social Connections: Moderately Isolated (12/23/2022)   Social Connection and Isolation Panel [NHANES]    Frequency of Communication with Friends and Family: More than three times a week    Frequency of Social Gatherings with Friends and Family: More than three times a week    Attends Religious Services: More than 4 times per year    Active Member of [employer] or Organizations: No    Attends Engineer, structural: Never    Marital Status: Divorced    Tobacco Counseling Counseling given: Not Answered   Clinical Intake:  Pre-visit preparation completed: Yes  Pain : 0-10 Pain Score: 7  Pain Type: Chronic pain Pain Location: Hip Pain Orientation: Left     Diabetes: Yes CBG done?: No Did pt. bring in CBG monitor from home?: No  How often do you need to have someone help you when you read instructions, pamphlets, or other written materials from your doctor or pharmacy?: 1 - Never  Diabetic?yes Nutrition Risk Assessment:  Has the patient had any N/V/D within the last 2 months?  Yes  Does the patient have any non-healing wounds?  No  Has the patient had any unintentional weight loss or weight gain?  No   Diabetes:  Is the patient diabetic?  Yes  If diabetic, was a CBG obtained today?  No  Did the patient bring in their glucometer from home?  No  How often do you monitor your CBG's? 3-4x/day.   Financial Strains and Diabetes Management:  Are you having any financial strains with the device, your supplies or your medication? No .  Does the patient want to be seen by Chronic Care Management for management of  their diabetes?  No  Would the patient like to be referred to a Nutritionist or for Diabetic Management?  No   Diabetic Exams:  Diabetic Eye Exam: Completed 03/21/22.  Pt has been advised about the importance in completing this exam.  Diabetic Foot Exam: Completed 12/12/21. Pt has been advised about the importance in completing this exam.    Interpreter Needed?: No  Information entered by :: Kennedy Bucker, LPN   Activities of Daily Living    12/23/2022    9:56 AM 11/26/2022    2:00 AM  In your present state of health, do you have any difficulty performing the following activities:  Hearing? 0 0  Vision? 0 0  Difficulty concentrating or making decisions? 0 0  Walking or climbing stairs? 1 0  Comment hip   Dressing or bathing? 0 0  Doing errands, shopping? 0 1  Preparing Food and eating ? N   Using the Toilet? N   In the past six months, have you accidently leaked urine? N   Do you have problems with loss of bowel control? N   Managing your Medications? N   Managing your Finances? N   Housekeeping or managing your Housekeeping? N     Patient Care Team: Marjie Skiff, NP as PCP - General (Nurse Practitioner) End, Cristal Deer, MD as PCP - Cardiology (Cardiology) Lanier Prude, MD as PCP -  Electrophysiology (Cardiology) Mertie Moores, MD as Referring Physician (Pulmonary Disease) Cannon Kettle, MD as Referring Physician (Pain Medicine) Bridgett Larsson, LCSW as Social Worker (Licensed Clinical Social Worker) Creig Hines, MD as Consulting Physician (Oncology) Zettie Pho, [facility] (Pharmacist) Kemper Durie, RN as [facility] Care Management  Indicate any recent Medical Services you may have received from other than [facility] providers in the past year (date may be approximate).     Assessment:   This is a routine wellness examination for Anjani.  Hearing/Vision screen Hearing Screening - Comments:: No aids Vision Screening - Comments::  Readers- [facility]  Dietary issues and exercise activities discussed: Current Exercise Habits: The patient does not participate in regular exercise at present, Exercise limited by: orthopedic condition(s)   Goals Addressed             This Visit's Progress    DIET - EAT MORE FRUITS AND VEGETABLES         Depression Screen    12/23/2022    9:52 AM 12/23/2022    9:25 AM 12/11/2022   11:41 AM 09/24/2022    8:45 AM 07/24/2022   10:24 AM 06/24/2022    8:21 AM 05/20/2022   10:40 AM  PHQ 2/9 Scores  PHQ - 2 Score 2 2 1 1 4 3 4   PHQ- 9 Score 3 6 6 7 12 10 11     Fall Risk    12/23/2022    9:56 AM 12/11/2022   11:41 AM 09/24/2022    8:44 AM 06/24/2022    8:21 AM 05/20/2022   10:40 AM  Fall Risk   Falls in the past year? 0 0 0 0 0  Number falls in past yr: 0 0 0 0 0  Injury with Fall? 0 0 0 0 0  Risk for fall due to : No Fall Risks Impaired mobility No Fall Risks History of fall(s) No Fall Risks  Follow up Falls prevention discussed;Falls evaluation completed Falls evaluation completed Falls evaluation completed Falls evaluation completed Falls evaluation completed    FALL RISK PREVENTION PERTAINING TO THE HOME:  Any stairs in or around the home? Yes  If so, are there any without handrails? No  Home free of loose throw rugs in walkways, pet beds, electrical cords, etc? Yes  Adequate lighting in your home to reduce risk of falls? Yes   ASSISTIVE DEVICES UTILIZED TO PREVENT FALLS:  Life alert? No  Use of a cane, walker or w/c? Yes -cane Grab bars in the bathroom? Yes  Shower chair or bench in shower? No  Elevated toilet seat or a handicapped toilet? No   TIMED UP AND GO:  Was the test performed? Yes .  Length of time to ambulate 10 feet: 5 sec.   Gait slow and steady with assistive device  Cognitive Function:        12/23/2022   10:03 AM 12/16/2021    9:05 AM 12/14/2020    9:26 AM 12/06/2018    9:39 AM 12/04/2017    8:20 AM  6CIT Screen  What Year? 0 points 0  points 0 points 0 points 0 points  What month? 0 points 0 points 0 points 0 points 0 points  What time? 0 points 0 points 0 points 0 points 0 points  Count back from 20 0 points 0 points 0 points 0 points 0 points  Months in reverse 0 points 0 points 0 points 0 points 0 points  Repeat phrase 0 points  0 points 0 points 0 points 0 points  Total Score 0 points 0 points 0 points 0 points 0 points    Immunizations Immunization History  Administered Date(s) Administered   Fluad Quad(high Dose 65+) 04/19/2019, 07/26/2020, 04/29/2021, 05/20/2022   Influenza, High Dose Seasonal PF 04/28/2016, 04/13/2017, 04/14/2018   Influenza,inj,Quad PF,6+ Mos 06/18/2015   Influenza-Unspecified 04/04/2014   PFIZER(Purple Top)SARS-COV-2 Vaccination 10/20/2019, 11/10/2019, 05/31/2020   Pneumococcal Conjugate-13 06/18/2015   Pneumococcal Polysaccharide-23 08/06/2005, 08/05/2016   Td 08/28/2007, 01/17/2019   Zoster Recombinat (Shingrix) 04/29/2021, 07/23/2021    TDAP status: Up to date  Flu Vaccine status: Up to date  Pneumococcal vaccine status: Up to date  Covid-19 vaccine status: Completed vaccines  Qualifies for Shingles Vaccine? Yes   Zostavax completed No   Shingrix Completed?: Yes  Screening Tests Health Maintenance  Topic Date Due   COVID-19 Vaccine (4 - 2023-24 season) 04/04/2022   COLONOSCOPY (Pts 45-28yrs Insurance coverage will need to be confirmed)  12/12/2022   FOOT EXAM  12/13/2022   INFLUENZA VACCINE  03/05/2023   OPHTHALMOLOGY EXAM  03/22/2023   HEMOGLOBIN A1C  06/13/2023   Diabetic kidney evaluation - Urine ACR  09/25/2023   Diabetic kidney evaluation - eGFR measurement  12/11/2023   Medicare Annual Wellness (AWV)  12/23/2023   MAMMOGRAM  04/04/2024   DEXA SCAN  02/24/2026   DTaP/Tdap/Td (3 - Tdap) 01/16/2029   Pneumonia Vaccine 42+ Years old  Completed   Hepatitis C Screening  Completed   Zoster Vaccines- Shingrix  Completed   HPV VACCINES  Aged Out    Health  Maintenance  Health Maintenance Due  Topic Date Due   COVID-19 Vaccine (4 - 2023-24 season) 04/04/2022   COLONOSCOPY (Pts 45-10yrs Insurance coverage will need to be confirmed)  12/12/2022   FOOT EXAM  12/13/2022    Colorectal cancer screening: Type of screening: Colonoscopy. Completed 12/11/21. Repeat every 1 years- will have to wait until has hip surgery  Mammogram status: Completed 04/04/22. Repeat every year  Bone Density status: Completed 02/25/16. Results reflect: Bone density results: NORMAL. Repeat every 5 years.  Lung Cancer Screening: (Low Dose CT Chest recommended if Age 22-80 years, 30 pack-year currently smoking OR have quit w/in 15years.) does not qualify.    Additional Screening:  Hepatitis C Screening: does qualify; Completed 06/18/15  Vision Screening: Recommended annual ophthalmology exams for early detection of glaucoma and other disorders of the eye. Is the patient up to date with their annual eye exam?  Yes  Who is the provider or what is the name of the office in which the patient attends annual eye exams? [facility] If pt is not established with a provider, would they like to be referred to a provider to establish care? No .   Dental Screening: Recommended annual dental exams for proper oral hygiene  Community Resource Referral / Chronic Care Management: CRR required this visit?  No   CCM required this visit?  No      Plan:     I have personally reviewed and noted the following in the patient's chart:   Medical and social history Use of alcohol, tobacco or illicit drugs  Current medications and supplements including opioid prescriptions. Patient is not currently taking opioid prescriptions. Functional ability and status Nutritional status Physical activity Advanced directives List of other physicians Hospitalizations, surgeries, and ER visits in previous 12 months Vitals Screenings to include cognitive, depression, and falls Referrals and  appointments  In addition, I have reviewed and discussed  with patient certain preventive protocols, quality metrics, and best practice recommendations. A written personalized care plan for preventive services as well as general preventive health recommendations were provided to patient.     Hal Hope, LPN   [id number]/61/0960   Nurse Notes: none

## 2022-12-23 NOTE — Assessment & Plan Note (Signed)
Chronic, ongoing, insulin dependent. A1c 9.2% on 12/11/22, trend up after recent steroids and sepsis.  Discussed at length with her today.  Had alerted ortho to result.  She is having trend down in blood sugar with addition of meal time insulin at this time. - Will continue Metformin at max dosing, Trulicity 4.5 MG weekly, and Tresiba 55 units, increase 3 units in 3 days if fasting sugar not less then 130 consistently recommended.  Continue meal time insulin, 7 units prior to meals and will increase as needed.  Educated her on this.  Reviewed CCM team pharmacist note for further recommendations.   - Freestyle placed in office today with PCP assistance and instructed on how to use, to contact PCP if any issues with this. - Recommend she continue to monitor BS consistently at home and document + focus heavily on diet changes.  She is aware to notify provider if fasting BS >130 consistently or <70, as may need to adjust insulin further.   - In past poorly tolerated Jardiance and Mounjaro -- discussed with her that and how SGLT2 would benefit HF -- however concern for side effects.   - Return in August for A1c check.

## 2022-12-23 NOTE — Patient Instructions (Signed)
[name] , Thank you for taking time to come for your Medicare Wellness Visit. I appreciate your ongoing commitment to your health goals. Please review the following plan we discussed and let me know if I can assist you in the future.   These are the goals we discussed:  Goals       "I want to get my blood sugars under control" (pt-stated)      CARE PLAN ENTRY (see longtitudinal plan of care for additional care plan information)  Current Barriers:  Diabetes: uncontrolled, most recent A1c 7.3% (03/28/20) Patient reports she is feeling much improved and cough is tolerable. She is frustrated with wearing oxygen 24/7 and looking forward to "getting rid of these blood clots"  Completed course of prednisone. Reports her glucose readings are now back to normal. She is slightly disappointed at her recent A1C reading of 7.3% (up from 7.0% in April) and reports she tries very hard to keep her blood sugar controlled. Denies any episodes of hypoglycemia and states she becomes symptomatic with weakness, nausea, sweating and dizziness when her BG reaches 105-110. Reports a  history of passing out with a BG level of 50.  [employer] representative called her yesterday stating they will be sending a faxed request for new glucometer and supplies to this office for approval from PCP. Her [employer] meter and supplies will no longer be available. I checked the chart but do not see documentation that we have received request. Current antihyperglycemic regimen: Trulicity 3.0 mg, metformin 1000 mg BID, Tresiba 20-25 units daily (adjusts based on BG to avoid hypoglycemia) Current glucose readings:  Reports sugars have improved now that she is off prednisone. Fasting: 120-140s 2 hour after meals: ~150-180s, sometimes 230-250s Cardiovascular risk reduction: Current hypertensive regimen: Metoprolol succinate 12.5mg  daily home BP readings have been "good" she remembers one reading from Sunday 132/68. She checks BP 3-4 times  weekly. Current hyperlipidemia regimen: atorvastatin 20 mg daily, LDL at goal <70;  Current antiplatelet regimen: none right now d/t Eliquis COPD; Trelegy QAM, albuterol PRN (has not needed a dose in 1-2 months); O2  continuous Patient reports Trelegy as very effective.  She is recovering from recent bronchitis and endorsed baseline lack of energy abd some diarrhea with Augmentin prescribed by Pulmonogy on 04/13/20. She is halfway through her 10 day course.  Hx PE: Eliquis 5 mg BID per Dr. Meredeth Ide. Will re-evaluate after 6 months tx.(Next appt 07/04/20) Denies any s/sx bleeding/brusing Chronic pain: follows w/ pain management; gabapentin 800 mg TID, cyclobenzaprine 5 mg BID PRN. Intrathecal morphine pump. Mood: venlafaxine XR 150 mg QAM, reports feeling well controlled. Became tearful when recounting sitting with her father at the end of his life 38 years ago. She recalls only sleeping about 4 hours during those two weeks and believes this to be the root of her continued insomnia. States she has previously talked with counselors about this experience. She reports she retried melatonin for ~ 2/5 weeks and stopped due to ineffectiveness. She is no longer taking quetiapine as it did not work for her. She reports sleeping 2-3 hours most nights and she considers this adequate for her.   Pharmacist Clinical Goal(s):  Over the next 90 days, patient with work with PharmD and primary care provider to address optimized diabetes management  Interventions: Comprehensive medication review performed, medication list updated in electronic medical record Inter-disciplinary care team collaboration (see longitudinal plan of care) Provided empathetic listening. Recommended she continue working with LCSW for mental health support including  relaxation and sleep hygiene   Discussed diet and physical activity. Praised patient for cutting back from > 1 gallon to one glass of sweet tea daily. She tells me she has started  adding "a little" unsweetened tea to her daily glass. She eats lunch at [employer] every day which usually consists of veggies and a lean protein.  Reassured patient. Discussed most recent A1c reflects 3 month period during which she had many stressors.  Discussed Evaristo Bury having greater effect on fasting BG while Trulicity effects both fasting and postprandial. Encouraged patient to increase fluid intake and consider taking a probiotic or increasing intake of fermented foods including yogurt, sauerkraut or kefir to help with antibiotic associated diarrhea.  Patient Self Care Activities:  Patient will check blood glucose BID, document, and provide at future appointments Patient will report any questions or concerns to provider  Patient will continue monitoring blood pressure 3-4 times weekly, document and provide at future appointments.  Please see past updates related to this goal by clicking on the "Past Updates" button in the selected goal        DIET - EAT MORE FRUITS AND VEGETABLES      DIET - INCREASE WATER INTAKE      Recommend drinking at least 6-8 glasses of water a day       Patient Stated      12/14/2020, no goals      Patient Stated      Would like to spread the word more about the lord      Recover from Pneumonia and manage COPD      Care Coordination Interventions: Provided patient with basic written and verbal COPD education on self care/management/and exacerbation prevention Advised patient to track and manage COPD triggers Provided written and verbal instructions on pursed lip breathing and utilized returned demonstration as teach back Provided instruction about proper use of medications used for management of COPD including inhalers Advised patient to self assesses COPD action plan zone and make appointment with provider if in the yellow zone for 48 hours without improvement Screening for signs and symptoms of depression related to chronic disease state  Assessed  social determinant of health barriers       SW-Track and Manage My Symptoms-Depression      Timeframe:  Long-Range Goal Priority:  Medium Start Date: 10/01/20                        Expected End Date:  09/03/21                    Follow Up Date- 08/15/21   Patient Self Care Activities:  Attend all scheduled provider appointments Utilize strategies discussed to assist in the management of symptoms Contact office with any questions or concerns        This is a list of the screening recommended for you and due dates:  Health Maintenance  Topic Date Due   COVID-19 Vaccine (4 - 2023-24 season) 04/04/2022   Colon Cancer Screening  12/12/2022   Complete foot exam   12/13/2022   Flu Shot  03/05/2023   Eye exam for diabetics  03/22/2023   Hemoglobin A1C  06/13/2023   Yearly kidney health urinalysis for diabetes  09/25/2023   Yearly kidney function blood test for diabetes  12/11/2023   Medicare Annual Wellness Visit  12/23/2023   Mammogram  04/04/2024   DEXA scan (bone density measurement)  02/24/2026   DTaP/Tdap/Td vaccine (  3 - Tdap) 01/16/2029   Pneumonia Vaccine  Completed   Hepatitis C Screening: USPSTF Recommendation to screen - Ages 79-79 yo.  Completed   Zoster (Shingles) Vaccine  Completed   HPV Vaccine  Aged Out    Advanced directives: no  Conditions/risks identified: none  Next appointment: Follow up in one year for your annual wellness visit 12/29/23 @ 1:00 pm in person   Preventive Care 65 Years and Older, Female Preventive care refers to lifestyle choices and visits with your health care provider that can promote health and wellness. What does preventive care include? A yearly physical exam. This is also called an annual well check. Dental exams once or twice a year. Routine eye exams. Ask your health care provider how often you should have your eyes checked. Personal lifestyle choices, including: Daily care of your teeth and gums. Regular physical  activity. Eating a healthy diet. Avoiding tobacco and drug use. Limiting alcohol use. Practicing safe sex. Taking low-dose aspirin every day. Taking vitamin and mineral supplements as recommended by your health care provider. What happens during an annual well check? The services and screenings done by your health care provider during your annual well check will depend on your age, overall health, lifestyle risk factors, and family history of disease. Counseling  Your health care provider may ask you questions about your: Alcohol use. Tobacco use. Drug use. Emotional well-being. Home and relationship well-being. Sexual activity. Eating habits. History of falls. Memory and ability to understand (cognition). Work and work Astronomer. Reproductive health. Screening  You may have the following tests or measurements: Height, weight, and BMI. Blood pressure. Lipid and cholesterol levels. These may be checked every 5 years, or more frequently if you are over 61 years old. Skin check. Lung cancer screening. You may have this screening every year starting at age 44 if you have a 30-pack-year history of smoking and currently smoke or have quit within the past 15 years. Fecal occult blood test (FOBT) of the stool. You may have this test every year starting at age 33. Flexible sigmoidoscopy or colonoscopy. You may have a sigmoidoscopy every 5 years or a colonoscopy every 10 years starting at age 59. Hepatitis C blood test. Hepatitis B blood test. Sexually transmitted disease (STD) testing. Diabetes screening. This is done by checking your blood sugar (glucose) after you have not eaten for a while (fasting). You may have this done every 1-3 years. Bone density scan. This is done to screen for osteoporosis. You may have this done starting at age 78. Mammogram. This may be done every 1-2 years. Talk to your health care provider about how often you should have regular mammograms. Talk with your  health care provider about your test results, treatment options, and if necessary, the need for more tests. Vaccines  Your health care provider may recommend certain vaccines, such as: Influenza vaccine. This is recommended every year. Tetanus, diphtheria, and acellular pertussis (Tdap, Td) vaccine. You may need a Td booster every 10 years. Zoster vaccine. You may need this after age 58. Pneumococcal 13-valent conjugate (PCV13) vaccine. One dose is recommended after age 67. Pneumococcal polysaccharide (PPSV23) vaccine. One dose is recommended after age 47. Talk to your health care provider about which screenings and vaccines you need and how often you need them. This information is not intended to replace advice given to you by your health care provider. Make sure you discuss any questions you have with your health care provider. Document Released: 08/17/2015 Document Revised:  04/09/2016 Document Reviewed: 05/22/2015 Elsevier Interactive Patient Education  2017 [employer].  Fall Prevention in the Home Falls can cause injuries. They can happen to people of all ages. There are many things you can do to make your home safe and to help prevent falls. What can I do on the outside of my home? Regularly fix the edges of walkways and driveways and fix any cracks. Remove anything that might make you trip as you walk through a door, such as a raised step or threshold. Trim any bushes or trees on the path to your home. Use bright outdoor lighting. Clear any walking paths of anything that might make someone trip, such as rocks or tools. Regularly check to see if handrails are loose or broken. Make sure that both sides of any steps have handrails. Any raised decks and porches should have guardrails on the edges. Have any leaves, snow, or ice cleared regularly. Use sand or salt on walking paths during winter. Clean up any spills in your garage right away. This includes oil or grease spills. What can I  do in the bathroom? Use night lights. Install grab bars by the toilet and in the tub and shower. Do not use towel bars as grab bars. Use non-skid mats or decals in the tub or shower. If you need to sit down in the shower, use a plastic, non-slip stool. Keep the floor dry. Clean up any water that spills on the floor as soon as it happens. Remove soap buildup in the tub or shower regularly. Attach bath mats securely with double-sided non-slip rug tape. Do not have throw rugs and other things on the floor that can make you trip. What can I do in the bedroom? Use night lights. Make sure that you have a light by your bed that is easy to reach. Do not use any sheets or blankets that are too big for your bed. They should not hang down onto the floor. Have a firm chair that has side arms. You can use this for support while you get dressed. Do not have throw rugs and other things on the floor that can make you trip. What can I do in the kitchen? Clean up any spills right away. Avoid walking on wet floors. Keep items that you use a lot in easy-to-reach places. If you need to reach something above you, use a strong step stool that has a grab bar. Keep electrical cords out of the way. Do not use floor polish or wax that makes floors slippery. If you must use wax, use non-skid floor wax. Do not have throw rugs and other things on the floor that can make you trip. What can I do with my stairs? Do not leave any items on the stairs. Make sure that there are handrails on both sides of the stairs and use them. Fix handrails that are broken or loose. Make sure that handrails are as long as the stairways. Check any carpeting to make sure that it is firmly attached to the stairs. Fix any carpet that is loose or worn. Avoid having throw rugs at the top or bottom of the stairs. If you do have throw rugs, attach them to the floor with carpet tape. Make sure that you have a light switch at the top of the stairs  and the bottom of the stairs. If you do not have them, ask someone to add them for you. What else can I do to help prevent falls? Wear shoes  that: Do not have high heels. Have rubber bottoms. Are comfortable and fit you well. Are closed at the toe. Do not wear sandals. If you use a stepladder: Make sure that it is fully opened. Do not climb a closed stepladder. Make sure that both sides of the stepladder are locked into place. Ask someone to hold it for you, if possible. Clearly mark and make sure that you can see: Any grab bars or handrails. First and last steps. Where the edge of each step is. Use tools that help you move around (mobility aids) if they are needed. These include: Canes. Walkers. Scooters. Crutches. Turn on the lights when you go into a dark area. Replace any light bulbs as soon as they burn out. Set up your furniture so you have a clear path. Avoid moving your furniture around. If any of your floors are uneven, fix them. If there are any pets around you, be aware of where they are. Review your medicines with your doctor. Some medicines can make you feel dizzy. This can increase your chance of falling. Ask your doctor what other things that you can do to help prevent falls. This information is not intended to replace advice given to you by your health care provider. Make sure you discuss any questions you have with your health care provider. Document Released: 05/17/2009 Document Revised: 12/27/2015 Document Reviewed: 08/25/2014 Elsevier Interactive Patient Education  2017 [employer].

## 2022-12-23 NOTE — Assessment & Plan Note (Signed)
Chronic, ongoing.  Denies SI/HI.  Mood well-controlled.  Continue current medication regimen and adjust as needed.   

## 2022-12-23 NOTE — Progress Notes (Signed)
BP 99/62   Pulse 85   Temp 98.1 F (36.7 C) (Oral)   Ht 4' 10.5" (1.486 m)   Wt 149 lb 9.6 oz (67.9 kg)   LMP  (LMP Unknown)   SpO2 95%   BMI 30.73 kg/m    Subjective:    [name], female    DOB: 1951/05/20, 72 y.o.   MRN: [id number]  HPI: Jordan Chan is a 72 y.o. female  Chief Complaint  Patient presents with   Diabetes   Mood   DIABETES A1c 9.2% in May. Continues on Metformin 1000 MG BID, Trulicity 4.5 MG weekly, Tresiba 55 units at HS, plus we started meal time insulin on 12/11/22 to assist with A1c as has upcoming hip surgery on 01/08/23. Currently taking 7 units insulin before meals.  Recent sepsis and PNA - overall she reports improvement in symptoms.  Jardiance made her sick in past and Mounjaro gave her severe diarrhea and abdominal pain.  Works with [employer] team and provider.    Continues on monthly B12 injections monthly for low levels.  Saw hematology in past, last visit 02/11/22, received 5 iron infusions total -- continues on folic acid.  WBC remains stable.  If becomes elevated (>20) and continues to elevate they will perform bone marrow biopsy.  Hypoglycemic episodes: none Polydipsia/polyuria: no Visual disturbance: no Chest pain: no Paresthesias: no Glucose Monitoring: yes             Accucheck frequency: BID             Fasting glucose: this morning 121, yesterday 140 something             Post prandial:              Evening: 180 -- trending down             Before meals: Taking Insulin?: yes             Long acting insulin: Tresiba 55 units             Short acting insulin: Blood Pressure Monitoring: occasional Retinal Examination: Up to Date -- [facility] Foot Exam: Up to Date Pneumovax: Up to Date Influenza: Up to Date Aspirin: yes   DEPRESSION Continues on Effexor 150 MG daily + Buspar 5 MG BID.  Has home to live in now[place].  Mood improving.  Mood status: stable Satisfied with current  treatment?: yes Symptom severity: moderate  Duration of current treatment : chronic Side effects: no Medication compliance: good compliance Psychotherapy/counseling: in the past Depressed mood:  occasional with pain Anxious mood:  occasional with pain Anhedonia: no Significant weight loss or gain: no Insomnia: yes hard to fall asleep Fatigue: no Feelings of worthlessness or guilt: no Impaired concentration/indecisiveness: no Suicidal ideations: no Hopelessness: no Crying spells: no    12/23/2022    9:25 AM 12/11/2022   11:41 AM 09/24/2022    8:45 AM 07/24/2022   10:24 AM 06/24/2022    8:21 AM  Depression screen PHQ 2/9  Decreased Interest 0 0 0 2 0  Down, Depressed, Hopeless 2 1 1 2 3   PHQ - 2 Score 2 1 1 4 3   Altered sleeping 3 1 2 3 2   Tired, decreased energy 0 3 3 2 3   Change in appetite 0 1 1 2 1   Feeling bad or failure about yourself  1 0 0 0 0  Trouble concentrating  0  0 1 1  Moving slowly or fidgety/restless 0 0 0 0 0  Suicidal thoughts 0 0 0 0 0  PHQ-9 Score 6 6 7 12 10   Difficult doing work/chores Not difficult at all Not difficult at all Not difficult at all Somewhat difficult Not difficult at all       12/23/2022    9:25 AM 12/11/2022   11:41 AM 09/24/2022    8:45 AM 07/24/2022   10:24 AM  GAD 7 : Generalized Anxiety Score  Nervous, Anxious, on Edge 2 1 1 2   Control/stop worrying 1 1 0 1  Worry too much - different things 1 1 0 1  Trouble relaxing 1 1 1 2   Restless 0 0 0 0  Easily annoyed or irritable 1 1 0 1  Afraid - awful might happen 0 0 0 0  Total GAD 7 Score 6 5 2 7   Anxiety Difficulty Not difficult at all Not difficult at all  Not difficult at all      Relevant past medical, surgical, family and social history reviewed and updated as indicated. Interim medical history since our last visit reviewed. Allergies and medications reviewed and updated.  Review of Systems  Constitutional:  Negative for activity change, appetite change, diaphoresis,  fatigue and fever.  Respiratory:  Positive for shortness of breath (baseline). Negative for cough, chest tightness and wheezing.   Cardiovascular:  Negative for chest pain, palpitations and leg swelling.  Gastrointestinal: Negative.   Endocrine: Negative for heat intolerance, polydipsia, polyphagia and polyuria.  Musculoskeletal:  Positive for arthralgias (hip).  Neurological: Negative.   Psychiatric/Behavioral: Negative.      Per HPI unless specifically indicated above     Objective:    BP 99/62   Pulse 85   Temp 98.1 F (36.7 C) (Oral)   Ht 4' 10.5" (1.486 m)   Wt 149 lb 9.6 oz (67.9 kg)   LMP  (LMP Unknown)   SpO2 95%   BMI 30.73 kg/m   Wt Readings from Last 3 Encounters:  12/23/22 149 lb 9.6 oz (67.9 kg)  12/11/22 145 lb 8 oz (66 kg)  11/24/22 152 lb (68.9 kg)    Physical Exam Vitals and nursing note reviewed.  Constitutional:      General: She is awake. She is not in acute distress.    Appearance: She is well-developed and well-groomed. She is not ill-appearing.  HENT:     Head: Normocephalic.     Right Ear: Hearing normal.     Left Ear: Hearing normal.  Eyes:     General: Lids are normal.        Right eye: No discharge.        Left eye: No discharge.     Conjunctiva/sclera: Conjunctivae normal.     Pupils: Pupils are equal, round, and reactive to light.  Neck:     Thyroid: No thyromegaly.     Vascular: No carotid bruit.  Cardiovascular:     Rate and Rhythm: Normal rate and regular rhythm.     Heart sounds: Normal heart sounds. No murmur heard.    No gallop.  Pulmonary:     Effort: Pulmonary effort is normal. No accessory muscle usage or respiratory distress.     Breath sounds: Normal breath sounds. No decreased breath sounds, wheezing or rhonchi.  Abdominal:     General: Bowel sounds are normal.     Palpations: Abdomen is soft.  Musculoskeletal:     Cervical back: Normal range of motion and  neck supple.     Right lower leg: No edema.     Left lower  leg: No edema.  Lymphadenopathy:     Cervical: No cervical adenopathy.  Skin:    General: Skin is warm and dry.  Neurological:     Mental Status: She is alert and oriented to person, place, and time.  Psychiatric:        Attention and Perception: Attention normal.        Mood and Affect: Mood normal.        Speech: Speech normal.        Behavior: Behavior normal. Behavior is cooperative.        Thought Content: Thought content normal.     Results for orders placed or performed in visit on 12/11/22  Bayer DCA Hb A1c Waived  Result Value Ref Range   HB A1C (BAYER DCA - WAIVED) 9.2 (H) 4.8 - 5.6 %  CBC with Differential/Platelet  Result Value Ref Range   WBC 11.3 (H) 3.4 - 10.8 x10E3/uL   RBC 4.27 3.77 - 5.28 x10E6/uL   Hemoglobin 12.9 11.1 - 15.9 g/dL   Hematocrit 95.2 84.1 - 46.6 %   MCV 94 79 - 97 fL   MCH 30.2 26.6 - 33.0 pg   MCHC 32.2 31.5 - 35.7 g/dL   RDW 32.4 40.1 - 02.7 %   Platelets 483 (H) 150 - 450 x10E3/uL   Neutrophils 62 Not Estab. %   Lymphs 26 Not Estab. %   Monocytes 8 Not Estab. %   Eos 2 Not Estab. %   Basos 1 Not Estab. %   Neutrophils Absolute 7.1 (H) 1.4 - 7.0 x10E3/uL   Lymphocytes Absolute 3.0 0.7 - 3.1 x10E3/uL   Monocytes Absolute 0.9 0.1 - 0.9 x10E3/uL   EOS (ABSOLUTE) 0.3 0.0 - 0.4 x10E3/uL   Basophils Absolute 0.1 0.0 - 0.2 x10E3/uL   Immature Granulocytes 1 Not Estab. %   Immature Grans (Abs) 0.1 0.0 - 0.1 x10E3/uL  Comprehensive metabolic panel  Result Value Ref Range   Glucose 260 (H) 70 - 99 mg/dL   BUN 14 8 - 27 mg/dL   Creatinine, Ser 2.53 (H) 0.57 - 1.00 mg/dL   eGFR 51 (L) >66 YQ/IHK/7.42   BUN/Creatinine Ratio 12 12 - 28   Sodium 137 134 - 144 mmol/L   Potassium 4.7 3.5 - 5.2 mmol/L   Chloride 94 (L) 96 - 106 mmol/L   CO2 22 20 - 29 mmol/L   Calcium 9.9 8.7 - 10.3 mg/dL   Total Protein 7.6 6.0 - 8.5 g/dL   Albumin 4.3 3.8 - 4.8 g/dL   Globulin, Total 3.3 1.5 - 4.5 g/dL   Albumin/Globulin Ratio 1.3 1.2 - 2.2   Bilirubin  Total 0.3 0.0 - 1.2 mg/dL   Alkaline Phosphatase 80 44 - 121 IU/L   AST 14 0 - 40 IU/L   ALT 18 0 - 32 IU/L  Lipid Panel w/o Chol/HDL Ratio  Result Value Ref Range   Cholesterol, Total 169 100 - 199 mg/dL   Triglycerides 595 (H) 0 - 149 mg/dL   HDL 68 >63 mg/dL   VLDL Cholesterol Cal 26 5 - 40 mg/dL   LDL Chol Calc (NIH) 75 0 - 99 mg/dL      Assessment & Plan:   Problem List Items Addressed This Visit       Respiratory   Community acquired pneumonia of left lower lobe of lung    Acute and improved at  this time.  Has completed all treatment.  Will plan on repeat CXR at next visit in 4 weeks.        Relevant Orders   Basic metabolic panel   CBC with Differential/Platelet   DG Chest 2 View     Endocrine   Insulin dependent type 2 diabetes mellitus (HCC) - Primary    Chronic, ongoing, insulin dependent. A1c 9.2% on 12/11/22, trend up after recent steroids and sepsis.  Discussed at length with her today.  Had alerted ortho to result.  She is having trend down in blood sugar with addition of meal time insulin at this time. - Will continue Metformin at max dosing, Trulicity 4.5 MG weekly, and Tresiba 55 units, increase 3 units in 3 days if fasting sugar not less then 130 consistently recommended.  Continue meal time insulin, 7 units prior to meals and will increase as needed.  Educated her on this.  Reviewed CCM team pharmacist note for further recommendations.   - Freestyle placed in office today with PCP assistance and instructed on how to use, to contact PCP if any issues with this. - Recommend she continue to monitor BS consistently at home and document + focus heavily on diet changes.  She is aware to notify provider if fasting BS >130 consistently or <70, as may need to adjust insulin further.   - In past poorly tolerated Jardiance and Mounjaro -- discussed with her that and how SGLT2 would benefit HF -- however concern for side effects.   - Return in August for A1c check.          Other   Depression, major, single episode, in partial remission (HCC)    Chronic, ongoing.  Denies SI/HI.  Mood well-controlled.  Continue current medication regimen and adjust as needed.         Time: 25 minutes, >50% spent counseling/or care coordination   Follow up plan: Return in about 3 months (around 03/16/2023) for T2DM, HTN/HLD, HF, COPD, CKD, MOOD.

## 2022-12-23 NOTE — Assessment & Plan Note (Signed)
Acute and improved at this time.  Has completed all treatment.  Will plan on repeat CXR at next visit in 4 weeks.   

## 2022-12-23 NOTE — Telephone Encounter (Signed)
Returned patient's call and informed her that I did look in the trash at the packaging that was discarded and it did not contain another sensor. Patient verbalized understanding

## 2022-12-23 NOTE — Telephone Encounter (Signed)
Copied from CRM [id number]. Topic: General - Other >> Dec 23, 2022 11:54 AM Macon Large wrote: Reason for CRM: Pt stated that she saw Jolene today and she helped her to put on the freestyle libre sensor. Pt stated that the container that had the sensor in it was thrown away and the box states that 2 sensors were included in the packaging. Pt would like to ask that the trash be checked for the other sensor. Pt requests call back to advise. Cb# [phone]

## 2022-12-24 LAB — BASIC METABOLIC PANEL
BUN/Creatinine Ratio: 12 (ref 12–28)
BUN: 15 mg/dL (ref 8–27)
CO2: 29 mmol/L (ref 20–29)
Calcium: 9.6 mg/dL (ref 8.7–10.3)
Chloride: 95 mmol/L — ABNORMAL LOW (ref 96–106)
Creatinine, Ser: 1.22 mg/dL — ABNORMAL HIGH (ref 0.57–1.00)
Glucose: 79 mg/dL (ref 70–99)
Potassium: 5.1 mmol/L (ref 3.5–5.2)
Sodium: 136 mmol/L (ref 134–144)
eGFR: 47 mL/min/{1.73_m2} — ABNORMAL LOW (ref >59)

## 2022-12-24 LAB — CBC WITH DIFFERENTIAL/PLATELET
Basophils Absolute: 0.1 10*3/uL (ref 0.0–0.2)
Basos: 1 %
EOS (ABSOLUTE): 0.5 10*3/uL — ABNORMAL HIGH (ref 0.0–0.4)
Eos: 5 %
Hematocrit: 36.5 % (ref 34.0–46.6)
Hemoglobin: 11.6 g/dL (ref 11.1–15.9)
Immature Grans (Abs): 0 10*3/uL (ref 0.0–0.1)
Immature Granulocytes: 0 %
Lymphocytes Absolute: 3.2 10*3/uL — ABNORMAL HIGH (ref 0.7–3.1)
Lymphs: 34 %
MCH: 30 pg (ref 26.6–33.0)
MCHC: 31.8 g/dL (ref 31.5–35.7)
MCV: 94 fL (ref 79–97)
Monocytes Absolute: 0.8 10*3/uL (ref 0.1–0.9)
Monocytes: 8 %
Neutrophils Absolute: 4.8 10*3/uL (ref 1.4–7.0)
Neutrophils: 52 %
Platelets: 328 10*3/uL (ref 150–450)
RBC: 3.87 x10E6/uL (ref 3.77–5.28)
RDW: 13.1 % (ref 11.7–15.4)
WBC: 9.4 10*3/uL (ref 3.4–10.8)

## 2022-12-24 NOTE — Progress Notes (Signed)
Good morning, please let Jordan Chan know her labs have returned.  Kidney function continues to show stable Stage 3a kidney disease with no worsening, we will monitor closely.  Glucose (sugar) had trended down and a little low at 79 -- monitor this closely and if you notice consistent lows on Freestyle at home (<70) let me know and we will cut back on insulin.  CBC shows improving white blood cell count -- infection appears to be improving.  Good news.  Any questions? Keep being amazing!!  Thank you for allowing me to participate in your care.  I appreciate you. Kindest regards, Dolora Ridgely

## 2022-12-26 ENCOUNTER — Ambulatory Visit: Payer: Self-pay | Admitting: *Deleted

## 2022-12-26 NOTE — Patient Outreach (Signed)
  Care Coordination   Follow Up Visit Note   12/26/2022 Name: Jordan Chan MRN: [id number] DOB: Aug 26, 1950  Jordan Chan is a 72 y.o. year old female who sees Aura Dials T, NP for primary care. I spoke with  Jordan Chan by phone today.  What matters to the patients health and wellness today?  Continue recovering from pneumonia and have uncomplicated hip replacement.    Goals Addressed             This Visit's Progress    Recover from Pneumonia and manage COPD   On track    Care Coordination Interventions: Provided patient with basic written and verbal COPD education on self care/management/and exacerbation prevention Advised patient to track and manage COPD triggers Provided written and verbal instructions on pursed lip breathing and utilized returned demonstration as teach back Provided instruction about proper use of medications used for management of COPD including inhalers Advised patient to self assesses COPD action plan zone and make appointment with provider if in the yellow zone for 48 hours without improvement         SDOH assessments and interventions completed:  No     Care Coordination Interventions:  Yes, provided   Follow up plan: Follow up call scheduled for 6/10    Encounter Outcome:  Pt. Visit Completed   Kemper Durie, RN,MSN,CCM [facility] Care Management Care Management Coordinator [phone]

## 2022-12-30 ENCOUNTER — Other Ambulatory Visit: Payer: Self-pay | Admitting: Nurse Practitioner

## 2022-12-30 DIAGNOSIS — Z96642 Presence of left artificial hip joint: Secondary | ICD-10-CM | POA: Insufficient documentation

## 2022-12-30 DIAGNOSIS — Z96649 Presence of unspecified artificial hip joint: Secondary | ICD-10-CM | POA: Insufficient documentation

## 2022-12-30 MED ORDER — FREESTYLE LIBRE 2 SENSOR MISC: 5 refills | Status: DC

## 2022-12-31 ENCOUNTER — Telehealth: Payer: Self-pay | Admitting: Internal Medicine

## 2022-12-31 NOTE — Telephone Encounter (Signed)
   Pre-operative Risk Assessment    Patient Name: Jordan Chan  DOB: [birth year] MRN: [id number]      Request for Surgical Clearance    Procedure:   LT THA ANTERIOR HIP Surgeon:  DR Cassell Smiles Surgeon's Group or Practice Name:  [facility] Phone number:  [phone] Fax number:  [phone]  Type of Clearance Requested:   - Medical    Type of Anesthesia:  General    Additional requests/questions:    SignedDalia Heading   12/31/2022, 10:13 AM

## 2022-12-31 NOTE — Telephone Encounter (Signed)
Per Dr. Odis Luster office call back today, the pt will need to hold Eliquis. I assured surgeon office once the pt has been cleared we will fax notes and recommendations.

## 2022-12-31 NOTE — Telephone Encounter (Signed)
Pharmacy please advise on holding Eliquis prior to left total hip arthroplasty scheduled for TBD. Thank you.

## 2022-12-31 NOTE — Telephone Encounter (Signed)
Patient with diagnosis of afib on Eliquis for anticoagulation.    Procedure:  LT THA ANTERIOR HIP  Date of procedure: TBD   CHA2DS2-VASc Score = 8   This indicates a 10.8% annual risk of stroke. The patient's score is based upon: CHF History: 1 HTN History: 1 Diabetes History: 1 Stroke History: 2 Vascular Disease History: 1 Age Score: 1 Gender Score: 1      Pt with a hx of TIA in 2010 and provoked PE in 2021.  CrCl 33.9 ml/min  Patient will most likely need to hold Eliquis for 3 days prior to procedure. Although clearance form says general anesthesia, spinal anesthesia is often used. Given patients hx of TIA and PE, will confirm with Dr. Okey Dupre he is ok with 3 day hold.   **This guidance is not considered finalized until pre-operative APP has relayed final recommendations.**

## 2022-12-31 NOTE — Telephone Encounter (Signed)
Tried to reach the surgeon's office to confirm if Eliquis needs to be held as it was not noted on the clearance form. I was able to s/w the surgeon's office about Eliquis if needs to be held. Operator took a message to have Dr. Odis Luster nurse call back confirm.

## 2022-12-31 NOTE — Telephone Encounter (Signed)
Callback team please contact requesting provider's office regarding patient's Eliquis.  Patient is currently on Eliquis and not noted in clearance request.  Please let me know if have any additional questions. Robin Searing, NP

## 2023-01-02 ENCOUNTER — Ambulatory Visit: Payer: Medicare HMO | Attending: General Practice

## 2023-01-02 ENCOUNTER — Telehealth: Payer: Self-pay | Admitting: *Deleted

## 2023-01-02 DIAGNOSIS — Z01810 Encounter for preprocedural cardiovascular examination: Secondary | ICD-10-CM | POA: Diagnosis not present

## 2023-01-02 NOTE — Progress Notes (Signed)
Virtual Visit via Telephone Note   Because of Jordan Chan's co-morbid illnesses, she is at least at moderate risk for complications without adequate follow up.  This format is felt to be most appropriate for this patient at this time.  The patient did not have access to video technology/had technical difficulties with video requiring transitioning to audio format only (telephone).  All issues noted in this document were discussed and addressed.  No physical exam could be performed with this format.  Please refer to the patient's chart for her consent to telehealth for [facility].  Evaluation Performed:  Preoperative cardiovascular risk assessment _____________   Date:  01/02/2023   Patient ID:  Jordan Chan, DOB Jun 28, 1951, MRN [id number] Patient Location:  Home Provider location:   Office  Primary Care Provider:  Marjie Skiff, NP Primary Cardiologist:  Yvonne Kendall, MD  Chief Complaint / Patient Profile   72 y.o. y/o female with a h/o HFrEF, persistent atrial fibrillation, essential hypertension, hyperlipidemia who is pending left hip arthroplasty and presents today for telephonic preoperative cardiovascular risk assessment.  History of Present Illness    Jordan Chan is a 72 y.o. female who presents via audio/video conferencing for a telehealth visit today.  Pt was last seen in cardiology clinic on 10/22/2022 by Dr. Okey Dupre.  At that time TIMIYAH MIRCHANDANI was doing well .  The patient is now pending procedure as outlined above. Since her last visit, she remained stable from a cardiac standpoint.  Today she denies chest pain, shortness of breath, lower extremity edema, fatigue, palpitations, melena, hematuria, hemoptysis, diaphoresis, weakness, presyncope, syncope, orthopnea, and PND.   Past Medical History    Past Medical History:  Diagnosis Date   Arthritis    Asthma    Atrial fibrillation (HCC)    Breast cancer (HCC) 1998   right breast ca  with mastectomy and chemotherapy and radiation   Bronchitis    CHF (congestive heart failure) (HCC)    "with Morphine withdrawal"   COPD (chronic obstructive pulmonary disease) (HCC)    Diabetes mellitus without complication (HCC)    Diverticulitis    diverticulosis also   Dyspnea    Endometriosis    GERD (gastroesophageal reflux disease)    History of shingles 2000-2005   Hypercholesteremia    Hypertension    IBS (irritable bowel syndrome)    Low back pain    a. Implanted morphine/bupivicaine/clonidine pump.   Neuropathy    Orthopnea    Oxygen dependent    uses at night   Personal history of chemotherapy    Personal history of radiation therapy    Pneumonia    pneumonia 5-6 times, history of bronchitis also   Scoliosis    Sleep apnea    does not use cpap   Stroke (HCC) 2010   TIA, 10 years ago   Withdrawal from sedative drug (HCC)    withdrawal from morphine when pump batteries died   Past Surgical History:  Procedure Laterality Date   ABDOMINAL HYSTERECTOMY  1987   BACK SURGERY     Tailbone removed following fracture   BREAST SURGERY Right    mastectomy   CARDIAC CATHETERIZATION     CATARACT EXTRACTION W/PHACO Right 05/19/2019   Procedure: CATARACT EXTRACTION PHACO AND INTRAOCULAR LENS PLACEMENT (IOC), RIGHT, DIABETIC;  Surgeon: Elliot Cousin, MD;  Location: [facility] ORS;  Service: Ophthalmology;  Laterality: Right;  Lot # [id number] H [place]: 00:43.8 CDE: 4.59   CATARACT EXTRACTION W/PHACO Left 06/16/2019  Procedure: CATARACT EXTRACTION PHACO AND INTRAOCULAR LENS PLACEMENT (IOC) LEFT VISION BLUE DIABETIC;  Surgeon: Elliot Cousin, MD;  Location: [facility] ORS;  Service: Ophthalmology;  Laterality: Left;  Lot #[id number] H [place]: 00:46.9 CDE: 6.53   COCCYX REMOVAL     COLONOSCOPY WITH PROPOFOL N/A 01/01/2017   Procedure: COLONOSCOPY WITH PROPOFOL;  Surgeon: Wyline Mood, MD;  Location: [facility] ENDOSCOPY;  Service: Endoscopy;  Laterality: N/A;   COLONOSCOPY WITH PROPOFOL N/A 12/11/2021    Procedure: COLONOSCOPY WITH PROPOFOL;  Surgeon: Toney Reil, MD;  Location: [facility] ENDOSCOPY;  Service: Gastroenterology;  Laterality: N/A;   ELBOW ARTHROSCOPY WITH TENDON RECONSTRUCTION     ESOPHAGOGASTRODUODENOSCOPY (EGD) WITH PROPOFOL N/A 01/01/2017   Procedure: ESOPHAGOGASTRODUODENOSCOPY (EGD) WITH PROPOFOL;  Surgeon: Wyline Mood, MD;  Location: [facility] ENDOSCOPY;  Service: Endoscopy;  Laterality: N/A;   ESOPHAGOGASTRODUODENOSCOPY (EGD) WITH PROPOFOL N/A 12/11/2021   Procedure: ESOPHAGOGASTRODUODENOSCOPY (EGD) WITH PROPOFOL;  Surgeon: Toney Reil, MD;  Location: [facility] ENDOSCOPY;  Service: Gastroenterology;  Laterality: N/A;   INTRATHECAL PUMP IMPLANT     LEFT HEART CATH AND CORONARY ANGIOGRAPHY N/A 04/28/2017   Procedure: LEFT HEART CATH AND CORONARY ANGIOGRAPHY;  Surgeon: Yvonne Kendall, MD;  Location: [facility] INVASIVE CV LAB;  Service: Cardiovascular;  Laterality: N/A;   MASTECTOMY Right 06/1997   morphine pump  2011   PLANTAR FASCIA RELEASE     TRIGGER FINGER RELEASE      Allergies  Allergies  Allergen Reactions   Other Palpitations    IV steroids   Pain Patch [Menthol] Anaphylaxis   Avelox [Moxifloxacin Hcl In Nacl] Other (See Comments)    Upset stomach   Doxycycline Diarrhea   Erythromycin Nausea Only and Other (See Comments)    Can take a Z-Pak just fine Other reaction(s): Other (See Comments) Can take Z-Pak  Can take a Z-Pak just fine   Fentanyl Nausea Only and Rash   Moxifloxacin Hcl Other (See Comments)    Upset stomach Upset stomach   Oxycontin [Oxycodone] Hives   Ozempic [Semaglutide] Nausea Only    Home Medications    Prior to Admission medications   Medication Sig Start Date End Date Taking? Authorizing Provider  ACCU-CHEK AVIVA PLUS test strip TEST THREE TIMES DAILY 05/11/21   [name], Corrie Dandy T, NP  Accu-Chek Softclix Lancets lancets TEST BLOOD SUGAR THREE TIMES DAILY 10/03/22   [name], Corrie Dandy T, NP  albuterol (PROVENTIL HFA;VENTOLIN HFA) 108 (90  BASE) MCG/ACT inhaler Inhale 2 puffs into the lungs every 6 (six) hours as needed for wheezing or shortness of breath.     [provider]  albuterol (PROVENTIL) (2.5 MG/3ML) 0.083% nebulizer solution Take 3 mLs (2.5 mg total) by nebulization every 4 (four) hours as needed for wheezing or shortness of breath. 07/18/22 07/18/23  Dorcas Carrow, MD  Alcohol Swabs (DROPSAFE ALCOHOL PREP) 70 % PADS USE TWICE DAILY  WITH  SUGAR  CHECKS 11/14/21   [name], Corrie Dandy T, NP  apixaban (ELIQUIS) 5 MG TABS tablet Take 1 tablet (5 mg total) by mouth 2 (two) times daily. 06/24/22   [name], Corrie Dandy T, NP  atorvastatin (LIPITOR) 40 MG tablet Take 1 tablet (40 mg total) by mouth daily. 06/25/22   [name], Corrie Dandy T, NP  Blood Glucose Monitoring Suppl (TRUE METRIX METER) w/Device KIT Use to check blood sugar 4 times a day 05/14/21   [name], Corrie Dandy T, NP  busPIRone (BUSPAR) 5 MG tablet Take 1 tablet (5 mg total) by mouth 2 (two) times daily. 05/20/22   Marjie Skiff, NP  cholecalciferol (VITAMIN  D3) 25 MCG (1000 UNIT) tablet Take 1,000 Units by mouth daily.    [provider]  Continuous Glucose Receiver (FREESTYLE LIBRE 2 READER) DEVI To check blood sugars 3-4 times daily due to insulin use. DX E11.40 12/11/22   [name], Corrie Dandy T, NP  Continuous Glucose Sensor (FREESTYLE LIBRE 2 SENSOR) MISC To check blood sugars 3-4 times daily due to insulin use. DX E11.40 12/30/22   [name], Corrie Dandy T, NP  Cyanocobalamin 1000 MCG/ML KIT Inject 1,000 mcg as directed every 30 (thirty) days.    [provider]  cyclobenzaprine (FLEXERIL) 5 MG tablet Take 5 mg by mouth 2 (two) times daily as needed for muscle spasms. Takes very rarely    [provider]  Dulaglutide (TRULICITY) 4.5 MG/0.5ML SOPN Inject 4.5 mg as directed once a week. 03/04/22   [name], Corrie Dandy T, NP  Fluticasone-Umeclidin-Vilant (TRELEGY ELLIPTA) 100-62.5-25 MCG/ACT AEPB Inhale 1 puff into the lungs daily. 09/24/22   Aura Dials T, NP   folic acid (FOLVITE) 1 MG tablet TAKE 1 TABLET EVERY DAY 10/14/21   Alinda Dooms, NP  furosemide (LASIX) 40 MG tablet Take 0.5 tablets (20 mg total) by mouth daily. 10/22/22   End, Cristal Deer, MD  gabapentin (NEURONTIN) 800 MG tablet Take 1 tablet (800 mg total) by mouth 3 (three) times daily. 12/12/21   [name], Corrie Dandy T, NP  insulin degludec (TRESIBA FLEXTOUCH) 100 UNIT/ML FlexTouch Pen Inject 55 Units into the skin at bedtime. 09/26/22   [name], Corrie Dandy T, NP  insulin lispro (HUMALOG KWIKPEN) 100 UNIT/ML KwikPen Inject 5 Units into the skin 3 (three) times daily before meals. Do not inject insulin if you are not going to eat meal.  Only take before a meal. 12/11/22   [name], Jolene T, NP  losartan (COZAAR) 25 MG tablet Take 0.5 tablets (12.5 mg total) by mouth daily. 06/24/22   [name], Corrie Dandy T, NP  metFORMIN (GLUCOPHAGE) 1000 MG tablet TAKE 1 TABLET TWICE DAILY WITH MEALS 12/04/21   [name], Corrie Dandy T, NP  metoprolol succinate (TOPROL-XL) 25 MG 24 hr tablet Take 0.5 tablets (12.5 mg total) by mouth daily. 06/24/22   [name], Corrie Dandy T, NP  montelukast (SINGULAIR) 10 MG tablet Take 1 tablet (10 mg total) by mouth at bedtime. 06/24/22   Aura Dials T, NP  Nebulizers (EASY AIR COMPRESSOR NEBULIZER) MISC Use to inhaler nebulizer treatments at needed per instructions on nebulizer prescription 07/21/22   [name], Corrie Dandy T, NP  ondansetron (ZOFRAN) 4 MG tablet Take 1 tablet (4 mg total) by mouth every 8 (eight) hours as needed for nausea or vomiting. 03/14/22   [name], Corrie Dandy T, NP  PAIN MANAGEMENT INTRATHECAL, IT, PUMP 1 each by Intrathecal route. Intrathecal (IT) medication:  Morphine Patient does not remember current. Adjusted every 2.5 months at [facility] in [place].    [provider]  pantoprazole (PROTONIX) 40 MG tablet Take 1 tablet (40 mg total) by mouth daily. 12/12/21   [name], Corrie Dandy T, NP  venlafaxine XR (EFFEXOR-XR) 150 MG 24 hr capsule Take 1 capsule (150 mg  total) by mouth daily. 06/24/22   Marjie Skiff, NP    Physical Exam    Vital Signs:  SHATASIA CEREZO does not have vital signs available for review today.  Given telephonic nature of communication, physical exam is limited. AAOx3. NAD. Normal affect.  Speech and respirations are unlabored.  Accessory Clinical Findings    None  Assessment & Plan    1.  Preoperative Cardiovascular Risk Assessment: Left  total hip arthroplasty anterior approach, Dr. Cassell Smiles, [facility], fax #[phone]      Primary Cardiologist: Yvonne Kendall, MD  Chart reviewed as part of pre-operative protocol coverage. Given past medical history and time since last visit, based on ACC/AHA guidelines, SHEKINAH CIRINO would be at acceptable risk for the planned procedure without further cardiovascular testing.   Her RCRI is a class IV risk, 11% risk of major cardiac event.  She is able to complete greater than 4 METS of physical activity.  Her apixaban may be held for 3 days prior to her surgery. Please resume as soon as hemostasis is achieved.   Patient was advised that if she develops new symptoms prior to surgery to contact our office to arrange a follow-up appointment.  She verbalized understanding.  I will route this recommendation to the requesting party via Epic fax function and remove from pre-op pool.      Time:   Today, I have spent 5 minutes with the patient with telehealth technology discussing medical history, symptoms, and management plan.  Prior to her phone evaluation I spent greater than 10 minutes reviewing her past medical history and cardiac medications.   Ronney Asters, NP  01/02/2023, 2:57 PM

## 2023-01-02 NOTE — Telephone Encounter (Signed)
I called Dr. Odis Luster office to confirm procedure date, confirmed it was 01/08/23. I then call the pt and added pt to pre op schedule today ok per Edd Fabian, FNP due to procedure date and med hold. Med rec and consent are done.

## 2023-01-02 NOTE — Telephone Encounter (Signed)
I called Dr. Odis Luster office to confirm procedure date, confirmed it was 01/08/23. I then call the pt and added pt to pre op schedule today ok per Edd Fabian, FNP due to procedure date and med hold. Med rec and consent are done.      Patient Consent for Virtual Visit        Jordan Chan has provided verbal consent on 01/02/2023 for a virtual visit (video or telephone).   CONSENT FOR VIRTUAL VISIT FOR:  Jordan Chan  By participating in this virtual visit I agree to the following:  I hereby voluntarily request, consent and authorize [facility] and its employed or contracted physicians, physician assistants, nurse practitioners or other licensed health care professionals (the Practitioner), to provide me with telemedicine health care services (the "Services") as deemed necessary by the treating Practitioner. I acknowledge and consent to receive the Services by the Practitioner via telemedicine. I understand that the telemedicine visit will involve communicating with the Practitioner through live audiovisual communication technology and the disclosure of certain medical information by electronic transmission. I acknowledge that I have been given the opportunity to request an in-person assessment or other available alternative prior to the telemedicine visit and am voluntarily participating in the telemedicine visit.  I understand that I have the right to withhold or withdraw my consent to the use of telemedicine in the course of my care at any time, without affecting my right to future care or treatment, and that the Practitioner or I may terminate the telemedicine visit at any time. I understand that I have the right to inspect all information obtained and/or recorded in the course of the telemedicine visit and may receive copies of available information for a reasonable fee.  I understand that some of the potential risks of receiving the Services via telemedicine include:  Delay  or interruption in medical evaluation due to technological equipment failure or disruption; Information transmitted may not be sufficient (e.g. poor resolution of images) to allow for appropriate medical decision making by the Practitioner; and/or  In rare instances, security protocols could fail, causing a breach of personal health information.  Furthermore, I acknowledge that it is my responsibility to provide information about my medical history, conditions and care that is complete and accurate to the best of my ability. I acknowledge that Practitioner's advice, recommendations, and/or decision may be based on factors not within their control, such as incomplete or inaccurate data provided by me or distortions of diagnostic images or specimens that may result from electronic transmissions. I understand that the practice of medicine is not an exact science and that Practitioner makes no warranties or guarantees regarding treatment outcomes. I acknowledge that a copy of this consent can be made available to me via my patient portal [facility] MyChart), or I can request a printed copy by calling the office of  [facility].    I understand that my insurance will be billed for this visit.   I have read or had this consent read to me. I understand the contents of this consent, which adequately explains the benefits and risks of the Services being provided via telemedicine.  I have been provided ample opportunity to ask questions regarding this consent and the Services and have had my questions answered to my satisfaction. I give my informed consent for the services to be provided through the use of telemedicine in my medical care

## 2023-01-02 NOTE — Telephone Encounter (Signed)
   Name: Jordan Chan  DOB: 1951/05/26  MRN: [id number]  Primary Cardiologist: Yvonne Kendall, MD   Preoperative team, please contact this patient and set up a phone call appointment for further preoperative risk assessment. Please obtain consent and complete medication review. Thank you for your help.  I confirm that guidance regarding antiplatelet and oral anticoagulation therapy has been completed and, if necessary, noted below.  Her apixaban may be held for 3 days prior to her surgery.  Please resume as soon as hemostasis is achieved.     Ronney Asters, NP 01/02/2023, 2:04 PM [facility]

## 2023-01-02 NOTE — Telephone Encounter (Signed)
I am in agreement with holding apixaban for 3 days prior to procedure and restarting it as soon as possible afterwards.  She is at high risk for VTE, given that she is undergoing orthopedic surgery and has h/o PE.  Yvonne Kendall, MD [facility]

## 2023-01-05 ENCOUNTER — Ambulatory Visit
Admission: RE | Admit: 2023-01-05 | Discharge: 2023-01-05 | Disposition: A | Discharge status: Home / Self Care | Payer: Medicare HMO | Source: Ambulatory Visit | Attending: Nurse Practitioner | Admitting: Nurse Practitioner

## 2023-01-05 ENCOUNTER — Ambulatory Visit (INDEPENDENT_AMBULATORY_CARE_PROVIDER_SITE_OTHER): Payer: Medicare HMO

## 2023-01-05 ENCOUNTER — Ambulatory Visit
Admission: RE | Admit: 2023-01-05 | Discharge: 2023-01-05 | Disposition: A | Discharge status: Home / Self Care | Payer: Medicare HMO | Attending: Nurse Practitioner | Admitting: Nurse Practitioner

## 2023-01-05 DIAGNOSIS — J189 Pneumonia, unspecified organism: Secondary | ICD-10-CM | POA: Diagnosis present

## 2023-01-05 DIAGNOSIS — E538 Deficiency of other specified B group vitamins: Secondary | ICD-10-CM

## 2023-01-05 MED ORDER — CYANOCOBALAMIN 1000 MCG/ML IJ SOLN
1000.0000 ug | INTRAMUSCULAR | Status: AC
Start: 2023-01-05 — End: 2023-07-04
  Administered 2023-01-05 – 2023-06-12 (×4): 1000 ug via INTRAMUSCULAR

## 2023-01-06 ENCOUNTER — Telehealth: Payer: Self-pay

## 2023-01-06 NOTE — Progress Notes (Signed)
Opened in error

## 2023-01-07 ENCOUNTER — Telehealth: Payer: Self-pay | Admitting: Nurse Practitioner

## 2023-01-07 DIAGNOSIS — M5442 Lumbago with sciatica, left side: Secondary | ICD-10-CM | POA: Diagnosis not present

## 2023-01-07 DIAGNOSIS — G893 Neoplasm related pain (acute) (chronic): Secondary | ICD-10-CM | POA: Diagnosis not present

## 2023-01-07 NOTE — Progress Notes (Signed)
Contacted via MyChart   Overall imaging is showing improving pneumonia.  Great news!!

## 2023-01-07 NOTE — Telephone Encounter (Unsigned)
Copied from CRM [id number]. Topic: General - Inquiry >> Jan 07, 2023 12:48 PM De Blanch wrote: Reason for CRM: Pt stated she was supposed to have surgery, but her A1C was too high. Pt is asking when her A1C was last checked, requesting an exact date, and asking if she has to wait 3 months for a recheck.  Please advise.

## 2023-01-08 NOTE — Telephone Encounter (Signed)
Patient made aware of Provider's recommendations and verbalized understanding.   

## 2023-01-11 NOTE — Patient Instructions (Signed)
Diabetes Mellitus Basics  Diabetes mellitus, or diabetes, is a long-term (chronic) disease. It occurs when the body does not properly use sugar (glucose) that is released from food after you eat. Diabetes mellitus may be caused by one or both of these problems: Your pancreas does not make enough of a hormone called insulin. Your body does not react in a normal way to the insulin that it makes. Insulin lets glucose enter cells in your body. This gives you energy. If you have diabetes, glucose cannot get into cells. This causes high blood glucose (hyperglycemia). How to treat and manage diabetes You may need to take insulin or other diabetes medicines daily to keep your glucose in balance. If you are prescribed insulin, you will learn how to give yourself insulin by injection. You may need to adjust the amount of insulin you take based on the foods that you eat. You will need to check your blood glucose levels using a glucose monitor as told by your health care provider. The readings can help determine if you have low or high blood glucose. Generally, you should have these blood glucose levels: Before meals (preprandial): 80-130 mg/dL (4.4-7.2 mmol/L). After meals (postprandial): below 180 mg/dL (10 mmol/L). Hemoglobin A1c (HbA1c) level: less than 7%. Your health care provider will set treatment goals for you. Keep all follow-up visits. This is important. Follow these instructions at home: Diabetes medicines Take your diabetes medicines every day as told by your health care provider. List your diabetes medicines here: Name of medicine: ______________________________ Amount (dose): _______________ Time (a.m./p.m.): _______________ Notes: ___________________________________ Name of medicine: ______________________________ Amount (dose): _______________ Time (a.m./p.m.): _______________ Notes: ___________________________________ Name of medicine: ______________________________ Amount (dose):  _______________ Time (a.m./p.m.): _______________ Notes: ___________________________________ Insulin If you use insulin, list the types of insulin you use here: Insulin type: ______________________________ Amount (dose): _______________ Time (a.m./p.m.): _______________Notes: ___________________________________ Insulin type: ______________________________ Amount (dose): _______________ Time (a.m./p.m.): _______________ Notes: ___________________________________ Insulin type: ______________________________ Amount (dose): _______________ Time (a.m./p.m.): _______________ Notes: ___________________________________ Insulin type: ______________________________ Amount (dose): _______________ Time (a.m./p.m.): _______________ Notes: ___________________________________ Insulin type: ______________________________ Amount (dose): _______________ Time (a.m./p.m.): _______________ Notes: ___________________________________ Managing blood glucose  Check your blood glucose levels using a glucose monitor as told by your health care provider. Write down the times that you check your glucose levels here: Time: _______________ Notes: ___________________________________ Time: _______________ Notes: ___________________________________ Time: _______________ Notes: ___________________________________ Time: _______________ Notes: ___________________________________ Time: _______________ Notes: ___________________________________ Time: _______________ Notes: ___________________________________  Low blood glucose Low blood glucose (hypoglycemia) is when glucose is at or below 70 mg/dL (3.9 mmol/L). Symptoms may include: Feeling: Hungry. Sweaty and clammy. Irritable or easily upset. Dizzy. Sleepy. Having: A fast heartbeat. A headache. A change in your vision. Numbness around the mouth, lips, or tongue. Having trouble with: Moving (coordination). Sleeping. Treating low blood glucose To treat low blood  glucose, eat or drink something containing sugar right away. If you can think clearly and swallow safely, follow the 15:15 rule: Take 15 grams of a fast-acting carb (carbohydrate), as told by your health care provider. Some fast-acting carbs are: Glucose tablets: take 3-4 tablets. Hard candy: eat 3-5 pieces. Fruit juice: drink 4 oz (120 mL). Regular (not diet) soda: drink 4-6 oz (120-180 mL). Honey or sugar: eat 1 Tbsp (15 mL). Check your blood glucose levels 15 minutes after you take the carb. If your glucose is still at or below 70 mg/dL (3.9 mmol/L), take 15 grams of a carb again. If your glucose does not go above 70 mg/dL (3.9 mmol/L) after   3 tries, get help right away. After your glucose goes back to normal, eat a meal or a snack within 1 hour. Treating very low blood glucose If your glucose is at or below 54 mg/dL (3 mmol/L), you have very low blood glucose (severe hypoglycemia). This is an emergency. Do not wait to see if the symptoms will go away. Get medical help right away. Call your local emergency services (911 in the [place] Do not drive yourself to the hospital. Questions to ask your health care provider Should I talk with a diabetes educator? What equipment will I need to care for myself at home? What diabetes medicines do I need? When should I take them? How often do I need to check my blood glucose levels? What number can I call if I have questions? When is my follow-up visit? Where can I find a support group for people with diabetes? Where to find more information American Diabetes Association: www.diabetes.org Association of Diabetes Care and Education Specialists: www.diabeteseducator.org Contact a health care provider if: Your blood glucose is at or above 240 mg/dL (13.3 mmol/L) for 2 days in a row. You have been sick or have had a fever for 2 days or more, and you are not getting better. You have any of these problems for more than 6 hours: You cannot eat or  drink. You feel nauseous. You vomit. You have diarrhea. Get help right away if: Your blood glucose is lower than 54 mg/dL (3 mmol/L). You get confused. You have trouble thinking clearly. You have trouble breathing. These symptoms may represent a serious problem that is an emergency. Do not wait to see if the symptoms will go away. Get medical help right away. Call your local emergency services (911 in the [place] Do not drive yourself to the hospital. Summary Diabetes mellitus is a chronic disease that occurs when the body does not properly use sugar (glucose) that is released from food after you eat. Take insulin and diabetes medicines as told. Check your blood glucose every day, as often as told. Keep all follow-up visits. This is important. This information is not intended to replace advice given to you by your health care provider. Make sure you discuss any questions you have with your health care provider. Document Revised: 11/22/2019 Document Reviewed: 11/22/2019 Elsevier Patient Education  2024 [employer]  

## 2023-01-12 ENCOUNTER — Ambulatory Visit: Payer: Self-pay | Admitting: *Deleted

## 2023-01-12 NOTE — Patient Outreach (Signed)
  Care Coordination   Follow Up Visit Note   01/12/2023 Name: Jordan Chan MRN: [id number] DOB: [birth year]  [name] is a 72 y.o. year old female who sees Aura Dials T, NP for primary care. I spoke with  [name] by phone today.  What matters to the patients health and wellness today?  Have hip surgery     Goals Addressed             This Visit's Progress    Decrease A1C in effort to have hip surgery       Care Coordination Interventions: Provided education to patient about basic DM disease process Reviewed medications with patient and discussed importance of medication adherence Counseled on importance of regular laboratory monitoring as prescribed Discussed plans with patient for ongoing care management follow up and provided patient with direct contact information for care management team Provided patient with written educational materials related to hypo and hyperglycemia and importance of correct treatment      Recover from Pneumonia and manage COPD   On track    Care Coordination Interventions: Provided patient with basic written and verbal COPD education on self care/management/and exacerbation prevention Advised patient to track and manage COPD triggers Provided written and verbal instructions on pursed lip breathing and utilized returned demonstration as teach back Provided instruction about proper use of medications used for management of COPD including inhalers Advised patient to self assesses COPD action plan zone and make appointment with provider if in the yellow zone for 48 hours without improvement          SDOH assessments and interventions completed:  No     Care Coordination Interventions:  Yes, provided   Interventions Today    Flowsheet Row Most Recent Value  Chronic Disease   Chronic disease during today's visit Diabetes, Chronic Obstructive Pulmonary Disease (COPD)  General Interventions   General Interventions  Discussed/Reviewed General Interventions Reviewed, Labs, Doctor Visits  Labs Hgb A1c every 3 months  [most recent A1C is 9.2 last month, state this was after having steroids for COPD. Wanting new labs done.  Per ortho, need to be less than 7.2 to have sugery]  Doctor Visits Discussed/Reviewed Doctor Visits Reviewed, PCP, Specialist  [PCP on 6/12, will ask to have new A1C as she is still having pain and think A1C should now be lower]  PCP/Specialist Visits Compliance with follow-up visit  [multiple appointments scheduled with [facility], patient not aware of appointments, will call their office to follow up]  Education Interventions   Education Provided Provided Education  Provided Verbal Education On Blood Sugar Monitoring, Nutrition, Medication  [blood sugar readings range 140-180s]  Nutrition Interventions   Nutrition Discussed/Reviewed Nutrition Reviewed, Carbohydrate meal planning, Adding fruits and vegetables, Decreasing sugar intake, Portion sizes       Follow up plan: Follow up call scheduled for 6/25    Encounter Outcome:  Pt. Visit Completed   Kemper Durie, RN, MSN, [facility] Care Management Care Management Coordinator [phone]

## 2023-01-14 ENCOUNTER — Ambulatory Visit (INDEPENDENT_AMBULATORY_CARE_PROVIDER_SITE_OTHER): Payer: Medicare HMO | Admitting: Nurse Practitioner

## 2023-01-14 ENCOUNTER — Encounter: Payer: Self-pay | Admitting: Nurse Practitioner

## 2023-01-14 VITALS — BP 135/79 | HR 87 | Temp 98.3°F | Ht 58.5 in | Wt 148.4 lb

## 2023-01-14 DIAGNOSIS — Z794 Long term (current) use of insulin: Secondary | ICD-10-CM

## 2023-01-14 DIAGNOSIS — E119 Type 2 diabetes mellitus without complications: Secondary | ICD-10-CM

## 2023-01-14 LAB — BAYER DCA HB A1C WAIVED: HB A1C (BAYER DCA - WAIVED): 8.1 % — ABNORMAL HIGH (ref 4.8–5.6)

## 2023-01-14 NOTE — Assessment & Plan Note (Signed)
Chronic, ongoing, insulin dependent. A1c 8.1% today, was 9.2% on 12/11/22, trend up after recent steroids and sepsis.  Discussed at length with her today.  Had alerted ortho to result.  She is having trend down in blood sugar with addition of meal time insulin at this time. - Will continue Metformin at max dosing, Trulicity 4.5 MG weekly, and Tresiba 55 units, increase 3 units in 3 days if fasting sugar not less then 130 consistently recommended.  Continue meal time insulin, 2-5 units prior to meals and will increase as needed.  Educated her on this.  Reviewed CCM team pharmacist note for further recommendations.   - Freestyle placed in office today with PCP assistance and instructed on how to use, to contact PCP if any issues with this. - Recommend she continue to monitor BS consistently at home and document + focus heavily on diet changes.  She is aware to notify provider if fasting BS >130 consistently or <70, as may need to adjust insulin further.   - In past poorly tolerated Jardiance and Mounjaro -- discussed with her that and how SGLT2 would benefit HF -- however concern for side effects.   - Return in August for A1c check.

## 2023-01-14 NOTE — Progress Notes (Addendum)
BP 135/79   Pulse 87   Temp 98.3 F (36.8 C) (Oral)   Ht 4' 10.5" (1.486 m)   Wt 148 lb 6.4 oz (67.3 kg)   LMP  (LMP Unknown)   SpO2 98%   BMI 30.48 kg/m    Subjective:    [name], female    DOB: 26-Sep-1950, 72 y.o.   MRN: [id number]  HPI: Jordan Chan is a 72 y.o. female  Chief Complaint  Patient presents with   Diabetes    Started Humalog before meals at 5/21 visit   DIABETES A1c 9.2% in May. Continues on Metformin 1000 MG BID, Trulicity 4.5 MG weekly, Tresiba 55 units at HS, plus we started meal time insulin on 12/11/22 to assist with A1c as goal of hip surgery upcoming. Currently taking 2-7 units insulin before meals.  She is in a lot of pain and would like A1c checked today, aware this may not have changed much. Has had steroid injections in past which offered minimal benefit (lasted 4 days) -- was scheduled for surgery 01/08/23, but A1c was above goal.  Does follow with pain clinic [facility] and last saw 01/07/23.   Jardiance made her sick in past and Mounjaro gave her severe diarrhea and abdominal pain.  Works with [employer] team and provider.  Hypoglycemic episodes:yes, a couple, but reduced meal time Polydipsia/polyuria: no Visual disturbance: no Chest pain: no Paresthesias: no Glucose Monitoring: yes  Accucheck frequency: Daily  Fasting glucose:  Post prandial:  Evening:  Before meals: Taking Insulin?: yes  Long acting insulin: 55 units  Short acting insulin: 2-7 units meal time (often less then 6) Blood Pressure Monitoring: daily Retinal Examination: Up to Date Foot Exam: Up to Date Pneumovax: Up to Date Influenza: Up to Date Aspirin: no   Relevant past medical, surgical, family and social history reviewed and updated as indicated. Interim medical history since our last visit reviewed. Allergies and medications reviewed and updated.  Review of Systems  Constitutional:  Negative for activity change, appetite change, diaphoresis, fatigue  and fever.  Respiratory:  Positive for shortness of breath (baseline). Negative for cough, chest tightness and wheezing.   Cardiovascular:  Negative for chest pain, palpitations and leg swelling.  Gastrointestinal: Negative.   Endocrine: Negative for heat intolerance, polydipsia, polyphagia and polyuria.  Musculoskeletal:  Positive for arthralgias (hip).  Neurological: Negative.   Psychiatric/Behavioral: Negative.     Per HPI unless specifically indicated above     Objective:    BP 135/79   Pulse 87   Temp 98.3 F (36.8 C) (Oral)   Ht 4' 10.5" (1.486 m)   Wt 148 lb 6.4 oz (67.3 kg)   LMP  (LMP Unknown)   SpO2 98%   BMI 30.48 kg/m   Wt Readings from Last 3 Encounters:  01/14/23 148 lb 6.4 oz (67.3 kg)  12/23/22 149 lb (67.6 kg)  12/23/22 149 lb 9.6 oz (67.9 kg)    Physical Exam Vitals and nursing note reviewed.  Constitutional:      General: She is awake. She is not in acute distress.    Appearance: She is well-developed and well-groomed. She is not ill-appearing.  HENT:     Head: Normocephalic.     Right Ear: Hearing normal.     Left Ear: Hearing normal.  Eyes:     General: Lids are normal.        Right eye: No discharge.        Left eye: No  discharge.     Conjunctiva/sclera: Conjunctivae normal.     Pupils: Pupils are equal, round, and reactive to light.  Neck:     Thyroid: No thyromegaly.     Vascular: No carotid bruit.  Cardiovascular:     Rate and Rhythm: Normal rate and regular rhythm.     Heart sounds: Normal heart sounds. No murmur heard.    No gallop.  Pulmonary:     Effort: Pulmonary effort is normal. No accessory muscle usage or respiratory distress.     Breath sounds: Normal breath sounds. No decreased breath sounds, wheezing or rhonchi.  Abdominal:     General: Bowel sounds are normal.     Palpations: Abdomen is soft.  Musculoskeletal:     Cervical back: Normal range of motion and neck supple.     Right lower leg: No edema.     Left lower leg:  No edema.  Lymphadenopathy:     Cervical: No cervical adenopathy.  Skin:    General: Skin is warm and dry.  Neurological:     Mental Status: She is alert and oriented to person, place, and time.  Psychiatric:        Attention and Perception: Attention normal.        Mood and Affect: Mood normal. Affect is tearful.        Speech: Speech normal.        Behavior: Behavior normal. Behavior is cooperative.        Thought Content: Thought content normal.     Comments: Very teary-eyed and crying due to pain and cancellation of recent surgery.    Diabetic Foot Exam - Simple   Simple Foot Form Visual Inspection No deformities, no ulcerations, no other skin breakdown bilaterally: Yes Sensation Testing See comments: Yes Pulse Check Posterior Tibialis and Dorsalis pulse intact bilaterally: Yes Comments Sensation reduced bilateral feet, right 6/10 and left 7/10.     Results for orders placed or performed in visit on 12/23/22  Basic metabolic panel  Result Value Ref Range   Glucose 79 70 - 99 mg/dL   BUN 15 8 - 27 mg/dL   Creatinine, Ser 9.60 (H) 0.57 - 1.00 mg/dL   eGFR 47 (L) >45 WU/JWJ/1.91   BUN/Creatinine Ratio 12 12 - 28   Sodium 136 134 - 144 mmol/L   Potassium 5.1 3.5 - 5.2 mmol/L   Chloride 95 (L) 96 - 106 mmol/L   CO2 29 20 - 29 mmol/L   Calcium 9.6 8.7 - 10.3 mg/dL  CBC with Differential/Platelet  Result Value Ref Range   WBC 9.4 3.4 - 10.8 x10E3/uL   RBC 3.87 3.77 - 5.28 x10E6/uL   Hemoglobin 11.6 11.1 - 15.9 g/dL   Hematocrit 47.8 29.5 - 46.6 %   MCV 94 79 - 97 fL   MCH 30.0 26.6 - 33.0 pg   MCHC 31.8 31.5 - 35.7 g/dL   RDW 62.1 30.8 - 65.7 %   Platelets 328 150 - 450 x10E3/uL   Neutrophils 52 Not Estab. %   Lymphs 34 Not Estab. %   Monocytes 8 Not Estab. %   Eos 5 Not Estab. %   Basos 1 Not Estab. %   Neutrophils Absolute 4.8 1.4 - 7.0 x10E3/uL   Lymphocytes Absolute 3.2 (H) 0.7 - 3.1 x10E3/uL   Monocytes Absolute 0.8 0.1 - 0.9 x10E3/uL   EOS (ABSOLUTE)  0.5 (H) 0.0 - 0.4 x10E3/uL   Basophils Absolute 0.1 0.0 - 0.2 x10E3/uL   Immature Granulocytes 0  Not Estab. %   Immature Grans (Abs) 0.0 0.0 - 0.1 x10E3/uL      Assessment & Plan:   Problem List Items Addressed This Visit       Endocrine   Insulin dependent type 2 diabetes mellitus (HCC) - Primary    Chronic, ongoing, insulin dependent. A1c 8.1% today, was 9.2% on 12/11/22, trend up after recent steroids and sepsis.  Discussed at length with her today.  Had alerted ortho to result.  She is having trend down in blood sugar with addition of meal time insulin at this time. - Will continue Metformin at max dosing, Trulicity 4.5 MG weekly, and Tresiba 55 units, increase 3 units in 3 days if fasting sugar not less then 130 consistently recommended.  Continue meal time insulin, 2-5 units prior to meals and will increase as needed.  Educated her on this.  Reviewed CCM team pharmacist note for further recommendations.   - Freestyle placed in office today with PCP assistance and instructed on how to use, to contact PCP if any issues with this. - Recommend she continue to monitor BS consistently at home and document + focus heavily on diet changes.  She is aware to notify provider if fasting BS >130 consistently or <70, as may need to adjust insulin further.   - In past poorly tolerated Jardiance and Mounjaro -- discussed with her that and how SGLT2 would benefit HF -- however concern for side effects.   - Return in August for A1c check.       Relevant Orders   Bayer DCA Hb A1c Waived     Follow up plan: Return in about 8 weeks (around 03/12/2023) for T2DM, HTN/HLD, COPD -- please make sure is on schedule for August.

## 2023-01-16 DIAGNOSIS — Z0189 Encounter for other specified special examinations: Secondary | ICD-10-CM | POA: Diagnosis not present

## 2023-01-16 DIAGNOSIS — M1612 Unilateral primary osteoarthritis, left hip: Secondary | ICD-10-CM | POA: Diagnosis not present

## 2023-01-27 ENCOUNTER — Encounter: Payer: Medicare HMO | Admitting: *Deleted

## 2023-01-28 ENCOUNTER — Ambulatory Visit: Payer: Self-pay | Admitting: *Deleted

## 2023-01-28 NOTE — Patient Outreach (Signed)
  Care Coordination   Follow Up Visit Note   01/28/2023 Name: Jordan Chan MRN: [id number] DOB: [birth year]  Jordan Chan is a 72 y.o. year old female who sees Aura Dials T, NP for primary care. I spoke with  Jordan Chan by phone today.  What matters to the patients health and wellness today?  Keep decreasing A1C    Goals Addressed             This Visit's Progress    Decrease A1C in effort to have hip surgery   On track    Care Coordination Interventions: Provided education to patient about basic DM disease process Reviewed medications with patient and discussed importance of medication adherence Counseled on importance of regular laboratory monitoring as prescribed Discussed plans with patient for ongoing care management follow up and provided patient with direct contact information for care management team Provided patient with written educational materials related to hypo and hyperglycemia and importance of correct treatment      Recover from Pneumonia and manage COPD   On track    Care Coordination Interventions: Provided patient with basic written and verbal COPD education on self care/management/and exacerbation prevention Advised patient to track and manage COPD triggers Provided written and verbal instructions on pursed lip breathing and utilized returned demonstration as teach back Provided instruction about proper use of medications used for management of COPD including inhalers Advised patient to self assesses COPD action plan zone and make appointment with provider if in the yellow zone for 48 hours without improvement          SDOH assessments and interventions completed:  No     Care Coordination Interventions:  Yes, provided   Interventions Today    Flowsheet Row Most Recent Value  Chronic Disease   Chronic disease during today's visit Chronic Obstructive Pulmonary Disease (COPD), Diabetes  General Interventions   General  Interventions Discussed/Reviewed General Interventions Reviewed, Labs  Labs Hgb A1c every 3 months  [A1C decreased from 9.2 down to 8.1, goal 7.2 for hip surgery]  Doctor Visits Discussed/Reviewed Doctor Visits Reviewed, PCP, Specialist  [Surgery scheduled for 8/29, hoping to have another A1C done prior to that to have date moved up]  PCP/Specialist Visits Compliance with follow-up visit  Education Interventions   Education Provided Provided Education  Provided Verbal Education On Blood Sugar Monitoring, When to see the doctor  [Blood sugars remains stable, 148 today after eating]       Follow up plan: Follow up call scheduled for 7/24    Encounter Outcome:  Pt. Visit Completed   Kemper Durie, RN, MSN, [facility] Care Management Care Management Coordinator [phone]

## 2023-02-03 ENCOUNTER — Ambulatory Visit: Payer: Medicare HMO

## 2023-02-10 DIAGNOSIS — J449 Chronic obstructive pulmonary disease, unspecified: Secondary | ICD-10-CM | POA: Diagnosis not present

## 2023-02-12 ENCOUNTER — Ambulatory Visit (INDEPENDENT_AMBULATORY_CARE_PROVIDER_SITE_OTHER): Payer: Medicare HMO

## 2023-02-12 ENCOUNTER — Other Ambulatory Visit: Payer: Self-pay

## 2023-02-12 DIAGNOSIS — E538 Deficiency of other specified B group vitamins: Secondary | ICD-10-CM | POA: Diagnosis not present

## 2023-02-12 DIAGNOSIS — D75839 Thrombocytosis, unspecified: Secondary | ICD-10-CM

## 2023-02-13 ENCOUNTER — Other Ambulatory Visit: Payer: Self-pay | Admitting: Nurse Practitioner

## 2023-02-13 ENCOUNTER — Inpatient Hospital Stay (HOSPITAL_BASED_OUTPATIENT_CLINIC_OR_DEPARTMENT_OTHER): Payer: Medicare HMO | Admitting: Oncology

## 2023-02-13 ENCOUNTER — Encounter: Payer: Self-pay | Admitting: Oncology

## 2023-02-13 ENCOUNTER — Inpatient Hospital Stay: Payer: Medicare HMO | Attending: Oncology

## 2023-02-13 VITALS — BP 111/40 | HR 81 | Temp 97.6°F | Resp 16 | Wt 146.7 lb

## 2023-02-13 DIAGNOSIS — D72829 Elevated white blood cell count, unspecified: Secondary | ICD-10-CM | POA: Diagnosis present

## 2023-02-13 DIAGNOSIS — D729 Disorder of white blood cells, unspecified: Secondary | ICD-10-CM | POA: Diagnosis not present

## 2023-02-13 DIAGNOSIS — D75839 Thrombocytosis, unspecified: Secondary | ICD-10-CM

## 2023-02-13 DIAGNOSIS — Z87891 Personal history of nicotine dependence: Secondary | ICD-10-CM | POA: Insufficient documentation

## 2023-02-13 DIAGNOSIS — Z801 Family history of malignant neoplasm of trachea, bronchus and lung: Secondary | ICD-10-CM | POA: Insufficient documentation

## 2023-02-13 DIAGNOSIS — Z800 Family history of malignant neoplasm of digestive organs: Secondary | ICD-10-CM | POA: Diagnosis not present

## 2023-02-13 DIAGNOSIS — D72828 Other elevated white blood cell count: Secondary | ICD-10-CM

## 2023-02-13 DIAGNOSIS — Z853 Personal history of malignant neoplasm of breast: Secondary | ICD-10-CM | POA: Insufficient documentation

## 2023-02-13 LAB — CBC WITH DIFFERENTIAL (CANCER CENTER ONLY)
Abs Immature Granulocytes: 0.02 10*3/uL (ref 0.00–0.07)
Basophils Absolute: 0.1 10*3/uL (ref 0.0–0.1)
Basophils Relative: 1 %
Eosinophils Absolute: 0.4 10*3/uL (ref 0.0–0.5)
Eosinophils Relative: 5 %
HCT: 34.2 % — ABNORMAL LOW (ref 36.0–46.0)
Hemoglobin: 11.2 g/dL — ABNORMAL LOW (ref 12.0–15.0)
Immature Granulocytes: 0 %
Lymphocytes Relative: 30 %
Lymphs Abs: 2.9 10*3/uL (ref 0.7–4.0)
MCH: 30 pg (ref 26.0–34.0)
MCHC: 32.7 g/dL (ref 30.0–36.0)
MCV: 91.7 fL (ref 80.0–100.0)
Monocytes Absolute: 0.9 10*3/uL (ref 0.1–1.0)
Monocytes Relative: 9 %
Neutro Abs: 5.2 10*3/uL (ref 1.7–7.7)
Neutrophils Relative %: 55 %
Platelet Count: 374 10*3/uL (ref 150–400)
RBC: 3.73 MIL/uL — ABNORMAL LOW (ref 3.87–5.11)
RDW: 13.1 % (ref 11.5–15.5)
WBC Count: 9.5 10*3/uL (ref 4.0–10.5)
nRBC: 0 % (ref 0.0–0.2)

## 2023-02-13 NOTE — Progress Notes (Signed)
Hematology/Oncology Consult note [facility]  [phone] [phone]  Patient Care Team: Marjie Skiff, NP as PCP - General (Nurse Practitioner) End, Cristal Deer, MD as PCP - Cardiology (Cardiology) Lanier Prude, MD as PCP - Electrophysiology (Cardiology) Mertie Moores, MD as Referring Physician (Pulmonary Disease) Cannon Kettle, MD as Referring Physician (Pain Medicine) Bridgett Larsson, LCSW as Social Worker (Licensed Clinical Social Worker) Creig Hines, MD as Consulting Physician (Oncology) Zettie Pho, [facility] (Inactive) (Pharmacist) Kemper Durie, RN as [facility] Care Management   Name of the patient: Jordan Chan  [id number]  1950-08-19   Date of visit: 02/13/23  Diagnosis-neutrophilia likely reactive  Chief complaint/ Reason for visit-routine follow-up of leukocytosis/neutrophilia  Heme/Onc history: patient is a 72 year old female with a past medical history significant for COPD GERD insulin-dependent diabetes history of breast cancer in the remote past who has been referred to [place] for leukocytosis and thrombocytosis.Looking back at her CBC her white cell count fluctuates between 10-16 over the last 7 years.  More recently since the last 3 months her white count has been between 11-13.  There are times when her white count has gone up to 34 and patient thinks that she may have been on steroids.  Differential has mainly showed neutrophilia but also with some monocytosis and mild eosinophilia.  Patient also noted to have a platelet count mainly in the high 400s at least since 2015 with no clear rising trend.  Hemoglobin more recently has been close to 11   Results of blood work from 12/21/2020 were as follows: CBC showed white cell count of 13, H&H of 11.7/37.2 with an MCV of 89 and platelet count of 446.  Iron studies did show evidence of iron deficiency with elevated TIBC of 475, low iron  saturation of 8%And low ferritin of 5 patient received 5 doses of Venofer   BCR ABL FISH testing, JAK2 mutation analysis, flow cytometry wasUnremarkable.    Interval history-patient reports left hip pain and will be undergoing hip replacement surgery next month.  Denies any recurrent infections or hospitalizations.  ECOG PS- 1 Pain scale- 3   Review of systems- Review of Systems  Constitutional:  Negative for chills, fever, malaise/fatigue and weight loss.  HENT:  Negative for congestion, ear discharge and nosebleeds.   Eyes:  Negative for blurred vision.  Respiratory:  Negative for cough, hemoptysis, sputum production, shortness of breath and wheezing.   Cardiovascular:  Negative for chest pain, palpitations, orthopnea and claudication.  Gastrointestinal:  Negative for abdominal pain, blood in stool, constipation, diarrhea, heartburn, melena, nausea and vomiting.  Genitourinary:  Negative for dysuria, flank pain, frequency, hematuria and urgency.  Musculoskeletal:  Positive for joint pain. Negative for back pain and myalgias.  Skin:  Negative for rash.  Neurological:  Negative for dizziness, tingling, focal weakness, seizures, weakness and headaches.  Endo/Heme/Allergies:  Does not bruise/bleed easily.  Psychiatric/Behavioral:  Negative for depression and suicidal ideas. The patient does not have insomnia.       Allergies  Allergen Reactions   Other Palpitations    IV steroids   Pain Patch [Menthol] Anaphylaxis   Avelox [Moxifloxacin Hcl In Nacl] Other (See Comments)    Upset stomach   Doxycycline Diarrhea   Erythromycin Nausea Only and Other (See Comments)    Can take a Z-Pak just fine Other reaction(s): Other (See Comments) Can take Z-Pak  Can take a Z-Pak just fine   Fentanyl Nausea Only and Rash  Moxifloxacin Hcl Other (See Comments)    Upset stomach Upset stomach   Oxycontin [Oxycodone] Hives   Ozempic [Semaglutide] Nausea Only     Past Medical History:   Diagnosis Date   Arthritis    Asthma    Atrial fibrillation (HCC)    Breast cancer (HCC) 1998   right breast ca with mastectomy and chemotherapy and radiation   Bronchitis    CHF (congestive heart failure) (HCC)    "with Morphine withdrawal"   COPD (chronic obstructive pulmonary disease) (HCC)    Diabetes mellitus without complication (HCC)    Diverticulitis    diverticulosis also   Dyspnea    Endometriosis    GERD (gastroesophageal reflux disease)    History of shingles 2000-2005   Hypercholesteremia    Hypertension    IBS (irritable bowel syndrome)    Low back pain    a. Implanted morphine/bupivicaine/clonidine pump.   Neuropathy    Orthopnea    Oxygen dependent    uses at night   Personal history of chemotherapy    Personal history of radiation therapy    Pneumonia    pneumonia 5-6 times, history of bronchitis also   Scoliosis    Sleep apnea    does not use cpap   Stroke (HCC) 2010   TIA, 10 years ago   Withdrawal from sedative drug (HCC)    withdrawal from morphine when pump batteries died     Past Surgical History:  Procedure Laterality Date   ABDOMINAL HYSTERECTOMY  1987   BACK SURGERY     Tailbone removed following fracture   BREAST SURGERY Right    mastectomy   CARDIAC CATHETERIZATION     CATARACT EXTRACTION W/PHACO Right 05/19/2019   Procedure: CATARACT EXTRACTION PHACO AND INTRAOCULAR LENS PLACEMENT (IOC), RIGHT, DIABETIC;  Surgeon: Elliot Cousin, MD;  Location: [facility] ORS;  Service: Ophthalmology;  Laterality: Right;  Lot # [id number] H [place]: 00:43.8 CDE: 4.59   CATARACT EXTRACTION W/PHACO Left 06/16/2019   Procedure: CATARACT EXTRACTION PHACO AND INTRAOCULAR LENS PLACEMENT (IOC) LEFT VISION BLUE DIABETIC;  Surgeon: Elliot Cousin, MD;  Location: [facility] ORS;  Service: Ophthalmology;  Laterality: Left;  Lot #[id number] H [place]: 00:46.9 CDE: 6.53   COCCYX REMOVAL     COLONOSCOPY WITH PROPOFOL N/A 01/01/2017   Procedure: COLONOSCOPY WITH PROPOFOL;  Surgeon: Wyline Mood, MD;  Location: [facility] ENDOSCOPY;  Service: Endoscopy;  Laterality: N/A;   COLONOSCOPY WITH PROPOFOL N/A 12/11/2021   Procedure: COLONOSCOPY WITH PROPOFOL;  Surgeon: Toney Reil, MD;  Location: [facility] ENDOSCOPY;  Service: Gastroenterology;  Laterality: N/A;   ELBOW ARTHROSCOPY WITH TENDON RECONSTRUCTION     ESOPHAGOGASTRODUODENOSCOPY (EGD) WITH PROPOFOL N/A 01/01/2017   Procedure: ESOPHAGOGASTRODUODENOSCOPY (EGD) WITH PROPOFOL;  Surgeon: Wyline Mood, MD;  Location[facility] ENDOSCOPY;  Service: Endoscopy;  Laterality: N/A;   ESOPHAGOGASTRODUODENOSCOPY (EGD) WITH PROPOFOL N/A 12/11/2021   Procedure: ESOPHAGOGASTRODUODENOSCOPY (EGD) WITH PROPOFOL;  Surgeon: Toney Reil, MD;  Location: [facility] ENDOSCOPY;  Service: Gastroenterology;  Laterality: N/A;   INTRATHECAL PUMP IMPLANT     LEFT HEART CATH AND CORONARY ANGIOGRAPHY N/A 04/28/2017   Procedure: LEFT HEART CATH AND CORONARY ANGIOGRAPHY;  Surgeon: Yvonne Kendall, MD;  Location: [facility] INVASIVE CV LAB;  Service: Cardiovascular;  Laterality: N/A;   MASTECTOMY Right 06/1997   morphine pump  2011   PLANTAR FASCIA RELEASE     TRIGGER FINGER RELEASE      Social History   Socioeconomic History   Marital status: Unknown    Spouse name: Not on file  Number of children: Not on file   Years of education: Not on file   Highest education level: High school graduate  Occupational History   Occupation: retired  Tobacco Use   Smoking status: Former    Current packs/day: 0.00    Average packs/day: 1 pack/day for 30.0 years (30.0 ttl pk-yrs)    Types: Cigarettes    Start date: 04/30/1974    Quit date: 04/30/2004    Years since quitting: 18.8   Smokeless tobacco: Never  Vaping Use   Vaping status: Never Used  Substance and Sexual Activity   Alcohol use: No   Drug use: No   Sexual activity: Not Currently    Birth control/protection: Post-menopausal  Other Topics Concern   Not on file  Social History Narrative   ** Merged History  Encounter **       Social Determinants of Health   Financial Resource Strain: Low Risk  (12/23/2022)   Overall Financial Resource Strain (CARDIA)    Difficulty of Paying Living Expenses: Not hard at all  Food Insecurity: No Food Insecurity (12/23/2022)   Hunger Vital Sign    Worried About Running Out of Food in the Last Year: Never true    Ran Out of Food in the Last Year: Never true  Transportation Needs: No Transportation Needs (12/23/2022)   PRAPARE - Administrator, Civil Service (Medical): No    Lack of Transportation (Non-Medical): No  Physical Activity: Inactive (12/23/2022)   Exercise Vital Sign    Days of Exercise per Week: 0 days    Minutes of Exercise per Session: 0 min  Stress: No Stress Concern Present (12/23/2022)   [employer] of Occupational Health - Occupational Stress Questionnaire    Feeling of Stress : Only a little  Social Connections: Moderately Isolated (12/23/2022)   Social Connection and Isolation Panel [NHANES]    Frequency of Communication with Friends and Family: More than three times a week    Frequency of Social Gatherings with Friends and Family: More than three times a week    Attends Religious Services: More than 4 times per year    Active Member of [employer] or Organizations: No    Attends Banker Meetings: Never    Marital Status: Divorced  Catering manager Violence: Not At Risk (12/23/2022)   Humiliation, Afraid, Rape, and Kick questionnaire    Fear of Current or Ex-Partner: No    Emotionally Abused: No    Physically Abused: No    Sexually Abused: No    Family History  Problem Relation Age of Onset   Cancer Mother 37       lung   Thyroid disease Mother    Cancer Father 31       Pancreatic   Hypertension Sister    Cancer Sister        "cancer on face"   Hyperlipidemia Sister    Osteoporosis Maternal Grandmother    Cancer Paternal Grandmother        colon   Arthritis Paternal Grandfather    Hyperlipidemia Son     Seizures Son    Breast cancer Neg Hx      Current Outpatient Medications:    ACCU-CHEK AVIVA PLUS test strip, TEST THREE TIMES DAILY, Disp: 300 strip, Rfl: 3   Accu-Chek Softclix Lancets lancets, TEST BLOOD SUGAR THREE TIMES DAILY, Disp: 300 each, Rfl: 1   albuterol (PROVENTIL HFA;VENTOLIN HFA) 108 (90 BASE) MCG/ACT inhaler, Inhale 2 puffs into the lungs every  6 (six) hours as needed for wheezing or shortness of breath. , Disp: , Rfl:    albuterol (PROVENTIL) (2.5 MG/3ML) 0.083% nebulizer solution, Take 3 mLs (2.5 mg total) by nebulization every 4 (four) hours as needed for wheezing or shortness of breath., Disp: 75 mL, Rfl: 2   Alcohol Swabs (DROPSAFE ALCOHOL PREP) 70 % PADS, USE TWICE DAILY  WITH  SUGAR  CHECKS, Disp: 200 each, Rfl: 2   apixaban (ELIQUIS) 5 MG TABS tablet, Take 1 tablet (5 mg total) by mouth 2 (two) times daily., Disp: 180 tablet, Rfl: 4   atorvastatin (LIPITOR) 40 MG tablet, Take 1 tablet (40 mg total) by mouth daily., Disp: 90 tablet, Rfl: 3   Blood Glucose Monitoring Suppl (TRUE METRIX METER) w/Device KIT, Use to check blood sugar 4 times a day, Disp: 1 kit, Rfl: 4   busPIRone (BUSPAR) 5 MG tablet, Take 1 tablet (5 mg total) by mouth 2 (two) times daily., Disp: 60 tablet, Rfl: 12   cholecalciferol (VITAMIN D3) 25 MCG (1000 UNIT) tablet, Take 1,000 Units by mouth daily., Disp: , Rfl:    Continuous Glucose Receiver (FREESTYLE LIBRE 2 READER) DEVI, To check blood sugars 3-4 times daily due to insulin use. DX E11.40, Disp: 2 each, Rfl: 5   Continuous Glucose Sensor (FREESTYLE LIBRE 2 SENSOR) MISC, To check blood sugars 3-4 times daily due to insulin use. DX E11.40, Disp: 2 each, Rfl: 5   Cyanocobalamin 1000 MCG/ML KIT, Inject 1,000 mcg as directed every 30 (thirty) days., Disp: , Rfl:    cyclobenzaprine (FLEXERIL) 5 MG tablet, Take 5 mg by mouth 2 (two) times daily as needed for muscle spasms. Takes very rarely, Disp: , Rfl:    Dulaglutide (TRULICITY) 4.5 MG/0.5ML SOPN,  Inject 4.5 mg as directed once a week., Disp: 6 mL, Rfl: 4   folic acid (FOLVITE) 1 MG tablet, TAKE 1 TABLET EVERY DAY, Disp: 90 tablet, Rfl: 0   furosemide (LASIX) 40 MG tablet, Take 0.5 tablets (20 mg total) by mouth daily., Disp: 45 tablet, Rfl: 3   gabapentin (NEURONTIN) 800 MG tablet, Take 1 tablet (800 mg total) by mouth 3 (three) times daily., Disp: 270 tablet, Rfl: 4   insulin degludec (TRESIBA FLEXTOUCH) 100 UNIT/ML FlexTouch Pen, Inject 55 Units into the skin at bedtime., Disp: 30 mL, Rfl: 4   insulin lispro (HUMALOG KWIKPEN) 100 UNIT/ML KwikPen, Inject 5 Units into the skin 3 (three) times daily before meals. Do not inject insulin if you are not going to eat meal.  Only take before a meal., Disp: 15 mL, Rfl: 3   losartan (COZAAR) 25 MG tablet, Take 0.5 tablets (12.5 mg total) by mouth daily., Disp: 45 tablet, Rfl: 4   metFORMIN (GLUCOPHAGE) 1000 MG tablet, TAKE 1 TABLET TWICE DAILY WITH MEALS, Disp: 180 tablet, Rfl: 4   metoprolol succinate (TOPROL-XL) 25 MG 24 hr tablet, Take 0.5 tablets (12.5 mg total) by mouth daily., Disp: 45 tablet, Rfl: 4   Nebulizers (EASY AIR COMPRESSOR NEBULIZER) MISC, Use to inhaler nebulizer treatments at needed per instructions on nebulizer prescription, Disp: 1 each, Rfl: 0   ondansetron (ZOFRAN) 4 MG tablet, Take 1 tablet (4 mg total) by mouth every 8 (eight) hours as needed for nausea or vomiting., Disp: 45 tablet, Rfl: 0   PAIN MANAGEMENT INTRATHECAL, IT, PUMP, 1 each by Intrathecal route. Intrathecal (IT) medication:  Morphine Patient does not remember current. Adjusted every 2.5 months at [facility] in [place]., Disp: , Rfl:  pantoprazole (PROTONIX) 40 MG tablet, Take 1 tablet (40 mg total) by mouth daily., Disp: 90 tablet, Rfl: 4   venlafaxine XR (EFFEXOR-XR) 150 MG 24 hr capsule, Take 1 capsule (150 mg total) by mouth daily., Disp: 90 capsule, Rfl: 4   Fluticasone-Umeclidin-Vilant (TRELEGY ELLIPTA) 100-62.5-25 MCG/ACT AEPB, INHALE 1  PUFF INTO THE LUNGS DAILY -42 DAY EXPIRATION AFTER OPENING FOIL TRAY, Disp: 180 each, Rfl: 2   montelukast (SINGULAIR) 10 MG tablet, Take 1 tablet (10 mg total) by mouth at bedtime. (Patient not taking: Reported on 02/13/2023), Disp: 90 tablet, Rfl: 4  Current Facility-Administered Medications:    cyanocobalamin (VITAMIN B12) injection 1,000 mcg, 1,000 mcg, Intramuscular, Q30 days, [name], Jolene T, NP, 1,000 mcg at 02/12/23 1008  Physical exam:  Vitals:   02/13/23 1016  BP: (!) 111/40  Pulse: 81  Resp: 16  Temp: 97.6 F (36.4 C)  SpO2: 93%  Weight: 146 lb 11.2 oz (66.5 kg)   Physical Exam Cardiovascular:     Rate and Rhythm: Normal rate and regular rhythm.     Heart sounds: Normal heart sounds.  Pulmonary:     Effort: Pulmonary effort is normal.     Breath sounds: Normal breath sounds.  Skin:    General: Skin is warm and dry.  Neurological:     Mental Status: She is alert and oriented to person, place, and time.         Latest Ref Rng & Units 12/23/2022    9:38 AM  CMP  Glucose 70 - 99 mg/dL 79   BUN 8 - 27 mg/dL 15   Creatinine 1.61 - 1.00 mg/dL 0.96   Sodium 045 - 409 mmol/L 136   Potassium 3.5 - 5.2 mmol/L 5.1   Chloride 96 - 106 mmol/L 95   CO2 20 - 29 mmol/L 29   Calcium 8.7 - 10.3 mg/dL 9.6       Latest Ref Rng & Units 02/13/2023    9:51 AM  CBC  WBC 4.0 - 10.5 K/uL 9.5   Hemoglobin 12.0 - 15.0 g/dL 81.1   Hematocrit 91.4 - 46.0 % 34.2   Platelets 150 - 400 K/uL 374      Assessment and plan- Patient is a 72 y.o. female who is here for follow-up of leukocytosis/neutrophilia.  Patient has waxing and waning leukocytosis with a white count that fluctuates between 8-16 without a clear rising trend.  Today her white count is normal with a white count of 9.5 and a normal differential.  Hemoglobin has remained stable between 11-12 and platelet counts are normal.  Flow cytometry and BCR-ABL as well as JAK2 testing in the past has been negative.  She does not  require any follow-up with me at this time and can be referred to [place] in the future if questions or concerns arise   Visit Diagnosis 1. Neutrophilia      Dr. Owens Shark, MD, MPH [facility] at [facility] [phone] 02/13/2023 2:42 PM

## 2023-02-13 NOTE — Telephone Encounter (Signed)
Change of pharmacy  Requested Prescriptions  Pending Prescriptions Disp Refills   Fluticasone-Umeclidin-Vilant (TRELEGY ELLIPTA) 100-62.5-25 MCG/ACT AEPB [Pharmacy Med Name: TRELEGY ELLIPTA 100-62.5-25 MCG/ACT Aerosol Powder Breath Activated] 180 each 2    Sig: INHALE 1 PUFF INTO THE LUNGS DAILY -42 DAY EXPIRATION AFTER OPENING FOIL TRAY     Off-Protocol Failed - 02/13/2023  2:24 AM      Failed - Medication not assigned to a protocol, review manually.      Passed - Valid encounter within last 12 months    Recent Outpatient Visits           1 month ago Insulin dependent type 2 diabetes mellitus (HCC)   [place] [facility] [place], [place] T, NP   1 month ago Insulin dependent type 2 diabetes mellitus (HCC)   [place] [facility] [place], Corrie Dandy T, NP   2 months ago Severe sepsis (HCC)   [place] [facility] [place], Corrie Dandy T, NP   4 months ago Insulin dependent type 2 diabetes mellitus (HCC)   [place] [facility] [place], Corrie Dandy T, NP   6 months ago COPD with acute exacerbation (HCC)   [place] [facility] Mecum, Oswaldo Conroy, PA-C       Future Appointments             In 4 days [name], Dorie Rank, NP [place] [facility]   In 3 weeks [place], Dorie Rank, NP [place] [facility]   In 1 month [place], Conway T, NP [place] [facility]   In 2 months End, Cristal Deer, MD [facility]

## 2023-02-14 NOTE — Patient Instructions (Signed)
Be Involved in Caring For Your Health:  Taking Medications When medications are taken as directed, they can greatly improve your health. But if they are not taken as prescribed, they may not work. In some cases, not taking them correctly can be harmful. To help ensure your treatment remains effective and safe, understand your medications and how to take them. Bring your medications to each visit for review by your provider.  Your lab results, notes, and after visit summary will be available on My Chart. We strongly encourage you to use this feature. If lab results are abnormal the clinic will contact you with the appropriate steps. If the clinic does not contact you assume the results are satisfactory. You can always view your results on My Chart. If you have questions regarding your health or results, please contact the clinic during office hours. You can also ask questions on My Chart.  We at [facility] are grateful that you chose us to provide your care. We strive to provide evidence-based and compassionate care and are always looking for feedback. If you get a survey from the clinic please complete this so we can hear your opinions.  Diabetes Mellitus Basics  Diabetes mellitus, or diabetes, is a long-term (chronic) disease. It occurs when the body does not properly use sugar (glucose) that is released from food after you eat. Diabetes mellitus may be caused by one or both of these problems: Your pancreas does not make enough of a hormone called insulin. Your body does not react in a normal way to the insulin that it makes. Insulin lets glucose enter cells in your body. This gives you energy. If you have diabetes, glucose cannot get into cells. This causes high blood glucose (hyperglycemia). How to treat and manage diabetes You may need to take insulin or other diabetes medicines daily to keep your glucose in balance. If you are prescribed insulin, you will learn how to give  yourself insulin by injection. You may need to adjust the amount of insulin you take based on the foods that you eat. You will need to check your blood glucose levels using a glucose monitor as told by your health care provider. The readings can help determine if you have low or high blood glucose. Generally, you should have these blood glucose levels: Before meals (preprandial): 80-130 mg/dL (4.4-7.2 mmol/L). After meals (postprandial): below 180 mg/dL (10 mmol/L). Hemoglobin A1c (HbA1c) level: less than 7%. Your health care provider will set treatment goals for you. Keep all follow-up visits. This is important. Follow these instructions at home: Diabetes medicines Take your diabetes medicines every day as told by your health care provider. List your diabetes medicines here: Name of medicine: ______________________________ Amount (dose): _______________ Time (a.m./p.m.): _______________ Notes: ___________________________________ Name of medicine: ______________________________ Amount (dose): _______________ Time (a.m./p.m.): _______________ Notes: ___________________________________ Name of medicine: ______________________________ Amount (dose): _______________ Time (a.m./p.m.): _______________ Notes: ___________________________________ Insulin If you use insulin, list the types of insulin you use here: Insulin type: ______________________________ Amount (dose): _______________ Time (a.m./p.m.): _______________Notes: ___________________________________ Insulin type: ______________________________ Amount (dose): _______________ Time (a.m./p.m.): _______________ Notes: ___________________________________ Insulin type: ______________________________ Amount (dose): _______________ Time (a.m./p.m.): _______________ Notes: ___________________________________ Insulin type: ______________________________ Amount (dose): _______________ Time (a.m./p.m.): _______________ Notes:  ___________________________________ Insulin type: ______________________________ Amount (dose): _______________ Time (a.m./p.m.): _______________ Notes: ___________________________________ Managing blood glucose  Check your blood glucose levels using a glucose monitor as told by your health care provider. Write down the times that you check your glucose levels here: Time: _______________ Notes: ___________________________________   Time: _______________ Notes: ___________________________________ Time: _______________ Notes: ___________________________________ Time: _______________ Notes: ___________________________________ Time: _______________ Notes: ___________________________________ Time: _______________ Notes: ___________________________________  Low blood glucose Low blood glucose (hypoglycemia) is when glucose is at or below 70 mg/dL (3.9 mmol/L). Symptoms may include: Feeling: Hungry. Sweaty and clammy. Irritable or easily upset. Dizzy. Sleepy. Having: A fast heartbeat. A headache. A change in your vision. Numbness around the mouth, lips, or tongue. Having trouble with: Moving (coordination). Sleeping. Treating low blood glucose To treat low blood glucose, eat or drink something containing sugar right away. If you can think clearly and swallow safely, follow the 15:15 rule: Take 15 grams of a fast-acting carb (carbohydrate), as told by your health care provider. Some fast-acting carbs are: Glucose tablets: take 3-4 tablets. Hard candy: eat 3-5 pieces. Fruit juice: drink 4 oz (120 mL). Regular (not diet) soda: drink 4-6 oz (120-180 mL). Honey or sugar: eat 1 Tbsp (15 mL). Check your blood glucose levels 15 minutes after you take the carb. If your glucose is still at or below 70 mg/dL (3.9 mmol/L), take 15 grams of a carb again. If your glucose does not go above 70 mg/dL (3.9 mmol/L) after 3 tries, get help right away. After your glucose goes back to normal, eat a meal  or a snack within 1 hour. Treating very low blood glucose If your glucose is at or below 54 mg/dL (3 mmol/L), you have very low blood glucose (severe hypoglycemia). This is an emergency. Do not wait to see if the symptoms will go away. Get medical help right away. Call your local emergency services (911 in the [place] Do not drive yourself to the hospital. Questions to ask your health care provider Should I talk with a diabetes educator? What equipment will I need to care for myself at home? What diabetes medicines do I need? When should I take them? How often do I need to check my blood glucose levels? What number can I call if I have questions? When is my follow-up visit? Where can I find a support group for people with diabetes? Where to find more information American Diabetes Association: www.diabetes.org Association of Diabetes Care and Education Specialists: www.diabeteseducator.org Contact a health care provider if: Your blood glucose is at or above 240 mg/dL (13.3 mmol/L) for 2 days in a row. You have been sick or have had a fever for 2 days or more, and you are not getting better. You have any of these problems for more than 6 hours: You cannot eat or drink. You feel nauseous. You vomit. You have diarrhea. Get help right away if: Your blood glucose is lower than 54 mg/dL (3 mmol/L). You get confused. You have trouble thinking clearly. You have trouble breathing. These symptoms may represent a serious problem that is an emergency. Do not wait to see if the symptoms will go away. Get medical help right away. Call your local emergency services (911 in the [place] Do not drive yourself to the hospital. Summary Diabetes mellitus is a chronic disease that occurs when the body does not properly use sugar (glucose) that is released from food after you eat. Take insulin and diabetes medicines as told. Check your blood glucose every day, as often as told. Keep all follow-up visits. This  is important. This information is not intended to replace advice given to you by your health care provider. Make sure you discuss any questions you have with your health care provider. Document Revised: 11/22/2019 Document Reviewed: 11/22/2019 Elsevier Patient Education    2024 Elsevier Inc.  

## 2023-02-17 ENCOUNTER — Encounter: Payer: Self-pay | Admitting: Nurse Practitioner

## 2023-02-17 ENCOUNTER — Ambulatory Visit: Payer: Medicare HMO | Admitting: Nurse Practitioner

## 2023-02-17 VITALS — BP 137/74 | HR 80 | Temp 97.7°F | Ht 58.5 in | Wt 149.2 lb

## 2023-02-17 DIAGNOSIS — Z794 Long term (current) use of insulin: Secondary | ICD-10-CM | POA: Diagnosis not present

## 2023-02-17 DIAGNOSIS — E119 Type 2 diabetes mellitus without complications: Secondary | ICD-10-CM

## 2023-02-17 DIAGNOSIS — B354 Tinea corporis: Secondary | ICD-10-CM

## 2023-02-17 DIAGNOSIS — B359 Dermatophytosis, unspecified: Secondary | ICD-10-CM | POA: Diagnosis not present

## 2023-02-17 LAB — URINALYSIS, ROUTINE W REFLEX MICROSCOPIC
Bilirubin, UA: NEGATIVE
Glucose, UA: NEGATIVE
Nitrite, UA: NEGATIVE
Protein,UA: NEGATIVE
RBC, UA: NEGATIVE
Specific Gravity, UA: 1.015 (ref 1.005–1.030)
Urobilinogen, Ur: 0.2 mg/dL (ref 0.2–1.0)
pH, UA: 5.5 (ref 5.0–7.5)

## 2023-02-17 LAB — MICROSCOPIC EXAMINATION: Bacteria, UA: NONE SEEN

## 2023-02-17 LAB — BAYER DCA HB A1C WAIVED: HB A1C (BAYER DCA - WAIVED): 7.5 % — ABNORMAL HIGH (ref 4.8–5.6)

## 2023-02-17 MED ORDER — NYSTATIN 100000 UNIT/GM EX POWD: 1.0000 | Freq: Three times a day (TID) | CUTANEOUS | 1 refills | Status: DC

## 2023-02-17 NOTE — Progress Notes (Signed)
BP 137/74   Pulse 80   Temp 97.7 F (36.5 C) (Oral)   Ht 4' 10.5" (1.486 m)   Wt 149 lb 3.2 oz (67.7 kg)   LMP  (LMP Unknown)   SpO2 100%   BMI 30.65 kg/m    Subjective:    Patient ID: [name], female    DOB: June 08, 1951, 72 y.o.   MRN: [id number]  HPI: KALEEYA HANCOCK is a 72 y.o. female  Chief Complaint  Patient presents with   Diabetes    A1C check    DIABETES A1c June 8.1%. Continues on Metformin 1000 MG BID, Trulicity 4.5 MG weekly, Tresiba 55 units at HS, meal time insulin. Currently taking 2-3 units insulin before meals. In a lot of pain with hip and would like A1c checked today.  Has had steroid injections in past which offered minimal benefit  to hip pain (lasted 4 days) -- was scheduled for surgery 01/08/23, but A1c was above goal.  Does follow with pain clinic [facility] and last saw 01/07/23.   Jardiance made her sick in past and Mounjaro gave her severe diarrhea and abdominal pain.  Works with [employer] team and provider.   Using Freestyle at this time -- over past 90 days = average glucose 135, in target 87% of the time, above 12%, low 1%. Hypoglycemic episodes:yes, a couple in the 60's Polydipsia/polyuria: no Visual disturbance: no Chest pain: no Paresthesias: no Glucose Monitoring: yes  Accucheck frequency: Daily  Fasting glucose: 90 to 100 range  Post prandial: today 141   Evening:  Before meals: Taking Insulin?: yes  Long acting insulin: 55 units  Short acting insulin: 2-7 units meal time (often less then 6) Blood Pressure Monitoring: daily Retinal Examination: Up to Date Foot Exam: Up to Date Pneumovax: Up to Date Influenza: Up to Date Aspirin: no   Relevant past medical, surgical, family and social history reviewed and updated as indicated. Interim medical history since our last visit reviewed. Allergies and medications reviewed and updated.  Review of Systems  Constitutional:  Negative for activity change, appetite change, diaphoresis,  fatigue and fever.  Respiratory:  Positive for shortness of breath (baseline). Negative for cough, chest tightness and wheezing.   Cardiovascular:  Negative for chest pain, palpitations and leg swelling.  Gastrointestinal: Negative.   Endocrine: Negative for heat intolerance, polydipsia, polyphagia and polyuria.  Musculoskeletal:  Positive for arthralgias (hip).  Neurological: Negative.   Psychiatric/Behavioral: Negative.     Per HPI unless specifically indicated above     Objective:    BP 137/74   Pulse 80   Temp 97.7 F (36.5 C) (Oral)   Ht 4' 10.5" (1.486 m)   Wt 149 lb 3.2 oz (67.7 kg)   LMP  (LMP Unknown)   SpO2 100%   BMI 30.65 kg/m   Wt Readings from Last 3 Encounters:  02/17/23 149 lb 3.2 oz (67.7 kg)  02/13/23 146 lb 11.2 oz (66.5 kg)  01/14/23 148 lb 6.4 oz (67.3 kg)    Physical Exam Vitals and nursing note reviewed.  Constitutional:      General: She is awake. She is not in acute distress.    Appearance: She is well-developed and well-groomed. She is not ill-appearing.  HENT:     Head: Normocephalic.     Right Ear: Hearing normal.     Left Ear: Hearing normal.  Eyes:     General: Lids are normal.        Right eye: No discharge.  Left eye: No discharge.     Conjunctiva/sclera: Conjunctivae normal.     Pupils: Pupils are equal, round, and reactive to light.  Neck:     Thyroid: No thyromegaly.     Vascular: No carotid bruit.  Cardiovascular:     Rate and Rhythm: Normal rate and regular rhythm.     Heart sounds: Normal heart sounds. No murmur heard.    No gallop.  Pulmonary:     Effort: Pulmonary effort is normal. No accessory muscle usage or respiratory distress.     Breath sounds: Normal breath sounds. No decreased breath sounds, wheezing or rhonchi.  Abdominal:     General: Bowel sounds are normal.     Palpations: Abdomen is soft.  Musculoskeletal:     Cervical back: Normal range of motion and neck supple.     Right lower leg: No edema.      Left lower leg: No edema.  Lymphadenopathy:     Cervical: No cervical adenopathy.  Skin:    General: Skin is warm and dry.     Findings: Rash present.     Comments: Under both breasts, shiny red and scattered.  Neurological:     Mental Status: She is alert and oriented to person, place, and time.  Psychiatric:        Attention and Perception: Attention normal.        Mood and Affect: Mood normal. Affect is tearful.        Speech: Speech normal.        Behavior: Behavior normal. Behavior is cooperative.        Thought Content: Thought content normal.     Comments: Very teary-eyed and crying due to pain.    Results for orders placed or performed in visit on 02/13/23  CBC with Differential (Cancer Center Only)  Result Value Ref Range   WBC Count 9.5 4.0 - 10.5 K/uL   RBC 3.73 (L) 3.87 - 5.11 MIL/uL   Hemoglobin 11.2 (L) 12.0 - 15.0 g/dL   HCT 40.9 (L) 81.1 - 91.4 %   MCV 91.7 80.0 - 100.0 fL   MCH 30.0 26.0 - 34.0 pg   MCHC 32.7 30.0 - 36.0 g/dL   RDW 78.2 95.6 - 21.3 %   Platelet Count 374 150 - 400 K/uL   nRBC 0.0 0.0 - 0.2 %   Neutrophils Relative % 55 %   Neutro Abs 5.2 1.7 - 7.7 K/uL   Lymphocytes Relative 30 %   Lymphs Abs 2.9 0.7 - 4.0 K/uL   Monocytes Relative 9 %   Monocytes Absolute 0.9 0.1 - 1.0 K/uL   Eosinophils Relative 5 %   Eosinophils Absolute 0.4 0.0 - 0.5 K/uL   Basophils Relative 1 %   Basophils Absolute 0.1 0.0 - 0.1 K/uL   Immature Granulocytes 0 %   Abs Immature Granulocytes 0.02 0.00 - 0.07 K/uL      Assessment & Plan:   Problem List Items Addressed This Visit       Endocrine   Insulin dependent type 2 diabetes mellitus (HCC) - Primary    Chronic, ongoing, insulin dependent. A1c 7.5% today, was previous 8.1% in June and 9.2% on 12/11/22.  Discussed at length with her today.  Will alert ortho and fill out pre-op, saw cardiology in May and pre-op done there with EKG.  She is having trend down in blood sugar with addition of meal time insulin at  this time and Freestyle showing excellent readings. - Will continue  Metformin at max dosing, Trulicity 4.5 MG weekly, and Tresiba 55 units, increase 3 units in 3 days if fasting sugar not less then 130 consistently recommended.  Continue meal time insulin, 2-5 units prior to meals and will increase as needed.  Educated her on this.  Reviewed CCM team pharmacist note for further recommendations.   - Freestyle placed in office today with PCP assistance. - Recommend she continue to monitor BS consistently at home and document + focus heavily on diet changes.  She is aware to notify provider if fasting BS >130 consistently or <70, as may need to adjust insulin further.   - In past poorly tolerated Jardiance and Mounjaro -- discussed with her that and how SGLT2 would benefit HF -- however concern for side effects.   - Return in August for A1c check.       Relevant Orders   Bayer DCA Hb A1c Waived   Basic metabolic panel   Urinalysis, Routine w reflex microscopic   Other Visit Diagnoses     Tinea corporis       Nystatin powder sent in.   Relevant Medications   nystatin (MYCOSTATIN/NYSTOP) powder        Follow up plan: Return in about 1 month (around 03/23/2023) for T2DM, HTN/HLD, COPD, MOOD.

## 2023-02-17 NOTE — Assessment & Plan Note (Signed)
Chronic, ongoing, insulin dependent. A1c 7.5% today, was previous 8.1% in June and 9.2% on 12/11/22.  Discussed at length with her today.  Will alert ortho and fill out pre-op, saw cardiology in May and pre-op done there with EKG.  She is having trend down in blood sugar with addition of meal time insulin at this time and Freestyle showing excellent readings. - Will continue Metformin at max dosing, Trulicity 4.5 MG weekly, and Tresiba 55 units, increase 3 units in 3 days if fasting sugar not less then 130 consistently recommended.  Continue meal time insulin, 2-5 units prior to meals and will increase as needed.  Educated her on this.  Reviewed CCM team pharmacist note for further recommendations.   - Freestyle placed in office today with PCP assistance. - Recommend she continue to monitor BS consistently at home and document + focus heavily on diet changes.  She is aware to notify provider if fasting BS >130 consistently or <70, as may need to adjust insulin further.   - In past poorly tolerated Jardiance and Mounjaro -- discussed with her that and how SGLT2 would benefit HF -- however concern for side effects.   - Return in August for A1c check.

## 2023-02-18 LAB — BASIC METABOLIC PANEL
BUN/Creatinine Ratio: 12 (ref 12–28)
BUN: 14 mg/dL (ref 8–27)
CO2: 23 mmol/L (ref 20–29)
Calcium: 9.4 mg/dL (ref 8.7–10.3)
Chloride: 97 mmol/L (ref 96–106)
Creatinine, Ser: 1.17 mg/dL — ABNORMAL HIGH (ref 0.57–1.00)
Glucose: 74 mg/dL (ref 70–99)
Potassium: 4.3 mmol/L (ref 3.5–5.2)
Sodium: 137 mmol/L (ref 134–144)
eGFR: 50 mL/min/{1.73_m2} — ABNORMAL LOW (ref >59)

## 2023-02-18 MED ORDER — NYSTATIN 100000 UNIT/GM EX CREA: 1.0000 | TOPICAL_CREAM | Freq: Two times a day (BID) | CUTANEOUS | 1 refills | Status: DC

## 2023-02-18 NOTE — Addendum Note (Signed)
Addended by: Aura Dials T on: 02/18/2023 07:42 PM   Modules accepted: Orders

## 2023-02-18 NOTE — Progress Notes (Signed)
Contacted via MyChart   Good morning Amarii, your labs have returned and still some mild kidney disease present.  We will monitor this closely at visits.  Any questions? Keep being wonderful!!  Thank you for allowing me to participate in your care.  I appreciate you. Kindest regards, Jaspal Pultz

## 2023-02-24 ENCOUNTER — Other Ambulatory Visit: Payer: Self-pay | Admitting: Nurse Practitioner

## 2023-02-25 ENCOUNTER — Ambulatory Visit: Payer: Self-pay | Admitting: *Deleted

## 2023-02-25 NOTE — Telephone Encounter (Signed)
Requested Prescriptions  Pending Prescriptions Disp Refills   pantoprazole (PROTONIX) 40 MG tablet [Pharmacy Med Name: Pantoprazole Sodium 40 MG Oral Tablet Delayed Release] 90 tablet 0    Sig: Take 1 tablet by mouth once daily     Gastroenterology: Proton Pump Inhibitors Passed - 02/24/2023  6:40 AM      Passed - Valid encounter within last 12 months    Recent Outpatient Visits           1 week ago Insulin dependent type 2 diabetes mellitus (HCC)   [place] [facility] [place], [place] T, NP   1 month ago Insulin dependent type 2 diabetes mellitus (HCC)   [place] [facility] [place], Linda T, NP   2 months ago Insulin dependent type 2 diabetes mellitus (HCC)   [place] [facility] [place], Corrie Dandy T, NP   2 months ago Severe sepsis (HCC)   [place] [facility] [place], [place] T, NP   5 months ago Insulin dependent type 2 diabetes mellitus (HCC)   [place] [facility] [place], Dorie Rank, NP       Future Appointments             In 2 weeks [name], Dorie Rank, NP [facility]   In 4 weeks [place], Dorie Rank, NP [place] [facility], [facility]   In 2 months [place], Cristal Deer, MD [facility]

## 2023-02-25 NOTE — Patient Outreach (Signed)
  Care Coordination   Follow Up Visit Note   02/25/2023 Name: Jordan Chan MRN: [id number] DOB: 1950-12-01  [name] is a 72 y.o. year old female who sees Aura Dials T, NP for primary care. I spoke with  [name] by phone today.  What matters to the patients health and wellness today?  Have hip surgery.  State she will look into another provider if current does not accept the A1C of 7.5     Goals Addressed             This Visit's Progress    Decrease A1C in effort to have hip surgery   On track    Care Coordination Interventions: Provided education to patient about basic DM disease process Reviewed medications with patient and discussed importance of medication adherence Counseled on importance of regular laboratory monitoring as prescribed Discussed plans with patient for ongoing care management follow up and provided patient with direct contact information for care management team Provided patient with written educational materials related to hypo and hyperglycemia and importance of correct treatment         SDOH assessments and interventions completed:  No     Care Coordination Interventions:  Yes, provided   Interventions Today    Flowsheet Row Most Recent Value  Chronic Disease   Chronic disease during today's visit Diabetes, Other  [prep for hip surgery]  General Interventions   General Interventions Discussed/Reviewed Labs, General Interventions Reviewed, Doctor Visits  Labs Hgb A1c every 3 months  [A1C now down to 7.5]  Doctor Visits Discussed/Reviewed Doctor Visits Reviewed, Specialist  [She will call ortho office today to report decrease in A1C and get surgery date moved up from 8/29 to earlier.  PCP visit scheduled for 8/7]  PCP/Specialist Visits Compliance with follow-up visit  Education Interventions   Education Provided Provided Education  Provided Verbal Education On Nutrition, Blood Sugar Monitoring, When to see the  doctor  [Encouraged to continue current diabetic diet and glucose monitoring]       Follow up plan: Follow up call scheduled for 9/3    Encounter Outcome:  Pt. Visit Completed   Kemper Durie, RN, MSN, [facility] Care Management Care Management Coordinator [phone]

## 2023-02-26 ENCOUNTER — Other Ambulatory Visit: Payer: Self-pay | Admitting: Nurse Practitioner

## 2023-02-26 NOTE — Telephone Encounter (Signed)
Requested Prescriptions  Pending Prescriptions Disp Refills   Accu-Chek Softclix Lancets lancets [Pharmacy Med Name: ACCU-CHEK SOFTCLIX LANCETS] 300 each 3    Sig: TEST BLOOD SUGAR THREE TIMES DAILY     Endocrinology: Diabetes - Testing Supplies Passed - 02/26/2023  2:29 AM      Passed - Valid encounter within last 12 months    Recent Outpatient Visits           1 week ago Insulin dependent type 2 diabetes mellitus (HCC)   [place] [facility] [place], [place] T, NP   1 month ago Insulin dependent type 2 diabetes mellitus (HCC)   [facility] [place], [place] T, NP   2 months ago Insulin dependent type 2 diabetes mellitus (HCC)   [place] [facility] [place], Corrie Dandy T, NP   2 months ago Severe sepsis (HCC)   [place] [facility] [place], [place] T, NP   5 months ago Insulin dependent type 2 diabetes mellitus (HCC)   [place] [facility] [place], Dorie Rank, NP       Future Appointments             In 1 week [name], Dorie Rank, NP [place] [facility], [facility]   In 3 weeks [place], Dorie Rank, NP [place] [facility], [facility]   In 2 months End, Cristal Deer, MD [facility]

## 2023-03-03 ENCOUNTER — Ambulatory Visit: Payer: Medicare HMO

## 2023-03-03 NOTE — Progress Notes (Signed)
Patient presented to the office to have a new sensor for her freestyle libre placed. New sensor provided by the patient. New sensor placed on the L arm of the patient and patient tolerated well.

## 2023-03-05 NOTE — Patient Instructions (Signed)
Be Involved in Caring For Your Health:  Taking Medications When medications are taken as directed, they can greatly improve your health. But if they are not taken as prescribed, they may not work. In some cases, not taking them correctly can be harmful. To help ensure your treatment remains effective and safe, understand your medications and how to take them. Bring your medications to each visit for review by your provider.  Your lab results, notes, and after visit summary will be available on My Chart. We strongly encourage you to use this feature. If lab results are abnormal the clinic will contact you with the appropriate steps. If the clinic does not contact you assume the results are satisfactory. You can always view your results on My Chart. If you have questions regarding your health or results, please contact the clinic during office hours. You can also ask questions on My Chart.  We at [facility] are grateful that you chose us to provide your care. We strive to provide evidence-based and compassionate care and are always looking for feedback. If you get a survey from the clinic please complete this so we can hear your opinions.  Diabetes Mellitus Basics  Diabetes mellitus, or diabetes, is a long-term (chronic) disease. It occurs when the body does not properly use sugar (glucose) that is released from food after you eat. Diabetes mellitus may be caused by one or both of these problems: Your pancreas does not make enough of a hormone called insulin. Your body does not react in a normal way to the insulin that it makes. Insulin lets glucose enter cells in your body. This gives you energy. If you have diabetes, glucose cannot get into cells. This causes high blood glucose (hyperglycemia). How to treat and manage diabetes You may need to take insulin or other diabetes medicines daily to keep your glucose in balance. If you are prescribed insulin, you will learn how to give  yourself insulin by injection. You may need to adjust the amount of insulin you take based on the foods that you eat. You will need to check your blood glucose levels using a glucose monitor as told by your health care provider. The readings can help determine if you have low or high blood glucose. Generally, you should have these blood glucose levels: Before meals (preprandial): 80-130 mg/dL (4.4-7.2 mmol/L). After meals (postprandial): below 180 mg/dL (10 mmol/L). Hemoglobin A1c (HbA1c) level: less than 7%. Your health care provider will set treatment goals for you. Keep all follow-up visits. This is important. Follow these instructions at home: Diabetes medicines Take your diabetes medicines every day as told by your health care provider. List your diabetes medicines here: Name of medicine: ______________________________ Amount (dose): _______________ Time (a.m./p.m.): _______________ Notes: ___________________________________ Name of medicine: ______________________________ Amount (dose): _______________ Time (a.m./p.m.): _______________ Notes: ___________________________________ Name of medicine: ______________________________ Amount (dose): _______________ Time (a.m./p.m.): _______________ Notes: ___________________________________ Insulin If you use insulin, list the types of insulin you use here: Insulin type: ______________________________ Amount (dose): _______________ Time (a.m./p.m.): _______________Notes: ___________________________________ Insulin type: ______________________________ Amount (dose): _______________ Time (a.m./p.m.): _______________ Notes: ___________________________________ Insulin type: ______________________________ Amount (dose): _______________ Time (a.m./p.m.): _______________ Notes: ___________________________________ Insulin type: ______________________________ Amount (dose): _______________ Time (a.m./p.m.): _______________ Notes:  ___________________________________ Insulin type: ______________________________ Amount (dose): _______________ Time (a.m./p.m.): _______________ Notes: ___________________________________ Managing blood glucose  Check your blood glucose levels using a glucose monitor as told by your health care provider. Write down the times that you check your glucose levels here: Time: _______________ Notes: ___________________________________   Time: _______________ Notes: ___________________________________ Time: _______________ Notes: ___________________________________ Time: _______________ Notes: ___________________________________ Time: _______________ Notes: ___________________________________ Time: _______________ Notes: ___________________________________  Low blood glucose Low blood glucose (hypoglycemia) is when glucose is at or below 70 mg/dL (3.9 mmol/L). Symptoms may include: Feeling: Hungry. Sweaty and clammy. Irritable or easily upset. Dizzy. Sleepy. Having: A fast heartbeat. A headache. A change in your vision. Numbness around the mouth, lips, or tongue. Having trouble with: Moving (coordination). Sleeping. Treating low blood glucose To treat low blood glucose, eat or drink something containing sugar right away. If you can think clearly and swallow safely, follow the 15:15 rule: Take 15 grams of a fast-acting carb (carbohydrate), as told by your health care provider. Some fast-acting carbs are: Glucose tablets: take 3-4 tablets. Hard candy: eat 3-5 pieces. Fruit juice: drink 4 oz (120 mL). Regular (not diet) soda: drink 4-6 oz (120-180 mL). Honey or sugar: eat 1 Tbsp (15 mL). Check your blood glucose levels 15 minutes after you take the carb. If your glucose is still at or below 70 mg/dL (3.9 mmol/L), take 15 grams of a carb again. If your glucose does not go above 70 mg/dL (3.9 mmol/L) after 3 tries, get help right away. After your glucose goes back to normal, eat a meal  or a snack within 1 hour. Treating very low blood glucose If your glucose is at or below 54 mg/dL (3 mmol/L), you have very low blood glucose (severe hypoglycemia). This is an emergency. Do not wait to see if the symptoms will go away. Get medical help right away. Call your local emergency services (911 in the [place] Do not drive yourself to the hospital. Questions to ask your health care provider Should I talk with a diabetes educator? What equipment will I need to care for myself at home? What diabetes medicines do I need? When should I take them? How often do I need to check my blood glucose levels? What number can I call if I have questions? When is my follow-up visit? Where can I find a support group for people with diabetes? Where to find more information American Diabetes Association: www.diabetes.org Association of Diabetes Care and Education Specialists: www.diabeteseducator.org Contact a health care provider if: Your blood glucose is at or above 240 mg/dL (13.3 mmol/L) for 2 days in a row. You have been sick or have had a fever for 2 days or more, and you are not getting better. You have any of these problems for more than 6 hours: You cannot eat or drink. You feel nauseous. You vomit. You have diarrhea. Get help right away if: Your blood glucose is lower than 54 mg/dL (3 mmol/L). You get confused. You have trouble thinking clearly. You have trouble breathing. These symptoms may represent a serious problem that is an emergency. Do not wait to see if the symptoms will go away. Get medical help right away. Call your local emergency services (911 in the [place] Do not drive yourself to the hospital. Summary Diabetes mellitus is a chronic disease that occurs when the body does not properly use sugar (glucose) that is released from food after you eat. Take insulin and diabetes medicines as told. Check your blood glucose every day, as often as told. Keep all follow-up visits. This  is important. This information is not intended to replace advice given to you by your health care provider. Make sure you discuss any questions you have with your health care provider. Document Revised: 11/22/2019 Document Reviewed: 11/22/2019 Elsevier Patient Education    2024 Elsevier Inc.  

## 2023-03-09 ENCOUNTER — Other Ambulatory Visit: Payer: Self-pay | Admitting: Nurse Practitioner

## 2023-03-09 DIAGNOSIS — Z1231 Encounter for screening mammogram for malignant neoplasm of breast: Secondary | ICD-10-CM

## 2023-03-11 ENCOUNTER — Encounter: Payer: Self-pay | Admitting: Nurse Practitioner

## 2023-03-11 ENCOUNTER — Ambulatory Visit (INDEPENDENT_AMBULATORY_CARE_PROVIDER_SITE_OTHER): Payer: Medicare HMO | Admitting: Nurse Practitioner

## 2023-03-11 VITALS — BP 110/67 | HR 93 | Temp 97.9°F | Ht 58.5 in | Wt 143.4 lb

## 2023-03-11 DIAGNOSIS — G894 Chronic pain syndrome: Secondary | ICD-10-CM | POA: Diagnosis not present

## 2023-03-11 DIAGNOSIS — E1159 Type 2 diabetes mellitus with other circulatory complications: Secondary | ICD-10-CM

## 2023-03-11 DIAGNOSIS — J449 Chronic obstructive pulmonary disease, unspecified: Secondary | ICD-10-CM | POA: Diagnosis not present

## 2023-03-11 DIAGNOSIS — E119 Type 2 diabetes mellitus without complications: Secondary | ICD-10-CM

## 2023-03-11 DIAGNOSIS — M25552 Pain in left hip: Secondary | ICD-10-CM

## 2023-03-11 DIAGNOSIS — I482 Chronic atrial fibrillation, unspecified: Secondary | ICD-10-CM | POA: Diagnosis not present

## 2023-03-11 DIAGNOSIS — E1169 Type 2 diabetes mellitus with other specified complication: Secondary | ICD-10-CM

## 2023-03-11 DIAGNOSIS — N1831 Chronic kidney disease, stage 3a: Secondary | ICD-10-CM | POA: Diagnosis not present

## 2023-03-11 DIAGNOSIS — F324 Major depressive disorder, single episode, in partial remission: Secondary | ICD-10-CM

## 2023-03-11 DIAGNOSIS — I152 Hypertension secondary to endocrine disorders: Secondary | ICD-10-CM

## 2023-03-11 DIAGNOSIS — I5022 Chronic systolic (congestive) heart failure: Secondary | ICD-10-CM | POA: Diagnosis not present

## 2023-03-11 DIAGNOSIS — E785 Hyperlipidemia, unspecified: Secondary | ICD-10-CM

## 2023-03-11 DIAGNOSIS — Z794 Long term (current) use of insulin: Secondary | ICD-10-CM

## 2023-03-11 NOTE — Assessment & Plan Note (Signed)
Ongoing and stable.  Continue Losartan at low dose for heart and kidney protection.  Labs up to date.  Will refer to nephrology if any worsening in future.

## 2023-03-11 NOTE — Assessment & Plan Note (Signed)
Chronic, stable.  BP well below goal in office today.  Recommend she continue to monitor BP at home regularly.  Focus on DASH diet.  Continue current medication regimen and adjust as needed, refills sent as needed.  LABS: up to date.  Return in 3 months.

## 2023-03-11 NOTE — Assessment & Plan Note (Signed)
Chronic, stable. Minimal use of Albuterol. Continue Trelegy which offers benefit of medication minimization and has benefited symptoms.  Continue to collaborate with Dr. Fleming, recent notes reviewed.  No current daytime O2, at night only.   

## 2023-03-11 NOTE — Assessment & Plan Note (Signed)
Chronic, ongoing with most recent EF 45-50% (10/22/22), followed by cardiology.  Euvolemic today.  Continue current medication regimen and collaboration with cardiology.  Recommend: - Reminded to call for an overnight weight gain of >2 pounds or a weekly weight gain of >5 pounds - not adding salt to food and read food labels. Reviewed the importance of keeping daily sodium intake to 2000mg  daily - Avoid Ibuprofen products

## 2023-03-11 NOTE — Assessment & Plan Note (Signed)
Ongoing for >3 months, suspect some OA present.  Scheduled for replacement of left hip upcoming.

## 2023-03-11 NOTE — Assessment & Plan Note (Addendum)
Chronic, ongoing.  Denies SI/HI.  Mood well-controlled at this time, some exacerbation with grieving over sister's illness.  Continue current medication regimen and adjust as needed.

## 2023-03-11 NOTE — Progress Notes (Signed)
BP 110/67   Pulse 93   Temp 97.9 F (36.6 C) (Oral)   Ht 4' 10.5" (1.486 m)   Wt 143 lb 6.4 oz (65 kg)   LMP  (LMP Unknown)   SpO2 94%   BMI 29.46 kg/m    Subjective:    [name], female    DOB: 08-16-1950, 72 y.o.   MRN: [id number]  HPI: Jordan Chan is a 72 y.o. female  Chief Complaint  Patient presents with   COPD   Diabetes   Hyperlipidemia   Hypertension   DIABETES A1c 7.5% in July. Continues on Metformin 1000 MG BID, Trulicity 4.5 MG weekly, Tresiba 55 units at HS, meal time insulin started on 12/11/22 to assist with A1c for surgery, taking 2-3 units before meals.   Jardiance made her sick in past and Mounjaro gave her severe diarrhea and abdominal pain.  Works with CCM team and provider on medications.   Continues monthly B12 injections for low levels.  Was seen by hematology in past, last visit 02/11/22, received 5 iron infusions total -- continues to take folic acid.  WBC remains stable, recent 9.5.  If becomes elevated (>20) and continues to elevate they will perform bone marrow biopsy.  Hypoglycemic episodes: none Polydipsia/polyuria: no Visual disturbance: no Chest pain: no Paresthesias: no Glucose Monitoring: yes             Accucheck frequency: BID             Fasting glucose: 90 to 140             Post prandial:              Evening: 180 range             Before meals: Taking Insulin?: yes             Long acting insulin: Tresiba 55 units             Short acting insulin: as above Blood Pressure Monitoring: occasional Retinal Examination: Up to Date -- [facility] Foot Exam: Up to Date Pneumovax: Up to Date Influenza: Up to Date Aspirin: yes   HYPERTENSION / HYPERLIPIDEMIA/CHF Last visit with cardiology on 10/22/22. Continues on Atorvastatin 20 MG, Metoprolol 12.5 MG daily, Lasix 40 MG daily, Losartan 12.5 MG daily, + ASA.  Currently on Eliquis.    Echo 10/22/22 for pre op = EF 45-50% with mildly decreased LV  function.  Dr. Okey Dupre cleared her for surgery. Satisfied with current treatment? yes Duration of hypertension: chronic BP monitoring frequency: occasionally BP range:  BP medication side effects: no Duration of hyperlipidemia: chronic Cholesterol medication side effects: no Cholesterol supplements: none Medication compliance: good compliance Aspirin: yes Recent stressors: no Recurrent headaches: no Visual changes: no Palpitations: no Dyspnea: baseline, no worsening Chest pain: no Lower extremity edema: no Dizzy/lightheaded: no    ATRIAL FIBRILLATION Continues on Eliquis and Metoprolol. Atrial fibrillation status: stable Satisfied with current treatment: yes  Medication side effects:  no Medication compliance: good compliance Etiology of atrial fibrillation: unknown Palpitations:  no Chest pain:  no Dyspnea on exertion:  baseline, no worsening Orthopnea:  no Syncope:  no Edema:  no Ventricular rate control: B-blocker Anti-coagulation: long acting    CHRONIC KIDNEY DISEASE (CKD 3a) CKD status: stable Medications renally dose: yes Previous renal evaluation: no Pneumovax:  Up to Date Influenza Vaccine:  Up to Date    COPD Visits with pulmonary (Dr. Meredeth Ide) for COPD --  last saw 02/10/23 -- was cleared for surgery.  Currently using Trelegy, Albuterol, and Singulair.     Has a history of smoking, quit 15 years ago. She does not use CPAP, but continues to use O2 at night 2 L == sleeps in recliner at baseline, has since 1998. COPD status: controlled Satisfied with current treatment?: yes Oxygen use: no Dyspnea frequency: baseline, no worsening Cough frequency: none Rescue inhaler frequency: very seldom Limitation of activity: no Productive cough: none Last Spirometry: with pulmonary Pneumovax: Up to Date Influenza: Up to Date   CHRONIC PAIN: Followed by [facility] in [place], last seen 01/07/23 -- continues pain pump + Gabapentin.  Followed by clinic nurse for  pump check and replacement. Reports pain is well-controlled with current regimen -- at baseline for her, except for current hip pain.  No recent falls or fractures.  Normal DEXA 2017.   She currently has pain to left hip.  Pain is intermittent, if she moves or walks it gets worse.  At worst pain is 8-9/10, her current pain pump is not helping this.  Has tried Tylenol, heat, ice.  Nothing is helping the pain.  Scheduled for replacement of hip of 04/02/23.  DEPRESSION Continues on Effexor 150 MG daily + Buspar 5 MG BID.  Her sister is currently in terminal health and this is stressor. Mood status: stable Satisfied with current treatment?: yes Symptom severity: moderate  Duration of current treatment : chronic Side effects: no Medication compliance: good compliance Psychotherapy/counseling: in the past Depressed mood: occasional Anxious mood: occasional Anhedonia: no Significant weight loss or gain: no Insomnia: yes hard to fall asleep Fatigue: no Feelings of worthlessness or guilt: no Impaired concentration/indecisiveness: no Suicidal ideations: no Hopelessness: no Crying spells: no    03/11/2023    9:25 AM 01/14/2023    1:46 PM 12/23/2022    9:52 AM 12/23/2022    9:25 AM 12/11/2022   11:41 AM  Depression screen PHQ 2/9  Decreased Interest  2 1 0 0  Down, Depressed, Hopeless  2 1 2 1   PHQ - 2 Score  4 2 2 1   Altered sleeping  2 0 3 1  Tired, decreased energy  3 1 0 3  Change in appetite  1 0 0 1  Feeling bad or failure about yourself   0 0 1 0  Trouble concentrating  1 0  0  Moving slowly or fidgety/restless  0 0 0 0  Suicidal thoughts  0 0 0 0  PHQ-9 Score  11 3 6 6   Difficult doing work/chores Not difficult at all Somewhat difficult Not difficult at all Not difficult at all Not difficult at all       01/14/2023    1:46 PM 12/23/2022    9:25 AM 12/11/2022   11:41 AM 09/24/2022    8:45 AM  GAD 7 : Generalized Anxiety Score  Nervous, Anxious, on Edge 3 2 1 1   Control/stop  worrying 2 1 1  0  Worry too much - different things 1 1 1  0  Trouble relaxing 2 1 1 1   Restless 2 0 0 0  Easily annoyed or irritable 1 1 1  0  Afraid - awful might happen 0 0 0 0  Total GAD 7 Score 11 6 5 2   Anxiety Difficulty Somewhat difficult Not difficult at all Not difficult at all    Relevant past medical, surgical, family and social history reviewed and updated as indicated. Interim medical history since our last visit  reviewed. Allergies and medications reviewed and updated.  Review of Systems  Constitutional:  Negative for activity change, appetite change, diaphoresis, fatigue and fever.  Respiratory:  Positive for shortness of breath (baseline). Negative for cough, chest tightness and wheezing.   Cardiovascular:  Negative for chest pain, palpitations and leg swelling.  Gastrointestinal: Negative.   Endocrine: Negative for heat intolerance, polydipsia, polyphagia and polyuria.  Musculoskeletal:  Positive for arthralgias (hip).  Neurological: Negative.   Psychiatric/Behavioral: Negative.      Per HPI unless specifically indicated above     Objective:    BP 110/67   Pulse 93   Temp 97.9 F (36.6 C) (Oral)   Ht 4' 10.5" (1.486 m)   Wt 143 lb 6.4 oz (65 kg)   LMP  (LMP Unknown)   SpO2 94%   BMI 29.46 kg/m   Wt Readings from Last 3 Encounters:  03/11/23 143 lb 6.4 oz (65 kg)  02/17/23 149 lb 3.2 oz (67.7 kg)  02/13/23 146 lb 11.2 oz (66.5 kg)    Physical Exam Vitals and nursing note reviewed.  Constitutional:      General: She is awake. She is not in acute distress.    Appearance: She is well-developed and well-groomed. She is not ill-appearing.  HENT:     Head: Normocephalic.     Right Ear: Hearing normal.     Left Ear: Hearing normal.  Eyes:     General: Lids are normal.        Right eye: No discharge.        Left eye: No discharge.     Conjunctiva/sclera: Conjunctivae normal.     Pupils: Pupils are equal, round, and reactive to light.  Neck:      Thyroid: No thyromegaly.     Vascular: No carotid bruit.  Cardiovascular:     Rate and Rhythm: Normal rate and regular rhythm.     Heart sounds: Normal heart sounds. No murmur heard.    No gallop.  Pulmonary:     Effort: Pulmonary effort is normal. No accessory muscle usage or respiratory distress.     Breath sounds: Normal breath sounds. No decreased breath sounds, wheezing or rhonchi.  Abdominal:     General: Bowel sounds are normal.     Palpations: Abdomen is soft.  Musculoskeletal:     Cervical back: Normal range of motion and neck supple.     Right lower leg: No edema.     Left lower leg: No edema.  Lymphadenopathy:     Cervical: No cervical adenopathy.  Skin:    General: Skin is warm and dry.  Neurological:     Mental Status: She is alert and oriented to person, place, and time.  Psychiatric:        Attention and Perception: Attention normal.        Mood and Affect: Mood normal.        Speech: Speech normal.        Behavior: Behavior normal. Behavior is cooperative.        Thought Content: Thought content normal.    Results for orders placed or performed in visit on 02/17/23  Microscopic Examination   Urine  Result Value Ref Range   WBC, UA 0-5 0 - 5 /hpf   RBC, Urine 0-2 0 - 2 /hpf   Epithelial Cells (non renal) 0-10 0 - 10 /hpf   Bacteria, UA None seen None seen/Few  Bayer DCA Hb A1c Waived  Result Value Ref Range  HB A1C (BAYER DCA - WAIVED) 7.5 (H) 4.8 - 5.6 %  Basic metabolic panel  Result Value Ref Range   Glucose 74 70 - 99 mg/dL   BUN 14 8 - 27 mg/dL   Creatinine, Ser 8.65 (H) 0.57 - 1.00 mg/dL   eGFR 50 (L) >78 IO/NGE/9.52   BUN/Creatinine Ratio 12 12 - 28   Sodium 137 134 - 144 mmol/L   Potassium 4.3 3.5 - 5.2 mmol/L   Chloride 97 96 - 106 mmol/L   CO2 23 20 - 29 mmol/L   Calcium 9.4 8.7 - 10.3 mg/dL  Urinalysis, Routine w reflex microscopic  Result Value Ref Range   Specific Gravity, UA 1.015 1.005 - 1.030   pH, UA 5.5 5.0 - 7.5   Color, UA  Yellow Yellow   Appearance Ur Clear Clear   Leukocytes,UA Trace (A) Negative   Protein,UA Negative Negative/Trace   Glucose, UA Negative Negative   Ketones, UA Trace (A) Negative   RBC, UA Negative Negative   Bilirubin, UA Negative Negative   Urobilinogen, Ur 0.2 0.2 - 1.0 mg/dL   Nitrite, UA Negative Negative   Microscopic Examination See below:       Assessment & Plan:   Problem List Items Addressed This Visit       Cardiovascular and Mediastinum   Chronic atrial fibrillation with RVR (HCC)    Chronic, ongoing.  Continue current medication regimen and collaboration with cardiology.  Recent notes reviewed.      Chronic HFrEF (heart failure with reduced ejection fraction) (HCC)    Chronic, ongoing with most recent EF 45-50% (10/22/22), followed by cardiology.  Euvolemic today.  Continue current medication regimen and collaboration with cardiology.  Recommend: - Reminded to call for an overnight weight gain of >2 pounds or a weekly weight gain of >5 pounds - not adding salt to food and read food labels. Reviewed the importance of keeping daily sodium intake to 2000mg  daily - Avoid Ibuprofen products      Hypertension associated with diabetes (HCC)    Chronic, stable.  BP well below goal in office today.  Recommend she continue to monitor BP at home regularly.  Focus on DASH diet.  Continue current medication regimen and adjust as needed, refills sent as needed.  LABS: up to date.  Return in 3 months.         Respiratory   COPD, moderate (HCC)    Chronic, stable. Minimal use of Albuterol. Continue Trelegy which offers benefit of medication minimization and has benefited symptoms.  Continue to collaborate with Dr. Meredeth Ide, recent notes reviewed.  No current daytime O2, at night only.          Endocrine   Hyperlipidemia associated with type 2 diabetes mellitus (HCC)    Chronic, stable.  Continue current medication regimen and adjust as needed.  Lipid panel up to date.       Insulin dependent type 2 diabetes mellitus (HCC) - Primary    Chronic, ongoing, insulin dependent. A1c 7.5% in July 2024, previous 8.1% in June and 9.2% in May.  Discussed at length with her today.  She is having trend down in blood sugar with addition of meal time insulin at this time and Freestyle showing improved readings. - Will continue Metformin at max dosing, Trulicity 4.5 MG weekly, and Tresiba 55 units, increase 3 units in 3 days if fasting sugar not less then 130 consistently recommended.  Continue meal time insulin, 2-5 units prior to meals and will  increase as needed.  Educated her on this.  Reviewed CCM team pharmacist note for further recommendations.   - Freestyle use continues. - Recommend she continue to monitor BS consistently at home and document + focus heavily on diet changes.  She is aware to notify provider if fasting BS >130 consistently or <70, as may need to adjust insulin further.   - In past poorly tolerated Jardiance and Mounjaro -- discussed with her that and how SGLT2 would benefit HF -- however concern for side effects.   - Foot and eye exam up to date. - Vaccinations up to date. - Return in 3 months for A1c check.         Genitourinary   CKD stage 3a, GFR 45-59 ml/min (HCC)    Ongoing and stable.  Continue Losartan at low dose for heart and kidney protection.  Labs up to date.  Will refer to nephrology if any worsening in future.        Other   Chronic pain syndrome    Chronic.  Continue to collaborate with chronic pain clinic in [facility].  Has morphine pump.        Depression, major, single episode, in partial remission (HCC)    Chronic, ongoing.  Denies SI/HI.  Mood well-controlled at this time, some exacerbation with grieving over sister's illness.  Continue current medication regimen and adjust as needed.        Pelvic joint pain, left    Ongoing for >3 months, suspect some OA present.  Scheduled for replacement of left hip upcoming.         Time: 25 minutes, >50% spent counseling/or care coordination   Follow up plan: Return in about 3 months (around 06/11/2023) for T2DM, HTN/HLD, COPD, MOOD.

## 2023-03-11 NOTE — Assessment & Plan Note (Signed)
Chronic.  Continue to collaborate with chronic pain clinic in [place].  Has morphine pump.   

## 2023-03-11 NOTE — Assessment & Plan Note (Signed)
Chronic, ongoing.  Continue current medication regimen and collaboration with cardiology.  Recent notes reviewed.

## 2023-03-11 NOTE — Assessment & Plan Note (Signed)
Chronic, stable.  Continue current medication regimen and adjust as needed.  Lipid panel up to date.

## 2023-03-11 NOTE — Assessment & Plan Note (Signed)
Chronic, ongoing, insulin dependent. A1c 7.5% in July 2024, previous 8.1% in June and 9.2% in May.  Discussed at length with her today.  She is having trend down in blood sugar with addition of meal time insulin at this time and Freestyle showing improved readings. - Will continue Metformin at max dosing, Trulicity 4.5 MG weekly, and Tresiba 55 units, increase 3 units in 3 days if fasting sugar not less then 130 consistently recommended.  Continue meal time insulin, 2-5 units prior to meals and will increase as needed.  Educated her on this.  Reviewed CCM team pharmacist note for further recommendations.   - Freestyle use continues. - Recommend she continue to monitor BS consistently at home and document + focus heavily on diet changes.  She is aware to notify provider if fasting BS >130 consistently or <70, as may need to adjust insulin further.   - In past poorly tolerated Jardiance and Mounjaro -- discussed with her that and how SGLT2 would benefit HF -- however concern for side effects.   - Foot and eye exam up to date. - Vaccinations up to date. - Return in 3 months for A1c check.

## 2023-03-14 ENCOUNTER — Emergency Department
Admission: EM | Admit: 2023-03-14 | Discharge: 2023-03-14 | Disposition: A | Discharge status: Home / Self Care | Payer: Medicare HMO | Source: Home / Self Care | Attending: Emergency Medicine | Admitting: Emergency Medicine

## 2023-03-14 ENCOUNTER — Other Ambulatory Visit: Payer: Self-pay

## 2023-03-14 ENCOUNTER — Emergency Department: Payer: Medicare HMO

## 2023-03-14 DIAGNOSIS — I110 Hypertensive heart disease with heart failure: Secondary | ICD-10-CM | POA: Insufficient documentation

## 2023-03-14 DIAGNOSIS — R1032 Left lower quadrant pain: Secondary | ICD-10-CM | POA: Diagnosis present

## 2023-03-14 DIAGNOSIS — R110 Nausea: Secondary | ICD-10-CM | POA: Diagnosis not present

## 2023-03-14 DIAGNOSIS — K529 Noninfective gastroenteritis and colitis, unspecified: Secondary | ICD-10-CM | POA: Insufficient documentation

## 2023-03-14 DIAGNOSIS — R1111 Vomiting without nausea: Secondary | ICD-10-CM | POA: Diagnosis not present

## 2023-03-14 DIAGNOSIS — K5730 Diverticulosis of large intestine without perforation or abscess without bleeding: Secondary | ICD-10-CM | POA: Diagnosis not present

## 2023-03-14 DIAGNOSIS — Z7901 Long term (current) use of anticoagulants: Secondary | ICD-10-CM | POA: Diagnosis not present

## 2023-03-14 DIAGNOSIS — I509 Heart failure, unspecified: Secondary | ICD-10-CM | POA: Insufficient documentation

## 2023-03-14 DIAGNOSIS — N281 Cyst of kidney, acquired: Secondary | ICD-10-CM | POA: Diagnosis not present

## 2023-03-14 DIAGNOSIS — R109 Unspecified abdominal pain: Secondary | ICD-10-CM | POA: Diagnosis not present

## 2023-03-14 DIAGNOSIS — R197 Diarrhea, unspecified: Secondary | ICD-10-CM

## 2023-03-14 DIAGNOSIS — R112 Nausea with vomiting, unspecified: Secondary | ICD-10-CM

## 2023-03-14 LAB — COMPREHENSIVE METABOLIC PANEL
ALT: 14 U/L (ref 0–44)
AST: 20 U/L (ref 15–41)
Albumin: 3.6 g/dL (ref 3.5–5.0)
Alkaline Phosphatase: 49 U/L (ref 38–126)
Anion gap: 9 (ref 5–15)
BUN: 16 mg/dL (ref 8–23)
CO2: 23 mmol/L (ref 22–32)
Calcium: 8.6 mg/dL — ABNORMAL LOW (ref 8.9–10.3)
Chloride: 104 mmol/L (ref 98–111)
Creatinine, Ser: 1.04 mg/dL — ABNORMAL HIGH (ref 0.44–1.00)
GFR, Estimated: 57 mL/min — ABNORMAL LOW (ref >60)
Glucose, Bld: 171 mg/dL — ABNORMAL HIGH (ref 70–99)
Potassium: 4.4 mmol/L (ref 3.5–5.1)
Sodium: 136 mmol/L (ref 135–145)
Total Bilirubin: 1.1 mg/dL (ref 0.3–1.2)
Total Protein: 7 g/dL (ref 6.5–8.1)

## 2023-03-14 LAB — URINALYSIS, ROUTINE W REFLEX MICROSCOPIC
Bilirubin Urine: NEGATIVE
Glucose, UA: NEGATIVE mg/dL
Hgb urine dipstick: NEGATIVE
Ketones, ur: 5 mg/dL — AB
Leukocytes,Ua: NEGATIVE
Nitrite: NEGATIVE
Protein, ur: NEGATIVE mg/dL
Specific Gravity, Urine: 1.029 (ref 1.005–1.030)
pH: 5 (ref 5.0–8.0)

## 2023-03-14 LAB — CBC
HCT: 37.6 % (ref 36.0–46.0)
Hemoglobin: 12.1 g/dL (ref 12.0–15.0)
MCH: 30.3 pg (ref 26.0–34.0)
MCHC: 32.2 g/dL (ref 30.0–36.0)
MCV: 94 fL (ref 80.0–100.0)
Platelets: 374 10*3/uL (ref 150–400)
RBC: 4 MIL/uL (ref 3.87–5.11)
RDW: 13.2 % (ref 11.5–15.5)
WBC: 16.9 10*3/uL — ABNORMAL HIGH (ref 4.0–10.5)
nRBC: 0 % (ref 0.0–0.2)

## 2023-03-14 LAB — LIPASE, BLOOD: Lipase: 44 U/L (ref 11–51)

## 2023-03-14 MED ORDER — SODIUM CHLORIDE 0.9 % IV BOLUS
500.0000 mL | Freq: Once | INTRAVENOUS | Status: AC
  Administered 2023-03-14: 500 mL via INTRAVENOUS

## 2023-03-14 MED ORDER — IOHEXOL 300 MG/ML  SOLN
100.0000 mL | Freq: Once | INTRAMUSCULAR | Status: AC | PRN
  Administered 2023-03-14: 100 mL via INTRAVENOUS

## 2023-03-14 MED ORDER — ONDANSETRON HCL 4 MG PO TABS: 4.0000 mg | ORAL_TABLET | Freq: Every day | ORAL | 1 refills | Status: AC | PRN

## 2023-03-14 NOTE — ED Provider Notes (Signed)
[facility] Provider Note    Event Date/Time   First MD Initiated Contact with Patient 03/14/23 [phone]     (approximate)   History   Abdominal Pain and Emesis   HPI  Jordan Chan is a 72 y.o. female   Past medical history of fibrillation on anticoagulation, CHF, hypertension hyperlipidemia, who presents to the emergency department with nausea vomiting diarrhea starting this morning.  Left lower quadrant pain.  History of diverticulitis.  No GI bleeding.  No dysuria or frequency.  Her entire daughter's family has had GI upset, diarrhea and vomiting though she has not had any close contact with this family.  She denies recent travel outside the country, recent antibiotics or hospitalization.  She got Zofran by EMS and has since felt a lot better in terms of nausea and vomiting.  Independent Historian contributed to assessment above: Daughter is at bedside to corroborate information given above    Physical Exam   Triage Vital Signs: ED Triage Vitals  Encounter Vitals Group     BP 03/14/23 0341 117/64     Systolic BP Percentile --      Diastolic BP Percentile --      Pulse Rate 03/14/23 0341 86     Resp 03/14/23 0341 18     Temp 03/14/23 0341 97.9 F (36.6 C)     Temp Source 03/14/23 0341 Oral     SpO2 03/14/23 0341 98 %     Weight 03/14/23 0339 143 lb (64.9 kg)     Height 03/14/23 0339 4' 10.5" (1.486 m)     Head Circumference --      Peak Flow --      Pain Score 03/14/23 0339 2     Pain Loc --      Pain Education --      Exclude from Growth Chart --     Most recent vital signs: Vitals:   03/14/23 0645 03/14/23 0647  BP:    Pulse: 96   Resp: 16   Temp:    SpO2:  92%    General: Awake, no distress.  CV:  Good peripheral perfusion.  Resp:  Normal effort.  Abd:  No distention.  Other:  Well-appearing euvolemic appearing in no acute distress with normal vital signs and afebrile.  Left lower quadrant tenderness to palpation without  rigidity or guarding.   ED Results / Procedures / Treatments   Labs (all labs ordered are listed, but only abnormal results are displayed) Labs Reviewed  COMPREHENSIVE METABOLIC PANEL - Abnormal; Notable for the following components:      Result Value   Glucose, Bld 171 (*)    Creatinine, Ser 1.04 (*)    Calcium 8.6 (*)    GFR, Estimated 57 (*)    All other components within normal limits  CBC - Abnormal; Notable for the following components:   WBC 16.9 (*)    All other components within normal limits  URINALYSIS, ROUTINE W REFLEX MICROSCOPIC - Abnormal; Notable for the following components:   Color, Urine YELLOW (*)    APPearance CLEAR (*)    Ketones, ur 5 (*)    All other components within normal limits  LIPASE, BLOOD     I ordered and reviewed the above labs they are notable for white blood cell count is elevated 16.9.    RADIOLOGY I independently reviewed and interpreted CT abd pelvis and see no overt obstructive or inflammatory changes I also reviewed radiologist's formal read.  PROCEDURES:  Critical Care performed: No  Procedures   MEDICATIONS ORDERED IN ED: Medications  sodium chloride 0.9 % bolus 500 mL (0 mLs Intravenous Stopped 03/14/23 0646)  iohexol (OMNIPAQUE) 300 MG/ML solution 100 mL (100 mLs Intravenous Contrast Given 03/14/23 0455)    IMPRESSION / MDM / ASSESSMENT AND PLAN / ED COURSE  I reviewed the triage vital signs and the nursing notes.                                Patient's presentation is most consistent with acute presentation with potential threat to life or bodily function.  Differential diagnosis includes, but is not limited to, viral gastroenteritis, dehydration or metabolic derangement, urinary tract infection, diverticulitis, appendicitis, cholecystitis   The patient is on the cardiac monitor to evaluate for evidence of arrhythmia and/or significant heart rate changes.  MDM:    Patient with nausea vomiting diarrhea likely  gastroenteritis with known family members with similar symptoms.  Left lower quadrant tenderness with history of diverticulitis with obtain CT scan for diverticulitis or other surgical abdominal pathologies.  Check UA for urine infection.  Appears euvolemic with a history of CHF, will give gentle fluid hydration 500 mL IV.  Responded very well to Zofran given by EMS no further nausea, will prescribe the same assuming that her workup is negative for emergent pathologies requiring hospitalization or surgery.  She otherwise appears well and if workup is unremarkable plan will be for discharge with anticipatory guidance and PMD follow-up.        FINAL CLINICAL IMPRESSION(S) / ED DIAGNOSES   Final diagnoses:  Gastroenteritis  Nausea vomiting and diarrhea  LLQ pain     Rx / DC Orders   ED Discharge Orders          Ordered    ondansetron (ZOFRAN) 4 MG tablet  Daily PRN        03/14/23 0443             Note:  This document was prepared using Dragon voice recognition software and may include unintentional dictation errors.    Pilar Jarvis, MD 03/14/23 [phone]

## 2023-03-14 NOTE — Discharge Instructions (Addendum)
Take Zofran as prescribed for nausea and vomiting.  Drink plenty of fluids to stay well-hydrated.  Find Pedialyte or similar electrolyte rehydration formulas at your local pharmacy.  Thank you for choosing [place] for your health care today!  Please see your primary doctor this week for a follow up appointment.   If you have any new, worsening, or unexpected symptoms call your doctor right away or come back to the emergency department for reevaluation.  It was my pleasure to care for you today.   Daneil Dan Modesto Charon, MD

## 2023-03-14 NOTE — ED Triage Notes (Addendum)
Pt to ED via [facility] c/o left lower abd pain, N/V/D that started at 0015 today. Pt got 4mg  zofran by EMS at 0245 that relieved nausea. Pt reports family with GI bug. Denies CP, SOB, fevers, dizziness. Hx of diabetes and afib CBG 190 140/66 98% RA 88HR 97.8

## 2023-03-18 ENCOUNTER — Telehealth: Payer: Self-pay

## 2023-03-18 DIAGNOSIS — Z978 Presence of other specified devices: Secondary | ICD-10-CM | POA: Diagnosis not present

## 2023-03-18 DIAGNOSIS — M542 Cervicalgia: Secondary | ICD-10-CM | POA: Diagnosis not present

## 2023-03-18 DIAGNOSIS — M792 Neuralgia and neuritis, unspecified: Secondary | ICD-10-CM | POA: Diagnosis not present

## 2023-03-18 DIAGNOSIS — Z5181 Encounter for therapeutic drug level monitoring: Secondary | ICD-10-CM | POA: Diagnosis not present

## 2023-03-18 DIAGNOSIS — M47816 Spondylosis without myelopathy or radiculopathy, lumbar region: Secondary | ICD-10-CM | POA: Diagnosis not present

## 2023-03-18 DIAGNOSIS — G893 Neoplasm related pain (acute) (chronic): Secondary | ICD-10-CM | POA: Diagnosis not present

## 2023-03-18 DIAGNOSIS — Z79899 Other long term (current) drug therapy: Secondary | ICD-10-CM | POA: Diagnosis not present

## 2023-03-18 DIAGNOSIS — M5442 Lumbago with sciatica, left side: Secondary | ICD-10-CM | POA: Diagnosis not present

## 2023-03-18 DIAGNOSIS — M1612 Unilateral primary osteoarthritis, left hip: Secondary | ICD-10-CM | POA: Diagnosis not present

## 2023-03-18 NOTE — Transitions of Care (Post Inpatient/ED Visit) (Signed)
03/18/2023  Name: Jordan Chan MRN: [id number] DOB: 1951-05-23  Today's TOC FU Call Status: Today's TOC FU Call Status:: Successful TOC FU Call Completed TOC FU Call Complete Date: 03/18/23  Transition Care Management Follow-up Telephone Call Date of Discharge: 03/14/23 Discharge Facility: [facility]) Type of Discharge: Emergency Department Reason for ED Visit: Other: How have you been since you were released from the hospital?: Better Any questions or concerns?: No  Items Reviewed: Did you receive and understand the discharge instructions provided?: No Medications obtained,verified, and reconciled?: Yes (Medications Reviewed) Any new allergies since your discharge?: No Dietary orders reviewed?: No Do you have support at home?: No  Medications Reviewed Today: Medications Reviewed Today     Reviewed by Pablo Ledger, CMA (Certified Medical Assistant) on 03/18/23 at 1158  Med List Status: <None>   Medication Order Taking? Sig Documenting Provider Last Dose Status Informant  ACCU-CHEK AVIVA PLUS test strip [id number] Yes TEST THREE TIMES DAILY Marjie Skiff, NP Taking Active Self, Pharmacy Records, Multiple Informants  Accu-Chek Softclix Lancets lancets [id number] Yes TEST BLOOD SUGAR THREE TIMES DAILY [name], Jolene T, NP Taking Active   albuterol (PROVENTIL HFA;VENTOLIN HFA) 108 (90 BASE) MCG/ACT inhaler [id number] Yes Inhale 2 puffs into the lungs every 6 (six) hours as needed for wheezing or shortness of breath.  [provider] Taking Active Self, Pharmacy Records, Multiple Informants  albuterol (PROVENTIL) (2.5 MG/3ML) 0.083% nebulizer solution [id number] Yes Take 3 mLs (2.5 mg total) by nebulization every 4 (four) hours as needed for wheezing or shortness of breath. Dorcas Carrow, MD Taking Active Pharmacy Records, Multiple Informants  Alcohol Swabs (DROPSAFE ALCOHOL PREP) 70 % PADS [id number] Yes USE TWICE DAILY  WITH  SUGAR   CHECKS [name], Jolene T, NP Taking Active Self, Pharmacy Records, Multiple Informants  apixaban (ELIQUIS) 5 MG TABS tablet [id number] Yes Take 1 tablet (5 mg total) by mouth 2 (two) times daily. Marjie Skiff, NP Taking Active Self, Pharmacy Records, Multiple Informants  atorvastatin (LIPITOR) 40 MG tablet [id number] Yes Take 1 tablet (40 mg total) by mouth daily. Marjie Skiff, NP Taking Active Self, Pharmacy Records, Multiple Informants  Blood Glucose Monitoring Suppl (TRUE METRIX METER) w/Device KIT [id number] Yes Use to check blood sugar 4 times a day Aura Dials T, NP Taking Active Self, Pharmacy Records, Multiple Informants  busPIRone (BUSPAR) 5 MG tablet [id number] Yes Take 1 tablet (5 mg total) by mouth 2 (two) times daily. Marjie Skiff, NP Taking Active Self, Pharmacy Records, Multiple Informants  cholecalciferol (VITAMIN D3) 25 MCG (1000 UNIT) tablet [id number] Yes Take 1,000 Units by mouth daily. [provider] Taking Active Self, Pharmacy Records, Multiple Informants  Continuous Glucose Receiver (FREESTYLE LIBRE 2 READER) DEVI [id number] Yes To check blood sugars 3-4 times daily due to insulin use. DX E11.40 Marjie Skiff, NP Taking Active   Continuous Glucose Sensor (FREESTYLE LIBRE 2 SENSOR) [place] [id number] Yes To check blood sugars 3-4 times daily due to insulin use. DX E11.40 Aura Dials T, NP Taking Active   cyanocobalamin (VITAMIN B12) injection 1,000 mcg [id number]   Aura Dials T, NP  Active   Cyanocobalamin 1000 MCG/ML KIT [id number] Yes Inject 1,000 mcg as directed every 30 (thirty) days. [provider] Taking Active Self, Pharmacy Records, Multiple Informants  cyclobenzaprine (FLEXERIL) 5 MG tablet [id number] Yes Take 5 mg by mouth 2 (two) times daily as needed for muscle spasms. Takes very rarely [provider] Taking Active Self, Pharmacy Records,  Multiple Informants           Med Note Feliz Beam, CATHERINE E   Tue Jul 19, 2019  9:07  AM)    Dulaglutide (TRULICITY) 4.5 MG/0.5ML SOPN [id number] Yes Inject 4.5 mg as directed once a week. Marjie Skiff, NP Taking Active Self, Pharmacy Records, Multiple Informants  Fluticasone-Umeclidin-Vilant (TRELEGY ELLIPTA) 100-62.5-25 MCG/ACT AEPB [id number] Yes INHALE 1 PUFF INTO THE LUNGS DAILY -42 DAY EXPIRATION AFTER OPENING FOIL TRAY [name], Jolene T, NP Taking Active   folic acid (FOLVITE) 1 MG tablet [id number] Yes TAKE 1 TABLET EVERY DAY Alinda Dooms, NP Taking Active Self, Pharmacy Records, Multiple Informants  furosemide (LASIX) 40 MG tablet [id number] Yes Take 0.5 tablets (20 mg total) by mouth daily. End, Cristal Deer, MD Taking Active Pharmacy Records, Multiple Informants  gabapentin (NEURONTIN) 800 MG tablet [id number] Yes Take 1 tablet (800 mg total) by mouth 3 (three) times daily. Marjie Skiff, NP Taking Active Self, Pharmacy Records, Multiple Informants  insulin degludec (TRESIBA FLEXTOUCH) 100 UNIT/ML FlexTouch Pen [id number] Yes Inject 55 Units into the skin at bedtime. Marjie Skiff, NP Taking Active Pharmacy Records, Multiple Informants  insulin lispro (HUMALOG KWIKPEN) 100 UNIT/ML KwikPen [id number] Yes Inject 5 Units into the skin 3 (three) times daily before meals. Do not inject insulin if you are not going to eat meal.  Only take before a meal. [name], Jolene T, NP Taking Active   losartan (COZAAR) 25 MG tablet [id number] Yes Take 0.5 tablets (12.5 mg total) by mouth daily. Marjie Skiff, NP Taking Active Self, Pharmacy Records, Multiple Informants  metFORMIN (GLUCOPHAGE) 1000 MG tablet [id number] Yes TAKE 1 TABLET TWICE DAILY WITH MEALS [name], Jolene T, NP Taking Active Self, Pharmacy Records, Multiple Informants  metoprolol succinate (TOPROL-XL) 25 MG 24 hr tablet [id number] Yes Take 0.5 tablets (12.5 mg total) by mouth daily. Marjie Skiff, NP Taking Active Self, Pharmacy Records, Multiple Informants  montelukast (SINGULAIR) 10 MG tablet [id number] Yes Take  1 tablet (10 mg total) by mouth at bedtime. Marjie Skiff, NP Taking Active Self, Pharmacy Records, Multiple Informants  Nebulizers (EASY AIR COMPRESSOR NEBULIZER) MISC [id number] Yes Use to inhaler nebulizer treatments at needed per instructions on nebulizer prescription Marjie Skiff, NP Taking Active Pharmacy Records, Multiple Informants  nystatin cream (MYCOSTATIN) [id number] Yes Apply 1 Application topically 2 (two) times daily. Aura Dials T, NP Taking Active   ondansetron (ZOFRAN) 4 MG tablet [id number] Yes Take 1 tablet (4 mg total) by mouth daily as needed for nausea or vomiting. Pilar Jarvis, MD Taking Active   PAIN MANAGEMENT INTRATHECAL, IT, PUMP [id number] Yes 1 each by Intrathecal route. Intrathecal (IT) medication:  Morphine Patient does not remember current. Adjusted every 2.5 months at [facility] in [place]. [provider] Taking Active Self, Pharmacy Records, Multiple Informants  pantoprazole (PROTONIX) 40 MG tablet [id number] Yes Take 1 tablet by mouth once daily [name], Jolene T, NP Taking Active   venlafaxine XR (EFFEXOR-XR) 150 MG 24 hr capsule [id number] Yes Take 1 capsule (150 mg total) by mouth daily. Marjie Skiff, NP Taking Active Self, Pharmacy Records, Multiple Informants            Home Care and Equipment/Supplies: Were Home Health Services Ordered?: No Any new equipment or medical supplies ordered?: No  Functional Questionnaire: Do you need assistance with bathing/showering or dressing?: No Do you need assistance with meal preparation?: No Do you need assistance with eating?: No Do you have difficulty maintaining continence: No  Do you need assistance with getting out of bed/getting out of a chair/moving?: No Do you have difficulty managing or taking your medications?: No  Follow up appointments reviewed: PCP Follow-up appointment confirmed?: No (Patient states she is better and does not want to schedule at this  time.) MD Provider Line Number:[id number] Given: No Specialist Hospital Follow-up appointment confirmed?: No Do you need transportation to your follow-up appointment?: No Do you understand care options if your condition(s) worsen?: Yes-patient verbalized understanding    SIGNATURE: Wilhemena Durie, CMA

## 2023-03-25 ENCOUNTER — Ambulatory Visit: Payer: Medicare HMO | Admitting: Nurse Practitioner

## 2023-03-26 DIAGNOSIS — Z01812 Encounter for preprocedural laboratory examination: Secondary | ICD-10-CM | POA: Diagnosis not present

## 2023-03-26 DIAGNOSIS — M1612 Unilateral primary osteoarthritis, left hip: Secondary | ICD-10-CM | POA: Diagnosis not present

## 2023-04-02 DIAGNOSIS — M1612 Unilateral primary osteoarthritis, left hip: Secondary | ICD-10-CM | POA: Diagnosis not present

## 2023-04-02 DIAGNOSIS — E1140 Type 2 diabetes mellitus with diabetic neuropathy, unspecified: Secondary | ICD-10-CM | POA: Diagnosis not present

## 2023-04-02 DIAGNOSIS — E1122 Type 2 diabetes mellitus with diabetic chronic kidney disease: Secondary | ICD-10-CM | POA: Diagnosis not present

## 2023-04-02 DIAGNOSIS — K219 Gastro-esophageal reflux disease without esophagitis: Secondary | ICD-10-CM | POA: Diagnosis not present

## 2023-04-02 DIAGNOSIS — I5020 Unspecified systolic (congestive) heart failure: Secondary | ICD-10-CM | POA: Diagnosis not present

## 2023-04-02 DIAGNOSIS — E119 Type 2 diabetes mellitus without complications: Secondary | ICD-10-CM | POA: Diagnosis not present

## 2023-04-02 DIAGNOSIS — D631 Anemia in chronic kidney disease: Secondary | ICD-10-CM | POA: Diagnosis not present

## 2023-04-02 DIAGNOSIS — I10 Essential (primary) hypertension: Secondary | ICD-10-CM | POA: Diagnosis not present

## 2023-04-02 DIAGNOSIS — N1831 Chronic kidney disease, stage 3a: Secondary | ICD-10-CM | POA: Diagnosis not present

## 2023-04-02 DIAGNOSIS — K5909 Other constipation: Secondary | ICD-10-CM | POA: Diagnosis not present

## 2023-04-02 DIAGNOSIS — E785 Hyperlipidemia, unspecified: Secondary | ICD-10-CM | POA: Diagnosis not present

## 2023-04-02 DIAGNOSIS — I130 Hypertensive heart and chronic kidney disease with heart failure and stage 1 through stage 4 chronic kidney disease, or unspecified chronic kidney disease: Secondary | ICD-10-CM | POA: Diagnosis not present

## 2023-04-03 DIAGNOSIS — M1612 Unilateral primary osteoarthritis, left hip: Secondary | ICD-10-CM | POA: Diagnosis not present

## 2023-04-03 DIAGNOSIS — K5909 Other constipation: Secondary | ICD-10-CM | POA: Diagnosis not present

## 2023-04-03 DIAGNOSIS — K219 Gastro-esophageal reflux disease without esophagitis: Secondary | ICD-10-CM | POA: Diagnosis not present

## 2023-04-03 DIAGNOSIS — E1140 Type 2 diabetes mellitus with diabetic neuropathy, unspecified: Secondary | ICD-10-CM | POA: Diagnosis not present

## 2023-04-03 DIAGNOSIS — I130 Hypertensive heart and chronic kidney disease with heart failure and stage 1 through stage 4 chronic kidney disease, or unspecified chronic kidney disease: Secondary | ICD-10-CM | POA: Diagnosis not present

## 2023-04-03 DIAGNOSIS — E1122 Type 2 diabetes mellitus with diabetic chronic kidney disease: Secondary | ICD-10-CM | POA: Diagnosis not present

## 2023-04-03 DIAGNOSIS — I5020 Unspecified systolic (congestive) heart failure: Secondary | ICD-10-CM | POA: Diagnosis not present

## 2023-04-03 DIAGNOSIS — D631 Anemia in chronic kidney disease: Secondary | ICD-10-CM | POA: Diagnosis not present

## 2023-04-03 DIAGNOSIS — N1831 Chronic kidney disease, stage 3a: Secondary | ICD-10-CM | POA: Diagnosis not present

## 2023-04-07 ENCOUNTER — Ambulatory Visit: Payer: Self-pay | Admitting: *Deleted

## 2023-04-07 NOTE — Patient Outreach (Signed)
  Care Coordination   Follow Up Visit Note   04/07/2023 Name: ZAVIAH NASTRI MRN: [id number] DOB: [birth year]  [name] is a 72 y.o. year old female who sees Aura Dials T, NP for primary care. I spoke with  [name] by phone today.  What matters to the patients health and wellness today?  Recovering from hip surgery without complications.  Call out to [facility], waiting for call back regarding start of service.   Goals Addressed             This Visit's Progress    Decrease A1C in effort to have hip surgery   On track    Care Coordination Interventions: Provided education to patient about basic DM disease process Reviewed medications with patient and discussed importance of medication adherence Counseled on importance of regular laboratory monitoring as prescribed Discussed plans with patient for ongoing care management follow up and provided patient with direct contact information for care management team Provided patient with written educational materials related to hypo and hyperglycemia and importance of correct treatment      Recover from Pneumonia and manage COPD   On track    Care Coordination Interventions: Provided patient with basic written and verbal COPD education on self care/management/and exacerbation prevention Advised patient to track and manage COPD triggers Provided written and verbal instructions on pursed lip breathing and utilized returned demonstration as teach back Provided instruction about proper use of medications used for management of COPD including inhalers Advised patient to self assesses COPD action plan zone and make appointment with provider if in the yellow zone for 48 hours without improvement          SDOH assessments and interventions completed:  No     Care Coordination Interventions:  Yes, provided   Interventions Today    Flowsheet Row Most Recent Value  Chronic Disease   Chronic disease during  today's visit Chronic Obstructive Pulmonary Disease (COPD), Other, Diabetes  [hip replacement done on 8/29]  General Interventions   General Interventions Discussed/Reviewed General Interventions Reviewed, Doctor Visits, Communication with, Durable Medical Equipment (DME), Health Screening  Doctor Visits Discussed/Reviewed Doctor Visits Reviewed, PCP, Specialist  [ortho follow up 9/10, cardiology 9/25, pulmonary 11/5, PCP 11/8]  Health Screening Mammogram  [mammogram on 9/27]  Durable Medical Equipment (DME) Walker, Bed side commode  [confirmed patient has DME to help with mobility during recovery period]  PCP/Specialist Visits Compliance with follow-up visit  Communication with --  [Called [facility] to follow up on orders for HH]  Education Interventions   Education Provided Provided Education  Provided Verbal Education On When to see the doctor, Other, Exercise  [Discussed importance of working with PT for improved mobility and strength]       Follow up plan: Follow up call scheduled for 9/10    Encounter Outcome:  Pt. Visit Completed   Kemper Durie, RN, MSN, [facility] Care Management Care Management Coordinator [phone]

## 2023-04-08 DIAGNOSIS — I4819 Other persistent atrial fibrillation: Secondary | ICD-10-CM | POA: Diagnosis not present

## 2023-04-08 DIAGNOSIS — E1140 Type 2 diabetes mellitus with diabetic neuropathy, unspecified: Secondary | ICD-10-CM | POA: Diagnosis not present

## 2023-04-08 DIAGNOSIS — D631 Anemia in chronic kidney disease: Secondary | ICD-10-CM | POA: Diagnosis not present

## 2023-04-08 DIAGNOSIS — I5022 Chronic systolic (congestive) heart failure: Secondary | ICD-10-CM | POA: Diagnosis not present

## 2023-04-08 DIAGNOSIS — N1831 Chronic kidney disease, stage 3a: Secondary | ICD-10-CM | POA: Diagnosis not present

## 2023-04-08 DIAGNOSIS — I130 Hypertensive heart and chronic kidney disease with heart failure and stage 1 through stage 4 chronic kidney disease, or unspecified chronic kidney disease: Secondary | ICD-10-CM | POA: Diagnosis not present

## 2023-04-08 DIAGNOSIS — Z471 Aftercare following joint replacement surgery: Secondary | ICD-10-CM | POA: Diagnosis not present

## 2023-04-08 DIAGNOSIS — F324 Major depressive disorder, single episode, in partial remission: Secondary | ICD-10-CM | POA: Diagnosis not present

## 2023-04-08 DIAGNOSIS — E1122 Type 2 diabetes mellitus with diabetic chronic kidney disease: Secondary | ICD-10-CM | POA: Diagnosis not present

## 2023-04-11 DIAGNOSIS — I4819 Other persistent atrial fibrillation: Secondary | ICD-10-CM | POA: Diagnosis not present

## 2023-04-11 DIAGNOSIS — D631 Anemia in chronic kidney disease: Secondary | ICD-10-CM | POA: Diagnosis not present

## 2023-04-11 DIAGNOSIS — E1140 Type 2 diabetes mellitus with diabetic neuropathy, unspecified: Secondary | ICD-10-CM | POA: Diagnosis not present

## 2023-04-11 DIAGNOSIS — F324 Major depressive disorder, single episode, in partial remission: Secondary | ICD-10-CM | POA: Diagnosis not present

## 2023-04-11 DIAGNOSIS — I130 Hypertensive heart and chronic kidney disease with heart failure and stage 1 through stage 4 chronic kidney disease, or unspecified chronic kidney disease: Secondary | ICD-10-CM | POA: Diagnosis not present

## 2023-04-11 DIAGNOSIS — N1831 Chronic kidney disease, stage 3a: Secondary | ICD-10-CM | POA: Diagnosis not present

## 2023-04-11 DIAGNOSIS — E1122 Type 2 diabetes mellitus with diabetic chronic kidney disease: Secondary | ICD-10-CM | POA: Diagnosis not present

## 2023-04-11 DIAGNOSIS — Z471 Aftercare following joint replacement surgery: Secondary | ICD-10-CM | POA: Diagnosis not present

## 2023-04-11 DIAGNOSIS — I5022 Chronic systolic (congestive) heart failure: Secondary | ICD-10-CM | POA: Diagnosis not present

## 2023-04-14 ENCOUNTER — Ambulatory Visit: Payer: Self-pay | Admitting: *Deleted

## 2023-04-14 DIAGNOSIS — D631 Anemia in chronic kidney disease: Secondary | ICD-10-CM | POA: Diagnosis not present

## 2023-04-14 DIAGNOSIS — E1122 Type 2 diabetes mellitus with diabetic chronic kidney disease: Secondary | ICD-10-CM | POA: Diagnosis not present

## 2023-04-14 DIAGNOSIS — I5022 Chronic systolic (congestive) heart failure: Secondary | ICD-10-CM | POA: Diagnosis not present

## 2023-04-14 DIAGNOSIS — Z471 Aftercare following joint replacement surgery: Secondary | ICD-10-CM | POA: Diagnosis not present

## 2023-04-14 DIAGNOSIS — I4819 Other persistent atrial fibrillation: Secondary | ICD-10-CM | POA: Diagnosis not present

## 2023-04-14 DIAGNOSIS — I130 Hypertensive heart and chronic kidney disease with heart failure and stage 1 through stage 4 chronic kidney disease, or unspecified chronic kidney disease: Secondary | ICD-10-CM | POA: Diagnosis not present

## 2023-04-14 DIAGNOSIS — N1831 Chronic kidney disease, stage 3a: Secondary | ICD-10-CM | POA: Diagnosis not present

## 2023-04-14 DIAGNOSIS — E1140 Type 2 diabetes mellitus with diabetic neuropathy, unspecified: Secondary | ICD-10-CM | POA: Diagnosis not present

## 2023-04-14 DIAGNOSIS — F324 Major depressive disorder, single episode, in partial remission: Secondary | ICD-10-CM | POA: Diagnosis not present

## 2023-04-14 NOTE — Patient Outreach (Signed)
  Care Coordination   Follow Up Visit Note   04/14/2023 Name: Jordan Chan MRN: [id number] DOB: Dec 12, 1950  Jordan Chan is a 72 y.o. year old female who sees Aura Dials T, NP for primary care.   What matters to the patients health and wellness today?  Activity with HHPT    Goals Addressed             This Visit's Progress    Decrease A1C in effort to have hip surgery   On track    Care Coordination Interventions: Provided education to patient about basic DM disease process Reviewed medications with patient and discussed importance of medication adherence Counseled on importance of regular laboratory monitoring as prescribed Discussed plans with patient for ongoing care management follow up and provided patient with direct contact information for care management team Provided patient with written educational materials related to hypo and hyperglycemia and importance of correct treatment      Recover from Pneumonia and manage COPD   On track    Care Coordination Interventions: Provided patient with basic written and verbal COPD education on self care/management/and exacerbation prevention Advised patient to track and manage COPD triggers Provided written and verbal instructions on pursed lip breathing and utilized returned demonstration as teach back Provided instruction about proper use of medications used for management of COPD including inhalers Advised patient to self assesses COPD action plan zone and make appointment with provider if in the yellow zone for 48 hours without improvement          SDOH assessments and interventions completed:  No     Care Coordination Interventions:  Yes, provided   Interventions Today    Flowsheet Row Most Recent Value  Chronic Disease   Chronic disease during today's visit Other  [s/p hip replacement]  General Interventions   General Interventions Discussed/Reviewed General Interventions Reviewed, Communication  with  Communication with --  [Call placed to [facility], confirmed start of service for HHPT was 9/7]       Follow up plan: Follow up call scheduled for 10/1    Encounter Outcome:  Patient Visit Completed   Kemper Durie, RN, MSN, [facility] Care Management Care Management Coordinator [phone]

## 2023-04-15 ENCOUNTER — Encounter: Payer: Self-pay | Admitting: Emergency Medicine

## 2023-04-15 ENCOUNTER — Other Ambulatory Visit: Payer: Self-pay

## 2023-04-15 ENCOUNTER — Inpatient Hospital Stay
Admission: EM | Admit: 2023-04-15 | Discharge: 2023-04-16 | DRG: 862 | Disposition: A | Discharge status: Other Acute Inpatient Hospital | Payer: Medicare HMO | Attending: Internal Medicine | Admitting: Internal Medicine

## 2023-04-15 DIAGNOSIS — Y838 Other surgical procedures as the cause of abnormal reaction of the patient, or of later complication, without mention of misadventure at the time of the procedure: Secondary | ICD-10-CM | POA: Diagnosis present

## 2023-04-15 DIAGNOSIS — A419 Sepsis, unspecified organism: Secondary | ICD-10-CM | POA: Diagnosis present

## 2023-04-15 DIAGNOSIS — I480 Paroxysmal atrial fibrillation: Secondary | ICD-10-CM | POA: Diagnosis not present

## 2023-04-15 DIAGNOSIS — M25452 Effusion, left hip: Secondary | ICD-10-CM | POA: Diagnosis present

## 2023-04-15 DIAGNOSIS — J449 Chronic obstructive pulmonary disease, unspecified: Secondary | ICD-10-CM | POA: Diagnosis present

## 2023-04-15 DIAGNOSIS — M1612 Unilateral primary osteoarthritis, left hip: Secondary | ICD-10-CM | POA: Diagnosis not present

## 2023-04-15 DIAGNOSIS — M545 Low back pain, unspecified: Secondary | ICD-10-CM | POA: Diagnosis present

## 2023-04-15 DIAGNOSIS — Z794 Long term (current) use of insulin: Secondary | ICD-10-CM

## 2023-04-15 DIAGNOSIS — E119 Type 2 diabetes mellitus without complications: Secondary | ICD-10-CM | POA: Diagnosis not present

## 2023-04-15 DIAGNOSIS — Z96642 Presence of left artificial hip joint: Secondary | ICD-10-CM | POA: Diagnosis present

## 2023-04-15 DIAGNOSIS — J440 Chronic obstructive pulmonary disease with acute lower respiratory infection: Secondary | ICD-10-CM | POA: Diagnosis present

## 2023-04-15 DIAGNOSIS — Z79891 Long term (current) use of opiate analgesic: Secondary | ICD-10-CM

## 2023-04-15 DIAGNOSIS — M419 Scoliosis, unspecified: Secondary | ICD-10-CM | POA: Diagnosis present

## 2023-04-15 DIAGNOSIS — I4819 Other persistent atrial fibrillation: Secondary | ICD-10-CM | POA: Diagnosis present

## 2023-04-15 DIAGNOSIS — Z7984 Long term (current) use of oral hypoglycemic drugs: Secondary | ICD-10-CM | POA: Diagnosis not present

## 2023-04-15 DIAGNOSIS — G8929 Other chronic pain: Secondary | ICD-10-CM | POA: Diagnosis not present

## 2023-04-15 DIAGNOSIS — Z9221 Personal history of antineoplastic chemotherapy: Secondary | ICD-10-CM | POA: Diagnosis not present

## 2023-04-15 DIAGNOSIS — T8141XA Infection following a procedure, superficial incisional surgical site, initial encounter: Principal | ICD-10-CM | POA: Diagnosis present

## 2023-04-15 DIAGNOSIS — I428 Other cardiomyopathies: Secondary | ICD-10-CM | POA: Diagnosis present

## 2023-04-15 DIAGNOSIS — Z1152 Encounter for screening for COVID-19: Secondary | ICD-10-CM

## 2023-04-15 DIAGNOSIS — G4730 Sleep apnea, unspecified: Secondary | ICD-10-CM | POA: Diagnosis present

## 2023-04-15 DIAGNOSIS — Z9981 Dependence on supplemental oxygen: Secondary | ICD-10-CM

## 2023-04-15 DIAGNOSIS — E7800 Pure hypercholesterolemia, unspecified: Secondary | ICD-10-CM | POA: Diagnosis present

## 2023-04-15 DIAGNOSIS — Z87891 Personal history of nicotine dependence: Secondary | ICD-10-CM

## 2023-04-15 DIAGNOSIS — Z853 Personal history of malignant neoplasm of breast: Secondary | ICD-10-CM

## 2023-04-15 DIAGNOSIS — D631 Anemia in chronic kidney disease: Secondary | ICD-10-CM | POA: Diagnosis not present

## 2023-04-15 DIAGNOSIS — Z7951 Long term (current) use of inhaled steroids: Secondary | ICD-10-CM

## 2023-04-15 DIAGNOSIS — Z923 Personal history of irradiation: Secondary | ICD-10-CM | POA: Diagnosis not present

## 2023-04-15 DIAGNOSIS — R600 Localized edema: Secondary | ICD-10-CM | POA: Diagnosis not present

## 2023-04-15 DIAGNOSIS — K589 Irritable bowel syndrome without diarrhea: Secondary | ICD-10-CM | POA: Diagnosis present

## 2023-04-15 DIAGNOSIS — E1142 Type 2 diabetes mellitus with diabetic polyneuropathy: Secondary | ICD-10-CM | POA: Diagnosis present

## 2023-04-15 DIAGNOSIS — Z885 Allergy status to narcotic agent status: Secondary | ICD-10-CM

## 2023-04-15 DIAGNOSIS — Z7901 Long term (current) use of anticoagulants: Secondary | ICD-10-CM

## 2023-04-15 DIAGNOSIS — T8149XA Infection following a procedure, other surgical site, initial encounter: Secondary | ICD-10-CM | POA: Diagnosis not present

## 2023-04-15 DIAGNOSIS — R0601 Orthopnea: Secondary | ICD-10-CM | POA: Diagnosis present

## 2023-04-15 DIAGNOSIS — J189 Pneumonia, unspecified organism: Secondary | ICD-10-CM | POA: Diagnosis present

## 2023-04-15 DIAGNOSIS — Z79899 Other long term (current) drug therapy: Secondary | ICD-10-CM

## 2023-04-15 DIAGNOSIS — R918 Other nonspecific abnormal finding of lung field: Secondary | ICD-10-CM | POA: Diagnosis not present

## 2023-04-15 DIAGNOSIS — I5022 Chronic systolic (congestive) heart failure: Secondary | ICD-10-CM | POA: Diagnosis present

## 2023-04-15 DIAGNOSIS — I130 Hypertensive heart and chronic kidney disease with heart failure and stage 1 through stage 4 chronic kidney disease, or unspecified chronic kidney disease: Secondary | ICD-10-CM | POA: Diagnosis not present

## 2023-04-15 DIAGNOSIS — Z9011 Acquired absence of right breast and nipple: Secondary | ICD-10-CM

## 2023-04-15 DIAGNOSIS — N1831 Chronic kidney disease, stage 3a: Secondary | ICD-10-CM | POA: Diagnosis not present

## 2023-04-15 DIAGNOSIS — Z83438 Family history of other disorder of lipoprotein metabolism and other lipidemia: Secondary | ICD-10-CM | POA: Diagnosis not present

## 2023-04-15 DIAGNOSIS — Z8249 Family history of ischemic heart disease and other diseases of the circulatory system: Secondary | ICD-10-CM | POA: Diagnosis not present

## 2023-04-15 DIAGNOSIS — Z8619 Personal history of other infectious and parasitic diseases: Secondary | ICD-10-CM

## 2023-04-15 DIAGNOSIS — N281 Cyst of kidney, acquired: Secondary | ICD-10-CM | POA: Diagnosis not present

## 2023-04-15 DIAGNOSIS — J45909 Unspecified asthma, uncomplicated: Secondary | ICD-10-CM | POA: Diagnosis not present

## 2023-04-15 DIAGNOSIS — Z8673 Personal history of transient ischemic attack (TIA), and cerebral infarction without residual deficits: Secondary | ICD-10-CM | POA: Diagnosis not present

## 2023-04-15 DIAGNOSIS — I110 Hypertensive heart disease with heart failure: Secondary | ICD-10-CM | POA: Diagnosis present

## 2023-04-15 DIAGNOSIS — I5020 Unspecified systolic (congestive) heart failure: Secondary | ICD-10-CM | POA: Diagnosis not present

## 2023-04-15 DIAGNOSIS — I10 Essential (primary) hypertension: Secondary | ICD-10-CM | POA: Diagnosis not present

## 2023-04-15 DIAGNOSIS — D72829 Elevated white blood cell count, unspecified: Secondary | ICD-10-CM | POA: Diagnosis not present

## 2023-04-15 DIAGNOSIS — N183 Chronic kidney disease, stage 3 unspecified: Secondary | ICD-10-CM | POA: Diagnosis not present

## 2023-04-15 DIAGNOSIS — M1990 Unspecified osteoarthritis, unspecified site: Secondary | ICD-10-CM | POA: Diagnosis present

## 2023-04-15 DIAGNOSIS — Z881 Allergy status to other antibiotic agents status: Secondary | ICD-10-CM

## 2023-04-15 DIAGNOSIS — Z90710 Acquired absence of both cervix and uterus: Secondary | ICD-10-CM

## 2023-04-15 DIAGNOSIS — R6883 Chills (without fever): Secondary | ICD-10-CM | POA: Diagnosis present

## 2023-04-15 DIAGNOSIS — R079 Chest pain, unspecified: Secondary | ICD-10-CM | POA: Diagnosis not present

## 2023-04-15 DIAGNOSIS — K219 Gastro-esophageal reflux disease without esophagitis: Secondary | ICD-10-CM | POA: Diagnosis present

## 2023-04-15 DIAGNOSIS — Z888 Allergy status to other drugs, medicaments and biological substances status: Secondary | ICD-10-CM

## 2023-04-15 DIAGNOSIS — Z7985 Long-term (current) use of injectable non-insulin antidiabetic drugs: Secondary | ICD-10-CM

## 2023-04-15 LAB — CBC WITH DIFFERENTIAL/PLATELET
Abs Immature Granulocytes: 0.12 10*3/uL — ABNORMAL HIGH (ref 0.00–0.07)
Basophils Absolute: 0.1 10*3/uL (ref 0.0–0.1)
Basophils Relative: 1 %
Eosinophils Absolute: 0.2 10*3/uL (ref 0.0–0.5)
Eosinophils Relative: 1 %
HCT: 32.9 % — ABNORMAL LOW (ref 36.0–46.0)
Hemoglobin: 10.5 g/dL — ABNORMAL LOW (ref 12.0–15.0)
Immature Granulocytes: 1 %
Lymphocytes Relative: 18 %
Lymphs Abs: 3.2 10*3/uL (ref 0.7–4.0)
MCH: 29.4 pg (ref 26.0–34.0)
MCHC: 31.9 g/dL (ref 30.0–36.0)
MCV: 92.2 fL (ref 80.0–100.0)
Monocytes Absolute: 1 10*3/uL (ref 0.1–1.0)
Monocytes Relative: 6 %
Neutro Abs: 13 10*3/uL — ABNORMAL HIGH (ref 1.7–7.7)
Neutrophils Relative %: 73 %
Platelets: 785 10*3/uL — ABNORMAL HIGH (ref 150–400)
RBC: 3.57 MIL/uL — ABNORMAL LOW (ref 3.87–5.11)
RDW: 13.2 % (ref 11.5–15.5)
WBC: 17.6 10*3/uL — ABNORMAL HIGH (ref 4.0–10.5)
nRBC: 0 % (ref 0.0–0.2)

## 2023-04-15 LAB — COMPREHENSIVE METABOLIC PANEL
ALT: 11 U/L (ref 0–44)
AST: 17 U/L (ref 15–41)
Albumin: 3.6 g/dL (ref 3.5–5.0)
Alkaline Phosphatase: 68 U/L (ref 38–126)
Anion gap: 13 (ref 5–15)
BUN: 10 mg/dL (ref 8–23)
CO2: 24 mmol/L (ref 22–32)
Calcium: 9.4 mg/dL (ref 8.9–10.3)
Chloride: 99 mmol/L (ref 98–111)
Creatinine, Ser: 0.85 mg/dL (ref 0.44–1.00)
GFR, Estimated: >60 mL/min (ref >60)
Glucose, Bld: 147 mg/dL — ABNORMAL HIGH (ref 70–99)
Potassium: 3.9 mmol/L (ref 3.5–5.1)
Sodium: 136 mmol/L (ref 135–145)
Total Bilirubin: 0.3 mg/dL (ref 0.3–1.2)
Total Protein: 7.9 g/dL (ref 6.5–8.1)

## 2023-04-15 LAB — LACTIC ACID, PLASMA: Lactic Acid, Venous: 1.5 mmol/L (ref 0.5–1.9)

## 2023-04-15 MED ORDER — ACETAMINOPHEN 500 MG PO TABS
1000.0000 mg | ORAL_TABLET | Freq: Once | ORAL | Status: AC
  Administered 2023-04-15: 1000 mg via ORAL
  Filled 2023-04-15: qty 2

## 2023-04-15 MED ORDER — LACTATED RINGERS IV BOLUS
1000.0000 mL | Freq: Once | INTRAVENOUS | Status: AC
  Administered 2023-04-16: 1000 mL via INTRAVENOUS

## 2023-04-15 NOTE — ED Triage Notes (Signed)
Pt in with pain to L hip, along with chills. Pt states she had a THR to L hip on 8/29 by Dr. Odis Luster with [facility]. Had staples removed yesterday, and has been having severe L hip pain since. Some redness noted to upper incision site, 1 staple remains with pus surrounding. Ambulatory into triage with walker

## 2023-04-15 NOTE — ED Provider Notes (Signed)
[facility] Provider Note    Event Date/Time   First MD Initiated Contact with Patient 04/15/23 2328     (approximate)   History   Hip Pain and Chills   HPI  Jordan Chan is a 72 y.o. female who presents to the ED for evaluation of Hip Pain and Chills   Patient reports a left hip THA with [facility] 2 weeks ago that was uncomplicated.   Staples out as an outpatient 2 days ago but concerned that the left 1 staple in place within the inguinal crease.  For the past 1 day she reports chills and rigors, increased stool output and increasing pain to her left hip.  No urinary changes, emesis, cough or congestion   Physical Exam   Triage Vital Signs: ED Triage Vitals  Encounter Vitals Group     BP 04/15/23 2210 139/62     Systolic BP Percentile --      Diastolic BP Percentile --      Pulse Rate 04/15/23 2210 77     Resp 04/15/23 2210 19     Temp 04/15/23 2210 97.8 F (36.6 C)     Temp Source 04/15/23 2210 Oral     SpO2 04/15/23 2210 95 %     Weight 04/15/23 2212 143 lb 1.3 oz (64.9 kg)     Height --      Head Circumference --      Peak Flow --      Pain Score 04/15/23 2211 8     Pain Loc --      Pain Education --      Exclude from Growth Chart --     Most recent vital signs: Vitals:   04/16/23 0036 04/16/23 0217  BP:  133/69  Pulse: 76 99  Resp:  18  Temp:  97.9 F (36.6 C)  SpO2: 99% 98%    General: Awake, no distress.  Chills and rigors, seems uncomfortable CV:  Good peripheral perfusion.  Resp:  Normal effort.  Abd:  No distention.  Soft MSK:  No deformity noted.  Neuro:  No focal deficits appreciated. Other:  Postoperative incision as below with Steri-Strips in place.  Superior aspect of this line does have some erythematous skin and there is 1 residual staple in place.  Minimal punctate dehiscence at 2 places without expressible purulence     ED Results / Procedures / Treatments   Labs (all labs ordered are listed,  but only abnormal results are displayed) Labs Reviewed  COMPREHENSIVE METABOLIC PANEL - Abnormal; Notable for the following components:      Result Value   Glucose, Bld 147 (*)    All other components within normal limits  CBC WITH DIFFERENTIAL/PLATELET - Abnormal; Notable for the following components:   WBC 17.6 (*)    RBC 3.57 (*)    Hemoglobin 10.5 (*)    HCT 32.9 (*)    Platelets 785 (*)    Neutro Abs 13.0 (*)    Abs Immature Granulocytes 0.12 (*)    All other components within normal limits  URINALYSIS, ROUTINE W REFLEX MICROSCOPIC - Abnormal; Notable for the following components:   Color, Urine YELLOW (*)    APPearance CLEAR (*)    All other components within normal limits  SARS CORONAVIRUS 2 BY RT PCR  CULTURE, BLOOD (ROUTINE X 2)  CULTURE, BLOOD (ROUTINE X 2)  LACTIC ACID, PLASMA  PROCALCITONIN    EKG   RADIOLOGY 1 view CXR interpreted by  me with left basilar infiltrate CT abdomen/pelvis interpreted by me without evidence of acute pathology. CT left hip interpreted by me with no clear abscess  Official radiology report(s): DG Chest Portable 1 View  Result Date: 04/16/2023 CLINICAL DATA:  Recent hip surgery, presenting with chills and hip pain. EXAM: PORTABLE CHEST 1 VIEW COMPARISON:  January 05, 2023 FINDINGS: The heart size and mediastinal contours are within normal limits. There is mild prominence of the pulmonary vasculature. Mild, diffuse, chronic appearing increased lung markings are also seen with mild superimposed infiltrate within the mid left lung and left lung base. No pleural effusion or pneumothorax is identified. The visualized skeletal structures are unremarkable. IMPRESSION: 1. Findings which may represent mild pulmonary vascular congestion. 2. Mild, diffuse, chronic appearing increased lung markings with mild superimposed infiltrate within the mid left lung and left lung base. Electronically Signed   By: Aram Candela M.D.   On: 04/16/2023 02:19   CT HIP  LEFT W CONTRAST  Result Date: 04/16/2023 CLINICAL DATA:  Two weeks status post left total hip arthroplasty, incisional erythema and purulence, left hip pain EXAM: CT OF THE LOWER LEFT EXTREMITY WITH CONTRAST TECHNIQUE: Multidetector CT imaging of the lower left extremity was performed according to the standard protocol following intravenous contrast administration. RADIATION DOSE REDUCTION: This exam was performed according to the departmental dose-optimization program which includes automated exposure control, adjustment of the mA and/or kV according to patient size and/or use of iterative reconstruction technique. CONTRAST:  OMNIPAQUE IOHEXOL 300 MG/ML  SOLN COMPARISON:  None Available. FINDINGS: Bones/Joint/Cartilage Surgical changes of left total hip arthroplasty are identified. No acute fracture or dislocation. Small ossific fragments are seen surrounding the resection margin of the proximal femur, likely postsurgical in nature. No dislocation. No pathologic osseous erosions or abnormal periosteal reaction. Ligaments Suboptimally assessed by CT. Muscles and Tendons A a right hip effusion is present which extends into the adductor musculature medially, best seen on axial image # 60/4 and coronal image # 61/5. Muscular structures are otherwise unremarkable. Iliopsoas, gluteal, and hamstring tendons appear intact. Soft tissues Mild subcutaneous edema within the proximal left lower extremity laterally. Single surgical skin staple noted within the inguinal crease. No subcutaneous fluid collection identified. Limited images of the pelvis are unremarkable. IMPRESSION: 1. Surgical changes of left total hip arthroplasty. No acute fracture or dislocation. No CT evidence of hardware complication. 2. Left hip effusion which extends into the adductor musculature medially. This is nonspecific and may be postsurgical in nature, however, superimposed infection cannot be excluded. 3. Mild subcutaneous edema within the  proximal left lower extremity laterally. No subcutaneous fluid collection identified. Electronically Signed   By: Helyn Numbers M.D.   On: 04/16/2023 01:48   CT ABDOMEN PELVIS W CONTRAST  Result Date: 04/16/2023 CLINICAL DATA:  Diarrhea, chills and leukocytosis. EXAM: CT ABDOMEN AND PELVIS WITH CONTRAST TECHNIQUE: Multidetector CT imaging of the abdomen and pelvis was performed using the standard protocol following bolus administration of intravenous contrast. RADIATION DOSE REDUCTION: This exam was performed according to the departmental dose-optimization program which includes automated exposure control, adjustment of the mA and/or kV according to patient size and/or use of iterative reconstruction technique. CONTRAST:  OMNIPAQUE IOHEXOL 300 MG/ML  SOLN COMPARISON:  CT abdomen and pelvis 03/14/2023 FINDINGS: Lower chest: No acute abnormality. Hepatobiliary: No focal liver abnormality is seen. No gallstones, gallbladder wall thickening, or biliary dilatation. Pancreas: Unremarkable. No pancreatic ductal dilatation or surrounding inflammatory changes. Spleen: Normal in size without focal abnormality. Adrenals/Urinary  Tract: Right renal cysts are present measuring up to 4.4 cm. Left renal cysts are present measuring up to 3.2 cm. There is no hydronephrosis or perinephric fat stranding. The adrenal glands are within normal limits. The bladder is not well evaluated secondary to streak artifact in the pelvis. Stomach/Bowel: Stomach is within normal limits. Appendix appears normal. No evidence of bowel wall thickening, distention, or inflammatory changes. Vascular/Lymphatic: Aortic atherosclerosis. No enlarged abdominal or pelvic lymph nodes. Reproductive: Status post hysterectomy. No adnexal masses. Other: No ascites or focal abdominal wall hernia. Generator or pump is seen in the anterior abdominal wall with catheter extending into the central spinal canal of the thoracic level. Right mastectomy changes.  Musculoskeletal: Left hip arthroplasty is present. No acute findings. IMPRESSION: 1. No acute localizing process in the abdomen or pelvis. 2. Bilateral renal cysts. No follow-up imaging recommended. 3. Aortic atherosclerosis. Aortic Atherosclerosis (ICD10-I70.0). Electronically Signed   By: Darliss Cheney M.D.   On: 04/16/2023 01:41    PROCEDURES and INTERVENTIONS:  .1-3 Lead EKG Interpretation  Performed by: Delton Prairie, MD Authorized by: Delton Prairie, MD     Interpretation: normal     ECG rate:  90   ECG rate assessment: normal     Rhythm: sinus rhythm     Ectopy: none     Conduction: normal   .Critical Care  Performed by: Delton Prairie, MD Authorized by: Delton Prairie, MD   Critical care provider statement:    Critical care time (minutes):  30   Critical care time was exclusive of:  Separately billable procedures and treating other patients   Critical care was necessary to treat or prevent imminent or life-threatening deterioration of the following conditions:  Sepsis   Critical care was time spent personally by me on the following activities:  Development of treatment plan with patient or surrogate, discussions with consultants, evaluation of patient's response to treatment, examination of patient, ordering and review of laboratory studies, ordering and review of radiographic studies, ordering and performing treatments and interventions, pulse oximetry, re-evaluation of patient's condition and review of old charts   Medications  cefTRIAXone (ROCEPHIN) 2 g in sodium chloride 0.9 % 100 mL IVPB (has no administration in time range)  azithromycin (ZITHROMAX) 500 mg in sodium chloride 0.9 % 250 mL IVPB (has no administration in time range)  lactated ringers infusion (has no administration in time range)  vancomycin (VANCOREADY) IVPB 1750 mg/350 mL (has no administration in time range)  lactated ringers bolus 1,000 mL (0 mLs Intravenous Stopped 04/16/23 0213)  acetaminophen (TYLENOL) tablet  1,000 mg (1,000 mg Oral Given 04/15/23 2351)  fentaNYL (SUBLIMAZE) injection 50 mcg (50 mcg Intravenous Given 04/16/23 0023)  ondansetron (ZOFRAN) injection 4 mg (4 mg Intravenous Given 04/16/23 0023)  iohexol (OMNIPAQUE) 300 MG/ML solution 100 mL (100 mLs Intravenous Contrast Given 04/16/23 0039)  HYDROmorphone (DILAUDID) injection 0.5 mg (0.5 mg Intravenous Given 04/16/23 0119)  cephALEXin (KEFLEX) capsule 500 mg (500 mg Oral Given 04/16/23 0216)     IMPRESSION / MDM / ASSESSMENT AND PLAN / ED COURSE  I reviewed the triage vital signs and the nursing notes.  Differential diagnosis includes, but is not limited to, sepsis UTI, abscess, cellulitis or wound dehiscence, viral syndrome  {Patient presents with symptoms of an acute illness or injury that is potentially life-threatening.  Patient presents with rigors and evidence of sepsis from pneumonia and possibly cellulitis from a surgical site, requiring medical admission.  Essentially normal vital signs but her heart rate does escalate  to the 90s while being observed.  She has rigors and appears uncomfortable, but no documented fevers.  Leukocytosis is noted but blood work is otherwise reassuring with a normal lactic acid, metabolic panel and procalcitonin.  COVID swab is negative and her urine is clear.  Initially CT abdomen/pelvis and her hip considering her localized pain and diarrhea but these are generally reassuring.  Due to rigors on reassessments I do obtain a CXR that does demonstrate a pneumonia.  She is meeting sepsis criteria we will consult medicine for admission  Clinical Course as of 04/16/23 0236  Thu Apr 16, 2023  0202 Reassessed and discussed workup and plan of care.  She is appreciative. [DS]  0232 X-ray does show a left basilar infiltrate.  Considering her aggressive rigors I am quite concerned about sepsis.  We are not documenting a fever here but I am concerned about the systemic illness.  I update the patient of these concerns  and she is agreeable to stay for IV antibiotics.  Consult with medicine who agrees to admit [DS]    Clinical Course User Index [DS] Delton Prairie, MD     FINAL CLINICAL IMPRESSION(S) / ED DIAGNOSES   Final diagnoses:  Sepsis due to pneumonia [facility])     Rx / DC Orders   ED Discharge Orders     None        Note:  This document was prepared using Dragon voice recognition software and may include unintentional dictation errors.   Delton Prairie, MD 04/16/23 [phone]

## 2023-04-16 ENCOUNTER — Emergency Department: Payer: Medicare HMO

## 2023-04-16 DIAGNOSIS — D72829 Elevated white blood cell count, unspecified: Secondary | ICD-10-CM | POA: Diagnosis not present

## 2023-04-16 DIAGNOSIS — J45909 Unspecified asthma, uncomplicated: Secondary | ICD-10-CM | POA: Diagnosis not present

## 2023-04-16 DIAGNOSIS — T8149XA Infection following a procedure, other surgical site, initial encounter: Secondary | ICD-10-CM

## 2023-04-16 DIAGNOSIS — J449 Chronic obstructive pulmonary disease, unspecified: Secondary | ICD-10-CM | POA: Diagnosis not present

## 2023-04-16 DIAGNOSIS — I10 Essential (primary) hypertension: Secondary | ICD-10-CM | POA: Diagnosis not present

## 2023-04-16 DIAGNOSIS — Z7984 Long term (current) use of oral hypoglycemic drugs: Secondary | ICD-10-CM | POA: Diagnosis not present

## 2023-04-16 DIAGNOSIS — Z8673 Personal history of transient ischemic attack (TIA), and cerebral infarction without residual deficits: Secondary | ICD-10-CM | POA: Diagnosis not present

## 2023-04-16 DIAGNOSIS — N1831 Chronic kidney disease, stage 3a: Secondary | ICD-10-CM | POA: Diagnosis not present

## 2023-04-16 DIAGNOSIS — Z1152 Encounter for screening for COVID-19: Secondary | ICD-10-CM | POA: Diagnosis not present

## 2023-04-16 DIAGNOSIS — A419 Sepsis, unspecified organism: Secondary | ICD-10-CM | POA: Diagnosis present

## 2023-04-16 DIAGNOSIS — Z96642 Presence of left artificial hip joint: Secondary | ICD-10-CM | POA: Diagnosis present

## 2023-04-16 DIAGNOSIS — M1612 Unilateral primary osteoarthritis, left hip: Secondary | ICD-10-CM | POA: Diagnosis not present

## 2023-04-16 DIAGNOSIS — I428 Other cardiomyopathies: Secondary | ICD-10-CM | POA: Diagnosis present

## 2023-04-16 DIAGNOSIS — Z853 Personal history of malignant neoplasm of breast: Secondary | ICD-10-CM | POA: Diagnosis not present

## 2023-04-16 DIAGNOSIS — Z7901 Long term (current) use of anticoagulants: Secondary | ICD-10-CM | POA: Diagnosis not present

## 2023-04-16 DIAGNOSIS — I130 Hypertensive heart and chronic kidney disease with heart failure and stage 1 through stage 4 chronic kidney disease, or unspecified chronic kidney disease: Secondary | ICD-10-CM | POA: Diagnosis not present

## 2023-04-16 DIAGNOSIS — Z923 Personal history of irradiation: Secondary | ICD-10-CM | POA: Diagnosis not present

## 2023-04-16 DIAGNOSIS — Z9981 Dependence on supplemental oxygen: Secondary | ICD-10-CM | POA: Diagnosis not present

## 2023-04-16 DIAGNOSIS — R6883 Chills (without fever): Secondary | ICD-10-CM | POA: Diagnosis present

## 2023-04-16 DIAGNOSIS — M545 Low back pain, unspecified: Secondary | ICD-10-CM | POA: Diagnosis not present

## 2023-04-16 DIAGNOSIS — D631 Anemia in chronic kidney disease: Secondary | ICD-10-CM | POA: Diagnosis not present

## 2023-04-16 DIAGNOSIS — E1142 Type 2 diabetes mellitus with diabetic polyneuropathy: Secondary | ICD-10-CM | POA: Diagnosis present

## 2023-04-16 DIAGNOSIS — G8929 Other chronic pain: Secondary | ICD-10-CM | POA: Diagnosis not present

## 2023-04-16 DIAGNOSIS — I5022 Chronic systolic (congestive) heart failure: Secondary | ICD-10-CM | POA: Diagnosis present

## 2023-04-16 DIAGNOSIS — Z83438 Family history of other disorder of lipoprotein metabolism and other lipidemia: Secondary | ICD-10-CM | POA: Diagnosis not present

## 2023-04-16 DIAGNOSIS — E119 Type 2 diabetes mellitus without complications: Secondary | ICD-10-CM | POA: Diagnosis not present

## 2023-04-16 DIAGNOSIS — M419 Scoliosis, unspecified: Secondary | ICD-10-CM | POA: Diagnosis present

## 2023-04-16 DIAGNOSIS — N183 Chronic kidney disease, stage 3 unspecified: Secondary | ICD-10-CM | POA: Diagnosis not present

## 2023-04-16 DIAGNOSIS — T8141XA Infection following a procedure, superficial incisional surgical site, initial encounter: Secondary | ICD-10-CM | POA: Diagnosis present

## 2023-04-16 DIAGNOSIS — Z87891 Personal history of nicotine dependence: Secondary | ICD-10-CM | POA: Diagnosis not present

## 2023-04-16 DIAGNOSIS — J189 Pneumonia, unspecified organism: Secondary | ICD-10-CM | POA: Diagnosis present

## 2023-04-16 DIAGNOSIS — I480 Paroxysmal atrial fibrillation: Secondary | ICD-10-CM | POA: Diagnosis not present

## 2023-04-16 DIAGNOSIS — Z9221 Personal history of antineoplastic chemotherapy: Secondary | ICD-10-CM | POA: Diagnosis not present

## 2023-04-16 DIAGNOSIS — Z794 Long term (current) use of insulin: Secondary | ICD-10-CM | POA: Diagnosis not present

## 2023-04-16 DIAGNOSIS — I110 Hypertensive heart disease with heart failure: Secondary | ICD-10-CM | POA: Diagnosis present

## 2023-04-16 DIAGNOSIS — Z8249 Family history of ischemic heart disease and other diseases of the circulatory system: Secondary | ICD-10-CM | POA: Diagnosis not present

## 2023-04-16 DIAGNOSIS — J440 Chronic obstructive pulmonary disease with acute lower respiratory infection: Secondary | ICD-10-CM | POA: Diagnosis present

## 2023-04-16 DIAGNOSIS — I4819 Other persistent atrial fibrillation: Secondary | ICD-10-CM | POA: Diagnosis present

## 2023-04-16 DIAGNOSIS — E7800 Pure hypercholesterolemia, unspecified: Secondary | ICD-10-CM | POA: Diagnosis present

## 2023-04-16 DIAGNOSIS — Y838 Other surgical procedures as the cause of abnormal reaction of the patient, or of later complication, without mention of misadventure at the time of the procedure: Secondary | ICD-10-CM | POA: Diagnosis present

## 2023-04-16 DIAGNOSIS — I5020 Unspecified systolic (congestive) heart failure: Secondary | ICD-10-CM | POA: Diagnosis not present

## 2023-04-16 LAB — URINALYSIS, ROUTINE W REFLEX MICROSCOPIC
Bilirubin Urine: NEGATIVE
Glucose, UA: NEGATIVE mg/dL
Hgb urine dipstick: NEGATIVE
Ketones, ur: NEGATIVE mg/dL
Leukocytes,Ua: NEGATIVE
Nitrite: NEGATIVE
Protein, ur: NEGATIVE mg/dL
Specific Gravity, Urine: 1.015 (ref 1.005–1.030)
pH: 7 (ref 5.0–8.0)

## 2023-04-16 LAB — GLUCOSE, CAPILLARY
Glucose-Capillary: 136 mg/dL — ABNORMAL HIGH (ref 70–99)
Glucose-Capillary: 115 mg/dL — ABNORMAL HIGH (ref 70–99)
Glucose-Capillary: 154 mg/dL — ABNORMAL HIGH (ref 70–99)
Glucose-Capillary: 189 mg/dL — ABNORMAL HIGH (ref 70–99)

## 2023-04-16 LAB — CBC
HCT: 32.6 % — ABNORMAL LOW (ref 36.0–46.0)
Hemoglobin: 10.3 g/dL — ABNORMAL LOW (ref 12.0–15.0)
MCH: 28.9 pg (ref 26.0–34.0)
MCHC: 31.6 g/dL (ref 30.0–36.0)
MCV: 91.6 fL (ref 80.0–100.0)
Platelets: 801 10*3/uL — ABNORMAL HIGH (ref 150–400)
RBC: 3.56 MIL/uL — ABNORMAL LOW (ref 3.87–5.11)
RDW: 13.2 % (ref 11.5–15.5)
WBC: 20 10*3/uL — ABNORMAL HIGH (ref 4.0–10.5)
nRBC: 0 % (ref 0.0–0.2)

## 2023-04-16 LAB — PROCALCITONIN: Procalcitonin: <0.1 ng/mL

## 2023-04-16 LAB — SARS CORONAVIRUS 2 BY RT PCR: SARS Coronavirus 2 by RT PCR: NEGATIVE

## 2023-04-16 MED ORDER — PANTOPRAZOLE SODIUM 40 MG PO TBEC
40.0000 mg | DELAYED_RELEASE_TABLET | Freq: Every day | ORAL | Status: DC
  Administered 2023-04-16: 40 mg via ORAL
  Filled 2023-04-16: qty 1

## 2023-04-16 MED ORDER — INSULIN ASPART 100 UNIT/ML IJ SOLN
0.0000 [IU] | INTRAMUSCULAR | Status: DC
  Administered 2023-04-16: 2 [IU] via SUBCUTANEOUS
  Administered 2023-04-16: 3 [IU] via SUBCUTANEOUS
  Filled 2023-04-16 (×2): qty 1

## 2023-04-16 MED ORDER — SODIUM CHLORIDE 0.9 % IV SOLN
500.0000 mg | Freq: Once | INTRAVENOUS | Status: DC
  Filled 2023-04-16: qty 5

## 2023-04-16 MED ORDER — UMECLIDINIUM BROMIDE 62.5 MCG/ACT IN AEPB
1.0000 | INHALATION_SPRAY | Freq: Every day | RESPIRATORY_TRACT | Status: DC
  Administered 2023-04-16: 1 via RESPIRATORY_TRACT
  Filled 2023-04-16: qty 7

## 2023-04-16 MED ORDER — ACETAMINOPHEN 650 MG RE SUPP: 650.0000 mg | Freq: Four times a day (QID) | RECTAL | Status: DC | PRN

## 2023-04-16 MED ORDER — BUSPIRONE HCL 10 MG PO TABS
5.0000 mg | ORAL_TABLET | Freq: Two times a day (BID) | ORAL | Status: DC
  Administered 2023-04-16: 5 mg via ORAL
  Filled 2023-04-16: qty 1

## 2023-04-16 MED ORDER — VANCOMYCIN HCL 1750 MG/350ML IV SOLN: 1750.0000 mg | Freq: Once | INTRAVENOUS | Status: DC

## 2023-04-16 MED ORDER — LACTATED RINGERS IV SOLN: INTRAVENOUS | Status: DC

## 2023-04-16 MED ORDER — HYDROCODONE-ACETAMINOPHEN 5-325 MG PO TABS
1.0000 | ORAL_TABLET | ORAL | Status: DC | PRN
  Administered 2023-04-16 (×2): 1 via ORAL
  Filled 2023-04-16 (×2): qty 1

## 2023-04-16 MED ORDER — SODIUM CHLORIDE 0.9 % IV SOLN
500.0000 mg | INTRAVENOUS | Status: DC
  Administered 2023-04-16: 500 mg via INTRAVENOUS

## 2023-04-16 MED ORDER — ALBUTEROL SULFATE (2.5 MG/3ML) 0.083% IN NEBU: 2.5000 mg | INHALATION_SOLUTION | RESPIRATORY_TRACT | Status: DC | PRN

## 2023-04-16 MED ORDER — SODIUM CHLORIDE 0.9 % IV SOLN
2.0000 g | Freq: Once | INTRAVENOUS | Status: AC
  Administered 2023-04-16: 2 g via INTRAVENOUS
  Filled 2023-04-16: qty 20

## 2023-04-16 MED ORDER — CEPHALEXIN 500 MG PO CAPS
500.0000 mg | ORAL_CAPSULE | Freq: Once | ORAL | Status: AC
  Administered 2023-04-16: 500 mg via ORAL
  Filled 2023-04-16: qty 1

## 2023-04-16 MED ORDER — FLUTICASONE FUROATE-VILANTEROL 100-25 MCG/ACT IN AEPB
1.0000 | INHALATION_SPRAY | Freq: Every day | RESPIRATORY_TRACT | Status: DC
  Administered 2023-04-16: 1 via RESPIRATORY_TRACT
  Filled 2023-04-16: qty 28

## 2023-04-16 MED ORDER — INSULIN GLARGINE-YFGN 100 UNIT/ML ~~LOC~~ SOLN
20.0000 [IU] | Freq: Every day | SUBCUTANEOUS | Status: DC
  Filled 2023-04-16: qty 0.2

## 2023-04-16 MED ORDER — LOSARTAN POTASSIUM 25 MG PO TABS
12.5000 mg | ORAL_TABLET | Freq: Every day | ORAL | Status: DC
  Administered 2023-04-16: 12.5 mg via ORAL
  Filled 2023-04-16: qty 1

## 2023-04-16 MED ORDER — FUROSEMIDE 20 MG PO TABS
20.0000 mg | ORAL_TABLET | Freq: Every day | ORAL | Status: DC
  Administered 2023-04-16: 20 mg via ORAL
  Filled 2023-04-16: qty 1

## 2023-04-16 MED ORDER — SODIUM CHLORIDE 0.9 % IV SOLN: 2.0000 g | INTRAVENOUS | Status: DC

## 2023-04-16 MED ORDER — ONDANSETRON HCL 4 MG/2ML IJ SOLN: 4.0000 mg | Freq: Four times a day (QID) | INTRAMUSCULAR | Status: DC | PRN

## 2023-04-16 MED ORDER — VANCOMYCIN HCL 1250 MG/250ML IV SOLN: 1250.0000 mg | INTRAVENOUS | Status: DC

## 2023-04-16 MED ORDER — METOPROLOL SUCCINATE ER 25 MG PO TB24
12.5000 mg | ORAL_TABLET | Freq: Every day | ORAL | Status: DC
  Administered 2023-04-16: 12.5 mg via ORAL
  Filled 2023-04-16: qty 1

## 2023-04-16 MED ORDER — FENTANYL CITRATE PF 50 MCG/ML IJ SOSY
50.0000 ug | PREFILLED_SYRINGE | Freq: Once | INTRAMUSCULAR | Status: AC
  Administered 2023-04-16: 50 ug via INTRAVENOUS
  Filled 2023-04-16: qty 1

## 2023-04-16 MED ORDER — IOHEXOL 300 MG/ML  SOLN
100.0000 mL | Freq: Once | INTRAMUSCULAR | Status: AC | PRN
  Administered 2023-04-16: 100 mL via INTRAVENOUS

## 2023-04-16 MED ORDER — ONDANSETRON HCL 4 MG PO TABS: 4.0000 mg | ORAL_TABLET | Freq: Four times a day (QID) | ORAL | Status: DC | PRN

## 2023-04-16 MED ORDER — ONDANSETRON HCL 4 MG/2ML IJ SOLN
4.0000 mg | Freq: Once | INTRAMUSCULAR | Status: AC
  Administered 2023-04-16: 4 mg via INTRAVENOUS
  Filled 2023-04-16: qty 2

## 2023-04-16 MED ORDER — ATORVASTATIN CALCIUM 20 MG PO TABS
40.0000 mg | ORAL_TABLET | Freq: Every day | ORAL | Status: DC
  Administered 2023-04-16: 40 mg via ORAL
  Filled 2023-04-16: qty 2

## 2023-04-16 MED ORDER — VENLAFAXINE HCL ER 150 MG PO CP24
150.0000 mg | ORAL_CAPSULE | Freq: Every day | ORAL | Status: DC
  Administered 2023-04-16: 150 mg via ORAL
  Filled 2023-04-16: qty 1

## 2023-04-16 MED ORDER — MORPHINE SULFATE (PF) 2 MG/ML IV SOLN
2.0000 mg | INTRAVENOUS | Status: DC | PRN
  Administered 2023-04-16: 2 mg via INTRAVENOUS
  Filled 2023-04-16: qty 1

## 2023-04-16 MED ORDER — VANCOMYCIN HCL 1500 MG/300ML IV SOLN
1500.0000 mg | Freq: Once | INTRAVENOUS | Status: AC
  Administered 2023-04-16: 1500 mg via INTRAVENOUS
  Filled 2023-04-16: qty 300

## 2023-04-16 MED ORDER — ACETAMINOPHEN 325 MG PO TABS: 650.0000 mg | ORAL_TABLET | Freq: Four times a day (QID) | ORAL | Status: DC | PRN

## 2023-04-16 MED ORDER — VANCOMYCIN HCL 1750 MG/350ML IV SOLN: 1750.0000 mg | INTRAVENOUS | Status: DC

## 2023-04-16 MED ORDER — LACTATED RINGERS IV SOLN
150.0000 mL/h | INTRAVENOUS | Status: DC
  Administered 2023-04-16: 150 mL/h via INTRAVENOUS

## 2023-04-16 MED ORDER — HYDROMORPHONE HCL 1 MG/ML IJ SOLN
0.5000 mg | Freq: Once | INTRAMUSCULAR | Status: AC
  Administered 2023-04-16: 0.5 mg via INTRAVENOUS
  Filled 2023-04-16: qty 0.5

## 2023-04-16 NOTE — Progress Notes (Signed)
CODE SEPSIS - PHARMACY COMMUNICATION  **Broad Spectrum Antibiotics should be administered within 1 hour of Sepsis diagnosis**  Time Code Sepsis Called/Page Received: 9/12 @ 0233  Antibiotics Ordered: Cephalexin 500 mg, ceftriaxone, azithromycin   Time of 1st antibiotic administration: Cephalexin 500 mg PO X 1 on 9/12 @ 0216   Additional action taken by pharmacy:   If necessary, Name of Provider/Nurse Contacted:     Edan Juday D ,PharmD Clinical Pharmacist  04/16/2023  2:43 AM

## 2023-04-16 NOTE — Progress Notes (Signed)
Pharmacy Antibiotic Note  Jordan Chan is a 72 y.o. female admitted on 04/15/2023 with  wound infection .  Pharmacy has been consulted for Vancomycin dosing.  Plan: Vancomycin 1500 mg IV X 1 ordered in ED on 9/12 @ ~ 0400. Vancomycin 1750 mg IV Q48H ordered to start on 9/14 @ ~ 0400.  AUC = 495.8 Vanc trough = 7.9  Weight: 64.9 kg (143 lb 1.3 oz)  Temp (24hrs), Avg:97.9 F (36.6 C), Min:97.8 F (36.6 C), Max:97.9 F (36.6 C)  Recent Labs  Lab 04/15/23 2215  WBC 17.6*  CREATININE 0.85  LATICACIDVEN 1.5    Estimated Creatinine Clearance: 47.6 mL/min (by C-G formula based on SCr of 0.85 mg/dL).    Allergies  Allergen Reactions   Other Palpitations    IV steroids   Pain Patch [Menthol] Anaphylaxis   Avelox [Moxifloxacin Hcl In Nacl] Other (See Comments)    Upset stomach   Doxycycline Diarrhea   Erythromycin Nausea Only and Other (See Comments)    Can take a Z-Pak just fine Other reaction(s): Other (See Comments) Can take Z-Pak  Can take a Z-Pak just fine   Fentanyl Nausea Only and Rash   Moxifloxacin Hcl Other (See Comments)    Upset stomach Upset stomach   Oxycontin [Oxycodone] Hives   Ozempic [Semaglutide] Nausea Only    Antimicrobials this admission:   >>    >>   Dose adjustments this admission:   Microbiology results:  BCx:   UCx:    Sputum:    MRSA PCR:   Thank you for allowing pharmacy to be a part of this patient's care.  [name] D 04/16/2023 3:30 AM

## 2023-04-16 NOTE — Progress Notes (Signed)
Patient diaphoretic with noted with tremors, tachypnea, tachycardia, and reports "feeling bad." Patient encouraged to slow her breathing, MD notified. Pt is transferring hospitals, no new orders at this time. CBG, BP, mental status remain within normal limits.    04/16/23 1326  Assess: MEWS Score  Temp 97.9 F (36.6 C)  BP (!) 144/77  MAP (mmHg) 90  Pulse Rate (!) 103  Resp (!) 35  Level of Consciousness Alert  SpO2 100 %  O2 Device Room Air  Assess: MEWS Score  MEWS Temp 0  MEWS Systolic 0  MEWS Pulse 1  MEWS RR 2  MEWS LOC 0  MEWS Score 3  MEWS Score Color Yellow  Assess: if the MEWS score is Yellow or Red  Were vital signs accurate and taken at a resting state? Yes  Does the patient meet 2 or more of the SIRS criteria? Yes  Does the patient have a confirmed or suspected source of infection? Yes  MEWS guidelines implemented  Yes, yellow  Treat  MEWS Interventions Considered administering scheduled or prn medications/treatments as ordered  Take Vital Signs  Increase Vital Sign Frequency  Yellow: Q2hr x1, continue Q4hrs until patient remains green for 12hrs  Escalate  MEWS: Escalate Yellow: Discuss with charge nurse and consider notifying provider and/or RRT  Notify: Charge Nurse/RN  Name of Charge Nurse/RN Notified Eboni RN  Provider Notification  Provider Name/Title Dr. Myriam Forehand  Date Provider Notified 04/16/23  Time Provider Notified 1329  Method of Notification Face-to-face  Notification Reason Change in status  Provider response At bedside  Date of Provider Response 04/16/23  Time of Provider Response 1330  Notify: Rapid Response  Name of Rapid Response RN Notified n/a  Assess: SIRS CRITERIA  SIRS Temperature  0  SIRS Pulse 1  SIRS Respirations  1  SIRS WBC 0  SIRS Score Sum  2

## 2023-04-16 NOTE — Plan of Care (Signed)
  Problem: Skin Integrity: Goal: Risk for impaired skin integrity will decrease Outcome: Progressing   

## 2023-04-16 NOTE — Assessment & Plan Note (Signed)
Basal insulin with sliding scale coverage

## 2023-04-16 NOTE — Assessment & Plan Note (Signed)
Possible pneumonia seen on chest x-ray Continue ceftriaxone and azithromycin Follow COVID swab

## 2023-04-16 NOTE — Discharge Summary (Signed)
Physician Discharge Summary   Patient: Jordan Chan MRN: [id number] DOB: 07-11-1951  Admit date:     04/15/2023  Discharge date: 04/16/23  Discharge Physician: Lurene Shadow   PCP: Marjie Skiff, NP   Recommendations at discharge:   Follow-up with Dr. Genoveva Ill, hospitalist, at [facility] within 12 hours of discharge Follow-up with Dr. Cassell Smiles, orthopedic surgeon, at [facility] within 12 hours of discharge  Discharge Diagnoses: Principal Problem:   Surgical wound infection, left hip Active Problems:   Sepsis (HCC)   Status post total hip replacement, left   Pneumonia   Insulin dependent type 2 diabetes mellitus (HCC)   Chronic HFrEF (heart failure with reduced ejection fraction) (HCC)   History of breast cancer   COPD, moderate (HCC)   Nonischemic cardiomyopathy (HCC)   Persistent atrial fibrillation (HCC)  Resolved Problems:   * No resolved hospital problems. *  Hospital Course:  Jordan Chan is a 72 y.o. female with medical history significant for COPD on nocturnal oxygen due to intolerance to CPAP or BiPAP, chronic atrial fibrillation on Eliquis, HFrEF, DM on insulin, HTN, hx breast cancer, who underwent left total hip arthroplasty 2 weeks prior to admission.  She was evaluated by the orthopedic surgeon in the office on 04/14/2023 and her staples were removed and Steri-Strips were applied to her left hip surgical wound. She presented to the hospital because of worsening of left hip pain at the surgical site associated with increasing redness about the left hip surgical wound.  She also complained of feeling "hot and cold".  She was afebrile in the emergency department but she was tachycardic with heart rate up to 120s.  She had leukocytosis with WBC of 17.6.  Urinalysis was unremarkable.  Chest x-ray was not convincing for pneumonia and patient did not have any respiratory symptoms.  She was admitted to the  hospital for sepsis probably from left hip surgical wound infection.  She was treated with IV fluids, analgesics and empiric IV antibiotics.  Case was discussed with Dr. Odis Luster, orthopedic surgeon.  Unfortunately, Dr. Odis Luster no longer has privileges at [facility].  He recommended that patient be transferred to [facility].  Case was discussed with Dr. Genoveva Ill, hospitalist, at [facility] and he has graciously accepted the patient in transfer.  [facility] has been notified to set up transportation for patient to be transferred.  Patient and her daughter, [name], have also been notified about transfer plans and they are agreeable with the plan.  Chelsea, RN, having notified of transfer as well.  EMTALA has been completed.  Patient is deemed stable for transfer.         Consultants: Orthopedic surgeon Procedures performed: None Disposition:  [facility], [place]) Diet recommendation:  Discharge Diet Orders (From admission, onward)     Start     Ordered   04/16/23 0000  Diet - low sodium heart healthy        04/16/23 1347           Cardiac and Carb modified diet DISCHARGE MEDICATION: Allergies as of 04/16/2023       Reactions   Other Palpitations   IV steroids   Pain Patch [menthol] Anaphylaxis   Avelox [moxifloxacin Hcl In Nacl] Other (See Comments)   Upset stomach   Doxycycline Diarrhea   Erythromycin Nausea Only, Other (See Comments)   Can take a Z-Pak just fine Other reaction(s): Other (See Comments)  Can take Z-Pak Can take a Z-Pak just fine   Fentanyl Nausea Only, Rash   Moxifloxacin Hcl Other (See Comments)   Upset stomach Upset stomach   Oxycontin [oxycodone] Hives   Ozempic [semaglutide] Nausea Only        Medication List     STOP taking these medications    nystatin cream Commonly known as: MYCOSTATIN       TAKE these medications    Accu-Chek Aviva Plus test strip Generic drug:  glucose blood TEST THREE TIMES DAILY   Accu-Chek Softclix Lancets lancets TEST BLOOD SUGAR THREE TIMES DAILY   albuterol 108 (90 Base) MCG/ACT inhaler Commonly known as: VENTOLIN HFA Inhale 2 puffs into the lungs every 6 (six) hours as needed for wheezing or shortness of breath.   albuterol (2.5 MG/3ML) 0.083% nebulizer solution Commonly known as: PROVENTIL Take 3 mLs (2.5 mg total) by nebulization every 4 (four) hours as needed for wheezing or shortness of breath.   apixaban 5 MG Tabs tablet Commonly known as: ELIQUIS Take 1 tablet (5 mg total) by mouth 2 (two) times daily.   atorvastatin 40 MG tablet Commonly known as: LIPITOR Take 1 tablet (40 mg total) by mouth daily.   busPIRone 5 MG tablet Commonly known as: BUSPAR Take 1 tablet (5 mg total) by mouth 2 (two) times daily.   cholecalciferol 25 MCG (1000 UNIT) tablet Commonly known as: VITAMIN D3 Take 1,000 Units by mouth daily.   Cyanocobalamin 1000 MCG/ML Kit Inject 1,000 mcg as directed every 30 (thirty) days.   cyclobenzaprine 5 MG tablet Commonly known as: FLEXERIL Take 5 mg by mouth 2 (two) times daily as needed for muscle spasms. Takes very rarely   DropSafe Alcohol Prep 70 % Pads USE TWICE DAILY  WITH  SUGAR  Engineer, production Use to inhaler nebulizer treatments at needed per instructions on nebulizer prescription   folic acid 1 MG tablet Commonly known as: FOLVITE TAKE 1 TABLET EVERY DAY   FreeStyle Libre 2 Reader Devi To check blood sugars 3-4 times daily due to insulin use. DX E11.40   FreeStyle Libre 2 Sensor Misc To check blood sugars 3-4 times daily due to insulin use. DX E11.40   furosemide 40 MG tablet Commonly known as: LASIX Take 0.5 tablets (20 mg total) by mouth daily.   gabapentin 800 MG tablet Commonly known as: NEURONTIN Take 1 tablet (800 mg total) by mouth 3 (three) times daily.   HYDROcodone-acetaminophen 10-325 MG tablet Commonly known as:  NORCO Take 1 tablet by mouth every 4 (four) hours as needed.   insulin lispro 100 UNIT/ML KwikPen Commonly known as: HumaLOG KwikPen Inject 5 Units into the skin 3 (three) times daily before meals. Do not inject insulin if you are not going to eat meal.  Only take before a meal.   losartan 25 MG tablet Commonly known as: COZAAR Take 0.5 tablets (12.5 mg total) by mouth daily.   metFORMIN 1000 MG tablet Commonly known as: GLUCOPHAGE TAKE 1 TABLET TWICE DAILY WITH MEALS   metoprolol succinate 25 MG 24 hr tablet Commonly known as: TOPROL-XL Take 0.5 tablets (12.5 mg total) by mouth daily.   montelukast 10 MG tablet Commonly known as: SINGULAIR Take 1 tablet (10 mg total) by mouth at bedtime.   ondansetron 4 MG tablet Commonly known as: Zofran Take 1 tablet (4 mg total) by mouth daily as needed for nausea or vomiting.   PAIN MANAGEMENT INTRATHECAL (IT) PUMP 1 each  by Intrathecal route. Intrathecal (IT) medication:  Morphine Patient does not remember current. Adjusted every 2.5 months at [facility] in [place].   pantoprazole 40 MG tablet Commonly known as: PROTONIX Take 1 tablet by mouth once daily   Trelegy Ellipta 100-62.5-25 MCG/ACT Aepb Generic drug: Fluticasone-Umeclidin-Vilant INHALE 1 PUFF INTO THE LUNGS DAILY -42 DAY EXPIRATION AFTER OPENING FOIL TRAY   Tresiba FlexTouch 100 UNIT/ML FlexTouch Pen Generic drug: insulin degludec Inject 55 Units into the skin at bedtime.   True Metrix Meter w/Device Kit Use to check blood sugar 4 times a day   Trulicity 4.5 MG/0.5ML Sopn Generic drug: Dulaglutide Inject 4.5 mg as directed once a week.   venlafaxine XR 150 MG 24 hr capsule Commonly known as: EFFEXOR-XR Take 1 capsule (150 mg total) by mouth daily.        Discharge Exam: Filed Weights   04/15/23 2212  Weight: 64.9 kg   GEN: NAD SKIN: Warm and dry EYES: No pallor or icterus ENT: MMM CV: RRR PULM: CTA B, she is tolerating room  air ABD: soft, ND, NT, +BS, + pain pump in the right lower quadrant CNS: AAO x 3, non focal EXT: No edema or tenderness   Condition at discharge: stable  The results of significant diagnostics from this hospitalization (including imaging, microbiology, ancillary and laboratory) are listed below for reference.   Imaging Studies: DG Chest Portable 1 View  Result Date: 04/16/2023 CLINICAL DATA:  Recent hip surgery, presenting with chills and hip pain. EXAM: PORTABLE CHEST 1 VIEW COMPARISON:  January 05, 2023 FINDINGS: The heart size and mediastinal contours are within normal limits. There is mild prominence of the pulmonary vasculature. Mild, diffuse, chronic appearing increased lung markings are also seen with mild superimposed infiltrate within the mid left lung and left lung base. No pleural effusion or pneumothorax is identified. The visualized skeletal structures are unremarkable. IMPRESSION: 1. Findings which may represent mild pulmonary vascular congestion. 2. Mild, diffuse, chronic appearing increased lung markings with mild superimposed infiltrate within the mid left lung and left lung base. Electronically Signed   By: Aram Candela M.D.   On: 04/16/2023 02:19   CT HIP LEFT W CONTRAST  Result Date: 04/16/2023 CLINICAL DATA:  Two weeks status post left total hip arthroplasty, incisional erythema and purulence, left hip pain EXAM: CT OF THE LOWER LEFT EXTREMITY WITH CONTRAST TECHNIQUE: Multidetector CT imaging of the lower left extremity was performed according to the standard protocol following intravenous contrast administration. RADIATION DOSE REDUCTION: This exam was performed according to the departmental dose-optimization program which includes automated exposure control, adjustment of the mA and/or kV according to patient size and/or use of iterative reconstruction technique. CONTRAST:  OMNIPAQUE IOHEXOL 300 MG/ML  SOLN COMPARISON:  None Available. FINDINGS: Bones/Joint/Cartilage  Surgical changes of left total hip arthroplasty are identified. No acute fracture or dislocation. Small ossific fragments are seen surrounding the resection margin of the proximal femur, likely postsurgical in nature. No dislocation. No pathologic osseous erosions or abnormal periosteal reaction. Ligaments Suboptimally assessed by CT. Muscles and Tendons A a right hip effusion is present which extends into the adductor musculature medially, best seen on axial image # 60/4 and coronal image # 61/5. Muscular structures are otherwise unremarkable. Iliopsoas, gluteal, and hamstring tendons appear intact. Soft tissues Mild subcutaneous edema within the proximal left lower extremity laterally. Single surgical skin staple noted within the inguinal crease. No subcutaneous fluid collection identified. Limited images of the pelvis are unremarkable. IMPRESSION: 1.  Surgical changes of left total hip arthroplasty. No acute fracture or dislocation. No CT evidence of hardware complication. 2. Left hip effusion which extends into the adductor musculature medially. This is nonspecific and may be postsurgical in nature, however, superimposed infection cannot be excluded. 3. Mild subcutaneous edema within the proximal left lower extremity laterally. No subcutaneous fluid collection identified. Electronically Signed   By: Helyn Numbers M.D.   On: 04/16/2023 01:48   CT ABDOMEN PELVIS W CONTRAST  Result Date: 04/16/2023 CLINICAL DATA:  Diarrhea, chills and leukocytosis. EXAM: CT ABDOMEN AND PELVIS WITH CONTRAST TECHNIQUE: Multidetector CT imaging of the abdomen and pelvis was performed using the standard protocol following bolus administration of intravenous contrast. RADIATION DOSE REDUCTION: This exam was performed according to the departmental dose-optimization program which includes automated exposure control, adjustment of the mA and/or kV according to patient size and/or use of iterative reconstruction technique. CONTRAST:   OMNIPAQUE IOHEXOL 300 MG/ML  SOLN COMPARISON:  CT abdomen and pelvis 03/14/2023 FINDINGS: Lower chest: No acute abnormality. Hepatobiliary: No focal liver abnormality is seen. No gallstones, gallbladder wall thickening, or biliary dilatation. Pancreas: Unremarkable. No pancreatic ductal dilatation or surrounding inflammatory changes. Spleen: Normal in size without focal abnormality. Adrenals/Urinary Tract: Right renal cysts are present measuring up to 4.4 cm. Left renal cysts are present measuring up to 3.2 cm. There is no hydronephrosis or perinephric fat stranding. The adrenal glands are within normal limits. The bladder is not well evaluated secondary to streak artifact in the pelvis. Stomach/Bowel: Stomach is within normal limits. Appendix appears normal. No evidence of bowel wall thickening, distention, or inflammatory changes. Vascular/Lymphatic: Aortic atherosclerosis. No enlarged abdominal or pelvic lymph nodes. Reproductive: Status post hysterectomy. No adnexal masses. Other: No ascites or focal abdominal wall hernia. Generator or pump is seen in the anterior abdominal wall with catheter extending into the central spinal canal of the thoracic level. Right mastectomy changes. Musculoskeletal: Left hip arthroplasty is present. No acute findings. IMPRESSION: 1. No acute localizing process in the abdomen or pelvis. 2. Bilateral renal cysts. No follow-up imaging recommended. 3. Aortic atherosclerosis. Aortic Atherosclerosis (ICD10-I70.0). Electronically Signed   By: Darliss Cheney M.D.   On: 04/16/2023 01:41    Microbiology: Results for orders placed or performed during the hospital encounter of 04/15/23  SARS Coronavirus 2 by RT PCR (hospital order, performed in [facility] hospital lab) *cepheid single result test* Anterior Nasal Swab     Status: None   Collection Time: 04/15/23 11:50 PM   Specimen: Anterior Nasal Swab  Result Value Ref Range Status   SARS Coronavirus 2 by RT PCR NEGATIVE NEGATIVE  Final    Comment: (NOTE) SARS-CoV-2 target nucleic acids are NOT DETECTED.  The SARS-CoV-2 RNA is generally detectable in upper and lower respiratory specimens during the acute phase of infection. The lowest concentration of SARS-CoV-2 viral copies this assay can detect is 250 copies / mL. A negative result does not preclude SARS-CoV-2 infection and should not be used as the sole basis for treatment or other patient management decisions.  A negative result may occur with improper specimen collection / handling, submission of specimen other than nasopharyngeal swab, presence of viral mutation(s) within the areas targeted by this assay, and inadequate number of viral copies (<250 copies / mL). A negative result must be combined with clinical observations, patient history, and epidemiological information.  Fact Sheet for Patients:   RoadLapTop.co.za  Fact Sheet for Healthcare Providers: http://kim-miller.com/  This test is not yet approved or  cleared by the [place] and has been authorized for detection and/or diagnosis of SARS-CoV-2 by FDA under an Emergency Use Authorization (EUA).  This EUA will remain in effect (meaning this test can be used) for the duration of the COVID-19 declaration under Section 564(b)(1) of the Act, 21 U.S.C. section 360bbb-3(b)(1), unless the authorization is terminated or revoked sooner.  Performed at [facility], [address] [place], [place] [address]   Blood culture (routine x 2)     Status: None (Preliminary result)   Collection Time: 04/16/23  2:40 AM   Specimen: BLOOD  Result Value Ref Range Status   Specimen Description BLOOD  LEFT FOREARM  Final   Special Requests   Final    BOTTLES DRAWN AEROBIC AND ANAEROBIC Blood Culture results may not be optimal due to an inadequate volume of blood received in culture bottles   Culture   Final    NO GROWTH < 12 HOURS Performed at  [facility], [address] [place], [place] [address]    Report Status PENDING  Incomplete  Blood culture (routine x 2)     Status: None (Preliminary result)   Collection Time: 04/16/23  2:40 AM   Specimen: BLOOD  Result Value Ref Range Status   Specimen Description BLOOD  RFA  Final   Special Requests   Final    BOTTLES DRAWN AEROBIC AND ANAEROBIC Blood Culture results may not be optimal due to an inadequate volume of blood received in culture bottles   Culture   Final    NO GROWTH < 12 HOURS Performed at [facility], [address] [place], [place] [address]    Report Status PENDING  Incomplete    Labs: CBC: Recent Labs  Lab 04/15/23 2215 04/16/23 0522  WBC 17.6* 20.0*  NEUTROABS 13.0*  --   HGB 10.5* 10.3*  HCT 32.9* 32.6*  MCV 92.2 91.6  PLT 785* 801*   Basic Metabolic Panel: Recent Labs  Lab 04/15/23 2215  NA 136  K 3.9  CL 99  CO2 24  GLUCOSE 147*  BUN 10  CREATININE 0.85  CALCIUM 9.4   Liver Function Tests: Recent Labs  Lab 04/15/23 2215  AST 17  ALT 11  ALKPHOS 68  BILITOT 0.3  PROT 7.9  ALBUMIN 3.6   CBG: Recent Labs  Lab 04/16/23 0428 04/16/23 0819 04/16/23 1129 04/16/23 1327  GLUCAP 136* 115* 154* 189*    Discharge time spent: greater than 30 minutes.  Signed: Lurene Shadow, MD [facility] 04/16/2023

## 2023-04-16 NOTE — Assessment & Plan Note (Addendum)
S/p left total hip arthroplasty 03/2023 Sepsis Sepsis criteria include chills, tachycardia, leukocytosis and wound infection CT showing "Left hip effusion which extends into the adductor musculature medially. This is nonspecific and may be postsurgical in nature, however, superimposed infection cannot be excluded" Sepsis fluids Vancomycin and Rocephin Pain control Follow blood and wound cultures Orthopedic consult Pain control Will hold Eliquis tonight and place SCDs in case of need to go to the OR

## 2023-04-16 NOTE — Assessment & Plan Note (Signed)
S/p radiation No acute issues suspected

## 2023-04-16 NOTE — Sepsis Progress Note (Signed)
Following for sepsis monitoring ?

## 2023-04-16 NOTE — Assessment & Plan Note (Signed)
Nocturnal oxygen Patient with baseline shortness of breath, no active wheezing Continue home inhalers with DuoNebs as needed Continue supplemental oxygen nightly and as needed

## 2023-04-16 NOTE — Progress Notes (Signed)
Hailey, RN called with report, patient will go to room 206.

## 2023-04-16 NOTE — Progress Notes (Addendum)
Pharmacy Antibiotic Note  Jordan Chan is a 72 y.o. female admitted on 04/15/2023 with  wound infection  and possible pneumonia. She recently had hip arthroplasty 04/02/23.  Complaints of chills at home  Pharmacy has been consulted for Vancomycin dosing.  Today, 04/16/2023 Day #1 vancomycin, ceftriaxone, azithromycin Renal: SCr WNL WBC 20 No fevers Superficial hip culture ordered but no collected COVID neg  Plan: Adjust vancomycin to 1250mg  IV q24h eAUC = 489 (goal 400-600) Using SCr 0.85, used TBW for CrCl, Ke as her IBW looks to underestimate renal function using Cockgroft-Gault CrCl calculation (from short stature), so used a CrCl ~73ml/min which seems more appropriate for age vs 19ml/min if use IBW in this calculation Monitor renal function and check trough if remains on vancomycin > 4-5 days or change in renal function  Follow cultures and orthopedic plan   Height: 4' 10.5" (148.6 cm) Weight: 64.9 kg (143 lb 1.3 oz) IBW/kg (Calculated) : 42.05  Temp (24hrs), Avg:97.8 F (36.6 C), Min:97.5 F (36.4 C), Max:98 F (36.7 C)  Recent Labs  Lab 04/15/23 2215 04/16/23 0522  WBC 17.6* 20.0*  CREATININE 0.85  --   LATICACIDVEN 1.5  --     Estimated Creatinine Clearance: 47.6 mL/min (by C-G formula based on SCr of 0.85 mg/dL).    Allergies  Allergen Reactions   Other Palpitations    IV steroids   Pain Patch [Menthol] Anaphylaxis   Avelox [Moxifloxacin Hcl In Nacl] Other (See Comments)    Upset stomach   Doxycycline Diarrhea   Erythromycin Nausea Only and Other (See Comments)    Can take a Z-Pak just fine Other reaction(s): Other (See Comments) Can take Z-Pak  Can take a Z-Pak just fine   Fentanyl Nausea Only and Rash   Moxifloxacin Hcl Other (See Comments)    Upset stomach Upset stomach   Oxycontin [Oxycodone] Hives   Ozempic [Semaglutide] Nausea Only    Antimicrobials this admission: 9/12 vancomycin >> 9/12 ceftriaxone >> Azithromycin >>   Dose  adjustments this admission:   Microbiology results:  9/12BCx: NGTD  9/12 superficial wound cx: ordered    Thank you for allowing pharmacy to be a part of this patient's care.  Juliette Alcide, PharmD, BCPS, BCIDP Work Cell: [phone] 04/16/2023 9:51 AM

## 2023-04-16 NOTE — Assessment & Plan Note (Signed)
Continue metoprolol and apixaban

## 2023-04-16 NOTE — Assessment & Plan Note (Addendum)
Nonischemic cardiomyopathy (EF 45 to 50% 10/2022) Clinically euvolemic Continue furosemide, losartan and metoprolol Monitor for fluid overload with sepsis fluids Daily weights

## 2023-04-16 NOTE — H&P (Signed)
History and Physical    Patient: Jordan Chan NWG:[id number] DOB: 29-Dec-1950 DOA: 04/15/2023 DOS: the patient was seen and examined on 04/16/2023 PCP: Marjie Skiff, NP  Patient coming from: Home  Chief Complaint:  Chief Complaint  Patient presents with   Hip Pain   Chills    HPI: Jordan Chan is a 72 y.o. female with medical history significant for COPD on nocturnal oxygen due to intolerance to CPAP or BiPAP, chronic atrial fibrillation on Eliquis, HFrEF, DM on insulin, HTN, hx breast cancer, who underwent left total hip arthroplasty 2 weeks prior , who presents to the ED with shaking chills.  Patient was seen by her orthopedist the day prior on 9/10 and had all but 1 staple removed and Steri-Strips were placed.  Patient and daughter at bedside report an area of moisture in the upper part of the wound that was concerning to them.  She has associated increasing pain to the left hip even though she was initially doing well following the procedure.  She is still able to ambulate with the assistance of her walker.  She denies cough.  Has shortness of breath which is her baseline for her COPD.  Denies chest pain, nausea, abdominal pain, vomiting or diarrhea. ED course and data review: Afebrile, pulse 99, normotensive, O2 sat in the high 90s on room air. Labs notable for WBC 17,000 with lactic acid 1.5, procalcitonin less than 0.10 COVID-negative. CMP unremarkable Hemoglobin 10.5 down from 12.1 a month prior. CT left hip shows postsurgical changes related to left total hip arthroplasty with no evidence of hardware complication.  Does show a left hip effusion further detailed below: IMPRESSION: 1. Surgical changes of left total hip arthroplasty. No acute fracture or dislocation. No CT evidence of hardware complication. 2. Left hip effusion which extends into the adductor musculature medially. This is nonspecific and may be postsurgical in nature, however, superimposed infection  cannot be excluded. 3. Mild subcutaneous edema within the proximal left lower extremity laterally. No subcutaneous fluid collection identified.  CT abdomen and pelvis with no acute localizing process  Chest x-ray showing the following IMPRESSION: 1. Findings which may represent mild pulmonary vascular congestion. 2. Mild, diffuse, chronic appearing increased lung markings with mild superimposed infiltrate within the mid left lung and left lung base.    Patient treated with Tylenol, started on sepsis fluids as well as ceftriaxone, azithromycin and vancomycin and Dilaudid for pain Hospitalist consulted for admission.   Review of Systems: As mentioned in the history of present illness. All other systems reviewed and are negative.  Past Medical History:  Diagnosis Date   Arthritis    Asthma    Atrial fibrillation (HCC)    Breast cancer (HCC) 1998   right breast ca with mastectomy and chemotherapy and radiation   Bronchitis    CHF (congestive heart failure) (HCC)    "with Morphine withdrawal"   COPD (chronic obstructive pulmonary disease) (HCC)    Diabetes mellitus without complication (HCC)    Diverticulitis    diverticulosis also   Dyspnea    Endometriosis    GERD (gastroesophageal reflux disease)    History of shingles 2000-2005   Hypercholesteremia    Hypertension    IBS (irritable bowel syndrome)    Low back pain    a. Implanted morphine/bupivicaine/clonidine pump.   Neuropathy    Orthopnea    Oxygen dependent    uses at night   Personal history of chemotherapy    Personal history of radiation  therapy    Pneumonia    pneumonia 5-6 times, history of bronchitis also   Scoliosis    Sleep apnea    does not use cpap   Stroke (HCC) 2010   TIA, 10 years ago   Withdrawal from sedative drug (HCC)    withdrawal from morphine when pump batteries died   Past Surgical History:  Procedure Laterality Date   ABDOMINAL HYSTERECTOMY  1987   BACK SURGERY     Tailbone  removed following fracture   BREAST SURGERY Right    mastectomy   CARDIAC CATHETERIZATION     CATARACT EXTRACTION W/PHACO Right 05/19/2019   Procedure: CATARACT EXTRACTION PHACO AND INTRAOCULAR LENS PLACEMENT (IOC), RIGHT, DIABETIC;  Surgeon: Elliot Cousin, MD;  Location: [facility] ORS;  Service: Ophthalmology;  Laterality: Right;  Lot # [id number] H [place]: 00:43.8 CDE: 4.59   CATARACT EXTRACTION W/PHACO Left 06/16/2019   Procedure: CATARACT EXTRACTION PHACO AND INTRAOCULAR LENS PLACEMENT (IOC) LEFT VISION BLUE DIABETIC;  Surgeon: Elliot Cousin, MD;  Location: [facility] ORS;  Service: Ophthalmology;  Laterality: Left;  Lot #[id number] H [place]: 00:46.9 CDE: 6.53   COCCYX REMOVAL     COLONOSCOPY WITH PROPOFOL N/A 01/01/2017   Procedure: COLONOSCOPY WITH PROPOFOL;  Surgeon: Wyline Mood, MD;  Location: [facility] ENDOSCOPY;  Service: Endoscopy;  Laterality: N/A;   COLONOSCOPY WITH PROPOFOL N/A 12/11/2021   Procedure: COLONOSCOPY WITH PROPOFOL;  Surgeon: Toney Reil, MD;  Location: [facility] ENDOSCOPY;  Service: Gastroenterology;  Laterality: N/A;   ELBOW ARTHROSCOPY WITH TENDON RECONSTRUCTION     ESOPHAGOGASTRODUODENOSCOPY (EGD) WITH PROPOFOL N/A 01/01/2017   Procedure: ESOPHAGOGASTRODUODENOSCOPY (EGD) WITH PROPOFOL;  Surgeon: Wyline Mood, MD;  Location: [facility] ENDOSCOPY;  Service: Endoscopy;  Laterality: N/A;   ESOPHAGOGASTRODUODENOSCOPY (EGD) WITH PROPOFOL N/A 12/11/2021   Procedure: ESOPHAGOGASTRODUODENOSCOPY (EGD) WITH PROPOFOL;  Surgeon: Toney Reil, MD;  Location: [facility] ENDOSCOPY;  Service: Gastroenterology;  Laterality: N/A;   INTRATHECAL PUMP IMPLANT     LEFT HEART CATH AND CORONARY ANGIOGRAPHY N/A 04/28/2017   Procedure: LEFT HEART CATH AND CORONARY ANGIOGRAPHY;  Surgeon: Yvonne Kendall, MD;  Location: [facility] INVASIVE CV LAB;  Service: Cardiovascular;  Laterality: N/A;   MASTECTOMY Right 06/1997   morphine pump  2011   PLANTAR FASCIA RELEASE     TRIGGER FINGER RELEASE     Social History:  reports that she  quit smoking about 18 years ago. Her smoking use included cigarettes. She started smoking about 48 years ago. She has a 30 pack-year smoking history. She has never used smokeless tobacco. She reports that she does not drink alcohol and does not use drugs.  Allergies  Allergen Reactions   Other Palpitations    IV steroids   Pain Patch [Menthol] Anaphylaxis   Avelox [Moxifloxacin Hcl In Nacl] Other (See Comments)    Upset stomach   Doxycycline Diarrhea   Erythromycin Nausea Only and Other (See Comments)    Can take a Z-Pak just fine Other reaction(s): Other (See Comments) Can take Z-Pak  Can take a Z-Pak just fine   Fentanyl Nausea Only and Rash   Moxifloxacin Hcl Other (See Comments)    Upset stomach Upset stomach   Oxycontin [Oxycodone] Hives   Ozempic [Semaglutide] Nausea Only    Family History  Problem Relation Age of Onset   Cancer Mother 36       lung   Thyroid disease Mother    Cancer Father 84       Pancreatic   Hypertension Sister    Cancer Sister        "  cancer on face"   Hyperlipidemia Sister    Osteoporosis Maternal Grandmother    Cancer Paternal Grandmother        colon   Arthritis Paternal Grandfather    Hyperlipidemia Son    Seizures Son    Breast cancer Neg Hx     Prior to Admission medications   Medication Sig Start Date End Date Taking? Authorizing Provider  ACCU-CHEK AVIVA PLUS test strip TEST THREE TIMES DAILY 05/11/21   [name], Corrie Dandy T, NP  Accu-Chek Softclix Lancets lancets TEST BLOOD SUGAR THREE TIMES DAILY 02/26/23   [name], Corrie Dandy T, NP  albuterol (PROVENTIL HFA;VENTOLIN HFA) 108 (90 BASE) MCG/ACT inhaler Inhale 2 puffs into the lungs every 6 (six) hours as needed for wheezing or shortness of breath.     [provider]  albuterol (PROVENTIL) (2.5 MG/3ML) 0.083% nebulizer solution Take 3 mLs (2.5 mg total) by nebulization every 4 (four) hours as needed for wheezing or shortness of breath. 07/18/22 07/18/23  Dorcas Carrow, MD  Alcohol  Swabs (DROPSAFE ALCOHOL PREP) 70 % PADS USE TWICE DAILY  WITH  SUGAR  CHECKS 11/14/21   [name], Corrie Dandy T, NP  apixaban (ELIQUIS) 5 MG TABS tablet Take 1 tablet (5 mg total) by mouth 2 (two) times daily. 06/24/22   [name], Corrie Dandy T, NP  atorvastatin (LIPITOR) 40 MG tablet Take 1 tablet (40 mg total) by mouth daily. 06/25/22   [name], Corrie Dandy T, NP  Blood Glucose Monitoring Suppl (TRUE METRIX METER) w/Device KIT Use to check blood sugar 4 times a day 05/14/21   [name], Corrie Dandy T, NP  busPIRone (BUSPAR) 5 MG tablet Take 1 tablet (5 mg total) by mouth 2 (two) times daily. 05/20/22   [name], Corrie Dandy T, NP  cholecalciferol (VITAMIN D3) 25 MCG (1000 UNIT) tablet Take 1,000 Units by mouth daily.    [provider]  Continuous Glucose Receiver (FREESTYLE LIBRE 2 READER) DEVI To check blood sugars 3-4 times daily due to insulin use. DX E11.40 12/11/22   [name], Corrie Dandy T, NP  Continuous Glucose Sensor (FREESTYLE LIBRE 2 SENSOR) MISC To check blood sugars 3-4 times daily due to insulin use. DX E11.40 12/30/22   [name], Corrie Dandy T, NP  Cyanocobalamin 1000 MCG/ML KIT Inject 1,000 mcg as directed every 30 (thirty) days.    [provider]  cyclobenzaprine (FLEXERIL) 5 MG tablet Take 5 mg by mouth 2 (two) times daily as needed for muscle spasms. Takes very rarely    [provider]  Dulaglutide (TRULICITY) 4.5 MG/0.5ML SOPN Inject 4.5 mg as directed once a week. 03/04/22   [name], Corrie Dandy T, NP  Fluticasone-Umeclidin-Vilant (TRELEGY ELLIPTA) 100-62.5-25 MCG/ACT AEPB INHALE 1 PUFF INTO THE LUNGS DAILY -42 DAY EXPIRATION AFTER OPENING FOIL TRAY 02/13/23   Aura Dials T, NP  folic acid (FOLVITE) 1 MG tablet TAKE 1 TABLET EVERY DAY 10/14/21   Alinda Dooms, NP  furosemide (LASIX) 40 MG tablet Take 0.5 tablets (20 mg total) by mouth daily. 10/22/22   End, Cristal Deer, MD  gabapentin (NEURONTIN) 800 MG tablet Take 1 tablet (800 mg total) by mouth 3 (three) times daily. 12/12/21   [name], Corrie Dandy T,  NP  insulin degludec (TRESIBA FLEXTOUCH) 100 UNIT/ML FlexTouch Pen Inject 55 Units into the skin at bedtime. 09/26/22   [name], Corrie Dandy T, NP  insulin lispro (HUMALOG KWIKPEN) 100 UNIT/ML KwikPen Inject 5 Units into the skin 3 (three) times daily before meals. Do not inject insulin if you are not going to eat meal.  Only take before  a meal. 12/11/22   [name], Corrie Dandy T, NP  losartan (COZAAR) 25 MG tablet Take 0.5 tablets (12.5 mg total) by mouth daily. 06/24/22   [name], Corrie Dandy T, NP  metFORMIN (GLUCOPHAGE) 1000 MG tablet TAKE 1 TABLET TWICE DAILY WITH MEALS 12/04/21   [name], Corrie Dandy T, NP  metoprolol succinate (TOPROL-XL) 25 MG 24 hr tablet Take 0.5 tablets (12.5 mg total) by mouth daily. 06/24/22   [name], Corrie Dandy T, NP  montelukast (SINGULAIR) 10 MG tablet Take 1 tablet (10 mg total) by mouth at bedtime. 06/24/22   Aura Dials T, NP  Nebulizers (EASY AIR COMPRESSOR NEBULIZER) MISC Use to inhaler nebulizer treatments at needed per instructions on nebulizer prescription 07/21/22   [name], Corrie Dandy T, NP  nystatin cream (MYCOSTATIN) Apply 1 Application topically 2 (two) times daily. 02/18/23   [name], Corrie Dandy T, NP  ondansetron (ZOFRAN) 4 MG tablet Take 1 tablet (4 mg total) by mouth daily as needed for nausea or vomiting. 03/14/23 03/13/24  Pilar Jarvis, MD  PAIN MANAGEMENT INTRATHECAL, IT, PUMP 1 each by Intrathecal route. Intrathecal (IT) medication:  Morphine Patient does not remember current. Adjusted every 2.5 months at [facility] in [place].    [provider]  pantoprazole (PROTONIX) 40 MG tablet Take 1 tablet by mouth once daily 02/25/23   [name], Corrie Dandy T, NP  venlafaxine XR (EFFEXOR-XR) 150 MG 24 hr capsule Take 1 capsule (150 mg total) by mouth daily. 06/24/22   Aura Dials T, NP    Physical Exam: Vitals:   04/15/23 2210 04/15/23 2212 04/16/23 0036 04/16/23 0217  BP: 139/62   133/69  Pulse: 77  76 99  Resp: 19   18  Temp: 97.8 F (36.6 C)   97.9 F  (36.6 C)  TempSrc: Oral   Oral  SpO2: 95%  99% 98%  Weight:  64.9 kg     Physical Exam Vitals and nursing note reviewed.  Constitutional:      General: She is not in acute distress. HENT:     Head: Normocephalic and atraumatic.  Cardiovascular:     Rate and Rhythm: Normal rate and regular rhythm.     Heart sounds: Normal heart sounds.  Pulmonary:     Effort: Pulmonary effort is normal.     Breath sounds: Normal breath sounds.  Abdominal:     Palpations: Abdomen is soft.     Tenderness: There is no abdominal tenderness.  Musculoskeletal:     Comments: See picture below Indurated swelling upper anteromedial thigh under surgical wound [place]  Neurological:     Mental Status: Mental status is at baseline.      However you are feeling is probably right small yeah but you know I do not feel  Labs on Admission: I have personally reviewed following labs and imaging studies  CBC: Recent Labs  Lab 04/15/23 2215  WBC 17.6*  NEUTROABS 13.0*  HGB 10.5*  HCT 32.9*  MCV 92.2  PLT 785*   Basic Metabolic Panel: Recent Labs  Lab 04/15/23 2215  NA 136  K 3.9  CL 99  CO2 24  GLUCOSE 147*  BUN 10  CREATININE 0.85  CALCIUM 9.4   GFR: Estimated Creatinine Clearance: 47.6 mL/min (by C-G formula based on SCr of 0.85 mg/dL). Liver Function Tests: Recent Labs  Lab 04/15/23 2215  AST 17  ALT 11  ALKPHOS 68  BILITOT 0.3  PROT 7.9  ALBUMIN 3.6   No results for input(s): "LIPASE", "AMYLASE" in the last 168 hours. No  results for input(s): "AMMONIA" in the last 168 hours. Coagulation Profile: No results for input(s): "INR", "PROTIME" in the last 168 hours. Cardiac Enzymes: No results for input(s): "CKTOTAL", "CKMB", "CKMBINDEX", "TROPONINI" in the last 168 hours. BNP (last 3 results) No results for input(s): "PROBNP" in the last 8760 hours. HbA1C: No results for input(s): "HGBA1C" in the last 72 hours. CBG: No results for input(s): "GLUCAP" in the last 168  hours. Lipid Profile: No results for input(s): "CHOL", "HDL", "LDLCALC", "TRIG", "CHOLHDL", "LDLDIRECT" in the last 72 hours. Thyroid Function Tests: No results for input(s): "TSH", "T4TOTAL", "FREET4", "T3FREE", "THYROIDAB" in the last 72 hours. Anemia Panel: No results for input(s): "VITAMINB12", "FOLATE", "FERRITIN", "TIBC", "IRON", "RETICCTPCT" in the last 72 hours. Urine analysis:    Component Value Date/Time   COLORURINE YELLOW (A) 04/15/2023 2350   APPEARANCEUR CLEAR (A) 04/15/2023 2350   APPEARANCEUR Clear 02/17/2023 1440   LABSPEC 1.015 04/15/2023 2350   PHURINE 7.0 04/15/2023 2350   GLUCOSEU NEGATIVE 04/15/2023 2350   HGBUR NEGATIVE 04/15/2023 2350   BILIRUBINUR NEGATIVE 04/15/2023 2350   BILIRUBINUR Negative 02/17/2023 1440   KETONESUR NEGATIVE 04/15/2023 2350   PROTEINUR NEGATIVE 04/15/2023 2350   NITRITE NEGATIVE 04/15/2023 2350   LEUKOCYTESUR NEGATIVE 04/15/2023 2350    Radiological Exams on Admission: DG Chest Portable 1 View  Result Date: 04/16/2023 CLINICAL DATA:  Recent hip surgery, presenting with chills and hip pain. EXAM: PORTABLE CHEST 1 VIEW COMPARISON:  January 05, 2023 FINDINGS: The heart size and mediastinal contours are within normal limits. There is mild prominence of the pulmonary vasculature. Mild, diffuse, chronic appearing increased lung markings are also seen with mild superimposed infiltrate within the mid left lung and left lung base. No pleural effusion or pneumothorax is identified. The visualized skeletal structures are unremarkable. IMPRESSION: 1. Findings which may represent mild pulmonary vascular congestion. 2. Mild, diffuse, chronic appearing increased lung markings with mild superimposed infiltrate within the mid left lung and left lung base. Electronically Signed   By: Aram Candela M.D.   On: 04/16/2023 02:19   CT HIP LEFT W CONTRAST  Result Date: 04/16/2023 CLINICAL DATA:  Two weeks status post left total hip arthroplasty, incisional  erythema and purulence, left hip pain EXAM: CT OF THE LOWER LEFT EXTREMITY WITH CONTRAST TECHNIQUE: Multidetector CT imaging of the lower left extremity was performed according to the standard protocol following intravenous contrast administration. RADIATION DOSE REDUCTION: This exam was performed according to the departmental dose-optimization program which includes automated exposure control, adjustment of the mA and/or kV according to patient size and/or use of iterative reconstruction technique. CONTRAST:  OMNIPAQUE IOHEXOL 300 MG/ML  SOLN COMPARISON:  None Available. FINDINGS: Bones/Joint/Cartilage Surgical changes of left total hip arthroplasty are identified. No acute fracture or dislocation. Small ossific fragments are seen surrounding the resection margin of the proximal femur, likely postsurgical in nature. No dislocation. No pathologic osseous erosions or abnormal periosteal reaction. Ligaments Suboptimally assessed by CT. Muscles and Tendons A a right hip effusion is present which extends into the adductor musculature medially, best seen on axial image # 60/4 and coronal image # 61/5. Muscular structures are otherwise unremarkable. Iliopsoas, gluteal, and hamstring tendons appear intact. Soft tissues Mild subcutaneous edema within the proximal left lower extremity laterally. Single surgical skin staple noted within the inguinal crease. No subcutaneous fluid collection identified. Limited images of the pelvis are unremarkable. IMPRESSION: 1. Surgical changes of left total hip arthroplasty. No acute fracture or dislocation. No CT evidence of hardware complication. 2.  Left hip effusion which extends into the adductor musculature medially. This is nonspecific and may be postsurgical in nature, however, superimposed infection cannot be excluded. 3. Mild subcutaneous edema within the proximal left lower extremity laterally. No subcutaneous fluid collection identified. Electronically Signed   By: Helyn Numbers M.D.   On: 04/16/2023 01:48   CT ABDOMEN PELVIS W CONTRAST  Result Date: 04/16/2023 CLINICAL DATA:  Diarrhea, chills and leukocytosis. EXAM: CT ABDOMEN AND PELVIS WITH CONTRAST TECHNIQUE: Multidetector CT imaging of the abdomen and pelvis was performed using the standard protocol following bolus administration of intravenous contrast. RADIATION DOSE REDUCTION: This exam was performed according to the departmental dose-optimization program which includes automated exposure control, adjustment of the mA and/or kV according to patient size and/or use of iterative reconstruction technique. CONTRAST:  OMNIPAQUE IOHEXOL 300 MG/ML  SOLN COMPARISON:  CT abdomen and pelvis 03/14/2023 FINDINGS: Lower chest: No acute abnormality. Hepatobiliary: No focal liver abnormality is seen. No gallstones, gallbladder wall thickening, or biliary dilatation. Pancreas: Unremarkable. No pancreatic ductal dilatation or surrounding inflammatory changes. Spleen: Normal in size without focal abnormality. Adrenals/Urinary Tract: Right renal cysts are present measuring up to 4.4 cm. Left renal cysts are present measuring up to 3.2 cm. There is no hydronephrosis or perinephric fat stranding. The adrenal glands are within normal limits. The bladder is not well evaluated secondary to streak artifact in the pelvis. Stomach/Bowel: Stomach is within normal limits. Appendix appears normal. No evidence of bowel wall thickening, distention, or inflammatory changes. Vascular/Lymphatic: Aortic atherosclerosis. No enlarged abdominal or pelvic lymph nodes. Reproductive: Status post hysterectomy. No adnexal masses. Other: No ascites or focal abdominal wall hernia. Generator or pump is seen in the anterior abdominal wall with catheter extending into the central spinal canal of the thoracic level. Right mastectomy changes. Musculoskeletal: Left hip arthroplasty is present. No acute findings. IMPRESSION: 1. No acute localizing process in the  abdomen or pelvis. 2. Bilateral renal cysts. No follow-up imaging recommended. 3. Aortic atherosclerosis. Aortic Atherosclerosis (ICD10-I70.0). Electronically Signed   By: Darliss Cheney M.D.   On: 04/16/2023 01:41     Data Reviewed: Relevant notes from primary care and specialist visits, past discharge summaries as available in EHR, including Care Everywhere. Prior diagnostic testing as pertinent to current admission diagnoses Updated medications and problem lists for reconciliation ED course, including vitals, labs, imaging, treatment and response to treatment Triage notes, nursing and pharmacy notes and ED provider's notes Notable results as noted in HPI   Assessment and Plan: * Surgical wound infection, left hip S/p left total hip arthroplasty 03/2023 Sepsis Sepsis criteria include chills, tachycardia, leukocytosis and wound infection CT showing "Left hip effusion which extends into the adductor musculature medially. This is nonspecific and may be postsurgical in nature, however, superimposed infection cannot be excluded" Sepsis fluids Vancomycin and Rocephin Pain control Follow blood and wound cultures Orthopedic consult Pain control Will hold Eliquis tonight and place SCDs in case of need to go to the OR  Pneumonia Possible pneumonia seen on chest x-ray Continue ceftriaxone and azithromycin Follow COVID swab  Insulin dependent type 2 diabetes mellitus (HCC) Basal insulin with sliding scale coverage  Chronic HFrEF (heart failure with reduced ejection fraction) (HCC) Nonischemic cardiomyopathy (EF 45 to 50% 10/2022) Clinically euvolemic Continue furosemide, losartan and metoprolol Monitor for fluid overload with sepsis fluids Daily weights  Persistent atrial fibrillation (HCC) Continue metoprolol and apixaban  COPD, moderate (HCC) Nocturnal oxygen Patient with baseline shortness of breath, no active wheezing Continue home inhalers  with DuoNebs as needed Continue  supplemental oxygen nightly and as needed  History of breast cancer S/p radiation No acute issues suspected     DVT prophylaxis: SCD  Consults: [facility]  Advance Care Planning:   Code Status: Prior   Family Communication: Daughter at bedside  Disposition Plan: Back to previous home environment  Severity of Illness: The appropriate patient status for this patient is INPATIENT. Inpatient status is judged to be reasonable and necessary in order to provide the required intensity of service to ensure the patient's safety. The patient's presenting symptoms, physical exam findings, and initial radiographic and laboratory data in the context of their chronic comorbidities is felt to place them at high risk for further clinical deterioration. Furthermore, it is not anticipated that the patient will be medically stable for discharge from the hospital within 2 midnights of admission.   * I certify that at the point of admission it is my clinical judgment that the patient will require inpatient hospital care spanning beyond 2 midnights from the point of admission due to high intensity of service, high risk for further deterioration and high frequency of surveillance required.*  Author: Andris Baumann, MD 04/16/2023 2:59 AM  For on call review [email or web address].

## 2023-04-17 DIAGNOSIS — I5020 Unspecified systolic (congestive) heart failure: Secondary | ICD-10-CM | POA: Diagnosis not present

## 2023-04-17 DIAGNOSIS — I130 Hypertensive heart and chronic kidney disease with heart failure and stage 1 through stage 4 chronic kidney disease, or unspecified chronic kidney disease: Secondary | ICD-10-CM | POA: Diagnosis not present

## 2023-04-17 DIAGNOSIS — D631 Anemia in chronic kidney disease: Secondary | ICD-10-CM | POA: Diagnosis not present

## 2023-04-17 DIAGNOSIS — G8929 Other chronic pain: Secondary | ICD-10-CM | POA: Diagnosis not present

## 2023-04-17 DIAGNOSIS — D72829 Elevated white blood cell count, unspecified: Secondary | ICD-10-CM | POA: Diagnosis not present

## 2023-04-17 DIAGNOSIS — M545 Low back pain, unspecified: Secondary | ICD-10-CM | POA: Diagnosis not present

## 2023-04-17 DIAGNOSIS — J449 Chronic obstructive pulmonary disease, unspecified: Secondary | ICD-10-CM | POA: Diagnosis not present

## 2023-04-17 DIAGNOSIS — N1831 Chronic kidney disease, stage 3a: Secondary | ICD-10-CM | POA: Diagnosis not present

## 2023-04-17 DIAGNOSIS — I480 Paroxysmal atrial fibrillation: Secondary | ICD-10-CM | POA: Diagnosis not present

## 2023-04-17 LAB — HEMOGLOBIN A1C
Hgb A1c MFr Bld: 6.7 % — ABNORMAL HIGH (ref 4.8–5.6)
Mean Plasma Glucose: 146 mg/dL

## 2023-04-21 LAB — CULTURE, BLOOD (ROUTINE X 2)
Culture: NO GROWTH
Culture: NO GROWTH

## 2023-04-29 ENCOUNTER — Ambulatory Visit: Payer: Medicare HMO | Admitting: Internal Medicine

## 2023-05-01 ENCOUNTER — Other Ambulatory Visit: Payer: Self-pay | Admitting: Nurse Practitioner

## 2023-05-01 NOTE — Telephone Encounter (Signed)
Requested Prescriptions  Pending Prescriptions Disp Refills   TRULICITY 4.5 MG/0.5ML SOPN [Pharmacy Med Name: Trulicity 4.5 MG/0.5ML Subcutaneous Solution Pen-injector] 12 mL 0    Sig: INJECT 4.5MG  INTO THE SKIN ONCE WEEKLY AS DIRECTED     Endocrinology:  Diabetes - GLP-1 Receptor Agonists Passed - 05/01/2023  6:40 AM      Passed - HBA1C is between 0 and 7.9 and within 180 days    Hemoglobin A1C  Date Value Ref Range Status  04/28/2016 7.7%  Final   HB A1C (BAYER DCA - WAIVED)  Date Value Ref Range Status  02/17/2023 7.5 (H) 4.8 - 5.6 % Final    Comment:             Prediabetes: 5.7 - 6.4          Diabetes: >6.4          Glycemic control for adults with diabetes: <7.0    Hgb A1c MFr Bld  Date Value Ref Range Status  04/16/2023 6.7 (H) 4.8 - 5.6 % Final    Comment:    (NOTE)         Prediabetes: 5.7 - 6.4         Diabetes: >6.4         Glycemic control for adults with diabetes: <7.0          Passed - Valid encounter within last 6 months    Recent Outpatient Visits           1 month ago Insulin dependent type 2 diabetes mellitus (HCC)   [place] [facility] [place], [place] T, NP   2 months ago Insulin dependent type 2 diabetes mellitus (HCC)   [place] [facility] [place], [place] T, NP   3 months ago Insulin dependent type 2 diabetes mellitus (HCC)   [facility] [place], [place] T, NP   4 months ago Insulin dependent type 2 diabetes mellitus (HCC)   [place] [facility] [place], Corrie Dandy T, NP   4 months ago Severe sepsis (HCC)   [place] [facility] [place], Dorie Rank, NP       Future Appointments             In 1 month [name], Dorie Rank, NP [place] [facility], [facility]   In 1 month End, Cristal Deer, MD [facility]

## 2023-05-05 ENCOUNTER — Ambulatory Visit: Payer: Self-pay | Admitting: *Deleted

## 2023-05-05 NOTE — Patient Outreach (Signed)
Care Coordination   Follow Up Visit Note   05/05/2023 Name: Jordan Chan MRN: [id number] DOB: 08/21/1950  Jordan Chan is a 72 y.o. year old female who sees Aura Dials T, NP for primary care. I spoke with  Jordan Chan by phone today.  What matters to the patients health and wellness today?  Complete recovery from surgery.  Was readmitted for hip infection, state this is now resolved after taking antibiotics.  Not working with PT due to [facility] company not receiving orders yet, state she will discuss with surgeon today.  Feels she has been doing well by herself.  Denies any urgent concerns, encouraged to contact this care manager with questions.      Goals Addressed             This Visit's Progress    Decrease A1C in effort to have hip surgery   On track    Care Coordination Interventions: Provided education to patient about basic DM disease process Reviewed medications with patient and discussed importance of medication adherence Counseled on importance of regular laboratory monitoring as prescribed Discussed plans with patient for ongoing care management follow up and provided patient with direct contact information for care management team Provided patient with written educational materials related to hypo and hyperglycemia and importance of correct treatment      Recover from Pneumonia and manage COPD   On track    Care Coordination Interventions: Provided patient with basic written and verbal COPD education on self care/management/and exacerbation prevention Advised patient to track and manage COPD triggers Provided written and verbal instructions on pursed lip breathing and utilized returned demonstration as teach back Provided instruction about proper use of medications used for management of COPD including inhalers Advised patient to self assesses COPD action plan zone and make appointment with provider if in the yellow zone for 48 hours without  improvement          SDOH assessments and interventions completed:  No     Care Coordination Interventions:  Yes, provided   Interventions Today    Flowsheet Row Most Recent Value  Chronic Disease   Chronic disease during today's visit Other, Chronic Obstructive Pulmonary Disease (COPD)  [hip replacement and post surgical infection]  General Interventions   General Interventions Discussed/Reviewed General Interventions Reviewed, Durable Medical Equipment (DME), Community Resources, Doctor Visits  [has not had PT since most recent disccharge home, waiting for resumption of orders]  Doctor Visits Discussed/Reviewed Doctor Visits Reviewed, Specialist  [ortho today, pain clinic on 10/23]  Durable Medical Equipment (DME) Walker  PCP/Specialist Visits Compliance with follow-up visit  Exercise Interventions   Exercise Discussed/Reviewed Physical Activity, Exercise Reviewed  Physical Activity Discussed/Reviewed Physical Activity Reviewed  [independently doing PT exercises that were provided post surgery]  Education Interventions   Education Provided Provided Education  Provided Verbal Education On Medication, When to see the doctor, Community Resources  [Offered to call [facility] company for follow up, she declined]       Follow up plan: Follow up call scheduled for 10/29    Encounter Outcome:  Patient Visit Completed   Kemper Durie, RN, MSN, [facility] Care Management Care Management Coordinator [phone]

## 2023-05-12 ENCOUNTER — Ambulatory Visit (INDEPENDENT_AMBULATORY_CARE_PROVIDER_SITE_OTHER): Payer: Medicare HMO

## 2023-05-12 DIAGNOSIS — E538 Deficiency of other specified B group vitamins: Secondary | ICD-10-CM

## 2023-05-25 ENCOUNTER — Other Ambulatory Visit: Payer: Self-pay | Admitting: Nurse Practitioner

## 2023-05-26 NOTE — Telephone Encounter (Signed)
Requested Prescriptions  Pending Prescriptions Disp Refills   pantoprazole (PROTONIX) 40 MG tablet [Pharmacy Med Name: Pantoprazole Sodium 40 MG Oral Tablet Delayed Release] 90 tablet 0    Sig: Take 1 tablet by mouth once daily     Gastroenterology: Proton Pump Inhibitors Passed - 05/25/2023  6:40 AM      Passed - Valid encounter within last 12 months    Recent Outpatient Visits           2 months ago Insulin dependent type 2 diabetes mellitus (HCC)   [facility] [facility] [place], [place] T, NP   3 months ago Insulin dependent type 2 diabetes mellitus (HCC)   [place] [facility] [place], [place] T, NP   4 months ago Insulin dependent type 2 diabetes mellitus (HCC)   [place] [facility] [place], [place] T, NP   5 months ago Insulin dependent type 2 diabetes mellitus (HCC)   [facility] [place], Corrie Dandy T, NP   5 months ago Severe sepsis [facility])   [place] [facility] [place], Dorie Rank, NP       Future Appointments             In 2 weeks [name], Dorie Rank, NP [place] [facility], [facility]   In 1 month End, Cristal Deer, MD [facility] at [facility]

## 2023-05-27 DIAGNOSIS — G893 Neoplasm related pain (acute) (chronic): Secondary | ICD-10-CM | POA: Diagnosis not present

## 2023-05-27 DIAGNOSIS — M5442 Lumbago with sciatica, left side: Secondary | ICD-10-CM | POA: Diagnosis not present

## 2023-05-27 DIAGNOSIS — M1612 Unilateral primary osteoarthritis, left hip: Secondary | ICD-10-CM | POA: Diagnosis not present

## 2023-05-27 DIAGNOSIS — Z978 Presence of other specified devices: Secondary | ICD-10-CM | POA: Diagnosis not present

## 2023-05-27 DIAGNOSIS — M792 Neuralgia and neuritis, unspecified: Secondary | ICD-10-CM | POA: Diagnosis not present

## 2023-06-01 ENCOUNTER — Other Ambulatory Visit: Payer: Self-pay | Admitting: Nurse Practitioner

## 2023-06-01 DIAGNOSIS — Z96642 Presence of left artificial hip joint: Secondary | ICD-10-CM | POA: Diagnosis not present

## 2023-06-02 ENCOUNTER — Ambulatory Visit: Payer: Self-pay | Admitting: *Deleted

## 2023-06-02 NOTE — Patient Outreach (Signed)
Care Coordination   Follow Up Visit Note   06/02/2023 Name: Jordan Chan MRN: [id number] DOB: 1950/11/08  Jordan Chan is a 72 y.o. year old female who sees Aura Dials T, NP for primary care. I spoke with  Jordan Chan by phone today.  What matters to the patients health and wellness today?  Continue getting stronger post hip surgery. State she has been cleared by ortho.  Denies any urgent concerns, encouraged to contact this care manager with questions.      Goals Addressed             This Visit's Progress    COMPLETED: Decrease A1C in effort to have hip surgery   On track    Care Coordination Interventions: Provided education to patient about basic DM disease process Reviewed medications with patient and discussed importance of medication adherence Counseled on importance of regular laboratory monitoring as prescribed Discussed plans with patient for ongoing care management follow up and provided patient with direct contact information for care management team Provided patient with written educational materials related to hypo and hyperglycemia and importance of correct treatment      COMPLETED: Recover from Pneumonia and manage COPD   On track    Care Coordination Interventions: Provided patient with basic written and verbal COPD education on self care/management/and exacerbation prevention Advised patient to track and manage COPD triggers Provided written and verbal instructions on pursed lip breathing and utilized returned demonstration as teach back Provided instruction about proper use of medications used for management of COPD including inhalers Advised patient to self assesses COPD action plan zone and make appointment with provider if in the yellow zone for 48 hours without improvement          SDOH assessments and interventions completed:  No     Care Coordination Interventions:  Yes, provided   Interventions Today    Flowsheet Row  Most Recent Value  Chronic Disease   Chronic disease during today's visit Chronic Obstructive Pulmonary Disease (COPD), Diabetes, Other  [Hip surgery]  General Interventions   General Interventions Discussed/Reviewed Labs, General Interventions Reviewed, Health Screening, Annual Eye Exam, Doctor Visits, Vaccines  [aware of need for eye exam]  Labs Hgb A1c every 3 months  Vaccines COVID-19, Flu, Pneumonia  [Will get flu shot during next visit, will inquire about timing for next pneumonia.  Does not want another covid booster]  Doctor Visits Discussed/Reviewed Doctor Visits Reviewed, PCP, Specialist  [upcoming with pulmonary 11/5, PCP 11/8, pain clinic 11/13, andcardiology 11/21]  Health Screening Mammogram  [mammogram scheduled for tomorrow]  PCP/Specialist Visits Compliance with follow-up visit  Exercise Interventions   Exercise Discussed/Reviewed Physical Activity  Physical Activity Discussed/Reviewed Physical Activity Reviewed  [continues to do exercises post hip surgery]  Education Interventions   Education Provided Provided Education  Provided Verbal Education On Eye Care, Nutrition, Blood Sugar Monitoring, Labs, When to see the doctor, Medication  [monitoring blood sugar daily]  Labs Reviewed Hgb A1c  Nutrition Interventions   Nutrition Discussed/Reviewed Nutrition Reviewed, Carbohydrate meal planning, Decreasing sugar intake, Adding fruits and vegetables, Fluid intake  [Reminded to watch sugars, carbs, and increase water intake to manage blood sugars]       Follow up plan: Follow up call scheduled for 11/26    Encounter Outcome:  Patient Visit Completed   Kemper Durie RN, MSN, CCM [place]  [facility], [facility] Health RN Care Coordinator Direct Dial: [phone] / Main [phone] Fax [phone] Email: [email or web address]: [email or web address]

## 2023-06-02 NOTE — Telephone Encounter (Signed)
Requested Prescriptions  Pending Prescriptions Disp Refills   busPIRone (BUSPAR) 5 MG tablet [Pharmacy Med Name: busPIRone HCl 5 MG Oral Tablet] 60 tablet 11    Sig: Take 1 tablet by mouth twice daily     Psychiatry: Anxiolytics/Hypnotics - Non-controlled Passed - 06/01/2023  6:40 AM      Passed - Valid encounter within last 12 months    Recent Outpatient Visits           2 months ago Insulin dependent type 2 diabetes mellitus (HCC)   [place] [facility] [place], [place] T, NP   3 months ago Insulin dependent type 2 diabetes mellitus (HCC)   [facility] [place], [place] T, NP   4 months ago Insulin dependent type 2 diabetes mellitus (HCC)   [facility] [place], [place] T, NP   5 months ago Insulin dependent type 2 diabetes mellitus (HCC)   [facility] [place], Corrie Dandy T, NP   5 months ago Severe sepsis [facility])   [place] [facility] [place], Dorie Rank, NP       Future Appointments             In 1 week [name], Dorie Rank, NP [facility], [facility]   In 3 weeks End, Cristal Deer, MD [facility] at [facility]

## 2023-06-03 ENCOUNTER — Encounter: Payer: Self-pay | Admitting: Nurse Practitioner

## 2023-06-03 ENCOUNTER — Ambulatory Visit
Admission: RE | Admit: 2023-06-03 | Discharge: 2023-06-03 | Disposition: A | Discharge status: Home / Self Care | Payer: Medicare HMO | Source: Ambulatory Visit | Attending: Nurse Practitioner | Admitting: Nurse Practitioner

## 2023-06-03 DIAGNOSIS — Z780 Asymptomatic menopausal state: Secondary | ICD-10-CM | POA: Diagnosis present

## 2023-06-03 DIAGNOSIS — Z1231 Encounter for screening mammogram for malignant neoplasm of breast: Secondary | ICD-10-CM

## 2023-06-03 DIAGNOSIS — Z9221 Personal history of antineoplastic chemotherapy: Secondary | ICD-10-CM | POA: Insufficient documentation

## 2023-06-03 DIAGNOSIS — M85832 Other specified disorders of bone density and structure, left forearm: Secondary | ICD-10-CM | POA: Insufficient documentation

## 2023-06-03 DIAGNOSIS — Z923 Personal history of irradiation: Secondary | ICD-10-CM | POA: Insufficient documentation

## 2023-06-03 NOTE — Progress Notes (Signed)
Contacted via MyChart   Your bone density shows thinning bones (osteopenia) but not brittle (osteoporosis). We recommend Vitamin D supplementation of about 2,0000 IUs of over the counter Vitamin D3. In addition, we recommend a diet high in calcium with dairy and dark green leafy vegetables. We would like you to get plenty of weight bearing exercises with walking and resistance training such as light weights or resistance bands available with instructions at places such as [employer].  Any questions?

## 2023-06-05 NOTE — Progress Notes (Signed)
Contacted via MyChart   Normal mammogram, may repeat in one year:)

## 2023-06-07 ENCOUNTER — Other Ambulatory Visit: Payer: Self-pay | Admitting: Nurse Practitioner

## 2023-06-07 NOTE — Patient Instructions (Signed)
Be Involved in Caring For Your Health:  Taking Medications When medications are taken as directed, they can greatly improve your health. But if they are not taken as prescribed, they may not work. In some cases, not taking them correctly can be harmful. To help ensure your treatment remains effective and safe, understand your medications and how to take them. Bring your medications to each visit for review by your provider.  Your lab results, notes, and after visit summary will be available on My Chart. We strongly encourage you to use this feature. If lab results are abnormal the clinic will contact you with the appropriate steps. If the clinic does not contact you assume the results are satisfactory. You can always view your results on My Chart. If you have questions regarding your health or results, please contact the clinic during office hours. You can also ask questions on My Chart.  We at [facility] are grateful that you chose [place] to provide your care. We strive to provide evidence-based and compassionate care and are always looking for feedback. If you get a survey from the clinic please complete this so we can hear your opinions.  Diabetes Mellitus and Foot Care Diabetes, also called diabetes mellitus, may cause problems with your feet and legs because of poor blood flow (circulation). Poor circulation may make your skin: Become thinner and drier. Break more easily. Heal more slowly. Peel and crack. You may also have nerve damage (neuropathy). This can cause decreased feeling in your legs and feet. This means that you may not notice minor injuries to your feet that could lead to more serious problems. Finding and treating problems early is the best way to prevent future foot problems. How to care for your feet Foot hygiene  Wash your feet daily with warm water and mild soap. Do not use hot water. Then, pat your feet and the areas between your toes until they are fully dry. Do  not soak your feet. This can dry your skin. Trim your toenails straight across. Do not dig under them or around the cuticle. File the edges of your nails with an emery board or nail file. Apply a moisturizing lotion or petroleum jelly to the skin on your feet and to dry, brittle toenails. Use lotion that does not contain alcohol and is unscented. Do not apply lotion between your toes. Shoes and socks Wear clean socks or stockings every day. Make sure they are not too tight. Do not wear knee-high stockings. These may decrease blood flow to your legs. Wear shoes that fit well and have enough cushioning. Always look in your shoes before you put them on to be sure there are no objects inside. To break in new shoes, wear them for just a few hours a day. This prevents injuries on your feet. Wounds, scrapes, corns, and calluses  Check your feet daily for blisters, cuts, bruises, sores, and redness. If you cannot see the bottom of your feet, use a mirror or ask someone for help. Do not cut off corns or calluses or try to remove them with medicine. If you find a minor scrape, cut, or break in the skin on your feet, keep it and the skin around it clean and dry. You may clean these areas with mild soap and water. Do not clean the area with peroxide, alcohol, or iodine. If you have a wound, scrape, corn, or callus on your foot, look at it several times a day to make sure it  is healing and not infected. Check for: Redness, swelling, or pain. Fluid or blood. Warmth. Pus or a bad smell. General tips Do not cross your legs. This may decrease blood flow to your feet. Do not use heating pads or hot water bottles on your feet. They may burn your skin. If you have lost feeling in your feet or legs, you may not know this is happening until it is too late. Protect your feet from hot and cold by wearing shoes, such as at the beach or on hot pavement. Schedule a complete foot exam at least once a year or more often if  you have foot problems. Report any cuts, sores, or bruises to your health care provider right away. Where to find more information American Diabetes Association: diabetes.org Association of Diabetes Care & Education Specialists: diabeteseducator.org Contact a health care provider if: You have a condition that increases your risk of infection, and you have any cuts, sores, or bruises on your feet. You have an injury that is not healing. You have redness on your legs or feet. You feel burning or tingling in your legs or feet. You have pain or cramps in your legs and feet. Your legs or feet are numb. Your feet always feel cold. You have pain around any toenails. Get help right away if: You have a wound, scrape, corn, or callus on your foot and: You have signs of infection. You have a fever. You have a red line going up your leg. This information is not intended to replace advice given to you by your health care provider. Make sure you discuss any questions you have with your health care provider. Document Revised: 01/22/2022 Document Reviewed: 01/22/2022 Elsevier Patient Education  2024 [employer].

## 2023-06-08 NOTE — Telephone Encounter (Signed)
Requested Prescriptions  Pending Prescriptions Disp Refills   atorvastatin (LIPITOR) 40 MG tablet [Pharmacy Med Name: Atorvastatin Calcium 40 MG Oral Tablet] 90 tablet 0    Sig: Take 1 tablet by mouth once daily     Cardiovascular:  Antilipid - Statins Failed - 06/07/2023  7:40 AM      Failed - Lipid Panel in normal range within the last 12 months    Cholesterol, Total  Date Value Ref Range Status  12/11/2022 169 100 - 199 mg/dL Final   Cholesterol Piccolo, Waived  Date Value Ref Range Status  07/26/2019 127 <200 mg/dL Final    Comment:                            Desirable                <200                         Borderline High      200- 239                         High                     >239    LDL Chol Calc (NIH)  Date Value Ref Range Status  12/11/2022 75 0 - 99 mg/dL Final   HDL  Date Value Ref Range Status  12/11/2022 68 >39 mg/dL Final   Triglycerides  Date Value Ref Range Status  12/11/2022 151 (H) 0 - 149 mg/dL Final   Triglycerides Piccolo,Waived  Date Value Ref Range Status  07/26/2019 104 <150 mg/dL Final    Comment:                            Normal                   <150                         Borderline High     150 - 199                         High                200 - 499                         Very High                >499          Passed - Patient is not pregnant      Passed - Valid encounter within last 12 months    Recent Outpatient Visits           2 months ago Insulin dependent type 2 diabetes mellitus (HCC)   [facility] [place], [place] T, NP   3 months ago Insulin dependent type 2 diabetes mellitus (HCC)   [place] [facility] [place], [place] T, NP   4 months ago Insulin dependent type 2 diabetes mellitus (HCC)   [place] [facility] [place], [place] T, NP   5 months ago Insulin dependent type 2 diabetes mellitus (HCC)   [place] [facility] [name], Dorie Rank, NP   5 months ago Severe sepsis [facility])   [place] [facility] [place], Dorie Rank, NP       Future Appointments             In 4 days [name], Dorie Rank, NP [place] [facility], [facility]   In 2 weeks End, Cristal Deer, MD [facility]

## 2023-06-09 DIAGNOSIS — G4734 Idiopathic sleep related nonobstructive alveolar hypoventilation: Secondary | ICD-10-CM | POA: Diagnosis not present

## 2023-06-09 DIAGNOSIS — R0609 Other forms of dyspnea: Secondary | ICD-10-CM | POA: Diagnosis not present

## 2023-06-09 DIAGNOSIS — J449 Chronic obstructive pulmonary disease, unspecified: Secondary | ICD-10-CM | POA: Diagnosis not present

## 2023-06-12 ENCOUNTER — Ambulatory Visit (INDEPENDENT_AMBULATORY_CARE_PROVIDER_SITE_OTHER): Payer: Medicare HMO | Admitting: Nurse Practitioner

## 2023-06-12 ENCOUNTER — Encounter: Payer: Self-pay | Admitting: Nurse Practitioner

## 2023-06-12 VITALS — BP 104/63 | HR 96 | Temp 98.2°F | Wt 140.6 lb

## 2023-06-12 DIAGNOSIS — J449 Chronic obstructive pulmonary disease, unspecified: Secondary | ICD-10-CM

## 2023-06-12 DIAGNOSIS — E785 Hyperlipidemia, unspecified: Secondary | ICD-10-CM | POA: Diagnosis not present

## 2023-06-12 DIAGNOSIS — F324 Major depressive disorder, single episode, in partial remission: Secondary | ICD-10-CM

## 2023-06-12 DIAGNOSIS — E119 Type 2 diabetes mellitus without complications: Secondary | ICD-10-CM | POA: Diagnosis not present

## 2023-06-12 DIAGNOSIS — E1159 Type 2 diabetes mellitus with other circulatory complications: Secondary | ICD-10-CM | POA: Diagnosis not present

## 2023-06-12 DIAGNOSIS — E538 Deficiency of other specified B group vitamins: Secondary | ICD-10-CM | POA: Diagnosis not present

## 2023-06-12 DIAGNOSIS — Z23 Encounter for immunization: Secondary | ICD-10-CM | POA: Diagnosis not present

## 2023-06-12 DIAGNOSIS — N1831 Chronic kidney disease, stage 3a: Secondary | ICD-10-CM

## 2023-06-12 DIAGNOSIS — I4819 Other persistent atrial fibrillation: Secondary | ICD-10-CM | POA: Diagnosis not present

## 2023-06-12 DIAGNOSIS — I5022 Chronic systolic (congestive) heart failure: Secondary | ICD-10-CM

## 2023-06-12 DIAGNOSIS — Z794 Long term (current) use of insulin: Secondary | ICD-10-CM | POA: Diagnosis not present

## 2023-06-12 DIAGNOSIS — E1169 Type 2 diabetes mellitus with other specified complication: Secondary | ICD-10-CM

## 2023-06-12 DIAGNOSIS — I152 Hypertension secondary to endocrine disorders: Secondary | ICD-10-CM

## 2023-06-12 DIAGNOSIS — G4733 Obstructive sleep apnea (adult) (pediatric): Secondary | ICD-10-CM

## 2023-06-12 DIAGNOSIS — G894 Chronic pain syndrome: Secondary | ICD-10-CM

## 2023-06-12 LAB — BAYER DCA HB A1C WAIVED: HB A1C (BAYER DCA - WAIVED): 7.4 % — ABNORMAL HIGH (ref 4.8–5.6)

## 2023-06-12 MED ORDER — LOSARTAN POTASSIUM 25 MG PO TABS: 12.5000 mg | ORAL_TABLET | Freq: Every day | ORAL | 4 refills | Status: DC

## 2023-06-12 MED ORDER — METFORMIN HCL 1000 MG PO TABS: 1000.0000 mg | ORAL_TABLET | Freq: Two times a day (BID) | ORAL | 4 refills | Status: DC

## 2023-06-12 MED ORDER — METOPROLOL SUCCINATE ER 25 MG PO TB24: 12.5000 mg | ORAL_TABLET | Freq: Every day | ORAL | 4 refills | Status: DC

## 2023-06-12 MED ORDER — MONTELUKAST SODIUM 10 MG PO TABS: 10.0000 mg | ORAL_TABLET | Freq: Every day | ORAL | 4 refills | Status: DC

## 2023-06-12 MED ORDER — GABAPENTIN 800 MG PO TABS: 800.0000 mg | ORAL_TABLET | Freq: Three times a day (TID) | ORAL | 4 refills | Status: DC

## 2023-06-12 MED ORDER — VENLAFAXINE HCL ER 150 MG PO CP24: 150.0000 mg | ORAL_CAPSULE | Freq: Every day | ORAL | 4 refills | Status: DC

## 2023-06-12 NOTE — Assessment & Plan Note (Signed)
Chronic, ongoing with most recent EF 45-50% (10/22/22), followed by cardiology.  Euvolemic today.  Continue current medication regimen and collaboration with cardiology.  Recommend: - Reminded to call for an overnight weight gain of >2 pounds or a weekly weight gain of >5 pounds - not adding salt to food and read food labels. Reviewed the importance of keeping daily sodium intake to 2000mg  daily - Avoid Ibuprofen products

## 2023-06-12 NOTE — Assessment & Plan Note (Signed)
Chronic, stable.  Continue current medication regimen and adjust as needed.  Lipid panel today. 

## 2023-06-12 NOTE — Assessment & Plan Note (Addendum)
Ongoing and stable recent labs.  Continue Losartan at low dose for heart and kidney protection.  Labs CBC and CMP.  Will refer to nephrology if any worsening in future.

## 2023-06-12 NOTE — Assessment & Plan Note (Signed)
Chronic.  Poor tolerance of CPAP mask, uses O2 night 2 L [place]. 

## 2023-06-12 NOTE — Assessment & Plan Note (Signed)
Chronic, stable.  BP well below goal in office today.  Recommend she continue to monitor BP at home regularly.  Focus on DASH diet.  Continue current medication regimen and adjust as needed, refills sent as needed.  LABS: CMP.  Return in 3 months.

## 2023-06-12 NOTE — Progress Notes (Signed)
BP 104/63   Pulse 96   Temp 98.2 F (36.8 C) (Oral)   Wt 140 lb 9.6 oz (63.8 kg)   LMP  (LMP Unknown)   SpO2 98%   BMI 28.89 kg/m    Subjective:    [name], female    DOB: 09-17-1950, 72 y.o.   MRN: [id number]  HPI: Jordan Chan is a 72 y.o. female  Chief Complaint  Patient presents with   COPD   Depression   Diabetes   Hyperlipidemia   Hypertension   DIABETES A1c 6.7% September. Taking Metformin 1000 MG BID, Trulicity 4.5 MG weekly, Tresiba 50 units at HS, and meal time insulin which we started 12/11/22 to assist with A1c for surgery. Jardiance made her sick in past and Mounjaro gave her severe diarrhea and abdominal pain.  Works with [employer] team and provider.  Recently she has been having more nausea and hoarseness, does take Zofran and Protoni for GERD.   Continues monthly B12 injections for low levels.  Did visit with hematology in past, 02/11/22, received 5 iron infusions total.  Taking folic acid.  WBC remains stable.  If becomes elevated (>20) and continues to elevate they will perform bone marrow biopsy.  Hypoglycemic episodes:yes x 1 that was 69 Polydipsia/polyuria: no Visual disturbance: no Chest pain: no Paresthesias: no Glucose Monitoring: yes  Accucheck frequency: BID  Fasting glucose: this morning 170, often is 130 or less on checks  Post prandial: 180 range -- then trends down to 80's  Evening:  Before meals: Taking Insulin?: yes  Long acting insulin: 50 units  Short acting insulin: 3 units Blood Pressure Monitoring: monthly Retinal Examination: Not up to Date -- March 2025 scheduled Foot Exam: Up to Date Pneumovax: Up to Date Influenza: Up to Date Aspirin: no   HYPERTENSION / HYPERLIPIDEMIA Last cardiology visit on 10/22/22 - returns next week. Taking Atorvastatin 40 MG, Metoprolol 12.5 MG daily, Lasix 40 MG daily, Losartan 12.5 MG daily, + ASA.  Continues on Eliquis.    Echo 10/22/22 showed EF 45-50% with mildly decreased LV  function.  Satisfied with current treatment? yes Duration of hypertension: chronic BP monitoring frequency: a few times a week BP range: 110/60 range BP medication side effects: no:  Duration of hyperlipidemia: chronic Cholesterol medication side effects: no Cholesterol supplements: none Medication compliance: good compliance Aspirin: no Recent stressors: no Recurrent headaches: no Visual changes: no Palpitations: no Dyspnea: no Chest pain: no Lower extremity edema: no Dizzy/lightheaded: no   COPD Saw Dr. Meredeth Ide for COPD -- last saw 06/09/23.  Continues Trelegy, Albuterol, and Singulair.     Has a history of smoking, quit 15 years ago. She does not use CPAP, but continues to use O2 at night 2 L == sleeps in recliner at baseline, has since 1998. COPD status: stable Satisfied with current treatment?: yes Oxygen use: yes 2L Dyspnea frequency: no Cough frequency: no Rescue inhaler frequency:  once in a blue moon Limitation of activity: no Productive cough: no Last Spirometry: with pulmonary Pneumovax: Up to Date Influenza: Up to Date   ATRIAL FIBRILLATION Atrial fibrillation status: stable Satisfied with current treatment: yes  Medication side effects:  no Medication compliance: good compliance Etiology of atrial fibrillation: unknown Palpitations:  no Chest pain:  no Dyspnea on exertion:  no Orthopnea:  no Syncope:  no Edema:  no Ventricular rate control: B-blocker Anti-coagulation: long acting   CHRONIC KIDNEY DISEASE Improved on recent check. CKD status: stable Medications renally dose:  yes Previous renal evaluation: no Pneumovax:  Up to Date Influenza Vaccine:  Up to Date   DEPRESSION Continues on Effexor 150 MG daily + Buspar 5 MG BID.  Is followed by pain clinic in [facility], has morphine pump.  However, insurance will no longer cover and she reports she will have to return locally. Mood status: stable Satisfied with current treatment?:  yes Symptom severity: moderate  Duration of current treatment : chronic Side effects: no Medication compliance: good compliance Psychotherapy/counseling: yes in the past Depressed mood: occasional Anxious mood: no Anhedonia: no Significant weight loss or gain: no Insomnia: no Fatigue: no Feelings of worthlessness or guilt: no Impaired concentration/indecisiveness: no Suicidal ideations: no Hopelessness: no Crying spells: yes    06/12/2023    9:23 AM 03/11/2023    9:25 AM 01/14/2023    1:46 PM 12/23/2022    9:52 AM 12/23/2022    9:25 AM  Depression screen PHQ 2/9  Decreased Interest 0  2 1 0  Down, Depressed, Hopeless 0  2 1 2   PHQ - 2 Score 0  4 2 2   Altered sleeping 2  2 0 3  Tired, decreased energy 1  3 1  0  Change in appetite 2  1 0 0  Feeling bad or failure about yourself  0  0 0 1  Trouble concentrating 0  1 0   Moving slowly or fidgety/restless 0  0 0 0  Suicidal thoughts 0  0 0 0  PHQ-9 Score 5  11 3 6   Difficult doing work/chores Not difficult at all Not difficult at all Somewhat difficult Not difficult at all Not difficult at all       06/12/2023    9:23 AM 01/14/2023    1:46 PM 12/23/2022    9:25 AM 12/11/2022   11:41 AM  GAD 7 : Generalized Anxiety Score  Nervous, Anxious, on Edge 0 3 2 1   Control/stop worrying 0 2 1 1   Worry too much - different things 0 1 1 1   Trouble relaxing 0 2 1 1   Restless 0 2 0 0  Easily annoyed or irritable 0 1 1 1   Afraid - awful might happen 0 0 0 0  Total GAD 7 Score 0 11 6 5   Anxiety Difficulty Not difficult at all Somewhat difficult Not difficult at all Not difficult at all   Relevant past medical, surgical, family and social history reviewed and updated as indicated. Interim medical history since our last visit reviewed. Allergies and medications reviewed and updated.  Review of Systems  Constitutional:  Negative for activity change, appetite change, diaphoresis, fatigue and fever.  Respiratory:  Negative for cough, chest  tightness and shortness of breath.   Cardiovascular:  Negative for chest pain, palpitations and leg swelling.  Gastrointestinal: Negative.   Endocrine: Negative for heat intolerance, polydipsia, polyphagia and polyuria.  Neurological: Negative.   Psychiatric/Behavioral: Negative.     Per HPI unless specifically indicated above     Objective:    BP 104/63   Pulse 96   Temp 98.2 F (36.8 C) (Oral)   Wt 140 lb 9.6 oz (63.8 kg)   LMP  (LMP Unknown)   SpO2 98%   BMI 28.89 kg/m   Wt Readings from Last 3 Encounters:  06/12/23 140 lb 9.6 oz (63.8 kg)  04/15/23 143 lb 1.3 oz (64.9 kg)  03/14/23 143 lb (64.9 kg)    Physical Exam Vitals and nursing note reviewed.  Constitutional:  General: She is awake. She is not in acute distress.    Appearance: Normal appearance. She is well-developed and well-groomed. She is not ill-appearing or toxic-appearing.  HENT:     Head: Normocephalic.     Right Ear: Hearing and external ear normal.     Left Ear: Hearing and external ear normal.  Eyes:     General: Lids are normal.        Right eye: No discharge.        Left eye: No discharge.     Conjunctiva/sclera: Conjunctivae normal.     Pupils: Pupils are equal, round, and reactive to light.  Neck:     Thyroid: No thyromegaly.     Vascular: No carotid bruit.     Comments: Kyphosis present back. Cardiovascular:     Rate and Rhythm: Normal rate and regular rhythm.     Heart sounds: Normal heart sounds. No murmur heard.    No gallop.  Pulmonary:     Effort: Pulmonary effort is normal. No accessory muscle usage or respiratory distress.     Breath sounds: Normal breath sounds.  Abdominal:     General: Bowel sounds are normal. There is no distension.     Palpations: Abdomen is soft.     Tenderness: There is no abdominal tenderness.  Musculoskeletal:     Cervical back: Normal range of motion and neck supple. Torticollis present.     Right lower leg: No edema.     Left lower leg: No edema.   Lymphadenopathy:     Cervical: No cervical adenopathy.  Skin:    General: Skin is warm and dry.  Neurological:     Mental Status: She is alert and oriented to person, place, and time.     Deep Tendon Reflexes: Reflexes are normal and symmetric.     Reflex Scores:      Brachioradialis reflexes are 2+ on the right side and 2+ on the left side.      Patellar reflexes are 2+ on the right side and 2+ on the left side. Psychiatric:        Attention and Perception: Attention normal.        Mood and Affect: Mood normal.        Speech: Speech normal.        Behavior: Behavior normal. Behavior is cooperative.        Thought Content: Thought content normal.    Results for orders placed or performed during the hospital encounter of 04/15/23  SARS Coronavirus 2 by RT PCR (hospital order, performed in [facility] hospital lab) *cepheid single result test* Anterior Nasal Swab   Specimen: Anterior Nasal Swab  Result Value Ref Range   SARS Coronavirus 2 by RT PCR NEGATIVE NEGATIVE  Blood culture (routine x 2)   Specimen: BLOOD  Result Value Ref Range   Specimen Description BLOOD  LEFT FOREARM    Special Requests      BOTTLES DRAWN AEROBIC AND ANAEROBIC Blood Culture results may not be optimal due to an inadequate volume of blood received in culture bottles   Culture      NO GROWTH 5 DAYS Performed at [facility], [address] [place], [place] [address]    Report Status 04/21/2023 FINAL   Blood culture (routine x 2)   Specimen: BLOOD  Result Value Ref Range   Specimen Description BLOOD  RFA    Special Requests      BOTTLES DRAWN AEROBIC AND ANAEROBIC Blood Culture  results may not be optimal due to an inadequate volume of blood received in culture bottles   Culture      NO GROWTH 5 DAYS Performed at [facility], [address] [place], [place] [address]    Report Status 04/21/2023 FINAL   Lactic acid, plasma  Result Value Ref Range   Lactic Acid, Venous 1.5 0.5  - 1.9 mmol/L  Comprehensive metabolic panel  Result Value Ref Range   Sodium 136 135 - 145 mmol/L   Potassium 3.9 3.5 - 5.1 mmol/L   Chloride 99 98 - 111 mmol/L   CO2 24 22 - 32 mmol/L   Glucose, Bld 147 (H) 70 - 99 mg/dL   BUN 10 8 - 23 mg/dL   Creatinine, Ser 9.56 0.44 - 1.00 mg/dL   Calcium 9.4 8.9 - 21.3 mg/dL   Total Protein 7.9 6.5 - 8.1 g/dL   Albumin 3.6 3.5 - 5.0 g/dL   AST 17 15 - 41 U/L   ALT 11 0 - 44 U/L   Alkaline Phosphatase 68 38 - 126 U/L   Total Bilirubin 0.3 0.3 - 1.2 mg/dL   GFR, Estimated >08 >65 mL/min   Anion gap 13 5 - 15  CBC with Differential  Result Value Ref Range   WBC 17.6 (H) 4.0 - 10.5 K/uL   RBC 3.57 (L) 3.87 - 5.11 MIL/uL   Hemoglobin 10.5 (L) 12.0 - 15.0 g/dL   HCT 78.4 (L) 69.6 - 29.5 %   MCV 92.2 80.0 - 100.0 fL   MCH 29.4 26.0 - 34.0 pg   MCHC 31.9 30.0 - 36.0 g/dL   RDW 28.4 13.2 - 44.0 %   Platelets 785 (H) 150 - 400 K/uL   nRBC 0.0 0.0 - 0.2 %   Neutrophils Relative % 73 %   Neutro Abs 13.0 (H) 1.7 - 7.7 K/uL   Lymphocytes Relative 18 %   Lymphs Abs 3.2 0.7 - 4.0 K/uL   Monocytes Relative 6 %   Monocytes Absolute 1.0 0.1 - 1.0 K/uL   Eosinophils Relative 1 %   Eosinophils Absolute 0.2 0.0 - 0.5 K/uL   Basophils Relative 1 %   Basophils Absolute 0.1 0.0 - 0.1 K/uL   Immature Granulocytes 1 %   Abs Immature Granulocytes 0.12 (H) 0.00 - 0.07 K/uL  Procalcitonin  Result Value Ref Range   Procalcitonin <0.10 ng/mL  Urinalysis, Routine w reflex microscopic -Urine, Clean Catch  Result Value Ref Range   Color, Urine YELLOW (A) YELLOW   APPearance CLEAR (A) CLEAR   Specific Gravity, Urine 1.015 1.005 - 1.030   pH 7.0 5.0 - 8.0   Glucose, UA NEGATIVE NEGATIVE mg/dL   Hgb urine dipstick NEGATIVE NEGATIVE   Bilirubin Urine NEGATIVE NEGATIVE   Ketones, ur NEGATIVE NEGATIVE mg/dL   Protein, ur NEGATIVE NEGATIVE mg/dL   Nitrite NEGATIVE NEGATIVE   Leukocytes,Ua NEGATIVE NEGATIVE  Hemoglobin A1c  Result Value Ref Range   Hgb A1c  MFr Bld 6.7 (H) 4.8 - 5.6 %   Mean Plasma Glucose 146 mg/dL  CBC  Result Value Ref Range   WBC 20.0 (H) 4.0 - 10.5 K/uL   RBC 3.56 (L) 3.87 - 5.11 MIL/uL   Hemoglobin 10.3 (L) 12.0 - 15.0 g/dL   HCT 10.2 (L) 72.5 - 36.6 %   MCV 91.6 80.0 - 100.0 fL   MCH 28.9 26.0 - 34.0 pg   MCHC 31.6 30.0 - 36.0 g/dL   RDW 44.0 34.7 - 42.5 %  Platelets 801 (H) 150 - 400 K/uL   nRBC 0.0 0.0 - 0.2 %  Glucose, capillary  Result Value Ref Range   Glucose-Capillary 136 (H) 70 - 99 mg/dL  Glucose, capillary  Result Value Ref Range   Glucose-Capillary 115 (H) 70 - 99 mg/dL  Glucose, capillary  Result Value Ref Range   Glucose-Capillary 154 (H) 70 - 99 mg/dL  Glucose, capillary  Result Value Ref Range   Glucose-Capillary 189 (H) 70 - 99 mg/dL      Assessment & Plan:   Problem List Items Addressed This Visit       Cardiovascular and Mediastinum   Chronic HFrEF (heart failure with reduced ejection fraction) (HCC)    Chronic, ongoing with most recent EF 45-50% (10/22/22), followed by cardiology.  Euvolemic today.  Continue current medication regimen and collaboration with cardiology.  Recommend: - Reminded to call for an overnight weight gain of >2 pounds or a weekly weight gain of >5 pounds - not adding salt to food and read food labels. Reviewed the importance of keeping daily sodium intake to 2000mg  daily - Avoid Ibuprofen products      Relevant Medications   losartan (COZAAR) 25 MG tablet   metoprolol succinate (TOPROL-XL) 25 MG 24 hr tablet   Hypertension associated with diabetes (HCC)    Chronic, stable.  BP well below goal in office today.  Recommend she continue to monitor BP at home regularly.  Focus on DASH diet.  Continue current medication regimen and adjust as needed, refills sent as needed.  LABS: CMP.  Return in 3 months.       Relevant Medications   losartan (COZAAR) 25 MG tablet   metFORMIN (GLUCOPHAGE) 1000 MG tablet   metoprolol succinate (TOPROL-XL) 25 MG 24 hr tablet    Other Relevant Orders   Bayer DCA Hb A1c Waived   CBC with Differential/Platelet   Persistent atrial fibrillation (HCC)    Chronic, stable.  Rate well controlled.  Continue current medications and collaboration with cardiology.      Relevant Medications   losartan (COZAAR) 25 MG tablet   metoprolol succinate (TOPROL-XL) 25 MG 24 hr tablet     Respiratory   COPD, moderate (HCC)    Chronic, stable. Rare use of Albuterol. Continue Trelegy which offers benefit of medication minimization and has benefited symptoms.  Continue to collaborate with Dr. Meredeth Ide, recent notes reviewed.  No current daytime O2, at night only.        Relevant Medications   montelukast (SINGULAIR) 10 MG tablet   Sleep apnea    Chronic.  Poor tolerance of CPAP mask, uses O2 night 2 L [place].        Endocrine   Hyperlipidemia associated with type 2 diabetes mellitus (HCC)    Chronic, stable.  Continue current medication regimen and adjust as needed.  Lipid panel today.      Relevant Medications   losartan (COZAAR) 25 MG tablet   metFORMIN (GLUCOPHAGE) 1000 MG tablet   metoprolol succinate (TOPROL-XL) 25 MG 24 hr tablet   Other Relevant Orders   Bayer DCA Hb A1c Waived   Comprehensive metabolic panel   Lipid Panel w/o Chol/HDL Ratio   Insulin dependent type 2 diabetes mellitus (HCC) - Primary    Chronic, ongoing, insulin dependent. A1c 7.4% today, previous 6.7% last check.  Discussed at length with her today.   - Will continue Metformin at max dosing, Trulicity 4.5 MG weekly, and Tresiba 50 units, increase 3 units in 3 days  if fasting sugar not less then 130 consistently recommended.  Continue meal time insulin, 2-5 units prior to meals and will increase as needed.  Educated her on this.  Reviewed [facility]CM team pharmacist note for further recommendations.   - Freestyle use continues. - Recommend she continue to monitor BS consistently at home and document + focus heavily on diet changes.  She is aware to notify  provider if fasting BS >130 consistently or <70, as may need to adjust insulin further.   - In past poorly tolerated Jardiance and Mounjaro -- discussed with her that and how SGLT2 would benefit HF -- however concern for side effects.   - Foot exam up to date, needs eye exam. - Vaccinations up to date. - Return in 3 months for A1c check.       Relevant Medications   losartan (COZAAR) 25 MG tablet   metFORMIN (GLUCOPHAGE) 1000 MG tablet   Other Relevant Orders   Bayer DCA Hb A1c Waived   Ambulatory referral to Gastroenterology     Genitourinary   CKD stage 3a, GFR 45-59 ml/min (HCC)    Ongoing and stable recent labs.  Continue Losartan at low dose for heart and kidney protection.  Labs CBC and CMP.  Will refer to nephrology if any worsening in future.        Other   Chronic pain syndrome    Chronic.  Continue to collaborate with chronic pain clinic in [facility]  Has morphine pump. She will need to change pain clinic upcoming due to insurance and reports she is going to return to [facility] group as they initially placed pump.       Relevant Medications   gabapentin (NEURONTIN) 800 MG tablet   venlafaxine XR (EFFEXOR-XR) 150 MG 24 hr capsule   Depression, major, single episode, in partial remission (HCC)    Chronic, ongoing.  Denies SI/HI.  Mood well-controlled at this time and overall improved spirits since hip surgery.  Continue current medication regimen and adjust as needed.        Relevant Medications   venlafaxine XR (EFFEXOR-XR) 150 MG 24 hr capsule   RESOLVED: Flu vaccine need   Relevant Orders   Flu Vaccine Trivalent High Dose (Fluad) (Completed)     Follow up plan: Return in about 3 months (around 09/12/2023) for T2DM, HTN/HLD, COPD, A-FIB, MOOD.

## 2023-06-12 NOTE — Assessment & Plan Note (Signed)
Chronic, ongoing, insulin dependent. A1c 7.4% today, previous 6.7% last check.  Discussed at length with her today.   - Will continue Metformin at max dosing, Trulicity 4.5 MG weekly, and Tresiba 50 units, increase 3 units in 3 days if fasting sugar not less then 130 consistently recommended.  Continue meal time insulin, 2-5 units prior to meals and will increase as needed.  Educated her on this.  Reviewed CCM team pharmacist note for further recommendations.   - Freestyle use continues. - Recommend she continue to monitor BS consistently at home and document + focus heavily on diet changes.  She is aware to notify provider if fasting BS >130 consistently or <70, as may need to adjust insulin further.   - In past poorly tolerated Jardiance and Mounjaro -- discussed with her that and how SGLT2 would benefit HF -- however concern for side effects.   - Foot exam up to date, needs eye exam. - Vaccinations up to date. - Return in 3 months for A1c check.

## 2023-06-12 NOTE — Assessment & Plan Note (Signed)
Chronic, stable. Rare use of Albuterol. Continue Trelegy which offers benefit of medication minimization and has benefited symptoms.  Continue to collaborate with Dr. Meredeth Ide, recent notes reviewed.  No current daytime O2, at night only.

## 2023-06-12 NOTE — Assessment & Plan Note (Signed)
Chronic, ongoing.  Denies SI/HI.  Mood well-controlled at this time and overall improved spirits since hip surgery.  Continue current medication regimen and adjust as needed.

## 2023-06-12 NOTE — Assessment & Plan Note (Signed)
Chronic.  Continue to collaborate with chronic pain clinic in [facility].  Has morphine pump. She will need to change pain clinic upcoming due to insurance and reports she is going to return to [facility] group as they initially placed pump.

## 2023-06-12 NOTE — Assessment & Plan Note (Signed)
Chronic, stable.  Rate well controlled.  Continue current medications and collaboration with cardiology.

## 2023-06-13 ENCOUNTER — Other Ambulatory Visit: Payer: Self-pay | Admitting: Nurse Practitioner

## 2023-06-13 LAB — COMPREHENSIVE METABOLIC PANEL
ALT: 10 [IU]/L (ref 0–32)
AST: 13 [IU]/L (ref 0–40)
Albumin: 3.9 g/dL (ref 3.8–4.8)
Alkaline Phosphatase: 94 [IU]/L (ref 44–121)
BUN/Creatinine Ratio: 17 (ref 12–28)
BUN: 23 mg/dL (ref 8–27)
Bilirubin Total: <0.2 mg/dL (ref 0.0–1.2)
CO2: 27 mmol/L (ref 20–29)
Calcium: 9.4 mg/dL (ref 8.7–10.3)
Chloride: 99 mmol/L (ref 96–106)
Creatinine, Ser: 1.39 mg/dL — ABNORMAL HIGH (ref 0.57–1.00)
Globulin, Total: 3.6 g/dL (ref 1.5–4.5)
Glucose: 155 mg/dL — ABNORMAL HIGH (ref 70–99)
Potassium: 4.6 mmol/L (ref 3.5–5.2)
Sodium: 144 mmol/L (ref 134–144)
Total Protein: 7.5 g/dL (ref 6.0–8.5)
eGFR: 40 mL/min/{1.73_m2} — ABNORMAL LOW (ref >59)

## 2023-06-13 LAB — CBC WITH DIFFERENTIAL/PLATELET
Basophils Absolute: 0.1 10*3/uL (ref 0.0–0.2)
Basos: 1 %
EOS (ABSOLUTE): 0.4 10*3/uL (ref 0.0–0.4)
Eos: 4 %
Hematocrit: 34.3 % (ref 34.0–46.6)
Hemoglobin: 10.8 g/dL — ABNORMAL LOW (ref 11.1–15.9)
Immature Grans (Abs): 0 10*3/uL (ref 0.0–0.1)
Immature Granulocytes: 0 %
Lymphocytes Absolute: 2.9 10*3/uL (ref 0.7–3.1)
Lymphs: 30 %
MCH: 28.4 pg (ref 26.6–33.0)
MCHC: 31.5 g/dL (ref 31.5–35.7)
MCV: 90 fL (ref 79–97)
Monocytes Absolute: 0.9 10*3/uL (ref 0.1–0.9)
Monocytes: 9 %
Neutrophils Absolute: 5.6 10*3/uL (ref 1.4–7.0)
Neutrophils: 56 %
Platelets: 465 10*3/uL — ABNORMAL HIGH (ref 150–450)
RBC: 3.8 x10E6/uL (ref 3.77–5.28)
RDW: 13.6 % (ref 11.7–15.4)
WBC: 10 10*3/uL (ref 3.4–10.8)

## 2023-06-13 LAB — LIPID PANEL W/O CHOL/HDL RATIO
Cholesterol, Total: 130 mg/dL (ref 100–199)
HDL: 56 mg/dL (ref >39)
LDL Chol Calc (NIH): 55 mg/dL (ref 0–99)
Triglycerides: 105 mg/dL (ref 0–149)
VLDL Cholesterol Cal: 19 mg/dL (ref 5–40)

## 2023-06-14 NOTE — Progress Notes (Signed)
Contacted via MyChart   Good afternoon Gurleen, your labs have returned: - Kidney function continues to show stage 3b kidney disease.  We will continue to monitor closely and I want you to ensure you work on good water intake. - CBC is showing improvement in hemoglobin and hematocrit.  Still a little low on hemoglobin, but improving.  Platelets are trending down as well.  We will keep an eye on all this.  Any questions? Keep being amazing!!  Thank you for allowing me to participate in your care.  I appreciate you. Kindest regards, Jakari Jacot

## 2023-06-15 NOTE — Telephone Encounter (Signed)
Requested Prescriptions  Pending Prescriptions Disp Refills   TRESIBA FLEXTOUCH 100 UNIT/ML FlexTouch Pen [Pharmacy Med Name: Evaristo Bury FlexTouch 100 UNIT/ML Subcutaneous Solution Pen-injector] 30 mL 0    Sig: INJECT 55 UNITS SUBCUTANEOUSLY AT BEDTIME     Endocrinology:  Diabetes - Insulins Passed - 06/13/2023  6:40 AM      Passed - HBA1C is between 0 and 7.9 and within 180 days    Hemoglobin A1C  Date Value Ref Range Status  04/28/2016 7.7%  Final   HB A1C (BAYER DCA - WAIVED)  Date Value Ref Range Status  06/12/2023 7.4 (H) 4.8 - 5.6 % Final    Comment:             Prediabetes: 5.7 - 6.4          Diabetes: >6.4          Glycemic control for adults with diabetes: <7.0          Passed - Valid encounter within last 6 months    Recent Outpatient Visits           3 days ago Insulin dependent type 2 diabetes mellitus (HCC)   [place] [facility] [place], [place] T, NP   3 months ago Insulin dependent type 2 diabetes mellitus (HCC)   [place] [facility] [place], [place] T, NP   3 months ago Insulin dependent type 2 diabetes mellitus (HCC)   [place] [facility] [place], [place] T, NP   5 months ago Insulin dependent type 2 diabetes mellitus (HCC)   [place] [facility] [place], [place] T, NP   5 months ago Insulin dependent type 2 diabetes mellitus (HCC)   [facility] Wallace, Dorie Rank, NP       Future Appointments             In 1 week End, Cristal Deer, MD [facility] [place]   In 3 months [name], Dorie Rank, NP  [facility]
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
## 2023-06-17 ENCOUNTER — Encounter: Payer: Self-pay | Admitting: Pain Medicine

## 2023-06-17 ENCOUNTER — Ambulatory Visit (HOSPITAL_BASED_OUTPATIENT_CLINIC_OR_DEPARTMENT_OTHER): Payer: Medicare HMO | Admitting: Pain Medicine

## 2023-06-17 ENCOUNTER — Ambulatory Visit
Admission: RE | Admit: 2023-06-17 | Discharge: 2023-06-17 | Disposition: A | Discharge status: Home / Self Care | Payer: Medicare HMO | Source: Ambulatory Visit | Attending: Pain Medicine | Admitting: Pain Medicine

## 2023-06-17 VITALS — BP 115/65 | HR 98 | Temp 97.4°F | Ht <= 58 in | Wt 140.0 lb

## 2023-06-17 DIAGNOSIS — Z978 Presence of other specified devices: Secondary | ICD-10-CM | POA: Diagnosis not present

## 2023-06-17 DIAGNOSIS — G8929 Other chronic pain: Secondary | ICD-10-CM

## 2023-06-17 DIAGNOSIS — M899 Disorder of bone, unspecified: Secondary | ICD-10-CM | POA: Diagnosis present

## 2023-06-17 DIAGNOSIS — R937 Abnormal findings on diagnostic imaging of other parts of musculoskeletal system: Secondary | ICD-10-CM | POA: Insufficient documentation

## 2023-06-17 DIAGNOSIS — M436 Torticollis: Secondary | ICD-10-CM | POA: Insufficient documentation

## 2023-06-17 DIAGNOSIS — Z96642 Presence of left artificial hip joint: Secondary | ICD-10-CM

## 2023-06-17 DIAGNOSIS — M79605 Pain in left leg: Secondary | ICD-10-CM

## 2023-06-17 DIAGNOSIS — M47815 Spondylosis without myelopathy or radiculopathy, thoracolumbar region: Secondary | ICD-10-CM | POA: Diagnosis not present

## 2023-06-17 DIAGNOSIS — G894 Chronic pain syndrome: Secondary | ICD-10-CM | POA: Diagnosis present

## 2023-06-17 DIAGNOSIS — M25552 Pain in left hip: Secondary | ICD-10-CM | POA: Diagnosis present

## 2023-06-17 DIAGNOSIS — Z85831 Personal history of malignant neoplasm of soft tissue: Secondary | ICD-10-CM

## 2023-06-17 DIAGNOSIS — Z853 Personal history of malignant neoplasm of breast: Secondary | ICD-10-CM | POA: Insufficient documentation

## 2023-06-17 DIAGNOSIS — Z789 Other specified health status: Secondary | ICD-10-CM | POA: Insufficient documentation

## 2023-06-17 DIAGNOSIS — M542 Cervicalgia: Secondary | ICD-10-CM | POA: Diagnosis present

## 2023-06-17 DIAGNOSIS — Z471 Aftercare following joint replacement surgery: Secondary | ICD-10-CM | POA: Diagnosis not present

## 2023-06-17 DIAGNOSIS — Z79899 Other long term (current) drug therapy: Secondary | ICD-10-CM | POA: Insufficient documentation

## 2023-06-17 DIAGNOSIS — C50919 Malignant neoplasm of unspecified site of unspecified female breast: Secondary | ICD-10-CM | POA: Diagnosis not present

## 2023-06-17 DIAGNOSIS — M48061 Spinal stenosis, lumbar region without neurogenic claudication: Secondary | ICD-10-CM | POA: Diagnosis not present

## 2023-06-17 DIAGNOSIS — M79604 Pain in right leg: Secondary | ICD-10-CM

## 2023-06-17 DIAGNOSIS — M47814 Spondylosis without myelopathy or radiculopathy, thoracic region: Secondary | ICD-10-CM | POA: Diagnosis not present

## 2023-06-17 DIAGNOSIS — M545 Low back pain, unspecified: Secondary | ICD-10-CM | POA: Insufficient documentation

## 2023-06-17 DIAGNOSIS — M549 Dorsalgia, unspecified: Secondary | ICD-10-CM | POA: Diagnosis not present

## 2023-06-17 DIAGNOSIS — M5030 Other cervical disc degeneration, unspecified cervical region: Secondary | ICD-10-CM | POA: Diagnosis not present

## 2023-06-17 DIAGNOSIS — M8580 Other specified disorders of bone density and structure, unspecified site: Secondary | ICD-10-CM | POA: Diagnosis not present

## 2023-06-17 DIAGNOSIS — M4802 Spinal stenosis, cervical region: Secondary | ICD-10-CM | POA: Diagnosis not present

## 2023-06-17 DIAGNOSIS — M5126 Other intervertebral disc displacement, lumbar region: Secondary | ICD-10-CM | POA: Diagnosis not present

## 2023-06-17 DIAGNOSIS — M47816 Spondylosis without myelopathy or radiculopathy, lumbar region: Secondary | ICD-10-CM | POA: Diagnosis not present

## 2023-06-17 NOTE — Progress Notes (Signed)
Safety precautions to be maintained throughout the outpatient stay will include: orient to surroundings, keep bed in low position, maintain call bell within reach at all times, provide assistance with transfer out of bed and ambulation.  

## 2023-06-17 NOTE — Patient Instructions (Signed)
____________________________________________________________________________________________  New Patients  Welcome to [facility].   Initial Visit The first or initial visit consists of an evaluation only.   Interventional pain management.  We offer therapies other than opioid controlled substances to manage chronic pain. These include, but are not limited to, diagnostic, therapeutic, and palliative specialized injection therapies (i.e.: Epidural Steroids, Facet Blocks, etc.). We specialize in a variety of nerve blocks as well as radiofrequency treatments. We offer pain implant evaluations and trials, as well as follow up management. In addition we also provide a variety joint injections, including Viscosupplementation (AKA: Gel Therapy).  Prescription Pain Medication. We specialize in alternatives to opioids. We can provide evaluations and recommendations for/of pharmacologic therapies based on CDC Guidelines.  We no longer take patients for long-term medication management. We will not be taking over your pain medications.  ____________________________________________________________________________________________    ____________________________________________________________________________________________  Patient Information update  To: All of our patients.  Re: Name change.  It has been made official that our current name, "[facility]"   will soon be changed to "[facility]".   The purpose of this change is to eliminate any confusion created by the concept of our practice being a "Medication Management Pain Clinic". In the past this has led to the misconception that we treat pain primarily by the use of prescription medications.  Nothing can be farther from the truth.   Understanding PAIN MANAGEMENT: To  further understand what our practice does, you first have to understand that "Pain Management" is a subspecialty that requires additional training once a physician has completed their specialty training, which can be in either Anesthesia, Neurology, Psychiatry, or Physical Medicine and Rehabilitation (PMR). Each one of these contributes to the final approach taken by each physician to the management of their patient's pain. To be a "Pain Management Specialist" you must have first completed one of the specialty trainings below.  Anesthesiologists - trained in clinical pharmacology and interventional techniques such as nerve blockade and regional as well as central neuroanatomy. They are trained to block pain before, during, and after surgical interventions.  Neurologists - trained in the diagnosis and pharmacological treatment of complex neurological conditions, such as Multiple Sclerosis, Parkinson's, spinal cord injuries, and other systemic conditions that may be associated with symptoms that may include but are not limited to pain. They tend to rely primarily on the treatment of chronic pain using prescription medications.  Psychiatrist - trained in conditions affecting the psychosocial wellbeing of patients including but not limited to depression, anxiety, schizophrenia, personality disorders, addiction, and other substance use disorders that may be associated with chronic pain. They tend to rely primarily on the treatment of chronic pain using prescription medications.   Physical Medicine and Rehabilitation (PMR) physicians, also known as physiatrists - trained to treat a wide variety of medical conditions affecting the brain, spinal cord, nerves, bones, joints, ligaments, muscles, and tendons. Their training is primarily aimed at treating patients that have suffered injuries that have caused severe physical impairment. Their training is primarily aimed at the physical therapy and rehabilitation of those  patients. They may also work alongside orthopedic surgeons or neurosurgeons using their expertise in assisting surgical patients to recover after their surgeries.  INTERVENTIONAL PAIN MANAGEMENT is sub-subspecialty of Pain Management.  Our physicians are Board-certified in Anesthesia, Pain Management, and Interventional Pain Management.  This meaning that not only have they been trained   and Board-certified in their specialty of Anesthesia, and subspecialty of Pain Management, but they have also received further training in the sub-subspecialty of Interventional Pain Management, in order to become Board-certified as INTERVENTIONAL PAIN MANAGEMENT SPECIALIST.    Mission: Our goal is to use our skills in  INTERVENTIONAL PAIN MANAGEMENT as alternatives to the chronic use of prescription opioid medications for the treatment of pain. To make this more clear, we have changed our name to reflect what we do and offer. We will continue to offer medication management assessment and recommendations, but we will not be taking over any patient's medication management.  ____________________________________________________________________________________________     

## 2023-06-20 LAB — COMPLIANCE DRUG ANALYSIS, UR

## 2023-06-22 ENCOUNTER — Other Ambulatory Visit: Payer: Self-pay

## 2023-06-22 MED ORDER — PAIN MANAGEMENT IT PUMP REFILL: 1.0000 | Freq: Once | INTRATHECAL | 0 refills | Status: AC

## 2023-06-23 ENCOUNTER — Encounter: Payer: Self-pay | Admitting: Oncology

## 2023-06-25 ENCOUNTER — Encounter: Payer: Self-pay | Admitting: Internal Medicine

## 2023-06-25 ENCOUNTER — Ambulatory Visit: Payer: Medicare HMO | Attending: Internal Medicine | Admitting: Internal Medicine

## 2023-06-25 VITALS — BP 106/60 | HR 91 | Ht 58.5 in | Wt 142.4 lb

## 2023-06-25 DIAGNOSIS — E1169 Type 2 diabetes mellitus with other specified complication: Secondary | ICD-10-CM | POA: Diagnosis not present

## 2023-06-25 DIAGNOSIS — I4819 Other persistent atrial fibrillation: Secondary | ICD-10-CM | POA: Diagnosis not present

## 2023-06-25 DIAGNOSIS — I5020 Unspecified systolic (congestive) heart failure: Secondary | ICD-10-CM

## 2023-06-25 DIAGNOSIS — I428 Other cardiomyopathies: Secondary | ICD-10-CM | POA: Diagnosis not present

## 2023-06-25 DIAGNOSIS — E785 Hyperlipidemia, unspecified: Secondary | ICD-10-CM

## 2023-06-25 DIAGNOSIS — I10 Essential (primary) hypertension: Secondary | ICD-10-CM | POA: Diagnosis not present

## 2023-06-25 NOTE — Patient Instructions (Signed)
 Medication Instructions:  Your physician recommends that you continue on your current medications as directed. Please refer to the Current Medication list given to you today.   *If you need a refill on your cardiac medications before your next appointment, please call your pharmacy*   Lab Work: No labs ordered today    Testing/Procedures: No test ordered today    Follow-Up: At [facility], you and your health needs are our priority.  As part of our continuing mission to provide you with exceptional heart care, we have created designated Provider Care Teams.  These Care Teams include your primary Cardiologist (physician) and Advanced Practice Providers (APPs -  Physician Assistants and Nurse Practitioners) who all work together to provide you with the care you need, when you need it.  We recommend signing up for the patient portal called "MyChart".  Sign up information is provided on this After Visit Summary.  MyChart is used to connect with patients for Virtual Visits (Telemedicine).  Patients are able to view lab/test results, encounter notes, upcoming appointments, etc.  Non-urgent messages can be sent to your provider as well.   To learn more about what you can do with MyChart, go to [email or web address].    Your next appointment:   6 month(s)  Provider:   You may see Yvonne Kendall, MD or one of the following Advanced Practice Providers on your designated Care Team:   Nicolasa Ducking, NP Eula Listen, PA-C Cadence Fransico Michael, PA-C Charlsie Quest, NP Carlos Levering, NP

## 2023-06-25 NOTE — Progress Notes (Signed)
Cardiology Office Note:  .   Date:  06/25/2023  ID:  Jordan Chan, DOB 19-Jul-1951, MRN [id number] PCP: Jordan Skiff, NP  [facility] Providers Cardiologist:  Jordan Kendall, MD Electrophysiologist:  Jordan Prude, MD     History of Present Illness: .   Jordan Chan is a 72 y.o. female with history of paroxysmal atrial fibrillation, stress-induced cardiomyopathy in 04/2017 in the setting of pain pump malfunction and opioid withdrawal, PE following hospitalization for pneumonia (summer 2021), chronic low back pain, diabetes mellitus, COPD, GERD, sleep apnea, and IBS who presents for follow-up of atrial fibrillation and stress-induced cardiomyopathy.  I last saw her in March, at which time she was doing well, heart standpoint.  Chronic exertional dyspnea attributed to COPD was stable.  Her main complaint was of left hip pain.  She was seen in the ED in August with gastroenteritis and subsequently hospitalized in September with sepsis due concern for infection of the left hip surgical wound following arthroplasty 2-week earlier.  She was transferred to [facility] for further management by Dr. Odis Chan.  Today, Jordan Chan reports that she is doing fairly well.  She still has some soreness in the left hip but is overall feeling better.  She is now able to walk without a cane or walker.  From a heart standpoint, she is doing well without chest pain, palpitations, lightheadedness, or edema.  Chronic shortness of breath related to her COPD is unchanged from baseline.  She has not had any falls.  She notes that her home blood pressures are typically 97-120 bpm.  She is not symptomatic with initial blood pressure today of 90/52.  She remains on apixaban without bleeding.  ROS: See HPI  Studies Reviewed: [name]   EKG Interpretation Date/Time:  Thursday June 25 2023 08:04:37 EST Ventricular Rate:  91 PR Interval:  162 QRS Duration:  82 QT  Interval:  378 QTC Calculation: 464 R Axis:   -18  Text Interpretation: Sinus rhythm with Premature atrial complexes with Abberant conduction Cannot rule out Anterior infarct (cited on or before 13-Jul-2022) Abnormal ECG When compared with ECG of 24-Nov-2022 21:14, QRS duration has decreased HEART RATE has decreased Confirmed by Jordan Chan [phone]) on 06/25/2023 8:06:35 AM    TTE (10/22/2022): Normal LV size and wall thickness.  LVEF 45-50% with global hypokinesis.  Normal RV size and function.  No significant valvular abnormalities.  Normal CVP.  Risk Assessment/Calculations:             Physical Exam:   VS:  BP 106/60 (BP Location: Left Arm, Cuff Size: Normal)   Pulse 91   Ht 4' 10.5" (1.486 m)   Wt 142 lb 6 oz (64.6 kg)   LMP  (LMP Unknown)   SpO2 92%   BMI 29.25 kg/m    Wt Readings from Last 3 Encounters:  06/25/23 142 lb 6 oz (64.6 kg)  06/17/23 140 lb (63.5 kg)  06/12/23 140 lb 9.6 oz (63.8 kg)    General:  NAD. Neck: No JVD or HJR. Lungs: Mildly diminished breath sounds throughout without wheezes or crackles. Heart: Regular rate and rhythm without murmurs, rubs, or gallops. Abdomen: Soft, nontender, nondistended. Extremities: No lower extremity edema.  ASSESSMENT AND PLAN: .    HFrEF due to nonischemic cardiomyopathy: Jordan Chan appears euvolemic without signs or symptoms of heart failure.  Due to her borderline low blood pressure, we will defer escalation of low-dose metoprolol succinate and losartan.  Persistent atrial  fibrillation: Jordan Chan is in sinus rhythm today.  Continue low-dose metoprolol and apixaban.  Hyperlipidemia associated with type 2 diabetes mellitus: Lipids well-controlled on last check.  Continue atorvastatin and ongoing diabetes management per Jordan Chan.  Hypertension: Blood pressure well-controlled, if anything borderline low at times albeit without symptoms.  Continue low-dose metoprolol succinate and losartan.    Dispo: Return  to clinic in 6 months.  Signed, Jordan Kendall, MD

## 2023-06-30 ENCOUNTER — Ambulatory Visit: Payer: Self-pay | Admitting: *Deleted

## 2023-06-30 NOTE — Patient Outreach (Signed)
  Care Coordination   Follow Up Visit Note   06/30/2023 Name: Jordan Chan MRN: [id number] DOB: 10-03-1950  Jordan Chan is a 72 y.o. year old female who sees Aura Dials T, NP for primary care. I spoke with  Jordan Chan by phone today.  What matters to the patients health and wellness today?  Call was initially successful but patient state she was out to lunch, requested call back later this afternoon.    Call placed again, no answer, voice message left.      SDOH assessments and interventions completed:  No     Care Coordination Interventions:  No, not indicated   Follow up plan:  Will have care guide reschedule    Encounter Outcome:  Patient Visit Completed   Rodney Langton, RN, MSN, CCM [place]  [facility], [facility] RN Care Coordinator Direct Dial: [phone] / Main [phone] Fax [phone] Email: [email or web address]: [email or web address]

## 2023-07-01 LAB — COMP. METABOLIC PANEL (12)
AST: 21 [IU]/L (ref 0–40)
Albumin: 4.2 g/dL (ref 3.8–4.8)
Alkaline Phosphatase: 99 [IU]/L (ref 44–121)
BUN/Creatinine Ratio: 16 (ref 12–28)
BUN: 21 mg/dL (ref 8–27)
Bilirubin Total: <0.2 mg/dL (ref 0.0–1.2)
Calcium: 9.4 mg/dL (ref 8.7–10.3)
Chloride: 94 mmol/L — ABNORMAL LOW (ref 96–106)
Creatinine, Ser: 1.31 mg/dL — ABNORMAL HIGH (ref 0.57–1.00)
Globulin, Total: 3.3 g/dL (ref 1.5–4.5)
Glucose: 264 mg/dL — ABNORMAL HIGH (ref 70–99)
Potassium: 4.5 mmol/L (ref 3.5–5.2)
Sodium: 138 mmol/L (ref 134–144)
Total Protein: 7.5 g/dL (ref 6.0–8.5)
eGFR: 43 mL/min/{1.73_m2} — ABNORMAL LOW (ref >59)

## 2023-07-01 LAB — VITAMIN B12: Vitamin B-12: 983 pg/mL (ref 232–1245)

## 2023-07-01 LAB — 25-HYDROXY VITAMIN D LCMS D2+D3
25-Hydroxy, Vitamin D-2: <1 ng/mL
25-Hydroxy, Vitamin D-3: 62 ng/mL
25-Hydroxy, Vitamin D: 62 ng/mL

## 2023-07-01 LAB — C-REACTIVE PROTEIN: CRP: <1 mg/L (ref 0–10)

## 2023-07-01 LAB — SEDIMENTATION RATE: Sed Rate: 19 mm/h (ref 0–40)

## 2023-07-01 LAB — MAGNESIUM: Magnesium: 1.5 mg/dL — ABNORMAL LOW (ref 1.6–2.3)

## 2023-07-07 ENCOUNTER — Encounter: Payer: Self-pay | Admitting: Pain Medicine

## 2023-07-07 DIAGNOSIS — R937 Abnormal findings on diagnostic imaging of other parts of musculoskeletal system: Secondary | ICD-10-CM | POA: Insufficient documentation

## 2023-07-14 ENCOUNTER — Ambulatory Visit (INDEPENDENT_AMBULATORY_CARE_PROVIDER_SITE_OTHER): Payer: Medicare HMO

## 2023-07-14 DIAGNOSIS — E538 Deficiency of other specified B group vitamins: Secondary | ICD-10-CM

## 2023-07-14 MED ORDER — CYANOCOBALAMIN 1000 MCG/ML IJ SOLN
1000.0000 ug | Freq: Once | INTRAMUSCULAR | Status: AC
  Administered 2023-07-14: 1000 ug via INTRAMUSCULAR

## 2023-07-15 ENCOUNTER — Telehealth: Payer: Self-pay | Admitting: *Deleted

## 2023-07-15 NOTE — Patient Outreach (Signed)
  Care Coordination   Follow Up Visit Note   07/15/2023 Name: SATOYA ROSILLO MRN: [id number] DOB: [birth year]  [name] is a 72 y.o. year old female who sees Aura Dials T, NP for primary care. I spoke with  [name] by phone today.  What matters to the patients health and wellness today?  Report she continues to recover from hip surgery well, no longer using cane or walker for mobility.  State weight and blood sugars have remained stable.  Denies any urgent concerns, encouraged to contact this care manager with questions.      Goals Addressed             This Visit's Progress    Management of chronic medical conditions       Interventions Today    Flowsheet Row Most Recent Value  Chronic Disease   Chronic disease during today's visit Diabetes, Other  [chronic hip pain]  General Interventions   General Interventions Discussed/Reviewed General Interventions Reviewed, Doctor Visits  Doctor Visits Discussed/Reviewed Doctor Visits Reviewed, PCP, Specialist  [upcoming pain clinic 12/31, B-12 shot 1/10, PCP 2/10, and pulmonology 3/5]  PCP/Specialist Visits Compliance with follow-up visit  Exercise Interventions   Exercise Discussed/Reviewed Weight Managment  Weight Management Weight maintenance  Education Interventions   Education Provided Provided Education  Provided Verbal Education On Exercise, Blood Sugar Monitoring, Medication, When to see the doctor              SDOH assessments and interventions completed:  Yes     Care Coordination Interventions:  Yes, provided   Follow up plan: Follow up call scheduled for 2/12    Encounter Outcome:  Patient Visit Completed   Rodney Langton, RN, MSN, CCM [place]  [facility], [facility] RN Care Coordinator Direct Dial: [phone] / Main [phone] Fax [phone] Email: [email or web address]: [email or web address]

## 2023-07-23 ENCOUNTER — Ambulatory Visit
Admission: RE | Admit: 2023-07-23 | Discharge: 2023-07-23 | Disposition: A | Discharge status: Home / Self Care | Payer: Medicare HMO | Source: Ambulatory Visit | Attending: Family Medicine | Admitting: Family Medicine

## 2023-07-23 ENCOUNTER — Telehealth: Payer: Self-pay | Admitting: Nurse Practitioner

## 2023-07-23 ENCOUNTER — Encounter: Payer: Self-pay | Admitting: Family Medicine

## 2023-07-23 ENCOUNTER — Ambulatory Visit
Admission: RE | Admit: 2023-07-23 | Discharge: 2023-07-23 | Disposition: A | Discharge status: Home / Self Care | Payer: Medicare HMO | Attending: Family Medicine | Admitting: Family Medicine

## 2023-07-23 ENCOUNTER — Ambulatory Visit: Payer: Self-pay

## 2023-07-23 ENCOUNTER — Ambulatory Visit (INDEPENDENT_AMBULATORY_CARE_PROVIDER_SITE_OTHER): Payer: Medicare HMO | Admitting: Family Medicine

## 2023-07-23 VITALS — BP 119/71 | HR 103 | Wt 146.6 lb

## 2023-07-23 DIAGNOSIS — R0602 Shortness of breath: Secondary | ICD-10-CM | POA: Diagnosis present

## 2023-07-23 DIAGNOSIS — I509 Heart failure, unspecified: Secondary | ICD-10-CM

## 2023-07-23 NOTE — Progress Notes (Signed)
BP 119/71   Pulse (!) 103   Wt 146 lb 9.6 oz (66.5 kg)   LMP  (LMP Unknown)   SpO2 99%   BMI 30.12 kg/m    Subjective:    [name], female    DOB: [birth year], 72 y.o.   MRN: [id number]  HPI: Jordan Chan is a 72 y.o. female  Chief Complaint  Patient presents with   COPD    Patient says woke up yesterday, and had an episode of shortness of breath. Patient says she notices she can only walk short distances without being winded. Patient says she has used her inhaler and says she feels it did not help. Patient says as long as she is sitting and not talking, she is ok. Patient says only when she is moving, she has the shortness of breath. Patient says she has had 3 bowel movements this morning.    UPPER RESPIRATORY TRACT INFECTION Duration: yesterday Worst symptom: SOB Fever: no Cough: no Shortness of breath: yes Wheezing: no Chest pain: no Chest tightness: no Chest congestion: yes Nasal congestion: no Runny nose: no Post nasal drip: no Sneezing: no Sore throat: no Swollen glands: no Sinus pressure: no Headache: no Face pain: no Toothache: no Ear pain: no  Ear pressure: no  Eyes red/itching:no Eye drainage/crusting: no  Vomiting: no Rash: no Fatigue: yes Sick contacts: no Strep contacts: no  Context: worse Recurrent sinusitis: no Relief with OTC cold/cough medications: no  Treatments attempted: inhalers   Relevant past medical, surgical, family and social history reviewed and updated as indicated. Interim medical history since our last visit reviewed. Allergies and medications reviewed and updated.  Review of Systems  Constitutional:  Positive for fatigue. Negative for activity change, appetite change, chills, diaphoresis, fever and unexpected weight change.  HENT: Negative.    Respiratory:  Positive for shortness of breath. Negative for apnea, cough, choking, chest tightness, wheezing and stridor.   Cardiovascular: Negative.    Gastrointestinal: Negative.   Musculoskeletal: Negative.   Skin: Negative.   Psychiatric/Behavioral: Negative.      Per HPI unless specifically indicated above     Objective:    BP 119/71   Pulse (!) 103   Wt 146 lb 9.6 oz (66.5 kg)   LMP  (LMP Unknown)   SpO2 99%   BMI 30.12 kg/m   Wt Readings from Last 3 Encounters:  07/23/23 146 lb 9.6 oz (66.5 kg)  06/25/23 142 lb 6 oz (64.6 kg)  06/17/23 140 lb (63.5 kg)    Physical Exam Vitals and nursing note reviewed.  Constitutional:      General: She is not in acute distress.    Appearance: Normal appearance. She is not ill-appearing, toxic-appearing or diaphoretic.  HENT:     Head: Normocephalic and atraumatic.     Right Ear: External ear normal.     Left Ear: External ear normal.     Nose: Nose normal.     Mouth/Throat:     Mouth: Mucous membranes are moist.     Pharynx: Oropharynx is clear.  Eyes:     General: No scleral icterus.       Right eye: No discharge.        Left eye: No discharge.     Extraocular Movements: Extraocular movements intact.     Conjunctiva/sclera: Conjunctivae normal.     Pupils: Pupils are equal, round, and reactive to light.  Cardiovascular:     Rate and Rhythm: Normal rate and  regular rhythm.     Pulses: Normal pulses.     Heart sounds: Normal heart sounds. No murmur heard.    No friction rub. No gallop.  Pulmonary:     Effort: Pulmonary effort is normal. No respiratory distress.     Breath sounds: No stridor. Rales (bilateral bases) present. No wheezing or rhonchi.  Chest:     Chest wall: No tenderness.  Musculoskeletal:        General: Normal range of motion.     Cervical back: Normal range of motion and neck supple.  Skin:    General: Skin is warm and dry.     Capillary Refill: Capillary refill takes less than 2 seconds.     Coloration: Skin is not jaundiced or pale.     Findings: No bruising, erythema, lesion or rash.  Neurological:     General: No focal deficit present.      Mental Status: She is alert and oriented to person, place, and time. Mental status is at baseline.  Psychiatric:        Mood and Affect: Mood normal.        Behavior: Behavior normal.        Thought Content: Thought content normal.        Judgment: Judgment normal.     Results for orders placed or performed in visit on 06/17/23  Compliance Drug Analysis, Ur   Collection Time: 06/17/23 11:09 AM  Result Value Ref Range   Summary Note   Comp. Metabolic Panel (12)   Collection Time: 06/17/23 12:49 PM  Result Value Ref Range   Glucose 264 (H) 70 - 99 mg/dL   BUN 21 8 - 27 mg/dL   Creatinine, Ser 4.40 (H) 0.57 - 1.00 mg/dL   eGFR 43 (L) >10 UV/OZD/6.64   BUN/Creatinine Ratio 16 12 - 28   Sodium 138 134 - 144 mmol/L   Potassium 4.5 3.5 - 5.2 mmol/L   Chloride 94 (L) 96 - 106 mmol/L   Calcium 9.4 8.7 - 10.3 mg/dL   Total Protein 7.5 6.0 - 8.5 g/dL   Albumin 4.2 3.8 - 4.8 g/dL   Globulin, Total 3.3 1.5 - 4.5 g/dL   Bilirubin Total <4.0 0.0 - 1.2 mg/dL   Alkaline Phosphatase 99 44 - 121 IU/L   AST 21 0 - 40 IU/L  Magnesium   Collection Time: 06/17/23 12:49 PM  Result Value Ref Range   Magnesium 1.5 (L) 1.6 - 2.3 mg/dL  Vitamin H47   Collection Time: 06/17/23 12:49 PM  Result Value Ref Range   Vitamin B-12 983 232 - 1,245 pg/mL  Sedimentation rate   Collection Time: 06/17/23 12:49 PM  Result Value Ref Range   Sed Rate 19 0 - 40 mm/hr  25-Hydroxy vitamin D Lcms D2+D3   Collection Time: 06/17/23 12:49 PM  Result Value Ref Range   25-Hydroxy, Vitamin D 62 ng/mL   25-Hydroxy, Vitamin D-2 <1.0 ng/mL   25-Hydroxy, Vitamin D-3 62 ng/mL  C-reactive protein   Collection Time: 06/17/23 12:49 PM  Result Value Ref Range   CRP <1 0 - 10 mg/L      Assessment & Plan:   Problem List Items Addressed This Visit   None Visit Diagnoses       SOB (shortness of breath)    -  Primary   Bilateral rales. Lungs otherwise clear. Will check EKG and CXR. Lasix 40mg  daily. Recheck Monday with  PCP.   Relevant Orders   EKG 12-Lead  DG Chest 2 View     Acute congestive heart failure, unspecified heart failure type (HCC)       Will treat with lasix and obtain CXR. Follow up with PCP Monday       EKG interpreted by me today shows NSR at 100bpm. Flipped t-waves in V1. Unchanged from previous.   Follow up plan: Return Monday with PCP or provider in the office for follow up CHF flare.

## 2023-07-23 NOTE — Patient Instructions (Signed)
Take 40mg  lasix daily until Monday when you return

## 2023-07-23 NOTE — Telephone Encounter (Signed)
  Chief Complaint: SOB Symptoms: speaking in short phrases Frequency: Yesterday Pertinent Negatives: Patient denies Any URI s/s Disposition: [] ED /[] Urgent Care (no appt availability in office) / [] Appointment(In office/virtual)/ []  [facility]/ [] Home Care/ [x] Refused Recommended Disposition /[] [facility]/ []  Follow-up with PCP Additional Notes: Advised ED. Pt stated she would not go there. Appt available for this afternoon scheduled at 2pm. Pt states she can get here in 15 minutes. Pt states she must pick up her son at 3pm. PX has COPD and CHF.    Reason for Disposition  [1] MODERATE difficulty breathing (e.g., speaks in phrases, SOB even at rest, pulse 100-120) AND [2] NEW-onset or WORSE than normal  Answer Assessment - Initial Assessment Questions 1. RESPIRATORY STATUS: "Describe your breathing?" (e.g., wheezing, shortness of breath, unable to speak, severe coughing)      BOB 2. ONSET: "When did this breathing problem begin?"      yesterday 3. PATTERN "Does the difficult breathing come and go, or has it been constant since it started?"      Constant with any exertion 4. SEVERITY: "How bad is your breathing?" (e.g., mild, moderate, severe)    - MILD: No SOB at rest, mild SOB with walking, speaks normally in sentences, can lie down, no retractions, pulse < 100.    - MODERATE: SOB at rest, SOB with minimal exertion and prefers to sit, cannot lie down flat, speaks in phrases, mild retractions, audible wheezing, pulse 100-120.    - SEVERE: Very SOB at rest, speaks in single words, struggling to breathe, sitting hunched forward, retractions, pulse > 120      Moderate 5. RECURRENT SYMPTOM: "Have you had difficulty breathing before?" If Yes, ask: "When was the last time?" and "What happened that time?"      yes 6. CARDIAC HISTORY: "Do you have any history of heart disease?" (e.g., heart attack, angina, bypass surgery, angioplasty)      CHF 7. LUNG HISTORY: "Do you  have any history of lung disease?"  (e.g., pulmonary embolus, asthma, emphysema)     COPD 8. CAUSE: "What do you think is causing the breathing problem?"      COPD,  9. OTHER SYMPTOMS: "Do you have any other symptoms? (e.g., dizziness, runny nose, cough, chest pain, fever)     no  Protocols used: Breathing Difficulty-A-AH

## 2023-07-23 NOTE — Telephone Encounter (Signed)
  Patient was seen in office today  and needs to  Return Monday with PCP or provider in the office for follow up CHF flare. Please advise where you would like her to be scheduled or for next available after christmas.

## 2023-07-24 NOTE — Telephone Encounter (Signed)
Appointment has been made

## 2023-07-25 NOTE — Patient Instructions (Signed)
Be Involved in Caring For Your Health:  Taking Medications When medications are taken as directed, they can greatly improve your health. But if they are not taken as prescribed, they may not work. In some cases, not taking them correctly can be harmful. To help ensure your treatment remains effective and safe, understand your medications and how to take them. Bring your medications to each visit for review by your provider.  Your lab results, notes, and after visit summary will be available on My Chart. We strongly encourage you to use this feature. If lab results are abnormal the clinic will contact you with the appropriate steps. If the clinic does not contact you assume the results are satisfactory. You can always view your results on My Chart. If you have questions regarding your health or results, please contact the clinic during office hours. You can also ask questions on My Chart.  We at [facility] are grateful that you chose [place] to provide your care. We strive to provide evidence-based and compassionate care and are always looking for feedback. If you get a survey from the clinic please complete this so we can hear your opinions.  Heart-Healthy Eating Plan Many factors influence your heart health, including eating and exercise habits. Heart health is also called coronary health. Coronary risk increases with abnormal blood fat (lipid) levels. A heart-healthy eating plan includes limiting unhealthy fats, increasing healthy fats, limiting salt (sodium) intake, and making other diet and lifestyle changes. What is my plan? Your health care provider may recommend that: You limit your fat intake to _________% or less of your total calories each day. You limit your saturated fat intake to _________% or less of your total calories each day. You limit the amount of cholesterol in your diet to less than _________ mg per day. You limit the amount of sodium in your diet to less than _________  mg per day. What are tips for following this plan? Cooking Cook foods using methods other than frying. Baking, boiling, grilling, and broiling are all good options. Other ways to reduce fat include: Removing the skin from poultry. Removing all visible fats from meats. Steaming vegetables in water or broth. Meal planning  At meals, imagine dividing your plate into fourths: Fill one-half of your plate with vegetables and green salads. Fill one-fourth of your plate with whole grains. Fill one-fourth of your plate with lean protein foods. Eat 2-4 cups of vegetables per day. One cup of vegetables equals 1 cup (91 g) broccoli or cauliflower florets, 2 medium carrots, 1 large bell pepper, 1 large sweet potato, 1 large tomato, 1 medium white potato, 2 cups (150 g) raw leafy greens. Eat 1-2 cups of fruit per day. One cup of fruit equals 1 small apple, 1 large banana, 1 cup (237 g) mixed fruit, 1 large orange,  cup (82 g) dried fruit, 1 cup (240 mL) 100% fruit juice. Eat more foods that contain soluble fiber. Examples include apples, broccoli, carrots, beans, peas, and barley. Aim to get 25-30 g of fiber per day. Increase your consumption of legumes, nuts, and seeds to 4-5 servings per week. One serving of dried beans or legumes equals  cup (90 g) cooked, 1 serving of nuts is  oz (12 almonds, 24 pistachios, or 7 walnut halves), and 1 serving of seeds equals  oz (8 g). Fats Choose healthy fats more often. Choose monounsaturated and polyunsaturated fats, such as olive and canola oils, avocado oil, flaxseeds, walnuts, almonds, and seeds. Eat  more omega-3 fats. Choose salmon, mackerel, sardines, tuna, flaxseed oil, and ground flaxseeds. Aim to eat fish at least 2 times each week. Check food labels carefully to identify foods with trans fats or high amounts of saturated fat. Limit saturated fats. These are found in animal products, such as meats, butter, and cream. Plant sources of saturated fats  include palm oil, palm kernel oil, and coconut oil. Avoid foods with partially hydrogenated oils in them. These contain trans fats. Examples are stick margarine, some tub margarines, cookies, crackers, and other baked goods. Avoid fried foods. General information Eat more home-cooked food and less restaurant, buffet, and fast food. Limit or avoid alcohol. Limit foods that are high in added sugar and simple starches such as foods made using white refined flour (white breads, pastries, sweets). Lose weight if you are overweight. Losing just 5-10% of your body weight can help your overall health and prevent diseases such as diabetes and heart disease. Monitor your sodium intake, especially if you have high blood pressure. Talk with your health care provider about your sodium intake. Try to incorporate more vegetarian meals weekly. What foods should I eat? Fruits All fresh, canned (in natural juice), or frozen fruits. Vegetables Fresh or frozen vegetables (raw, steamed, roasted, or grilled). Green salads. Grains Most grains. Choose whole wheat and whole grains most of the time. Rice and pasta, including brown rice and pastas made with whole wheat. Meats and other proteins Lean, well-trimmed beef, veal, pork, and lamb. Chicken and [place] without skin. All fish and shellfish. Wild duck, rabbit, pheasant, and venison. Egg whites or low-cholesterol egg substitutes. Dried beans, peas, lentils, and tofu. Seeds and most nuts. Dairy Low-fat or nonfat cheeses, including ricotta and mozzarella. Skim or 1% milk (liquid, powdered, or evaporated). Buttermilk made with low-fat milk. Nonfat or low-fat yogurt. Fats and oils Non-hydrogenated (trans-free) margarines. Vegetable oils, including soybean, sesame, sunflower, olive, avocado, peanut, safflower, corn, canola, and cottonseed. Salad dressings or mayonnaise made with a vegetable oil. Beverages Water (mineral or sparkling). Coffee and tea. Unsweetened ice  tea. Diet beverages. Sweets and desserts Sherbet, gelatin, and fruit ice. Small amounts of dark chocolate. Limit all sweets and desserts. Seasonings and condiments All seasonings and condiments. The items listed above may not be a complete list of foods and beverages you can eat. Contact a dietitian for more options. What foods should I avoid? Fruits Canned fruit in heavy syrup. Fruit in cream or butter sauce. Fried fruit. Limit coconut. Vegetables Vegetables cooked in cheese, cream, or butter sauce. Fried vegetables. Grains Breads made with saturated or trans fats, oils, or whole milk. Croissants. Sweet rolls. Donuts. High-fat crackers, such as cheese crackers and chips. Meats and other proteins Fatty meats, such as hot dogs, ribs, sausage, bacon, rib-eye roast or steak. High-fat deli meats, such as salami and bologna. Caviar. Domestic duck and goose. Organ meats, such as liver. Dairy Cream, sour cream, cream cheese, and creamed cottage cheese. Whole-milk cheeses. Whole or 2% milk (liquid, evaporated, or condensed). Whole buttermilk. Cream sauce or high-fat cheese sauce. Whole-milk yogurt. Fats and oils Meat fat, or shortening. Cocoa butter, hydrogenated oils, palm oil, coconut oil, palm kernel oil. Solid fats and shortenings, including bacon fat, salt pork, lard, and butter. Nondairy cream substitutes. Salad dressings with cheese or sour cream. Beverages Regular sodas and any drinks with added sugar. Sweets and desserts Frosting. Pudding. Cookies. Cakes. Pies. Milk chocolate or white chocolate. Buttered syrups. Full-fat ice cream or ice cream drinks. The items listed above may  not be a complete list of foods and beverages to avoid. Contact a dietitian for more information. Summary Heart-healthy meal planning includes limiting unhealthy fats, increasing healthy fats, limiting salt (sodium) intake and making other diet and lifestyle changes. Lose weight if you are overweight. Losing just  5-10% of your body weight can help your overall health and prevent diseases such as diabetes and heart disease. Focus on eating a balance of foods, including fruits and vegetables, low-fat or nonfat dairy, lean protein, nuts and legumes, whole grains, and heart-healthy oils and fats. This information is not intended to replace advice given to you by your health care provider. Make sure you discuss any questions you have with your health care provider. Document Revised: 08/26/2021 Document Reviewed: 08/26/2021 Elsevier Patient Education  2024 [employer].

## 2023-07-27 ENCOUNTER — Ambulatory Visit (INDEPENDENT_AMBULATORY_CARE_PROVIDER_SITE_OTHER): Payer: Medicare HMO | Admitting: Nurse Practitioner

## 2023-07-27 ENCOUNTER — Encounter: Payer: Self-pay | Admitting: Nurse Practitioner

## 2023-07-27 VITALS — BP 113/69 | HR 90 | Wt 149.2 lb

## 2023-07-27 DIAGNOSIS — R0602 Shortness of breath: Secondary | ICD-10-CM | POA: Diagnosis not present

## 2023-07-27 MED ORDER — FUROSEMIDE 40 MG PO TABS: 20.0000 mg | ORAL_TABLET | Freq: Every day | ORAL | 4 refills | Status: DC

## 2023-07-27 MED ORDER — AMOXICILLIN-POT CLAVULANATE 875-125 MG PO TABS: 1.0000 | ORAL_TABLET | Freq: Two times a day (BID) | ORAL | 0 refills | Status: AC

## 2023-07-27 MED ORDER — PREDNISONE 20 MG PO TABS
40.0000 mg | ORAL_TABLET | Freq: Every day | ORAL | 0 refills | Status: AC
Start: 2023-07-27 — End: 2023-08-01

## 2023-07-27 NOTE — Progress Notes (Signed)
BP 113/69   Pulse 90   Wt 149 lb 3.2 oz (67.7 kg)   LMP  (LMP Unknown)   SpO2 99%   BMI 30.65 kg/m    Subjective:    [name], female    DOB: 1951/05/07, 72 y.o.   MRN: [id number]  HPI: Jordan Chan is a 72 y.o. female  Chief Complaint  Patient presents with   Shortness of Breath    Patient says she feels a little better than last week, but still has some lingering SOB. Patient says she is using her inhalers to help. Patient says she doesn't feel they are helping and she can't walk short distances without having issues.    COPD   COPD Follow-up today due to presentation on 07/23/23 for SOB, was treated with Lasix at time and imaging was performed.  No acute findings on imaging, some mild chronic bilateral interstitial thickening.  She reports Lasix is not helping a whole lot, although SOB is not as bad as it was the other day.  She notices SOB when she is up walking.  When it came on it came on all of a sudden.  Reports no chest pain, but on occasion chest feels heavy.  Had EKG recently with no changes. Does use Trelegy and Albuterol at home.  At baseline rarely uses Albuterol, but currently using a bit more. SOB varies, sometimes there and sometimes not with activity.  Denies cough, edema legs/abdomen, palpitations, chest tightness.   COPD status: ongoing with some improvement Satisfied with current treatment?: yes Oxygen use: yes 2 L at night Dyspnea frequency: yes Cough frequency: baseline Rescue inhaler frequency: 6-8 times since last visit Limitation of activity: yes Productive cough: not at present Last Spirometry: with pulmonary Pneumovax: Up to Date Influenza: Up to Date   Relevant past medical, surgical, family and social history reviewed and updated as indicated. Interim medical history since our last visit reviewed. Allergies and medications reviewed and updated.  Review of Systems  Respiratory:  Positive for shortness of breath.  Negative for cough, chest tightness and wheezing.   Cardiovascular:  Negative for chest pain, palpitations and leg swelling.  Gastrointestinal: Negative.   Neurological: Negative.   Psychiatric/Behavioral: Negative.     Per HPI unless specifically indicated above     Objective:    BP 113/69   Pulse 90   Wt 149 lb 3.2 oz (67.7 kg)   LMP  (LMP Unknown)   SpO2 99%   BMI 30.65 kg/m   Wt Readings from Last 3 Encounters:  07/27/23 149 lb 3.2 oz (67.7 kg)  07/23/23 146 lb 9.6 oz (66.5 kg)  06/25/23 142 lb 6 oz (64.6 kg)    Physical Exam Vitals and nursing note reviewed.  Constitutional:      General: She is awake. She is not in acute distress.    Appearance: Normal appearance. She is well-developed and well-groomed. She is not ill-appearing or toxic-appearing.  HENT:     Head: Normocephalic.     Right Ear: Hearing and external ear normal.     Left Ear: Hearing and external ear normal.  Eyes:     General: Lids are normal.        Right eye: No discharge.        Left eye: No discharge.     Conjunctiva/sclera: Conjunctivae normal.     Pupils: Pupils are equal, round, and reactive to light.  Neck:     Thyroid: No thyromegaly.  Vascular: No carotid bruit.     Comments: Kyphosis present back. Cardiovascular:     Rate and Rhythm: Normal rate and regular rhythm.     Heart sounds: Normal heart sounds. No murmur heard.    No gallop.  Pulmonary:     Effort: Pulmonary effort is normal. No accessory muscle usage or respiratory distress.     Breath sounds: Wheezing present. No decreased breath sounds or rhonchi.     Comments: Intermittent expiratory wheezes noted throughout, no rhonchi on exam today.  No SOB with talking or walking. Abdominal:     General: Bowel sounds are normal. There is no distension.     Palpations: Abdomen is soft.     Tenderness: There is no abdominal tenderness.  Musculoskeletal:     Cervical back: Normal range of motion and neck supple. Torticollis  present.     Right lower leg: No edema.     Left lower leg: No edema.  Lymphadenopathy:     Cervical: No cervical adenopathy.  Skin:    General: Skin is warm and dry.  Neurological:     Mental Status: She is alert and oriented to person, place, and time.     Deep Tendon Reflexes: Reflexes are normal and symmetric.     Reflex Scores:      Brachioradialis reflexes are 2+ on the right side and 2+ on the left side.      Patellar reflexes are 2+ on the right side and 2+ on the left side. Psychiatric:        Attention and Perception: Attention normal.        Mood and Affect: Mood normal.        Speech: Speech normal.        Behavior: Behavior normal. Behavior is cooperative.        Thought Content: Thought content normal.     Results for orders placed or performed in visit on 06/17/23  Compliance Drug Analysis, Ur   Collection Time: 06/17/23 11:09 AM  Result Value Ref Range   Summary Note   Comp. Metabolic Panel (12)   Collection Time: 06/17/23 12:49 PM  Result Value Ref Range   Glucose 264 (H) 70 - 99 mg/dL   BUN 21 8 - 27 mg/dL   Creatinine, Ser 7.25 (H) 0.57 - 1.00 mg/dL   eGFR 43 (L) >36 UY/QIH/4.74   BUN/Creatinine Ratio 16 12 - 28   Sodium 138 134 - 144 mmol/L   Potassium 4.5 3.5 - 5.2 mmol/L   Chloride 94 (L) 96 - 106 mmol/L   Calcium 9.4 8.7 - 10.3 mg/dL   Total Protein 7.5 6.0 - 8.5 g/dL   Albumin 4.2 3.8 - 4.8 g/dL   Globulin, Total 3.3 1.5 - 4.5 g/dL   Bilirubin Total <2.5 0.0 - 1.2 mg/dL   Alkaline Phosphatase 99 44 - 121 IU/L   AST 21 0 - 40 IU/L  Magnesium   Collection Time: 06/17/23 12:49 PM  Result Value Ref Range   Magnesium 1.5 (L) 1.6 - 2.3 mg/dL  Vitamin Z56   Collection Time: 06/17/23 12:49 PM  Result Value Ref Range   Vitamin B-12 983 232 - 1,245 pg/mL  Sedimentation rate   Collection Time: 06/17/23 12:49 PM  Result Value Ref Range   Sed Rate 19 0 - 40 mm/hr  25-Hydroxy vitamin D Lcms D2+D3   Collection Time: 06/17/23 12:49 PM  Result Value  Ref Range   25-Hydroxy, Vitamin D 62 ng/mL   25-Hydroxy, Vitamin  D-2 <1.0 ng/mL   25-Hydroxy, Vitamin D-3 62 ng/mL  C-reactive protein   Collection Time: 06/17/23 12:49 PM  Result Value Ref Range   CRP <1 0 - 10 mg/L      Assessment & Plan:   Problem List Items Addressed This Visit       Other   SOB (shortness of breath) on exertion - Primary   Acute and ongoing, although some mild improvement.  Will extend 40 MG Lasix for 3 more days, educated patient on this + start Prednisone and Augmentin, scripts sent.  She is high risk for PNA as has had around this time of year every year for the past few years. ?exacerbation vs acute HF.  Have advised her if any worsening to immediately go to ER.  To return on Friday for follow-up.        Follow up plan: Return in about 4 days (around 07/31/2023) for COPD + SOB.

## 2023-07-27 NOTE — Assessment & Plan Note (Signed)
Acute and ongoing, although some mild improvement.  Will extend 40 MG Lasix for 3 more days, educated patient on this + start Prednisone and Augmentin, scripts sent.  She is high risk for PNA as has had around this time of year every year for the past few years. ?exacerbation vs acute HF.  Have advised her if any worsening to immediately go to ER.  To return on Friday for follow-up.

## 2023-07-28 ENCOUNTER — Other Ambulatory Visit: Payer: Self-pay | Admitting: Nurse Practitioner

## 2023-07-28 NOTE — Telephone Encounter (Signed)
Requested Prescriptions  Pending Prescriptions Disp Refills   venlafaxine XR (EFFEXOR-XR) 150 MG 24 hr capsule [Pharmacy Med Name: Venlafaxine HCl ER 150 MG Oral Capsule Extended Release 24 Hour] 90 capsule 0    Sig: Take 1 capsule by mouth once daily     Psychiatry: Antidepressants - SNRI - desvenlafaxine & venlafaxine Failed - 07/28/2023  3:30 PM      Failed - Cr in normal range and within 360 days    Creatinine  Date Value Ref Range Status  09/14/2013 0.68 0.60 - 1.30 mg/dL Final   Creatinine, Ser  Date Value Ref Range Status  06/17/2023 1.31 (H) 0.57 - 1.00 mg/dL Final         Failed - Lipid Panel in normal range within the last 12 months    Cholesterol, Total  Date Value Ref Range Status  06/12/2023 130 100 - 199 mg/dL Final   Cholesterol Piccolo, Waived  Date Value Ref Range Status  07/26/2019 127 <200 mg/dL Final    Comment:                            Desirable                <200                         Borderline High      200- 239                         High                     >239    LDL Chol Calc (NIH)  Date Value Ref Range Status  06/12/2023 55 0 - 99 mg/dL Final   HDL  Date Value Ref Range Status  06/12/2023 56 >39 mg/dL Final   Triglycerides  Date Value Ref Range Status  06/12/2023 105 0 - 149 mg/dL Final   Triglycerides Piccolo,Waived  Date Value Ref Range Status  07/26/2019 104 <150 mg/dL Final    Comment:                            Normal                   <150                         Borderline High     150 - 199                         High                200 - 499                         Very High                >499          Passed - Completed PHQ-2 or PHQ-9 in the last 360 days      Passed - Last BP in normal range    BP Readings from Last 1 Encounters:  07/27/23 113/69         Passed - Valid encounter within last 6 months    Recent  Outpatient Visits           Yesterday SOB (shortness of breath) on exertion   [place]  [facility] [place], [place] T, NP   5 days ago SOB (shortness of breath)   [place] [facility], Megan P, DO   1 month ago Insulin dependent type 2 diabetes mellitus (HCC)   [place] [facility] [place], [place] T, NP   4 months ago Insulin dependent type 2 diabetes mellitus (HCC)   [place] [facility] [place], [place] T, NP   5 months ago Insulin dependent type 2 diabetes mellitus (HCC)   [place] [facility] [place], Dorie Rank, NP       Future Appointments             In 3 days [name], Dorie Rank, NP [place] [facility]   In 1 month [place], Dorie Rank, NP [place] [facility], [facility]

## 2023-07-31 ENCOUNTER — Ambulatory Visit (INDEPENDENT_AMBULATORY_CARE_PROVIDER_SITE_OTHER): Payer: Medicare HMO | Admitting: Nurse Practitioner

## 2023-07-31 ENCOUNTER — Encounter: Payer: Self-pay | Admitting: Nurse Practitioner

## 2023-07-31 VITALS — BP 109/64 | HR 98 | Temp 98.0°F | Ht 58.5 in | Wt 144.6 lb

## 2023-07-31 DIAGNOSIS — J449 Chronic obstructive pulmonary disease, unspecified: Secondary | ICD-10-CM | POA: Diagnosis not present

## 2023-07-31 DIAGNOSIS — I5022 Chronic systolic (congestive) heart failure: Secondary | ICD-10-CM

## 2023-07-31 DIAGNOSIS — R0602 Shortness of breath: Secondary | ICD-10-CM | POA: Diagnosis not present

## 2023-07-31 NOTE — Patient Instructions (Signed)

## 2023-07-31 NOTE — Assessment & Plan Note (Signed)
 Chronic, stable. Rare use of Albuterol. Continue Trelegy which offers benefit of medication minimization and has benefited symptoms.  Continue to collaborate with Dr. Meredeth Ide, recent notes reviewed.  No current daytime O2, at night only.

## 2023-07-31 NOTE — Progress Notes (Signed)
BP 109/64   Pulse 98   Temp 98 F (36.7 C) (Oral)   Ht 4' 10.5" (1.486 m)   Wt 144 lb 9.6 oz (65.6 kg)   LMP  (LMP Unknown)   SpO2 90%   BMI 29.71 kg/m    Subjective:    [name], female    DOB: Jun 14, 1951, 72 y.o.   MRN: [id number]  HPI: Jordan Chan is a 72 y.o. female  Chief Complaint  Patient presents with   Shortness of Breath   COPD Follow-up today due to presentation on 07/23/23 for SOB, was treated with Lasix at time and imaging was performed.  No acute findings on imaging, some mild chronic bilateral interstitial thickening.  Returned again on 07/27/23 when we extended Lasix increase and added on abx therapy + Prednisone.  She  reports feeling better today with minimal SOB + chest is no longer heavy. Does use Trelegy and Albuterol at home.  At baseline rarely uses Albuterol and has reduced use since is feeling better.  Has not used it in 3-4 days. COPD status: stable Satisfied with current treatment?: yes Oxygen use: yes 2 L at night Dyspnea frequency: improved Cough frequency: baseline Rescue inhaler frequency: has not used in 3-4 days Limitation of activity: yes Productive cough: no Last Spirometry: with pulmonary Pneumovax: Up to Date Influenza: Up to Date   Relevant past medical, surgical, family and social history reviewed and updated as indicated. Interim medical history since our last visit reviewed. Allergies and medications reviewed and updated.  Review of Systems  Respiratory:  Positive for shortness of breath. Negative for cough, chest tightness and wheezing.   Cardiovascular:  Negative for chest pain, palpitations and leg swelling.  Gastrointestinal: Negative.   Neurological: Negative.   Psychiatric/Behavioral: Negative.     Per HPI unless specifically indicated above     Objective:    BP 109/64   Pulse 98   Temp 98 F (36.7 C) (Oral)   Ht 4' 10.5" (1.486 m)   Wt 144 lb 9.6 oz (65.6 kg)   LMP  (LMP Unknown)    SpO2 90%   BMI 29.71 kg/m   Wt Readings from Last 3 Encounters:  07/31/23 144 lb 9.6 oz (65.6 kg)  07/27/23 149 lb 3.2 oz (67.7 kg)  07/23/23 146 lb 9.6 oz (66.5 kg)    Physical Exam Vitals and nursing note reviewed.  Constitutional:      General: She is awake. She is not in acute distress.    Appearance: Normal appearance. She is well-developed and well-groomed. She is not ill-appearing or toxic-appearing.  HENT:     Head: Normocephalic.     Right Ear: Hearing and external ear normal.     Left Ear: Hearing and external ear normal.  Eyes:     General: Lids are normal.        Right eye: No discharge.        Left eye: No discharge.     Conjunctiva/sclera: Conjunctivae normal.     Pupils: Pupils are equal, round, and reactive to light.  Neck:     Thyroid: No thyromegaly.     Vascular: No carotid bruit.     Comments: Kyphosis present back. Cardiovascular:     Rate and Rhythm: Normal rate and regular rhythm.     Heart sounds: Normal heart sounds. No murmur heard.    No gallop.  Pulmonary:     Effort: Pulmonary effort is normal. No accessory muscle usage  or respiratory distress.     Breath sounds: Normal breath sounds. No decreased breath sounds, wheezing or rhonchi.     Comments: Lung sounds much improved with no abnormal.  Clear throughout. Abdominal:     General: Bowel sounds are normal. There is no distension.     Palpations: Abdomen is soft.     Tenderness: There is no abdominal tenderness.  Musculoskeletal:     Cervical back: Normal range of motion and neck supple. Torticollis present.     Right lower leg: No edema.     Left lower leg: No edema.  Lymphadenopathy:     Cervical: No cervical adenopathy.  Skin:    General: Skin is warm and dry.  Neurological:     Mental Status: She is alert and oriented to person, place, and time.     Deep Tendon Reflexes: Reflexes are normal and symmetric.     Reflex Scores:      Brachioradialis reflexes are 2+ on the right side and 2+  on the left side.      Patellar reflexes are 2+ on the right side and 2+ on the left side. Psychiatric:        Attention and Perception: Attention normal.        Mood and Affect: Mood normal.        Speech: Speech normal.        Behavior: Behavior normal. Behavior is cooperative.        Thought Content: Thought content normal.     Results for orders placed or performed in visit on 06/17/23  Compliance Drug Analysis, Ur   Collection Time: 06/17/23 11:09 AM  Result Value Ref Range   Summary Note   Comp. Metabolic Panel (12)   Collection Time: 06/17/23 12:49 PM  Result Value Ref Range   Glucose 264 (H) 70 - 99 mg/dL   BUN 21 8 - 27 mg/dL   Creatinine, Ser 1.47 (H) 0.57 - 1.00 mg/dL   eGFR 43 (L) >82 NF/AOZ/3.08   BUN/Creatinine Ratio 16 12 - 28   Sodium 138 134 - 144 mmol/L   Potassium 4.5 3.5 - 5.2 mmol/L   Chloride 94 (L) 96 - 106 mmol/L   Calcium 9.4 8.7 - 10.3 mg/dL   Total Protein 7.5 6.0 - 8.5 g/dL   Albumin 4.2 3.8 - 4.8 g/dL   Globulin, Total 3.3 1.5 - 4.5 g/dL   Bilirubin Total <6.5 0.0 - 1.2 mg/dL   Alkaline Phosphatase 99 44 - 121 IU/L   AST 21 0 - 40 IU/L  Magnesium   Collection Time: 06/17/23 12:49 PM  Result Value Ref Range   Magnesium 1.5 (L) 1.6 - 2.3 mg/dL  Vitamin H84   Collection Time: 06/17/23 12:49 PM  Result Value Ref Range   Vitamin B-12 983 232 - 1,245 pg/mL  Sedimentation rate   Collection Time: 06/17/23 12:49 PM  Result Value Ref Range   Sed Rate 19 0 - 40 mm/hr  25-Hydroxy vitamin D Lcms D2+D3   Collection Time: 06/17/23 12:49 PM  Result Value Ref Range   25-Hydroxy, Vitamin D 62 ng/mL   25-Hydroxy, Vitamin D-2 <1.0 ng/mL   25-Hydroxy, Vitamin D-3 62 ng/mL  C-reactive protein   Collection Time: 06/17/23 12:49 PM  Result Value Ref Range   CRP <1 0 - 10 mg/L      Assessment & Plan:   Problem List Items Addressed This Visit       Cardiovascular and Mediastinum   Chronic HFrEF (heart  failure with reduced ejection fraction) (HCC) -  Primary   Chronic, ongoing with most recent EF 45-50% (10/22/22), followed by cardiology.  Euvolemic today.  Continue current medication regimen and collaboration with cardiology.  Recommend: - Reminded to call for an overnight weight gain of >2 pounds or a weekly weight gain of >5 pounds - not adding salt to food and read food labels. Reviewed the importance of keeping daily sodium intake to 2000mg  daily - Avoid Ibuprofen products        Respiratory   COPD, moderate (HCC)   Chronic, stable. Rare use of Albuterol. Continue Trelegy which offers benefit of medication minimization and has benefited symptoms.  Continue to collaborate with Dr. Meredeth Ide, recent notes reviewed.  No current daytime O2, at night only.          Other   SOB (shortness of breath) on exertion   Acute and improved at this time.  ?exacerbation vs acute HF.  Monitor closely.        Follow up plan: Return for as scheduled February 10th.

## 2023-07-31 NOTE — Assessment & Plan Note (Signed)
Acute and improved at this time.  ?exacerbation vs acute HF.  Monitor closely.

## 2023-07-31 NOTE — Assessment & Plan Note (Signed)
Chronic, ongoing with most recent EF 45-50% (10/22/22), followed by cardiology.  Euvolemic today.  Continue current medication regimen and collaboration with cardiology.  Recommend: - Reminded to call for an overnight weight gain of >2 pounds or a weekly weight gain of >5 pounds - not adding salt to food and read food labels. Reviewed the importance of keeping daily sodium intake to 2000mg  daily - Avoid Ibuprofen products

## 2023-08-04 ENCOUNTER — Encounter: Payer: Self-pay | Admitting: Pain Medicine

## 2023-08-04 ENCOUNTER — Ambulatory Visit: Payer: Medicare HMO | Attending: Pain Medicine | Admitting: Pain Medicine

## 2023-08-04 VITALS — BP 130/61 | HR 103 | Temp 98.2°F | Resp 18 | Ht 58.5 in | Wt 144.0 lb

## 2023-08-04 DIAGNOSIS — M79604 Pain in right leg: Secondary | ICD-10-CM | POA: Insufficient documentation

## 2023-08-04 DIAGNOSIS — R937 Abnormal findings on diagnostic imaging of other parts of musculoskeletal system: Secondary | ICD-10-CM | POA: Diagnosis present

## 2023-08-04 DIAGNOSIS — E1142 Type 2 diabetes mellitus with diabetic polyneuropathy: Secondary | ICD-10-CM | POA: Insufficient documentation

## 2023-08-04 DIAGNOSIS — Z978 Presence of other specified devices: Secondary | ICD-10-CM | POA: Insufficient documentation

## 2023-08-04 DIAGNOSIS — M5410 Radiculopathy, site unspecified: Secondary | ICD-10-CM | POA: Insufficient documentation

## 2023-08-04 DIAGNOSIS — G894 Chronic pain syndrome: Secondary | ICD-10-CM | POA: Insufficient documentation

## 2023-08-04 DIAGNOSIS — M51372 Other intervertebral disc degeneration, lumbosacral region with discogenic back pain and lower extremity pain: Secondary | ICD-10-CM | POA: Diagnosis present

## 2023-08-04 DIAGNOSIS — F1120 Opioid dependence, uncomplicated: Secondary | ICD-10-CM | POA: Insufficient documentation

## 2023-08-04 DIAGNOSIS — M25552 Pain in left hip: Secondary | ICD-10-CM | POA: Insufficient documentation

## 2023-08-04 DIAGNOSIS — G8929 Other chronic pain: Secondary | ICD-10-CM | POA: Insufficient documentation

## 2023-08-04 DIAGNOSIS — M79605 Pain in left leg: Secondary | ICD-10-CM | POA: Insufficient documentation

## 2023-08-04 DIAGNOSIS — Z451 Encounter for adjustment and management of infusion pump: Secondary | ICD-10-CM | POA: Diagnosis present

## 2023-08-04 DIAGNOSIS — Z79891 Long term (current) use of opiate analgesic: Secondary | ICD-10-CM | POA: Diagnosis present

## 2023-08-04 DIAGNOSIS — Z79899 Other long term (current) drug therapy: Secondary | ICD-10-CM | POA: Insufficient documentation

## 2023-08-04 DIAGNOSIS — M545 Low back pain, unspecified: Secondary | ICD-10-CM | POA: Diagnosis not present

## 2023-08-04 MED ORDER — NALOXONE HCL 4 MG/0.1ML NA LIQD: 1.0000 | NASAL | 1 refills | Status: AC | PRN

## 2023-08-04 NOTE — Progress Notes (Signed)
Safety precautions to be maintained throughout the outpatient stay will include: orient to surroundings, keep bed in low position, maintain call bell within reach at all times, provide assistance with transfer out of bed and ambulation.  

## 2023-08-04 NOTE — Progress Notes (Signed)
 PROVIDER NOTE: Interpretation of information contained herein should be left to medically-trained personnel. Specific patient instructions are provided elsewhere under Patient Instructions section of medical record. This document was created in part using STT-dictation technology, any transcriptional errors that may result from this process are unintentional.  Patient: Jordan Chan Type: Established DOB: Jun 23, 1951 MRN: [id number] PCP: [name], NP  Service: Procedure DOS: 08/04/2023 Setting: Ambulatory Location: Ambulatory outpatient facility Delivery: Face-to-face Provider: Eric DELENA Como, MD Specialty: Interventional Pain Management Specialty designation: 09 Location: Outpatient facility Ref. Prov.: [name], Jolene T, NP       Interventional Therapy   Primary Reason for Visit: Interventional Pain Management Treatment. CC: Back Pain (low)  Procedure:          Type: Management of Intrathecal Drug Delivery System (IDDS) - Reservoir Refill (04009). No rate change.  Indications: 1. Chronic pain syndrome   2. Chronic low back pain (1ry area of Pain) (Bilateral) (R>L) w/o sciatica   3. Chronic lower extremity pain (2ry area of Pain) (Bilateral) (R>L)   4. Chronic radicular pain of lower extremity   5. Chronic hip pain (Left)   6. DDD (degenerative disc disease), lumbosacral w/ LBP & LEP   7. Diabetic peripheral neuropathy (HCC)   8. Abnormal MRI, lumbar spine (08/26/2019)   9. Presence of intrathecal pump   10. Encounter for adjustment and management of infusion pump   11. Encounter for medication management   12. Encounter for chronic pain management    Pain Assessment: Self-Reported Pain Score: 5 /10             Reported level is compatible with observation.          IDDS (Intrathecal Drug Delivery System):   Device:  Manufacturer: Medtronic Synchromed II Model: [id number] Serial No.: [id number] Type: Programmable  Volume: 40 mL reservoir Catheter  Placement: Intrathecal Catheter Level: TBD MRI compatibility: Yes   Implant Details:  Implant Date: 05/27/2017 Implanter: Lonni Charleston, MD [phone] Delivery Route: Intrathecal Site: Abdominal Laterality:  TBD   Content:  1ry Medication Class: Opioid 1ry Medication (Concentration): PF-Morphine  (5.0 mg/mL) 2ry Medication (Concentration): PF-Bupivacaine (20.0 mg/mL) 3ry Medication (Concentration): PF-Clonidine  (100.0 mcg/mL)  PTM parameters:  Mode: Off/Inactive Bolus Dose: N/A Duration: N/A (min) Max. No.: N/A (bolus/day). Lock-out time (interval): N/A (hrs) Max. Daily bolus dose: N/A (mg/day) Total max. Daily dose (basal + bolus): N/A (mg/day)  Programming:  Type: Simple continuous.  Medication, Concentration, Infusion Program, & Delivery Rate: For up-to-date details please see most recent scanned programming printout.   Changes:  Medication Change: None at this point Rate Change: No change in rate  Reported side-effects or adverse reactions: None reported  Effectiveness: Described as relatively effective, allowing for increase in activities of daily living (ADL) Clinically meaningful improvement in function (CMIF): Sustained CMIF goals met  Plan: Pump refill today   Pharmacotherapy Assessment  Opioid Analgesic: morphine  sulfate powder (for intrathecal pump use)  MME/day: 2.5 mg/day (Intrathecal)   Monitoring: [place] PMP: PDMP reviewed during this encounter.       Pharmacotherapy: No side-effects or adverse reactions reported. Compliance: No problems identified. Effectiveness: Clinically acceptable. Plan: Refer to POC. UDS:  Summary  Date Value Ref Range Status  06/17/2023 Note  Final    Comment:    ==================================================================== Compliance Drug Analysis, Ur ==================================================================== Test                             Result  Flag       Units  Drug Present and  Declared for Prescription Verification   Gabapentin                      PRESENT      EXPECTED   Venlafaxine                     PRESENT      EXPECTED   Desmethylvenlafaxine           PRESENT      EXPECTED    Desmethylvenlafaxine is an expected metabolite of venlafaxine .    Metoprolol                      PRESENT      EXPECTED  Drug Present not Declared for Prescription Verification   Morphine                        1930         UNEXPECTED ng/mg creat    Potential sources of morphine  include administration of codeine or    morphine , use of heroin, or ingestion of poppy seeds.  Drug Absent but Declared for Prescription Verification   Hydrocodone                     Not Detected UNEXPECTED ng/mg creat   Cyclobenzaprine                 Not Detected UNEXPECTED   Acetaminophen                   Not Detected UNEXPECTED    Acetaminophen , as indicated in the declared medication list, is not    always detected even when used as directed.  ==================================================================== Test                      Result    Flag   Units      Ref Range   Creatinine              97               mg/dL      >=79 ==================================================================== Declared Medications:  The flagging and interpretation on this report are based on the  following declared medications.  Unexpected results may arise from  inaccuracies in the declared medications.   **Note: The testing scope of this panel includes these medications:   Cyclobenzaprine  (Flexeril )  Gabapentin   Hydrocodone  (Norco)  Metoprolol  (Toprol )  Venlafaxine  (Effexor )   **Note: The testing scope of this panel does not include small to  moderate amounts of these reported medications:   Acetaminophen  (Norco)   **Note: The testing scope of this panel does not include the  following reported medications:   Albuterol   Apixaban  (Eliquis )  Atorvastatin  (Lipitor)  Buspirone  (Buspar )  Dulaglutide   (Trulicity )  Fluticasone  (Trelegy)  Folic Acid   Furosemide  (Lasix )  Insulin  (Tresiba )  Losartan  (Cozaar )  Metformin   Montelukast   Ondansetron  (Zofran )  Pantoprazole  (Protonix )  Umeclidinium (Trelegy)  Vilanterol (Trelegy)  Vitamin B12  Vitamin D3 ==================================================================== For clinical consultation, please call [phone]. ====================================================================    No results found for: CBDTHCR, D8THCCBX, D9THCCBX   H&P (Pre-op Assessment):  Ms. Patella is a 72 y.o. (year old), female patient, seen today for interventional treatment. She  has a past surgical history that includes Abdominal hysterectomy (1987); Elbow arthroscopy with tendon reconstruction; morphine  pump (2011); Plantar fascia  release; Esophagogastroduodenoscopy (egd) with propofol  (N/A, 01/01/2017); Colonoscopy with propofol  (N/A, 01/01/2017); LEFT HEART CATH AND CORONARY ANGIOGRAPHY (N/A, 04/28/2017); Mastectomy (Right, 06/1997); Back surgery; Breast surgery (Right); Cardiac catheterization; Trigger finger release; Cataract extraction w/PHACO (Right, 05/19/2019); Intrathecal pump implant; Coccyx removal; Cataract extraction w/PHACO (Left, 06/16/2019); Colonoscopy with propofol  (N/A, 12/11/2021); Esophagogastroduodenoscopy (egd) with propofol  (N/A, 12/11/2021); and Total hip arthroplasty (Left). Ms. Newhouse has a current medication list which includes the following prescription(s): accu-chek aviva plus, accu-chek softclix lancets, albuterol , albuterol , dropsafe alcohol  prep, apixaban , atorvastatin , true metrix meter, buspirone , cholecalciferol , freestyle libre 2 reader, freestyle libre 2 sensor, cyanocobalamin , cyclobenzaprine , trelegy ellipta , folic acid , furosemide , gabapentin , hydrocodone -acetaminophen , insulin  lispro, losartan , metformin , metoprolol  succinate, montelukast , naloxone , easy air compressor nebulizer, ondansetron , oxygen -helium,  PAIN MANAGEMENT INTRATHECAL, IT, PUMP, pantoprazole , tresiba  flextouch, trulicity , and venlafaxine  xr. Her primarily concern today is the Back Pain (low)  Initial Vital Signs:  Pulse/HCG Rate: (!) 103  Temp: 98.2 F (36.8 C) Resp: 18 BP: 130/61 SpO2: 100 %  BMI: Estimated body mass index is 29.58 kg/m as calculated from the following:   Height as of this encounter: 4' 10.5 (1.486 m).   Weight as of this encounter: 144 lb (65.3 kg).  Risk Assessment: Allergies: Reviewed. She is allergic to other, pain patch [menthol], avelox  [moxifloxacin  hcl in nacl], doxycycline , erythromycin , fentanyl , moxifloxacin  hcl, oxycontin [oxycodone], and ozempic [semaglutide].  Allergy Precautions: None required Coagulopathies: Reviewed. None identified.  Blood-thinner therapy: None at this time Active Infection(s): Reviewed. None identified. Ms. Kindel is afebrile  Site Confirmation: Ms. Kaczmarczyk was asked to confirm the procedure and laterality before marking the site Procedure checklist: Completed Consent: Before the procedure and under the influence of no sedative(s), amnesic(s), or anxiolytics, the patient was informed of the treatment options, risks and possible complications. To fulfill our ethical and legal obligations, as recommended by the American Medical Association's Code of Ethics, I have informed the patient of my clinical impression; the nature and purpose of the treatment or procedure; the risks, benefits, and possible complications of the intervention; the alternatives, including doing nothing; the risk(s) and benefit(s) of the alternative treatment(s) or procedure(s); and the risk(s) and benefit(s) of doing nothing.  Ms. Starrett was provided with information about the general risks and possible complications associated with most interventional procedures. These include, but are not limited to: failure to achieve desired goals, infection, bleeding, organ or nerve damage, allergic reactions,  paralysis, and/or death.  In addition, she was informed of those risks and possible complications associated to this particular procedure, which include, but are not limited to: damage to the implant; failure to decrease pain; local, systemic, or serious CNS infections, intraspinal abscess with possible cord compression and paralysis, or life-threatening such as meningitis; bleeding; organ damage; nerve injury or damage with subsequent sensory, motor, and/or autonomic system dysfunction, resulting in transient or permanent pain, numbness, and/or weakness of one or several areas of the body; allergic reactions, either minor or major life-threatening, such as anaphylactic or anaphylactoid reactions.  Furthermore, Ms. Printup was informed of those risks and complications associated with the medications. These include, but are not limited to: allergic reactions (i.e.: anaphylactic or anaphylactoid reactions); endorphine suppression; bradycardia and/or hypotension; water  retention and/or peripheral vascular relaxation leading to lower extremity edema and possible stasis ulcers; respiratory depression and/or shortness of breath; decreased metabolic rate leading to weight gain; swelling or edema; medication-induced neural toxicity; particulate matter embolism and blood vessel occlusion with resultant organ, and/or nervous system infarction; and/or intrathecal granuloma formation with possible spinal cord  compression and permanent paralysis.  Before refilling the pump Ms. Sklar was informed that some of the medications used in the devise may not be FDA approved for such use and therefore it constitutes an off-label use of the medications.  Finally, she was informed that Medicine is not an exact science; therefore, there is also the possibility of unforeseen or unpredictable risks and/or possible complications that may result in a catastrophic outcome. The patient indicated having understood very clearly. We have  given the patient no guarantees and we have made no promises. Enough time was given to the patient to ask questions, all of which were answered to the patient's satisfaction. Ms. Reder has indicated that she wanted to continue with the procedure. Attestation: I, the ordering provider, attest that I have discussed with the patient the benefits, risks, side-effects, alternatives, likelihood of achieving goals, and potential problems during recovery for the procedure that I have provided informed consent. Date  Time: 08/04/2023 12:45 PM  Pre-Procedure Preparation:  Monitoring: As per clinic protocol. Respiration, ETCO2, SpO2, BP, heart rate and rhythm monitor placed and checked for adequate function Safety Precautions: Patient was assessed for positional comfort and pressure points before starting the procedure. Time-out: I initiated and conducted the Time-out before starting the procedure, as per protocol. The patient was asked to participate by confirming the accuracy of the Time Out information. Verification of the correct person, site, and procedure were performed and confirmed by me, the nursing staff, and the patient. Time-out conducted as per Joint Commission's Universal Protocol (UP.01.01.01). Time: 1304 Start Time: 1304 hrs.  Description of Procedure:          Position: Supine Target Area: Central-port of intrathecal pump. Approach: Anterior, 90 degree angle approach. Area Prepped: Entire Area around the pump implant. ChloraPrep (2% chlorhexidine  gluconate and 70% isopropyl alcohol ) Safety Precautions: Aspiration looking for blood return was conducted prior to all injections. At no point did we inject any substances, as a needle was being advanced. No attempts were made at seeking any paresthesias. Safe injection practices and needle disposal techniques used. Medications properly checked for expiration dates. SDV (single dose vial) medications used. Description of the Procedure:  Protocol guidelines were followed. Two nurses trained to do implant refills were present during the entire procedure. The refill medication was checked by both healthcare providers as well as the patient. The patient was included in the Time-out to verify the medication. The patient was placed in position. The pump was identified. The area was prepped in the usual manner. The sterile template was positioned over the pump, making sure the side-port location matched that of the pump. Both, the pump and the template were held for stability. The needle provided in the Medtronic Kit was then introduced thru the center of the template and into the central port. The pump content was aspirated and discarded volume documented. The new medication was slowly infused into the pump, thru the filter, making sure to avoid overpressure of the device. The needle was then removed and the area cleansed, making sure to leave some of the prepping solution back to take advantage of its long term bactericidal properties. The pump was interrogated and programmed to reflect the correct medication, volume, and dosage. The program was printed and taken to the physician for approval. Once checked and signed by the physician, a copy was provided to the patient and another scanned into the EMR.  Vitals:   08/04/23 1242  BP: 130/61  Pulse: (!) 103  Resp: 18  Temp: 98.2 F (36.8 C)  TempSrc: Temporal  SpO2: 100%  Weight: 144 lb (65.3 kg)  Height: 4' 10.5 (1.486 m)    Start Time: 1304 hrs. End Time: 1310 hrs. Materials & Medications: Medtronic Refill Kit Medication(s): Please see chart orders for details.  Type of Imaging Technique: None used Indication(s): N/A Exposure Time: No patient exposure Contrast: None used. Fluoroscopic Guidance: N/A Ultrasound Guidance: N/A Interpretation: N/A  Antibiotic Prophylaxis:   Anti-infectives (From admission, onward)    None      Indication(s): None  identified  Post-operative Assessment:  Post-procedure Vital Signs:  Pulse/HCG Rate: (!) 103  Temp: 98.2 F (36.8 C) Resp: 18 BP: 130/61 SpO2: 100 %  EBL: None  Complications: No immediate post-treatment complications observed by team, or reported by patient.  Note: The patient tolerated the entire procedure well. A repeat set of vitals were taken after the procedure and the patient was kept under observation following institutional policy, for this type of procedure. Post-procedural neurological assessment was performed, showing return to baseline, prior to discharge. The patient was provided with post-procedure discharge instructions, including a section on how to identify potential problems. Should any problems arise concerning this procedure, the patient was given instructions to immediately contact us , at any time, without hesitation. In any case, we plan to contact the patient by telephone for a follow-up status report regarding this interventional procedure.  Comments:  No additional relevant information.  Plan of Care (POC)  Orders:  Orders Placed This Encounter  Procedures   PUMP REFILL    Maintain Protocol by having two(2) healthcare providers during procedure and programming.    Scheduling Instructions:     Please refill intrathecal pump today.    Where will this procedure be performed?:   [facility]   PUMP REFILL    Whenever possible schedule on a procedure today.    Standing Status:   Future    Expiration Date:   12/02/2023    Scheduling Instructions:     Please schedule intrathecal pump refill based on pump programming. Avoid schedule intervals of more than 120 days (4 months).    Where will this procedure be performed?:   [facility]   Informed Consent Details: Physician/Practitioner Attestation; Transcribe to consent form and obtain patient signature    Transcribe to consent form and obtain patient signature.    Physician/Practitioner attestation  of informed consent for procedure/surgical case:   I, the physician/practitioner, attest that I have discussed with the patient the benefits, risks, side effects, alternatives, likelihood of achieving goals and potential problems during recovery for the procedure that I have provided informed consent.    Procedure:   Intrathecal pump refill    Physician/Practitioner performing the procedure:   Attending Physician: Damascus Feldpausch A. Tanya, MD & designated trained staff    Indication/Reason:   Chronic Pain Syndrome (G89.4), presence of an intrathecal pump (Z97.8)   Chronic Opioid Analgesic:   morphine  sulfate powder (for intrathecal pump use) (last filled on 05/25/2023) MME/day: 2.5 mg/day (Intrathecal)   Medications ordered for procedure: Meds ordered this encounter  Medications   naloxone  (NARCAN ) nasal spray 4 mg/0.1 mL    Sig: Place 1 spray into the nose as needed for up to 365 doses (for opioid-induced respiratory depresssion). In case of emergency (overdose), spray once into each nostril. If no response within 3 minutes, repeat application and call 911.    Dispense:  1 each    Refill:  1    Instruct patient  in proper use of device.   Medications administered: Barnie MICAEL Ku had no medications administered during this visit.  See the medical record for exact dosing, route, and time of administration.  Follow-up plan:   Return for Pump Refill (Max:47mo).       Interventional Therapies  Risk Factors  Considerations  Medical Comorbidities:     Planned  Pending:      Under consideration:   Pending   Completed:   Surgery: Intrathecal pump implant #1 (*)    Therapeutic  Palliative (PRN) options:   None established   Completed by other providers:   Surgery: Intrathecal pump replacement (05/27/2017) by Nancey Lonni LABOR, MD ([facility])      Recent Visits Date Type Provider Dept  06/17/23 Office Visit Tanya Glisson, MD [facility]   Showing recent visits within past 90 days and meeting all other requirements Today's Visits Date Type Provider Dept  08/04/23 Procedure visit Tanya Glisson, MD [facility]  Showing today's visits and meeting all other requirements Future Appointments Date Type Provider Dept  10/08/23 Appointment Tanya Glisson, MD [facility]  Showing future appointments within next 90 days and meeting all other requirements  Disposition: Discharge home  Discharge (Date  Time): 08/04/2023; 1314 hrs.   Primary Care Physician: [name], Jolene T, NP Location: [facility] Note by: Glisson LABOR Tanya, MD (TTS technology used. I apologize for any typographical errors that were not detected and corrected.) Date: 08/04/2023; Time: 2:18 PM  Disclaimer:  Medicine is not an Visual merchandiser. The only guarantee in medicine is that nothing is guaranteed. It is important to note that the decision to proceed with this intervention was based on the information collected from the patient. The Data and conclusions were drawn from the patient's questionnaire, the interview, and the physical examination. Because the information was provided in large part by the patient, it cannot be guaranteed that it has not been purposely or unconsciously manipulated. Every effort has been made to obtain as much relevant data as possible for this evaluation. It is important to note that the conclusions that lead to this procedure are derived in large part from the available data. Always take into account that the treatment will also be dependent on availability of resources and existing treatment guidelines, considered by other Pain Management Practitioners as being common knowledge and practice, at the time of the intervention. For Medico-Legal purposes, it is also important to point out that variation in procedural techniques and pharmacological choices are the acceptable norm. The  indications, contraindications, technique, and results of the above procedure should only be interpreted and judged by a Board-Certified Interventional Pain Specialist with extensive familiarity and expertise in the same exact procedure and technique.

## 2023-08-04 NOTE — Patient Instructions (Signed)
 Pain Management Discharge Instructions  General Discharge Instructions :  If you need to reach your doctor call: Monday-Friday 8:00 am - 4:00 pm at [phone] or toll free [phone].  After clinic hours [phone] to have operator reach doctor.  Bring all of your medication bottles to all your appointments in the pain clinic.  To cancel or reschedule your appointment with Pain Management please remember to call 24 hours in advance to avoid a fee.  Refer to the educational materials which you have been given on: General Risks, I had my Procedure. Discharge Instructions, Post Sedation.  Post Procedure Instructions:  The drugs you were given will stay in your system until tomorrow, so for the next 24 hours you should not drive, make any legal decisions or drink any alcoholic beverages.  You may eat anything you prefer, but it is better to start with liquids then soups and crackers, and gradually work up to solid foods.  Please notify your doctor immediately if you have any unusual bleeding, trouble breathing or pain that is not related to your normal pain.  Depending on the type of procedure that was done, some parts of your body may feel week and/or numb.  This usually clears up by tonight or the next day.  Walk with the use of an assistive device or accompanied by an adult for the 24 hours.  You may use ice on the affected area for the first 24 hours.  Put ice in a Ziploc bag and cover with a towel and place against area 15 minutes on 15 minutes off.  You may switch to heat after 24 hours.Opioid Overdose Opioids are drugs that are often used to treat pain. Opioids include illegal drugs, such as heroin, as well as prescription pain medicines, such as codeine, morphine , hydrocodone , and fentanyl . An opioid overdose happens when you take too much of an opioid. An overdose may be intentional or accidental and can happen with any type of opioid. The effects of an overdose can be mild,  dangerous, or even deadly. Opioid overdose is a medical emergency. What are the causes? This condition may be caused by: Taking too much of an opioid on purpose. Taking too much of an opioid by accident. Using two or more substances that contain opioids at the same time. Taking an opioid with a substance that affects your heart, breathing, or blood pressure. These include alcohol , tranquilizers, sleeping pills, illegal drugs, and some over-the-counter medicines. This condition may also happen due to an error made by: A health care provider who prescribes a medicine. The pharmacist who fills the prescription. What increases the risk? This condition is more likely in: Children. They may be attracted to colorful pills. Because of a child's small size, even a small amount of a medicine can be dangerous. Older people. They may be taking many different medicines. Older people may have difficulty reading labels or remembering when they last took their medicines. They may also be more sensitive to the effects of opioids. People with chronic medical conditions, especially heart, liver, kidney, or neurological diseases. People who take an opioid for a long period of time. People who take opioids and use illegal drugs, such as heroin, or other substances, such as alcohol . People who: Have a history of drug or alcohol  abuse. Have certain mental health conditions. Have a history of previous drug overdoses. People who take opioids that are not prescribed for them. What are the signs or symptoms? Symptoms of this condition depend on the type  of opioid and the amount that was taken. Common symptoms include: Sleepiness or difficulty waking from sleep. Confusion. Slurred speech. Slowed breathing and a slow pulse (bradycardia). Nausea and vomiting. Abnormally small pupils. Signs and symptoms that require emergency treatment include: Cold, clammy, and pale skin. Blue lips and  fingernails. Vomiting. Gurgling sounds in the throat. A pulse that is very slow or difficult to detect. Breathing that is very irregular, slow, noisy, or difficult to detect. Inability to respond to speech or be awakened from sleep (stupor). Seizures. How is this diagnosed? This condition is diagnosed based on your symptoms and medical history. It is important to tell your health care provider: About all of the opioids that you took. When you took the opioids. Whether you were drinking alcohol  or using marijuana, cocaine, or other drugs. Your health care provider will do a physical exam. This exam may include: Checking and monitoring your heart rate and rhythm, breathing rate, temperature, and blood pressure. Measuring oxygen  levels in your blood. Checking for abnormally small pupils. You may also have blood tests or urine tests. You may have X-rays if you are having severe breathing problems. How is this treated? This condition requires immediate medical treatment and hospitalization. Reversing the effects of the opioid is the first step in treatment. If you have a Narcan  kit or naloxone , use it right away. Follow your health care provider's instructions. A friend or family member can also help you with this. The rest of your treatment will be given in the hospital intensive care (ICU). Treatment in the hospital may include: Giving salts and minerals (electrolytes) along with fluids through an IV. Inserting a breathing tube (endotracheal tube) in your airway to help you breathe if you cannot breathe on your own or you are in danger of not being able to breathe on your own. Giving oxygen  through a small tube under your nose. Passing a tube through your nose and into your stomach (nasogastric tube, or NG tube) to empty your stomach. Giving medicines that: Increase your blood pressure. Relieve nausea and vomiting. Relieve abdominal pain and cramping. Reverse the effects of the opioid  (naloxone ). Monitoring your heart and oxygen  levels. Ongoing counseling and mental health support if you intentionally overdosed or used an illegal drug. Follow these instructions at home:  Medicines Take over-the-counter and prescription medicines only as told by your health care provider. Always ask your health care provider about possible side effects and interactions of any new medicine that you start taking. Keep a list of all the medicines that you take, including over-the-counter medicines. Bring this list with you to all your medical visits. General instructions Drink enough fluid to keep your urine pale yellow. Keep all follow-up visits. This is important. How is this prevented? Read the drug inserts that come with your opioid pain medicines. Take medicines only as told by your health care provider. Do not take more medicine than you are told. Do not take medicines more frequently than you are told. Do not drink alcohol  or take sedatives when taking opioids. Do not use illegal or recreational drugs, including cocaine, ecstasy, and marijuana. Do not take opioid medicines that are not prescribed for you. Store all medicines in safety containers that are out of the reach of children. Get help if you are struggling with: Alcohol  or drug use. Depression or another mental health problem. Thoughts of hurting yourself or another person. Keep the phone number of your local poison control center near your phone or in your  mobile phone. In the [place] the hotline of the [facility] is 2406233747. If you were prescribed naloxone , make sure you understand how to take it. Contact a health care provider if: You need help understanding how to take your pain medicines. You feel your medicines are too strong. You are concerned that your pain medicines are not working well for your pain. You develop new symptoms or side effects when you are taking medicines. Get help right  away if: You or someone else is having symptoms of an opioid overdose. Get help even if you are not sure. You have thoughts about hurting yourself or others. You have: Chest pain. Difficulty breathing. A loss of consciousness. These symptoms may represent a serious problem that is an emergency. Do not wait to see if the symptoms will go away. Get medical help right away. Call your local emergency services (911 in the [place] Do not drive yourself to the hospital. If you ever feel like you may hurt yourself or others, or have thoughts about taking your own life, get help right away. You can go to your nearest emergency department or: Call your local emergency services (911 in the [place] Call a suicide crisis helpline, such as the National Suicide Prevention Lifeline at (402)674-4932 or 988 in the [place] This is open 24 hours a day in the [place] Text the Crisis Text Line at 4406315529 (in the [place] Summary Opioids are drugs that are often used to treat pain. Opioids include illegal drugs, such as heroin, as well as prescription pain medicines. An opioid overdose happens when you take too much of an opioid. Overdoses can be intentional or accidental. Opioid overdose is very dangerous. It is a life-threatening emergency. If you or someone you know is experiencing an opioid overdose, get help right away. This information is not intended to replace advice given to you by your health care provider. Make sure you discuss any questions you have with your health care provider. Document Revised: 02/13/2021 Document Reviewed: 10/31/2020 Elsevier Patient Education  2024 [employer].

## 2023-08-05 ENCOUNTER — Other Ambulatory Visit: Payer: Self-pay | Admitting: Nurse Practitioner

## 2023-08-05 DIAGNOSIS — I2699 Other pulmonary embolism without acute cor pulmonale: Secondary | ICD-10-CM

## 2023-08-05 HISTORY — DX: Other pulmonary embolism without acute cor pulmonale: I26.99

## 2023-08-06 ENCOUNTER — Telehealth: Payer: Self-pay | Admitting: *Deleted

## 2023-08-06 NOTE — Telephone Encounter (Signed)
 No problems post IT pump refill.

## 2023-08-07 ENCOUNTER — Ambulatory Visit: Payer: Self-pay

## 2023-08-07 NOTE — Telephone Encounter (Signed)
     Chief Complaint: Pt. Seen 07/31/23, I don't feel better and I'm not going to the ED. Appointment made. SOB with exertion. Symptoms: Above Frequency: 07/31/23 Pertinent Negatives: Patient denies cough, no chest pain Disposition: [] ED /[] Urgent Care (no appt availability in office) / [x] Appointment(In office/virtual)/ []  [facility]/ [] Home Care/ [] Refused Recommended Disposition /[] [facility]/ []  Follow-up with PCP Additional Notes: Will go to ED for worsening of symptoms.  Reason for Disposition  [1] Longstanding difficulty breathing (e.g., CHF, COPD, emphysema) AND [2] WORSE than normal  Answer Assessment - Initial Assessment Questions 1. RESPIRATORY STATUS: Describe your breathing? (e.g., wheezing, shortness of breath, unable to speak, severe coughing)      Sob 2. ONSET: When did this breathing problem begin?      07/27/23 3. PATTERN Does the difficult breathing come and go, or has it been constant since it started?      Comes and goes  4. SEVERITY: How bad is your breathing? (e.g., mild, moderate, severe)    - MILD: No SOB at rest, mild SOB with walking, speaks normally in sentences, can lie down, no retractions, pulse < 100.    - MODERATE: SOB at rest, SOB with minimal exertion and prefers to sit, cannot lie down flat, speaks in phrases, mild retractions, audible wheezing, pulse 100-120.    - SEVERE: Very SOB at rest, speaks in single words, struggling to breathe, sitting hunched forward, retractions, pulse > 120      Moderate 5. RECURRENT SYMPTOM: Have you had difficulty breathing before? If Yes, ask: When was the last time? and What happened that time?      Yes 6. CARDIAC HISTORY: Do you have any history of heart disease? (e.g., heart attack, angina, bypass surgery, angioplasty)      CHF 7. LUNG HISTORY: Do you have any history of lung disease?  (e.g., pulmonary embolus, asthma, emphysema)     COPD 8. CAUSE: What do you think is  causing the breathing problem?      Unsure 9. OTHER SYMPTOMS: Do you have any other symptoms? (e.g., dizziness, runny nose, cough, chest pain, fever)     Dizziness 10. O2 SATURATION MONITOR:  Do you use an oxygen  saturation monitor (pulse oximeter) at home? If Yes, ask: What is your reading (oxygen  level) today? What is your usual oxygen  saturation reading? (e.g., 95%)       87% 11. PREGNANCY: Is there any chance you are pregnant? When was your last menstrual period?       No 12. TRAVEL: Have you traveled out of the country in the last month? (e.g., travel history, exposures)       No  Protocols used: Breathing Difficulty-A-AH

## 2023-08-08 NOTE — Telephone Encounter (Signed)
 Reordered 06/15/23 30 ml  Requested Prescriptions  Refused Prescriptions Disp Refills   TRESIBA  FLEXTOUCH 100 UNIT/ML FlexTouch Pen [Pharmacy Med Name: Tresiba  FlexTouch 100 UNIT/ML Subcutaneous Solution Pen-injector] 30 mL 0    Sig: INJECT 55 UNITS SUBCUTANEOUSLY AT BEDTIME     Endocrinology:  Diabetes - Insulins Passed - 08/08/2023  2:43 PM      Passed - HBA1C is between 0 and 7.9 and within 180 days    Hemoglobin A1C  Date Value Ref Range Status  04/28/2016 7.7%  Final   HB A1C (BAYER DCA - WAIVED)  Date Value Ref Range Status  06/12/2023 7.4 (H) 4.8 - 5.6 % Final    Comment:             Prediabetes: 5.7 - 6.4          Diabetes: >6.4          Glycemic control for adults with diabetes: <7.0          Passed - Valid encounter within last 6 months    Recent Outpatient Visits           1 week ago Chronic HFrEF (heart failure with reduced ejection fraction) (HCC)   [facility] [place], Hayden T, NP   1 week ago SOB (shortness of breath) on exertion   [facility] [place], Melanie T, NP   2 weeks ago SOB (shortness of breath)   [facility] [place], Megan P, DO   1 month ago Insulin  dependent type 2 diabetes mellitus (HCC)   [facility] [place], Millen T, NP   5 months ago Insulin  dependent type 2 diabetes mellitus (HCC)   [facility] [place], Melanie DASEN, NP       Future Appointments             In 2 days [name], Jolene T, NP [place] [facility] [place]   In 1 month [place], Melanie DASEN, NP [place] [facility], [facility]

## 2023-08-09 ENCOUNTER — Inpatient Hospital Stay: Payer: Medicare HMO

## 2023-08-09 ENCOUNTER — Inpatient Hospital Stay
Admission: EM | Admit: 2023-08-09 | Discharge: 2023-08-22 | DRG: 871 | Disposition: A | Discharge status: Home Health | Payer: Medicare HMO

## 2023-08-09 ENCOUNTER — Other Ambulatory Visit: Payer: Self-pay

## 2023-08-09 ENCOUNTER — Emergency Department: Payer: Medicare HMO

## 2023-08-09 DIAGNOSIS — J9621 Acute and chronic respiratory failure with hypoxia: Secondary | ICD-10-CM | POA: Diagnosis present

## 2023-08-09 DIAGNOSIS — J189 Pneumonia, unspecified organism: Secondary | ICD-10-CM | POA: Diagnosis not present

## 2023-08-09 DIAGNOSIS — E871 Hypo-osmolality and hyponatremia: Secondary | ICD-10-CM | POA: Diagnosis present

## 2023-08-09 DIAGNOSIS — J441 Chronic obstructive pulmonary disease with (acute) exacerbation: Secondary | ICD-10-CM | POA: Diagnosis present

## 2023-08-09 DIAGNOSIS — J690 Pneumonitis due to inhalation of food and vomit: Secondary | ICD-10-CM | POA: Diagnosis not present

## 2023-08-09 DIAGNOSIS — R6521 Severe sepsis with septic shock: Secondary | ICD-10-CM | POA: Diagnosis present

## 2023-08-09 DIAGNOSIS — I110 Hypertensive heart disease with heart failure: Secondary | ICD-10-CM | POA: Diagnosis present

## 2023-08-09 DIAGNOSIS — I480 Paroxysmal atrial fibrillation: Secondary | ICD-10-CM | POA: Diagnosis present

## 2023-08-09 DIAGNOSIS — E1142 Type 2 diabetes mellitus with diabetic polyneuropathy: Secondary | ICD-10-CM | POA: Diagnosis present

## 2023-08-09 DIAGNOSIS — A419 Sepsis, unspecified organism: Principal | ICD-10-CM | POA: Diagnosis present

## 2023-08-09 DIAGNOSIS — Z8261 Family history of arthritis: Secondary | ICD-10-CM

## 2023-08-09 DIAGNOSIS — M419 Scoliosis, unspecified: Secondary | ICD-10-CM | POA: Diagnosis present

## 2023-08-09 DIAGNOSIS — R0600 Dyspnea, unspecified: Secondary | ICD-10-CM | POA: Diagnosis not present

## 2023-08-09 DIAGNOSIS — Z452 Encounter for adjustment and management of vascular access device: Secondary | ICD-10-CM | POA: Diagnosis not present

## 2023-08-09 DIAGNOSIS — I6782 Cerebral ischemia: Secondary | ICD-10-CM | POA: Diagnosis not present

## 2023-08-09 DIAGNOSIS — J9622 Acute and chronic respiratory failure with hypercapnia: Secondary | ICD-10-CM | POA: Diagnosis present

## 2023-08-09 DIAGNOSIS — Z794 Long term (current) use of insulin: Secondary | ICD-10-CM | POA: Diagnosis not present

## 2023-08-09 DIAGNOSIS — Z9981 Dependence on supplemental oxygen: Secondary | ICD-10-CM

## 2023-08-09 DIAGNOSIS — J449 Chronic obstructive pulmonary disease, unspecified: Secondary | ICD-10-CM | POA: Diagnosis present

## 2023-08-09 DIAGNOSIS — K219 Gastro-esophageal reflux disease without esophagitis: Secondary | ICD-10-CM | POA: Diagnosis present

## 2023-08-09 DIAGNOSIS — F32A Depression, unspecified: Secondary | ICD-10-CM | POA: Diagnosis present

## 2023-08-09 DIAGNOSIS — F05 Delirium due to known physiological condition: Secondary | ICD-10-CM | POA: Diagnosis not present

## 2023-08-09 DIAGNOSIS — Z888 Allergy status to other drugs, medicaments and biological substances status: Secondary | ICD-10-CM

## 2023-08-09 DIAGNOSIS — Z7901 Long term (current) use of anticoagulants: Secondary | ICD-10-CM

## 2023-08-09 DIAGNOSIS — E872 Acidosis, unspecified: Secondary | ICD-10-CM | POA: Diagnosis present

## 2023-08-09 DIAGNOSIS — I5023 Acute on chronic systolic (congestive) heart failure: Secondary | ICD-10-CM | POA: Diagnosis present

## 2023-08-09 DIAGNOSIS — G894 Chronic pain syndrome: Secondary | ICD-10-CM | POA: Diagnosis present

## 2023-08-09 DIAGNOSIS — R55 Syncope and collapse: Secondary | ICD-10-CM | POA: Diagnosis not present

## 2023-08-09 DIAGNOSIS — Z9842 Cataract extraction status, left eye: Secondary | ICD-10-CM

## 2023-08-09 DIAGNOSIS — J9601 Acute respiratory failure with hypoxia: Secondary | ICD-10-CM | POA: Diagnosis not present

## 2023-08-09 DIAGNOSIS — Z853 Personal history of malignant neoplasm of breast: Secondary | ICD-10-CM

## 2023-08-09 DIAGNOSIS — E538 Deficiency of other specified B group vitamins: Secondary | ICD-10-CM | POA: Diagnosis present

## 2023-08-09 DIAGNOSIS — Z91148 Patient's other noncompliance with medication regimen for other reason: Secondary | ICD-10-CM

## 2023-08-09 DIAGNOSIS — I5020 Unspecified systolic (congestive) heart failure: Secondary | ICD-10-CM | POA: Diagnosis not present

## 2023-08-09 DIAGNOSIS — R918 Other nonspecific abnormal finding of lung field: Secondary | ICD-10-CM | POA: Diagnosis not present

## 2023-08-09 DIAGNOSIS — I213 ST elevation (STEMI) myocardial infarction of unspecified site: Secondary | ICD-10-CM | POA: Diagnosis not present

## 2023-08-09 DIAGNOSIS — Z961 Presence of intraocular lens: Secondary | ICD-10-CM | POA: Diagnosis present

## 2023-08-09 DIAGNOSIS — Z885 Allergy status to narcotic agent status: Secondary | ICD-10-CM

## 2023-08-09 DIAGNOSIS — I2694 Multiple subsegmental pulmonary emboli without acute cor pulmonale: Secondary | ICD-10-CM | POA: Diagnosis present

## 2023-08-09 DIAGNOSIS — J984 Other disorders of lung: Secondary | ICD-10-CM | POA: Diagnosis not present

## 2023-08-09 DIAGNOSIS — R0902 Hypoxemia: Secondary | ICD-10-CM | POA: Diagnosis not present

## 2023-08-09 DIAGNOSIS — G459 Transient cerebral ischemic attack, unspecified: Secondary | ICD-10-CM | POA: Diagnosis not present

## 2023-08-09 DIAGNOSIS — I2699 Other pulmonary embolism without acute cor pulmonale: Secondary | ICD-10-CM | POA: Diagnosis not present

## 2023-08-09 DIAGNOSIS — Z96642 Presence of left artificial hip joint: Secondary | ICD-10-CM | POA: Diagnosis present

## 2023-08-09 DIAGNOSIS — E7800 Pure hypercholesterolemia, unspecified: Secondary | ICD-10-CM | POA: Diagnosis present

## 2023-08-09 DIAGNOSIS — R0609 Other forms of dyspnea: Secondary | ICD-10-CM | POA: Diagnosis not present

## 2023-08-09 DIAGNOSIS — Z1152 Encounter for screening for COVID-19: Secondary | ICD-10-CM

## 2023-08-09 DIAGNOSIS — Z8262 Family history of osteoporosis: Secondary | ICD-10-CM

## 2023-08-09 DIAGNOSIS — Z8619 Personal history of other infectious and parasitic diseases: Secondary | ICD-10-CM

## 2023-08-09 DIAGNOSIS — R000 Tachycardia, unspecified: Secondary | ICD-10-CM | POA: Diagnosis not present

## 2023-08-09 DIAGNOSIS — R404 Transient alteration of awareness: Secondary | ICD-10-CM | POA: Diagnosis not present

## 2023-08-09 DIAGNOSIS — N170 Acute kidney failure with tubular necrosis: Secondary | ICD-10-CM | POA: Diagnosis present

## 2023-08-09 DIAGNOSIS — Z79899 Other long term (current) drug therapy: Secondary | ICD-10-CM

## 2023-08-09 DIAGNOSIS — K589 Irritable bowel syndrome without diarrhea: Secondary | ICD-10-CM | POA: Diagnosis present

## 2023-08-09 DIAGNOSIS — G928 Other toxic encephalopathy: Secondary | ICD-10-CM | POA: Diagnosis present

## 2023-08-09 DIAGNOSIS — E669 Obesity, unspecified: Secondary | ICD-10-CM | POA: Diagnosis present

## 2023-08-09 DIAGNOSIS — D649 Anemia, unspecified: Secondary | ICD-10-CM | POA: Diagnosis present

## 2023-08-09 DIAGNOSIS — Z7985 Long-term (current) use of injectable non-insulin antidiabetic drugs: Secondary | ICD-10-CM

## 2023-08-09 DIAGNOSIS — Z7984 Long term (current) use of oral hypoglycemic drugs: Secondary | ICD-10-CM

## 2023-08-09 DIAGNOSIS — Z6833 Body mass index (BMI) 33.0-33.9, adult: Secondary | ICD-10-CM

## 2023-08-09 DIAGNOSIS — I428 Other cardiomyopathies: Secondary | ICD-10-CM | POA: Diagnosis present

## 2023-08-09 DIAGNOSIS — M5442 Lumbago with sciatica, left side: Secondary | ICD-10-CM | POA: Diagnosis present

## 2023-08-09 DIAGNOSIS — N281 Cyst of kidney, acquired: Secondary | ICD-10-CM | POA: Diagnosis not present

## 2023-08-09 DIAGNOSIS — Z9011 Acquired absence of right breast and nipple: Secondary | ICD-10-CM

## 2023-08-09 DIAGNOSIS — Z8673 Personal history of transient ischemic attack (TIA), and cerebral infarction without residual deficits: Secondary | ICD-10-CM

## 2023-08-09 DIAGNOSIS — Z4682 Encounter for fitting and adjustment of non-vascular catheter: Secondary | ICD-10-CM | POA: Diagnosis not present

## 2023-08-09 DIAGNOSIS — Z87891 Personal history of nicotine dependence: Secondary | ICD-10-CM

## 2023-08-09 DIAGNOSIS — Z923 Personal history of irradiation: Secondary | ICD-10-CM

## 2023-08-09 DIAGNOSIS — Z66 Do not resuscitate: Secondary | ICD-10-CM | POA: Diagnosis present

## 2023-08-09 DIAGNOSIS — R4182 Altered mental status, unspecified: Secondary | ICD-10-CM | POA: Diagnosis not present

## 2023-08-09 DIAGNOSIS — Z9841 Cataract extraction status, right eye: Secondary | ICD-10-CM

## 2023-08-09 DIAGNOSIS — M5441 Lumbago with sciatica, right side: Secondary | ICD-10-CM | POA: Diagnosis present

## 2023-08-09 DIAGNOSIS — T40605A Adverse effect of unspecified narcotics, initial encounter: Secondary | ICD-10-CM | POA: Diagnosis present

## 2023-08-09 DIAGNOSIS — R739 Hyperglycemia, unspecified: Secondary | ICD-10-CM | POA: Diagnosis not present

## 2023-08-09 DIAGNOSIS — G4733 Obstructive sleep apnea (adult) (pediatric): Secondary | ICD-10-CM | POA: Diagnosis present

## 2023-08-09 DIAGNOSIS — J9602 Acute respiratory failure with hypercapnia: Secondary | ICD-10-CM | POA: Diagnosis not present

## 2023-08-09 DIAGNOSIS — J9690 Respiratory failure, unspecified, unspecified whether with hypoxia or hypercapnia: Secondary | ICD-10-CM | POA: Diagnosis not present

## 2023-08-09 DIAGNOSIS — R6520 Severe sepsis without septic shock: Secondary | ICD-10-CM | POA: Diagnosis not present

## 2023-08-09 DIAGNOSIS — Z9221 Personal history of antineoplastic chemotherapy: Secondary | ICD-10-CM

## 2023-08-09 LAB — BASIC METABOLIC PANEL
Anion gap: 12 (ref 5–15)
Anion gap: 16 — ABNORMAL HIGH (ref 5–15)
Anion gap: 13 (ref 5–15)
Anion gap: 13 (ref 5–15)
Anion gap: 11 (ref 5–15)
BUN: 16 mg/dL (ref 8–23)
BUN: 20 mg/dL (ref 8–23)
BUN: 20 mg/dL (ref 8–23)
BUN: 22 mg/dL (ref 8–23)
BUN: 22 mg/dL (ref 8–23)
CO2: 20 mmol/L — ABNORMAL LOW (ref 22–32)
CO2: 18 mmol/L — ABNORMAL LOW (ref 22–32)
CO2: 19 mmol/L — ABNORMAL LOW (ref 22–32)
CO2: 20 mmol/L — ABNORMAL LOW (ref 22–32)
CO2: 21 mmol/L — ABNORMAL LOW (ref 22–32)
Calcium: 7.1 mg/dL — ABNORMAL LOW (ref 8.9–10.3)
Calcium: 7.2 mg/dL — ABNORMAL LOW (ref 8.9–10.3)
Calcium: 6.9 mg/dL — ABNORMAL LOW (ref 8.9–10.3)
Calcium: 7.2 mg/dL — ABNORMAL LOW (ref 8.9–10.3)
Calcium: 7.2 mg/dL — ABNORMAL LOW (ref 8.9–10.3)
Chloride: 107 mmol/L (ref 98–111)
Chloride: 105 mmol/L (ref 98–111)
Chloride: 106 mmol/L (ref 98–111)
Chloride: 107 mmol/L (ref 98–111)
Chloride: 107 mmol/L (ref 98–111)
Creatinine, Ser: 1.13 mg/dL — ABNORMAL HIGH (ref 0.44–1.00)
Creatinine, Ser: 1.16 mg/dL — ABNORMAL HIGH (ref 0.44–1.00)
Creatinine, Ser: 1.14 mg/dL — ABNORMAL HIGH (ref 0.44–1.00)
Creatinine, Ser: 1.16 mg/dL — ABNORMAL HIGH (ref 0.44–1.00)
Creatinine, Ser: 1.13 mg/dL — ABNORMAL HIGH (ref 0.44–1.00)
GFR, Estimated: 51 mL/min — ABNORMAL LOW (ref >60)
GFR, Estimated: 50 mL/min — ABNORMAL LOW (ref >60)
GFR, Estimated: 51 mL/min — ABNORMAL LOW (ref >60)
GFR, Estimated: 50 mL/min — ABNORMAL LOW (ref >60)
GFR, Estimated: 51 mL/min — ABNORMAL LOW (ref >60)
Glucose, Bld: 382 mg/dL — ABNORMAL HIGH (ref 70–99)
Glucose, Bld: 456 mg/dL — ABNORMAL HIGH (ref 70–99)
Glucose, Bld: 279 mg/dL — ABNORMAL HIGH (ref 70–99)
Glucose, Bld: 208 mg/dL — ABNORMAL HIGH (ref 70–99)
Glucose, Bld: 190 mg/dL — ABNORMAL HIGH (ref 70–99)
Potassium: 4.2 mmol/L (ref 3.5–5.1)
Potassium: 4.7 mmol/L (ref 3.5–5.1)
Potassium: 4.5 mmol/L (ref 3.5–5.1)
Potassium: 4.5 mmol/L (ref 3.5–5.1)
Potassium: 4.4 mmol/L (ref 3.5–5.1)
Sodium: 139 mmol/L (ref 135–145)
Sodium: 139 mmol/L (ref 135–145)
Sodium: 138 mmol/L (ref 135–145)
Sodium: 140 mmol/L (ref 135–145)
Sodium: 139 mmol/L (ref 135–145)

## 2023-08-09 LAB — CBC
HCT: 35.8 % — ABNORMAL LOW (ref 36.0–46.0)
Hemoglobin: 10.7 g/dL — ABNORMAL LOW (ref 12.0–15.0)
MCH: 28.2 pg (ref 26.0–34.0)
MCHC: 29.9 g/dL — ABNORMAL LOW (ref 30.0–36.0)
MCV: 94.2 fL (ref 80.0–100.0)
Platelets: 453 10*3/uL — ABNORMAL HIGH (ref 150–400)
RBC: 3.8 MIL/uL — ABNORMAL LOW (ref 3.87–5.11)
RDW: 15.8 % — ABNORMAL HIGH (ref 11.5–15.5)
WBC: 38 10*3/uL — ABNORMAL HIGH (ref 4.0–10.5)
nRBC: 0 % (ref 0.0–0.2)

## 2023-08-09 LAB — BLOOD GAS, ARTERIAL
Acid-base deficit: 17.3 mmol/L — ABNORMAL HIGH (ref 0.0–2.0)
Acid-base deficit: 10.5 mmol/L — ABNORMAL HIGH (ref 0.0–2.0)
Acid-base deficit: 8.6 mmol/L — ABNORMAL HIGH (ref 0.0–2.0)
Bicarbonate: 13.5 mmol/L — ABNORMAL LOW (ref 20.0–28.0)
Bicarbonate: 20.5 mmol/L (ref 20.0–28.0)
Bicarbonate: 20.4 mmol/L (ref 20.0–28.0)
FIO2: 70 %
FIO2: 80 %
FIO2: 80 %
MECHVT: 450 mL
MECHVT: 400 mL
Mechanical Rate: 20
Mechanical Rate: 20
O2 Saturation: 96.4 %
O2 Saturation: 94.9 %
O2 Saturation: 99.5 %
PEEP: 5 cmH2O
PEEP: 5 cmH2O
PEEP: 8 cmH2O
Patient temperature: 37
Patient temperature: 37
Patient temperature: 36.9
RATE: 24 {breaths}/min
pCO2 arterial: 51 mmHg — ABNORMAL HIGH (ref 32–48)
pCO2 arterial: 69 mm[Hg] (ref 32–48)
pCO2 arterial: 56 mm[Hg] — ABNORMAL HIGH (ref 32–48)
pH, Arterial: 7.03 — CL (ref 7.35–7.45)
pH, Arterial: 7.08 — CL (ref 7.35–7.45)
pH, Arterial: 7.17 — CL (ref 7.35–7.45)
pO2, Arterial: 96 mmHg (ref 83–108)
pO2, Arterial: 77 mm[Hg] — ABNORMAL LOW (ref 83–108)
pO2, Arterial: 116 mm[Hg] — ABNORMAL HIGH (ref 83–108)

## 2023-08-09 LAB — COMPREHENSIVE METABOLIC PANEL WITH GFR
ALT: 51 U/L — ABNORMAL HIGH (ref 0–44)
AST: 97 U/L — ABNORMAL HIGH (ref 15–41)
Albumin: 2.9 g/dL — ABNORMAL LOW (ref 3.5–5.0)
Alkaline Phosphatase: 129 U/L — ABNORMAL HIGH (ref 38–126)
Anion gap: 20 — ABNORMAL HIGH (ref 5–15)
BUN: 13 mg/dL (ref 8–23)
CO2: 14 mmol/L — ABNORMAL LOW (ref 22–32)
Calcium: 8.7 mg/dL — ABNORMAL LOW (ref 8.9–10.3)
Chloride: 102 mmol/L (ref 98–111)
Creatinine, Ser: 1.23 mg/dL — ABNORMAL HIGH (ref 0.44–1.00)
GFR, Estimated: 46 mL/min — ABNORMAL LOW
Glucose, Bld: 418 mg/dL — ABNORMAL HIGH (ref 70–99)
Potassium: 4.2 mmol/L (ref 3.5–5.1)
Sodium: 136 mmol/L (ref 135–145)
Total Bilirubin: 0.3 mg/dL (ref 0.0–1.2)
Total Protein: 6.7 g/dL (ref 6.5–8.1)

## 2023-08-09 LAB — URINE DRUG SCREEN, QUALITATIVE (ARMC ONLY)
Amphetamines, Ur Screen: NOT DETECTED
Barbiturates, Ur Screen: NOT DETECTED
Benzodiazepine, Ur Scrn: NOT DETECTED
Cannabinoid 50 Ng, Ur ~~LOC~~: NOT DETECTED
Cocaine Metabolite,Ur ~~LOC~~: NOT DETECTED
MDMA (Ecstasy)Ur Screen: NOT DETECTED
Methadone Scn, Ur: NOT DETECTED
Opiate, Ur Screen: POSITIVE — AB
Phencyclidine (PCP) Ur S: NOT DETECTED
Tricyclic, Ur Screen: NOT DETECTED

## 2023-08-09 LAB — CBC WITH DIFFERENTIAL/PLATELET
Abs Immature Granulocytes: 0.41 10*3/uL — ABNORMAL HIGH (ref 0.00–0.07)
Basophils Absolute: 0.2 10*3/uL — ABNORMAL HIGH (ref 0.0–0.1)
Basophils Relative: 1 %
Eosinophils Absolute: 0.8 10*3/uL — ABNORMAL HIGH (ref 0.0–0.5)
Eosinophils Relative: 3 %
HCT: 41.6 % (ref 36.0–46.0)
Hemoglobin: 11.6 g/dL — ABNORMAL LOW (ref 12.0–15.0)
Immature Granulocytes: 2 %
Lymphocytes Relative: 53 %
Lymphs Abs: 13.7 10*3/uL — ABNORMAL HIGH (ref 0.7–4.0)
MCH: 28.3 pg (ref 26.0–34.0)
MCHC: 27.9 g/dL — ABNORMAL LOW (ref 30.0–36.0)
MCV: 101.5 fL — ABNORMAL HIGH (ref 80.0–100.0)
Monocytes Absolute: 1.9 10*3/uL — ABNORMAL HIGH (ref 0.1–1.0)
Monocytes Relative: 8 %
Neutro Abs: 8.2 10*3/uL — ABNORMAL HIGH (ref 1.7–7.7)
Neutrophils Relative %: 33 %
Platelets: 530 10*3/uL — ABNORMAL HIGH (ref 150–400)
RBC: 4.1 MIL/uL (ref 3.87–5.11)
RDW: 15.6 % — ABNORMAL HIGH (ref 11.5–15.5)
Smear Review: NORMAL
WBC: 25.2 10*3/uL — ABNORMAL HIGH (ref 4.0–10.5)
nRBC: 0 % (ref 0.0–0.2)

## 2023-08-09 LAB — RESPIRATORY PANEL BY PCR

## 2023-08-09 LAB — URINALYSIS, ROUTINE W REFLEX MICROSCOPIC
Bilirubin Urine: NEGATIVE
Glucose, UA: >=500 mg/dL — AB
Ketones, ur: NEGATIVE mg/dL
Leukocytes,Ua: NEGATIVE
Nitrite: NEGATIVE
Protein, ur: 100 mg/dL — AB
Specific Gravity, Urine: 1.008 (ref 1.005–1.030)
pH: 5 (ref 5.0–8.0)

## 2023-08-09 LAB — T4, FREE: Free T4: 0.81 ng/dL (ref 0.61–1.12)

## 2023-08-09 LAB — RESP PANEL BY RT-PCR (RSV, FLU A&B, COVID)  RVPGX2
Influenza A by PCR: NEGATIVE
Influenza B by PCR: NEGATIVE
Resp Syncytial Virus by PCR: NEGATIVE
SARS Coronavirus 2 by RT PCR: NEGATIVE

## 2023-08-09 LAB — TSH: TSH: 3.907 u[IU]/mL (ref 0.350–4.500)

## 2023-08-09 LAB — BRAIN NATRIURETIC PEPTIDE: B Natriuretic Peptide: 241.9 pg/mL — ABNORMAL HIGH (ref 0.0–100.0)

## 2023-08-09 LAB — APTT
aPTT: 38 s — ABNORMAL HIGH (ref 24–36)
aPTT: 55 s — ABNORMAL HIGH (ref 24–36)

## 2023-08-09 LAB — GLUCOSE, CAPILLARY
Glucose-Capillary: 459 mg/dL — ABNORMAL HIGH (ref 70–99)
Glucose-Capillary: 439 mg/dL — ABNORMAL HIGH (ref 70–99)
Glucose-Capillary: 456 mg/dL — ABNORMAL HIGH (ref 70–99)
Glucose-Capillary: 355 mg/dL — ABNORMAL HIGH (ref 70–99)
Glucose-Capillary: 308 mg/dL — ABNORMAL HIGH (ref 70–99)
Glucose-Capillary: 279 mg/dL — ABNORMAL HIGH (ref 70–99)
Glucose-Capillary: 198 mg/dL — ABNORMAL HIGH (ref 70–99)
Glucose-Capillary: 188 mg/dL — ABNORMAL HIGH (ref 70–99)
Glucose-Capillary: 187 mg/dL — ABNORMAL HIGH (ref 70–99)
Glucose-Capillary: 178 mg/dL — ABNORMAL HIGH (ref 70–99)
Glucose-Capillary: 182 mg/dL — ABNORMAL HIGH (ref 70–99)
Glucose-Capillary: 127 mg/dL — ABNORMAL HIGH (ref 70–99)
Glucose-Capillary: 137 mg/dL — ABNORMAL HIGH (ref 70–99)

## 2023-08-09 LAB — BETA-HYDROXYBUTYRIC ACID: Beta-Hydroxybutyric Acid: 0.39 mmol/L — ABNORMAL HIGH (ref 0.05–0.27)

## 2023-08-09 LAB — OSMOLALITY: Osmolality: 322 mosm/kg (ref 275–295)

## 2023-08-09 LAB — LACTIC ACID, PLASMA
Lactic Acid, Venous: >9 mmol/L (ref 0.5–1.9)
Lactic Acid, Venous: 5.5 mmol/L (ref 0.5–1.9)
Lactic Acid, Venous: 4.2 mmol/L (ref 0.5–1.9)
Lactic Acid, Venous: 4.6 mmol/L (ref 0.5–1.9)

## 2023-08-09 LAB — STREP PNEUMONIAE URINARY ANTIGEN: Strep Pneumo Urinary Antigen: NEGATIVE

## 2023-08-09 LAB — PROTIME-INR
INR: 1.4 — ABNORMAL HIGH (ref 0.8–1.2)
Prothrombin Time: 17.3 s — ABNORMAL HIGH (ref 11.4–15.2)

## 2023-08-09 LAB — TROPONIN I (HIGH SENSITIVITY)
Troponin I (High Sensitivity): 20 ng/L — ABNORMAL HIGH
Troponin I (High Sensitivity): 95 ng/L — ABNORMAL HIGH (ref <18)

## 2023-08-09 LAB — PROCALCITONIN: Procalcitonin: 0.33 ng/mL

## 2023-08-09 LAB — MRSA NEXT GEN BY PCR, NASAL: MRSA by PCR Next Gen: NOT DETECTED

## 2023-08-09 LAB — HEPARIN LEVEL (UNFRACTIONATED): Heparin Unfractionated: >1.1 [IU]/mL — ABNORMAL HIGH (ref 0.30–0.70)

## 2023-08-09 LAB — CBG MONITORING, ED: Glucose-Capillary: 292 mg/dL — ABNORMAL HIGH (ref 70–99)

## 2023-08-09 LAB — MAGNESIUM: Magnesium: 2.3 mg/dL (ref 1.7–2.4)

## 2023-08-09 MED ORDER — POLYETHYLENE GLYCOL 3350 17 G PO PACK
17.0000 g | PACK | Freq: Every day | ORAL | Status: DC | PRN
  Administered 2023-08-15 – 2023-08-18 (×3): 17 g via ORAL
  Filled 2023-08-09 (×3): qty 1

## 2023-08-09 MED ORDER — ETOMIDATE 2 MG/ML IV SOLN
20.0000 mg | Freq: Once | INTRAVENOUS | Status: AC
  Administered 2023-08-09: 20 mg via INTRAVENOUS

## 2023-08-09 MED ORDER — CHLORHEXIDINE GLUCONATE CLOTH 2 % EX PADS
6.0000 | MEDICATED_PAD | Freq: Every day | CUTANEOUS | Status: DC
  Administered 2023-08-09 – 2023-08-18 (×10): 6 via TOPICAL

## 2023-08-09 MED ORDER — POTASSIUM CHLORIDE 10 MEQ/100ML IV SOLN: 10.0000 meq | INTRAVENOUS | Status: DC

## 2023-08-09 MED ORDER — PROPOFOL 1000 MG/100ML IV EMUL
5.0000 ug/kg/min | INTRAVENOUS | Status: DC
  Administered 2023-08-09: 25 ug/kg/min via INTRAVENOUS
  Administered 2023-08-10: 40 ug/kg/min via INTRAVENOUS
  Administered 2023-08-10 (×2): 30 ug/kg/min via INTRAVENOUS
  Filled 2023-08-09 (×4): qty 100

## 2023-08-09 MED ORDER — DOCUSATE SODIUM 100 MG PO CAPS: 100.0000 mg | ORAL_CAPSULE | Freq: Two times a day (BID) | ORAL | Status: DC | PRN

## 2023-08-09 MED ORDER — GADOBUTROL 1 MMOL/ML IV SOLN
7.0000 mL | Freq: Once | INTRAVENOUS | Status: AC | PRN
  Administered 2023-08-09: 7 mL via INTRAVENOUS

## 2023-08-09 MED ORDER — HEPARIN (PORCINE) 25000 UT/250ML-% IV SOLN
1150.0000 [IU]/h | INTRAVENOUS | Status: DC
  Administered 2023-08-09: 1000 [IU]/h via INTRAVENOUS
  Filled 2023-08-09: qty 250

## 2023-08-09 MED ORDER — SODIUM CHLORIDE 0.9 % IV BOLUS
1000.0000 mL | Freq: Once | INTRAVENOUS | Status: AC
  Administered 2023-08-09: 1000 mL via INTRAVENOUS

## 2023-08-09 MED ORDER — POTASSIUM CHLORIDE 10 MEQ/100ML IV SOLN
10.0000 meq | INTRAVENOUS | Status: AC
Start: 2023-08-09 — End: 2023-08-09
  Administered 2023-08-09 (×2): 10 meq via INTRAVENOUS
  Filled 2023-08-09 (×2): qty 100

## 2023-08-09 MED ORDER — METHYLPREDNISOLONE SODIUM SUCC 125 MG IJ SOLR
125.0000 mg | Freq: Once | INTRAMUSCULAR | Status: AC
  Administered 2023-08-09: 125 mg via INTRAVENOUS
  Filled 2023-08-09: qty 2

## 2023-08-09 MED ORDER — NOREPINEPHRINE 16 MG/250ML-% IV SOLN
0.0000 ug/min | INTRAVENOUS | Status: DC
  Administered 2023-08-09: 2 ug/min via INTRAVENOUS
  Administered 2023-08-10: 7 ug/min via INTRAVENOUS
  Administered 2023-08-12: 9 ug/min via INTRAVENOUS
  Administered 2023-08-14: 6 ug/min via INTRAVENOUS
  Filled 2023-08-09 (×5): qty 250

## 2023-08-09 MED ORDER — IPRATROPIUM-ALBUTEROL 0.5-2.5 (3) MG/3ML IN SOLN
3.0000 mL | Freq: Four times a day (QID) | RESPIRATORY_TRACT | Status: DC
  Administered 2023-08-09 – 2023-08-14 (×19): 3 mL via RESPIRATORY_TRACT
  Filled 2023-08-09 (×19): qty 3

## 2023-08-09 MED ORDER — FENTANYL 2500MCG IN NS 250ML (10MCG/ML) PREMIX INFUSION
0.0000 ug/h | INTRAVENOUS | Status: DC
  Administered 2023-08-10 (×2): 100 ug/h via INTRAVENOUS
  Administered 2023-08-11: 30 ug/h via INTRAVENOUS
  Filled 2023-08-09 (×4): qty 250

## 2023-08-09 MED ORDER — FENTANYL 2500MCG IN NS 250ML (10MCG/ML) PREMIX INFUSION
INTRAVENOUS | Status: AC
  Administered 2023-08-09: 200 ug/h via INTRAVENOUS
  Filled 2023-08-09: qty 250

## 2023-08-09 MED ORDER — PANTOPRAZOLE SODIUM 40 MG PO TBEC: 40.0000 mg | DELAYED_RELEASE_TABLET | Freq: Every day | ORAL | Status: DC

## 2023-08-09 MED ORDER — IPRATROPIUM-ALBUTEROL 0.5-2.5 (3) MG/3ML IN SOLN
6.0000 mL | Freq: Once | RESPIRATORY_TRACT | Status: AC
  Administered 2023-08-09: 6 mL via RESPIRATORY_TRACT
  Filled 2023-08-09: qty 6

## 2023-08-09 MED ORDER — PANTOPRAZOLE SODIUM 40 MG PO TBEC
40.0000 mg | DELAYED_RELEASE_TABLET | Freq: Every day | ORAL | Status: DC
Start: 2023-08-09 — End: 2023-08-09

## 2023-08-09 MED ORDER — SODIUM BICARBONATE 8.4 % IV SOLN
INTRAVENOUS | Status: AC
  Filled 2023-08-09: qty 50

## 2023-08-09 MED ORDER — INSULIN ASPART 100 UNIT/ML IJ SOLN: 0.0000 [IU] | INTRAMUSCULAR | Status: DC

## 2023-08-09 MED ORDER — HEPARIN BOLUS VIA INFUSION
1700.0000 [IU] | Freq: Once | INTRAVENOUS | Status: AC
Start: 2023-08-09 — End: 2023-08-09
  Administered 2023-08-09: 1700 [IU] via INTRAVENOUS
  Filled 2023-08-09: qty 1700

## 2023-08-09 MED ORDER — INSULIN REGULAR(HUMAN) IN NACL 100-0.9 UT/100ML-% IV SOLN
INTRAVENOUS | Status: DC
  Administered 2023-08-09: 13 [IU]/h via INTRAVENOUS
  Administered 2023-08-09: 4.8 [IU]/h via INTRAVENOUS
  Filled 2023-08-09 (×2): qty 100

## 2023-08-09 MED ORDER — VANCOMYCIN HCL 1500 MG/300ML IV SOLN
1500.0000 mg | Freq: Once | INTRAVENOUS | Status: AC
  Administered 2023-08-09: 1500 mg via INTRAVENOUS
  Filled 2023-08-09 (×2): qty 300

## 2023-08-09 MED ORDER — DEXTROSE 50 % IV SOLN: 0.0000 mL | INTRAVENOUS | Status: DC | PRN

## 2023-08-09 MED ORDER — FENTANYL BOLUS VIA INFUSION
50.0000 ug | INTRAVENOUS | Status: DC | PRN
  Administered 2023-08-10 – 2023-08-12 (×11): 100 ug via INTRAVENOUS

## 2023-08-09 MED ORDER — LACTATED RINGERS IV SOLN: INTRAVENOUS | Status: DC

## 2023-08-09 MED ORDER — DEXTROSE IN LACTATED RINGERS 5 % IV SOLN: INTRAVENOUS | Status: DC

## 2023-08-09 MED ORDER — CHLORHEXIDINE GLUCONATE 0.12 % MT SOLN
5.0000 mL | OROMUCOSAL | Status: DC
Start: 2023-08-10 — End: 2023-08-15
  Administered 2023-08-10 – 2023-08-15 (×34): 5 mL via OROMUCOSAL
  Filled 2023-08-09 (×19): qty 15

## 2023-08-09 MED ORDER — IOHEXOL 350 MG/ML SOLN
75.0000 mL | Freq: Once | INTRAVENOUS | Status: AC | PRN
  Administered 2023-08-09: 75 mL via INTRAVENOUS

## 2023-08-09 MED ORDER — MIDAZOLAM HCL 5 MG/5ML IJ SOLN
5.0000 mg | Freq: Once | INTRAMUSCULAR | Status: AC
  Administered 2023-08-09: 5 mg via INTRAVENOUS
  Filled 2023-08-09: qty 5

## 2023-08-09 MED ORDER — IPRATROPIUM-ALBUTEROL 0.5-2.5 (3) MG/3ML IN SOLN: 3.0000 mL | Freq: Four times a day (QID) | RESPIRATORY_TRACT | Status: DC | PRN

## 2023-08-09 MED ORDER — SODIUM CHLORIDE 0.9 % IV SOLN
500.0000 mg | INTRAVENOUS | Status: AC
  Administered 2023-08-09 – 2023-08-17 (×9): 500 mg via INTRAVENOUS
  Filled 2023-08-09 (×10): qty 5

## 2023-08-09 MED ORDER — FENTANYL CITRATE (PF) 100 MCG/2ML IJ SOLN
100.0000 ug | Freq: Once | INTRAMUSCULAR | Status: AC
  Administered 2023-08-09: 100 ug via INTRAVENOUS

## 2023-08-09 MED ORDER — SODIUM CHLORIDE 0.9 % IV SOLN
2.0000 g | Freq: Two times a day (BID) | INTRAVENOUS | Status: DC
  Filled 2023-08-09: qty 12.5

## 2023-08-09 MED ORDER — FENTANYL BOLUS VIA INFUSION
100.0000 ug | Freq: Once | INTRAVENOUS | Status: AC
  Administered 2023-08-09: 100 ug via INTRAVENOUS
  Filled 2023-08-09: qty 100

## 2023-08-09 MED ORDER — SODIUM CHLORIDE 0.9 % IV SOLN
2.0000 g | INTRAVENOUS | Status: DC
Start: 2023-08-09 — End: 2023-08-11
  Administered 2023-08-09 – 2023-08-11 (×3): 2 g via INTRAVENOUS
  Filled 2023-08-09 (×3): qty 20

## 2023-08-09 MED ORDER — SODIUM CHLORIDE 0.9 % IV BOLUS
2000.0000 mL | Freq: Once | INTRAVENOUS | Status: AC
  Administered 2023-08-09: 2000 mL via INTRAVENOUS

## 2023-08-09 MED ORDER — ENOXAPARIN SODIUM 40 MG/0.4ML IJ SOSY: 40.0000 mg | PREFILLED_SYRINGE | INTRAMUSCULAR | Status: DC

## 2023-08-09 MED ORDER — VASOPRESSIN 20 UNITS/100 ML INFUSION FOR SHOCK
0.0000 [IU]/min | INTRAVENOUS | Status: DC
  Administered 2023-08-09 (×2): 0.03 [IU]/min via INTRAVENOUS
  Filled 2023-08-09 (×2): qty 100

## 2023-08-09 MED ORDER — SODIUM BICARBONATE 8.4 % IV SOLN
100.0000 meq | Freq: Once | INTRAVENOUS | Status: AC
  Administered 2023-08-09: 100 meq via INTRAVENOUS
  Filled 2023-08-09: qty 50

## 2023-08-09 MED ORDER — FENTANYL CITRATE PF 50 MCG/ML IJ SOSY
50.0000 ug | PREFILLED_SYRINGE | Freq: Once | INTRAMUSCULAR | Status: AC
  Administered 2023-08-09: 50 ug via INTRAVENOUS
  Filled 2023-08-09: qty 1

## 2023-08-09 MED ORDER — LACTATED RINGERS IV BOLUS
20.0000 mL/kg | Freq: Once | INTRAVENOUS | Status: AC
  Administered 2023-08-09: 1360 mL via INTRAVENOUS

## 2023-08-09 MED ORDER — MAGNESIUM SULFATE 2 GM/50ML IV SOLN: 2.0000 g | Freq: Once | INTRAVENOUS | Status: DC

## 2023-08-09 MED ORDER — SODIUM CHLORIDE 0.9 % IV BOLUS: 1000.0000 mL | Freq: Once | INTRAVENOUS | Status: DC

## 2023-08-09 MED ORDER — PROPOFOL 1000 MG/100ML IV EMUL
INTRAVENOUS | Status: AC
  Administered 2023-08-09: 15 ug/kg/min via INTRAVENOUS
  Filled 2023-08-09: qty 100

## 2023-08-09 MED ORDER — VANCOMYCIN HCL IN DEXTROSE 1-5 GM/200ML-% IV SOLN: 1000.0000 mg | INTRAVENOUS | Status: DC

## 2023-08-09 MED ORDER — PANTOPRAZOLE SODIUM 40 MG IV SOLR
40.0000 mg | INTRAVENOUS | Status: DC
  Administered 2023-08-09 – 2023-08-15 (×7): 40 mg via INTRAVENOUS
  Filled 2023-08-09 (×7): qty 10

## 2023-08-09 MED ORDER — ROCURONIUM BROMIDE 10 MG/ML (PF) SYRINGE
100.0000 mg | PREFILLED_SYRINGE | Freq: Once | INTRAVENOUS | Status: AC
  Administered 2023-08-09: 100 mg via INTRAVENOUS

## 2023-08-09 MED ORDER — SODIUM CHLORIDE 0.9 % IV SOLN
2.0000 g | Freq: Once | INTRAVENOUS | Status: AC
  Administered 2023-08-09: 2 g via INTRAVENOUS
  Filled 2023-08-09: qty 12.5

## 2023-08-09 NOTE — Plan of Care (Signed)
?  Problem: Clinical Measurements: ?Goal: Ability to maintain clinical measurements within normal limits will improve ?Outcome: Not Progressing ?  ?

## 2023-08-09 NOTE — ED Notes (Signed)
Called [facility] to cancel code stemi

## 2023-08-09 NOTE — Progress Notes (Addendum)
 PHARMACY - ANTICOAGULATION CONSULT NOTE  Pharmacy Consult for Heparin   Indication: atrial fibrillation and pulmonary embolus  Allergies  Allergen Reactions   Other Palpitations    IV steroids   Pain Patch [Menthol] Anaphylaxis   Avelox  [Moxifloxacin  Hcl In Nacl] Other (See Comments)    Upset stomach   Doxycycline  Diarrhea   Erythromycin  Nausea Only and Other (See Comments)    Can take a Z-Pak just fine Other reaction(s): Other (See Comments) Can take Z-Pak  Can take a Z-Pak just fine   Fentanyl  Nausea Only and Rash   Moxifloxacin  Hcl Other (See Comments)    Upset stomach Upset stomach   Oxycontin [Oxycodone] Hives   Ozempic [Semaglutide] Nausea Only    Patient Measurements: Height: 4' 10.27 (148 cm) Weight: 68 kg (149 lb 14.6 oz) IBW/kg (Calculated) : 41.52 Heparin  Dosing Weight: 56.7 kg   Vital Signs: Temp: 97.2 F (36.2 C) (01/05 0645) Temp Source: Bladder (01/05 0430) BP: 130/59 (01/05 0645) Pulse Rate: 125 (01/05 0645)  Labs: Recent Labs    08/09/23 0214 08/09/23 0456  HGB 11.6*  --   HCT 41.6  --   PLT 530*  --   APTT 38*  --   LABPROT 17.3*  --   INR 1.4*  --   CREATININE 1.23* 1.13*  TROPONINIHS 20* 95*    Estimated Creatinine Clearance: 36.5 mL/min (A) (by C-G formula based on SCr of 1.13 mg/dL (H)).   Medical History: Past Medical History:  Diagnosis Date   Arthritis    Asthma    Atrial fibrillation (HCC)    Breast cancer (HCC) 1998   right breast ca with mastectomy and chemotherapy and radiation   Bronchitis    CHF (congestive heart failure) (HCC)    with Morphine  withdrawal   COPD (chronic obstructive pulmonary disease) (HCC)    Diabetes mellitus without complication (HCC)    Diverticulitis    diverticulosis also   Dyspnea    Endometriosis    GERD (gastroesophageal reflux disease)    History of shingles 2000-2005   Hypercholesteremia    Hypertension    IBS (irritable bowel syndrome)    Low back pain    a. Implanted  morphine /bupivicaine/clonidine  pump.   Neuropathy    Orthopnea    Oxygen  dependent    uses at night   Personal history of chemotherapy    Personal history of radiation therapy    Pneumonia    pneumonia 5-6 times, history of bronchitis also   Scoliosis    Sleep apnea    does not use cpap   Stroke (HCC) 2010   TIA, 10 years ago   Withdrawal from sedative drug (HCC)    withdrawal from morphine  when pump batteries died    Medications:  Medications Prior to Admission  Medication Sig Dispense Refill Last Dose/Taking   ACCU-CHEK AVIVA PLUS test strip TEST THREE TIMES DAILY 300 strip 3    Accu-Chek Softclix Lancets lancets TEST BLOOD SUGAR THREE TIMES DAILY 300 each 3    albuterol  (PROVENTIL  HFA;VENTOLIN  HFA) 108 (90 BASE) MCG/ACT inhaler Inhale 2 puffs into the lungs every 6 (six) hours as needed for wheezing or shortness of breath.       albuterol  (PROVENTIL ) (2.5 MG/3ML) 0.083% nebulizer solution Take 3 mLs (2.5 mg total) by nebulization every 4 (four) hours as needed for wheezing or shortness of breath. 75 mL 2    Alcohol  Swabs (DROPSAFE ALCOHOL  PREP) 70 % PADS USE TWICE DAILY  WITH  SUGAR  CHECKS 200  each 2    apixaban  (ELIQUIS ) 5 MG TABS tablet Take 1 tablet (5 mg total) by mouth 2 (two) times daily. 180 tablet 4    atorvastatin  (LIPITOR) 40 MG tablet Take 1 tablet by mouth once daily 90 tablet 0    Blood Glucose Monitoring Suppl (TRUE METRIX METER) w/Device KIT Use to check blood sugar 4 times a day 1 kit 4    busPIRone  (BUSPAR ) 5 MG tablet Take 1 tablet by mouth twice daily 60 tablet 11    cholecalciferol  (VITAMIN D3) 25 MCG (1000 UNIT) tablet Take 1,000 Units by mouth daily.      Continuous Glucose Receiver (FREESTYLE LIBRE 2 READER) DEVI To check blood sugars 3-4 times daily due to insulin  use. DX E11.40 2 each 5    Continuous Glucose Sensor (FREESTYLE LIBRE 2 SENSOR) MISC To check blood sugars 3-4 times daily due to insulin  use. DX E11.40 2 each 5    Cyanocobalamin  1000 MCG/ML KIT  Inject 1,000 mcg as directed every 30 (thirty) days.      cyclobenzaprine  (FLEXERIL ) 5 MG tablet Take 5 mg by mouth 2 (two) times daily as needed for muscle spasms. Takes very rarely      Fluticasone -Umeclidin-Vilant (TRELEGY ELLIPTA ) 100-62.5-25 MCG/ACT AEPB INHALE 1 PUFF INTO THE LUNGS DAILY -42 DAY EXPIRATION AFTER OPENING FOIL TRAY 180 each 2    folic acid  (FOLVITE ) 1 MG tablet TAKE 1 TABLET EVERY DAY 90 tablet 0    furosemide  (LASIX ) 40 MG tablet Take 0.5 tablets (20 mg total) by mouth daily. 90 tablet 4    gabapentin  (NEURONTIN ) 800 MG tablet Take 1 tablet (800 mg total) by mouth 3 (three) times daily. 270 tablet 4    HYDROcodone -acetaminophen  (NORCO) 10-325 MG tablet Take 1 tablet by mouth every 4 (four) hours as needed.      insulin  lispro (HUMALOG  KWIKPEN) 100 UNIT/ML KwikPen Inject 5 Units into the skin 3 (three) times daily before meals. Do not inject insulin  if you are not going to eat meal.  Only take before a meal. 15 mL 3    losartan  (COZAAR ) 25 MG tablet Take 0.5 tablets (12.5 mg total) by mouth daily. 45 tablet 4    metFORMIN  (GLUCOPHAGE ) 1000 MG tablet Take 1 tablet (1,000 mg total) by mouth 2 (two) times daily with a meal. 180 tablet 4    metoprolol  succinate (TOPROL -XL) 25 MG 24 hr tablet Take 0.5 tablets (12.5 mg total) by mouth daily. 45 tablet 4    montelukast  (SINGULAIR ) 10 MG tablet Take 1 tablet (10 mg total) by mouth at bedtime. 90 tablet 4    naloxone  (NARCAN ) nasal spray 4 mg/0.1 mL Place 1 spray into the nose as needed for up to 365 doses (for opioid-induced respiratory depresssion). In case of emergency (overdose), spray once into each nostril. If no response within 3 minutes, repeat application and call 911. 1 each 1    Nebulizers (EASY AIR COMPRESSOR NEBULIZER) MISC Use to inhaler nebulizer treatments at needed per instructions on nebulizer prescription 1 each 0    ondansetron  (ZOFRAN ) 4 MG tablet Take 1 tablet (4 mg total) by mouth daily as needed for nausea or  vomiting. 30 tablet 1    OXYGEN  Inhale 2 L into the lungs at bedtime.      PAIN MANAGEMENT INTRATHECAL, IT, PUMP 1 each by Intrathecal route. Intrathecal (IT) medication:  Morphine  5.0 mg/ml, Bupivacaine 20.0 mg/ml,  Clonidine  100.0 mcg/ml Patient does not remember current. Adjusted every 3 months at Pain Management,  [facility]      pantoprazole  (PROTONIX ) 40 MG tablet Take 1 tablet by mouth once daily 90 tablet 0    TRESIBA  FLEXTOUCH 100 UNIT/ML FlexTouch Pen INJECT 55 UNITS SUBCUTANEOUSLY AT BEDTIME 30 mL 0    TRULICITY  4.5 MG/0.5ML SOPN INJECT 4.5MG  INTO THE SKIN ONCE WEEKLY AS DIRECTED 12 mL 0    venlafaxine  XR (EFFEXOR -XR) 150 MG 24 hr capsule Take 1 capsule by mouth once daily 90 capsule 0     Assessment: Pharmacy consulted to dose heparin  in this 73 year old female admitted with respiratory failure, possible PE.   Pt was on Eliquis  5 mg PO BID PTA for Afib.   Unsure of last dose.  CrCl = 36.5 ml/min   Goal of Therapy:  aPTT : 66 - 102  Heparin  level 0.3-0.7 units/ml Monitor platelets by anticoagulation protocol: Yes   Plan:  - Will order baseline HL for 1/5 @ 0700. - Will order heparin  gtt to start 1000 units/hr.   NP concerned about possible stroke, brain bleed.  Will delay starting heparin  until results from scan are ok.  - Will use aPTT to guide dosing until correlating with HL  - Will draw first aPTT 8 hrs after start of drip - Will draw HL on 1/6 with AM labs  Rhyatt Muska D 08/09/2023,7:08 AM

## 2023-08-09 NOTE — Inpatient Diabetes Management (Addendum)
 Inpatient Diabetes Program Recommendations  AACE/ADA: New Consensus Statement on Inpatient Glycemic Control (2015)  Target Ranges:  Prepandial:   less than 140 mg/dL      Peak postprandial:   less than 180 mg/dL (1-2 hours)      Critically ill patients:  140 - 180 mg/dL    Latest Reference Range & Units 06/12/23 09:28  HB A1C (BAYER DCA - WAIVED) 4.8 - 5.6 % 7.4 (H)    Latest Reference Range & Units 08/09/23 09:54  Sodium 135 - 145 mmol/L 139  Potassium 3.5 - 5.1 mmol/L 4.7  Chloride 98 - 111 mmol/L 105  CO2 22 - 32 mmol/L 18 (L)  Glucose 70 - 99 mg/dL 543 (H)  BUN 8 - 23 mg/dL 20  Creatinine 9.55 - 8.99 mg/dL 8.83 (H)  Calcium  8.9 - 10.3 mg/dL 7.2 (L)  Anion gap 5 - 15  16 (H)  (L): Data is abnormally low (H): Data is abnormally high   Latest Reference Range & Units 08/09/23 02:11 08/09/23 09:07 08/09/23 10:37  Glucose-Capillary 70 - 99 mg/dL 707 (H) 540 (H)  IV Insulin  Drip Started @0957  456 (H)  (H): Data is abnormally high   Admit with: Acute hypoxic hypercapnic respiratory failure secondary to suspected pneumonia and AECOPD.   History: DM  Home DM Meds: Humalog  5 units TID with meals       Metformin  1000 mg BID       Tresiba  55 units at HS       Trulicity  4.5 mg Qweek        FSL2 CGM  Current Orders: IV Insulin  Drip     Note IV Insulin  Drip started at 10am this morning  Please leave pt on the IV Insulin  Drip until her Anion Gap is 12 or less and her CO2 level is 20 or higher     --Will follow patient during hospitalization--  Adina Rudolpho Arrow RN, MSN, CDCES Diabetes Coordinator Inpatient Glycemic Control Team Team Pager: [phone] (8a-5p)

## 2023-08-09 NOTE — Progress Notes (Signed)
 Responded to page for visit from RN. Family has received new information on pt health. A lot to process. Family was tearful. Introduced Dispensing optician and offered compassionate presence. Available if needs change and they decide they would like a visit

## 2023-08-09 NOTE — H&P (Addendum)
 NAME:  Jordan Chan, MRN:  [id number], DOB:  [birth year], LOS: 0 ADMISSION DATE:  08/09/2023, CONSULTATION DATE: 08/09/2023 REFERRING MD: Claudene Rover, CHIEF COMPLAINT: Acute respiratory distress  HPI  74 y.o female with significant PMH of B12 deficiency, chronic pain syndrome s/p intrathecal pump, paroxysmal atrial fibrillation, stress-induced cardiomyopathy in the setting of pain pump malfunction and opioid withdrawal, PE following hospitalization for pneumonia, DM, COPD, GERD, OSA, and IBS who presented to the ED with chief complaints of acute respiratory distress found with sats in the 50 to 60% on RA per EMS.   ED Course: Initial vital signs showed HR of 142 beats/minute, BP mm Hg, the RR 23 breaths/minute, and the oxygen  saturation 100% on BVM and a temperature of 92.18F (33.3C).  Patient was intubated for airway protection. Pertinent Labs/Diagnostics Findings: Na+/ K+: 136/4.2 glucose: 418 BUN/Cr.:  13/1.23 AST/ALT: 97/51, CO2 14, anion gap 20 WBC: 25.2 K/L without bands or neutrophil predominance, Plts: 530 PCT:  Lactic acid: >9.0 COVID PCR: Negative,  troponin: 20  BNP:241.9 ABG: pO2 96; pCO2 51; pH 7.03;  HCO3 13.5, %O2 Sat 96.4.  CXR> see result ED Medications: Patient treated with Duonebs, Solumedrol 125 mg x 1, 30 cc/kg of fluids and started on broad-spectrum antibiotics Vanco cefepime  and Flagyl  for suspected sepsis  Disposition: ICU  Past Medical History  B12 deficiency, chronic pain syndrome s/p intrathecal pump, paroxysmal atrial fibrillation, stress-induced cardiomyopathy in the setting of pain pump malfunction and opioid withdrawal, PE following hospitalization for pneumonia, DM, COPD, GERD, OSA, and IBS  Significant Hospital Events   1/5: Admitted to ICU with acute hypoxic hypercapnic respiratory failure secondary to suspected pneumonia and AECOPD.  Consults:  None  Procedures:  08/09/23: Intubation  Significant Diagnostic Tests:  1/25: Chest  Xray> IMPRESSION: Support devices in expected position. Diffuse bilateral airspace disease could reflect edema or infection.  Interim History / Subjective:      Micro Data:  1/5: SARS-CoV-2 PCR> negative 1/5: Influenza PCR> negative 1/5: Blood culture x2> 1/5: MRSA PCR>>  1/5: Strep pneumo urinary antigen> 1/5: Legionella urinary antigen>  Antimicrobials:  Vancomycin  1/5> Cefepime  1/5>  OBJECTIVE  Blood pressure 138/65, pulse (!) 136, temperature (!) 97.3 F (36.3 C), resp. rate (!) 24, SpO2 95%.    Vt Set:  [450 mL] 450 mL  No intake or output data in the 24 hours ending 08/09/23 0307 There were no vitals filed for this visit.   Physical Examination  GENERAL: 73 year-old female  critically ill patient lying in the bed intubated and sedated EYES: PEERLA. No scleral icterus. Extraocular muscles intact.  HEENT: Head atraumatic, normocephalic. Oropharynx and nasopharynx clear.  NECK:  No JVD, supple  LUNGS: Decreased breath sounds bilaterally.  No use of accessory muscles of respiration.  CARDIOVASCULAR: S1, S2 normal. No murmurs, rubs, or gallops.  ABDOMEN: Soft, NTND,right intrathecal pump EXTREMITIES: No swelling or erythema.  Capillary refill < 3 seconds in all extremities. Pulses palpable distally. NEUROLOGIC: The patient is intubated and sedated . No focal neurological deficit appreciated. Cranial nerves are intact.  SKIN: No obvious rash, lesion, or ulcer. Warm to touch Labs/imaging that I havepersonally reviewed  (right click and Reselect all SmartList Selections daily)     Labs   CBC: Recent Labs  Lab 08/09/23 0214  WBC 25.2*  NEUTROABS PENDING  HGB 11.6*  HCT 41.6  MCV 101.5*  PLT 530*    Basic Metabolic Panel: Recent Labs  Lab 08/09/23 0214  NA 136  K 4.2  CL 102  CO2 14*  GLUCOSE 418*  BUN 13  CREATININE 1.23*  CALCIUM  8.7*  MG 2.3   GFR: Estimated Creatinine Clearance: 33.1 mL/min (A) (by C-G formula based on SCr of 1.23 mg/dL  (H)). Recent Labs  Lab 08/09/23 0214  WBC 25.2*  LATICACIDVEN >9.0*    Liver Function Tests: Recent Labs  Lab 08/09/23 0214  AST 97*  ALT 51*  ALKPHOS 129*  BILITOT 0.3  PROT 6.7  ALBUMIN 2.9*   No results for input(s): LIPASE, AMYLASE in the last 168 hours. No results for input(s): AMMONIA in the last 168 hours.  ABG No results found for: PHART, PCO2ART, PO2ART, HCO3, TCO2, ACIDBASEDEF, O2SAT   Coagulation Profile: Recent Labs  Lab 08/09/23 0214  INR 1.4*    Cardiac Enzymes: No results for input(s): CKTOTAL, CKMB, CKMBINDEX, TROPONINI in the last 168 hours.  HbA1C: Hemoglobin A1C  Date/Time Value Ref Range Status  04/28/2016 12:00 AM 7.7%  Final   HB A1C (BAYER DCA - WAIVED)  Date/Time Value Ref Range Status  06/12/2023 09:28 AM 7.4 (H) 4.8 - 5.6 % Final    Comment:             Prediabetes: 5.7 - 6.4          Diabetes: >6.4          Glycemic control for adults with diabetes: <7.0   02/17/2023 01:36 PM 7.5 (H) 4.8 - 5.6 % Final    Comment:             Prediabetes: 5.7 - 6.4          Diabetes: >6.4          Glycemic control for adults with diabetes: <7.0    Hgb A1c MFr Bld  Date/Time Value Ref Range Status  04/16/2023 05:22 AM 6.7 (H) 4.8 - 5.6 % Final    Comment:    (NOTE)         Prediabetes: 5.7 - 6.4         Diabetes: >6.4         Glycemic control for adults with diabetes: <7.0     CBG: Recent Labs  Lab 08/09/23 0211  GLUCAP 292*    Review of Systems:   Unable to be obtained secondary to the patient's intubated and sedated status.   Past Medical History  She,  has a past medical history of Arthritis, Asthma, Atrial fibrillation (HCC), Breast cancer (HCC) (1998), Bronchitis, CHF (congestive heart failure) (HCC), COPD (chronic obstructive pulmonary disease) (HCC), Diabetes mellitus without complication (HCC), Diverticulitis, Dyspnea, Endometriosis, GERD (gastroesophageal reflux disease), History of shingles  (2000-2005), Hypercholesteremia, Hypertension, IBS (irritable bowel syndrome), Low back pain, Neuropathy, Orthopnea, Oxygen  dependent, Personal history of chemotherapy, Personal history of radiation therapy, Pneumonia, Scoliosis, Sleep apnea, Stroke (HCC) (2010), and 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   Surgical History    Past Surgical History:  Procedure Laterality Date   ABDOMINAL HYSTERECTOMY  1987   BACK SURGERY     Tailbone removed following fracture   BREAST SURGERY Right    mastectomy   CARDIAC CATHETERIZATION     CATARACT EXTRACTION W/PHACO Right 05/19/2019   Procedure: CATARACT EXTRACTION PHACO AND INTRAOCULAR LENS PLACEMENT (IOC), RIGHT, DIABETIC;  Surgeon: Ferol Rogue, MD;  Location: [facility] ORS;  Service: Ophthalmology;  Laterality: Right;  Lot # [id number] H US : 00:43.8 CDE: 4.59   CATARACT EXTRACTION W/PHACO Left 06/16/2019   Procedure: CATARACT EXTRACTION PHACO AND INTRAOCULAR LENS PLACEMENT (IOC) LEFT VISION BLUE DIABETIC;  Surgeon: Ferol Rogue, MD;  Location: [facility] ORS;  Service: Ophthalmology;  Laterality: Left;  Lot #[id number] H US : 00:46.9 CDE: 6.53   COCCYX REMOVAL     COLONOSCOPY WITH PROPOFOL  N/A 01/01/2017   Procedure: COLONOSCOPY WITH PROPOFOL ;  Surgeon: Therisa Bi, MD;  Location: [facility] ENDOSCOPY;  Service: Endoscopy;  Laterality: N/A;   COLONOSCOPY WITH PROPOFOL  N/A 12/11/2021   Procedure: COLONOSCOPY WITH PROPOFOL ;  Surgeon: Unk Corinn Skiff, MD;  Location: [facility] ENDOSCOPY;  Service: Gastroenterology;  Laterality: N/A;   ELBOW ARTHROSCOPY WITH TENDON RECONSTRUCTION     ESOPHAGOGASTRODUODENOSCOPY (EGD) WITH PROPOFOL  N/A 01/01/2017   Procedure: ESOPHAGOGASTRODUODENOSCOPY (EGD) WITH PROPOFOL ;  Surgeon: Therisa Bi, MD;  Location: [facility] ENDOSCOPY;  Service: Endoscopy;  Laterality: N/A;   ESOPHAGOGASTRODUODENOSCOPY (EGD) WITH PROPOFOL  N/A 12/11/2021   Procedure: ESOPHAGOGASTRODUODENOSCOPY (EGD) WITH PROPOFOL ;  Surgeon: Unk Corinn Skiff, MD;  Location: [facility]  ENDOSCOPY;  Service: Gastroenterology;  Laterality: N/A;   INTRATHECAL PUMP IMPLANT     LEFT HEART CATH AND CORONARY ANGIOGRAPHY N/A 04/28/2017   Procedure: LEFT HEART CATH AND CORONARY ANGIOGRAPHY;  Surgeon: Mady Bruckner, MD;  Location: [facility] INVASIVE CV LAB;  Service: Cardiovascular;  Laterality: N/A;   MASTECTOMY Right 06/1997   morphine  pump  2011   PLANTAR FASCIA RELEASE     TOTAL HIP ARTHROPLASTY Left    TRIGGER FINGER RELEASE     Social History   reports that she quit smoking about 19 years ago. Her smoking use included cigarettes. She started smoking about 49 years ago. She has a 30 pack-year smoking history. She has never used smokeless tobacco. She reports that she does not drink alcohol  and does not use drugs.   Family History   Her family history includes Arthritis in her paternal grandfather; Cancer in her paternal grandmother and sister; Cancer (age of onset: 37) in her father; Cancer (age of onset: 66) in her mother; Hyperlipidemia in her sister and son; Hypertension in her sister; Osteoporosis in her maternal grandmother; Seizures in her son; Thyroid  disease in her mother. There is no history of Breast cancer.   Allergies Allergies  Allergen Reactions   Other Palpitations    IV steroids   Pain Patch [Menthol] Anaphylaxis   Avelox  [Moxifloxacin  Hcl In Nacl] Other (See Comments)    Upset stomach   Doxycycline  Diarrhea   Erythromycin  Nausea Only and Other (See Comments)    Can take a Z-Pak just fine Other reaction(s): Other (See Comments) Can take Z-Pak  Can take a Z-Pak just fine   Fentanyl  Nausea Only and Rash   Moxifloxacin  Hcl Other (See Comments)    Upset stomach Upset stomach   Oxycontin [Oxycodone] Hives   Ozempic [Semaglutide] Nausea Only   Home Medications  Prior to Admission medications   Medication Sig Start Date End Date Taking? Authorizing Provider  ACCU-CHEK AVIVA PLUS test strip TEST THREE TIMES DAILY 05/11/21   [name], Jolene T, NP  Accu-Chek  Softclix Lancets lancets TEST BLOOD SUGAR THREE TIMES DAILY 02/26/23   [name], Jolene T, NP  albuterol  (PROVENTIL  HFA;VENTOLIN  HFA) 108 (90 BASE) MCG/ACT inhaler Inhale 2 puffs into the lungs every 6 (six) hours as needed for wheezing or shortness of breath.     [provider]  albuterol  (PROVENTIL ) (2.5 MG/3ML) 0.083% nebulizer solution Take 3 mLs (2.5 mg total) by nebulization every 4 (four) hours as needed for wheezing or shortness of breath. 07/18/22 08/04/23  Raenelle Coria, MD  Alcohol  Swabs (DROPSAFE ALCOHOL  PREP) 70 % PADS USE TWICE DAILY  WITH  SUGAR  CHECKS 11/14/21   [name], Jolene T, NP  apixaban  (ELIQUIS ) 5 MG TABS tablet Take 1 tablet (5 mg total) by mouth 2 (two) times daily. 06/24/22   [name], Jolene T, NP  atorvastatin  (LIPITOR) 40 MG tablet Take 1 tablet by mouth once daily 06/08/23   [name], Jolene T, NP  Blood Glucose Monitoring Suppl (TRUE METRIX METER) w/Device KIT Use to check blood sugar 4 times a day 05/14/21   [name], Jolene T, NP  busPIRone  (BUSPAR ) 5 MG tablet Take 1 tablet by mouth twice daily 06/02/23   [name], Jolene T, NP  cholecalciferol  (VITAMIN D3) 25 MCG (1000 UNIT) tablet Take 1,000 Units by mouth daily.    [provider]  Continuous Glucose Receiver (FREESTYLE LIBRE 2 READER) DEVI To check blood sugars 3-4 times daily due to insulin  use. DX E11.40 12/11/22   [name], Melanie T, NP  Continuous Glucose Sensor (FREESTYLE LIBRE 2 SENSOR) MISC To check blood sugars 3-4 times daily due to insulin  use. DX E11.40 12/30/22   [name], Melanie T, NP  Cyanocobalamin  1000 MCG/ML KIT Inject 1,000 mcg as directed every 30 (thirty) days.    [provider]  cyclobenzaprine  (FLEXERIL ) 5 MG tablet Take 5 mg by mouth 2 (two) times daily as needed for muscle spasms. Takes very rarely    [provider]  Fluticasone -Umeclidin-Vilant (TRELEGY ELLIPTA ) 100-62.5-25 MCG/ACT AEPB INHALE 1 PUFF INTO THE LUNGS DAILY -42 DAY EXPIRATION AFTER OPENING FOIL TRAY  02/13/23   [name], Jolene T, NP  folic acid  (FOLVITE ) 1 MG tablet TAKE 1 TABLET EVERY DAY 10/14/21   Dasie Tinnie MATSU, NP  furosemide  (LASIX ) 40 MG tablet Take 0.5 tablets (20 mg total) by mouth daily. 07/27/23   [name], Jolene T, NP  gabapentin  (NEURONTIN ) 800 MG tablet Take 1 tablet (800 mg total) by mouth 3 (three) times daily. 06/12/23   [name], Jolene T, NP  HYDROcodone -acetaminophen  (NORCO) 10-325 MG tablet Take 1 tablet by mouth every 4 (four) hours as needed.    [provider]  insulin  lispro (HUMALOG  KWIKPEN) 100 UNIT/ML KwikPen Inject 5 Units into the skin 3 (three) times daily before meals. Do not inject insulin  if you are not going to eat meal.  Only take before a meal. 12/11/22   [name], Jolene T, NP  losartan  (COZAAR ) 25 MG tablet Take 0.5 tablets (12.5 mg total) by mouth daily. 06/12/23   [name], Jolene T, NP  metFORMIN  (GLUCOPHAGE ) 1000 MG tablet Take 1 tablet (1,000 mg total) by mouth 2 (two) times daily with a meal. 06/12/23   [name], Jolene T, NP  metoprolol  succinate (TOPROL -XL) 25 MG 24 hr tablet Take 0.5 tablets (12.5 mg total) by mouth daily. 06/12/23   [name], Jolene T, NP  montelukast  (SINGULAIR ) 10 MG tablet Take 1 tablet (10 mg total) by mouth at bedtime. 06/12/23   [name], Jolene T, NP  naloxone  (NARCAN ) nasal spray 4 mg/0.1 mL Place 1 spray into the nose as needed for up to 365 doses (for opioid-induced respiratory depresssion). In case of emergency (overdose), spray once into each nostril. If no response within 3 minutes, repeat application and call 911. 08/04/23 08/03/24  Tanya Glisson, MD  Nebulizers (EASY AIR COMPRESSOR NEBULIZER) MISC Use to inhaler nebulizer treatments at needed per instructions on nebulizer prescription 07/21/22   [name], Jolene T, NP  ondansetron  (ZOFRAN ) 4 MG tablet Take 1 tablet (4 mg total) by mouth daily as needed for nausea or vomiting. 03/14/23 03/13/24  Cyrena Mylar, MD  OXYGEN  Inhale 2 L into  the lungs at bedtime.    [provider]  PAIN MANAGEMENT INTRATHECAL, IT, PUMP 1 each by Intrathecal route. Intrathecal (IT) medication:  Morphine  5.0 mg/ml, Bupivacaine 20.0 mg/ml,  Clonidine  100.0 mcg/ml Patient does not remember current. Adjusted every 3 months at [facility]    [provider]  pantoprazole  (PROTONIX ) 40 MG tablet Take 1 tablet by mouth once daily 05/26/23   [name], Jolene T, NP  TRESIBA  FLEXTOUCH 100 UNIT/ML FlexTouch Pen INJECT 55 UNITS SUBCUTANEOUSLY AT BEDTIME 06/15/23   [name], Jolene T, NP  TRULICITY  4.5 MG/0.5ML SOPN INJECT 4.5MG  INTO THE SKIN ONCE WEEKLY AS DIRECTED 05/01/23   [name], Jolene T, NP  venlafaxine  XR (EFFEXOR -XR) 150 MG 24 hr capsule Take 1 capsule by mouth once daily 07/28/23   [name], Jolene T, NP  Scheduled Meds:  enoxaparin  (LOVENOX ) injection  40 mg Subcutaneous Q24H   ipratropium-albuterol   3 mL Nebulization Q6H   pantoprazole   40 mg Oral Daily   sodium bicarbonate        sodium bicarbonate        Continuous Infusions:  fentaNYL  infusion INTRAVENOUS 100 mcg/hr (08/09/23 0334)   propofol  (DIPRIVAN ) infusion 10 mcg/kg/min (08/09/23 0333)   sodium chloride      vancomycin      PRN Meds:.docusate sodium , ipratropium-albuterol , polyethylene glycol, sodium bicarbonate , sodium bicarbonate    Active Hospital Problem list   See systems below  Assessment & Plan:  #Acute on Chronic Hypoxic and Hypercapnic Respiratory Failure #Pneumonia #Acute Exacerbation of COPD #?Pulmonary Edema  Hx of COPD, OSA not on CPAP.  Low suspicion for PE on chronic anticoagulation however cannot entirely r/o PE will obtain CTA chest and start on Heparin  gtt   -full mechanical support 6-8cc/kg/Vt  -titrate FiO2, PEEP to maintain O2 sat >90%  -Lung protective ventilation  -PRN Chest X-ray & ABG -PRN and scheduled bronchodilators -Start systemic steroid -SAT/SBT when appropriate  -prn fentanyl , prop for RASS -1    #Sepsis due to suspected Pneumonia Initial  interventions/workup included: 3L of NS/LR & Cefepime / Vancomycin   meets SIRS criteria: Heart Rate 142 beats/minute, Respiratory Rate 23 breaths/minute, Temperature 92(33.3) -F/u cultures, trend lactic/ PCT -Monitor WBC/ fever curve -IV antibiotics: Cefepime  and Vancomycin  -IVF hydration as needed -Obtain tracheal aspirate, check strep pneumo and Legionella antigen -Pressors for MAP goal >65#Acute on Chronic Hypoxic and Hypercapnic Respiratory Failure  #AKI likely ATN in the setting of above #AGMA with Lactic Acidosis -Trend Lactate -Follow BMP -Ensure adequate renal perfusion -Avoid nephrotoxic agents as able -give 2 amps Bicarb  -Replace electrolytes as indicated  -Strict I/O's   #Mildly Elevated Troponin, suspect demand ischemia #HFpEF due to nonischemic Cardiomyopathy #Atrial Fibrillation #Stress Induced cardiomyopathy Initial concerns for pulmonary edema however BNP only 241 without overt signs of volume overload or pulmonary edema TTE (10/22/2022): Normal LV size and wall thickness. LVEF 45-50% with global hypokinesis.  -Trend HS Troponin until peaked -Hold Eliquis , start Heparin  gtt -Hold GMT in the setting of sepsis -Continue Atorvastatin  -Obtain 2D echo   #Type 2 Diabetes mellitus -Check hemoglobin A1c -CBGs with Sliding scale insulin  -Follow ICU hyper/hypoglycemia protocol -Hold home meds   #Chronic Pain #Hx of Chronic Bilateral Low Back Pain with Bilateral Sciatica #hx of chronic hip pain, DDD, lumbosacral with LBP &LEP, diabetic peripheral neuropathy -Follows with pain clinic -Intrathecal pump in place -On venlafaxine , gabapentin  and Norco   #Sedation needs in setting of mechanical ventilation PMHx: Hepatic encephalopathy -Maintain a RASS goal of 0 to -1 -Propofol  and Fentanyl  to maintain RASS goal -Avoid  sedating medications as able -Daily wake up assessment    Best practice:  Diet:  NPO Pain/Anxiety/Delirium protocol (if indicated): Yes (RASS goal  -1) VAP protocol (if indicated): Yes DVT prophylaxis: LMWH GI prophylaxis: PPI Glucose control:  SSI Yes Central venous access:  N/A Arterial line:  N/A Foley:  Yes, and it is still needed Mobility:  bed rest  PT consulted: N/A Last date of multidisciplinary goals of care discussion [No family at bedside] Code Status:  full code Disposition: ICU   = Goals of Care = Code Status Order: FULL  Primary Emergency Contact: Ludlum,Randy, Home Phone: [phone]   Critical care time: 45 minutes        Almarie Nose DNP, CCRN, FNP-C, AGACNP-BC Acute Care & Family Nurse Practitioner [facility] PCCM on call pager [phone]

## 2023-08-09 NOTE — ED Provider Notes (Signed)
 [facility] Provider Note    Event Date/Time   First MD Initiated Contact with Patient 08/09/23 [phone]     (approximate)   History   Respiratory Distress   HPI  Jordan Chan is a 73 y.o. female who presents to the ED for evaluation of Respiratory Distress   Review pain procedure visit from 12/31.  Intrathecal pump with morphine , bupivacaine and clonidine .  Patient presents from home via EMS for evaluation of shortness of breath.  Initial EMS call out was for shortness of breath, they found her unresponsive at home.  EMS did call a STEMI alert from the field.  I canceled this on arrival to the ED as EKG does not show STEMI.  I also show these EKGs to cardiology on-call who agrees.  History is limited as patient presents unresponsive and being bagged by BVM with EMS.  They found her with sats 50-60% on room air   Physical Exam   Triage Vital Signs: ED Triage Vitals [08/09/23 0208]  Encounter Vitals Group     BP 122/65     Systolic BP Percentile      Diastolic BP Percentile      Pulse Rate (!) 114     Resp (!) 22     Temp      Temp src      SpO2 100 %     Weight      Height      Head Circumference      Peak Flow      Pain Score      Pain Loc      Pain Education      Exclude from Growth Chart     Most recent vital signs: Vitals:   08/09/23 0545 08/09/23 0600  BP: 129/62 (!) 129/55  Pulse: (!) 125 (!) 126  Resp: 20 20  Temp: (!) 97.2 F (36.2 C) (!) 97.2 F (36.2 C)  SpO2: 94% 93%    General: Unresponsive, breathing independently with agonal respirations. CV:  Good peripheral perfusion.  Tachycardic and regular Resp:  Agonal respirations, wheezing throughout with poor airflow, crackles throughout as well Abd:  No distention.  Soft, palpable RLQ subcutaneous device MSK:  No deformity noted.  Neuro:  No focal deficits appreciated. Other:     ED Results / Procedures / Treatments   Labs (all labs ordered are listed, but  only abnormal results are displayed) Labs Reviewed  LACTIC ACID, PLASMA - Abnormal; Notable for the following components:      Result Value   Lactic Acid, Venous >9.0 (*)    All other components within normal limits  LACTIC ACID, PLASMA - Abnormal; Notable for the following components:   Lactic Acid, Venous 5.5 (*)    All other components within normal limits  COMPREHENSIVE METABOLIC PANEL - Abnormal; Notable for the following components:   CO2 14 (*)    Glucose, Bld 418 (*)    Creatinine, Ser 1.23 (*)    Calcium  8.7 (*)    Albumin 2.9 (*)    AST 97 (*)    ALT 51 (*)    Alkaline Phosphatase 129 (*)    GFR, Estimated 46 (*)    Anion gap 20 (*)    All other components within normal limits  CBC WITH DIFFERENTIAL/PLATELET - Abnormal; Notable for the following components:   WBC 25.2 (*)    Hemoglobin 11.6 (*)    MCV 101.5 (*)    MCHC 27.9 (*)  RDW 15.6 (*)    Platelets 530 (*)    Neutro Abs 8.2 (*)    Lymphs Abs 13.7 (*)    Monocytes Absolute 1.9 (*)    Eosinophils Absolute 0.8 (*)    Basophils Absolute 0.2 (*)    Abs Immature Granulocytes 0.41 (*)    All other components within normal limits  PROTIME-INR - Abnormal; Notable for the following components:   Prothrombin Time 17.3 (*)    INR 1.4 (*)    All other components within normal limits  APTT - Abnormal; Notable for the following components:   aPTT 38 (*)    All other components within normal limits  BRAIN NATRIURETIC PEPTIDE - Abnormal; Notable for the following components:   B Natriuretic Peptide 241.9 (*)    All other components within normal limits  URINALYSIS, ROUTINE W REFLEX MICROSCOPIC - Abnormal; Notable for the following components:   Color, Urine YELLOW (*)    APPearance HAZY (*)    Glucose, UA >=500 (*)    Hgb urine dipstick SMALL (*)    Protein, ur 100 (*)    Bacteria, UA RARE (*)    All other components within normal limits  BLOOD GAS, ARTERIAL - Abnormal; Notable for the following components:    pH, Arterial 7.03 (*)    pCO2 arterial 51 (*)    Bicarbonate 13.5 (*)    Acid-base deficit 17.3 (*)    All other components within normal limits  BETA-HYDROXYBUTYRIC ACID - Abnormal; Notable for the following components:   Beta-Hydroxybutyric Acid 0.39 (*)    All other components within normal limits  URINE DRUG SCREEN, QUALITATIVE ([facility] ONLY) - Abnormal; Notable for the following components:   Opiate, Ur Screen POSITIVE (*)    All other components within normal limits  CBG MONITORING, ED - Abnormal; Notable for the following components:   Glucose-Capillary 292 (*)    All other components within normal limits  TROPONIN I (HIGH SENSITIVITY) - Abnormal; Notable for the following components:   Troponin I (High Sensitivity) 20 (*)    All other components within normal limits  TROPONIN I (HIGH SENSITIVITY) - Abnormal; Notable for the following components:   Troponin I (High Sensitivity) 95 (*)    All other components within normal limits  CULTURE, BLOOD (ROUTINE X 2)  RESP PANEL BY RT-PCR (RSV, FLU A&B, COVID)  RVPGX2  MRSA NEXT GEN BY PCR, NASAL  CULTURE, BLOOD (ROUTINE X 2)  RESPIRATORY PANEL BY PCR  MAGNESIUM   TSH  T4, FREE  PROCALCITONIN  CBC  LEGIONELLA PNEUMOPHILA SEROGP 1 UR AG  STREP PNEUMONIAE URINARY ANTIGEN    EKG Sinus tachycardia with a rate of 116 bpm.  Leftward axis.  Left bundle.  No STEMI by Sgarbossa criteria.  RADIOLOGY 1 view CXR interpreted by me with ETT in good position, multifocal infiltrates throughout KUB interpreted by me with OG in the stomach  Official radiology report(s): DG Abd Portable 1 View Result Date: 08/09/2023 CLINICAL DATA:  OG tube placement EXAM: PORTABLE ABDOMEN - 1 VIEW COMPARISON:  None Available. FINDINGS: OG tube is in the stomach. Diffuse bilateral airspace disease seen in the visualized lower lungs. IMPRESSION: OG tube in the stomach. Electronically Signed   By: Franky Crease M.D.   On: 08/09/2023 02:52   DG Chest Port 1  View Result Date: 08/09/2023 CLINICAL DATA:  Post intubation, OG tube placement EXAM: PORTABLE CHEST 1 VIEW COMPARISON:  07/23/2023 FINDINGS: Endotracheal tube is 3.3 cm above the carina. OG tube enters  the stomach. Heart and mediastinal contours are within normal limits. Diffuse bilateral airspace disease. No effusions or pneumothorax. No acute bony abnormality. IMPRESSION: Support devices in expected position. Diffuse bilateral airspace disease could reflect edema or infection. Electronically Signed   By: Franky Crease M.D.   On: 08/09/2023 02:52    PROCEDURES and INTERVENTIONS:  .Critical Care  Performed by: Claudene Rover, MD Authorized by: Claudene Rover, MD   Critical care provider statement:    Critical care time (minutes):  30   Critical care time was exclusive of:  Separately billable procedures and treating other patients   Critical care was necessary to treat or prevent imminent or life-threatening deterioration of the following conditions:  Respiratory failure and sepsis   Critical care was time spent personally by me on the following activities:  Development of treatment plan with patient or surrogate, discussions with consultants, evaluation of patient's response to treatment, examination of patient, ordering and review of laboratory studies, ordering and review of radiographic studies, ordering and performing treatments and interventions, pulse oximetry, re-evaluation of patient's condition and review of old charts .1-3 Lead EKG Interpretation  Performed by: Claudene Rover, MD Authorized by: Claudene Rover, MD     Interpretation: abnormal     ECG rate:  120   ECG rate assessment: tachycardic     Rhythm: sinus tachycardia     Ectopy: none     Conduction: normal   Procedure Name: Intubation Date/Time: 08/09/2023 6:13 AM  Performed by: Claudene Rover, MDPre-anesthesia Checklist: Patient identified, Patient being monitored, Emergency Drugs available, Timeout performed and Suction  available Oxygen  Delivery Method: Non-rebreather mask Preoxygenation: Pre-oxygenation with 100% oxygen  Induction Type: Rapid sequence Ventilation: Mask ventilation without difficulty Laryngoscope Size: Glidescope and 3 Tube size: 7.5 mm Number of attempts: 1 Airway Equipment and Method: Rigid stylet Placement Confirmation: ETT inserted through vocal cords under direct vision, CO2 detector and Breath sounds checked- equal and bilateral Secured at: 22 cm Tube secured with: ETT holder      Medications  fentaNYL  in NS (75mcg/ml) infusion-PREMIX (75 mcg/hr Intravenous IV Pump Association 08/09/23 0603)  propofol  (DIPRIVAN ) 1000 MG/100ML infusion (10 mcg/kg/min  65.3 kg Intravenous IV Pump Association 08/09/23 0605)  vancomycin  (VANCOREADY) IVPB 1500 mg/300 mL ( Intravenous Infusion Verify 08/09/23 0600)  docusate sodium  (COLACE) capsule 100 mg (has no administration in time range)  polyethylene glycol (MIRALAX  / GLYCOLAX ) packet 17 g (has no administration in time range)  enoxaparin  (LOVENOX ) injection 40 mg (has no administration in time range)  pantoprazole  (PROTONIX ) EC tablet 40 mg (has no administration in time range)  ipratropium-albuterol  (DUONEB) 0.5-2.5 (3) MG/3ML nebulizer solution 3 mL (3 mLs Nebulization Not Given 08/09/23 0316)  ipratropium-albuterol  (DUONEB) 0.5-2.5 (3) MG/3ML nebulizer solution 3 mL (has no administration in time range)  sodium bicarbonate  1 mEq/mL injection (  Not Given 08/09/23 0406)  sodium bicarbonate  1 mEq/mL injection (  Not Given 08/09/23 0405)  sodium chloride  0.9 % bolus 1,000 mL (has no administration in time range)  Chlorhexidine  Gluconate Cloth 2 % PADS 6 each (has no administration in time range)  ceFEPIme  (MAXIPIME ) 2 g in sodium chloride  0.9 % 100 mL IVPB (has no administration in time range)  vancomycin  (VANCOCIN ) IVPB 1000 mg/200 mL premix (has no administration in time range)  ipratropium-albuterol  (DUONEB) 0.5-2.5 (3) MG/3ML nebulizer  solution 6 mL (6 mLs Nebulization Given 08/09/23 0230)  methylPREDNISolone  sodium succinate  (SOLU-MEDROL ) 125 mg/2 mL injection 125 mg (125 mg Intravenous Given 08/09/23 0244)  etomidate  (AMIDATE )  injection 20 mg (20 mg Intravenous Given 08/09/23 0211)  rocuronium  (ZEMURON ) injection 100 mg (100 mg Intravenous Given 08/09/23 0212)  fentaNYL  (SUBLIMAZE ) bolus via infusion 100 mcg (100 mcg Intravenous Bolus from Bag 08/09/23 0215)  midazolam  (VERSED ) 5 MG/5ML injection 5 mg (5 mg Intravenous Given 08/09/23 0232)  ceFEPIme  (MAXIPIME ) 2 g in sodium chloride  0.9 % 100 mL IVPB (0 g Intravenous Stopping previously hung infusion 08/09/23 0404)  sodium bicarbonate  injection 100 mEq (100 mEq Intravenous Given 08/09/23 0325)  sodium chloride  0.9 % bolus 2,000 mL (0 mLs Intravenous Stopped 08/09/23 0600)     IMPRESSION / MDM / ASSESSMENT AND PLAN / ED COURSE  I reviewed the triage vital signs and the nursing notes.  Differential diagnosis includes, but is not limited to, hypercarbia, STEMI, CHF exacerbation, pneumonia, sepsis  {Patient presents with symptoms of an acute illness or injury that is potentially life-threatening.  Patient presents unresponsive with signs of severe sepsis and respiratory failure requiring intubation and ICU admission.  Remained stable hemodynamically without indications for pressors or any loss of pulses.  Intubated on arrival.  Workup with signs of sepsis from pneumonia, elevated lactic acid improving with resuscitation.  Arterial pH 7.03 with a mixed acidosis, primarily metabolic.  Leukocytosis is noted.  AKI.  Clinical Course as of 08/09/23 0615  [place] Aug 09, 2023  0231 Reassessed.  Will increase sedation due to her pre-existing pain pump.  Increased propofol  and fentanyl  doses.  We will push Versed  as well [DS]  0234 Consult with ICU NP who agrees to see for admission [DS]  0248 White blood cell count and x-ray as noted.  Ordered antibiotics [DS]  0321 After ABG and lactic acid noted,  ordered additional IV fluids despite my concerns for CHF exacerbation due to the more pressing concerns of severe sepsis. [DS]    Clinical Course User Index [DS] Claudene Rover, MD     FINAL CLINICAL IMPRESSION(S) / ED DIAGNOSES   Final diagnoses:  Acute respiratory failure with hypoxia and hypercapnia (HCC)  Sepsis due to pneumonia [facility])     Rx / DC Orders   ED Discharge Orders     None        Note:  This document was prepared using Dragon voice recognition software and may include unintentional dictation errors.   Claudene Rover, MD 08/09/23 [phone]

## 2023-08-09 NOTE — ED Notes (Signed)
 Code Stemi called in the field

## 2023-08-09 NOTE — ED Notes (Signed)
 SBAR hand off report given to ICU RN, [place].

## 2023-08-09 NOTE — IPAL (Signed)
 GOALS OF CARE FAMILY CONFERENCE   Current clinical status, hospital findings and medical plan was reviewed with family.   Updated and notified of patients ongoing immediate critical medical problems.   Patient remains severely critically ill.    Patient with baseline CHF, , chronic pain syndrome with intrathecal pump, breast cancer hx, COPD, DM, recurrent exacerbation of COPD now coming in with shock due to sepsis with pneumonia and bilateral PE  Patient is unable to breathe independently on MV in shock with DKA and severe bilateral pneumonia  Explained to family course of therapy and the modalities   Patient with high chance of in hospital death due to severe critical illness and comorbid status  Family is appreciative of care and relate understanding that patient is severely critically ill with anticipation of passing away during this hospitalization.   They have consented and agreed to DNR  Palliative care consultation has been ordered.   Family are satisfied with Plan of action and management. All questions answered  Additional Critical Care time 35 mins    Halina Picking, M.D.  Pulmonary & Critical Care Medicine  [facility]

## 2023-08-09 NOTE — Progress Notes (Signed)
 Pharmacy Antibiotic Note  Jordan Chan is a 73 y.o. female admitted on 08/09/2023 with sepsis.  Pharmacy has been consulted for Vanc, cefepime  dosing.  Plan: Cefepime  2 gm IV X 1 given in ED on 1/5 @ 0315. Cefepime  2 gm IV Q12H ordered to start @ 1500.   Vancomycin  1500 mg IV X 1 ordered to be given on 1/5 @ ~ 0600. Vancomycin  1 gm IV Q48H ordered to start on 1/7 @ ~ 0600.  AUC = 560.6 Vanc trough = 12.0   Height: 4' 10.27 (148 cm) Weight: 68 kg (149 lb 14.6 oz) IBW/kg (Calculated) : 41.52  Temp (24hrs), Avg:96.2 F (35.7 C), Min:92 F (33.3 C), Max:97.9 F (36.6 C)  Recent Labs  Lab 08/09/23 0214  WBC 25.2*  CREATININE 1.23*  LATICACIDVEN >9.0*    Estimated Creatinine Clearance: 33.5 mL/min (A) (by C-G formula based on SCr of 1.23 mg/dL (H)).    Allergies  Allergen Reactions   Other Palpitations    IV steroids   Pain Patch [Menthol] Anaphylaxis   Avelox  [Moxifloxacin  Hcl In Nacl] Other (See Comments)    Upset stomach   Doxycycline  Diarrhea   Erythromycin  Nausea Only and Other (See Comments)    Can take a Z-Pak just fine Other reaction(s): Other (See Comments) Can take Z-Pak  Can take a Z-Pak just fine   Fentanyl  Nausea Only and Rash   Moxifloxacin  Hcl Other (See Comments)    Upset stomach Upset stomach   Oxycontin [Oxycodone] Hives   Ozempic [Semaglutide] Nausea Only    Antimicrobials this admission:   >>    >>   Dose adjustments this admission:   Microbiology results:  BCx:   UCx:    Sputum:    MRSA PCR:   Thank you for allowing pharmacy to be a part of this patient's care.  Shelah Heatley D 08/09/2023 5:06 AM

## 2023-08-09 NOTE — ED Notes (Signed)
@   0220 spoke with Sally/ Code stemi cancelled per MD

## 2023-08-09 NOTE — Progress Notes (Signed)
 PHARMACY - ANTICOAGULATION CONSULT NOTE  Pharmacy Consult for Heparin   Indication: atrial fibrillation and pulmonary embolus  Allergies  Allergen Reactions   Other Palpitations    IV steroids   Pain Patch [Menthol] Anaphylaxis   Avelox  [Moxifloxacin  Hcl In Nacl] Other (See Comments)    Upset stomach   Doxycycline  Diarrhea   Erythromycin  Nausea Only and Other (See Comments)    Can take a Z-Pak just fine Other reaction(s): Other (See Comments) Can take Z-Pak  Can take a Z-Pak just fine   Fentanyl  Nausea Only and Rash   Moxifloxacin  Hcl Other (See Comments)    Upset stomach Upset stomach   Oxycontin [Oxycodone] Hives   Ozempic [Semaglutide] Nausea Only    Patient Measurements: Height: 4' 10.27 (148 cm) Weight: 68 kg (149 lb 14.6 oz) IBW/kg (Calculated) : 41.52 Heparin  Dosing Weight: 56.7 kg   Vital Signs: Temp: 100.4 F (38 C) (01/05 1815) Temp Source: Bladder (01/05 0715) BP: 113/51 (01/05 1815) Pulse Rate: 105 (01/05 1815)  Labs: Recent Labs    08/09/23 0214 08/09/23 0456 08/09/23 0744 08/09/23 0954 08/09/23 1259 08/09/23 1741  HGB 11.6* 10.7*  --   --   --   --   HCT 41.6 35.8*  --   --   --   --   PLT 530* 453*  --   --   --   --   APTT 38*  --   --   --   --  55*  LABPROT 17.3*  --   --   --   --   --   INR 1.4*  --   --   --   --   --   HEPARINUNFRC  --   --  >1.10*  --   --   --   CREATININE 1.23* 1.13*  --  1.16* 1.14* 1.16*  TROPONINIHS 20* 95*  --   --   --   --     Estimated Creatinine Clearance: 35.5 mL/min (A) (by C-G formula based on SCr of 1.16 mg/dL (H)).   Medical History: Past Medical History:  Diagnosis Date   Arthritis    Asthma    Atrial fibrillation (HCC)    Breast cancer (HCC) 1998   right breast ca with mastectomy and chemotherapy and radiation   Bronchitis    CHF (congestive heart failure) (HCC)    with Morphine  withdrawal   COPD (chronic obstructive pulmonary disease) (HCC)    Diabetes mellitus without complication  (HCC)    Diverticulitis    diverticulosis also   Dyspnea    Endometriosis    GERD (gastroesophageal reflux disease)    History of shingles 2000-2005   Hypercholesteremia    Hypertension    IBS (irritable bowel syndrome)    Low back pain    a. Implanted morphine /bupivicaine/clonidine  pump.   Neuropathy    Orthopnea    Oxygen  dependent    uses at night   Personal history of chemotherapy    Personal history of radiation therapy    Pneumonia    pneumonia 5-6 times, history of bronchitis also   Scoliosis    Sleep apnea    does not use cpap   Stroke (HCC) 2010   TIA, 10 years ago   Withdrawal from sedative drug (HCC)    withdrawal from morphine  when pump batteries died    Medications:  Medications Prior to Admission  Medication Sig Dispense Refill Last Dose/Taking   ACCU-CHEK AVIVA PLUS test strip TEST THREE  TIMES DAILY 300 strip 3    Accu-Chek Softclix Lancets lancets TEST BLOOD SUGAR THREE TIMES DAILY 300 each 3    albuterol  (PROVENTIL  HFA;VENTOLIN  HFA) 108 (90 BASE) MCG/ACT inhaler Inhale 2 puffs into the lungs every 6 (six) hours as needed for wheezing or shortness of breath.       albuterol  (PROVENTIL ) (2.5 MG/3ML) 0.083% nebulizer solution Take 3 mLs (2.5 mg total) by nebulization every 4 (four) hours as needed for wheezing or shortness of breath. 75 mL 2    Alcohol  Swabs (DROPSAFE ALCOHOL  PREP) 70 % PADS USE TWICE DAILY  WITH  SUGAR  CHECKS 200 each 2    apixaban  (ELIQUIS ) 5 MG TABS tablet Take 1 tablet (5 mg total) by mouth 2 (two) times daily. 180 tablet 4    atorvastatin  (LIPITOR) 40 MG tablet Take 1 tablet by mouth once daily 90 tablet 0    Blood Glucose Monitoring Suppl (TRUE METRIX METER) w/Device KIT Use to check blood sugar 4 times a day 1 kit 4    busPIRone  (BUSPAR ) 5 MG tablet Take 1 tablet by mouth twice daily 60 tablet 11    cholecalciferol  (VITAMIN D3) 25 MCG (1000 UNIT) tablet Take 1,000 Units by mouth daily.      Continuous Glucose Receiver (FREESTYLE LIBRE 2  READER) DEVI To check blood sugars 3-4 times daily due to insulin  use. DX E11.40 2 each 5    Continuous Glucose Sensor (FREESTYLE LIBRE 2 SENSOR) MISC To check blood sugars 3-4 times daily due to insulin  use. DX E11.40 2 each 5    Cyanocobalamin  1000 MCG/ML KIT Inject 1,000 mcg as directed every 30 (thirty) days.      cyclobenzaprine  (FLEXERIL ) 5 MG tablet Take 5 mg by mouth 2 (two) times daily as needed for muscle spasms. Takes very rarely      Fluticasone -Umeclidin-Vilant (TRELEGY ELLIPTA ) 100-62.5-25 MCG/ACT AEPB INHALE 1 PUFF INTO THE LUNGS DAILY -42 DAY EXPIRATION AFTER OPENING FOIL TRAY 180 each 2    folic acid  (FOLVITE ) 1 MG tablet TAKE 1 TABLET EVERY DAY 90 tablet 0    furosemide  (LASIX ) 40 MG tablet Take 0.5 tablets (20 mg total) by mouth daily. 90 tablet 4    gabapentin  (NEURONTIN ) 800 MG tablet Take 1 tablet (800 mg total) by mouth 3 (three) times daily. 270 tablet 4    HYDROcodone -acetaminophen  (NORCO) 10-325 MG tablet Take 1 tablet by mouth every 4 (four) hours as needed.      insulin  lispro (HUMALOG  KWIKPEN) 100 UNIT/ML KwikPen Inject 5 Units into the skin 3 (three) times daily before meals. Do not inject insulin  if you are not going to eat meal.  Only take before a meal. 15 mL 3    losartan  (COZAAR ) 25 MG tablet Take 0.5 tablets (12.5 mg total) by mouth daily. 45 tablet 4    metFORMIN  (GLUCOPHAGE ) 1000 MG tablet Take 1 tablet (1,000 mg total) by mouth 2 (two) times daily with a meal. 180 tablet 4    metoprolol  succinate (TOPROL -XL) 25 MG 24 hr tablet Take 0.5 tablets (12.5 mg total) by mouth daily. 45 tablet 4    montelukast  (SINGULAIR ) 10 MG tablet Take 1 tablet (10 mg total) by mouth at bedtime. 90 tablet 4    naloxone  (NARCAN ) nasal spray 4 mg/0.1 mL Place 1 spray into the nose as needed for up to 365 doses (for opioid-induced respiratory depresssion). In case of emergency (overdose), spray once into each nostril. If no response within 3 minutes, repeat application and call  911. 1  each 1    Nebulizers (EASY AIR COMPRESSOR NEBULIZER) MISC Use to inhaler nebulizer treatments at needed per instructions on nebulizer prescription 1 each 0    ondansetron  (ZOFRAN ) 4 MG tablet Take 1 tablet (4 mg total) by mouth daily as needed for nausea or vomiting. 30 tablet 1    OXYGEN  Inhale 2 L into the lungs at bedtime.      PAIN MANAGEMENT INTRATHECAL, IT, PUMP 1 each by Intrathecal route. Intrathecal (IT) medication:  Morphine  5.0 mg/ml, Bupivacaine 20.0 mg/ml,  Clonidine  100.0 mcg/ml Patient does not remember current. Adjusted every 3 months at [facility]      pantoprazole  (PROTONIX ) 40 MG tablet Take 1 tablet by mouth once daily 90 tablet 0    TRESIBA  FLEXTOUCH 100 UNIT/ML FlexTouch Pen INJECT 55 UNITS SUBCUTANEOUSLY AT BEDTIME 30 mL 0    TRULICITY  4.5 MG/0.5ML SOPN INJECT 4.5MG  INTO THE SKIN ONCE WEEKLY AS DIRECTED 12 mL 0    venlafaxine  XR (EFFEXOR -XR) 150 MG 24 hr capsule Take 1 capsule by mouth once daily 90 capsule 0     Assessment: Pharmacy consulted to dose heparin  in this 73 year old female admitted with respiratory failure, possible PE.   Pt was on Eliquis  5 mg PO BID PTA for Afib.   Unsure of last dose.  CrCl = 36.5 ml/min   1/5 17:41 aPTT 55  Goal of Therapy:  aPTT : 66 - 102  Heparin  level 0.3-0.7 units/ml Monitor platelets by anticoagulation protocol: Yes   Plan:  - aPTT is subtherapeutic. - Heparin  1700 units IV bolus and increase infusion rate to 1150 units/hr.     - Will use aPTT to guide dosing until correlating with HL  - Next aPTT 8 hrs after rate change - Will draw HL on 1/6 with AM labs  Olam Fritter, PharmD, BCPS 08/09/2023 6:26 PM

## 2023-08-09 NOTE — Plan of Care (Signed)
  Problem: Coping: Goal: Level of anxiety will decrease Outcome: Progressing   Problem: Elimination: Goal: Will not experience complications related to bowel motility Outcome: Progressing Goal: Will not experience complications related to urinary retention Outcome: Progressing   Problem: Safety: Goal: Ability to remain free from injury will improve Outcome: Progressing   Problem: Education: Goal: Knowledge of General Education information will improve Description: Including pain rating scale, medication(s)/side effects and non-pharmacologic comfort measures Outcome: Not Progressing   Problem: Health Behavior/Discharge Planning: Goal: Ability to manage health-related needs will improve Outcome: Not Progressing   Problem: Clinical Measurements: Goal: Ability to maintain clinical measurements within normal limits will improve Outcome: Not Progressing Goal: Will remain free from infection Outcome: Not Progressing Goal: Diagnostic test results will improve Outcome: Not Progressing Goal: Respiratory complications will improve Outcome: Not Progressing Goal: Cardiovascular complication will be avoided Outcome: Not Progressing   Problem: Activity: Goal: Risk for activity intolerance will decrease Outcome: Not Progressing   Problem: Nutrition: Goal: Adequate nutrition will be maintained Outcome: Not Progressing   Problem: Pain Management: Goal: General experience of comfort will improve Outcome: Not Progressing   Problem: Skin Integrity: Goal: Risk for impaired skin integrity will decrease Outcome: Not Progressing

## 2023-08-09 NOTE — Consult Note (Signed)
 PHARMACY CONSULT NOTE - ELECTROLYTES  Pharmacy Consult for Electrolyte Monitoring and Replacement   Recent Labs: Height: 4' 10.27 (148 cm) Weight: 68 kg (149 lb 14.6 oz) IBW/kg (Calculated) : 41.52 Estimated Creatinine Clearance: 35.5 mL/min (A) (by C-G formula based on SCr of 1.16 mg/dL (H)). Potassium (mmol/L)  Date Value  08/09/2023 4.5  09/14/2013 3.5   Magnesium  (mg/dL)  Date Value  98/94/7974 2.3   Calcium  (mg/dL)  Date Value  98/94/7974 7.2 (L)   Calcium , Total (mg/dL)  Date Value  97/88/7984 9.0   Albumin (g/dL)  Date Value  98/94/7974 2.9 (L)  06/17/2023 4.2  09/14/2013 3.2 (L)   Phosphorus (mg/dL)  Date Value  97/90/7976 2.6   Sodium (mmol/L)  Date Value  08/09/2023 140  06/17/2023 138  09/14/2013 133 (L)    Assessment  Jordan Chan is a 73 y.o. female presenting with acute respiratory distress. PMH significant for paroxysmal atrial fibrillation, stress-induced cardiomyopathy. Pharmacy has been consulted to monitor and replace electrolytes.  Diet: NPO Medications: insulin  gtt  Goal of Therapy: Electrolytes WNL  Plan:  No electrolytes replacement needed Patient is currently on insulin  gtt, will check BMP q4h Check all electrolytes with AM labs  Thank you for allowing pharmacy to be a part of this patient's care.  Olam Fritter, PharmD, BCPS 08/09/2023 6:14 PM

## 2023-08-09 NOTE — Progress Notes (Signed)
Patient transported to CT and back to ICU. No issues with transport. 

## 2023-08-09 NOTE — Consult Note (Addendum)
 PHARMACY CONSULT NOTE - ELECTROLYTES  Pharmacy Consult for Electrolyte Monitoring and Replacement   Recent Labs: Height: 4' 10.27 (148 cm) Weight: 68 kg (149 lb 14.6 oz) IBW/kg (Calculated) : 41.52 Estimated Creatinine Clearance: 36.5 mL/min (A) (by C-G formula based on SCr of 1.13 mg/dL (H)). Potassium (mmol/L)  Date Value  08/09/2023 4.2  09/14/2013 3.5   Magnesium  (mg/dL)  Date Value  98/94/7974 2.3   Calcium  (mg/dL)  Date Value  98/94/7974 7.1 (L)   Calcium , Total (mg/dL)  Date Value  97/88/7984 9.0   Albumin (g/dL)  Date Value  98/94/7974 2.9 (L)  06/17/2023 4.2  09/14/2013 3.2 (L)   Phosphorus (mg/dL)  Date Value  97/90/7976 2.6   Sodium (mmol/L)  Date Value  08/09/2023 139  06/17/2023 138  09/14/2013 133 (L)    Assessment  Jordan Chan is a 73 y.o. female presenting with acute respiratory distress. PMH significant for paroxysmal atrial fibrillation, stress-induced cardiomyopathy. Pharmacy has been consulted to monitor and replace electrolytes.  Diet: NPO Medications: insulin  gtt  Goal of Therapy: Electrolytes WNL  Plan:  No electrolytes replacement needed Patient is currently on insulin  gtt, will check BMP q4h Check all electrolytes with AM labs  Thank you for allowing pharmacy to be a part of this patient's care.  Jaklyn Alen A Ahnya Akre, PharmD Clinical Pharmacist 08/09/2023 7:45 AM

## 2023-08-09 NOTE — Procedures (Signed)
 Central Venous Catheter Insertion Procedure Note  Jordan Chan  [id number]  08/12/1950  Date:08/09/23  Upfz:87:77 PM   Provider Performing:Britton Bera KANDICE Moose   Procedure: Insertion of Non-tunneled Central Venous Catheter(36556) with US  guidance (23062)   Indication(s) Medication administration and Difficult access  Consent Risks of the procedure as well as the alternatives and risks of each were explained to the patient and/or caregiver.  Consent for the procedure was obtained and is signed in the bedside chart  Anesthesia Topical only with 1% lidocaine    Timeout Verified patient identification, verified procedure, site/side was marked, verified correct patient position, special equipment/implants available, medications/allergies/relevant history reviewed, required imaging and test results available.  Sterile Technique Maximal sterile technique including full sterile barrier drape, hand hygiene, sterile gown, sterile gloves, mask, hair covering, sterile ultrasound probe cover (if used).  Procedure Description Area of catheter insertion was cleaned with chlorhexidine  and draped in sterile fashion.  With real-time ultrasound guidance a central venous catheter was placed into the right internal jugular vein. Nonpulsatile blood flow and easy flushing noted in all ports.  The catheter was sutured in place and sterile dressing applied.  Complications/Tolerance None; patient tolerated the procedure well. Chest X-ray is ordered to verify placement for internal jugular or subclavian cannulation.   Chest x-ray is not ordered for femoral cannulation.  EBL Minimal  Specimen(s) None  Lonell Moose, AGNP  Pulmonary/Critical Care Pager [phone] (please enter 7 digits) PCCM Consult Pager [phone] (please enter 7 digits)

## 2023-08-09 NOTE — ED Triage Notes (Signed)
 Pt arrives from home via [facility] in respiratory distress. Pt was satting at 50-60% on room air when ems arrived. EMS bagged pt until arrival at ED.   EMS Vitals: 96 % 02 mask bagging BP: 135/58 CBG:339 Hx of copd and HF

## 2023-08-09 NOTE — Progress Notes (Signed)
 Patient was transported to MRI and transported back to the ICU. There were no issues with the transport.

## 2023-08-10 ENCOUNTER — Other Ambulatory Visit: Payer: Self-pay

## 2023-08-10 ENCOUNTER — Encounter: Payer: Self-pay | Admitting: Pulmonary Disease

## 2023-08-10 ENCOUNTER — Ambulatory Visit: Payer: Medicare HMO | Admitting: Nurse Practitioner

## 2023-08-10 DIAGNOSIS — G928 Other toxic encephalopathy: Secondary | ICD-10-CM | POA: Diagnosis not present

## 2023-08-10 DIAGNOSIS — I5020 Unspecified systolic (congestive) heart failure: Secondary | ICD-10-CM

## 2023-08-10 DIAGNOSIS — G894 Chronic pain syndrome: Secondary | ICD-10-CM | POA: Diagnosis not present

## 2023-08-10 DIAGNOSIS — J9621 Acute and chronic respiratory failure with hypoxia: Secondary | ICD-10-CM | POA: Diagnosis not present

## 2023-08-10 LAB — BLOOD GAS, ARTERIAL
Acid-Base Excess: 0.3 mmol/L (ref 0.0–2.0)
Acid-base deficit: 3.8 mmol/L — ABNORMAL HIGH (ref 0.0–2.0)
Bicarbonate: 23.1 mmol/L (ref 20.0–28.0)
Bicarbonate: 25.4 mmol/L (ref 20.0–28.0)
FIO2: 40 %
FIO2: 60 %
MECHVT: 400 mL
MECHVT: 400 mL
Mechanical Rate: 24
Mechanical Rate: 24
O2 Saturation: 100 %
O2 Saturation: 98.7 %
PEEP: 8 cmH2O
PEEP: 8 cmH2O
Patient temperature: 37
Patient temperature: 37
pCO2 arterial: 42 mm[Hg] (ref 32–48)
pCO2 arterial: 48 mm[Hg] (ref 32–48)
pH, Arterial: 7.29 — ABNORMAL LOW (ref 7.35–7.45)
pH, Arterial: 7.39 (ref 7.35–7.45)
pO2, Arterial: 119 mm[Hg] — ABNORMAL HIGH (ref 83–108)
pO2, Arterial: 84 mm[Hg] (ref 83–108)

## 2023-08-10 LAB — CBC
HCT: 27.5 % — ABNORMAL LOW (ref 36.0–46.0)
Hemoglobin: 8.5 g/dL — ABNORMAL LOW (ref 12.0–15.0)
MCH: 28.3 pg (ref 26.0–34.0)
MCHC: 30.9 g/dL (ref 30.0–36.0)
MCV: 91.7 fL (ref 80.0–100.0)
Platelets: 379 10*3/uL (ref 150–400)
RBC: 3 MIL/uL — ABNORMAL LOW (ref 3.87–5.11)
RDW: 16 % — ABNORMAL HIGH (ref 11.5–15.5)
WBC: 26.6 10*3/uL — ABNORMAL HIGH (ref 4.0–10.5)
nRBC: 0 % (ref 0.0–0.2)

## 2023-08-10 LAB — APTT
aPTT: 194 s (ref 24–36)
aPTT: 59 s — ABNORMAL HIGH (ref 24–36)

## 2023-08-10 LAB — GLUCOSE, CAPILLARY
Glucose-Capillary: 105 mg/dL — ABNORMAL HIGH (ref 70–99)
Glucose-Capillary: 109 mg/dL — ABNORMAL HIGH (ref 70–99)
Glucose-Capillary: 112 mg/dL — ABNORMAL HIGH (ref 70–99)
Glucose-Capillary: 114 mg/dL — ABNORMAL HIGH (ref 70–99)
Glucose-Capillary: 116 mg/dL — ABNORMAL HIGH (ref 70–99)
Glucose-Capillary: 118 mg/dL — ABNORMAL HIGH (ref 70–99)
Glucose-Capillary: 127 mg/dL — ABNORMAL HIGH (ref 70–99)
Glucose-Capillary: 128 mg/dL — ABNORMAL HIGH (ref 70–99)
Glucose-Capillary: 132 mg/dL — ABNORMAL HIGH (ref 70–99)
Glucose-Capillary: 152 mg/dL — ABNORMAL HIGH (ref 70–99)
Glucose-Capillary: 156 mg/dL — ABNORMAL HIGH (ref 70–99)
Glucose-Capillary: 393 mg/dL — ABNORMAL HIGH (ref 70–99)

## 2023-08-10 LAB — BASIC METABOLIC PANEL
Anion gap: 8 (ref 5–15)
BUN: 20 mg/dL (ref 8–23)
CO2: 26 mmol/L (ref 22–32)
Calcium: 7.4 mg/dL — ABNORMAL LOW (ref 8.9–10.3)
Chloride: 103 mmol/L (ref 98–111)
Creatinine, Ser: 1.05 mg/dL — ABNORMAL HIGH (ref 0.44–1.00)
GFR, Estimated: 56 mL/min — ABNORMAL LOW (ref >60)
Glucose, Bld: 150 mg/dL — ABNORMAL HIGH (ref 70–99)
Potassium: 4.3 mmol/L (ref 3.5–5.1)
Sodium: 137 mmol/L (ref 135–145)

## 2023-08-10 LAB — COMPREHENSIVE METABOLIC PANEL
ALT: 53 U/L — ABNORMAL HIGH (ref 0–44)
AST: 38 U/L (ref 15–41)
Albumin: 2.4 g/dL — ABNORMAL LOW (ref 3.5–5.0)
Alkaline Phosphatase: 80 U/L (ref 38–126)
Anion gap: 9 (ref 5–15)
BUN: 23 mg/dL (ref 8–23)
CO2: 23 mmol/L (ref 22–32)
Calcium: 7.1 mg/dL — ABNORMAL LOW (ref 8.9–10.3)
Chloride: 106 mmol/L (ref 98–111)
Creatinine, Ser: 1.06 mg/dL — ABNORMAL HIGH (ref 0.44–1.00)
GFR, Estimated: 55 mL/min — ABNORMAL LOW (ref >60)
Glucose, Bld: 136 mg/dL — ABNORMAL HIGH (ref 70–99)
Potassium: 4.4 mmol/L (ref 3.5–5.1)
Sodium: 138 mmol/L (ref 135–145)
Total Bilirubin: 0.3 mg/dL (ref 0.0–1.2)
Total Protein: 5 g/dL — ABNORMAL LOW (ref 6.5–8.1)

## 2023-08-10 LAB — PHOSPHORUS: Phosphorus: 3.4 mg/dL (ref 2.5–4.6)

## 2023-08-10 LAB — HEPARIN LEVEL (UNFRACTIONATED): Heparin Unfractionated: >1.1 [IU]/mL — ABNORMAL HIGH (ref 0.30–0.70)

## 2023-08-10 LAB — LACTIC ACID, PLASMA: Lactic Acid, Venous: 1 mmol/L (ref 0.5–1.9)

## 2023-08-10 LAB — MAGNESIUM: Magnesium: 1.3 mg/dL — ABNORMAL LOW (ref 1.7–2.4)

## 2023-08-10 MED ORDER — FUROSEMIDE 10 MG/ML IJ SOLN
40.0000 mg | Freq: Once | INTRAMUSCULAR | Status: AC
  Administered 2023-08-10: 40 mg via INTRAVENOUS
  Filled 2023-08-10: qty 4

## 2023-08-10 MED ORDER — MIDAZOLAM HCL 2 MG/2ML IJ SOLN
INTRAMUSCULAR | Status: AC
  Administered 2023-08-10: 4 mg via INTRAVENOUS
  Filled 2023-08-10: qty 4

## 2023-08-10 MED ORDER — HEPARIN BOLUS VIA INFUSION
450.0000 [IU] | Freq: Once | INTRAVENOUS | Status: AC
  Administered 2023-08-10: 450 [IU] via INTRAVENOUS
  Filled 2023-08-10: qty 450

## 2023-08-10 MED ORDER — PROPOFOL 1000 MG/100ML IV EMUL
5.0000 ug/kg/min | INTRAVENOUS | Status: DC
  Administered 2023-08-10: 5 ug/kg/min via INTRAVENOUS
  Administered 2023-08-11: 30 ug/kg/min via INTRAVENOUS
  Administered 2023-08-11 – 2023-08-12 (×2): 45 ug/kg/min via INTRAVENOUS
  Administered 2023-08-12: 60 ug/kg/min via INTRAVENOUS
  Administered 2023-08-12 – 2023-08-13 (×6): 50 ug/kg/min via INTRAVENOUS
  Administered 2023-08-14: 20 ug/kg/min via INTRAVENOUS
  Administered 2023-08-14: 50 ug/kg/min via INTRAVENOUS
  Administered 2023-08-14: 45 ug/kg/min via INTRAVENOUS
  Administered 2023-08-15: 20 ug/kg/min via INTRAVENOUS
  Filled 2023-08-10 (×17): qty 100

## 2023-08-10 MED ORDER — DEXMEDETOMIDINE HCL IN NACL 400 MCG/100ML IV SOLN
0.0000 ug/kg/h | INTRAVENOUS | Status: DC
  Administered 2023-08-10 (×2): 1.2 ug/kg/h via INTRAVENOUS
  Administered 2023-08-10: 0.4 ug/kg/h via INTRAVENOUS
  Administered 2023-08-11 (×2): 1.2 ug/kg/h via INTRAVENOUS
  Filled 2023-08-10 (×5): qty 100

## 2023-08-10 MED ORDER — INSULIN ASPART 100 UNIT/ML IJ SOLN
0.0000 [IU] | INTRAMUSCULAR | Status: DC
  Administered 2023-08-10: 4 [IU] via SUBCUTANEOUS
  Administered 2023-08-10: 3 [IU] via SUBCUTANEOUS
  Administered 2023-08-11: 4 [IU] via SUBCUTANEOUS
  Administered 2023-08-11 (×2): 3 [IU] via SUBCUTANEOUS
  Administered 2023-08-11 – 2023-08-12 (×3): 7 [IU] via SUBCUTANEOUS
  Administered 2023-08-12: 4 [IU] via SUBCUTANEOUS
  Administered 2023-08-12: 7 [IU] via SUBCUTANEOUS
  Administered 2023-08-12: 3 [IU] via SUBCUTANEOUS
  Administered 2023-08-12: 11 [IU] via SUBCUTANEOUS
  Administered 2023-08-13: 7 [IU] via SUBCUTANEOUS
  Administered 2023-08-13: 4 [IU] via SUBCUTANEOUS
  Administered 2023-08-13 (×2): 7 [IU] via SUBCUTANEOUS
  Administered 2023-08-13 (×2): 11 [IU] via SUBCUTANEOUS
  Administered 2023-08-14 (×2): 7 [IU] via SUBCUTANEOUS
  Administered 2023-08-14: 4 [IU] via SUBCUTANEOUS
  Administered 2023-08-14: 11 [IU] via SUBCUTANEOUS
  Administered 2023-08-14: 3 [IU] via SUBCUTANEOUS
  Administered 2023-08-15: 4 [IU] via SUBCUTANEOUS
  Administered 2023-08-15 (×2): 7 [IU] via SUBCUTANEOUS
  Administered 2023-08-15 – 2023-08-16 (×6): 4 [IU] via SUBCUTANEOUS
  Filled 2023-08-10 (×30): qty 1

## 2023-08-10 MED ORDER — HEPARIN (PORCINE) 25000 UT/250ML-% IV SOLN
1350.0000 [IU]/h | INTRAVENOUS | Status: DC
  Administered 2023-08-10 (×2): 950 [IU]/h via INTRAVENOUS
  Administered 2023-08-11 – 2023-08-12 (×2): 1100 [IU]/h via INTRAVENOUS
  Administered 2023-08-14 (×2): 1500 [IU]/h via INTRAVENOUS
  Administered 2023-08-15: 1400 [IU]/h via INTRAVENOUS
  Administered 2023-08-16 – 2023-08-17 (×3): 1350 [IU]/h via INTRAVENOUS
  Filled 2023-08-10 (×10): qty 250

## 2023-08-10 MED ORDER — ACETAMINOPHEN 325 MG PO TABS
650.0000 mg | ORAL_TABLET | Freq: Four times a day (QID) | ORAL | Status: DC | PRN
  Administered 2023-08-10 – 2023-08-14 (×5): 650 mg
  Filled 2023-08-10 (×5): qty 2

## 2023-08-10 MED ORDER — GABAPENTIN 400 MG PO CAPS
400.0000 mg | ORAL_CAPSULE | Freq: Three times a day (TID) | ORAL | Status: DC
  Administered 2023-08-10 – 2023-08-15 (×13): 400 mg
  Filled 2023-08-10 (×15): qty 1

## 2023-08-10 MED ORDER — MAGNESIUM SULFATE 4 GM/100ML IV SOLN
4.0000 g | Freq: Once | INTRAVENOUS | Status: AC
Start: 2023-08-10 — End: 2023-08-10
  Administered 2023-08-10: 4 g via INTRAVENOUS
  Filled 2023-08-10: qty 100

## 2023-08-10 MED ORDER — MIDAZOLAM HCL 2 MG/2ML IJ SOLN
INTRAMUSCULAR | Status: AC
  Administered 2023-08-10: 2 mg via INTRAVENOUS
  Filled 2023-08-10: qty 2

## 2023-08-10 MED ORDER — MAGNESIUM SULFATE 4 GM/100ML IV SOLN: 4.0000 g | Freq: Once | INTRAVENOUS | Status: DC

## 2023-08-10 MED ORDER — MIDAZOLAM HCL 2 MG/2ML IJ SOLN: 2.0000 mg | Freq: Once | INTRAMUSCULAR | Status: AC

## 2023-08-10 MED ORDER — MIDAZOLAM HCL 2 MG/2ML IJ SOLN: 4.0000 mg | Freq: Once | INTRAMUSCULAR | Status: AC

## 2023-08-10 MED ORDER — DIAZEPAM 5 MG/ML IJ SOLN
10.0000 mg | Freq: Once | INTRAMUSCULAR | Status: AC
  Administered 2023-08-10: 10 mg via INTRAVENOUS
  Filled 2023-08-10: qty 2

## 2023-08-10 MED FILL — Medication: INTRATHECAL | Qty: 1 | Status: AC

## 2023-08-10 NOTE — Progress Notes (Signed)
 PHARMACY - ANTICOAGULATION CONSULT NOTE  Pharmacy Consult for Heparin   Indication: atrial fibrillation and pulmonary embolus  Allergies  Allergen Reactions   Other Palpitations    IV steroids   Pain Patch [Menthol] Anaphylaxis   Avelox  [Moxifloxacin  Hcl In Nacl] Other (See Comments)    Upset stomach   Doxycycline  Diarrhea   Erythromycin  Nausea Only and Other (See Comments)    Can take a Z-Pak just fine Other reaction(s): Other (See Comments) Can take Z-Pak  Can take a Z-Pak just fine   Fentanyl  Nausea Only and Rash   Moxifloxacin  Hcl Other (See Comments)    Upset stomach Upset stomach   Oxycontin [Oxycodone] Hives   Ozempic [Semaglutide] Nausea Only    Patient Measurements: Height: 4' 10.27 (148 cm) Weight: 72.8 kg (160 lb 7.9 oz) IBW/kg (Calculated) : 41.52 Heparin  Dosing Weight: 56.7 kg   Vital Signs: Temp: 101.7 F (38.7 C) (01/06 1700) BP: 86/47 (01/06 1600) Pulse Rate: 89 (01/06 1700)  Labs: Recent Labs    08/09/23 0214 08/09/23 0456 08/09/23 0744 08/09/23 0954 08/09/23 1741 08/09/23 2113 08/10/23 0304 08/10/23 1556  HGB 11.6* 10.7*  --   --   --   --  8.5*  --   HCT 41.6 35.8*  --   --   --   --  27.5*  --   PLT 530* 453*  --   --   --   --  379  --   APTT 38*  --   --   --  55*  --  194* 59*  LABPROT 17.3*  --   --   --   --   --   --   --   INR 1.4*  --   --   --   --   --   --   --   HEPARINUNFRC  --   --  >1.10*  --   --   --  >1.10*  --   CREATININE 1.23* 1.13*  --    < > 1.16* 1.13* 1.06*  --   TROPONINIHS 20* 95*  --   --   --   --   --   --    < > = values in this interval not displayed.    Estimated Creatinine Clearance: 40.3 mL/min (A) (by C-G formula based on SCr of 1.06 mg/dL (H)).   Medical History: Past Medical History:  Diagnosis Date   Arthritis    Asthma    Atrial fibrillation (HCC)    Breast cancer (HCC) 1998   right breast ca with mastectomy and chemotherapy and radiation   Bronchitis    CHF (congestive heart failure)  (HCC)    with Morphine  withdrawal   COPD (chronic obstructive pulmonary disease) (HCC)    Diabetes mellitus without complication (HCC)    Diverticulitis    diverticulosis also   Dyspnea    Endometriosis    GERD (gastroesophageal reflux disease)    History of shingles 2000-2005   Hypercholesteremia    Hypertension    IBS (irritable bowel syndrome)    Low back pain    a. Implanted morphine /bupivicaine/clonidine  pump.   Neuropathy    Orthopnea    Oxygen  dependent    uses at night   Personal history of chemotherapy    Personal history of radiation therapy    Pneumonia    pneumonia 5-6 times, history of bronchitis also   Scoliosis    Sleep apnea    does not use cpap  Stroke (HCC) 2010   TIA, 10 years ago   Withdrawal from sedative drug (HCC)    withdrawal from morphine  when pump batteries died    Medications:  Medications Prior to Admission  Medication Sig Dispense Refill Last Dose/Taking   ACCU-CHEK AVIVA PLUS test strip TEST THREE TIMES DAILY 300 strip 3    Accu-Chek Softclix Lancets lancets TEST BLOOD SUGAR THREE TIMES DAILY 300 each 3    albuterol  (PROVENTIL  HFA;VENTOLIN  HFA) 108 (90 BASE) MCG/ACT inhaler Inhale 2 puffs into the lungs every 6 (six) hours as needed for wheezing or shortness of breath.       albuterol  (PROVENTIL ) (2.5 MG/3ML) 0.083% nebulizer solution Take 3 mLs (2.5 mg total) by nebulization every 4 (four) hours as needed for wheezing or shortness of breath. 75 mL 2    Alcohol  Swabs (DROPSAFE ALCOHOL  PREP) 70 % PADS USE TWICE DAILY  WITH  SUGAR  CHECKS 200 each 2    apixaban  (ELIQUIS ) 5 MG TABS tablet Take 1 tablet (5 mg total) by mouth 2 (two) times daily. 180 tablet 4    atorvastatin  (LIPITOR) 40 MG tablet Take 1 tablet by mouth once daily 90 tablet 0    Blood Glucose Monitoring Suppl (TRUE METRIX METER) w/Device KIT Use to check blood sugar 4 times a day 1 kit 4    busPIRone  (BUSPAR ) 5 MG tablet Take 1 tablet by mouth twice daily 60 tablet 11     cholecalciferol  (VITAMIN D3) 25 MCG (1000 UNIT) tablet Take 1,000 Units by mouth daily.      Continuous Glucose Receiver (FREESTYLE LIBRE 2 READER) DEVI To check blood sugars 3-4 times daily due to insulin  use. DX E11.40 2 each 5    Continuous Glucose Sensor (FREESTYLE LIBRE 2 SENSOR) MISC To check blood sugars 3-4 times daily due to insulin  use. DX E11.40 2 each 5    Cyanocobalamin  1000 MCG/ML KIT Inject 1,000 mcg as directed every 30 (thirty) days.      cyclobenzaprine  (FLEXERIL ) 5 MG tablet Take 5 mg by mouth 2 (two) times daily as needed for muscle spasms. Takes very rarely      Fluticasone -Umeclidin-Vilant (TRELEGY ELLIPTA ) 100-62.5-25 MCG/ACT AEPB INHALE 1 PUFF INTO THE LUNGS DAILY -42 DAY EXPIRATION AFTER OPENING FOIL TRAY 180 each 2    folic acid  (FOLVITE ) 1 MG tablet TAKE 1 TABLET EVERY DAY 90 tablet 0    furosemide  (LASIX ) 40 MG tablet Take 0.5 tablets (20 mg total) by mouth daily. 90 tablet 4    gabapentin  (NEURONTIN ) 800 MG tablet Take 1 tablet (800 mg total) by mouth 3 (three) times daily. 270 tablet 4    HYDROcodone -acetaminophen  (NORCO) 10-325 MG tablet Take 1 tablet by mouth every 4 (four) hours as needed.      insulin  lispro (HUMALOG  KWIKPEN) 100 UNIT/ML KwikPen Inject 5 Units into the skin 3 (three) times daily before meals. Do not inject insulin  if you are not going to eat meal.  Only take before a meal. 15 mL 3    losartan  (COZAAR ) 25 MG tablet Take 0.5 tablets (12.5 mg total) by mouth daily. 45 tablet 4    metFORMIN  (GLUCOPHAGE ) 1000 MG tablet Take 1 tablet (1,000 mg total) by mouth 2 (two) times daily with a meal. 180 tablet 4    metoprolol  succinate (TOPROL -XL) 25 MG 24 hr tablet Take 0.5 tablets (12.5 mg total) by mouth daily. 45 tablet 4    montelukast  (SINGULAIR ) 10 MG tablet Take 1 tablet (10 mg total) by mouth at  bedtime. 90 tablet 4    naloxone  (NARCAN ) nasal spray 4 mg/0.1 mL Place 1 spray into the nose as needed for up to 365 doses (for opioid-induced respiratory  depresssion). In case of emergency (overdose), spray once into each nostril. If no response within 3 minutes, repeat application and call 911. 1 each 1    Nebulizers (EASY AIR COMPRESSOR NEBULIZER) MISC Use to inhaler nebulizer treatments at needed per instructions on nebulizer prescription 1 each 0    ondansetron  (ZOFRAN ) 4 MG tablet Take 1 tablet (4 mg total) by mouth daily as needed for nausea or vomiting. 30 tablet 1    OXYGEN  Inhale 2 L into the lungs at bedtime.      PAIN MANAGEMENT INTRATHECAL, IT, PUMP 1 each by Intrathecal route. Intrathecal (IT) medication:  Morphine  5.0 mg/ml, Bupivacaine 20.0 mg/ml,  Clonidine  100.0 mcg/ml Patient does not remember current. Adjusted every 3 months at [facility]      pantoprazole  (PROTONIX ) 40 MG tablet Take 1 tablet by mouth once daily 90 tablet 0    TRESIBA  FLEXTOUCH 100 UNIT/ML FlexTouch Pen INJECT 55 UNITS SUBCUTANEOUSLY AT BEDTIME 30 mL 0    TRULICITY  4.5 MG/0.5ML SOPN INJECT 4.5MG  INTO THE SKIN ONCE WEEKLY AS DIRECTED 12 mL 0    venlafaxine  XR (EFFEXOR -XR) 150 MG 24 hr capsule Take 1 capsule by mouth once daily 90 capsule 0     Assessment: Pharmacy consulted to dose heparin  in this 72 year old female admitted with respiratory failure, possible PE.   Pt was on Eliquis  5 mg PO BID PTA for Afib.   Unsure of last dose.  CrCl = 36.5 ml/min   1/5 1741 aPTT 55 1/6 0304 aPTT 194,  HL >1.10 1/6 1556 aPTT 59  subtherapeutic  Goal of Therapy:  aPTT : 66 - 102  Heparin  level 0.3-0.7 units/ml Monitor platelets by anticoagulation protocol: Yes   Plan:  To avoid further supratherapeutic aPTT levels, will give heparin  half bolus of 450 units x1 Increase heparin  infusion to 1050 units/hour Check aPTT level in 8 hours Monitor CBC and signs/symptoms of bleeding  Thank you for involving pharmacy in this patient's care.   Damien Napoleon, PharmD Clinical Pharmacist 08/10/2023 5:24 PM

## 2023-08-10 NOTE — Plan of Care (Signed)
  Problem: Education: Goal: Knowledge of General Education information will improve Description: Including pain rating scale, medication(s)/side effects and non-pharmacologic comfort measures Outcome: Progressing   Problem: Health Behavior/Discharge Planning: Goal: Ability to manage health-related needs will improve Outcome: Progressing   Problem: Clinical Measurements: Goal: Ability to maintain clinical measurements within normal limits will improve Outcome: Progressing Goal: Will remain free from infection Outcome: Progressing Goal: Diagnostic test results will improve Outcome: Progressing Goal: Respiratory complications will improve Outcome: Progressing Goal: Cardiovascular complication will be avoided Outcome: Progressing   Problem: Activity: Goal: Risk for activity intolerance will decrease Outcome: Progressing   Problem: Nutrition: Goal: Adequate nutrition will be maintained Outcome: Progressing   Problem: Coping: Goal: Level of anxiety will decrease Outcome: Not Progressing   Problem: Elimination: Goal: Will not experience complications related to bowel motility Outcome: Progressing Goal: Will not experience complications related to urinary retention Outcome: Progressing   Problem: Pain Management: Goal: General experience of comfort will improve Outcome: Progressing   Problem: Safety: Goal: Ability to remain free from injury will improve Outcome: Progressing   Problem: Skin Integrity: Goal: Risk for impaired skin integrity will decrease Outcome: Progressing   Problem: Education: Goal: Ability to describe self-care measures that may prevent or decrease complications (Diabetes Survival Skills Education) will improve Outcome: Progressing Goal: Individualized Educational Video(s) Outcome: Progressing   Problem: Coping: Goal: Ability to adjust to condition or change in health will improve Outcome: Not Progressing   Problem: Fluid Volume: Goal: Ability  to maintain a balanced intake and output will improve Outcome: Progressing   Problem: Health Behavior/Discharge Planning: Goal: Ability to identify and utilize available resources and services will improve Outcome: Progressing Goal: Ability to manage health-related needs will improve Outcome: Progressing   Problem: Metabolic: Goal: Ability to maintain appropriate glucose levels will improve Outcome: Progressing   Problem: Nutritional: Goal: Maintenance of adequate nutrition will improve Outcome: Progressing Goal: Progress toward achieving an optimal weight will improve Outcome: Progressing   Problem: Skin Integrity: Goal: Risk for impaired skin integrity will decrease Outcome: Progressing   Problem: Tissue Perfusion: Goal: Adequacy of tissue perfusion will improve Outcome: Progressing   Problem: Education: Goal: Ability to describe self-care measures that may prevent or decrease complications (Diabetes Survival Skills Education) will improve Outcome: Progressing Goal: Individualized Educational Video(s) Outcome: Progressing   Problem: Cardiac: Goal: Ability to maintain an adequate cardiac output will improve Outcome: Progressing   Problem: Health Behavior/Discharge Planning: Goal: Ability to identify and utilize available resources and services will improve Outcome: Progressing Goal: Ability to manage health-related needs will improve Outcome: Progressing   Problem: Fluid Volume: Goal: Ability to achieve a balanced intake and output will improve Outcome: Progressing   Problem: Metabolic: Goal: Ability to maintain appropriate glucose levels will improve Outcome: Progressing   Problem: Nutritional: Goal: Maintenance of adequate nutrition will improve Outcome: Progressing   Problem: Nutritional: Goal: Maintenance of adequate weight for body size and type will improve Outcome: Progressing   Problem: Respiratory: Goal: Will regain and/or maintain adequate  ventilation Outcome: Progressing   Problem: Urinary Elimination: Goal: Ability to achieve and maintain adequate renal perfusion and functioning will improve Outcome: Progressing   Pt currently intubated/sedated. POC addressed with family in the room throughout the shift. Frequent education and emotional support provided by this nurse and NP on shift.

## 2023-08-10 NOTE — Progress Notes (Signed)
 PHARMACY CONSULT NOTE - FOLLOW UP  Pharmacy Consult for Electrolyte Monitoring and Replacement   Recent Labs: Potassium (mmol/L)  Date Value  08/10/2023 4.4  09/14/2013 3.5   Magnesium  (mg/dL)  Date Value  98/93/7974 1.3 (L)   Calcium  (mg/dL)  Date Value  98/93/7974 7.1 (L)   Calcium , Total (mg/dL)  Date Value  97/88/7984 9.0   Albumin (g/dL)  Date Value  98/93/7974 2.4 (L)  06/17/2023 4.2  09/14/2013 3.2 (L)   Phosphorus (mg/dL)  Date Value  98/93/7974 3.4   Sodium (mmol/L)  Date Value  08/10/2023 138  06/17/2023 138  09/14/2013 133 (L)     Assessment: 22 YOF w/ PMH of CHF , chronic pain syndrome with intrathecal pump, breast cancer hx, COPD, DM, recurrent exacerbation of COPD now coming in with shock due to sepsis with pneumonia and bilateral PE. Pharmacy is asked to follow and replace electrolytes while in CCU  Goal of Therapy:  Electrolytes WNL  Plan:  ---4 grams IV magnesium  sulfate x 1 per NP ---recheck electrolytes in am  Jordan Chan ,PharmD Clinical Pharmacist 08/10/2023 7:16 AM

## 2023-08-10 NOTE — Inpatient Diabetes Management (Addendum)
 Inpatient Diabetes Program Recommendations  AACE/ADA: New Consensus Statement on Inpatient Glycemic Control   Target Ranges:  Prepandial:   less than 140 mg/dL      Peak postprandial:   less than 180 mg/dL (1-2 hours)      Critically ill patients:  140 - 180 mg/dL    Latest Reference Range & Units 08/10/23 01:03 08/10/23 02:00 08/10/23 03:01 08/10/23 04:06 08/10/23 04:44  Glucose-Capillary 70 - 99 mg/dL 887 (H) 871 (H) 872 (H) 118 (H) 116 (H)    Latest Reference Range & Units 08/10/23 03:04  CO2 22 - 32 mmol/L 23  Glucose 70 - 99 mg/dL 863 (H)  Anion gap 5 - 15  9    Latest Reference Range & Units 08/09/23 02:14  CO2 22 - 32 mmol/L 14 (L)  Glucose 70 - 99 mg/dL 581 (H)  Anion gap 5 - 15  20 (H)    Review of Glycemic Control  Diabetes history: DM2 Outpatient Diabetes medications: Tresiba  55 units at bedtime, Humalog  5 units TID with meals, Metformin  1000 mg BID, Trulicity  4.5 mg Qweek, FreeStyle Libre2 CGM Current orders for Inpatient glycemic control: Novolog  0-20 units Q4H (ordered at 4:19 am to start at 5:15 am)  Inpatient Diabetes Program Recommendations:    Insulin : IV insulin  was stopped at 4:40 am today. No basal insulin  given prior to stopping IV insulin  and no Novolog  given since IV insulin  stopped.  Please consider ordering Semglee  10 units Q24H.  NOTE: Patient with hx of DM2 admitted with acute on chronic respiratory failure, pneumonia, COPD exacerbation, possible pulmonary edema, sepsis, AKI, and stress induced cardiomyopathy. Initial glucose 418 mg/dl at 7:85 am on 03/07/73 and DKA was treated with IV insulin . Per chart, IV insulin  was stopped at 4:40 am today and last CBG was 116 mg/dl at 5:55 am. NO basal insulin  was given prior to stopping IV insulin  and NO Novolog  correction insulin  has been given since IV insulin  was stopped. Anticipate glucose is going to be elevated this morning. Sent chat message to Rosina, RN to make aware that patient was not transitioned off IV  insulin  correctly and glucose is likely to be elevated this morning. Asked Rosina, RN to check patient's CBG as soon as possible this morning.  Thanks, Earnie Gainer, RN, MSN, CDCES Diabetes Coordinator Inpatient Diabetes Program [phone] (Team Pager from 8am to 5pm)

## 2023-08-10 NOTE — Progress Notes (Signed)
 NAME:  Jordan Chan, MRN:  [id number], DOB:  [birth year], LOS: 1 ADMISSION DATE:  08/09/2023, CHIEF COMPLAINT:  Acute Hypoxic Respiratory Fialure   History of Present Illness:   73 y.o female with significant PMH of B12 deficiency, chronic pain syndrome s/p intrathecal pump, paroxysmal atrial fibrillation, stress-induced cardiomyopathy in the setting of pain pump malfunction and opioid withdrawal, PE following hospitalization for pneumonia, DM, COPD, GERD, OSA, and IBS who presented to the ED with chief complaints of acute respiratory distress found with sats in the 50 to 60% on RA per EMS.   ED Course: Initial vital signs showed HR of 142 beats/minute, BP mm Hg, the RR 23 breaths/minute, and the oxygen  saturation 100% on BVM and a temperature of 92.76F (33.3C).  Patient was intubated for airway protection. Pertinent Labs/Diagnostics Findings: Na+/ K+: 136/4.2 glucose: 418 BUN/Cr.:  13/1.23 AST/ALT: 97/51, CO2 14, anion gap 20 WBC: 25.2 K/L without bands or neutrophil predominance, Plts: 530 PCT:  Lactic acid: >9.0 COVID PCR: Negative,  troponin: 20  BNP:241.9 ABG: pO2 96; pCO2 51; pH 7.03;  HCO3 13.5, %O2 Sat 96.4.  CXR> see result ED Medications: Patient treated with Duonebs, Solumedrol 125 mg x 1, 30 cc/kg of fluids and started on broad-spectrum antibiotics Vanco cefepime  and Flagyl  for suspected sepsis    Pertinent  Medical History   B12 deficiency, chronic pain syndrome s/p intrathecal pump, paroxysmal atrial fibrillation, stress-induced cardiomyopathy in the setting of pain pump malfunction and opioid withdrawal, PE following hospitalization for pneumonia, DM, COPD, GERD, OSA, and IBS   Significant Hospital Events: Including procedures, antibiotic start and stop dates in addition to other pertinent events   1/5: Admitted to ICU with acute hypoxic hypercapnic respiratory failure secondary to suspected pneumonia and AECOPD. 1/6: failed SBT, hypertensive off sedation. Failed  switch to dexmedetomidine   Interim History / Subjective:  Patient is sedated, but does follow commands on lower of sedative dose. Anxious and uncomfortable when awake  Objective   Blood pressure (!) 86/47, pulse 95, temperature (!) 102 F (38.9 C), resp. rate (!) 24, height 4' 10.27 (1.48 m), weight 72.8 kg, SpO2 99%.    Vent Mode: PRVC FiO2 (%):  [35 %-80 %] 40 % Set Rate:  [24 bmp] 24 bmp Vt Set:  [400 mL] 400 mL PEEP:  [8 cmH20] 8 cmH20 Plateau Pressure:  [17 cmH20-22 cmH20] 20 cmH20   Intake/Output Summary (Last 24 hours) at 08/10/2023 1629 Last data filed at 08/10/2023 1552 Gross per 24 hour  Intake 3118.55 ml  Output 3000 ml  Net 118.55 ml   Filed Weights   08/09/23 0400 08/10/23 0400  Weight: 68 kg 72.8 kg    Examination: Physical Exam Constitutional:      General: She is not in acute distress.    Appearance: She is ill-appearing.  HENT:     Head: Normocephalic.     Mouth/Throat:     Comments: ETT in place Cardiovascular:     Rate and Rhythm: Normal rate and regular rhythm.     Pulses: Normal pulses.     Heart sounds: Normal heart sounds.  Pulmonary:     Comments: Ventilated breath sounds bilaterally Neurological:     Mental Status: She is disoriented.     Comments: Sedated, ventilated. Does follow commands off sedation      Assessment & Plan:   Neurology #Toxic Metabolic Encephalopathy #Chronic Pain  Patient is maintained on an intra-thecal pump with morphine  and clonidine  and bupivacaine. Given pump chronicity, will hold off  on making any changes unless absolutely necessary. Patient with significant pain at baseline and is also maintained on buspirone  and gabapentin  which are held for now. She does follow commands when awake (for daily wake up assessment) but is extremely anxious. Failed attempt at dexmedetomidine  and we had to restart propofol .  -Maintain a RASS goal of -1 -Propofol  and fentanyl  to maintain RASS goal and pain control per CPOT <  2 -Avoid sedating medications as able -Daily wake up assessment  Cardiovascular #Circulatory Shock #Stress induced cardiomyopathy #Paroxysmal afib  Sedation related hypotensive briskly improves (with resultant hypertension during wake up assessment). She's received IV fluids given hypotension which we've held, and will begin to mobilize fluids with diuretics. Sinus tachycardia at baseline maintained on metoprolol , now held. BNP noted to be elevated at 241 suggesting a component of decompensated heart failure.  -on heparin  gtt for PE and afib -repeat lactic acid -IV furosemide  40 mg once -hold off on GDMT -repeat TTE in AM  Pulmonary #Acute Hypoxic Respiratory Failure  Noted to have bilateral consolidative opacities, bilateral small layering pleural effusions, as well as bilaterally consolidated/collapsed lower lobes. This is likely due to decompensated heart failure, but we will monitor the effusions for any signs of septation and/or organization. She was also found to have PE on imaging which is currently treated with heparin  gtt.  -Full vent support, implement lung protective strategies -Plateau pressures less than 30 cm H20 -Wean FiO2 & PEEP as tolerated to maintain O2 sats >92% -Follow intermittent Chest X-ray & ABG as needed -Daily SBT -Implement VAP Bundle -Prn Bronchodilators  Gastrointestinal  PPI for SUP, will initiate tube feeds tomorrow if she remains on the vent  Renal #AKI  Kidney function mildly elevated on presentation, improved on repeat this AM. Did receive IV furosemide  40 mg IV once today with good urine output. Will check electrolytes twice daily while we diurese her.  Endocrine  ICU glycemic protocol  Hem/Onc #VTE  She is maintained on apixaban  for history of PE and for afib and is noted to have acute PE on presentation. This is likely secondary to medication non-compliance. Continue heparin  for now, will consider hematology consult prior to  discharge.  -Continue heparin  gtt for PE.  ID  #CAP  Continue with broad spectrum antibiotics. Negative viral panel. Negative MRSA screen, negative strep pneumonia antigen. Pending legionella urinary antigen.  -send respiratory cultures   Best Practice (right click and Reselect all SmartList Selections daily)   Diet/type: NPO DVT prophylaxis systemic heparin  Pressure ulcer(s): N/A GI prophylaxis: PPI Lines: Central line, Arterial Line, and yes and it is still needed Foley:  Yes, and it is still needed Code Status:  DNR Last date of multidisciplinary goals of care discussion [08/10/2023]  Labs   CBC: Recent Labs  Lab 08/09/23 0214 08/09/23 0456 08/10/23 0304  WBC 25.2* 38.0* 26.6*  NEUTROABS 8.2*  --   --   HGB 11.6* 10.7* 8.5*  HCT 41.6 35.8* 27.5*  MCV 101.5* 94.2 91.7  PLT 530* 453* 379    Basic Metabolic Panel: Recent Labs  Lab 08/09/23 0214 08/09/23 0456 08/09/23 0954 08/09/23 1259 08/09/23 1741 08/09/23 2113 08/10/23 0304  NA 136   < > 139 138 140 139 138  K 4.2   < > 4.7 4.5 4.5 4.4 4.4  CL 102   < > 105 106 107 107 106  CO2 14*   < > 18* 19* 20* 21* 23  GLUCOSE 418*   < > 456* 279* 208* 190*  136*  BUN 13   < > 20 20 22 22 23   CREATININE 1.23*   < > 1.16* 1.14* 1.16* 1.13* 1.06*  CALCIUM  8.7*   < > 7.2* 6.9* 7.2* 7.2* 7.1*  MG 2.3  --   --   --   --   --  1.3*  PHOS  --   --   --   --   --   --  3.4   < > = values in this interval not displayed.   GFR: Estimated Creatinine Clearance: 40.3 mL/min (A) (by C-G formula based on SCr of 1.06 mg/dL (H)). Recent Labs  Lab 08/09/23 0214 08/09/23 0456 08/09/23 0942 08/09/23 1259 08/10/23 0304  PROCALCITON  --  0.33  --   --   --   WBC 25.2* 38.0*  --   --  26.6*  LATICACIDVEN >9.0* 5.5* 4.2* 4.6*  --     Liver Function Tests: Recent Labs  Lab 08/09/23 0214 08/10/23 0304  AST 97* 38  ALT 51* 53*  ALKPHOS 129* 80  BILITOT 0.3 0.3  PROT 6.7 5.0*  ALBUMIN 2.9* 2.4*   No results for  input(s): LIPASE, AMYLASE in the last 168 hours. No results for input(s): AMMONIA in the last 168 hours.  ABG    Component Value Date/Time   PHART 7.29 (L) 08/10/2023 0006   PCO2ART 48 08/10/2023 0006   PO2ART 119 (H) 08/10/2023 0006   HCO3 23.1 08/10/2023 0006   ACIDBASEDEF 3.8 (H) 08/10/2023 0006   O2SAT 100 08/10/2023 0006     Coagulation Profile: Recent Labs  Lab 08/09/23 0214  INR 1.4*    Cardiac Enzymes: No results for input(s): CKTOTAL, CKMB, CKMBINDEX, TROPONINI in the last 168 hours.  HbA1C: Hemoglobin A1C  Date/Time Value Ref Range Status  04/28/2016 12:00 AM 7.7%  Final   HB A1C (BAYER DCA - WAIVED)  Date/Time Value Ref Range Status  06/12/2023 09:28 AM 7.4 (H) 4.8 - 5.6 % Final    Comment:             Prediabetes: 5.7 - 6.4          Diabetes: >6.4          Glycemic control for adults with diabetes: <7.0   02/17/2023 01:36 PM 7.5 (H) 4.8 - 5.6 % Final    Comment:             Prediabetes: 5.7 - 6.4          Diabetes: >6.4          Glycemic control for adults with diabetes: <7.0    Hgb A1c MFr Bld  Date/Time Value Ref Range Status  04/16/2023 05:22 AM 6.7 (H) 4.8 - 5.6 % Final    Comment:    (NOTE)         Prediabetes: 5.7 - 6.4         Diabetes: >6.4         Glycemic control for adults with diabetes: <7.0     CBG: Recent Labs  Lab 08/10/23 0406 08/10/23 0444 08/10/23 0811 08/10/23 1108 08/10/23 1603  GLUCAP 118* 116* 152* 114* 105*    Review of Systems:   Unable to obtain  Past Medical History:  She,  has a past medical history of Arthritis, Asthma, Atrial fibrillation (HCC), Breast cancer (HCC) (1998), Bronchitis, CHF (congestive heart failure) (HCC), COPD (chronic obstructive pulmonary disease) (HCC), Diabetes mellitus without complication (HCC), Diverticulitis, Dyspnea, Endometriosis, GERD (gastroesophageal reflux disease), History of  shingles (2000-2005), Hypercholesteremia, Hypertension, IBS (irritable bowel syndrome),  Low back pain, Neuropathy, Orthopnea, Oxygen  dependent, Personal history of chemotherapy, Personal history of radiation therapy, Pneumonia, Scoliosis, Sleep apnea, Stroke (HCC) (2010), and 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   Surgical History:   Past Surgical History:  Procedure Laterality Date   ABDOMINAL HYSTERECTOMY  1987   BACK SURGERY     Tailbone removed following fracture   BREAST SURGERY Right    mastectomy   CARDIAC CATHETERIZATION     CATARACT EXTRACTION W/PHACO Right 05/19/2019   Procedure: CATARACT EXTRACTION PHACO AND INTRAOCULAR LENS PLACEMENT (IOC), RIGHT, DIABETIC;  Surgeon: Ferol Rogue, MD;  Location: [facility] ORS;  Service: Ophthalmology;  Laterality: Right;  Lot # [id number] H US : 00:43.8 CDE: 4.59   CATARACT EXTRACTION W/PHACO Left 06/16/2019   Procedure: CATARACT EXTRACTION PHACO AND INTRAOCULAR LENS PLACEMENT (IOC) LEFT VISION BLUE DIABETIC;  Surgeon: Ferol Rogue, MD;  Location: [facility] ORS;  Service: Ophthalmology;  Laterality: Left;  Lot #[id number] H US : 00:46.9 CDE: 6.53   COCCYX REMOVAL     COLONOSCOPY WITH PROPOFOL  N/A 01/01/2017   Procedure: COLONOSCOPY WITH PROPOFOL ;  Surgeon: Therisa Bi, MD;  Location: [facility] ENDOSCOPY;  Service: Endoscopy;  Laterality: N/A;   COLONOSCOPY WITH PROPOFOL  N/A 12/11/2021   Procedure: COLONOSCOPY WITH PROPOFOL ;  Surgeon: Unk Corinn Skiff, MD;  Location: [facility] ENDOSCOPY;  Service: Gastroenterology;  Laterality: N/A;   ELBOW ARTHROSCOPY WITH TENDON RECONSTRUCTION     ESOPHAGOGASTRODUODENOSCOPY (EGD) WITH PROPOFOL  N/A 01/01/2017   Procedure: ESOPHAGOGASTRODUODENOSCOPY (EGD) WITH PROPOFOL ;  Surgeon: Therisa Bi, MD;  Location: [facility] ENDOSCOPY;  Service: Endoscopy;  Laterality: N/A;   ESOPHAGOGASTRODUODENOSCOPY (EGD) WITH PROPOFOL  N/A 12/11/2021   Procedure: ESOPHAGOGASTRODUODENOSCOPY (EGD) WITH PROPOFOL ;  Surgeon: Unk Corinn Skiff, MD;  Location: [facility] ENDOSCOPY;  Service: Gastroenterology;  Laterality: N/A;   INTRATHECAL PUMP IMPLANT      LEFT HEART CATH AND CORONARY ANGIOGRAPHY N/A 04/28/2017   Procedure: LEFT HEART CATH AND CORONARY ANGIOGRAPHY;  Surgeon: Mady Bruckner, MD;  Location: [facility] INVASIVE CV LAB;  Service: Cardiovascular;  Laterality: N/A;   MASTECTOMY Right 06/1997   morphine  pump  2011   PLANTAR FASCIA RELEASE     TOTAL HIP ARTHROPLASTY Left    TRIGGER FINGER RELEASE       Social History:   reports that she quit smoking about 19 years ago. Her smoking use included cigarettes. She started smoking about 49 years ago. She has a 30 pack-year smoking history. She has never used smokeless tobacco. She reports that she does not drink alcohol  and does not use drugs.   Family History:  Her family history includes Arthritis in her paternal grandfather; Cancer in her paternal grandmother and sister; Cancer (age of onset: 15) in her father; Cancer (age of onset: 53) in her mother; Hyperlipidemia in her sister and son; Hypertension in her sister; Osteoporosis in her maternal grandmother; Seizures in her son; Thyroid  disease in her mother. There is no history of Breast cancer.   Allergies Allergies  Allergen Reactions   Other Palpitations    IV steroids   Pain Patch [Menthol] Anaphylaxis   Avelox  [Moxifloxacin  Hcl In Nacl] Other (See Comments)    Upset stomach   Doxycycline  Diarrhea   Erythromycin  Nausea Only and Other (See Comments)    Can take a Z-Pak just fine Other reaction(s): Other (See Comments) Can take Z-Pak  Can take a Z-Pak just fine   Fentanyl  Nausea Only and Rash   Moxifloxacin  Hcl Other (See Comments)    Upset stomach Upset stomach  Oxycontin [Oxycodone] Hives   Ozempic [Semaglutide] Nausea Only     Home Medications  Prior to Admission medications   Medication Sig Start Date End Date Taking? Authorizing Provider  ACCU-CHEK AVIVA PLUS test strip TEST THREE TIMES DAILY 05/11/21   [name], Jolene T, NP  Accu-Chek Softclix Lancets lancets TEST BLOOD SUGAR THREE TIMES DAILY 02/26/23   [name],  Jolene T, NP  albuterol  (PROVENTIL  HFA;VENTOLIN  HFA) 108 (90 BASE) MCG/ACT inhaler Inhale 2 puffs into the lungs every 6 (six) hours as needed for wheezing or shortness of breath.     [provider]  albuterol  (PROVENTIL ) (2.5 MG/3ML) 0.083% nebulizer solution Take 3 mLs (2.5 mg total) by nebulization every 4 (four) hours as needed for wheezing or shortness of breath. 07/18/22 08/04/23  Raenelle Coria, MD  Alcohol  Swabs (DROPSAFE ALCOHOL  PREP) 70 % PADS USE TWICE DAILY  WITH  SUGAR  CHECKS 11/14/21   [name], Jolene T, NP  apixaban  (ELIQUIS ) 5 MG TABS tablet Take 1 tablet (5 mg total) by mouth 2 (two) times daily. 06/24/22   [name], Jolene T, NP  atorvastatin  (LIPITOR) 40 MG tablet Take 1 tablet by mouth once daily 06/08/23   [name], Jolene T, NP  Blood Glucose Monitoring Suppl (TRUE METRIX METER) w/Device KIT Use to check blood sugar 4 times a day 05/14/21   [name], Jolene T, NP  busPIRone  (BUSPAR ) 5 MG tablet Take 1 tablet by mouth twice daily 06/02/23   [name], Jolene T, NP  cholecalciferol  (VITAMIN D3) 25 MCG (1000 UNIT) tablet Take 1,000 Units by mouth daily.    [provider]  Continuous Glucose Receiver (FREESTYLE LIBRE 2 READER) DEVI To check blood sugars 3-4 times daily due to insulin  use. DX E11.40 12/11/22   [name], Melanie T, NP  Continuous Glucose Sensor (FREESTYLE LIBRE 2 SENSOR) MISC To check blood sugars 3-4 times daily due to insulin  use. DX E11.40 12/30/22   [name], Jolene T, NP  Cyanocobalamin  1000 MCG/ML KIT Inject 1,000 mcg as directed every 30 (thirty) days.    [provider]  cyclobenzaprine  (FLEXERIL ) 5 MG tablet Take 5 mg by mouth 2 (two) times daily as needed for muscle spasms. Takes very rarely    [provider]  Fluticasone -Umeclidin-Vilant (TRELEGY ELLIPTA ) 100-62.5-25 MCG/ACT AEPB INHALE 1 PUFF INTO THE LUNGS DAILY -42 DAY EXPIRATION AFTER OPENING FOIL TRAY 02/13/23   [name], Jolene T, NP  folic acid  (FOLVITE ) 1 MG tablet TAKE 1  TABLET EVERY DAY 10/14/21   Dasie Tinnie MATSU, NP  furosemide  (LASIX ) 40 MG tablet Take 0.5 tablets (20 mg total) by mouth daily. 07/27/23   [name], Jolene T, NP  gabapentin  (NEURONTIN ) 800 MG tablet Take 1 tablet (800 mg total) by mouth 3 (three) times daily. 06/12/23   [name], Jolene T, NP  HYDROcodone -acetaminophen  (NORCO) 10-325 MG tablet Take 1 tablet by mouth every 4 (four) hours as needed.    [provider]  insulin  lispro (HUMALOG  KWIKPEN) 100 UNIT/ML KwikPen Inject 5 Units into the skin 3 (three) times daily before meals. Do not inject insulin  if you are not going to eat meal.  Only take before a meal. 12/11/22   [name], Jolene T, NP  losartan  (COZAAR ) 25 MG tablet Take 0.5 tablets (12.5 mg total) by mouth daily. 06/12/23   [name], Jolene T, NP  metFORMIN  (GLUCOPHAGE ) 1000 MG tablet Take 1 tablet (1,000 mg total) by mouth 2 (two) times daily with a meal. 06/12/23   [name], Jolene T, NP  metoprolol  succinate (TOPROL -XL) 25 MG  24 hr tablet Take 0.5 tablets (12.5 mg total) by mouth daily. 06/12/23   [name], Jolene T, NP  montelukast  (SINGULAIR ) 10 MG tablet Take 1 tablet (10 mg total) by mouth at bedtime. 06/12/23   [name], Jolene T, NP  naloxone  (NARCAN ) nasal spray 4 mg/0.1 mL Place 1 spray into the nose as needed for up to 365 doses (for opioid-induced respiratory depresssion). In case of emergency (overdose), spray once into each nostril. If no response within 3 minutes, repeat application and call 911. 08/04/23 08/03/24  Tanya Glisson, MD  Nebulizers (EASY AIR COMPRESSOR NEBULIZER) MISC Use to inhaler nebulizer treatments at needed per instructions on nebulizer prescription 07/21/22   [name], Jolene T, NP  ondansetron  (ZOFRAN ) 4 MG tablet Take 1 tablet (4 mg total) by mouth daily as needed for nausea or vomiting. 03/14/23 03/13/24  Cyrena Mylar, MD  OXYGEN  Inhale 2 L into the lungs at bedtime.    [provider]  PAIN MANAGEMENT INTRATHECAL, IT, PUMP 1 each by Intrathecal  route. Intrathecal (IT) medication:  Morphine  5.0 mg/ml, Bupivacaine 20.0 mg/ml,  Clonidine  100.0 mcg/ml Patient does not remember current. Adjusted every 3 months at [facility]    [provider]  pantoprazole  (PROTONIX ) 40 MG tablet Take 1 tablet by mouth once daily 05/26/23   [name], Jolene T, NP  TRESIBA  FLEXTOUCH 100 UNIT/ML FlexTouch Pen INJECT 55 UNITS SUBCUTANEOUSLY AT BEDTIME 06/15/23   [name], Jolene T, NP  TRULICITY  4.5 MG/0.5ML SOPN INJECT 4.5MG  INTO THE SKIN ONCE WEEKLY AS DIRECTED 05/01/23   [name], Jolene T, NP  venlafaxine  XR (EFFEXOR -XR) 150 MG 24 hr capsule Take 1 capsule by mouth once daily 07/28/23   [name], Jolene T, NP     Critical care time: 48 minutes    [name], MD [facility] 08/10/2023 5:56 PM

## 2023-08-10 NOTE — Consult Note (Signed)
 PHARMACY CONSULT NOTE - ELECTROLYTES  Pharmacy Consult for Electrolyte Monitoring and Replacement   Recent Labs: Height: 4' 10.27 (148 cm) Weight: 68 kg (149 lb 14.6 oz) IBW/kg (Calculated) : 41.52 Estimated Creatinine Clearance: 38.9 mL/min (A) (by C-G formula based on SCr of 1.06 mg/dL (H)). Potassium (mmol/L)  Date Value  08/10/2023 4.4  09/14/2013 3.5   Magnesium  (mg/dL)  Date Value  98/93/7974 1.3 (L)   Calcium  (mg/dL)  Date Value  98/93/7974 7.1 (L)   Calcium , Total (mg/dL)  Date Value  97/88/7984 9.0   Albumin (g/dL)  Date Value  98/93/7974 2.4 (L)  06/17/2023 4.2  09/14/2013 3.2 (L)   Phosphorus (mg/dL)  Date Value  97/90/7976 2.6   Sodium (mmol/L)  Date Value  08/10/2023 138  06/17/2023 138  09/14/2013 133 (L)    Assessment  Jordan Chan is a 73 y.o. female presenting with acute respiratory distress. PMH significant for paroxysmal atrial fibrillation, stress-induced cardiomyopathy. Pharmacy has been consulted to monitor and replace electrolytes.  Diet: NPO Medications: insulin  gtt  Goal of Therapy: Electrolytes WNL  Plan:  1/6:  Mag @ 0304 = 1.3 - Will order Mag Sulfate 4 gm IV X 1.  Patient is currently on insulin  gtt, will check BMP q4h Check all electrolytes with AM labs  Thank you for allowing pharmacy to be a part of this patient's care.  Jordan Chan D 08/10/2023 3:44 AM

## 2023-08-10 NOTE — Telephone Encounter (Signed)
 Appointment scheduled.

## 2023-08-10 NOTE — Progress Notes (Signed)
 2045 - Pt with increased agitation, flailing arms and legs and fighting ventilator. Increased HR, BP and RR. Pt not redirectable. Versed  2mg  IV x1 ordered and administered with positive results.   2049 - Pt resting comfortably in bed and no longer fighting the ventilator. Vital signs improving. Family remains at bedside.  2130 - Pt starting to have increased agitation and noted to be flailing extremities and increase in vital signs. Provider notified and reviewing chart. Unable to redirect patient at this time. Family remains at bedside.   2200 - Restarted low dose Propofol , home PO Gabapentin , and trial Valium  x1 dose (see orders). NP updating daughter.   0125 - Increased agitation noted. PRN Fentanyl  bolus and increase in Propofol  per EMAR. Pt relaxed with appropriate RASS score.   0210 - Pt with increased agitation. Flailing arms, fighting ventilator, hypertensive, tachycardic, and tachypnea noted. Provider notified. Valium  5mg  IV x1 ordered and administered. Vital signs improving, pt appears to be resting comfortably in bed and tolerating ventilator appropriately.   0630 - Pt resting comfortably the remainder of the shift. Able to titrate Propofol . Family remains at bedside. Bed in lowest position.

## 2023-08-10 NOTE — Plan of Care (Signed)
  Problem: Clinical Measurements: Goal: Ability to maintain clinical measurements within normal limits will improve Outcome: Progressing Goal: Diagnostic test results will improve Outcome: Progressing   Problem: Coping: Goal: Level of anxiety will decrease Outcome: Progressing   Problem: Elimination: Goal: Will not experience complications related to bowel motility Outcome: Progressing Goal: Will not experience complications related to urinary retention Outcome: Progressing   Problem: Pain Management: Goal: General experience of comfort will improve Outcome: Progressing   Problem: Safety: Goal: Ability to remain free from injury will improve Outcome: Progressing   Problem: Skin Integrity: Goal: Risk for impaired skin integrity will decrease Outcome: Progressing   Problem: Education: Goal: Ability to describe self-care measures that may prevent or decrease complications (Diabetes Survival Skills Education) will improve Outcome: Progressing Goal: Individualized Educational Video(s) Outcome: Progressing   Problem: Coping: Goal: Ability to adjust to condition or change in health will improve Outcome: Progressing   Problem: Fluid Volume: Goal: Ability to maintain a balanced intake and output will improve Outcome: Progressing   Problem: Health Behavior/Discharge Planning: Goal: Ability to identify and utilize available resources and services will improve Outcome: Progressing Goal: Ability to manage health-related needs will improve Outcome: Progressing   Problem: Metabolic: Goal: Ability to maintain appropriate glucose levels will improve Outcome: Progressing   Problem: Nutritional: Goal: Maintenance of adequate nutrition will improve Outcome: Progressing Goal: Progress toward achieving an optimal weight will improve Outcome: Progressing   Problem: Skin Integrity: Goal: Risk for impaired skin integrity will decrease Outcome: Progressing   Problem: Tissue  Perfusion: Goal: Adequacy of tissue perfusion will improve Outcome: Progressing   Problem: Education: Goal: Ability to describe self-care measures that may prevent or decrease complications (Diabetes Survival Skills Education) will improve Outcome: Progressing Goal: Individualized Educational Video(s) Outcome: Progressing   Problem: Cardiac: Goal: Ability to maintain an adequate cardiac output will improve Outcome: Progressing   Problem: Health Behavior/Discharge Planning: Goal: Ability to identify and utilize available resources and services will improve Outcome: Progressing Goal: Ability to manage health-related needs will improve Outcome: Progressing   Problem: Fluid Volume: Goal: Ability to achieve a balanced intake and output will improve Outcome: Progressing   Problem: Metabolic: Goal: Ability to maintain appropriate glucose levels will improve Outcome: Progressing   Problem: Nutritional: Goal: Maintenance of adequate nutrition will improve Outcome: Progressing Goal: Maintenance of adequate weight for body size and type will improve Outcome: Progressing   Problem: Respiratory: Goal: Will regain and/or maintain adequate ventilation Outcome: Progressing   Problem: Urinary Elimination: Goal: Ability to achieve and maintain adequate renal perfusion and functioning will improve Outcome: Progressing   Problem: Education: Goal: Knowledge of General Education information will improve Description: Including pain rating scale, medication(s)/side effects and non-pharmacologic comfort measures Outcome: Not Progressing   Problem: Health Behavior/Discharge Planning: Goal: Ability to manage health-related needs will improve Outcome: Not Progressing   Problem: Clinical Measurements: Goal: Will remain free from infection Outcome: Not Progressing Goal: Respiratory complications will improve Outcome: Not Progressing Goal: Cardiovascular complication will be  avoided Outcome: Not Progressing   Problem: Activity: Goal: Risk for activity intolerance will decrease Outcome: Not Progressing   Problem: Nutrition: Goal: Adequate nutrition will be maintained Outcome: Not Progressing

## 2023-08-10 NOTE — Procedures (Signed)
 Arterial Catheter Insertion Procedure Note  SADONNA KOTARA  [id number]  October 02, 1950  Date:08/10/23  Time:5:20 AM   Provider Performing: Almarie DELENA Nose   Procedure: Insertion of Arterial Line (63379) with US  guidance (23062)   Indication(s) Blood pressure monitoring and/or need for frequent ABGs  Consent Risks of the procedure as well as the alternatives and risks of each were explained to the patient and/or caregiver.  Consent for the procedure was obtained and is signed in the bedside chart  Anesthesia None   Time Out Verified patient identification, verified procedure, site/side was marked, verified correct patient position, special equipment/implants available, medications/allergies/relevant history reviewed, required imaging and test results available.  Sterile Technique Maximal sterile technique including full sterile barrier drape, hand hygiene, sterile gown, sterile gloves, mask, hair covering, sterile ultrasound probe cover (if used).  Procedure Description Area of catheter insertion was cleaned with chlorhexidine  and draped in sterile fashion. With real-time ultrasound guidance an arterial catheter was placed into the left femoral artery.  Appropriate arterial tracings confirmed on monitor.    Complications/Tolerance None; patient tolerated the procedure well.  EBL Minimal  Specimen(s) None   Almarie Nose, DNP, CCRN, FNP-C, AGACNP-BC Acute Care & Family Nurse Practitioner  [facility]  See Amion for personal pager PCCM on call pager [phone] until 7 am

## 2023-08-10 NOTE — Progress Notes (Signed)
 Initial Nutrition Assessment  DOCUMENTATION CODES:   Obesity unspecified  INTERVENTION:   -If aggressive care is warranted, TF via OGT:   Initiate Vital High Protein @ 20 ml/hr and increase by 10 ml every 4 hours to goal rate of 40 ml/hr.   60 ml Prosource TF daily  30 ml free water  flushe veyr 4 hours  Tube feeding regimen provides 1040 kcal (100% of needs), 104 grams of protein, and 803 ml of H2O. Total free water : 983  NUTRITION DIAGNOSIS:   Inadequate oral intake related to inability to eat as evidenced by NPO status.  GOAL:   Provide needs based on ASPEN/SCCM guidelines  MONITOR:   Vent status  REASON FOR ASSESSMENT:   Ventilator    ASSESSMENT:   Pt with significant PMH of B12 deficiency, chronic pain syndrome s/p intrathecal pump, paroxysmal atrial fibrillation, stress-induced cardiomyopathy in the setting of pain pump malfunction and opioid withdrawal, PE following hospitalization for pneumonia, DM, COPD, GERD, OSA, and IBS who presented with chief complaints of acute respiratory distress  Pt admitted with respiratory failure secondary to pneumonia, COPD exacerbation, pulmonary edema, and sepsis.   1/5- intubated, NGT placed  1/6- insulin  drip d/c  Patient is currently intubated on ventilator support. OGT in placed, currently connected to low, intermittent suction. KUB on 08/09/23 reveals tips of tube in stomach.  MV: 9.5 L/min Temp (24hrs), Avg:100.9 F (38.3 C), Min:99.9 F (37.7 C), Max:102.2 F (39 C)  MAP: 59  Reviewed I/O's: +3.2 L x 24 hours and +5.5 L since admission  UOP: 1.9 L x 24 hours  NGT output: 150 ml x 24 hours  Per PCCM notes, pt is severely critically ill; pt with high chance of hospital death. Pt is DNR.   Propofol  currently d/c, but RN considering adding back secondary to agitation.   Attempted to examine pt multiple times today, however, pt either too agitated to complete exam or in with other members of team at times of  attempted visits.   Reviewed wt hx; no wt loss noted over the past 5 months.   Medications reviewed and include protonix , precedex , levophed , and pitressin  Lab Results  Component Value Date   HGBA1C 7.4 (H) 06/12/2023   PTA DM medications are 55 units tresiba  at bedtime, 1000 mg metformin  BID, and 5 units humalog  TID with meals.   Labs reviewed: CBGS: 105-128 (inpatient orders for glycemic control are 0-20 units insulin  aspart every 4 hours).    Diet Order:   Diet Order             Diet NPO time specified  Diet effective now                   EDUCATION NEEDS:   Not appropriate for education at this time  Skin:  Skin Assessment: Reviewed RN Assessment  Last BM:  Unknown  Height:   Ht Readings from Last 1 Encounters:  08/09/23 4' 10.27 (1.48 m)    Weight:   Wt Readings from Last 1 Encounters:  08/10/23 72.8 kg    Ideal Body Weight:  43.9 kg  BMI:  Body mass index is 33.24 kg/m.  Estimated Nutritional Needs:   Kcal:  [phone]  Protein:  >88 grams  Fluid:  > 1 L    Margery ORN, RD, LDN, CDCES Registered Dietitian III Certified Diabetes Care and Education Specialist If unable to reach this RD, please use RD Inpatient group chat on secure chat between hours of 8am-4 pm daily

## 2023-08-10 NOTE — Progress Notes (Signed)
 PHARMACY - ANTICOAGULATION CONSULT NOTE  Pharmacy Consult for Heparin   Indication: atrial fibrillation and pulmonary embolus  Allergies  Allergen Reactions   Other Palpitations    IV steroids   Pain Patch [Menthol] Anaphylaxis   Avelox  [Moxifloxacin  Hcl In Nacl] Other (See Comments)    Upset stomach   Doxycycline  Diarrhea   Erythromycin  Nausea Only and Other (See Comments)    Can take a Z-Pak just fine Other reaction(s): Other (See Comments) Can take Z-Pak  Can take a Z-Pak just fine   Fentanyl  Nausea Only and Rash   Moxifloxacin  Hcl Other (See Comments)    Upset stomach Upset stomach   Oxycontin [Oxycodone] Hives   Ozempic [Semaglutide] Nausea Only    Patient Measurements: Height: 4' 10.27 (148 cm) Weight: 72.8 kg (160 lb 7.9 oz) IBW/kg (Calculated) : 41.52 Heparin  Dosing Weight: 56.7 kg   Vital Signs: Temp: 101.1 F (38.4 C) (01/06 0530) Temp Source: Bladder (01/06 0400) BP: 100/45 (01/06 0400) Pulse Rate: 85 (01/06 0530)  Labs: Recent Labs    08/09/23 0214 08/09/23 0456 08/09/23 0744 08/09/23 0954 08/09/23 1741 08/09/23 2113 08/10/23 0304  HGB 11.6* 10.7*  --   --   --   --  8.5*  HCT 41.6 35.8*  --   --   --   --  27.5*  PLT 530* 453*  --   --   --   --  379  APTT 38*  --   --   --  55*  --  194*  LABPROT 17.3*  --   --   --   --   --   --   INR 1.4*  --   --   --   --   --   --   HEPARINUNFRC  --   --  >1.10*  --   --   --  >1.10*  CREATININE 1.23* 1.13*  --    < > 1.16* 1.13* 1.06*  TROPONINIHS 20* 95*  --   --   --   --   --    < > = values in this interval not displayed.    Estimated Creatinine Clearance: 40.3 mL/min (A) (by C-G formula based on SCr of 1.06 mg/dL (H)).   Medical History: Past Medical History:  Diagnosis Date   Arthritis    Asthma    Atrial fibrillation (HCC)    Breast cancer (HCC) 1998   right breast ca with mastectomy and chemotherapy and radiation   Bronchitis    CHF (congestive heart failure) (HCC)    with  Morphine  withdrawal   COPD (chronic obstructive pulmonary disease) (HCC)    Diabetes mellitus without complication (HCC)    Diverticulitis    diverticulosis also   Dyspnea    Endometriosis    GERD (gastroesophageal reflux disease)    History of shingles 2000-2005   Hypercholesteremia    Hypertension    IBS (irritable bowel syndrome)    Low back pain    a. Implanted morphine /bupivicaine/clonidine  pump.   Neuropathy    Orthopnea    Oxygen  dependent    uses at night   Personal history of chemotherapy    Personal history of radiation therapy    Pneumonia    pneumonia 5-6 times, history of bronchitis also   Scoliosis    Sleep apnea    does not use cpap   Stroke (HCC) 2010   TIA, 10 years ago   Withdrawal from sedative drug (HCC)    withdrawal  from morphine  when pump batteries died    Medications:  Medications Prior to Admission  Medication Sig Dispense Refill Last Dose/Taking   ACCU-CHEK AVIVA PLUS test strip TEST THREE TIMES DAILY 300 strip 3    Accu-Chek Softclix Lancets lancets TEST BLOOD SUGAR THREE TIMES DAILY 300 each 3    albuterol  (PROVENTIL  HFA;VENTOLIN  HFA) 108 (90 BASE) MCG/ACT inhaler Inhale 2 puffs into the lungs every 6 (six) hours as needed for wheezing or shortness of breath.       albuterol  (PROVENTIL ) (2.5 MG/3ML) 0.083% nebulizer solution Take 3 mLs (2.5 mg total) by nebulization every 4 (four) hours as needed for wheezing or shortness of breath. 75 mL 2    Alcohol  Swabs (DROPSAFE ALCOHOL  PREP) 70 % PADS USE TWICE DAILY  WITH  SUGAR  CHECKS 200 each 2    apixaban  (ELIQUIS ) 5 MG TABS tablet Take 1 tablet (5 mg total) by mouth 2 (two) times daily. 180 tablet 4    atorvastatin  (LIPITOR) 40 MG tablet Take 1 tablet by mouth once daily 90 tablet 0    Blood Glucose Monitoring Suppl (TRUE METRIX METER) w/Device KIT Use to check blood sugar 4 times a day 1 kit 4    busPIRone  (BUSPAR ) 5 MG tablet Take 1 tablet by mouth twice daily 60 tablet 11    cholecalciferol   (VITAMIN D3) 25 MCG (1000 UNIT) tablet Take 1,000 Units by mouth daily.      Continuous Glucose Receiver (FREESTYLE LIBRE 2 READER) DEVI To check blood sugars 3-4 times daily due to insulin  use. DX E11.40 2 each 5    Continuous Glucose Sensor (FREESTYLE LIBRE 2 SENSOR) MISC To check blood sugars 3-4 times daily due to insulin  use. DX E11.40 2 each 5    Cyanocobalamin  1000 MCG/ML KIT Inject 1,000 mcg as directed every 30 (thirty) days.      cyclobenzaprine  (FLEXERIL ) 5 MG tablet Take 5 mg by mouth 2 (two) times daily as needed for muscle spasms. Takes very rarely      Fluticasone -Umeclidin-Vilant (TRELEGY ELLIPTA ) 100-62.5-25 MCG/ACT AEPB INHALE 1 PUFF INTO THE LUNGS DAILY -42 DAY EXPIRATION AFTER OPENING FOIL TRAY 180 each 2    folic acid  (FOLVITE ) 1 MG tablet TAKE 1 TABLET EVERY DAY 90 tablet 0    furosemide  (LASIX ) 40 MG tablet Take 0.5 tablets (20 mg total) by mouth daily. 90 tablet 4    gabapentin  (NEURONTIN ) 800 MG tablet Take 1 tablet (800 mg total) by mouth 3 (three) times daily. 270 tablet 4    HYDROcodone -acetaminophen  (NORCO) 10-325 MG tablet Take 1 tablet by mouth every 4 (four) hours as needed.      insulin  lispro (HUMALOG  KWIKPEN) 100 UNIT/ML KwikPen Inject 5 Units into the skin 3 (three) times daily before meals. Do not inject insulin  if you are not going to eat meal.  Only take before a meal. 15 mL 3    losartan  (COZAAR ) 25 MG tablet Take 0.5 tablets (12.5 mg total) by mouth daily. 45 tablet 4    metFORMIN  (GLUCOPHAGE ) 1000 MG tablet Take 1 tablet (1,000 mg total) by mouth 2 (two) times daily with a meal. 180 tablet 4    metoprolol  succinate (TOPROL -XL) 25 MG 24 hr tablet Take 0.5 tablets (12.5 mg total) by mouth daily. 45 tablet 4    montelukast  (SINGULAIR ) 10 MG tablet Take 1 tablet (10 mg total) by mouth at bedtime. 90 tablet 4    naloxone  (NARCAN ) nasal spray 4 mg/0.1 mL Place 1 spray into the nose  as needed for up to 365 doses (for opioid-induced respiratory depresssion). In case  of emergency (overdose), spray once into each nostril. If no response within 3 minutes, repeat application and call 911. 1 each 1    Nebulizers (EASY AIR COMPRESSOR NEBULIZER) MISC Use to inhaler nebulizer treatments at needed per instructions on nebulizer prescription 1 each 0    ondansetron  (ZOFRAN ) 4 MG tablet Take 1 tablet (4 mg total) by mouth daily as needed for nausea or vomiting. 30 tablet 1    OXYGEN  Inhale 2 L into the lungs at bedtime.      PAIN MANAGEMENT INTRATHECAL, IT, PUMP 1 each by Intrathecal route. Intrathecal (IT) medication:  Morphine  5.0 mg/ml, Bupivacaine 20.0 mg/ml,  Clonidine  100.0 mcg/ml Patient does not remember current. Adjusted every 3 months at [facility]      pantoprazole  (PROTONIX ) 40 MG tablet Take 1 tablet by mouth once daily 90 tablet 0    TRESIBA  FLEXTOUCH 100 UNIT/ML FlexTouch Pen INJECT 55 UNITS SUBCUTANEOUSLY AT BEDTIME 30 mL 0    TRULICITY  4.5 MG/0.5ML SOPN INJECT 4.5MG  INTO THE SKIN ONCE WEEKLY AS DIRECTED 12 mL 0    venlafaxine  XR (EFFEXOR -XR) 150 MG 24 hr capsule Take 1 capsule by mouth once daily 90 capsule 0     Assessment: Pharmacy consulted to dose heparin  in this 73 year old female admitted with respiratory failure, possible PE.   Pt was on Eliquis  5 mg PO BID PTA for Afib.   Unsure of last dose.  CrCl = 36.5 ml/min   1/5 1741 aPTT 55 1/6 0304 aPTT 194,  HL >1.10  Goal of Therapy:  aPTT : 66 - 102  Heparin  level 0.3-0.7 units/ml Monitor platelets by anticoagulation protocol: Yes   Plan:  1/6 @ 0304:  aPTT = 194,  HL = > 1.10 - aPTT greatly elevated,  HL elevated from PTA Eliquis  - RN states sample was drawn from arterial line and heparin  is infusing through peripheral line so will take this as valid - will hold heparin  infusion for 1 hr and restart @ 950 units/hr - will recheck aPTT 8 hrs after restart - will recheck HL on 1/7 with AM labs   Lalo Tromp D 08/10/2023 6:05 AM

## 2023-08-10 NOTE — Plan of Care (Signed)
  Problem: Coping: Goal: Level of anxiety will decrease Outcome: Not Progressing   

## 2023-08-11 ENCOUNTER — Inpatient Hospital Stay: Payer: Medicare HMO

## 2023-08-11 ENCOUNTER — Inpatient Hospital Stay (HOSPITAL_COMMUNITY)
Admit: 2023-08-11 | Discharge: 2023-08-11 | Disposition: A | Discharge status: Home / Self Care | Payer: Medicare HMO | Attending: Student in an Organized Health Care Education/Training Program

## 2023-08-11 DIAGNOSIS — R0609 Other forms of dyspnea: Secondary | ICD-10-CM

## 2023-08-11 DIAGNOSIS — I2694 Multiple subsegmental pulmonary emboli without acute cor pulmonale: Secondary | ICD-10-CM | POA: Diagnosis not present

## 2023-08-11 DIAGNOSIS — J9621 Acute and chronic respiratory failure with hypoxia: Secondary | ICD-10-CM | POA: Diagnosis not present

## 2023-08-11 DIAGNOSIS — J9602 Acute respiratory failure with hypercapnia: Secondary | ICD-10-CM | POA: Diagnosis not present

## 2023-08-11 DIAGNOSIS — J9622 Acute and chronic respiratory failure with hypercapnia: Secondary | ICD-10-CM | POA: Diagnosis not present

## 2023-08-11 DIAGNOSIS — J189 Pneumonia, unspecified organism: Secondary | ICD-10-CM

## 2023-08-11 DIAGNOSIS — I2699 Other pulmonary embolism without acute cor pulmonale: Secondary | ICD-10-CM

## 2023-08-11 DIAGNOSIS — A419 Sepsis, unspecified organism: Principal | ICD-10-CM

## 2023-08-11 HISTORY — DX: Other pulmonary embolism without acute cor pulmonale: I26.99

## 2023-08-11 LAB — CBC WITH DIFFERENTIAL/PLATELET
Abs Immature Granulocytes: 0.23 10*3/uL — ABNORMAL HIGH (ref 0.00–0.07)
Basophils Absolute: 0.1 10*3/uL (ref 0.0–0.1)
Basophils Relative: 0 %
Eosinophils Absolute: 0.2 10*3/uL (ref 0.0–0.5)
Eosinophils Relative: 1 %
HCT: 30.4 % — ABNORMAL LOW (ref 36.0–46.0)
Hemoglobin: 9.8 g/dL — ABNORMAL LOW (ref 12.0–15.0)
Immature Granulocytes: 1 %
Lymphocytes Relative: 8 %
Lymphs Abs: 2.2 10*3/uL (ref 0.7–4.0)
MCH: 28.2 pg (ref 26.0–34.0)
MCHC: 32.2 g/dL (ref 30.0–36.0)
MCV: 87.6 fL (ref 80.0–100.0)
Monocytes Absolute: 1.7 10*3/uL — ABNORMAL HIGH (ref 0.1–1.0)
Monocytes Relative: 6 %
Neutro Abs: 23.9 10*3/uL — ABNORMAL HIGH (ref 1.7–7.7)
Neutrophils Relative %: 84 %
Platelets: 329 10*3/uL (ref 150–400)
RBC: 3.47 MIL/uL — ABNORMAL LOW (ref 3.87–5.11)
RDW: 16.4 % — ABNORMAL HIGH (ref 11.5–15.5)
Smear Review: NORMAL
WBC: 28.4 10*3/uL — ABNORMAL HIGH (ref 4.0–10.5)
nRBC: 0 % (ref 0.0–0.2)

## 2023-08-11 LAB — BLOOD GAS, ARTERIAL
Acid-base deficit: 2 mmol/L (ref 0.0–2.0)
Acid-base deficit: 0.4 mmol/L (ref 0.0–2.0)
Bicarbonate: 23.1 mmol/L (ref 20.0–28.0)
Bicarbonate: 24.8 mmol/L (ref 20.0–28.0)
FIO2: 75 %
FIO2: 75 %
MECHVT: 400 mL
Mechanical Rate: 24
O2 Content: 60 L/min
O2 Saturation: 99.4 %
O2 Saturation: 99.4 %
PEEP: 10 cmH2O
Patient temperature: 37
Patient temperature: 37
pCO2 arterial: 40 mm[Hg] (ref 32–48)
pCO2 arterial: 42 mm[Hg] (ref 32–48)
pH, Arterial: 7.37 (ref 7.35–7.45)
pH, Arterial: 7.38 (ref 7.35–7.45)
pO2, Arterial: 102 mm[Hg] (ref 83–108)
pO2, Arterial: 190 mm[Hg] — ABNORMAL HIGH (ref 83–108)

## 2023-08-11 LAB — COMPREHENSIVE METABOLIC PANEL
ALT: 37 U/L (ref 0–44)
AST: 23 U/L (ref 15–41)
Albumin: 2.3 g/dL — ABNORMAL LOW (ref 3.5–5.0)
Alkaline Phosphatase: 71 U/L (ref 38–126)
Anion gap: 8 (ref 5–15)
BUN: 19 mg/dL (ref 8–23)
CO2: 24 mmol/L (ref 22–32)
Calcium: 7.5 mg/dL — ABNORMAL LOW (ref 8.9–10.3)
Chloride: 102 mmol/L (ref 98–111)
Creatinine, Ser: 0.98 mg/dL (ref 0.44–1.00)
GFR, Estimated: >60 mL/min (ref >60)
Glucose, Bld: 113 mg/dL — ABNORMAL HIGH (ref 70–99)
Potassium: 4 mmol/L (ref 3.5–5.1)
Sodium: 134 mmol/L — ABNORMAL LOW (ref 135–145)
Total Bilirubin: 0.4 mg/dL (ref 0.0–1.2)
Total Protein: 5.6 g/dL — ABNORMAL LOW (ref 6.5–8.1)

## 2023-08-11 LAB — ECHOCARDIOGRAM COMPLETE
AR max vel: 2.2 cm2
AV Area VTI: 2.45 cm2
AV Area mean vel: 2.37 cm2
AV Mean grad: 2 mm[Hg]
AV Peak grad: 6.7 mm[Hg]
Ao pk vel: 1.29 m/s
Area-P 1/2: 8.43 cm2
Height: 58.268 in
MV VTI: 2.05 cm2
S' Lateral: 3.7 cm
Weight: 1499.13 [oz_av]

## 2023-08-11 LAB — CBC
HCT: 26.4 % — ABNORMAL LOW (ref 36.0–46.0)
Hemoglobin: 8.3 g/dL — ABNORMAL LOW (ref 12.0–15.0)
MCH: 28.3 pg (ref 26.0–34.0)
MCHC: 31.4 g/dL (ref 30.0–36.0)
MCV: 90.1 fL (ref 80.0–100.0)
Platelets: 310 10*3/uL (ref 150–400)
RBC: 2.93 MIL/uL — ABNORMAL LOW (ref 3.87–5.11)
RDW: 16.4 % — ABNORMAL HIGH (ref 11.5–15.5)
WBC: 24.1 10*3/uL — ABNORMAL HIGH (ref 4.0–10.5)
nRBC: 0 % (ref 0.0–0.2)

## 2023-08-11 LAB — GLUCOSE, CAPILLARY
Glucose-Capillary: 102 mg/dL — ABNORMAL HIGH (ref 70–99)
Glucose-Capillary: 97 mg/dL (ref 70–99)
Glucose-Capillary: 150 mg/dL — ABNORMAL HIGH (ref 70–99)
Glucose-Capillary: 147 mg/dL — ABNORMAL HIGH (ref 70–99)
Glucose-Capillary: 186 mg/dL — ABNORMAL HIGH (ref 70–99)
Glucose-Capillary: 222 mg/dL — ABNORMAL HIGH (ref 70–99)

## 2023-08-11 LAB — APTT
aPTT: 93 s — ABNORMAL HIGH (ref 24–36)
aPTT: 66 s — ABNORMAL HIGH (ref 24–36)
aPTT: 82 s — ABNORMAL HIGH (ref 24–36)

## 2023-08-11 LAB — LEGIONELLA PNEUMOPHILA SEROGP 1 UR AG: L. pneumophila Serogp 1 Ur Ag: NEGATIVE

## 2023-08-11 LAB — TRIGLYCERIDES: Triglycerides: 60 mg/dL (ref <150)

## 2023-08-11 LAB — PHOSPHORUS: Phosphorus: 3.3 mg/dL (ref 2.5–4.6)

## 2023-08-11 LAB — HEPARIN LEVEL (UNFRACTIONATED): Heparin Unfractionated: 0.85 [IU]/mL — ABNORMAL HIGH (ref 0.30–0.70)

## 2023-08-11 LAB — PROCALCITONIN: Procalcitonin: 3.1 ng/mL

## 2023-08-11 LAB — MAGNESIUM: Magnesium: 2.1 mg/dL (ref 1.7–2.4)

## 2023-08-11 MED ORDER — DIAZEPAM 5 MG/ML IJ SOLN
INTRAMUSCULAR | Status: AC
  Administered 2023-08-11: 5 mg via INTRAVENOUS
  Filled 2023-08-11: qty 2

## 2023-08-11 MED ORDER — LABETALOL HCL 5 MG/ML IV SOLN
10.0000 mg | INTRAVENOUS | Status: DC | PRN
  Administered 2023-08-11: 10 mg via INTRAVENOUS
  Filled 2023-08-11: qty 4

## 2023-08-11 MED ORDER — SODIUM CHLORIDE 0.9 % IV SOLN
2.0000 g | Freq: Two times a day (BID) | INTRAVENOUS | Status: DC
  Administered 2023-08-11 – 2023-08-15 (×8): 2 g via INTRAVENOUS
  Filled 2023-08-11 (×9): qty 12.5

## 2023-08-11 MED ORDER — FUROSEMIDE 10 MG/ML IJ SOLN
40.0000 mg | Freq: Once | INTRAMUSCULAR | Status: AC
  Administered 2023-08-11: 40 mg via INTRAVENOUS
  Filled 2023-08-11: qty 4

## 2023-08-11 MED ORDER — FREE WATER
30.0000 mL | Status: DC
  Administered 2023-08-11 – 2023-08-15 (×22): 30 mL

## 2023-08-11 MED ORDER — ETOMIDATE 2 MG/ML IV SOLN
INTRAVENOUS | Status: AC
  Filled 2023-08-11: qty 10

## 2023-08-11 MED ORDER — DIAZEPAM 5 MG/ML IJ SOLN
2.5000 mg | INTRAMUSCULAR | Status: DC | PRN
  Administered 2023-08-11: 2.5 mg via INTRAVENOUS
  Filled 2023-08-11: qty 2

## 2023-08-11 MED ORDER — SUCCINYLCHOLINE CHLORIDE 200 MG/10ML IV SOSY
1.5000 mg/kg | PREFILLED_SYRINGE | Freq: Once | INTRAVENOUS | Status: AC
  Administered 2023-08-11: 63.8 mg via INTRAVENOUS
  Filled 2023-08-11: qty 10

## 2023-08-11 MED ORDER — DIAZEPAM 5 MG/ML IJ SOLN: 5.0000 mg | Freq: Once | INTRAMUSCULAR | Status: AC

## 2023-08-11 MED ORDER — VITAL AF 1.2 CAL PO LIQD
1000.0000 mL | ORAL | Status: DC
  Administered 2023-08-11 – 2023-08-14 (×4): 1000 mL

## 2023-08-11 MED ORDER — ETOMIDATE 2 MG/ML IV SOLN
20.0000 mg | Freq: Once | INTRAVENOUS | Status: AC
  Administered 2023-08-11: 20 mg via INTRAVENOUS

## 2023-08-11 MED ORDER — ACETAMINOPHEN 10 MG/ML IV SOLN
1000.0000 mg | Freq: Once | INTRAVENOUS | Status: AC
  Filled 2023-08-11: qty 100

## 2023-08-11 MED ORDER — HYDROCORTISONE SOD SUC (PF) 100 MG IJ SOLR
50.0000 mg | Freq: Four times a day (QID) | INTRAMUSCULAR | Status: DC
  Administered 2023-08-11 – 2023-08-15 (×16): 50 mg via INTRAVENOUS
  Filled 2023-08-11 (×16): qty 2

## 2023-08-11 NOTE — Progress Notes (Signed)
 NAME:  Jordan Chan, MRN:  [id number], DOB:  1950/10/31, LOS: 2 ADMISSION DATE:  08/09/2023, CONSULTATION DATE:  08/09/2023 CHIEF COMPLAINT:  Acute Hypoxic Respiratory Failure   Brief Pt Description / Synopsis:  73 y.o. female admitted with Acute Metabolic Encephalopathy and Acute Hypoxic Respiratory Failure in the setting of Acute Decompensated CHF, Community Acquired Pneumonia, and small bilateral subsegmental PE's requiring intubation and mechanical ventilation.   History of Present Illness:  73 y.o female with significant PMH of B12 deficiency, chronic pain syndrome s/p intrathecal pump, paroxysmal atrial fibrillation, stress-induced cardiomyopathy in the setting of pain pump malfunction and opioid withdrawal, PE following hospitalization for pneumonia, DM, COPD, GERD, OSA, and IBS who presented to the ED with chief complaints of acute respiratory distress found with sats in the 50 to 60% on RA per EMS.   ED Course: Initial vital signs showed HR of 142 beats/minute, BP mm Hg, the RR 23 breaths/minute, and the oxygen  saturation 100% on BVM and a temperature of 92.20F (33.3C).  Patient was intubated for airway protection. Pertinent Labs/Diagnostics Findings: Na+/ K+: 136/4.2 glucose: 418 BUN/Cr.:  13/1.23 AST/ALT: 97/51, CO2 14, anion gap 20 WBC: 25.2 K/L without bands or neutrophil predominance, Plts: 530 PCT:  Lactic acid: >9.0 COVID PCR: Negative,  troponin: 20  BNP:241.9 ABG: pO2 96; pCO2 51; pH 7.03;  HCO3 13.5, %O2 Sat 96.4.  CXR> see result ED Medications: Patient treated with Duonebs, Solumedrol 125 mg x 1, 30 cc/kg of fluids and started on broad-spectrum antibiotics Vanco cefepime  and Flagyl  for suspected sepsis  Please see Significant Hospital Events section below for full detailed hospital course.   Pertinent  Medical History  B12 deficiency, chronic pain syndrome s/p intrathecal pump, paroxysmal atrial fibrillation, stress-induced cardiomyopathy in the setting of pain  pump malfunction and opioid withdrawal, PE following hospitalization for pneumonia, DM, COPD, GERD, OSA, and IBS   Micro Data:  1/5: COVID-19/FLU/RSV PCR>>negative 1/5: Respiratory Viral Panel>>negative 1/5: Blood cultures x2>> no growth to date 1/5: MRSA PCR>>negative 1/5: Tracheal aspirate>> unable to collect 1/5: Strep pneumo urinary antigen>> negative 1/5: Legionella urinary antigen>> 1/7: Mycoplasma pneumonia IgM>>  Antimicrobials:   Anti-infectives (From admission, onward)    Start     Dose/Rate Route Frequency Ordered Stop   08/11/23 0500  vancomycin  (VANCOCIN ) IVPB 1000 mg/200 mL premix  Status:  Discontinued        1,000 mg 200 mL/hr over 60 Minutes Intravenous Every 48 hours 08/09/23 0506 08/09/23 1051   08/09/23 1500  ceFEPIme  (MAXIPIME ) 2 g in sodium chloride  0.9 % 100 mL IVPB  Status:  Discontinued        2 g 200 mL/hr over 30 Minutes Intravenous Every 12 hours 08/09/23 0503 08/09/23 1051   08/09/23 1200  cefTRIAXone  (ROCEPHIN ) 2 g in sodium chloride  0.9 % 100 mL IVPB        2 g 200 mL/hr over 30 Minutes Intravenous Every 24 hours 08/09/23 1051     08/09/23 1200  azithromycin  (ZITHROMAX ) 500 mg in sodium chloride  0.9 % 250 mL IVPB        500 mg 250 mL/hr over 60 Minutes Intravenous Every 24 hours 08/09/23 1051     08/09/23 0300  ceFEPIme  (MAXIPIME ) 2 g in sodium chloride  0.9 % 100 mL IVPB        2 g 200 mL/hr over 30 Minutes Intravenous  Once 08/09/23 0247 08/09/23 0404   08/09/23 0300  vancomycin  (VANCOREADY) IVPB 1500 mg/300 mL  1,500 mg 150 mL/hr over 120 Minutes Intravenous  Once 08/09/23 0247 08/09/23 0708       Significant Hospital Events: Including procedures, antibiotic start and stop dates in addition to other pertinent events   1/5: Admitted to ICU with acute hypoxic hypercapnic respiratory failure secondary to suspected pneumonia and AECOPD. 1/6: failed SBT, hypertensive off sedation. Failed switch to dexmedetomidine  1/7: On minimal vent  support, plan for SBT as tolerated.  Extubated, but required Reintubation shortly after.  Interim History / Subjective:  -No significant events noted overnight -Febrile, T max 101, Leukocytosis improved to 24 from 26.6 ~ will add on differential and Procalcitonin, check Mycoplasma pneumoniae ~ all other cultures currently with no growth -Pt is awake on Precedex , propofol  and fentanyl  as reduced doses for WUQ ~ very anxious, moving all extremities purposefully  -Diuresed well with Lasix  yesterday, UOP 3.2 L (net +3.7), Creatinine improved to 0.98 from 1.05 ~ will give another 40 mg IV Lasix  x1 dose again today -Bedside US  performed by Dr. [name] showed very small bilateral simple pleural effusions ~ not amendable to Thoracentesis -Placed on PSV 5/5: RR 40, RV 500-600 ~ Rapid shallow breathing index score of 80 ~ discussed with Dr. [name] and pt's daughter at bedside, he would like to give her trial of extubation with understanding that she could be high risk for reintubation given anxiety ~ daughter is in agreement at bedside for extubation trial with reintubation if needed -Post extubated remained anxious with increased work of breathing ~ concern for ability to maintain current respiratory status with eventual impending respiratory arrest ~ will reintubate   Objective   Blood pressure (!) 97/48, pulse 81, temperature (!) 100.4 F (38 C), resp. rate (!) 24, height 4' 10.27 (1.48 m), weight 42.5 kg, SpO2 100%.    Vent Mode: PRVC FiO2 (%):  [35 %-40 %] 40 % Set Rate:  [24 bmp] 24 bmp Vt Set:  [400 mL] 400 mL PEEP:  [8 cmH20] 8 cmH20 Plateau Pressure:  [16 cmH20-21 cmH20] 18 cmH20   Intake/Output Summary (Last 24 hours) at 08/11/2023 0743 Last data filed at 08/11/2023 0545 Gross per 24 hour  Intake 1663.88 ml  Output 3420 ml  Net -1756.12 ml   Filed Weights   08/09/23 0400 08/10/23 0400 08/11/23 0430  Weight: 68 kg 72.8 kg 42.5 kg    Examination: General: Acute on chronically ill  appearing female, intubated, awake and anxious HENT: Atraumatic, normocephalic, no JVD, orally intubated Lungs: Coarse breath sounds throughout, Mild tachypnea on SBT, even Cardiovascular: Tachycardia, regular rhythm, s1s2, no M/R/G Abdomen: Obese, soft, nontender, nondistended, no guarding or rebound tenderness, BS+ x4 Extremities: Normal bulk and tone, no deformities, trace edema BLE, no cyanosis Neuro: Awake and anxious on Precedex , moving all extremities purposefully but not following commands, no focal deficits noted, pupils PERRL GU: Foley catheter in place draining yellow urine  Resolved Hospital Problem list     Assessment & Plan:   #Acute Hypoxic Respiratory Failure in the setting of Acute Decompensated CHF, suspected Community Acquired Pneumonia, and Pulmonary Embolism PMHx: Asthma, COPD, OSA -CTa Chest  on 1/5 with small bilateral subsegmental PE's, severe multilobar bilateral pneumonia (? Aspiration), small bilateral parapneumonic pleural effusions -FAILED TRIAL OF EXTUBATION 1/7 -Full vent support, implement lung protective strategies -Plateau pressures less than 30 cm H20 -Wean FiO2 & PEEP as tolerated to maintain O2 sats >92% -Follow intermittent Chest X-ray & ABG as needed -Spontaneous Breathing Trials when respiratory parameters met and mental status permits -Implement VAP  Bundle -Bronchodilators -ABX as above -Heparin  gtt -Diuresis as BP and renal function permits ~ will give 40 mg IV Lasix  x1 dose on 1/7  #Circulatory Shock, seems to be sedation related #Decompensated HFmrEF #Stress Induced Cardiomyopathy #Paroxsymal Atrial Fibrillation PMHx: HTN, HLD, CHF -Continuous cardiac monitoring -Maintain MAP >65 -Vasopressors as needed to maintain MAP goal -Trend lactic acid until normalized -Trend HS Troponin until peaked -Echocardiogram pending -Diuresis as BP and renal function permits ~ will give 40 mg IV Lasix  x1 dose on 1/7  #Community Acquired  Pneumonia -Monitor fever curve -Trend WBC's & Procalcitonin -Follow cultures as above -Continue empiric Azithromycin  and Ceftriaxone  pending cultures & sensitivities  #Acute Kidney Injury ~ IMPROVED #Mild Hyponatremia -Monitor I&O's / urinary output -Follow BMP -Ensure adequate renal perfusion -Avoid nephrotoxic agents as able -Replace electrolytes as indicated ~ Pharmacy following for assistance with electrolyte replacement  #Diabetes Mellitus -CBG's q4h; Target range of 140 to 180 -SSI -Follow ICU Hypo/Hyperglycemia protocol  #Acute Metabolic Encephalopathy #Chronic Pain  #Sedation needs in the setting of mechanical ventilation MRI Brain negative for acute intracranial process -Maintain a RASS goal of 0 to -1 -Fentanyl  and Propofol  as needed to maintain RASS goal -Utilize Precedex  for WUA and SBT -Avoid sedating medications as able -Daily wake up assessment -Outpatient Gabapentin  resumed at reduced dose          Best Practice (right click and Reselect all SmartList Selections daily)   Diet/type: NPO DVT prophylaxis: systemic heparin  GI prophylaxis: PPI Lines: Central line, Arterial Line, and yes and it is still needed Foley:  Yes, and it is still needed Code Status:  DNR (but may intubate) Last date of multidisciplinary goals of care discussion [1/7]  1/7: Pt's daughter Avelina updated at bedside.  Labs   CBC: Recent Labs  Lab 08/09/23 0214 08/09/23 0456 08/10/23 0304 08/11/23 0153  WBC 25.2* 38.0* 26.6* 24.1*  NEUTROABS 8.2*  --   --   --   HGB 11.6* 10.7* 8.5* 8.3*  HCT 41.6 35.8* 27.5* 26.4*  MCV 101.5* 94.2 91.7 90.1  PLT 530* 453* 379 310    Basic Metabolic Panel: Recent Labs  Lab 08/09/23 0214 08/09/23 0456 08/09/23 1741 08/09/23 2113 08/10/23 0304 08/10/23 1717 08/11/23 0153  NA 136   < > 140 139 138 137 134*  K 4.2   < > 4.5 4.4 4.4 4.3 4.0  CL 102   < > 107 107 106 103 102  CO2 14*   < > 20* 21* 23 26 24   GLUCOSE 418*   < >  208* 190* 136* 150* 113*  BUN 13   < > 22 22 23 20 19   CREATININE 1.23*   < > 1.16* 1.13* 1.06* 1.05* 0.98  CALCIUM  8.7*   < > 7.2* 7.2* 7.1* 7.4* 7.5*  MG 2.3  --   --   --  1.3*  --  2.1  PHOS  --   --   --   --  3.4  --  3.3   < > = values in this interval not displayed.   GFR: Estimated Creatinine Clearance: 33.5 mL/min (by C-G formula based on SCr of 0.98 mg/dL). Recent Labs  Lab 08/09/23 0214 08/09/23 0456 08/09/23 0942 08/09/23 1259 08/10/23 0304 08/10/23 1717 08/11/23 0153  PROCALCITON  --  0.33  --   --   --   --   --   WBC 25.2* 38.0*  --   --  26.6*  --  24.1*  LATICACIDVEN >9.0*  5.5* 4.2* 4.6*  --  1.0  --     Liver Function Tests: Recent Labs  Lab 08/09/23 0214 08/10/23 0304 08/11/23 0153  AST 97* 38 23  ALT 51* 53* 37  ALKPHOS 129* 80 71  BILITOT 0.3 0.3 0.4  PROT 6.7 5.0* 5.6*  ALBUMIN 2.9* 2.4* 2.3*   No results for input(s): LIPASE, AMYLASE in the last 168 hours. No results for input(s): AMMONIA in the last 168 hours.  ABG    Component Value Date/Time   PHART 7.39 08/10/2023 1717   PCO2ART 42 08/10/2023 1717   PO2ART 84 08/10/2023 1717   HCO3 25.4 08/10/2023 1717   ACIDBASEDEF 3.8 (H) 08/10/2023 0006   O2SAT 98.7 08/10/2023 1717     Coagulation Profile: Recent Labs  Lab 08/09/23 0214  INR 1.4*    Cardiac Enzymes: No results for input(s): CKTOTAL, CKMB, CKMBINDEX, TROPONINI in the last 168 hours.  HbA1C: Hemoglobin A1C  Date/Time Value Ref Range Status  04/28/2016 12:00 AM 7.7%  Final   HB A1C (BAYER DCA - WAIVED)  Date/Time Value Ref Range Status  06/12/2023 09:28 AM 7.4 (H) 4.8 - 5.6 % Final    Comment:             Prediabetes: 5.7 - 6.4          Diabetes: >6.4          Glycemic control for adults with diabetes: <7.0   02/17/2023 01:36 PM 7.5 (H) 4.8 - 5.6 % Final    Comment:             Prediabetes: 5.7 - 6.4          Diabetes: >6.4          Glycemic control for adults with diabetes: <7.0    Hgb A1c MFr  Bld  Date/Time Value Ref Range Status  04/16/2023 05:22 AM 6.7 (H) 4.8 - 5.6 % Final    Comment:    (NOTE)         Prediabetes: 5.7 - 6.4         Diabetes: >6.4         Glycemic control for adults with diabetes: <7.0     CBG: Recent Labs  Lab 08/10/23 1603 08/10/23 2003 08/10/23 2347 08/11/23 0341 08/11/23 0738  GLUCAP 105* 132* 109* 102* 97    Review of Systems:   Unable to assess due to intubation/sedation/critical illness   Past Medical History:  She,  has a past medical history of Arthritis, Asthma, Atrial fibrillation (HCC), Breast cancer (HCC) (1998), Bronchitis, CHF (congestive heart failure) (HCC), COPD (chronic obstructive pulmonary disease) (HCC), Diabetes mellitus without complication (HCC), Diverticulitis, Dyspnea, Endometriosis, GERD (gastroesophageal reflux disease), History of shingles (2000-2005), Hypercholesteremia, Hypertension, IBS (irritable bowel syndrome), Low back pain, Neuropathy, Orthopnea, Oxygen  dependent, Personal history of chemotherapy, Personal history of radiation therapy, Pneumonia, Scoliosis, Sleep apnea, Stroke (HCC) (2010), and 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   Surgical History:   Past Surgical History:  Procedure Laterality Date   ABDOMINAL HYSTERECTOMY  1987   BACK SURGERY     Tailbone removed following fracture   BREAST SURGERY Right    mastectomy   CARDIAC CATHETERIZATION     CATARACT EXTRACTION W/PHACO Right 05/19/2019   Procedure: CATARACT EXTRACTION PHACO AND INTRAOCULAR LENS PLACEMENT (IOC), RIGHT, DIABETIC;  Surgeon: Ferol Rogue, MD;  Location: [facility] ORS;  Service: Ophthalmology;  Laterality: Right;  Lot # [id number] H US : 00:43.8 CDE: 4.59   CATARACT EXTRACTION W/PHACO Left 06/16/2019  Procedure: CATARACT EXTRACTION PHACO AND INTRAOCULAR LENS PLACEMENT (IOC) LEFT VISION BLUE DIABETIC;  Surgeon: Ferol Rogue, MD;  Location: [facility] ORS;  Service: Ophthalmology;  Laterality: Left;  Lot #[id number] H US : 00:46.9 CDE: 6.53   COCCYX  REMOVAL     COLONOSCOPY WITH PROPOFOL  N/A 01/01/2017   Procedure: COLONOSCOPY WITH PROPOFOL ;  Surgeon: Therisa Bi, MD;  Location: [facility] ENDOSCOPY;  Service: Endoscopy;  Laterality: N/A;   COLONOSCOPY WITH PROPOFOL  N/A 12/11/2021   Procedure: COLONOSCOPY WITH PROPOFOL ;  Surgeon: Unk Corinn Skiff, MD;  Location: [facility] ENDOSCOPY;  Service: Gastroenterology;  Laterality: N/A;   ELBOW ARTHROSCOPY WITH TENDON RECONSTRUCTION     ESOPHAGOGASTRODUODENOSCOPY (EGD) WITH PROPOFOL  N/A 01/01/2017   Procedure: ESOPHAGOGASTRODUODENOSCOPY (EGD) WITH PROPOFOL ;  Surgeon: Therisa Bi, MD;  Location[facility] ENDOSCOPY;  Service: Endoscopy;  Laterality: N/A;   ESOPHAGOGASTRODUODENOSCOPY (EGD) WITH PROPOFOL  N/A 12/11/2021   Procedure: ESOPHAGOGASTRODUODENOSCOPY (EGD) WITH PROPOFOL ;  Surgeon: Unk Corinn Skiff, MD;  Location: [facility] ENDOSCOPY;  Service: Gastroenterology;  Laterality: N/A;   INTRATHECAL PUMP IMPLANT     LEFT HEART CATH AND CORONARY ANGIOGRAPHY N/A 04/28/2017   Procedure: LEFT HEART CATH AND CORONARY ANGIOGRAPHY;  Surgeon: Mady Bruckner, MD;  Location: [facility] INVASIVE CV LAB;  Service: Cardiovascular;  Laterality: N/A;   MASTECTOMY Right 06/1997   morphine  pump  2011   PLANTAR FASCIA RELEASE     TOTAL HIP ARTHROPLASTY Left    TRIGGER FINGER RELEASE       Social History:   reports that she quit smoking about 19 years ago. Her smoking use included cigarettes. She started smoking about 49 years ago. She has a 30 pack-year smoking history. She has never used smokeless tobacco. She reports that she does not drink alcohol  and does not use drugs.   Family History:  Her family history includes Arthritis in her paternal grandfather; Cancer in her paternal grandmother and sister; Cancer (age of onset: 78) in her father; Cancer (age of onset: 62) in her mother; Hyperlipidemia in her sister and son; Hypertension in her sister; Osteoporosis in her maternal grandmother; Seizures in her son; Thyroid  disease in her  mother. There is no history of Breast cancer.   Allergies Allergies  Allergen Reactions   Other Palpitations    IV steroids   Pain Patch [Menthol] Anaphylaxis   Avelox  [Moxifloxacin  Hcl In Nacl] Other (See Comments)    Upset stomach   Doxycycline  Diarrhea   Erythromycin  Nausea Only and Other (See Comments)    Can take a Z-Pak just fine Other reaction(s): Other (See Comments) Can take Z-Pak  Can take a Z-Pak just fine   Fentanyl  Nausea Only and Rash   Moxifloxacin  Hcl Other (See Comments)    Upset stomach Upset stomach   Oxycontin [Oxycodone] Hives   Ozempic [Semaglutide] Nausea Only     Home Medications  Prior to Admission medications   Medication Sig Start Date End Date Taking? Authorizing Provider  ACCU-CHEK AVIVA PLUS test strip TEST THREE TIMES DAILY 05/11/21   [name], Jolene T, NP  Accu-Chek Softclix Lancets lancets TEST BLOOD SUGAR THREE TIMES DAILY 02/26/23   [name], Jolene T, NP  albuterol  (PROVENTIL  HFA;VENTOLIN  HFA) 108 (90 BASE) MCG/ACT inhaler Inhale 2 puffs into the lungs every 6 (six) hours as needed for wheezing or shortness of breath.     [provider]  albuterol  (PROVENTIL ) (2.5 MG/3ML) 0.083% nebulizer solution Take 3 mLs (2.5 mg total) by nebulization every 4 (four) hours as needed for wheezing or shortness of breath. 07/18/22 08/04/23  Raenelle Coria, MD  Alcohol  Swabs (DROPSAFE ALCOHOL  PREP) 70 % PADS USE TWICE DAILY  WITH  SUGAR  CHECKS 11/14/21   [name], Jolene T, NP  apixaban  (ELIQUIS ) 5 MG TABS tablet Take 1 tablet (5 mg total) by mouth 2 (two) times daily. 06/24/22   [name], Jolene T, NP  atorvastatin  (LIPITOR) 40 MG tablet Take 1 tablet by mouth once daily 06/08/23   [name], Jolene T, NP  Blood Glucose Monitoring Suppl (TRUE METRIX METER) w/Device KIT Use to check blood sugar 4 times a day 05/14/21   [name], Jolene T, NP  busPIRone  (BUSPAR ) 5 MG tablet Take 1 tablet by mouth twice daily 06/02/23   [name], Jolene T, NP  cholecalciferol   (VITAMIN D3) 25 MCG (1000 UNIT) tablet Take 1,000 Units by mouth daily.    [provider]  Continuous Glucose Receiver (FREESTYLE LIBRE 2 READER) DEVI To check blood sugars 3-4 times daily due to insulin  use. DX E11.40 12/11/22   [name], Melanie T, NP  Continuous Glucose Sensor (FREESTYLE LIBRE 2 SENSOR) MISC To check blood sugars 3-4 times daily due to insulin  use. DX E11.40 12/30/22   [name], Melanie T, NP  Cyanocobalamin  1000 MCG/ML KIT Inject 1,000 mcg as directed every 30 (thirty) days.    [provider]  cyclobenzaprine  (FLEXERIL ) 5 MG tablet Take 5 mg by mouth 2 (two) times daily as needed for muscle spasms. Takes very rarely    [provider]  Fluticasone -Umeclidin-Vilant (TRELEGY ELLIPTA ) 100-62.5-25 MCG/ACT AEPB INHALE 1 PUFF INTO THE LUNGS DAILY -42 DAY EXPIRATION AFTER OPENING FOIL TRAY 02/13/23   [name], Jolene T, NP  folic acid  (FOLVITE ) 1 MG tablet TAKE 1 TABLET EVERY DAY 10/14/21   Dasie Tinnie MATSU, NP  furosemide  (LASIX ) 40 MG tablet Take 0.5 tablets (20 mg total) by mouth daily. 07/27/23   [name], Jolene T, NP  gabapentin  (NEURONTIN ) 800 MG tablet Take 1 tablet (800 mg total) by mouth 3 (three) times daily. 06/12/23   [name], Jolene T, NP  HYDROcodone -acetaminophen  (NORCO) 10-325 MG tablet Take 1 tablet by mouth every 4 (four) hours as needed.    [provider]  insulin  lispro (HUMALOG  KWIKPEN) 100 UNIT/ML KwikPen Inject 5 Units into the skin 3 (three) times daily before meals. Do not inject insulin  if you are not going to eat meal.  Only take before a meal. 12/11/22   [name], Jolene T, NP  losartan  (COZAAR ) 25 MG tablet Take 0.5 tablets (12.5 mg total) by mouth daily. 06/12/23   [name], Jolene T, NP  metFORMIN  (GLUCOPHAGE ) 1000 MG tablet Take 1 tablet (1,000 mg total) by mouth 2 (two) times daily with a meal. 06/12/23   [name], Jolene T, NP  metoprolol  succinate (TOPROL -XL) 25 MG 24 hr tablet Take 0.5 tablets (12.5 mg total) by mouth daily. 06/12/23    [name], Jolene T, NP  montelukast  (SINGULAIR ) 10 MG tablet Take 1 tablet (10 mg total) by mouth at bedtime. 06/12/23   [name], Jolene T, NP  naloxone  (NARCAN ) nasal spray 4 mg/0.1 mL Place 1 spray into the nose as needed for up to 365 doses (for opioid-induced respiratory depresssion). In case of emergency (overdose), spray once into each nostril. If no response within 3 minutes, repeat application and call 911. 08/04/23 08/03/24  Tanya Glisson, MD  Nebulizers (EASY AIR COMPRESSOR NEBULIZER) MISC Use to inhaler nebulizer treatments at needed per instructions on nebulizer prescription 07/21/22   [name], Jolene T, NP  ondansetron  (ZOFRAN ) 4 MG tablet Take 1 tablet (4 mg total) by mouth daily as  needed for nausea or vomiting. 03/14/23 03/13/24  Cyrena Mylar, MD  OXYGEN  Inhale 2 L into the lungs at bedtime.    [provider]  PAIN MANAGEMENT INTRATHECAL, IT, PUMP 1 each by Intrathecal route. Intrathecal (IT) medication:  Morphine  5.0 mg/ml, Bupivacaine 20.0 mg/ml,  Clonidine  100.0 mcg/ml Patient does not remember current. Adjusted every 3 months at [facility]    [provider]  pantoprazole  (PROTONIX ) 40 MG tablet Take 1 tablet by mouth once daily 05/26/23   [name], Jolene T, NP  TRESIBA  FLEXTOUCH 100 UNIT/ML FlexTouch Pen INJECT 55 UNITS SUBCUTANEOUSLY AT BEDTIME 06/15/23   [name], Jolene T, NP  TRULICITY  4.5 MG/0.5ML SOPN INJECT 4.5MG  INTO THE SKIN ONCE WEEKLY AS DIRECTED 05/01/23   [name], Jolene T, NP  venlafaxine  XR (EFFEXOR -XR) 150 MG 24 hr capsule Take 1 capsule by mouth once daily 07/28/23   [name], Jolene T, NP     Critical care time: 40 minutes     Inge Lecher, AGACNP-BC [facility] Prefer epic messenger for cross cover needs If after hours, please call E-link

## 2023-08-11 NOTE — Plan of Care (Signed)
 Patient extubated this am. Re-intubated- patient lethargic and increased work of breathing. Tylenol  given for patients temperature. OG placed and placement confirmed by Shellia. Orders to start tube feeds. Foley intact with adequate urine output. Family at bedside and updated throughout the day. Continue to assess.

## 2023-08-11 NOTE — Progress Notes (Signed)
 Patients heart rate 130's and patients blood pressure in the 200's. Per Dr. [name] patient extubated. Patients heart rate and blood pressure continues to be elevated. At this time MD does not want to give any PRN's. Orders to wait until 8:35 and re-evaluate. Precedex  is off and orders to keep it off. Continue to assess.

## 2023-08-11 NOTE — Progress Notes (Addendum)
 Nutrition Follow-up  DOCUMENTATION CODES:   Not applicable  INTERVENTION:   Vital 1.2@55ml /hr- Initiate at 61ml/hr and increase by 10ml/hr q 8 hrs until goal rate.   Free water  flushes 30ml q4 hours to maintain tube patency   Regimen provides 1584kcal/day, 99g/day of protein and 127ml/day of free water .   Pt at high refeed risk; recommend monitor potassium, magnesium  and phosphorus labs daily until stable  Daily weights   NUTRITION DIAGNOSIS:   Inadequate oral intake related to inability to eat as evidenced by NPO status. -ongoing   GOAL:   Provide needs based on ASPEN/SCCM guidelines -not met   MONITOR:   Vent status, Labs, Weight trends, TF tolerance, I & O's, Skin  ASSESSMENT:   73 y/o female with h/o IBS, OSA, DDD, chronic pain with opioid dependence s/p intrathecal pump, B12 deficiency, CHF, Afib, breast cancer (s/p mastectomy/chemoradiation 1998), GERD, COPD, MDD, DM, CKD III, HLD, HTN, NSTEMI, PE and stroke who is admitted with CAP, small bilateral subsegmental PE's, shock and AKI.  Pt extubated and then re-intubated today. OGT in place. Will plan to initiate tube feeds today. Pt is likely at refeed risk. Per chart, pt appears weight stable pta.   Medications reviewed and include: solu-cortef , insulin , protonix , azithromycin , ceftriaxone , heparin , levophed , propofol    Labs reviewed: Na 134(L), K 4.0 wnl, P 3.3 wnl, Mg 2.1 wnl Wbc- 28.4(H), Hgb 9.8(L), Hct 30.4(L) Cbgs- 150, 97, 102 x 24 hrs  AIC 6.7(H)- 9/12  Patient is currently intubated on ventilator support MV: 9.5 L/min Temp (24hrs), Avg:101.1 F (38.4 C), Min:99 F (37.2 C), Max:102.7 F (39.3 C)  Propofol - 11.62ml/hr- provides 310kcal/day   MAP- >30mmHg   UOP-   NUTRITION - FOCUSED PHYSICAL EXAM:  Flowsheet Row Most Recent Value  Orbital Region Mild depletion  Upper Arm Region No depletion  Thoracic and Lumbar Region No depletion  Buccal Region No depletion  Temple Region Mild  depletion  Clavicle Bone Region Mild depletion  Clavicle and Acromion Bone Region Mild depletion  Scapular Bone Region No depletion  Dorsal Hand No depletion  Patellar Region Mild depletion  Anterior Thigh Region No depletion  Posterior Calf Region No depletion  Edema (RD Assessment) Mild  Hair Reviewed  Eyes Reviewed  Mouth Reviewed  Skin Reviewed  Nails Reviewed   Diet Order:   Diet Order             Diet NPO time specified  Diet effective now                  EDUCATION NEEDS:   Not appropriate for education at this time  Skin:  Skin Assessment: Reviewed RN Assessment  Last BM:  Unknown  Height:   Ht Readings from Last 1 Encounters:  08/09/23 4' 10.27 (1.48 m)    Weight:   Wt Readings from Last 1 Encounters:  08/11/23 42.5 kg    Ideal Body Weight:  43.9 kg  BMI:  Body mass index is 19.4 kg/m.  Estimated Nutritional Needs:   Kcal:  1652kcal/day  Protein:  90-105g/day  Fluid:  1.4-1.6L/day  Augustin Shams MS, RD, LDN If unable to be reached, please send secure chat to RD inpatient available from 8:00a-4:00p daily

## 2023-08-11 NOTE — Progress Notes (Signed)
*  PRELIMINARY RESULTS* Echocardiogram 2D Echocardiogram has been performed.  Carolyne Fiscal 08/11/2023, 10:52 AM

## 2023-08-11 NOTE — Progress Notes (Signed)
 PHARMACY - ANTICOAGULATION CONSULT NOTE  Pharmacy Consult for Heparin   Indication: atrial fibrillation and pulmonary embolus  Allergies  Allergen Reactions   Other Palpitations    IV steroids   Pain Patch [Menthol] Anaphylaxis   Avelox  [Moxifloxacin  Hcl In Nacl] Other (See Comments)    Upset stomach   Doxycycline  Diarrhea   Erythromycin  Nausea Only and Other (See Comments)    Can take a Z-Pak just fine Other reaction(s): Other (See Comments) Can take Z-Pak  Can take a Z-Pak just fine   Fentanyl  Nausea Only and Rash   Moxifloxacin  Hcl Other (See Comments)    Upset stomach Upset stomach   Oxycontin [Oxycodone] Hives   Ozempic [Semaglutide] Nausea Only    Patient Measurements: Height: 4' 10.27 (148 cm) Weight: 72.8 kg (160 lb 7.9 oz) IBW/kg (Calculated) : 41.52 Heparin  Dosing Weight: 56.7 kg   Vital Signs: Temp: 100.8 F (38.2 C) (01/07 0245) Temp Source: Bladder (01/06 2000) BP: 106/53 (01/07 0000) Pulse Rate: 88 (01/07 0245)  Labs: Recent Labs    08/09/23 0214 08/09/23 0456 08/09/23 0744 08/09/23 0954 08/10/23 0304 08/10/23 1556 08/10/23 1717 08/11/23 0150 08/11/23 0153  HGB 11.6* 10.7*  --   --  8.5*  --   --   --  8.3*  HCT 41.6 35.8*  --   --  27.5*  --   --   --  26.4*  PLT 530* 453*  --   --  379  --   --   --  310  APTT 38*  --   --    < > 194* 59*  --  93*  --   LABPROT 17.3*  --   --   --   --   --   --   --   --   INR 1.4*  --   --   --   --   --   --   --   --   HEPARINUNFRC  --   --  >1.10*  --  >1.10*  --   --   --   --   CREATININE 1.23* 1.13*  --    < > 1.06*  --  1.05*  --  0.98  TROPONINIHS 20* 95*  --   --   --   --   --   --   --    < > = values in this interval not displayed.    Estimated Creatinine Clearance: 43.6 mL/min (by C-G formula based on SCr of 0.98 mg/dL).   Medical History: Past Medical History:  Diagnosis Date   Arthritis    Asthma    Atrial fibrillation (HCC)    Breast cancer (HCC) 1998   right breast ca with  mastectomy and chemotherapy and radiation   Bronchitis    CHF (congestive heart failure) (HCC)    with Morphine  withdrawal   COPD (chronic obstructive pulmonary disease) (HCC)    Diabetes mellitus without complication (HCC)    Diverticulitis    diverticulosis also   Dyspnea    Endometriosis    GERD (gastroesophageal reflux disease)    History of shingles 2000-2005   Hypercholesteremia    Hypertension    IBS (irritable bowel syndrome)    Low back pain    a. Implanted morphine /bupivicaine/clonidine  pump.   Neuropathy    Orthopnea    Oxygen  dependent    uses at night   Personal history of chemotherapy    Personal history of radiation therapy  Pneumonia    pneumonia 5-6 times, history of bronchitis also   Scoliosis    Sleep apnea    does not use cpap   Stroke (HCC) 2010   TIA, 10 years ago   Withdrawal from sedative drug (HCC)    withdrawal from morphine  when pump batteries died    Medications:  Medications Prior to Admission  Medication Sig Dispense Refill Last Dose/Taking   ACCU-CHEK AVIVA PLUS test strip TEST THREE TIMES DAILY 300 strip 3    Accu-Chek Softclix Lancets lancets TEST BLOOD SUGAR THREE TIMES DAILY 300 each 3    albuterol  (PROVENTIL  HFA;VENTOLIN  HFA) 108 (90 BASE) MCG/ACT inhaler Inhale 2 puffs into the lungs every 6 (six) hours as needed for wheezing or shortness of breath.       albuterol  (PROVENTIL ) (2.5 MG/3ML) 0.083% nebulizer solution Take 3 mLs (2.5 mg total) by nebulization every 4 (four) hours as needed for wheezing or shortness of breath. 75 mL 2    Alcohol  Swabs (DROPSAFE ALCOHOL  PREP) 70 % PADS USE TWICE DAILY  WITH  SUGAR  CHECKS 200 each 2    apixaban  (ELIQUIS ) 5 MG TABS tablet Take 1 tablet (5 mg total) by mouth 2 (two) times daily. 180 tablet 4    atorvastatin  (LIPITOR) 40 MG tablet Take 1 tablet by mouth once daily 90 tablet 0    Blood Glucose Monitoring Suppl (TRUE METRIX METER) w/Device KIT Use to check blood sugar 4 times a day 1 kit 4     busPIRone  (BUSPAR ) 5 MG tablet Take 1 tablet by mouth twice daily 60 tablet 11    cholecalciferol  (VITAMIN D3) 25 MCG (1000 UNIT) tablet Take 1,000 Units by mouth daily.      Continuous Glucose Receiver (FREESTYLE LIBRE 2 READER) DEVI To check blood sugars 3-4 times daily due to insulin  use. DX E11.40 2 each 5    Continuous Glucose Sensor (FREESTYLE LIBRE 2 SENSOR) MISC To check blood sugars 3-4 times daily due to insulin  use. DX E11.40 2 each 5    Cyanocobalamin  1000 MCG/ML KIT Inject 1,000 mcg as directed every 30 (thirty) days.      cyclobenzaprine  (FLEXERIL ) 5 MG tablet Take 5 mg by mouth 2 (two) times daily as needed for muscle spasms. Takes very rarely      Fluticasone -Umeclidin-Vilant (TRELEGY ELLIPTA ) 100-62.5-25 MCG/ACT AEPB INHALE 1 PUFF INTO THE LUNGS DAILY -42 DAY EXPIRATION AFTER OPENING FOIL TRAY 180 each 2    folic acid  (FOLVITE ) 1 MG tablet TAKE 1 TABLET EVERY DAY 90 tablet 0    furosemide  (LASIX ) 40 MG tablet Take 0.5 tablets (20 mg total) by mouth daily. 90 tablet 4    gabapentin  (NEURONTIN ) 800 MG tablet Take 1 tablet (800 mg total) by mouth 3 (three) times daily. 270 tablet 4    HYDROcodone -acetaminophen  (NORCO) 10-325 MG tablet Take 1 tablet by mouth every 4 (four) hours as needed.      insulin  lispro (HUMALOG  KWIKPEN) 100 UNIT/ML KwikPen Inject 5 Units into the skin 3 (three) times daily before meals. Do not inject insulin  if you are not going to eat meal.  Only take before a meal. 15 mL 3    losartan  (COZAAR ) 25 MG tablet Take 0.5 tablets (12.5 mg total) by mouth daily. 45 tablet 4    metFORMIN  (GLUCOPHAGE ) 1000 MG tablet Take 1 tablet (1,000 mg total) by mouth 2 (two) times daily with a meal. 180 tablet 4    metoprolol  succinate (TOPROL -XL) 25 MG 24 hr tablet Take  0.5 tablets (12.5 mg total) by mouth daily. 45 tablet 4    montelukast  (SINGULAIR ) 10 MG tablet Take 1 tablet (10 mg total) by mouth at bedtime. 90 tablet 4    naloxone  (NARCAN ) nasal spray 4 mg/0.1 mL Place 1  spray into the nose as needed for up to 365 doses (for opioid-induced respiratory depresssion). In case of emergency (overdose), spray once into each nostril. If no response within 3 minutes, repeat application and call 911. 1 each 1    Nebulizers (EASY AIR COMPRESSOR NEBULIZER) MISC Use to inhaler nebulizer treatments at needed per instructions on nebulizer prescription 1 each 0    ondansetron  (ZOFRAN ) 4 MG tablet Take 1 tablet (4 mg total) by mouth daily as needed for nausea or vomiting. 30 tablet 1    OXYGEN  Inhale 2 L into the lungs at bedtime.      PAIN MANAGEMENT INTRATHECAL, IT, PUMP 1 each by Intrathecal route. Intrathecal (IT) medication:  Morphine  5.0 mg/ml, Bupivacaine 20.0 mg/ml,  Clonidine  100.0 mcg/ml Patient does not remember current. Adjusted every 3 months at [facility]      pantoprazole  (PROTONIX ) 40 MG tablet Take 1 tablet by mouth once daily 90 tablet 0    TRESIBA  FLEXTOUCH 100 UNIT/ML FlexTouch Pen INJECT 55 UNITS SUBCUTANEOUSLY AT BEDTIME 30 mL 0    TRULICITY  4.5 MG/0.5ML SOPN INJECT 4.5MG  INTO THE SKIN ONCE WEEKLY AS DIRECTED 12 mL 0    venlafaxine  XR (EFFEXOR -XR) 150 MG 24 hr capsule Take 1 capsule by mouth once daily 90 capsule 0     Assessment: Pharmacy consulted to dose heparin  in this 73 year old female admitted with respiratory failure, possible PE.   Pt was on Eliquis  5 mg PO BID PTA for Afib.   Unsure of last dose.  CrCl = 36.5 ml/min   1/5 1741 aPTT 55 1/6 0304 aPTT 194,  HL >1.10 1/6 1556 aPTT 59  Subtherapeutic 1/7 0150 aPTT 93, therapeutic x 1  Goal of Therapy:  aPTT : 66 - 102  Heparin  level 0.3-0.7 units/ml Monitor platelets by anticoagulation protocol: Yes   Plan:  Continue heparin  infusion at 1050 units/hour Recheck aPTT level in 8 hours to confirm Check HL daily until correlation w/ aPTT confirmed Monitor CBC and signs/symptoms of bleeding  Thank you for involving pharmacy in this patient's care.  Rankin CANDIE Dills, PharmD,  [facility] 08/11/2023 3:06 AM

## 2023-08-11 NOTE — Procedures (Signed)
 Intubation Procedure Note  Jordan Chan  [id number]  07-14-1951  Date:08/11/23  Time:9:31 AM   Provider Performing:Dafna Romo D Shellia    Procedure: Intubation (31500)  Indication(s) Respiratory Failure  Consent Risks of the procedure as well as the alternatives and risks of each were explained to the patient and/or caregiver.  Consent for the procedure was obtained and is signed in the bedside chart   Anesthesia Etomidate  and Succinylcholine    Time Out Verified patient identification, verified procedure, site/side was marked, verified correct patient position, special equipment/implants available, medications/allergies/relevant history reviewed, required imaging and test results available.   Sterile Technique Usual hand hygeine, masks, and gloves were used   Procedure Description Patient positioned in bed supine.  Sedation given as noted above.  Patient was intubated with endotracheal tube using Glidescope.  View was Grade 1 full glottis .  Number of attempts was 1.  Colorimetric CO2 detector was consistent with tracheal placement.   Complications/Tolerance None; patient tolerated the procedure well. Chest X-ray is ordered to verify placement.   EBL Minimal   Specimen(s) None   Size 7.5 ETT Tube secured at the 22 cm mark at the lip    Inge Shellia, AGACNP-BC [facility] Prefer epic messenger for cross cover needs If after hours, please call E-link

## 2023-08-11 NOTE — Progress Notes (Signed)
 Pharmacy Antibiotic Note  Jordan Chan is a 73 y.o. female w/ PMH of CHF, , chronic pain syndrome with intrathecal pump, HTN, HLD, depression/anxiety, breast cancer hx, COPD, DM, PE admitted on 08/09/2023 with a PE.  Pharmacy has been consulted for cefepime  dosing.  Plan: start cefepime  2 grams IV every 12 hours ---follow renal function for needed dose adjustments   Height: 4' 10.27 (148 cm) Weight: 42.5 kg (93 lb 11.1 oz) IBW/kg (Calculated) : 41.52  Temp (24hrs), Avg:100.8 F (38.2 C), Min:97.9 F (36.6 C), Max:102.7 F (39.3 C)  Recent Labs  Lab 08/09/23 0214 08/09/23 0456 08/09/23 0942 08/09/23 0954 08/09/23 1259 08/09/23 1741 08/09/23 2113 08/10/23 0304 08/10/23 1717 08/11/23 0153 08/11/23 0937  WBC 25.2* 38.0*  --   --   --   --   --  26.6*  --  24.1* 28.4*  CREATININE 1.23* 1.13*  --    < > 1.14* 1.16* 1.13* 1.06* 1.05* 0.98  --   LATICACIDVEN >9.0* 5.5* 4.2*  --  4.6*  --   --   --  1.0  --   --    < > = values in this interval not displayed.    Estimated Creatinine Clearance: 33.5 mL/min (by C-G formula based on SCr of 0.98 mg/dL).    Allergies  Allergen Reactions   Other Palpitations    IV steroids   Pain Patch [Menthol] Anaphylaxis   Avelox  [Moxifloxacin  Hcl In Nacl] Other (See Comments)    Upset stomach   Doxycycline  Diarrhea   Erythromycin  Nausea Only and Other (See Comments)    Can take a Z-Pak just fine Other reaction(s): Other (See Comments) Can take Z-Pak  Can take a Z-Pak just fine   Fentanyl  Nausea Only and Rash   Moxifloxacin  Hcl Other (See Comments)    Upset stomach Upset stomach   Oxycontin [Oxycodone] Hives   Ozempic [Semaglutide] Nausea Only    Antimicrobials this admission: 01/05 ceftriaxone  >> 01/07 01/05 azithromycin  >> 01/07 cefepime  >>   Microbiology results: 01/05 BCx: NGTD 01/07 Sputum: pending  01/05 MRSA PCR: negative  Thank you for allowing pharmacy to be a part of this patient's care.  Jordan Chan 08/11/2023 3:28 PM

## 2023-08-11 NOTE — Progress Notes (Signed)
 PHARMACY - ANTICOAGULATION CONSULT NOTE  Pharmacy Consult for Heparin   Indication: atrial fibrillation and pulmonary embolus  Patient Measurements: Height: 4' 10.27 (148 cm) Weight: 42.5 kg (93 lb 11.1 oz) IBW/kg (Calculated) : 41.52 Heparin  Dosing Weight: 56.7 kg   Labs: Recent Labs    08/09/23 0214 08/09/23 0456 08/09/23 0744 08/09/23 0954 08/10/23 0304 08/10/23 1556 08/10/23 1717 08/11/23 0150 08/11/23 0153 08/11/23 0936 08/11/23 0937 08/11/23 1949  HGB 11.6* 10.7*  --   --  8.5*  --   --   --  8.3*  --  9.8*  --   HCT 41.6 35.8*  --   --  27.5*  --   --   --  26.4*  --  30.4*  --   PLT 530* 453*  --   --  379  --   --   --  310  --  329  --   APTT 38*  --   --    < > 194*   < >  --  93*  --  66*  --  82*  LABPROT 17.3*  --   --   --   --   --   --   --   --   --   --   --   INR 1.4*  --   --   --   --   --   --   --   --   --   --   --   HEPARINUNFRC  --   --  >1.10*  --  >1.10*  --   --   --   --  0.85*  --   --   CREATININE 1.23* 1.13*  --    < > 1.06*  --  1.05*  --  0.98  --   --   --   TROPONINIHS 20* 95*  --   --   --   --   --   --   --   --   --   --    < > = values in this interval not displayed.    Estimated Creatinine Clearance: 33.5 mL/min (by C-G formula based on SCr of 0.98 mg/dL).   Medical History: Past Medical History:  Diagnosis Date   Arthritis    Asthma    Atrial fibrillation (HCC)    Breast cancer (HCC) 1998   right breast ca with mastectomy and chemotherapy and radiation   Bronchitis    CHF (congestive heart failure) (HCC)    with Morphine  withdrawal   COPD (chronic obstructive pulmonary disease) (HCC)    Diabetes mellitus without complication (HCC)    Diverticulitis    diverticulosis also   Dyspnea    Endometriosis    GERD (gastroesophageal reflux disease)    History of shingles 2000-2005   Hypercholesteremia    Hypertension    IBS (irritable bowel syndrome)    Low back pain    a. Implanted morphine /bupivicaine/clonidine   pump.   Neuropathy    Orthopnea    Oxygen  dependent    uses at night   Personal history of chemotherapy    Personal history of radiation therapy    Pneumonia    pneumonia 5-6 times, history of bronchitis also   Scoliosis    Sleep apnea    does not use cpap   Stroke (HCC) 2010   TIA, 10 years ago   Withdrawal from sedative drug (HCC)    withdrawal from morphine  when  pump batteries died   Assessment: Pharmacy consulted to dose heparin  in this 73 year old female admitted with respiratory failure, possible PE.   Pt was on Eliquis  5 mg PO BID PTA for Afib.   Unsure of last dose.  1/5 1741 aPTT 55 1/6 0304 aPTT 194,  HL >1.10 1/6 1556 aPTT 59  Subtherapeutic 1/7 0150 aPTT 93, therapeutic x 1 1/7 1949 aPTT 82, therapeutic x 2  Goal of Therapy:  aPTT : 66 - 102  Heparin  level 0.3-0.7 units/ml Monitor platelets by anticoagulation protocol: Yes   Plan:  --aPTT is therapeutic x 1 --Continue heparin  infusion at 1100 units/hour --Recheck aPTT and HL in 8 hours --Monitor CBC and signs/symptoms of bleeding  Thank you for involving pharmacy in this patient's care.   Marolyn KATHEE Mare 08/11/2023 8:33 PM

## 2023-08-11 NOTE — Progress Notes (Signed)
 PHARMACY CONSULT NOTE - FOLLOW UP  Pharmacy Consult for Electrolyte Monitoring and Replacement   Recent Labs: Potassium (mmol/L)  Date Value  08/11/2023 4.0  09/14/2013 3.5   Magnesium  (mg/dL)  Date Value  98/92/7974 2.1   Calcium  (mg/dL)  Date Value  98/92/7974 7.5 (L)   Calcium , Total (mg/dL)  Date Value  97/88/7984 9.0   Albumin (g/dL)  Date Value  98/92/7974 2.3 (L)  06/17/2023 4.2  09/14/2013 3.2 (L)   Phosphorus (mg/dL)  Date Value  98/92/7974 3.3   Sodium (mmol/L)  Date Value  08/11/2023 134 (L)  06/17/2023 138  09/14/2013 133 (L)     Assessment: 72 YOF w/ PMH of CHF , chronic pain syndrome with intrathecal pump, breast cancer hx, COPD, DM, recurrent exacerbation of COPD now coming in with shock due to sepsis with pneumonia and bilateral PE. Pharmacy is asked to follow and replace electrolytes while in CCU  Goal of Therapy:  Electrolytes WNL  Plan:  ---no electrolyte replacement warranted for today ---recheck electrolytes in am  [name] ,PharmD Clinical Pharmacist 08/11/2023 7:01 AM

## 2023-08-11 NOTE — Procedures (Signed)
 Extubation Procedure Note  Patient Details:   Name: Jordan Chan DOB: May 14, 1951 MRN: [id number]   Airway Documentation:    Vent end date: 08/11/23 Vent end time: 0810   Evaluation  O2 sats: stable throughout Complications: No apparent complications Patient did tolerate procedure well. Bilateral Breath Sounds: Coarse   Yes able to cough.  Extubated to HHFNC at 60L/75%.  MD at bedside.  Plan for ABG and close monitor.  Mabel PARAS Myda Detwiler 08/11/2023, 8:32 AM

## 2023-08-11 NOTE — Progress Notes (Signed)
 Patient agitated, blood pressure remains elevated and heart rate. PRN given. Blood gas ordered. Continue to assess.

## 2023-08-11 NOTE — Progress Notes (Signed)
 PHARMACY - ANTICOAGULATION CONSULT NOTE  Pharmacy Consult for Heparin   Indication: atrial fibrillation and pulmonary embolus  Allergies  Allergen Reactions   Other Palpitations    IV steroids   Pain Patch [Menthol] Anaphylaxis   Avelox  [Moxifloxacin  Hcl In Nacl] Other (See Comments)    Upset stomach   Doxycycline  Diarrhea   Erythromycin  Nausea Only and Other (See Comments)    Can take a Z-Pak just fine Other reaction(s): Other (See Comments) Can take Z-Pak  Can take a Z-Pak just fine   Fentanyl  Nausea Only and Rash   Moxifloxacin  Hcl Other (See Comments)    Upset stomach Upset stomach   Oxycontin [Oxycodone] Hives   Ozempic [Semaglutide] Nausea Only    Patient Measurements: Height: 4' 10.27 (148 cm) Weight: 42.5 kg (93 lb 11.1 oz) IBW/kg (Calculated) : 41.52 Heparin  Dosing Weight: 56.7 kg   Vital Signs: Temp: 100.2 F (37.9 C) (01/07 0649) Temp Source: Bladder (01/07 0400) BP: 97/48 (01/07 0400) Pulse Rate: 79 (01/07 0645)  Labs: Recent Labs    08/09/23 0214 08/09/23 0456 08/09/23 0744 08/09/23 0954 08/10/23 0304 08/10/23 1556 08/10/23 1717 08/11/23 0150 08/11/23 0153  HGB 11.6* 10.7*  --   --  8.5*  --   --   --  8.3*  HCT 41.6 35.8*  --   --  27.5*  --   --   --  26.4*  PLT 530* 453*  --   --  379  --   --   --  310  APTT 38*  --   --    < > 194* 59*  --  93*  --   LABPROT 17.3*  --   --   --   --   --   --   --   --   INR 1.4*  --   --   --   --   --   --   --   --   HEPARINUNFRC  --   --  >1.10*  --  >1.10*  --   --   --   --   CREATININE 1.23* 1.13*  --    < > 1.06*  --  1.05*  --  0.98  TROPONINIHS 20* 95*  --   --   --   --   --   --   --    < > = values in this interval not displayed.    Estimated Creatinine Clearance: 33.5 mL/min (by C-G formula based on SCr of 0.98 mg/dL).   Medical History: Past Medical History:  Diagnosis Date   Arthritis    Asthma    Atrial fibrillation (HCC)    Breast cancer (HCC) 1998   right breast ca with  mastectomy and chemotherapy and radiation   Bronchitis    CHF (congestive heart failure) (HCC)    with Morphine  withdrawal   COPD (chronic obstructive pulmonary disease) (HCC)    Diabetes mellitus without complication (HCC)    Diverticulitis    diverticulosis also   Dyspnea    Endometriosis    GERD (gastroesophageal reflux disease)    History of shingles 2000-2005   Hypercholesteremia    Hypertension    IBS (irritable bowel syndrome)    Low back pain    a. Implanted morphine /bupivicaine/clonidine  pump.   Neuropathy    Orthopnea    Oxygen  dependent    uses at night   Personal history of chemotherapy    Personal history of radiation therapy  Pneumonia    pneumonia 5-6 times, history of bronchitis also   Scoliosis    Sleep apnea    does not use cpap   Stroke (HCC) 2010   TIA, 10 years ago   Withdrawal from sedative drug (HCC)    withdrawal from morphine  when pump batteries died    Medications:  Medications Prior to Admission  Medication Sig Dispense Refill Last Dose/Taking   ACCU-CHEK AVIVA PLUS test strip TEST THREE TIMES DAILY 300 strip 3    Accu-Chek Softclix Lancets lancets TEST BLOOD SUGAR THREE TIMES DAILY 300 each 3    albuterol  (PROVENTIL  HFA;VENTOLIN  HFA) 108 (90 BASE) MCG/ACT inhaler Inhale 2 puffs into the lungs every 6 (six) hours as needed for wheezing or shortness of breath.       albuterol  (PROVENTIL ) (2.5 MG/3ML) 0.083% nebulizer solution Take 3 mLs (2.5 mg total) by nebulization every 4 (four) hours as needed for wheezing or shortness of breath. 75 mL 2    Alcohol  Swabs (DROPSAFE ALCOHOL  PREP) 70 % PADS USE TWICE DAILY  WITH  SUGAR  CHECKS 200 each 2    apixaban  (ELIQUIS ) 5 MG TABS tablet Take 1 tablet (5 mg total) by mouth 2 (two) times daily. 180 tablet 4    atorvastatin  (LIPITOR) 40 MG tablet Take 1 tablet by mouth once daily 90 tablet 0    Blood Glucose Monitoring Suppl (TRUE METRIX METER) w/Device KIT Use to check blood sugar 4 times a day 1 kit 4     busPIRone  (BUSPAR ) 5 MG tablet Take 1 tablet by mouth twice daily 60 tablet 11    cholecalciferol  (VITAMIN D3) 25 MCG (1000 UNIT) tablet Take 1,000 Units by mouth daily.      Continuous Glucose Receiver (FREESTYLE LIBRE 2 READER) DEVI To check blood sugars 3-4 times daily due to insulin  use. DX E11.40 2 each 5    Continuous Glucose Sensor (FREESTYLE LIBRE 2 SENSOR) MISC To check blood sugars 3-4 times daily due to insulin  use. DX E11.40 2 each 5    Cyanocobalamin  1000 MCG/ML KIT Inject 1,000 mcg as directed every 30 (thirty) days.      cyclobenzaprine  (FLEXERIL ) 5 MG tablet Take 5 mg by mouth 2 (two) times daily as needed for muscle spasms. Takes very rarely      Fluticasone -Umeclidin-Vilant (TRELEGY ELLIPTA ) 100-62.5-25 MCG/ACT AEPB INHALE 1 PUFF INTO THE LUNGS DAILY -42 DAY EXPIRATION AFTER OPENING FOIL TRAY 180 each 2    folic acid  (FOLVITE ) 1 MG tablet TAKE 1 TABLET EVERY DAY 90 tablet 0    furosemide  (LASIX ) 40 MG tablet Take 0.5 tablets (20 mg total) by mouth daily. 90 tablet 4    gabapentin  (NEURONTIN ) 800 MG tablet Take 1 tablet (800 mg total) by mouth 3 (three) times daily. 270 tablet 4    HYDROcodone -acetaminophen  (NORCO) 10-325 MG tablet Take 1 tablet by mouth every 4 (four) hours as needed.      insulin  lispro (HUMALOG  KWIKPEN) 100 UNIT/ML KwikPen Inject 5 Units into the skin 3 (three) times daily before meals. Do not inject insulin  if you are not going to eat meal.  Only take before a meal. 15 mL 3    losartan  (COZAAR ) 25 MG tablet Take 0.5 tablets (12.5 mg total) by mouth daily. 45 tablet 4    metFORMIN  (GLUCOPHAGE ) 1000 MG tablet Take 1 tablet (1,000 mg total) by mouth 2 (two) times daily with a meal. 180 tablet 4    metoprolol  succinate (TOPROL -XL) 25 MG 24 hr tablet Take  0.5 tablets (12.5 mg total) by mouth daily. 45 tablet 4    montelukast  (SINGULAIR ) 10 MG tablet Take 1 tablet (10 mg total) by mouth at bedtime. 90 tablet 4    naloxone  (NARCAN ) nasal spray 4 mg/0.1 mL Place 1  spray into the nose as needed for up to 365 doses (for opioid-induced respiratory depresssion). In case of emergency (overdose), spray once into each nostril. If no response within 3 minutes, repeat application and call 911. 1 each 1    Nebulizers (EASY AIR COMPRESSOR NEBULIZER) MISC Use to inhaler nebulizer treatments at needed per instructions on nebulizer prescription 1 each 0    ondansetron  (ZOFRAN ) 4 MG tablet Take 1 tablet (4 mg total) by mouth daily as needed for nausea or vomiting. 30 tablet 1    OXYGEN  Inhale 2 L into the lungs at bedtime.      PAIN MANAGEMENT INTRATHECAL, IT, PUMP 1 each by Intrathecal route. Intrathecal (IT) medication:  Morphine  5.0 mg/ml, Bupivacaine 20.0 mg/ml,  Clonidine  100.0 mcg/ml Patient does not remember current. Adjusted every 3 months at [facility]      pantoprazole  (PROTONIX ) 40 MG tablet Take 1 tablet by mouth once daily 90 tablet 0    TRESIBA  FLEXTOUCH 100 UNIT/ML FlexTouch Pen INJECT 55 UNITS SUBCUTANEOUSLY AT BEDTIME 30 mL 0    TRULICITY  4.5 MG/0.5ML SOPN INJECT 4.5MG  INTO THE SKIN ONCE WEEKLY AS DIRECTED 12 mL 0    venlafaxine  XR (EFFEXOR -XR) 150 MG 24 hr capsule Take 1 capsule by mouth once daily 90 capsule 0     Assessment: Pharmacy consulted to dose heparin  in this 73 year old female admitted with respiratory failure, possible PE.   Pt was on Eliquis  5 mg PO BID PTA for Afib.   Unsure of last dose.  CrCl = 36.5 ml/min   1/5 1741 aPTT 55 1/6 0304 aPTT 194,  HL >1.10 1/6 1556 aPTT 59  Subtherapeutic 1/7 0150 aPTT 93, therapeutic x 1  Goal of Therapy:  aPTT : 66 - 102  Heparin  level 0.3-0.7 units/ml Monitor platelets by anticoagulation protocol: Yes   Plan: aPTT is therapeutic but at lower end of normal and down from previous reading (heparin  level not yet correlating) ---increase heparin  infusion rate to 1100 units/hour ---Recheck aPTT level in 8 hours to confirm Check HL daily until correlation w/ aPTT confirmed ---Monitor CBC  and signs/symptoms of bleeding  Thank you for involving pharmacy in this patient's care.   Adriana Bolster, PharmD, BCPS 08/11/2023 7:01 AM

## 2023-08-12 DIAGNOSIS — I2694 Multiple subsegmental pulmonary emboli without acute cor pulmonale: Secondary | ICD-10-CM | POA: Diagnosis not present

## 2023-08-12 DIAGNOSIS — J9621 Acute and chronic respiratory failure with hypoxia: Secondary | ICD-10-CM | POA: Diagnosis not present

## 2023-08-12 DIAGNOSIS — J189 Pneumonia, unspecified organism: Secondary | ICD-10-CM | POA: Diagnosis not present

## 2023-08-12 DIAGNOSIS — J9622 Acute and chronic respiratory failure with hypercapnia: Secondary | ICD-10-CM | POA: Diagnosis not present

## 2023-08-12 LAB — COMPREHENSIVE METABOLIC PANEL
ALT: 28 U/L (ref 0–44)
AST: 16 U/L (ref 15–41)
Albumin: 2.3 g/dL — ABNORMAL LOW (ref 3.5–5.0)
Alkaline Phosphatase: 78 U/L (ref 38–126)
Anion gap: 10 (ref 5–15)
BUN: 24 mg/dL — ABNORMAL HIGH (ref 8–23)
CO2: 23 mmol/L (ref 22–32)
Calcium: 8 mg/dL — ABNORMAL LOW (ref 8.9–10.3)
Chloride: 102 mmol/L (ref 98–111)
Creatinine, Ser: 1.01 mg/dL — ABNORMAL HIGH (ref 0.44–1.00)
GFR, Estimated: 59 mL/min — ABNORMAL LOW (ref >60)
Glucose, Bld: 242 mg/dL — ABNORMAL HIGH (ref 70–99)
Potassium: 3.2 mmol/L — ABNORMAL LOW (ref 3.5–5.1)
Sodium: 135 mmol/L (ref 135–145)
Total Bilirubin: 0.5 mg/dL (ref 0.0–1.2)
Total Protein: 5.8 g/dL — ABNORMAL LOW (ref 6.5–8.1)

## 2023-08-12 LAB — GLUCOSE, CAPILLARY
Glucose-Capillary: 219 mg/dL — ABNORMAL HIGH (ref 70–99)
Glucose-Capillary: 218 mg/dL — ABNORMAL HIGH (ref 70–99)
Glucose-Capillary: 180 mg/dL — ABNORMAL HIGH (ref 70–99)
Glucose-Capillary: 142 mg/dL — ABNORMAL HIGH (ref 70–99)
Glucose-Capillary: 233 mg/dL — ABNORMAL HIGH (ref 70–99)
Glucose-Capillary: 268 mg/dL — ABNORMAL HIGH (ref 70–99)

## 2023-08-12 LAB — CBC
HCT: 25.8 % — ABNORMAL LOW (ref 36.0–46.0)
Hemoglobin: 8.1 g/dL — ABNORMAL LOW (ref 12.0–15.0)
MCH: 28.3 pg (ref 26.0–34.0)
MCHC: 31.4 g/dL (ref 30.0–36.0)
MCV: 90.2 fL (ref 80.0–100.0)
Platelets: 347 10*3/uL (ref 150–400)
RBC: 2.86 MIL/uL — ABNORMAL LOW (ref 3.87–5.11)
RDW: 16.4 % — ABNORMAL HIGH (ref 11.5–15.5)
WBC: 25 10*3/uL — ABNORMAL HIGH (ref 4.0–10.5)
nRBC: 0 % (ref 0.0–0.2)

## 2023-08-12 LAB — MAGNESIUM: Magnesium: 2 mg/dL (ref 1.7–2.4)

## 2023-08-12 LAB — APTT: aPTT: 73 s — ABNORMAL HIGH (ref 24–36)

## 2023-08-12 LAB — POTASSIUM: Potassium: 3.6 mmol/L (ref 3.5–5.1)

## 2023-08-12 LAB — MYCOPLASMA PNEUMONIAE ANTIBODY, IGM: Mycoplasma pneumo IgM: <770 U/mL (ref 0–769)

## 2023-08-12 LAB — HEPARIN LEVEL (UNFRACTIONATED): Heparin Unfractionated: 0.46 [IU]/mL (ref 0.30–0.70)

## 2023-08-12 LAB — PHOSPHORUS: Phosphorus: 3.8 mg/dL (ref 2.5–4.6)

## 2023-08-12 MED ORDER — HYDROMORPHONE HCL 1 MG/ML IJ SOLN: 0.5000 mg | Freq: Once | INTRAMUSCULAR | Status: DC

## 2023-08-12 MED ORDER — MIDAZOLAM HCL 2 MG/2ML IJ SOLN
1.0000 mg | INTRAMUSCULAR | Status: DC | PRN
  Administered 2023-08-12 – 2023-08-15 (×5): 2 mg via INTRAVENOUS
  Filled 2023-08-12 (×5): qty 2

## 2023-08-12 MED ORDER — HYDROMORPHONE BOLUS VIA INFUSION
0.2500 mg | INTRAVENOUS | Status: DC | PRN
  Administered 2023-08-12 (×2): 0.5 mg via INTRAVENOUS
  Administered 2023-08-12: 1 mg via INTRAVENOUS
  Administered 2023-08-12: 0.5 mg via INTRAVENOUS
  Administered 2023-08-12: 1 mg via INTRAVENOUS
  Administered 2023-08-14: 0.75 mg via INTRAVENOUS
  Administered 2023-08-14: 1 mg via INTRAVENOUS

## 2023-08-12 MED ORDER — HYDROMORPHONE HCL-NACL 50-0.9 MG/50ML-% IV SOLN
0.5000 mg/h | INTRAVENOUS | Status: DC
  Administered 2023-08-12: 0.5 mg/h via INTRAVENOUS
  Administered 2023-08-13: 1.5 mg/h via INTRAVENOUS
  Administered 2023-08-14: 1.75 mg/h via INTRAVENOUS
  Filled 2023-08-12 (×3): qty 50

## 2023-08-12 MED ORDER — DOCUSATE SODIUM 50 MG/5ML PO LIQD
100.0000 mg | Freq: Two times a day (BID) | ORAL | Status: DC | PRN
  Administered 2023-08-13 – 2023-08-15 (×3): 100 mg
  Filled 2023-08-12 (×3): qty 10

## 2023-08-12 MED ORDER — INSULIN ASPART 100 UNIT/ML IJ SOLN
4.0000 [IU] | INTRAMUSCULAR | Status: DC
  Administered 2023-08-12 – 2023-08-15 (×15): 4 [IU] via SUBCUTANEOUS
  Filled 2023-08-12 (×15): qty 1

## 2023-08-12 MED ORDER — POTASSIUM CHLORIDE 10 MEQ/100ML IV SOLN
10.0000 meq | INTRAVENOUS | Status: AC
Start: 2023-08-12 — End: 2023-08-12
  Administered 2023-08-12 (×2): 10 meq via INTRAVENOUS
  Filled 2023-08-12 (×2): qty 100

## 2023-08-12 NOTE — Progress Notes (Signed)
 PHARMACY - ANTICOAGULATION CONSULT NOTE  Pharmacy Consult for Heparin   Indication: atrial fibrillation and pulmonary embolus  Patient Measurements: Height: 4' 10.27 (148 cm) Weight: 39.2 kg (86 lb 6.7 oz) IBW/kg (Calculated) : 41.52 Heparin  Dosing Weight: 56.7 kg   Labs: Recent Labs    08/09/23 0456 08/09/23 0744 08/10/23 0304 08/10/23 1556 08/10/23 1717 08/11/23 0150 08/11/23 0153 08/11/23 0936 08/11/23 0937 08/11/23 1949 08/12/23 0407  HGB 10.7*  --  8.5*  --   --   --  8.3*  --  9.8*  --  8.1*  HCT 35.8*  --  27.5*  --   --   --  26.4*  --  30.4*  --  25.8*  PLT 453*  --  379  --   --   --  310  --  329  --  347  APTT  --    < > 194*   < >  --    < >  --  66*  --  82* 73*  HEPARINUNFRC  --    < > >1.10*  --   --   --   --  0.85*  --   --  0.46  CREATININE 1.13*   < > 1.06*  --  1.05*  --  0.98  --   --   --   --   TROPONINIHS 95*  --   --   --   --   --   --   --   --   --   --    < > = values in this interval not displayed.    Estimated Creatinine Clearance: 31.6 mL/min (by C-G formula based on SCr of 0.98 mg/dL).   Medical History: Past Medical History:  Diagnosis Date   Arthritis    Asthma    Atrial fibrillation (HCC)    Breast cancer (HCC) 1998   right breast ca with mastectomy and chemotherapy and radiation   Bronchitis    CHF (congestive heart failure) (HCC)    with Morphine  withdrawal   COPD (chronic obstructive pulmonary disease) (HCC)    Diabetes mellitus without complication (HCC)    Diverticulitis    diverticulosis also   Dyspnea    Endometriosis    GERD (gastroesophageal reflux disease)    History of shingles 2000-2005   Hypercholesteremia    Hypertension    IBS (irritable bowel syndrome)    Low back pain    a. Implanted morphine /bupivicaine/clonidine  pump.   Neuropathy    Orthopnea    Oxygen  dependent    uses at night   Personal history of chemotherapy    Personal history of radiation therapy    Pneumonia    pneumonia 5-6 times,  history of bronchitis also   Scoliosis    Sleep apnea    does not use cpap   Stroke (HCC) 2010   TIA, 10 years ago   Withdrawal from sedative drug (HCC)    withdrawal from morphine  when pump batteries died   Assessment: Pharmacy consulted to dose heparin  in this 73 year old female admitted with respiratory failure, possible PE.   Pt was on Eliquis  5 mg PO BID PTA for Afib.   Unsure of last dose.  1/5 1741 aPTT 55 1/6 0304 aPTT 194,  HL >1.10 1/6 1556 aPTT 59  Subtherapeutic 1/7 0150 aPTT 93, therapeutic x 1 1/7 1949 aPTT 82, therapeutic x 2 1/8 0407 aPTT 73, therapeutic x 3 / HL 0.46 correlating x 1 /  Hgb 9.8 > 8.1  Goal of Therapy:  aPTT : 66 - 102  Heparin  level 0.3-0.7 units/ml Monitor platelets by anticoagulation protocol: Yes   Plan:  --aPTT is therapeutic x 3 --Continue heparin  infusion at 1100 units/hour --Recheck aPTT and HL in 8 hours --Monitor CBC and signs/symptoms of bleeding, Hgb trending down.  Thank you for involving pharmacy in this patient's care.   Rankin CANDIE Dills, PharmD, [facility] 08/12/2023 4:42 AM

## 2023-08-12 NOTE — Progress Notes (Signed)
 NAME:  SHARENA Chan, MRN:  [id number], DOB:  07-13-1951, LOS: 3 ADMISSION DATE:  08/09/2023, CHIEF COMPLAINT:  Acute Hypoxic Respiratory Fialure   History of Present Illness:   73 y.o female with significant PMH of B12 deficiency, chronic pain syndrome s/p intrathecal pump, paroxysmal atrial fibrillation, stress-induced cardiomyopathy in the setting of pain pump malfunction and opioid withdrawal, PE following hospitalization for pneumonia, DM, COPD, GERD, OSA, and IBS who presented to the ED with chief complaints of acute respiratory distress found with sats in the 50 to 60% on RA per EMS.   ED Course: Initial vital signs showed HR of 142 beats/minute, BP mm Hg, the RR 23 breaths/minute, and the oxygen  saturation 100% on BVM and a temperature of 92.29F (33.3C).  Patient was intubated for airway protection. Pertinent Labs/Diagnostics Findings: Na+/ K+: 136/4.2 glucose: 418 BUN/Cr.:  13/1.23 AST/ALT: 97/51, CO2 14, anion gap 20 WBC: 25.2 K/L without bands or neutrophil predominance, Plts: 530 PCT:  Lactic acid: >9.0 COVID PCR: Negative,  troponin: 20  BNP:241.9 ABG: pO2 96; pCO2 51; pH 7.03;  HCO3 13.5, %O2 Sat 96.4.  CXR> see result ED Medications: Patient treated with Duonebs, Solumedrol 125 mg x 1, 30 cc/kg of fluids and started on broad-spectrum antibiotics Vanco cefepime  and Flagyl  for suspected sepsis    Pertinent  Medical History   B12 deficiency, chronic pain syndrome s/p intrathecal pump, paroxysmal atrial fibrillation, stress-induced cardiomyopathy in the setting of pain pump malfunction and opioid withdrawal, PE following hospitalization for pneumonia, DM, COPD, GERD, OSA, and IBS   Significant Hospital Events: Including procedures, antibiotic start and stop dates in addition to other pertinent events   1/5: Admitted to ICU with acute hypoxic hypercapnic respiratory failure secondary to suspected pneumonia and AECOPD. 1/6: failed SBT, hypertensive off sedation. Failed  switch to dexmedetomidine  1/7: failed extubation, had to be re-intubated at the bedside 1/8: switched to full code, switch fentanyl  to hydromorphone   Interim History / Subjective:  Patient is sedated, but does follow commands on lower of sedative dose. Anxious and uncomfortable when awake  Objective   Blood pressure (!) 140/59, pulse (!) 115, temperature 99.5 F (37.5 C), resp. rate 15, height 4' 10.27 (1.48 m), weight 39.2 kg, SpO2 99%.    Vent Mode: PRVC FiO2 (%):  [28 %-50 %] 28 % Set Rate:  [24 bmp] 24 bmp Vt Set:  [400 mL] 400 mL PEEP:  [8 cmH20-10 cmH20] 8 cmH20 Plateau Pressure:  [19 cmH20-26 cmH20] 26 cmH20   Intake/Output Summary (Last 24 hours) at 08/12/2023 1147 Last data filed at 08/12/2023 0700 Gross per 24 hour  Intake 2011.68 ml  Output 1850 ml  Net 161.68 ml   Filed Weights   08/10/23 0400 08/11/23 0430 08/12/23 0400  Weight: 72.8 kg 42.5 kg 39.2 kg    Examination: Physical Exam Constitutional:      General: She is not in acute distress.    Appearance: She is ill-appearing.  HENT:     Head: Normocephalic.     Mouth/Throat:     Comments: ETT in place Cardiovascular:     Rate and Rhythm: Normal rate and regular rhythm.     Pulses: Normal pulses.     Heart sounds: Normal heart sounds.  Pulmonary:     Comments: Ventilated breath sounds bilaterally Neurological:     Mental Status: She is disoriented.     Comments: Sedated, ventilated. Does follow commands off sedation      Assessment & Plan:   Neurology #Toxic Metabolic  Encephalopathy #Chronic Pain   Patient is maintained on an intra-thecal pump with morphine  and clonidine  and bupivacaine. Given pump chronicity, will hold off on making any changes unless absolutely necessary. Patient with significant pain at baseline and is also maintained on buspirone  and gabapentin  which are held for now. She does follow commands when awake (for daily wake up assessment) but is extremely anxious. We will switch from  fentanyl  to hydromorphone  for better pain control.   -Maintain a RASS goal of -1 -Propofol  and fentanyl  to maintain RASS goal and pain control per CPOT < 2   Cardiovascular #Circulatory Shock #Stress induced cardiomyopathy #Paroxysmal afib   Sedation related hypotensive briskly improves (with resultant hypertension during wake up assessment). She's received IV fluids given hypotension which we've held, and will begin to mobilize fluids with diuretics. Sinus tachycardia at baseline maintained on metoprolol , now held. BNP noted to be elevated at 241 suggesting a component of decompensated heart failure.   -on heparin  gtt for PE and afib -received IV furosemide  40 mg IV yesterday, holding today -hold off on GDMT -repeat TTE shows LVEF of 25%   Pulmonary #Acute Hypoxic Respiratory Failure  Noted to have bilateral consolidative opacities, bilateral small layering pleural effusions, as well as bilaterally consolidated/collapsed lower lobes. Effusions are small and simple appearing on POCUS at the bedside, with underlying consolidated lower lobes. Respiratory failure likely secondary to a combination of pneumonia, pulmonary embolism, and decompensated heart failure. Did pass SBT, but failed extubation and had to be re-intubated yesterday. Today her ventilator requirements are minimal, will resume daily SBT tomorrow.   -Full vent support, implement lung protective strategies -Plateau pressures less than 30 cm H20 -Wean FiO2 & PEEP as tolerated to maintain O2 sats >92% -Follow intermittent Chest X-ray & ABG as needed -Implement VAP Bundle -Prn Bronchodilators   Gastrointestinal   PPI for SUP, will initiate tube feeds   Renal #AKI   Kidney function mildly elevated on presentation, improved on repeat this AM. Did receive IV furosemide  with good urine output. Holding off on diuresis today.   Endocrine   ICU glycemic protocol   Hem/Onc #VTE   She is maintained on apixaban  for history  of PE and for afib and is noted to have acute PE on presentation. This is likely secondary to medication non-compliance. Continue heparin  for now, will consider hematology consult prior to discharge.   -Continue heparin  gtt for PE.   ID   #CAP   Continue with broad spectrum antibiotics. Negative viral panel. Negative MRSA screen, negative strep pneumonia antigen. Pending legionella urinary antigen.   -follow up tracheal aspirate -continue cefepime  and azithromycin    Best Practice (right click and Reselect all SmartList Selections daily)   Diet/type: NPO DVT prophylaxis systemic heparin  Pressure ulcer(s): N/A GI prophylaxis: PPI Lines: Central line, Arterial Line, and yes and it is still needed Foley:  Yes, and it is still needed Code Status:  full code Last date of multidisciplinary goals of care discussion [08/12/2023]  Labs   CBC: Recent Labs  Lab 08/09/23 0214 08/09/23 0456 08/10/23 0304 08/11/23 0153 08/11/23 0937 08/12/23 0407  WBC 25.2* 38.0* 26.6* 24.1* 28.4* 25.0*  NEUTROABS 8.2*  --   --   --  23.9*  --   HGB 11.6* 10.7* 8.5* 8.3* 9.8* 8.1*  HCT 41.6 35.8* 27.5* 26.4* 30.4* 25.8*  MCV 101.5* 94.2 91.7 90.1 87.6 90.2  PLT 530* 453* 379 310 329 347    Basic Metabolic Panel: Recent Labs  Lab 08/09/23 0214  08/09/23 0456 08/09/23 2113 08/10/23 0304 08/10/23 1717 08/11/23 0153 08/12/23 0407  NA 136   < > 139 138 137 134* 135  K 4.2   < > 4.4 4.4 4.3 4.0 3.2*  CL 102   < > 107 106 103 102 102  CO2 14*   < > 21* 23 26 24 23   GLUCOSE 418*   < > 190* 136* 150* 113* 242*  BUN 13   < > 22 23 20 19  24*  CREATININE 1.23*   < > 1.13* 1.06* 1.05* 0.98 1.01*  CALCIUM  8.7*   < > 7.2* 7.1* 7.4* 7.5* 8.0*  MG 2.3  --   --  1.3*  --  2.1 2.0  PHOS  --   --   --  3.4  --  3.3 3.8   < > = values in this interval not displayed.   GFR: Estimated Creatinine Clearance: 30.7 mL/min (A) (by C-G formula based on SCr of 1.01 mg/dL (H)). Recent Labs  Lab 08/09/23 0456  08/09/23 0942 08/09/23 1259 08/10/23 0304 08/10/23 1717 08/11/23 0153 08/11/23 0937 08/12/23 0407  PROCALCITON 0.33  --   --   --   --   --  3.10  --   WBC 38.0*  --   --  26.6*  --  24.1* 28.4* 25.0*  LATICACIDVEN 5.5* 4.2* 4.6*  --  1.0  --   --   --     Liver Function Tests: Recent Labs  Lab 08/09/23 0214 08/10/23 0304 08/11/23 0153 08/12/23 0407  AST 97* 38 23 16  ALT 51* 53* 37 28  ALKPHOS 129* 80 71 78  BILITOT 0.3 0.3 0.4 0.5  PROT 6.7 5.0* 5.6* 5.8*  ALBUMIN 2.9* 2.4* 2.3* 2.3*   No results for input(s): LIPASE, AMYLASE in the last 168 hours. No results for input(s): AMMONIA in the last 168 hours.  ABG    Component Value Date/Time   PHART 7.38 08/11/2023 1046   PCO2ART 42 08/11/2023 1046   PO2ART 190 (H) 08/11/2023 1046   HCO3 24.8 08/11/2023 1046   ACIDBASEDEF 0.4 08/11/2023 1046   O2SAT 99.4 08/11/2023 1046     Coagulation Profile: Recent Labs  Lab 08/09/23 0214  INR 1.4*    Cardiac Enzymes: No results for input(s): CKTOTAL, CKMB, CKMBINDEX, TROPONINI in the last 168 hours.  HbA1C: Hemoglobin A1C  Date/Time Value Ref Range Status  04/28/2016 12:00 AM 7.7%  Final   HB A1C (BAYER DCA - WAIVED)  Date/Time Value Ref Range Status  06/12/2023 09:28 AM 7.4 (H) 4.8 - 5.6 % Final    Comment:             Prediabetes: 5.7 - 6.4          Diabetes: >6.4          Glycemic control for adults with diabetes: <7.0   02/17/2023 01:36 PM 7.5 (H) 4.8 - 5.6 % Final    Comment:             Prediabetes: 5.7 - 6.4          Diabetes: >6.4          Glycemic control for adults with diabetes: <7.0    Hgb A1c MFr Bld  Date/Time Value Ref Range Status  04/16/2023 05:22 AM 6.7 (H) 4.8 - 5.6 % Final    Comment:    (NOTE)         Prediabetes: 5.7 - 6.4  Diabetes: >6.4         Glycemic control for adults with diabetes: <7.0     CBG: Recent Labs  Lab 08/11/23 1602 08/11/23 1920 08/11/23 2325 08/12/23 0312 08/12/23 0759  GLUCAP 147*  186* 222* 219* 218*    Review of Systems:   Unable to obtain  Past Medical History:  She,  has a past medical history of Arthritis, Asthma, Atrial fibrillation (HCC), Breast cancer (HCC) (1998), Bronchitis, CHF (congestive heart failure) (HCC), COPD (chronic obstructive pulmonary disease) (HCC), Diabetes mellitus without complication (HCC), Diverticulitis, Dyspnea, Endometriosis, GERD (gastroesophageal reflux disease), History of shingles (2000-2005), Hypercholesteremia, Hypertension, IBS (irritable bowel syndrome), Low back pain, Neuropathy, Orthopnea, Oxygen  dependent, Personal history of chemotherapy, Personal history of radiation therapy, Pneumonia, Scoliosis, Sleep apnea, Stroke (HCC) (2010), and 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   Surgical History:   Past Surgical History:  Procedure Laterality Date   ABDOMINAL HYSTERECTOMY  1987   BACK SURGERY     Tailbone removed following fracture   BREAST SURGERY Right    mastectomy   CARDIAC CATHETERIZATION     CATARACT EXTRACTION W/PHACO Right 05/19/2019   Procedure: CATARACT EXTRACTION PHACO AND INTRAOCULAR LENS PLACEMENT (IOC), RIGHT, DIABETIC;  Surgeon: Ferol Rogue, MD;  Location: [facility] ORS;  Service: Ophthalmology;  Laterality: Right;  Lot # [id number] H US : 00:43.8 CDE: 4.59   CATARACT EXTRACTION W/PHACO Left 06/16/2019   Procedure: CATARACT EXTRACTION PHACO AND INTRAOCULAR LENS PLACEMENT (IOC) LEFT VISION BLUE DIABETIC;  Surgeon: Ferol Rogue, MD;  Location: [facility] ORS;  Service: Ophthalmology;  Laterality: Left;  Lot #[id number] H US : 00:46.9 CDE: 6.53   COCCYX REMOVAL     COLONOSCOPY WITH PROPOFOL  N/A 01/01/2017   Procedure: COLONOSCOPY WITH PROPOFOL ;  Surgeon: Therisa Bi, MD;  Location: [facility] ENDOSCOPY;  Service: Endoscopy;  Laterality: N/A;   COLONOSCOPY WITH PROPOFOL  N/A 12/11/2021   Procedure: COLONOSCOPY WITH PROPOFOL ;  Surgeon: Unk Corinn Skiff, MD;  Location: [facility] ENDOSCOPY;  Service: Gastroenterology;  Laterality: N/A;    ELBOW ARTHROSCOPY WITH TENDON RECONSTRUCTION     ESOPHAGOGASTRODUODENOSCOPY (EGD) WITH PROPOFOL  N/A 01/01/2017   Procedure: ESOPHAGOGASTRODUODENOSCOPY (EGD) WITH PROPOFOL ;  Surgeon: Therisa Bi, MD;  Location: [facility] ENDOSCOPY;  Service: Endoscopy;  Laterality: N/A;   ESOPHAGOGASTRODUODENOSCOPY (EGD) WITH PROPOFOL  N/A 12/11/2021   Procedure: ESOPHAGOGASTRODUODENOSCOPY (EGD) WITH PROPOFOL ;  Surgeon: Unk Corinn Skiff, MD;  Location: [facility] ENDOSCOPY;  Service: Gastroenterology;  Laterality: N/A;   INTRATHECAL PUMP IMPLANT     LEFT HEART CATH AND CORONARY ANGIOGRAPHY N/A 04/28/2017   Procedure: LEFT HEART CATH AND CORONARY ANGIOGRAPHY;  Surgeon: Mady Bruckner, MD;  Location: [facility] INVASIVE CV LAB;  Service: Cardiovascular;  Laterality: N/A;   MASTECTOMY Right 06/1997   morphine  pump  2011   PLANTAR FASCIA RELEASE     TOTAL HIP ARTHROPLASTY Left    TRIGGER FINGER RELEASE       Social History:   reports that she quit smoking about 19 years ago. Her smoking use included cigarettes. She started smoking about 49 years ago. She has a 30 pack-year smoking history. She has never used smokeless tobacco. She reports that she does not drink alcohol  and does not use drugs.   Family History:  Her family history includes Arthritis in her paternal grandfather; Cancer in her paternal grandmother and sister; Cancer (age of onset: 48) in her father; Cancer (age of onset: 20) in her mother; Hyperlipidemia in her sister and son; Hypertension in her sister; Osteoporosis in her maternal grandmother; Seizures in her son; Thyroid  disease in her mother. There is  no history of Breast cancer.   Allergies Allergies  Allergen Reactions   Other Palpitations    IV steroids   Pain Patch [Menthol] Anaphylaxis   Avelox  [Moxifloxacin  Hcl In Nacl] Other (See Comments)    Upset stomach   Doxycycline  Diarrhea   Erythromycin  Nausea Only and Other (See Comments)    Can take a Z-Pak just fine Other reaction(s): Other (See  Comments) Can take Z-Pak  Can take a Z-Pak just fine   Fentanyl  Nausea Only and Rash   Moxifloxacin  Hcl Other (See Comments)    Upset stomach Upset stomach   Oxycontin [Oxycodone] Hives   Ozempic [Semaglutide] Nausea Only     Home Medications  Prior to Admission medications   Medication Sig Start Date End Date Taking? Authorizing Provider  ACCU-CHEK AVIVA PLUS test strip TEST THREE TIMES DAILY 05/11/21   [name], Jolene T, NP  Accu-Chek Softclix Lancets lancets TEST BLOOD SUGAR THREE TIMES DAILY 02/26/23   [name], Jolene T, NP  albuterol  (PROVENTIL  HFA;VENTOLIN  HFA) 108 (90 BASE) MCG/ACT inhaler Inhale 2 puffs into the lungs every 6 (six) hours as needed for wheezing or shortness of breath.     [provider]  albuterol  (PROVENTIL ) (2.5 MG/3ML) 0.083% nebulizer solution Take 3 mLs (2.5 mg total) by nebulization every 4 (four) hours as needed for wheezing or shortness of breath. 07/18/22 08/04/23  Raenelle Coria, MD  Alcohol  Swabs (DROPSAFE ALCOHOL  PREP) 70 % PADS USE TWICE DAILY  WITH  SUGAR  CHECKS 11/14/21   [name], Jolene T, NP  apixaban  (ELIQUIS ) 5 MG TABS tablet Take 1 tablet (5 mg total) by mouth 2 (two) times daily. 06/24/22   [name], Jolene T, NP  atorvastatin  (LIPITOR) 40 MG tablet Take 1 tablet by mouth once daily 06/08/23   [name], Jolene T, NP  Blood Glucose Monitoring Suppl (TRUE METRIX METER) w/Device KIT Use to check blood sugar 4 times a day 05/14/21   [name], Jolene T, NP  busPIRone  (BUSPAR ) 5 MG tablet Take 1 tablet by mouth twice daily 06/02/23   [name], Jolene T, NP  cholecalciferol  (VITAMIN D3) 25 MCG (1000 UNIT) tablet Take 1,000 Units by mouth daily.    [provider]  Continuous Glucose Receiver (FREESTYLE LIBRE 2 READER) DEVI To check blood sugars 3-4 times daily due to insulin  use. DX E11.40 12/11/22   [name], Melanie T, NP  Continuous Glucose Sensor (FREESTYLE LIBRE 2 SENSOR) MISC To check blood sugars 3-4 times daily due to insulin  use. DX  E11.40 12/30/22   [name], Melanie T, NP  Cyanocobalamin  1000 MCG/ML KIT Inject 1,000 mcg as directed every 30 (thirty) days.    [provider]  cyclobenzaprine  (FLEXERIL ) 5 MG tablet Take 5 mg by mouth 2 (two) times daily as needed for muscle spasms. Takes very rarely    [provider]  Fluticasone -Umeclidin-Vilant (TRELEGY ELLIPTA ) 100-62.5-25 MCG/ACT AEPB INHALE 1 PUFF INTO THE LUNGS DAILY -42 DAY EXPIRATION AFTER OPENING FOIL TRAY 02/13/23   [name], Jolene T, NP  folic acid  (FOLVITE ) 1 MG tablet TAKE 1 TABLET EVERY DAY 10/14/21   Dasie Tinnie MATSU, NP  furosemide  (LASIX ) 40 MG tablet Take 0.5 tablets (20 mg total) by mouth daily. 07/27/23   [name], Jolene T, NP  gabapentin  (NEURONTIN ) 800 MG tablet Take 1 tablet (800 mg total) by mouth 3 (three) times daily. 06/12/23   [name], Jolene T, NP  HYDROcodone -acetaminophen  (NORCO) 10-325 MG tablet Take 1 tablet by mouth every 4 (four) hours as needed.    [provider]  insulin  lispro (HUMALOG  KWIKPEN) 100 UNIT/ML KwikPen Inject 5 Units into the skin 3 (three) times daily before meals. Do not inject insulin  if you are not going to eat meal.  Only take before a meal. 12/11/22   [name], Jolene T, NP  losartan  (COZAAR ) 25 MG tablet Take 0.5 tablets (12.5 mg total) by mouth daily. 06/12/23   [name], Jolene T, NP  metFORMIN  (GLUCOPHAGE ) 1000 MG tablet Take 1 tablet (1,000 mg total) by mouth 2 (two) times daily with a meal. 06/12/23   [name], Jolene T, NP  metoprolol  succinate (TOPROL -XL) 25 MG 24 hr tablet Take 0.5 tablets (12.5 mg total) by mouth daily. 06/12/23   [name], Jolene T, NP  montelukast  (SINGULAIR ) 10 MG tablet Take 1 tablet (10 mg total) by mouth at bedtime. 06/12/23   [name], Jolene T, NP  naloxone  (NARCAN ) nasal spray 4 mg/0.1 mL Place 1 spray into the nose as needed for up to 365 doses (for opioid-induced respiratory depresssion). In case of emergency (overdose), spray once into each nostril. If no response within 3  minutes, repeat application and call 911. 08/04/23 08/03/24  Tanya Glisson, MD  Nebulizers (EASY AIR COMPRESSOR NEBULIZER) MISC Use to inhaler nebulizer treatments at needed per instructions on nebulizer prescription 07/21/22   [name], Jolene T, NP  ondansetron  (ZOFRAN ) 4 MG tablet Take 1 tablet (4 mg total) by mouth daily as needed for nausea or vomiting. 03/14/23 03/13/24  Cyrena Mylar, MD  OXYGEN  Inhale 2 L into the lungs at bedtime.    [provider]  PAIN MANAGEMENT INTRATHECAL, IT, PUMP 1 each by Intrathecal route. Intrathecal (IT) medication:  Morphine  5.0 mg/ml, Bupivacaine 20.0 mg/ml,  Clonidine  100.0 mcg/ml Patient does not remember current. Adjusted every 3 months at [facility]    [provider]  pantoprazole  (PROTONIX ) 40 MG tablet Take 1 tablet by mouth once daily 05/26/23   [name], Jolene T, NP  TRESIBA  FLEXTOUCH 100 UNIT/ML FlexTouch Pen INJECT 55 UNITS SUBCUTANEOUSLY AT BEDTIME 06/15/23   [name], Jolene T, NP  TRULICITY  4.5 MG/0.5ML SOPN INJECT 4.5MG  INTO THE SKIN ONCE WEEKLY AS DIRECTED 05/01/23   [name], Jolene T, NP  venlafaxine  XR (EFFEXOR -XR) 150 MG 24 hr capsule Take 1 capsule by mouth once daily 07/28/23   [name], Jolene T, NP     Critical care time:  38 minutes    [name], MD [facility] 08/12/2023 2:48 PM

## 2023-08-12 NOTE — Inpatient Diabetes Management (Addendum)
 Inpatient Diabetes Program Recommendations  AACE/ADA: New Consensus Statement on Inpatient Glycemic Control   Target Ranges:  Prepandial:   less than 140 mg/dL      Peak postprandial:   less than 180 mg/dL (1-2 hours)      Critically ill patients:  140 - 180 mg/dL    Latest Reference Range & Units 08/11/23 07:38 08/11/23 11:31 08/11/23 16:02 08/11/23 19:20 08/11/23 23:25 08/12/23 03:12 08/12/23 07:59  Glucose-Capillary 70 - 99 mg/dL 97 849 (H) 852 (H) 813 (H) 222 (H) 219 (H) 218 (H)   Review of Glycemic Control  Diabetes history: DM2 Outpatient Diabetes medications: Tresiba  55 units at bedtime, Humalog  5 units TID with meals, Metformin  1000 mg BID, Trulicity  4.5 mg Qweek, FreeStyle Libre2 CGM Current orders for Inpatient glycemic control: Novolog  0-20 units Q4H; Solucortef 50 mg Q6H, Vital @ 55 ml/hr  Inpatient Diabetes Program Recommendations:    Insulin : Please consider ordering Novolog  4 units Q4H for tube feeding coverage. If tube feeding is stopped or held then Novolog  tube feeding coverage should also be stopped or held.  IF CBGs remain consistently over 180 mg/dl with added tube feeding coverage and steroids are continued, consider ordering Semglee  10 units Q24H.  Thanks, Earnie Gainer, RN, MSN, CDCES Diabetes Coordinator Inpatient Diabetes Program [phone] (Team Pager from 8am to 5pm)

## 2023-08-12 NOTE — IPAL (Signed)
  Interdisciplinary Goals of Care Family Meeting   Date carried out: 08/12/2023  Location of the meeting: Conference room  Member's involved: Physician and Family Member or next of kin  Durable Power of Attorney or acting medical decision maker: Daughter, Jordan Chan  Discussion: We discussed goals of care for Jordan Chan. Went over general medical condition with respiratory failure and sedation needs/goals. Reviewed plan moving forward, including continued antibiotics in addition to switch from fentanyl  to hydromorphone , and re-attempt SBT later in the week. Daughter wanted to re-visit code status, explaining that she felt pressured into switching to DNR status. Explained that given critical illness her chances of recovery after an arrest would significantly diminish. Overall agreed to switch back to full code, and will re-evaluate her condition on a daily basis.  Code status:   Code Status: Full Code   Disposition: Continue current acute care  Time spent for the meeting: 25 minutes    [name], MD  08/12/2023, 11:49 AM

## 2023-08-12 NOTE — TOC CM/SW Note (Signed)
 Transition of Care [facility]) - Inpatient Brief Assessment   Patient Details  Name: NEA GITTENS MRN: [id number] Date of Birth: 1951/03/04  Transition of Care [facility]) CM/SW Contact:    Silvano Molt, LCSW Phone Number: 08/12/2023, 1:18 PM   Clinical Narrative:  CSW completed chart review. Pt is currently on ventilator and not appropriate for assessment at this time. TOC will continue to follow.   Transition of Care Asessment: Insurance and Status: Insurance coverage has been reviewed Patient has primary care physician: Yes Home environment has been reviewed: [facility] Prior level of function:: Not documented at this time Prior/Current Home Services: No current home services Social Drivers of Health Review: SDOH reviewed no interventions necessary Readmission risk has been reviewed: No (Pt currently on ventilator. Re-admission risk will be review when appropriate) Transition of care needs: transition of care needs identified, TOC will continue to follow

## 2023-08-12 NOTE — Progress Notes (Signed)
 PHARMACY CONSULT NOTE - FOLLOW UP  Pharmacy Consult for Electrolyte Monitoring and Replacement   Recent Labs: Potassium (mmol/L)  Date Value  08/12/2023 3.2 (L)  09/14/2013 3.5   Magnesium  (mg/dL)  Date Value  98/91/7974 2.0   Calcium  (mg/dL)  Date Value  98/91/7974 8.0 (L)   Calcium , Total (mg/dL)  Date Value  97/88/7984 9.0   Albumin (g/dL)  Date Value  98/91/7974 2.3 (L)  06/17/2023 4.2  09/14/2013 3.2 (L)   Phosphorus (mg/dL)  Date Value  98/91/7974 3.8   Sodium (mmol/L)  Date Value  08/12/2023 135  06/17/2023 138  09/14/2013 133 (L)     Assessment: 72 YOF w/ PMH of CHF , chronic pain syndrome with intrathecal pump, breast cancer hx, COPD, DM, recurrent exacerbation of COPD now coming in with shock due to sepsis with pneumonia and bilateral PE. Pharmacy is asked to follow and replace electrolytes while in CCU  Goal of Therapy:  Electrolytes WNL  Plan:  ---10 mEq iv KCl x 2 ---recheck potassium at 1500 ---recheck electrolytes in am  [name] ,PharmD Clinical Pharmacist 08/12/2023 7:27 AM

## 2023-08-13 ENCOUNTER — Inpatient Hospital Stay: Payer: Medicare HMO

## 2023-08-13 DIAGNOSIS — I2694 Multiple subsegmental pulmonary emboli without acute cor pulmonale: Secondary | ICD-10-CM | POA: Diagnosis not present

## 2023-08-13 DIAGNOSIS — J9621 Acute and chronic respiratory failure with hypoxia: Secondary | ICD-10-CM | POA: Diagnosis not present

## 2023-08-13 DIAGNOSIS — J9622 Acute and chronic respiratory failure with hypercapnia: Secondary | ICD-10-CM | POA: Diagnosis not present

## 2023-08-13 DIAGNOSIS — J189 Pneumonia, unspecified organism: Secondary | ICD-10-CM | POA: Diagnosis not present

## 2023-08-13 LAB — GLUCOSE, CAPILLARY
Glucose-Capillary: 287 mg/dL — ABNORMAL HIGH (ref 70–99)
Glucose-Capillary: 255 mg/dL — ABNORMAL HIGH (ref 70–99)
Glucose-Capillary: 234 mg/dL — ABNORMAL HIGH (ref 70–99)
Glucose-Capillary: 208 mg/dL — ABNORMAL HIGH (ref 70–99)
Glucose-Capillary: 192 mg/dL — ABNORMAL HIGH (ref 70–99)
Glucose-Capillary: 210 mg/dL — ABNORMAL HIGH (ref 70–99)

## 2023-08-13 LAB — COMPREHENSIVE METABOLIC PANEL
ALT: 23 U/L (ref 0–44)
AST: 11 U/L — ABNORMAL LOW (ref 15–41)
Albumin: 2.3 g/dL — ABNORMAL LOW (ref 3.5–5.0)
Alkaline Phosphatase: 71 U/L (ref 38–126)
Anion gap: 9 (ref 5–15)
BUN: 29 mg/dL — ABNORMAL HIGH (ref 8–23)
CO2: 24 mmol/L (ref 22–32)
Calcium: 8.3 mg/dL — ABNORMAL LOW (ref 8.9–10.3)
Chloride: 107 mmol/L (ref 98–111)
Creatinine, Ser: 0.99 mg/dL (ref 0.44–1.00)
GFR, Estimated: >60 mL/min (ref >60)
Glucose, Bld: 327 mg/dL — ABNORMAL HIGH (ref 70–99)
Potassium: 3.3 mmol/L — ABNORMAL LOW (ref 3.5–5.1)
Sodium: 140 mmol/L (ref 135–145)
Total Bilirubin: 0.4 mg/dL (ref 0.0–1.2)
Total Protein: 5.8 g/dL — ABNORMAL LOW (ref 6.5–8.1)

## 2023-08-13 LAB — APTT
aPTT: 50 s — ABNORMAL HIGH (ref 24–36)
aPTT: 70 s — ABNORMAL HIGH (ref 24–36)

## 2023-08-13 LAB — CBC
HCT: 23.3 % — ABNORMAL LOW (ref 36.0–46.0)
Hemoglobin: 7.4 g/dL — ABNORMAL LOW (ref 12.0–15.0)
MCH: 28.7 pg (ref 26.0–34.0)
MCHC: 31.8 g/dL (ref 30.0–36.0)
MCV: 90.3 fL (ref 80.0–100.0)
Platelets: 339 10*3/uL (ref 150–400)
RBC: 2.58 MIL/uL — ABNORMAL LOW (ref 3.87–5.11)
RDW: 16.8 % — ABNORMAL HIGH (ref 11.5–15.5)
WBC: 17.8 10*3/uL — ABNORMAL HIGH (ref 4.0–10.5)
nRBC: 0 % (ref 0.0–0.2)

## 2023-08-13 LAB — MAGNESIUM: Magnesium: 2.2 mg/dL (ref 1.7–2.4)

## 2023-08-13 LAB — PHOSPHORUS: Phosphorus: 3.1 mg/dL (ref 2.5–4.6)

## 2023-08-13 LAB — HEPARIN LEVEL (UNFRACTIONATED): Heparin Unfractionated: 0.35 [IU]/mL (ref 0.30–0.70)

## 2023-08-13 MED ORDER — POTASSIUM CHLORIDE 10 MEQ/50ML IV SOLN
10.0000 meq | INTRAVENOUS | Status: AC
  Administered 2023-08-13 (×4): 10 meq via INTRAVENOUS
  Filled 2023-08-13 (×4): qty 50

## 2023-08-13 MED ORDER — HEPARIN BOLUS VIA INFUSION
1700.0000 [IU] | Freq: Once | INTRAVENOUS | Status: AC
  Administered 2023-08-13: 1700 [IU] via INTRAVENOUS
  Filled 2023-08-13: qty 1700

## 2023-08-13 MED ORDER — POTASSIUM CHLORIDE 20 MEQ PO PACK
40.0000 meq | PACK | Freq: Once | ORAL | Status: AC
  Administered 2023-08-13: 40 meq
  Filled 2023-08-13: qty 2

## 2023-08-13 MED ORDER — POLYVINYL ALCOHOL 1.4 % OP SOLN: 2.0000 [drp] | OPHTHALMIC | Status: DC | PRN

## 2023-08-13 MED ORDER — INSULIN ASPART 100 UNIT/ML IJ SOLN
5.0000 [IU] | Freq: Once | INTRAMUSCULAR | Status: AC
  Administered 2023-08-13: 5 [IU] via SUBCUTANEOUS

## 2023-08-13 MED ORDER — FUROSEMIDE 10 MG/ML IJ SOLN
40.0000 mg | Freq: Once | INTRAMUSCULAR | Status: AC
  Administered 2023-08-13: 40 mg via INTRAVENOUS
  Filled 2023-08-13: qty 4

## 2023-08-13 MED ORDER — INSULIN GLARGINE-YFGN 100 UNIT/ML ~~LOC~~ SOLN
10.0000 [IU] | Freq: Every day | SUBCUTANEOUS | Status: DC
  Administered 2023-08-13 – 2023-08-14 (×2): 10 [IU] via SUBCUTANEOUS
  Filled 2023-08-13 (×2): qty 0.1

## 2023-08-13 NOTE — Progress Notes (Signed)
 Family updated at bedside by MD. All questions answered at this time within nursing scope. Family offering appropriate support for patient.

## 2023-08-13 NOTE — Progress Notes (Signed)
 1031: SBT began, propofol  reduced to 50% to 25, Dilaudid  down to 1 from 1.5. Family at bedside offering patient support. RT at bedside monitoring patient.   1053: SBT stopped as patient had increased WOB, agitation demonstrated by flailing arms, rapid increase in BP, unable to follow commands. Oxygen  saturation remained above 95% throughout. RT back to room to transition patient back to full vent support. Propofol  resumed at 50 and dilaudid  maintained at 1. Family remained at bedside throughout offering patient appropriate support. MD/NP notified of SBT results.

## 2023-08-13 NOTE — Plan of Care (Signed)
  Problem: Education: Goal: Knowledge of General Education information will improve Description: Including pain rating scale, medication(s)/side effects and non-pharmacologic comfort measures Outcome: Progressing   Problem: Clinical Measurements: Goal: Ability to maintain clinical measurements within normal limits will improve Outcome: Progressing Goal: Will remain free from infection Outcome: Progressing Goal: Diagnostic test results will improve Outcome: Progressing Goal: Respiratory complications will improve Outcome: Progressing   Problem: Nutrition: Goal: Adequate nutrition will be maintained Outcome: Progressing   Problem: Coping: Goal: Level of anxiety will decrease Outcome: Progressing   Problem: Pain Management: Goal: General experience of comfort will improve Outcome: Progressing   Problem: Safety: Goal: Ability to remain free from injury will improve Outcome: Progressing   Problem: Nutritional: Goal: Maintenance of adequate nutrition will improve Outcome: Progressing   Problem: Skin Integrity: Goal: Risk for impaired skin integrity will decrease Outcome: Progressing   Problem: Urinary Elimination: Goal: Ability to achieve and maintain adequate renal perfusion and functioning will improve Outcome: Progressing

## 2023-08-13 NOTE — Progress Notes (Signed)
 NAME:  Jordan Chan, MRN:  [id number], DOB:  1951/07/12, LOS: 4 ADMISSION DATE:  08/09/2023, CHIEF COMPLAINT:  Acute Hypoxic Respiratory Fialure   History of Present Illness:   73 y.o female with significant PMH of B12 deficiency, chronic pain syndrome s/p intrathecal pump, paroxysmal atrial fibrillation, stress-induced cardiomyopathy in the setting of pain pump malfunction and opioid withdrawal, PE following hospitalization for pneumonia, DM, COPD, GERD, OSA, and IBS who presented to the ED with chief complaints of acute respiratory distress found with sats in the 50 to 60% on RA per EMS.   ED Course: Initial vital signs showed HR of 142 beats/minute, BP mm Hg, the RR 23 breaths/minute, and the oxygen  saturation 100% on BVM and a temperature of 92.76F (33.3C).  Patient was intubated for airway protection. Pertinent Labs/Diagnostics Findings: Na+/ K+: 136/4.2 glucose: 418 BUN/Cr.:  13/1.23 AST/ALT: 97/51, CO2 14, anion gap 20 WBC: 25.2 K/L without bands or neutrophil predominance, Plts: 530 PCT:  Lactic acid: >9.0 COVID PCR: Negative,  troponin: 20  BNP:241.9 ABG: pO2 96; pCO2 51; pH 7.03;  HCO3 13.5, %O2 Sat 96.4.  CXR> see result ED Medications: Patient treated with Duonebs, Solumedrol 125 mg x 1, 30 cc/kg of fluids and started on broad-spectrum antibiotics Vanco cefepime  and Flagyl  for suspected sepsis    Pertinent  Medical History   B12 deficiency, chronic pain syndrome s/p intrathecal pump, paroxysmal atrial fibrillation, stress-induced cardiomyopathy in the setting of pain pump malfunction and opioid withdrawal, PE following hospitalization for pneumonia, DM, COPD, GERD, OSA, and IBS   Significant Hospital Events: Including procedures, antibiotic start and stop dates in addition to other pertinent events   1/5: Admitted to ICU with acute hypoxic hypercapnic respiratory failure secondary to suspected pneumonia and AECOPD. 1/6: failed SBT, hypertensive off sedation. Failed  switch to dexmedetomidine  1/7: failed extubation, had to be re-intubated at the bedside 1/8: switched to full code, switch fentanyl  to hydromorphone  1/9: remains on mechanical ventilation, better sedated  Interim History / Subjective:  Sedated, unresponsive  Objective   Blood pressure (!) 130/45, pulse 66, temperature 99.9 F (37.7 C), resp. rate (!) 24, height 4' 10.27 (1.48 m), weight 69.6 kg, SpO2 99%.    Vent Mode: PRVC FiO2 (%):  [28 %] 28 % Set Rate:  [24 bmp] 24 bmp Vt Set:  [400 mL] 400 mL PEEP:  [8 cmH20] 8 cmH20 Plateau Pressure:  [18 cmH20-19 cmH20] 19 cmH20   Intake/Output Summary (Last 24 hours) at 08/13/2023 0759 Last data filed at 08/13/2023 0400 Gross per 24 hour  Intake 2673.26 ml  Output 1505 ml  Net 1168.26 ml   Filed Weights   08/11/23 0430 08/12/23 0400 08/13/23 0330  Weight: 42.5 kg 39.2 kg 69.6 kg    Examination: Physical Exam Constitutional:      General: She is not in acute distress.    Appearance: She is ill-appearing.  HENT:     Head: Normocephalic.     Mouth/Throat:     Comments: ETT in place Cardiovascular:     Rate and Rhythm: Normal rate and regular rhythm.     Pulses: Normal pulses.     Heart sounds: Normal heart sounds.  Pulmonary:     Comments: Ventilated breath sounds bilaterally Neurological:     Mental Status: She is disoriented.     Comments: Sedated, ventilated. Does follow commands off sedation      Assessment & Plan:   Neurology #Toxic Metabolic Encephalopathy #Chronic Pain   Patient is maintained on an  intra-thecal pump with morphine  and clonidine  and bupivacaine. Given pump chronicity, will hold off on making any changes unless absolutely necessary. Patient with significant pain at baseline and is also maintained on buspirone  and gabapentin  which are held for now. She does follow commands when awake (for daily wake up assessment) but was extremely anxious. We have switched from fentanyl  to hydromorphone  with improved  pain control.   -Maintain a RASS goal of -1 -Propofol  and hydromorphone  to maintain RASS goal and pain control per CPOT < 2   Cardiovascular #Circulatory Shock #Stress induced cardiomyopathy #Paroxysmal afib   Sedation related hypotensive briskly improves (with resultant hypertension during wake up assessment). She's received IV fluids given hypotension which we've held, and we have mobilized fluids with diuretics. Sinus tachycardia at baseline maintained on metoprolol , now held. BNP noted to be elevated at 241 suggesting a component of decompensated heart failure.   -on heparin  gtt for PE and afib -repeat furosemide  40 mg IV once -hold off on GDMT -repeat TTE shows LVEF of 25%   Pulmonary #Acute Hypoxic Respiratory Failure  Noted to have bilateral consolidative opacities, bilateral small layering pleural effusions, as well as bilaterally consolidated/collapsed lower lobes. Effusions are small and simple appearing on POCUS at the bedside, with underlying consolidated lower lobes. Respiratory failure likely secondary to a combination of pneumonia, pulmonary embolism, and decompensated heart failure. Did pass SBT, but failed extubation and had to be re-intubated. Today her ventilator requirements are minimal, will resume daily SBT.   -Full vent support, implement lung protective strategies -Plateau pressures less than 30 cm H20 -Wean FiO2 & PEEP as tolerated to maintain O2 sats >92% -Follow intermittent Chest X-ray & ABG as needed -Implement VAP Bundle -Prn Bronchodilators   Gastrointestinal   PPI for SUP, on tube feeds   Renal #AKI   Kidney function mildly elevated on presentation, improved on repeat this AM. Did receive IV furosemide  with good urine output.   -repeat furosemide  40 mg IV once today   Endocrine   ICU glycemic protocol   Hem/Onc #VTE   She is maintained on apixaban  for history of PE and for afib and is noted to have acute PE on presentation. This is likely  secondary to medication non-compliance. Continue heparin  for now, will consider hematology consult prior to discharge.   -Continue heparin  gtt for PE.   ID   #CAP   Continue with broad spectrum antibiotics. Negative viral panel. Negative MRSA screen, negative strep pneumonia antigen. Pending legionella urinary antigen.   -follow up tracheal aspirate -continue cefepime  and azithromycin    Best Practice (right click and Reselect all SmartList Selections daily)   Diet/type: NPO DVT prophylaxis systemic heparin  Pressure ulcer(s): N/A GI prophylaxis: PPI Lines: Central line, Arterial Line, and yes and it is still needed Foley:  Yes, and it is still needed Code Status:  full code Last date of multidisciplinary goals of care discussion [08/13/2023]  Labs   CBC: Recent Labs  Lab 08/09/23 0214 08/09/23 0456 08/10/23 0304 08/11/23 0153 08/11/23 0937 08/12/23 0407 08/13/23 0405  WBC 25.2*   < > 26.6* 24.1* 28.4* 25.0* 17.8*  NEUTROABS 8.2*  --   --   --  23.9*  --   --   HGB 11.6*   < > 8.5* 8.3* 9.8* 8.1* 7.4*  HCT 41.6   < > 27.5* 26.4* 30.4* 25.8* 23.3*  MCV 101.5*   < > 91.7 90.1 87.6 90.2 90.3  PLT 530*   < > 379 310 329 347 339   < > =  values in this interval not displayed.    Basic Metabolic Panel: Recent Labs  Lab 08/09/23 0214 08/09/23 0456 08/10/23 0304 08/10/23 1717 08/11/23 0153 08/12/23 0407 08/12/23 1544 08/13/23 0405  NA 136   < > 138 137 134* 135  --  140  K 4.2   < > 4.4 4.3 4.0 3.2* 3.6 3.3*  CL 102   < > 106 103 102 102  --  107  CO2 14*   < > 23 26 24 23   --  24  GLUCOSE 418*   < > 136* 150* 113* 242*  --  327*  BUN 13   < > 23 20 19  24*  --  29*  CREATININE 1.23*   < > 1.06* 1.05* 0.98 1.01*  --  0.99  CALCIUM  8.7*   < > 7.1* 7.4* 7.5* 8.0*  --  8.3*  MG 2.3  --  1.3*  --  2.1 2.0  --  2.2  PHOS  --   --  3.4  --  3.3 3.8  --  3.1   < > = values in this interval not displayed.   GFR: Estimated Creatinine Clearance: 42.1 mL/min (by C-G  formula based on SCr of 0.99 mg/dL). Recent Labs  Lab 08/09/23 0456 08/09/23 0942 08/09/23 1259 08/10/23 0304 08/10/23 1717 08/11/23 0153 08/11/23 0937 08/12/23 0407 08/13/23 0405  PROCALCITON 0.33  --   --   --   --   --  3.10  --   --   WBC 38.0*  --   --    < >  --  24.1* 28.4* 25.0* 17.8*  LATICACIDVEN 5.5* 4.2* 4.6*  --  1.0  --   --   --   --    < > = values in this interval not displayed.    Liver Function Tests: Recent Labs  Lab 08/09/23 0214 08/10/23 0304 08/11/23 0153 08/12/23 0407 08/13/23 0405  AST 97* 38 23 16 11*  ALT 51* 53* 37 28 23  ALKPHOS 129* 80 71 78 71  BILITOT 0.3 0.3 0.4 0.5 0.4  PROT 6.7 5.0* 5.6* 5.8* 5.8*  ALBUMIN 2.9* 2.4* 2.3* 2.3* 2.3*   No results for input(s): LIPASE, AMYLASE in the last 168 hours. No results for input(s): AMMONIA in the last 168 hours.  ABG    Component Value Date/Time   PHART 7.38 08/11/2023 1046   PCO2ART 42 08/11/2023 1046   PO2ART 190 (H) 08/11/2023 1046   HCO3 24.8 08/11/2023 1046   ACIDBASEDEF 0.4 08/11/2023 1046   O2SAT 99.4 08/11/2023 1046     Coagulation Profile: Recent Labs  Lab 08/09/23 0214  INR 1.4*    Cardiac Enzymes: No results for input(s): CKTOTAL, CKMB, CKMBINDEX, TROPONINI in the last 168 hours.  HbA1C: Hemoglobin A1C  Date/Time Value Ref Range Status  04/28/2016 12:00 AM 7.7%  Final   HB A1C (BAYER DCA - WAIVED)  Date/Time Value Ref Range Status  06/12/2023 09:28 AM 7.4 (H) 4.8 - 5.6 % Final    Comment:             Prediabetes: 5.7 - 6.4          Diabetes: >6.4          Glycemic control for adults with diabetes: <7.0   02/17/2023 01:36 PM 7.5 (H) 4.8 - 5.6 % Final    Comment:             Prediabetes: 5.7 - 6.4  Diabetes: >6.4          Glycemic control for adults with diabetes: <7.0    Hgb A1c MFr Bld  Date/Time Value Ref Range Status  04/16/2023 05:22 AM 6.7 (H) 4.8 - 5.6 % Final    Comment:    (NOTE)         Prediabetes: 5.7 - 6.4          Diabetes: >6.4         Glycemic control for adults with diabetes: <7.0     CBG: Recent Labs  Lab 08/12/23 1546 08/12/23 1930 08/12/23 2319 08/13/23 0325 08/13/23 0738  GLUCAP 142* 233* 268* 287* 255*    Review of Systems:   Unable to obtain  Past Medical History:  She,  has a past medical history of Arthritis, Asthma, Atrial fibrillation (HCC), Breast cancer (HCC) (1998), Bronchitis, CHF (congestive heart failure) (HCC), COPD (chronic obstructive pulmonary disease) (HCC), Diabetes mellitus without complication (HCC), Diverticulitis, Dyspnea, Endometriosis, GERD (gastroesophageal reflux disease), History of shingles (2000-2005), Hypercholesteremia, Hypertension, IBS (irritable bowel syndrome), Low back pain, Neuropathy, Orthopnea, Oxygen  dependent, Personal history of chemotherapy, Personal history of radiation therapy, Pneumonia, Scoliosis, Sleep apnea, Stroke (HCC) (2010), and 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   Surgical History:   Past Surgical History:  Procedure Laterality Date   ABDOMINAL HYSTERECTOMY  1987   BACK SURGERY     Tailbone removed following fracture   BREAST SURGERY Right    mastectomy   CARDIAC CATHETERIZATION     CATARACT EXTRACTION W/PHACO Right 05/19/2019   Procedure: CATARACT EXTRACTION PHACO AND INTRAOCULAR LENS PLACEMENT (IOC), RIGHT, DIABETIC;  Surgeon: Ferol Rogue, MD;  Location: [facility] ORS;  Service: Ophthalmology;  Laterality: Right;  Lot # [id number] H US : 00:43.8 CDE: 4.59   CATARACT EXTRACTION W/PHACO Left 06/16/2019   Procedure: CATARACT EXTRACTION PHACO AND INTRAOCULAR LENS PLACEMENT (IOC) LEFT VISION BLUE DIABETIC;  Surgeon: Ferol Rogue, MD;  Location: [facility] ORS;  Service: Ophthalmology;  Laterality: Left;  Lot #[id number] H US : 00:46.9 CDE: 6.53   COCCYX REMOVAL     COLONOSCOPY WITH PROPOFOL  N/A 01/01/2017   Procedure: COLONOSCOPY WITH PROPOFOL ;  Surgeon: Therisa Bi, MD;  Location: [facility] ENDOSCOPY;  Service: Endoscopy;  Laterality: N/A;    COLONOSCOPY WITH PROPOFOL  N/A 12/11/2021   Procedure: COLONOSCOPY WITH PROPOFOL ;  Surgeon: Unk Corinn Skiff, MD;  Location: [facility] ENDOSCOPY;  Service: Gastroenterology;  Laterality: N/A;   ELBOW ARTHROSCOPY WITH TENDON RECONSTRUCTION     ESOPHAGOGASTRODUODENOSCOPY (EGD) WITH PROPOFOL  N/A 01/01/2017   Procedure: ESOPHAGOGASTRODUODENOSCOPY (EGD) WITH PROPOFOL ;  Surgeon: Therisa Bi, MD;  Location: [facility] ENDOSCOPY;  Service: Endoscopy;  Laterality: N/A;   ESOPHAGOGASTRODUODENOSCOPY (EGD) WITH PROPOFOL  N/A 12/11/2021   Procedure: ESOPHAGOGASTRODUODENOSCOPY (EGD) WITH PROPOFOL ;  Surgeon: Unk Corinn Skiff, MD;  Location: [facility] ENDOSCOPY;  Service: Gastroenterology;  Laterality: N/A;   INTRATHECAL PUMP IMPLANT     LEFT HEART CATH AND CORONARY ANGIOGRAPHY N/A 04/28/2017   Procedure: LEFT HEART CATH AND CORONARY ANGIOGRAPHY;  Surgeon: Mady Bruckner, MD;  Location: [facility] INVASIVE CV LAB;  Service: Cardiovascular;  Laterality: N/A;   MASTECTOMY Right 06/1997   morphine  pump  2011   PLANTAR FASCIA RELEASE     TOTAL HIP ARTHROPLASTY Left    TRIGGER FINGER RELEASE       Social History:   reports that she quit smoking about 19 years ago. Her smoking use included cigarettes. She started smoking about 49 years ago. She has a 30 pack-year smoking history. She has never used smokeless tobacco. She reports that she does not  drink alcohol  and does not use drugs.   Family History:  Her family history includes Arthritis in her paternal grandfather; Cancer in her paternal grandmother and sister; Cancer (age of onset: 48) in her father; Cancer (age of onset: 16) in her mother; Hyperlipidemia in her sister and son; Hypertension in her sister; Osteoporosis in her maternal grandmother; Seizures in her son; Thyroid  disease in her mother. There is no history of Breast cancer.   Allergies Allergies  Allergen Reactions   Other Palpitations    IV steroids   Pain Patch [Menthol] Anaphylaxis   Avelox  [Moxifloxacin   Hcl In Nacl] Other (See Comments)    Upset stomach   Doxycycline  Diarrhea   Erythromycin  Nausea Only and Other (See Comments)    Can take a Z-Pak just fine Other reaction(s): Other (See Comments) Can take Z-Pak  Can take a Z-Pak just fine   Fentanyl  Nausea Only and Rash   Moxifloxacin  Hcl Other (See Comments)    Upset stomach Upset stomach   Oxycontin [Oxycodone] Hives   Ozempic [Semaglutide] Nausea Only     Home Medications  Prior to Admission medications   Medication Sig Start Date End Date Taking? Authorizing Provider  ACCU-CHEK AVIVA PLUS test strip TEST THREE TIMES DAILY 05/11/21   [name], Jolene T, NP  Accu-Chek Softclix Lancets lancets TEST BLOOD SUGAR THREE TIMES DAILY 02/26/23   [name], Jolene T, NP  albuterol  (PROVENTIL  HFA;VENTOLIN  HFA) 108 (90 BASE) MCG/ACT inhaler Inhale 2 puffs into the lungs every 6 (six) hours as needed for wheezing or shortness of breath.     [provider]  albuterol  (PROVENTIL ) (2.5 MG/3ML) 0.083% nebulizer solution Take 3 mLs (2.5 mg total) by nebulization every 4 (four) hours as needed for wheezing or shortness of breath. 07/18/22 08/04/23  Raenelle Coria, MD  Alcohol  Swabs (DROPSAFE ALCOHOL  PREP) 70 % PADS USE TWICE DAILY  WITH  SUGAR  CHECKS 11/14/21   [name], Jolene T, NP  apixaban  (ELIQUIS ) 5 MG TABS tablet Take 1 tablet (5 mg total) by mouth 2 (two) times daily. 06/24/22   [name], Jolene T, NP  atorvastatin  (LIPITOR) 40 MG tablet Take 1 tablet by mouth once daily 06/08/23   [name], Jolene T, NP  Blood Glucose Monitoring Suppl (TRUE METRIX METER) w/Device KIT Use to check blood sugar 4 times a day 05/14/21   [name], Jolene T, NP  busPIRone  (BUSPAR ) 5 MG tablet Take 1 tablet by mouth twice daily 06/02/23   [name], Jolene T, NP  cholecalciferol  (VITAMIN D3) 25 MCG (1000 UNIT) tablet Take 1,000 Units by mouth daily.    [provider]  Continuous Glucose Receiver (FREESTYLE LIBRE 2 READER) DEVI To check blood sugars 3-4 times  daily due to insulin  use. DX E11.40 12/11/22   [name], Melanie T, NP  Continuous Glucose Sensor (FREESTYLE LIBRE 2 SENSOR) MISC To check blood sugars 3-4 times daily due to insulin  use. DX E11.40 12/30/22   [name], Melanie T, NP  Cyanocobalamin  1000 MCG/ML KIT Inject 1,000 mcg as directed every 30 (thirty) days.    [provider]  cyclobenzaprine  (FLEXERIL ) 5 MG tablet Take 5 mg by mouth 2 (two) times daily as needed for muscle spasms. Takes very rarely    [provider]  Fluticasone -Umeclidin-Vilant (TRELEGY ELLIPTA ) 100-62.5-25 MCG/ACT AEPB INHALE 1 PUFF INTO THE LUNGS DAILY -42 DAY EXPIRATION AFTER OPENING FOIL TRAY 02/13/23   [name], Jolene T, NP  folic acid  (FOLVITE ) 1 MG tablet TAKE 1 TABLET EVERY DAY 10/14/21   Dasie Tinnie MATSU,  NP  furosemide  (LASIX ) 40 MG tablet Take 0.5 tablets (20 mg total) by mouth daily. 07/27/23   [name], Jolene T, NP  gabapentin  (NEURONTIN ) 800 MG tablet Take 1 tablet (800 mg total) by mouth 3 (three) times daily. 06/12/23   [name], Jolene T, NP  HYDROcodone -acetaminophen  (NORCO) 10-325 MG tablet Take 1 tablet by mouth every 4 (four) hours as needed.    [provider]  insulin  lispro (HUMALOG  KWIKPEN) 100 UNIT/ML KwikPen Inject 5 Units into the skin 3 (three) times daily before meals. Do not inject insulin  if you are not going to eat meal.  Only take before a meal. 12/11/22   [name], Jolene T, NP  losartan  (COZAAR ) 25 MG tablet Take 0.5 tablets (12.5 mg total) by mouth daily. 06/12/23   [name], Jolene T, NP  metFORMIN  (GLUCOPHAGE ) 1000 MG tablet Take 1 tablet (1,000 mg total) by mouth 2 (two) times daily with a meal. 06/12/23   [name], Jolene T, NP  metoprolol  succinate (TOPROL -XL) 25 MG 24 hr tablet Take 0.5 tablets (12.5 mg total) by mouth daily. 06/12/23   [name], Jolene T, NP  montelukast  (SINGULAIR ) 10 MG tablet Take 1 tablet (10 mg total) by mouth at bedtime. 06/12/23   [name], Jolene T, NP  naloxone  (NARCAN ) nasal spray 4 mg/0.1 mL Place 1  spray into the nose as needed for up to 365 doses (for opioid-induced respiratory depresssion). In case of emergency (overdose), spray once into each nostril. If no response within 3 minutes, repeat application and call 911. 08/04/23 08/03/24  Tanya Glisson, MD  Nebulizers (EASY AIR COMPRESSOR NEBULIZER) MISC Use to inhaler nebulizer treatments at needed per instructions on nebulizer prescription 07/21/22   [name], Jolene T, NP  ondansetron  (ZOFRAN ) 4 MG tablet Take 1 tablet (4 mg total) by mouth daily as needed for nausea or vomiting. 03/14/23 03/13/24  Cyrena Mylar, MD  OXYGEN  Inhale 2 L into the lungs at bedtime.    [provider]  PAIN MANAGEMENT INTRATHECAL, IT, PUMP 1 each by Intrathecal route. Intrathecal (IT) medication:  Morphine  5.0 mg/ml, Bupivacaine 20.0 mg/ml,  Clonidine  100.0 mcg/ml Patient does not remember current. Adjusted every 3 months at [facility]    [provider]  pantoprazole  (PROTONIX ) 40 MG tablet Take 1 tablet by mouth once daily 05/26/23   [name], Jolene T, NP  TRESIBA  FLEXTOUCH 100 UNIT/ML FlexTouch Pen INJECT 55 UNITS SUBCUTANEOUSLY AT BEDTIME 06/15/23   [name], Jolene T, NP  TRULICITY  4.5 MG/0.5ML SOPN INJECT 4.5MG  INTO THE SKIN ONCE WEEKLY AS DIRECTED 05/01/23   [name], Jolene T, NP  venlafaxine  XR (EFFEXOR -XR) 150 MG 24 hr capsule Take 1 capsule by mouth once daily 07/28/23   [name], Jolene T, NP     Critical care time:  37 minutes    [name], MD [facility] 08/13/2023 8:48 AM

## 2023-08-13 NOTE — Progress Notes (Signed)
 PHARMACY - ANTICOAGULATION CONSULT NOTE  Pharmacy Consult for Heparin   Indication: atrial fibrillation and pulmonary embolus  Patient Measurements: Height: 4' 10.27 (148 cm) Weight: 69.6 kg (153 lb 7 oz) IBW/kg (Calculated) : 41.52 Heparin  Dosing Weight: 56.7 kg   Labs: Recent Labs    08/11/23 0153 08/11/23 0936 08/11/23 0937 08/11/23 1949 08/12/23 0407 08/13/23 0405 08/13/23 2051  HGB 8.3*  --  9.8*  --  8.1* 7.4*  --   HCT 26.4*  --  30.4*  --  25.8* 23.3*  --   PLT 310  --  329  --  347 339  --   APTT  --  66*  --    < > 73* 50* 70*  HEPARINUNFRC  --  0.85*  --   --  0.46 0.35  --   CREATININE 0.98  --   --   --  1.01* 0.99  --    < > = values in this interval not displayed.    Estimated Creatinine Clearance: 42.1 mL/min (by C-G formula based on SCr of 0.99 mg/dL).   Medical History: Past Medical History:  Diagnosis Date   Arthritis    Asthma    Atrial fibrillation (HCC)    Breast cancer (HCC) 1998   right breast ca with mastectomy and chemotherapy and radiation   Bronchitis    CHF (congestive heart failure) (HCC)    with Morphine  withdrawal   COPD (chronic obstructive pulmonary disease) (HCC)    Diabetes mellitus without complication (HCC)    Diverticulitis    diverticulosis also   Dyspnea    Endometriosis    GERD (gastroesophageal reflux disease)    History of shingles 2000-2005   Hypercholesteremia    Hypertension    IBS (irritable bowel syndrome)    Low back pain    a. Implanted morphine /bupivicaine/clonidine  pump.   Neuropathy    Orthopnea    Oxygen  dependent    uses at night   Personal history of chemotherapy    Personal history of radiation therapy    Pneumonia    pneumonia 5-6 times, history of bronchitis also   Scoliosis    Sleep apnea    does not use cpap   Stroke (HCC) 2010   TIA, 10 years ago   Withdrawal from sedative drug (HCC)    withdrawal from morphine  when pump batteries died   Assessment: Pharmacy consulted to dose  heparin  in this 73 year old female admitted with respiratory failure, possible PE.   Pt was on Eliquis  5 mg PO BID PTA for Afib.   Unsure of last dose.  1/5 1741 aPTT 55 1/6 0304 aPTT 194,  HL >1.10 1/6 1556 aPTT 59  Subtherapeutic 1/7 0150 aPTT 93, therapeutic x 1 1/7 1949 aPTT 82, therapeutic x 2 1/8 0407 aPTT 73, therapeutic x 3 / HL 0.46 correlating x 1 / Hgb 9.8 > 8.1 1/9 2051 aPTT 70, therapeutic  Goal of Therapy:  aPTT : 66 - 102  Heparin  level 0.3-0.7 units/ml Monitor platelets by anticoagulation protocol: Yes   Plan:  aPTT today is subtherapeutic (still not quite correlating with heparin  levels) --continue heparin  infusion rate at 1300 units/hour --Check aPTT and HL with AM labs tomorrow --Monitor CBC and signs/symptoms of bleeding, Hgb trending down.  Thank you for involving pharmacy in this patient's care.   Will M. Lenon, PharmD Clinical Pharmacist 08/13/2023 9:45 PM

## 2023-08-13 NOTE — Progress Notes (Addendum)
 Notified NP that patient has increased BP upon administration of Cefepime . Paused medication and resumed several minutes later eliciting same result. Cefepime  stopped at this time. Adjusted Levo accordingly for proper BP support. ART line re-zeroed and cuff pressure completed several times, both very comparable pressures.

## 2023-08-13 NOTE — Progress Notes (Signed)
 PHARMACY CONSULT NOTE - FOLLOW UP  Pharmacy Consult for Electrolyte Monitoring and Replacement   Recent Labs: Potassium (mmol/L)  Date Value  08/13/2023 3.3 (L)  09/14/2013 3.5   Magnesium  (mg/dL)  Date Value  98/90/7974 2.2   Calcium  (mg/dL)  Date Value  98/90/7974 8.3 (L)   Calcium , Total (mg/dL)  Date Value  97/88/7984 9.0   Albumin (g/dL)  Date Value  98/90/7974 2.3 (L)  06/17/2023 4.2  09/14/2013 3.2 (L)   Phosphorus (mg/dL)  Date Value  98/90/7974 3.1   Sodium (mmol/L)  Date Value  08/13/2023 140  06/17/2023 138  09/14/2013 133 (L)    Assessment: 9 YOF w/ PMH of CHF , chronic pain syndrome with intrathecal pump, breast cancer hx, COPD, DM, recurrent exacerbation of COPD now coming in with shock due to sepsis with pneumonia and bilateral PE. Pharmacy is asked to follow and replace electrolytes while in CCU  Diuretics: 40 mg IV furosemide  x 1 01/09  Goal of Therapy:  Electrolytes WNL  Plan:  ---10 mEq iv KCl x 4 per NP ---recheck electrolytes in am  Jordan Chan ,PharmD Clinical Pharmacist 08/13/2023 7:15 AM

## 2023-08-13 NOTE — Progress Notes (Signed)
 PHARMACY - ANTICOAGULATION CONSULT NOTE  Pharmacy Consult for Heparin   Indication: atrial fibrillation and pulmonary embolus  Patient Measurements: Height: 4' 10.27 (148 cm) Weight: 69.6 kg (153 lb 7 oz) IBW/kg (Calculated) : 41.52 Heparin  Dosing Weight: 56.7 kg   Labs: Recent Labs    08/11/23 0153 08/11/23 0936 08/11/23 0937 08/11/23 1949 08/12/23 0407 08/13/23 0405  HGB 8.3*  --  9.8*  --  8.1* 7.4*  HCT 26.4*  --  30.4*  --  25.8* 23.3*  PLT 310  --  329  --  347 339  APTT  --  66*  --  82* 73* 50*  HEPARINUNFRC  --  0.85*  --   --  0.46 0.35  CREATININE 0.98  --   --   --  1.01* 0.99    Estimated Creatinine Clearance: 42.1 mL/min (by C-G formula based on SCr of 0.99 mg/dL).   Medical History: Past Medical History:  Diagnosis Date   Arthritis    Asthma    Atrial fibrillation (HCC)    Breast cancer (HCC) 1998   right breast ca with mastectomy and chemotherapy and radiation   Bronchitis    CHF (congestive heart failure) (HCC)    with Morphine  withdrawal   COPD (chronic obstructive pulmonary disease) (HCC)    Diabetes mellitus without complication (HCC)    Diverticulitis    diverticulosis also   Dyspnea    Endometriosis    GERD (gastroesophageal reflux disease)    History of shingles 2000-2005   Hypercholesteremia    Hypertension    IBS (irritable bowel syndrome)    Low back pain    a. Implanted morphine /bupivicaine/clonidine  pump.   Neuropathy    Orthopnea    Oxygen  dependent    uses at night   Personal history of chemotherapy    Personal history of radiation therapy    Pneumonia    pneumonia 5-6 times, history of bronchitis also   Scoliosis    Sleep apnea    does not use cpap   Stroke (HCC) 2010   TIA, 10 years ago   Withdrawal from sedative drug (HCC)    withdrawal from morphine  when pump batteries died   Assessment: Pharmacy consulted to dose heparin  in this 73 year old female admitted with respiratory failure, possible PE.   Pt was on  Eliquis  5 mg PO BID PTA for Afib.   Unsure of last dose.  1/5 1741 aPTT 55 1/6 0304 aPTT 194,  HL >1.10 1/6 1556 aPTT 59  Subtherapeutic 1/7 0150 aPTT 93, therapeutic x 1 1/7 1949 aPTT 82, therapeutic x 2 1/8 0407 aPTT 73, therapeutic x 3 / HL 0.46 correlating x 1 / Hgb 9.8 > 8.1  Goal of Therapy:  aPTT : 66 - 102  Heparin  level 0.3-0.7 units/ml Monitor platelets by anticoagulation protocol: Yes   Plan:  aPTT today is subtherapeutic (still not quite correlating with heparin  levels) --bolus 1700 units IV heparin  x 1 --increase heparin  infusion rate to 1300 units/hour --Recheck aPTT  in 8 hours after rate change --Monitor CBC and signs/symptoms of bleeding, Hgb trending down.  Thank you for involving pharmacy in this patient's care.   Adriana Bolster, PharmD, BCPS 08/13/2023 11:46 AM

## 2023-08-13 NOTE — Inpatient Diabetes Management (Signed)
 Inpatient Diabetes Program Recommendations  AACE/ADA: New Consensus Statement on Inpatient Glycemic Control  Target Ranges:  Prepandial:   less than 140 mg/dL      Peak postprandial:   less than 180 mg/dL (1-2 hours)      Critically ill patients:  140 - 180 mg/dL    Latest Reference Range & Units 08/12/23 07:59 08/12/23 11:47 08/12/23 15:46 08/12/23 19:30 08/12/23 23:19 08/13/23 03:25 08/13/23 07:38  Glucose-Capillary 70 - 99 mg/dL 781 (H) 819 (H) 857 (H) 233 (H) 268 (H) 287 (H) 255 (H)    Review of Glycemic Control  Diabetes history: DM2 Outpatient Diabetes medications: Tresiba  55 units at bedtime, Humalog  5 units TID with meals, Metformin  1000 mg BID, Trulicity  4.5 mg Qweek, FreeStyle Libre2 CGM Current orders for Inpatient glycemic control: Semglee  10 units daily (ordered this am), Novolog  0-20 units Q4H, Novolog  4 units Q4H; Solucortef 50 mg Q6H, Vital @ 55 ml/hr  Inpatient Diabetes Program Recommendations:    Insulin : Noted Semglee  10 units ordered today. Please consider increasing tube feeding coverage to Novolog  6 units Q4H.  Thanks, Earnie Gainer, RN, MSN, CDCES Diabetes Coordinator Inpatient Diabetes Program [phone] (Team Pager from 8am to 5pm)

## 2023-08-14 ENCOUNTER — Ambulatory Visit: Payer: Medicare HMO

## 2023-08-14 DIAGNOSIS — J9622 Acute and chronic respiratory failure with hypercapnia: Secondary | ICD-10-CM | POA: Diagnosis not present

## 2023-08-14 DIAGNOSIS — I2694 Multiple subsegmental pulmonary emboli without acute cor pulmonale: Secondary | ICD-10-CM | POA: Diagnosis not present

## 2023-08-14 DIAGNOSIS — J189 Pneumonia, unspecified organism: Secondary | ICD-10-CM | POA: Diagnosis not present

## 2023-08-14 DIAGNOSIS — J9621 Acute and chronic respiratory failure with hypoxia: Secondary | ICD-10-CM | POA: Diagnosis not present

## 2023-08-14 LAB — CULTURE, BLOOD (ROUTINE X 2)
Culture: NO GROWTH
Culture: NO GROWTH
Special Requests: ADEQUATE

## 2023-08-14 LAB — CBC
HCT: 24.2 % — ABNORMAL LOW (ref 36.0–46.0)
Hemoglobin: 7.5 g/dL — ABNORMAL LOW (ref 12.0–15.0)
MCH: 28.8 pg (ref 26.0–34.0)
MCHC: 31 g/dL (ref 30.0–36.0)
MCV: 93.1 fL (ref 80.0–100.0)
Platelets: 366 10*3/uL (ref 150–400)
RBC: 2.6 MIL/uL — ABNORMAL LOW (ref 3.87–5.11)
RDW: 17.2 % — ABNORMAL HIGH (ref 11.5–15.5)
WBC: 15.5 10*3/uL — ABNORMAL HIGH (ref 4.0–10.5)
nRBC: 0.1 % (ref 0.0–0.2)

## 2023-08-14 LAB — BASIC METABOLIC PANEL
Anion gap: 9 (ref 5–15)
BUN: 36 mg/dL — ABNORMAL HIGH (ref 8–23)
CO2: 25 mmol/L (ref 22–32)
Calcium: 8.4 mg/dL — ABNORMAL LOW (ref 8.9–10.3)
Chloride: 108 mmol/L (ref 98–111)
Creatinine, Ser: 0.92 mg/dL (ref 0.44–1.00)
GFR, Estimated: >60 mL/min (ref >60)
Glucose, Bld: 272 mg/dL — ABNORMAL HIGH (ref 70–99)
Potassium: 4.2 mmol/L (ref 3.5–5.1)
Sodium: 142 mmol/L (ref 135–145)

## 2023-08-14 LAB — GLUCOSE, CAPILLARY
Glucose-Capillary: 238 mg/dL — ABNORMAL HIGH (ref 70–99)
Glucose-Capillary: 236 mg/dL — ABNORMAL HIGH (ref 70–99)
Glucose-Capillary: 256 mg/dL — ABNORMAL HIGH (ref 70–99)
Glucose-Capillary: 171 mg/dL — ABNORMAL HIGH (ref 70–99)
Glucose-Capillary: 147 mg/dL — ABNORMAL HIGH (ref 70–99)

## 2023-08-14 LAB — APTT
aPTT: 46 s — ABNORMAL HIGH (ref 24–36)
aPTT: 74 s — ABNORMAL HIGH (ref 24–36)
aPTT: 69 s — ABNORMAL HIGH (ref 24–36)

## 2023-08-14 LAB — CULTURE, RESPIRATORY W GRAM STAIN

## 2023-08-14 LAB — PHOSPHORUS: Phosphorus: 3.3 mg/dL (ref 2.5–4.6)

## 2023-08-14 LAB — HEPARIN LEVEL (UNFRACTIONATED): Heparin Unfractionated: 0.49 [IU]/mL (ref 0.30–0.70)

## 2023-08-14 LAB — MAGNESIUM: Magnesium: 2.1 mg/dL (ref 1.7–2.4)

## 2023-08-14 LAB — TRIGLYCERIDES: Triglycerides: 100 mg/dL (ref <150)

## 2023-08-14 MED ORDER — HEPARIN BOLUS VIA INFUSION
1700.0000 [IU] | Freq: Once | INTRAVENOUS | Status: AC
  Administered 2023-08-14: 1700 [IU] via INTRAVENOUS
  Filled 2023-08-14: qty 1700

## 2023-08-14 MED ORDER — INSULIN GLARGINE-YFGN 100 UNIT/ML ~~LOC~~ SOLN
15.0000 [IU] | Freq: Every day | SUBCUTANEOUS | Status: DC
  Administered 2023-08-15 – 2023-08-16 (×2): 15 [IU] via SUBCUTANEOUS
  Filled 2023-08-14 (×3): qty 0.15

## 2023-08-14 MED ORDER — DOCUSATE SODIUM 50 MG/5ML PO LIQD
200.0000 mg | Freq: Two times a day (BID) | ORAL | Status: DC
  Administered 2023-08-14 (×2): 200 mg
  Filled 2023-08-14 (×3): qty 20

## 2023-08-14 MED ORDER — FUROSEMIDE 10 MG/ML IJ SOLN
40.0000 mg | Freq: Once | INTRAMUSCULAR | Status: AC
  Administered 2023-08-14: 40 mg via INTRAVENOUS
  Filled 2023-08-14: qty 4

## 2023-08-14 MED ORDER — IPRATROPIUM-ALBUTEROL 0.5-2.5 (3) MG/3ML IN SOLN
3.0000 mL | Freq: Three times a day (TID) | RESPIRATORY_TRACT | Status: DC
  Administered 2023-08-14 – 2023-08-21 (×20): 3 mL via RESPIRATORY_TRACT
  Filled 2023-08-14 (×21): qty 3

## 2023-08-14 MED ORDER — INSULIN GLARGINE-YFGN 100 UNIT/ML ~~LOC~~ SOLN
5.0000 [IU] | Freq: Once | SUBCUTANEOUS | Status: AC
  Administered 2023-08-14: 5 [IU] via SUBCUTANEOUS
  Filled 2023-08-14: qty 0.05

## 2023-08-14 MED ORDER — BISACODYL 10 MG RE SUPP
10.0000 mg | Freq: Every day | RECTAL | Status: DC | PRN
  Administered 2023-08-17: 10 mg via RECTAL
  Filled 2023-08-14: qty 1

## 2023-08-14 MED ORDER — DEXMEDETOMIDINE HCL IN NACL 400 MCG/100ML IV SOLN
0.0000 ug/kg/h | INTRAVENOUS | Status: DC
  Administered 2023-08-14: 0.4 ug/kg/h via INTRAVENOUS
  Administered 2023-08-14 (×2): 0.6 ug/kg/h via INTRAVENOUS
  Administered 2023-08-15: 0.5 ug/kg/h via INTRAVENOUS
  Administered 2023-08-15: 0.4 ug/kg/h via INTRAVENOUS
  Administered 2023-08-16: 1 ug/kg/h via INTRAVENOUS
  Filled 2023-08-14 (×5): qty 100

## 2023-08-14 MED ORDER — POLYETHYLENE GLYCOL 3350 17 G PO PACK
17.0000 g | PACK | Freq: Every day | ORAL | Status: DC
  Administered 2023-08-14: 17 g
  Filled 2023-08-14 (×2): qty 1

## 2023-08-14 NOTE — Progress Notes (Signed)
 PHARMACY - ANTICOAGULATION CONSULT NOTE  Pharmacy Consult for Heparin   Indication: atrial fibrillation and pulmonary embolus  Patient Measurements: Height: 4' 10.27 (148 cm) Weight: 69.6 kg (153 lb 7 oz) IBW/kg (Calculated) : 41.52 Heparin  Dosing Weight: 56.7 kg   Labs: Recent Labs    08/12/23 0407 08/13/23 0405 08/13/23 2051 08/14/23 0408 08/14/23 1354 08/14/23 2138  HGB 8.1* 7.4*  --  7.5*  --   --   HCT 25.8* 23.3*  --  24.2*  --   --   PLT 347 339  --  366  --   --   APTT 73* 50*   < > 46* 74* 69*  HEPARINUNFRC 0.46 0.35  --  0.49  --   --   CREATININE 1.01* 0.99  --  0.92  --   --    < > = values in this interval not displayed.    Estimated Creatinine Clearance: 45.3 mL/min (by C-G formula based on SCr of 0.92 mg/dL).   Medical History: Past Medical History:  Diagnosis Date   Arthritis    Asthma    Atrial fibrillation (HCC)    Breast cancer (HCC) 1998   right breast ca with mastectomy and chemotherapy and radiation   Bronchitis    CHF (congestive heart failure) (HCC)    with Morphine  withdrawal   COPD (chronic obstructive pulmonary disease) (HCC)    Diabetes mellitus without complication (HCC)    Diverticulitis    diverticulosis also   Dyspnea    Endometriosis    GERD (gastroesophageal reflux disease)    History of shingles 2000-2005   Hypercholesteremia    Hypertension    IBS (irritable bowel syndrome)    Low back pain    a. Implanted morphine /bupivicaine/clonidine  pump.   Neuropathy    Orthopnea    Oxygen  dependent    uses at night   Personal history of chemotherapy    Personal history of radiation therapy    Pneumonia    pneumonia 5-6 times, history of bronchitis also   Scoliosis    Sleep apnea    does not use cpap   Stroke (HCC) 2010   TIA, 10 years ago   Withdrawal from sedative drug (HCC)    withdrawal from morphine  when pump batteries died   Assessment: Pharmacy consulted to dose heparin  in this 73 year old female admitted with  respiratory failure, possible PE.   Pt was on Eliquis  5 mg PO BID PTA for Afib.   Unsure of last dose.  1/5 1741 aPTT 55 1/6 0304 aPTT 194,  HL >1.10 1/6 1556 aPTT 59  Subtherapeutic 1/7 0150 aPTT 93, therapeutic x 1 1/7 1949 aPTT 82, therapeutic x 2 1/8 0407 aPTT 73, therapeutic x 3 / HL 0.46 / Hgb 9.8 > 8.1 1/9 2051 aPTT 70, therapeutic 1/10 0408 aPTT 46, subtherapeutic / HL 0.49 not correlating 1/10 2138 aPTT 69, therapeutic x 2  Goal of Therapy:  aPTT : 66 - 102  Heparin  level 0.3-0.7 units/ml Monitor platelets by anticoagulation protocol: Yes   Plan:  aPTT therapeutic x 2 --continue heparin  infusion at 1500 units/hour --Recheck aPTT w/ AM labs daily while therapeutic --Recheck heparin  level once daily while on heparin  (we will continue to use aPTT to guide therapy until aPTT and heparin  level correlate) --Monitor CBC and signs/symptoms of bleeding, Hgb trending down.  Thank you for involving pharmacy in this patient's care.   Rankin CANDIE Dills, PharmD, [facility] 08/14/2023 10:38 PM

## 2023-08-14 NOTE — Plan of Care (Signed)
  Problem: Pain Management: Goal: General experience of comfort will improve Outcome: Progressing  Requiring lower doses of pain and anxiety medications this shift

## 2023-08-14 NOTE — Progress Notes (Signed)
 NAME:  Jordan Chan, MRN:  [id number], DOB:  1951/04/11, LOS: 5 ADMISSION DATE:  08/09/2023, CHIEF COMPLAINT:  Acute Hypoxic Respiratory Fialure   History of Present Illness:   73 y.o female with significant PMH of B12 deficiency, chronic pain syndrome s/p intrathecal pump, paroxysmal atrial fibrillation, stress-induced cardiomyopathy in the setting of pain pump malfunction and opioid withdrawal, PE following hospitalization for pneumonia, DM, COPD, GERD, OSA, and IBS who presented to the ED with chief complaints of acute respiratory distress found with sats in the 50 to 60% on RA per EMS.   ED Course: Initial vital signs showed HR of 142 beats/minute, BP mm Hg, the RR 23 breaths/minute, and the oxygen  saturation 100% on BVM and a temperature of 92.67F (33.3C).  Patient was intubated for airway protection. Pertinent Labs/Diagnostics Findings: Na+/ K+: 136/4.2 glucose: 418 BUN/Cr.:  13/1.23 AST/ALT: 97/51, CO2 14, anion gap 20 WBC: 25.2 K/L without bands or neutrophil predominance, Plts: 530 PCT:  Lactic acid: >9.0 COVID PCR: Negative,  troponin: 20  BNP:241.9 ABG: pO2 96; pCO2 51; pH 7.03;  HCO3 13.5, %O2 Sat 96.4.  CXR> see result ED Medications: Patient treated with Duonebs, Solumedrol 125 mg x 1, 30 cc/kg of fluids and started on broad-spectrum antibiotics Vanco cefepime  and Flagyl  for suspected sepsis    Pertinent  Medical History   B12 deficiency, chronic pain syndrome s/p intrathecal pump, paroxysmal atrial fibrillation, stress-induced cardiomyopathy in the setting of pain pump malfunction and opioid withdrawal, PE following hospitalization for pneumonia, DM, COPD, GERD, OSA, and IBS   Significant Hospital Events: Including procedures, antibiotic start and stop dates in addition to other pertinent events   1/5: Admitted to ICU with acute hypoxic hypercapnic respiratory failure secondary to suspected pneumonia and AECOPD. 1/6: failed SBT, hypertensive off sedation. Failed  switch to dexmedetomidine  1/7: failed extubation, had to be re-intubated at the bedside 1/8: switched to full code, switch fentanyl  to hydromorphone  1/9: remains on mechanical ventilation, better sedated 1/10: add precedex , re-attempt SBT  Interim History / Subjective:  Sedated, but arouses to verbal stimuli. Follows simple commands.  Objective   Blood pressure (!) 124/50, pulse 66, temperature 99.1 F (37.3 C), resp. rate (!) 24, height 4' 10.27 (1.48 m), weight 69.6 kg, SpO2 96%.    Vent Mode: PRVC FiO2 (%):  [28 %] 28 % Set Rate:  [24 bmp] 24 bmp Vt Set:  [400 mL] 400 mL PEEP:  [5 cmH20] 5 cmH20 Pressure Support:  [8 cmH20] 8 cmH20 Plateau Pressure:  [18 cmH20] 18 cmH20   Intake/Output Summary (Last 24 hours) at 08/14/2023 9192 Last data filed at 08/14/2023 0800 Gross per 24 hour  Intake 2987.7 ml  Output 2925 ml  Net 62.7 ml   Filed Weights   08/11/23 0430 08/12/23 0400 08/13/23 0330  Weight: 42.5 kg 39.2 kg 69.6 kg    Examination: Physical Exam Constitutional:      General: She is not in acute distress.    Appearance: She is ill-appearing.  HENT:     Head: Normocephalic.     Mouth/Throat:     Comments: ETT in place Cardiovascular:     Rate and Rhythm: Normal rate and regular rhythm.     Pulses: Normal pulses.     Heart sounds: Normal heart sounds.  Pulmonary:     Comments: Ventilated breath sounds bilaterally Neurological:     Mental Status: She is disoriented.     Comments: Sedated, ventilated. Does follow commands off sedation  Assessment & Plan:   Neurology #Toxic Metabolic Encephalopathy #Chronic Pain   Patient is maintained on an intra-thecal pump with morphine  and clonidine  and bupivacaine. Given pump chronicity, will hold off on making any changes unless absolutely necessary. Patient with significant pain at baseline and is also maintained on buspirone  and gabapentin  which are held for now. She does follow commands when awake (for daily wake  up assessment) but was extremely anxious. We have switched from fentanyl  to hydromorphone  with improved pain control.   -Maintain a RASS goal of -1 -Propofol  and hydromorphone  to maintain RASS goal and pain control per CPOT < 2 -add dexmedetomidine  to facilitate SBT   Cardiovascular #Circulatory Shock #Stress induced cardiomyopathy #Paroxysmal afib   Sedation related hypotensive briskly improves (with resultant hypertension during wake up assessment). Will begin fluid mobilization with diuretics, re-dose with IV furosemide  40 mg once today. Sinus tachycardia at baseline maintained on metoprolol , now held. BNP noted to be elevated at 241 suggesting a component of decompensated heart failure.   -on heparin  gtt for PE and afib -repeat furosemide  40 mg IV once -hold off on GDMT -repeat TTE shows LVEF of 25%   Pulmonary #Acute Hypoxic Respiratory Failure  Noted to have bilateral consolidative opacities, bilateral small layering pleural effusions, as well as bilaterally consolidated/collapsed lower lobes. Effusions are small and simple appearing on POCUS at the bedside, with underlying consolidated lower lobes. Respiratory failure likely secondary to a combination of pneumonia, pulmonary embolism, and decompensated heart failure. Failed extubation and had to be re-intubated a few days ago. Will re-attempt SBT today given minimal ventilator requirements.   -Full vent support, implement lung protective strategies -Plateau pressures less than 30 cm H20 -Wean FiO2 & PEEP as tolerated to maintain O2 sats >92% -Follow intermittent Chest X-ray & ABG as needed -Implement VAP Bundle -Prn Bronchodilators   Gastrointestinal   PPI for SUP, on tube feeds   Renal #AKI   Kidney function mildly elevated on presentation, improved on repeat. Have been providing spot diuresis with IV furosemide , will repeat today.  -furosemide  40 mg IV once  Endocrine   ICU glycemic protocol  -IV hydrocortisone   for severe CAP   Hem/Onc #VTE   She is maintained on apixaban  for history of PE and for afib and is noted to have acute PE on presentation. This is likely secondary to medication non-compliance. Continue heparin  for now, will consider hematology consult prior to discharge.   -Continue heparin  gtt for PE -monitor H/H and platelets   ID   #CAP   Continue with broad spectrum antibiotics. Negative viral panel. Negative MRSA screen, negative strep pneumonia and legionella antigens. Will continue IV azithromycin  for now given severity on pneumonia   -follow up tracheal aspirate -continue cefepime  and azithromycin  -continue hydrocortisone  for severe CAP   Best Practice (right click and Reselect all SmartList Selections daily)   Diet/type: tubefeeds DVT prophylaxis systemic heparin  Pressure ulcer(s): N/A GI prophylaxis: PPI Lines: Central line, Arterial Line, and yes and it is still needed Foley:  Yes, and it is still needed Code Status:  full code Last date of multidisciplinary goals of care discussion [08/14/2023]  Labs   CBC: Recent Labs  Lab 08/09/23 0214 08/09/23 0456 08/11/23 0153 08/11/23 0937 08/12/23 0407 08/13/23 0405 08/14/23 0408  WBC 25.2*   < > 24.1* 28.4* 25.0* 17.8* 15.5*  NEUTROABS 8.2*  --   --  23.9*  --   --   --   HGB 11.6*   < > 8.3* 9.8*  8.1* 7.4* 7.5*  HCT 41.6   < > 26.4* 30.4* 25.8* 23.3* 24.2*  MCV 101.5*   < > 90.1 87.6 90.2 90.3 93.1  PLT 530*   < > 310 329 347 339 366   < > = values in this interval not displayed.    Basic Metabolic Panel: Recent Labs  Lab 08/10/23 0304 08/10/23 1717 08/11/23 0153 08/12/23 0407 08/12/23 1544 08/13/23 0405 08/14/23 0408  NA 138 137 134* 135  --  140 142  K 4.4 4.3 4.0 3.2* 3.6 3.3* 4.2  CL 106 103 102 102  --  107 108  CO2 23 26 24 23   --  24 25  GLUCOSE 136* 150* 113* 242*  --  327* 272*  BUN 23 20 19  24*  --  29* 36*  CREATININE 1.06* 1.05* 0.98 1.01*  --  0.99 0.92  CALCIUM  7.1* 7.4* 7.5*  8.0*  --  8.3* 8.4*  MG 1.3*  --  2.1 2.0  --  2.2 2.1  PHOS 3.4  --  3.3 3.8  --  3.1 3.3   GFR: Estimated Creatinine Clearance: 45.3 mL/min (by C-G formula based on SCr of 0.92 mg/dL). Recent Labs  Lab 08/09/23 0456 08/09/23 0942 08/09/23 1259 08/10/23 0304 08/10/23 1717 08/11/23 0153 08/11/23 0937 08/12/23 0407 08/13/23 0405 08/14/23 0408  PROCALCITON 0.33  --   --   --   --   --  3.10  --   --   --   WBC 38.0*  --   --    < >  --    < > 28.4* 25.0* 17.8* 15.5*  LATICACIDVEN 5.5* 4.2* 4.6*  --  1.0  --   --   --   --   --    < > = values in this interval not displayed.    Liver Function Tests: Recent Labs  Lab 08/09/23 0214 08/10/23 0304 08/11/23 0153 08/12/23 0407 08/13/23 0405  AST 97* 38 23 16 11*  ALT 51* 53* 37 28 23  ALKPHOS 129* 80 71 78 71  BILITOT 0.3 0.3 0.4 0.5 0.4  PROT 6.7 5.0* 5.6* 5.8* 5.8*  ALBUMIN 2.9* 2.4* 2.3* 2.3* 2.3*   No results for input(s): LIPASE, AMYLASE in the last 168 hours. No results for input(s): AMMONIA in the last 168 hours.  ABG    Component Value Date/Time   PHART 7.38 08/11/2023 1046   PCO2ART 42 08/11/2023 1046   PO2ART 190 (H) 08/11/2023 1046   HCO3 24.8 08/11/2023 1046   ACIDBASEDEF 0.4 08/11/2023 1046   O2SAT 99.4 08/11/2023 1046     Coagulation Profile: Recent Labs  Lab 08/09/23 0214  INR 1.4*    Cardiac Enzymes: No results for input(s): CKTOTAL, CKMB, CKMBINDEX, TROPONINI in the last 168 hours.  HbA1C: Hemoglobin A1C  Date/Time Value Ref Range Status  04/28/2016 12:00 AM 7.7%  Final   HB A1C (BAYER DCA - WAIVED)  Date/Time Value Ref Range Status  06/12/2023 09:28 AM 7.4 (H) 4.8 - 5.6 % Final    Comment:             Prediabetes: 5.7 - 6.4          Diabetes: >6.4          Glycemic control for adults with diabetes: <7.0   02/17/2023 01:36 PM 7.5 (H) 4.8 - 5.6 % Final    Comment:             Prediabetes: 5.7 - 6.4  Diabetes: >6.4          Glycemic control for adults with  diabetes: <7.0    Hgb A1c MFr Bld  Date/Time Value Ref Range Status  04/16/2023 05:22 AM 6.7 (H) 4.8 - 5.6 % Final    Comment:    (NOTE)         Prediabetes: 5.7 - 6.4         Diabetes: >6.4         Glycemic control for adults with diabetes: <7.0     CBG: Recent Labs  Lab 08/13/23 1605 08/13/23 1925 08/13/23 2320 08/14/23 0319 08/14/23 0717  GLUCAP 208* 192* 210* 238* 236*    Review of Systems:   Unable to obtain  Past Medical History:  She,  has a past medical history of Arthritis, Asthma, Atrial fibrillation (HCC), Breast cancer (HCC) (1998), Bronchitis, CHF (congestive heart failure) (HCC), COPD (chronic obstructive pulmonary disease) (HCC), Diabetes mellitus without complication (HCC), Diverticulitis, Dyspnea, Endometriosis, GERD (gastroesophageal reflux disease), History of shingles (2000-2005), Hypercholesteremia, Hypertension, IBS (irritable bowel syndrome), Low back pain, Neuropathy, Orthopnea, Oxygen  dependent, Personal history of chemotherapy, Personal history of radiation therapy, Pneumonia, Scoliosis, Sleep apnea, Stroke (HCC) (2010), and 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   Surgical History:   Past Surgical History:  Procedure Laterality Date   ABDOMINAL HYSTERECTOMY  1987   BACK SURGERY     Tailbone removed following fracture   BREAST SURGERY Right    mastectomy   CARDIAC CATHETERIZATION     CATARACT EXTRACTION W/PHACO Right 05/19/2019   Procedure: CATARACT EXTRACTION PHACO AND INTRAOCULAR LENS PLACEMENT (IOC), RIGHT, DIABETIC;  Surgeon: Ferol Rogue, MD;  Location: [facility] ORS;  Service: Ophthalmology;  Laterality: Right;  Lot # [id number] H US : 00:43.8 CDE: 4.59   CATARACT EXTRACTION W/PHACO Left 06/16/2019   Procedure: CATARACT EXTRACTION PHACO AND INTRAOCULAR LENS PLACEMENT (IOC) LEFT VISION BLUE DIABETIC;  Surgeon: Ferol Rogue, MD;  Location: [facility] ORS;  Service: Ophthalmology;  Laterality: Left;  Lot #[id number] H US : 00:46.9 CDE: 6.53   COCCYX REMOVAL      COLONOSCOPY WITH PROPOFOL  N/A 01/01/2017   Procedure: COLONOSCOPY WITH PROPOFOL ;  Surgeon: Therisa Bi, MD;  Location: [facility] ENDOSCOPY;  Service: Endoscopy;  Laterality: N/A;   COLONOSCOPY WITH PROPOFOL  N/A 12/11/2021   Procedure: COLONOSCOPY WITH PROPOFOL ;  Surgeon: Unk Corinn Skiff, MD;  Location: [facility] ENDOSCOPY;  Service: Gastroenterology;  Laterality: N/A;   ELBOW ARTHROSCOPY WITH TENDON RECONSTRUCTION     ESOPHAGOGASTRODUODENOSCOPY (EGD) WITH PROPOFOL  N/A 01/01/2017   Procedure: ESOPHAGOGASTRODUODENOSCOPY (EGD) WITH PROPOFOL ;  Surgeon: Therisa Bi, MD;  Location: [facility] ENDOSCOPY;  Service: Endoscopy;  Laterality: N/A;   ESOPHAGOGASTRODUODENOSCOPY (EGD) WITH PROPOFOL  N/A 12/11/2021   Procedure: ESOPHAGOGASTRODUODENOSCOPY (EGD) WITH PROPOFOL ;  Surgeon: Unk Corinn Skiff, MD;  Location: [facility] ENDOSCOPY;  Service: Gastroenterology;  Laterality: N/A;   INTRATHECAL PUMP IMPLANT     LEFT HEART CATH AND CORONARY ANGIOGRAPHY N/A 04/28/2017   Procedure: LEFT HEART CATH AND CORONARY ANGIOGRAPHY;  Surgeon: Mady Bruckner, MD;  Location: [facility] INVASIVE CV LAB;  Service: Cardiovascular;  Laterality: N/A;   MASTECTOMY Right 06/1997   morphine  pump  2011   PLANTAR FASCIA RELEASE     TOTAL HIP ARTHROPLASTY Left    TRIGGER FINGER RELEASE       Social History:   reports that she quit smoking about 19 years ago. Her smoking use included cigarettes. She started smoking about 49 years ago. She has a 30 pack-year smoking history. She has never used smokeless tobacco. She reports that she does not  drink alcohol  and does not use drugs.   Family History:  Her family history includes Arthritis in her paternal grandfather; Cancer in her paternal grandmother and sister; Cancer (age of onset: 55) in her father; Cancer (age of onset: 3) in her mother; Hyperlipidemia in her sister and son; Hypertension in her sister; Osteoporosis in her maternal grandmother; Seizures in her son; Thyroid  disease in her mother.  There is no history of Breast cancer.   Allergies Allergies  Allergen Reactions   Other Palpitations    IV steroids   Pain Patch [Menthol] Anaphylaxis   Avelox  [Moxifloxacin  Hcl In Nacl] Other (See Comments)    Upset stomach   Doxycycline  Diarrhea   Erythromycin  Nausea Only and Other (See Comments)    Can take a Z-Pak just fine Other reaction(s): Other (See Comments) Can take Z-Pak  Can take a Z-Pak just fine   Fentanyl  Nausea Only and Rash   Moxifloxacin  Hcl Other (See Comments)    Upset stomach Upset stomach   Oxycontin [Oxycodone] Hives   Ozempic [Semaglutide] Nausea Only     Home Medications  Prior to Admission medications   Medication Sig Start Date End Date Taking? Authorizing Provider  ACCU-CHEK AVIVA PLUS test strip TEST THREE TIMES DAILY 05/11/21   [name], Jolene T, NP  Accu-Chek Softclix Lancets lancets TEST BLOOD SUGAR THREE TIMES DAILY 02/26/23   [name], Jolene T, NP  albuterol  (PROVENTIL  HFA;VENTOLIN  HFA) 108 (90 BASE) MCG/ACT inhaler Inhale 2 puffs into the lungs every 6 (six) hours as needed for wheezing or shortness of breath.     [provider]  albuterol  (PROVENTIL ) (2.5 MG/3ML) 0.083% nebulizer solution Take 3 mLs (2.5 mg total) by nebulization every 4 (four) hours as needed for wheezing or shortness of breath. 07/18/22 08/04/23  Raenelle Coria, MD  Alcohol  Swabs (DROPSAFE ALCOHOL  PREP) 70 % PADS USE TWICE DAILY  WITH  SUGAR  CHECKS 11/14/21   [name], Jolene T, NP  apixaban  (ELIQUIS ) 5 MG TABS tablet Take 1 tablet (5 mg total) by mouth 2 (two) times daily. 06/24/22   [name], Jolene T, NP  atorvastatin  (LIPITOR) 40 MG tablet Take 1 tablet by mouth once daily 06/08/23   [name], Jolene T, NP  Blood Glucose Monitoring Suppl (TRUE METRIX METER) w/Device KIT Use to check blood sugar 4 times a day 05/14/21   [name], Jolene T, NP  busPIRone  (BUSPAR ) 5 MG tablet Take 1 tablet by mouth twice daily 06/02/23   [name], Jolene T, NP  cholecalciferol  (VITAMIN D3)  25 MCG (1000 UNIT) tablet Take 1,000 Units by mouth daily.    [provider]  Continuous Glucose Receiver (FREESTYLE LIBRE 2 READER) DEVI To check blood sugars 3-4 times daily due to insulin  use. DX E11.40 12/11/22   [name], Melanie T, NP  Continuous Glucose Sensor (FREESTYLE LIBRE 2 SENSOR) MISC To check blood sugars 3-4 times daily due to insulin  use. DX E11.40 12/30/22   [name], Melanie T, NP  Cyanocobalamin  1000 MCG/ML KIT Inject 1,000 mcg as directed every 30 (thirty) days.    [provider]  cyclobenzaprine  (FLEXERIL ) 5 MG tablet Take 5 mg by mouth 2 (two) times daily as needed for muscle spasms. Takes very rarely    [provider]  Fluticasone -Umeclidin-Vilant (TRELEGY ELLIPTA ) 100-62.5-25 MCG/ACT AEPB INHALE 1 PUFF INTO THE LUNGS DAILY -42 DAY EXPIRATION AFTER OPENING FOIL TRAY 02/13/23   [name], Jolene T, NP  folic acid  (FOLVITE ) 1 MG tablet TAKE 1 TABLET EVERY DAY 10/14/21   Dasie Tinnie MATSU,  NP  furosemide  (LASIX ) 40 MG tablet Take 0.5 tablets (20 mg total) by mouth daily. 07/27/23   [name], Jolene T, NP  gabapentin  (NEURONTIN ) 800 MG tablet Take 1 tablet (800 mg total) by mouth 3 (three) times daily. 06/12/23   [name], Jolene T, NP  HYDROcodone -acetaminophen  (NORCO) 10-325 MG tablet Take 1 tablet by mouth every 4 (four) hours as needed.    [provider]  insulin  lispro (HUMALOG  KWIKPEN) 100 UNIT/ML KwikPen Inject 5 Units into the skin 3 (three) times daily before meals. Do not inject insulin  if you are not going to eat meal.  Only take before a meal. 12/11/22   [name], Jolene T, NP  losartan  (COZAAR ) 25 MG tablet Take 0.5 tablets (12.5 mg total) by mouth daily. 06/12/23   [name], Jolene T, NP  metFORMIN  (GLUCOPHAGE ) 1000 MG tablet Take 1 tablet (1,000 mg total) by mouth 2 (two) times daily with a meal. 06/12/23   [name], Jolene T, NP  metoprolol  succinate (TOPROL -XL) 25 MG 24 hr tablet Take 0.5 tablets (12.5 mg total) by mouth daily. 06/12/23   [name],  Jolene T, NP  montelukast  (SINGULAIR ) 10 MG tablet Take 1 tablet (10 mg total) by mouth at bedtime. 06/12/23   [name], Jolene T, NP  naloxone  (NARCAN ) nasal spray 4 mg/0.1 mL Place 1 spray into the nose as needed for up to 365 doses (for opioid-induced respiratory depresssion). In case of emergency (overdose), spray once into each nostril. If no response within 3 minutes, repeat application and call 911. 08/04/23 08/03/24  Tanya Glisson, MD  Nebulizers (EASY AIR COMPRESSOR NEBULIZER) MISC Use to inhaler nebulizer treatments at needed per instructions on nebulizer prescription 07/21/22   [name], Jolene T, NP  ondansetron  (ZOFRAN ) 4 MG tablet Take 1 tablet (4 mg total) by mouth daily as needed for nausea or vomiting. 03/14/23 03/13/24  Cyrena Mylar, MD  OXYGEN  Inhale 2 L into the lungs at bedtime.    [provider]  PAIN MANAGEMENT INTRATHECAL, IT, PUMP 1 each by Intrathecal route. Intrathecal (IT) medication:  Morphine  5.0 mg/ml, Bupivacaine 20.0 mg/ml,  Clonidine  100.0 mcg/ml Patient does not remember current. Adjusted every 3 months at [facility]    [provider]  pantoprazole  (PROTONIX ) 40 MG tablet Take 1 tablet by mouth once daily 05/26/23   [name], Jolene T, NP  TRESIBA  FLEXTOUCH 100 UNIT/ML FlexTouch Pen INJECT 55 UNITS SUBCUTANEOUSLY AT BEDTIME 06/15/23   [name], Jolene T, NP  TRULICITY  4.5 MG/0.5ML SOPN INJECT 4.5MG  INTO THE SKIN ONCE WEEKLY AS DIRECTED 05/01/23   [name], Jolene T, NP  venlafaxine  XR (EFFEXOR -XR) 150 MG 24 hr capsule Take 1 capsule by mouth once daily 07/28/23   [name], Jolene T, NP     Critical care time:  45 minutes   [name], MD [facility] 08/14/2023 10:55 AM

## 2023-08-14 NOTE — Progress Notes (Signed)
 PHARMACY - ANTICOAGULATION CONSULT NOTE  Pharmacy Consult for Heparin   Indication: atrial fibrillation and pulmonary embolus  Patient Measurements: Height: 4' 10.27 (148 cm) Weight: 69.6 kg (153 lb 7 oz) IBW/kg (Calculated) : 41.52 Heparin  Dosing Weight: 56.7 kg   Labs: Recent Labs    08/12/23 0407 08/13/23 0405 08/13/23 2051 08/14/23 0408  HGB 8.1* 7.4*  --  7.5*  HCT 25.8* 23.3*  --  24.2*  PLT 347 339  --  366  APTT 73* 50* 70* 46*  HEPARINUNFRC 0.46 0.35  --  0.49  CREATININE 1.01* 0.99  --  0.92    Estimated Creatinine Clearance: 45.3 mL/min (by C-G formula based on SCr of 0.92 mg/dL).   Medical History: Past Medical History:  Diagnosis Date   Arthritis    Asthma    Atrial fibrillation (HCC)    Breast cancer (HCC) 1998   right breast ca with mastectomy and chemotherapy and radiation   Bronchitis    CHF (congestive heart failure) (HCC)    with Morphine  withdrawal   COPD (chronic obstructive pulmonary disease) (HCC)    Diabetes mellitus without complication (HCC)    Diverticulitis    diverticulosis also   Dyspnea    Endometriosis    GERD (gastroesophageal reflux disease)    History of shingles 2000-2005   Hypercholesteremia    Hypertension    IBS (irritable bowel syndrome)    Low back pain    a. Implanted morphine /bupivicaine/clonidine  pump.   Neuropathy    Orthopnea    Oxygen  dependent    uses at night   Personal history of chemotherapy    Personal history of radiation therapy    Pneumonia    pneumonia 5-6 times, history of bronchitis also   Scoliosis    Sleep apnea    does not use cpap   Stroke (HCC) 2010   TIA, 10 years ago   Withdrawal from sedative drug (HCC)    withdrawal from morphine  when pump batteries died   Assessment: Pharmacy consulted to dose heparin  in this 73 year old female admitted with respiratory failure, possible PE.   Pt was on Eliquis  5 mg PO BID PTA for Afib.   Unsure of last dose.  1/5 1741 aPTT 55 1/6 0304 aPTT  194,  HL >1.10 1/6 1556 aPTT 59  Subtherapeutic 1/7 0150 aPTT 93, therapeutic x 1 1/7 1949 aPTT 82, therapeutic x 2 1/8 0407 aPTT 73, therapeutic x 3 / HL 0.46 / Hgb 9.8 > 8.1 1/9 2051 aPTT 70, therapeutic 1/10 0408 aPTT 46, subtherapeutic / HL 0.49 not correlating  Goal of Therapy:  aPTT : 66 - 102  Heparin  level 0.3-0.7 units/ml Monitor platelets by anticoagulation protocol: Yes   Plan:  aPTT therapeutic x 1 --continue heparin  infusion at 1500 units/hour --Recheck aPTT in 8 hrs to confirm --Recheck heparin  level once daily while on heparin  (we will continue to use aPTT to guide therapy until aPTT and heparin  level correlate) --Monitor CBC and signs/symptoms of bleeding, Hgb trending down.  Thank you for involving pharmacy in this patient's care.   Adriana Bolster, PharmD, BCPS 08/14/2023 7:06 AM

## 2023-08-14 NOTE — Progress Notes (Signed)
 PHARMACY CONSULT NOTE - FOLLOW UP  Pharmacy Consult for Electrolyte Monitoring and Replacement   Recent Labs: Potassium (mmol/L)  Date Value  08/14/2023 4.2  09/14/2013 3.5   Magnesium  (mg/dL)  Date Value  98/89/7974 2.1   Calcium  (mg/dL)  Date Value  98/89/7974 8.4 (L)   Calcium , Total (mg/dL)  Date Value  97/88/7984 9.0   Albumin (g/dL)  Date Value  98/90/7974 2.3 (L)  06/17/2023 4.2  09/14/2013 3.2 (L)   Phosphorus (mg/dL)  Date Value  98/89/7974 3.3   Sodium (mmol/L)  Date Value  08/14/2023 142  06/17/2023 138  09/14/2013 133 (L)    Assessment: 81 YOF w/ PMH of CHF , chronic pain syndrome with intrathecal pump, breast cancer hx, COPD, DM, recurrent exacerbation of COPD now coming in with shock due to sepsis with pneumonia and bilateral PE. Pharmacy is asked to follow and replace electrolytes while in CCU  Diuretics: 40 mg IV furosemide  x 1 01/10  Goal of Therapy:  Electrolytes WNL  Plan:  ---no electrolyte replacement warranted for today ---recheck electrolytes in am  [name] ,PharmD Clinical Pharmacist 08/14/2023 7:06 AM

## 2023-08-14 NOTE — Progress Notes (Signed)
 PHARMACY - ANTICOAGULATION CONSULT NOTE  Pharmacy Consult for Heparin   Indication: atrial fibrillation and pulmonary embolus  Patient Measurements: Height: 4' 10.27 (148 cm) Weight: 69.6 kg (153 lb 7 oz) IBW/kg (Calculated) : 41.52 Heparin  Dosing Weight: 56.7 kg   Labs: Recent Labs    08/12/23 0407 08/13/23 0405 08/13/23 2051 08/14/23 0408  HGB 8.1* 7.4*  --  7.5*  HCT 25.8* 23.3*  --  24.2*  PLT 347 339  --  366  APTT 73* 50* 70* 46*  HEPARINUNFRC 0.46 0.35  --  0.49  CREATININE 1.01* 0.99  --  0.92    Estimated Creatinine Clearance: 45.3 mL/min (by C-G formula based on SCr of 0.92 mg/dL).   Medical History: Past Medical History:  Diagnosis Date   Arthritis    Asthma    Atrial fibrillation (HCC)    Breast cancer (HCC) 1998   right breast ca with mastectomy and chemotherapy and radiation   Bronchitis    CHF (congestive heart failure) (HCC)    with Morphine  withdrawal   COPD (chronic obstructive pulmonary disease) (HCC)    Diabetes mellitus without complication (HCC)    Diverticulitis    diverticulosis also   Dyspnea    Endometriosis    GERD (gastroesophageal reflux disease)    History of shingles 2000-2005   Hypercholesteremia    Hypertension    IBS (irritable bowel syndrome)    Low back pain    a. Implanted morphine /bupivicaine/clonidine  pump.   Neuropathy    Orthopnea    Oxygen  dependent    uses at night   Personal history of chemotherapy    Personal history of radiation therapy    Pneumonia    pneumonia 5-6 times, history of bronchitis also   Scoliosis    Sleep apnea    does not use cpap   Stroke (HCC) 2010   TIA, 10 years ago   Withdrawal from sedative drug (HCC)    withdrawal from morphine  when pump batteries died   Assessment: Pharmacy consulted to dose heparin  in this 73 year old female admitted with respiratory failure, possible PE.   Pt was on Eliquis  5 mg PO BID PTA for Afib.   Unsure of last dose.  1/5 1741 aPTT 55 1/6 0304 aPTT  194,  HL >1.10 1/6 1556 aPTT 59  Subtherapeutic 1/7 0150 aPTT 93, therapeutic x 1 1/7 1949 aPTT 82, therapeutic x 2 1/8 0407 aPTT 73, therapeutic x 3 / HL 0.46 / Hgb 9.8 > 8.1 1/9 2051 aPTT 70, therapeutic 1/10 0408 aPTT 46, subtherapeutic / HL 0.49 not correlating  Goal of Therapy:  aPTT : 66 - 102  Heparin  level 0.3-0.7 units/ml Monitor platelets by anticoagulation protocol: Yes   Plan:   --Bolus 1700 units x 1 --Increase heparin  infusion rate to 15000 units/hour --Recheck aPTT in 8 hrs after rate change --Recheck HL daily while on heparin  --Monitor CBC and signs/symptoms of bleeding, Hgb trending down.  Thank you for involving pharmacy in this patient's care.   Rankin CANDIE Dills, PharmD, [facility] 08/14/2023 5:38 AM

## 2023-08-14 NOTE — Inpatient Diabetes Management (Signed)
 Inpatient Diabetes Program Recommendations  AACE/ADA: New Consensus Statement on Inpatient Glycemic Control   Target Ranges:  Prepandial:   less than 140 mg/dL      Peak postprandial:   less than 180 mg/dL (1-2 hours)      Critically ill patients:  140 - 180 mg/dL    Latest Reference Range & Units 08/13/23 07:38 08/13/23 11:20 08/13/23 16:05 08/13/23 19:25 08/13/23 23:20 08/14/23 03:19 08/14/23 07:17  Glucose-Capillary 70 - 99 mg/dL 744 (H) 765 (H) 791 (H) 192 (H) 210 (H) 238 (H) 236 (H)   Review of Glycemic Control  Diabetes history: DM2 Outpatient Diabetes medications: Tresiba  55 units at bedtime, Humalog  5 units TID with meals, Metformin  1000 mg BID, Trulicity  4.5 mg Qweek, FreeStyle Libre2 CGM Current orders for Inpatient glycemic control: Semglee  10 units daily, Novolog  0-20 units Q4H, Novolog  4 units Q4H; Solucortef 50 mg Q6H, Vital @ 55 ml/hr   Inpatient Diabetes Program Recommendations:     Insulin :  Please consider increasing Semglee  to 15 units daily and tube feeding coverage to Novolog  7 units Q4H.  Thanks, Earnie Gainer, RN, MSN, CDCES Diabetes Coordinator Inpatient Diabetes Program [phone] (Team Pager from 8am to 5pm)

## 2023-08-15 ENCOUNTER — Inpatient Hospital Stay: Payer: Medicare HMO

## 2023-08-15 DIAGNOSIS — I2694 Multiple subsegmental pulmonary emboli without acute cor pulmonale: Secondary | ICD-10-CM | POA: Diagnosis not present

## 2023-08-15 DIAGNOSIS — I5020 Unspecified systolic (congestive) heart failure: Secondary | ICD-10-CM | POA: Diagnosis not present

## 2023-08-15 DIAGNOSIS — J9621 Acute and chronic respiratory failure with hypoxia: Secondary | ICD-10-CM | POA: Diagnosis not present

## 2023-08-15 DIAGNOSIS — J189 Pneumonia, unspecified organism: Secondary | ICD-10-CM | POA: Diagnosis not present

## 2023-08-15 LAB — CBC
HCT: 24.2 % — ABNORMAL LOW (ref 36.0–46.0)
Hemoglobin: 7.5 g/dL — ABNORMAL LOW (ref 12.0–15.0)
MCH: 28.5 pg (ref 26.0–34.0)
MCHC: 31 g/dL (ref 30.0–36.0)
MCV: 92 fL (ref 80.0–100.0)
Platelets: 313 10*3/uL (ref 150–400)
RBC: 2.63 MIL/uL — ABNORMAL LOW (ref 3.87–5.11)
RDW: 17.2 % — ABNORMAL HIGH (ref 11.5–15.5)
WBC: 11.8 10*3/uL — ABNORMAL HIGH (ref 4.0–10.5)
nRBC: 0 % (ref 0.0–0.2)

## 2023-08-15 LAB — MAGNESIUM: Magnesium: 1.9 mg/dL (ref 1.7–2.4)

## 2023-08-15 LAB — GLUCOSE, CAPILLARY
Glucose-Capillary: 115 mg/dL — ABNORMAL HIGH (ref 70–99)
Glucose-Capillary: 173 mg/dL — ABNORMAL HIGH (ref 70–99)
Glucose-Capillary: 185 mg/dL — ABNORMAL HIGH (ref 70–99)
Glucose-Capillary: 191 mg/dL — ABNORMAL HIGH (ref 70–99)
Glucose-Capillary: 198 mg/dL — ABNORMAL HIGH (ref 70–99)
Glucose-Capillary: 229 mg/dL — ABNORMAL HIGH (ref 70–99)
Glucose-Capillary: 249 mg/dL — ABNORMAL HIGH (ref 70–99)

## 2023-08-15 LAB — RENAL FUNCTION PANEL
Albumin: 2.1 g/dL — ABNORMAL LOW (ref 3.5–5.0)
Anion gap: 8 (ref 5–15)
BUN: 41 mg/dL — ABNORMAL HIGH (ref 8–23)
CO2: 28 mmol/L (ref 22–32)
Calcium: 8.3 mg/dL — ABNORMAL LOW (ref 8.9–10.3)
Chloride: 105 mmol/L (ref 98–111)
Creatinine, Ser: 0.85 mg/dL (ref 0.44–1.00)
GFR, Estimated: >60 mL/min (ref >60)
Glucose, Bld: 255 mg/dL — ABNORMAL HIGH (ref 70–99)
Phosphorus: 3.5 mg/dL (ref 2.5–4.6)
Potassium: 3.5 mmol/L (ref 3.5–5.1)
Sodium: 141 mmol/L (ref 135–145)

## 2023-08-15 LAB — APTT
aPTT: 103 s — ABNORMAL HIGH (ref 24–36)
aPTT: 64 s — ABNORMAL HIGH (ref 24–36)

## 2023-08-15 LAB — HEPARIN LEVEL (UNFRACTIONATED): Heparin Unfractionated: 0.99 [IU]/mL — ABNORMAL HIGH (ref 0.30–0.70)

## 2023-08-15 MED ORDER — PIPERACILLIN-TAZOBACTAM 3.375 G IVPB
3.3750 g | Freq: Three times a day (TID) | INTRAVENOUS | Status: AC
  Administered 2023-08-15 – 2023-08-17 (×8): 3.375 g via INTRAVENOUS
  Filled 2023-08-15 (×8): qty 50

## 2023-08-15 MED ORDER — POTASSIUM CHLORIDE 20 MEQ PO PACK: 20.0000 meq | PACK | Freq: Once | ORAL | Status: DC

## 2023-08-15 MED ORDER — HYDROCORTISONE SOD SUC (PF) 100 MG IJ SOLR
50.0000 mg | Freq: Two times a day (BID) | INTRAMUSCULAR | Status: DC
  Administered 2023-08-15 – 2023-08-16 (×2): 50 mg via INTRAVENOUS
  Filled 2023-08-15 (×2): qty 2

## 2023-08-15 MED ORDER — HEPARIN BOLUS VIA INFUSION
850.0000 [IU] | Freq: Once | INTRAVENOUS | Status: AC
  Administered 2023-08-15: 850 [IU] via INTRAVENOUS
  Filled 2023-08-15: qty 850

## 2023-08-15 NOTE — Progress Notes (Signed)
 PHARMACY - ANTICOAGULATION CONSULT NOTE  Pharmacy Consult for Heparin   Indication: atrial fibrillation and pulmonary embolus  Patient Measurements: Height: 4' 10.27 (148 cm) Weight: 70 kg (154 lb 5.2 oz) IBW/kg (Calculated) : 41.52 Heparin  Dosing Weight: 56.7 kg   Labs: Recent Labs    08/13/23 0405 08/13/23 2051 08/14/23 0408 08/14/23 1354 08/14/23 2138 08/15/23 0433 08/15/23 1351  HGB 7.4*  --  7.5*  --   --  7.5*  --   HCT 23.3*  --  24.2*  --   --  24.2*  --   PLT 339  --  366  --   --  313  --   APTT 50*   < > 46*   < > 69* 103* 64*  HEPARINUNFRC 0.35  --  0.49  --   --  0.99*  --   CREATININE 0.99  --  0.92  --   --  0.85  --    < > = values in this interval not displayed.    Estimated Creatinine Clearance: 49.2 mL/min (by C-G formula based on SCr of 0.85 mg/dL).  Medical History: Past Medical History:  Diagnosis Date   Arthritis    Asthma    Atrial fibrillation (HCC)    Breast cancer (HCC) 1998   right breast ca with mastectomy and chemotherapy and radiation   Bronchitis    CHF (congestive heart failure) (HCC)    with Morphine  withdrawal   COPD (chronic obstructive pulmonary disease) (HCC)    Diabetes mellitus without complication (HCC)    Diverticulitis    diverticulosis also   Dyspnea    Endometriosis    GERD (gastroesophageal reflux disease)    History of shingles 2000-2005   Hypercholesteremia    Hypertension    IBS (irritable bowel syndrome)    Low back pain    a. Implanted morphine /bupivicaine/clonidine  pump.   Neuropathy    Orthopnea    Oxygen  dependent    uses at night   Personal history of chemotherapy    Personal history of radiation therapy    Pneumonia    pneumonia 5-6 times, history of bronchitis also   Scoliosis    Sleep apnea    does not use cpap   Stroke (HCC) 2010   TIA, 10 years ago   Withdrawal from sedative drug (HCC)    withdrawal from morphine  when pump batteries died   Assessment: Pharmacy consulted to dose  heparin  in this 73 year old female admitted with respiratory failure, possible PE.   Pt was on Eliquis  5 mg PO BID PTA for Afib.   Unsure of last dose.  1/5 1741 aPTT 55 1/6 0304 aPTT 194,  HL >1.10 1/6 1556 aPTT 59  Subtherapeutic 1/7 0150 aPTT 93, therapeutic x 1 1/7 1949 aPTT 82, therapeutic x 2 1/8 0407 aPTT 73, therapeutic x 3 / HL 0.46 / Hgb 9.8 > 8.1 1/9 2051 aPTT 70, therapeutic 1/10 0408 aPTT 46, subtherapeutic / HL 0.49 not correlating 1/10 2138 aPTT 69, therapeutic x 2 1/11 0433 aPTT 103, slightly supratherapeutic / HL 0.99 1/11 1351 aPTT 64, SUBtherapeutic   Goal of Therapy:  aPTT : 66 - 102  Heparin  level 0.3-0.7 units/ml Monitor platelets by anticoagulation protocol: Yes   Plan:   Give heparin  bolus 850 units x 1 Increase heparin  gtt 1,500 units/hr to from 1,400 units/hr Recheck aPTT in 8 hrs after rate change Recheck heparin  level once daily while on heparin  (we will continue to use aPTT to guide  therapy until aPTT and heparin  level correlate) Monitor CBC and signs/symptoms of bleeding, Hgb trending down.  Thank you for involving pharmacy in this patient's care.   Alfonso MARLA Buys, PharmD Pharmacy Resident  08/15/2023 2:51 PM

## 2023-08-15 NOTE — Progress Notes (Addendum)
 NAME:  Jordan Chan, MRN:  [id number], DOB:  [birth year], LOS: 6 ADMISSION DATE:  08/09/2023, CHIEF COMPLAINT:  Acute Hypoxic Respiratory Fialure   History of Present Illness:   73 y.o female with significant PMH of B12 deficiency, chronic pain syndrome s/p intrathecal pump, paroxysmal atrial fibrillation, stress-induced cardiomyopathy in the setting of pain pump malfunction and opioid withdrawal, PE following hospitalization for pneumonia, DM, COPD, GERD, OSA, and IBS who presented to the ED with chief complaints of acute respiratory distress found with sats in the 50 to 60% on RA per EMS.   ED Course: Initial vital signs showed HR of 142 beats/minute, BP mm Hg, the RR 23 breaths/minute, and the oxygen  saturation 100% on BVM and a temperature of 92.77F (33.3C).  Patient was intubated for airway protection. Pertinent Labs/Diagnostics Findings: Na+/ K+: 136/4.2 glucose: 418 BUN/Cr.:  13/1.23 AST/ALT: 97/51, CO2 14, anion gap 20 WBC: 25.2 K/L without bands or neutrophil predominance, Plts: 530 PCT:  Lactic acid: >9.0 COVID PCR: Negative,  troponin: 20  BNP:241.9 ABG: pO2 96; pCO2 51; pH 7.03;  HCO3 13.5, %O2 Sat 96.4.  CXR> see result ED Medications: Patient treated with Duonebs, Solumedrol 125 mg x 1, 30 cc/kg of fluids and started on broad-spectrum antibiotics Vanco cefepime  and Flagyl  for suspected sepsis    Pertinent  Medical History   B12 deficiency, chronic pain syndrome s/p intrathecal pump, paroxysmal atrial fibrillation, stress-induced cardiomyopathy in the setting of pain pump malfunction and opioid withdrawal, PE following hospitalization for pneumonia, DM, COPD, GERD, OSA, and IBS   Significant Hospital Events: Including procedures, antibiotic start and stop dates in addition to other pertinent events   1/5: Admitted to ICU with acute hypoxic hypercapnic respiratory failure secondary to suspected pneumonia and AECOPD. 1/6: failed SBT, hypertensive off sedation. Failed  switch to dexmedetomidine  1/7: failed extubation, had to be re-intubated at the bedside 1/8: switched to full code, switch fentanyl  to hydromorphone  1/9: remains on mechanical ventilation, better sedated 1/10: add precedex , re-attempt SBT 1/11: SBT on precedex  successful, extubated to HFNC  Interim History / Subjective:  Awake, follows commands. Unable to participate in history taking  Objective   Blood pressure (!) 114/49, pulse (!) 59, temperature 98.6 F (37 C), resp. rate (!) 24, height 4' 10.27 (1.48 m), weight 70 kg, SpO2 98%.    Vent Mode: PRVC FiO2 (%):  [28 %] 28 % Set Rate:  [24 bmp] 24 bmp Vt Set:  [400 mL] 400 mL PEEP:  [5 cmH20] 5 cmH20 Pressure Support:  [5 cmH20] 5 cmH20   Intake/Output Summary (Last 24 hours) at 08/15/2023 0815 Last data filed at 08/15/2023 0600 Gross per 24 hour  Intake 1711.53 ml  Output 2890 ml  Net -1178.47 ml   Filed Weights   08/12/23 0400 08/13/23 0330 08/15/23 0500  Weight: 39.2 kg 69.6 kg 70 kg    Examination: Physical Exam Constitutional:      General: She is not in acute distress.    Appearance: She is ill-appearing.  HENT:     Head: Normocephalic.     Mouth/Throat:     Comments: ETT in place Cardiovascular:     Rate and Rhythm: Normal rate and regular rhythm.     Pulses: Normal pulses.     Heart sounds: Normal heart sounds.  Pulmonary:     Comments: Ventilated breath sounds bilaterally Neurological:     Comments: Follows commands off sedation, awake and alert      Assessment & Plan:   Neurology #Toxic  Metabolic Encephalopathy #Chronic Pain   Patient is maintained on an intra-thecal pump with morphine  and clonidine  and bupivacaine. Given pump chronicity, will hold off on making any changes unless absolutely necessary. Patient with significant pain at baseline and is also maintained on buspirone  and gabapentin  which are held for now. Follows commands off sedation, transitioned to dexmedetomidine  overnight and  hydromorphone  held for SBT.  -add dexmedetomidine  to facilitate SBT   Cardiovascular #Circulatory Shock #Stress induced cardiomyopathy #Paroxysmal afib #HFrEF   Sedation related hypotensive briskly improves (with resultant hypertension during wake up assessment). Have been diuresing daily with IV furosemide  40 mg but holding today given rise in BUN. Sinus tachycardia at baseline maintained on metoprolol , now held. BNP noted to be elevated at 241 suggesting a component of decompensated heart failure. Will hold off on GDMT at the moment and re-instate as her hemodynamics improve.   -on heparin  gtt for PE and afib -hold diuresis today -hold off on GDMT -repeat TTE shows LVEF of 25%   Pulmonary #Acute Hypoxic Respiratory Failure #Pulmonary Embolism #Bilateral Pleural EFfusions  Noted to have bilateral consolidative opacities, bilateral small layering pleural effusions, as well as bilaterally consolidated/collapsed lower lobes. Effusions are small and simple appearing on POCUS at the bedside, with underlying consolidated lower lobes. Respiratory failure likely secondary to a combination of pneumonia, pulmonary embolism, and decompensated heart failure. Failed extubation and had to be re-intubated a few days ago.   -passing SBT today > extubate to HFNC -maintain HFNC for 24 hours   Gastrointestinal   PPI for SUP, tube feeds held post intubation   Renal #AKI   Kidney function mildly elevated on presentation, improved on repeat. Have been providing spot diuresis with IV furosemide , will hold today. Monitoring kidney function and avoiding nephrotoxins  Endocrine   ICU glycemic protocol  -IV hydrocortisone  for severe CAP > down to 50 bid   Hem/Onc #VTE   She is maintained on apixaban  for history of PE and for afib and is noted to have acute PE on presentation. This is likely secondary to medication non-compliance. Continue heparin  for now, will consider hematology consult prior to  discharge.   -Continue heparin  gtt for PE -monitor H/H and platelets   ID   #CAP   Continue with broad spectrum antibiotics. Negative viral panel. Negative MRSA screen, negative strep pneumonia and legionella antigens. Will continue IV azithromycin  for given severity on pneumonia   -follow up tracheal aspirate -on Zosyn  and azithromycin  -taper hydrocortisone    Best Practice (right click and Reselect all SmartList Selections daily)   Diet/type: tubefeeds DVT prophylaxis systemic heparin  Pressure ulcer(s): N/A GI prophylaxis: PPI Lines: Central line, Arterial Line, and yes and it is still needed Foley:  Yes, and it is still needed Code Status:  full code Last date of multidisciplinary goals of care discussion [08/15/2023]  Labs   CBC: Recent Labs  Lab 08/09/23 0214 08/09/23 0456 08/11/23 0937 08/12/23 0407 08/13/23 0405 08/14/23 0408 08/15/23 0433  WBC 25.2*   < > 28.4* 25.0* 17.8* 15.5* 11.8*  NEUTROABS 8.2*  --  23.9*  --   --   --   --   HGB 11.6*   < > 9.8* 8.1* 7.4* 7.5* 7.5*  HCT 41.6   < > 30.4* 25.8* 23.3* 24.2* 24.2*  MCV 101.5*   < > 87.6 90.2 90.3 93.1 92.0  PLT 530*   < > 329 347 339 366 313   < > = values in this interval not displayed.  Basic Metabolic Panel: Recent Labs  Lab 08/11/23 0153 08/12/23 0407 08/12/23 1544 08/13/23 0405 08/14/23 0408 08/15/23 0433  NA 134* 135  --  140 142 141  K 4.0 3.2* 3.6 3.3* 4.2 3.5  CL 102 102  --  107 108 105  CO2 24 23  --  24 25 28   GLUCOSE 113* 242*  --  327* 272* 255*  BUN 19 24*  --  29* 36* 41*  CREATININE 0.98 1.01*  --  0.99 0.92 0.85  CALCIUM  7.5* 8.0*  --  8.3* 8.4* 8.3*  MG 2.1 2.0  --  2.2 2.1 1.9  PHOS 3.3 3.8  --  3.1 3.3 3.5   GFR: Estimated Creatinine Clearance: 49.2 mL/min (by C-G formula based on SCr of 0.85 mg/dL). Recent Labs  Lab 08/09/23 0456 08/09/23 0942 08/09/23 1259 08/10/23 0304 08/10/23 1717 08/11/23 0153 08/11/23 9062 08/12/23 0407 08/13/23 0405 08/14/23 0408  08/15/23 0433  PROCALCITON 0.33  --   --   --   --   --  3.10  --   --   --   --   WBC 38.0*  --   --    < >  --    < > 28.4* 25.0* 17.8* 15.5* 11.8*  LATICACIDVEN 5.5* 4.2* 4.6*  --  1.0  --   --   --   --   --   --    < > = values in this interval not displayed.    Liver Function Tests: Recent Labs  Lab 08/09/23 0214 08/10/23 0304 08/11/23 0153 08/12/23 0407 08/13/23 0405 08/15/23 0433  AST 97* 38 23 16 11*  --   ALT 51* 53* 37 28 23  --   ALKPHOS 129* 80 71 78 71  --   BILITOT 0.3 0.3 0.4 0.5 0.4  --   PROT 6.7 5.0* 5.6* 5.8* 5.8*  --   ALBUMIN 2.9* 2.4* 2.3* 2.3* 2.3* 2.1*   No results for input(s): LIPASE, AMYLASE in the last 168 hours. No results for input(s): AMMONIA in the last 168 hours.  ABG    Component Value Date/Time   PHART 7.38 08/11/2023 1046   PCO2ART 42 08/11/2023 1046   PO2ART 190 (H) 08/11/2023 1046   HCO3 24.8 08/11/2023 1046   ACIDBASEDEF 0.4 08/11/2023 1046   O2SAT 99.4 08/11/2023 1046     Coagulation Profile: Recent Labs  Lab 08/09/23 0214  INR 1.4*    Cardiac Enzymes: No results for input(s): CKTOTAL, CKMB, CKMBINDEX, TROPONINI in the last 168 hours.  HbA1C: Hemoglobin A1C  Date/Time Value Ref Range Status  04/28/2016 12:00 AM 7.7%  Final   HB A1C (BAYER DCA - WAIVED)  Date/Time Value Ref Range Status  06/12/2023 09:28 AM 7.4 (H) 4.8 - 5.6 % Final    Comment:             Prediabetes: 5.7 - 6.4          Diabetes: >6.4          Glycemic control for adults with diabetes: <7.0   02/17/2023 01:36 PM 7.5 (H) 4.8 - 5.6 % Final    Comment:             Prediabetes: 5.7 - 6.4          Diabetes: >6.4          Glycemic control for adults with diabetes: <7.0    Hgb A1c MFr Bld  Date/Time Value Ref Range Status  04/16/2023 05:22  AM 6.7 (H) 4.8 - 5.6 % Final    Comment:    (NOTE)         Prediabetes: 5.7 - 6.4         Diabetes: >6.4         Glycemic control for adults with diabetes: <7.0     CBG: Recent Labs  Lab  08/14/23 1533 08/14/23 1957 08/15/23 0000 08/15/23 0420 08/15/23 0758  GLUCAP 171* 147* 185* 249* 229*    Review of Systems:   Unable to obtain  Past Medical History:  She,  has a past medical history of Arthritis, Asthma, Atrial fibrillation (HCC), Breast cancer (HCC) (1998), Bronchitis, CHF (congestive heart failure) (HCC), COPD (chronic obstructive pulmonary disease) (HCC), Diabetes mellitus without complication (HCC), Diverticulitis, Dyspnea, Endometriosis, GERD (gastroesophageal reflux disease), History of shingles (2000-2005), Hypercholesteremia, Hypertension, IBS (irritable bowel syndrome), Low back pain, Neuropathy, Orthopnea, Oxygen  dependent, Personal history of chemotherapy, Personal history of radiation therapy, Pneumonia, Scoliosis, Sleep apnea, Stroke (HCC) (2010), and 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   Surgical History:   Past Surgical History:  Procedure Laterality Date   ABDOMINAL HYSTERECTOMY  1987   BACK SURGERY     Tailbone removed following fracture   BREAST SURGERY Right    mastectomy   CARDIAC CATHETERIZATION     CATARACT EXTRACTION W/PHACO Right 05/19/2019   Procedure: CATARACT EXTRACTION PHACO AND INTRAOCULAR LENS PLACEMENT (IOC), RIGHT, DIABETIC;  Surgeon: Ferol Rogue, MD;  Location: [facility] ORS;  Service: Ophthalmology;  Laterality: Right;  Lot # [id number] H US : 00:43.8 CDE: 4.59   CATARACT EXTRACTION W/PHACO Left 06/16/2019   Procedure: CATARACT EXTRACTION PHACO AND INTRAOCULAR LENS PLACEMENT (IOC) LEFT VISION BLUE DIABETIC;  Surgeon: Ferol Rogue, MD;  Location: [facility] ORS;  Service: Ophthalmology;  Laterality: Left;  Lot #[id number] H US : 00:46.9 CDE: 6.53   COCCYX REMOVAL     COLONOSCOPY WITH PROPOFOL  N/A 01/01/2017   Procedure: COLONOSCOPY WITH PROPOFOL ;  Surgeon: Therisa Bi, MD;  Location: [facility] ENDOSCOPY;  Service: Endoscopy;  Laterality: N/A;   COLONOSCOPY WITH PROPOFOL  N/A 12/11/2021   Procedure: COLONOSCOPY WITH PROPOFOL ;  Surgeon: Unk Corinn Skiff, MD;  Location: [facility] ENDOSCOPY;  Service: Gastroenterology;  Laterality: N/A;   ELBOW ARTHROSCOPY WITH TENDON RECONSTRUCTION     ESOPHAGOGASTRODUODENOSCOPY (EGD) WITH PROPOFOL  N/A 01/01/2017   Procedure: ESOPHAGOGASTRODUODENOSCOPY (EGD) WITH PROPOFOL ;  Surgeon: Therisa Bi, MD;  Location: [facility] ENDOSCOPY;  Service: Endoscopy;  Laterality: N/A;   ESOPHAGOGASTRODUODENOSCOPY (EGD) WITH PROPOFOL  N/A 12/11/2021   Procedure: ESOPHAGOGASTRODUODENOSCOPY (EGD) WITH PROPOFOL ;  Surgeon: Unk Corinn Skiff, MD;  Location: [facility] ENDOSCOPY;  Service: Gastroenterology;  Laterality: N/A;   INTRATHECAL PUMP IMPLANT     LEFT HEART CATH AND CORONARY ANGIOGRAPHY N/A 04/28/2017   Procedure: LEFT HEART CATH AND CORONARY ANGIOGRAPHY;  Surgeon: Mady Bruckner, MD;  Location: [facility] INVASIVE CV LAB;  Service: Cardiovascular;  Laterality: N/A;   MASTECTOMY Right 06/1997   morphine  pump  2011   PLANTAR FASCIA RELEASE     TOTAL HIP ARTHROPLASTY Left    TRIGGER FINGER RELEASE       Social History:   reports that she quit smoking about 19 years ago. Her smoking use included cigarettes. She started smoking about 49 years ago. She has a 30 pack-year smoking history. She has never used smokeless tobacco. She reports that she does not drink alcohol  and does not use drugs.   Family History:  Her family history includes Arthritis in her paternal grandfather; Cancer in her paternal grandmother and sister; Cancer (age of onset: 57) in  her father; Cancer (age of onset: 80) in her mother; Hyperlipidemia in her sister and son; Hypertension in her sister; Osteoporosis in her maternal grandmother; Seizures in her son; Thyroid  disease in her mother. There is no history of Breast cancer.   Allergies Allergies  Allergen Reactions   Other Palpitations    IV steroids   Pain Patch [Menthol] Anaphylaxis   Avelox  [Moxifloxacin  Hcl In Nacl] Other (See Comments)    Upset stomach   Doxycycline  Diarrhea   Erythromycin  Nausea Only and  Other (See Comments)    Can take a Z-Pak just fine Other reaction(s): Other (See Comments) Can take Z-Pak  Can take a Z-Pak just fine   Fentanyl  Nausea Only and Rash   Moxifloxacin  Hcl Other (See Comments)    Upset stomach Upset stomach   Oxycontin [Oxycodone] Hives   Ozempic [Semaglutide] Nausea Only     Home Medications  Prior to Admission medications   Medication Sig Start Date End Date Taking? Authorizing Provider  ACCU-CHEK AVIVA PLUS test strip TEST THREE TIMES DAILY 05/11/21   [name], Jolene T, NP  Accu-Chek Softclix Lancets lancets TEST BLOOD SUGAR THREE TIMES DAILY 02/26/23   [name], Jolene T, NP  albuterol  (PROVENTIL  HFA;VENTOLIN  HFA) 108 (90 BASE) MCG/ACT inhaler Inhale 2 puffs into the lungs every 6 (six) hours as needed for wheezing or shortness of breath.     [provider]  albuterol  (PROVENTIL ) (2.5 MG/3ML) 0.083% nebulizer solution Take 3 mLs (2.5 mg total) by nebulization every 4 (four) hours as needed for wheezing or shortness of breath. 07/18/22 08/04/23  Raenelle Coria, MD  Alcohol  Swabs (DROPSAFE ALCOHOL  PREP) 70 % PADS USE TWICE DAILY  WITH  SUGAR  CHECKS 11/14/21   [name], Jolene T, NP  apixaban  (ELIQUIS ) 5 MG TABS tablet Take 1 tablet (5 mg total) by mouth 2 (two) times daily. 06/24/22   [name], Jolene T, NP  atorvastatin  (LIPITOR) 40 MG tablet Take 1 tablet by mouth once daily 06/08/23   [name], Jolene T, NP  Blood Glucose Monitoring Suppl (TRUE METRIX METER) w/Device KIT Use to check blood sugar 4 times a day 05/14/21   [name], Jolene T, NP  busPIRone  (BUSPAR ) 5 MG tablet Take 1 tablet by mouth twice daily 06/02/23   [name], Jolene T, NP  cholecalciferol  (VITAMIN D3) 25 MCG (1000 UNIT) tablet Take 1,000 Units by mouth daily.    [provider]  Continuous Glucose Receiver (FREESTYLE LIBRE 2 READER) DEVI To check blood sugars 3-4 times daily due to insulin  use. DX E11.40 12/11/22   [name], Melanie T, NP  Continuous Glucose Sensor (FREESTYLE  LIBRE 2 SENSOR) MISC To check blood sugars 3-4 times daily due to insulin  use. DX E11.40 12/30/22   [name], Melanie T, NP  Cyanocobalamin  1000 MCG/ML KIT Inject 1,000 mcg as directed every 30 (thirty) days.    [provider]  cyclobenzaprine  (FLEXERIL ) 5 MG tablet Take 5 mg by mouth 2 (two) times daily as needed for muscle spasms. Takes very rarely    [provider]  Fluticasone -Umeclidin-Vilant (TRELEGY ELLIPTA ) 100-62.5-25 MCG/ACT AEPB INHALE 1 PUFF INTO THE LUNGS DAILY -42 DAY EXPIRATION AFTER OPENING FOIL TRAY 02/13/23   [name], Jolene T, NP  folic acid  (FOLVITE ) 1 MG tablet TAKE 1 TABLET EVERY DAY 10/14/21   Dasie Tinnie MATSU, NP  furosemide  (LASIX ) 40 MG tablet Take 0.5 tablets (20 mg total) by mouth daily. 07/27/23   [name], Jolene T, NP  gabapentin  (NEURONTIN ) 800 MG tablet Take 1 tablet (800 mg  total) by mouth 3 (three) times daily. 06/12/23   [name], Jolene T, NP  HYDROcodone -acetaminophen  (NORCO) 10-325 MG tablet Take 1 tablet by mouth every 4 (four) hours as needed.    [provider]  insulin  lispro (HUMALOG  KWIKPEN) 100 UNIT/ML KwikPen Inject 5 Units into the skin 3 (three) times daily before meals. Do not inject insulin  if you are not going to eat meal.  Only take before a meal. 12/11/22   [name], Jolene T, NP  losartan  (COZAAR ) 25 MG tablet Take 0.5 tablets (12.5 mg total) by mouth daily. 06/12/23   [name], Jolene T, NP  metFORMIN  (GLUCOPHAGE ) 1000 MG tablet Take 1 tablet (1,000 mg total) by mouth 2 (two) times daily with a meal. 06/12/23   [name], Jolene T, NP  metoprolol  succinate (TOPROL -XL) 25 MG 24 hr tablet Take 0.5 tablets (12.5 mg total) by mouth daily. 06/12/23   [name], Jolene T, NP  montelukast  (SINGULAIR ) 10 MG tablet Take 1 tablet (10 mg total) by mouth at bedtime. 06/12/23   [name], Jolene T, NP  naloxone  (NARCAN ) nasal spray 4 mg/0.1 mL Place 1 spray into the nose as needed for up to 365 doses (for opioid-induced respiratory depresssion). In case  of emergency (overdose), spray once into each nostril. If no response within 3 minutes, repeat application and call 911. 08/04/23 08/03/24  Tanya Glisson, MD  Nebulizers (EASY AIR COMPRESSOR NEBULIZER) MISC Use to inhaler nebulizer treatments at needed per instructions on nebulizer prescription 07/21/22   [name], Jolene T, NP  ondansetron  (ZOFRAN ) 4 MG tablet Take 1 tablet (4 mg total) by mouth daily as needed for nausea or vomiting. 03/14/23 03/13/24  Cyrena Mylar, MD  OXYGEN  Inhale 2 L into the lungs at bedtime.    [provider]  PAIN MANAGEMENT INTRATHECAL, IT, PUMP 1 each by Intrathecal route. Intrathecal (IT) medication:  Morphine  5.0 mg/ml, Bupivacaine 20.0 mg/ml,  Clonidine  100.0 mcg/ml Patient does not remember current. Adjusted every 3 months at [facility]    [provider]  pantoprazole  (PROTONIX ) 40 MG tablet Take 1 tablet by mouth once daily 05/26/23   [name], Jolene T, NP  TRESIBA  FLEXTOUCH 100 UNIT/ML FlexTouch Pen INJECT 55 UNITS SUBCUTANEOUSLY AT BEDTIME 06/15/23   [name], Jolene T, NP  TRULICITY  4.5 MG/0.5ML SOPN INJECT 4.5MG  INTO THE SKIN ONCE WEEKLY AS DIRECTED 05/01/23   [name], Jolene T, NP  venlafaxine  XR (EFFEXOR -XR) 150 MG 24 hr capsule Take 1 capsule by mouth once daily 07/28/23   [name], Jolene T, NP     Critical care time:  61 minutes    [name], MD [facility] 08/15/2023 10:31 AM

## 2023-08-15 NOTE — Progress Notes (Signed)
 PHARMACY - ANTICOAGULATION CONSULT NOTE  Pharmacy Consult for Heparin   Indication: atrial fibrillation and pulmonary embolus  Patient Measurements: Height: 4' 10.27 (148 cm) Weight: 70 kg (154 lb 5.2 oz) IBW/kg (Calculated) : 41.52 Heparin  Dosing Weight: 56.7 kg   Labs: Recent Labs    08/13/23 0405 08/13/23 2051 08/14/23 0408 08/14/23 1354 08/14/23 2138 08/15/23 0433  HGB 7.4*  --  7.5*  --   --  7.5*  HCT 23.3*  --  24.2*  --   --  24.2*  PLT 339  --  366  --   --  313  APTT 50*   < > 46* 74* 69* 103*  HEPARINUNFRC 0.35  --  0.49  --   --  0.99*  CREATININE 0.99  --  0.92  --   --  0.85   < > = values in this interval not displayed.    Estimated Creatinine Clearance: 49.2 mL/min (by C-G formula based on SCr of 0.85 mg/dL).   Medical History: Past Medical History:  Diagnosis Date   Arthritis    Asthma    Atrial fibrillation (HCC)    Breast cancer (HCC) 1998   right breast ca with mastectomy and chemotherapy and radiation   Bronchitis    CHF (congestive heart failure) (HCC)    with Morphine  withdrawal   COPD (chronic obstructive pulmonary disease) (HCC)    Diabetes mellitus without complication (HCC)    Diverticulitis    diverticulosis also   Dyspnea    Endometriosis    GERD (gastroesophageal reflux disease)    History of shingles 2000-2005   Hypercholesteremia    Hypertension    IBS (irritable bowel syndrome)    Low back pain    a. Implanted morphine /bupivicaine/clonidine  pump.   Neuropathy    Orthopnea    Oxygen  dependent    uses at night   Personal history of chemotherapy    Personal history of radiation therapy    Pneumonia    pneumonia 5-6 times, history of bronchitis also   Scoliosis    Sleep apnea    does not use cpap   Stroke (HCC) 2010   TIA, 10 years ago   Withdrawal from sedative drug (HCC)    withdrawal from morphine  when pump batteries died   Assessment: Pharmacy consulted to dose heparin  in this 73 year old female admitted with  respiratory failure, possible PE.   Pt was on Eliquis  5 mg PO BID PTA for Afib.   Unsure of last dose.  1/5 1741 aPTT 55 1/6 0304 aPTT 194,  HL >1.10 1/6 1556 aPTT 59  Subtherapeutic 1/7 0150 aPTT 93, therapeutic x 1 1/7 1949 aPTT 82, therapeutic x 2 1/8 0407 aPTT 73, therapeutic x 3 / HL 0.46 / Hgb 9.8 > 8.1 1/9 2051 aPTT 70, therapeutic 1/10 0408 aPTT 46, subtherapeutic / HL 0.49 not correlating 1/10 2138 aPTT 69, therapeutic x 2 1/11 0433 aPTT 103, slightly supratherapeutic / HL 0.99  Goal of Therapy:  aPTT : 66 - 102  Heparin  level 0.3-0.7 units/ml Monitor platelets by anticoagulation protocol: Yes   Plan:   --Decrease heparin  infusion to 1400 units/hour --Recheck aPTT in 8 hrs after rate change --Recheck heparin  level once daily while on heparin  (we will continue to use aPTT to guide therapy until aPTT and heparin  level correlate) --Monitor CBC and signs/symptoms of bleeding, Hgb trending down.  Thank you for involving pharmacy in this patient's care.   Rankin CANDIE Dills, PharmD, [facility] 08/15/2023 5:42 AM

## 2023-08-15 NOTE — Progress Notes (Signed)
 PHARMACY CONSULT NOTE - FOLLOW UP  Pharmacy Consult for Electrolyte Monitoring and Replacement   Recent Labs: Potassium (mmol/L)  Date Value  08/15/2023 3.5  09/14/2013 3.5   Magnesium  (mg/dL)  Date Value  98/88/7974 1.9   Calcium  (mg/dL)  Date Value  98/88/7974 8.3 (L)   Calcium , Total (mg/dL)  Date Value  97/88/7984 9.0   Albumin (g/dL)  Date Value  98/88/7974 2.1 (L)  06/17/2023 4.2  09/14/2013 3.2 (L)   Phosphorus (mg/dL)  Date Value  98/88/7974 3.5   Sodium (mmol/L)  Date Value  08/15/2023 141  06/17/2023 138  09/14/2013 133 (L)    Assessment: 72 YOF w/ PMH of CHF , chronic pain syndrome with intrathecal pump, breast cancer hx, COPD, DM, recurrent exacerbation of COPD now coming in with shock due to sepsis with pneumonia and bilateral PE. Pharmacy is asked to follow and replace electrolytes while in CCU  Diuretics: 40 mg IV furosemide  x 1 01/10 Fluids: free water  30 ml q4H.   Goal of Therapy:  Electrolytes WNL  Plan:  Kcl 20 mEq x 1 per tube.  F/u with Am labs.   Cathaleen GORMAN Blanch ,PharmD Clinical Pharmacist 08/15/2023 9:51 AM

## 2023-08-15 NOTE — Plan of Care (Signed)
  Problem: Clinical Measurements: Goal: Ability to maintain clinical measurements within normal limits will improve Outcome: Progressing Goal: Will remain free from infection Outcome: Progressing Goal: Diagnostic test results will improve Outcome: Progressing Goal: Cardiovascular complication will be avoided Outcome: Progressing   Problem: Nutrition: Goal: Adequate nutrition will be maintained Outcome: Progressing   Problem: Elimination: Goal: Will not experience complications related to bowel motility Outcome: Not Progressing

## 2023-08-15 NOTE — Progress Notes (Signed)
 Patient was successfully extubated to HHFNC 35L and 35%, per MD order.

## 2023-08-15 NOTE — Plan of Care (Signed)
  Problem: Education: Goal: Knowledge of General Education information will improve Description: Including pain rating scale, medication(s)/side effects and non-pharmacologic comfort measures Outcome: Progressing   Problem: Health Behavior/Discharge Planning: Goal: Ability to manage health-related needs will improve Outcome: Progressing   Problem: Clinical Measurements: Goal: Ability to maintain clinical measurements within normal limits will improve Outcome: Progressing Goal: Will remain free from infection Outcome: Progressing Goal: Diagnostic test results will improve Outcome: Progressing Goal: Respiratory complications will improve Outcome: Progressing Goal: Cardiovascular complication will be avoided Outcome: Progressing   Problem: Activity: Goal: Risk for activity intolerance will decrease Outcome: Progressing   Problem: Nutrition: Goal: Adequate nutrition will be maintained Outcome: Progressing   Problem: Coping: Goal: Level of anxiety will decrease Outcome: Progressing   Problem: Elimination: Goal: Will not experience complications related to bowel motility Outcome: Progressing Goal: Will not experience complications related to urinary retention Outcome: Progressing   Problem: Pain Management: Goal: General experience of comfort will improve Outcome: Progressing   Problem: Safety: Goal: Ability to remain free from injury will improve Outcome: Progressing   Problem: Skin Integrity: Goal: Risk for impaired skin integrity will decrease Outcome: Progressing   Problem: Education: Goal: Ability to describe self-care measures that may prevent or decrease complications (Diabetes Survival Skills Education) will improve Outcome: Progressing   Problem: Coping: Goal: Ability to adjust to condition or change in health will improve Outcome: Progressing   Problem: Fluid Volume: Goal: Ability to maintain a balanced intake and output will improve Outcome:  Progressing   Problem: Health Behavior/Discharge Planning: Goal: Ability to identify and utilize available resources and services will improve Outcome: Progressing Goal: Ability to manage health-related needs will improve Outcome: Progressing   Problem: Metabolic: Goal: Ability to maintain appropriate glucose levels will improve Outcome: Progressing   Problem: Nutritional: Goal: Maintenance of adequate nutrition will improve Outcome: Progressing Goal: Progress toward achieving an optimal weight will improve Outcome: Progressing   Problem: Skin Integrity: Goal: Risk for impaired skin integrity will decrease Outcome: Progressing   Problem: Tissue Perfusion: Goal: Adequacy of tissue perfusion will improve Outcome: Progressing   Problem: Education: Goal: Ability to describe self-care measures that may prevent or decrease complications (Diabetes Survival Skills Education) will improve Outcome: Progressing   Problem: Cardiac: Goal: Ability to maintain an adequate cardiac output will improve Outcome: Progressing   Problem: Health Behavior/Discharge Planning: Goal: Ability to identify and utilize available resources and services will improve Outcome: Progressing Goal: Ability to manage health-related needs will improve Outcome: Progressing   Problem: Fluid Volume: Goal: Ability to achieve a balanced intake and output will improve Outcome: Progressing   Problem: Metabolic: Goal: Ability to maintain appropriate glucose levels will improve Outcome: Progressing   Problem: Nutritional: Goal: Maintenance of adequate nutrition will improve Outcome: Progressing Goal: Maintenance of adequate weight for body size and type will improve Outcome: Progressing   Problem: Respiratory: Goal: Will regain and/or maintain adequate ventilation Outcome: Progressing   Problem: Urinary Elimination: Goal: Ability to achieve and maintain adequate renal perfusion and functioning will  improve Outcome: Progressing

## 2023-08-16 DIAGNOSIS — I2694 Multiple subsegmental pulmonary emboli without acute cor pulmonale: Secondary | ICD-10-CM | POA: Diagnosis not present

## 2023-08-16 DIAGNOSIS — J9622 Acute and chronic respiratory failure with hypercapnia: Secondary | ICD-10-CM | POA: Diagnosis not present

## 2023-08-16 DIAGNOSIS — J9621 Acute and chronic respiratory failure with hypoxia: Secondary | ICD-10-CM | POA: Diagnosis not present

## 2023-08-16 DIAGNOSIS — J189 Pneumonia, unspecified organism: Secondary | ICD-10-CM | POA: Diagnosis not present

## 2023-08-16 LAB — HEPARIN LEVEL (UNFRACTIONATED): Heparin Unfractionated: 0.84 [IU]/mL — ABNORMAL HIGH (ref 0.30–0.70)

## 2023-08-16 LAB — CBC
HCT: 24.5 % — ABNORMAL LOW (ref 36.0–46.0)
Hemoglobin: 7.7 g/dL — ABNORMAL LOW (ref 12.0–15.0)
MCH: 28.8 pg (ref 26.0–34.0)
MCHC: 31.4 g/dL (ref 30.0–36.0)
MCV: 91.8 fL (ref 80.0–100.0)
Platelets: 343 10*3/uL (ref 150–400)
RBC: 2.67 MIL/uL — ABNORMAL LOW (ref 3.87–5.11)
RDW: 16.5 % — ABNORMAL HIGH (ref 11.5–15.5)
WBC: 12.9 10*3/uL — ABNORMAL HIGH (ref 4.0–10.5)
nRBC: 0 % (ref 0.0–0.2)

## 2023-08-16 LAB — GLUCOSE, CAPILLARY
Glucose-Capillary: 105 mg/dL — ABNORMAL HIGH (ref 70–99)
Glucose-Capillary: 106 mg/dL — ABNORMAL HIGH (ref 70–99)
Glucose-Capillary: 163 mg/dL — ABNORMAL HIGH (ref 70–99)
Glucose-Capillary: 167 mg/dL — ABNORMAL HIGH (ref 70–99)
Glucose-Capillary: 173 mg/dL — ABNORMAL HIGH (ref 70–99)
Glucose-Capillary: 178 mg/dL — ABNORMAL HIGH (ref 70–99)
Glucose-Capillary: 72 mg/dL (ref 70–99)

## 2023-08-16 LAB — APTT
aPTT: 106 s — ABNORMAL HIGH (ref 24–36)
aPTT: 127 s — ABNORMAL HIGH (ref 24–36)
aPTT: 77 s — ABNORMAL HIGH (ref 24–36)

## 2023-08-16 LAB — BASIC METABOLIC PANEL
Anion gap: 8 (ref 5–15)
BUN: 31 mg/dL — ABNORMAL HIGH (ref 8–23)
CO2: 29 mmol/L (ref 22–32)
Calcium: 8.2 mg/dL — ABNORMAL LOW (ref 8.9–10.3)
Chloride: 101 mmol/L (ref 98–111)
Creatinine, Ser: 0.69 mg/dL (ref 0.44–1.00)
GFR, Estimated: >60 mL/min (ref >60)
Glucose, Bld: 187 mg/dL — ABNORMAL HIGH (ref 70–99)
Potassium: 3.3 mmol/L — ABNORMAL LOW (ref 3.5–5.1)
Sodium: 138 mmol/L (ref 135–145)

## 2023-08-16 MED ORDER — DOCUSATE SODIUM 100 MG PO CAPS
100.0000 mg | ORAL_CAPSULE | Freq: Two times a day (BID) | ORAL | Status: DC | PRN
  Administered 2023-08-17: 100 mg via ORAL
  Filled 2023-08-16: qty 1

## 2023-08-16 MED ORDER — ACETAMINOPHEN 325 MG PO TABS
975.0000 mg | ORAL_TABLET | Freq: Four times a day (QID) | ORAL | Status: DC | PRN
  Administered 2023-08-18 – 2023-08-19 (×2): 975 mg via ORAL
  Filled 2023-08-16 (×2): qty 3

## 2023-08-16 MED ORDER — POTASSIUM CHLORIDE 10 MEQ/100ML IV SOLN
10.0000 meq | INTRAVENOUS | Status: AC
Start: 2023-08-16 — End: 2023-08-16
  Administered 2023-08-16 (×2): 10 meq via INTRAVENOUS
  Filled 2023-08-16 (×2): qty 100

## 2023-08-16 MED ORDER — QUETIAPINE FUMARATE 25 MG PO TABS
75.0000 mg | ORAL_TABLET | Freq: Every day | ORAL | Status: AC
  Administered 2023-08-16: 75 mg via ORAL
  Filled 2023-08-16: qty 3

## 2023-08-16 MED ORDER — PROCHLORPERAZINE EDISYLATE 10 MG/2ML IJ SOLN
10.0000 mg | Freq: Once | INTRAMUSCULAR | Status: AC
  Administered 2023-08-16: 10 mg via INTRAVENOUS
  Filled 2023-08-16: qty 2

## 2023-08-16 MED ORDER — GABAPENTIN 400 MG PO CAPS
400.0000 mg | ORAL_CAPSULE | Freq: Three times a day (TID) | ORAL | Status: DC
  Administered 2023-08-16 – 2023-08-22 (×18): 400 mg via ORAL
  Filled 2023-08-16 (×18): qty 1

## 2023-08-16 MED ORDER — ONDANSETRON HCL 4 MG/2ML IJ SOLN
4.0000 mg | Freq: Four times a day (QID) | INTRAMUSCULAR | Status: DC | PRN
  Administered 2023-08-17 – 2023-08-18 (×2): 4 mg via INTRAVENOUS
  Filled 2023-08-16 (×2): qty 2

## 2023-08-16 MED ORDER — ACETAMINOPHEN 325 MG PO TABS
650.0000 mg | ORAL_TABLET | Freq: Four times a day (QID) | ORAL | Status: DC | PRN
  Administered 2023-08-16: 650 mg via ORAL
  Filled 2023-08-16: qty 2

## 2023-08-16 NOTE — Progress Notes (Signed)
 PHARMACY - ANTICOAGULATION CONSULT NOTE  Pharmacy Consult for Heparin   Indication: atrial fibrillation and pulmonary embolus  Patient Measurements: Height: 4' 10.27 (148 cm) Weight: 70 kg (154 lb 5.2 oz) IBW/kg (Calculated) : 41.52 Heparin  Dosing Weight: 56.7 kg   Labs: Recent Labs    08/13/23 0405 08/13/23 2051 08/14/23 0408 08/14/23 1354 08/15/23 0433 08/15/23 1351 08/16/23 0003  HGB 7.4*  --  7.5*  --  7.5*  --   --   HCT 23.3*  --  24.2*  --  24.2*  --   --   PLT 339  --  366  --  313  --   --   APTT 50*   < > 46*   < > 103* 64* 106*  HEPARINUNFRC 0.35  --  0.49  --  0.99*  --  0.84*  CREATININE 0.99  --  0.92  --  0.85  --   --    < > = values in this interval not displayed.    Estimated Creatinine Clearance: 49.2 mL/min (by C-G formula based on SCr of 0.85 mg/dL).  Medical History: Past Medical History:  Diagnosis Date   Arthritis    Asthma    Atrial fibrillation (HCC)    Breast cancer (HCC) 1998   right breast ca with mastectomy and chemotherapy and radiation   Bronchitis    CHF (congestive heart failure) (HCC)    with Morphine  withdrawal   COPD (chronic obstructive pulmonary disease) (HCC)    Diabetes mellitus without complication (HCC)    Diverticulitis    diverticulosis also   Dyspnea    Endometriosis    GERD (gastroesophageal reflux disease)    History of shingles 2000-2005   Hypercholesteremia    Hypertension    IBS (irritable bowel syndrome)    Low back pain    a. Implanted morphine /bupivicaine/clonidine  pump.   Neuropathy    Orthopnea    Oxygen  dependent    uses at night   Personal history of chemotherapy    Personal history of radiation therapy    Pneumonia    pneumonia 5-6 times, history of bronchitis also   Scoliosis    Sleep apnea    does not use cpap   Stroke (HCC) 2010   TIA, 10 years ago   Withdrawal from sedative drug (HCC)    withdrawal from morphine  when pump batteries died   Assessment: Pharmacy consulted to dose  heparin  in this 73 year old female admitted with respiratory failure, possible PE.   Pt was on Eliquis  5 mg PO BID PTA for Afib.   Unsure of last dose.  1/5 1741 aPTT 55 1/6 0304 aPTT 194,  HL >1.10 1/6 1556 aPTT 59  Subtherapeutic 1/7 0150 aPTT 93, therapeutic x 1 1/7 1949 aPTT 82, therapeutic x 2 1/8 0407 aPTT 73, therapeutic x 3 / HL 0.46 / Hgb 9.8 > 8.1 1/9 2051 aPTT 70, therapeutic 1/10 0408 aPTT 46, subtherapeutic / HL 0.49 not correlating 1/10 2138 aPTT 69, therapeutic x 2 1/11 0433 aPTT 103, slightly supratherapeutic / HL 0.99 1/11 1351 aPTT 64, SUBtherapeutic  1/12 0003 aPTT 106, supratherapeutic  Goal of Therapy:  aPTT : 66 - 102  Heparin  level 0.3-0.7 units/ml Monitor platelets by anticoagulation protocol: Yes   Plan:   Decrease heparin  infusion to 1,450 units/hr Recheck aPTT in 8 hrs after rate change Recheck heparin  level once daily while on heparin  (we will continue to use aPTT to guide therapy until aPTT and heparin  level correlate)  Monitor CBC and signs/symptoms of bleeding, Hgb trending down.  Thank you for involving pharmacy in this patient's care.   Rankin CANDIE Dills, PharmD, MBA 08/16/2023 1:30 AM

## 2023-08-16 NOTE — Progress Notes (Signed)
 NAME:  Jordan Chan, MRN:  [id number], DOB:  01-18-1951, LOS: 7 ADMISSION DATE:  08/09/2023, CHIEF COMPLAINT:  Acute Hypoxic Respiratory Fialure   History of Present Illness:   73 y.o female with significant PMH of B12 deficiency, chronic pain syndrome s/p intrathecal pump, paroxysmal atrial fibrillation, stress-induced cardiomyopathy in the setting of pain pump malfunction and opioid withdrawal, PE following hospitalization for pneumonia, DM, COPD, GERD, OSA, and IBS who presented to the ED with chief complaints of acute respiratory distress found with sats in the 50 to 60% on RA per EMS.   ED Course: Initial vital signs showed HR of 142 beats/minute, BP mm Hg, the RR 23 breaths/minute, and the oxygen  saturation 100% on BVM and a temperature of 92.71F (33.3C).  Patient was intubated for airway protection. Pertinent Labs/Diagnostics Findings: Na+/ K+: 136/4.2 glucose: 418 BUN/Cr.:  13/1.23 AST/ALT: 97/51, CO2 14, anion gap 20 WBC: 25.2 K/L without bands or neutrophil predominance, Plts: 530 PCT:  Lactic acid: >9.0 COVID PCR: Negative,  troponin: 20  BNP:241.9 ABG: pO2 96; pCO2 51; pH 7.03;  HCO3 13.5, %O2 Sat 96.4.  CXR> see result ED Medications: Patient treated with Duonebs, Solumedrol 125 mg x 1, 30 cc/kg of fluids and started on broad-spectrum antibiotics Vanco cefepime  and Flagyl  for suspected sepsis    Pertinent  Medical History   B12 deficiency, chronic pain syndrome s/p intrathecal pump, paroxysmal atrial fibrillation, stress-induced cardiomyopathy in the setting of pain pump malfunction and opioid withdrawal, PE following hospitalization for pneumonia, DM, COPD, GERD, OSA, and IBS   Significant Hospital Events: Including procedures, antibiotic start and stop dates in addition to other pertinent events   1/5: Admitted to ICU with acute hypoxic hypercapnic respiratory failure secondary to suspected pneumonia and AECOPD. 1/6: failed SBT, hypertensive off sedation. Failed  switch to dexmedetomidine  1/7: failed extubation, had to be re-intubated at the bedside 1/8: switched to full code, switch fentanyl  to hydromorphone  1/9: remains on mechanical ventilation, better sedated 1/10: add precedex , re-attempt SBT 1/11: SBT on precedex  successful, extubated to HFNC 1/12:   Interim History / Subjective:  Awake and interactive this AM. Follows commands.  Objective   Blood pressure (!) 119/48, pulse (!) 51, temperature (!) 95.4 F (35.2 C), resp. rate 16, height 4' 10.27 (1.48 m), weight 72 kg, SpO2 98%.    Vent Mode: PSV FiO2 (%):  [28 %-35 %] 35 % Vt Set:  [400 mL] 400 mL PEEP:  [5 cmH20] 5 cmH20 Pressure Support:  [5 cmH20] 5 cmH20 Plateau Pressure:  [12 cmH20] 12 cmH20   Intake/Output Summary (Last 24 hours) at 08/16/2023 0737 Last data filed at 08/16/2023 0600 Gross per 24 hour  Intake 1114.44 ml  Output 2035 ml  Net -920.56 ml   Filed Weights   08/13/23 0330 08/15/23 0500 08/16/23 0500  Weight: 69.6 kg 70 kg 72 kg    Examination: Physical Exam Constitutional:      General: She is not in acute distress.    Appearance: She is ill-appearing.  HENT:     Head: Normocephalic.     Mouth/Throat:     Comments: ETT in place Cardiovascular:     Rate and Rhythm: Normal rate and regular rhythm.     Pulses: Normal pulses.     Heart sounds: Normal heart sounds.  Pulmonary:     Effort: No respiratory distress.     Breath sounds: Rhonchi present. No wheezing.  Abdominal:     Palpations: Abdomen is soft.  Neurological:  Comments: Awake and alert, follows commands. Oriented to self and place      Assessment & Plan:   Neurology #Toxic Metabolic Encephalopathy #Chronic Pain   Patient is maintained on an intra-thecal pump with morphine  and clonidine  and bupivacaine. Given pump chronicity, will hold off on making any changes unless absolutely necessary. Patient with significant pain at baseline and is also maintained on buspirone  and gabapentin .  Discontinued all sedation following extubation.   -resume gabapentin   Cardiovascular #Circulatory Shock #Stress induced cardiomyopathy #Paroxysmal afib #HFrEF   Sedation related hypotensive briskly improved (with resultant hypertension during wake up assessment). Have intermittently diuresed her with IV furosemide  to good effect. Holding today as she remains NPO and respiratory status is significantly improved. Sinus tachycardia at baseline maintained on metoprolol , now held. BNP noted to be elevated at 241 suggesting a component of decompensated heart failure. Will hold off on GDMT at the moment and re-instate as her hemodynamics improve.   -on heparin  gtt for PE and afib -hold diuresis today -hold off on GDMT -repeat TTE shows LVEF of 25%   Pulmonary #Acute Hypoxic Respiratory Failure #Pulmonary Embolism #Bilateral Pleural EFfusions  Noted to have bilateral consolidative opacities, bilateral small layering pleural effusions, as well as bilaterally consolidated/collapsed lower lobes. Effusions are small and simple appearing on POCUS, with underlying consolidated lower lobes. Respiratory failure likely secondary to a combination of pneumonia, pulmonary embolism, and decompensated heart failure. Extubated to HFNC yesterday, with significant improvement in overall status. Will wean to regular nasal cannula today  -switch to Corinne, goal SpO2 > 92%   Gastrointestinal   D/c PPI, SLP eval and advance diet accordingly   Renal #AKI   Kidney function mildly elevated on presentation, improved on repeat. Have been providing spot diuresis with IV furosemide , holding today. Monitoring kidney function and avoiding nephrotoxins  Endocrine   ICU glycemic protocol  -continue to taper hydrocortisone , d/c today -continue insulin  glargine and sliding scale   Hem/Onc #VTE   She is maintained on apixaban  for history of PE and for afib and is noted to have acute PE on presentation. This is likely  secondary to medication non-compliance. Continue heparin  for now, will consider hematology consult prior to discharge.   -Continue heparin  gtt for PE -monitor H/H and platelets   ID   #CAP   Continue with broad spectrum antibiotics. Negative viral panel. Negative MRSA screen, negative strep pneumonia and legionella antigens. Will continue IV azithromycin  for given severity on pneumonia   -follow up tracheal aspirate -on Zosyn  and azithromycin    Best Practice (right click and Reselect all SmartList Selections daily)   Diet/type: NPO DVT prophylaxis systemic heparin  Pressure ulcer(s): N/A GI prophylaxis: N/A Lines: Central line and yes and it is still needed Foley:  Yes, and it is still needed Code Status:  full code Last date of multidisciplinary goals of care discussion [08/16/2023]  Labs   CBC: Recent Labs  Lab 08/11/23 0937 08/12/23 0407 08/13/23 0405 08/14/23 0408 08/15/23 0433 08/16/23 0546  WBC 28.4* 25.0* 17.8* 15.5* 11.8* 12.9*  NEUTROABS 23.9*  --   --   --   --   --   HGB 9.8* 8.1* 7.4* 7.5* 7.5* 7.7*  HCT 30.4* 25.8* 23.3* 24.2* 24.2* 24.5*  MCV 87.6 90.2 90.3 93.1 92.0 91.8  PLT 329 347 339 366 313 343    Basic Metabolic Panel: Recent Labs  Lab 08/11/23 0153 08/12/23 0407 08/12/23 1544 08/13/23 0405 08/14/23 0408 08/15/23 0433 08/16/23 0546  NA 134* 135  --  140 142 141 138  K 4.0 3.2* 3.6 3.3* 4.2 3.5 3.3*  CL 102 102  --  107 108 105 101  CO2 24 23  --  24 25 28 29   GLUCOSE 113* 242*  --  327* 272* 255* 187*  BUN 19 24*  --  29* 36* 41* 31*  CREATININE 0.98 1.01*  --  0.99 0.92 0.85 0.69  CALCIUM  7.5* 8.0*  --  8.3* 8.4* 8.3* 8.2*  MG 2.1 2.0  --  2.2 2.1 1.9  --   PHOS 3.3 3.8  --  3.1 3.3 3.5  --    GFR: Estimated Creatinine Clearance: 53.1 mL/min (by C-G formula based on SCr of 0.69 mg/dL). Recent Labs  Lab 08/09/23 0942 08/09/23 1259 08/10/23 0304 08/10/23 1717 08/11/23 0153 08/11/23 9062 08/12/23 0407 08/13/23 0405  08/14/23 0408 08/15/23 0433 08/16/23 0546  PROCALCITON  --   --   --   --   --  3.10  --   --   --   --   --   WBC  --   --    < >  --    < > 28.4*   < > 17.8* 15.5* 11.8* 12.9*  LATICACIDVEN 4.2* 4.6*  --  1.0  --   --   --   --   --   --   --    < > = values in this interval not displayed.    Liver Function Tests: Recent Labs  Lab 08/10/23 0304 08/11/23 0153 08/12/23 0407 08/13/23 0405 08/15/23 0433  AST 38 23 16 11*  --   ALT 53* 37 28 23  --   ALKPHOS 80 71 78 71  --   BILITOT 0.3 0.4 0.5 0.4  --   PROT 5.0* 5.6* 5.8* 5.8*  --   ALBUMIN 2.4* 2.3* 2.3* 2.3* 2.1*   No results for input(s): LIPASE, AMYLASE in the last 168 hours. No results for input(s): AMMONIA in the last 168 hours.  ABG    Component Value Date/Time   PHART 7.38 08/11/2023 1046   PCO2ART 42 08/11/2023 1046   PO2ART 190 (H) 08/11/2023 1046   HCO3 24.8 08/11/2023 1046   ACIDBASEDEF 0.4 08/11/2023 1046   O2SAT 99.4 08/11/2023 1046     Coagulation Profile: No results for input(s): INR, PROTIME in the last 168 hours.   Cardiac Enzymes: No results for input(s): CKTOTAL, CKMB, CKMBINDEX, TROPONINI in the last 168 hours.  HbA1C: Hemoglobin A1C  Date/Time Value Ref Range Status  04/28/2016 12:00 AM 7.7%  Final   HB A1C (BAYER DCA - WAIVED)  Date/Time Value Ref Range Status  06/12/2023 09:28 AM 7.4 (H) 4.8 - 5.6 % Final    Comment:             Prediabetes: 5.7 - 6.4          Diabetes: >6.4          Glycemic control for adults with diabetes: <7.0   02/17/2023 01:36 PM 7.5 (H) 4.8 - 5.6 % Final    Comment:             Prediabetes: 5.7 - 6.4          Diabetes: >6.4          Glycemic control for adults with diabetes: <7.0    Hgb A1c MFr Bld  Date/Time Value Ref Range Status  04/16/2023 05:22 AM 6.7 (H) 4.8 - 5.6 % Final    Comment:    (  NOTE)         Prediabetes: 5.7 - 6.4         Diabetes: >6.4         Glycemic control for adults with diabetes: <7.0     CBG: Recent  Labs  Lab 08/15/23 1150 08/15/23 1552 08/15/23 2013 08/16/23 0009 08/16/23 0543  GLUCAP 173* 115* 191* 163* 173*    Review of Systems:   Unable to obtain  Past Medical History:  She,  has a past medical history of Arthritis, Asthma, Atrial fibrillation (HCC), Breast cancer (HCC) (1998), Bronchitis, CHF (congestive heart failure) (HCC), COPD (chronic obstructive pulmonary disease) (HCC), Diabetes mellitus without complication (HCC), Diverticulitis, Dyspnea, Endometriosis, GERD (gastroesophageal reflux disease), History of shingles (2000-2005), Hypercholesteremia, Hypertension, IBS (irritable bowel syndrome), Low back pain, Neuropathy, Orthopnea, Oxygen  dependent, Personal history of chemotherapy, Personal history of radiation therapy, Pneumonia, Scoliosis, Sleep apnea, Stroke (HCC) (2010), and 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   Surgical History:   Past Surgical History:  Procedure Laterality Date   ABDOMINAL HYSTERECTOMY  1987   BACK SURGERY     Tailbone removed following fracture   BREAST SURGERY Right    mastectomy   CARDIAC CATHETERIZATION     CATARACT EXTRACTION W/PHACO Right 05/19/2019   Procedure: CATARACT EXTRACTION PHACO AND INTRAOCULAR LENS PLACEMENT (IOC), RIGHT, DIABETIC;  Surgeon: Ferol Rogue, MD;  Location: [facility] ORS;  Service: Ophthalmology;  Laterality: Right;  Lot # [id number] H US : 00:43.8 CDE: 4.59   CATARACT EXTRACTION W/PHACO Left 06/16/2019   Procedure: CATARACT EXTRACTION PHACO AND INTRAOCULAR LENS PLACEMENT (IOC) LEFT VISION BLUE DIABETIC;  Surgeon: Ferol Rogue, MD;  Location: [facility] ORS;  Service: Ophthalmology;  Laterality: Left;  Lot #[id number] H US : 00:46.9 CDE: 6.53   COCCYX REMOVAL     COLONOSCOPY WITH PROPOFOL  N/A 01/01/2017   Procedure: COLONOSCOPY WITH PROPOFOL ;  Surgeon: Therisa Bi, MD;  Location: [facility] ENDOSCOPY;  Service: Endoscopy;  Laterality: N/A;   COLONOSCOPY WITH PROPOFOL  N/A 12/11/2021   Procedure: COLONOSCOPY WITH PROPOFOL ;  Surgeon:  Unk Corinn Skiff, MD;  Location: [facility] ENDOSCOPY;  Service: Gastroenterology;  Laterality: N/A;   ELBOW ARTHROSCOPY WITH TENDON RECONSTRUCTION     ESOPHAGOGASTRODUODENOSCOPY (EGD) WITH PROPOFOL  N/A 01/01/2017   Procedure: ESOPHAGOGASTRODUODENOSCOPY (EGD) WITH PROPOFOL ;  Surgeon: Therisa Bi, MD;  Location: [facility] ENDOSCOPY;  Service: Endoscopy;  Laterality: N/A;   ESOPHAGOGASTRODUODENOSCOPY (EGD) WITH PROPOFOL  N/A 12/11/2021   Procedure: ESOPHAGOGASTRODUODENOSCOPY (EGD) WITH PROPOFOL ;  Surgeon: Unk Corinn Skiff, MD;  Location: [facility] ENDOSCOPY;  Service: Gastroenterology;  Laterality: N/A;   INTRATHECAL PUMP IMPLANT     LEFT HEART CATH AND CORONARY ANGIOGRAPHY N/A 04/28/2017   Procedure: LEFT HEART CATH AND CORONARY ANGIOGRAPHY;  Surgeon: Mady Bruckner, MD;  Location: [facility] INVASIVE CV LAB;  Service: Cardiovascular;  Laterality: N/A;   MASTECTOMY Right 06/1997   morphine  pump  2011   PLANTAR FASCIA RELEASE     TOTAL HIP ARTHROPLASTY Left    TRIGGER FINGER RELEASE       Social History:   reports that she quit smoking about 19 years ago. Her smoking use included cigarettes. She started smoking about 49 years ago. She has a 30 pack-year smoking history. She has never used smokeless tobacco. She reports that she does not drink alcohol  and does not use drugs.   Family History:  Her family history includes Arthritis in her paternal grandfather; Cancer in her paternal grandmother and sister; Cancer (age of onset: 61) in her father; Cancer (age of onset: 4) in her mother; Hyperlipidemia in her sister and  son; Hypertension in her sister; Osteoporosis in her maternal grandmother; Seizures in her son; Thyroid  disease in her mother. There is no history of Breast cancer.   Allergies Allergies  Allergen Reactions   Other Palpitations    IV steroids   Pain Patch [Menthol] Anaphylaxis   Avelox  [Moxifloxacin  Hcl In Nacl] Other (See Comments)    Upset stomach   Doxycycline  Diarrhea   Erythromycin   Nausea Only and Other (See Comments)    Can take a Z-Pak just fine Other reaction(s): Other (See Comments) Can take Z-Pak  Can take a Z-Pak just fine   Fentanyl  Nausea Only and Rash   Moxifloxacin  Hcl Other (See Comments)    Upset stomach Upset stomach   Oxycontin [Oxycodone] Hives   Ozempic [Semaglutide] Nausea Only     Home Medications  Prior to Admission medications   Medication Sig Start Date End Date Taking? Authorizing Provider  ACCU-CHEK AVIVA PLUS test strip TEST THREE TIMES DAILY 05/11/21   [name], Jolene T, NP  Accu-Chek Softclix Lancets lancets TEST BLOOD SUGAR THREE TIMES DAILY 02/26/23   [name], Jolene T, NP  albuterol  (PROVENTIL  HFA;VENTOLIN  HFA) 108 (90 BASE) MCG/ACT inhaler Inhale 2 puffs into the lungs every 6 (six) hours as needed for wheezing or shortness of breath.     [provider]  albuterol  (PROVENTIL ) (2.5 MG/3ML) 0.083% nebulizer solution Take 3 mLs (2.5 mg total) by nebulization every 4 (four) hours as needed for wheezing or shortness of breath. 07/18/22 08/04/23  Raenelle Coria, MD  Alcohol  Swabs (DROPSAFE ALCOHOL  PREP) 70 % PADS USE TWICE DAILY  WITH  SUGAR  CHECKS 11/14/21   [name], Jolene T, NP  apixaban  (ELIQUIS ) 5 MG TABS tablet Take 1 tablet (5 mg total) by mouth 2 (two) times daily. 06/24/22   [name], Jolene T, NP  atorvastatin  (LIPITOR) 40 MG tablet Take 1 tablet by mouth once daily 06/08/23   [name], Jolene T, NP  Blood Glucose Monitoring Suppl (TRUE METRIX METER) w/Device KIT Use to check blood sugar 4 times a day 05/14/21   [name], Jolene T, NP  busPIRone  (BUSPAR ) 5 MG tablet Take 1 tablet by mouth twice daily 06/02/23   [name], Jolene T, NP  cholecalciferol  (VITAMIN D3) 25 MCG (1000 UNIT) tablet Take 1,000 Units by mouth daily.    [provider]  Continuous Glucose Receiver (FREESTYLE LIBRE 2 READER) DEVI To check blood sugars 3-4 times daily due to insulin  use. DX E11.40 12/11/22   [name], Melanie T, NP  Continuous Glucose  Sensor (FREESTYLE LIBRE 2 SENSOR) MISC To check blood sugars 3-4 times daily due to insulin  use. DX E11.40 12/30/22   [name], Melanie T, NP  Cyanocobalamin  1000 MCG/ML KIT Inject 1,000 mcg as directed every 30 (thirty) days.    [provider]  cyclobenzaprine  (FLEXERIL ) 5 MG tablet Take 5 mg by mouth 2 (two) times daily as needed for muscle spasms. Takes very rarely    [provider]  Fluticasone -Umeclidin-Vilant (TRELEGY ELLIPTA ) 100-62.5-25 MCG/ACT AEPB INHALE 1 PUFF INTO THE LUNGS DAILY -42 DAY EXPIRATION AFTER OPENING FOIL TRAY 02/13/23   [name], Jolene T, NP  folic acid  (FOLVITE ) 1 MG tablet TAKE 1 TABLET EVERY DAY 10/14/21   Allen, Lauren G, NP  furosemide  (LASIX ) 40 MG tablet Take 0.5 tablets (20 mg total) by mouth daily. 07/27/23   [name], Jolene T, NP  gabapentin  (NEURONTIN ) 800 MG tablet Take 1 tablet (800 mg total) by mouth 3 (three) times daily. 06/12/23   [name], Jolene T, NP  HYDROcodone -acetaminophen  (NORCO) 10-325 MG tablet Take 1 tablet by mouth every 4 (four) hours as needed.    [provider]  insulin  lispro (HUMALOG  KWIKPEN) 100 UNIT/ML KwikPen Inject 5 Units into the skin 3 (three) times daily before meals. Do not inject insulin  if you are not going to eat meal.  Only take before a meal. 12/11/22   [name], Jolene T, NP  losartan  (COZAAR ) 25 MG tablet Take 0.5 tablets (12.5 mg total) by mouth daily. 06/12/23   [name], Jolene T, NP  metFORMIN  (GLUCOPHAGE ) 1000 MG tablet Take 1 tablet (1,000 mg total) by mouth 2 (two) times daily with a meal. 06/12/23   [name], Jolene T, NP  metoprolol  succinate (TOPROL -XL) 25 MG 24 hr tablet Take 0.5 tablets (12.5 mg total) by mouth daily. 06/12/23   [name], Jolene T, NP  montelukast  (SINGULAIR ) 10 MG tablet Take 1 tablet (10 mg total) by mouth at bedtime. 06/12/23   [name], Jolene T, NP  naloxone  (NARCAN ) nasal spray 4 mg/0.1 mL Place 1 spray into the nose as needed for up to 365 doses (for opioid-induced respiratory  depresssion). In case of emergency (overdose), spray once into each nostril. If no response within 3 minutes, repeat application and call 911. 08/04/23 08/03/24  Tanya Glisson, MD  Nebulizers (EASY AIR COMPRESSOR NEBULIZER) MISC Use to inhaler nebulizer treatments at needed per instructions on nebulizer prescription 07/21/22   [name], Jolene T, NP  ondansetron  (ZOFRAN ) 4 MG tablet Take 1 tablet (4 mg total) by mouth daily as needed for nausea or vomiting. 03/14/23 03/13/24  Cyrena Mylar, MD  OXYGEN  Inhale 2 L into the lungs at bedtime.    [provider]  PAIN MANAGEMENT INTRATHECAL, IT, PUMP 1 each by Intrathecal route. Intrathecal (IT) medication:  Morphine  5.0 mg/ml, Bupivacaine 20.0 mg/ml,  Clonidine  100.0 mcg/ml Patient does not remember current. Adjusted every 3 months at [facility]    [provider]  pantoprazole  (PROTONIX ) 40 MG tablet Take 1 tablet by mouth once daily 05/26/23   [name], Jolene T, NP  TRESIBA  FLEXTOUCH 100 UNIT/ML FlexTouch Pen INJECT 55 UNITS SUBCUTANEOUSLY AT BEDTIME 06/15/23   [name], Jolene T, NP  TRULICITY  4.5 MG/0.5ML SOPN INJECT 4.5MG  INTO THE SKIN ONCE WEEKLY AS DIRECTED 05/01/23   [name], Jolene T, NP  venlafaxine  XR (EFFEXOR -XR) 150 MG 24 hr capsule Take 1 capsule by mouth once daily 07/28/23   [name], Jolene T, NP     Critical care time:  36 minutes    [name], MD [facility] 08/16/2023 9:27 AM

## 2023-08-16 NOTE — Progress Notes (Signed)
 Inpatient Rehab Admissions Coordinator:   Per therapy recommendations, patient was screened for CIR candidacy by Leita Kleine, MS, CCC-SLP. At this time, Pt. is not at a level tolerate the intensity of CIR; however,   Pt. may have potential to progress to becoming a potential CIR candidate, so CIR admissions team will follow and monitor for progress and participation with therapies and place consult order if Pt. appears to be an appropriate candidate. Please contact me with any questions.   Leita Kleine, MS, CCC-SLP Rehab Admissions Coordinator  [phone] (celll) [phone] (office)

## 2023-08-16 NOTE — Progress Notes (Signed)
 PHARMACY CONSULT NOTE  Pharmacy Consult for Electrolyte Monitoring and Replacement   Recent Labs: Potassium (mmol/L)  Date Value  08/16/2023 3.3 (L)  09/14/2013 3.5   Magnesium  (mg/dL)  Date Value  98/88/7974 1.9   Calcium  (mg/dL)  Date Value  98/87/7974 8.2 (L)   Calcium , Total (mg/dL)  Date Value  97/88/7984 9.0   Albumin (g/dL)  Date Value  98/88/7974 2.1 (L)  06/17/2023 4.2  09/14/2013 3.2 (L)   Phosphorus (mg/dL)  Date Value  98/88/7974 3.5   Sodium (mmol/L)  Date Value  08/16/2023 138  06/17/2023 138  09/14/2013 133 (L)   Assessment: 72 YOF w/ PMH of CHF , chronic pain syndrome with intrathecal pump, breast cancer hx, COPD, DM, recurrent exacerbation of COPD now coming in with shock due to sepsis with pneumonia and bilateral PE. Pharmacy is asked to follow and replace electrolytes while in CCU  Diuretics: 40 mg IV furosemide  x 1 01/10 Fluids: free water  30 ml q4H.   Goal of Therapy:  Electrolytes WNL  Plan:  --K 3.3, Kcl 10 mEq IV q1h x 2 doses --Follow-up electrolytes with AM labs tomorrow  Marolyn KATHEE Mare 08/16/2023 7:11 AM

## 2023-08-16 NOTE — Progress Notes (Signed)
 PHARMACY - ANTICOAGULATION CONSULT NOTE  Pharmacy Consult for Heparin   Indication: atrial fibrillation and pulmonary embolus  Patient Measurements: Height: 4' 10.27 (148 cm) Weight: 72 kg (158 lb 11.7 oz) IBW/kg (Calculated) : 41.52 Heparin  Dosing Weight: 56.7 kg   Labs: Recent Labs    08/14/23 0408 08/14/23 1354 08/15/23 0433 08/15/23 1351 08/16/23 0003 08/16/23 0546 08/16/23 0933 08/16/23 2053  HGB 7.5*  --  7.5*  --   --  7.7*  --   --   HCT 24.2*  --  24.2*  --   --  24.5*  --   --   PLT 366  --  313  --   --  343  --   --   APTT 46*   < > 103*   < > 106*  --  127* 77*  HEPARINUNFRC 0.49  --  0.99*  --  0.84*  --   --   --   CREATININE 0.92  --  0.85  --   --  0.69  --   --    < > = values in this interval not displayed.    Estimated Creatinine Clearance: 53.1 mL/min (by C-G formula based on SCr of 0.69 mg/dL).  Medical History: Past Medical History:  Diagnosis Date   Arthritis    Asthma    Atrial fibrillation (HCC)    Breast cancer (HCC) 1998   right breast ca with mastectomy and chemotherapy and radiation   Bronchitis    CHF (congestive heart failure) (HCC)    with Morphine  withdrawal   COPD (chronic obstructive pulmonary disease) (HCC)    Diabetes mellitus without complication (HCC)    Diverticulitis    diverticulosis also   Dyspnea    Endometriosis    GERD (gastroesophageal reflux disease)    History of shingles 2000-2005   Hypercholesteremia    Hypertension    IBS (irritable bowel syndrome)    Low back pain    a. Implanted morphine /bupivicaine/clonidine  pump.   Neuropathy    Orthopnea    Oxygen  dependent    uses at night   Personal history of chemotherapy    Personal history of radiation therapy    Pneumonia    pneumonia 5-6 times, history of bronchitis also   Scoliosis    Sleep apnea    does not use cpap   Stroke (HCC) 2010   TIA, 10 years ago   Withdrawal from sedative drug (HCC)    withdrawal from morphine  when pump batteries died    Assessment: Pharmacy consulted to dose heparin  in this 73 year old female admitted with respiratory failure, possible PE.   Pt was on Eliquis  5 mg PO BID PTA for Afib.   Unsure of last dose.  1/5 1741 aPTT 55 1/6 0304 aPTT 194,  HL >1.10 1/6 1556 aPTT 59  Subtherapeutic 1/7 0150 aPTT 93, therapeutic x 1 1/7 1949 aPTT 82, therapeutic x 2 1/8 0407 aPTT 73, therapeutic x 3 / HL 0.46 / Hgb 9.8 > 8.1 1/9 2051 aPTT 70, therapeutic 1/10 0408 aPTT 46, subtherapeutic / HL 0.49 not correlating 1/10 2138 aPTT 69, therapeutic x 2 1/11 0433 aPTT 103, slightly supratherapeutic / HL 0.99 1/11 1351 aPTT 64, SUBtherapeutic  1/12 0003 aPTT 106, supratherapeutic 1/12 0933 aPTT 127, supratherapeutic 1/12 2053 aPTT 77, therapeutic x 1  Goal of Therapy:  aPTT : 66 - 102  Heparin  level 0.3-0.7 units/ml Monitor platelets by anticoagulation protocol: Yes   Plan:   Continue heparin  infusion  at a rate of 1350 units/hr Check confirmatory aPTT in 8 hrs  Recheck heparin  level once daily while on heparin  (we will continue to use aPTT to guide therapy until aPTT and heparin  level correlate) Monitor CBC and signs/symptoms of bleeding, Hgb trending down.  Thank you for involving pharmacy in this patient's care.   Lum VEAR Mania, PharmD 08/16/2023 9:13 PM

## 2023-08-16 NOTE — Progress Notes (Signed)
 Patient has been hollering out all night stating that she needed water , she could not sleep and that she was hot. Patient denies pain. Patient has only slept just under 2 hours. Daughter at bedside. Precedex  was titrated based on vital signs. Mouth moisture applied, fan provided and made sure all lights were low and TV was off. Bed in the lowest position, call bell within reach side rails up X2, head of bed at 30 degrees, vital signs stable. Will continue to monitor.

## 2023-08-16 NOTE — Progress Notes (Addendum)
 PHARMACY - ANTICOAGULATION CONSULT NOTE  Pharmacy Consult for Heparin   Indication: atrial fibrillation and pulmonary embolus  Patient Measurements: Height: 4' 10.27 (148 cm) Weight: 72 kg (158 lb 11.7 oz) IBW/kg (Calculated) : 41.52 Heparin  Dosing Weight: 56.7 kg   Labs: Recent Labs    08/14/23 0408 08/14/23 1354 08/15/23 0433 08/15/23 1351 08/16/23 0003 08/16/23 0546 08/16/23 0933  HGB 7.5*  --  7.5*  --   --  7.7*  --   HCT 24.2*  --  24.2*  --   --  24.5*  --   PLT 366  --  313  --   --  343  --   APTT 46*   < > 103* 64* 106*  --  127*  HEPARINUNFRC 0.49  --  0.99*  --  0.84*  --   --   CREATININE 0.92  --  0.85  --   --  0.69  --    < > = values in this interval not displayed.    Estimated Creatinine Clearance: 53.1 mL/min (by C-G formula based on SCr of 0.69 mg/dL).  Medical History: Past Medical History:  Diagnosis Date   Arthritis    Asthma    Atrial fibrillation (HCC)    Breast cancer (HCC) 1998   right breast ca with mastectomy and chemotherapy and radiation   Bronchitis    CHF (congestive heart failure) (HCC)    with Morphine  withdrawal   COPD (chronic obstructive pulmonary disease) (HCC)    Diabetes mellitus without complication (HCC)    Diverticulitis    diverticulosis also   Dyspnea    Endometriosis    GERD (gastroesophageal reflux disease)    History of shingles 2000-2005   Hypercholesteremia    Hypertension    IBS (irritable bowel syndrome)    Low back pain    a. Implanted morphine /bupivicaine/clonidine  pump.   Neuropathy    Orthopnea    Oxygen  dependent    uses at night   Personal history of chemotherapy    Personal history of radiation therapy    Pneumonia    pneumonia 5-6 times, history of bronchitis also   Scoliosis    Sleep apnea    does not use cpap   Stroke (HCC) 2010   TIA, 10 years ago   Withdrawal from sedative drug (HCC)    withdrawal from morphine  when pump batteries died   Assessment: Pharmacy consulted to dose  heparin  in this 72 year old female admitted with respiratory failure, possible PE.   Pt was on Eliquis  5 mg PO BID PTA for Afib.   Unsure of last dose.  1/5 1741 aPTT 55 1/6 0304 aPTT 194,  HL >1.10 1/6 1556 aPTT 59  Subtherapeutic 1/7 0150 aPTT 93, therapeutic x 1 1/7 1949 aPTT 82, therapeutic x 2 1/8 0407 aPTT 73, therapeutic x 3 / HL 0.46 / Hgb 9.8 > 8.1 1/9 2051 aPTT 70, therapeutic 1/10 0408 aPTT 46, subtherapeutic / HL 0.49 not correlating 1/10 2138 aPTT 69, therapeutic x 2 1/11 0433 aPTT 103, slightly supratherapeutic / HL 0.99 1/11 1351 aPTT 64, SUBtherapeutic  1/12 0003 aPTT 106, supratherapeutic 1/12 0933 aPTT 127, supratherapeutic  Goal of Therapy:  aPTT : 66 - 102  Heparin  level 0.3-0.7 units/ml Monitor platelets by anticoagulation protocol: Yes   Plan:   Decrease heparin  infusion to 1350 units/hr Recheck aPTT in 8 hrs after rate change Recheck heparin  level once daily while on heparin  (we will continue to use aPTT to guide therapy  until aPTT and heparin  level correlate) Monitor CBC and signs/symptoms of bleeding, Hgb trending down.  Thank you for involving pharmacy in this patient's care.   Marolyn KATHEE Mare 08/16/2023 10:33 AM

## 2023-08-16 NOTE — Evaluation (Signed)
 Clinical/Bedside Swallow Evaluation Patient Details  Name: Jordan Chan MRN: [id number] Date of Birth: 1951/05/19  Today's Date: 08/16/2023 Time: SLP Start Time (ACUTE ONLY): 0915 SLP Stop Time (ACUTE ONLY): 1015 SLP Time Calculation (min) (ACUTE ONLY): 60 min  Past Medical History:  Past Medical History:  Diagnosis Date   Arthritis    Asthma    Atrial fibrillation (HCC)    Breast cancer (HCC) 1998   right breast ca with mastectomy and chemotherapy and radiation   Bronchitis    CHF (congestive heart failure) (HCC)    with Morphine  withdrawal   COPD (chronic obstructive pulmonary disease) (HCC)    Diabetes mellitus without complication (HCC)    Diverticulitis    diverticulosis also   Dyspnea    Endometriosis    GERD (gastroesophageal reflux disease)    History of shingles 2000-2005   Hypercholesteremia    Hypertension    IBS (irritable bowel syndrome)    Low back pain    a. Implanted morphine /bupivicaine/clonidine  pump.   Neuropathy    Orthopnea    Oxygen  dependent    uses at night   Personal history of chemotherapy    Personal history of radiation therapy    Pneumonia    pneumonia 5-6 times, history of bronchitis also   Scoliosis    Sleep apnea    does not use cpap   Stroke (HCC) 2010   TIA, 74 years ago   Withdrawal from sedative drug (HCC)    withdrawal from morphine  when pump batteries died   Past Surgical History:  Past Surgical History:  Procedure Laterality Date   ABDOMINAL HYSTERECTOMY  1987   BACK SURGERY     Tailbone removed following fracture   BREAST SURGERY Right    mastectomy   CARDIAC CATHETERIZATION     CATARACT EXTRACTION W/PHACO Right 05/19/2019   Procedure: CATARACT EXTRACTION PHACO AND INTRAOCULAR LENS PLACEMENT (IOC), RIGHT, DIABETIC;  Surgeon: Ferol Rogue, MD;  Location: [facility] ORS;  Service: Ophthalmology;  Laterality: Right;  Lot # [id number] H US : 00:43.8 CDE: 4.59   CATARACT EXTRACTION W/PHACO Left 06/16/2019   Procedure:  CATARACT EXTRACTION PHACO AND INTRAOCULAR LENS PLACEMENT (IOC) LEFT VISION BLUE DIABETIC;  Surgeon: Ferol Rogue, MD;  Location: [facility] ORS;  Service: Ophthalmology;  Laterality: Left;  Lot #[id number] H US : 00:46.9 CDE: 6.53   COCCYX REMOVAL     COLONOSCOPY WITH PROPOFOL  N/A 01/01/2017   Procedure: COLONOSCOPY WITH PROPOFOL ;  Surgeon: Therisa Bi, MD;  Location: [facility] ENDOSCOPY;  Service: Endoscopy;  Laterality: N/A;   COLONOSCOPY WITH PROPOFOL  N/A 12/11/2021   Procedure: COLONOSCOPY WITH PROPOFOL ;  Surgeon: Unk Corinn Skiff, MD;  Location[facility] ENDOSCOPY;  Service: Gastroenterology;  Laterality: N/A;   ELBOW ARTHROSCOPY WITH TENDON RECONSTRUCTION     ESOPHAGOGASTRODUODENOSCOPY (EGD) WITH PROPOFOL  N/A 01/01/2017   Procedure: ESOPHAGOGASTRODUODENOSCOPY (EGD) WITH PROPOFOL ;  Surgeon: Therisa Bi, MD;  Location: [facility] ENDOSCOPY;  Service: Endoscopy;  Laterality: N/A;   ESOPHAGOGASTRODUODENOSCOPY (EGD) WITH PROPOFOL  N/A 12/11/2021   Procedure: ESOPHAGOGASTRODUODENOSCOPY (EGD) WITH PROPOFOL ;  Surgeon: Unk Corinn Skiff, MD;  Location: [facility] ENDOSCOPY;  Service: Gastroenterology;  Laterality: N/A;   INTRATHECAL PUMP IMPLANT     LEFT HEART CATH AND CORONARY ANGIOGRAPHY N/A 04/28/2017   Procedure: LEFT HEART CATH AND CORONARY ANGIOGRAPHY;  Surgeon: Mady Bruckner, MD;  Location: [facility] INVASIVE CV LAB;  Service: Cardiovascular;  Laterality: N/A;   MASTECTOMY Right 06/1997   morphine  pump  2011   PLANTAR FASCIA RELEASE     TOTAL HIP ARTHROPLASTY Left    TRIGGER  FINGER RELEASE     HPI:  Pt is a 73 y/o F admitted on 08/09/23 after presenting with c/o respiratory distress, O2 sats in the 50s-60s on room air. Pt admitted to ICU for tx of acute hypoxic hypercapnic respiratory failure 2/2 suspected AECOPD, pna.  Pt intubated at admit and successfully extubated on 08/15/23. Pt also found to have acute PE during admission. PMH: B12 deficiency, chronic pain syndrome on intrathecal pump, opiod use, obesity,  paroxysmal a-fib, stress-induced cardiomyopathy, PE, DM, COPD on nocturnal O2, GERD, OSA, IBS, CVA.  No reported difficulty swallowing at home; wears Dentures.   Imaging: Lungs/Pleura: Extensive multifocal airspace consolidation noted  throughout the lungs bilaterally, predominantly in the dependent  portion of the lungs   CXR on 07/23/2023: No active cardiopulmonary disease.    Assessment / Plan / Recommendation  Clinical Impression   Pt seen for BSE. Pt awake, verbal. Sitting in chair. Family present. Pt perseverated on wanting to get back in bed. Easily SOB w/ any exertion(HFNC+). Pt needed encouragement for oral intake, though stated I want some water .  On HFNC O2 support; 35%. Afebrile. WBC mildly elevated.  Pt appears to present w/ grossly functional oropharyngeal phase swallow w/ No oropharyngeal phase dysphagia noted, No neuromuscular deficits noted. Pt does wear Dentures(full) and does not have them present for eating solids this session. Pt also has declined Pulmonary status - rest breaks given w/ oral intake. She appears weak. Pt consumed po trials w/ No overt, clinical s/s of aspiration during po trials -- though MOD Belching noted.  Pt appears at reduced risk for aspiration when following aspiration precautions. However, pt does have challenging factors that could impact her oropharyngeal swallowing to include Pain/discomfort(chronic and NSG aware), deconditioning/weakness, need for support w/ feeding d/t weakness, need for Rest Breaks during oral intake d/t WOB/SOB w/ ANY Exertion, and distracted easily. These factors can increase risk for aspiration, dysphagia as well as decreased oral intake overall.  During po trials, pt consumed consistencies given w/ no overt coughing, decline in vocal quality, or change in respiratory presentation during/post trials. O2 sats remained in the upper 90s during trials. Rest Breaks given to monitor RR and any SOB/WOB w/ the exertion of tasks. Pt  encouraged to focus on tasks, self-feed by Holding Cup to increase safety of swallowing/intake. Pt was easily distracted and talking at times.  Oral phase appeared [facility] w/ timely bolus management and control of bolus propulsion for A-P transfer for swallowing. Oral clearing achieved w/ all trial consistencies -- moistened foods given. No solids given d/t not having Dentures in place -- husband bringing adhesive for Dentures today.  OM Exam appeared [facility] w/ no unilateral weakness noted. Speech Clear. Pt fed self somewhat w/ setup support. Weak UEs.  Recommend a Pureed consistency diet w/ well-moistened foods initially -- trials to upgrade w/ Dentures in place and as Pulmonary status improves for the work/exertion; Thin liquids -- carefully monitor straw use, and pt should help to Hold Cup when drinking. Recommend general aspiration precautions, reduce distractions at meals -- no talking when eating/drinking. Rest Breaks during meals. Conservation of energy practices. Sitting in chair best for meals. Pills WHOLE vs Crushed in Puree for safer, easier swallowing -- encouraged now and for D/C to the Dtr.  Education given on Pills in Puree; food consistencies and easy to eat options; general aspiration precautions to pt and Dtr/Family. NSG updated, agreed. MD updated.  ST Services will f/u w/ toleration of diet and trials to upgrade consistency as  able. Recommend Dietician f/u for support. SLP Visit Diagnosis: Dysphagia, unspecified (R13.10) (in setting of declined Pulmonary status and lacking Dentures currently; full assistance required for feeding d/t weakness of recent intubation/extubation)    Aspiration Risk  Mild aspiration risk;Risk for inadequate nutrition/hydration (reduced following general precs.)    Diet Recommendation   Thin;Dysphagia 1 (puree) (initially) = a Pureed consistency diet w/ well-moistened foods initially -- trials to upgrade w/ Dentures in place and as Pulmonary status improves for the  work/exertion; Thin liquids -- carefully monitor straw use, and pt should help to Hold Cup when drinking. Recommend general aspiration precautions, reduce distractions at meals -- no talking when eating/drinking. Rest Breaks during meals. Conservation of energy practices. Sitting in chair best for meals.   Medication Administration: Whole meds with puree (vs need to Crush)    Other  Recommendations Recommended Consults:  (Dietician f/u) Oral Care Recommendations: Oral care BID;Oral care before and after PO;Staff/trained caregiver to provide oral care (Denture care)    Recommendations for follow up therapy are one component of a multi-disciplinary discharge planning process, led by the attending physician.  Recommendations may be updated based on patient status, additional functional criteria and insurance authorization.  Follow up Recommendations No SLP follow up (expected at d/c)      Assistance Recommended at Discharge  Intermittent-Full  Functional Status Assessment Patient has had a recent decline in their functional status and demonstrates the ability to make significant improvements in function in a reasonable and predictable amount of time.  Frequency and Duration min 2x/week  2 weeks       Prognosis Prognosis for improved oropharyngeal function: Fair (-Good) Barriers to Reach Goals: Time post onset;Severity of deficits Barriers/Prognosis Comment: Declined Pulmonary status and lacking Dentures currently; full assistance required for feeding d/t weakness of recent intubation/extubation      Swallow Study   General Date of Onset: 08/09/23 HPI: Pt is a 73 y/o F admitted on 08/09/23 after presenting with c/o respiratory distress, O2 sats in the 50s-60s on room air. Pt admitted to ICU for tx of acute hypoxic hypercapnic respiratory failure 2/2 suspected AECOPD, pna.  Pt intubated at admit and successfully extubated on 08/15/23. Pt also found to have acute PE during admission. PMH: B12  deficiency, chronic pain syndrome on intrathecal pump, opiod use, obesity, paroxysmal a-fib, stress-induced cardiomyopathy, PE, DM, COPD on nocturnal O2, GERD, OSA, IBS, CVA.  No reported difficulty swallowing at home; wears Dentures.   Imaging: Lungs/Pleura: Extensive multifocal airspace consolidation noted  throughout the lungs bilaterally, predominantly in the dependent  portion of the lungs   CXR on 07/23/2023: No active cardiopulmonary disease. Type of Study: Bedside Swallow Evaluation Previous Swallow Assessment: none Diet Prior to this Study: NPO Temperature Spikes Noted: No (wbc 12.9) Respiratory Status: Nasal cannula;Increased respiratory rate (HFNC 35%) History of Recent Intubation: Yes Total duration of intubation (days): 6 days Date extubated: 08/15/23 Behavior/Cognition: Alert;Cooperative;Pleasant mood;Distractible;Requires cueing Oral Cavity Assessment: Within Functional Limits Oral Care Completed by SLP: Yes Oral Cavity - Dentition: Edentulous (has Dentures - uses adhesive husband is bringing in later) Vision: Functional for self-feeding Self-Feeding Abilities: Able to feed self;Needs assist;Needs set up;Total assist (weak UEs) Patient Positioning: Upright in chair Baseline Vocal Quality: Normal Volitional Cough: Strong Volitional Swallow: Able to elicit    Oral/Motor/Sensory Function Overall Oral Motor/Sensory Function: Within functional limits   Ice Chips Ice chips: Within functional limits Presentation: Spoon (fed; 5 trials)   Thin Liquid Thin Liquid: Within functional limits Presentation: Cup;Self Fed;Straw (10  trials) Other Comments: took small sips    Nectar Thick Nectar Thick Liquid: Not tested   Honey Thick Honey Thick Liquid: Not tested   Puree Puree: Within functional limits Presentation: Spoon (fed; 10 trials)   Solid     Solid: Not tested Other Comments: no dentures in place yet        Comer Portugal, MS, CCC-SLP Speech Language  Pathologist Rehab Services; [facility] [place] [phone] (ascom) Mercedes Fort 08/16/2023,1:30 PM

## 2023-08-16 NOTE — Evaluation (Signed)
 Occupational Therapy Evaluation Patient Details Name: Jordan Chan MRN: [id number] DOB: [birth year] Today's Date: 08/16/2023   History of Present Illness Pt is a 73 y/o F admitted on 08/09/23 after presenting with c/o respiratory distress, O2 sats in the 50s-60s on room air. Pt admitted to ICU for tx of acute hypoxic hypercapnic respiratory failure 2/2 suspected PNA & AECOPD, pt intubated but successfully extubated on 08/15/23. Pt also found to have acute PE during admission. PMH: B12 deficiency, chronic pain syndrome s/p intrathecal pump, paroxysmal a-fib, stress-induced cardiomyopathy, PE, DM, COPD on nocturnal O2, GERD, OSA, IBS, CVA   Clinical Impression   Jordan Chan was seen for OT evaluation this date. Prior to hospital admission, pt was IND. Pt lives with son. Pt presents to acute OT demonstrating impaired ADL performance and functional mobility 2/2 decreased activity tolerance and functional strength/balance deficits. Pt currently requires MOD A sup>sit, poor sitting balance (no BLE support 2/2 elevated bed height). MAX A x2 for bed>chair squat pivot t/f. Pt would benefit from skilled OT to address noted impairments and functional limitations (see below for any additional details). Upon hospital discharge, recommend OT follow up >3 hours/day.     If plan is discharge home, recommend the following: A lot of help with walking and/or transfers;A lot of help with bathing/dressing/bathroom    Functional Status Assessment  Patient has had a recent decline in their functional status and demonstrates the ability to make significant improvements in function in a reasonable and predictable amount of time.  Equipment Recommendations  Other (comment) (defer to next venue of care)    Recommendations for Other Services       Precautions / Restrictions Precautions Precautions: Fall Restrictions Weight Bearing Restrictions Per Provider Order: No      Mobility Bed Mobility Overal bed  mobility: Needs Assistance Bed Mobility: Supine to Sit     Supine to sit: Mod assist, HOB elevated, Used rails          Transfers Overall transfer level: Needs assistance Equipment used: 2 person hand held assist Transfers: Bed to chair/wheelchair/BSC     Squat pivot transfers: Max assist, +2 physical assistance, +2 safety/equipment       General transfer comment: limited by elevated bed height and pt's being 4'10      Balance Overall balance assessment: Needs assistance Sitting-balance support: Feet supported Sitting balance-Leahy Scale: Poor   Postural control: Posterior lean                                 ADL either performed or assessed with clinical judgement   ADL Overall ADL's : Needs assistance/impaired                                       General ADL Comments: MAX A don B socks in sitting. MAX A x2 for simulated BSC t/f      Pertinent Vitals/Pain Pain Assessment Pain Assessment: Faces Faces Pain Scale: Hurts little more Pain Location: L groin Pain Descriptors / Indicators: Discomfort Pain Intervention(s): Limited activity within patient's tolerance, Repositioned     Extremity/Trunk Assessment Upper Extremity Assessment Upper Extremity Assessment: Generalized weakness   Lower Extremity Assessment Lower Extremity Assessment: Generalized weakness       Communication Communication Communication: No apparent difficulties   Cognition Arousal: Lethargic Behavior During Therapy: WFL for tasks assessed/performed (irritable)  Overall Cognitive Status: Within Functional Limits for tasks assessed                                 General Comments: AxOx4, irritable during session dtr reports mood changes with medications     General Comments  Pt on HHFNC throughout session            Home Living Family/patient expects to be discharged to:: Private residence Living Arrangements: Children (son is  mildly disabled, does not drive but can assist her around the house) Available Help at Discharge: Family;Available 24 hours/day Type of Home: Mobile home Home Access: Stairs to enter Entrance Stairs-Number of Steps: 5 Entrance Stairs-Rails: Right Home Layout: One level     Bathroom Shower/Tub: Chief Strategy Officer: Standard     Home Equipment: Teacher, English as a foreign language (2 wheels);Cane - single point          Prior Functioning/Environment Prior Level of Function : Independent/Modified Independent;Driving             Mobility Comments: Independent without AD, had THA in August but had progressed to ambulating without AD. Denies falls in the past 6 months. Still driving. ADLs Comments: Independent        OT Problem List: Decreased strength;Decreased range of motion;Decreased activity tolerance;Impaired balance (sitting and/or standing)      OT Treatment/Interventions: Self-care/ADL training;Therapeutic exercise;Energy conservation;DME and/or AE instruction;Therapeutic activities;Patient/family education;Balance training    OT Goals(Current goals can be found in the care plan section) Acute Rehab OT Goals Patient Stated Goal: to go home OT Goal Formulation: With patient/family Time For Goal Achievement: 08/30/23 Potential to Achieve Goals: Good ADL Goals Pt Will Perform Grooming: with set-up;with supervision;sitting (tolerate 10 mins) Pt Will Perform Lower Body Dressing: with min assist;sit to/from stand Pt Will Transfer to Toilet: with min assist;ambulating;bedside commode  OT Frequency: Min 1X/week    Co-evaluation PT/OT/SLP Co-Evaluation/Treatment: Yes Reason for Co-Treatment: For patient/therapist safety;Necessary to address cognition/behavior during functional activity PT goals addressed during session: Mobility/safety with mobility;Balance OT goals addressed during session: ADL's and self-care      AM-PAC OT 6 Clicks Daily Activity     Outcome  Measure Help from another person eating meals?: A Little Help from another person taking care of personal grooming?: A Lot Help from another person toileting, which includes using toliet, bedpan, or urinal?: A Lot Help from another person bathing (including washing, rinsing, drying)?: A Lot Help from another person to put on and taking off regular upper body clothing?: A Little Help from another person to put on and taking off regular lower body clothing?: A Lot 6 Click Score: 14   End of Session Nurse Communication: Mobility status  Activity Tolerance: Patient tolerated treatment well Patient left: in chair;with call bell/phone within reach;with nursing/sitter in room;with family/visitor present  OT Visit Diagnosis: Unsteadiness on feet (R26.81);Other abnormalities of gait and mobility (R26.89)                Time: 9172-9149 OT Time Calculation (min): 23 min Charges:  OT General Charges $OT Visit: 1 Visit OT Evaluation $OT Eval Moderate Complexity: 1 Mod  Elston Slot, M.S. OTR/L  08/16/23, 10:07 AM  ascom [phone]

## 2023-08-16 NOTE — Evaluation (Signed)
 Physical Therapy Evaluation Patient Details Name: Jordan Chan MRN: [id number] DOB: 02/03/1951 Today's Date: 08/16/2023  History of Present Illness  Pt is a 73 y/o F admitted on 08/09/23 after presenting with c/o respiratory distress, O2 sats in the 50s-60s on room air. Pt admitted to ICU for tx of acute hypoxic hypercapnic respiratory failure 2/2 suspected PNA & AECOPD, pt intubated but successfully extubated on 08/15/23. Pt also found to have acute PE during admission. PMH: B12 deficiency, chronic pain syndrome s/p intrathecal pump, paroxysmal a-fib, stress-induced cardiomyopathy, PE, DM, COPD on nocturnal O2, GERD, OSA, IBS, CVA  Clinical Impression  Pt seen for PT evaluation with pt agreeable with daughter present. Prior to admission pt was independent without AD, had THA in August but was ambulatory without AD, denies falls. On this date, pt requires +2 assistance for supine>sit, max assist +2 for squat pivot bed>recliner. Pt does tolerate sitting EOB with min<>mod assist. Pt would benefit from ongoing acute PT services to maximize independence with mobility & facilitate return to independent PLOF.        If plan is discharge home, recommend the following: Two people to help with walking and/or transfers;Two people to help with bathing/dressing/bathroom;Help with stairs or ramp for entrance;Assist for transportation;Direct supervision/assist for financial management;Assistance with feeding;Assistance with cooking/housework   Can travel by private vehicle        Equipment Recommendations Other (comment) (defer to next venue)  Recommendations for Other Services  Rehab consult    Functional Status Assessment Patient has had a recent decline in their functional status and demonstrates the ability to make significant improvements in function in a reasonable and predictable amount of time.     Precautions / Restrictions Precautions Precautions: Fall Restrictions Weight Bearing  Restrictions Per Provider Order: No      Mobility  Bed Mobility Overal bed mobility: Needs Assistance Bed Mobility: Supine to Sit     Supine to sit: Mod assist, HOB elevated, Used rails     General bed mobility comments: pt does participate in moving BLE towards EOB, cuing to use bed rails & pt does attempt to pull herself upright    Transfers Overall transfer level: Needs assistance Equipment used: 2 person hand held assist Transfers: Bed to chair/wheelchair/BSC       Squat pivot transfers: Max assist, +2 physical assistance, +2 safety/equipment     General transfer comment: Pt attempts STS from EOB but difficulty clearing buttocks (pt also limited by short stature & elevated EOB even at lowest setting). Pt requires max assist +2 to safely transfer bed>recliner.    Ambulation/Gait                  Stairs            Wheelchair Mobility     Tilt Bed    Modified Rankin (Stroke Patients Only)       Balance Overall balance assessment: Needs assistance Sitting-balance support: Feet supported Sitting balance-Leahy Scale: Poor   Postural control: Posterior lean                                   Pertinent Vitals/Pain Pain Assessment Pain Assessment: Faces Faces Pain Scale: Hurts little more Pain Location: L groin Pain Descriptors / Indicators: Discomfort Pain Intervention(s): Monitored during session, Repositioned, Limited activity within patient's tolerance    Home Living Family/patient expects to be discharged to:: Private residence Living Arrangements: Children (son is  mildly disabled, does not drive but can assist her around the house) Available Help at Discharge: Family;Available 24 hours/day Type of Home: Mobile home Home Access: Stairs to enter Entrance Stairs-Rails: Right Entrance Stairs-Number of Steps: 5   Home Layout: One level Home Equipment: BSC/3in1;Rolling Walker (2 wheels);Cane - single point      Prior  Function Prior Level of Function : Independent/Modified Independent;Driving             Mobility Comments: Independent without AD, had THA in August but had progressed to ambulating without AD. Denies falls in the past 6 months. Still driving. ADLs Comments: Independent     Extremity/Trunk Assessment   Upper Extremity Assessment Upper Extremity Assessment: Generalized weakness    Lower Extremity Assessment Lower Extremity Assessment: Generalized weakness       Communication   Communication Communication: No apparent difficulties  Cognition Arousal: Lethargic Behavior During Therapy:  (irritable) Overall Cognitive Status: Within Functional Limits for tasks assessed                                 General Comments: AxOx4, irritable during session        General Comments General comments (skin integrity, edema, etc.): Pt on HHFNC throughout session    Exercises     Assessment/Plan    PT Assessment Patient needs continued PT services  PT Problem List Decreased strength;Cardiopulmonary status limiting activity;Decreased balance;Decreased mobility;Decreased knowledge of precautions;Decreased safety awareness;Decreased knowledge of use of DME;Decreased activity tolerance       PT Treatment Interventions DME instruction;Balance training;Modalities;Neuromuscular re-education;Gait training;Stair training;Functional mobility training;Patient/family education;Therapeutic activities;Therapeutic exercise;Manual techniques    PT Goals (Current goals can be found in the Care Plan section)  Acute Rehab PT Goals Patient Stated Goal: get better PT Goal Formulation: With patient/family Time For Goal Achievement: 08/30/23 Potential to Achieve Goals: Good    Frequency Min 1X/week     Co-evaluation PT/OT/SLP Co-Evaluation/Treatment: Yes Reason for Co-Treatment: For patient/therapist safety;Necessary to address cognition/behavior during functional activity PT  goals addressed during session: Mobility/safety with mobility;Balance         AM-PAC PT 6 Clicks Mobility  Outcome Measure Help needed turning from your back to your side while in a flat bed without using bedrails?: A Lot Help needed moving from lying on your back to sitting on the side of a flat bed without using bedrails?: Total Help needed moving to and from a bed to a chair (including a wheelchair)?: Total Help needed standing up from a chair using your arms (e.g., wheelchair or bedside chair)?: Total Help needed to walk in hospital room?: Total Help needed climbing 3-5 steps with a railing? : Total 6 Click Score: 7    End of Session Equipment Utilized During Treatment: Oxygen  Activity Tolerance: Patient tolerated treatment well;Patient limited by fatigue Patient left: in chair;with family/visitor present Nurse Communication: Mobility status PT Visit Diagnosis: Unsteadiness on feet (R26.81);Muscle weakness (generalized) (M62.81);Other abnormalities of gait and mobility (R26.89);Difficulty in walking, not elsewhere classified (R26.2)    Time: 0810-0850 PT Time Calculation (min) (ACUTE ONLY): 40 min   Charges:   PT Evaluation $PT Eval High Complexity: 1 High   PT General Charges $$ ACUTE PT VISIT: 1 Visit         Richerd Pinal, PT, DPT 08/16/23, 9:18 AM   Richerd CHRISTELLA Pinal 08/16/2023, 9:17 AM

## 2023-08-17 DIAGNOSIS — J9622 Acute and chronic respiratory failure with hypercapnia: Secondary | ICD-10-CM

## 2023-08-17 DIAGNOSIS — J9621 Acute and chronic respiratory failure with hypoxia: Secondary | ICD-10-CM | POA: Diagnosis not present

## 2023-08-17 LAB — BASIC METABOLIC PANEL
Anion gap: 11 (ref 5–15)
BUN: 23 mg/dL (ref 8–23)
CO2: 30 mmol/L (ref 22–32)
Calcium: 8.6 mg/dL — ABNORMAL LOW (ref 8.9–10.3)
Chloride: 100 mmol/L (ref 98–111)
Creatinine, Ser: 0.85 mg/dL (ref 0.44–1.00)
GFR, Estimated: >60 mL/min (ref >60)
Glucose, Bld: 113 mg/dL — ABNORMAL HIGH (ref 70–99)
Potassium: 4 mmol/L (ref 3.5–5.1)
Sodium: 141 mmol/L (ref 135–145)

## 2023-08-17 LAB — CBC
HCT: 27.4 % — ABNORMAL LOW (ref 36.0–46.0)
Hemoglobin: 8.7 g/dL — ABNORMAL LOW (ref 12.0–15.0)
MCH: 28.5 pg (ref 26.0–34.0)
MCHC: 31.8 g/dL (ref 30.0–36.0)
MCV: 89.8 fL (ref 80.0–100.0)
Platelets: 316 10*3/uL (ref 150–400)
RBC: 3.05 MIL/uL — ABNORMAL LOW (ref 3.87–5.11)
RDW: 16.5 % — ABNORMAL HIGH (ref 11.5–15.5)
WBC: 14.6 10*3/uL — ABNORMAL HIGH (ref 4.0–10.5)
nRBC: 0.1 % (ref 0.0–0.2)

## 2023-08-17 LAB — GLUCOSE, CAPILLARY
Glucose-Capillary: 104 mg/dL — ABNORMAL HIGH (ref 70–99)
Glucose-Capillary: 113 mg/dL — ABNORMAL HIGH (ref 70–99)
Glucose-Capillary: 115 mg/dL — ABNORMAL HIGH (ref 70–99)
Glucose-Capillary: 140 mg/dL — ABNORMAL HIGH (ref 70–99)
Glucose-Capillary: 91 mg/dL (ref 70–99)
Glucose-Capillary: 94 mg/dL (ref 70–99)

## 2023-08-17 LAB — APTT: aPTT: 69 s — ABNORMAL HIGH (ref 24–36)

## 2023-08-17 LAB — HEPARIN LEVEL (UNFRACTIONATED): Heparin Unfractionated: 0.63 [IU]/mL (ref 0.30–0.70)

## 2023-08-17 MED ORDER — BUSPIRONE HCL 10 MG PO TABS
5.0000 mg | ORAL_TABLET | Freq: Two times a day (BID) | ORAL | Status: DC
  Administered 2023-08-17 – 2023-08-22 (×10): 5 mg via ORAL
  Filled 2023-08-17 (×10): qty 1

## 2023-08-17 MED ORDER — VENLAFAXINE HCL ER 75 MG PO CP24
150.0000 mg | ORAL_CAPSULE | Freq: Every day | ORAL | Status: DC
  Administered 2023-08-18 – 2023-08-22 (×5): 150 mg via ORAL
  Filled 2023-08-17 (×5): qty 2

## 2023-08-17 MED ORDER — ADULT MULTIVITAMIN W/MINERALS CH
1.0000 | ORAL_TABLET | Freq: Every day | ORAL | Status: DC
  Administered 2023-08-18 – 2023-08-22 (×5): 1 via ORAL
  Filled 2023-08-17 (×5): qty 1

## 2023-08-17 MED ORDER — INSULIN ASPART 100 UNIT/ML IJ SOLN
0.0000 [IU] | Freq: Three times a day (TID) | INTRAMUSCULAR | Status: DC
  Administered 2023-08-17 – 2023-08-19 (×5): 3 [IU] via SUBCUTANEOUS
  Administered 2023-08-19: 7 [IU] via SUBCUTANEOUS
  Administered 2023-08-20 (×2): 3 [IU] via SUBCUTANEOUS
  Administered 2023-08-20: 4 [IU] via SUBCUTANEOUS
  Administered 2023-08-21: 3 [IU] via SUBCUTANEOUS
  Administered 2023-08-21: 11 [IU] via SUBCUTANEOUS
  Administered 2023-08-22: 4 [IU] via SUBCUTANEOUS
  Administered 2023-08-22: 3 [IU] via SUBCUTANEOUS
  Filled 2023-08-17 (×14): qty 1

## 2023-08-17 MED ORDER — INSULIN GLARGINE-YFGN 100 UNIT/ML ~~LOC~~ SOLN
10.0000 [IU] | Freq: Every day | SUBCUTANEOUS | Status: DC
  Administered 2023-08-17 – 2023-08-19 (×3): 10 [IU] via SUBCUTANEOUS
  Filled 2023-08-17 (×3): qty 0.1

## 2023-08-17 MED ORDER — ENSURE ENLIVE PO LIQD
237.0000 mL | Freq: Three times a day (TID) | ORAL | Status: DC
  Administered 2023-08-17 – 2023-08-22 (×8): 237 mL via ORAL

## 2023-08-17 NOTE — Progress Notes (Signed)
 Physical Therapy Treatment Patient Details Name: Jordan Chan MRN: [id number] DOB: 1951/04/02 Today's Date: 08/17/2023   History of Present Illness Pt is a 73 y/o F admitted on 08/09/23 after presenting with c/o respiratory distress, O2 sats in the 50s-60s on room air. Pt admitted to ICU for tx of acute hypoxic hypercapnic respiratory failure 2/2 suspected PNA & AECOPD, pt intubated but successfully extubated on 08/15/23. Pt also found to have acute PE during admission. PMH: B12 deficiency, chronic pain syndrome s/p intrathecal pump, paroxysmal a-fib, stress-induced cardiomyopathy, PE, DM, COPD on nocturnal O2, GERD, OSA, IBS, CVA    PT Comments  Pt seen for PT tx with pt agreeable, daughter & husband present for session. Pt notes fatigue but willing to participate with minimal encouragement. Pt is able to complete bed mobility with mod assist, stand pivot with max assist +2, & STS from recliner with mod assist +2. Pt with improving strength as pt able to clear buttocks during transfer attempts. Pt would benefit from ongoing skilled PT treatment to progress mobility as able.    If plan is discharge home, recommend the following: Two people to help with walking and/or transfers;Two people to help with bathing/dressing/bathroom;Help with stairs or ramp for entrance;Assist for transportation;Direct supervision/assist for financial management;Assistance with feeding;Assistance with cooking/housework   Can travel by private vehicle     No  Equipment Recommendations  Other (comment) (defer to next venue)    Recommendations for Other Services       Precautions / Restrictions Precautions Precautions: Fall Restrictions Weight Bearing Restrictions Per Provider Order: No     Mobility  Bed Mobility Overal bed mobility: Needs Assistance Bed Mobility: Supine to Sit     Supine to sit: HOB elevated, Used rails, Mod assist     General bed mobility comments: Pt initiates moving BLE to EOB,  requires assistance to completely move BLE off EOB & upright trunk.    Transfers Overall transfer level: Needs assistance Equipment used: 2 person hand held assist, Rolling walker (2 wheels) Transfers: Sit to/from Stand Sit to Stand: Mod assist, +2 physical assistance Stand pivot transfers: Max assist, +2 physical assistance, +2 safety/equipment         General transfer comment: Pt able to transfer bed>recliner via stand pivot with max assist +2 with improved ability to clear buttocks from EOB & participate in pivot to recliner. Pt transfers STS from recliner with mod assist +2 x 2 attempts with cuing re: hand placement, forward vs downward head with improvement during 2nd attempt. Pt requires cuing for anterior pelvic shift to upright posture, as well as need to activate hip extensors/glutes.    Ambulation/Gait                   Stairs             Wheelchair Mobility     Tilt Bed    Modified Rankin (Stroke Patients Only)       Balance Overall balance assessment: Needs assistance Sitting-balance support: Feet unsupported, Bilateral upper extremity supported Sitting balance-Leahy Scale: Poor                                      Cognition Arousal: Lethargic Behavior During Therapy: Flat affect Overall Cognitive Status: Within Functional Limits for tasks assessed  Exercises      General Comments        Pertinent Vitals/Pain Pain Assessment Pain Assessment: No/denies pain    Home Living                          Prior Function            PT Goals (current goals can now be found in the care plan section) Acute Rehab PT Goals Patient Stated Goal: get better PT Goal Formulation: With patient/family Time For Goal Achievement: 08/30/23 Potential to Achieve Goals: Fair Progress towards PT goals: Progressing toward goals    Frequency    Min 1X/week       PT Plan      Co-evaluation              AM-PAC PT 6 Clicks Mobility   Outcome Measure  Help needed turning from your back to your side while in a flat bed without using bedrails?: A Lot Help needed moving from lying on your back to sitting on the side of a flat bed without using bedrails?: Total Help needed moving to and from a bed to a chair (including a wheelchair)?: Total Help needed standing up from a chair using your arms (e.g., wheelchair or bedside chair)?: Total Help needed to walk in hospital room?: Total Help needed climbing 3-5 steps with a railing? : Total 6 Click Score: 7    End of Session Equipment Utilized During Treatment: Oxygen  Activity Tolerance: Patient tolerated treatment well;Patient limited by fatigue Patient left: in chair;with family/visitor present Nurse Communication: Mobility status PT Visit Diagnosis: Unsteadiness on feet (R26.81);Muscle weakness (generalized) (M62.81);Other abnormalities of gait and mobility (R26.89);Difficulty in walking, not elsewhere classified (R26.2)     Time: 8895-8878 PT Time Calculation (min) (ACUTE ONLY): 17 min  Charges:    $Therapeutic Activity: 8-22 mins PT General Charges $$ ACUTE PT VISIT: 1 Visit                     Richerd Pinal, PT, DPT 08/17/23, 11:29 AM   Richerd CHRISTELLA Pinal 08/17/2023, 11:28 AM

## 2023-08-17 NOTE — Progress Notes (Signed)
 Nutrition Follow-up  DOCUMENTATION CODES:   Not applicable  INTERVENTION:   Ensure Enlive po TID, each supplement provides 350 kcal and 20 grams of protein.  MVI po daily   NUTRITION DIAGNOSIS:   Inadequate oral intake related to inability to eat as evidenced by NPO status. -resolving   GOAL:   Patient will meet greater than or equal to 90% of their needs -previously met with tube feeds   MONITOR:   PO intake, Supplement acceptance, Labs, Weight trends, I & O's, Skin  ASSESSMENT:   73 y/o female with h/o IBS, OSA, DDD, chronic pain with opioid dependence s/p intrathecal pump, B12 deficiency, CHF, Afib, breast cancer (s/p mastectomy/chemoradiation 1998), GERD, COPD, MDD, DM, CKD III, HLD, HTN, NSTEMI, PE and stroke who is admitted with CAP, small bilateral subsegmental PE's, shock and AKI.  Pt extubated 1/11. Pt seen by SLP today and was upgraded to a dysphagia 2 diet. Pt documented to be eating 25% of her meals with assistance. RD will add supplements to help pt meet her estimated needs. Pt remains at refeed risk. No BM since admission; MD and RN aware. Per chart, pt is up ~9lbs since admission. Pt +2.1L on her I & Os.   Medications reviewed and include: solu-cortef , insulin , protonix , azithromycin , ceftriaxone , heparin , levophed , propofol    Labs reviewed: K 4.0 wnl P 3.5 wnl, Mg 1.9 wnl- 1/11 Wbc- 14.6(H), Hgb 8.7(L), Hct 27.4(L) Cbgs- 140, 91, 94, 104 x 24 hrs   Diet Order:   Diet Order             DIET DYS 2 Room service appropriate? Yes; Fluid consistency: Thin  Diet effective now                  EDUCATION NEEDS:   Not appropriate for education at this time  Skin:  Skin Assessment: Reviewed RN Assessment  Last BM:  pta  Height:   Ht Readings from Last 1 Encounters:  08/09/23 4' 10.27 (1.48 m)    Weight:   Wt Readings from Last 1 Encounters:  08/16/23 72 kg    Ideal Body Weight:  43.9 kg  BMI:  Body mass index is 32.87 kg/m.  Estimated  Nutritional Needs:   Kcal:  1600-1800kcal/day  Protein:  80-90g/day  Fluid:  1.4-1.6L/day  Augustin Shams MS, RD, LDN If unable to be reached, please send secure chat to RD inpatient available from 8:00a-4:00p daily

## 2023-08-17 NOTE — Progress Notes (Signed)
 PHARMACY - ANTICOAGULATION CONSULT NOTE  Pharmacy Consult for Heparin   Indication: atrial fibrillation and pulmonary embolus  Patient Measurements: Height: 4' 10.27 (148 cm) Weight: 72 kg (158 lb 11.7 oz) IBW/kg (Calculated) : 41.52 Heparin  Dosing Weight: 56.7 kg   Labs: Recent Labs    08/15/23 0433 08/15/23 1351 08/16/23 0003 08/16/23 0546 08/16/23 0933 08/16/23 2053 08/17/23 0424  HGB 7.5*  --   --  7.7*  --   --  8.7*  HCT 24.2*  --   --  24.5*  --   --  27.4*  PLT 313  --   --  343  --   --  316  APTT 103*   < > 106*  --  127* 77* 69*  HEPARINUNFRC 0.99*  --  0.84*  --   --   --  0.63  CREATININE 0.85  --   --  0.69  --   --  0.85   < > = values in this interval not displayed.    Estimated Creatinine Clearance: 50 mL/min (by C-G formula based on SCr of 0.85 mg/dL).  Medical History: Past Medical History:  Diagnosis Date   Arthritis    Asthma    Atrial fibrillation (HCC)    Breast cancer (HCC) 1998   right breast ca with mastectomy and chemotherapy and radiation   Bronchitis    CHF (congestive heart failure) (HCC)    with Morphine  withdrawal   COPD (chronic obstructive pulmonary disease) (HCC)    Diabetes mellitus without complication (HCC)    Diverticulitis    diverticulosis also   Dyspnea    Endometriosis    GERD (gastroesophageal reflux disease)    History of shingles 2000-2005   Hypercholesteremia    Hypertension    IBS (irritable bowel syndrome)    Low back pain    a. Implanted morphine /bupivicaine/clonidine  pump.   Neuropathy    Orthopnea    Oxygen  dependent    uses at night   Personal history of chemotherapy    Personal history of radiation therapy    Pneumonia    pneumonia 5-6 times, history of bronchitis also   Scoliosis    Sleep apnea    does not use cpap   Stroke (HCC) 2010   TIA, 10 years ago   Withdrawal from sedative drug (HCC)    withdrawal from morphine  when pump batteries died   Assessment: Pharmacy consulted to dose  heparin  in this 73 year old female admitted with respiratory failure, possible PE.   Pt was on Eliquis  5 mg PO BID PTA for Afib.   Unsure of last dose.  1/5 1741 aPTT 55 1/6 0304 aPTT 194,  HL >1.10 1/6 1556 aPTT 59  Subtherapeutic 1/7 0150 aPTT 93, therapeutic x 1 1/7 1949 aPTT 82, therapeutic x 2 1/8 0407 aPTT 73, therapeutic x 3 / HL 0.46 / Hgb 9.8 > 8.1 1/9 2051 aPTT 70, therapeutic 1/10 0408 aPTT 46, subtherapeutic / HL 0.49 not correlating 1/10 2138 aPTT 69, therapeutic x 2 1/11 0433 aPTT 103, slightly supratherapeutic / HL 0.99 1/11 1351 aPTT 64, SUBtherapeutic  1/12 0003 aPTT 106, supratherapeutic 1/12 0933 aPTT 127, supratherapeutic 1/12 2053 aPTT 77, therapeutic x 1 1/13 0424 aPTT 69, therapeutic x 2 / HL 0.63  Goal of Therapy:  aPTT : 66 - 102  Heparin  level 0.3-0.7 units/ml Monitor platelets by anticoagulation protocol: Yes   Plan:   Continue heparin  infusion at a rate of 1350 units/hr Recheck aPTT daily w/ AM  labs while therapeutic Recheck heparin  level once daily while on heparin  (we will continue to use aPTT to guide therapy until aPTT and heparin  level correlate) Monitor CBC and signs/symptoms of bleeding, Hgb trending down.  Thank you for involving pharmacy in this patient's care.   Rankin CANDIE Dills, PharmD, [facility] 08/17/2023 4:55 AM

## 2023-08-17 NOTE — Progress Notes (Signed)
 PROGRESS NOTE    Jordan Chan  FMW:[id number] DOB: 1951/05/23 DOA: 08/09/2023 PCP: [name], NP    Assessment & Plan:   Principal Problem:   Acute on chronic respiratory failure with hypoxia and hypercapnia (HCC) Active Problems:   Acute respiratory failure with hypoxia and hypercapnia (HCC)   Multiple subsegmental pulmonary emboli without acute cor pulmonale (HCC)   Sepsis due to pneumonia (HCC)  Assessment and Plan: Toxic metabolic encephalopathy: likely multifactorial- hospital/ICU delirium, narcotic use from chronic pain, infection. Re-orient prn. Continue w/ supportive care   Aspiration pneumonia: continue on IV zosyn , bronchodilators & encourage incentive spirometry   Chronic pain: continue on home dose of gabapentin   Circulatory shock: see Dr. Clydene notes on how met circulatory shock criteria. Resolved  PAF: continue on IV heparin  drip. Holding home eliquis  and metoprolol    Chronic systolic CHF: holding lasix . Monitor I/Os. Holding home metoprolol , losartan   Acute hypoxic respiratory failure: continue on supplemental oxygen  and wean as tolerated  PE: continue on IV heparin  drip. Holding home eliquis    B/l pleural effusions: holding off on lasix .   AKI: resolved         DVT prophylaxis: IV heparin  drip  Code Status: full  Family Communication: discussed pt's care w/ pt's family at bedside and answered their questions  Disposition Plan: CIR vs SNF   Level of care: Stepdown  Status is: Inpatient Remains inpatient appropriate because: severity of illness    Consultants:  ICU  Procedures:   Antimicrobials: zosyn    Subjective: Pt c/o generalized weakness   Objective: Vitals:   08/17/23 0500 08/17/23 0733 08/17/23 0745 08/17/23 0800  BP:   (!) 136/52 134/79  Pulse: 94  (!) 42 100  Resp: 16  20 12   Temp: 99.9 F (37.7 C)  99.3 F (37.4 C) 99.5 F (37.5 C)  TempSrc:      SpO2: 96% 94% 100% 90%  Weight:      Height:         Intake/Output Summary (Last 24 hours) at 08/17/2023 0855 Last data filed at 08/17/2023 0500 Gross per 24 hour  Intake 874.82 ml  Output 1085 ml  Net -210.18 ml   Filed Weights   08/13/23 0330 08/15/23 0500 08/16/23 0500  Weight: 69.6 kg 70 kg 72 kg    Examination:  General exam: Appears calm but uncomfortable  Respiratory system: decreased breath sounds b/l  Cardiovascular system: S1 & S2 +. No rubs, gallops or clicks.  Gastrointestinal system: Abdomen is nondistended, soft and nontender. Normal bowel sounds heard. Central nervous system: Alert and awake. Moves all extremities  Psychiatry: Judgement and insight appears not at baseline. Flat mood and affect    Data Reviewed: I have personally reviewed following labs and imaging studies  CBC: Recent Labs  Lab 08/11/23 0937 08/12/23 0407 08/13/23 0405 08/14/23 0408 08/15/23 0433 08/16/23 0546 08/17/23 0424  WBC 28.4*   < > 17.8* 15.5* 11.8* 12.9* 14.6*  NEUTROABS 23.9*  --   --   --   --   --   --   HGB 9.8*   < > 7.4* 7.5* 7.5* 7.7* 8.7*  HCT 30.4*   < > 23.3* 24.2* 24.2* 24.5* 27.4*  MCV 87.6   < > 90.3 93.1 92.0 91.8 89.8  PLT 329   < > 339 366 313 343 316   < > = values in this interval not displayed.   Basic Metabolic Panel: Recent Labs  Lab 08/11/23 0153 08/12/23 0407 08/12/23 1544 08/13/23  0405 08/14/23 0408 08/15/23 0433 08/16/23 0546 08/17/23 0424  NA 134* 135  --  140 142 141 138 141  K 4.0 3.2*   < > 3.3* 4.2 3.5 3.3* 4.0  CL 102 102  --  107 108 105 101 100  CO2 24 23  --  24 25 28 29 30   GLUCOSE 113* 242*  --  327* 272* 255* 187* 113*  BUN 19 24*  --  29* 36* 41* 31* 23  CREATININE 0.98 1.01*  --  0.99 0.92 0.85 0.69 0.85  CALCIUM  7.5* 8.0*  --  8.3* 8.4* 8.3* 8.2* 8.6*  MG 2.1 2.0  --  2.2 2.1 1.9  --   --   PHOS 3.3 3.8  --  3.1 3.3 3.5  --   --    < > = values in this interval not displayed.   GFR: Estimated Creatinine Clearance: 50 mL/min (by C-G formula based on SCr of 0.85  mg/dL). Liver Function Tests: Recent Labs  Lab 08/11/23 0153 08/12/23 0407 08/13/23 0405 08/15/23 0433  AST 23 16 11*  --   ALT 37 28 23  --   ALKPHOS 71 78 71  --   BILITOT 0.4 0.5 0.4  --   PROT 5.6* 5.8* 5.8*  --   ALBUMIN 2.3* 2.3* 2.3* 2.1*   No results for input(s): LIPASE, AMYLASE in the last 168 hours. No results for input(s): AMMONIA in the last 168 hours. Coagulation Profile: No results for input(s): INR, PROTIME in the last 168 hours. Cardiac Enzymes: No results for input(s): CKTOTAL, CKMB, CKMBINDEX, TROPONINI in the last 168 hours. BNP (last 3 results) No results for input(s): PROBNP in the last 8760 hours. HbA1C: No results for input(s): HGBA1C in the last 72 hours. CBG: Recent Labs  Lab 08/16/23 1842 08/16/23 2020 08/17/23 0014 08/17/23 0501 08/17/23 0733  GLUCAP 105* 106* 104* 94 91   Lipid Profile: No results for input(s): CHOL, HDL, LDLCALC, TRIG, CHOLHDL, LDLDIRECT in the last 72 hours. Thyroid  Function Tests: No results for input(s): TSH, T4TOTAL, FREET4, T3FREE, THYROIDAB in the last 72 hours. Anemia Panel: No results for input(s): VITAMINB12, FOLATE, FERRITIN, TIBC, IRON , RETICCTPCT in the last 72 hours. Sepsis Labs: Recent Labs  Lab 08/10/23 1717 08/11/23 0937  PROCALCITON  --  3.10  LATICACIDVEN 1.0  --     Recent Results (from the past 240 hours)  Resp panel by RT-PCR (RSV, Flu A&B, Covid) Anterior Nasal Swab     Status: None   Collection Time: 08/09/23  2:14 AM   Specimen: Anterior Nasal Swab  Result Value Ref Range Status   SARS Coronavirus 2 by RT PCR NEGATIVE NEGATIVE Final    Comment: (NOTE) SARS-CoV-2 target nucleic acids are NOT DETECTED.  The SARS-CoV-2 RNA is generally detectable in upper respiratory specimens during the acute phase of infection. The lowest concentration of SARS-CoV-2 viral copies this assay can detect is 138 copies/mL. A negative result does not  preclude SARS-Cov-2 infection and should not be used as the sole basis for treatment or other patient management decisions. A negative result may occur with  improper specimen collection/handling, submission of specimen other than nasopharyngeal swab, presence of viral mutation(s) within the areas targeted by this assay, and inadequate number of viral copies(<138 copies/mL). A negative result must be combined with clinical observations, patient history, and epidemiological information. The expected result is Negative.  Fact Sheet for Patients:  BloggerCourse.com  Fact Sheet for Healthcare Providers:  SeriousBroker.it  This test  is no t yet approved or cleared by the [place]  FDA and  has been authorized for detection and/or diagnosis of SARS-CoV-2 by FDA under an Emergency Use Authorization (EUA). This EUA will remain  in effect (meaning this test can be used) for the duration of the COVID-19 declaration under Section 564(b)(1) of the Act, 21 U.S.C.section 360bbb-3(b)(1), unless the authorization is terminated  or revoked sooner.       Influenza A by PCR NEGATIVE NEGATIVE Final   Influenza B by PCR NEGATIVE NEGATIVE Final    Comment: (NOTE) The Xpert Xpress SARS-CoV-2/FLU/RSV plus assay is intended as an aid in the diagnosis of influenza from Nasopharyngeal swab specimens and should not be used as a sole basis for treatment. Nasal washings and aspirates are unacceptable for Xpert Xpress SARS-CoV-2/FLU/RSV testing.  Fact Sheet for Patients: BloggerCourse.com  Fact Sheet for Healthcare Providers: SeriousBroker.it  This test is not yet approved or cleared by the [place]  FDA and has been authorized for detection and/or diagnosis of SARS-CoV-2 by FDA under an Emergency Use Authorization (EUA). This EUA will remain in effect (meaning this test can be used) for the  duration of the COVID-19 declaration under Section 564(b)(1) of the Act, 21 U.S.C. section 360bbb-3(b)(1), unless the authorization is terminated or revoked.     Resp Syncytial Virus by PCR NEGATIVE NEGATIVE Final    Comment: (NOTE) Fact Sheet for Patients: BloggerCourse.com  Fact Sheet for Healthcare Providers: SeriousBroker.it  This test is not yet approved or cleared by the [place]  FDA and has been authorized for detection and/or diagnosis of SARS-CoV-2 by FDA under an Emergency Use Authorization (EUA). This EUA will remain in effect (meaning this test can be used) for the duration of the COVID-19 declaration under Section 564(b)(1) of the Act, 21 U.S.C. section 360bbb-3(b)(1), unless the authorization is terminated or revoked.  Performed at [facility], [address] [place], [place] [address]   Respiratory (~20 pathogens) panel by PCR     Status: None   Collection Time: 08/09/23  2:14 AM   Specimen: Nasopharyngeal Swab; Respiratory  Result Value Ref Range Status   Adenovirus NOT DETECTED NOT DETECTED Final   Coronavirus 229E NOT DETECTED NOT DETECTED Final    Comment: (NOTE) The Coronavirus on the Respiratory Panel, DOES NOT test for the novel  Coronavirus (2019 nCoV)    Coronavirus HKU1 NOT DETECTED NOT DETECTED Final   Coronavirus NL63 NOT DETECTED NOT DETECTED Final   Coronavirus OC43 NOT DETECTED NOT DETECTED Final   Metapneumovirus NOT DETECTED NOT DETECTED Final   Rhinovirus / Enterovirus NOT DETECTED NOT DETECTED Final   Influenza A NOT DETECTED NOT DETECTED Final   Influenza B NOT DETECTED NOT DETECTED Final   Parainfluenza Virus 1 NOT DETECTED NOT DETECTED Final   Parainfluenza Virus 2 NOT DETECTED NOT DETECTED Final   Parainfluenza Virus 3 NOT DETECTED NOT DETECTED Final   Parainfluenza Virus 4 NOT DETECTED NOT DETECTED Final   Respiratory Syncytial Virus NOT DETECTED NOT DETECTED Final    Bordetella pertussis NOT DETECTED NOT DETECTED Final   Bordetella Parapertussis NOT DETECTED NOT DETECTED Final   Chlamydophila pneumoniae NOT DETECTED NOT DETECTED Final   Mycoplasma pneumoniae NOT DETECTED NOT DETECTED Final    Comment: Performed at [facility], [address] [place], [place] [address]  Blood Culture (routine x 2)     Status: None   Collection Time: 08/09/23  3:10 AM   Specimen: BLOOD  Result Value Ref Range Status  Specimen Description BLOOD LEFT FA  Final   Special Requests   Final    BOTTLES DRAWN AEROBIC AND ANAEROBIC Blood Culture results may not be optimal due to an inadequate volume of blood received in culture bottles   Culture   Final    NO GROWTH 5 DAYS Performed at [facility], [address] [place], [place] [address]    Report Status 08/14/2023 FINAL  Final  MRSA Next Gen by PCR, Nasal     Status: None   Collection Time: 08/09/23  4:15 AM   Specimen: Nasal Swab  Result Value Ref Range Status   MRSA by PCR Next Gen NOT DETECTED NOT DETECTED Final    Comment: (NOTE) The GeneXpert MRSA Assay (FDA approved for NASAL specimens only), is one component of a comprehensive MRSA colonization surveillance program. It is not intended to diagnose MRSA infection nor to guide or monitor treatment for MRSA infections. Test performance is not FDA approved in patients less than 44 years old. Performed at [facility], [address] [place], [place] [address]   Blood Culture (routine x 2)     Status: None   Collection Time: 08/09/23  4:56 AM   Specimen: BLOOD  Result Value Ref Range Status   Specimen Description BLOOD RIGHT ARM  Final   Special Requests   Final    BOTTLES DRAWN AEROBIC AND ANAEROBIC Blood Culture adequate volume   Culture   Final    NO GROWTH 5 DAYS Performed at [facility], [address] [place], [place] [address]    Report Status 08/14/2023 FINAL  Final  Culture, Respiratory w Gram Stain      Status: None   Collection Time: 08/11/23 11:33 AM   Specimen: Tracheal Aspirate; Respiratory  Result Value Ref Range Status   Specimen Description   Final    TRACHEAL ASPIRATE Performed at [facility], [address] [place], [place] [address]    Special Requests   Final    NONE Performed at [facility], [address] [place], [place] [address]    Gram Stain   Final    RARE WBC PRESENT, PREDOMINANTLY PMN NO ORGANISMS SEEN Performed at [facility], [address], [place], [place] [address]    Culture FEW CANDIDA ALBICANS  Final   Report Status 08/14/2023 FINAL  Final         Radiology Studies: No results found.      Scheduled Meds:  Chlorhexidine  Gluconate Cloth  6 each Topical Q2200   gabapentin   400 mg Oral TID   insulin  aspart  0-20 Units Subcutaneous Q4H   insulin  glargine-yfgn  15 Units Subcutaneous Daily   ipratropium-albuterol   3 mL Nebulization TID   Continuous Infusions:  azithromycin  Stopped (08/16/23 1235)   heparin  1,350 Units/hr (08/17/23 0152)   piperacillin -tazobactam (ZOSYN )  IV 3.375 g (08/17/23 0524)     LOS: 8 days      Anthony CHRISTELLA Pouch, MD [facility] Pager [phone]  If 7PM-7AM, please contact night-coverage [email or web address] 08/17/2023, 8:55 AM

## 2023-08-17 NOTE — Progress Notes (Signed)
 Speech Language Pathology Treatment: Dysphagia  Patient Details Name: Jordan Chan MRN: [id number] DOB: December 01, 1950 Today's Date: 08/17/2023 Time: 9154-9084 SLP Time Calculation (min) (ACUTE ONLY): 30 min  Assessment / Plan / Recommendation Clinical Impression  Pt seen for follow up dysphagia intervention targeting trials of upgraded solids and education regarding diet recommendations and aspiration precautions. Pt presents with dry cough, hoarse vocal quality, and report of sore throat, though with some improvement since extubation. Pt now on 2L nasal canula with O2 saturations maintained between 97-99 for duration of trials. Pt seen with trials of thin liquid, nectar thick liquids (magic cup), and soft solids (moistened regular solids). Pt requiring min verbal cues for oral clearance. No overt increase in WOB noted, though pt with reduced endurance for completion of repeated trials, with pt requesting rest intermittently during session. Pt demonstrating slow, though efficient mastication of solids with placement of top denture.   Education provided regarding rationale for aspiration precautions, monitoring endurance, and plan to upgrade diet recommendations. Pt/daughter reported understanding. Given continued low endurance and deconditioning, pt remains at increased risk for aspiration, recommend supervision for PO intake with precautions (slow rate, small bites, elevated HOB, and alert for PO intake). Recommend dys 2 (chopped solids with extra sauce and gravies) and thin liquids. MD and RN made aware. SLP will continue to follow.     HPI HPI: Pt is a 73 y/o F admitted on 08/09/23 after presenting with c/o respiratory distress, O2 sats in the 50s-60s on room air. Pt admitted to ICU for tx of acute hypoxic hypercapnic respiratory failure 2/2 suspected AECOPD, pna.  Pt intubated at admit and successfully extubated on 08/15/23. Pt also found to have acute PE during admission. PMH: B12 deficiency,  chronic pain syndrome on intrathecal pump, opiod use, obesity, paroxysmal a-fib, stress-induced cardiomyopathy, PE, DM, COPD on nocturnal O2, GERD, OSA, IBS, CVA.  No reported difficulty swallowing at home; wears Dentures.   Imaging: Lungs/Pleura: Extensive multifocal airspace consolidation noted  throughout the lungs bilaterally, predominantly in the dependent  portion of the lungs   CXR on 07/23/2023: No active cardiopulmonary disease.      SLP Plan  Continue with current plan of care      Recommendations for follow up therapy are one component of a multi-disciplinary discharge planning process, led by the attending physician.  Recommendations may be updated based on patient status, additional functional criteria and insurance authorization.    Recommendations  Diet recommendations: Dysphagia 2 (fine chop);Thin liquid Liquids provided via: Cup;Straw Medication Administration: Whole meds with puree Supervision: Staff to assist with self feeding Compensations: Minimize environmental distractions;Slow rate;Small sips/bites Postural Changes and/or Swallow Maneuvers: Seated upright 90 degrees;Upright 30-60 min after meal                  Oral care BID;Oral care before and after PO;Staff/trained caregiver to provide oral care     Dysphagia, unspecified (R13.10)     Continue with current plan of care   Jordan Stachia Slutsky Clapp  MS [facility] SLP  Jordan J Chan  08/17/2023, 9:43 AM

## 2023-08-18 DIAGNOSIS — J9622 Acute and chronic respiratory failure with hypercapnia: Secondary | ICD-10-CM | POA: Diagnosis not present

## 2023-08-18 DIAGNOSIS — J9621 Acute and chronic respiratory failure with hypoxia: Secondary | ICD-10-CM | POA: Diagnosis not present

## 2023-08-18 LAB — HEPARIN LEVEL (UNFRACTIONATED): Heparin Unfractionated: 0.68 [IU]/mL (ref 0.30–0.70)

## 2023-08-18 LAB — CBC
HCT: 32.3 % — ABNORMAL LOW (ref 36.0–46.0)
Hemoglobin: 10 g/dL — ABNORMAL LOW (ref 12.0–15.0)
MCH: 27.6 pg (ref 26.0–34.0)
MCHC: 31 g/dL (ref 30.0–36.0)
MCV: 89.2 fL (ref 80.0–100.0)
Platelets: 460 10*3/uL — ABNORMAL HIGH (ref 150–400)
RBC: 3.62 MIL/uL — ABNORMAL LOW (ref 3.87–5.11)
RDW: 16.3 % — ABNORMAL HIGH (ref 11.5–15.5)
WBC: 13.5 10*3/uL — ABNORMAL HIGH (ref 4.0–10.5)
nRBC: 0 % (ref 0.0–0.2)

## 2023-08-18 LAB — GLUCOSE, CAPILLARY
Glucose-Capillary: 113 mg/dL — ABNORMAL HIGH (ref 70–99)
Glucose-Capillary: 149 mg/dL — ABNORMAL HIGH (ref 70–99)
Glucose-Capillary: 129 mg/dL — ABNORMAL HIGH (ref 70–99)
Glucose-Capillary: 138 mg/dL — ABNORMAL HIGH (ref 70–99)

## 2023-08-18 LAB — APTT: aPTT: 69 s — ABNORMAL HIGH (ref 24–36)

## 2023-08-18 MED ORDER — LOSARTAN POTASSIUM 25 MG PO TABS
12.5000 mg | ORAL_TABLET | Freq: Every day | ORAL | Status: DC
  Administered 2023-08-18 – 2023-08-22 (×5): 12.5 mg via ORAL
  Filled 2023-08-18 (×2): qty 1
  Filled 2023-08-18: qty 0.5
  Filled 2023-08-18: qty 1
  Filled 2023-08-18: qty 0.5

## 2023-08-18 MED ORDER — APIXABAN 5 MG PO TABS
5.0000 mg | ORAL_TABLET | Freq: Two times a day (BID) | ORAL | Status: DC
  Administered 2023-08-18 – 2023-08-22 (×9): 5 mg via ORAL
  Filled 2023-08-18 (×9): qty 1

## 2023-08-18 MED ORDER — ALUM & MAG HYDROXIDE-SIMETH 200-200-20 MG/5ML PO SUSP
30.0000 mL | Freq: Four times a day (QID) | ORAL | Status: DC | PRN
  Administered 2023-08-18: 30 mL via ORAL
  Filled 2023-08-18: qty 30

## 2023-08-18 NOTE — Progress Notes (Signed)
 PROGRESS NOTE   HPI was taken from NP Ouma: 73 y.o female with significant PMH of B12 deficiency, chronic pain syndrome s/p intrathecal pump, paroxysmal atrial fibrillation, stress-induced cardiomyopathy in the setting of pain pump malfunction and opioid withdrawal, PE following hospitalization for pneumonia, DM, COPD, GERD, OSA, and IBS who presented to the ED with chief complaints of acute respiratory distress found with sats in the 50 to 60% on RA per EMS.   ED Course: Initial vital signs showed HR of 142 beats/minute, BP mm Hg, the RR 23 breaths/minute, and the oxygen  saturation 100% on BVM and a temperature of 92.51F (33.3C).  Patient was intubated for airway protection. Pertinent Labs/Diagnostics Findings: Na+/ K+: 136/4.2 glucose: 418 BUN/Cr.:  13/1.23 AST/ALT: 97/51, CO2 14, anion gap 20 WBC: 25.2 K/L without bands or neutrophil predominance, Plts: 530 PCT:  Lactic acid: >9.0 COVID PCR: Negative,  troponin: 20  BNP:241.9 ABG: pO2 96; pCO2 51; pH 7.03;  HCO3 13.5, %O2 Sat 96.4.  CXR> see result ED Medications: Patient treated with Duonebs, Solumedrol 125 mg x 1, 30 cc/kg of fluids and started on broad-spectrum antibiotics Vanco cefepime  and Flagyl  for suspected sepsis  Disposition: ICU  As per Dr. [name]: 1/5: Admitted to ICU with acute hypoxic hypercapnic respiratory failure secondary to suspected pneumonia and AECOPD. 1/6: failed SBT, hypertensive off sedation. Failed switch to dexmedetomidine  1/7: failed extubation, had to be re-intubated at the bedside 1/8: switched to full code, switch fentanyl  to hydromorphone  1/9: remains on mechanical ventilation, better sedated 1/10: add precedex , re-attempt SBT 1/11: SBT on precedex  successful, extubated to [facility]   Jordan Chan  FMW:[id number] DOB: October 12, 1950 DOA: 08/09/2023 PCP: [name], NP    Assessment & Plan:   Principal Problem:   Acute on chronic respiratory failure with hypoxia and hypercapnia (HCC) Active  Problems:   Acute respiratory failure with hypoxia and hypercapnia (HCC)   Multiple subsegmental pulmonary emboli without acute cor pulmonale (HCC)   Sepsis due to pneumonia (HCC)  Assessment and Plan: Toxic metabolic encephalopathy: likely multifactorial- hospital/ICU delirium, narcotic use from chronic pain, infection. Much improved. Re-orient prn. Continue w/ supportive care   Generalized weakness: PT recs SNF   Aspiration pneumonia: completed abx course. Continue on bronchodilators & encourage incentive spirometry   Chronic pain: continue on home dose of gabapentin   Circulatory shock: see Dr. Clydene notes on how met circulatory shock criteria. Resolved  PAF: d/c IV heparin  drip and restart home dose of eliquis . Holding home metoprolol    Chronic systolic CHF: holding lasix . Monitor I/Os. Restart home dose of losartan . Holding home metoprolol   Acute hypoxic respiratory failure: continue on supplemental oxygen  and wean as tolerated  PE: d/c IV heparin . Restart home dose of eliquis    B/l pleural effusions: holding off on lasix .   AKI: resolved   Depression: severity unknown. Continue on home dose of buspar , venlafaxine        DVT prophylaxis: eliquis  Code Status: full  Family Communication: discussed pt's care w/ pt's family at bedside and answered their questions  Disposition Plan: likely d/c to SNF   Level of care: Stepdown  Status is: Inpatient Remains inpatient appropriate because: severity of illness    Consultants:  ICU  Procedures:   Antimicrobials: zosyn    Subjective: Pt c/o malaise   Objective: Vitals:   08/18/23 0800 08/18/23 0900 08/18/23 1000 08/18/23 1100  BP: (!) 144/69 129/83 (!) 142/63 138/73  Pulse: 97 (!) 51 (!) 101 93  Resp: 19 18 16 19   Temp: 98.7 F (  37.1 C)     TempSrc: Axillary     SpO2: 97% 100% 96% 98%  Weight:      Height:        Intake/Output Summary (Last 24 hours) at 08/18/2023 1151 Last data filed at 08/18/2023  1141 Gross per 24 hour  Intake 838.02 ml  Output 1750 ml  Net -911.98 ml   Filed Weights   08/15/23 0500 08/16/23 0500 08/18/23 0500  Weight: 70 kg 72 kg 72 kg    Examination:  General exam: Appears uncomfortable  Respiratory system: diminished breath sounds b/l  Cardiovascular system: S1/S2+. No rubs or clicks  Gastrointestinal system: abd is soft, NT, obese & normal bowel sounds  Central nervous system: alert & awake. Moves all extremities  Psychiatry:  judgement and insight appears improved. Flat mood and affect    Data Reviewed: I have personally reviewed following labs and imaging studies  CBC: Recent Labs  Lab 08/14/23 0408 08/15/23 0433 08/16/23 0546 08/17/23 0424 08/18/23 0433  WBC 15.5* 11.8* 12.9* 14.6* 13.5*  HGB 7.5* 7.5* 7.7* 8.7* 10.0*  HCT 24.2* 24.2* 24.5* 27.4* 32.3*  MCV 93.1 92.0 91.8 89.8 89.2  PLT 366 313 343 316 460*   Basic Metabolic Panel: Recent Labs  Lab 08/12/23 0407 08/12/23 1544 08/13/23 0405 08/14/23 0408 08/15/23 0433 08/16/23 0546 08/17/23 0424  NA 135  --  140 142 141 138 141  K 3.2*   < > 3.3* 4.2 3.5 3.3* 4.0  CL 102  --  107 108 105 101 100  CO2 23  --  24 25 28 29 30   GLUCOSE 242*  --  327* 272* 255* 187* 113*  BUN 24*  --  29* 36* 41* 31* 23  CREATININE 1.01*  --  0.99 0.92 0.85 0.69 0.85  CALCIUM  8.0*  --  8.3* 8.4* 8.3* 8.2* 8.6*  MG 2.0  --  2.2 2.1 1.9  --   --   PHOS 3.8  --  3.1 3.3 3.5  --   --    < > = values in this interval not displayed.   GFR: Estimated Creatinine Clearance: 50 mL/min (by C-G formula based on SCr of 0.85 mg/dL). Liver Function Tests: Recent Labs  Lab 08/12/23 0407 08/13/23 0405 08/15/23 0433  AST 16 11*  --   ALT 28 23  --   ALKPHOS 78 71  --   BILITOT 0.5 0.4  --   PROT 5.8* 5.8*  --   ALBUMIN 2.3* 2.3* 2.1*   No results for input(s): LIPASE, AMYLASE in the last 168 hours. No results for input(s): AMMONIA in the last 168 hours. Coagulation Profile: No results for  input(s): INR, PROTIME in the last 168 hours. Cardiac Enzymes: No results for input(s): CKTOTAL, CKMB, CKMBINDEX, TROPONINI in the last 168 hours. BNP (last 3 results) No results for input(s): PROBNP in the last 8760 hours. HbA1C: No results for input(s): HGBA1C in the last 72 hours. CBG: Recent Labs  Lab 08/17/23 1126 08/17/23 1631 08/17/23 2037 08/18/23 0807 08/18/23 1104  GLUCAP 140* 113* 115* 113* 149*   Lipid Profile: No results for input(s): CHOL, HDL, LDLCALC, TRIG, CHOLHDL, LDLDIRECT in the last 72 hours. Thyroid  Function Tests: No results for input(s): TSH, T4TOTAL, FREET4, T3FREE, THYROIDAB in the last 72 hours. Anemia Panel: No results for input(s): VITAMINB12, FOLATE, FERRITIN, TIBC, IRON , RETICCTPCT in the last 72 hours. Sepsis Labs: No results for input(s): PROCALCITON, LATICACIDVEN in the last 168 hours.   Recent Results (from the  past 240 hours)  Resp panel by RT-PCR (RSV, Flu A&B, Covid) Anterior Nasal Swab     Status: None   Collection Time: 08/09/23  2:14 AM   Specimen: Anterior Nasal Swab  Result Value Ref Range Status   SARS Coronavirus 2 by RT PCR NEGATIVE NEGATIVE Final    Comment: (NOTE) SARS-CoV-2 target nucleic acids are NOT DETECTED.  The SARS-CoV-2 RNA is generally detectable in upper respiratory specimens during the acute phase of infection. The lowest concentration of SARS-CoV-2 viral copies this assay can detect is 138 copies/mL. A negative result does not preclude SARS-Cov-2 infection and should not be used as the sole basis for treatment or other patient management decisions. A negative result may occur with  improper specimen collection/handling, submission of specimen other than nasopharyngeal swab, presence of viral mutation(s) within the areas targeted by this assay, and inadequate number of viral copies(<138 copies/mL). A negative result must be combined with clinical  observations, patient history, and epidemiological information. The expected result is Negative.  Fact Sheet for Patients:  BloggerCourse.com  Fact Sheet for Healthcare Providers:  SeriousBroker.it  This test is no t yet approved or cleared by the [place]  FDA and  has been authorized for detection and/or diagnosis of SARS-CoV-2 by FDA under an Emergency Use Authorization (EUA). This EUA will remain  in effect (meaning this test can be used) for the duration of the COVID-19 declaration under Section 564(b)(1) of the Act, 21 U.S.C.section 360bbb-3(b)(1), unless the authorization is terminated  or revoked sooner.       Influenza A by PCR NEGATIVE NEGATIVE Final   Influenza B by PCR NEGATIVE NEGATIVE Final    Comment: (NOTE) The Xpert Xpress SARS-CoV-2/FLU/RSV plus assay is intended as an aid in the diagnosis of influenza from Nasopharyngeal swab specimens and should not be used as a sole basis for treatment. Nasal washings and aspirates are unacceptable for Xpert Xpress SARS-CoV-2/FLU/RSV testing.  Fact Sheet for Patients: BloggerCourse.com  Fact Sheet for Healthcare Providers: SeriousBroker.it  This test is not yet approved or cleared by the [place]  FDA and has been authorized for detection and/or diagnosis of SARS-CoV-2 by FDA under an Emergency Use Authorization (EUA). This EUA will remain in effect (meaning this test can be used) for the duration of the COVID-19 declaration under Section 564(b)(1) of the Act, 21 U.S.C. section 360bbb-3(b)(1), unless the authorization is terminated or revoked.     Resp Syncytial Virus by PCR NEGATIVE NEGATIVE Final    Comment: (NOTE) Fact Sheet for Patients: BloggerCourse.com  Fact Sheet for Healthcare Providers: SeriousBroker.it  This test is not yet approved or cleared by  the [place]  FDA and has been authorized for detection and/or diagnosis of SARS-CoV-2 by FDA under an Emergency Use Authorization (EUA). This EUA will remain in effect (meaning this test can be used) for the duration of the COVID-19 declaration under Section 564(b)(1) of the Act, 21 U.S.C. section 360bbb-3(b)(1), unless the authorization is terminated or revoked.  Performed at [facility], [address] [place], [place] [address]   Respiratory (~20 pathogens) panel by PCR     Status: None   Collection Time: 08/09/23  2:14 AM   Specimen: Nasopharyngeal Swab; Respiratory  Result Value Ref Range Status   Adenovirus NOT DETECTED NOT DETECTED Final   Coronavirus 229E NOT DETECTED NOT DETECTED Final    Comment: (NOTE) The Coronavirus on the Respiratory Panel, DOES NOT test for the novel  Coronavirus (2019 nCoV)    Coronavirus HKU1 NOT  DETECTED NOT DETECTED Final   Coronavirus NL63 NOT DETECTED NOT DETECTED Final   Coronavirus OC43 NOT DETECTED NOT DETECTED Final   Metapneumovirus NOT DETECTED NOT DETECTED Final   Rhinovirus / Enterovirus NOT DETECTED NOT DETECTED Final   Influenza A NOT DETECTED NOT DETECTED Final   Influenza B NOT DETECTED NOT DETECTED Final   Parainfluenza Virus 1 NOT DETECTED NOT DETECTED Final   Parainfluenza Virus 2 NOT DETECTED NOT DETECTED Final   Parainfluenza Virus 3 NOT DETECTED NOT DETECTED Final   Parainfluenza Virus 4 NOT DETECTED NOT DETECTED Final   Respiratory Syncytial Virus NOT DETECTED NOT DETECTED Final   Bordetella pertussis NOT DETECTED NOT DETECTED Final   Bordetella Parapertussis NOT DETECTED NOT DETECTED Final   Chlamydophila pneumoniae NOT DETECTED NOT DETECTED Final   Mycoplasma pneumoniae NOT DETECTED NOT DETECTED Final    Comment: Performed at [facility], [address], [place], [place] [address]  Blood Culture (routine x 2)     Status: None   Collection Time: 08/09/23  3:10 AM   Specimen: BLOOD  Result Value  Ref Range Status   Specimen Description BLOOD LEFT FA  Final   Special Requests   Final    BOTTLES DRAWN AEROBIC AND ANAEROBIC Blood Culture results may not be optimal due to an inadequate volume of blood received in culture bottles   Culture   Final    NO GROWTH 5 DAYS Performed at [facility], [address] [place], [place] [address]    Report Status 08/14/2023 FINAL  Final  MRSA Next Gen by PCR, Nasal     Status: None   Collection Time: 08/09/23  4:15 AM   Specimen: Nasal Swab  Result Value Ref Range Status   MRSA by PCR Next Gen NOT DETECTED NOT DETECTED Final    Comment: (NOTE) The GeneXpert MRSA Assay (FDA approved for NASAL specimens only), is one component of a comprehensive MRSA colonization surveillance program. It is not intended to diagnose MRSA infection nor to guide or monitor treatment for MRSA infections. Test performance is not FDA approved in patients less than 60 years old. Performed at [facility], [address] [place], [place] [address]   Blood Culture (routine x 2)     Status: None   Collection Time: 08/09/23  4:56 AM   Specimen: BLOOD  Result Value Ref Range Status   Specimen Description BLOOD RIGHT ARM  Final   Special Requests   Final    BOTTLES DRAWN AEROBIC AND ANAEROBIC Blood Culture adequate volume   Culture   Final    NO GROWTH 5 DAYS Performed at [facility], [address], [place], [place] [address]    Report Status 08/14/2023 FINAL  Final  Culture, Respiratory w Gram Stain     Status: None   Collection Time: 08/11/23 11:33 AM   Specimen: Tracheal Aspirate; Respiratory  Result Value Ref Range Status   Specimen Description   Final    TRACHEAL ASPIRATE Performed at [facility], [address], [place], [place] [address]    Special Requests   Final    NONE Performed at [facility], [address] [place], [place] [address]    Gram Stain   Final    RARE WBC PRESENT, PREDOMINANTLY  PMN NO ORGANISMS SEEN Performed at [facility], [address] [place], [place] [address]    Culture FEW CANDIDA ALBICANS  Final   Report Status 08/14/2023 FINAL  Final         Radiology Studies: No results found.      Scheduled Meds:  busPIRone   5 mg Oral BID   Chlorhexidine  Gluconate Cloth  6 each Topical Q2200   feeding supplement  237 mL Oral TID BM   gabapentin   400 mg Oral TID   insulin  aspart  0-20 Units Subcutaneous TID AC & HS   insulin  glargine-yfgn  10 Units Subcutaneous Daily   ipratropium-albuterol   3 mL Nebulization TID   multivitamin with minerals  1 tablet Oral Daily   venlafaxine  XR  150 mg Oral Q breakfast   Continuous Infusions:  heparin  1,350 Units/hr (08/18/23 1141)     LOS: 9 days      Jordan CHRISTELLA Pouch, MD [facility] Pager [phone]  If 7PM-7AM, please contact night-coverage [email or web address] 08/18/2023, 11:51 AM

## 2023-08-18 NOTE — TOC Initial Note (Signed)
 Transition of Care [facility]) - Initial/Assessment Note    Patient Details  Name: Jordan Chan MRN: [id number] Date of Birth: 03-13-1951  [facility] CM/SW Contact:    Jordan JAYSON Carpen, LCSW Phone Number: 08/18/2023, 3:51 PM  Clinical Narrative:   Readmission prevention screen complete. CSW met with patient. Daughter at bedside. CSW introduced role and explained that discharge planning would be discussed. PCP is Jordan [name], NP. Patient drives herself to appointments. [facility]. No issues obtaining medications. Patient lives home with son. No home health prior to admission. She does not use DME to get around at home. She uses 2 L home oxygen  at nighttime and thinks the company is [employer]. Discussed PT recommendations. Patient and daughter would like to discuss SNF to determine if they want to move forward with that. CSW will send out referral to see what her options are. No further concerns.                Expected Discharge Plan: Skilled Nursing Facility Barriers to Discharge: Continued Medical Work up   Patient Goals and CMS Choice            Expected Discharge Plan and Services     Post Acute Care Choice:  (TBD) Living arrangements for the past 2 months: Single Family Home                                      Prior Living Arrangements/Services Living arrangements for the past 2 months: Single Family Home Lives with:: Adult Children Patient language and need for interpreter reviewed:: Yes Do you feel safe going back to the place where you live?: Yes      Need for Family Participation in Patient Care: Yes (Comment) Care giver support system in place?: Yes (comment) Current home services: DME Criminal Activity/Legal Involvement Pertinent to Current Situation/Hospitalization: No - Comment as needed  Activities of Daily Living   ADL Screening (condition at time of admission) Independently performs ADLs?: No (uta) Does the  patient have a NEW difficulty with bathing/dressing/toileting/self-feeding that is expected to last >3 days?: Yes (Initiates electronic notice to provider for possible OT consult) Does the patient have a NEW difficulty with getting in/out of bed, walking, or climbing stairs that is expected to last >3 days?: Yes (Initiates electronic notice to provider for possible PT consult) Does the patient have a NEW difficulty with communication that is expected to last >3 days?: Yes (Initiates electronic notice to provider for possible SLP consult) Is the patient deaf or have difficulty hearing?: No (uta) Does the patient have difficulty seeing, even when wearing glasses/contacts?: No (uta) Does the patient have difficulty concentrating, remembering, or making decisions?: No (uta)  Permission Sought/Granted Permission sought to share information with : Facility Medical sales representative, Family Supports Permission granted to share information with : Yes, Verbal Permission Granted  Share Information with NAME: Jordan Chan  Permission granted to share info w AGENCY: SNF's  Permission granted to share info w Relationship: Daughter  Permission granted to share info w Contact Information: [phone]  Emotional Assessment Appearance:: Appears stated age Attitude/Demeanor/Rapport: Engaged, Gracious Affect (typically observed): Accepting, Appropriate, Calm, Pleasant Orientation: : Oriented to Self, Oriented to Place, Oriented to  Time, Oriented to Situation Alcohol  / Substance Use: Not Applicable Psych Involvement: No (comment)  Admission diagnosis:  Acute on chronic respiratory failure with hypoxia and hypercapnia (HCC) [G03.78,  J96.22] Patient Active Problem List   Diagnosis Date Noted   Acute on chronic respiratory failure with hypoxia and hypercapnia (HCC) 08/09/2023   DDD (degenerative disc disease), lumbosacral w/ LBP & LEP 08/04/2023   Chronic radicular pain of lower extremity 08/04/2023    Encounter for adjustment and management of infusion pump 08/04/2023   Chronic use of opiate for therapeutic purpose 08/04/2023   Opiate dependence, continuous (HCC) 08/04/2023   Abnormal x-ray of cervical spine (07/04/2023) 07/07/2023   Chronic hip pain (Left) 06/17/2023   Abnormal MRI, lumbar spine (08/26/2019) 06/17/2023   Chronic lower extremity pain (2ry area of Pain) (Bilateral) (R>L) 06/17/2023   Chronic neck pain (3ry area of Pain) (Bilateral) (L>R) 06/17/2023   Cervicalgia 06/17/2023   History of radiation therapy 06/03/2023   History of antineoplastic chemotherapy 06/03/2023   Osteopenia of left forearm 06/03/2023   Status post total hip replacement, left 12/30/2022   Persistent atrial fibrillation (HCC) 10/22/2022   Acute respiratory failure with hypoxia and hypercapnia (HCC) 07/14/2022   Sepsis due to pneumonia (HCC) 09/05/2021   Chronic HFrEF (heart failure with reduced ejection fraction) (HCC) 01/18/2021   Iron  deficiency anemia 12/28/2020   Insulin  dependent type 2 diabetes mellitus (HCC) 07/20/2020   SOB (shortness of breath) on exertion 07/04/2020   History of pulmonary embolus (PE) 07/04/2020   Aortic atherosclerosis (HCC) 07/01/2020   Multiple subsegmental pulmonary emboli without acute cor pulmonale (HCC) 12/28/2019   Vitamin D  deficiency 07/17/2019   Nonischemic cardiomyopathy (HCC) 07/23/2017   History of non-ST elevation myocardial infarction (NSTEMI)    Presence of intrathecal pump 04/27/2017   Advanced care planning/counseling discussion 11/14/2016   Cervical scoliosis 05/20/2016   Torticollis 04/28/2016   Essential hypertension 06/18/2015   B12 deficiency 03/19/2015   Sleep apnea 11/16/2014   Allergic rhinitis 11/16/2014   History of breast cancer 11/16/2014   GERD (gastroesophageal reflux disease) 11/16/2014   COPD, moderate (HCC) 11/16/2014   Obesity (BMI 30-39.9) 11/16/2014   Depression, major, single episode, in partial remission (HCC) 11/16/2014    Diabetic peripheral neuropathy (HCC) 11/16/2014   CKD stage 3a, GFR 45-59 ml/min (HCC) 11/16/2014   Hyperlipidemia associated with type 2 diabetes mellitus (HCC) 11/16/2014   History of sarcoma 09/27/2013   Chronic low back pain (1ry area of Pain) (Bilateral) (R>L) w/o sciatica 09/30/2012   Chronic pain syndrome 09/30/2012   PCP:  [name], NP Pharmacy:   [facility] [address] [place] - [address] [place] [address] Phone: [phone] Fax: [phone]  [facility] - [place], [place] - [address] [place] [address] Phone: [phone] Fax: [phone]     Social Drivers of Health (SDOH) Social History: SDOH Screenings   Food Insecurity: No Food Insecurity (04/16/2023)  Housing: Low Risk  (04/16/2023)  Transportation Needs: No Transportation Needs (04/16/2023)  Utilities: Patient Unable To Answer (08/09/2023)  Alcohol  Screen: Low Risk  (12/23/2022)  Depression (PHQ2-9): Low Risk  (08/04/2023)  Recent Concern: Depression (PHQ2-9) - Medium Risk (07/27/2023)  Financial Resource Strain: Low Risk  (12/23/2022)  Physical Activity: Inactive (12/23/2022)  Social Connections: Patient Unable To Answer (08/09/2023)  Stress: No Stress Concern Present (12/23/2022)  Tobacco Use: Medium Risk (08/10/2023)   SDOH Interventions:     Readmission Risk Interventions    08/18/2023    2:50 PM  Readmission Risk Prevention Plan  Transportation Screening Complete  Medication Review (RN Care Manager) Complete  PCP or Specialist appointment within 3-5 days  of discharge Complete  SW Recovery Care/Counseling Consult Complete  Palliative Care Screening Not Applicable  Skilled Nursing Facility Complete

## 2023-08-18 NOTE — [place] FL2 (Signed)
 [place]  MEDICAID FL2 LEVEL OF CARE FORM     IDENTIFICATION  Patient Name: SAKI LEGORE Birthdate: [birth year] Sex: female Admission Date (Current Location): 08/09/2023  [place] and [place] Number:  Chiropodist and Address:  [facility], [address] [place], [place] [address]      Provider Number: [id number]  Attending Physician Name and Address:  Trudy Anthony HERO, MD  Relative Name and Phone Number:       Current Level of Care: Hospital Recommended Level of Care: Skilled Nursing Facility Prior Approval Number:    Date Approved/Denied:   PASRR Number: Need to confirm SS# with patient.  Discharge Plan: SNF    Current Diagnoses: Patient Active Problem List   Diagnosis Date Noted   Acute on chronic respiratory failure with hypoxia and hypercapnia (HCC) 08/09/2023   DDD (degenerative disc disease), lumbosacral w/ LBP & LEP 08/04/2023   Chronic radicular pain of lower extremity 08/04/2023   Encounter for adjustment and management of infusion pump 08/04/2023   Chronic use of opiate for therapeutic purpose 08/04/2023   Opiate dependence, continuous (HCC) 08/04/2023   Abnormal x-ray of cervical spine (07/04/2023) 07/07/2023   Chronic hip pain (Left) 06/17/2023   Abnormal MRI, lumbar spine (08/26/2019) 06/17/2023   Chronic lower extremity pain (2ry area of Pain) (Bilateral) (R>L) 06/17/2023   Chronic neck pain (3ry area of Pain) (Bilateral) (L>R) 06/17/2023   Cervicalgia 06/17/2023   History of radiation therapy 06/03/2023   History of antineoplastic chemotherapy 06/03/2023   Osteopenia of left forearm 06/03/2023   Status post total hip replacement, left 12/30/2022   Persistent atrial fibrillation (HCC) 10/22/2022   Acute respiratory failure with hypoxia and hypercapnia (HCC) 07/14/2022   Sepsis due to pneumonia (HCC) 09/05/2021   Chronic HFrEF (heart failure with reduced ejection fraction) (HCC) 01/18/2021   Iron  deficiency  anemia 12/28/2020   Insulin  dependent type 2 diabetes mellitus (HCC) 07/20/2020   SOB (shortness of breath) on exertion 07/04/2020   History of pulmonary embolus (PE) 07/04/2020   Aortic atherosclerosis (HCC) 07/01/2020   Multiple subsegmental pulmonary emboli without acute cor pulmonale (HCC) 12/28/2019   Vitamin D  deficiency 07/17/2019   Nonischemic cardiomyopathy (HCC) 07/23/2017   History of non-ST elevation myocardial infarction (NSTEMI)    Presence of intrathecal pump 04/27/2017   Advanced care planning/counseling discussion 11/14/2016   Cervical scoliosis 05/20/2016   Torticollis 04/28/2016   Essential hypertension 06/18/2015   B12 deficiency 03/19/2015   Sleep apnea 11/16/2014   Allergic rhinitis 11/16/2014   History of breast cancer 11/16/2014   GERD (gastroesophageal reflux disease) 11/16/2014   COPD, moderate (HCC) 11/16/2014   Obesity (BMI 30-39.9) 11/16/2014   Depression, major, single episode, in partial remission (HCC) 11/16/2014   Diabetic peripheral neuropathy (HCC) 11/16/2014   CKD stage 3a, GFR 45-59 ml/min (HCC) 11/16/2014   Hyperlipidemia associated with type 2 diabetes mellitus (HCC) 11/16/2014   History of sarcoma 09/27/2013   Chronic low back pain (1ry area of Pain) (Bilateral) (R>L) w/o sciatica 09/30/2012   Chronic pain syndrome 09/30/2012    Orientation RESPIRATION BLADDER Height & Weight     Self, Time, Situation, Place  O2 (Nasal Cannula 2 L) Incontinent, External catheter Weight: 158 lb 11.7 oz (72 kg) Height:  4' 10.27 (148 cm)  BEHAVIORAL SYMPTOMS/MOOD NEUROLOGICAL BOWEL NUTRITION STATUS   (None)   Continent Diet (DYS 2. Extra sauces and gravies.)  AMBULATORY STATUS COMMUNICATION OF NEEDS Skin   Extensive Assist Verbally Skin abrasions, Bruising  Personal Care Assistance Level of Assistance  Bathing, Feeding, Dressing Bathing Assistance: Maximum assistance Feeding assistance: Limited assistance Dressing  Assistance: Maximum assistance     Functional Limitations Info  Sight, Hearing, Speech Sight Info: Adequate Hearing Info: Adequate Speech Info: Adequate    SPECIAL CARE FACTORS FREQUENCY  PT (By licensed PT), OT (By licensed OT)     PT Frequency: 5 x week OT Frequency: 5 x week            Contractures Contractures Info: Not present    Additional Factors Info  Code Status, Allergies Code Status Info: Full code Allergies Info: IV steroids, Pain Patch (Menthol), Avelox  (Moxifloxacin  Hcl In Nacl), Doxycycline , Erythromycin , Fentanyl , Moxifloxacin  Hcl, Oxycontin (Oxycodone), Ozempic (Semaglutide)           Current Medications (08/18/2023):  This is the current hospital active medication list Current Facility-Administered Medications  Medication Dose Route Frequency Provider Last Rate Last Admin   acetaminophen  (TYLENOL ) tablet 975 mg  975 mg Oral Q6H PRN [name] Hose, MD       alum & mag hydroxide-simeth (MAALOX/MYLANTA) 200-200-20 MG/5ML suspension 30 mL  30 mL Oral Q6H PRN Trudy Anthony HERO, MD   30 mL at 08/18/23 1045   apixaban  (ELIQUIS ) tablet 5 mg  5 mg Oral BID Nazari, Walid A, RPH   5 mg at 08/18/23 1241   bisacodyl  (DULCOLAX) suppository 10 mg  10 mg Rectal Daily PRN Grubb, Rodney D, RPH   10 mg at 08/17/23 1323   busPIRone  (BUSPAR ) tablet 5 mg  5 mg Oral BID Trudy Anthony HERO, MD   5 mg at 08/18/23 9053   Chlorhexidine  Gluconate Cloth 2 % PADS 6 each  6 each Topical Q2200 Parris Manna, MD   6 each at 08/17/23 2200   dextrose  50 % solution 0-50 mL  0-50 mL Intravenous PRN Nelson, Dana G, NP       docusate sodium  (COLACE) capsule 100 mg  100 mg Oral BID PRN Chappell, Alex B, RPH   100 mg at 08/17/23 1023   feeding supplement (ENSURE ENLIVE / ENSURE PLUS) liquid 237 mL  237 mL Oral TID BM Trudy Anthony HERO, MD   237 mL at 08/17/23 1422   gabapentin  (NEURONTIN ) capsule 400 mg  400 mg Oral TID Chappell, Alex B, RPH   400 mg at 08/18/23 0946   insulin  aspart  (novoLOG ) injection 0-20 Units  0-20 Units Subcutaneous TID [facility] Trudy Anthony HERO, MD   3 Units at 08/18/23 1128   insulin  glargine-yfgn (SEMGLEE ) injection 10 Units  10 Units Subcutaneous Daily Trudy Anthony HERO, MD   10 Units at 08/18/23 0946   ipratropium-albuterol  (DUONEB) 0.5-2.5 (3) MG/3ML nebulizer solution 3 mL  3 mL Nebulization Q6H PRN Kathrene Almarie Bake, NP       ipratropium-albuterol  (DUONEB) 0.5-2.5 (3) MG/3ML nebulizer solution 3 mL  3 mL Nebulization TID [name] Hose, MD   3 mL at 08/18/23 0755   labetalol  (NORMODYNE ) injection 10 mg  10 mg Intravenous Q2H PRN Keene, Jeremiah D, NP   10 mg at 08/11/23 0831   losartan  (COZAAR ) tablet 12.5 mg  12.5 mg Oral Daily Trudy Anthony HERO, MD       multivitamin with minerals tablet 1 tablet  1 tablet Oral Daily Trudy Anthony HERO, MD   1 tablet at 08/18/23 0946   ondansetron  (ZOFRAN ) injection 4 mg  4 mg Intravenous Q6H PRN Kathrene Almarie Bake, NP   4 mg at 08/18/23 0948   polyethylene  glycol (MIRALAX  / GLYCOLAX ) packet 17 g  17 g Oral Daily PRN Kathrene Almarie Bake, NP   17 g at 08/18/23 0946   polyvinyl alcohol  (LIQUIFILM TEARS) 1.4 % ophthalmic solution 2 drop  2 drop Both Eyes PRN Nelson, Dana G, NP       venlafaxine  XR (EFFEXOR -XR) 24 hr capsule 150 mg  150 mg Oral Q breakfast Williams, Jamiese M, MD   150 mg at 08/18/23 9172     Discharge Medications: Please see discharge summary for a list of discharge medications.  Relevant Imaging Results:  Relevant Lab Results:   Additional Information SS#: [id number]. Need to make sure this social is correct. [place] [facility] has a different one on file.  Lauraine JAYSON Carpen, LCSW

## 2023-08-19 DIAGNOSIS — J9622 Acute and chronic respiratory failure with hypercapnia: Secondary | ICD-10-CM | POA: Diagnosis not present

## 2023-08-19 DIAGNOSIS — J9621 Acute and chronic respiratory failure with hypoxia: Secondary | ICD-10-CM | POA: Diagnosis not present

## 2023-08-19 LAB — CBC
HCT: 29.5 % — ABNORMAL LOW (ref 36.0–46.0)
Hemoglobin: 9 g/dL — ABNORMAL LOW (ref 12.0–15.0)
MCH: 28.1 pg (ref 26.0–34.0)
MCHC: 30.5 g/dL (ref 30.0–36.0)
MCV: 92.2 fL (ref 80.0–100.0)
Platelets: 475 10*3/uL — ABNORMAL HIGH (ref 150–400)
RBC: 3.2 MIL/uL — ABNORMAL LOW (ref 3.87–5.11)
RDW: 16 % — ABNORMAL HIGH (ref 11.5–15.5)
WBC: 11.9 10*3/uL — ABNORMAL HIGH (ref 4.0–10.5)
nRBC: 0 % (ref 0.0–0.2)

## 2023-08-19 LAB — GLUCOSE, CAPILLARY
Glucose-Capillary: 109 mg/dL — ABNORMAL HIGH (ref 70–99)
Glucose-Capillary: 232 mg/dL — ABNORMAL HIGH (ref 70–99)
Glucose-Capillary: 117 mg/dL — ABNORMAL HIGH (ref 70–99)
Glucose-Capillary: 151 mg/dL — ABNORMAL HIGH (ref 70–99)

## 2023-08-19 LAB — BASIC METABOLIC PANEL
Anion gap: 13 (ref 5–15)
BUN: 12 mg/dL (ref 8–23)
CO2: 30 mmol/L (ref 22–32)
Calcium: 8.7 mg/dL — ABNORMAL LOW (ref 8.9–10.3)
Chloride: 93 mmol/L — ABNORMAL LOW (ref 98–111)
Creatinine, Ser: 0.67 mg/dL (ref 0.44–1.00)
GFR, Estimated: >60 mL/min (ref >60)
Glucose, Bld: 237 mg/dL — ABNORMAL HIGH (ref 70–99)
Potassium: 3.9 mmol/L (ref 3.5–5.1)
Sodium: 136 mmol/L (ref 135–145)

## 2023-08-19 LAB — PHOSPHORUS: Phosphorus: 3.7 mg/dL (ref 2.5–4.6)

## 2023-08-19 LAB — MAGNESIUM: Magnesium: 2 mg/dL (ref 1.7–2.4)

## 2023-08-19 MED ORDER — INSULIN GLARGINE-YFGN 100 UNIT/ML ~~LOC~~ SOLN
12.0000 [IU] | Freq: Every day | SUBCUTANEOUS | Status: DC
  Administered 2023-08-20 – 2023-08-22 (×3): 12 [IU] via SUBCUTANEOUS
  Filled 2023-08-19 (×3): qty 0.12

## 2023-08-19 MED ORDER — METOPROLOL TARTRATE 25 MG PO TABS
12.5000 mg | ORAL_TABLET | Freq: Two times a day (BID) | ORAL | Status: DC
  Administered 2023-08-19 – 2023-08-22 (×6): 12.5 mg via ORAL
  Filled 2023-08-19 (×6): qty 1

## 2023-08-19 NOTE — TOC Progression Note (Signed)
 Transition of Care [facility]) - Progression Note    Patient Details  Name: Jordan Chan MRN: [id number] Date of Birth: 1950/12/22  Transition of Care [facility]) CM/SW Contact  Odilia Bennett, LCSW Phone Number: 08/19/2023, 11:58 AM  Clinical Narrative:   PASARR obtained: [phone] A. CSW provided CMS scores with update on bed offers. Daughter asked that we have [facility] and [facility] and Rehab review the referral. CSW left messages for both admissions coordinators asking them to review.  Expected Discharge Plan: Skilled Nursing Facility Barriers to Discharge: Continued Medical Work up  Expected Discharge Plan and Services     Post Acute Care Choice:  (TBD) Living arrangements for the past 2 months: Single Family Home                                       Social Determinants of Health (SDOH) Interventions SDOH Screenings   Food Insecurity: No Food Insecurity (04/16/2023)  Housing: Low Risk  (04/16/2023)  Transportation Needs: No Transportation Needs (04/16/2023)  Utilities: Patient Unable To Answer (08/09/2023)  Alcohol  Screen: Low Risk  (12/23/2022)  Depression (PHQ2-9): Low Risk  (08/04/2023)  Recent Concern: Depression (PHQ2-9) - Medium Risk (07/27/2023)  Financial Resource Strain: Low Risk  (12/23/2022)  Physical Activity: Inactive (12/23/2022)  Social Connections: Patient Unable To Answer (08/09/2023)  Stress: No Stress Concern Present (12/23/2022)  Tobacco Use: Medium Risk (08/10/2023)    Readmission Risk Interventions    08/18/2023    2:50 PM  Readmission Risk Prevention Plan  Transportation Screening Complete  Medication Review (RN Care Manager) Complete  PCP or Specialist appointment within 3-5 days of discharge Complete  SW Recovery Care/Counseling Consult Complete  Palliative Care Screening Not Applicable  Skilled Nursing Facility Complete

## 2023-08-19 NOTE — Progress Notes (Signed)
 Progress Note   Patient: Jordan Chan WUX:[id number] DOB: 03-29-1951 DOA: 08/09/2023     10 DOS: the patient was seen and examined on 08/19/2023   Brief hospital course: 73 y.o female with significant PMH of B12 deficiency, chronic pain syndrome s/p intrathecal pump, paroxysmal atrial fibrillation, stress-induced cardiomyopathy in the setting of pain pump malfunction and opioid withdrawal, PE following hospitalization for pneumonia, DM, COPD, GERD, OSA, and IBS who presented to the ED with chief complaints of acute respiratory distress found with sats in the 50 to 60% on RA per EMS.  Respiratory failure, likely CHF and bilateral pneumonia. Extubated 08/15/23 and did very well.   Assessment and Plan: Acute on chronic respiratory failure with hypoxia and hypercapnia (HCC) secondary to Multiple subsegmental pulmonary emboli without acute cor pulmonale and Sepsis due to Aspiration pneumonia (HCC) Leukocytosis improving At home on 2LPM O2 [place] at night time. Currently requiring 2LPM O2 [place] at rest all day.  Aspiration pneumonia: completed abx course. Continue on bronchodilators & encourage incentive spirometry   Transfer to regular acute inpt bed status  Toxic metabolic encephalopathy: almost improved likely multifactorial- hospital/ICU delirium, narcotic use from chronic pain, infection. Much improved. Re-orient prn. Continue w/ supportive care    Diabetes mellitus: Mildly uncontrolled, increase Lantus  to 12 units daily, sliding scale insulin  as needed, Follow fingersticks  Generalized weakness: PT recs SNF    Chronic pain: continue on home dose of gabapentin    Circulatory shock: see Dr. Para Chan notes on how met circulatory shock criteria. Resolved   PAF: HR around 100 today  Continue eliquis .  Resume metoprolol  at 12.5mg  BID   Chronic systolic CHF: holding lasix . Monitor I/Os.  Continue losartan  low dose Restart metoprolol    Acute hypoxic respiratory failure: continue on  supplemental oxygen  and wean as tolerated   PE: d/ced IV heparin .  Cont eliquis     B/l pleural effusions: holding off on lasix .    AKI: resolved    Depression: severity unknown. Continue on home dose of buspar , venlafaxine         Subjective: Shortness of breath is improving Denies fever, chills, chest pain Denies headache, dizziness, abdominal symptoms  Physical Exam: Vitals:   08/19/23 0742 08/19/23 0800 08/19/23 0839 08/19/23 0849  BP:  (!) 140/65    Pulse:  93 93 90  Resp:  17 15 15   Temp:  97.7 F (36.5 C)    TempSrc:  Oral    SpO2: 96% 99% 98% 96%  Weight:      Height:      Physical Exam Constitutional:      General: She is not in acute distress.    Appearance: Normal appearance. She is ill-appearing.  HENT:     Head: Normocephalic and atraumatic.     Nose: Nose normal. No congestion.     Mouth/Throat:     Mouth: Mucous membranes are moist.     Pharynx: Oropharynx is clear. No oropharyngeal exudate.  Eyes:     Extraocular Movements: Extraocular movements intact.     Conjunctiva/sclera: Conjunctivae normal.  Cardiovascular:     Rate and Rhythm: Tachycardia present. Rhythm irregular.     Pulses: Normal pulses.     Heart sounds: Normal heart sounds.  Pulmonary:     Effort: Pulmonary effort is normal. No respiratory distress.     Breath sounds: Rhonchi present.  Abdominal:     General: Abdomen is flat. Bowel sounds are normal.     Palpations: Abdomen is soft.  Musculoskeletal:  General: No swelling or tenderness. Normal range of motion.     Cervical back: Normal range of motion and neck supple.  Skin:    General: Skin is warm.     Capillary Refill: Capillary refill takes 2 to 3 seconds.     Coloration: Skin is not jaundiced.     Findings: No erythema.  Neurological:     General: No focal deficit present.     Mental Status: She is oriented to person, place, and time. Mental status is at baseline.  Psychiatric:        Mood and Affect: Mood  normal.        Behavior: Behavior normal.        Thought Content: Thought content normal.      Data Reviewed:  Latest Reference Range & Units 08/19/23 10:14  Sodium 135 - 145 mmol/L 136  Potassium 3.5 - 5.1 mmol/L 3.9  Chloride 98 - 111 mmol/L 93 (L)  CO2 22 - 32 mmol/L 30  Glucose 70 - 99 mg/dL 960 (H)  BUN 8 - 23 mg/dL 12  Creatinine 4.54 - 0.98 mg/dL 1.19  Calcium  8.9 - 10.3 mg/dL 8.7 (L)  Anion gap 5 - 15  13  Phosphorus 2.5 - 4.6 mg/dL 3.7  Magnesium  1.7 - 2.4 mg/dL 2.0  GFR, Estimated >14 mL/min >60  WBC 4.0 - 10.5 K/uL 11.9 (H)  RBC 3.87 - 5.11 MIL/uL 3.20 (L)  Hemoglobin 12.0 - 15.0 g/dL 9.0 (L)  HCT 78.2 - 95.6 % 29.5 (L)  MCV 80.0 - 100.0 fL 92.2  MCH 26.0 - 34.0 pg 28.1  MCHC 30.0 - 36.0 g/dL 21.3  RDW 08.6 - 57.8 % 16.0 (H)  Platelets 150 - 400 K/uL 475 (H)  nRBC 0.0 - 0.2 % 0.0  (L): Data is abnormally low (H): Data is abnormally high  Family Communication: Updated family at bedside  Disposition: Status is: Inpatient--> change status from stepdown to acute inpatient Remains inpatient appropriate because: Oxygen  requirements, tachycardic still  Planned Discharge Destination: Skilled nursing facility    Time spent: 50 minutes  Author: Suzan Erm, MD 08/19/2023 9:52 AM  For on call review [email or web address].

## 2023-08-19 NOTE — Progress Notes (Addendum)
 Occupational Therapy Treatment Patient Details Name: Jordan Chan MRN: [id number] DOB: [birth year] Today's Date: 08/19/2023   History of present illness Pt is a 73 y/o F admitted on 08/09/23 after presenting with c/o respiratory distress, O2 sats in the 50s-60s on room air. Pt admitted to ICU for tx of acute hypoxic hypercapnic respiratory failure 2/2 suspected PNA & AECOPD, pt intubated but successfully extubated on 08/15/23. Pt also found to have acute PE during admission. PMH: B12 deficiency, chronic pain syndrome s/p intrathecal pump, paroxysmal a-fib, stress-induced cardiomyopathy, PE, DM, COPD on nocturnal O2, GERD, OSA, IBS, CVA   OT comments  Chart reviewed to date, pt greeted in room, agreeable to OT tx session targeting improving functional activity tolerance in preparation for ADL tasks. Pt is alert and oriented x4, improved participation, mobility, ADL performance noted from previous sessions. STS completed with CGA, amb in room approx 20' two attempts with RW with. LB dressing completed with MIN A in STS to donn/doff underwear. Pt is making progress towards goals, discussed rehab with pt/daugther, OT will continue to follow.       If plan is discharge home, recommend the following:  A lot of help with walking and/or transfers;A lot of help with bathing/dressing/bathroom   Equipment Recommendations  Other (comment) (defer)    Recommendations for Other Services      Precautions / Restrictions Precautions Precautions: Fall Restrictions Weight Bearing Restrictions Per Provider Order: No       Mobility Bed Mobility Overal bed mobility: Needs Assistance Bed Mobility: Supine to Sit, Sit to Supine     Supine to sit: Modified independent (Device/Increase time) Sit to supine: Min assist, HOB elevated        Transfers Overall transfer level: Needs assistance Equipment used: Rolling walker (2 wheels) Transfers: Sit to/from Stand Sit to Stand: Contact guard assist                  Balance Overall balance assessment: Needs assistance Sitting-balance support: Feet supported Sitting balance-Leahy Scale: Good     Standing balance support: Bilateral upper extremity supported, During functional activity, Reliant on assistive device for balance Standing balance-Leahy Scale: Fair                             ADL either performed or assessed with clinical judgement   ADL Overall ADL's : Needs assistance/impaired     Grooming: Wash/dry hands;Wash/dry face;Sitting;Supervision/safety               Lower Body Dressing: Minimal assistance;Sit to/from stand Lower Body Dressing Details (indicate cue type and reason): donn/doff underwear Toilet Transfer: Contact guard assist;Minimal assistance;Rolling walker (2 wheels) Toilet Transfer Details (indicate cue type and reason): simulated Toileting- Clothing Manipulation and Hygiene: Minimal assistance;Sit to/from stand Toileting - Clothing Manipulation Details (indicate cue type and reason): simulated     Functional mobility during ADLs: Contact guard assist;Minimal assistance;Rolling walker (2 wheels) (approx 10' 2 attempts with RW) General ADL Comments: frequent vcs for pacing    Extremity/Trunk Assessment         Cervical / Trunk Assessment Cervical / Trunk Assessment: Other exceptions Cervical / Trunk Exceptions: baseline cervical lateral rotation to the left, scoliosis noted; pt reports she has a torticollis that has been worsening over the past 10 years. Able to bring head against gravity into neutral but not her preferred position    Vision       Perception     Praxis  Cognition Arousal: Alert Behavior During Therapy: WFL for tasks assessed/performed Overall Cognitive Status: Within Functional Limits for tasks assessed                                          Exercises Other Exercises Other Exercises: edu re: role of OT, role of rehab, discharge  recommendations    Shoulder Instructions       General Comments spo2 to 86-87 on 4L via [place] during mobility, quickly up to >90% on 4L vIa [place] with PLB and rest break; blister noted on anterior L thigh, nurse notified    Pertinent Vitals/ Pain       Pain Assessment Pain Assessment: No/denies pain  Home Living                                          Prior Functioning/Environment              Frequency  Min 1X/week        Progress Toward Goals  OT Goals(current goals can now be found in the care plan section)  Progress towards OT goals: Progressing toward goals  Acute Rehab OT Goals Time For Goal Achievement: 08/30/23 ADL Goals Pt Will Perform Grooming: with modified independence;sitting Pt Will Perform Lower Body Dressing: with modified independence;sit to/from stand Pt Will Transfer to Toilet: with modified independence;ambulating  Plan      Co-evaluation    PT/OT/SLP Co-Evaluation/Treatment: Yes Reason for Co-Treatment: To address functional/ADL transfers;For patient/therapist safety PT goals addressed during session: Mobility/safety with mobility;Balance;Proper use of DME OT goals addressed during session: ADL's and self-care      AM-PAC OT "6 Clicks" Daily Activity     Outcome Measure   Help from another person eating meals?: A Little Help from another person taking care of personal grooming?: A Little Help from another person toileting, which includes using toliet, bedpan, or urinal?: A Little Help from another person bathing (including washing, rinsing, drying)?: A Little Help from another person to put on and taking off regular upper body clothing?: A Little Help from another person to put on and taking off regular lower body clothing?: A Little 6 Click Score: 18    End of Session Equipment Utilized During Treatment: Rolling walker (2 wheels);Oxygen   OT Visit Diagnosis: Unsteadiness on feet (R26.81);Other abnormalities of gait  and mobility (R26.89)   Activity Tolerance Patient tolerated treatment well   Patient Left in bed;with call bell/phone within reach;with family/visitor present   Nurse Communication Mobility status blister on L thigh         Time: 1421-1447 OT Time Calculation (min): 26 min  Charges: OT General Charges $OT Visit: 1 Visit OT Treatments $Self Care/Home Management : 8-22 mins  Gerre Kraft, OTD OTR/L  08/19/23, 3:57 PM

## 2023-08-19 NOTE — Progress Notes (Signed)
 Physical Therapy Treatment Patient Details Name: Jordan Chan MRN: [id number] DOB: 1950/10/06 Today's Date: 08/19/2023   History of Present Illness Pt is a 73 y/o F admitted on 08/09/23 after presenting with c/o respiratory distress, O2 sats in the 50s-60s on room air. Pt admitted to ICU for tx of acute hypoxic hypercapnic respiratory failure 2/2 suspected PNA & AECOPD, pt intubated but successfully extubated on 08/15/23. Pt also found to have acute PE during admission. PMH: B12 deficiency, chronic pain syndrome s/p intrathecal pump, paroxysmal a-fib, stress-induced cardiomyopathy, PE, DM, COPD on nocturnal O2, GERD, OSA, IBS, CVA    PT Comments  Patient received in bed, daughter is present in room. She is agreeable to PT session. Patient reports she is feeling better. Sat in recliner and has been up to [facility]. Patient requires min A for bed mobility and cga for sit to stand. She is able to ambulated 20 feet x 2 with seated rest between bouts due to fatigue and SOB. O2 sats down to 87% with mobility. Patient will continue to benefit from skilled PT to improve endurance, strength and safety with mobility.     If plan is discharge home, recommend the following: A little help with walking and/or transfers;A little help with bathing/dressing/bathroom;Assist for transportation   Can travel by private vehicle     Yes  Equipment Recommendations  None recommended by PT    Recommendations for Other Services       Precautions / Restrictions Precautions Precautions: Fall Restrictions Weight Bearing Restrictions Per Provider Order: No     Mobility  Bed Mobility Overal bed mobility: Needs Assistance Bed Mobility: Supine to Sit, Sit to Supine     Supine to sit: Min assist, HOB elevated, Used rails Sit to supine: Min assist, HOB elevated   General bed mobility comments: Patient with much improved mobility this session    Transfers Overall transfer level: Needs assistance Equipment used:  Rolling walker (2 wheels) Transfers: Sit to/from Stand Sit to Stand: Contact guard assist                Ambulation/Gait Ambulation/Gait assistance: Contact guard assist Gait Distance (Feet): 40 Feet Assistive device: Rolling walker (2 wheels) Gait Pattern/deviations: Step-through pattern, Decreased step length - right, Decreased step length - left, Decreased stride length, Trunk flexed Gait velocity: decreased     General Gait Details: patient ambulated ~ 20 feet x 2 reps with seated rest between due to SOB   Stairs             Wheelchair Mobility     Tilt Bed    Modified Rankin (Stroke Patients Only)       Balance Overall balance assessment: Needs assistance Sitting-balance support: Feet supported Sitting balance-Leahy Scale: Good     Standing balance support: Bilateral upper extremity supported, During functional activity, Reliant on assistive device for balance Standing balance-Leahy Scale: Good Standing balance comment: she is able to static stand without UE support                            Cognition Arousal: Alert Behavior During Therapy: WFL for tasks assessed/performed Overall Cognitive Status: Within Functional Limits for tasks assessed                                          Exercises  General Comments        Pertinent Vitals/Pain Pain Assessment Pain Assessment: No/denies pain    Home Living                          Prior Function            PT Goals (current goals can now be found in the care plan section) Acute Rehab PT Goals Patient Stated Goal: get better PT Goal Formulation: With patient/family Time For Goal Achievement: 08/30/23 Potential to Achieve Goals: Fair Progress towards PT goals: Progressing toward goals    Frequency    Min 1X/week      PT Plan      Co-evaluation PT/OT/SLP Co-Evaluation/Treatment: Yes Reason for Co-Treatment: To address functional/ADL  transfers;For patient/therapist safety PT goals addressed during session: Mobility/safety with mobility;Balance;Proper use of DME        AM-PAC PT "6 Clicks" Mobility   Outcome Measure  Help needed turning from your back to your side while in a flat bed without using bedrails?: A Little Help needed moving from lying on your back to sitting on the side of a flat bed without using bedrails?: A Little Help needed moving to and from a bed to a chair (including a wheelchair)?: A Little Help needed standing up from a chair using your arms (e.g., wheelchair or bedside chair)?: A Little Help needed to walk in hospital room?: A Little Help needed climbing 3-5 steps with a railing? : A Lot 6 Click Score: 17    End of Session Equipment Utilized During Treatment: Oxygen  Activity Tolerance: Patient tolerated treatment well;Patient limited by fatigue Patient left: in bed;with call bell/phone within reach;with family/visitor present Nurse Communication: Mobility status PT Visit Diagnosis: Muscle weakness (generalized) (M62.81);Difficulty in walking, not elsewhere classified (R26.2)     Time: 0102-7253 PT Time Calculation (min) (ACUTE ONLY): 25 min  Charges:    $Gait Training: 8-22 mins PT General Charges $$ ACUTE PT VISIT: 1 Visit                     Windie Marasco, PT, GCS 08/19/23,3:00 PM

## 2023-08-19 NOTE — Progress Notes (Signed)
 Patient transferred to room 211 by wheelchair with Maddy, NT and Brenita Callow, RN. Patient's family at side when leaving ICU.

## 2023-08-19 NOTE — Plan of Care (Signed)
  Problem: Education: Goal: Knowledge of General Education information will improve Description: Including pain rating scale, medication(s)/side effects and non-pharmacologic comfort measures Outcome: Progressing   Problem: Clinical Measurements: Goal: Ability to maintain clinical measurements within normal limits will improve Outcome: Progressing Goal: Will remain free from infection Outcome: Progressing Goal: Diagnostic test results will improve Outcome: Progressing   Problem: Nutrition: Goal: Adequate nutrition will be maintained Outcome: Progressing   Problem: Coping: Goal: Level of anxiety will decrease Outcome: Progressing   Problem: Elimination: Goal: Will not experience complications related to urinary retention Outcome: Progressing

## 2023-08-20 ENCOUNTER — Encounter: Payer: Self-pay | Admitting: Pulmonary Disease

## 2023-08-20 DIAGNOSIS — J9621 Acute and chronic respiratory failure with hypoxia: Secondary | ICD-10-CM | POA: Diagnosis not present

## 2023-08-20 DIAGNOSIS — J9622 Acute and chronic respiratory failure with hypercapnia: Secondary | ICD-10-CM | POA: Diagnosis not present

## 2023-08-20 LAB — GLUCOSE, CAPILLARY
Glucose-Capillary: 133 mg/dL — ABNORMAL HIGH (ref 70–99)
Glucose-Capillary: 177 mg/dL — ABNORMAL HIGH (ref 70–99)
Glucose-Capillary: 133 mg/dL — ABNORMAL HIGH (ref 70–99)
Glucose-Capillary: 105 mg/dL — ABNORMAL HIGH (ref 70–99)

## 2023-08-20 MED ORDER — FUROSEMIDE 20 MG PO TABS
20.0000 mg | ORAL_TABLET | Freq: Every day | ORAL | Status: DC
  Administered 2023-08-20 – 2023-08-22 (×3): 20 mg via ORAL
  Filled 2023-08-20 (×3): qty 1

## 2023-08-20 NOTE — Progress Notes (Signed)
Heart Failure Stewardship Pharmacy Note  PCP: Marjie Skiff, NP PCP-Cardiologist: Yvonne Kendall, MD  HPI: Jordan Chan is a 73 y.o. female with B12 deficiency, chronic pain syndrome s/p intrathecal pump, paroxysmal atrial fibrillation, stress-induced cardiomyopathy in the setting of pain pump malfunction and opioid withdrawal, PE following hospitalization for pneumonia, DM, COPD on 2L O2 overnight, GERD, OSA, and IBS  who presented with acute respiratory distress. Lactic acid on admission was >9. BNP was 241.9. HS-troponin was 20. Beta-hydroxybutyric acid was 1.39. Suspected septic shock due to pneumonia with acute on chronic CHF. Also found to have an acute PE. Required intubation. After 1 failed extubation, was successfully extubated on 08/15/23.   Pertinent cardiac history: Echo in 04/2017 with LVEF of 25-30% and hypokinesis of mid-apical myocardium. Given high stress from pain, LHC revealing no obstructive disease and significant apical akinesis, patient was diagnosed with Takotsubo cardiomyopathy. In 08/2017, echo showed LVEF improved to 50-55%. In 07/2020, LVEF declines mildly to 45-50%. LVEF in 12/2020 unchanged and noted grade II diastolic dysfunction. After experiencing atrial fibrillation, echo in 09/2021 showed LVEF downt o 25-30% with moderately reduced RV function. Stress test in 11/2021 showed no significant ischemia and LVEF of 67%. Most recent echo this admission showed LVEF back down to 25-30% with grade I diastolic function and low-normal RV function.   Pertinent Lab Values: Creatinine  Date Value Ref Range Status  09/14/2013 0.68 0.60 - 1.30 mg/dL Final   Creatinine, Ser  Date Value Ref Range Status  08/19/2023 0.67 0.44 - 1.00 mg/dL Final   BUN  Date Value Ref Range Status  08/19/2023 12 8 - 23 mg/dL Final  78/29/5621 21 8 - 27 mg/dL Final  30/86/5784 8 7 - 18 mg/dL Final   Potassium  Date Value Ref Range Status  08/19/2023 3.9 3.5 - 5.1 mmol/L Final   09/14/2013 3.5 3.5 - 5.1 mmol/L Final   Sodium  Date Value Ref Range Status  08/19/2023 136 135 - 145 mmol/L Final  06/17/2023 138 134 - 144 mmol/L Final  09/14/2013 133 (L) 136 - 145 mmol/L Final   B Natriuretic Peptide  Date Value Ref Range Status  08/09/2023 241.9 (H) 0.0 - 100.0 pg/mL Final    Comment:    Performed at [facility], [address] [place], [place] [address]   Magnesium  Date Value Ref Range Status  08/19/2023 2.0 1.7 - 2.4 mg/dL Final    Comment:    Performed at [facility], [address] [place], [place] [address]   Hemoglobin A1C  Date Value Ref Range Status  04/28/2016 7.7%  Final   HB A1C (BAYER DCA - WAIVED)  Date Value Ref Range Status  06/12/2023 7.4 (H) 4.8 - 5.6 % Final    Comment:             Prediabetes: 5.7 - 6.4          Diabetes: >6.4          Glycemic control for adults with diabetes: <7.0    Digoxin Level  Date Value Ref Range Status  09/13/2021 0.6 (L) 0.8 - 2.0 ng/mL Final    Comment:    Performed at [facility], [address] [place], [place] [address]   TSH  Date Value Ref Range Status  08/09/2023 3.907 0.350 - 4.500 uIU/mL Final    Comment:    Performed by a 3rd Generation assay with a functional sensitivity of <=0.01 uIU/mL. Performed at [facility], [phone]  [address] [place], [place] [address]   09/24/2022 3.850 0.450 - 4.500 uIU/mL Final   LDH  Date Value Ref Range Status  12/18/2019 122 98 - 192 U/L Final    Comment:    Performed at [facility], [address] [place], [place] [address]    Vital Signs: Admission weight: 149.9 lbs Temp:  [97.7 F (36.5 C)-98.7 F (37.1 C)] 97.9 F (36.6 C) (01/16 0327) Pulse Rate:  [90-99] 92 (01/16 0327) Cardiac Rhythm: Normal sinus rhythm (01/15 1904) Resp:  [15-20] 20 (01/16 0327) BP: (126-140)/(53-66) 127/53 (01/16 0327) SpO2:  [96 %-99 %] 96 % (01/16 0327) Weight:  [71.1 kg (156 lb 12 oz)] 71.1 kg (156 lb 12  oz) (01/16 0441)  Intake/Output Summary (Last 24 hours) at 08/20/2023 2952 Last data filed at 08/19/2023 2100 Gross per 24 hour  Intake 360 ml  Output --  Net 360 ml   Current Heart Failure Medications:  Loop diuretic: none Beta-Blocker: metoprolol tartrate 12.5 mg BID ACEI/ARB/ARNI: losartan 12.5 mg daily MRA: none SGLT2i: none Other: none  Prior to admission Heart Failure Medications:  Loop diuretic: furosemide 20 mg daily Beta-Blocker: metoprolol succinate 12.5 mg daily ACEI/ARB/ARNI: losartan 12.5 mg daily MRA: none  SGLT2i: none Other: none  Assessment: 1. Acute on chronic combined systolic and diastolic heart failure (LVEF 25-30%) with grade I diastolic dysfunction, due to NICM. NYHA class IV symptoms.  -Symptoms: Patient reports shortness of breath is worsening requiring re initiation of daytime O2. She also experienced orthopnea overnight. Feels very fatigued sitting in bed. -Volume: Patient appears mildly hypervolemic today. No LEE noted. JVP mildly elevated. Positive for orthopnea. Weight is up 16 lbs from admission, though suspect patient was dehydrated on admission. -Hemodynamics: BP elevated from 130-140s/60s. HR stable in 90s. -BB: Patient is on metoprolol tartrate 12.5 mg BID. Will need to consolidate to metoprolol succinate prior to discharge.  -ACEI/ARB/ARNI: Currently on losartan 12.5 mg daily. Given BP up, can likely increase. -MRA: Patient would benefit from the addition of spironolactone. K 3.9. -SGLT2i: Not a great candidate for SGLT2i given intrathecal pump and probably incontinence due to poor mobility.  Plan: 1) Medication changes recommended at this time: -Consider starting spironolactone 12.5 mg daily. This will add HF GDMT and give gentle diuresis.  -Can consider resuming oral furosemide given symptoms of orthopnea and rebound O2 requirement.  2) Patient assistance: -Pending  3) Education: - Patient has been educated on current HF medications and  potential additions to HF medication regimen - Patient verbalizes understanding that over the next few months, these medication doses may change and more medications may be added to optimize HF regimen - Patient has been educated on basic disease state pathophysiology and goals of therapy  Medication Assistance / Insurance Benefits Check: Does the patient have prescription insurance?    Type of insurance plan:  Does the patient qualify for medication assistance through manufacturers or grants? Pending   Outpatient Pharmacy: Prior to admission outpatient pharmacy: [facility]     Please do not hesitate to reach out with questions or concerns,  Enos Fling, PharmD, CPP, BCPS Heart Failure Pharmacist  Phone - [phone] 08/20/2023 10:11 AM

## 2023-08-20 NOTE — Progress Notes (Signed)
Heart Failure Nurse Navigator Progress Note  PCP: Marjie Skiff, NP PCP-Cardiologist: Yvonne Kendall, MD  Admission Diagnosis:  Acute respiratory failure with hypoxia and hypercapnia (HCC) Sepsis due to pneumonia  Admitted from: Home  Presentation:   Jordan Chan presented in respiratory distress. Was found unresponsive at home and bagged by [facility].  Patient sats 50-60's on room air. BNP 241.9.  ECHO/ LVEF: 25-30%  Clinical Course:  Past Medical History:  Diagnosis Date   Arthritis    Asthma    Atrial fibrillation (HCC)    Breast cancer (HCC) 1998   right breast ca with mastectomy and chemotherapy and radiation   Bronchitis    CHF (congestive heart failure) (HCC)    "with Morphine withdrawal"   COPD (chronic obstructive pulmonary disease) (HCC)    Diabetes mellitus without complication (HCC)    Diverticulitis    diverticulosis also   Dyspnea    Endometriosis    GERD (gastroesophageal reflux disease)    History of shingles 2000-2005   Hypercholesteremia    Hypertension    IBS (irritable bowel syndrome)    Low back pain    a. Implanted morphine/bupivicaine/clonidine pump.   Neuropathy    Orthopnea    Oxygen dependent    uses at night   Personal history of chemotherapy    Personal history of radiation therapy    Pneumonia    pneumonia 5-6 times, history of bronchitis also   Scoliosis    Sleep apnea    does not use cpap   Stroke (HCC) 2010   TIA, 10 years ago   Withdrawal from sedative drug (HCC)    withdrawal from morphine when pump batteries died     Social History   Socioeconomic History   Marital status: Unknown    Spouse name: Harvie Heck   Number of children: 2   Years of education: Not on file   Highest education level: High school graduate  Occupational History   Occupation: retired  Tobacco Use   Smoking status: Former    Current packs/day: 0.00    Average packs/day: 1 pack/day for 30.0 years (30.0 ttl pk-yrs)    Types: Cigarettes     Start date: 04/30/1974    Quit date: 04/30/2004    Years since quitting: 19.3   Smokeless tobacco: Never  Vaping Use   Vaping status: Never Used  Substance and Sexual Activity   Alcohol use: No   Drug use: No   Sexual activity: Not Currently    Birth control/protection: Post-menopausal  Other Topics Concern   Not on file  Social History Narrative   ** Merged History Encounter **       Social Drivers of Health   Financial Resource Strain: Low Risk  (08/20/2023)   Overall Financial Resource Strain (CARDIA)    Difficulty of Paying Living Expenses: Not hard at all  Food Insecurity: No Food Insecurity (04/16/2023)   Hunger Vital Sign    Worried About Running Out of Food in the Last Year: Never true    Ran Out of Food in the Last Year: Never true  Transportation Needs: No Transportation Needs (08/20/2023)   PRAPARE - Administrator, Civil Service (Medical): No    Lack of Transportation (Non-Medical): No  Physical Activity: Inactive (12/23/2022)   Exercise Vital Sign    Days of Exercise per Week: 0 days    Minutes of Exercise per Session: 0 min  Stress: No Stress Concern Present (12/23/2022)   [employer] of  Occupational Health - Occupational Stress Questionnaire    Feeling of Stress : Only a little  Social Connections: Patient Unable To Answer (08/09/2023)   Social Connection and Isolation Panel [NHANES]    Frequency of Communication with Friends and Family: Patient unable to answer    Frequency of Social Gatherings with Friends and Family: Patient unable to answer    Attends Religious Services: Patient unable to answer    Active Member of Clubs or Organizations: Patient unable to answer    Attends Banker Meetings: Patient unable to answer    Marital Status: Patient unable to answer   Education Assessment and Provision:  Detailed education and instructions provided on heart failure disease management including the following:  Signs and symptoms of  Heart Failure When to call the physician Importance of daily weights Low sodium diet Fluid restriction Medication management Anticipated future follow-up appointments  Patient education given on each of the above topics.  Patient acknowledges understanding via teach back method and acceptance of all instructions.  Education Materials:  "Living Better With Heart Failure" Booklet, HF zone tool, & Daily Weight Tracker Tool.  Patient has scale at home: Yes Patient has pill box at home: Yes    High Risk Criteria for Readmission and/or Poor Patient Outcomes: Heart failure hospital admissions (last 6 months): 0  No Show rate: 4 Difficult social situation: None Demonstrates medication adherence: Yes Primary Language: English Literacy level: Reading, Writing, & Comprehension  Barriers of Care:   Diet & Fluid Restrictions Daily Weights Medication Compliance  Considerations/Referrals:  Referral made to Heart Failure Pharmacist Stewardship: Yes Referral made to Heart Failure CSW/NCM TOC: No Referral made to Heart & Vascular TOC clinic: Yes-08/26/23 @ 9:00 AM  Items for Follow-up on DC/TOC: Diet & Fluid Restrictions Daily Weights Medication Compliance Continued Heart Failure Education  Roxy Horseman, RN, BSN [facility] Heart Failure Navigator Secure Chat Only

## 2023-08-20 NOTE — Progress Notes (Addendum)
Progress Note   Patient: Jordan Chan [id number] DOB: 10/22/1950 DOA: 08/09/2023     11 DOS: the patient was seen and examined on 08/20/2023   Brief hospital course: 73 y.o female with significant PMH of B12 deficiency, chronic pain syndrome s/p intrathecal pump, paroxysmal atrial fibrillation, stress-induced cardiomyopathy in the setting of pain pump malfunction and opioid withdrawal, PE following hospitalization for pneumonia, DM, COPD, GERD, OSA, and IBS who presented to the ED with chief complaints of acute respiratory distress found with sats in the 50 to 60% on RA per EMS.  Respiratory failure, likely CHF and bilateral pneumonia. Extubated 08/15/23 and did very well.   Assessment and Plan: Acute on chronic respiratory failure with hypoxia and hypercapnia (HCC) secondary to Multiple subsegmental pulmonary emboli without acute cor pulmonale and Sepsis due to Aspiration pneumonia (HCC) Leukocytosis improving At home on 2LPM O2 [place] at night time. Currently requiring 2LPM O2 [place] at rest all day.  Aspiration pneumonia: completed abx course. Continue on bronchodilators & encourage incentive spirometry   Underwent swallow eval--> Regular;Thin liquid (with cut meats) -> diet orders changed Perform IS Q4hrs while awake  Toxic metabolic encephalopathy: almost improved likely multifactorial- hospital/ICU delirium, narcotic use from chronic pain, infection. Much improved. Re-orient prn. Continue w/ supportive care    Diabetes mellitus: Mildly uncontrolled, Cont Lantus 12 units daily, sliding scale insulin as needed, Follow fingersticks  Generalized weakness: PT recs SNF    Chronic pain: continue on home dose of gabapentin   Circulatory shock: see Dr. Doreene Adas notes on how met circulatory shock criteria. Resolved   PAF: HR in 90s Continue eliquis.  Cont metoprolol at 12.5mg  BID   Chronic systolic CHF: resume lasix 20mg  daily  Monitor I/Os.  Continue losartan low dose Continue  metoprolol   Acute hypoxic respiratory failure: continue on supplemental oxygen and wean as tolerated   PE: d/ced IV heparin.  Cont eliquis    B/l pleural effusions: resume lasix 20mg  daily    AKI: resolved    Depression: severity unknown. Continue on home dose of buspar, venlafaxine        Subjective: Shortness of breath is improving Denies fever, chills, chest pain Denies headache, dizziness, abdominal symptoms  Physical Exam: Vitals:   08/20/23 0441 08/20/23 0746 08/20/23 0827 08/20/23 1319  BP:   130/65   Pulse:  96 97 96  Resp:  20 18 18   Temp:   98 F (36.7 C)   TempSrc:      SpO2:  96% 93% 93%  Weight: 71.1 kg     Height:      Physical Exam Constitutional:      General: She is not in acute distress.    Appearance: Normal appearance.  HENT:     Head: Normocephalic and atraumatic.     Nose: Nose normal. No congestion.     Mouth/Throat:     Mouth: Mucous membranes are moist.     Pharynx: Oropharynx is clear. No oropharyngeal exudate.  Eyes:     Extraocular Movements: Extraocular movements intact.     Conjunctiva/sclera: Conjunctivae normal.  Cardiovascular:     Rate and Rhythm: Normal rate. Rhythm irregular.     Pulses: Normal pulses.     Heart sounds: Normal heart sounds.  Pulmonary:     Effort: Pulmonary effort is normal. No respiratory distress.     Breath sounds: Rhonchi and rales present.  Abdominal:     General: Abdomen is flat. Bowel sounds are normal.     Palpations:  Abdomen is soft.  Musculoskeletal:        General: No swelling or tenderness. Normal range of motion.     Cervical back: Normal range of motion and neck supple.  Skin:    General: Skin is warm.     Capillary Refill: Capillary refill takes 2 to 3 seconds.     Coloration: Skin is not jaundiced.     Findings: No erythema.  Neurological:     General: No focal deficit present.     Mental Status: She is oriented to person, place, and time. Mental status is at baseline.   Psychiatric:        Mood and Affect: Mood normal.        Behavior: Behavior normal.        Thought Content: Thought content normal.      Data Reviewed:  Latest Reference Range & Units 08/19/23 10:14  Sodium 135 - 145 mmol/L 136  Potassium 3.5 - 5.1 mmol/L 3.9  Chloride 98 - 111 mmol/L 93 (L)  CO2 22 - 32 mmol/L 30  Glucose 70 - 99 mg/dL 191 (H)  BUN 8 - 23 mg/dL 12  Creatinine 4.78 - 2.95 mg/dL 6.21  Calcium 8.9 - 30.8 mg/dL 8.7 (L)  Anion gap 5 - 15  13  Phosphorus 2.5 - 4.6 mg/dL 3.7  Magnesium 1.7 - 2.4 mg/dL 2.0  GFR, Estimated >65 mL/min >60  WBC 4.0 - 10.5 K/uL 11.9 (H)  RBC 3.87 - 5.11 MIL/uL 3.20 (L)  Hemoglobin 12.0 - 15.0 g/dL 9.0 (L)  HCT 78.4 - 69.6 % 29.5 (L)  MCV 80.0 - 100.0 fL 92.2  MCH 26.0 - 34.0 pg 28.1  MCHC 30.0 - 36.0 g/dL 29.5  RDW 28.4 - 13.2 % 16.0 (H)  Platelets 150 - 400 K/uL 475 (H)  nRBC 0.0 - 0.2 % 0.0  (L): Data is abnormally low (H): Data is abnormally high  Family Communication: Updated family at bedside  Disposition: Status is: Inpatient--> change status from stepdown to acute inpatient Remains inpatient appropriate because: Oxygen requirements, tachycardic still  Planned Discharge Destination: Skilled nursing facility    Time spent: 35 minutes  Author: Ernestene Mention, MD 08/20/2023 2:18 PM  For on call review [email or web address].

## 2023-08-20 NOTE — Care Management Important Message (Signed)
Important Message  Patient Details  Name: Jordan Chan MRN: [id number] Date of Birth: Apr 19, 1951   Important Message Given:  Yes - Medicare IM     Sherilyn Banker 08/20/2023, 12:15 PM

## 2023-08-20 NOTE — TOC Progression Note (Signed)
Transition of Care [facility]) - Progression Note    Patient Details  Name: Jordan Chan MRN: [id number] Date of Birth: 11-24-1950  Transition of Care [facility]) CM/SW Contact  Chapman Fitch, RN Phone Number: 08/20/2023, 3:58 PM  Clinical Narrative:     Met with patient, spouse, and daughter patricia at bedside They accepted bed at [facility] and notified Tammy at [facility] with TOC to start auth   Expected Discharge Plan: Skilled Nursing Facility Barriers to Discharge: Continued Medical Work up  Expected Discharge Plan and Services     Post Acute Care Choice:  (TBD) Living arrangements for the past 2 months: Single Family Home                                       Social Determinants of Health (SDOH) Interventions SDOH Screenings   Food Insecurity: No Food Insecurity (04/16/2023)  Housing: Unknown (08/20/2023)  Transportation Needs: No Transportation Needs (08/20/2023)  Utilities: Patient Unable To Answer (08/09/2023)  Alcohol Screen: Low Risk  (08/20/2023)  Depression (PHQ2-9): Low Risk  (08/04/2023)  Recent Concern: Depression (PHQ2-9) - Medium Risk (07/27/2023)  Financial Resource Strain: Low Risk  (08/20/2023)  Physical Activity: Inactive (12/23/2022)  Social Connections: Patient Unable To Answer (08/09/2023)  Stress: No Stress Concern Present (12/23/2022)  Tobacco Use: Medium Risk (08/20/2023)    Readmission Risk Interventions    08/18/2023    2:50 PM  Readmission Risk Prevention Plan  Transportation Screening Complete  Medication Review (RN Care Manager) Complete  PCP or Specialist appointment within 3-5 days of discharge Complete  SW Recovery Care/Counseling Consult Complete  Palliative Care Screening Not Applicable  Skilled Nursing Facility Complete

## 2023-08-20 NOTE — Progress Notes (Signed)
Speech Language Pathology Treatment: Dysphagia  Patient Details Name: Jordan Chan MRN: [id number] DOB: [birth year] Today's Date: 08/20/2023 Time: 6010-9323 SLP Time Calculation (min) (ACUTE ONLY): 20 min  Assessment / Plan / Recommendation Clinical Impression  Pt seen for follow up dysphagia intervention. Nasal canula in place with 2L O2 and O2 sats maintained at 93-95 for duration of session. Trials completed with regular solids and thin liquids. No overt or subtle s/sx pharyngeal dysphagia noted. No change to vocal quality across trials. Vitals stable for duration of trials. Minimally extended time for mastication and clearance with regular solids. Liquid wash utilized for oral clearance. Pt endorsing increased endurance, some appetite for intake, and desire for increased variety in menu options. Daughter reports continued challenge with mastication and clearance of meats, but otherwise deny issue with current PO intake. Education shared regarding selection of soft solids from regular menu items to expand options. Recommend regular solids (cut meats) and thin liquids with aspiration precautions (slow rate, small bites, elevated HOB, and alert for PO intake). Pt and daughter reported understanding regarding plan. MD and RN made aware of diet recommendations.    HPI HPI: Pt is a 73 y/o F admitted on 08/09/23 after presenting with c/o respiratory distress, O2 sats in the 50s-60s on room air. Pt admitted to ICU for tx of acute hypoxic hypercapnic respiratory failure 2/2 suspected AECOPD, pna.  Pt intubated at admit and successfully extubated on 08/15/23. Pt also found to have acute PE during admission. PMH: B12 deficiency, chronic pain syndrome on intrathecal pump, opiod use, obesity, paroxysmal a-fib, stress-induced cardiomyopathy, PE, DM, COPD on nocturnal O2, GERD, OSA, IBS, CVA.  No reported difficulty swallowing at home; wears Dentures.   Imaging: Lungs/Pleura: Extensive multifocal airspace  consolidation noted  throughout the lungs bilaterally, predominantly in the dependent  portion of the lungs   CXR on 07/23/2023: No active cardiopulmonary disease.      SLP Plan  Continue with current plan of care      Recommendations for follow up therapy are one component of a multi-disciplinary discharge planning process, led by the attending physician.  Recommendations may be updated based on patient status, additional functional criteria and insurance authorization.    Recommendations  Diet recommendations: Regular;Thin liquid (with cut meats) Liquids provided via: Cup;Straw Medication Administration: Whole meds with liquid Supervision: Staff to assist with self feeding Compensations: Minimize environmental distractions;Slow rate;Small sips/bites Postural Changes and/or Swallow Maneuvers: Seated upright 90 degrees;Upright 30-60 min after meal                  Oral care BID (denture care)     Dysphagia, unspecified (R13.10)     Continue with current plan of care    Jordan [name]  MS [facility] SLP  Jordan Chan  08/20/2023, 10:51 AM

## 2023-08-20 NOTE — Progress Notes (Signed)
Physical Therapy Treatment Patient Details Name: Jordan Chan MRN: [id number] DOB: [birth year] Today's Date: 08/20/2023   History of Present Illness Pt is a 73 y/o F admitted on 08/09/23 after presenting with c/o respiratory distress, O2 sats in the 50s-60s on room air. Pt admitted to ICU for tx of acute hypoxic hypercapnic respiratory failure 2/2 suspected PNA & AECOPD, pt intubated but successfully extubated on 08/15/23. Pt also found to have acute PE during admission. PMH: B12 deficiency, chronic pain syndrome s/p intrathecal pump, paroxysmal a-fib, stress-induced cardiomyopathy, PE, DM, COPD on nocturnal O2, GERD, OSA, IBS, CVA    PT Comments  Pt seen for PT tx with pt agreeable, daughter present for session. Pt is able to ambulate in room with RW & CGA. Pt requesting to brush teeth & stands at sink with close supervision except pt does experience 1 LOB posteriorly, requiring MAX assist to prevent fall. Pt does note fatigue after standing at sink ~5 minutes. Pt continues to demonstrate downward head, unable to hold head upright. Will continue to follow pt acutely to address balance, endurance, strengthening, to increase independence with mobility.    If plan is discharge home, recommend the following: A little help with walking and/or transfers;A little help with bathing/dressing/bathroom;Assist for transportation   Can travel by private vehicle     Yes  Equipment Recommendations  None recommended by PT    Recommendations for Other Services       Precautions / Restrictions Precautions Precautions: Fall Restrictions Weight Bearing Restrictions Per Provider Order: No     Mobility  Bed Mobility Overal bed mobility: Needs Assistance Bed Mobility: Sidelying to Sit   Sidelying to sit: Supervision, HOB elevated, Used rails            Transfers Overall transfer level: Needs assistance Equipment used: Rolling walker (2 wheels) Transfers: Sit to/from Stand Sit to Stand:  Contact guard assist           General transfer comment: STS from EOB, pt pulls to standing with BUE on RW vs pushing to standing    Ambulation/Gait Ambulation/Gait assistance: Contact guard assist Gait Distance (Feet): 15 Feet (+ 15 ft) Assistive device: Rolling walker (2 wheels) Gait Pattern/deviations: Step-through pattern, Decreased step length - left, Decreased stride length, Decreased step length - right, Trunk flexed Gait velocity: decreased     General Gait Details: ambulated bed>sink>recliner   Stairs             Wheelchair Mobility     Tilt Bed    Modified Rankin (Stroke Patients Only)       Balance Overall balance assessment: Needs assistance Sitting-balance support: Feet supported Sitting balance-Leahy Scale: Good     Standing balance support: Bilateral upper extremity supported, During functional activity, Reliant on assistive device for balance Standing balance-Leahy Scale: Poor                              Cognition Arousal: Alert Behavior During Therapy: WFL for tasks assessed/performed                                   General Comments: decreased safety awareness, laughing after LOB & PT preventing fall        Exercises      General Comments General comments (skin integrity, edema, etc.): Max HR 132 bpm, SpO2 >/= 90% on supplemental O2  Pertinent Vitals/Pain Pain Assessment Pain Assessment: No/denies pain    Home Living                          Prior Function            PT Goals (current goals can now be found in the care plan section) Acute Rehab PT Goals Patient Stated Goal: get better PT Goal Formulation: With patient/family Time For Goal Achievement: 08/30/23 Potential to Achieve Goals: Fair Progress towards PT goals: Progressing toward goals    Frequency    Min 1X/week      PT Plan      Co-evaluation              AM-PAC PT "6 Clicks" Mobility   Outcome  Measure  Help needed turning from your back to your side while in a flat bed without using bedrails?: A Little Help needed moving from lying on your back to sitting on the side of a flat bed without using bedrails?: A Little Help needed moving to and from a bed to a chair (including a wheelchair)?: A Little Help needed standing up from a chair using your arms (e.g., wheelchair or bedside chair)?: A Little Help needed to walk in hospital room?: A Little Help needed climbing 3-5 steps with a railing? : A Lot 6 Click Score: 17    End of Session Equipment Utilized During Treatment: Oxygen Activity Tolerance: Patient tolerated treatment well;Patient limited by fatigue Patient left: with chair alarm set;in chair;with call bell/phone within reach;with family/visitor present Nurse Communication: Mobility status PT Visit Diagnosis: Muscle weakness (generalized) (M62.81);Difficulty in walking, not elsewhere classified (R26.2);Other abnormalities of gait and mobility (R26.89);Unsteadiness on feet (R26.81)     Time: 5621-3086 PT Time Calculation (min) (ACUTE ONLY): 19 min  Charges:    $Therapeutic Activity: 8-22 mins PT General Charges $$ ACUTE PT VISIT: 1 Visit                     Aleda Grana, PT, DPT 08/20/23, 2:58 PM   Sandi Mariscal 08/20/2023, 2:57 PM

## 2023-08-20 NOTE — Plan of Care (Signed)
  Problem: Pain Management: Goal: General experience of comfort will improve Outcome: Progressing   Problem: Safety: Goal: Ability to remain free from injury will improve Outcome: Progressing   Problem: Skin Integrity: Goal: Risk for impaired skin integrity will decrease Outcome: Progressing

## 2023-08-21 DIAGNOSIS — J9621 Acute and chronic respiratory failure with hypoxia: Secondary | ICD-10-CM | POA: Diagnosis not present

## 2023-08-21 DIAGNOSIS — J9622 Acute and chronic respiratory failure with hypercapnia: Secondary | ICD-10-CM | POA: Diagnosis not present

## 2023-08-21 LAB — BASIC METABOLIC PANEL
Anion gap: 9 (ref 5–15)
BUN: 10 mg/dL (ref 8–23)
CO2: 33 mmol/L — ABNORMAL HIGH (ref 22–32)
Calcium: 8.8 mg/dL — ABNORMAL LOW (ref 8.9–10.3)
Chloride: 97 mmol/L — ABNORMAL LOW (ref 98–111)
Creatinine, Ser: 0.65 mg/dL (ref 0.44–1.00)
GFR, Estimated: >60 mL/min (ref >60)
Glucose, Bld: 108 mg/dL — ABNORMAL HIGH (ref 70–99)
Potassium: 4 mmol/L (ref 3.5–5.1)
Sodium: 139 mmol/L (ref 135–145)

## 2023-08-21 LAB — CBC
HCT: 25.2 % — ABNORMAL LOW (ref 36.0–46.0)
Hemoglobin: 7.7 g/dL — ABNORMAL LOW (ref 12.0–15.0)
MCH: 27.8 pg (ref 26.0–34.0)
MCHC: 30.6 g/dL (ref 30.0–36.0)
MCV: 91 fL (ref 80.0–100.0)
Platelets: 528 10*3/uL — ABNORMAL HIGH (ref 150–400)
RBC: 2.77 MIL/uL — ABNORMAL LOW (ref 3.87–5.11)
RDW: 16.1 % — ABNORMAL HIGH (ref 11.5–15.5)
WBC: 9.5 10*3/uL (ref 4.0–10.5)
nRBC: 0 % (ref 0.0–0.2)

## 2023-08-21 LAB — GLUCOSE, CAPILLARY
Glucose-Capillary: 127 mg/dL — ABNORMAL HIGH (ref 70–99)
Glucose-Capillary: 298 mg/dL — ABNORMAL HIGH (ref 70–99)
Glucose-Capillary: 55 mg/dL — ABNORMAL LOW (ref 70–99)
Glucose-Capillary: 216 mg/dL — ABNORMAL HIGH (ref 70–99)

## 2023-08-21 MED ORDER — LOSARTAN POTASSIUM 25 MG PO TABS: 12.5000 mg | ORAL_TABLET | Freq: Every day | ORAL | Status: DC

## 2023-08-21 MED ORDER — FUROSEMIDE 40 MG PO TABS: 20.0000 mg | ORAL_TABLET | Freq: Every day | ORAL | Status: DC

## 2023-08-21 MED ORDER — APIXABAN 5 MG PO TABS: 5.0000 mg | ORAL_TABLET | Freq: Two times a day (BID) | ORAL | 0 refills | Status: DC

## 2023-08-21 MED ORDER — POLYVINYL ALCOHOL 1.4 % OP SOLN: 2.0000 [drp] | OPHTHALMIC | Status: DC | PRN

## 2023-08-21 MED ORDER — ALUM & MAG HYDROXIDE-SIMETH 200-200-20 MG/5ML PO SUSP: 30.0000 mL | Freq: Four times a day (QID) | ORAL | Status: DC | PRN

## 2023-08-21 MED ORDER — ADULT MULTIVITAMIN W/MINERALS CH: 1.0000 | ORAL_TABLET | Freq: Every day | ORAL | Status: DC

## 2023-08-21 MED ORDER — DOCUSATE SODIUM 100 MG PO CAPS: 100.0000 mg | ORAL_CAPSULE | Freq: Two times a day (BID) | ORAL | Status: DC | PRN

## 2023-08-21 MED ORDER — ATORVASTATIN CALCIUM 40 MG PO TABS: 40.0000 mg | ORAL_TABLET | Freq: Every day | ORAL | Status: DC

## 2023-08-21 MED ORDER — PANTOPRAZOLE SODIUM 40 MG PO TBEC: 40.0000 mg | DELAYED_RELEASE_TABLET | Freq: Every day | ORAL | Status: DC

## 2023-08-21 MED ORDER — POLYETHYLENE GLYCOL 3350 17 G PO PACK: 17.0000 g | PACK | Freq: Every day | ORAL | Status: DC | PRN

## 2023-08-21 MED ORDER — IPRATROPIUM-ALBUTEROL 0.5-2.5 (3) MG/3ML IN SOLN
3.0000 mL | Freq: Two times a day (BID) | RESPIRATORY_TRACT | Status: DC
  Administered 2023-08-21 – 2023-08-22 (×2): 3 mL via RESPIRATORY_TRACT
  Filled 2023-08-21 (×2): qty 3

## 2023-08-21 MED ORDER — GABAPENTIN 400 MG PO CAPS: 400.0000 mg | ORAL_CAPSULE | Freq: Three times a day (TID) | ORAL | Status: DC

## 2023-08-21 MED ORDER — BUSPIRONE HCL 5 MG PO TABS: 5.0000 mg | ORAL_TABLET | Freq: Two times a day (BID) | ORAL | Status: DC

## 2023-08-21 MED ORDER — METOPROLOL TARTRATE 25 MG PO TABS: 12.5000 mg | ORAL_TABLET | Freq: Two times a day (BID) | ORAL | Status: DC

## 2023-08-21 MED ORDER — VENLAFAXINE HCL ER 150 MG PO CP24: 150.0000 mg | ORAL_CAPSULE | Freq: Every day | ORAL | Status: DC

## 2023-08-21 MED ORDER — METFORMIN HCL 1000 MG PO TABS: 1000.0000 mg | ORAL_TABLET | Freq: Two times a day (BID) | ORAL | Status: DC

## 2023-08-21 MED ORDER — ALBUTEROL SULFATE HFA 108 (90 BASE) MCG/ACT IN AERS: 2.0000 | INHALATION_SPRAY | Freq: Four times a day (QID) | RESPIRATORY_TRACT | Status: DC | PRN

## 2023-08-21 MED ORDER — MONTELUKAST SODIUM 10 MG PO TABS: 10.0000 mg | ORAL_TABLET | Freq: Every day | ORAL | Status: DC

## 2023-08-21 MED ORDER — ENSURE ENLIVE PO LIQD: 237.0000 mL | Freq: Three times a day (TID) | ORAL | 12 refills | Status: DC

## 2023-08-21 NOTE — Progress Notes (Signed)
   08/21/23 1730  Spiritual Encounters  Type of Visit Initial  Care provided to: Pt and family  Referral source Chaplain assessment  Reason for visit  (Prayer)  OnCall Visit Yes  Spiritual Framework  Presenting Themes Significant life change  Community/Connection Family  Patient Stress Factors None identified  Family Stress Factors None identified   Chaplain met patient's daughter in the hall and assessed needs.  Was invited to patient's room for prayer with the family.  Chaplain will follow-up as requested by the patient, family or staff.    Rev. Rana M. Earlene Plater, MDiv.  Chaplain Resident [facility], [facility], [facility]

## 2023-08-21 NOTE — Consult Note (Signed)
[facility] Liaison Consult Note    08/21/2023  LEVI PEPPEL 11-16-1950 [id number]  Primary Care Provider:   Aura Dials,  NP-[facility].  Patient is currently active with Care Management for chronic disease management services.  Patient has been engaged by a Presenter, broadcasting.  Our community based plan of care has focused on disease management and community resource support.   Patient will receive a post hospital call and will be evaluated for assessments and disease process education.   Plan: Pt will discharge to a SNF level of care. SNF will continue to address pt's needs.   Inpatient Transition Of Care [TOC] team member to make aware that Care Management following.  Of note, Care Management services does not replace or interfere with any services that are needed or arranged by inpatient [facility] care management team.   For additional questions or referrals please contact:    Elliot Cousin, RN, [facility] Liaison [place]   [facility], Population Health Office Hours MTWF  8:00 am-6:00 pm Direct Dial: [phone] mobile [phone] [Office toll free line] Office Hours are M-F 8:30 - 5 pm [email or web address]

## 2023-08-21 NOTE — TOC Progression Note (Addendum)
Transition of Care [facility]) - Progression Note    Patient Details  Name: Jordan Chan MRN: [id number] Date of Birth: [birth year]  Transition of Care [facility]) CM/SW Contact  Chapman Fitch, RN Phone Number: 08/21/2023, 8:45 AM  Clinical Narrative:     Insurance authorization for [facility]  Family would like to transport at discharge. Check with Mitch with [employer] and patient only has orders for nocturnal O2, so she would not be eligible for a portable tank to be delivered to room.  Family to bring a tank they have at home to confirm it is full of oxygen.  They are aware that if she does not have portable O2 for transport, she would have to go by EMS  Expected Discharge Plan: Skilled Nursing Facility Barriers to Discharge: Continued Medical Work up  Expected Discharge Plan and Services     Post Acute Care Choice:  (TBD) Living arrangements for the past 2 months: Single Family Home                                       Social Determinants of Health (SDOH) Interventions SDOH Screenings   Food Insecurity: No Food Insecurity (04/16/2023)  Housing: Unknown (08/20/2023)  Transportation Needs: No Transportation Needs (08/20/2023)  Utilities: Patient Unable To Answer (08/09/2023)  Alcohol Screen: Low Risk  (08/20/2023)  Depression (PHQ2-9): Low Risk  (08/04/2023)  Recent Concern: Depression (PHQ2-9) - Medium Risk (07/27/2023)  Financial Resource Strain: Low Risk  (08/20/2023)  Physical Activity: Inactive (12/23/2022)  Social Connections: Patient Unable To Answer (08/09/2023)  Stress: No Stress Concern Present (12/23/2022)  Tobacco Use: Medium Risk (08/20/2023)    Readmission Risk Interventions    08/18/2023    2:50 PM  Readmission Risk Prevention Plan  Transportation Screening Complete  Medication Review (RN Care Manager) Complete  PCP or Specialist appointment within 3-5 days of discharge Complete  SW Recovery Care/Counseling Consult Complete  Palliative Care Screening Not  Applicable  Skilled Nursing Facility Complete

## 2023-08-21 NOTE — Progress Notes (Signed)
Occupational Therapy Treatment Patient Details Name: Jordan Chan MRN: [id number] DOB: 07/13/1951 Today's Date: 08/21/2023   History of present illness Pt is a 73 y/o F admitted on 08/09/23 after presenting with c/o respiratory distress, O2 sats in the 50s-60s on room air. Pt admitted to ICU for tx of acute hypoxic hypercapnic respiratory failure 2/2 suspected PNA & AECOPD, pt intubated but successfully extubated on 08/15/23. Pt also found to have acute PE during admission. PMH: B12 deficiency, chronic pain syndrome s/p intrathecal pump, paroxysmal a-fib, stress-induced cardiomyopathy, PE, DM, COPD on nocturnal O2, GERD, OSA, IBS, CVA   OT comments  Jordan Chan was seen for OT treatment on this date. Upon arrival to room pt in bed, agreeable to tx. Pt requires CGA + RW for toilet t/f, SUPERVIISON pericare and standing hand washing. IS provided and educated on HEP. Pt making good progress toward goals, will continue to follow POC. Discharge recommendation remains appropriate.       If plan is discharge home, recommend the following:  A little help with walking and/or transfers;A little help with bathing/dressing/bathroom;Help with stairs or ramp for entrance   Equipment Recommendations  BSC/3in1    Recommendations for Other Services      Precautions / Restrictions Precautions Precautions: Fall Restrictions Weight Bearing Restrictions Per Provider Order: No       Mobility Bed Mobility Overal bed mobility: Needs Assistance Bed Mobility: Supine to Sit, Sit to Supine   Sidelying to sit: Supervision, HOB elevated, Used rails Supine to sit: Supervision, HOB elevated          Transfers Overall transfer level: Needs assistance Equipment used: Rolling walker (2 wheels) Transfers: Sit to/from Stand Sit to Stand: Contact guard assist                 Balance Overall balance assessment: Needs assistance Sitting-balance support: Feet supported Sitting balance-Leahy Scale:  Good     Standing balance support: During functional activity, No upper extremity supported Standing balance-Leahy Scale: Fair                             ADL either performed or assessed with clinical judgement   ADL Overall ADL's : Needs assistance/impaired                                       General ADL Comments: CGA + RW for toilet t/f, SUPERVIISON pericare and standing hand washing.      Cognition Arousal: Alert Behavior During Therapy: WFL for tasks assessed/performed Overall Cognitive Status: Within Functional Limits for tasks assessed                                                General Comments SpO2 94% on 3L [place]    Pertinent Vitals/ Pain       Pain Assessment Pain Assessment: No/denies pain   Frequency  Min 1X/week        Progress Toward Goals  OT Goals(current goals can now be found in the care plan section)  Progress towards OT goals: Progressing toward goals  Acute Rehab OT Goals Patient Stated Goal: to go home OT Goal Formulation: With patient/family Time For Goal Achievement: 08/30/23 Potential to Achieve Goals: Good ADL  Goals Pt Will Perform Grooming: with modified independence;sitting Pt Will Perform Lower Body Dressing: with modified independence;sit to/from stand Pt Will Transfer to Toilet: with modified independence;ambulating  Plan      Co-evaluation                 AM-PAC OT "6 Clicks" Daily Activity     Outcome Measure   Help from another person eating meals?: None Help from another person taking care of personal grooming?: A Little Help from another person toileting, which includes using toliet, bedpan, or urinal?: A Little Help from another person bathing (including washing, rinsing, drying)?: A Little Help from another person to put on and taking off regular upper body clothing?: A Little Help from another person to put on and taking off regular lower body clothing?: A  Little 6 Click Score: 19    End of Session Equipment Utilized During Treatment: Rolling walker (2 wheels);Oxygen  OT Visit Diagnosis: Unsteadiness on feet (R26.81);Other abnormalities of gait and mobility (R26.89)   Activity Tolerance Patient tolerated treatment well   Patient Left in bed;with call bell/phone within reach;with family/visitor present   Nurse Communication Mobility status        Time: 8295-6213 OT Time Calculation (min): 20 min  Charges: OT General Charges $OT Visit: 1 Visit OT Treatments $Self Care/Home Management : 8-22 mins  Kathie Dike, M.S. OTR/L  08/21/23, 3:08 PM  ascom [phone]

## 2023-08-21 NOTE — Progress Notes (Signed)
Heart Failure Stewardship Pharmacy Note  PCP: Marjie Skiff, NP PCP-Cardiologist: Yvonne Kendall, MD  HPI: Jordan Chan is a 73 y.o. female with B12 deficiency, chronic pain syndrome s/p intrathecal pump, paroxysmal atrial fibrillation, stress-induced cardiomyopathy in the setting of pain pump malfunction and opioid withdrawal, PE following hospitalization for pneumonia, DM, COPD on 2L O2 overnight, GERD, OSA, and IBS  who presented with acute respiratory distress. Lactic acid on admission was >9. BNP was 241.9. HS-troponin was 20. Beta-hydroxybutyric acid was 1.39. Suspected septic shock due to pneumonia with acute on chronic CHF. Also found to have an acute PE. Required intubation. After 1 failed extubation, was successfully extubated on 08/15/23.   Pertinent cardiac history: Echo in 04/2017 with LVEF of 25-30% and hypokinesis of mid-apical myocardium. Given high stress from pain, LHC revealing no obstructive disease and significant apical akinesis, patient was diagnosed with Takotsubo cardiomyopathy. In 08/2017, echo showed LVEF improved to 50-55%. In 07/2020, LVEF declines mildly to 45-50%. LVEF in 12/2020 unchanged and noted grade II diastolic dysfunction. After experiencing atrial fibrillation, echo in 09/2021 showed LVEF downt o 25-30% with moderately reduced RV function. Stress test in 11/2021 showed no significant ischemia and LVEF of 67%. Most recent echo this admission showed LVEF back down to 25-30% with grade I diastolic function and low-normal RV function.   Pertinent Lab Values: Creatinine  Date Value Ref Range Status  09/14/2013 0.68 0.60 - 1.30 mg/dL Final   Creatinine, Ser  Date Value Ref Range Status  08/19/2023 0.67 0.44 - 1.00 mg/dL Final   BUN  Date Value Ref Range Status  08/19/2023 12 8 - 23 mg/dL Final  81/19/1478 21 8 - 27 mg/dL Final  29/56/2130 8 7 - 18 mg/dL Final   Potassium  Date Value Ref Range Status  08/19/2023 3.9 3.5 - 5.1 mmol/L Final   09/14/2013 3.5 3.5 - 5.1 mmol/L Final   Sodium  Date Value Ref Range Status  08/19/2023 136 135 - 145 mmol/L Final  06/17/2023 138 134 - 144 mmol/L Final  09/14/2013 133 (L) 136 - 145 mmol/L Final   B Natriuretic Peptide  Date Value Ref Range Status  08/09/2023 241.9 (H) 0.0 - 100.0 pg/mL Final    Comment:    Performed at [facility], [address] [place], [place] [address]   Magnesium  Date Value Ref Range Status  08/19/2023 2.0 1.7 - 2.4 mg/dL Final    Comment:    Performed at [facility], [address] [place], [place] [address]   Hemoglobin A1C  Date Value Ref Range Status  04/28/2016 7.7%  Final   HB A1C (BAYER DCA - WAIVED)  Date Value Ref Range Status  06/12/2023 7.4 (H) 4.8 - 5.6 % Final    Comment:             Prediabetes: 5.7 - 6.4          Diabetes: >6.4          Glycemic control for adults with diabetes: <7.0    Digoxin Level  Date Value Ref Range Status  09/13/2021 0.6 (L) 0.8 - 2.0 ng/mL Final    Comment:    Performed at [facility], [address] [place], [place] [address]   TSH  Date Value Ref Range Status  08/09/2023 3.907 0.350 - 4.500 uIU/mL Final    Comment:    Performed by a 3rd Generation assay with a functional sensitivity of <=0.01 uIU/mL. Performed at [facility], [phone]  [address] [place], [place] [address]   09/24/2022 3.850 0.450 - 4.500 uIU/mL Final   LDH  Date Value Ref Range Status  12/18/2019 122 98 - 192 U/L Final    Comment:    Performed at [facility], [address] [place], [place] [address]    Vital Signs: Admission weight: 149.9 lbs Temp:  [98 F (36.7 C)-99 F (37.2 C)] 98.3 F (36.8 C) (01/17 0435) Pulse Rate:  [94-106] 94 (01/17 0435) Cardiac Rhythm: Atrial fibrillation (01/16 1900) Resp:  [18-20] 20 (01/16 1958) BP: (101-163)/(47-82) 101/47 (01/17 0435) SpO2:  [93 %-96 %] 94 % (01/17 0435) Weight:  [69.5 kg (153 lb 3.5 oz)] 69.5 kg (153 lb 3.5 oz)  (01/17 0449)  Intake/Output Summary (Last 24 hours) at 08/21/2023 0700 Last data filed at 08/20/2023 1550 Gross per 24 hour  Intake 720 ml  Output --  Net 720 ml   Current Heart Failure Medications:  Loop diuretic: none Beta-Blocker: metoprolol tartrate 12.5 mg BID ACEI/ARB/ARNI: losartan 12.5 mg daily MRA: none SGLT2i: none Other: none  Prior to admission Heart Failure Medications:  Loop diuretic: furosemide 20 mg daily Beta-Blocker: metoprolol succinate 12.5 mg daily ACEI/ARB/ARNI: losartan 12.5 mg daily MRA: none  SGLT2i: none Other: none  Assessment: 1. Acute on chronic combined systolic and diastolic heart failure (LVEF 25-30%) with grade I diastolic dysfunction, due to NICM. NYHA class IV symptoms.  -Symptoms: Patient reports shortness of breath is stable. Orthopnea is somewhat lessened. Still feels very fatigued sitting in bed. -Volume: Patient reports good urine output on oral furosemide. No LEE noted. JVP improving. Weight is down 3 lbs from yesterday and up 13 lbs from admission, though suspect patient was dehydrated on admission. -Hemodynamics: BP elevated from 130-140s/60s. HR stable in 90s. -BB: Patient is on metoprolol tartrate 12.5 mg BID. Will need to consolidate to metoprolol succinate prior to discharge.  -ACEI/ARB/ARNI: Currently on losartan 12.5 mg daily. Given BP up, can likely increase. -MRA: Patient would benefit from the addition of spironolactone. K 3.9. -SGLT2i: Not a great candidate for SGLT2i given intrathecal pump and probably incontinence due to poor mobility.  Plan: 1) Medication changes recommended at this time: -Consider starting spironolactone 12.5 mg daily. This will add HF GDMT and give gentle diuresis. May require furosemide reduction to every other day if added, would need to monitor  2) Patient assistance: -Pending  3) Education: - Patient has been educated on current HF medications and potential additions to HF medication regimen -  Patient verbalizes understanding that over the next few months, these medication doses may change and more medications may be added to optimize HF regimen - Patient has been educated on basic disease state pathophysiology and goals of therapy  Medication Assistance / Insurance Benefits Check: Does the patient have prescription insurance?    Type of insurance plan:  Does the patient qualify for medication assistance through manufacturers or grants? Pending   Outpatient Pharmacy: Prior to admission outpatient pharmacy: [facility]     Please do not hesitate to reach out with questions or concerns,  Enos Fling, PharmD, CPP, BCPS Heart Failure Pharmacist  Phone - [phone] 08/21/2023 7:00 AM

## 2023-08-21 NOTE — Plan of Care (Signed)
  Problem: Education: Goal: Knowledge of General Education information will improve Description: Including pain rating scale, medication(s)/side effects and non-pharmacologic comfort measures Outcome: Progressing   Problem: Health Behavior/Discharge Planning: Goal: Ability to manage health-related needs will improve Outcome: Progressing   Problem: Clinical Measurements: Goal: Ability to maintain clinical measurements within normal limits will improve Outcome: Progressing Goal: Will remain free from infection Outcome: Progressing Goal: Diagnostic test results will improve Outcome: Progressing Goal: Respiratory complications will improve Outcome: Progressing Goal: Cardiovascular complication will be avoided Outcome: Progressing   Problem: Activity: Goal: Risk for activity intolerance will decrease Outcome: Progressing   Problem: Nutrition: Goal: Adequate nutrition will be maintained Outcome: Progressing   Problem: Coping: Goal: Level of anxiety will decrease Outcome: Progressing   Problem: Elimination: Goal: Will not experience complications related to bowel motility Outcome: Progressing Goal: Will not experience complications related to urinary retention Outcome: Progressing   Problem: Pain Management: Goal: General experience of comfort will improve Outcome: Progressing   Problem: Safety: Goal: Ability to remain free from injury will improve Outcome: Progressing   Problem: Skin Integrity: Goal: Risk for impaired skin integrity will decrease Outcome: Progressing   Problem: Education: Goal: Ability to describe self-care measures that may prevent or decrease complications (Diabetes Survival Skills Education) will improve Outcome: Progressing   Problem: Coping: Goal: Ability to adjust to condition or change in health will improve Outcome: Progressing   Problem: Fluid Volume: Goal: Ability to maintain a balanced intake and output will improve Outcome:  Progressing   Problem: Health Behavior/Discharge Planning: Goal: Ability to identify and utilize available resources and services will improve Outcome: Progressing Goal: Ability to manage health-related needs will improve Outcome: Progressing   Problem: Metabolic: Goal: Ability to maintain appropriate glucose levels will improve Outcome: Progressing   Problem: Nutritional: Goal: Maintenance of adequate nutrition will improve Outcome: Progressing Goal: Progress toward achieving an optimal weight will improve Outcome: Progressing   Problem: Skin Integrity: Goal: Risk for impaired skin integrity will decrease Outcome: Progressing   Problem: Tissue Perfusion: Goal: Adequacy of tissue perfusion will improve Outcome: Progressing   Problem: Education: Goal: Ability to describe self-care measures that may prevent or decrease complications (Diabetes Survival Skills Education) will improve Outcome: Progressing   Problem: Cardiac: Goal: Ability to maintain an adequate cardiac output will improve Outcome: Progressing   Problem: Health Behavior/Discharge Planning: Goal: Ability to identify and utilize available resources and services will improve Outcome: Progressing Goal: Ability to manage health-related needs will improve Outcome: Progressing   Problem: Fluid Volume: Goal: Ability to achieve a balanced intake and output will improve Outcome: Progressing   Problem: Metabolic: Goal: Ability to maintain appropriate glucose levels will improve Outcome: Progressing   Problem: Nutritional: Goal: Maintenance of adequate nutrition will improve Outcome: Progressing Goal: Maintenance of adequate weight for body size and type will improve Outcome: Progressing   Problem: Respiratory: Goal: Will regain and/or maintain adequate ventilation Outcome: Progressing   Problem: Urinary Elimination: Goal: Ability to achieve and maintain adequate renal perfusion and functioning will  improve Outcome: Progressing

## 2023-08-21 NOTE — Discharge Summary (Signed)
 Physician Discharge Summary   Patient: Jordan Chan MRN: [id number] DOB: 20-Aug-1950  Admit date:     08/09/2023  Discharge date: 08/22/23  Discharge Physician: Ernestene Mention   PCP: Marjie Skiff, NP   Recommendations at discharge:  Follow up with PCP and cardiology in within 1 week of this discharge Obtain Follow up Labs: CBC, BMP   Discharge Diagnoses: Active Problems:   Multiple subsegmental pulmonary emboli without acute cor pulmonale (HCC)  Principal Problem (Resolved):   Acute on chronic respiratory failure with hypoxia and hypercapnia (HCC) Resolved Problems:   Acute respiratory failure with hypoxia and hypercapnia (HCC)   Sepsis due to pneumonia [facility]) Toxic metabolic encephalopathy  Hospital Course: 73 y.o female with significant PMH of B12 deficiency, chronic pain syndrome s/p intrathecal pump, paroxysmal atrial fibrillation, stress-induced cardiomyopathy in the setting of pain pump malfunction and opioid withdrawal, PE following hospitalization for pneumonia, DM, COPD, GERD, OSA, and IBS who presented to the ED with chief complaints of acute respiratory distress found with sats in the 50 to 60% on RA per EMS.  Respiratory failure, likely CHF and bilateral pneumonia. Extubated 08/15/23 and did very well.  Assessment and Plan: Acute on chronic respiratory failure with hypoxia and hypercapnia (HCC) secondary to Multiple subsegmental pulmonary emboli without acute cor pulmonale and Sepsis due to Aspiration pneumonia (HCC) Leukocytosis improved At home on 2LPM O2 [place] at night time. Currently requiring 2LPM O2 [place] continuous.  Aspiration pneumonia: completed abx course. Continue on bronchodilators & encourage incentive spirometry   Continue eliquis for PE Underwent swallow eval--> Regular;Thin liquid (with cut meats) -> diet orders changed Performed IS Q4hrs while awake   Toxic metabolic encephalopathy: almost improved likely multifactorial- hospital/ICU delirium,  narcotic use from chronic pain, infection. Much improved. Re-orient prn. Continue w/ supportive care    Diabetes mellitus: Mildly uncontrolled, Cont Lantus 12 units daily, gave sliding scale insulin as needed, resumed Metformin 1000mg  BID at discharge Follow fingersticks   Generalized weakness: PT recommended Wheel chair and Home health PT   Chronic pain: continue on home dose of gabapentin   Circulatory shock: see Dr. Doreene Adas notes on how met circulatory shock criteria. Resolved   PAF: HR in 90s Continue eliquis.  Cont metoprolol at 12.5mg  BID   Chronic systolic CHF: resume lasix 20mg  daily  Monitor I/Os.  Continue losartan low dose Continue metoprolol   Acute hypoxic respiratory failure: continue on supplemental oxygen and wean as tolerated   PE: d/ced IV heparin.  Cont eliquis    B/l pleural effusions: cont lasix 20mg  daily    AKI: resolved    Depression: severity unknown. Continue on home dose of buspar, venlafaxine    Anemia: Repeat Hb is stable at 7.7--> Denies any melena/ blood in stools.  It appears that her baseline Hb is around 11 based on chart review Iron studies: with low Iron--> started Ferrous sulphate BID tablets at discharge,  folate wnl 12.4, B12 wnl 725 Repeat CBC at PCP visit in 3-4 days   Physical debility: PT Visit Diagnosis: Muscle weakness (generalized) (M62.81);Difficulty in walking, not elsewhere classified (R26.2);Other abnormalities of gait and mobility (R26.89);Unsteadiness on feet  Recommended home Health PT  Ordered home health PT/RN, wheel chair as recommended by PT       Disposition: Home with Home health  DISCHARGE MEDICATION: Allergies as of 08/22/2023       Reactions   Other Palpitations   IV steroids   Pain Patch [menthol] Anaphylaxis   Avelox [moxifloxacin Hcl In  Nacl] Other (See Comments)   Upset stomach   Doxycycline Diarrhea   Erythromycin Nausea Only, Other (See Comments)   Can take a Z-Pak just fine Other  reaction(s): Other (See Comments) Can take Z-Pak Can take a Z-Pak just fine   Fentanyl Nausea Only, Rash   Moxifloxacin Hcl Other (See Comments)   Upset stomach Upset stomach   Oxycontin [oxycodone] Hives   Ozempic [semaglutide] Nausea Only        Medication List     STOP taking these medications    cyclobenzaprine 5 MG tablet Commonly known as: FLEXERIL   gabapentin 800 MG tablet Commonly known as: NEURONTIN Replaced by: gabapentin 400 MG capsule   HYDROcodone-acetaminophen 10-325 MG tablet Commonly known as: NORCO   metoprolol succinate 25 MG 24 hr tablet Commonly known as: TOPROL-XL       TAKE these medications    Accu-Chek Aviva Plus test strip Generic drug: glucose blood TEST THREE TIMES DAILY   Accu-Chek Softclix Lancets lancets TEST BLOOD SUGAR THREE TIMES DAILY   albuterol 108 (90 Base) MCG/ACT inhaler Commonly known as: VENTOLIN HFA Inhale 2 puffs into the lungs every 6 (six) hours as needed for wheezing or shortness of breath.   albuterol (2.5 MG/3ML) 0.083% nebulizer solution Commonly known as: PROVENTIL Take 3 mLs (2.5 mg total) by nebulization every 4 (four) hours as needed for wheezing or shortness of breath.   alum & mag hydroxide-simeth 200-200-20 MG/5ML suspension Commonly known as: MAALOX/MYLANTA Take 30 mLs by mouth every 6 (six) hours as needed for indigestion or heartburn.   apixaban 5 MG Tabs tablet Commonly known as: ELIQUIS Take 1 tablet (5 mg total) by mouth 2 (two) times daily.   atorvastatin 40 MG tablet Commonly known as: LIPITOR Take 1 tablet (40 mg total) by mouth daily.   busPIRone 5 MG tablet Commonly known as: BUSPAR Take 1 tablet (5 mg total) by mouth 2 (two) times daily.   cholecalciferol 25 MCG (1000 UNIT) tablet Commonly known as: VITAMIN D3 Take 1,000 Units by mouth daily.   Cyanocobalamin 1000 MCG/ML Kit Inject 1,000 mcg as directed every 30 (thirty) days.   docusate sodium 100 MG capsule Commonly known  as: COLACE Take 1 capsule (100 mg total) by mouth 2 (two) times daily as needed for mild constipation.   DropSafe Alcohol Prep 70 % Pads USE TWICE DAILY  WITH  SUGAR  CHECKS   Easy Air Compressor Nebulizer Misc Use to inhaler nebulizer treatments at needed per instructions on nebulizer prescription   feeding supplement Liqd Take 237 mLs by mouth 3 (three) times daily between meals.   ferrous sulfate 325 (65 FE) MG EC tablet Take 1 tablet (325 mg total) by mouth 2 (two) times daily.   folic acid 1 MG tablet Commonly known as: FOLVITE TAKE 1 TABLET EVERY DAY   FreeStyle Libre 2 Reader Devi To check blood sugars 3-4 times daily due to insulin use. DX E11.40   FreeStyle Libre 2 Sensor Misc To check blood sugars 3-4 times daily due to insulin use. DX E11.40   furosemide 40 MG tablet Commonly known as: LASIX Take 0.5 tablets (20 mg total) by mouth daily.   gabapentin 400 MG capsule Commonly known as: NEURONTIN Take 1 capsule (400 mg total) by mouth 3 (three) times daily. Replaces: gabapentin 800 MG tablet   insulin lispro 100 UNIT/ML KwikPen Commonly known as: HumaLOG KwikPen Inject 5 Units into the skin 3 (three) times daily before meals. Do not inject insulin if  you are not going to eat meal.  Only take before a meal.   losartan 25 MG tablet Commonly known as: COZAAR Take 0.5 tablets (12.5 mg total) by mouth daily.   metFORMIN 1000 MG tablet Commonly known as: GLUCOPHAGE Take 1 tablet (1,000 mg total) by mouth 2 (two) times daily with a meal.   metoprolol tartrate 25 MG tablet Commonly known as: LOPRESSOR Take 0.5 tablets (12.5 mg total) by mouth 2 (two) times daily.   montelukast 10 MG tablet Commonly known as: SINGULAIR Take 1 tablet (10 mg total) by mouth at bedtime.   multivitamin with minerals Tabs tablet Take 1 tablet by mouth daily.   naloxone 4 MG/0.1ML Liqd nasal spray kit Commonly known as: NARCAN Place 1 spray into the nose as needed for up to 365  doses (for opioid-induced respiratory depresssion). In case of emergency (overdose), spray once into each nostril. If no response within 3 minutes, repeat application and call 911.   ondansetron 4 MG tablet Commonly known as: Zofran Take 1 tablet (4 mg total) by mouth daily as needed for nausea or vomiting.   OXYGEN Inhale 2 L into the lungs at bedtime.   PAIN MANAGEMENT INTRATHECAL (IT) PUMP 1 each by Intrathecal route. Intrathecal (IT) medication:  Morphine 5.0 mg/ml, Bupivacaine 20.0 mg/ml,  Clonidine 100.0 mcg/ml Patient does not remember current. Adjusted every 3 months at [facility]   pantoprazole 40 MG tablet Commonly known as: PROTONIX Take 1 tablet (40 mg total) by mouth daily.   polyethylene glycol 17 g packet Commonly known as: MIRALAX / GLYCOLAX Take 17 g by mouth daily as needed for moderate constipation.   polyvinyl alcohol 1.4 % ophthalmic solution Commonly known as: LIQUIFILM TEARS Place 2 drops into both eyes as needed for dry eyes.   Trelegy Ellipta 100-62.5-25 MCG/ACT Aepb Generic drug: Fluticasone-Umeclidin-Vilant INHALE 1 PUFF INTO THE LUNGS DAILY -42 DAY EXPIRATION AFTER OPENING FOIL TRAY   Tresiba FlexTouch 100 UNIT/ML FlexTouch Pen Generic drug: insulin degludec INJECT 55 UNITS SUBCUTANEOUSLY AT BEDTIME   True Metrix Meter w/Device Kit Use to check blood sugar 4 times a day   Trulicity 4.5 MG/0.5ML Soaj Generic drug: Dulaglutide INJECT 4.5MG  INTO THE SKIN ONCE WEEKLY AS DIRECTED   venlafaxine XR 150 MG 24 hr capsule Commonly known as: EFFEXOR-XR Take 1 capsule (150 mg total) by mouth daily.               Durable Medical Equipment  (From admission, onward)           Start     Ordered   08/22/23 1023  DME Oxygen  Once       Comments: Deliver Oxygen to patient's room. SaO2 on room air at rest = 83% SaO2 on room air while ambulating =unsafe to assess due to desaturation on rm air at rest SaO2 on 2 liters of O2 while  ambulating = 94%  Question Answer Comment  Length of Need 6 Months   Mode or (Route) Nasal cannula   Liters per Minute 2   Frequency Continuous (stationary and portable oxygen unit needed)   Oxygen delivery system Gas      08/22/23 1024   08/22/23 1023  DME standard manual wheelchair with seat cushion  Once       Comments: Patient suffers from physical debility and dyspnea which impairs their ability to perform daily activities like bathing, dressing, and toileting in the home.  A cane, crutch, or walker will not resolve issue with  performing activities of daily living. A wheelchair will allow patient to safely perform daily activities. Patient can safely propel the wheelchair in the home or has a caregiver who can provide assistance. Length of need 6 months . Accessories: elevating leg rests (ELRs), wheel locks, extensions and anti-tippers.  Please deliver to patient's room before discharge   08/22/23 1024   08/21/23 1634  DME Oxygen  Once       Question Answer Comment  Length of Need 6 Months   Mode or (Route) Nasal cannula   Liters per Minute 2   Frequency Continuous (stationary and portable oxygen unit needed)   Oxygen conserving device No   Oxygen delivery system Gas      08/21/23 1633            Follow-up Information     [facility]. Go on 08/26/2023.   Specialty: Cardiology Why: Hospital Follow-Up 08/26/23 @ 9:00 AM Please bring all medications to appointment [facility], [address] Second Floor Free Valet Parking @ the door Contact information: [address] [place] [address] [phone]               Discharge Exam: Ceasar Mons Weights   08/20/23 0441 08/21/23 0449 08/22/23 0500  Weight: 71.1 kg 69.5 kg 67.6 kg   Constitutional:      General: She is not in acute distress.    Appearance: Normal appearance.  HENT:     Head: Normocephalic and atraumatic.     Nose: Nose  normal. No congestion.     Mouth/Throat:     Mouth: Mucous membranes are moist.     Pharynx: Oropharynx is clear. No oropharyngeal exudate.  Eyes:     Extraocular Movements: Extraocular movements intact.     Conjunctiva/sclera: Conjunctivae normal.  Cardiovascular:     Rate and Rhythm: Normal rate. Rhythm irregular.     Pulses: Normal pulses.     Heart sounds: Normal heart sounds.  Pulmonary:     Effort: Pulmonary effort is normal. No respiratory distress.     Breath sounds: Rhonchi present.  Abdominal:     General: Abdomen is flat. Bowel sounds are normal.     Palpations: Abdomen is soft.  Musculoskeletal:        General: No swelling or tenderness. Normal range of motion.     Cervical back: Normal range of motion and neck supple.  Skin:    General: Skin is warm.     Capillary Refill: Capillary refill takes 2 to 3 seconds.     Coloration: Skin is not jaundiced.     Findings: No erythema.  Neurological:     General: No focal deficit present.     Mental Status: She is oriented to person, place, and time. Mental status is at baseline.  Psychiatric:        Mood and Affect: Mood normal.        Behavior: Behavior normal.        Thought Content: Thought content normal.   Condition at discharge: stable  The results of significant diagnostics from this hospitalization (including imaging, microbiology, ancillary and laboratory) are listed below for reference.   Imaging Studies: DG Chest Port 1 View Result Date: 08/13/2023 CLINICAL DATA:  Acute hypoxic respiratory failure. EXAM: PORTABLE CHEST 1 VIEW COMPARISON:  08/11/2023 FINDINGS: Endotracheal tube is roughly 2 cm above the carina. Right jugular central line is in lower SVC region. Nasogastric tube extends in the abdomen  but the tip is beyond the image. Slightly increased densities at both lung bases are concerning for pleural fluid and atelectasis. Again noted is some airspace disease in the right infrahilar region. Negative for a  pneumothorax. Heart size is within normal limits and stable. Atherosclerotic calcifications at the aortic arch. IMPRESSION: 1. Slightly increased densities at both lung bases. Findings are concerning for pleural fluid and atelectasis/consolidation. 2. Persistent airspace disease in the right infrahilar region. 3. Support apparatuses as described. Electronically Signed   By: Richarda Overlie M.D.   On: 08/13/2023 08:03   ECHOCARDIOGRAM COMPLETE Result Date: 08/11/2023    ECHOCARDIOGRAM REPORT   Patient Name:   ALETHEA DIANO Mulvey Date of Exam: 08/11/2023 Medical Rec #:  [id number]          Height:       58.3 in Accession #:    [id number]         Weight:       93.7 lb Date of Birth:  [birth year]          BSA:          1.324 m Patient Age:    72 years           BP:           97/48 mmHg Patient Gender: F                  HR:           108 bpm. Exam Location:  [facility] Procedure: 2D Echo, Cardiac Doppler and Color Doppler Indications:     Dyspnea  History:         Patient has prior history of Echocardiogram examinations, most                  recent 10/22/2022. CHF and Cardiomyopathy, Previous Myocardial                  Infarction, COPD and Stroke, Arrythmias:Atrial Fibrillation,                  Signs/Symptoms:Dyspnea and Shortness of Breath; Risk                  Factors:Hypertension, Sleep Apnea, Dyslipidemia and Diabetes.                  CKD, Breast CA.  Sonographer:     Mikki Harbor Referring Phys:  [id number] KHABIB DGAYLI Diagnosing Phys: Yvonne Kendall MD  Sonographer Comments: Technically difficult study due to poor echo windows and echo performed with patient supine and on artificial respirator. IMPRESSIONS  1. Left ventricular ejection fraction, by estimation, is 25 to 30%. The left ventricle has severely decreased function. The left ventricle demonstrates global hypokinesis. Left ventricular diastolic parameters are consistent with Grade I diastolic dysfunction (impaired relaxation).  2. Right ventricular systolic  function is low normal. The right ventricular size is normal. Tricuspid regurgitation signal is inadequate for assessing PA pressure.  3. The mitral valve is normal in structure. Trivial mitral valve regurgitation. No evidence of mitral stenosis.  4. The aortic valve was not well visualized. Aortic valve regurgitation is not visualized. No aortic stenosis is present. FINDINGS  Left Ventricle: Left ventricular ejection fraction, by estimation, is 25 to 30%. The left ventricle has severely decreased function. The left ventricle demonstrates global hypokinesis. The left ventricular internal cavity size was normal in size. There is no left ventricular hypertrophy. Left ventricular diastolic parameters are consistent with Grade I diastolic  dysfunction (impaired relaxation). Right Ventricle: The right ventricular size is normal. No increase in right ventricular wall thickness. Right ventricular systolic function is low normal. Tricuspid regurgitation signal is inadequate for assessing PA pressure. Left Atrium: Left atrial size was normal in size. Right Atrium: Right atrial size was normal in size. Pericardium: There is no evidence of pericardial effusion. Mitral Valve: The mitral valve is normal in structure. Trivial mitral valve regurgitation. No evidence of mitral valve stenosis. MV peak gradient, 5.7 mmHg. The mean mitral valve gradient is 3.0 mmHg. Tricuspid Valve: The tricuspid valve is normal in structure. Tricuspid valve regurgitation is trivial. Aortic Valve: The aortic valve was not well visualized. Aortic valve regurgitation is not visualized. No aortic stenosis is present. Aortic valve mean gradient measures 2.0 mmHg. Aortic valve peak gradient measures 6.7 mmHg. Aortic valve area, by VTI measures 2.45 cm. Pulmonic Valve: The pulmonic valve was not well visualized. Pulmonic valve regurgitation is not visualized. Aorta: The aortic root is normal in size and structure. Pulmonary Artery: The pulmonary artery is  not well seen. Venous: IVC assessment for right atrial pressure unable to be performed due to mechanical ventilation. IAS/Shunts: No atrial level shunt detected by color flow Doppler.  LEFT VENTRICLE PLAX 2D LVIDd:         5.10 cm   Diastology LVIDs:         3.70 cm   LV e' lateral:   9.68 cm/s LV PW:         0.80 cm   LV E/e' lateral: 8.1 LV IVS:        0.90 cm LVOT diam:     1.70 cm LV SV:         43 LV SV Index:   33 LVOT Area:     2.27 cm  RIGHT VENTRICLE RV Basal diam:  3.05 cm RV Mid diam:    2.50 cm LEFT ATRIUM             Index        RIGHT ATRIUM          Index LA diam:        3.30 cm 2.49 cm/m   RA Area:     9.39 cm LA Vol (A2C):   40.5 ml 30.59 ml/m  RA Volume:   19.50 ml 14.73 ml/m LA Vol (A4C):   19.1 ml 14.43 ml/m LA Biplane Vol: 28.0 ml 21.15 ml/m  AORTIC VALVE AV Area (Vmax):    2.20 cm AV Area (Vmean):   2.37 cm AV Area (VTI):     2.45 cm AV Vmax:           129.00 cm/s AV Vmean:          68.500 cm/s AV VTI:            0.177 m AV Peak Grad:      6.7 mmHg AV Mean Grad:      2.0 mmHg LVOT Vmax:         125.00 cm/s LVOT Vmean:        71.500 cm/s LVOT VTI:          0.191 m LVOT/AV VTI ratio: 1.08  AORTA Ao Root diam: 2.80 cm MITRAL VALVE MV Area (PHT): 8.43 cm    SHUNTS MV Area VTI:   2.05 cm    Systemic VTI:  0.19 m MV Peak grad:  5.7 mmHg    Systemic Diam: 1.70 cm MV Mean grad:  3.0 mmHg MV Vmax:  1.19 m/s MV Vmean:      71.9 cm/s MV Decel Time: 90 msec MV E velocity: 78.10 cm/s MV A velocity: 93.30 cm/s MV E/A ratio:  0.84 Yvonne Kendall MD Electronically signed by Yvonne Kendall MD Signature Date/Time: 08/11/2023/11:15:07 AM    Final    DG Abd 1 View Result Date: 08/11/2023 CLINICAL DATA:  Orogastric tube placement. EXAM: ABDOMEN - 1 VIEW COMPARISON:  None Available. FINDINGS: Distal tip of nasogastric tube is seen in expected position of distal stomach. No abnormal bowel dilatation. IMPRESSION: Distal tip of nasogastric tube seen in expected position of distal stomach.  Electronically Signed   By: Lupita Raider M.D.   On: 08/11/2023 10:01   DG Chest Port 1 View Result Date: 08/11/2023 CLINICAL DATA:  Endotracheal tube placement. EXAM: PORTABLE CHEST 1 VIEW COMPARISON:  Same day. FINDINGS: The heart size and mediastinal contours are within normal limits. Endotracheal and nasogastric tubes are unchanged. Right internal jugular catheter is again noted with tip in expected position of cavoatrial junction. Increased right basilar opacity is noted concerning for worsening pneumonia with small pleural effusion. The visualized skeletal structures are unremarkable. IMPRESSION: Stable support apparatus. Increased right basilar opacity as noted above. Electronically Signed   By: Lupita Raider M.D.   On: 08/11/2023 10:00   DG Chest Port 1 View Result Date: 08/11/2023 CLINICAL DATA:  Dyspnea EXAM: PORTABLE CHEST 1 VIEW COMPARISON:  Chest radiograph dated 08/09/2023 FINDINGS: Lines/tubes: Endotracheal tube tip projects 1.9 cm above the carina. Right internal jugular venous catheter tip projects over the superior cavoatrial junction. Enteric tube tip reaches the diaphragm and terminates below the field of view. Lungs: Patient is rotated to the left. Improved lung aeration with decreased bilateral interstitial opacities. Minimal bibasilar patchy opacities, right-greater-than-left remains. Pleura: Blunting of the right costophrenic angle.  No pneumothorax. Heart/mediastinum: Similar  cardiomediastinal silhouette. Bones: No acute osseous abnormality. IMPRESSION: 1. Improved lung aeration with decreased bilateral interstitial opacities. Minimal bibasilar patchy opacities, right-greater-than-left remains, likely a combination of atelectasis, aspiration, and pneumonia. 2. Blunting of the right costophrenic angle, which may represent a small pleural effusion. 3. Support apparatus as described. Electronically Signed   By: Agustin Cree M.D.   On: 08/11/2023 08:20   MR BRAIN W WO CONTRAST Result  Date: 08/09/2023 CLINICAL DATA:  Transient ischemic attack (TIA) Stroke, follow up Neuro deficit, acute, stroke suspected ? L pontine hypodensity on head CT EXAM: MRI HEAD WITHOUT AND WITH CONTRAST TECHNIQUE: Multiplanar, multiecho pulse sequences of the brain and surrounding structures were obtained without and with intravenous contrast. CONTRAST:  7mL GADAVIST GADOBUTROL 1 MMOL/ML IV SOLN COMPARISON:  Same-day head CT FINDINGS: Brain: Negative for acute infarct. No hemorrhage. No hydrocephalus. No extra-axial fluid collection. No mass effect. No mass lesion. There is a background of mild chronic microvascular ischemic change. No contrast-enhancing lesions visualized. Vascular: Normal flow voids. Skull and upper cervical spine: Normal marrow signal. Sinuses/Orbits: No middle ear effusion. Moderate left mastoid effusion. Paranasal sinuses are notable for mucosal thickening in the left sphenoid sinus. Bilateral lens replacement. Orbits are otherwise unremarkable. Other: None. IMPRESSION: No acute intracranial process. Electronically Signed   By: Lorenza Cambridge M.D.   On: 08/09/2023 16:10   DG Chest Port 1 View Result Date: 08/09/2023 CLINICAL DATA:  Status post central line placement. EXAM: PORTABLE CHEST 1 VIEW COMPARISON:  Chest radiograph performed earlier today. FINDINGS: The heart size and mediastinal contours are within normal limits. Vascular calcifications are seen in the aortic arch. Moderate diffuse  bilateral airspace opacities appear similar to prior exam. No pleural effusion or pneumothorax. A right internal jugular central venous catheter tip overlies the right atrium. An endotracheal tube terminates in the midthoracic trachea. An enteric tube enters the stomach and terminates below the field of view. IMPRESSION: 1. Right internal jugular central venous catheter tip overlies the right atrium. No pneumothorax. 2. Moderate diffuse bilateral airspace opacities appear similar to prior exam. Electronically  Signed   By: Romona Curls M.D.   On: 08/09/2023 12:41   CT Angio Chest Pulmonary Embolism (PE) W or WO Contrast Result Date: 08/09/2023 CLINICAL DATA:  73 year old female with history of shortness of breath and respiratory failure. Mental status change. Suspected pulmonary embolism. History of breast cancer. * Tracking Code: BO * EXAM: CT ANGIOGRAPHY CHEST CT ABDOMEN AND PELVIS WITH CONTRAST TECHNIQUE: Multidetector CT imaging of the chest was performed using the standard protocol during bolus administration of intravenous contrast. Multiplanar CT image reconstructions and MIPs were obtained to evaluate the vascular anatomy. Multidetector CT imaging of the abdomen and pelvis was performed using the standard protocol during bolus administration of intravenous contrast. RADIATION DOSE REDUCTION: This exam was performed according to the departmental dose-optimization program which includes automated exposure control, adjustment of the mA and/or kV according to patient size and/or use of iterative reconstruction technique. CONTRAST:  75mL OMNIPAQUE IOHEXOL 350 MG/ML SOLN COMPARISON:  Chest CTA 12/11/2019. CT of the abdomen and pelvis 04/16/2023. FINDINGS: Comment: Portions of today's examination are limited by patient motion. CTA CHEST FINDINGS Cardiovascular: Small subsegmental sized filling defects are noted in pulmonary artery branches to the right lower lobe (axial image 234 of series 4) and the left lower lobe (axial image 236 of series 4), which appear nonocclusive. No larger more centrally located pulmonary emboli are identified. Heart size is borderline enlarged. There is no significant pericardial fluid, thickening or pericardial calcification. There is aortic atherosclerosis, as well as atherosclerosis of the great vessels of the mediastinum and the coronary arteries, including calcified atherosclerotic plaque in the left main and left circumflex coronary arteries. Mediastinum/Nodes: Patient is intubated,  with the tip of the endotracheal tube 4.1 cm above the carina. No pathologically enlarged mediastinal or hilar lymph nodes. Nasogastric tube extending into the stomach. Esophagus is otherwise unremarkable in appearance. No axillary lymphadenopathy. Lungs/Pleura: Extensive multifocal airspace consolidation noted throughout the lungs bilaterally, predominantly in the dependent portion of the lungs, concerning for aspiration pneumonia. Small bilateral pleural effusions lying dependently. Partially calcified mass-like area of architectural distortion in the apex of the right hemithorax, similar to the prior study, presumably an area of chronic postinfectious or inflammatory scarring. No other definite suspicious appearing pulmonary nodules or masses are confidently identified. Musculoskeletal: Status post right modified radical mastectomy. There are no aggressive appearing lytic or blastic lesions noted in the visualized portions of the skeleton. Review of the MIP images confirms the above findings. CT ABDOMEN and PELVIS FINDINGS Hepatobiliary: Diffuse periportal edema in the liver. No definite suspicious cystic or solid hepatic lesions. No intra or extrahepatic biliary ductal dilatation. Gallbladder is nearly completely decompressed, but otherwise unremarkable in appearance. Pancreas: No definite pancreatic mass or peripancreatic fluid collections or inflammatory changes. Spleen: Unremarkable. Adrenals/Urinary Tract: Low-attenuation lesions in both kidneys compatible with simple cysts, largest of which is exophytic extending off the posterolateral aspect of the upper pole of the right kidney measuring up to 4.7 cm in diameter (no imaging follow-up recommended). No aggressive appearing renal lesions. No hydroureteronephrosis. Urinary bladder is decompressed with an indwelling  Foley balloon catheter in place and largely obscured by beam hardening artifact from the patient's left hip arthroplasty. Bilateral adrenal glands  are normal in appearance. Stomach/Bowel: Nasogastric tube extending into the antral region of the stomach. Stomach is otherwise unremarkable in appearance. No pathologic dilatation of small bowel or colon. Scattered colonic diverticuli are noted, without focal surrounding inflammatory changes to indicate an acute diverticulitis at this time. Normal appendix. Vascular/Lymphatic: Atherosclerosis in the abdominal aorta and pelvic vasculature. No lymphadenopathy noted in the abdomen or pelvis. Reproductive: Status post hysterectomy. Ovaries are not confidently identified may be surgically absent or atrophic. Other: No significant volume of ascites.  No pneumoperitoneum. Musculoskeletal: Status post left hip arthroplasty. There are no aggressive appearing lytic or blastic lesions noted in the visualized portions of the skeleton. Review of the MIP images confirms the above findings. IMPRESSION: 1. Study is positive for small subsegmental sized pulmonary emboli in the lungs bilaterally. 2. Severe multilobar bilateral pneumonia predominantly dependently located in the lungs bilaterally, concerning for aspiration pneumonia. 3. Small bilateral parapneumonic pleural effusions lying dependently, simple in appearance at this time. 4. Extensive periportal edema in the liver. This is a nonspecific finding. Correlation with liver function tests is recommended. 5. Colonic diverticulosis without evidence of acute diverticulitis at this time. 6. Aortic atherosclerosis, in addition to left main and circumflex coronary artery disease. Assessment for potential risk factor modification, dietary therapy or pharmacologic therapy may be warranted, if clinically indicated. 7. Additional incidental findings, as above. Electronically Signed   By: Trudie Reed M.D.   On: 08/09/2023 09:01   CT ABDOMEN PELVIS W CONTRAST Result Date: 08/09/2023 CLINICAL DATA:  73 year old female with history of shortness of breath and respiratory failure.  Mental status change. Suspected pulmonary embolism. History of breast cancer. * Tracking Code: BO * EXAM: CT ANGIOGRAPHY CHEST CT ABDOMEN AND PELVIS WITH CONTRAST TECHNIQUE: Multidetector CT imaging of the chest was performed using the standard protocol during bolus administration of intravenous contrast. Multiplanar CT image reconstructions and MIPs were obtained to evaluate the vascular anatomy. Multidetector CT imaging of the abdomen and pelvis was performed using the standard protocol during bolus administration of intravenous contrast. RADIATION DOSE REDUCTION: This exam was performed according to the departmental dose-optimization program which includes automated exposure control, adjustment of the mA and/or kV according to patient size and/or use of iterative reconstruction technique. CONTRAST:  75mL OMNIPAQUE IOHEXOL 350 MG/ML SOLN COMPARISON:  Chest CTA 12/11/2019. CT of the abdomen and pelvis 04/16/2023. FINDINGS: Comment: Portions of today's examination are limited by patient motion. CTA CHEST FINDINGS Cardiovascular: Small subsegmental sized filling defects are noted in pulmonary artery branches to the right lower lobe (axial image 234 of series 4) and the left lower lobe (axial image 236 of series 4), which appear nonocclusive. No larger more centrally located pulmonary emboli are identified. Heart size is borderline enlarged. There is no significant pericardial fluid, thickening or pericardial calcification. There is aortic atherosclerosis, as well as atherosclerosis of the great vessels of the mediastinum and the coronary arteries, including calcified atherosclerotic plaque in the left main and left circumflex coronary arteries. Mediastinum/Nodes: Patient is intubated, with the tip of the endotracheal tube 4.1 cm above the carina. No pathologically enlarged mediastinal or hilar lymph nodes. Nasogastric tube extending into the stomach. Esophagus is otherwise unremarkable in appearance. No axillary  lymphadenopathy. Lungs/Pleura: Extensive multifocal airspace consolidation noted throughout the lungs bilaterally, predominantly in the dependent portion of the lungs, concerning for aspiration pneumonia. Small bilateral pleural effusions lying dependently.  Partially calcified mass-like area of architectural distortion in the apex of the right hemithorax, similar to the prior study, presumably an area of chronic postinfectious or inflammatory scarring. No other definite suspicious appearing pulmonary nodules or masses are confidently identified. Musculoskeletal: Status post right modified radical mastectomy. There are no aggressive appearing lytic or blastic lesions noted in the visualized portions of the skeleton. Review of the MIP images confirms the above findings. CT ABDOMEN and PELVIS FINDINGS Hepatobiliary: Diffuse periportal edema in the liver. No definite suspicious cystic or solid hepatic lesions. No intra or extrahepatic biliary ductal dilatation. Gallbladder is nearly completely decompressed, but otherwise unremarkable in appearance. Pancreas: No definite pancreatic mass or peripancreatic fluid collections or inflammatory changes. Spleen: Unremarkable. Adrenals/Urinary Tract: Low-attenuation lesions in both kidneys compatible with simple cysts, largest of which is exophytic extending off the posterolateral aspect of the upper pole of the right kidney measuring up to 4.7 cm in diameter (no imaging follow-up recommended). No aggressive appearing renal lesions. No hydroureteronephrosis. Urinary bladder is decompressed with an indwelling Foley balloon catheter in place and largely obscured by beam hardening artifact from the patient's left hip arthroplasty. Bilateral adrenal glands are normal in appearance. Stomach/Bowel: Nasogastric tube extending into the antral region of the stomach. Stomach is otherwise unremarkable in appearance. No pathologic dilatation of small bowel or colon. Scattered colonic  diverticuli are noted, without focal surrounding inflammatory changes to indicate an acute diverticulitis at this time. Normal appendix. Vascular/Lymphatic: Atherosclerosis in the abdominal aorta and pelvic vasculature. No lymphadenopathy noted in the abdomen or pelvis. Reproductive: Status post hysterectomy. Ovaries are not confidently identified may be surgically absent or atrophic. Other: No significant volume of ascites.  No pneumoperitoneum. Musculoskeletal: Status post left hip arthroplasty. There are no aggressive appearing lytic or blastic lesions noted in the visualized portions of the skeleton. Review of the MIP images confirms the above findings. IMPRESSION: 1. Study is positive for small subsegmental sized pulmonary emboli in the lungs bilaterally. 2. Severe multilobar bilateral pneumonia predominantly dependently located in the lungs bilaterally, concerning for aspiration pneumonia. 3. Small bilateral parapneumonic pleural effusions lying dependently, simple in appearance at this time. 4. Extensive periportal edema in the liver. This is a nonspecific finding. Correlation with liver function tests is recommended. 5. Colonic diverticulosis without evidence of acute diverticulitis at this time. 6. Aortic atherosclerosis, in addition to left main and circumflex coronary artery disease. Assessment for potential risk factor modification, dietary therapy or pharmacologic therapy may be warranted, if clinically indicated. 7. Additional incidental findings, as above. Electronically Signed   By: Trudie Reed M.D.   On: 08/09/2023 09:01   CT HEAD WO CONTRAST ( ) Result Date: 08/09/2023 CLINICAL DATA:  Mental status change, persistent or worsening. EXAM: CT HEAD WITHOUT CONTRAST TECHNIQUE: Contiguous axial images were obtained from the base of the skull through the vertex without intravenous contrast. RADIATION DOSE REDUCTION: This exam was performed according to the departmental dose-optimization program  which includes automated exposure control, adjustment of the mA and/or kV according to patient size and/or use of iterative reconstruction technique. COMPARISON:  05/07/2021 FINDINGS: Brain: Low-density at the left upper pons not seen on prior. Convincing appearance although this area is commonly affected by artifact. No acute gray matter infarct seen. No hemorrhage, hydrocephalus, or collection. Vascular: No hyperdense vessel or unexpected calcification. Skull: Normal. Negative for fracture or focal lesion. Sinuses/Orbits: No acute finding. IMPRESSION: Equivocal low-density in the left pons which could be a perforator infarct. Otherwise stable from 2023. Electronically Signed  By: Tiburcio Pea M.D.   On: 08/09/2023 08:43   DG Abd Portable 1 View Result Date: 08/09/2023 CLINICAL DATA:  OG tube placement EXAM: PORTABLE ABDOMEN - 1 VIEW COMPARISON:  None Available. FINDINGS: OG tube is in the stomach. Diffuse bilateral airspace disease seen in the visualized lower lungs. IMPRESSION: OG tube in the stomach. Electronically Signed   By: Charlett Nose M.D.   On: 08/09/2023 02:52   DG Chest Port 1 View Result Date: 08/09/2023 CLINICAL DATA:  Post intubation, OG tube placement EXAM: PORTABLE CHEST 1 VIEW COMPARISON:  07/23/2023 FINDINGS: Endotracheal tube is 3.3 cm above the carina. OG tube enters the stomach. Heart and mediastinal contours are within normal limits. Diffuse bilateral airspace disease. No effusions or pneumothorax. No acute bony abnormality. IMPRESSION: Support devices in expected position. Diffuse bilateral airspace disease could reflect edema or infection. Electronically Signed   By: Charlett Nose M.D.   On: 08/09/2023 02:52   DG Chest 2 View Result Date: 07/23/2023 CLINICAL DATA:  Shortness of breath starting yesterday. History of COPD. EXAM: CHEST - 2 VIEW COMPARISON:  AP chest 04/16/2023 and 01/05/2023 FINDINGS: Cardiac silhouette and mediastinal contours are within normal limits. The patient  is mildly rightward rotated. Mild chronic bilateral interstitial thickening is similar to prior. No acute airspace opacity is seen. No pleural effusion pneumothorax. Moderate multilevel degenerative disc changes of the thoracic spine. IMPRESSION: 1. No active cardiopulmonary disease. 2. Mild chronic bilateral interstitial thickening. Electronically Signed   By: Neita Garnet M.D.   On: 07/23/2023 19:51    Microbiology: Results for orders placed or performed during the hospital encounter of 08/09/23  Resp panel by RT-PCR (RSV, Flu A&B, Covid) Anterior Nasal Swab     Status: None   Collection Time: 08/09/23  2:14 AM   Specimen: Anterior Nasal Swab  Result Value Ref Range Status   SARS Coronavirus 2 by RT PCR NEGATIVE NEGATIVE Final    Comment: (NOTE) SARS-CoV-2 target nucleic acids are NOT DETECTED.  The SARS-CoV-2 RNA is generally detectable in upper respiratory specimens during the acute phase of infection. The lowest concentration of SARS-CoV-2 viral copies this assay can detect is 138 copies/mL. A negative result does not preclude SARS-Cov-2 infection and should not be used as the sole basis for treatment or other patient management decisions. A negative result may occur with  improper specimen collection/handling, submission of specimen other than nasopharyngeal swab, presence of viral mutation(s) within the areas targeted by this assay, and inadequate number of viral copies(<138 copies/mL). A negative result must be combined with clinical observations, patient history, and epidemiological information. The expected result is Negative.  Fact Sheet for Patients:  BloggerCourse.com  Fact Sheet for Healthcare Providers:  SeriousBroker.it  This test is no t yet approved or cleared by the [place] FDA and  has been authorized for detection and/or diagnosis of SARS-CoV-2 by FDA under an Emergency Use Authorization (EUA). This EUA  will remain  in effect (meaning this test can be used) for the duration of the COVID-19 declaration under Section 564(b)(1) of the Act, 21 U.S.C.section 360bbb-3(b)(1), unless the authorization is terminated  or revoked sooner.       Influenza A by PCR NEGATIVE NEGATIVE Final   Influenza B by PCR NEGATIVE NEGATIVE Final    Comment: (NOTE) The Xpert Xpress SARS-CoV-2/FLU/RSV plus assay is intended as an aid in the diagnosis of influenza from Nasopharyngeal swab specimens and should not be used as a sole basis for treatment. Nasal washings and  aspirates are unacceptable for Xpert Xpress SARS-CoV-2/FLU/RSV testing.  Fact Sheet for Patients: BloggerCourse.com  Fact Sheet for Healthcare Providers: SeriousBroker.it  This test is not yet approved or cleared by the [place] FDA and has been authorized for detection and/or diagnosis of SARS-CoV-2 by FDA under an Emergency Use Authorization (EUA). This EUA will remain in effect (meaning this test can be used) for the duration of the COVID-19 declaration under Section 564(b)(1) of the Act, 21 U.S.C. section 360bbb-3(b)(1), unless the authorization is terminated or revoked.     Resp Syncytial Virus by PCR NEGATIVE NEGATIVE Final    Comment: (NOTE) Fact Sheet for Patients: BloggerCourse.com  Fact Sheet for Healthcare Providers: SeriousBroker.it  This test is not yet approved or cleared by the [place] FDA and has been authorized for detection and/or diagnosis of SARS-CoV-2 by FDA under an Emergency Use Authorization (EUA). This EUA will remain in effect (meaning this test can be used) for the duration of the COVID-19 declaration under Section 564(b)(1) of the Act, 21 U.S.C. section 360bbb-3(b)(1), unless the authorization is terminated or revoked.  Performed at [facility], [address] [place], [place]  [address]   Respiratory (~20 pathogens) panel by PCR     Status: None   Collection Time: 08/09/23  2:14 AM   Specimen: Nasopharyngeal Swab; Respiratory  Result Value Ref Range Status   Adenovirus NOT DETECTED NOT DETECTED Final   Coronavirus 229E NOT DETECTED NOT DETECTED Final    Comment: (NOTE) The Coronavirus on the Respiratory Panel, DOES NOT test for the novel  Coronavirus (2019 nCoV)    Coronavirus HKU1 NOT DETECTED NOT DETECTED Final   Coronavirus NL63 NOT DETECTED NOT DETECTED Final   Coronavirus OC43 NOT DETECTED NOT DETECTED Final   Metapneumovirus NOT DETECTED NOT DETECTED Final   Rhinovirus / Enterovirus NOT DETECTED NOT DETECTED Final   Influenza A NOT DETECTED NOT DETECTED Final   Influenza B NOT DETECTED NOT DETECTED Final   Parainfluenza Virus 1 NOT DETECTED NOT DETECTED Final   Parainfluenza Virus 2 NOT DETECTED NOT DETECTED Final   Parainfluenza Virus 3 NOT DETECTED NOT DETECTED Final   Parainfluenza Virus 4 NOT DETECTED NOT DETECTED Final   Respiratory Syncytial Virus NOT DETECTED NOT DETECTED Final   Bordetella pertussis NOT DETECTED NOT DETECTED Final   Bordetella Parapertussis NOT DETECTED NOT DETECTED Final   Chlamydophila pneumoniae NOT DETECTED NOT DETECTED Final   Mycoplasma pneumoniae NOT DETECTED NOT DETECTED Final    Comment: Performed at [facility], [address] [place], [place] [address]  Blood Culture (routine x 2)     Status: None   Collection Time: 08/09/23  3:10 AM   Specimen: BLOOD  Result Value Ref Range Status   Specimen Description BLOOD LEFT FA  Final   Special Requests   Final    BOTTLES DRAWN AEROBIC AND ANAEROBIC Blood Culture results may not be optimal due to an inadequate volume of blood received in culture bottles   Culture   Final    NO GROWTH 5 DAYS Performed at [facility], [address], [place], [place] [address]    Report Status 08/14/2023 FINAL  Final  MRSA Next Gen by PCR, Nasal     Status: None    Collection Time: 08/09/23  4:15 AM   Specimen: Nasal Swab  Result Value Ref Range Status   MRSA by PCR Next Gen NOT DETECTED NOT DETECTED Final    Comment: (NOTE) The GeneXpert MRSA Assay (FDA  approved for NASAL specimens only), is one component of a comprehensive MRSA colonization surveillance program. It is not intended to diagnose MRSA infection nor to guide or monitor treatment for MRSA infections. Test performance is not FDA approved in patients less than 32 years old. Performed at [facility], [address] [place], [place] [address]   Blood Culture (routine x 2)     Status: None   Collection Time: 08/09/23  4:56 AM   Specimen: BLOOD  Result Value Ref Range Status   Specimen Description BLOOD RIGHT ARM  Final   Special Requests   Final    BOTTLES DRAWN AEROBIC AND ANAEROBIC Blood Culture adequate volume   Culture   Final    NO GROWTH 5 DAYS Performed at [facility], [address], [place], [place] [address]    Report Status 08/14/2023 FINAL  Final  Culture, Respiratory w Gram Stain     Status: None   Collection Time: 08/11/23 11:33 AM   Specimen: Tracheal Aspirate; Respiratory  Result Value Ref Range Status   Specimen Description   Final    TRACHEAL ASPIRATE Performed at [facility], [address], [place], [place] [address]    Special Requests   Final    NONE Performed at [facility], [address] [place], [place] [address]    Gram Stain   Final    RARE WBC PRESENT, PREDOMINANTLY PMN NO ORGANISMS SEEN Performed at [facility], [address], [place], [place] [address]    Culture FEW CANDIDA ALBICANS  Final   Report Status 08/14/2023 FINAL  Final    Labs: CBC: Recent Labs  Lab 08/17/23 0424 08/18/23 0433 08/19/23 1014 08/21/23 0627 08/22/23 0453  WBC 14.6* 13.5* 11.9* 9.5 10.7*  HGB 8.7* 10.0* 9.0* 7.7* 7.7*  HCT 27.4* 32.3* 29.5* 25.2* 24.4*  MCV 89.8 89.2 92.2 91.0 88.1  PLT 316 460* 475*  528* 514*   Basic Metabolic Panel: Recent Labs  Lab 08/16/23 0546 08/17/23 0424 08/19/23 1014 08/21/23 0627  NA 138 141 136 139  K 3.3* 4.0 3.9 4.0  CL 101 100 93* 97*  CO2 29 30 30  33*  GLUCOSE 187* 113* 237* 108*  BUN 31* 23 12 10   CREATININE 0.69 0.85 0.67 0.65  CALCIUM 8.2* 8.6* 8.7* 8.8*  MG  --   --  2.0  --   PHOS  --   --  3.7  --    Liver Function Tests: No results for input(s): "AST", "ALT", "ALKPHOS", "BILITOT", "PROT", "ALBUMIN" in the last 168 hours.  CBG: Recent Labs  Lab 08/21/23 1057 08/21/23 1755 08/21/23 1945 08/22/23 0813 08/22/23 1134  GLUCAP 298* 55* 216* 131* 173*    Discharge time spent: greater than 30 minutes.  Signed: Ernestene Mention, MD [facility] 08/22/2023

## 2023-08-21 NOTE — Progress Notes (Signed)
Nutrition Follow-up  DOCUMENTATION CODES:   Not applicable  INTERVENTION:   -Continue with liberalized diet of regular for widest variety of meal selections -Continue MVI with minerals daily -Continue Ensure Enlive po BID, each supplement provides 350 kcal and 20 grams of protein  NUTRITION DIAGNOSIS:   Inadequate oral intake related to inability to eat as evidenced by NPO status.  Progressing advanced to PO diet on 08/16/23  GOAL:   Patient will meet greater than or equal to 90% of their needs  Progressing   MONITOR:   PO intake, Supplement acceptance, Labs, Weight trends, I & O's, Skin  REASON FOR ASSESSMENT:   Ventilator    ASSESSMENT:   73 y/o female with h/o IBS, OSA, DDD, chronic pain with opioid dependence s/p intrathecal pump, B12 deficiency, CHF, Afib, breast cancer (s/p mastectomy/chemoradiation 1998), GERD, COPD, MDD, DM, CKD III, HLD, HTN, NSTEMI, PE and stroke who is admitted with CAP, small bilateral subsegmental PE's, shock and AKI.  1/11- extubated 1/12- s/p BSE- dysphagia 1 diet  1/13- s/p BSE- dysphagia 2 diet  1/16- s/p BSE- advanced to regular diet  Reviewed I/O's: +720 ml x 24 hours and +2.3 L since admission  Spoke with pt and daughter at bedside. She reports feeling better today and is eager to discharge from the hospital to start rehab. Pt reports good tolerance of regular diet and is happy that she has wider variety of food selections. Per pt, her appetite comes and goes and feel frustrated that she sometimes does not "have the taste" for food although she liked what she ordered. Noted meal completions 25-60%. Pt consumed a few bites of pancakes, 100% of cereal, and about 1/3 of Ensure supplement. Pt enjoys strawberry supplements and is amenable to continue those. Discussed importance of good meal and supplement intake to promote healing, especially how this will meet her goals during SNF. Pt and daughter expressed appreciation for visit.    Medications reviewed and include lasix, neurontin.   Wt has been stable over the past week.   Per TOC notes, plan for SNF placement at discharge ([facility]).   Labs reviewed: CBGS: 105-232 (inpatient orders for glycemic control are 0-20 units insulin aspart TID with before meals and at bedtime and 12 units insulin glarigne-yfgn daily).    Diet Order:   Diet Order             Diet regular Room service appropriate? Yes; Fluid consistency: Thin  Diet effective now                   EDUCATION NEEDS:   Not appropriate for education at this time  Skin:  Skin Assessment: Reviewed RN Assessment  Last BM:  08/19/23 (type 6)  Height:   Ht Readings from Last 1 Encounters:  08/09/23 4' 10.27" (1.48 m)    Weight:   Wt Readings from Last 1 Encounters:  08/21/23 69.5 kg    Ideal Body Weight:  43.9 kg  BMI:  Body mass index is 31.73 kg/m.  Estimated Nutritional Needs:   Kcal:  1550-1750  Protein:  85-100 grams  Fluid:  > 1.5 L    Levada Schilling, RD, LDN, CDCES Registered Dietitian III Certified Diabetes Care and Education Specialist If unable to reach this RD, please use "RD Inpatient" group chat on secure chat between hours of 8am-4 pm daily

## 2023-08-21 NOTE — Progress Notes (Signed)
Physical Therapy Treatment Patient Details Name: Jordan Chan MRN: [id number] DOB: 1951/04/18 Today's Date: 08/21/2023   History of Present Illness Pt is a 73 y/o F admitted on 08/09/23 after presenting with c/o respiratory distress, O2 sats in the 50s-60s on room air. Pt admitted to ICU for tx of acute hypoxic hypercapnic respiratory failure 2/2 suspected PNA & AECOPD, pt intubated but successfully extubated on 08/15/23. Pt also found to have acute PE during admission. PMH: B12 deficiency, chronic pain syndrome s/p intrathecal pump, paroxysmal a-fib, stress-induced cardiomyopathy, PE, DM, COPD on nocturnal O2, GERD, OSA, IBS, CVA    PT Comments  Pt was pleasant and motivated to participate during the session and put forth good effort throughout. Pt required no physical assistance during the session and presented with good eccentric and concentric control and stability during sit to/from stand transfers from various height surfaces with no need of UE assist.  Pt able to amb 10 feet and then 30 feet with start/stops and sharp turns with no overt LOB and with pt reporting effort of 30 foot walk as "medium hard" with SpO2 >/= 91% and HR WNL throughout on 2L.  Multiple family members in the room during session with pt and family in agreement that pt would have adequate 24/7 supervision between family and supportive friends to return home safely and ensure supervision with all OOB activities and assistance for chores, meals, etc.. as needed.  Pt will benefit from continued PT services upon discharge to safely address deficits listed in patient problem list for decreased caregiver assistance and eventual return to PLOF.      If plan is discharge home, recommend the following: A little help with walking and/or transfers;A little help with bathing/dressing/bathroom;Assist for transportation;Help with stairs or ramp for entrance;Assistance with cooking/housework   Can travel by private vehicle     Yes   Equipment Recommendations  None recommended by PT    Recommendations for Other Services       Precautions / Restrictions Precautions Precautions: Fall Restrictions Weight Bearing Restrictions Per Provider Order: No     Mobility  Bed Mobility Overal bed mobility: Needs Assistance       Supine to sit: Supervision     General bed mobility comments: Min extra time and effort only    Transfers Overall transfer level: Needs assistance Equipment used: Rolling walker (2 wheels) Transfers: Sit to/from Stand Sit to Stand: Supervision           General transfer comment: Good eccentric and concentric control and stability with no use of UE asssist from various height surfaces    Ambulation/Gait Ambulation/Gait assistance: Contact guard assist Gait Distance (Feet): 10 Feet x 1, 30 Feet x 1 Assistive device: Rolling walker (2 wheels) Gait Pattern/deviations: Step-through pattern, Decreased step length - right, Decreased step length - left, Trunk flexed Gait velocity: decreased     General Gait Details: Slow cadence but steady throughout with no overt LOB including during sharp 180 deg turns and navigating tight spaces   Stairs             Wheelchair Mobility     Tilt Bed    Modified Rankin (Stroke Patients Only)       Balance Overall balance assessment: Needs assistance Sitting-balance support: Feet supported Sitting balance-Leahy Scale: Good     Standing balance support: During functional activity, Bilateral upper extremity supported Standing balance-Leahy Scale: Good  Cognition Arousal: Alert Behavior During Therapy: WFL for tasks assessed/performed Overall Cognitive Status: Within Functional Limits for tasks assessed                                          Exercises Total Joint Exercises Ankle Circles/Pumps: Strengthening, AROM, Both, 10 reps Quad Sets: Strengthening, Both, 10  reps Gluteal Sets: Strengthening, Both, 10 reps Other Exercises Other Exercises: HEP education for BLE APs, QS, and GS x 10 each every 1-2 hours daily Other Exercises: Pt education provided on breathing strategies to prevent valsalva during therex    General Comments General comments (skin integrity, edema, etc.): SpO2 94% on 3L [place]      Pertinent Vitals/Pain Pain Assessment Pain Assessment: 0-10 Pain Score: 4  Pain Location: back Pain Descriptors / Indicators: Sore Pain Intervention(s): Repositioned, Premedicated before session, Monitored during session    Home Living                          Prior Function            PT Goals (current goals can now be found in the care plan section) Progress towards PT goals: Progressing toward goals    Frequency    Min 1X/week      PT Plan      Co-evaluation              AM-PAC PT "6 Clicks" Mobility   Outcome Measure  Help needed turning from your back to your side while in a flat bed without using bedrails?: A Little Help needed moving from lying on your back to sitting on the side of a flat bed without using bedrails?: A Little Help needed moving to and from a bed to a chair (including a wheelchair)?: A Little Help needed standing up from a chair using your arms (e.g., wheelchair or bedside chair)?: A Little Help needed to walk in hospital room?: A Little Help needed climbing 3-5 steps with a railing? : A Little 6 Click Score: 18    End of Session Equipment Utilized During Treatment: Oxygen Activity Tolerance: Patient tolerated treatment well Patient left: with chair alarm set;in chair;with call bell/phone within reach;with family/visitor present Nurse Communication: Mobility status PT Visit Diagnosis: Muscle weakness (generalized) (M62.81);Difficulty in walking, not elsewhere classified (R26.2);Other abnormalities of gait and mobility (R26.89);Unsteadiness on feet (R26.81)     Time: 8413-2440 PT Time  Calculation (min) (ACUTE ONLY): 28 min  Charges:    $Gait Training: 8-22 mins $Therapeutic Exercise: 8-22 mins PT General Charges $$ ACUTE PT VISIT: 1 Visit                    D. Scott Ellarose Brandi PT, DPT 08/21/23, 4:14 PM

## 2023-08-21 NOTE — TOC Progression Note (Addendum)
Transition of Care [facility]) - Progression Note    Patient Details  Name: Jordan Chan MRN: [id number] Date of Birth: [birth year]  Transition of Care [facility]) CM/SW Contact  Chapman Fitch, RN Phone Number: 08/21/2023, 4:04 PM  Clinical Narrative:     Therapy upgraded recs to home health.  Patient confirms she would prefer to go home instead.  She prefers [facility].  Referral made to [place] with [facility] Per PT no DME needs  Updated [facility] at [facility]   If patient needs continuous o2 will need qualify sats and order.  Patient currently has [facility]urnal with [facility]   Expected Discharge Plan: Skilled Nursing Facility Barriers to Discharge: Continued Medical Work up  Expected Discharge Plan and Services     Post Acute Care Choice:  (TBD) Living arrangements for the past 2 months: Single Family Home                                       Social Determinants of Health (SDOH) Interventions SDOH Screenings   Food Insecurity: No Food Insecurity (04/16/2023)  Housing: Unknown (08/20/2023)  Transportation Needs: No Transportation Needs (08/20/2023)  Utilities: Patient Unable To Answer (08/09/2023)  Alcohol Screen: Low Risk  (08/20/2023)  Depression (PHQ2-9): Low Risk  (08/04/2023)  Recent Concern: Depression (PHQ2-9) - Medium Risk (07/27/2023)  Financial Resource Strain: Low Risk  (08/20/2023)  Physical Activity: Inactive (12/23/2022)  Social Connections: Patient Unable To Answer (08/09/2023)  Stress: No Stress Concern Present (12/23/2022)  Tobacco Use: Medium Risk (08/20/2023)    Readmission Risk Interventions    08/18/2023    2:50 PM  Readmission Risk Prevention Plan  Transportation Screening Complete  Medication Review (RN Care Manager) Complete  PCP or Specialist appointment within 3-5 days of discharge Complete  SW Recovery Care/Counseling Consult Complete  Palliative Care Screening Not Applicable  Skilled Nursing Facility Complete

## 2023-08-21 NOTE — Progress Notes (Signed)
SLP Cancellation Note  Patient Details Name: Jordan Chan MRN: [id number] DOB: 1951-01-08   Cancelled treatment:       Reason Eval/Treat Not Completed:  (chart reviewed; consulted NSG then met w/ pt and Dtr in room.)  Pt and Daughter present in room; pt resting in bed. Pt A/O x4 and verbally conversed w/ this SLP. Pt denied any difficulty swallowing w/ current Regular diet, thin liquids. She stated she is eating at each meal -- "I had spaghetti 2x in 2 days d/t liking it so much". Both Dtr and NSG endorsed the same denying seeing any swallowing deficits w/ the oral diet.  Reviewed education on general aspiration precautions; pt agreed. ST services will sign off at this time. MD to reconsult if any new needs arise during admit.     Jerilynn Som, MS, CCC-SLP Speech Language Pathologist Rehab Services; [facility] [phone] (ascom) Cinzia Devos 08/21/2023, 3:55 PM

## 2023-08-21 NOTE — Progress Notes (Signed)
Progress Note   Patient: Jordan Chan ZOX:[id number] DOB: 06-05-1951 DOA: 08/09/2023     12 DOS: the patient was seen and examined on 08/21/2023   Brief hospital course: 73 y.o female with significant PMH of B12 deficiency, chronic pain syndrome s/p intrathecal pump, paroxysmal atrial fibrillation, stress-induced cardiomyopathy in the setting of pain pump malfunction and opioid withdrawal, PE following hospitalization for pneumonia, DM, COPD, GERD, OSA, and IBS who presented to the ED with chief complaints of acute respiratory distress found with sats in the 50 to 60% on RA per EMS.  Respiratory failure, likely CHF and bilateral pneumonia. Extubated 08/15/23 and did very well.   Assessment and Plan: Acute on chronic respiratory failure with hypoxia and hypercapnia (HCC) secondary to Multiple subsegmental pulmonary emboli without acute cor pulmonale and Sepsis due to Aspiration pneumonia (HCC) Leukocytosis improved At home on 2LPM O2 [place] at night time. Currently requiring 2LPM O2 [place] at rest  Aspiration pneumonia: completed abx course. Continue on bronchodilators & encourage incentive spirometry   Underwent swallow eval--> Regular;Thin liquid (with cut meats) -> diet orders changed Perform IS Q4hrs while awake  Toxic metabolic encephalopathy: almost improved likely multifactorial- hospital/ICU delirium, narcotic use from chronic pain, infection. Much improved. Re-orient prn. Continue w/ supportive care    Diabetes mellitus: Mildly uncontrolled, Cont Lantus 12 units daily, sliding scale insulin as needed, Follow fingersticks  Generalized weakness: PT recs SNF    Chronic pain: continue on home dose of gabapentin   Circulatory shock: see Dr. Doreene Adas notes on how met circulatory shock criteria. Resolved   PAF: HR in 90s Continue eliquis.  Cont metoprolol at 12.5mg  BID   Chronic systolic CHF: resume lasix 20mg  daily  Monitor I/Os.  Continue losartan low dose Continue metoprolol    Acute hypoxic respiratory failure: continue on supplemental oxygen and wean as tolerated   PE: d/ced IV heparin.  Cont eliquis    B/l pleural effusions: cont lasix 20mg  daily    AKI: resolved    Depression: severity unknown. Continue on home dose of buspar, venlafaxine   Anemia: Hb at 7.7--> Denies any melena/ blood in stools.  It appears that her baseline Hb is around 11 based on chart review Obtain Iron studies, folate, B12 Stool for Occult blood Repeat CBC in AM  Physical debility: PT Visit Diagnosis: Muscle weakness (generalized) (M62.81);Difficulty in walking, not elsewhere classified (R26.2);Other abnormalities of gait and mobility (R26.89);Unsteadiness on feet  Awaiting for SNF placement, CM/SW following     Subjective: Shortness of breath is improving Denies fever, chills, chest pain Denies headache, dizziness, abdominal symptoms Denies any melena/ blood in stools.   Physical Exam: Vitals:   08/20/23 2047 08/21/23 0435 08/21/23 0449 08/21/23 0804  BP:  (!) 101/47  (!) 114/56  Pulse:  94  99  Resp:    19  Temp:  98.3 F (36.8 C)  99 F (37.2 C)  TempSrc:  Oral  Oral  SpO2: 95% 94%  93%  Weight:   69.5 kg   Height:      Physical Exam Constitutional:      General: She is not in acute distress.    Appearance: Normal appearance.  HENT:     Head: Normocephalic and atraumatic.     Nose: Nose normal. No congestion.     Mouth/Throat:     Mouth: Mucous membranes are moist.     Pharynx: Oropharynx is clear. No oropharyngeal exudate.  Eyes:     Extraocular Movements: Extraocular movements intact.  Conjunctiva/sclera: Conjunctivae normal.  Cardiovascular:     Rate and Rhythm: Normal rate. Rhythm irregular.     Pulses: Normal pulses.     Heart sounds: Normal heart sounds.  Pulmonary:     Effort: Pulmonary effort is normal. No respiratory distress.     Breath sounds: Rhonchi present.  Abdominal:     General: Abdomen is flat. Bowel sounds are normal.      Palpations: Abdomen is soft.  Musculoskeletal:        General: No swelling or tenderness. Normal range of motion.     Cervical back: Normal range of motion and neck supple.  Skin:    General: Skin is warm.     Capillary Refill: Capillary refill takes 2 to 3 seconds.     Coloration: Skin is not jaundiced.     Findings: No erythema.  Neurological:     General: No focal deficit present.     Mental Status: She is oriented to person, place, and time. Mental status is at baseline.  Psychiatric:        Mood and Affect: Mood normal.        Behavior: Behavior normal.        Thought Content: Thought content normal.      Data Reviewed:  Latest Reference Range & Units 08/21/23 06:27  Sodium 135 - 145 mmol/L 139  Potassium 3.5 - 5.1 mmol/L 4.0  Chloride 98 - 111 mmol/L 97 (L)  CO2 22 - 32 mmol/L 33 (H)  Glucose 70 - 99 mg/dL 469 (H)  BUN 8 - 23 mg/dL 10  Creatinine 6.29 - 5.28 mg/dL 4.13  Calcium 8.9 - 24.4 mg/dL 8.8 (L)  Anion gap 5 - 15  9  GFR, Estimated >60 mL/min >60  WBC 4.0 - 10.5 K/uL 9.5  RBC 3.87 - 5.11 MIL/uL 2.77 (L)  Hemoglobin 12.0 - 15.0 g/dL 7.7 (L)  HCT 01.0 - 27.2 % 25.2 (L)  MCV 80.0 - 100.0 fL 91.0  MCH 26.0 - 34.0 pg 27.8  MCHC 30.0 - 36.0 g/dL 53.6  RDW 64.4 - 03.4 % 16.1 (H)  Platelets 150 - 400 K/uL 528 (H)  nRBC 0.0 - 0.2 % 0.0  (L): Data is abnormally low (H): Data is abnormally high  Family Communication: Updated family at bedside  Disposition: Status is: Inpatient--> change status from stepdown to acute inpatient Remains inpatient appropriate because: Oxygen requirements, tachycardic still  Planned Discharge Destination: Skilled nursing facility    Time spent: 35 minutes  Author: Ernestene Mention, MD 08/21/2023 2:10 PM  For on call review [email or web address].

## 2023-08-21 NOTE — TOC Progression Note (Signed)
Transition of Care [facility]) - Progression Note    Patient Details  Name: Jordan Chan MRN: [id number] Date of Birth: 1951/03/16  Transition of Care [facility]) CM/SW Contact  Chapman Fitch, RN Phone Number: 08/21/2023, 3:20 PM  Clinical Narrative:     Insurance auth obtained Per MD anticipated patient will medically be ready for discharge tomorrow Tammy at confirms they can admit tomorrow   Expected Discharge Plan: Skilled Nursing Facility Barriers to Discharge: Continued Medical Work up  Expected Discharge Plan and Services     Post Acute Care Choice:  (TBD) Living arrangements for the past 2 months: Single Family Home                                       Social Determinants of Health (SDOH) Interventions SDOH Screenings   Food Insecurity: No Food Insecurity (04/16/2023)  Housing: Unknown (08/20/2023)  Transportation Needs: No Transportation Needs (08/20/2023)  Utilities: Patient Unable To Answer (08/09/2023)  Alcohol Screen: Low Risk  (08/20/2023)  Depression (PHQ2-9): Low Risk  (08/04/2023)  Recent Concern: Depression (PHQ2-9) - Medium Risk (07/27/2023)  Financial Resource Strain: Low Risk  (08/20/2023)  Physical Activity: Inactive (12/23/2022)  Social Connections: Patient Unable To Answer (08/09/2023)  Stress: No Stress Concern Present (12/23/2022)  Tobacco Use: Medium Risk (08/20/2023)    Readmission Risk Interventions    08/18/2023    2:50 PM  Readmission Risk Prevention Plan  Transportation Screening Complete  Medication Review (RN Care Manager) Complete  PCP or Specialist appointment within 3-5 days of discharge Complete  SW Recovery Care/Counseling Consult Complete  Palliative Care Screening Not Applicable  Skilled Nursing Facility Complete

## 2023-08-22 ENCOUNTER — Other Ambulatory Visit: Payer: Self-pay | Admitting: Nurse Practitioner

## 2023-08-22 DIAGNOSIS — J9622 Acute and chronic respiratory failure with hypercapnia: Secondary | ICD-10-CM | POA: Diagnosis not present

## 2023-08-22 DIAGNOSIS — J9621 Acute and chronic respiratory failure with hypoxia: Secondary | ICD-10-CM | POA: Diagnosis not present

## 2023-08-22 LAB — CBC
HCT: 24.4 % — ABNORMAL LOW (ref 36.0–46.0)
Hemoglobin: 7.7 g/dL — ABNORMAL LOW (ref 12.0–15.0)
MCH: 27.8 pg (ref 26.0–34.0)
MCHC: 31.6 g/dL (ref 30.0–36.0)
MCV: 88.1 fL (ref 80.0–100.0)
Platelets: 514 10*3/uL — ABNORMAL HIGH (ref 150–400)
RBC: 2.77 MIL/uL — ABNORMAL LOW (ref 3.87–5.11)
RDW: 15.9 % — ABNORMAL HIGH (ref 11.5–15.5)
WBC: 10.7 10*3/uL — ABNORMAL HIGH (ref 4.0–10.5)
nRBC: 0 % (ref 0.0–0.2)

## 2023-08-22 LAB — IRON AND TIBC
Iron: 22 ug/dL — ABNORMAL LOW (ref 28–170)
Saturation Ratios: 6 % — ABNORMAL LOW (ref 10.4–31.8)
TIBC: 354 ug/dL (ref 250–450)
UIBC: 332 ug/dL

## 2023-08-22 LAB — GLUCOSE, CAPILLARY
Glucose-Capillary: 131 mg/dL — ABNORMAL HIGH (ref 70–99)
Glucose-Capillary: 173 mg/dL — ABNORMAL HIGH (ref 70–99)

## 2023-08-22 LAB — FERRITIN: Ferritin: 28 ng/mL (ref 11–307)

## 2023-08-22 LAB — FOLATE: Folate: 12.4 ng/mL (ref >5.9)

## 2023-08-22 LAB — VITAMIN B12: Vitamin B-12: 725 pg/mL (ref 180–914)

## 2023-08-22 MED ORDER — POLYETHYLENE GLYCOL 3350 17 G PO PACK: 17.0000 g | PACK | Freq: Every day | ORAL | 0 refills | Status: DC | PRN

## 2023-08-22 MED ORDER — ADULT MULTIVITAMIN W/MINERALS CH: 1.0000 | ORAL_TABLET | Freq: Every day | ORAL | 1 refills | Status: DC

## 2023-08-22 MED ORDER — ALBUTEROL SULFATE HFA 108 (90 BASE) MCG/ACT IN AERS: 2.0000 | INHALATION_SPRAY | Freq: Four times a day (QID) | RESPIRATORY_TRACT | 1 refills | Status: DC | PRN

## 2023-08-22 MED ORDER — GABAPENTIN 400 MG PO CAPS: 400.0000 mg | ORAL_CAPSULE | Freq: Three times a day (TID) | ORAL | 1 refills | Status: DC

## 2023-08-22 MED ORDER — MONTELUKAST SODIUM 10 MG PO TABS: 10.0000 mg | ORAL_TABLET | Freq: Every day | ORAL | 0 refills | Status: DC

## 2023-08-22 MED ORDER — ATORVASTATIN CALCIUM 40 MG PO TABS: 40.0000 mg | ORAL_TABLET | Freq: Every day | ORAL | 1 refills | Status: DC

## 2023-08-22 MED ORDER — METFORMIN HCL 1000 MG PO TABS: 1000.0000 mg | ORAL_TABLET | Freq: Two times a day (BID) | ORAL | 1 refills | Status: DC

## 2023-08-22 MED ORDER — PANTOPRAZOLE SODIUM 40 MG PO TBEC: 40.0000 mg | DELAYED_RELEASE_TABLET | Freq: Every day | ORAL | 1 refills | Status: DC

## 2023-08-22 MED ORDER — ALUM & MAG HYDROXIDE-SIMETH 200-200-20 MG/5ML PO SUSP: 30.0000 mL | Freq: Four times a day (QID) | ORAL | 1 refills | Status: DC | PRN

## 2023-08-22 MED ORDER — VENLAFAXINE HCL ER 150 MG PO CP24: 150.0000 mg | ORAL_CAPSULE | Freq: Every day | ORAL | 0 refills | Status: DC

## 2023-08-22 MED ORDER — DOCUSATE SODIUM 100 MG PO CAPS: 100.0000 mg | ORAL_CAPSULE | Freq: Two times a day (BID) | ORAL | 0 refills | Status: AC | PRN

## 2023-08-22 MED ORDER — METOPROLOL TARTRATE 25 MG PO TABS: 12.5000 mg | ORAL_TABLET | Freq: Two times a day (BID) | ORAL | 1 refills | Status: DC

## 2023-08-22 MED ORDER — LOSARTAN POTASSIUM 25 MG PO TABS: 12.5000 mg | ORAL_TABLET | Freq: Every day | ORAL | 1 refills | Status: DC

## 2023-08-22 MED ORDER — FUROSEMIDE 40 MG PO TABS: 20.0000 mg | ORAL_TABLET | Freq: Every day | ORAL | 1 refills | Status: AC

## 2023-08-22 MED ORDER — APIXABAN 5 MG PO TABS: 5.0000 mg | ORAL_TABLET | Freq: Two times a day (BID) | ORAL | 0 refills | Status: DC

## 2023-08-22 MED ORDER — ALBUTEROL SULFATE (2.5 MG/3ML) 0.083% IN NEBU: 2.5000 mg | INHALATION_SOLUTION | RESPIRATORY_TRACT | 1 refills | Status: AC | PRN

## 2023-08-22 MED ORDER — BUSPIRONE HCL 5 MG PO TABS: 5.0000 mg | ORAL_TABLET | Freq: Two times a day (BID) | ORAL | 1 refills | Status: AC

## 2023-08-22 MED ORDER — POLYVINYL ALCOHOL 1.4 % OP SOLN: 2.0000 [drp] | OPHTHALMIC | 1 refills | Status: DC | PRN

## 2023-08-22 MED ORDER — ENSURE ENLIVE PO LIQD: 237.0000 mL | Freq: Three times a day (TID) | ORAL | 12 refills | Status: DC

## 2023-08-22 MED ORDER — FERROUS SULFATE 325 (65 FE) MG PO TBEC: 325.0000 mg | DELAYED_RELEASE_TABLET | Freq: Two times a day (BID) | ORAL | 1 refills | Status: DC

## 2023-08-22 NOTE — Progress Notes (Signed)
SaO2 on room air at rest = 83% SaO2 on room air while ambulating =unsafe to assess due to desaturation on rm air at rest SaO2 on 2 liters of O2 while ambulating = 94%

## 2023-08-22 NOTE — TOC Transition Note (Signed)
Transition of Care [facility]) - Discharge Note   Patient Details  Name: Jordan Chan MRN: [id number] Date of Birth: 03/15/1951  Transition of Care [facility]) CM/SW Contact:  Liliana Cline, LCSW Phone Number: 08/22/2023, 10:05 AM   Clinical Narrative:    Patient to DC home with Home Health today. CSW notified Laurelyn Sickle with [facility]. Per PT this morning, patient needs continuous o2 and a wheelchair. DME order made to [facility] with [facility] for bedside delivery prior to DC today.   Final next level of care: Home w Home Health Services Barriers to Discharge: Barriers Resolved   Patient Goals and CMS Choice            Discharge Placement                       Discharge Plan and Services Additional resources added to the After Visit Summary for       Post Acute Care Choice:  (TBD)          DME Arranged: Oxygen, Wheelchair manual DME Agency: [facility] Date DME Agency Contacted: 08/22/23   Representative spoke with at DME Agency: Selena Batten   [facility] Agency: [facility] Date [facility] Agency Contacted: 08/22/23   Representative spoke with at [facility] Agency: Laurelyn Sickle  Social Drivers of Health (SDOH) Interventions SDOH Screenings   Food Insecurity: No Food Insecurity (04/16/2023)  Housing: Unknown (08/20/2023)  Transportation Needs: No Transportation Needs (08/20/2023)  Utilities: Patient Unable To Answer (08/09/2023)  Alcohol Screen: Low Risk  (08/20/2023)  Depression (PHQ2-9): Low Risk  (08/04/2023)  Recent Concern: Depression (PHQ2-9) - Medium Risk (07/27/2023)  Financial Resource Strain: Low Risk  (08/20/2023)  Physical Activity: Inactive (12/23/2022)  Social Connections: Patient Unable To Answer (08/09/2023)  Stress: No Stress Concern Present (12/23/2022)  Tobacco Use: Medium Risk (08/20/2023)     Readmission Risk Interventions    08/18/2023    2:50 PM  Readmission Risk Prevention Plan  Transportation Screening Complete  Medication Review (RN Care Manager) Complete  PCP or  Specialist appointment within 3-5 days of discharge Complete  SW Recovery Care/Counseling Consult Complete  Palliative Care Screening Not Applicable  Skilled Nursing Facility Complete

## 2023-08-22 NOTE — Progress Notes (Signed)
Physical Therapy Treatment Patient Details Name: Jordan Chan MRN: [id number] DOB: 09-07-1950 Today's Date: 08/22/2023   History of Present Illness Pt is a 73 y/o F admitted on 08/09/23 after presenting with c/o respiratory distress, O2 sats in the 50s-60s on room air. Pt admitted to ICU for tx of acute hypoxic hypercapnic respiratory failure 2/2 suspected PNA & AECOPD, pt intubated but successfully extubated on 08/15/23. Pt also found to have acute PE during admission. PMH: B12 deficiency, chronic pain syndrome s/p intrathecal pump, paroxysmal a-fib, stress-induced cardiomyopathy, PE, DM, COPD on nocturnal O2, GERD, OSA, IBS, CVA    PT Comments  Pt was long sitting in bed with significant other at bedside. On 2 L o2 with sao2 96% however she desaturates to 83% when placed on rm air. Reapplied 2 L o2 throughout remainder of session. Pt demonstrated abilities to exit bed, stand, and ambulate short distances. No LOB but pt severely limited by fatigue. Pt and caregiver state confidence in safe DC home later this date. Will notify TOC about request for [facility] and need for home o2 24/7. Pt does not want STR at DC. Recommend HHPT.    If plan is discharge home, recommend the following: A little help with walking and/or transfers;A little help with bathing/dressing/bathroom;Assist for transportation;Help with stairs or ramp for entrance;Assistance with Systems developer (measurements PT);Wheelchair cushion (measurements PT) (home O2)       Precautions / Restrictions Precautions Precautions: Fall Restrictions Weight Bearing Restrictions Per Provider Order: No     Mobility  Bed Mobility Overal bed mobility: Needs Assistance Bed Mobility: Supine to Sit, Sit to Supine Sidelying to sit: Supervision, HOB elevated, Used rails Supine to sit: Supervision Sit to supine: Supervision        Transfers Overall transfer level: Needs assistance Equipment used:  Rolling walker (2 wheels) Transfers: Sit to/from Stand Sit to Stand: Supervision  General transfer comment: pt stood from lowest bed height 2 x & transport chair 2 x.    Ambulation/Gait Ambulation/Gait assistance: Contact guard assist Gait Distance (Feet): 30 Feet Assistive device: Rolling walker (2 wheels) Gait Pattern/deviations: Step-through pattern, Decreased step length - right, Decreased step length - left, Trunk flexed Gait velocity: decreased  General Gait Details: pt ambulated ~ 30 ft 2 x    Balance Overall balance assessment: Needs assistance Sitting-balance support: Feet supported Sitting balance-Leahy Scale: Good     Standing balance support: During functional activity, Bilateral upper extremity supported Standing balance-Leahy Scale: Good       Cognition Arousal: Alert Behavior During Therapy: WFL for tasks assessed/performed Overall Cognitive Status: Within Functional Limits for tasks assessed      General Comments: Pt is A and O x 4. significant other at bedside           General Comments General comments (skin integrity, edema, etc.): pt was on 2 L o2 upon arrival sao2 96%. placed pt on rm air and she desaturates to 83%. reapplied 2 L and pt quickly recovers to > 88%      Pertinent Vitals/Pain Pain Assessment Pain Assessment: No/denies pain Pain Score: 0-No pain     PT Goals (current goals can now be found in the care plan section) Acute Rehab PT Goals Patient Stated Goal: get better Progress towards PT goals: Progressing toward goals    Frequency    Min 1X/week       Co-evaluation     PT goals addressed during session: Mobility/safety with mobility;Balance;Proper use  of DME;Strengthening/ROM        AM-PAC PT "6 Clicks" Mobility   Outcome Measure  Help needed turning from your back to your side while in a flat bed without using bedrails?: A Little Help needed moving from lying on your back to sitting on the side of a flat bed  without using bedrails?: A Little Help needed moving to and from a bed to a chair (including a wheelchair)?: A Little Help needed standing up from a chair using your arms (e.g., wheelchair or bedside chair)?: A Little Help needed to walk in hospital room?: A Little Help needed climbing 3-5 steps with a railing? : A Little 6 Click Score: 18    End of Session Equipment Utilized During Treatment: Oxygen Activity Tolerance: Patient tolerated treatment well Patient left: in bed;with call bell/phone within reach;with bed alarm set;with family/visitor present Nurse Communication: Mobility status PT Visit Diagnosis: Muscle weakness (generalized) (M62.81);Difficulty in walking, not elsewhere classified (R26.2);Other abnormalities of gait and mobility (R26.89);Unsteadiness on feet (R26.81)     Time: 1308-6578 PT Time Calculation (min) (ACUTE ONLY): 24 min  Charges:    $Gait Training: 8-22 mins $Therapeutic Activity: 8-22 mins PT General Charges $$ ACUTE PT VISIT: 1 Visit                     Jetta Lout PTA 08/22/23, 9:01 AM

## 2023-08-22 NOTE — Progress Notes (Signed)
    Durable Medical Equipment  (From admission, onward)           Start     Ordered   08/22/23 1023  DME Oxygen  Once       Comments: Deliver Oxygen to patient's room. SaO2 on room air at rest = 83% SaO2 on room air while ambulating =unsafe to assess due to desaturation on rm air at rest SaO2 on 2 liters of O2 while ambulating = 94%  Question Answer Comment  Length of Need 6 Months   Mode or (Route) Nasal cannula   Liters per Minute 2   Frequency Continuous (stationary and portable oxygen unit needed)   Oxygen delivery system Gas      08/22/23 1024   08/22/23 1023  DME standard manual wheelchair with seat cushion  Once       Comments: Patient suffers from physical debility and dyspnea which impairs their ability to perform daily activities like bathing, dressing, and toileting in the home.  A cane, crutch, or walker will not resolve issue with performing activities of daily living. A wheelchair will allow patient to safely perform daily activities. Patient can safely propel the wheelchair in the home or has a caregiver who can provide assistance. Length of need 6 months . Accessories: elevating leg rests (ELRs), wheel locks, extensions and anti-tippers.  Please deliver to patient's room before discharge   08/22/23 1024   08/21/23 1634  DME Oxygen  Once       Question Answer Comment  Length of Need 6 Months   Mode or (Route) Nasal cannula   Liters per Minute 2   Frequency Continuous (stationary and portable oxygen unit needed)   Oxygen conserving device No   Oxygen delivery system Gas      08/21/23 1633

## 2023-08-23 DIAGNOSIS — I130 Hypertensive heart and chronic kidney disease with heart failure and stage 1 through stage 4 chronic kidney disease, or unspecified chronic kidney disease: Secondary | ICD-10-CM | POA: Diagnosis not present

## 2023-08-23 DIAGNOSIS — J441 Chronic obstructive pulmonary disease with (acute) exacerbation: Secondary | ICD-10-CM | POA: Diagnosis not present

## 2023-08-23 DIAGNOSIS — I4819 Other persistent atrial fibrillation: Secondary | ICD-10-CM | POA: Diagnosis not present

## 2023-08-23 DIAGNOSIS — J188 Other pneumonia, unspecified organism: Secondary | ICD-10-CM | POA: Diagnosis not present

## 2023-08-23 DIAGNOSIS — J440 Chronic obstructive pulmonary disease with acute lower respiratory infection: Secondary | ICD-10-CM | POA: Diagnosis not present

## 2023-08-23 DIAGNOSIS — A419 Sepsis, unspecified organism: Secondary | ICD-10-CM | POA: Diagnosis not present

## 2023-08-23 DIAGNOSIS — J9622 Acute and chronic respiratory failure with hypercapnia: Secondary | ICD-10-CM | POA: Diagnosis not present

## 2023-08-23 DIAGNOSIS — I5043 Acute on chronic combined systolic (congestive) and diastolic (congestive) heart failure: Secondary | ICD-10-CM | POA: Diagnosis not present

## 2023-08-23 DIAGNOSIS — J9621 Acute and chronic respiratory failure with hypoxia: Secondary | ICD-10-CM | POA: Diagnosis not present

## 2023-08-24 ENCOUNTER — Telehealth: Payer: Self-pay

## 2023-08-24 ENCOUNTER — Telehealth: Payer: Self-pay | Admitting: Nurse Practitioner

## 2023-08-24 NOTE — Telephone Encounter (Signed)
Called and gave verbal orders per Jolene.  

## 2023-08-24 NOTE — Transitions of Care (Post Inpatient/ED Visit) (Signed)
08/24/2023  Name: Jordan Chan MRN: [id number] DOB: 1951/06/19  Today's TOC FU Call Status: Today's TOC FU Call Status:: Successful TOC FU Call Completed TOC FU Call Complete Date: 08/24/23 Patient's Name and Date of Birth confirmed.  Transition Care Management Follow-up Telephone Call Date of Discharge: 08/22/23 Discharge Facility: [facility]) Type of Discharge: Inpatient Admission Primary Inpatient Discharge Diagnosis:: Acute on chronic respiratory failure with hypoxia and hypercapnia How have you been since you were released from the hospital?: Better Any questions or concerns?: No  Items Reviewed: Did you receive and understand the discharge instructions provided?: Yes Medications obtained,verified, and reconciled?: Yes (Medications Reviewed) (Medication reconciliation completed based on recent discharge summary Patient taking medications as instructed and is aware of any changes or dosage adjustments medication regimen. Patient denies questions and reports no barriers to medication adherence) Any new allergies since your discharge?: No Dietary orders reviewed?: Yes Type of Diet Ordered:: Reg [facility] NAS Carb Modified , Uses Ensure , Do you have support at home?: Yes People in Home: spouse, child(ren), dependent, child(ren), adult Name of Support/Comfort Primary Source: Husband Harvie Heck, Jolaine Artist ( Disabeled) Daughter Elease Hashimoto  and SIL Harvie Heck  Medications Reviewed Today: Medications Reviewed Today     Reviewed by Johnnette Barrios, RN (Registered Nurse) on 08/24/23 at 1000  Med List Status: <None>   Medication Order Taking? Sig Documenting Provider Last Dose Status Informant  ACCU-CHEK AVIVA PLUS test strip [id number] No TEST THREE TIMES DAILY  Patient not taking: Reported on 08/24/2023   Aura Dials T, NP Not Taking Active Self           Med Note Sharon Seller, Phynix Horton L   Mon Aug 24, 2023  9:57 AM) Uses Libre device   Accu-Chek Softclix  Lancets lancets [id number] No TEST BLOOD SUGAR THREE TIMES DAILY  Patient not taking: Reported on 08/24/2023   Aura Dials T, NP Not Taking Active Self  albuterol (PROVENTIL) (2.5 MG/3ML) 0.083% nebulizer solution [id number] No Take 3 mLs (2.5 mg total) by nebulization every 4 (four) hours as needed for wheezing or shortness of breath.  Patient not taking: Reported on 08/24/2023   Ernestene Mention, MD Not Taking Active            Med Note Sharon Seller, Heath Badon L   Mon Aug 24, 2023  9:58 AM) Linton Ham recently can't fins neb machine   albuterol (VENTOLIN HFA) 108 (90 Base) MCG/ACT inhaler [id number] Yes Inhale 2 puffs into the lungs every 6 (six) hours as needed for wheezing or shortness of breath. Ernestene Mention, MD Taking Active   Alcohol Swabs (DROPSAFE ALCOHOL PREP) 70 % PADS [id number] No USE TWICE DAILY  WITH  SUGAR  CHECKS  Patient not taking: Reported on 08/24/2023   Aura Dials T, NP Not Taking Active Self  alum & mag hydroxide-simeth (MAALOX/MYLANTA) 200-200-20 MG/5ML suspension [id number] Yes Take 30 mLs by mouth every 6 (six) hours as needed for indigestion or heartburn. Ernestene Mention, MD Taking Active   apixaban (ELIQUIS) 5 MG TABS tablet [id number] Yes Take 1 tablet (5 mg total) by mouth 2 (two) times daily. Ernestene Mention, MD Taking Active   atorvastatin (LIPITOR) 40 MG tablet [id number] Yes Take 1 tablet (40 mg total) by mouth daily. Ernestene Mention, MD Taking Active   Blood Glucose Monitoring Suppl (TRUE METRIX METER) w/Device KIT [id number] No Use to check blood sugar 4 times a day  Patient not taking: Reported on 08/24/2023   Aura Dials  T, NP Not Taking Active Self  busPIRone (BUSPAR) 5 MG tablet [id number] Yes Take 1 tablet (5 mg total) by mouth 2 (two) times daily. Ernestene Mention, MD Taking Active   cholecalciferol (VITAMIN D3) 25 MCG (1000 UNIT) tablet [id number] Yes Take 1,000 Units by mouth daily. [provider] Taking Active Self  Continuous Glucose Receiver  (FREESTYLE LIBRE 2 READER) DEVI [id number] Yes To check blood sugars 3-4 times daily due to insulin use. DX E11.40 Marjie Skiff, NP Taking Active Self  Continuous Glucose Sensor (FREESTYLE LIBRE 2 SENSOR) MISC [id number] Yes To check blood sugars 3-4 times daily due to insulin use. DX E11.40 Aura Dials T, NP Taking Active Self  Cyanocobalamin 1000 MCG/ML KIT [id number] Yes Inject 1,000 mcg as directed every 30 (thirty) days. [provider] Taking Active Self  docusate sodium (COLACE) 100 MG capsule [id number] Yes Take 1 capsule (100 mg total) by mouth 2 (two) times daily as needed for mild constipation. Ernestene Mention, MD Taking Active   feeding supplement (ENSURE ENLIVE / ENSURE PLUS) LIQD [id number] Yes Take 237 mLs by mouth 3 (three) times daily between meals. Ernestene Mention, MD Taking Active   ferrous sulfate 325 (65 FE) MG EC tablet [id number] Yes Take 1 tablet (325 mg total) by mouth 2 (two) times daily. Ernestene Mention, MD Taking Active   Fluticasone-Umeclidin-Vilant (TRELEGY ELLIPTA) 100-62.5-25 MCG/ACT AEPB [id number] Yes INHALE 1 PUFF INTO THE LUNGS DAILY -42 DAY EXPIRATION AFTER OPENING FOIL TRAY [name], Jolene T, NP Taking Active Self  folic acid (FOLVITE) 1 MG tablet [id number] Yes TAKE 1 TABLET EVERY DAY Alinda Dooms, NP Taking Active Self  furosemide (LASIX) 40 MG tablet [id number] Yes Take 0.5 tablets (20 mg total) by mouth daily. Ernestene Mention, MD Taking Active   gabapentin (NEURONTIN) 400 MG capsule [id number] Yes Take 1 capsule (400 mg total) by mouth 3 (three) times daily. Ernestene Mention, MD Taking Active   insulin lispro (HUMALOG KWIKPEN) 100 UNIT/ML KwikPen [id number] Yes Inject 5 Units into the skin 3 (three) times daily before meals. Do not inject insulin if you are not going to eat meal.  Only take before a meal. [name], Dorie Rank, NP Taking Active Self           Med Note (Enda Santo L   Mon Aug 24, 2023  9:59 AM) Verified 5 units with her   losartan  (COZAAR) 25 MG tablet [id number] Yes Take 0.5 tablets (12.5 mg total) by mouth daily. Ernestene Mention, MD Taking Active   metFORMIN (GLUCOPHAGE) 1000 MG tablet [id number] Yes Take 1 tablet (1,000 mg total) by mouth 2 (two) times daily with a meal. Kadali, Renuka A, MD Taking Active   metoprolol tartrate (LOPRESSOR) 25 MG tablet [id number] Yes Take 0.5 tablets (12.5 mg total) by mouth 2 (two) times daily. Ernestene Mention, MD Taking Active   montelukast (SINGULAIR) 10 MG tablet [id number] Yes Take 1 tablet (10 mg total) by mouth at bedtime. Ernestene Mention, MD Taking Active   Multiple Vitamin (MULTIVITAMIN WITH MINERALS) TABS tablet [id number] Yes Take 1 tablet by mouth daily. Ernestene Mention, MD Taking Active   naloxone [facility]) nasal spray 4 mg/0.1 mL [id number] Yes Place 1 spray into the nose as needed for up to 365 doses (for opioid-induced respiratory depresssion). In case of emergency (overdose), spray once into each nostril. If no response within 3 minutes, repeat application and call 911. Delano Metz, MD Taking Active   Nebulizers (EASY  AIR COMPRESSOR NEBULIZER) MISC [id number] No Use to inhaler nebulizer treatments at needed per instructions on nebulizer prescription  Patient not taking: Reported on 08/24/2023   Aura Dials T, NP Not Taking Active Self  ondansetron (ZOFRAN) 4 MG tablet [id number] Yes Take 1 tablet (4 mg total) by mouth daily as needed for nausea or vomiting. Pilar Jarvis, MD Taking Active Self  OXYGEN [id number] Yes Inhale 2 L into the lungs at bedtime. [provider] Taking Active   PAIN MANAGEMENT INTRATHECAL, IT, PUMP [id number] Yes 1 each by Intrathecal route. Intrathecal (IT) medication:  Morphine 5.0 mg/ml, Bupivacaine 20.0 mg/ml,  Clonidine 100.0 mcg/ml Patient does not remember current. Adjusted every 3 months at [facility] [provider] Taking Active Self           Med Note Anabel Halon Apr 16, 2023  6:23 AM) Morphine,  Bupivacaine and clonidine   pantoprazole (PROTONIX) 40 MG tablet [id number] Yes Take 1 tablet (40 mg total) by mouth daily. Ernestene Mention, MD Taking Active   polyethylene glycol (MIRALAX / GLYCOLAX) 17 g packet [id number] Yes Take 17 g by mouth daily as needed for moderate constipation. Ernestene Mention, MD Taking Active   polyvinyl alcohol (LIQUIFILM TEARS) 1.4 % ophthalmic solution [id number] Yes Place 2 drops into both eyes as needed for dry eyes. Ernestene Mention, MD Taking Active   TRESIBA FLEXTOUCH 100 UNIT/ML FlexTouch Pen [id number] Yes INJECT 55 UNITS SUBCUTANEOUSLY AT BEDTIME [name], Jolene T, NP Taking Active   TRULICITY 4.5 MG/0.5ML SOPN [id number] Yes INJECT 4.5MG  INTO THE SKIN ONCE WEEKLY AS DIRECTED [name], Jolene T, NP Taking Active   venlafaxine XR (EFFEXOR-XR) 150 MG 24 hr capsule [id number] Yes Take 1 capsule (150 mg total) by mouth daily. Ernestene Mention, MD Taking Active             Home Care and Equipment/Supplies: Were Home Health Services Ordered?: Yes Name of Home Health Agency:: [facility] [phone] Has Agency set up a time to come to your home?: Yes First Home Health Visit Date: 08/23/23 Any new equipment or medical supplies ordered?: Yes Name of Medical supply agency?: [employer] Were you able to get the equipment/medical supplies?: Yes Do you have any questions related to the use of the equipment/supplies?: No ([facility] Nurse and [employer] Tech reviewed equipmet)  Functional Questionnaire: Do you need assistance with bathing/showering or dressing?: No Do you need assistance with meal preparation?: No Do you need assistance with eating?: No Do you have difficulty maintaining continence: No Do you need assistance with getting out of bed/getting out of a chair/moving?: No Do you have difficulty managing or taking your medications?: Yes (Daughter and SIL assist)  Follow up appointments reviewed: PCP Follow-up appointment confirmed?: Yes Date of  PCP follow-up appointment?: 09/14/23 Follow-up Provider: Aura Dials- previously scheduked she will check if she needs to be seen sooner Specialist Hospital Follow-up appointment confirmed?: Yes Date of Specialist follow-up appointment?: 08/26/23 Follow-Up Specialty Provider:: Cardiology 1/22 GI 1/27 [facility] Nurse 2/12 Do you need transportation to your follow-up appointment?: No (she drives but spouse will transport) Do you understand care options if your condition(s) worsen?: Yes-patient verbalized understanding  SDOH Interventions Today    Flowsheet Row Most Recent Value  SDOH Interventions   Food Insecurity Interventions Intervention Not Indicated  Housing Interventions Intervention Not Indicated  Transportation Interventions Intervention Not Indicated, Patient Resources (Friends/Family)  Utilities Interventions Intervention Not Indicated  Social Connections Interventions Intervention Not Indicated  Interventions Today    Flowsheet Row Most Recent Value  Chronic Disease   Chronic disease during today's visit Diabetes, Other  General Interventions   General Interventions Discussed/Reviewed General Interventions Discussed, General Interventions Reviewed, Doctor Visits  Doctor Visits Discussed/Reviewed PCP, Specialist, Doctor Visits Reviewed  PCP/Specialist Visits Compliance with follow-up visit  Exercise Interventions   Exercise Discussed/Reviewed Physical Activity  Physical Activity Discussed/Reviewed Physical Activity Reviewed  Mental Health Interventions   Mental Health Discussed/Reviewed Coping Strategies  Nutrition Interventions   Nutrition Discussed/Reviewed Nutrition Reviewed, Supplemental nutrition  Pharmacy Interventions   Pharmacy Dicussed/Reviewed Medications and their functions  Safety Interventions   Safety Discussed/Reviewed Home Safety, Safety Reviewed  Home Safety Assistive Devices         Patient is at high risk for readmission and/or has history of   high utilization  Discussed VBCI  TOC program and weekly calls to patient to assess condition/status, medication management  and provide support/education as indicated . Patient/ Caregiver voiced understanding and declined enrollment in the 30-day TOC Program.  She is doing well at home She has strong famiy support. Followed by [facility] for Nsg, and PT/T. She had 1st visit 1.19. She is aldo followed by [facility] CM and has previously scheduled appointments Next 09/16/23.. She uses [employer] for 02 services and is getting a refillable tank this week. She is able to drive . Her follow-up appointments were reviewed PCP 2/10 - she will cll to see if this needs to be changes.  Medication reconciliation / review completed based on most recent discharge summary and EHR medication list. Confirmed patient is taking all newly prescribed medications as instructed (any discrepancies are noted in review section)   Patient / Caregiver is aware of any changes to and / or  any dosage adjustments to medication regimen. Reviewed all changes, clarified doses , several she did not receive a prescription for ( i.e. Gabapentin dose decrease) She has pain pump with dose adjustments q 3 months  Patient/ Caregiver denies questions at this time and reports no barriers to medication adherence.  She is followed by [facility] Nursing and [facility] will defer to these Nurses to avoid duplication in CM services   The patient has been provided with contact information for the care management team and has been advised to call with any health related questions or concerns.   Susa Loffler , BSN, RN [facility]   [facility] RN Care Manager Direct Dial [phone] Fax [phone] Website: [email or web address]

## 2023-08-24 NOTE — Telephone Encounter (Signed)
Requested medication (s) are due for refill today: no  Requested medication (s) are on the active medication list: yes  Last refill:  08/21/23 #30 1 RF  Future visit scheduled: yes  Notes to clinic:  end date 08/22/23 Prescribed at hospital d/c   Requested Prescriptions  Pending Prescriptions Disp Refills   pantoprazole (PROTONIX) 40 MG tablet [Pharmacy Med Name: Pantoprazole Sodium 40 MG Oral Tablet Delayed Release] 90 tablet 0    Sig: Take 1 tablet by mouth once daily     Gastroenterology: Proton Pump Inhibitors Passed - 08/24/2023  9:54 AM      Passed - Valid encounter within last 12 months    Recent Outpatient Visits           3 weeks ago Chronic HFrEF (heart failure with reduced ejection fraction) (HCC)   [place] [facility] [place], [place] T, NP   4 weeks ago SOB (shortness of breath) on exertion   [place] [facility] [place], [place] T, NP   1 month ago SOB (shortness of breath)   [place] [facility], Megan P, DO   2 months ago Insulin dependent type 2 diabetes mellitus (HCC)   [place] [facility] [place], [place] T, NP   5 months ago Insulin dependent type 2 diabetes mellitus (HCC)    [facility] [place], Dorie Rank, NP       Future Appointments             In 3 weeks [name], Dorie Rank, NP  [facility]

## 2023-08-24 NOTE — Telephone Encounter (Signed)
Copied from CRM [id number]. Topic: Quick Communication - Home Health Verbal Orders >> Aug 24, 2023 10:19 AM Everette C wrote: Caller/Agency: Staci Righter Number: [phone] Service Requested: Skilled Nursing Frequency: Cardio Pulmonary Assessments and Medication Education 2w2 1w3 1w every other 4 Service Requested: PT Frequency: Evaluation  Service Requested: OT Frequency: Evaluation  Any new concerns about the patient? No

## 2023-08-25 ENCOUNTER — Telehealth: Payer: Self-pay | Admitting: Nurse Practitioner

## 2023-08-25 DIAGNOSIS — J440 Chronic obstructive pulmonary disease with acute lower respiratory infection: Secondary | ICD-10-CM | POA: Diagnosis not present

## 2023-08-25 DIAGNOSIS — J9622 Acute and chronic respiratory failure with hypercapnia: Secondary | ICD-10-CM | POA: Diagnosis not present

## 2023-08-25 DIAGNOSIS — J9621 Acute and chronic respiratory failure with hypoxia: Secondary | ICD-10-CM | POA: Diagnosis not present

## 2023-08-25 DIAGNOSIS — I130 Hypertensive heart and chronic kidney disease with heart failure and stage 1 through stage 4 chronic kidney disease, or unspecified chronic kidney disease: Secondary | ICD-10-CM | POA: Diagnosis not present

## 2023-08-25 DIAGNOSIS — J441 Chronic obstructive pulmonary disease with (acute) exacerbation: Secondary | ICD-10-CM | POA: Diagnosis not present

## 2023-08-25 DIAGNOSIS — A419 Sepsis, unspecified organism: Secondary | ICD-10-CM | POA: Diagnosis not present

## 2023-08-25 DIAGNOSIS — I4819 Other persistent atrial fibrillation: Secondary | ICD-10-CM | POA: Diagnosis not present

## 2023-08-25 DIAGNOSIS — I5043 Acute on chronic combined systolic (congestive) and diastolic (congestive) heart failure: Secondary | ICD-10-CM | POA: Diagnosis not present

## 2023-08-25 DIAGNOSIS — J188 Other pneumonia, unspecified organism: Secondary | ICD-10-CM | POA: Diagnosis not present

## 2023-08-25 NOTE — Telephone Encounter (Signed)
Home Health Verbal Orders - Caller/Agency: PHU with [facility]  Callback Number: [phone]  Service Requested: Physical Therapy  Frequency: 1 w 9   Any new concerns about the patient? No

## 2023-08-25 NOTE — Progress Notes (Deleted)
Advanced Heart Failure Clinic Note   Referring Physician: recent admit PCP: Marjie Skiff, NP Cardiologist: Yvonne Kendall, MD   Chief Complaint:   HPI:  Ms Dralle is a 73 y/o female with a history of B12 deficiency, chronic pain syndrome s/p intrathecal pump, paroxysmal atrial fibrillation, stress-induced cardiomyopathy in the setting of pain pump malfunction and opioid withdrawal, PE, DM, COPD, GERD, OSA, breast cancer and IBS.   Admitted 04/15/23 due to worsening of left hip pain at the surgical site associated with increasing redness about the left hip surgical wound. Tachycardic. WBC 17.6. CXR normal.Thought to be septic due to left hip surgical wound infection. Treated with IV fluids, analgesics and empiric IV antibiotics. Transferred to [facility].    Admitted 08/09/23 due to acute respiratory distress found with sats in the 50 to 60% on RA. Found to have multiple PE's and sepsis due to aspiration pneumonia. Initially intubated. Completed antibiotics & successfully extubated. Underwent swallowing evaluation. Heparin stopped and transitioned to eliquis. Anemia stable.   LHC 04/28/17:  No angiographically significant coronary artery disease. Severely reduced LV contraction (LVEF 25-30%) with mid and apical akinesis, consistent with Takotsubo (stress-induced) cardiomyopathy. Mildly elevated left ventricular filling pressure.  Echo 12/11/20: EF 45% with Grade II DD Echo 09/11/21: EF 25-30% with Grade II DD Echo 10/31/21: EF 40-45% with Grade I DD Echo 08/11/23: EF 25-30% with Grade I DD  She presents today for her initial HF clinic visit with a chief complaint of    Review of Systems: [y] = yes, [ ]  = no   General: Weight gain [ ] ; Weight loss [ ] ; Anorexia [ ] ; Fatigue [ ] ; Fever [ ] ; Chills [ ] ; Weakness [ ]   Cardiac: Chest pain/pressure [ ] ; Resting SOB [ ] ; Exertional SOB [ ] ; Orthopnea [ ] ; Pedal Edema [ ] ; Palpitations [ ] ; Syncope [ ] ; Presyncope [ ] ;  Paroxysmal nocturnal dyspnea[ ]   Pulmonary: Cough [ ] ; Wheezing[ ] ; Hemoptysis[ ] ; Sputum [ ] ; Snoring [ ]   GI: Vomiting[ ] ; Dysphagia[ ] ; Melena[ ] ; Hematochezia [ ] ; Heartburn[ ] ; Abdominal pain [ ] ; Constipation [ ] ; Diarrhea [ ] ; BRBPR [ ]   GU: Hematuria[ ] ; Dysuria [ ] ; Nocturia[ ]   Vascular: Pain in legs with walking [ ] ; Pain in feet with lying flat [ ] ; Non-healing sores [ ] ; Stroke [ ] ; TIA [ ] ; Slurred speech [ ] ;  Neuro: Headaches[ ] ; Vertigo[ ] ; Seizures[ ] ; Paresthesias[ ] ;Blurred vision [ ] ; Diplopia [ ] ; Vision changes [ ]   Ortho/Skin: Arthritis [ ] ; Joint pain [ ] ; Muscle pain [ ] ; Joint swelling [ ] ; Back Pain [ ] ; Rash [ ]   Psych: Depression[ ] ; Anxiety[ ]   Heme: Bleeding problems [ ] ; Clotting disorders [ ] ; Anemia [ ]   Endocrine: Diabetes [ ] ; Thyroid dysfunction[ ]    Past Medical History:  Diagnosis Date   Arthritis    Asthma    Atrial fibrillation (HCC)    Breast cancer (HCC) 1998   right breast ca with mastectomy and chemotherapy and radiation   Bronchitis    CHF (congestive heart failure) (HCC)    "with Morphine withdrawal"   COPD (chronic obstructive pulmonary disease) (HCC)    Diabetes mellitus without complication (HCC)    Diverticulitis    diverticulosis also   Dyspnea    Endometriosis    GERD (gastroesophageal reflux disease)    History of shingles 2000-2005   Hypercholesteremia    Hypertension    IBS (irritable bowel syndrome)  Low back pain    a. Implanted morphine/bupivicaine/clonidine pump.   Neuropathy    Orthopnea    Oxygen dependent    uses at night   Personal history of chemotherapy    Personal history of radiation therapy    Pneumonia    pneumonia 5-6 times, history of bronchitis also   Scoliosis    Sleep apnea    does not use cpap   Stroke (HCC) 2010   TIA, 10 years ago   Withdrawal from sedative drug (HCC)    withdrawal from morphine when pump batteries died    Current Outpatient Medications  Medication Sig Dispense Refill    ACCU-CHEK AVIVA PLUS test strip TEST THREE TIMES DAILY (Patient not taking: Reported on 08/24/2023) 300 strip 3   Accu-Chek Softclix Lancets lancets TEST BLOOD SUGAR THREE TIMES DAILY (Patient not taking: Reported on 08/24/2023) 300 each 3   albuterol (PROVENTIL) (2.5 MG/3ML) 0.083% nebulizer solution Take 3 mLs (2.5 mg total) by nebulization every 4 (four) hours as needed for wheezing or shortness of breath. (Patient not taking: Reported on 08/24/2023) 75 mL 1   albuterol (VENTOLIN HFA) 108 (90 Base) MCG/ACT inhaler Inhale 2 puffs into the lungs every 6 (six) hours as needed for wheezing or shortness of breath. 1 each 1   Alcohol Swabs (DROPSAFE ALCOHOL PREP) 70 % PADS USE TWICE DAILY  WITH  SUGAR  CHECKS (Patient not taking: Reported on 08/24/2023) 200 each 2   alum & mag hydroxide-simeth (MAALOX/MYLANTA) 200-200-20 MG/5ML suspension Take 30 mLs by mouth every 6 (six) hours as needed for indigestion or heartburn. 300 mL 1   apixaban (ELIQUIS) 5 MG TABS tablet Take 1 tablet (5 mg total) by mouth 2 (two) times daily. 60 tablet 0   atorvastatin (LIPITOR) 40 MG tablet Take 1 tablet (40 mg total) by mouth daily. 30 tablet 1   Blood Glucose Monitoring Suppl (TRUE METRIX METER) w/Device KIT Use to check blood sugar 4 times a day (Patient not taking: Reported on 08/24/2023) 1 kit 4   busPIRone (BUSPAR) 5 MG tablet Take 1 tablet (5 mg total) by mouth 2 (two) times daily. 30 tablet 1   cholecalciferol (VITAMIN D3) 25 MCG (1000 UNIT) tablet Take 1,000 Units by mouth daily.     Continuous Glucose Receiver (FREESTYLE LIBRE 2 READER) DEVI To check blood sugars 3-4 times daily due to insulin use. DX E11.40 2 each 5   Continuous Glucose Sensor (FREESTYLE LIBRE 2 SENSOR) MISC To check blood sugars 3-4 times daily due to insulin use. DX E11.40 2 each 5   Cyanocobalamin 1000 MCG/ML KIT Inject 1,000 mcg as directed every 30 (thirty) days.     docusate sodium (COLACE) 100 MG capsule Take 1 capsule (100 mg total) by mouth 2  (two) times daily as needed for mild constipation. 30 capsule 0   feeding supplement (ENSURE ENLIVE / ENSURE PLUS) LIQD Take 237 mLs by mouth 3 (three) times daily between meals. 237 mL 12   ferrous sulfate 325 (65 FE) MG EC tablet Take 1 tablet (325 mg total) by mouth 2 (two) times daily. 60 tablet 1   Fluticasone-Umeclidin-Vilant (TRELEGY ELLIPTA) 100-62.5-25 MCG/ACT AEPB INHALE 1 PUFF INTO THE LUNGS DAILY -42 DAY EXPIRATION AFTER OPENING FOIL TRAY 180 each 2   folic acid (FOLVITE) 1 MG tablet TAKE 1 TABLET EVERY DAY 90 tablet 0   furosemide (LASIX) 40 MG tablet Take 0.5 tablets (20 mg total) by mouth daily. 30 tablet 1   gabapentin (NEURONTIN) 400  MG capsule Take 1 capsule (400 mg total) by mouth 3 (three) times daily. 90 capsule 1   insulin lispro (HUMALOG KWIKPEN) 100 UNIT/ML KwikPen Inject 5 Units into the skin 3 (three) times daily before meals. Do not inject insulin if you are not going to eat meal.  Only take before a meal. 15 mL 3   losartan (COZAAR) 25 MG tablet Take 0.5 tablets (12.5 mg total) by mouth daily. 30 tablet 1   metFORMIN (GLUCOPHAGE) 1000 MG tablet Take 1 tablet (1,000 mg total) by mouth 2 (two) times daily with a meal. 60 tablet 1   metoprolol tartrate (LOPRESSOR) 25 MG tablet Take 0.5 tablets (12.5 mg total) by mouth 2 (two) times daily. 30 tablet 1   montelukast (SINGULAIR) 10 MG tablet Take 1 tablet (10 mg total) by mouth at bedtime. 30 tablet 0   Multiple Vitamin (MULTIVITAMIN WITH MINERALS) TABS tablet Take 1 tablet by mouth daily. 30 tablet 1   naloxone (NARCAN) nasal spray 4 mg/0.1 mL Place 1 spray into the nose as needed for up to 365 doses (for opioid-induced respiratory depresssion). In case of emergency (overdose), spray once into each nostril. If no response within 3 minutes, repeat application and call 911. 1 each 1   Nebulizers (EASY AIR COMPRESSOR NEBULIZER) MISC Use to inhaler nebulizer treatments at needed per instructions on nebulizer prescription (Patient not  taking: Reported on 08/24/2023) 1 each 0   ondansetron (ZOFRAN) 4 MG tablet Take 1 tablet (4 mg total) by mouth daily as needed for nausea or vomiting. 30 tablet 1   OXYGEN Inhale 2 L into the lungs at bedtime.     PAIN MANAGEMENT INTRATHECAL, IT, PUMP 1 each by Intrathecal route. Intrathecal (IT) medication:  Morphine 5.0 mg/ml, Bupivacaine 20.0 mg/ml,  Clonidine 100.0 mcg/ml Patient does not remember current. Adjusted every 3 months at [facility]     pantoprazole (PROTONIX) 40 MG tablet Take 1 tablet (40 mg total) by mouth daily. 30 tablet 1   polyethylene glycol (MIRALAX / GLYCOLAX) 17 g packet Take 17 g by mouth daily as needed for moderate constipation. 10 packet 0   polyvinyl alcohol (LIQUIFILM TEARS) 1.4 % ophthalmic solution Place 2 drops into both eyes as needed for dry eyes. 10 mL 1   TRESIBA FLEXTOUCH 100 UNIT/ML FlexTouch Pen INJECT 55 UNITS SUBCUTANEOUSLY AT BEDTIME 30 mL 0   TRULICITY 4.5 MG/0.5ML SOPN INJECT 4.5MG  INTO THE SKIN ONCE WEEKLY AS DIRECTED 12 mL 0   venlafaxine XR (EFFEXOR-XR) 150 MG 24 hr capsule Take 1 capsule (150 mg total) by mouth daily. 30 capsule 0   No current facility-administered medications for this visit.    Allergies  Allergen Reactions   Other Palpitations    IV steroids   Pain Patch [Menthol] Anaphylaxis   Avelox [Moxifloxacin Hcl In Nacl] Other (See Comments)    Upset stomach   Doxycycline Diarrhea   Erythromycin Nausea Only and Other (See Comments)    Can take a Z-Pak just fine Other reaction(s): Other (See Comments) Can take Z-Pak  Can take a Z-Pak just fine   Fentanyl Nausea Only and Rash   Moxifloxacin Hcl Other (See Comments)    Upset stomach Upset stomach   Oxycontin [Oxycodone] Hives   Ozempic [Semaglutide] Nausea Only      Social History   Socioeconomic History   Marital status: Unknown    Spouse name: Harvie Heck   Number of children: 2   Years of education: Not on file  Highest education level: High school graduate   Occupational History   Occupation: retired  Tobacco Use   Smoking status: Former    Current packs/day: 0.00    Average packs/day: 1 pack/day for 30.0 years (30.0 ttl pk-yrs)    Types: Cigarettes    Start date: 04/30/1974    Quit date: 04/30/2004    Years since quitting: 19.3   Smokeless tobacco: Never  Vaping Use   Vaping status: Never Used  Substance and Sexual Activity   Alcohol use: No   Drug use: No   Sexual activity: Not Currently    Birth control/protection: Post-menopausal  Other Topics Concern   Not on file  Social History Narrative   ** Merged History Encounter **       Social Drivers of Health   Financial Resource Strain: Low Risk  (08/20/2023)   Overall Financial Resource Strain (CARDIA)    Difficulty of Paying Living Expenses: Not hard at all  Food Insecurity: No Food Insecurity (08/24/2023)   Hunger Vital Sign    Worried About Running Out of Food in the Last Year: Never true    Ran Out of Food in the Last Year: Never true  Transportation Needs: No Transportation Needs (08/24/2023)   PRAPARE - Administrator, Civil Service (Medical): No    Lack of Transportation (Non-Medical): No  Physical Activity: Inactive (12/23/2022)   Exercise Vital Sign    Days of Exercise per Week: 0 days    Minutes of Exercise per Session: 0 min  Stress: No Stress Concern Present (12/23/2022)   [employer] of Occupational Health - Occupational Stress Questionnaire    Feeling of Stress : Only a little  Social Connections: Socially Integrated (08/24/2023)   Social Connection and Isolation Panel [NHANES]    Frequency of Communication with Friends and Family: More than three times a week    Frequency of Social Gatherings with Friends and Family: More than three times a week    Attends Religious Services: More than 4 times per year    Active Member of [employer] or Organizations: Yes    Attends Engineer, structural: More than 4 times per year    Marital Status: Married   Catering manager Violence: Not At Risk (08/24/2023)   Humiliation, Afraid, Rape, and Kick questionnaire    Fear of Current or Ex-Partner: No    Emotionally Abused: No    Physically Abused: No    Sexually Abused: No      Family History  Problem Relation Age of Onset   Cancer Mother 4       lung   Thyroid disease Mother    Cancer Father 55       Pancreatic   Hypertension Sister    Cancer Sister        "cancer on face"   Hyperlipidemia Sister    Osteoporosis Maternal Grandmother    Cancer Paternal Grandmother        colon   Arthritis Paternal Grandfather    Hyperlipidemia Son    Seizures Son    Breast cancer Neg Hx        PHYSICAL EXAM: General:  Well appearing. No respiratory difficulty HEENT: normal Neck: supple. no JVD. Carotids 2+ bilat; no bruits. No lymphadenopathy or thyromegaly appreciated. Cor: PMI nondisplaced. Regular rate & rhythm. No rubs, gallops or murmurs. Lungs: clear Abdomen: soft, nontender, nondistended. No hepatosplenomegaly. No bruits or masses. Good bowel sounds. Extremities: no cyanosis, clubbing, rash, edema Neuro: alert & oriented  x 3, cranial nerves grossly intact. moves all 4 extremities w/o difficulty. Affect pleasant.  ECG:   ASSESSMENT & PLAN:  1: Chronic heart failure with reduced ejection fraction- - suspect due to - NYHA class - euvolemic - weighing daily   Delma Freeze, FNP 08/25/23

## 2023-08-26 ENCOUNTER — Encounter: Payer: Medicare HMO | Admitting: Family

## 2023-08-27 DIAGNOSIS — J441 Chronic obstructive pulmonary disease with (acute) exacerbation: Secondary | ICD-10-CM | POA: Diagnosis not present

## 2023-08-27 DIAGNOSIS — J440 Chronic obstructive pulmonary disease with acute lower respiratory infection: Secondary | ICD-10-CM | POA: Diagnosis not present

## 2023-08-27 DIAGNOSIS — A419 Sepsis, unspecified organism: Secondary | ICD-10-CM | POA: Diagnosis not present

## 2023-08-27 DIAGNOSIS — I4819 Other persistent atrial fibrillation: Secondary | ICD-10-CM | POA: Diagnosis not present

## 2023-08-27 DIAGNOSIS — J188 Other pneumonia, unspecified organism: Secondary | ICD-10-CM | POA: Diagnosis not present

## 2023-08-27 DIAGNOSIS — I5043 Acute on chronic combined systolic (congestive) and diastolic (congestive) heart failure: Secondary | ICD-10-CM | POA: Diagnosis not present

## 2023-08-27 DIAGNOSIS — J9621 Acute and chronic respiratory failure with hypoxia: Secondary | ICD-10-CM | POA: Diagnosis not present

## 2023-08-27 DIAGNOSIS — I130 Hypertensive heart and chronic kidney disease with heart failure and stage 1 through stage 4 chronic kidney disease, or unspecified chronic kidney disease: Secondary | ICD-10-CM | POA: Diagnosis not present

## 2023-08-27 DIAGNOSIS — J9622 Acute and chronic respiratory failure with hypercapnia: Secondary | ICD-10-CM | POA: Diagnosis not present

## 2023-08-27 NOTE — Progress Notes (Signed)
Advanced Heart Failure Clinic Note   Referring Physician: recent admit PCP: Marjie Skiff, NP (last seen 12/24) Cardiologist: Yvonne Kendall, MD (last seen 11/24)  Chief Complaint: fatigue  HPI:  Jordan Chan is a 73 y/o female with a history of B12 deficiency, chronic pain syndrome s/p intrathecal pump, paroxysmal atrial fibrillation, stress-induced cardiomyopathy in the setting of pain pump malfunction and opioid withdrawal (2018), HTN, hyperlipidemia, PE (2021), DM, COPD, GERD, OSA, breast cancer, chronic back pain,anemia and IBS.   Admitted 04/15/23 due to worsening of left hip pain at the surgical site associated with increasing redness about the left hip surgical wound. Tachycardic. WBC 17.6. CXR normal.Thought to be septic due to left hip surgical wound infection. Treated with IV fluids, analgesics and empiric IV antibiotics. Transferred to [facility].    Admitted 08/09/23 due to acute respiratory distress found with sats in the 50 to 60% on RA. Found to have multiple PE's and sepsis due to aspiration pneumonia. Initially intubated. Completed antibiotics & successfully extubated. Underwent swallowing evaluation. Heparin stopped and transitioned to eliquis. Anemia stable.   Echo 12/11/20: EF 45% with Grade II DD Echo 09/11/21: EF 25-30% with Grade II DD Echo 10/31/21: EF 40-45% with Grade I DD Echo 08/11/23: EF 25-30% with Grade I DD  She presents today, with her husband and SIL, for her initial HF clinic visit with a chief complaint of moderate fatigue with little exertion. Chronic in nature although she feels better than when she went to the hospital. Has associated shortness of breath, weakness, chronic back pain, constipation and chronic difficulty sleeping. She notices that when she eats, she does get full easily. Denies chest pain, cough, palpitations, abdominal distention, pedal edema, dizziness, nausea or weight gain.          Wearing oxygen at 2L around the  clock. Quit smoking 19 years ago when a grandchild was going to be born. Denies alcohol use. Not adding salt to her food. Weighing daily and notices a weight loss since being home.   She was discharged on ferrous sulfate, folic acid and MVI so the MVI was stopped after she got home.   Previous cardiac studies:  [facility] 04/28/17:  No angiographically significant coronary artery disease. Severely reduced LV contraction (LVEF 25-30%) with mid and apical akinesis, consistent with Takotsubo (stress-induced) cardiomyopathy. Mildly elevated left ventricular filling pressure.  Review of Systems: [y] = yes, [ ]  = no   General: Weight gain [ ] ; Weight loss Cove.Etienne ]; Anorexia [ ] ; Fatigue Cove.Etienne ]; Fever [ ] ; Chills [ ] ; Weakness [ y]  Cardiac: Chest pain/pressure [ ] ; Resting SOB [ ] ; Exertional SOB Cove.Etienne ]; Orthopnea [ ] ; Pedal Edema [ ] ; Palpitations [ ] ; Syncope [ ] ; Presyncope [ ] ; Paroxysmal nocturnal dyspnea[ ]   Pulmonary: Cough [ ] ; Wheezing[ ] ; Hemoptysis[ ] ; Sputum [ ] ; Snoring [ ]   GI: Vomiting[ ] ; Dysphagia[ ] ; Melena[ ] ; Hematochezia [ ] ; Heartburn[ ] ; Abdominal pain [ ] ; Constipation Cove.Etienne ]; Diarrhea [ ] ; BRBPR [ ]   GU: Hematuria[ ] ; Dysuria [ ] ; Nocturia[ ]   Vascular: Pain in legs with walking [ ] ; Pain in feet with lying flat [ ] ; Non-healing sores [ ] ; Stroke [ ] ; TIA [ ] ; Slurred speech [ ] ;  Neuro: Headaches[ ] ; Vertigo[ ] ; Seizures[ ] ; Paresthesias[ ] ;Blurred vision [ ] ; Diplopia [ ] ; Vision changes [ ]   Ortho/Skin: Arthritis [ ] ; Joint pain [ ] ; Muscle pain [ ] ; Joint swelling [ ] ; Back Pain Cove.Etienne ]; Rash [ ]   Psych: Depression[ ] ; Anxiety[y ]  Heme: Bleeding problems [ ] ; Clotting disorders [ ] ; Anemia [ ]   Endocrine: Diabetes Cove.Etienne ]; Thyroid dysfunction[ ]    Past Medical History:  Diagnosis Date   Arthritis    Asthma    Atrial fibrillation (HCC)    Breast cancer (HCC) 1998   right breast ca with mastectomy and chemotherapy and radiation   Bronchitis    CHF (congestive heart failure) (HCC)     "with Morphine withdrawal"   COPD (chronic obstructive pulmonary disease) (HCC)    Diabetes mellitus without complication (HCC)    Diverticulitis    diverticulosis also   Dyspnea    Endometriosis    GERD (gastroesophageal reflux disease)    History of shingles 2000-2005   Hypercholesteremia    Hypertension    IBS (irritable bowel syndrome)    Low back pain    a. Implanted morphine/bupivicaine/clonidine pump.   Neuropathy    Orthopnea    Oxygen dependent    uses at night   Personal history of chemotherapy    Personal history of radiation therapy    Pneumonia    pneumonia 5-6 times, history of bronchitis also   Scoliosis    Sleep apnea    does not use cpap   Stroke (HCC) 2010   TIA, 10 years ago   Withdrawal from sedative drug (HCC)    withdrawal from morphine when pump batteries died    Current Outpatient Medications  Medication Sig Dispense Refill   ACCU-CHEK AVIVA PLUS test strip TEST THREE TIMES DAILY (Patient not taking: Reported on 08/24/2023) 300 strip 3   Accu-Chek Softclix Lancets lancets TEST BLOOD SUGAR THREE TIMES DAILY (Patient not taking: Reported on 08/24/2023) 300 each 3   albuterol (PROVENTIL) (2.5 MG/3ML) 0.083% nebulizer solution Take 3 mLs (2.5 mg total) by nebulization every 4 (four) hours as needed for wheezing or shortness of breath. (Patient not taking: Reported on 08/24/2023) 75 mL 1   albuterol (VENTOLIN HFA) 108 (90 Base) MCG/ACT inhaler Inhale 2 puffs into the lungs every 6 (six) hours as needed for wheezing or shortness of breath. 1 each 1   Alcohol Swabs (DROPSAFE ALCOHOL PREP) 70 % PADS USE TWICE DAILY  WITH  SUGAR  CHECKS (Patient not taking: Reported on 08/24/2023) 200 each 2   alum & mag hydroxide-simeth (MAALOX/MYLANTA) 200-200-20 MG/5ML suspension Take 30 mLs by mouth every 6 (six) hours as needed for indigestion or heartburn. 300 mL 1   apixaban (ELIQUIS) 5 MG TABS tablet Take 1 tablet (5 mg total) by mouth 2 (two) times daily. 60 tablet 0    atorvastatin (LIPITOR) 40 MG tablet Take 1 tablet (40 mg total) by mouth daily. 30 tablet 1   Blood Glucose Monitoring Suppl (TRUE METRIX METER) w/Device KIT Use to check blood sugar 4 times a day (Patient not taking: Reported on 08/24/2023) 1 kit 4   busPIRone (BUSPAR) 5 MG tablet Take 1 tablet (5 mg total) by mouth 2 (two) times daily. 30 tablet 1   cholecalciferol (VITAMIN D3) 25 MCG (1000 UNIT) tablet Take 1,000 Units by mouth daily.     Continuous Glucose Receiver (FREESTYLE LIBRE 2 READER) DEVI To check blood sugars 3-4 times daily due to insulin use. DX E11.40 2 each 5   Continuous Glucose Sensor (FREESTYLE LIBRE 2 SENSOR) MISC To check blood sugars 3-4 times daily due to insulin use. DX E11.40 2 each 5   Cyanocobalamin 1000 MCG/ML KIT Inject 1,000 mcg as directed every 30 (thirty)  days.     docusate sodium (COLACE) 100 MG capsule Take 1 capsule (100 mg total) by mouth 2 (two) times daily as needed for mild constipation. 30 capsule 0   feeding supplement (ENSURE ENLIVE / ENSURE PLUS) LIQD Take 237 mLs by mouth 3 (three) times daily between meals. 237 mL 12   ferrous sulfate 325 (65 FE) MG EC tablet Take 1 tablet (325 mg total) by mouth 2 (two) times daily. 60 tablet 1   Fluticasone-Umeclidin-Vilant (TRELEGY ELLIPTA) 100-62.5-25 MCG/ACT AEPB INHALE 1 PUFF INTO THE LUNGS DAILY -42 DAY EXPIRATION AFTER OPENING FOIL TRAY 180 each 2   folic acid (FOLVITE) 1 MG tablet TAKE 1 TABLET EVERY DAY 90 tablet 0   furosemide (LASIX) 40 MG tablet Take 0.5 tablets (20 mg total) by mouth daily. 30 tablet 1   gabapentin (NEURONTIN) 400 MG capsule Take 1 capsule (400 mg total) by mouth 3 (three) times daily. 90 capsule 1   insulin lispro (HUMALOG KWIKPEN) 100 UNIT/ML KwikPen Inject 5 Units into the skin 3 (three) times daily before meals. Do not inject insulin if you are not going to eat meal.  Only take before a meal. 15 mL 3   losartan (COZAAR) 25 MG tablet Take 0.5 tablets (12.5 mg total) by mouth daily. 30  tablet 1   metFORMIN (GLUCOPHAGE) 1000 MG tablet Take 1 tablet (1,000 mg total) by mouth 2 (two) times daily with a meal. 60 tablet 1   metoprolol tartrate (LOPRESSOR) 25 MG tablet Take 0.5 tablets (12.5 mg total) by mouth 2 (two) times daily. 30 tablet 1   montelukast (SINGULAIR) 10 MG tablet Take 1 tablet (10 mg total) by mouth at bedtime. 30 tablet 0   Multiple Vitamin (MULTIVITAMIN WITH MINERALS) TABS tablet Take 1 tablet by mouth daily. 30 tablet 1   naloxone (NARCAN) nasal spray 4 mg/0.1 mL Place 1 spray into the nose as needed for up to 365 doses (for opioid-induced respiratory depresssion). In case of emergency (overdose), spray once into each nostril. If no response within 3 minutes, repeat application and call 911. 1 each 1   Nebulizers (EASY AIR COMPRESSOR NEBULIZER) MISC Use to inhaler nebulizer treatments at needed per instructions on nebulizer prescription (Patient not taking: Reported on 08/24/2023) 1 each 0   ondansetron (ZOFRAN) 4 MG tablet Take 1 tablet (4 mg total) by mouth daily as needed for nausea or vomiting. 30 tablet 1   OXYGEN Inhale 2 L into the lungs at bedtime.     PAIN MANAGEMENT INTRATHECAL, IT, PUMP 1 each by Intrathecal route. Intrathecal (IT) medication:  Morphine 5.0 mg/ml, Bupivacaine 20.0 mg/ml,  Clonidine 100.0 mcg/ml Patient does not remember current. Adjusted every 3 months at [facility]     pantoprazole (PROTONIX) 40 MG tablet Take 1 tablet (40 mg total) by mouth daily. 30 tablet 1   polyethylene glycol (MIRALAX / GLYCOLAX) 17 g packet Take 17 g by mouth daily as needed for moderate constipation. 10 packet 0   polyvinyl alcohol (LIQUIFILM TEARS) 1.4 % ophthalmic solution Place 2 drops into both eyes as needed for dry eyes. 10 mL 1   TRESIBA FLEXTOUCH 100 UNIT/ML FlexTouch Pen INJECT 55 UNITS SUBCUTANEOUSLY AT BEDTIME 30 mL 0   TRULICITY 4.5 MG/0.5ML SOPN INJECT 4.5MG  INTO THE SKIN ONCE WEEKLY AS DIRECTED 12 mL 0   venlafaxine XR (EFFEXOR-XR) 150 MG  24 hr capsule Take 1 capsule (150 mg total) by mouth daily. 30 capsule 0   No current facility-administered medications for  this visit.    Allergies  Allergen Reactions   Other Palpitations    IV steroids   Pain Patch [Menthol] Anaphylaxis   Avelox [Moxifloxacin Hcl In Nacl] Other (See Comments)    Upset stomach   Doxycycline Diarrhea   Erythromycin Nausea Only and Other (See Comments)    Can take a Z-Pak just fine Other reaction(s): Other (See Comments) Can take Z-Pak  Can take a Z-Pak just fine   Fentanyl Nausea Only and Rash   Moxifloxacin Hcl Other (See Comments)    Upset stomach Upset stomach   Oxycontin [Oxycodone] Hives   Ozempic [Semaglutide] Nausea Only      Social History   Socioeconomic History   Marital status: Unknown    Spouse name: Harvie Heck   Number of children: 2   Years of education: Not on file   Highest education level: High school graduate  Occupational History   Occupation: retired  Tobacco Use   Smoking status: Former    Current packs/day: 0.00    Average packs/day: 1 pack/day for 30.0 years (30.0 ttl pk-yrs)    Types: Cigarettes    Start date: 04/30/1974    Quit date: 04/30/2004    Years since quitting: 19.3   Smokeless tobacco: Never  Vaping Use   Vaping status: Never Used  Substance and Sexual Activity   Alcohol use: No   Drug use: No   Sexual activity: Not Currently    Birth control/protection: Post-menopausal  Other Topics Concern   Not on file  Social History Narrative   ** Merged History Encounter **       Social Drivers of Health   Financial Resource Strain: Low Risk  (08/20/2023)   Overall Financial Resource Strain (CARDIA)    Difficulty of Paying Living Expenses: Not hard at all  Food Insecurity: No Food Insecurity (08/24/2023)   Hunger Vital Sign    Worried About Running Out of Food in the Last Year: Never true    Ran Out of Food in the Last Year: Never true  Transportation Needs: No Transportation Needs (08/24/2023)    PRAPARE - Administrator, Civil Service (Medical): No    Lack of Transportation (Non-Medical): No  Physical Activity: Inactive (12/23/2022)   Exercise Vital Sign    Days of Exercise per Week: 0 days    Minutes of Exercise per Session: 0 min  Stress: No Stress Concern Present (12/23/2022)   [employer] of Occupational Health - Occupational Stress Questionnaire    Feeling of Stress : Only a little  Social Connections: Socially Integrated (08/24/2023)   Social Connection and Isolation Panel [NHANES]    Frequency of Communication with Friends and Family: More than three times a week    Frequency of Social Gatherings with Friends and Family: More than three times a week    Attends Religious Services: More than 4 times per year    Active Member of [employer] or Organizations: Yes    Attends Engineer, structural: More than 4 times per year    Marital Status: Married  Catering manager Violence: Not At Risk (08/24/2023)   Humiliation, Afraid, Rape, and Kick questionnaire    Fear of Current or Ex-Partner: No    Emotionally Abused: No    Physically Abused: No    Sexually Abused: No      Family History  Problem Relation Age of Onset   Cancer Mother 87       lung   Thyroid disease Mother  Cancer Father 61       Pancreatic   Hypertension Sister    Cancer Sister        "cancer on face"   Hyperlipidemia Sister    Osteoporosis Maternal Grandmother    Cancer Paternal Grandmother        colon   Arthritis Paternal Grandfather    Hyperlipidemia Son    Seizures Son    Breast cancer Neg Hx    Vitals:   08/28/23 0942  BP: 125/67  Pulse: 98  SpO2: 98%  Weight: 135 lb 6 oz (61.4 kg)   Wt Readings from Last 3 Encounters:  08/28/23 135 lb 6 oz (61.4 kg)  08/22/23 149 lb 0.5 oz (67.6 kg)  08/04/23 144 lb (65.3 kg)   Lab Results  Component Value Date   CREATININE 0.65 08/21/2023   CREATININE 0.67 08/19/2023   CREATININE 0.85 08/17/2023   PHYSICAL EXAM: General:   Well appearing. No respiratory difficulty HEENT: normal Neck: supple. no JVD. Carotids 2+ bilat; no bruits. No lymphadenopathy or thyromegaly appreciated. Cor: PMI nondisplaced. Regular rate & irregular rhythm. No rubs, gallops or murmurs. Lungs: clear Abdomen: soft, nontender, nondistended. No hepatosplenomegaly. No bruits or masses. Good bowel sounds. Extremities: no cyanosis, clubbing, rash, edema Neuro: alert & oriented x 3, cranial nerves grossly intact. moves all 4 extremities w/o difficulty. Affect pleasant.  ECG: not done   ASSESSMENT & PLAN:  1: NICM with reduced ejection fraction- - suspect due to stress induced after pain pump malfunction & opioid withdrawal in 2018 - NYHA class III - euvolemic - weighing daily; reminded to call for overnight weight gain of > 2 pounds or weekly weight gain of > 5 pounds - Echo 12/11/20: EF 45% with Grade II DD - Echo 09/11/21: EF 25-30% with Grade II DD - Echo 10/31/21: EF 40-45% with Grade I DD - Echo 08/11/23: EF 25-30% with Grade I DD - continue furosemide 20mg  daily - continue losartan 12.5mg  daily; consider transitioning to entresto if BP maintains - continue metoprolol tartrate 12.5mg  BID - discussed adding spironolactone at next visit if BP maintains watching K+ closely as she's been both hypo/ hyperkalemic in the past - had yeast infection/ dry mouth with previous jardiance - not adding salt to her food - BMET today - BNP 08/09/23 was 241.9  2: HTN- - BP 125/67 - saw PCP Harvest Dark) 12/24 - BMET 08/21/23 showed sodium 139, potassium 4.0, creatinine 0.65 & GFR >60 - BMET today  3: DM- - A1c 06/22/23 was 7.4% - continue insulin - continue metformin 1000mg  BID  4: COPD- - saw pulmonology Meredeth Ide) 11/24 - wearing oxygen at 2L around the clock - lungs CTA today  5: Atrial fibrillation- - saw cardiology (End) 11/24 - continue apixaban 5mg  BID - continue metoprolol tartrate 12.5mg  BID  6: Chronic pain- - follows with pain  clinic - has chronic intrathecal pain pump - continue gabapentin 400mg  TID  7: Anemia- - Hg 08/22/23 was 7.7 - sees GI Gerre Pebbles) 03/25 - started ferrous sulfate 325mg  BID at hospital discharge 08/22/23 - CBC today   Return in 3-4 weeks, sooner if needed.    Jordan Freeze, FNP 08/27/23

## 2023-08-27 NOTE — Telephone Encounter (Signed)
Called and gave verbal orders per Jolene.  

## 2023-08-28 ENCOUNTER — Ambulatory Visit: Payer: Medicare HMO | Attending: Family | Admitting: Family

## 2023-08-28 ENCOUNTER — Encounter: Payer: Self-pay | Admitting: Family

## 2023-08-28 VITALS — BP 125/67 | HR 98 | Wt 135.4 lb

## 2023-08-28 DIAGNOSIS — K589 Irritable bowel syndrome without diarrhea: Secondary | ICD-10-CM | POA: Insufficient documentation

## 2023-08-28 DIAGNOSIS — Z7984 Long term (current) use of oral hypoglycemic drugs: Secondary | ICD-10-CM | POA: Insufficient documentation

## 2023-08-28 DIAGNOSIS — E7800 Pure hypercholesterolemia, unspecified: Secondary | ICD-10-CM | POA: Insufficient documentation

## 2023-08-28 DIAGNOSIS — J449 Chronic obstructive pulmonary disease, unspecified: Secondary | ICD-10-CM

## 2023-08-28 DIAGNOSIS — M25552 Pain in left hip: Secondary | ICD-10-CM | POA: Diagnosis not present

## 2023-08-28 DIAGNOSIS — I110 Hypertensive heart disease with heart failure: Secondary | ICD-10-CM | POA: Diagnosis not present

## 2023-08-28 DIAGNOSIS — J9621 Acute and chronic respiratory failure with hypoxia: Secondary | ICD-10-CM | POA: Diagnosis not present

## 2023-08-28 DIAGNOSIS — G4733 Obstructive sleep apnea (adult) (pediatric): Secondary | ICD-10-CM | POA: Diagnosis not present

## 2023-08-28 DIAGNOSIS — Z9221 Personal history of antineoplastic chemotherapy: Secondary | ICD-10-CM | POA: Diagnosis not present

## 2023-08-28 DIAGNOSIS — E119 Type 2 diabetes mellitus without complications: Secondary | ICD-10-CM | POA: Diagnosis not present

## 2023-08-28 DIAGNOSIS — I480 Paroxysmal atrial fibrillation: Secondary | ICD-10-CM | POA: Diagnosis not present

## 2023-08-28 DIAGNOSIS — I130 Hypertensive heart and chronic kidney disease with heart failure and stage 1 through stage 4 chronic kidney disease, or unspecified chronic kidney disease: Secondary | ICD-10-CM | POA: Diagnosis not present

## 2023-08-28 DIAGNOSIS — Z794 Long term (current) use of insulin: Secondary | ICD-10-CM | POA: Insufficient documentation

## 2023-08-28 DIAGNOSIS — G894 Chronic pain syndrome: Secondary | ICD-10-CM | POA: Insufficient documentation

## 2023-08-28 DIAGNOSIS — Z853 Personal history of malignant neoplasm of breast: Secondary | ICD-10-CM | POA: Insufficient documentation

## 2023-08-28 DIAGNOSIS — K219 Gastro-esophageal reflux disease without esophagitis: Secondary | ICD-10-CM | POA: Diagnosis not present

## 2023-08-28 DIAGNOSIS — I5043 Acute on chronic combined systolic (congestive) and diastolic (congestive) heart failure: Secondary | ICD-10-CM | POA: Diagnosis not present

## 2023-08-28 DIAGNOSIS — M549 Dorsalgia, unspecified: Secondary | ICD-10-CM | POA: Diagnosis not present

## 2023-08-28 DIAGNOSIS — Z9981 Dependence on supplemental oxygen: Secondary | ICD-10-CM | POA: Diagnosis not present

## 2023-08-28 DIAGNOSIS — D649 Anemia, unspecified: Secondary | ICD-10-CM | POA: Insufficient documentation

## 2023-08-28 DIAGNOSIS — Z87891 Personal history of nicotine dependence: Secondary | ICD-10-CM | POA: Diagnosis not present

## 2023-08-28 DIAGNOSIS — Z86711 Personal history of pulmonary embolism: Secondary | ICD-10-CM | POA: Diagnosis not present

## 2023-08-28 DIAGNOSIS — D508 Other iron deficiency anemias: Secondary | ICD-10-CM

## 2023-08-28 DIAGNOSIS — J441 Chronic obstructive pulmonary disease with (acute) exacerbation: Secondary | ICD-10-CM | POA: Diagnosis not present

## 2023-08-28 DIAGNOSIS — I428 Other cardiomyopathies: Secondary | ICD-10-CM | POA: Insufficient documentation

## 2023-08-28 DIAGNOSIS — I5020 Unspecified systolic (congestive) heart failure: Secondary | ICD-10-CM

## 2023-08-28 DIAGNOSIS — A419 Sepsis, unspecified organism: Secondary | ICD-10-CM | POA: Diagnosis not present

## 2023-08-28 DIAGNOSIS — E1140 Type 2 diabetes mellitus with diabetic neuropathy, unspecified: Secondary | ICD-10-CM | POA: Diagnosis not present

## 2023-08-28 DIAGNOSIS — Z7985 Long-term (current) use of injectable non-insulin antidiabetic drugs: Secondary | ICD-10-CM | POA: Diagnosis not present

## 2023-08-28 DIAGNOSIS — I10 Essential (primary) hypertension: Secondary | ICD-10-CM

## 2023-08-28 DIAGNOSIS — J440 Chronic obstructive pulmonary disease with acute lower respiratory infection: Secondary | ICD-10-CM | POA: Diagnosis not present

## 2023-08-28 DIAGNOSIS — Z7901 Long term (current) use of anticoagulants: Secondary | ICD-10-CM | POA: Diagnosis not present

## 2023-08-28 DIAGNOSIS — I4819 Other persistent atrial fibrillation: Secondary | ICD-10-CM

## 2023-08-28 DIAGNOSIS — Z79899 Other long term (current) drug therapy: Secondary | ICD-10-CM | POA: Insufficient documentation

## 2023-08-28 DIAGNOSIS — J4489 Other specified chronic obstructive pulmonary disease: Secondary | ICD-10-CM | POA: Diagnosis not present

## 2023-08-28 DIAGNOSIS — J9622 Acute and chronic respiratory failure with hypercapnia: Secondary | ICD-10-CM | POA: Diagnosis not present

## 2023-08-28 DIAGNOSIS — J188 Other pneumonia, unspecified organism: Secondary | ICD-10-CM | POA: Diagnosis not present

## 2023-08-28 NOTE — Patient Instructions (Addendum)
Go DOWN to LOWER LEVEL (LL) to have your blood work completed inside of [employer] office.  We will only call you if the results are abnormal or if the provider would like to make medication changes.   It was a pleasure meeting you today!

## 2023-08-29 LAB — CBC
Hematocrit: 32.2 % — ABNORMAL LOW (ref 34.0–46.6)
Hemoglobin: 9.8 g/dL — ABNORMAL LOW (ref 11.1–15.9)
MCH: 28.2 pg (ref 26.6–33.0)
MCHC: 30.4 g/dL — ABNORMAL LOW (ref 31.5–35.7)
MCV: 93 fL (ref 79–97)
Platelets: 572 10*3/uL — ABNORMAL HIGH (ref 150–450)
RBC: 3.47 x10E6/uL — ABNORMAL LOW (ref 3.77–5.28)
RDW: 14.1 % (ref 11.7–15.4)
WBC: 11.3 10*3/uL — ABNORMAL HIGH (ref 3.4–10.8)

## 2023-08-29 LAB — BASIC METABOLIC PANEL
BUN/Creatinine Ratio: 30 — ABNORMAL HIGH (ref 12–28)
BUN: 31 mg/dL — ABNORMAL HIGH (ref 8–27)
CO2: 30 mmol/L — ABNORMAL HIGH (ref 20–29)
Calcium: 9.8 mg/dL (ref 8.7–10.3)
Chloride: 92 mmol/L — ABNORMAL LOW (ref 96–106)
Creatinine, Ser: 1.05 mg/dL — ABNORMAL HIGH (ref 0.57–1.00)
Glucose: 166 mg/dL — ABNORMAL HIGH (ref 70–99)
Potassium: 5 mmol/L (ref 3.5–5.2)
Sodium: 139 mmol/L (ref 134–144)
eGFR: 56 mL/min/{1.73_m2} — ABNORMAL LOW (ref >59)

## 2023-08-30 ENCOUNTER — Other Ambulatory Visit: Payer: Self-pay | Admitting: Nurse Practitioner

## 2023-08-30 NOTE — Patient Instructions (Signed)
Be Involved in Caring For Your Health:  Taking Medications When medications are taken as directed, they can greatly improve your health. But if they are not taken as prescribed, they may not work. In some cases, not taking them correctly can be harmful. To help ensure your treatment remains effective and safe, understand your medications and how to take them. Bring your medications to each visit for review by your provider.  Your lab results, notes, and after visit summary will be available on My Chart. We strongly encourage you to use this feature. If lab results are abnormal the clinic will contact you with the appropriate steps. If the clinic does not contact you assume the results are satisfactory. You can always view your results on My Chart. If you have questions regarding your health or results, please contact the clinic during office hours. You can also ask questions on My Chart.  We at [facility] are grateful that you chose [place] to provide your care. We strive to provide evidence-based and compassionate care and are always looking for feedback. If you get a survey from the clinic please complete this so we can hear your opinions.  Community-Acquired Pneumonia, Adult Pneumonia is an infection of the lungs. It causes irritation and swelling in the airways of the lungs. Mucus and fluid may also build up inside the airways. This may cause coughing and trouble breathing. One type of pneumonia can happen while you are in a hospital. A different type can happen when you are not in a hospital (community-acquired pneumonia). What are the causes?  This condition is caused by germs (viruses, bacteria, or fungi). Some types of germs can spread from person to person. Pneumonia is not thought to spread from person to person. What increases the risk? You have a long-term (chronic) disease, such as: Disease of the lungs. This may be chronic obstructive pulmonary disease (COPD) or asthma. Heart  failure. Cystic fibrosis. Diabetes. Kidney disease. Sickle cell disease. HIV. You have other health problems, such as: Your body's defense system (immune system) is weak. A condition that may cause you to breathe in fluids from your mouth and nose. You had your spleen taken out. You do not take good care of your teeth and mouth (poor dental hygiene). You use or have used tobacco products. You go where the germs that cause this illness are common. You are older than 74 years of age. What are the signs or symptoms? A cough. A fever. Sweating or chills. Chest pain, often when you breathe deeply or cough. Breathing problems, such as: Fast breathing. Trouble breathing. Shortness of breath. Feeling tired (fatigued). Muscle aches. How is this treated? Treatment for this condition depends on many things, such as: The cause of your illness. Your medicines. Your other health problems. Most adults can be treated at home. Sometimes, treatment must happen in a hospital. Treatment may include medicines to kill germs. Medicines may depend on which germ caused your illness. Very bad pneumonia is rare. If you get it, you may: Have a machine to help you breathe. Have fluid taken away from around your lungs. Follow these instructions at home: Medicines Take over-the-counter and prescription medicines only as told by your doctor. Take cough medicine only if you are losing sleep. Cough medicine can keep your body from taking mucus away from your lungs. If you were prescribed antibiotics, take them as told by your doctor. Do not stop taking them even if you start to feel better. Lifestyle  Do not smoke or use any products that contain nicotine or tobacco. If you need help quitting, ask your doctor. Do not drink alcohol. Eat a healthy diet. This includes a lot of vegetables, fruits, whole grains, low-fat dairy products, and low-fat (lean) protein. General instructions  Rest a lot. Sleep  for at least 8 hours each night. Sleep with your head and neck raised. Put a few pillows under your head or sleep in a reclining chair. Return to your normal activities as told by your doctor. Ask your doctor what activities are safe for you. Drink enough fluid to keep your pee (urine) pale yellow. If your throat is sore, gargle with a mixture of salt and water 3-4 times a day or as needed. To make salt water, completely dissolve -1 tsp (3-6 g) of salt in 1 cup (237 mL) of warm water. Keep all follow-up visits. How is this prevented? Getting the pneumonia shot (vaccine). These shots have different types and schedules. Ask your doctor what works best for you. Think about getting this shot if: You are older than 74 years of age. You are 25-63 years of age and: You are being treated for cancer. You have long-term lung disease. You have other problems that affect your body's defense system. Ask your doctor if you have one of these. Getting your flu shot every year. Ask your doctor which type of shot is best for you. Going to the dentist as often as told. Washing your hands often with soap and water for at least 20 seconds. If you cannot use soap and water, use hand sanitizer. Contact a doctor if: You have a fever. You lose sleep because your cough medicine does not help. Get help right away if: You are short of breath and this gets worse. You have more chest pain. Your sickness gets worse. This is very serious if: You are an older adult. Your body's defense system is weak. You cough up blood. These symptoms may be an emergency. Get help right away. Call 911. Do not wait to see if the symptoms will go away. Do not drive yourself to the hospital. Summary Pneumonia is an infection of the lungs. Community-acquired pneumonia affects people who have not been in the hospital. Certain germs can cause this infection. This condition may be treated with medicines that kill germs. For very bad  pneumonia, you may need a hospital stay and treatment to help with breathing. This information is not intended to replace advice given to you by your health care provider. Make sure you discuss any questions you have with your health care provider. Document Revised: 09/18/2021 Document Reviewed: 09/18/2021 Elsevier Patient Education  2024 [employer].

## 2023-08-31 ENCOUNTER — Ambulatory Visit: Payer: Medicare HMO | Admitting: Physician Assistant

## 2023-08-31 ENCOUNTER — Telehealth: Payer: Self-pay | Admitting: Nurse Practitioner

## 2023-08-31 DIAGNOSIS — J441 Chronic obstructive pulmonary disease with (acute) exacerbation: Secondary | ICD-10-CM | POA: Diagnosis not present

## 2023-08-31 DIAGNOSIS — J9622 Acute and chronic respiratory failure with hypercapnia: Secondary | ICD-10-CM | POA: Diagnosis not present

## 2023-08-31 DIAGNOSIS — J188 Other pneumonia, unspecified organism: Secondary | ICD-10-CM | POA: Diagnosis not present

## 2023-08-31 DIAGNOSIS — I5043 Acute on chronic combined systolic (congestive) and diastolic (congestive) heart failure: Secondary | ICD-10-CM | POA: Diagnosis not present

## 2023-08-31 DIAGNOSIS — J9621 Acute and chronic respiratory failure with hypoxia: Secondary | ICD-10-CM | POA: Diagnosis not present

## 2023-08-31 DIAGNOSIS — I4819 Other persistent atrial fibrillation: Secondary | ICD-10-CM | POA: Diagnosis not present

## 2023-08-31 DIAGNOSIS — I130 Hypertensive heart and chronic kidney disease with heart failure and stage 1 through stage 4 chronic kidney disease, or unspecified chronic kidney disease: Secondary | ICD-10-CM | POA: Diagnosis not present

## 2023-08-31 DIAGNOSIS — J440 Chronic obstructive pulmonary disease with acute lower respiratory infection: Secondary | ICD-10-CM | POA: Diagnosis not present

## 2023-08-31 DIAGNOSIS — A419 Sepsis, unspecified organism: Secondary | ICD-10-CM | POA: Diagnosis not present

## 2023-08-31 NOTE — Telephone Encounter (Signed)
Called and gave verbal orders per Jolene.

## 2023-08-31 NOTE — Telephone Encounter (Signed)
Copied from CRM [id number]. Topic: Quick Communication - Home Health Verbal Orders >> Aug 31, 2023  8:59 AM Everette C wrote: Caller/Agency: Delorise Jackson Number: [phone] Service Requested: Occupational Therapy Frequency: 1w8 Any new concerns about the patient? No

## 2023-09-01 NOTE — Telephone Encounter (Signed)
Requested Prescriptions  Refused Prescriptions Disp Refills   metoprolol succinate (TOPROL-XL) 25 MG 24 hr tablet [Pharmacy Med Name: Metoprolol Succinate ER 25 MG Oral Tablet Extended Release 24 Hour] 45 tablet 0    Sig: Take 1/2 (one-half) tablet by mouth once daily     Cardiovascular:  Beta Blockers Passed - 09/01/2023 10:05 AM      Passed - Last BP in normal range    BP Readings from Last 1 Encounters:  08/28/23 125/67         Passed - Last Heart Rate in normal range    Pulse Readings from Last 1 Encounters:  08/28/23 98         Passed - Valid encounter within last 6 months    Recent Outpatient Visits           1 month ago Chronic HFrEF (heart failure with reduced ejection fraction) (HCC)   [facility] [place], [place] T, NP   1 month ago SOB (shortness of breath) on exertion   [facility] [place], [place] T, NP   1 month ago SOB (shortness of breath)   [facility] [place], Megan P, DO   2 months ago Insulin dependent type 2 diabetes mellitus (HCC)   [facility] [place], [place] T, NP   5 months ago Insulin dependent type 2 diabetes mellitus (HCC)   [place] [place], Dorie Rank, NP       Future Appointments             In 3 days [name], Dorie Rank, NP [facility], [facility]   In 1 week [place], Dorie Rank, NP [facility], [facility]

## 2023-09-03 DIAGNOSIS — I130 Hypertensive heart and chronic kidney disease with heart failure and stage 1 through stage 4 chronic kidney disease, or unspecified chronic kidney disease: Secondary | ICD-10-CM | POA: Diagnosis not present

## 2023-09-03 DIAGNOSIS — J440 Chronic obstructive pulmonary disease with acute lower respiratory infection: Secondary | ICD-10-CM | POA: Diagnosis not present

## 2023-09-03 DIAGNOSIS — J9622 Acute and chronic respiratory failure with hypercapnia: Secondary | ICD-10-CM | POA: Diagnosis not present

## 2023-09-03 DIAGNOSIS — J441 Chronic obstructive pulmonary disease with (acute) exacerbation: Secondary | ICD-10-CM | POA: Diagnosis not present

## 2023-09-03 DIAGNOSIS — I5043 Acute on chronic combined systolic (congestive) and diastolic (congestive) heart failure: Secondary | ICD-10-CM | POA: Diagnosis not present

## 2023-09-03 DIAGNOSIS — J188 Other pneumonia, unspecified organism: Secondary | ICD-10-CM | POA: Diagnosis not present

## 2023-09-03 DIAGNOSIS — A419 Sepsis, unspecified organism: Secondary | ICD-10-CM | POA: Diagnosis not present

## 2023-09-03 DIAGNOSIS — J9621 Acute and chronic respiratory failure with hypoxia: Secondary | ICD-10-CM | POA: Diagnosis not present

## 2023-09-03 DIAGNOSIS — I4819 Other persistent atrial fibrillation: Secondary | ICD-10-CM | POA: Diagnosis not present

## 2023-09-04 ENCOUNTER — Ambulatory Visit (INDEPENDENT_AMBULATORY_CARE_PROVIDER_SITE_OTHER): Payer: Medicare HMO | Admitting: Nurse Practitioner

## 2023-09-04 ENCOUNTER — Encounter: Payer: Self-pay | Admitting: Nurse Practitioner

## 2023-09-04 VITALS — BP 105/74 | HR 96 | Temp 97.9°F | Ht 58.2 in | Wt 136.6 lb

## 2023-09-04 DIAGNOSIS — Z794 Long term (current) use of insulin: Secondary | ICD-10-CM | POA: Diagnosis not present

## 2023-09-04 DIAGNOSIS — E538 Deficiency of other specified B group vitamins: Secondary | ICD-10-CM | POA: Diagnosis not present

## 2023-09-04 DIAGNOSIS — J449 Chronic obstructive pulmonary disease, unspecified: Secondary | ICD-10-CM | POA: Diagnosis not present

## 2023-09-04 DIAGNOSIS — E119 Type 2 diabetes mellitus without complications: Secondary | ICD-10-CM | POA: Diagnosis not present

## 2023-09-04 DIAGNOSIS — E1169 Type 2 diabetes mellitus with other specified complication: Secondary | ICD-10-CM

## 2023-09-04 DIAGNOSIS — D508 Other iron deficiency anemias: Secondary | ICD-10-CM

## 2023-09-04 DIAGNOSIS — I4819 Other persistent atrial fibrillation: Secondary | ICD-10-CM

## 2023-09-04 DIAGNOSIS — I5022 Chronic systolic (congestive) heart failure: Secondary | ICD-10-CM | POA: Diagnosis not present

## 2023-09-04 DIAGNOSIS — J189 Pneumonia, unspecified organism: Secondary | ICD-10-CM

## 2023-09-04 DIAGNOSIS — Z86711 Personal history of pulmonary embolism: Secondary | ICD-10-CM | POA: Diagnosis not present

## 2023-09-04 DIAGNOSIS — I428 Other cardiomyopathies: Secondary | ICD-10-CM | POA: Diagnosis not present

## 2023-09-04 MED ORDER — CYANOCOBALAMIN 1000 MCG/ML IJ SOLN
1000.0000 ug | Freq: Once | INTRAMUSCULAR | Status: AC
  Administered 2023-09-04: 1000 ug via INTRAMUSCULAR

## 2023-09-04 NOTE — Assessment & Plan Note (Signed)
Acute on 08/09/23, this is 3rd admission with PNA in one year.  Monitor closely. Follows with pulmonary, continue this collaboration, plus cardiology.  Recent notes with them reviewed. Plan on repeat imaging around February 10th to 17th.  Labs today.

## 2023-09-04 NOTE — Assessment & Plan Note (Signed)
Stable.  Currently being followed by pulmonary, continue this collaboration.

## 2023-09-04 NOTE — Progress Notes (Signed)
BP 105/74   Pulse 96   Temp 97.9 F (36.6 C) (Oral)   Ht 4' 10.2" (1.478 m)   Wt 136 lb 9.6 oz (62 kg)   LMP  (LMP Unknown)   SpO2 98%   BMI 28.35 kg/m    Subjective:    [name], female    DOB: [birth year], 73 y.o.   MRN: [id number]  HPI: Jordan Chan is a 73 y.o. female  Chief Complaint  Patient presents with   Hospitalization Follow-up   Transition of Care Hospital Follow up.  Admitted to [facility] on 08/09/2023 for acute respiratory distress, was intubated at the time and extubated on 08/15/23 due to pneumonia and PE.  Discharged from hospital on 08/22/2023.  Last A1c in office in November was 7.4%. CBC at discharge from hospital did note low H/H with slight trend up and elevation in WBC + kidney function noted some mild reduction from baseline. She was placed on iron supplement at discharge., 325 MG BID. Had pneumonia with admission 3 times in 2024 -- April, September, and Jordan.  Has underlying COPD and PE. At baseline does use oxygen at 2L.  Last saw pulmonary 06/09/23.  Saw HF Clinic on 08/28/23, EF on 08/11/23 at 25-30%.  She reports SOB is a little bit better, but PT had her walk down without O2 on and maintained sats at 88%.  Overall doing better, not 100% but doing better.  PT/OT and RN come to her house one day a week at present.  "Hospital Course: 73 y.o female with significant PMH of B12 deficiency, chronic pain syndrome s/p intrathecal pump, paroxysmal atrial fibrillation, stress-induced cardiomyopathy in the setting of pain pump malfunction and opioid withdrawal, PE following hospitalization for pneumonia, DM, COPD, GERD, OSA, and IBS who presented to the ED with chief complaints of acute respiratory distress found with sats in the 50 to 60% on RA per EMS.  Respiratory failure, likely CHF and bilateral pneumonia. Extubated 08/15/23 and did very well.   Assessment and Plan: Acute on chronic respiratory failure with hypoxia and hypercapnia (HCC)  secondary to Multiple subsegmental pulmonary emboli without acute cor pulmonale and Sepsis due to Aspiration pneumonia (HCC) Leukocytosis improved At home on 2LPM O2 [place] at night time. Currently requiring 2LPM O2 [place] continuous.  Aspiration pneumonia: completed abx course. Continue on bronchodilators & encourage incentive spirometry   Continue eliquis for PE Underwent swallow eval--> Regular;Thin liquid (with cut meats) -> diet orders changed Performed IS Q4hrs while awake   Toxic metabolic encephalopathy: almost improved likely multifactorial- hospital/ICU delirium, narcotic use from chronic pain, infection. Much improved. Re-orient prn. Continue w/ supportive care    Diabetes mellitus: Mildly uncontrolled, Cont Lantus 12 units daily, gave sliding scale insulin as needed, resumed Metformin 1000mg  BID at discharge Follow fingersticks   Generalized weakness: PT recommended Wheel chair and Home health PT   Chronic pain: continue on home dose of gabapentin   Circulatory shock: see Dr. Doreene Adas notes on how met circulatory shock criteria. Resolved   PAF: HR in 90s Continue eliquis.  Cont metoprolol at 12.5mg  BID   Chronic systolic CHF: resume lasix 20mg  daily  Monitor I/Os.  Continue losartan low dose Continue metoprolol   Acute hypoxic respiratory failure: continue on supplemental oxygen and wean as tolerated   PE: d/ced IV heparin.  Cont eliquis    B/l pleural effusions: cont lasix 20mg  daily    AKI: resolved    Depression: severity unknown. Continue on home dose  of buspar, venlafaxine    Anemia: Repeat Hb is stable at 7.7--> Denies any melena/ blood in stools.  It appears that her baseline Hb is around 11 based on chart review Iron studies: with low Iron--> started Ferrous sulphate BID tablets at discharge,  folate wnl 12.4, B12 wnl 725 Repeat CBC at PCP visit in 3-4 days"  Hospital/Facility: [facility] D/C Physician: Dr. Mariane Masters D/C Date: 08/22/23  Records Requested:  09/04/23 Records Received: 09/04/23 Records Reviewed: 09/04/23  Diagnoses on Discharge: Multiple subsegmental pulmonary emboli without acute cor pulmonale and pneumonia  Date of interactive Contact within 48 hours of discharge:  Contact was through: phone  Date of 7 day or 14 day face-to-face visit:    within 14 days  Outpatient Encounter Medications as of 09/04/2023  Medication Sig Note   ACCU-CHEK AVIVA PLUS test strip TEST THREE TIMES DAILY 08/24/2023: Uses Libre device    Accu-Chek Softclix Lancets lancets TEST BLOOD SUGAR THREE TIMES DAILY    albuterol (PROVENTIL) (2.5 MG/3ML) 0.083% nebulizer solution Take 3 mLs (2.5 mg total) by nebulization every 4 (four) hours as needed for wheezing or shortness of breath. 08/24/2023: Moved recently can't find neb machine    albuterol (VENTOLIN HFA) 108 (90 Base) MCG/ACT inhaler Inhale 2 puffs into the lungs every 6 (six) hours as needed for wheezing or shortness of breath.    Alcohol Swabs (DROPSAFE ALCOHOL PREP) 70 % PADS USE TWICE DAILY  WITH  SUGAR  CHECKS    alum & mag hydroxide-simeth (MAALOX/MYLANTA) 200-200-20 MG/5ML suspension Take 30 mLs by mouth every 6 (six) hours as needed for indigestion or heartburn.    apixaban (ELIQUIS) 5 MG TABS tablet Take 1 tablet (5 mg total) by mouth 2 (two) times daily.    atorvastatin (LIPITOR) 40 MG tablet Take 1 tablet (40 mg total) by mouth daily.    Blood Glucose Monitoring Suppl (TRUE METRIX METER) w/Device KIT Use to check blood sugar 4 times a day    busPIRone (BUSPAR) 5 MG tablet Take 1 tablet (5 mg total) by mouth 2 (two) times daily.    cholecalciferol (VITAMIN D3) 25 MCG (1000 UNIT) tablet Take 1,000 Units by mouth daily.    Continuous Glucose Receiver (FREESTYLE LIBRE 2 READER) DEVI To check blood sugars 3-4 times daily due to insulin use. DX E11.40    Continuous Glucose Sensor (FREESTYLE LIBRE 2 SENSOR) MISC To check blood sugars 3-4 times daily due to insulin use. DX E11.40    Cyanocobalamin 1000  MCG/ML KIT Inject 1,000 mcg as directed every 30 (thirty) days.    docusate sodium (COLACE) 100 MG capsule Take 1 capsule (100 mg total) by mouth 2 (two) times daily as needed for mild constipation.    feeding supplement (ENSURE ENLIVE / ENSURE PLUS) LIQD Take 237 mLs by mouth 3 (three) times daily between meals.    Ferrous Sulfate (IRON) 325 (65 Fe) MG TABS Take 1 tablet by mouth 2 (two) times daily.    ferrous sulfate 325 (65 FE) MG EC tablet Take 1 tablet (325 mg total) by mouth 2 (two) times daily.    Fluticasone-Umeclidin-Vilant (TRELEGY ELLIPTA) 100-62.5-25 MCG/ACT AEPB INHALE 1 PUFF INTO THE LUNGS DAILY -42 DAY EXPIRATION AFTER OPENING FOIL TRAY    folic acid (FOLVITE) 1 MG tablet TAKE 1 TABLET EVERY DAY    furosemide (LASIX) 40 MG tablet Take 0.5 tablets (20 mg total) by mouth daily.    gabapentin (NEURONTIN) 400 MG capsule Take 1 capsule (400 mg total) by mouth 3 (  three) times daily.    insulin lispro (HUMALOG KWIKPEN) 100 UNIT/ML KwikPen Inject 5 Units into the skin 3 (three) times daily before meals. Do not inject insulin if you are not going to eat meal.  Only take before a meal. 08/24/2023: Verified 5 units with her    losartan (COZAAR) 25 MG tablet Take 0.5 tablets (12.5 mg total) by mouth daily.    metFORMIN (GLUCOPHAGE) 1000 MG tablet Take 1 tablet (1,000 mg total) by mouth 2 (two) times daily with a meal.    metoprolol tartrate (LOPRESSOR) 25 MG tablet Take 0.5 tablets (12.5 mg total) by mouth 2 (two) times daily.    montelukast (SINGULAIR) 10 MG tablet Take 1 tablet (10 mg total) by mouth at bedtime.    Multiple Vitamin (MULTIVITAMIN WITH MINERALS) TABS tablet Take 1 tablet by mouth daily.    naloxone (NARCAN) nasal spray 4 mg/0.1 mL Place 1 spray into the nose as needed for up to 365 doses (for opioid-induced respiratory depresssion). In case of emergency (overdose), spray once into each nostril. If no response within 3 minutes, repeat application and call 911.    Nebulizers (EASY  AIR COMPRESSOR NEBULIZER) MISC Use to inhaler nebulizer treatments at needed per instructions on nebulizer prescription    ondansetron (ZOFRAN) 4 MG tablet Take 1 tablet (4 mg total) by mouth daily as needed for nausea or vomiting.    OXYGEN Inhale 2 L into the lungs at bedtime.    PAIN MANAGEMENT INTRATHECAL, IT, PUMP 1 each by Intrathecal route. Intrathecal (IT) medication:  Morphine 5.0 mg/ml, Bupivacaine 20.0 mg/ml,  Clonidine 100.0 mcg/ml Patient does not remember current. Adjusted every 3 months at [facility] 04/16/2023: Morphine, Bupivacaine and clonidine    pantoprazole (PROTONIX) 40 MG tablet Take 1 tablet (40 mg total) by mouth daily.    polyethylene glycol (MIRALAX / GLYCOLAX) 17 g packet Take 17 g by mouth daily as needed for moderate constipation.    polyvinyl alcohol (LIQUIFILM TEARS) 1.4 % ophthalmic solution Place 2 drops into both eyes as needed for dry eyes.    TRESIBA FLEXTOUCH 100 UNIT/ML FlexTouch Pen INJECT 55 UNITS SUBCUTANEOUSLY AT BEDTIME    TRULICITY 4.5 MG/0.5ML SOPN INJECT 4.5MG  INTO THE SKIN ONCE WEEKLY AS DIRECTED    venlafaxine XR (EFFEXOR-XR) 150 MG 24 hr capsule Take 1 capsule (150 mg total) by mouth daily.    No facility-administered encounter medications on file as of 09/04/2023.    Diagnostic Tests Reviewed/Disposition:     Latest Ref Rng & Units 08/28/2023   10:35 AM 08/22/2023    4:53 AM 08/21/2023    6:27 AM  CBC  WBC 3.4 - 10.8 x10E3/uL 11.3  10.7  9.5   Hemoglobin 11.1 - 15.9 g/dL 9.8  7.7  7.7   Hematocrit 34.0 - 46.6 % 32.2  24.4  25.2   Platelets 150 - 450 x10E3/uL 572  514  528        Latest Ref Rng & Units 08/28/2023   10:35 AM 08/21/2023    6:27 AM 08/19/2023   10:14 AM  CMP  Glucose 70 - 99 mg/dL 742  595  638   BUN 8 - 27 mg/dL 31  10  12    Creatinine 0.57 - 1.00 mg/dL 7.56  4.33  2.95   Sodium 134 - 144 mmol/L 139  139  136   Potassium 3.5 - 5.2 mmol/L 5.0  4.0  3.9   Chloride 96 - 106 mmol/L 92  97  93  CO2 20 - 29 mmol/L  30  33  30   Calcium 8.7 - 10.3 mg/dL 9.8  8.8  8.7     Consults: Pulmonary  Discharge Instructions: Follow-up with PCP and cardiology & CBC/BMP  Disease/illness Education: Reviewed at length with patient today.  Home Health/Community Services Discussions/Referrals: PT and RN home health ordered -- PT recommended Wheelchair  Establishment or re-establishment of referral orders for community resources:None  Discussion with other health care providers: Reviewed all recent notes  Assessment and Support of treatment regimen adherence: Reviewed at length with patient today.  Appointments Coordinated with: Reviewed at length with patient today.  Education for self-management, independent living, and ADLs:  Reviewed at length with patient today.  Relevant past medical, surgical, family and social history reviewed and updated as indicated. Interim medical history since our last visit reviewed. Allergies and medications reviewed and updated.  Review of Systems  Constitutional:  Positive for appetite change (lower recently) and fatigue. Negative for activity change, chills, fever and unexpected weight change.  Respiratory:  Negative for cough, chest tightness, shortness of breath and wheezing.   Cardiovascular:  Negative for chest pain, palpitations and leg swelling.  Gastrointestinal: Negative.   Endocrine: Negative for polydipsia, polyphagia and polyuria.  Neurological: Negative.   Psychiatric/Behavioral: Negative.     Per HPI unless specifically indicated above     Objective:    BP 105/74   Pulse 96   Temp 97.9 F (36.6 C) (Oral)   Ht 4' 10.2" (1.478 m)   Wt 136 lb 9.6 oz (62 kg)   LMP  (LMP Unknown)   SpO2 98%   BMI 28.35 kg/m   Wt Readings from Last 3 Encounters:  09/04/23 136 lb 9.6 oz (62 kg)  08/28/23 135 lb 6 oz (61.4 kg)  08/22/23 149 lb 0.5 oz (67.6 kg)    Physical Exam Vitals and nursing note reviewed.  Constitutional:      General: She is awake. She is not  in acute distress.    Appearance: She is well-developed and well-groomed. She is not ill-appearing or toxic-appearing.  HENT:     Head: Normocephalic.     Right Ear: Hearing and external ear normal.     Left Ear: Hearing and external ear normal.  Eyes:     General: Lids are normal.        Right eye: No discharge.        Left eye: No discharge.     Conjunctiva/sclera: Conjunctivae normal.     Pupils: Pupils are equal, round, and reactive to light.  Neck:     Thyroid: No thyromegaly.     Vascular: No carotid bruit.  Cardiovascular:     Rate and Rhythm: Normal rate and regular rhythm.     Heart sounds: Normal heart sounds. No murmur heard.    No gallop.  Pulmonary:     Effort: Pulmonary effort is normal. No accessory muscle usage or respiratory distress.     Breath sounds: Wheezing present. No decreased breath sounds or rales.     Comments: A few scattered expiratory wheezes.  No SOB with talking or walking into office.  O2 on at 2L. Abdominal:     General: Bowel sounds are normal. There is no distension.     Palpations: Abdomen is soft.     Tenderness: There is no abdominal tenderness.  Musculoskeletal:     Cervical back: Normal range of motion and neck supple.     Right lower leg: No edema.  Left lower leg: No edema.  Lymphadenopathy:     Cervical: No cervical adenopathy.  Skin:    General: Skin is warm and dry.  Neurological:     Mental Status: She is alert and oriented to person, place, and time.     Deep Tendon Reflexes: Reflexes are normal and symmetric.     Reflex Scores:      Brachioradialis reflexes are 2+ on the right side and 2+ on the left side.      Patellar reflexes are 2+ on the right side and 2+ on the left side. Psychiatric:        Attention and Perception: Attention normal.        Mood and Affect: Mood normal.        Speech: Speech normal.        Behavior: Behavior normal. Behavior is cooperative.        Thought Content: Thought content normal.     Results for orders placed or performed in visit on 08/28/23  Basic metabolic panel   Collection Time: 08/28/23 10:35 AM  Result Value Ref Range   Glucose 166 (H) 70 - 99 mg/dL   BUN 31 (H) 8 - 27 mg/dL   Creatinine, Ser 1.61 (H) 0.57 - 1.00 mg/dL   eGFR 56 (L) >09 UE/AVW/0.98   BUN/Creatinine Ratio 30 (H) 12 - 28   Sodium 139 134 - 144 mmol/L   Potassium 5.0 3.5 - 5.2 mmol/L   Chloride 92 (L) 96 - 106 mmol/L   CO2 30 (H) 20 - 29 mmol/L   Calcium 9.8 8.7 - 10.3 mg/dL  CBC   Collection Time: 08/28/23 10:35 AM  Result Value Ref Range   WBC 11.3 (H) 3.4 - 10.8 x10E3/uL   RBC 3.47 (L) 3.77 - 5.28 x10E6/uL   Hemoglobin 9.8 (L) 11.1 - 15.9 g/dL   Hematocrit 11.9 (L) 14.7 - 46.6 %   MCV 93 79 - 97 fL   MCH 28.2 26.6 - 33.0 pg   MCHC 30.4 (L) 31.5 - 35.7 g/dL   RDW 82.9 56.2 - 13.0 %   Platelets 572 (H) 150 - 450 x10E3/uL      Assessment & Plan:   Problem List Items Addressed This Visit       Cardiovascular and Mediastinum   Chronic HFrEF (heart failure with reduced ejection fraction) (HCC) (Chronic)   Chronic, ongoing with most recent 08/11/23 was 25-30%, a decline from previous EF 45-50% (10/22/22), followed by cardiology and HF Clinic.  Euvolemic today.  Continue current medication regimen and collaboration with cardiology.  Recommend: - Reminded to call for an overnight weight gain of >2 pounds or a weekly weight gain of >5 pounds - not adding salt to food and read food labels. Reviewed the importance of keeping daily sodium intake to 2000mg  daily - Avoid Ibuprofen products      Relevant Orders   Ferritin   Iron   Nonischemic cardiomyopathy (HCC)   Chronic, ongoing in setting of PE's in past.  Continue current medication regimen and collaboration with cardiology.  Recent notes reviewed.      Persistent atrial fibrillation (HCC)   Chronic, stable.  Rate well controlled.  Continue current medications and collaboration with cardiology.        Respiratory   COPD,  moderate (HCC) (Chronic)   Relevant Orders   CBC with Differential/Platelet   Pneumonia of both lower lobes due to infectious organism   Acute on 08/09/23, this is 3rd admission with PNA in  one year.  Monitor closely. Follows with pulmonary, continue this collaboration, plus cardiology.  Recent notes with them reviewed. Plan on repeat imaging around February 10th to 17th.  Labs today.        Endocrine   Insulin dependent type 2 diabetes mellitus (HCC) - Primary   Chronic, ongoing, insulin dependent. A1c 7.4% in November, previous 6.7% last check.   - Will continue Metformin at max dosing, Trulicity 4.5 MG weekly, and Tresiba 50 units, increase 3 units in 3 days if fasting sugar not less then 130 consistently recommended.  Continue meal time insulin, 2-5 units prior to meals and will increase as needed.  Educated her on this.   - Freestyle use continues. - Recommend she continue to monitor BS consistently at home and document + focus heavily on diet changes.  She is aware to notify provider if fasting BS >130 consistently or <70, as may need to adjust insulin further.   - In past poorly tolerated Jardiance and Mounjaro -- discussed with her that and how SGLT2 would benefit HF -- however concern for side effects.   - Foot exam up to date, needs eye exam. - Vaccinations up to date.        Relevant Orders   Comprehensive metabolic panel     Other   History of pulmonary embolus (PE)   Stable.  Currently being followed by pulmonary, continue this collaboration.      Iron deficiency anemia   Ongoing and stable.  Will continue to collaborate with hematology as needed, recent notes and labs reviewed.  Will check CBC today due to recent lower HGB at hospital and check iron.      Relevant Medications   Ferrous Sulfate (IRON) 325 (65 Fe) MG TABS     Follow up plan: Return for as scheduled February 10th.

## 2023-09-04 NOTE — Assessment & Plan Note (Signed)
Chronic, ongoing in setting of PE's in past.  Continue current medication regimen and collaboration with cardiology.  Recent notes reviewed.

## 2023-09-04 NOTE — Assessment & Plan Note (Signed)
Ongoing and stable.  Will continue to collaborate with hematology as needed, recent notes and labs reviewed.  Will check CBC today due to recent lower HGB at hospital and check iron.

## 2023-09-04 NOTE — Addendum Note (Signed)
Addended by: Pablo Ledger on: 09/04/2023 01:57 PM   Modules accepted: Orders

## 2023-09-04 NOTE — Assessment & Plan Note (Signed)
Chronic, ongoing with most recent 08/11/23 was 25-30%, a decline from previous EF 45-50% (10/22/22), followed by cardiology and HF Clinic.  Euvolemic today.  Continue current medication regimen and collaboration with cardiology.  Recommend: - Reminded to call for an overnight weight gain of >2 pounds or a weekly weight gain of >5 pounds - not adding salt to food and read food labels. Reviewed the importance of keeping daily sodium intake to 2000mg  daily - Avoid Ibuprofen products

## 2023-09-04 NOTE — Assessment & Plan Note (Signed)
 Chronic, stable.  Rate well controlled.  Continue current medications and collaboration with cardiology.

## 2023-09-04 NOTE — Assessment & Plan Note (Signed)
Chronic, ongoing, insulin dependent. A1c 7.4% in November, previous 6.7% last check.   - Will continue Metformin at max dosing, Trulicity 4.5 MG weekly, and Tresiba 50 units, increase 3 units in 3 days if fasting sugar not less then 130 consistently recommended.  Continue meal time insulin, 2-5 units prior to meals and will increase as needed.  Educated her on this.   - Freestyle use continues. - Recommend she continue to monitor BS consistently at home and document + focus heavily on diet changes.  She is aware to notify provider if fasting BS >130 consistently or <70, as may need to adjust insulin further.   - In past poorly tolerated Jardiance and Mounjaro -- discussed with her that and how SGLT2 would benefit HF -- however concern for side effects.   - Foot exam up to date, needs eye exam. - Vaccinations up to date.

## 2023-09-05 LAB — COMPREHENSIVE METABOLIC PANEL
ALT: 11 [IU]/L (ref 0–32)
AST: 16 [IU]/L (ref 0–40)
Albumin: 4.3 g/dL (ref 3.8–4.8)
Alkaline Phosphatase: 95 [IU]/L (ref 44–121)
BUN/Creatinine Ratio: 15 (ref 12–28)
BUN: 19 mg/dL (ref 8–27)
Bilirubin Total: 0.2 mg/dL (ref 0.0–1.2)
CO2: 25 mmol/L (ref 20–29)
Calcium: 9.5 mg/dL (ref 8.7–10.3)
Chloride: 95 mmol/L — ABNORMAL LOW (ref 96–106)
Creatinine, Ser: 1.29 mg/dL — ABNORMAL HIGH (ref 0.57–1.00)
Globulin, Total: 3.6 g/dL (ref 1.5–4.5)
Glucose: 151 mg/dL — ABNORMAL HIGH (ref 70–99)
Potassium: 3.9 mmol/L (ref 3.5–5.2)
Sodium: 139 mmol/L (ref 134–144)
Total Protein: 7.9 g/dL (ref 6.0–8.5)
eGFR: 44 mL/min/{1.73_m2} — ABNORMAL LOW (ref >59)

## 2023-09-05 LAB — FERRITIN: Ferritin: 34 ng/mL (ref 15–150)

## 2023-09-05 LAB — CBC WITH DIFFERENTIAL/PLATELET
Basophils Absolute: 0.1 10*3/uL (ref 0.0–0.2)
Basos: 1 %
EOS (ABSOLUTE): 0.3 10*3/uL (ref 0.0–0.4)
Eos: 3 %
Hematocrit: 32 % — ABNORMAL LOW (ref 34.0–46.6)
Hemoglobin: 9.9 g/dL — ABNORMAL LOW (ref 11.1–15.9)
Immature Grans (Abs): 0 10*3/uL (ref 0.0–0.1)
Immature Granulocytes: 0 %
Lymphocytes Absolute: 3.1 10*3/uL (ref 0.7–3.1)
Lymphs: 28 %
MCH: 27.9 pg (ref 26.6–33.0)
MCHC: 30.9 g/dL — ABNORMAL LOW (ref 31.5–35.7)
MCV: 90 fL (ref 79–97)
Monocytes Absolute: 0.9 10*3/uL (ref 0.1–0.9)
Monocytes: 8 %
Neutrophils Absolute: 6.8 10*3/uL (ref 1.4–7.0)
Neutrophils: 60 %
Platelets: 465 10*3/uL — ABNORMAL HIGH (ref 150–450)
RBC: 3.55 x10E6/uL — ABNORMAL LOW (ref 3.77–5.28)
RDW: 13.7 % (ref 11.7–15.4)
WBC: 11.2 10*3/uL — ABNORMAL HIGH (ref 3.4–10.8)

## 2023-09-05 LAB — IRON: Iron: 76 ug/dL (ref 27–139)

## 2023-09-06 ENCOUNTER — Encounter: Payer: Self-pay | Admitting: Nurse Practitioner

## 2023-09-06 DIAGNOSIS — J9622 Acute and chronic respiratory failure with hypercapnia: Secondary | ICD-10-CM | POA: Diagnosis not present

## 2023-09-06 DIAGNOSIS — E1122 Type 2 diabetes mellitus with diabetic chronic kidney disease: Secondary | ICD-10-CM

## 2023-09-06 DIAGNOSIS — N1831 Chronic kidney disease, stage 3a: Secondary | ICD-10-CM

## 2023-09-06 DIAGNOSIS — J9621 Acute and chronic respiratory failure with hypoxia: Secondary | ICD-10-CM | POA: Diagnosis not present

## 2023-09-06 DIAGNOSIS — J188 Other pneumonia, unspecified organism: Secondary | ICD-10-CM | POA: Diagnosis not present

## 2023-09-06 DIAGNOSIS — J440 Chronic obstructive pulmonary disease with acute lower respiratory infection: Secondary | ICD-10-CM | POA: Diagnosis not present

## 2023-09-06 DIAGNOSIS — J441 Chronic obstructive pulmonary disease with (acute) exacerbation: Secondary | ICD-10-CM | POA: Diagnosis not present

## 2023-09-06 DIAGNOSIS — Z794 Long term (current) use of insulin: Secondary | ICD-10-CM | POA: Diagnosis not present

## 2023-09-06 DIAGNOSIS — E1142 Type 2 diabetes mellitus with diabetic polyneuropathy: Secondary | ICD-10-CM

## 2023-09-06 DIAGNOSIS — A419 Sepsis, unspecified organism: Secondary | ICD-10-CM | POA: Diagnosis not present

## 2023-09-06 DIAGNOSIS — I4819 Other persistent atrial fibrillation: Secondary | ICD-10-CM | POA: Diagnosis not present

## 2023-09-06 DIAGNOSIS — I130 Hypertensive heart and chronic kidney disease with heart failure and stage 1 through stage 4 chronic kidney disease, or unspecified chronic kidney disease: Secondary | ICD-10-CM | POA: Diagnosis not present

## 2023-09-06 NOTE — Progress Notes (Signed)
Contacted via MyChart   Good morning Jordan Chan, your labs have returned: - CBC continues to show some anemia.  Please ensure to add iron rich foods to diet and take supplement as instructed, including multivitamin.  White blood cell count remains a little elevated in regards to infection.  We will recheck both at next visit. - Kidney function continues to show Stage 3a kidney diease with no worsening.  We will monitor closely.  Any questions? Keep being stellar!!  Thank you for allowing me to participate in your care.  I appreciate you. Kindest regards, Shaterria Sager

## 2023-09-07 DIAGNOSIS — I130 Hypertensive heart and chronic kidney disease with heart failure and stage 1 through stage 4 chronic kidney disease, or unspecified chronic kidney disease: Secondary | ICD-10-CM | POA: Diagnosis not present

## 2023-09-07 DIAGNOSIS — I5043 Acute on chronic combined systolic (congestive) and diastolic (congestive) heart failure: Secondary | ICD-10-CM | POA: Diagnosis not present

## 2023-09-07 DIAGNOSIS — J188 Other pneumonia, unspecified organism: Secondary | ICD-10-CM | POA: Diagnosis not present

## 2023-09-07 DIAGNOSIS — J9621 Acute and chronic respiratory failure with hypoxia: Secondary | ICD-10-CM | POA: Diagnosis not present

## 2023-09-07 DIAGNOSIS — J441 Chronic obstructive pulmonary disease with (acute) exacerbation: Secondary | ICD-10-CM | POA: Diagnosis not present

## 2023-09-07 DIAGNOSIS — A419 Sepsis, unspecified organism: Secondary | ICD-10-CM | POA: Diagnosis not present

## 2023-09-07 DIAGNOSIS — J440 Chronic obstructive pulmonary disease with acute lower respiratory infection: Secondary | ICD-10-CM | POA: Diagnosis not present

## 2023-09-07 DIAGNOSIS — J9622 Acute and chronic respiratory failure with hypercapnia: Secondary | ICD-10-CM | POA: Diagnosis not present

## 2023-09-07 DIAGNOSIS — I4819 Other persistent atrial fibrillation: Secondary | ICD-10-CM | POA: Diagnosis not present

## 2023-09-08 DIAGNOSIS — J9621 Acute and chronic respiratory failure with hypoxia: Secondary | ICD-10-CM | POA: Diagnosis not present

## 2023-09-08 DIAGNOSIS — J440 Chronic obstructive pulmonary disease with acute lower respiratory infection: Secondary | ICD-10-CM | POA: Diagnosis not present

## 2023-09-08 DIAGNOSIS — A419 Sepsis, unspecified organism: Secondary | ICD-10-CM | POA: Diagnosis not present

## 2023-09-08 DIAGNOSIS — J9622 Acute and chronic respiratory failure with hypercapnia: Secondary | ICD-10-CM | POA: Diagnosis not present

## 2023-09-08 DIAGNOSIS — I130 Hypertensive heart and chronic kidney disease with heart failure and stage 1 through stage 4 chronic kidney disease, or unspecified chronic kidney disease: Secondary | ICD-10-CM | POA: Diagnosis not present

## 2023-09-08 DIAGNOSIS — I4819 Other persistent atrial fibrillation: Secondary | ICD-10-CM | POA: Diagnosis not present

## 2023-09-08 DIAGNOSIS — J188 Other pneumonia, unspecified organism: Secondary | ICD-10-CM | POA: Diagnosis not present

## 2023-09-08 DIAGNOSIS — J441 Chronic obstructive pulmonary disease with (acute) exacerbation: Secondary | ICD-10-CM | POA: Diagnosis not present

## 2023-09-08 DIAGNOSIS — I5043 Acute on chronic combined systolic (congestive) and diastolic (congestive) heart failure: Secondary | ICD-10-CM | POA: Diagnosis not present

## 2023-09-09 DIAGNOSIS — I130 Hypertensive heart and chronic kidney disease with heart failure and stage 1 through stage 4 chronic kidney disease, or unspecified chronic kidney disease: Secondary | ICD-10-CM | POA: Diagnosis not present

## 2023-09-09 DIAGNOSIS — J9621 Acute and chronic respiratory failure with hypoxia: Secondary | ICD-10-CM | POA: Diagnosis not present

## 2023-09-09 DIAGNOSIS — J441 Chronic obstructive pulmonary disease with (acute) exacerbation: Secondary | ICD-10-CM | POA: Diagnosis not present

## 2023-09-09 DIAGNOSIS — J9622 Acute and chronic respiratory failure with hypercapnia: Secondary | ICD-10-CM | POA: Diagnosis not present

## 2023-09-09 DIAGNOSIS — J440 Chronic obstructive pulmonary disease with acute lower respiratory infection: Secondary | ICD-10-CM | POA: Diagnosis not present

## 2023-09-09 DIAGNOSIS — I4819 Other persistent atrial fibrillation: Secondary | ICD-10-CM | POA: Diagnosis not present

## 2023-09-09 DIAGNOSIS — A419 Sepsis, unspecified organism: Secondary | ICD-10-CM | POA: Diagnosis not present

## 2023-09-09 DIAGNOSIS — J188 Other pneumonia, unspecified organism: Secondary | ICD-10-CM | POA: Diagnosis not present

## 2023-09-09 DIAGNOSIS — I5043 Acute on chronic combined systolic (congestive) and diastolic (congestive) heart failure: Secondary | ICD-10-CM | POA: Diagnosis not present

## 2023-09-14 ENCOUNTER — Encounter: Payer: Self-pay | Admitting: Nurse Practitioner

## 2023-09-14 ENCOUNTER — Other Ambulatory Visit: Payer: Self-pay

## 2023-09-14 ENCOUNTER — Ambulatory Visit: Payer: Self-pay | Admitting: Nurse Practitioner

## 2023-09-14 DIAGNOSIS — I152 Hypertension secondary to endocrine disorders: Secondary | ICD-10-CM

## 2023-09-14 DIAGNOSIS — J449 Chronic obstructive pulmonary disease, unspecified: Secondary | ICD-10-CM

## 2023-09-14 DIAGNOSIS — E119 Type 2 diabetes mellitus without complications: Secondary | ICD-10-CM

## 2023-09-14 DIAGNOSIS — E559 Vitamin D deficiency, unspecified: Secondary | ICD-10-CM

## 2023-09-14 DIAGNOSIS — F324 Major depressive disorder, single episode, in partial remission: Secondary | ICD-10-CM

## 2023-09-14 DIAGNOSIS — E1142 Type 2 diabetes mellitus with diabetic polyneuropathy: Secondary | ICD-10-CM

## 2023-09-14 DIAGNOSIS — E1169 Type 2 diabetes mellitus with other specified complication: Secondary | ICD-10-CM

## 2023-09-14 DIAGNOSIS — I5022 Chronic systolic (congestive) heart failure: Secondary | ICD-10-CM

## 2023-09-14 DIAGNOSIS — F1120 Opioid dependence, uncomplicated: Secondary | ICD-10-CM

## 2023-09-14 DIAGNOSIS — I4819 Other persistent atrial fibrillation: Secondary | ICD-10-CM

## 2023-09-14 DIAGNOSIS — J189 Pneumonia, unspecified organism: Secondary | ICD-10-CM

## 2023-09-14 DIAGNOSIS — N1831 Chronic kidney disease, stage 3a: Secondary | ICD-10-CM

## 2023-09-14 MED ORDER — PAIN MANAGEMENT IT PUMP REFILL: 1.0000 | Freq: Once | INTRATHECAL | 0 refills | Status: AC

## 2023-09-16 ENCOUNTER — Encounter: Payer: Self-pay | Admitting: Nurse Practitioner

## 2023-09-16 ENCOUNTER — Ambulatory Visit: Payer: Medicare HMO | Admitting: Nurse Practitioner

## 2023-09-16 ENCOUNTER — Ambulatory Visit: Payer: Self-pay | Admitting: *Deleted

## 2023-09-16 VITALS — BP 114/67 | HR 90 | Temp 98.7°F | Ht 58.2 in | Wt 139.4 lb

## 2023-09-16 DIAGNOSIS — E1169 Type 2 diabetes mellitus with other specified complication: Secondary | ICD-10-CM

## 2023-09-16 DIAGNOSIS — I700 Atherosclerosis of aorta: Secondary | ICD-10-CM

## 2023-09-16 DIAGNOSIS — I4819 Other persistent atrial fibrillation: Secondary | ICD-10-CM | POA: Diagnosis not present

## 2023-09-16 DIAGNOSIS — I5022 Chronic systolic (congestive) heart failure: Secondary | ICD-10-CM

## 2023-09-16 DIAGNOSIS — I152 Hypertension secondary to endocrine disorders: Secondary | ICD-10-CM

## 2023-09-16 DIAGNOSIS — N1831 Chronic kidney disease, stage 3a: Secondary | ICD-10-CM

## 2023-09-16 DIAGNOSIS — Z794 Long term (current) use of insulin: Secondary | ICD-10-CM | POA: Diagnosis not present

## 2023-09-16 DIAGNOSIS — E559 Vitamin D deficiency, unspecified: Secondary | ICD-10-CM | POA: Diagnosis not present

## 2023-09-16 DIAGNOSIS — E785 Hyperlipidemia, unspecified: Secondary | ICD-10-CM | POA: Diagnosis not present

## 2023-09-16 DIAGNOSIS — G894 Chronic pain syndrome: Secondary | ICD-10-CM

## 2023-09-16 DIAGNOSIS — E1159 Type 2 diabetes mellitus with other circulatory complications: Secondary | ICD-10-CM | POA: Diagnosis not present

## 2023-09-16 DIAGNOSIS — E1142 Type 2 diabetes mellitus with diabetic polyneuropathy: Secondary | ICD-10-CM | POA: Diagnosis not present

## 2023-09-16 DIAGNOSIS — F324 Major depressive disorder, single episode, in partial remission: Secondary | ICD-10-CM | POA: Diagnosis not present

## 2023-09-16 DIAGNOSIS — E119 Type 2 diabetes mellitus without complications: Secondary | ICD-10-CM | POA: Diagnosis not present

## 2023-09-16 DIAGNOSIS — J449 Chronic obstructive pulmonary disease, unspecified: Secondary | ICD-10-CM | POA: Diagnosis not present

## 2023-09-16 LAB — MICROALBUMIN, URINE WAIVED
Creatinine, Urine Waived: 200 mg/dL (ref 10–300)
Microalb, Ur Waived: 80 mg/L — ABNORMAL HIGH (ref 0–19)
Microalb/Creat Ratio: <30 mg/g (ref <30)

## 2023-09-16 LAB — BAYER DCA HB A1C WAIVED: HB A1C (BAYER DCA - WAIVED): 6.4 % — ABNORMAL HIGH (ref 4.8–5.6)

## 2023-09-16 NOTE — Assessment & Plan Note (Signed)
Ongoing and stable recent labs.  Continue Losartan at low dose for heart and kidney protection.  Labs CBC and CMP.  Will refer to nephrology if any worsening in future.  May need to reduce Metformin if eGFR <45.

## 2023-09-16 NOTE — Assessment & Plan Note (Signed)
Chronic, stable. Rare use of Albuterol. Continue Trelegy which offers benefit of medication minimization and has benefited symptoms.  Continue to collaborate with Dr. Meredeth Ide, recent notes reviewed.  Continue O2 use as ordered.

## 2023-09-16 NOTE — Assessment & Plan Note (Signed)
Chronic.  Continue to collaborate with chronic pain management locally, recent notes reviewed.

## 2023-09-16 NOTE — Assessment & Plan Note (Signed)
Ongoing.  Noted on CT 12/11/19.  Continue daily statin.  Monitor closely for symptoms.

## 2023-09-16 NOTE — Patient Instructions (Addendum)
Be Involved in Caring For Your Health:  Taking Medications When medications are taken as directed, they can greatly improve your health. But if they are not taken as prescribed, they may not work. In some cases, not taking them correctly can be harmful. To help ensure your treatment remains effective and safe, understand your medications and how to take them. Bring your medications to each visit for review by your provider.  Your lab results, notes, and after visit summary will be available on My Chart. We strongly encourage you to use this feature. If lab results are abnormal the clinic will contact you with the appropriate steps. If the clinic does not contact you assume the results are satisfactory. You can always view your results on My Chart. If you have questions regarding your health or results, please contact the clinic during office hours. You can also ask questions on My Chart.  We at [facility] are grateful that you chose [place] to provide your care. We strive to provide evidence-based and compassionate care and are always looking for feedback. If you get a survey from the clinic please complete this so we can hear your opinions.  Diabetes Mellitus and Foot Care Diabetes, also called diabetes mellitus, may cause problems with your feet and legs because of poor blood flow (circulation). Poor circulation may make your skin: Become thinner and drier. Break more easily. Heal more slowly. Peel and crack. You may also have nerve damage (neuropathy). This can cause decreased feeling in your legs and feet. This means that you may not notice minor injuries to your feet that could lead to more serious problems. Finding and treating problems early is the best way to prevent future foot problems. How to care for your feet Foot hygiene  Wash your feet daily with warm water and mild soap. Do not use hot water. Then, pat your feet and the areas between your toes until they are fully dry. Do  not soak your feet. This can dry your skin. Trim your toenails straight across. Do not dig under them or around the cuticle. File the edges of your nails with an emery board or nail file. Apply a moisturizing lotion or petroleum jelly to the skin on your feet and to dry, brittle toenails. Use lotion that does not contain alcohol and is unscented. Do not apply lotion between your toes. Shoes and socks Wear clean socks or stockings every day. Make sure they are not too tight. Do not wear knee-high stockings. These may decrease blood flow to your legs. Wear shoes that fit well and have enough cushioning. Always look in your shoes before you put them on to be sure there are no objects inside. To break in new shoes, wear them for just a few hours a day. This prevents injuries on your feet. Wounds, scrapes, corns, and calluses  Check your feet daily for blisters, cuts, bruises, sores, and redness. If you cannot see the bottom of your feet, use a mirror or ask someone for help. Do not cut off corns or calluses or try to remove them with medicine. If you find a minor scrape, cut, or break in the skin on your feet, keep it and the skin around it clean and dry. You may clean these areas with mild soap and water. Do not clean the area with peroxide, alcohol, or iodine. If you have a wound, scrape, corn, or callus on your foot, look at it several times a day to make sure it  is healing and not infected. Check for: Redness, swelling, or pain. Fluid or blood. Warmth. Pus or a bad smell. General tips Do not cross your legs. This may decrease blood flow to your feet. Do not use heating pads or hot water bottles on your feet. They may burn your skin. If you have lost feeling in your feet or legs, you may not know this is happening until it is too late. Protect your feet from hot and cold by wearing shoes, such as at the beach or on hot pavement. Schedule a complete foot exam at least once a year or more often if  you have foot problems. Report any cuts, sores, or bruises to your health care provider right away. Where to find more information American Diabetes Association: diabetes.org Association of Diabetes Care & Education Specialists: diabeteseducator.org Contact a health care provider if: You have a condition that increases your risk of infection, and you have any cuts, sores, or bruises on your feet. You have an injury that is not healing. You have redness on your legs or feet. You feel burning or tingling in your legs or feet. You have pain or cramps in your legs and feet. Your legs or feet are numb. Your feet always feel cold. You have pain around any toenails. Get help right away if: You have a wound, scrape, corn, or callus on your foot and: You have signs of infection. You have a fever. You have a red line going up your leg. This information is not intended to replace advice given to you by your health care provider. Make sure you discuss any questions you have with your health care provider. Document Revised: 01/22/2022 Document Reviewed: 01/22/2022 Elsevier Patient Education  2024 [employer].

## 2023-09-16 NOTE — Assessment & Plan Note (Signed)
Chronic, ongoing with last A1c 7.4% in November, will recheck this today + check urine ALB.  Adjust regimen as needed.  Did not tolerate SGLT2 in past + Mounjaro made her sick.  She monitors sugar levels frequently throughout daytime hours. - Vaccines up to date - Foot exam up to date.  Eye exam pending. - ARB and statin on board.

## 2023-09-16 NOTE — Assessment & Plan Note (Signed)
Chronic, ongoing with most recent 08/11/23 was 25-30%, a decline from previous EF 45-50% (10/22/22), followed by cardiology and HF Clinic.  Euvolemic today.  Continue current medication regimen and collaboration with cardiology.  Recommend: - Reminded to call for an overnight weight gain of >2 pounds or a weekly weight gain of >5 pounds - not adding salt to food and read food labels. Reviewed the importance of keeping daily sodium intake to 2000mg  daily - Avoid Ibuprofen products

## 2023-09-16 NOTE — Assessment & Plan Note (Addendum)
Chronic, ongoing.  Denies SI/HI.  Mood well-controlled at this time.  Continue current medication regimen and adjust as needed.

## 2023-09-16 NOTE — Patient Outreach (Signed)
  Care Coordination   Follow Up Visit Note   09/16/2023 Name: GENEVA BARRERO MRN: [id number] DOB: 03-12-1951  [name] is a 73 y.o. year old female who sees Aura Dials T, NP for primary care. I spoke with  [name] by phone today.  What matters to the patients health and wellness today?  Patient report she is better "day by day." State she is working to get back to her baseline, which she was driving prior to recent hospital admission. Denies any urgent concerns, encouraged to contact this care manager with questions.      Goals Addressed             This Visit's Progress    Management of chronic medical conditions   On track    Interventions Today    Flowsheet Row Most Recent Value  Chronic Disease   Chronic disease during today's visit Chronic Obstructive Pulmonary Disease (COPD), Hypertension (HTN), Congestive Heart Failure (CHF), Diabetes  General Interventions   General Interventions Discussed/Reviewed General Interventions Reviewed, Doctor Visits, Durable Medical Equipment (DME), Community Resources  [place] active with HHPT/RN, encouraged to continue independent exercises that are provided by PT to further promote strength]  Doctor Visits Discussed/Reviewed Doctor Visits Reviewed, PCP, Specialist  [Reviewed upcoming: HF clinic 2/20, Pulmonary 3/5, Pain clinic and GI on 3/6]  Durable Medical Equipment (DME) Oxygen, Other  [confirmed patient has working scale]  PCP/Specialist Visits Compliance with follow-up visit  Exercise Interventions   Exercise Discussed/Reviewed Weight Managment  Weight Management Weight maintenance  [Advised to continue daily weights, notify MD if weight increases greater than 2 pounds overnight or 5 pounds in a week]  Education Interventions   Education Provided Provided Education  Provided Verbal Education On Medication, When to see the doctor, Nutrition  [Meds reviewed, no recent changes. Continue oxygen therapy, monitor  saturation levels. Report blood sugar levels are stable, has husband in the home helping]  Nutrition Interventions   Nutrition Discussed/Reviewed Nutrition Reviewed, Decreasing fats, Adding fruits and vegetables, Decreasing salt, Decreasing sugar intake                SDOH assessments and interventions completed:  No     Care Coordination Interventions:  Yes, provided   Follow up plan: Follow up call scheduled for 3/14    Encounter Outcome:  Patient Visit Completed   Rodney Langton, RN, MSN, CCM [place]  [facility], [facility] RN Care Coordinator Direct Dial: [phone] / Main [phone] Fax [phone] Email: [email or web address]: [email or web address]

## 2023-09-16 NOTE — Assessment & Plan Note (Signed)
Chronic, ongoing, insulin dependent. Chronic, ongoing with last A1c 7.4% in November, will recheck this today + check urine ALB.  Adjust regimen as needed.  Did not tolerate SGLT2 in past + Mounjaro made her sick.  She monitors sugar levels frequently throughout daytime hours. - Will continue Metformin at max dosing, Trulicity 4.5 MG weekly, and Tresiba 50 units, increase 3 units in 3 days if fasting sugar not less then 130 consistently recommended.  Continue meal time insulin, 2-5 units prior to meals and will increase as needed.  Educated her on this.   - Freestyle use continues. - Recommend she continue to monitor BS consistently at home and document + focus heavily on diet changes.  She is aware to notify provider if fasting BS >130 consistently or <70, as may need to adjust insulin further.   - In past poorly tolerated Jardiance and Mounjaro -- discussed with her that and how SGLT2 would benefit HF -- however concern for side effects.   - Foot exam up to date, needs eye exam. - Vaccinations up to date.

## 2023-09-16 NOTE — Assessment & Plan Note (Signed)
Chronic, stable.  Continue current medication regimen and adjust as needed.  Lipid panel today.

## 2023-09-16 NOTE — Progress Notes (Signed)
BP 114/67   Pulse 90   Temp 98.7 F (37.1 C) (Oral)   Ht 4' 10.2" (1.478 m)   Wt 139 lb 6.4 oz (63.2 kg)   LMP  (LMP Unknown)   SpO2 97%   BMI 28.93 kg/m    Subjective:    [name], female    DOB: [birth year], 73 y.o.   MRN: [id number]  HPI: Jordan Chan is a 73 y.o. female  Chief Complaint  Patient presents with   Atrial Fibrillation   COPD   Depression   Diabetes   Hyperlipidemia   Hypertension   DIABETES A1c 7.4% in November. Continues to take Metformin 1000 MG BID, Trulicity 4.5 MG weekly, Tresiba 50 units at HS, and meal time insulin (sometimes will forget this). Jardiance made her sick in past and Mounjaro gave her severe diarrhea and abdominal pain.  Works with PharmD as needed for assist.   Hypoglycemic episodes:yes x 1 that was 69 Polydipsia/polyuria: no Visual disturbance: no Chest pain: no Paresthesias: no Glucose Monitoring: yes  Accucheck frequency: BID  Fasting glucose: averages 90 to 150 range  Post prandial: 180 - 220 range  Evening:  Before meals: Taking Insulin?: yes  Long acting insulin: 50 units  Chan acting insulin: 5 units Blood Pressure Monitoring: monthly Retinal Examination: Not up to Date -- March 2025 is scheduled Foot Exam: Up to Date Pneumovax: Up to Date Influenza: Up to Date Aspirin: no   HYPERTENSION / HYPERLIPIDEMIA Continues Atorvastatin 40 MG, Metoprolol 12.5 MG daily, Lasix 40 MG daily, Losartan 12.5 MG daily, + ASA.  Continues on Eliquis. Last saw cardiology 06/25/23.   Recent echo on 08/11/23 showed EF 25-30%, left ventricle with severe decreased function. Satisfied with current treatment? yes Duration of hypertension: chronic BP monitoring frequency: a few times a week BP range: 110/70 range BP medication side effects: no:  Duration of hyperlipidemia: chronic Cholesterol medication side effects: no Cholesterol supplements: none Medication compliance: good compliance Aspirin: no Recent  stressors: no Recurrent headaches: no Visual changes: no Palpitations: no Dyspnea: no Chest pain: no Lower extremity edema: no Dizzy/lightheaded: no   COPD Continues Trelegy, Albuterol, and Singulair.  Last saw pulmonary 06/09/23.  Recovering from recent PNA.   Has a history of smoking, quit 15 years ago. She does not use CPAP, but continues to use O2 at night 2 L == sleeps in recliner at baseline, has since 1998. COPD status: stable Satisfied with current treatment?: yes Oxygen use: yes 2L Dyspnea frequency: no Cough frequency: no -- none at all Rescue inhaler frequency: rarely Limitation of activity: no Productive cough: no Last Spirometry: with pulmonary Pneumovax: Up to Date Influenza: Up to Date   ATRIAL FIBRILLATION Atrial fibrillation status: stable Satisfied with current treatment: yes  Medication side effects:  no Medication compliance: good compliance Etiology of atrial fibrillation: unknown Palpitations:  no Chest pain:  no Dyspnea on exertion:  no Orthopnea:  no Syncope:  no Edema:  no Ventricular rate control: B-blocker Anti-coagulation: long acting   CHRONIC KIDNEY DISEASE Improved on recent check. CKD status: stable Medications renally dose: yes Previous renal evaluation: no Pneumovax:  Up to Date Influenza Vaccine:  Up to Date   DEPRESSION Taking Effexor 150 MG daily + Buspar 5 MG BID.  Exacerbated by recent hospitalization and being on vent for period.  Follows with local pain clinic, they adjust her pain pump. Mood status: stable Satisfied with current treatment?: yes Symptom severity: moderate  Duration of current treatment :  chronic Side effects: no Medication compliance: good compliance Psychotherapy/counseling: yes in the past Depressed mood: yes Anxious mood: yes Anhedonia: no Significant weight loss or gain: no Insomnia: yes -- due to coughing from ex-husband Fatigue: no Feelings of worthlessness or guilt: no Impaired  concentration/indecisiveness: no Suicidal ideations: no Hopelessness: no Crying spells: yes    09/16/2023    8:11 AM 09/04/2023    1:08 PM 08/04/2023   12:50 PM 07/27/2023    8:09 AM 06/17/2023    9:58 AM  Depression screen PHQ 2/9  Decreased Interest 0 0 0 0 0  Down, Depressed, Hopeless 1 0 0 2 1  PHQ - 2 Score 1 0 0 2 1  Altered sleeping 3 2  2 1   Tired, decreased energy 3 3  2 3   Change in appetite 1 2  1  0  Feeling bad or failure about yourself  0 0  0 0  Trouble concentrating 0 0  0 0  Moving slowly or fidgety/restless 0 0  0 0  Suicidal thoughts 0 0  0 0  PHQ-9 Score 8 7  7 5   Difficult doing work/chores Not difficult at all Not difficult at all  Not difficult at all Not difficult at all       09/16/2023    8:11 AM 09/04/2023    1:08 PM 07/27/2023    8:09 AM 06/12/2023    9:23 AM  GAD 7 : Generalized Anxiety Score  Nervous, Anxious, on Edge 1 1 1  0  Control/stop worrying 0 1 0 0  Worry too much - different things 1 0 0 0  Trouble relaxing 1 1 1  0  Restless 0 0 0 0  Easily annoyed or irritable 0 0 0 0  Afraid - awful might happen 0 0 0 0  Total GAD 7 Score 3 3 2  0  Anxiety Difficulty Not difficult at all Not difficult at all Not difficult at all Not difficult at all   Relevant past medical, surgical, family and social history reviewed and updated as indicated. Interim medical history since our last visit reviewed. Allergies and medications reviewed and updated.  Review of Systems  Constitutional:  Negative for activity change, appetite change, diaphoresis, fatigue and fever.  Respiratory:  Negative for cough, chest tightness and shortness of breath.   Cardiovascular:  Negative for chest pain, palpitations and leg swelling.  Gastrointestinal: Negative.   Endocrine: Negative for heat intolerance, polydipsia, polyphagia and polyuria.  Neurological: Negative.   Psychiatric/Behavioral: Negative.     Per HPI unless specifically indicated above     Objective:    BP  114/67   Pulse 90   Temp 98.7 F (37.1 C) (Oral)   Ht 4' 10.2" (1.478 m)   Wt 139 lb 6.4 oz (63.2 kg)   LMP  (LMP Unknown)   SpO2 97%   BMI 28.93 kg/m   Wt Readings from Last 3 Encounters:  09/16/23 139 lb 6.4 oz (63.2 kg)  09/04/23 136 lb 9.6 oz (62 kg)  08/28/23 135 lb 6 oz (61.4 kg)    Physical Exam Vitals and nursing note reviewed.  Constitutional:      General: She is awake. She is not in acute distress.    Appearance: Normal appearance. She is well-developed and well-groomed. She is not ill-appearing or toxic-appearing.  HENT:     Head: Normocephalic.     Right Ear: Hearing and external ear normal.     Left Ear: Hearing and external ear normal.  Eyes:  General: Lids are normal.        Right eye: No discharge.        Left eye: No discharge.     Conjunctiva/sclera: Conjunctivae normal.     Pupils: Pupils are equal, round, and reactive to light.  Neck:     Thyroid: No thyromegaly.     Vascular: No carotid bruit.     Comments: Kyphosis present back. Cardiovascular:     Rate and Rhythm: Normal rate and regular rhythm.     Heart sounds: Normal heart sounds. No murmur heard.    No gallop.  Pulmonary:     Effort: Pulmonary effort is normal. No accessory muscle usage or respiratory distress.     Breath sounds: Normal breath sounds.  Abdominal:     General: Bowel sounds are normal. There is no distension.     Palpations: Abdomen is soft.     Tenderness: There is no abdominal tenderness.  Musculoskeletal:     Cervical back: Normal range of motion and neck supple. Torticollis present.     Right lower leg: No edema.     Left lower leg: No edema.  Lymphadenopathy:     Cervical: No cervical adenopathy.  Skin:    General: Skin is warm and dry.  Neurological:     Mental Status: She is alert and oriented to person, place, and time.     Deep Tendon Reflexes: Reflexes are normal and symmetric.     Reflex Scores:      Brachioradialis reflexes are 2+ on the right side and  2+ on the left side.      Patellar reflexes are 2+ on the right side and 2+ on the left side. Psychiatric:        Attention and Perception: Attention normal.        Mood and Affect: Mood normal.        Speech: Speech normal.        Behavior: Behavior normal. Behavior is cooperative.        Thought Content: Thought content normal.    Results for orders placed or performed in visit on 09/04/23  Ferritin   Collection Time: 09/04/23  1:48 PM  Result Value Ref Range   Ferritin 34 15 - 150 ng/mL  Iron   Collection Time: 09/04/23  1:48 PM  Result Value Ref Range   Iron 76 27 - 139 ug/dL  CBC with Differential/Platelet   Collection Time: 09/04/23  1:48 PM  Result Value Ref Range   WBC 11.2 (H) 3.4 - 10.8 x10E3/uL   RBC 3.55 (L) 3.77 - 5.28 x10E6/uL   Hemoglobin 9.9 (L) 11.1 - 15.9 g/dL   Hematocrit 16.1 (L) 09.6 - 46.6 %   MCV 90 79 - 97 fL   MCH 27.9 26.6 - 33.0 pg   MCHC 30.9 (L) 31.5 - 35.7 g/dL   RDW 04.5 40.9 - 81.1 %   Platelets 465 (H) 150 - 450 x10E3/uL   Neutrophils 60 Not Estab. %   Lymphs 28 Not Estab. %   Monocytes 8 Not Estab. %   Eos 3 Not Estab. %   Basos 1 Not Estab. %   Neutrophils Absolute 6.8 1.4 - 7.0 x10E3/uL   Lymphocytes Absolute 3.1 0.7 - 3.1 x10E3/uL   Monocytes Absolute 0.9 0.1 - 0.9 x10E3/uL   EOS (ABSOLUTE) 0.3 0.0 - 0.4 x10E3/uL   Basophils Absolute 0.1 0.0 - 0.2 x10E3/uL   Immature Granulocytes 0 Not Estab. %   Immature Grans (Abs) 0.0  0.0 - 0.1 x10E3/uL  Comprehensive metabolic panel   Collection Time: 09/04/23  1:48 PM  Result Value Ref Range   Glucose 151 (H) 70 - 99 mg/dL   BUN 19 8 - 27 mg/dL   Creatinine, Ser 4.09 (H) 0.57 - 1.00 mg/dL   eGFR 44 (L) >81 XB/JYN/8.29   BUN/Creatinine Ratio 15 12 - 28   Sodium 139 134 - 144 mmol/L   Potassium 3.9 3.5 - 5.2 mmol/L   Chloride 95 (L) 96 - 106 mmol/L   CO2 25 20 - 29 mmol/L   Calcium 9.5 8.7 - 10.3 mg/dL   Total Protein 7.9 6.0 - 8.5 g/dL   Albumin 4.3 3.8 - 4.8 g/dL   Globulin, Total 3.6  1.5 - 4.5 g/dL   Bilirubin Total 0.2 0.0 - 1.2 mg/dL   Alkaline Phosphatase 95 44 - 121 IU/L   AST 16 0 - 40 IU/L   ALT 11 0 - 32 IU/L      Assessment & Plan:   Problem List Items Addressed This Visit       Cardiovascular and Mediastinum   Chronic HFrEF (heart failure with reduced ejection fraction) (HCC) (Chronic)   Chronic, ongoing with most recent 08/11/23 was 25-30%, a decline from previous EF 45-50% (10/22/22), followed by cardiology and HF Clinic.  Euvolemic today.  Continue current medication regimen and collaboration with cardiology.  Recommend: - Reminded to call for an overnight weight gain of >2 pounds or a weekly weight gain of >5 pounds - not adding salt to food and read food labels. Reviewed the importance of keeping daily sodium intake to 2000mg  daily - Avoid Ibuprofen products      Aortic atherosclerosis (HCC)   Ongoing.  Noted on CT 12/11/19.  Continue daily statin.  Monitor closely for symptoms.      Hypertension associated with diabetes (HCC)   Chronic, stable.  BP well below goal in office today.  Recommend she continue to monitor BP at home regularly.  Focus on DASH diet.  Continue current medication regimen and adjust as needed, refills sent as needed.  LABS: CBC, CMP.  Return in 3 months.       Relevant Orders   Bayer DCA Hb A1c Waived   Microalbumin, Urine Waived   CBC with Differential/Platelet   Comprehensive metabolic panel   Persistent atrial fibrillation (HCC)   Chronic, stable.  Rate well controlled.  Continue current medications and collaboration with cardiology.        Respiratory   COPD, moderate (HCC) (Chronic)   Chronic, stable. Rare use of Albuterol. Continue Trelegy which offers benefit of medication minimization and has benefited symptoms.  Continue to collaborate with Dr. Meredeth Ide, recent notes reviewed.  Continue O2 use as ordered.        Endocrine   Diabetic peripheral neuropathy (HCC) (Chronic)   Chronic, ongoing with last A1c 7.4%  in November, will recheck this today + check urine ALB.  Adjust regimen as needed.  Did not tolerate SGLT2 in past + Mounjaro made her sick.  She monitors sugar levels frequently throughout daytime hours. - Vaccines up to date - Foot exam up to date.  Eye exam pending. - ARB and statin on board.      Relevant Orders   Bayer DCA Hb A1c Waived   Microalbumin, Urine Waived   Comprehensive metabolic panel   Hyperlipidemia associated with type 2 diabetes mellitus (HCC)   Chronic, stable.  Continue current medication regimen and adjust as needed.  Lipid  panel today.      Relevant Orders   Bayer DCA Hb A1c Waived   Comprehensive metabolic panel   Lipid Panel w/o Chol/HDL Ratio   Insulin dependent type 2 diabetes mellitus (HCC) - Primary   Chronic, ongoing, insulin dependent. Chronic, ongoing with last A1c 7.4% in November, will recheck this today + check urine ALB.  Adjust regimen as needed.  Did not tolerate SGLT2 in past + Mounjaro made her sick.  She monitors sugar levels frequently throughout daytime hours. - Will continue Metformin at max dosing, Trulicity 4.5 MG weekly, and Tresiba 50 units, increase 3 units in 3 days if fasting sugar not less then 130 consistently recommended.  Continue meal time insulin, 2-5 units prior to meals and will increase as needed.  Educated her on this.   - Freestyle use continues. - Recommend she continue to monitor BS consistently at home and document + focus heavily on diet changes.  She is aware to notify provider if fasting BS >130 consistently or <70, as may need to adjust insulin further.   - In past poorly tolerated Jardiance and Mounjaro -- discussed with her that and how SGLT2 would benefit HF -- however concern for side effects.   - Foot exam up to date, needs eye exam. - Vaccinations up to date.        Relevant Orders   Bayer DCA Hb A1c Waived   Microalbumin, Urine Waived   Comprehensive metabolic panel     Genitourinary   CKD stage 3a, GFR  45-59 ml/min (HCC) (Chronic)   Ongoing and stable recent labs.  Continue Losartan at low dose for heart and kidney protection.  Labs CBC and CMP.  Will refer to nephrology if any worsening in future.  May need to reduce Metformin if eGFR <45.        Other   Chronic pain syndrome (Chronic)   Chronic.  Continue to collaborate with chronic pain management locally, recent notes reviewed.       Depression, major, single episode, in partial remission (HCC)   Chronic, ongoing.  Denies SI/HI.  Mood well-controlled at this time.  Continue current medication regimen and adjust as needed.        Vitamin D deficiency   Chronic, stable.  Recheck Vitamin D next visit and adjust supplement as needed.      Relevant Orders   VITAMIN D 25 Hydroxy (Vit-D Deficiency, Fractures)      Follow up plan: Return in about 3 months (around 12/14/2023) for T2DM, HTN/HLD, COPD, Depression, CKD.

## 2023-09-16 NOTE — Assessment & Plan Note (Signed)
Chronic, stable.  Rate well controlled.  Continue current medications and collaboration with cardiology.

## 2023-09-16 NOTE — Assessment & Plan Note (Signed)
Chronic, stable.  Recheck Vitamin D next visit and adjust supplement as needed.

## 2023-09-16 NOTE — Patient Instructions (Signed)
Visit Information  Thank you for taking time to visit with me today. Please don't hesitate to contact me if I can be of assistance to you before our next scheduled telephone appointment.  Following are the goals we discussed today:  Wear oxygen as indicated, monitor saturation levels.  Monitor weights daily, notify MD if gain of greater than 2 pounds overnight or 5 pounds in a week.  Continue working with the home health team.   Our next appointment is by telephone on 3/14  Please call the care guide team at [phone] if you need to cancel or reschedule your appointment.   Please call the Suicide and Crisis Lifeline: 988 call the [place] National Suicide Prevention Lifeline: 831-848-1128 or TTY: 732-007-4860 TTY 9124712970) to talk to a trained counselor call 1-800-273-TALK (toll free, 24 hour hotline) call 911 if you are experiencing a Mental Health or Behavioral Health Crisis or need someone to talk to.  Patient verbalizes understanding of instructions and care plan provided today and agrees to view in MyChart. Active MyChart status and patient understanding of how to access instructions and care plan via MyChart confirmed with patient.     The patient has been provided with contact information for the care management team and has been advised to call with any health related questions or concerns.   Rodney Langton, RN, MSN, CCM [facility] RN Care Coordinator Direct Dial: [phone] / Main [phone] Fax [phone] Email: [email or web address]: [email or web address]

## 2023-09-16 NOTE — Assessment & Plan Note (Signed)
Chronic, stable.  BP well below goal in office today.  Recommend she continue to monitor BP at home regularly.  Focus on DASH diet.  Continue current medication regimen and adjust as needed, refills sent as needed.  LABS: CBC, CMP.  Return in 3 months.

## 2023-09-17 ENCOUNTER — Encounter: Payer: Self-pay | Admitting: Nurse Practitioner

## 2023-09-17 DIAGNOSIS — I4819 Other persistent atrial fibrillation: Secondary | ICD-10-CM | POA: Diagnosis not present

## 2023-09-17 DIAGNOSIS — J441 Chronic obstructive pulmonary disease with (acute) exacerbation: Secondary | ICD-10-CM | POA: Diagnosis not present

## 2023-09-17 DIAGNOSIS — I5043 Acute on chronic combined systolic (congestive) and diastolic (congestive) heart failure: Secondary | ICD-10-CM | POA: Diagnosis not present

## 2023-09-17 DIAGNOSIS — J440 Chronic obstructive pulmonary disease with acute lower respiratory infection: Secondary | ICD-10-CM | POA: Diagnosis not present

## 2023-09-17 DIAGNOSIS — J188 Other pneumonia, unspecified organism: Secondary | ICD-10-CM | POA: Diagnosis not present

## 2023-09-17 DIAGNOSIS — A419 Sepsis, unspecified organism: Secondary | ICD-10-CM | POA: Diagnosis not present

## 2023-09-17 DIAGNOSIS — J9622 Acute and chronic respiratory failure with hypercapnia: Secondary | ICD-10-CM | POA: Diagnosis not present

## 2023-09-17 DIAGNOSIS — I130 Hypertensive heart and chronic kidney disease with heart failure and stage 1 through stage 4 chronic kidney disease, or unspecified chronic kidney disease: Secondary | ICD-10-CM | POA: Diagnosis not present

## 2023-09-17 DIAGNOSIS — J9621 Acute and chronic respiratory failure with hypoxia: Secondary | ICD-10-CM | POA: Diagnosis not present

## 2023-09-17 LAB — COMPREHENSIVE METABOLIC PANEL
ALT: 10 [IU]/L (ref 0–32)
AST: 16 [IU]/L (ref 0–40)
Albumin: 4.1 g/dL (ref 3.8–4.8)
Alkaline Phosphatase: 81 [IU]/L (ref 44–121)
BUN/Creatinine Ratio: 13 (ref 12–28)
BUN: 16 mg/dL (ref 8–27)
Bilirubin Total: 0.2 mg/dL (ref 0.0–1.2)
CO2: 29 mmol/L (ref 20–29)
Calcium: 9.4 mg/dL (ref 8.7–10.3)
Chloride: 95 mmol/L — ABNORMAL LOW (ref 96–106)
Creatinine, Ser: 1.25 mg/dL — ABNORMAL HIGH (ref 0.57–1.00)
Globulin, Total: 3.2 g/dL (ref 1.5–4.5)
Glucose: 136 mg/dL — ABNORMAL HIGH (ref 70–99)
Potassium: 4.3 mmol/L (ref 3.5–5.2)
Sodium: 139 mmol/L (ref 134–144)
Total Protein: 7.3 g/dL (ref 6.0–8.5)
eGFR: 46 mL/min/{1.73_m2} — ABNORMAL LOW (ref >59)

## 2023-09-17 LAB — LIPID PANEL W/O CHOL/HDL RATIO
Cholesterol, Total: 123 mg/dL (ref 100–199)
HDL: 60 mg/dL (ref >39)
LDL Chol Calc (NIH): 42 mg/dL (ref 0–99)
Triglycerides: 121 mg/dL (ref 0–149)
VLDL Cholesterol Cal: 21 mg/dL (ref 5–40)

## 2023-09-17 LAB — CBC WITH DIFFERENTIAL/PLATELET
Basophils Absolute: 0.1 10*3/uL (ref 0.0–0.2)
Basos: 1 %
EOS (ABSOLUTE): 0.3 10*3/uL (ref 0.0–0.4)
Eos: 3 %
Hematocrit: 34.9 % (ref 34.0–46.6)
Hemoglobin: 10.7 g/dL — ABNORMAL LOW (ref 11.1–15.9)
Immature Grans (Abs): 0 10*3/uL (ref 0.0–0.1)
Immature Granulocytes: 0 %
Lymphocytes Absolute: 2.7 10*3/uL (ref 0.7–3.1)
Lymphs: 31 %
MCH: 27.9 pg (ref 26.6–33.0)
MCHC: 30.7 g/dL — ABNORMAL LOW (ref 31.5–35.7)
MCV: 91 fL (ref 79–97)
Monocytes Absolute: 0.8 10*3/uL (ref 0.1–0.9)
Monocytes: 9 %
Neutrophils Absolute: 4.9 10*3/uL (ref 1.4–7.0)
Neutrophils: 56 %
Platelets: 393 10*3/uL (ref 150–450)
RBC: 3.83 x10E6/uL (ref 3.77–5.28)
RDW: 14.1 % (ref 11.7–15.4)
WBC: 8.9 10*3/uL (ref 3.4–10.8)

## 2023-09-17 LAB — VITAMIN D 25 HYDROXY (VIT D DEFICIENCY, FRACTURES): Vit D, 25-Hydroxy: 56.8 ng/mL (ref 30.0–100.0)

## 2023-09-17 NOTE — Progress Notes (Signed)
Contacted via MyChart   Good afternoon Jordan Chan, your labs have returned: - A1c is 6.4%.  WOW!!  Keep up the good work, but if you start getting a lot of blood sugars in the 70 or less range then cut back on Tresiba to 52 units. - CBC shows hemoglobin and hematocrit trending up and improving.  Good news. Plus white blood cell count has come down. - Kidney function, creatinine and eGFR, shows ongoing Stage 3a kidney disease. Liver function is normal. - Remainder of labs stable.  Any questions? Keep being amazing!!  Thank you for allowing me to participate in your care.  I appreciate you. Kindest regards, Lida Berkery

## 2023-09-18 DIAGNOSIS — J440 Chronic obstructive pulmonary disease with acute lower respiratory infection: Secondary | ICD-10-CM | POA: Diagnosis not present

## 2023-09-18 DIAGNOSIS — I4819 Other persistent atrial fibrillation: Secondary | ICD-10-CM | POA: Diagnosis not present

## 2023-09-18 DIAGNOSIS — J188 Other pneumonia, unspecified organism: Secondary | ICD-10-CM | POA: Diagnosis not present

## 2023-09-18 DIAGNOSIS — I130 Hypertensive heart and chronic kidney disease with heart failure and stage 1 through stage 4 chronic kidney disease, or unspecified chronic kidney disease: Secondary | ICD-10-CM | POA: Diagnosis not present

## 2023-09-18 DIAGNOSIS — J9621 Acute and chronic respiratory failure with hypoxia: Secondary | ICD-10-CM | POA: Diagnosis not present

## 2023-09-18 DIAGNOSIS — A419 Sepsis, unspecified organism: Secondary | ICD-10-CM | POA: Diagnosis not present

## 2023-09-18 DIAGNOSIS — J441 Chronic obstructive pulmonary disease with (acute) exacerbation: Secondary | ICD-10-CM | POA: Diagnosis not present

## 2023-09-18 DIAGNOSIS — J9622 Acute and chronic respiratory failure with hypercapnia: Secondary | ICD-10-CM | POA: Diagnosis not present

## 2023-09-18 DIAGNOSIS — I5043 Acute on chronic combined systolic (congestive) and diastolic (congestive) heart failure: Secondary | ICD-10-CM | POA: Diagnosis not present

## 2023-09-20 ENCOUNTER — Other Ambulatory Visit: Payer: Self-pay | Admitting: Nurse Practitioner

## 2023-09-21 DIAGNOSIS — J441 Chronic obstructive pulmonary disease with (acute) exacerbation: Secondary | ICD-10-CM | POA: Diagnosis not present

## 2023-09-21 DIAGNOSIS — J9621 Acute and chronic respiratory failure with hypoxia: Secondary | ICD-10-CM | POA: Diagnosis not present

## 2023-09-21 DIAGNOSIS — J188 Other pneumonia, unspecified organism: Secondary | ICD-10-CM | POA: Diagnosis not present

## 2023-09-21 DIAGNOSIS — J440 Chronic obstructive pulmonary disease with acute lower respiratory infection: Secondary | ICD-10-CM | POA: Diagnosis not present

## 2023-09-21 DIAGNOSIS — I4819 Other persistent atrial fibrillation: Secondary | ICD-10-CM | POA: Diagnosis not present

## 2023-09-21 DIAGNOSIS — J9622 Acute and chronic respiratory failure with hypercapnia: Secondary | ICD-10-CM | POA: Diagnosis not present

## 2023-09-21 DIAGNOSIS — I5043 Acute on chronic combined systolic (congestive) and diastolic (congestive) heart failure: Secondary | ICD-10-CM | POA: Diagnosis not present

## 2023-09-21 DIAGNOSIS — I130 Hypertensive heart and chronic kidney disease with heart failure and stage 1 through stage 4 chronic kidney disease, or unspecified chronic kidney disease: Secondary | ICD-10-CM | POA: Diagnosis not present

## 2023-09-21 DIAGNOSIS — A419 Sepsis, unspecified organism: Secondary | ICD-10-CM | POA: Diagnosis not present

## 2023-09-21 NOTE — Telephone Encounter (Signed)
Requested medications are due for refill today.  unsure  Requested medications are on the active medications list.  yes  Last refill. 02/13/2023 #180 2 rf  Future visit scheduled.   yes  Notes to clinic.  Medication not assigned to a protocol. Please review for refill.    Requested Prescriptions  Pending Prescriptions Disp Refills   TRELEGY ELLIPTA 100-62.5-25 MCG/ACT AEPB [Pharmacy Med Name: Trelegy Ellipta Inhalation Aerosol Powder Breath Activated 100-62.5-25 MCG/ACT] 180 each 3    Sig: INHALE 1 PUFF INTO THE LUNGS DAILY . 42 DAY EXPIRATION AFTER OPENING FOIL TRAY     Off-Protocol Failed - 09/21/2023  5:26 PM      Failed - Medication not assigned to a protocol, review manually.      Passed - Valid encounter within last 12 months    Recent Outpatient Visits           2 weeks ago Insulin dependent type 2 diabetes mellitus (HCC)   [place] [facility] [place], [place] T, NP   1 month ago Chronic HFrEF (heart failure with reduced ejection fraction) (HCC)   [place] [facility] [place], Corrie Dandy T, NP   1 month ago SOB (shortness of breath) on exertion   [facility] [place], [place] T, NP   2 months ago SOB (shortness of breath)   [facility], Megan P, DO   3 months ago Insulin dependent type 2 diabetes mellitus (HCC)   [facility] [place], Dorie Rank, NP       Future Appointments             In 2 months [name], Dorie Rank, NP  [facility]

## 2023-09-22 DIAGNOSIS — J188 Other pneumonia, unspecified organism: Secondary | ICD-10-CM | POA: Diagnosis not present

## 2023-09-22 DIAGNOSIS — I5043 Acute on chronic combined systolic (congestive) and diastolic (congestive) heart failure: Secondary | ICD-10-CM | POA: Diagnosis not present

## 2023-09-22 DIAGNOSIS — J9621 Acute and chronic respiratory failure with hypoxia: Secondary | ICD-10-CM | POA: Diagnosis not present

## 2023-09-22 DIAGNOSIS — I130 Hypertensive heart and chronic kidney disease with heart failure and stage 1 through stage 4 chronic kidney disease, or unspecified chronic kidney disease: Secondary | ICD-10-CM | POA: Diagnosis not present

## 2023-09-22 DIAGNOSIS — J9622 Acute and chronic respiratory failure with hypercapnia: Secondary | ICD-10-CM | POA: Diagnosis not present

## 2023-09-22 DIAGNOSIS — A419 Sepsis, unspecified organism: Secondary | ICD-10-CM | POA: Diagnosis not present

## 2023-09-22 DIAGNOSIS — I4819 Other persistent atrial fibrillation: Secondary | ICD-10-CM | POA: Diagnosis not present

## 2023-09-22 DIAGNOSIS — J441 Chronic obstructive pulmonary disease with (acute) exacerbation: Secondary | ICD-10-CM | POA: Diagnosis not present

## 2023-09-22 DIAGNOSIS — J440 Chronic obstructive pulmonary disease with acute lower respiratory infection: Secondary | ICD-10-CM | POA: Diagnosis not present

## 2023-09-23 DIAGNOSIS — J440 Chronic obstructive pulmonary disease with acute lower respiratory infection: Secondary | ICD-10-CM | POA: Diagnosis not present

## 2023-09-23 DIAGNOSIS — I130 Hypertensive heart and chronic kidney disease with heart failure and stage 1 through stage 4 chronic kidney disease, or unspecified chronic kidney disease: Secondary | ICD-10-CM | POA: Diagnosis not present

## 2023-09-23 DIAGNOSIS — A419 Sepsis, unspecified organism: Secondary | ICD-10-CM | POA: Diagnosis not present

## 2023-09-23 DIAGNOSIS — J188 Other pneumonia, unspecified organism: Secondary | ICD-10-CM | POA: Diagnosis not present

## 2023-09-23 DIAGNOSIS — J441 Chronic obstructive pulmonary disease with (acute) exacerbation: Secondary | ICD-10-CM | POA: Diagnosis not present

## 2023-09-23 DIAGNOSIS — I5043 Acute on chronic combined systolic (congestive) and diastolic (congestive) heart failure: Secondary | ICD-10-CM | POA: Diagnosis not present

## 2023-09-23 DIAGNOSIS — I4819 Other persistent atrial fibrillation: Secondary | ICD-10-CM | POA: Diagnosis not present

## 2023-09-23 DIAGNOSIS — J9622 Acute and chronic respiratory failure with hypercapnia: Secondary | ICD-10-CM | POA: Diagnosis not present

## 2023-09-23 DIAGNOSIS — J9621 Acute and chronic respiratory failure with hypoxia: Secondary | ICD-10-CM | POA: Diagnosis not present

## 2023-09-23 NOTE — Progress Notes (Deleted)
 Advanced Heart Failure Clinic Note     PCP: Marjie Skiff, NP (last seen 02/25) Cardiologist: Yvonne Kendall, MD (last seen 11/24)  Chief Complaint:   HPI:  Ms Arutyunyan is a 73 y/o female with a history of B12 deficiency, chronic pain syndrome s/p intrathecal pump, paroxysmal atrial fibrillation, stress-induced cardiomyopathy in the setting of pain pump malfunction and opioid withdrawal (2018), HTN, hyperlipidemia, PE (2021), DM, COPD, GERD, OSA, breast cancer, chronic back pain,anemia and IBS.   Admitted 04/15/23 due to worsening of left hip pain at the surgical site associated with increasing redness about the left hip surgical wound. Tachycardic. WBC 17.6. CXR normal.Thought to be septic due to left hip surgical wound infection. Treated with IV fluids, analgesics and empiric IV antibiotics. Transferred to [facility].    Admitted 08/09/23 due to acute respiratory distress found with sats in the 50 to 60% on RA. Found to have multiple PE's and sepsis due to aspiration pneumonia. Initially intubated. Completed antibiotics & successfully extubated. Underwent swallowing evaluation. Heparin stopped and transitioned to eliquis. Anemia stable.   Echo 12/11/20: EF 45% with Grade II DD Echo 09/11/21: EF 25-30% with Grade II DD Echo 10/31/21: EF 40-45% with Grade I DD Echo 08/11/23: EF 25-30% with Grade I DD  She presents today, with her husband and SIL, for a HF follow-up visit with a chief complaint of          Wearing oxygen at 2L around the clock. Quit smoking 19 years ago when a grandchild was going to be born. Denies alcohol use. Not adding salt to her food. Weighing daily    Previous cardiac studies:  [facility] 04/28/17:  No angiographically significant coronary artery disease. Severely reduced LV contraction (LVEF 25-30%) with mid and apical akinesis, consistent with Takotsubo (stress-induced) cardiomyopathy. Mildly elevated left ventricular filling pressure.  ROS:  All systems negative except what is listed in HPI, PMH and Problem List  Past Medical History:  Diagnosis Date   Arthritis    Asthma    Atrial fibrillation (HCC)    Breast cancer (HCC) 1998   right breast ca with mastectomy and chemotherapy and radiation   Bronchitis    CHF (congestive heart failure) (HCC)    "with Morphine withdrawal"   COPD (chronic obstructive pulmonary disease) (HCC)    Diabetes mellitus without complication (HCC)    Diverticulitis    diverticulosis also   Dyspnea    Endometriosis    GERD (gastroesophageal reflux disease)    History of shingles 2000-2005   Hypercholesteremia    Hypertension    IBS (irritable bowel syndrome)    Low back pain    a. Implanted morphine/bupivicaine/clonidine pump.   Neuropathy    Orthopnea    Oxygen dependent    uses at night   Personal history of chemotherapy    Personal history of radiation therapy    Pneumonia    pneumonia 5-6 times, history of bronchitis also   Scoliosis    Sleep apnea    does not use cpap   Stroke (HCC) 2010   TIA, 10 years ago   Withdrawal from sedative drug (HCC)    withdrawal from morphine when pump batteries died    Current Outpatient Medications  Medication Sig Dispense Refill   ACCU-CHEK AVIVA PLUS test strip TEST THREE TIMES DAILY 300 strip 3   Accu-Chek Softclix Lancets lancets TEST BLOOD SUGAR THREE TIMES DAILY 300 each 3   albuterol (PROVENTIL) (2.5 MG/3ML) 0.083% nebulizer solution Take 3  mLs (2.5 mg total) by nebulization every 4 (four) hours as needed for wheezing or shortness of breath. 75 mL 1   albuterol (VENTOLIN HFA) 108 (90 Base) MCG/ACT inhaler Inhale 2 puffs into the lungs every 6 (six) hours as needed for wheezing or shortness of breath. 1 each 1   Alcohol Swabs (DROPSAFE ALCOHOL PREP) 70 % PADS USE TWICE DAILY  WITH  SUGAR  CHECKS 200 each 2   alum & mag hydroxide-simeth (MAALOX/MYLANTA) 200-200-20 MG/5ML suspension Take 30 mLs by mouth every 6 (six) hours as needed for  indigestion or heartburn. 300 mL 1   apixaban (ELIQUIS) 5 MG TABS tablet Take 1 tablet (5 mg total) by mouth 2 (two) times daily. 60 tablet 0   atorvastatin (LIPITOR) 40 MG tablet Take 1 tablet (40 mg total) by mouth daily. 30 tablet 1   Blood Glucose Monitoring Suppl (TRUE METRIX METER) w/Device KIT Use to check blood sugar 4 times a day 1 kit 4   busPIRone (BUSPAR) 5 MG tablet Take 1 tablet (5 mg total) by mouth 2 (two) times daily. 30 tablet 1   cholecalciferol (VITAMIN D3) 25 MCG (1000 UNIT) tablet Take 1,000 Units by mouth daily.     Continuous Glucose Receiver (FREESTYLE LIBRE 2 READER) DEVI To check blood sugars 3-4 times daily due to insulin use. DX E11.40 2 each 5   Continuous Glucose Sensor (FREESTYLE LIBRE 2 SENSOR) MISC To check blood sugars 3-4 times daily due to insulin use. DX E11.40 2 each 5   Cyanocobalamin 1000 MCG/ML KIT Inject 1,000 mcg as directed every 30 (thirty) days.     docusate sodium (COLACE) 100 MG capsule Take 1 capsule (100 mg total) by mouth 2 (two) times daily as needed for mild constipation. 30 capsule 0   feeding supplement (ENSURE ENLIVE / ENSURE PLUS) LIQD Take 237 mLs by mouth 3 (three) times daily between meals. 237 mL 12   Ferrous Sulfate (IRON) 325 (65 Fe) MG TABS Take 1 tablet by mouth 2 (two) times daily.     ferrous sulfate 325 (65 FE) MG EC tablet Take 1 tablet (325 mg total) by mouth 2 (two) times daily. 60 tablet 1   folic acid (FOLVITE) 1 MG tablet TAKE 1 TABLET EVERY DAY 90 tablet 0   furosemide (LASIX) 40 MG tablet Take 0.5 tablets (20 mg total) by mouth daily. 30 tablet 1   gabapentin (NEURONTIN) 400 MG capsule Take 1 capsule (400 mg total) by mouth 3 (three) times daily. 90 capsule 1   insulin lispro (HUMALOG KWIKPEN) 100 UNIT/ML KwikPen Inject 5 Units into the skin 3 (three) times daily before meals. Do not inject insulin if you are not going to eat meal.  Only take before a meal. 15 mL 3   losartan (COZAAR) 25 MG tablet Take 0.5 tablets (12.5 mg  total) by mouth daily. 30 tablet 1   metFORMIN (GLUCOPHAGE) 1000 MG tablet Take 1 tablet (1,000 mg total) by mouth 2 (two) times daily with a meal. 60 tablet 1   metoprolol tartrate (LOPRESSOR) 25 MG tablet Take 0.5 tablets (12.5 mg total) by mouth 2 (two) times daily. 30 tablet 1   montelukast (SINGULAIR) 10 MG tablet Take 1 tablet (10 mg total) by mouth at bedtime. 30 tablet 0   Multiple Vitamin (MULTIVITAMIN WITH MINERALS) TABS tablet Take 1 tablet by mouth daily. 30 tablet 1   naloxone (NARCAN) nasal spray 4 mg/0.1 mL Place 1 spray into the nose as needed for up to  365 doses (for opioid-induced respiratory depresssion). In case of emergency (overdose), spray once into each nostril. If no response within 3 minutes, repeat application and call 911. 1 each 1   Nebulizers (EASY AIR COMPRESSOR NEBULIZER) MISC Use to inhaler nebulizer treatments at needed per instructions on nebulizer prescription 1 each 0   ondansetron (ZOFRAN) 4 MG tablet Take 1 tablet (4 mg total) by mouth daily as needed for nausea or vomiting. 30 tablet 1   OXYGEN Inhale 2 L into the lungs at bedtime.     PAIN MANAGEMENT INTRATHECAL, IT, PUMP 1 each by Intrathecal route. Intrathecal (IT) medication:  Morphine 5.0 mg/ml, Bupivacaine 20.0 mg/ml,  Clonidine 100.0 mcg/ml Patient does not remember current. Adjusted every 3 months at [facility]     pantoprazole (PROTONIX) 40 MG tablet Take 1 tablet (40 mg total) by mouth daily. 30 tablet 1   polyethylene glycol (MIRALAX / GLYCOLAX) 17 g packet Take 17 g by mouth daily as needed for moderate constipation. 10 packet 0   polyvinyl alcohol (LIQUIFILM TEARS) 1.4 % ophthalmic solution Place 2 drops into both eyes as needed for dry eyes. 10 mL 1   TRELEGY ELLIPTA 100-62.5-25 MCG/ACT AEPB INHALE 1 PUFF INTO THE LUNGS DAILY . 42 DAY EXPIRATION AFTER OPENING FOIL TRAY 180 each 3   TRESIBA FLEXTOUCH 100 UNIT/ML FlexTouch Pen INJECT 55 UNITS SUBCUTANEOUSLY AT BEDTIME 30 mL 0   TRULICITY  4.5 MG/0.5ML SOPN INJECT 4.5MG  INTO THE SKIN ONCE WEEKLY AS DIRECTED 12 mL 0   venlafaxine XR (EFFEXOR-XR) 150 MG 24 hr capsule Take 1 capsule (150 mg total) by mouth daily. 30 capsule 0   No current facility-administered medications for this visit.    Allergies  Allergen Reactions   Other Palpitations    IV steroids   Pain Patch [Menthol] Anaphylaxis   Jardiance [Empagliflozin] Other (See Comments)    Yeast infection & dry mouth   Avelox [Moxifloxacin Hcl In Nacl] Other (See Comments)    Upset stomach   Doxycycline Diarrhea   Erythromycin Nausea Only and Other (See Comments)    Can take a Z-Pak just fine Other reaction(s): Other (See Comments) Can take Z-Pak  Can take a Z-Pak just fine   Fentanyl Nausea Only and Rash   Moxifloxacin Hcl Other (See Comments)    Upset stomach Upset stomach   Oxycontin [Oxycodone] Hives   Ozempic [Semaglutide] Nausea Only      Social History   Socioeconomic History   Marital status: Unknown    Spouse name: Harvie Heck   Number of children: 2   Years of education: Not on file   Highest education level: High school graduate  Occupational History   Occupation: retired  Tobacco Use   Smoking status: Former    Current packs/day: 0.00    Average packs/day: 1 pack/day for 30.0 years (30.0 ttl pk-yrs)    Types: Cigarettes    Start date: 04/30/1974    Quit date: 04/30/2004    Years since quitting: 19.4   Smokeless tobacco: Never  Vaping Use   Vaping status: Never Used  Substance and Sexual Activity   Alcohol use: No   Drug use: No   Sexual activity: Not Currently    Birth control/protection: Post-menopausal  Other Topics Concern   Not on file  Social History Narrative   ** Merged History Encounter **       Social Drivers of Health   Financial Resource Strain: Low Risk  (08/20/2023)   Overall Physicist, medical Strain (  CARDIA)    Difficulty of Paying Living Expenses: Not hard at all  Food Insecurity: No Food Insecurity (08/24/2023)    Hunger Vital Sign    Worried About Running Out of Food in the Last Year: Never true    Ran Out of Food in the Last Year: Never true  Transportation Needs: No Transportation Needs (08/24/2023)   PRAPARE - Administrator, Civil Service (Medical): No    Lack of Transportation (Non-Medical): No  Physical Activity: Inactive (12/23/2022)   Exercise Vital Sign    Days of Exercise per Week: 0 days    Minutes of Exercise per Session: 0 min  Stress: No Stress Concern Present (12/23/2022)   [employer] of Occupational Health - Occupational Stress Questionnaire    Feeling of Stress : Only a little  Social Connections: Socially Integrated (08/24/2023)   Social Connection and Isolation Panel [NHANES]    Frequency of Communication with Friends and Family: More than three times a week    Frequency of Social Gatherings with Friends and Family: More than three times a week    Attends Religious Services: More than 4 times per year    Active Member of [employer] or Organizations: Yes    Attends Engineer, structural: More than 4 times per year    Marital Status: Married  Catering manager Violence: Not At Risk (08/24/2023)   Humiliation, Afraid, Rape, and Kick questionnaire    Fear of Current or Ex-Partner: No    Emotionally Abused: No    Physically Abused: No    Sexually Abused: No      Family History  Problem Relation Age of Onset   Cancer Mother 48       lung   Thyroid disease Mother    Cancer Father 4       Pancreatic   Hypertension Sister    Cancer Sister        "cancer on face"   Hyperlipidemia Sister    Osteoporosis Maternal Grandmother    Cancer Paternal Grandmother        colon   Arthritis Paternal Grandfather    Hyperlipidemia Son    Seizures Son    Breast cancer Neg Hx      PHYSICAL EXAM: General:  Well appearing. No respiratory difficulty HEENT: normal Neck: supple. no JVD. Carotids 2+ bilat; no bruits. No lymphadenopathy or thyromegaly appreciated. Cor:  PMI nondisplaced. Regular rate & irregular rhythm. No rubs, gallops or murmurs. Lungs: clear Abdomen: soft, nontender, nondistended. No hepatosplenomegaly. No bruits or masses. Good bowel sounds. Extremities: no cyanosis, clubbing, rash, edema Neuro: alert & oriented x 3, cranial nerves grossly intact. moves all 4 extremities w/o difficulty. Affect pleasant.  ECG: not done   ASSESSMENT & PLAN:  1: NICM with reduced ejection fraction- - suspect due to stress induced after pain pump malfunction & opioid withdrawal in 2018 - NYHA class III - euvolemic - weighing daily; reminded to call for overnight weight gain of > 2 pounds or weekly weight gain of > 5 pounds - weight 135.6 from last visit here 1 month ago - Echo 12/11/20: EF 45% with Grade II DD - Echo 09/11/21: EF 25-30% with Grade II DD - Echo 10/31/21: EF 40-45% with Grade I DD - Echo 08/11/23: EF 25-30% with Grade I DD - continue furosemide 20mg  daily - continue losartan 12.5mg  daily; consider transitioning to entresto if BP maintains - continue metoprolol tartrate 12.5mg  BID - discussed adding spironolactone at next visit if BP  maintains watching K+ closely as she's been both hypo/ hyperkalemic in the past - had yeast infection/ dry mouth with previous jardiance - not adding salt to her food - BNP 08/09/23 was 241.9  2: HTN- - BP  - saw PCP Harvest Dark) 02/25 - BMET 09/16/23 reviewed and showed sodium 139, potassium 4.3, creatinine 1.25 & GFR 46  3: DM- - A1c 06/22/23 was 7.4% - continue insulin - continue metformin 1000mg  BID  4: COPD- - saw pulmonology Meredeth Ide) 11/24 - wearing oxygen at 2L around the clock  5: Atrial fibrillation- - saw cardiology (End) 11/24 - continue apixaban 5mg  BID - continue metoprolol tartrate 12.5mg  BID  6: Chronic pain- - follows with pain clinic - has chronic intrathecal pain pump - continue gabapentin 400mg  TID  7: Anemia- - Hg 09/16/23 reviewed and was 10.7 - sees GI Gerre Pebbles) 03/25 -  continue ferrous sulfate 325mg  BID    Delma Freeze, FNP 09/23/23

## 2023-09-24 ENCOUNTER — Encounter: Payer: Medicare HMO | Admitting: Family

## 2023-09-29 ENCOUNTER — Telehealth: Payer: Self-pay | Admitting: Family

## 2023-09-29 DIAGNOSIS — J440 Chronic obstructive pulmonary disease with acute lower respiratory infection: Secondary | ICD-10-CM | POA: Diagnosis not present

## 2023-09-29 DIAGNOSIS — J9621 Acute and chronic respiratory failure with hypoxia: Secondary | ICD-10-CM | POA: Diagnosis not present

## 2023-09-29 DIAGNOSIS — I130 Hypertensive heart and chronic kidney disease with heart failure and stage 1 through stage 4 chronic kidney disease, or unspecified chronic kidney disease: Secondary | ICD-10-CM | POA: Diagnosis not present

## 2023-09-29 DIAGNOSIS — A419 Sepsis, unspecified organism: Secondary | ICD-10-CM | POA: Diagnosis not present

## 2023-09-29 DIAGNOSIS — I4819 Other persistent atrial fibrillation: Secondary | ICD-10-CM | POA: Diagnosis not present

## 2023-09-29 DIAGNOSIS — J9622 Acute and chronic respiratory failure with hypercapnia: Secondary | ICD-10-CM | POA: Diagnosis not present

## 2023-09-29 DIAGNOSIS — I5043 Acute on chronic combined systolic (congestive) and diastolic (congestive) heart failure: Secondary | ICD-10-CM | POA: Diagnosis not present

## 2023-09-29 DIAGNOSIS — J441 Chronic obstructive pulmonary disease with (acute) exacerbation: Secondary | ICD-10-CM | POA: Diagnosis not present

## 2023-09-29 DIAGNOSIS — J188 Other pneumonia, unspecified organism: Secondary | ICD-10-CM | POA: Diagnosis not present

## 2023-09-29 NOTE — Progress Notes (Signed)
 [facility] Note     PCP: [name], NP (last seen 02/25) Cardiologist: Lonni Hanson, MD (last seen 11/24)  Chief Complaint: shortness of breath  HPI:  Jordan Chan is a 73 y/o female with a history of B12 deficiency, chronic pain syndrome s/p intrathecal pump, paroxysmal atrial fibrillation, stress-induced cardiomyopathy in the setting of pain pump malfunction and opioid withdrawal (2018), HTN, hyperlipidemia, PE (2021), DM, COPD, GERD, OSA, breast cancer, chronic back pain,anemia and IBS.   Admitted 04/15/23 due to worsening of left hip pain at the surgical site associated with increasing redness about the left hip surgical wound. Tachycardic. WBC 17.6. CXR normal.Thought to be septic due to left hip surgical wound infection. Treated with IV fluids, analgesics and empiric IV antibiotics. Transferred to [facility].    Admitted 08/09/23 due to acute respiratory distress found with sats in the 50 to 60% on RA. Found to have multiple PE's and sepsis due to aspiration pneumonia. Initially intubated. Completed antibiotics & successfully extubated. Underwent swallowing evaluation. Heparin  stopped and transitioned to eliquis . Anemia stable.   Echo 12/11/20: EF 45% with Grade II DD Echo 09/11/21: EF 25-30% with Grade II DD Echo 10/31/21: EF 40-45% with Grade I DD Echo 08/11/23: EF 25-30% with Grade I DD  Jordan Chan presents today, with her husband, for a HF follow-up visit with a chief complaint of moderate shortness of breath. Has associated fatigue (improving) and chronic difficulty sleeping. Denies chest pain, cough, palpitations, abdominal distention, dizziness or weight gain. OT walks her without her oxygen  on and it drops to 87-89%         Wearing oxygen  at 2L around the clock. Quit smoking 19 years ago when a grandchild was going to be born. Denies alcohol  use. Not adding salt to her food. Weighing daily   Had a yeast infection and dry mouth with  jardiance  but is willing to try farxiga .   Previous cardiac studies:  LHC 04/28/17:  No angiographically significant coronary artery disease. Severely reduced LV contraction (LVEF 25-30%) with mid and apical akinesis, consistent with Takotsubo (stress-induced) cardiomyopathy. Mildly elevated left ventricular filling pressure.  ROS: All systems negative except what is listed in HPI, PMH and Problem List  Past Medical History:  Diagnosis Date   Arthritis    Asthma    Atrial fibrillation (HCC)    Breast cancer (HCC) 1998   right breast ca with mastectomy and chemotherapy and radiation   Bronchitis    CHF (congestive heart failure) (HCC)    with Morphine  withdrawal   COPD (chronic obstructive pulmonary disease) (HCC)    Diabetes mellitus without complication (HCC)    Diverticulitis    diverticulosis also   Dyspnea    Endometriosis    GERD (gastroesophageal reflux disease)    History of shingles 2000-2005   Hypercholesteremia    Hypertension    IBS (irritable bowel syndrome)    Low back pain    a. Implanted morphine /bupivicaine/clonidine  pump.   Neuropathy    Orthopnea    Oxygen  dependent    uses at night   Personal history of chemotherapy    Personal history of radiation therapy    Pneumonia    pneumonia 5-6 times, history of bronchitis also   Scoliosis    Sleep apnea    does not use cpap   Stroke (HCC) 2010   TIA, 10 years ago   Withdrawal from sedative drug (HCC)    withdrawal from morphine  when pump batteries died  Current Outpatient Medications  Medication Sig Dispense Refill   ACCU-CHEK AVIVA PLUS test strip TEST THREE TIMES DAILY 300 strip 3   Accu-Chek Softclix Lancets lancets TEST BLOOD SUGAR THREE TIMES DAILY 300 each 3   albuterol  (PROVENTIL ) (2.5 MG/3ML) 0.083% nebulizer solution Take 3 mLs (2.5 mg total) by nebulization every 4 (four) hours as needed for wheezing or shortness of breath. 75 mL 1   albuterol  (VENTOLIN  HFA) 108 (90 Base) MCG/ACT inhaler  Inhale 2 puffs into the lungs every 6 (six) hours as needed for wheezing or shortness of breath. 1 each 1   Alcohol  Swabs (DROPSAFE ALCOHOL  PREP) 70 % PADS USE TWICE DAILY  WITH  SUGAR  CHECKS 200 each 2   alum & mag hydroxide-simeth (MAALOX/MYLANTA) 200-200-20 MG/5ML suspension Take 30 mLs by mouth every 6 (six) hours as needed for indigestion or heartburn. 300 mL 1   apixaban  (ELIQUIS ) 5 MG TABS tablet Take 1 tablet (5 mg total) by mouth 2 (two) times daily. 60 tablet 0   atorvastatin  (LIPITOR) 40 MG tablet Take 1 tablet (40 mg total) by mouth daily. 30 tablet 1   Blood Glucose Monitoring Suppl (TRUE METRIX METER) w/Device KIT Use to check blood sugar 4 times a day 1 kit 4   busPIRone  (BUSPAR ) 5 MG tablet Take 1 tablet (5 mg total) by mouth 2 (two) times daily. 30 tablet 1   cholecalciferol  (VITAMIN D3) 25 MCG (1000 UNIT) tablet Take 1,000 Units by mouth daily.     Continuous Glucose Receiver (FREESTYLE LIBRE 2 READER) DEVI To check blood sugars 3-4 times daily due to insulin  use. DX E11.40 2 each 5   Continuous Glucose Sensor (FREESTYLE LIBRE 2 SENSOR) MISC To check blood sugars 3-4 times daily due to insulin  use. DX E11.40 2 each 5   Cyanocobalamin  1000 MCG/ML KIT Inject 1,000 mcg as directed every 30 (thirty) days.     docusate sodium  (COLACE) 100 MG capsule Take 1 capsule (100 mg total) by mouth 2 (two) times daily as needed for mild constipation. 30 capsule 0   feeding supplement (ENSURE ENLIVE / ENSURE PLUS) LIQD Take 237 mLs by mouth 3 (three) times daily between meals. 237 mL 12   Ferrous Sulfate  (IRON ) 325 (65 Fe) MG TABS Take 1 tablet by mouth 2 (two) times daily.     ferrous sulfate  325 (65 FE) MG EC tablet Take 1 tablet (325 mg total) by mouth 2 (two) times daily. 60 tablet 1   folic acid  (FOLVITE ) 1 MG tablet TAKE 1 TABLET EVERY DAY 90 tablet 0   furosemide  (LASIX ) 40 MG tablet Take 0.5 tablets (20 mg total) by mouth daily. 30 tablet 1   gabapentin  (NEURONTIN ) 400 MG capsule Take 1  capsule (400 mg total) by mouth 3 (three) times daily. 90 capsule 1   insulin  lispro (HUMALOG  KWIKPEN) 100 UNIT/ML KwikPen Inject 5 Units into the skin 3 (three) times daily before meals. Do not inject insulin  if you are not going to eat meal.  Only take before a meal. 15 mL 3   losartan  (COZAAR ) 25 MG tablet Take 0.5 tablets (12.5 mg total) by mouth daily. 30 tablet 1   metFORMIN  (GLUCOPHAGE ) 1000 MG tablet Take 1 tablet (1,000 mg total) by mouth 2 (two) times daily with a meal. 60 tablet 1   metoprolol  tartrate (LOPRESSOR ) 25 MG tablet Take 0.5 tablets (12.5 mg total) by mouth 2 (two) times daily. 30 tablet 1   montelukast  (SINGULAIR ) 10 MG tablet Take 1 tablet (  10 mg total) by mouth at bedtime. 30 tablet 0   Multiple Vitamin (MULTIVITAMIN WITH MINERALS) TABS tablet Take 1 tablet by mouth daily. 30 tablet 1   naloxone  (NARCAN ) nasal spray 4 mg/0.1 mL Place 1 spray into the nose as needed for up to 365 doses (for opioid-induced respiratory depresssion). In case of emergency (overdose), spray once into each nostril. If no response within 3 minutes, repeat application and call 911. 1 each 1   Nebulizers (EASY AIR COMPRESSOR NEBULIZER) MISC Use to inhaler nebulizer treatments at needed per instructions on nebulizer prescription 1 each 0   ondansetron  (ZOFRAN ) 4 MG tablet Take 1 tablet (4 mg total) by mouth daily as needed for nausea or vomiting. 30 tablet 1   OXYGEN  Inhale 2 L into the lungs at bedtime.     PAIN MANAGEMENT INTRATHECAL, IT, PUMP 1 each by Intrathecal route. Intrathecal (IT) medication:  Morphine  5.0 mg/ml, Bupivacaine 20.0 mg/ml,  Clonidine  100.0 mcg/ml Patient does not remember current. Adjusted every 3 months at [facility]     pantoprazole  (PROTONIX ) 40 MG tablet Take 1 tablet (40 mg total) by mouth daily. 30 tablet 1   polyethylene glycol (MIRALAX  / GLYCOLAX ) 17 g packet Take 17 g by mouth daily as needed for moderate constipation. 10 packet 0   polyvinyl alcohol  (LIQUIFILM  TEARS) 1.4 % ophthalmic solution Place 2 drops into both eyes as needed for dry eyes. 10 mL 1   TRELEGY ELLIPTA  100-62.5-25 MCG/ACT AEPB INHALE 1 PUFF INTO THE LUNGS DAILY . 42 DAY EXPIRATION AFTER OPENING FOIL TRAY 180 each 3   TRESIBA  FLEXTOUCH 100 UNIT/ML FlexTouch Pen INJECT 55 UNITS SUBCUTANEOUSLY AT BEDTIME 30 mL 0   TRULICITY  4.5 MG/0.5ML SOPN INJECT 4.5MG  INTO THE SKIN ONCE WEEKLY AS DIRECTED 12 mL 0   venlafaxine  XR (EFFEXOR -XR) 150 MG 24 hr capsule Take 1 capsule (150 mg total) by mouth daily. 30 capsule 0   No current facility-administered medications for this visit.    Allergies  Allergen Reactions   Other Palpitations    IV steroids   Pain Patch [Menthol] Anaphylaxis   Jardiance  [Empagliflozin ] Other (See Comments)    Yeast infection & dry mouth   Avelox  [Moxifloxacin  Hcl In Nacl] Other (See Comments)    Upset stomach   Doxycycline  Diarrhea   Erythromycin  Nausea Only and Other (See Comments)    Can take a Z-Pak just fine Other reaction(s): Other (See Comments) Can take Z-Pak  Can take a Z-Pak just fine   Fentanyl  Nausea Only and Rash   Moxifloxacin  Hcl Other (See Comments)    Upset stomach Upset stomach   Oxycontin [Oxycodone] Hives   Ozempic [Semaglutide] Nausea Only      Social History   Socioeconomic History   Marital status: Unknown    Spouse name: Jordan Chan   Number of children: 2   Years of education: Not on file   Highest education level: High school graduate  Occupational History   Occupation: retired  Tobacco Use   Smoking status: Former    Current packs/day: 0.00    Average packs/day: 1 pack/day for 30.0 years (30.0 ttl pk-yrs)    Types: Cigarettes    Start date: 04/30/1974    Quit date: 04/30/2004    Years since quitting: 19.4   Smokeless tobacco: Never  Vaping Use   Vaping status: Never Used  Substance and Sexual Activity   Alcohol  use: No   Drug use: No   Sexual activity: Not Currently  Birth control/protection: Post-menopausal  Other  Topics Concern   Not on file  Social History Narrative   ** Merged History Encounter **       Social Drivers of Health   Financial Resource Strain: Low Risk  (08/20/2023)   Overall Financial Resource Strain (CARDIA)    Difficulty of Paying Living Expenses: Not hard at all  Food Insecurity: No Food Insecurity (08/24/2023)   Hunger Vital Sign    Worried About Running Out of Food in the Last Year: Never true    Ran Out of Food in the Last Year: Never true  Transportation Needs: No Transportation Needs (08/24/2023)   PRAPARE - Administrator, Civil Service (Medical): No    Lack of Transportation (Non-Medical): No  Physical Activity: Inactive (12/23/2022)   Exercise Vital Sign    Days of Exercise per Week: 0 days    Minutes of Exercise per Session: 0 min  Stress: No Stress Concern Present (12/23/2022)   [employer] of Occupational Health - Occupational Stress Questionnaire    Feeling of Stress : Only a little  Social Connections: Socially Integrated (08/24/2023)   Social Connection and Isolation Panel [NHANES]    Frequency of Communication with Friends and Family: More than three times a week    Frequency of Social Gatherings with Friends and Family: More than three times a week    Attends Religious Services: More than 4 times per year    Active Member of [employer] or Organizations: Yes    Attends Banker Meetings: More than 4 times per year    Marital Status: Married  Catering manager Violence: Not At Risk (08/24/2023)   Humiliation, Afraid, Rape, and Kick questionnaire    Fear of Current or Ex-Partner: No    Emotionally Abused: No    Physically Abused: No    Sexually Abused: No      Family History  Problem Relation Age of Onset   Cancer Mother 37       lung   Thyroid  disease Mother    Cancer Father 23       Pancreatic   Hypertension Sister    Cancer Sister        cancer on face   Hyperlipidemia Sister    Osteoporosis Maternal Grandmother     Cancer Paternal Grandmother        colon   Arthritis Paternal Grandfather    Hyperlipidemia Son    Seizures Son    Breast cancer Neg Hx    Vitals:   09/30/23 1110  BP: 128/67  Pulse: 92  SpO2: 97%  Weight: 135 lb (61.2 kg)   Wt Readings from Last 3 Encounters:  09/30/23 135 lb (61.2 kg)  09/16/23 139 lb 6.4 oz (63.2 kg)  09/04/23 136 lb 9.6 oz (62 kg)   Lab Results  Component Value Date   CREATININE 1.25 (H) 09/16/2023   CREATININE 1.29 (H) 09/04/2023   CREATININE 1.05 (H) 08/28/2023     PHYSICAL EXAM:  General: Well appearing. No resp difficulty HEENT: normal Neck: supple, no JVD Cor: Regular rhythm, rate. No rubs, gallops or murmurs Lungs: clear Abdomen: soft, nontender, nondistended. Extremities: no cyanosis, clubbing, rash, edema Neuro: alert & oriented X 3. Moves all 4 extremities w/o difficulty. Affect pleasant   ECG: not done   ASSESSMENT & PLAN:  1: NICM with reduced ejection fraction- - suspect due to stress induced after pain pump malfunction & opioid withdrawal in 2018 - NYHA class III - euvolemic -  weighing daily; reminded to call for overnight weight gain of > 2 pounds or weekly weight gain of > 5 pounds - weight stable from last visit here 1 month ago - Echo 12/11/20: EF 45% with Grade II DD - Echo 09/11/21: EF 25-30% with Grade II DD - Echo 10/31/21: EF 40-45% with Grade I DD - Echo 08/11/23: EF 25-30% with Grade I DD - had yeast infection/ dry mouth with previous jardiance  but is agreeable to trying farxiga  - begin farxiga  10mg  daily; if Jordan Chan develops yeast infection w/ this, it can be stopped - continue furosemide  20mg  daily; if notes increase in urination with starting farxiga , can change furosemide  to PRN - continue losartan  12.5mg  daily; consider transitioning to entresto  if BP maintains - continue metoprolol  tartrate 12.5mg  BID - discussed adding spironolactone if BP maintains watching K+ closely as Jordan Chan's been both hypo/ hyperkalemic in the  past - not adding salt to her food - BNP 08/09/23 was 241.9  2: HTN- - BP 128/67 - saw PCP Felton) 02/25 - BMET 09/16/23 reviewed and showed sodium 139, potassium 4.3, creatinine 1.25 & GFR 46  3: DM- - A1c 06/22/23 was 7.4% - continue insulin  - continue metformin  1000mg  BID  4: COPD- - saw pulmonology Alica) 11/24 - wearing oxygen  at 2L around the clock - has OT coming to home  5: Atrial fibrillation- - saw cardiology (End) 11/24 - continue apixaban  5mg  BID - continue metoprolol  tartrate 12.5mg  BID  6: Chronic pain- - follows with pain clinic - has chronic intrathecal pain pump - continue gabapentin  400mg  TID  7: Anemia- - Hg 09/16/23 reviewed and was 10.7 - sees GI Elodie) 03/25 - continue ferrous sulfate  325mg  BID    Return in 1 month, sooner if needed.  Ellouise DELENA Class, FNP 09/29/23

## 2023-09-29 NOTE — Telephone Encounter (Signed)
 Pt confirmed appt for 09/30/23

## 2023-09-30 ENCOUNTER — Ambulatory Visit: Payer: Medicare HMO | Attending: Family | Admitting: Family

## 2023-09-30 ENCOUNTER — Encounter: Payer: Self-pay | Admitting: Family

## 2023-09-30 VITALS — BP 128/67 | HR 92 | Wt 135.0 lb

## 2023-09-30 DIAGNOSIS — M5459 Other low back pain: Secondary | ICD-10-CM | POA: Insufficient documentation

## 2023-09-30 DIAGNOSIS — Z79899 Other long term (current) drug therapy: Secondary | ICD-10-CM | POA: Diagnosis not present

## 2023-09-30 DIAGNOSIS — I428 Other cardiomyopathies: Secondary | ICD-10-CM | POA: Insufficient documentation

## 2023-09-30 DIAGNOSIS — E538 Deficiency of other specified B group vitamins: Secondary | ICD-10-CM | POA: Insufficient documentation

## 2023-09-30 DIAGNOSIS — J449 Chronic obstructive pulmonary disease, unspecified: Secondary | ICD-10-CM | POA: Diagnosis not present

## 2023-09-30 DIAGNOSIS — T402X6S Underdosing of other opioids, sequela: Secondary | ICD-10-CM | POA: Insufficient documentation

## 2023-09-30 DIAGNOSIS — R0602 Shortness of breath: Secondary | ICD-10-CM | POA: Diagnosis present

## 2023-09-30 DIAGNOSIS — I5020 Unspecified systolic (congestive) heart failure: Secondary | ICD-10-CM | POA: Diagnosis not present

## 2023-09-30 DIAGNOSIS — I4819 Other persistent atrial fibrillation: Secondary | ICD-10-CM | POA: Diagnosis not present

## 2023-09-30 DIAGNOSIS — Z87891 Personal history of nicotine dependence: Secondary | ICD-10-CM | POA: Diagnosis not present

## 2023-09-30 DIAGNOSIS — K589 Irritable bowel syndrome without diarrhea: Secondary | ICD-10-CM | POA: Diagnosis not present

## 2023-09-30 DIAGNOSIS — Z79891 Long term (current) use of opiate analgesic: Secondary | ICD-10-CM | POA: Insufficient documentation

## 2023-09-30 DIAGNOSIS — Z86711 Personal history of pulmonary embolism: Secondary | ICD-10-CM | POA: Diagnosis not present

## 2023-09-30 DIAGNOSIS — Z9689 Presence of other specified functional implants: Secondary | ICD-10-CM | POA: Diagnosis not present

## 2023-09-30 DIAGNOSIS — Z7901 Long term (current) use of anticoagulants: Secondary | ICD-10-CM | POA: Insufficient documentation

## 2023-09-30 DIAGNOSIS — I110 Hypertensive heart disease with heart failure: Secondary | ICD-10-CM | POA: Insufficient documentation

## 2023-09-30 DIAGNOSIS — I509 Heart failure, unspecified: Secondary | ICD-10-CM | POA: Insufficient documentation

## 2023-09-30 DIAGNOSIS — D508 Other iron deficiency anemias: Secondary | ICD-10-CM

## 2023-09-30 DIAGNOSIS — T85615S Breakdown (mechanical) of other nervous system device, implant or graft, sequela: Secondary | ICD-10-CM | POA: Insufficient documentation

## 2023-09-30 DIAGNOSIS — Z7984 Long term (current) use of oral hypoglycemic drugs: Secondary | ICD-10-CM | POA: Insufficient documentation

## 2023-09-30 DIAGNOSIS — K219 Gastro-esophageal reflux disease without esophagitis: Secondary | ICD-10-CM | POA: Diagnosis not present

## 2023-09-30 DIAGNOSIS — Z853 Personal history of malignant neoplasm of breast: Secondary | ICD-10-CM | POA: Insufficient documentation

## 2023-09-30 DIAGNOSIS — Y638 Failure in dosage during other surgical and medical care: Secondary | ICD-10-CM | POA: Diagnosis not present

## 2023-09-30 DIAGNOSIS — Z7985 Long-term (current) use of injectable non-insulin antidiabetic drugs: Secondary | ICD-10-CM | POA: Insufficient documentation

## 2023-09-30 DIAGNOSIS — G894 Chronic pain syndrome: Secondary | ICD-10-CM | POA: Insufficient documentation

## 2023-09-30 DIAGNOSIS — Z9981 Dependence on supplemental oxygen: Secondary | ICD-10-CM | POA: Insufficient documentation

## 2023-09-30 DIAGNOSIS — I10 Essential (primary) hypertension: Secondary | ICD-10-CM

## 2023-09-30 DIAGNOSIS — Z794 Long term (current) use of insulin: Secondary | ICD-10-CM | POA: Diagnosis not present

## 2023-09-30 DIAGNOSIS — I480 Paroxysmal atrial fibrillation: Secondary | ICD-10-CM | POA: Diagnosis not present

## 2023-09-30 DIAGNOSIS — E785 Hyperlipidemia, unspecified: Secondary | ICD-10-CM | POA: Insufficient documentation

## 2023-09-30 DIAGNOSIS — G4733 Obstructive sleep apnea (adult) (pediatric): Secondary | ICD-10-CM | POA: Insufficient documentation

## 2023-09-30 DIAGNOSIS — D649 Anemia, unspecified: Secondary | ICD-10-CM | POA: Insufficient documentation

## 2023-09-30 DIAGNOSIS — E119 Type 2 diabetes mellitus without complications: Secondary | ICD-10-CM

## 2023-09-30 MED ORDER — DAPAGLIFLOZIN PROPANEDIOL 10 MG PO TABS: 10.0000 mg | ORAL_TABLET | Freq: Every day | ORAL | Status: DC

## 2023-09-30 NOTE — Progress Notes (Signed)
 Medication Samples have been provided to the patient.  Drug name: Marcelline Deist       Strength: 10 MG        Qty: 4  LOT: [id number]  Exp.Date: 05/03/26  Dosing instructions: Take one 10 MG tablet by mouth once daily  The patient has been instructed regarding the correct time, dose, and frequency of taking this medication, including desired effects and most common side effects.   Simonne Maffucci 11:47 AM 09/30/2023

## 2023-09-30 NOTE — Patient Instructions (Signed)
 Medication Changes:  START taking Farxiga: Take 1 (one) 10 MG tablet by mouth once daily. One month of samples provided today.   Special Instructions // Education:  Do the following things EVERYDAY: Weigh yourself in the morning before breakfast. Write it down and keep it in a log. Take your medicines as prescribed Eat low salt foods--Limit salt (sodium) to 2000 mg per day.  Stay as active as you can everyday Limit all fluids for the day to less than 2 liters   Follow-Up in: 1 month with Clarisa Kindred, FNP    If you have any questions or concerns before your next appointment please send [place] a message through [facility] or call our office at [phone] Monday-Friday 8 am-5 pm.   If you have an urgent need after hours on the weekend please call your Primary Cardiologist or the [facility] in [place] at [phone].   At the [facility], you and your health needs are our priority. We have a designated team specialized in the treatment of Heart Failure. This Care Team includes your primary Heart Failure Specialized Cardiologist (physician), Advanced Practice Providers (APPs- Physician Assistants and Nurse Practitioners), and Pharmacist who all work together to provide you with the care you need, when you need it.   You may see any of the following providers on your designated Care Team at your next follow up:  Dr. Arvilla Meres Dr. Marca Ancona Dr. Dorthula Nettles Dr. Theresia Bough Tonye Becket, NP Robbie Lis, [place] [address] [employer], [place] Brynda Peon, NP [place] Lee, NP Clarisa Kindred, NP Enos Fling, PharmD

## 2023-10-01 DIAGNOSIS — J440 Chronic obstructive pulmonary disease with acute lower respiratory infection: Secondary | ICD-10-CM | POA: Diagnosis not present

## 2023-10-01 DIAGNOSIS — I5043 Acute on chronic combined systolic (congestive) and diastolic (congestive) heart failure: Secondary | ICD-10-CM | POA: Diagnosis not present

## 2023-10-01 DIAGNOSIS — J9621 Acute and chronic respiratory failure with hypoxia: Secondary | ICD-10-CM | POA: Diagnosis not present

## 2023-10-01 DIAGNOSIS — J9622 Acute and chronic respiratory failure with hypercapnia: Secondary | ICD-10-CM | POA: Diagnosis not present

## 2023-10-01 DIAGNOSIS — A419 Sepsis, unspecified organism: Secondary | ICD-10-CM | POA: Diagnosis not present

## 2023-10-01 DIAGNOSIS — I4819 Other persistent atrial fibrillation: Secondary | ICD-10-CM | POA: Diagnosis not present

## 2023-10-01 DIAGNOSIS — J441 Chronic obstructive pulmonary disease with (acute) exacerbation: Secondary | ICD-10-CM | POA: Diagnosis not present

## 2023-10-01 DIAGNOSIS — J188 Other pneumonia, unspecified organism: Secondary | ICD-10-CM | POA: Diagnosis not present

## 2023-10-01 DIAGNOSIS — I130 Hypertensive heart and chronic kidney disease with heart failure and stage 1 through stage 4 chronic kidney disease, or unspecified chronic kidney disease: Secondary | ICD-10-CM | POA: Diagnosis not present

## 2023-10-05 DIAGNOSIS — J441 Chronic obstructive pulmonary disease with (acute) exacerbation: Secondary | ICD-10-CM | POA: Diagnosis not present

## 2023-10-05 DIAGNOSIS — J9621 Acute and chronic respiratory failure with hypoxia: Secondary | ICD-10-CM | POA: Diagnosis not present

## 2023-10-05 DIAGNOSIS — I5043 Acute on chronic combined systolic (congestive) and diastolic (congestive) heart failure: Secondary | ICD-10-CM | POA: Diagnosis not present

## 2023-10-05 DIAGNOSIS — A419 Sepsis, unspecified organism: Secondary | ICD-10-CM | POA: Diagnosis not present

## 2023-10-05 DIAGNOSIS — I4819 Other persistent atrial fibrillation: Secondary | ICD-10-CM | POA: Diagnosis not present

## 2023-10-05 DIAGNOSIS — J440 Chronic obstructive pulmonary disease with acute lower respiratory infection: Secondary | ICD-10-CM | POA: Diagnosis not present

## 2023-10-05 DIAGNOSIS — J9622 Acute and chronic respiratory failure with hypercapnia: Secondary | ICD-10-CM | POA: Diagnosis not present

## 2023-10-05 DIAGNOSIS — I130 Hypertensive heart and chronic kidney disease with heart failure and stage 1 through stage 4 chronic kidney disease, or unspecified chronic kidney disease: Secondary | ICD-10-CM | POA: Diagnosis not present

## 2023-10-05 DIAGNOSIS — J188 Other pneumonia, unspecified organism: Secondary | ICD-10-CM | POA: Diagnosis not present

## 2023-10-05 LAB — HM DIABETES EYE EXAM

## 2023-10-06 DIAGNOSIS — J441 Chronic obstructive pulmonary disease with (acute) exacerbation: Secondary | ICD-10-CM | POA: Diagnosis not present

## 2023-10-06 DIAGNOSIS — J9622 Acute and chronic respiratory failure with hypercapnia: Secondary | ICD-10-CM | POA: Diagnosis not present

## 2023-10-06 DIAGNOSIS — I4819 Other persistent atrial fibrillation: Secondary | ICD-10-CM | POA: Diagnosis not present

## 2023-10-06 DIAGNOSIS — J440 Chronic obstructive pulmonary disease with acute lower respiratory infection: Secondary | ICD-10-CM | POA: Diagnosis not present

## 2023-10-06 DIAGNOSIS — I130 Hypertensive heart and chronic kidney disease with heart failure and stage 1 through stage 4 chronic kidney disease, or unspecified chronic kidney disease: Secondary | ICD-10-CM | POA: Diagnosis not present

## 2023-10-06 DIAGNOSIS — A419 Sepsis, unspecified organism: Secondary | ICD-10-CM | POA: Diagnosis not present

## 2023-10-06 DIAGNOSIS — J9621 Acute and chronic respiratory failure with hypoxia: Secondary | ICD-10-CM | POA: Diagnosis not present

## 2023-10-06 DIAGNOSIS — I5043 Acute on chronic combined systolic (congestive) and diastolic (congestive) heart failure: Secondary | ICD-10-CM | POA: Diagnosis not present

## 2023-10-06 DIAGNOSIS — J188 Other pneumonia, unspecified organism: Secondary | ICD-10-CM | POA: Diagnosis not present

## 2023-10-07 NOTE — Progress Notes (Signed)
 Celso Amy, PA-C [address]  [address]  [place], [place] [address]  Main: [phone]  Fax: [phone]   Primary Care Physician: Marjie Skiff, NP  Primary Gastroenterologist:  Celso Amy, PA-C / Dr. Lannette Donath    CC: Nausea, decreased appetite, chronic constipation  HPI: Jordan Chan is a 73 y.o. female presents for nausea, decreased appetite, and constipation.  She has hx of chronic constipation since childhood.  Has a BM once every 10 days.  Has hard stools and straining.  Sits on the toilet for a long time.  She has an implanted morphine pump in RLQ abdomen (for chronic pain).  Admits to chronic nausea, early satiety, abdominal bloating, and generalized abdominal pain along her waistline.  Nausea is worse after eating.  She thinks her symptoms are due to constipation.  She took Miralax in the past which did not work well and discontinued.  No current treatment for constipation.  Medical history significant for CHF, hypertension, diabetes, COPD, A-fib, chronic pain, iron deficiency anemia, B12 deficiency, stress-induced cardiomyopathy, PE's (2021, 2025), GERD, sleep apnea, breast cancer, and IBS.  Hospitalized 04/2023 for sepsis due to left hip surgical wound treated with IV antibiotics.  Hospitalized 08/2023 for acute respiratory distress and found to have multiple PEs and sepsis due to aspiration pneumonia.  Was intubated and extubated during hospitalization.  Completed antibiotics.  Started on Eliquis.  Echo 08/11/2023 LVEF 25 to 30%.  (Previous echocardiogram 11/2021 showed EF 67%; echo 10/2022 showed EF 45 to 50%).  Cardiologist Dr. Okey Dupre.  Last follow-up visit 09/30/2023 at [facility].  Currently wearing oxygen 2 L.  Quit smoking 19 years ago.  Currently taking ferrous sulfate 325 mg twice daily, vitamin B12, folate, Eliquis 5 mg twice daily, Protonix 40 Mg daily.  09/16/23 Labs: Stable improved hemoglobin 10.7, MCV 91, platelets 393, WBC 8.9.  She  has chronic anemia.  BUN 16, creatinine 1.25, GFR 46.  Normal LFTs.  12/2021 EGD by Dr. Allegra Lai: Mild erythema in the gastric antrum, otherwise normal.  Biopsies negative for H. Pylori and Barretts.  12/2021 colonoscopy by Dr. Allegra Lai: Poor prep (after taking 2 day prep).  Stool in the entire colon.  Repeat colonoscopy was recommended in 6 months with 2-day prep.  12/2016 colonoscopy: Also showed poor prep. 12/2016 EGD: Normal  Current Outpatient Medications  Medication Sig Dispense Refill   ACCU-CHEK AVIVA PLUS test strip TEST THREE TIMES DAILY 300 strip 3   Accu-Chek Softclix Lancets lancets TEST BLOOD SUGAR THREE TIMES DAILY 300 each 3   albuterol (PROVENTIL) (2.5 MG/3ML) 0.083% nebulizer solution Take 3 mLs (2.5 mg total) by nebulization every 4 (four) hours as needed for wheezing or shortness of breath. 75 mL 1   albuterol (VENTOLIN HFA) 108 (90 Base) MCG/ACT inhaler Inhale 2 puffs into the lungs every 6 (six) hours as needed for wheezing or shortness of breath. 1 each 1   alum & mag hydroxide-simeth (MAALOX/MYLANTA) 200-200-20 MG/5ML suspension Take 30 mLs by mouth every 6 (six) hours as needed for indigestion or heartburn. 300 mL 1   apixaban (ELIQUIS) 5 MG TABS tablet Take 1 tablet (5 mg total) by mouth 2 (two) times daily. 60 tablet 0   atorvastatin (LIPITOR) 40 MG tablet Take 1 tablet (40 mg total) by mouth daily. 30 tablet 1   Blood Glucose Monitoring Suppl (TRUE METRIX METER) w/Device KIT Use to check blood sugar 4 times a day 1 kit 4   busPIRone (BUSPAR) 5 MG  tablet Take 1 tablet (5 mg total) by mouth 2 (two) times daily. 30 tablet 1   cholecalciferol (VITAMIN D3) 25 MCG (1000 UNIT) tablet Take 1,000 Units by mouth daily.     Continuous Glucose Receiver (FREESTYLE LIBRE 2 READER) DEVI To check blood sugars 3-4 times daily due to insulin use. DX E11.40 2 each 5   Continuous Glucose Sensor (FREESTYLE LIBRE 2 SENSOR) MISC To check blood sugars 3-4 times daily due to insulin use. DX E11.40 2  each 5   Cyanocobalamin 1000 MCG/ML KIT Inject 1,000 mcg as directed every 30 (thirty) days.     dapagliflozin propanediol (FARXIGA) 10 MG TABS tablet Take 1 tablet (10 mg total) by mouth daily before breakfast.     docusate sodium (COLACE) 100 MG capsule Take 1 capsule (100 mg total) by mouth 2 (two) times daily as needed for mild constipation. 30 capsule 0   feeding supplement (ENSURE ENLIVE / ENSURE PLUS) LIQD Take 237 mLs by mouth 3 (three) times daily between meals. 237 mL 12   Ferrous Sulfate (IRON) 325 (65 Fe) MG TABS Take 1 tablet by mouth 2 (two) times daily.     ferrous sulfate 325 (65 FE) MG EC tablet Take 1 tablet (325 mg total) by mouth 2 (two) times daily. 60 tablet 1   folic acid (FOLVITE) 1 MG tablet TAKE 1 TABLET EVERY DAY 90 tablet 0   furosemide (LASIX) 40 MG tablet Take 0.5 tablets (20 mg total) by mouth daily. 30 tablet 1   gabapentin (NEURONTIN) 400 MG capsule Take 1 capsule (400 mg total) by mouth 3 (three) times daily. 90 capsule 1   insulin lispro (HUMALOG KWIKPEN) 100 UNIT/ML KwikPen Inject 5 Units into the skin 3 (three) times daily before meals. Do not inject insulin if you are not going to eat meal.  Only take before a meal. 15 mL 3   losartan (COZAAR) 25 MG tablet Take 0.5 tablets (12.5 mg total) by mouth daily. 30 tablet 1   metFORMIN (GLUCOPHAGE) 1000 MG tablet Take 1 tablet (1,000 mg total) by mouth 2 (two) times daily with a meal. 60 tablet 1   metoprolol tartrate (LOPRESSOR) 25 MG tablet Take 0.5 tablets (12.5 mg total) by mouth 2 (two) times daily. 30 tablet 1   montelukast (SINGULAIR) 10 MG tablet Take 1 tablet (10 mg total) by mouth at bedtime. 30 tablet 0   Multiple Vitamin (MULTIVITAMIN WITH MINERALS) TABS tablet Take 1 tablet by mouth daily. 30 tablet 1   naloxone (NARCAN) nasal spray 4 mg/0.1 mL Place 1 spray into the nose as needed for up to 365 doses (for opioid-induced respiratory depresssion). In case of emergency (overdose), spray once into each nostril.  If no response within 3 minutes, repeat application and call 911. 1 each 1   Nebulizers (EASY AIR COMPRESSOR NEBULIZER) MISC Use to inhaler nebulizer treatments at needed per instructions on nebulizer prescription 1 each 0   ondansetron (ZOFRAN) 4 MG tablet Take 1 tablet (4 mg total) by mouth daily as needed for nausea or vomiting. 30 tablet 1   OXYGEN Inhale 2 L into the lungs at bedtime.     PAIN MANAGEMENT INTRATHECAL, IT, PUMP 1 each by Intrathecal route. Intrathecal (IT) medication:  Morphine 5.0 mg/ml, Bupivacaine 20.0 mg/ml,  Clonidine 100.0 mcg/ml Patient does not remember current. Adjusted every 3 months at [facility]     pantoprazole (PROTONIX) 40 MG tablet Take 1 tablet (40 mg total) by mouth daily. 30 tablet 1  polyethylene glycol (MIRALAX / GLYCOLAX) 17 g packet Take 17 g by mouth daily as needed for moderate constipation. 10 packet 0   TRELEGY ELLIPTA 100-62.5-25 MCG/ACT AEPB INHALE 1 PUFF INTO THE LUNGS DAILY . 42 DAY EXPIRATION AFTER OPENING FOIL TRAY 180 each 3   TRESIBA FLEXTOUCH 100 UNIT/ML FlexTouch Pen INJECT 55 UNITS SUBCUTANEOUSLY AT BEDTIME 30 mL 0   TRULICITY 4.5 MG/0.5ML SOPN INJECT 4.5MG  INTO THE SKIN ONCE WEEKLY AS DIRECTED 12 mL 0   venlafaxine XR (EFFEXOR-XR) 150 MG 24 hr capsule Take 1 capsule (150 mg total) by mouth daily. 30 capsule 0   No current facility-administered medications for this visit.    Allergies as of 10/08/2023 - Review Complete 10/08/2023  Allergen Reaction Noted   Other Palpitations 03/19/2015   Pain patch [menthol] Anaphylaxis 03/19/2015   Jardiance [empagliflozin] Other (See Comments) 08/28/2023   Avelox [moxifloxacin hcl in nacl] Other (See Comments) 06/18/2015   Doxycycline Diarrhea 06/12/2017   Erythromycin Nausea Only and Other (See Comments) 01/13/2014   Fentanyl Nausea Only and Rash 06/18/2015   Moxifloxacin hcl Other (See Comments) 06/18/2015   Oxycontin [oxycodone] Hives 10/01/2015   Ozempic [semaglutide] Nausea Only  02/05/2018    Past Medical History:  Diagnosis Date   Arthritis    Asthma    Atrial fibrillation (HCC)    Breast cancer (HCC) 1998   right breast ca with mastectomy and chemotherapy and radiation   Bronchitis    CHF (congestive heart failure) (HCC)    "with Morphine withdrawal"   COPD (chronic obstructive pulmonary disease) (HCC)    Diabetes mellitus without complication (HCC)    Diverticulitis    diverticulosis also   Dyspnea    Endometriosis    GERD (gastroesophageal reflux disease)    History of shingles 2000-2005   Hypercholesteremia    Hypertension    IBS (irritable bowel syndrome)    Low back pain    a. Implanted morphine/bupivicaine/clonidine pump.   Neuropathy    Orthopnea    Oxygen dependent    uses at night   Personal history of chemotherapy    Personal history of radiation therapy    Pneumonia    pneumonia 5-6 times, history of bronchitis also   Scoliosis    Sleep apnea    does not use cpap   Stroke (HCC) 2010   TIA, 10 years ago   Withdrawal from sedative drug (HCC)    withdrawal from morphine when pump batteries died    Past Surgical History:  Procedure Laterality Date   ABDOMINAL HYSTERECTOMY  1987   BACK SURGERY     Tailbone removed following fracture   BREAST SURGERY Right    mastectomy   CARDIAC CATHETERIZATION     CATARACT EXTRACTION W/PHACO Right 05/19/2019   Procedure: CATARACT EXTRACTION PHACO AND INTRAOCULAR LENS PLACEMENT (IOC), RIGHT, DIABETIC;  Surgeon: Elliot Cousin, MD;  Location: [facility] ORS;  Service: Ophthalmology;  Laterality: Right;  Lot # [id number] H [place]: 00:43.8 CDE: 4.59   CATARACT EXTRACTION W/PHACO Left 06/16/2019   Procedure: CATARACT EXTRACTION PHACO AND INTRAOCULAR LENS PLACEMENT (IOC) LEFT VISION BLUE DIABETIC;  Surgeon: Elliot Cousin, MD;  Location: [facility];  Service: Ophthalmology;  Laterality: Left;  Lot #[id number] H [place]: 00:46.9 CDE: 6.53   COCCYX REMOVAL     COLONOSCOPY WITH PROPOFOL N/A 01/01/2017   Procedure:  COLONOSCOPY WITH PROPOFOL;  Surgeon: Wyline Mood, MD;  Location: [facility] ENDOSCOPY;  Service: Endoscopy;  Laterality: N/A;   COLONOSCOPY WITH PROPOFOL N/A 12/11/2021   Procedure: COLONOSCOPY WITH  PROPOFOL;  Surgeon: Toney Reil, MD;  Location: [facility] ENDOSCOPY;  Service: Gastroenterology;  Laterality: N/A;   ELBOW ARTHROSCOPY WITH TENDON RECONSTRUCTION     ESOPHAGOGASTRODUODENOSCOPY (EGD) WITH PROPOFOL N/A 01/01/2017   Procedure: ESOPHAGOGASTRODUODENOSCOPY (EGD) WITH PROPOFOL;  Surgeon: Wyline Mood, MD;  Location[facility] ENDOSCOPY;  Service: Endoscopy;  Laterality: N/A;   ESOPHAGOGASTRODUODENOSCOPY (EGD) WITH PROPOFOL N/A 12/11/2021   Procedure: ESOPHAGOGASTRODUODENOSCOPY (EGD) WITH PROPOFOL;  Surgeon: Toney Reil, MD;  Location: [facility] ENDOSCOPY;  Service: Gastroenterology;  Laterality: N/A;   INTRATHECAL PUMP IMPLANT     LEFT HEART CATH AND CORONARY ANGIOGRAPHY N/A 04/28/2017   Procedure: LEFT HEART CATH AND CORONARY ANGIOGRAPHY;  Surgeon: Yvonne Kendall, MD;  Location: [facility] INVASIVE CV LAB;  Service: Cardiovascular;  Laterality: N/A;   MASTECTOMY Right 06/1997   morphine pump  2011   PLANTAR FASCIA RELEASE     TOTAL HIP ARTHROPLASTY Left    TRIGGER FINGER RELEASE      Review of Systems:    All systems reviewed and negative except where noted in HPI.   Physical Examination:   BP (!) 108/54   Pulse 99   Temp 97.7 F (36.5 C)   Ht 4\' 10"  (1.473 m)   Wt 136 lb 6.4 oz (61.9 kg)   LMP  (LMP Unknown)   BMI 28.51 kg/m   General: Feeble, elderly, chronically-ill appearing female; in no acute distress. Wearing 2 Liters Oxygen.  Able to get on and off exam tablet with my help. Lungs: Clear to auscultation bilaterally. Non-labored on 2 L oxygen. Heart: Regular rate and rhythm, no murmurs rubs or gallops.  Abdomen: Bowel sounds are normal; Abdomen is Soft; No hepatosplenomegaly, masses or hernias;  No Abdominal Tenderness; No guarding or rebound tenderness.   Neuro: Alert and  oriented x 3.  Grossly intact.  Psych: Alert and cooperative, normal mood and affect.   Imaging Studies: No results found.  Assessment and Plan:   Jordan Chan is a 73 y.o. y/o female presents for:  1.  Chronic Nausea -suspect due to to chronic constipation, polypharmacy, and adverse side effects of multiple medications.  Treating underlying constipation and hopefully her nausea will improve.   Continue Protonix 40 mg once daily for GERD.   Continue Zofran as needed.  2.  Chronic Constipation  Gave samples of Linzess 72 mcg QD for 1 week, then 145 mcg QD for 1 week, then 290 mcg QD for 1 week.  She will let me know which dose works best, and then she can call me back for a prescription.   3.  Chronic anemia: Hx Iron and B12 Defic. -Currently stable and improved.  Followed by PCP.    Continue iron and B12 supplements.  4.  Multiple comorbidities; declining health in the past year.  She has had a significant decline in her health in the past 6 months.  Was hospitalized January 2025 with multiple PEs, sepsis, pneumonia, was intubated.  Currently on oxygen.  5.  Colon cancer screening  She would be considered high risk for a colonoscopy procedure given her current health status.  Follow-up with Dr. Allegra Lai in 6 to 8 weeks to further discuss.  She may be a candidate for a Cologuard or CT colonography.  Need to get constipation under control so that the prep will work.  Celso Amy, PA-C  Follow up 6 - 8 weeks with Dr. Allegra Lai

## 2023-10-08 ENCOUNTER — Ambulatory Visit: Payer: Medicare HMO | Attending: Pain Medicine | Admitting: Pain Medicine

## 2023-10-08 ENCOUNTER — Encounter: Payer: Self-pay | Admitting: Pain Medicine

## 2023-10-08 ENCOUNTER — Ambulatory Visit (INDEPENDENT_AMBULATORY_CARE_PROVIDER_SITE_OTHER): Payer: Medicare HMO | Admitting: Physician Assistant

## 2023-10-08 ENCOUNTER — Encounter: Payer: Self-pay | Admitting: Physician Assistant

## 2023-10-08 VITALS — BP 127/65 | HR 111 | Temp 96.4°F | Resp 18 | Ht <= 58 in | Wt 132.0 lb

## 2023-10-08 VITALS — BP 108/54 | HR 99 | Temp 97.7°F | Ht <= 58 in | Wt 136.4 lb

## 2023-10-08 DIAGNOSIS — A419 Sepsis, unspecified organism: Secondary | ICD-10-CM | POA: Diagnosis not present

## 2023-10-08 DIAGNOSIS — R1084 Generalized abdominal pain: Secondary | ICD-10-CM

## 2023-10-08 DIAGNOSIS — M79605 Pain in left leg: Secondary | ICD-10-CM | POA: Diagnosis present

## 2023-10-08 DIAGNOSIS — I4819 Other persistent atrial fibrillation: Secondary | ICD-10-CM | POA: Diagnosis not present

## 2023-10-08 DIAGNOSIS — M79604 Pain in right leg: Secondary | ICD-10-CM | POA: Diagnosis present

## 2023-10-08 DIAGNOSIS — G8929 Other chronic pain: Secondary | ICD-10-CM | POA: Insufficient documentation

## 2023-10-08 DIAGNOSIS — R937 Abnormal findings on diagnostic imaging of other parts of musculoskeletal system: Secondary | ICD-10-CM | POA: Insufficient documentation

## 2023-10-08 DIAGNOSIS — Z451 Encounter for adjustment and management of infusion pump: Secondary | ICD-10-CM | POA: Diagnosis present

## 2023-10-08 DIAGNOSIS — Z8639 Personal history of other endocrine, nutritional and metabolic disease: Secondary | ICD-10-CM

## 2023-10-08 DIAGNOSIS — K5904 Chronic idiopathic constipation: Secondary | ICD-10-CM

## 2023-10-08 DIAGNOSIS — Z978 Presence of other specified devices: Secondary | ICD-10-CM | POA: Diagnosis present

## 2023-10-08 DIAGNOSIS — G894 Chronic pain syndrome: Secondary | ICD-10-CM | POA: Diagnosis present

## 2023-10-08 DIAGNOSIS — M51372 Other intervertebral disc degeneration, lumbosacral region with discogenic back pain and lower extremity pain: Secondary | ICD-10-CM | POA: Insufficient documentation

## 2023-10-08 DIAGNOSIS — M545 Low back pain, unspecified: Secondary | ICD-10-CM | POA: Diagnosis present

## 2023-10-08 DIAGNOSIS — J440 Chronic obstructive pulmonary disease with acute lower respiratory infection: Secondary | ICD-10-CM | POA: Diagnosis not present

## 2023-10-08 DIAGNOSIS — I130 Hypertensive heart and chronic kidney disease with heart failure and stage 1 through stage 4 chronic kidney disease, or unspecified chronic kidney disease: Secondary | ICD-10-CM | POA: Diagnosis not present

## 2023-10-08 DIAGNOSIS — K5909 Other constipation: Secondary | ICD-10-CM

## 2023-10-08 DIAGNOSIS — R110 Nausea: Secondary | ICD-10-CM | POA: Diagnosis not present

## 2023-10-08 DIAGNOSIS — J9622 Acute and chronic respiratory failure with hypercapnia: Secondary | ICD-10-CM | POA: Diagnosis not present

## 2023-10-08 DIAGNOSIS — I5043 Acute on chronic combined systolic (congestive) and diastolic (congestive) heart failure: Secondary | ICD-10-CM | POA: Diagnosis not present

## 2023-10-08 DIAGNOSIS — J9621 Acute and chronic respiratory failure with hypoxia: Secondary | ICD-10-CM | POA: Diagnosis not present

## 2023-10-08 DIAGNOSIS — M5410 Radiculopathy, site unspecified: Secondary | ICD-10-CM | POA: Insufficient documentation

## 2023-10-08 DIAGNOSIS — D649 Anemia, unspecified: Secondary | ICD-10-CM

## 2023-10-08 DIAGNOSIS — E1142 Type 2 diabetes mellitus with diabetic polyneuropathy: Secondary | ICD-10-CM | POA: Diagnosis present

## 2023-10-08 DIAGNOSIS — J188 Other pneumonia, unspecified organism: Secondary | ICD-10-CM | POA: Diagnosis not present

## 2023-10-08 DIAGNOSIS — M25552 Pain in left hip: Secondary | ICD-10-CM | POA: Diagnosis present

## 2023-10-08 DIAGNOSIS — J441 Chronic obstructive pulmonary disease with (acute) exacerbation: Secondary | ICD-10-CM | POA: Diagnosis not present

## 2023-10-08 MED FILL — Medication: INTRATHECAL | Qty: 1 | Status: AC

## 2023-10-08 NOTE — Progress Notes (Signed)
 PROVIDER NOTE: Interpretation of information contained herein should be left to medically-trained personnel. Specific patient instructions are provided elsewhere under "Patient Instructions" section of medical record. This document was created in part using STT-dictation technology, any transcriptional errors that may result from this process are unintentional.  Patient: Jordan Chan Type: Established DOB: Apr 08, 1951 MRN: [id number] PCP: Marjie Skiff, NP  Service: Procedure DOS: 10/08/2023 Setting: Ambulatory Location: Ambulatory outpatient facility Delivery: Face-to-face Provider: Oswaldo Done, MD Specialty: Interventional Pain Management Specialty designation: 09 Location: Outpatient facility Ref. Prov.: Marjie Skiff, NP       Interventional Therapy   Primary Reason for Visit: Interventional Pain Management Treatment. CC: Back Pain  Procedure:          Type: Management of Intrathecal Drug Delivery System (IDDS) - Reservoir Refill (95621). No rate change.  Indications: 1. Chronic pain syndrome   2. Chronic low back pain (1ry area of Pain) (Bilateral) (R>L) w/o sciatica   3. Chronic lower extremity pain (2ry area of Pain) (Bilateral) (R>L)   4. Chronic radicular pain of lower extremity   5. Chronic hip pain (Left)   6. DDD (degenerative disc disease), lumbosacral w/ LBP & LEP   7. Diabetic peripheral neuropathy (HCC)   8. Abnormal MRI, lumbar spine (08/26/2019)   9. Presence of intrathecal pump   10. Encounter for adjustment and management of infusion pump    Pain Assessment: Self-Reported Pain Score: 6 /10             Reported level is compatible with observation.        Discussed the use of AI scribe software for clinical note transcription with the patient, who gave verbal consent to proceed.  History of Present Illness   Jordan Chan is a 73 year old female who presents for an intracranial pump refill.  She is currently undergoing an  intracranial pump refill with umepivacaine 20 mg/mL, clonidine 100 mcg/mL, and morphine 5 mg/mL. The procedure is being conducted without any complications.  She experienced a recent severe health episode involving heart issues, blood clots, and pneumonia in both lungs, leading to respiratory distress or failure. This episode significantly impacted her memory and consciousness, as she does not recall calling the rescue squad or the initial days of her hospital stay.  She has a history of blood clots despite being on blood thinners. Her pulmonologist found this puzzling, as anticoagulation therapy is expected to prevent clot formation. The specific blood thinner regimen was not detailed.  She had pneumonia in both lungs, contributing to respiratory distress or failure. This was a severe condition that required hospitalization, and she does not recall the initial days of the hospital stay, indicating the severity of the illness.  She experienced respiratory distress or failure, likely secondary to pneumonia and possibly exacerbated by blood clots. This condition was severe enough to require emergency medical intervention and hospitalization.         IDDS (Intrathecal Drug Delivery System):   Device:  Manufacturer: Medtronic Synchromed II Model: [id number] Serial No.: [id number] H Type: Programmable  Volume: 40 mL reservoir Catheter Placement: Intrathecal Catheter Level: TBD MRI compatibility: Yes   Implant Details:  Implant Date: 05/27/2017 Implanter: Cannon Kettle, MD [id number] Delivery Route: Intrathecal Site: Abdominal Laterality:  TBD   Content:  1ry Medication Class: Opioid 1ry Medication (Concentration): PF-Morphine (5.0 mg/mL) 2ry Medication (Concentration): PF-Bupivacaine (20.0 mg/mL) 3ry Medication (Concentration): PF-Clonidine (100.0 mcg/mL)  PTM parameters:  Mode: Off/Inactive Bolus Dose: N/A Duration: N/A (min) Max.  No.: N/A (bolus/day). Lock-out time  (interval): N/A (hrs) Max. Daily bolus dose: N/A (mg/day) Total max. Daily dose (basal + bolus): N/A (mg/day)  Programming:  Type: Simple continuous.  Medication, Concentration, Infusion Program, & Delivery Rate: For up-to-date details please see most recent scanned programming printout.   Changes:  Medication Change: None at this point Rate Change: No change in rate  Reported side-effects or adverse reactions: None reported  Effectiveness: Described as relatively effective, allowing for increase in activities of daily living (ADL) Clinically meaningful improvement in function (CMIF): Sustained CMIF goals met  Plan: Pump refill today   Pharmacotherapy Assessment  Opioid Analgesic: morphine sulfate powder (for intrathecal pump use)  MME/day: 2.5 mg/day (Intrathecal)   Monitoring: [place] PMP: PDMP reviewed during this encounter.       Pharmacotherapy: No side-effects or adverse reactions reported. Compliance: No problems identified. Effectiveness: Clinically acceptable. Plan: Refer to "POC". UDS:  Summary  Date Value Ref Range Status  06/17/2023 Note  Final    Comment:    ==================================================================== Compliance Drug Analysis, Ur ==================================================================== Test                             Result       Flag       Units  Drug Present and Declared for Prescription Verification   Gabapentin                     PRESENT      EXPECTED   Venlafaxine                    PRESENT      EXPECTED   Desmethylvenlafaxine           PRESENT      EXPECTED    Desmethylvenlafaxine is an expected metabolite of venlafaxine.    Metoprolol                     PRESENT      EXPECTED  Drug Present not Declared for Prescription Verification   Morphine                       1931         UNEXPECTED ng/mg creat    Potential sources of morphine include administration of codeine or    morphine, use of heroin, or ingestion of  poppy seeds.  Drug Absent but Declared for Prescription Verification   Hydrocodone                    Not Detected UNEXPECTED ng/mg creat   Cyclobenzaprine                Not Detected UNEXPECTED   Acetaminophen                  Not Detected UNEXPECTED    Acetaminophen, as indicated in the declared medication list, is not    always detected even when used as directed.  ==================================================================== Test                      Result    Flag   Units      Ref Range   Creatinine              97               mg/dL      >=  20 ==================================================================== Declared Medications:  The flagging and interpretation on this report are based on the  following declared medications.  Unexpected results may arise from  inaccuracies in the declared medications.   **Note: The testing scope of this panel includes these medications:   Cyclobenzaprine (Flexeril)  Gabapentin  Hydrocodone (Norco)  Metoprolol (Toprol)  Venlafaxine (Effexor)   **Note: The testing scope of this panel does not include small to  moderate amounts of these reported medications:   Acetaminophen (Norco)   **Note: The testing scope of this panel does not include the  following reported medications:   Albuterol  Apixaban (Eliquis)  Atorvastatin (Lipitor)  Buspirone (Buspar)  Dulaglutide (Trulicity)  Fluticasone (Trelegy)  Folic Acid  Furosemide (Lasix)  Insulin Evaristo Bury)  Losartan (Cozaar)  Metformin  Montelukast  Ondansetron (Zofran)  Pantoprazole (Protonix)  Umeclidinium (Trelegy)  Vilanterol (Trelegy)  Vitamin B12  Vitamin D3 ==================================================================== For clinical consultation, please call [phone]. ====================================================================    No results found for: "CBDTHCR", "D8THCCBX", "D9THCCBX"   H&P (Pre-op Assessment):  Ms. Maxfield is a 73 y.o.  (year old), female patient, seen today for interventional treatment. She  has a past surgical history that includes Abdominal hysterectomy (1987); Elbow arthroscopy with tendon reconstruction; morphine pump (2011); Plantar fascia release; Esophagogastroduodenoscopy (egd) with propofol (N/A, 01/01/2017); Colonoscopy with propofol (N/A, 01/01/2017); LEFT HEART CATH AND CORONARY ANGIOGRAPHY (N/A, 04/28/2017); Mastectomy (Right, 06/1997); Back surgery; Breast surgery (Right); Cardiac catheterization; Trigger finger release; Cataract extraction w/PHACO (Right, 05/19/2019); Intrathecal pump implant; Coccyx removal; Cataract extraction w/PHACO (Left, 06/16/2019); Colonoscopy with propofol (N/A, 12/11/2021); Esophagogastroduodenoscopy (egd) with propofol (N/A, 12/11/2021); and Total hip arthroplasty (Left). Ms. Mapel has a current medication list which includes the following prescription(s): accu-chek aviva plus, accu-chek softclix lancets, albuterol, albuterol, alum & mag hydroxide-simeth, apixaban, atorvastatin, true metrix meter, buspirone, cholecalciferol, freestyle libre 2 reader, freestyle libre 2 sensor, cyanocobalamin, cyclobenzaprine, dapagliflozin propanediol, docusate sodium, feeding supplement, iron, ferrous sulfate, folic acid, furosemide, gabapentin, insulin lispro, losartan, metformin, metoprolol tartrate, montelukast, multivitamin with minerals, naloxone, easy air compressor nebulizer, ondansetron, oxygen-helium, PAIN MANAGEMENT INTRATHECAL, IT, PUMP, pantoprazole, polyethylene glycol, trelegy ellipta, tresiba flextouch, trulicity, and venlafaxine xr. Her primarily concern today is the Back Pain  Initial Vital Signs:  Pulse/HCG Rate: (!) 111  Temp: (!) 96.4 F (35.8 C) Resp: 18 BP: 127/65 SpO2: 99 %  BMI: Estimated body mass index is 27.59 kg/m as calculated from the following:   Height as of this encounter: 4\' 10"  (1.473 m).   Weight as of this encounter: 132 lb (59.9 kg).  Risk  Assessment: Allergies: Reviewed. She is allergic to other, pain patch [menthol], jardiance [empagliflozin], avelox [moxifloxacin hcl in nacl], doxycycline, erythromycin, fentanyl, moxifloxacin hcl, oxycontin [oxycodone], and ozempic [semaglutide].  Allergy Precautions: None required Coagulopathies: Reviewed. None identified.  Blood-thinner therapy: None at this time Active Infection(s): Reviewed. None identified. Ms. Meneely is afebrile  Site Confirmation: Ms. Hefel was asked to confirm the procedure and laterality before marking the site Procedure checklist: Completed Consent: Before the procedure and under the influence of no sedative(s), amnesic(s), or anxiolytics, the patient was informed of the treatment options, risks and possible complications. To fulfill our ethical and legal obligations, as recommended by the American Medical Association's Code of Ethics, I have informed the patient of my clinical impression; the nature and purpose of the treatment or procedure; the risks, benefits, and possible complications of the intervention; the alternatives, including doing nothing; the risk(s) and benefit(s) of the alternative treatment(s) or procedure(s); and the risk(s) and benefit(s)  of doing nothing.  Ms. Mumaw was provided with information about the general risks and possible complications associated with most interventional procedures. These include, but are not limited to: failure to achieve desired goals, infection, bleeding, organ or nerve damage, allergic reactions, paralysis, and/or death.  In addition, she was informed of those risks and possible complications associated to this particular procedure, which include, but are not limited to: damage to the implant; failure to decrease pain; local, systemic, or serious CNS infections, intraspinal abscess with possible cord compression and paralysis, or life-threatening such as meningitis; bleeding; organ damage; nerve injury or damage with  subsequent sensory, motor, and/or autonomic system dysfunction, resulting in transient or permanent pain, numbness, and/or weakness of one or several areas of the body; allergic reactions, either minor or major life-threatening, such as anaphylactic or anaphylactoid reactions.  Furthermore, Ms. Rudy was informed of those risks and complications associated with the medications. These include, but are not limited to: allergic reactions (i.e.: anaphylactic or anaphylactoid reactions); endorphine suppression; bradycardia and/or hypotension; water retention and/or peripheral vascular relaxation leading to lower extremity edema and possible stasis ulcers; respiratory depression and/or shortness of breath; decreased metabolic rate leading to weight gain; swelling or edema; medication-induced neural toxicity; particulate matter embolism and blood vessel occlusion with resultant organ, and/or nervous system infarction; and/or intrathecal granuloma formation with possible spinal cord compression and permanent paralysis.  Before refilling the pump Ms. Ruest was informed that some of the medications used in the devise may not be FDA approved for such use and therefore it constitutes an off-label use of the medications.  Finally, she was informed that Medicine is not an exact science; therefore, there is also the possibility of unforeseen or unpredictable risks and/or possible complications that may result in a catastrophic outcome. The patient indicated having understood very clearly. We have given the patient no guarantees and we have made no promises. Enough time was given to the patient to ask questions, all of which were answered to the patient's satisfaction. Ms. Shader has indicated that she wanted to continue with the procedure. Attestation: I, the ordering provider, attest that I have discussed with the patient the benefits, risks, side-effects, alternatives, likelihood of achieving goals, and potential  problems during recovery for the procedure that I have provided informed consent. Date  Time: 10/08/2023 12:49 PM  Pre-Procedure Preparation:  Monitoring: As per clinic protocol. Respiration, ETCO2, SpO2, BP, heart rate and rhythm monitor placed and checked for adequate function Safety Precautions: Patient was assessed for positional comfort and pressure points before starting the procedure. Time-out: I initiated and conducted the "Time-out" before starting the procedure, as per protocol. The patient was asked to participate by confirming the accuracy of the "Time Out" information. Verification of the correct person, site, and procedure were performed and confirmed by me, the nursing staff, and the patient. "Time-out" conducted as per Joint Commission's Universal Protocol (UP.01.01.01). Time: 1312 Start Time: 1313 hrs.  Description of Procedure:          Position: Supine Target Area: Central-port of intrathecal pump. Approach: Anterior, 90 degree angle approach. Area Prepped: Entire Area around the pump implant. ChloraPrep (2% chlorhexidine gluconate and 70% isopropyl alcohol) Safety Precautions: Aspiration looking for blood return was conducted prior to all injections. At no point did we inject any substances, as a needle was being advanced. No attempts were made at seeking any paresthesias. Safe injection practices and needle disposal techniques used. Medications properly checked for expiration dates. SDV (single dose vial) medications used.  Description of the Procedure: Protocol guidelines were followed. Two nurses trained to do implant refills were present during the entire procedure. The refill medication was checked by both healthcare providers as well as the patient. The patient was included in the "Time-out" to verify the medication. The patient was placed in position. The pump was identified. The area was prepped in the usual manner. The sterile template was positioned over the pump, making  sure the side-port location matched that of the pump. Both, the pump and the template were held for stability. The needle provided in the Medtronic Kit was then introduced thru the center of the template and into the central port. The pump content was aspirated and discarded volume documented. The new medication was slowly infused into the pump, thru the filter, making sure to avoid overpressure of the device. The needle was then removed and the area cleansed, making sure to leave some of the prepping solution back to take advantage of its long term bactericidal properties. The pump was interrogated and programmed to reflect the correct medication, volume, and dosage. The program was printed and taken to the physician for approval. Once checked and signed by the physician, a copy was provided to the patient and another scanned into the EMR.  Vitals:   10/08/23 1248 10/08/23 1249  BP:  127/65  Pulse:  (!) 111  Resp:  18  Temp: (!) 96.4 F (35.8 C)   TempSrc: Temporal   SpO2:  99%  Weight:  132 lb (59.9 kg)  Height:  4\' 10"  (1.473 m)    Start Time: 1313 hrs. End Time:   hrs. Materials & Medications: Medtronic Refill Kit Medication(s): Please see chart orders for details.  Type of Imaging Technique: None used Indication(s): N/A Exposure Time: No patient exposure Contrast: None used. Fluoroscopic Guidance: N/A Ultrasound Guidance: N/A Interpretation: N/A  Antibiotic Prophylaxis:   Anti-infectives (From admission, onward)    None      Indication(s): None identified  Post-operative Assessment:  Post-procedure Vital Signs:  Pulse/HCG Rate: (!) 111  Temp: (!) 96.4 F (35.8 C) Resp: 18 BP: 127/65 SpO2: 99 %  EBL: None  Complications: No immediate post-treatment complications observed by team, or reported by patient.  Note: The patient tolerated the entire procedure well. A repeat set of vitals were taken after the procedure and the patient was kept under observation following  institutional policy, for this type of procedure. Post-procedural neurological assessment was performed, showing return to baseline, prior to discharge. The patient was provided with post-procedure discharge instructions, including a section on how to identify potential problems. Should any problems arise concerning this procedure, the patient was given instructions to immediately contact us, at any time, without hesitation. In any case, we plan to contact the patient by telephone for a follow-up status report regarding this interventional procedure.  Comments:  No additional relevant information.  Plan of Care (POC)  Assessment and Plan    Intrathecal pump refill   She is undergoing an intrathecal pump refill with bupivacaine, clonidine, and morphine. The procedure is conducted with care to prevent precipitate or cracks in the medication, ensuring safety and efficacy. Proceed with the refill using bupivacaine 20 mg/mL, clonidine 100 mcg/mL, and morphine 5 mg/mL.  Blood clots   She has experienced blood clots despite anticoagulation therapy, suggesting potential ineffectiveness of treatment or an underlying predisposition to thrombosis.  Pneumonia   Bilateral pneumonia has led to respiratory distress or failure, necessitating hospitalization. The severity is indicated by her lack  of recall of the initial hospital days.      Orders:  Orders Placed This Encounter  Procedures   PUMP REFILL    Maintain Protocol by having two(2) healthcare providers during procedure and programming.    Scheduling Instructions:     Please refill intrathecal pump today.    Where will this procedure be performed?:   [facility]   PUMP REFILL    Whenever possible schedule on a procedure today.    Standing Status:   Future    Expiration Date:   02/07/2024    Scheduling Instructions:     Please schedule intrathecal pump refill based on pump programming. Avoid schedule intervals of more than 120 days (4  months).    Where will this procedure be performed?:   [facility]   Remove and insert drug implant    Standing Status:   Future    Expiration Date:   04/09/2024    Scheduling Instructions:     Please schedule an intrathecal pump implant in same day surgery. Call the Medtr[employer] representative and schedule them to be available in the OR for procedure.   Informed Consent Details: Physician/Practitioner Attestation; Transcribe to consent form and obtain patient signature    Transcribe to consent form and obtain patient signature.    Physician/Practitioner attestation of informed consent for procedure/surgical case:   I, the physician/practitioner, attest that I have discussed with the patient the benefits, risks, side effects, alternatives, likelihood of achieving goals and potential problems during recovery for the procedure that I have provided informed consent.    Procedure:   Intrathecal pump refill    Physician/Practitioner performing the procedure:   Attending Physician: Alanta Scobey A. Laban Emperor, MD & designated trained staff    Indication/Reason:   Chronic Pain Syndrome (G89.4), presence of an intrathecal pump (Z97.8)   Chronic Opioid Analgesic:  morphine sulfate powder (for intrathecal pump use)  MME/day: 2.5 mg/day (Intrathecal)   Medications ordered for procedure: No orders of the defined types were placed in this encounter.  Medications administered: [name] had no medications administered during this visit.  See the medical record for exact dosing, route, and time of administration.  Follow-up plan:   Return for Pump Refill (Max:110mo).       Interventional Therapies  Risk Factors  Considerations  Medical Comorbidities:  Eliquis Anticoagulation: (Stop: 3 days  Restart: 6 hours)  A-Fib  Hx of PE  CHF w/ Reduced EF  Cardiomyopathy  Stage 3a CKD  COPD on O2  GERD  T2IDDM  OSA     Planned  Pending:   Revision and replacement of intrathecal pump  reservoir secondary to battery depletion. (Before July 2025)    Under consideration:   Revision and replacement of intrathecal pump reservoir secondary to battery depletion. (Before July 2025)  Intrathecal pump refill and management.   Completed:   Surgery: Intrathecal pump implant #1 (*) by Cannon Kettle, MD    Therapeutic  Palliative (PRN) options:   None established   Completed by other providers:   Surgery: Intrathecal pump replacement (05/27/2017) by Craige Cotta, MD ([facility])     Recent Visits Date Type Provider Dept  08/04/23 Procedure visit Delano Metz, MD [facility]  Showing recent visits within past 90 days and meeting all other requirements Today's Visits Date Type Provider Dept  10/08/23 Procedure visit Delano Metz, MD [facility]  Showing today's visits and meeting all other requirements Future Appointments Date Type Provider  Dept  12/15/23 Appointment Delano Metz, MD [facility]  Showing future appointments within next 90 days and meeting all other requirements  Disposition: Discharge home  Discharge (Date  Time): 10/08/2023;   hrs.   Primary Care Physician: Marjie Skiff, NP Location: [facility] Note by: Oswaldo Done, MD (TTS technology used. I apologize for any typographical errors that were not detected and corrected.) Date: 10/08/2023; Time: 1:51 PM  Disclaimer:  Medicine is not an Visual merchandiser. The only guarantee in medicine is that nothing is guaranteed. It is important to note that the decision to proceed with this intervention was based on the information collected from the patient. The Data and conclusions were drawn from the patient's questionnaire, the interview, and the physical examination. Because the information was provided in large part by the patient, it cannot be guaranteed that it has not been purposely or unconsciously  manipulated. Every effort has been made to obtain as much relevant data as possible for this evaluation. It is important to note that the conclusions that lead to this procedure are derived in large part from the available data. Always take into account that the treatment will also be dependent on availability of resources and existing treatment guidelines, considered by other Pain Management Practitioners as being common knowledge and practice, at the time of the intervention. For Medico-Legal purposes, it is also important to point out that variation in procedural techniques and pharmacological choices are the acceptable norm. The indications, contraindications, technique, and results of the above procedure should only be interpreted and judged by a Board-Certified Interventional Pain Specialist with extensive familiarity and expertise in the same exact procedure and technique.

## 2023-10-08 NOTE — Progress Notes (Signed)
 Safety precautions to be maintained throughout the outpatient stay will include: orient to surroundings, keep bed in low position, maintain call bell within reach at all times, provide assistance with transfer out of bed and ambulation.

## 2023-10-08 NOTE — Patient Instructions (Signed)
 For Constipation Try: Samples of Linzess 72 mcg QD for 1 week,  then 145 mcg QD for 1 week,  then 290 mcg QD for 1 week.   Please let me know which dose works best, and then we can call in a prescription.

## 2023-10-08 NOTE — Patient Instructions (Signed)
 Opioid Overdose Opioids are drugs that are often used to treat pain. Opioids include illegal drugs, such as heroin, as well as prescription pain medicines, such as codeine, morphine, hydrocodone, and fentanyl. An opioid overdose happens when you take too much of an opioid. An overdose may be intentional or accidental and can happen with any type of opioid. The effects of an overdose can be mild, dangerous, or even deadly. Opioid overdose is a medical emergency. What are the causes? This condition may be caused by: Taking too much of an opioid on purpose. Taking too much of an opioid by accident. Using two or more substances that contain opioids at the same time. Taking an opioid with a substance that affects your heart, breathing, or blood pressure. These include alcohol, tranquilizers, sleeping pills, illegal drugs, and some over-the-counter medicines. This condition may also happen due to an error made by: A health care provider who prescribes a medicine. The pharmacist who fills the prescription. What increases the risk? This condition is more likely in: Children. They may be attracted to colorful pills. Because of a child's small size, even a small amount of a medicine can be dangerous. Older people. They may be taking many different medicines. Older people may have difficulty reading labels or remembering when they last took their medicines. They may also be more sensitive to the effects of opioids. People with chronic medical conditions, especially heart, liver, kidney, or neurological diseases. People who take an opioid for a long period of time. People who take opioids and use illegal drugs, such as heroin, or other substances, such as alcohol. People who: Have a history of drug or alcohol abuse. Have certain mental health conditions. Have a history of previous drug overdoses. People who take opioids that are not prescribed for them. What are the signs or symptoms? Symptoms of this  condition depend on the type of opioid and the amount that was taken. Common symptoms include: Sleepiness or difficulty waking from sleep. Confusion. Slurred speech. Slowed breathing and a slow pulse (bradycardia). Nausea and vomiting. Abnormally small pupils. Signs and symptoms that require emergency treatment include: Cold, clammy, and pale skin. Blue lips and fingernails. Vomiting. Gurgling sounds in the throat. A pulse that is very slow or difficult to detect. Breathing that is very irregular, slow, noisy, or difficult to detect. Inability to respond to speech or be awakened from sleep (stupor). Seizures. How is this diagnosed? This condition is diagnosed based on your symptoms and medical history. It is important to tell your health care provider: About all of the opioids that you took. When you took the opioids. Whether you were drinking alcohol or using marijuana, cocaine, or other drugs. Your health care provider will do a physical exam. This exam may include: Checking and monitoring your heart rate and rhythm, breathing rate, temperature, and blood pressure. Measuring oxygen levels in your blood. Checking for abnormally small pupils. You may also have blood tests or urine tests. You may have X-rays if you are having severe breathing problems. How is this treated? This condition requires immediate medical treatment and hospitalization. Reversing the effects of the opioid is the first step in treatment. If you have a Narcan kit or naloxone, use it right away. Follow your health care provider's instructions. A friend or family member can also help you with this. The rest of your treatment will be given in the hospital intensive care (ICU). Treatment in the hospital may include: Giving salts and minerals (electrolytes) along with fluids through  an IV. Inserting a breathing tube (endotracheal tube) in your airway to help you breathe if you cannot breathe on your own or you are in  danger of not being able to breathe on your own. Giving oxygen through a small tube under your nose. Passing a tube through your nose and into your stomach (nasogastric tube, or NG tube) to empty your stomach. Giving medicines that: Increase your blood pressure. Relieve nausea and vomiting. Relieve abdominal pain and cramping. Reverse the effects of the opioid (naloxone). Monitoring your heart and oxygen levels. Ongoing counseling and mental health support if you intentionally overdosed or used an illegal drug. Follow these instructions at home:  Medicines Take over-the-counter and prescription medicines only as told by your health care provider. Always ask your health care provider about possible side effects and interactions of any new medicine that you start taking. Keep a list of all the medicines that you take, including over-the-counter medicines. Bring this list with you to all your medical visits. General instructions Drink enough fluid to keep your urine pale yellow. Keep all follow-up visits. This is important. How is this prevented? Read the drug inserts that come with your opioid pain medicines. Take medicines only as told by your health care provider. Do not take more medicine than you are told. Do not take medicines more frequently than you are told. Do not drink alcohol or take sedatives when taking opioids. Do not use illegal or recreational drugs, including cocaine, ecstasy, and marijuana. Do not take opioid medicines that are not prescribed for you. Store all medicines in safety containers that are out of the reach of children. Get help if you are struggling with: Alcohol or drug use. Depression or another mental health problem. Thoughts of hurting yourself or another person. Keep the phone number of your local poison control center near your phone or in your mobile phone. In the [place] the hotline of the [facility] is 573 287 8891. If you were  prescribed naloxone, make sure you understand how to take it. Contact a health care provider if: You need help understanding how to take your pain medicines. You feel your medicines are too strong. You are concerned that your pain medicines are not working well for your pain. You develop new symptoms or side effects when you are taking medicines. Get help right away if: You or someone else is having symptoms of an opioid overdose. Get help even if you are not sure. You have thoughts about hurting yourself or others. You have: Chest pain. Difficulty breathing. A loss of consciousness. These symptoms may represent a serious problem that is an emergency. Do not wait to see if the symptoms will go away. Get medical help right away. Call your local emergency services (911 in the [place] Do not drive yourself to the hospital. If you ever feel like you may hurt yourself or others, or have thoughts about taking your own life, get help right away. You can go to your nearest emergency department or: Call your local emergency services (911 in the [place] Call a suicide crisis helpline, such as the National Suicide Prevention Lifeline at 410-283-8438 or 988 in the [place] This is open 24 hours a day in the [place] If you're a Veteran: Call 988 and press 1. This is open 24 hours a day. Text the [employer] at 765 788 0138. Summary Opioids are drugs that are often used to treat pain. Opioids include illegal drugs, such as heroin, as well as prescription  pain medicines. An opioid overdose happens when you take too much of an opioid. Overdoses can be intentional or accidental. Opioid overdose is very dangerous. It is a life-threatening emergency. If you or someone you know is experiencing an opioid overdose, get help right away. This information is not intended to replace advice given to you by your health care provider. Make sure you discuss any questions you have with your health care provider. Document  Revised: 04/27/2023 Document Reviewed: 10/31/2020 Elsevier Patient Education  2024 [employer].

## 2023-10-09 ENCOUNTER — Telehealth: Payer: Self-pay | Admitting: *Deleted

## 2023-10-09 NOTE — Telephone Encounter (Signed)
 Post procedure call;  patient reports that she is doing well.

## 2023-10-13 DIAGNOSIS — J9621 Acute and chronic respiratory failure with hypoxia: Secondary | ICD-10-CM | POA: Diagnosis not present

## 2023-10-13 DIAGNOSIS — I5043 Acute on chronic combined systolic (congestive) and diastolic (congestive) heart failure: Secondary | ICD-10-CM | POA: Diagnosis not present

## 2023-10-13 DIAGNOSIS — J441 Chronic obstructive pulmonary disease with (acute) exacerbation: Secondary | ICD-10-CM | POA: Diagnosis not present

## 2023-10-13 DIAGNOSIS — A419 Sepsis, unspecified organism: Secondary | ICD-10-CM | POA: Diagnosis not present

## 2023-10-13 DIAGNOSIS — J9622 Acute and chronic respiratory failure with hypercapnia: Secondary | ICD-10-CM | POA: Diagnosis not present

## 2023-10-13 DIAGNOSIS — I4819 Other persistent atrial fibrillation: Secondary | ICD-10-CM | POA: Diagnosis not present

## 2023-10-13 DIAGNOSIS — J188 Other pneumonia, unspecified organism: Secondary | ICD-10-CM | POA: Diagnosis not present

## 2023-10-13 DIAGNOSIS — J440 Chronic obstructive pulmonary disease with acute lower respiratory infection: Secondary | ICD-10-CM | POA: Diagnosis not present

## 2023-10-13 DIAGNOSIS — I130 Hypertensive heart and chronic kidney disease with heart failure and stage 1 through stage 4 chronic kidney disease, or unspecified chronic kidney disease: Secondary | ICD-10-CM | POA: Diagnosis not present

## 2023-10-15 DIAGNOSIS — J9622 Acute and chronic respiratory failure with hypercapnia: Secondary | ICD-10-CM | POA: Diagnosis not present

## 2023-10-15 DIAGNOSIS — I130 Hypertensive heart and chronic kidney disease with heart failure and stage 1 through stage 4 chronic kidney disease, or unspecified chronic kidney disease: Secondary | ICD-10-CM | POA: Diagnosis not present

## 2023-10-15 DIAGNOSIS — J9621 Acute and chronic respiratory failure with hypoxia: Secondary | ICD-10-CM | POA: Diagnosis not present

## 2023-10-15 DIAGNOSIS — J440 Chronic obstructive pulmonary disease with acute lower respiratory infection: Secondary | ICD-10-CM | POA: Diagnosis not present

## 2023-10-15 DIAGNOSIS — I5043 Acute on chronic combined systolic (congestive) and diastolic (congestive) heart failure: Secondary | ICD-10-CM | POA: Diagnosis not present

## 2023-10-15 DIAGNOSIS — J441 Chronic obstructive pulmonary disease with (acute) exacerbation: Secondary | ICD-10-CM | POA: Diagnosis not present

## 2023-10-15 DIAGNOSIS — J188 Other pneumonia, unspecified organism: Secondary | ICD-10-CM | POA: Diagnosis not present

## 2023-10-15 DIAGNOSIS — A419 Sepsis, unspecified organism: Secondary | ICD-10-CM | POA: Diagnosis not present

## 2023-10-15 DIAGNOSIS — I4819 Other persistent atrial fibrillation: Secondary | ICD-10-CM | POA: Diagnosis not present

## 2023-10-16 ENCOUNTER — Ambulatory Visit: Payer: Self-pay | Admitting: *Deleted

## 2023-10-16 NOTE — Patient Outreach (Signed)
 Care Coordination   Follow Up Visit Note   10/16/2023 Name: Jordan Chan MRN: [id number] DOB: [birth year]  Jordan Chan is a 72 y.o. year old female who sees Aura Dials T, NP for primary care. I spoke with  Jordan Chan by phone today.  What matters to the patients health and wellness today?  State she is doing well, still waiting for call from [employer] regarding portable oxygen concentrator.  State this was sent over to DME company after last visit, but when she called to follow up, [employer] told her they did not have the order, which was sent again from MD office. This RNCM will follow up.  Otherwise, she feels like she is still make slow progress since hospital discharge.     Goals Addressed             This Visit's Progress    Management of chronic medical conditions   On track    Interventions Today    Flowsheet Row Most Recent Value  Chronic Disease   Chronic disease during today's visit Chronic Obstructive Pulmonary Disease (COPD), Congestive Heart Failure (CHF)  General Interventions   General Interventions Discussed/Reviewed General Interventions Reviewed, Durable Medical Equipment (DME), Communication with, Doctor Visits  Doctor Visits Discussed/Reviewed Doctor Visits Reviewed, Specialist  [Upcoming with HF clinic on 3/21]  Durable Medical Equipment (DME) Oxygen, Other  [Wearing oxygen all the time now, state order was sent for POC. Lost nebulizer during move, will have husband look in storage for machine as insurance will not pay for a new one yet]  PCP/Specialist Visits Compliance with follow-up visit  Communication with --  [Called [employer] to follow up on order for POC, state they still do not have information.  Received fax number as [phone], called [facility] to provide information and request order/prescription be sent over]  Exercise Interventions   Exercise Discussed/Reviewed Weight Managment, Physical Activity  Physical Activity  Discussed/Reviewed Physical Activity Reviewed  [Continues to work with home health for PT, report increase in strength]  Weight Management Weight maintenance  [Weiging self daily, denies any swelling]  Education Interventions   Education Provided Provided Education  Provided Verbal Education On When to see the doctor, Medication, Other  [Monitoring oxygen saturations, range 94-96% with O2. Meds reviewed]                SDOH assessments and interventions completed:  No     Care Coordination Interventions:  Yes, provided   Follow up plan: Follow up call scheduled for 3/18    Encounter Outcome:  Patient Visit Completed   Rodney Langton, RN, MSN, CCM [place]  [facility], [facility] RN Care Coordinator Direct Dial: [phone] / Main [phone] Fax [phone] Email: [email or web address] Website: [email or web address]

## 2023-10-19 DIAGNOSIS — A419 Sepsis, unspecified organism: Secondary | ICD-10-CM | POA: Diagnosis not present

## 2023-10-19 DIAGNOSIS — I4819 Other persistent atrial fibrillation: Secondary | ICD-10-CM | POA: Diagnosis not present

## 2023-10-19 DIAGNOSIS — J9621 Acute and chronic respiratory failure with hypoxia: Secondary | ICD-10-CM | POA: Diagnosis not present

## 2023-10-19 DIAGNOSIS — I5043 Acute on chronic combined systolic (congestive) and diastolic (congestive) heart failure: Secondary | ICD-10-CM | POA: Diagnosis not present

## 2023-10-19 DIAGNOSIS — J440 Chronic obstructive pulmonary disease with acute lower respiratory infection: Secondary | ICD-10-CM | POA: Diagnosis not present

## 2023-10-19 DIAGNOSIS — J9622 Acute and chronic respiratory failure with hypercapnia: Secondary | ICD-10-CM | POA: Diagnosis not present

## 2023-10-19 DIAGNOSIS — I130 Hypertensive heart and chronic kidney disease with heart failure and stage 1 through stage 4 chronic kidney disease, or unspecified chronic kidney disease: Secondary | ICD-10-CM | POA: Diagnosis not present

## 2023-10-19 DIAGNOSIS — J188 Other pneumonia, unspecified organism: Secondary | ICD-10-CM | POA: Diagnosis not present

## 2023-10-19 DIAGNOSIS — J441 Chronic obstructive pulmonary disease with (acute) exacerbation: Secondary | ICD-10-CM | POA: Diagnosis not present

## 2023-10-20 ENCOUNTER — Ambulatory Visit: Payer: Self-pay | Admitting: *Deleted

## 2023-10-20 DIAGNOSIS — J441 Chronic obstructive pulmonary disease with (acute) exacerbation: Secondary | ICD-10-CM | POA: Diagnosis not present

## 2023-10-20 DIAGNOSIS — I5043 Acute on chronic combined systolic (congestive) and diastolic (congestive) heart failure: Secondary | ICD-10-CM | POA: Diagnosis not present

## 2023-10-20 DIAGNOSIS — J9621 Acute and chronic respiratory failure with hypoxia: Secondary | ICD-10-CM | POA: Diagnosis not present

## 2023-10-20 DIAGNOSIS — J188 Other pneumonia, unspecified organism: Secondary | ICD-10-CM | POA: Diagnosis not present

## 2023-10-20 DIAGNOSIS — J9622 Acute and chronic respiratory failure with hypercapnia: Secondary | ICD-10-CM | POA: Diagnosis not present

## 2023-10-20 DIAGNOSIS — J440 Chronic obstructive pulmonary disease with acute lower respiratory infection: Secondary | ICD-10-CM | POA: Diagnosis not present

## 2023-10-20 DIAGNOSIS — A419 Sepsis, unspecified organism: Secondary | ICD-10-CM | POA: Diagnosis not present

## 2023-10-20 DIAGNOSIS — I4819 Other persistent atrial fibrillation: Secondary | ICD-10-CM | POA: Diagnosis not present

## 2023-10-20 DIAGNOSIS — I130 Hypertensive heart and chronic kidney disease with heart failure and stage 1 through stage 4 chronic kidney disease, or unspecified chronic kidney disease: Secondary | ICD-10-CM | POA: Diagnosis not present

## 2023-10-20 NOTE — Patient Outreach (Signed)
 Care Coordination   Follow Up Visit Note   10/20/2023 Name: Jordan Chan MRN: [id number] DOB: Feb 03, 1951  Jordan Chan is a 73 y.o. year old female who sees Aura Dials T, NP for primary care. I spoke with  Jordan Chan by phone today.  What matters to the patients health and wellness today?  Patient doing well, advised that portable oxygen concentrator will be delivered on 3/20.  She expresses gratitude for the assistance provided with getting DME ordered.      Goals Addressed             This Visit's Progress    Management of chronic medical conditions   On track    Interventions Today    Flowsheet Row Most Recent Value  Chronic Disease   Chronic disease during today's visit Chronic Obstructive Pulmonary Disease (COPD)  General Interventions   General Interventions Discussed/Reviewed Durable Medical Equipment (DME), Communication with  Doctor Visits Discussed/Reviewed Doctor Visits Reviewed, Specialist  [upcoming with HF on 3/20]  Communication with --  [Called [employer] to confirm order for portable oxygen concentrator has been received]                SDOH assessments and interventions completed:  No     Care Coordination Interventions:  Yes, provided   Follow up plan: Follow up call scheduled for 4/15    Encounter Outcome:  Patient Visit Completed   Rodney Langton, RN, MSN, CCM [place]  [facility], [facility] RN Care Coordinator Direct Dial: [phone] / Main [phone] Fax [phone] Email: [email or web address] Website: [email or web address]

## 2023-10-21 ENCOUNTER — Telehealth: Payer: Self-pay | Admitting: Family

## 2023-10-21 DIAGNOSIS — J9621 Acute and chronic respiratory failure with hypoxia: Secondary | ICD-10-CM | POA: Diagnosis not present

## 2023-10-21 DIAGNOSIS — I4819 Other persistent atrial fibrillation: Secondary | ICD-10-CM | POA: Diagnosis not present

## 2023-10-21 DIAGNOSIS — J9622 Acute and chronic respiratory failure with hypercapnia: Secondary | ICD-10-CM | POA: Diagnosis not present

## 2023-10-21 DIAGNOSIS — I130 Hypertensive heart and chronic kidney disease with heart failure and stage 1 through stage 4 chronic kidney disease, or unspecified chronic kidney disease: Secondary | ICD-10-CM | POA: Diagnosis not present

## 2023-10-21 DIAGNOSIS — J440 Chronic obstructive pulmonary disease with acute lower respiratory infection: Secondary | ICD-10-CM | POA: Diagnosis not present

## 2023-10-21 DIAGNOSIS — J188 Other pneumonia, unspecified organism: Secondary | ICD-10-CM | POA: Diagnosis not present

## 2023-10-21 DIAGNOSIS — A419 Sepsis, unspecified organism: Secondary | ICD-10-CM | POA: Diagnosis not present

## 2023-10-21 DIAGNOSIS — J441 Chronic obstructive pulmonary disease with (acute) exacerbation: Secondary | ICD-10-CM | POA: Diagnosis not present

## 2023-10-21 DIAGNOSIS — I5043 Acute on chronic combined systolic (congestive) and diastolic (congestive) heart failure: Secondary | ICD-10-CM | POA: Diagnosis not present

## 2023-10-21 NOTE — Telephone Encounter (Signed)
 Pt confirmed appt 10/22/23

## 2023-10-21 NOTE — Progress Notes (Unsigned)
 [facility] Note     PCP: Marjie Skiff, NP (last seen 02/25) Cardiologist: Yvonne Kendall, MD (last seen 11/24)  Chief Complaint: shortness of breath  HPI:  Jordan Chan is a 73 y/o female with a history of B12 deficiency, chronic pain syndrome s/p intrathecal pump, paroxysmal atrial fibrillation, stress-induced cardiomyopathy in the setting of pain pump malfunction and opioid withdrawal (2018), HTN, hyperlipidemia, PE (2021), DM, COPD, GERD, OSA, breast cancer, chronic back pain,anemia and IBS.   Admitted 04/15/23 due to worsening of left hip pain at the surgical site associated with increasing redness about the left hip surgical wound. Tachycardic. WBC 17.6. CXR normal.Thought to be septic due to left hip surgical wound infection. Treated with IV fluids, analgesics and empiric IV antibiotics. Transferred to [facility].    Admitted 08/09/23 due to acute respiratory distress found with sats in the 50 to 60% on RA. Found to have multiple PE's and sepsis due to aspiration pneumonia. Initially intubated. Completed antibiotics & successfully extubated. Underwent swallowing evaluation. Heparin stopped and transitioned to eliquis. Anemia stable.   Echo 12/11/20: EF 45% with Grade II DD Echo 09/11/21: EF 25-30% with Grade II DD Echo 10/31/21: EF 40-45% with Grade I DD Echo 08/11/23: EF 25-30% with Grade I DD  She presents today, with her husband, for a HF follow-up visit with a chief complaint of moderate shortness of breath. Has associated fatigue (improving) and chronic difficulty sleeping. Denies chest pain, cough, palpitations, abdominal distention, dizziness or weight gain. OT walks her without her oxygen on and it drops to 87-89%         Wearing oxygen at 2L around the clock. Quit smoking 19 years ago when a grandchild was going to be born. Denies alcohol use. Not adding salt to her food. Weighing daily   Had a yeast infection and dry mouth with  jardiance but is willing to try farxiga.   Previous cardiac studies:  [facility] 04/28/17:  No angiographically significant coronary artery disease. Severely reduced LV contraction (LVEF 25-30%) with mid and apical akinesis, consistent with Takotsubo (stress-induced) cardiomyopathy. Mildly elevated left ventricular filling pressure.  ROS: All systems negative except what is listed in HPI, PMH and Problem List  Past Medical History:  Diagnosis Date   Arthritis    Asthma    Atrial fibrillation (HCC)    Breast cancer (HCC) 1998   right breast ca with mastectomy and chemotherapy and radiation   Bronchitis    CHF (congestive heart failure) (HCC)    "with Morphine withdrawal"   COPD (chronic obstructive pulmonary disease) (HCC)    Diabetes mellitus without complication (HCC)    Diverticulitis    diverticulosis also   Dyspnea    Endometriosis    GERD (gastroesophageal reflux disease)    History of shingles 2000-2005   Hypercholesteremia    Hypertension    IBS (irritable bowel syndrome)    Low back pain    a. Implanted morphine/bupivicaine/clonidine pump.   Neuropathy    Orthopnea    Oxygen dependent    uses at night   Personal history of chemotherapy    Personal history of radiation therapy    Pneumonia    pneumonia 5-6 times, history of bronchitis also   Scoliosis    Sleep apnea    does not use cpap   Stroke (HCC) 2010   TIA, 10 years ago   Withdrawal from sedative drug (HCC)    withdrawal from morphine when pump batteries died  Current Outpatient Medications  Medication Sig Dispense Refill   ACCU-CHEK AVIVA PLUS test strip TEST THREE TIMES DAILY 300 strip 3   Accu-Chek Softclix Lancets lancets TEST BLOOD SUGAR THREE TIMES DAILY 300 each 3   albuterol (PROVENTIL) (2.5 MG/3ML) 0.083% nebulizer solution Take 3 mLs (2.5 mg total) by nebulization every 4 (four) hours as needed for wheezing or shortness of breath. 75 mL 1   albuterol (VENTOLIN HFA) 108 (90 Base) MCG/ACT inhaler  Inhale 2 puffs into the lungs every 6 (six) hours as needed for wheezing or shortness of breath. 1 each 1   alum & mag hydroxide-simeth (MAALOX/MYLANTA) 200-200-20 MG/5ML suspension Take 30 mLs by mouth every 6 (six) hours as needed for indigestion or heartburn. 300 mL 1   apixaban (ELIQUIS) 5 MG TABS tablet Take 1 tablet (5 mg total) by mouth 2 (two) times daily. 60 tablet 0   atorvastatin (LIPITOR) 40 MG tablet Take 1 tablet (40 mg total) by mouth daily. 30 tablet 1   Blood Glucose Monitoring Suppl (TRUE METRIX METER) w/Device KIT Use to check blood sugar 4 times a day 1 kit 4   busPIRone (BUSPAR) 5 MG tablet Take 1 tablet (5 mg total) by mouth 2 (two) times daily. 30 tablet 1   cholecalciferol (VITAMIN D3) 25 MCG (1000 UNIT) tablet Take 1,000 Units by mouth daily.     Continuous Glucose Receiver (FREESTYLE LIBRE 2 READER) DEVI To check blood sugars 3-4 times daily due to insulin use. DX E11.40 2 each 5   Continuous Glucose Sensor (FREESTYLE LIBRE 2 SENSOR) MISC To check blood sugars 3-4 times daily due to insulin use. DX E11.40 2 each 5   Cyanocobalamin 1000 MCG/ML KIT Inject 1,000 mcg as directed every 30 (thirty) days.     cyclobenzaprine (FLEXERIL) 5 MG tablet Take by mouth.     dapagliflozin propanediol (FARXIGA) 10 MG TABS tablet Take 1 tablet (10 mg total) by mouth daily before breakfast.     docusate sodium (COLACE) 100 MG capsule Take 1 capsule (100 mg total) by mouth 2 (two) times daily as needed for mild constipation. 30 capsule 0   feeding supplement (ENSURE ENLIVE / ENSURE PLUS) LIQD Take 237 mLs by mouth 3 (three) times daily between meals. 237 mL 12   Ferrous Sulfate (IRON) 325 (65 Fe) MG TABS Take 1 tablet by mouth 2 (two) times daily.     ferrous sulfate 325 (65 FE) MG EC tablet Take 1 tablet (325 mg total) by mouth 2 (two) times daily. 60 tablet 1   folic acid (FOLVITE) 1 MG tablet TAKE 1 TABLET EVERY DAY 90 tablet 0   furosemide (LASIX) 40 MG tablet Take 0.5 tablets (20 mg  total) by mouth daily. 30 tablet 1   gabapentin (NEURONTIN) 400 MG capsule Take 1 capsule (400 mg total) by mouth 3 (three) times daily. 90 capsule 1   insulin lispro (HUMALOG KWIKPEN) 100 UNIT/ML KwikPen Inject 5 Units into the skin 3 (three) times daily before meals. Do not inject insulin if you are not going to eat meal.  Only take before a meal. 15 mL 3   losartan (COZAAR) 25 MG tablet Take 0.5 tablets (12.5 mg total) by mouth daily. 30 tablet 1   metFORMIN (GLUCOPHAGE) 1000 MG tablet Take 1 tablet (1,000 mg total) by mouth 2 (two) times daily with a meal. 60 tablet 1   metoprolol tartrate (LOPRESSOR) 25 MG tablet Take 0.5 tablets (12.5 mg total) by mouth 2 (two) times daily. 30  tablet 1   montelukast (SINGULAIR) 10 MG tablet Take 1 tablet (10 mg total) by mouth at bedtime. 30 tablet 0   Multiple Vitamin (MULTIVITAMIN WITH MINERALS) TABS tablet Take 1 tablet by mouth daily. 30 tablet 1   naloxone (NARCAN) nasal spray 4 mg/0.1 mL Place 1 spray into the nose as needed for up to 365 doses (for opioid-induced respiratory depresssion). In case of emergency (overdose), spray once into each nostril. If no response within 3 minutes, repeat application and call 911. 1 each 1   Nebulizers (EASY AIR COMPRESSOR NEBULIZER) MISC Use to inhaler nebulizer treatments at needed per instructions on nebulizer prescription 1 each 0   ondansetron (ZOFRAN) 4 MG tablet Take 1 tablet (4 mg total) by mouth daily as needed for nausea or vomiting. 30 tablet 1   OXYGEN Inhale 2 L into the lungs at bedtime.     PAIN MANAGEMENT INTRATHECAL, IT, PUMP 1 each by Intrathecal route. Intrathecal (IT) medication:  Morphine 5.0 mg/ml, Bupivacaine 20.0 mg/ml,  Clonidine 100.0 mcg/ml Patient does not remember current. Adjusted every 3 months at [facility]     pantoprazole (PROTONIX) 40 MG tablet Take 1 tablet (40 mg total) by mouth daily. 30 tablet 1   polyethylene glycol (MIRALAX / GLYCOLAX) 17 g packet Take 17 g by mouth  daily as needed for moderate constipation. 10 packet 0   TRELEGY ELLIPTA 100-62.5-25 MCG/ACT AEPB INHALE 1 PUFF INTO THE LUNGS DAILY . 42 DAY EXPIRATION AFTER OPENING FOIL TRAY 180 each 3   TRESIBA FLEXTOUCH 100 UNIT/ML FlexTouch Pen INJECT 55 UNITS SUBCUTANEOUSLY AT BEDTIME 30 mL 0   TRULICITY 4.5 MG/0.5ML SOPN INJECT 4.5MG  INTO THE SKIN ONCE WEEKLY AS DIRECTED 12 mL 0   venlafaxine XR (EFFEXOR-XR) 150 MG 24 hr capsule Take 1 capsule (150 mg total) by mouth daily. 30 capsule 0   No current facility-administered medications for this visit.    Allergies  Allergen Reactions   Other Palpitations    IV steroids   Pain Patch [Menthol] Anaphylaxis   Jardiance [Empagliflozin] Other (See Comments)    Yeast infection & dry mouth   Avelox [Moxifloxacin Hcl In Nacl] Other (See Comments)    Upset stomach   Doxycycline Diarrhea   Erythromycin Nausea Only and Other (See Comments)    Can take a Z-Pak just fine Other reaction(s): Other (See Comments) Can take Z-Pak  Can take a Z-Pak just fine   Fentanyl Nausea Only and Rash   Moxifloxacin Hcl Other (See Comments)    Upset stomach Upset stomach   Oxycontin [Oxycodone] Hives   Ozempic [Semaglutide] Nausea Only      Social History   Socioeconomic History   Marital status: Unknown    Spouse name: Harvie Heck   Number of children: 2   Years of education: Not on file   Highest education level: High school graduate  Occupational History   Occupation: retired  Tobacco Use   Smoking status: Former    Current packs/day: 0.00    Average packs/day: 1 pack/day for 30.0 years (30.0 ttl pk-yrs)    Types: Cigarettes    Start date: 04/30/1974    Quit date: 04/30/2004    Years since quitting: 19.4   Smokeless tobacco: Never  Vaping Use   Vaping status: Never Used  Substance and Sexual Activity   Alcohol use: No   Drug use: No   Sexual activity: Not Currently    Birth control/protection: Post-menopausal  Other Topics Concern   Not on file  Social  History Narrative   ** Merged History Encounter **       Social Drivers of Health   Financial Resource Strain: Low Risk  (10/07/2023)   Received from [facility]   Overall Financial Resource Strain (CARDIA)    Difficulty of Paying Living Expenses: Not hard at all  Food Insecurity: No Food Insecurity (10/07/2023)   Received from [facility]   Hunger Vital Sign    Worried About Running Out of Food in the Last Year: Never true    Ran Out of Food in the Last Year: Never true  Transportation Needs: No Transportation Needs (10/07/2023)   Received [facility]    In the past 12 months, has lack of transportation kept you from medical appointments or from getting medications?: No    Lack of Transportation (Non-Medical): No  Physical Activity: Inactive (12/23/2022)   Exercise Vital Sign    Days of Exercise per Week: 0 days    Minutes of Exercise per Session: 0 min  Stress: No Stress Concern Present (12/23/2022)   [employer] of Occupational Health - Occupational Stress Questionnaire    Feeling of Stress : Only a little  Social Connections: Socially Integrated (08/24/2023)   Social Connection and Isolation Panel [NHANES]    Frequency of Communication with Friends and Family: More than three times a week    Frequency of Social Gatherings with Friends and Family: More than three times a week    Attends Religious Services: More than 4 times per year    Active Member of [employer] or Organizations: Yes    Attends Engineer, structural: More than 4 times per year    Marital Status: Married  Catering manager Violence: Not At Risk (08/24/2023)   Humiliation, Afraid, Rape, and Kick questionnaire    Fear of Current or Ex-Partner: No    Emotionally Abused: No    Physically Abused: No    Sexually Abused: No      Family History  Problem Relation Age of Onset   Cancer Mother 48       lung   Thyroid disease  Mother    Cancer Father 22       Pancreatic   Hypertension Sister    Cancer Sister        "cancer on face"   Hyperlipidemia Sister    Osteoporosis Maternal Grandmother    Cancer Paternal Grandmother        colon   Arthritis Paternal Grandfather    Hyperlipidemia Son    Seizures Son    Breast cancer Neg Hx    There were no vitals filed for this visit.  Wt Readings from Last 3 Encounters:  10/08/23 132 lb (59.9 kg)  10/08/23 136 lb 6.4 oz (61.9 kg)  09/30/23 135 lb (61.2 kg)   Lab Results  Component Value Date   CREATININE 1.25 (H) 09/16/2023   CREATININE 1.29 (H) 09/04/2023   CREATININE 1.05 (H) 08/28/2023     PHYSICAL EXAM:  General: Well appearing. No resp difficulty HEENT: normal Neck: supple, no JVD Cor: Regular rhythm, rate. No rubs, gallops or murmurs Lungs: clear Abdomen: soft, nontender, nondistended. Extremities: no cyanosis, clubbing, rash, edema Neuro: alert & oriented X 3. Moves all 4 extremities w/o difficulty. Affect pleasant   ECG: not done   ASSESSMENT & PLAN:  1: NICM with reduced ejection fraction- - suspect due to stress induced after pain pump malfunction &  opioid withdrawal in 2018 - NYHA class III - euvolemic - weighing daily; reminded to call for overnight weight gain of > 2 pounds or weekly weight gain of > 5 pounds - weight stable from last visit here 1 month ago - Echo 12/11/20: EF 45% with Grade II DD - Echo 09/11/21: EF 25-30% with Grade II DD - Echo 10/31/21: EF 40-45% with Grade I DD - Echo 08/11/23: EF 25-30% with Grade I DD - had yeast infection/ dry mouth with previous jardiance but is agreeable to trying farxiga - begin farxiga 10mg  daily; if she develops yeast infection w/ this, it can be stopped - continue furosemide 20mg  daily; if notes increase in urination with starting farxiga, can change furosemide to PRN - continue losartan 12.5mg  daily; consider transitioning to entresto if BP maintains - continue metoprolol tartrate  12.5mg  BID - discussed adding spironolactone if BP maintains watching K+ closely as she's been both hypo/ hyperkalemic in the past - not adding salt to her food - BNP 08/09/23 was 241.9  2: HTN- - BP 128/67 - saw PCP Harvest Dark) 02/25 - BMET 09/16/23 reviewed and showed sodium 139, potassium 4.3, creatinine 1.25 & GFR 46  3: DM- - A1c 06/22/23 was 7.4% - continue insulin - continue metformin 1000mg  BID  4: COPD- - saw pulmonology Meredeth Ide) 11/24 - wearing oxygen at 2L around the clock - has OT coming to home  5: Atrial fibrillation- - saw cardiology (End) 11/24 - continue apixaban 5mg  BID - continue metoprolol tartrate 12.5mg  BID  6: Chronic pain- - follows with pain clinic - has chronic intrathecal pain pump - continue gabapentin 400mg  TID  7: Anemia- - Hg 09/16/23 reviewed and was 10.7 - sees GI Gerre Pebbles) 03/25 - continue ferrous sulfate 325mg  BID    Return in 1 month, sooner if needed.  Delma Freeze, FNP 10/21/23

## 2023-10-22 ENCOUNTER — Ambulatory Visit

## 2023-10-22 ENCOUNTER — Telehealth: Payer: Self-pay

## 2023-10-22 ENCOUNTER — Encounter: Payer: Self-pay | Admitting: Oncology

## 2023-10-22 ENCOUNTER — Ambulatory Visit: Attending: Family | Admitting: Family

## 2023-10-22 ENCOUNTER — Encounter: Payer: Self-pay | Admitting: Family

## 2023-10-22 ENCOUNTER — Other Ambulatory Visit (HOSPITAL_COMMUNITY): Payer: Self-pay

## 2023-10-22 VITALS — BP 120/55 | HR 75 | Wt 137.0 lb

## 2023-10-22 DIAGNOSIS — K589 Irritable bowel syndrome without diarrhea: Secondary | ICD-10-CM | POA: Insufficient documentation

## 2023-10-22 DIAGNOSIS — Z87891 Personal history of nicotine dependence: Secondary | ICD-10-CM | POA: Insufficient documentation

## 2023-10-22 DIAGNOSIS — Z7985 Long-term (current) use of injectable non-insulin antidiabetic drugs: Secondary | ICD-10-CM | POA: Insufficient documentation

## 2023-10-22 DIAGNOSIS — E1140 Type 2 diabetes mellitus with diabetic neuropathy, unspecified: Secondary | ICD-10-CM | POA: Diagnosis not present

## 2023-10-22 DIAGNOSIS — I4819 Other persistent atrial fibrillation: Secondary | ICD-10-CM

## 2023-10-22 DIAGNOSIS — Z7984 Long term (current) use of oral hypoglycemic drugs: Secondary | ICD-10-CM | POA: Insufficient documentation

## 2023-10-22 DIAGNOSIS — G4733 Obstructive sleep apnea (adult) (pediatric): Secondary | ICD-10-CM | POA: Diagnosis not present

## 2023-10-22 DIAGNOSIS — Z79899 Other long term (current) drug therapy: Secondary | ICD-10-CM | POA: Insufficient documentation

## 2023-10-22 DIAGNOSIS — M5489 Other dorsalgia: Secondary | ICD-10-CM | POA: Insufficient documentation

## 2023-10-22 DIAGNOSIS — E538 Deficiency of other specified B group vitamins: Secondary | ICD-10-CM | POA: Diagnosis not present

## 2023-10-22 DIAGNOSIS — Z794 Long term (current) use of insulin: Secondary | ICD-10-CM | POA: Insufficient documentation

## 2023-10-22 DIAGNOSIS — J449 Chronic obstructive pulmonary disease, unspecified: Secondary | ICD-10-CM | POA: Diagnosis not present

## 2023-10-22 DIAGNOSIS — I5020 Unspecified systolic (congestive) heart failure: Secondary | ICD-10-CM | POA: Insufficient documentation

## 2023-10-22 DIAGNOSIS — Z7901 Long term (current) use of anticoagulants: Secondary | ICD-10-CM | POA: Diagnosis not present

## 2023-10-22 DIAGNOSIS — Z9981 Dependence on supplemental oxygen: Secondary | ICD-10-CM | POA: Diagnosis not present

## 2023-10-22 DIAGNOSIS — D649 Anemia, unspecified: Secondary | ICD-10-CM | POA: Insufficient documentation

## 2023-10-22 DIAGNOSIS — E119 Type 2 diabetes mellitus without complications: Secondary | ICD-10-CM

## 2023-10-22 DIAGNOSIS — G894 Chronic pain syndrome: Secondary | ICD-10-CM | POA: Insufficient documentation

## 2023-10-22 DIAGNOSIS — J4489 Other specified chronic obstructive pulmonary disease: Secondary | ICD-10-CM | POA: Diagnosis not present

## 2023-10-22 DIAGNOSIS — I428 Other cardiomyopathies: Secondary | ICD-10-CM | POA: Insufficient documentation

## 2023-10-22 DIAGNOSIS — Z853 Personal history of malignant neoplasm of breast: Secondary | ICD-10-CM | POA: Diagnosis not present

## 2023-10-22 DIAGNOSIS — D508 Other iron deficiency anemias: Secondary | ICD-10-CM

## 2023-10-22 DIAGNOSIS — I10 Essential (primary) hypertension: Secondary | ICD-10-CM

## 2023-10-22 DIAGNOSIS — Z86711 Personal history of pulmonary embolism: Secondary | ICD-10-CM | POA: Diagnosis not present

## 2023-10-22 DIAGNOSIS — I480 Paroxysmal atrial fibrillation: Secondary | ICD-10-CM | POA: Diagnosis not present

## 2023-10-22 DIAGNOSIS — Z978 Presence of other specified devices: Secondary | ICD-10-CM | POA: Diagnosis not present

## 2023-10-22 DIAGNOSIS — K219 Gastro-esophageal reflux disease without esophagitis: Secondary | ICD-10-CM | POA: Insufficient documentation

## 2023-10-22 DIAGNOSIS — I110 Hypertensive heart disease with heart failure: Secondary | ICD-10-CM | POA: Diagnosis not present

## 2023-10-22 DIAGNOSIS — E7800 Pure hypercholesterolemia, unspecified: Secondary | ICD-10-CM | POA: Diagnosis not present

## 2023-10-22 LAB — BASIC METABOLIC PANEL
BUN/Creatinine Ratio: 12 (ref 12–28)
BUN: 12 mg/dL (ref 8–27)
CO2: 22 mmol/L (ref 20–29)
Calcium: 9.8 mg/dL (ref 8.7–10.3)
Chloride: 101 mmol/L (ref 96–106)
Creatinine, Ser: 1.04 mg/dL — ABNORMAL HIGH (ref 0.57–1.00)
Glucose: 99 mg/dL (ref 70–99)
Potassium: 4.7 mmol/L (ref 3.5–5.2)
Sodium: 140 mmol/L (ref 134–144)
eGFR: 57 mL/min/{1.73_m2} — ABNORMAL LOW (ref >59)

## 2023-10-22 MED ORDER — DAPAGLIFLOZIN PROPANEDIOL 10 MG PO TABS: 10.0000 mg | ORAL_TABLET | Freq: Every day | ORAL | 3 refills | Status: AC

## 2023-10-22 NOTE — Telephone Encounter (Signed)
 Patient Advocate Encounter  Test billing for Sherryll Burger returns $0 copay for 90 days on the current insurance coverage.  Burnell Blanks, CPhT Rx Patient Advocate Phone: [phone]

## 2023-10-22 NOTE — Patient Instructions (Signed)
 Lab Work:  Go DOWN to LOWER LEVEL (LL) to have your blood work completed inside of [employer] office.  We will only call you if the results are abnormal or if the provider would like to make medication changes.   Follow-Up in: 1 MONTH TINA HACKNEY, FNP.  At the [facility], you and your health needs are our priority. We have a designated team specialized in the treatment of Heart Failure. This Care Team includes your primary Heart Failure Specialized Cardiologist (physician), Advanced Practice Providers (APPs- Physician Assistants and Nurse Practitioners), and Pharmacist who all work together to provide you with the care you need, when you need it.   You may see any of the following providers on your designated Care Team at your next follow up:  Dr. Arvilla Meres Dr. Marca Ancona Dr. Dorthula Nettles Dr. Theresia Bough Clarisa Kindred, FNP Enos Fling, RPH-CPP  Please be sure to bring in all your medications bottles to every appointment.   Need to Contact us:  If you have any questions or concerns before your next appointment please send [place] a message through [place] or call our office at [phone].    TO LEAVE A MESSAGE FOR THE NURSE SELECT OPTION 2, PLEASE LEAVE A MESSAGE INCLUDING: YOUR NAME DATE OF BIRTH CALL BACK NUMBER REASON FOR CALL**this is important as we prioritize the call backs  YOU WILL RECEIVE A CALL BACK THE SAME DAY AS LONG AS YOU CALL BEFORE 4:00 PM

## 2023-10-22 NOTE — Progress Notes (Signed)
 [facility] - HEART FAILURE CLINIC - PHARMACIST COUNSELING NOTE  Guideline-Directed Medical Therapy/Evidence Based Medicine  ACE/ARB/ARNI: {AR_ARNI/ACEi/ARB:25599} Beta Blocker: {AR_Beta blocker:25598} Aldosterone Antagonist: {AR_Mineralocorticoid receptor antagonists:25602} Diuretic: {AR_Diuretics:25604} SGLT2i: {AR_SGLT2i:25603}  Adherence Assessment  Do you ever forget to take your medication? [] Yes [x] No  Do you ever skip doses due to side effects? [] Yes [x] No  Do you have trouble affording your medicines? [] Yes [x] No  Are you ever unable to pick up your medication due to transportation difficulties? [] Yes [x] No  Do you ever stop taking your medications because you don't believe they are helping? [] Yes [x] No  Do you check your weight daily? [] Yes [x] No   Adherence strategy: Pill box  Barriers to obtaining medications: None reported   Vital signs: HR ***, BP ***, weight (pounds) *** ECHO: Date ***, EF ***, notes *** Cath: Date ***, EF ***, notes ***     Latest Ref Rng & Units 09/16/2023    8:28 AM 09/04/2023    1:48 PM 08/28/2023   10:35 AM  BMP  Glucose 70 - 99 mg/dL 578  469  629   BUN 8 - 27 mg/dL 16  19  31    Creatinine 0.57 - 1.00 mg/dL 5.28  4.13  2.44   BUN/Creat Ratio 12 - 28 13  15  30    Sodium 134 - 144 mmol/L 139  139  139   Potassium 3.5 - 5.2 mmol/L 4.3  3.9  5.0   Chloride 96 - 106 mmol/L 95  95  92   CO2 20 - 29 mmol/L 29  25  30    Calcium 8.7 - 10.3 mg/dL 9.4  9.5  9.8     Past Medical History:  Diagnosis Date   Arthritis    Asthma    Atrial fibrillation (HCC)    Breast cancer (HCC) 1998   right breast ca with mastectomy and chemotherapy and radiation   Bronchitis    CHF (congestive heart failure) (HCC)    "with Morphine withdrawal"   COPD (chronic obstructive pulmonary disease) (HCC)    Diabetes mellitus without complication (HCC)    Diverticulitis    diverticulosis also   Dyspnea    Endometriosis    GERD  (gastroesophageal reflux disease)    History of shingles 2000-2005   Hypercholesteremia    Hypertension    IBS (irritable bowel syndrome)    Low back pain    a. Implanted morphine/bupivicaine/clonidine pump.   Neuropathy    Orthopnea    Oxygen dependent    uses at night   Personal history of chemotherapy    Personal history of radiation therapy    Pneumonia    pneumonia 5-6 times, history of bronchitis also   Scoliosis    Sleep apnea    does not use cpap   Stroke (HCC) 2010   TIA, 10 years ago   Withdrawal from sedative drug (HCC)    withdrawal from morphine when pump batteries died    ASSESSMENT *** year old {Gender Description:[id number]} who presents to the HF clinic ***  Recent ED Visit (past 6 months): Date - ***, CC - ***  PLAN     Time spent: *** minutes  [employer], Pharm.D. Clinical Pharmacist 10/22/2023 1:50 PM    Current Outpatient Medications:    ACCU-CHEK AVIVA PLUS test strip, TEST THREE TIMES DAILY, Disp: 300 strip, Rfl: 3   Accu-Chek Softclix Lancets lancets, TEST BLOOD SUGAR THREE TIMES DAILY, Disp: 300 each, Rfl: 3   albuterol (PROVENTIL) (2.5 MG/3ML)  0.083% nebulizer solution, Take 3 mLs (2.5 mg total) by nebulization every 4 (four) hours as needed for wheezing or shortness of breath., Disp: 75 mL, Rfl: 1   albuterol (VENTOLIN HFA) 108 (90 Base) MCG/ACT inhaler, Inhale 2 puffs into the lungs every 6 (six) hours as needed for wheezing or shortness of breath., Disp: 1 each, Rfl: 1   alum & mag hydroxide-simeth (MAALOX/MYLANTA) 200-200-20 MG/5ML suspension, Take 30 mLs by mouth every 6 (six) hours as needed for indigestion or heartburn., Disp: 300 mL, Rfl: 1   apixaban (ELIQUIS) 5 MG TABS tablet, Take 1 tablet (5 mg total) by mouth 2 (two) times daily., Disp: 60 tablet, Rfl: 0   atorvastatin (LIPITOR) 40 MG tablet, Take 1 tablet (40 mg total) by mouth daily., Disp: 30 tablet, Rfl: 1   Blood Glucose Monitoring Suppl (TRUE METRIX METER) w/Device KIT, Use  to check blood sugar 4 times a day, Disp: 1 kit, Rfl: 4   busPIRone (BUSPAR) 5 MG tablet, Take 1 tablet (5 mg total) by mouth 2 (two) times daily., Disp: 30 tablet, Rfl: 1   cholecalciferol (VITAMIN D3) 25 MCG (1000 UNIT) tablet, Take 1,000 Units by mouth daily., Disp: , Rfl:    Continuous Glucose Receiver (FREESTYLE LIBRE 2 READER) DEVI, To check blood sugars 3-4 times daily due to insulin use. DX E11.40, Disp: 2 each, Rfl: 5   Continuous Glucose Sensor (FREESTYLE LIBRE 2 SENSOR) MISC, To check blood sugars 3-4 times daily due to insulin use. DX E11.40, Disp: 2 each, Rfl: 5   Cyanocobalamin 1000 MCG/ML KIT, Inject 1,000 mcg as directed every 30 (thirty) days., Disp: , Rfl:    cyclobenzaprine (FLEXERIL) 5 MG tablet, Take by mouth., Disp: , Rfl:    dapagliflozin propanediol (FARXIGA) 10 MG TABS tablet, Take 1 tablet (10 mg total) by mouth daily before breakfast., Disp: , Rfl:    docusate sodium (COLACE) 100 MG capsule, Take 1 capsule (100 mg total) by mouth 2 (two) times daily as needed for mild constipation., Disp: 30 capsule, Rfl: 0   feeding supplement (ENSURE ENLIVE / ENSURE PLUS) LIQD, Take 237 mLs by mouth 3 (three) times daily between meals., Disp: 237 mL, Rfl: 12   Ferrous Sulfate (IRON) 325 (65 Fe) MG TABS, Take 1 tablet by mouth 2 (two) times daily., Disp: , Rfl:    ferrous sulfate 325 (65 FE) MG EC tablet, Take 1 tablet (325 mg total) by mouth 2 (two) times daily., Disp: 60 tablet, Rfl: 1   folic acid (FOLVITE) 1 MG tablet, TAKE 1 TABLET EVERY DAY, Disp: 90 tablet, Rfl: 0   furosemide (LASIX) 40 MG tablet, Take 0.5 tablets (20 mg total) by mouth daily., Disp: 30 tablet, Rfl: 1   gabapentin (NEURONTIN) 400 MG capsule, Take 1 capsule (400 mg total) by mouth 3 (three) times daily., Disp: 90 capsule, Rfl: 1   insulin lispro (HUMALOG KWIKPEN) 100 UNIT/ML KwikPen, Inject 5 Units into the skin 3 (three) times daily before meals. Do not inject insulin if you are not going to eat meal.  Only take  before a meal., Disp: 15 mL, Rfl: 3   losartan (COZAAR) 25 MG tablet, Take 0.5 tablets (12.5 mg total) by mouth daily., Disp: 30 tablet, Rfl: 1   metFORMIN (GLUCOPHAGE) 1000 MG tablet, Take 1 tablet (1,000 mg total) by mouth 2 (two) times daily with a meal., Disp: 60 tablet, Rfl: 1   metoprolol tartrate (LOPRESSOR) 25 MG tablet, Take 0.5 tablets (12.5 mg total) by mouth 2 (two)  times daily., Disp: 30 tablet, Rfl: 1   montelukast (SINGULAIR) 10 MG tablet, Take 1 tablet (10 mg total) by mouth at bedtime., Disp: 30 tablet, Rfl: 0   Multiple Vitamin (MULTIVITAMIN WITH MINERALS) TABS tablet, Take 1 tablet by mouth daily., Disp: 30 tablet, Rfl: 1   naloxone (NARCAN) nasal spray 4 mg/0.1 mL, Place 1 spray into the nose as needed for up to 365 doses (for opioid-induced respiratory depresssion). In case of emergency (overdose), spray once into each nostril. If no response within 3 minutes, repeat application and call 911., Disp: 1 each, Rfl: 1   Nebulizers (EASY AIR COMPRESSOR NEBULIZER) MISC, Use to inhaler nebulizer treatments at needed per instructions on nebulizer prescription, Disp: 1 each, Rfl: 0   ondansetron (ZOFRAN) 4 MG tablet, Take 1 tablet (4 mg total) by mouth daily as needed for nausea or vomiting., Disp: 30 tablet, Rfl: 1   OXYGEN, Inhale 2 L into the lungs at bedtime., Disp: , Rfl:    PAIN MANAGEMENT INTRATHECAL, IT, PUMP, 1 each by Intrathecal route. Intrathecal (IT) medication:  Morphine 5.0 mg/ml, Bupivacaine 20.0 mg/ml,  Clonidine 100.0 mcg/ml Patient does not remember current. Adjusted every 3 months at [facility], Disp: , Rfl:    pantoprazole (PROTONIX) 40 MG tablet, Take 1 tablet (40 mg total) by mouth daily., Disp: 30 tablet, Rfl: 1   polyethylene glycol (MIRALAX / GLYCOLAX) 17 g packet, Take 17 g by mouth daily as needed for moderate constipation., Disp: 10 packet, Rfl: 0   TRELEGY ELLIPTA 100-62.5-25 MCG/ACT AEPB, INHALE 1 PUFF INTO THE LUNGS DAILY . 42 DAY EXPIRATION AFTER  OPENING FOIL TRAY, Disp: 180 each, Rfl: 3   TRESIBA FLEXTOUCH 100 UNIT/ML FlexTouch Pen, INJECT 55 UNITS SUBCUTANEOUSLY AT BEDTIME, Disp: 30 mL, Rfl: 0   TRULICITY 4.5 MG/0.5ML SOPN, INJECT 4.5MG  INTO THE SKIN ONCE WEEKLY AS DIRECTED, Disp: 12 mL, Rfl: 0   venlafaxine XR (EFFEXOR-XR) 150 MG 24 hr capsule, Take 1 capsule (150 mg total) by mouth daily., Disp: 30 capsule, Rfl: 0   COUNSELING POINTS/CLINICAL PEARLS (*** ENTER DOT PHRASE "HFCMEDCOUNSELING" IF NEEDED, OTHERWISE DELETE HEADER***)   DRUGS TO CAUTION IN HEART FAILURE  Drug or Class Mechanism  Analgesics NSAIDs COX-2 inhibitors Glucocorticoids  Sodium and water retention, increased systemic vascular resistance, decreased response to diuretics   Diabetes Medications Metformin Thiazolidinediones Rosiglitazone (Avandia) Pioglitazone (Actos) DPP4 Inhibitors Saxagliptin (Onglyza) Sitagliptin (Januvia)   Lactic acidosis Possible calcium channel blockade   Unknown  Antiarrhythmics Class I  Flecainide Disopyramide Class III Sotalol Other Dronedarone  Negative inotrope, proarrhythmic   Proarrhythmic, beta blockade  Negative inotrope  Antihypertensives Alpha Blockers Doxazosin Calcium Channel Blockers Diltiazem Verapamil Nifedipine Central Alpha Adrenergics Moxonidine Peripheral Vasodilators Minoxidil  Increases renin and aldosterone  Negative inotrope    Possible sympathetic withdrawal  Unknown  Anti-infective Itraconazole Amphotericin B  Negative inotrope Unknown  Hematologic Anagrelide Cilostazol   Possible inhibition of PD IV Inhibition of PD III causing arrhythmias  Neurologic/Psychiatric Stimulants Anti-Seizure Drugs Carbamazepine Pregabalin Antidepressants Tricyclics Citalopram Parkinsons Bromocriptine Pergolide Pramipexole Antipsychotics Clozapine Antimigraine Ergotamine Methysergide Appetite suppressants Bipolar Lithium  Peripheral alpha and beta agonist  activity  Negative inotrope and chronotrope Calcium channel blockade  Negative inotrope, proarrhythmic Dose-dependent QT prolongation  Excessive serotonin activity/valvular damage Excessive serotonin activity/valvular damage Unknown  IgE mediated hypersensitivy, calcium channel blockade  Excessive serotonin activity/valvular damage Excessive serotonin activity/valvular damage Valvular damage  Direct myofibrillar degeneration, adrenergic stimulation  Antimalarials Chloroquine Hydroxychloroquine Intracellular inhibition of lysosomal enzymes  Urologic Agents Alpha  Blockers Doxazosin Prazosin Tamsulosin Terazosin  Increased renin and aldosterone  Adapted from Page Williemae Natter, et al. "Drugs That May Cause or Exacerbate Heart Failure: A Scientific Statement from the American Heart  Association." Circulation 2016; 134:e32-e69. DOI: 10.1161/CIR.0000000000000426   MEDICATION ADHERENCES TIPS AND STRATEGIES Taking medication as prescribed improves patient outcomes in heart failure (reduces hospitalizations, improves symptoms, increases survival) Side effects of medications can be managed by decreasing doses, switching agents, stopping drugs, or adding additional therapy. Please let someone in the Heart Failure Clinic know if you have having bothersome side effects so we can modify your regimen. Do not alter your medication regimen without talking to [place].  Medication reminders can help patients remember to take drugs on time. If you are missing or forgetting doses you can try linking behaviors, using pill boxes, or an electronic reminder like an alarm on your phone or an app. Some people can also get automated phone calls as medication reminders.

## 2023-10-23 ENCOUNTER — Encounter: Payer: Medicare HMO | Admitting: Family

## 2023-10-23 ENCOUNTER — Ambulatory Visit

## 2023-10-23 DIAGNOSIS — E538 Deficiency of other specified B group vitamins: Secondary | ICD-10-CM | POA: Diagnosis not present

## 2023-10-23 MED ORDER — CYANOCOBALAMIN 1000 MCG/ML IJ SOLN
1000.0000 ug | Freq: Once | INTRAMUSCULAR | Status: AC
  Administered 2023-10-23: 1000 ug via INTRAMUSCULAR

## 2023-10-26 ENCOUNTER — Encounter: Payer: Self-pay | Admitting: Family

## 2023-11-12 ENCOUNTER — Other Ambulatory Visit: Payer: Self-pay | Admitting: Nurse Practitioner

## 2023-11-13 ENCOUNTER — Ambulatory Visit: Payer: Self-pay | Admitting: Nurse Practitioner

## 2023-11-13 ENCOUNTER — Ambulatory Visit: Payer: Self-pay

## 2023-11-13 NOTE — Telephone Encounter (Signed)
 Please add patient to Jordan Chan's schedule on 11/17/23 at 4:20 per Jordan Chan. I cannot as time slot is marked for same day.

## 2023-11-13 NOTE — Telephone Encounter (Signed)
  Chief Complaint: Neck Pain/Shoulder pain Symptoms: neck and shoulder pain on right side with tingling into face and ear-Pain 9 out of 10 Frequency: started a week to 2 weeks ago Pertinent Negatives: Patient denies CP,  Disposition: [] ED /[] Urgent Care (no appt availability in office) / [] Appointment(In office/virtual)/ []  [facility]/ [] Home Care/ [] Refused Recommended Disposition /[] [facility]/ [x]  Follow-up with PCP Additional Notes: patient with history of torticollis called in with pain to right neck and shoulders with tingling into her face and ear. Patient endorses pain level of 9 out of 10. Patient is recommended to be seen today. PCP not available today but another provider in office is available. Patient refused as she doesn't know the provider. Patient states she will wait to see her PCP. Patient will need a phone call for scheduling and recommendations. E2C2 RN is unable to schedule as this is out of protocol time. Patient verbalized understanding of plan and all questions answered.      Copied from CRM [id number]. Topic: Clinical - Red Word Triage >> Nov 13, 2023  1:04 PM Shelah Lewandowsky wrote: Red Word that prompted transfer to Nurse Triage: patient has torticollis and having a lot pain in neck and shoulders with tingling going into face and ear, pain level is a 9 Reason for Disposition  [1] SEVERE neck pain (e.g., excruciating, unable to do any normal activities) AND [2] not improved after 2 hours of pain medicine  Answer Assessment - Initial Assessment Questions 1. ONSET: "When did the pain begin?"      Pain got worse about a week to two weeks ago 2. LOCATION: "Where does it hurt?"      Right side 3. PATTERN "Does the pain come and go, or has it been constant since it started?"      constant 4. SEVERITY: "How bad is the pain?"  (Scale 1-10; or mild, moderate, severe)   - NO PAIN (0): no pain or only slight stiffness    - MILD (1-3): doesn't interfere with  normal activities    - MODERATE (4-7): interferes with normal activities or awakens from sleep    - SEVERE (8-10):  excruciating pain, unable to do any normal activities      9 out 10 5. RADIATION: "Does the pain go anywhere else, shoot into your arms?"     no 6. CORD SYMPTOMS: "Any weakness or numbness of the arms or legs?"     no 7. CAUSE: "What do you think is causing the neck pain?"     unsure 8. NECK OVERUSE: "Any recent activities that involved turning or twisting the neck?"     no 9. OTHER SYMPTOMS: "Do you have any other symptoms?" (e.g., headache, fever, chest pain, difficulty breathing, neck swelling)     Tingling into face and ear, pain into shoulders.  Protocols used: Neck Pain or Stiffness-A-AH

## 2023-11-13 NOTE — Telephone Encounter (Signed)
 Requested Prescriptions  Pending Prescriptions Disp Refills   Continuous Glucose Sensor (FREESTYLE LIBRE 2 SENSOR) MISC [Pharmacy Med Name: FREESTYLE LIBRE 2 SEN 14D KIT] 2 each 5    Sig: USE 1 SENSOR TO CHECK BLOOD SUGARS 3-4 TIMES DAILY DUE TO INSULIN USE     Endocrinology: Diabetes - Testing Supplies Passed - 11/13/2023 12:10 PM      Passed - Valid encounter within last 12 months    Recent Outpatient Visits           1 month ago Insulin dependent type 2 diabetes mellitus (HCC)   [facility] [place], Dorie Rank, NP       Future Appointments             In 1 month [place], Loel Dubonnet, MD [facility] [place]   In 1 month [place], Dorie Rank, NP [place] [facility]

## 2023-11-13 NOTE — Telephone Encounter (Signed)
 Chief Complaint: Neck pain/shoulder pain  Additional Notes: Pt spoke with nurse triage this morning. Per Aura Dials, FNP, states: "Can we place her on 11/17/23 at 4:20 pm please.  Alert her if any worsening of symptoms, especially with the tingling in face I highly recommend ER visit due to her risk factors." Pt states understanding and is agreeable to plan. Reason for Disposition  Health Information question, no triage required and triager able to answer question  Answer Assessment - Initial Assessment Questions 1. REASON FOR CALL or QUESTION: "What is your reason for calling today?" or "How can I best help you?" or "What question do you have that I can help answer?"     Pt wants Jolene [name]'s reponse  Protocols used: Information Only Call - No Triage-A-AH

## 2023-11-17 ENCOUNTER — Telehealth (INDEPENDENT_AMBULATORY_CARE_PROVIDER_SITE_OTHER): Admitting: Nurse Practitioner

## 2023-11-17 ENCOUNTER — Other Ambulatory Visit: Payer: Self-pay

## 2023-11-17 ENCOUNTER — Encounter: Payer: Self-pay | Admitting: Nurse Practitioner

## 2023-11-17 VITALS — BP 134/68 | HR 94 | Temp 98.0°F | Ht <= 58 in | Wt 138.0 lb

## 2023-11-17 DIAGNOSIS — M436 Torticollis: Secondary | ICD-10-CM | POA: Diagnosis not present

## 2023-11-17 MED ORDER — PREDNISONE 10 MG (21) PO TBPK: ORAL_TABLET | ORAL | 0 refills | Status: DC

## 2023-11-17 NOTE — Assessment & Plan Note (Addendum)
 Chronic with recent increased pain to right side of neck. Her head at baseline tilts to left and she has chronic pain at baseline, but currently worse. No red flags on exam.  Would avoid Klonopin, which she has taken in past, due to Morphine with pain pump and Gabapentin use - concern for respiratory depression with her underlying lung disease.  Recent A1c was stable, will send in steroid taper + recommend she continue heat at home.  Add on Voltaren gel OTC and massage into neck a few times daily.  Would benefit PT, will get into [facility] for ortho assessment and suspect they will add on PT -- she prefers to go there.

## 2023-11-17 NOTE — Patient Instructions (Signed)
Cervical Radiculopathy  Cervical radiculopathy happens when a nerve in the neck (a cervical nerve) is pinched or bruised. This condition can happen because of an injury to the cervical spine (vertebrae) in the neck, or as part of the normal aging process. Pressure on the cervical nerves can cause pain or numbness that travels from the neck all the way down to the arm and fingers. This condition usually gets better with rest. Treatment may be needed if the condition does not improve. What are the causes? This condition may be caused by: A neck injury. A bulging (herniated) disk. Muscle spasms. Muscle tightness in the neck due to overuse. Arthritis. Breakdown or degeneration in the bones and joints of the spine (spondylosis) due to aging. Bone spurs that may develop near the cervical nerves. What are the signs or symptoms? Symptoms of this condition include: Pain. The pain may travel from the neck to the arm and hand. The pain can be severe or irritating. It may get worse when you move your neck. Numbness or tingling in your arm or hand. Weakness in the affected arm and hand, in severe cases. How is this diagnosed? This condition may be diagnosed based on your symptoms, your medical history, and a physical exam. You may also have tests, including: X-rays. CT scan. MRI. Electromyogram (EMG). Nerve conduction tests. How is this treated? In many cases, treatment is not needed for this condition. With rest, the condition usually gets better over time. If treatment is needed, options may include: Wearing a soft neck collar (cervical collar) for short periods of time. Doing physical therapy to strengthen your neck muscles. Taking medicines. These may include NSAIDs, such as ibuprofen, or oral corticosteroids. Having spinal injections, in severe cases. Having surgery. This may be needed if other treatments do not help. Different types of surgery may be done depending on the cause of this  condition. Follow these instructions at home: If you have a cervical collar: Wear it as told by your health care provider. Remove it only as told by your health care provider. Ask your health care provider if you can remove the cervical collar for cleaning and bathing. If you are allowed to remove the collar for cleaning or bathing: Follow instructions from your health care provider about how to remove the collar safely. Clean the collar by wiping it with mild soap and water and drying it completely. Take out any removable pads in the collar every 1-2 days, and wash them by hand with soap and water. Let them air-dry completely before you put them back in the collar. Check your skin under the collar for irritation or sores. If you see any, tell your health care provider. Managing pain     Take over-the-counter and prescription medicines only as told by your health care provider. If directed, put ice on the affected area. To do this: If you have a soft neck collar, remove it as told by your health care provider. Put ice in a plastic bag. Place a towel between your skin and the bag. Leave the ice on for 20 minutes, 2-3 times a day. Remove the ice if your skin turns bright red. This is very important. If you cannot feel pain, heat, or cold, you have a greater risk of damage to the area. If applying ice does not help, you can try using heat. Use the heat source that your health care provider recommends, such as a moist heat pack or a heating pad. Place a towel between   your skin and the heat source. Leave the heat on for 20-30 minutes. Remove the heat if your skin turns bright red. This is especially important if you are unable to feel pain, heat, or cold. You have a greater risk of getting burned. Try a gentle neck and shoulder massage to help relieve symptoms. Activity Rest as needed. Return to your normal activities as told by your health care provider. Ask your health care provider what  activities are safe for you. Do stretching and strengthening exercises as told by your health care provider or your physical therapist. You may have to avoid lifting. Ask your health care provider how much you can safely lift. General instructions Use a flat pillow when you sleep. Do not drive while wearing a cervical collar. If you do not have a cervical collar, ask your health care provider if it is safe to drive while your neck heals. Ask your health care provider if the medicine prescribed to you requires you to avoid driving or using machinery. Do not use any products that contain nicotine or tobacco. These products include cigarettes, chewing tobacco, and vaping devices, such as e-cigarettes. If you need help quitting, ask your health care provider. Keep all follow-up visits. This is important. Contact a health care provider if: Your condition does not improve with treatment. Get help right away if: Your pain gets much worse and is not controlled with medicines. You have weakness or numbness in your hand, arm, face, or leg. You have a high fever. You have a stiff, rigid neck. You lose control of your bowels or your bladder (have incontinence). You have trouble with walking, balance, or speaking. Summary Cervical radiculopathy happens when a nerve in the neck is pinched or bruised. A nerve can get pinched from a bulging disk, arthritis, muscle spasms, or an injury to the neck. Symptoms include pain, tingling, or numbness radiating from the neck to the arm or hand. Weakness can also occur in severe cases. Treatment may include rest, wearing a cervical collar, and physical therapy. Medicines may be prescribed to help with pain. In severe cases, injections or surgery may be needed. This information is not intended to replace advice given to you by your health care provider. Make sure you discuss any questions you have with your health care provider. Document Revised: 01/24/2021 Document  Reviewed: 01/24/2021 Elsevier Patient Education  2024 Elsevier Inc.  

## 2023-11-17 NOTE — Patient Instructions (Signed)
 Thank you for allowing the Care Management team to participate in your care. It was great speaking with you today! I hope your neck pain improves.   We will follow up on Dec 15, 2023 at 3:30pm. Please do not hesitate to contact me if you require assistance prior to our next outreach.    Roxie Cord [facility] Health RN Care Manager Direct Dial: [phone]  Fax: [phone] Website: [email or web address]   '

## 2023-11-17 NOTE — Patient Outreach (Signed)
 Complex Care Management   Visit Note  11/17/2023  Name:  Jordan Chan MRN: [id number] DOB: Jun 27, 1951  Situation: Referral received for Complex Care Management related to Heart Failure, COPD, Atrial Fibrillation, DM, HTN and HLD. I obtained verbal consent from Patient.  Visit completed with [name] via telephone.  Background:   Past Medical History:  Diagnosis Date   Arthritis    Asthma    Atrial fibrillation (HCC)    Breast cancer (HCC) 1998   right breast ca with mastectomy and chemotherapy and radiation   Bronchitis    CHF (congestive heart failure) (HCC)    "with Morphine withdrawal"   COPD (chronic obstructive pulmonary disease) (HCC)    Diabetes mellitus without complication (HCC)    Diverticulitis    diverticulosis also   Dyspnea    Endometriosis    GERD (gastroesophageal reflux disease)    History of shingles 2000-2005   Hypercholesteremia    Hypertension    IBS (irritable bowel syndrome)    Low back pain    a. Implanted morphine/bupivicaine/clonidine pump.   Neuropathy    Orthopnea    Oxygen dependent    uses at night   Personal history of chemotherapy    Personal history of radiation therapy    Pneumonia    pneumonia 5-6 times, history of bronchitis also   Scoliosis    Sleep apnea    does not use cpap   Stroke (HCC) 2010   TIA, 10 years ago   Withdrawal from sedative drug (HCC)    withdrawal from morphine when pump batteries died    Assessment: Patient Reported Symptoms:  Cognitive Cognitive Status: Alert and oriented to person, place, and time, Normal speech and language skills   Health Maintenance Behaviors: Annual physical exam, Spiritual practice(s), Stress management Healing Pattern: Unsure Health Facilitated by: Pain control, Stress management, Healthy diet  Neurological   Neurological Comment: No Symptoms Reported  HEENT HEENT Symptoms Reported: No symptoms reported HEENT Comment: No Symptoms Reported    Cardiovascular  Cardiovascular Symptoms Reported: No symptoms reported Does patient have uncontrolled Hypertension?: No Cardiovascular Conditions: Cardiomyopathy, Coronary artery disease, Dysrhythmia, Heart failure, High blood cholesterol, Hypertension (History of NSTEMi) Cardiovascular Management Strategies: Adequate rest, Coping strategies, Diet modification, Medical device, Medication therapy, Routine screening Cardiovascular Self-Management Outcome: 4 (good) Cardiovascular Comment: No symptoms reported  Respiratory Respiratory Symptoms Reported: No symptoms reported Respiratory Conditions: COPD, Sleep disordered breathing (Allergic Rhinitis) Respiratory Self-Management Outcome: 4 (good) Respiratory Comment: No Symptoms Reported  Endocrine Patient reports the following symptoms related to hypoglycemia or hyperglycemia : No symptoms reported Is patient diabetic?: Yes Is patient checking blood sugars at home?: Yes Endocrine Conditions: Diabetes, Vitamin D deficiency Endocrine Management Strategies: Diet modification, Coping strategies, Medical device, Medication therapy, Routine screening Endocrine Self-Management Outcome: 4 (good) Endocrine Comment: Denies hypoglycemic of hyperglycemic episdoes  Gastrointestinal Gastrointestinal Symptoms Reported: Constipation Additional Gastrointestinal Details: Reports chronic constipation. She requires a morphine pump for pain management. Reports taking medications and stool softners as instructed. Gastrointestinal Conditions: Constipation Gastrointestinal Management Strategies: Adequate rest, Coping strategies, Diet modification, Medication therapy Gastrointestinal Self-Management Outcome: 3 (uncertain) Nutrition Risk Screen (CP): No indicators present  Genitourinary   Genitourinary Comment: No Symptoms Reported  Integumentary Integumentary Symptoms Reported: No symptoms reported Skin Comment: No Symptoms Reported  Musculoskeletal Musculoskelatal Symptoms Reviewed:  Unsteady gait Musculoskeletal Conditions: Back pain, Osteopenia, Scoliosis (Torticollis) Musculoskeletal Management Strategies: Coping strategies, Medical device, Medication therapy, Routine screening Musculoskeletal Self-Management Outcome: 3 (uncertain) Musculoskeletal Comment: Pending evaluation by PCP today due  to worsening neck pain      Psychosocial Psychosocial Symptoms Reported: No symptoms reported   Major Change/Loss/Stressor/Fears (CP): Medical condition, family (Reports changes in routine due to assisting her spouse. Notes they are separated but he recently required eye surgery. Reports he is staying with her until he recovers) Techniques to [place] with Loss/Stress/Change: Diversional activities Quality of Family Relationships: supportive Do you feel physically threatened by others?: No      11/17/2023    4:21 PM  Depression screen PHQ 2/9  Decreased Interest 0  Down, Depressed, Hopeless 1  PHQ - 2 Score 1  Altered sleeping 3  Tired, decreased energy 2  Change in appetite 2  Feeling bad or failure about yourself  0  Trouble concentrating 0  Moving slowly or fidgety/restless 0  Suicidal thoughts 0  PHQ-9 Score 8  Difficult doing work/chores Not difficult at all    Vitals:    Medications Reviewed Today     Reviewed by Juanell Fairly, RN (Registered Nurse) on 11/17/23 at 1412  Med List Status: <None>   Medication Order Taking? Sig Documenting Provider Last Dose Status Informant  ACCU-CHEK AVIVA PLUS test strip [id number] No TEST THREE TIMES DAILY Marjie Skiff, NP Taking Active Self           Med Note (LLOYD, CRYSTAL L   Mon Aug 24, 2023  9:57 AM) Uses Libre device   Accu-Chek Softclix Lancets lancets [id number] No TEST BLOOD SUGAR THREE TIMES DAILY [name], Jolene T, NP Taking Active Self  albuterol (PROVENTIL) (2.5 MG/3ML) 0.083% nebulizer solution [id number] No Take 3 mLs (2.5 mg total) by nebulization every 4 (four) hours as needed for wheezing or shortness of  breath. Ernestene Mention, MD Taking Active            Med Note (LLOYD, CRYSTAL L   Mon Aug 24, 2023 10:07 AM) Linton Ham recently can't find neb machine   albuterol (VENTOLIN HFA) 108 (90 Base) MCG/ACT inhaler [id number] No Inhale 2 puffs into the lungs every 6 (six) hours as needed for wheezing or shortness of breath. Ernestene Mention, MD Taking Active   alum & mag hydroxide-simeth (MAALOX/MYLANTA) 200-200-20 MG/5ML suspension [id number] No Take 30 mLs by mouth every 6 (six) hours as needed for indigestion or heartburn. Ernestene Mention, MD Taking Active   apixaban (ELIQUIS) 5 MG TABS tablet [id number] No Take 1 tablet (5 mg total) by mouth 2 (two) times daily. Ernestene Mention, MD 10/22/2023 Active   atorvastatin (LIPITOR) 40 MG tablet [id number] No Take 1 tablet (40 mg total) by mouth daily. Ernestene Mention, MD 10/22/2023 Active   Blood Glucose Monitoring Suppl (TRUE METRIX METER) w/Device KIT [id number] No Use to check blood sugar 4 times a day [name], Jolene T, NP Taking Active Self  busPIRone (BUSPAR) 5 MG tablet [id number] No Take 1 tablet (5 mg total) by mouth 2 (two) times daily. Ernestene Mention, MD 10/22/2023 Active   cholecalciferol (VITAMIN D3) 25 MCG (1000 UNIT) tablet [id number] No Take 1,000 Units by mouth daily. [provider] Taking Active Self  Continuous Glucose Receiver (FREESTYLE LIBRE 2 READER) DEVI [id number] No To check blood sugars 3-4 times daily due to insulin use. DX E11.40 Marjie Skiff, NP Taking Active Self  Continuous Glucose Sensor (FREESTYLE LIBRE 2 SENSOR) MISC [id number]  USE 1 SENSOR TO CHECK BLOOD SUGARS 3-4 TIMES DAILY DUE TO INSULIN USE Aura Dials T, NP  Active   Cyanocobalamin 1000 MCG/ML KIT [id number] No Inject 1,000  mcg as directed every 30 (thirty) days. Next injection 3/21 [provider] Taking Active Self  cyclobenzaprine (FLEXERIL) 5 MG tablet [id number] No Take 5 mg by mouth daily as needed for muscle spasms. [provider] Taking  Active   dapagliflozin propanediol (FARXIGA) 10 MG TABS tablet [id number]  Take 1 tablet (10 mg total) by mouth daily before breakfast. Clarisa Kindred A, FNP  Active   docusate sodium (COLACE) 100 MG capsule [id number] No Take 1 capsule (100 mg total) by mouth 2 (two) times daily as needed for mild constipation. Ernestene Mention, MD Taking Active   feeding supplement (ENSURE ENLIVE / ENSURE PLUS) LIQD [id number] No Take 237 mLs by mouth 3 (three) times daily between meals. Ernestene Mention, MD Taking Active   ferrous sulfate 325 (65 FE) MG EC tablet [id number] No Take 1 tablet (325 mg total) by mouth 2 (two) times daily. Ernestene Mention, MD Taking Active   folic acid (FOLVITE) 1 MG tablet [id number] No TAKE 1 TABLET EVERY DAY Alinda Dooms, NP Taking Active Self  furosemide (LASIX) 40 MG tablet [id number] No Take 0.5 tablets (20 mg total) by mouth daily. Ernestene Mention, MD 10/22/2023 Active   gabapentin (NEURONTIN) 400 MG capsule [id number] No Take 1 capsule (400 mg total) by mouth 3 (three) times daily. Ernestene Mention, MD 10/22/2023 Active   insulin lispro (HUMALOG KWIKPEN) 100 UNIT/ML KwikPen [id number] No Inject 5 Units into the skin 3 (three) times daily before meals. Do not inject insulin if you are not going to eat meal.  Only take before a meal. Aura Dials T, NP 10/22/2023 Active Self           Med Note (LLOYD, CRYSTAL L   Mon Aug 24, 2023  9:59 AM) Verified 5 units with her   linaclotide (LINZESS) 145 MCG CAPS capsule [id number] No Take 145 mcg by mouth daily before breakfast. [provider] 10/22/2023 Active   losartan (COZAAR) 25 MG tablet [id number] No Take 0.5 tablets (12.5 mg total) by mouth daily. Ernestene Mention, MD 10/22/2023 Active   metFORMIN (GLUCOPHAGE) 1000 MG tablet [id number] No Take 1 tablet (1,000 mg total) by mouth 2 (two) times daily with a meal. Kadali, Renuka A, MD 10/22/2023 Active   metoprolol tartrate (LOPRESSOR) 25 MG tablet [id number] No Take 0.5 tablets (12.5 mg  total) by mouth 2 (two) times daily. Ernestene Mention, MD 10/22/2023 Active   montelukast (SINGULAIR) 10 MG tablet [id number] No Take 1 tablet (10 mg total) by mouth at bedtime. Ernestene Mention, MD 10/22/2023 Active   naloxone [facility]) nasal spray 4 mg/0.1 mL [id number] No Place 1 spray into the nose as needed for up to 365 doses (for opioid-induced respiratory depresssion). In case of emergency (overdose), spray once into each nostril. If no response within 3 minutes, repeat application and call 911. Delano Metz, MD Taking Active   Nebulizers (EASY AIR COMPRESSOR NEBULIZER) MISC [id number] No Use to inhaler nebulizer treatments at needed per instructions on nebulizer prescription [name], Dorie Rank, NP Taking Active Self  ondansetron (ZOFRAN) 4 MG tablet [id number] No Take 1 tablet (4 mg total) by mouth daily as needed for nausea or vomiting. Pilar Jarvis, MD Taking Active Self  OXYGEN [id number] No Inhale 2 L into the lungs at bedtime. [provider] Taking Active   PAIN MANAGEMENT INTRATHECAL, IT, PUMP [id number] No 1 each by Intrathecal route. Intrathecal (IT) medication:  Morphine 5.0 mg/ml, Bupivacaine 20.0 mg/ml,  Clonidine 100.0 mcg/ml Patient does  not remember current. Adjusted every 3 months at [facility] [provider] 10/22/2023 Active Self           Med Note Lyle San Apr 16, 2023  6:23 AM) Morphine, Bupivacaine and clonidine   pantoprazole (PROTONIX) 40 MG tablet [id number] No Take 1 tablet (40 mg total) by mouth daily. Kadali, Renuka A, MD 10/22/2023 Active   polyethylene glycol (MIRALAX / GLYCOLAX) 17 g packet [id number] No Take 17 g by mouth daily as needed for moderate constipation. Kadali, Renuka A, MD Taking Active   TRELEGY ELLIPTA 100-62.5-25 MCG/ACT AEPB [id number] No INHALE 1 PUFF INTO THE LUNGS DAILY . [address] DAY EXPIRATION AFTER OPENING FOIL TRAY [name], Jolene T, NP 10/22/2023 Active   TRESIBA FLEXTOUCH 100 UNIT/ML FlexTouch Pen [id number] No  INJECT 55 UNITS SUBCUTANEOUSLY AT BEDTIME [name], Jolene T, NP 10/22/2023 Active   TRULICITY 4.5 MG/0.5ML SOPN [id number] No INJECT 4.5MG  INTO THE SKIN ONCE WEEKLY AS DIRECTED  Patient taking differently: Wednesdays   [name], Jolene T, NP Taking Active   venlafaxine XR (EFFEXOR-XR) 150 MG 24 hr capsule [id number] No Take 1 capsule (150 mg total) by mouth daily. Kadali, Renuka A, MD 10/22/2023 Active             Recommendation:   Complete medical appointments as scheduled  Follow Up Plan:   Telephone follow up appointment on Dec 15, 2023 at 3:30   Roxie Cord [facility] RN Care Manager Direct Dial: [phone]  Fax: [phone] Website: [email or web address]

## 2023-11-17 NOTE — Progress Notes (Signed)
 BP 134/68   Pulse 94   Temp 98 F (36.7 C) (Oral)   Ht 4\' 10"  (1.473 m)   Wt 138 lb (62.6 kg)   LMP  (LMP Unknown)   SpO2 98%   BMI 28.84 kg/m    Subjective:    [name], female    DOB: 06-24-1951, 73 y.o.   MRN: [id number]  HPI: Jordan Chan is a 73 y.o. female  Chief Complaint  Patient presents with   Neck Pain    Patient states she has been having constant neck pain for the last 3 weeks.    NECK PAIN Has underlying torticollis which occasionally causes her pain. Became worse 3 weeks ago.  Feels most to right side which gets pulled with her torticollis.  She is having occasional numbness, like ants going up her neck, intermittently with the pain up to right side of face.  Is having more stressors recently due to her ex-husband living with her after cataracts surgery, had post surgical issues. This has increased pain to neck. When stretches neck it pulls into left shoulder which is new for her + can not get her head back.  Did take Klonopin in past short term for this.  Has not done PT in past for this.  Pain pump present for chronic lower back pain -- Morphine, Bupivacaine, and Clonidine + is currently on Gabapentin. Last A1c was 6.4%. Status: uncontrolled Treatments attempted:  stretching neck, rest, and heat  Relief with NSAIDs?:  No NSAIDs Taken Location:Right Duration:days Severity: 8/10 Quality: sharp, dull, aching, and throbbing Frequency: constant Radiation: ants to right side of face at times Aggravating factors: movement Alleviating factors: nothing Weakness:  no Paresthesias / decreased sensation:  yes  Fevers:  no   Relevant past medical, surgical, family and social history reviewed and updated as indicated. Interim medical history since our last visit reviewed. Allergies and medications reviewed and updated.  Review of Systems  Constitutional:  Negative for activity change, appetite change, diaphoresis, fatigue and unexpected  weight change.  Respiratory:  Negative for cough, chest tightness, shortness of breath and wheezing.   Cardiovascular:  Negative for chest pain, palpitations and leg swelling.  Gastrointestinal: Negative.   Neurological:  Positive for weakness and numbness (occasional to right side of face dependent on how to moves neck). Negative for dizziness, tremors, seizures, facial asymmetry, speech difficulty and light-headedness.  Psychiatric/Behavioral: Negative.      Per HPI unless specifically indicated above     Objective:    BP 134/68   Pulse 94   Temp 98 F (36.7 C) (Oral)   Ht 4\' 10"  (1.473 m)   Wt 138 lb (62.6 kg)   LMP  (LMP Unknown)   SpO2 98%   BMI 28.84 kg/m   Wt Readings from Last 3 Encounters:  11/17/23 138 lb (62.6 kg)  10/22/23 137 lb (62.1 kg)  10/08/23 132 lb (59.9 kg)    Physical Exam Vitals and nursing note reviewed.  Constitutional:      General: She is awake. She is not in acute distress.    Appearance: Normal appearance. She is well-developed and well-groomed. She is not ill-appearing or toxic-appearing.  HENT:     Head: Normocephalic.     Right Ear: Hearing and external ear normal.     Left Ear: Hearing and external ear normal.  Eyes:     General: Lids are normal.        Right eye: No discharge.  Left eye: No discharge.     Conjunctiva/sclera: Conjunctivae normal.     Pupils: Pupils are equal, round, and reactive to light.  Neck:     Thyroid: No thyromegaly.     Vascular: No carotid bruit.     Comments: Kyphosis present back. No rashes to neck area. Cardiovascular:     Rate and Rhythm: Normal rate and regular rhythm.     Heart sounds: Normal heart sounds. No murmur heard.    No gallop.  Pulmonary:     Effort: Pulmonary effort is normal. No accessory muscle usage or respiratory distress.     Breath sounds: Normal breath sounds.  Abdominal:     General: Bowel sounds are normal. There is no distension.     Palpations: Abdomen is soft.      Tenderness: There is no abdominal tenderness.  Musculoskeletal:     Cervical back: Neck supple. Torticollis present. No edema, signs of trauma, rigidity or crepitus. Pain with movement (flexion and extension + lateral movement pain) and muscular tenderness (along trapezius both sides) present. Decreased range of motion.     Right lower leg: No edema.     Left lower leg: No edema.  Lymphadenopathy:     Cervical: No cervical adenopathy.  Skin:    General: Skin is warm and dry.  Neurological:     Mental Status: She is alert and oriented to person, place, and time.     Cranial Nerves: Cranial nerves 2-12 are intact.     Sensory: Sensation is intact.     Motor: Motor function is intact.     Coordination: Coordination is intact.     Gait: Gait is intact.     Deep Tendon Reflexes: Reflexes are normal and symmetric.     Reflex Scores:      Brachioradialis reflexes are 2+ on the right side and 2+ on the left side.      Patellar reflexes are 2+ on the right side and 2+ on the left side. Psychiatric:        Attention and Perception: Attention normal.        Mood and Affect: Mood normal.        Speech: Speech normal.        Behavior: Behavior normal. Behavior is cooperative.        Thought Content: Thought content normal.     Results for orders placed or performed in visit on 11/04/23  [facility] DIABETES EYE EXAM   Collection Time: 10/05/23  8:21 AM  Result Value Ref Range   [facility] Diabetic Eye Exam No Retinopathy No Retinopathy      Assessment & Plan:   Problem List Items Addressed This Visit       Musculoskeletal and Integument   Torticollis - Primary (Chronic)   Chronic with recent increased pain to right side of neck. Her head at baseline tilts to left and she has chronic pain at baseline, but currently worse. No red flags on exam.  Would avoid Klonopin, which she has taken in past, due to Morphine with pain pump and Gabapentin use - concern for respiratory depression with her underlying lung  disease.  Recent A1c was stable, will send in steroid taper + recommend she continue heat at home.  Add on Voltaren gel OTC and massage into neck a few times daily.  Would benefit PT, will get into [facility] for ortho assessment and suspect they will add on PT -- she prefers to go there.  Relevant Orders   Ambulatory referral to Orthopedic Surgery     Follow up plan: Return for as scheduled in May.

## 2023-11-18 ENCOUNTER — Other Ambulatory Visit: Payer: Self-pay

## 2023-11-18 MED ORDER — PAIN MANAGEMENT IT PUMP REFILL: 1.0000 | Freq: Once | INTRATHECAL | 0 refills | Status: AC

## 2023-11-25 ENCOUNTER — Telehealth: Payer: Self-pay | Admitting: Family

## 2023-11-25 NOTE — Telephone Encounter (Signed)
 Called to confirm/remind patient of their [facility] on 11/26/[facility].   Appointment:   [x] Confirmed  [] Left mess   [] No answer/No voice mail  [] VM Full/unable to leave message  [] Phone not in service  Patient reminded to bring all medications and/or complete list.  Confirmed patient has transportation. Gave directions, instructed to utilize valet parking.

## 2023-11-25 NOTE — Progress Notes (Signed)
 [facility] Clinic Note     PCP: [name], NP (telemedicine visit 04/25) Cardiologist: Lonni Hanson, MD (last seen 11/24)  Chief Complaint: shortness of breath  HPI:  Jordan Chan is a 73 y/o female with a history of B12 deficiency, chronic pain syndrome s/p intrathecal pump, paroxysmal atrial fibrillation, stress-induced cardiomyopathy in the setting of pain pump malfunction and opioid withdrawal (2018), HTN, hyperlipidemia, PE (2021), DM, COPD, torticollis, GERD, OSA, breast cancer, chronic back pain,anemia and IBS.   Echo 12/11/20: EF 45% with Grade II DD Echo 09/11/21: EF 25-30% with Grade II DD Echo 10/31/21: EF 40-45% with Grade I DD  Admitted 04/15/23 due to worsening of left hip pain at the surgical site associated with increasing redness about the left hip surgical wound. Tachycardic. WBC 17.6. CXR normal.Thought to be septic due to left hip surgical wound infection. Treated with IV fluids, analgesics and empiric IV antibiotics. Transferred to [place]  [facility].    Admitted 08/09/23 due to acute respiratory distress found with sats in the 50 to 60% on RA. Found to have multiple PE's and sepsis due to aspiration pneumonia. Initially intubated. Completed antibiotics & successfully extubated. Underwent swallowing evaluation. Heparin  stopped and transitioned to eliquis . Anemia stable. Echo 08/11/23: EF 25-30% with Grade I DD  She presents today for a HF follow-up visit with minimal shortness of breath. Has associated fatigue, occasional dizziness, nausea, neck pain and difficulty sleeping well due to neck pain. She does feel like her SOB is improving. Denies chest pain, cough, palpitations, abdominal distention or pedal edema. Does note a gradual weight gain due to increased appetite. Her neck pain is worsening due to worsening torticollis.   Wearing oxygen  at 2L around the clock. Quit smoking 19 years ago (~2006) when a grandchild was going to be born.  Denies alcohol  use. Not adding salt to her food. Weighing daily   Previous cardiac studies:  LHC 04/28/17:  No angiographically significant coronary artery disease. Severely reduced LV contraction (LVEF 25-30%) with mid and apical akinesis, consistent with Takotsubo (stress-induced) cardiomyopathy. Mildly elevated left ventricular filling pressure.  ROS: All systems negative except what is listed in HPI, PMH and Problem List  Past Medical History:  Diagnosis Date   Arthritis    Asthma    Atrial fibrillation (HCC)    Breast cancer (HCC) 1998   right breast ca with mastectomy and chemotherapy and radiation   Bronchitis    CHF (congestive heart failure) (HCC)    with Morphine  withdrawal   COPD (chronic obstructive pulmonary disease) (HCC)    Diabetes mellitus without complication (HCC)    Diverticulitis    diverticulosis also   Dyspnea    Endometriosis    GERD (gastroesophageal reflux disease)    History of shingles 2000-2005   Hypercholesteremia    Hypertension    IBS (irritable bowel syndrome)    Low back pain    a. Implanted morphine /bupivicaine/clonidine  pump.   Neuropathy    Orthopnea    Oxygen  dependent    uses at night   Personal history of chemotherapy    Personal history of radiation therapy    Pneumonia    pneumonia 5-6 times, history of bronchitis also   Scoliosis    Sleep apnea    does not use cpap   Stroke (HCC) 2010   TIA, 10 years ago   Withdrawal from sedative drug (HCC)    withdrawal from morphine  when pump batteries died    Current Outpatient Medications  Medication  Sig Dispense Refill   ACCU-CHEK AVIVA PLUS test strip TEST THREE TIMES DAILY 300 strip 3   Accu-Chek Softclix Lancets lancets TEST BLOOD SUGAR THREE TIMES DAILY 300 each 3   albuterol  (PROVENTIL ) (2.5 MG/3ML) 0.083% nebulizer solution Take 3 mLs (2.5 mg total) by nebulization every 4 (four) hours as needed for wheezing or shortness of breath. 75 mL 1   albuterol  (VENTOLIN  HFA) 108  (90 Base) MCG/ACT inhaler Inhale 2 puffs into the lungs every 6 (six) hours as needed for wheezing or shortness of breath. 1 each 1   alum & mag hydroxide-simeth (MAALOX/MYLANTA) 200-200-20 MG/5ML suspension Take 30 mLs by mouth every 6 (six) hours as needed for indigestion or heartburn. 300 mL 1   apixaban  (ELIQUIS ) 5 MG TABS tablet Take 1 tablet (5 mg total) by mouth 2 (two) times daily. 60 tablet 0   atorvastatin  (LIPITOR) 40 MG tablet Take 1 tablet (40 mg total) by mouth daily. 30 tablet 1   Blood Glucose Monitoring Suppl (TRUE METRIX METER) w/Device KIT Use to check blood sugar 4 times a day 1 kit 4   busPIRone  (BUSPAR ) 5 MG tablet Take 1 tablet (5 mg total) by mouth 2 (two) times daily. 30 tablet 1   cholecalciferol  (VITAMIN D3) 25 MCG (1000 UNIT) tablet Take 1,000 Units by mouth daily.     Continuous Glucose Receiver (FREESTYLE LIBRE 2 READER) DEVI To check blood sugars 3-4 times daily due to insulin  use. DX E11.40 2 each 5   Continuous Glucose Sensor (FREESTYLE LIBRE 2 SENSOR) MISC USE 1 SENSOR TO CHECK BLOOD SUGARS 3-4 TIMES DAILY DUE TO INSULIN  USE 2 each 5   Cyanocobalamin  1000 MCG/ML KIT Inject 1,000 mcg as directed every 30 (thirty) days. Next injection 3/21     cyclobenzaprine  (FLEXERIL ) 5 MG tablet Take 5 mg by mouth daily as needed for muscle spasms.     dapagliflozin  propanediol (FARXIGA ) 10 MG TABS tablet Take 1 tablet (10 mg total) by mouth daily before breakfast. 90 tablet 3   docusate sodium  (COLACE) 100 MG capsule Take 1 capsule (100 mg total) by mouth 2 (two) times daily as needed for mild constipation. 30 capsule 0   feeding supplement (ENSURE ENLIVE / ENSURE PLUS) LIQD Take 237 mLs by mouth 3 (three) times daily between meals. 237 mL 12   ferrous sulfate  325 (65 FE) MG EC tablet Take 1 tablet (325 mg total) by mouth 2 (two) times daily. 60 tablet 1   folic acid  (FOLVITE ) 1 MG tablet TAKE 1 TABLET EVERY DAY 90 tablet 0   furosemide  (LASIX ) 40 MG tablet Take 0.5 tablets (20 mg  total) by mouth daily. 30 tablet 1   gabapentin  (NEURONTIN ) 400 MG capsule Take 1 capsule (400 mg total) by mouth 3 (three) times daily. 90 capsule 1   insulin  lispro (HUMALOG  KWIKPEN) 100 UNIT/ML KwikPen Inject 5 Units into the skin 3 (three) times daily before meals. Do not inject insulin  if you are not going to eat meal.  Only take before a meal. 15 mL 3   linaclotide (LINZESS) 145 MCG CAPS capsule Take 145 mcg by mouth daily before breakfast.     losartan  (COZAAR ) 25 MG tablet Take 0.5 tablets (12.5 mg total) by mouth daily. 30 tablet 1   metFORMIN  (GLUCOPHAGE ) 1000 MG tablet Take 1 tablet (1,000 mg total) by mouth 2 (two) times daily with a meal. 60 tablet 1   metoprolol  tartrate (LOPRESSOR ) 25 MG tablet Take 0.5 tablets (12.5 mg total) by mouth  2 (two) times daily. 30 tablet 1   montelukast  (SINGULAIR ) 10 MG tablet Take 1 tablet (10 mg total) by mouth at bedtime. 30 tablet 0   naloxone  (NARCAN ) nasal spray 4 mg/0.1 mL Place 1 spray into the nose as needed for up to 365 doses (for opioid-induced respiratory depresssion). In case of emergency (overdose), spray once into each nostril. If no response within 3 minutes, repeat application and call 911. 1 each 1   Nebulizers (EASY AIR COMPRESSOR NEBULIZER) MISC Use to inhaler nebulizer treatments at needed per instructions on nebulizer prescription 1 each 0   ondansetron  (ZOFRAN ) 4 MG tablet Take 1 tablet (4 mg total) by mouth daily as needed for nausea or vomiting. 30 tablet 1   OXYGEN  Inhale 2 L into the lungs at bedtime.     PAIN MANAGEMENT INTRATHECAL, IT, PUMP 1 each by Intrathecal route. Intrathecal (IT) medication:  Morphine  5.0 mg/ml, Bupivacaine 20.0 mg/ml,  Clonidine  100.0 mcg/ml Patient does not remember current. Adjusted every 3 months at [facility]     pantoprazole  (PROTONIX ) 40 MG tablet Take 1 tablet (40 mg total) by mouth daily. 30 tablet 1   polyethylene glycol (MIRALAX  / GLYCOLAX ) 17 g packet Take 17 g by mouth daily as  needed for moderate constipation. 10 packet 0   predniSONE  (STERAPRED UNI-PAK 21 TAB) 10 MG (21) TBPK tablet Take as instructed on packaging. 21 each 0   TRELEGY ELLIPTA  100-62.5-25 MCG/ACT AEPB INHALE 1 PUFF INTO THE LUNGS DAILY . 42 DAY EXPIRATION AFTER OPENING FOIL TRAY 180 each 3   TRESIBA  FLEXTOUCH 100 UNIT/ML FlexTouch Pen INJECT 55 UNITS SUBCUTANEOUSLY AT BEDTIME 30 mL 0   TRULICITY  4.5 MG/0.5ML SOPN INJECT 4.5MG  INTO THE SKIN ONCE WEEKLY AS DIRECTED (Patient taking differently: Wednesdays) 12 mL 0   venlafaxine  XR (EFFEXOR -XR) 150 MG 24 hr capsule Take 1 capsule (150 mg total) by mouth daily. 30 capsule 0   No current facility-administered medications for this visit.    Allergies  Allergen Reactions   Other Palpitations    IV steroids   Pain Patch [Menthol] Anaphylaxis   Jardiance  [Empagliflozin ] Other (See Comments)    Yeast infection & dry mouth   Avelox  [Moxifloxacin  Hcl In Nacl] Other (See Comments)    Upset stomach   Doxycycline  Diarrhea   Erythromycin  Nausea Only and Other (See Comments)    Can take a Z-Pak just fine Other reaction(s): Other (See Comments) Can take Z-Pak  Can take a Z-Pak just fine   Fentanyl  Nausea Only and Rash   Moxifloxacin  Hcl Other (See Comments)    Upset stomach Upset stomach   Oxycontin [Oxycodone] Hives   Ozempic [Semaglutide] Nausea Only      Social History   Socioeconomic History   Marital status: Unknown    Spouse name: Darina   Number of children: 2   Years of education: Not on file   Highest education level: High school graduate  Occupational History   Occupation: retired  Tobacco Use   Smoking status: Former    Current packs/day: 0.00    Average packs/day: 1 pack/day for 30.0 years (30.0 ttl pk-yrs)    Types: Cigarettes    Start date: 04/30/1974    Quit date: 04/30/2004    Years since quitting: 19.5   Smokeless tobacco: Never  Vaping Use   Vaping status: Never Used  Substance and Sexual Activity   Alcohol  use: No    Drug use: No   Sexual activity: Not Currently  Birth control/protection: Post-menopausal  Other Topics Concern   Not on file  Social History Narrative   ** Merged History Encounter **       Social Drivers of Health   Financial Resource Strain: Low Risk  (11/17/2023)   Overall Financial Resource Strain (CARDIA)    Difficulty of Paying Living Expenses: Not very hard  Food Insecurity: No Food Insecurity (11/17/2023)   Hunger Vital Sign    Worried About Running Out of Food in the Last Year: Never true    Ran Out of Food in the Last Year: Never true  Transportation Needs: No Transportation Needs (11/17/2023)   PRAPARE - Administrator, Civil Service (Medical): No    Lack of Transportation (Non-Medical): No  Physical Activity: Inactive (12/23/2022)   Exercise Vital Sign    Days of Exercise per Week: 0 days    Minutes of Exercise per Session: 0 min  Stress: No Stress Concern Present (12/23/2022)   [employer] of Occupational Health - Occupational Stress Questionnaire    Feeling of Stress : Only a little  Social Connections: Moderately Integrated (11/17/2023)   Social Connection and Isolation Panel [NHANES]    Frequency of Communication with Friends and Family: More than three times a week    Frequency of Social Gatherings with Friends and Family: More than three times a week    Attends Religious Services: More than 4 times per year    Active Member of [employer] or Organizations: Yes    Attends Banker Meetings: More than 4 times per year    Marital Status: Separated  Intimate Partner Violence: Not At Risk (11/17/2023)   Humiliation, Afraid, Rape, and Kick questionnaire    Fear of Current or Ex-Partner: No    Emotionally Abused: No    Physically Abused: No    Sexually Abused: No      Family History  Problem Relation Age of Onset   Cancer Mother 46       lung   Thyroid  disease Mother    Cancer Father 28       Pancreatic   Hypertension Sister    Cancer  Sister        cancer on face   Hyperlipidemia Sister    Osteoporosis Maternal Grandmother    Cancer Paternal Grandmother        colon   Arthritis Paternal Grandfather    Hyperlipidemia Son    Seizures Son    Breast cancer Neg Hx    Vitals:   11/26/23 1326  BP: (!) 130/58  Pulse: 88  SpO2: 95%  Weight: 143 lb (64.9 kg)   Wt Readings from Last 3 Encounters:  11/26/23 143 lb (64.9 kg)  11/17/23 138 lb (62.6 kg)  10/22/23 137 lb (62.1 kg)   Lab Results  Component Value Date   CREATININE 1.04 (H) 10/22/2023   CREATININE 1.25 (H) 09/16/2023   CREATININE 1.29 (H) 09/04/2023    PHYSICAL EXAM:  General: Well appearing. No resp difficulty HEENT: normal Neck: supple, no JVD, head is being pulled to the left due to torticollis Cor: Regular rhythm, rate. No rubs, gallops or murmurs Lungs: clear Abdomen: soft, nontender, nondistended. Extremities: no cyanosis, clubbing, rash, edema Neuro: alert & oriented X 3. Moves all 4 extremities w/o difficulty. Affect pleasant   ECG: not done   ASSESSMENT & PLAN:  1: NICM with reduced ejection fraction- - suspect due to stress induced after pain pump malfunction & opioid withdrawal in 2018 - NYHA  Chan II - euvolemic - weighing daily; reminded to call for overnight weight gain of > 2 pounds or weekly weight gain of > 5 pounds - weight up 6 pounds from last visit here 1 month ago - Echo 12/11/20: EF 45% with Grade II DD - Echo 09/11/21: EF 25-30% with Grade II DD - Echo 10/31/21: EF 40-45% with Grade I DD - Echo 08/11/23: EF 25-30% with Grade I DD - continue farxiga  10mg  daily - continue furosemide  20mg  daily - continue losartan  12.5mg  daily; consider transitioning to entresto  if BP maintains - change metoprolol  to succinate 25mg  daily - discussed adding spironolactone if BP maintains watching K+ closely as she's been both hypo/ hyperkalemic in the past; will get lab results back first - BMET today - not adding salt to her food - BNP  08/09/23 was 241.9  2: HTN- - BP 130/58 - had telemedicine visit with PCP Felton) 04/25 - BMET 10/22/23 reviewed: sodium 140, potassium 4.7, creatinine 1.04 & GFR 57 - BMET today  3: DM- - A1c 06/22/23 was 7.4% - continue insulin  - continue metformin  1000mg  BID  4: COPD- - saw pulmonology Alica) 03/25 - wearing oxygen  at 2L around the clock but does take it off at times at home  5: Atrial fibrillation- - saw cardiology (End) 11/24 - continue apixaban  5mg  BID - change metoprolol  to succinate 25mg  daily; if AF episodes increase, may have to go back to tartrate version  6: Chronic pain- - follows with pain clinic - has chronic intrathecal pain pump - continue gabapentin  400mg  TID  7: Anemia- - Hg 09/16/23 reviewed and was 10.7 - saw GI Elodie) 03/25 - continue ferrous sulfate  325mg  BID    Return in 1 month, sooner if needed.   Jordan DELENA Class, FNP 11/25/23

## 2023-11-26 ENCOUNTER — Ambulatory Visit: Attending: Family | Admitting: Family

## 2023-11-26 ENCOUNTER — Encounter: Payer: Self-pay | Admitting: Family

## 2023-11-26 VITALS — BP 130/58 | HR 88 | Wt 143.0 lb

## 2023-11-26 DIAGNOSIS — D649 Anemia, unspecified: Secondary | ICD-10-CM | POA: Insufficient documentation

## 2023-11-26 DIAGNOSIS — Z7901 Long term (current) use of anticoagulants: Secondary | ICD-10-CM | POA: Diagnosis not present

## 2023-11-26 DIAGNOSIS — I110 Hypertensive heart disease with heart failure: Secondary | ICD-10-CM | POA: Insufficient documentation

## 2023-11-26 DIAGNOSIS — Z79899 Other long term (current) drug therapy: Secondary | ICD-10-CM | POA: Diagnosis not present

## 2023-11-26 DIAGNOSIS — Z87891 Personal history of nicotine dependence: Secondary | ICD-10-CM | POA: Diagnosis not present

## 2023-11-26 DIAGNOSIS — Z7985 Long-term (current) use of injectable non-insulin antidiabetic drugs: Secondary | ICD-10-CM | POA: Insufficient documentation

## 2023-11-26 DIAGNOSIS — Z7984 Long term (current) use of oral hypoglycemic drugs: Secondary | ICD-10-CM | POA: Insufficient documentation

## 2023-11-26 DIAGNOSIS — I480 Paroxysmal atrial fibrillation: Secondary | ICD-10-CM | POA: Diagnosis not present

## 2023-11-26 DIAGNOSIS — I10 Essential (primary) hypertension: Secondary | ICD-10-CM

## 2023-11-26 DIAGNOSIS — E1140 Type 2 diabetes mellitus with diabetic neuropathy, unspecified: Secondary | ICD-10-CM | POA: Diagnosis not present

## 2023-11-26 DIAGNOSIS — J4489 Other specified chronic obstructive pulmonary disease: Secondary | ICD-10-CM | POA: Diagnosis not present

## 2023-11-26 DIAGNOSIS — I4819 Other persistent atrial fibrillation: Secondary | ICD-10-CM

## 2023-11-26 DIAGNOSIS — Z794 Long term (current) use of insulin: Secondary | ICD-10-CM | POA: Diagnosis not present

## 2023-11-26 DIAGNOSIS — Z86711 Personal history of pulmonary embolism: Secondary | ICD-10-CM | POA: Insufficient documentation

## 2023-11-26 DIAGNOSIS — M25552 Pain in left hip: Secondary | ICD-10-CM | POA: Insufficient documentation

## 2023-11-26 DIAGNOSIS — I428 Other cardiomyopathies: Secondary | ICD-10-CM | POA: Diagnosis present

## 2023-11-26 DIAGNOSIS — I5020 Unspecified systolic (congestive) heart failure: Secondary | ICD-10-CM | POA: Diagnosis not present

## 2023-11-26 DIAGNOSIS — Z9981 Dependence on supplemental oxygen: Secondary | ICD-10-CM | POA: Diagnosis not present

## 2023-11-26 DIAGNOSIS — G894 Chronic pain syndrome: Secondary | ICD-10-CM | POA: Diagnosis not present

## 2023-11-26 DIAGNOSIS — J449 Chronic obstructive pulmonary disease, unspecified: Secondary | ICD-10-CM | POA: Diagnosis not present

## 2023-11-26 DIAGNOSIS — E119 Type 2 diabetes mellitus without complications: Secondary | ICD-10-CM

## 2023-11-26 DIAGNOSIS — K219 Gastro-esophageal reflux disease without esophagitis: Secondary | ICD-10-CM | POA: Insufficient documentation

## 2023-11-26 DIAGNOSIS — E785 Hyperlipidemia, unspecified: Secondary | ICD-10-CM | POA: Insufficient documentation

## 2023-11-26 DIAGNOSIS — D508 Other iron deficiency anemias: Secondary | ICD-10-CM

## 2023-11-26 DIAGNOSIS — G4733 Obstructive sleep apnea (adult) (pediatric): Secondary | ICD-10-CM | POA: Diagnosis not present

## 2023-11-26 MED ORDER — METOPROLOL SUCCINATE ER 25 MG PO TB24: 25.0000 mg | ORAL_TABLET | Freq: Every day | ORAL | 11 refills | Status: DC

## 2023-11-26 NOTE — Patient Instructions (Addendum)
 Lab Work:  Go DOWN to LOWER LEVEL (LL) to have your blood work completed inside of [employer] office.  We will only call you if the results are abnormal or if the provider would like to make medication changes.   Special Instructions // Education:  If you receive a satisfaction survey regarding the Heart Failure Clinic, please take the time to fill it out. This way we can continue to provide excellent care and make any changes that need to be made.   Follow-Up in: 1 month with Shawnee Dellen, FNP.  At the [facility], you and your health needs are our priority. We have a designated team specialized in the treatment of Heart Failure. This Care Team includes your primary Heart Failure Specialized Cardiologist (physician), Advanced Practice Providers (APPs- Physician Assistants and Nurse Practitioners), and Pharmacist who all work together to provide you with the care you need, when you need it.   You may see any of the following providers on your designated Care Team at your next follow up:  Dr. Jules Oar Dr. Peder Bourdon Dr. Alwin Baars Dr. Judyth Nunnery Shawnee Dellen, FNP Bevely Brush, RPH-CPP  Please be sure to bring in all your medications bottles to every appointment.   Need to Contact Us :  If you have any questions or concerns before your next appointment please send us  a message through [place] or call our office at [phone].    TO LEAVE A MESSAGE FOR THE NURSE SELECT OPTION 2, PLEASE LEAVE A MESSAGE INCLUDING: YOUR NAME DATE OF BIRTH CALL BACK NUMBER REASON FOR CALL**this is important as we prioritize the call backs  YOU WILL RECEIVE A CALL BACK THE SAME DAY AS LONG AS YOU CALL BEFORE 4:00 PM

## 2023-11-27 LAB — BASIC METABOLIC PANEL WITH GFR
BUN/Creatinine Ratio: 11 — ABNORMAL LOW (ref 12–28)
BUN: 12 mg/dL (ref 8–27)
CO2: 27 mmol/L (ref 20–29)
Calcium: 9.5 mg/dL (ref 8.7–10.3)
Chloride: 101 mmol/L (ref 96–106)
Creatinine, Ser: 1.08 mg/dL — ABNORMAL HIGH (ref 0.57–1.00)
Glucose: 60 mg/dL — ABNORMAL LOW (ref 70–99)
Potassium: 5 mmol/L (ref 3.5–5.2)
Sodium: 145 mmol/L — ABNORMAL HIGH (ref 134–144)
eGFR: 54 mL/min/{1.73_m2} — ABNORMAL LOW (ref >59)

## 2023-12-10 ENCOUNTER — Other Ambulatory Visit: Payer: Self-pay

## 2023-12-13 NOTE — Patient Instructions (Incomplete)
Be Involved in Caring For Your Health:  Taking Medications When medications are taken as directed, they can greatly improve your health. But if they are not taken as prescribed, they may not work. In some cases, not taking them correctly can be harmful. To help ensure your treatment remains effective and safe, understand your medications and how to take them. Bring your medications to each visit for review by your provider.  Your lab results, notes, and after visit summary will be available on My Chart. We strongly encourage you to use this feature. If lab results are abnormal the clinic will contact you with the appropriate steps. If the clinic does not contact you assume the results are satisfactory. You can always view your results on My Chart. If you have questions regarding your health or results, please contact the clinic during office hours. You can also ask questions on My Chart.  We at [facility] are grateful that you chose [place] to provide your care. We strive to provide evidence-based and compassionate care and are always looking for feedback. If you get a survey from the clinic please complete this so we can hear your opinions.  Diabetes Mellitus and Exercise Regular exercise is important for your health, especially if you have diabetes mellitus. Exercise is not just about losing weight. It can also help you increase muscle strength and bone density and reduce body fat and stress. This can help your level of endurance and make you more fit and flexible. Why should I exercise if I have diabetes? Exercise has many benefits for people with diabetes. It can: Help lower and control your blood sugar (glucose). Help your body respond better and become more sensitive to the hormone insulin. Reduce how much insulin your body needs. Lower your risk for heart disease by: Lowering how much "bad" cholesterol and triglycerides you have in your body. Increasing how much "good" cholesterol  you have in your body. Lowering your blood pressure. Lowering your blood glucose levels. What is my activity plan? Your health care provider or an expert trained in diabetes care (certified diabetes educator) can help you make an activity plan. This plan can help you find the type of exercise that works for you. It may also tell you how often to exercise and for how long. Be sure to: Get at least 150 minutes of medium-intensity or high-intensity exercise each week. This may involve brisk walking, biking, or water aerobics. Do stretching and strengthening exercises at least 2 times a week. This may involve yoga or weight lifting. Spread out your activity over at least 3 days of the week. Get some form of physical activity each day. Do not go more than 2 days in a row without some kind of activity. Avoid being inactive for more than 30 minutes at a time. Take frequent breaks to walk or stretch. Choose activities that you enjoy. Set goals that you know you can accomplish. Start slowly and increase the intensity of your exercise over time. How do I manage my diabetes during exercise?  Monitor your blood glucose Check your blood glucose before and after you exercise. If your blood glucose is 240 mg/dL (40.9 mmol/L) or higher before you exercise, check your urine for ketones. These are chemicals created by the liver. If you have ketones in your urine, do not exercise until your blood glucose returns to normal. If your blood glucose is 100 mg/dL (5.6 mmol/L) or lower, eat a snack that has 15-20 grams of carbohydrate in  it. Check your blood glucose 15 minutes after the snack to make sure that your level is above 100 mg/dL (5.6 mmol/L) before you start to exercise. Your risk for low blood glucose (hypoglycemia) goes up during and after exercise. Know the symptoms of this condition and how to treat it. Follow these instructions at home: Keep a carbohydrate snack on hand for use before, during, and after  exercise. This can help prevent or treat hypoglycemia. Avoid injecting insulin into parts of your body that are going to be used during exercise. This may include: Your arms, when you are going to play tennis. Your legs, when you are about to go jogging. Keep track of your exercise habits. This can help you and your health care provider watch and adjust your activity plan. Write down: What you eat before and after you exercise. Blood glucose levels before and after you exercise. The type and amount of exercise you do. Talk to your health care provider before you start a new activity. They may need to: Make sure that the activity is safe for you. Adjust your insulin, other medicines, and food that you eat. Drink water while you exercise. This can stop you from losing too much water (dehydration). It can also prevent problems caused by having a lot of heat in your body (heat stroke). Where to find more information American Diabetes Association: diabetes.org Association of Diabetes Care & Education Specialists: diabeteseducator.org This information is not intended to replace advice given to you by your health care provider. Make sure you discuss any questions you have with your health care provider. Document Revised: 01/08/2022 Document Reviewed: 01/08/2022 Elsevier Patient Education  2024 [employer].

## 2023-12-14 ENCOUNTER — Ambulatory Visit

## 2023-12-14 DIAGNOSIS — E538 Deficiency of other specified B group vitamins: Secondary | ICD-10-CM | POA: Diagnosis not present

## 2023-12-14 MED ORDER — CYANOCOBALAMIN 1000 MCG/ML IJ SOLN
1000.0000 ug | Freq: Once | INTRAMUSCULAR | Status: AC
Start: 2023-12-14 — End: 2023-12-14
  Administered 2023-12-14: 1000 ug via INTRAMUSCULAR

## 2023-12-14 NOTE — Progress Notes (Signed)
 Patient is in office today for a nurse visit for B12 Injection. Patient Injection was given in the  Right deltoid. Patient tolerated injection well.

## 2023-12-15 ENCOUNTER — Ambulatory Visit: Attending: Pain Medicine | Admitting: Pain Medicine

## 2023-12-15 ENCOUNTER — Other Ambulatory Visit: Payer: Self-pay

## 2023-12-15 ENCOUNTER — Telehealth: Payer: Self-pay

## 2023-12-15 ENCOUNTER — Encounter: Payer: Self-pay | Admitting: Pain Medicine

## 2023-12-15 VITALS — BP 126/58 | HR 102 | Temp 96.6°F | Resp 20 | Ht <= 58 in | Wt 142.0 lb

## 2023-12-15 DIAGNOSIS — M5410 Radiculopathy, site unspecified: Secondary | ICD-10-CM | POA: Insufficient documentation

## 2023-12-15 DIAGNOSIS — M25552 Pain in left hip: Secondary | ICD-10-CM | POA: Insufficient documentation

## 2023-12-15 DIAGNOSIS — Z79899 Other long term (current) drug therapy: Secondary | ICD-10-CM | POA: Diagnosis present

## 2023-12-15 DIAGNOSIS — Z978 Presence of other specified devices: Secondary | ICD-10-CM | POA: Diagnosis present

## 2023-12-15 DIAGNOSIS — M545 Low back pain, unspecified: Secondary | ICD-10-CM | POA: Diagnosis present

## 2023-12-15 DIAGNOSIS — M79604 Pain in right leg: Secondary | ICD-10-CM | POA: Insufficient documentation

## 2023-12-15 DIAGNOSIS — M51372 Other intervertebral disc degeneration, lumbosacral region with discogenic back pain and lower extremity pain: Secondary | ICD-10-CM | POA: Insufficient documentation

## 2023-12-15 DIAGNOSIS — Z451 Encounter for adjustment and management of infusion pump: Secondary | ICD-10-CM | POA: Insufficient documentation

## 2023-12-15 DIAGNOSIS — Z79891 Long term (current) use of opiate analgesic: Secondary | ICD-10-CM | POA: Diagnosis present

## 2023-12-15 DIAGNOSIS — G894 Chronic pain syndrome: Secondary | ICD-10-CM | POA: Diagnosis present

## 2023-12-15 DIAGNOSIS — Z7901 Long term (current) use of anticoagulants: Secondary | ICD-10-CM | POA: Diagnosis present

## 2023-12-15 DIAGNOSIS — E1142 Type 2 diabetes mellitus with diabetic polyneuropathy: Secondary | ICD-10-CM | POA: Diagnosis present

## 2023-12-15 DIAGNOSIS — M79605 Pain in left leg: Secondary | ICD-10-CM | POA: Diagnosis present

## 2023-12-15 DIAGNOSIS — G8929 Other chronic pain: Secondary | ICD-10-CM | POA: Insufficient documentation

## 2023-12-15 NOTE — Patient Instructions (Signed)
 Thank you for allowing the Complex Care Management team to participate in your care. It was great speaking with you today!  Our next outreach will be via telephone on January 15, 2024 at 2 pm. Please do not hesitate to contact me if you require assistance prior to our next outreach.    Roxie Cord [facility] RN Care Manager Direct Dial : [phone]  Fax: [phone] Website: [email or web address]

## 2023-12-15 NOTE — Patient Outreach (Signed)
 Complex Care Management   Visit Note  12/15/2023  Name:  Jordan Chan MRN: [id number] DOB: 26-May-1951  Situation: Referral received for Complex Care Management related to {Criteria:32550} I obtained verbal consent from {CHL AMB Patient/Caregiver:28184}.  Visit completed with ***  {VISIT LOCATION:32553}  Background:   Past Medical History:  Diagnosis Date   Arthritis    Asthma    Atrial fibrillation (HCC)    Breast cancer (HCC) 1998   right breast ca with mastectomy and chemotherapy and radiation   Bronchitis    CHF (congestive heart failure) (HCC)    "with Morphine  withdrawal"   COPD (chronic obstructive pulmonary disease) (HCC)    Diabetes mellitus without complication (HCC)    Diverticulitis    diverticulosis also   Dyspnea    Endometriosis    GERD (gastroesophageal reflux disease)    History of shingles 2000-2005   Hypercholesteremia    Hypertension    IBS (irritable bowel syndrome)    Low back pain    a. Implanted morphine /bupivicaine/clonidine  pump.   Neuropathy    Orthopnea    Oxygen  dependent    uses at night   Personal history of chemotherapy    Personal history of radiation therapy    Pneumonia    pneumonia 5-6 times, history of bronchitis also   Scoliosis    Sleep apnea    does not use cpap   Stroke (HCC) 2010   TIA, 10 years ago   Withdrawal from sedative drug (HCC)    withdrawal from morphine  when pump batteries died    Assessment: Patient Reported Symptoms:  Cognitive        Neurological      HEENT        Cardiovascular   Weight: 142 lb (64.4 kg)  Respiratory      Endocrine      Gastrointestinal        Genitourinary      Integumentary      Musculoskeletal          Psychosocial              12/15/2023   12:57 PM  Depression screen PHQ 2/9  Decreased Interest 0  Down, Depressed, Hopeless 0  PHQ - 2 Score 0    Vitals:   12/15/23 1530  BP: (!) 126/58    Medications Reviewed Today     Reviewed by Roxie Cord, RN (Registered Nurse) on 12/15/23 at 1537  Med List Status: <None>   Medication Order Taking? Sig Documenting Provider Last Dose Status Informant  ACCU-CHEK AVIVA PLUS test strip [id number]  TEST THREE TIMES DAILY [name], Jolene T, NP  Active Self           Med Note Welford Haley, CRYSTAL L   Mon Aug 24, 2023  9:57 AM) Uses Libre device   Accu-Chek Softclix Lancets lancets [id number]  TEST BLOOD SUGAR THREE TIMES DAILY [name], Jolene T, NP  Active Self  albuterol  (PROVENTIL ) (2.5 MG/3ML) 0.083% nebulizer solution [id number]  Take 3 mLs (2.5 mg total) by nebulization every 4 (four) hours as needed for wheezing or shortness of breath. Kadali, Renuka A, MD  Active            Med Note (LLOYD, CRYSTAL L   Mon Aug 24, 2023 10:07 AM) [place] recently can't find neb machine   albuterol  (VENTOLIN  HFA) 108 (90 Base) MCG/ACT inhaler [id number] Yes Inhale 2 puffs into the lungs every 6 (six) hours as needed for wheezing or shortness of breath.  Kadali, Renuka A, MD Taking Active   alum & mag hydroxide-simeth (MAALOX/MYLANTA) 200-200-20 MG/5ML suspension [id number]  Take 30 mLs by mouth every 6 (six) hours as needed for indigestion or heartburn. Kadali, Renuka A, MD  Active   apixaban  (ELIQUIS ) 5 MG TABS tablet [id number]  Take 1 tablet (5 mg total) by mouth 2 (two) times daily. Kadali, Renuka A, MD  Active   atorvastatin  (LIPITOR) 40 MG tablet [id number]  Take 1 tablet (40 mg total) by mouth daily. Kadali, Renuka A, MD  Active   Blood Glucose Monitoring Suppl (TRUE METRIX METER) w/Device KIT [id number]  Use to check blood sugar 4 times a day [name], Jolene T, NP  Active Self  busPIRone  (BUSPAR ) 5 MG tablet [id number]  Take 1 tablet (5 mg total) by mouth 2 (two) times daily. Kadali, Renuka A, MD  Active   cholecalciferol  (VITAMIN D3) 25 MCG (1000 UNIT) tablet [id number]  Take 1,000 Units by mouth daily. [provider]  Active Self  Continuous Glucose Receiver (FREESTYLE LIBRE 2 READER) DEVI [id number]  To check  blood sugars 3-4 times daily due to insulin  use. DX E11.40 Doran Galloway T, NP  Active Self  Continuous Glucose Sensor (FREESTYLE LIBRE 2 SENSOR) MISC [id number]  USE 1 SENSOR TO CHECK BLOOD SUGARS 3-4 TIMES DAILY DUE TO INSULIN  USE [name], Jolene T, NP  Active   Cyanocobalamin  1000 MCG/ML KIT [id number]  Inject 1,000 mcg as directed every 30 (thirty) days. Next injection 3/21 [provider]  Active Self  cyclobenzaprine  (FLEXERIL ) 5 MG tablet [id number]  Take 5 mg by mouth daily as needed for muscle spasms. [provider]  Active   dapagliflozin  propanediol (FARXIGA ) 10 MG TABS tablet [id number]  Take 1 tablet (10 mg total) by mouth daily before breakfast. Shawnee Dellen A, FNP  Active   docusate sodium  (COLACE) 100 MG capsule [id number]  Take 1 capsule (100 mg total) by mouth 2 (two) times daily as needed for mild constipation. Kadali, Renuka A, MD  Active   feeding supplement (ENSURE ENLIVE / ENSURE PLUS) LIQD [id number]  Take 237 mLs by mouth 3 (three) times daily between meals. Kadali, Renuka A, MD  Active   ferrous sulfate  325 (65 FE) MG EC tablet [id number]  Take 1 tablet (325 mg total) by mouth 2 (two) times daily. Kadali, Renuka A, MD  Active   folic acid  (FOLVITE ) 1 MG tablet [id number]  TAKE 1 TABLET EVERY DAY Nelda Balsam, NP  Active Self  furosemide  (LASIX ) 40 MG tablet [id number]  Take 0.5 tablets (20 mg total) by mouth daily. Kadali, Renuka A, MD  Active   gabapentin  (NEURONTIN ) 400 MG capsule [id number]  Take 1 capsule (400 mg total) by mouth 3 (three) times daily. Kadali, Renuka A, MD  Active   HYDROcodone -acetaminophen  (NORCO/VICODIN) 5-325 MG tablet [id number]  Take 1 tablet by mouth every 4 (four) hours as needed. [provider]  Active   insulin  lispro (HUMALOG  KWIKPEN) 100 UNIT/ML KwikPen [id number]  Inject 5 Units into the skin 3 (three) times daily before meals. Do not inject insulin  if you are not going to eat meal.  Only take before a meal. [name], Lavelle Posey, NP  Active Self           Med Note (LLOYD, CRYSTAL L   Mon Aug 24, 2023  9:59 AM) Verified 5 units with her   linaclotide (LINZESS) 145 MCG CAPS capsule [id number]  Take 145 mcg by mouth daily before breakfast. [provider]  Active   losartan  (COZAAR ) 25 MG tablet [id number]  Take 0.5 tablets (12.5 mg total) by mouth daily. Kadali, Renuka A, MD  Active   metFORMIN  (GLUCOPHAGE ) 1000 MG tablet [id number]  Take 1 tablet (1,000 mg total) by mouth 2 (two) times daily with a meal. Kadali, Renuka A, MD  Active   metoprolol  succinate (TOPROL -XL) 25 MG 24 hr tablet [id number]  Take 1 tablet (25 mg total) by mouth daily. Take with or immediately following a meal. Shawnee Dellen A, FNP  Active   metoprolol  tartrate (LOPRESSOR ) 25 MG tablet [id number]  Take 12.5 mg by mouth 2 (two) times daily. [provider]  Active   montelukast  (SINGULAIR ) 10 MG tablet [id number]  Take 1 tablet (10 mg total) by mouth at bedtime. Kadali, Renuka A, MD  Active   naloxone  (NARCAN ) nasal spray 4 mg/0.1 mL [id number]  Place 1 spray into the nose as needed for up to 365 doses (for opioid-induced respiratory depresssion). In case of emergency (overdose), spray once into each nostril. If no response within 3 minutes, repeat application and call 911. Naveira, Francisco, MD  Active   Nebulizers (EASY AIR COMPRESSOR NEBULIZER) MISC [id number]  Use to inhaler nebulizer treatments at needed per instructions on nebulizer prescription [name], Jolene T, NP  Active Self  ondansetron  (ZOFRAN ) 4 MG tablet [id number]  Take 1 tablet (4 mg total) by mouth daily as needed for nausea or vomiting. Buell Carmin, MD  Active Self  OXYGEN  [id number]  Inhale 2 L into the lungs at bedtime. [provider]  Active   PAIN MANAGEMENT INTRATHECAL, IT, PUMP [id number]  1 each by Intrathecal route. Intrathecal (IT) medication:  Morphine  5.0 mg/ml, Bupivacaine 20.0 mg/ml,  Clonidine  100.0 mcg/ml Patient does not remember current. Adjusted every  3 months at [facility] [provider]  Active Self           Med Note Lyle San Apr 16, 2023  6:23 AM) Morphine , Bupivacaine and clonidine    pantoprazole  (PROTONIX ) 40 MG tablet [id number]  Take 1 tablet (40 mg total) by mouth daily. Kadali, Renuka A, MD  Active   polyethylene glycol (MIRALAX  / GLYCOLAX ) 17 g packet [id number]  Take 17 g by mouth daily as needed for moderate constipation. Kadali, Renuka A, MD  Active   predniSONE  (STERAPRED UNI-PAK 21 TAB) 10 MG (21) TBPK tablet [id number]  Take as instructed on packaging. Doran Galloway T, NP  Active   TRELEGY ELLIPTA  100-62.5-25 MCG/ACT AEPB [id number] Yes INHALE 1 PUFF INTO THE LUNGS DAILY . 42 DAY EXPIRATION AFTER OPENING FOIL TRAY [place], Jolene T, NP Taking Active   TRESIBA  FLEXTOUCH 100 UNIT/ML FlexTouch Pen [id number] Yes INJECT 55 UNITS SUBCUTANEOUSLY AT BEDTIME [name], Jolene T, NP Taking Active   TRULICITY  4.5 MG/0.5ML SOPN [id number] Yes INJECT 4.5MG  INTO THE SKIN ONCE WEEKLY AS DIRECTED  Patient taking differently: Wednesdays   [name], Jolene T, NP Taking Active   venlafaxine  XR (EFFEXOR -XR) 150 MG 24 hr capsule [id number]  Take 1 capsule (150 mg total) by mouth daily. Kadali, Renuka A, MD  Active   Med List Note Humberto Magnus, RN 12/15/23 1412): 12-15-2023  Clearance to stop Eliquis  for 3 days prior to pump replacement sent to   [facility]  [facility]            Recommendation:   {RECOMMENDATONS:32554}  Follow Up Plan:   {FOLLOWUP:32559}  SIG ***

## 2023-12-15 NOTE — Progress Notes (Signed)
 Safety precautions to be maintained throughout the outpatient stay will include: orient to surroundings, keep bed in low position, maintain call bell within reach at all times, provide assistance with transfer out of bed and ambulation.

## 2023-12-15 NOTE — Patient Instructions (Addendum)
 Patient has Narcan  at home   Opioid Overdose Opioids are drugs that are often used to treat pain. Opioids include illegal drugs, such as heroin, as well as prescription pain medicines, such as codeine, morphine , hydrocodone , and fentanyl . An opioid overdose happens when you take too much of an opioid. An overdose may be intentional or accidental and can happen with any type of opioid. The effects of an overdose can be mild, dangerous, or even deadly. Opioid overdose is a medical emergency. What are the causes? This condition may be caused by: Taking too much of an opioid on purpose. Taking too much of an opioid by accident. Using two or more substances that contain opioids at the same time. Taking an opioid with a substance that affects your heart, breathing, or blood pressure. These include alcohol , tranquilizers, sleeping pills, illegal drugs, and some over-the-counter medicines. This condition may also happen due to an error made by: A health care provider who prescribes a medicine. The pharmacist who fills the prescription. What increases the risk? This condition is more likely in: Children. They may be attracted to colorful pills. Because of a child's small size, even a small amount of a medicine can be dangerous. Older people. They may be taking many different medicines. Older people may have difficulty reading labels or remembering when they last took their medicines. They may also be more sensitive to the effects of opioids. People with chronic medical conditions, especially heart, liver, kidney, or neurological diseases. People who take an opioid for a long period of time. People who take opioids and use illegal drugs, such as heroin, or other substances, such as alcohol . People who: Have a history of drug or alcohol  abuse. Have certain mental health conditions. Have a history of previous drug overdoses. People who take opioids that are not prescribed for them. What are the  signs or symptoms? Symptoms of this condition depend on the type of opioid and the amount that was taken. Common symptoms include: Sleepiness or difficulty waking from sleep. Confusion. Slurred speech. Slowed breathing and a slow pulse (bradycardia). Nausea and vomiting. Abnormally small pupils. Signs and symptoms that require emergency treatment include: Cold, clammy, and pale skin. Blue lips and fingernails. Vomiting. Gurgling sounds in the throat. A pulse that is very slow or difficult to detect. Breathing that is very irregular, slow, noisy, or difficult to detect. Inability to respond to speech or be awakened from sleep (stupor). Seizures. How is this diagnosed? This condition is diagnosed based on your symptoms and medical history. It is important to tell your health care provider: About all of the opioids that you took. When you took the opioids. Whether you were drinking alcohol  or using marijuana, cocaine, or other drugs. Your health care provider will do a physical exam. This exam may include: Checking and monitoring your heart rate and rhythm, breathing rate, temperature, and blood pressure. Measuring oxygen  levels in your blood. Checking for abnormally small pupils. You may also have blood tests or urine tests. You may have X-rays if you are having severe breathing problems. How is this treated? This condition requires immediate medical treatment and hospitalization. Reversing the effects of the opioid is the first step in treatment. If you have a Narcan  kit or naloxone , use it right away. Follow your health care provider's instructions. A friend or family member can also help you with this. The rest of your treatment will be given in the hospital intensive care (ICU). Treatment in the hospital may include: Giving salts  and minerals (electrolytes) along with fluids through an IV. Inserting a breathing tube (endotracheal tube) in your airway to help you breathe if you cannot  breathe on your own or you are in danger of not being able to breathe on your own. Giving oxygen  through a small tube under your nose. Passing a tube through your nose and into your stomach (nasogastric tube, or NG tube) to empty your stomach. Giving medicines that: Increase your blood pressure. Relieve nausea and vomiting. Relieve abdominal pain and cramping. Reverse the effects of the opioid (naloxone ). Monitoring your heart and oxygen  levels. Ongoing counseling and mental health support if you intentionally overdosed or used an illegal drug. Follow these instructions at home:  Medicines Take over-the-counter and prescription medicines only as told by your health care provider. Always ask your health care provider about possible side effects and interactions of any new medicine that you start taking. Keep a list of all the medicines that you take, including over-the-counter medicines. Bring this list with you to all your medical visits. General instructions Drink enough fluid to keep your urine pale yellow. Keep all follow-up visits. This is important. How is this prevented? Read the drug inserts that come with your opioid pain medicines. Take medicines only as told by your health care provider. Do not take more medicine than you are told. Do not take medicines more frequently than you are told. Do not drink alcohol  or take sedatives when taking opioids. Do not use illegal or recreational drugs, including cocaine, ecstasy, and marijuana. Do not take opioid medicines that are not prescribed for you. Store all medicines in safety containers that are out of the reach of children. Get help if you are struggling with: Alcohol  or drug use. Depression or another mental health problem. Thoughts of hurting yourself or another person. Keep the phone number of your local poison control center near your phone or in your mobile phone. In the [place] the hotline of the [facility]  is 6024797454. If you were prescribed naloxone , make sure you understand how to take it. Contact a health care provider if: You need help understanding how to take your pain medicines. You feel your medicines are too strong. You are concerned that your pain medicines are not working well for your pain. You develop new symptoms or side effects when you are taking medicines. Get help right away if: You or someone else is having symptoms of an opioid overdose. Get help even if you are not sure. You have thoughts about hurting yourself or others. You have: Chest pain. Difficulty breathing. A loss of consciousness. These symptoms may represent a serious problem that is an emergency. Do not wait to see if the symptoms will go away. Get medical help right away. Call your local emergency services (911 in the [place] Do not drive yourself to the hospital. If you ever feel like you may hurt yourself or others, or have thoughts about taking your own life, get help right away. You can go to your nearest emergency department or: Call your local emergency services (911 in the [place] Call a suicide crisis helpline, such as the National Suicide Prevention Lifeline at 680-300-3533 or 988 in the [place] This is open 24 hours a day in the [place] If you're a Veteran: Call 988 and press 1. This is open 24 hours a day. Text the [employer] at 229-328-2448. Summary Opioids are drugs that are often used to treat pain. Opioids include illegal drugs,  such as heroin, as well as prescription pain medicines. An opioid overdose happens when you take too much of an opioid. Overdoses can be intentional or accidental. Opioid overdose is very dangerous. It is a life-threatening emergency. If you or someone you know is experiencing an opioid overdose, get help right away. This information is not intended to replace advice given to you by your health care provider. Make sure you discuss any questions you have with your  health care provider. Document Revised: 04/27/2023 Document Reviewed: 10/31/2020 Elsevier Patient Education  2024 [employer]. ______________________________________________________________________    Opioid Pain Medication Update  To: All patients taking opioid pain medications. (I.e.: hydrocodone , hydromorphone , oxycodone, oxymorphone, morphine , codeine, methadone, tapentadol, tramadol, buprenorphine, fentanyl , etc.)  Re: Updated review of side effects and adverse reactions of opioid analgesics, as well as new information about long term effects of this class of medications.  Direct risks of long-term opioid therapy are not limited to opioid addiction and overdose. Potential medical risks include serious fractures, breathing problems during sleep, hyperalgesia, immunosuppression, chronic constipation, bowel obstruction, myocardial infarction, and tooth decay secondary to xerostomia.  Unpredictable adverse effects that can occur even if you take your medication correctly: Cognitive impairment, respiratory depression, and death. Most people think that if they take their medication "correctly", and "as instructed", that they will be safe. Nothing could be farther from the truth. In reality, a significant amount of recorded deaths associated with the use of opioids has occurred in individuals that had taken the medication for a long time, and were taking their medication correctly. The following are examples of how this can happen: Patient taking his/her medication for a long time, as instructed, without any side effects, is given a certain antibiotic or another unrelated medication, which in turn triggers a "Drug-to-drug interaction" leading to disorientation, cognitive impairment, impaired reflexes, respiratory depression or an untoward event leading to serious bodily harm or injury, including death.  Patient taking his/her medication for a long time, as instructed, without any side effects,  develops an acute impairment of liver and/or kidney function. This will lead to a rapid inability of the body to breakdown and eliminate their pain medication, which will result in effects similar to an "overdose", but with the same medicine and dose that they had always taken. This again may lead to disorientation, cognitive impairment, impaired reflexes, respiratory depression or an untoward event leading to serious bodily harm or injury, including death.  A similar problem will occur with patients as they grow older and their liver and kidney function begins to decrease as part of the aging process.  Background information: Historically, the original case for using long-term opioid therapy to treat chronic noncancer pain was based on safety assumptions that subsequent experience has called into question. In 1996, the American Pain Society and the American Academy of Pain Medicine issued a consensus statement supporting long-term opioid therapy. This statement acknowledged the dangers of opioid prescribing but concluded that the risk for addiction was low; respiratory depression induced by opioids was short-lived, occurred mainly in opioid-naive patients, and was antagonized by pain; tolerance was not a common problem; and efforts to control diversion should not constrain opioid prescribing. This has now proven to be wrong. Experience regarding the risks for opioid addiction, misuse, and overdose in community practice has failed to support these assumptions.  According to the Centers for Disease Control and Prevention, fatal overdoses involving opioid analgesics have increased sharply over the past decade. Currently, more than 96,700 people die from drug  overdoses every year. Opioids are a factor in 7 out of every 10 overdose deaths. Deaths from drug overdose have surpassed motor vehicle accidents as the leading cause of death for individuals between the ages of 69 and 17.  Clinical data suggest that  neuroendocrine dysfunction may be very common in both men and women, potentially causing hypogonadism, erectile dysfunction, infertility, decreased libido, osteoporosis, and depression. Recent studies linked higher opioid dose to increased opioid-related mortality. Controlled observational studies reported that long-term opioid therapy may be associated with increased risk for cardiovascular events. Subsequent meta-analysis concluded that the safety of long-term opioid therapy in elderly patients has not been proven.   Side Effects and adverse reactions: Common side effects: Drowsiness (sedation). Dizziness. Nausea and vomiting. Constipation. Physical dependence -- Dependence often manifests with withdrawal symptoms when opioids are discontinued or decreased. Tolerance -- As you take repeated doses of opioids, you require increased medication to experience the same effect of pain relief. Respiratory depression -- This can occur in healthy people, especially with higher doses. However, people with COPD, asthma or other lung conditions may be even more susceptible to fatal respiratory impairment.  Uncommon side effects: An increased sensitivity to feeling pain and extreme response to pain (hyperalgesia). Chronic use of opioids can lead to this. Delayed gastric emptying (the process by which the contents of your stomach are moved into your small intestine). Muscle rigidity. Immune system and hormonal dysfunction. Quick, involuntary muscle jerks (myoclonus). Arrhythmia. Itchy skin (pruritus). Dry mouth (xerostomia).  Long-term side effects: Chronic constipation. Sleep-disordered breathing (SDB). Increased risk of bone fractures. Hypothalamic-pituitary-adrenal dysregulation. Increased risk of overdose.  RISKS: Respiratory depression and death: Opioids increase the risk of respiratory depression and death.  Drug-to-drug interactions: Opioids are relatively contraindicated in combination  with benzodiazepines, sleep inducers, and other central nervous system depressants. Other classes of medications (i.e.: certain antibiotics and even over-the-counter medications) may also trigger or induce respiratory depression in some patients.  Medical conditions: Patients with pre-existing respiratory problems are at higher risk of respiratory failure and/or depression when in combination with opioid analgesics. Opioids are relatively contraindicated in some medical conditions such as central sleep apnea.   Fractures and Falls:  Opioids increase the risk and incidence of falls. This is of particular importance in elderly patients.  Endocrine System:  Long-term administration is associated with endocrine abnormalities (endocrinopathies). (Also known as Opioid-induced Endocrinopathy) Influences on both the hypothalamic-pituitary-adrenal axis?and the hypothalamic-pituitary-gonadal axis have been demonstrated with consequent hypogonadism and adrenal insufficiency in both sexes. Hypogonadism and decreased levels of dehydroepiandrosterone sulfate have been reported in men and women. Endocrine effects include: Amenorrhoea in women (abnormal absence of menstruation) Reduced libido in both sexes Decreased sexual function Erectile dysfunction in men Hypogonadisms (decreased testicular function with shrinkage of testicles) Infertility Depression and fatigue Loss of muscle mass Anxiety Depression Immune suppression Hyperalgesia Weight gain Anemia Osteoporosis Patients (particularly women of childbearing age) should avoid opioids. There is insufficient evidence to recommend routine monitoring of asymptomatic patients taking opioids in the long-term for hormonal deficiencies.  Immune System: Human studies have demonstrated that opioids have an immunomodulating effect. These effects are mediated via opioid receptors both on immune effector cells and in the central nervous system. Opioids have  been demonstrated to have adverse effects on antimicrobial response and anti-tumour surveillance. Buprenorphine has been demonstrated to have no impact on immune function.  Opioid Induced Hyperalgesia: Human studies have demonstrated that prolonged use of opioids can lead to a state of abnormal pain sensitivity, sometimes called opioid induced  hyperalgesia (OIH). Opioid induced hyperalgesia is not usually seen in the absence of tolerance to opioid analgesia. Clinically, hyperalgesia may be diagnosed if the patient on long-term opioid therapy presents with increased pain. This might be qualitatively and anatomically distinct from pain related to disease progression or to breakthrough pain resulting from development of opioid tolerance. Pain associated with hyperalgesia tends to be more diffuse than the pre-existing pain and less defined in quality. Management of opioid induced hyperalgesia requires opioid dose reduction.  Cancer: Chronic opioid therapy has been associated with an increased risk of cancer among noncancer patients with chronic pain. This association was more evident in chronic strong opioid users. Chronic opioid consumption causes significant pathological changes in the small intestine and colon. Epidemiological studies have found that there is a link between opium  dependence and initiation of gastrointestinal cancers. Cancer is the second leading cause of death after cardiovascular disease. Chronic use of opioids can cause multiple conditions such as GERD, immunosuppression and renal damage as well as carcinogenic effects, which are associated with the incidence of cancers.   Mortality: Long-term opioid use has been associated with increased mortality among patients with chronic non-cancer pain (CNCP).  Prescription of long-acting opioids for chronic noncancer pain was associated with a significantly increased risk of all-cause mortality, including deaths from causes other than  overdose.  Reference: Von Korff M, Kolodny A, Deyo RA, Chou R. Long-term opioid therapy reconsidered. Ann Intern Med. 2011 Sep 6;155(5):325-8. doi: 10.7326/[id number]. PMID: 40981191; PMCID: [id number]. Achilles Achilles, Hayward RA, Dunn KM, [place] KP. Risk of adverse events in patients prescribed long-term opioids: A cohort study in the [place] [facility] [employer]. Eur J Pain. 2019 May;23(5):908-922. doi: 10.1002/ejp.1357. Epub 2019 Jan 31. PMID: 08657846. Colameco S, Coren JS, Ciervo CA. Continuous opioid treatment for chronic noncancer pain: a time for moderation in prescribing. Postgrad Med. 2009 Jul;121(4):61-6. doi: 10.3810/pgm.2009.07.2032. PMID: 96295284. Orlan Bis RN, [place], Blazina I, Sheliah Deutscher, Bougatsos C, Deyo RA. The effectiveness and risks of long-term opioid therapy for chronic pain: a systematic review for a [employer] to [employer]. Ann Intern Med. 2015 Feb 17;162(4):276-86. doi: 10.7326/M14-2559. PMID: 13244010. [facility], [place] DM. NCHS Data Brief No. 22. [place]: Centers for Disease Control and Prevention; 2009. Sep, Increase in Fatal Poisonings Involving Opioid Analgesics in the [place] , 1999-2006. Song IA, Choi HR, Oh TK. Long-term opioid use and mortality in patients with chronic non-cancer pain: Ten-year follow-up study in [place] from 2010 through 2019. EClinicalMedicine. 2022 Jul 18;51:101558. doi: 10.1016/j.eclinm.2022.272536. PMID: 64403474; PMCID: [id number]. Huser, W., Schubert, T., Vogelmann, T. et al. All-cause mortality in patients with long-term opioid therapy compared with non-opioid analgesics for chronic non-cancer pain: a database study. BMC Med 18, 162 (2020). http://lester.info/ Rashidian H, Zendehdel K, Kamangar F, Malekzadeh R, Haghdoost AA. An Ecological Study of the Association between Opiate Use and Incidence  of Cancers. Addict Health. 2016 Fall;8(4):252-260. PMID: 43329518; PMCID: [id number].  Our Goal: Our goal is to control your pain with means other than the use of opioid pain medications.  Our Recommendation: Talk to your physician about coming off of these medications. We can assist you with the tapering down and stopping these medicines. Based on the new information, even if you cannot completely stop the medication, a decrease in the dose may be associated with a lesser risk. Ask for other means of controlling the pain. Decrease or eliminate those factors that significantly contribute to your  pain such as smoking, obesity, and a diet heavily tilted towards "inflammatory" nutrients.  Last Updated: 02/09/2023   ______________________________________________________________________       ______________________________________________________________________    National Pain Medication Shortage  The [place] experiencing worsening drug shortages. These have had a negative widespread effect on patient care and treatment. Not expected to improve any time soon. Predicted to last past 2029.   Drug shortage list (generic names) Oxycodone IR Oxycodone/APAP Oxymorphone IR Hydromorphone  Hydrocodone /APAP Morphine   Where is the problem?  Manufacturing and supply level.  Will this shortage affect you?  Only if you take any of the above pain medications.  How? You may be unable to fill your prescription.  Your pharmacist may offer a "partial fill" of your prescription. (Warning: Do not accept partial fills.) Prescriptions partially filled cannot be transferred to another pharmacy. Read our Medication Rules and Regulation. Depending on how much medicine you are dependent on, you may experience withdrawals when unable to get the medication.  Recommendations: Consider ending your dependence on opioid pain medications. Ask your pain specialist to assist you with the process. Consider  switching to a medication currently not in shortage, such as Buprenorphine. Talk to your pain specialist about this option. Consider decreasing your pain medication requirements by managing tolerance thru "Drug Holidays". This may help minimize withdrawals, should you run out of medicine. Control your pain thru the use of non-pharmacological interventional therapies.   Your prescriber: Prescribers cannot be blamed for shortages. Medication manufacturing and supply issues cannot be fixed by the prescriber.   NOTE: The prescriber is not responsible for supplying the medication, or solving supply issues. Work with your pharmacist to solve it. The patient is responsible for the decision to take or continue taking the medication and for identifying and securing a legal supply source. By law, supplying the medication is the job and responsibility of the pharmacy. The prescriber is responsible for the evaluation, monitoring, and prescribing of these medications.   Prescribers will NOT: Re-issue prescriptions that have been partially filled. Re-issue prescriptions already sent to a pharmacy.  Re-send prescriptions to a different pharmacy because yours did not have your medication. Ask pharmacist to order more medicine or transfer the prescription to another pharmacy. (Read below.)  New 2023 regulation: "April 04, 2022 Revised Regulation Allows DEA-Registered Pharmacies to Transfer Electronic Prescriptions at a Patient's Request DEA Headquarters Division - Public Information Office Patients now have the ability to request their electronic prescription be transferred to another pharmacy without having to go back to their practitioner to initiate the request. This revised regulation went into effect on Monday, March 31, 2022.     At a patient's request, a DEA-registered retail pharmacy can now transfer an electronic prescription for a controlled substance (schedules II-V) to another DEA-registered  retail pharmacy. Prior to this change, patients would have to go through their practitioner to cancel their prescription and have it re-issued to a different pharmacy. The process was taxing and time consuming for both patients and practitioners.    The Drug Enforcement Administration [facility]) published its intent to revise the process for transferring electronic prescriptions on June 22, 2020.  The final rule was published in the federal register on February 27, 2022 and went into effect 30 days later.  Under the final rule, a prescription can only be transferred once between pharmacies, and only if allowed under existing state or other applicable law. The prescription must remain in its electronic form; may not be altered in any way; and  the transfer must be communicated directly between two licensed pharmacists. It's important to note, any authorized refills transfer with the original prescription, which means the entire prescription will be filled at the same pharmacy".  Reference: [email or web address] [facility] website announcement)  [email or web address] [employer])   [employer] / Vol. 88, No. 143 / Thursday, February 27, 2022 / Rules and Regulations DEPARTMENT OF JUSTICE  Drug Enforcement Administration  21 CFR Part 1306  [Docket No. [id number]]  RIN [id number] Transfer of Electronic Prescriptions for Schedules II-V Controlled Substances Between Pharmacies for Initial Filling  ______________________________________________________________________       ______________________________________________________________________    Transfer of Pain Medication between Pharmacies  Re: 2023 DEA Clarification on existing regulation  Published on DEA Website: April 04, 2022  Title: Revised Regulation Allows DEA-Registered  Pharmacies to Electrical engineer Prescriptions at a Patient's Request DEA Headquarters Division - [employer]  "Patients now have the ability to request their electronic prescription be transferred to another pharmacy without having to go back to their practitioner to initiate the request. This revised regulation went into effect on Monday, March 31, 2022.     At a patient's request, a DEA-registered retail pharmacy can now transfer an electronic prescription for a controlled substance (schedules II-V) to another DEA-registered retail pharmacy. Prior to this change, patients would have to go through their practitioner to cancel their prescription and have it re-issued to a different pharmacy. The process was taxing and time consuming for both patients and practitioners.    The Drug Enforcement Administration [facility]) published its intent to revise the process for transferring electronic prescriptions on June 22, 2020.  The final rule was published in the federal register on February 27, 2022 and went into effect 30 days later.  Under the final rule, a prescription can only be transferred once between pharmacies, and only if allowed under existing state or other applicable law. The prescription must remain in its electronic form; may not be altered in any way; and the transfer must be communicated directly between two licensed pharmacists. It's important to note, any authorized refills transfer with the original prescription, which means the entire prescription will be filled at the same pharmacy."    REFERENCES: 1. DEA website announcement [email or web address]  2. Department of Justice website  [email or web address]  3. DEPARTMENT OF JUSTICE Drug Enforcement Administration 21 CFR Part 1306 [Docket No. [id number]] RIN [id number] "Transfer of  Electronic Prescriptions for Schedules II-V Controlled Substances Between Pharmacies for Initial Filling"  ______________________________________________________________________       ______________________________________________________________________    Medication Rules  Purpose: To inform patients, and their family members, of our medication rules and regulations.  Applies to: All patients receiving prescriptions from our practice (written or electronic).  Pharmacy of record: This is the pharmacy where your electronic prescriptions will be sent. Make sure we have the correct one.  Electronic prescriptions: In compliance with the [employer]  Strengthen Opioid Misuse Prevention (STOP) Act of 2017 (Session Law 2017-74/H243), effective August 04, 2018, all controlled substances must be electronically prescribed. Written prescriptions, faxing, or calling prescriptions to a pharmacy will no longer be done.  Prescription refills: These will be provided only during in-person appointments. No medications will be renewed without a "face-to-face" evaluation with your provider. Applies to all prescriptions.  NOTE: The following applies primarily to controlled substances (Opioid* Pain Medications).   Type of encounter (visit): For patients receiving controlled substances, face-to-face visits are required. (Not an option and not up to the patient.)  Patient's Responsibilities: Pain Pills: Bring all pain pills to every appointment (except for procedure appointments). Pill counts are required.  Pill Bottles: Bring pills in original pharmacy bottle. Bring bottle, even if empty. Always bring the bottle of the most recent fill.  Medication refills: You are responsible for knowing and keeping track of what medications you are taking and when is it that you will need a refill. The day before your appointment: write a list of all prescriptions that need to be refilled. The day of the appointment: give  the list to the admitting nurse. Prescriptions will be written only during appointments. No prescriptions will be written on procedure days. If you forget a medication: it will not be "Called in", "Faxed", or "electronically sent". You will need to get another appointment to get these prescribed. No early refills. Do not call asking to have your prescription filled early. Partial  or short prescriptions: Occasionally your pharmacy may not have enough pills to fill your prescription.  NEVER ACCEPT a partial fill or a prescription that is short of the total amount of pills that you were prescribed.  With controlled substances the law allows 72 hours for the pharmacy to complete the prescription.  If the prescription is not completed within 72 hours, the pharmacist will require a new prescription to be written. This means that you will be short on your medicine and we WILL NOT send another prescription to complete your original prescription.  Instead, request the pharmacy to send a carrier to a nearby branch to get enough medication to provide you with your full prescription. Prescription Accuracy: You are responsible for carefully inspecting your prescriptions before leaving our office. Have the discharge nurse carefully go over each prescription with you, before taking them home. Make sure that your name is accurately spelled, that your address is correct. Check the name and dose of your medication to make sure it is accurate. Check the number of pills, and the written instructions to make sure they are clear and accurate. Make sure that you are given enough medication to last until your next medication refill appointment. Taking Medication: Take medication as prescribed. When it comes to controlled substances, taking less pills or less frequently than prescribed is permitted and encouraged. Never take more pills than instructed. Never take the medication more frequently than prescribed.  Inform other  Doctors: Always inform, all of your healthcare providers, of all the medications you take. Pain Medication from other Providers: You are not allowed to accept any additional pain medication from any other Doctor or Healthcare provider. There are two exceptions to this rule. (see below) In the event that you require additional pain medication, you are responsible for notifying us , as stated below. Cough Medicine: Often these contain an opioid, such as codeine or hydrocodone . Never accept or take cough medicine containing these opioids if you are already taking an opioid* medication. The combination may cause respiratory failure and death. Medication Agreement: You are responsible for carefully reading and following our Medication Agreement. This must be signed before receiving any prescriptions from our practice. Safely store a copy of your signed Agreement. Violations to the Agreement will result in no further prescriptions. (Additional copies of our Medication Agreement are available upon request.) Laws, Rules, & Regulations: All patients are expected to follow all [address] and [employer], Rules, [place] [employer]. Ignorance of the Laws does not constitute a valid excuse.  Illegal drugs and Controlled Substances: The use of illegal substances (including, but not limited  to marijuana and its derivatives) and/or the illegal use of any controlled substances is strictly prohibited. Violation of this rule may result in the immediate and permanent discontinuation of any and all prescriptions being written by our practice. The use of any illegal substances is prohibited. Adopted CDC guidelines & recommendations: Target dosing levels will be at or below 60 MME/day. Use of benzodiazepines** is not recommended. Urine Drug testing: Patients taking controlled substances will be required to provide a urine sample upon request. Do not void before coming to your medication management appointments. Hold emptying your  bladder until you are admitted. The admitting nurse will inform you if a sample is required. Our practice reserves the right to call you at any time to provide a sample. Once receiving the call, you have 24 hours to comply with request. Not providing a sample upon request may result in termination of medication therapy.  Exceptions: There are only two exceptions to the rule of not receiving pain medications from other Healthcare Providers. Exception #1 (Emergencies): In the event of an emergency (i.e.: accident requiring emergency care), you are allowed to receive additional pain medication. However, you are responsible for: As soon as you are able, call our office [phone], at any time of the day or night, and leave a message stating your name, the date and nature of the emergency, and the name and dose of the medication prescribed. In the event that your call is answered by a member of our staff, make sure to document and save the date, time, and the name of the person that took your information.  Exception #2 (Planned Surgery): In the event that you are scheduled by another doctor or dentist to have any type of surgery or procedure, you are allowed (for a period no longer than 30 days), to receive additional pain medication, for the acute post-op pain. However, in this case, you are responsible for picking up a copy of our "Post-op Pain Management for Surgeons" handout, and giving it to your surgeon or dentist. This document is available at our office, and does not require an appointment to obtain it. Simply go to our office during business hours (Monday-Thursday from 8:00 AM to 4:00 PM) (Friday 8:00 AM to 12:00 Noon) or if you have a scheduled appointment with us , prior to your surgery, and ask for it by name. In addition, you are responsible for: calling our office [phone], at any time of the day or night, and leaving a message stating your name, name of your surgeon, type of surgery, and date  of procedure or surgery. Failure to comply with your responsibilities may result in termination of therapy involving the controlled substances.  Consequences:  Non-compliance with the above rules may result in permanent discontinuation of medication prescription therapy. All patients receiving any type of controlled substance is expected to comply with the above patient responsibilities. Not doing so may result in permanent discontinuation of medication prescription therapy. Medication Agreement Violation. Following the above rules, including your responsibilities will help you in avoiding a Medication Agreement Violation ("Breaking your Pain Medication Contract").  *Opioid medications include: morphine , codeine, oxycodone, oxymorphone, hydrocodone , hydromorphone , meperidine, tramadol, tapentadol, buprenorphine, fentanyl , methadone. **Benzodiazepine medications include: diazepam  (Valium ), alprazolam  (Xanax ), clonazepam  (Klonopine), lorazepam  (Ativan ), clorazepate (Tranxene), chlordiazepoxide (Librium), estazolam (Prosom), oxazepam (Serax), temazepam (Restoril), triazolam (Halcion) (Last updated: 05/27/2023) ______________________________________________________________________      ______________________________________________________________________    Medication Recommendations and Reminders  Applies to: All patients receiving prescriptions (written and/or electronic).  Medication Rules &  Regulations: You are responsible for reading, knowing, and following our "Medication Rules" document. These exist for your safety and that of others. They are not flexible and neither are we. Dismissing or ignoring them is an act of "non-compliance" that may result in complete and irreversible termination of such medication therapy. For safety reasons, "non-compliance" will not be tolerated. As with the [place] fundamental legal principle of "ignorance of the law is no defense", we will accept no excuses for not  having read and knowing the content of documents provided to you by our practice.  Pharmacy of record:  Definition: This is the pharmacy where your electronic prescriptions will be sent.  We do not endorse any particular pharmacy. It is up to you and your insurance to decide what pharmacy to use.  We do not restrict you in your choice of pharmacy. However, once we write for your prescriptions, we will NOT be re-sending more prescriptions to fix restricted supply problems created by your pharmacy, or your insurance.  The pharmacy listed in the electronic medical record should be the one where you want electronic prescriptions to be sent. If you choose to change pharmacy, simply notify our nursing staff. Changes will be made only during your regular appointments and not over the phone.  Recommendations: Keep all of your pain medications in a safe place, under lock and key, even if you live alone. We will NOT replace lost, stolen, or damaged medication. We do not accept "Police Reports" as proof of medications having been stolen. After you fill your prescription, take 1 week's worth of pills and put them away in a safe place. You should keep a separate, properly labeled bottle for this purpose. The remainder should be kept in the original bottle. Use this as your primary supply, until it runs out. Once it's gone, then you know that you have 1 week's worth of medicine, and it is time to come in for a prescription refill. If you do this correctly, it is unlikely that you will ever run out of medicine. To make sure that the above recommendation works, it is very important that you make sure your medication refill appointments are scheduled at least 1 week before you run out of medicine. To do this in an effective manner, make sure that you do not leave the office without scheduling your next medication management appointment. Always ask the nursing staff to show you in your prescription , when your medication  will be running out. Then arrange for the receptionist to get you a return appointment, at least 7 days before you run out of medicine. Do not wait until you have 1 or 2 pills left, to come in. This is very poor planning and does not take into consideration that we may need to cancel appointments due to bad weather, sickness, or emergencies affecting our staff. DO NOT ACCEPT A "Partial Fill": If for any reason your pharmacy does not have enough pills/tablets to completely fill or refill your prescription, do not allow for a "partial fill". The law allows the pharmacy to complete that prescription within 72 hours, without requiring a new prescription. If they do not fill the rest of your prescription within those 72 hours, you will need a separate prescription to fill the remaining amount, which we will NOT provide. If the reason for the partial fill is your insurance, you will need to talk to the pharmacist about payment alternatives for the remaining tablets, but again, DO NOT ACCEPT A PARTIAL FILL,  unless you can trust your pharmacist to obtain the remainder of the pills within 72 hours.  Prescription refills and/or changes in medication(s):  Prescription refills, and/or changes in dose or medication, will be conducted only during scheduled medication management appointments. (Applies to both, written and electronic prescriptions.) No refills on procedure days. No medication will be changed or started on procedure days. No changes, adjustments, and/or refills will be conducted on a procedure day. Doing so will interfere with the diagnostic portion of the procedure. No phone refills. No medications will be "called into the pharmacy". No Fax refills. No weekend refills. No Holliday refills. No after hours refills.  Remember:  Business hours are:  Monday to Thursday 8:00 AM to 4:00 PM Provider's Schedule: Renaldo Caroli, MD - Appointments are:  Medication management: Monday and Wednesday 8:00 AM  to 4:00 PM Procedure day: Tuesday and Thursday 7:30 AM to 4:00 PM Cephus Collin, MD - Appointments are:  Medication management: Tuesday and Thursday 8:00 AM to 4:00 PM Procedure day: Monday and Wednesday 7:30 AM to 4:00 PM (Last update: 05/27/2022) ______________________________________________________________________

## 2023-12-15 NOTE — Progress Notes (Signed)
 PROVIDER NOTE: Interpretation of information contained herein should be left to medically-trained personnel. Specific patient instructions are provided elsewhere under "Patient Instructions" section of medical record. This document was created in part using STT-dictation technology, any transcriptional errors that may result from this process are unintentional.  Patient: Jordan Chan Type: Established DOB: [id number] MRN: [id number] PCP: Lemar Pyles, NP  Service: Procedure DOS: 12/15/2023 Setting: Ambulatory Location: Ambulatory outpatient facility Delivery: Face-to-face Provider: Candi Chafe, MD Specialty: Interventional Pain Management Specialty designation: 09 Location: Outpatient facility Ref. Prov.: [name], Jolene T, NP       Interventional Therapy   Primary Reason for Visit: Interventional Pain Management Treatment. CC: Back Pain and Neck Pain  Procedure:          Type: Management of Intrathecal Drug Delivery System (IDDS) - Reservoir Refill (29528). No rate change.  Indications: 1. Chronic pain syndrome   2. Chronic low back pain (1ry area of Pain) (Bilateral) (R>L) w/o sciatica   3. Chronic lower extremity pain (2ry area of Pain) (Bilateral) (R>L)   4. Chronic radicular pain of lower extremity   5. Chronic hip pain (Left)   6. DDD (degenerative disc disease), lumbosacral w/ LBP & LEP   7. Diabetic peripheral neuropathy (HCC)   8. Presence of intrathecal pump   9. Encounter for adjustment and management of infusion pump   10. Encounter for medication management   11. Pharmacologic therapy   12. Chronic use of opiate for therapeutic purpose   13. Chronic anticoagulation (Eliquis )    Pain Assessment: Self-Reported Pain Score: 5 /10             Reported level is compatible with observation.        PMP indicates that the patient received hydrocodone  from Rand Burrs., PA-C ([facility] urgent [facility] in [place]) on 11/20/2023.  This was  apparently given to the patient for neck pain.  She has failed to call and disclose having received additional opioid analgesics.  Issues discussed with the patient.  She is now well aware of the fact that she needs to avoid getting additional pain medication from anybody else.  The patient's pump is reaching end of battery life and therefore needs to be changed.  We will schedule the patient for the changes soon as we can get it approved and scheduled.    IDDS (Intrathecal Drug Delivery System):   Device:  Manufacturer: Medtronic Synchromed II Model: [id number] Serial No.: [id number] Type: Programmable  Volume: 40 mL reservoir Catheter Placement: Intrathecal Catheter Level: TBD MRI compatibility: Yes   Implant Details:  Implant Date: 05/27/2017 Implanter: Sandralee Crow, MD [phone] Delivery Route: Intrathecal Site: Abdominal Laterality:  TBD   Content:  1ry Medication Class: Opioid 1ry Medication (Concentration): PF-Morphine  (5.0 mg/mL) 2ry Medication (Concentration): PF-Bupivacaine (20.0 mg/mL) 3ry Medication (Concentration): PF-Clonidine  (100.0 mcg/mL)  PTM parameters:  Mode: Off/Inactive Bolus Dose: N/A Duration: N/A (min) Max. No.: N/A (bolus/day). Lock-out time (interval): N/A (hrs) Max. Daily bolus dose: N/A (mg/day) Total max. Daily dose (basal + bolus): N/A (mg/day)  Programming:  Type: Simple continuous.  Medication, Concentration, Infusion Program, & Delivery Rate: For up-to-date details please see most recent scanned programming printout.   Changes:  Medication Change: None at this point Rate Change: No change in rate  Reported side-effects or adverse reactions: None reported  Effectiveness: Described as relatively effective, allowing for increase in activities of daily living (ADL) Clinically meaningful improvement in function (CMIF): Sustained CMIF goals met  Plan: Pump refill  today   Pharmacotherapy Assessment  Opioid Analgesic:    morphine  sulfate powder (for intrathecal pump use)  MME/day: 2.5 mg/day (Intrathecal)    Monitoring: [place] PMP: PDMP reviewed during this encounter.       Pharmacotherapy: No side-effects or adverse reactions reported. Compliance: No problems identified. Effectiveness: Clinically acceptable. Plan: Refer to "POC". UDS:  Summary  Date Value Ref Range Status  06/17/2023 Note  Final    Comment:    ==================================================================== Compliance Drug Analysis, Ur ==================================================================== Test                             Result       Flag       Units  Drug Present and Declared for Prescription Verification   Gabapentin                      PRESENT      EXPECTED   Venlafaxine                     PRESENT      EXPECTED   Desmethylvenlafaxine           PRESENT      EXPECTED    Desmethylvenlafaxine is an expected metabolite of venlafaxine .    Metoprolol                      PRESENT      EXPECTED  Drug Present not Declared for Prescription Verification   Morphine                        1930         UNEXPECTED ng/mg creat    Potential sources of morphine  include administration of codeine or    morphine , use of heroin, or ingestion of poppy seeds.  Drug Absent but Declared for Prescription Verification   Hydrocodone                     Not Detected UNEXPECTED ng/mg creat   Cyclobenzaprine                 Not Detected UNEXPECTED   Acetaminophen                   Not Detected UNEXPECTED    Acetaminophen , as indicated in the declared medication list, is not    always detected even when used as directed.  ==================================================================== Test                      Result    Flag   Units      Ref Range   Creatinine              97               mg/dL      >=60 ==================================================================== Declared Medications:  The flagging and interpretation on this  report are based on the  following declared medications.  Unexpected results may arise from  inaccuracies in the declared medications.   **Note: The testing scope of this panel includes these medications:   Cyclobenzaprine  (Flexeril )  Gabapentin   Hydrocodone  (Norco)  Metoprolol  (Toprol )  Venlafaxine  (Effexor )   **Note: The testing scope of this panel does not include small to  moderate amounts of these reported medications:   Acetaminophen  (Norco)   **Note: The testing scope of this panel does not  include the  following reported medications:   Albuterol   Apixaban  (Eliquis )  Atorvastatin  (Lipitor)  Buspirone  (Buspar )  Dulaglutide  (Trulicity )  Fluticasone  (Trelegy)  Folic Acid   Furosemide  (Lasix )  Insulin  (Tresiba )  Losartan  (Cozaar )  Metformin   Montelukast   Ondansetron  (Zofran )  Pantoprazole  (Protonix )  Umeclidinium (Trelegy)  Vilanterol (Trelegy)  Vitamin B12  Vitamin D3 ==================================================================== For clinical consultation, please call [phone]. ====================================================================    No results found for: "CBDTHCR", "D8THCCBX", "D9THCCBX"   H&P (Pre-op Assessment):  Ms. Brunell is a 73 y.o. (year old), female patient, seen today for interventional treatment. She  has a past surgical history that includes Abdominal hysterectomy (1987); Elbow arthroscopy with tendon reconstruction; morphine  pump (2011); Plantar fascia release; Esophagogastroduodenoscopy (egd) with propofol  (N/A, 01/01/2017); Colonoscopy with propofol  (N/A, 01/01/2017); LEFT HEART CATH AND CORONARY ANGIOGRAPHY (N/A, 04/28/2017); Mastectomy (Right, 06/1997); Back surgery; Breast surgery (Right); Cardiac catheterization; Trigger finger release; Cataract extraction w/PHACO (Right, 05/19/2019); Intrathecal pump implant; Coccyx removal; Cataract extraction w/PHACO (Left, 06/16/2019); Colonoscopy with propofol  (N/A, 12/11/2021);  Esophagogastroduodenoscopy (egd) with propofol  (N/A, 12/11/2021); and Total hip arthroplasty (Left). Ms. Mackowski has a current medication list which includes the following prescription(s): accu-chek aviva plus, accu-chek softclix lancets, albuterol , albuterol , alum & mag hydroxide-simeth, apixaban , atorvastatin , true metrix meter, buspirone , cholecalciferol , freestyle libre 2 reader, freestyle libre 2 sensor, cyanocobalamin , cyclobenzaprine , dapagliflozin  propanediol, docusate sodium , feeding supplement, ferrous sulfate , folic acid , furosemide , gabapentin , hydrocodone -acetaminophen , insulin  lispro, linaclotide, losartan , metformin , metoprolol  succinate, metoprolol  tartrate, montelukast , naloxone , easy air compressor nebulizer, ondansetron , oxygen -helium, PAIN MANAGEMENT INTRATHECAL, IT, PUMP, pantoprazole , polyethylene glycol, prednisone , trelegy ellipta , tresiba  flextouch, trulicity , and venlafaxine  xr. Her primarily concern today is the Back Pain and Neck Pain  Initial Vital Signs:  Pulse/HCG Rate: (!) 102  Temp: (!) 96.6 F (35.9 C) Resp: 20 (O2@ 2L) BP: (!) 126/58 SpO2: 100 %  BMI: Estimated body mass index is 29.68 kg/m as calculated from the following:   Height as of this encounter: 4\' 10"  (1.473 m).   Weight as of this encounter: 142 lb (64.4 kg).  Risk Assessment: Allergies: Reviewed. She is allergic to other, pain patch [menthol], jardiance  [empagliflozin ], prednisone , avelox  [moxifloxacin  hcl in nacl], doxycycline , erythromycin , fentanyl , moxifloxacin  hcl, oxycontin [oxycodone], and ozempic [semaglutide].  Allergy Precautions: None required Coagulopathies: Reviewed. None identified.  Blood-thinner therapy: None at this time Active Infection(s): Reviewed. None identified. Ms. Tagawa is afebrile  Site Confirmation: Ms. Canet was asked to confirm the procedure and laterality before marking the site Procedure checklist: Completed Consent: Before the procedure and under the  influence of no sedative(s), amnesic(s), or anxiolytics, the patient was informed of the treatment options, risks and possible complications. To fulfill our ethical and legal obligations, as recommended by the American Medical Association's Code of Ethics, I have informed the patient of my clinical impression; the nature and purpose of the treatment or procedure; the risks, benefits, and possible complications of the intervention; the alternatives, including doing nothing; the risk(s) and benefit(s) of the alternative treatment(s) or procedure(s); and the risk(s) and benefit(s) of doing nothing.  Ms. Bertolet was provided with information about the general risks and possible complications associated with most interventional procedures. These include, but are not limited to: failure to achieve desired goals, infection, bleeding, organ or nerve damage, allergic reactions, paralysis, and/or death.  In addition, she was informed of those risks and possible complications associated to this particular procedure, which include, but are not limited to: damage to the implant; failure to decrease pain; local, systemic, or serious CNS infections, intraspinal abscess with possible  cord compression and paralysis, or life-threatening such as meningitis; bleeding; organ damage; nerve injury or damage with subsequent sensory, motor, and/or autonomic system dysfunction, resulting in transient or permanent pain, numbness, and/or weakness of one or several areas of the body; allergic reactions, either minor or major life-threatening, such as anaphylactic or anaphylactoid reactions.  Furthermore, Ms. Saley was informed of those risks and complications associated with the medications. These include, but are not limited to: allergic reactions (i.e.: anaphylactic or anaphylactoid reactions); endorphine suppression; bradycardia and/or hypotension; water  retention and/or peripheral vascular relaxation leading to lower extremity  edema and possible stasis ulcers; respiratory depression and/or shortness of breath; decreased metabolic rate leading to weight gain; swelling or edema; medication-induced neural toxicity; particulate matter embolism and blood vessel occlusion with resultant organ, and/or nervous system infarction; and/or intrathecal granuloma formation with possible spinal cord compression and permanent paralysis.  Before refilling the pump Ms. Lawe was informed that some of the medications used in the devise may not be FDA approved for such use and therefore it constitutes an off-label use of the medications.  Finally, she was informed that Medicine is not an exact science; therefore, there is also the possibility of unforeseen or unpredictable risks and/or possible complications that may result in a catastrophic outcome. The patient indicated having understood very clearly. We have given the patient no guarantees and we have made no promises. Enough time was given to the patient to ask questions, all of which were answered to the patient's satisfaction. Ms. Wadman has indicated that she wanted to continue with the procedure. Attestation: I, the ordering provider, attest that I have discussed with the patient the benefits, risks, side-effects, alternatives, likelihood of achieving goals, and potential problems during recovery for the procedure that I have provided informed consent. Date  Time: 12/15/2023 12:51 PM  Pre-Procedure Preparation:  Monitoring: As per clinic protocol. Respiration, ETCO2, SpO2, BP, heart rate and rhythm monitor placed and checked for adequate function Safety Precautions: Patient was assessed for positional comfort and pressure points before starting the procedure. Time-out: I initiated and conducted the "Time-out" before starting the procedure, as per protocol. The patient was asked to participate by confirming the accuracy of the "Time Out" information. Verification of the correct person,  site, and procedure were performed and confirmed by me, the nursing staff, and the patient. "Time-out" conducted as per Joint Commission's Universal Protocol (UP.01.01.01). Time:   Start Time:   hrs.  Description of Procedure:          Position: Supine Target Area: Central-port of intrathecal pump. Approach: Anterior, 90 degree angle approach. Area Prepped: Entire Area around the pump implant. ChloraPrep (2% chlorhexidine  gluconate and 70% isopropyl alcohol ) Safety Precautions: Aspiration looking for blood return was conducted prior to all injections. At no point did we inject any substances, as a needle was being advanced. No attempts were made at seeking any paresthesias. Safe injection practices and needle disposal techniques used. Medications properly checked for expiration dates. SDV (single dose vial) medications used. Description of the Procedure: Protocol guidelines were followed. Two nurses trained to do implant refills were present during the entire procedure. The refill medication was checked by both healthcare providers as well as the patient. The patient was included in the "Time-out" to verify the medication. The patient was placed in position. The pump was identified. The area was prepped in the usual manner. The sterile template was positioned over the pump, making sure the side-port location matched that of the pump. Both, the pump  and the template were held for stability. The needle provided in the Medtronic Kit was then introduced thru the center of the template and into the central port. The pump content was aspirated and discarded volume documented. The new medication was slowly infused into the pump, thru the filter, making sure to avoid overpressure of the device. The needle was then removed and the area cleansed, making sure to leave some of the prepping solution back to take advantage of its long term bactericidal properties. The pump was interrogated and programmed to reflect the  correct medication, volume, and dosage. The program was printed and taken to the physician for approval. Once checked and signed by the physician, a copy was provided to the patient and another scanned into the EMR.  Vitals:   12/15/23 1250  BP: (!) 126/58  Pulse: (!) 102  Resp: 20  Temp: (!) 96.6 F (35.9 C)  SpO2: 100%  Weight: 142 lb (64.4 kg)  Height: 4\' 10"  (1.473 m)    Start Time:   hrs. End Time:   hrs. Materials & Medications: Medtronic Refill Kit Medication(s): Please see chart orders for details.  Type of Imaging Technique: None used Indication(s): N/A Exposure Time: No patient exposure Contrast: None used. Fluoroscopic Guidance: N/A Ultrasound Guidance: N/A Interpretation: N/A  Antibiotic Prophylaxis:   Anti-infectives (From admission, onward)    None      Indication(s): None identified   Post-operative Assessment:  Post-procedure Vital Signs:  Pulse/HCG Rate: (!) 102  Temp: (!) 96.6 F (35.9 C) Resp: 20 (O2@ 2L) BP: (!) 126/58 SpO2: 100 %  EBL: None  Complications: No immediate post-treatment complications observed by team, or reported by patient.  Note: The patient tolerated the entire procedure well. A repeat set of vitals were taken after the procedure and the patient was kept under observation following institutional policy, for this type of procedure. Post-procedural neurological assessment was performed, showing return to baseline, prior to discharge. The patient was provided with post-procedure discharge instructions, including a section on how to identify potential problems. Should any problems arise concerning this procedure, the patient was given instructions to immediately contact us , at any time, without hesitation. In any case, we plan to contact the patient by telephone for a follow-up status report regarding this interventional procedure.  Comments:  No additional relevant information.  Plan of Care (POC)  Orders:  Orders Placed This  Encounter  Procedures   PUMP REFILL    Maintain Protocol by having two(2) healthcare providers during procedure and programming.    Scheduling Instructions:     Please refill intrathecal pump today.    Where will this procedure be performed?:   [facility]   PUMP REFILL    Whenever possible schedule on a procedure today.    Standing Status:   Future    Expiration Date:   04/16/2024    Scheduling Instructions:     Please schedule intrathecal pump refill based on pump programming. Avoid schedule intervals of more than 120 days (4 months).    Where will this procedure be performed?:   [facility]   Remove and insert drug implant    Standing Status:   Future    Expiration Date:   06/16/2024    Scheduling Instructions:     Please schedule an intrathecal pump implant in same day surgery. Contact the company representative and schedule them to be available in the OR for procedure.   Informed Consent Details: Physician/Practitioner Attestation; Transcribe to consent form  and obtain patient signature    Transcribe to consent form and obtain patient signature.    Physician/Practitioner attestation of informed consent for procedure/surgical case:   I, the physician/practitioner, attest that I have discussed with the patient the benefits, risks, side effects, alternatives, likelihood of achieving goals and potential problems during recovery for the procedure that I have provided informed consent.    Procedure:   Intrathecal pump refill    Physician/Practitioner performing the procedure:   Attending Physician: Tessy Pawelski A. Barth Borne, MD & designated trained staff    Indication/Reason:   Chronic Pain Syndrome (G89.4), presence of an intrathecal pump (Z97.8)   Nursing Instructions:    1. Medication Agreement: Please go over agreement with the patient. Have the patient read and sign the agreement. Provide patient with a copy of the signed agreement. 2. Make sure that the patient has  completed the ORT (Opioid Risk Tool). 3. Provide the patient with a copy of our "Medicatiion Policy", "Medication Recommendations and Reminders", and "CBD information". 4. Remind the patient to always bring their medications and medication bottles (even if empty) to all appointments except for procedure appointments.    Scheduling Instructions:     Sign "Medication Agreement", complete "Opioid Risk Tool", inform patient of our practice "Medication Policies" (Pill counts, always bring bottles, except on procedure days).   Blood Thinner Instructions to Nursing    Always make sure patient has clearance from prescribing physician to stop blood thinners for interventional therapies. If the patient requires a Lovenox -bridge therapy, make sure arrangements are made to institute it with the assistance of the PCP.    Scheduling Instructions:     Have Ms. Eugene stop the Eliquis  (Apixaban ) x 3 days prior to procedure or surgery.   Chronic Opioid Analgesic:  morphine  sulfate powder (for intrathecal pump use)  MME/day: 2.5 mg/day (Intrathecal)   Medications ordered for procedure: No orders of the defined types were placed in this encounter.  Medications administered: [name] had no medications administered during this visit.  See the medical record for exact dosing, route, and time of administration.  Follow-up plan:   Return for Pump Refill (Max:32mo).       Interventional Therapies  Risk Factors  Considerations  Medical Comorbidities:  Eliquis  Anticoagulation: (Stop: 3 days  Restart: 6 hours)  A-Fib  Hx of PE  CHF w/ Reduced EF  Cardiomyopathy  Stage 3a CKD  COPD on O2  GERD  T2IDDM  OSA     Planned  Pending:   Revision and replacement of intrathecal pump reservoir secondary to battery depletion. (Before July 2025)    Under consideration:   Revision and replacement of intrathecal pump reservoir secondary to battery depletion. (Before July 2025)  Intrathecal pump  refill and management.   Completed:   Surgery: Intrathecal pump implant #1 (*) by Sandralee Crow, MD    Therapeutic  Palliative (PRN) options:   None established   Completed by other providers:   Surgery: Intrathecal pump replacement (05/27/2017) by Ava Boatman, MD ([facility])      Recent Visits Date Type Provider Dept  10/08/23 Procedure visit Renaldo Caroli, MD [facility]  Showing recent visits within past 90 days and meeting all other requirements Today's Visits Date Type Provider Dept  12/15/23 Procedure visit Renaldo Caroli, MD [facility]  Showing today's visits and meeting all other requirements Future Appointments No visits were found meeting these conditions. Showing future appointments within next 90 days and meeting all other  requirements  Disposition: Discharge home  Discharge (Date  Time): 12/15/2023; 1354 hrs.   Primary Care Physician: [name], Jolene T, NP Location: [facility] Note by: Candi Chafe, MD (TTS technology used. I apologize for any typographical errors that were not detected and corrected.) Date: 12/15/2023; Time: 2:04 PM  Disclaimer:  Medicine is not an Visual merchandiser. The only guarantee in medicine is that nothing is guaranteed. It is important to note that the decision to proceed with this intervention was based on the information collected from the patient. The Data and conclusions were drawn from the patient's questionnaire, the interview, and the physical examination. Because the information was provided in large part by the patient, it cannot be guaranteed that it has not been purposely or unconsciously manipulated. Every effort has been made to obtain as much relevant data as possible for this evaluation. It is important to note that the conclusions that lead to this procedure are derived in large part from the available data. Always take into account  that the treatment will also be dependent on availability of resources and existing treatment guidelines, considered by other Pain Management Practitioners as being common knowledge and practice, at the time of the intervention. For Medico-Legal purposes, it is also important to point out that variation in procedural techniques and pharmacological choices are the acceptable norm. The indications, contraindications, technique, and results of the above procedure should only be interpreted and judged by a Board-Certified Interventional Pain Specialist with extensive familiarity and expertise in the same exact procedure and technique.

## 2023-12-15 NOTE — Telephone Encounter (Signed)
 Dr Barth Borne would like clearance to stop Eliquis  for 3 days prior to the patient having her Intrathecal pump replaced. Thank you

## 2023-12-16 ENCOUNTER — Other Ambulatory Visit: Payer: Self-pay | Admitting: Nurse Practitioner

## 2023-12-16 ENCOUNTER — Ambulatory Visit (INDEPENDENT_AMBULATORY_CARE_PROVIDER_SITE_OTHER): Admitting: Gastroenterology

## 2023-12-16 ENCOUNTER — Telehealth: Payer: Self-pay | Admitting: *Deleted

## 2023-12-16 VITALS — BP 126/70 | HR 91 | Temp 97.6°F | Wt 142.0 lb

## 2023-12-16 DIAGNOSIS — K5901 Slow transit constipation: Secondary | ICD-10-CM | POA: Diagnosis not present

## 2023-12-16 MED FILL — Medication: INTRATHECAL | Qty: 1 | Status: AC

## 2023-12-16 NOTE — Telephone Encounter (Signed)
 No problems post IT pump refill.

## 2023-12-16 NOTE — Progress Notes (Signed)
 Karma Oz, MD [address]  [address]  [place], [place] [address]  Main: [phone]  Fax: [phone]    Gastroenterology Consultation  Referring Provider:     Lemar Pyles, NP Primary Care Physician:  Lemar Pyles, NP Primary Gastroenterologist:  Dr. Karma Oz Reason for Consultation: Chronic constipation        HPI:   Jordan Chan is a 73 y.o. female referred by Lemar Pyles, NP  for consultation & management of chronic constipation.  She has been suffering from constipation since her childhood.  She was last seen by Brigitte Canard, was advised to try Linzess of various doses.  Patient states nothing has worked.  She takes magnesium  citrate and Senokot about once a week or so to have a good bowel movement.  She does denies any abdominal bloating, abdominal pain.  She tries to avoid impaction.  She tries to eat healthy.  She knows how to manage her constipation and prevent worsening.  She does not have any concerns today.  NSAIDs: None  Antiplts/Anticoagulants/Anti thrombotics: None  GI Procedures:  12/2021 EGD by Dr. Baldomero Bone: Mild erythema in the gastric antrum, otherwise normal.  Biopsies negative for H. Pylori and Barretts.   12/2021 colonoscopy by Dr. Baldomero Bone: Poor prep (after taking 2 day prep).  Stool in the entire colon.  Repeat colonoscopy was recommended in 6 months with 2-day prep.   12/2016 colonoscopy: Also showed poor prep. 12/2016 EGD: Normal  Past Medical History:  Diagnosis Date   Arthritis    Asthma    Atrial fibrillation (HCC)    Breast cancer (HCC) 1998   right breast ca with mastectomy and chemotherapy and radiation   Bronchitis    CHF (congestive heart failure) (HCC)    "with Morphine  withdrawal"   COPD (chronic obstructive pulmonary disease) (HCC)    Diabetes mellitus without complication (HCC)    Diverticulitis    diverticulosis also   Dyspnea    Endometriosis    GERD (gastroesophageal reflux disease)    History  of shingles 2000-2005   Hypercholesteremia    Hypertension    IBS (irritable bowel syndrome)    Low back pain    a. Implanted morphine /bupivicaine/clonidine  pump.   Neuropathy    Orthopnea    Oxygen  dependent    uses at night   Personal history of chemotherapy    Personal history of radiation therapy    Pneumonia    pneumonia 5-6 times, history of bronchitis also   Scoliosis    Sleep apnea    does not use cpap   Stroke (HCC) 2010   TIA, 10 years ago   Withdrawal from sedative drug (HCC)    withdrawal from morphine  when pump batteries died    Past Surgical History:  Procedure Laterality Date   ABDOMINAL HYSTERECTOMY  1987   BACK SURGERY     Tailbone removed following fracture   BREAST SURGERY Right    mastectomy   CARDIAC CATHETERIZATION     CATARACT EXTRACTION W/PHACO Right 05/19/2019   Procedure: CATARACT EXTRACTION PHACO AND INTRAOCULAR LENS PLACEMENT (IOC), RIGHT, DIABETIC;  Surgeon: Ola Berger, MD;  Location: [facility] ORS;  Service: Ophthalmology;  Laterality: Right;  Lot # [id number] US : 00:43.8 CDE: 4.59   CATARACT EXTRACTION W/PHACO Left 06/16/2019   Procedure: CATARACT EXTRACTION PHACO AND INTRAOCULAR LENS PLACEMENT (IOC) LEFT VISION BLUE DIABETIC;  Surgeon: Ola Berger, MD;  Location: [facility] ORS;  Service: Ophthalmology;  Laterality: Left;  Lot #[id number]  US : 00:46.9 CDE: 6.53   COCCYX REMOVAL     COLONOSCOPY WITH PROPOFOL  N/A 01/01/2017   Procedure: COLONOSCOPY WITH PROPOFOL ;  Surgeon: Luke Salaam, MD;  Location: [facility] ENDOSCOPY;  Service: Endoscopy;  Laterality: N/A;   COLONOSCOPY WITH PROPOFOL  N/A 12/11/2021   Procedure: COLONOSCOPY WITH PROPOFOL ;  Surgeon: Selena Daily, MD;  Location: [facility] ENDOSCOPY;  Service: Gastroenterology;  Laterality: N/A;   ELBOW ARTHROSCOPY WITH TENDON RECONSTRUCTION     ESOPHAGOGASTRODUODENOSCOPY (EGD) WITH PROPOFOL  N/A 01/01/2017   Procedure: ESOPHAGOGASTRODUODENOSCOPY (EGD) WITH PROPOFOL ;  Surgeon: Luke Salaam, MD;  Location:  [facility] ENDOSCOPY;  Service: Endoscopy;  Laterality: N/A;   ESOPHAGOGASTRODUODENOSCOPY (EGD) WITH PROPOFOL  N/A 12/11/2021   Procedure: ESOPHAGOGASTRODUODENOSCOPY (EGD) WITH PROPOFOL ;  Surgeon: Selena Daily, MD;  Location: [facility] ENDOSCOPY;  Service: Gastroenterology;  Laterality: N/A;   INTRATHECAL PUMP IMPLANT     LEFT HEART CATH AND CORONARY ANGIOGRAPHY N/A 04/28/2017   Procedure: LEFT HEART CATH AND CORONARY ANGIOGRAPHY;  Surgeon: Sammy Crisp, MD;  Location: [facility] INVASIVE CV LAB;  Service: Cardiovascular;  Laterality: N/A;   MASTECTOMY Right 06/1997   morphine  pump  2011   PLANTAR FASCIA RELEASE     TOTAL HIP ARTHROPLASTY Left    TRIGGER FINGER RELEASE       Current Outpatient Medications:    ACCU-CHEK AVIVA PLUS test strip, TEST THREE TIMES DAILY, Disp: 300 strip, Rfl: 3   Accu-Chek Softclix Lancets lancets, TEST BLOOD SUGAR THREE TIMES DAILY, Disp: 300 each, Rfl: 3   albuterol  (PROVENTIL ) (2.5 MG/3ML) 0.083% nebulizer solution, Take 3 mLs (2.5 mg total) by nebulization every 4 (four) hours as needed for wheezing or shortness of breath., Disp: 75 mL, Rfl: 1   albuterol  (VENTOLIN  HFA) 108 (90 Base) MCG/ACT inhaler, Inhale 2 puffs into the lungs every 6 (six) hours as needed for wheezing or shortness of breath., Disp: 1 each, Rfl: 1   alum & mag hydroxide-simeth (MAALOX/MYLANTA) 200-200-20 MG/5ML suspension, Take 30 mLs by mouth every 6 (six) hours as needed for indigestion or heartburn., Disp: 300 mL, Rfl: 1   apixaban  (ELIQUIS ) 5 MG TABS tablet, Take 1 tablet (5 mg total) by mouth 2 (two) times daily., Disp: 60 tablet, Rfl: 0   atorvastatin  (LIPITOR) 40 MG tablet, Take 1 tablet (40 mg total) by mouth daily., Disp: 30 tablet, Rfl: 1   Blood Glucose Monitoring Suppl (TRUE METRIX METER) w/Device KIT, Use to check blood sugar 4 times a day, Disp: 1 kit, Rfl: 4   busPIRone  (BUSPAR ) 5 MG tablet, Take 1 tablet (5 mg total) by mouth 2 (two) times daily., Disp: 30 tablet, Rfl: 1    cholecalciferol  (VITAMIN D3) 25 MCG (1000 UNIT) tablet, Take 1,000 Units by mouth daily., Disp: , Rfl:    Continuous Glucose Receiver (FREESTYLE LIBRE 2 READER) DEVI, To check blood sugars 3-4 times daily due to insulin  use. DX E11.40, Disp: 2 each, Rfl: 5   Continuous Glucose Sensor (FREESTYLE LIBRE 2 SENSOR) MISC, USE 1 SENSOR TO CHECK BLOOD SUGARS 3-4 TIMES DAILY DUE TO INSULIN  USE, Disp: 2 each, Rfl: 5   Cyanocobalamin  1000 MCG/ML KIT, Inject 1,000 mcg as directed every 30 (thirty) days. Next injection 3/21, Disp: , Rfl:    cyclobenzaprine  (FLEXERIL ) 5 MG tablet, Take 5 mg by mouth daily as needed for muscle spasms., Disp: , Rfl:    dapagliflozin  propanediol (FARXIGA ) 10 MG TABS tablet, Take 1 tablet (10 mg total) by mouth daily before breakfast., Disp: 90 tablet, Rfl: 3   docusate sodium  (COLACE) 100 MG  capsule, Take 1 capsule (100 mg total) by mouth 2 (two) times daily as needed for mild constipation., Disp: 30 capsule, Rfl: 0   feeding supplement (ENSURE ENLIVE / ENSURE PLUS) LIQD, Take 237 mLs by mouth 3 (three) times daily between meals., Disp: 237 mL, Rfl: 12   ferrous sulfate  325 (65 FE) MG EC tablet, Take 1 tablet (325 mg total) by mouth 2 (two) times daily., Disp: 60 tablet, Rfl: 1   folic acid  (FOLVITE ) 1 MG tablet, TAKE 1 TABLET EVERY DAY, Disp: 90 tablet, Rfl: 0   furosemide  (LASIX ) 40 MG tablet, Take 0.5 tablets (20 mg total) by mouth daily., Disp: 30 tablet, Rfl: 1   gabapentin  (NEURONTIN ) 400 MG capsule, Take 1 capsule (400 mg total) by mouth 3 (three) times daily., Disp: 90 capsule, Rfl: 1   HYDROcodone -acetaminophen  (NORCO/VICODIN) 5-325 MG tablet, Take 1 tablet by mouth every 4 (four) hours as needed., Disp: , Rfl:    insulin  lispro (HUMALOG  KWIKPEN) 100 UNIT/ML KwikPen, Inject 5 Units into the skin 3 (three) times daily before meals. Do not inject insulin  if you are not going to eat meal.  Only take before a meal., Disp: 15 mL, Rfl: 3   linaclotide (LINZESS) 145 MCG CAPS capsule,  Take 145 mcg by mouth daily before breakfast., Disp: , Rfl:    losartan  (COZAAR ) 25 MG tablet, Take 0.5 tablets (12.5 mg total) by mouth daily., Disp: 30 tablet, Rfl: 1   metFORMIN  (GLUCOPHAGE ) 1000 MG tablet, Take 1 tablet (1,000 mg total) by mouth 2 (two) times daily with a meal., Disp: 60 tablet, Rfl: 1   metoprolol  succinate (TOPROL -XL) 25 MG 24 hr tablet, Take 1 tablet (25 mg total) by mouth daily. Take with or immediately following a meal., Disp: 30 tablet, Rfl: 11   metoprolol  tartrate (LOPRESSOR ) 25 MG tablet, Take 12.5 mg by mouth 2 (two) times daily., Disp: , Rfl:    montelukast  (SINGULAIR ) 10 MG tablet, Take 1 tablet (10 mg total) by mouth at bedtime., Disp: 30 tablet, Rfl: 0   naloxone  (NARCAN ) nasal spray 4 mg/0.1 mL, Place 1 spray into the nose as needed for up to 365 doses (for opioid-induced respiratory depresssion). In case of emergency (overdose), spray once into each nostril. If no response within 3 minutes, repeat application and call 911., Disp: 1 each, Rfl: 1   Nebulizers (EASY AIR COMPRESSOR NEBULIZER) MISC, Use to inhaler nebulizer treatments at needed per instructions on nebulizer prescription, Disp: 1 each, Rfl: 0   ondansetron  (ZOFRAN ) 4 MG tablet, Take 1 tablet (4 mg total) by mouth daily as needed for nausea or vomiting., Disp: 30 tablet, Rfl: 1   OXYGEN , Inhale 2 L into the lungs at bedtime., Disp: , Rfl:    PAIN MANAGEMENT INTRATHECAL, IT, PUMP, 1 each by Intrathecal route. Intrathecal (IT) medication:  Morphine  5.0 mg/ml, Bupivacaine 20.0 mg/ml,  Clonidine  100.0 mcg/ml Patient does not remember current. Adjusted every 3 months at [facility], Disp: , Rfl:    pantoprazole  (PROTONIX ) 40 MG tablet, Take 1 tablet (40 mg total) by mouth daily., Disp: 30 tablet, Rfl: 1   polyethylene glycol (MIRALAX  / GLYCOLAX ) 17 g packet, Take 17 g by mouth daily as needed for moderate constipation., Disp: 10 packet, Rfl: 0   predniSONE  (STERAPRED UNI-PAK 21 TAB) 10 MG (21) TBPK  tablet, Take as instructed on packaging., Disp: 21 each, Rfl: 0   TRELEGY ELLIPTA  100-62.5-25 MCG/ACT AEPB, INHALE 1 PUFF INTO THE LUNGS DAILY . 42 DAY EXPIRATION AFTER OPENING  FOIL TRAY, Disp: 180 each, Rfl: 3   TRESIBA  FLEXTOUCH 100 UNIT/ML FlexTouch Pen, INJECT 55 UNITS SUBCUTANEOUSLY AT BEDTIME, Disp: 30 mL, Rfl: 0   TRULICITY  4.5 MG/0.5ML SOPN, INJECT 4.5MG  INTO THE SKIN ONCE WEEKLY AS DIRECTED (Patient taking differently: Wednesdays), Disp: 12 mL, Rfl: 0   venlafaxine  XR (EFFEXOR -XR) 150 MG 24 hr capsule, Take 1 capsule (150 mg total) by mouth daily., Disp: 30 capsule, Rfl: 0   Family History  Problem Relation Age of Onset   Cancer Mother 1       lung   Thyroid  disease Mother    Cancer Father 28       Pancreatic   Hypertension Sister    Cancer Sister        "cancer on face"   Hyperlipidemia Sister    Osteoporosis Maternal Grandmother    Cancer Paternal Grandmother        colon   Arthritis Paternal Grandfather    Hyperlipidemia Son    Seizures Son    Breast cancer Neg Hx      Social History   Tobacco Use   Smoking status: Former    Current packs/day: 0.00    Average packs/day: 1 pack/day for 30.0 years (30.0 ttl pk-yrs)    Types: Cigarettes    Start date: 04/30/1974    Quit date: 04/30/2004    Years since quitting: 19.6   Smokeless tobacco: Never  Vaping Use   Vaping status: Never Used  Substance Use Topics   Alcohol  use: No   Drug use: No    Allergies as of 12/16/2023 - Review Complete 12/16/2023  Allergen Reaction Noted   Other Palpitations 03/19/2015   Pain patch [menthol] Anaphylaxis 03/19/2015   Jardiance  [empagliflozin ] Other (See Comments) 08/28/2023   Prednisone  Other (See Comments) 12/27/2012   Avelox  [moxifloxacin  hcl in nacl] Other (See Comments) 06/18/2015   Doxycycline  Diarrhea 06/12/2017   Erythromycin  Nausea Only and Other (See Comments) 01/13/2014   Fentanyl  Nausea Only and Rash 06/18/2015   Moxifloxacin  hcl Other (See Comments) 06/18/2015    Oxycontin [oxycodone] Hives 10/01/2015   Ozempic [semaglutide] Nausea Only 02/05/2018    Review of Systems:    All systems reviewed and negative except where noted in HPI.   Physical Exam:  BP 126/70 (BP Location: Left Arm, Patient Position: Sitting, Cuff Size: Normal)   Pulse 91   Temp 97.6 F (36.4 C) (Oral)   Wt 142 lb (64.4 kg)   LMP  (LMP Unknown)   BMI 29.68 kg/m  No LMP recorded (lmp unknown). Patient has had a hysterectomy.  General:   Alert,  Well-developed, well-nourished, pleasant and cooperative in NAD Head:  Normocephalic and atraumatic. Eyes:  Sclera clear, no icterus.   Conjunctiva pink. Ears:  Normal auditory acuity. Nose:  No deformity, discharge, or lesions. Mouth:  No deformity or lesions,oropharynx pink & moist. Neck:  Supple; no masses or thyromegaly. Lungs:  Respirations even and unlabored.  Clear throughout to auscultation.   No wheezes, crackles, or rhonchi. No acute distress. Heart:  Regular rate and rhythm; no murmurs, clicks, rubs, or gallops. Abdomen:  Normal bowel sounds. Soft, non-tender and non-distended without masses, hepatosplenomegaly or hernias noted.  No guarding or rebound tenderness.   Rectal: Not performed Msk:  Symmetrical without gross deformities. Good, equal movement & strength bilaterally. Pulses:  Normal pulses noted. Extremities:  No clubbing or edema.  No cyanosis. Neurologic:  Alert and oriented x3;  grossly normal neurologically. Skin:  Intact without significant lesions or rashes.  No jaundice. Psych:  Alert and cooperative. Normal mood and affect.  Imaging Studies: Reviewed  Assessment and Plan:   Jordan Chan is a 73 y.o. pleasant Caucasian female with A-fib on Eliquis , CHF, COPD, metabolic syndrome, oxygen  dependent is seen in consultation for follow-up of chronic constipation.  Linzess high-dose did not help.  Patient has slow transit constipation.  She does not have any urge to use the bathroom.  This is all her  life.  She now knows how to keep it manageable without interrupting her daily life.  She knows how to prevent fecal impactions.  I do not recommend to try any medications at this time. She will continue following healthy diet, adequate intake of water  and bowel cleanouts regularly  Colon cancer screening Attempted multiple colonoscopies in the past with poor prep due to slow motility Discussed with patient regarding risks and benefits of other screening modalities such as Cologuard or FIT testing or CT colonography.  If the Cologuard or FIT testing comes back positive, then she has to undergo colonoscopy and if she wants to find out whether or not she has colon cancer.  Patient said she will think about it and I have advised her to discuss with her PCP if she decides to undergo noninvasive colon cancer screening test Given her multiple comorbidities, patient may not want to undergo any colon cancer screening test at this time  Follow up as needed   Karma Oz, MD

## 2023-12-18 ENCOUNTER — Ambulatory Visit (INDEPENDENT_AMBULATORY_CARE_PROVIDER_SITE_OTHER): Payer: Medicare HMO | Admitting: Nurse Practitioner

## 2023-12-18 ENCOUNTER — Encounter: Payer: Self-pay | Admitting: Nurse Practitioner

## 2023-12-18 VITALS — BP 123/70 | HR 83 | Temp 97.6°F | Ht 58.2 in | Wt 142.0 lb

## 2023-12-18 DIAGNOSIS — E119 Type 2 diabetes mellitus without complications: Secondary | ICD-10-CM

## 2023-12-18 DIAGNOSIS — J449 Chronic obstructive pulmonary disease, unspecified: Secondary | ICD-10-CM

## 2023-12-18 DIAGNOSIS — Z794 Long term (current) use of insulin: Secondary | ICD-10-CM

## 2023-12-18 DIAGNOSIS — E1142 Type 2 diabetes mellitus with diabetic polyneuropathy: Secondary | ICD-10-CM | POA: Diagnosis not present

## 2023-12-18 DIAGNOSIS — I4819 Other persistent atrial fibrillation: Secondary | ICD-10-CM

## 2023-12-18 DIAGNOSIS — E1159 Type 2 diabetes mellitus with other circulatory complications: Secondary | ICD-10-CM

## 2023-12-18 DIAGNOSIS — I152 Hypertension secondary to endocrine disorders: Secondary | ICD-10-CM

## 2023-12-18 DIAGNOSIS — E538 Deficiency of other specified B group vitamins: Secondary | ICD-10-CM

## 2023-12-18 DIAGNOSIS — F1120 Opioid dependence, uncomplicated: Secondary | ICD-10-CM

## 2023-12-18 DIAGNOSIS — G4733 Obstructive sleep apnea (adult) (pediatric): Secondary | ICD-10-CM

## 2023-12-18 DIAGNOSIS — F324 Major depressive disorder, single episode, in partial remission: Secondary | ICD-10-CM

## 2023-12-18 DIAGNOSIS — I428 Other cardiomyopathies: Secondary | ICD-10-CM

## 2023-12-18 DIAGNOSIS — N1831 Chronic kidney disease, stage 3a: Secondary | ICD-10-CM

## 2023-12-18 DIAGNOSIS — E1169 Type 2 diabetes mellitus with other specified complication: Secondary | ICD-10-CM | POA: Diagnosis not present

## 2023-12-18 DIAGNOSIS — I5022 Chronic systolic (congestive) heart failure: Secondary | ICD-10-CM

## 2023-12-18 LAB — BAYER DCA HB A1C WAIVED: HB A1C (BAYER DCA - WAIVED): 7.5 % — ABNORMAL HIGH (ref 4.8–5.6)

## 2023-12-18 MED ORDER — MONTELUKAST SODIUM 10 MG PO TABS: 10.0000 mg | ORAL_TABLET | Freq: Every day | ORAL | 3 refills | Status: AC

## 2023-12-18 MED ORDER — PANTOPRAZOLE SODIUM 40 MG PO TBEC: 40.0000 mg | DELAYED_RELEASE_TABLET | Freq: Every day | ORAL | 3 refills | Status: AC

## 2023-12-18 MED ORDER — LOSARTAN POTASSIUM 25 MG PO TABS: 12.5000 mg | ORAL_TABLET | Freq: Every day | ORAL | 3 refills | Status: DC

## 2023-12-18 MED ORDER — METFORMIN HCL 1000 MG PO TABS: 1000.0000 mg | ORAL_TABLET | Freq: Two times a day (BID) | ORAL | 3 refills | Status: AC

## 2023-12-18 MED ORDER — GABAPENTIN 400 MG PO CAPS: 400.0000 mg | ORAL_CAPSULE | Freq: Three times a day (TID) | ORAL | 12 refills | Status: AC

## 2023-12-18 MED ORDER — INSULIN LISPRO (1 UNIT DIAL) 100 UNIT/ML (KWIKPEN)
5.0000 [IU] | PEN_INJECTOR | Freq: Three times a day (TID) | SUBCUTANEOUS | 3 refills | Status: AC
Start: 2023-12-18 — End: ?

## 2023-12-18 MED ORDER — TRULICITY 4.5 MG/0.5ML ~~LOC~~ SOAJ: 4.5000 mg | SUBCUTANEOUS | 3 refills | Status: AC

## 2023-12-18 MED ORDER — APIXABAN 5 MG PO TABS: 5.0000 mg | ORAL_TABLET | Freq: Two times a day (BID) | ORAL | 3 refills | Status: AC

## 2023-12-18 MED ORDER — TRESIBA FLEXTOUCH 100 UNIT/ML ~~LOC~~ SOPN: 55.0000 [IU] | PEN_INJECTOR | Freq: Every day | SUBCUTANEOUS | 5 refills | Status: AC

## 2023-12-18 MED ORDER — VENLAFAXINE HCL ER 150 MG PO CP24: 150.0000 mg | ORAL_CAPSULE | Freq: Every day | ORAL | 3 refills | Status: DC

## 2023-12-18 MED ORDER — ATORVASTATIN CALCIUM 40 MG PO TABS: 40.0000 mg | ORAL_TABLET | Freq: Every day | ORAL | 3 refills | Status: AC

## 2023-12-18 NOTE — Assessment & Plan Note (Signed)
 Ongoing and stable recent labs.  Continue Losartan  at low dose for heart and kidney protection + Farxiga .  Labs CBC and CMP.  Will refer to nephrology if any worsening in future.  May need to reduce Metformin  if eGFR <45.

## 2023-12-18 NOTE — Assessment & Plan Note (Addendum)
 Chronic, ongoing with last A1c 7.5% today, slight trend up due to recent surgery and steroids.  Urine ALB 80 February 2025. Continue current regimen and adjust as needed.  Did not tolerate Jardiance  in past + Mounjaro  made her sick.  Is tolerating Farxiga .  She monitors sugar levels frequently throughout daytime hours. - Vaccines up to date - Foot exam up to date.  Eye exam up to date. - ARB and statin on board. - Will continue Metformin  at max dosing, Trulicity  4.5 MG weekly, Farxiga , and Tresiba  55 units, increase 3 units in 3 days if fasting sugar not less then 130 consistently recommended.  Continue meal time insulin , 2-5 units prior to meals and will increase as needed.  Educated her on this.   - Freestyle use continues. - Recommend she continue to monitor BS consistently at home and document + focus heavily on diet changes.  She is aware to notify provider if fasting BS >130 consistently or <70, as may need to adjust insulin  further.

## 2023-12-18 NOTE — Assessment & Plan Note (Signed)
 Chronic, ongoing.  Denies SI/HI.  Mood well-controlled at this time.  Continue current medication regimen and adjust as needed.

## 2023-12-18 NOTE — Assessment & Plan Note (Signed)
Chronic.  Poor tolerance of CPAP mask, uses O2 night 2 L [place]. 

## 2023-12-18 NOTE — Assessment & Plan Note (Signed)
 Chronic, ongoing with most recent 08/11/23 was 25-30%, a decline from previous EF 45-50% (10/22/22), followed by cardiology and HF Clinic.  Euvolemic today.  Continue current medication regimen and collaboration with cardiology.  Recommend: - Reminded to call for an overnight weight gain of >2 pounds or a weekly weight gain of >5 pounds - not adding salt to food and read food labels. Reviewed the importance of keeping daily sodium intake to 2000mg  daily - Avoid Ibuprofen products

## 2023-12-18 NOTE — Assessment & Plan Note (Signed)
 Chronic, stable.  Rate well controlled.  Continue current medications and collaboration with cardiology.

## 2023-12-18 NOTE — Assessment & Plan Note (Signed)
 Chronic, stable.  Continue current medication regimen and adjust as needed.  Lipid panel today.

## 2023-12-18 NOTE — Assessment & Plan Note (Signed)
 Chronic, ongoing in setting of PE's in past.  Continue current medication regimen and collaboration with cardiology.  Recent notes reviewed.

## 2023-12-18 NOTE — Assessment & Plan Note (Signed)
 Chronic, stable.  BP well below goal in office today.  Recommend she continue to monitor BP at home regularly.  Focus on DASH diet.  Continue current medication regimen and adjust as needed, refills sent as needed.  LABS: CBC, CMP.  Urine ALB 80 February 2025.  Return in 3 months.

## 2023-12-18 NOTE — Assessment & Plan Note (Signed)
Chronic, stable.  B12 level obtained today, continue injections monthly. 

## 2023-12-18 NOTE — Assessment & Plan Note (Signed)
 Chronic, stable. Rare use of Albuterol. Continue Trelegy which offers benefit of medication minimization and has benefited symptoms.  Continue to collaborate with Dr. Meredeth Ide, recent notes reviewed.  Continue O2 use as ordered.

## 2023-12-18 NOTE — Progress Notes (Signed)
 BP 123/70   Pulse 83   Temp 97.6 F (36.4 C) (Oral)   Ht 4' 10.2" (1.478 m)   Wt 142 lb (64.4 kg)   LMP  (LMP Unknown)   SpO2 94%   BMI 29.47 kg/m    Subjective:    [name], female    DOB: [birth year], 73 y.o.   MRN: [id number]  HPI: Jordan Chan is a 73 y.o. female  Chief Complaint  Patient presents with   Chronic Kidney Disease   COPD   Depression   Diabetes   Hyperlipidemia   Hypertension   DIABETES A1c in February was 6.4%. Takes Metformin  1000 MG BID, Trulicity  4.5 MG weekly, Tresiba  55 units at HS, Farxiga , and meal time insulin  6 units.  Did recently go through surgery and had steroids.  History: Jardiance  made her sick in past and Mounjaro  gave her severe diarrhea and abdominal pain. Works with PharmD as needed for assist.   Hypoglycemic episodes:yes x one episode that was 69 Polydipsia/polyuria: no Visual disturbance: no Chest pain: no Paresthesias: no Glucose Monitoring: yes  Accucheck frequency: BID  Fasting glucose: 245 this morning -- on average has been 130 to 160  Post prandial: 240 to 310 (this is right after meals)  Evening:  Before meals: Taking Insulin ?: yes  Long acting insulin : 55 units  Short acting insulin : 6 units Blood Pressure Monitoring: monthly Retinal Examination: Up To Date -- [facility] Foot Exam: Up to Date Pneumovax: Up to Date Influenza: Up to Date Aspirin : no   HYPERTENSION / HYPERLIPIDEMIA Taking Atorvastatin , Metoprolol , Lasix , Losartan , Eliquis , + ASA. Saw cardiology last 06/25/23.   Echo 08/11/23 showed EF 25-30%, left ventricle with severe decreased function. Satisfied with current treatment? yes Duration of hypertension: chronic BP monitoring frequency: a few times a week BP range: 110-120/70 range BP medication side effects: no:  Duration of hyperlipidemia: chronic Cholesterol medication side effects: no Cholesterol supplements: none Medication compliance: good compliance Aspirin :  no Recent stressors: no Recurrent headaches: no Visual changes: no Palpitations: no Dyspnea: baseline with no worsening Chest pain: no Lower extremity edema: no Dizzy/lightheaded: no   COPD Using Trelegy, Albuterol , and Singulair .  Saw pulmonary last on 12/07/23 with no changes.  At next visit they plan on doing a walk test to see if she can get rid of oxygen .   Has a history of smoking, quit 15 years ago. She does not use CPAP, but continues to use O2 at night 2 L.  Sleeps in recliner at baseline, has since 1998.  Currently sleeps in hospital bed due to hip surgery. COPD status: stable Satisfied with current treatment?: yes Oxygen  use: yes 2L Dyspnea frequency: baseline with no worsening Cough frequency: no  Rescue inhaler frequency: rarely Limitation of activity: no Productive cough: no Last Spirometry: with pulmonary Pneumovax: Up to Date Influenza: Up to Date   ATRIAL FIBRILLATION Atrial fibrillation status: stable Satisfied with current treatment: yes  Medication side effects:  no Medication compliance: good compliance Etiology of atrial fibrillation: unknown Palpitations:  no Chest pain:  no Dyspnea on exertion:  no Orthopnea:  no Syncope:  no Edema:  no Ventricular rate control: B-blocker Anti-coagulation: long acting   CHRONIC KIDNEY DISEASE CKD status: stable Medications renally dose: yes Previous renal evaluation: no Pneumovax:  Up to Date Influenza Vaccine:  Up to Date   DEPRESSION Continues Effexor  150 MG daily + Buspar  5 MG BID.  Follows with local pain clinic, they adjust her pain pump.  Currently taking oral pain medication very rarely from ortho due to recent neck pain. Mood status: stable Satisfied with current treatment?: yes Symptom severity: moderate  Duration of current treatment : chronic Side effects: no Medication compliance: good compliance Psychotherapy/counseling: yes in the past Depressed mood: occasional Anxious mood:  occasional Anhedonia: no Significant weight loss or gain: no Insomnia: yes due hip and neck pain Fatigue: no Feelings of worthlessness or guilt: no Impaired concentration/indecisiveness: no Suicidal ideations: no Hopelessness: no Crying spells: occasional    12/18/2023   10:01 AM 12/15/2023   12:57 PM 11/17/2023    4:21 PM 11/17/2023    3:17 PM 10/08/2023   12:53 PM  Depression screen PHQ 2/9  Decreased Interest 0 0 0 0 0  Down, Depressed, Hopeless 2 0 1 1 1   PHQ - 2 Score 2 0 1 1 1   Altered sleeping 3  3    Tired, decreased energy 2  2    Change in appetite 0  2    Feeling bad or failure about yourself  0  0    Trouble concentrating 0  0    Moving slowly or fidgety/restless 0  0    Suicidal thoughts 0  0    PHQ-9 Score 7  8    Difficult doing work/chores Not difficult at all  Not difficult at all         12/18/2023   10:02 AM 11/17/2023    4:22 PM 11/17/2023    3:10 PM 09/16/2023    8:11 AM  GAD 7 : Generalized Anxiety Score  Nervous, Anxious, on Edge 1 1 1 1   Control/stop worrying 0 0 0 0  Worry too much - different things 0 0 0 1  Trouble relaxing 0 1 1 1   Restless 0 0 0 0  Easily annoyed or irritable 0 0 0 0  Afraid - awful might happen 0 0 0 0  Total GAD 7 Score 1 2 2 3   Anxiety Difficulty Not difficult at all Not difficult at all Not difficult at all Not difficult at all   Relevant past medical, surgical, family and social history reviewed and updated as indicated. Interim medical history since our last visit reviewed. Allergies and medications reviewed and updated.  Review of Systems  Constitutional:  Negative for activity change, appetite change, diaphoresis, fatigue and fever.  Respiratory:  Negative for cough, chest tightness and shortness of breath.   Cardiovascular:  Negative for chest pain, palpitations and leg swelling.  Gastrointestinal: Negative.   Endocrine: Negative for heat intolerance, polydipsia, polyphagia and polyuria.  Neurological: Negative.    Psychiatric/Behavioral: Negative.     Per HPI unless specifically indicated above     Objective:    BP 123/70   Pulse 83   Temp 97.6 F (36.4 C) (Oral)   Ht 4' 10.2" (1.478 m)   Wt 142 lb (64.4 kg)   LMP  (LMP Unknown)   SpO2 94%   BMI 29.47 kg/m   Wt Readings from Last 3 Encounters:  12/18/23 142 lb (64.4 kg)  12/16/23 142 lb (64.4 kg)  12/15/23 142 lb (64.4 kg)    Physical Exam Vitals and nursing note reviewed.  Constitutional:      General: She is awake. She is not in acute distress.    Appearance: Normal appearance. She is well-developed and well-groomed. She is not ill-appearing or toxic-appearing.  HENT:     Head: Normocephalic.     Right Ear: Hearing and external ear normal.  Left Ear: Hearing and external ear normal.  Eyes:     General: Lids are normal.        Right eye: No discharge.        Left eye: No discharge.     Conjunctiva/sclera: Conjunctivae normal.     Pupils: Pupils are equal, round, and reactive to light.  Neck:     Thyroid : No thyromegaly.     Vascular: No carotid bruit.     Comments: Kyphosis present back. Cardiovascular:     Rate and Rhythm: Normal rate and regular rhythm.     Heart sounds: Normal heart sounds. No murmur heard.    No gallop.  Pulmonary:     Effort: Pulmonary effort is normal. No accessory muscle usage or respiratory distress.     Breath sounds: Normal breath sounds. No decreased breath sounds, wheezing or rales.     Comments: Oxygen  by [place] 2 L. Abdominal:     General: Bowel sounds are normal. There is no distension.     Palpations: Abdomen is soft.     Tenderness: There is no abdominal tenderness.  Musculoskeletal:     Cervical back: Normal range of motion and neck supple. Torticollis present.     Right lower leg: No edema.     Left lower leg: No edema.  Lymphadenopathy:     Cervical: No cervical adenopathy.  Skin:    General: Skin is warm and dry.  Neurological:     Mental Status: She is alert and oriented to  person, place, and time.     Deep Tendon Reflexes: Reflexes are normal and symmetric.     Reflex Scores:      Brachioradialis reflexes are 2+ on the right side and 2+ on the left side.      Patellar reflexes are 2+ on the right side and 2+ on the left side. Psychiatric:        Attention and Perception: Attention normal.        Mood and Affect: Mood normal.        Speech: Speech normal.        Behavior: Behavior normal. Behavior is cooperative.        Thought Content: Thought content normal.    Results for orders placed or performed in visit on 11/26/23  Basic metabolic panel with GFR   Collection Time: 11/26/23  2:44 PM  Result Value Ref Range   Glucose 60 (L) 70 - 99 mg/dL   BUN 12 8 - 27 mg/dL   Creatinine, Ser 9.56 (H) 0.57 - 1.00 mg/dL   eGFR 54 (L) >21 HY/QMV/7.84   BUN/Creatinine Ratio 11 (L) 12 - 28   Sodium 145 (H) 134 - 144 mmol/L   Potassium 5.0 3.5 - 5.2 mmol/L   Chloride 101 96 - 106 mmol/L   CO2 27 20 - 29 mmol/L   Calcium  9.5 8.7 - 10.3 mg/dL      Assessment & Plan:   Problem List Items Addressed This Visit       Cardiovascular and Mediastinum   Chronic HFrEF (heart failure with reduced ejection fraction) (HCC) (Chronic)   Chronic, ongoing with most recent 08/11/23 was 25-30%, a decline from previous EF 45-50% (10/22/22), followed by cardiology and HF Clinic.  Euvolemic today.  Continue current medication regimen and collaboration with cardiology.  Recommend: - Reminded to call for an overnight weight gain of >2 pounds or a weekly weight gain of >5 pounds - not adding salt to food and read food  labels. Reviewed the importance of keeping daily sodium intake to 2000mg  daily - Avoid Ibuprofen products      Relevant Medications   apixaban  (ELIQUIS ) 5 MG TABS tablet   atorvastatin  (LIPITOR) 40 MG tablet   losartan  (COZAAR ) 25 MG tablet   Persistent atrial fibrillation (HCC)   Chronic, stable.  Rate well controlled.  Continue current medications and collaboration  with cardiology.      Relevant Medications   apixaban  (ELIQUIS ) 5 MG TABS tablet   atorvastatin  (LIPITOR) 40 MG tablet   losartan  (COZAAR ) 25 MG tablet   Nonischemic cardiomyopathy (HCC)   Chronic, ongoing in setting of PE's in past.  Continue current medication regimen and collaboration with cardiology.  Recent notes reviewed.      Relevant Medications   apixaban  (ELIQUIS ) 5 MG TABS tablet   atorvastatin  (LIPITOR) 40 MG tablet   losartan  (COZAAR ) 25 MG tablet   Hypertension associated with diabetes (HCC)   Chronic, stable.  BP well below goal in office today.  Recommend she continue to monitor BP at home regularly.  Focus on DASH diet.  Continue current medication regimen and adjust as needed, refills sent as needed.  LABS: CBC, CMP.  Urine ALB 80 February 2025.  Return in 3 months.       Relevant Medications   apixaban  (ELIQUIS ) 5 MG TABS tablet   atorvastatin  (LIPITOR) 40 MG tablet   losartan  (COZAAR ) 25 MG tablet   metFORMIN  (GLUCOPHAGE ) 1000 MG tablet   Dulaglutide  (TRULICITY ) 4.5 MG/0.5ML SOAJ   TRESIBA  FLEXTOUCH 100 UNIT/ML FlexTouch Pen   insulin  lispro (HUMALOG  KWIKPEN) 100 UNIT/ML KwikPen   Other Relevant Orders   Bayer DCA Hb A1c Waived   CBC with Differential/Platelet   TSH     Respiratory   COPD, moderate (HCC) (Chronic)   Chronic, stable. Rare use of Albuterol . Continue Trelegy which offers benefit of medication minimization and has benefited symptoms.  Continue to collaborate with Dr. Jamal Mays, recent notes reviewed.  Continue O2 use as ordered.      Relevant Medications   montelukast  (SINGULAIR ) 10 MG tablet   Sleep apnea   Chronic.  Poor tolerance of CPAP mask, uses O2 night 2 L [place].        Endocrine   Diabetic peripheral neuropathy (HCC) (Chronic)   Chronic, ongoing with last A1c 7.5% today, slight trend up due to recent surgery and steroids.  Urine ALB 80 February 2025. Continue current regimen and adjust as needed.  Did not tolerate Jardiance  in past +  Mounjaro  made her sick.  Is tolerating Farxiga .  She monitors sugar levels frequently throughout daytime hours. - Vaccines up to date - Foot exam up to date.  Eye exam up to date. - ARB and statin on board.      Relevant Medications   atorvastatin  (LIPITOR) 40 MG tablet   losartan  (COZAAR ) 25 MG tablet   metFORMIN  (GLUCOPHAGE ) 1000 MG tablet   venlafaxine  XR (EFFEXOR -XR) 150 MG 24 hr capsule   Dulaglutide  (TRULICITY ) 4.5 MG/0.5ML SOAJ   TRESIBA  FLEXTOUCH 100 UNIT/ML FlexTouch Pen   insulin  lispro (HUMALOG  KWIKPEN) 100 UNIT/ML KwikPen   gabapentin  (NEURONTIN ) 400 MG capsule   Other Relevant Orders   Bayer DCA Hb A1c Waived   Insulin  dependent type 2 diabetes mellitus (HCC) - Primary   Chronic, ongoing with last A1c 7.5% today, slight trend up due to recent surgery and steroids.  Urine ALB 80 February 2025. Continue current regimen and adjust as needed.  Did not tolerate Jardiance   in past + Mounjaro  made her sick.  Is tolerating Farxiga .  She monitors sugar levels frequently throughout daytime hours. - Vaccines up to date - Foot exam up to date.  Eye exam up to date. - ARB and statin on board. - Will continue Metformin  at max dosing, Trulicity  4.5 MG weekly, Farxiga , and Tresiba  55 units, increase 3 units in 3 days if fasting sugar not less then 130 consistently recommended.  Continue meal time insulin , 2-5 units prior to meals and will increase as needed.  Educated her on this.   - Freestyle use continues. - Recommend she continue to monitor BS consistently at home and document + focus heavily on diet changes.  She is aware to notify provider if fasting BS >130 consistently or <70, as may need to adjust insulin  further.          Relevant Medications   atorvastatin  (LIPITOR) 40 MG tablet   losartan  (COZAAR ) 25 MG tablet   metFORMIN  (GLUCOPHAGE ) 1000 MG tablet   Dulaglutide  (TRULICITY ) 4.5 MG/0.5ML SOAJ   TRESIBA  FLEXTOUCH 100 UNIT/ML FlexTouch Pen   insulin  lispro (HUMALOG  KWIKPEN)  100 UNIT/ML KwikPen   Other Relevant Orders   Bayer DCA Hb A1c Waived   Hyperlipidemia associated with type 2 diabetes mellitus (HCC)   Chronic, stable.  Continue current medication regimen and adjust as needed.  Lipid panel today.      Relevant Medications   apixaban  (ELIQUIS ) 5 MG TABS tablet   atorvastatin  (LIPITOR) 40 MG tablet   losartan  (COZAAR ) 25 MG tablet   metFORMIN  (GLUCOPHAGE ) 1000 MG tablet   Dulaglutide  (TRULICITY ) 4.5 MG/0.5ML SOAJ   TRESIBA  FLEXTOUCH 100 UNIT/ML FlexTouch Pen   insulin  lispro (HUMALOG  KWIKPEN) 100 UNIT/ML KwikPen   Other Relevant Orders   Bayer DCA Hb A1c Waived   Comprehensive metabolic panel with GFR   Lipid Panel w/o Chol/HDL Ratio     Genitourinary   CKD stage 3a, GFR 45-59 ml/min (HCC) (Chronic)   Ongoing and stable recent labs.  Continue Losartan  at low dose for heart and kidney protection + Farxiga .  Labs CBC and CMP.  Will refer to nephrology if any worsening in future.  May need to reduce Metformin  if eGFR <45.        Other   Opiate dependence, continuous (HCC)   Chronic, followed by pain management, will continue this collaboration.  Recent notes reviewed.      Depression, major, single episode, in partial remission (HCC)   Chronic, ongoing.  Denies SI/HI.  Mood well-controlled at this time.  Continue current medication regimen and adjust as needed.        Relevant Medications   venlafaxine  XR (EFFEXOR -XR) 150 MG 24 hr capsule   B12 deficiency   Chronic, stable.  B12 level obtained today, continue injections monthly.      Relevant Orders   Vitamin B12      Follow up plan: Return in about 3 months (around 03/19/2024) for T2DM, HTN/HLD, COPD, Depression, HF, GERD.

## 2023-12-18 NOTE — Assessment & Plan Note (Signed)
 Chronic, followed by pain management, will continue this collaboration.  Recent notes reviewed.

## 2023-12-18 NOTE — Assessment & Plan Note (Signed)
 Chronic, ongoing with last A1c 7.5% today, slight trend up due to recent surgery and steroids.  Urine ALB 80 February 2025. Continue current regimen and adjust as needed.  Did not tolerate Jardiance  in past + Mounjaro  made her sick.  Is tolerating Farxiga .  She monitors sugar levels frequently throughout daytime hours. - Vaccines up to date - Foot exam up to date.  Eye exam up to date. - ARB and statin on board.

## 2023-12-19 ENCOUNTER — Ambulatory Visit: Payer: Self-pay | Admitting: Nurse Practitioner

## 2023-12-19 LAB — CBC WITH DIFFERENTIAL/PLATELET
Basophils Absolute: 0.1 10*3/uL (ref 0.0–0.2)
Basos: 1 %
EOS (ABSOLUTE): 0.3 10*3/uL (ref 0.0–0.4)
Eos: 3 %
Hematocrit: 38.9 % (ref 34.0–46.6)
Hemoglobin: 12 g/dL (ref 11.1–15.9)
Immature Grans (Abs): 0 10*3/uL (ref 0.0–0.1)
Immature Granulocytes: 0 %
Lymphocytes Absolute: 2.4 10*3/uL (ref 0.7–3.1)
Lymphs: 25 %
MCH: 28.3 pg (ref 26.6–33.0)
MCHC: 30.8 g/dL — ABNORMAL LOW (ref 31.5–35.7)
MCV: 92 fL (ref 79–97)
Monocytes Absolute: 0.8 10*3/uL (ref 0.1–0.9)
Monocytes: 8 %
Neutrophils Absolute: 5.8 10*3/uL (ref 1.4–7.0)
Neutrophils: 63 %
Platelets: 429 10*3/uL (ref 150–450)
RBC: 4.24 x10E6/uL (ref 3.77–5.28)
RDW: 13 % (ref 11.7–15.4)
WBC: 9.3 10*3/uL (ref 3.4–10.8)

## 2023-12-19 LAB — COMPREHENSIVE METABOLIC PANEL WITH GFR
ALT: 9 IU/L (ref 0–32)
AST: 14 IU/L (ref 0–40)
Albumin: 4.1 g/dL (ref 3.8–4.8)
Alkaline Phosphatase: 92 IU/L (ref 44–121)
BUN/Creatinine Ratio: 14 (ref 12–28)
BUN: 15 mg/dL (ref 8–27)
Bilirubin Total: 0.2 mg/dL (ref 0.0–1.2)
CO2: 28 mmol/L (ref 20–29)
Calcium: 9.4 mg/dL (ref 8.7–10.3)
Chloride: 95 mmol/L — ABNORMAL LOW (ref 96–106)
Creatinine, Ser: 1.06 mg/dL — ABNORMAL HIGH (ref 0.57–1.00)
Globulin, Total: 3.1 g/dL (ref 1.5–4.5)
Glucose: 180 mg/dL — ABNORMAL HIGH (ref 70–99)
Potassium: 4.5 mmol/L (ref 3.5–5.2)
Sodium: 137 mmol/L (ref 134–144)
Total Protein: 7.2 g/dL (ref 6.0–8.5)
eGFR: 55 mL/min/{1.73_m2} — ABNORMAL LOW (ref >59)

## 2023-12-19 LAB — VITAMIN B12: Vitamin B-12: 907 pg/mL (ref 232–1245)

## 2023-12-19 LAB — LIPID PANEL W/O CHOL/HDL RATIO
Cholesterol, Total: 152 mg/dL (ref 100–199)
HDL: 73 mg/dL (ref >39)
LDL Chol Calc (NIH): 62 mg/dL (ref 0–99)
Triglycerides: 91 mg/dL (ref 0–149)
VLDL Cholesterol Cal: 17 mg/dL (ref 5–40)

## 2023-12-19 LAB — TSH: TSH: 2.01 u[IU]/mL (ref 0.450–4.500)

## 2023-12-19 NOTE — Progress Notes (Signed)
 Contacted via MyChart   Good morning Jordan Chan, your labs have returned with exception of thyroid  lab which is still pending.   - CBC shows anemia is improving.  Great news!! - Kidney function, creatinine and eGFR, continues to show stage 3a kidney disease with no worsening.  Liver function, AST and ALT, is normal. - Remainder of labs stable.  Any questions? Keep being stellar!!  Thank you for allowing me to participate in your care.  I appreciate you. Kindest regards, Teagon Kron

## 2023-12-29 ENCOUNTER — Ambulatory Visit: Payer: Self-pay

## 2023-12-29 ENCOUNTER — Telehealth: Payer: Self-pay

## 2023-12-29 VITALS — Ht 58.5 in | Wt 142.0 lb

## 2023-12-29 DIAGNOSIS — Z0000 Encounter for general adult medical examination without abnormal findings: Secondary | ICD-10-CM | POA: Diagnosis not present

## 2023-12-29 NOTE — Patient Instructions (Addendum)
 [name] , Thank you for taking time out of your busy schedule to complete your Annual Wellness Visit with me. I enjoyed our conversation and look forward to speaking with you again next year. I, as well as your care team,  appreciate your ongoing commitment to your health goals. Please review the following plan we discussed and let me know if I can assist you in the future. Your Game plan/ To Do List    Referrals: None  Follow up Visits: Next Medicare AWV with our clinical staff: 01/03/25 @ 1:10pm (phone visit)   Have you seen your provider in the last 6 months (3 months if uncontrolled diabetes)? Yes Next Office Visit with your provider: 03/21/24 @ 9:20am with Jordan [name], NP  Clinician Recommendations:  You will be due for a diabetic foot exam at your next OV. Aim for 30 minutes of exercise or brisk walking, 6-8 glasses of water , and 5 servings of fruits and vegetables each day.       This is a list of the screening recommended for you and due dates:  Health Maintenance  Topic Date Due   COVID-19 Vaccine (4 - 2024-25 season) 12/29/2023*   Colon Cancer Screening  12/17/2024*   Complete foot exam   01/14/2024   Flu Shot  03/04/2024   Mammogram  06/02/2024   Hemoglobin A1C  06/19/2024   Yearly kidney health urinalysis for diabetes  09/15/2024   Eye exam for diabetics  10/04/2024   Yearly kidney function blood test for diabetes  12/17/2024   Medicare Annual Wellness Visit  12/28/2024   DTaP/Tdap/Td vaccine (5 - Td or Tdap) 09/25/2032   DEXA scan (bone density measurement)  06/02/2033   Pneumonia Vaccine  Completed   Hepatitis C Screening  Completed   Zoster (Shingles) Vaccine  Completed   HPV Vaccine  Aged Out   Meningitis B Vaccine  Aged Out  *Topic was postponed. The date shown is not the original due date.    Advanced directives: (Copy Requested) Please bring a copy of your health care power of attorney and living will to the office to be added to your chart at your  convenience. You can mail to [facility] [address] [place], [place] [address] or email to [email or web address] Advance Care Planning is important because it:  [x]  Makes sure you receive the medical care that is consistent with your values, goals, and preferences  [x]  It provides guidance to your family and loved ones and reduces their decisional burden about whether or not they are making the right decisions based on your wishes.  Follow the link provided in your after visit summary or read over the paperwork we have mailed to you to help you started getting your Advance Directives in place. If you need assistance in completing these, please reach out to us  so that we can help you!  See attachments for Preventive Care and Fall Prevention Tips.   Fall Prevention in the Home, Adult Falls can cause injuries and affect people of all ages. There are many simple things that you can do to make your home safe and to help prevent falls. If you need it, ask for help making these changes. What actions can I take to prevent falls? General information Use good lighting in all rooms. Make sure to: Replace any light bulbs that burn out. Turn on lights if it is dark and use night-lights. Keep items that you use often in easy-to-reach places. Lower the shelves  around your home if needed. Move furniture so that there are clear paths around it. Do not keep throw rugs or other things on the floor that can make you trip. If any of your floors are uneven, fix them. Add color or contrast paint or tape to clearly mark and help you see: Grab bars or handrails. First and last steps of staircases. Where the edge of each step is. If you use a ladder or stepladder: Make sure that it is fully opened. Do not climb a closed ladder. Make sure the sides of the ladder are locked in place. Have someone hold the ladder while you use it. Know where your pets are as you move through your  home. What can I do in the bathroom?     Keep the floor dry. Clean up any water  that is on the floor right away. Remove soap buildup in the bathtub or shower. Buildup makes bathtubs and showers slippery. Use non-skid mats or decals on the floor of the bathtub or shower. Attach bath mats securely with double-sided, non-slip rug tape. If you need to sit down while you are in the shower, use a non-slip stool. Install grab bars by the toilet and in the bathtub and shower. Do not use towel bars as grab bars. What can I do in the bedroom? Make sure that you have a light by your bed that is easy to reach. Do not use any sheets or blankets on your bed that hang to the floor. Have a firm bench or chair with side arms that you can use for support when you get dressed. What can I do in the kitchen? Clean up any spills right away. If you need to reach something above you, use a sturdy step stool that has a grab bar. Keep electrical cables out of the way. Do not use floor polish or wax that makes floors slippery. What can I do with my stairs? Do not leave anything on the stairs. Make sure that you have a light switch at the top and the bottom of the stairs. Have them installed if you do not have them. Make sure that there are handrails on both sides of the stairs. Fix handrails that are broken or loose. Make sure that handrails are as long as the staircases. Install non-slip stair treads on all stairs in your home if they do not have carpet. Avoid having throw rugs at the top or bottom of stairs, or secure the rugs with carpet tape to prevent them from moving. Choose a carpet design that does not hide the edge of steps on the stairs. Make sure that carpet is firmly attached to the stairs. Fix any carpet that is loose or worn. What can I do on the outside of my home? Use bright outdoor lighting. Repair the edges of walkways and driveways and fix any cracks. Clear paths of anything that can make you  trip, such as tools or rocks. Add color or contrast paint or tape to clearly mark and help you see high doorway thresholds. Trim any bushes or trees on the main path into your home. Check that handrails are securely fastened and in good repair. Both sides of all steps should have handrails. Install guardrails along the edges of any raised decks or porches. Have leaves, snow, and ice cleared regularly. Use sand, salt, or ice melt on walkways during winter months if you live where there is ice and snow. In the garage, clean up any spills right  away, including grease or oil spills. What other actions can I take? Review your medicines with your health care provider. Some medicines can make you confused or feel dizzy. This can increase your chance of falling. Wear closed-toe shoes that fit well and support your feet. Wear shoes that have rubber soles and low heels. Use a cane, walker, scooter, or crutches that help you move around if needed. Talk with your provider about other ways that you can decrease your risk of falls. This may include seeing a physical therapist to learn to do exercises to improve movement and strength. Where to find more information Centers for Disease Control and Prevention, [employer]: TonerPromos.no [employer] on Aging: BaseRingTones.pl National Institute on Aging: BaseRingTones.pl Contact a health care provider if: You are afraid of falling at home. You feel weak, drowsy, or dizzy at home. You fall at home. Get help right away if you: Lose consciousness or have trouble moving after a fall. Have a fall that causes a head injury. These symptoms may be an emergency. Get help right away. Call 911. Do not wait to see if the symptoms will go away. Do not drive yourself to the hospital. This information is not intended to replace advice given to you by your health care provider. Make sure you discuss any questions you have with your health care provider. Document Revised: 03/24/2022  Document Reviewed: 03/24/2022 Elsevier Patient Education  2024 Elsevier Inc.  Managing Pain Without Opioids Opioids are strong medicines used to treat moderate to severe pain. For some people, especially those who have long-term (chronic) pain, opioids may not be the best choice for pain management due to: Side effects like nausea, constipation, and sleepiness. The risk of addiction (opioid use disorder). The longer you take opioids, the greater your risk of addiction. Pain that lasts for more than 3 months is called chronic pain. Managing chronic pain usually requires more than one approach and is often provided by a team of health care providers working together (multidisciplinary approach). Pain management may be done at a pain management center or pain clinic. How to manage pain without the use of opioids Use non-opioid medicines Non-opioid medicines for pain may include: Over-the-counter or prescription non-steroidal anti-inflammatory drugs (NSAIDs). These may be the first medicines used for pain. They work well for muscle and bone pain, and they reduce swelling. Acetaminophen . This over-the-counter medicine may work well for milder pain but not swelling. Antidepressants. These may be used to treat chronic pain. A certain type of antidepressant (tricyclics) is often used. These medicines are given in lower doses for pain than when used for depression. Anticonvulsants. These are usually used to treat seizures but may also reduce nerve (neuropathic) pain. Muscle relaxants. These relieve pain caused by sudden muscle tightening (spasms). You may also use a pain medicine that is applied to the skin as a patch, cream, or gel (topical analgesic), such as a numbing medicine. These may cause fewer side effects than medicines taken by mouth. Do certain therapies as directed Some therapies can help with pain management. They include: Physical therapy. You will do exercises to gain strength and  flexibility. A physical therapist may teach you exercises to move and stretch parts of your body that are weak, stiff, or painful. You can learn these exercises at physical therapy visits and practice them at home. Physical therapy may also involve: Massage. Heat wraps or applying heat or cold to affected areas. Electrical signals that interrupt pain signals (transcutaneous electrical nerve stimulation, TENS). Weak  lasers that reduce pain and swelling (low-level laser therapy). Signals from your body that help you learn to regulate pain (biofeedback). Occupational therapy. This helps you to learn ways to function at home and work with less pain. Recreational therapy. This involves trying new activities or hobbies, such as a physical activity or drawing. Mental health therapy, including: Cognitive behavioral therapy (CBT). This helps you learn coping skills for dealing with pain. Acceptance and commitment therapy (ACT) to change the way you think and react to pain. Relaxation therapies, including muscle relaxation exercises and mindfulness-based stress reduction. Pain management counseling. This may be individual, family, or group counseling.  Receive medical treatments Medical treatments for pain management include: Nerve block injections. These may include a pain blocker and anti-inflammatory medicines. You may have injections: Near the spine to relieve chronic back or neck pain. Into joints to relieve back or joint pain. Into nerve areas that supply a painful area to relieve body pain. Into muscles (trigger point injections) to relieve some painful muscle conditions. A medical device placed near your spine to help block pain signals and relieve nerve pain or chronic back pain (spinal cord stimulation device). Acupuncture. Follow these instructions at home Medicines Take over-the-counter and prescription medicines only as told by your health care provider. If you are taking pain medicine,  ask your health care providers about possible side effects to watch out for. Do not drive or use heavy machinery while taking prescription opioid pain medicine. Lifestyle  Do not use drugs or alcohol  to reduce pain. If you drink alcohol , limit how much you have to: 0-1 drink a day for women who are not pregnant. 0-2 drinks a day for men. Know how much alcohol  is in a drink. In the [place] one drink equals one 12 oz bottle of beer (355 mL), one 5 oz glass of wine (148 mL), or one 1 oz glass of hard liquor (44 mL). Do not use any products that contain nicotine or tobacco. These products include cigarettes, chewing tobacco, and vaping devices, such as e-cigarettes. If you need help quitting, ask your health care provider. Eat a healthy diet and maintain a healthy weight. Poor diet and excess weight may make pain worse. Eat foods that are high in fiber. These include fresh fruits and vegetables, whole grains, and beans. Limit foods that are high in fat and processed sugars, such as fried and sweet foods. Exercise regularly. Exercise lowers stress and may help relieve pain. Ask your health care provider what activities and exercises are safe for you. If your health care provider approves, join an exercise class that combines movement and stress reduction. Examples include yoga and tai chi. Get enough sleep. Lack of sleep may make pain worse. Lower stress as much as possible. Practice stress reduction techniques as told by your therapist. General instructions Work with all your pain management providers to find the treatments that work best for you. You are an important member of your pain management team. There are many things you can do to reduce pain on your own. Consider joining an online or in-person support group for people who have chronic pain. Keep all follow-up visits. This is important. Where to find more information You can find more information about managing pain without opioids  from: American Academy of Pain Medicine: painmed.org Institute for Chronic Pain: instituteforchronicpain.org American Chronic Pain Association: theacpa.org Contact a health care provider if: You have side effects from pain medicine. Your pain gets worse or does not get better with  treatments or home therapy. You are struggling with anxiety or depression. Summary Many types of pain can be managed without opioids. Chronic pain may respond better to pain management without opioids. Pain is best managed when you and a team of health care providers work together. Pain management without opioids may include non-opioid medicines, medical treatments, physical therapy, mental health therapy, and lifestyle changes. Tell your health care providers if your pain gets worse or is not being managed well enough. This information is not intended to replace advice given to you by your health care provider. Make sure you discuss any questions you have with your health care provider. Document Revised: 10/31/2020 Document Reviewed: 10/31/2020 Elsevier Patient Education  2024 [employer].

## 2023-12-29 NOTE — Telephone Encounter (Signed)
 I have authorization for her pump revision with Dr. Naveira. Valid 01/14/24 - 03/03/24. Please let me know a date. Thanks.

## 2023-12-29 NOTE — Progress Notes (Signed)
 Subjective:   Jordan Chan is a 73 y.o. who presents for a Medicare Wellness preventive visit.  As a reminder, Annual Wellness Visits don't include a physical exam, and some assessments may be limited, especially if this visit is performed virtually. We may recommend an in-person follow-up visit with your provider if needed.  Visit Complete: Virtual I connected with  Jordan Chan on 12/29/23 by a audio enabled telemedicine application and verified that I am speaking with the correct person using two identifiers.  Patient Location: Home  Provider Location: Office/Clinic  I discussed the limitations of evaluation and management by telemedicine. The patient expressed understanding and agreed to proceed.  Vital Signs: Because this visit was a virtual/telehealth visit, some criteria may be missing or patient reported. Any vitals not documented were not able to be obtained and vitals that have been documented are patient reported.  VideoDeclined- This patient declined Librarian, academic. Therefore the visit was completed with audio only.  Persons Participating in Visit: Patient.  AWV Questionnaire: No: Patient Medicare AWV questionnaire was not completed prior to this visit.  Cardiac Risk Factors include: advanced age (>63men, >42 women);hypertension;diabetes mellitus;dyslipidemia;Other (see comment), Risk factor comments: OSA (no cpap)     Objective:     Today's Vitals   12/29/23 1306  Weight: 142 lb (64.4 kg)  Height: 4' 10.5" (1.486 m)  PainSc: 5    Body mass index is 29.17 kg/m.     12/29/2023    1:24 PM 12/15/2023   12:57 PM 11/17/2023   10:35 PM 10/08/2023   12:53 PM 08/09/2023    4:00 AM 08/04/2023   12:53 PM 08/04/2023   12:52 PM  Advanced Directives  Does Patient Have a Medical Advance Directive? Yes Yes Yes Yes Unable to assess, patient is non-responsive or altered mental status Yes   Type of Advance Directive Healthcare Power of  Jordan Chan;Living will Healthcare Power of Jordan Chan;Living will Healthcare Power of Jordan Chan;Living will Healthcare Power of Jordan Chan;Living will  Healthcare Power of Jordan Chan;Living will Healthcare Power of Jordan Chan;Living will  Does patient want to make changes to medical advance directive? No - Patient declined  No - Patient declined      Copy of Healthcare Power of Attorney in Chart? No - copy requested   No - copy requested       Current Medications (verified) Outpatient Encounter Medications as of 12/29/2023  Medication Sig   ACCU-CHEK AVIVA PLUS test strip TEST THREE TIMES DAILY   albuterol  (PROVENTIL ) (2.5 MG/3ML) 0.083% nebulizer solution Take 3 mLs (2.5 mg total) by nebulization every 4 (four) hours as needed for wheezing or shortness of breath.   albuterol  (VENTOLIN  HFA) 108 (90 Base) MCG/ACT inhaler Inhale 2 puffs into the lungs every 6 (six) hours as needed for wheezing or shortness of breath.   alum & mag hydroxide-simeth (MAALOX/MYLANTA) 200-200-20 MG/5ML suspension Take 30 mLs by mouth every 6 (six) hours as needed for indigestion or heartburn.   apixaban  (ELIQUIS ) 5 MG TABS tablet Take 1 tablet (5 mg total) by mouth 2 (two) times daily.   atorvastatin  (LIPITOR) 40 MG tablet Take 1 tablet (40 mg total) by mouth daily.   busPIRone  (BUSPAR ) 5 MG tablet Take 1 tablet (5 mg total) by mouth 2 (two) times daily.   cholecalciferol  (VITAMIN D3) 25 MCG (1000 UNIT) tablet Take 1,000 Units by mouth daily.   Continuous Glucose Receiver (FREESTYLE LIBRE 2 READER) DEVI To check blood sugars 3-4 times daily due to insulin  use.  DX E11.40   Continuous Glucose Sensor (FREESTYLE LIBRE 2 SENSOR) MISC USE 1 SENSOR TO CHECK BLOOD SUGARS 3-4 TIMES DAILY DUE TO INSULIN  USE   Cyanocobalamin  1000 MCG/ML KIT Inject 1,000 mcg as directed every 30 (thirty) days. Next injection 3/21   cyclobenzaprine  (FLEXERIL ) 5 MG tablet Take 5 mg by mouth daily as needed for muscle spasms.   dapagliflozin  propanediol (FARXIGA )  10 MG TABS tablet Take 1 tablet (10 mg total) by mouth daily before breakfast.   docusate sodium  (COLACE) 100 MG capsule Take 1 capsule (100 mg total) by mouth 2 (two) times daily as needed for mild constipation.   Dulaglutide  (TRULICITY ) 4.5 MG/0.5ML SOAJ Inject 4.5 mg into the skin once a week.   folic acid  (FOLVITE ) 1 MG tablet TAKE 1 TABLET EVERY DAY   furosemide  (LASIX ) 40 MG tablet Take 0.5 tablets (20 mg total) by mouth daily.   gabapentin  (NEURONTIN ) 400 MG capsule Take 1 capsule (400 mg total) by mouth 3 (three) times daily.   HYDROcodone -acetaminophen  (NORCO/VICODIN) 5-325 MG tablet Take 1 tablet by mouth every 4 (four) hours as needed.   insulin  lispro (HUMALOG  KWIKPEN) 100 UNIT/ML KwikPen Inject 5 Units into the skin 3 (three) times daily before meals. Do not inject insulin  if you are not going to eat meal.  Only take before a meal.   linaclotide (LINZESS) 145 MCG CAPS capsule Take 145 mcg by mouth daily before breakfast.   losartan  (COZAAR ) 25 MG tablet Take 0.5 tablets (12.5 mg total) by mouth daily.   metFORMIN  (GLUCOPHAGE ) 1000 MG tablet Take 1 tablet (1,000 mg total) by mouth 2 (two) times daily with a meal.   metoprolol  succinate (TOPROL -XL) 25 MG 24 hr tablet Take 1 tablet (25 mg total) by mouth daily. Take with or immediately following a meal.   montelukast  (SINGULAIR ) 10 MG tablet Take 1 tablet (10 mg total) by mouth at bedtime.   naloxone  (NARCAN ) nasal spray 4 mg/0.1 mL Place 1 spray into the nose as needed for up to 365 doses (for opioid-induced respiratory depresssion). In case of emergency (overdose), spray once into each nostril. If no response within 3 minutes, repeat application and call 911.   Nebulizers (EASY AIR COMPRESSOR NEBULIZER) MISC Use to inhaler nebulizer treatments at needed per instructions on nebulizer prescription   ondansetron  (ZOFRAN ) 4 MG tablet Take 1 tablet (4 mg total) by mouth daily as needed for nausea or vomiting.   OXYGEN  Inhale 2 L into the lungs  at bedtime.   PAIN MANAGEMENT INTRATHECAL, IT, PUMP 1 each by Intrathecal route. Intrathecal (IT) medication:  Morphine  5.0 mg/ml, Bupivacaine 20.0 mg/ml,  Clonidine  100.0 mcg/ml Patient does not remember current. Adjusted every 3 months at [facility]   pantoprazole  (PROTONIX ) 40 MG tablet Take 1 tablet (40 mg total) by mouth daily.   polyethylene glycol (MIRALAX  / GLYCOLAX ) 17 g packet Take 17 g by mouth daily as needed for moderate constipation.   TRELEGY ELLIPTA  100-62.5-25 MCG/ACT AEPB INHALE 1 PUFF INTO THE LUNGS DAILY . 42 DAY EXPIRATION AFTER OPENING FOIL TRAY   TRESIBA  FLEXTOUCH 100 UNIT/ML FlexTouch Pen Inject 55 Units into the skin at bedtime.   venlafaxine  XR (EFFEXOR -XR) 150 MG 24 hr capsule Take 1 capsule (150 mg total) by mouth daily.   Accu-Chek Softclix Lancets lancets TEST BLOOD SUGAR THREE TIMES DAILY (Patient not taking: Reported on 12/29/2023)   Blood Glucose Monitoring Suppl (TRUE METRIX METER) w/Device KIT Use to check blood sugar 4 times a day (Patient not  taking: Reported on 12/29/2023)   feeding supplement (ENSURE ENLIVE / ENSURE PLUS) LIQD Take 237 mLs by mouth 3 (three) times daily between meals. (Patient not taking: Reported on 12/29/2023)   ferrous sulfate  325 (65 FE) MG EC tablet Take 1 tablet (325 mg total) by mouth 2 (two) times daily. (Patient not taking: Reported on 12/29/2023)   No facility-administered encounter medications on file as of 12/29/2023.    Allergies (verified) Other, Pain patch [menthol], Jardiance  [empagliflozin ], Prednisone , Avelox  [moxifloxacin  hcl in nacl], Doxycycline , Erythromycin , Fentanyl , Moxifloxacin  hcl, Oxycontin [oxycodone], and Ozempic [semaglutide]   History: Past Medical History:  Diagnosis Date   Arthritis    Asthma    Atrial fibrillation (HCC)    Breast cancer (HCC) 1998   right breast ca with mastectomy and chemotherapy and radiation   Bronchitis    CHF (congestive heart failure) (HCC)    "with Morphine  withdrawal"    COPD (chronic obstructive pulmonary disease) (HCC)    Diabetes mellitus without complication (HCC)    Diverticulitis    diverticulosis also   Dyspnea    Endometriosis    GERD (gastroesophageal reflux disease)    History of shingles 2000-2005   Hypercholesteremia    Hypertension    IBS (irritable bowel syndrome)    Low back pain    a. Implanted morphine /bupivicaine/clonidine  pump.   Neuropathy    Orthopnea    Oxygen  dependent    uses at night   Personal history of chemotherapy    Personal history of radiation therapy    Pneumonia    pneumonia 5-6 times, history of bronchitis also   Scoliosis    Sleep apnea    does not use cpap   Stroke (HCC) 2010   TIA, 10 years ago   Withdrawal from sedative drug (HCC)    withdrawal from morphine  when pump batteries died   Past Surgical History:  Procedure Laterality Date   ABDOMINAL HYSTERECTOMY  1987   BACK SURGERY     Tailbone removed following fracture   BREAST SURGERY Right    mastectomy   CARDIAC CATHETERIZATION     CATARACT EXTRACTION W/PHACO Right 05/19/2019   Procedure: CATARACT EXTRACTION PHACO AND INTRAOCULAR LENS PLACEMENT (IOC), RIGHT, DIABETIC;  Surgeon: Ola Berger, MD;  Location: [facility] ORS;  Service: Ophthalmology;  Laterality: Right;  Lot # [id number] H US : 00:43.8 CDE: 4.59   CATARACT EXTRACTION W/PHACO Left 06/16/2019   Procedure: CATARACT EXTRACTION PHACO AND INTRAOCULAR LENS PLACEMENT (IOC) LEFT VISION BLUE DIABETIC;  Surgeon: Ola Berger, MD;  Location: [facility] ORS;  Service: Ophthalmology;  Laterality: Left;  Lot #[id number] H US : 00:46.9 CDE: 6.53   COCCYX REMOVAL     COLONOSCOPY WITH PROPOFOL  N/A 01/01/2017   Procedure: COLONOSCOPY WITH PROPOFOL ;  Surgeon: Luke Salaam, MD;  Location[facility] ENDOSCOPY;  Service: Endoscopy;  Laterality: N/A;   COLONOSCOPY WITH PROPOFOL  N/A 12/11/2021   Procedure: COLONOSCOPY WITH PROPOFOL ;  Surgeon: Selena Daily, MD;  Location: [facility] ENDOSCOPY;  Service: Gastroenterology;   Laterality: N/A;   ELBOW ARTHROSCOPY WITH TENDON RECONSTRUCTION     ESOPHAGOGASTRODUODENOSCOPY (EGD) WITH PROPOFOL  N/A 01/01/2017   Procedure: ESOPHAGOGASTRODUODENOSCOPY (EGD) WITH PROPOFOL ;  Surgeon: Luke Salaam, MD;  Location: [facility] END[facility]COPY;  Service: Endoscopy;  Laterality: N/A;   ESOPHAGOGASTRODUODENOSCOPY (EGD) WITH PROPOFOL  N/A 12/11/2021   Procedure: ESOPHAGOGASTRODUODENOSCOPY (EGD) WITH PROPOFOL ;  Surgeon: Selena Daily, MD;  Location: [facility] ENDOSCOPY;  Service: Gastroenterology;  Laterality: N/A;   INTRATHECAL PUMP IMPLANT     LEFT HEART CATH AND CORONARY ANGIOGRAPHY N/A 04/28/2017   Procedure:  LEFT HEART CATH AND CORONARY ANGIOGRAPHY;  Surgeon: Sammy Crisp, MD;  Location: [facility] INVASIVE CV LAB;  Service: Cardiovascular;  Laterality: N/A;   MASTECTOMY Right 06/1997   morphine  pump  2011   PLANTAR FASCIA RELEASE     TOTAL HIP ARTHROPLASTY Left    TRIGGER FINGER RELEASE     Family History  Problem Relation Age of Onset   Cancer Mother 62       lung   Thyroid  disease Mother    Cancer Father 33       Pancreatic   Hypertension Sister    Cancer Sister        "cancer on face"   Hyperlipidemia Sister    Osteoporosis Maternal Grandmother    Cancer Paternal Grandmother        colon   Arthritis Paternal Grandfather    Hyperlipidemia Son    Seizures Son    Breast cancer Neg Hx    Social History   Socioeconomic History   Marital status: Legally Separated    Spouse name: Mont Antis   Number of children: 2   Years of education: Not on file   Highest education level: High school graduate  Occupational History   Occupation: retired  Tobacco Use   Smoking status: Former    Current packs/day: 0.00    Average packs/day: 1 pack/day for 30.0 years (30.0 ttl pk-yrs)    Types: Cigarettes    Start date: 04/30/1974    Quit date: 04/30/2004    Years since quitting: 19.6    Passive exposure: Past   Smokeless tobacco: Never  Vaping Use   Vaping status: Never Used  Substance and  Sexual Activity   Alcohol  use: No   Drug use: No   Sexual activity: Not Currently    Birth control/protection: Post-menopausal  Other Topics Concern   Not on file  Social History Narrative   ** Merged History Encounter **       Social Drivers of Health   Financial Resource Strain: Low Risk  (12/29/2023)   Overall Financial Resource Strain (CARDIA)    Difficulty of Paying Living Expenses: Not hard at all  Food Insecurity: No Food Insecurity (12/29/2023)   Hunger Vital Sign    Worried About Running Out of Food in the Last Year: Never true    Ran Out of Food in the Last Year: Never true  Transportation Needs: No Transportation Needs (12/29/2023)   PRAPARE - Administrator, Civil Service (Medical): No    Lack of Transportation (Non-Medical): No  Physical Activity: Inactive (12/29/2023)   Exercise Vital Sign    Days of Exercise per Week: 0 days    Minutes of Exercise per Session: 0 min  Stress: No Stress Concern Present (12/29/2023)   [employer] of Occupational Health - Occupational Stress Questionnaire    Feeling of Stress : Only a little  Social Connections: Moderately Isolated (12/29/2023)   Social Connection and Isolation Panel [NHANES]    Frequency of Communication with Friends and Family: More than three times a week    Frequency of Social Gatherings with Friends and Family: More than three times a week    Attends Religious Services: More than 4 times per year    Active Member of [employer] or Organizations: No    Attends Banker Meetings: Never    Marital Status: Separated    Tobacco Counseling Counseling given: No    Clinical Intake:  Pre-visit preparation completed: Yes  Pain : 0-10 Pain  Score: 5  Pain Type: Chronic pain Pain Location: Back Pain Descriptors / Indicators: Aching     BMI - recorded: 29.17 Nutritional Status: BMI 25 -29 Overweight Nutritional Risks: None Diabetes: Yes CBG done?: No (FBS 138 per patient) Did pt. bring  in CBG monitor from home?: No  Lab Results  Component Value Date   HGBA1C 7.5 (H) 12/18/2023   HGBA1C 6.4 (H) 09/16/2023   HGBA1C 7.4 (H) 06/12/2023     How often do you need to have someone help you when you read instructions, pamphlets, or other written materials from your doctor or pharmacy?: 1 - Never  Interpreter Needed?: No  Information entered by :: Jaunita Messier, CMA   Activities of Daily Living     12/29/2023    1:10 PM 08/09/2023    5:47 AM  In your present state of health, do you have any difficulty performing the following activities:  Hearing? 0 0  Comment  uta  Vision? 0 0  Comment  uta  Difficulty concentrating or making decisions? 0 0  Comment  uta  Walking or climbing stairs? 0   Dressing or bathing? 0   Doing errands, shopping? 0   Preparing Food and eating ? N   Using the Toilet? N   In the past six months, have you accidently leaked urine? N   Do you have problems with loss of bowel control? N   Managing your Medications? N   Managing your Finances? N   Housekeeping or managing your Housekeeping? N     Patient Care Team: [name], Jolene T, NP as PCP - General (Nurse Practitioner) End, Veryl Gottron, MD as PCP - Cardiology (Cardiology) Adelaida Holts, MD as Referring Physician (Pulmonary Disease) Adriana Albany, LCSW as Social Worker (Licensed Clinical Social Worker) Maile Score, Shella Devoid, [facility] (Inactive) (Pharmacist) Roxie Cord, RN as VBCI Care Management Roxie Cord, RN as Case Manager Clair Crews, MD as Referring Physician (Ophthalmology) Renaldo Caroli, MD as Referring Physician (Pain Medicine) Selena Daily, MD as Consulting Physician (Gastroenterology) Charlette Console, FNP (Cardiology)  Indicate any recent Medical Services you may have received from other than [facility] providers in the past year (date may be approximate).     Assessment:    This is a routine wellness examination for Silva.  Hearing/Vision  screen Hearing Screening - Comments:: Denies hearing loss Vision Screening - Comments:: Gets DM eye exams, Dr. Merrell Abate, [place] Gordon   Goals Addressed             This Visit's Progress    Patient Stated       "Breathe better"       Depression Screen     12/29/2023    1:20 PM 12/18/2023   10:01 AM 12/15/2023   12:57 PM 11/17/2023    4:21 PM 11/17/2023    3:17 PM 10/08/2023   12:53 PM 09/16/2023    8:11 AM  PHQ 2/9 Scores  PHQ - 2 Score 1 2 0 1 1 1 1   PHQ- 9 Score 5 7  8   8     Fall Risk     12/29/2023    1:25 PM 12/18/2023   10:01 AM 12/15/2023   12:57 PM 11/17/2023    4:21 PM 10/08/2023   12:53 PM  Fall Risk   Falls in the past year? 0 0 0 0 0  Number falls in past yr: 0 0  0 0  Injury with Fall? 0 0  0 0  Risk for  fall due to : No Fall Risks No Fall Risks  No Fall Risks   Follow up Falls evaluation completed Falls evaluation completed  Falls evaluation completed     MEDICARE RISK AT HOME:  Medicare Risk at Home Any stairs in or around the home?: Yes If so, are there any without handrails?: No Home free of loose throw rugs in walkways, pet beds, electrical cords, etc?: Yes Adequate lighting in your home to reduce risk of falls?: Yes Life alert?: No Use of a cane, walker or w/c?: No Grab bars in the bathroom?: Yes Shower chair or bench in shower?: Yes Elevated toilet seat or a handicapped toilet?: Yes  TIMED UP AND GO:  Was the test performed?  No  Cognitive Function: 6CIT completed        12/29/2023    1:26 PM 12/23/2022   10:03 AM 12/16/2021    9:05 AM 12/14/2020    9:26 AM 12/06/2018    9:39 AM  6CIT Screen  What Year? 0 points 0 points 0 points 0 points 0 points  What month? 0 points 0 points 0 points 0 points 0 points  What time? 0 points 0 points 0 points 0 points 0 points  Count back from 20 0 points 0 points 0 points 0 points 0 points  Months in reverse 0 points 0 points 0 points 0 points 0 points  Repeat phrase 0 points 0 points 0 points 0 points 0  points  Total Score 0 points 0 points 0 points 0 points 0 points    Immunizations Immunization History  Administered Date(s) Administered   Fluad Quad(high Dose 65+) 04/19/2019, 07/20/2019, 07/26/2020, 04/29/2021, 05/20/2022   Fluad Trivalent(High Dose 65+) 05/05/2017, 04/22/2018, 06/12/2023   Fluzone Influenza virus vaccine,trivalent (IIV3), split virus 07/04/2011, 04/20/2012   Hep A / Hep B 12/19/2022, 02/14/2023   Influenza, High Dose Seasonal PF 04/28/2016, 04/13/2017, 04/14/2018   Influenza,inj,Quad PF,6+ Mos 06/18/2015   Influenza-Unspecified 04/04/2014   PFIZER(Purple Top)SARS-COV-2 Vaccination 10/20/2019, 11/10/2019, 05/31/2020   Pneumococcal Conjugate-13 06/18/2015, 08/22/2015   Pneumococcal Polysaccharide-23 08/06/2005, 04/28/2011, 08/05/2016, 05/05/2017   Respiratory Syncytial Virus Vaccine,Recomb Aduvanted(Arexvy) 09/25/2022   Td 08/28/2007, 01/17/2019   Tdap 02/18/2007, 09/25/2022   Zoster Recombinant(Shingrix ) 04/29/2021, 07/23/2021    Screening Tests Health Maintenance  Topic Date Due   COVID-19 Vaccine (4 - 2024-25 season) 12/29/2023 (Originally 04/05/2023)   Colonoscopy  12/17/2024 (Originally 12/12/2022)   FOOT EXAM  01/14/2024   INFLUENZA VACCINE  03/04/2024   MAMMOGRAM  06/02/2024   HEMOGLOBIN A1C  06/19/2024   Diabetic kidney evaluation - Urine ACR  09/15/2024   OPHTHALMOLOGY EXAM  10/04/2024   Diabetic kidney evaluation - eGFR measurement  12/17/2024   Medicare Annual Wellness (AWV)  12/28/2024   DTaP/Tdap/Td (5 - Td or Tdap) 09/25/2032   DEXA SCAN  06/02/2033   Pneumonia Vaccine 22+ Years old  Completed   Hepatitis C Screening  Completed   Zoster Vaccines- Shingrix   Completed   HPV VACCINES  Aged Out   Meningococcal B Vaccine  Aged Out    Health Maintenance  There are no preventive care reminders to display for this patient.  Health Maintenance Items Addressed: See Nurse Notes  Additional Screening:  Vision Screening: Recommended annual  ophthalmology exams for early detection of glaucoma and other disorders of the eye.  Dental Screening: Recommended annual dental exams for proper oral hygiene  Community Resource Referral / Chronic Care Management: CRR required this visit?  No   CCM required this visit?  No  Plan:    I have personally reviewed and noted the following in the patient's chart:   Medical and social history Use of alcohol , tobacco or illicit drugs  Current medications and supplements including opioid prescriptions. Patient is currently taking opioid prescriptions. Information provided to patient regarding non-opioid alternatives. Patient advised to discuss non-opioid treatment plan with their provider. Functional ability and status Nutritional status Physical activity Advanced directives List of other physicians Hospitalizations, surgeries, and ER visits in previous 12 months Vitals Screenings to include cognitive, depression, and falls Referrals and appointments  In addition, I have reviewed and discussed with patient certain preventive protocols, quality metrics, and best practice recommendations. A written personalized care plan for preventive services as well as general preventive health recommendations were provided to patient.   Jaunita Messier, CMA   12/29/2023   After Visit Summary: (MyChart) Due to this being a telephonic visit, the after visit summary with patients personalized plan was offered to patient via MyChart   Notes: Please refer to Routing Comments.

## 2023-12-30 ENCOUNTER — Telehealth: Payer: Self-pay | Admitting: Family

## 2023-12-30 NOTE — Progress Notes (Unsigned)
 [facility] Note     PCP: Lemar Pyles, NP (telemedicine visit 05/25) Cardiologist: Sammy Crisp, MD (last seen 11/24)  Chief Complaint: fatigue   HPI:  Jordan Chan is a 73 y/o female with a history of B12 deficiency, chronic pain syndrome s/p intrathecal pump, paroxysmal atrial fibrillation, stress-induced cardiomyopathy in the setting of pain pump malfunction and opioid withdrawal (2018), HTN, hyperlipidemia, PE (2021), DM, COPD, torticollis, GERD, OSA, breast cancer, chronic back pain,anemia and IBS.   Echo 12/11/20: EF 45% with Grade II DD Echo 09/11/21: EF 25-30% with Grade II DD Echo 10/31/21: EF 40-45% with Grade I DD  Admitted 04/15/23 due to worsening of left hip pain at the surgical site associated with increasing redness about the left hip surgical wound. Tachycardic. WBC 17.6. CXR normal.Thought to be septic due to left hip surgical wound infection. Treated with IV fluids, analgesics and empiric IV antibiotics. Transferred to [facility].    Admitted 08/09/23 due to acute respiratory distress found with sats in the 50 to 60% on RA. Found to have multiple PE's and sepsis due to aspiration pneumonia. Initially intubated. Completed antibiotics & successfully extubated. Underwent swallowing evaluation. Heparin  stopped and transitioned to eliquis . Anemia stable. Echo 08/11/23: EF 25-30% with Grade I DD  She presents today for a HF follow-up visit with fatigue. Has associated shortness of breath. Has chronic neck pain from torticollis and chronic difficulty sleeping. Denies chest pain, palpitations, cough, abdominal distention, pedal edema or dizziness.   Wearing oxygen  at 2L around the clock. Quit smoking 19 years ago (~2006) when a grandchild was going to be born. Denies alcohol  use. Not adding salt to her food. Weighing daily   Previous cardiac studies:  [facility] 04/28/17:  No angiographically significant coronary artery disease. Severely  reduced LV contraction (LVEF 25-30%) with mid and apical akinesis, consistent with Takotsubo (stress-induced) cardiomyopathy. Mildly elevated left ventricular filling pressure.  ROS: All systems negative except what is listed in HPI, PMH and Problem List  Past Medical History:  Diagnosis Date   Arthritis    Asthma    Atrial fibrillation (HCC)    Breast cancer (HCC) 1998   right breast ca with mastectomy and chemotherapy and radiation   Bronchitis    CHF (congestive heart failure) (HCC)    "with Morphine  withdrawal"   COPD (chronic obstructive pulmonary disease) (HCC)    Diabetes mellitus without complication (HCC)    Diverticulitis    diverticulosis also   Dyspnea    Endometriosis    GERD (gastroesophageal reflux disease)    History of shingles 2000-2005   Hypercholesteremia    Hypertension    IBS (irritable bowel syndrome)    Low back pain    a. Implanted morphine /bupivicaine/clonidine  pump.   Neuropathy    Orthopnea    Oxygen  dependent    uses at night   Personal history of chemotherapy    Personal history of radiation therapy    Pneumonia    pneumonia 5-6 times, history of bronchitis also   Scoliosis    Sleep apnea    does not use cpap   Stroke (HCC) 2010   TIA, 10 years ago   Withdrawal from sedative drug (HCC)    withdrawal from morphine  when pump batteries died    Current Outpatient Medications  Medication Sig Dispense Refill   ACCU-CHEK AVIVA PLUS test strip TEST THREE TIMES DAILY 300 strip 3   Accu-Chek Softclix Lancets lancets TEST BLOOD SUGAR THREE TIMES DAILY (Patient not  taking: Reported on 12/29/2023) 300 each 3   albuterol  (PROVENTIL ) (2.5 MG/3ML) 0.083% nebulizer solution Take 3 mLs (2.5 mg total) by nebulization every 4 (four) hours as needed for wheezing or shortness of breath. 75 mL 1   albuterol  (VENTOLIN  HFA) 108 (90 Base) MCG/ACT inhaler Inhale 2 puffs into the lungs every 6 (six) hours as needed for wheezing or shortness of breath. 1 each 1    alum & mag hydroxide-simeth (MAALOX/MYLANTA) 200-200-20 MG/5ML suspension Take 30 mLs by mouth every 6 (six) hours as needed for indigestion or heartburn. 300 mL 1   apixaban  (ELIQUIS ) 5 MG TABS tablet Take 1 tablet (5 mg total) by mouth 2 (two) times daily. 180 tablet 3   atorvastatin  (LIPITOR) 40 MG tablet Take 1 tablet (40 mg total) by mouth daily. 90 tablet 3   Blood Glucose Monitoring Suppl (TRUE METRIX METER) w/Device KIT Use to check blood sugar 4 times a day (Patient not taking: Reported on 12/29/2023) 1 kit 4   busPIRone  (BUSPAR ) 5 MG tablet Take 1 tablet (5 mg total) by mouth 2 (two) times daily. 30 tablet 1   cholecalciferol  (VITAMIN D3) 25 MCG (1000 UNIT) tablet Take 1,000 Units by mouth daily.     Continuous Glucose Receiver (FREESTYLE LIBRE 2 READER) DEVI To check blood sugars 3-4 times daily due to insulin  use. DX E11.40 2 each 5   Continuous Glucose Sensor (FREESTYLE LIBRE 2 SENSOR) MISC USE 1 SENSOR TO CHECK BLOOD SUGARS 3-4 TIMES DAILY DUE TO INSULIN  USE 2 each 5   Cyanocobalamin  1000 MCG/ML KIT Inject 1,000 mcg as directed every 30 (thirty) days. Next injection 3/21     cyclobenzaprine  (FLEXERIL ) 5 MG tablet Take 5 mg by mouth daily as needed for muscle spasms.     dapagliflozin  propanediol (FARXIGA ) 10 MG TABS tablet Take 1 tablet (10 mg total) by mouth daily before breakfast. 90 tablet 3   docusate sodium  (COLACE) 100 MG capsule Take 1 capsule (100 mg total) by mouth 2 (two) times daily as needed for mild constipation. 30 capsule 0   Dulaglutide  (TRULICITY ) 4.5 MG/0.5ML SOAJ Inject 4.5 mg into the skin once a week. 12 mL 3   feeding supplement (ENSURE ENLIVE / ENSURE PLUS) LIQD Take 237 mLs by mouth 3 (three) times daily between meals. (Patient not taking: Reported on 12/29/2023) 237 mL 12   ferrous sulfate  325 (65 FE) MG EC tablet Take 1 tablet (325 mg total) by mouth 2 (two) times daily. (Patient not taking: Reported on 12/29/2023) 60 tablet 1   folic acid  (FOLVITE ) 1 MG tablet  TAKE 1 TABLET EVERY DAY 90 tablet 0   furosemide  (LASIX ) 40 MG tablet Take 0.5 tablets (20 mg total) by mouth daily. 30 tablet 1   gabapentin  (NEURONTIN ) 400 MG capsule Take 1 capsule (400 mg total) by mouth 3 (three) times daily. 90 capsule 12   HYDROcodone -acetaminophen  (NORCO/VICODIN) 5-325 MG tablet Take 1 tablet by mouth every 4 (four) hours as needed.     insulin  lispro (HUMALOG  KWIKPEN) 100 UNIT/ML KwikPen Inject 5 Units into the skin 3 (three) times daily before meals. Do not inject insulin  if you are not going to eat meal.  Only take before a meal. 15 mL 3   linaclotide (LINZESS) 145 MCG CAPS capsule Take 145 mcg by mouth daily before breakfast.     losartan  (COZAAR ) 25 MG tablet Take 0.5 tablets (12.5 mg total) by mouth daily. 45 tablet 3   metFORMIN  (GLUCOPHAGE ) 1000 MG tablet Take 1  tablet (1,000 mg total) by mouth 2 (two) times daily with a meal. 180 tablet 3   metoprolol  succinate (TOPROL -XL) 25 MG 24 hr tablet Take 1 tablet (25 mg total) by mouth daily. Take with or immediately following a meal. 30 tablet 11   montelukast  (SINGULAIR ) 10 MG tablet Take 1 tablet (10 mg total) by mouth at bedtime. 90 tablet 3   naloxone  (NARCAN ) nasal spray 4 mg/0.1 mL Place 1 spray into the nose as needed for up to 365 doses (for opioid-induced respiratory depresssion). In case of emergency (overdose), spray once into each nostril. If no response within 3 minutes, repeat application and call 911. 1 each 1   Nebulizers (EASY AIR COMPRESSOR NEBULIZER) MISC Use to inhaler nebulizer treatments at needed per instructions on nebulizer prescription 1 each 0   ondansetron  (ZOFRAN ) 4 MG tablet Take 1 tablet (4 mg total) by mouth daily as needed for nausea or vomiting. 30 tablet 1   OXYGEN  Inhale 2 L into the lungs at bedtime.     PAIN MANAGEMENT INTRATHECAL, IT, PUMP 1 each by Intrathecal route. Intrathecal (IT) medication:  Morphine  5.0 mg/ml, Bupivacaine 20.0 mg/ml,  Clonidine  100.0 mcg/ml Patient does not  remember current. Adjusted every 3 months at [facility]     pantoprazole  (PROTONIX ) 40 MG tablet Take 1 tablet (40 mg total) by mouth daily. 90 tablet 3   polyethylene glycol (MIRALAX  / GLYCOLAX ) 17 g packet Take 17 g by mouth daily as needed for moderate constipation. 10 packet 0   TRELEGY ELLIPTA  100-62.5-25 MCG/ACT AEPB INHALE 1 PUFF INTO THE LUNGS DAILY . 42 DAY EXPIRATION AFTER OPENING FOIL TRAY 180 each 3   TRESIBA  FLEXTOUCH 100 UNIT/ML FlexTouch Pen Inject 55 Units into the skin at bedtime. 30 mL 5   venlafaxine  XR (EFFEXOR -XR) 150 MG 24 hr capsule Take 1 capsule (150 mg total) by mouth daily. 90 capsule 3   No current facility-administered medications for this visit.    Allergies  Allergen Reactions   Other Palpitations    IV steroids   Pain Patch [Menthol] Anaphylaxis   Jardiance  [Empagliflozin ] Other (See Comments)    Yeast infection & dry mouth   Prednisone  Other (See Comments)    prednisone    Avelox  [Moxifloxacin  Hcl In Nacl] Other (See Comments)    Upset stomach   Doxycycline  Diarrhea   Erythromycin  Nausea Only and Other (See Comments)    Can take a Z-Pak just fine Other reaction(s): Other (See Comments) Can take Z-Pak  Can take a Z-Pak just fine   Fentanyl  Nausea Only and Rash   Moxifloxacin  Hcl Other (See Comments)    Upset stomach Upset stomach   Oxycontin [Oxycodone] Hives   Ozempic [Semaglutide] Nausea Only      Social History   Socioeconomic History   Marital status: Legally Separated    Spouse name: Mont Antis   Number of children: 2   Years of education: Not on file   Highest education level: High school graduate  Occupational History   Occupation: retired  Tobacco Use   Smoking status: Former    Current packs/day: 0.00    Average packs/day: 1 pack/day for 30.0 years (30.0 ttl pk-yrs)    Types: Cigarettes    Start date: 04/30/1974    Quit date: 04/30/2004    Years since quitting: 19.6    Passive exposure: Past   Smokeless tobacco: Never   Vaping Use   Vaping status: Never Used  Substance and Sexual Activity   Alcohol  use:  No   Drug use: No   Sexual activity: Not Currently    Birth control/protection: Post-menopausal  Other Topics Concern   Not on file  Social History Narrative   ** Merged History Encounter **       Social Drivers of Health   Financial Resource Strain: Low Risk  (12/29/2023)   Overall Financial Resource Strain (CARDIA)    Difficulty of Paying Living Expenses: Not hard at all  Food Insecurity: No Food Insecurity (12/29/2023)   Hunger Vital Sign    Worried About Running Out of Food in the Last Year: Never true    Ran Out of Food in the Last Year: Never true  Transportation Needs: No Transportation Needs (12/29/2023)   PRAPARE - Administrator, Civil Service (Medical): No    Lack of Transportation (Non-Medical): No  Physical Activity: Inactive (12/29/2023)   Exercise Vital Sign    Days of Exercise per Week: 0 days    Minutes of Exercise per Session: 0 min  Stress: No Stress Concern Present (12/29/2023)   [employer] of Occupational Health - Occupational Stress Questionnaire    Feeling of Stress : Only a little  Social Connections: Moderately Isolated (12/29/2023)   Social Connection and Isolation Panel [NHANES]    Frequency of Communication with Friends and Family: More than three times a week    Frequency of Social Gatherings with Friends and Family: More than three times a week    Attends Religious Services: More than 4 times per year    Active Member of Clubs or Organizations: No    Attends Banker Meetings: Never    Marital Status: Separated  Intimate Partner Violence: Not At Risk (12/29/2023)   Humiliation, Afraid, Rape, and Kick questionnaire    Fear of Current or Ex-Partner: No    Emotionally Abused: No    Physically Abused: No    Sexually Abused: No      Family History  Problem Relation Age of Onset   Cancer Mother 65       lung   Thyroid  disease  Mother    Cancer Father 56       Pancreatic   Hypertension Sister    Cancer Sister        "cancer on face"   Hyperlipidemia Sister    Osteoporosis Maternal Grandmother    Cancer Paternal Grandmother        colon   Arthritis Paternal Grandfather    Hyperlipidemia Son    Seizures Son    Breast cancer Neg Hx    Vitals:   12/31/23 1347  BP: (!) 122/59  Pulse: 96  SpO2: 97%  Weight: 142 lb 6.4 oz (64.6 kg)   Wt Readings from Last 3 Encounters:  12/31/23 142 lb 6.4 oz (64.6 kg)  12/29/23 142 lb (64.4 kg)  12/18/23 142 lb (64.4 kg)   Lab Results  Component Value Date   CREATININE 1.06 (H) 12/18/2023   CREATININE 1.08 (H) 11/26/2023   CREATININE 1.04 (H) 10/22/2023    PHYSICAL EXAM:  General: Well appearing. No resp difficulty HEENT: normal Neck: supple, no JVD, head is being pulled to the left due to torticollis Cor: Regular rhythm, rate. No rubs, gallops or murmurs Lungs: clear Abdomen: soft, nontender, nondistended. Extremities: no cyanosis, clubbing, rash, edema Neuro: alert & oriented X 3. Moves all 4 extremities w/o difficulty. Affect pleasant   ECG: not done   ASSESSMENT & PLAN:  1: NICM with reduced ejection fraction- - suspect  due to stress induced after pain pump malfunction & opioid withdrawal in 2018 - NYHA class II - euvolemic - weighing daily; reminded to call for overnight weight gain of > 2 pounds or weekly weight gain of > 5 pounds - weight stable from last visit here 1 month ago - Echo 12/11/20: EF 45% with Grade II DD - Echo 09/11/21: EF 25-30% with Grade II DD - Echo 10/31/21: EF 40-45% with Grade I DD - Echo 08/11/23: EF 25-30% with Grade I DD - continue farxiga  10mg  daily - continue furosemide  20mg  daily - continue losartan  12.5mg  daily; consider transitioning to entresto if BP maintains - continue metoprolol  succinate 25mg  daily - discussed adding spironolactone if BP maintains watching K+ closely as she's been both hypo/ hyperkalemic in the  past; will get lab results back first - if unable to use MRA due to K+, will plan to stop losartan  and begin entresto and patient is agreeable - not adding salt to her food - BNP 08/09/23 was 241.9  2: HTN- - BP 122/59 - saw PCP Alton Au) 05/25 - BMET 12/18/23 reviewed: sodium 137, potassium 4.5, creatinine 1.06 & GFR 55 - BMET today  3: DM- - A1c 06/22/23 was 7.4% - continue insulin  - continue metformin  1000mg  BID  4: COPD- - saw pulmonology Jamal Mays) 05/25 - wearing oxygen  at 2L around the clock but does take it off at times at home  5: Atrial fibrillation- - saw cardiology (End) 11/24 - continue apixaban  5mg  BID - continue metoprolol  to succinate 25mg  daily  6: Chronic pain- - follows with pain clinic - has chronic intrathecal pain pump; due to be replaced 07/25 - continue gabapentin  400mg  TID  7: Anemia- - Hg 12/18/23 reviewed and was 12.0 - saw GI Marcille Severance) 03/25 - continue ferrous sulfate  325mg  BID    Return in 2 weeks, sooner if needed.   Charlette Console, FNP 12/30/23

## 2023-12-30 NOTE — Telephone Encounter (Signed)
 Called to confirm/remind patient of their appointment at the [facility] on 12/31/23.   Appointment:   [x] Confirmed  [] Left mess   [] No answer/No voice mail  [] VM Full/unable to leave message  [] Phone not in service  Patient reminded to bring all medications and/or complete list.  Confirmed patient has transportation. Gave directions, instructed to utilize valet parking.

## 2023-12-31 ENCOUNTER — Encounter: Payer: Self-pay | Admitting: Family

## 2023-12-31 ENCOUNTER — Ambulatory Visit: Attending: Family | Admitting: Family

## 2023-12-31 ENCOUNTER — Other Ambulatory Visit: Payer: Self-pay

## 2023-12-31 VITALS — BP 122/59 | HR 96 | Wt 142.4 lb

## 2023-12-31 DIAGNOSIS — Z7901 Long term (current) use of anticoagulants: Secondary | ICD-10-CM | POA: Insufficient documentation

## 2023-12-31 DIAGNOSIS — Z853 Personal history of malignant neoplasm of breast: Secondary | ICD-10-CM | POA: Diagnosis not present

## 2023-12-31 DIAGNOSIS — G4733 Obstructive sleep apnea (adult) (pediatric): Secondary | ICD-10-CM | POA: Insufficient documentation

## 2023-12-31 DIAGNOSIS — M549 Dorsalgia, unspecified: Secondary | ICD-10-CM | POA: Diagnosis not present

## 2023-12-31 DIAGNOSIS — E785 Hyperlipidemia, unspecified: Secondary | ICD-10-CM | POA: Diagnosis not present

## 2023-12-31 DIAGNOSIS — I5022 Chronic systolic (congestive) heart failure: Secondary | ICD-10-CM | POA: Insufficient documentation

## 2023-12-31 DIAGNOSIS — Z7985 Long-term (current) use of injectable non-insulin antidiabetic drugs: Secondary | ICD-10-CM | POA: Diagnosis not present

## 2023-12-31 DIAGNOSIS — E119 Type 2 diabetes mellitus without complications: Secondary | ICD-10-CM | POA: Insufficient documentation

## 2023-12-31 DIAGNOSIS — Z86711 Personal history of pulmonary embolism: Secondary | ICD-10-CM | POA: Diagnosis not present

## 2023-12-31 DIAGNOSIS — I428 Other cardiomyopathies: Secondary | ICD-10-CM | POA: Diagnosis not present

## 2023-12-31 DIAGNOSIS — K219 Gastro-esophageal reflux disease without esophagitis: Secondary | ICD-10-CM | POA: Insufficient documentation

## 2023-12-31 DIAGNOSIS — Z9689 Presence of other specified functional implants: Secondary | ICD-10-CM | POA: Insufficient documentation

## 2023-12-31 DIAGNOSIS — I10 Essential (primary) hypertension: Secondary | ICD-10-CM

## 2023-12-31 DIAGNOSIS — D508 Other iron deficiency anemias: Secondary | ICD-10-CM

## 2023-12-31 DIAGNOSIS — Z7984 Long term (current) use of oral hypoglycemic drugs: Secondary | ICD-10-CM | POA: Insufficient documentation

## 2023-12-31 DIAGNOSIS — D649 Anemia, unspecified: Secondary | ICD-10-CM | POA: Insufficient documentation

## 2023-12-31 DIAGNOSIS — R5383 Other fatigue: Secondary | ICD-10-CM | POA: Diagnosis present

## 2023-12-31 DIAGNOSIS — Z87891 Personal history of nicotine dependence: Secondary | ICD-10-CM | POA: Diagnosis not present

## 2023-12-31 DIAGNOSIS — Z794 Long term (current) use of insulin: Secondary | ICD-10-CM | POA: Diagnosis not present

## 2023-12-31 DIAGNOSIS — I5020 Unspecified systolic (congestive) heart failure: Secondary | ICD-10-CM

## 2023-12-31 DIAGNOSIS — J449 Chronic obstructive pulmonary disease, unspecified: Secondary | ICD-10-CM | POA: Insufficient documentation

## 2023-12-31 DIAGNOSIS — M436 Torticollis: Secondary | ICD-10-CM | POA: Diagnosis not present

## 2023-12-31 DIAGNOSIS — K589 Irritable bowel syndrome without diarrhea: Secondary | ICD-10-CM | POA: Diagnosis not present

## 2023-12-31 DIAGNOSIS — I480 Paroxysmal atrial fibrillation: Secondary | ICD-10-CM | POA: Insufficient documentation

## 2023-12-31 DIAGNOSIS — F1120 Opioid dependence, uncomplicated: Secondary | ICD-10-CM | POA: Diagnosis not present

## 2023-12-31 DIAGNOSIS — I5181 Takotsubo syndrome: Secondary | ICD-10-CM | POA: Insufficient documentation

## 2023-12-31 DIAGNOSIS — Z9981 Dependence on supplemental oxygen: Secondary | ICD-10-CM | POA: Insufficient documentation

## 2023-12-31 DIAGNOSIS — G894 Chronic pain syndrome: Secondary | ICD-10-CM | POA: Diagnosis not present

## 2023-12-31 DIAGNOSIS — I4819 Other persistent atrial fibrillation: Secondary | ICD-10-CM

## 2023-12-31 DIAGNOSIS — T85615S Breakdown (mechanical) of other nervous system device, implant or graft, sequela: Secondary | ICD-10-CM | POA: Insufficient documentation

## 2023-12-31 MED ORDER — PAIN MANAGEMENT IT PUMP REFILL: 1.0000 | Freq: Once | INTRATHECAL | 0 refills | Status: AC

## 2023-12-31 NOTE — Patient Instructions (Signed)
 Medication Changes:  No medication changes today!  Lab Work:  Go DOWN to LOWER LEVEL (LL) to have your blood work completed inside of [employer] office.  We will only call you if the results are abnormal or if the provider would like to make medication changes.   Follow-Up in: Please follow up with the [facility] in 2 weeks with Shawnee Dellen, FNP.  At the [facility], you and your health needs are our priority. We have a designated team specialized in the treatment of Heart Failure. This Care Team includes your primary Heart Failure Specialized Cardiologist (physician), Advanced Practice Providers (APPs- Physician Assistants and Nurse Practitioners), and Pharmacist who all work together to provide you with the care you need, when you need it.   You may see any of the following providers on your designated Care Team at your next follow up:  Dr. Jules Oar Dr. Peder Bourdon Dr. Alwin Baars Dr. Judyth Nunnery Shawnee Dellen, FNP Bevely Brush, RPH-CPP  Please be sure to bring in all your medications bottles to every appointment.   Need to Contact Us :  If you have any questions or concerns before your next appointment please send us  a message through [place] or call our office at [phone].    TO LEAVE A MESSAGE FOR THE NURSE SELECT OPTION 2, PLEASE LEAVE A MESSAGE INCLUDING: YOUR NAME DATE OF BIRTH CALL BACK NUMBER REASON FOR CALL**this is important as we prioritize the call backs  YOU WILL RECEIVE A CALL BACK THE SAME DAY AS LONG AS YOU CALL BEFORE 4:00 PM

## 2024-01-01 ENCOUNTER — Ambulatory Visit: Payer: Self-pay | Admitting: Family

## 2024-01-01 LAB — BASIC METABOLIC PANEL WITH GFR
BUN/Creatinine Ratio: 14 (ref 12–28)
BUN: 16 mg/dL (ref 8–27)
CO2: 25 mmol/L (ref 20–29)
Calcium: 9.6 mg/dL (ref 8.7–10.3)
Chloride: 97 mmol/L (ref 96–106)
Creatinine, Ser: 1.13 mg/dL — ABNORMAL HIGH (ref 0.57–1.00)
Glucose: 124 mg/dL — ABNORMAL HIGH (ref 70–99)
Potassium: 5.2 mmol/L (ref 3.5–5.2)
Sodium: 142 mmol/L (ref 134–144)
eGFR: 51 mL/min/{1.73_m2} — ABNORMAL LOW (ref >59)

## 2024-01-11 ENCOUNTER — Other Ambulatory Visit: Payer: Self-pay

## 2024-01-11 MED ORDER — PAIN MANAGEMENT IT PUMP REFILL: 1.0000 | Freq: Once | INTRATHECAL | 0 refills | Status: AC

## 2024-01-13 ENCOUNTER — Telehealth: Payer: Self-pay | Admitting: Family

## 2024-01-13 NOTE — Telephone Encounter (Signed)
 Called to confirm/remind patient of their appointment at the [facility] on 01/14/24.   Appointment:   [x] Confirmed  [] Left mess   [] No answer/No voice mail  [] VM Full/unable to leave message  [] Phone not in service  Patient reminded to bring all medications and/or complete list.  Confirmed patient has transportation. Gave directions, instructed to utilize valet parking.

## 2024-01-14 ENCOUNTER — Encounter: Payer: Self-pay | Admitting: Family

## 2024-01-14 ENCOUNTER — Ambulatory Visit: Payer: Self-pay | Admitting: Family

## 2024-01-14 ENCOUNTER — Other Ambulatory Visit
Admission: RE | Admit: 2024-01-14 | Discharge: 2024-01-14 | Disposition: A | Discharge status: Home / Self Care | Source: Ambulatory Visit | Attending: Family | Admitting: Family

## 2024-01-14 ENCOUNTER — Ambulatory Visit: Admitting: Family

## 2024-01-14 VITALS — BP 121/57 | HR 84 | Wt 146.0 lb

## 2024-01-14 DIAGNOSIS — I10 Essential (primary) hypertension: Secondary | ICD-10-CM | POA: Diagnosis not present

## 2024-01-14 DIAGNOSIS — I5020 Unspecified systolic (congestive) heart failure: Secondary | ICD-10-CM

## 2024-01-14 DIAGNOSIS — G894 Chronic pain syndrome: Secondary | ICD-10-CM

## 2024-01-14 DIAGNOSIS — J449 Chronic obstructive pulmonary disease, unspecified: Secondary | ICD-10-CM | POA: Diagnosis not present

## 2024-01-14 DIAGNOSIS — I4819 Other persistent atrial fibrillation: Secondary | ICD-10-CM

## 2024-01-14 DIAGNOSIS — Z794 Long term (current) use of insulin: Secondary | ICD-10-CM

## 2024-01-14 DIAGNOSIS — E119 Type 2 diabetes mellitus without complications: Secondary | ICD-10-CM | POA: Diagnosis not present

## 2024-01-14 DIAGNOSIS — D508 Other iron deficiency anemias: Secondary | ICD-10-CM

## 2024-01-14 LAB — BASIC METABOLIC PANEL WITH GFR
Anion gap: 7 (ref 5–15)
BUN: 20 mg/dL (ref 8–23)
CO2: 33 mmol/L — ABNORMAL HIGH (ref 22–32)
Calcium: 9.1 mg/dL (ref 8.9–10.3)
Chloride: 100 mmol/L (ref 98–111)
Creatinine, Ser: 1.33 mg/dL — ABNORMAL HIGH (ref 0.44–1.00)
GFR, Estimated: 42 mL/min — ABNORMAL LOW (ref >60)
Glucose, Bld: 86 mg/dL (ref 70–99)
Potassium: 5 mmol/L (ref 3.5–5.1)
Sodium: 140 mmol/L (ref 135–145)

## 2024-01-14 MED ORDER — ENTRESTO 24-26 MG PO TABS: 1.0000 | ORAL_TABLET | Freq: Two times a day (BID) | ORAL | 11 refills | Status: DC

## 2024-01-14 NOTE — Addendum Note (Signed)
 Addended by: Aaradhya Kysar A on: 01/14/2024 02:38 PM   Modules accepted: Orders

## 2024-01-14 NOTE — Progress Notes (Signed)
 [facility] Note     PCP: Lemar Pyles, NP (telemedicine visit 05/25) Cardiologist: Sammy Crisp, MD (last seen 11/24)  Chief Complaint: fatigue   HPI:  Ms Obriant is a 73 y/o female with a history of B12 deficiency, chronic pain syndrome s/p intrathecal pump, paroxysmal atrial fibrillation, stress-induced cardiomyopathy in the setting of pain pump malfunction and opioid withdrawal (2018), HTN, hyperlipidemia, PE (2021), DM, COPD, torticollis, GERD, OSA, breast cancer, chronic back pain,anemia and IBS.   Echo 12/11/20: EF 45% with Grade II DD Echo 09/11/21: EF 25-30% with Grade II DD Echo 10/31/21: EF 40-45% with Grade I DD  Admitted 04/15/23 due to worsening of left hip pain at the surgical site associated with increasing redness about the left hip surgical wound. Tachycardic. WBC 17.6. CXR normal.Thought to be septic due to left hip surgical wound infection. Treated with IV fluids, analgesics and empiric IV antibiotics. Transferred to [facility].    Admitted 08/09/23 due to acute respiratory distress found with sats in the 50 to 60% on RA. Found to have multiple PE's and sepsis due to aspiration pneumonia. Initially intubated. Completed antibiotics & successfully extubated. Underwent swallowing evaluation. Heparin  stopped and transitioned to eliquis . Anemia stable. Echo 08/11/23: EF 25-30% with Grade I DD  She presents today for a HF follow-up visit with fatigue. Has shortness of breath, chronic neck pain from torticollis and chronic difficulty sleeping from the pain. Denies chest pain, palpitations, abdominal distention, pedal edema or dizziness. She has not started entresto yet as staff just reached patient yesterday so she decided to wait until her appt to discuss further.   Wearing oxygen  at 2L around the clock. Quit smoking ~2006 when a grandchild was going to be born. Denies alcohol  use. Not adding salt to her food. Weighing daily    Previous cardiac studies:  LHC 04/28/17:  No angiographically significant coronary artery disease. Severely reduced LV contraction (LVEF 25-30%) with mid and apical akinesis, consistent with Takotsubo (stress-induced) cardiomyopathy. Mildly elevated left ventricular filling pressure.  ROS: All systems negative except what is listed in HPI, PMH and Problem List  Past Medical History:  Diagnosis Date   Arthritis    Asthma    Atrial fibrillation (HCC)    Breast cancer (HCC) 1998   right breast ca with mastectomy and chemotherapy and radiation   Bronchitis    CHF (congestive heart failure) (HCC)    with Morphine  withdrawal   COPD (chronic obstructive pulmonary disease) (HCC)    Diabetes mellitus without complication (HCC)    Diverticulitis    diverticulosis also   Dyspnea    Endometriosis    GERD (gastroesophageal reflux disease)    History of shingles 2000-2005   Hypercholesteremia    Hypertension    IBS (irritable bowel syndrome)    Low back pain    a. Implanted morphine /bupivicaine/clonidine  pump.   Neuropathy    Orthopnea    Oxygen  dependent    uses at night   Personal history of chemotherapy    Personal history of radiation therapy    Pneumonia    pneumonia 5-6 times, history of bronchitis also   Scoliosis    Sleep apnea    does not use cpap   Stroke (HCC) 2010   TIA, 10 years ago   Withdrawal from sedative drug (HCC)    withdrawal from morphine  when pump batteries died    Current Outpatient Medications  Medication Sig Dispense Refill   ACCU-CHEK AVIVA PLUS test strip TEST  THREE TIMES DAILY 300 strip 3   Accu-Chek Softclix Lancets lancets TEST BLOOD SUGAR THREE TIMES DAILY 300 each 3   albuterol  (PROVENTIL ) (2.5 MG/3ML) 0.083% nebulizer solution Take 3 mLs (2.5 mg total) by nebulization every 4 (four) hours as needed for wheezing or shortness of breath. 75 mL 1   albuterol  (VENTOLIN  HFA) 108 (90 Base) MCG/ACT inhaler Inhale 2 puffs into the lungs every 6  (six) hours as needed for wheezing or shortness of breath. 1 each 1   alum & mag hydroxide-simeth (MAALOX/MYLANTA) 200-200-20 MG/5ML suspension Take 30 mLs by mouth every 6 (six) hours as needed for indigestion or heartburn. 300 mL 1   apixaban  (ELIQUIS ) 5 MG TABS tablet Take 1 tablet (5 mg total) by mouth 2 (two) times daily. 180 tablet 3   atorvastatin  (LIPITOR) 40 MG tablet Take 1 tablet (40 mg total) by mouth daily. 90 tablet 3   Blood Glucose Monitoring Suppl (TRUE METRIX METER) w/Device KIT Use to check blood sugar 4 times a day 1 kit 4   busPIRone  (BUSPAR ) 5 MG tablet Take 1 tablet (5 mg total) by mouth 2 (two) times daily. 30 tablet 1   cholecalciferol  (VITAMIN D3) 25 MCG (1000 UNIT) tablet Take 1,000 Units by mouth daily.     Continuous Glucose Receiver (FREESTYLE LIBRE 2 READER) DEVI To check blood sugars 3-4 times daily due to insulin  use. DX E11.40 2 each 5   Continuous Glucose Sensor (FREESTYLE LIBRE 2 SENSOR) MISC USE 1 SENSOR TO CHECK BLOOD SUGARS 3-4 TIMES DAILY DUE TO INSULIN  USE 2 each 5   Cyanocobalamin  1000 MCG/ML KIT Inject 1,000 mcg as directed every 30 (thirty) days. Next injection 3/21     cyclobenzaprine  (FLEXERIL ) 5 MG tablet Take 5 mg by mouth daily as needed for muscle spasms.     dapagliflozin  propanediol (FARXIGA ) 10 MG TABS tablet Take 1 tablet (10 mg total) by mouth daily before breakfast. 90 tablet 3   docusate sodium  (COLACE) 100 MG capsule Take 1 capsule (100 mg total) by mouth 2 (two) times daily as needed for mild constipation. 30 capsule 0   Dulaglutide  (TRULICITY ) 4.5 MG/0.5ML SOAJ Inject 4.5 mg into the skin once a week. 12 mL 3   ferrous sulfate  325 (65 FE) MG EC tablet Take 1 tablet (325 mg total) by mouth 2 (two) times daily. 60 tablet 1   folic acid  (FOLVITE ) 1 MG tablet TAKE 1 TABLET EVERY DAY 90 tablet 0   furosemide  (LASIX ) 40 MG tablet Take 0.5 tablets (20 mg total) by mouth daily. 30 tablet 1   gabapentin  (NEURONTIN ) 400 MG capsule Take 1 capsule (400  mg total) by mouth 3 (three) times daily. 90 capsule 12   HYDROcodone -acetaminophen  (NORCO/VICODIN) 5-325 MG tablet Take 1 tablet by mouth every 4 (four) hours as needed.     insulin  lispro (HUMALOG  KWIKPEN) 100 UNIT/ML KwikPen Inject 5 Units into the skin 3 (three) times daily before meals. Do not inject insulin  if you are not going to eat meal.  Only take before a meal. 15 mL 3   linaclotide (LINZESS) 145 MCG CAPS capsule Take 145 mcg by mouth daily before breakfast.     losartan  (COZAAR ) 25 MG tablet Take 0.5 tablets (12.5 mg total) by mouth daily. 45 tablet 3   metFORMIN  (GLUCOPHAGE ) 1000 MG tablet Take 1 tablet (1,000 mg total) by mouth 2 (two) times daily with a meal. 180 tablet 3   metoprolol  succinate (TOPROL -XL) 25 MG 24 hr tablet Take 1  tablet (25 mg total) by mouth daily. Take with or immediately following a meal. 30 tablet 11   montelukast  (SINGULAIR ) 10 MG tablet Take 1 tablet (10 mg total) by mouth at bedtime. 90 tablet 3   naloxone  (NARCAN ) nasal spray 4 mg/0.1 mL Place 1 spray into the nose as needed for up to 365 doses (for opioid-induced respiratory depresssion). In case of emergency (overdose), spray once into each nostril. If no response within 3 minutes, repeat application and call 911. 1 each 1   Nebulizers (EASY AIR COMPRESSOR NEBULIZER) MISC Use to inhaler nebulizer treatments at needed per instructions on nebulizer prescription 1 each 0   ondansetron  (ZOFRAN ) 4 MG tablet Take 1 tablet (4 mg total) by mouth daily as needed for nausea or vomiting. 30 tablet 1   OXYGEN  Inhale 2 L into the lungs at bedtime.     PAIN MANAGEMENT INTRATHECAL, IT, PUMP 1 each by Intrathecal route. Intrathecal (IT) medication:  Morphine  5.0 mg/ml, Bupivacaine 20.0 mg/ml,  Clonidine  100.0 mcg/ml Patient does not remember current. Adjusted every 3 months at [facility]     pantoprazole  (PROTONIX ) 40 MG tablet Take 1 tablet (40 mg total) by mouth daily. 90 tablet 3   polyethylene glycol (MIRALAX   / GLYCOLAX ) 17 g packet Take 17 g by mouth daily as needed for moderate constipation. 10 packet 0   TRELEGY ELLIPTA  100-62.5-25 MCG/ACT AEPB INHALE 1 PUFF INTO THE LUNGS DAILY . 42 DAY EXPIRATION AFTER OPENING FOIL TRAY 180 each 3   TRESIBA  FLEXTOUCH 100 UNIT/ML FlexTouch Pen Inject 55 Units into the skin at bedtime. 30 mL 5   venlafaxine  XR (EFFEXOR -XR) 150 MG 24 hr capsule Take 1 capsule (150 mg total) by mouth daily. 90 capsule 3   No current facility-administered medications for this visit.    Allergies  Allergen Reactions   Other Palpitations    IV steroids   Pain Patch [Menthol] Anaphylaxis   Jardiance  [Empagliflozin ] Other (See Comments)    Yeast infection & dry mouth   Prednisone  Other (See Comments)    prednisone    Avelox  [Moxifloxacin  Hcl In Nacl] Other (See Comments)    Upset stomach   Doxycycline  Diarrhea   Erythromycin  Nausea Only and Other (See Comments)    Can take a Z-Pak just fine Other reaction(s): Other (See Comments) Can take Z-Pak  Can take a Z-Pak just fine   Fentanyl  Nausea Only and Rash   Moxifloxacin  Hcl Other (See Comments)    Upset stomach Upset stomach   Oxycontin [Oxycodone] Hives   Ozempic [Semaglutide] Nausea Only      Social History   Socioeconomic History   Marital status: Legally Separated    Spouse name: Mont Antis   Number of children: 2   Years of education: Not on file   Highest education level: High school graduate  Occupational History   Occupation: retired  Tobacco Use   Smoking status: Former    Current packs/day: 0.00    Average packs/day: 1 pack/day for 30.0 years (30.0 ttl pk-yrs)    Types: Cigarettes    Start date: 04/30/1974    Quit date: 04/30/2004    Years since quitting: 19.7    Passive exposure: Past   Smokeless tobacco: Never  Vaping Use   Vaping status: Never Used  Substance and Sexual Activity   Alcohol  use: No   Drug use: No   Sexual activity: Not Currently    Birth control/protection: Post-menopausal  Other  Topics Concern   Not on file  Social History Narrative   ** Merged History Encounter **       Social Drivers of Health   Financial Resource Strain: Low Risk  (12/29/2023)   Overall Financial Resource Strain (CARDIA)    Difficulty of Paying Living Expenses: Not hard at all  Food Insecurity: No Food Insecurity (12/29/2023)   Hunger Vital Sign    Worried About Running Out of Food in the Last Year: Never true    Ran Out of Food in the Last Year: Never true  Transportation Needs: No Transportation Needs (12/29/2023)   PRAPARE - Administrator, Civil Service (Medical): No    Lack of Transportation (Non-Medical): No  Physical Activity: Inactive (12/29/2023)   Exercise Vital Sign    Days of Exercise per Week: 0 days    Minutes of Exercise per Session: 0 min  Stress: No Stress Concern Present (12/29/2023)   [employer] of Occupational Health - Occupational Stress Questionnaire    Feeling of Stress : Only a little  Social Connections: Moderately Isolated (12/29/2023)   Social Connection and Isolation Panel    Frequency of Communication with Friends and Family: More than three times a week    Frequency of Social Gatherings with Friends and Family: More than three times a week    Attends Religious Services: More than 4 times per year    Active Member of [employer] or Organizations: No    Attends Banker Meetings: Never    Marital Status: Separated  Intimate Partner Violence: Not At Risk (12/29/2023)   Humiliation, Afraid, Rape, and Kick questionnaire    Fear of Current or Ex-Partner: No    Emotionally Abused: No    Physically Abused: No    Sexually Abused: No      Family History  Problem Relation Age of Onset   Cancer Mother 26       lung   Thyroid  disease Mother    Cancer Father 63       Pancreatic   Hypertension Sister    Cancer Sister        cancer on face   Hyperlipidemia Sister    Osteoporosis Maternal Grandmother    Cancer Paternal Grandmother         colon   Arthritis Paternal Grandfather    Hyperlipidemia Son    Seizures Son    Breast cancer Neg Hx    Vitals:   01/14/24 1339  BP: (!) 121/57  Pulse: 84  SpO2: 97%  Weight: 146 lb (66.2 kg)   Wt Readings from Last 3 Encounters:  01/14/24 146 lb (66.2 kg)  12/31/23 142 lb 6.4 oz (64.6 kg)  12/29/23 142 lb (64.4 kg)   Lab Results  Component Value Date   CREATININE 1.13 (H) 12/31/2023   CREATININE 1.06 (H) 12/18/2023   CREATININE 1.08 (H) 11/26/2023    PHYSICAL EXAM:  General: Well appearing. No resp difficulty HEENT: normal Neck: supple, no JVD, head is being pulled to the left due to torticollis Cor: Regular rhythm, rate. No rubs, gallops or murmurs Lungs: clear Abdomen: soft, nontender, nondistended. Extremities: no cyanosis, clubbing, rash, edema Neuro: alert & oriented X 3. Moves all 4 extremities w/o difficulty. Affect pleasant    ECG: not done   ASSESSMENT & PLAN:  1: NICM with reduced ejection fraction- - suspect due to stress induced after pain pump malfunction & opioid withdrawal in 2018 - NYHA class II - euvolemic - weighing daily; reminded to call for overnight weight gain of >  2 pounds or weekly weight gain of > 5 pounds - weight up 4 pounds from last visit here 2 weeks ago - Echo 12/11/20: EF 45% with Grade II DD - Echo 09/11/21: EF 25-30% with Grade II DD - Echo 10/31/21: EF 40-45% with Grade I DD - Echo 08/11/23: EF 25-30% with Grade I DD - continue farxiga  10mg  daily - continue furosemide  20mg  daily - continue losartan  12.5mg  daily; discussed changing to entresto 24/26mg  BID; patient is agreeable but we will get updated labs back today to make sure K+ has not risen further - continue metoprolol  succinate 25mg  daily - recent potassium too high for MRA - BMET today - not adding salt to her food - BNP 08/09/23 was 241.9  2: HTN- - BP 121/57 - saw PCP Alton Au) 05/25 - BMET 12/31/23 reviewed: sodium 142, potassium 5.2, creatinine 1.13 & GFR 51 -  BMET today  3: DM- - A1c 06/22/23 was 7.4% - continue insulin  - continue metformin  1000mg  BID  4: COPD- - saw pulmonology Jamal Mays) 05/25 - wearing oxygen  at 2L around the clock but does take it off at times at home  5: Atrial fibrillation- - saw cardiology (End) 11/24 - continue apixaban  5mg  BID - continue metoprolol  succinate 25mg  daily  6: Chronic pain- - follows with pain clinic - has chronic intrathecal pain pump; due to be replaced 07/25 - continue gabapentin  400mg  TID  7: Anemia- - Hg 12/18/23 reviewed and was 12.0 - saw GI Marcille Severance) 03/25 - continue ferrous sulfate  325mg  BID    Return in 1 month, sooner if needed  Charlette Console, FNP 01/14/24

## 2024-01-14 NOTE — Patient Instructions (Signed)
 Medication Changes:  Discontinue Losartan   Start entresto 24-26 MG twice daily after we give you your lab results   Lab Work:  Go over to the [facility]. Go pass the gift shop and have your blood work completed.  We will only call you if the results are abnormal or if the provider would like to make medication changes.   Follow-Up in: 1 month with Shawnee Dellen, FNP.  At the [facility], you and your health needs are our priority. We have a designated team specialized in the treatment of Heart Failure. This Care Team includes your primary Heart Failure Specialized Cardiologist (physician), Advanced Practice Providers (APPs- Physician Assistants and Nurse Practitioners), and Pharmacist who all work together to provide you with the care you need, when you need it.   You may see any of the following providers on your designated Care Team at your next follow up:  Dr. Jules Oar Dr. Peder Bourdon Dr. Alwin Baars Dr. Judyth Nunnery Shawnee Dellen, FNP Bevely Brush, RPH-CPP  Please be sure to bring in all your medications bottles to every appointment.   Need to Contact Us :  If you have any questions or concerns before your next appointment please send us  a message through [facility] or call our office at [phone].    TO LEAVE A MESSAGE FOR THE NURSE SELECT OPTION 2, PLEASE LEAVE A MESSAGE INCLUDING: YOUR NAME DATE OF BIRTH CALL BACK NUMBER REASON FOR CALL**this is important as we prioritize the call backs  YOU WILL RECEIVE A CALL BACK THE SAME DAY AS LONG AS YOU CALL BEFORE 4:00 PM

## 2024-01-15 ENCOUNTER — Other Ambulatory Visit: Payer: Self-pay

## 2024-01-15 MED ORDER — METOPROLOL SUCCINATE ER 50 MG PO TB24: 50.0000 mg | ORAL_TABLET | Freq: Every day | ORAL | 3 refills | Status: AC

## 2024-01-18 ENCOUNTER — Ambulatory Visit

## 2024-01-18 DIAGNOSIS — E538 Deficiency of other specified B group vitamins: Secondary | ICD-10-CM | POA: Diagnosis not present

## 2024-01-18 MED ORDER — CYANOCOBALAMIN 1000 MCG/ML IJ SOLN
1000.0000 ug | INTRAMUSCULAR | Status: AC
  Administered 2024-01-18 – 2024-08-23 (×8): 1000 ug via INTRAMUSCULAR

## 2024-01-18 NOTE — Progress Notes (Addendum)
 Patient is in office today for a nurse visit for B12 Injection. Patient Injection was given in the  Right deltoid. Patient tolerated injection well.

## 2024-01-28 ENCOUNTER — Emergency Department

## 2024-01-28 ENCOUNTER — Other Ambulatory Visit: Payer: Self-pay

## 2024-01-28 ENCOUNTER — Emergency Department
Admission: EM | Admit: 2024-01-28 | Discharge: 2024-01-28 | Disposition: A | Discharge status: Home / Self Care | Attending: Emergency Medicine | Admitting: Emergency Medicine

## 2024-01-28 DIAGNOSIS — J449 Chronic obstructive pulmonary disease, unspecified: Secondary | ICD-10-CM | POA: Insufficient documentation

## 2024-01-28 DIAGNOSIS — Z7901 Long term (current) use of anticoagulants: Secondary | ICD-10-CM | POA: Insufficient documentation

## 2024-01-28 DIAGNOSIS — D72829 Elevated white blood cell count, unspecified: Secondary | ICD-10-CM | POA: Diagnosis not present

## 2024-01-28 DIAGNOSIS — R0789 Other chest pain: Secondary | ICD-10-CM

## 2024-01-28 DIAGNOSIS — K529 Noninfective gastroenteritis and colitis, unspecified: Secondary | ICD-10-CM

## 2024-01-28 DIAGNOSIS — R110 Nausea: Secondary | ICD-10-CM

## 2024-01-28 LAB — BASIC METABOLIC PANEL WITH GFR
Anion gap: 14 (ref 5–15)
BUN: 18 mg/dL (ref 8–23)
CO2: 25 mmol/L (ref 22–32)
Calcium: 8.6 mg/dL — ABNORMAL LOW (ref 8.9–10.3)
Chloride: 100 mmol/L (ref 98–111)
Creatinine, Ser: 1.06 mg/dL — ABNORMAL HIGH (ref 0.44–1.00)
GFR, Estimated: 55 mL/min — ABNORMAL LOW (ref >60)
Glucose, Bld: 211 mg/dL — ABNORMAL HIGH (ref 70–99)
Potassium: 3.8 mmol/L (ref 3.5–5.1)
Sodium: 139 mmol/L (ref 135–145)

## 2024-01-28 LAB — CBC
HCT: 34.9 % — ABNORMAL LOW (ref 36.0–46.0)
Hemoglobin: 10.9 g/dL — ABNORMAL LOW (ref 12.0–15.0)
MCH: 29.2 pg (ref 26.0–34.0)
MCHC: 31.2 g/dL (ref 30.0–36.0)
MCV: 93.6 fL (ref 80.0–100.0)
Platelets: 374 10*3/uL (ref 150–400)
RBC: 3.73 MIL/uL — ABNORMAL LOW (ref 3.87–5.11)
RDW: 14.3 % (ref 11.5–15.5)
WBC: 13.2 10*3/uL — ABNORMAL HIGH (ref 4.0–10.5)
nRBC: 0 % (ref 0.0–0.2)

## 2024-01-28 LAB — CBG MONITORING, ED: Glucose-Capillary: 106 mg/dL — ABNORMAL HIGH (ref 70–99)

## 2024-01-28 LAB — TROPONIN I (HIGH SENSITIVITY)
Troponin I (High Sensitivity): 5 ng/L (ref <18)
Troponin I (High Sensitivity): 5 ng/L (ref <18)

## 2024-01-28 NOTE — ED Provider Notes (Signed)
 [facility] Provider Note    Event Date/Time   First MD Initiated Contact with Patient 01/28/24 2136     (approximate)   History   Shortness of Breath   HPI  Jordan Chan is a 73 y.o. female who presents to the ED for evaluation of Shortness of Breath   Review of cardiology clinic visit from 2 weeks ago.  She of chronic pain syndrome with an intrathecal pump, paroxysmal A-fib, stress-induced cardiomyopathy in the setting of pump malfunction 2018, PE 2021, COPD.  EF 25%.  On Eliquis   Patient presents to the ED asymptomatic after an episode of nausea, passing stool and chest pressure that has since resolved.  This afternoon she was at home in her typical state of health.  Developed a flushed sensation, nausea and had to run to the bathroom and reports 5 episodes of passing stool in 30 minutes.  Not diarrhea, just copious stool.  After this her abdomen felt better, she never had pain, but she then developed pressure in her chest that lasted up to 2 hours before self resolving.  Due to her cardiac history presents to the ED.  Reports that she feels fine now though.   Physical Exam   Triage Vital Signs: ED Triage Vitals  Encounter Vitals Group     BP 01/28/24 1844 (!) 101/46     Girls Systolic BP Percentile --      Girls Diastolic BP Percentile --      Boys Systolic BP Percentile --      Boys Diastolic BP Percentile --      Pulse Rate 01/28/24 1844 (!) 108     Resp 01/28/24 1844 20     Temp 01/28/24 1844 98.2 F (36.8 C)     Temp Source 01/28/24 1844 Oral     SpO2 01/28/24 1828 98 %     Weight 01/28/24 1845 142 lb (64.4 kg)     Height 01/28/24 1845 4' 10.5 (1.486 m)     Head Circumference --      Peak Flow --      Pain Score 01/28/24 1845 0     Pain Loc --      Pain Education --      Exclude from Growth Chart --     Most recent vital signs: Vitals:   01/28/24 2230 01/28/24 2300  BP: (!) 115/52 119/63  Pulse: 90 86  Resp:    Temp:     SpO2: 95% 93%    General: Awake, no distress.  Well-appearing, pleasant and conversational CV:  Good peripheral perfusion.  Resp:  Normal effort.  Abd:  No distention.  Soft and benign throughout, no tenderness, guarding or peritoneal features. MSK:  No deformity noted.  Neuro:  No focal deficits appreciated. Other:     ED Results / Procedures / Treatments   Labs (all labs ordered are listed, but only abnormal results are displayed) Labs Reviewed  BASIC METABOLIC PANEL WITH GFR - Abnormal; Notable for the following components:      Result Value   Glucose, Bld 211 (*)    Creatinine, Ser 1.06 (*)    Calcium  8.6 (*)    GFR, Estimated 55 (*)    All other components within normal limits  CBC - Abnormal; Notable for the following components:   WBC 13.2 (*)    RBC 3.73 (*)    Hemoglobin 10.9 (*)    HCT 34.9 (*)    All other components within  normal limits  CBG MONITORING, ED - Abnormal; Notable for the following components:   Glucose-Capillary 106 (*)    All other components within normal limits  TROPONIN I (HIGH SENSITIVITY)  TROPONIN I (HIGH SENSITIVITY)    EKG Sinus rhythm with a rate of 99 bpm.  Normal axis and intervals without signs of acute ischemia.  RADIOLOGY CXR interpreted by me without evidence of acute cardiopulmonary pathology.  Official radiology report(s): DG Chest 1 View Result Date: 01/28/2024 CLINICAL DATA:  10026 Shortness of breath 10026 EXAM: CHEST  1 VIEW COMPARISON:  Chest x-ray 08/13/2023 FINDINGS: The heart and mediastinal contours are within normal limits. Atherosclerotic plaque. No focal consolidation. Chronic coarsened interstitial markings with no overt pulmonary edema. No pleural effusion. No pneumothorax. No acute osseous abnormality. IMPRESSION: 1. No active disease. 2.  Aortic Atherosclerosis (ICD10-I70.0). Electronically Signed   By: Morgane  Naveau M.D.   On: 01/28/2024 19:51    PROCEDURES and INTERVENTIONS:  .1-3 Lead EKG  Interpretation  Performed by: Claudene Rover, MD Authorized by: Claudene Rover, MD     Interpretation: normal     ECG rate:  80   ECG rate assessment: normal     Rhythm: sinus rhythm     Ectopy: none     Conduction: normal     Medications - No data to display   IMPRESSION / MDM / ASSESSMENT AND PLAN / ED COURSE  I reviewed the triage vital signs and the nursing notes.  Differential diagnosis includes, but is not limited to, ACS, PTX, PNA, muscle strain/spasm, PE, dissection, anxiety, pleural effusion  {Patient presents with symptoms of an acute illness or injury that is potentially life-threatening.  Patient presents after a an atypical episode of chest discomfort with a benign workup and suitable for outpatient management.  Reassuring vital signs and exam.  Looks well to me with a normal exam.  She is asymptomatic.  2 negative troponins and a nonischemic EKG.  Renal dysfunction near baseline, mild hyperglycemia without acidosis.  Marginal leukocytosis but I doubt infectious etiology for symptoms.  I considered admission for this patient but believe she would be suitable for outpatient management.      FINAL CLINICAL IMPRESSION(S) / ED DIAGNOSES   Final diagnoses:  Nausea  Frequent stools  Chest pressure     Rx / DC Orders   ED Discharge Orders     None        Note:  This document was prepared using Dragon voice recognition software and may include unintentional dictation errors.   Claudene Rover, MD 01/28/24 [phone]

## 2024-01-28 NOTE — ED Triage Notes (Signed)
 Pt arrives via EMS from home with reports of CP for 2 hours. Endorses nausea. No ASA given due to eliquis . 4 mg zofran  and nitroglycerin  spray given.

## 2024-01-28 NOTE — ED Triage Notes (Signed)
 Pt to ED via POV from home. Pt reports started to feel nausea and felt like someone was sitting on her chest earlier. Pt reports nausea. Pt is on eliquis . Pt given 4mg  of zofran  and nitroglycerin  spray. Pt reports relief of symptoms after meds.

## 2024-01-29 ENCOUNTER — Telehealth: Payer: Self-pay | Admitting: Nurse Practitioner

## 2024-01-29 ENCOUNTER — Other Ambulatory Visit: Payer: Self-pay | Admitting: Neurology

## 2024-01-29 DIAGNOSIS — G243 Spasmodic torticollis: Secondary | ICD-10-CM

## 2024-01-29 DIAGNOSIS — M5412 Radiculopathy, cervical region: Secondary | ICD-10-CM

## 2024-01-29 NOTE — Telephone Encounter (Signed)
 Copied from CRM [id number]. Topic: Referral - Question >> Jan 29, 2024  8:08 AM Powell HERO wrote: Reason for CRM: [facility] clinic Neurology requests a referral sent over for this patient to be seen in their office. She has an appointment scheduled today. Wells is calling to see if her PCP can send over a referral. She states the patient was originally given the referral through [facility] but she needs it from her PCP. Please advise. Fax [phone]. Callback [phone].

## 2024-01-29 NOTE — Telephone Encounter (Signed)
 Call to office states patient was referral to them for cervical neuralgia.

## 2024-02-01 ENCOUNTER — Ambulatory Visit: Attending: Pain Medicine | Admitting: Pain Medicine

## 2024-02-01 ENCOUNTER — Encounter: Payer: Self-pay | Admitting: Pain Medicine

## 2024-02-01 VITALS — BP 123/71 | HR 80 | Temp 97.9°F | Ht <= 58 in | Wt 142.0 lb

## 2024-02-01 DIAGNOSIS — M5410 Radiculopathy, site unspecified: Secondary | ICD-10-CM | POA: Insufficient documentation

## 2024-02-01 DIAGNOSIS — Z9981 Dependence on supplemental oxygen: Secondary | ICD-10-CM | POA: Insufficient documentation

## 2024-02-01 DIAGNOSIS — M5382 Other specified dorsopathies, cervical region: Secondary | ICD-10-CM | POA: Insufficient documentation

## 2024-02-01 DIAGNOSIS — J449 Chronic obstructive pulmonary disease, unspecified: Secondary | ICD-10-CM | POA: Diagnosis present

## 2024-02-01 DIAGNOSIS — M4152 Other secondary scoliosis, cervical region: Secondary | ICD-10-CM | POA: Insufficient documentation

## 2024-02-01 DIAGNOSIS — Z978 Presence of other specified devices: Secondary | ICD-10-CM | POA: Insufficient documentation

## 2024-02-01 DIAGNOSIS — M79605 Pain in left leg: Secondary | ICD-10-CM | POA: Diagnosis present

## 2024-02-01 DIAGNOSIS — M545 Low back pain, unspecified: Secondary | ICD-10-CM | POA: Insufficient documentation

## 2024-02-01 DIAGNOSIS — G894 Chronic pain syndrome: Secondary | ICD-10-CM | POA: Insufficient documentation

## 2024-02-01 DIAGNOSIS — M79604 Pain in right leg: Secondary | ICD-10-CM | POA: Insufficient documentation

## 2024-02-01 DIAGNOSIS — Z7901 Long term (current) use of anticoagulants: Secondary | ICD-10-CM | POA: Diagnosis present

## 2024-02-01 DIAGNOSIS — Z462 Encounter for fitting and adjustment of other devices related to nervous system and special senses: Secondary | ICD-10-CM | POA: Insufficient documentation

## 2024-02-01 DIAGNOSIS — M436 Torticollis: Secondary | ICD-10-CM | POA: Insufficient documentation

## 2024-02-01 DIAGNOSIS — G8929 Other chronic pain: Secondary | ICD-10-CM | POA: Insufficient documentation

## 2024-02-01 NOTE — Progress Notes (Signed)
 Safety precautions to be maintained throughout the outpatient stay will include: orient to surroundings, keep bed in low position, maintain call bell within reach at all times, provide assistance with transfer out of bed and ambulation.

## 2024-02-01 NOTE — H&P (View-Only) (Signed)
 PROVIDER NOTE: Interpretation of information contained herein should be left to medically-trained personnel. Specific patient instructions are provided elsewhere under Patient Instructions section of medical record. This document was created in part using AI and STT-dictation technology, any transcriptional errors that may result from this process are unintentional.  Patient: Jordan Chan  Service: E/M   PCP: Jordan Melanie DASEN, NP  DOB: Jan 18, 1951  DOS: 02/01/2024  Provider: Eric DELENA Como, MD  MRN: [id number]  Delivery: Face-to-face  Specialty: Interventional Pain Management  Type: Established Patient  Setting: Ambulatory outpatient facility  Specialty designation: 09  Referring Prov.: Jordan Melanie DASEN, NP  Location: Outpatient office facility       History of present illness (HPI) [name], a 73 y.o. year old female, is here today because of her End of battery life of intrathecal infusion pump [Z46.2]. [name] primary complain today is Back Pain (lower)  Pertinent problems: [name] has History of breast cancer; Diabetic peripheral neuropathy (HCC); Torticollis; Cervical scoliosis; Chronic low back pain (1ry area of Pain) (Bilateral) (R>L) w/o sciatica; Chronic pain syndrome; History of sarcoma; Presence of intrathecal pump; End of battery life of intrathecal infusion pump; Status post total hip replacement, left; History of radiation therapy; History of antineoplastic chemotherapy; Chronic hip pain (Left); Abnormal MRI, lumbar spine (08/26/2019); Chronic lower extremity pain (2ry area of Pain) (Bilateral) (R>L); Chronic neck pain (3ry area of Pain) (Bilateral) (L>R); Cervicalgia; Abnormal x-ray of cervical spine (07/04/2023); DDD (degenerative disc disease), lumbosacral w/ LBP & LEP; Chronic radicular pain of lower extremity; Encounter for adjustment and management of infusion pump; Decreased range of motion of intervertebral discs of cervical spine; and Supplemental  oxygen  dependent on their pertinent problem list.  Pain Assessment: Severity of Chronic pain is reported as a 4 /10. Location: Back  /Denies. Onset: More than a month ago. Quality: Aching, Burning. Timing: Constant. Modifying factor(s): rest, pain pump. Vitals:  height is 4' 10 (1.473 m) and weight is 142 lb (64.4 kg). Her temperature is 97.9 F (36.6 C). Her blood pressure is 123/71 and her pulse is 80. Her oxygen  saturation is 100%.  BMI: Estimated body mass index is 29.68 kg/m as calculated from the following:   Height as of this encounter: 4' 10 (1.473 m).   Weight as of this encounter: 142 lb (64.4 kg).  Last encounter: 06/17/2023. Last procedure: 12/15/2023.  Reason for encounter: evaluation for possible interventional PM therapy/treatment.  The patient comes into the clinic today for a preoperative eval evaluation prior to intrathecal pump replacement secondary to end of battery life.  Discussed the use of AI scribe software for clinical note transcription with the patient, who gave verbal consent to proceed.  History of Present Illness   Jordan Chan is a 73 year old female who presents for pre-operative evaluation prior to pump replacement surgery.  She is scheduled for pump replacement surgery on July 10th, which involves removing the old pump and replacing it with a new one.  She has diabetes, requiring coordination with anesthesia for medication management on the day of surgery. Her diabetes management is a critical aspect of her pre-operative preparation.  She is concerned about her oxygen  levels due to her history of chronic lung disease. Her oxygen  levels have been stable, and she has received clearance from her pulmonologist, whom she will see again tomorrow for further evaluation.  She experiences neck pain, which she attributes to carrying a vest, and has issues with range of motion in her  neck. This condition may impact her intubation during anesthesia.       Pharmacotherapy Assessment   Analgesic: morphine  sulfate powder (for intrathecal pump use)  MME/day: 2.5 mg/day (Intrathecal)   Monitoring: [place] PMP: PDMP reviewed during this encounter.       Pharmacotherapy: No side-effects or adverse reactions reported. Compliance: No problems identified. Effectiveness: Clinically acceptable.  Jordan Dorothe LABOR, RN  02/01/2024  9:48 AM  Sign when Signing Visit Safety precautions to be maintained throughout the outpatient stay will include: orient to surroundings, keep bed in low position, maintain call bell within reach at all times, provide assistance with transfer out of bed and ambulation.     UDS:  Summary  Date Value Ref Range Status  06/17/2023 Note  Final    Comment:    ==================================================================== Compliance Drug Analysis, Ur ==================================================================== Test                             Result       Flag       Units  Drug Present and Declared for Prescription Verification   Gabapentin                      PRESENT      EXPECTED   Venlafaxine                     PRESENT      EXPECTED   Desmethylvenlafaxine           PRESENT      EXPECTED    Desmethylvenlafaxine is an expected metabolite of venlafaxine .    Metoprolol                      PRESENT      EXPECTED  Drug Present not Declared for Prescription Verification   Morphine                        1930         UNEXPECTED ng/mg creat    Potential sources of morphine  include administration of codeine or    morphine , use of heroin, or ingestion of poppy seeds.  Drug Absent but Declared for Prescription Verification   Hydrocodone                     Not Detected UNEXPECTED ng/mg creat   Cyclobenzaprine                 Not Detected UNEXPECTED   Acetaminophen                   Not Detected UNEXPECTED    Acetaminophen , as indicated in the declared medication list, is not    always detected even when used as  directed.  ==================================================================== Test                      Result    Flag   Units      Ref Range   Creatinine              97               mg/dL      >=79 ==================================================================== Declared Medications:  The flagging and interpretation on this report are based on the  following declared medications.  Unexpected results may arise from  inaccuracies in the declared medications.   **Note: The  testing scope of this panel includes these medications:   Cyclobenzaprine  (Flexeril )  Gabapentin   Hydrocodone  (Norco)  Metoprolol  (Toprol )  Venlafaxine  (Effexor )   **Note: The testing scope of this panel does not include small to  moderate amounts of these reported medications:   Acetaminophen  (Norco)   **Note: The testing scope of this panel does not include the  following reported medications:   Albuterol   Apixaban  (Eliquis )  Atorvastatin  (Lipitor)  Buspirone  (Buspar )  Dulaglutide  (Trulicity )  Fluticasone  (Trelegy)  Folic Acid   Furosemide  (Lasix )  Insulin  (Tresiba )  Losartan  (Cozaar )  Metformin   Montelukast   Ondansetron  (Zofran )  Pantoprazole  (Protonix )  Umeclidinium (Trelegy)  Vilanterol (Trelegy)  Vitamin B12  Vitamin D3 ==================================================================== For clinical consultation, please call [phone]. ====================================================================     No results found for: CBDTHCR No results found for: D8THCCBX No results found for: D9THCCBX  ROS  Constitutional: Denies any fever or chills Gastrointestinal: No reported hemesis, hematochezia, vomiting, or acute GI distress Musculoskeletal: Denies any acute onset joint swelling, redness, loss of ROM, or weakness Neurological: No reported episodes of acute onset apraxia, aphasia, dysarthria, agnosia, amnesia, paralysis, loss of coordination, or loss of  consciousness  Medication Review  Accu-Chek Softclix Lancets, Cyanocobalamin , Dulaglutide , Easy Passenger transport manager, Fluticasone -Umeclidin-Vilant, FreeStyle Libre 2 Reader, [employer] 2 Sensor, HYDROcodone -acetaminophen , Oxygen -Helium, PAIN MANAGEMENT INTRATHECAL (IT) PUMP, True Metrix Meter, albuterol , alum & mag hydroxide-simeth, apixaban , atorvastatin , busPIRone , cholecalciferol , cyclobenzaprine , dapagliflozin  propanediol, docusate sodium , ferrous sulfate , folic acid , furosemide , gabapentin , glucose blood, insulin  degludec, insulin  lispro, linaclotide, metFORMIN , metoprolol  succinate, montelukast , naloxone , ondansetron , pantoprazole , polyethylene glycol, sacubitril-valsartan, and venlafaxine  XR  History Review  Allergy: Jordan Chan is allergic to other, pain patch [menthol], jardiance  [empagliflozin ], prednisone , avelox  [moxifloxacin  hcl in nacl], doxycycline , erythromycin , fentanyl , moxifloxacin  hcl, oxycontin [oxycodone], and ozempic [semaglutide]. Drug: Jordan Chan  reports no history of drug use. Alcohol :  reports no history of alcohol  use. Tobacco:  reports that she quit smoking about 19 years ago. Her smoking use included cigarettes. She started smoking about 49 years ago. She has a 30 pack-year smoking history. She has been exposed to tobacco smoke. She has never used smokeless tobacco. Social: Jordan Chan  reports that she quit smoking about 19 years ago. Her smoking use included cigarettes. She started smoking about 49 years ago. She has a 30 pack-year smoking history. She has been exposed to tobacco smoke. She has never used smokeless tobacco. She reports that she does not drink alcohol  and does not use drugs. Medical:  has a past medical history of Arthritis, Asthma, Atrial fibrillation (HCC), Breast cancer (HCC) (1998), Bronchitis, CHF (congestive heart failure) (HCC), COPD (chronic obstructive pulmonary disease) (HCC), Diabetes mellitus without complication (HCC),  Diverticulitis, Dyspnea, Endometriosis, GERD (gastroesophageal reflux disease), History of shingles (2000-2005), Hypercholesteremia, Hypertension, IBS (irritable bowel syndrome), Low back pain, Neuropathy, Orthopnea, Oxygen  dependent, Personal history of chemotherapy, Personal history of radiation therapy, Pneumonia, Scoliosis, Sleep apnea, Stroke (HCC) (2010), and 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 Surgical: Jordan Chan  has a past surgical history that includes Abdominal hysterectomy (1987); Elbow arthroscopy with tendon reconstruction; morphine  pump (2011); Plantar fascia release; Esophagogastroduodenoscopy (egd) with propofol  (N/A, 01/01/2017); Colonoscopy with propofol  (N/A, 01/01/2017); LEFT HEART CATH AND CORONARY ANGIOGRAPHY (N/A, 04/28/2017); Mastectomy (Right, 06/1997); Back surgery; Breast surgery (Right); Cardiac catheterization; Trigger finger release; Cataract extraction w/PHACO (Right, 05/19/2019); Intrathecal pump implant; Coccyx removal; Cataract extraction w/PHACO (Left, 06/16/2019); Colonoscopy with propofol  (N/A, 12/11/2021); Esophagogastroduodenoscopy (egd) with propofol  (N/A, 12/11/2021); and Total hip arthroplasty (Left). Family: family history includes Arthritis in her paternal grandfather; Cancer  in her paternal grandmother and sister; Cancer (age of onset: 74) in her father; Cancer (age of onset: 55) in her mother; Hyperlipidemia in her sister and son; Hypertension in her sister; Osteoporosis in her maternal grandmother; Seizures in her son; Thyroid  disease in her mother.  Laboratory Chemistry Profile   Renal Lab Results  Component Value Date   BUN 18 01/28/2024   CREATININE 1.06 (H) 01/28/2024   BCR 14 12/31/2023   GFRAA 92 07/26/2020   GFRNONAA 55 (L) 01/28/2024    Hepatic Lab Results  Component Value Date   AST 14 12/18/2023   ALT 9 12/18/2023   ALBUMIN 4.1 12/18/2023   ALKPHOS 92 12/18/2023   AMYLASE 78 03/14/2022   LIPASE 44 03/14/2023    Electrolytes Lab  Results  Component Value Date   NA 139 01/28/2024   K 3.8 01/28/2024   CL 100 01/28/2024   CALCIUM  8.6 (L) 01/28/2024   MG 2.0 08/19/2023   PHOS 3.7 08/19/2023    Bone Lab Results  Component Value Date   VD25OH 56.8 09/16/2023   25OHVITD1 62 06/17/2023   25OHVITD2 <1.0 06/17/2023   25OHVITD3 62 06/17/2023    Inflammation (CRP: Acute Phase) (ESR: Chronic Phase) Lab Results  Component Value Date   CRP <1 06/17/2023   ESRSEDRATE 19 06/17/2023   LATICACIDVEN 1.0 08/10/2023         Note: Above Lab results reviewed.  Recent Imaging Review  DG Chest 1 View CLINICAL DATA:  10026 Shortness of breath 10026  EXAM: CHEST  1 VIEW  COMPARISON:  Chest x-ray 08/13/2023  FINDINGS: The heart and mediastinal contours are within normal limits. Atherosclerotic plaque.  No focal consolidation. Chronic coarsened interstitial markings with no overt pulmonary edema. No pleural effusion. No pneumothorax.  No acute osseous abnormality.  IMPRESSION: 1. No active disease. 2.  Aortic Atherosclerosis (ICD10-I70.0).  Electronically Signed   By: Morgane  Naveau M.D.   On: 01/28/2024 19:51 Note: Reviewed        Physical Exam  General appearance: Well nourished, well developed, and well hydrated. In no apparent acute distress Mental status: Alert, oriented x 3 (person, place, & time)       Vitals: BP 123/71   Pulse 80   Temp 97.9 F (36.6 C)   Ht 4' 10 (1.473 m)   Wt 142 lb (64.4 kg)   LMP  (LMP Unknown)   SpO2 100% Comment: 2 liters  BMI 29.68 kg/m  BMI: Estimated body mass index is 29.68 kg/m as calculated from the following:   Height as of this encounter: 4' 10 (1.473 m).   Weight as of this encounter: 142 lb (64.4 kg). Ideal: Patient must be at least 60 in tall to calculate ideal body weight  Heart: RR&R Lungs: CTA. No Rales or rhonchi. No wheezing.  Patient is on supplemental oxygen  Respiratory: No evidence of acute respiratory distress.  The patient has decreased range  of motion of the cervical spine secondary to torticollis. Psych/Mental status: Alert, oriented x 3 (person, place, & time)       Eyes: PERLA  Assessment   Diagnosis Status  1. End of battery life of intrathecal infusion pump   2. Chronic pain syndrome   3. Chronic low back pain (1ry area of Pain) (Bilateral) (R>L) w/o sciatica   4. Chronic lower extremity pain (2ry area of Pain) (Bilateral) (R>L)   5. Chronic radicular pain of lower extremity   6. Presence of intrathecal pump   7. Chronic anticoagulation (Eliquis )  8. Other secondary scoliosis, cervical region   9. Torticollis   10. Decreased range of motion of intervertebral discs of cervical spine   11. COPD, moderate (HCC)   12. Supplemental oxygen  dependent    Deteriorating Controlled Controlled   Updated Problems: Problem  Decreased Range of Motion of Intervertebral Discs of Cervical Spine  Supplemental Oxygen  Dependent  End of Battery Life of Intrathecal Infusion Pump  Cervical Scoliosis    Plan of Care  Problem-specific:  Assessment and Plan    Neck pain   Neck pain may be worsened by carrying a vest, raising concerns about range of motion and potential intubation difficulties. Evaluate range of motion and consult with the anesthesia team regarding intubation.  Diabetes   Diabetes management requires coordination with anesthesia for medication adjustments on the procedure day. Coordinate with anesthesia for diabetes medication management.  Oxygen  management   Oxygen  levels are well-managed. Clearance from the pulmonologist is documented, and a follow-up appointment is confirmed for tomorrow.       Jordan Chan has a current medication list which includes the following long-term medication(s): albuterol , albuterol , apixaban , atorvastatin , ferrous sulfate , furosemide , gabapentin , insulin  lispro, linaclotide, metformin , metoprolol  succinate, montelukast , pantoprazole , and venlafaxine  xr.  Pharmacotherapy  (Medications Ordered): No orders of the defined types were placed in this encounter.  Orders:  Orders Placed This Encounter  Procedures   Intrathecal Pump, Reservoir/Pump replacement    Standing Status:   Future    Expiration Date:   05/03/2024   Blood Thinner Instructions to Nursing    Always make sure patient has clearance from prescribing physician to stop blood thinners for interventional therapies. If the patient requires a Lovenox -bridge therapy, make sure arrangements are made to institute it with the assistance of the PCP.    Scheduling Instructions:     Have Jordan Chan stop the Eliquis  (Apixaban ) x 3 days prior to procedure or surgery.     Interventional Therapies  Risk Factors  Considerations  Medical Comorbidities:  Eliquis  Anticoagulation: (Stop: 3 days  Restart: 6 hours)  A-Fib  Hx of PE  CHF w/ Reduced EF  Cardiomyopathy  Stage 3a CKD  COPD on O2  GERD  T2IDDM  OSA     Planned  Pending:   Revision and replacement of intrathecal pump reservoir secondary to battery depletion. (Before July 2025)    Under consideration:   Revision and replacement of intrathecal pump reservoir secondary to battery depletion. (Before July 2025)  Intrathecal pump refill and management.   Completed:   Surgery: Intrathecal pump implant #1 (*) by Lonni Charleston, MD    Therapeutic  Palliative (PRN) options:   None established   Completed by other providers:   Surgery: Intrathecal pump replacement (05/27/2017) by [place] Lonni LABOR, [facility]     No follow-ups on file.    Recent Visits Date Type Provider Dept  12/15/23 Procedure visit Tanya Glisson, MD [facility]  Showing recent visits within past 90 days and meeting all other requirements Today's Visits Date Type Provider Dept  02/01/24 Office Visit Tanya Glisson, MD [facility]  Showing today's visits and meeting all other requirements Future  Appointments Date Type Provider Dept  02/22/24 Appointment Tanya Glisson, MD [facility]  Showing future appointments within next 90 days and meeting all other requirements  I discussed the assessment and treatment plan with the patient. The patient was provided an opportunity to ask questions and all were answered. The patient agreed with the  plan and demonstrated an understanding of the instructions.  Patient advised to call back or seek an in-person evaluation if the symptoms or condition worsens.  Duration of encounter: 30 minutes.  Total time on encounter, as per AMA guidelines included both the face-to-face and non-face-to-face time personally spent by the physician and/or other qualified health care professional(s) on the day of the encounter (includes time in activities that require the physician or other qualified health care professional and does not include time in activities normally performed by clinical staff). Physician's time may include the following activities when performed: Preparing to see the patient (e.g., pre-charting review of records, searching for previously ordered imaging, lab work, and nerve conduction tests) Review of prior analgesic pharmacotherapies. Reviewing PMP Interpreting ordered tests (e.g., lab work, imaging, nerve conduction tests) Performing post-procedure evaluations, including interpretation of diagnostic procedures Obtaining and/or reviewing separately obtained history Performing a medically appropriate examination and/or evaluation Counseling and educating the patient/family/caregiver Ordering medications, tests, or procedures Referring and communicating with other health care professionals (when not separately reported) Documenting clinical information in the electronic or other health record Independently interpreting results (not separately reported) and communicating results to the patient/ family/caregiver Care coordination (not  separately reported)  Note by: Jordan DELENA Como, MD (TTS and AI technology used. I apologize for any typographical errors that were not detected and corrected.) Date: 02/01/2024; Time: 12:39 PM

## 2024-02-01 NOTE — Progress Notes (Signed)
 PROVIDER NOTE: Interpretation of information contained herein should be left to medically-trained personnel. Specific patient instructions are provided elsewhere under Patient Instructions section of medical record. This document was created in part using AI and STT-dictation technology, any transcriptional errors that may result from this process are unintentional.  Patient: Jordan Chan  Service: E/M   PCP: [name], NP  DOB: Jan 18, 1951  DOS: 02/01/2024  Provider: Eric DELENA Como, MD  MRN: [id number]  Delivery: Face-to-face  Specialty: Interventional Pain Management  Type: Established Patient  Setting: Ambulatory outpatient facility  Specialty designation: 09  Referring Prov.: [name], NP  Location: Outpatient office facility       History of present illness (HPI) Jordan Chan, a 73 y.o. year old female, is here today because of her End of battery life of intrathecal infusion pump [Z46.2]. Jordan Chan primary complain today is Back Pain (lower)  Pertinent problems: Jordan Chan has History of breast cancer; Diabetic peripheral neuropathy (HCC); Torticollis; Cervical scoliosis; Chronic low back pain (1ry area of Pain) (Bilateral) (R>L) w/o sciatica; Chronic pain syndrome; History of sarcoma; Presence of intrathecal pump; End of battery life of intrathecal infusion pump; Status post total hip replacement, left; History of radiation therapy; History of antineoplastic chemotherapy; Chronic hip pain (Left); Abnormal MRI, lumbar spine (08/26/2019); Chronic lower extremity pain (2ry area of Pain) (Bilateral) (R>L); Chronic neck pain (3ry area of Pain) (Bilateral) (L>R); Cervicalgia; Abnormal x-ray of cervical spine (07/04/2023); DDD (degenerative disc disease), lumbosacral w/ LBP & LEP; Chronic radicular pain of lower extremity; Encounter for adjustment and management of infusion pump; Decreased range of motion of intervertebral discs of cervical spine; and Supplemental  oxygen  dependent on their pertinent problem list.  Pain Assessment: Severity of Chronic pain is reported as a 4 /10. Location: Back  /Denies. Onset: More than a month ago. Quality: Aching, Burning. Timing: Constant. Modifying factor(s): rest, pain pump. Vitals:  height is 4' 10 (1.473 m) and weight is 142 lb (64.4 kg). Her temperature is 97.9 F (36.6 C). Her blood pressure is 123/71 and her pulse is 80. Her oxygen  saturation is 100%.  BMI: Estimated body mass index is 29.68 kg/m as calculated from the following:   Height as of this encounter: 4' 10 (1.473 m).   Weight as of this encounter: 142 lb (64.4 kg).  Last encounter: 06/17/2023. Last procedure: 12/15/2023.  Reason for encounter: evaluation for possible interventional PM therapy/treatment.  The patient comes into the clinic today for a preoperative eval evaluation prior to intrathecal pump replacement secondary to end of battery life.  Discussed the use of AI scribe software for clinical note transcription with the patient, who gave verbal consent to proceed.  History of Present Illness   Jordan Chan is a 73 year old female who presents for pre-operative evaluation prior to pump replacement surgery.  She is scheduled for pump replacement surgery on July 10th, which involves removing the old pump and replacing it with a new one.  She has diabetes, requiring coordination with anesthesia for medication management on the day of surgery. Her diabetes management is a critical aspect of her pre-operative preparation.  She is concerned about her oxygen  levels due to her history of chronic lung disease. Her oxygen  levels have been stable, and she has received clearance from her pulmonologist, whom she will see again tomorrow for further evaluation.  She experiences neck pain, which she attributes to carrying a vest, and has issues with range of motion in her  neck. This condition may impact her intubation during anesthesia.       Pharmacotherapy Assessment   Analgesic: morphine  sulfate powder (for intrathecal pump use)  MME/day: 2.5 mg/day (Intrathecal)   Monitoring: [place] PMP: PDMP reviewed during this encounter.       Pharmacotherapy: No side-effects or adverse reactions reported. Compliance: No problems identified. Effectiveness: Clinically acceptable.  Delores Dorothe LABOR, RN  02/01/2024  9:48 AM  Sign when Signing Visit Safety precautions to be maintained throughout the outpatient stay will include: orient to surroundings, keep bed in low position, maintain call bell within reach at all times, provide assistance with transfer out of bed and ambulation.     UDS:  Summary  Date Value Ref Range Status  06/17/2023 Note  Final    Comment:    ==================================================================== Compliance Drug Analysis, Ur ==================================================================== Test                             Result       Flag       Units  Drug Present and Declared for Prescription Verification   Gabapentin                      PRESENT      EXPECTED   Venlafaxine                     PRESENT      EXPECTED   Desmethylvenlafaxine           PRESENT      EXPECTED    Desmethylvenlafaxine is an expected metabolite of venlafaxine .    Metoprolol                      PRESENT      EXPECTED  Drug Present not Declared for Prescription Verification   Morphine                        1930         UNEXPECTED ng/mg creat    Potential sources of morphine  include administration of codeine or    morphine , use of heroin, or ingestion of poppy seeds.  Drug Absent but Declared for Prescription Verification   Hydrocodone                     Not Detected UNEXPECTED ng/mg creat   Cyclobenzaprine                 Not Detected UNEXPECTED   Acetaminophen                   Not Detected UNEXPECTED    Acetaminophen , as indicated in the declared medication list, is not    always detected even when used as  directed.  ==================================================================== Test                      Result    Flag   Units      Ref Range   Creatinine              97               mg/dL      >=79 ==================================================================== Declared Medications:  The flagging and interpretation on this report are based on the  following declared medications.  Unexpected results may arise from  inaccuracies in the declared medications.   **Note: The  testing scope of this panel includes these medications:   Cyclobenzaprine  (Flexeril )  Gabapentin   Hydrocodone  (Norco)  Metoprolol  (Toprol )  Venlafaxine  (Effexor )   **Note: The testing scope of this panel does not include small to  moderate amounts of these reported medications:   Acetaminophen  (Norco)   **Note: The testing scope of this panel does not include the  following reported medications:   Albuterol   Apixaban  (Eliquis )  Atorvastatin  (Lipitor)  Buspirone  (Buspar )  Dulaglutide  (Trulicity )  Fluticasone  (Trelegy)  Folic Acid   Furosemide  (Lasix )  Insulin  (Tresiba )  Losartan  (Cozaar )  Metformin   Montelukast   Ondansetron  (Zofran )  Pantoprazole  (Protonix )  Umeclidinium (Trelegy)  Vilanterol (Trelegy)  Vitamin B12  Vitamin D3 ==================================================================== For clinical consultation, please call [phone]. ====================================================================     No results found for: CBDTHCR No results found for: D8THCCBX No results found for: D9THCCBX  ROS  Constitutional: Denies any fever or chills Gastrointestinal: No reported hemesis, hematochezia, vomiting, or acute GI distress Musculoskeletal: Denies any acute onset joint swelling, redness, loss of ROM, or weakness Neurological: No reported episodes of acute onset apraxia, aphasia, dysarthria, agnosia, amnesia, paralysis, loss of coordination, or loss of  consciousness  Medication Review  Accu-Chek Softclix Lancets, Cyanocobalamin , Dulaglutide , Easy Passenger transport manager, Fluticasone -Umeclidin-Vilant, FreeStyle Libre 2 Reader, [employer] 2 Sensor, HYDROcodone -acetaminophen , Oxygen -Helium, PAIN MANAGEMENT INTRATHECAL (IT) PUMP, True Metrix Meter, albuterol , alum & mag hydroxide-simeth, apixaban , atorvastatin , busPIRone , cholecalciferol , cyclobenzaprine , dapagliflozin  propanediol, docusate sodium , ferrous sulfate , folic acid , furosemide , gabapentin , glucose blood, insulin  degludec, insulin  lispro, linaclotide, metFORMIN , metoprolol  succinate, montelukast , naloxone , ondansetron , pantoprazole , polyethylene glycol, sacubitril-valsartan, and venlafaxine  XR  History Review  Allergy: Jordan Chan is allergic to other, pain patch [menthol], jardiance  [empagliflozin ], prednisone , avelox  [moxifloxacin  hcl in nacl], doxycycline , erythromycin , fentanyl , moxifloxacin  hcl, oxycontin [oxycodone], and ozempic [semaglutide]. Drug: Jordan Chan  reports no history of drug use. Alcohol :  reports no history of alcohol  use. Tobacco:  reports that she quit smoking about 19 years ago. Her smoking use included cigarettes. She started smoking about 49 years ago. She has a 30 pack-year smoking history. She has been exposed to tobacco smoke. She has never used smokeless tobacco. Social: Jordan Chan  reports that she quit smoking about 19 years ago. Her smoking use included cigarettes. She started smoking about 49 years ago. She has a 30 pack-year smoking history. She has been exposed to tobacco smoke. She has never used smokeless tobacco. She reports that she does not drink alcohol  and does not use drugs. Medical:  has a past medical history of Arthritis, Asthma, Atrial fibrillation (HCC), Breast cancer (HCC) (1998), Bronchitis, CHF (congestive heart failure) (HCC), COPD (chronic obstructive pulmonary disease) (HCC), Diabetes mellitus without complication (HCC),  Diverticulitis, Dyspnea, Endometriosis, GERD (gastroesophageal reflux disease), History of shingles (2000-2005), Hypercholesteremia, Hypertension, IBS (irritable bowel syndrome), Low back pain, Neuropathy, Orthopnea, Oxygen  dependent, Personal history of chemotherapy, Personal history of radiation therapy, Pneumonia, Scoliosis, Sleep apnea, Stroke (HCC) (2010), and 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 Surgical: Jordan Chan  has a past surgical history that includes Abdominal hysterectomy (1987); Elbow arthroscopy with tendon reconstruction; morphine  pump (2011); Plantar fascia release; Esophagogastroduodenoscopy (egd) with propofol  (N/A, 01/01/2017); Colonoscopy with propofol  (N/A, 01/01/2017); LEFT HEART CATH AND CORONARY ANGIOGRAPHY (N/A, 04/28/2017); Mastectomy (Right, 06/1997); Back surgery; Breast surgery (Right); Cardiac catheterization; Trigger finger release; Cataract extraction w/PHACO (Right, 05/19/2019); Intrathecal pump implant; Coccyx removal; Cataract extraction w/PHACO (Left, 06/16/2019); Colonoscopy with propofol  (N/A, 12/11/2021); Esophagogastroduodenoscopy (egd) with propofol  (N/A, 12/11/2021); and Total hip arthroplasty (Left). Family: family history includes Arthritis in her paternal grandfather; Cancer  in her paternal grandmother and sister; Cancer (age of onset: 74) in her father; Cancer (age of onset: 55) in her mother; Hyperlipidemia in her sister and son; Hypertension in her sister; Osteoporosis in her maternal grandmother; Seizures in her son; Thyroid  disease in her mother.  Laboratory Chemistry Profile   Renal Lab Results  Component Value Date   BUN 18 01/28/2024   CREATININE 1.06 (H) 01/28/2024   BCR 14 12/31/2023   GFRAA 92 07/26/2020   GFRNONAA 55 (L) 01/28/2024    Hepatic Lab Results  Component Value Date   AST 14 12/18/2023   ALT 9 12/18/2023   ALBUMIN 4.1 12/18/2023   ALKPHOS 92 12/18/2023   AMYLASE 78 03/14/2022   LIPASE 44 03/14/2023    Electrolytes Lab  Results  Component Value Date   NA 139 01/28/2024   K 3.8 01/28/2024   CL 100 01/28/2024   CALCIUM  8.6 (L) 01/28/2024   MG 2.0 08/19/2023   PHOS 3.7 08/19/2023    Bone Lab Results  Component Value Date   VD25OH 56.8 09/16/2023   25OHVITD1 62 06/17/2023   25OHVITD2 <1.0 06/17/2023   25OHVITD3 62 06/17/2023    Inflammation (CRP: Acute Phase) (ESR: Chronic Phase) Lab Results  Component Value Date   CRP <1 06/17/2023   ESRSEDRATE 19 06/17/2023   LATICACIDVEN 1.0 08/10/2023         Note: Above Lab results reviewed.  Recent Imaging Review  DG Chest 1 View CLINICAL DATA:  10026 Shortness of breath 10026  EXAM: CHEST  1 VIEW  COMPARISON:  Chest x-ray 08/13/2023  FINDINGS: The heart and mediastinal contours are within normal limits. Atherosclerotic plaque.  No focal consolidation. Chronic coarsened interstitial markings with no overt pulmonary edema. No pleural effusion. No pneumothorax.  No acute osseous abnormality.  IMPRESSION: 1. No active disease. 2.  Aortic Atherosclerosis (ICD10-I70.0).  Electronically Signed   By: Morgane  Naveau M.D.   On: 01/28/2024 19:51 Note: Reviewed        Physical Exam  General appearance: Well nourished, well developed, and well hydrated. In no apparent acute distress Mental status: Alert, oriented x 3 (person, place, & time)       Vitals: BP 123/71   Pulse 80   Temp 97.9 F (36.6 C)   Ht 4' 10 (1.473 m)   Wt 142 lb (64.4 kg)   LMP  (LMP Unknown)   SpO2 100% Comment: 2 liters  BMI 29.68 kg/m  BMI: Estimated body mass index is 29.68 kg/m as calculated from the following:   Height as of this encounter: 4' 10 (1.473 m).   Weight as of this encounter: 142 lb (64.4 kg). Ideal: Patient must be at least 60 in tall to calculate ideal body weight  Heart: RR&R Lungs: CTA. No Rales or rhonchi. No wheezing.  Patient is on supplemental oxygen  Respiratory: No evidence of acute respiratory distress.  The patient has decreased range  of motion of the cervical spine secondary to torticollis. Psych/Mental status: Alert, oriented x 3 (person, place, & time)       Eyes: PERLA  Assessment   Diagnosis Status  1. End of battery life of intrathecal infusion pump   2. Chronic pain syndrome   3. Chronic low back pain (1ry area of Pain) (Bilateral) (R>L) w/o sciatica   4. Chronic lower extremity pain (2ry area of Pain) (Bilateral) (R>L)   5. Chronic radicular pain of lower extremity   6. Presence of intrathecal pump   7. Chronic anticoagulation (Eliquis )  8. Other secondary scoliosis, cervical region   9. Torticollis   10. Decreased range of motion of intervertebral discs of cervical spine   11. COPD, moderate (HCC)   12. Supplemental oxygen  dependent    Deteriorating Controlled Controlled   Updated Problems: Problem  Decreased Range of Motion of Intervertebral Discs of Cervical Spine  Supplemental Oxygen  Dependent  End of Battery Life of Intrathecal Infusion Pump  Cervical Scoliosis    Plan of Care  Problem-specific:  Assessment and Plan    Neck pain   Neck pain may be worsened by carrying a vest, raising concerns about range of motion and potential intubation difficulties. Evaluate range of motion and consult with the anesthesia team regarding intubation.  Diabetes   Diabetes management requires coordination with anesthesia for medication adjustments on the procedure day. Coordinate with anesthesia for diabetes medication management.  Oxygen  management   Oxygen  levels are well-managed. Clearance from the pulmonologist is documented, and a follow-up appointment is confirmed for tomorrow.       Jordan Chan has a current medication list which includes the following long-term medication(s): albuterol , albuterol , apixaban , atorvastatin , ferrous sulfate , furosemide , gabapentin , insulin  lispro, linaclotide, metformin , metoprolol  succinate, montelukast , pantoprazole , and venlafaxine  xr.  Pharmacotherapy  (Medications Ordered): No orders of the defined types were placed in this encounter.  Orders:  Orders Placed This Encounter  Procedures   Intrathecal Pump, Reservoir/Pump replacement    Standing Status:   Future    Expiration Date:   05/03/2024   Blood Thinner Instructions to Nursing    Always make sure patient has clearance from prescribing physician to stop blood thinners for interventional therapies. If the patient requires a Lovenox -bridge therapy, make sure arrangements are made to institute it with the assistance of the PCP.    Scheduling Instructions:     Have Jordan Chan stop the Eliquis  (Apixaban ) x 3 days prior to procedure or surgery.     Interventional Therapies  Risk Factors  Considerations  Medical Comorbidities:  Eliquis  Anticoagulation: (Stop: 3 days  Restart: 6 hours)  A-Fib  Hx of PE  CHF w/ Reduced EF  Cardiomyopathy  Stage 3a CKD  COPD on O2  GERD  T2IDDM  OSA     Planned  Pending:   Revision and replacement of intrathecal pump reservoir secondary to battery depletion. (Before July 2025)    Under consideration:   Revision and replacement of intrathecal pump reservoir secondary to battery depletion. (Before July 2025)  Intrathecal pump refill and management.   Completed:   Surgery: Intrathecal pump implant #1 (*) by Lonni Charleston, MD    Therapeutic  Palliative (PRN) options:   None established   Completed by other providers:   Surgery: Intrathecal pump replacement (05/27/2017) by [place] Lonni LABOR, MD ( [facility])     No follow-ups on file.    Recent Visits Date Type Provider Dept  12/15/23 Procedure visit Tanya Glisson, MD [facility]  Showing recent visits within past 90 days and meeting all other requirements Today's Visits Date Type Provider Dept  02/01/24 Office Visit Tanya Glisson, MD [facility]  Showing today's visits and meeting all other requirements Future  Appointments Date Type Provider Dept  02/22/24 Appointment Tanya Glisson, MD [facility]  Showing future appointments within next 90 days and meeting all other requirements  I discussed the assessment and treatment plan with the patient. The patient was provided an opportunity to ask questions and all were answered. The patient agreed with the  plan and demonstrated an understanding of the instructions.  Patient advised to call back or seek an in-person evaluation if the symptoms or condition worsens.  Duration of encounter: 30 minutes.  Total time on encounter, as per AMA guidelines included both the face-to-face and non-face-to-face time personally spent by the physician and/or other qualified health care professional(s) on the day of the encounter (includes time in activities that require the physician or other qualified health care professional and does not include time in activities normally performed by clinical staff). Physician's time may include the following activities when performed: Preparing to see the patient (e.g., pre-charting review of records, searching for previously ordered imaging, lab work, and nerve conduction tests) Review of prior analgesic pharmacotherapies. Reviewing PMP Interpreting ordered tests (e.g., lab work, imaging, nerve conduction tests) Performing post-procedure evaluations, including interpretation of diagnostic procedures Obtaining and/or reviewing separately obtained history Performing a medically appropriate examination and/or evaluation Counseling and educating the patient/family/caregiver Ordering medications, tests, or procedures Referring and communicating with other health care professionals (when not separately reported) Documenting clinical information in the electronic or other health record Independently interpreting results (not separately reported) and communicating results to the patient/ family/caregiver Care coordination (not  separately reported)  Note by: Eric DELENA Como, MD (TTS and AI technology used. I apologize for any typographical errors that were not detected and corrected.) Date: 02/01/2024; Time: 12:39 PM

## 2024-02-02 NOTE — Progress Notes (Signed)
 Follow-up, COPD, and Sleep Apnea   History of Present Illness: Jordan Chan is a 73 y.o. female presents to clinic for follow up. Breathing  pretty good on oxygen . No new complaints. Bright affect.    Problem List:  Patient Active Problem List  Diagnosis  . COPD, moderate (CMS/HHS-HCC)  . Nocturnal oxygen  desaturation  . Vitamin D  deficiency  . Type 2 diabetes mellitus with diabetic neuropathy, unspecified (CMS/HHS-HCC)  . Takotsubo syndrome  . SVT (supraventricular tachycardia) (CMS/HHS-HCC)  . Sleep apnea  . Sarcoma, endometrial stromal (CMS/HHS-HCC)  . Presence of intrathecal pump  . Obesity  . Neuralgia of chest  . Chronic pain syndrome  . Insomnia  . Hypertension associated with diabetes (CMS/HHS-HCC)  . Hyperlipidemia associated with type 2 diabetes mellitus (CMS/HHS-HCC)  . History of non-ST elevation myocardial infarction (NSTEMI)  . History of breast cancer  . GERD (gastroesophageal reflux disease)  . End of battery life of intrathecal infusion pump  . Depression, major, single episode, in partial remission ()  . Community acquired pneumonia  . Cervical scoliosis  . B12 deficiency  . Aphthous ulcer of tongue  . Allergic rhinitis  . Advanced care planning/counseling discussion  . Acute on chronic respiratory failure with hypoxia (CMS/HHS-HCC)    Current Medications:  Current Outpatient Medications  Medication Sig Dispense Refill  . ACCU-CHEK AVIVA PLUS TEST STRP test strip     . albuterol  MDI, PROVENTIL , VENTOLIN , PROAIR , HFA 90 mcg/actuation inhaler INHALE 2 PUFFS EVERY 6 HOURS AS NEEDED FOR WHEEZING 18 g 3  . apixaban  (ELIQUIS ) 5 mg tablet Take 1 tablet (5 mg total) by mouth every 12 (twelve) hours 60 tablet 4  . aspirin  81 MG EC tablet Take 81 mg by mouth once daily    . atorvastatin  (LIPITOR) 20 MG tablet Take by mouth.    . BD ALCOHOL  SWABS PadM     . cholecalciferol  (VITAMIN D3) 1000 unit tablet Take by mouth    . cyclobenzaprine  (FLEXERIL ) 5 MG  tablet Take 1 tablet (5 mg total) by mouth 3 (three) times daily as needed 60 tablet 1  . diltiazem  (TIAZAC ) 180 MG ER capsule Take by mouth    . FUROsemide  (LASIX ) 40 MG tablet furosemide  40 mg tablet    . gabapentin  (NEURONTIN ) 800 MG tablet Take 400 mg by mouth 3 (three) times a day    . lancets (ACCU-CHEK SOFTCLIX LANCETS) TEST BLOOD SUGAR THREE TIMES DAILY    . lisinopril  (PRINIVIL ,ZESTRIL ) 5 MG tablet Take 5 mg by mouth once daily.    . metFORMIN  (GLUCOPHAGE ) 1000 MG tablet Take 1,000 mg by mouth 2 (two) times daily    . metoprolol  succinate (TOPROL -XL) 25 MG XL tablet Take 0.5 tablets by mouth once daily.    . montelukast  (SINGULAIR ) 10 mg tablet Take 1 tablet (10 mg total) by mouth nightly 30 tablet 3  . pantoprazole  (PROTONIX ) 40 MG DR tablet Take by mouth 2 (two) times daily.    . sennosides-docusate (SENOKOT-S) 8.6-50 mg tablet Take by mouth 2 (two) times daily As needed      . TRELEGY ELLIPTA  100-62.5-25 mcg inhaler Inhale 1 inhalation into the lungs once daily    . TRESIBA  FLEXTOUCH U-100 pen injector (concentration 100 units/mL) nightly    . TRULICITY  0.75 mg/0.5 mL PnIj once a week    . venlafaxine  (EFFEXOR -XR) 150 MG XR capsule Take 150 mg by mouth once daily.    . predniSONE  (DELTASONE ) 10 MG tablet Prednisone  10 mg, 4 pills q day x  2 days, 3 pills q day x 2 days, 2 pills q day x 2 days, 1 pill q day x 2 days. (Patient not taking: Reported on 02/02/2024) 20 tablet 0   No current facility-administered medications for this visit.    History: Past Medical History:  Diagnosis Date  . Arthritis   . Asthma without status asthmaticus (HHS-HCC)   . Cancer (CMS/HHS-HCC)    breast  . COPD (chronic obstructive pulmonary disease) (CMS/HHS-HCC)   . Depression   . Diabetes mellitus type 2, uncomplicated (CMS/HHS-HCC)    non-insulin  dependent  . GERD (gastroesophageal reflux disease)     Past Surgical History:  Procedure Laterality Date  . COLONOSCOPY  01/13/2007  . CATARACT  EXTRACTION  05/2019 & 06/2019  . Excision coccyx    . HYSTERECTOMY    . MASTECTOMY    . Morphine  pump implant    . Plantar fascial release right foot.    . Trigger joint release right hand.      Family History  Problem Relation Name Age of Onset  . Lung cancer Mother    . Anemia Mother    . Pancreatic cancer Father       reports that she quit smoking about 20 years ago. Her smoking use included cigarettes. She started smoking about 57 years ago. She has a 55.5 pack-year smoking history. She has never used smokeless tobacco. She reports that she does not drink alcohol  and does not use drugs.   Review of Systems: As per above. All systems were reviewed with her in totality and there were no further positive findings. Presently no bleeding or bruising. No dysuria, hematuria, fever, chills, nor sweats. No lymph nodes, no rashes, TIAs, seizures, passing out spells, or suicidal ideation. No unstable angina.   Physical Exam: BP 127/72   Pulse 91   Ht 147.3 cm (4' 10)   Wt 64.4 kg (142 lb)   SpO2 97%   BMI 29.68 kg/m  64.4 kg (142 lb) 97% General:  NAD. Able to speak in complete sentences without cough or dyspnea HEENT: Normocephalic, nontraumatic. Extraocular movements intact NECK: Supple. No JVD, nodes, thryomegaly CV: RRR no murmurs, gallops, rubs PULM: Normal respiratory effort, Clear to auscultation bilaterally without wheezing or crackles EXTREMITIES: No significant edema, cyanosis or Homans'signs SKIN: Fair turgor. No rashes LYMPHATIC: No nodes NEURO: No gross deficits PSYCH: Bright  affect, alert, oriented   IMPRESSION: cta  1. Study is positive for small subsegmental sized pulmonary emboli  in the lungs bilaterally.  2. Severe multilobar bilateral pneumonia predominantly dependently  located in the lungs bilaterally, concerning for aspiration  pneumonia.  3. Small bilateral parapneumonic pleural effusions lying  dependently, simple in appearance at this time.  4.  Extensive periportal edema in the liver. This is a nonspecific  finding. Correlation with liver function tests is recommended.  5. Colonic diverticulosis without evidence of acute diverticulitis  at this time.  6. Aortic atherosclerosis, in addition to left main and circumflex  coronary artery disease. Assessment for potential risk factor  modification, dietary therapy or pharmacologic therapy may be  warranted, if clinically indicated.  7. Additional incidental findings, as above.    Electronically Signed    By: Toribio Aye M.D.    On: 08/09/2023 09:01    IMPRESSIONS echo, 08/11/23  1. Left ventricular ejection fraction, by estimation, is 25 to 30%. The left ventricle has severely decreased function. The left ventricle demonstrates global hypokinesis. Left ventricular diastolic parameters are consistent with Grade I  diastolic  dysfunction (impaired relaxation).   2. Right ventricular systolic function is low normal. The right ventricular size is normal. Tricuspid regurgitation signal is inadequate for assessing PA pressure.   3. The mitral valve is normal in structure. Trivial mitral valve regurgitation. No evidence of mitral stenosis.   4. The aortic valve was not well visualized. Aortic valve regurgitation is not visualized. No aortic stenosis is present.    IMPRESSION: cxr 1. Slightly increased densities at both lung bases. Findings are  concerning for pleural fluid and atelectasis/consolidation.  2. Persistent airspace disease in the right infrahilar region.  3. Support apparatuses as described.   Electronically Signed    By: Juliene Balder M.D.    On: 08/13/2023 08:03    INDICATION: SOB (shortness of breath) [R06.02 (ICD-10-CM)] Pneumonia of both lungs due to infectious organism, unspecified part of lung [J18.9 (ICD-10-CM)] Two-view chest 07/16/2020   TECHNIQUE AND FINDINGS:  2 view chest   Mild area of increased density within the posterior lateral left lung base.  No  focal areas of consolidation.  Prominent interstitial markings..  Cardiac silhouette, osseous structures and soft tissues are unremarkable.     IMPRESSION:  3/25 Mild and/or resolving infiltrate left ower lobe. HECTOR LEMOND FELL, MD   Impression: COPD, moderate, on oxygen , ex smoker, EF 45-50-%, chronic dyspnea with activity, phx of pulm embolism on eliquis  for afib. no bleeding.  08/09/23 she was admitted to [facility] acute on chronic respiratory failure. ( [facility], bilateral pneumonia and subsegmental emboli). Was intubated x 7 days.  Since then holding her own on present regimen. Will stay the course Continue albuterol /trelegy, singulair  10 mg q hs ( working for her)  Eliquis  5 mg q 12 hrs Watch weight  Noct o2 at 2 liters and 2 liters with activity F/u in 2 month with cxr   Of to have her morphine  pump replaced

## 2024-02-03 ENCOUNTER — Telehealth: Payer: Self-pay | Admitting: *Deleted

## 2024-02-03 ENCOUNTER — Other Ambulatory Visit: Payer: Self-pay

## 2024-02-03 ENCOUNTER — Encounter
Admission: RE | Admit: 2024-02-03 | Discharge: 2024-02-03 | Disposition: A | Discharge status: Home / Self Care | Source: Ambulatory Visit | Attending: Pain Medicine | Admitting: Pain Medicine

## 2024-02-03 ENCOUNTER — Telehealth: Payer: Self-pay

## 2024-02-03 DIAGNOSIS — E119 Type 2 diabetes mellitus without complications: Secondary | ICD-10-CM

## 2024-02-03 HISTORY — DX: Other complications of anesthesia, initial encounter: T88.59XA

## 2024-02-03 HISTORY — DX: Chronic kidney disease, unspecified: N18.9

## 2024-02-03 HISTORY — DX: Anemia, unspecified: D64.9

## 2024-02-03 HISTORY — DX: Depression, unspecified: F32.A

## 2024-02-03 HISTORY — DX: Cardiac arrhythmia, unspecified: I49.9

## 2024-02-03 HISTORY — DX: Atherosclerosis of aorta: I70.0

## 2024-02-03 HISTORY — DX: Anxiety disorder, unspecified: F41.9

## 2024-02-03 NOTE — Telephone Encounter (Signed)
 Pharmacy please advise on holding apixaban  prior to  PAIN PUMP REVISION  scheduled for 02/11/2024. Thank you.

## 2024-02-03 NOTE — Patient Instructions (Addendum)
 Your procedure is scheduled on: 02/11/24 - Thursday Report to the Registration Desk on the 1st floor of the [facility]. To find out your arrival time, please call [phone] between 1PM - 3PM on: 02/10/24 - Wednesday If your arrival time is 6:00 am, do not arrive before that time as the [facility] entrance doors do not open until 6:00 am.  REMEMBER: Instructions that are not followed completely may result in serious medical risk, up to and including death; or upon the discretion of your surgeon and anesthesiologist your surgery may need to be rescheduled.  Do not eat food after midnight the night before surgery.  No gum chewing or hard candies.  You may however, drink CLEAR liquids up to 2 hours before you are scheduled to arrive for your surgery. Do not drink anything within 2 hours of your scheduled arrival time.  Clear liquids include: - water    One week prior to surgery: Stop Anti-inflammatories (NSAIDS) such as Advil, Aleve, Ibuprofen, Motrin, Naproxen, Naprosyn and Aspirin  based products such as Excedrin, Goody's Powder, BC Powder. You may take Tylenol  if needed for pain up until the day of surgery.  Stop ANY OVER THE COUNTER supplements until after surgery : Folic Acid  and Vitamin D3  HOLD apixaban  (ELIQUIS ) beginning 02/08/24, may resume with doctors instructions.  HOLD FARXIGA  beginning 02/08/24, may resume after surgery  HOLD TRULICITY  7 days prior to your surgery  HOLD insulin  lispro (HUMALOG ) on the morning of surgery, may resume with meals.  HOLD metFORMIN  (GLUCOPHAGE ) beginning 02/09/24, may resume after surgery.  TRESIBA  FLEXTOUCH - inject only 1/2 of your bedtime dose on the night before your surgery.  ON THE DAY OF SURGERY ONLY TAKE THESE MEDICATIONS WITH SIPS OF WATER :  albuterol  (PROVENTIL ) (2.5 MG/3ML) 0.083% nebulizer  busPIRone  (BUSPAR )  gabapentin  (NEURONTIN )  pantoprazole  (PROTONIX )  TRELEGY ELLIPTA     Use inhalers albuterol  (VENTOLIN  HFA)  on  the day of surgery and bring to the hospital.  No Alcohol  for 24 hours before or after surgery.  No Smoking including e-cigarettes for 24 hours before surgery.  No chewable tobacco products for at least 6 hours before surgery.  No nicotine patches on the day of surgery.  Do not use any recreational drugs for at least a week (preferably 2 weeks) before your surgery.  Please be advised that the combination of cocaine and anesthesia may have negative outcomes, up to and including death. If you test positive for cocaine, your surgery will be cancelled.  On the morning of surgery brush your teeth with toothpaste and water , you may rinse your mouth with mouthwash if you wish. Do not swallow any toothpaste or mouthwash.  Do not wear jewelry, make-up, hairpins, clips or nail polish.  For welded (permanent) jewelry: bracelets, anklets, waist bands, etc.  Please have this removed prior to surgery.  If it is not removed, there is a chance that hospital personnel will need to cut it off on the day of surgery.  Do not wear lotions, powders, or perfumes.   Do not shave body hair from the neck down 48 hours before surgery.  Contact lenses, hearing aids and dentures may not be worn into surgery.  Do not bring valuables to the hospital. [facility] is not responsible for any missing/lost belongings or valuables.   Notify your doctor if there is any change in your medical condition (cold, fever, infection).  Wear comfortable clothing (specific to your surgery type) to the hospital.  After surgery, you can help  prevent lung complications by doing breathing exercises.  Take deep breaths and cough every 1-2 hours. Your doctor may order a device called an Incentive Spirometer to help you take deep breaths.  When coughing or sneezing, hold a pillow firmly against your incision with both hands. This is called "splinting." Doing this helps protect your incision. It also decreases belly discomfort.  If  you are being admitted to the hospital overnight, leave your suitcase in the car. After surgery it may be brought to your room.  In case of increased patient census, it may be necessary for you, the patient, to continue your postoperative care in the Same Day Surgery department.  If you are being discharged the day of surgery, you will not be allowed to drive home. You will need a responsible individual to drive you home and stay with you for 24 hours after surgery.   If you are taking public transportation, you will need to have a responsible individual with you.  Please call the Pre-admissions Testing Dept. at [phone] if you have any questions about these instructions.  Surgery Visitation Policy:  Patients having surgery or a procedure may have two visitors.  Children under the age of 63 must have an adult with them who is not the patient.  Inpatient Visitation:    Visiting hours are 7 a.m. to 8 p.m. Up to four visitors are allowed at one time in a patient room. The visitors may rotate out with other people during the day.  One visitor age 49 or older may stay with the patient overnight and must be in the room by 8 p.m.   Merchandiser, retail to address health-related social needs:  [email or web address]  Preparing the Skin Before Surgery     To help prevent the risk of infection at your surgical site, we are now providing you with rinse-free Sage 2% Chlorhexidine  Gluconate (CHG) disposable wipes.  Chlorhexidine  Gluconate (CHG) Soap  o An antiseptic cleaner that kills germs and bonds with the skin to continue killing germs even after washing  o Used for showering the night before surgery and morning of surgery  The night before surgery: Shower or bathe with warm water . Do not apply perfume, lotions, powders. Wait one hour after shower. Skin should be dry and cool. Open Sage wipe package - use 6 disposable cloths. Wipe body using one cloth for the  right arm, one cloth for the left arm, one cloth for the right leg, one cloth for the left leg, one cloth for the chest/abdomen area, and one cloth for the back. Do not use on open wounds or sores. Do not use on face or genitals (private parts). If you are breast feeding, do not use on breasts. 5. Do not rinse, allow to dry. 6. Skin may feel tacky for several minutes. 7. Dress in clean clothes. 8. Place clean sheets on your bed and do not sleep with pets.  REPEAT ABOVE ON THE MORNING OF SURGERY BEFORE ARRIVING TO THE HOSPITAL.

## 2024-02-03 NOTE — Telephone Encounter (Signed)
 Med Rec and Consent done    Patient Consent for Virtual Visit        Jordan Chan has provided verbal consent on 02/03/2024 for a virtual visit (video or telephone).   CONSENT FOR VIRTUAL VISIT FOR:  Jordan Chan  By participating in this virtual visit I agree to the following:  I hereby voluntarily request, consent and authorize [facility] and its employed or contracted physicians, physician assistants, nurse practitioners or other licensed health care professionals (the Practitioner), to provide me with telemedicine health care services (the "Services) as deemed necessary by the treating Practitioner. I acknowledge and consent to receive the Services by the Practitioner via telemedicine. I understand that the telemedicine visit will involve communicating with the Practitioner through live audiovisual communication technology and the disclosure of certain medical information by electronic transmission. I acknowledge that I have been given the opportunity to request an in-person assessment or other available alternative prior to the telemedicine visit and am voluntarily participating in the telemedicine visit.  I understand that I have the right to withhold or withdraw my consent to the use of telemedicine in the course of my care at any time, without affecting my right to future care or treatment, and that the Practitioner or I may terminate the telemedicine visit at any time. I understand that I have the right to inspect all information obtained and/or recorded in the course of the telemedicine visit and may receive copies of available information for a reasonable fee.  I understand that some of the potential risks of receiving the Services via telemedicine include:  Delay or interruption in medical evaluation due to technological equipment failure or disruption; Information transmitted may not be sufficient (e.g. poor resolution of images) to allow for appropriate medical  decision making by the Practitioner; and/or  In rare instances, security protocols could fail, causing a breach of personal health information.  Furthermore, I acknowledge that it is my responsibility to provide information about my medical history, conditions and care that is complete and accurate to the best of my ability. I acknowledge that Practitioner's advice, recommendations, and/or decision may be based on factors not within their control, such as incomplete or inaccurate data provided by me or distortions of diagnostic images or specimens that may result from electronic transmissions. I understand that the practice of medicine is not an exact science and that Practitioner makes no warranties or guarantees regarding treatment outcomes. I acknowledge that a copy of this consent can be made available to me via my patient portal [facility] MyChart), or I can request a printed copy by calling the office of [facility].    I understand that my insurance will be billed for this visit.   I have read or had this consent read to me. I understand the contents of this consent, which adequately explains the benefits and risks of the Services being provided via telemedicine.  I have been provided ample opportunity to ask questions regarding this consent and the Services and have had my questions answered to my satisfaction. I give my informed consent for the services to be provided through the use of telemedicine in my medical care

## 2024-02-03 NOTE — Telephone Encounter (Signed)
   Pre-operative Risk Assessment    Patient Name: Jordan Chan  DOB: [birth year] MRN: [id number]   Date of last office visit: 01/14/24 ELLOUISE CLASS, FNP [facility] Date of next office visit: 02/18/24 ELLOUISE CLASS, FNP [facility]   Request for Surgical Clearance    Procedure:  PAIN PUMP REVISION  Date of Surgery:  Clearance 02/11/24                                Surgeon:  DR. IRINEO JUBA Surgeon's Group or Practice Name:  [facility] Phone number:  [phone] Fax number:  [phone] DORISE PEREYRA, FNP   Type of Clearance Requested:   - Medical  - Pharmacy:  Hold Apixaban  (Eliquis ) x 3 DAYS PRIOR   Type of Anesthesia:  General    Additional requests/questions:    [name]   02/03/2024, 11:18 AM

## 2024-02-03 NOTE — Telephone Encounter (Signed)
 S/W pt and scheduled TELE Preop appt for 02/08/24  Med Rec and Consent done

## 2024-02-03 NOTE — Telephone Encounter (Signed)
-----   Message from Dorise CHARLENA Pereyra sent at 02/03/2024 11:04 AM EDT ----- Regarding: Request for pre-operative cardiac clearance Request for pre-operative cardiac clearance:  1. What type of surgery is being performed?  PAIN PUMP REVISION  2. When is this surgery scheduled?  02/11/2024  3. Type of clearance being requested (medical, pharmacy, both)? BOTH   4. Are there any medications that need to be held prior to surgery? APIXABAN  x 3 days  5. Practice name and name of physician performing surgery?  Performing surgeon: Dr. Irineo Juba, MD Requesting clearance: Dorise Pereyra, FNP-C    6. Anesthesia type (none, local, MAC, general)? GENERAL  7. What is the office phone and fax number?   Fax: [phone]  ATTENTION: Unable to create telephone message as per your standard workflow. Directed by [facility] providers to send requests for cardiac clearance to this pool for appropriate distribution to provider covering pre-operative clearances.   Dorise Pereyra, MSN, APRN, FNP-C, CEN [facility]  Peri-operative Services Nurse Practitioner Phone: [phone] 02/03/24 11:04 AM

## 2024-02-03 NOTE — Telephone Encounter (Signed)
   Name: Jordan Chan  DOB: 01-Apr-1951  MRN: [id number]  Primary Cardiologist: Lonni Hanson, MD   Preoperative team, please contact this patient and set up a phone call appointment for further preoperative risk assessment. Please obtain consent and complete medication review. Thank you for your help.  I confirm that guidance regarding antiplatelet and oral anticoagulation therapy has been completed and, if necessary, noted below.  Per Pharmacy 02/03/2024 Per office protocol, patient can hold Eliquis  for 3 days prior to procedure.   Patient was previously cleared by Dr. Hanson to hold Eliquis  3 days. She should resume Eliquis  as soon as safely possible due to her stroke and VTE risk.  I also confirmed the patient resides in the state of [place] . As per [facility] Medical Board telemedicine laws, the patient must reside in the state in which the provider is licensed.   Lamarr Satterfield, NP 02/03/2024, 3:57 PM [facility]

## 2024-02-03 NOTE — Telephone Encounter (Signed)
 Patient with diagnosis of afib on Eliquis  for anticoagulation.    Procedure:  PAIN PUMP REVISION  Date of procedure: 02/11/24   CHA2DS2-VASc Score = 8   This indicates a 10.8% annual risk of stroke. The patient's score is based upon: CHF History: 1 HTN History: 1 Diabetes History: 1 Stroke History: 2 Vascular Disease History: 1 Age Score: 1 Gender Score: 1      Pt with a TIA many years ago and a PE in 2021  CrCl 37 ml/min Platelet count 374  Patient has not had an Afib/aflutter ablation within the last 3 months or DCCV within the last 30 days  Per office protocol, patient can hold Eliquis  for 3 days prior to procedure.   Patient was previously cleared by Dr. Mady to hold Eliquis  3 days. She should resume Eliquis  as soon as safely possible due to her stroke and VTE risk.  **This guidance is not considered finalized until pre-operative APP has relayed final recommendations.**

## 2024-02-04 ENCOUNTER — Other Ambulatory Visit: Payer: Self-pay | Admitting: Pain Medicine

## 2024-02-06 ENCOUNTER — Ambulatory Visit

## 2024-02-08 ENCOUNTER — Ambulatory Visit: Attending: Cardiology | Admitting: Nurse Practitioner

## 2024-02-08 DIAGNOSIS — Z01810 Encounter for preprocedural cardiovascular examination: Secondary | ICD-10-CM

## 2024-02-08 NOTE — Progress Notes (Signed)
 Virtual Visit via Telephone Note   Because of Jordan Chan co-morbid illnesses, she is at least at moderate risk for complications without adequate follow up.  This format is felt to be most appropriate for this patient at this time.  Due to technical limitations with video connection (technology), today's appointment will be conducted as an audio only telehealth visit, and Jordan Chan verbally agreed to proceed in this manner.   All issues noted in this document were discussed and addressed.  No physical exam could be performed with this format.  Evaluation Performed:  Preoperative cardiovascular risk assessment _____________   Date:  02/08/2024   Patient ID:  Jordan Chan, DOB June 11, 1951, MRN [id number] Patient Location:  Home Provider location:   Office  Primary Care Provider:  [name], NP Primary Cardiologist:  Lonni Hanson, MD  Chief Complaint / Patient Profile   73 y.o. y/o female with a h/o paroxysmal atrial fibrillation, stress induced cardiomyopathy, PE, hypertension, hyperlipidemia, type 2 diabetes, chronic pain syndrome s/p intrathecal pump, torticollis, breast cancer, anemia, IBS, OSA, and GERD who is pending pain pump revision on 02/11/2024 with Dr. Eric Juba of [facility] and presents today for telephonic preoperative cardiovascular risk assessment.  History of Present Illness    [name] is a 73 y.o. female who presents via audio/video conferencing for a telehealth visit today.  Pt was last seen in cardiology clinic on 12/31/2023 by Ellouise Class, NP.  At that time TEMIA DEBROUX was doing well.  The patient is now pending procedure as outlined above. Since her last visit, she has done well from a cardiac standpoint.   She denies chest pain, palpitations, dyspnea, pnd, orthopnea, n, v, dizziness, syncope, edema, weight gain, or early satiety. All other systems reviewed and are otherwise negative except as noted above.   Past  Medical History    Past Medical History:  Diagnosis Date   Anemia    Anxiety    Aortic atherosclerosis (HCC)    Arthritis    Asthma    Atrial fibrillation (HCC)    Breast cancer (HCC) 1998   right breast ca with mastectomy and chemotherapy and radiation   Bronchitis    CHF (congestive heart failure) (HCC)    with Morphine  withdrawal   Chronic kidney disease    Complication of anesthesia    Decreased range of motion of intervertebral discs of cervical spine;   COPD (chronic obstructive pulmonary disease) (HCC)    Depression    Diabetes mellitus without complication (HCC)    Diverticulitis    diverticulosis also   Dyspnea    Dysrhythmia    Endometriosis    GERD (gastroesophageal reflux disease)    History of shingles 2000-2005   Hypercholesteremia    Hypertension    IBS (irritable bowel syndrome)    Low back pain    a. Implanted morphine /bupivicaine/clonidine  pump.   Neuropathy    Orthopnea    Oxygen  dependent    uses at night   Personal history of chemotherapy    Personal history of radiation therapy    Pneumonia    pneumonia 5-6 times, history of bronchitis also   Pulmonary emboli (HCC) 2025   Scoliosis    Sleep apnea    does not use cpap   Stroke (HCC) 2010   TIA, 10 years ago   Withdrawal from sedative drug (HCC)    withdrawal from morphine  when pump batteries died   Past Surgical History:  Procedure Laterality Date  ABDOMINAL HYSTERECTOMY  1987   BACK SURGERY     Tailbone removed following fracture   BREAST SURGERY Right    mastectomy   CARDIAC CATHETERIZATION     CATARACT EXTRACTION W/PHACO Right 05/19/2019   Procedure: CATARACT EXTRACTION PHACO AND INTRAOCULAR LENS PLACEMENT (IOC), RIGHT, DIABETIC;  Surgeon: Ferol Rogue, MD;  Location: [facility] ORS;  Service: Ophthalmology;  Laterality: Right;  Lot # [id number] H US : 00:43.8 CDE: 4.59   CATARACT EXTRACTION W/PHACO Left 06/16/2019   Procedure: CATARACT EXTRACTION PHACO AND INTRAOCULAR LENS PLACEMENT  (IOC) LEFT VISION BLUE DIABETIC;  Surgeon: Ferol Rogue, MD;  Location: [facility] ORS;  Service: Ophthalmology;  Laterality: Left;  Lot #[id number] H US : 00:46.9 CDE: 6.53   COCCYX REMOVAL     COLONOSCOPY WITH PROPOFOL  N/A 01/01/2017   Procedure: COLONOSCOPY WITH PROPOFOL ;  Surgeon: Therisa Bi, MD;  Location: [facility] ENDOSCOPY;  Service: Endoscopy;  Laterality: N/A;   COLONOSCOPY WITH PROPOFOL  N/A 12/11/2021   Procedure: COLONOSCOPY WITH PROPOFOL ;  Surgeon: Unk Corinn Skiff, MD;  Location: [facility] ENDOSCOPY;  Service: Gastroenterology;  Laterality: N/A;   ELBOW ARTHROSCOPY WITH TENDON RECONSTRUCTION     ESOPHAGOGASTRODUODENOSCOPY (EGD) WITH PROPOFOL  N/A 01/01/2017   Procedure: ESOPHAGOGASTRODUODENOSCOPY (EGD) WITH PROPOFOL ;  Surgeon: Therisa Bi, MD;  Location: [facility] ENDOSCOPY;  Service: Endoscopy;  Laterality: N/A;   ESOPHAGOGASTRODUODENOSCOPY (EGD) WITH PROPOFOL  N/A 12/11/2021   Procedure: ESOPHAGOGASTRODUODENOSCOPY (EGD) WITH PROPOFOL ;  Surgeon: Unk Corinn Skiff, MD;  Location: [facility] ENDOSCOPY;  Service: Gastroenterology;  Laterality: N/A;   INTRATHECAL PUMP IMPLANT     LEFT HEART CATH AND CORONARY ANGIOGRAPHY N/A 04/28/2017   Procedure: LEFT HEART CATH AND CORONARY ANGIOGRAPHY;  Surgeon: Mady Bruckner, MD;  Location: [facility] INVASIVE CV LAB;  Service: Cardiovascular;  Laterality: N/A;   MASTECTOMY Right 06/1997   morphine  pump  2011   PLANTAR FASCIA RELEASE     TOTAL HIP ARTHROPLASTY Left    TRIGGER FINGER RELEASE      Allergies  Allergies  Allergen Reactions   Other Palpitations    IV steroids   Pain Patch [Menthol] Anaphylaxis   Jardiance  [Empagliflozin ] Other (See Comments)    Yeast infection & dry mouth   Prednisone  Other (See Comments)    prednisone    Avelox  [Moxifloxacin  Hcl In Nacl] Other (See Comments)    Upset stomach   Doxycycline  Diarrhea   Erythromycin  Nausea Only and Other (See Comments)    Can take a Z-Pak just fine Other reaction(s): Other (See Comments) Can take  Z-Pak  Can take a Z-Pak just fine   Fentanyl  Nausea Only and Rash   Moxifloxacin  Hcl Other (See Comments)    Upset stomach Upset stomach   Oxycontin [Oxycodone] Hives   Ozempic [Semaglutide] Nausea Only    Home Medications    Prior to Admission medications   Medication Sig Start Date End Date Taking? Authorizing Provider  ACCU-CHEK AVIVA PLUS test strip TEST THREE TIMES DAILY 05/11/21   [name], Jolene T, NP  Accu-Chek Softclix Lancets lancets TEST BLOOD SUGAR THREE TIMES DAILY 12/16/23   [name], Jolene T, NP  albuterol  (PROVENTIL ) (2.5 MG/3ML) 0.083% nebulizer solution Take 3 mLs (2.5 mg total) by nebulization every 4 (four) hours as needed for wheezing or shortness of breath. 08/22/23 08/21/24  Kadali, Renuka A, MD  albuterol  (VENTOLIN  HFA) 108 (90 Base) MCG/ACT inhaler Inhale 2 puffs into the lungs every 6 (six) hours as needed for wheezing or shortness of breath. 08/22/23   Kadali, Renuka A, MD  alum & mag hydroxide-simeth (MAALOX/MYLANTA) 200-200-20 MG/5ML suspension Take 30 mLs  by mouth every 6 (six) hours as needed for indigestion or heartburn. 08/22/23   Kadali, Renuka A, MD  apixaban  (ELIQUIS ) 5 MG TABS tablet Take 1 tablet (5 mg total) by mouth 2 (two) times daily. 12/18/23   [name], Jolene T, NP  atorvastatin  (LIPITOR) 40 MG tablet Take 1 tablet (40 mg total) by mouth daily. 12/18/23   [name], Jolene T, NP  Blood Glucose Monitoring Suppl (TRUE METRIX METER) w/Device KIT Use to check blood sugar 4 times a day 05/14/21   [name], Jolene T, NP  busPIRone  (BUSPAR ) 5 MG tablet Take 1 tablet (5 mg total) by mouth 2 (two) times daily. 08/22/23   Kadali, Renuka A, MD  cholecalciferol  (VITAMIN D3) 25 MCG (1000 UNIT) tablet Take 1,000 Units by mouth daily.    [provider]  Continuous Glucose Receiver (FREESTYLE LIBRE 2 READER) DEVI To check blood sugars 3-4 times daily due to insulin  use. DX E11.40 12/11/22   [name], Jolene T, NP  Continuous Glucose Sensor (FREESTYLE LIBRE 2 SENSOR)  MISC USE 1 SENSOR TO CHECK BLOOD SUGARS 3-4 TIMES DAILY DUE TO INSULIN  USE 11/13/23   [name], Jolene T, NP  Cyanocobalamin  1000 MCG/ML KIT Inject 1,000 mcg as directed every 30 (thirty) days. Next injection 3/21    [provider]  cyclobenzaprine  (FLEXERIL ) 5 MG tablet Take 5 mg by mouth daily as needed for muscle spasms. 10/07/23   [provider]  dapagliflozin  propanediol (FARXIGA ) 10 MG TABS tablet Take 1 tablet (10 mg total) by mouth daily before breakfast. 10/22/23   Donette Ellouise LABOR, FNP  docusate sodium  (COLACE) 100 MG capsule Take 1 capsule (100 mg total) by mouth 2 (two) times daily as needed for mild constipation. 08/22/23   Kadali, Renuka A, MD  Dulaglutide  (TRULICITY ) 4.5 MG/0.5ML SOAJ Inject 4.5 mg into the skin once a week. 12/18/23   [name], Jolene T, NP  ferrous sulfate  325 (65 FE) MG EC tablet Take 1 tablet (325 mg total) by mouth 2 (two) times daily. 08/22/23 08/21/24  Kadali, Renuka A, MD  folic acid  (FOLVITE ) 1 MG tablet TAKE 1 TABLET EVERY DAY 10/14/21   Dasie Tinnie MATSU, NP  furosemide  (LASIX ) 40 MG tablet Take 0.5 tablets (20 mg total) by mouth daily. 08/22/23   Kadali, Renuka A, MD  gabapentin  (NEURONTIN ) 400 MG capsule Take 1 capsule (400 mg total) by mouth 3 (three) times daily. 12/18/23   [name], Jolene T, NP  insulin  lispro (HUMALOG  KWIKPEN) 100 UNIT/ML KwikPen Inject 5 Units into the skin 3 (three) times daily before meals. Do not inject insulin  if you are not going to eat meal.  Only take before a meal. 12/18/23   [name], Jolene T, NP  metFORMIN  (GLUCOPHAGE ) 1000 MG tablet Take 1 tablet (1,000 mg total) by mouth 2 (two) times daily with a meal. 12/18/23   [name], Jolene T, NP  metoprolol  succinate (TOPROL -XL) 50 MG 24 hr tablet Take 1 tablet (50 mg total) by mouth daily. Take with or immediately following a meal. 01/15/24 04/14/24  Donette Ellouise A, FNP  montelukast  (SINGULAIR ) 10 MG tablet Take 1 tablet (10 mg total) by mouth at bedtime. 12/18/23   [name], Jolene T,  NP  naloxone  (NARCAN ) nasal spray 4 mg/0.1 mL Place 1 spray into the nose as needed for up to 365 doses (for opioid-induced respiratory depresssion). In case of emergency (overdose), spray once into each nostril. If no response within 3 minutes, repeat application and call 911. 08/04/23 08/03/24  Tanya Glisson, MD  Nebulizers (EASY AIR COMPRESSOR NEBULIZER) MISC Use to inhaler nebulizer treatments at needed per instructions on nebulizer prescription 07/21/22   [name], Jolene T, NP  ondansetron  (ZOFRAN ) 4 MG tablet Take 1 tablet (4 mg total) by mouth daily as needed for nausea or vomiting. 03/14/23 03/13/24  Cyrena Mylar, MD  OXYGEN  Inhale 2 L into the lungs at bedtime.    [provider]  PAIN MANAGEMENT INTRATHECAL, IT, PUMP 1 each by Intrathecal route. Intrathecal (IT) medication:  Morphine  5.0 mg/ml, Bupivacaine  20.0 mg/ml,  Clonidine  100.0 mcg/ml Patient does not remember current. Adjusted every 3 months at [facility]    [provider]  pantoprazole  (PROTONIX ) 40 MG tablet Take 1 tablet (40 mg total) by mouth daily. 12/18/23   [name], Jolene T, NP  polyethylene glycol (MIRALAX  / GLYCOLAX ) 17 g packet Take 17 g by mouth daily as needed for moderate constipation. 08/22/23   Kadali, Renuka A, MD  sacubitril-valsartan (ENTRESTO ) 24-26 MG Take 1 tablet by mouth 2 (two) times daily. Patient not taking: Reported on 02/03/2024 01/14/24   Donette Ellouise LABOR, FNP  TRELEGY ELLIPTA  100-62.5-25 MCG/ACT AEPB INHALE 1 PUFF INTO THE LUNGS DAILY . 42 DAY EXPIRATION AFTER OPENING FOIL TRAY 09/21/23   [name], Jolene T, NP  TRESIBA  FLEXTOUCH 100 UNIT/ML FlexTouch Pen Inject 55 Units into the skin at bedtime. 12/18/23   [name], Jolene T, NP  venlafaxine  XR (EFFEXOR -XR) 150 MG 24 hr capsule Take 1 capsule (150 mg total) by mouth daily. 12/18/23   [name], NP    Physical Exam    Vital Signs:  GRIFFIN DEWILDE does not have vital signs available for review today.  Given  telephonic nature of communication, physical exam is limited. AAOx3. NAD. Normal affect.  Speech and respirations are unlabored.  Accessory Clinical Findings    None  Assessment & Plan    1.  Preoperative Cardiovascular Risk Assessment:  According to the Revised Cardiac Risk Index (RCRI), her Perioperative Risk of Major Cardiac Event is (%): 6.6. Her Functional Capacity in METs is: 6.36 according to the Duke Activity Status Index (DASI). Therefore, based on ACC/AHA guidelines, patient would be at acceptable risk for the planned procedure without further cardiovascular testing.  The patient was advised that if she develops new symptoms prior to surgery to contact our office to arrange for a follow-up visit, and she verbalized understanding.  Per office protocol, patient can hold Eliquis  for 3 days prior to procedure. Patient was previously cleared by Dr. Mady to hold Eliquis  3 days. She should resume Eliquis  as soon as safely possible due to her stroke and VTE risk.  A copy of this note will be routed to requesting surgeon.  Time:   Today, I have spent 5 minutes with the patient with telehealth technology discussing medical history, symptoms, and management plan.     Damien JAYSON Braver, NP  02/08/2024, 9:45 AM

## 2024-02-09 ENCOUNTER — Telehealth: Payer: Self-pay | Admitting: *Deleted

## 2024-02-09 ENCOUNTER — Encounter: Payer: Self-pay | Admitting: Pain Medicine

## 2024-02-09 ENCOUNTER — Ambulatory Visit
Admission: RE | Admit: 2024-02-09 | Discharge: 2024-02-09 | Disposition: A | Discharge status: Home / Self Care | Source: Ambulatory Visit | Attending: Neurology | Admitting: Neurology

## 2024-02-09 DIAGNOSIS — G243 Spasmodic torticollis: Secondary | ICD-10-CM | POA: Insufficient documentation

## 2024-02-09 NOTE — Patient Instructions (Signed)
 Thank you for allowing the Complex Care Management team to participate in your care. It was great speaking with you!  We will follow up on February 19, 2024 at 2 pm. Please do not hesitate to contact me if you require assistance prior to our next outreach.   Jackson Acron [facility] Population [facility] RN Care Manager Direct Dial : [phone]  Fax: [phone] Website: [email or web address]

## 2024-02-09 NOTE — Patient Outreach (Signed)
 Complex Care Management   Visit Note    Name:  Jordan Chan MRN: [id number] DOB: 12-08-1950  Situation: Referral received for Complex Care Management related to COPD and Diabetes I obtained verbal consent from Patient.  Visit completed with Jordan Chan via telephone.  Background:   Past Medical History:  Diagnosis Date   Anemia    Anxiety    Aortic atherosclerosis (HCC)    Arthritis    Asthma    Atrial fibrillation (HCC)    Breast cancer (HCC) 1998   right breast ca with mastectomy and chemotherapy and radiation   Bronchitis    CHF (congestive heart failure) (HCC)    with Morphine  withdrawal   Chronic kidney disease    Complication of anesthesia    Decreased range of motion of intervertebral discs of cervical spine;   COPD (chronic obstructive pulmonary disease) (HCC)    Depression    Diabetes mellitus without complication (HCC)    Diverticulitis    diverticulosis also   Dyspnea    Dysrhythmia    Endometriosis    GERD (gastroesophageal reflux disease)    History of shingles 2000-2005   Hypercholesteremia    Hypertension    IBS (irritable bowel syndrome)    Low back pain    a. Implanted morphine /bupivicaine/clonidine  pump.   Neuropathy    Orthopnea    Oxygen  dependent    uses at night   Personal history of chemotherapy    Personal history of radiation therapy    Pneumonia    pneumonia 5-6 times, history of bronchitis also   Pulmonary emboli (HCC) 2025   Scoliosis    Sleep apnea    does not use cpap   Stroke (HCC) 2010   TIA, 10 years ago   Withdrawal from sedative drug (HCC)    withdrawal from morphine  when pump batteries died    Assessment: Patient Reported Symptoms:  Cognitive Cognitive Status: Alert and oriented to person, place, and time, Normal speech and language skills Cognitive/Intellectual Conditions Management [RPT]: None reported or documented in medical history or problem list Health Maintenance Behaviors: Annual physical exam  (Physical Therapy) Healing Pattern: Average Health Facilitated by: Rest  Neurological Neurological Review of Symptoms: No symptoms reported Neurological Management Strategies: Routine screening Neurological Self-Management Outcome: 4 (good)  HEENT HEENT Symptoms Reported: No symptoms reported HEENT Management Strategies: Routine screening HEENT Self-Management Outcome: 4 (good)  Cardiovascular Cardiovascular Symptoms Reported: No symptoms reported Does patient have uncontrolled Hypertension?: No Cardiovascular Management Strategies: Routine screening Cardiovascular Self-Management Outcome: 4 (good)  Respiratory Respiratory Symptoms Reported: No symptoms reported Respiratory Management Strategies: Routine screening, Medication therapy Respiratory Self-Management Outcome: 4 (good)  Endocrine Endocrine Symptoms Reported: No symptoms reported Is patient diabetic?: Yes Is patient checking blood sugars at home?: Yes List most recent blood sugar readings, include date and time of day: Reports fasting readings have ranged in the 120s to 140s  Gastrointestinal Gastrointestinal Symptoms Reported: No symptoms reported  Genitourinary Genitourinary Symptoms Reported: No symptoms reported  Integumentary Integumentary Symptoms Reported: No symptoms reported  Musculoskeletal Musculoskelatal Symptoms Reviewed: Unsteady gait Additional Musculoskeletal Details: Currenlty completing Physical Therapy twice a week  Psychosocial Psychosocial Symptoms Reported: No symptoms reported Do you feel physically threatened by others?: No      02/01/2024    9:56 AM  Depression screen PHQ 2/9  Decreased Interest 0  Down, Depressed, Hopeless 1  PHQ - 2 Score 1    There were no vitals filed for this visit.  Medications Reviewed Today  Reviewed by Jordan Lima, RN (Registered Nurse) on 01/15/24 at 1424  Med List Status: <None>   Medication Order Taking? Sig Documenting Provider Last Dose Status Informant   ACCU-CHEK AVIVA PLUS test strip [id number]  TEST THREE TIMES DAILY Jordan Chan  Active Self           Med Note Jordan Chan   Mon Aug 24, 2023  9:57 AM) Uses Libre device   Accu-Chek Softclix Lancets lancets [id number]  TEST BLOOD SUGAR THREE TIMES DAILY Jordan Chan  Active   albuterol  (PROVENTIL ) (2.5 MG/3ML) 0.083% nebulizer solution [id number]  Take 3 mLs (2.5 mg total) by nebulization every 4 (four) hours as needed for wheezing or shortness of breath. Jordan Chan  Active            Med Note (Jordan Chan   Mon Aug 24, 2023 10:07 AM) Jordan Chan recently can't find neb machine   albuterol  (VENTOLIN  HFA) 108 (90 Base) MCG/ACT inhaler [id number]  Inhale 2 puffs into the lungs every 6 (six) hours as needed for wheezing or shortness of breath. Jordan Chan  Active   alum & mag hydroxide-simeth (MAALOX/MYLANTA) 200-200-20 MG/5ML suspension [id number]  Take 30 mLs by mouth every 6 (six) hours as needed for indigestion or heartburn. Jordan Chan  Active   apixaban  (ELIQUIS ) 5 MG TABS tablet [id number]  Take 1 tablet (5 mg total) by mouth 2 (two) times daily. Jordan Chan  Active   atorvastatin  (LIPITOR) 40 MG tablet [id number]  Take 1 tablet (40 mg total) by mouth daily. Jordan Chan  Active   Blood Glucose Monitoring Suppl (TRUE METRIX METER) w/Device KIT [id number]  Use to check blood sugar 4 times a day Jordan Chan  Active Self  busPIRone  (BUSPAR ) 5 MG tablet [id number]  Take 1 tablet (5 mg total) by mouth 2 (two) times daily. Jordan Chan  Active   cholecalciferol  (VITAMIN D3) 25 MCG (1000 UNIT) tablet [id number]  Take 1,000 Units by mouth daily. Provider, Historical, Chan  Active Self  Continuous Glucose Receiver (FREESTYLE LIBRE 2 READER) DEVI [id number]  To check blood sugars 3-4 times daily due to insulin  use. DX E11.40 Valerio Moris T, Chan  Active Self  Continuous Glucose Sensor (FREESTYLE LIBRE 2 SENSOR) MISC [id number]   USE 1 SENSOR TO CHECK BLOOD SUGARS 3-4 TIMES DAILY DUE TO INSULIN  USE Jordan Chan  Active   Cyanocobalamin  1000 MCG/ML KIT [id number]  Inject 1,000 mcg as directed every 30 (thirty) days. Next injection 3/21 Provider, Historical, Chan  Active Self  cyclobenzaprine  (FLEXERIL ) 5 MG tablet [id number]  Take 5 mg by mouth daily as needed for muscle spasms. Provider, Historical, Chan  Active   dapagliflozin  propanediol (FARXIGA ) 10 MG TABS tablet [id number]  Take 1 tablet (10 mg total) by mouth daily before breakfast. [place] A, FNP  Active   docusate sodium  (COLACE) 100 MG capsule [id number]  Take 1 capsule (100 mg total) by mouth 2 (two) times daily as needed for mild constipation. Jordan Chan  Active   Dulaglutide  (TRULICITY ) 4.5 MG/0.5ML SOAJ [id number]  Inject 4.5 mg into the skin once a week. Valerio Moris T, Chan  Active   ferrous sulfate  325 (65 FE) MG EC tablet [id number]  Take 1 tablet (325 mg total) by mouth 2 (two) times daily. Jordan Chan  Active   folic acid  (FOLVITE ) 1 MG tablet [id number]  TAKE 1 TABLET EVERY DAY Allen, Lauren G, Chan  Active Self  furosemide  (LASIX ) 40 MG tablet [id number]  Take 0.5 tablets (20 mg total) by mouth daily. Jordan Chan  Active   gabapentin  (NEURONTIN ) 400 MG capsule [id number]  Take 1 capsule (400 mg total) by mouth 3 (three) times daily. Jordan Chan  Active   HYDROcodone -acetaminophen  (NORCO/VICODIN) 5-325 MG tablet [id number]  Take 1 tablet by mouth every 4 (four) hours as needed. Provider, Historical, Chan  Active   insulin  lispro (HUMALOG  KWIKPEN) 100 UNIT/ML KwikPen [id number]  Inject 5 Units into the skin 3 (three) times daily before meals. Do not inject insulin  if you are not going to eat meal.  Only take before a meal. Jordan Chan  Active   linaclotide (LINZESS) 145 MCG CAPS capsule [id number]  Take 145 mcg by mouth daily before breakfast.  Patient not taking: Reported on 01/14/2024   Provider, Historical, Chan   Active   metFORMIN  (GLUCOPHAGE ) 1000 MG tablet [id number]  Take 1 tablet (1,000 mg total) by mouth 2 (two) times daily with a meal. Jordan Chan  Active   metoprolol  succinate (TOPROL -XL) 25 MG 24 hr tablet [id number]  Take 1 tablet (25 mg total) by mouth daily. Take with or immediately following a meal. [place] A, FNP  Active   montelukast  (SINGULAIR ) 10 MG tablet [id number]  Take 1 tablet (10 mg total) by mouth at bedtime. Valerio Moris T, Chan  Active   naloxone  (NARCAN ) nasal spray 4 mg/0.1 mL [id number]  Place 1 spray into the nose as needed for up to 365 doses (for opioid-induced respiratory depresssion). In case of emergency (overdose), spray once into each nostril. If no response within 3 minutes, repeat application and call 911. Naveira, Francisco, Chan  Active   Nebulizers (EASY AIR COMPRESSOR NEBULIZER) MISC [id number]  Use to inhaler nebulizer treatments at needed per instructions on nebulizer prescription Jordan Chan  Active Self  ondansetron  (ZOFRAN ) 4 MG tablet [id number]  Take 1 tablet (4 mg total) by mouth daily as needed for nausea or vomiting. Cyrena Mylar, Chan  Active Self  OXYGEN  [id number]  Inhale 2 Chan into the lungs at bedtime. Provider, Historical, Chan  Active            Med Note CATHY, PAULA   Tue Dec 29, 2023  1:15 PM) continuous  PAIN MANAGEMENT INTRATHECAL, IT, PUMP [id number]  1 each by Intrathecal route. Intrathecal (IT) medication:  Morphine  5.0 mg/ml, Bupivacaine  20.0 mg/ml,  Clonidine  100.0 mcg/ml Patient does not remember current. Adjusted every 3 months at [facility], [facility] Provider, Historical, Chan  Active Self           Med Note GAYLENE DARVIN JINNY [place] Apr 16, 2023  6:23 AM) Morphine , Bupivacaine  and clonidine    pantoprazole  (PROTONIX ) 40 MG tablet [id number]  Take 1 tablet (40 mg total) by mouth daily. Jordan Chan  Active   polyethylene glycol (MIRALAX  / GLYCOLAX ) 17 g packet [id number]  Take 17 g by mouth daily as needed for moderate  constipation. Jordan Chan  Active   sacubitril-valsartan (ENTRESTO ) 24-26 MG [id number]  Take 1 tablet by mouth 2 (two) times daily. [place] LABOR, [place]  Active   TRELEGY ELLIPTA  100-62.5-25 MCG/ACT AEPB [id number]  INHALE 1 PUFF INTO THE LUNGS DAILY . 42 DAY EXPIRATION AFTER OPENING FOIL TRAY [place], Jordan Chan  Active   TRESIBA  FLEXTOUCH 100 UNIT/ML FlexTouch Pen [id number]  Inject 55 Units into the skin at bedtime. Jordan Chan  Active   venlafaxine  XR (EFFEXOR -XR) 150 MG 24 hr capsule [id number]  Take 1 capsule (150 mg total) by mouth daily. [name], Chan  Active   Med List Note Geanie Wolm HERO, RN 12/15/23 1412): 12-15-2023  Clearance to stop Eliquis  for 3 days prior to pump replacement sent to   [facility]  [facility]            Recommendation:   Continue Current Plan of Care  Follow Up Plan:   Telephone follow up appointment with Nurse Case Manager on February 19, 2024   Jackson Acron [facility] RN Care Manager Direct Dial : [phone]  Fax: [phone] Website: [email or web address]

## 2024-02-09 NOTE — Progress Notes (Signed)
 Perioperative / Anesthesia Services  Pre-Admission Testing Clinical Review / Pre-Operative Anesthesia Consult  Date: 02/09/24  PATIENT DEMOGRAPHICS: Name: Jordan Chan DOB: 21-Jul-1951 MRN:   [id number]  Note: Available PAT nursing documentation and vital signs have been reviewed. Clinical nursing staff has updated patient's PMH/PSHx, current medication list, and drug allergies/intolerances to ensure complete and comprehensive history available to assist care teams in MDM as it pertains to the aforementioned surgical procedure and anticipated anesthetic course. Extensive review of available clinical information personally performed. [place] PMH and PSHx updated with any diagnoses/procedures that  may have been inadvertently omitted during his intake with the pre-admission testing department's nursing staff.  PLANNED SURGICAL PROCEDURE(S):   Case: [id number] Date/Time: 02/11/24 0715   Procedure: PAIN PUMP REVISION   Anesthesia type: Monitor Anesthesia Care   Pre-op diagnosis: LOW BACK PAIN   Location: [facility] OR ROOM 09 / [facility]   Surgeons: Tanya Glisson, MD        CLINICAL DISCUSSION: Jordan Chan is a 73 y.o. female who is submitted for pre-surgical anesthesia review and clearance prior to her undergoing the above procedure. Patient is a Former Smoker (30 pack years; quit 04/2004). Pertinent PMH includes: paroxysmal atrial fibrillation, Takotsubo cardiomyopathy, HFpEF, TIA, chronic cerebral microvascular disease, PE, aortic atherosclerosis, HTN, HLD, T2DM, CKD-III, COPD, asthma, nocturnal supplemental oxygen  dependence (2L/[place]), orthopnea, OSAH (no nocturnal PAP therapy), anemia, chronic pain syndrome, cervical DDD, chronic back pain (has intrathecal pump), scoliosis, OA, decreased cervical ROM, diabetic neuropathy, anxiety, depression.  Patient is followed by cardiology (End, MD). She was last seen in the cardiology clinic on 08/24/2018 for; notes  reviewed. At the time of her clinic visit, patient doing well overall from a cardiovascular perspective.  Patient with chronic exertional dyspnea related to her underlying COPD diagnosis; reportedly stable and at baseline.  Patient denied any chest pain, shortness of breath, PND, orthopnea, palpitations, significant peripheral edema, weakness, fatigue, vertiginous symptoms, or presyncope/syncope. Patient with a past medical history significant for cardiovascular diagnoses. Documented physical exam was grossly benign, providing no evidence of acute exacerbation and/or decompensation of the patient's known cardiovascular conditions.  Patient with a diagnosis of Takotsubo cardiomyopathy (stress-induced) in 04/2017 in the setting of intrathecal pain pump malfunction and subsequent opioid withdrawal.  Echocardiogram at that time showed severely reduced left ventricular systolic dysfunction with an EF of 25-30%.  Patient also underwent diagnostic LEFT heart catheterization on 04/28/2017 as an invasive confirmation of the patient's severely reduced left ventricular function.  Via catheterization, EF confirmed at 25 to 30% with mid and apical akinesis. There was no angiographically significant coronary artery disease noted.  Left ventricular filling pressures mildly elevated.  Findings are consistent with the aforementioned Takotsubo cardiomyopathy diagnosis.  Most recent myocardial perfusion imaging study was performed on 11/14/2021 revealing a normal left ventricular systolic function with an EF of 67%.  There were no regional wall motion abnormalities.  No artifact or left ventricular cavity size enlargement appreciated on review of imaging. SPECT images demonstrated a small region of apical thinning likely secondary to attenuation artifact.  Overall, there was no evidence of stress-induced myocardial ischemia or arrhythmia; no scintigraphic evidence of scar.  TID ratio = 0.85. Study determined to be normal and low  risk.  Most recent TTE performed on 08/11/2023 revealed a severely reduced left ventricular systolic function with an EF of 25-30%.  There was global left ventricular hypokinesis.  Left ventricular diastolic Doppler parameters consistent with abnormal relaxation (G1DD). Right ventricular size and  function normal. There was trivial mitral and tricuspid valve regurgitation.  All transvalvular gradients were noted to be normal providing no evidence of hemodynamically significant valvular stenosis. Aorta normal in size with no evidence of ectasia or aneurysmal dilatation.  Patient with an atrial fibrillation diagnosis; CHA2DS2-VASc Score = 8 (age, sex, HFpEF, HTN, TIA/PE x 2, vascular disease, T2DM). Her rate and rhythm are currently being maintained on oral metoprolol  succinate. She is chronically anticoagulated using apixaban ; reported to be compliant with therapy with no evidence or reports of GI/GU bleeding.  Cardiomyopathy and HFpEF being managed on GDMT interventions including diuretic (furosemide ), beta-blocker (metoprolol  succinate), and ARB/ARNI (Entresto ) therapies.  Blood pressure last documented at 106/60 mmHg.  She is on atorvastatin  for her HLD diagnosis and further ASCVD prevention. T2DM loosely controlled on currently prescribed regimen; last HgbA1c was 7.5% when checked on 12/18/2023. In the setting of known cardiovascular diagnoses and concurrent T2DM, patient is taking an SGLT2i (dapagliflozin ) for added cardiovascular and renovascular protection.  Patient does have an OSAH diagnosis, however does not require use of nocturnal PAP therapy.  With that said, patient does require the use of supplemental oxygen  at bedtime due to her COPD and resulting nocturnal hypoxia.  Functional capacity limited by patient's chronic pain syndrome; notes significant pain in her hip and lower back.  Again, patient has intrathecal pump in place that is being maintained by pain management.  With that said, patient is  able to complete all of her ADL/IADLs without significant cardiovascular limitation.  Per the DASI, patient able to exceed 4 METS of physical activity without experiencing any significant degrees of angina/anginal equivalent symptoms.  No changes were made to her medication regimen.  Patient to follow-up with outpatient cardiology in 6 months or sooner if needed.  [name] is scheduled for an elective PAIN PUMP REVISION on 02/11/2024 with Dr. Eric Como, MD.  Given patient's past medical history significant for cardiovascular diagnoses, presurgical cardiac clearance was sought by the PAT team.  Per cardiology, according to the Revised Cardiac Risk Index (RCRI), her Perioperative Risk of Major Cardiac Event is (%): 6.6. Her Functional Capacity in METs is: 6.36 according to the [place] Activity Status Index (DASI). Therefore, based on ACC/AHA guidelines, patient would be at ACCEPTABLE risk for the planned procedure without further cardiovascular testing.  Again, this patient is on daily oral anticoagulation therapy using a DOAC medication.  She has been instructed on recommendations for holding her apixaban  for 3 days prior to her procedure with plans to restart as soon as postoperative bleeding risk felt to be minimized by his primary attending surgeon. The patient has been instructed that her last dose of should be on 02/07/2024.  Patient denies previous perioperative complications with anesthesia in the past. In review her EMR, it is noted that patient underwent a general anesthetic course here at [facility] ([facility]) in 12/11/2021 without documented complications.   MOST RECENT VITAL SIGNS:    02/01/2024    9:51 AM 01/28/2024   11:00 PM 01/28/2024   10:30 PM  Vitals with BMI  Height 4' 10    Weight 142 lbs    BMI 29.69    Systolic 123 119 884  Diastolic 71 63 52  Pulse 80 86 90   PROVIDERS/SPECIALISTS: NOTE: Primary physician provider listed  below. Patient may have been seen by APP or partner within same practice.   PROVIDER ROLE / SPECIALTY LAST SHERLEAN Como Eric, MD Pain Management (Surgeon) 02/01/2024  [name], NP Primary Care Provider 12/18/2023  Mady Bruckner, MD Cardiology 06/25/2023; preop APP call 02/08/2024  Donette [place], FNP-C Advanced Heart Failure 01/14/2024  Theotis Chancellor, MD Pulmonary Medicine 02/02/2024   ALLERGIES: Allergies  Allergen Reactions   Other Palpitations    IV steroids   Pain Patch [Menthol] Anaphylaxis   Jardiance  [Empagliflozin ] Other (See Comments)    Yeast infection & dry mouth   Prednisone     Avelox  [Moxifloxacin  Hcl In Nacl] Other (See Comments)    Upset stomach   Doxycycline  Diarrhea   Erythromycin  Nausea Only and Other (See Comments)    Has used azithromycin  without documented ADRs   Fentanyl  Nausea Only and Rash   Moxifloxacin  Hcl Other (See Comments)    Upset stomach   Oxycontin [Oxycodone] Hives   Ozempic [Semaglutide] Nausea Only    CURRENT HOME MEDICATIONS:  cyanocobalamin  (VITAMIN B12) injection 1,000 mcg    ACCU-CHEK AVIVA PLUS test strip   Accu-Chek Softclix Lancets lancets   albuterol  (PROVENTIL ) (2.5 MG/3ML) 0.083% nebulizer solution   albuterol  (VENTOLIN  HFA) 108 (90 Base) MCG/ACT inhaler   alum & mag hydroxide-simeth (MAALOX/MYLANTA) 200-200-20 MG/5ML suspension   apixaban  (ELIQUIS ) 5 MG TABS tablet   atorvastatin  (LIPITOR) 40 MG tablet   Blood Glucose Monitoring Suppl (TRUE METRIX METER) w/Device KIT   busPIRone  (BUSPAR ) 5 MG tablet   cholecalciferol  (VITAMIN D3) 25 MCG (1000 UNIT) tablet   Continuous Glucose Receiver (FREESTYLE LIBRE 2 READER) DEVI   Continuous Glucose Sensor (FREESTYLE LIBRE 2 SENSOR) MISC   Cyanocobalamin  1000 MCG/ML KIT   cyclobenzaprine  (FLEXERIL ) 5 MG tablet   dapagliflozin  propanediol (FARXIGA ) 10 MG TABS tablet   docusate sodium  (COLACE) 100 MG capsule   Dulaglutide  (TRULICITY ) 4.5 MG/0.5ML SOAJ   ferrous  sulfate 325 (65 FE) MG EC tablet   folic acid  (FOLVITE ) 1 MG tablet   furosemide  (LASIX ) 40 MG tablet   gabapentin  (NEURONTIN ) 400 MG capsule   insulin  lispro (HUMALOG  KWIKPEN) 100 UNIT/ML KwikPen   metFORMIN  (GLUCOPHAGE ) 1000 MG tablet   metoprolol  succinate (TOPROL -XL) 50 MG 24 hr tablet   montelukast  (SINGULAIR ) 10 MG tablet   naloxone  (NARCAN ) nasal spray 4 mg/0.1 mL   Nebulizers (EASY AIR COMPRESSOR NEBULIZER) MISC   ondansetron  (ZOFRAN ) 4 MG tablet   OXYGEN    PAIN MANAGEMENT INTRATHECAL, IT, PUMP   pantoprazole  (PROTONIX ) 40 MG tablet   polyethylene glycol (MIRALAX  / GLYCOLAX ) 17 g packet   sacubitril-valsartan (ENTRESTO ) 24-26 MG   TRELEGY ELLIPTA  100-62.5-25 MCG/ACT AEPB   TRESIBA  FLEXTOUCH 100 UNIT/ML FlexTouch Pen   venlafaxine  XR (EFFEXOR -XR) 150 MG 24 hr capsule   HISTORY: Past Medical History:  Diagnosis Date   (HFpEF) heart failure with preserved ejection fraction (HCC)    Anemia    Anxiety    Aortic atherosclerosis (HCC)    Arthritis    Asthma    Bilateral pulmonary embolism (small subsegmental) 08/11/2023   Breast cancer, right (HCC) 1998   a.) Tx'd with surgical excision + chemotherapy + XRT   Bronchitis    Cerebral microvascular disease    Chronic pain syndrome    CKD (chronic kidney disease), stage III (HCC)    COPD (chronic obstructive pulmonary disease) (HCC)    DDD (degenerative disc disease), cervical    Decreased ROM of intervertebral discs of cervical spine    Dependence on nocturnal oxygen  therapy (2L/[place])    Depression    Diabetes mellitus type 2, insulin  dependent (HCC)    Diverticulitis    Dyspnea  Endometriosis    GERD (gastroesophageal reflux disease)    History of shingles    Hypercholesteremia    Hypertension    IBS (irritable bowel syndrome)    Low back pain    a.) has intrathecal pump (morphine /bupivicaine/clonidine )   Neuropathy    On apixaban  therapy    Opioid withdrawal (HCC)    a.) experiences withdrawal symptoms when  batteries in intrathecal pump reach EOL   Orthopnea    PAF (paroxysmal atrial fibrillation) (HCC)    a.) CHA2DS2VASc = 8 (age, sex, HFpEF, HTN, TIA/PE x 2, vascular disease, T2DM) as of metoprolol  succinate; b.) rate/rhythm maintained on oral metoprolol  succinate; chronically anticoagulated with apixaban    Personal history of chemotherapy    Personal history of radiation therapy    Pneumonia    Scoliosis    Sleep apnea    a.) no nocturnal PAP therapy; requires supplemental oxygen  at bedtime   Status post bilateral cataract extraction 2020   Takotsubo cardiomyopathy    TIA (transient ischemic attack) 2010   Past Surgical History:  Procedure Laterality Date   ABDOMINAL HYSTERECTOMY  1987   BACK SURGERY     Tailbone removed following fracture   BREAST SURGERY Right    mastectomy   CARDIAC CATHETERIZATION     CATARACT EXTRACTION W/PHACO Right 05/19/2019   Procedure: CATARACT EXTRACTION PHACO AND INTRAOCULAR LENS PLACEMENT (IOC), RIGHT, DIABETIC;  Surgeon: Ferol Rogue, MD;  Location: [facility] ORS;  Service: Ophthalmology;  Laterality: Right;  Lot # [id number] H US : 00:43.8 CDE: 4.59   CATARACT EXTRACTION W/PHACO Left 06/16/2019   Procedure: CATARACT EXTRACTION PHACO AND INTRAOCULAR LENS PLACEMENT (IOC) LEFT VISION BLUE DIABETIC;  Surgeon: Ferol Rogue, MD;  Location: [facility] ORS;  Service: Ophthalmology;  Laterality: Left;  Lot #[id number] H US : 00:46.9 CDE: 6.53   COCCYX REMOVAL     COLONOSCOPY WITH PROPOFOL  N/A 01/01/2017   Procedure: COLONOSCOPY WITH PROPOFOL ;  Surgeon: Therisa Bi, MD;  Location: [facility] ENDOSCOPY;  Service: Endoscopy;  Laterality: N/A;   COLONOSCOPY WITH PROPOFOL  N/A 12/11/2021   Procedure: COLONOSCOPY WITH PROPOFOL ;  Surgeon: Unk Corinn Skiff, MD;  Location: [facility] ENDOSCOPY;  Service: Gastroenterology;  Laterality: N/A;   ELBOW ARTHROSCOPY WITH TENDON RECONSTRUCTION     ESOPHAGOGASTRODUODENOSCOPY (EGD) WITH PROPOFOL  N/A 01/01/2017   Procedure: ESOPHAGOGASTRODUODENOSCOPY  (EGD) WITH PROPOFOL ;  Surgeon: Therisa Bi, MD;  Location: [facility] ENDOSCOPY;  Service: Endoscopy;  Laterality: N/A;   ESOPHAGOGASTRODUODENOSCOPY (EGD) WITH PROPOFOL  N/A 12/11/2021   Procedure: ESOPHAGOGASTRODUODENOSCOPY (EGD) WITH PROPOFOL ;  Surgeon: Unk Corinn Skiff, MD;  Location: [facility] ENDOSCOPY;  Service: Gastroenterology;  Laterality: N/A;   INTRATHECAL PUMP IMPLANT     LEFT HEART CATH AND CORONARY ANGIOGRAPHY N/A 04/28/2017   Procedure: LEFT HEART CATH AND CORONARY ANGIOGRAPHY;  Surgeon: Mady Bruckner, MD;  Location: [facility] INVASIVE CV LAB;  Service: Cardiovascular;  Laterality: N/A;   MASTECTOMY Right 06/1997   morphine  pump  2011   PLANTAR FASCIA RELEASE     TOTAL HIP ARTHROPLASTY Left    TRIGGER FINGER RELEASE     Family History  Problem Relation Age of Onset   Cancer Mother 36       lung   Thyroid  disease Mother    Cancer Father 29       Pancreatic   Hypertension Sister    Cancer Sister        cancer on face   Hyperlipidemia Sister    Osteoporosis Maternal Grandmother    Cancer Paternal Grandmother        colon   Arthritis  Paternal Grandfather    Hyperlipidemia Son    Seizures Son    Breast cancer Neg Hx    Social History   Tobacco Use   Smoking status: Former    Current packs/day: 0.00    Average packs/day: 1 pack/day for 30.0 years (30.0 ttl pk-yrs)    Types: Cigarettes    Start date: 04/30/1974    Quit date: 04/30/2004    Years since quitting: 19.7    Passive exposure: Past   Smokeless tobacco: Never  Substance Use Topics   Alcohol  use: No   LABS:  Lab Results  Component Value Date   WBC 13.2 (H) 01/28/2024   HGB 10.9 (L) 01/28/2024   HCT 34.9 (L) 01/28/2024   MCV 93.6 01/28/2024   PLT 374 01/28/2024   Lab Results  Component Value Date   NA 139 01/28/2024   CL 100 01/28/2024   K 3.8 01/28/2024   CO2 25 01/28/2024   BUN 18 01/28/2024   CREATININE 1.06 (H) 01/28/2024   GFRNONAA 55 (L) 01/28/2024   CALCIUM  8.6 (L) 01/28/2024   PHOS 3.7  08/19/2023   ALBUMIN 4.1 12/18/2023   GLUCOSE 211 (H) 01/28/2024   Lab Results  Component Value Date   HGBA1C 7.5 (H) 12/18/2023    ECG: Date: 01/28/2024  Time ECG obtained: 1848 PM Rate: 99 bpm Rhythm: normal sinus Axis (leads I and aVF): normal Intervals: PR 170 ms. QRS 72 ms. QTc 446 ms. ST segment and T wave changes: No evidence of acute T wave abnormalities or significant ST segment elevation or depression.  Evidence of a possible, age undetermined, prior infarct:  Yes; anteroseptal Comparison: Previous tracing obtained on 08/09/2023  showed sinus tachycardia at a rate of 130 bpm and a LBBB with evidence of inferior and anterior infarcts.   IMAGING / PROCEDURES: TRANSTHORACIC ECHOCARDIOGRAM performed on 08/11/2023 Left ventricular ejection fraction, by estimation, is 25 to 30%. The left ventricle has severely decreased function. The left ventricle demonstrates global hypokinesis. Left ventricular diastolic parameters are consistent with Grade I diastolic dysfunction (impaired relaxation).  Right ventricular systolic function is low normal. The right ventricular size is normal. Tricuspid regurgitation signal is inadequate for assessing PA pressure.  The mitral valve is normal in structure. Trivial mitral valve regurgitation. No evidence of mitral stenosis.  The aortic valve was not well visualized. Aortic valve regurgitation is not visualized. No aortic stenosis is present.   CT ABDOMEN PELVIS W CONTRAST performed on 08/09/2023 Study is positive for small subsegmental sized pulmonary emboli in the lungs bilaterally. Severe multilobar bilateral pneumonia predominantly dependently located in the lungs bilaterally, concerning for aspiration pneumonia. Small bilateral parapneumonic pleural effusions lying dependently, simple in appearance at this time. Extensive periportal edema in the liver. This is a nonspecific finding. Correlation with liver function tests is recommended. Colonic  diverticulosis without evidence of acute diverticulitis at this time. Aortic atherosclerosis, in addition to left main and circumflex coronary artery disease. Assessment for potential risk factor modification, dietary therapy or pharmacologic therapy may be warranted, if clinically indicated.  MYOCARDIAL PERFUSION IMAGING STUDY (LEXISCAN ) performed on 11/14/2021 Pharmacological myocardial perfusion imaging study with no significant ischemia Small region apical thinning likely secondary to attenuation artifact Normal wall motion, EF estimated at 67% No EKG changes concerning for ischemia at peak stress or in recovery. CT attenuation correction images unavailable Low risk scan  LEFT HEART CATHETERIZATION AND CORONARY ANGIOGRAPHY performed on 04/28/2017 No angiographically significant coronary artery disease. Severely reduced LV contraction (LVEF 25-30%) with mid  and apical akinesis, consistent with Takotsubo (stress-induced) cardiomyopathy. Mildly elevated left ventricular filling pressure. Recommendations: Medical therapy, including initiation of metoprolol  succinate 12.5 mg daily and furosemide  20 mg IV daily.  If blood pressure tolerates, consider addition of low-dose ACE inhibitor tomorrow. Continue treatment of pain and opioid withdrawal.    IMPRESSION AND PLAN: REILY TRELOAR has been referred for pre-anesthesia review and clearance prior to her undergoing the planned anesthetic and procedural courses. Available labs, pertinent testing, and imaging results were personally reviewed by me in preparation for upcoming operative/procedural course. [facility] medical record has been updated following extensive record review and patient interview with PAT staff.   This patient has been appropriately cleared by cardiology with an overall ACCEPTABLE risk of patient experiencing significant perioperative cardiovascular complications. Based on clinical review performed today (02/09/24),  barring any significant acute changes in the patient's overall condition, it is anticipated that she will be able to proceed with the planned surgical intervention. Any acute changes in clinical condition may necessitate her procedure being postponed and/or cancelled. Patient will meet with anesthesia team (MD and/or CRNA) on the day of her procedure for preoperative evaluation/assessment. Questions regarding anesthetic course will be fielded at that time.   Pre-surgical instructions were reviewed with the patient during his PAT appointment, and questions were fielded to satisfaction by PAT clinical staff. She has been instructed on which medications that she will need to hold prior to surgery, as well as the ones that have been deemed safe/appropriate to take on the day of her procedure. As part of the general education provided by PAT, patient made aware both verbally and in writing, that she would need to abstain from the use of any illegal substances during her perioperative course. She was advised that failure to follow the provided instructions could necessitate case cancellation or result in serious perioperative complications up to and including death. Patient encouraged to contact PAT and/or her surgeon's office to discuss any questions or concerns that may arise prior to surgery; verbalized understanding.   Dorise Pereyra, MSN, APRN, FNP-C, CEN [facility]  Perioperative Services Nurse Practitioner Phone: [phone] Fax: [phone] 02/09/24 2:19 PM  NOTE: This note has been prepared using Dragon dictation software. Despite my best ability to proofread, there is always the potential that unintentional transcriptional errors may still occur from this process.

## 2024-02-09 NOTE — Telephone Encounter (Signed)
 Patient presents today after MRI to verify pump is still running and did not stall.  Alert reports that it did stall but recovered during MRI.  Jordan Chan and myself interrogated.  Patient left ambulatory to home.

## 2024-02-10 ENCOUNTER — Encounter: Payer: Self-pay | Admitting: Pain Medicine

## 2024-02-10 NOTE — Discharge Instructions (Signed)

## 2024-02-10 NOTE — H&P (Signed)
 Please see 02/01/2024 H&P note.

## 2024-02-11 ENCOUNTER — Ambulatory Visit
Admission: RE | Admit: 2024-02-11 | Discharge: 2024-02-11 | Disposition: A | Discharge status: Home / Self Care | Attending: Pain Medicine | Admitting: Pain Medicine

## 2024-02-11 ENCOUNTER — Ambulatory Visit: Payer: Self-pay | Admitting: Urgent Care

## 2024-02-11 ENCOUNTER — Other Ambulatory Visit: Payer: Self-pay

## 2024-02-11 ENCOUNTER — Encounter
Admission: RE | Disposition: A | Discharge status: Home / Self Care | Payer: Self-pay | Source: Home / Self Care | Attending: Pain Medicine

## 2024-02-11 ENCOUNTER — Encounter: Payer: Self-pay | Admitting: Pain Medicine

## 2024-02-11 DIAGNOSIS — N183 Chronic kidney disease, stage 3 unspecified: Secondary | ICD-10-CM | POA: Insufficient documentation

## 2024-02-11 DIAGNOSIS — Z923 Personal history of irradiation: Secondary | ICD-10-CM | POA: Insufficient documentation

## 2024-02-11 DIAGNOSIS — I700 Atherosclerosis of aorta: Secondary | ICD-10-CM | POA: Diagnosis not present

## 2024-02-11 DIAGNOSIS — Z87891 Personal history of nicotine dependence: Secondary | ICD-10-CM | POA: Diagnosis not present

## 2024-02-11 DIAGNOSIS — F419 Anxiety disorder, unspecified: Secondary | ICD-10-CM | POA: Insufficient documentation

## 2024-02-11 DIAGNOSIS — Z8673 Personal history of transient ischemic attack (TIA), and cerebral infarction without residual deficits: Secondary | ICD-10-CM | POA: Insufficient documentation

## 2024-02-11 DIAGNOSIS — Z7901 Long term (current) use of anticoagulants: Secondary | ICD-10-CM | POA: Diagnosis not present

## 2024-02-11 DIAGNOSIS — Z853 Personal history of malignant neoplasm of breast: Secondary | ICD-10-CM | POA: Insufficient documentation

## 2024-02-11 DIAGNOSIS — Z9221 Personal history of antineoplastic chemotherapy: Secondary | ICD-10-CM | POA: Insufficient documentation

## 2024-02-11 DIAGNOSIS — J4489 Other specified chronic obstructive pulmonary disease: Secondary | ICD-10-CM | POA: Insufficient documentation

## 2024-02-11 DIAGNOSIS — G894 Chronic pain syndrome: Secondary | ICD-10-CM | POA: Insufficient documentation

## 2024-02-11 DIAGNOSIS — I480 Paroxysmal atrial fibrillation: Secondary | ICD-10-CM | POA: Diagnosis not present

## 2024-02-11 DIAGNOSIS — E1142 Type 2 diabetes mellitus with diabetic polyneuropathy: Secondary | ICD-10-CM | POA: Diagnosis present

## 2024-02-11 DIAGNOSIS — I130 Hypertensive heart and chronic kidney disease with heart failure and stage 1 through stage 4 chronic kidney disease, or unspecified chronic kidney disease: Secondary | ICD-10-CM | POA: Diagnosis not present

## 2024-02-11 DIAGNOSIS — G4733 Obstructive sleep apnea (adult) (pediatric): Secondary | ICD-10-CM | POA: Diagnosis not present

## 2024-02-11 DIAGNOSIS — Z9981 Dependence on supplemental oxygen: Secondary | ICD-10-CM | POA: Diagnosis not present

## 2024-02-11 DIAGNOSIS — E1122 Type 2 diabetes mellitus with diabetic chronic kidney disease: Secondary | ICD-10-CM | POA: Diagnosis not present

## 2024-02-11 DIAGNOSIS — I5032 Chronic diastolic (congestive) heart failure: Secondary | ICD-10-CM | POA: Insufficient documentation

## 2024-02-11 DIAGNOSIS — Z978 Presence of other specified devices: Secondary | ICD-10-CM

## 2024-02-11 DIAGNOSIS — Z86711 Personal history of pulmonary embolism: Secondary | ICD-10-CM | POA: Diagnosis not present

## 2024-02-11 DIAGNOSIS — F1120 Opioid dependence, uncomplicated: Secondary | ICD-10-CM | POA: Diagnosis present

## 2024-02-11 DIAGNOSIS — Z79899 Other long term (current) drug therapy: Secondary | ICD-10-CM | POA: Diagnosis not present

## 2024-02-11 DIAGNOSIS — F32A Depression, unspecified: Secondary | ICD-10-CM | POA: Diagnosis not present

## 2024-02-11 DIAGNOSIS — Z462 Encounter for fitting and adjustment of other devices related to nervous system and special senses: Secondary | ICD-10-CM | POA: Diagnosis present

## 2024-02-11 DIAGNOSIS — G8929 Other chronic pain: Secondary | ICD-10-CM | POA: Diagnosis present

## 2024-02-11 DIAGNOSIS — E1140 Type 2 diabetes mellitus with diabetic neuropathy, unspecified: Secondary | ICD-10-CM | POA: Diagnosis not present

## 2024-02-11 DIAGNOSIS — E119 Type 2 diabetes mellitus without complications: Secondary | ICD-10-CM

## 2024-02-11 HISTORY — DX: Other cerebrovascular disease: I67.89

## 2024-02-11 HISTORY — DX: Dependence on supplemental oxygen: Z99.81

## 2024-02-11 HISTORY — DX: Chronic kidney disease, stage 3 unspecified: N18.30

## 2024-02-11 HISTORY — DX: Other cervical disc degeneration, unspecified cervical region: M50.30

## 2024-02-11 HISTORY — DX: Type 2 diabetes mellitus without complications: E11.9

## 2024-02-11 HISTORY — DX: Takotsubo syndrome: I51.81

## 2024-02-11 HISTORY — PX: PAIN PUMP REVISION: SHX6231

## 2024-02-11 HISTORY — DX: Long term (current) use of anticoagulants: Z79.01

## 2024-02-11 HISTORY — DX: Unspecified diastolic (congestive) heart failure: I50.30

## 2024-02-11 HISTORY — DX: Paroxysmal atrial fibrillation: I48.0

## 2024-02-11 HISTORY — DX: Other specified dorsopathies, cervical region: M53.82

## 2024-02-11 HISTORY — DX: Opioid use, unspecified with withdrawal: F11.93

## 2024-02-11 HISTORY — DX: Type 2 diabetes mellitus without complications: Z79.4

## 2024-02-11 HISTORY — DX: Chronic pain syndrome: G89.4

## 2024-02-11 LAB — GLUCOSE, CAPILLARY
Glucose-Capillary: 161 mg/dL — ABNORMAL HIGH (ref 70–99)
Glucose-Capillary: 140 mg/dL — ABNORMAL HIGH (ref 70–99)

## 2024-02-11 SURGERY — PAIN PUMP REVISION
Anesthesia: Monitor Anesthesia Care | Site: Abdomen

## 2024-02-11 MED ORDER — SODIUM CHLORIDE (PF) 0.9 % IJ SOLN
INTRAMUSCULAR | Status: AC
  Filled 2024-02-11: qty 100

## 2024-02-11 MED ORDER — MIDAZOLAM HCL 2 MG/2ML IJ SOLN
INTRAMUSCULAR | Status: AC
  Filled 2024-02-11: qty 2

## 2024-02-11 MED ORDER — SEVOFLURANE IN SOLN
RESPIRATORY_TRACT | Status: AC
  Filled 2024-02-11: qty 250

## 2024-02-11 MED ORDER — CEFAZOLIN SODIUM-DEXTROSE 1-4 GM/50ML-% IV SOLN
INTRAVENOUS | Status: DC | PRN
  Administered 2024-02-11: 1 g via INTRAVENOUS

## 2024-02-11 MED ORDER — PROPOFOL 1000 MG/100ML IV EMUL
INTRAVENOUS | Status: AC
  Filled 2024-02-11: qty 100

## 2024-02-11 MED ORDER — LIDOCAINE HCL (PF) 2 % IJ SOLN
INTRAMUSCULAR | Status: DC | PRN
  Administered 2024-02-11: 60 mg via INTRADERMAL

## 2024-02-11 MED ORDER — SODIUM CHLORIDE 0.9 % IV SOLN: INTRAVENOUS | Status: DC

## 2024-02-11 MED ORDER — FENTANYL CITRATE (PF) 100 MCG/2ML IJ SOLN
INTRAMUSCULAR | Status: DC | PRN
  Administered 2024-02-11: 50 ug via INTRAVENOUS

## 2024-02-11 MED ORDER — BACITRACIN ZINC 500 UNIT/GM EX OINT
TOPICAL_OINTMENT | CUTANEOUS | Status: AC
  Filled 2024-02-11: qty 28.35

## 2024-02-11 MED ORDER — 0.9 % SODIUM CHLORIDE (POUR BTL) OPTIME
TOPICAL | Status: DC | PRN
  Administered 2024-02-11: 500 mL

## 2024-02-11 MED ORDER — LIDOCAINE HCL (PF) 1 % IJ SOLN
INTRAMUSCULAR | Status: AC
  Filled 2024-02-11: qty 30

## 2024-02-11 MED ORDER — FENTANYL CITRATE (PF) 100 MCG/2ML IJ SOLN
INTRAMUSCULAR | Status: AC
  Filled 2024-02-11: qty 2

## 2024-02-11 MED ORDER — BUPIVACAINE HCL (PF) 0.25 % IJ SOLN
INTRAMUSCULAR | Status: AC
  Filled 2024-02-11: qty 30

## 2024-02-11 MED ORDER — PROPOFOL 500 MG/50ML IV EMUL
INTRAVENOUS | Status: DC | PRN
Start: 2024-02-11 — End: 2024-02-11
  Administered 2024-02-11: 150 ug/kg/min via INTRAVENOUS

## 2024-02-11 MED ORDER — MIDAZOLAM HCL 2 MG/2ML IJ SOLN
INTRAMUSCULAR | Status: DC | PRN
  Administered 2024-02-11: 1 mg via INTRAVENOUS

## 2024-02-11 MED ORDER — LIDOCAINE HCL (PF) 1 % IJ SOLN
INTRAMUSCULAR | Status: DC | PRN
  Administered 2024-02-11: 8 mL via INTRAMUSCULAR
  Administered 2024-02-11: 10 mL via INTRAMUSCULAR

## 2024-02-11 SURGICAL SUPPLY — 44 items
BENZOIN TINCTURE PRP APPL 2/3 (GAUZE/BANDAGES/DRESSINGS) ×1 IMPLANT
BINDER ABDOMINAL 12 ML 46-62 (SOFTGOODS) ×1 IMPLANT
BINDER ABDOMINAL 9 SM 30-45 (SOFTGOODS) IMPLANT
BLADE SURG 15 STRL LF DISP TIS (BLADE) ×1 IMPLANT
CLAMP SUTURE YELLOW 5 PAIRS (MISCELLANEOUS) ×1 IMPLANT
CNTNR URN SCR LID CUP LEK RST (MISCELLANEOUS) ×1 IMPLANT
DRAPE C-ARM XRAY 36X54 (DRAPES) ×1 IMPLANT
DRAPE INCISE IOBAN 66X45 STRL (DRAPES) ×1 IMPLANT
DRSG TEGADERM 4X4.75 (GAUZE/BANDAGES/DRESSINGS) ×2 IMPLANT
DRSG TELFA 3X8 NADH STRL (GAUZE/BANDAGES/DRESSINGS) ×2 IMPLANT
DURAPREP 26ML APPLICATOR (WOUND CARE) ×1 IMPLANT
ELECTRODE REM PT RTRN 9FT ADLT (ELECTROSURGICAL) ×1 IMPLANT
GAUZE 4X4 16PLY ~~LOC~~+RFID DBL (SPONGE) ×1 IMPLANT
GLOVE BIO SURGEON STRL SZ8 (GLOVE) ×1 IMPLANT
GLOVE SURG XRAY 8.5 LX (GLOVE) ×1 IMPLANT
GOWN STRL REUS W/ TWL LRG LVL3 (GOWN DISPOSABLE) ×1 IMPLANT
GOWN STRL REUS W/ TWL XL LVL3 (GOWN DISPOSABLE) ×1 IMPLANT
KIT REFILL CATH SYNCHROMED II (MISCELLANEOUS) IMPLANT
KIT TURNOVER KIT A (KITS) ×1 IMPLANT
LABEL OR SOLS (LABEL) ×1 IMPLANT
MANIFOLD NEPTUNE II (INSTRUMENTS) ×1 IMPLANT
NDL HYPO 25X1 1.5 SAFETY (NEEDLE) ×2 IMPLANT
NDL SPNL 22GX3.5 QUINCKE BK (NEEDLE) ×1 IMPLANT
NEEDLE HYPO 25X1 1.5 SAFETY (NEEDLE) ×2 IMPLANT
NEEDLE SPNL 22GX3.5 QUINCKE BK (NEEDLE) ×1 IMPLANT
NS IRRIG 500ML POUR BTL (IV SOLUTION) IMPLANT
PACK BASIN MINOR ARMC (MISCELLANEOUS) ×1 IMPLANT
PACK UNIVERSAL (MISCELLANEOUS) ×1 IMPLANT
POUCH TYRX ANTIBAC NEURO LRG (Mesh General) IMPLANT
PUMP SYNCHROMED III 40ML RESVR (Neuro Prosthesis/Implant) IMPLANT
SET SPINAL AND EPIDURAL ANES (NEEDLE) ×1 IMPLANT
SOLUTION PREP PVP 2OZ (MISCELLANEOUS) ×1 IMPLANT
SPIKE FLUID TRANSFER (MISCELLANEOUS) ×3 IMPLANT
STAPLER SKIN PROX 35W (STAPLE) ×1 IMPLANT
SUT PROLENE 0 CT 2 (SUTURE) IMPLANT
SUT SILK 0 SH 30 (SUTURE) ×1 IMPLANT
SUT SILK 2 0 SH (SUTURE) ×4 IMPLANT
SUT VIC AB 2-0 SH 27XBRD (SUTURE) ×2 IMPLANT
SYR 10ML LL (SYRINGE) ×2 IMPLANT
SYR 3ML 18GX1 1/2 (SYRINGE) IMPLANT
SYR EPIDURAL 5ML GLASS (SYRINGE) ×1 IMPLANT
SYR TB 1ML LL NO SAFETY (SYRINGE) ×1 IMPLANT
TRAP FLUID SMOKE EVACUATOR (MISCELLANEOUS) ×1 IMPLANT
WATER STERILE IRR 500ML POUR (IV SOLUTION) ×1 IMPLANT

## 2024-02-11 NOTE — Anesthesia Postprocedure Evaluation (Signed)
 Anesthesia Post Note  Patient: Jordan Chan  Procedure(s) Performed: PAIN PUMP REPLACEMENT (Abdomen)  Patient location during evaluation: PACU Anesthesia Type: General Level of consciousness: awake and alert Pain management: pain level controlled Vital Signs Assessment: post-procedure vital signs reviewed and stable Respiratory status: spontaneous breathing, nonlabored ventilation, respiratory function stable and patient connected to nasal cannula oxygen  Cardiovascular status: blood pressure returned to baseline and stable Postop Assessment: no apparent nausea or vomiting Anesthetic complications: no   No notable events documented.   Last Vitals:  Vitals:   02/11/24 0915 02/11/24 0924  BP: 129/67 127/67  Pulse: 74 73  Resp: 14 18  Temp: (!) 36.1 C 37 C  SpO2: 100% 98%    Last Pain:  Vitals:   02/11/24 0924  TempSrc: Temporal  PainSc: 0-No pain                 Debby Mines

## 2024-02-11 NOTE — Interval H&P Note (Signed)
 Patient's Name: Jordan Chan  MRN: [id number]  Referring Provider: No ref. provider found  DOB: May 04, 1951  PCP: [name], NP  DOS: 12/30/2023  Note by: Eric DELENA Como, MD  Service setting: Outpatient Same Day Surgery  Specialty: Interventional Pain Management  Patient type: Established  Location: [facility]  Encounter type: Pre-operative Evaluation   Admission Date & Time: 02/11/2024  5:59 AM  History and Physical Interval Note:  Jordan Chan has presented today for surgery, with the diagnosis of : End of battery life of intrathecal infusion pump.  I have reviewed the patient's chart, labs, images, history and physical exam. No new additional findings. No change in status. Jordan Chan is stable for surgery. The various alternatives of treatment have been discussed with the patient and family. The patient has been informed of the risks and possible complications. After consideration of risks, benefits and other options for treatment, the patient has consented to Procedure(s): PAIN PUMP REVISION: 62350 (CPT) , as a surgical intervention . All questions have been answered to the patient's satisfaction.  Plan: Proceed with surgery as planned.  OR Scheduled Time: 0715  Note by: Eric DELENA Como, MD Date: 12/30/2023; Time: 7:23 AM

## 2024-02-11 NOTE — Brief Op Note (Signed)
 Patient: Jordan Chan  (73 y.o. female)  Date: 02/11/2024  Time: 8:45 AM  Diagnosis Pre-op: LOW BACK PAIN Post-op: s/p LOW BACK PAIN Procedure: PAIN PUMP REPLACEMENT: 62350 (CPT)  Operating Room: [facility] OR ROOM 02  Surgeon: Tanya Glisson, MD   Assistant(s): None  Anesthesia: Anesthesia type not filed in the log.  Anesthesia Staff:  Anesthesiologist: Leavy Ned, MD CRNA: Jarvis Lew, CRNA EBL:  Minimal  Blood Administered: None Drains: None Meds ordered this encounter  Medications   0.9 %  sodium chloride  infusion   0.9 % irrigation (POUR BTL)   bupivacaine  (PF) (MARCAINE ) 30 mL, lidocaine  (PF) (XYLOCAINE ) 1 % 30 mL   Specimen & disposition: * No specimens in log * Complete Count: YES Tourniquet: * No tourniquets in log *  Dictation: Note written in EPIC Plan of Care: Discharge to 01-Home or Self Care after PACU  Patient Disposition: F/U at Pain Clinic as outpatient. Implants     Intraocular Lens     Lens Iol Diop 21.0 - [id number] - Implanted   (Right)    Inventory item: LENS IOL TECNIS ITEC 21.0 Model/Cat number: [id number]   Serial number: [id number] Manufacturer: [facility] [employer]    As of 05/19/2019     Status: Implanted                Lens Iol Diop 20.5 - [id number] - Implanted   (Left)    Inventory item: LENS IOL TECNIS ITEC 20.5 Model/Cat number: [id number]   Serial number: [id number] Manufacturer: [facility] [employer]    As of 06/16/2019     Status: Implanted                     Mesh General     Pouch Tyrx Antibac Neuro Lrg - [id number] - Implanted   (Right) Abdomen    Inventory item: JUSTUS JUDITHE BELTS NEURO LRG Model/Cat number: [id number]   Manufacturer: [employer] NEUROMOD PAIN MGMT Lot number: [id number]    As of 02/11/2024     Status: Implanted                     Neuro Prosthesis/Implant     Pump Synchromed III 40ml Resvr - [id number] - Implanted   (Right) Abdomen    Inventory  item: PUMP SYNCHROMED III RESVR Model/Cat number: [id number]   Serial number: [id number] Manufacturer: [employer] NEUROMOD PAIN MGMT    As of 02/11/2024     Status: Implanted                     Pump     Syncromed II - Implanted      Model/Cat number: [employer]      As of 02/09/2024     Status: Implanted

## 2024-02-11 NOTE — Op Note (Addendum)
 PROVIDER NOTE: Interpretation of information contained herein should be left to medically-trained personnel. Specific patient instructions are provided elsewhere under Patient Instructions section of medical record. This document was created in part using AI and STT-dictation technology, any transcriptional errors that may result from this process are unintentional.  Patient: Jordan Chan  Service: Surgical Intervention  PCP: [name], NP  DOB: 1951-04-01  DOS: 12/30/2023  02/11/2024  Provider: Eric DELENA Como, MD  MRN: [id number]  Delivery: Face-to-face  Specialty: Interventional Pain Management  Type: Established Patient  Setting: Outpatient Same Day Surgery  Specialty designation: 09  Referring Prov.: No ref. provider found  Location: [facility] OR ROOM 02       Reason for Admission: Ambulatory Surgery for management of Chronic Pain Syndrome (Level of risk: Moderate to High) Admission Date & Time: 02/11/2024  5:59 AM  Operative Report:  Anesthesia:  Date of Surgery: 02/11/2024 Pre-op Diagnosis: LOW BACK PAIN Surgery: PAIN PUMP REPLACEMENT Revision/Removal of battery-depleted intrathecal programmable pump reservoir. Replacement of  intrathecal programmable pump. Revision/Removal of implanted intrathecal catheter. Replacement of  of implanted intrathecal catheter. Intraoperative analysis and programming of the intrathecal pump. Intraoperative reservoir refill. Post-op Diagnosis: s/p LOW BACK PAIN  Type: No value filed. (Anesthesia type not filed in the log.) Staff:  Anesthesiologist: Leavy Ned, MD CRNA: Jarvis Lew, CRNA  Sedation: Meaningful verbal contact was maintained during the critical portions of the procedure  Local/Regional Analgesia: Local anesthetic infiltration by Surgeon Ray DELENA Como, MD) Local Anesthetic: Bupivacaine  0.5% + Lidocaine  1.0% in a 50:50 Mix Indication(s): Analgesia  Position: Prone Target: Neurostimulator Battery and Lead(s)        Lumbar       Approach:      Contralateral approach.  Area Prepped: Entire                   Prepping solution: ChloraPrep (2% chlorhexidine  gluconate and 70% isopropyl alcohol ) Region:         IDDS (Intrathecal Drug Delivery System):  Reservoir  Battery:  Manufacturer: [employer]  Model: Synchromed III Model No.: [id number]  Serial No.: [id number]  Delivery Route: Intrathecal  Type: Programmable  Volume Capacity (mL): 40 mL reservoir  Priming Volume: N/A  Calibration Constant: [id number]  MRI compatibility: Conditional  Implant Site: Abdominal (RLQ)  Laterality: Right  Content:  1ry Medication Class: Opioid 1ry Medication (Concentration): PF-Morphine  (5.0 mg/mL) 2ry Medication (Concentration): PF-Bupivacaine  (20.0 mg/mL) 3ry Medication (Concentration): PF-Clonidine  (100.0 mcg/mL)  PTM parameters:  Mode: Off/Inactive Bolus Dose: N/A Duration: N/A (min) Max. No.: N/A (bolus/day). Lock-out time (interval): N/A (hrs) Max. Daily bolus dose: N/A (mg/day) Total max. Daily dose (basal + bolus): N/A (mg/day)  Catheter: Manufacturer: [employer] Model:  InDura catheter Model No.: 8709 Lot No.: N/A  Implanted Length (cm): 77.5 cm  Catheter Volume (mL): 0.171 mL  Delivery Route: Intrathecal  Canal Access Site: T1-2  Tip Level: T7 Tip Location: Posterior Tip Laterality: Right  Additional equipment: TYRX Antibacterial envelope (Minocycline/rifampin)(REF: [id number]  LOT: [id number]  ED: 2024/10/01)  Visiting Personnel: * No visitors entered *  Surgical Indication(s):   Indications: s/p LOW BACK PAIN  Pain Score: Pre-operative: 0-No pain/10 Post-operative: 0-No pain/10  H&P (Pre-op Assessment):  Jordan Chan is a 73 y.o. (year old), female patient, seen today for intrathecal pump implant surgery. She  has a past surgical history that includes Abdominal hysterectomy (1987); Elbow arthroscopy with tendon reconstruction; morphine  pump (2011); Plantar fascia release;  Esophagogastroduodenoscopy (egd) with propofol  (N/A, 01/01/2017); Colonoscopy with propofol  (N/A, 01/01/2017);  LEFT HEART CATH AND CORONARY ANGIOGRAPHY (N/A, 04/28/2017); Mastectomy (Right, 06/1997); Back surgery; Breast surgery (Right); Cardiac catheterization; Trigger finger release; Cataract extraction w/PHACO (Right, 05/19/2019); Intrathecal pump implant; Coccyx removal; Cataract extraction w/PHACO (Left, 06/16/2019); Colonoscopy with propofol  (N/A, 12/11/2021); Esophagogastroduodenoscopy (egd) with propofol  (N/A, 12/11/2021); and Total hip arthroplasty (Left).  Initial Vital Signs:  Pulse/HCG Rate: 79ECG Heart Rate: 76 Temp: 97.8 F (36.6 C) Resp: 16 BP: 123/62 SpO2: 96 %  BMI: Estimated body mass index is 29.68 kg/m as calculated from the following:   Height as of this encounter: 4' 10 (1.473 m).   Weight as of this encounter: 64.4 kg.  Risk Assessment: Allergies: Reviewed. She is allergic to other, pain patch [menthol], jardiance  [empagliflozin ], prednisone , avelox  [moxifloxacin  hcl in nacl], doxycycline , erythromycin , fentanyl , moxifloxacin  hcl, oxycontin [oxycodone], and ozempic [semaglutide].  Allergy Precautions: None required Coagulopathies: Reviewed. None identified.  Blood-thinner therapy: None at this time Active Infection(s): Reviewed. None identified. Jordan Chan is afebrile  Site Confirmation: Jordan Chan was asked to confirm the procedure and laterality before marking the site, which she did.  Procedure checklist: Completed  Consent: Before the procedure and under the influence of no sedative(s), amnesic(s), or anxiolytics, the patient was informed of the treatment options, risks and possible complications. To fulfill our ethical and legal obligations, as recommended by the American Medical Association's Code of Ethics, I have informed the patient of my clinical impression; the nature and purpose of the treatment or procedure; the risks, benefits, and possible  complications of the intervention; the alternatives, including doing nothing; the risk(s) and benefit(s) of the alternative treatment(s) or procedure(s); and the risk(s) and benefit(s) of doing nothing.  Jordan Chan was provided with information about the general risks and possible complications associated with most interventional procedures. These include, but are not limited to: failure to achieve desired goals, infection, bleeding, organ or nerve damage, allergic reactions, paralysis, and/or death.  In addition, she was informed of those risks and possible complications associated to this particular procedure, which include, but are not limited to: damage to the implant; mechanical failure of the implant; failure to decrease pain; local, systemic, or serious CNS infections, intraspinal abscess with possible cord compression and paralysis, or life-threatening such as meningitis; intrathecal and/or epidural bleeding with formation of hematoma with possible spinal cord compression and permanent paralysis; organ damage; nerve injury or damage with subsequent sensory, motor, and/or autonomic system dysfunction, resulting in transient or permanent pain, numbness, and/or weakness of one or several areas of the body; allergic reactions, either minor or major life-threatening, such as anaphylactic or anaphylactoid reactions. She was also informed of the possibility of unforseen future implant recall due to currently unknown problems. She was reminded that mechanical all devices fail with time, requiring further interventional therapies that may, or may not, be performed by me.  Furthermore, Ms. Scheffler was informed of those risks and complications associated with the medications. These include, but are not limited to: allergic reactions (i.e.: anaphylactic or anaphylactoid reactions; or chemical meningitis); arrhythmia;  Hypotension/hypertension; cardiovascular collapse; respiratory depression and/or shortness of  breath; swelling or edema; medication-induced neural toxicity; intrathecal granulomas; particulate matter embolism and blood vessel occlusion with resultant organ, and/or nervous system infarction and permanent paralysis; and/or unpredictable catastrophic complication or reaction leading to permanent damage or death. She was also informed of the possible off-label use of intrathecal medications for analgesic purposes.  Finally, she was informed that Medicine is not an exact science; therefore, there is also the possibility of unforeseen or unpredictable  risks and/or possible complications that may result in a catastrophic outcome. The patient indicated having understood very clearly. We have given the patient no guarantees and we have made no promises. Enough time was given to the patient to ask questions, all of which were answered to the patient's satisfaction. Ms. Casella has indicated that she wanted to continue with the procedure.  Attestation: I, the ordering provider, attest that I have discussed with the patient the benefits, risks, side-effects, alternatives, likelihood of achieving goals, and potential problems during recovery for the procedure that I have provided informed consent. Date  Time: 02/11/2024  5:59 AM  Pre-Procedure Preparation:  Monitoring: As per Anesthesia protocol. Respiration, ETCO2, SpO2, BP, heart rate and rhythm monitor placed and checked for adequate function Safety Precautions: Patient was assessed for positional comfort and pressure points before starting the procedure. Time-out: I initiated and conducted the Time-out before starting the procedure, as per protocol. The patient was asked to participate by confirming the accuracy of the Time Out information. Verification of the correct person, site, and procedure were performed and confirmed by me, the nursing staff, and the patient. Time-out conducted as per Joint Commission's Universal Protocol (UP.01.01.01). Admit  time: 02/11/2024  5:59 AM  Description of Procedure Process:   Times:  Scheduled start: 0715 Procedure:  7:30 AM Incision: 1 Hr 15 Min 0 Sec  Description of procedure: The procedure site was prepped using a broad-spectrum topical antiseptic. The area was then draped in the usual and standard manner. Time-out was performed as per JC Universal Protocol (UP.01.01.01). Pump replacement implant: The old pump was identified and the anchors removed. Care was taken not to damage the catheter. Meanwhile, the new pump was emptied from its original content and refilled with the desired medication. The new pump was then interrogated, programmed, and primed. The catheter was then disconnected from the old pump and immediately connected to the new one, after having aspirated and confirmed that it was still patent. The new pump was inserted into the pocket after having cleaned it. The excess catheter was place behind the pump and the pump was secured to the abdominal wall using 2-0 non-absorbable sutures. A suture was placed in all four corners of the pump, so as to prevent migration or flipping of the pump. The wound was checked for adequate hemostasis, irrigated, and cleaned prior to closure. The wound was approximated using 2-0 Vicryl. Great care was taken, so as to avoid puncturing the intrathecal catheter. The skin was closed using surgical staples. The wound was covered with a sterile gauze, for absorption, and a bio-occlusive dressing. An abdominal binder was placed to minimize fluid accumulation or development of a hematoma. The patient was then transferred to the post-anesthesia recovery unit, where final analysis and programming of the device was done. The patient tolerated the entire procedure well. A repeat set of vitals were taken after the procedure and the patient was kept under observation until discharge criteria was met. The patient was provided with discharge instructions, including a section on how to  identify potential problems. Should any problems arise concerning this procedure, the patient was given instructions to immediately contact us , without hesitation. The [employer]'s representative and I, both provided the patient with our Business cards containing our contact telephone numbers, and instructed the patient to contact either one of us , at any time, should there be any problems or questions. In any case, we plan to contact the patient by telephone for a follow-up status report regarding this interventional  procedure.  Incision:   EBL: 2 mL Complications: * No complications entered in OR log *  OR Staff:  Circulator: Metro GLENWOOD Vannie Dutch JONETTA, RN; Gershon Randall LABOR, RN Scrub Person: Kerry Cordella HERO Vendor Representative : Natalia Douglas  Safety Precautions: Informed consent obtained. Patient allergies reviewed. Time-out was performed as per JC Universal Protocol (UP.01.01.01). Strict sterile technique kept at all times. Fluoroscopy guidance used for placement accuracy and confirmation. Continuous Anesthesia monitoring maintained throughout the entire case. Pressure points and patient comfort assessed during initial patient positioning. Meaningful verbal contact maintained at all times during the critical portions of the procedure. Aspiration looking for blood return was conducted prior to all injections. At no point did we inject any substances, as a needle was being advanced. No attempts were made at seeking any paresthesias. Safe injection practices and needle disposal techniques used. Medications properly checked for expiration dates. SDV (single dose vial) medications used. Adequate hemostasis attained before wound closure.  Vitals:   02/11/24 0622 02/11/24 0851  BP: 123/62 119/62  Pulse: 79 74  Resp: 16 13  Temp: 97.8 F (36.6 C) (!) 96.8 F (36 C)  TempSrc: Temporal   SpO2: 96% 100%  Weight: 64.4 kg   Height: 4' 10 (1.473 m)    Materials:   Procedure Equipment  Tray:  Type: Implant Tray (See Hospital logs for specific details)  Procedural Needle(s):  Type: Spinal needle(s) (Quincke) Gauge: 22-G Length: 3.5-in  Medication(s): Administrations occurring from 0530 to 0845 on 02/11/24:  Medication Name Total Dose  0.9 % irrigation (POUR BTL) 500 mL  bupivacaine  (PF) (MARCAINE ) 30 mL, lidocaine  (PF) (XYLOCAINE ) 1 % 30 mL 18 mL  0.9 %  sodium chloride  infusion 531.25 mL    Imaging Guidance (Spinal):         Type of Imaging Technique: Fluoroscopy Guidance (Spinal) Indication(s): Fluoroscopy guidance for needle placement to enhance accuracy in procedures requiring precise needle localization for targeted delivery of medication in or near specific anatomical locations not easily accessible without such real-time imaging assistance. Exposure Time: Please see nurses notes. Contrast: None used. Fluoroscopic Guidance: I was personally present during the use of fluoroscopy. Tunnel Vision Technique used to obtain the best possible view of the target area. Parallax error corrected before commencing the procedure. Direction-depth-direction technique used to introduce the needle under continuous pulsed fluoroscopy. Once target was reached, antero-posterior, oblique, and lateral fluoroscopic projection used confirm needle placement in all planes. Images permanently stored in EMR. Interpretation: No contrast injected. I personally interpreted the imaging intraoperatively. Adequate needle placement confirmed in multiple planes. Permanent images saved into the patient's record.  Antibiotic Prophylaxis:   Antibiotics Given (last 72 hours)     None      Indication(s): None identified  Post-operative Assessment:  Post-procedure Vital Signs:  Pulse/HCG Rate: 7476 Temp: (!) 96.8 F (36 C) Resp: 13 BP: 119/62 SpO2: 100 %  Complications: No immediate post-treatment complications observed by team, or reported by patient.  Note: The patient tolerated the  entire procedure well. A repeat set of vitals were taken after the procedure and the patient was kept under observation following institutional policy, for this type of procedure. Post-procedural neurological assessment was performed, showing return to baseline, prior to discharge. The patient was provided with post-procedure discharge instructions, including a section on how to identify potential problems. Should any problems arise concerning this procedure, the patient was given instructions to immediately contact us , at any time, without hesitation. In any case, we plan to contact  the patient by telephone for a follow-up status report regarding this interventional procedure.  Comments:  No additional relevant information.  Plan of Care  Disposition: Discharge home under the care of a responsible, capable, adult driver. Return to clinics in 6-10 days for post-procedure evaluation and removal of staples.  Discharge (Date  Time): No discharge date for patient encounter.  Primary Care Physician: [name], Jolene T, NP Location: [facility] Note by: Eric DELENA Como, MD (TTS technology used. I apologize for any typographical errors that were not detected and corrected.)  Disclaimer:  Medicine is not an Visual merchandiser. The only guarantee in medicine is that nothing is guaranteed. It is important to note that the decision to proceed with this intervention was based on the information collected from the patient. The Data and conclusions were drawn from the patient's questionnaire, the interview, and the physical examination. Because the information was provided in large part by the patient, it cannot be guaranteed that it has not been purposely or unconsciously manipulated. Every effort has been made to obtain as much relevant data as possible for this evaluation. It is important to note that the conclusions that lead to this procedure are derived in large part from the  available data. Always take into account that the treatment will also be dependent on availability of resources and existing treatment guidelines, considered by other Pain Management Practitioners as being common knowledge and practice, at the time of the intervention. For Medico-Legal purposes, it is also important to point out that variation in procedural techniques and pharmacological choices are the acceptable norm. The indications, contraindications, technique, and results of the above procedure should only be interpreted and judged by a Board-Certified Interventional Pain Specialist with extensive familiarity and expertise in the same exact procedure and technique.

## 2024-02-11 NOTE — Transfer of Care (Signed)
 Immediate Anesthesia Transfer of Care Note  Patient: Jordan Chan  Procedure(s) Performed: PAIN PUMP REPLACEMENT (Abdomen)  Patient Location: PACU  Anesthesia Type:General  Level of Consciousness: awake and alert   Airway & Oxygen  Therapy: Patient Spontanous Breathing and Patient connected to face mask oxygen   Post-op Assessment: Report given to RN and Post -op Vital signs reviewed and stable  Post vital signs: Reviewed and stable  Last Vitals:  Vitals Value Taken Time  BP 119/62 02/11/24 08:48  Temp 96.8 02/11/24 0851  Pulse 74 02/11/24 08:50  Resp 13 02/11/24 08:50  SpO2 100 % 02/11/24 08:50  Vitals shown include unfiled device data.  Last Pain:  Vitals:   02/11/24 0622  TempSrc: Temporal         Complications: No notable events documented.

## 2024-02-11 NOTE — OR Nursing (Addendum)
 Pain pump was placed 02/11/24 at 0807 by Dr. Eric Como;  pain meds consisting of BUPIVACAINE  HCL 20MG /CLONIDINE  HCL 100MCG/MORPHINE  SULFATE 5MG /ML PF (TOTAL OF )  placed in pump at same time 0807.

## 2024-02-11 NOTE — Anesthesia Preprocedure Evaluation (Addendum)
 Anesthesia Evaluation  Patient identified by MRN, date of birth, ID band Patient awake    Reviewed: Allergy & Precautions, H&P , NPO status , reviewed documented beta blocker date and time   Airway Mallampati: III  TM Distance: <3 FB Neck ROM: full    Dental  (+) Upper Dentures, Lower Dentures   Pulmonary shortness of breath and Long-Term Oxygen  Therapy, asthma , sleep apnea and Oxygen  sleep apnea , pneumonia, resolved, COPD,  oxygen  dependent, former smoker 2L/min nightly    + decreased breath sounds      Cardiovascular Exercise Tolerance: Good hypertension, + Past MI, +CHF and + Orthopnea  + dysrhythmias Atrial Fibrillation  Rhythm:Regular Rate:Normal  1. Left ventricular ejection fraction, by estimation, is 40 to 45%. The  left ventricle has mildly decreased function. The left ventricle has no  regional wall motion abnormalities. Left ventricular diastolic parameters  are consistent with Grade I  diastolic dysfunction (impaired relaxation).  2. Right ventricular systolic function is normal. The right ventricular  size is normal. Tricuspid regurgitation signal is inadequate for assessing  PA pressure.  3. The mitral valve is normal in structure. No evidence of mitral valve  regurgitation. No evidence of mitral stenosis.  4. The aortic valve is normal in structure. Aortic valve regurgitation is  not visualized. No aortic stenosis is present.  5. The inferior vena cava is normal in size with greater than 50%  respiratory variability, suggesting right atrial pressure of 3 mmHg.    Neuro/Psych  PSYCHIATRIC DISORDERS  Depression    CVA, No Residual Symptoms    GI/Hepatic ,GERD  Medicated and Controlled,,  Endo/Other  diabetes    Renal/GU Renal disease     Musculoskeletal   Abdominal   Peds  Hematology  (+) Blood dyscrasia, anemia   Anesthesia Other Findings Past Medical History: No date: Arthritis No date:  Asthma 1998: Breast cancer (HCC)     Comment:  right breast ca with mastectomy and chemotherapy and               radiation No date: Bronchitis No date: CHF (congestive heart failure) (HCC)     Comment:  with Morphine  withdrawal No date: COPD (chronic obstructive pulmonary disease) (HCC) No date: Diabetes mellitus without complication (HCC) No date: Diverticulitis     Comment:  diverticulosis also No date: Dyspnea No date: Endometriosis No date: GERD (gastroesophageal reflux disease) 2000-2005: History of shingles No date: Hypercholesteremia No date: Hypertension No date: IBS (irritable bowel syndrome) No date: Low back pain     Comment:  a. Implanted morphine /bupivicaine/clonidine  pump. No date: Neuropathy No date: Orthopnea No date: Oxygen  dependent     Comment:  uses at night No date: Personal history of chemotherapy No date: Personal history of radiation therapy No date: Pneumonia     Comment:  pneumonia 5-6 times, history of bronchitis also No date: Scoliosis No date: Sleep apnea     Comment:  does not use cpap 2010: Stroke [facility])     Comment:  TIA, 10 years ago No date: Withdrawal from sedative drug (HCC)     Comment:  withdrawal from morphine  when pump batteries died  Past Surgical History: 1987: ABDOMINAL HYSTERECTOMY No date: BACK SURGERY     Comment:  Tailbone removed following fracture No date: BREAST SURGERY; Right     Comment:  mastectomy No date: CARDIAC CATHETERIZATION 05/19/2019: CATARACT EXTRACTION W/PHACO; Right     Comment:  Procedure: CATARACT EXTRACTION PHACO AND INTRAOCULAR  LENS PLACEMENT (IOC), RIGHT, DIABETIC;  Surgeon: Ferol Rogue, MD;  Location: [facility] ORS;  Service: Ophthalmology;               Laterality: Right;  Lot # [id number] H US : 00:43.8 CDE:               4.59 No date: COCCYX REMOVAL 01/01/2017: COLONOSCOPY WITH PROPOFOL ; N/A     Comment:  Procedure: COLONOSCOPY WITH PROPOFOL ;  Surgeon: Therisa Bi, MD;  Location: [facility] ENDOSCOPY;  Service:               Endoscopy;  Laterality: N/A; No date: ELBOW ARTHROSCOPY WITH TENDON RECONSTRUCTION 01/01/2017: ESOPHAGOGASTRODUODENOSCOPY (EGD) WITH PROPOFOL ; N/A     Comment:  Procedure: ESOPHAGOGASTRODUODENOSCOPY (EGD) WITH               PROPOFOL ;  Surgeon: Therisa Bi, MD;  Location: [facility]               ENDOSCOPY;  Service: Endoscopy;  Laterality: N/A; No date: INTRATHECAL PUMP IMPLANT 04/28/2017: LEFT HEART CATH AND CORONARY ANGIOGRAPHY; N/A     Comment:  Procedure: LEFT HEART CATH AND CORONARY ANGIOGRAPHY;                Surgeon: Mady Bruckner, MD;  Location: [facility] INVASIVE               CV LAB;  Service: Cardiovascular;  Laterality: N/A; 06/1997: MASTECTOMY; Right 2011: morphine  pump No date: PLANTAR FASCIA RELEASE No date: TRIGGER FINGER RELEASE  BMI    Body Mass Index: 28.68 kg/m      Reproductive/Obstetrics                              Anesthesia Physical Anesthesia Plan  ASA: 4  Anesthesia Plan: General   Post-op Pain Management: Minimal or no pain anticipated   Induction: Intravenous  PONV Risk Score and Plan: 3 and Propofol  infusion, TIVA and Ondansetron   Airway Management Planned: Nasal Cannula  Additional Equipment: None  Intra-op Plan:   Post-operative Plan:   Informed Consent: I have reviewed the patients History and Physical, chart, labs and discussed the procedure including the risks, benefits and alternatives for the proposed anesthesia with the patient or authorized representative who has indicated his/her understanding and acceptance.     Dental advisory given  Plan Discussed with: CRNA and Surgeon  Anesthesia Plan Comments: (Discussed risks of anesthesia with patient, including possibility of difficulty with spontaneous ventilation under anesthesia necessitating airway intervention, PONV, and rare risks such as cardiac or respiratory or neurological events, and allergic  reactions. Discussed the role of CRNA in patient's perioperative care. Patient understands.)        Anesthesia Quick Evaluation

## 2024-02-12 ENCOUNTER — Telehealth: Payer: Self-pay | Admitting: Pain Medicine

## 2024-02-12 NOTE — Telephone Encounter (Signed)
 Called Jordan Chan /[employer] and he is unaware of the binder that was placed. I walked over to the OR and talked with nurse and she helped me and gave me a smaller binder. Patient is 4' 10 and they stated they only had 2 sizes and they may have given her the larger one. I was given the smallest binder. Called [employer] back and she is unable to come to pain management to received binder r/t company at the house and living far away. Patients daughter coming to get binder and also will give an ace wrap if needed. Patient with understanding and understands what the binder is needed for.

## 2024-02-12 NOTE — Telephone Encounter (Signed)
 PT called stated that she has procedure on yesterday and have some questions. Please give patient a call. TY

## 2024-02-12 NOTE — Telephone Encounter (Signed)
 Called patient and she was in tears. She states that MD told her yesterday after IT was placed that she needed to wear a binder that was given to her to . She states that it is too big and is rubbing her bad  on legs and under breast. She is wanting to take it off.  Will talk with our nurse advisor and call patient  back.

## 2024-02-15 ENCOUNTER — Encounter: Payer: Self-pay | Admitting: Pain Medicine

## 2024-02-17 ENCOUNTER — Ambulatory Visit

## 2024-02-17 ENCOUNTER — Telehealth: Payer: Self-pay | Admitting: Family

## 2024-02-17 DIAGNOSIS — E538 Deficiency of other specified B group vitamins: Secondary | ICD-10-CM

## 2024-02-17 NOTE — Progress Notes (Signed)
 Patient is in office today for a nurse visit for B12 Injection. Patient Injection was given in the  Right deltoid. Patient tolerated injection well.

## 2024-02-17 NOTE — Telephone Encounter (Signed)
 Called to confirm/remind patient of their appointment at the [facility] on 02/18/24.   Appointment:   [] Confirmed  [] Left mess   [] No answer/No voice mail  [x] VM Full/unable to leave message  [] Phone not in service  Patient reminded to bring all medications and/or complete list.  Confirmed patient has transportation. Gave directions, instructed to utilize valet parking.

## 2024-02-17 NOTE — Progress Notes (Deleted)
 [facility] Note     PCP: [name], NP Cardiologist: Lonni Hanson, MD  Chief Complaint:    HPI:  Jordan Chan is a 73 y/o female with a history of B12 deficiency, chronic pain syndrome s/p intrathecal pump, paroxysmal atrial fibrillation, stress-induced cardiomyopathy in the setting of pain pump malfunction and opioid withdrawal (2018), HTN, hyperlipidemia, PE (2021), DM, COPD, torticollis, GERD, OSA, breast cancer, chronic back pain,anemia and IBS.   Echo 12/11/20: EF 45% with Grade II DD Echo 09/11/21: EF 25-30% with Grade II DD Echo 10/31/21: EF 40-45% with Grade I DD  Admitted 04/15/23 due to worsening of left hip pain at the surgical site associated with increasing redness about the left hip surgical wound. Tachycardic. WBC 17.6. CXR normal.Thought to be septic due to left hip surgical wound infection. Treated with IV fluids, analgesics and empiric IV antibiotics. [facility]red to [facility].    Admitted 08/09/23 due to acute respiratory distress found with sats in the 50 to 60% on RA. Found to have multiple PE's and sepsis due to aspiration pneumonia. Initially intubated. Completed antibiotics & successfully extubated. Underwent swallowing evaluation. Heparin  stopped and transitioned to eliquis . Anemia stable. Echo 08/11/23: EF 25-30% with Grade I DD  Seen in [facility] 06/25 where metoprolol  was increased.   Was in the ED 01/28/24 with shortness of breath. Also had an episode of nausea and chest pressure which had resolved by ED presentation. Had frequent episodes of having BM during this time. Reassuring vital signs and exam. 2 negative troponins and a nonischemic EKG.   Had her pain pump replaced 02/11/24.  She presents today for a HF follow-up visit with a chief complaint of   Wearing oxygen  at 2L around the clock. Quit smoking ~2006 when a grandchild was going to be born. Denies alcohol  use. Not adding salt to her food. Weighing daily    Previous cardiac studies:  LHC 04/28/17:  No angiographically significant coronary artery disease. Severely reduced LV contraction (LVEF 25-30%) with mid and apical akinesis, consistent with Takotsubo (stress-induced) cardiomyopathy. Mildly elevated left ventricular filling pressure.  ROS: All systems negative except what is listed in HPI, PMH and Problem List  Past Medical History:  Diagnosis Date   (HFpEF) heart failure with preserved ejection fraction (HCC)    Anemia    Anxiety    Aortic atherosclerosis (HCC)    Arthritis    Asthma    Bilateral pulmonary embolism (small subsegmental) 08/11/2023   Breast cancer, right (HCC) 1998   a.) Tx'd with surgical excision + chemotherapy + XRT   Bronchitis    Cerebral microvascular disease    Chronic pain syndrome    CKD (chronic kidney disease), stage III (HCC)    COPD (chronic obstructive pulmonary disease) (HCC)    DDD (degenerative disc disease), cervical    Decreased ROM of intervertebral discs of cervical spine    Dependence on nocturnal oxygen  therapy (2L/[place])    Depression    Diabetes mellitus type 2, insulin  dependent (HCC)    Diverticulitis    Dyspnea    Endometriosis    GERD (gastroesophageal reflux disease)    History of shingles    Hypercholesteremia    Hypertension    IBS (irritable bowel syndrome)    Low back pain    a.) has intrathecal pump (morphine /bupivicaine/clonidine )   Neuropathy    On apixaban  therapy    Opioid withdrawal (HCC)    a.) experiences withdrawal symptoms when batteries in intrathecal pump reach  EOL   Orthopnea    PAF (paroxysmal atrial fibrillation) (HCC)    a.) CHA2DS2VASc = 8 (age, sex, HFpEF, HTN, TIA/PE x 2, vascular disease, T2DM) as of metoprolol  succinate; b.) rate/rhythm maintained on oral metoprolol  succinate; chronically anticoagulated with apixaban    Personal history of chemotherapy    Personal history of radiation therapy    Pneumonia    Scoliosis    Sleep apnea    a.) no  nocturnal PAP therapy; requires supplemental oxygen  at bedtime   Status post bilateral cataract extraction 2020   Takotsubo cardiomyopathy    TIA (transient ischemic attack) 2010    Current Outpatient Medications  Medication Sig Dispense Refill   ACCU-CHEK AVIVA PLUS test strip TEST THREE TIMES DAILY 300 strip 3   Accu-Chek Softclix Lancets lancets TEST BLOOD SUGAR THREE TIMES DAILY 300 each 3   albuterol  (PROVENTIL ) (2.5 MG/3ML) 0.083% nebulizer solution Take 3 mLs (2.5 mg total) by nebulization every 4 (four) hours as needed for wheezing or shortness of breath. 75 mL 1   albuterol  (VENTOLIN  HFA) 108 (90 Base) MCG/ACT inhaler Inhale 2 puffs into the lungs every 6 (six) hours as needed for wheezing or shortness of breath. 1 each 1   alum & mag hydroxide-simeth (MAALOX/MYLANTA) 200-200-20 MG/5ML suspension Take 30 mLs by mouth every 6 (six) hours as needed for indigestion or heartburn. 300 mL 1   apixaban  (ELIQUIS ) 5 MG TABS tablet Take 1 tablet (5 mg total) by mouth 2 (two) times daily. 180 tablet 3   atorvastatin  (LIPITOR) 40 MG tablet Take 1 tablet (40 mg total) by mouth daily. 90 tablet 3   Blood Glucose Monitoring Suppl (TRUE METRIX METER) w/Device KIT Use to check blood sugar 4 times a day 1 kit 4   busPIRone  (BUSPAR ) 5 MG tablet Take 1 tablet (5 mg total) by mouth 2 (two) times daily. 30 tablet 1   cholecalciferol  (VITAMIN D3) 25 MCG (1000 UNIT) tablet Take 1,000 Units by mouth daily.     Continuous Glucose Receiver (FREESTYLE LIBRE 2 READER) DEVI To check blood sugars 3-4 times daily due to insulin  use. DX E11.40 2 each 5   Continuous Glucose Sensor (FREESTYLE LIBRE 2 SENSOR) MISC USE 1 SENSOR TO CHECK BLOOD SUGARS 3-4 TIMES DAILY DUE TO INSULIN  USE 2 each 5   Cyanocobalamin  1000 MCG/ML KIT Inject 1,000 mcg as directed every 30 (thirty) days. Next injection 3/21     cyclobenzaprine  (FLEXERIL ) 5 MG tablet Take 5 mg by mouth daily as needed for muscle spasms.     dapagliflozin  propanediol  (FARXIGA ) 10 MG TABS tablet Take 1 tablet (10 mg total) by mouth daily before breakfast. 90 tablet 3   docusate sodium  (COLACE) 100 MG capsule Take 1 capsule (100 mg total) by mouth 2 (two) times daily as needed for mild constipation. 30 capsule 0   Dulaglutide  (TRULICITY ) 4.5 MG/0.5ML SOAJ Inject 4.5 mg into the skin once a week. 12 mL 3   ferrous sulfate  325 (65 FE) MG EC tablet Take 1 tablet (325 mg total) by mouth 2 (two) times daily. 60 tablet 1   folic acid  (FOLVITE ) 1 MG tablet TAKE 1 TABLET EVERY DAY 90 tablet 0   furosemide  (LASIX ) 40 MG tablet Take 0.5 tablets (20 mg total) by mouth daily. 30 tablet 1   gabapentin  (NEURONTIN ) 400 MG capsule Take 1 capsule (400 mg total) by mouth 3 (three) times daily. 90 capsule 12   insulin  lispro (HUMALOG  KWIKPEN) 100 UNIT/ML KwikPen Inject 5 Units into  the skin 3 (three) times daily before meals. Do not inject insulin  if you are not going to eat meal.  Only take before a meal. 15 mL 3   metFORMIN  (GLUCOPHAGE ) 1000 MG tablet Take 1 tablet (1,000 mg total) by mouth 2 (two) times daily with a meal. 180 tablet 3   metoprolol  succinate (TOPROL -XL) 50 MG 24 hr tablet Take 1 tablet (50 mg total) by mouth daily. Take with or immediately following a meal. 90 tablet 3   montelukast  (SINGULAIR ) 10 MG tablet Take 1 tablet (10 mg total) by mouth at bedtime. 90 tablet 3   naloxone  (NARCAN ) nasal spray 4 mg/0.1 mL Place 1 spray into the nose as needed for up to 365 doses (for opioid-induced respiratory depresssion). In case of emergency (overdose), spray once into each nostril. If no response within 3 minutes, repeat application and call 911. 1 each 1   Nebulizers (EASY AIR COMPRESSOR NEBULIZER) MISC Use to inhaler nebulizer treatments at needed per instructions on nebulizer prescription 1 each 0   ondansetron  (ZOFRAN ) 4 MG tablet Take 1 tablet (4 mg total) by mouth daily as needed for nausea or vomiting. 30 tablet 1   OXYGEN  Inhale 2 L into the lungs at bedtime.      PAIN MANAGEMENT INTRATHECAL, IT, PUMP 1 each by Intrathecal route. Intrathecal (IT) medication:  Morphine  5.0 mg/ml, Bupivacaine  20.0 mg/ml,  Clonidine  100.0 mcg/ml Patient does not remember current. Adjusted every 3 months at [facility]     pantoprazole  (PROTONIX ) 40 MG tablet Take 1 tablet (40 mg total) by mouth daily. 90 tablet 3   polyethylene glycol (MIRALAX  / GLYCOLAX ) 17 g packet Take 17 g by mouth daily as needed for moderate constipation. 10 packet 0   TRELEGY ELLIPTA  100-62.5-25 MCG/ACT AEPB INHALE 1 PUFF INTO THE LUNGS DAILY . 42 DAY EXPIRATION AFTER OPENING FOIL TRAY 180 each 3   TRESIBA  FLEXTOUCH 100 UNIT/ML FlexTouch Pen Inject 55 Units into the skin at bedtime. 30 mL 5   venlafaxine  XR (EFFEXOR -XR) 150 MG 24 hr capsule Take 1 capsule (150 mg total) by mouth daily. 90 capsule 3   Current Facility-Administered Medications  Medication Dose Route Frequency Provider Last Rate Last Admin   cyanocobalamin  (VITAMIN B12) injection 1,000 mcg  1,000 mcg Intramuscular Q30 days [name], Jolene T, NP   1,000 mcg at 02/17/24 1048    Allergies  Allergen Reactions   Other Palpitations    IV steroids   Pain Patch [Menthol] Anaphylaxis   Jardiance  [Empagliflozin ] Other (See Comments)    Yeast infection & dry mouth   Prednisone     Avelox  [Moxifloxacin  Hcl In Nacl] Other (See Comments)    Upset stomach   Doxycycline  Diarrhea   Erythromycin  Nausea Only and Other (See Comments)    Has used azithromycin  without documented ADRs   Fentanyl  Nausea Only and Rash   Moxifloxacin  Hcl Other (See Comments)    Upset stomach   Oxycontin [Oxycodone] Hives   Ozempic [Semaglutide] Nausea Only      Social History   Socioeconomic History   Marital status: Married    Spouse name: Darina   Number of children: 2   Years of education: Not on file   Highest education level: High school graduate  Occupational History   Occupation: retired  Tobacco Use   Smoking status: Former    Current  packs/day: 0.00    Average packs/day: 1 pack/day for 30.0 years (30.0 ttl pk-yrs)    Types: Cigarettes  Start date: 04/30/1974    Quit date: 04/30/2004    Years since quitting: 19.8    Passive exposure: Past   Smokeless tobacco: Never  Vaping Use   Vaping status: Never Used  Substance and Sexual Activity   Alcohol  use: No   Drug use: No   Sexual activity: Not Currently    Birth control/protection: Post-menopausal  Other Topics Concern   Not on file  Social History Narrative   ** Merged History Encounter ** ** Merged History Encounter **       Lives in home with mentally handicap son   Social Drivers of Corporate investment banker Strain: Low Risk  (01/29/2024)   Received from [facility]   Overall Financial Resource Strain (CARDIA)    Difficulty of Paying Living Expenses: Not hard at all  Food Insecurity: No Food Insecurity (01/29/2024)   Received from [facility]   Hunger Vital Sign    Within the past 12 months, you worried that your food would run out before you got the money to buy more.: Never true    Within the past 12 months, the food you bought just didn't last and you didn't have money to get more.: Never true  Transportation Needs: No Transportation Needs (01/29/2024)   Received from [facility] - Transportation    In the past 12 months, has lack of transportation kept you from medical appointments or from getting medications?: No    Lack of Transportation (Non-Medical): No  Physical Activity: Inactive (12/29/2023)   Exercise Vital Sign    Days of Exercise per Week: 0 days    Minutes of Exercise per Session: 0 min  Stress: No Stress Concern Present (12/29/2023)   [employer] of Occupational Health - Occupational Stress Questionnaire    Feeling of Stress : Only a little  Social Connections: Moderately Isolated (12/29/2023)   Social Connection and Isolation Panel    Frequency of Communication with  Friends and Family: More than three times a week    Frequency of Social Gatherings with Friends and Family: More than three times a week    Attends Religious Services: More than 4 times per year    Active Member of [employer] or Organizations: No    Attends Banker Meetings: Never    Marital Status: Separated  Intimate Partner Violence: Not At Risk (12/29/2023)   Humiliation, Afraid, Rape, and Kick questionnaire    Fear of Current or Ex-Partner: No    Emotionally Abused: No    Physically Abused: No    Sexually Abused: No      Family History  Problem Relation Age of Onset   Cancer Mother 31       lung   Thyroid  disease Mother    Cancer Father 68       Pancreatic   Hypertension Sister    Cancer Sister        cancer on face   Hyperlipidemia Sister    Osteoporosis Maternal Grandmother    Cancer Paternal Grandmother        colon   Arthritis Paternal Grandfather    Hyperlipidemia Son    Seizures Son    Breast cancer Neg Hx       PHYSICAL EXAM:  General: Well appearing. No resp difficulty HEENT: normal Neck: supple, no JVD, head is being pulled to the left due to torticollis Cor: Regular rhythm, rate. No rubs, gallops or murmurs Lungs: clear Abdomen:  soft, nontender, nondistended. Extremities: no cyanosis, clubbing, rash, edema Neuro: alert & oriented X 3. Moves all 4 extremities w/o difficulty. Affect pleasant    ECG: not done   ASSESSMENT & PLAN:  1: NICM with reduced ejection fraction- - suspect due to stress induced after pain pump malfunction & opioid withdrawal in 2018 - NYHA Chan II - euvolemic - weighing daily; reminded to call for overnight weight gain of > 2 pounds or weekly weight gain of > 5 pounds - weight 146 pounds from last visit here 5 weeks ago - Echo 12/11/20: EF 45% with Grade II DD - Echo 09/11/21: EF 25-30% with Grade II DD - Echo 10/31/21: EF 40-45% with Grade I DD - Echo 08/11/23: EF 25-30% with Grade I DD - continue farxiga  10mg   daily - continue furosemide  20mg  daily - continue losartan  12.5mg  daily; discussed changing to entresto  24/26mg  BID - continue metoprolol  succinate 50mg  daily - potassium has been too high for MRA - not adding salt to her food - BNP 08/09/23 was 241.9  2: HTN- - BP  - saw PCP Felton) 05/25 - BMET 01/28/24 reviewed: sodium 139, potassium 3.8, creatinine 1.06 & GFR 55  3: DM- - A1c 06/22/23 was 7.4% - continue insulin  - continue metformin  1000mg  BID  4: COPD- - saw pulmonology Alica) 07/25 - wearing oxygen  at 2L around the clock but does take it off at times at home  5: Atrial fibrillation- - saw cardiology (End) 11/24 - continue apixaban  5mg  BID - continue metoprolol  succinate 25mg  daily  6: Chronic pain- - follows with pain clinic - has chronic intrathecal pain pump which was replaced 02/11/24 - continue gabapentin  400mg  TID  7: Anemia- - Hg 01/28/24 reviewed and was 10.9 - saw GI Elodie) 03/25 - continue ferrous sulfate  325mg  BID      Jordan DELENA Class, FNP 02/17/24

## 2024-02-18 ENCOUNTER — Encounter: Admitting: Family

## 2024-02-18 ENCOUNTER — Telehealth: Payer: Self-pay | Admitting: Family

## 2024-02-18 MED FILL — Medication: INTRATHECAL | Qty: 1 | Status: AC

## 2024-02-18 NOTE — Telephone Encounter (Signed)
 Patient did not show for her Heart Failure Clinic appointment on 02/18/24.

## 2024-02-19 ENCOUNTER — Other Ambulatory Visit: Payer: Self-pay

## 2024-02-19 NOTE — Patient Outreach (Signed)
 Complex Care Management   Visit Note  02/19/2024  Name:  Jordan Chan MRN: [id number] DOB: November 21, 1950  Situation: Referral received for Complex Care Management related to Heart Failure and Diabetes I obtained verbal consent from Patient.  Visit completed with Jordan Chan via telephone.  Background:   Past Medical History:  Diagnosis Date   (HFpEF) heart failure with preserved ejection fraction (HCC)    Anemia    Anxiety    Aortic atherosclerosis (HCC)    Arthritis    Asthma    Bilateral pulmonary embolism (small subsegmental) 08/11/2023   Breast cancer, right (HCC) 1998   a.) Tx'd with surgical excision + chemotherapy + XRT   Bronchitis    Cerebral microvascular disease    Chronic pain syndrome    CKD (chronic kidney disease), stage III (HCC)    COPD (chronic obstructive pulmonary disease) (HCC)    DDD (degenerative disc disease), cervical    Decreased ROM of intervertebral discs of cervical spine    Dependence on nocturnal oxygen  therapy (2L/[place])    Depression    Diabetes mellitus type 2, insulin  dependent (HCC)    Diverticulitis    Dyspnea    Endometriosis    GERD (gastroesophageal reflux disease)    History of shingles    Hypercholesteremia    Hypertension    IBS (irritable bowel syndrome)    Low back pain    a.) has intrathecal pump (morphine /bupivicaine/clonidine )   Neuropathy    On apixaban  therapy    Opioid withdrawal (HCC)    a.) experiences withdrawal symptoms when batteries in intrathecal pump reach EOL   Orthopnea    PAF (paroxysmal atrial fibrillation) (HCC)    a.) CHA2DS2VASc = 8 (age, sex, HFpEF, HTN, TIA/PE x 2, vascular disease, T2DM) as of metoprolol  succinate; b.) rate/rhythm maintained on oral metoprolol  succinate; chronically anticoagulated with apixaban    Personal history of chemotherapy    Personal history of radiation therapy    Pneumonia    Scoliosis    Sleep apnea    a.) no nocturnal PAP therapy; requires supplemental oxygen  at  bedtime   Status post bilateral cataract extraction 2020   Takotsubo cardiomyopathy    TIA (transient ischemic attack) 2010      Assessment: Patient Reported Symptoms: Cognitive Cognitive Status: Alert and oriented to person, place, and time, Normal speech and language skills Cognitive/Intellectual Conditions Management [RPT]: None reported or documented in medical history or problem list Health Maintenance Behaviors: Annual physical exam Healing Pattern: Average Health Facilitated by: Pain control, Rest  Neurological Neurological Review of Symptoms: No symptoms reported  HEENT HEENT Symptoms Reported: No symptoms reported  Cardiovascular Cardiovascular Symptoms Reported: No symptoms reported  Respiratory Respiratory Symptoms Reported: No symptoms reported  Endocrine Endocrine Symptoms Reported: No symptoms reported Is patient diabetic?: Yes Is patient checking blood sugars at home?: Yes List most recent blood sugar readings, include date and time of day: Reports fasting readings have been within range  Gastrointestinal Gastrointestinal Symptoms Reported: No symptoms reported  Genitourinary Genitourinary Symptoms Reported: No symptoms reported  Integumentary Integumentary Symptoms Reported: No symptoms reported  Musculoskeletal Musculoskelatal Symptoms Reviewed: Unsteady gait Additional Musculoskeletal Details: Currently completing Physical Therapy at [facility] twice a week. Reports significant improvements with range of motion.  Psychosocial Psychosocial Symptoms Reported: No symptoms reported Quality of Family Relationships: supportive Do you feel physically threatened by others?: No      02/19/2024    2:52 PM  Depression screen PHQ 2/9  Decreased Interest 0  Down,  Depressed, Hopeless 0  PHQ - 2 Score 0    There were no vitals filed for this visit.  Medications Reviewed Today     Reviewed by Karoline Lima, RN (Registered Nurse) on 02/20/24 at  1908  Med List Status: <None>   Medication Order Taking? Sig Documenting Provider Last Dose Status Informant  ACCU-CHEK AVIVA PLUS test strip [id number]  TEST THREE TIMES DAILY [name], Jolene T, NP  Active Self           Med Note DARRICK, CRYSTAL L   Mon Aug 24, 2023  9:57 AM) Uses Libre device   Accu-Chek Softclix Lancets lancets [id number]  TEST BLOOD SUGAR THREE TIMES DAILY [name], Jolene T, NP  Active   albuterol  (PROVENTIL ) (2.5 MG/3ML) 0.083% nebulizer solution [id number]  Take 3 mLs (2.5 mg total) by nebulization every 4 (four) hours as needed for wheezing or shortness of breath. Kadali, Renuka A, MD  Active            Med Note (LLOYD, CRYSTAL L   Mon Aug 24, 2023 10:07 AM) [place] recently can't find neb machine   albuterol  (VENTOLIN  HFA) 108 (90 Base) MCG/ACT inhaler [id number] No Inhale 2 puffs into the lungs every 6 (six) hours as needed for wheezing or shortness of breath. Kadali, Renuka A, MD Past Month Active   alum & mag hydroxide-simeth (MAALOX/MYLANTA) 200-200-20 MG/5ML suspension [id number] No Take 30 mLs by mouth every 6 (six) hours as needed for indigestion or heartburn. Kadali, Renuka A, MD Past Month Active   apixaban  (ELIQUIS ) 5 MG TABS tablet [id number] No Take 1 tablet (5 mg total) by mouth 2 (two) times daily. [name], Jolene T, NP 02/07/2024 Active   atorvastatin  (LIPITOR) 40 MG tablet [id number] No Take 1 tablet (40 mg total) by mouth daily. Valerio Moris T, NP 02/10/2024 Noon Active   Blood Glucose Monitoring Suppl (TRUE METRIX METER) w/Device KIT [id number]  Use to check blood sugar 4 times a day [name], Jolene T, NP  Active Self  busPIRone  (BUSPAR ) 5 MG tablet [id number] No Take 1 tablet (5 mg total) by mouth 2 (two) times daily. Kadali, Renuka A, MD 02/11/2024 Morning Active   cholecalciferol  (VITAMIN D3) 25 MCG (1000 UNIT) tablet [id number] No Take 1,000 Units by mouth daily. [provider] Past Week Active Self  Continuous Glucose Receiver (FREESTYLE LIBRE 2 READER)  DEVI [id number]  To check blood sugars 3-4 times daily due to insulin  use. DX E11.40 Valerio Moris T, NP  Active Self  Continuous Glucose Sensor (FREESTYLE LIBRE 2 SENSOR) MISC [id number]  USE 1 SENSOR TO CHECK BLOOD SUGARS 3-4 TIMES DAILY DUE TO INSULIN  USE [name], Jolene T, NP  Active   cyanocobalamin  (VITAMIN B12) injection 1,000 mcg [id number]   [name], Jolene T, NP  Active   Cyanocobalamin  1000 MCG/ML KIT [id number]  Inject 1,000 mcg as directed every 30 (thirty) days. Next injection 3/21 [provider]  Active Self  cyclobenzaprine  (FLEXERIL ) 5 MG tablet [id number] No Take 5 mg by mouth daily as needed for muscle spasms. [provider] Past Month Active   dapagliflozin  propanediol (FARXIGA ) 10 MG TABS tablet [id number] No Take 1 tablet (10 mg total) by mouth daily before breakfast. Donette Ellouise LABOR, FNP 02/07/2024 Active   docusate sodium  (COLACE) 100 MG capsule [id number] No Take 1 capsule (100 mg total) by mouth 2 (two) times daily as needed for mild constipation. Kadali, Renuka A, MD Past Month Active   Dulaglutide  (TRULICITY ) 4.5 MG/0.5ML SOAJ [id number] No Inject 4.5 mg  into the skin once a week. [name], Jolene T, NP 02/04/2024 Active   ferrous sulfate  325 (65 FE) MG EC tablet [id number] No Take 1 tablet (325 mg total) by mouth 2 (two) times daily. Kadali, Renuka A, MD Past Week Active   folic acid  (FOLVITE ) 1 MG tablet [id number] No TAKE 1 TABLET EVERY DAY Dasie Tinnie MATSU, NP Past Week Active Self  furosemide  (LASIX ) 40 MG tablet [id number] No Take 0.5 tablets (20 mg total) by mouth daily. Kadali, Renuka A, MD 02/07/2024 Active   gabapentin  (NEURONTIN ) 400 MG capsule [id number] No Take 1 capsule (400 mg total) by mouth 3 (three) times daily. [name], Jolene T, NP 02/11/2024 Morning Active   insulin  lispro (HUMALOG  KWIKPEN) 100 UNIT/ML KwikPen [id number] No Inject 5 Units into the skin 3 (three) times daily before meals. Do not inject insulin  if you are not going to eat meal.  Only take  before a meal. [name], Jolene T, NP 02/10/2024 Active   metFORMIN  (GLUCOPHAGE ) 1000 MG tablet [id number] No Take 1 tablet (1,000 mg total) by mouth 2 (two) times daily with a meal. [name], Jolene T, NP 02/08/2024 Active   metoprolol  succinate (TOPROL -XL) 50 MG 24 hr tablet [id number] No Take 1 tablet (50 mg total) by mouth daily. Take with or immediately following a meal. Donette Ellouise LABOR, [place] 02/10/2024 Noon Active   montelukast  (SINGULAIR ) 10 MG tablet [id number] No Take 1 tablet (10 mg total) by mouth at bedtime. [name], Jolene T, NP 02/09/2024 Bedtime Active   naloxone  (NARCAN ) nasal spray 4 mg/0.1 mL [id number]  Place 1 spray into the nose as needed for up to 365 doses (for opioid-induced respiratory depresssion). In case of emergency (overdose), spray once into each nostril. If no response within 3 minutes, repeat application and call 911. Naveira, Francisco, MD  Active   Nebulizers (EASY AIR COMPRESSOR NEBULIZER) MISC [id number]  Use to inhaler nebulizer treatments at needed per instructions on nebulizer prescription [name], Jolene T, NP  Active Self  ondansetron  (ZOFRAN ) 4 MG tablet [id number] No Take 1 tablet (4 mg total) by mouth daily as needed for nausea or vomiting. Cyrena Mylar, MD Past Month Active Self  OXYGEN  [id number]  Inhale 2 L into the lungs at bedtime. [provider]  Active            Med Note CATHY, PAULA   Tue Dec 29, 2023  1:15 PM) continuous  PAIN MANAGEMENT INTRATHECAL, IT, PUMP [id number]  1 each by Intrathecal route. Intrathecal (IT) medication:  Morphine  5.0 mg/ml, Bupivacaine  20.0 mg/ml,  Clonidine  100.0 mcg/ml Patient does not remember current. Adjusted every 3 months at [facility] [provider]  Active Self           Med Note GAYLENE DARVIN JINNY [place] Apr 16, 2023  6:23 AM) Morphine , Bupivacaine  and clonidine    pantoprazole  (PROTONIX ) 40 MG tablet [id number] No Take 1 tablet (40 mg total) by mouth daily. [name], Jolene T, NP 02/11/2024 Morning Active    polyethylene glycol (MIRALAX  / GLYCOLAX ) 17 g packet [id number] No Take 17 g by mouth daily as needed for moderate constipation. Kadali, Renuka A, MD Past Month Active   TRELEGY ELLIPTA  100-62.5-25 MCG/ACT AEPB [id number] No INHALE 1 PUFF INTO THE LUNGS DAILY . [address] DAY EXPIRATION AFTER OPENING FOIL TRAY Ormsby, Jolene T, NP 02/11/2024 Morning Active   TRESIBA  FLEXTOUCH 100 UNIT/ML FlexTouch Pen [id number] No Inject 55 Units into the skin at bedtime. Valerio Moris T, NP 02/09/2024 Active   venlafaxine  XR (  EFFEXOR -XR) 150 MG 24 hr capsule [id number] No Take 1 capsule (150 mg total) by mouth daily. [name], NP 02/10/2024 Noon Active   Med List Note Geanie Wolm HERO, RN 12/15/23 1412): 12-15-2023  Clearance to stop Eliquis  for 3 days prior to pump replacement sent to   [facility] Lucy md  kt            Recommendation:   PCP Follow-up on March 21, 2024 Specialty provider: Continue Physical Therapy sessions as scheduled Continue Current Plan of Care  Follow Up Plan:   Telephone follow up appointment with Nurse Case Manager on March 18, 2024    Jackson Acron [facility] RN Care Manager Direct Dial : [phone]  Fax: [phone] Website: [email or web address]

## 2024-02-21 NOTE — Patient Instructions (Addendum)
 Thank you for allowing the Complex Care Management team to participate in your care. It was great speaking with you!  We will follow up on March 18, 2024 at 2pm. Please do no hesitate to contact me if you require assistance prior to our next outreach.    A reminder to ALL patients/family/friends, please call the [place]  National Suicide Prevention Lifeline: (571)635-8207 or TTY: (573)804-3478 TTY (434) 557-6171) to talk to a trained counselor if you are experiencing a Mental Health or Behavioral Health Crisis or need someone to talk to.     Jackson Acron [facility] Population Health RN Care Manager Direct Dial : [phone]  Fax: [phone] Website: [email or web address]

## 2024-02-21 NOTE — Progress Notes (Unsigned)
 PROVIDER NOTE: Interpretation of information contained herein should be left to medically-trained personnel. Specific patient instructions are provided elsewhere under Patient Instructions section of medical record. This document was created in part using AI and STT-dictation technology, any transcriptional errors that may result from this process are unintentional.  Patient: Jordan Chan  Service: E/M   PCP: [name], NP  DOB: Dec 29, 1950  DOS: 02/22/2024  Provider: Eric DELENA Como, MD  MRN: [id number]  Delivery: Face-to-face  Specialty: Interventional Pain Management  Type: Established Patient  Setting: Ambulatory outpatient facility  Specialty designation: 09  Referring Prov.: [name], NP  Location: Outpatient office facility       History of present illness (HPI) Jordan Chan, a 73 y.o. year old female, is here today because of her Chronic bilateral low back pain without sciatica [M54.50, G89.29]. Jordan Chan primary complain today is No chief complaint on file.  Pertinent problems: Jordan Chan has History of breast cancer; Diabetic peripheral neuropathy (HCC); Torticollis; Cervical scoliosis; Chronic low back pain (1ry area of Pain) (Bilateral) (R>L) w/o sciatica; Chronic pain syndrome; History of sarcoma; Presence of intrathecal pump; End of battery life of intrathecal infusion pump; Status post total hip replacement, left; History of radiation therapy; History of antineoplastic chemotherapy; Chronic hip pain (Left); Abnormal MRI, lumbar spine (08/26/2019); Chronic lower extremity pain (2ry area of Pain) (Bilateral) (R>L); Chronic neck pain (3ry area of Pain) (Bilateral) (L>R); Cervicalgia; Abnormal x-ray of cervical spine (07/04/2023); DDD (degenerative disc disease), lumbosacral w/ LBP & LEP; Chronic radicular pain of lower extremity; Encounter for adjustment and management of infusion pump; Decreased range of motion of intervertebral discs of cervical  spine; and Supplemental oxygen  dependent on their pertinent problem list.  Pain Assessment: Severity of   is reported as a  /10. Location:    / . Onset:  . Quality:  . Timing:  . Modifying factor(s):  SABRA Vitals:  vitals were not taken for this visit.  BMI: Estimated body mass index is 29.68 kg/m as calculated from the following:   Height as of 02/11/24: 4' 10 (1.473 m).   Weight as of 02/11/24: 142 lb (64.4 kg).  Last encounter: 02/01/2024. Last procedure: 12/15/2023.  Reason for encounter: post-procedure evaluation and assessment.   Discussed the use of AI scribe software for clinical note transcription with the patient, who gave verbal consent to proceed.  History of Present Illness           Post-operative Assessment Intra-procedural problems/complications: None observed.         Reported side-effects: None.        Post-surgical adverse reactions or complications: None reported         Pharmacotherapy Assessment   Analgesic: morphine  sulfate powder (for intrathecal pump use)  MME/day: 2.5 mg/day (Intrathecal)   Monitoring: [place] PMP: PDMP not reviewed this encounter.       Pharmacotherapy: No side-effects or adverse reactions reported. Compliance: No problems identified. Effectiveness: Clinically acceptable.  No notes on file  UDS:  Summary  Date Value Ref Range Status  06/17/2023 Note  Final    Comment:    ==================================================================== Compliance Drug Analysis, Ur ==================================================================== Test                             Result       Flag       Units  Drug Present and Declared for Prescription Verification   Gabapentin   PRESENT      EXPECTED   Venlafaxine                     PRESENT      EXPECTED   Desmethylvenlafaxine           PRESENT      EXPECTED    Desmethylvenlafaxine is an expected metabolite of venlafaxine .    Metoprolol                      PRESENT       EXPECTED  Drug Present not Declared for Prescription Verification   Morphine                        1930         UNEXPECTED ng/mg creat    Potential sources of morphine  include administration of codeine or    morphine , use of heroin, or ingestion of poppy seeds.  Drug Absent but Declared for Prescription Verification   Hydrocodone                     Not Detected UNEXPECTED ng/mg creat   Cyclobenzaprine                 Not Detected UNEXPECTED   Acetaminophen                   Not Detected UNEXPECTED    Acetaminophen , as indicated in the declared medication list, is not    always detected even when used as directed.  ==================================================================== Test                      Result    Flag   Units      Ref Range   Creatinine              97               mg/dL      >=79 ==================================================================== Declared Medications:  The flagging and interpretation on this report are based on the  following declared medications.  Unexpected results may arise from  inaccuracies in the declared medications.   **Note: The testing scope of this panel includes these medications:   Cyclobenzaprine  (Flexeril )  Gabapentin   Hydrocodone  (Norco)  Metoprolol  (Toprol )  Venlafaxine  (Effexor )   **Note: The testing scope of this panel does not include small to  moderate amounts of these reported medications:   Acetaminophen  (Norco)   **Note: The testing scope of this panel does not include the  following reported medications:   Albuterol   Apixaban  (Eliquis )  Atorvastatin  (Lipitor)  Buspirone  (Buspar )  Dulaglutide  (Trulicity )  Fluticasone  (Trelegy)  Folic Acid   Furosemide  (Lasix )  Insulin  (Tresiba )  Losartan  (Cozaar )  Metformin   Montelukast   Ondansetron  (Zofran )  Pantoprazole  (Protonix )  Umeclidinium (Trelegy)  Vilanterol (Trelegy)  Vitamin B12  Vitamin  D3 ==================================================================== For clinical consultation, please call [phone]. ====================================================================     No results found for: CBDTHCR No results found for: D8THCCBX No results found for: D9THCCBX  ROS  Constitutional: Denies any fever or chills Gastrointestinal: No reported hemesis, hematochezia, vomiting, or acute GI distress Musculoskeletal: Denies any acute onset joint swelling, redness, loss of ROM, or weakness Neurological: No reported episodes of acute onset apraxia, aphasia, dysarthria, agnosia, amnesia, paralysis, loss of coordination, or loss of consciousness  Medication Review  Accu-Chek Softclix Lancets, Cyanocobalamin , Dulaglutide , Printmaker, Fluticasone -Umeclidin-Vilant, FreeStyle Libre 2 Reader, [employer] 2  Sensor, Oxygen -Helium, PAIN MANAGEMENT INTRATHECAL (IT) PUMP, True Metrix Meter, albuterol , alum & mag hydroxide-simeth, apixaban , atorvastatin , busPIRone , cholecalciferol , cyclobenzaprine , dapagliflozin  propanediol, docusate sodium , ferrous sulfate , folic acid , furosemide , gabapentin , glucose blood, insulin  degludec, insulin  lispro, metFORMIN , metoprolol  succinate, montelukast , naloxone , ondansetron , pantoprazole , polyethylene glycol, and venlafaxine  XR  History Review  Allergy: Jordan Chan is allergic to other, pain patch [menthol], jardiance  [empagliflozin ], prednisone , avelox  [moxifloxacin  hcl in nacl], doxycycline , erythromycin , fentanyl , moxifloxacin  hcl, oxycontin [oxycodone], and ozempic [semaglutide]. Drug: Jordan Chan  reports no history of drug use. Alcohol :  reports no history of alcohol  use. Tobacco:  reports that she quit smoking about 19 years ago. Her smoking use included cigarettes. She started smoking about 49 years ago. She has a 30 pack-year smoking history. She has been exposed to tobacco smoke. She has never used smokeless  tobacco. Social: Jordan Chan  reports that she quit smoking about 19 years ago. Her smoking use included cigarettes. She started smoking about 49 years ago. She has a 30 pack-year smoking history. She has been exposed to tobacco smoke. She has never used smokeless tobacco. She reports that she does not drink alcohol  and does not use drugs. Medical:  has a past medical history of (HFpEF) heart failure with preserved ejection fraction (HCC), Anemia, Anxiety, Aortic atherosclerosis (HCC), Arthritis, Asthma, Bilateral pulmonary embolism (small subsegmental) (08/11/2023), Breast cancer, right (HCC) (1998), Bronchitis, Cerebral microvascular disease, Chronic pain syndrome, CKD (chronic kidney disease), stage III (HCC), COPD (chronic obstructive pulmonary disease) (HCC), DDD (degenerative disc disease), cervical, Decreased ROM of intervertebral discs of cervical spine, Dependence on nocturnal oxygen  therapy (2L/[place]), Depression, Diabetes mellitus type 2, insulin  dependent (HCC), Diverticulitis, Dyspnea, Endometriosis, GERD (gastroesophageal reflux disease), History of shingles, Hypercholesteremia, Hypertension, IBS (irritable bowel syndrome), Low back pain, Neuropathy, On apixaban  therapy, Opioid withdrawal (HCC), Orthopnea, PAF (paroxysmal atrial fibrillation) (HCC), Personal history of chemotherapy, Personal history of radiation therapy, Pneumonia, Scoliosis, Sleep apnea, Status post bilateral cataract extraction (2020), Takotsubo cardiomyopathy, and TIA (transient ischemic attack) (2010). Surgical: Jordan Chan  has a past surgical history that includes Abdominal hysterectomy (1987); Elbow arthroscopy with tendon reconstruction; morphine  pump (2011); Plantar fascia release; Esophagogastroduodenoscopy (egd) with propofol  (N/A, 01/01/2017); Colonoscopy with propofol  (N/A, 01/01/2017); LEFT HEART CATH AND CORONARY ANGIOGRAPHY (N/A, 04/28/2017); Mastectomy (Right, 06/1997); Back surgery; Breast surgery (Right); Cardiac  catheterization; Trigger finger release; Cataract extraction w/PHACO (Right, 05/19/2019); Intrathecal pump implant; Coccyx removal; Cataract extraction w/PHACO (Left, 06/16/2019); Colonoscopy with propofol  (N/A, 12/11/2021); Esophagogastroduodenoscopy (egd) with propofol  (N/A, 12/11/2021); Total hip arthroplasty (Left); and Pain pump revision (N/A, 02/11/2024). Family: family history includes Arthritis in her paternal grandfather; Cancer in her paternal grandmother and sister; Cancer (age of onset: 79) in her father; Cancer (age of onset: 26) in her mother; Hyperlipidemia in her sister and son; Hypertension in her sister; Osteoporosis in her maternal grandmother; Seizures in her son; Thyroid  disease in her mother.  Laboratory Chemistry Profile   Renal Lab Results  Component Value Date   BUN 18 01/28/2024   CREATININE 1.06 (H) 01/28/2024   BCR 14 12/31/2023   GFRAA 92 07/26/2020   GFRNONAA 55 (L) 01/28/2024    Hepatic Lab Results  Component Value Date   AST 14 12/18/2023   ALT 9 12/18/2023   ALBUMIN 4.1 12/18/2023   ALKPHOS 92 12/18/2023   AMYLASE 78 03/14/2022   LIPASE 44 03/14/2023    Electrolytes Lab Results  Component Value Date   NA 139 01/28/2024   K 3.8 01/28/2024   CL 100 01/28/2024   CALCIUM  8.6 (L)  01/28/2024   MG 2.0 08/19/2023   PHOS 3.7 08/19/2023    Bone Lab Results  Component Value Date   VD25OH 56.8 09/16/2023   25OHVITD1 62 06/17/2023   25OHVITD2 <1.0 06/17/2023   25OHVITD3 62 06/17/2023    Inflammation (CRP: Acute Phase) (ESR: Chronic Phase) Lab Results  Component Value Date   CRP <1 06/17/2023   ESRSEDRATE 19 06/17/2023   LATICACIDVEN 1.0 08/10/2023         Note: Above Lab results reviewed.  Recent Imaging Review  MR CERVICAL SPINE WO CONTRAST CLINICAL DATA:  Neck pain with right-sided numbness.  EXAM: MRI CERVICAL SPINE WITHOUT CONTRAST  TECHNIQUE: Multiplanar, multisequence MR imaging of the cervical spine was performed. No intravenous  contrast was administered.  COMPARISON:  Radiography 06/17/2023  FINDINGS: Alignment: Scoliotic curvature convex to the right. Degenerative anterolisthesis at C5-6 of 2-3 mm. Degenerative anterolisthesis at C4-5 of 1-2 mm.  Vertebrae: No fracture. Degenerative endplate changes at C5-6 with mild edema which could relate to pain.  Cord: No cord compression or focal cord lesion.  Posterior Fossa, vertebral arteries, paraspinal tissues: Negative  Disc levels:  The foramen magnum is widely patent. There is ordinary mild osteoarthritis of the C1-2 articulation but no encroachment upon the neural structures.  C2-3: Facet degeneration on the left. Moderate left foraminal stenosis.  C3-4: Facet degeneration on the left. Moderate left foraminal stenosis.  C4-5: Bilateral facet degeneration worse on the left. 1-2 mm of anterolisthesis. Foraminal stenosis on the left. No central canal stenosis.  C5-6: Facet arthropathy worse on the left. Anterolisthesis of 2-3 mm. No compressive central canal stenosis. Foraminal stenosis on the left that could compress the C6 nerve.  C6-7: Bulging of the disc. Left-sided facet degeneration. No central canal stenosis. Mild left foraminal narrowing.  C7-T1: Negative interspace.  IMPRESSION: 1. Scoliotic curvature convex to the right. Degenerative anterolisthesis at C4-5 and C5-6. 2. Facet arthropathy on the left at C2-3, C3-4, C4-5 and C5-6 with foraminal stenosis on the left at those levels. Foraminal stenosis appears most severe at the C5-6 level on the left.  Electronically Signed   By: Oneil Officer M.D.   On: 02/09/2024 16:34 Note: Reviewed        Postop Exam  General appearance: Afebrile. Well nourished, well developed, and well hydrated. In no apparent acute distress. There were no vitals filed for this visit. BMI Assessment: Estimated body mass index is 29.68 kg/m as calculated from the following:   Height as of 02/11/24: 4' 10  (1.473 m).   Weight as of 02/11/24: 142 lb (64.4 kg). Surgical site: Wound is healing well. No redness, tenderness, discharge, abnormal odors, or any other evidence of infection or complications.  Assessment   Diagnosis Status  1. Chronic low back pain (1ry area of Pain) (Bilateral) (R>L) w/o sciatica   2. Chronic radicular pain of lower extremity   3. Chronic lower extremity pain (2ry area of Pain) (Bilateral) (R>L)   4. Presence of intrathecal pump   5. Postop check    Controlled Controlled Controlled   Updated Problems: No problems updated.  Plan of Care  Problem-specific:  Assessment and Plan            Jordan Chan has a current medication list which includes the following long-term medication(s): albuterol , albuterol , apixaban , atorvastatin , ferrous sulfate , furosemide , gabapentin , insulin  lispro, metformin , metoprolol  succinate, montelukast , pantoprazole , and venlafaxine  xr.  Pharmacotherapy (Medications Ordered): No orders of the defined types were placed in this encounter.  Orders:  No orders of the defined types were placed in this encounter.    Interventional Therapies  Risk Factors  Considerations  Medical Comorbidities:  Eliquis  Anticoagulation: (Stop: 3 days  Restart: 6 hours)  A-Fib  Hx of PE  CHF w/ Reduced EF  Cardiomyopathy  Stage 3a CKD  COPD on O2  GERD  T2IDDM  OSA     Planned  Pending:   Revision and replacement of intrathecal pump reservoir secondary to battery depletion. (Before July 2025)    Under consideration:   Revision and replacement of intrathecal pump reservoir secondary to battery depletion. (Before July 2025)  Intrathecal pump refill and management.   Completed:   Surgery: Intrathecal pump implant #1 (*) by Lonni Charleston, MD    Therapeutic  Palliative (PRN) options:   None established   Completed by other providers:   Surgery: Intrathecal pump replacement (05/27/2017) by [place] Lonni LABOR, MD  ([facility])     No follow-ups on file.    Recent Visits Date Type Provider Dept  02/01/24 Office Visit Tanya Glisson, MD [facility]  12/15/23 Procedure visit Tanya Glisson, MD [facility]  Showing recent visits within past 90 days and meeting all other requirements Future Appointments Date Type Provider Dept  02/22/24 Appointment Tanya Glisson, MD [facility]  Showing future appointments within next 90 days and meeting all other requirements  I discussed the assessment and treatment plan with the patient. The patient was provided an opportunity to ask questions and all were answered. The patient agreed with the plan and demonstrated an understanding of the instructions.  Patient advised to call back or seek an in-person evaluation if the symptoms or condition worsens.  Duration of encounter: *** minutes.  Total time on encounter, as per AMA guidelines included both the face-to-face and non-face-to-face time personally spent by the physician and/or other qualified health care professional(s) on the day of the encounter (includes time in activities that require the physician or other qualified health care professional and does not include time in activities normally performed by clinical staff). Physician's time may include the following activities when performed: preparing to see the patient (eg, review of tests, pre-charting review of records) obtaining and/or reviewing separately obtained history performing a medically appropriate examination and/or evaluation counseling and educating the patient/family/caregiver ordering medications, tests, or procedures referring and communicating with other health care professionals (when not separately reported) documenting clinical information in the electronic or other health record independently interpreting results (not separately reported) and communicating results to the patient/  family/caregiver care coordination (not separately reported)  Note by: Glisson LABOR Tanya, MD Date: 02/22/2024; Time: 8:05 PM

## 2024-02-22 ENCOUNTER — Ambulatory Visit: Attending: Pain Medicine | Admitting: Pain Medicine

## 2024-02-22 ENCOUNTER — Encounter: Payer: Self-pay | Admitting: Pain Medicine

## 2024-02-22 VITALS — BP 141/55 | HR 95 | Temp 97.9°F | Resp 18 | Ht <= 58 in | Wt 142.0 lb

## 2024-02-22 DIAGNOSIS — Z09 Encounter for follow-up examination after completed treatment for conditions other than malignant neoplasm: Secondary | ICD-10-CM | POA: Insufficient documentation

## 2024-02-22 DIAGNOSIS — M79604 Pain in right leg: Secondary | ICD-10-CM | POA: Diagnosis present

## 2024-02-22 DIAGNOSIS — Z978 Presence of other specified devices: Secondary | ICD-10-CM | POA: Insufficient documentation

## 2024-02-22 DIAGNOSIS — M79605 Pain in left leg: Secondary | ICD-10-CM | POA: Insufficient documentation

## 2024-02-22 DIAGNOSIS — G8929 Other chronic pain: Secondary | ICD-10-CM | POA: Diagnosis present

## 2024-02-22 DIAGNOSIS — M5410 Radiculopathy, site unspecified: Secondary | ICD-10-CM | POA: Insufficient documentation

## 2024-02-22 DIAGNOSIS — M545 Low back pain, unspecified: Secondary | ICD-10-CM | POA: Insufficient documentation

## 2024-02-22 NOTE — Patient Instructions (Signed)
 Leave the steri strips on until they fall off.  Keep the area clean and dry.

## 2024-02-22 NOTE — Addendum Note (Signed)
 Addended by: TANYA GLISSON A on: 02/22/2024 11:51 AM   Modules accepted: Orders

## 2024-03-01 ENCOUNTER — Inpatient Hospital Stay: Attending: Nurse Practitioner | Admitting: Nurse Practitioner

## 2024-03-01 ENCOUNTER — Inpatient Hospital Stay

## 2024-03-01 ENCOUNTER — Ambulatory Visit

## 2024-03-01 ENCOUNTER — Other Ambulatory Visit: Payer: Self-pay

## 2024-03-01 ENCOUNTER — Encounter: Payer: Self-pay | Admitting: Nurse Practitioner

## 2024-03-01 VITALS — BP 119/70 | HR 94 | Temp 98.1°F | Resp 19 | Ht 58.5 in | Wt 142.2 lb

## 2024-03-01 DIAGNOSIS — D472 Monoclonal gammopathy: Secondary | ICD-10-CM | POA: Diagnosis present

## 2024-03-01 LAB — COMPREHENSIVE METABOLIC PANEL WITH GFR
ALT: 13 U/L (ref 0–44)
AST: 19 U/L (ref 15–41)
Albumin: 3.8 g/dL (ref 3.5–5.0)
Alkaline Phosphatase: 78 U/L (ref 38–126)
Anion gap: 9 (ref 5–15)
BUN: 17 mg/dL (ref 8–23)
CO2: 30 mmol/L (ref 22–32)
Calcium: 9.2 mg/dL (ref 8.9–10.3)
Chloride: 97 mmol/L — ABNORMAL LOW (ref 98–111)
Creatinine, Ser: 1.09 mg/dL — ABNORMAL HIGH (ref 0.44–1.00)
GFR, Estimated: 54 mL/min — ABNORMAL LOW (ref >60)
Glucose, Bld: 146 mg/dL — ABNORMAL HIGH (ref 70–99)
Potassium: 4.3 mmol/L (ref 3.5–5.1)
Sodium: 136 mmol/L (ref 135–145)
Total Bilirubin: 0.4 mg/dL (ref 0.0–1.2)
Total Protein: 7.9 g/dL (ref 6.5–8.1)

## 2024-03-01 LAB — CBC WITH DIFFERENTIAL/PLATELET
Abs Immature Granulocytes: 0.04 K/uL (ref 0.00–0.07)
Basophils Absolute: 0.1 K/uL (ref 0.0–0.1)
Basophils Relative: 1 %
Eosinophils Absolute: 0.4 K/uL (ref 0.0–0.5)
Eosinophils Relative: 4 %
HCT: 37.7 % (ref 36.0–46.0)
Hemoglobin: 11.8 g/dL — ABNORMAL LOW (ref 12.0–15.0)
Immature Granulocytes: 0 %
Lymphocytes Relative: 25 %
Lymphs Abs: 2.5 K/uL (ref 0.7–4.0)
MCH: 27.8 pg (ref 26.0–34.0)
MCHC: 31.3 g/dL (ref 30.0–36.0)
MCV: 88.7 fL (ref 80.0–100.0)
Monocytes Absolute: 0.8 K/uL (ref 0.1–1.0)
Monocytes Relative: 8 %
Neutro Abs: 6 K/uL (ref 1.7–7.7)
Neutrophils Relative %: 62 %
Platelets: 439 K/uL — ABNORMAL HIGH (ref 150–400)
RBC: 4.25 MIL/uL (ref 3.87–5.11)
RDW: 14.3 % (ref 11.5–15.5)
WBC: 9.9 K/uL (ref 4.0–10.5)
nRBC: 0 % (ref 0.0–0.2)

## 2024-03-01 NOTE — Progress Notes (Signed)
 Does not know name of new medications; was prescribed at [facility].  Was told to hold off on starting new meds d/t having high potassium but has started taking since then.  Neck pain 3; back pain 4

## 2024-03-01 NOTE — Progress Notes (Signed)
 Hematology/Oncology Consult Note [facility]  [phone] [phone]  Patient Care Team: [name], Jolene T, NP as PCP - General (Nurse Practitioner) End, Lonni, MD as PCP - Cardiology (Cardiology) Theotis Lavelle BRAVO, MD as Referring Physician (Pulmonary Disease) Nyle Rankin POUR, RPH (Inactive) (Pharmacist) Karoline Lima, RN as [facility] Management Jaye Fallow, MD as Referring Physician (Ophthalmology) Tanya Glisson, MD as Referring Physician (Pain Medicine) Unk Corinn Skiff, MD as Consulting Physician (Gastroenterology) Donette Ellouise LABOR, FNP (Cardiology)   Name of the patient: Jordan Chan  [id number]  05-04-1951   Date of visit: 03/01/24  Diagnosis- neutrophilia likely reactive  Chief complaint/Reason for visit- routine follow-up of leukocytosis/neutrophilia  Heme/Onc history: patient is a 73 year old female with a past medical history significant for COPD GERD insulin -dependent diabetes history of breast cancer in the remote past who has been referred to us  for leukocytosis and thrombocytosis.Looking back at her CBC her white cell count fluctuates between 10-16 over the last 7 years.  More recently since the last 3 months her white count has been between 11-13.  There are times when her white count has gone up to 34 and patient thinks that she may have been on steroids.  Differential has mainly showed neutrophilia but also with some monocytosis and mild eosinophilia.  Patient also noted to have a platelet count mainly in the high 400s at least since 2015 with no clear rising trend.  Hemoglobin more recently has been close to 11   Results of blood work from 12/21/2020 were as follows: CBC showed white cell count of 13, H&H of 11.7/37.2 with an MCV of 89 and platelet count of 446.  Iron  studies did show evidence of iron  deficiency with elevated TIBC of 475, low iron  saturation of 8%And low ferritin of 5 patient received 5  doses of Venofer    BCR ABL FISH testing, JAK2 mutation analysis, flow cytometry wasUnremarkable.    Interval history- PLUMA DINIZ is a 73 year old female who returns to cl inic for follow up.    ECOG PS- 1 Pain scale- 4  Review of systems- Review of Systems  Constitutional:  Negative for chills, fever, malaise/fatigue and weight loss.  HENT:  Negative for congestion, ear discharge and nosebleeds.   Eyes:  Negative for blurred vision.  Respiratory:  Negative for cough, hemoptysis, sputum production, shortness of breath and wheezing.   Cardiovascular:  Negative for chest pain, palpitations, orthopnea and claudication.  Gastrointestinal:  Negative for abdominal pain, blood in stool, constipation, diarrhea, heartburn, melena, nausea and vomiting.  Genitourinary:  Negative for dysuria, flank pain, frequency, hematuria and urgency.  Musculoskeletal:  Positive for joint pain. Negative for back pain and myalgias.  Skin:  Negative for rash.  Neurological:  Negative for dizziness, tingling, focal weakness, seizures, weakness and headaches.  Endo/Heme/Allergies:  Does not bruise/bleed easily.  Psychiatric/Behavioral:  Negative for depression and suicidal ideas. The patient does not have insomnia.     Allergies  Allergen Reactions   Other Palpitations    IV steroids   Pain Patch [Menthol] Anaphylaxis   Jardiance  [Empagliflozin ] Other (See Comments)    Yeast infection & dry mouth   Prednisone     Avelox  [Moxifloxacin  Hcl In Nacl] Other (See Comments)    Upset stomach   Doxycycline  Diarrhea   Erythromycin  Nausea Only and Other (See Comments)    Has used azithromycin  without documented ADRs   Fentanyl  Nausea Only and Rash   Moxifloxacin  Hcl Other (See Comments)    Upset  stomach   Oxycontin [Oxycodone] Hives   Ozempic [Semaglutide] Nausea Only   Past Medical History:  Diagnosis Date   (HFpEF) heart failure with preserved ejection fraction (HCC)    Anemia    Anxiety    Aortic  atherosclerosis (HCC)    Arthritis    Asthma    Bilateral pulmonary embolism (small subsegmental) 08/11/2023   Breast cancer, right (HCC) 1998   a.) Tx'd with surgical excision + chemotherapy + XRT   Bronchitis    Cerebral microvascular disease    Chronic pain syndrome    CKD (chronic kidney disease), stage III (HCC)    COPD (chronic obstructive pulmonary disease) (HCC)    DDD (degenerative disc disease), cervical    Decreased ROM of intervertebral discs of cervical spine    Dependence on nocturnal oxygen  therapy (2L/[place])    Depression    Diabetes mellitus type 2, insulin  dependent (HCC)    Diverticulitis    Dyspnea    Endometriosis    GERD (gastroesophageal reflux disease)    History of shingles    Hypercholesteremia    Hypertension    IBS (irritable bowel syndrome)    Low back pain    a.) has intrathecal pump (morphine /bupivicaine/clonidine )   Neuropathy    On apixaban  therapy    Opioid withdrawal (HCC)    a.) experiences withdrawal symptoms when batteries in intrathecal pump reach EOL   Orthopnea    PAF (paroxysmal atrial fibrillation) (HCC)    a.) CHA2DS2VASc = 8 (age, sex, HFpEF, HTN, TIA/PE x 2, vascular disease, T2DM) as of metoprolol  succinate; b.) rate/rhythm maintained on oral metoprolol  succinate; chronically anticoagulated with apixaban    Personal history of chemotherapy    Personal history of radiation therapy    Pneumonia    Scoliosis    Sleep apnea    a.) no nocturnal PAP therapy; requires supplemental oxygen  at bedtime   Status post bilateral cataract extraction 2020   Takotsubo cardiomyopathy    TIA (transient ischemic attack) 2010   Past Surgical History:  Procedure Laterality Date   ABDOMINAL HYSTERECTOMY  1987   BACK SURGERY     Tailbone removed following fracture   BREAST SURGERY Right    mastectomy   CARDIAC CATHETERIZATION     CATARACT EXTRACTION W/PHACO Right 05/19/2019   Procedure: CATARACT EXTRACTION PHACO AND INTRAOCULAR LENS PLACEMENT  (IOC), RIGHT, DIABETIC;  Surgeon: Ferol Rogue, MD;  Location: [facility] ORS;  Service: Ophthalmology;  Laterality: Right;  Lot # [id number] H US : 00:43.8 CDE: 4.59   CATARACT EXTRACTION W/PHACO Left 06/16/2019   Procedure: CATARACT EXTRACTION PHACO AND INTRAOCULAR LENS PLACEMENT (IOC) LEFT VISION BLUE DIABETIC;  Surgeon: Ferol Rogue, MD;  Location: [facility] ORS;  Service: Ophthalmology;  Laterality: Left;  Lot #[id number] H US : 00:46.9 CDE: 6.53   COCCYX REMOVAL     COLONOSCOPY WITH PROPOFOL  N/A 01/01/2017   Procedure: COLONOSCOPY WITH PROPOFOL ;  Surgeon: Therisa Bi, MD;  Location[facility] ENDOSCOPY;  Service: Endoscopy;  Laterality: N/A;   COLONOSCOPY WITH PROPOFOL  N/A 12/11/2021   Procedure: COLONOSCOPY WITH PROPOFOL ;  Surgeon: Unk Corinn Skiff, MD;  Location: [facility] ENDOSCOPY;  Service: Gastroenterology;  Laterality: N/A;   ELBOW ARTHROSCOPY WITH TENDON RECONSTRUCTION     ESOPHAGOGASTRODUODENOSCOPY (EGD) WITH PROPOFOL  N/A 01/01/2017   Procedure: ESOPHAGOGASTRODUODENOSCOPY (EGD) WITH PROPOFOL ;  Surgeon: Therisa Bi, MD;  Location: [facility] ENDOSCOPY;  Service: Endoscopy;  Laterality: N/A;   ESOPHAGOGASTRODUODENOSCOPY (EGD) WITH PROPOFOL  N/A 12/11/2021   Procedure: ESOPHAGOGASTRODUODENOSCOPY (EGD) WITH PROPOFOL ;  Surgeon: Unk Corinn Skiff, MD;  Location: [facility] ENDOSCOPY;  Service: Gastroenterology;  Laterality: N/A;   INTRATHECAL PUMP IMPLANT     LEFT HEART CATH AND CORONARY ANGIOGRAPHY N/A 04/28/2017   Procedure: LEFT HEART CATH AND CORONARY ANGIOGRAPHY;  Surgeon: Mady Bruckner, MD;  Location: [facility] INVASIVE CV LAB;  Service: Cardiovascular;  Laterality: N/A;   MASTECTOMY Right 06/1997   morphine  pump  2011   PAIN PUMP REVISION N/A 02/11/2024   Procedure: PAIN PUMP REPLACEMENT;  Surgeon: Tanya Glisson, MD;  Location: [facility] ORS;  Service: Neurosurgery;  Laterality: N/A;   PLANTAR FASCIA RELEASE     TOTAL HIP ARTHROPLASTY Left    TRIGGER FINGER RELEASE     Social History   Socioeconomic History    Marital status: Married    Spouse name: Darina   Number of children: 2   Years of education: Not on file   Highest education level: High school graduate  Occupational History   Occupation: retired  Tobacco Use   Smoking status: Former    Current packs/day: 0.00    Average packs/day: 1 pack/day for 30.0 years (30.0 ttl pk-yrs)    Types: Cigarettes    Start date: 04/30/1974    Quit date: 04/30/2004    Years since quitting: 19.8    Passive exposure: Past   Smokeless tobacco: Never  Vaping Use   Vaping status: Never Used  Substance and Sexual Activity   Alcohol  use: No   Drug use: No   Sexual activity: Not Currently    Birth control/protection: Post-menopausal  Other Topics Concern   Not on file  Social History Narrative   ** Merged History Encounter ** ** Merged History Encounter **       Lives in home with mentally handicap son   Social Drivers of Corporate investment banker Strain: Low Risk  (01/29/2024)   Received from [facility]   Overall Financial Resource Strain (CARDIA)    Difficulty of Paying Living Expenses: Not hard at all  Food Insecurity: No Food Insecurity (01/29/2024)   Received from [facility]   Hunger Vital Sign    Within the past 12 months, you worried that your food would run out before you got the money to buy more.: Never true    Within the past 12 months, the food you bought just didn't last and you didn't have money to get more.: Never true  Transportation Needs: No Transportation Needs (01/29/2024)   Received from [facility] - Transportation    In the past 12 months, has lack of transportation kept you from medical appointments or from getting medications?: No    Lack of Transportation (Non-Medical): No  Physical Activity: Inactive (12/29/2023)   Exercise Vital Sign    Days of Exercise per Week: 0 days    Minutes of Exercise per Session: 0 min  Stress: No Stress Concern Present  (12/29/2023)   [employer] of Occupational Health - Occupational Stress Questionnaire    Feeling of Stress : Only a little  Social Connections: Moderately Isolated (12/29/2023)   Social Connection and Isolation Panel    Frequency of Communication with Friends and Family: More than three times a week    Frequency of Social Gatherings with Friends and Family: More than three times a week    Attends Religious Services: More than 4 times per year    Active Member of [employer] or Organizations: No    Attends Banker Meetings: Never    Marital Status: Separated  Intimate  Partner Violence: Not At Risk (12/29/2023)   Humiliation, Afraid, Rape, and Kick questionnaire    Fear of Current or Ex-Partner: No    Emotionally Abused: No    Physically Abused: No    Sexually Abused: No    Family History  Problem Relation Age of Onset   Cancer Mother 33       lung   Thyroid  disease Mother    Cancer Father 82       Pancreatic   Hypertension Sister    Cancer Sister        cancer on face   Hyperlipidemia Sister    Osteoporosis Maternal Grandmother    Cancer Paternal Grandmother        colon   Arthritis Paternal Grandfather    Hyperlipidemia Son    Seizures Son    Breast cancer Neg Hx     Current Outpatient Medications:    ACCU-CHEK AVIVA PLUS test strip, TEST THREE TIMES DAILY, Disp: 300 strip, Rfl: 3   Accu-Chek Softclix Lancets lancets, TEST BLOOD SUGAR THREE TIMES DAILY, Disp: 300 each, Rfl: 3   albuterol  (PROVENTIL ) (2.5 MG/3ML) 0.083% nebulizer solution, Take 3 mLs (2.5 mg total) by nebulization every 4 (four) hours as needed for wheezing or shortness of breath., Disp: 75 mL, Rfl: 1   albuterol  (VENTOLIN  HFA) 108 (90 Base) MCG/ACT inhaler, Inhale 2 puffs into the lungs every 6 (six) hours as needed for wheezing or shortness of breath., Disp: 1 each, Rfl: 1   apixaban  (ELIQUIS ) 5 MG TABS tablet, Take 1 tablet (5 mg total) by mouth 2 (two) times daily., Disp: 180 tablet, Rfl:  3   atorvastatin  (LIPITOR) 40 MG tablet, Take 1 tablet (40 mg total) by mouth daily., Disp: 90 tablet, Rfl: 3   Blood Glucose Monitoring Suppl (TRUE METRIX METER) w/Device KIT, Use to check blood sugar 4 times a day, Disp: 1 kit, Rfl: 4   busPIRone  (BUSPAR ) 5 MG tablet, Take 1 tablet (5 mg total) by mouth 2 (two) times daily., Disp: 30 tablet, Rfl: 1   cholecalciferol  (VITAMIN D3) 25 MCG (1000 UNIT) tablet, Take 1,000 Units by mouth daily., Disp: , Rfl:    Continuous Glucose Receiver (FREESTYLE LIBRE 2 READER) DEVI, To check blood sugars 3-4 times daily due to insulin  use. DX E11.40, Disp: 2 each, Rfl: 5   Continuous Glucose Sensor (FREESTYLE LIBRE 2 SENSOR) MISC, USE 1 SENSOR TO CHECK BLOOD SUGARS 3-4 TIMES DAILY DUE TO INSULIN  USE, Disp: 2 each, Rfl: 5   Cyanocobalamin  1000 MCG/ML KIT, Inject 1,000 mcg as directed every 30 (thirty) days. Next injection 3/21, Disp: , Rfl:    cyclobenzaprine  (FLEXERIL ) 5 MG tablet, Take 5 mg by mouth daily as needed for muscle spasms., Disp: , Rfl:    dapagliflozin  propanediol (FARXIGA ) 10 MG TABS tablet, Take 1 tablet (10 mg total) by mouth daily before breakfast., Disp: 90 tablet, Rfl: 3   docusate sodium  (COLACE) 100 MG capsule, Take 1 capsule (100 mg total) by mouth 2 (two) times daily as needed for mild constipation., Disp: 30 capsule, Rfl: 0   Dulaglutide  (TRULICITY ) 4.5 MG/0.5ML SOAJ, Inject 4.5 mg into the skin once a week., Disp: 12 mL, Rfl: 3   ENTRESTO  24-26 MG, Take 1 tablet by mouth 2 (two) times daily., Disp: , Rfl:    folic acid  (FOLVITE ) 1 MG tablet, TAKE 1 TABLET EVERY DAY, Disp: 90 tablet, Rfl: 0   furosemide  (LASIX ) 40 MG tablet, Take 0.5 tablets (20 mg total) by mouth daily.,  Disp: 30 tablet, Rfl: 1   gabapentin  (NEURONTIN ) 400 MG capsule, Take 1 capsule (400 mg total) by mouth 3 (three) times daily., Disp: 90 capsule, Rfl: 12   insulin  detemir (LEVEMIR ) 100 UNIT/ML injection, 30 Units., Disp: , Rfl:    insulin  lispro (HUMALOG  KWIKPEN) 100  UNIT/ML KwikPen, Inject 5 Units into the skin 3 (three) times daily before meals. Do not inject insulin  if you are not going to eat meal.  Only take before a meal., Disp: 15 mL, Rfl: 3   metFORMIN  (GLUCOPHAGE ) 1000 MG tablet, Take 1 tablet (1,000 mg total) by mouth 2 (two) times daily with a meal., Disp: 180 tablet, Rfl: 3   metoprolol  succinate (TOPROL -XL) 50 MG 24 hr tablet, Take 1 tablet (50 mg total) by mouth daily. Take with or immediately following a meal., Disp: 90 tablet, Rfl: 3   montelukast  (SINGULAIR ) 10 MG tablet, Take 1 tablet (10 mg total) by mouth at bedtime., Disp: 90 tablet, Rfl: 3   naloxone  (NARCAN ) nasal spray 4 mg/0.1 mL, Place 1 spray into the nose as needed for up to 365 doses (for opioid-induced respiratory depresssion). In case of emergency (overdose), spray once into each nostril. If no response within 3 minutes, repeat application and call 911., Disp: 1 each, Rfl: 1   Nebulizers (EASY AIR COMPRESSOR NEBULIZER) MISC, Use to inhaler nebulizer treatments at needed per instructions on nebulizer prescription, Disp: 1 each, Rfl: 0   ondansetron  (ZOFRAN ) 4 MG tablet, Take 1 tablet (4 mg total) by mouth daily as needed for nausea or vomiting., Disp: 30 tablet, Rfl: 1   OXYGEN , Inhale 2 L into the lungs at bedtime., Disp: , Rfl:    PAIN MANAGEMENT INTRATHECAL, IT, PUMP, 1 each by Intrathecal route. Intrathecal (IT) medication:  Morphine  5.0 mg/ml, Bupivacaine  20.0 mg/ml,  Clonidine  100.0 mcg/ml Patient does not remember current. Adjusted every 3 months at [facility], Disp: , Rfl:    pantoprazole  (PROTONIX ) 40 MG tablet, Take 1 tablet (40 mg total) by mouth daily., Disp: 90 tablet, Rfl: 3   TRELEGY ELLIPTA  100-62.5-25 MCG/ACT AEPB, INHALE 1 PUFF INTO THE LUNGS DAILY . 42 DAY EXPIRATION AFTER OPENING FOIL TRAY, Disp: 180 each, Rfl: 3   TRESIBA  FLEXTOUCH 100 UNIT/ML FlexTouch Pen, Inject 55 Units into the skin at bedtime., Disp: 30 mL, Rfl: 5   venlafaxine  XR (EFFEXOR -XR) 150 MG  24 hr capsule, Take 1 capsule (150 mg total) by mouth daily., Disp: 90 capsule, Rfl: 3   alum & mag hydroxide-simeth (MAALOX/MYLANTA) 200-200-20 MG/5ML suspension, Take 30 mLs by mouth every 6 (six) hours as needed for indigestion or heartburn. (Patient not taking: Reported on 03/01/2024), Disp: 300 mL, Rfl: 1   ferrous sulfate  325 (65 FE) MG EC tablet, Take 1 tablet (325 mg total) by mouth 2 (two) times daily. (Patient not taking: Reported on 03/01/2024), Disp: 60 tablet, Rfl: 1   polyethylene glycol (MIRALAX  / GLYCOLAX ) 17 g packet, Take 17 g by mouth daily as needed for moderate constipation. (Patient not taking: Reported on 03/01/2024), Disp: 10 packet, Rfl: 0  Current Facility-Administered Medications:    cyanocobalamin  (VITAMIN B12) injection 1,000 mcg, 1,000 mcg, Intramuscular, Q30 days, [name], Jolene T, NP, 1,000 mcg at 02/17/24 1048  Physical exam:  Vitals:   03/01/24 0945  BP: 119/70  Pulse: 94  Resp: 19  Temp: 98.1 F (36.7 C)  TempSrc: Tympanic  SpO2: 99%  Weight: 142 lb 3.2 oz (64.5 kg)  Height: 4' 10.5 (1.486 m)  PF: (!) 2  L/min   Physical Exam Vitals reviewed.  Cardiovascular:     Rate and Rhythm: Normal rate and regular rhythm.  Pulmonary:     Effort: Pulmonary effort is normal.     Breath sounds: Normal breath sounds.  Abdominal:     General: There is no distension.  Skin:    General: Skin is warm and dry.  Neurological:     Mental Status: She is alert and oriented to person, place, and time.  Psychiatric:        Mood and Affect: Mood normal.        Behavior: Behavior normal.        Latest Ref Rng & Units 03/01/2024   10:36 AM 01/28/2024    6:48 PM 12/18/2023    9:56 AM  CBC EXTENDED  WBC 4.0 - 10.5 K/uL 9.9  13.2  9.3   RBC 3.87 - 5.11 MIL/uL 4.25  3.73  4.24   Hemoglobin 12.0 - 15.0 g/dL 88.1  89.0  87.9   HCT 36.0 - 46.0 % 37.7  34.9  38.9   Platelets 150 - 400 K/uL 439  374  429   NEUT# 1.7 - 7.7 K/uL 6.0   5.8   Lymph# 0.7 - 4.0 K/uL 2.5   2.4        Latest Ref Rng & Units 03/01/2024   10:36 AM  CMP  Glucose 70 - 99 mg/dL 853   BUN 8 - 23 mg/dL 17   Creatinine 9.55 - 1.00 mg/dL 8.90   Sodium 864 - 854 mmol/L 136   Potassium 3.5 - 5.1 mmol/L 4.3   Chloride 98 - 111 mmol/L 97   CO2 22 - 32 mmol/L 30   Calcium  8.9 - 10.3 mg/dL 9.2   Total Protein 6.5 - 8.1 g/dL 7.9   Total Bilirubin 0.0 - 1.2 mg/dL 0.4   Alkaline Phos 38 - 126 U/L 78   AST 15 - 41 U/L 19   ALT 0 - 44 U/L 13     Assessment and Plan- Patient is a 73 y.o. female who returns to clinic for follow up of:   Leukocytosis- neutrophilia. Waxing and waning leukocytosis with white count that fluctuates between 8-16 without a clear rising trend. Hmg has remained stable between 11-12 and platelets have been normal. Flow cytometry, BCR-ABL, and JAK2 were all negative. Today, her wbc 9.9. ANC is 6.0. Discussed that leukocytosis is not correlated with MGUS. WBC has now normalized so can monitor for now.  MGUS- she presents with abnormal serum free light chains. Kappa 61.9, lambda 33.8, kappa lambda ratio 1.83 (slightly elevated), M protein 0.6. CBC shows mild anemia, normal wbc, elevated platelets. Creatnine 1.09 consistent with CKD. Calcium  9.2, normal. No evidence of crab criteria. Reviewed pathophysiology of MGUS. Would hold off on a bone marrow biopsy for now. May consider if symptoms or lab changes occur. Imaging if bone pain or fractures present.   Disposition:  Add on lab today 6 mo- lab (cbc, cmp, spep, k/l lc, immunoglobulins) 2 weeks later see Dr Melanee for MGUS follow up- la   Visit Diagnosis 1. MGUS (monoclonal gammopathy of unknown significance)    Tinnie Dawn, DNP, AGNP-C, AOCNP [facility] [phone] (clinic)

## 2024-03-02 LAB — PROTEIN ELECTROPHORESIS, SERUM
A/G Ratio: 0.9 (ref 0.7–1.7)
Albumin ELP: 3.5 g/dL (ref 2.9–4.4)
Alpha-1-Globulin: 0.2 g/dL (ref 0.0–0.4)
Alpha-2-Globulin: 0.8 g/dL (ref 0.4–1.0)
Beta Globulin: 1.1 g/dL (ref 0.7–1.3)
Gamma Globulin: 1.6 g/dL (ref 0.4–1.8)
Globulin, Total: 3.7 g/dL (ref 2.2–3.9)
M-Spike, %: 0.6 g/dL — ABNORMAL HIGH
Total Protein ELP: 7.2 g/dL (ref 6.0–8.5)

## 2024-03-02 LAB — KAPPA/LAMBDA LIGHT CHAINS
Kappa free light chain: 61.9 mg/L — ABNORMAL HIGH (ref 3.3–19.4)
Kappa, lambda light chain ratio: 1.83 — ABNORMAL HIGH (ref 0.26–1.65)
Lambda free light chains: 33.8 mg/L — ABNORMAL HIGH (ref 5.7–26.3)

## 2024-03-08 DIAGNOSIS — M542 Cervicalgia: Secondary | ICD-10-CM | POA: Diagnosis not present

## 2024-03-10 DIAGNOSIS — M542 Cervicalgia: Secondary | ICD-10-CM | POA: Diagnosis not present

## 2024-03-14 ENCOUNTER — Ambulatory Visit

## 2024-03-14 ENCOUNTER — Telehealth: Payer: Self-pay | Admitting: Nurse Practitioner

## 2024-03-14 NOTE — Telephone Encounter (Signed)
 Patient dropped of DMV papework that needs to be completed by the provider in order to keep/renew license. Paperwork has been placed in the provider folder for completion.Call patient where ready to pick up. Next appt is  8/18

## 2024-03-15 ENCOUNTER — Other Ambulatory Visit: Payer: Self-pay

## 2024-03-15 DIAGNOSIS — M542 Cervicalgia: Secondary | ICD-10-CM | POA: Diagnosis not present

## 2024-03-15 MED ORDER — PAIN MANAGEMENT IT PUMP REFILL: 1.0000 | Freq: Once | INTRATHECAL | 0 refills | Status: AC

## 2024-03-15 NOTE — Telephone Encounter (Signed)
 Paperwork received. Will start and then give to provider to complete and sign.

## 2024-03-17 DIAGNOSIS — E119 Type 2 diabetes mellitus without complications: Secondary | ICD-10-CM | POA: Diagnosis not present

## 2024-03-17 DIAGNOSIS — M542 Cervicalgia: Secondary | ICD-10-CM | POA: Diagnosis not present

## 2024-03-18 ENCOUNTER — Telehealth: Payer: Self-pay

## 2024-03-19 NOTE — Patient Instructions (Signed)

## 2024-03-21 ENCOUNTER — Encounter: Payer: Self-pay | Admitting: Nurse Practitioner

## 2024-03-21 ENCOUNTER — Ambulatory Visit: Admitting: Nurse Practitioner

## 2024-03-21 VITALS — BP 120/66 | HR 89 | Temp 98.6°F | Ht 58.4 in | Wt 142.2 lb

## 2024-03-21 DIAGNOSIS — Z794 Long term (current) use of insulin: Secondary | ICD-10-CM

## 2024-03-21 DIAGNOSIS — G894 Chronic pain syndrome: Secondary | ICD-10-CM

## 2024-03-21 DIAGNOSIS — E1159 Type 2 diabetes mellitus with other circulatory complications: Secondary | ICD-10-CM

## 2024-03-21 DIAGNOSIS — E1142 Type 2 diabetes mellitus with diabetic polyneuropathy: Secondary | ICD-10-CM | POA: Diagnosis not present

## 2024-03-21 DIAGNOSIS — J449 Chronic obstructive pulmonary disease, unspecified: Secondary | ICD-10-CM | POA: Diagnosis not present

## 2024-03-21 DIAGNOSIS — E538 Deficiency of other specified B group vitamins: Secondary | ICD-10-CM

## 2024-03-21 DIAGNOSIS — E1169 Type 2 diabetes mellitus with other specified complication: Secondary | ICD-10-CM

## 2024-03-21 DIAGNOSIS — I5022 Chronic systolic (congestive) heart failure: Secondary | ICD-10-CM

## 2024-03-21 DIAGNOSIS — E119 Type 2 diabetes mellitus without complications: Secondary | ICD-10-CM

## 2024-03-21 DIAGNOSIS — F324 Major depressive disorder, single episode, in partial remission: Secondary | ICD-10-CM

## 2024-03-21 DIAGNOSIS — E669 Obesity, unspecified: Secondary | ICD-10-CM

## 2024-03-21 DIAGNOSIS — I152 Hypertension secondary to endocrine disorders: Secondary | ICD-10-CM | POA: Diagnosis not present

## 2024-03-21 DIAGNOSIS — I4819 Other persistent atrial fibrillation: Secondary | ICD-10-CM | POA: Diagnosis not present

## 2024-03-21 DIAGNOSIS — F1120 Opioid dependence, uncomplicated: Secondary | ICD-10-CM

## 2024-03-21 DIAGNOSIS — N1831 Chronic kidney disease, stage 3a: Secondary | ICD-10-CM

## 2024-03-21 DIAGNOSIS — E785 Hyperlipidemia, unspecified: Secondary | ICD-10-CM | POA: Diagnosis not present

## 2024-03-21 LAB — BAYER DCA HB A1C WAIVED: HB A1C (BAYER DCA - WAIVED): 7.4 % — ABNORMAL HIGH (ref 4.8–5.6)

## 2024-03-21 NOTE — Assessment & Plan Note (Signed)
 Chronic, ongoing with most recent 08/11/23 was 25-30%, a decline from previous EF 45-50% (10/22/22), followed by cardiology and HF Clinic.  Euvolemic today.  Continue current medication regimen and collaboration with cardiology.  Taking Entresto , will verify with HF Clinic that she should be taking this.  Recommend: - Reminded to call for an overnight weight gain of >2 pounds or a weekly weight gain of >5 pounds - not adding salt to food and read food labels. Reviewed the importance of keeping daily sodium intake to 2000mg  daily - Avoid Ibuprofen products

## 2024-03-21 NOTE — Assessment & Plan Note (Signed)
 BMI 29.31.  Recommended eating smaller high protein, low fat meals more frequently and exercising 30 mins a day 5 times a week with a goal of 10-15lb weight loss in the next 3 months. Patient voiced their understanding and motivation to adhere to these recommendations.

## 2024-03-21 NOTE — Assessment & Plan Note (Signed)
 Chronic, stable.  Rate well controlled.  Continue current medications and collaboration with cardiology.

## 2024-03-21 NOTE — Assessment & Plan Note (Signed)
 Chronic, ongoing with last A1c 7.4% today, slight trend down.  Urine ALB 80 February 2025. Continue current regimen and adjust as needed.  Did not tolerate Jardiance  in past + Mounjaro  made her sick.  Is tolerating Farxiga .  She monitors sugar levels frequently throughout daytime hours. - Vaccines up to date - Foot exam up to date.  Eye exam up to date. - ARB and statin on board.

## 2024-03-21 NOTE — Progress Notes (Signed)
 BP 120/66   Pulse 89   Temp 98.6 F (37 C) (Oral)   Ht 4' 10.4 (1.483 m)   Wt 142 lb 3.2 oz (64.5 kg)   LMP  (LMP Unknown)   SpO2 96%   BMI 29.31 kg/m    Subjective:    [name], female    DOB: September 27, 1950, 73 y.o.   MRN: [id number]  HPI: Jordan Chan is a 73 y.o. female  Chief Complaint  Patient presents with   Congestive Heart Failure   Depression   Diabetes   COPD   Gastroesophageal Reflux   Hyperlipidemia   Hypertension   DOT paperwork completed for patient.  DIABETES May A1c was 7.5%. Continues Metformin  1000 MG BID, Trulicity  4.5 MG weekly, Tresiba  55 units at HS, Farxiga , and meal time insulin  6 units.  Left her Freestyle reader at home by mistake.  History: Jardiance  made her sick in past and Mounjaro  gave her severe diarrhea and abdominal pain. Works with PharmD as needed for assist.   Hypoglycemic episodes: no Polydipsia/polyuria: no Visual disturbance: no Chest pain: no Paresthesias: no Glucose Monitoring: yes  Accucheck frequency: BID  Fasting glucose: 115 to 140  Post prandial: 200 to 300 range (2 hours after eating)  Evening:  Before meals: Taking Insulin ?: yes  Long acting insulin : 55 units  Short acting insulin : 6 units Blood Pressure Monitoring: monthly Retinal Examination: Up To Date -- [facility] Foot Exam: Up to Date Pneumovax: Up to Date Influenza: Up to Date Aspirin : no   HYPERTENSION / HYPERLIPIDEMIA/HF Continues to take Atorvastatin , Metoprolol , Lasix , Entresto  (was to hold this until next HF visit on review, but she is taking), Eliquis , + ASA. Last visit with cardiology was on 06/25/23 and saw HF Clinic on 01/14/24.  No changes.   Echo 08/11/23 showed EF 25-30%, left ventricle with severe decreased function. Satisfied with current treatment? yes Duration of hypertension: chronic BP monitoring frequency: a few times a week BP range:120/70 range BP medication side effects: no:  Duration of  hyperlipidemia: chronic Cholesterol medication side effects: no Cholesterol supplements: none Medication compliance: good compliance Aspirin : no Recent stressors: no Recurrent headaches: no Visual changes: no Palpitations: no Dyspnea: baseline, but is improving Chest pain: no Lower extremity edema: no Dizzy/lightheaded: no   ATRIAL FIBRILLATION Atrial fibrillation status: stable Satisfied with current treatment: yes  Medication side effects:  no Medication compliance: good compliance Etiology of atrial fibrillation: unknown Palpitations:  no Chest pain:  no Dyspnea on exertion: improving Orthopnea:  no Syncope:  no Edema:  no Ventricular rate control: B-blocker Anti-coagulation: long acting   COPD Continues to use Trelegy, Albuterol , and Singulair .  Last visit with pulmonary as on 12/07/23.   Has a history of smoking, quit 15 years ago. She does not use CPAP, but continues to use O2 at night 2 L.  Sleeps in recliner at baseline, has since 1998.  COPD status: stable Satisfied with current treatment?: yes Oxygen  use: yes 2L -- does not use while at home until night time Dyspnea frequency: baseline, is improving Cough frequency: no  Rescue inhaler frequency: rarely Limitation of activity: no Productive cough: no Last Spirometry: with pulmonary Pneumovax: Up to Date Influenza: Up to Date   CHRONIC KIDNEY DISEASE CKD status: stable Medications renally dose: yes Previous renal evaluation: no Pneumovax:  Up to Date Influenza Vaccine:  Up to Date   DEPRESSION Continues Effexor  150 MG daily + Buspar  5 MG BID. Saw pain clinic locally on 02/22/24,  with new pain pump placed on 02/11/24. Mood status: stable Satisfied with current treatment?: yes Symptom severity: moderate  Duration of current treatment : chronic Side effects: no Medication compliance: good compliance Psychotherapy/counseling: yes in the past Depressed mood: occasional Anxious mood: gets irritable at  times Anhedonia: no Significant weight loss or gain: no Insomnia: yes poor sleep ongoing since her dad passed away many years ago Fatigue: no Feelings of worthlessness or guilt: no Impaired concentration/indecisiveness: no Suicidal ideations: no Hopelessness: no Crying spells: occasional    03/21/2024    9:27 AM 03/01/2024    9:50 AM 02/22/2024   11:20 AM 02/19/2024    2:52 PM 02/01/2024    9:56 AM  Depression screen PHQ 2/9  Decreased Interest 0 0 0 0 0  Down, Depressed, Hopeless 1 0 0 0 1  PHQ - 2 Score 1 0 0 0 1  Altered sleeping 3      Tired, decreased energy 2      Change in appetite 2      Feeling bad or failure about yourself  1      Trouble concentrating 0      Moving slowly or fidgety/restless 0      Suicidal thoughts 0      PHQ-9 Score 9      Difficult doing work/chores Not difficult at all           03/21/2024    9:27 AM 12/18/2023   10:02 AM 11/17/2023    4:22 PM 11/17/2023    3:10 PM  GAD 7 : Generalized Anxiety Score  Nervous, Anxious, on Edge 0 1 1 1   Control/stop worrying 0 0 0 0  Worry too much - different things 0 0 0 0  Trouble relaxing 0 0 1 1  Restless 0 0 0 0  Easily annoyed or irritable 1 0 0 0  Afraid - awful might happen 0 0 0 0  Total GAD 7 Score 1 1 2 2   Anxiety Difficulty Not difficult at all Not difficult at all Not difficult at all Not difficult at all   Relevant past medical, surgical, family and social history reviewed and updated as indicated. Interim medical history since our last visit reviewed. Allergies and medications reviewed and updated.  Review of Systems  Constitutional:  Negative for activity change, appetite change, diaphoresis, fatigue and fever.  Respiratory:  Negative for cough, chest tightness and shortness of breath.   Cardiovascular:  Negative for chest pain, palpitations and leg swelling.  Gastrointestinal: Negative.   Endocrine: Negative for heat intolerance, polydipsia, polyphagia and polyuria.  Neurological:  Negative.   Psychiatric/Behavioral: Negative.     Per HPI unless specifically indicated above     Objective:    BP 120/66   Pulse 89   Temp 98.6 F (37 C) (Oral)   Ht 4' 10.4 (1.483 m)   Wt 142 lb 3.2 oz (64.5 kg)   LMP  (LMP Unknown)   SpO2 96%   BMI 29.31 kg/m   Wt Readings from Last 3 Encounters:  03/21/24 142 lb 3.2 oz (64.5 kg)  03/01/24 142 lb 3.2 oz (64.5 kg)  02/22/24 142 lb (64.4 kg)    Physical Exam Vitals and nursing note reviewed.  Constitutional:      General: She is awake. She is not in acute distress.    Appearance: Normal appearance. She is well-developed and well-groomed. She is not ill-appearing or toxic-appearing.  HENT:     Head: Normocephalic.  Right Ear: Hearing and external ear normal.     Left Ear: Hearing and external ear normal.  Eyes:     General: Lids are normal.        Right eye: No discharge.        Left eye: No discharge.     Conjunctiva/sclera: Conjunctivae normal.     Pupils: Pupils are equal, round, and reactive to light.  Neck:     Thyroid : No thyromegaly.     Vascular: No carotid bruit.     Comments: Kyphosis present back. Cardiovascular:     Rate and Rhythm: Normal rate and regular rhythm.     Heart sounds: Normal heart sounds. No murmur heard.    No gallop.  Pulmonary:     Effort: Pulmonary effort is normal. No accessory muscle usage or respiratory distress.     Breath sounds: Normal breath sounds. No decreased breath sounds, wheezing or rales.     Comments: Oxygen  by [facility] 2 L. Abdominal:     General: Bowel sounds are normal. There is no distension.     Palpations: Abdomen is soft.     Tenderness: There is no abdominal tenderness.  Musculoskeletal:     Cervical back: Normal range of motion and neck supple. Torticollis present.     Right lower leg: No edema.     Left lower leg: No edema.  Lymphadenopathy:     Cervical: No cervical adenopathy.  Skin:    General: Skin is warm and dry.  Neurological:     Mental Status:  She is alert and oriented to person, place, and time.     Deep Tendon Reflexes: Reflexes are normal and symmetric.     Reflex Scores:      Brachioradialis reflexes are 2+ on the right side and 2+ on the left side.      Patellar reflexes are 2+ on the right side and 2+ on the left side. Psychiatric:        Attention and Perception: Attention normal.        Mood and Affect: Mood normal.        Speech: Speech normal.        Behavior: Behavior normal. Behavior is cooperative.        Thought Content: Thought content normal.    Diabetic Foot Exam - Simple   Simple Foot Form Visual Inspection No deformities, no ulcerations, no other skin breakdown bilaterally: Yes Sensation Testing See comments: Yes Pulse Check Posterior Tibialis and Dorsalis pulse intact bilaterally: Yes Comments Sensation left 7/10 and right 7/10.     Results for orders placed or performed in visit on 03/21/24  Bayer DCA Hb A1c Waived   Collection Time: 03/21/24  9:32 AM  Result Value Ref Range   HB A1C (BAYER DCA - WAIVED) 7.4 (H) 4.8 - 5.6 %      Assessment & Plan:   Problem List Items Addressed This Visit       Cardiovascular and Mediastinum   Chronic HFrEF (heart failure with reduced ejection fraction) (HCC) (Chronic)   Chronic, ongoing with most recent 08/11/23 was 25-30%, a decline from previous EF 45-50% (10/22/22), followed by cardiology and HF Clinic.  Euvolemic today.  Continue current medication regimen and collaboration with cardiology.  Taking Entresto , will verify with HF Clinic that she should be taking this.  Recommend: - Reminded to call for an overnight weight gain of >2 pounds or a weekly weight gain of >5 pounds - not adding salt to food  and read food labels. Reviewed the importance of keeping daily sodium intake to 2000mg  daily - Avoid Ibuprofen products      Persistent atrial fibrillation (HCC)   Chronic, stable.  Rate well controlled.  Continue current medications and collaboration with  cardiology.      Hypertension associated with diabetes (HCC)   Chronic, stable.  BP well below goal in office today.  Recommend she continue to monitor BP at home regularly.  Focus on DASH diet.  Continue current medication regimen and adjust as needed, refills sent as needed.  LABS: up to date.  Urine ALB 80 February 2025.  Return in 3 months.       Relevant Orders   Bayer DCA Hb A1c Waived (Completed)     Respiratory   COPD, moderate (HCC) (Chronic)   Chronic, stable. Rare use of Albuterol . Continue Trelegy which offers benefit of medication minimization and has benefited symptoms.  Continue to collaborate with Dr. Theotis, recent notes reviewed.  Continue O2 use as ordered.        Endocrine   Diabetic peripheral neuropathy (HCC) (Chronic)   Chronic, ongoing with last A1c 7.4% today, slight trend down.  Urine ALB 80 February 2025. Continue current regimen and adjust as needed.  Did not tolerate Jardiance  in past + Mounjaro  made her sick.  Is tolerating Farxiga .  She monitors sugar levels frequently throughout daytime hours. - Vaccines up to date - Foot exam up to date.  Eye exam up to date. - ARB and statin on board.      Relevant Orders   Bayer DCA Hb A1c Waived (Completed)   Insulin  dependent type 2 diabetes mellitus (HCC) - Primary   Chronic, ongoing with last A1c 7.4% today, slight trend down.  Urine ALB 80 February 2025. Continue current regimen and adjust as needed.  Did not tolerate Jardiance  in past + Mounjaro  made her sick.  Is tolerating Farxiga .  She monitors sugar levels frequently throughout daytime hours. - Vaccines up to date - Foot exam up to date.  Eye exam up to date. - ARB and statin on board. - Will continue Metformin  at max dosing, Trulicity  4.5 MG weekly, Farxiga , and Tresiba  55 units, increase 3 units in 3 days if fasting sugar not less then 130 consistently recommended.  Continue meal time insulin , 6 units prior to meals and will increase as needed.  Educated  her on this.   - Freestyle use continues. - Recommend she continue to monitor BS consistently at home and document + focus heavily on diet changes.  She is aware to notify provider if fasting BS >130 consistently or <70, as may need to adjust insulin  further.          Relevant Orders   Bayer DCA Hb A1c Waived (Completed)   Hyperlipidemia associated with type 2 diabetes mellitus (HCC)   Chronic, stable.  Continue current medication regimen and adjust as needed.  Lipid panel today.      Relevant Orders   Bayer DCA Hb A1c Waived (Completed)   Lipid Panel w/o Chol/HDL Ratio     Genitourinary   CKD stage 3a, GFR 45-59 ml/min (HCC) (Chronic)   Ongoing and stable recent labs.  Continue Losartan  at low dose for heart and kidney protection + Farxiga .  Labs up to date.  Will refer to nephrology if any worsening in future.  May need to reduce Metformin  if eGFR <45.        Other   Chronic pain syndrome (Chronic)  Chronic, stable.  Continue to collaborate with chronic pain management locally, recent notes reviewed.       Opiate dependence, continuous (HCC)   Chronic, followed by pain management, will continue this collaboration.  Recent notes reviewed.      Obesity (BMI 30-39.9)   BMI 29.31.  Recommended eating smaller high protein, low fat meals more frequently and exercising 30 mins a day 5 times a week with a goal of 10-15lb weight loss in the next 3 months. Patient voiced their understanding and motivation to adhere to these recommendations.       Depression, major, single episode, in partial remission (HCC)   Chronic, ongoing.  Denies SI/HI.  Mood well-controlled at this time.  Continue current medication regimen and adjust as needed.           Follow up plan: Return in about 3 months (around 06/21/2024) for T2DM, HTN/HLD, COPD, HF, CKD, MOOD, ANEMIA, PAIN.

## 2024-03-21 NOTE — Assessment & Plan Note (Signed)
 Chronic, stable. Rare use of Albuterol. Continue Trelegy which offers benefit of medication minimization and has benefited symptoms.  Continue to collaborate with Dr. Meredeth Ide, recent notes reviewed.  Continue O2 use as ordered.

## 2024-03-21 NOTE — Assessment & Plan Note (Addendum)
 Chronic, stable.  Continue to collaborate with chronic pain management locally, recent notes reviewed.

## 2024-03-21 NOTE — Assessment & Plan Note (Signed)
 Chronic, ongoing.  Denies SI/HI.  Mood well-controlled at this time.  Continue current medication regimen and adjust as needed.

## 2024-03-21 NOTE — Assessment & Plan Note (Signed)
 Chronic, ongoing with last A1c 7.4% today, slight trend down.  Urine ALB 80 February 2025. Continue current regimen and adjust as needed.  Did not tolerate Jardiance  in past + Mounjaro  made her sick.  Is tolerating Farxiga .  She monitors sugar levels frequently throughout daytime hours. - Vaccines up to date - Foot exam up to date.  Eye exam up to date. - ARB and statin on board. - Will continue Metformin  at max dosing, Trulicity  4.5 MG weekly, Farxiga , and Tresiba  55 units, increase 3 units in 3 days if fasting sugar not less then 130 consistently recommended.  Continue meal time insulin , 6 units prior to meals and will increase as needed.  Educated her on this.   - Freestyle use continues. - Recommend she continue to monitor BS consistently at home and document + focus heavily on diet changes.  She is aware to notify provider if fasting BS >130 consistently or <70, as may need to adjust insulin  further.

## 2024-03-21 NOTE — Assessment & Plan Note (Signed)
 Ongoing and stable recent labs.  Continue Losartan  at low dose for heart and kidney protection + Farxiga .  Labs up to date.  Will refer to nephrology if any worsening in future.  May need to reduce Metformin  if eGFR <45.

## 2024-03-21 NOTE — Assessment & Plan Note (Signed)
 Chronic, stable.  BP well below goal in office today.  Recommend she continue to monitor BP at home regularly.  Focus on DASH diet.  Continue current medication regimen and adjust as needed, refills sent as needed.  LABS: up to date.  Urine ALB 80 February 2025.  Return in 3 months.

## 2024-03-21 NOTE — Assessment & Plan Note (Signed)
 Chronic, stable.  Continue current medication regimen and adjust as needed.  Lipid panel today.

## 2024-03-21 NOTE — Assessment & Plan Note (Signed)
 Chronic, followed by pain management, will continue this collaboration.  Recent notes reviewed.

## 2024-03-22 ENCOUNTER — Ambulatory Visit: Payer: Self-pay | Admitting: Nurse Practitioner

## 2024-03-22 LAB — LIPID PANEL W/O CHOL/HDL RATIO
Cholesterol, Total: 132 mg/dL (ref 100–199)
HDL: 62 mg/dL (ref >39)
LDL Chol Calc (NIH): 54 mg/dL (ref 0–99)
Triglycerides: 82 mg/dL (ref 0–149)
VLDL Cholesterol Cal: 16 mg/dL (ref 5–40)

## 2024-03-22 NOTE — Progress Notes (Signed)
 Contacted via MyChart  Good morning Jordan Chan, your labs have returned and lipid panel remains stable, no changes needed.  Great news!! Keep being stellar!!  Thank you for allowing me to participate in your care.  I appreciate you. Kindest regards, Lajada Janes

## 2024-03-31 ENCOUNTER — Telehealth: Payer: Self-pay | Admitting: Family

## 2024-03-31 NOTE — Progress Notes (Unsigned)
 [facility] Note     PCP: [name], NP (telemedicine visit 05/25) Cardiologist: Lonni Hanson, MD (last seen 11/24)  Chief Complaint: fatigue   HPI:  Jordan Chan is a 72 y/o female with a history of B12 deficiency, chronic pain syndrome s/p intrathecal pump, paroxysmal atrial fibrillation, stress-induced cardiomyopathy in the setting of pain pump malfunction and opioid withdrawal (2018), HTN, hyperlipidemia, PE (2021), DM, COPD, torticollis, GERD, OSA, breast cancer, chronic back pain,anemia and IBS.   Echo 12/11/20: EF 45% with Grade II DD Echo 09/11/21: EF 25-30% with Grade II DD Echo 10/31/21: EF 40-45% with Grade I DD  Admitted 04/15/23 due to worsening of left hip pain at the surgical site associated with increasing redness about the left hip surgical wound. Tachycardic. WBC 17.6. CXR normal.Thought to be septic due to left hip surgical wound infection. Treated with IV fluids, analgesics and empiric IV antibiotics. [facility]    Admitted 08/09/23 due to acute respiratory distress found with sats in the 50 to 60% on RA. Found to have multiple PE's and sepsis due to aspiration pneumonia. Initially intubated. Completed antibiotics & successfully extubated. Underwent swallowing evaluation. Heparin  stopped and transitioned to eliquis . Anemia stable. Echo 08/11/23: EF 25-30% with Grade I DD  She presents today for a HF follow-up visit with fatigue. Has shortness of breath, chronic neck pain from torticollis and chronic difficulty sleeping from the pain. Denies chest pain, palpitations, abdominal distention, pedal edema or dizziness. She has not started entresto  yet as staff just reached patient yesterday so she decided to wait until her appt to discuss further.   Wearing oxygen  at 2L around the clock. Quit smoking ~2006 when a grandchild was going to be born. Denies alcohol  use. Not adding salt to her food. Weighing daily    Previous cardiac studies:  [facility] 04/28/17:  No angiographically significant coronary artery disease. Severely reduced LV contraction (LVEF 25-30%) with mid and apical akinesis, consistent with Takotsubo (stress-induced) cardiomyopathy. Mildly elevated left ventricular filling pressure.  ROS: All systems negative except what is listed in HPI, PMH and Problem List  Past Medical History:  Diagnosis Date   (HFpEF) heart failure with preserved ejection fraction (HCC)    Anemia    Anxiety    Aortic atherosclerosis (HCC)    Arthritis    Asthma    Bilateral pulmonary embolism (small subsegmental) 08/11/2023   Breast cancer, right (HCC) 1998   a.) Tx'd with surgical excision + chemotherapy + XRT   Bronchitis    Cerebral microvascular disease    Chronic pain syndrome    CKD (chronic kidney disease), stage III (HCC)    COPD (chronic obstructive pulmonary disease) (HCC)    DDD (degenerative disc disease), cervical    Decreased ROM of intervertebral discs of cervical spine    Dependence on nocturnal oxygen  therapy (2L/[place])    Depression    Diabetes mellitus type 2, insulin  dependent (HCC)    Diverticulitis    Dyspnea    Endometriosis    GERD (gastroesophageal reflux disease)    History of shingles    Hypercholesteremia    Hypertension    IBS (irritable bowel syndrome)    Low back pain    a.) has intrathecal pump (morphine /bupivicaine/clonidine )   Neuropathy    On apixaban  therapy    Opioid withdrawal (HCC)    a.) experiences withdrawal symptoms when batteries in intrathecal pump reach EOL   Orthopnea    PAF (paroxysmal atrial fibrillation) (HCC)  a.) CHA2DS2VASc = 8 (age, sex, HFpEF, HTN, TIA/PE x 2, vascular disease, T2DM) as of metoprolol  succinate; b.) rate/rhythm maintained on oral metoprolol  succinate; chronically anticoagulated with apixaban    Personal history of chemotherapy    Personal history of radiation therapy    Pneumonia    Scoliosis    Sleep apnea    a.) no  nocturnal PAP therapy; requires supplemental oxygen  at bedtime   Status post bilateral cataract extraction 2020   Takotsubo cardiomyopathy    TIA (transient ischemic attack) 2010    Current Outpatient Medications  Medication Sig Dispense Refill   ACCU-CHEK AVIVA PLUS test strip TEST THREE TIMES DAILY 300 strip 3   Accu-Chek Softclix Lancets lancets TEST BLOOD SUGAR THREE TIMES DAILY 300 each 3   albuterol  (PROVENTIL ) (2.5 MG/3ML) 0.083% nebulizer solution Take 3 mLs (2.5 mg total) by nebulization every 4 (four) hours as needed for wheezing or shortness of breath. 75 mL 1   albuterol  (VENTOLIN  HFA) 108 (90 Base) MCG/ACT inhaler Inhale 2 puffs into the lungs every 6 (six) hours as needed for wheezing or shortness of breath. 1 each 1   apixaban  (ELIQUIS ) 5 MG TABS tablet Take 1 tablet (5 mg total) by mouth 2 (two) times daily. 180 tablet 3   atorvastatin  (LIPITOR) 40 MG tablet Take 1 tablet (40 mg total) by mouth daily. 90 tablet 3   Blood Glucose Monitoring Suppl (TRUE METRIX METER) w/Device KIT Use to check blood sugar 4 times a day 1 kit 4   busPIRone  (BUSPAR ) 5 MG tablet Take 1 tablet (5 mg total) by mouth 2 (two) times daily. 30 tablet 1   cholecalciferol  (VITAMIN D3) 25 MCG (1000 UNIT) tablet Take 1,000 Units by mouth daily.     Continuous Glucose Receiver (FREESTYLE LIBRE 2 READER) DEVI To check blood sugars 3-4 times daily due to insulin  use. DX E11.40 2 each 5   Continuous Glucose Sensor (FREESTYLE LIBRE 2 SENSOR) MISC USE 1 SENSOR TO CHECK BLOOD SUGARS 3-4 TIMES DAILY DUE TO INSULIN  USE 2 each 5   Cyanocobalamin  1000 MCG/ML KIT Inject 1,000 mcg as directed every 30 (thirty) days. Next injection 3/21     cyclobenzaprine  (FLEXERIL ) 5 MG tablet Take 5 mg by mouth daily as needed for muscle spasms.     dapagliflozin  propanediol (FARXIGA ) 10 MG TABS tablet Take 1 tablet (10 mg total) by mouth daily before breakfast. 90 tablet 3   docusate sodium  (COLACE) 100 MG capsule Take 1 capsule (100 mg  total) by mouth 2 (two) times daily as needed for mild constipation. 30 capsule 0   Dulaglutide  (TRULICITY ) 4.5 MG/0.5ML SOAJ Inject 4.5 mg into the skin once a week. 12 mL 3   ENTRESTO  24-26 MG Take 1 tablet by mouth 2 (two) times daily.     folic acid  (FOLVITE ) 1 MG tablet TAKE 1 TABLET EVERY DAY 90 tablet 0   furosemide  (LASIX ) 40 MG tablet Take 0.5 tablets (20 mg total) by mouth daily. 30 tablet 1   gabapentin  (NEURONTIN ) 400 MG capsule Take 1 capsule (400 mg total) by mouth 3 (three) times daily. 90 capsule 12   insulin  detemir (LEVEMIR ) 100 UNIT/ML injection 30 Units.     insulin  lispro (HUMALOG  KWIKPEN) 100 UNIT/ML KwikPen Inject 5 Units into the skin 3 (three) times daily before meals. Do not inject insulin  if you are not going to eat meal.  Only take before a meal. 15 mL 3   metFORMIN  (GLUCOPHAGE ) 1000 MG tablet Take 1 tablet (1,000  mg total) by mouth 2 (two) times daily with a meal. 180 tablet 3   metoprolol  succinate (TOPROL -XL) 50 MG 24 hr tablet Take 1 tablet (50 mg total) by mouth daily. Take with or immediately following a meal. 90 tablet 3   montelukast  (SINGULAIR ) 10 MG tablet Take 1 tablet (10 mg total) by mouth at bedtime. 90 tablet 3   naloxone  (NARCAN ) nasal spray 4 mg/0.1 mL Place 1 spray into the nose as needed for up to 365 doses (for opioid-induced respiratory depresssion). In case of emergency (overdose), spray once into each nostril. If no response within 3 minutes, repeat application and call 911. 1 each 1   Nebulizers (EASY AIR COMPRESSOR NEBULIZER) MISC Use to inhaler nebulizer treatments at needed per instructions on nebulizer prescription 1 each 0   OXYGEN  Inhale 2 L into the lungs at bedtime.     PAIN MANAGEMENT INTRATHECAL, IT, PUMP 1 each by Intrathecal route. Intrathecal (IT) medication:  Morphine  5.0 mg/ml, Bupivacaine  20.0 mg/ml,  Clonidine  100.0 mcg/ml Patient does not remember current. Adjusted every 3 months at [facility]     pantoprazole  (PROTONIX )  40 MG tablet Take 1 tablet (40 mg total) by mouth daily. 90 tablet 3   TRELEGY ELLIPTA  100-62.5-25 MCG/ACT AEPB INHALE 1 PUFF INTO THE LUNGS DAILY . 42 DAY EXPIRATION AFTER OPENING FOIL TRAY 180 each 3   TRESIBA  FLEXTOUCH 100 UNIT/ML FlexTouch Pen Inject 55 Units into the skin at bedtime. 30 mL 5   venlafaxine  XR (EFFEXOR -XR) 150 MG 24 hr capsule Take 1 capsule (150 mg total) by mouth daily. 90 capsule 3   Current Facility-Administered Medications  Medication Dose Route Frequency Provider Last Rate Last Admin   cyanocobalamin  (VITAMIN B12) injection 1,000 mcg  1,000 mcg Intramuscular Q30 days [name], Jolene T, NP   1,000 mcg at 03/21/24 9066    Allergies  Allergen Reactions   Other Palpitations    IV steroids   Pain Patch [Menthol] Anaphylaxis   Jardiance  [Empagliflozin ] Other (See Comments)    Yeast infection & dry mouth   Prednisone     Avelox  [Moxifloxacin  Hcl In Nacl] Other (See Comments)    Upset stomach   Doxycycline  Diarrhea   Erythromycin  Nausea Only and Other (See Comments)    Has used azithromycin  without documented ADRs   Fentanyl  Nausea Only and Rash   Moxifloxacin  Hcl Other (See Comments)    Upset stomach   Oxycontin [Oxycodone] Hives   Ozempic [Semaglutide] Nausea Only      Social History   Socioeconomic History   Marital status: Married    Spouse name: Darina   Number of children: 2   Years of education: Not on file   Highest education level: High school graduate  Occupational History   Occupation: retired  Tobacco Use   Smoking status: Former    Current packs/day: 0.00    Average packs/day: 1 pack/day for 30.0 years (30.0 ttl pk-yrs)    Types: Cigarettes    Start date: 04/30/1974    Quit date: 04/30/2004    Years since quitting: 19.9    Passive exposure: Past   Smokeless tobacco: Never  Vaping Use   Vaping status: Never Used  Substance and Sexual Activity   Alcohol  use: No   Drug use: No   Sexual activity: Not Currently    Birth  control/protection: Post-menopausal  Other Topics Concern   Not on file  Social History Narrative   ** Merged History Encounter ** ** Merged History Encounter **  Lives in home with mentally handicap son   Social Drivers of Corporate investment banker Strain: Low Risk  (01/29/2024)   Received from [facility]   Overall Financial Resource Strain (CARDIA)    Difficulty of Paying Living Expenses: Not hard at all  Food Insecurity: No Food Insecurity (01/29/2024)   Received from [facility]   Hunger Vital Sign    Within the past 12 months, you worried that your food would run out before you got the money to buy more.: Never true    Within the past 12 months, the food you bought just didn't last and you didn't have money to get more.: Never true  Transportation Needs: No Transportation Needs (01/29/2024)   Received from [facility] - Transportation    In the past 12 months, has lack of transportation kept you from medical appointments or from getting medications?: No    Lack of Transportation (Non-Medical): No  Physical Activity: Inactive (12/29/2023)   Exercise Vital Sign    Days of Exercise per Week: 0 days    Minutes of Exercise per Session: 0 min  Stress: No Stress Concern Present (12/29/2023)   [employer] of Occupational Health - Occupational Stress Questionnaire    Feeling of Stress : Only a little  Social Connections: Moderately Isolated (12/29/2023)   Social Connection and Isolation Panel    Frequency of Communication with Friends and Family: More than three times a week    Frequency of Social Gatherings with Friends and Family: More than three times a week    Attends Religious Services: More than 4 times per year    Active Member of [employer] or Organizations: No    Attends Banker Meetings: Never    Marital Status: Separated  Intimate Partner Violence: Not At Risk (12/29/2023)   Humiliation,  Afraid, Rape, and Kick questionnaire    Fear of Current or Ex-Partner: No    Emotionally Abused: No    Physically Abused: No    Sexually Abused: No      Family History  Problem Relation Age of Onset   Cancer Mother 4       lung   Thyroid  disease Mother    Cancer Father 37       Pancreatic   Hypertension Sister    Cancer Sister        cancer on face   Hyperlipidemia Sister    Osteoporosis Maternal Grandmother    Cancer Paternal Grandmother        colon   Arthritis Paternal Grandfather    Hyperlipidemia Son    Seizures Son    Breast cancer Neg Hx    There were no vitals filed for this visit.  Wt Readings from Last 3 Encounters:  03/21/24 142 lb 3.2 oz (64.5 kg)  03/01/24 142 lb 3.2 oz (64.5 kg)  02/22/24 142 lb (64.4 kg)   Lab Results  Component Value Date   CREATININE 1.09 (H) 03/01/2024   CREATININE 1.06 (H) 01/28/2024   CREATININE 1.33 (H) 01/14/2024    PHYSICAL EXAM:  General: Well appearing. No resp difficulty HEENT: normal Neck: supple, no JVD, head is being pulled to the left due to torticollis Cor: Regular rhythm, rate. No rubs, gallops or murmurs Lungs: clear Abdomen: soft, nontender, nondistended. Extremities: no cyanosis, clubbing, rash, edema Neuro: alert & oriented X 3. Moves all 4 extremities w/o difficulty. Affect pleasant    ECG: not done  ASSESSMENT & PLAN:  1: NICM with reduced ejection fraction- - suspect due to stress induced after pain pump malfunction & opioid withdrawal in 2018 - NYHA class II - euvolemic - weighing daily; reminded to call for overnight weight gain of > 2 pounds or weekly weight gain of > 5 pounds - weight up 4 pounds from last visit here 2 weeks ago - Echo 12/11/20: EF 45% with Grade II DD - Echo 09/11/21: EF 25-30% with Grade II DD - Echo 10/31/21: EF 40-45% with Grade I DD - Echo 08/11/23: EF 25-30% with Grade I DD - continue farxiga  10mg  daily - continue furosemide  20mg  daily - continue losartan  12.5mg   daily; discussed changing to entresto  24/26mg  BID; patient is agreeable but we will get updated labs back today to make sure K+ has not risen further - continue metoprolol  succinate 25mg  daily - recent potassium too high for MRA - BMET today - not adding salt to her food - BNP 08/09/23 was 241.9  2: HTN- - BP 121/57 - saw PCP Felton) 05/25 - BMET 12/31/23 reviewed: sodium 142, potassium 5.2, creatinine 1.13 & GFR 51 - BMET today  3: DM- - A1c 06/22/23 was 7.4% - continue insulin  - continue metformin  1000mg  BID  4: COPD- - saw pulmonology Alica) 05/25 - wearing oxygen  at 2L around the clock but does take it off at times at home  5: Atrial fibrillation- - saw cardiology (End) 11/24 - continue apixaban  5mg  BID - continue metoprolol  succinate 25mg  daily  6: Chronic pain- - follows with pain clinic - has chronic intrathecal pain pump; due to be replaced 07/25 - continue gabapentin  400mg  TID  7: Anemia- - Hg 12/18/23 reviewed and was 12.0 - saw GI Elodie) 03/25 - continue ferrous sulfate  325mg  BID    Return in 1 month, sooner if needed  Ellouise DELENA Class, FNP 03/31/24

## 2024-03-31 NOTE — Telephone Encounter (Signed)
 Called to confirm/remind patient of their appointment at the [facility] on 04/01/24.   Appointment:   [x] Confirmed  [] Left mess   [] No answer/No voice mail  [] VM Full/unable to leave message  [] Phone not in service  Patient reminded to bring all medications and/or complete list.  Confirmed patient has transportation. Gave directions, instructed to utilize valet parking.

## 2024-04-01 ENCOUNTER — Ambulatory Visit: Attending: Family | Admitting: Family

## 2024-04-01 ENCOUNTER — Encounter: Payer: Self-pay | Admitting: Family

## 2024-04-01 VITALS — BP 111/64 | HR 89 | Wt 142.8 lb

## 2024-04-01 DIAGNOSIS — D508 Other iron deficiency anemias: Secondary | ICD-10-CM

## 2024-04-01 DIAGNOSIS — E119 Type 2 diabetes mellitus without complications: Secondary | ICD-10-CM

## 2024-04-01 DIAGNOSIS — M069 Rheumatoid arthritis, unspecified: Secondary | ICD-10-CM | POA: Diagnosis not present

## 2024-04-01 DIAGNOSIS — Z7985 Long-term (current) use of injectable non-insulin antidiabetic drugs: Secondary | ICD-10-CM | POA: Diagnosis not present

## 2024-04-01 DIAGNOSIS — D631 Anemia in chronic kidney disease: Secondary | ICD-10-CM | POA: Insufficient documentation

## 2024-04-01 DIAGNOSIS — K219 Gastro-esophageal reflux disease without esophagitis: Secondary | ICD-10-CM | POA: Insufficient documentation

## 2024-04-01 DIAGNOSIS — M25552 Pain in left hip: Secondary | ICD-10-CM | POA: Insufficient documentation

## 2024-04-01 DIAGNOSIS — I5020 Unspecified systolic (congestive) heart failure: Secondary | ICD-10-CM | POA: Diagnosis not present

## 2024-04-01 DIAGNOSIS — I4819 Other persistent atrial fibrillation: Secondary | ICD-10-CM

## 2024-04-01 DIAGNOSIS — N183 Chronic kidney disease, stage 3 unspecified: Secondary | ICD-10-CM | POA: Diagnosis not present

## 2024-04-01 DIAGNOSIS — Z87891 Personal history of nicotine dependence: Secondary | ICD-10-CM | POA: Insufficient documentation

## 2024-04-01 DIAGNOSIS — Z79899 Other long term (current) drug therapy: Secondary | ICD-10-CM | POA: Diagnosis not present

## 2024-04-01 DIAGNOSIS — I10 Essential (primary) hypertension: Secondary | ICD-10-CM

## 2024-04-01 DIAGNOSIS — Z86711 Personal history of pulmonary embolism: Secondary | ICD-10-CM | POA: Diagnosis not present

## 2024-04-01 DIAGNOSIS — I5032 Chronic diastolic (congestive) heart failure: Secondary | ICD-10-CM | POA: Diagnosis not present

## 2024-04-01 DIAGNOSIS — J4489 Other specified chronic obstructive pulmonary disease: Secondary | ICD-10-CM | POA: Insufficient documentation

## 2024-04-01 DIAGNOSIS — I130 Hypertensive heart and chronic kidney disease with heart failure and stage 1 through stage 4 chronic kidney disease, or unspecified chronic kidney disease: Secondary | ICD-10-CM | POA: Diagnosis not present

## 2024-04-01 DIAGNOSIS — K589 Irritable bowel syndrome without diarrhea: Secondary | ICD-10-CM | POA: Insufficient documentation

## 2024-04-01 DIAGNOSIS — Z7901 Long term (current) use of anticoagulants: Secondary | ICD-10-CM | POA: Diagnosis not present

## 2024-04-01 DIAGNOSIS — I428 Other cardiomyopathies: Secondary | ICD-10-CM | POA: Diagnosis not present

## 2024-04-01 DIAGNOSIS — Z7984 Long term (current) use of oral hypoglycemic drugs: Secondary | ICD-10-CM | POA: Insufficient documentation

## 2024-04-01 DIAGNOSIS — G4733 Obstructive sleep apnea (adult) (pediatric): Secondary | ICD-10-CM | POA: Diagnosis not present

## 2024-04-01 DIAGNOSIS — J449 Chronic obstructive pulmonary disease, unspecified: Secondary | ICD-10-CM | POA: Diagnosis not present

## 2024-04-01 DIAGNOSIS — E7800 Pure hypercholesterolemia, unspecified: Secondary | ICD-10-CM | POA: Diagnosis not present

## 2024-04-01 DIAGNOSIS — E1122 Type 2 diabetes mellitus with diabetic chronic kidney disease: Secondary | ICD-10-CM | POA: Insufficient documentation

## 2024-04-01 DIAGNOSIS — E1140 Type 2 diabetes mellitus with diabetic neuropathy, unspecified: Secondary | ICD-10-CM | POA: Insufficient documentation

## 2024-04-01 DIAGNOSIS — Z794 Long term (current) use of insulin: Secondary | ICD-10-CM | POA: Diagnosis not present

## 2024-04-01 DIAGNOSIS — Z9981 Dependence on supplemental oxygen: Secondary | ICD-10-CM | POA: Diagnosis not present

## 2024-04-01 DIAGNOSIS — I480 Paroxysmal atrial fibrillation: Secondary | ICD-10-CM | POA: Diagnosis not present

## 2024-04-01 DIAGNOSIS — G894 Chronic pain syndrome: Secondary | ICD-10-CM

## 2024-04-01 LAB — BASIC METABOLIC PANEL WITH GFR
BUN/Creatinine Ratio: 17 (ref 12–28)
BUN: 24 mg/dL (ref 8–27)
CO2: 25 mmol/L (ref 20–29)
Calcium: 9.6 mg/dL (ref 8.7–10.3)
Chloride: 94 mmol/L — ABNORMAL LOW (ref 96–106)
Creatinine, Ser: 1.38 mg/dL — ABNORMAL HIGH (ref 0.57–1.00)
Glucose: 145 mg/dL — ABNORMAL HIGH (ref 70–99)
Potassium: 4.9 mmol/L (ref 3.5–5.2)
Sodium: 138 mmol/L (ref 134–144)
eGFR: 40 mL/min/1.73 — ABNORMAL LOW (ref >59)

## 2024-04-01 NOTE — Patient Instructions (Addendum)
 Medication Changes:  No medication changes.   Lab Work:  Go over to the LOWER LEVEL and have your blood work completed.  We will only call you if the results are abnormal or if the provider would like to make medication changes.  No news is good news.   Follow-Up in: 4 months with Ellouise Class, FNP.   Thank you for choosing [facility].    At the [facility], you and your health needs are our priority. We have a designated team specialized in the treatment of Heart Failure. This Care Team includes your primary Heart Failure Specialized Cardiologist (physician), Advanced Practice Providers (APPs- Physician Assistants and Nurse Practitioners), and Pharmacist who all work together to provide you with the care you need, when you need it.   You may see any of the following providers on your designated Care Team at your next follow up:  Dr. Toribio Fuel Dr. Ezra Shuck Dr. Ria Commander Dr. Morene Brownie Ellouise Class, FNP Jaun Bash, RPH-CPP  Please be sure to bring in all your medications bottles to every appointment.   Need to Contact Us :  If you have any questions or concerns before your next appointment please send us  a message through [place] or call our office at [phone].    TO LEAVE A MESSAGE FOR THE NURSE SELECT OPTION 2, PLEASE LEAVE A MESSAGE INCLUDING: YOUR NAME DATE OF BIRTH CALL BACK NUMBER REASON FOR CALL**this is important as we prioritize the call backs  YOU WILL RECEIVE A CALL BACK THE SAME DAY AS LONG AS YOU CALL BEFORE 4:00 PM

## 2024-04-05 ENCOUNTER — Ambulatory Visit: Payer: Self-pay | Admitting: Family

## 2024-04-05 DIAGNOSIS — I5022 Chronic systolic (congestive) heart failure: Secondary | ICD-10-CM

## 2024-04-05 NOTE — Telephone Encounter (Signed)
 Spoke to pt about lab work. Pt states that she will drink 60-64 oz of fluid daily. Pt [facility] when she gets pain pump refill.

## 2024-04-06 DIAGNOSIS — R0609 Other forms of dyspnea: Secondary | ICD-10-CM | POA: Diagnosis not present

## 2024-04-06 DIAGNOSIS — Z9981 Dependence on supplemental oxygen: Secondary | ICD-10-CM | POA: Diagnosis not present

## 2024-04-06 DIAGNOSIS — J449 Chronic obstructive pulmonary disease, unspecified: Secondary | ICD-10-CM | POA: Diagnosis not present

## 2024-04-06 NOTE — Progress Notes (Unsigned)
 Cardiology Office Note    Date:  04/07/2024   ID:  Jordan Chan, DOB 1951/05/31, MRN [id number]  PCP:  Jordan Melanie DASEN, NP  Cardiologist:  Lonni Hanson, MD  Electrophysiologist:  None   Chief Complaint: Follow-up  History of Present Illness:   Jordan Chan is a 73 y.o. female with history of nonobstructive CAD by LHC in 2018, stress-induced cardiomyopathy in 2018 in the setting of pain pump malfunction and opioid withdrawal with subsequent improvement in LV systolic function with recurrent cardiomyopathy by echo in 08/2023, Afib diagnosed in 09/2021 on apixaban , PE following hospitalization for pneumonia in the summer 2021 treated with apixaban  with recurrent PE in 08/2023 occurring while on and adherent to apixaban , chronic low back pain, DM, COPD, sleep apnea intolerant to CPAP on nocturnal oxygen , IBS, MGUS, and GERD who presents for follow-up of CAD, cardiomyopathy, and A-fib.  Echo in 04/2017 showed an EF of 25-30%, severe hypokinesis of the mid and apical myocardium suggestive of stress-induced cardiomyopathy, mild left atrial enlargement, and a poorly visualized RV that was grossly normal in size.  LHC at that time showed no angiographically significant CAD with a severely reduced LVEF estimated at 25 to 30% with mid and apical akinesis consistent with stress-induced cardiomyopathy.  Repeat echo in 05/2017, through [facility], showed an improving LV systolic function with an EF of 45 to 50%, global hypokinesis, normal LV size and wall thickness, grade 1 diastolic dysfunction, and normal RV systolic function and ventricular cavity size.  Limited echo in 08/2017 showed a low normal LV systolic function with an EF of 50 to 55%.  Echo in 07/2020 showed an EF of 45 to 50%, global hypokinesis, grade 2 diastolic dysfunction, normal RV systolic function and ventricular cavity size, no pericardial effusion, no significant valvular abnormalities, and a normal CVP.  Echo from 12/2020,  performed in the context of feeling short of breath and fatigued showed an EF of 45% with global hypokinesis, grade 2 diastolic dysfunction, normal RV systolic function and ventricular cavity size, no pericardial effusion, and no significant valvular abnormalities.  She was admitted to the hospital in 09/2021 with acute on chronic hypoxic respiratory failure and septic shock with admission notable for new onset A-fib with RVR.  She converted on amiodarone  drip and was placed on apixaban .  Echo in 09/2021 showed an EF of 25 to 30%, global hypokinesis, grade 2 diastolic dysfunction, moderately reduced RV systolic function, and no significant valvular abnormality.  Limited echo in 10/2021 showed an EF of 40 to 45%, no regional wall motion abnormalities, grade 1 diastolic dysfunction, normal RV systolic function and ventricular cavity size, no significant valvular abnormality, and an estimated right atrial pressure of 3 mmHg.  Lexiscan  MPI from 11/2021 showed no significant ischemia with an EF of 67% and was overall low risk.  Echo from 10/2022 showed an EF of 45 to 50%, global hypokinesis, normal RV systolic function and ventricular cavity size, no significant valvular abnormality, and an estimated right atrial pressure of 3 mmHg.    She was last seen in our office in 06/2023 and was euvolemic.  It was recommended she continue apixaban  given history of A-fib.  She was admitted in 08/2023 with acute on chronic hypoxic and hypercapnic respiratory failure secondary to multiple subsegmental PE and aspiration pneumonia.  She was transition from apixaban  to heparin  drip with initiation of apixaban  at time of discharge.  Echo from 08/2023 showed recurrent cardiomyopathy with an EF of 25 to 30%, global  hypokinesis, grade 1 diastolic dysfunction, low normal RV systolic function with normal ventricular cavity size, and trivial mitral regurgitation.    Throughout 2025, she has been followed by the [facility] with  escalation of GDMT as able.    She comes in today and indicates she is doing very well from a cardiac perspective.  She is now on supplemental oxygen  during the day, particularly with exertion.  She is without chest pain or symptoms of cardiac decompensation.  Chronic dyspnea is stable.  No lower extremity swelling or progressive orthopnea.  She reports that she has been adherent to apixaban , taking this twice daily every day and denies missing any doses leading up to her admission in 08/2023, at which time she was diagnosed with recurrent PE on anticoagulation.  No falls or symptoms concerning for bleeding.  No dizziness, presyncope, or syncope.  Weight stable.   Labs independently reviewed: 03/2024 - BUN 24, serum creatinine 1.38, potassium 4.9, TC 132, TG 82, HDL 63, LDL 54, A1c 7.4 02/2024 - Hgb 11.8, PLT 439, albumin 3.8, AST/ALT normal 12/2023 - TSH normal  Past Medical History:  Diagnosis Date   (HFpEF) heart failure with preserved ejection fraction (HCC)    Anemia    Anxiety    Aortic atherosclerosis (HCC)    Arthritis    Asthma    Bilateral pulmonary embolism (small subsegmental) 08/11/2023   Breast cancer, right (HCC) 1998   a.) Tx'd with surgical excision + chemotherapy + XRT   Bronchitis    Cerebral microvascular disease    Chronic pain syndrome    CKD (chronic kidney disease), stage III (HCC)    COPD (chronic obstructive pulmonary disease) (HCC)    DDD (degenerative disc disease), cervical    Decreased ROM of intervertebral discs of cervical spine    Dependence on nocturnal oxygen  therapy (2L/[place])    Depression    Diabetes mellitus type 2, insulin  dependent (HCC)    Diverticulitis    Dyspnea    Endometriosis    GERD (gastroesophageal reflux disease)    History of shingles    Hypercholesteremia    Hypertension    IBS (irritable bowel syndrome)    Low back pain    a.) has intrathecal pump (morphine /bupivicaine/clonidine )   Neuropathy    On apixaban  therapy    Opioid  withdrawal (HCC)    a.) experiences withdrawal symptoms when batteries in intrathecal pump reach EOL   Orthopnea    PAF (paroxysmal atrial fibrillation) (HCC)    a.) CHA2DS2VASc = 8 (age, sex, HFpEF, HTN, TIA/PE x 2, vascular disease, T2DM) as of metoprolol  succinate; b.) rate/rhythm maintained on oral metoprolol  succinate; chronically anticoagulated with apixaban    Personal history of chemotherapy    Personal history of radiation therapy    Pneumonia    Scoliosis    Sleep apnea    a.) no nocturnal PAP therapy; requires supplemental oxygen  at bedtime   Status post bilateral cataract extraction 2020   Takotsubo cardiomyopathy    TIA (transient ischemic attack) 2010    Past Surgical History:  Procedure Laterality Date   ABDOMINAL HYSTERECTOMY  1987   BACK SURGERY     Tailbone removed following fracture   BREAST SURGERY Right    mastectomy   CARDIAC CATHETERIZATION     CATARACT EXTRACTION W/PHACO Right 05/19/2019   Procedure: CATARACT EXTRACTION PHACO AND INTRAOCULAR LENS PLACEMENT (IOC), RIGHT, DIABETIC;  Surgeon: Ferol Rogue, MD;  Location: [facility] ORS;  Service: Ophthalmology;  Laterality: Right;  Lot # [id number]  US : 00:43.8 CDE: 4.59   CATARACT EXTRACTION W/PHACO Left 06/16/2019   Procedure: CATARACT EXTRACTION PHACO AND INTRAOCULAR LENS PLACEMENT (IOC) LEFT VISION BLUE DIABETIC;  Surgeon: Ferol Rogue, MD;  Location: [facility] ORS;  Service: Ophthalmology;  Laterality: Left;  Lot #[id number] US : 00:46.9 CDE: 6.53   COCCYX REMOVAL     COLONOSCOPY WITH PROPOFOL  N/A 01/01/2017   Procedure: COLONOSCOPY WITH PROPOFOL ;  Surgeon: Therisa Bi, MD;  Location: [facility] ENDOSCOPY;  Service: Endoscopy;  Laterality: N/A;   COLONOSCOPY WITH PROPOFOL  N/A 12/11/2021   Procedure: COLONOSCOPY WITH PROPOFOL ;  Surgeon: Unk Corinn Skiff, MD;  Location: [facility] ENDOSCOPY;  Service: Gastroenterology;  Laterality: N/A;   ELBOW ARTHROSCOPY WITH TENDON RECONSTRUCTION     ESOPHAGOGASTRODUODENOSCOPY (EGD) WITH  PROPOFOL  N/A 01/01/2017   Procedure: ESOPHAGOGASTRODUODENOSCOPY (EGD) WITH PROPOFOL ;  Surgeon: Therisa Bi, MD;  Location: [facility] ENDOSCOPY;  Service: Endoscopy;  Laterality: N/A;   ESOPHAGOGASTRODUODENOSCOPY (EGD) WITH PROPOFOL  N/A 12/11/2021   Procedure: ESOPHAGOGASTRODUODENOSCOPY (EGD) WITH PROPOFOL ;  Surgeon: Unk Corinn Skiff, MD;  Location: [facility] ENDOSCOPY;  Service: Gastroenterology;  Laterality: N/A;   INTRATHECAL PUMP IMPLANT     LEFT HEART CATH AND CORONARY ANGIOGRAPHY N/A 04/28/2017   Procedure: LEFT HEART CATH AND CORONARY ANGIOGRAPHY;  Surgeon: Mady Bruckner, MD;  Location: [facility] INVASIVE CV LAB;  Service: Cardiovascular;  Laterality: N/A;   MASTECTOMY Right 06/1997   morphine  pump  2011   PAIN PUMP REVISION N/A 02/11/2024   Procedure: PAIN PUMP REPLACEMENT;  Surgeon: Tanya Glisson, MD;  Location: [facility] ORS;  Service: Neurosurgery;  Laterality: N/A;   PLANTAR FASCIA RELEASE     TOTAL HIP ARTHROPLASTY Left    TRIGGER FINGER RELEASE      Current Medications: Current Meds  Medication Sig   ACCU-CHEK AVIVA PLUS test strip TEST THREE TIMES DAILY   Accu-Chek Softclix Lancets lancets TEST BLOOD SUGAR THREE TIMES DAILY   albuterol  (PROVENTIL ) (2.5 MG/3ML) 0.083% nebulizer solution Take 3 mLs (2.5 mg total) by nebulization every 4 (four) hours as needed for wheezing or shortness of breath.   albuterol  (VENTOLIN  HFA) 108 (90 Base) MCG/ACT inhaler Inhale 2 puffs into the lungs every 6 (six) hours as needed for wheezing or shortness of breath.   apixaban  (ELIQUIS ) 5 MG TABS tablet Take 1 tablet (5 mg total) by mouth 2 (two) times daily.   atorvastatin  (LIPITOR) 40 MG tablet Take 1 tablet (40 mg total) by mouth daily.   Blood Glucose Monitoring Suppl (TRUE METRIX METER) w/Device KIT Use to check blood sugar 4 times a day   busPIRone  (BUSPAR ) 5 MG tablet Take 1 tablet (5 mg total) by mouth 2 (two) times daily.   cholecalciferol  (VITAMIN D3) 25 MCG (1000 UNIT) tablet Take 1,000 Units by  mouth daily.   Continuous Glucose Receiver (FREESTYLE LIBRE 2 READER) DEVI To check blood sugars 3-4 times daily due to insulin  use. DX E11.40   Continuous Glucose Sensor (FREESTYLE LIBRE 2 SENSOR) MISC USE 1 SENSOR TO CHECK BLOOD SUGARS 3-4 TIMES DAILY DUE TO INSULIN  USE   Cyanocobalamin  1000 MCG/ML KIT Inject 1,000 mcg as directed every 30 (thirty) days. Next injection 3/21   cyclobenzaprine  (FLEXERIL ) 5 MG tablet Take 5 mg by mouth daily as needed for muscle spasms.   dapagliflozin  propanediol (FARXIGA ) 10 MG TABS tablet Take 1 tablet (10 mg total) by mouth daily before breakfast.   docusate sodium  (COLACE) 100 MG capsule Take 1 capsule (100 mg total) by mouth 2 (two) times daily as needed for mild constipation.   Dulaglutide  (TRULICITY ) 4.5  MG/0.5ML SOAJ Inject 4.5 mg into the skin once a week.   ENTRESTO  24-26 MG Take 1 tablet by mouth 2 (two) times daily.   folic acid  (FOLVITE ) 1 MG tablet TAKE 1 TABLET EVERY DAY   furosemide  (LASIX ) 40 MG tablet Take 0.5 tablets (20 mg total) by mouth daily.   gabapentin  (NEURONTIN ) 400 MG capsule Take 1 capsule (400 mg total) by mouth 3 (three) times daily.   insulin  detemir (LEVEMIR ) 100 UNIT/ML injection 30 Units.   insulin  lispro (HUMALOG  KWIKPEN) 100 UNIT/ML KwikPen Inject 5 Units into the skin 3 (three) times daily before meals. Do not inject insulin  if you are not going to eat meal.  Only take before a meal.   metFORMIN  (GLUCOPHAGE ) 1000 MG tablet Take 1 tablet (1,000 mg total) by mouth 2 (two) times daily with a meal.   metoprolol  succinate (TOPROL -XL) 50 MG 24 hr tablet Take 1 tablet (50 mg total) by mouth daily. Take with or immediately following a meal.   montelukast  (SINGULAIR ) 10 MG tablet Take 1 tablet (10 mg total) by mouth at bedtime.   naloxone  (NARCAN ) nasal spray 4 mg/0.1 mL Place 1 spray into the nose as needed for up to 365 doses (for opioid-induced respiratory depresssion). In case of emergency (overdose), spray once into each nostril.  If no response within 3 minutes, repeat application and call 911.   Nebulizers (EASY AIR COMPRESSOR NEBULIZER) MISC Use to inhaler nebulizer treatments at needed per instructions on nebulizer prescription   OXYGEN  Inhale 2 L into the lungs at bedtime.   PAIN MANAGEMENT INTRATHECAL, IT, PUMP 1 each by Intrathecal route. Intrathecal (IT) medication:  Morphine  5.0 mg/ml, Bupivacaine  20.0 mg/ml,  Clonidine  100.0 mcg/ml Patient does not remember current. Adjusted every 3 months at [facility]   pantoprazole  (PROTONIX ) 40 MG tablet Take 1 tablet (40 mg total) by mouth daily.   TRELEGY ELLIPTA  100-62.5-25 MCG/ACT AEPB INHALE 1 PUFF INTO THE LUNGS DAILY . 42 DAY EXPIRATION AFTER OPENING FOIL TRAY   TRESIBA  FLEXTOUCH 100 UNIT/ML FlexTouch Pen Inject 55 Units into the skin at bedtime.   venlafaxine  XR (EFFEXOR -XR) 150 MG 24 hr capsule Take 1 capsule (150 mg total) by mouth daily.   Current Facility-Administered Medications for the 04/07/24 encounter (Office Visit) with Abigail Bernardino HERO, PA-C  Medication   cyanocobalamin  (VITAMIN B12) injection 1,000 mcg    Allergies:   Other, Pain patch [menthol], Jardiance  [empagliflozin ], Prednisone , Avelox  [moxifloxacin  hcl in nacl], Doxycycline , Erythromycin , Fentanyl , Moxifloxacin  hcl, Oxycontin [oxycodone], and Ozempic [semaglutide]   Social History   Socioeconomic History   Marital status: Married    Spouse name: Darina   Number of children: 2   Years of education: Not on file   Highest education level: High school graduate  Occupational History   Occupation: retired  Tobacco Use   Smoking status: Former    Current packs/day: 0.00    Average packs/day: 1 pack/day for 30.0 years (30.0 ttl pk-yrs)    Types: Cigarettes    Start date: 04/30/1974    Quit date: 04/30/2004    Years since quitting: 19.9    Passive exposure: Past   Smokeless tobacco: Never  Vaping Use   Vaping status: Never Used  Substance and Sexual Activity   Alcohol  use: No   Drug  use: No   Sexual activity: Not Currently    Birth control/protection: Post-menopausal  Other Topics Concern   Not on file  Social History Narrative   ** Merged History Encounter ** **  Merged History Encounter **       Lives in home with mentally handicap son   Social Drivers of Corporate investment banker Strain: Low Risk  (01/29/2024)   Received from [facility]   Overall Financial Resource Strain (CARDIA)    Difficulty of Paying Living Expenses: Not hard at all  Food Insecurity: No Food Insecurity (01/29/2024)   Received from [facility]   Hunger Vital Sign    Within the past 12 months, you worried that your food would run out before you got the money to buy more.: Never true    Within the past 12 months, the food you bought just didn't last and you didn't have money to get more.: Never true  Transportation Needs: No Transportation Needs (01/29/2024)   Received from [facility] - Transportation    In the past 12 months, has lack of transportation kept you from medical appointments or from getting medications?: No    Lack of Transportation (Non-Medical): No  Physical Activity: Inactive (12/29/2023)   Exercise Vital Sign    Days of Exercise per Week: 0 days    Minutes of Exercise per Session: 0 min  Stress: No Stress Concern Present (12/29/2023)   [employer] of Occupational Health - Occupational Stress Questionnaire    Feeling of Stress : Only a little  Social Connections: Moderately Isolated (12/29/2023)   Social Connection and Isolation Panel    Frequency of Communication with Friends and Family: More than three times a week    Frequency of Social Gatherings with Friends and Family: More than three times a week    Attends Religious Services: More than 4 times per year    Active Member of [employer] or Organizations: No    Attends Banker Meetings: Never    Marital Status: Separated     Family  History:  The patient's family history includes Arthritis in her paternal grandfather; Cancer in her paternal grandmother and sister; Cancer (age of onset: 61) in her father; Cancer (age of onset: 37) in her mother; Hyperlipidemia in her sister and son; Hypertension in her sister; Osteoporosis in her maternal grandmother; Seizures in her son; Thyroid  disease in her mother. There is no history of Breast cancer.  ROS:   12-point review of systems is negative unless otherwise noted in the HPI.   EKGs/Labs/Other Studies Reviewed:    Studies reviewed were summarized above. The additional studies were reviewed today:  2D echo 08/11/2023: 1. Left ventricular ejection fraction, by estimation, is 25 to 30%. The  left ventricle has severely decreased function. The left ventricle  demonstrates global hypokinesis. Left ventricular diastolic parameters are  consistent with Grade I diastolic  dysfunction (impaired relaxation).   2. Right ventricular systolic function is low normal. The right  ventricular size is normal. Tricuspid regurgitation signal is inadequate  for assessing PA pressure.   3. The mitral valve is normal in structure. Trivial mitral valve  regurgitation. No evidence of mitral stenosis.   4. The aortic valve was not well visualized. Aortic valve regurgitation  is not visualized. No aortic stenosis is present.  __________  2D echo 10/22/2022: 1. Left ventricular ejection fraction, by estimation, is 45 to 50%. The  left ventricle has mildly decreased function. The left ventricle  demonstrates global hypokinesis. Left ventricular diastolic parameters are  indeterminate.   2. Right ventricular systolic function is normal. The right ventricular  size is normal.   3.  The mitral valve is normal in structure. No evidence of mitral valve  regurgitation.   4. The aortic valve is tricuspid. Aortic valve regurgitation is not  visualized.   5. The inferior vena cava is normal in size with  greater than 50%  respiratory variability, suggesting right atrial pressure of 3 mmHg.   Comparison(s): 09/11/21 EF w/ Def 25-30%.  __________  Lexiscan  MPI 11/14/2021: Pharmacological myocardial perfusion imaging study with no significant ischemia Small region apical thinning likely secondary to attenuation artifact Normal wall motion, EF estimated at 67% No EKG changes concerning for ischemia at peak stress or in recovery. CT attenuation correction images unavailable Low risk scan __________  Limited echo 10/31/2021: 1. Left ventricular ejection fraction, by estimation, is 40 to 45%. The  left ventricle has mildly decreased function. The left ventricle has no  regional wall motion abnormalities. Left ventricular diastolic parameters  are consistent with Grade I  diastolic dysfunction (impaired relaxation).   2. Right ventricular systolic function is normal. The right ventricular  size is normal. Tricuspid regurgitation signal is inadequate for assessing  PA pressure.   3. The mitral valve is normal in structure. No evidence of mitral valve  regurgitation. No evidence of mitral stenosis.   4. The aortic valve is normal in structure. Aortic valve regurgitation is  not visualized. No aortic stenosis is present.   5. The inferior vena cava is normal in size with greater than 50%  respiratory variability, suggesting right atrial pressure of 3 mmHg.  __________  2D echo 09/11/2021: 1. Left ventricular ejection fraction, by estimation, is 25 to 30%. The  left ventricle has severely decreased function. The left ventricle  demonstrates global hypokinesis. Left ventricular diastolic parameters are  consistent with Grade II diastolic  dysfunction (pseudonormalization).   2. Right ventricular systolic function is moderately reduced. The right  ventricular size is not well visualized.   3. The mitral valve is normal in structure. No evidence of mitral valve  regurgitation.   4. The aortic  valve is grossly normal. Aortic valve regurgitation is not  visualized.   5. The inferior vena cava is normal in size with <50% respiratory  variability, suggesting right atrial pressure of 8 mmHg. __________   2D echo 12/11/2020: 1. Left ventricular ejection fraction, by estimation, is 45%. The left  ventricle has mildly decreased function. The left ventricle demonstrates  global hypokinesis. Left ventricular diastolic parameters are consistent  with Grade II diastolic dysfunction  (pseudonormalization).   2. Right ventricular systolic function is normal. The right ventricular  size is normal.   3. The mitral valve is normal in structure. No evidence of mitral valve  regurgitation.   4. The aortic valve is tricuspid. Aortic valve regurgitation is not  visualized.   Comparison(s): EF 45-50%. __________   LHC 04/28/2017: Conclusions: No angiographically significant coronary artery disease. Severely reduced LV contraction (LVEF 25-30%) with mid and apical akinesis, consistent with Takotsubo (stress-induced) cardiomyopathy. Mildly elevated left ventricular filling pressure.   Recommendations: Medical therapy, including initiation of metoprolol  succinate 12.5 mg daily and furosemide  20 mg IV daily. If blood pressure tolerates, consider addition of low-dose ACE inhibitor tomorrow. Continue treatment of pain and opioid withdrawal.   EKG:  EKG is not ordered today.    Recent Labs: 08/09/2023: B Natriuretic Peptide 241.9 08/19/2023: Magnesium  2.0 12/18/2023: TSH 2.010 03/01/2024: ALT 13; Hemoglobin 11.8; Platelets 439 04/01/2024: BUN 24; Creatinine, Ser 1.38; Potassium 4.9; Sodium 138  Recent Lipid Panel    Component Value Date/Time  CHOL 132 03/21/2024 0933   CHOL 127 07/26/2019 0817   TRIG 82 03/21/2024 0933   TRIG 104 07/26/2019 0817   HDL 62 03/21/2024 0933   VLDL 21 07/26/2019 0817   LDLCALC 54 03/21/2024 0933    PHYSICAL EXAM:    VS:  BP (!) 100/59 (BP Location: Left  Wrist, Patient Position: Sitting, Cuff Size: Normal)   Pulse 89   Ht 4' 10 (1.473 m)   Wt 141 lb 6.4 oz (64.1 kg)   LMP  (LMP Unknown)   SpO2 100%   BMI 29.55 kg/m   BMI: Body mass index is 29.55 kg/m.  Physical Exam Vitals reviewed.  Constitutional:      Appearance: She is well-developed.  HENT:     Head: Normocephalic and atraumatic.  Eyes:     General:        Right eye: No discharge.        Left eye: No discharge.  Cardiovascular:     Rate and Rhythm: Normal rate and regular rhythm.     Heart sounds: Normal heart sounds, S1 normal and S2 normal. Heart sounds not distant. No midsystolic click and no opening snap. No murmur heard.    No friction rub.  Pulmonary:     Effort: Pulmonary effort is normal. No respiratory distress.     Breath sounds: Normal breath sounds. No wheezing, rhonchi or rales.     Comments: Mildly diminished breath sounds throughout.  On supplemental oxygen  via nasal cannula at 2 L. Musculoskeletal:     Cervical back: Normal range of motion.     Right lower leg: No edema.     Left lower leg: No edema.  Skin:    General: Skin is warm and dry.     Nails: There is no clubbing.  Neurological:     Mental Status: She is alert and oriented to person, place, and time.  Psychiatric:        Speech: Speech normal.        Behavior: Behavior normal.        Thought Content: Thought content normal.        Judgment: Judgment normal.     Wt Readings from Last 3 Encounters:  04/07/24 141 lb 6.4 oz (64.1 kg)  04/01/24 142 lb 12.8 oz (64.8 kg)  03/21/24 142 lb 3.2 oz (64.5 kg)     ASSESSMENT & PLAN:   HFrEF secondary to NICM: Euvolemic and well compensated with NYHA class difficult to assess secondary to multiple comorbidities.  Previously, EF had improved to 40 to 45% by echo with most recent echo showing a recurrence of cardiomyopathy with an EF of 20 to 25%, in the setting of acute illness and PE.  Has been maintained on GDMT including Toprol -XL 50 mg daily,  Farxiga  10 mg, furosemide  20 mg daily, and Entresto  24/26 mg twice daily.  Repeat echo to evaluate for improvement in cardiomyopathy.  Should cardiomyopathy persist would recommend further ischemic evaluation and further escalation of GDMT as tolerated.  Nonobstructive CAD: No symptoms suggestive of angina or cardiac decompensation.  Continue aggressive risk factor modification and primary prevention including apixaban  in lieu of aspirin  as well as atorvastatin  40 mg.  Persistent A-fib: Regular rate and rhythm on physical exam today, remains on Toprol -XL 50 mg.  CHA2DS2-VASc at least 6 (CHF, HTN, age x 1, DM, vascular disease, sex category).  Remains on apixaban  5 mg twice daily.  No falls or symptoms concerning for bleeding.  Recurrent pulmonary embolism: She  was initially diagnosed with PE in 2021 and treated with apixaban .  She was diagnosed with A-fib in 09/2021 and has been maintained on apixaban  since.  She reports adherence to apixaban , taking this twice daily every day and denies missing any doses, particularly leading up to her hospitalization in 08/2023, at which time she was diagnosed with multiple subsegmental PE, occurring while on anticoagulation.  Refer the patient to hematology for hypercoagulable workup and to determine if her anticoagulation needs to be transitioned to alternative pharmacotherapy.  HTN: Blood pressure is well-controlled in the office today.  Continue pharmacotherapy as outlined above.  HLD: LDL 54 in 03/2024 with normal AST/ALT in 02/2024.  Remains on atorvastatin  40 mg.  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  Followed by pulmonology.      Disposition: F/u with Dr. Mady or an APP in 2-3 months.   Medication Adjustments/Labs and Tests Ordered: Current medicines are reviewed at length with the patient today.  Concerns regarding medicines are outlined above. Medication changes, Labs and Tests  ordered today are summarized above and listed in the Patient Instructions accessible in Encounters.   Signed, Bernardino Bring, PA-C 04/07/2024 12:41 PM     [facility] [place] [address] [place], [place] [address] [phone]

## 2024-04-07 ENCOUNTER — Ambulatory Visit: Attending: Physician Assistant | Admitting: Physician Assistant

## 2024-04-07 ENCOUNTER — Encounter: Payer: Self-pay | Admitting: Physician Assistant

## 2024-04-07 VITALS — BP 100/59 | HR 89 | Ht <= 58 in | Wt 141.4 lb

## 2024-04-07 DIAGNOSIS — I2699 Other pulmonary embolism without acute cor pulmonale: Secondary | ICD-10-CM | POA: Diagnosis not present

## 2024-04-07 DIAGNOSIS — E785 Hyperlipidemia, unspecified: Secondary | ICD-10-CM | POA: Diagnosis not present

## 2024-04-07 DIAGNOSIS — I5020 Unspecified systolic (congestive) heart failure: Secondary | ICD-10-CM

## 2024-04-07 DIAGNOSIS — I4819 Other persistent atrial fibrillation: Secondary | ICD-10-CM | POA: Diagnosis not present

## 2024-04-07 DIAGNOSIS — I10 Essential (primary) hypertension: Secondary | ICD-10-CM | POA: Diagnosis not present

## 2024-04-07 DIAGNOSIS — J449 Chronic obstructive pulmonary disease, unspecified: Secondary | ICD-10-CM

## 2024-04-07 DIAGNOSIS — I428 Other cardiomyopathies: Secondary | ICD-10-CM | POA: Diagnosis not present

## 2024-04-07 DIAGNOSIS — I2510 Atherosclerotic heart disease of native coronary artery without angina pectoris: Secondary | ICD-10-CM | POA: Diagnosis not present

## 2024-04-07 DIAGNOSIS — J9611 Chronic respiratory failure with hypoxia: Secondary | ICD-10-CM | POA: Diagnosis not present

## 2024-04-07 NOTE — Patient Instructions (Signed)
 Medication Instructions:  Your physician recommends that you continue on your current medications as directed. Please refer to the Current Medication list given to you today.   *If you need a refill on your cardiac medications before your next appointment, please call your pharmacy*  Lab Work: None ordered at this time  If you have labs (blood work) drawn today and your tests are completely normal, you will receive your results only by: MyChart Message (if you have MyChart) OR A paper copy in the mail If you have any lab test that is abnormal or we need to change your treatment, we will call you to review the results.  Testing/Procedures: Your physician has requested that you have an echocardiogram. Echocardiography is a painless test that uses sound waves to create images of your heart. It provides your doctor with information about the size and shape of your heart and how well your heart's chambers and valves are working.   You may receive an ultrasound enhancing agent through an IV if needed to better visualize your heart during the echo. This procedure takes approximately one hour.  There are no restrictions for this procedure.  This will take place at [address] [facility] [address], [place] [address]  Please note: We ask at that you not bring children with you during ultrasound (echo/ vascular) testing. Due to room size and safety concerns, children are not allowed in the ultrasound rooms during exams. Our front office staff cannot provide observation of children in our lobby area while testing is being conducted. An adult accompanying a patient to their appointment will only be allowed in the ultrasound room at the discretion of the ultrasound technician under special circumstances. We apologize for any inconvenience.   Follow-Up: At [facility], you and your health needs are our priority.  As part of our continuing mission to provide you with exceptional  heart care, our providers are all part of one team.  This team includes your primary Cardiologist (physician) and Advanced Practice Providers or APPs (Physician Assistants and Nurse Practitioners) who all work together to provide you with the care you need, when you need it.  Your next appointment:   3 month(s)  Provider:   You may see Lonni Hanson, MD or Bernardino Bring, PA-C  We recommend signing up for the patient portal called MyChart.  Sign up information is provided on this After Visit Summary.  MyChart is used to connect with patients for Virtual Visits (Telemedicine).  Patients are able to view lab/test results, encounter notes, upcoming appointments, etc.  Non-urgent messages can be sent to your provider as well.   To learn more about what you can do with MyChart, go to [email or web address].

## 2024-04-11 ENCOUNTER — Ambulatory Visit

## 2024-04-11 DIAGNOSIS — M5459 Other low back pain: Secondary | ICD-10-CM | POA: Insufficient documentation

## 2024-04-11 DIAGNOSIS — Z79899 Other long term (current) drug therapy: Secondary | ICD-10-CM | POA: Insufficient documentation

## 2024-04-11 DIAGNOSIS — M545 Low back pain, unspecified: Secondary | ICD-10-CM | POA: Insufficient documentation

## 2024-04-11 NOTE — Progress Notes (Unsigned)
 PROVIDER NOTE: Interpretation of information contained herein should be left to medically-trained personnel. Specific patient instructions are provided elsewhere under Patient Instructions section of medical record. This document was created in part using STT-dictation technology, any transcriptional errors that may result from this process are unintentional.  Patient: Jordan Chan Type: Established DOB: [birth year] MRN: [id number] PCP: [name], NP  Service: Procedure DOS: 04/12/2024 Setting: Ambulatory Location: Ambulatory outpatient facility Delivery: Face-to-face Provider: Eric DELENA Como, MD Specialty: Interventional Pain Management Specialty designation: 09 Location: Outpatient facility Ref. Prov.: [name], Jolene T, NP       Interventional Therapy   Primary Reason for Visit: Interventional Pain Management Treatment. CC: No chief complaint on file.  Procedure:          Type: Management of Intrathecal Drug Delivery System (IDDS) - Reservoir Refill (04009). No rate change.  Indications: 1. Chronic pain syndrome   2. Chronic low back pain (1ry area of Pain) (Bilateral) (R>L) w/o sciatica   3. DDD (degenerative disc disease), lumbosacral w/ LBP & LEP   4. Low back pain of over 3 months duration   5. Intractable low back pain   6. Multifactorial low back pain   7. Chronic lower extremity pain (2ry area of Pain) (Bilateral) (R>L)   8. Chronic radicular pain of lower extremity   9. Chronic neck pain (3ry area of Pain) (Bilateral) (L>R)   10. Diabetic peripheral neuropathy (HCC)   11. Pharmacologic therapy   12. Chronic use of opiate for therapeutic purpose   13. Presence of intrathecal pump   14. Encounter for adjustment and management of infusion pump    Pain Assessment: Self-Reported Pain Score:  /10             Reported level is compatible with observation.          IDDS (Intrathecal Drug Delivery System):  Reservoir  Battery:  Manufacturer: Medtronic   Model: Synchromed III Model No.: [id number]  Serial No.: [id number]  Delivery Route: Intrathecal  Type: Programmable  Volume Capacity (mL): 40 mL reservoir  Priming Volume: N/A  Calibration Constant: 111  MRI compatibility: Conditional  Implant Site: Abdominal (RLQ)  Laterality: Right   Catheter: Manufacturer: Medtronic Model:  InDura catheter Model No.: 8709 Lot No.: N/A  Implanted Length (cm): 77.5 cm  Catheter Volume (mL): 0.171 mL  Delivery Route: Intrathecal  Canal Access Site: T1-2  Tip Level: T7 Tip Location: Posterior Tip Laterality: Right  Implant Details:  Initial Implant Date: 05/27/2017 Last Replacement Date: 02/11/2024 Initial Implanter: Lonni Charleston, MD [phone]; [facility], [place], [place]. Last Implanter: Abhijay Morriss A. Como, MD [phone]; [facility], [place], [place]. Delivery Route: Intrathecal Site: Abdominal Laterality:  Right   Additional equipment: TYRX Antibacterial envelope (Minocycline/rifampin)(REF: [id number]  LOT: [id number]  ED: 2024/10/01)   Initial Content:  1ry Medication Class: Preservative-Free Opioid 1ry Medication (Concentration): PF-Morphine  (5.0 mg/mL) 2ry Medication Class: Preservative-Free Local Anesthetic 2ry Medication (Concentration): PF-Bupivacaine  (20.0 mg/mL) 3ry Medication Class: Preservative-Free Adjuvant 3ry Medication (Concentration): PF-Clonidine  (100.0 mcg/mL)  PTM parameters:  Mode: Off/Inactive Bolus Dose: N/A Duration: N/A (min) Max. No.: N/A (bolus/day). Lock-out time (interval): N/A (hrs) Max. Daily bolus dose: N/A (mg/day) Total max. Daily dose (basal + bolus): N/A (mg/day)  Programming:  Type: Simple continuous.  Medication, Concentration, Infusion Program, & Delivery Rate: For up-to-date details please see most recent scanned programming printout.   Changes:  Medication Change: None at this  point Rate Change: No  change in rate  Reported side-effects or adverse reactions: None reported  Effectiveness: Described as relatively effective, allowing for increase in activities of daily living (ADL) Clinically meaningful improvement in function (CMIF): Sustained CMIF goals met  Plan: Pump refill today   Pharmacotherapy Assessment  Analgesic: morphine  sulfate powder (for intrathecal pump use)  MME/day: 2.5 mg/day (Intrathecal)   Monitoring: [place] PMP: PDMP reviewed during this encounter.       Pharmacotherapy: No side-effects or adverse reactions reported. Compliance: No problems identified. Effectiveness: Clinically acceptable. Plan: Refer to POC.  UDS:  Summary  Date Value Ref Range Status  06/17/2023 Note  Final    Comment:    ==================================================================== Compliance Drug Analysis, Ur ==================================================================== Test                             Result       Flag       Units  Drug Present and Declared for Prescription Verification   Gabapentin                      PRESENT      EXPECTED   Venlafaxine                     PRESENT      EXPECTED   Desmethylvenlafaxine           PRESENT      EXPECTED    Desmethylvenlafaxine is an expected metabolite of venlafaxine .    Metoprolol                      PRESENT      EXPECTED  Drug Present not Declared for Prescription Verification   Morphine                        1930         UNEXPECTED ng/mg creat    Potential sources of morphine  include administration of codeine or    morphine , use of heroin, or ingestion of poppy seeds.  Drug Absent but Declared for Prescription Verification   Hydrocodone                     Not Detected UNEXPECTED ng/mg creat   Cyclobenzaprine                 Not Detected UNEXPECTED   Acetaminophen                   Not Detected UNEXPECTED    Acetaminophen , as indicated in the declared medication list, is not    always detected  even when used as directed.  ==================================================================== Test                      Result    Flag   Units      Ref Range   Creatinine              97               mg/dL      >=79 ==================================================================== Declared Medications:  The flagging and interpretation on this report are based on the  following declared medications.  Unexpected results may arise from  inaccuracies in the declared medications.   **Note: The testing scope of this panel includes these medications:   Cyclobenzaprine  (Flexeril )  Gabapentin   Hydrocodone  (Norco)  Metoprolol  (Toprol )  Venlafaxine  (Effexor )   **  Note: The testing scope of this panel does not include small to  moderate amounts of these reported medications:   Acetaminophen  (Norco)   **Note: The testing scope of this panel does not include the  following reported medications:   Albuterol   Apixaban  (Eliquis )  Atorvastatin  (Lipitor)  Buspirone  (Buspar )  Dulaglutide  (Trulicity )  Fluticasone  (Trelegy)  Folic Acid   Furosemide  (Lasix )  Insulin  (Tresiba )  Losartan  (Cozaar )  Metformin   Montelukast   Ondansetron  (Zofran )  Pantoprazole  (Protonix )  Umeclidinium (Trelegy)  Vilanterol (Trelegy)  Vitamin B12  Vitamin D3 ==================================================================== For clinical consultation, please call [phone]. ====================================================================    No results found for: CBDTHCR, D8THCCBX, D9THCCBX  H&P (Pre-op Assessment):  Jordan Chan is a 73 y.o. (year old), female patient, seen today for interventional treatment. She  has a past surgical history that includes Abdominal hysterectomy (1987); Elbow arthroscopy with tendon reconstruction; morphine  pump (2011); Plantar fascia release; Esophagogastroduodenoscopy (egd) with propofol  (N/A, 01/01/2017); Colonoscopy with propofol  (N/A, 01/01/2017); LEFT  HEART CATH AND CORONARY ANGIOGRAPHY (N/A, 04/28/2017); Mastectomy (Right, 06/1997); Back surgery; Breast surgery (Right); Cardiac catheterization; Trigger finger release; Cataract extraction w/PHACO (Right, 05/19/2019); Intrathecal pump implant; Coccyx removal; Cataract extraction w/PHACO (Left, 06/16/2019); Colonoscopy with propofol  (N/A, 12/11/2021); Esophagogastroduodenoscopy (egd) with propofol  (N/A, 12/11/2021); Total hip arthroplasty (Left); and Pain pump revision (N/A, 02/11/2024). Jordan Chan has a current medication list which includes the following prescription(s): accu-chek aviva plus, accu-chek softclix lancets, albuterol , albuterol , apixaban , atorvastatin , true metrix meter, buspirone , cholecalciferol , freestyle libre 2 reader, freestyle libre 2 sensor, cyanocobalamin , cyclobenzaprine , dapagliflozin  propanediol, docusate sodium , trulicity , entresto , folic acid , furosemide , gabapentin , insulin  detemir, insulin  lispro, metformin , metoprolol  succinate, montelukast , naloxone , easy air compressor nebulizer, oxygen -helium, PAIN MANAGEMENT INTRATHECAL, IT, PUMP, pantoprazole , trelegy ellipta , tresiba  flextouch, and venlafaxine  xr, and the following Facility-Administered Medications: cyanocobalamin . Her primarily concern today is the No chief complaint on file.  Initial Vital Signs:  Pulse/HCG Rate:    Temp:   Resp:   BP:   SpO2:    BMI: Estimated body mass index is 29.55 kg/m as calculated from the following:   Height as of 04/07/24: 4' 10 (1.473 m).   Weight as of 04/07/24: 141 lb 6.4 oz (64.1 kg).  Risk Assessment: Allergies: Reviewed. She is allergic to other, pain patch [menthol], jardiance  [empagliflozin ], prednisone , avelox  [moxifloxacin  hcl in nacl], doxycycline , erythromycin , fentanyl , moxifloxacin  hcl, oxycontin [oxycodone], and ozempic [semaglutide].  Allergy Precautions: None required Coagulopathies: Reviewed. None identified.  Blood-thinner therapy: None at this time Active  Infection(s): Reviewed. None identified. Jordan Chan is afebrile  Site Confirmation: Jordan Chan was asked to confirm the procedure and laterality before marking the site Procedure checklist: Completed Consent: Before the procedure and under the influence of no sedative(s), amnesic(s), or anxiolytics, the patient was informed of the treatment options, risks and possible complications. To fulfill our ethical and legal obligations, as recommended by the American Medical Association's Code of Ethics, I have informed the patient of my clinical impression; the nature and purpose of the treatment or procedure; the risks, benefits, and possible complications of the intervention; the alternatives, including doing nothing; the risk(s) and benefit(s) of the alternative treatment(s) or procedure(s); and the risk(s) and benefit(s) of doing nothing.  Jordan Chan was provided with information about the general risks and possible complications associated with most interventional procedures. These include, but are not limited to: failure to achieve desired goals, infection, bleeding, organ or nerve damage, allergic reactions, paralysis, and/or death.  In addition, she was informed of those risks and possible complications associated to this particular  procedure, which include, but are not limited to: damage to the implant; failure to decrease pain; local, systemic, or serious CNS infections, intraspinal abscess with possible cord compression and paralysis, or life-threatening such as meningitis; bleeding; organ damage; nerve injury or damage with subsequent sensory, motor, and/or autonomic system dysfunction, resulting in transient or permanent pain, numbness, and/or weakness of one or several areas of the body; allergic reactions, either minor or major life-threatening, such as anaphylactic or anaphylactoid reactions.  Furthermore, Jordan Chan was informed of those risks and complications associated with the  medications. These include, but are not limited to: allergic reactions (i.e.: anaphylactic or anaphylactoid reactions); endorphine suppression; bradycardia and/or hypotension; water  retention and/or peripheral vascular relaxation leading to lower extremity edema and possible stasis ulcers; respiratory depression and/or shortness of breath; decreased metabolic rate leading to weight gain; swelling or edema; medication-induced neural toxicity; particulate matter embolism and blood vessel occlusion with resultant organ, and/or nervous system infarction; and/or intrathecal granuloma formation with possible spinal cord compression and permanent paralysis.  Before refilling the pump Jordan Chan was informed that some of the medications used in the devise may not be FDA approved for such use and therefore it constitutes an off-label use of the medications.  Finally, she was informed that Medicine is not an exact science; therefore, there is also the possibility of unforeseen or unpredictable risks and/or possible complications that may result in a catastrophic outcome. The patient indicated having understood very clearly. We have given the patient no guarantees and we have made no promises. Enough time was given to the patient to ask questions, all of which were answered to the patient's satisfaction. Jordan Chan has indicated that she wanted to continue with the procedure. Attestation: I, the ordering provider, attest that I have discussed with the patient the benefits, risks, side-effects, alternatives, likelihood of achieving goals, and potential problems during recovery for the procedure that I have provided informed consent. Date  Time: {CHL [facility]-PAIN TIME CHOICES:[id number]}  Pre-Procedure Preparation:  Monitoring: As per clinic protocol. Respiration, ETCO2, SpO2, BP, heart rate and rhythm monitor placed and checked for adequate function Safety Precautions: Patient was assessed for positional comfort and  pressure points before starting the procedure. Time-out: I initiated and conducted the Time-out before starting the procedure, as per protocol. The patient was asked to participate by confirming the accuracy of the Time Out information. Verification of the correct person, site, and procedure were performed and confirmed by me, the nursing staff, and the patient. Time-out conducted as per Joint Commission's Universal Protocol (UP.01.01.01). Time:   Start Time:   hrs.  Description of Procedure:          Position: Supine Target Area: Central-port of intrathecal pump. Approach: Anterior, 90 degree angle approach. Area Prepped: Entire Area around the pump implant. ChloraPrep (2% chlorhexidine  gluconate and 70% isopropyl alcohol ) Safety Precautions: Aspiration looking for blood return was conducted prior to all injections. At no point did we inject any substances, as a needle was being advanced. No attempts were made at seeking any paresthesias. Safe injection practices and needle disposal techniques used. Medications properly checked for expiration dates. SDV (single dose vial) medications used. Description of the Procedure: Protocol guidelines were followed. Two nurses trained to do implant refills were present during the entire procedure. The refill medication was checked by both healthcare providers as well as the patient. The patient was included in the Time-out to verify the medication. The patient was placed in position. The pump was identified. The area  was prepped in the usual manner. The sterile template was positioned over the pump, making sure the side-port location matched that of the pump. Both, the pump and the template were held for stability. The needle provided in the Medtronic Kit was then introduced thru the center of the template and into the central port. The pump content was aspirated and discarded volume documented. The new medication was slowly infused into the pump, thru the  filter, making sure to avoid overpressure of the device. The needle was then removed and the area cleansed, making sure to leave some of the prepping solution back to take advantage of its long term bactericidal properties. The pump was interrogated and programmed to reflect the correct medication, volume, and dosage. The program was printed and taken to the physician for approval. Once checked and signed by the physician, a copy was provided to the patient and another scanned into the EMR.  There were no vitals filed for this visit.  Start Time:   hrs. End Time:   hrs. Materials & Medications: Medtronic Refill Kit Medication(s): Please see chart orders for details.  Type of Imaging Technique: None used Indication(s): N/A Exposure Time: No patient exposure Contrast: None used. Fluoroscopic Guidance: N/A Ultrasound Guidance: N/A Interpretation: N/A  Antibiotic Prophylaxis:   Anti-infectives (From admission, onward)    None      Indication(s): None identified  Post-operative Assessment:  Post-procedure Vital Signs:  Pulse/HCG Rate:    Temp:   Resp:   BP:   SpO2:    EBL: None  Complications: No immediate post-treatment complications observed by team, or reported by patient.  Note: The patient tolerated the entire procedure well. A repeat set of vitals were taken after the procedure and the patient was kept under observation following institutional policy, for this type of procedure. Post-procedural neurological assessment was performed, showing return to baseline, prior to discharge. The patient was provided with post-procedure discharge instructions, including a section on how to identify potential problems. Should any problems arise concerning this procedure, the patient was given instructions to immediately contact us , at any time, without hesitation. In any case, we plan to contact the patient by telephone for a follow-up status report regarding this interventional  procedure.  Comments:  No additional relevant information.  Plan of Care (POC)  Orders:  No orders of the defined types were placed in this encounter.    Analgesic: morphine  sulfate powder (for intrathecal pump use)  MME/day: 2.5 mg/day (Intrathecal)    Medications ordered for procedure: No orders of the defined types were placed in this encounter.  Medications administered: Jordan Chan had no medications administered during this visit.  See the medical record for exact dosing, route, and time of administration.    Interventional Therapies  Risk Factors  Considerations  Medical Comorbidities:  Eliquis  Anticoagulation: (Stop: 3 days  Restart: 6 hours)  A-Fib  Hx of PE  CHF w/ Reduced EF  Cardiomyopathy  Stage 3a CKD  COPD on O2  GERD  T2IDDM  OSA     Planned  Pending:   Revision and replacement of intrathecal pump reservoir secondary to battery depletion. (Before July 2025)    Under consideration:   Revision and replacement of intrathecal pump reservoir secondary to battery depletion. (Before July 2025)  Intrathecal pump refill and management.   Completed:   Surgery: Intrathecal pump implant #1 (*) by Lonni Charleston, MD    Therapeutic  Palliative (PRN) options:   None established   Completed by  other providers:   Surgery: Intrathecal pump replacement (05/27/2017) by Nancey Lonni LABOR, MD ([facility])      Follow-up plan:   No follow-ups on file.     Recent Visits Date Type Provider Dept  02/22/24 Office Visit Tanya Glisson, MD [facility]  02/01/24 Office Visit Tanya Glisson, MD [facility]  Showing recent visits within past 90 days and meeting all other requirements Future Appointments Date Type Provider Dept  04/12/24 Appointment Tanya Glisson, MD [facility]  Showing future appointments within next 90 days and meeting all other requirements   Disposition: Discharge  home  Discharge (Date  Time): 04/12/2024;   hrs.   Primary Care Physician: [name], Jolene T, NP Location: [facility] Note by: Glisson LABOR Tanya, MD (TTS technology used. I apologize for any typographical errors that were not detected and corrected.) Date: 04/12/2024; Time: 6:50 AM  Disclaimer:  Medicine is not an Visual merchandiser. The only guarantee in medicine is that nothing is guaranteed. It is important to note that the decision to proceed with this intervention was based on the information collected from the patient. The Data and conclusions were drawn from the patient's questionnaire, the interview, and the physical examination. Because the information was provided in large part by the patient, it cannot be guaranteed that it has not been purposely or unconsciously manipulated. Every effort has been made to obtain as much relevant data as possible for this evaluation. It is important to note that the conclusions that lead to this procedure are derived in large part from the available data. Always take into account that the treatment will also be dependent on availability of resources and existing treatment guidelines, considered by other Pain Management Practitioners as being common knowledge and practice, at the time of the intervention. For Medico-Legal purposes, it is also important to point out that variation in procedural techniques and pharmacological choices are the acceptable norm. The indications, contraindications, technique, and results of the above procedure should only be interpreted and judged by a Board-Certified Interventional Pain Specialist with extensive familiarity and expertise in the same exact procedure and technique.

## 2024-04-12 ENCOUNTER — Encounter: Payer: Self-pay | Admitting: Pain Medicine

## 2024-04-12 ENCOUNTER — Ambulatory Visit (HOSPITAL_BASED_OUTPATIENT_CLINIC_OR_DEPARTMENT_OTHER): Admitting: Pain Medicine

## 2024-04-12 ENCOUNTER — Ambulatory Visit: Payer: Self-pay | Admitting: Family

## 2024-04-12 ENCOUNTER — Other Ambulatory Visit
Admission: RE | Admit: 2024-04-12 | Discharge: 2024-04-12 | Disposition: A | Discharge status: Home / Self Care | Attending: Family | Admitting: Family

## 2024-04-12 VITALS — BP 112/43 | HR 98 | Temp 98.2°F | Resp 16 | Ht <= 58 in | Wt 142.0 lb

## 2024-04-12 DIAGNOSIS — Z79899 Other long term (current) drug therapy: Secondary | ICD-10-CM

## 2024-04-12 DIAGNOSIS — G894 Chronic pain syndrome: Secondary | ICD-10-CM

## 2024-04-12 DIAGNOSIS — I5022 Chronic systolic (congestive) heart failure: Secondary | ICD-10-CM | POA: Insufficient documentation

## 2024-04-12 DIAGNOSIS — E1142 Type 2 diabetes mellitus with diabetic polyneuropathy: Secondary | ICD-10-CM

## 2024-04-12 DIAGNOSIS — Z978 Presence of other specified devices: Secondary | ICD-10-CM

## 2024-04-12 DIAGNOSIS — G8929 Other chronic pain: Secondary | ICD-10-CM

## 2024-04-12 DIAGNOSIS — M5459 Other low back pain: Secondary | ICD-10-CM

## 2024-04-12 DIAGNOSIS — Z79891 Long term (current) use of opiate analgesic: Secondary | ICD-10-CM

## 2024-04-12 DIAGNOSIS — M545 Low back pain, unspecified: Secondary | ICD-10-CM

## 2024-04-12 DIAGNOSIS — Z451 Encounter for adjustment and management of infusion pump: Secondary | ICD-10-CM

## 2024-04-12 DIAGNOSIS — M51372 Other intervertebral disc degeneration, lumbosacral region with discogenic back pain and lower extremity pain: Secondary | ICD-10-CM

## 2024-04-12 LAB — BASIC METABOLIC PANEL WITH GFR
Anion gap: 11 (ref 5–15)
BUN: 19 mg/dL (ref 8–23)
CO2: 28 mmol/L (ref 22–32)
Calcium: 9.1 mg/dL (ref 8.9–10.3)
Chloride: 96 mmol/L — ABNORMAL LOW (ref 98–111)
Creatinine, Ser: 1.17 mg/dL — ABNORMAL HIGH (ref 0.44–1.00)
GFR, Estimated: 49 mL/min — ABNORMAL LOW (ref >60)
Glucose, Bld: 185 mg/dL — ABNORMAL HIGH (ref 70–99)
Potassium: 4.4 mmol/L (ref 3.5–5.1)
Sodium: 135 mmol/L (ref 135–145)

## 2024-04-12 MED FILL — Medication: INTRATHECAL | Qty: 1 | Status: AC

## 2024-04-12 NOTE — Patient Instructions (Signed)
 Opioid Overdose Opioids are drugs that are often used to treat pain. Opioids include illegal drugs, such as heroin, as well as prescription pain medicines, such as codeine, morphine, hydrocodone, and fentanyl. An opioid overdose happens when you take too much of an opioid. An overdose may be intentional or accidental and can happen with any type of opioid. The effects of an overdose can be mild, dangerous, or even deadly. Opioid overdose is a medical emergency. What are the causes? This condition may be caused by: Taking too much of an opioid on purpose. Taking too much of an opioid by accident. Using two or more substances that contain opioids at the same time. Taking an opioid with a substance that affects your heart, breathing, or blood pressure. These include alcohol, tranquilizers, sleeping pills, illegal drugs, and some over-the-counter medicines. This condition may also happen due to an error made by: A health care provider who prescribes a medicine. The pharmacist who fills the prescription. What increases the risk? This condition is more likely in: Children. They may be attracted to colorful pills. Because of a child's small size, even a small amount of a medicine can be dangerous. Older people. They may be taking many different medicines. Older people may have difficulty reading labels or remembering when they last took their medicines. They may also be more sensitive to the effects of opioids. People with chronic medical conditions, especially heart, liver, kidney, or neurological diseases. People who take an opioid for a long period of time. People who take opioids and use illegal drugs, such as heroin, or other substances, such as alcohol. People who: Have a history of drug or alcohol abuse. Have certain mental health conditions. Have a history of previous drug overdoses. People who take opioids that are not prescribed for them. What are the signs or symptoms? Symptoms of this  condition depend on the type of opioid and the amount that was taken. Common symptoms include: Sleepiness or difficulty waking from sleep. Confusion. Slurred speech. Slowed breathing and a slow pulse (bradycardia). Nausea and vomiting. Abnormally small pupils. Signs and symptoms that require emergency treatment include: Cold, clammy, and pale skin. Blue lips and fingernails. Vomiting. Gurgling sounds in the throat. A pulse that is very slow or difficult to detect. Breathing that is very irregular, slow, noisy, or difficult to detect. Inability to respond to speech or be awakened from sleep (stupor). Seizures. How is this diagnosed? This condition is diagnosed based on your symptoms and medical history. It is important to tell your health care provider: About all of the opioids that you took. When you took the opioids. Whether you were drinking alcohol or using marijuana, cocaine, or other drugs. Your health care provider will do a physical exam. This exam may include: Checking and monitoring your heart rate and rhythm, breathing rate, temperature, and blood pressure. Measuring oxygen levels in your blood. Checking for abnormally small pupils. You may also have blood tests or urine tests. You may have X-rays if you are having severe breathing problems. How is this treated? This condition requires immediate medical treatment and hospitalization. Reversing the effects of the opioid is the first step in treatment. If you have a Narcan kit or naloxone, use it right away. Follow your health care provider's instructions. A friend or family member can also help you with this. The rest of your treatment will be given in the hospital intensive care (ICU). Treatment in the hospital may include: Giving salts and minerals (electrolytes) along with fluids through  an IV. Inserting a breathing tube (endotracheal tube) in your airway to help you breathe if you cannot breathe on your own or you are in  danger of not being able to breathe on your own. Giving oxygen through a small tube under your nose. Passing a tube through your nose and into your stomach (nasogastric tube, or NG tube) to empty your stomach. Giving medicines that: Increase your blood pressure. Relieve nausea and vomiting. Relieve abdominal pain and cramping. Reverse the effects of the opioid (naloxone). Monitoring your heart and oxygen levels. Ongoing counseling and mental health support if you intentionally overdosed or used an illegal drug. Follow these instructions at home:  Medicines Take over-the-counter and prescription medicines only as told by your health care provider. Always ask your health care provider about possible side effects and interactions of any new medicine that you start taking. Keep a list of all the medicines that you take, including over-the-counter medicines. Bring this list with you to all your medical visits. General instructions Drink enough fluid to keep your urine pale yellow. Keep all follow-up visits. This is important. How is this prevented? Read the drug inserts that come with your opioid pain medicines. Take medicines only as told by your health care provider. Do not take more medicine than you are told. Do not take medicines more frequently than you are told. Do not drink alcohol or take sedatives when taking opioids. Do not use illegal or recreational drugs, including cocaine, ecstasy, and marijuana. Do not take opioid medicines that are not prescribed for you. Store all medicines in safety containers that are out of the reach of children. Get help if you are struggling with: Alcohol or drug use. Depression or another mental health problem. Thoughts of hurting yourself or another person. Keep the phone number of your local poison control center near your phone or in your mobile phone. In the [place] the hotline of the [facility] is 573 287 8891. If you were  prescribed naloxone, make sure you understand how to take it. Contact a health care provider if: You need help understanding how to take your pain medicines. You feel your medicines are too strong. You are concerned that your pain medicines are not working well for your pain. You develop new symptoms or side effects when you are taking medicines. Get help right away if: You or someone else is having symptoms of an opioid overdose. Get help even if you are not sure. You have thoughts about hurting yourself or others. You have: Chest pain. Difficulty breathing. A loss of consciousness. These symptoms may represent a serious problem that is an emergency. Do not wait to see if the symptoms will go away. Get medical help right away. Call your local emergency services (911 in the [place] Do not drive yourself to the hospital. If you ever feel like you may hurt yourself or others, or have thoughts about taking your own life, get help right away. You can go to your nearest emergency department or: Call your local emergency services (911 in the [place] Call a suicide crisis helpline, such as the National Suicide Prevention Lifeline at 410-283-8438 or 988 in the [place] This is open 24 hours a day in the [place] If you're a Veteran: Call 988 and press 1. This is open 24 hours a day. Text the [employer] at 765 788 0138. Summary Opioids are drugs that are often used to treat pain. Opioids include illegal drugs, such as heroin, as well as prescription  pain medicines. An opioid overdose happens when you take too much of an opioid. Overdoses can be intentional or accidental. Opioid overdose is very dangerous. It is a life-threatening emergency. If you or someone you know is experiencing an opioid overdose, get help right away. This information is not intended to replace advice given to you by your health care provider. Make sure you discuss any questions you have with your health care provider. Document  Revised: 04/27/2023 Document Reviewed: 10/31/2020 Elsevier Patient Education  2024 [employer].

## 2024-04-13 ENCOUNTER — Telehealth: Payer: Self-pay

## 2024-04-13 NOTE — Telephone Encounter (Signed)
 Post procedure follow up. Patient states she is doing fine.

## 2024-04-17 ENCOUNTER — Other Ambulatory Visit: Payer: Self-pay | Admitting: Nurse Practitioner

## 2024-04-18 ENCOUNTER — Encounter: Payer: Self-pay | Admitting: Oncology

## 2024-04-19 NOTE — Telephone Encounter (Signed)
 Requested Prescriptions  Pending Prescriptions Disp Refills   Continuous Glucose Sensor (FREESTYLE LIBRE 2 SENSOR) MISC [Pharmacy Med Name: FREESTYLE LIBRE 2 SEN 14D KIT] 2 each 0    Sig: USE 1 SENSOR TO CHECK BLOOD SUGARS 3-4 TIMES DAILY DUE TO INSULIN  USE     Endocrinology: Diabetes - Testing Supplies Passed - 04/19/2024  9:35 AM      Passed - Valid encounter within last 12 months    Recent Outpatient Visits           4 weeks ago Insulin  dependent type 2 diabetes mellitus (HCC)   [facility] [place], Brandon T, NP   4 months ago Insulin  dependent type 2 diabetes mellitus (HCC)   [facility] [place], [place] T, NP   5 months ago Torticollis   [place] [facility] [place], Allen T, NP   7 months ago Insulin  dependent type 2 diabetes mellitus (HCC)   [facility] [facility] [name], NP       Future Appointments             In 2 months Dunn, Bernardino HERO, PA-C [facility]

## 2024-04-20 ENCOUNTER — Telehealth: Payer: Self-pay

## 2024-04-20 NOTE — Telephone Encounter (Unsigned)
 Copied from CRM #[id number]. Topic: Appointments - Scheduling Inquiry for Clinic >> Apr 20, 2024  9:16 AM Jasmin G wrote: Reason for CRM: Pt called to request getting a b12 shot at her appt on Sep 19th at 9:00 a.m, call pt back at [phone] to let her know if this is possible.

## 2024-04-20 NOTE — Telephone Encounter (Signed)
 Forwarding to provider. I don't see that she has had any recent ones so want to ensure she is to still receive.

## 2024-04-22 ENCOUNTER — Ambulatory Visit

## 2024-04-22 DIAGNOSIS — E538 Deficiency of other specified B group vitamins: Secondary | ICD-10-CM | POA: Diagnosis not present

## 2024-04-22 DIAGNOSIS — Z23 Encounter for immunization: Secondary | ICD-10-CM | POA: Diagnosis not present

## 2024-04-22 NOTE — Progress Notes (Signed)
 Patient is in office today for a nurse visit for B12 Injection. Patient Injection was given in the  Right deltoid. Patient tolerated injection well.  Patient also received her high dose flu vaccine to the left deltiod. Patient tolerated well with no concerns.

## 2024-04-27 ENCOUNTER — Inpatient Hospital Stay: Attending: Nurse Practitioner | Admitting: Oncology

## 2024-04-27 ENCOUNTER — Encounter: Payer: Self-pay | Admitting: Oncology

## 2024-04-27 VITALS — BP 113/32 | HR 88 | Temp 96.3°F | Resp 18 | Ht <= 58 in | Wt 145.3 lb

## 2024-04-27 DIAGNOSIS — D72828 Other elevated white blood cell count: Secondary | ICD-10-CM | POA: Diagnosis not present

## 2024-04-27 DIAGNOSIS — D721 Eosinophilia, unspecified: Secondary | ICD-10-CM | POA: Diagnosis not present

## 2024-04-27 DIAGNOSIS — Z853 Personal history of malignant neoplasm of breast: Secondary | ICD-10-CM | POA: Insufficient documentation

## 2024-04-27 DIAGNOSIS — D472 Monoclonal gammopathy: Secondary | ICD-10-CM | POA: Diagnosis not present

## 2024-04-27 DIAGNOSIS — E611 Iron deficiency: Secondary | ICD-10-CM | POA: Insufficient documentation

## 2024-04-27 DIAGNOSIS — Z801 Family history of malignant neoplasm of trachea, bronchus and lung: Secondary | ICD-10-CM | POA: Diagnosis not present

## 2024-04-27 DIAGNOSIS — Z86711 Personal history of pulmonary embolism: Secondary | ICD-10-CM | POA: Insufficient documentation

## 2024-04-27 DIAGNOSIS — Z800 Family history of malignant neoplasm of digestive organs: Secondary | ICD-10-CM | POA: Insufficient documentation

## 2024-04-27 DIAGNOSIS — Z87891 Personal history of nicotine dependence: Secondary | ICD-10-CM | POA: Insufficient documentation

## 2024-04-27 DIAGNOSIS — Z7901 Long term (current) use of anticoagulants: Secondary | ICD-10-CM | POA: Diagnosis not present

## 2024-04-27 NOTE — Progress Notes (Signed)
 Patient states she's doing okay.

## 2024-04-27 NOTE — Progress Notes (Signed)
 Hematology/Oncology Consult note [facility]  [phone] [phone]  Patient Care Team: [name], Jolene T, NP as PCP - General (Nurse Practitioner) End, Lonni, MD as PCP - Cardiology (Cardiology) Theotis Lavelle BRAVO, MD as Referring Physician (Pulmonary Disease) Nyle Rankin POUR, RPH (Inactive) (Pharmacist) Karoline Lima, RN as [facility] Jaye Fallow, MD as Referring Physician (Ophthalmology) Tanya Glisson, MD as Referring Physician (Pain Medicine) Unk Corinn Skiff, MD as Consulting Physician (Gastroenterology) Donette Ellouise LABOR, FNP (Cardiology)   Name of the patient: Jordan Chan  [id number]  [birth year]   Date of visit: 04/27/24  Diagnosis- 1.  IgG kappa MGUS  2.  Neutrophilia likely reactive  Chief complaint/ Reason for visit-routine follow-up of MGUS  Heme/Onc history: patient is a 73 year old female with a past medical history significant for COPD GERD insulin -dependent diabetes history of breast cancer in the remote past who has been referred to us  for leukocytosis and thrombocytosis.Looking back at her CBC her white cell count fluctuates between 10-16 over the last 7 years.  More recently since the last 3 months her white count has been between 11-13.  There are times when her white count has gone up to 34 and patient thinks that she may have been on steroids.  Differential has mainly showed neutrophilia but also with some monocytosis and mild eosinophilia.  Patient also noted to have a platelet count mainly in the high 400s at least since 2015 with no clear rising trend.  Hemoglobin more recently has been close to 11   Results of blood work from 12/21/2020 were as follows: CBC showed white cell count of 13, H&H of 11.7/37.2 with an MCV of 89 and platelet count of 446.  Iron  studies did show evidence of iron  deficiency with elevated TIBC of 475, low iron  saturation of 8%And low ferritin of 5 patient  received 5 doses of Venofer    BCR ABL FISH testing, JAK2 mutation analysis, flow cytometry wasUnremarkable.    Interval history-patient has been on home oxygen  since January 2025 after an episode of aspiration pneumonia.  She was also found to have bilateral subsegmental pulmonary emboli at that time.  She has been on Eliquis  even prior to that and continues to be on Eliquis  and reports being compliant  ECOG PS- 2 Pain scale- 0   Review of systems- Review of Systems  Constitutional:  Positive for malaise/fatigue. Negative for chills, fever and weight loss.  HENT:  Negative for congestion, ear discharge and nosebleeds.   Eyes:  Negative for blurred vision.  Respiratory:  Positive for shortness of breath. Negative for cough, hemoptysis, sputum production and wheezing.   Cardiovascular:  Negative for chest pain, palpitations, orthopnea and claudication.  Gastrointestinal:  Negative for abdominal pain, blood in stool, constipation, diarrhea, heartburn, melena, nausea and vomiting.  Genitourinary:  Negative for dysuria, flank pain, frequency, hematuria and urgency.  Musculoskeletal:  Negative for back pain, joint pain and myalgias.  Skin:  Negative for rash.  Neurological:  Negative for dizziness, tingling, focal weakness, seizures, weakness and headaches.  Endo/Heme/Allergies:  Does not bruise/bleed easily.  Psychiatric/Behavioral:  Negative for depression and suicidal ideas. The patient does not have insomnia.       Allergies  Allergen Reactions   Other Palpitations    IV steroids   Pain Patch [Menthol] Anaphylaxis   Jardiance  [Empagliflozin ] Other (See Comments)    Yeast infection & dry mouth   Prednisone     Avelox  [Moxifloxacin  Hcl In Nacl] Other (See Comments)  Upset stomach   Doxycycline  Diarrhea   Erythromycin  Nausea Only and Other (See Comments)    Has used azithromycin  without documented ADRs   Fentanyl  Nausea Only and Rash   Moxifloxacin  Hcl Other (See Comments)     Upset stomach   Oxycontin [Oxycodone] Hives   Ozempic [Semaglutide] Nausea Only     Past Medical History:  Diagnosis Date   (HFpEF) heart failure with preserved ejection fraction (HCC)    Anemia    Anxiety    Aortic atherosclerosis    Arthritis    Asthma    Bilateral pulmonary embolism (small subsegmental) 08/11/2023   Breast cancer, right (HCC) 1998   a.) Tx'd with surgical excision + chemotherapy + XRT   Bronchitis    Cerebral microvascular disease    Chronic pain syndrome    CKD (chronic kidney disease), stage III (HCC)    COPD (chronic obstructive pulmonary disease) (HCC)    DDD (degenerative disc disease), cervical    Decreased ROM of intervertebral discs of cervical spine    Dependence on nocturnal oxygen  therapy (2L/Katherine)    Depression    Diabetes mellitus type 2, insulin  dependent (HCC)    Diverticulitis    Dyspnea    Endometriosis    GERD (gastroesophageal reflux disease)    History of shingles    Hypercholesteremia    Hypertension    IBS (irritable bowel syndrome)    Low back pain    a.) has intrathecal pump (morphine /bupivicaine/clonidine )   Neuropathy    On apixaban  therapy    Opioid withdrawal (HCC)    a.) experiences withdrawal symptoms when batteries in intrathecal pump reach EOL   Orthopnea    PAF (paroxysmal atrial fibrillation) (HCC)    a.) CHA2DS2VASc = 8 (age, sex, HFpEF, HTN, TIA/PE x 2, vascular disease, T2DM) as of metoprolol  succinate; b.) rate/rhythm maintained on oral metoprolol  succinate; chronically anticoagulated with apixaban    Personal history of chemotherapy    Personal history of radiation therapy    Pneumonia    Scoliosis    Sleep apnea    a.) no nocturnal PAP therapy; requires supplemental oxygen  at bedtime   Status post bilateral cataract extraction 2020   Takotsubo cardiomyopathy    TIA (transient ischemic attack) 2010     Past Surgical History:  Procedure Laterality Date   ABDOMINAL HYSTERECTOMY  1987   BACK SURGERY      Tailbone removed following fracture   BREAST SURGERY Right    mastectomy   CARDIAC CATHETERIZATION     CATARACT EXTRACTION W/PHACO Right 05/19/2019   Procedure: CATARACT EXTRACTION PHACO AND INTRAOCULAR LENS PLACEMENT (IOC), RIGHT, DIABETIC;  Surgeon: Ferol Rogue, MD;  Location: [facility] ORS;  Service: Ophthalmology;  Laterality: Right;  Lot # [id number] H US : 00:43.8 CDE: 4.59   CATARACT EXTRACTION W/PHACO Left 06/16/2019   Procedure: CATARACT EXTRACTION PHACO AND INTRAOCULAR LENS PLACEMENT (IOC) LEFT VISION BLUE DIABETIC;  Surgeon: Ferol Rogue, MD;  Location: [facility] ORS;  Service: Ophthalmology;  Laterality: Left;  Lot #[id number] H US : 00:46.9 CDE: 6.53   COCCYX REMOVAL     COLONOSCOPY WITH PROPOFOL  N/A 01/01/2017   Procedure: COLONOSCOPY WITH PROPOFOL ;  Surgeon: Therisa Bi, MD;  Location: [facility] ENDOSCOPY;  Service: Endoscopy;  Laterality: N/A;   COLONOSCOPY WITH PROPOFOL  N/A 12/11/2021   Procedure: COLONOSCOPY WITH PROPOFOL ;  Surgeon: Unk Corinn Skiff, MD;  Location: [facility] ENDOSCOPY;  Service: Gastroenterology;  Laterality: N/A;   ELBOW ARTHROSCOPY WITH TENDON RECONSTRUCTION     ESOPHAGOGASTRODUODENOSCOPY (EGD) WITH PROPOFOL  N/A 01/01/2017   Procedure:  ESOPHAGOGASTRODUODENOSCOPY (EGD) WITH PROPOFOL ;  Surgeon: Therisa Bi, MD;  Location: [facility] ENDOSCOPY;  Service: Endoscopy;  Laterality: N/A;   ESOPHAGOGASTRODUODENOSCOPY (EGD) WITH PROPOFOL  N/A 12/11/2021   Procedure: ESOPHAGOGASTRODUODENOSCOPY (EGD) WITH PROPOFOL ;  Surgeon: Unk Corinn Skiff, MD;  Location: [facility] ENDOSCOPY;  Service: Gastroenterology;  Laterality: N/A;   INTRATHECAL PUMP IMPLANT     LEFT HEART CATH AND CORONARY ANGIOGRAPHY N/A 04/28/2017   Procedure: LEFT HEART CATH AND CORONARY ANGIOGRAPHY;  Surgeon: Mady Bruckner, MD;  Location: [facility] INVASIVE CV LAB;  Service: Cardiovascular;  Laterality: N/A;   MASTECTOMY Right 06/1997   morphine  pump  2011   PAIN PUMP REVISION N/A 02/11/2024   Procedure: PAIN PUMP REPLACEMENT;  Surgeon:  Tanya Glisson, MD;  Location: [facility] ORS;  Service: Neurosurgery;  Laterality: N/A;   PLANTAR FASCIA RELEASE     TOTAL HIP ARTHROPLASTY Left    TRIGGER FINGER RELEASE      Social History   Socioeconomic History   Marital status: Married    Spouse name: Darina   Number of children: 2   Years of education: Not on file   Highest education level: High school graduate  Occupational History   Occupation: retired  Tobacco Use   Smoking status: Former    Current packs/day: 0.00    Average packs/day: 1 pack/day for 30.0 years (30.0 ttl pk-yrs)    Types: Cigarettes    Start date: 04/30/1974    Quit date: 04/30/2004    Years since quitting: 20.0    Passive exposure: Past   Smokeless tobacco: Never  Vaping Use   Vaping status: Never Used  Substance and Sexual Activity   Alcohol  use: No   Drug use: No   Sexual activity: Not Currently    Birth control/protection: Post-menopausal  Other Topics Concern   Not on file  Social History Narrative   ** Merged History Encounter ** ** Merged History Encounter **       Lives in home with mentally handicap son   Social Drivers of Corporate investment banker Strain: Low Risk  (01/29/2024)   Received from [facility]   Overall Financial Resource Strain (CARDIA)    Difficulty of Paying Living Expenses: Not hard at all  Food Insecurity: No Food Insecurity (01/29/2024)   Received from [facility]   Hunger Vital Sign    Within the past 12 months, you worried that your food would run out before you got the money to buy more.: Never true    Within the past 12 months, the food you bought just didn't last and you didn't have money to get more.: Never true  Transportation Needs: No Transportation Needs (01/29/2024)   Received from [facility] - Transportation    In the past 12 months, has lack of transportation kept you from medical appointments or from getting medications?: No    Lack of  Transportation (Non-Medical): No  Physical Activity: Inactive (12/29/2023)   Exercise Vital Sign    Days of Exercise per Week: 0 days    Minutes of Exercise per Session: 0 min  Stress: No Stress Concern Present (12/29/2023)   [employer] of Occupational Health - Occupational Stress Questionnaire    Feeling of Stress : Only a little  Social Connections: Moderately Isolated (12/29/2023)   Social Connection and Isolation Panel    Frequency of Communication with Friends and Family: More than three times a week    Frequency of Social Gatherings with Friends and  Family: More than three times a week    Attends Religious Services: More than 4 times per year    Active Member of Clubs or Organizations: No    Attends Banker Meetings: Never    Marital Status: Separated  Intimate Partner Violence: Not At Risk (12/29/2023)   Humiliation, Afraid, Rape, and Kick questionnaire    Fear of Current or Ex-Partner: No    Emotionally Abused: No    Physically Abused: No    Sexually Abused: No    Family History  Problem Relation Age of Onset   Cancer Mother 92       lung   Thyroid  disease Mother    Cancer Father 36       Pancreatic   Hypertension Sister    Cancer Sister        cancer on face   Hyperlipidemia Sister    Osteoporosis Maternal Grandmother    Cancer Paternal Grandmother        colon   Arthritis Paternal Grandfather    Hyperlipidemia Son    Seizures Son    Breast cancer Neg Hx      Current Outpatient Medications:    ACCU-CHEK AVIVA PLUS test strip, TEST THREE TIMES DAILY, Disp: 300 strip, Rfl: 3   Accu-Chek Softclix Lancets lancets, TEST BLOOD SUGAR THREE TIMES DAILY, Disp: 300 each, Rfl: 3   albuterol  (PROVENTIL ) (2.5 MG/3ML) 0.083% nebulizer solution, Take 3 mLs (2.5 mg total) by nebulization every 4 (four) hours as needed for wheezing or shortness of breath., Disp: 75 mL, Rfl: 1   albuterol  (VENTOLIN  HFA) 108 (90 Base) MCG/ACT inhaler, Inhale 2 puffs into the  lungs every 6 (six) hours as needed for wheezing or shortness of breath., Disp: 1 each, Rfl: 1   apixaban  (ELIQUIS ) 5 MG TABS tablet, Take 1 tablet (5 mg total) by mouth 2 (two) times daily., Disp: 180 tablet, Rfl: 3   atorvastatin  (LIPITOR) 40 MG tablet, Take 1 tablet (40 mg total) by mouth daily., Disp: 90 tablet, Rfl: 3   Blood Glucose Monitoring Suppl (TRUE METRIX METER) w/Device KIT, Use to check blood sugar 4 times a day, Disp: 1 kit, Rfl: 4   busPIRone  (BUSPAR ) 5 MG tablet, Take 1 tablet (5 mg total) by mouth 2 (two) times daily., Disp: 30 tablet, Rfl: 1   cholecalciferol  (VITAMIN D3) 25 MCG (1000 UNIT) tablet, Take 1,000 Units by mouth daily., Disp: , Rfl:    Continuous Glucose Receiver (FREESTYLE LIBRE 2 READER) DEVI, To check blood sugars 3-4 times daily due to insulin  use. DX E11.40, Disp: 2 each, Rfl: 5   Continuous Glucose Sensor (FREESTYLE LIBRE 2 SENSOR) MISC, USE 1 SENSOR TO CHECK BLOOD SUGARS 3-4 TIMES DAILY DUE TO INSULIN  USE, Disp: 2 each, Rfl: 0   Cyanocobalamin  1000 MCG/ML KIT, Inject 1,000 mcg as directed every 30 (thirty) days. Next injection 3/21, Disp: , Rfl:    cyclobenzaprine  (FLEXERIL ) 5 MG tablet, Take 5 mg by mouth daily as needed for muscle spasms., Disp: , Rfl:    dapagliflozin  propanediol (FARXIGA ) 10 MG TABS tablet, Take 1 tablet (10 mg total) by mouth daily before breakfast., Disp: 90 tablet, Rfl: 3   docusate sodium  (COLACE) 100 MG capsule, Take 1 capsule (100 mg total) by mouth 2 (two) times daily as needed for mild constipation., Disp: 30 capsule, Rfl: 0   Dulaglutide  (TRULICITY ) 4.5 MG/0.5ML SOAJ, Inject 4.5 mg into the skin once a week., Disp: 12 mL, Rfl: 3   ENTRESTO  24-26 MG,  Take 1 tablet by mouth 2 (two) times daily., Disp: , Rfl:    folic acid  (FOLVITE ) 1 MG tablet, TAKE 1 TABLET EVERY DAY, Disp: 90 tablet, Rfl: 0   furosemide  (LASIX ) 40 MG tablet, Take 0.5 tablets (20 mg total) by mouth daily., Disp: 30 tablet, Rfl: 1   gabapentin  (NEURONTIN ) 400 MG  capsule, Take 1 capsule (400 mg total) by mouth 3 (three) times daily., Disp: 90 capsule, Rfl: 12   insulin  detemir (LEVEMIR ) 100 UNIT/ML injection, 30 Units., Disp: , Rfl:    insulin  lispro (HUMALOG  KWIKPEN) 100 UNIT/ML KwikPen, Inject 5 Units into the skin 3 (three) times daily before meals. Do not inject insulin  if you are not going to eat meal.  Only take before a meal., Disp: 15 mL, Rfl: 3   metFORMIN  (GLUCOPHAGE ) 1000 MG tablet, Take 1 tablet (1,000 mg total) by mouth 2 (two) times daily with a meal., Disp: 180 tablet, Rfl: 3   metoprolol  succinate (TOPROL -XL) 50 MG 24 hr tablet, Take 1 tablet (50 mg total) by mouth daily. Take with or immediately following a meal., Disp: 90 tablet, Rfl: 3   montelukast  (SINGULAIR ) 10 MG tablet, Take 1 tablet (10 mg total) by mouth at bedtime., Disp: 90 tablet, Rfl: 3   naloxone  (NARCAN ) nasal spray 4 mg/0.1 mL, Place 1 spray into the nose as needed for up to 365 doses (for opioid-induced respiratory depresssion). In case of emergency (overdose), spray once into each nostril. If no response within 3 minutes, repeat application and call 911., Disp: 1 each, Rfl: 1   Nebulizers (EASY AIR COMPRESSOR NEBULIZER) MISC, Use to inhaler nebulizer treatments at needed per instructions on nebulizer prescription, Disp: 1 each, Rfl: 0   OXYGEN , Inhale 2 L into the lungs at bedtime., Disp: , Rfl:    PAIN MANAGEMENT INTRATHECAL, IT, PUMP, 1 each by Intrathecal route. Intrathecal (IT) medication:  Morphine  5.0 mg/ml, Bupivacaine  20.0 mg/ml,  Clonidine  100.0 mcg/ml Patient does not remember current. Adjusted every 3 months at [facility], Disp: , Rfl:    pantoprazole  (PROTONIX ) 40 MG tablet, Take 1 tablet (40 mg total) by mouth daily., Disp: 90 tablet, Rfl: 3   TRELEGY ELLIPTA  100-62.5-25 MCG/ACT AEPB, INHALE 1 PUFF INTO THE LUNGS DAILY . 42 DAY EXPIRATION AFTER OPENING FOIL TRAY, Disp: 180 each, Rfl: 3   TRESIBA  FLEXTOUCH 100 UNIT/ML FlexTouch Pen, Inject 55 Units into  the skin at bedtime., Disp: 30 mL, Rfl: 5   venlafaxine  XR (EFFEXOR -XR) 150 MG 24 hr capsule, Take 1 capsule (150 mg total) by mouth daily., Disp: 90 capsule, Rfl: 3  Current Facility-Administered Medications:    cyanocobalamin  (VITAMIN B12) injection 1,000 mcg, 1,000 mcg, Intramuscular, Q30 days, [name], Jolene T, NP, 1,000 mcg at 04/22/24 0800  Physical exam:  Vitals:   04/27/24 1019  BP: (!) 113/32  Pulse: 88  Resp: 18  Temp: (!) 96.3 F (35.7 C)  TempSrc: Tympanic  SpO2: 100%  Weight: 145 lb 4.8 oz (65.9 kg)  Height: 4' 10 (1.473 m)   Physical Exam Constitutional:      Comments: She is on home oxygen   Eyes:     Pupils: Pupils are equal, round, and reactive to light.  Cardiovascular:     Rate and Rhythm: Normal rate and regular rhythm.     Heart sounds: Normal heart sounds.  Pulmonary:     Effort: Pulmonary effort is normal.     Breath sounds: Normal breath sounds.  Abdominal:     General: Bowel sounds  are normal.     Palpations: Abdomen is soft.  Skin:    General: Skin is warm and dry.  Neurological:     Mental Status: She is alert and oriented to person, place, and time.      I have personally reviewed labs listed below:    Latest Ref Rng & Units 04/12/2024   12:49 PM  CMP  Glucose 70 - 99 mg/dL 814   BUN 8 - 23 mg/dL 19   Creatinine 9.55 - 1.00 mg/dL 8.82   Sodium 864 - 854 mmol/L 135   Potassium 3.5 - 5.1 mmol/L 4.4   Chloride 98 - 111 mmol/L 96   CO2 22 - 32 mmol/L 28   Calcium  8.9 - 10.3 mg/dL 9.1       Latest Ref Rng & Units 03/01/2024   10:36 AM  CBC  WBC 4.0 - 10.5 K/uL 9.9   Hemoglobin 12.0 - 15.0 g/dL 88.1   Hematocrit 63.9 - 46.0 % 37.7   Platelets 150 - 400 K/uL 439      Assessment and plan- Patient is a 73 y.o. female here for routine follow-up of IgG kappa MGUS  Patient has had low risk IgG kappa MGUS at least dating back to 2022 with an M protein of 0.8 g.  Recent labs from 03/01/2024 showed free light chain ratio 1.83.  Recent M  protein from July 2025 was also 0.6 g.  CBC shows H&H of 11.8/37.7 from July 2025 which is at her baseline.  She has had some mild intermittent thrombocytosis and prior testing for myeloproliferative neoplasm was unremarkable and we will continue to monitor.  History of pulmonary embolism: She will continue with anticoagulation indefinitely  CBC with differential CMP myeloma panel and serum free light chains in 6 months in 1 year and I will see her back in 1 year   Visit Diagnosis 1. MGUS (monoclonal gammopathy of unknown significance)   2. Neutrophilia      Dr. Annah Skene, MD, MPH [facility] at [facility] [phone] 04/27/2024 10:29 AM

## 2024-05-11 ENCOUNTER — Other Ambulatory Visit: Payer: Self-pay | Admitting: Nurse Practitioner

## 2024-05-11 DIAGNOSIS — Z1231 Encounter for screening mammogram for malignant neoplasm of breast: Secondary | ICD-10-CM

## 2024-05-17 ENCOUNTER — Other Ambulatory Visit: Payer: Self-pay | Admitting: Nurse Practitioner

## 2024-05-19 ENCOUNTER — Other Ambulatory Visit: Payer: Self-pay

## 2024-05-19 NOTE — Patient Outreach (Signed)
 Complex Care Management   Visit Note  05/19/2024  Name:  Jordan Chan MRN: [id number] DOB: Oct 14, 1950  Situation: Referral received for Complex Care Management related to COPD and Diabetes I obtained verbal consent from Patient.  Visit completed with Jordan Chan via telephone.  Background:   Past Medical History:  Diagnosis Date   (HFpEF) heart failure with preserved ejection fraction (HCC)    Anemia    Anxiety    Aortic atherosclerosis    Arthritis    Asthma    Bilateral pulmonary embolism (small subsegmental) 08/11/2023   Breast cancer, right (HCC) 1998   a.) Tx'd with surgical excision + chemotherapy + XRT   Bronchitis    Cerebral microvascular disease    Chronic pain syndrome    CKD (chronic kidney disease), stage III (HCC)    COPD (chronic obstructive pulmonary disease) (HCC)    DDD (degenerative disc disease), cervical    Decreased ROM of intervertebral discs of cervical spine    Dependence on nocturnal oxygen  therapy (2L/[place])    Depression    Diabetes mellitus type 2, insulin  dependent (HCC)    Diverticulitis    Dyspnea    Endometriosis    GERD (gastroesophageal reflux disease)    History of shingles    Hypercholesteremia    Hypertension    IBS (irritable bowel syndrome)    Low back pain    a.) has intrathecal pump (morphine /bupivicaine/clonidine )   Neuropathy    On apixaban  therapy    Opioid withdrawal (HCC)    a.) experiences withdrawal symptoms when batteries in intrathecal pump reach EOL   Orthopnea    PAF (paroxysmal atrial fibrillation) (HCC)    a.) CHA2DS2VASc = 8 (age, sex, HFpEF, HTN, TIA/PE x 2, vascular disease, T2DM) as of metoprolol  succinate; b.) rate/rhythm maintained on oral metoprolol  succinate; chronically anticoagulated with apixaban    Personal history of chemotherapy    Personal history of radiation therapy    Pneumonia    Scoliosis    Sleep apnea    a.) no nocturnal PAP therapy; requires supplemental oxygen  at bedtime   Status  post bilateral cataract extraction 2020   Takotsubo cardiomyopathy    TIA (transient ischemic attack) 2010     Assessment: Patient Reported Symptoms: Cognitive Cognitive Status: Alert and oriented to person, place, and time, Normal speech and language skills Cognitive/Intellectual Conditions Management [RPT]: None reported or documented in medical history or problem list Health Maintenance Behaviors: Annual physical exam, Healthy diet, Social activities Healing Pattern: Average Health Facilitated by: Pain control, Rest  Neurological Neurological Review of Symptoms: No symptoms reported Neurological Management Strategies: Routine screening Neurological Self-Management Outcome: 4 (good)  HEENT HEENT Symptoms Reported: No symptoms reported HEENT Management Strategies: Routine screening HEENT Self-Management Outcome: 4 (good)  Cardiovascular Cardiovascular Symptoms Reported: No symptoms reported Does patient have uncontrolled Hypertension?: No Cardiovascular Self-Management Outcome: 4 (good)  Respiratory Respiratory Symptoms Reported: No symptoms reported Respiratory Self-Management Outcome: 4 (good)  Endocrine Endocrine Symptoms Reported: No symptoms reported Is patient diabetic?: Yes Is patient checking blood sugars at home?: Yes List most recent blood sugar readings, include date and time of day: Reports fasting readings have been within range. Postprandial readings have ranged in the low 200s. Currently monitoring via FreeStyle Libre device Endocrine Self-Management Outcome: 4 (good)  Gastrointestinal Gastrointestinal Symptoms Reported: No symptoms reported Gastrointestinal Self-Management Outcome: 4 (good)  Genitourinary Genitourinary Symptoms Reported: No symptoms reported Genitourinary Self-Management Outcome: 4 (good)  Integumentary Integumentary Symptoms Reported: No symptoms reported Skin Self-Management Outcome: 4 (good)  Musculoskeletal Musculoskelatal Symptoms Reviewed:  Other Other Musculoskeletal Symptoms: Denies current concerns. Reports improved balance and range of motion following outpatient [facility] at [facility] Musculoskeletal Self-Management Outcome: 4 (good)  Psychosocial Psychosocial Symptoms Reported: No symptoms reported Quality of Family Relationships: supportive Do you feel physically threatened by others?: No    05/19/2024    PHQ2-9 Depression Screening   Little interest or pleasure in doing things Not at all  Feeling down, depressed, or hopeless Not at all  PHQ-2 - Total Score 0   There were no vitals filed for this visit.  Medications Reviewed Today     Reviewed by Karoline Lima, RN (Registered Nurse) on 05/19/24 at 1036  Med List Status: <None>   Medication Order Taking? Sig Documenting Provider Last Dose Status Informant  ACCU-CHEK AVIVA PLUS test strip [id number]  TEST THREE TIMES DAILY [name], Jolene T, NP  Active Self           Med Note DARRICK, CRYSTAL L   Mon Aug 24, 2023  9:57 AM) Uses Libre device   Accu-Chek Softclix Lancets lancets [id number]  TEST BLOOD SUGAR THREE TIMES DAILY [name], Jolene T, NP  Active   albuterol  (PROVENTIL ) (2.5 MG/3ML) 0.083% nebulizer solution [id number]  Take 3 mLs (2.5 mg total) by nebulization every 4 (four) hours as needed for wheezing or shortness of breath. Kadali, Renuka A, MD  Active            Med Note (LLOYD, CRYSTAL L   Mon Aug 24, 2023 10:07 AM) [place] recently can't find neb machine   albuterol  (VENTOLIN  HFA) 108 (90 Base) MCG/ACT inhaler [id number]  Inhale 2 puffs into the lungs every 6 (six) hours as needed for wheezing or shortness of breath. Kadali, Renuka A, MD  Active   apixaban  (ELIQUIS ) 5 MG TABS tablet [id number]  Take 1 tablet (5 mg total) by mouth 2 (two) times daily. [name], Jolene T, NP  Active   atorvastatin  (LIPITOR) 40 MG tablet [id number]  Take 1 tablet (40 mg total) by mouth daily. [name], Jolene T, NP  Active   Blood Glucose Monitoring Suppl (TRUE METRIX  METER) w/Device KIT [id number]  Use to check blood sugar 4 times a day [name], Jolene T, NP  Active Self  busPIRone  (BUSPAR ) 5 MG tablet [id number]  Take 1 tablet (5 mg total) by mouth 2 (two) times daily. Kadali, Renuka A, MD  Active   cholecalciferol  (VITAMIN D3) 25 MCG (1000 UNIT) tablet [id number]  Take 1,000 Units by mouth daily. [provider]  Active Self  Continuous Glucose Receiver (FREESTYLE LIBRE 2 READER) DEVI [id number]  To check blood sugars 3-4 times daily due to insulin  use. DX E11.40 Valerio Moris T, NP  Active Self  Continuous Glucose Sensor (FREESTYLE LIBRE 2 SENSOR) MISC [id number]  USE 1 SENSOR TO CHECK BLOOD SUGARS 3-4 TIMES DAILY DUE TO INSULIN  USE [name], Jolene T, NP  Active   cyanocobalamin  (VITAMIN B12) injection 1,000 mcg [id number]   [name], Jolene T, NP  Active   Cyanocobalamin  1000 MCG/ML KIT [id number]  Inject 1,000 mcg as directed every 30 (thirty) days. Next injection 3/21 [provider]  Active Self  cyclobenzaprine  (FLEXERIL ) 5 MG tablet [id number]  Take 5 mg by mouth daily as needed for muscle spasms. [provider]  Active   dapagliflozin  propanediol (FARXIGA ) 10 MG TABS tablet [id number]  Take 1 tablet (10 mg total) by mouth daily before breakfast. [place] A, FNP  Active   docusate sodium  (COLACE) 100 MG capsule  [id number]  Take 1 capsule (100 mg total) by mouth 2 (two) times daily as needed for mild constipation. Kadali, Renuka A, MD  Active   Dulaglutide  (TRULICITY ) 4.5 MG/0.5ML SOAJ [id number]  Inject 4.5 mg into the skin once a week. [name], Jolene T, NP  Active   ENTRESTO  24-26 MG [id number]  Take 1 tablet by mouth 2 (two) times daily. [provider]  Active   folic acid  (FOLVITE ) 1 MG tablet [id number]  TAKE 1 TABLET EVERY DAY Dasie Tinnie MATSU, NP  Active Self  furosemide  (LASIX ) 40 MG tablet [id number]  Take 0.5 tablets (20 mg total) by mouth daily. Kadali, Renuka A, MD  Active   gabapentin  (NEURONTIN ) 400 MG capsule  [id number]  Take 1 capsule (400 mg total) by mouth 3 (three) times daily. [name], Jolene T, NP  Active   insulin  detemir (LEVEMIR ) 100 UNIT/ML injection [id number]  30 Units. [provider]  Active   insulin  lispro (HUMALOG  KWIKPEN) 100 UNIT/ML KwikPen [id number]  Inject 5 Units into the skin 3 (three) times daily before meals. Do not inject insulin  if you are not going to eat meal.  Only take before a meal. [name], Jolene T, NP  Active   metFORMIN  (GLUCOPHAGE ) 1000 MG tablet [id number]  Take 1 tablet (1,000 mg total) by mouth 2 (two) times daily with a meal. [name], Jolene T, NP  Active   metoprolol  succinate (TOPROL -XL) 50 MG 24 hr tablet [id number]  Take 1 tablet (50 mg total) by mouth daily. Take with or immediately following a meal. Donette Ellouise LABOR, FNP  Expired 04/27/24 2359   montelukast  (SINGULAIR ) 10 MG tablet [id number]  Take 1 tablet (10 mg total) by mouth at bedtime. Valerio Moris T, NP  Active   naloxone  (NARCAN ) nasal spray 4 mg/0.1 mL [id number]  Place 1 spray into the nose as needed for up to 365 doses (for opioid-induced respiratory depresssion). In case of emergency (overdose), spray once into each nostril. If no response within 3 minutes, repeat application and call 911. Naveira, Francisco, MD  Active   Nebulizers (EASY AIR COMPRESSOR NEBULIZER) MISC [id number]  Use to inhaler nebulizer treatments at needed per instructions on nebulizer prescription [name], Jolene T, NP  Active Self  OXYGEN  [id number]  Inhale 2 L into the lungs at bedtime. [provider]  Active            Med Note CATHY, PAULA   Tue Dec 29, 2023  1:15 PM) continuous  PAIN MANAGEMENT INTRATHECAL, IT, PUMP [id number]  1 each by Intrathecal route. Intrathecal (IT) medication:  Morphine  5.0 mg/ml, Bupivacaine  20.0 mg/ml,  Clonidine  100.0 mcg/ml Patient does not remember current. Adjusted every 3 months at [facility] [provider]  Active Self           Med Note GAYLENE DARVIN JINNY [place] Apr 16, 2023  6:23 AM) Morphine , Bupivacaine  and clonidine    pantoprazole  (PROTONIX ) 40 MG tablet [id number]  Take 1 tablet (40 mg total) by mouth daily. [name], Jolene T, NP  Active   TRELEGY ELLIPTA  100-62.5-25 MCG/ACT AEPB [id number]  INHALE 1 PUFF INTO THE LUNGS DAILY . 42 DAY EXPIRATION AFTER OPENING FOIL TRAY [name], Jolene T, NP  Active   TRESIBA  FLEXTOUCH 100 UNIT/ML FlexTouch Pen [id number]  Inject 55 Units into the skin at bedtime. [name], Jolene T, NP  Active   venlafaxine  XR (EFFEXOR -XR) 150 MG 24 hr capsule [id number]  Take 1 capsule (150 mg total) by mouth daily. [name], Jolene T,  NP  Active   Med List Note Geanie Wolm HERO, RN 12/15/23 1412): 12-15-2023  Clearance to stop Eliquis  for 3 days prior to pump replacement sent to   [facility]onda Lucy md  kt            Recommendation:   Continue Current Plan of Care  Follow Up Plan:   Telephone follow up appointment with Nurse Case Manager on June 24, 2024.   Jackson Acron [facility] Population Health RN Care Manager Direct Dial : [phone]  Fax: [phone] Website: [email or web address]

## 2024-05-19 NOTE — Telephone Encounter (Signed)
 Requested Prescriptions  Pending Prescriptions Disp Refills   Continuous Glucose Sensor (FREESTYLE LIBRE 2 SENSOR) MISC [Pharmacy Med Name: FREESTYLE LIBRE 2 SEN 14D KIT] 2 each 0    Sig: USE 1 SENSOR TO CHECK BLOOD SURGARS 3-4 TIMES DAILY DUE TO INSULIN  USE     Endocrinology: Diabetes - Testing Supplies Passed - 05/19/2024 11:50 AM      Passed - Valid encounter within last 12 months    Recent Outpatient Visits           1 month ago Insulin  dependent type 2 diabetes mellitus (HCC)   [place] [facility] [place], [place] T, NP   5 months ago Insulin  dependent type 2 diabetes mellitus (HCC)   [place] [facility] [place], Melanie DASEN, NP   6 months ago Torticollis   [facility] [place], [place] T, NP   8 months ago Insulin  dependent type 2 diabetes mellitus (HCC)   [place] [facility] [name], NP       Future Appointments             In 1 month Dunn, Bernardino HERO, PA-C  HeartCare at [facility]

## 2024-05-20 ENCOUNTER — Other Ambulatory Visit: Payer: Self-pay | Admitting: Nurse Practitioner

## 2024-05-20 MED ORDER — ONDANSETRON 4 MG PO TBDP: 4.0000 mg | ORAL_TABLET | Freq: Three times a day (TID) | ORAL | 0 refills | Status: AC | PRN

## 2024-05-20 NOTE — Patient Instructions (Addendum)
 Thank you for allowing the Complex Care Management team to participate in your care. It was great speaking with you!  Reminders: -Please complete your Pulmonology follow up with Dr. Theotis as scheduled on June 15, 2024. -Please complete your PCP appointment as scheduled on June 22, 2024.  We will follow up on June 24, 2024 at 1100.  Please do not hesitate to contact me if you require assistance prior to our next outreach.    Jackson Acron [facility] Population Health RN Care Manager Direct Dial : [phone]  Fax: [phone] Website: [email or web address]

## 2024-05-23 ENCOUNTER — Ambulatory Visit

## 2024-05-24 ENCOUNTER — Ambulatory Visit

## 2024-05-24 ENCOUNTER — Other Ambulatory Visit: Payer: Self-pay

## 2024-05-24 DIAGNOSIS — E538 Deficiency of other specified B group vitamins: Secondary | ICD-10-CM | POA: Diagnosis not present

## 2024-05-24 MED ORDER — FREESTYLE LIBRE 2 SENSOR MISC: 4 refills | Status: AC

## 2024-05-24 NOTE — Progress Notes (Signed)
 Patient is in office today for a nurse visit for B12 Injection. Patient Injection was given in the  Left deltoid. Patient tolerated injection well.

## 2024-05-25 ENCOUNTER — Ambulatory Visit: Attending: Physician Assistant

## 2024-05-25 DIAGNOSIS — I428 Other cardiomyopathies: Secondary | ICD-10-CM

## 2024-05-25 LAB — ECHOCARDIOGRAM COMPLETE
AR max vel: 1.4 cm2
AV Area VTI: 1.24 cm2
AV Area mean vel: 1.34 cm2
AV Mean grad: 5 mmHg
AV Peak grad: 8.3 mmHg
Ao pk vel: 1.44 m/s
Area-P 1/2: 4.49 cm2
S' Lateral: 3.73 cm

## 2024-05-27 ENCOUNTER — Ambulatory Visit: Payer: Self-pay | Admitting: Physician Assistant

## 2024-05-31 ENCOUNTER — Other Ambulatory Visit: Payer: Self-pay

## 2024-05-31 MED ORDER — PAIN MANAGEMENT IT PUMP REFILL: 1.0000 | Freq: Once | INTRATHECAL | 0 refills | Status: DC

## 2024-05-31 MED ORDER — PAIN MANAGEMENT IT PUMP REFILL: 1.0000 | Freq: Once | INTRATHECAL | 0 refills | Status: AC

## 2024-05-31 NOTE — Progress Notes (Signed)
 SABRA

## 2024-06-02 ENCOUNTER — Other Ambulatory Visit: Payer: Self-pay | Admitting: Nurse Practitioner

## 2024-06-02 NOTE — Telephone Encounter (Unsigned)
 Copied from CRM #[id number]. Topic: Clinical - Medication Refill >> Jun 02, 2024  8:56 AM Emylou G wrote: Medication: Continuous Glucose Sensor (FREESTYLE LIBRE 2 SENSOR) MISC  Has the patient contacted their pharmacy? Yes (Agent: If no, request that the patient contact the pharmacy for the refill. If patient does not wish to contact the pharmacy document the reason why and proceed with request.) (Agent: If yes, when and what did the pharmacy advise?) said to call us   This is the patient's preferred pharmacy:  [facility] [address], [place] - [address] [place] [address] Phone: [phone] Fax: [phone]   Is this the correct pharmacy for this prescription? Yes If no, delete pharmacy and type the correct one.   Has the prescription been filled recently? No  Is the patient out of the medication? Yes  Has the patient been seen for an appointment in the last year OR does the patient have an upcoming appointment? Yes  Can we respond through MyChart? No  Agent: Please be advised that Rx refills may take up to 3 business days. We ask that you follow-up with your pharmacy.

## 2024-06-03 NOTE — Telephone Encounter (Signed)
 Requested by patient. Duplicate request. Receipt confirmed by pharmacy 05/24/24 at 1:10 pm.  Requested Prescriptions  Refused Prescriptions Disp Refills   Continuous Glucose Sensor (FREESTYLE LIBRE 2 SENSOR) MISC 6 each 4    Sig: USE 1 SENSOR TO CHECK BLOOD SURGARS 3-4 TIMES DAILY DUE TO INSULIN  USE     Endocrinology: Diabetes - Testing Supplies Passed - 06/03/2024  3:47 PM      Passed - Valid encounter within last 12 months    Recent Outpatient Visits           2 months ago Insulin  dependent type 2 diabetes mellitus (HCC)   [place] [facility] [place], [place] T, NP   5 months ago Insulin  dependent type 2 diabetes mellitus (HCC)   [place] [facility] [place], Melanie DASEN, NP   6 months ago Torticollis   [place] [facility] [place], [place] T, NP   8 months ago Insulin  dependent type 2 diabetes mellitus (HCC)   [place] [facility] [name], NP       Future Appointments             In 1 month Dunn, Bernardino HERO, PA-C [place] [facility]

## 2024-06-08 ENCOUNTER — Telehealth: Payer: Self-pay | Admitting: Nurse Practitioner

## 2024-06-08 NOTE — Telephone Encounter (Signed)
 Copied from CRM [id number]. Topic: Clinical - Medication Question >> Jun 06, 2024 10:24 AM Myrick T wrote: Reason for CRM: advised patient that the Continuous Glucose Sensor (FREESTYLE LIBRE 2 SENSOR) MISC was sent in to [facility] on 10/21. Patient said she would go to the pharmacy to pick them up >> Jun 07, 2024  2:58 PM Antony S wrote: Patient calling today to tell us  she need the prior auth paperwork filled out and sent back today, her freestyle libre sensor has 4 hours left, can't pick it up from [facility] until that's sent back to [facility]

## 2024-06-10 ENCOUNTER — Ambulatory Visit
Admission: RE | Admit: 2024-06-10 | Discharge: 2024-06-10 | Disposition: A | Discharge status: Home / Self Care | Source: Ambulatory Visit | Attending: Nurse Practitioner | Admitting: Nurse Practitioner

## 2024-06-10 DIAGNOSIS — Z1231 Encounter for screening mammogram for malignant neoplasm of breast: Secondary | ICD-10-CM | POA: Diagnosis not present

## 2024-06-11 ENCOUNTER — Other Ambulatory Visit: Payer: Self-pay | Admitting: Nurse Practitioner

## 2024-06-13 NOTE — Telephone Encounter (Signed)
 Call to patient- wants to keep Rx at [facility] Requested Prescriptions  Pending Prescriptions Disp Refills   metFORMIN  (GLUCOPHAGE ) 1000 MG tablet [Pharmacy Med Name: METFORMIN  HYDROCHLORIDE 1000 MG Oral Tablet] 180 tablet 3    Sig: TAKE 1 TABLET TWICE DAILY WITH MEALS     Endocrinology:  Diabetes - Biguanides Failed - 06/13/2024  1:46 PM      Failed - Cr in normal range and within 360 days    Creatinine  Date Value Ref Range Status  09/14/2013 0.68 0.60 - 1.30 mg/dL Final   Creatinine, Ser  Date Value Ref Range Status  04/12/2024 1.17 (H) 0.44 - 1.00 mg/dL Final         Failed - eGFR in normal range and within 360 days    EGFR (African American)  Date Value Ref Range Status  09/14/2013 >60  Final   GFR calc Af Amer  Date Value Ref Range Status  07/26/2020 92 >59 mL/min/1.73 Final    Comment:    **In accordance with recommendations from the NKF-ASN Task force,**   [facility] is in the process of updating its eGFR calculation to the   2021 CKD-EPI creatinine equation that estimates kidney function   without a race variable.    EGFR (Non-African Amer.)  Date Value Ref Range Status  09/14/2013 >60  Final    Comment:    eGFR values <15mL/min/1.73 m2 may be an indication of chronic kidney disease (CKD). Calculated eGFR is useful in patients with stable renal function. The eGFR calculation will not be reliable in acutely ill patients when serum creatinine is changing rapidly. It is not useful in  patients on dialysis. The eGFR calculation may not be applicable to patients at the low and high extremes of body sizes, pregnant women, and vegetarians.    GFR, Estimated  Date Value Ref Range Status  04/12/2024 49 (L) >60 mL/min Final    Comment:    (NOTE) Calculated using the CKD-EPI Creatinine Equation (2021)    eGFR  Date Value Ref Range Status  04/01/2024 40 (L) >59 mL/min/1.73 Final         Passed - HBA1C is between 0 and 7.9 and within 180 days    Hemoglobin A1C   Date Value Ref Range Status  04/28/2016 7.7%  Final   HB A1C (BAYER DCA - WAIVED)  Date Value Ref Range Status  03/21/2024 7.4 (H) 4.8 - 5.6 % Final    Comment:             Prediabetes: 5.7 - 6.4          Diabetes: >6.4          Glycemic control for adults with diabetes: <7.0          Passed - B12 Level in normal range and within 720 days    Vitamin B-12  Date Value Ref Range Status  12/18/2023 907 232 - 1,245 pg/mL Final         Passed - Valid encounter within last 6 months    Recent Outpatient Visits           2 months ago Insulin  dependent type 2 diabetes mellitus (HCC)   [facility] [place], [place] T, NP   5 months ago Insulin  dependent type 2 diabetes mellitus (HCC)   [place] [facility] [place], [place] T, NP   6 months ago Torticollis   [place] [facility] [place], [place] T, NP   9 months  ago Insulin  dependent type 2 diabetes mellitus (HCC)   [place] [facility] [place], Melanie T, NP       Future Appointments             In 4 weeks Dunn, Bernardino HERO, PA-C [place] [facility] - CBC within normal limits and completed in the last 12 months    WBC  Date Value Ref Range Status  03/01/2024 9.9 4.0 - 10.5 K/uL Final   RBC  Date Value Ref Range Status  03/01/2024 4.25 3.87 - 5.11 MIL/uL Final   Hemoglobin  Date Value Ref Range Status  03/01/2024 11.8 (L) 12.0 - 15.0 g/dL Final  94/83/7974 87.9 11.1 - 15.9 g/dL Final   HCT  Date Value Ref Range Status  03/01/2024 37.7 36.0 - 46.0 % Final   Hematocrit  Date Value Ref Range Status  12/18/2023 38.9 34.0 - 46.6 % Final   MCHC  Date Value Ref Range Status  03/01/2024 31.3 30.0 - 36.0 g/dL Final   [facility]  Date Value Ref Range Status  03/01/2024 27.8 26.0 - 34.0 pg Final   MCV  Date Value Ref Range Status  03/01/2024 88.7 80.0 - 100.0 fL Final  12/18/2023 92 79 - 97 fL Final  09/14/2013 89 80 - 100  fL Final   No results found for: PLTCOUNTKUC, LABPLAT, POCPLA RDW  Date Value Ref Range Status  03/01/2024 14.3 11.5 - 15.5 % Final  12/18/2023 13.0 11.7 - 15.4 % Final  09/14/2013 14.7 (H) 11.5 - 14.5 % Final

## 2024-06-14 ENCOUNTER — Telehealth: Payer: Self-pay | Admitting: Nurse Practitioner

## 2024-06-14 ENCOUNTER — Other Ambulatory Visit: Payer: Self-pay | Admitting: Nurse Practitioner

## 2024-06-14 DIAGNOSIS — R928 Other abnormal and inconclusive findings on diagnostic imaging of breast: Secondary | ICD-10-CM

## 2024-06-14 NOTE — Telephone Encounter (Signed)
 Patient had called office concerned about breast screening results and wanting to talk to PCP. Called patient and reviewed findings. She is very concerned due to her history of breast cancer and reports this is how it started last time, with finding a mass. Discussed with her that this could be a benign finding and offered reassurance at this time. She is scheduled for repeat imaging in 2 days, will follow-up with her after this. She stated appreciation for call.

## 2024-06-15 DIAGNOSIS — J449 Chronic obstructive pulmonary disease, unspecified: Secondary | ICD-10-CM | POA: Diagnosis not present

## 2024-06-15 DIAGNOSIS — Z7901 Long term (current) use of anticoagulants: Secondary | ICD-10-CM | POA: Diagnosis not present

## 2024-06-15 DIAGNOSIS — Z86711 Personal history of pulmonary embolism: Secondary | ICD-10-CM | POA: Diagnosis not present

## 2024-06-15 DIAGNOSIS — G4734 Idiopathic sleep related nonobstructive alveolar hypoventilation: Secondary | ICD-10-CM | POA: Diagnosis not present

## 2024-06-16 ENCOUNTER — Ambulatory Visit
Admission: RE | Admit: 2024-06-16 | Discharge: 2024-06-16 | Disposition: A | Discharge status: Home / Self Care | Source: Ambulatory Visit | Attending: Nurse Practitioner | Admitting: Nurse Practitioner

## 2024-06-16 DIAGNOSIS — R928 Other abnormal and inconclusive findings on diagnostic imaging of breast: Secondary | ICD-10-CM | POA: Insufficient documentation

## 2024-06-16 DIAGNOSIS — N6322 Unspecified lump in the left breast, upper inner quadrant: Secondary | ICD-10-CM | POA: Diagnosis not present

## 2024-06-18 NOTE — Patient Instructions (Signed)

## 2024-06-21 ENCOUNTER — Ambulatory Visit: Attending: Pain Medicine | Admitting: Pain Medicine

## 2024-06-21 ENCOUNTER — Encounter: Payer: Self-pay | Admitting: Pain Medicine

## 2024-06-21 VITALS — BP 120/55 | HR 90 | Temp 97.6°F | Resp 20 | Ht <= 58 in | Wt 142.0 lb

## 2024-06-21 DIAGNOSIS — G894 Chronic pain syndrome: Secondary | ICD-10-CM | POA: Diagnosis not present

## 2024-06-21 DIAGNOSIS — M79605 Pain in left leg: Secondary | ICD-10-CM | POA: Insufficient documentation

## 2024-06-21 DIAGNOSIS — M5410 Radiculopathy, site unspecified: Secondary | ICD-10-CM | POA: Diagnosis not present

## 2024-06-21 DIAGNOSIS — M545 Low back pain, unspecified: Secondary | ICD-10-CM | POA: Diagnosis not present

## 2024-06-21 DIAGNOSIS — M5459 Other low back pain: Secondary | ICD-10-CM | POA: Diagnosis not present

## 2024-06-21 DIAGNOSIS — M51372 Other intervertebral disc degeneration, lumbosacral region with discogenic back pain and lower extremity pain: Secondary | ICD-10-CM | POA: Diagnosis not present

## 2024-06-21 DIAGNOSIS — G8929 Other chronic pain: Secondary | ICD-10-CM | POA: Diagnosis not present

## 2024-06-21 DIAGNOSIS — E1142 Type 2 diabetes mellitus with diabetic polyneuropathy: Secondary | ICD-10-CM | POA: Diagnosis not present

## 2024-06-21 DIAGNOSIS — M79604 Pain in right leg: Secondary | ICD-10-CM | POA: Insufficient documentation

## 2024-06-21 DIAGNOSIS — Z451 Encounter for adjustment and management of infusion pump: Secondary | ICD-10-CM | POA: Diagnosis present

## 2024-06-21 DIAGNOSIS — Z978 Presence of other specified devices: Secondary | ICD-10-CM | POA: Insufficient documentation

## 2024-06-21 NOTE — Progress Notes (Signed)
 PROVIDER NOTE: Interpretation of information contained herein should be left to medically-trained personnel. Specific patient instructions are provided elsewhere under Patient Instructions section of medical record. This document was created in part using STT-dictation technology, any transcriptional errors that may result from this process are unintentional.  Patient: Jordan Chan Type: Established DOB: 05-Sep-1950 MRN: [id number] PCP: [name], NP  Service: Procedure DOS: 06/21/2024 Setting: Ambulatory Location: Ambulatory outpatient facility Delivery: Face-to-face Provider: Eric DELENA Como, MD Specialty: Interventional Pain Management Specialty designation: 09 Location: Outpatient facility Ref. Prov.: [name], Jolene T, NP       Interventional Therapy   Primary Reason for Visit: Interventional Pain Management Treatment. CC: Back Pain  Procedure:          Type: Management of Intrathecal Drug Delivery System (IDDS) - Reservoir Refill (04009). No rate change.  Indications: 1. Chronic pain syndrome   2. Chronic low back pain (1ry area of Pain) (Bilateral) (R>L) w/o sciatica   3. DDD (degenerative disc disease), lumbosacral w/ LBP & LEP   4. Low back pain of over 3 months duration   5. Intractable low back pain   6. Multifactorial low back pain   7. Chronic lower extremity pain (2ry area of Pain) (Bilateral) (R>L)   8. Chronic radicular pain of lower extremity   9. Diabetic peripheral neuropathy (HCC)   10. Presence of intrathecal pump   11. Encounter for adjustment and management of infusion pump    Pain Assessment: Self-Reported Pain Score: 6 /10             Reported level is compatible with observation.         IDDS (Intrathecal Drug Delivery System):  Reservoir  Battery:  Manufacturer: Medtronic  Model: Synchromed III Model No.: [id number]  Serial No.: [id number]  Delivery Route: Intrathecal  Type: Programmable  Volume Capacity (mL): 40 mL reservoir   Priming Volume: N/A  Calibration Constant: 111  MRI compatibility: Conditional  Implant Site: Abdominal (RLQ)  Laterality: Right   Catheter: Manufacturer: Medtronic Model:  InDura catheter Model No.: 8709 Lot No.: N/A  Implanted Length (cm): 77.5 cm  Catheter Volume (mL): 0.171 mL  Delivery Route: Intrathecal  Canal Access Site: T1-2  Tip Level: T7 Tip Location: Posterior Tip Laterality: Right  Implant Details:  Initial Implant Date: 05/27/2017 Last Replacement Date: 02/11/2024 Initial Implanter: Lonni Charleston, MD [id number]; [facility], [place], [place]. Last Implanter: Berda Shelvin A. Como, MD [id number]; [facility], [place], [place]. Delivery Route: Intrathecal Site: Abdominal Laterality:  Right   Additional equipment: TYRX Antibacterial envelope (Minocycline/rifampin)(REF: [id number]  LOT: [id number]  ED: 2024/10/01)   Initial Content:  1ry Medication Class: Preservative-Free Opioid 1ry Medication (Concentration): PF-Morphine  (5.0 mg/mL) 2ry Medication Class: Preservative-Free Local Anesthetic 2ry Medication (Concentration): PF-Bupivacaine  (20.0 mg/mL) 3ry Medication Class: Preservative-Free Adjuvant 3ry Medication (Concentration): PF-Clonidine  (100.0 mcg/mL)  PTM parameters:  Mode: Off/Inactive Bolus Dose: N/A Duration: N/A (min) Max. No.: N/A (bolus/day). Lock-out time (interval): N/A (hrs) Max. Daily bolus dose: N/A (mg/day) Total max. Daily dose (basal + bolus): N/A (mg/day)  Programming:  Type: Simple continuous.  Medication, Concentration, Infusion Program, & Delivery Rate: For up-to-date details please see most recent scanned programming printout.   Changes:  Medication Change: None at this point Rate Change: No change in rate  Reported side-effects or adverse reactions: None reported  Effectiveness: Described as relatively effective, allowing  for increase in activities of daily living (ADL) Clinically meaningful improvement in function (  CMIF): Sustained CMIF goals met  Plan: Pump refill today   Pharmacotherapy Assessment  Analgesic: morphine  sulfate powder (for intrathecal pump use)  MME/day: 2.5 mg/day (Intrathecal)   Monitoring: [place] PMP: PDMP reviewed during this encounter.       Pharmacotherapy: No side-effects or adverse reactions reported. Compliance: No problems identified. Effectiveness: Clinically acceptable. Plan: Refer to POC.  UDS:  Summary  Date Value Ref Range Status  06/17/2023 Note  Final    Comment:    ==================================================================== Compliance Drug Analysis, Ur ==================================================================== Test                             Result       Flag       Units  Drug Present and Declared for Prescription Verification   Gabapentin                      PRESENT      EXPECTED   Venlafaxine                     PRESENT      EXPECTED   Desmethylvenlafaxine           PRESENT      EXPECTED    Desmethylvenlafaxine is an expected metabolite of venlafaxine .    Metoprolol                      PRESENT      EXPECTED  Drug Present not Declared for Prescription Verification   Morphine                        1930         UNEXPECTED ng/mg creat    Potential sources of morphine  include administration of codeine or    morphine , use of heroin, or ingestion of poppy seeds.  Drug Absent but Declared for Prescription Verification   Hydrocodone                     Not Detected UNEXPECTED ng/mg creat   Cyclobenzaprine                 Not Detected UNEXPECTED   Acetaminophen                   Not Detected UNEXPECTED    Acetaminophen , as indicated in the declared medication list, is not    always detected even when used as directed.  ==================================================================== Test                      Result    Flag   Units      Ref  Range   Creatinine              97               mg/dL      >=79 ==================================================================== Declared Medications:  The flagging and interpretation on this report are based on the  following declared medications.  Unexpected results may arise from  inaccuracies in the declared medications.   **Note: The testing scope of this panel includes these medications:   Cyclobenzaprine  (Flexeril )  Gabapentin   Hydrocodone  (Norco)  Metoprolol  (Toprol )  Venlafaxine  (Effexor )   **Note: The testing scope of this panel does not include small to  moderate amounts of these reported medications:   Acetaminophen  (Norco)   **Note: The testing scope  of this panel does not include the  following reported medications:   Albuterol   Apixaban  (Eliquis )  Atorvastatin  (Lipitor)  Buspirone  (Buspar )  Dulaglutide  (Trulicity )  Fluticasone  (Trelegy)  Folic Acid   Furosemide  (Lasix )  Insulin  (Tresiba )  Losartan  (Cozaar )  Metformin   Montelukast   Ondansetron  (Zofran )  Pantoprazole  (Protonix )  Umeclidinium (Trelegy)  Vilanterol (Trelegy)  Vitamin B12  Vitamin D3 ==================================================================== For clinical consultation, please call [phone]. ====================================================================    No results found for: CBDTHCR, D8THCCBX, D9THCCBX  H&P (Pre-op Assessment):  Ms. Shelburne is a 73 y.o. (year old), female patient, seen today for interventional treatment. She  has a past surgical history that includes Abdominal hysterectomy (1987); Elbow arthroscopy with tendon reconstruction; morphine  pump (2011); Plantar fascia release; Esophagogastroduodenoscopy (egd) with propofol  (N/A, 01/01/2017); Colonoscopy with propofol  (N/A, 01/01/2017); LEFT HEART CATH AND CORONARY ANGIOGRAPHY (N/A, 04/28/2017); Mastectomy (Right, 06/1997); Back surgery; Breast surgery (Right); Cardiac catheterization; Trigger  finger release; Cataract extraction w/PHACO (Right, 05/19/2019); Intrathecal pump implant; Coccyx removal; Cataract extraction w/PHACO (Left, 06/16/2019); Colonoscopy with propofol  (N/A, 12/11/2021); Esophagogastroduodenoscopy (egd) with propofol  (N/A, 12/11/2021); Total hip arthroplasty (Left); and Pain pump revision (N/A, 02/11/2024). Ms. Charity has a current medication list which includes the following prescription(s): accu-chek aviva plus, accu-chek softclix lancets, albuterol , albuterol , apixaban , atorvastatin , true metrix meter, buspirone , cholecalciferol , freestyle libre 2 reader, freestyle libre 2 sensor, cyanocobalamin , cyclobenzaprine , dapagliflozin  propanediol, docusate sodium , trulicity , entresto , folic acid , furosemide , gabapentin , insulin  detemir, insulin  lispro, metformin , metoprolol  succinate, montelukast , naloxone , easy air compressor nebulizer, ondansetron , oxygen -helium, PAIN MANAGEMENT INTRATHECAL, IT, PUMP, pantoprazole , trelegy ellipta , tresiba  flextouch, and venlafaxine  xr, and the following Facility-Administered Medications: cyanocobalamin . Her primarily concern today is the Back Pain  Initial Vital Signs:  Pulse/HCG Rate: 90  Temp: 97.6 F (36.4 C) Resp: 20 BP: (!) 120/55 SpO2: 100 % (o2 at 2L [place])  BMI: Estimated body mass index is 29.68 kg/m as calculated from the following:   Height as of this encounter: 4' 10 (1.473 m).   Weight as of this encounter: 142 lb (64.4 kg).  Risk Assessment: Allergies: Reviewed. She is allergic to other, pain patch [menthol], jardiance  [empagliflozin ], prednisone , avelox  [moxifloxacin  hcl in nacl], doxycycline , erythromycin , fentanyl , moxifloxacin  hcl, oxycontin [oxycodone], and ozempic [semaglutide].  Allergy Precautions: None required Coagulopathies: Reviewed. None identified.  Blood-thinner therapy: None at this time Active Infection(s): Reviewed. None identified. Ms. Bankson is afebrile  Site Confirmation: Ms. Hartsfield was asked to  confirm the procedure and laterality before marking the site Procedure checklist: Completed Consent: Before the procedure and under the influence of no sedative(s), amnesic(s), or anxiolytics, the patient was informed of the treatment options, risks and possible complications. To fulfill our ethical and legal obligations, as recommended by the American Medical Association's Code of Ethics, I have informed the patient of my clinical impression; the nature and purpose of the treatment or procedure; the risks, benefits, and possible complications of the intervention; the alternatives, including doing nothing; the risk(s) and benefit(s) of the alternative treatment(s) or procedure(s); and the risk(s) and benefit(s) of doing nothing.  Ms. Bronaugh was provided with information about the general risks and possible complications associated with most interventional procedures. These include, but are not limited to: failure to achieve desired goals, infection, bleeding, organ or nerve damage, allergic reactions, paralysis, and/or death.  In addition, she was informed of those risks and possible complications associated to this particular procedure, which include, but are not limited to: damage to the implant; failure to decrease pain; local, systemic, or serious CNS infections, intraspinal abscess with possible cord  compression and paralysis, or life-threatening such as meningitis; bleeding; organ damage; nerve injury or damage with subsequent sensory, motor, and/or autonomic system dysfunction, resulting in transient or permanent pain, numbness, and/or weakness of one or several areas of the body; allergic reactions, either minor or major life-threatening, such as anaphylactic or anaphylactoid reactions.  Furthermore, Ms. Meneely was informed of those risks and complications associated with the medications. These include, but are not limited to: allergic reactions (i.e.: anaphylactic or anaphylactoid reactions);  endorphine suppression; bradycardia and/or hypotension; water  retention and/or peripheral vascular relaxation leading to lower extremity edema and possible stasis ulcers; respiratory depression and/or shortness of breath; decreased metabolic rate leading to weight gain; swelling or edema; medication-induced neural toxicity; particulate matter embolism and blood vessel occlusion with resultant organ, and/or nervous system infarction; and/or intrathecal granuloma formation with possible spinal cord compression and permanent paralysis.  Before refilling the pump Ms. Sowder was informed that some of the medications used in the devise may not be FDA approved for such use and therefore it constitutes an off-label use of the medications.  Finally, she was informed that Medicine is not an exact science; therefore, there is also the possibility of unforeseen or unpredictable risks and/or possible complications that may result in a catastrophic outcome. The patient indicated having understood very clearly. We have given the patient no guarantees and we have made no promises. Enough time was given to the patient to ask questions, all of which were answered to the patient's satisfaction. Ms. Tipping has indicated that she wanted to continue with the procedure. Attestation: I, the ordering provider, attest that I have discussed with the patient the benefits, risks, side-effects, alternatives, likelihood of achieving goals, and potential problems during recovery for the procedure that I have provided informed consent. Date  Time: 06/21/2024  1:56 PM  Pre-Procedure Preparation:  Monitoring: As per clinic protocol. Respiration, ETCO2, SpO2, BP, heart rate and rhythm monitor placed and checked for adequate function Safety Precautions: Patient was assessed for positional comfort and pressure points before starting the procedure. Time-out: I initiated and conducted the Time-out before starting the procedure, as per  protocol. The patient was asked to participate by confirming the accuracy of the Time Out information. Verification of the correct person, site, and procedure were performed and confirmed by me, the nursing staff, and the patient. Time-out conducted as per Joint Commission's Universal Protocol (UP.01.01.01). Time: 1404 Start Time: 1404 hrs.  Description of Procedure:          Position: Supine Target Area: Central-port of intrathecal pump. Approach: Anterior, 90 degree angle approach. Area Prepped: Entire Area around the pump implant. ChloraPrep (2% chlorhexidine  gluconate and 70% isopropyl alcohol ) Safety Precautions: Aspiration looking for blood return was conducted prior to all injections. At no point did we inject any substances, as a needle was being advanced. No attempts were made at seeking any paresthesias. Safe injection practices and needle disposal techniques used. Medications properly checked for expiration dates. SDV (single dose vial) medications used. Description of the Procedure: Protocol guidelines were followed. Two nurses trained to do implant refills were present during the entire procedure. The refill medication was checked by both healthcare providers as well as the patient. The patient was included in the Time-out to verify the medication. The patient was placed in position. The pump was identified. The area was prepped in the usual manner. The sterile template was positioned over the pump, making sure the side-port location matched that of the pump. Both, the pump and the  template were held for stability. The needle provided in the Medtronic Kit was then introduced thru the center of the template and into the central port. The pump content was aspirated and discarded volume documented. The new medication was slowly infused into the pump, thru the filter, making sure to avoid overpressure of the device. The needle was then removed and the area cleansed, making sure to leave  some of the prepping solution back to take advantage of its long term bactericidal properties. The pump was interrogated and programmed to reflect the correct medication, volume, and dosage. The program was printed and taken to the physician for approval. Once checked and signed by the physician, a copy was provided to the patient and another scanned into the EMR.  Vitals:   06/21/24 1354  BP: (!) 120/55  Pulse: 90  Resp: 20  Temp: 97.6 F (36.4 C)  SpO2: 100%  Weight: 142 lb (64.4 kg)  Height: 4' 10 (1.473 m)    Start Time: 1404 hrs. End Time:   hrs. Materials & Medications: Medtronic Refill Kit Medication(s): Please see chart orders for details.  Type of Imaging Technique: None used Indication(s): N/A Exposure Time: No patient exposure Contrast: None used. Fluoroscopic Guidance: N/A Ultrasound Guidance: N/A Interpretation: N/A  Antibiotic Prophylaxis:   Anti-infectives (From admission, onward)    None      Indication(s): None identified  Post-operative Assessment:  Post-procedure Vital Signs:  Pulse/HCG Rate: 90  Temp: 97.6 F (36.4 C) Resp: 20 BP: (!) 120/55 SpO2: 100 % (o2 at 2L [place])  EBL: None  Complications: No immediate post-treatment complications observed by team, or reported by patient.  Note: The patient tolerated the entire procedure well. A repeat set of vitals were taken after the procedure and the patient was kept under observation following institutional policy, for this type of procedure. Post-procedural neurological assessment was performed, showing return to baseline, prior to discharge. The patient was provided with post-procedure discharge instructions, including a section on how to identify potential problems. Should any problems arise concerning this procedure, the patient was given instructions to immediately contact us , at any time, without hesitation. In any case, we plan to contact the patient by telephone for a follow-up status report  regarding this interventional procedure.  Comments:  No additional relevant information.  Plan of Care (POC)  Orders:  Orders Placed This Encounter  Procedures   PUMP REFILL    Maintain Protocol by having two(2) healthcare providers during procedure and programming.    Scheduling Instructions:     Please refill intrathecal pump today. (06/21/2024)    Where will this procedure be performed?:   [facility]   PUMP REFILL    Whenever possible schedule on a procedure today.    Standing Status:   Future    Expiration Date:   10/19/2024    Scheduling Instructions:     Please schedule intrathecal pump refill based on pump programming. Avoid schedule intervals of more than 120 days (4 months).    Where will this procedure be performed?:   [facility]   Informed Consent Details: Physician/Practitioner Attestation; Transcribe to consent form and obtain patient signature    Transcribe to consent form and obtain patient signature.    Physician/Practitioner attestation of informed consent for procedure/surgical case:   I, the physician/practitioner, attest that I have discussed with the patient the benefits, risks, side effects, alternatives, likelihood of achieving goals and potential problems during recovery for the procedure that I have provided informed  consent.    Procedure:   Intrathecal pump refill    Physician/Practitioner performing the procedure:   Attending Physician: Jeffie Widdowson A. Tanya, MD & designated trained staff    Indication/Reason:   Chronic Pain Syndrome (G89.4), presence of an intrathecal pump (Z97.8)     Analgesic: morphine  sulfate powder (for intrathecal pump use)  MME/day: 2.5 mg/day (Intrathecal)    Medications ordered for procedure: No orders of the defined types were placed in this encounter.  Medications administered: Barnie MICAEL Ku had no medications administered during this visit.  See the medical record for exact dosing, route, and time of  administration.    Interventional Therapies  Risk Factors  Considerations  Medical Comorbidities:  Eliquis  Anticoagulation: (Stop: 3 days  Restart: 6 hours)  A-Fib  Hx of PE  CHF w/ Reduced EF  Cardiomyopathy  Stage 3a CKD  COPD on O2  GERD  T2IDDM  OSA     Planned  Pending:      Under consideration:   Intrathecal pump refill and management.   Completed:   Surgery: Intrathecal pump replacement x1 (02/11/2024) by Eric Tanya, MD [facility]) Surgery: Intrathecal pump implant x1 (05/27/2017) by Lonni Charleston, MD ([facility])   Therapeutic  Palliative (PRN) options:   None established   Completed by other providers:   Surgery: Intrathecal pump replacement (05/27/2017) by Charleston Lonni LABOR, MD ([facility])      Follow-up plan:   Return for Pump Refill (Max:37mo).     Recent Visits Date Type Provider Dept  04/12/24 Procedure visit Tanya Eric, MD [facility]  Showing recent visits within past 90 days and meeting all other requirements Today's Visits Date Type Provider Dept  06/21/24 Procedure visit Tanya Eric, MD [facility]  Showing today's visits and meeting all other requirements Future Appointments Date Type Provider Dept  08/30/24 Appointment Tanya Eric, MD [facility]  Showing future appointments within next 90 days and meeting all other requirements   Disposition: Discharge home  Discharge (Date  Time): 06/21/2024; 1406 hrs.   Primary Care Physician: [name], Jolene T, NP Location: [facility] Note by: Eric LABOR Tanya, MD (TTS technology used. I apologize for any typographical errors that were not detected and corrected.) Date: 06/21/2024; Time: 3:09 PM  Disclaimer:  Medicine is not an visual merchandiser. The only guarantee in medicine is that nothing is guaranteed. It is important to note that the decision to  proceed with this intervention was based on the information collected from the patient. The Data and conclusions were drawn from the patient's questionnaire, the interview, and the physical examination. Because the information was provided in large part by the patient, it cannot be guaranteed that it has not been purposely or unconsciously manipulated. Every effort has been made to obtain as much relevant data as possible for this evaluation. It is important to note that the conclusions that lead to this procedure are derived in large part from the available data. Always take into account that the treatment will also be dependent on availability of resources and existing treatment guidelines, considered by other Pain Management Practitioners as being common knowledge and practice, at the time of the intervention. For Medico-Legal purposes, it is also important to point out that variation in procedural techniques and pharmacological choices are the acceptable norm. The indications, contraindications, technique, and results of the above procedure should only be interpreted and judged by a Board-Certified Interventional Pain Specialist with extensive familiarity and  expertise in the same exact procedure and technique.

## 2024-06-21 NOTE — Patient Instructions (Signed)
 Narcan  available  Opioid Overdose Opioids are drugs that are often used to treat pain. Opioids include illegal drugs, such as heroin, as well as prescription pain medicines, such as codeine, morphine , hydrocodone , and fentanyl . An opioid overdose happens when you take too much of an opioid. An overdose may be intentional or accidental and can happen with any type of opioid. The effects of an overdose can be mild, dangerous, or even deadly. Opioid overdose is a medical emergency. What are the causes? This condition may be caused by: Taking too much of an opioid on purpose. Taking too much of an opioid by accident. Using two or more substances that contain opioids at the same time. Taking an opioid with a substance that affects your heart, breathing, or blood pressure. These include alcohol , tranquilizers, sleeping pills, illegal drugs, and some over-the-counter medicines. This condition may also happen due to an error made by: A health care provider who prescribes a medicine. The pharmacist who fills the prescription. What increases the risk? This condition is more likely in: Children. They may be attracted to colorful pills. Because of a child's small size, even a small amount of a medicine can be dangerous. Older people. They may be taking many different medicines. Older people may have difficulty reading labels or remembering when they last took their medicines. They may also be more sensitive to the effects of opioids. People with chronic medical conditions, especially heart, liver, kidney, or neurological diseases. People who take an opioid for a long period of time. People who take opioids and use illegal drugs, such as heroin, or other substances, such as alcohol . People who: Have a history of drug or alcohol  abuse. Have certain mental health conditions. Have a history of previous drug overdoses. People who take opioids that are not prescribed for them. What are the signs or  symptoms? Symptoms of this condition depend on the type of opioid and the amount that was taken. Common symptoms include: Sleepiness or difficulty waking from sleep. Confusion. Slurred speech. Slowed breathing and a slow pulse (bradycardia). Nausea and vomiting. Abnormally small pupils. Signs and symptoms that require emergency treatment include: Cold, clammy, and pale skin. Blue lips and fingernails. Vomiting. Gurgling sounds in the throat. A pulse that is very slow or difficult to detect. Breathing that is very irregular, slow, noisy, or difficult to detect. Inability to respond to speech or be awakened from sleep (stupor). Seizures. How is this diagnosed? This condition is diagnosed based on your symptoms and medical history. It is important to tell your health care provider: About all of the opioids that you took. When you took the opioids. Whether you were drinking alcohol  or using marijuana, cocaine, or other drugs. Your health care provider will do a physical exam. This exam may include: Checking and monitoring your heart rate and rhythm, breathing rate, temperature, and blood pressure. Measuring oxygen  levels in your blood. Checking for abnormally small pupils. You may also have blood tests or urine tests. You may have X-rays if you are having severe breathing problems. How is this treated? This condition requires immediate medical treatment and hospitalization. Reversing the effects of the opioid is the first step in treatment. If you have a Narcan  kit or naloxone , use it right away. Follow your health care provider's instructions. A friend or family member can also help you with this. The rest of your treatment will be given in the hospital intensive care (ICU). Treatment in the hospital may include: Giving salts and minerals (electrolytes) along  with fluids through an IV. Inserting a breathing tube (endotracheal tube) in your airway to help you breathe if you cannot breathe  on your own or you are in danger of not being able to breathe on your own. Giving oxygen  through a small tube under your nose. Passing a tube through your nose and into your stomach (nasogastric tube, or NG tube) to empty your stomach. Giving medicines that: Increase your blood pressure. Relieve nausea and vomiting. Relieve abdominal pain and cramping. Reverse the effects of the opioid (naloxone ). Monitoring your heart and oxygen  levels. Ongoing counseling and mental health support if you intentionally overdosed or used an illegal drug. Follow these instructions at home:  Medicines Take over-the-counter and prescription medicines only as told by your health care provider. Always ask your health care provider about possible side effects and interactions of any new medicine that you start taking. Keep a list of all the medicines that you take, including over-the-counter medicines. Bring this list with you to all your medical visits. General instructions Drink enough fluid to keep your urine pale yellow. Keep all follow-up visits. This is important. How is this prevented? Read the drug inserts that come with your opioid pain medicines. Take medicines only as told by your health care provider. Do not take more medicine than you are told. Do not take medicines more frequently than you are told. Do not drink alcohol  or take sedatives when taking opioids. Do not use illegal or recreational drugs, including cocaine, ecstasy, and marijuana. Do not take opioid medicines that are not prescribed for you. Store all medicines in safety containers that are out of the reach of children. Get help if you are struggling with: Alcohol  or drug use. Depression or another mental health problem. Thoughts of hurting yourself or another person. Keep the phone number of your local poison control center near your phone or in your mobile phone. In the [place] the hotline of the [facility] is 914 075 7748. If you were prescribed naloxone , make sure you understand how to take it. Contact a health care provider if: You need help understanding how to take your pain medicines. You feel your medicines are too strong. You are concerned that your pain medicines are not working well for your pain. You develop new symptoms or side effects when you are taking medicines. Get help right away if: You or someone else is having symptoms of an opioid overdose. Get help even if you are not sure. You have thoughts about hurting yourself or others. You have: Chest pain. Difficulty breathing. A loss of consciousness. These symptoms may represent a serious problem that is an emergency. Do not wait to see if the symptoms will go away. Get medical help right away. Call your local emergency services (911 in the [place] Do not drive yourself to the hospital. If you ever feel like you may hurt yourself or others, or have thoughts about taking your own life, get help right away. You can go to your nearest emergency department or: Call your local emergency services (911 in the [place] Call a suicide crisis helpline, such as the National Suicide Prevention Lifeline at 763-887-5615 or 988 in the [place] This is open 24 hours a day in the [place] If you're a Veteran: Call 988 and press 1. This is open 24 hours a day. Text the [employer] at 3326403228. Summary Opioids are drugs that are often used to treat pain. Opioids include illegal drugs, such as heroin, as  well as prescription pain medicines. An opioid overdose happens when you take too much of an opioid. Overdoses can be intentional or accidental. Opioid overdose is very dangerous. It is a life-threatening emergency. If you or someone you know is experiencing an opioid overdose, get help right away. This information is not intended to replace advice given to you by your health care provider. Make sure you discuss any questions you have with your health care  provider. Document Revised: 04/27/2023 Document Reviewed: 10/31/2020 Elsevier Patient Education  2024 [employer].

## 2024-06-21 NOTE — Progress Notes (Signed)
 Safety precautions to be maintained throughout the outpatient stay will include: orient to surroundings, keep bed in low position, maintain call bell within reach at all times, provide assistance with transfer out of bed and ambulation.

## 2024-06-22 ENCOUNTER — Telehealth: Payer: Self-pay | Admitting: *Deleted

## 2024-06-22 ENCOUNTER — Encounter: Payer: Self-pay | Admitting: Nurse Practitioner

## 2024-06-22 ENCOUNTER — Ambulatory Visit (INDEPENDENT_AMBULATORY_CARE_PROVIDER_SITE_OTHER): Admitting: Nurse Practitioner

## 2024-06-22 VITALS — BP 108/65 | HR 81 | Temp 98.4°F | Resp 14 | Ht <= 58 in | Wt 145.6 lb

## 2024-06-22 DIAGNOSIS — E119 Type 2 diabetes mellitus without complications: Secondary | ICD-10-CM

## 2024-06-22 DIAGNOSIS — J449 Chronic obstructive pulmonary disease, unspecified: Secondary | ICD-10-CM | POA: Diagnosis not present

## 2024-06-22 DIAGNOSIS — E1169 Type 2 diabetes mellitus with other specified complication: Secondary | ICD-10-CM

## 2024-06-22 DIAGNOSIS — N1831 Chronic kidney disease, stage 3a: Secondary | ICD-10-CM

## 2024-06-22 DIAGNOSIS — E1142 Type 2 diabetes mellitus with diabetic polyneuropathy: Secondary | ICD-10-CM

## 2024-06-22 DIAGNOSIS — R21 Rash and other nonspecific skin eruption: Secondary | ICD-10-CM

## 2024-06-22 DIAGNOSIS — E785 Hyperlipidemia, unspecified: Secondary | ICD-10-CM | POA: Diagnosis not present

## 2024-06-22 DIAGNOSIS — F1120 Opioid dependence, uncomplicated: Secondary | ICD-10-CM

## 2024-06-22 DIAGNOSIS — I152 Hypertension secondary to endocrine disorders: Secondary | ICD-10-CM

## 2024-06-22 DIAGNOSIS — E538 Deficiency of other specified B group vitamins: Secondary | ICD-10-CM | POA: Diagnosis not present

## 2024-06-22 DIAGNOSIS — G894 Chronic pain syndrome: Secondary | ICD-10-CM

## 2024-06-22 DIAGNOSIS — I5022 Chronic systolic (congestive) heart failure: Secondary | ICD-10-CM

## 2024-06-22 DIAGNOSIS — E1159 Type 2 diabetes mellitus with other circulatory complications: Secondary | ICD-10-CM | POA: Diagnosis not present

## 2024-06-22 DIAGNOSIS — G4733 Obstructive sleep apnea (adult) (pediatric): Secondary | ICD-10-CM

## 2024-06-22 DIAGNOSIS — I4819 Other persistent atrial fibrillation: Secondary | ICD-10-CM

## 2024-06-22 DIAGNOSIS — Z794 Long term (current) use of insulin: Secondary | ICD-10-CM | POA: Diagnosis not present

## 2024-06-22 DIAGNOSIS — F324 Major depressive disorder, single episode, in partial remission: Secondary | ICD-10-CM

## 2024-06-22 LAB — BAYER DCA HB A1C WAIVED: HB A1C (BAYER DCA - WAIVED): 6.8 % — ABNORMAL HIGH (ref 4.8–5.6)

## 2024-06-22 MED ORDER — TRIAMCINOLONE ACETONIDE 0.1 % EX CREA: 1.0000 | TOPICAL_CREAM | Freq: Two times a day (BID) | CUTANEOUS | 0 refills | Status: DC

## 2024-06-22 MED ORDER — VALACYCLOVIR HCL 1 G PO TABS: 1000.0000 mg | ORAL_TABLET | Freq: Two times a day (BID) | ORAL | 0 refills | Status: AC

## 2024-06-22 MED FILL — Medication: INTRATHECAL | Qty: 1 | Status: AC

## 2024-06-22 NOTE — Telephone Encounter (Signed)
 Post pump refill;  no questions or concerns.

## 2024-06-22 NOTE — Assessment & Plan Note (Signed)
 Chronic, stable.  Continue current medication regimen and adjust as needed.  Lipid panel today.

## 2024-06-22 NOTE — Progress Notes (Signed)
 BP 108/65 (BP Location: Left Arm, Patient Position: Sitting, Cuff Size: Normal)   Pulse 81   Temp 98.4 F (36.9 C) (Oral)   Resp 14   Ht 4' 9.99 (1.473 m)   Wt 145 lb 9.6 oz (66 kg)   LMP  (LMP Unknown)   SpO2 98%   BMI 30.44 kg/m    Subjective:    [name], female    DOB: 1950/12/26, 73 y.o.   MRN: [id number]  HPI: ARMIYAH CAPRON is a 73 y.o. female  Chief Complaint  Patient presents with   Diabetes    This morning was 215 but didn't get any sleep.    Hypertension    Checking at home 120/58. Highest was 130.    COPD   Congestive Heart Failure   Mood    Doing ok and happy with the news.    Has rash present to right side of neck that gets very itchy and is not going away.  DIABETES A1c August 7.4%. Takes Metformin  1000 MG BID, Trulicity  4.5 MG weekly, Levemir  55 units at HS, Farxiga , and meal time insulin  7 units.  Freestyle 90 day readings show: average glucose 172, In target 60%, Above 40%, Below 0% -- no lows.  History: Jardiance  made her sick in past and Mounjaro  gave her severe diarrhea and abdominal pain. Works with PharmD as needed for assist.   Hypoglycemic episodes: no Polydipsia/polyuria: no Visual disturbance: no Chest pain: no Paresthesias: no Glucose Monitoring: yes  Accucheck frequency: Freestyle as above  Fasting glucose:   Post prandial:   Evening:  Before meals: Taking Insulin ?: yes  Long acting insulin : 55 units  Short acting insulin : 7 units Blood Pressure Monitoring: monthly Retinal Examination: Up To Date -- [facility] Foot Exam: Up to Date Pneumovax: Up to Date Influenza: Up to Date Aspirin : no   HYPERTENSION / HYPERLIPIDEMIA/HF Takes Atorvastatin , Metoprolol , Lasix , Entresto , Eliquis , + ASA. Had visit with cardiology was on 04/07/24  and HF Clinic on 04/01/24.  No recent changes.   Echo 04/07/24 showed EF 45-50%, which was improvement from previous. Satisfied with current treatment? yes Duration of  hypertension: chronic BP monitoring frequency: a few times a week BP range: on average <130/80 BP medication side effects: no:  Duration of hyperlipidemia: chronic Cholesterol medication side effects: no Cholesterol supplements: none Medication compliance: good compliance Aspirin : no Recent stressors: no Recurrent headaches: no Visual changes: no Palpitations: no Dyspnea: no Chest pain: no Lower extremity edema: no Dizzy/lightheaded: no   ATRIAL FIBRILLATION Atrial fibrillation status: stable Satisfied with current treatment: yes  Medication side effects:  no Medication compliance: good compliance Etiology of atrial fibrillation: unknown Palpitations:  no Chest pain:  no Dyspnea on exertion: no Orthopnea:  no Syncope:  no Edema:  no Ventricular rate control: B-blocker Anti-coagulation: long acting   COPD Using Trelegy, Albuterol , and Singulair .  Dr. Theotis with pulmonary last seen on 06/15/24.   Has a history of smoking, quit 15 years ago. No CPAP, but continues to use O2 at night 2 L.  Sleeps in recliner at baseline, has since 1998.  COPD status: stable Satisfied with current treatment?: yes Oxygen  use: yes 2L -- does not use while at home until night time Dyspnea frequency: no Cough frequency: no  Rescue inhaler frequency: rarely Limitation of activity: no Productive cough: no Last Spirometry: with pulmonary Pneumovax: Up to Date Influenza: Up to Date   CHRONIC KIDNEY DISEASE (Stage 3a)  CrCl 44 CKD status: stable  Medications renally dose: yes Previous renal evaluation: no Pneumovax:  Up to Date Influenza Vaccine:  Up to Date   DEPRESSION Takes Effexor  150 MG daily + Buspar  5 MG BID.  Pain clinic last seen on 06/21/24 to fill pump, with new pain pump placed on 02/11/24. Mood status: stable Satisfied with current treatment?: yes Symptom severity: moderate  Duration of current treatment : chronic Side effects: no Medication compliance: good  compliance Psychotherapy/counseling: yes in the past Depressed mood: a little bit on occasion Anxious mood: a little bit Anhedonia: no Significant weight loss or gain: no Insomnia: yes poor sleep, chronic issue since her dad passed many years ago Fatigue: no Feelings of worthlessness or guilt: no Impaired concentration/indecisiveness: no Suicidal ideations: no Hopelessness: no Crying spells: just once with recent mammogram scare    06/22/2024    9:05 AM 06/21/2024    1:59 PM 05/19/2024    1:57 PM 04/27/2024   10:16 AM 04/12/2024    1:48 PM  Depression screen PHQ 2/9  Decreased Interest 0 0 0 0 0  Down, Depressed, Hopeless 1 0 0 0 0  PHQ - 2 Score 1 0 0 0 0  Altered sleeping 3      Tired, decreased energy 2      Change in appetite 0      Feeling bad or failure about yourself  0      Trouble concentrating 0      Moving slowly or fidgety/restless 0      Suicidal thoughts 0      PHQ-9 Score 6      Difficult doing work/chores Not difficult at all           06/22/2024    9:05 AM 03/21/2024    9:27 AM 12/18/2023   10:02 AM 11/17/2023    4:22 PM  GAD 7 : Generalized Anxiety Score  Nervous, Anxious, on Edge 1 0 1 1  Control/stop worrying 0 0 0 0  Worry too much - different things 0 0 0 0  Trouble relaxing 1 0 0 1  Restless 0 0 0 0  Easily annoyed or irritable 0 1 0 0  Afraid - awful might happen 0 0 0 0  Total GAD 7 Score 2 1 1 2   Anxiety Difficulty  Not difficult at all Not difficult at all Not difficult at all   Relevant past medical, surgical, family and social history reviewed and updated as indicated. Interim medical history since our last visit reviewed. Allergies and medications reviewed and updated.  Review of Systems  Constitutional:  Negative for activity change, appetite change, diaphoresis, fatigue and fever.  Respiratory:  Negative for cough, chest tightness and shortness of breath.   Cardiovascular:  Negative for chest pain, palpitations and leg swelling.   Gastrointestinal: Negative.   Endocrine: Negative for heat intolerance, polydipsia, polyphagia and polyuria.  Neurological: Negative.   Psychiatric/Behavioral: Negative.     Per HPI unless specifically indicated above     Objective:    BP 108/65 (BP Location: Left Arm, Patient Position: Sitting, Cuff Size: Normal)   Pulse 81   Temp 98.4 F (36.9 C) (Oral)   Resp 14   Ht 4' 9.99 (1.473 m)   Wt 145 lb 9.6 oz (66 kg)   LMP  (LMP Unknown)   SpO2 98%   BMI 30.44 kg/m   Wt Readings from Last 3 Encounters:  06/22/24 145 lb 9.6 oz (66 kg)  06/21/24 142 lb (64.4 kg)  04/27/24 145  lb 4.8 oz (65.9 kg)    Physical Exam Vitals and nursing note reviewed.  Constitutional:      General: She is awake. She is not in acute distress.    Appearance: Normal appearance. She is well-developed and well-groomed. She is not ill-appearing or toxic-appearing.  HENT:     Head: Normocephalic.     Right Ear: Hearing and external ear normal.     Left Ear: Hearing and external ear normal.  Eyes:     General: Lids are normal.        Right eye: No discharge.        Left eye: No discharge.     Conjunctiva/sclera: Conjunctivae normal.     Pupils: Pupils are equal, round, and reactive to light.  Neck:     Thyroid : No thyromegaly.     Vascular: No carotid bruit.     Comments: Kyphosis present back. Cardiovascular:     Rate and Rhythm: Normal rate and regular rhythm.     Heart sounds: Normal heart sounds. No murmur heard.    No gallop.  Pulmonary:     Effort: Pulmonary effort is normal. No accessory muscle usage or respiratory distress.     Breath sounds: Normal breath sounds. No decreased breath sounds, wheezing or rales.     Comments: Oxygen  by [place] 2 L. Abdominal:     General: Bowel sounds are normal. There is no distension.     Palpations: Abdomen is soft.     Tenderness: There is no abdominal tenderness.  Musculoskeletal:     Cervical back: Normal range of motion and neck supple. Torticollis  present.     Right lower leg: No edema.     Left lower leg: No edema.  Lymphadenopathy:     Cervical: No cervical adenopathy.  Skin:    General: Skin is warm and dry.  Neurological:     Mental Status: She is alert and oriented to person, place, and time.     Deep Tendon Reflexes: Reflexes are normal and symmetric.     Reflex Scores:      Brachioradialis reflexes are 2+ on the right side and 2+ on the left side.      Patellar reflexes are 2+ on the right side and 2+ on the left side. Psychiatric:        Attention and Perception: Attention normal.        Mood and Affect: Mood normal.        Speech: Speech normal.        Behavior: Behavior normal. Behavior is cooperative.        Thought Content: Thought content normal.    Results for orders placed or performed in visit on 05/25/24  ECHOCARDIOGRAM COMPLETE   Collection Time: 05/25/24 10:55 AM  Result Value Ref Range   AR max vel 1.40 cm2   AV Peak grad 8.3 mmHg   Ao pk vel 1.44 m/s   S' Lateral 3.73 cm   Area-P 1/2 4.49 cm2   AV Area VTI 1.24 cm2   AV Mean grad 5.0 mmHg   AV Area mean vel 1.34 cm2   Est EF 45 - 50%    *Note: Due to a large number of results and/or encounters for the requested time period, some results have not been displayed. A complete set of results can be found in Results Review.      Assessment & Plan:   Problem List Items Addressed This Visit  Cardiovascular and Mediastinum   Chronic HFrEF (heart failure with reduced ejection fraction) (HCC) (Chronic)   Chronic, ongoing with most recent EF 04/07/24 of 45-50% which was improvement, followed by cardiology and HF Clinic.  Euvolemic today.  Continue current medication regimen and collaboration with cardiology.  Recommend: - Reminded to call for an overnight weight gain of >2 pounds or a weekly weight gain of >5 pounds - not adding salt to food and read food labels. Reviewed the importance of keeping daily sodium intake to 2000mg  daily - Avoid  Ibuprofen products      Persistent atrial fibrillation (HCC)   Chronic, stable.  Rate well controlled.  Continue current medications and collaboration with cardiology.      Hypertension associated with diabetes (HCC)   Chronic, stable.  BP well below goal in office today.  Recommend she continue to monitor BP at home regularly.  Focus on DASH diet.  Continue current medication regimen and adjust as needed, refills sent as needed.  LABS: CMP.  Urine ALB 80 February 2025.  Return in 3 months.       Relevant Orders   Bayer DCA Hb A1c Waived     Respiratory   COPD, moderate (HCC) (Chronic)   Chronic, stable. Rare use of Albuterol . Continue Trelegy which offers benefit of medication minimization and has benefited symptoms.  Continue to collaborate with Dr. Theotis, recent notes reviewed.  Continue O2 use as ordered.        Endocrine   Diabetic peripheral neuropathy (HCC) (Chronic)   Chronic, ongoing with last A1c 6.8% today, trend down.  Urine ALB 80 February 2025. Continue current regimen and adjust as needed.  Did not tolerate Jardiance  in past + Mounjaro  made her sick.  Is tolerating Farxiga .  She monitors sugar levels frequently throughout daytime hours. - Continue pain pump and Gabapentin  as ordered, will need to renal dose this to max total daily dose of 900 MG next visit - Foot exam up to date.  Eye exam up to date. - ARB and statin on board. - Will continue Metformin  at max dosing, Trulicity  4.5 MG weekly, Farxiga , and Levemir  55 units, increase 3 units in 3 days if fasting sugar not less then 130 consistently recommended.  Continue meal time insulin , 7 units prior to meals and will increase as needed.  Educated her on this.   - Freestyle use continues, to bring to all visits. - Recommend she continue to monitor BS consistently at home and document + focus heavily on diet changes.  She is aware to notify provider if fasting BS >130 consistently or <70, as may need to adjust insulin   further.        Relevant Orders   Bayer DCA Hb A1c Waived   Insulin  dependent type 2 diabetes mellitus (HCC) - Primary   Chronic, ongoing with last A1c 6.8% today, trend down.  Urine ALB 80 February 2025. Continue current regimen and adjust as needed.  Did not tolerate Jardiance  in past + Mounjaro  made her sick.  Is tolerating Farxiga .  She monitors sugar levels frequently throughout daytime hours. - Vaccines up to date - Foot exam up to date.  Eye exam up to date. - ARB and statin on board. - Will continue Metformin  at max dosing, Trulicity  4.5 MG weekly, Farxiga , and Levemir  55 units, increase 3 units in 3 days if fasting sugar not less then 130 consistently recommended.  Continue meal time insulin , 7 units prior to meals and will increase as needed.  Educated her on this.   - Freestyle use continues, to bring to all visits. - Recommend she continue to monitor BS consistently at home and document + focus heavily on diet changes.  She is aware to notify provider if fasting BS >130 consistently or <70, as may need to adjust insulin  further.          Relevant Orders   Bayer DCA Hb A1c Waived   Hyperlipidemia associated with type 2 diabetes mellitus (HCC)   Chronic, stable.  Continue current medication regimen and adjust as needed.  Lipid panel today.      Relevant Orders   Bayer DCA Hb A1c Waived   Lipid Panel w/o Chol/HDL Ratio     Musculoskeletal and Integument   Rash   To right side of neck, concern for shingles based on appearance. Will start renal dosed Valtrex 1000 MG BID (current CrCl 44).  Triamcinolone  cream sent to apply to area as needed for itchiness.  Recommend she monitor area closely and if any worsening to return to office ASAP.        Genitourinary   CKD stage 3a, GFR 45-59 ml/min (HCC) (Chronic)   Ongoing and stable recent labs.  Continue Losartan  at low dose for heart and kidney protection + Farxiga .  Labs up to date.  Will refer to nephrology if any worsening in  future.  May need to reduce Metformin  if eGFR consistently <45, check today. CrCl 44 at present will need to renal dose Gabapentin  next visit to max daily dose of 900 MG if continues to be at this level.      Relevant Orders   Comprehensive metabolic panel with GFR     Other   Chronic pain syndrome (Chronic)   Chronic, stable.  Continue to collaborate with chronic pain management locally, recent notes reviewed.       Opiate dependence, continuous (HCC)   Chronic, followed by pain management, will continue this collaboration.  Recent notes reviewed.      Obesity (BMI 30-39.9)   BMI 30.44.  Recommended eating smaller high protein, low fat meals more frequently and exercising 30 mins a day 5 times a week with a goal of 10-15lb weight loss in the next 3 months. Patient voiced their understanding and motivation to adhere to these recommendations.       Depression, major, single episode, in partial remission   Chronic, ongoing.  Denies SI/HI.  Mood well-controlled at this time.  Continue current medication regimen and adjust as needed.           Follow up plan: Return in about 3 months (around 09/22/2024) for T2DM, HTN/HLD, Depression, COPD.

## 2024-06-22 NOTE — Assessment & Plan Note (Signed)
 Chronic, stable.  Rate well controlled.  Continue current medications and collaboration with cardiology.

## 2024-06-22 NOTE — Assessment & Plan Note (Addendum)
 Chronic, ongoing with last A1c 6.8% today, trend down.  Urine ALB 80 February 2025. Continue current regimen and adjust as needed.  Did not tolerate Jardiance  in past + Mounjaro  made her sick.  Is tolerating Farxiga .  She monitors sugar levels frequently throughout daytime hours. - Continue pain pump and Gabapentin  as ordered, will need to renal dose this to max total daily dose of 900 MG next visit - Foot exam up to date.  Eye exam up to date. - ARB and statin on board. - Will continue Metformin  at max dosing, Trulicity  4.5 MG weekly, Farxiga , and Levemir  55 units, increase 3 units in 3 days if fasting sugar not less then 130 consistently recommended.  Continue meal time insulin , 7 units prior to meals and will increase as needed.  Educated her on this.   - Freestyle use continues, to bring to all visits. - Recommend she continue to monitor BS consistently at home and document + focus heavily on diet changes.  She is aware to notify provider if fasting BS >130 consistently or <70, as may need to adjust insulin  further.

## 2024-06-22 NOTE — Assessment & Plan Note (Signed)
 Chronic, stable. Rare use of Albuterol. Continue Trelegy which offers benefit of medication minimization and has benefited symptoms.  Continue to collaborate with Dr. Meredeth Ide, recent notes reviewed.  Continue O2 use as ordered.

## 2024-06-22 NOTE — Assessment & Plan Note (Signed)
 Chronic, stable.  Continue to collaborate with chronic pain management locally, recent notes reviewed.

## 2024-06-22 NOTE — Assessment & Plan Note (Addendum)
 Chronic, stable.  BP well below goal in office today.  Recommend she continue to monitor BP at home regularly.  Focus on DASH diet.  Continue current medication regimen and adjust as needed, refills sent as needed.  LABS: CMP.  Urine ALB 80 February 2025.  Return in 3 months.

## 2024-06-22 NOTE — Assessment & Plan Note (Signed)
 To right side of neck, concern for shingles based on appearance. Will start renal dosed Valtrex  1000 MG BID (current CrCl 44).  Triamcinolone  cream sent to apply to area as needed for itchiness.  Recommend she monitor area closely and if any worsening to return to office ASAP.

## 2024-06-22 NOTE — Assessment & Plan Note (Signed)
 Chronic, ongoing with most recent EF 04/07/24 of 45-50% which was improvement, followed by cardiology and [facility] Clinic.  Euvolemic today.  Continue current medication regimen and collaboration with cardiology.  Recommend: - Reminded to call for an overnight weight gain of >2 pounds or a weekly weight gain of >5 pounds - not adding salt to food and read food labels. Reviewed the importance of keeping daily sodium intake to 2000mg  daily - Avoid Ibuprofen products

## 2024-06-22 NOTE — Assessment & Plan Note (Signed)
 BMI 30.44.  Recommended eating smaller high protein, low fat meals more frequently and exercising 30 mins a day 5 times a week with a goal of 10-15lb weight loss in the next 3 months. Patient voiced their understanding and motivation to adhere to these recommendations.

## 2024-06-22 NOTE — Assessment & Plan Note (Signed)
 Chronic, followed by pain management, will continue this collaboration.  Recent notes reviewed.

## 2024-06-22 NOTE — Assessment & Plan Note (Signed)
 Chronic, ongoing.  Denies SI/HI.  Mood well-controlled at this time.  Continue current medication regimen and adjust as needed.

## 2024-06-22 NOTE — Assessment & Plan Note (Signed)
 Ongoing and stable recent labs.  Continue Losartan  at low dose for heart and kidney protection + Farxiga .  Labs up to date.  Will refer to nephrology if any worsening in future.  May need to reduce Metformin  if eGFR consistently <45, check today. CrCl 44 at present will need to renal dose Gabapentin  next visit to max daily dose of 900 MG if continues to be at this level.

## 2024-06-22 NOTE — Assessment & Plan Note (Signed)
 Chronic, ongoing with last A1c 6.8% today, trend down.  Urine ALB 80 February 2025. Continue current regimen and adjust as needed.  Did not tolerate Jardiance  in past + Mounjaro  made her sick.  Is tolerating Farxiga .  She monitors sugar levels frequently throughout daytime hours. - Vaccines up to date - Foot exam up to date.  Eye exam up to date. - ARB and statin on board. - Will continue Metformin  at max dosing, Trulicity  4.5 MG weekly, Farxiga , and Levemir  55 units, increase 3 units in 3 days if fasting sugar not less then 130 consistently recommended.  Continue meal time insulin , 7 units prior to meals and will increase as needed.  Educated her on this.   - Freestyle use continues, to bring to all visits. - Recommend she continue to monitor BS consistently at home and document + focus heavily on diet changes.  She is aware to notify provider if fasting BS >130 consistently or <70, as may need to adjust insulin  further.

## 2024-06-23 ENCOUNTER — Ambulatory Visit: Payer: Self-pay | Admitting: Nurse Practitioner

## 2024-06-23 ENCOUNTER — Ambulatory Visit

## 2024-06-23 DIAGNOSIS — E875 Hyperkalemia: Secondary | ICD-10-CM

## 2024-06-23 LAB — COMPREHENSIVE METABOLIC PANEL WITH GFR
ALT: 10 IU/L (ref 0–32)
AST: 13 IU/L (ref 0–40)
Albumin: 4.4 g/dL (ref 3.8–4.8)
Alkaline Phosphatase: 77 IU/L (ref 49–135)
BUN/Creatinine Ratio: 20 (ref 12–28)
BUN: 24 mg/dL (ref 8–27)
Bilirubin Total: 0.2 mg/dL (ref 0.0–1.2)
CO2: 27 mmol/L (ref 20–29)
Calcium: 9.7 mg/dL (ref 8.7–10.3)
Chloride: 97 mmol/L (ref 96–106)
Creatinine, Ser: 1.22 mg/dL — ABNORMAL HIGH (ref 0.57–1.00)
Globulin, Total: 3.3 g/dL (ref 1.5–4.5)
Glucose: 183 mg/dL — ABNORMAL HIGH (ref 70–99)
Potassium: 5.4 mmol/L — ABNORMAL HIGH (ref 3.5–5.2)
Sodium: 140 mmol/L (ref 134–144)
Total Protein: 7.7 g/dL (ref 6.0–8.5)
eGFR: 47 mL/min/1.73 — ABNORMAL LOW (ref >59)

## 2024-06-23 LAB — LIPID PANEL W/O CHOL/HDL RATIO
Cholesterol, Total: 126 mg/dL (ref 100–199)
HDL: 66 mg/dL (ref >39)
LDL Chol Calc (NIH): 44 mg/dL (ref 0–99)
Triglycerides: 82 mg/dL (ref 0–149)
VLDL Cholesterol Cal: 16 mg/dL (ref 5–40)

## 2024-06-23 NOTE — Progress Notes (Signed)
 Contacted via MyChart - lab only visit in 2 weeks please  Good afternoon Dyana, your labs have returned: - Kidney function continues to show chronic kidney disease Stage 3a to 3b ranges. We will continue to monitor at visits closely and send to kidney doctor as needed. Ensure water  intake is good at home and avoid Ibuprofen products. - Potassium is a little elevated, I would like to recheck this outpatient in 2 weeks. Please lower any potassium rich foods in diet like bananas, mangoes, dried fruit, nuts, orange juice, potatoes.  - Lipid panel overall stable with levels well at goal. Any questions? Keep being amazing!!  Thank you for allowing me to participate in your care.  I appreciate you. Kindest regards, Isabellah Sobocinski

## 2024-06-24 ENCOUNTER — Other Ambulatory Visit: Payer: Self-pay

## 2024-06-24 NOTE — Patient Outreach (Unsigned)
 Complex Care Management   Visit Note  06/24/2024  Name:  Jordan Chan MRN: [id number] DOB: 1951-04-21  Situation: Referral received for Complex Care Management related to {Criteria:32550} I obtained verbal consent from {CHL AMB Patient/Caregiver:28184}.  Visit completed with {CHL AMB Patient/Caregiver:28184}  {VISIT LOCATION:32553}  Background:   Past Medical History:  Diagnosis Date   (HFpEF) heart failure with preserved ejection fraction (HCC)    Anemia    Anxiety    Aortic atherosclerosis    Arthritis    Asthma    Bilateral pulmonary embolism (small subsegmental) 08/11/2023   Breast cancer, right (HCC) 1998   a.) Tx'd with surgical excision + chemotherapy + XRT   Bronchitis    Cerebral microvascular disease    Chronic pain syndrome    CKD (chronic kidney disease), stage III (HCC)    COPD (chronic obstructive pulmonary disease) (HCC)    DDD (degenerative disc disease), cervical    Decreased ROM of intervertebral discs of cervical spine    Dependence on nocturnal oxygen  therapy (2L/[facility])    Depression    Diabetes mellitus type 2, insulin  dependent (HCC)    Diverticulitis    Dyspnea    Endometriosis    GERD (gastroesophageal reflux disease)    History of shingles    Hypercholesteremia    Hypertension    IBS (irritable bowel syndrome)    Low back pain    a.) has intrathecal pump (morphine /bupivicaine/clonidine )   Neuropathy    On apixaban  therapy    Opioid withdrawal (HCC)    a.) experiences withdrawal symptoms when batteries in intrathecal pump reach EOL   Orthopnea    PAF (paroxysmal atrial fibrillation) (HCC)    a.) CHA2DS2VASc = 8 (age, sex, HFpEF, HTN, TIA/PE x 2, vascular disease, T2DM) as of metoprolol  succinate; b.) rate/rhythm maintained on oral metoprolol  succinate; chronically anticoagulated with apixaban    Personal history of chemotherapy    Personal history of radiation therapy    Pneumonia    Scoliosis    Sleep apnea    a.) no nocturnal PAP  therapy; requires supplemental oxygen  at bedtime   Status post bilateral cataract extraction 2020   Takotsubo cardiomyopathy    TIA (transient ischemic attack) 2010    Assessment: Patient Reported Symptoms:  Cognitive        Neurological      HEENT        Cardiovascular      Respiratory      Endocrine      Gastrointestinal        Genitourinary      Integumentary      Musculoskeletal          Psychosocial            06/24/2024    PHQ2-9 Depression Screening   Little interest or pleasure in doing things    Feeling down, depressed, or hopeless    PHQ-2 - Total Score    Trouble falling or staying asleep, or sleeping too much    Feeling tired or having little energy    Poor appetite or overeating     Feeling bad about yourself - or that you are a failure or have let yourself or your family down    Trouble concentrating on things, such as reading the newspaper or watching television    Moving or speaking so slowly that other people could have noticed.  Or the opposite - being so fidgety or restless that you have been moving around a lot more than  usual    Thoughts that you would be better off dead, or hurting yourself in some way    PHQ2-9 Total Score    If you checked off any problems, how difficult have these problems made it for you to do your work, take care of things at home, or get along with other people    Depression Interventions/Treatment      There were no vitals filed for this visit.    Medications Reviewed Today   Medications were not reviewed in this encounter     Recommendation:   {RECOMMENDATONS:32554}  Follow Up Plan:   {FOLLOWUP:32559}  SIG ***

## 2024-06-27 NOTE — Patient Instructions (Signed)
 Thank you for allowing the Complex Care Management team to participate in your care. It was great speaking with you.  Congratulations on meeting your care management goals. Keep up the great work managing your care!  Please do not hesitate to notify your primary care provider if your health needs change and you require additional outreach. The care team will gladly assist.   [facility] RN Care Manager Direct Dial : [phone]  Fax: [phone] Website: [email or web address]

## 2024-06-27 NOTE — Progress Notes (Signed)
 Scheduled

## 2024-06-28 ENCOUNTER — Telehealth: Payer: Self-pay

## 2024-06-28 NOTE — Progress Notes (Signed)
   06/28/2024  [name], female   DOB: [birth year], 73 y.o.   MRN: [id number]  This patient is appearing on a report for being at risk of failing the adherence measure for cholesterol (statin) medications this calendar year.   Medication: atorvastatin  40mg  Last fill date: 06/19/24 for 90 day supply  Insurance report was not up to date. No action needed at this time.   Jordan Chan, PharmD, DPLA

## 2024-06-29 ENCOUNTER — Telehealth: Payer: Self-pay

## 2024-06-29 NOTE — Telephone Encounter (Signed)
 Copied from CRM #[id number]. Topic: Clinical - Prescription Issue >> Jun 29, 2024  3:11 PM Larissa S wrote: Reason for CRM: Patient states when she picked up her Continuous Glucose Sensor (FREESTYLE LIBRE 2 SENSOR) MISC from pharmacy she only received one sensor. She states she normally receives two and is requesting to have prescription sent to her pharmacy so that she can get her second sensor and ask that going forward prescription allows her to receive at least two sensors.   [facility] [address] [place] - [address] [place] [address] Phone: [phone] Fax: [phone] Hours: Not open 24 hours

## 2024-07-04 NOTE — Telephone Encounter (Signed)
 Per pharmacy the patient used the automated system this month and last. When it filled last month it did just 1 and continued that again this month. When placing next month order patient is advised to speak with them directly rather than use the automated system and this should correct it going forward.  Spoke with patient and provided the above update.

## 2024-07-07 ENCOUNTER — Other Ambulatory Visit

## 2024-07-07 DIAGNOSIS — E875 Hyperkalemia: Secondary | ICD-10-CM

## 2024-07-08 ENCOUNTER — Ambulatory Visit: Payer: Self-pay | Admitting: Nurse Practitioner

## 2024-07-08 LAB — POTASSIUM: Potassium: 4.8 mmol/L (ref 3.5–5.2)

## 2024-07-08 NOTE — Progress Notes (Signed)
 Contacted via MyChart  Potassium level now normal. Great news!!

## 2024-07-10 ENCOUNTER — Other Ambulatory Visit: Payer: Self-pay | Admitting: Nurse Practitioner

## 2024-07-11 NOTE — Progress Notes (Unsigned)
 Cardiology Office Note    Date:  07/12/2024   ID:  Jordan Chan, DOB 08/22/1950, MRN [id number]  PCP:  Jordan Melanie DASEN, NP  Cardiologist:  Jordan Hanson, MD  Electrophysiologist:  None   Chief Complaint: Follow-up  History of Present Illness:   Jordan Chan is a 73 y.o. female with history of nonobstructive CAD by LHC in 2018, stress-induced cardiomyopathy in 2018 in the setting of pain pump malfunction and opioid withdrawal with subsequent improvement in LV systolic function with recurrent cardiomyopathy by echo in 08/2023 now with HFmrEF, Afib diagnosed in 09/2021 on apixaban , PE following hospitalization for pneumonia in the summer 2021 treated with apixaban  with recurrent PE in 08/2023 occurring while on and adherent to apixaban , chronic low back pain, DM, COPD, CKD stage II-III, sleep apnea intolerant to CPAP on nocturnal oxygen , IBS, MGUS, and GERD who presents for follow-up of CAD and cardiomyopathy.  Echo in 04/2017 showed an EF of 25-30%, severe hypokinesis of the mid and apical myocardium suggestive of stress-induced cardiomyopathy, mild left atrial enlargement, and a poorly visualized RV that was grossly normal in size.  LHC at that time showed no angiographically significant CAD with a severely reduced LVEF estimated at 25 to 30% with mid and apical akinesis consistent with stress-induced cardiomyopathy.  Repeat echo in 05/2017, through [facility], showed an improving LV systolic function with an EF of 45 to 50%, global hypokinesis, normal LV size and wall thickness, grade 1 diastolic dysfunction, and normal RV systolic function and ventricular cavity size.  Limited echo in 08/2017 showed a low normal LV systolic function with an EF of 50 to 55%.  Echo in 07/2020 showed an EF of 45 to 50%, global hypokinesis, grade 2 diastolic dysfunction, normal RV systolic function and ventricular cavity size, no pericardial effusion, no significant valvular abnormalities, and a normal  CVP.  Echo from 12/2020, performed in the context of feeling short of breath and fatigued showed an EF of 45% with global hypokinesis, grade 2 diastolic dysfunction, normal RV systolic function and ventricular cavity size, no pericardial effusion, and no significant valvular abnormalities.  She was admitted to the hospital in 09/2021 with acute on chronic hypoxic respiratory failure and septic shock with admission notable for new onset A-fib with RVR.  She converted on amiodarone  drip and was placed on apixaban .  Echo in 09/2021 showed an EF of 25 to 30%, global hypokinesis, grade 2 diastolic dysfunction, moderately reduced RV systolic function, and no significant valvular abnormality.  Limited echo in 10/2021 showed an EF of 40 to 45%, no regional wall motion abnormalities, grade 1 diastolic dysfunction, normal RV systolic function and ventricular cavity size, no significant valvular abnormality, and an estimated right atrial pressure of 3 mmHg.  Lexiscan  MPI from 11/2021 showed no significant ischemia with an EF of 67% and was overall low risk.  Echo from 10/2022 showed an EF of 45 to 50%, global hypokinesis, normal RV systolic function and ventricular cavity size, no significant valvular abnormality, and an estimated right atrial pressure of 3 mmHg.     She was admitted in 08/2023 with acute on chronic hypoxic and hypercapnic respiratory failure secondary to multiple subsegmental PE and aspiration pneumonia.  She was transitioned from apixaban  to heparin  drip with initiation of apixaban  at time of discharge.  Echo from 08/2023 showed recurrent cardiomyopathy with an EF of 25 to 30%, global hypokinesis, grade 1 diastolic dysfunction, low normal RV systolic function with normal ventricular cavity size, and trivial mitral  regurgitation.  Troponin 20 with a delta troponin 95.   Throughout 2025, she has been largely followed by the [facility] with escalation of GDMT as able.  She was last seen in our office in  04/2024 and was doing well from a cardiac perspective with stable chronic dyspnea.  She was on supplemental oxygen  during the day, particularly with exertion.  She reported adherence to apixaban  taking this twice daily every day leading up to her admission in 08/2023.  In this setting, she was referred to hematology with recommendation to continue anticoagulation indefinitely.  Echo in 05/2024 showed an EF of 45 to 50%, global hypokinesis, grade 1 diastolic dysfunction, normal RV systolic function and ventricular cavity size, mild mitral regurgitation, and a normal CVP.  She comes in doing well from a cardiac perspective and is without symptoms of frank chest pain.  She does continue to note chronic longstanding dyspnea and is currently using supplemental oxygen  via nasal cannula at 2 L.  At home, at rest she typically does not need supplemental oxygen .  No lower extremity swelling.  At baseline, sleeps in a recliner due to neuropathic pain following prior mastectomy.  No falls or symptoms concerning for bleeding.  Adherent to anticoagulation.  Weight is up 6 pounds today when compared to visit in 04/2024.   Labs independently reviewed: 07/2024 - potassium 4.8 06/2024 - TC 126, TG 82, HDL 66, LDL 44, BUN 24, serum creatinine 1.22, albumin 4.4, AST/ALT normal, A1c 6.8 02/2024 - Hgb 11.8, PLT 439 12/2023 - TSH normal   Past Medical History:  Diagnosis Date   (HFpEF) heart failure with preserved ejection fraction (HCC)    Anemia    Anxiety    Aortic atherosclerosis    Arthritis    Asthma    Bilateral pulmonary embolism (small subsegmental) 08/11/2023   Breast cancer, right (HCC) 1998   a.) Tx'd with surgical excision + chemotherapy + XRT   Bronchitis    Cerebral microvascular disease    Chronic pain syndrome    CKD (chronic kidney disease), stage III (HCC)    COPD (chronic obstructive pulmonary disease) (HCC)    DDD (degenerative disc disease), cervical    Decreased ROM of intervertebral discs of  cervical spine    Dependence on nocturnal oxygen  therapy (2L/[place])    Depression    Diabetes mellitus type 2, insulin  dependent (HCC)    Diverticulitis    Dyspnea    Endometriosis    GERD (gastroesophageal reflux disease)    History of shingles    Hypercholesteremia    Hypertension    IBS (irritable bowel syndrome)    Low back pain    a.) has intrathecal pump (morphine /bupivicaine/clonidine )   Neuropathy    On apixaban  therapy    Opioid withdrawal (HCC)    a.) experiences withdrawal symptoms when batteries in intrathecal pump reach EOL   Orthopnea    PAF (paroxysmal atrial fibrillation) (HCC)    a.) CHA2DS2VASc = 8 (age, sex, HFpEF, HTN, TIA/PE x 2, vascular disease, T2DM) as of metoprolol  succinate; b.) rate/rhythm maintained on oral metoprolol  succinate; chronically anticoagulated with apixaban    Personal history of chemotherapy    Personal history of radiation therapy    Pneumonia    Scoliosis    Sleep apnea    a.) no nocturnal PAP therapy; requires supplemental oxygen  at bedtime   Status post bilateral cataract extraction 2020   Takotsubo cardiomyopathy    TIA (transient ischemic attack) 2010    Past Surgical History:  Procedure Laterality Date   ABDOMINAL HYSTERECTOMY  1987   BACK SURGERY     Tailbone removed following fracture   BREAST SURGERY Right    mastectomy   CARDIAC CATHETERIZATION     CATARACT EXTRACTION W/PHACO Right 05/19/2019   Procedure: CATARACT EXTRACTION PHACO AND INTRAOCULAR LENS PLACEMENT (IOC), RIGHT, DIABETIC;  Surgeon: Ferol Rogue, MD;  Location: [facility] ORS;  Service: Ophthalmology;  Laterality: Right;  Lot # [id number] H US : 00:43.8 CDE: 4.59   CATARACT EXTRACTION W/PHACO Left 06/16/2019   Procedure: CATARACT EXTRACTION PHACO AND INTRAOCULAR LENS PLACEMENT (IOC) LEFT VISION BLUE DIABETIC;  Surgeon: Ferol Rogue, MD;  Location: [facility] ORS;  Service: Ophthalmology;  Laterality: Left;  Lot #[id number] H US : 00:46.9 CDE: 6.53   COCCYX REMOVAL      COLONOSCOPY WITH PROPOFOL  N/A 01/01/2017   Procedure: COLONOSCOPY WITH PROPOFOL ;  Surgeon: Therisa Bi, MD;  Location: [facility] ENDOSCOPY;  Service: Endoscopy;  Laterality: N/A;   COLONOSCOPY WITH PROPOFOL  N/A 12/11/2021   Procedure: COLONOSCOPY WITH PROPOFOL ;  Surgeon: Unk Corinn Skiff, MD;  Location: [facility] ENDOSCOPY;  Service: Gastroenterology;  Laterality: N/A;   ELBOW ARTHROSCOPY WITH TENDON RECONSTRUCTION     ESOPHAGOGASTRODUODENOSCOPY (EGD) WITH PROPOFOL  N/A 01/01/2017   Procedure: ESOPHAGOGASTRODUODENOSCOPY (EGD) WITH PROPOFOL ;  Surgeon: Therisa Bi, MD;  Location: [facility] ENDOSCOPY;  Service: Endoscopy;  Laterality: N/A;   ESOPHAGOGASTRODUODENOSCOPY (EGD) WITH PROPOFOL  N/A 12/11/2021   Procedure: ESOPHAGOGASTRODUODENOSCOPY (EGD) WITH PROPOFOL ;  Surgeon: Unk Corinn Skiff, MD;  Location: [facility] ENDOSCOPY;  Service: Gastroenterology;  Laterality: N/A;   INTRATHECAL PUMP IMPLANT     LEFT HEART CATH AND CORONARY ANGIOGRAPHY N/A 04/28/2017   Procedure: LEFT HEART CATH AND CORONARY ANGIOGRAPHY;  Surgeon: Mady Bruckner, MD;  Location: [facility] INVASIVE CV LAB;  Service: Cardiovascular;  Laterality: N/A;   MASTECTOMY Right 06/1997   morphine  pump  2011   PAIN PUMP REVISION N/A 02/11/2024   Procedure: PAIN PUMP REPLACEMENT;  Surgeon: Tanya Glisson, MD;  Location: [facility] ORS;  Service: Neurosurgery;  Laterality: N/A;   PLANTAR FASCIA RELEASE     TOTAL HIP ARTHROPLASTY Left    TRIGGER FINGER RELEASE      Current Medications: Current Meds  Medication Sig   ACCU-CHEK AVIVA PLUS test strip TEST THREE TIMES DAILY   Accu-Chek Softclix Lancets lancets TEST BLOOD SUGAR THREE TIMES DAILY   albuterol  (PROVENTIL ) (2.5 MG/3ML) 0.083% nebulizer solution Take 3 mLs (2.5 mg total) by nebulization every 4 (four) hours as needed for wheezing or shortness of breath.   albuterol  (VENTOLIN  HFA) 108 (90 Base) MCG/ACT inhaler Inhale 2 puffs into the lungs every 6 (six) hours as needed for wheezing or shortness of  breath.   apixaban  (ELIQUIS ) 5 MG TABS tablet Take 1 tablet (5 mg total) by mouth 2 (two) times daily.   atorvastatin  (LIPITOR) 40 MG tablet Take 1 tablet (40 mg total) by mouth daily.   Blood Glucose Monitoring Suppl (TRUE METRIX METER) w/Device KIT Use to check blood sugar 4 times a day   busPIRone  (BUSPAR ) 5 MG tablet Take 1 tablet (5 mg total) by mouth 2 (two) times daily.   cholecalciferol  (VITAMIN D3) 25 MCG (1000 UNIT) tablet Take 1,000 Units by mouth daily.   Continuous Glucose Receiver (FREESTYLE LIBRE 2 READER) DEVI To check blood sugars 3-4 times daily due to insulin  use. DX E11.40   Continuous Glucose Sensor (FREESTYLE LIBRE 2 SENSOR) MISC USE 1 SENSOR TO CHECK BLOOD SURGARS 3-4 TIMES DAILY DUE TO INSULIN  USE   Cyanocobalamin  1000 MCG/ML KIT Inject 1,000 mcg as directed  every 30 (thirty) days. Next injection 3/21   cyclobenzaprine  (FLEXERIL ) 5 MG tablet Take 5 mg by mouth daily as needed for muscle spasms.   dapagliflozin  propanediol (FARXIGA ) 10 MG TABS tablet Take 1 tablet (10 mg total) by mouth daily before breakfast.   docusate sodium  (COLACE) 100 MG capsule Take 1 capsule (100 mg total) by mouth 2 (two) times daily as needed for mild constipation.   Dulaglutide  (TRULICITY ) 4.5 MG/0.5ML SOAJ Inject 4.5 mg into the skin once a week.   ENTRESTO  24-26 MG Take 1 tablet by mouth 2 (two) times daily.   folic acid  (FOLVITE ) 1 MG tablet TAKE 1 TABLET EVERY DAY   furosemide  (LASIX ) 40 MG tablet Take 0.5 tablets (20 mg total) by mouth daily.   gabapentin  (NEURONTIN ) 400 MG capsule Take 1 capsule (400 mg total) by mouth 3 (three) times daily.   insulin  detemir (LEVEMIR ) 100 UNIT/ML injection Inject 55 Units into the skin at bedtime.   insulin  lispro (HUMALOG  KWIKPEN) 100 UNIT/ML KwikPen Inject 5 Units into the skin 3 (three) times daily before meals. Do not inject insulin  if you are not going to eat meal.  Only take before a meal.   metFORMIN  (GLUCOPHAGE ) 1000 MG tablet Take 1 tablet (1,000  mg total) by mouth 2 (two) times daily with a meal.   metoprolol  succinate (TOPROL -XL) 50 MG 24 hr tablet Take 1 tablet (50 mg total) by mouth daily. Take with or immediately following a meal.   metoprolol  tartrate (LOPRESSOR ) 100 MG tablet TAKE 1 TABLET 2 HR PRIOR TO CARDIAC PROCEDURE   montelukast  (SINGULAIR ) 10 MG tablet Take 1 tablet (10 mg total) by mouth at bedtime.   naloxone  (NARCAN ) nasal spray 4 mg/0.1 mL Place 1 spray into the nose as needed for up to 365 doses (for opioid-induced respiratory depresssion). In case of emergency (overdose), spray once into each nostril. If no response within 3 minutes, repeat application and call 911.   Nebulizers (EASY AIR COMPRESSOR NEBULIZER) MISC Use to inhaler nebulizer treatments at needed per instructions on nebulizer prescription   ondansetron  (ZOFRAN -ODT) 4 MG disintegrating tablet Take 1 tablet (4 mg total) by mouth every 8 (eight) hours as needed for nausea or vomiting.   OXYGEN  Inhale 2 L into the lungs at bedtime.   PAIN MANAGEMENT INTRATHECAL, IT, PUMP 1 each by Intrathecal route. Intrathecal (IT) medication:  Morphine  5.0 mg/ml, Bupivacaine  20.0 mg/ml,  Clonidine  100.0 mcg/ml Patient does not remember current. Adjusted every 3 months at [facility]   pantoprazole  (PROTONIX ) 40 MG tablet Take 1 tablet (40 mg total) by mouth daily.   TRESIBA  FLEXTOUCH 100 UNIT/ML FlexTouch Pen Inject 55 Units into the skin at bedtime.   triamcinolone  cream (KENALOG ) 0.1 % Apply 1 Application topically 2 (two) times daily.   [DISCONTINUED] TRELEGY ELLIPTA  100-62.5-25 MCG/ACT AEPB INHALE 1 PUFF INTO THE LUNGS DAILY . 42 DAY EXPIRATION AFTER OPENING FOIL TRAY   [DISCONTINUED] venlafaxine  XR (EFFEXOR -XR) 150 MG 24 hr capsule Take 1 capsule (150 mg total) by mouth daily.   Current Facility-Administered Medications for the 07/12/24 encounter (Office Visit) with Abigail Bernardino HERO, PA-C  Medication   cyanocobalamin  (VITAMIN B12) injection 1,000 mcg     Allergies:   Other, Pain patch [menthol], Jardiance  [empagliflozin ], Prednisone , Avelox  [moxifloxacin  hcl in nacl], Doxycycline , Erythromycin , Fentanyl , Moxifloxacin  hcl, Oxycontin [oxycodone], and Ozempic [semaglutide]   Social History   Socioeconomic History   Marital status: Married    Spouse name: Darina   Number of children: 2  Years of education: Not on file   Highest education level: High school graduate  Occupational History   Occupation: retired  Tobacco Use   Smoking status: Former    Current packs/day: 0.00    Average packs/day: 1 pack/day for 30.0 years (30.0 ttl pk-yrs)    Types: Cigarettes    Start date: 04/30/1974    Quit date: 04/30/2004    Years since quitting: 20.2    Passive exposure: Past   Smokeless tobacco: Never  Vaping Use   Vaping status: Never Used  Substance and Sexual Activity   Alcohol  use: No   Drug use: No   Sexual activity: Not Currently    Birth control/protection: Post-menopausal  Other Topics Concern   Not on file  Social History Narrative   ** Merged History Encounter ** ** Merged History Encounter **       Lives in home with mentally handicap son   Social Drivers of Corporate Investment Banker Strain: Low Risk  (01/29/2024)   Received from [facility]   Overall Financial Resource Strain (CARDIA)    Difficulty of Paying Living Expenses: Not hard at all  Food Insecurity: No Food Insecurity (01/29/2024)   Received from [facility]   Hunger Vital Sign    Within the past 12 months, you worried that your food would run out before you got the money to buy more.: Never true    Within the past 12 months, the food you bought just didn't last and you didn't have money to get more.: Never true  Transportation Needs: No Transportation Needs (01/29/2024)   Received from [facility]    In the past 12 months, has lack of transportation kept you from medical  appointments or from getting medications?: No    Lack of Transportation (Non-Medical): No  Physical Activity: Inactive (12/29/2023)   Exercise Vital Sign    Days of Exercise per Week: 0 days    Minutes of Exercise per Session: 0 min  Stress: No Stress Concern Present (12/29/2023)   [employer] of Occupational Health - Occupational Stress Questionnaire    Feeling of Stress : Only a little  Social Connections: Moderately Isolated (12/29/2023)   Social Connection and Isolation Panel    Frequency of Communication with Friends and Family: More than three times a week    Frequency of Social Gatherings with Friends and Family: More than three times a week    Attends Religious Services: More than 4 times per year    Active Member of [employer] or Organizations: No    Attends Banker Meetings: Never    Marital Status: Separated     Family History:  The patient's family history includes Arthritis in her paternal grandfather; Cancer in her paternal grandmother and sister; Cancer (age of onset: 50) in her father; Cancer (age of onset: 66) in her mother; Hyperlipidemia in her sister and son; Hypertension in her sister; Osteoporosis in her maternal grandmother; Seizures in her son; Thyroid  disease in her mother. There is no history of Breast cancer.  ROS:   12-point review of systems is negative unless otherwise noted in the HPI.   EKGs/Labs/Other Studies Reviewed:    Studies reviewed were summarized above. The additional studies were reviewed today:  2D echo 05/25/2024: 1. Left ventricular ejection fraction, by estimation, is 45 to 50%. The  left ventricle has mildly decreased function. The left ventricle  demonstrates global hypokinesis. Left ventricular  diastolic parameters are  consistent with Grade I diastolic  dysfunction (impaired relaxation). The average left ventricular global  longitudinal strain is -12.4 %. The global longitudinal strain is  abnormal.   2. Right  ventricular systolic function is normal. The right ventricular  size is normal.   3. The mitral valve is normal in structure. Mild mitral valve  regurgitation. No evidence of mitral stenosis.   4. The aortic valve is normal in structure. Aortic valve regurgitation is  not visualized. No aortic stenosis is present.   5. The inferior vena cava is normal in size with greater than 50%  respiratory variability, suggesting right atrial pressure of 3 mmHg.  __________  2D echo 08/11/2023: 1. Left ventricular ejection fraction, by estimation, is 25 to 30%. The  left ventricle has severely decreased function. The left ventricle  demonstrates global hypokinesis. Left ventricular diastolic parameters are  consistent with Grade I diastolic  dysfunction (impaired relaxation).   2. Right ventricular systolic function is low normal. The right  ventricular size is normal. Tricuspid regurgitation signal is inadequate  for assessing PA pressure.   3. The mitral valve is normal in structure. Trivial mitral valve  regurgitation. No evidence of mitral stenosis.   4. The aortic valve was not well visualized. Aortic valve regurgitation  is not visualized. No aortic stenosis is present.  __________   2D echo 10/22/2022: 1. Left ventricular ejection fraction, by estimation, is 45 to 50%. The  left ventricle has mildly decreased function. The left ventricle  demonstrates global hypokinesis. Left ventricular diastolic parameters are  indeterminate.   2. Right ventricular systolic function is normal. The right ventricular  size is normal.   3. The mitral valve is normal in structure. No evidence of mitral valve  regurgitation.   4. The aortic valve is tricuspid. Aortic valve regurgitation is not  visualized.   5. The inferior vena cava is normal in size with greater than 50%  respiratory variability, suggesting right atrial pressure of 3 mmHg.   Comparison(s): 09/11/21 EF w/ Def 25-30%.  __________    Lexiscan  MPI 11/14/2021: Pharmacological myocardial perfusion imaging study with no significant ischemia Small region apical thinning likely secondary to attenuation artifact Normal wall motion, EF estimated at 67% No EKG changes concerning for ischemia at peak stress or in recovery. CT attenuation correction images unavailable Low risk scan __________   Limited echo 10/31/2021: 1. Left ventricular ejection fraction, by estimation, is 40 to 45%. The  left ventricle has mildly decreased function. The left ventricle has no  regional wall motion abnormalities. Left ventricular diastolic parameters  are consistent with Grade I  diastolic dysfunction (impaired relaxation).   2. Right ventricular systolic function is normal. The right ventricular  size is normal. Tricuspid regurgitation signal is inadequate for assessing  PA pressure.   3. The mitral valve is normal in structure. No evidence of mitral valve  regurgitation. No evidence of mitral stenosis.   4. The aortic valve is normal in structure. Aortic valve regurgitation is  not visualized. No aortic stenosis is present.   5. The inferior vena cava is normal in size with greater than 50%  respiratory variability, suggesting right atrial pressure of 3 mmHg.  __________   2D echo 09/11/2021: 1. Left ventricular ejection fraction, by estimation, is 25 to 30%. The  left ventricle has severely decreased function. The left ventricle  demonstrates global hypokinesis. Left ventricular diastolic parameters are  consistent with Grade II diastolic  dysfunction (pseudonormalization).   2.  Right ventricular systolic function is moderately reduced. The right  ventricular size is not well visualized.   3. The mitral valve is normal in structure. No evidence of mitral valve  regurgitation.   4. The aortic valve is grossly normal. Aortic valve regurgitation is not  visualized.   5. The inferior vena cava is normal in size with <50% respiratory   variability, suggesting right atrial pressure of 8 mmHg. __________   2D echo 12/11/2020: 1. Left ventricular ejection fraction, by estimation, is 45%. The left  ventricle has mildly decreased function. The left ventricle demonstrates  global hypokinesis. Left ventricular diastolic parameters are consistent  with Grade II diastolic dysfunction  (pseudonormalization).   2. Right ventricular systolic function is normal. The right ventricular  size is normal.   3. The mitral valve is normal in structure. No evidence of mitral valve  regurgitation.   4. The aortic valve is tricuspid. Aortic valve regurgitation is not  visualized.   Comparison(s): EF 45-50%. __________   LHC 04/28/2017: Conclusions: No angiographically significant coronary artery disease. Severely reduced LV contraction (LVEF 25-30%) with mid and apical akinesis, consistent with Takotsubo (stress-induced) cardiomyopathy. Mildly elevated left ventricular filling pressure.   Recommendations: Medical therapy, including initiation of metoprolol  succinate 12.5 mg daily and furosemide  20 mg IV daily. If blood pressure tolerates, consider addition of low-dose ACE inhibitor tomorrow. Continue treatment of pain and opioid withdrawal.   EKG:  EKG is ordered today.  The EKG ordered today demonstrates NSR, 85 bpm, nonspecific ST-T changes  Recent Labs: 08/09/2023: B Natriuretic Peptide 241.9 08/19/2023: Magnesium  2.0 12/18/2023: TSH 2.010 03/01/2024: Hemoglobin 11.8; Platelets 439 06/22/2024: ALT 10; BUN 24; Creatinine, Ser 1.22; Sodium 140 07/07/2024: Potassium 4.8  Recent Lipid Panel    Component Value Date/Time   CHOL 126 06/22/2024 0923   CHOL 127 07/26/2019 0817   TRIG 82 06/22/2024 0923   TRIG 104 07/26/2019 0817   HDL 66 06/22/2024 0923   VLDL 21 07/26/2019 0817   LDLCALC 44 06/22/2024 0923    PHYSICAL EXAM:    VS:  BP 124/68 (BP Location: Left Arm, Patient Position: Sitting, Cuff Size: Normal)   Pulse 85   Ht  4' 10.5 (1.486 m)   Wt 147 lb (66.7 kg)   LMP  (LMP Unknown)   SpO2 98%   BMI 30.20 kg/m   BMI: Body mass index is 30.2 kg/m.  Physical Exam Constitutional:      Appearance: She is well-developed.  HENT:     Head: Normocephalic and atraumatic.  Eyes:     General:        Right eye: No discharge.        Left eye: No discharge.  Cardiovascular:     Rate and Rhythm: Normal rate and regular rhythm.     Heart sounds: Normal heart sounds, S1 normal and S2 normal. Heart sounds not distant. No midsystolic click and no opening snap. No murmur heard.    No friction rub.  Pulmonary:     Effort: Pulmonary effort is normal. No respiratory distress.     Breath sounds: Decreased breath sounds and wheezing present. No rhonchi or rales.     Comments: Mildly diminished breath sounds throughout with scattered faint wheezing.  On supplemental oxygen  via nasal cannula at 2 L. Musculoskeletal:     Cervical back: Normal range of motion.  Skin:    General: Skin is warm and dry.     Nails: There is no clubbing.  Neurological:  Mental Status: She is alert and oriented to person, place, and time.  Psychiatric:        Speech: Speech normal.        Behavior: Behavior normal.        Thought Content: Thought content normal.        Judgment: Judgment normal.     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  Follow-up echo in 05/2024 showed improvement in LV systolic function with an EF of 45 to 50%.  High-sensitivity troponin mildly elevated at 20 with a delta troponin of 95 in 08/2023, felt to be supply/demand ischemia in the setting of acute PE.  Historically, cardiomyopathy has been felt to be  nonischemic/stress-induced.  However, given persistent mild cardiomyopathy on follow-up echo, and in the context of elevated troponin and underlying dyspnea, it is prudent to exclude progressive coronary artery disease.  Patient is unable to pursue Lexiscan  MPI/myocardial PET/CT due to underlying chronic hypoxic respiratory failure requiring supplemental oxygen  with associated intermittent wheezing.  She is unable to treadmill secondary to deconditioning and chronic hypoxic respiratory failure.  In this setting, we will pursue coronary CTA.  Continue current GDMT including Entresto  24/26 mg twice daily, Toprol -XL 50 mg daily, Farxiga  10 mg daily, and furosemide  20 mg daily.  In follow-up, consider addition of MRA if renal function allows.  Check BMP.  Escalate to evidence-based pharmacotherapy as able moving forward.  Nonobstructive CAD with elevated troponin: LHC in 2018 showed angiographically normal coronary arteries.  Lexiscan  MPI in 2023 showed no significant ischemia with preserved LV systolic function and was overall low risk.  Pursue coronary CTA to evaluate for progressive CAD as outlined above.  On apixaban  in lieu of aspirin  to minimize bleeding risk.  Persistent A-fib: Maintaining sinus rhythm on Toprol -XL 50 mg daily.  CHA2DS2-VASc at least 6 (CHF, HTN, age x 1, DM, vascular disease, sex category).  She remains on apixaban  5 mg twice daily and does not meet reduced dosing criteria.  No falls or symptoms concerning for bleeding.  Recent hemoglobin stable.  Recurrent pulmonary embolism: She was initially diagnosed with PE in 2021 and treated with apixaban .  She was diagnosed with A-fib in 09/2021 and has been maintained on apixaban  since.  She reported adherence to apixaban , taking this twice daily every day and denies missing any doses, particularly leading up to her hospitalization in 08/2023, at which time she was diagnosed with multiple subsegmental PE, occurring while on anticoagulation.  In this  setting, she was referred to hematology to determine if alternative anticoagulation would be indicated with recommendation to continue anticoagulation indefinitely.   HTN: Blood pressure is well-controlled in the office today.  Continue Entresto  and Toprol -XL as outlined above.  HLD: LDL 44 in 06/2024 with normal AST/ALT at that time.  Remains on atorvastatin  40 mg.  Chronic hypoxic and hypercapnic respiratory failure on supplemental oxygen /COPD: Stable.  Followed by pulmonology.  CKD stage II-III: Check BMP prior to coronary CTA.     Disposition: F/u with Dr. Mady or an APP in 2 months.   Medication Adjustments/Labs and Tests Ordered: Current medicines are reviewed at length with the patient today.  Concerns regarding medicines are outlined above. Medication changes, Labs  and Tests ordered today are summarized above and listed in the Patient Instructions accessible in Encounters.   Signed, Bernardino Bring, PA-C 07/12/2024 1:09 PM     [facility] [place] [address] [place], [place] [address] [phone]

## 2024-07-12 ENCOUNTER — Ambulatory Visit: Attending: Physician Assistant | Admitting: Physician Assistant

## 2024-07-12 VITALS — BP 124/68 | HR 85 | Ht 58.5 in | Wt 147.0 lb

## 2024-07-12 DIAGNOSIS — R7989 Other specified abnormal findings of blood chemistry: Secondary | ICD-10-CM

## 2024-07-12 DIAGNOSIS — J449 Chronic obstructive pulmonary disease, unspecified: Secondary | ICD-10-CM

## 2024-07-12 DIAGNOSIS — Z79899 Other long term (current) drug therapy: Secondary | ICD-10-CM | POA: Diagnosis not present

## 2024-07-12 DIAGNOSIS — I4819 Other persistent atrial fibrillation: Secondary | ICD-10-CM

## 2024-07-12 DIAGNOSIS — I10 Essential (primary) hypertension: Secondary | ICD-10-CM | POA: Diagnosis not present

## 2024-07-12 DIAGNOSIS — E785 Hyperlipidemia, unspecified: Secondary | ICD-10-CM

## 2024-07-12 DIAGNOSIS — I428 Other cardiomyopathies: Secondary | ICD-10-CM | POA: Diagnosis not present

## 2024-07-12 DIAGNOSIS — I2699 Other pulmonary embolism without acute cor pulmonale: Secondary | ICD-10-CM | POA: Diagnosis not present

## 2024-07-12 DIAGNOSIS — J9611 Chronic respiratory failure with hypoxia: Secondary | ICD-10-CM | POA: Diagnosis not present

## 2024-07-12 DIAGNOSIS — I2510 Atherosclerotic heart disease of native coronary artery without angina pectoris: Secondary | ICD-10-CM | POA: Diagnosis not present

## 2024-07-12 DIAGNOSIS — I5022 Chronic systolic (congestive) heart failure: Secondary | ICD-10-CM

## 2024-07-12 MED ORDER — METOPROLOL TARTRATE 100 MG PO TABS: ORAL_TABLET | ORAL | 0 refills | Status: DC

## 2024-07-12 NOTE — Patient Instructions (Addendum)
 Medication Instructions:  Your physician recommends that you continue on your current medications as directed. Please refer to the Current Medication list given to you today.   *If you need a refill on your cardiac medications before your next appointment, please call your pharmacy*  Lab Work: Your provider would like for you to have following labs drawn today BMeT.   If you have labs (blood work) drawn today and your tests are completely normal, you will receive your results only by: MyChart Message (if you have MyChart) OR A paper copy in the mail If you have any lab test that is abnormal or we need to change your treatment, we will call you to review the results.  Testing/Procedures:   Your cardiac CT will be scheduled at one of the below locations:   [facility] [address] [place], [place] [address] [phone] (Severe contrast allergies only)  OR   [facility] [address] [place], [place] [address] [phone]  OR   [facility] [address] [place], [place] [address] [phone]  OR   [facility] and Vascular Tower [address]  [place], [place] [address]  OR   [facility] [address] [place], [place] [phone]  If scheduled at [facility], please arrive at the [facility] and Children's Entrance (Entrance C2) of [facility] 30 minutes prior to test start time. You can use the FREE valet parking offered at entrance C (encouraged to control the heart rate for the test)  Proceed to the [facility] Radiology Department (first floor) to check-in and test prep.  All radiology patients and guests should use entrance C2 at [facility], accessed from [facility], even though the hospital's physical address listed is [address]  If scheduled at the Heart and Vascular Tower at [address], please enter the parking lot using the  [address] entrance and use the FREE valet service at the patient drop-off area. Enter the building and check-in with registration on the main floor.  If scheduled at [facility], please arrive to the Heart and Vascular Center 15 mins early for check-in and test prep.  There is spacious parking and easy access to the radiology department from the [facility] Heart and Vascular entrance. Please enter here and check-in with the desk attendant.   If scheduled at [facility], please arrive 30 minutes early for check-in and test prep.  Please follow these instructions carefully (unless otherwise directed):  An IV will be required for this test and Nitroglycerin  will be given.  Hold all erectile dysfunction medications at least 3 days (72 hrs) prior to test. (Ie viagra, cialis, sildenafil, tadalafil, etc)   On the Night Before the Test: Be sure to Drink plenty of water . Do not consume any caffeinated/decaffeinated beverages or chocolate 12 hours prior to your test. Do not take any antihistamines 12 hours prior to your test.  On the Day of the Test: Drink plenty of water  until 1 hour prior to the test. Do not eat any food 1 hour prior to test. You may take your regular medications prior to the test.  Take metoprolol  (Lopressor ) two hours prior to test. Patients who wear a continuous glucose monitor MUST remove the device prior to scanning. FEMALES- please wear underwire-free bra if available, avoid dresses & tight clothing  After the Test: Drink plenty of water . After receiving IV contrast, you may  experience a mild flushed feeling. This is normal. On occasion, you may experience a mild rash up to 24 hours after the test. This is not dangerous. If this occurs, you can take Benadryl  25 mg, Zyrtec, Claritin, or Allegra and increase your fluid intake. (Patients taking Tikosyn should avoid Benadryl , and may take Zyrtec, Claritin, or Allegra) If you experience trouble  breathing, this can be serious. If it is severe call 911 IMMEDIATELY. If it is mild, please call our office.  We will call to schedule your test 2-4 weeks out understanding that some insurance companies will need an authorization prior to the service being performed.   For more information and frequently asked questions, please visit our website : [email or web address]  For non-scheduling related questions, please contact the cardiac imaging nurse navigator should you have any questions/concerns: Cardiac Imaging Nurse Navigators Direct Office Dial : [phone]   For scheduling needs, including cancellations and rescheduling, please call Brittany, [phone].   Follow-Up: At [facility], you and your health needs are our priority.  As part of our continuing mission to provide you with exceptional heart care, our providers are all part of one team.  This team includes your primary Cardiologist (physician) and Advanced Practice Providers or APPs (Physician Assistants and Nurse Practitioners) who all work together to provide you with the care you need, when you need it.  Your next appointment:   2 month(s)  Provider:   You may see Lonni Hanson, MD or Bernardino Bring, PA-C  We recommend signing up for the patient portal called MyChart.  Sign up information is provided on this After Visit Summary.  MyChart is used to connect with patients for Virtual Visits (Telemedicine).  Patients are able to view lab/test results, encounter notes, upcoming appointments, etc.  Non-urgent messages can be sent to your provider as well.   To learn more about what you can do with MyChart, go to [email or web address].

## 2024-07-13 LAB — BASIC METABOLIC PANEL WITH GFR
BUN/Creatinine Ratio: 14 (ref 12–28)
BUN: 14 mg/dL (ref 8–27)
CO2: 25 mmol/L (ref 20–29)
Calcium: 9.5 mg/dL (ref 8.7–10.3)
Chloride: 97 mmol/L (ref 96–106)
Creatinine, Ser: 0.99 mg/dL (ref 0.57–1.00)
Glucose: 161 mg/dL — ABNORMAL HIGH (ref 70–99)
Potassium: 5 mmol/L (ref 3.5–5.2)
Sodium: 137 mmol/L (ref 134–144)
eGFR: 60 mL/min/1.73 (ref >59)

## 2024-07-14 ENCOUNTER — Ambulatory Visit: Payer: Self-pay | Admitting: Physician Assistant

## 2024-07-22 ENCOUNTER — Ambulatory Visit

## 2024-07-22 DIAGNOSIS — E538 Deficiency of other specified B group vitamins: Secondary | ICD-10-CM | POA: Diagnosis not present

## 2024-07-22 NOTE — Progress Notes (Signed)
 Patient is in office today for a nurse visit for B12 Injection. Patient Injection was given in the  Right deltoid. Patient tolerated injection well.

## 2024-07-29 ENCOUNTER — Telehealth (HOSPITAL_COMMUNITY): Payer: Self-pay | Admitting: Emergency Medicine

## 2024-07-29 NOTE — Telephone Encounter (Signed)
 Attempted to call patient regarding upcoming cardiac CT appointment. Left message on voicemail with name and callback number Rockwell Alexandria RN Navigator Cardiac Imaging [facility] Heart and Vascular Services [phone] Office [phone] Cell

## 2024-08-01 ENCOUNTER — Ambulatory Visit
Admission: RE | Admit: 2024-08-01 | Discharge: 2024-08-01 | Disposition: A | Discharge status: Home / Self Care | Source: Ambulatory Visit | Attending: Physician Assistant | Admitting: Physician Assistant

## 2024-08-01 DIAGNOSIS — R7989 Other specified abnormal findings of blood chemistry: Secondary | ICD-10-CM | POA: Insufficient documentation

## 2024-08-01 DIAGNOSIS — I5022 Chronic systolic (congestive) heart failure: Secondary | ICD-10-CM | POA: Insufficient documentation

## 2024-08-01 MED ORDER — DILTIAZEM HCL 25 MG/5ML IV SOLN: 10.0000 mg | INTRAVENOUS | Status: DC | PRN

## 2024-08-01 MED ORDER — METOPROLOL TARTRATE 5 MG/5ML IV SOLN: 10.0000 mg | Freq: Once | INTRAVENOUS | Status: DC | PRN

## 2024-08-01 MED ORDER — NITROGLYCERIN 0.4 MG SL SUBL
0.8000 mg | SUBLINGUAL_TABLET | Freq: Once | SUBLINGUAL | Status: AC
  Administered 2024-08-01: 0.8 mg via SUBLINGUAL

## 2024-08-01 MED ORDER — IOHEXOL 350 MG/ML SOLN
100.0000 mL | Freq: Once | INTRAVENOUS | Status: AC | PRN
  Administered 2024-08-01: 100 mL via INTRAVENOUS

## 2024-08-01 NOTE — Progress Notes (Signed)
 Patient tolerated procedure well. Ambulate w/o difficulty. Denies any lightheadedness or being dizzy. Pt denies any pain at this time. Sitting in chair. Pt is encouraged to drink additional water throughout the day and reason explained to patient. Patient verbalized understanding and all questions answered. ABC intact. No further needs at this time. Discharge from procedure area w/o issues.

## 2024-08-01 NOTE — Progress Notes (Signed)
 Pt c/o right arm pain at the iv site, pt noted to have swelling distal to the iv placement, attempted to withdraw any substance back through the iv unsuccessfully, gentle manual pressure applied, and pt tol well, radiologist  mckenzie mcinnis at bedside with evaluation and discharge care instructions and reasons to call back, pt verbally acknowledged understanding. Ice pack applied and wrapp applied to arm, pt has good cap refill, rom, and radial pulses palpated

## 2024-08-01 NOTE — Progress Notes (Signed)
 Evaluation after Contrast Extravasation:  Patient seen and examined immediately after contrast extravasation while in CT 1.  Exam: There is moderate swelling of the right forearm area.  There is no erythema. There is minimal bruising inferior to the puncture site, but no other discoloration. There are no blisters. There are no signs of decreased perfusion of the skin.  It is warm to touch.  The patient has full ROM in fingers.  Radial pulse is 2+.  Per contrast extravasation protocol, I have instructed the patient to keep an ice pack on the area for 20-60 minutes at a time for about 48 hours.   Keep arm elevated as much as possible.   The patient understands to call the radiology department if there is: - increase in pain or swelling - changed or altered sensation - ulceration or blistering - increasing redness - warmth or increasing firmness - decreased tissue perfusion as noted by decreased capillary refill or discoloration of skin - decreased pulses peripheral to site   [employer] PA-C 08/01/2024 11:09 AM

## 2024-08-03 ENCOUNTER — Telehealth: Payer: Self-pay | Admitting: Family

## 2024-08-03 NOTE — Telephone Encounter (Signed)
 Called to confirm/remind patient of their appointment [facility] on 08[facility].   Appointment:   [] Confirmed  [] Left mess   [] No answer/No voice mail  [x] VM Full/unable to leave message  [] Phone not in service  Patient reminded to bring all medications and/or complete list.  Confirmed patient has transportation. Gave directions, instructed to utilize valet parking.

## 2024-08-04 NOTE — Progress Notes (Unsigned)
 "  Advanced Heart Failure Clinic Note     PCP: [name], NP  Cardiologist: Lonni Hanson, MD   Chief Complaint: shortness of breath   HPI:  Jordan Chan is a 74 y/o female with a history of B12 deficiency, chronic pain syndrome s/p intrathecal pump, paroxysmal atrial fibrillation, stress-induced cardiomyopathy in the setting of pain pump malfunction and opioid withdrawal (2018), HTN, hyperlipidemia, PE (2021), DM, COPD, torticollis, GERD, OSA, breast cancer, chronic back pain,anemia and IBS.   Echo 12/11/20: EF 45% with Grade II DD Echo 09/11/21: EF 25-30% with Grade II DD Echo 10/31/21: EF 40-45% with Grade I DD  Admitted 04/15/23 due to worsening of left hip pain at the surgical site associated with increasing redness about the left hip surgical wound. Tachycardic. WBC 17.6. CXR normal.Thought to be septic due to left hip surgical wound infection. Treated with IV fluids, analgesics and empiric IV antibiotics. Transferred to [facility].    Admitted 08/09/23 due to acute respiratory distress found with sats in the 50 to 60% on RA. Found to have multiple PE's and sepsis due to aspiration pneumonia. Initially intubated. Completed antibiotics & successfully extubated. Underwent swallowing evaluation. Heparin  stopped and transitioned to eliquis . Anemia stable. Echo 08/11/23: EF 25-30% with Grade I DD  Had morphine  pump replaced 02/11/24  Echo 05/25/24: EF 45-50%, G1DD, normal RV, mild MR  Coronary CTA done 08/01/24: Calcium  score 18. Minimal non-obstructive CAD  She presents today for a HF follow-up visit with a chief complaint of shortness of breath. Has associated fatigue, chronic difficulty sleeping, chronic back pain, neck pain (torticollis). Denies chest pain, palpitations, dizziness, edema. Wears morphine  pump.     Most recent echo shows improving EF. Wearing oxygen  at 2L upon exertion and at bedtime. Quit smoking ~2006 when a grandchild was going to be born.  Denies alcohol  use. Not adding salt to her food. Weighing daily   Previous cardiac studies:  LHC 04/28/17:  No angiographically significant coronary artery disease. Severely reduced LV contraction (LVEF 25-30%) with mid and apical akinesis, consistent with Takotsubo (stress-induced) cardiomyopathy. Mildly elevated left ventricular filling pressure.  ROS: All systems negative except what is listed in HPI, PMH and Problem List  Past Medical History:  Diagnosis Date   (HFpEF) heart failure with preserved ejection fraction (HCC)    Anemia    Anxiety    Aortic atherosclerosis    Arthritis    Asthma    Bilateral pulmonary embolism (small subsegmental) 08/11/2023   Breast cancer, right (HCC) 1998   a.) Tx'd with surgical excision + chemotherapy + XRT   Bronchitis    Cerebral microvascular disease    Chronic pain syndrome    CKD (chronic kidney disease), stage III (HCC)    COPD (chronic obstructive pulmonary disease) (HCC)    DDD (degenerative disc disease), cervical    Decreased ROM of intervertebral discs of cervical spine    Dependence on nocturnal oxygen  therapy (2L/[place])    Depression    Diabetes mellitus type 2, insulin  dependent (HCC)    Diverticulitis    Dyspnea    Endometriosis    GERD (gastroesophageal reflux disease)    History of shingles    Hypercholesteremia    Hypertension    IBS (irritable bowel syndrome)    Low back pain    a.) has intrathecal pump (morphine /bupivicaine/clonidine )   Neuropathy    On apixaban  therapy    Opioid withdrawal (HCC)    a.) experiences withdrawal symptoms when batteries in intrathecal  pump reach EOL   Orthopnea    PAF (paroxysmal atrial fibrillation) (HCC)    a.) CHA2DS2VASc = 8 (age, sex, HFpEF, HTN, TIA/PE x 2, vascular disease, T2DM) as of metoprolol  succinate; b.) rate/rhythm maintained on oral metoprolol  succinate; chronically anticoagulated with apixaban    Personal history of chemotherapy    Personal history of radiation therapy     Pneumonia    Scoliosis    Sleep apnea    a.) no nocturnal PAP therapy; requires supplemental oxygen  at bedtime   Status post bilateral cataract extraction 2020   Takotsubo cardiomyopathy    TIA (transient ischemic attack) 2010    Current Outpatient Medications  Medication Sig Dispense Refill   ACCU-CHEK AVIVA PLUS test strip TEST THREE TIMES DAILY 300 strip 3   Accu-Chek Softclix Lancets lancets TEST BLOOD SUGAR THREE TIMES DAILY 300 each 3   albuterol  (PROVENTIL ) (2.5 MG/3ML) 0.083% nebulizer solution Take 3 mLs (2.5 mg total) by nebulization every 4 (four) hours as needed for wheezing or shortness of breath. 75 mL 1   albuterol  (VENTOLIN  HFA) 108 (90 Base) MCG/ACT inhaler Inhale 2 puffs into the lungs every 6 (six) hours as needed for wheezing or shortness of breath. 1 each 1   apixaban  (ELIQUIS ) 5 MG TABS tablet Take 1 tablet (5 mg total) by mouth 2 (two) times daily. 180 tablet 3   atorvastatin  (LIPITOR) 40 MG tablet Take 1 tablet (40 mg total) by mouth daily. 90 tablet 3   Blood Glucose Monitoring Suppl (TRUE METRIX METER) w/Device KIT Use to check blood sugar 4 times a day 1 kit 4   busPIRone  (BUSPAR ) 5 MG tablet Take 1 tablet (5 mg total) by mouth 2 (two) times daily. 30 tablet 1   cholecalciferol  (VITAMIN D3) 25 MCG (1000 UNIT) tablet Take 1,000 Units by mouth daily.     Continuous Glucose Receiver (FREESTYLE LIBRE 2 READER) DEVI To check blood sugars 3-4 times daily due to insulin  use. DX E11.40 2 each 5   Continuous Glucose Sensor (FREESTYLE LIBRE 2 SENSOR) MISC USE 1 SENSOR TO CHECK BLOOD SURGARS 3-4 TIMES DAILY DUE TO INSULIN  USE 6 each 4   Cyanocobalamin  1000 MCG/ML KIT Inject 1,000 mcg as directed every 30 (thirty) days. Next injection 3/21     cyclobenzaprine  (FLEXERIL ) 5 MG tablet Take 5 mg by mouth daily as needed for muscle spasms.     dapagliflozin  propanediol (FARXIGA ) 10 MG TABS tablet Take 1 tablet (10 mg total) by mouth daily before breakfast. 90 tablet 3   docusate  sodium (COLACE) 100 MG capsule Take 1 capsule (100 mg total) by mouth 2 (two) times daily as needed for mild constipation. 30 capsule 0   Dulaglutide  (TRULICITY ) 4.5 MG/0.5ML SOAJ Inject 4.5 mg into the skin once a week. 12 mL 3   ENTRESTO  24-26 MG Take 1 tablet by mouth 2 (two) times daily.     folic acid  (FOLVITE ) 1 MG tablet TAKE 1 TABLET EVERY DAY 90 tablet 0   furosemide  (LASIX ) 40 MG tablet Take 0.5 tablets (20 mg total) by mouth daily. 30 tablet 1   gabapentin  (NEURONTIN ) 400 MG capsule Take 1 capsule (400 mg total) by mouth 3 (three) times daily. 90 capsule 12   insulin  detemir (LEVEMIR ) 100 UNIT/ML injection Inject 55 Units into the skin at bedtime.     insulin  lispro (HUMALOG  KWIKPEN) 100 UNIT/ML KwikPen Inject 5 Units into the skin 3 (three) times daily before meals. Do not inject insulin  if you are not going  to eat meal.  Only take before a meal. 15 mL 3   metFORMIN  (GLUCOPHAGE ) 1000 MG tablet Take 1 tablet (1,000 mg total) by mouth 2 (two) times daily with a meal. 180 tablet 3   metoprolol  succinate (TOPROL -XL) 50 MG 24 hr tablet Take 1 tablet (50 mg total) by mouth daily. Take with or immediately following a meal. 90 tablet 3   metoprolol  tartrate (LOPRESSOR ) 100 MG tablet TAKE 1 TABLET 2 HR PRIOR TO CARDIAC PROCEDURE 1 tablet 0   montelukast  (SINGULAIR ) 10 MG tablet Take 1 tablet (10 mg total) by mouth at bedtime. 90 tablet 3   Nebulizers (EASY AIR COMPRESSOR NEBULIZER) MISC Use to inhaler nebulizer treatments at needed per instructions on nebulizer prescription 1 each 0   ondansetron  (ZOFRAN -ODT) 4 MG disintegrating tablet Take 1 tablet (4 mg total) by mouth every 8 (eight) hours as needed for nausea or vomiting. 20 tablet 0   OXYGEN  Inhale 2 L into the lungs at bedtime.     PAIN MANAGEMENT INTRATHECAL, IT, PUMP 1 each by Intrathecal route. Intrathecal (IT) medication:  Morphine  5.0 mg/ml, Bupivacaine  20.0 mg/ml,  Clonidine  100.0 mcg/ml Patient does not remember current. Adjusted  every 3 months at [facility]     pantoprazole  (PROTONIX ) 40 MG tablet Take 1 tablet (40 mg total) by mouth daily. 90 tablet 3   TRELEGY ELLIPTA  100-62.5-25 MCG/ACT AEPB INHALE 1 PUFF INTO THE LUNGS DAILY (42 DAY EXPIRATION AFTER OPENING FOIL TRAY) 180 each 3   TRESIBA  FLEXTOUCH 100 UNIT/ML FlexTouch Pen Inject 55 Units into the skin at bedtime. 30 mL 5   triamcinolone  cream (KENALOG ) 0.1 % Apply 1 Application topically 2 (two) times daily. 45 each 0   venlafaxine  XR (EFFEXOR -XR) 150 MG 24 hr capsule TAKE 1 CAPSULE EVERY DAY 90 capsule 3   Current Facility-Administered Medications  Medication Dose Route Frequency Provider Last Rate Last Admin   cyanocobalamin  (VITAMIN B12) injection 1,000 mcg  1,000 mcg Intramuscular Q30 days [name], Jolene T, NP   1,000 mcg at 07/22/24 9047    Allergies  Allergen Reactions   Other Palpitations    IV steroids   Pain Patch [Menthol] Anaphylaxis   Jardiance  [Empagliflozin ] Other (See Comments)    Yeast infection & dry mouth   Prednisone     Avelox  [Moxifloxacin  Hcl In Nacl] Other (See Comments)    Upset stomach   Doxycycline  Diarrhea   Erythromycin  Nausea Only and Other (See Comments)    Has used azithromycin  without documented ADRs   Fentanyl  Nausea Only and Rash   Moxifloxacin  Hcl Other (See Comments)    Upset stomach   Oxycontin [Oxycodone] Hives   Ozempic [Semaglutide] Nausea Only      Social History   Socioeconomic History   Marital status: Married    Spouse name: Darina   Number of children: 2   Years of education: Not on file   Highest education level: High school graduate  Occupational History   Occupation: retired  Tobacco Use   Smoking status: Former    Current packs/day: 0.00    Average packs/day: 1 pack/day for 30.0 years (30.0 ttl pk-yrs)    Types: Cigarettes    Start date: 04/30/1974    Quit date: 04/30/2004    Years since quitting: 20.2    Passive exposure: Past   Smokeless tobacco: Never  Vaping Use   Vaping  status: Never Used  Substance and Sexual Activity   Alcohol  use: No   Drug use: No   Sexual  activity: Not Currently    Birth control/protection: Post-menopausal  Other Topics Concern   Not on file  Social History Narrative   ** Merged History Encounter ** ** Merged History Encounter **       Lives in home with mentally handicap son   Social Drivers of Health   Tobacco Use: Medium Risk (06/22/2024)   Patient History    Smoking Tobacco Use: Former    Smokeless Tobacco Use: Never    Passive Exposure: Past  Physicist, Medical Strain: Low Risk  (01/29/2024)   Received from [facility]   Overall Financial Resource Strain (CARDIA)    Difficulty of Paying Living Expenses: Not hard at all  Food Insecurity: No Food Insecurity (01/29/2024)   Received from [facility]   Epic    Within the past 12 months, you worried that your food would run out before you got the money to buy more.: Never true    Within the past 12 months, the food you bought just didn't last and you didn't have money to get more.: Never true  Transportation Needs: No Transportation Needs (01/29/2024)   Received from [facility] - Transportation    In the past 12 months, has lack of transportation kept you from medical appointments or from getting medications?: No    Lack of Transportation (Non-Medical): No  Physical Activity: Inactive (12/29/2023)   Exercise Vital Sign    Days of Exercise per Week: 0 days    Minutes of Exercise per Session: 0 min  Stress: No Stress Concern Present (12/29/2023)   [employer] of Occupational [employer] - Occupational Stress Questionnaire    Feeling of Stress : Only a little  Social Connections: Moderately Isolated (12/29/2023)   Social Connection and Isolation Panel    Frequency of Communication with Friends and Family: More than three times a week    Frequency of Social Gatherings with Friends and Family: More than three  times a week    Attends Religious Services: More than 4 times per year    Active Member of [employer] or Organizations: No    Attends Banker Meetings: Never    Marital Status: Separated  Intimate Partner Violence: Not At Risk (12/29/2023)   Humiliation, Afraid, Rape, and Kick questionnaire    Fear of Current or Ex-Partner: No    Emotionally Abused: No    Physically Abused: No    Sexually Abused: No  Depression (PHQ2-9): Low Risk (06/24/2024)   Depression (PHQ2-9)    PHQ-2 Score: 1  Recent Concern: Depression (PHQ2-9) - Medium Risk (06/22/2024)   Depression (PHQ2-9)    PHQ-2 Score: 6  Alcohol  Screen: Low Risk (12/29/2023)   Alcohol  Screen    Last Alcohol  Screening Score (AUDIT): 0  Housing: Low Risk  (01/29/2024)   Received from [facility]   Epic    In the last 12 months, was there a time when you were not able to pay the mortgage or rent on time?: No    In the past 12 months, how many times have you moved where you were living?: 0    At any time in the past 12 months, were you homeless or living in a shelter (including now)?: No  Utilities: Not At Risk (01/29/2024)   Received from [facility]   Epic    In the past 12 months has the electric, gas, oil, or water  company threatened to shut off services in  your home?: No  Health Literacy: Adequate Health Literacy (12/29/2023)   B1300 Health Literacy    Frequency of need for help with medical instructions: Never      Family History  Problem Relation Age of Onset   Cancer Mother 21       lung   Thyroid  disease Mother    Cancer Father 73       Pancreatic   Hypertension Sister    Cancer Sister        cancer on face   Hyperlipidemia Sister    Osteoporosis Maternal Grandmother    Cancer Paternal Grandmother        colon   Arthritis Paternal Grandfather    Hyperlipidemia Son    Seizures Son    Breast cancer Neg Hx    Vitals:   08/05/24 1011  BP: (!) 111/52  Pulse: 80  SpO2:  100%  Weight: 148 lb 9.6 oz (67.4 kg)   Wt Readings from Last 3 Encounters:  08/05/24 148 lb 9.6 oz (67.4 kg)  07/12/24 147 lb (66.7 kg)  06/22/24 145 lb 9.6 oz (66 kg)   Lab Results  Component Value Date   CREATININE 0.99 07/12/2024   CREATININE 1.22 (H) 06/22/2024   CREATININE 1.17 (H) 04/12/2024     PHYSICAL EXAM:  General: Well appearing. Torticollis in neck Cor: No JVD. Regular rhythm, rate.  Lungs: clear Abdomen: soft, nontender, nondistended. Extremities: no edema. Right inner forearm bruised Neuro:. Affect pleasant   ECG: not done   ASSESSMENT & PLAN:  1: NICM with reduced ejection fraction although improving- - suspect due to stress induced after pain pump malfunction & opioid withdrawal in 2018 - NYHA Chan II - euvolemic - weight up 6 pounds from last visit here 4 months ago - Echo 12/11/20: EF 45% with Grade II DD - Echo 09/11/21: EF 25-30% with Grade II DD - Echo 10/31/21: EF 40-45% with Grade I DD - Echo 08/11/23: EF 25-30% with Grade I DD - Echo 05/25/24: EF 45-50%, G1DD, normal RV, mild MR - Coronary CTA done 08/01/24: Calcium  score 18. Minimal non-obstructive CAD - continue farxiga  10mg  daily - continue entresto  24/26mg  BID. BP may not tolerate titration - continue furosemide  20mg  daily - continue metoprolol  succinate 50mg  daily - unable to use MRA due to hyperkalemia  - not adding salt to her food - BNP 08/09/23 was 241.9  2: HTN- - BP controlled 111/52 - saw PCP Jordan Chan) 11/25 - BMET 07/12/24 reviewed: sodium 137, potassium 5.0, creatinine 0.99 & GFR 60  3: DM- - A1c 06/22/24 was 6.8% - continue insulin  - continue metformin , trulicity   4: COPD- - saw pulmonology Jordan Chan) 11/25 - wearing oxygen  at 2L at bedtime/ exertion but will take it off at home.   5: Atrial fibrillation- - saw cardiology (Jordan Chan) 12/25 - continue apixaban  5mg  BID - continue metoprolol  succinate 50mg  daily  6: Chronic pain- - follows with pain clinic - has chronic  intrathecal pain pump; replaced 07/25 - continue gabapentin  400mg  TID  7: Anemia- - Hg 03/01/24 reviewed and was 11.8 - saw GI Jordan Chan) 03/25   Return in 6 months, sooner if needed.   I spent 30 minutes reviewing records, interviewing/ examing patient and managing plan/ orders.   Jordan DELENA Class, FNP 08/04/2024  "

## 2024-08-05 ENCOUNTER — Encounter: Payer: Self-pay | Admitting: Family

## 2024-08-05 ENCOUNTER — Ambulatory Visit: Attending: Family | Admitting: Family

## 2024-08-05 VITALS — BP 111/52 | HR 80 | Wt 148.6 lb

## 2024-08-05 DIAGNOSIS — Z86711 Personal history of pulmonary embolism: Secondary | ICD-10-CM | POA: Insufficient documentation

## 2024-08-05 DIAGNOSIS — I130 Hypertensive heart and chronic kidney disease with heart failure and stage 1 through stage 4 chronic kidney disease, or unspecified chronic kidney disease: Secondary | ICD-10-CM | POA: Diagnosis not present

## 2024-08-05 DIAGNOSIS — E1140 Type 2 diabetes mellitus with diabetic neuropathy, unspecified: Secondary | ICD-10-CM | POA: Diagnosis not present

## 2024-08-05 DIAGNOSIS — J449 Chronic obstructive pulmonary disease, unspecified: Secondary | ICD-10-CM

## 2024-08-05 DIAGNOSIS — Z794 Long term (current) use of insulin: Secondary | ICD-10-CM | POA: Diagnosis not present

## 2024-08-05 DIAGNOSIS — I480 Paroxysmal atrial fibrillation: Secondary | ICD-10-CM | POA: Diagnosis not present

## 2024-08-05 DIAGNOSIS — Z7901 Long term (current) use of anticoagulants: Secondary | ICD-10-CM | POA: Diagnosis not present

## 2024-08-05 DIAGNOSIS — Z853 Personal history of malignant neoplasm of breast: Secondary | ICD-10-CM | POA: Insufficient documentation

## 2024-08-05 DIAGNOSIS — M436 Torticollis: Secondary | ICD-10-CM | POA: Diagnosis not present

## 2024-08-05 DIAGNOSIS — Z978 Presence of other specified devices: Secondary | ICD-10-CM | POA: Diagnosis not present

## 2024-08-05 DIAGNOSIS — Z7985 Long-term (current) use of injectable non-insulin antidiabetic drugs: Secondary | ICD-10-CM | POA: Insufficient documentation

## 2024-08-05 DIAGNOSIS — E1122 Type 2 diabetes mellitus with diabetic chronic kidney disease: Secondary | ICD-10-CM | POA: Insufficient documentation

## 2024-08-05 DIAGNOSIS — I5022 Chronic systolic (congestive) heart failure: Secondary | ICD-10-CM | POA: Diagnosis not present

## 2024-08-05 DIAGNOSIS — I10 Essential (primary) hypertension: Secondary | ICD-10-CM | POA: Diagnosis not present

## 2024-08-05 DIAGNOSIS — I428 Other cardiomyopathies: Secondary | ICD-10-CM | POA: Diagnosis not present

## 2024-08-05 DIAGNOSIS — G894 Chronic pain syndrome: Secondary | ICD-10-CM | POA: Insufficient documentation

## 2024-08-05 DIAGNOSIS — D649 Anemia, unspecified: Secondary | ICD-10-CM | POA: Diagnosis not present

## 2024-08-05 DIAGNOSIS — E785 Hyperlipidemia, unspecified: Secondary | ICD-10-CM | POA: Insufficient documentation

## 2024-08-05 DIAGNOSIS — M549 Dorsalgia, unspecified: Secondary | ICD-10-CM | POA: Insufficient documentation

## 2024-08-05 DIAGNOSIS — Z79899 Other long term (current) drug therapy: Secondary | ICD-10-CM | POA: Diagnosis not present

## 2024-08-05 DIAGNOSIS — I2510 Atherosclerotic heart disease of native coronary artery without angina pectoris: Secondary | ICD-10-CM | POA: Diagnosis not present

## 2024-08-05 DIAGNOSIS — D508 Other iron deficiency anemias: Secondary | ICD-10-CM

## 2024-08-05 DIAGNOSIS — Z7984 Long term (current) use of oral hypoglycemic drugs: Secondary | ICD-10-CM | POA: Diagnosis not present

## 2024-08-05 DIAGNOSIS — R0602 Shortness of breath: Secondary | ICD-10-CM | POA: Diagnosis present

## 2024-08-05 DIAGNOSIS — N183 Chronic kidney disease, stage 3 unspecified: Secondary | ICD-10-CM | POA: Diagnosis not present

## 2024-08-05 DIAGNOSIS — J4489 Other specified chronic obstructive pulmonary disease: Secondary | ICD-10-CM | POA: Insufficient documentation

## 2024-08-05 DIAGNOSIS — G4733 Obstructive sleep apnea (adult) (pediatric): Secondary | ICD-10-CM | POA: Insufficient documentation

## 2024-08-05 DIAGNOSIS — E119 Type 2 diabetes mellitus without complications: Secondary | ICD-10-CM

## 2024-08-05 NOTE — Patient Instructions (Signed)
 It was good to see you today!

## 2024-08-09 DIAGNOSIS — M7062 Trochanteric bursitis, left hip: Secondary | ICD-10-CM | POA: Insufficient documentation

## 2024-08-22 ENCOUNTER — Ambulatory Visit: Payer: Self-pay

## 2024-08-22 MED ORDER — PAIN MANAGEMENT IT PUMP REFILL: 1.0000 | Freq: Once | INTRATHECAL | 0 refills | Status: AC

## 2024-08-22 NOTE — Telephone Encounter (Signed)
 FYI Only or Action Required?: FYI only for provider: appointment scheduled on 1/20.  Patient was last seen in primary care on 06/22/2024 by [name], NP.  Called Nurse Triage reporting Cough.  Symptoms began 4 days ago.  Interventions attempted: Prescription medications: inhalers, tylenol .  Symptoms are: gradually worsening.  Triage Disposition: See Physician Within 24 Hours  Patient/caregiver understands and will follow disposition?: Yes    Reason for Triage:  She is not breathing good when she gets up and moves around or starts coughing, she is coughing up green/yellow mucus. This has been going on for about 4 days.   Reason for Disposition  SEVERE coughing spells (e.g., whooping sound after coughing, vomiting after coughing)  Answer Assessment - Initial Assessment Questions 1. ONSET: When did the cough begin?      4 days ago 2. SEVERITY: How bad is the cough today?      moderate 3. SPUTUM: Describe the color of your sputum (e.g., none, dry cough; clear, white, yellow, green)     yellow 4. HEMOPTYSIS: Are you coughing up any blood? If Yes, ask: How much? (e.g., flecks, streaks, tablespoons, etc.)     no 5. DIFFICULTY BREATHING: Are you having difficulty breathing? If Yes, ask: How bad is it? (e.g., mild, moderate, severe)      With coughing spells 6. FEVER: Do you have a fever? If Yes, ask: What is your temperature, how was it measured, and when did it start?     no 7. CARDIAC HISTORY: Do you have any history of heart disease? (e.g., heart attack, congestive heart failure)       8. LUNG HISTORY: Do you have any history of lung disease?  (e.g., pulmonary embolus, asthma, emphysema)     yes 9. PE RISK FACTORS: Do you have a history of blood clots? (or: recent major surgery, recent prolonged travel, bedridden)     no 10. OTHER SYMPTOMS: Do you have any other symptoms? (e.g., runny nose, wheezing, chest pain)       denies  Protocols  used: Cough - Acute Productive-A-AH

## 2024-08-23 ENCOUNTER — Ambulatory Visit: Admitting: Nurse Practitioner

## 2024-08-23 ENCOUNTER — Encounter: Payer: Self-pay | Admitting: Nurse Practitioner

## 2024-08-23 ENCOUNTER — Ambulatory Visit: Payer: Self-pay | Admitting: Nurse Practitioner

## 2024-08-23 ENCOUNTER — Ambulatory Visit

## 2024-08-23 VITALS — BP 111/66 | HR 82 | Temp 97.4°F | Ht 58.5 in | Wt 153.4 lb

## 2024-08-23 DIAGNOSIS — R0602 Shortness of breath: Secondary | ICD-10-CM

## 2024-08-23 DIAGNOSIS — R051 Acute cough: Secondary | ICD-10-CM | POA: Diagnosis not present

## 2024-08-23 DIAGNOSIS — E538 Deficiency of other specified B group vitamins: Secondary | ICD-10-CM

## 2024-08-23 LAB — VERITOR FLU A/B WAIVED
Influenza A: NEGATIVE
Influenza B: NEGATIVE

## 2024-08-23 LAB — POC COVID19 BINAXNOW: SARS Coronavirus 2 Ag: NEGATIVE

## 2024-08-23 MED ORDER — AMOXICILLIN-POT CLAVULANATE 875-125 MG PO TABS: 1.0000 | ORAL_TABLET | Freq: Two times a day (BID) | ORAL | 0 refills | Status: AC

## 2024-08-23 MED ORDER — ALBUTEROL SULFATE HFA 108 (90 BASE) MCG/ACT IN AERS: 2.0000 | INHALATION_SPRAY | Freq: Four times a day (QID) | RESPIRATORY_TRACT | 1 refills | Status: AC | PRN

## 2024-08-23 NOTE — Progress Notes (Signed)
 "  BP 111/66 (BP Location: Right Arm, Patient Position: Sitting, Cuff Size: Normal)   Pulse 82   Temp (!) 97.4 F (36.3 C) (Oral)   Ht 4' 10.5 (1.486 m)   Wt 153 lb 6.4 oz (69.6 kg)   LMP  (LMP Unknown)   SpO2 99%   BMI 31.51 kg/m    Subjective:    [name], female    DOB: 05-10-1951, 74 y.o.   MRN: [id number]  HPI: Jordan Chan is a 74 y.o. female  Chief Complaint  Patient presents with   Cough    Patient stated the cough and SOB started 4-5 days ago. Also she mentioned a runny nose.    UPPER RESPIRATORY TRACT INFECTION Worst symptom: Patient has been sick for about 4-5 days.  Currently on 2L of O2 which is her baseline. Fever: no Cough: yes Shortness of breath: yes Wheezing: yes Chest pain: no Chest tightness: no Chest congestion: no Nasal congestion: yes Runny nose: yes Post nasal drip: no Sneezing: no Sore throat: yes- 1 day Swollen glands: no Sinus pressure: no Headache: no Face pain: no Toothache: no Ear pain: no bilateral Ear pressure: no bilateral Eyes red/itching:no Eye drainage/crusting: no  Vomiting: no Rash: no Fatigue: no Sick contacts: no Strep contacts: no  Context: stable Recurrent sinusitis: no Relief with OTC cold/cough medications: no  Treatments attempted: none   Relevant past medical, surgical, family and social history reviewed and updated as indicated. Interim medical history since our last visit reviewed. Allergies and medications reviewed and updated.  Review of Systems  Constitutional:  Positive for fatigue. Negative for fever.  HENT:  Positive for congestion, rhinorrhea and sore throat. Negative for dental problem, ear pain, postnasal drip, sinus pressure, sinus pain and sneezing.   Respiratory:  Positive for cough, shortness of breath and wheezing.   Cardiovascular:  Negative for chest pain.  Gastrointestinal:  Negative for vomiting.  Skin:  Negative for rash.  Neurological:  Negative for  headaches.    Per HPI unless specifically indicated above     Objective:    BP 111/66 (BP Location: Right Arm, Patient Position: Sitting, Cuff Size: Normal)   Pulse 82   Temp (!) 97.4 F (36.3 C) (Oral)   Ht 4' 10.5 (1.486 m)   Wt 153 lb 6.4 oz (69.6 kg)   LMP  (LMP Unknown)   SpO2 99%   BMI 31.51 kg/m   Wt Readings from Last 3 Encounters:  08/23/24 153 lb 6.4 oz (69.6 kg)  08/05/24 148 lb 9.6 oz (67.4 kg)  07/12/24 147 lb (66.7 kg)    Physical Exam Vitals and nursing note reviewed.  Constitutional:      General: She is not in acute distress.    Appearance: Normal appearance. She is normal weight. She is not ill-appearing, toxic-appearing or diaphoretic.  HENT:     Head: Normocephalic.     Right Ear: External ear normal. A middle ear effusion is present.     Left Ear: External ear normal. A middle ear effusion is present.     Nose: Congestion and rhinorrhea present.     Mouth/Throat:     Mouth: Mucous membranes are moist.     Pharynx: Oropharynx is clear. Posterior oropharyngeal erythema present. No oropharyngeal exudate.  Eyes:     General:        Right eye: No discharge.        Left eye: No discharge.     Extraocular Movements: Extraocular  movements intact.     Conjunctiva/sclera: Conjunctivae normal.     Pupils: Pupils are equal, round, and reactive to light.  Cardiovascular:     Rate and Rhythm: Normal rate and regular rhythm.     Heart sounds: No murmur heard. Pulmonary:     Effort: Pulmonary effort is normal. No respiratory distress.     Breath sounds: Wheezing present. No rales.  Musculoskeletal:     Cervical back: Normal range of motion and neck supple.  Skin:    General: Skin is warm and dry.     Capillary Refill: Capillary refill takes less than 2 seconds.  Neurological:     General: No focal deficit present.     Mental Status: She is alert and oriented to person, place, and time. Mental status is at baseline.  Psychiatric:        Mood and Affect:  Mood normal.        Behavior: Behavior normal.        Thought Content: Thought content normal.        Judgment: Judgment normal.     Results for orders placed or performed in visit on 07/12/24  Basic metabolic panel with GFR   Collection Time: 07/12/24 11:16 AM  Result Value Ref Range   Glucose 161 (H) 70 - 99 mg/dL   BUN 14 8 - 27 mg/dL   Creatinine, Ser 9.00 0.57 - 1.00 mg/dL   eGFR 60 >40 fO/fpw/8.26   BUN/Creatinine Ratio 14 12 - 28   Sodium 137 134 - 144 mmol/L   Potassium 5.0 3.5 - 5.2 mmol/L   Chloride 97 96 - 106 mmol/L   CO2 25 20 - 29 mmol/L   Calcium  9.5 8.7 - 10.3 mg/dL   *Note: Due to a large number of results and/or encounters for the requested time period, some results have not been displayed. A complete set of results can be found in Results Review.      Assessment & Plan:   Problem List Items Addressed This Visit   None Visit Diagnoses       Acute cough    -  Primary   Will treat with Augmentin  due to history of pneumonia. Will avoid prednisone  due to adverse reaction.  FLu and COVID neg in office.  Follow up if not improved.   Relevant Orders   Veritor Flu A/B Waived     Shortness of breath       Relevant Orders   POC COVID-19        Follow up plan: No follow-ups on file.      "

## 2024-08-24 ENCOUNTER — Telehealth: Payer: Self-pay

## 2024-08-24 NOTE — Telephone Encounter (Signed)
 Informed patient we were not able to see her this week as medication will not get here on time.  Pain pump refill appt made for 09/01/24 for 1pm. Patient made aware.

## 2024-08-24 NOTE — Telephone Encounter (Signed)
 Called patient to ask if medications were to come in today or tomorrow, could she come in to clinic? Patient states yes she could any day or time. Informed patient I will follow-up with her to see if pharmacy can have medication for intrathecal pump refill ready sooner.

## 2024-08-30 ENCOUNTER — Encounter: Admitting: Pain Medicine

## 2024-09-01 ENCOUNTER — Other Ambulatory Visit

## 2024-09-01 ENCOUNTER — Ambulatory Visit: Attending: Pain Medicine | Admitting: Pain Medicine

## 2024-09-01 ENCOUNTER — Encounter: Payer: Self-pay | Admitting: Pain Medicine

## 2024-09-01 VITALS — BP 123/68 | HR 88 | Temp 98.2°F | Resp 16 | Ht 58.5 in | Wt 147.0 lb

## 2024-09-01 DIAGNOSIS — Z79891 Long term (current) use of opiate analgesic: Secondary | ICD-10-CM | POA: Diagnosis present

## 2024-09-01 DIAGNOSIS — M79605 Pain in left leg: Secondary | ICD-10-CM | POA: Diagnosis present

## 2024-09-01 DIAGNOSIS — M51372 Other intervertebral disc degeneration, lumbosacral region with discogenic back pain and lower extremity pain: Secondary | ICD-10-CM | POA: Insufficient documentation

## 2024-09-01 DIAGNOSIS — M79604 Pain in right leg: Secondary | ICD-10-CM | POA: Insufficient documentation

## 2024-09-01 DIAGNOSIS — M5410 Radiculopathy, site unspecified: Secondary | ICD-10-CM | POA: Insufficient documentation

## 2024-09-01 DIAGNOSIS — M5459 Other low back pain: Secondary | ICD-10-CM | POA: Insufficient documentation

## 2024-09-01 DIAGNOSIS — M545 Low back pain, unspecified: Secondary | ICD-10-CM | POA: Diagnosis present

## 2024-09-01 DIAGNOSIS — E1142 Type 2 diabetes mellitus with diabetic polyneuropathy: Secondary | ICD-10-CM | POA: Diagnosis present

## 2024-09-01 DIAGNOSIS — Z978 Presence of other specified devices: Secondary | ICD-10-CM | POA: Insufficient documentation

## 2024-09-01 DIAGNOSIS — Z451 Encounter for adjustment and management of infusion pump: Secondary | ICD-10-CM | POA: Insufficient documentation

## 2024-09-01 DIAGNOSIS — G8929 Other chronic pain: Secondary | ICD-10-CM | POA: Diagnosis present

## 2024-09-01 DIAGNOSIS — G894 Chronic pain syndrome: Secondary | ICD-10-CM | POA: Insufficient documentation

## 2024-09-01 MED ORDER — NALOXONE HCL 4 MG/0.1ML NA LIQD: 1.0000 | NASAL | 1 refills | Status: AC | PRN

## 2024-09-01 NOTE — Patient Instructions (Addendum)
 " ______________________________________________________________________    Opioid Pain Medication Update  To: All patients taking opioid pain medications. (I.e.: hydrocodone , hydromorphone , oxycodone, oxymorphone, morphine , codeine, methadone, tapentadol, tramadol, buprenorphine, fentanyl , etc.)  Re: Updated review of side effects and adverse reactions of opioid analgesics, as well as new information about long term effects of this class of medications.  Direct risks of long-term opioid therapy are not limited to opioid addiction and overdose. Potential medical risks include serious fractures, breathing problems during sleep, hyperalgesia, immunosuppression, chronic constipation, bowel obstruction, myocardial infarction, and tooth decay secondary to xerostomia.  Unpredictable adverse effects that can occur even if you take your medication correctly: Cognitive impairment, respiratory depression, and death. Most people think that if they take their medication correctly, and as instructed, that they will be safe. Nothing could be farther from the truth. In reality, a significant amount of recorded deaths associated with the use of opioids has occurred in individuals that had taken the medication for a long time, and were taking their medication correctly. The following are examples of how this can happen: Patient taking his/her medication for a long time, as instructed, without any side effects, is given a certain antibiotic or another unrelated medication, which in turn triggers a Drug-to-drug interaction leading to disorientation, cognitive impairment, impaired reflexes, respiratory depression or an untoward event leading to serious bodily harm or injury, including death.  Patient taking his/her medication for a long time, as instructed, without any side effects, develops an acute impairment of liver and/or kidney function. This will lead to a rapid inability of the body to breakdown and eliminate  their pain medication, which will result in effects similar to an overdose, but with the same medicine and dose that they had always taken. This again may lead to disorientation, cognitive impairment, impaired reflexes, respiratory depression or an untoward event leading to serious bodily harm or injury, including death.  A similar problem will occur with patients as they grow older and their liver and kidney function begins to decrease as part of the aging process.  Background information: Historically, the original case for using long-term opioid therapy to treat chronic noncancer pain was based on safety assumptions that subsequent experience has called into question. In 1996, the American Pain Society and the American Academy of Pain Medicine issued a consensus statement supporting long-term opioid therapy. This statement acknowledged the dangers of opioid prescribing but concluded that the risk for addiction was low; respiratory depression induced by opioids was short-lived, occurred mainly in opioid-naive patients, and was antagonized by pain; tolerance was not a common problem; and efforts to control diversion should not constrain opioid prescribing. This has now proven to be wrong. Experience regarding the risks for opioid addiction, misuse, and overdose in community practice has failed to support these assumptions.  According to the Centers for Disease Control and Prevention, fatal overdoses involving opioid analgesics have increased sharply over the past decade. Currently, more than 96,700 people die from drug overdoses every year. Opioids are a factor in 7 out of every 10 overdose deaths. Deaths from drug overdose have surpassed motor vehicle accidents as the leading cause of death for individuals between the ages of 68 and 96.  Clinical data suggest that neuroendocrine dysfunction may be very common in both men and women, potentially causing hypogonadism, erectile dysfunction, infertility,  decreased libido, osteoporosis, and depression. Recent studies linked higher opioid dose to increased opioid-related mortality. Controlled observational studies reported that long-term opioid therapy may be associated with increased risk for cardiovascular events.  Subsequent meta-analysis concluded that the safety of long-term opioid therapy in elderly patients has not been proven.   Side Effects and adverse reactions: Common side effects: Drowsiness (sedation). Dizziness. Nausea and vomiting. Constipation. Physical dependence -- Dependence often manifests with withdrawal symptoms when opioids are discontinued or decreased. Tolerance -- As you take repeated doses of opioids, you require increased medication to experience the same effect of pain relief. Respiratory depression -- This can occur in healthy people, especially with higher doses. However, people with COPD, asthma or other lung conditions may be even more susceptible to fatal respiratory impairment.  Uncommon side effects: An increased sensitivity to feeling pain and extreme response to pain (hyperalgesia). Chronic use of opioids can lead to this. Delayed gastric emptying (the process by which the contents of your stomach are moved into your small intestine). Muscle rigidity. Immune system and hormonal dysfunction. Quick, involuntary muscle jerks (myoclonus). Arrhythmia. Itchy skin (pruritus). Dry mouth (xerostomia).  Long-term side effects: Chronic constipation. Sleep-disordered breathing (SDB). Increased risk of bone fractures. Hypothalamic-pituitary-adrenal dysregulation. Increased risk of overdose.  RISKS: Respiratory depression and death: Opioids increase the risk of respiratory depression and death.  Drug-to-drug interactions: Opioids are relatively contraindicated in combination with benzodiazepines, sleep inducers, and other central nervous system depressants. Other classes of medications (i.e.: certain antibiotics  and even over-the-counter medications) may also trigger or induce respiratory depression in some patients.  Medical conditions: Patients with pre-existing respiratory problems are at higher risk of respiratory failure and/or depression when in combination with opioid analgesics. Opioids are relatively contraindicated in some medical conditions such as central sleep apnea.   Fractures and Falls:  Opioids increase the risk and incidence of falls. This is of particular importance in elderly patients.  Endocrine System:  Long-term administration is associated with endocrine abnormalities (endocrinopathies). (Also known as Opioid-induced Endocrinopathy) Influences on both the hypothalamic-pituitary-adrenal axis?and the hypothalamic-pituitary-gonadal axis have been demonstrated with consequent hypogonadism and adrenal insufficiency in both sexes. Hypogonadism and decreased levels of dehydroepiandrosterone sulfate have been reported in men and women. Endocrine effects include: Amenorrhoea in women (abnormal absence of menstruation) Reduced libido in both sexes Decreased sexual function Erectile dysfunction in men Hypogonadisms (decreased testicular function with shrinkage of testicles) Infertility Depression and fatigue Loss of muscle mass Anxiety Depression Immune suppression Hyperalgesia Weight gain Anemia Osteoporosis Patients (particularly women of childbearing age) should avoid opioids. There is insufficient evidence to recommend routine monitoring of asymptomatic patients taking opioids in the long-term for hormonal deficiencies.  Immune System: Human studies have demonstrated that opioids have an immunomodulating effect. These effects are mediated via opioid receptors both on immune effector cells and in the central nervous system. Opioids have been demonstrated to have adverse effects on antimicrobial response and anti-tumour surveillance. Buprenorphine has been demonstrated to have  no impact on immune function.  Opioid Induced Hyperalgesia: Human studies have demonstrated that prolonged use of opioids can lead to a state of abnormal pain sensitivity, sometimes called opioid induced hyperalgesia (OIH). Opioid induced hyperalgesia is not usually seen in the absence of tolerance to opioid analgesia. Clinically, hyperalgesia may be diagnosed if the patient on long-term opioid therapy presents with increased pain. This might be qualitatively and anatomically distinct from pain related to disease progression or to breakthrough pain resulting from development of opioid tolerance. Pain associated with hyperalgesia tends to be more diffuse than the pre-existing pain and less defined in quality. Management of opioid induced hyperalgesia requires opioid dose reduction.  Cancer: Chronic opioid therapy has been associated with an increased  risk of cancer among noncancer patients with chronic pain. This association was more evident in chronic strong opioid users. Chronic opioid consumption causes significant pathological changes in the small intestine and colon. Epidemiological studies have found that there is a link between opium  dependence and initiation of gastrointestinal cancers. Cancer is the second leading cause of death after cardiovascular disease. Chronic use of opioids can cause multiple conditions such as GERD, immunosuppression and renal damage as well as carcinogenic effects, which are associated with the incidence of cancers.   Mortality: Long-term opioid use has been associated with increased mortality among patients with chronic non-cancer pain (CNCP).  Prescription of long-acting opioids for chronic noncancer pain was associated with a significantly increased risk of all-cause mortality, including deaths from causes other than overdose.  Reference: Von Korff M, Kolodny A, Deyo RA, Chou R. Long-term opioid therapy reconsidered. Ann Intern Med. 2011 Sep 6;155(5):325-8. doi:  10.7326/[id number]. PMID: 78106373; PMCID: [id number]. Kit JINNY Laurence CINDERELLA Pearley JINNY, Hayward RA, Dunn KM, Jordan KP. Risk of adverse events in patients prescribed long-term opioids: A cohort study in the [place] Clinical [employer]. Eur J Pain. 2019 May;23(5):908-922. doi: 10.1002/ejp.1357. Epub 2019 Jan 31. PMID: 69379883. Colameco S, Coren JS, Ciervo CA. Continuous opioid treatment for chronic noncancer pain: a time for moderation in prescribing. Postgrad Med. 2009 Jul;121(4):61-6. doi: 10.3810/pgm.2009.07.2032. PMID: 80358728. Gigi JONELLE Shlomo MILUS Levern IVER Conny RN, Baker SD, Blazina I, Lonell DASEN, Bougatsos C, Deyo RA. The effectiveness and risks of long-term opioid therapy for chronic pain: a systematic review for a [employer] of [employer]ways to [employer]. Ann Intern Med. 2015 Feb 17;162(4):276-86. doi: 10.7326/M14-2559. PMID: 74418742. Rory CHRISTELLA Laurence [employer], Makuc DM. NCHS Data Brief No. 22. [place]: Centers for Disease Control and Prevention; 2009. Sep, Increase in Fatal Poisonings Involving Opioid Analgesics in the [place] , 1999-2006. Song IA, Choi HR, Oh TK. Long-term opioid use and mortality in patients with chronic non-cancer pain: Ten-year follow-up study in [place] from 2010 through 2019. EClinicalMedicine. 2022 Jul 18;51:101558. doi: 10.1016/j.eclinm.2022.898441. PMID: 64124182; PMCID: EFR0695089. Huser, W., Schubert, T., Vogelmann, T. et al. All-cause mortality in patients with long-term opioid therapy compared with non-opioid analgesics for chronic non-cancer pain: a database study. BMC Med 18, 162 (2020). http://lester.info/ Rashidian H, Zendehdel K, Kamangar F, Malekzadeh R, Haghdoost AA. An Ecological Study of the Association between Opiate Use and Incidence of Cancers. Addict Health. 2016 Fall;8(4):252-260. PMID: 71180443; PMCID: [id number].  Our Goal: Our goal is to control your pain with means other  than the use of opioid pain medications.  Our Recommendation: Talk to your physician about coming off of these medications. We can assist you with the tapering down and stopping these medicines. Based on the new information, even if you cannot completely stop the medication, a decrease in the dose may be associated with a lesser risk. Ask for other means of controlling the pain. Decrease or eliminate those factors that significantly contribute to your pain such as smoking, obesity, and a diet heavily tilted towards inflammatory nutrients.  Last Updated: 02/09/2023   ______________________________________________________________________       ______________________________________________________________________     Naloxone  Nasal Spray  Why am I receiving this medication? [place]  STOP ACT requires that all patients taking high dose opioids or at risk of opioids respiratory depression, be prescribed an opioid reversal agent, such as Naloxone  (AKA: Narcan ).  What is this medication? NALOXONE  (nal OX one) treats opioid overdose, which causes slow or shallow breathing, severe drowsiness, or trouble staying  awake. Call emergency services after using this medication. You may need additional treatment. Naloxone  works by reversing the effects of opioids. It belongs to a group of medications called opioid blockers.  COMMON BRAND NAME(S): Kloxxado , Narcan   What should I tell my care team before I take this medication? They need to know if you have any of these conditions: Heart disease Substance use disorder An unusual or allergic reaction to naloxone , other medications, foods, dyes, or preservatives Pregnant or trying to get pregnant Breast-feeding  When to use this medication? This medication is to be used for the treatment of respiratory depression (less than 8 breaths per minute) secondary to opioid overdose.   How to use this medication? This medication is for use in the  nose. Lay the person on their back. Support their neck with your hand and allow the head to tilt back before giving the medication. The nasal spray should be given into 1 nostril. After giving the medication, move the person onto their side. Do not remove or test the nasal spray until ready to use. Get emergency medical help right away after giving the first dose of this medication, even if the person wakes up. You should be familiar with how to recognize the signs and symptoms of a narcotic overdose. If more doses are needed, give the additional dose in the other nostril. Talk to your care team about the use of this medication in children. While this medication may be prescribed for children as young as newborns for selected conditions, precautions do apply.  Naloxone  Overdosage: If you think you have taken too much of this medicine contact a poison control center or emergency room at once.  NOTE: This medicine is only for you. Do not share this medicine with others.  What if I miss a dose? This does not apply.  What may interact with this medication? This is only used during an emergency. No interactions are expected during emergency use. This list may not describe all possible interactions. Give your health care provider a list of all the medicines, herbs, non-prescription drugs, or dietary supplements you use. Also tell them if you smoke, drink alcohol , or use illegal drugs. Some items may interact with your medicine.  What should I watch for while using this medication? Keep this medication ready for use in the case of an opioid overdose. Make sure that you have the phone number of your care team and local hospital ready. You may need to have additional doses of this medication. Each nasal spray contains a single dose. Some emergencies may require additional doses. After use, bring the treated person to the nearest hospital or call 911. Make sure the treating care team knows that the person has  received a dose of this medication. You will receive additional instructions on what to do during and after use of this medication before an emergency occurs.  What side effects may I notice from receiving this medication? Side effects that you should report to your care team as soon as possible: Allergic reactions--skin rash, itching, hives, swelling of the face, lips, tongue, or throat Side effects that usually do not require medical attention (report these to your care team if they continue or are bothersome): Constipation Dryness or irritation inside the nose Headache Increase in blood pressure Muscle spasms Stuffy nose Toothache This list may not describe all possible side effects. Call your doctor for medical advice about side effects. You may report side effects to FDA at 1-800-FDA-1088.  Where should  I keep my medication? Because this is an emergency medication, you should keep it with you at all times.  Keep out of the reach of children and pets. Store between 20 and 25 degrees C (68 and 77 degrees F). Do not freeze. Throw away any unused medication after the expiration date. Keep in original box until ready to use.  NOTE: This sheet is a summary. It may not cover all possible information. If you have questions about this medicine, talk to your doctor, pharmacist, or health care provider.   2023 Elsevier/Gold Standard (2021-03-29 00:00:00)  ______________________________________________________________________     Opioid Overdose Opioids are drugs that are often used to treat pain. Opioids include illegal drugs, such as heroin, as well as prescription pain medicines, such as codeine, morphine , hydrocodone , and fentanyl . An opioid overdose happens when you take too much of an opioid. An overdose may be intentional or accidental and can happen with any type of opioid. The effects of an overdose can be mild, dangerous, or even deadly. Opioid overdose is a medical emergency. What  are the causes? This condition may be caused by: Taking too much of an opioid on purpose. Taking too much of an opioid by accident. Using two or more substances that contain opioids at the same time. Taking an opioid with a substance that affects your heart, breathing, or blood pressure. These include alcohol , tranquilizers, sleeping pills, illegal drugs, and some over-the-counter medicines. This condition may also happen due to an error made by: A health care provider who prescribes a medicine. The pharmacist who fills the prescription. What increases the risk? This condition is more likely in: Children. They may be attracted to colorful pills. Because of a child's small size, even a small amount of a medicine can be dangerous. Older people. They may be taking many different medicines. Older people may have difficulty reading labels or remembering when they last took their medicines. They may also be more sensitive to the effects of opioids. People with chronic medical conditions, especially heart, liver, kidney, or neurological diseases. People who take an opioid for a long period of time. People who take opioids and use illegal drugs, such as heroin, or other substances, such as alcohol . People who: Have a history of drug or alcohol  abuse. Have certain mental health conditions. Have a history of previous drug overdoses. People who take opioids that are not prescribed for them. What are the signs or symptoms? Symptoms of this condition depend on the type of opioid and the amount that was taken. Common symptoms include: Sleepiness or difficulty waking from sleep. Confusion. Slurred speech. Slowed breathing and a slow pulse (bradycardia). Nausea and vomiting. Abnormally small pupils. Signs and symptoms that require emergency treatment include: Cold, clammy, and pale skin. Blue lips and fingernails. Vomiting. Gurgling sounds in the throat. A pulse that is very slow or difficult to  detect. Breathing that is very irregular, slow, noisy, or difficult to detect. Inability to respond to speech or be awakened from sleep (stupor). Seizures. How is this diagnosed? This condition is diagnosed based on your symptoms and medical history. It is important to tell your health care provider: About all of the opioids that you took. When you took the opioids. Whether you were drinking alcohol  or using marijuana, cocaine, or other drugs. Your health care provider will do a physical exam. This exam may include: Checking and monitoring your heart rate and rhythm, breathing rate, temperature, and blood pressure. Measuring oxygen  levels in your blood. Checking for  abnormally small pupils. You may also have blood tests or urine tests. You may have X-rays if you are having severe breathing problems. How is this treated? This condition requires immediate medical treatment and hospitalization. Reversing the effects of the opioid is the first step in treatment. If you have a Narcan  kit or naloxone , use it right away. Follow your health care provider's instructions. A friend or family member can also help you with this. The rest of your treatment will be given in the hospital intensive care (ICU). Treatment in the hospital may include: Giving salts and minerals (electrolytes) along with fluids through an IV. Inserting a breathing tube (endotracheal tube) in your airway to help you breathe if you cannot breathe on your own or you are in danger of not being able to breathe on your own. Giving oxygen  through a small tube under your nose. Passing a tube through your nose and into your stomach (nasogastric tube, or NG tube) to empty your stomach. Giving medicines that: Increase your blood pressure. Relieve nausea and vomiting. Relieve abdominal pain and cramping. Reverse the effects of the opioid (naloxone ). Monitoring your heart and oxygen  levels. Ongoing counseling and mental health support if  you intentionally overdosed or used an illegal drug. Follow these instructions at home:  Medicines Take over-the-counter and prescription medicines only as told by your health care provider. Always ask your health care provider about possible side effects and interactions of any new medicine that you start taking. Keep a list of all the medicines that you take, including over-the-counter medicines. Bring this list with you to all your medical visits. General instructions Drink enough fluid to keep your urine pale yellow. Keep all follow-up visits. This is important. How is this prevented? Read the drug inserts that come with your opioid pain medicines. Take medicines only as told by your health care provider. Do not take more medicine than you are told. Do not take medicines more frequently than you are told. Do not drink alcohol  or take sedatives when taking opioids. Do not use illegal or recreational drugs, including cocaine, ecstasy, and marijuana. Do not take opioid medicines that are not prescribed for you. Store all medicines in safety containers that are out of the reach of children. Get help if you are struggling with: Alcohol  or drug use. Depression or another mental health problem. Thoughts of hurting yourself or another person. Keep the phone number of your local poison control center near your phone or in your mobile phone. In the [place] the hotline of the [facility] is 5200773619. If you were prescribed naloxone , make sure you understand how to take it. Contact a health care provider if: You need help understanding how to take your pain medicines. You feel your medicines are too strong. You are concerned that your pain medicines are not working well for your pain. You develop new symptoms or side effects when you are taking medicines. Get help right away if: You or someone else is having symptoms of an opioid overdose. Get help even if you are not  sure. You have thoughts about hurting yourself or others. You have: Chest pain. Difficulty breathing. A loss of consciousness. These symptoms may represent a serious problem that is an emergency. Do not wait to see if the symptoms will go away. Get medical help right away. Call your local emergency services (911 in the [place] Do not drive yourself to the hospital. If you ever feel like you may hurt yourself or  others, or have thoughts about taking your own life, get help right away. You can go to your nearest emergency department or: Call your local emergency services (911 in the [place] Call a suicide crisis helpline, such as the National Suicide Prevention Lifeline at (217)713-0730 or 988 in the [place] This is open 24 hours a day in the [place] If youre a Veteran: Call 988 and press 1. This is open 24 hours a day. Text the [employer] at [phone]. Summary Opioids are drugs that are often used to treat pain. Opioids include illegal drugs, such as heroin, as well as prescription pain medicines. An opioid overdose happens when you take too much of an opioid. Overdoses can be intentional or accidental. Opioid overdose is very dangerous. It is a life-threatening emergency. If you or someone you know is experiencing an opioid overdose, get help right away. This information is not intended to replace advice given to you by your health care provider. Make sure you discuss any questions you have with your health care provider. Document Revised: 04/27/2023 Document Reviewed: 10/31/2020 Elsevier Patient Education  2024 [employer]. "

## 2024-09-01 NOTE — Progress Notes (Signed)
 Safety precautions to be maintained throughout the outpatient stay will include: orient to surroundings, keep bed in low position, maintain call bell within reach at all times, provide assistance with transfer out of bed and ambulation.

## 2024-09-01 NOTE — Progress Notes (Signed)
 PROVIDER NOTE: Interpretation of information contained herein should be left to medically-trained personnel. Specific patient instructions are provided elsewhere under Patient Instructions section of medical record. This document was created in part using STT-dictation technology, any transcriptional errors that may result from this process are unintentional.  Patient: Jordan Chan Type: Established DOB: June 14, 1951 MRN: [id number] PCP: [name], NP  Service: Procedure DOS: 09/01/2024 Setting: Ambulatory Location: Ambulatory outpatient facility Delivery: Face-to-face Provider: Eric DELENA Como, MD Specialty: Interventional Pain Management Specialty designation: 09 Location: Outpatient facility Ref. Prov.: [name], Jolene T, NP       Interventional Therapy   Primary Reason for Visit: Interventional Pain Management Treatment. CC: Back Pain (Bilateral lumbar )  Procedure:          Type: Management of Intrathecal Drug Delivery System (IDDS) - Reservoir Refill (04009). No rate change.  Indications: 1. Chronic pain syndrome   2. Chronic low back pain (1ry area of Pain) (Bilateral) (R>L) w/o sciatica   3. DDD (degenerative disc disease), lumbosacral w/ LBP & LEP   4. Low back pain of over 3 months duration   5. Intractable low back pain   6. Multifactorial low back pain   7. Chronic lower extremity pain (2ry area of Pain) (Bilateral) (R>L)   8. Chronic radicular pain of lower extremity   9. Diabetic peripheral neuropathy (HCC)   10. Presence of intrathecal pump   11. Encounter for adjustment and management of infusion pump   12. Encounter for chronic pain management   13. Chronic use of opiate for therapeutic purpose    Pain Assessment: Self-Reported Pain Score: 6 /10             Reported level is compatible with observation.          IDDS (Intrathecal Drug Delivery System):  Reservoir  Battery:  Manufacturer: Medtronic  Model: Synchromed III Model No.: [id number]   Serial No.: [id number]  Delivery Route: Intrathecal  Type: Programmable  Volume Capacity (mL): 40 mL reservoir  Priming Volume: N/A  Calibration Constant: 111  MRI compatibility: Conditional  Implant Site: Abdominal (RLQ)  Laterality: Right   Catheter: Manufacturer: Medtronic Model:  InDura catheter Model No.: 8709 Lot No.: N/A  Implanted Length (cm): 77.5 cm  Catheter Volume (mL): 0.171 mL  Delivery Route: Intrathecal  Canal Access Site: T1-2  Tip Level: T7 Tip Location: Posterior Tip Laterality: Right  Implant Details:  Initial Implant Date: 05/27/2017 Last Replacement Date: 02/11/2024 Initial Implanter: Lonni Charleston, MD [phone]; [facility], [place], [place]. Last Implanter: Renesme Kerrigan A. Como, MD [phone]; [facility], [place], [place]. Delivery Route: Intrathecal Site: Abdominal Laterality:  Right   Additional equipment: TYRX Antibacterial envelope (Minocycline/rifampin)(REF: [id number]  LOT: [id number]  ED: 2024/10/01)   Initial Content:  1ry Medication Class: Preservative-Free Opioid 1ry Medication (Concentration): PF-Morphine  (5.0 mg/mL) 2ry Medication Class: Preservative-Free Local Anesthetic 2ry Medication (Concentration): PF-Bupivacaine  (20.0 mg/mL) 3ry Medication Class: Preservative-Free Adjuvant 3ry Medication (Concentration): PF-Clonidine  (100.0 mcg/mL)  PTM parameters:  Mode: Off/Inactive Bolus Dose: N/A Duration: N/A (min) Max. No.: N/A (bolus/day). Lock-out time (interval): N/A (hrs) Max. Daily bolus dose: N/A (mg/day) Total max. Daily dose (basal + bolus): N/A (mg/day)  Programming:  Type: Simple continuous.  Medication, Concentration, Infusion Program, & Delivery Rate: For up-to-date details please see most recent scanned programming printout.   Changes:  Medication Change: None at this point Rate Change: No change in  rate  Reported side-effects or adverse reactions:  None reported  Effectiveness: Described as relatively effective, allowing for increase in activities of daily living (ADL) Clinically meaningful improvement in function (CMIF): Sustained CMIF goals met  Plan: Pump refill today   Pharmacotherapy Assessment  Analgesic: morphine  sulfate powder (for intrathecal pump use)  MME/day: 2.5 mg/day (Intrathecal)   Monitoring: [place] PMP: PDMP not reviewed this encounter.       Pharmacotherapy: No side-effects or adverse reactions reported. Compliance: No problems identified. Effectiveness: Clinically acceptable. Plan: Refer to POC.  UDS:  Summary  Date Value Ref Range Status  06/17/2023 Note  Final    Comment:    ==================================================================== Compliance Drug Analysis, Ur ==================================================================== Test                             Result       Flag       Units  Drug Present and Declared for Prescription Verification   Gabapentin                      PRESENT      EXPECTED   Venlafaxine                     PRESENT      EXPECTED   Desmethylvenlafaxine           PRESENT      EXPECTED    Desmethylvenlafaxine is an expected metabolite of venlafaxine .    Metoprolol                      PRESENT      EXPECTED  Drug Present not Declared for Prescription Verification   Morphine                        1930         UNEXPECTED ng/mg creat    Potential sources of morphine  include administration of codeine or    morphine , use of heroin, or ingestion of poppy seeds.  Drug Absent but Declared for Prescription Verification   Hydrocodone                     Not Detected UNEXPECTED ng/mg creat   Cyclobenzaprine                 Not Detected UNEXPECTED   Acetaminophen                   Not Detected UNEXPECTED    Acetaminophen , as indicated in the declared medication list, is not    always detected even when used as  directed.  ==================================================================== Test                      Result    Flag   Units      Ref Range   Creatinine              97               mg/dL      >=79 ==================================================================== Declared Medications:  The flagging and interpretation on this report are based on the  following declared medications.  Unexpected results may arise from  inaccuracies in the declared medications.   **Note: The testing scope of this panel includes these medications:   Cyclobenzaprine  (Flexeril )  Gabapentin   Hydrocodone  (Norco)  Metoprolol  (Toprol )  Venlafaxine  (Effexor )   **Note: The testing scope of this panel  does not include small to  moderate amounts of these reported medications:   Acetaminophen  (Norco)   **Note: The testing scope of this panel does not include the  following reported medications:   Albuterol   Apixaban  (Eliquis )  Atorvastatin  (Lipitor)  Buspirone  (Buspar )  Dulaglutide  (Trulicity )  Fluticasone  (Trelegy)  Folic Acid   Furosemide  (Lasix )  Insulin  (Tresiba )  Losartan  (Cozaar )  Metformin   Montelukast   Ondansetron  (Zofran )  Pantoprazole  (Protonix )  Umeclidinium (Trelegy)  Vilanterol (Trelegy)  Vitamin B12  Vitamin D3 ==================================================================== For clinical consultation, please call [phone]. ====================================================================    No results found for: CBDTHCR, D8THCCBX, D9THCCBX  H&P (Pre-op Assessment):  Ms. Kessinger is a 74 y.o. (year old), female patient, seen today for interventional treatment. She  has a past surgical history that includes Abdominal hysterectomy (1987); Elbow arthroscopy with tendon reconstruction; morphine  pump (2011); Plantar fascia release; Esophagogastroduodenoscopy (egd) with propofol  (N/A, 01/01/2017); Colonoscopy with propofol  (N/A, 01/01/2017); LEFT HEART CATH AND  CORONARY ANGIOGRAPHY (N/A, 04/28/2017); Mastectomy (Right, 06/1997); Back surgery; Breast surgery (Right); Cardiac catheterization; Trigger finger release; Cataract extraction w/PHACO (Right, 05/19/2019); Intrathecal pump implant; Coccyx removal; Cataract extraction w/PHACO (Left, 06/16/2019); Colonoscopy with propofol  (N/A, 12/11/2021); Esophagogastroduodenoscopy (egd) with propofol  (N/A, 12/11/2021); Total hip arthroplasty (Left); and Pain pump revision (N/A, 02/11/2024). Ms. Majkowski has a current medication list which includes the following prescription(s): accu-chek aviva plus, accu-chek softclix lancets, albuterol , albuterol , amoxicillin -clavulanate, apixaban , atorvastatin , true metrix meter, buspirone , cholecalciferol , freestyle libre 2 reader, freestyle libre 2 sensor, cyanocobalamin , cyclobenzaprine , dapagliflozin  propanediol, docusate sodium , trulicity , entresto , folic acid , furosemide , gabapentin , insulin  detemir, insulin  lispro, metformin , metoprolol  succinate, montelukast , naloxone , easy air compressor nebulizer, ondansetron , oxygen -helium, PAIN MANAGEMENT INTRATHECAL, IT, PUMP, pantoprazole , trelegy ellipta , tresiba  flextouch, and venlafaxine  xr, and the following Facility-Administered Medications: cyanocobalamin . Her primarily concern today is the Back Pain (Bilateral lumbar )  Initial Vital Signs:  Pulse/HCG Rate: 88  Temp: 98.2 F (36.8 C) Resp: 16 BP: 123/68 SpO2: 100 % (2 liters)  BMI: Estimated body mass index is 30.2 kg/m as calculated from the following:   Height as of this encounter: 4' 10.5 (1.486 m).   Weight as of this encounter: 147 lb (66.7 kg).  Risk Assessment: Allergies: Reviewed. She is allergic to other, pain patch [menthol], jardiance  [empagliflozin ], prednisone , avelox  [moxifloxacin  hcl in nacl], doxycycline , erythromycin , fentanyl , moxifloxacin  hcl, oxycontin [oxycodone], and ozempic [semaglutide].  Allergy Precautions: None required Coagulopathies: Reviewed.  None identified.  Blood-thinner therapy: None at this time Active Infection(s): Reviewed. None identified. Ms. Lapaglia is afebrile  Site Confirmation: Ms. Amescua was asked to confirm the procedure and laterality before marking the site Procedure checklist: Completed Consent: Before the procedure and under the influence of no sedative(s), amnesic(s), or anxiolytics, the patient was informed of the treatment options, risks and possible complications. To fulfill our ethical and legal obligations, as recommended by the American Medical Association's Code of Ethics, I have informed the patient of my clinical impression; the nature and purpose of the treatment or procedure; the risks, benefits, and possible complications of the intervention; the alternatives, including doing nothing; the risk(s) and benefit(s) of the alternative treatment(s) or procedure(s); and the risk(s) and benefit(s) of doing nothing.  Ms. Desa was provided with information about the general risks and possible complications associated with most interventional procedures. These include, but are not limited to: failure to achieve desired goals, infection, bleeding, organ or nerve damage, allergic reactions, paralysis, and/or death.  In addition, she was informed of those risks and possible complications associated to this particular procedure, which include, but  are not limited to: damage to the implant; failure to decrease pain; local, systemic, or serious CNS infections, intraspinal abscess with possible cord compression and paralysis, or life-threatening such as meningitis; bleeding; organ damage; nerve injury or damage with subsequent sensory, motor, and/or autonomic system dysfunction, resulting in transient or permanent pain, numbness, and/or weakness of one or several areas of the body; allergic reactions, either minor or major life-threatening, such as anaphylactic or anaphylactoid reactions.  Furthermore, Ms. Space was  informed of those risks and complications associated with the medications. These include, but are not limited to: allergic reactions (i.e.: anaphylactic or anaphylactoid reactions); endorphine suppression; bradycardia and/or hypotension; water  retention and/or peripheral vascular relaxation leading to lower extremity edema and possible stasis ulcers; respiratory depression and/or shortness of breath; decreased metabolic rate leading to weight gain; swelling or edema; medication-induced neural toxicity; particulate matter embolism and blood vessel occlusion with resultant organ, and/or nervous system infarction; and/or intrathecal granuloma formation with possible spinal cord compression and permanent paralysis.  Before refilling the pump Ms. Laufer was informed that some of the medications used in the devise may not be FDA approved for such use and therefore it constitutes an off-label use of the medications.  Finally, she was informed that Medicine is not an exact science; therefore, there is also the possibility of unforeseen or unpredictable risks and/or possible complications that may result in a catastrophic outcome. The patient indicated having understood very clearly. We have given the patient no guarantees and we have made no promises. Enough time was given to the patient to ask questions, all of which were answered to the patient's satisfaction. Ms. Cisse has indicated that she wanted to continue with the procedure. Attestation: I, the ordering provider, attest that I have discussed with the patient the benefits, risks, side-effects, alternatives, likelihood of achieving goals, and potential problems during recovery for the procedure that I have provided informed consent. Date  Time: 09/01/2024 12:47 PM  Pre-Procedure Preparation:  Monitoring: As per clinic protocol. Respiration, ETCO2, SpO2, BP, heart rate and rhythm monitor placed and checked for adequate function Safety Precautions:  Patient was assessed for positional comfort and pressure points before starting the procedure. Time-out: I initiated and conducted the Time-out before starting the procedure, as per protocol. The patient was asked to participate by confirming the accuracy of the Time Out information. Verification of the correct person, site, and procedure were performed and confirmed by me, the nursing staff, and the patient. Time-out conducted as per Joint Commission's Universal Protocol (UP.01.01.01). Time: 1243 Start Time: 1243 hrs.  Description of Procedure:          Position: Supine Target Area: Central-port of intrathecal pump. Approach: Anterior, 90 degree angle approach. Area Prepped: Entire Area around the pump implant. ChloraPrep (2% chlorhexidine  gluconate and 70% isopropyl alcohol ) Safety Precautions: Aspiration looking for blood return was conducted prior to all injections. At no point did we inject any substances, as a needle was being advanced. No attempts were made at seeking any paresthesias. Safe injection practices and needle disposal techniques used. Medications properly checked for expiration dates. SDV (single dose vial) medications used. Description of the Procedure: Protocol guidelines were followed. Two nurses trained to do implant refills were present during the entire procedure. The refill medication was checked by both healthcare providers as well as the patient. The patient was included in the Time-out to verify the medication. The patient was placed in position. The pump was identified. The area was prepped in the usual manner. The  sterile template was positioned over the pump, making sure the side-port location matched that of the pump. Both, the pump and the template were held for stability. The needle provided in the Medtronic Kit was then introduced thru the center of the template and into the central port. The pump content was aspirated and discarded volume documented. The new  medication was slowly infused into the pump, thru the filter, making sure to avoid overpressure of the device. The needle was then removed and the area cleansed, making sure to leave some of the prepping solution back to take advantage of its long term bactericidal properties. The pump was interrogated and programmed to reflect the correct medication, volume, and dosage. The program was printed and taken to the physician for approval. Once checked and signed by the physician, a copy was provided to the patient and another scanned into the EMR.  Vitals:   09/01/24 1244  BP: 123/68  Pulse: 88  Resp: 16  Temp: 98.2 F (36.8 C)  TempSrc: Temporal  SpO2: 100%  Weight: 147 lb (66.7 kg)  Height: 4' 10.5 (1.486 m)    Start Time: 1243 hrs. End Time: 1256 hrs. Materials & Medications: Medtronic Refill Kit Medication(s): Please see chart orders for details.  Type of Imaging Technique: None used Indication(s): N/A Exposure Time: No patient exposure Contrast: None used. Fluoroscopic Guidance: N/A Ultrasound Guidance: N/A Interpretation: N/A  Antibiotic Prophylaxis:   Anti-infectives (From admission, onward)    None      Indication(s): None identified  Post-operative Assessment:  Post-procedure Vital Signs:  Pulse/HCG Rate: 88  Temp: 98.2 F (36.8 C) Resp: 16 BP: 123/68 SpO2: 100 % (2 liters)  EBL: None  Complications: No immediate post-treatment complications observed by team, or reported by patient.  Note: The patient tolerated the entire procedure well. A repeat set of vitals were taken after the procedure and the patient was kept under observation following institutional policy, for this type of procedure. Post-procedural neurological assessment was performed, showing return to baseline, prior to discharge. The patient was provided with post-procedure discharge instructions, including a section on how to identify potential problems. Should any problems arise concerning this  procedure, the patient was given instructions to immediately contact us , at any time, without hesitation. In any case, we plan to contact the patient by telephone for a follow-up status report regarding this interventional procedure.  Comments:  No additional relevant information.  Plan of Care (POC)  Orders:  Orders Placed This Encounter  Procedures   PUMP REFILL    Maintain Protocol by having two(2) healthcare providers during procedure and programming.    Scheduling Instructions:     Please refill intrathecal pump today. (09/01/2024)    Where will this procedure be performed?:   [facility]   PUMP REFILL    Whenever possible schedule on a procedure today.    Standing Status:   Future    Expiration Date:   12/30/2024    Scheduling Instructions:     Please schedule intrathecal pump refill based on pump programming. Avoid schedule intervals of more than 120 days (4 months).    Where will this procedure be performed?:   [facility]   Informed Consent Details: Physician/Practitioner Attestation; Transcribe to consent form and obtain patient signature    Transcribe to consent form and obtain patient signature.    Physician/Practitioner attestation of informed consent for procedure/surgical case:   I, the physician/practitioner, attest that I have discussed with the patient the benefits, risks,  side effects, alternatives, likelihood of achieving goals and potential problems during recovery for the procedure that I have provided informed consent.    Procedure:   Intrathecal pump refill    Physician/Practitioner performing the procedure:   Attending Physician: Naira Standiford A. Tanya, MD & designated trained staff    Indication/Reason:   Chronic Pain Syndrome (G89.4), presence of an intrathecal pump (Z97.8)     Analgesic: morphine  sulfate powder (for intrathecal pump use)  MME/day: 2.5 mg/day (Intrathecal)    Medications ordered for procedure: Meds ordered this encounter   Medications   naloxone  (NARCAN ) nasal spray 4 mg/0.1 mL    Sig: Place 1 spray into the nose as needed for up to 365 doses (for opioid-induced respiratory depresssion). In case of emergency (overdose), spray once into each nostril. If no response within 3 minutes, repeat application and call 911.    Dispense:  1 each    Refill:  1    Instruct patient in proper use of device.   Medications administered: Barnie MICAEL Ku had no medications administered during this visit.  See the medical record for exact dosing, route, and time of administration.    Interventional Therapies  Risk Factors  Considerations  Medical Comorbidities:  Eliquis  Anticoagulation: (Stop: 3 days  Restart: 6 hours)  A-Fib  Hx of PE  CHF w/ Reduced EF  Cardiomyopathy  Stage 3a CKD  COPD on O2  GERD  T2IDDM  OSA     Planned  Pending:      Under consideration:   Intrathecal pump refill and management.   Completed:   Surgery: Intrathecal pump replacement x1 (02/11/2024) by Eric Tanya, MD [facility]) Surgery: Intrathecal pump implant x1 (05/27/2017) by Lonni Charleston, MD ([facility])   Therapeutic  Palliative (PRN) options:   None established   Completed by other providers:   Surgery: Intrathecal pump replacement (05/27/2017) by Charleston Lonni LABOR, MD ([facility])      Follow-up plan:   Return for Pump Refill (Max:79mo).     Recent Visits Date Type Provider Dept  06/21/24 Procedure visit Tanya Eric, MD [facility]  Showing recent visits within past 90 days and meeting all other requirements Today's Visits Date Type Provider Dept  09/01/24 Procedure visit Tanya Eric, MD [facility]  Showing today's visits and meeting all other requirements Future Appointments Date Type Provider Dept  11/08/24 Appointment Tanya Eric, MD [facility]  Showing future appointments within next 90 days  and meeting all other requirements   Disposition: Discharge home  Discharge (Date  Time): 09/01/2024; 1310 hrs.   Primary Care Physician: [name], Jolene T, NP Location: [facility] Note by: Eric LABOR Tanya, MD (TTS technology used. I apologize for any typographical errors that were not detected and corrected.) Date: 09/01/2024; Time: 1:27 PM  Disclaimer:  Medicine is not an visual merchandiser. The only guarantee in medicine is that nothing is guaranteed. It is important to note that the decision to proceed with this intervention was based on the information collected from the patient. The Data and conclusions were drawn from the patient's questionnaire, the interview, and the physical examination. Because the information was provided in large part by the patient, it cannot be guaranteed that it has not been purposely or unconsciously manipulated. Every effort has been made to obtain as much relevant data as possible for this evaluation. It is important to note that the conclusions that lead to this procedure are derived in large  part from the available data. Always take into account that the treatment will also be dependent on availability of resources and existing treatment guidelines, considered by other Pain Management Practitioners as being common knowledge and practice, at the time of the intervention. For Medico-Legal purposes, it is also important to point out that variation in procedural techniques and pharmacological choices are the acceptable norm. The indications, contraindications, technique, and results of the above procedure should only be interpreted and judged by a Board-Certified Interventional Pain Specialist with extensive familiarity and expertise in the same exact procedure and technique.

## 2024-09-06 MED FILL — Medication: INTRATHECAL | Qty: 1 | Status: AC

## 2024-09-14 ENCOUNTER — Ambulatory Visit: Admitting: Oncology

## 2024-09-15 ENCOUNTER — Ambulatory Visit: Admitting: Physician Assistant

## 2024-09-26 ENCOUNTER — Ambulatory Visit: Admitting: Nurse Practitioner

## 2024-10-25 ENCOUNTER — Other Ambulatory Visit

## 2024-11-08 ENCOUNTER — Encounter: Admitting: Pain Medicine

## 2025-01-03 ENCOUNTER — Ambulatory Visit

## 2025-02-02 ENCOUNTER — Ambulatory Visit: Admitting: Family

## 2025-04-28 ENCOUNTER — Other Ambulatory Visit

## 2025-04-28 ENCOUNTER — Ambulatory Visit: Admitting: Oncology
